# Patient Record
Sex: Female | Born: 1945
Health system: Southern US, Community
[De-identification: ages and names within clinical notes are randomized; demographics above are authoritative.]

## PROBLEM LIST (undated history)

## (undated) DIAGNOSIS — Z87442 Personal history of urinary calculi: Secondary | ICD-10-CM

## (undated) DIAGNOSIS — F419 Anxiety disorder, unspecified: Secondary | ICD-10-CM

## (undated) DIAGNOSIS — G629 Polyneuropathy, unspecified: Secondary | ICD-10-CM

## (undated) DIAGNOSIS — E785 Hyperlipidemia, unspecified: Secondary | ICD-10-CM

## (undated) DIAGNOSIS — N840 Polyp of corpus uteri: Secondary | ICD-10-CM

## (undated) DIAGNOSIS — K219 Gastro-esophageal reflux disease without esophagitis: Secondary | ICD-10-CM

## (undated) DIAGNOSIS — G35 Multiple sclerosis: Secondary | ICD-10-CM

## (undated) DIAGNOSIS — R42 Dizziness and giddiness: Secondary | ICD-10-CM

## (undated) DIAGNOSIS — H04123 Dry eye syndrome of bilateral lacrimal glands: Secondary | ICD-10-CM

## (undated) DIAGNOSIS — R17 Unspecified jaundice: Secondary | ICD-10-CM

## (undated) DIAGNOSIS — I509 Heart failure, unspecified: Secondary | ICD-10-CM

## (undated) DIAGNOSIS — G35D Multiple sclerosis, unspecified: Secondary | ICD-10-CM

## (undated) DIAGNOSIS — I639 Cerebral infarction, unspecified: Secondary | ICD-10-CM

## (undated) DIAGNOSIS — I2510 Atherosclerotic heart disease of native coronary artery without angina pectoris: Secondary | ICD-10-CM

## (undated) DIAGNOSIS — M3500 Sicca syndrome, unspecified: Secondary | ICD-10-CM

## (undated) DIAGNOSIS — I10 Essential (primary) hypertension: Secondary | ICD-10-CM

## (undated) DIAGNOSIS — Z8719 Personal history of other diseases of the digestive system: Secondary | ICD-10-CM

## (undated) DIAGNOSIS — T7840XA Allergy, unspecified, initial encounter: Secondary | ICD-10-CM

## (undated) DIAGNOSIS — M1990 Unspecified osteoarthritis, unspecified site: Secondary | ICD-10-CM

## (undated) DIAGNOSIS — N951 Menopausal and female climacteric states: Secondary | ICD-10-CM

## (undated) DIAGNOSIS — H539 Unspecified visual disturbance: Secondary | ICD-10-CM

## (undated) HISTORY — DX: Essential (primary) hypertension: I10

## (undated) HISTORY — DX: Heart failure, unspecified: I50.9

## (undated) HISTORY — PX: UPPER GI ENDOSCOPY: SHX6162

## (undated) HISTORY — DX: Hyperlipidemia, unspecified: E78.5

## (undated) HISTORY — DX: Multiple sclerosis, unspecified: G35.D

## (undated) HISTORY — DX: Polyneuropathy, unspecified: G62.9

## (undated) HISTORY — DX: Multiple sclerosis: G35

## (undated) HISTORY — DX: Menopausal and female climacteric states: N95.1

## (undated) HISTORY — DX: Anxiety disorder, unspecified: F41.9

## (undated) HISTORY — DX: Atherosclerotic heart disease of native coronary artery without angina pectoris: I25.10

## (undated) HISTORY — DX: Unspecified osteoarthritis, unspecified site: M19.90

## (undated) HISTORY — DX: Allergy, unspecified, initial encounter: T78.40XA

## (undated) HISTORY — DX: Unspecified visual disturbance: H53.9

---

## 1999-07-22 HISTORY — PX: LUMBAR FUSION: SHX111

## 1999-07-24 ENCOUNTER — Other Ambulatory Visit: Admission: RE | Admit: 1999-07-24 | Discharge: 1999-07-24 | Payer: Self-pay | Admitting: *Deleted

## 1999-08-12 ENCOUNTER — Observation Stay (HOSPITAL_COMMUNITY): Admission: RE | Admit: 1999-08-12 | Discharge: 1999-08-13 | Payer: Self-pay | Admitting: Neurosurgery

## 1999-11-06 ENCOUNTER — Encounter: Admission: RE | Admit: 1999-11-06 | Discharge: 2000-02-04 | Payer: Self-pay | Admitting: Neurosurgery

## 1999-12-28 ENCOUNTER — Ambulatory Visit (HOSPITAL_COMMUNITY): Admission: RE | Admit: 1999-12-28 | Discharge: 1999-12-28 | Payer: Self-pay | Admitting: Neurosurgery

## 2000-01-17 ENCOUNTER — Encounter: Admission: RE | Admit: 2000-01-17 | Discharge: 2000-01-17 | Payer: Self-pay | Admitting: Neurosurgery

## 2000-07-21 HISTORY — PX: ABDOMINAL HYSTERECTOMY: SHX81

## 2001-02-25 ENCOUNTER — Other Ambulatory Visit
Admission: RE | Admit: 2001-02-25 | Discharge: 2001-02-25 | Payer: Self-pay | Admitting: Physical Medicine & Rehabilitation

## 2001-03-17 ENCOUNTER — Encounter: Payer: Self-pay | Admitting: Obstetrics and Gynecology

## 2001-03-17 ENCOUNTER — Encounter: Admission: RE | Admit: 2001-03-17 | Discharge: 2001-03-17 | Payer: Self-pay | Admitting: Obstetrics and Gynecology

## 2001-05-06 ENCOUNTER — Encounter: Payer: Self-pay | Admitting: Obstetrics and Gynecology

## 2001-05-06 ENCOUNTER — Ambulatory Visit (HOSPITAL_COMMUNITY): Admission: RE | Admit: 2001-05-06 | Discharge: 2001-05-06 | Payer: Self-pay | Admitting: Obstetrics and Gynecology

## 2001-05-25 ENCOUNTER — Ambulatory Visit (HOSPITAL_COMMUNITY): Admission: RE | Admit: 2001-05-25 | Discharge: 2001-05-25 | Payer: Self-pay | Admitting: Neurology

## 2001-06-08 ENCOUNTER — Inpatient Hospital Stay (HOSPITAL_COMMUNITY): Admission: RE | Admit: 2001-06-08 | Discharge: 2001-06-09 | Payer: Self-pay | Admitting: Obstetrics and Gynecology

## 2001-06-08 ENCOUNTER — Encounter (INDEPENDENT_AMBULATORY_CARE_PROVIDER_SITE_OTHER): Payer: Self-pay | Admitting: Specialist

## 2001-06-08 ENCOUNTER — Encounter (INDEPENDENT_AMBULATORY_CARE_PROVIDER_SITE_OTHER): Payer: Self-pay

## 2002-02-28 ENCOUNTER — Other Ambulatory Visit
Admission: RE | Admit: 2002-02-28 | Discharge: 2002-02-28 | Payer: Self-pay | Admitting: Physical Medicine & Rehabilitation

## 2002-03-09 ENCOUNTER — Ambulatory Visit (HOSPITAL_COMMUNITY): Admission: RE | Admit: 2002-03-09 | Discharge: 2002-03-09 | Payer: Self-pay | Admitting: Obstetrics and Gynecology

## 2002-03-09 ENCOUNTER — Encounter: Payer: Self-pay | Admitting: Obstetrics and Gynecology

## 2002-04-13 ENCOUNTER — Ambulatory Visit: Admission: RE | Admit: 2002-04-13 | Discharge: 2002-04-13 | Payer: Self-pay | Admitting: Gynecology

## 2002-07-06 ENCOUNTER — Ambulatory Visit: Admission: RE | Admit: 2002-07-06 | Discharge: 2002-07-06 | Payer: Self-pay | Admitting: Gynecology

## 2002-09-07 ENCOUNTER — Encounter: Payer: Self-pay | Admitting: Obstetrics and Gynecology

## 2002-09-07 ENCOUNTER — Encounter: Admission: RE | Admit: 2002-09-07 | Discharge: 2002-09-07 | Payer: Self-pay | Admitting: Obstetrics and Gynecology

## 2003-09-15 ENCOUNTER — Ambulatory Visit (HOSPITAL_COMMUNITY): Admission: RE | Admit: 2003-09-15 | Discharge: 2003-09-15 | Payer: Self-pay | Admitting: Obstetrics and Gynecology

## 2003-10-11 ENCOUNTER — Ambulatory Visit: Admission: RE | Admit: 2003-10-11 | Discharge: 2003-10-11 | Payer: Self-pay | Admitting: Gynecology

## 2004-02-20 ENCOUNTER — Encounter: Admission: RE | Admit: 2004-02-20 | Discharge: 2004-04-03 | Payer: Self-pay | Admitting: Family Medicine

## 2004-03-19 ENCOUNTER — Encounter: Admission: RE | Admit: 2004-03-19 | Discharge: 2004-06-17 | Payer: Self-pay | Admitting: Family Medicine

## 2006-02-26 ENCOUNTER — Encounter: Admission: RE | Admit: 2006-02-26 | Discharge: 2006-02-26 | Payer: Self-pay | Admitting: Obstetrics and Gynecology

## 2007-03-31 LAB — HM COLONOSCOPY

## 2007-05-28 ENCOUNTER — Encounter: Admission: RE | Admit: 2007-05-28 | Discharge: 2007-05-28 | Payer: Self-pay | Admitting: Obstetrics and Gynecology

## 2008-04-14 ENCOUNTER — Encounter: Admission: RE | Admit: 2008-04-14 | Discharge: 2008-04-14 | Payer: Self-pay | Admitting: Family Medicine

## 2008-10-27 ENCOUNTER — Encounter: Admission: RE | Admit: 2008-10-27 | Discharge: 2008-10-27 | Payer: Self-pay | Admitting: Family Medicine

## 2009-03-23 ENCOUNTER — Encounter: Admission: RE | Admit: 2009-03-23 | Discharge: 2009-03-23 | Payer: Self-pay | Admitting: Family Medicine

## 2009-04-09 LAB — CONVERTED CEMR LAB: Pap Smear: NORMAL

## 2009-07-21 HISTORY — PX: OTHER SURGICAL HISTORY: SHX169

## 2009-08-27 ENCOUNTER — Emergency Department (HOSPITAL_COMMUNITY): Admission: EM | Admit: 2009-08-27 | Discharge: 2009-08-27 | Payer: Self-pay | Admitting: Family Medicine

## 2009-08-31 ENCOUNTER — Ambulatory Visit: Payer: Self-pay | Admitting: Internal Medicine

## 2009-08-31 DIAGNOSIS — E785 Hyperlipidemia, unspecified: Secondary | ICD-10-CM | POA: Insufficient documentation

## 2009-08-31 DIAGNOSIS — J309 Allergic rhinitis, unspecified: Secondary | ICD-10-CM | POA: Insufficient documentation

## 2009-08-31 DIAGNOSIS — R42 Dizziness and giddiness: Secondary | ICD-10-CM | POA: Insufficient documentation

## 2009-08-31 DIAGNOSIS — G35 Multiple sclerosis: Secondary | ICD-10-CM | POA: Insufficient documentation

## 2009-08-31 LAB — CONVERTED CEMR LAB
ALT: 21 units/L (ref 0–35)
AST: 19 units/L (ref 0–37)
Eosinophils Relative: 1 % (ref 0.0–5.0)
GFR calc non Af Amer: 90.21 mL/min (ref >60)
Glucose, Urine, Semiquant: NEGATIVE
HCT: 41.8 % (ref 36.0–46.0)
Hemoglobin: 14.2 g/dL (ref 12.0–15.0)
Ketones, urine, test strip: NEGATIVE
Lymphs Abs: 0.8 10*3/uL (ref 0.7–4.0)
Monocytes Relative: 5.5 % (ref 3.0–12.0)
Neutro Abs: 4.3 10*3/uL (ref 1.4–7.7)
Platelets: 182 10*3/uL (ref 150.0–400.0)
Potassium: 4.1 meq/L (ref 3.5–5.1)
RBC: 4.52 M/uL (ref 3.87–5.11)
Sodium: 142 meq/L (ref 135–145)
Specific Gravity, Urine: 1.01
TSH: 3.07 microintl units/mL (ref 0.35–5.50)
Total Bilirubin: 0.4 mg/dL (ref 0.3–1.2)
WBC: 5.5 10*3/uL (ref 4.5–10.5)
pH: 7.5

## 2009-09-01 ENCOUNTER — Encounter: Payer: Self-pay | Admitting: Internal Medicine

## 2009-09-03 ENCOUNTER — Ambulatory Visit: Payer: Self-pay | Admitting: Cardiology

## 2009-09-04 ENCOUNTER — Encounter: Payer: Self-pay | Admitting: Internal Medicine

## 2009-09-04 ENCOUNTER — Telehealth: Payer: Self-pay | Admitting: Internal Medicine

## 2009-09-04 DIAGNOSIS — N200 Calculus of kidney: Secondary | ICD-10-CM | POA: Insufficient documentation

## 2009-09-05 ENCOUNTER — Ambulatory Visit: Payer: Self-pay | Admitting: Internal Medicine

## 2009-09-05 DIAGNOSIS — M5126 Other intervertebral disc displacement, lumbar region: Secondary | ICD-10-CM | POA: Insufficient documentation

## 2009-09-14 ENCOUNTER — Encounter: Payer: Self-pay | Admitting: Internal Medicine

## 2009-10-01 ENCOUNTER — Encounter: Payer: Self-pay | Admitting: Internal Medicine

## 2009-10-03 ENCOUNTER — Ambulatory Visit: Payer: Self-pay | Admitting: Internal Medicine

## 2009-10-03 DIAGNOSIS — B0229 Other postherpetic nervous system involvement: Secondary | ICD-10-CM | POA: Insufficient documentation

## 2009-10-11 ENCOUNTER — Telehealth: Payer: Self-pay | Admitting: Internal Medicine

## 2009-11-27 ENCOUNTER — Telehealth: Payer: Self-pay | Admitting: Internal Medicine

## 2009-11-29 ENCOUNTER — Ambulatory Visit: Payer: Self-pay | Admitting: Internal Medicine

## 2009-12-04 ENCOUNTER — Ambulatory Visit: Payer: Self-pay | Admitting: Internal Medicine

## 2009-12-05 ENCOUNTER — Encounter: Payer: Self-pay | Admitting: Internal Medicine

## 2009-12-05 LAB — CONVERTED CEMR LAB
ALT: 19 units/L (ref 0–35)
Bilirubin, Direct: 0.1 mg/dL (ref 0.0–0.3)
Calcium: 9.1 mg/dL (ref 8.4–10.5)
Chloride: 107 meq/L (ref 96–112)
Creatinine, Ser: 0.6 mg/dL (ref 0.4–1.2)
GFR calc non Af Amer: 99.95 mL/min (ref >60)
LDL Cholesterol: 136 mg/dL — ABNORMAL HIGH (ref 0–99)
Nitrite: NEGATIVE
Specific Gravity, Urine: 1.025 (ref 1.000–1.030)
Total Bilirubin: 0.5 mg/dL (ref 0.3–1.2)
Total CHOL/HDL Ratio: 5
Total Protein, Urine: NEGATIVE mg/dL
Triglycerides: 130 mg/dL (ref 0.0–149.0)
pH: 5.5 (ref 5.0–8.0)

## 2010-04-01 ENCOUNTER — Encounter: Payer: Self-pay | Admitting: Internal Medicine

## 2010-06-20 ENCOUNTER — Ambulatory Visit (HOSPITAL_COMMUNITY): Admission: RE | Admit: 2010-06-20 | Discharge: 2010-06-20 | Payer: Self-pay | Admitting: Internal Medicine

## 2010-08-20 NOTE — Assessment & Plan Note (Signed)
Summary: [facility]   Vital Signs:  Patient profile:   65 year old female Height:      62 inches Weight:      166 pounds BMI:     30.47 O2 Sat:      99 % on Room air Temp:     97.0 degrees F oral Pulse rate:   72 / minute Pulse rhythm:   regular Resp:     16 per minute BP sitting:   110 / 80  (left arm) Cuff size:   large  Vitals Entered By: Rock Nephew CMA (August 31, 2009 1:26 PM)  Nutrition Counseling: Patient's BMI is greater than 25 and therefore counseled on weight management options.  O2 Flow:  Room air  Primary Care Provider:  Etta Grandchild MD   History of Present Illness: New to me she complains of a 3 week hx of hematuria and intermittent right lower flank pain. She was seen at [facility] on 02/01 and was given Cipro which caused diarrhea. She then went to an [facility] on 02/07 and was given pyridium and diflucan.  Preventive Screening-Counseling & Management  Alcohol-Tobacco     Alcohol drinks/day: <1     Alcohol type: all     >5/day in last 3 mos: no     Alcohol Counseling: not indicated; use of alcohol is not excessive or problematic     Feels need to cut down: no     Feels annoyed by complaints: no     Feels guilty re: drinking: no     Needs 'eye opener' in am: no     Smoking Status: never  Caffeine-Diet-Exercise     Does Patient Exercise: no  Hep-HIV-STD-Contraception     Hepatitis Risk: no risk noted     HIV Risk: no risk noted     STD Risk: no risk noted      Sexual History:  not active.        Drug Use:  no.        Blood Transfusions:  no.    Medications Prior to Update: 1)  None  Current Medications (verified): 1)  Betaseron 0.3 Mg Solr (Interferon Beta-1b) .... Alternate Days 2)  Fexofenadine Hcl 180 Mg Tabs (Fexofenadine Hcl) .... Take 1 Tablet By Mouth Once A Day 3)  Estrace 0.5 Mg Tabs (Estradiol) .... Alternate Daily Between 0.5 and 0.1mg  4)  Clonazepam 0.5 Mg Tabs (Clonazepam) .... Take 1 Tablet By Mouth Once A  Day 5)  Colace 100 Mg Caps (Docusate Sodium) .... Daily 6)  Provigil 200 Mg Tabs (Modafinil) .... As Needed 7)  Meloxicam 7.5 Mg Tabs (Meloxicam) .... As Needed 8)  Tramadol Hcl 50 Mg Tabs (Tramadol Hcl) .... As Needed 9)  Multivitamin 10)  Calcium 600 + D 11)  Fish Oil 1000mg  12)  Ex Strength Tylenol .... 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  The patient  denies anorexia, fever, weight loss, weight gain, chest pain, dyspnea on exertion, peripheral edema, prolonged cough, hemoptysis, abdominal pain, melena, hematochezia, severe indigestion/heartburn, incontinence, genital sores, suspicious skin lesions, depression, and enlarged lymph nodes.   GI:  Complains of abdominal pain, constipation, diarrhea, and nausea; denies bloody stools, dark tarry stools, gas, indigestion, loss of appetite, vomiting, vomiting blood, and yellowish skin color. GU:  Complains of hematuria; denies abnormal vaginal bleeding, discharge, dysuria, incontinence, nocturia, urinary frequency, and urinary hesitancy.  Physical Exam  General:  alert, well-developed, well-nourished, well-hydrated, appropriate dress, normal appearance, healthy-appearing, and cooperative to examination.   Head:  normocephalic, atraumatic, no abnormalities observed, and no abnormalities palpated.   Eyes:  pink moist mm., no icterus Ears:   R ear normal and L ear normal.   Mouth:  Oral mucosa and oropharynx without lesions or exudates.  Teeth in good repair. Neck:  supple, full ROM, no masses, no thyromegaly, no JVD, no carotid bruits, and no cervical lymphadenopathy.   Lungs:  Normal respiratory effort, chest expands symmetrically. Lungs are clear to auscultation, no crackles or wheezes. Heart:  Normal rate and regular rhythm. S1 and S2 normal without gallop, murmur, click, rub or other extra sounds. Abdomen:  soft, non-tender, no distention, no masses, no guarding, no rigidity, no rebound tenderness, no abdominal hernia, no hepatomegaly, no splenomegaly, and bowel sounds hypoactive.   Msk:  No deformity or scoliosis noted of thoracic or lumbar spine.   Pulses:  R and L carotid,radial,femoral,dorsalis pedis and posterior tibial pulses are full and equal bilaterally Extremities:  No clubbing, cyanosis, edema, or deformity noted with normal full range of motion of all joints.   Neurologic:  alert & oriented X3, cranial nerves II-XII intact, abnormal gait, RUE hyperreflexia, RLE hyperreflexia, RLE weakness, LUE hyperreflexia, LUE weakness, LLE hyperreflexia, and LLE weakness.     Impression & Recommendations:  Problem # 1:  DIZZINESS (ICD-780.4) Assessment New  Her updated medication list for this problem includes:    Fexofenadine Hcl 180 Mg Tabs (Fexofenadine hcl) .[name] Kitchen... Take 1 tablet by mouth once a day  Orders: Venipuncture (57846) TLB-BMP (Basic Metabolic Panel-BMET) (80048-METABOL) TLB-CBC Platelet - w/Differential (85025-CBCD) TLB-Hepatic/Liver Function Pnl (80076-HEPATIC) TLB-TSH (Thyroid Stimulating Hormone) (84443-TSH) T-Urine Culture (Spectrum Order) ([id number]) TLB-Lipase (83690-LIPASE) TLB-Sedimentation Rate (ESR) (85652-ESR)  Problem # 2:  GROSS HEMATURIA (ICD-599.71) Assessment: New will culture to look for infection, she will increase fluid intake Orders: Venipuncture (24401) TLB-BMP (Basic Metabolic  Panel-BMET) (80048-METABOL) TLB-CBC Platelet - w/Differential (85025-CBCD) TLB-Hepatic/Liver Function Pnl (80076-HEPATIC) TLB-TSH (Thyroid Stimulating Hormone) (84443-TSH) T-Urine Culture (Spectrum Order) ([id number]) TLB-Lipase (83690-LIPASE) TLB-Sedimentation Rate (ESR) (85652-ESR) UA Dipstick W/ Micro (manual) (03474) Radiology Referral (Radiology)  Problem # 3:  FLANK PAIN, RIGHT (ICD-789.09) Assessment: New i think she may have a kidney stone so will start the workup, she did not want anything for pain today. Her updated medication list for this problem includes:    Meloxicam 7.5 Mg Tabs (Meloxicam) .[name] Kitchen... As needed    Tramadol Hcl 50 Mg Tabs (Tramadol hcl) .[name] Kitchen... As needed  Orders: Venipuncture (25956) TLB-BMP (Basic Metabolic Panel-BMET) (80048-METABOL) TLB-CBC Platelet - w/Differential (85025-CBCD) TLB-Hepatic/Liver Function Pnl (80076-HEPATIC) TLB-TSH (Thyroid Stimulating Hormone) (84443-TSH) T-Urine Culture (Spectrum Order) [id number]) TLB-Lipase (83690-LIPASE) TLB-Sedimentation Rate (ESR) (85652-ESR) UA Dipstick W/ Micro (manual) (51884) Radiology Referral (Radiology)  Complete Medication List: 1)  Betaseron 0.3 Mg Solr (Interferon beta-1b) .... Alternate days 2)  Fexofenadine Hcl 180 Mg Tabs (Fexofenadine hcl) .... Take 1 tablet by mouth once a day 3)  Estrace 0.5 Mg Tabs (  Estradiol) .... Alternate daily between 0.5 and 0.1mg  4)  Clonazepam 0.5 Mg Tabs (Clonazepam) .... Take 1 tablet by mouth once a day 5)  Colace 100 Mg Caps (Docusate sodium) .... Daily 6)  Provigil 200 Mg Tabs (Modafinil) .... As needed 7)  Meloxicam 7.5 Mg Tabs (Meloxicam) .... As needed 8)  Tramadol Hcl 50 Mg Tabs (Tramadol hcl) .... As needed 9)  Multivitamin  10)  Calcium 600 + D  11)  Fish Oil 1000mg   12)  Ex Strength Tylenol  .... As needed  Colorectal Screening:  Colonoscopy Results:    Date of Exam: 03/31/2007    Results: Adenomatous Polyp  PAP Screening:    Hx Cervical  Dysplasia in last 5 yrs? No    3 normal PAP smears in last 5 yrs? Yes    Last PAP smear:  04/09/2009  PAP Smear Results:    Date of Exam:  04/09/2009    Results:  Normal  Mammogram Screening:    Last Mammogram:  04/10/2009  Mammogram Results:    Date of Exam:  04/10/2009    Results:  Normal Bilateral  Osteoporosis Risk Assessment:  Risk Factors for Fracture or Low Bone Density:   Race (White or Asian):     yes   Smoking status:       never  Immunization & Chemoprophylaxis:    Influenza vaccine: Historical  (03/21/2009)    Pneumovax: Historical  (03/21/2009)  Patient Instructions: 1)  Please schedule a follow-up appointment in 3-5 days.    Immunization History:  Influenza Immunization History:    Influenza:  historical (03/21/2009)  Pneumovax Immunization History:    Pneumovax:  historical (03/21/2009)   Laboratory Results   Urine Tests  Date/Time Received: Rock Nephew CMA  August 31, 2009 1:43 PM   Routine Urinalysis   Color: lt. yellow Appearance: Clear Glucose: negative   (Normal Range: Negative) Bilirubin: negative   (Normal Range: Negative) Ketone: negative   (Normal Range: Negative) Spec. Gravity: 1.010   (Normal Range: 1.003-1.035) Blood: trace-lysed   (Normal Range: Negative) pH: 7.5   (Normal Range: 5.0-8.0) Protein: negative   (Normal Range: Negative) Urobilinogen: 0.2   (Normal Range: 0-1) Nitrite: negative   (Normal Range: Negative) Leukocyte Esterace: negative   (Normal Range: Negative)

## 2010-08-20 NOTE — Letter (Signed)
Summary: Results Follow-up Letter  [facility]  [address] [place], [place] [address]   Phone: [phone]  Fax: [phone]    12/05/2009  [address] [place], [place]  [address]  Dear Ms. Whatley,   The following are the results of your recent test(s):  Test     Result     Liver/kidney   normal Thyroid     normal  _________________________________________________________  Please call for an appointment as directed _________________________________________________________ _________________________________________________________ _________________________________________________________  Sincerely,  Sanda Linger MD [facility]

## 2010-08-20 NOTE — Assessment & Plan Note (Signed)
Summary: PER PT ZOSTAVAX--STC  Nurse Visit   Vitals Entered By: Rock Nephew CMA (Nov 29, 2009 9:45 AM) CC: zostavax injection   Allergies: No Known Drug Allergies  Immunizations Administered:  Zostavax # 1:    Vaccine Type: Zostavax    Site: right deltoid    Mfr: Merck    Dose: 0.65    Route: [place]    Given by: Rock Nephew CMA    Exp. Date: 11/25/2010    Lot #: [id number]    VIS given: 05/02/05 given Nov 29, 2009.  Orders Added: 1)  Zoster (Shingles) Vaccine Live [90736] 2)  Admin 1st Vaccine [phone]

## 2010-08-20 NOTE — Assessment & Plan Note (Signed)
Summary: FU ON KIDNEY STONES PER [facility]  #   Vital Signs:  Patient profile:   65 year old female Height:      62 inches Weight:      171.50 pounds BMI:     31.48 O2 Sat:      97 % on Room air Temp:     97.2 degrees F oral Pulse rate:   71 / minute Pulse rhythm:   regular Resp:     16 per minute BP sitting:   106 / 64  (left arm) Cuff size:   large  Vitals Entered By: Rock Nephew CMA (October 03, 2009 9:34 AM)  Nutrition Counseling: Patient's BMI is greater than 25 and therefore counseled on weight management options.  O2 Flow:  Room air CC: follow-up visit Is Patient Diabetic? No   Primary Care Provider:  Etta Grandchild MD  CC:  follow-up visit.  History of Present Illness: She returns with a new complaint, she developed a painful rash on her right/lateral/upper chest wall 2 weeks ago. The rash has resolved and she has brown spots there now but the area stills hurts with a pain that she describes as a "twitchy spasm" that worsens at night when she is trying to sleep.  She has been seen by Dr. Vernie Ammons (Urology) and she says that a CTscan, urine tests, and cystoscopy have all been negative for cancer but they decided her hematuria was from the stones and that no further treamtent should be considered at this time. She says that she was instructed to notify them of any symptoms.  Preventive Screening-Counseling & Management  Alcohol-Tobacco     Alcohol drinks/day: <1     Alcohol type: all     >5/day in last 3 mos: no     Alcohol Counseling: not indicated; use of alcohol is not excessive or problematic     Feels need to cut down: no     Feels annoyed by complaints: no     Feels guilty re: drinking: no     Needs 'eye opener' in am: no     Smoking Status: never  Hep-HIV-STD-Contraception     Hepatitis Risk: no risk noted     HIV Risk: no risk noted     STD Risk: no risk noted      Sexual History:  not active.        Drug Use:  no.        Blood  Transfusions:  no.    Clinical Review Panels:  CBC   WBC:  5.5 (08/31/2009)   RBC:  4.52 (08/31/2009)   Hgb:  14.2 (08/31/2009)   Hct:  41.8 (08/31/2009)   Platelets:  182.0 (08/31/2009)   MCV  92.5 (08/31/2009)   MCHC  33.9 (08/31/2009)   RDW  13.6 (08/31/2009)   PMN:  78.7 (08/31/2009)   Lymphs:  14.8 (08/31/2009)   Monos:  5.5 (08/31/2009)   Eosinophils:  1.0 (08/31/2009)   Basophil:  0.0 (08/31/2009)  Complete Metabolic Panel   Glucose:  93 (08/31/2009)   Sodium:  142 (08/31/2009)   Potassium:  4.1 (08/31/2009)   Chloride:  106 (08/31/2009)   CO2:  30 (08/31/2009)   BUN:  14 (08/31/2009)   Creatinine:  0.7 (08/31/2009)   Albumin:  4.1 (08/31/2009)   Total Protein:  6.8 (08/31/2009)   Calcium:  9.4 (08/31/2009)   Total Bili:  0.4 (08/31/2009)   Alk Phos:  91 (08/31/2009)   SGPT (ALT):  21 (08/31/2009)  SGOT (AST):  19 (08/31/2009)   Medications Prior to Update: 1)  Betaseron 0.3 Mg Solr (Interferon Beta-1b) .... Alternate Days 2)  Fexofenadine Hcl 180 Mg Tabs (Fexofenadine Hcl) .... Take 1 Tablet By Mouth Once A Day 3)  Estrace 0.5 Mg Tabs (Estradiol) .... Alternate Daily Between 0.5 and 0.1mg  4)  Clonazepam 0.5 Mg Tabs (Clonazepam) .... Take 1 Tablet By Mouth Once A Day 5)  Colace 100 Mg Caps (Docusate Sodium) .... Daily 6)  Provigil 200 Mg Tabs (Modafinil) .... As Needed 7)  Meloxicam 7.5 Mg Tabs (Meloxicam) .... As Needed 8)  Tramadol Hcl 50 Mg Tabs (Tramadol Hcl) .... As Needed 9)  Multivitamin 10)  Calcium 600 + D 11)  Fish Oil 1000mg  12)  Ex Strength Tylenol .... As Needed  Current Medications (verified): 1)  Betaseron 0.3 Mg Solr (Interferon Beta-1b) .... Alternate Days 2)  Fexofenadine Hcl 180 Mg Tabs (Fexofenadine Hcl) .... Take 1 Tablet By Mouth Once A Day 3)  Estrace 0.5 Mg Tabs (Estradiol) .... Alternate Daily Between 0.5 and 0.1mg  4)  Clonazepam 0.5 Mg Tabs (Clonazepam) .... Take 1 Tablet By Mouth Once A Day 5)  Colace 100 Mg Caps (Docusate  Sodium) .... Daily 6)  Provigil 200 Mg Tabs (Modafinil) .... As Needed 7)  Meloxicam 7.5 Mg Tabs (Meloxicam) .... As Needed 8)  Tramadol Hcl 50 Mg Tabs (Tramadol Hcl) .... As Needed 9)  Multivitamin 10)  Calcium 600 + D 11)  Fish Oil 1000mg  12)  Ex Strength Tylenol .... As Needed 13)  Lyrica 50 Mg Caps (Pregabalin) .... One By Mouth Three Times A Day As Needed For Shingles Pain  Allergies (verified): No Known Drug Allergies  Past History:  Past Medical History: Reviewed history from 08/31/2009 and no changes required. MS Allergic rhinitis Hyperlipidemia  Past Surgical History: Reviewed history from 08/31/2009 and no changes required. Hysterectomy Lumbar fusion  Family History: Reviewed history from 08/31/2009 and no changes required. 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  The patient denies anorexia, fever, weight loss, prolonged cough, hemoptysis, abdominal pain, enlarged lymph nodes, and angioedema.   General:  Denies chills, fatigue, fever, loss of appetite, malaise, sleep disorder, sweats, weakness, and weight loss. GU:  Denies dysuria, hematuria, incontinence, nocturia, urinary frequency, and urinary hesitancy. Derm:  Complains of lesion(s) and rash; denies changes in color of skin, changes in nail beds, dryness, excessive perspiration, flushing, insect bite(s), itching, and poor wound healing.  Physical Exam  General:  alert, well-developed, well-nourished, well-hydrated, appropriate dress, normal appearance, healthy-appearing, and cooperative to examination.   Head:  normocephalic, atraumatic, no abnormalities observed, and no abnormalities palpated.   Mouth:  Oral mucosa and oropharynx without lesions or  exudates.  Teeth in good repair. Neck:  supple, full ROM, no masses, no thyromegaly, no JVD, no carotid bruits, and no cervical lymphadenopathy.   Lungs:  Normal respiratory effort, chest expands symmetrically. Lungs are clear to auscultation, no crackles or wheezes. Heart:  Normal rate and regular rhythm. S1 and S2 normal without gallop, murmur, click, rub or other extra sounds. Abdomen:  soft, non-tender, no distention, no masses, no guarding, no rigidity, no rebound tenderness, no abdominal hernia, no hepatomegaly, no splenomegaly, and bowel sounds hypoactive.   Msk:  No deformity or scoliosis noted of thoracic  or lumbar spine.   Pulses:  R and L carotid,radial,femoral,dorsalis pedis and posterior tibial pulses are full and equal bilaterally Extremities:  No clubbing, cyanosis, edema, or deformity noted with normal full range of motion of all joints.   Neurologic:  alert & oriented X3, cranial nerves II-XII intact, abnormal gait, RUE hyperreflexia, RLE hyperreflexia, RLE weakness, LUE hyperreflexia, LUE weakness, LLE hyperreflexia, and LLE weakness.   Skin:  On her right upper/lateral chest wall she has a group of slightly hyperpigmented papules with some coalescence that follow a dermatomal pattern. there is no evidence of secondary infection. Cervical Nodes:  no anterior cervical adenopathy and no posterior cervical adenopathy.   Axillary Nodes:  no R axillary adenopathy and no L axillary adenopathy.   Inguinal Nodes:  no R inguinal adenopathy and no L inguinal adenopathy.   Psych:  Cognition and judgment appear intact. Alert and cooperative with normal attention span and concentration. No apparent delusions, illusions, hallucinations   Impression & Recommendations:  Problem # 1:  POSTHERPETIC NEURALGIA (ICD-053.19) Assessment New try Lyrica  Problem # 2:  RENAL CALCULUS, RECURRENT (ICD-592.0) Assessment: Unchanged  Complete Medication List: 1)  Betaseron 0.3 Mg Solr (Interferon  beta-1b) .... Alternate days 2)  Fexofenadine Hcl 180 Mg Tabs (Fexofenadine hcl) .... Take 1 tablet by mouth once a day 3)  Estrace 0.5 Mg Tabs (Estradiol) .... Alternate daily between 0.5 and 0.1mg  4)  Clonazepam 0.5 Mg Tabs (Clonazepam) .... Take 1 tablet by mouth once a day 5)  Colace 100 Mg Caps (Docusate sodium) .... Daily 6)  Provigil 200 Mg Tabs (Modafinil) .... As needed 7)  Meloxicam 7.5 Mg Tabs (Meloxicam) .... As needed 8)  Tramadol Hcl 50 Mg Tabs (Tramadol hcl) .... As needed 9)  Multivitamin  10)  Calcium 600 + D  11)  Fish Oil 1000mg   12)  Ex Strength Tylenol  .... As needed 13)  Lyrica 50 Mg Caps (Pregabalin) .... One by mouth three times a day as needed for shingles pain  Patient Instructions: 1)  Please schedule a follow-up appointment in 1 month. 2)  Drink at least two extra quarts of water every day to prevent kidney stone formation. Prescriptions: LYRICA 50 MG CAPS (PREGABALIN) One by mouth three times a day as needed for shingles pain  #63 x 0   Entered and Authorized by:   Etta Grandchild MD   Signed by:   Etta Grandchild MD on 10/03/2009   Method used:   Samples Given   RxID:   [id number]

## 2010-08-20 NOTE — Assessment & Plan Note (Signed)
Summary: FU ON RESULTS ON KIDNEY STONES/NWS   #   Vital Signs:  Patient profile:   65 year old female Height:      62 inches Weight:      169 pounds BMI:     31.02 O2 Sat:      97 % on Room air Temp:     97.2 degrees F oral Pulse rate:   67 / minute Pulse rhythm:   regular Resp:     16 per minute BP sitting:   98 / 56  (left arm) Cuff size:   large  Vitals Entered By: Rock Nephew CMA (September 05, 2009 9:41 AM)  Nutrition Counseling: Patient's BMI is greater than 25 and therefore counseled on weight management options.  O2 Flow:  Room air CC: test result follow up   Primary Care Provider:  Etta Grandchild MD  CC:  test result follow up.  History of Present Illness: She returns for f/up and has persistent pain but moreso in the right thigh than elsewhere and unchanged LBP with no new N/W/T in her legs. The hematuria has resolved. The CT scan showed small bilat. renal stones and a bulging disc at L4L5.  Preventive Screening-Counseling & Management  Alcohol-Tobacco     Alcohol drinks/day: <1     Alcohol type: all     >5/day in last 3 mos: no     Alcohol Counseling: not indicated; use of alcohol is not excessive or problematic     Feels need to cut down: no     Feels annoyed by complaints: no     Feels guilty re: drinking: no     Needs 'eye opener' in am: no     Smoking Status: never  Hep-HIV-STD-Contraception     Hepatitis Risk: no risk noted     HIV Risk: no risk noted     STD Risk: no risk noted  Clinical Review Panels:  Diabetes Management   Creatinine:  0.7 (08/31/2009)   Last Flu Vaccine:  Historical (03/21/2009)   Last Pneumovax:  Historical (03/21/2009)  CBC   WBC:  5.5 (08/31/2009)   RBC:  4.52 (08/31/2009)   Hgb:  14.2 (08/31/2009)   Hct:  41.8 (08/31/2009)   Platelets:  182.0 (08/31/2009)   MCV  92.5 (08/31/2009)   MCHC  33.9 (08/31/2009)   RDW  13.6 (08/31/2009)   PMN:  78.7 (08/31/2009)   Lymphs:  14.8 (08/31/2009)   Monos:  5.5  (08/31/2009)   Eosinophils:  1.0 (08/31/2009)   Basophil:  0.0 (08/31/2009)  Complete Metabolic Panel   Glucose:  93 (08/31/2009)   Sodium:  142 (08/31/2009)   Potassium:  4.1 (08/31/2009)   Chloride:  106 (08/31/2009)   CO2:  30 (08/31/2009)   BUN:  14 (08/31/2009)   Creatinine:  0.7 (08/31/2009)   Albumin:  4.1 (08/31/2009)   Total Protein:  6.8 (08/31/2009)   Calcium:  9.4 (08/31/2009)   Total Bili:  0.4 (08/31/2009)   Alk Phos:  91 (08/31/2009)   SGPT (ALT):  21 (08/31/2009)   SGOT (AST):  19 (08/31/2009)   Current Medications (verified): 1)  Betaseron 0.3 Mg Solr (Interferon Beta-1b) .... Alternate Days 2)  Fexofenadine Hcl 180 Mg Tabs (Fexofenadine Hcl) .... Take 1 Tablet By Mouth Once A Day 3)  Estrace 0.5 Mg Tabs (Estradiol) .... Alternate Daily Between 0.5 and 0.1mg  4)  Clonazepam 0.5 Mg Tabs (Clonazepam) .... Take 1 Tablet By Mouth Once A Day 5)  Colace 100 Mg Caps (Docusate  Sodium) .... Daily 6)  Provigil 200 Mg Tabs (Modafinil) .... As Needed 7)  Meloxicam 7.5 Mg Tabs (Meloxicam) .... As Needed 8)  Tramadol Hcl 50 Mg Tabs (Tramadol Hcl) .... As Needed 9)  Multivitamin 10)  Calcium 600 + D 11)  Fish Oil 1000mg  12)  Ex Strength Tylenol .... As Needed  Allergies (verified): No Known Drug Allergies  Past History:  Past Medical History: Reviewed history from 08/31/2009 and no changes required. MS Allergic rhinitis Hyperlipidemia  Past Surgical History: Reviewed history from 08/31/2009 and no changes required. Hysterectomy Lumbar fusion  Family History: Reviewed history from 08/31/2009 and no changes required. 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 Retired Conservation officer, nature Never Smoked Alcohol use-yes Drug use-no Regular exercise-no  Review of Systems       The patient complains of abdominal pain.  The patient denies anorexia, fever, weight loss, chest  pain, hemoptysis, hematuria, muscle weakness, suspicious skin lesions, difficulty walking, and enlarged lymph nodes.   General:  Denies chills, fatigue, fever, loss of appetite, and malaise. GU:  Denies discharge, dysuria, hematuria, nocturia, urinary frequency, and urinary hesitancy. MS:  Complains of low back pain; denies joint pain, joint redness, and joint swelling.  Physical Exam  General:  alert, well-developed, well-nourished, well-hydrated, appropriate dress, normal appearance, healthy-appearing, and cooperative to examination.   Head:  normocephalic, atraumatic, no abnormalities observed, and no abnormalities palpated.   Mouth:  Oral mucosa and oropharynx without lesions or exudates.  Teeth in good repair. Neck:  supple, full ROM, no masses, no thyromegaly, no JVD, no carotid bruits, and no cervical lymphadenopathy.   Lungs:  Normal respiratory effort, chest expands symmetrically. Lungs are clear to auscultation, no crackles or wheezes. Heart:  Normal rate and regular rhythm. S1 and S2 normal without gallop, murmur, click, rub or other extra sounds. Abdomen:  soft, non-tender, no distention, no masses, no guarding, no rigidity, no rebound tenderness, no abdominal hernia, no hepatomegaly, no splenomegaly, and bowel sounds hypoactive.   Msk:  No deformity or scoliosis noted of thoracic or lumbar spine.   Pulses:  R and L carotid,radial,femoral,dorsalis pedis and posterior tibial pulses are full and equal bilaterally Extremities:  No clubbing, cyanosis, edema, or deformity noted with normal full range of motion of all joints.   Neurologic:  alert & oriented X3, cranial nerves II-XII intact, abnormal gait, RUE hyperreflexia, RLE hyperreflexia, RLE weakness, LUE hyperreflexia, LUE weakness, LLE hyperreflexia, and LLE weakness.   Skin:  Intact without suspicious lesions or rashes Axillary Nodes:  no R axillary adenopathy and no L axillary adenopathy.   Inguinal Nodes:  no R inguinal adenopathy  and no L inguinal adenopathy.   Psych:  Cognition and judgment appear intact. Alert and cooperative with normal attention span and concentration. No apparent delusions, illusions, hallucinations   Impression & Recommendations:  Problem # 1:  RENAL CALCULUS, RECURRENT (ICD-592.0) Assessment Improved UROL. 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 Alternate days 2)  Fexofenadine Hcl 180 Mg Tabs (Fexofenadine hcl) .... Take 1 tablet by mouth once a day 3)  Estrace 0.5 Mg Tabs (Estradiol) .... Alternate daily between 0.5 and 0.1mg  4)  Clonazepam 0.5 Mg Tabs (Clonazepam) .... Take 1 tablet by mouth once a day 5)  Colace 100 Mg Caps (Docusate sodium) .... Daily 6)  Provigil 200 Mg Tabs (Modafinil) .... As needed 7)  Meloxicam 7.5 Mg Tabs (Meloxicam) .... As needed 8)  Tramadol Hcl 50 Mg Tabs (Tramadol hcl) .... As needed 9)  Multivitamin  10)  Calcium 600 + D  11)  Fish Oil 1000mg   12)  Ex Strength Tylenol  .... As needed  Patient Instructions: 1)  Please schedule a follow-up appointment in 3 months. 2)  Drink at least two extra quarts of water every day to prevent kidney stone formation.

## 2010-08-20 NOTE — Assessment & Plan Note (Signed)
Summary: 3 MONTH FOLLOW UP-LB   Vital Signs:  Patient profile:   65 year old female Height:      62 inches Weight:      166 pounds BMI:     30.47 O2 Sat:      98 % on Room air Temp:     97.1 degrees F oral Pulse rate:   67 / minute Pulse rhythm:   regular Resp:     16 per minute BP sitting:   108 / 60  (left arm)  Vitals Entered By: Lamar Sprinkles, CMA (Dec 04, 2009 9:49 AM)  Nutrition Counseling: Patient's BMI is greater than 25 and therefore counseled on weight management options.  O2 Flow:  Room air CC: f/u - would like to discuss cholesterol. Has had joint pain w/crestor in the past, not currently on medication for cholesterol.  Comments Patient unsure of last tetanus. Need to review records from previous PCP.    Primary Care Provider:  Etta Grandchild MD  CC:  f/u - would like to discuss cholesterol. Has had joint pain w/crestor in the past and not currently on medication for cholesterol. [name] Kitchen  History of Present Illness: She returns for f/up and wants to see if she needs to take a statin. She has tried Crestor in the past and felt poorly on it with myalgias and arthralgias. She does not fell like she has any extra risk factors for MI or CVA.  Current Medications (verified): 1)  Betaseron 0.3 Mg Solr (Interferon Beta-1b) .... Alternate Days 2)  Fexofenadine Hcl 180 Mg Tabs (Fexofenadine Hcl) .... Take 1 Tablet By Mouth Once A Day 3)  Estrace 0.5 Mg Tabs (Estradiol) .... Every Other Day 4)  Clonazepam 0.5 Mg Tabs (Clonazepam) .... Take 1 Tablet By Mouth Once A Day 5)  Colace 100 Mg Caps (Docusate Sodium) .... Daily 6)  Provigil 200 Mg Tabs (Modafinil) .... 1/2 To 1 Daily As Needed 7)  Meloxicam 7.5 Mg Tabs (Meloxicam) .[name] Kitchen.. 1 Once Daily As Needed 8)  Tramadol Hcl 50 Mg Tabs (Tramadol Hcl) .... Three Times A Day As Needed 9)  Multivitamin 10)  Calcium 600 + D 11)  Fish Oil 1000mg  12)  Ex Strength Tylenol .... As Needed  Allergies (verified): No Known Drug  Allergies  Past History:  Past Medical History: Reviewed history from 08/31/2009 and no changes required. MS Allergic rhinitis Hyperlipidemia  Past Surgical History: Reviewed history from 08/31/2009 and no changes required. Hysterectomy Lumbar fusion  Family History: Reviewed history from 08/31/2009 and no changes required. 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   GU:  Denies dysuria, hematuria, incontinence, nocturia, urinary frequency, and urinary hesitancy.  Physical Exam  General:  alert, well-developed, well-nourished, well-hydrated, appropriate dress, normal appearance, healthy-appearing, and cooperative to examination.   Head:  normocephalic, atraumatic, no abnormalities observed, and no abnormalities palpated.   Mouth:  Oral mucosa and oropharynx without lesions or exudates.  Teeth in good repair. Neck:  supple, full ROM, no masses, no thyromegaly, no JVD, no carotid bruits, and no cervical lymphadenopathy.   Lungs:  Normal respiratory effort, chest expands symmetrically. Lungs are clear to auscultation, no crackles or wheezes. Heart:  Normal rate and regular  rhythm. S1 and S2 normal without gallop, murmur, click, rub or other extra sounds. Abdomen:  soft, non-tender, no distention, no masses, no guarding, no rigidity, no rebound tenderness, no abdominal hernia, no hepatomegaly, no splenomegaly, and bowel sounds hypoactive.   Msk:  No deformity or scoliosis noted of thoracic or lumbar spine.   Pulses:  R and L carotid,radial,femoral,dorsalis pedis and posterior tibial pulses are  full and equal bilaterally Extremities:  No clubbing, cyanosis, edema, or deformity noted with normal full range of motion of all joints.   Neurologic:  alert & oriented X3, cranial nerves II-XII intact, abnormal gait, RUE hyperreflexia, RLE hyperreflexia, RLE weakness, LUE hyperreflexia, LUE weakness, LLE hyperreflexia, and LLE weakness.   Skin:  On her right upper/lateral chest wall she has a group of slightly hyperpigmented papules with some coalescence that follow a dermatomal pattern. there is no evidence of secondary infection. Cervical Nodes:  no anterior cervical adenopathy and no posterior cervical adenopathy.   Axillary Nodes:  no R axillary adenopathy and no L axillary adenopathy.   Psych:  Cognition and judgment appear intact. Alert and cooperative with normal attention span and concentration. No apparent delusions, illusions, hallucinations   Impression & Recommendations:  Problem # 1:  ENCOUNTER FOR LONG-TERM USE OF OTHER MEDICATIONS (ICD-V58.69) Assessment Unchanged  Orders: Venipuncture (41324) TLB-Lipid Panel (80061-LIPID) TLB-BMP (Basic Metabolic Panel-BMET) (80048-METABOL) TLB-Hepatic/Liver Function Pnl (80076-HEPATIC) TLB-TSH (Thyroid Stimulating Hormone) (84443-TSH) TLB-Udip w/ Micro (81001-URINE)  Problem # 2:  HYPERLIPIDEMIA (ICD-272.4) Assessment: Unchanged  Orders: Venipuncture (40102) TLB-Lipid Panel (80061-LIPID) TLB-BMP (Basic Metabolic Panel-BMET) (80048-METABOL) TLB-Hepatic/Liver Function Pnl (80076-HEPATIC) TLB-TSH (Thyroid Stimulating Hormone) (84443-TSH) TLB-Udip w/ Micro (81001-URINE)  Problem # 3:  RENAL CALCULUS, RECURRENT (ICD-592.0) Assessment: Improved  Orders: Venipuncture (72536) TLB-Lipid Panel (80061-LIPID) TLB-BMP (Basic Metabolic Panel-BMET) (80048-METABOL) TLB-Hepatic/Liver Function Pnl (80076-HEPATIC) TLB-TSH (Thyroid Stimulating Hormone) (84443-TSH) TLB-Udip w/ Micro (81001-URINE)  Complete Medication List: 1)  Betaseron 0.3  Mg Solr (Interferon beta-1b) .... Alternate days 2)  Fexofenadine Hcl 180 Mg Tabs (Fexofenadine hcl) .... Take 1 tablet by mouth once a day 3)  Estrace 0.5 Mg Tabs (Estradiol) .... Every other day 4)  Clonazepam 0.5 Mg Tabs (Clonazepam) .... Take 1 tablet by mouth once a day 5)  Colace 100 Mg Caps (Docusate sodium) .... Daily 6)  Provigil 200 Mg Tabs (Modafinil) .... 1/2 to 1 daily as needed 7)  Meloxicam 7.5 Mg Tabs (Meloxicam) .[name] Kitchen.. 1 once daily as needed 8)  Tramadol Hcl 50 Mg Tabs (Tramadol hcl) .... Three times a day as needed 9)  Multivitamin  10)  Calcium 600 + D  11)  Fish Oil 1000mg   12)  Ex Strength Tylenol  .... As needed  Other Orders: Tdap => 22yrs IM (64403) Admin 1st Vaccine (47425)  Patient Instructions: 1)  Please schedule a follow-up appointment in 2 months. 2)  It is important that you exercise regularly at least 20 minutes 5 times a week. If you develop chest pain, have severe difficulty breathing, or feel very tired , stop exercising immediately and seek medical attention. 3)  You need to lose weight. Consider a lower calorie diet and regular exercise.  4)  Drink at least two extra quarts of water every day to prevent kidney stone formation.   Immunizations Administered:  Tetanus Vaccine:    Vaccine Type: Tdap    Site: right deltoid    Mfr: GlaxoSmithKline    Dose: 0.5 ml    Route: IM    Given by: Lucious Groves    Exp. Date:  10/13/2011    Lot #: [id number]    VIS given: 06/08/07 version given Dec 04, 2009.

## 2010-08-20 NOTE — Letter (Signed)
Summary: [facility]  [facility]   Imported By: Lennie Odor 04/08/2010 16:14:09  _____________________________________________________________________  External Attachment:    Type:   Image     Comment:   External Document

## 2010-08-20 NOTE — Letter (Signed)
Summary: Lipid Letter  [facility]  [address] [place], [place] [address]   Phone: [phone]  Fax: [phone]    12/05/2009  Dawn [name] [address] [place], [place]  [address]  Dear Dawn [name]:  We have carefully reviewed your last lipid profile from 12/05/2009 and the results are noted below with a summary of recommendations for lipid management.    Cholesterol:       199     Goal: <200   HDL "good" Cholesterol:   57.84     Goal: >40   LDL "bad" Cholesterol:   136     Goal: <130   Triglycerides:       130.0     Goal: <150    these results are acceptable, at this point I do not recommend a statin.    TLC Diet (Therapeutic Lifestyle Change): Saturated Fats & Transfatty acids should be kept < 7% of total calories ***Reduce Saturated Fats Polyunstaurated Fat can be up to 10% of total calories Monounsaturated Fat Fat can be up to 20% of total calories Total Fat should be no greater than 25-35% of total calories Carbohydrates should be 50-60% of total calories Protein should be approximately 15% of total calories Fiber should be at least 20-30 grams a day ***Increased fiber may help lower LDL Total Cholesterol should be < 200mg /day Consider adding plant stanol/sterols to diet (example: Benacol spread) ***A higher intake of unsaturated fat may reduce Triglycerides and Increase HDL    Adjunctive Measures (may lower LIPIDS and reduce risk of Heart Attack) include: Aerobic Exercise (20-30 minutes 3-4 times a week) Limit Alcohol Consumption Weight Reduction Aspirin 75-81 mg a day by mouth (if not allergic or contraindicated) Dietary Fiber 20-30 grams a day by mouth     Current Medications: 1)    Betaseron 0.3 Mg Solr (Interferon beta-1b) .... Alternate days 2)    Fexofenadine Hcl 180 Mg Tabs (Fexofenadine hcl) .... Take 1 tablet by mouth once a day 3)    Estrace 0.5 Mg Tabs (Estradiol) .... Every other day 4)    Clonazepam 0.5 Mg Tabs  (Clonazepam) .... Take 1 tablet by mouth once a day 5)    Colace 100 Mg Caps (Docusate sodium) .... Daily 6)    Provigil 200 Mg Tabs (Modafinil) .... 1/2 to 1 daily as needed 7)    Meloxicam 7.5 Mg Tabs (Meloxicam) .[name] Kitchen.. 1 once daily as needed 8)    Tramadol Hcl 50 Mg Tabs (Tramadol hcl) .... Three times a day as needed 9)    Multivitamin  10)    Calcium 600 + D  11)    Fish Oil 1000mg   12)    Ex Strength Tylenol  .... As needed  If you have any questions, please call. We appreciate being able to work with you.   Sincerely,    [facility] Etta Grandchild MD

## 2010-08-20 NOTE — Letter (Signed)
Summary: [facility]   Imported By: Sherian Rein 10/12/2009 10:16:19  _____________________________________________________________________  External Attachment:    Type:   Image     Comment:   External Document

## 2010-08-20 NOTE — Progress Notes (Signed)
Summary: Lyrica  Phone Note Call from Patient Call back at Home Phone [phone]   Summary of Call: Patient states that Lyrica is to strong. Patient would like to know if there is an alternative? Patient also inquired about vaccine. (none available at this time). Initial call taken by: Lucious Groves,  October 11, 2009 1:23 PM  Follow-up for Phone Call        done Follow-up by: Etta Grandchild MD,  October 11, 2009 1:25 PM  Additional Follow-up for Phone Call Additional follow up Details #1::        Patient notified. Patient also made aware that Zosta vaccine is on back order from the company. Additional Follow-up by: Lucious Groves,  October 11, 2009 1:29 PM    New/Updated Medications: NEURONTIN 100 MG CAPS (GABAPENTIN) One by mouth three times a day for nerve pain Prescriptions: NEURONTIN 100 MG CAPS (GABAPENTIN) One by mouth three times a day for nerve pain  #90 x 3   Entered and Authorized by:   Etta Grandchild MD   Signed by:   Etta Grandchild MD on 10/11/2009   Method used:   Electronically to        [facility]  [address] [phone] (retail)       [address]       [place], [place]  [address]       Ph: [phone] or [phone]       Fax: [phone]   RxID:   [id number]

## 2010-08-20 NOTE — Progress Notes (Signed)
Summary: SHINGLES VACC  Phone Note Call from Patient Call back at [facility] Phone [phone]   Summary of Call: Patient has MS and wants to know if it is ok to shingles vaccine.  Initial call taken by: Lamar Sprinkles, CMA,  Nov 27, 2009 2:26 PM  Follow-up for Phone Call        yes Follow-up by: Etta Grandchild MD,  Nov 28, 2009 7:38 AM  Additional Follow-up for Phone Call Additional follow up Details #1::        Pt informed  Additional Follow-up by: Lamar Sprinkles, CMA,  Nov 28, 2009 10:43 AM

## 2010-08-20 NOTE — Letter (Signed)
Summary: Results Follow-up Letter  [facility]  [address] [place], [place] [address]   Phone: [phone]  Fax: [phone]    09/04/2009  [address] [place], [place]  [address]  Dear Ms. Waln,   The following are the results of your recent test(s):  Test     Result     Ct scan     small stones on both sides _________________________________________________________  Please call for an appointment as directed _________________________________________________________ _________________________________________________________ _________________________________________________________  Sincerely,  Sanda Linger MD [facility]

## 2010-08-20 NOTE — Consult Note (Signed)
Summary: [facility]  [facility]   Imported By: Lester  09/21/2009 11:19:11  _____________________________________________________________________  External Attachment:    Type:   Image     Comment:   External Document

## 2010-08-20 NOTE — Progress Notes (Signed)
Summary: RESULTS  Phone Note Call from Patient   Summary of Call: Patient is requesting a call regarding CT  Initial call taken by: Lamar Sprinkles, CMA,  September 04, 2009 9:39 AM  Follow-up for Phone Call        Patient notified and would like to know what she can be doing in order to not have any symptoms, and what the plan of treatment is. Please advise. Follow-up by: Lucious Groves,  September 04, 2009 9:52 AM  Additional Follow-up for Phone Call Additional follow up Details #1::        she will need to see a urologist Additional Follow-up by: Etta Grandchild MD,  September 04, 2009 10:04 AM  New Problems: RENAL CALCULUS, RECURRENT (ICD-592.0)   Additional Follow-up for Phone Call Additional follow up Details #2::    Patient notified and will await call from [facility]. Follow-up by: Lucious Groves,  September 04, 2009 11:44 AM  New Problems: RENAL CALCULUS, RECURRENT (ICD-592.0)

## 2010-10-11 LAB — POCT URINALYSIS DIP (DEVICE)
Glucose, UA: NEGATIVE mg/dL
Nitrite: NEGATIVE
Protein, ur: NEGATIVE mg/dL
Urobilinogen, UA: 0.2 mg/dL (ref 0.0–1.0)

## 2010-12-04 ENCOUNTER — Encounter: Payer: Self-pay | Admitting: Physician Assistant

## 2010-12-06 NOTE — Op Note (Signed)
[facility]  Patient:    Dawn [name], Dawn [name] Visit Number: [id number] MRN: [id number]          Service Type: GYN Location: 9300 [id number] Attending Physician:  Madelyn Flavors Dictated by:   Beather Arbour Thomasena Edis, M.D. Admit Date:  06/08/2001   CC:         Genene Churn. Love, M.D.   Operative Report  PREOPERATIVE DIAGNOSES:       Left palpable adnexal mass with papillary excrescences and mural nodules.  POSTOPERATIVE DIAGNOSES:      Mature teratoma.  PROCEDURE:                    Exploratory laparotomy, left salpingo-oophorectomy, removal of possible infarcted appendices epiploicae.  COMPLICATIONS:                None.  DRAINS:                       Foley.  Patient does have a history of hematuria due to beta carotene.  SURGEON:                      Beather Arbour. Thomasena Edis, M.D.  ASSISTANT:                    Ronda Fairly. Galen Daft, M.D.  ESTIMATED BLOOD LOSS:         Minimal.  ANESTHESIA:                   General endotracheal.  FLUIDS:                       Approximately 2000 cc of Crystalloid.  DESCRIPTION OF PROCEDURE:     The patient was brought to the operating room, identified on the operating room table.  After induction of adequate general endotracheal anesthesia patient was placed in the supine position and prepped and draped in the usual sterile fashion.  A Foley catheter was placed. Patient is known to have hematuria.  A small Pfannenstiel incision was made and carried down to the fascia.  Fascia was scored in the midline, extended bilaterally using Mayo scissors.  It was then separated free from the underlying muscles.  The muscles were separated in the midline down to the symphysis.  With the patient in Trendelenburg the perineum was entered sharply and carefully taking care to avoid bowel or other ______ contents.  The peritoneal incision was extended superiorly, then inferiorly down to the bladder edge.  Washings were taken.  Exploration  of the abdomen revealed there to be no enlarged pelvic or periaortic nodes.  Liver edge and all perineal surfaces were smooth.  Uterus was noted to be very mobile with just a ceiling fibroid.  Right ovary was normal.  Left ovary was noted to be smaller than on ultrasound.  Was noted to be free with no external excrescences.  The left tube and ovary were double clamped with Heaney clamps, cut, and excised and sent to pathology for frozen section.  The infundibulopelvic ligament pedicle was tied with a free tie of 0 Vicryl and then with a suture ligature of 0 Vicryl.  There was noted to be a slight amount of oozing and the raw edge was grasped with a right angle and tied with a tie of 0 Vicryl.  Excellent hemostasis was noted.  There was noted to be a possible infarcted appendices epiploicae  and this was removed, sent to pathology for examination.  It is thought that due to its close proximity to the ovary this may have given the appearance as noted on the ultrasound.  It should be noted that this was removed with the cutting portion of the Bovie.  This was well away from any bowel.  Great care was taken to keep all bowel loops out of the operative field.  The ureter was identified on the left and noted to be peristalsing freely.  However, no pedicles were noted to be near the ureter.  At that point the procedure was terminated.  The patient was taken out of Trendelenburg. The peritoneal incision was closed using a simple running suture of 2-0 Monocryl.  The subfascial areas were examined for evidence of hemostasis and excellent hemostasis was achieved using cautery.  The fascia was closed using two sutures of 0 PDS with each anchored at either angle of the incision, ______ to the midline and tied.  The subcutaneous tissue was irrigated copiously with warm lactated Ringers and hemostasis was achieved using cautery.  The skin was closed with a subcuticular stitch of 4-0 undyed Vicryl and  Steri-Strips were applied.  Patient was noted to have hematuria but this was noted at the beginning of the case.  We were well away from the bladder. Patient had hematuria.  She had not ever mentioned this to me, though, in any of her visits.  It should be noted that the patient was recently diagnosed with MS and anesthesia was notified in advance regarding this.  All precautions were taken during her anesthesia to decrease her risk of any sort of MS exacerbation. Dictated by:   Beather Arbour Thomasena Edis, M.D. Attending Physician:  Madelyn Flavors DD:  06/08/01 TD:  06/08/01 Job: [id number]

## 2010-12-06 NOTE — Consult Note (Signed)
   NAME:  Dawn [name], Dawn [name]                       ACCOUNT NO.:  [id number]   MEDICAL RECORD NO.:  [id number]                   PATIENT TYPE:  OUT   LOCATION:  GYN                                  FACILITY:  [facility]   PHYSICIAN:  De Blanch, M.D.         DATE OF BIRTH:  05-04-1946   DATE OF CONSULTATION:  07/06/2002  DATE OF DISCHARGE:  07/06/2002                                   CONSULTATION   REASON FOR CONSULTATION:  A 65 year old white female returns for follow-up  of a vaginal mass.  I initially saw her on April 13, 2002 and felt that  the mass was most likely a fibroma but asked her to return to see me if she  had any problems or for follow-up at today's scheduled appointment.  The  patient denies any vaginal symptoms whatsoever.   PHYSICAL EXAMINATION:  VITAL SIGNS:  Weight 162 pounds, blood pressure  122/72.  ABDOMEN:  Soft, nontender.  No mass, organomegaly, ascites, or hernias  noted.  PELVIC:  EG/BUS is normal.  Palpation of the left anterior vagina at  approximately 1 to 2 o'clock reveals a smooth, submucosal nodule measuring  approximately 2-3 cm in diameter.  This is unchanged from prior examination.   IMPRESSION:  Probable vaginal fibroma.  I recommend the patient be followed  for this rather than attempt surgical resection, as it is asymptomatic and  unchanged and I think likely benign, versus the risks of surgical  intervention.  We will ask her to return to see her primary gynecologist,  Dr. Artist Pais, in approximately six months for a recheck, or sooner if  she develops any symptoms.                                               De Blanch, M.D.    DC/MEDQ  D:  07/20/2002  T:  07/20/2002  Job:  [id number]   cc:   Artist Pais, M.D.  [address], [address]  [place]  [place] [address]  Fax: [phone]   Telford Nab, R.N.  [address] [place], [place] [address]  Fax: 1

## 2010-12-06 NOTE — Consult Note (Signed)
NAME:  Dawn [name], Dawn [name]                       ACCOUNT NO.:  aq   MEDICAL RECORD NO.:  [id number]                   PATIENT TYPE:  OUT   LOCATION:  GYN                                  FACILITY:  [facility]   PHYSICIAN:  De Blanch, M.D.         DATE OF BIRTH:  23-Jul-1945   DATE OF CONSULTATION:  10/11/2003  DATE OF DISCHARGE:                                   CONSULTATION   REASON FOR CONSULTATION:  [name] 65 year old white female returns at the request  of Dr. Artist Pais regarding [name] vaginal mass.  I initially saw the patient  in September 2003, at which time the mass had clinically measured 2 x 3 cm.  Ultrasound at about that same time revealed [name] mass to measure 3 x 1.8 x 2.8  cm.  The patient has been followed by Dr. Thomasena Edis in the interval with the  mass being stable.  Most recently, the mass was re-evaluated by ultrasound  and it measured 3 x 2.2 x 2.4 cm, this is essentially stable.  They did  notice that there was internal blood flow to the mass.  The patient is  entirely asymptomatic.   PHYSICAL EXAMINATION:  VITAL SIGNS:  Weight 180 pounds.  ABDOMEN:  Soft and nontender.  No mass, organomegaly, ascites, or hernias  are noted.  PELVIC:  EGBUS, vagina, bladder, and urethra are normal, but on palpation  there is [name] 2 to 3 cm mass in the left upper vaginal fornix.  This is  essentially unchanged from my prior exam nearly 1-1/2 years ago.   IMPRESSION:  Vaginal mass which is stable, most likely [name] fibroma.  I do not  believe that the recognition of blood flow into the mass raises an increase  in the risk of malignancy, and therefore would recommend the patient be  followed with serial pelvic exams at six month intervals.  She will return  to see Dr. Thomasena Edis.                                               De Blanch, M.D.    DC/MEDQ  D:  10/11/2003  T:  10/12/2003  Job:  [id number]   cc:   Artist Pais, M.D.  [address], [address]  [place]  [place] [address]  Fax: [phone]   Telford Nab, R.N.  [phone] [address]  [place], [place] [address]

## 2010-12-06 NOTE — Consult Note (Signed)
NAME:  Dawn [name], Dawn [name]                       ACCOUNT NO.:  [id number]   MEDICAL RECORD NO.:  [id number]                   PATIENT TYPE:  OUT   LOCATION:  GYN                                  FACILITY:  [facility]   PHYSICIAN:  Daniel L. Clarke-Pearson, M.D.      DATE OF BIRTH:  04-22-1946   DATE OF CONSULTATION:  04/13/2002  DATE OF DISCHARGE:                                   CONSULTATION   HISTORY OF PRESENT ILLNESS:  A 65 year old married white female seen in  consultation at the request of Lafonda Mosses B. Thomasena Edis, M.D., regarding a newly  diagnosed vaginal mass.   The patient's gynecologic history is really unremarkable except for the fact  that she underwent a left salpingo-oophorectomy in November 2002 for a  mature cystic keratoma.  She had an uncomplicated postoperative course.  She  is menopausal and is currently using Activella  with good results.  She has  no hot flashes and denies any vaginal bleeding or discharge.  She has no  past history of abnormal Pap smears.   With regard to the vaginal nodule that has been identified, she has  undergone both ultrasound and MRI to evaluate this.  On ultrasound, the  lesion measures 3 x 1.8 x 2.8 cm in the left vaginal wall.  MRI  characteristics suggest this is a solid mass.   The patient denies any pelvic pain or pressure, any dyspareunia or any other  vaginal symptoms.   PAST MEDICAL HISTORY:  Medical illnesses include multiple sclerosis (stable  at the present time), low back pain status post lumbar laminectomy.   ALLERGIES:  No known drug allergies.   PAST SURGICAL HISTORY:  Exploratory laparotomy in November 2002.  Laminectomy 2001.   CURRENT MEDICATIONS:  Interferon, Allegra, Activella, Provigil, Tylenol,  ibuprofen, multivitamins, and Caltrate.   FAMILY HISTORY:  Negative for gynecologic, breast or colon cancer.   SOCIAL HISTORY:  The patient is an Building control surveyor.  She smokes 10 cigarettes a  day.   REVIEW OF SYSTEMS:   Negative except as noted above.   PHYSICAL EXAMINATION:  GENERAL APPEARANCE:  A well-developed white female  in no acute distress.  HEENT:  Negative.  NECK:  Supple without thyromegaly.  There is no supraclavicular or inguinal  adenopathy.  ABDOMEN:  Soft, nontender.  No masses, organomegaly, ascites or hernias  noted.  She has a well-healed Pfannenstiel incision.  PELVIC:  EGBUS, vagina, bladder and urethra are normal.  The cervix is  normal, although there is an endocervical polyp present. On bimanual  examination, there is a smooth nodule in the left anterior vaginal wall at  approximately one to two o'clock near the cervix.  This feels like a  fibroma.  It is mobile and nontender.  There are no vaginal mucosal lesions.  Rectovaginal examination confirms.   IMPRESSION:  Submucosal vaginal mass most likely a fibroma.  It is noted the  patient does have small fibroids in the uterus based  on ultrasound  characteristics.   I discussed with the patient differential diagnosis.  I strongly suspect  that this is a fibroma and not a malignancy and therefore would suggest it  be followed rather than biopsied or excised.  The risks of excision include  bleeding and bladder or ureteral injury in this location.   The patient is in agreement with this plan.  We will have her return to see  [place] in three months for a repeat examination and base further decisions on  any changes at that time.   (Note:  The nodule on my pelvic examination today feels approximately 2 to 3  cm).                                               Daniel L. Stanford Breed, M.D.    DLC/MEDQ  D:  04/13/2002  T:  04/13/2002  Job:  [id number]   cc:   Lafonda Mosses B. Thomasena Edis, M.D.   Telford Nab, R.N.

## 2010-12-06 NOTE — Op Note (Signed)
[place]  Patient:    Dawn [name]                     MRN: [id number] Proc. Date: 08/12/99 Adm. Date:  [id number] Attending:  Tressie Stalker D                           Operative Report  BRIEF HISTORY:  The patient is a 65 year old white female who suffered from left leg pain since June 2000.  She failed medical management.  She was worked up as an outpatient with a lumbar MRI that demonstrated a herniated nucleus pulposus at L5-S1 on the left.  Her signs and symptoms and physical examination were consistent with a left S1 radiculopathy.  She, therefore, weighed the risks, benefits, and  alternatives of surgery and decided to proceed with a microdiskectomy.  PREOPERATIVE DIAGNOSIS: 1. L5-S1 degenerative disk disease. 2. Herniated nucleus pulposus. 3. Lumbar radiculopathy. 4. Lumbago.  POSTOPERATIVE DIAGNOSIS: 1. L5-S1 degenerative disk disease. 2. Herniated nucleus pulposus. 3. Lumbar radiculopathy. 4. Lumbago.  OPERATION:  Left L5-S1 microdiskectomy using microdissection.  SURGEON:  Cristi Loron, M.D.  ASSISTANT:  Payton Doughty, M.D.  ANESTHESIA:  General endotracheal anesthesia.  ESTIMATED BLOOD LOSS:   Less than 100 cc.  DRAINS:  None.  COMPLICATIONS:  None.  SPECIMENS:  None.  DESCRIPTION OF PROCEDURE:  The patient was brought to the operating room by anesthesia team.  General endotracheal anesthesia was induced.  The patient was  then turned to the prone position on the Wilson frame.  Her lumbosacral region as then prepared with Betadine Scrub and Betadine Solution.  Sterile drapes were applied.  I then injected the area to be incised with Marcaine with epinephrine  solution.  I used the scalpel to make a vertical incision over the patients midline lumbosacral junction.  I used electrocautery to dissect down to the thoracolumbar fascia.  I divided the fascia just to the left of midline and performed a  left-sided subperiosteal dissection, stripping the paraspinous musculature from the left spinous process and lamina of L5 in the upper sacrum. I inserted the McCullough retractor for exposure and then obtained an intraoperative radiograph.  It demonstrated that the Nicholos Johns was in it at L5-S1.  I then brought the operative microscope into the field and, under some magnification and illumination, I completed the microdissection/decompression. I used Midas Rex high-speed drill to drill out the cartilage of the left L5 lamina until I encountered the insertion of the ligamentum flavum.  I then widened the  laminotomy with the Kerrison punch removing the left L5-S1 ligamentum flavum, exposing underlying thecal sac, and I identified the left S1 nerve root.  I performed a foraminotomy about it.  I then freed the nerve root to the thecal sac from the vessels.  Using microdissection and Penfield #4, I was able to gently retract the thecal sac and S1 nerve root medially.  This exposed a moderate size but strategically located disk herniation just beneath the ______  anterior to he left S1 nerve root.  I then incised it to the disk herniation and removed it in  several fragments using the pituitary forceps.  I performed interspace diskectomy using the pituitary forceps and Epstein and Scoville curets. I then used electrocautery to provide hemostasis.  I coagulated the annulus fibrosis.  I then palpated about the thecal sac and S1 nerve root and noted that neural structures were  well decompressed throughout the neural foramen.  I then copiously irrigated the wound with bacitracin solution.  I removed the solution and then removed the McCullough retractor and reapproximated the patients thoracolumbar fascia with interrupted #1 Vicryl and subcutaneous tissue with interrupted 2-0 Vicryl, the kin with Steri-Strips and Benzoin.  The wound was then coated with bacitracin ointment. A sterile  dressing was applied.  The drapes were removed, and the patient was returned to the supine position where he was extubated by the anesthesia team and transported to the postanesthesia care unit in stable condition.  All sponge, instrument, and needle counts were correct at the end of this case.DD: 08/12/99 TD:  08/12/99 Job: [id number]

## 2010-12-06 NOTE — Discharge Summary (Signed)
[facility]  Patient:    Dawn [name], Dawn [name] Visit Number: [id number] MRN: [id number]          Service Type: GYN Location: 9300 [id number] 01 Attending Physician:  Madelyn Flavors Dictated by:   Beather Arbour Thomasena Edis, M.D. Admit Date:  06/08/2001 Discharge Date: 06/09/2001                             Discharge Summary  HISTORY OF PRESENT ILLNESS:   The patient is a 65 year old gravida 2, para 0, Caucasian female admitted for exploratory laparotomy and LSO due to an ovarian mass, which was found to have papillary excrescences and neural nodules. Consultation with Dr. Loree Fee had been undertaken.  For further details, please see the dictated history and physical.  HOSPITAL COURSE:              The patient was admitted and underwent exploratory laparotomy and left salpingo-oophorectomy.  Frozen section revealed this to be a mature cystic teratoma.  Washings had been taken. Patient tolerated the surgery well.  Postoperatively, patient was receiving excellent analgesia.  She stated that she had taken high doses of beta carotene and for that reason, her urine was hemorrhagic.  However, a urinalysis was performed and she was noted to have positive nitrates, positive protein.  Culture and sensitivity was sent.  On postoperative day 1, the patient was feeling better after being treated with Macrodantin and pyridium. Abdomen was soft, nontender, with positive bowel sounds.  She was able to tolerate her diet.  Postoperative hemoglobin was 12.1 and white count was 9200.  The patient was subsequently discharged home after her incision was noted to be clean, dry and intact.  She was given a prescription for Macrobid to take one p.o. b.i.d.  She was also given a prescription for Percocet, dispense 30, to take 1-2 p.o. q.4-6h. as needed for pain.  She is to return to the office in two weeks for an incision check.  The patients culture showed no growth at one day,  which was the final summary, however, it is my opinion that she did have a urinary tract infection. Dictated by:   Beather Arbour Thomasena Edis, M.D. Attending Physician:  Madelyn Flavors DD:  06/23/01 TD:  06/23/01 Job: [id number]

## 2010-12-06 NOTE — Procedures (Signed)
[place]  Patient:    Dawn [name], Dawn [name] Visit Number: [id number] MRN: [id number]          Service Type: OUT Location: MDC Attending Physician:  Erich Montane Dictated by:   Genene Churn. Love, M.D. Admit Date:  05/25/2001                             Procedure Report  DATE OF BIRTH:  [birth year]  CLINICAL INFORMATION:  This patient is being evaluated for numbness.  TECHNICAL DESCRIPTION:  The patient was prepped and draped in the left lateral decubitus position.  Using Betadine and 1% Xylocaine, the L4-L5 interspace was entered without difficulty.  The opening pressure was 150 mm of water, and clear, colorless CSF was obtained and sent for studies.  The patient tolerated the procedure well.  Studies to be done included cell count, differential, protein, glucose, VDRL, angiotensin converting enzyme, ______ P-protein, IgG, and ______ IgG.  Other studies include methomalonic acid and vitamin B12 level. Dictated by:   Genene Churn. Love, M.D. Attending Physician:  Erich Montane DD:  05/25/01 TD:  05/26/01 Job: [id number]

## 2010-12-06 NOTE — H&P (Signed)
[place]. [facility]  Patient:    Dawn [name]                     MRN: [id number] Adm. Date:  [id number] Attending:  Cristi Loron CC:         Chales Salmon. Abigail Miyamoto, M.D.                         History and Physical  CHIEF COMPLAINT:  Left leg pain.  HISTORY OF PRESENT ILLNESS:  The patient is a 65 year old white female who is usually in good health until approximately June 2000, when she began having some pain at the left leg.  She does not recall any specific precipitating event. She was seen by her primary doctor, Dr. Abigail Miyamoto, and was treated with medications including a prednisone taper, Darvocet, etc.  She did not improve much and she as therefore sent for x-rays and lumbar MRI and then kindly sent for my consultation when it demonstrated disk herniation.  The patient states she is gradually getting worse.  She complains primarily of eg pain.  She describes the pain, which begins at her left ______ down the back of  her left leg.  She occasionally has a funny sensation in the left posterior leg and ankle region.  She has not noticed any weakness, ataxia, etc.  She has some mild back pain.  Her leg bothers her more than her back.  She had no physical therapy, injections, etc.  PAST MEDICAL HISTORY:  Positive only for allergies; vertigo, which has been worked up by Dr. Avie Echevaria and apparently an otolaryngologist.  She does not recall his name.  CURRENT MEDICATIONS: 1. Prednisone taper. 2. Darvocet p.r.n. 3. Allegra 60 mg p.r.n. 4. Nasonex p.r.n.  ALLERGIES:  None.  PAST SURGICAL HISTORY:  None.  FAMILY HISTORY:  The patients mother died at age 41 secondary to old age and the patients father at age 47 secondary to thrombosis, apparently cardiac disease. He was injured in World War II.  SOCIAL HISTORY:  The patient is married.  She has no children.  She lives in [place].  She is an Acupuncturist at ______.  She  smokes 1/2 pack per day of cigarettes x approximately 30 years.  I advised her to quit.  She denies drug use.  She occasionally drinks alcohol.  REVIEW OF SYSTEMS:  Negative except as above.  PHYSICAL EXAMINATION:  GENERAL:  A pleasant, well-nourished, well-developed, moderately obese 65 year old white female complaining of left leg pain.  VITAL SIGNS:  Height 5 feet 3 inches.  HEENT:  Normocephalic, atraumatic.  Pupils are equal, round and reactive to light. Extraocular muscles intact.  Sclerae white.  Conjunctivae pink.  Oropharynx benign. Uvula midline.  She was wearing corrective lenses.  NECK:  Supple.  There were no masses, meningis, deformities, tracheal deviation, jugular venous distention, carotid bruits.  She has normal cervical range of motion.  Spurlings test is negative.  Lhermittes sign was not present.  LUNGS:  Clear to auscultation.  HEART:  Regular rate and rhythm.  ABDOMEN:  Soft and nontender.  EXTREMITIES:  No obvious deformities.  BACK:  No point tenderness.  She is diffusely tender to palpation along the lumbosacral junction.  Straight leg raise testing and ______ testing and negative bilaterally.  NEUROLOGIC:  The patient is alert and oriented x 3.  Cranial nerves II-XII are  grossly intact bilaterally.  Vision is grossly normal bilaterally.  Reflective lenses.  Hearing grossly normal bilaterally.  She does complain of some ringing  in her right ear.  Motor strength is 5/5 in her bilateral deltoid, biceps, triceps, hand grip and wrist extensor, interosseous psoas, quadriceps, gastrocnemius, extensor hallucis longus.  Cerebellar exam is intact to rapid alternating movements of the upper extremities bilaterally.  Sensory exam is normal to light touch and pinprick sensation in all tests of dermatomes bilaterally.  Deep tendon reflexes are 2/4 in the bilateral biceps, triceps, brachioradialis, quadriceps, gastrocnemius.  She has bilateral  flexor/plantar reflexes.  No ankle clonus.  IMAGING STUDIES:  The patient had a lumbar MRI performed at [facility] on April 30, 1999.  It demonstrates a normal lumbar lordosis.  She has mild degenerative disk disease at L4-5 with some desiccation of the intervertebral disk and some central disk bulging at L5-S2.  She has mild degenerative disk disease and left-sided herniated nucleus pulposus compressing the left S1 nerve root.  ASSESSMENT AND PLAN:  L5-S1 herniated nucleus pulposus, degenerative disk disease, lumbar radiculopathy, and lumbago.  I have discussed the situation with the patient.  I have reviewed her images with her pointing out the abnormalities. er signs and symptoms of physical exam were consistent with an S1 radiculopathy.  discussed the various treatment options including doing nothing, continue medical management, and surgery.  I described the procedure of an L5-S1 microdiskectomy.  I have shown her surgical models and discussed the risks of surgery.  The patient has weighed the risks, benefits, and alternatives to surgery, and would like to proceed with the operation since she has failed medical management. DD:  08/12/99 TD:  08/12/99 Job: [id number]

## 2010-12-06 NOTE — Discharge Summary (Signed)
[place]. [facility]  Patient:    Dawn [name]                     MRN: [id number] Adm. Date:  [id number] Disc. Date: 08/13/99 Attending:  Tressie Stalker D Dictator:   Cristi Loron, M.D. CC:         Chales Salmon. Abigail Miyamoto, M.D.                           Discharge Summary  HISTORY OF PRESENT ILLNESS:  For full details of this admission please refer to the transcribed History and Physical.  The patient is a 65 year old white female who suffered from left leg pain since  June of 2000.  She failed medical management and was worked up as an outpatient  with a lumbar MRI which demonstrated herniated nucleus pulposus at L5-S1 on the  left.  Her signs, symptoms, and physical examination were consistent with a left S1 radiculopathy.  She therefore weighed the risks and benefits and alternatives to surgery and decided to proceed with microdiskectomy.  For Past Medical History, Past Surgical History, medications prior to admission, drug allergies, Family Medical History, Social History, admission physical examination, imaging studies, assessment and plan, etc., please refer to the transcribed History and Physical.  HOSPITAL COURSE:  I admitted the patient to [facility] on January 22, 001 with a diagnosis of L5-S1 degenerative disease, herniated nucleus pulposus, lumbar radiculopathy, and lumbago.  The day of admission I performed a left L5-S1 microdiskectomy using microdissection.  The surgery went well without complications (for full details of this operation please refer to the transcribed operative note).  The patients postoperative course was essentially unremarkable.  She did have some muscle spasms on the evening of surgery which were well controlled with p.o. Valium.  By postoperative day #1 she was afebrile and vital signs were stable. She was eating well and ambulating well, her wound was healing well without  signs of infection, and she was requesting discharge home.  She was discharged home on August 13, 1999.  DISPOSITION:  The patient was given written discharge instructions and instructed to follow up with me in three weeks.  DISCHARGE MEDICATIONS: 1.  Percocet 5 (#60) 1 to 2 p.o. q.4h. p.r.n. for pain, limit eight per day; no  refills. 2.  Valium 5 mg (#50) 1 p.o. q.6h. p.r.n. muscle spasms; one refill.  FINAL DIAGNOSES: 1.  L5-S1 degenerative disease. 2.  Herniated nucleus pulposus. 3.  Lumbago. 4.  Lumbar radiculopathy.  PROCEDURE PERFORMED:  L5-S1 microdiskectomy using microdissection.DD:  08/13/99 TD:  08/13/99 Job: [id number]

## 2010-12-26 ENCOUNTER — Encounter: Payer: PRIVATE HEALTH INSURANCE | Admitting: Physician Assistant

## 2010-12-26 ENCOUNTER — Other Ambulatory Visit: Payer: Self-pay | Admitting: Physician Assistant

## 2010-12-26 DIAGNOSIS — Z01419 Encounter for gynecological examination (general) (routine) without abnormal findings: Secondary | ICD-10-CM

## 2010-12-27 NOTE — Group Therapy Note (Signed)
NAMESTACYANN, Dawn [name]             ACCOUNT NO.:  [id number]  MEDICAL RECORD NO.:  [id number]           PATIENT TYPE:  A  LOCATION:  [facility]                   FACILITY:  [facility]  PHYSICIAN:  Maylon Cos, CNM    DATE OF BIRTH:  1945-09-06  DATE OF SERVICE:  12/26/2010                                 CLINIC NOTE  The patient has been seen at [facility] at [facility].  REASON FOR TODAY'S VISIT:  Annual exam and Pap smear.  HISTORY OF PRESENT ILLNESS:  The patient is a 65 year old Caucasian female who presents from her primary care provider who is Marcello Moores at [facility] for Pap smear and breast exam.  She has no complaints.  ALLERGIES:  No known drug allergies.  CURRENT MEDICATIONS:  See med list and chart.  Healthcare maintenance is up today for mammogram that was normal in 2012.  GYNECOLOGIC HISTORY:  Her last Pap smear was in 2010 and it was normal.  SURGICAL HISTORY: 1. The patient had a supracervical hysterectomy that was performed at     [facility].  She is unsure of the date more than 10 years ago     secondary to fibroids that were noncancerous. 2. She had disk surgery 2 years ago as well.  She never had a blood     transfusion.  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  She lives alone.  She denies alcohol, drug and tobacco use.  FAMILY HISTORY:  Noncontributory.  PERSONAL MEDICAL HISTORY:  Positive only for MS which is also being currently treated by Dr. Yetta Barre.  PHYSICAL EXAMINATION:  GENERAL:  Ms. Curet is a pleasant 65 year old Caucasian female in no apparent distress. HEENT:  Grossly normal. BREASTS:  Asymmetrical.  Right breast is greater than left.  No retracting.  No dimpling of the skin.  Nipples are erect without discharge.  No masses.  Nontender to palpation. ABDOMEN:  Soft and nontender.  Femoral pulses are equal. PELVIC:  Bimanual exam, the patient is Tanner V.   Mucous membranes are pink and smooth without lesions, slight atrophy.  Cervix is smooth and pink without lesions.  The patient does have a known "lump on the vaginal wall."  She does have a cystic structure that measures approximately 2 cm visually and on palpation as well that is nontender that appears to be stable.  Cervix is nonfriable.  No cervical motion tenderness.  Uterus and ovaries surgically removed. EXTREMITIES:  Reactive x4.  Trace edema in lower extremities.  Pulses are equal and bounding  ASSESSMENT:  Well-woman exam.  PLAN:  Pap smear was obtained and sent for routine.  We will obtain records for mammogram that was done in 2012 for results.  The patient should continue her herbal supplements of Estroven for any vasomotor symptoms that she is having of being a postmenopausal state.  FOLLOWUP:  The patient should follow up in 12 months for an annual exam without Pap smear or p.r.n. problems.          ______________________________ Maylon Cos, CNM    SS/MEDQ  D:  12/26/2010  T:  12/27/2010  Job:  [id number]

## 2010-12-31 LAB — HM PAP SMEAR: HM Pap smear: NORMAL

## 2011-08-26 ENCOUNTER — Other Ambulatory Visit: Payer: Self-pay | Admitting: Internal Medicine

## 2011-08-26 DIAGNOSIS — Z1231 Encounter for screening mammogram for malignant neoplasm of breast: Secondary | ICD-10-CM

## 2011-09-04 ENCOUNTER — Ambulatory Visit: Payer: PRIVATE HEALTH INSURANCE | Admitting: Physician Assistant

## 2011-09-04 ENCOUNTER — Ambulatory Visit (HOSPITAL_COMMUNITY): Payer: PRIVATE HEALTH INSURANCE

## 2011-09-29 ENCOUNTER — Encounter: Payer: PRIVATE HEALTH INSURANCE | Admitting: Internal Medicine

## 2011-10-13 ENCOUNTER — Ambulatory Visit (HOSPITAL_COMMUNITY)
Admission: RE | Admit: 2011-10-13 | Discharge: 2011-10-13 | Disposition: A | Discharge status: Home / Self Care | Payer: PRIVATE HEALTH INSURANCE | Source: Ambulatory Visit | Attending: Internal Medicine | Admitting: Internal Medicine

## 2011-10-13 ENCOUNTER — Ambulatory Visit (INDEPENDENT_AMBULATORY_CARE_PROVIDER_SITE_OTHER): Payer: PRIVATE HEALTH INSURANCE | Admitting: Physician Assistant

## 2011-10-13 ENCOUNTER — Encounter: Payer: Self-pay | Admitting: Physician Assistant

## 2011-10-13 VITALS — BP 129/81 | HR 70 | Temp 98.0°F | Ht 62.0 in | Wt 173.8 lb

## 2011-10-13 DIAGNOSIS — Z1231 Encounter for screening mammogram for malignant neoplasm of breast: Secondary | ICD-10-CM | POA: Insufficient documentation

## 2011-10-13 DIAGNOSIS — R300 Dysuria: Secondary | ICD-10-CM

## 2011-10-13 DIAGNOSIS — N951 Menopausal and female climacteric states: Secondary | ICD-10-CM

## 2011-10-13 HISTORY — DX: Menopausal and female climacteric states: N95.1

## 2011-10-13 LAB — POCT URINALYSIS DIP (DEVICE)
Glucose, UA: NEGATIVE mg/dL
Nitrite: NEGATIVE
Urobilinogen, UA: 0.2 mg/dL (ref 0.0–1.0)
pH: 5.5 (ref 5.0–8.0)

## 2011-10-13 MED ORDER — ESTRADIOL 1 MG PO TABS: 1.0000 mg | ORAL_TABLET | Freq: Every day | ORAL | Status: DC

## 2011-10-13 NOTE — Progress Notes (Signed)
Chief Complaint:  well woman exam   Dawn [name] is  66 y.o. G2P0020.  No LMP recorded. Patient has had a hysterectomy..  She presents complaining of well woman exam  Complaint of increased hot flashes at night interfering with sleep at night. Occasional burning with urination and vaginal irritation. Denies vaginal discharge.  Obstetrical/Gynecological History: OB History    Grav Para Term Preterm Abortions TAB SAB Ect Mult Living   2    2  2    0     Pap: 12/2010: NIL  Past Medical History: Past Medical History  Diagnosis Date  . Allergy   . Multiple sclerosis   . Hot flashes, menopausal 10/13/2011    Estradiol started     Past Surgical History: Past Surgical History  Procedure Date  . Abdominal hysterectomy     Family History: History reviewed. No pertinent family history.  Social History: History  Substance Use Topics  . Smoking status: Never Smoker   . Smokeless tobacco: Not on file  . Alcohol Use: Yes     rarely    Allergies: No Known Allergies  Review of Systems - Negative except what has been reviewed in the HPI  Physical Exam   Blood pressure 129/81, pulse 70, temperature 98 F (36.7 C), temperature source Oral, height 5\' 2"  (1.575 m), weight 173 lb 12.8 oz (78.835 kg).  General: General appearance - alert, well appearing, and in no distress, oriented to person, place, and time and overweight Mental status - alert, oriented to person, place, and time, normal mood, behavior, speech, dress, motor activity, and thought processes, affect appropriate to mood Abdomen - soft, nontender, nondistended, no masses or organomegaly Breasts - breasts appear normal, no suspicious masses, no skin or nipple changes or axillary nodes Focused Gynecological Exam: VULVA: normal appearing vulva with no masses, tenderness or lesions, some atrophy, VAGINA: atrophic, vaginal cyst unchanged from previosu exam, CERVIX: cervical motion tenderness absent, UTERUS: surgically  absent, vaginal cuff well healed, ADNEXA: non palp  Labs: Recent Results (from the past 24 hour(s))  POCT URINALYSIS DIP (DEVICE)   Collection Time   10/13/11  3:31 PM      Component Value Range   Glucose, UA NEGATIVE  NEGATIVE (mg/dL)   Bilirubin Urine MODERATE (*) NEGATIVE    Ketones, ur NEGATIVE  NEGATIVE (mg/dL)   Specific Gravity, Urine 1.010  1.005 - 1.030    Hgb urine dipstick NEGATIVE  NEGATIVE    pH 5.5  5.0 - 8.0    Protein, ur NEGATIVE  NEGATIVE (mg/dL)   Urobilinogen, UA 0.2  0.0 - 1.0 (mg/dL)   Nitrite NEGATIVE  NEGATIVE    Leukocytes, UA SMALL (*) NEGATIVE     Assessment:  Well Woman Exam Hot flashes, menopausal Vaginal irritation/dysuria  Plan: Wet prep and urine culture sent Rx Estriodol 1 mg daily FU 2-3 months for recheck or prn probs Mammogram today  Dawn [name] E. 10/13/2011,4:44 PM

## 2011-10-13 NOTE — Patient Instructions (Signed)

## 2011-10-14 LAB — WET PREP, GENITAL: Trich, Wet Prep: NONE SEEN

## 2011-10-17 ENCOUNTER — Other Ambulatory Visit: Payer: Self-pay | Admitting: Physician Assistant

## 2011-10-17 LAB — URINE CULTURE

## 2011-10-17 MED ORDER — CEPHALEXIN 500 MG PO CAPS: 500.0000 mg | ORAL_CAPSULE | Freq: Four times a day (QID) | ORAL | Status: AC

## 2011-10-20 ENCOUNTER — Telehealth: Payer: Self-pay | Admitting: *Deleted

## 2011-10-20 NOTE — Telephone Encounter (Signed)
Called pt and informed her of +UTI. She will need Rx for antibiotics which has been sent to her pharmacy. Pt voiced understanding.

## 2011-10-20 NOTE — Telephone Encounter (Signed)
Message copied by Jill Side on Mon Oct 20, 2011  2:44 PM ------      Message from: August Luz      Created: Fri Oct 17, 2011  8:30 PM       Keflex sent to pharmacy. Please notify pt

## 2011-11-03 ENCOUNTER — Other Ambulatory Visit (INDEPENDENT_AMBULATORY_CARE_PROVIDER_SITE_OTHER): Payer: PRIVATE HEALTH INSURANCE

## 2011-11-03 ENCOUNTER — Encounter: Payer: Self-pay | Admitting: *Deleted

## 2011-11-03 ENCOUNTER — Ambulatory Visit (INDEPENDENT_AMBULATORY_CARE_PROVIDER_SITE_OTHER): Payer: PRIVATE HEALTH INSURANCE | Admitting: Internal Medicine

## 2011-11-03 VITALS — BP 108/70 | HR 80 | Temp 96.9°F | Resp 16 | Ht 62.0 in | Wt 164.0 lb

## 2011-11-03 DIAGNOSIS — G35 Multiple sclerosis: Secondary | ICD-10-CM

## 2011-11-03 DIAGNOSIS — Z0000 Encounter for general adult medical examination without abnormal findings: Secondary | ICD-10-CM

## 2011-11-03 DIAGNOSIS — E785 Hyperlipidemia, unspecified: Secondary | ICD-10-CM

## 2011-11-03 DIAGNOSIS — K635 Polyp of colon: Secondary | ICD-10-CM | POA: Insufficient documentation

## 2011-11-03 LAB — COMPREHENSIVE METABOLIC PANEL
ALT: 15 U/L (ref 0–35)
AST: 20 U/L (ref 0–37)
CO2: 26 mEq/L (ref 19–32)
Creatinine, Ser: 0.8 mg/dL (ref 0.4–1.2)
GFR: 81.47 mL/min (ref >60.00)
Sodium: 141 mEq/L (ref 135–145)
Total Bilirubin: 0.5 mg/dL (ref 0.3–1.2)
Total Protein: 6.7 g/dL (ref 6.0–8.3)

## 2011-11-03 LAB — CBC WITH DIFFERENTIAL/PLATELET
Basophils Absolute: 0 10*3/uL (ref 0.0–0.1)
Eosinophils Absolute: 0.1 10*3/uL (ref 0.0–0.7)
HCT: 42.1 % (ref 36.0–46.0)
Hemoglobin: 14.2 g/dL (ref 12.0–15.0)
Lymphs Abs: 0.7 10*3/uL (ref 0.7–4.0)
MCHC: 33.6 g/dL (ref 30.0–36.0)
MCV: 91.7 fl (ref 78.0–100.0)
Monocytes Absolute: 0.4 10*3/uL (ref 0.1–1.0)
Monocytes Relative: 7.8 % (ref 3.0–12.0)
Neutro Abs: 3.5 10*3/uL (ref 1.4–7.7)
Platelets: 171 10*3/uL (ref 150.0–400.0)
RDW: 14.5 % (ref 11.5–14.6)

## 2011-11-03 LAB — TSH: TSH: 3.8 u[IU]/mL (ref 0.35–5.50)

## 2011-11-03 LAB — LIPID PANEL
Cholesterol: 203 mg/dL — ABNORMAL HIGH (ref 0–200)
HDL: 44.6 mg/dL (ref >39.00)
Triglycerides: 185 mg/dL — ABNORMAL HIGH (ref 0.0–149.0)

## 2011-11-03 NOTE — Progress Notes (Signed)
  Subjective:    Patient ID: Dawn [name], female    DOB: 1945-09-23, 66 y.o.   MRN: [id number]  Hyperlipidemia This is a chronic problem. The current episode started more than 1 year ago. The problem is uncontrolled. Recent lipid tests were reviewed and are variable. She has no history of chronic renal disease, diabetes, hypothyroidism, liver disease, obesity or nephrotic syndrome. Factors aggravating her hyperlipidemia include no known factors. Pertinent negatives include no chest pain, focal sensory loss, focal weakness, leg pain, myalgias or shortness of breath. Current antihyperlipidemic treatment includes diet change. The current treatment provides moderate improvement of lipids. There are no compliance problems.       Review of Systems  Constitutional: Negative.   HENT: Negative.   Eyes: Negative.   Respiratory: Negative.  Negative for shortness of breath.   Cardiovascular: Negative.  Negative for chest pain.  Gastrointestinal: Negative.   Genitourinary: Negative.   Musculoskeletal: Negative.  Negative for myalgias.  Neurological: Negative.  Negative for focal weakness.  Hematological: Negative.   Psychiatric/Behavioral: Negative.        Objective:   Physical Exam  Vitals reviewed. Constitutional: She is oriented to person, place, and time. She appears well-developed and well-nourished. No distress.  HENT:  Head: Normocephalic and atraumatic.  Mouth/Throat: Oropharynx is clear and moist. No oropharyngeal exudate.  Eyes: Conjunctivae are normal. Right eye exhibits no discharge. Left eye exhibits no discharge. No scleral icterus.  Neck: Normal range of motion. Neck supple. No JVD present. No tracheal deviation present. No thyromegaly present.  Cardiovascular: Normal rate, regular rhythm, normal heart sounds and intact distal pulses.  Exam reveals no gallop and no friction rub.   No murmur heard. Pulmonary/Chest: Effort normal and breath sounds normal. No stridor. No  respiratory distress. She has no wheezes. She has no rales. She exhibits no tenderness.  Abdominal: Soft. Bowel sounds are normal. She exhibits no distension and no mass. There is no tenderness. There is no rebound and no guarding.  Musculoskeletal: Normal range of motion. She exhibits no edema and no tenderness.  Lymphadenopathy:    She has no cervical adenopathy.  Neurological: She is oriented to person, place, and time.  Skin: Skin is warm and dry. No rash noted. She is not diaphoretic. No erythema. No pallor.  Psychiatric: She has a normal mood and affect. Her behavior is normal. Judgment and thought content normal.     Lab Results  Component Value Date   WBC 5.5 08/31/2009   HGB 14.2 08/31/2009   HCT 41.8 08/31/2009   PLT 182.0 08/31/2009   GLUCOSE 71 12/05/2009   CHOL 199 12/05/2009   TRIG 130.0 12/05/2009   HDL 37.40* 12/05/2009   LDLCALC 136* 12/05/2009   ALT 19 12/05/2009   AST 17 12/05/2009   NA 142 12/05/2009   K 4.3 12/05/2009   CL 107 12/05/2009   CREATININE 0.6 12/05/2009   BUN 19 12/05/2009   CO2 28 12/05/2009   TSH 5.22 12/05/2009       Assessment & Plan:

## 2011-11-03 NOTE — Patient Instructions (Signed)
Preventive Care for Adults, Female A healthy lifestyle and preventive care can promote health and wellness. Preventive health guidelines for women include the following key practices.  A routine yearly physical is a good way to check with your caregiver about your health and preventive screening. It is a chance to share any concerns and updates on your health, and to receive a thorough exam.   Visit your dentist for a routine exam and preventive care every 6 months. Brush your teeth twice a day and floss once a day. Good oral hygiene prevents tooth decay and gum disease.   The frequency of eye exams is based on your age, health, family medical history, use of contact lenses, and other factors. Follow your caregiver's recommendations for frequency of eye exams.   Eat a healthy diet. Foods like vegetables, fruits, whole grains, low-fat dairy products, and lean protein foods contain the nutrients you need without too many calories. Decrease your intake of foods high in solid fats, added sugars, and salt. Eat the right amount of calories for you.Get information about a proper diet from your caregiver, if necessary.   Regular physical exercise is one of the most important things you can do for your health. Most adults should get at least 150 minutes of moderate-intensity exercise (any activity that increases your heart rate and causes you to sweat) each week. In addition, most adults need muscle-strengthening exercises on 2 or more days a week.   Maintain a healthy weight. The body mass index (BMI) is a screening tool to identify possible weight problems. It provides an estimate of body fat based on height and weight. Your caregiver can help determine your BMI, and can help you achieve or maintain a healthy weight.For adults 20 years and older:   A BMI below 18.5 is considered underweight.   A BMI of 18.5 to 24.9 is normal.   A BMI of 25 to 29.9 is considered overweight.   A BMI of 30 and above is  considered obese.   Maintain normal blood lipids and cholesterol levels by exercising and minimizing your intake of saturated fat. Eat a balanced diet with plenty of fruit and vegetables. Blood tests for lipids and cholesterol should begin at age 20 and be repeated every 5 years. If your lipid or cholesterol levels are high, you are over 50, or you are at high risk for heart disease, you may need your cholesterol levels checked more frequently.Ongoing high lipid and cholesterol levels should be treated with medicines if diet and exercise are not effective.   If you smoke, find out from your caregiver how to quit. If you do not use tobacco, do not start.   If you are pregnant, do not drink alcohol. If you are breastfeeding, be very cautious about drinking alcohol. If you are not pregnant and choose to drink alcohol, do not exceed 1 drink per day. One drink is considered to be 12 ounces (355 mL) of beer, 5 ounces (148 mL) of wine, or 1.5 ounces (44 mL) of liquor.   Avoid use of street drugs. Do not share needles with anyone. Ask for help if you need support or instructions about stopping the use of drugs.   High blood pressure causes heart disease and increases the risk of stroke. Your blood pressure should be checked at least every 1 to 2 years. Ongoing high blood pressure should be treated with medicines if weight loss and exercise are not effective.   If you are 55 to 64   years old, ask your caregiver if you should take aspirin to prevent strokes.   Diabetes screening involves taking a blood sample to check your fasting blood sugar level. This should be done once every 3 years, after age 45, if you are within normal weight and without risk factors for diabetes. Testing should be considered at a younger age or be carried out more frequently if you are overweight and have at least 1 risk factor for diabetes.   Breast cancer screening is essential preventive care for women. You should practice "breast  self-awareness." This means understanding the normal appearance and feel of your breasts and may include breast self-examination. Any changes detected, no matter how small, should be reported to a caregiver. Women in their 20s and 30s should have a clinical breast exam (CBE) by a caregiver as part of a regular health exam every 1 to 3 years. After age 40, women should have a CBE every year. Starting at age 40, women should consider having a mammography (breast X-ray test) every year. Women who have a family history of breast cancer should talk to their caregiver about genetic screening. Women at a high risk of breast cancer should talk to their caregivers about having magnetic resonance imaging (MRI) and a mammography every year.   The Pap test is a screening test for cervical cancer. A Pap test can show cell changes on the cervix that might become cervical cancer if left untreated. A Pap test is a procedure in which cells are obtained and examined from the lower end of the uterus (cervix).   Women should have a Pap test starting at age 21.   Between ages 21 and 29, Pap tests should be repeated every 2 years.   Beginning at age 30, you should have a Pap test every 3 years as long as the past 3 Pap tests have been normal.   Some women have medical problems that increase the chance of getting cervical cancer. Talk to your caregiver about these problems. It is especially important to talk to your caregiver if a new problem develops soon after your last Pap test. In these cases, your caregiver may recommend more frequent screening and Pap tests.   The above recommendations are the same for women who have or have not gotten the vaccine for human papillomavirus (HPV).   If you had a hysterectomy for a problem that was not cancer or a condition that could lead to cancer, then you no longer need Pap tests. Even if you no longer need a Pap test, a regular exam is a good idea to make sure no other problems are  starting.   If you are between ages 65 and 70, and you have had normal Pap tests going back 10 years, you no longer need Pap tests. Even if you no longer need a Pap test, a regular exam is a good idea to make sure no other problems are starting.   If you have had past treatment for cervical cancer or a condition that could lead to cancer, you need Pap tests and screening for cancer for at least 20 years after your treatment.   If Pap tests have been discontinued, risk factors (such as a new sexual partner) need to be reassessed to determine if screening should be resumed.   The HPV test is an additional test that may be used for cervical cancer screening. The HPV test looks for the virus that can cause the cell changes on the cervix.   The cells collected during the Pap test can be tested for HPV. The HPV test could be used to screen women aged 30 years and older, and should be used in women of any age who have unclear Pap test results. After the age of 30, women should have HPV testing at the same frequency as a Pap test.   Colorectal cancer can be detected and often prevented. Most routine colorectal cancer screening begins at the age of 50 and continues through age 75. However, your caregiver may recommend screening at an earlier age if you have risk factors for colon cancer. On a yearly basis, your caregiver may provide home test kits to check for hidden blood in the stool. Use of a small camera at the end of a tube, to directly examine the colon (sigmoidoscopy or colonoscopy), can detect the earliest forms of colorectal cancer. Talk to your caregiver about this at age 50, when routine screening begins. Direct examination of the colon should be repeated every 5 to 10 years through age 75, unless early forms of pre-cancerous polyps or small growths are found.   Hepatitis C blood testing is recommended for all people born from 1945 through 1965 and any individual with known risks for hepatitis C.    Practice safe sex. Use condoms and avoid high-risk sexual practices to reduce the spread of sexually transmitted infections (STIs). STIs include gonorrhea, chlamydia, syphilis, trichomonas, herpes, HPV, and human immunodeficiency virus (HIV). Herpes, HIV, and HPV are viral illnesses that have no cure. They can result in disability, cancer, and death. Sexually active women aged 25 and younger should be checked for chlamydia. Older women with new or multiple partners should also be tested for chlamydia. Testing for other STIs is recommended if you are sexually active and at increased risk.   Osteoporosis is a disease in which the bones lose minerals and strength with aging. This can result in serious bone fractures. The risk of osteoporosis can be identified using a bone density scan. Women ages 65 and over and women at risk for fractures or osteoporosis should discuss screening with their caregivers. Ask your caregiver whether you should take a calcium supplement or vitamin D to reduce the rate of osteoporosis.   Menopause can be associated with physical symptoms and risks. Hormone replacement therapy is available to decrease symptoms and risks. You should talk to your caregiver about whether hormone replacement therapy is right for you.   Use sunscreen with sun protection factor (SPF) of 30 or more. Apply sunscreen liberally and repeatedly throughout the day. You should seek shade when your shadow is shorter than you. Protect yourself by wearing long sleeves, pants, a wide-brimmed hat, and sunglasses year round, whenever you are outdoors.   Once a month, do a whole body skin exam, using a mirror to look at the skin on your back. Notify your caregiver of new moles, moles that have irregular borders, moles that are larger than a pencil eraser, or moles that have changed in shape or color.   Stay current with required immunizations.   Influenza. You need a dose every fall (or winter). The composition of  the flu vaccine changes each year, so being vaccinated once is not enough.   Pneumococcal polysaccharide. You need 1 to 2 doses if you smoke cigarettes or if you have certain chronic medical conditions. You need 1 dose at age 65 (or older) if you have never been vaccinated.   Tetanus, diphtheria, pertussis (Tdap, Td). Get 1 dose of   Tdap vaccine if you are younger than age 65, are over 65 and have contact with an infant, are a healthcare worker, are pregnant, or simply want to be protected from whooping cough. After that, you need a Td booster dose every 10 years. Consult your caregiver if you have not had at least 3 tetanus and diphtheria-containing shots sometime in your life or have a deep or dirty wound.   HPV. You need this vaccine if you are a woman age 26 or younger. The vaccine is given in 3 doses over 6 months.   Measles, mumps, rubella (MMR). You need at least 1 dose of MMR if you were born in 1957 or later. You may also need a second dose.   Meningococcal. If you are age 19 to 21 and a first-year college student living in a residence hall, or have one of several medical conditions, you need to get vaccinated against meningococcal disease. You may also need additional booster doses.   Zoster (shingles). If you are age 60 or older, you should get this vaccine.   Varicella (chickenpox). If you have never had chickenpox or you were vaccinated but received only 1 dose, talk to your caregiver to find out if you need this vaccine.   Hepatitis A. You need this vaccine if you have a specific risk factor for hepatitis A virus infection or you simply wish to be protected from this disease. The vaccine is usually given as 2 doses, 6 to 18 months apart.   Hepatitis B. You need this vaccine if you have a specific risk factor for hepatitis B virus infection or you simply wish to be protected from this disease. The vaccine is given in 3 doses, usually over 6 months.  Preventive Services /  Frequency Ages 19 to 39  Blood pressure check.** / Every 1 to 2 years.   Lipid and cholesterol check.** / Every 5 years beginning at age 20.   Clinical breast exam.** / Every 3 years for women in their 20s and 30s.   Pap test.** / Every 2 years from ages 21 through 29. Every 3 years starting at age 30 through age 65 or 70 with a history of 3 consecutive normal Pap tests.   HPV screening.** / Every 3 years from ages 30 through ages 65 to 70 with a history of 3 consecutive normal Pap tests.   Hepatitis C blood test.** / For any individual with known risks for hepatitis C.   Skin self-exam. / Monthly.   Influenza immunization.** / Every year.   Pneumococcal polysaccharide immunization.** / 1 to 2 doses if you smoke cigarettes or if you have certain chronic medical conditions.   Tetanus, diphtheria, pertussis (Tdap, Td) immunization. / A one-time dose of Tdap vaccine. After that, you need a Td booster dose every 10 years.   HPV immunization. / 3 doses over 6 months, if you are 26 and younger.   Measles, mumps, rubella (MMR) immunization. / You need at least 1 dose of MMR if you were born in 1957 or later. You may also need a second dose.   Meningococcal immunization. / 1 dose if you are age 19 to 21 and a first-year college student living in a residence hall, or have one of several medical conditions, you need to get vaccinated against meningococcal disease. You may also need additional booster doses.   Varicella immunization.** / Consult your caregiver.   Hepatitis A immunization.** / Consult your caregiver. 2 doses, 6 to 18 months   apart.   Hepatitis B immunization.** / Consult your caregiver. 3 doses usually over 6 months.  Ages 40 to 64  Blood pressure check.** / Every 1 to 2 years.   Lipid and cholesterol check.** / Every 5 years beginning at age 20.   Clinical breast exam.** / Every year after age 40.   Mammogram.** / Every year beginning at age 40 and continuing for as  long as you are in good health. Consult with your caregiver.   Pap test.** / Every 3 years starting at age 30 through age 65 or 70 with a history of 3 consecutive normal Pap tests.   HPV screening.** / Every 3 years from ages 30 through ages 65 to 70 with a history of 3 consecutive normal Pap tests.   Fecal occult blood test (FOBT) of stool. / Every year beginning at age 50 and continuing until age 75. You may not need to do this test if you get a colonoscopy every 10 years.   Flexible sigmoidoscopy or colonoscopy.** / Every 5 years for a flexible sigmoidoscopy or every 10 years for a colonoscopy beginning at age 50 and continuing until age 75.   Hepatitis C blood test.** / For all people born from 1945 through 1965 and any individual with known risks for hepatitis C.   Skin self-exam. / Monthly.   Influenza immunization.** / Every year.   Pneumococcal polysaccharide immunization.** / 1 to 2 doses if you smoke cigarettes or if you have certain chronic medical conditions.   Tetanus, diphtheria, pertussis (Tdap, Td) immunization.** / A one-time dose of Tdap vaccine. After that, you need a Td booster dose every 10 years.   Measles, mumps, rubella (MMR) immunization. / You need at least 1 dose of MMR if you were born in 1957 or later. You may also need a second dose.   Varicella immunization.** / Consult your caregiver.   Meningococcal immunization.** / Consult your caregiver.   Hepatitis A immunization.** / Consult your caregiver. 2 doses, 6 to 18 months apart.   Hepatitis B immunization.** / Consult your caregiver. 3 doses, usually over 6 months.  Ages 65 and over  Blood pressure check.** / Every 1 to 2 years.   Lipid and cholesterol check.** / Every 5 years beginning at age 20.   Clinical breast exam.** / Every year after age 40.   Mammogram.** / Every year beginning at age 40 and continuing for as long as you are in good health. Consult with your caregiver.   Pap test.** /  Every 3 years starting at age 30 through age 65 or 70 with a 3 consecutive normal Pap tests. Testing can be stopped between 65 and 70 with 3 consecutive normal Pap tests and no abnormal Pap or HPV tests in the past 10 years.   HPV screening.** / Every 3 years from ages 30 through ages 65 or 70 with a history of 3 consecutive normal Pap tests. Testing can be stopped between 65 and 70 with 3 consecutive normal Pap tests and no abnormal Pap or HPV tests in the past 10 years.   Fecal occult blood test (FOBT) of stool. / Every year beginning at age 50 and continuing until age 75. You may not need to do this test if you get a colonoscopy every 10 years.   Flexible sigmoidoscopy or colonoscopy.** / Every 5 years for a flexible sigmoidoscopy or every 10 years for a colonoscopy beginning at age 50 and continuing until age 75.   Hepatitis   C blood test.** / For all people born from 1945 through 1965 and any individual with known risks for hepatitis C.   Osteoporosis screening.** / A one-time screening for women ages 65 and over and women at risk for fractures or osteoporosis.   Skin self-exam. / Monthly.   Influenza immunization.** / Every year.   Pneumococcal polysaccharide immunization.** / 1 dose at age 65 (or older) if you have never been vaccinated.   Tetanus, diphtheria, pertussis (Tdap, Td) immunization. / A one-time dose of Tdap vaccine if you are over 65 and have contact with an infant, are a healthcare worker, or simply want to be protected from whooping cough. After that, you need a Td booster dose every 10 years.   Varicella immunization.** / Consult your caregiver.   Meningococcal immunization.** / Consult your caregiver.   Hepatitis A immunization.** / Consult your caregiver. 2 doses, 6 to 18 months apart.   Hepatitis B immunization.** / Check with your caregiver. 3 doses, usually over 6 months.  ** Family history and personal history of risk and conditions may change your caregiver's  recommendations. Document Released: 09/02/2001 Document Revised: 06/26/2011 Document Reviewed: 12/02/2010 [employer] Patient Information 2012 [employer]. 

## 2011-11-04 ENCOUNTER — Encounter: Payer: Self-pay | Admitting: Internal Medicine

## 2011-11-04 DIAGNOSIS — Z0000 Encounter for general adult medical examination without abnormal findings: Secondary | ICD-10-CM | POA: Insufficient documentation

## 2011-11-04 NOTE — Assessment & Plan Note (Signed)
I will recheck her labs today 

## 2011-11-04 NOTE — Assessment & Plan Note (Addendum)

## 2011-11-04 NOTE — Assessment & Plan Note (Signed)
She does not have any new or worsening s/s today

## 2011-11-05 ENCOUNTER — Encounter: Payer: Self-pay | Admitting: Gastroenterology

## 2011-11-07 ENCOUNTER — Telehealth: Payer: Self-pay

## 2011-11-07 NOTE — Telephone Encounter (Signed)
Patient called [facility] stating that she received lab letter and would like a call back. Called patient [facility] to call back if needed

## 2011-12-08 ENCOUNTER — Telehealth: Payer: Self-pay | Admitting: *Deleted

## 2011-12-08 DIAGNOSIS — N951 Menopausal and female climacteric states: Secondary | ICD-10-CM

## 2011-12-08 NOTE — Telephone Encounter (Signed)
Please given 3 refills, needs FU appt in 3 months with me. Thanks!

## 2011-12-08 NOTE — Telephone Encounter (Signed)
Patient left message stating that at her last visit with Rosalita Chessman she was given an rx of estradiol to try for a few months to see how it worked for her. She is liking this medicine and would like to get a long term rx sent to her pharmacy. Will forward to [facility] for prescription.

## 2011-12-11 NOTE — Telephone Encounter (Signed)
Called pt and left message that we are returning her call to please the clinics.

## 2011-12-12 MED ORDER — ESTRADIOL 1 MG PO TABS: 1.0000 mg | ORAL_TABLET | Freq: Every day | ORAL | Status: DC

## 2011-12-12 NOTE — Telephone Encounter (Signed)
Pt returned call and asked to be re-contacted.  I left a message that a refill of the medication she requested has been sent to her pharmacy. She will need follow up appt in 4 mos w/Suzanne. Please call back next week to schedule appt

## 2012-02-04 ENCOUNTER — Other Ambulatory Visit: Payer: Self-pay | Admitting: Physician Assistant

## 2012-02-05 NOTE — Telephone Encounter (Signed)
Patient RTC. Follow-up appt. made. Rx refill request sent to [facility] until patient gets in for next f/u appt in August 12. Awaiting for [facility] approval of Rx.

## 2012-02-05 NOTE — Telephone Encounter (Signed)
Patient needs FU appt in office

## 2012-02-05 NOTE — Telephone Encounter (Signed)
Called pt and left message to return our call to the clinics its concerning an appt.  

## 2012-02-05 NOTE — Telephone Encounter (Signed)
Patient RTC; next available follow-up visit is on 03/01/12 @ 3:45pm- patient agreed with this time. Please RF rx Estrace 1mg  tablets #25 until next f/u visit. Patient will be out of Rx this Sunday.

## 2012-02-06 NOTE — Telephone Encounter (Signed)
Called pt and left message that her Rx refill is at her [facility] on [address] and that she can go pick it up if she has any questions to please give the clinics a call.

## 2012-02-25 ENCOUNTER — Ambulatory Visit: Payer: PRIVATE HEALTH INSURANCE | Admitting: Advanced Practice Midwife

## 2012-03-01 ENCOUNTER — Ambulatory Visit (INDEPENDENT_AMBULATORY_CARE_PROVIDER_SITE_OTHER): Payer: PRIVATE HEALTH INSURANCE | Admitting: Obstetrics & Gynecology

## 2012-03-01 VITALS — BP 129/85 | HR 76 | Temp 97.3°F | Ht 62.0 in | Wt 166.1 lb

## 2012-03-01 DIAGNOSIS — N951 Menopausal and female climacteric states: Secondary | ICD-10-CM

## 2012-03-01 MED ORDER — ESTRADIOL 1 MG PO TABS: 1.0000 mg | ORAL_TABLET | Freq: Every day | ORAL | Status: DC

## 2012-03-01 NOTE — Progress Notes (Signed)
Subjective:     Patient ID: Dawn [name], female   DOB: 22-Apr-1946, 66 y.o.   MRN: [id number]  HPI 24 you with h/o insomnia and severe vasomotor sx stated on HRT in March.  Pt reports that her sx are greatly improved since restarting the meds.  She was on it previously then off of it for 2 years.  No bleeding or adverse sx noted.  Her MS sx have improved since being on HRT due to improved sleep.   Review of Systems    n/c Objective:   Physical ExamBP 129/85  Pulse 76  Temp 97.3 F (36.3 C) (Oral)  Ht 5\' 2"  (1.575 m)  Wt 166 lb 1.6 oz (75.342 kg)  BMI 30.38 kg/m2  Pt in NAD deferred    Assessment:     Menopausal state.  ON HRT.  Menopausal sx resolved.  Pt wants to continue to meds. Reviewed with pt the risks of HRT and the need for reeval in 7-8 months to decide whether to continue or not.  Pt expressed understanding.     Reviewed mammogram and chart Plan:     Estridiol 1mg  po q day F/u for Annual in 7 months F/u sooner prn     Rj Pedrosa L. Harraway-Smith, M.D., [facility]

## 2012-03-01 NOTE — Patient Instructions (Signed)
Hormone Therapy At menopause, your body begins making less estrogen and progesterone hormones. This causes the body to stop having menstrual periods. This is because estrogen and progesterone hormones control your periods and menstrual cycle. A lack of estrogen may cause symptoms such as:  Hot flushes (or hot flashes).   Vaginal dryness.   Dry skin.   Loss of sex drive.   Risk of bone loss (osteoporosis).  When this happens, you may choose to take hormone therapy to get back the estrogen lost during menopause. When the hormone estrogen is given alone, it is usually referred to as ET (Estrogen Therapy). When the hormone progestin is combined with estrogen, it is generally called HT (Hormone Therapy). This was formerly known as hormone replacement therapy (HRT). Your caregiver can help you make a decision on what will be best for you. The decision to use HT seems to change often as new studies are done. Many studies do not agree on the benefits of hormone replacement therapy. LIKELY BENEFITS OF HT INCLUDE PROTECTION FROM:  Hot Flushes (also called hot flashes) - A hot flush is a sudden feeling of heat that spreads over the face and body. The skin may redden like a blush. It is connected with sweats and sleep disturbance. Women going through menopause may have hot flushes a few times a month or several times per day depending on the woman.   Osteoporosis (bone loss)- Estrogen helps guard against bone loss. After menopause, a woman's bones slowly lose calcium and become weak and brittle. As a result, bones are more likely to break. The hip, wrist, and spine are affected most often. Hormone therapy can help slow bone loss after menopause. Weight bearing exercise and taking calcium with vitamin D also can help prevent bone loss. There are also medications that your caregiver can prescribe that can help prevent osteoporosis.   Vaginal Dryness - Loss of estrogen causes changes in the vagina. Its lining  may become thin and dry. These changes can cause pain and bleeding during sexual intercourse. Dryness can also lead to infections. This can cause burning and itching. (Vaginal estrogen treatment can help relieve pain, itching, and dryness.)   Urinary Tract Infections are more common after menopause because of lack of estrogen. Some women also develop urinary incontinence because of low estrogen levels in the vagina and bladder.   Possible other benefits of estrogen include a positive effect on mood and short-term memory in women.  RISKS AND COMPLICATIONS  Using estrogen alone without progesterone causes the lining of the uterus to grow. This increases the risk of lining of the uterus (endometrial) cancer. Your caregiver should give another hormone called progestin if you have a uterus.   Women who take combined (estrogen and progestin) HT appear to have an increased risk of breast cancer. The risk appears to be small, but increases throughout the time that HT is taken.   Combined therapy also makes the breast tissue slightly denser which makes it harder to read mammograms (breast X-rays).   Combined, estrogen and progesterone therapy can be taken together every day, in which case there may be spotting of blood. HT therapy can be taken cyclically in which case you will have menstrual periods. Cyclically means HT is taken for a set amount of days, then not taken, then this process is repeated.   HT may increase the risk of stroke, heart attack, breast cancer and forming blood clots in your leg.   Transdermal estrogen (estrogen that is absorbed   through the skin with a patch or a cream) may have more positive results with:   Cholesterol.   Blood pressure.   Blood clots.  Having the following conditions may indicate you should not have HT:  Endometrial cancer.   Liver disease.   Breast cancer.   Heart disease.   History of blood clots.   Stroke.  TREATMENT   If you choose to take HT  and have a uterus, usually estrogen and progestin are prescribed.   Your caregiver will help you decide the best way to take the medications.   Possible ways to take estrogen include:   Pills.   Patches.   Gels.   Sprays.   Vaginal estrogen cream, rings and tablets.   It is best to take the lowest dose possible that will help your symptoms and take them for the shortest period of time that you can.   Hormone therapy can help relieve some of the problems (symptoms) that affect women at menopause. Before making a decision about HT, talk to your caregiver about what is best for you. Be well informed and comfortable with your decisions.  HOME CARE INSTRUCTIONS   Follow your caregivers advice when taking the medications.   A Pap test is done to screen for cervical cancer.   The first Pap test should be done at age 21.   Between ages 21 and 29, Pap tests are repeated every 2 years.   Beginning at age 30, you are advised to have a Pap test every 3 years as long as your past 3 Pap tests have been normal.   Some women have medical problems that increase the chance of getting cervical cancer. Talk to your caregiver about these problems. It is especially important to talk to your caregiver if a new problem develops soon after your last Pap test. In these cases, your caregiver may recommend more frequent screening and Pap tests.   The above recommendations are the same for women who have or have not gotten the vaccine for HPV (Human Papillomavirus).   If you had a hysterectomy for a problem that was not a cancer or a condition that could lead to cancer, then you no longer need Pap tests. However, even if you no longer need a Pap test, a regular exam is a good idea to make sure no other problems are starting.    If you are between ages 65 and 70, and you have had normal Pap tests going back 10 years, you no longer need Pap tests. However, even if you no longer need a Pap test, a regular  exam is a good idea to make sure no other problems are starting.    If you have had past treatment for cervical cancer or a condition that could lead to cancer, you need Pap tests and screening for cancer for at least 20 years after your treatment.   If Pap tests have been discontinued, risk factors (such as a new sexual partner) need to be re-assessed to determine if screening should be resumed.   Some women may need screenings more often if they are at high risk for cervical cancer.   Get mammograms done as per the advice of your caregiver.  SEEK IMMEDIATE MEDICAL CARE IF:  You develop abnormal vaginal bleeding.   You have pain or swelling in your legs, shortness of breath, or chest pain.   You develop dizziness or headaches.   You have lumps or changes in your breasts or   armpits.   You have slurred speech.   You develop weakness or numbness of your arms or legs.   You have pain, burning, or bleeding when urinating.   You develop abdominal pain.  Document Released: 04/05/2003 Document Revised: 06/26/2011 Document Reviewed: 07/24/2010 [facility] Patient Information 2012 [place], [place].Menopause Menopause is the normal time of life when menstrual periods stop completely. Menopause is complete when you have missed 12 consecutive menstrual periods. It usually occurs between the ages of 25 to 53, with an average age of 44. Very rarely does a woman develop menopause before 64 years old. At menopause, your ovaries stop producing the female hormones, estrogen and progesterone. This can cause undesirable symptoms and also affect your health. Sometimes the symptoms may occur 4 to 5 years before the menopause begins. There is no relationship between menopause and:  Oral contraceptives.   Number of children you had.   Race.   The age your menstrual periods started (menarche).  Heavy smokers and very thin women may develop menopause earlier in life. CAUSES  The ovaries stop producing  the female hormones estrogen and progesterone.   Other causes include:   Surgery to remove both ovaries.   The ovaries stop functioning for no known reason.   Tumors of the pituitary gland in the brain.   Medical disease that affects the ovaries and hormone production.   Radiation treatment to the abdomen or pelvis.   Chemotherapy that affects the ovaries.  SYMPTOMS   Hot flashes.   Night sweats.   Decrease in sex drive.   Vaginal dryness and thinning of the vagina causing painful intercourse.   Dryness of the skin and developing wrinkles.   Headaches.   Tiredness.   Irritability.   Memory problems.   Weight gain.   Bladder infections.   Hair growth of the face and chest.   Infertility.  More serious symptoms include:  Loss of bone (osteoporosis) causing breaks (fractures).   Depression.   Hardening and narrowing of the arteries (atherosclerosis) causing heart attacks and strokes.  DIAGNOSIS   When the menstrual periods have stopped for 12 straight months.   Physical exam.   Hormone studies of the blood.  TREATMENT  There are many treatment choices and nearly as many questions about them. The decisions to treat or not to treat menopausal changes is an individual choice made with your caregiver. Your caregiver can discuss the treatments with you. Together, you can decide which treatment will work best for you. Your treatment choices may include:   Hormone therapy (estorgen and progesterone).   Non-hormonal medications.   Treating the individual symptoms with medication (for example antidepressants for depression).   Herbal medications that may help specific symptoms.   Counseling by a psychiatrist or psychologist.   Group therapy.   Lifestyle changes including:   Eating healthy.   Regular exercise.   Limiting caffeine and alcohol.   Stress management and meditation.   No treatment.  HOME CARE INSTRUCTIONS   Take the medication your  caregiver gives you as directed.   Get plenty of sleep and rest.   Exercise regularly.   Eat a diet that contains calcium (good for the bones) and soy products (acts like estrogen hormone).   Avoid alcoholic beverages.   Do not smoke.   If you have hot flashes, dress in layers.   Take supplements, calcium and vitamin D to strengthen bones.   You can use over-the-counter lubricants or moisturizers for vaginal dryness.   Group therapy  is sometimes very helpful.   Acupuncture may be helpful in some cases.  SEEK MEDICAL CARE IF:   You are not sure you are in menopause.   You are having menopausal symptoms and need advice and treatment.   You are still having menstrual periods after age 81.   You have pain with intercourse.   Menopause is complete (no menstrual period for 12 months) and you develop vaginal bleeding.   You need a referral to a specialist (gynecologist, psychiatrist or psychologist) for treatment.  SEEK IMMEDIATE MEDICAL CARE IF:   You have severe depression.   You have excessive vaginal bleeding.   You fell and think you have a broken bone.   You have pain when you urinate.   You develop leg or chest pain.   You have a fast pounding heart beat (palpitations).   You have severe headaches.   You develop vision problems.   You feel a lump in your breast.   You have abdominal pain or severe indigestion.  Document Released: 09/27/2003 Document Revised: 06/26/2011 Document Reviewed: 05/04/2008 [facility] Patient Information 2012 [place], [place].

## 2012-10-26 ENCOUNTER — Other Ambulatory Visit: Payer: Self-pay | Admitting: *Deleted

## 2012-10-26 DIAGNOSIS — N951 Menopausal and female climacteric states: Secondary | ICD-10-CM

## 2012-10-26 NOTE — Telephone Encounter (Signed)
Message left by pharmacist requesting refill on estrace 1mg  tablet. Last filled on 09/27/12. Routed to Dr. Erin Fulling for refill authorization.

## 2012-10-28 MED ORDER — ESTRADIOL 1 MG PO TABS: 1.0000 mg | ORAL_TABLET | Freq: Every day | ORAL | Status: DC

## 2012-12-01 ENCOUNTER — Other Ambulatory Visit (HOSPITAL_COMMUNITY): Payer: Self-pay | Admitting: Family Medicine

## 2012-12-01 DIAGNOSIS — Z1231 Encounter for screening mammogram for malignant neoplasm of breast: Secondary | ICD-10-CM

## 2012-12-09 ENCOUNTER — Ambulatory Visit (HOSPITAL_COMMUNITY)
Admission: RE | Admit: 2012-12-09 | Discharge: 2012-12-09 | Disposition: A | Discharge status: Home / Self Care | Payer: BC Managed Care – PPO | Source: Ambulatory Visit | Attending: Family Medicine | Admitting: Family Medicine

## 2012-12-09 DIAGNOSIS — Z1231 Encounter for screening mammogram for malignant neoplasm of breast: Secondary | ICD-10-CM | POA: Insufficient documentation

## 2012-12-15 ENCOUNTER — Encounter (HOSPITAL_COMMUNITY): Payer: Self-pay | Admitting: *Deleted

## 2012-12-15 ENCOUNTER — Emergency Department (INDEPENDENT_AMBULATORY_CARE_PROVIDER_SITE_OTHER)
Admission: EM | Admit: 2012-12-15 | Discharge: 2012-12-15 | Disposition: A | Discharge status: Home / Self Care | Payer: BC Managed Care – PPO | Source: Home / Self Care

## 2012-12-15 DIAGNOSIS — G35 Multiple sclerosis: Secondary | ICD-10-CM

## 2012-12-15 NOTE — ED Provider Notes (Signed)
History     CSN: [id number]  Arrival date & time 12/15/12  1825   None     Chief Complaint  Patient presents with  . Eye Problem    (Consider location/radiation/quality/duration/timing/severity/associated sxs/prior treatment) HPI 67 yo F with MS presenting with vision problem.  She reports that this evening she developed a vision problem she describes as her eyes twitching and moving back and forth causing her to see prisms and jumping vision.  Denies blurry vision or double vision.  Associate with feeling somewhat dissociated and legs feeling spongy.  Denies vertigo.  The vision problems improved with just one eye open at a time.  This lasted about 30-45 minutes, but is not much improved.  Right eye is still twitchy and both eyes are somewhat uncomfortable but she denies eye pain.  Discomfort is worse with eye movement.  Her neurologist is Dr. Epimenio Foot at [facility].   Past Medical History  Diagnosis Date  . Allergy   . Multiple sclerosis   . Hot flashes, menopausal 10/13/2011    Estradiol started     Past Surgical History  Procedure Laterality Date  . Lumbar fusion    . Abdominal hysterectomy  2002    partial    Family History  Problem Relation Age of Onset  . Arthritis    . Hypertension    . Heart failure Mother   . Heart failure Father     History  Substance Use Topics  . Smoking status: Never Smoker   . Smokeless tobacco: Not on file  . Alcohol Use: Yes     Comment: rarely    OB History   Grav Para Term Preterm Abortions TAB SAB Ect Mult Living   2    2  2    0      Review of Systems  HENT: Negative.   Eyes: Positive for photophobia and visual disturbance. Negative for pain and itching.  Respiratory: Negative.   Cardiovascular: Negative.   Gastrointestinal: Negative.   Genitourinary: Negative.   Musculoskeletal: Negative.   Skin: Negative.   Neurological: Positive for weakness. Negative for dizziness and light-headedness.    Allergies   Review of patient's allergies indicates no known allergies.  Home Medications   Current Outpatient Rx  Name  Route  Sig  Dispense  Refill  . acetaminophen (TYLENOL) 500 MG tablet   Oral   Take 500 mg by mouth every 6 (six) hours as needed.         [name] Kitchen aspirin 81 MG tablet   Oral   Take 81 mg by mouth daily.         . calcium carbonate (OS-CAL) 600 MG TABS   Oral   Take 600 mg by mouth daily.         . cholecalciferol (VITAMIN D) 1000 UNITS tablet   Oral   Take 1,000 Units by mouth daily.          . clonazePAM (KLONOPIN) 0.5 MG tablet   Oral   Take 0.5 mg by mouth at bedtime.         . docusate sodium (COLACE) 100 MG capsule   Oral   Take 200 mg by mouth at bedtime.         [name] Kitchen estradiol (ESTRACE) 1 MG tablet   Oral   Take 1 tablet (1 mg total) by mouth daily.   30 tablet   7   . fexofenadine (ALLEGRA) 180 MG tablet   Oral   Take 180  mg by mouth daily.         . interferon beta-1b (BETASERON) 0.3 MG injection   Subcutaneous   Inject 0.3 mg into the skin every other day.         . modafinil (PROVIGIL) 200 MG tablet   Oral   Take 200 mg by mouth daily as needed.         . Multiple Vitamins-Minerals (MULTIVITAMIN WITH MINERALS) tablet   Oral   Take 1 tablet by mouth daily.         [name] Kitchen EXPIRED: estradiol (ESTRACE) 1 MG tablet   Oral   Take 1 tablet (1 mg total) by mouth daily.   30 tablet   3   . etodolac (LODINE) 400 MG tablet   Oral   Take 400 mg by mouth as needed.         . fluticasone (VERAMYST) 27.5 MCG/SPRAY nasal spray   Nasal   Place 2 sprays into the nose daily as needed.         . predniSONE (STERAPRED UNI-PAK) 10 MG tablet   Oral   Take 10 mg by mouth daily as needed.         . traMADol (ULTRAM) 50 MG tablet   Oral   Take 50 mg by mouth every 6 (six) hours as needed.           BP 134/79  Pulse 72  Temp(Src) 98.7 F (37.1 C) (Oral)  Resp 20  SpO2 96%  Physical Exam  Constitutional: She is oriented to  person, place, and time. She appears well-developed and well-nourished. No distress.  HENT:  Head: Normocephalic and atraumatic.  Mouth/Throat: Oropharynx is clear and moist. No oropharyngeal exudate.  Eyes: Conjunctivae and EOM are normal. Pupils are equal, round, and reactive to light. Right eye exhibits no discharge. Left eye exhibits no discharge. No scleral icterus.  Neck: Normal range of motion.  Cardiovascular: Normal rate, regular rhythm and normal heart sounds.   No murmur heard. Pulmonary/Chest: Effort normal and breath sounds normal. No respiratory distress. She has no wheezes. She has no rales.  Lymphadenopathy:    She has no cervical adenopathy.  Neurological: She is alert and oriented to person, place, and time. No cranial nerve deficit. Coordination normal.  Skin: Skin is warm and dry. No rash noted. She is not diaphoretic. No erythema.  Psychiatric: She has a normal mood and affect. Thought content normal.    ED Course  Procedures (including critical care time)  Labs Reviewed - No data to display No results found.   1. Multiple sclerosis exacerbation       MDM  67 yo F with MS presenting with visual disturbance that is likely an MS exacerbation.  Her symptoms are much improved at time of exam, no neurologic findings on cranial nerve exam.  No nystagmus.  She is stable for discharge with f/u with her neurologist.  She will call his office in the morning.  Reviewed reasons to go to the ED as in AVS.         Phebe Colla, MD 12/15/12 2042

## 2012-12-15 NOTE — ED Notes (Signed)
Hx MS She noticed she had a headache @ 1600.  She took 2 baby aspirin @ 1645.  Had visual disturbance at the same time.  She saw prisms, what she was looking at was crossing in front of her.  She could not get up because the room was spinning.  Her legs felt really spongy and she feels jumpy.  R eye feels twitchy and still has a headache.

## 2012-12-18 NOTE — ED Provider Notes (Signed)
Medical screening examination/treatment/procedure(s) were performed by resident physician or non-physician practitioner and as supervising physician I was immediately available for consultation/collaboration.   Barkley Bruns MD.   Linna Hoff, MD 12/18/12 [phone]

## 2012-12-20 DIAGNOSIS — J329 Chronic sinusitis, unspecified: Secondary | ICD-10-CM | POA: Diagnosis not present

## 2012-12-20 DIAGNOSIS — R946 Abnormal results of thyroid function studies: Secondary | ICD-10-CM | POA: Diagnosis not present

## 2012-12-30 DIAGNOSIS — R209 Unspecified disturbances of skin sensation: Secondary | ICD-10-CM | POA: Diagnosis not present

## 2012-12-30 DIAGNOSIS — IMO0002 Reserved for concepts with insufficient information to code with codable children: Secondary | ICD-10-CM | POA: Diagnosis not present

## 2012-12-30 DIAGNOSIS — G35 Multiple sclerosis: Secondary | ICD-10-CM | POA: Diagnosis not present

## 2012-12-30 DIAGNOSIS — G96198 Other disorders of meninges, not elsewhere classified: Secondary | ICD-10-CM | POA: Diagnosis not present

## 2012-12-30 DIAGNOSIS — R269 Unspecified abnormalities of gait and mobility: Secondary | ICD-10-CM | POA: Diagnosis not present

## 2013-01-06 DIAGNOSIS — IMO0002 Reserved for concepts with insufficient information to code with codable children: Secondary | ICD-10-CM | POA: Diagnosis not present

## 2013-01-06 DIAGNOSIS — G96198 Other disorders of meninges, not elsewhere classified: Secondary | ICD-10-CM | POA: Diagnosis not present

## 2013-01-06 DIAGNOSIS — M5126 Other intervertebral disc displacement, lumbar region: Secondary | ICD-10-CM | POA: Diagnosis not present

## 2013-01-28 DIAGNOSIS — IMO0002 Reserved for concepts with insufficient information to code with codable children: Secondary | ICD-10-CM | POA: Diagnosis not present

## 2013-01-31 DIAGNOSIS — H539 Unspecified visual disturbance: Secondary | ICD-10-CM | POA: Insufficient documentation

## 2013-01-31 DIAGNOSIS — G35 Multiple sclerosis: Secondary | ICD-10-CM | POA: Diagnosis not present

## 2013-01-31 DIAGNOSIS — H5340 Unspecified visual field defects: Secondary | ICD-10-CM | POA: Diagnosis not present

## 2013-02-16 DIAGNOSIS — R42 Dizziness and giddiness: Secondary | ICD-10-CM | POA: Diagnosis not present

## 2013-02-16 DIAGNOSIS — G96198 Other disorders of meninges, not elsewhere classified: Secondary | ICD-10-CM | POA: Diagnosis not present

## 2013-02-16 DIAGNOSIS — H532 Diplopia: Secondary | ICD-10-CM | POA: Diagnosis not present

## 2013-02-16 DIAGNOSIS — R269 Unspecified abnormalities of gait and mobility: Secondary | ICD-10-CM | POA: Diagnosis not present

## 2013-02-16 DIAGNOSIS — R279 Unspecified lack of coordination: Secondary | ICD-10-CM | POA: Diagnosis not present

## 2013-02-16 DIAGNOSIS — G35 Multiple sclerosis: Secondary | ICD-10-CM | POA: Diagnosis not present

## 2013-03-03 DIAGNOSIS — G96198 Other disorders of meninges, not elsewhere classified: Secondary | ICD-10-CM | POA: Diagnosis not present

## 2013-03-03 DIAGNOSIS — H539 Unspecified visual disturbance: Secondary | ICD-10-CM | POA: Diagnosis not present

## 2013-03-03 DIAGNOSIS — R269 Unspecified abnormalities of gait and mobility: Secondary | ICD-10-CM | POA: Diagnosis not present

## 2013-03-03 DIAGNOSIS — G35 Multiple sclerosis: Secondary | ICD-10-CM | POA: Diagnosis not present

## 2013-03-14 DIAGNOSIS — G35 Multiple sclerosis: Secondary | ICD-10-CM | POA: Diagnosis not present

## 2013-03-14 DIAGNOSIS — H539 Unspecified visual disturbance: Secondary | ICD-10-CM | POA: Diagnosis not present

## 2013-03-14 DIAGNOSIS — H532 Diplopia: Secondary | ICD-10-CM | POA: Diagnosis not present

## 2013-04-20 DIAGNOSIS — R110 Nausea: Secondary | ICD-10-CM | POA: Diagnosis not present

## 2013-04-20 DIAGNOSIS — N200 Calculus of kidney: Secondary | ICD-10-CM | POA: Diagnosis not present

## 2013-04-20 DIAGNOSIS — Z23 Encounter for immunization: Secondary | ICD-10-CM | POA: Diagnosis not present

## 2013-04-20 DIAGNOSIS — K219 Gastro-esophageal reflux disease without esophagitis: Secondary | ICD-10-CM | POA: Diagnosis not present

## 2013-04-20 DIAGNOSIS — R109 Unspecified abdominal pain: Secondary | ICD-10-CM | POA: Diagnosis not present

## 2013-04-20 DIAGNOSIS — R319 Hematuria, unspecified: Secondary | ICD-10-CM | POA: Diagnosis not present

## 2013-04-20 DIAGNOSIS — R310 Gross hematuria: Secondary | ICD-10-CM | POA: Diagnosis not present

## 2013-06-02 DIAGNOSIS — IMO0002 Reserved for concepts with insufficient information to code with codable children: Secondary | ICD-10-CM | POA: Diagnosis not present

## 2013-06-02 DIAGNOSIS — G35 Multiple sclerosis: Secondary | ICD-10-CM | POA: Diagnosis not present

## 2013-06-02 DIAGNOSIS — G96198 Other disorders of meninges, not elsewhere classified: Secondary | ICD-10-CM | POA: Diagnosis not present

## 2013-06-02 DIAGNOSIS — R3915 Urgency of urination: Secondary | ICD-10-CM | POA: Diagnosis not present

## 2013-06-02 DIAGNOSIS — R5381 Other malaise: Secondary | ICD-10-CM | POA: Diagnosis not present

## 2013-06-02 DIAGNOSIS — R269 Unspecified abnormalities of gait and mobility: Secondary | ICD-10-CM | POA: Diagnosis not present

## 2013-06-07 DIAGNOSIS — R946 Abnormal results of thyroid function studies: Secondary | ICD-10-CM | POA: Diagnosis not present

## 2013-06-07 DIAGNOSIS — E559 Vitamin D deficiency, unspecified: Secondary | ICD-10-CM | POA: Diagnosis not present

## 2013-06-07 DIAGNOSIS — IMO0002 Reserved for concepts with insufficient information to code with codable children: Secondary | ICD-10-CM | POA: Diagnosis not present

## 2013-07-20 DIAGNOSIS — J029 Acute pharyngitis, unspecified: Secondary | ICD-10-CM | POA: Diagnosis not present

## 2013-07-20 DIAGNOSIS — J329 Chronic sinusitis, unspecified: Secondary | ICD-10-CM | POA: Diagnosis not present

## 2013-08-01 DIAGNOSIS — G35 Multiple sclerosis: Secondary | ICD-10-CM | POA: Diagnosis not present

## 2013-08-01 DIAGNOSIS — H539 Unspecified visual disturbance: Secondary | ICD-10-CM | POA: Diagnosis not present

## 2013-08-01 DIAGNOSIS — G96198 Other disorders of meninges, not elsewhere classified: Secondary | ICD-10-CM | POA: Diagnosis not present

## 2013-08-09 DIAGNOSIS — M545 Low back pain, unspecified: Secondary | ICD-10-CM | POA: Diagnosis not present

## 2013-08-09 DIAGNOSIS — M5137 Other intervertebral disc degeneration, lumbosacral region: Secondary | ICD-10-CM | POA: Diagnosis not present

## 2013-08-09 DIAGNOSIS — E669 Obesity, unspecified: Secondary | ICD-10-CM | POA: Diagnosis not present

## 2013-08-09 DIAGNOSIS — M546 Pain in thoracic spine: Secondary | ICD-10-CM | POA: Diagnosis not present

## 2013-10-20 DIAGNOSIS — G35 Multiple sclerosis: Secondary | ICD-10-CM | POA: Diagnosis not present

## 2013-10-20 DIAGNOSIS — G96198 Other disorders of meninges, not elsewhere classified: Secondary | ICD-10-CM | POA: Diagnosis not present

## 2013-10-20 DIAGNOSIS — IMO0002 Reserved for concepts with insufficient information to code with codable children: Secondary | ICD-10-CM | POA: Diagnosis not present

## 2013-10-20 DIAGNOSIS — R269 Unspecified abnormalities of gait and mobility: Secondary | ICD-10-CM | POA: Diagnosis not present

## 2013-10-31 ENCOUNTER — Ambulatory Visit: Payer: Medicare Other | Attending: Neurology | Admitting: Physical Therapy

## 2013-10-31 DIAGNOSIS — IMO0001 Reserved for inherently not codable concepts without codable children: Secondary | ICD-10-CM | POA: Diagnosis not present

## 2013-10-31 DIAGNOSIS — R262 Difficulty in walking, not elsewhere classified: Secondary | ICD-10-CM | POA: Insufficient documentation

## 2013-10-31 DIAGNOSIS — M545 Low back pain, unspecified: Secondary | ICD-10-CM | POA: Insufficient documentation

## 2013-10-31 DIAGNOSIS — G35 Multiple sclerosis: Secondary | ICD-10-CM | POA: Diagnosis not present

## 2013-10-31 DIAGNOSIS — R293 Abnormal posture: Secondary | ICD-10-CM | POA: Insufficient documentation

## 2013-10-31 DIAGNOSIS — R269 Unspecified abnormalities of gait and mobility: Secondary | ICD-10-CM | POA: Diagnosis not present

## 2013-10-31 DIAGNOSIS — M6281 Muscle weakness (generalized): Secondary | ICD-10-CM | POA: Insufficient documentation

## 2013-10-31 DIAGNOSIS — M25569 Pain in unspecified knee: Secondary | ICD-10-CM | POA: Insufficient documentation

## 2013-11-01 ENCOUNTER — Ambulatory Visit: Payer: Medicare Other | Admitting: Physical Therapy

## 2013-11-01 DIAGNOSIS — M6281 Muscle weakness (generalized): Secondary | ICD-10-CM | POA: Diagnosis not present

## 2013-11-01 DIAGNOSIS — IMO0001 Reserved for inherently not codable concepts without codable children: Secondary | ICD-10-CM | POA: Diagnosis not present

## 2013-11-01 DIAGNOSIS — R293 Abnormal posture: Secondary | ICD-10-CM | POA: Diagnosis not present

## 2013-11-01 DIAGNOSIS — R269 Unspecified abnormalities of gait and mobility: Secondary | ICD-10-CM | POA: Diagnosis not present

## 2013-11-01 DIAGNOSIS — M545 Low back pain, unspecified: Secondary | ICD-10-CM | POA: Diagnosis not present

## 2013-11-01 DIAGNOSIS — R262 Difficulty in walking, not elsewhere classified: Secondary | ICD-10-CM | POA: Diagnosis not present

## 2013-11-07 ENCOUNTER — Ambulatory Visit: Payer: Medicare Other | Admitting: Physical Therapy

## 2013-11-07 DIAGNOSIS — R262 Difficulty in walking, not elsewhere classified: Secondary | ICD-10-CM | POA: Diagnosis not present

## 2013-11-07 DIAGNOSIS — M545 Low back pain, unspecified: Secondary | ICD-10-CM | POA: Diagnosis not present

## 2013-11-07 DIAGNOSIS — IMO0001 Reserved for inherently not codable concepts without codable children: Secondary | ICD-10-CM | POA: Diagnosis not present

## 2013-11-07 DIAGNOSIS — R269 Unspecified abnormalities of gait and mobility: Secondary | ICD-10-CM | POA: Diagnosis not present

## 2013-11-07 DIAGNOSIS — M6281 Muscle weakness (generalized): Secondary | ICD-10-CM | POA: Diagnosis not present

## 2013-11-07 DIAGNOSIS — R293 Abnormal posture: Secondary | ICD-10-CM | POA: Diagnosis not present

## 2013-11-10 ENCOUNTER — Ambulatory Visit: Payer: Medicare Other | Admitting: Physical Therapy

## 2013-11-10 DIAGNOSIS — R293 Abnormal posture: Secondary | ICD-10-CM | POA: Diagnosis not present

## 2013-11-10 DIAGNOSIS — R269 Unspecified abnormalities of gait and mobility: Secondary | ICD-10-CM | POA: Diagnosis not present

## 2013-11-10 DIAGNOSIS — M545 Low back pain, unspecified: Secondary | ICD-10-CM | POA: Diagnosis not present

## 2013-11-10 DIAGNOSIS — IMO0001 Reserved for inherently not codable concepts without codable children: Secondary | ICD-10-CM | POA: Diagnosis not present

## 2013-11-10 DIAGNOSIS — M6281 Muscle weakness (generalized): Secondary | ICD-10-CM | POA: Diagnosis not present

## 2013-11-10 DIAGNOSIS — R262 Difficulty in walking, not elsewhere classified: Secondary | ICD-10-CM | POA: Diagnosis not present

## 2013-11-21 ENCOUNTER — Encounter: Payer: BC Managed Care – PPO | Admitting: Physical Therapy

## 2013-11-24 ENCOUNTER — Ambulatory Visit: Payer: Medicare Other | Attending: Neurology | Admitting: Physical Therapy

## 2013-11-24 DIAGNOSIS — R262 Difficulty in walking, not elsewhere classified: Secondary | ICD-10-CM | POA: Insufficient documentation

## 2013-11-24 DIAGNOSIS — IMO0001 Reserved for inherently not codable concepts without codable children: Secondary | ICD-10-CM | POA: Insufficient documentation

## 2013-11-24 DIAGNOSIS — M6281 Muscle weakness (generalized): Secondary | ICD-10-CM | POA: Insufficient documentation

## 2013-11-24 DIAGNOSIS — M545 Low back pain, unspecified: Secondary | ICD-10-CM | POA: Diagnosis not present

## 2013-11-24 DIAGNOSIS — R293 Abnormal posture: Secondary | ICD-10-CM | POA: Insufficient documentation

## 2013-11-24 DIAGNOSIS — M25569 Pain in unspecified knee: Secondary | ICD-10-CM | POA: Insufficient documentation

## 2013-11-24 DIAGNOSIS — R269 Unspecified abnormalities of gait and mobility: Secondary | ICD-10-CM | POA: Diagnosis not present

## 2013-11-28 ENCOUNTER — Encounter: Payer: BC Managed Care – PPO | Admitting: Physical Therapy

## 2013-12-01 ENCOUNTER — Ambulatory Visit: Payer: Medicare Other | Admitting: Physical Therapy

## 2013-12-06 ENCOUNTER — Ambulatory Visit: Payer: Medicare Other | Admitting: Physical Therapy

## 2013-12-08 ENCOUNTER — Encounter: Payer: BC Managed Care – PPO | Admitting: Physical Therapy

## 2014-01-19 ENCOUNTER — Other Ambulatory Visit (HOSPITAL_COMMUNITY): Payer: Self-pay | Admitting: Obstetrics and Gynecology

## 2014-01-19 DIAGNOSIS — Z1231 Encounter for screening mammogram for malignant neoplasm of breast: Secondary | ICD-10-CM

## 2014-01-30 ENCOUNTER — Ambulatory Visit (HOSPITAL_COMMUNITY)
Admission: RE | Admit: 2014-01-30 | Discharge: 2014-01-30 | Disposition: A | Discharge status: Home / Self Care | Payer: Medicare Other | Source: Ambulatory Visit | Attending: Obstetrics and Gynecology | Admitting: Obstetrics and Gynecology

## 2014-01-30 DIAGNOSIS — Z124 Encounter for screening for malignant neoplasm of cervix: Secondary | ICD-10-CM | POA: Diagnosis not present

## 2014-01-30 DIAGNOSIS — Z1231 Encounter for screening mammogram for malignant neoplasm of breast: Secondary | ICD-10-CM

## 2014-01-30 DIAGNOSIS — Z01419 Encounter for gynecological examination (general) (routine) without abnormal findings: Secondary | ICD-10-CM | POA: Diagnosis not present

## 2014-02-09 DIAGNOSIS — R5383 Other fatigue: Secondary | ICD-10-CM | POA: Diagnosis not present

## 2014-02-09 DIAGNOSIS — G96198 Other disorders of meninges, not elsewhere classified: Secondary | ICD-10-CM | POA: Diagnosis not present

## 2014-02-09 DIAGNOSIS — G35 Multiple sclerosis: Secondary | ICD-10-CM | POA: Diagnosis not present

## 2014-02-09 DIAGNOSIS — R5381 Other malaise: Secondary | ICD-10-CM | POA: Diagnosis not present

## 2014-02-09 DIAGNOSIS — K219 Gastro-esophageal reflux disease without esophagitis: Secondary | ICD-10-CM | POA: Diagnosis not present

## 2014-02-09 DIAGNOSIS — R209 Unspecified disturbances of skin sensation: Secondary | ICD-10-CM | POA: Diagnosis not present

## 2014-02-09 DIAGNOSIS — L089 Local infection of the skin and subcutaneous tissue, unspecified: Secondary | ICD-10-CM | POA: Diagnosis not present

## 2014-02-09 DIAGNOSIS — R269 Unspecified abnormalities of gait and mobility: Secondary | ICD-10-CM | POA: Diagnosis not present

## 2014-02-10 ENCOUNTER — Other Ambulatory Visit (HOSPITAL_COMMUNITY): Payer: Self-pay | Admitting: Obstetrics and Gynecology

## 2014-02-10 DIAGNOSIS — M899 Disorder of bone, unspecified: Secondary | ICD-10-CM

## 2014-02-10 DIAGNOSIS — N951 Menopausal and female climacteric states: Secondary | ICD-10-CM

## 2014-02-10 DIAGNOSIS — M949 Disorder of cartilage, unspecified: Secondary | ICD-10-CM

## 2014-02-17 DIAGNOSIS — K219 Gastro-esophageal reflux disease without esophagitis: Secondary | ICD-10-CM | POA: Diagnosis not present

## 2014-02-17 DIAGNOSIS — L989 Disorder of the skin and subcutaneous tissue, unspecified: Secondary | ICD-10-CM | POA: Diagnosis not present

## 2014-03-08 DIAGNOSIS — L989 Disorder of the skin and subcutaneous tissue, unspecified: Secondary | ICD-10-CM | POA: Diagnosis not present

## 2014-03-29 ENCOUNTER — Ambulatory Visit
Admission: RE | Admit: 2014-03-29 | Discharge: 2014-03-29 | Disposition: A | Discharge status: Home / Self Care | Payer: Medicare Other | Source: Ambulatory Visit | Attending: Family Medicine | Admitting: Family Medicine

## 2014-03-29 ENCOUNTER — Other Ambulatory Visit: Payer: Self-pay | Admitting: Family Medicine

## 2014-03-29 DIAGNOSIS — L989 Disorder of the skin and subcutaneous tissue, unspecified: Secondary | ICD-10-CM | POA: Diagnosis not present

## 2014-03-29 DIAGNOSIS — K219 Gastro-esophageal reflux disease without esophagitis: Secondary | ICD-10-CM | POA: Diagnosis not present

## 2014-03-29 DIAGNOSIS — R52 Pain, unspecified: Secondary | ICD-10-CM

## 2014-03-29 DIAGNOSIS — M25559 Pain in unspecified hip: Secondary | ICD-10-CM | POA: Diagnosis not present

## 2014-03-29 DIAGNOSIS — R109 Unspecified abdominal pain: Secondary | ICD-10-CM | POA: Diagnosis not present

## 2014-04-10 ENCOUNTER — Other Ambulatory Visit: Payer: Self-pay | Admitting: Family Medicine

## 2014-04-11 ENCOUNTER — Ambulatory Visit
Admission: RE | Admit: 2014-04-11 | Discharge: 2014-04-11 | Disposition: A | Discharge status: Home / Self Care | Payer: Medicare Other | Source: Ambulatory Visit | Attending: Family Medicine | Admitting: Family Medicine

## 2014-04-11 ENCOUNTER — Other Ambulatory Visit: Payer: Self-pay | Admitting: Family Medicine

## 2014-04-11 DIAGNOSIS — M5416 Radiculopathy, lumbar region: Secondary | ICD-10-CM

## 2014-04-11 DIAGNOSIS — M47817 Spondylosis without myelopathy or radiculopathy, lumbosacral region: Secondary | ICD-10-CM | POA: Diagnosis not present

## 2014-04-11 DIAGNOSIS — M5137 Other intervertebral disc degeneration, lumbosacral region: Secondary | ICD-10-CM | POA: Diagnosis not present

## 2014-04-14 ENCOUNTER — Other Ambulatory Visit: Payer: Self-pay | Admitting: Family Medicine

## 2014-04-14 DIAGNOSIS — R109 Unspecified abdominal pain: Secondary | ICD-10-CM

## 2014-04-19 ENCOUNTER — Ambulatory Visit
Admission: RE | Admit: 2014-04-19 | Discharge: 2014-04-19 | Disposition: A | Discharge status: Home / Self Care | Payer: Medicare Other | Source: Ambulatory Visit | Attending: Family Medicine | Admitting: Family Medicine

## 2014-04-19 DIAGNOSIS — I708 Atherosclerosis of other arteries: Secondary | ICD-10-CM | POA: Diagnosis not present

## 2014-04-19 DIAGNOSIS — R109 Unspecified abdominal pain: Secondary | ICD-10-CM

## 2014-04-19 DIAGNOSIS — N200 Calculus of kidney: Secondary | ICD-10-CM | POA: Diagnosis not present

## 2014-04-19 MED ORDER — IOHEXOL 300 MG/ML  SOLN
100.0000 mL | Freq: Once | INTRAMUSCULAR | Status: AC | PRN
  Administered 2014-04-19: 100 mL via INTRAVENOUS

## 2014-05-12 DIAGNOSIS — Z23 Encounter for immunization: Secondary | ICD-10-CM | POA: Diagnosis not present

## 2014-05-12 DIAGNOSIS — N200 Calculus of kidney: Secondary | ICD-10-CM | POA: Diagnosis not present

## 2014-05-12 DIAGNOSIS — I700 Atherosclerosis of aorta: Secondary | ICD-10-CM | POA: Diagnosis not present

## 2014-05-12 DIAGNOSIS — R1030 Lower abdominal pain, unspecified: Secondary | ICD-10-CM | POA: Diagnosis not present

## 2014-05-12 DIAGNOSIS — I2510 Atherosclerotic heart disease of native coronary artery without angina pectoris: Secondary | ICD-10-CM | POA: Diagnosis not present

## 2014-05-12 DIAGNOSIS — D259 Leiomyoma of uterus, unspecified: Secondary | ICD-10-CM | POA: Diagnosis not present

## 2014-05-22 ENCOUNTER — Encounter (HOSPITAL_COMMUNITY): Payer: Self-pay | Admitting: *Deleted

## 2014-06-05 DIAGNOSIS — M25551 Pain in right hip: Secondary | ICD-10-CM | POA: Diagnosis not present

## 2014-06-08 DIAGNOSIS — Z79899 Other long term (current) drug therapy: Secondary | ICD-10-CM | POA: Diagnosis not present

## 2014-06-08 DIAGNOSIS — R5383 Other fatigue: Secondary | ICD-10-CM | POA: Diagnosis not present

## 2014-06-08 DIAGNOSIS — R260 Ataxic gait: Secondary | ICD-10-CM | POA: Diagnosis not present

## 2014-06-08 DIAGNOSIS — M545 Low back pain: Secondary | ICD-10-CM | POA: Diagnosis not present

## 2014-06-08 DIAGNOSIS — F329 Major depressive disorder, single episode, unspecified: Secondary | ICD-10-CM | POA: Diagnosis not present

## 2014-06-08 DIAGNOSIS — G4700 Insomnia, unspecified: Secondary | ICD-10-CM | POA: Diagnosis not present

## 2014-06-08 DIAGNOSIS — G35 Multiple sclerosis: Secondary | ICD-10-CM | POA: Diagnosis not present

## 2014-06-13 DIAGNOSIS — M25551 Pain in right hip: Secondary | ICD-10-CM | POA: Diagnosis not present

## 2014-06-19 DIAGNOSIS — M25551 Pain in right hip: Secondary | ICD-10-CM | POA: Diagnosis not present

## 2014-08-04 ENCOUNTER — Encounter: Payer: Self-pay | Admitting: Neurology

## 2014-08-04 ENCOUNTER — Ambulatory Visit (INDEPENDENT_AMBULATORY_CARE_PROVIDER_SITE_OTHER): Payer: Medicare Other | Admitting: Neurology

## 2014-08-04 VITALS — BP 154/90 | HR 64 | Resp 14 | Wt 183.0 lb

## 2014-08-04 DIAGNOSIS — M7061 Trochanteric bursitis, right hip: Secondary | ICD-10-CM | POA: Insufficient documentation

## 2014-08-04 DIAGNOSIS — R269 Unspecified abnormalities of gait and mobility: Secondary | ICD-10-CM | POA: Insufficient documentation

## 2014-08-04 DIAGNOSIS — R350 Frequency of micturition: Secondary | ICD-10-CM | POA: Diagnosis not present

## 2014-08-04 DIAGNOSIS — F4323 Adjustment disorder with mixed anxiety and depressed mood: Secondary | ICD-10-CM

## 2014-08-04 DIAGNOSIS — R5382 Chronic fatigue, unspecified: Secondary | ICD-10-CM

## 2014-08-04 DIAGNOSIS — G35 Multiple sclerosis: Secondary | ICD-10-CM

## 2014-08-04 MED ORDER — ESCITALOPRAM OXALATE 10 MG PO TABS: 10.0000 mg | ORAL_TABLET | Freq: Every day | ORAL | Status: DC

## 2014-08-04 NOTE — Progress Notes (Signed)
[facility]  PATIENT: Dawn [name] DOB: 1946-07-02  REFERRING CLINICIAN: Aura Dials HISTORY FROM: Patient REASON FOR VISIT: MS   HISTORICAL  CHIEF COMPLAINT:  Chief Complaint  Patient presents with  . Multiple Sclerosis  . Back Pain    She c/o increased pain in lower back radiating into bilat buttocks, worse with standing. Sts. she has seen [facility] for same--had an mri and ct that showed a right labrum tear. Some relief with Meloxicam but it caused dizziness so she stopped it.  Some relief with Ibuprofen 600mg  to 800mg  daily.     HISTORY OF PRESENT ILLNESS:  Dawn [name] is a 69 year old woman who was diagnosed with multiple sclerosis in 2001. In 1994, she had an episode of severe vertigo with gait ataxia. An MRI at that time was reportedly normal. The symptoms were felt to be due to allergies. About 7 years later she had vertigo, gait ataxia and diplopia. Also her left leg was giving out. Short after that she had a hysterectomy. Postoperatively, she was numb in both legs and she had a lumbar puncture which showed changes consistent with MS. Then, an MRI of the brain was performed showing changes consistent with MS. She was referred to Dr. Erling Cruz who her on Betaseron. Last year, she switched from Betaseron to Tecfidera, in the hope that she would be able to avoid shots. However, she had a lot of difficulty tolerating Tecfidera have the severe GI symptoms including vomiting and GERD.   Because of that, she was switched to Plegridy. She has had a lot of flulike symptoms on Plegridy and skin reactions.   Skin lesions got infected and it has cleared up now. We had talked about different options for her in the past. Because of possible safety issue she is reluctant to go on Gilenya or Tysabri. She is concerned about the side effects of Aubagio. For the time being, she wishes to stay on Plegridy. However, if the skin reactions do not improve she will want to  either switched to Avonex or Betaseron. She showed me a couple of her skin reactions. Generally, she has a mild reaction for day or 2 that then becomes more severe for week or 2 before resolving. The injection from 3 weeks ago is more apparent than the one from a couple days ago.  Currently, her main issues are leg dysesthesias, lower back pain, myalgias, gait disturbance, fatigue, bladder issues and mood disturbance.  He has had dysesthesias in the leg and feet for many years. She is on gabapentin for this with only modest benefit.  She has lower back and leg pain. She has known degenerative disc changes and spondylosis in the lower back. She believes she has received some benefit from physical therapy and she tries to do the exercises regularly. As the day goes on, she starts to get myalgias in the hips and shoulders and elsewhere. As this pain builds up it bothers her more than the other pain. There is a more constant pain in the right hip that is located more laterally (she points to the trochanteric bursa).   This pain increases with standing.  Notes gait disturbance with left greater than right leg weakness and clumsiness. She sometimes drags the left leg. Also, as the day goes on she has more pain and that makes it harder for her to walk longer distances.   She has fallen a few times but not lately.  She notes fatigue that builds up as a  day goes on. This is both physical and cognitive. She gets apathetic, especially when she is more tired. Cognitively, she has difficulty with short-term memory as well as with verbal fluency. Decision making is more difficult for her to do when she is tired. She takes modafinil sometimes with some benefit. However, the benefit does not always last. She feels more anxious and depressed since she has had difficulties with the Plegridy.  He reports difficulty with her bladder. She has urinary urgency and frequency. She also does not always completely empty and has to  go back a second time quickly. Vesicare has helped some with the frequency.  REVIEW OF SYSTEMS:  Constitutional: No fevers, chills, sweats, or change in appetite Eyes: No visual changes, double vision, eye pain Ear, nose and throat: No hearing loss, ear pain, nasal congestion, sore throat Cardiovascular: No chest pain, palpitations Respiratory:  No shortness of breath at rest or with exertion.   No wheezes GastrointestinaI: No nausea, vomiting, diarrhea, abdominal pain, fecal incontinence Genitourinary:  see above. Musculoskeletal:  Pain in right hip, and lower back Integumentary: No rash, pruritus.   She gets skin reactions with Plegridy Neurological: as above Psychiatric: No depression at this time.  No anxiety Endocrine: No palpitations, diaphoresis, change in appetite, change in weigh or increased thirst Hematologic/Lymphatic:  No anemia, purpura, petechiae. Allergic/Immunologic: No itchy/runny eyes, nasal congestion, recent allergic reactions, rashes  ALLERGIES: No Known Allergies  HOME MEDICATIONS: Outpatient Prescriptions Prior to Visit  Medication Sig Dispense Refill  . aspirin 81 MG tablet Take 81 mg by mouth daily.    . calcium carbonate (OS-CAL) 600 MG TABS Take 600 mg by mouth daily.    . cholecalciferol (VITAMIN D) 1000 UNITS tablet Take 1,000 Units by mouth daily.     . clonazePAM (KLONOPIN) 0.5 MG tablet Take 0.5 mg by mouth at bedtime.    . docusate sodium (COLACE) 100 MG capsule Take 200 mg by mouth at bedtime.    . fexofenadine (ALLEGRA) 180 MG tablet Take 180 mg by mouth daily.    . fluticasone (VERAMYST) 27.5 MCG/SPRAY nasal spray Place 2 sprays into the nose daily as needed.    . modafinil (PROVIGIL) 200 MG tablet Take 200 mg by mouth daily as needed.    . Multiple Vitamins-Minerals (MULTIVITAMIN WITH MINERALS) tablet Take 1 tablet by mouth daily.    [name] Kitchen acetaminophen (TYLENOL) 500 MG tablet Take 500 mg by mouth every 6 (six) hours as needed.    [name] Kitchen estradiol  (ESTRACE) 1 MG tablet Take 1 tablet (1 mg total) by mouth daily. 30 tablet 3  . estradiol (ESTRACE) 1 MG tablet Take 1 tablet (1 mg total) by mouth daily. 30 tablet 7  . etodolac (LODINE) 400 MG tablet Take 400 mg by mouth as needed.    . interferon beta-1b (BETASERON) 0.3 MG injection Inject 0.3 mg into the skin every other day.    . predniSONE (STERAPRED UNI-PAK) 10 MG tablet Take 10 mg by mouth daily as needed.    . traMADol (ULTRAM) 50 MG tablet Take 50 mg by mouth every 6 (six) hours as needed.     No facility-administered medications prior to visit.    PAST MEDICAL HISTORY: Past Medical History  Diagnosis Date  . Allergy   . Multiple sclerosis   . Hot flashes, menopausal 10/13/2011    Estradiol started   . Neuropathy   . Vision abnormalities     PAST SURGICAL HISTORY: Past Surgical History  Procedure Laterality Date  .  Lumbar fusion    . Abdominal hysterectomy  2002    partial    FAMILY HISTORY: Family History  Problem Relation Age of Onset  . Arthritis    . Hypertension    . Heart failure Mother   . Heart failure Father     SOCIAL HISTORY:  History   Social History  . Marital Status: Widowed    Spouse Name: N/A    Number of Children: N/A  . Years of Education: N/A   Occupational History  . retired    Social History Main Topics  . Smoking status: Former Smoker    Quit date: 08/05/1983  . Smokeless tobacco: Not on file  . Alcohol Use: 0.0 oz/week    0 Not specified per week     Comment: rarely  . Drug Use: No  . Sexual Activity: Not Currently    Birth Control/ Protection: Post-menopausal   Other Topics Concern  . Not on file   Social History Narrative   Regular Exercise-no   Widowed   Retired              Wagon Wheel:   08/04/14 1059  BP: 154/90  Pulse: 64  Resp: 14  Weight: 183 lb (83.008 kg)    Body mass index is 33.46 kg/(m^2).   General: The patient is well-developed and well-nourished and in no acute  distress  Eyes:  Funduscopic exam shows normal optic discs on the right but optic nerve pallor on the left. There are normal  retinal vessels.  Neck: The neck is supple, no carotid bruits are noted.  The neck is nontender.  Respiratory: The respiratory examination is clear.  Cardiovascular: The cardiovascular examination reveals a regular rate and rhythm, no murmurs, gallops or rubs are noted.  Skin: Extremities are without significant edema.  Musculoskeletal: She has tenderness over the trochanteric bursa on the right. There is no Faber sign.   Neurologic Exam  Mental status: The patient is alert and oriented x 3 at the time of the examination. The patient has apparent normal recent and remote memory, with an apparently normal attention span and concentration ability.   Speech is normal.  Cranial nerves: Extraocular movements are full. Pupils show 1+ APD.  Visual fields are full.  Facial symmetry is present. There is good facial sensation to soft touch bilaterally.Facial strength is normal.  Trapezius and sternocleidomastoid strength is normal. No dysarthria is noted.  The tongue is midline, and the patient has symmetric elevation of the soft palate. No obvious hearing deficits are noted.  Motor:  Muscle bulk is normal but tone is symmetric, more on the left.. Strength is  5 / 5 in all 4 extremities.   Sensory: Sensory testing is intact to pinprick, soft touch, vibration sensation, in the arms and she reports reduced left leg sensation to vibration.  Coordination: Cerebellar testing reveals good finger-nose-finger bilaterally and reduced heel-to-shin, left worse than right  Gait and station: Station and gait are normal. Tandem gait is normal. Romberg is negative.   Reflexes: Deep tendon reflexes are symmetric and normal bilaterally. Plantar responses are normal.    DIAGNOSTIC DATA (LABS, IMAGING, TESTING) - I reviewed patient records, labs, notes, testing and imaging myself where  available.  Lab Results  Component Value Date   WBC 4.6 11/03/2011   HGB 14.2 11/03/2011   HCT 42.1 11/03/2011   MCV 91.7 11/03/2011   PLT 171.0 11/03/2011      Component Value Date/Time  NA 141 11/03/2011 0920   K 4.6 11/03/2011 0920   CL 106 11/03/2011 0920   CO2 26 11/03/2011 0920   GLUCOSE 82 11/03/2011 0920   BUN 16 11/03/2011 0920   CREATININE 0.8 11/03/2011 0920   CALCIUM 9.5 11/03/2011 0920   PROT 6.7 11/03/2011 0920   ALBUMIN 4.3 11/03/2011 0920   AST 20 11/03/2011 0920   ALT 15 11/03/2011 0920   ALKPHOS 85 11/03/2011 0920   BILITOT 0.5 11/03/2011 0920   GFRNONAA 99.95 12/05/2009 0804   Lab Results  Component Value Date   CHOL 203* 11/03/2011   HDL 44.60 11/03/2011   LDLCALC 136* 12/05/2009   LDLDIRECT 130.2 11/03/2011   TRIG 185.0* 11/03/2011   CHOLHDL 5 11/03/2011   No results found for: HGBA1C No results found for: VITAMINB12 Lab Results  Component Value Date   TSH 3.80 11/03/2011       ASSESSMENT AND PLAN  Multiple sclerosis  Trochanteric bursitis of right hip  Adjustment disorder with mixed anxiety and depressed mood  Chronic fatigue  Urinary frequency  Abnormality of gait   In summary, Dawn [name] is a 45 showed woman with multiple sclerosis. Her diseases manifested by a spastic gait, fatigue, bladder dysfunction and dysesthesias. She also has a depression with some anxiety. Furthermore, she has hip pain that is most consistent with a trochanteric bursitis on the right.  For now, she will continue Plegridy. However, due to her significant skin reaction she may wish to go back to Betaseron or to Avonex. She is less interested in the oral agents due to her perception of their risks. Her mood issues are becoming more significant this year than they were in the past. I will start escitalopram 10 mg by mouth daily and consider referral for psychology  or change medicines depending on her response.  Using sterile technique, I injected the  right trochanteric bursa with 60 mg of Depo Medrol ([id number]; [id number]; 9/16; 20 mg wasted) in [place].  She tolerated the procedure well.  Return to see me in about 4 or 5 months but is advised to call sooner if she has new or worsening neurologic symptoms.   Zacari Stiff A. Felecia Shelling, MD, PhD 6/56/8127, 51:70 AM Certified in Neurology, Clinical Neurophysiology, Sleep Medicine, Pain Medicine and Neuroimaging  [facility] [address] [place], [place] [address] [phone]

## 2014-08-07 ENCOUNTER — Other Ambulatory Visit: Payer: Self-pay

## 2014-08-07 MED ORDER — PLEGRIDY 125 MCG/0.5ML ~~LOC~~ SOPN: 125.0000 ug | PEN_INJECTOR | SUBCUTANEOUS | Status: DC

## 2014-09-27 ENCOUNTER — Other Ambulatory Visit: Payer: Self-pay | Admitting: Neurology

## 2014-09-27 NOTE — Telephone Encounter (Signed)
Not on med list.  Would you like to prescribe?  Please advise.  Thank you.

## 2014-09-28 ENCOUNTER — Other Ambulatory Visit: Payer: Self-pay | Admitting: Neurology

## 2014-09-28 MED ORDER — MODAFINIL 200 MG PO TABS: 200.0000 mg | ORAL_TABLET | Freq: Every day | ORAL | Status: DC | PRN

## 2014-09-28 NOTE — Telephone Encounter (Signed)
Pt is calling requesting a refill for modafinil (PROVIGIL) 200 MG tablet.  Please call and advise.

## 2014-09-28 NOTE — Telephone Encounter (Signed)
Rx signed and faxed.

## 2014-11-21 DIAGNOSIS — Z0000 Encounter for general adult medical examination without abnormal findings: Secondary | ICD-10-CM | POA: Diagnosis not present

## 2014-11-21 DIAGNOSIS — Z9109 Other allergy status, other than to drugs and biological substances: Secondary | ICD-10-CM | POA: Diagnosis not present

## 2014-11-21 DIAGNOSIS — Z23 Encounter for immunization: Secondary | ICD-10-CM | POA: Diagnosis not present

## 2014-11-21 DIAGNOSIS — M2011 Hallux valgus (acquired), right foot: Secondary | ICD-10-CM | POA: Diagnosis not present

## 2014-11-21 DIAGNOSIS — G35 Multiple sclerosis: Secondary | ICD-10-CM | POA: Diagnosis not present

## 2014-11-21 DIAGNOSIS — K219 Gastro-esophageal reflux disease without esophagitis: Secondary | ICD-10-CM | POA: Diagnosis not present

## 2014-11-21 DIAGNOSIS — M25551 Pain in right hip: Secondary | ICD-10-CM | POA: Diagnosis not present

## 2014-11-28 DIAGNOSIS — R12 Heartburn: Secondary | ICD-10-CM | POA: Diagnosis not present

## 2014-12-14 ENCOUNTER — Telehealth: Payer: Self-pay

## 2014-12-14 MED ORDER — PLEGRIDY 125 MCG/0.5ML ~~LOC~~ SOPN: 125.0000 ug | PEN_INJECTOR | SUBCUTANEOUS | Status: DC

## 2014-12-14 NOTE — Telephone Encounter (Signed)
[facility] is requesting a Rx for Plegridy Pen 158mcg/0.5ml one injection every other week as directed.  Phone [phone], Fax [phone] or may be e-scribed.  Thank you.

## 2014-12-14 NOTE — Telephone Encounter (Signed)
Rx. escribed to [facility] per request/fim

## 2015-01-10 ENCOUNTER — Other Ambulatory Visit: Payer: Self-pay | Admitting: Neurology

## 2015-01-11 NOTE — Telephone Encounter (Signed)
Patient called checking on approval for refill. She is going out of town in the morning. Please call and advise. Patient can be reached at [phone].

## 2015-01-11 NOTE — Telephone Encounter (Signed)
Pharmacy verified this Rx was previously prescribed by Dr Felecia Shelling

## 2015-01-12 ENCOUNTER — Telehealth: Payer: Self-pay | Admitting: Neurology

## 2015-01-12 ENCOUNTER — Encounter: Payer: Self-pay | Admitting: *Deleted

## 2015-01-12 ENCOUNTER — Other Ambulatory Visit: Payer: Self-pay | Admitting: Neurology

## 2015-01-12 NOTE — Telephone Encounter (Signed)
I called back.  Spoke with [facility].  Verified Rx.  They will fill order as prescribed.

## 2015-01-12 NOTE — Telephone Encounter (Signed)
Dawn [name] called and requested that Rx. clonazePAM (KLONOPIN) 0.5 MG tablet be sent over again, he states that they did not received it. Please call and advise.

## 2015-01-12 NOTE — Progress Notes (Unsigned)
Clonazepam rx. up front [facility]/fim

## 2015-01-12 NOTE — Progress Notes (Unsigned)
Subjective:    Patient ID: Dawn [name] is a 69 y.o. female.  HPI {Common ambulatory SmartLinks:19316}  Review of Systems  Objective:  Neurologic Exam  Physical Exam  Assessment:   ***  Plan:   ***

## 2015-02-14 DIAGNOSIS — Z01419 Encounter for gynecological examination (general) (routine) without abnormal findings: Secondary | ICD-10-CM | POA: Diagnosis not present

## 2015-02-14 DIAGNOSIS — Z1231 Encounter for screening mammogram for malignant neoplasm of breast: Secondary | ICD-10-CM | POA: Diagnosis not present

## 2015-02-14 DIAGNOSIS — Z124 Encounter for screening for malignant neoplasm of cervix: Secondary | ICD-10-CM | POA: Diagnosis not present

## 2015-02-20 ENCOUNTER — Other Ambulatory Visit: Payer: Self-pay | Admitting: Obstetrics and Gynecology

## 2015-02-20 DIAGNOSIS — R928 Other abnormal and inconclusive findings on diagnostic imaging of breast: Secondary | ICD-10-CM

## 2015-02-22 ENCOUNTER — Ambulatory Visit
Admission: RE | Admit: 2015-02-22 | Discharge: 2015-02-22 | Disposition: A | Discharge status: Home / Self Care | Payer: Medicare Other | Source: Ambulatory Visit | Attending: Obstetrics and Gynecology | Admitting: Obstetrics and Gynecology

## 2015-02-22 DIAGNOSIS — N63 Unspecified lump in breast: Secondary | ICD-10-CM | POA: Diagnosis not present

## 2015-02-22 DIAGNOSIS — R928 Other abnormal and inconclusive findings on diagnostic imaging of breast: Secondary | ICD-10-CM

## 2015-02-22 DIAGNOSIS — R921 Mammographic calcification found on diagnostic imaging of breast: Secondary | ICD-10-CM | POA: Diagnosis not present

## 2015-03-08 ENCOUNTER — Other Ambulatory Visit: Payer: Self-pay | Admitting: Gastroenterology

## 2015-03-08 DIAGNOSIS — K449 Diaphragmatic hernia without obstruction or gangrene: Secondary | ICD-10-CM | POA: Diagnosis not present

## 2015-03-08 DIAGNOSIS — K3189 Other diseases of stomach and duodenum: Secondary | ICD-10-CM | POA: Diagnosis not present

## 2015-03-08 DIAGNOSIS — K2950 Unspecified chronic gastritis without bleeding: Secondary | ICD-10-CM | POA: Diagnosis not present

## 2015-03-08 DIAGNOSIS — R12 Heartburn: Secondary | ICD-10-CM | POA: Diagnosis not present

## 2015-04-23 ENCOUNTER — Telehealth: Payer: Self-pay | Admitting: Neurology

## 2015-04-23 MED ORDER — OXYBUTYNIN CHLORIDE 5 MG PO TABS: 5.0000 mg | ORAL_TABLET | Freq: Two times a day (BID) | ORAL | Status: DC

## 2015-04-23 NOTE — Telephone Encounter (Signed)
I have spoken with Dawn [name] this afternoon--and per [name], have offered Oxybutynin 5mg   po bid.  She sts. she does not think she has tried this, is agreeable to trying it.  Rx. escribed to [facility] [address] per her request/fim

## 2015-04-23 NOTE — Telephone Encounter (Signed)
Pt called inquiring if there is an alternative to VESICARE 5 MG tablet. She has medicare now and it has doubled in cost to $120/mth. Please call and advise at [phone].

## 2015-05-02 ENCOUNTER — Encounter: Payer: Self-pay | Admitting: *Deleted

## 2015-05-02 ENCOUNTER — Telehealth: Payer: Self-pay | Admitting: Neurology

## 2015-05-02 NOTE — Telephone Encounter (Signed)
Pt called stating she needs letter sent to [place] with verification that she has MS for assistance. Please call and advise. Patient can be reached at [phone]. (pt's phone connection was breaking up- could not get more information)

## 2015-05-02 NOTE — Telephone Encounter (Signed)
Letter printed, awaiting [name] sig/fim 

## 2015-05-02 NOTE — Telephone Encounter (Signed)
I have spoken with Dawn [name] this morning--she needs a letter for the [place] confirming that she has MS.  Would like letter faxed to [phone], attn: Constance.  She is aware [name] is out of the office and that letter will be faxed on 10-17 when he returns/fim

## 2015-05-07 NOTE — Telephone Encounter (Signed)
Letter has been faxed back to the [place], with fax confirmation received/fim

## 2015-06-10 DIAGNOSIS — Z23 Encounter for immunization: Secondary | ICD-10-CM | POA: Diagnosis not present

## 2015-08-03 ENCOUNTER — Encounter: Payer: Self-pay | Admitting: *Deleted

## 2015-08-06 ENCOUNTER — Other Ambulatory Visit: Payer: Self-pay | Admitting: Neurology

## 2015-08-07 ENCOUNTER — Encounter: Payer: Self-pay | Admitting: *Deleted

## 2015-08-07 NOTE — Progress Notes (Signed)
[facility]epam rx[facility] [address] fax # [phone]/fim

## 2015-08-15 ENCOUNTER — Telehealth: Payer: Self-pay | Admitting: Neurology

## 2015-08-15 NOTE — Telephone Encounter (Signed)
I have spoken with Dawn [name] to initiate a pa for Plegridy.  Tried and failed meds: Betaseron, Tecfidera.  Dawn [name] will send info for med review and approval will be given via fax/fim

## 2015-08-15 NOTE — Telephone Encounter (Signed)
Ref # [id number]

## 2015-08-15 NOTE — Telephone Encounter (Signed)
[facility] [phone] called requesting PA for PLEGRIDY 125 MCG/0.5ML SOPN. Call [phone].

## 2015-08-20 ENCOUNTER — Other Ambulatory Visit: Payer: Self-pay | Admitting: Neurology

## 2015-08-20 ENCOUNTER — Encounter: Payer: Self-pay | Admitting: Neurology

## 2015-08-20 ENCOUNTER — Ambulatory Visit (INDEPENDENT_AMBULATORY_CARE_PROVIDER_SITE_OTHER): Payer: Medicare Other | Admitting: Neurology

## 2015-08-20 VITALS — BP 142/90 | HR 72 | Resp 16 | Ht 62.0 in | Wt 192.6 lb

## 2015-08-20 DIAGNOSIS — R5382 Chronic fatigue, unspecified: Secondary | ICD-10-CM | POA: Diagnosis not present

## 2015-08-20 DIAGNOSIS — R350 Frequency of micturition: Secondary | ICD-10-CM | POA: Diagnosis not present

## 2015-08-20 DIAGNOSIS — G35 Multiple sclerosis: Secondary | ICD-10-CM | POA: Diagnosis not present

## 2015-08-20 DIAGNOSIS — R269 Unspecified abnormalities of gait and mobility: Secondary | ICD-10-CM | POA: Diagnosis not present

## 2015-08-20 MED ORDER — TAMSULOSIN HCL 0.4 MG PO CAPS: 0.4000 mg | ORAL_CAPSULE | Freq: Every day | ORAL | Status: DC

## 2015-08-20 MED ORDER — PHENTERMINE HCL 37.5 MG PO CAPS: 37.5000 mg | ORAL_CAPSULE | ORAL | Status: DC

## 2015-08-20 MED ORDER — SOLIFENACIN SUCCINATE 5 MG PO TABS: 5.0000 mg | ORAL_TABLET | Freq: Every day | ORAL | Status: DC

## 2015-08-20 NOTE — Telephone Encounter (Signed)
Plegridy has been denied.  Pt. was seen in the office today and signed Aubagio start form; hopefully will have an easier time getting this approved/fim

## 2015-08-20 NOTE — Addendum Note (Signed)
Addended by: Arlice Colt A on: 08/20/2015 01:26 PM   Modules accepted: Orders

## 2015-08-20 NOTE — Progress Notes (Signed)
[facility]  PATIENT: Dawn [name] DOB: 27-Nov-1945  REFERRING CLINICIAN: Aura Dials HISTORY FROM: Patient REASON FOR VISIT: MS   HISTORICAL  CHIEF COMPLAINT:  Chief Complaint  Patient presents with  . Multiple Sclerosis    Sts. she continues to have inj. site rxn's with Plegridy.  She also c/o increased memory loss, more generalized pain, more trouble finding words./fim    HISTORY OF PRESENT ILLNESS:  Dawn [name] is a 70 year old woman who was diagnosed with multiple sclerosis in 2001.    She is currently on Plegridy but has noted fevers and chills ands skin reactions and wishes to switch.    She also has more headaches with it.    She had been on Betaseron biut had a lot of skin reactions and stopped.    She could not tolerate Tecfidera.     She has had a lot of skin issues with   MS History:    In 1994, she had an episode of severe vertigo with gait ataxia. An MRI at that time was reportedly normal. The symptoms were felt to be due to allergies. About 7 years later she had vertigo, gait ataxia and diplopia. Also her left leg was giving out. Short after that she had a hysterectomy. Postoperatively, she was numb in both legs and she had a lumbar puncture which showed changes consistent with MS. Then, an MRI of the brain was performed showing changes consistent with MS. She was referred to Dr. Erling Cruz who her on Betaseron. Last year, she switched from Betaseron to Tecfidera, in the hope that she would be able to avoid shots as she was having severe skin reactions with knots.    However, she had a lot of difficulty tolerating Tecfidera have the severe GI symptoms including vomiting and GERD.   Because of that, she was switched to Plegridy since it has fewer shots. She has had flu-like reactions and more skin reactions with Plegridy.  Gait/strength/sensation:     She has mildly wide gait but does not need to use a cane for short distances.   However, she uses a  Rollator for longer distances.   .   She also reports bilateral left > right leg dysesthesia.    He has had dysesthesias in the leg and feet for many years. She stopped gabapentin as it had not helped.   Sometimes she gets sharp pains superimposed on the more constant dysesthesias.   She denies much weakness but has muscle stiffness and pain.  .  Bladder:   She has urinary frequency .   Sometimes she has hesitancy and is not sure she completely empties.     Vesicare helped frequency better than oxybtynin.   She has had several UTI's in 2016.     Vision   She reports that her vision is doing well.   No diplopia or blurriness.   No eye pain.     Fatigue/sleep:   She reports a lot of difficulty with fatigue that builds up as a day goes on. This is both physical and cognitive.   Provigil helps but may affect sleep.   She has some difficulty with sleep onset insomnia > sleep maintenance.   However, due to nocturia, she is waking up more.   Mood/cognition:    She had some depression but Lexapro poorly tolerated so she stopped.   She gets apathetic, especially when she is more tired. Cognitively, she has difficulty with short-term memory as well as with  verbal fluency  Other pain:   PT has helped her lower back and leg pain. She has known degenerative disc changes and spondylosis in the lower back. She believes she has received some benefit from physical therapy and she tries to do the exercises regularly. The right hip pain improved after injection into the trochanteric bursa.   Other:   She is gaining weight, about 15 pounds last year.   She often feels hungry throughout the day.   REVIEW OF SYSTEMS:  Constitutional: No fevers, chills, sweats, or change in appetite.   Reports fatigue Eyes: No visual changes, double vision, eye pain Ear, nose and throat: No hearing loss, ear pain, nasal congestion, sore throat Cardiovascular: No chest pain, palpitations Respiratory:  No shortness of breath at rest or  with exertion.   No wheezes GastrointestinaI: No nausea, vomiting, diarrhea, abdominal pain, fecal incontinence Genitourinary:  see above. Musculoskeletal:  Pain in right hip (now milder), and lower back Integumentary: No rash, pruritus.   She gets skin reactions with Plegridy Neurological: as above Psychiatric: No depression at this time.  No anxiety Endocrine: No palpitations, diaphoresis, change in appetite, change in weigh or increased thirst Hematologic/Lymphatic:  No anemia, purpura, petechiae. Allergic/Immunologic: No itchy/runny eyes, nasal congestion, recent allergic reactions, rashes  ALLERGIES: No Known Allergies  HOME MEDICATIONS: Outpatient Prescriptions Prior to Visit  Medication Sig Dispense Refill  . aspirin 81 MG tablet Take 81 mg by mouth daily.    . calcium carbonate (OS-CAL) 600 MG TABS Take 600 mg by mouth daily.    . cholecalciferol (VITAMIN D) 1000 UNITS tablet Take 1,000 Units by mouth daily.     . clonazePAM (KLONOPIN) 0.5 MG tablet TAKE 1 TABLET AT BEDTIME 30 tablet 0  . fexofenadine (ALLEGRA) 180 MG tablet Take 180 mg by mouth daily.    . fluticasone (VERAMYST) 27.5 MCG/SPRAY nasal spray Place 2 sprays into the nose daily as needed.    [name] Kitchen MIMVEY LO 0.5-0.1 MG per tablet Take 1 tablet by mouth daily.  12  . modafinil (PROVIGIL) 200 MG tablet Take 1 tablet (200 mg total) by mouth daily as needed. 30 tablet 5  . Multiple Vitamins-Minerals (MULTIVITAMIN WITH MINERALS) tablet Take 1 tablet by mouth daily.    [name] Kitchen omeprazole (PRILOSEC) 40 MG capsule     . oxybutynin (DITROPAN) 5 MG tablet Take 1 tablet (5 mg total) by mouth 2 (two) times daily. 60 tablet 3  . PLEGRIDY 125 MCG/0.5ML SOPN Inject 125 mcg into the skin every 14 (fourteen) days. 6 pen 3  . docusate sodium (COLACE) 100 MG capsule Take 200 mg by mouth at bedtime. Reported on 08/20/2015    . escitalopram (LEXAPRO) 10 MG tablet Take 1 tablet (10 mg total) by mouth daily. (Patient not taking: Reported on  08/20/2015) 30 tablet 11  . progesterone (PROMETRIUM) 100 MG capsule TAKE 1 CAPSULE BY MOUTH AT BEDTIME (Patient not taking: Reported on 08/20/2015) 90 capsule 3   No facility-administered medications prior to visit.    PAST MEDICAL HISTORY: Past Medical History  Diagnosis Date  . Allergy   . Multiple sclerosis ([place])   . Hot flashes, menopausal 10/13/2011    Estradiol started   . Neuropathy ([place])   . Vision abnormalities     PAST SURGICAL HISTORY: Past Surgical History  Procedure Laterality Date  . Lumbar fusion    . Abdominal hysterectomy  2002    partial    FAMILY HISTORY: Family History  Problem Relation Age of Onset  .  Arthritis    . Hypertension    . Heart failure Mother   . Heart failure Father     SOCIAL HISTORY:  Social History   Social History  . Marital Status: Widowed    Spouse Name: N/A  . Number of Children: N/A  . Years of Education: N/A   Occupational History  . retired    Social History Main Topics  . Smoking status: Former Smoker    Quit date: 08/05/1983  . Smokeless tobacco: Not on file  . Alcohol Use: 0.0 oz/week    0 Standard drinks or equivalent per week     Comment: rarely  . Drug Use: No  . Sexual Activity: Not Currently    Birth Control/ Protection: Post-menopausal   Other Topics Concern  . Not on file   Social History Narrative   Regular Exercise-no   Widowed   Retired              PHYSICAL EXAM  Filed Vitals:   08/20/15 1013  BP: 142/90  Pulse: 72  Resp: 16  Height: 5\' 2"  (1.575 m)  Weight: 192 lb 9.6 oz (87.363 kg)    Body mass index is 35.22 kg/(m^2).   General: The patient is well-developed and well-nourished and in no acute distress  Eyes:  Funduscopic exam shows normal optic discs on the right but optic nerve pallor on the left. There are normal  retinal vessels.  Neck: The neck is supple, no carotid bruits are noted.  The neck is nontender.  Skin: Extremities are without significant  edema.   Neurologic Exam  Mental status: The patient is alert and oriented x 3 at the time of the examination. The patient has apparent normal recent and remote memory, with an apparently normal attention span and concentration ability.   Speech is normal.  Cranial nerves: Extraocular movements are full.    Facial symmetry is present. There is good facial sensation to soft touch bilaterally.Facial strength is normal.  Trapezius and sternocleidomastoid strength is normal. No dysarthria is noted.  The tongue is midline, and the patient has symmetric elevation of the soft palate. No obvious hearing deficits are noted.  Motor:  Muscle bulk is normal but tone is symmetric, more on the left.. Strength is  5 / 5 in all 4 extremities.   Sensory: Sensory testing is intact to  touch, vibration sensation, in the arms and she reports reduced left leg sensation to vibration.  Coordination: Cerebellar testing reveals good finger-nose-finger bilaterally and reduced heel-to-shin, left worse than right  Gait and station: Station is normal and gait is mildly wide. Tandem gait is wide. Romberg is negative.   Reflexes: Deep tendon reflexes are symmetric and normal bilaterally.      DIAGNOSTIC DATA (LABS, IMAGING, TESTING) - I reviewed patient records, labs, notes, testing and imaging myself where available.  Lab Results  Component Value Date   WBC 4.6 11/03/2011   HGB 14.2 11/03/2011   HCT 42.1 11/03/2011   MCV 91.7 11/03/2011   PLT 171.0 11/03/2011      Component Value Date/Time   NA 141 11/03/2011 0920   K 4.6 11/03/2011 0920   CL 106 11/03/2011 0920   CO2 26 11/03/2011 0920   GLUCOSE 82 11/03/2011 0920   BUN 16 11/03/2011 0920   CREATININE 0.8 11/03/2011 0920   CALCIUM 9.5 11/03/2011 0920   PROT 6.7 11/03/2011 0920   ALBUMIN 4.3 11/03/2011 0920   AST 20 11/03/2011 0920   ALT 15 11/03/2011  0920   ALKPHOS 85 11/03/2011 0920   BILITOT 0.5 11/03/2011 0920   GFRNONAA 99.95 12/05/2009 0804    Lab Results  Component Value Date   CHOL 203* 11/03/2011   HDL 44.60 11/03/2011   LDLCALC 136* 12/05/2009   LDLDIRECT 130.2 11/03/2011   TRIG 185.0* 11/03/2011   CHOLHDL 5 11/03/2011   No results found for: HGBA1C No results found for: VITAMINB12 Lab Results  Component Value Date   TSH 3.80 11/03/2011       ASSESSMENT AND PLAN  MULTIPLE SCLEROSIS, PROGRESSIVE/RELAPSING  Abnormality of gait  Chronic fatigue  Urinary frequency   1.  In the past, she has been on Betaseron, Tecfidera and Plegridy. She cannot tolerate shots as she has had skin reactions with all of them. Therefore, she is not a candidate for Copaxone or metoprolol. She did not tolerate Tecfidera because of GI issues. Therefore, I would like her to switch to Aubagio as it is generally tolerated better. 2.   Check CBC, hepatic panel and TB serology. 3.   She has had more difficulty with urinary hesitancy. We will start tamsulosin 0.4 mg daily.   She also did better on Vesicare than on oxybutynin and we will switch her back. 4.    Phentermine for weight loss Return to see me in about 4 or 5 months but is advised to call sooner if she has new or worsening neurologic symptoms.  45 minute face-to-face evaluation with greater than one half of the time counseling coordinating care about her MS and symptoms.   Richard A. Felecia Shelling, MD, PhD [id number], [id number] AM Certified in Neurology, Clinical Neurophysiology, Sleep Medicine, Pain Medicine and Neuroimaging  [facility] [address], [place], [place] [address] [phone]

## 2015-08-21 LAB — CBC WITH DIFFERENTIAL/PLATELET
Basophils Absolute: 0 10*3/uL (ref 0.0–0.2)
Basos: 1 %
EOS (ABSOLUTE): 0.1 10*3/uL (ref 0.0–0.4)
EOS: 3 %
HEMATOCRIT: 39.3 % (ref 34.0–46.6)
Hemoglobin: 13.3 g/dL (ref 11.1–15.9)
Immature Grans (Abs): 0 10*3/uL (ref 0.0–0.1)
Immature Granulocytes: 0 %
Lymphocytes Absolute: 0.8 10*3/uL (ref 0.7–3.1)
Lymphs: 19 %
MCH: 28.8 pg (ref 26.6–33.0)
MCHC: 33.8 g/dL (ref 31.5–35.7)
MCV: 85 fL (ref 79–97)
MONOCYTES: 10 %
Monocytes Absolute: 0.4 10*3/uL (ref 0.1–0.9)
NEUTROS ABS: 3 10*3/uL (ref 1.4–7.0)
Neutrophils: 67 %
Platelets: 174 10*3/uL (ref 150–379)
RBC: 4.62 x10E6/uL (ref 3.77–5.28)
RDW: 15.4 % (ref 12.3–15.4)
WBC: 4.4 10*3/uL (ref 3.4–10.8)

## 2015-08-21 LAB — HEPATIC FUNCTION PANEL
ALBUMIN: 4.3 g/dL (ref 3.6–4.8)
ALK PHOS: 112 IU/L (ref 39–117)
ALT: 30 IU/L (ref 0–32)
AST: 30 IU/L (ref 0–40)
BILIRUBIN, DIRECT: 0.09 mg/dL (ref 0.00–0.40)
Bilirubin Total: 0.3 mg/dL (ref 0.0–1.2)
Total Protein: 6.6 g/dL (ref 6.0–8.5)

## 2015-08-23 LAB — QUANTIFERON IN TUBE
QFT TB AG MINUS NIL VALUE: 0 IU/mL
QUANTIFERON MITOGEN VALUE: 6.31 [IU]/mL
QUANTIFERON TB AG VALUE: 0.03 IU/mL
QUANTIFERON TB GOLD: NEGATIVE
Quantiferon Nil Value: 0.03 IU/mL

## 2015-08-23 LAB — QUANTIFERON TB GOLD ASSAY (BLOOD)

## 2015-08-24 ENCOUNTER — Telehealth: Payer: Self-pay | Admitting: *Deleted

## 2015-08-24 NOTE — Telephone Encounter (Signed)
I have spoken with Dawn [name] this morning and per [name], advised that labwork was ok, so I have faxed Aubagio star form to [facility] this morning.  She verbalized understanding of same, is agreeable with this plan.  Sts. she would like [name] to know she feels much better since last ov.  I have passed update along to [name]/fim

## 2015-08-24 NOTE — Telephone Encounter (Signed)
-----   Message from Britt Bottom, MD sent at 08/24/2015  8:29 AM EST ----- Labs are fine. We can start Aubagio.

## 2015-08-31 ENCOUNTER — Telehealth: Payer: Self-pay | Admitting: Neurology

## 2015-08-31 NOTE — Telephone Encounter (Signed)
Teneshia with [facility] [phone] called inquiring about PA that faxed on 08/22/15 for plegridy pen 154mcg. Please call

## 2015-08-31 NOTE — Telephone Encounter (Signed)
PC with [facility] today and advised Plegridy pa was not returned b/c pt. is no longer on Plegridy/fim

## 2015-09-05 ENCOUNTER — Encounter: Payer: Self-pay | Admitting: *Deleted

## 2015-09-05 NOTE — Telephone Encounter (Signed)
PA was just faxed today, with fax confirmation./fim

## 2015-09-05 NOTE — Telephone Encounter (Signed)
Gabriella[facility] [phone] called to advise insurance company has not received PA for AUBAGIO.

## 2015-09-06 ENCOUNTER — Encounter: Payer: Self-pay | Admitting: *Deleted

## 2015-09-12 ENCOUNTER — Telehealth: Payer: Self-pay | Admitting: Neurology

## 2015-09-12 NOTE — Telephone Encounter (Signed)
Pt called sts she started Aubagio on 09/10/15. She sts she needs liver test. Please call

## 2015-09-12 NOTE — Telephone Encounter (Signed)
LMOM that first set of lft's will be due around 10-08-15.  I'll call her with a reminder when it is time to come in for labwork.  She does not need to return this call unless she has questions/fim

## 2015-10-05 ENCOUNTER — Telehealth: Payer: Self-pay | Admitting: Neurology

## 2015-10-05 ENCOUNTER — Other Ambulatory Visit: Payer: Self-pay | Admitting: *Deleted

## 2015-10-05 DIAGNOSIS — G35 Multiple sclerosis: Secondary | ICD-10-CM

## 2015-10-05 DIAGNOSIS — Z79899 Other long term (current) drug therapy: Secondary | ICD-10-CM

## 2015-10-05 NOTE — Telephone Encounter (Signed)
I have spoken with Dawn [name] and reminded her that lft's are due next week.  She verbalized understanding of same.  Orders in epic/fim

## 2015-10-05 NOTE — Telephone Encounter (Signed)
I have spoken with [facility]  She has new c/o left hand and neck pain.  She is applying heat and taking meds as rx'd.  Appt. given next week and if pain resolves before then she will cancel./fim

## 2015-10-05 NOTE — Telephone Encounter (Signed)
Patient called to advise she is having pain in left hand and right shoulder

## 2015-10-08 ENCOUNTER — Telehealth: Payer: Self-pay | Admitting: *Deleted

## 2015-10-08 NOTE — Telephone Encounter (Signed)
[place].  She has an appt. for Fiday 10-12-15, but was interested in a sooner appt. if one became available.  At this time, there are now 2 appt's open on Wednesday, so she can move appt. up if she likes.Hilton Cork

## 2015-10-10 ENCOUNTER — Encounter: Payer: Self-pay | Admitting: Neurology

## 2015-10-10 ENCOUNTER — Ambulatory Visit (INDEPENDENT_AMBULATORY_CARE_PROVIDER_SITE_OTHER): Payer: Medicare Other | Admitting: Neurology

## 2015-10-10 VITALS — BP 132/80 | HR 66 | Resp 18 | Ht 62.0 in | Wt 186.2 lb

## 2015-10-10 DIAGNOSIS — M7061 Trochanteric bursitis, right hip: Secondary | ICD-10-CM

## 2015-10-10 DIAGNOSIS — R350 Frequency of micturition: Secondary | ICD-10-CM | POA: Diagnosis not present

## 2015-10-10 DIAGNOSIS — M79601 Pain in right arm: Secondary | ICD-10-CM

## 2015-10-10 DIAGNOSIS — M79602 Pain in left arm: Secondary | ICD-10-CM | POA: Diagnosis not present

## 2015-10-10 DIAGNOSIS — R5382 Chronic fatigue, unspecified: Secondary | ICD-10-CM

## 2015-10-10 DIAGNOSIS — Z79899 Other long term (current) drug therapy: Secondary | ICD-10-CM

## 2015-10-10 DIAGNOSIS — F4323 Adjustment disorder with mixed anxiety and depressed mood: Secondary | ICD-10-CM

## 2015-10-10 DIAGNOSIS — G35 Multiple sclerosis: Secondary | ICD-10-CM | POA: Diagnosis not present

## 2015-10-10 DIAGNOSIS — M5126 Other intervertebral disc displacement, lumbar region: Secondary | ICD-10-CM

## 2015-10-10 DIAGNOSIS — R269 Unspecified abnormalities of gait and mobility: Secondary | ICD-10-CM | POA: Diagnosis not present

## 2015-10-10 DIAGNOSIS — R200 Anesthesia of skin: Secondary | ICD-10-CM

## 2015-10-10 MED ORDER — CIPROFLOXACIN HCL 500 MG PO TABS: 500.0000 mg | ORAL_TABLET | Freq: Two times a day (BID) | ORAL | Status: DC

## 2015-10-10 MED ORDER — CLONAZEPAM 0.5 MG PO TABS: 0.5000 mg | ORAL_TABLET | Freq: Every day | ORAL | Status: DC

## 2015-10-10 MED ORDER — PHENTERMINE HCL 15 MG PO CAPS: 15.0000 mg | ORAL_CAPSULE | ORAL | Status: DC

## 2015-10-10 NOTE — Progress Notes (Signed)
[facility]  PATIENT: Dawn [name] DOB: Nov 24, 1945  REFERRING CLINICIAN: Aura Dials HISTORY FROM: Patient REASON FOR VISIT: MS   HISTORICAL  CHIEF COMPLAINT:  Chief Complaint  Patient presents with  . Multiple Sclerosis    She has been on Aubagio for one month and sts. she tolerates it well.  Sts. onset last week of more left hand and right wrist pain.  Denies known injury.[place]    HISTORY OF PRESENT ILLNESS:  Dawn [name] is a 70 year old woman who was diagnosed with multiple sclerosis in 2001.    She is on Aubagio and she tolerates it well.  The first week she had HA and diarrhea but no longer has either symptom.   She could not tolerate Tecfidera and had tolerability issues with Plegridy and Betaseron earlier.  Hand pain:   She notes pain in boith hands into the fingers.   She feels weakness in both hands.     Gait/strength/sensation:     She has mildly wide gait but does not need to use a cane for short distances.   However, she uses a Rollator for longer distances.   .   She also reports bilateral left > right leg dysesthesia.    He has had dysesthesias in the leg and feet for many years. She stopped gabapentin as it had not helped.   Sometimes she gets sharp pains superimposed on the more constant dysesthesias.   She denies much weakness but has muscle stiffness and pain.  .  Bladder:   She has urinary frequency .   Sometimes she has hesitancy and is not sure she completely empties.     Vesicare helped frequency better than oxybtynin.   She has had several UTI's in 2016.   She has tried tamsulosin but still gets UTIs    She is emptying better though.  Vision   She reports that her vision is doing well.   No diplopia or blurriness.   No eye pain.     Fatigue/sleep:   She has fatigue that increases as a day goes on. This is both physical and cognitive.   Provigil helps but may affect sleep. Phentermine kept her awake at night, so she stopped.    She has  some difficulty with sleep onset insomnia > sleep maintenance.   However, due to nocturia, she is waking up more.   Mood/cognition:    She had some depression but Lexapro poorly tolerated so she stopped.   She gets apathetic, especially when she is more tired. Cognitively, she has difficulty with short-term memory as well as with verbal fluency  LBP pain:   She has axial back pain that sometimes shoots down her legs.    . The right hip pain improved after injection into the trochanteric bursa.   PT helped the back pain some   MS History:    In 1994, she had an episode of severe vertigo with gait ataxia. An MRI at that time was reportedly normal. The symptoms were felt to be due to allergies. About 7 years later she had vertigo, gait ataxia and diplopia. Also her left leg was giving out. Short after that she had a hysterectomy. Postoperatively, she was numb in both legs and she had a lumbar puncture which showed changes consistent with MS. Then, an MRI of the brain was performed showing changes consistent with MS. She was referred to Dr. Erling Cruz who her on Betaseron. Last year, she switched from Betaseron to [place], in  the hope that she would be able to avoid shots as she was having severe skin reactions with knots.    However, she had a lot of difficulty tolerating Tecfidera and swithced to [place] in early 2016 and to Aubagio late 2016    REVIEW OF SYSTEMS:  Constitutional: No fevers, chills, sweats, or change in appetite.   Reports fatigue Eyes: No visual changes, double vision, eye pain Ear, nose and throat: No hearing loss, ear pain, nasal congestion, sore throat Cardiovascular: No chest pain, palpitations Respiratory:  No shortness of breath at rest or with exertion.   No wheezes GastrointestinaI: No nausea, vomiting, diarrhea, abdominal pain, fecal incontinence Genitourinary:  see above. Musculoskeletal:  Pain in right hip (now milder), and lower back Integumentary: No rash, pruritus.    She gets skin reactions with Plegridy Neurological: as above Psychiatric: No depression at this time.  No anxiety Endocrine: No palpitations, diaphoresis, change in appetite, change in weigh or increased thirst Hematologic/Lymphatic:  No anemia, purpura, petechiae. Allergic/Immunologic: No itchy/runny eyes, nasal congestion, recent allergic reactions, rashes  ALLERGIES: No Known Allergies  HOME MEDICATIONS: Outpatient Prescriptions Prior to Visit  Medication Sig Dispense Refill  . aspirin 81 MG tablet Take 81 mg by mouth daily.    . calcium carbonate (OS-CAL) 600 MG TABS Take 600 mg by mouth daily.    . cholecalciferol (VITAMIN D) 1000 UNITS tablet Take 1,000 Units by mouth daily.     . clonazePAM (KLONOPIN) 0.5 MG tablet TAKE 1 TABLET AT BEDTIME 30 tablet 0  . docusate sodium (COLACE) 100 MG capsule Take 200 mg by mouth at bedtime. Reported on 08/20/2015    . escitalopram (LEXAPRO) 10 MG tablet Take 1 tablet (10 mg total) by mouth daily. (Patient not taking: Reported on 08/20/2015) 30 tablet 11  . fexofenadine (ALLEGRA) 180 MG tablet Take 180 mg by mouth daily.    . fluticasone (VERAMYST) 27.5 MCG/SPRAY nasal spray Place 2 sprays into the nose daily as needed.    [name] Kitchen MIMVEY LO 0.5-0.1 MG per tablet Take 1 tablet by mouth daily.  12  . modafinil (PROVIGIL) 200 MG tablet Take 1 tablet (200 mg total) by mouth daily as needed. 30 tablet 5  . Multiple Vitamins-Minerals (MULTIVITAMIN WITH MINERALS) tablet Take 1 tablet by mouth daily.    [name] Kitchen omeprazole (PRILOSEC) 40 MG capsule     . oxybutynin (DITROPAN) 5 MG tablet Take 1 tablet (5 mg total) by mouth 2 (two) times daily. 60 tablet 3  . phentermine 37.5 MG capsule Take 1 capsule (37.5 mg total) by mouth every morning. 30 capsule 5  . progesterone (PROMETRIUM) 100 MG capsule TAKE 1 CAPSULE BY MOUTH AT BEDTIME (Patient not taking: Reported on 08/20/2015) 90 capsule 3  . solifenacin (VESICARE) 5 MG tablet Take 1 tablet (5 mg total) by mouth daily. 30  tablet 5  . tamsulosin (FLOMAX) 0.4 MG CAPS capsule Take 1 capsule (0.4 mg total) by mouth daily. 30 capsule 11  . Teriflunomide 14 MG TABS Take 14 mg by mouth daily.     No facility-administered medications prior to visit.    PAST MEDICAL HISTORY: Past Medical History  Diagnosis Date  . Allergy   . Multiple sclerosis ([place])   . Hot flashes, menopausal 10/13/2011    Estradiol started   . Neuropathy ([place])   . Vision abnormalities     PAST SURGICAL HISTORY: Past Surgical History  Procedure Laterality Date  . Lumbar fusion    . Abdominal hysterectomy  2002  partial    FAMILY HISTORY: Family History  Problem Relation Age of Onset  . Arthritis    . Hypertension    . Heart failure Mother   . Heart failure Father     SOCIAL HISTORY:  Social History   Social History  . Marital Status: Widowed    Spouse Name: N/A  . Number of Children: N/A  . Years of Education: N/A   Occupational History  . retired    Social History Main Topics  . Smoking status: Former Smoker    Quit date: 08/05/1983  . Smokeless tobacco: Not on file  . Alcohol Use: 0.0 oz/week    0 Standard drinks or equivalent per week     Comment: rarely  . Drug Use: No  . Sexual Activity: Not Currently    Birth Control/ Protection: Post-menopausal   Other Topics Concern  . Not on file   Social History Narrative   Regular Exercise-no   Widowed   Retired              PHYSICAL EXAM  Filed Vitals:   10/10/15 1512  BP: 132/80  Pulse: 66  Resp: 18  Height: 5\' 2"  (1.575 m)  Weight: 186 lb 3.2 oz (84.46 kg)    Body mass index is 34.05 kg/(m^2).   General: The patient is well-developed and well-nourished and in no acute distress  Neurologic Exam  Mental status: The patient is alert and oriented x 3 at the time of the examination. The patient has apparent normal recent and remote memory, with an apparently normal attention span and concentration ability.   Speech is normal.  Cranial  nerves: Extraocular movements are full.    Facial symmetry is present. There is good facial sensation to soft touch bilaterally.Facial strength is normal.  Trapezius and sternocleidomastoid strength is normal. No dysarthria is noted.  The tongue is midline, and the patient has symmetric elevation of the soft palate. No obvious hearing deficits are noted.  Motor:  Muscle bulk is normal but tone is symmetric, more on the left.. Strength is  4/5 in the left APB muscl, 4+/5 right APB and 4/5 right interosseous muscles.   .   Sensory: Sensory testing is intact to  touch, vibration sensation, in the arms and she reports reduced left leg sensation to vibration.  Coordination: Cerebellar testing reveals reduced right finger-nose-finger and reduced heel-to-shin, left worse than right  Gait and station: Station is normal and gait is mildly wide. Tandem gait is very wide. Romberg is negative.   Reflexes: Deep tendon reflexes are symmetric and normal bilaterally.      DIAGNOSTIC DATA (LABS, IMAGING, TESTING) - I reviewed patient records, labs, notes, testing and imaging myself where available.  Lab Results  Component Value Date   WBC CANCELED 08/20/2015   HGB 14.2 11/03/2011   HCT CANCELED 08/20/2015   MCV 85 08/20/2015   PLT CANCELED 08/20/2015      Component Value Date/Time   NA 141 11/03/2011 0920   K 4.6 11/03/2011 0920   CL 106 11/03/2011 0920   CO2 26 11/03/2011 0920   GLUCOSE 82 11/03/2011 0920   BUN 16 11/03/2011 0920   CREATININE 0.8 11/03/2011 0920   CALCIUM 9.5 11/03/2011 0920   PROT 6.6 08/20/2015 1102   PROT 6.7 11/03/2011 0920   ALBUMIN 4.3 08/20/2015 1102   ALBUMIN 4.3 11/03/2011 0920   AST 30 08/20/2015 1102   ALT 30 08/20/2015 1102   ALKPHOS 112 08/20/2015 1102   BILITOT  0.3 08/20/2015 1102   BILITOT 0.5 11/03/2011 0920   GFRNONAA 99.95 12/05/2009 0804   Lab Results  Component Value Date   CHOL 203* 11/03/2011   HDL 44.60 11/03/2011   LDLCALC 136* 12/05/2009    LDLDIRECT 130.2 11/03/2011   TRIG 185.0* 11/03/2011   CHOLHDL 5 11/03/2011   No results found for: HGBA1C No results found for: VITAMINB12 Lab Results  Component Value Date   TSH 3.80 11/03/2011       ASSESSMENT AND PLAN  MULTIPLE SCLEROSIS, PROGRESSIVE/RELAPSING  DISPLCMT LUMBAR INTERVERT DISC W/O MYELOPATHY  Trochanteric bursitis of right hip  Chronic fatigue  Abnormality of gait  Adjustment disorder with mixed anxiety and depressed mood  Urinary frequency  Bilateral arm pain - Plan: NCV with EMG(electromyography)  Numbness - Plan: NCV with EMG(electromyography)  Multiple sclerosis ([place]) - Plan: Hepatic function panel  High risk medication use - Plan: Hepatic function panel    1.   Continue Aubagio 2.   Check hepatic panel   3.   Check NCV/EMG bilateral arms for possible CTS and ulnar neuropathy ... If normal, considr MRI cervica 4.    Phentermine 15 mg for weight loss and fatigue Return to see me in about 4 or 5 months but is advised to call sooner if she has new or worsening neurologic symptoms.  40 minutes face-to-face evaluation with greater than one half of the time counseling correlating care about her multiple sclerosis and her new symptoms   Ami Thornsberry A. Felecia Shelling, MD, PhD [id number], [id number] PM Certified in Neurology, Clinical Neurophysiology, Sleep Medicine, Pain Medicine and Neuroimaging  [facility] [address], [place], [place] [address] [phone]-

## 2015-10-11 ENCOUNTER — Telehealth: Payer: Self-pay | Admitting: Neurology

## 2015-10-11 LAB — HEPATIC FUNCTION PANEL
ALT: 23 IU/L (ref 0–32)
AST: 18 IU/L (ref 0–40)
Albumin: 4.5 g/dL (ref 3.6–4.8)
Alkaline Phosphatase: 118 IU/L — ABNORMAL HIGH (ref 39–117)
BILIRUBIN, DIRECT: 0.09 mg/dL (ref 0.00–0.40)
Bilirubin Total: 0.3 mg/dL (ref 0.0–1.2)
TOTAL PROTEIN: 6.6 g/dL (ref 6.0–8.5)

## 2015-10-11 NOTE — Telephone Encounter (Signed)
Pt is requesting a refill on ciprofloxacin (CIPRO) 500 MG tablet. Thank you

## 2015-10-11 NOTE — Telephone Encounter (Signed)
I have spoken with Sanaz and advised that [name] sent Cipro in during ov yesterday/fim

## 2015-10-12 ENCOUNTER — Ambulatory Visit: Payer: Self-pay | Admitting: Neurology

## 2015-10-15 ENCOUNTER — Telehealth: Payer: Self-pay | Admitting: *Deleted

## 2015-10-15 NOTE — Telephone Encounter (Signed)
I spoke with Dawn [name] last week and per [name], advised that lft's were normal.  She verbalized understanding of same/fim

## 2015-10-15 NOTE — Telephone Encounter (Signed)
-----   Message from Britt Bottom, MD sent at 10/11/2015  8:20 AM EDT ----- Liver tests are fine..   continue [place]

## 2015-10-29 ENCOUNTER — Telehealth: Payer: Self-pay | Admitting: Neurology

## 2015-10-29 NOTE — Telephone Encounter (Signed)
Patient called to advise, hasn't had anymore problems in hands. Not sure if she still needs NCS/EMG appointment on Friday 11/02/15. Please call to advise.

## 2015-10-29 NOTE — Telephone Encounter (Signed)
LMTC./fim 

## 2015-10-29 NOTE — Telephone Encounter (Signed)
I have spoken with Dawn [name] this afternoon and per [name], advised if pain in hands/wrists has resolved, it was prob r/t MS, and she can cancel the ncv/emg.  She verbalized understanding of same, sts. is having more lbp and requests appt. for tpi.  Appt. given for tomorrow at 1440/fim

## 2015-10-30 ENCOUNTER — Encounter: Payer: Self-pay | Admitting: Neurology

## 2015-10-30 ENCOUNTER — Ambulatory Visit (INDEPENDENT_AMBULATORY_CARE_PROVIDER_SITE_OTHER): Payer: Medicare Other | Admitting: Neurology

## 2015-10-30 VITALS — BP 146/78 | HR 66 | Resp 16 | Ht 62.0 in | Wt 185.0 lb

## 2015-10-30 DIAGNOSIS — R269 Unspecified abnormalities of gait and mobility: Secondary | ICD-10-CM | POA: Diagnosis not present

## 2015-10-30 DIAGNOSIS — M5431 Sciatica, right side: Secondary | ICD-10-CM | POA: Diagnosis not present

## 2015-10-30 DIAGNOSIS — G35 Multiple sclerosis: Secondary | ICD-10-CM | POA: Diagnosis not present

## 2015-10-30 DIAGNOSIS — R5382 Chronic fatigue, unspecified: Secondary | ICD-10-CM | POA: Diagnosis not present

## 2015-10-30 DIAGNOSIS — M7061 Trochanteric bursitis, right hip: Secondary | ICD-10-CM

## 2015-10-30 NOTE — Progress Notes (Signed)
[facility]  PATIENT: Dawn [name] DOB: [birth year]  REFERRING CLINICIAN: Aura Dials HISTORY FROM: Patient REASON FOR VISIT: MS   HISTORICAL  CHIEF COMPLAINT:  Chief Complaint  Patient presents with  . Multiple Sclerosis    Sts. right inguinal, right lower back/hip pain related to torn labrum is worse.  She is requesting a tpi today/fim    HISTORY OF PRESENT ILLNESS:  Dawn [name] is a 70 year old woman who was diagnosed with multiple sclerosis in 2001.    Currently, she is experiencing more pain in the right hip that goes into the groin and down the proximal leg. In the past, she has had an injection into the right trochanteric bursa and pain was much better for long period of time.  MS:   She is on Aubagio and she tolerates it well.  The first week she had HA and diarrhea but no longer has either symptom.   She could not tolerate Tecfidera and had tolerability issues with Plegridy and Betaseron earlier.  Right hip pain/LBP:    She is experiencing more pain in the right hip that goes into the groin and down the proximal leg.  The right hip pain improved after injection into the trochanteric bursa.   She also is experiencing pain in the buttock, mostly on the right. Orthopedics has seen her in the past and she was told that there was a labrum tear in the hip.  Hand pain:   She notes hand pain and numbness improved and she cancelled the NCV/EMG.       Gait/strength/sensation:     She has mildly wide gait but does not need to use a cane for short distances.   However, she uses a Rollator for longer distances.   .   She also reports bilateral left > right leg dysesthesia.    He has had dysesthesias in the leg and feet for many years. She stopped gabapentin as it had not helped.   Sometimes she gets sharp pains superimposed on the more constant dysesthesias.   She denies much weakness but has muscle stiffness and pain.  .  Bladder:   She has urinary frequency  .   Hesitancy is better on tamsulosin qod.        Vesicare helped frequency better than oxybtynin.   She has had several UTI's in 2016.    Vision   She reports that her vision is doing well.   No diplopia or blurriness.   No eye pain.     Fatigue/sleep:   She has fatigue that increases as a day goes on. This is both physical and cognitive.   Provigil helps but may affect sleep. Phentermine 37.5 kept her awake at night, so she stopped but a lower dose is well tolerated.    She has some difficulty with sleep onset insomnia > sleep maintenance.   However, due to nocturia, she is waking up more.   Mood/cognition:    She had mild depression and does not wish to try another agent   She gets apathetic, especially when she is more tired. Cognitively, she has difficulty with short-term memory as well as with verbal fluency   MS History:    In 1994, she had an episode of severe vertigo with gait ataxia. An MRI at that time was reportedly normal. The symptoms were felt to be due to allergies. About 7 years later she had vertigo, gait ataxia and diplopia. Also her left leg was giving out. Short  after that she had a hysterectomy. Postoperatively, she was numb in both legs and she had a lumbar puncture which showed changes consistent with MS. Then, an MRI of the brain was performed showing changes consistent with MS. She was referred to Dr. Erling Cruz who her on Betaseron. Last year, she switched from Betaseron to Tecfidera, in the hope that she would be able to avoid shots as she was having severe skin reactions with knots.    However, she had a lot of difficulty tolerating Tecfidera and swithced to [place] in early 2016 and to Aubagio late 2016    REVIEW OF SYSTEMS:  Constitutional: No fevers, chills, sweats, or change in appetite.   Reports fatigue Eyes: No visual changes, double vision, eye pain Ear, nose and throat: No hearing loss, ear pain, nasal congestion, sore throat Cardiovascular: No chest pain,  palpitations Respiratory:  No shortness of breath at rest or with exertion.   No wheezes GastrointestinaI: No nausea, vomiting, diarrhea, abdominal pain, fecal incontinence Genitourinary:  see above. Musculoskeletal:  Pain in right hip (now milder), and lower back Integumentary: No rash, pruritus.   She gets skin reactions with Plegridy Neurological: as above Psychiatric: No depression at this time.  No anxiety Endocrine: No palpitations, diaphoresis, change in appetite, change in weigh or increased thirst Hematologic/Lymphatic:  No anemia, purpura, petechiae. Allergic/Immunologic: No itchy/runny eyes, nasal congestion, recent allergic reactions, rashes  ALLERGIES: No Known Allergies  HOME MEDICATIONS: Outpatient Prescriptions Prior to Visit  Medication Sig Dispense Refill  . aspirin 81 MG tablet Take 81 mg by mouth daily.    . calcium carbonate (OS-CAL) 600 MG TABS Take 600 mg by mouth daily.    . cholecalciferol (VITAMIN D) 1000 UNITS tablet Take 1,000 Units by mouth daily.     . ciprofloxacin (CIPRO) 500 MG tablet Take 1 tablet (500 mg total) by mouth 2 (two) times daily. 14 tablet 1  . clonazePAM (KLONOPIN) 0.5 MG tablet Take 1 tablet (0.5 mg total) by mouth at bedtime. 30 tablet 5  . docusate sodium (COLACE) 100 MG capsule Take 200 mg by mouth at bedtime. Reported on 08/20/2015    . fexofenadine (ALLEGRA) 180 MG tablet Take 180 mg by mouth daily.    . fluticasone (VERAMYST) 27.5 MCG/SPRAY nasal spray Place 2 sprays into the nose daily as needed.    [name] Kitchen MIMVEY LO 0.5-0.1 MG per tablet Take 1 tablet by mouth daily.  12  . modafinil (PROVIGIL) 200 MG tablet Take 1 tablet (200 mg total) by mouth daily as needed. 30 tablet 5  . Multiple Vitamins-Minerals (MULTIVITAMIN WITH MINERALS) tablet Take 1 tablet by mouth daily.    [name] Kitchen omeprazole (PRILOSEC) 40 MG capsule     . oxybutynin (DITROPAN) 5 MG tablet Take 1 tablet (5 mg total) by mouth 2 (two) times daily. 60 tablet 3  . phentermine 15 MG  capsule Take 1 capsule (15 mg total) by mouth every morning. 30 capsule 5  . solifenacin (VESICARE) 5 MG tablet Take 1 tablet (5 mg total) by mouth daily. 30 tablet 5  . tamsulosin (FLOMAX) 0.4 MG CAPS capsule Take 1 capsule (0.4 mg total) by mouth daily. 30 capsule 11  . Teriflunomide 14 MG TABS Take 14 mg by mouth daily.    . progesterone (PROMETRIUM) 100 MG capsule TAKE 1 CAPSULE BY MOUTH AT BEDTIME 90 capsule 3  . escitalopram (LEXAPRO) 10 MG tablet Take 1 tablet (10 mg total) by mouth daily. (Patient not taking: Reported on 08/20/2015) 30 tablet 11  No facility-administered medications prior to visit.    PAST MEDICAL HISTORY: Past Medical History  Diagnosis Date  . Allergy   . Multiple sclerosis ([place])   . Hot flashes, menopausal 10/13/2011    Estradiol started   . Neuropathy ([place])   . Vision abnormalities     PAST SURGICAL HISTORY: Past Surgical History  Procedure Laterality Date  . Lumbar fusion    . Abdominal hysterectomy  2002    partial    FAMILY HISTORY: Family History  Problem Relation Age of Onset  . Arthritis    . Hypertension    . Heart failure Mother   . Heart failure Father     SOCIAL HISTORY:  Social History   Social History  . Marital Status: Widowed    Spouse Name: N/A  . Number of Children: N/A  . Years of Education: N/A   Occupational History  . retired    Social History Main Topics  . Smoking status: Former Smoker    Quit date: 08/05/1983  . Smokeless tobacco: Not on file  . Alcohol Use: 0.0 oz/week    0 Standard drinks or equivalent per week     Comment: rarely  . Drug Use: No  . Sexual Activity: Not Currently    Birth Control/ Protection: Post-menopausal   Other Topics Concern  . Not on file   Social History Narrative   Regular Exercise-no   Widowed   Retired              PHYSICAL EXAM  Filed Vitals:   10/30/15 1451  BP: 146/78  Pulse: 66  Resp: 16  Height: 5\' 2"  (1.575 m)  Weight: 185 lb (83.915 kg)    Body  mass index is 33.83 kg/(m^2).   General: The patient is well-developed and well-nourished and in no acute distress  Musculoskeletal:   She is very tender over the right pterygoid bursa and moderately tender over the right piriformis muscle. She has a positive Faber sign on the right. I'll be tender over the left trochanteric bursa. There is no Faber sign at the left hip.  Neurologic Exam  Mental status: The patient is alert and oriented x 3 at the time of the examination. The patient has apparent normal recent and remote memory, with an apparently normal attention span and concentration ability.   Speech is normal.  Cranial nerves: Extraocular movements are full.    Facial symmetry is present. There is good facial sensation to soft touch bilaterally.Facial strength is normal.  Trapezius and sternocleidomastoid strength is normal. No dysarthria is noted.  The tongue is midline, and the patient has symmetric elevation of the soft palate. No obvious hearing deficits are noted.  Motor:  Muscle bulk is normal but tone is symmetric, more on the left.. Strength is 4+/5 in intrinsic hand muscles   Sensory: Sensory testing is intact to  touch, vibration sensation, in the arms and she reports reduced left leg sensation to vibration.  Coordination: Cerebellar testing reveals reduced right finger-nose-finger and reduced heel-to-shin, left worse than right  Gait and station: Station is normal and gait is mildly wide. Tandem gait is very wide. Romberg is negative.   Reflexes: Deep tendon reflexes are symmetric and normal bilaterally.      DIAGNOSTIC DATA (LABS, IMAGING, TESTING) - I reviewed patient records, labs, notes, testing and imaging myself where available.  Lab Results  Component Value Date   WBC CANCELED 08/20/2015   HGB 14.2 11/03/2011   HCT CANCELED 08/20/2015  MCV 85 08/20/2015   PLT CANCELED 08/20/2015      Component Value Date/Time   NA 141 11/03/2011 0920   K 4.6 11/03/2011  0920   CL 106 11/03/2011 0920   CO2 26 11/03/2011 0920   GLUCOSE 82 11/03/2011 0920   BUN 16 11/03/2011 0920   CREATININE 0.8 11/03/2011 0920   CALCIUM 9.5 11/03/2011 0920   PROT 6.6 10/10/2015 1550   PROT 6.7 11/03/2011 0920   ALBUMIN 4.5 10/10/2015 1550   ALBUMIN 4.3 11/03/2011 0920   AST 18 10/10/2015 1550   ALT 23 10/10/2015 1550   ALKPHOS 118* 10/10/2015 1550   BILITOT 0.3 10/10/2015 1550   BILITOT 0.5 11/03/2011 0920   GFRNONAA 99.95 12/05/2009 0804   Lab Results  Component Value Date   CHOL 203* 11/03/2011   HDL 44.60 11/03/2011   LDLCALC 136* 12/05/2009   LDLDIRECT 130.2 11/03/2011   TRIG 185.0* 11/03/2011   CHOLHDL 5 11/03/2011   No results found for: HGBA1C No results found for: VITAMINB12 Lab Results  Component Value Date   TSH 3.80 11/03/2011       ASSESSMENT AND PLAN  MULTIPLE SCLEROSIS, PROGRESSIVE/RELAPSING  Abnormality of gait  Chronic fatigue  Trochanteric bursitis of right hip  Sciatica, right side     1.   Continue Aubagio 2.   Right trochanteric bursa injection with 40 mg Depo-Medrol in 3 mL Marcaine using sterile technique. She tolerated the procedure well and pain was much better within a few minutes. 3.   Right piriformis muscle injection with 40 mg Depo-Medrol in 3 mL Marcaine using sterile technique. She tolerated the procedure well. 4.    Phentermine 15 mg for weight loss and fatigue Return to see me in about 3-4 months but is advised to call sooner if she has new or worsening neurologic symptoms.   Richard A. Felecia Shelling, MD, PhD [id number], [id number] PM Certified in Neurology, Clinical Neurophysiology, Sleep Medicine, Pain Medicine and Neuroimaging  [facility] [address] [place], [place] [address] [phone]-

## 2015-11-02 ENCOUNTER — Encounter: Payer: No Typology Code available for payment source | Admitting: Neurology

## 2015-11-05 ENCOUNTER — Encounter: Payer: Self-pay | Admitting: Neurology

## 2015-11-14 ENCOUNTER — Other Ambulatory Visit: Payer: Self-pay | Admitting: *Deleted

## 2015-11-14 ENCOUNTER — Other Ambulatory Visit (INDEPENDENT_AMBULATORY_CARE_PROVIDER_SITE_OTHER): Payer: Self-pay

## 2015-11-14 DIAGNOSIS — G35 Multiple sclerosis: Secondary | ICD-10-CM

## 2015-11-14 DIAGNOSIS — Z79899 Other long term (current) drug therapy: Secondary | ICD-10-CM | POA: Diagnosis not present

## 2015-11-14 DIAGNOSIS — Z0289 Encounter for other administrative examinations: Secondary | ICD-10-CM

## 2015-11-15 LAB — HEPATIC FUNCTION PANEL
ALK PHOS: 123 IU/L — AB (ref 39–117)
ALT: 20 IU/L (ref 0–32)
AST: 15 IU/L (ref 0–40)
Albumin: 4.5 g/dL (ref 3.6–4.8)
Bilirubin Total: 0.2 mg/dL (ref 0.0–1.2)
Bilirubin, Direct: 0.08 mg/dL (ref 0.00–0.40)
TOTAL PROTEIN: 6.5 g/dL (ref 6.0–8.5)

## 2015-11-16 ENCOUNTER — Telehealth: Payer: Self-pay | Admitting: *Deleted

## 2015-11-16 NOTE — Telephone Encounter (Signed)
-----   Message from Britt Bottom, MD sent at 11/15/2015  5:22 PM EDT ----- LFT are ok

## 2015-11-16 NOTE — Telephone Encounter (Signed)
I have spoken with Dawn [name] this morning and per [name], advised that lft's are ok.  She verbalized understanding of same/fim

## 2015-11-23 DIAGNOSIS — Z0000 Encounter for general adult medical examination without abnormal findings: Secondary | ICD-10-CM | POA: Diagnosis not present

## 2015-11-23 DIAGNOSIS — G35 Multiple sclerosis: Secondary | ICD-10-CM | POA: Diagnosis not present

## 2015-11-28 DIAGNOSIS — Z0000 Encounter for general adult medical examination without abnormal findings: Secondary | ICD-10-CM | POA: Diagnosis not present

## 2015-11-28 DIAGNOSIS — E559 Vitamin D deficiency, unspecified: Secondary | ICD-10-CM | POA: Diagnosis not present

## 2015-11-28 DIAGNOSIS — G4762 Sleep related leg cramps: Secondary | ICD-10-CM | POA: Diagnosis not present

## 2015-11-28 DIAGNOSIS — G35 Multiple sclerosis: Secondary | ICD-10-CM | POA: Diagnosis not present

## 2015-11-28 DIAGNOSIS — Z79899 Other long term (current) drug therapy: Secondary | ICD-10-CM | POA: Diagnosis not present

## 2015-12-12 DIAGNOSIS — M5442 Lumbago with sciatica, left side: Secondary | ICD-10-CM | POA: Diagnosis not present

## 2015-12-18 ENCOUNTER — Other Ambulatory Visit (INDEPENDENT_AMBULATORY_CARE_PROVIDER_SITE_OTHER): Payer: Self-pay

## 2015-12-18 ENCOUNTER — Other Ambulatory Visit: Payer: Self-pay | Admitting: *Deleted

## 2015-12-18 DIAGNOSIS — Z79899 Other long term (current) drug therapy: Secondary | ICD-10-CM

## 2015-12-18 DIAGNOSIS — Z0289 Encounter for other administrative examinations: Secondary | ICD-10-CM

## 2015-12-18 DIAGNOSIS — G35 Multiple sclerosis: Secondary | ICD-10-CM

## 2015-12-19 ENCOUNTER — Telehealth: Payer: Self-pay | Admitting: *Deleted

## 2015-12-19 LAB — HEPATIC FUNCTION PANEL
ALK PHOS: 104 IU/L (ref 39–117)
ALT: 26 IU/L (ref 0–32)
AST: 16 IU/L (ref 0–40)
Albumin: 4.2 g/dL (ref 3.6–4.8)
Bilirubin Total: 0.3 mg/dL (ref 0.0–1.2)
Bilirubin, Direct: 0.1 mg/dL (ref 0.00–0.40)
Total Protein: 6.1 g/dL (ref 6.0–8.5)

## 2015-12-19 NOTE — Telephone Encounter (Signed)
LMOM that per [name], lft's look good; she should continue meds as rx'd.  She does not need to return this call unless she has questions/fim

## 2015-12-19 NOTE — Telephone Encounter (Signed)
-----   Message from Britt Bottom, MD sent at 12/19/2015  8:29 AM EDT ----- Please let her know that the liver function tests look good.

## 2016-01-04 DIAGNOSIS — M25551 Pain in right hip: Secondary | ICD-10-CM | POA: Diagnosis not present

## 2016-01-04 DIAGNOSIS — M25651 Stiffness of right hip, not elsewhere classified: Secondary | ICD-10-CM | POA: Diagnosis not present

## 2016-01-04 DIAGNOSIS — M791 Myalgia: Secondary | ICD-10-CM | POA: Diagnosis not present

## 2016-01-04 DIAGNOSIS — M545 Low back pain: Secondary | ICD-10-CM | POA: Diagnosis not present

## 2016-01-09 DIAGNOSIS — M25651 Stiffness of right hip, not elsewhere classified: Secondary | ICD-10-CM | POA: Diagnosis not present

## 2016-01-09 DIAGNOSIS — M545 Low back pain: Secondary | ICD-10-CM | POA: Diagnosis not present

## 2016-01-09 DIAGNOSIS — M791 Myalgia: Secondary | ICD-10-CM | POA: Diagnosis not present

## 2016-01-09 DIAGNOSIS — M25551 Pain in right hip: Secondary | ICD-10-CM | POA: Diagnosis not present

## 2016-01-11 DIAGNOSIS — M25651 Stiffness of right hip, not elsewhere classified: Secondary | ICD-10-CM | POA: Diagnosis not present

## 2016-01-11 DIAGNOSIS — M25551 Pain in right hip: Secondary | ICD-10-CM | POA: Diagnosis not present

## 2016-01-11 DIAGNOSIS — M545 Low back pain: Secondary | ICD-10-CM | POA: Diagnosis not present

## 2016-01-11 DIAGNOSIS — M791 Myalgia: Secondary | ICD-10-CM | POA: Diagnosis not present

## 2016-01-14 ENCOUNTER — Other Ambulatory Visit: Payer: Self-pay | Admitting: Neurology

## 2016-01-14 ENCOUNTER — Other Ambulatory Visit (INDEPENDENT_AMBULATORY_CARE_PROVIDER_SITE_OTHER): Payer: Self-pay

## 2016-01-14 DIAGNOSIS — G35 Multiple sclerosis: Secondary | ICD-10-CM

## 2016-01-14 DIAGNOSIS — M791 Myalgia: Secondary | ICD-10-CM | POA: Diagnosis not present

## 2016-01-14 DIAGNOSIS — M545 Low back pain: Secondary | ICD-10-CM | POA: Diagnosis not present

## 2016-01-14 DIAGNOSIS — M25551 Pain in right hip: Secondary | ICD-10-CM | POA: Diagnosis not present

## 2016-01-14 DIAGNOSIS — Z0289 Encounter for other administrative examinations: Secondary | ICD-10-CM

## 2016-01-14 DIAGNOSIS — M25651 Stiffness of right hip, not elsewhere classified: Secondary | ICD-10-CM | POA: Diagnosis not present

## 2016-01-15 ENCOUNTER — Telehealth: Payer: Self-pay | Admitting: *Deleted

## 2016-01-15 LAB — HEPATIC FUNCTION PANEL
ALK PHOS: 129 IU/L — AB (ref 39–117)
ALT: 18 IU/L (ref 0–32)
AST: 19 IU/L (ref 0–40)
Albumin: 3.9 g/dL (ref 3.6–4.8)
Bilirubin Total: 0.3 mg/dL (ref 0.0–1.2)
Bilirubin, Direct: 0.08 mg/dL (ref 0.00–0.40)
Total Protein: 5.9 g/dL — ABNORMAL LOW (ref 6.0–8.5)

## 2016-01-15 NOTE — Telephone Encounter (Signed)
#   busy/fim 

## 2016-01-15 NOTE — Telephone Encounter (Signed)
-----   Message from Britt Bottom, MD sent at 01/15/2016  9:52 AM EDT ----- Liver labs are ok

## 2016-01-16 DIAGNOSIS — D239 Other benign neoplasm of skin, unspecified: Secondary | ICD-10-CM | POA: Diagnosis not present

## 2016-01-16 DIAGNOSIS — L821 Other seborrheic keratosis: Secondary | ICD-10-CM | POA: Diagnosis not present

## 2016-01-16 DIAGNOSIS — L728 Other follicular cysts of the skin and subcutaneous tissue: Secondary | ICD-10-CM | POA: Diagnosis not present

## 2016-01-17 DIAGNOSIS — M25651 Stiffness of right hip, not elsewhere classified: Secondary | ICD-10-CM | POA: Diagnosis not present

## 2016-01-17 DIAGNOSIS — M545 Low back pain: Secondary | ICD-10-CM | POA: Diagnosis not present

## 2016-01-17 DIAGNOSIS — M791 Myalgia: Secondary | ICD-10-CM | POA: Diagnosis not present

## 2016-01-17 DIAGNOSIS — M25551 Pain in right hip: Secondary | ICD-10-CM | POA: Diagnosis not present

## 2016-01-18 ENCOUNTER — Telehealth: Payer: Self-pay | Admitting: *Deleted

## 2016-01-18 NOTE — Telephone Encounter (Signed)
-----   Message from Britt Bottom, MD sent at 01/15/2016  9:52 AM EDT ----- Liver labs are ok

## 2016-01-18 NOTE — Telephone Encounter (Signed)
I have spoken with Lolly this afternoon, and per [name], advised that lft's are ok.  Just one more set needs to be done now.  She verbalized understanding of same/fim

## 2016-01-25 DIAGNOSIS — M25551 Pain in right hip: Secondary | ICD-10-CM | POA: Diagnosis not present

## 2016-01-25 DIAGNOSIS — M545 Low back pain: Secondary | ICD-10-CM | POA: Diagnosis not present

## 2016-01-25 DIAGNOSIS — M791 Myalgia: Secondary | ICD-10-CM | POA: Diagnosis not present

## 2016-01-25 DIAGNOSIS — M25651 Stiffness of right hip, not elsewhere classified: Secondary | ICD-10-CM | POA: Diagnosis not present

## 2016-02-11 ENCOUNTER — Other Ambulatory Visit: Payer: Self-pay | Admitting: Neurology

## 2016-02-13 DIAGNOSIS — L03116 Cellulitis of left lower limb: Secondary | ICD-10-CM | POA: Diagnosis not present

## 2016-02-13 DIAGNOSIS — J069 Acute upper respiratory infection, unspecified: Secondary | ICD-10-CM | POA: Diagnosis not present

## 2016-02-18 ENCOUNTER — Encounter: Payer: Self-pay | Admitting: Neurology

## 2016-02-18 ENCOUNTER — Ambulatory Visit (INDEPENDENT_AMBULATORY_CARE_PROVIDER_SITE_OTHER): Payer: Medicare Other | Admitting: Neurology

## 2016-02-18 VITALS — BP 133/80 | HR 74 | Ht 62.0 in | Wt 179.5 lb

## 2016-02-18 DIAGNOSIS — R269 Unspecified abnormalities of gait and mobility: Secondary | ICD-10-CM | POA: Diagnosis not present

## 2016-02-18 DIAGNOSIS — R350 Frequency of micturition: Secondary | ICD-10-CM

## 2016-02-18 DIAGNOSIS — M7061 Trochanteric bursitis, right hip: Secondary | ICD-10-CM

## 2016-02-18 DIAGNOSIS — G35 Multiple sclerosis: Secondary | ICD-10-CM

## 2016-02-18 DIAGNOSIS — M5431 Sciatica, right side: Secondary | ICD-10-CM

## 2016-02-18 DIAGNOSIS — M7062 Trochanteric bursitis, left hip: Secondary | ICD-10-CM | POA: Insufficient documentation

## 2016-02-18 DIAGNOSIS — R5382 Chronic fatigue, unspecified: Secondary | ICD-10-CM

## 2016-02-18 MED ORDER — CLONAZEPAM 0.5 MG PO TABS: 0.5000 mg | ORAL_TABLET | Freq: Every day | ORAL | 5 refills | Status: DC

## 2016-02-18 MED ORDER — PHENTERMINE HCL 15 MG PO CAPS: 15.0000 mg | ORAL_CAPSULE | ORAL | 5 refills | Status: DC

## 2016-02-18 MED ORDER — TRAMADOL HCL 50 MG PO TABS: 50.0000 mg | ORAL_TABLET | Freq: Three times a day (TID) | ORAL | 2 refills | Status: DC | PRN

## 2016-02-18 NOTE — Progress Notes (Signed)
[facility]  PATIENT: Dawn [name] DOB: 02-13-1946  REFERRING CLINICIAN: Aura Dials HISTORY FROM: Patient REASON FOR VISIT: MS   HISTORICAL  CHIEF COMPLAINT:  Chief Complaint  Patient presents with  . Multiple Sclerosis    She has noticed the following intermittent symptoms: tingling in her bilateral hands, blurred vision and heart palpitations.    HISTORY OF PRESENT ILLNESS:  Dawn [name] is a 70 year old woman who was diagnosed with multiple sclerosis in 2001.   She is noting more hand numbness and has had episodes of visual blurring.    MS:   She is on Aubagio and she tolerates it well.  The first week she had HA and diarrhea but no longer has either symptom.   She could not tolerate Tecfidera and had tolerability issues with Plegridy and Betaseron earlier.  Hand numbness:   She notes hand numbness bilaterally that is intermittent.   In the past,m she had left hand pain but current symptoms are not painful.     She had cancelled an EMG earlier this year.  Vision:   She notes mild blurred vision that is intermittent.    She feels both eyes are involved.   Once occurred while driving and once while watching TV.    She did not feel symptoms were just from one eye.     Hip pain/LBP:    She went to PT and notes mild improvement butpain has mostly returned since shots 3 - 4 months ago.   The right hip pain improved after injection into the trochanteric bursa more the first time than second time.   Piriformis injections wear off after 2-3 months.     Orthopedics has seen her in the past and she was told that there was a labrum tear in the hip.   Tramadol prn helps  Gait/strength/sensation:     She has mildly wide gait but does not need to use a cane for short distances.   However, she uses a Rollator for longer distances.   .   She has bilateral left leg dysesthesia with throbbing, helped by ice.   She stopped gabapentin as it had not helped.   Sometimes she  gets sharp pains superimposed on the more constant dysesthesias.   She denies much weakness but has muscle stiffness and pain.  .  Bladder:   She has urinary frequency .   Hesitancy is better on tamsulosin qod.        Vesicare helps.   She has had several UTI's in 2016.     Fatigue/sleep:   She has fatigue that increases as a day goes on. This is both physical and cognitive.   Provigil helps but may affect sleep. Phentermine 15 mg is better tolerated than 37.5 mg and helps fatigue    She has some difficulty with sleep onset insomnia > sleep maintenance.   Vesicare has helped sleep with less nocturia.   Mood/cognition:    She had mild depression but does not wish to try another agent   She notes some apathy but less than earlier this year.   Cognitively, she has difficulty with short-term memory as well as with verbal fluency  MS History:    In 1994, she had an episode of severe vertigo with gait ataxia. An MRI at that time was reportedly normal. The symptoms were felt to be due to allergies. About 7 years later she had vertigo, gait ataxia and diplopia. Also her left leg was giving out.  Short after that she had a hysterectomy. Postoperatively, she was numb in both legs and she had a lumbar puncture which showed changes consistent with MS. Then, an MRI of the brain was performed showing changes consistent with MS. She was referred to Dr. Erling Cruz who her on Betaseron. Last year, she switched from Betaseron to Tecfidera, in the hope that she would be able to avoid shots as she was having severe skin reactions with knots.    However, she had a lot of difficulty tolerating Tecfidera and swithced to [place] in early 2016 and to Aubagio late 2016    REVIEW OF SYSTEMS:  Constitutional: No fevers, chills, sweats, or change in appetite.   Reports fatigue Eyes: No visual changes, double vision, eye pain Ear, nose and throat: No hearing loss, ear pain, nasal congestion, sore throat Cardiovascular: No chest pain,  palpitations Respiratory:  No shortness of breath at rest or with exertion.   No wheezes GastrointestinaI: No nausea, vomiting, diarrhea, abdominal pain, fecal incontinence Genitourinary:  see above. Musculoskeletal:  Pain in right > left hip, and lower back/piriformis Integumentary: No rash, pruritus.    Neurological: as above Psychiatric: No depression at this time.  No anxiety Endocrine: No palpitations, diaphoresis, change in appetite, change in weigh or increased thirst Hematologic/Lymphatic:  No anemia, purpura, petechiae. Allergic/Immunologic: No itchy/runny eyes, nasal congestion, recent allergic reactions, rashes  ALLERGIES: No Known Allergies  HOME MEDICATIONS: Outpatient Medications Prior to Visit  Medication Sig Dispense Refill  . aspirin 81 MG tablet Take 81 mg by mouth daily.    . calcium carbonate (OS-CAL) 600 MG TABS Take 600 mg by mouth daily.    . cholecalciferol (VITAMIN D) 1000 UNITS tablet Take 1,000 Units by mouth daily.     [name] Kitchen docusate sodium (COLACE) 100 MG capsule Take 200 mg by mouth at bedtime. Reported on 08/20/2015    . fexofenadine (ALLEGRA) 180 MG tablet Take 180 mg by mouth daily.    [name] Kitchen MIMVEY LO 0.5-0.1 MG per tablet Take 1 tablet by mouth daily.  12  . Multiple Vitamins-Minerals (MULTIVITAMIN WITH MINERALS) tablet Take 1 tablet by mouth daily.    [name] Kitchen omeprazole (PRILOSEC) 40 MG capsule     . Teriflunomide 14 MG TABS Take 14 mg by mouth daily.    . VESICARE 5 MG tablet TAKE 1 TABLET (5 MG TOTAL) BY MOUTH DAILY. 30 tablet 11  . ciprofloxacin (CIPRO) 500 MG tablet Take 1 tablet (500 mg total) by mouth 2 (two) times daily. 14 tablet 1  . clonazePAM (KLONOPIN) 0.5 MG tablet Take 1 tablet (0.5 mg total) by mouth at bedtime. 30 tablet 5  . fluticasone (VERAMYST) 27.5 MCG/SPRAY nasal spray Place 2 sprays into the nose daily as needed.    . modafinil (PROVIGIL) 200 MG tablet Take 1 tablet (200 mg total) by mouth daily as needed. 30 tablet 5  . oxybutynin (DITROPAN)  5 MG tablet Take 1 tablet (5 mg total) by mouth 2 (two) times daily. 60 tablet 3  . phentermine 15 MG capsule Take 1 capsule (15 mg total) by mouth every morning. 30 capsule 5  . tamsulosin (FLOMAX) 0.4 MG CAPS capsule Take 1 capsule (0.4 mg total) by mouth daily. 30 capsule 11   No facility-administered medications prior to visit.     PAST MEDICAL HISTORY: Past Medical History:  Diagnosis Date  . Allergy   . Hot flashes, menopausal 10/13/2011   Estradiol started   . Multiple sclerosis ([place])   . Neuropathy ([place])   .  Vision abnormalities     PAST SURGICAL HISTORY: Past Surgical History:  Procedure Laterality Date  . ABDOMINAL HYSTERECTOMY  2002   partial  . LUMBAR FUSION      FAMILY HISTORY: Family History  Problem Relation Age of Onset  . Heart failure Mother   . Heart failure Father   . Arthritis    . Hypertension      SOCIAL HISTORY:  Social History   Social History  . Marital status: Widowed    Spouse name: N/A  . Number of children: N/A  . Years of education: N/A   Occupational History  . retired    Social History Main Topics  . Smoking status: Former Smoker    Quit date: 08/05/1983  . Smokeless tobacco: Not on file  . Alcohol use 0.0 oz/week     Comment: rarely  . Drug use: No  . Sexual activity: Not Currently    Birth control/ protection: Post-menopausal   Other Topics Concern  . Not on file   Social History Narrative   Regular Exercise-no   Widowed   Retired              PHYSICAL EXAM  Vitals:   02/18/16 1010  BP: 133/80  Pulse: 74  Weight: 179 lb 8 oz (81.4 kg)  Height: 5\' 2"  (1.575 m)    Body mass index is 32.83 kg/m.   General: The patient is well-developed and well-nourished and in no acute distress  Musculoskeletal:   She is very tender over the right > left trochanteric bursa and moderately tender over the right > left piriformis muscle.   Neurologic Exam  Mental status: The patient is alert and oriented x 3 at the  time of the examination. The patient has apparent normal recent and remote memory, with an apparently normal attention span and concentration ability.   Speech is normal.  Cranial nerves: Extraocular movements are full.    Facial symmetry is present. There is good facial sensation to soft touch bilaterally.Facial strength is normal.  Trapezius and sternocleidomastoid strength is normal. No dysarthria is noted.  The tongue is midline, and the patient has symmetric elevation of the soft palate. No obvious hearing deficits are noted.  Motor:  Muscle bulk is normal but tone is symmetric, more on the left.. Strength is 4+/5 in intrinsic hand muscles   Sensory: Sensory testing is intact to  touch, vibration sensation, in the arms and she reports reduced left leg sensation to vibration.  Coordination: Cerebellar testing reveals reduced right finger-nose-finger and reduced heel-to-shin, left worse than right  Gait and station: Station is normal and gait is mildly wide. Tandem gait is very wide. Romberg is negative.   Reflexes: Deep tendon reflexes are symmetric and normal bilaterally.      DIAGNOSTIC DATA (LABS, IMAGING, TESTING) - I reviewed patient records, labs, notes, testing and imaging myself where available.  Lab Results  Component Value Date   WBC CANCELED 08/20/2015   HGB 14.2 11/03/2011   HCT CANCELED 08/20/2015   MCV 85 08/20/2015   PLT CANCELED 08/20/2015      Component Value Date/Time   NA 141 11/03/2011 0920   K 4.6 11/03/2011 0920   CL 106 11/03/2011 0920   CO2 26 11/03/2011 0920   GLUCOSE 82 11/03/2011 0920   BUN 16 11/03/2011 0920   CREATININE 0.8 11/03/2011 0920   CALCIUM 9.5 11/03/2011 0920   PROT 5.9 (L) 01/14/2016 1116   ALBUMIN 3.9 01/14/2016 1116   AST  19 01/14/2016 1116   ALT 18 01/14/2016 1116   ALKPHOS 129 (H) 01/14/2016 1116   BILITOT 0.3 01/14/2016 1116   GFRNONAA 99.95 12/05/2009 0804   Lab Results  Component Value Date   CHOL 203 (H) 11/03/2011    HDL 44.60 11/03/2011   LDLCALC 136 (H) 12/05/2009   LDLDIRECT 130.2 11/03/2011   TRIG 185.0 (H) 11/03/2011   CHOLHDL 5 11/03/2011   No results found for: HGBA1C No results found for: VITAMINB12 Lab Results  Component Value Date   TSH 3.80 11/03/2011       ASSESSMENT AND PLAN  MULTIPLE SCLEROSIS, PROGRESSIVE/RELAPSING - Plan: CBC with Differential/Platelet, Comprehensive metabolic panel  Trochanteric bursitis of right hip  Chronic fatigue  Urinary frequency  Abnormality of gait  Sciatica, right side  Trochanteric bursitis of left hip    1.   Continue Aubagio, check labs to rule out hepatotoxicity or lymphopenia 2.    Bilateral piriformis muscle injection with 40 mg Depo-Medrol in 3 mL Marcaine using sterile technique. She tolerated the procedure well. 3.    Bilateral trochanteric bursa injections with a total of 40 mg Depo-Medrol in Marcaine using sterile technique. She tolerated the procedure well. 4.    Refill Phentermine 15 mg for weight loss and fatigue 5.    Refill tramadol and Klonopin. 6.   If she continues to have spells of visual changes, consider also checking a carotid ultrasound. I don't think it is MS related it does not have characteristics of amaurosis fugax.  Return to see me in about 4 months but is advised to call sooner if she has new or worsening neurologic symptoms.  45 minutes face-to-face with greater than one half of the time counseling and coordinating care about her MS and pain related issues.   Richard A. Felecia Shelling, MD, PhD [id number], [id number] PM Certified in Neurology, Clinical Neurophysiology, Sleep Medicine, Pain Medicine and Neuroimaging  [facility] [address] [place], [place] [address] [phone]-

## 2016-02-19 LAB — CBC WITH DIFFERENTIAL/PLATELET
BASOS ABS: 0.1 10*3/uL (ref 0.0–0.2)
Basos: 1 %
EOS (ABSOLUTE): 0.1 10*3/uL (ref 0.0–0.4)
Eos: 2 %
Hematocrit: 41.8 % (ref 34.0–46.6)
Hemoglobin: 13.8 g/dL (ref 11.1–15.9)
IMMATURE GRANS (ABS): 0 10*3/uL (ref 0.0–0.1)
IMMATURE GRANULOCYTES: 0 %
LYMPHS: 12 %
Lymphocytes Absolute: 0.6 10*3/uL — ABNORMAL LOW (ref 0.7–3.1)
MCH: 29.5 pg (ref 26.6–33.0)
MCHC: 33 g/dL (ref 31.5–35.7)
MCV: 89 fL (ref 79–97)
Monocytes Absolute: 0.4 10*3/uL (ref 0.1–0.9)
Monocytes: 8 %
NEUTROS ABS: 3.9 10*3/uL (ref 1.4–7.0)
NEUTROS PCT: 77 %
PLATELETS: 209 10*3/uL (ref 150–379)
RBC: 4.68 x10E6/uL (ref 3.77–5.28)
RDW: 15.3 % (ref 12.3–15.4)
WBC: 5 10*3/uL (ref 3.4–10.8)

## 2016-02-19 LAB — COMPREHENSIVE METABOLIC PANEL
ALT: 21 IU/L (ref 0–32)
AST: 22 IU/L (ref 0–40)
Albumin/Globulin Ratio: 2 (ref 1.2–2.2)
Albumin: 4.3 g/dL (ref 3.6–4.8)
Alkaline Phosphatase: 127 IU/L — ABNORMAL HIGH (ref 39–117)
BILIRUBIN TOTAL: 0.3 mg/dL (ref 0.0–1.2)
BUN/Creatinine Ratio: 23 (ref 12–28)
BUN: 20 mg/dL (ref 8–27)
CHLORIDE: 101 mmol/L (ref 96–106)
CO2: 24 mmol/L (ref 18–29)
Calcium: 9.8 mg/dL (ref 8.7–10.3)
Creatinine, Ser: 0.88 mg/dL (ref 0.57–1.00)
GFR calc non Af Amer: 68 mL/min/{1.73_m2} (ref >59)
GFR, EST AFRICAN AMERICAN: 78 mL/min/{1.73_m2} (ref >59)
GLUCOSE: 91 mg/dL (ref 65–99)
Globulin, Total: 2.1 g/dL (ref 1.5–4.5)
Potassium: 5 mmol/L (ref 3.5–5.2)
Sodium: 142 mmol/L (ref 134–144)
TOTAL PROTEIN: 6.4 g/dL (ref 6.0–8.5)

## 2016-04-15 DIAGNOSIS — Z23 Encounter for immunization: Secondary | ICD-10-CM | POA: Diagnosis not present

## 2016-05-23 DIAGNOSIS — Z01419 Encounter for gynecological examination (general) (routine) without abnormal findings: Secondary | ICD-10-CM | POA: Diagnosis not present

## 2016-05-23 DIAGNOSIS — Z124 Encounter for screening for malignant neoplasm of cervix: Secondary | ICD-10-CM | POA: Diagnosis not present

## 2016-06-24 ENCOUNTER — Telehealth: Payer: Self-pay | Admitting: *Deleted

## 2016-06-24 NOTE — Telephone Encounter (Signed)
error/fim. 

## 2016-06-25 ENCOUNTER — Encounter: Payer: Self-pay | Admitting: Neurology

## 2016-06-25 ENCOUNTER — Ambulatory Visit (INDEPENDENT_AMBULATORY_CARE_PROVIDER_SITE_OTHER): Payer: Medicare Other | Admitting: Neurology

## 2016-06-25 VITALS — BP 124/78 | HR 72 | Resp 16 | Ht 62.0 in | Wt 176.5 lb

## 2016-06-25 DIAGNOSIS — F4323 Adjustment disorder with mixed anxiety and depressed mood: Secondary | ICD-10-CM

## 2016-06-25 DIAGNOSIS — M7062 Trochanteric bursitis, left hip: Secondary | ICD-10-CM | POA: Diagnosis not present

## 2016-06-25 DIAGNOSIS — M7061 Trochanteric bursitis, right hip: Secondary | ICD-10-CM

## 2016-06-25 DIAGNOSIS — R5382 Chronic fatigue, unspecified: Secondary | ICD-10-CM | POA: Diagnosis not present

## 2016-06-25 DIAGNOSIS — R269 Unspecified abnormalities of gait and mobility: Secondary | ICD-10-CM

## 2016-06-25 DIAGNOSIS — G35 Multiple sclerosis: Secondary | ICD-10-CM | POA: Diagnosis not present

## 2016-06-25 DIAGNOSIS — R4189 Other symptoms and signs involving cognitive functions and awareness: Secondary | ICD-10-CM | POA: Insufficient documentation

## 2016-06-25 DIAGNOSIS — M5431 Sciatica, right side: Secondary | ICD-10-CM

## 2016-06-25 MED ORDER — MODAFINIL 200 MG PO TABS: 200.0000 mg | ORAL_TABLET | Freq: Every day | ORAL | 5 refills | Status: DC

## 2016-06-25 MED ORDER — CLONAZEPAM 0.5 MG PO TABS: 0.5000 mg | ORAL_TABLET | Freq: Every day | ORAL | 5 refills | Status: DC

## 2016-06-25 MED ORDER — ETODOLAC 400 MG PO TABS: 400.0000 mg | ORAL_TABLET | Freq: Two times a day (BID) | ORAL | 11 refills | Status: DC

## 2016-06-25 MED ORDER — SOLIFENACIN SUCCINATE 5 MG PO TABS: 5.0000 mg | ORAL_TABLET | Freq: Every day | ORAL | 11 refills | Status: DC

## 2016-06-25 NOTE — Progress Notes (Signed)
[facility]  PATIENT: Dawn [name] DOB: [birth year]  REFERRING CLINICIAN: Aura Dials HISTORY FROM: Patient REASON FOR VISIT: MS   HISTORICAL  CHIEF COMPLAINT:  Chief Complaint  Patient presents with  . Multiple Sclerosis    Sts. she continnues to tolerate Aubagio well. Sts. she is having more generalized joint pain, more hip and back pain.  She would like tipi's today if possible./fim    HISTORY OF PRESENT ILLNESS:  Dawn [name] is a 70 year old woman who was diagnosed with multiple sclerosis in 2001.  She is noting more pain, especially on her hips  Hip pain/LBP:   She has right > left hip pain and bilateral piriformis/sciatica pain.     PT helped some but not long term.   The hip pain improved after injection into the trochanteric bursa x 2 months.   Piriformis injections wear off after 2 months.     Orthopedics has seen her in the past and she was told that there was a labrum tear in the hip, but she was not felt to be a good surgical candidate.   Tramadol and motrin prn helps some but not long term.  Pain is worse when she lays on her side  MS:   She is on Aubagio and she tolerates it well.  The first week she had HA and diarrhea but no longer has either symptom.   She could not tolerate Tecfidera and had tolerability issues with Plegridy and Betaseron earlier.   She is not having any definite exacerbation though she has some sensory fluctuations.  She has hand numbness bilaterally that is intermittent.   She had cancelled an EMG earlier this year.  She has no recent MRI  Vision:   She notes mild blurred vision that is intermittent.    She feels both eyes are involved.   Once occurred while driving and once while watching TV.    She did not feel symptoms were just from one eye.     Gait/strength/sensation:     She has mildly wide gait but does not need to use a cane for short distances.   However, she uses a Rollator for longer distances.   .   She has  bilateral left leg dysesthesia with throbbing, helped by ice.   She stopped gabapentin as it had not helped.   Sometimes she gets sharp pains superimposed on the more constant dysesthesias.   She denies much weakness but has muscle stiffness and pain.  .  Bladder:   She has urinary frequency .   Hesitancy is better on tamsulosin qod.        Vesicare helps.   She has had several UTI's in 2016.     Fatigue/sleep:   She has fatigue that increases as a day goes on. This is both physical and cognitive.   Provigil helped better than phentermie but she had trouble falling asleep. Phentermine 15 mg is better tolerated than 37.5 mg and helps fatigue though she still feels weird.     She has some difficulty with sleep onset insomnia > sleep maintenance.   Vesicare has helped sleep with less nocturia.    Mood/cognition:    She had mild stable depression with apathy and decreased mood.  She is now doing an exercise class out in the community..   Cognitively, she has difficulty with short-term memory as well as with verbal fluency  MS History:    In 1994, she had an episode of severe vertigo  with gait ataxia. An MRI at that time was reportedly normal. The symptoms were felt to be due to allergies. About 7 years later she had vertigo, gait ataxia and diplopia. Also her left leg was giving out. Short after that she had a hysterectomy. Postoperatively, she was numb in both legs and she had a lumbar puncture which showed changes consistent with MS. Then, an MRI of the brain was performed showing changes consistent with MS. She was referred to Dr. Erling Cruz who her on Betaseron. Last year, she switched from Betaseron to Tecfidera, in the hope that she would be able to avoid shots as she was having severe skin reactions with knots.    However, she had a lot of difficulty tolerating Tecfidera and swithced to [place] in early 2016 and to Aubagio late 2016    REVIEW OF SYSTEMS:  Constitutional: No fevers, chills, sweats, or  change in appetite.   Reports fatigue Eyes: No visual changes, double vision, eye pain Ear, nose and throat: No hearing loss, ear pain, nasal congestion, sore throat Cardiovascular: No chest pain, palpitations Respiratory:  No shortness of breath at rest or with exertion.   No wheezes GastrointestinaI: No nausea, vomiting, diarrhea, abdominal pain, fecal incontinence Genitourinary:  see above. Musculoskeletal:  Pain in right > left hip, and lower back/piriformis Integumentary: No rash, pruritus.    Neurological: as above Psychiatric: No depression at this time.  No anxiety Endocrine: No palpitations, diaphoresis, change in appetite, change in weigh or increased thirst Hematologic/Lymphatic:  No anemia, purpura, petechiae. Allergic/Immunologic: No itchy/runny eyes, nasal congestion, recent allergic reactions, rashes  ALLERGIES: No Known Allergies  HOME MEDICATIONS: Outpatient Medications Prior to Visit  Medication Sig Dispense Refill  . amoxicillin (AMOXIL) 500 MG capsule     . aspirin 81 MG tablet Take 81 mg by mouth daily.    . calcium carbonate (OS-CAL) 600 MG TABS Take 600 mg by mouth daily.    . cholecalciferol (VITAMIN D) 1000 UNITS tablet Take 1,000 Units by mouth daily.     [name] Kitchen docusate sodium (COLACE) 100 MG capsule Take 200 mg by mouth at bedtime. Reported on 08/20/2015    . doxycycline (VIBRAMYCIN) 100 MG capsule     . fexofenadine (ALLEGRA) 180 MG tablet Take 180 mg by mouth daily.    [name] Kitchen MIMVEY LO 0.5-0.1 MG per tablet Take 1 tablet by mouth daily.  12  . Multiple Vitamins-Minerals (MULTIVITAMIN WITH MINERALS) tablet Take 1 tablet by mouth daily.    [name] Kitchen omeprazole (PRILOSEC) 40 MG capsule     . phentermine 15 MG capsule Take 1 capsule (15 mg total) by mouth every morning. 30 capsule 5  . Teriflunomide 14 MG TABS Take 14 mg by mouth daily.    . traMADol (ULTRAM) 50 MG tablet Take 1 tablet (50 mg total) by mouth 3 (three) times daily as needed. 90 tablet 2  . clonazePAM  (KLONOPIN) 0.5 MG tablet Take 1 tablet (0.5 mg total) by mouth at bedtime. 30 tablet 5  . VESICARE 5 MG tablet TAKE 1 TABLET (5 MG TOTAL) BY MOUTH DAILY. 30 tablet 11   No facility-administered medications prior to visit.     PAST MEDICAL HISTORY: Past Medical History:  Diagnosis Date  . Allergy   . Hot flashes, menopausal 10/13/2011   Estradiol started   . Multiple sclerosis ([place])   . Neuropathy ([place])   . Vision abnormalities     PAST SURGICAL HISTORY: Past Surgical History:  Procedure Laterality Date  . ABDOMINAL HYSTERECTOMY  2002   partial  . LUMBAR FUSION      FAMILY HISTORY: Family History  Problem Relation Age of Onset  . Heart failure Mother   . Heart failure Father   . Arthritis    . Hypertension      SOCIAL HISTORY:  Social History   Social History  . Marital status: Widowed    Spouse name: N/A  . Number of children: N/A  . Years of education: N/A   Occupational History  . retired    Social History Main Topics  . Smoking status: Former Smoker    Quit date: 08/05/1983  . Smokeless tobacco: Not on file  . Alcohol use 0.0 oz/week     Comment: rarely  . Drug use: No  . Sexual activity: Not Currently    Birth control/ protection: Post-menopausal   Other Topics Concern  . Not on file   Social History Narrative   Regular Exercise-no   Widowed   Retired              PHYSICAL EXAM  Vitals:   06/25/16 1043  BP: 124/78  Pulse: 72  Resp: 16  Weight: 176 lb 8 oz (80.1 kg)  Height: 5\' 2"  (1.575 m)    Body mass index is 32.28 kg/m.   General: The patient is well-developed and well-nourished and in no acute distress  Musculoskeletal:   She is moderately tender over the right > left trochanteric bursa and moderately tender over the right > left piriformis muscle.   Neurologic Exam  Mental status: The patient is alert and oriented x 3 at the time of the examination. The patient has apparent normal recent and remote memory, with an  apparently normal attention span and concentration ability.   Speech is normal.  Cranial nerves: Extraocular movements are full.    Facial symmetry is present. There is good facial sensation to soft touch bilaterally.Facial strength is normal.  Trapezius and sternocleidomastoid strength is normal. No dysarthria is noted.  The tongue is midline, and the patient has symmetric elevation of the soft palate. No obvious hearing deficits are noted.  Motor:  Muscle bulk is normal but tone is symmetric, more on the left.. Strength is 4+/5 in intrinsic hand muscles   Sensory: Sensory testing is intact to  Touch and vibration in the arms  She reports reduced left leg sensation to vibration.  Coordination: Cerebellar testing reveals reduced right finger-nose-finger and reduced heel-to-shin, left worse than right  Gait and station: Station is normal and gait is mildly wide. Tandem gait is very wide. Romberg is negative.   Reflexes: Deep tendon reflexes are symmetric and normal bilaterally.      DIAGNOSTIC DATA (LABS, IMAGING, TESTING) - I reviewed patient records, labs, notes, testing and imaging myself where available.  Lab Results  Component Value Date   WBC 5.0 02/18/2016   HGB 14.2 11/03/2011   HCT 41.8 02/18/2016   MCV 89 02/18/2016   PLT 209 02/18/2016      Component Value Date/Time   NA 142 02/18/2016 1111   K 5.0 02/18/2016 1111   CL 101 02/18/2016 1111   CO2 24 02/18/2016 1111   GLUCOSE 91 02/18/2016 1111   GLUCOSE 82 11/03/2011 0920   BUN 20 02/18/2016 1111   CREATININE 0.88 02/18/2016 1111   CALCIUM 9.8 02/18/2016 1111   PROT 6.4 02/18/2016 1111   ALBUMIN 4.3 02/18/2016 1111   AST 22 02/18/2016 1111   ALT 21 02/18/2016 1111   ALKPHOS 127 (  H) 02/18/2016 1111   BILITOT 0.3 02/18/2016 1111   GFRNONAA 68 02/18/2016 1111   GFRAA 78 02/18/2016 1111   Lab Results  Component Value Date   CHOL 203 (H) 11/03/2011   HDL 44.60 11/03/2011   LDLCALC 136 (H) 12/05/2009   LDLDIRECT  130.2 11/03/2011   TRIG 185.0 (H) 11/03/2011   CHOLHDL 5 11/03/2011   No results found for: HGBA1C No results found for: VITAMINB12 Lab Results  Component Value Date   TSH 3.80 11/03/2011       ASSESSMENT AND PLAN  MULTIPLE SCLEROSIS, PROGRESSIVE/RELAPSING - Plan: MR BRAIN W WO CONTRAST  Abnormality of gait - Plan: MR BRAIN W WO CONTRAST  Chronic fatigue  Trochanteric bursitis of right hip  Trochanteric bursitis of left hip  Sciatica, right side  Adjustment disorder with mixed anxiety and depressed mood  Disturbed cognition - Plan: MR BRAIN W WO CONTRAST   1.   Continue Aubagio.    We will check an MRI of the brain with and without contrast to determine if there has been any subclinical progression. If present, need to consider a different disease modifying therapy. 2.    Bilateral piriformis muscle injection with 40 mg Depo-Medrol in 3 mL Marcaine using sterile technique. She tolerated the procedure well. 3.    Bilateral trochanteric bursa injections with a total of 40 mg Depo-Medrol in Marcaine using sterile technique. She tolerated the procedure well. 4.    Change phentermine to Provigil for fatigue 5.    Refill med's 6.    Return to see me in about 4 months but is advised to call sooner if she has new or worsening neurologic symptoms.   Jasenia Weilbacher A. Felecia Shelling, MD, PhD [id number], 0000000 PM Certified in Neurology, Clinical Neurophysiology, Sleep Medicine, Pain Medicine and Neuroimaging  [facility] [address], [place], [place] [address] [phone]-

## 2016-06-26 ENCOUNTER — Telehealth: Payer: Self-pay

## 2016-06-26 ENCOUNTER — Telehealth: Payer: Self-pay | Admitting: *Deleted

## 2016-06-26 NOTE — Telephone Encounter (Signed)
Error--pt. receives Aubagio from [employer] (free drug program)/fim

## 2016-06-26 NOTE — Telephone Encounter (Signed)
I have attempted to complete a PA for Modafinil with pt's rx .plan  ([employer] Medicare RX Plans thru [place] called pharmacy services at [phone] and reached [facility].  Was told that they do not handle PA's for pt's plan, and pt. must call member services to find out who does.  I have spoken with [facility].  She will call customer service and call me back/fim

## 2016-06-26 NOTE — Telephone Encounter (Signed)
Pt called and said that rx for generic Provigil could not be filled due to cost w/ her insurance. Feels that "we may have been through this before." Also suddenly remembered that she should have gotten blood work done yesterday.

## 2016-06-26 NOTE — Telephone Encounter (Signed)
Pt returned RN's call. Confirmation  [id number] - and  [employer] [facility] will be calling RN. She said the person she talked with advised to use this direct number for member services [phone]

## 2016-06-26 NOTE — Telephone Encounter (Signed)
Modafinil PA completed and faxed to [facility] fax# [phone].   Modafinil is rx'd for MS related fatigue/fim

## 2016-06-27 NOTE — Telephone Encounter (Signed)
Fax received from [facility].  Modafinil PA approved thru 12-25-16, under Medicare Part D benefit.  PA# [id number]

## 2016-07-01 ENCOUNTER — Telehealth: Payer: Self-pay | Admitting: *Deleted

## 2016-07-01 NOTE — Telephone Encounter (Signed)
Aubagio PA completed and faxed to [facility], fax# [phone].  Pt. has tried and failed Betaseron, Avonex, Plegridy, Tecfidera/fim

## 2016-07-02 ENCOUNTER — Telehealth: Payer: Self-pay | Admitting: Neurology

## 2016-07-02 NOTE — Telephone Encounter (Signed)
Fax received from [facility] stating Aubagio is on pt's list of approved meds; PA is not needed at this time/fim

## 2016-07-02 NOTE — Telephone Encounter (Signed)
Noted/fim 

## 2016-07-02 NOTE — Telephone Encounter (Signed)
Patient is calling to thank you and Dr. Felecia Shelling for all your help. A returned call is not needed.

## 2016-07-07 ENCOUNTER — Telehealth: Payer: Self-pay | Admitting: Neurology

## 2016-07-07 MED ORDER — TERIFLUNOMIDE 14 MG PO TABS: 14.0000 mg | ORAL_TABLET | Freq: Every day | ORAL | 11 refills | Status: DC

## 2016-07-07 NOTE — Addendum Note (Signed)
Addended by: France Ravens I on: 07/07/2016 02:14 PM   Modules accepted: Orders

## 2016-07-07 NOTE — Telephone Encounter (Signed)
Patient requesting refill of Aubagio called to [facility].

## 2016-07-07 NOTE — Telephone Encounter (Signed)
Patient  is calling to schedule an appointment at [facility] for an MRI.

## 2016-07-07 NOTE — Telephone Encounter (Signed)
I called the patient back and faxed the order to [facility] and that she can call whenever to scheduled it

## 2016-07-07 NOTE — Telephone Encounter (Signed)
Aubagio rx. escribed to [facility] per pt's request/fim

## 2016-07-11 DIAGNOSIS — R269 Unspecified abnormalities of gait and mobility: Secondary | ICD-10-CM | POA: Diagnosis not present

## 2016-07-11 DIAGNOSIS — G35 Multiple sclerosis: Secondary | ICD-10-CM | POA: Diagnosis not present

## 2016-07-18 ENCOUNTER — Ambulatory Visit (INDEPENDENT_AMBULATORY_CARE_PROVIDER_SITE_OTHER): Payer: Self-pay

## 2016-07-18 DIAGNOSIS — Z0289 Encounter for other administrative examinations: Secondary | ICD-10-CM

## 2016-07-18 DIAGNOSIS — R269 Unspecified abnormalities of gait and mobility: Secondary | ICD-10-CM

## 2016-07-18 DIAGNOSIS — G35 Multiple sclerosis: Secondary | ICD-10-CM

## 2016-07-18 DIAGNOSIS — R4189 Other symptoms and signs involving cognitive functions and awareness: Secondary | ICD-10-CM

## 2016-07-28 ENCOUNTER — Telehealth: Payer: Self-pay | Admitting: *Deleted

## 2016-07-28 NOTE — Telephone Encounter (Signed)
LMOM that per [name], MRI brain showed no new MS changes.  I have requested last MRI brain and c-spine from [facility]/fim

## 2016-07-28 NOTE — Telephone Encounter (Signed)
-----   Message from Britt Bottom, MD sent at 07/25/2016 12:40 PM EST ----- Please let her know that the MRI of the brain did not show any brand-new MS lesions.   Could you check with [facility] and see if there are any old ones to compare. I think it has been many years since her previous MRI and there may not be one

## 2016-07-29 DIAGNOSIS — M79671 Pain in right foot: Secondary | ICD-10-CM | POA: Diagnosis not present

## 2016-10-28 ENCOUNTER — Encounter: Payer: Self-pay | Admitting: Neurology

## 2016-10-28 ENCOUNTER — Encounter (INDEPENDENT_AMBULATORY_CARE_PROVIDER_SITE_OTHER): Payer: Self-pay

## 2016-10-28 ENCOUNTER — Ambulatory Visit (INDEPENDENT_AMBULATORY_CARE_PROVIDER_SITE_OTHER): Payer: Medicare Other | Admitting: Neurology

## 2016-10-28 VITALS — BP 152/80 | HR 81 | Resp 16 | Ht 62.0 in | Wt 172.5 lb

## 2016-10-28 DIAGNOSIS — M79602 Pain in left arm: Secondary | ICD-10-CM | POA: Diagnosis not present

## 2016-10-28 DIAGNOSIS — G35 Multiple sclerosis: Secondary | ICD-10-CM

## 2016-10-28 DIAGNOSIS — G35D Multiple sclerosis, unspecified: Secondary | ICD-10-CM

## 2016-10-28 DIAGNOSIS — M5431 Sciatica, right side: Secondary | ICD-10-CM

## 2016-10-28 DIAGNOSIS — M7062 Trochanteric bursitis, left hip: Secondary | ICD-10-CM

## 2016-10-28 DIAGNOSIS — M7061 Trochanteric bursitis, right hip: Secondary | ICD-10-CM | POA: Diagnosis not present

## 2016-10-28 DIAGNOSIS — M79601 Pain in right arm: Secondary | ICD-10-CM | POA: Diagnosis not present

## 2016-10-28 DIAGNOSIS — R269 Unspecified abnormalities of gait and mobility: Secondary | ICD-10-CM | POA: Diagnosis not present

## 2016-10-28 DIAGNOSIS — R5382 Chronic fatigue, unspecified: Secondary | ICD-10-CM

## 2016-10-28 MED ORDER — CLONAZEPAM 0.5 MG PO TABS: 0.5000 mg | ORAL_TABLET | Freq: Every day | ORAL | 5 refills | Status: DC

## 2016-10-28 MED ORDER — MODAFINIL 200 MG PO TABS: ORAL_TABLET | ORAL | 5 refills | Status: DC

## 2016-10-28 NOTE — Progress Notes (Signed)
[facility]  PATIENT: Dawn [name] DOB: 02/05/1946  REFERRING CLINICIAN: Aura Dials HISTORY FROM: Patient REASON FOR VISIT: MS   HISTORICAL  CHIEF COMPLAINT:  Chief Complaint  Patient presents with  . Multiple Sclerosis    HISTORY OF PRESENT ILLNESS:  Dawn [name] is a 70 year old woman who was diagnosed with multiple sclerosis in 2001.     MS:   She is on Aubagio and she tolerates it well.  The first week she had HA and diarrhea but no longer has either symptom.   She could not tolerate Tecfidera and had tolerability issues with Plegridy and Betaseron earlier.   She is not having any definite exacerbation though she has some sensory fluctuations.  She has hand numbness bilaterally that is intermittent.   She had cancelled an EMG earlier this year.  The MRI performed January 2018 showed chronic MS lesions but no acute findings.   She also appears to have had a left cerebellar CVA (also chronic)  Elbow pain/Hip pain/LBP:   More recently, she has been having pain in the right elbow.   She notes it started after removing a lot of vines.    She also has had foot and ankle pain.   She has left > right hip pain improved for a while after trochanteric bursae injections.    The bilateral piriformis injections seemed to help the pain for a longer time and pelvic pain is also better.        Orthopedics has seen her in the past and she was told that there was a labrum tear in the hip, but she was not felt to be a good surgical candidate.   Tramadol and etodolac helps some but not long term.   Vision:   She notes mild blurred vision that is intermittent.    She feels both eyes are involved.   Once occurred while driving and once while watching TV.    She did not feel symptoms were just from one eye.     Gait/strength/sensation:     Gait seems worse with more pain.   She feels more muscle fatigue.   She walks without a cane mostly and uses a rollator for long walks.   The left leg has dysesthesia, not helped by gabapentin.     She denies much weakness but has muscle stiffness and pain.  .  Bladder:   She has urinary frequency .   Hesitancy is better on tamsulosin qod.  Vesicare helps.   She has had several UTI's in 2016.     Fatigue/sleep:   She has fatigue that is worse in the afternoons.    Phentermine 15 mg is better tolerated than 37.5 mg but helped less and still made her feel weird.   Provigil helps a bit. She has some difficulty with sleep onset insomnia > sleep maintenance.   Vesicare has helped sleep with less nocturia.    Mood/cognition:    She feels she is doing better with her mood.  She gets frustrated easiuly.  She is now doing an exercise class out in the community..   Cognitively, she has difficulty with short-term memory as well as with verbal fluency  MS History:    In 1994, she had an episode of severe vertigo with gait ataxia. An MRI at that time was reportedly normal. The symptoms were felt to be due to allergies. About 7 years later she had vertigo, gait ataxia and diplopia. Also her left leg was  giving out. Short after that she had a hysterectomy. Postoperatively, she was numb in both legs and she had a lumbar puncture which showed changes consistent with MS. Then, an MRI of the brain was performed showing changes consistent with MS. She was referred to Dr. Erling Cruz who her on Betaseron. Last year, she switched from Betaseron to Tecfidera, in the hope that she would be able to avoid shots as she was having severe skin reactions with knots.    However, she had a lot of difficulty tolerating Tecfidera and swithced to [place] in early 2016 and to Aubagio late 2016    REVIEW OF SYSTEMS:  Constitutional: No fevers, chills, sweats, or change in appetite.   Reports fatigue Eyes: No visual changes, double vision, eye pain Ear, nose and throat: No hearing loss, ear pain, nasal congestion, sore throat Cardiovascular: No chest pain,  palpitations Respiratory:  No shortness of breath at rest or with exertion.   No wheezes GastrointestinaI: No nausea, vomiting, diarrhea, abdominal pain, fecal incontinence Genitourinary:  see above. Musculoskeletal:  Pain in right > left hip, and lower back/piriformis Integumentary: No rash, pruritus.    Neurological: as above Psychiatric: No depression at this time.  No anxiety Endocrine: No palpitations, diaphoresis, change in appetite, change in weigh or increased thirst Hematologic/Lymphatic:  No anemia, purpura, petechiae. Allergic/Immunologic: No itchy/runny eyes, nasal congestion, recent allergic reactions, rashes  ALLERGIES: No Known Allergies  HOME MEDICATIONS: Outpatient Medications Prior to Visit  Medication Sig Dispense Refill  . aspirin 81 MG tablet Take 81 mg by mouth daily.    . calcium carbonate (OS-CAL) 600 MG TABS Take 600 mg by mouth daily.    . cholecalciferol (VITAMIN D) 1000 UNITS tablet Take 1,000 Units by mouth daily.     [name] Kitchen etodolac (LODINE) 400 MG tablet Take 1 tablet (400 mg total) by mouth 2 (two) times daily. 60 tablet 11  . fexofenadine (ALLEGRA) 180 MG tablet Take 180 mg by mouth daily.    [name] Kitchen MIMVEY LO 0.5-0.1 MG per tablet Take 1 tablet by mouth daily.  12  . Multiple Vitamins-Minerals (MULTIVITAMIN WITH MINERALS) tablet Take 1 tablet by mouth daily.    [name] Kitchen omeprazole (PRILOSEC) 40 MG capsule     . solifenacin (VESICARE) 5 MG tablet Take 1 tablet (5 mg total) by mouth daily. 30 tablet 11  . Teriflunomide 14 MG TABS Take 14 mg by mouth daily. 90 tablet 11  . traMADol (ULTRAM) 50 MG tablet Take 1 tablet (50 mg total) by mouth 3 (three) times daily as needed. 90 tablet 2  . clonazePAM (KLONOPIN) 0.5 MG tablet Take 1 tablet (0.5 mg total) by mouth at bedtime. 30 tablet 5  . modafinil (PROVIGIL) 200 MG tablet Take 1 tablet (200 mg total) by mouth daily. 30 tablet 5  . phentermine 15 MG capsule Take 1 capsule (15 mg total) by mouth every morning. (Patient not  taking: Reported on 10/28/2016) 30 capsule 5  . amoxicillin (AMOXIL) 500 MG capsule     . docusate sodium (COLACE) 100 MG capsule Take 200 mg by mouth at bedtime. Reported on 08/20/2015    . doxycycline (VIBRAMYCIN) 100 MG capsule      No facility-administered medications prior to visit.     PAST MEDICAL HISTORY: Past Medical History:  Diagnosis Date  . Allergy   . Hot flashes, menopausal 10/13/2011   Estradiol started   . Multiple sclerosis ([place])   . Neuropathy ([place])   . Vision abnormalities     PAST  SURGICAL HISTORY: Past Surgical History:  Procedure Laterality Date  . ABDOMINAL HYSTERECTOMY  2002   partial  . LUMBAR FUSION      FAMILY HISTORY: Family History  Problem Relation Age of Onset  . Heart failure Mother   . Heart failure Father   . Arthritis    . Hypertension      SOCIAL HISTORY:  Social History   Social History  . Marital status: Widowed    Spouse name: N/A  . Number of children: N/A  . Years of education: N/A   Occupational History  . retired    Social History Main Topics  . Smoking status: Former Smoker    Quit date: 08/05/1983  . Smokeless tobacco: Never Used  . Alcohol use 0.0 oz/week     Comment: rarely  . Drug use: No  . Sexual activity: Not Currently    Birth control/ protection: Post-menopausal   Other Topics Concern  . Not on file   Social History Narrative   Regular Exercise-no   Widowed   Retired              PHYSICAL EXAM  Vitals:   10/28/16 1321  BP: (!) 152/80  Pulse: 81  Resp: 16  Weight: 172 lb 8 oz (78.2 kg)  Height: 5\' 2"  (1.575 m)    Body mass index is 31.55 kg/m.   General: The patient is well-developed and well-nourished and in no acute distress  Musculoskeletal:   She is moderately tender over the right > left trochanteric bursa and moderately tender over the right > left piriformis muscle.    Also tender over the right.  Neurologic Exam  Mental status: The patient is alert and oriented x 3 at  the time of the examination. The patient has apparent normal recent and remote memory, with an apparently normal attention span and concentration ability.   Speech is normal.  Cranial nerves: Extraocular movements are full.  Facial strength and sensation is normal.  Trapezius and sternocleidomastoid strength is normal. No dysarthria is noted.  The tongue is midline, and the patient has symmetric elevation of the soft palate. No obvious hearing deficits are noted.  Motor:  Muscle bulk is normal but tone is symmetric, more on the left.. Strength is 4+/5 in intrinsic hand muscles   Sensory: Sensory testing is intact to touch and vibration in the arms  She reports reduced left leg sensation to vibration.  Coordination: Cerebellar testing reveals reduced right finger-nose-finger and reduced heel-to-shin, left worse than right  Gait and station: Station is normal and gait is mildly wide. Tandem gait is very wide. Romberg is negative.   Reflexes: Deep tendon reflexes are symmetric and normal bilaterally.      DIAGNOSTIC DATA (LABS, IMAGING, TESTING) - I reviewed patient records, labs, notes, testing and imaging myself where available.  Lab Results  Component Value Date   WBC 5.0 02/18/2016   HGB 14.2 11/03/2011   HCT 41.8 02/18/2016   MCV 89 02/18/2016   PLT 209 02/18/2016      Component Value Date/Time   NA 142 02/18/2016 1111   K 5.0 02/18/2016 1111   CL 101 02/18/2016 1111   CO2 24 02/18/2016 1111   GLUCOSE 91 02/18/2016 1111   GLUCOSE 82 11/03/2011 0920   BUN 20 02/18/2016 1111   CREATININE 0.88 02/18/2016 1111   CALCIUM 9.8 02/18/2016 1111   PROT 6.4 02/18/2016 1111   ALBUMIN 4.3 02/18/2016 1111   AST 22 02/18/2016 1111  ALT 21 02/18/2016 1111   ALKPHOS 127 (H) 02/18/2016 1111   BILITOT 0.3 02/18/2016 1111   GFRNONAA 68 02/18/2016 1111   GFRAA 78 02/18/2016 1111   Lab Results  Component Value Date   CHOL 203 (H) 11/03/2011   HDL 44.60 11/03/2011   LDLCALC 136 (H)  12/05/2009   LDLDIRECT 130.2 11/03/2011   TRIG 185.0 (H) 11/03/2011   CHOLHDL 5 11/03/2011   No results found for: HGBA1C No results found for: VITAMINB12 Lab Results  Component Value Date   TSH 3.80 11/03/2011       ASSESSMENT AND PLAN  MULTIPLE SCLEROSIS, PROGRESSIVE/RELAPSING  Sciatica, right side  Bilateral arm pain  Abnormality of gait  Chronic fatigue  Trochanteric bursitis of left hip  Trochanteric bursitis of right hip   1.   Continue Aubagio for MS.      Continue Provigil for fatigue 2.    Bilateral piriformis muscle injection and right triceps with 40 mg Depo-Medrol in 5 mL Marcaine using sterile technique. She tolerated the procedure well. 3.    Bilateral trochanteric bursa injections with a total of 40 mg Depo-Medrol in 4 cc Marcaine using sterile technique. She tolerated the procedure well. 4.    Refill other med's 6.    Return to see me in about 4 months but is advised to call sooner if she has new or worsening neurologic symptoms.  45 minutes face-to-face evaluation with greater than one half of the time counseling or coordinating care about her MS, muscular skeletal pain and other symptoms.  Richard A. Felecia Shelling, MD, PhD 6/31/4970, 2:63 PM Certified in Neurology, Clinical Neurophysiology, Sleep Medicine, Pain Medicine and Neuroimaging  [facility] [address], [place], [place] [address] [phone]

## 2016-11-25 DIAGNOSIS — Z79899 Other long term (current) drug therapy: Secondary | ICD-10-CM | POA: Diagnosis not present

## 2016-11-25 DIAGNOSIS — G4762 Sleep related leg cramps: Secondary | ICD-10-CM | POA: Diagnosis not present

## 2016-11-25 DIAGNOSIS — E559 Vitamin D deficiency, unspecified: Secondary | ICD-10-CM | POA: Diagnosis not present

## 2016-11-25 DIAGNOSIS — Z0000 Encounter for general adult medical examination without abnormal findings: Secondary | ICD-10-CM | POA: Diagnosis not present

## 2016-11-25 DIAGNOSIS — G35 Multiple sclerosis: Secondary | ICD-10-CM | POA: Diagnosis not present

## 2016-12-09 ENCOUNTER — Encounter: Payer: Self-pay | Admitting: Neurology

## 2016-12-09 ENCOUNTER — Encounter (INDEPENDENT_AMBULATORY_CARE_PROVIDER_SITE_OTHER): Payer: Self-pay

## 2016-12-09 ENCOUNTER — Ambulatory Visit (INDEPENDENT_AMBULATORY_CARE_PROVIDER_SITE_OTHER): Payer: Medicare Other | Admitting: Neurology

## 2016-12-09 VITALS — BP 151/89 | HR 79 | Resp 18 | Ht 62.0 in | Wt 171.5 lb

## 2016-12-09 DIAGNOSIS — R350 Frequency of micturition: Secondary | ICD-10-CM

## 2016-12-09 DIAGNOSIS — M25521 Pain in right elbow: Secondary | ICD-10-CM

## 2016-12-09 DIAGNOSIS — R269 Unspecified abnormalities of gait and mobility: Secondary | ICD-10-CM | POA: Diagnosis not present

## 2016-12-09 DIAGNOSIS — M5431 Sciatica, right side: Secondary | ICD-10-CM

## 2016-12-09 DIAGNOSIS — G35 Multiple sclerosis: Secondary | ICD-10-CM

## 2016-12-09 DIAGNOSIS — R5382 Chronic fatigue, unspecified: Secondary | ICD-10-CM | POA: Diagnosis not present

## 2016-12-09 NOTE — Progress Notes (Signed)
[facility]  PATIENT: Dawn [name] DOB: 08-02-1945  REFERRING CLINICIAN: Aura Dials HISTORY FROM: Patient REASON FOR VISIT: MS   HISTORICAL  CHIEF COMPLAINT:  Chief Complaint  Patient presents with  . Multiple Sclerosis    Sts. she continues to tolerate Aubagio well.  Sts. inj. given at last ov provided good reliief of right inguinal pain--but now pain has returned.  Right leg tends to give way when walking.  She has seen ortho for same, sts. they offered a procedure, but not much hope that it would be successful./fim    HISTORY OF PRESENT ILLNESS:  Dawn [name] is a 71 year old woman who was diagnosed with multiple sclerosis in 2001.     MS:   She is on Aubagio and she tolerates it well. She has occasional diarrhea.   She could not tolerate Tecfidera and had tolerability issues with Plegridy and Betaseron earlier.   She is not having any definite exacerbation though she has some sensory fluctuations.   The MRI performed January 2018 showed chronic MS lesions but no acute findings.   She also appears to have had a left cerebellar CVA (also chronic)  Hip pain/LBP:   She has had pain in her hips and pelvis.    She has seen ortho and was found to have a labrum tear and surgery was considered,     We have done trochanteric bursae injections and bilateral piriformis injections seemed to help the pain for a longer time and pelvic pain is also better.    They seemed to help some the first couple times but not as much the last time.      Tramadol and etodolac helps some but not long term.   Right elbow pain:   She also reports pain in her right elbow.  Pain increases as the day goes on and with use/lifting.     Vision:   She notes mild blurred vision that is intermittent.    She feels both eyes are involved.   Once occurred while driving and once while watching TV.    She did not feel symptoms were just from one eye.     Gait/strength/sensation:     Her gait has  been worse as she has had more pain.   She fell a couple times.   She mostly is using a cane now.     She notes more muscle fatigue.    She has dysesthesias mostly in the left leg that did not respond well to the medications..     She denies much weakness but has muscle stiffness and pain.  .  Bladder:   She has urinary frequency .   Hesitancy is better on tamsulosin qod.  Vesicare helped but seemed to help her more last year that this year.    She has seen [facility].   Constipation is better on Aubagio though she sometimes has diarrhea.    Fatigue/sleep:   She has fatigue that is worse in the afternoons.    She is back on modafinil with benefit. She has some difficulty with sleep onset insomnia > sleep maintenance, mostly due to pain she thinks.   Vesicare has helped sleep with less nocturia.    She has not been told she snores.    Mood/cognition:    She feels more down with more pain.  She gets frustrated easily.  She is now doing an exercise class out in the community (does sitting down --- it a an  arthritis group)..   Cognitively, she has difficulty with short-term memory as well as with verbal fluency  MS History:    In 1994, she had an episode of severe vertigo with gait ataxia. An MRI at that time was reportedly normal. The symptoms were felt to be due to allergies. About 7 years later she had vertigo, gait ataxia and diplopia. Also her left leg was giving out. Short after that she had a hysterectomy. Postoperatively, she was numb in both legs and she had a lumbar puncture which showed changes consistent with MS. Then, an MRI of the brain was performed showing changes consistent with MS. She was referred to Dr. Erling Cruz who her on Betaseron. Last year, she switched from Betaseron to Tecfidera, in the hope that she would be able to avoid shots as she was having severe skin reactions with knots.    However, she had a lot of difficulty tolerating Tecfidera and swithced to [place] in early 2016 and to  Aubagio late 2016    REVIEW OF SYSTEMS:  Constitutional: No fevers, chills, sweats, or change in appetite.   Reports fatigue Eyes: No visual changes, double vision, eye pain Ear, nose and throat: No hearing loss, ear pain, nasal congestion, sore throat Cardiovascular: No chest pain, palpitations Respiratory:  No shortness of breath at rest or with exertion.   No wheezes GastrointestinaI: No nausea, vomiting, diarrhea, abdominal pain, fecal incontinence Genitourinary:  see above. Musculoskeletal:  Pain in right > left hip, and lower back/piriformis Integumentary: No rash, pruritus.    Neurological: as above Psychiatric: No depression at this time.  No anxiety Endocrine: No palpitations, diaphoresis, change in appetite, change in weigh or increased thirst Hematologic/Lymphatic:  No anemia, purpura, petechiae. Allergic/Immunologic: No itchy/runny eyes, nasal congestion, recent allergic reactions, rashes  ALLERGIES: No Known Allergies  HOME MEDICATIONS: Outpatient Medications Prior to Visit  Medication Sig Dispense Refill  . aspirin 81 MG tablet Take 81 mg by mouth daily.    . calcium carbonate (OS-CAL) 600 MG TABS Take 600 mg by mouth daily.    . cholecalciferol (VITAMIN D) 1000 UNITS tablet Take 1,000 Units by mouth daily.     . clonazePAM (KLONOPIN) 0.5 MG tablet Take 1 tablet (0.5 mg total) by mouth at bedtime. 30 tablet 5  . etodolac (LODINE) 400 MG tablet Take 1 tablet (400 mg total) by mouth 2 (two) times daily. 60 tablet 11  . fexofenadine (ALLEGRA) 180 MG tablet Take 180 mg by mouth daily.    [name] Kitchen MIMVEY LO 0.5-0.1 MG per tablet Take 1 tablet by mouth daily.  12  . modafinil (PROVIGIL) 200 MG tablet One po qAM and 1/2 po q noon 45 tablet 5  . Multiple Vitamins-Minerals (MULTIVITAMIN WITH MINERALS) tablet Take 1 tablet by mouth daily.    [name] Kitchen omeprazole (PRILOSEC) 40 MG capsule     . solifenacin (VESICARE) 5 MG tablet Take 1 tablet (5 mg total) by mouth daily. 30 tablet 11  .  Teriflunomide 14 MG TABS Take 14 mg by mouth daily. 90 tablet 11  . traMADol (ULTRAM) 50 MG tablet Take 1 tablet (50 mg total) by mouth 3 (three) times daily as needed. 90 tablet 2  . phentermine 15 MG capsule Take 1 capsule (15 mg total) by mouth every morning. (Patient not taking: Reported on 10/28/2016) 30 capsule 5   No facility-administered medications prior to visit.     PAST MEDICAL HISTORY: Past Medical History:  Diagnosis Date  . Allergy   . Hot flashes,  menopausal 10/13/2011   Estradiol started   . Multiple sclerosis ([place])   . Neuropathy   . Vision abnormalities     PAST SURGICAL HISTORY: Past Surgical History:  Procedure Laterality Date  . ABDOMINAL HYSTERECTOMY  2002   partial  . LUMBAR FUSION      FAMILY HISTORY: Family History  Problem Relation Age of Onset  . Heart failure Mother   . Heart failure Father   . Arthritis Unknown   . Hypertension Unknown     SOCIAL HISTORY:  Social History   Social History  . Marital status: Widowed    Spouse name: N/A  . Number of children: N/A  . Years of education: N/A   Occupational History  . retired    Social History Main Topics  . Smoking status: Former Smoker    Quit date: 08/05/1983  . Smokeless tobacco: Never Used  . Alcohol use 0.0 oz/week     Comment: rarely  . Drug use: No  . Sexual activity: Not Currently    Birth control/ protection: Post-menopausal   Other Topics Concern  . Not on file   Social History Narrative   Regular Exercise-no   Widowed   Retired              PHYSICAL EXAM  Vitals:   12/09/16 1134  BP: (!) 151/89  Pulse: 79  Resp: 18  Weight: 171 lb 8 oz (77.8 kg)  Height: 5\' 2"  (1.575 m)    Body mass index is 31.37 kg/m.   General: The patient is well-developed and well-nourished and in no acute distress  Musculoskeletal:   She has tenderness over the piriformis muscles, right greater than left. There is also milder tenderness over the trochanteric bursae  bilaterally. She is tender over the right lateral epicondyles  Neurologic Exam  Mental status: The patient is alert and oriented x 3 at the time of the examination. The patient has apparent normal recent and remote memory, with an apparently normal attention span and concentration ability.   Speech is normal.  Cranial nerves: Extraocular movements are full.  Facial strength and sensation is normal.  Trapezius and sternocleidomastoid strength is normal. No dysarthria is noted.  The tongue is midline, and the patient has symmetric elevation of the soft palate. No obvious hearing deficits are noted.  Motor:  Muscle bulk is normal but tone is symmetric, more on the left.. Strength is 4+/5 in intrinsic hand muscles   Sensory: Sensory testing is intact to touch and vibration in the arms  She reports reduced left leg sensation to vibration.  Coordination: Cerebellar testing reveals reduced right finger-nose-finger and reduced heel-to-shin, left worse than right  Gait and station: Station is normal and gait is mildly wide. She can walk without a cane. Tandem gait is very wide. Romberg is negative.   Reflexes: Deep tendon reflexes are symmetric and normal bilaterally.      DIAGNOSTIC DATA (LABS, IMAGING, TESTING) - I reviewed patient records, labs, notes, testing and imaging myself where available.  Lab Results  Component Value Date   WBC 5.0 02/18/2016   HGB 14.2 11/03/2011   HCT 41.8 02/18/2016   MCV 89 02/18/2016   PLT 209 02/18/2016      Component Value Date/Time   NA 142 02/18/2016 1111   K 5.0 02/18/2016 1111   CL 101 02/18/2016 1111   CO2 24 02/18/2016 1111   GLUCOSE 91 02/18/2016 1111   GLUCOSE 82 11/03/2011 0920   BUN 20 02/18/2016 1111  CREATININE 0.88 02/18/2016 1111   CALCIUM 9.8 02/18/2016 1111   PROT 6.4 02/18/2016 1111   ALBUMIN 4.3 02/18/2016 1111   AST 22 02/18/2016 1111   ALT 21 02/18/2016 1111   ALKPHOS 127 (H) 02/18/2016 1111   BILITOT 0.3 02/18/2016 1111    GFRNONAA 68 02/18/2016 1111   GFRAA 78 02/18/2016 1111   Lab Results  Component Value Date   CHOL 203 (H) 11/03/2011   HDL 44.60 11/03/2011   LDLCALC 136 (H) 12/05/2009   LDLDIRECT 130.2 11/03/2011   TRIG 185.0 (H) 11/03/2011   CHOLHDL 5 11/03/2011   No results found for: HGBA1C No results found for: VITAMINB12 Lab Results  Component Value Date   TSH 3.80 11/03/2011       ASSESSMENT AND PLAN  MULTIPLE SCLEROSIS, PROGRESSIVE/RELAPSING  Abnormality of gait  Sciatica, right side  Right elbow pain  Urinary frequency  Chronic fatigue   1.   Continue Aubagio for MS.      Continue Provigil for fatigue 2.    Bilateral piriformis muscle injection with 60 mg Depo-Medrol in 5 mL Marcaine using sterile technique. She tolerated the procedure well. 3.    Right lateral epicondyle injection with a total of 20 mg Depo-Medrol in 4 cc Marcaine using sterile technique. She tolerated the procedure well. 4.    Continue other med's 6.    Return to see me in about 4 months but is advised to call sooner if she has new or worsening neurologic symptoms.  Richard A. Felecia Shelling, MD, PhD 7/94/3276, 14:70 PM Certified in Neurology, Clinical Neurophysiology, Sleep Medicine, Pain Medicine and Neuroimaging  [facility] [address], [place], [place] [address] [phone]-' \

## 2017-02-07 ENCOUNTER — Other Ambulatory Visit: Payer: Self-pay | Admitting: Neurology

## 2017-02-09 ENCOUNTER — Telehealth: Payer: Self-pay | Admitting: *Deleted

## 2017-02-09 NOTE — Telephone Encounter (Signed)
PA for Modafinil 200mg  1.5po daily completed via Cover My Meds today. Dx: MS related fatigue/fim

## 2017-02-10 NOTE — Telephone Encounter (Signed)
I have spoken with Dawn [name] this evening.  Modafinil PA has been completed but I don't have a determination yet.  She is requesting piriformis inj.  Appt. given for 02/18/17.  ([name] is ooo for the rest of this week)/fim

## 2017-02-10 NOTE — Telephone Encounter (Signed)
Pt call said [facility] does not have RX, she is wanting to know the status of PA. Please call

## 2017-02-11 NOTE — Telephone Encounter (Signed)
Fax received from [facility].  Modafinil approved thru 08/12/17 under her Medicare Part D benefit.  Pt. ID: [id number].  Ref# [id number]

## 2017-02-18 ENCOUNTER — Ambulatory Visit (INDEPENDENT_AMBULATORY_CARE_PROVIDER_SITE_OTHER): Payer: Medicare Other | Admitting: Neurology

## 2017-02-18 ENCOUNTER — Encounter: Payer: Self-pay | Admitting: Neurology

## 2017-02-18 ENCOUNTER — Encounter (INDEPENDENT_AMBULATORY_CARE_PROVIDER_SITE_OTHER): Payer: Self-pay

## 2017-02-18 VITALS — BP 145/86 | HR 66 | Resp 14 | Ht 62.0 in | Wt 171.0 lb

## 2017-02-18 DIAGNOSIS — R350 Frequency of micturition: Secondary | ICD-10-CM | POA: Diagnosis not present

## 2017-02-18 DIAGNOSIS — R269 Unspecified abnormalities of gait and mobility: Secondary | ICD-10-CM

## 2017-02-18 DIAGNOSIS — R5382 Chronic fatigue, unspecified: Secondary | ICD-10-CM

## 2017-02-18 DIAGNOSIS — R4189 Other symptoms and signs involving cognitive functions and awareness: Secondary | ICD-10-CM

## 2017-02-18 DIAGNOSIS — G35 Multiple sclerosis: Secondary | ICD-10-CM

## 2017-02-18 DIAGNOSIS — M5431 Sciatica, right side: Secondary | ICD-10-CM | POA: Diagnosis not present

## 2017-02-18 DIAGNOSIS — M2550 Pain in unspecified joint: Secondary | ICD-10-CM | POA: Insufficient documentation

## 2017-02-18 DIAGNOSIS — M25521 Pain in right elbow: Secondary | ICD-10-CM | POA: Diagnosis not present

## 2017-02-18 NOTE — Progress Notes (Signed)
[facility]  PATIENT: Dawn [name] DOB: [birth year]  REFERRING CLINICIAN: Aura Dials HISTORY FROM: Patient REASON FOR VISIT: MS   HISTORICAL  CHIEF COMPLAINT:  Chief Complaint  Patient presents with  . Multiple Sclerosis    Requesting bilat piriformis inj. today if appropriate/fim  . Back Pain    HISTORY OF PRESENT ILLNESS:  Dawn [name] is a 71 year old woman who was diagnosed with multiple sclerosis in 2001.    She is experiencing more pain.  Pain:   She is experiencing bilateral hip pain and more pain in her hands and feet.     We have done trochanteric bursae injections and bilateral piriformis injections seemed to help the pain for a longer time and pelvic pain is also better.  She has  A labrum tear on the right and has seen ortho.     She also has elbow pain on the right, helped x 2 months by steroid injection at the last visit.   Tramadol and etodolac helps some but not long term.   All the joint pain worsens with use and improves with a heating pad.   She has not been tested for inflammatory arthritis.   Other joints also hurt much of the time.  MS:   She is on Aubagio and she tolerates it well. She has occasional diarrhea.   She could not tolerate Tecfidera and had tolerability issues with Plegridy and Betaseron earlier.   She is not having any definite exacerbation though she has some sensory fluctuations.   The MRI performed January 2018 showed chronic MS lesions but no acute findings.   She also appears to have had a left cerebellar CVA (also chronic)  Vision:   She notes mild blurred vision that is intermittent.    She feels both eyes are involved.   Once occurred while driving and once while watching TV.    She did not feel symptoms were just from one eye.     Gait/strength/sensation:     She uses a cane for longer distances and if tired but walks without most of the time.  She uses it more when hips act up also.       She notes more muscle  fatigue.    She has dysesthesias mostly in the left leg that did not respond well to the medications.     She denies much weakness but has muscle stiffness and pain.   Bladder:   She has urinary frequency and hesitancy.  She is on tamsulosin qod.  Vesicare helped but seemed to help her more last year that this year.    She has seen [facility].  She has a little diarrhea at times.    Fatigue/sleep:   She has fatigue that is worse in the afternoons.    She is back on modafinil with benefit. She has some difficulty with sleep onset insomnia > sleep maintenance, mostly due to pain she thinks.   Vesicare has helped sleep with less nocturia.    She has not been told she snores.    Mood/cognition:    Mood is generslly gfood, occ down with her pain.   She gets frustrated easily.  She is now doing an exercise class out in the community (does sitting down --- it a an arthritis group)..   Cognitively, she has difficulty with short-term memory as well as with verbal fluency  MS History:    In 1994, she had an episode of severe vertigo with gait  ataxia. An MRI at that time was reportedly normal. The symptoms were felt to be due to allergies. About 7 years later she had vertigo, gait ataxia and diplopia. Also her left leg was giving out. Short after that she had a hysterectomy. Postoperatively, she was numb in both legs and she had a lumbar puncture which showed changes consistent with MS. Then, an MRI of the brain was performed showing changes consistent with MS. She was referred to Dr. Erling Cruz who her on Betaseron. Last year, she switched from Betaseron to Tecfidera, in the hope that she would be able to avoid shots as she was having severe skin reactions with knots.    However, she had a lot of difficulty tolerating Tecfidera and swithced to [place] in early 2016 and to Aubagio late 2016    REVIEW OF SYSTEMS:  Constitutional: No fevers, chills, sweats, or change in appetite.   Reports fatigue Eyes: No visual  changes, double vision, eye pain Ear, nose and throat: No hearing loss, ear pain, nasal congestion, sore throat Cardiovascular: No chest pain, palpitations Respiratory:  No shortness of breath at rest or with exertion.   No wheezes GastrointestinaI: No nausea, vomiting, diarrhea, abdominal pain, fecal incontinence Genitourinary:  see above. Musculoskeletal:  Pain in right > left hip, and lower back/piriformis Integumentary: No rash, pruritus.    Neurological: as above Psychiatric: No depression at this time.  No anxiety Endocrine: No palpitations, diaphoresis, change in appetite, change in weigh or increased thirst Hematologic/Lymphatic:  No anemia, purpura, petechiae. Allergic/Immunologic: No itchy/runny eyes, nasal congestion, recent allergic reactions, rashes  ALLERGIES: No Known Allergies  HOME MEDICATIONS: Outpatient Medications Prior to Visit  Medication Sig Dispense Refill  . aspirin 81 MG tablet Take 81 mg by mouth daily.    . calcium carbonate (OS-CAL) 600 MG TABS Take 600 mg by mouth daily.    . cholecalciferol (VITAMIN D) 1000 UNITS tablet Take 1,000 Units by mouth daily.     . clonazePAM (KLONOPIN) 0.5 MG tablet Take 1 tablet (0.5 mg total) by mouth at bedtime. 30 tablet 5  . etodolac (LODINE) 400 MG tablet Take 1 tablet (400 mg total) by mouth 2 (two) times daily. 60 tablet 11  . fexofenadine (ALLEGRA) 180 MG tablet Take 180 mg by mouth daily.    [name] Kitchen MIMVEY LO 0.5-0.1 MG per tablet Take 1 tablet by mouth daily.  12  . modafinil (PROVIGIL) 200 MG tablet One po qAM and 1/2 po q noon 45 tablet 5  . Multiple Vitamins-Minerals (MULTIVITAMIN WITH MINERALS) tablet Take 1 tablet by mouth daily.    [name] Kitchen omeprazole (PRILOSEC) 40 MG capsule     . Teriflunomide 14 MG TABS Take 14 mg by mouth daily. 90 tablet 11  . traMADol (ULTRAM) 50 MG tablet Take 1 tablet (50 mg total) by mouth 3 (three) times daily as needed. 90 tablet 2  . VESICARE 5 MG tablet TAKE 1 TABLET (5 MG TOTAL) BY MOUTH  DAILY. 30 tablet 11   No facility-administered medications prior to visit.     PAST MEDICAL HISTORY: Past Medical History:  Diagnosis Date  . Allergy   . Hot flashes, menopausal 10/13/2011   Estradiol started   . Multiple sclerosis ([place])   . Neuropathy   . Vision abnormalities     PAST SURGICAL HISTORY: Past Surgical History:  Procedure Laterality Date  . ABDOMINAL HYSTERECTOMY  2002   partial  . LUMBAR FUSION      FAMILY HISTORY: Family History  Problem Relation  Age of Onset  . Heart failure Mother   . Heart failure Father   . Arthritis Unknown   . Hypertension Unknown     SOCIAL HISTORY:  Social History   Social History  . Marital status: Widowed    Spouse name: N/A  . Number of children: N/A  . Years of education: N/A   Occupational History  . retired    Social History Main Topics  . Smoking status: Former Smoker    Quit date: 08/05/1983  . Smokeless tobacco: Never Used  . Alcohol use 0.0 oz/week     Comment: rarely  . Drug use: No  . Sexual activity: Not Currently    Birth control/ protection: Post-menopausal   Other Topics Concern  . Not on file   Social History Narrative   Regular Exercise-no   Widowed   Retired              PHYSICAL EXAM  Vitals:   02/18/17 0910  BP: (!) 145/86  Pulse: 66  Resp: 14  Weight: 171 lb (77.6 kg)  Height: '5\' 2"'  (1.575 m)    Body mass index is 31.28 kg/m.   General: The patient is well-developed and well-nourished and in no acute distress  Musculoskeletal:   She has tenderness over the piriformis muscles, right greater than left. There is also milder tenderness over the trochanteric bursae bilaterally. She is tender over the right lateral epicondyles  Neurologic Exam  Mental status: The patient is alert and oriented x 3 at the time of the examination. The patient has apparent normal recent and remote memory, with an apparently normal attention span and concentration ability.   Speech is  normal.  Cranial nerves: Extraocular movements are full.  Facial strength and sensation is normal.  Trapezius and sternocleidomastoid strength is normal. No dysarthria is noted.  The tongue is midline, and the patient has symmetric elevation of the soft palate. No obvious hearing deficits are noted.  Motor:  Muscle bulk is normal but tone is symmetric, more on the left.. Strength is 4+/5 in intrinsic hand muscles   Sensory: Sensory testing is intact to touch and vibration in the arms  She reports reduced left leg sensation to vibration.  Coordination: Cerebellar testing reveals reduced right finger-nose-finger and reduced heel-to-shin, left worse than right  Gait and station: Station is normal and gait is mildly wide. She can walk without a cane. Tandem gait is very wide. Romberg is negative.   Reflexes: Deep tendon reflexes are symmetric and normal bilaterally.      DIAGNOSTIC DATA (LABS, IMAGING, TESTING) - I reviewed patient records, labs, notes, testing and imaging myself where available.  Lab Results  Component Value Date   WBC 5.0 02/18/2016   HGB 13.8 02/18/2016   HCT 41.8 02/18/2016   MCV 89 02/18/2016   PLT 209 02/18/2016      Component Value Date/Time   NA 142 02/18/2016 1111   K 5.0 02/18/2016 1111   CL 101 02/18/2016 1111   CO2 24 02/18/2016 1111   GLUCOSE 91 02/18/2016 1111   GLUCOSE 82 11/03/2011 0920   BUN 20 02/18/2016 1111   CREATININE 0.88 02/18/2016 1111   CALCIUM 9.8 02/18/2016 1111   PROT 6.4 02/18/2016 1111   ALBUMIN 4.3 02/18/2016 1111   AST 22 02/18/2016 1111   ALT 21 02/18/2016 1111   ALKPHOS 127 (H) 02/18/2016 1111   BILITOT 0.3 02/18/2016 1111   GFRNONAA 68 02/18/2016 1111   GFRAA 78 02/18/2016 1111  Lab Results  Component Value Date   CHOL 203 (H) 11/03/2011   HDL 44.60 11/03/2011   LDLCALC 136 (H) 12/05/2009   LDLDIRECT 130.2 11/03/2011   TRIG 185.0 (H) 11/03/2011   CHOLHDL 5 11/03/2011   No results found for: HGBA1C No results  found for: VITAMINB12 Lab Results  Component Value Date   TSH 3.80 11/03/2011       ASSESSMENT AND PLAN  MULTIPLE SCLEROSIS, PROGRESSIVE/RELAPSING - Plan: Hepatic function panel, CBC with Differential/Platelet  Sciatica, right side  Right elbow pain  Abnormality of gait  Chronic fatigue  Urinary frequency  Disturbed cognition  Multiple joint pain - Plan: Rheumatoid factor, Sedimentation rate, ANA w/Reflex   1.   She will continue Aubagio for MS. Continue Provigil for fatigue.   2.    Bilateral piriformis muscle injections with 60 mg Depo-Medrol in 5 mL Marcaine using sterile technique. She tolerated the procedure well. 3.    Right lateral epicondyle injection with a total of 20 mg Depo-Medrol in 4 cc Marcaine using sterile technique. She tolerated the procedure well.  4.    Check ANA, ESR and RA for multiple joint pain  5.    Return to see me in about 4 months but is advised to call sooner if she has new or worsening neurologic symptoms.    Broedy Osbourne A. Felecia Shelling, MD, PhD 08/27/6699, 10:03 PM Certified in Neurology, Clinical Neurophysiology, Sleep Medicine, Pain Medicine and Neuroimaging  [facility] [address], [place], [place] [address] [phone]-' \

## 2017-02-19 LAB — CBC WITH DIFFERENTIAL/PLATELET
Basophils Absolute: 0.1 10*3/uL (ref 0.0–0.2)
Basos: 1 %
EOS (ABSOLUTE): 0.2 10*3/uL (ref 0.0–0.4)
EOS: 4 %
HEMATOCRIT: 42.8 % (ref 34.0–46.6)
HEMOGLOBIN: 13.9 g/dL (ref 11.1–15.9)
IMMATURE GRANULOCYTES: 0 %
Immature Grans (Abs): 0 10*3/uL (ref 0.0–0.1)
LYMPHS: 17 %
Lymphocytes Absolute: 0.8 10*3/uL (ref 0.7–3.1)
MCH: 29.6 pg (ref 26.6–33.0)
MCHC: 32.5 g/dL (ref 31.5–35.7)
MCV: 91 fL (ref 79–97)
MONOCYTES: 7 %
MONOS ABS: 0.3 10*3/uL (ref 0.1–0.9)
NEUTROS PCT: 71 %
Neutrophils Absolute: 3.2 10*3/uL (ref 1.4–7.0)
Platelets: 185 10*3/uL (ref 150–379)
RBC: 4.7 x10E6/uL (ref 3.77–5.28)
RDW: 15 % (ref 12.3–15.4)
WBC: 4.6 10*3/uL (ref 3.4–10.8)

## 2017-02-19 LAB — HEPATIC FUNCTION PANEL
ALT: 14 IU/L (ref 0–32)
AST: 18 IU/L (ref 0–40)
Albumin: 4.4 g/dL (ref 3.6–4.8)
Alkaline Phosphatase: 113 IU/L (ref 39–117)
Bilirubin Total: 0.3 mg/dL (ref 0.0–1.2)
Bilirubin, Direct: 0.09 mg/dL (ref 0.00–0.40)
TOTAL PROTEIN: 6.2 g/dL (ref 6.0–8.5)

## 2017-02-19 LAB — ENA+DNA/DS+SJORGEN'S
ENA RNP AB: 0.4 AI (ref 0.0–0.9)
ENA SSA (RO) Ab: 6.1 AI — ABNORMAL HIGH (ref 0.0–0.9)
ENA SSB (LA) Ab: <0.2 AI (ref 0.0–0.9)
dsDNA Ab: <1 IU/mL (ref 0–9)

## 2017-02-19 LAB — RHEUMATOID FACTOR

## 2017-02-19 LAB — SEDIMENTATION RATE: Sed Rate: 2 mm/hr (ref 0–40)

## 2017-02-19 LAB — ANA W/REFLEX: ANA: POSITIVE — AB

## 2017-02-20 ENCOUNTER — Telehealth: Payer: Self-pay | Admitting: Neurology

## 2017-02-20 DIAGNOSIS — M79641 Pain in right hand: Secondary | ICD-10-CM

## 2017-02-20 DIAGNOSIS — M79642 Pain in left hand: Secondary | ICD-10-CM

## 2017-02-20 DIAGNOSIS — M79643 Pain in unspecified hand: Secondary | ICD-10-CM | POA: Insufficient documentation

## 2017-02-20 DIAGNOSIS — M2550 Pain in unspecified joint: Secondary | ICD-10-CM

## 2017-02-20 NOTE — Telephone Encounter (Signed)
I called her about the lab results. The ANA was positive (SSA/RO =  6.1).  Her worse joint pain is in the hands. I would go ahead and refer her to see rheumatology and also get hand x-rays.  The buttock and elbow pain are better after the injections.

## 2017-02-25 ENCOUNTER — Other Ambulatory Visit: Payer: Self-pay | Admitting: Neurology

## 2017-02-25 ENCOUNTER — Telehealth: Payer: Self-pay

## 2017-02-25 ENCOUNTER — Ambulatory Visit
Admission: RE | Admit: 2017-02-25 | Discharge: 2017-02-25 | Disposition: A | Discharge status: Home / Self Care | Payer: Medicare Other | Source: Ambulatory Visit | Attending: Neurology | Admitting: Neurology

## 2017-02-25 DIAGNOSIS — M2550 Pain in unspecified joint: Secondary | ICD-10-CM

## 2017-02-25 DIAGNOSIS — M79642 Pain in left hand: Secondary | ICD-10-CM

## 2017-02-25 DIAGNOSIS — M189 Osteoarthritis of first carpometacarpal joint, unspecified: Secondary | ICD-10-CM | POA: Diagnosis not present

## 2017-02-25 DIAGNOSIS — M79641 Pain in right hand: Secondary | ICD-10-CM

## 2017-02-25 NOTE — Telephone Encounter (Signed)
Rn receive calf from [facility] that she can see the order for hand x ray, but can click on it. She wanted the order deleted,and put back in. Rn suggested the order be fax to her attention. PT was there getting it done this am. Rn fax order to [phone] twice. It was confirmed.

## 2017-02-27 ENCOUNTER — Telehealth: Payer: Self-pay

## 2017-02-27 NOTE — Telephone Encounter (Signed)
-----   Message from Britt Bottom, MD sent at 02/25/2017  1:40 PM EDT ----- The x-rays of the hands show arthritis at the base of the right thumb. This looks more like the wear and tear type of arthritis (osteoarthritis) and not rheumatoid arthritis.

## 2017-02-27 NOTE — Telephone Encounter (Signed)
I called pt. I advised her that the x-rays of her hands show arthritis at the base of her right thumb, looks more like OA than RA. I reminded pt of her appt in September. Pt verbalized understanding of results. Pt had no questions at this time but was encouraged to call back if questions arise.

## 2017-03-02 ENCOUNTER — Ambulatory Visit: Payer: Medicare Other | Admitting: Neurology

## 2017-04-14 ENCOUNTER — Ambulatory Visit (INDEPENDENT_AMBULATORY_CARE_PROVIDER_SITE_OTHER): Payer: Medicare Other | Admitting: Neurology

## 2017-04-14 ENCOUNTER — Encounter: Payer: Self-pay | Admitting: Neurology

## 2017-04-14 VITALS — BP 165/97 | HR 72 | Resp 16 | Ht 62.0 in | Wt 167.5 lb

## 2017-04-14 DIAGNOSIS — R5382 Chronic fatigue, unspecified: Secondary | ICD-10-CM

## 2017-04-14 DIAGNOSIS — M7061 Trochanteric bursitis, right hip: Secondary | ICD-10-CM | POA: Diagnosis not present

## 2017-04-14 DIAGNOSIS — G35 Multiple sclerosis: Secondary | ICD-10-CM

## 2017-04-14 DIAGNOSIS — R269 Unspecified abnormalities of gait and mobility: Secondary | ICD-10-CM | POA: Diagnosis not present

## 2017-04-14 DIAGNOSIS — M2550 Pain in unspecified joint: Secondary | ICD-10-CM

## 2017-04-14 DIAGNOSIS — R350 Frequency of micturition: Secondary | ICD-10-CM

## 2017-04-14 MED ORDER — GABAPENTIN 300 MG PO CAPS: 300.0000 mg | ORAL_CAPSULE | Freq: Three times a day (TID) | ORAL | 11 refills | Status: DC

## 2017-04-14 MED ORDER — LEFLUNOMIDE 20 MG PO TABS: 20.0000 mg | ORAL_TABLET | Freq: Every day | ORAL | 11 refills | Status: DC

## 2017-04-14 NOTE — Progress Notes (Signed)
[facility]  PATIENT: Dawn [name] DOB: 1945/11/25  REFERRING CLINICIAN: Aura Dials HISTORY FROM: Patient REASON FOR VISIT: MS   HISTORICAL  CHIEF COMPLAINT:  Chief Complaint  Patient presents with  . Multiple Sclerosis    Sts. she continues to tolerate Aubagio well.  Is having new sx's--burning pain in both feet at night.  Apercreme helps some. Also having intermittent dark colored urine without other uti sx.  Also intermittent palpitations. More forgetful--"I will forget something I've just done."   . Patient Assistance    Unable to secure pt. assistance for Aubagio. Has spoken with [employer] and sts. they told her to call different foundations daily in case funding becomes available.  She has one month supply of Aubagio left, and after that is not able to afford more./fim    HISTORY OF PRESENT ILLNESS:  Dawn [name] is a 71 year old woman who was diagnosed with multiple sclerosis in 2001.      Update 04/14/2017: She is on Aubagio but is just about out.   [employer] is not able to help her get Aubagio without a very high copay.  She is needing to call the "charity" daily to see if new funds are available.   We discussed trying Leflunomide which is a precursor of teriflunomide and used for rheumatoid arthritis.    I have used this for other patients with the same problems getting medication.     For the most part, her MS is stable but she is noting more burning dysesthesia in her feet.   Aspercreme helps Tramadol was not well tolerated.   Etodolac helps the arthritic pain some.   Her elbow pain improved with an injection.    A piriformis injection only helped a week.   She has myalgias throughout her body.     She has had dark urine a few times but it only lasted one day.    Bladder function is unchanged and Vesicare helps the frequency and urgency.  .   Vision sometimes seems blurry but this fluctuates.     Fatigue bothers her daily.   Provigil  helps some but incompletely.  Mood has been worse since she found out she wasn't going to be getting copay assistance for Aubagio ________________________________________________ From 02/18/2017:  Pain:   She is experiencing bilateral hip pain and more pain in her hands and feet.     We have done trochanteric bursae injections and bilateral piriformis injections seemed to help the pain for a longer time and pelvic pain is also better.  She has  A labrum tear on the right and has seen ortho.     She also has elbow pain on the right, helped x 2 months by steroid injection at the last visit.   Tramadol and etodolac helps some but not long term.   All the joint pain worsens with use and improves with a heating pad.   She has not been tested for inflammatory arthritis.   Other joints also hurt much of the time.  MS:   She is on Aubagio and she tolerates it well. She has occasional diarrhea.   She could not tolerate Tecfidera and had tolerability issues with Plegridy and Betaseron earlier.   She is not having any definite exacerbation though she has some sensory fluctuations.   The MRI performed January 2018 showed chronic MS lesions but no acute findings.   She also appears to have had a left cerebellar CVA (  also chronic)  Vision:   She notes mild blurred vision that is intermittent.    She feels both eyes are involved.   Once occurred while driving and once while watching TV.    She did not feel symptoms were just from one eye.     Gait/strength/sensation:     She uses a cane for longer distances and if tired but walks without most of the time.  She uses it more when hips act up also.       She notes more muscle fatigue.    She has dysesthesias mostly in the left leg that did not respond well to the medications.     She denies much weakness but has muscle stiffness and pain.   Bladder:   She has urinary frequency and hesitancy.  She is on tamsulosin qod.  Vesicare helped but seemed to help her more last year  that this year.    She has seen [facility].  She has a little diarrhea at times.    Fatigue/sleep:   She has fatigue that is worse in the afternoons.    She is back on modafinil with benefit. She has some difficulty with sleep onset insomnia > sleep maintenance, mostly due to pain she thinks.   Vesicare has helped sleep with less nocturia.    She has not been told she snores.    Mood/cognition:    Mood is generslly gfood, occ down with her pain.   She gets frustrated easily.  She is now doing an exercise class out in the community (does sitting down --- it a an arthritis group)..   Cognitively, she has difficulty with short-term memory as well as with verbal fluency  MS History:    In 1994, she had an episode of severe vertigo with gait ataxia. An MRI at that time was reportedly normal. The symptoms were felt to be due to allergies. About 7 years later she had vertigo, gait ataxia and diplopia. Also her left leg was giving out. Short after that she had a hysterectomy. Postoperatively, she was numb in both legs and she had a lumbar puncture which showed changes consistent with MS. Then, an MRI of the brain was performed showing changes consistent with MS. She was referred to Dr. Erling Cruz who her on Betaseron. Last year, she switched from Betaseron to Tecfidera, in the hope that she would be able to avoid shots as she was having severe skin reactions with knots.    However, she had a lot of difficulty tolerating Tecfidera and swithced to [place] in early 2016 and to Aubagio late 2016    REVIEW OF SYSTEMS:  Constitutional: No fevers, chills, sweats, or change in appetite.   Reports fatigue Eyes: No visual changes, double vision, eye pain Ear, nose and throat: No hearing loss, ear pain, nasal congestion, sore throat Cardiovascular: No chest pain, palpitations Respiratory:  No shortness of breath at rest or with exertion.   No wheezes GastrointestinaI: No nausea, vomiting, diarrhea, abdominal pain,  fecal incontinence Genitourinary:  see above. Musculoskeletal:  Pain in right > left hip, and lower back/piriformis Integumentary: No rash, pruritus.    Neurological: as above Psychiatric: No depression at this time.  No anxiety Endocrine: No palpitations, diaphoresis, change in appetite, change in weigh or increased thirst Hematologic/Lymphatic:  No anemia, purpura, petechiae. Allergic/Immunologic: No itchy/runny eyes, nasal congestion, recent allergic reactions, rashes  ALLERGIES: No Known Allergies  HOME MEDICATIONS: Outpatient Medications Prior to Visit  Medication Sig Dispense Refill  .  aspirin 81 MG tablet Take 81 mg by mouth daily.    . calcium carbonate (OS-CAL) 600 MG TABS Take 600 mg by mouth daily.    . cholecalciferol (VITAMIN D) 1000 UNITS tablet Take 1,000 Units by mouth daily.     . clonazePAM (KLONOPIN) 0.5 MG tablet Take 1 tablet (0.5 mg total) by mouth at bedtime. 30 tablet 5  . etodolac (LODINE) 400 MG tablet Take 1 tablet (400 mg total) by mouth 2 (two) times daily. 60 tablet 11  . fexofenadine (ALLEGRA) 180 MG tablet Take 180 mg by mouth daily.    [name] Kitchen MIMVEY LO 0.5-0.1 MG per tablet Take 1 tablet by mouth daily.  12  . modafinil (PROVIGIL) 200 MG tablet One po qAM and 1/2 po q noon 45 tablet 5  . Multiple Vitamins-Minerals (MULTIVITAMIN WITH MINERALS) tablet Take 1 tablet by mouth daily.    [name] Kitchen omeprazole (PRILOSEC) 40 MG capsule     . Teriflunomide 14 MG TABS Take 14 mg by mouth daily. 90 tablet 11  . traMADol (ULTRAM) 50 MG tablet Take 1 tablet (50 mg total) by mouth 3 (three) times daily as needed. 90 tablet 2  . VESICARE 5 MG tablet TAKE 1 TABLET (5 MG TOTAL) BY MOUTH DAILY. 30 tablet 11   No facility-administered medications prior to visit.     PAST MEDICAL HISTORY: Past Medical History:  Diagnosis Date  . Allergy   . Hot flashes, menopausal 10/13/2011   Estradiol started   . Multiple sclerosis ([place])   . Neuropathy   . Vision abnormalities     PAST  SURGICAL HISTORY: Past Surgical History:  Procedure Laterality Date  . ABDOMINAL HYSTERECTOMY  2002   partial  . LUMBAR FUSION      FAMILY HISTORY: Family History  Problem Relation Age of Onset  . Heart failure Mother   . Heart failure Father   . Arthritis Unknown   . Hypertension Unknown     SOCIAL HISTORY:  Social History   Social History  . Marital status: Widowed    Spouse name: N/A  . Number of children: N/A  . Years of education: N/A   Occupational History  . retired    Social History Main Topics  . Smoking status: Former Smoker    Quit date: 08/05/1983  . Smokeless tobacco: Never Used  . Alcohol use 0.0 oz/week     Comment: rarely  . Drug use: No  . Sexual activity: Not Currently    Birth control/ protection: Post-menopausal   Other Topics Concern  . Not on file   Social History Narrative   Regular Exercise-no   Widowed   Retired              PHYSICAL EXAM  Vitals:   04/14/17 1255  BP: (!) 165/97  Pulse: 72  Resp: 16  Weight: 167 lb 8 oz (76 kg)  Height: 5\' 2"  (1.575 m)    Body mass index is 30.64 kg/m.   General: The patient is well-developed and well-nourished and in no acute distress  Musculoskeletal:  She is tender over the piriformis muscles, right greater than left and over the trochanteric bursae, greater than left. She has less tenderness over the right lateral epicondyle.  Neurologic Exam  Mental status: The patient is alert and oriented x 3 at the time of the examination. The patient has apparent normal recent and remote memory, with an apparently normal attention span and concentration ability.   Speech is normal.  Cranial  nerves: Extraocular movements are full.  Facial strength and sensation is normal. Trapezius strength is normal. No obvious hearing deficits are noted.  Motor:  Muscle bulk is normal but tone is symmetric, more on the left.. Strength is 4+/5 in intrinsic hand muscles   Sensory: Sensory testing is intact  to touch and vibration in the arms she has reduced vibration sensation at both ankles and almost absent sensation to vibration in the toes.  Coordination: Cerebellar testing reveals reduced right finger-nose-finger and reduced heel-to-shin, left worse than right  Gait and station: Station is normal and gait is mildly wide. She has difficulty doing a tandem gait.. Romberg is negative.   Reflexes: Deep tendon reflexes are symmetric and normal bilaterally.      DIAGNOSTIC DATA (LABS, IMAGING, TESTING) - I reviewed patient records, labs, notes, testing and imaging myself where available.  Lab Results  Component Value Date   WBC 4.6 02/18/2017   HGB 13.9 02/18/2017   HCT 42.8 02/18/2017   MCV 91 02/18/2017   PLT 185 02/18/2017      Component Value Date/Time   NA 142 02/18/2016 1111   K 5.0 02/18/2016 1111   CL 101 02/18/2016 1111   CO2 24 02/18/2016 1111   GLUCOSE 91 02/18/2016 1111   GLUCOSE 82 11/03/2011 0920   BUN 20 02/18/2016 1111   CREATININE 0.88 02/18/2016 1111   CALCIUM 9.8 02/18/2016 1111   PROT 6.2 02/18/2017 0946   ALBUMIN 4.4 02/18/2017 0946   AST 18 02/18/2017 0946   ALT 14 02/18/2017 0946   ALKPHOS 113 02/18/2017 0946   BILITOT 0.3 02/18/2017 0946   GFRNONAA 68 02/18/2016 1111   GFRAA 78 02/18/2016 1111   Lab Results  Component Value Date   CHOL 203 (H) 11/03/2011   HDL 44.60 11/03/2011   LDLCALC 136 (H) 12/05/2009   LDLDIRECT 130.2 11/03/2011   TRIG 185.0 (H) 11/03/2011   CHOLHDL 5 11/03/2011   No results found for: HGBA1C No results found for: VITAMINB12 Lab Results  Component Value Date   TSH 3.80 11/03/2011       ASSESSMENT AND PLAN  MULTIPLE SCLEROSIS, PROGRESSIVE/RELAPSING  Abnormality of gait  Chronic fatigue  Urinary frequency  Multiple joint pain  Trochanteric bursitis of right hip   1.   Change Aubagio to leflunomide. We discussed that it has the exact same mechanism of action.     2.    To help with her neuropathic pain  in the feet and her myalgias, I will start gabapentin and titrate up to 900 mg a day. 3.   She will be seeing rheumatology soon. The ANA was elevated (positive SSA/Ro) 4.    Continue other medications.  5.    Return to see me in about 4 months but is advised to call sooner if she has new or worsening neurologic symptoms.    Zakayla Martinec A. Felecia Shelling, MD, PhD 6/31/4970, 2:63 PM Certified in Neurology, Clinical Neurophysiology, Sleep Medicine, Pain Medicine and Neuroimaging  [facility] [address], [place], [place] [address] [phone]-' \

## 2017-04-27 ENCOUNTER — Other Ambulatory Visit: Payer: Self-pay | Admitting: Neurology

## 2017-04-30 ENCOUNTER — Other Ambulatory Visit: Payer: Self-pay | Admitting: Neurology

## 2017-04-30 DIAGNOSIS — Z23 Encounter for immunization: Secondary | ICD-10-CM | POA: Diagnosis not present

## 2017-05-17 ENCOUNTER — Other Ambulatory Visit: Payer: Self-pay | Admitting: Neurology

## 2017-05-27 ENCOUNTER — Other Ambulatory Visit: Payer: Self-pay | Admitting: Neurology

## 2017-06-02 ENCOUNTER — Telehealth: Payer: Self-pay | Admitting: Neurology

## 2017-06-02 MED ORDER — OMEPRAZOLE 40 MG PO CPDR: 40.0000 mg | DELAYED_RELEASE_CAPSULE | Freq: Every day | ORAL | 3 refills | Status: DC

## 2017-06-02 NOTE — Telephone Encounter (Signed)
Spoke with Anelisse--she takes Prilosec b/c of GERD related to Tecfidera use.  [facility]/fim

## 2017-06-02 NOTE — Addendum Note (Signed)
Addended by: France Ravens I on: 06/02/2017 09:49 AM   Modules accepted: Orders

## 2017-06-02 NOTE — Telephone Encounter (Signed)
Pt would like to know if Dr Felecia Shelling can fill her prescription of omeprazole (PRILOSEC) 40 MG capsule to  [id number] - [place], [place] [phone] (Phone) [phone] (Fax)

## 2017-07-09 DIAGNOSIS — Z6832 Body mass index (BMI) 32.0-32.9, adult: Secondary | ICD-10-CM | POA: Diagnosis not present

## 2017-07-09 DIAGNOSIS — M545 Low back pain: Secondary | ICD-10-CM | POA: Diagnosis not present

## 2017-07-09 DIAGNOSIS — R768 Other specified abnormal immunological findings in serum: Secondary | ICD-10-CM | POA: Diagnosis not present

## 2017-07-09 DIAGNOSIS — E669 Obesity, unspecified: Secondary | ICD-10-CM | POA: Diagnosis not present

## 2017-07-09 DIAGNOSIS — M2550 Pain in unspecified joint: Secondary | ICD-10-CM | POA: Diagnosis not present

## 2017-08-12 ENCOUNTER — Ambulatory Visit (INDEPENDENT_AMBULATORY_CARE_PROVIDER_SITE_OTHER): Payer: Medicare Other | Admitting: Neurology

## 2017-08-12 ENCOUNTER — Other Ambulatory Visit: Payer: Self-pay

## 2017-08-12 ENCOUNTER — Encounter: Payer: Self-pay | Admitting: Neurology

## 2017-08-12 VITALS — BP 200/94 | HR 65 | Resp 18 | Ht 62.0 in | Wt 180.5 lb

## 2017-08-12 DIAGNOSIS — M7061 Trochanteric bursitis, right hip: Secondary | ICD-10-CM | POA: Diagnosis not present

## 2017-08-12 DIAGNOSIS — M7062 Trochanteric bursitis, left hip: Secondary | ICD-10-CM

## 2017-08-12 DIAGNOSIS — R4189 Other symptoms and signs involving cognitive functions and awareness: Secondary | ICD-10-CM | POA: Diagnosis not present

## 2017-08-12 DIAGNOSIS — G35 Multiple sclerosis: Secondary | ICD-10-CM | POA: Diagnosis not present

## 2017-08-12 DIAGNOSIS — R269 Unspecified abnormalities of gait and mobility: Secondary | ICD-10-CM

## 2017-08-12 DIAGNOSIS — R350 Frequency of micturition: Secondary | ICD-10-CM | POA: Diagnosis not present

## 2017-08-12 MED ORDER — MIRABEGRON ER 50 MG PO TB24: 50.0000 mg | ORAL_TABLET | Freq: Every day | ORAL | 5 refills | Status: DC

## 2017-08-12 NOTE — Progress Notes (Signed)
[facility]  PATIENT: Dawn [name] DOB: [birth year]  REFERRING CLINICIAN: Aura Dials HISTORY FROM: Patient REASON FOR VISIT: MS   HISTORICAL  CHIEF COMPLAINT:  Chief Complaint  Patient presents with  . Multiple Sclerosis    Sts. she feels better on [place] than she did on Aubagio. Stopped Gabapentin for pain in feet b/c it didn't help, but these pains are improved, and she attributes this to the [place].  Saw Rheumatology and was told she does not have RA.    HISTORY OF PRESENT ILLNESS:  Dawn [name] is a 72 year old woman who was diagnosed with multiple sclerosis in 2001.      Update 08/12/2017: She saw Rheumatology [facility] [place]) and they feel that she does not have RA or Sjogren's.    She switched form Aubagio to Leflunomide due to insurance issues and very high copay.    She notes that she feels better overall and copay is only $8.   However, she notes that her mouth is dryer and throat is scratchy much of the time.   She also notes mild dry eyes.   She has not seen ophthalmology in several years.      She denies new issues with gait, strength or sensation.    No change in bladder with frequency and nocturia (twice mostly).    She notes less worry and stress since Aubagio was switched to Leflunomide as she was going to have very high copays.     She feels better with less dysesthesia since switching to leflunomide   She has bilateral hip pain again.  Pain worsens with lying on her side or standing a while. In the past, trochanteric bursae injections were beneficial  Update 04/14/2017: She is on Aubagio but is just about out.   [employer] is not able to help her get Aubagio without a very high copay.  She is needing to call the "charity" daily to see if new funds are available.   We discussed trying Leflunomide which is a precursor of teriflunomide and used for rheumatoid arthritis.    I have used this for other patients with the same problems getting  medication.     For the most part, her MS is stable but she is noting more burning dysesthesia in her feet.   Aspercreme helps Tramadol was not well tolerated.   Etodolac helps the arthritic pain some.   Her elbow pain improved with an injection.    A piriformis injection only helped a week.   She has myalgias throughout her body.     She has had dark urine a few times but it only lasted one day.    Bladder function is unchanged and Vesicare helps the frequency and urgency.  .   Vision sometimes seems blurry but this fluctuates.     Fatigue bothers her daily.   Provigil helps some but incompletely.  Mood has been worse since she found out she wasn't going to be getting copay assistance for Aubagio ________________________________________________ From 02/18/2017:  Pain:   She is experiencing bilateral hip pain and more pain in her hands and feet.     We have done trochanteric bursae injections and bilateral piriformis injections seemed to help the pain for a longer time and pelvic pain is also better.  She has  A labrum tear on the right and has seen ortho.     She also has elbow pain on the right, helped x 2 months by steroid injection  at the last visit.   Tramadol and etodolac helps some but not long term.   All the joint pain worsens with use and improves with a heating pad.   She has not been tested for inflammatory arthritis.   Other joints also hurt much of the time.  MS:   She is on Aubagio and she tolerates it well. She has occasional diarrhea.   She could not tolerate Tecfidera and had tolerability issues with Plegridy and Betaseron earlier.   She is not having any definite exacerbation though she has some sensory fluctuations.   The MRI performed January 2018 showed chronic MS lesions but no acute findings.   She also appears to have had a left cerebellar CVA (also chronic)  Vision:   She notes mild blurred vision that is intermittent.    She feels both eyes are involved.   Once occurred while  driving and once while watching TV.    She did not feel symptoms were just from one eye.     Gait/strength/sensation:     She uses a cane for longer distances and if tired but walks without most of the time.  She uses it more when hips act up also.       She notes more muscle fatigue.    She has dysesthesias mostly in the left leg that did not respond well to the medications.     She denies much weakness but has muscle stiffness and pain.   Bladder:   She has urinary frequency and hesitancy.  She is on tamsulosin qod.  Vesicare helped but seemed to help her more last year that this year.    She has seen [facility].  She has a little diarrhea at times.    Fatigue/sleep:   She has fatigue that is worse in the afternoons.    She is back on modafinil with benefit. She has some difficulty with sleep onset insomnia > sleep maintenance, mostly due to pain she thinks.   Vesicare has helped sleep with less nocturia.    She has not been told she snores.    Mood/cognition:    Mood is generslly gfood, occ down with her pain.   She gets frustrated easily.  She is now doing an exercise class out in the community (does sitting down --- it a an arthritis group)..   Cognitively, she has difficulty with short-term memory as well as with verbal fluency  MS History:    In 1994, she had an episode of severe vertigo with gait ataxia. An MRI at that time was reportedly normal. The symptoms were felt to be due to allergies. About 7 years later she had vertigo, gait ataxia and diplopia. Also her left leg was giving out. Short after that she had a hysterectomy. Postoperatively, she was numb in both legs and she had a lumbar puncture which showed changes consistent with MS. Then, an MRI of the brain was performed showing changes consistent with MS. She was referred to Dr. Erling Cruz who her on Betaseron. Last year, she switched from Betaseron to Tecfidera, in the hope that she would be able to avoid shots as she was having severe  skin reactions with knots.    However, she had a lot of difficulty tolerating Tecfidera and swithced to [place] in early 2016 and to Aubagio late 2016    REVIEW OF SYSTEMS:  Constitutional: No fevers, chills, sweats, or change in appetite.   Reports fatigue Eyes: No visual changes, double vision,  eye pain Ear, nose and throat: No hearing loss, ear pain, nasal congestion, sore throat Cardiovascular: No chest pain, palpitations Respiratory:  No shortness of breath at rest or with exertion.   No wheezes GastrointestinaI: No nausea, vomiting, diarrhea, abdominal pain, fecal incontinence Genitourinary:  see above. Musculoskeletal:  Pain in right > left hip, and lower back/piriformis Integumentary: No rash, pruritus.    Neurological: as above Psychiatric: No depression at this time.  No anxiety Endocrine: No palpitations, diaphoresis, change in appetite, change in weigh or increased thirst Hematologic/Lymphatic:  No anemia, purpura, petechiae. Allergic/Immunologic: No itchy/runny eyes, nasal congestion, recent allergic reactions, rashes  ALLERGIES: No Known Allergies  HOME MEDICATIONS: Outpatient Medications Prior to Visit  Medication Sig Dispense Refill  . aspirin 81 MG tablet Take 81 mg by mouth daily.    . calcium carbonate (OS-CAL) 600 MG TABS Take 600 mg by mouth daily.    . cholecalciferol (VITAMIN D) 1000 UNITS tablet Take 1,000 Units by mouth daily.     . clonazePAM (KLONOPIN) 0.5 MG tablet TAKE 1 TABLET AT BEDTIME 30 tablet 5  . etodolac (LODINE) 400 MG tablet Take 1 tablet (400 mg total) by mouth 2 (two) times daily. 60 tablet 11  . fexofenadine (ALLEGRA) 180 MG tablet Take 180 mg by mouth daily.    [name] Kitchen leflunomide (ARAVA) 20 MG tablet Take 1 tablet (20 mg total) by mouth daily. 30 tablet 11  . MIMVEY LO 0.5-0.1 MG per tablet Take 1 tablet by mouth daily.  12  . modafinil (PROVIGIL) 200 MG tablet TAKE 1 TABLET EVERY MORNING AND ONE-HALF TABLET EVERY AFTERNOON 45 tablet 5  .  Multiple Vitamins-Minerals (MULTIVITAMIN WITH MINERALS) tablet Take 1 tablet by mouth daily.    [name] Kitchen omeprazole (PRILOSEC) 40 MG capsule Take 1 capsule (40 mg total) daily by mouth. 90 capsule 3  . traMADol (ULTRAM) 50 MG tablet Take 1 tablet (50 mg total) by mouth 3 (three) times daily as needed. 90 tablet 2  . VESICARE 5 MG tablet TAKE 1 TABLET (5 MG TOTAL) BY MOUTH DAILY. 30 tablet 11  . gabapentin (NEURONTIN) 300 MG capsule Take 1 capsule (300 mg total) by mouth 3 (three) times daily. 90 capsule 11   No facility-administered medications prior to visit.     PAST MEDICAL HISTORY: Past Medical History:  Diagnosis Date  . Allergy   . Hot flashes, menopausal 10/13/2011   Estradiol started   . Multiple sclerosis ([place])   . Neuropathy   . Vision abnormalities     PAST SURGICAL HISTORY: Past Surgical History:  Procedure Laterality Date  . ABDOMINAL HYSTERECTOMY  2002   partial  . LUMBAR FUSION      FAMILY HISTORY: Family History  Problem Relation Age of Onset  . Heart failure Mother   . Heart failure Father   . Arthritis Unknown   . Hypertension Unknown     SOCIAL HISTORY:  Social History   Socioeconomic History  . Marital status: Widowed    Spouse name: Not on file  . Number of children: Not on file  . Years of education: Not on file  . Highest education level: Not on file  Social Needs  . Financial resource strain: Not on file  . Food insecurity - worry: Not on file  . Food insecurity - inability: Not on file  . Transportation needs - medical: Not on file  . Transportation needs - non-medical: Not on file  Occupational History  . Occupation: retired  Tobacco Use  .  Smoking status: Former Smoker    Last attempt to quit: 08/05/1983    Years since quitting: 34.0  . Smokeless tobacco: Never Used  Substance and Sexual Activity  . Alcohol use: Yes    Alcohol/week: 0.0 oz    Comment: rarely  . Drug use: No  . Sexual activity: Not Currently    Birth  control/protection: Post-menopausal  Other Topics Concern  . Not on file  Social History Narrative   Regular Exercise-no   Widowed   Retired              PHYSICAL EXAM  Vitals:   08/12/17 0833  BP: (!) 200/94  Pulse: 65  Resp: 18  Weight: 180 lb 8 oz (81.9 kg)  Height: 5\' 2"  (1.575 m)    Body mass index is 33.01 kg/m.   General: The patient is well-developed and well-nourished and in no acute distress  Musculoskeletal:  She is tender over the piriformis muscles, right greater than left and over the trochanteric bursae, greater than left. She has less tenderness over the right lateral epicondyle.  Neurologic Exam  Mental status: The patient is alert and oriented x 3 at the time of the examination. The patient has apparent normal recent and remote memory, with an apparently normal attention span and concentration ability.   Speech is normal.  Cranial nerves: Extraocular movements are full.  Facial strength and sensation is normal. Trapezius strength is normal. No obvious hearing deficits are noted.  Motor:  Muscle bulk is normal but tone is symmetric, more on the left.. Strength is 4+/5 in intrinsic hand muscles   Sensory: Sensory testing is intact to touch and vibration in the arms she has reduced vibration sensation at both ankles and almost absent sensation to vibration in the toes.  Coordination: Cerebellar testing reveals reduced right finger-nose-finger and reduced heel-to-shin, left worse than right  Gait and station: Station is normal and gait is mildly wide. She has difficulty doing a tandem gait.. Romberg is negative.   Reflexes: Deep tendon reflexes are symmetric and normal bilaterally.      DIAGNOSTIC DATA (LABS, IMAGING, TESTING) - I reviewed patient records, labs, notes, testing and imaging myself where available.  Lab Results  Component Value Date   WBC 4.6 02/18/2017   HGB 13.9 02/18/2017   HCT 42.8 02/18/2017   MCV 91 02/18/2017   PLT 185  02/18/2017      Component Value Date/Time   NA 142 02/18/2016 1111   K 5.0 02/18/2016 1111   CL 101 02/18/2016 1111   CO2 24 02/18/2016 1111   GLUCOSE 91 02/18/2016 1111   GLUCOSE 82 11/03/2011 0920   BUN 20 02/18/2016 1111   CREATININE 0.88 02/18/2016 1111   CALCIUM 9.8 02/18/2016 1111   PROT 6.2 02/18/2017 0946   ALBUMIN 4.4 02/18/2017 0946   AST 18 02/18/2017 0946   ALT 14 02/18/2017 0946   ALKPHOS 113 02/18/2017 0946   BILITOT 0.3 02/18/2017 0946   GFRNONAA 68 02/18/2016 1111   GFRAA 78 02/18/2016 1111   Lab Results  Component Value Date   CHOL 203 (H) 11/03/2011   HDL 44.60 11/03/2011   LDLCALC 136 (H) 12/05/2009   LDLDIRECT 130.2 11/03/2011   TRIG 185.0 (H) 11/03/2011   CHOLHDL 5 11/03/2011   No results found for: HGBA1C No results found for: VITAMINB12 Lab Results  Component Value Date   TSH 3.80 11/03/2011       ASSESSMENT AND PLAN  MULTIPLE SCLEROSIS, PROGRESSIVE/RELAPSING - Plan: CBC  with Differential/Platelet, Comprehensive metabolic panel  Abnormality of gait  Urinary frequency  Disturbed cognition  Trochanteric bursitis of right hip  Trochanteric bursitis of left hip   1.    Continue leflunomide as a disease modifying therapy for her MS. She understands that this is off label but that the drug is very similar to Aubagio and the mechanism may be the same.  2.     Trial off of Vesicare. This may be causing her dry mouth. She will try Myrbetriq's if covered by insurance.  3.     Continue other medications.  4.    Inject both trochanteric bursae with a total of 5 mL Marcaine containing 80 mg Depo-Medrol using sterile technique. She tolerated the procedure well and there were no complications.  5.    Return to see me in about 4 months but is advised to call sooner if she has new or worsening neurologic symptoms.    Armend Hochstatter A. Felecia Shelling, MD, PhD 4/35/6861, 68:37 PM Certified in Neurology, Clinical Neurophysiology, Sleep Medicine, Pain Medicine and  Neuroimaging  [facility] [address], [place], [place] [address] [phone]-' \

## 2017-08-13 ENCOUNTER — Ambulatory Visit: Payer: Medicare Other | Admitting: Neurology

## 2017-08-13 ENCOUNTER — Telehealth: Payer: Self-pay | Admitting: *Deleted

## 2017-08-13 LAB — CBC WITH DIFFERENTIAL/PLATELET
Basophils Absolute: 0.1 10*3/uL (ref 0.0–0.2)
Basos: 1 %
EOS (ABSOLUTE): 0.2 10*3/uL (ref 0.0–0.4)
Eos: 4 %
Hematocrit: 41.5 % (ref 34.0–46.6)
Hemoglobin: 13.7 g/dL (ref 11.1–15.9)
IMMATURE GRANULOCYTES: 0 %
Immature Grans (Abs): 0 10*3/uL (ref 0.0–0.1)
LYMPHS ABS: 0.8 10*3/uL (ref 0.7–3.1)
Lymphs: 16 %
MCH: 29.7 pg (ref 26.6–33.0)
MCHC: 33 g/dL (ref 31.5–35.7)
MCV: 90 fL (ref 79–97)
MONOS ABS: 0.5 10*3/uL (ref 0.1–0.9)
Monocytes: 9 %
NEUTROS PCT: 70 %
Neutrophils Absolute: 3.5 10*3/uL (ref 1.4–7.0)
PLATELETS: 226 10*3/uL (ref 150–379)
RBC: 4.62 x10E6/uL (ref 3.77–5.28)
RDW: 14.6 % (ref 12.3–15.4)
WBC: 5.1 10*3/uL (ref 3.4–10.8)

## 2017-08-13 LAB — COMPREHENSIVE METABOLIC PANEL
A/G RATIO: 2.4 — AB (ref 1.2–2.2)
ALK PHOS: 131 IU/L — AB (ref 39–117)
ALT: 26 IU/L (ref 0–32)
AST: 21 IU/L (ref 0–40)
Albumin: 4.6 g/dL (ref 3.6–4.8)
BUN/Creatinine Ratio: 20 (ref 12–28)
BUN: 14 mg/dL (ref 8–27)
Bilirubin Total: 0.4 mg/dL (ref 0.0–1.2)
CALCIUM: 9.8 mg/dL (ref 8.7–10.3)
CO2: 23 mmol/L (ref 20–29)
Chloride: 104 mmol/L (ref 96–106)
Creatinine, Ser: 0.69 mg/dL (ref 0.57–1.00)
GFR calc Af Amer: 103 mL/min/{1.73_m2} (ref >59)
GFR calc non Af Amer: 89 mL/min/{1.73_m2} (ref >59)
Globulin, Total: 1.9 g/dL (ref 1.5–4.5)
Glucose: 86 mg/dL (ref 65–99)
POTASSIUM: 4.5 mmol/L (ref 3.5–5.2)
SODIUM: 144 mmol/L (ref 134–144)
Total Protein: 6.5 g/dL (ref 6.0–8.5)

## 2017-08-13 NOTE — Telephone Encounter (Signed)
-----   Message from Britt Bottom, MD sent at 08/13/2017  9:13 AM EST ----- Please let the patient know that the lab work is fine.

## 2017-08-13 NOTE — Telephone Encounter (Signed)
Spoke with Dawn [name] and per [name], advised lab work done in our office is ok.  She verbalized understanding of same/fim

## 2017-08-14 ENCOUNTER — Ambulatory Visit: Payer: Medicare Other | Admitting: Neurology

## 2017-09-07 ENCOUNTER — Telehealth: Payer: Self-pay | Admitting: *Deleted

## 2017-09-07 NOTE — Telephone Encounter (Signed)
PA for Modafinil 200mg  tablets #45/30 completed via Cover My Meds.  Dx; Fatigue related to MS (R53.83, G35)/fim

## 2017-09-07 NOTE — Telephone Encounter (Signed)
Request Reference Number: [id number]. MODAFINIL TAB 200MG  is approved through 03/07/2018. For further questions, call [id number].

## 2017-09-30 DIAGNOSIS — Z124 Encounter for screening for malignant neoplasm of cervix: Secondary | ICD-10-CM | POA: Diagnosis not present

## 2017-10-19 ENCOUNTER — Telehealth: Payer: Self-pay | Admitting: Neurology

## 2017-10-19 NOTE — Telephone Encounter (Signed)
Pt called thinking she is having an allergic reaction to mirabegron ER (MYRBETRIQ) 50 MG TB24 tablet stating it started about a week and a half ago with an itch at and around her ankles. Then slowly progressed to a rash throughout her lower legs and ankles. Also is causing her face to swell. Please call to advise

## 2017-10-19 NOTE — Telephone Encounter (Signed)
Spoke with [employer].  She started Myrbetriq in January.  Just began having rash to ankles, legs, and facial edema about 10 days ago.  Has now stopped Myrbetriq--didn't feel it helped much anyway--and taking Allegra every night for seasonal allergies, applying cold packs to face. Sts. facial swelling is going down. Denies any resp. distress and I have advised her to seek immed. tx if facial edema starts to get worse, or if she has diff. breathing. She verbalized understanding of same, but sts. she feels it is getting better.  No audible resp. distress noted on phone--no wheezing, sore throat.  She requests appt. for bilat hip inj.  Appt. given 10/20/17/fim

## 2017-10-20 ENCOUNTER — Ambulatory Visit (INDEPENDENT_AMBULATORY_CARE_PROVIDER_SITE_OTHER): Payer: Medicare Other | Admitting: Neurology

## 2017-10-20 ENCOUNTER — Encounter: Payer: Self-pay | Admitting: Neurology

## 2017-10-20 ENCOUNTER — Encounter (INDEPENDENT_AMBULATORY_CARE_PROVIDER_SITE_OTHER): Payer: Self-pay

## 2017-10-20 ENCOUNTER — Other Ambulatory Visit: Payer: Self-pay

## 2017-10-20 VITALS — BP 153/80 | HR 81 | Resp 16 | Ht 62.0 in | Wt 180.0 lb

## 2017-10-20 DIAGNOSIS — R269 Unspecified abnormalities of gait and mobility: Secondary | ICD-10-CM | POA: Diagnosis not present

## 2017-10-20 DIAGNOSIS — M7061 Trochanteric bursitis, right hip: Secondary | ICD-10-CM | POA: Diagnosis not present

## 2017-10-20 DIAGNOSIS — G35 Multiple sclerosis: Secondary | ICD-10-CM | POA: Diagnosis not present

## 2017-10-20 DIAGNOSIS — M7062 Trochanteric bursitis, left hip: Secondary | ICD-10-CM

## 2017-10-20 NOTE — Progress Notes (Signed)
[facility]  PATIENT: Dawn [name] DOB: 1945-10-19  REFERRING CLINICIAN: Aura Dials HISTORY FROM: Patient REASON FOR VISIT: MS   HISTORICAL  CHIEF COMPLAINT:  Chief Complaint  Patient presents with  . Multiple Sclerosis    Continues to tolerate Aravaq well.  Here today with c/o bilat hip pain; requesting inj. if appropriate/fim    HISTORY OF PRESENT ILLNESS:  Dawn [name] is a 72 year old woman who was diagnosed with multiple sclerosis in 2001.      Update 10/20/2017: She is reporting more pain in the hips, left > right.  Pain is worse when she is laying on her sides or if she stands or walks a while.  She received benefit from trochanteric bursae injections in the past.   We discussed her seeing Ortho.     The right elbow pain is much batter after a shot in the past.    Her MS is doing well.  She is on leflunomide (she had issues getting patient assistance for Aubagio).  She is tolerating it well.  She has not had any exacerbations since starting.  She feels her MS is stable.  She notes no new issues with her gait, strength or sensation.  Balance is still off at times.  She has some urinary frequency and urgency but no incontinence.   She stopped Myrbetriq and went back to [place] because she had a mild rash.    Update 08/12/2017: She saw Rheumatology [facility] [place]) and they feel that she does not have RA or Sjogren's.    She switched form Aubagio to Leflunomide due to insurance issues and very high copay.    She notes that she feels better overall and copay is only $8.   However, she notes that her mouth is dryer and throat is scratchy much of the time.   She also notes mild dry eyes.   She has not seen ophthalmology in several years.      She denies new issues with gait, strength or sensation.    No change in bladder with frequency and nocturia (twice mostly).    She notes less worry and stress since Aubagio was switched to Leflunomide as she was going  to have very high copays.     She feels better with less dysesthesia since switching to leflunomide   She has bilateral hip pain again.  Pain worsens with lying on her side or standing a while. In the past, trochanteric bursae injections were beneficial  Update 04/14/2017: She is on Aubagio but is just about out.   [employer] is not able to help her get Aubagio without a very high copay.  She is needing to call the "charity" daily to see if new funds are available.   We discussed trying Leflunomide which is a precursor of teriflunomide and used for rheumatoid arthritis.    I have used this for other patients with the same problems getting medication.     For the most part, her MS is stable but she is noting more burning dysesthesia in her feet.   Aspercreme helps Tramadol was not well tolerated.   Etodolac helps the arthritic pain some.   Her elbow pain improved with an injection.    A piriformis injection only helped a week.   She has myalgias throughout her body.     She has had dark urine a few times but it only lasted one day.    Bladder function is unchanged and Vesicare helps  the frequency and urgency.  .   Vision sometimes seems blurry but this fluctuates.     Fatigue bothers her daily.   Provigil helps some but incompletely.  Mood has been worse since she found out she wasn't going to be getting copay assistance for Aubagio ________________________________________________ From 02/18/2017:  Pain:   She is experiencing bilateral hip pain and more pain in her hands and feet.     We have done trochanteric bursae injections and bilateral piriformis injections seemed to help the pain for a longer time and pelvic pain is also better.  She has  A labrum tear on the right and has seen ortho.     She also has elbow pain on the right, helped x 2 months by steroid injection at the last visit.   Tramadol and etodolac helps some but not long term.   All the joint pain worsens with use and improves with a  heating pad.   She has not been tested for inflammatory arthritis.   Other joints also hurt much of the time.  MS:   She is on Aubagio and she tolerates it well. She has occasional diarrhea.   She could not tolerate Tecfidera and had tolerability issues with Plegridy and Betaseron earlier.   She is not having any definite exacerbation though she has some sensory fluctuations.   The MRI performed January 2018 showed chronic MS lesions but no acute findings.   She also appears to have had a left cerebellar CVA (also chronic)  Vision:   She notes mild blurred vision that is intermittent.    She feels both eyes are involved.   Once occurred while driving and once while watching TV.    She did not feel symptoms were just from one eye.     Gait/strength/sensation:     She uses a cane for longer distances and if tired but walks without most of the time.  She uses it more when hips act up also.       She notes more muscle fatigue.    She has dysesthesias mostly in the left leg that did not respond well to the medications.     She denies much weakness but has muscle stiffness and pain.   Bladder:   She has urinary frequency and hesitancy.  She is on tamsulosin qod.  Vesicare helped but seemed to help her more last year that this year.    She has seen [facility].  She has a little diarrhea at times.    Fatigue/sleep:   She has fatigue that is worse in the afternoons.    She is back on modafinil with benefit. She has some difficulty with sleep onset insomnia > sleep maintenance, mostly due to pain she thinks.   Vesicare has helped sleep with less nocturia.    She has not been told she snores.    Mood/cognition:    Mood is generslly gfood, occ down with her pain.   She gets frustrated easily.  She is now doing an exercise class out in the community (does sitting down --- it a an arthritis group)..   Cognitively, she has difficulty with short-term memory as well as with verbal fluency  MS History:    In 1994,  she had an episode of severe vertigo with gait ataxia. An MRI at that time was reportedly normal. The symptoms were felt to be due to allergies. About 7 years later she had vertigo, gait ataxia and diplopia. Also her  left leg was giving out. Short after that she had a hysterectomy. Postoperatively, she was numb in both legs and she had a lumbar puncture which showed changes consistent with MS. Then, an MRI of the brain was performed showing changes consistent with MS. She was referred to Dr. Erling Cruz who her on Betaseron. Last year, she switched from Betaseron to Tecfidera, in the hope that she would be able to avoid shots as she was having severe skin reactions with knots.    However, she had a lot of difficulty tolerating Tecfidera and swithced to [place] in early 2016 and to Aubagio late 2016    REVIEW OF SYSTEMS:  Constitutional: No fevers, chills, sweats, or change in appetite.   Reports fatigue Eyes: No visual changes, double vision, eye pain Ear, nose and throat: No hearing loss, ear pain, nasal congestion, sore throat Cardiovascular: No chest pain, palpitations Respiratory:  No shortness of breath at rest or with exertion.   No wheezes GastrointestinaI: No nausea, vomiting, diarrhea, abdominal pain, fecal incontinence Genitourinary:  see above. Musculoskeletal:  Pain in left greater than right hip Integumentary: No rash, pruritus.    Neurological: as above Psychiatric: No depression at this time.  No anxiety Endocrine: No palpitations, diaphoresis, change in appetite, change in weigh or increased thirst Hematologic/Lymphatic:  No anemia, purpura, petechiae. Allergic/Immunologic: No itchy/runny eyes, nasal congestion, recent allergic reactions, rashes  ALLERGIES: No Known Allergies  HOME MEDICATIONS: Outpatient Medications Prior to Visit  Medication Sig Dispense Refill  . aspirin 81 MG tablet Take 81 mg by mouth daily.    . calcium carbonate (OS-CAL) 600 MG TABS Take 600 mg by mouth  daily.    . cholecalciferol (VITAMIN D) 1000 UNITS tablet Take 1,000 Units by mouth daily.     . clonazePAM (KLONOPIN) 0.5 MG tablet TAKE 1 TABLET AT BEDTIME 30 tablet 5  . etodolac (LODINE) 400 MG tablet Take 1 tablet (400 mg total) by mouth 2 (two) times daily. 60 tablet 11  . fexofenadine (ALLEGRA) 180 MG tablet Take 180 mg by mouth daily.    [name] Kitchen gabapentin (NEURONTIN) 300 MG capsule Take 1 capsule (300 mg total) by mouth 3 (three) times daily. 90 capsule 11  . leflunomide (ARAVA) 20 MG tablet Take 1 tablet (20 mg total) by mouth daily. 30 tablet 11  . MIMVEY LO 0.5-0.1 MG per tablet Take 1 tablet by mouth daily.  12  . mirabegron ER (MYRBETRIQ) 50 MG TB24 tablet Take 1 tablet (50 mg total) by mouth daily. 30 tablet 5  . modafinil (PROVIGIL) 200 MG tablet TAKE 1 TABLET EVERY MORNING AND ONE-HALF TABLET EVERY AFTERNOON 45 tablet 5  . Multiple Vitamins-Minerals (MULTIVITAMIN WITH MINERALS) tablet Take 1 tablet by mouth daily.    [name] Kitchen omeprazole (PRILOSEC) 40 MG capsule Take 1 capsule (40 mg total) daily by mouth. 90 capsule 3  . traMADol (ULTRAM) 50 MG tablet Take 1 tablet (50 mg total) by mouth 3 (three) times daily as needed. 90 tablet 2  . VESICARE 5 MG tablet TAKE 1 TABLET (5 MG TOTAL) BY MOUTH DAILY. 30 tablet 11   No facility-administered medications prior to visit.     PAST MEDICAL HISTORY: Past Medical History:  Diagnosis Date  . Allergy   . Hot flashes, menopausal 10/13/2011   Estradiol started   . Multiple sclerosis ([place])   . Neuropathy   . Vision abnormalities     PAST SURGICAL HISTORY: Past Surgical History:  Procedure Laterality Date  . ABDOMINAL HYSTERECTOMY  2002  partial  . LUMBAR FUSION      FAMILY HISTORY: Family History  Problem Relation Age of Onset  . Heart failure Mother   . Heart failure Father   . Arthritis Unknown   . Hypertension Unknown     SOCIAL HISTORY:  Social History   Socioeconomic History  . Marital status: Widowed    Spouse name: Not  on file  . Number of children: Not on file  . Years of education: Not on file  . Highest education level: Not on file  Occupational History  . Occupation: retired  Scientific laboratory technician  . Financial resource strain: Not on file  . Food insecurity:    Worry: Not on file    Inability: Not on file  . Transportation needs:    Medical: Not on file    Non-medical: Not on file  Tobacco Use  . Smoking status: Former Smoker    Last attempt to quit: 08/05/1983    Years since quitting: 34.2  . Smokeless tobacco: Never Used  Substance and Sexual Activity  . Alcohol use: Yes    Alcohol/week: 0.0 oz    Comment: rarely  . Drug use: No  . Sexual activity: Not Currently    Birth control/protection: Post-menopausal  Lifestyle  . Physical activity:    Days per week: Not on file    Minutes per session: Not on file  . Stress: Not on file  Relationships  . Social connections:    Talks on phone: Not on file    Gets together: Not on file    Attends religious service: Not on file    Active member of club or organization: Not on file    Attends meetings of clubs or organizations: Not on file    Relationship status: Not on file  . Intimate partner violence:    Fear of current or ex partner: Not on file    Emotionally abused: Not on file    Physically abused: Not on file    Forced sexual activity: Not on file  Other Topics Concern  . Not on file  Social History Narrative   Regular Exercise-no   Widowed   Retired              PHYSICAL EXAM  Vitals:   10/20/17 1515  BP: (!) 153/80  Pulse: 81  Resp: 16  Weight: 180 lb (81.6 kg)  Height: 5\' 2"  (1.575 m)    Body mass index is 32.92 kg/m.   General: The patient is well-developed and well-nourished and in no acute distress  Musculoskeletal: There is tenderness over the trochanteric bursae, left more than right.  She has mild tenderness over the piriformis muscles.  Neurologic Exam  Mental status: The patient is alert and oriented x 3  at the time of the examination. The patient has apparent normal recent and remote memory, with an apparently normal attention span and concentration ability.   Speech is normal.  Cranial nerves: Extraocular movements are full.  Facial strength and sensation are normal.  The trapezius strength is normal.  Motor:  Muscle bulk is normal but tone is symmetric, more on the left.. Strength is 4+/5 in intrinsic hand muscles   Sensory: Sensory testing is intact to touch and vibration in the arms she has reduced vibration sensation at both ankles and almost absent sensation to vibration in the toes.  Coordination: Cerebellar testing reveals reduced right finger-nose-finger and reduced heel-to-shin, left worse than right  Gait and station: Station is normal.  The gait is mildly wide.  The tandem gait is moderately wide.   Romberg is negative.   Reflexes: Deep tendon reflexes are symmetric and normal bilaterally.      DIAGNOSTIC DATA (LABS, IMAGING, TESTING) - I reviewed patient records, labs, notes, testing and imaging myself where available.  Lab Results  Component Value Date   WBC 5.1 08/12/2017   HGB 13.7 08/12/2017   HCT 41.5 08/12/2017   MCV 90 08/12/2017   PLT 226 08/12/2017      Component Value Date/Time   NA 144 08/12/2017 0917   K 4.5 08/12/2017 0917   CL 104 08/12/2017 0917   CO2 23 08/12/2017 0917   GLUCOSE 86 08/12/2017 0917   GLUCOSE 82 11/03/2011 0920   BUN 14 08/12/2017 0917   CREATININE 0.69 08/12/2017 0917   CALCIUM 9.8 08/12/2017 0917   PROT 6.5 08/12/2017 0917   ALBUMIN 4.6 08/12/2017 0917   AST 21 08/12/2017 0917   ALT 26 08/12/2017 0917   ALKPHOS 131 (H) 08/12/2017 0917   BILITOT 0.4 08/12/2017 0917   GFRNONAA 89 08/12/2017 0917   GFRAA 103 08/12/2017 0917   Lab Results  Component Value Date   CHOL 203 (H) 11/03/2011   HDL 44.60 11/03/2011   LDLCALC 136 (H) 12/05/2009   LDLDIRECT 130.2 11/03/2011   TRIG 185.0 (H) 11/03/2011   CHOLHDL 5 11/03/2011    No results found for: HGBA1C No results found for: VITAMINB12 Lab Results  Component Value Date   TSH 3.80 11/03/2011       ASSESSMENT AND PLAN  MULTIPLE SCLEROSIS, PROGRESSIVE/RELAPSING  Trochanteric bursitis of right hip - Plan: Ambulatory referral to Orthopedic Surgery  Trochanteric bursitis of left hip - Plan: Ambulatory referral to Orthopedic Surgery  Abnormality of gait   1.    Continue leflunomide for MS. 2.      Inject both trochanteric bursae with a total of 5 mL Marcaine containing 80 mg Depo-Medrol using sterile technique. She tolerated the procedure well and there were no complications.  3.    Since she has had recurrent hip pain I recommended to her that she see orthopedics and a consult was requested. 4.      Return to see me in about 4 months but is advised to call sooner if she has new or worsening neurologic symptoms.    Silverio Hagan A. Felecia Shelling, MD, PhD 09/25/9022, 0:97 PM Certified in Neurology, Clinical Neurophysiology, Sleep Medicine, Pain Medicine and Neuroimaging  [facility] [address], [place], [place] [address] [phone]-' \

## 2017-10-27 ENCOUNTER — Ambulatory Visit (INDEPENDENT_AMBULATORY_CARE_PROVIDER_SITE_OTHER): Payer: Medicare Other

## 2017-10-27 ENCOUNTER — Ambulatory Visit (INDEPENDENT_AMBULATORY_CARE_PROVIDER_SITE_OTHER): Payer: Medicare Other | Admitting: Orthopaedic Surgery

## 2017-10-27 ENCOUNTER — Encounter (INDEPENDENT_AMBULATORY_CARE_PROVIDER_SITE_OTHER): Payer: Self-pay | Admitting: Orthopaedic Surgery

## 2017-10-27 VITALS — BP 151/71 | HR 65 | Resp 16 | Ht 62.0 in | Wt 178.0 lb

## 2017-10-27 DIAGNOSIS — G8929 Other chronic pain: Secondary | ICD-10-CM

## 2017-10-27 DIAGNOSIS — M545 Low back pain: Secondary | ICD-10-CM | POA: Diagnosis not present

## 2017-10-27 DIAGNOSIS — M25551 Pain in right hip: Secondary | ICD-10-CM

## 2017-10-27 DIAGNOSIS — M25552 Pain in left hip: Secondary | ICD-10-CM

## 2017-10-27 NOTE — Progress Notes (Signed)
Office Visit Note   Patient: Dawn [name]           Date of Birth: [birth year]           MRN: [id number] Visit Date: 10/27/2017              Requested by: Britt Bottom, MD [address] [place], [place] [address] PCP: Patient, No Pcp Per   Assessment & Plan: Visit Diagnoses:  1. Chronic bilateral low back pain without sciatica   2. Bilateral hip pain     Plan: Chronic history of bilateral hip pain.  Has evidence of osteoarthritis of her right hip by plain film.  Also has chronic history of back pain.  I think her discomfort in both hips may be a combination of back and hip etiology.  Will start with an MRI scan of her lumbar spine and have her return thereafter.  Long discussion regarding her.possible diagnosis, treatment options and will return after the MRI scan  Follow-Up Instructions: Return after MRI L-S spine.   Orders:  Orders Placed This Encounter  Procedures  . XR Lumbar Spine 2-3 Views  . XR Pelvis 1-2 Views  . MR Lumbar Spine w/o contrast   No orders of the defined types were placed in this encounter.     Procedures: No procedures performed   Clinical Data: No additional findings.   Subjective: Chief Complaint  Patient presents with  . Left Hip - Pain  . Right Hip - Pain  . Hip Pain    Bil hip pain x 6 years, worsening x 3 years, inj. Dr. Felecia Shelling last week, no injury, no surgery to hips, labral tear x 3 years right hip, difficulty walking, difficulty sleeping at night, not diabetic, patient has MS, IBU helps some, no surgery to hips  Dawn [name] has a chronic history of bilateral lateral hip pain.  She has had a number of cortisone injections in the past with some temporary relief of her pain.  She also has a chronic history of back pain with prior surgery many years ago.  She is experiencing back pain ,bilateral hip discomfort and some numbness and tingling into her fingers and toes.  The above is consistent with her diagnosis of multiple sclerosis  being treated by her neurologist.  She does not appear to be experiencing claudication.  She has tried a number of over-the-counter medicines with some relief.  She is also on a medicine for her multiple sclerosis  HPI  Review of Systems  Constitutional: Positive for activity change and fatigue.  HENT: Positive for trouble swallowing.   Eyes: Negative for pain.  Respiratory: Negative for chest tightness.   Cardiovascular: Negative for leg swelling.  Gastrointestinal: Positive for constipation.  Endocrine: Positive for cold intolerance and heat intolerance.  Genitourinary: Negative for difficulty urinating.  Musculoskeletal: Positive for back pain, gait problem and joint swelling.  Skin: Positive for rash.  Allergic/Immunologic: Negative for food allergies.  Neurological: Positive for weakness and numbness.  Hematological: Does not bruise/bleed easily.  Psychiatric/Behavioral: Positive for sleep disturbance.     Objective: Vital Signs: BP (!) 151/71 (BP Location: Left Arm, Patient Position: Sitting, Cuff Size: Normal)   Pulse 65   Resp 16   Ht 5\' 2"  (1.575 m)   Wt 178 lb (80.7 kg)   BMI 32.56 kg/m   Physical Exam  Ortho Exam awake alert and oriented x3.  Comfortable sitting.  Pleasant.  Definite difference in the right versus left hip exam with  limited range of motion and pain in the groin and anterior thigh.  No swelling distally.  Appears to have good strength.  Straight leg raise negative.  No percussible tenderness of the lumbar spine or sacroiliac joints pain over the lateral aspect of either hip.  Specialty Comments:  No specialty comments available.  Imaging: No results found.   PMFS History: Patient Active Problem List   Diagnosis Date Noted  . Hand pain 02/20/2017  . Multiple joint pain 02/18/2017  . Right elbow pain 12/09/2016  . Disturbed cognition 06/25/2016  . Trochanteric bursitis of left hip 02/18/2016  . Sciatica, right side 10/30/2015  . Bilateral  arm pain 10/10/2015  . Trochanteric bursitis of right hip 08/04/2014  . Adjustment disorder with mixed anxiety and depressed mood 08/04/2014  . Chronic fatigue 08/04/2014  . Urinary frequency 08/04/2014  . Abnormality of gait 08/04/2014  . Unspecified visual disturbance 01/31/2013  . Routine general medical examination at a health care facility 11/04/2011  . Colon polyps 11/03/2011  . Hot flashes, menopausal 10/13/2011  . POSTHERPETIC NEURALGIA 10/03/2009  . DISPLCMT LUMBAR INTERVERT [place] W/O MYELOPATHY 09/05/2009  . RENAL CALCULUS, RECURRENT 09/04/2009  . HYPERLIPIDEMIA 08/31/2009  . MULTIPLE SCLEROSIS, PROGRESSIVE/RELAPSING 08/31/2009  . ALLERGIC RHINITIS 08/31/2009   Past Medical History:  Diagnosis Date  . Allergy   . Hot flashes, menopausal 10/13/2011   Estradiol started   . Multiple sclerosis ([place])   . Neuropathy   . Osteoarthritis   . Vision abnormalities     Family History  Problem Relation Age of Onset  . Heart failure Mother   . Heart failure Father   . Arthritis Unknown   . Hypertension Unknown     Past Surgical History:  Procedure Laterality Date  . ABDOMINAL HYSTERECTOMY  2002   partial  . LUMBAR FUSION     Social History   Occupational History  . Occupation: retired  Tobacco Use  . Smoking status: Former Smoker    Packs/day: 0.50    Years: 15.00    Pack years: 7.50    Types: Cigarettes    Last attempt to quit: 08/05/1983    Years since quitting: 34.2  . Smokeless tobacco: Never Used  Substance and Sexual Activity  . Alcohol use: Yes    Alcohol/week: 0.0 oz    Comment: rarely  . Drug use: No  . Sexual activity: Not Currently    Birth control/protection: Post-menopausal

## 2017-10-29 ENCOUNTER — Other Ambulatory Visit (INDEPENDENT_AMBULATORY_CARE_PROVIDER_SITE_OTHER): Payer: Self-pay | Admitting: Radiology

## 2017-10-29 ENCOUNTER — Telehealth (INDEPENDENT_AMBULATORY_CARE_PROVIDER_SITE_OTHER): Payer: Self-pay | Admitting: Orthopaedic Surgery

## 2017-10-29 NOTE — Telephone Encounter (Signed)
Patient left a voicemail stating that she would like to get her MRI at [facility].

## 2017-10-29 NOTE — Telephone Encounter (Signed)
Please advise 

## 2017-10-29 NOTE — Telephone Encounter (Signed)
OK for MRI L-S spine at [facility] imaging

## 2017-10-29 NOTE — Telephone Encounter (Signed)
Mri l spine was placed by dr Durward Fortes

## 2017-11-01 ENCOUNTER — Other Ambulatory Visit: Payer: Self-pay | Admitting: Neurology

## 2017-11-09 DIAGNOSIS — M519 Unspecified thoracic, thoracolumbar and lumbosacral intervertebral disc disorder: Secondary | ICD-10-CM | POA: Diagnosis not present

## 2017-11-09 DIAGNOSIS — M4727 Other spondylosis with radiculopathy, lumbosacral region: Secondary | ICD-10-CM | POA: Diagnosis not present

## 2017-11-09 DIAGNOSIS — M4726 Other spondylosis with radiculopathy, lumbar region: Secondary | ICD-10-CM | POA: Diagnosis not present

## 2017-11-09 DIAGNOSIS — M899 Disorder of bone, unspecified: Secondary | ICD-10-CM | POA: Diagnosis not present

## 2017-11-13 ENCOUNTER — Other Ambulatory Visit (INDEPENDENT_AMBULATORY_CARE_PROVIDER_SITE_OTHER): Payer: Self-pay | Admitting: Radiology

## 2017-11-13 ENCOUNTER — Encounter (INDEPENDENT_AMBULATORY_CARE_PROVIDER_SITE_OTHER): Payer: Self-pay | Admitting: Orthopaedic Surgery

## 2017-11-13 ENCOUNTER — Ambulatory Visit (INDEPENDENT_AMBULATORY_CARE_PROVIDER_SITE_OTHER): Payer: Medicare Other | Admitting: Orthopaedic Surgery

## 2017-11-13 VITALS — BP 168/79 | HR 66 | Resp 18 | Ht 62.0 in | Wt 178.0 lb

## 2017-11-13 DIAGNOSIS — M2550 Pain in unspecified joint: Secondary | ICD-10-CM

## 2017-11-13 DIAGNOSIS — G8929 Other chronic pain: Secondary | ICD-10-CM

## 2017-11-13 DIAGNOSIS — M545 Low back pain: Principal | ICD-10-CM

## 2017-11-13 NOTE — Progress Notes (Signed)
Office Visit Note   Patient: Dawn [name]           Date of Birth: 1946-04-25           MRN: [id number] Visit Date: 11/13/2017              Requested by: No referring provider defined for this encounter. PCP: Britt Bottom, MD   Assessment & Plan: Visit Diagnoses:  1. Pain in joint, multiple sites   2. Chronic bilateral low back pain without sciatica     Plan: MRI scan performed through [place] demonstrates the level lumbar disc degenerative change of facet arthrosis.  There is mild canal stenosis at L2-3 and L3-4.  There is moderate right foraminal stenosis at L3-4 with otherwise mild foraminal stenosis.  There is a lesion of the L1 vertebral body that appears to be a hemangioma but other etiologies could not be excluded including myeloma.  Discussed the above with Dawn [name] and will obtain a serum protein electrophoresis.  We will also try a course of physical therapy and then reevaluate over the next 4 to 6 weeks.  I would consider epidural steroid injection if no improvement.  Follow-Up Instructions: Return if symptoms worsen or fail to improve.   Orders:  Orders Placed This Encounter  Procedures  . Serum protein electrophoresis with reflex   No orders of the defined types were placed in this encounter.     Procedures: No procedures performed   Clinical Data: No additional findings.   Subjective: Chief Complaint  Patient presents with  . Follow-up    MRI REV  Dawn [name] is here for follow-up evaluation after an MRI scan of the lumbar spine.  She has been experiencing back pain and what appears to be some claudication.  When she stands or walks for any length of time she will have some discomfort in her back or buttock in both of her legs.  She does have a history of MS.  MRI scan results are as above  HPI  Review of Systems  Constitutional: Positive for fatigue.  HENT: Negative for ear pain.   Eyes: Negative for pain.  Respiratory: Negative for cough  and shortness of breath.   Cardiovascular: Negative for leg swelling.  Gastrointestinal: Positive for constipation. Negative for diarrhea.  Genitourinary: Negative for difficulty urinating.  Musculoskeletal: Positive for back pain and neck pain.  Skin: Negative for rash.  Allergic/Immunologic: Negative for food allergies.  Neurological: Positive for weakness and numbness.  Hematological: Does not bruise/bleed easily.  Psychiatric/Behavioral: Positive for sleep disturbance.     Objective: Vital Signs: BP (!) 168/79 (BP Location: Left Arm, Patient Position: Sitting, Cuff Size: Normal)   Pulse 66   Resp 18   Ht 5\' 2"  (1.575 m)   Wt 178 lb (80.7 kg)   BMI 32.56 kg/m   Physical Exam  Constitutional: She is oriented to person, place, and time. She appears well-developed and well-nourished.  Eyes: Pupils are equal, round, and reactive to light. EOM are normal.  Pulmonary/Chest: Effort normal.  Neurological: She is alert and oriented to person, place, and time.  Skin: Skin is warm and dry.  Psychiatric: She has a normal mood and affect. Her behavior is normal.    Ortho Exam awake alert and oriented x3 comfortable sitting.  Straight leg raise negative bilaterally.  No significant percussible tenderness of the lumbar spine.  Good capillary refill to toes.  No hip or knee pain.  Specialty Comments:  No specialty  comments available.  Imaging: No results found.   PMFS History: Patient Active Problem List   Diagnosis Date Noted  . Hand pain 02/20/2017  . Multiple joint pain 02/18/2017  . Right elbow pain 12/09/2016  . Disturbed cognition 06/25/2016  . Trochanteric bursitis of left hip 02/18/2016  . Sciatica, right side 10/30/2015  . Bilateral arm pain 10/10/2015  . Trochanteric bursitis of right hip 08/04/2014  . Adjustment disorder with mixed anxiety and depressed mood 08/04/2014  . Chronic fatigue 08/04/2014  . Urinary frequency 08/04/2014  . Abnormality of gait 08/04/2014    . Unspecified visual disturbance 01/31/2013  . Routine general medical examination at a health care facility 11/04/2011  . Colon polyps 11/03/2011  . Hot flashes, menopausal 10/13/2011  . POSTHERPETIC NEURALGIA 10/03/2009  . DISPLCMT LUMBAR INTERVERT [place] W/O MYELOPATHY 09/05/2009  . RENAL CALCULUS, RECURRENT 09/04/2009  . HYPERLIPIDEMIA 08/31/2009  . MULTIPLE SCLEROSIS, PROGRESSIVE/RELAPSING 08/31/2009  . ALLERGIC RHINITIS 08/31/2009   Past Medical History:  Diagnosis Date  . Allergy   . Hot flashes, menopausal 10/13/2011   Estradiol started   . Multiple sclerosis ([place])   . Neuropathy   . Osteoarthritis   . Vision abnormalities     Family History  Problem Relation Age of Onset  . Heart failure Mother   . Heart failure Father   . Arthritis Unknown   . Hypertension Unknown     Past Surgical History:  Procedure Laterality Date  . ABDOMINAL HYSTERECTOMY  2002   partial  . LUMBAR FUSION     Social History   Occupational History  . Occupation: retired  Tobacco Use  . Smoking status: Former Smoker    Packs/day: 0.50    Years: 15.00    Pack years: 7.50    Types: Cigarettes    Last attempt to quit: 08/05/1983    Years since quitting: 34.2  . Smokeless tobacco: Never Used  Substance and Sexual Activity  . Alcohol use: Yes    Alcohol/week: 0.0 oz    Comment: rarely  . Drug use: No  . Sexual activity: Not Currently    Birth control/protection: Post-menopausal

## 2017-11-16 LAB — PROTEIN ELECTROPHORESIS, SERUM, WITH REFLEX
ALBUMIN ELP: 4.2 g/dL (ref 3.8–4.8)
ALPHA 1: 0.2 g/dL (ref 0.2–0.3)
Alpha 2: 0.7 g/dL (ref 0.5–0.9)
BETA 2: 0.3 g/dL (ref 0.2–0.5)
BETA GLOBULIN: 0.5 g/dL (ref 0.4–0.6)
Gamma Globulin: 0.5 g/dL — ABNORMAL LOW (ref 0.8–1.7)
TOTAL PROTEIN: 6.3 g/dL (ref 6.1–8.1)

## 2017-11-18 LAB — IFE INTERPRETATION

## 2017-11-19 ENCOUNTER — Other Ambulatory Visit: Payer: Self-pay | Admitting: Radiology

## 2017-11-19 DIAGNOSIS — M2550 Pain in unspecified joint: Secondary | ICD-10-CM

## 2017-11-19 NOTE — Progress Notes (Signed)
SENT REFERRAL IN 

## 2017-11-19 NOTE — Progress Notes (Signed)
Please make referral to hematology with abnormal IFE

## 2017-11-23 ENCOUNTER — Telehealth: Payer: Self-pay | Admitting: Neurology

## 2017-11-23 NOTE — Telephone Encounter (Signed)
Spoke with [employer].  She wanted to let us know that she had an MRI at Dr. Charlett Lango office, and due to a lesion (per report, likely a hemangioma, but can't r/o other causes, such as myeloma), and abnormal lab work, she has been referred to hematology.  I will pass this update along to Dr. Arlean Hopping

## 2017-11-23 NOTE — Telephone Encounter (Signed)
Pt called she has test results from Dr Durward Fortes MRI lumbar. She is wanting to discuss referral to hemotologist and Dr Felecia Shelling thoughts on that. Please call to advise

## 2017-11-25 ENCOUNTER — Telehealth (INDEPENDENT_AMBULATORY_CARE_PROVIDER_SITE_OTHER): Payer: Self-pay | Admitting: Orthopaedic Surgery

## 2017-11-25 NOTE — Telephone Encounter (Signed)
Patient called stating that she had an appointment on 11/13/17 and was being referred to a hematologist.  Patient states that she received a call from [facility] to schedule an appointment.  Patient states she would like to see someone in [place] if possible.

## 2017-11-27 ENCOUNTER — Other Ambulatory Visit (INDEPENDENT_AMBULATORY_CARE_PROVIDER_SITE_OTHER): Payer: Self-pay | Admitting: Radiology

## 2017-11-27 NOTE — Telephone Encounter (Signed)
Changed in referrral

## 2017-12-02 ENCOUNTER — Ambulatory Visit: Payer: Medicare Other | Attending: Orthopaedic Surgery | Admitting: Physical Therapy

## 2017-12-02 ENCOUNTER — Other Ambulatory Visit: Payer: Self-pay

## 2017-12-02 ENCOUNTER — Encounter: Payer: Self-pay | Admitting: Physical Therapy

## 2017-12-02 DIAGNOSIS — R262 Difficulty in walking, not elsewhere classified: Secondary | ICD-10-CM | POA: Diagnosis not present

## 2017-12-02 DIAGNOSIS — G8929 Other chronic pain: Secondary | ICD-10-CM | POA: Insufficient documentation

## 2017-12-02 DIAGNOSIS — R252 Cramp and spasm: Secondary | ICD-10-CM | POA: Insufficient documentation

## 2017-12-02 DIAGNOSIS — M6281 Muscle weakness (generalized): Secondary | ICD-10-CM | POA: Insufficient documentation

## 2017-12-02 DIAGNOSIS — M545 Low back pain, unspecified: Secondary | ICD-10-CM

## 2017-12-03 NOTE — Therapy (Signed)
[place], [place], [address] Phone: [phone]   Fax:  [phone]  Physical Therapy Evaluation  Patient Details  Name: Dawn [name] MRN: [id number] Date of Birth: [birth year] Referring Provider: Dr Joni Fears    Encounter Date: 70/15/2019  PT End of Session - 12/02/17 1229    Visit Number  1    Number of Visits  16    Date for PT Re-Evaluation  01/27/18    Authorization Type  Medicare     PT Start Time  3383    PT Stop Time  1059    PT Time Calculation (min)  44 min    Activity Tolerance  Patient tolerated treatment well    Behavior During Therapy  [facility] for tasks assessed/performed       Past Medical History:  Diagnosis Date  . Allergy   . Hot flashes, menopausal 10/13/2011   Estradiol started   . Multiple sclerosis ([place])   . Neuropathy   . Osteoarthritis   . Vision abnormalities     Past Surgical History:  Procedure Laterality Date  . ABDOMINAL HYSTERECTOMY  2002   partial  . LUMBAR FUSION      There were no vitals filed for this visit.   Subjective Assessment - 12/02/17 1021    Subjective  Patient has a long histroy of lower back pain. She had surgery for a disc problem in 2001 and has had pain since that point. She also has a pelvic tear whcih was recently exacerbated. She has progressive MS as well. She feels pain in her back at different times during the day.     Limitations  Standing;Walking;House hold activities    How long can you stand comfortably?  varys on the time of day and activity     How long can you walk comfortably?  Can walk through the grocery store with a cart     Diagnostic tests  MRI: Multi level degeneration and     Currently in Pain?  Yes    Pain Score  8  Pain reaches an 8/10 daily as the day goes on     Pain Location  Back    Pain Orientation  Right;Left;Lower    Pain Descriptors / Indicators  Aching    Pain Type  Chronic pain    Pain Onset  More  than a month ago    Pain Frequency  Constant    Aggravating Factors   Proloneged positioning, bending     Pain Relieving Factors  Heating pad; resting; medication     Effect of Pain on Daily Activities  Difficulty perfroming ADL's          [facility] PT Assessment - 12/03/17 0001      Assessment   Medical Diagnosis  Low Back Pain     Referring Provider  Dr Joni Fears     Onset Date/Surgical Date  -- 2001    Hand Dominance  Right    Next MD Visit  None Scheduled     Prior Therapy  Has had physical therapy for her back       Precautions   Precautions  None      Restrictions   Weight Bearing Restrictions  No      Balance Screen   Has the patient fallen in the past 6 months  No    Has the patient had a decrease in activity level because of a fear of falling?  No    Is the patient reluctant to leave their home because of a fear of falling?   No      Home Environment   Additional Comments  3 steps into the house. Has a railing.       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; [employer]; Artist;       Cognition   Overall Cognitive Status  Within Functional Limits for tasks assessed    Attention  Focused    Focused Attention  Appears intact    Memory  Appears intact    Awareness  Appears intact    Problem Solving  Appears intact      Observation/Other Assessments   Observations  sits with forward flexion     Focus on Therapeutic Outcomes (FOTO)   64% limitation       Sensation   Additional Comments  Pain tradiates into hips at times       Coordination   Gross Motor Movements are Fluid and Coordinated  Yes    Fine Motor Movements are Fluid and Coordinated  Yes      Posture/Postural Control   Posture Comments  rounded shoulders; forward head; flexied trunk       AROM   Overall AROM Comments  limitd active movement of right hip 2nd to pain     Lumbar Flexion   35    Lumbar Extension  15    Lumbar - Right Side Bend  Normal     Lumbar - Left Side Bend  Pain to the left     Lumbar - Right Rotation  limited 25 % with discmofort     Lumbar - Left Rotation  limited 50% with more discomfort       PROM   PROM Assessment Site  Hip    Right/Left Hip  Right    Right Hip External Rotation   -- declines test 2nd to pain     Right Hip Internal Rotation   -- declines test 2nd to pain       Strength   Right Hip Flexion  3/5    Right Hip ABduction  -- unable to test because of pelivc tear     Left Hip Flexion  3+/5    Left Hip ABduction  4+/5    Left Hip ADduction  4/5      Right Hip   Right Hip Flexion  85      Palpation   Palpation comment  tenderness to palpation in bilateral lateral hips and bilateral parapianls. Increased tightness in bilateral paraspinals but left greater; Tenderness to palpation in the right hip       Ambulation/Gait   Gait Comments  decreased hip flexion bilateral; bilateral lateral movement with gait                 Objective measurements completed on examination: See above findings.      [place] Adult PT Treatment/Exercise - 12/03/17 0001      Lumbar Exercises: Standing   Other Standing Lumbar Exercises  tennis ball trigger point release       Lumbar Exercises: Seated   Other Seated Lumbar Exercises  seated scap retraction with abdominal breathing x10; PPT with breathing x10;                PT Short Term Goals -  12/02/17 1448      PT SHORT TERM GOAL #1   Title  Patient will increase right passive hip flexion to 105 degrees without pain     Time  4    Period  Weeks    Status  New    Target Date  12/30/17      PT SHORT TERM GOAL #2   Title  Patient will increase gross bilateral lower extremity strength to 4+/5     Time  4    Period  Weeks    Status  New    Target Date  12/30/17      PT SHORT TERM GOAL #3   Title  Patient will be independent with basic HEP     Time  4    Period  Weeks     Status  New    Target Date  12/30/17        PT Long Term Goals - 12/02/17 1450      PT LONG TERM GOAL #1   Title  Patient will stand for 1 hour without self reported increase pain in order to stand at a kitchen sink.    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 Patients MRI shows multi level degeneration. Her back pain increases as her fatigue level increases. She has tenderness to palpation in her hips and lower back. At this time she also reports increased pain from a unspecified " pelvic tear". Therapy could not find in her notes what muscle is torn but she reports increased pain on the right with internal and external rotation of the hip and with hip flexion. She would benefit from skilled therapy to decelope a home program that will promote core stability and hip mobility. She was seen for a moderate complexity evaluation     History and Personal Factors relevant to plan of care:  MS, pelvic tear; baseline vertigo;     Clinical Presentation  Evolving    Clinical Presentation due to:  Pain that is increasing recently as well as pelvic issue that will effect her POC      Clinical Decision Making  High    Rehab Potential  Good    PT Frequency  2x / week    PT Duration  8 weeks    PT Treatment/Interventions  ADLs/Self Care Home Management;Cryotherapy;Electrical Stimulation;Ultrasound;Moist Heat;Iontophoresis 70m/ml Dexamethasone;Stair training;Functional mobility  training;Therapeutic activities;Therapeutic exercise;Manual techniques;Passive range of motion;Dry needling;Taping    PT Next Visit Plan  Patients POC may be complicated. She has pain with movement of her right pelvis at this time, conside any  type of core stability she can tolerate. Consider seated ball exercises; postural exercises. Look closer at her hip. She would benefit from a single leg to chest stretch but can not tolerate at this time. Has pain in the front. She may benefit from a thomas stretch but will likely not tolerate it at this time.     PT Home Exercise Plan  seated core breathing with scap retraction; seated posterior pelvic tilt; tennis ball trigger point release    Consulted and Agree with Plan of Care  Patient       Patient will benefit from skilled therapeutic intervention in order to improve the following deficits and impairments:  Difficulty walking, Decreased activity tolerance, Decreased endurance, Decreased strength, Decreased range of motion, Decreased mobility, Pain, Increased fascial restricitons  Visit Diagnosis: Chronic bilateral low back pain without sciatica  Muscle weakness (generalized)  Cramp and spasm  Difficulty in walking, not elsewhere classified     Problem List Patient Active Problem List   Diagnosis Date Noted  . Hand pain 02/20/2017  . Multiple joint pain 02/18/2017  . Right elbow pain 12/09/2016  . Disturbed cognition 06/25/2016  . Trochanteric bursitis of left hip 02/18/2016  . Sciatica, right side 10/30/2015  . Bilateral arm pain 10/10/2015  . Trochanteric bursitis of right hip 08/04/2014  . Adjustment disorder with mixed anxiety and depressed mood 08/04/2014  . Chronic fatigue 08/04/2014  . Urinary frequency 08/04/2014  . Abnormality of gait 08/04/2014  . Unspecified visual disturbance 01/31/2013  . Routine general medical examination at a health care facility 11/04/2011  . Colon polyps 11/03/2011  . Hot flashes, menopausal  10/13/2011  . POSTHERPETIC NEURALGIA 10/03/2009  . DISPLCMT LUMBAR INTERVERT [place] W/O MYELOPATHY 09/05/2009  . RENAL CALCULUS, RECURRENT 09/04/2009  . HYPERLIPIDEMIA 08/31/2009  . MULTIPLE SCLEROSIS, PROGRESSIVE/RELAPSING 08/31/2009  . ALLERGIC RHINITIS 08/31/2009    Carney Living PT  DPT  12/03/2017, 8:22 AM  [facility] [address] [place], [place], [address] Phone: [phone]   Fax:  [phone]  Name: KALANA YUST MRN: [id number] Date of Birth: [birth year]

## 2017-12-06 ENCOUNTER — Other Ambulatory Visit: Payer: Self-pay | Admitting: Neurology

## 2017-12-07 NOTE — Telephone Encounter (Signed)
Last filled on 10/24/17. Next OV is 02/19/18. Pharmacy confirmed.

## 2017-12-25 ENCOUNTER — Ambulatory Visit: Payer: Medicare Other | Attending: Orthopaedic Surgery | Admitting: Physical Therapy

## 2017-12-25 ENCOUNTER — Encounter: Payer: Self-pay | Admitting: Physical Therapy

## 2017-12-25 DIAGNOSIS — G8929 Other chronic pain: Secondary | ICD-10-CM | POA: Diagnosis not present

## 2017-12-25 DIAGNOSIS — M6281 Muscle weakness (generalized): Secondary | ICD-10-CM | POA: Diagnosis not present

## 2017-12-25 DIAGNOSIS — R262 Difficulty in walking, not elsewhere classified: Secondary | ICD-10-CM | POA: Diagnosis not present

## 2017-12-25 DIAGNOSIS — R252 Cramp and spasm: Secondary | ICD-10-CM

## 2017-12-25 DIAGNOSIS — M545 Low back pain, unspecified: Secondary | ICD-10-CM

## 2017-12-27 NOTE — Therapy (Signed)
[place], [place], [address] Phone: [phone]   Fax:  [phone]  Physical Therapy Treatment  Patient Details  Name: Dawn [name] MRN: [id number] Date of Birth: 03-14-1946 Referring Provider: Dr Joni Fears    Encounter Date: 12/25/2017  PT End of Session - 12/27/17 1915    Visit Number  2    Number of Visits  16    Date for PT Re-Evaluation  01/27/18    Authorization Type  Medicare     PT Start Time  1037    PT Stop Time  1100    PT Time Calculation (min)  23 min    Activity Tolerance  Patient tolerated treatment well    Behavior During Therapy  [facility] for tasks assessed/performed       Past Medical History:  Diagnosis Date  . Allergy   . Hot flashes, menopausal 10/13/2011   Estradiol started   . Multiple sclerosis ([place])   . Neuropathy   . Osteoarthritis   . Vision abnormalities     Past Surgical History:  Procedure Laterality Date  . ABDOMINAL HYSTERECTOMY  2002   partial  . LUMBAR FUSION      There were no vitals filed for this visit.  Subjective Assessment - 12/27/17 1911    Subjective  Patients pain tody is about a 6-7/10 in her lower back. Her ain is across the middle of her back. She was 20 minutes late to her appointment. She has been working on the exercises that were given to her her hip is less painful then it has been.     Limitations  Standing;Walking;House hold activities    How long can you stand comfortably?  varys on the time of day and activity     How long can you walk comfortably?  Can walk through the grocery store with a cart     Diagnostic tests  MRI: Multi level degeneration and     Currently in Pain?  Yes    Pain Score  7     Pain Location  Back    Pain Orientation  Right;Left    Pain Descriptors / Indicators  Aching    Pain Type  Acute pain    Pain Onset  More than a month ago    Pain Frequency  Constant    Aggravating Factors   prolonged poistioning      Pain Relieving Factors  heating pad, rest, medication     Effect of Pain on Daily Activities  difficulty perform.                        [place] Adult PT Treatment/Exercise - 12/27/17 0001      Lumbar Exercises: Stretches   Passive Hamstring Stretch  Limitations;2 reps;20 seconds    Passive Hamstring Stretch Limitations  seated     Lower Trunk Rotation  Limitations    Lower Trunk Rotation Limitations  x10 in pain free ranges     Other Lumbar Stretch Exercise  glute stretch 2x20 sec each leg. Careful to nbot hurt the hip on the right      Lumbar Exercises: Seated   Other Seated Lumbar Exercises  seated scap retraction with abdominal breathing x10; PPT with breathing x10; ball squeeze 2x10; hip abduction 2x10 yellow in pain free range.              PT Education - 12/27/17 1914    Education Details  updated HEP, symptom mangement     Person(s) Educated  Patient    Methods  Explanation    Comprehension  Verbalized understanding;Returned demonstration;Tactile cues required;Verbal cues required       PT Short Term Goals - 12/02/17 1448      PT SHORT TERM GOAL #1   Title  Patient will increase right passive hip flexion to 105 degrees without pain     Time  4    Period  Weeks    Status  New    Target Date  12/30/17      PT SHORT TERM GOAL #2   Title  Patient will increase gross bilateral lower extremity strength to 4+/5     Time  4    Period  Weeks    Status  New    Target Date  12/30/17      PT SHORT TERM GOAL #3   Title  Patient will be independent with basic HEP     Time  4    Period  Weeks    Status  New    Target Date  12/30/17        PT Long Term Goals - 12/02/17 1450      PT LONG TERM GOAL #1   Title  Patient will stand for 1 hour without self reported increase pain in order to stand at a kitchen sink.    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 She had better tolerance to activity with her right hip. She reported hamstring stretches helped her back as well as gluteal stretching. She was given an updated HEP.     Clinical Presentation  Evolving    Clinical Decision Making  High    Rehab Potential  Good    PT Frequency  2x / week    PT Duration  8 weeks    PT Treatment/Interventions  ADLs/Self Care Home Management;Cryotherapy;Electrical Stimulation;Ultrasound;Moist Heat;Iontophoresis 4mg /ml Dexamethasone;Stair training;Functional mobility training;Therapeutic activities;Therapeutic exercise;Manual techniques;Passive range of motion;Dry needling;Taping    PT Next Visit Plan  Patients POC may be complicated. She has pain with movement of her right pelvis at this time, conside any type of core stability she can tolerate. Consider seated ball exercises; postural exercises. Look closer at her hip. She would benefit from a single leg to chest stretch but can not tolerate at this time. Has pain in the front. She may benefit from a thomas stretch but will likely not tolerate it at this time.     PT Home Exercise Plan  seated core breathing with scap retraction; seated posterior pelvic  tilt; tennis ball trigger point release    Consulted and Agree with Plan of Care  Patient       Patient will benefit from skilled therapeutic intervention in order to improve the following deficits and impairments:  Difficulty walking, Decreased activity tolerance, Decreased endurance, Decreased strength, Decreased range of  motion, Decreased mobility, Pain, Increased fascial restricitons  Visit Diagnosis: Chronic bilateral low back pain without sciatica  Muscle weakness (generalized)  Cramp and spasm  Difficulty in walking, not elsewhere classified     Problem List Patient Active Problem List   Diagnosis Date Noted  . Hand pain 02/20/2017  . Multiple joint pain 02/18/2017  . Right elbow pain 12/09/2016  . Disturbed cognition 06/25/2016  . Trochanteric bursitis of left hip 02/18/2016  . Sciatica, right side 10/30/2015  . Bilateral arm pain 10/10/2015  . Trochanteric bursitis of right hip 08/04/2014  . Adjustment disorder with mixed anxiety and depressed mood 08/04/2014  . Chronic fatigue 08/04/2014  . Urinary frequency 08/04/2014  . Abnormality of gait 08/04/2014  . Unspecified visual disturbance 01/31/2013  . Routine general medical examination at a health care facility 11/04/2011  . Colon polyps 11/03/2011  . Hot flashes, menopausal 10/13/2011  . POSTHERPETIC NEURALGIA 10/03/2009  . DISPLCMT LUMBAR INTERVERT [place] W/O MYELOPATHY 09/05/2009  . RENAL CALCULUS, RECURRENT 09/04/2009  . HYPERLIPIDEMIA 08/31/2009  . MULTIPLE SCLEROSIS, PROGRESSIVE/RELAPSING 08/31/2009  . ALLERGIC RHINITIS 08/31/2009    Carney Living PT DPT  12/27/2017, 8:04 PM  [facility] [address] [place], [place], [address] Phone: [phone]   Fax:  [phone]  Name: Dawn [name] MRN: [id number] Date of Birth: 1945/08/22

## 2018-01-08 ENCOUNTER — Encounter: Payer: Self-pay | Admitting: Physical Therapy

## 2018-01-08 ENCOUNTER — Ambulatory Visit: Payer: Medicare Other | Admitting: Physical Therapy

## 2018-01-08 DIAGNOSIS — R262 Difficulty in walking, not elsewhere classified: Secondary | ICD-10-CM

## 2018-01-08 DIAGNOSIS — R252 Cramp and spasm: Secondary | ICD-10-CM | POA: Diagnosis not present

## 2018-01-08 DIAGNOSIS — M6281 Muscle weakness (generalized): Secondary | ICD-10-CM | POA: Diagnosis not present

## 2018-01-08 DIAGNOSIS — G8929 Other chronic pain: Secondary | ICD-10-CM

## 2018-01-08 DIAGNOSIS — M545 Low back pain: Secondary | ICD-10-CM | POA: Diagnosis not present

## 2018-01-08 NOTE — Therapy (Signed)
[place], [place], [address] Phone: [phone]   Fax:  [phone]  Physical Therapy Treatment  Patient Details  Name: Dawn [name] MRN: [id number] Date of Birth: [birth year] Referring Provider: Dr Joni Fears    Encounter Date: 01/08/2018  PT End of Session - 01/08/18 1030    Visit Number  3    Number of Visits  16    Date for PT Re-Evaluation  01/27/18    Authorization Type  Medicare     PT Start Time  1019    PT Stop Time  1100    PT Time Calculation (min)  41 min    Activity Tolerance  Patient tolerated treatment well    Behavior During Therapy  [facility] for tasks assessed/performed       Past Medical History:  Diagnosis Date  . Allergy   . Hot flashes, menopausal 10/13/2011   Estradiol started   . Multiple sclerosis ([place])   . Neuropathy   . Osteoarthritis   . Vision abnormalities     Past Surgical History:  Procedure Laterality Date  . ABDOMINAL HYSTERECTOMY  2002   partial  . LUMBAR FUSION      There were no vitals filed for this visit.  Subjective Assessment - 01/08/18 1025    Subjective  Patient has been able to do her exercises. She has done a lot of walking [place] the beach over the past week but her back hurt. She has been doing her exercises. She was able to use her stretches when she was driving.     Limitations  Standing;Walking;House hold activities    How long can you stand comfortably?  varys on the time of day and activity     How long can you walk comfortably?  Can walk through the grocery store with a cart     Diagnostic tests  MRI: Multi level degeneration and     Currently in Pain?  Yes    Pain Score  7     Pain Location  Back    Pain Orientation  Right;Left    Pain Descriptors / Indicators  Aching    Pain Type  Chronic pain    Pain Onset  More than a month ago    Pain Frequency  Constant    Aggravating Factors   prolonged positioning     Pain Relieving Factors   heating pad, rest     Effect of Pain on Daily Activities  difficulty perfroming daily tasks                        [facility] Adult PT Treatment/Exercise - 01/08/18 0001      Self-Care   Self-Care  Other Self-Care Comments    Other Self-Care Comments   reviewed use of the thera cane for  upper back/ neck and lumbar spine       Shoulder Exercises: Supine   Other Supine Exercises  shoulder extension 2x10 yellow; shoulder retraction yellow 2x10 (standing)     Other Supine Exercises  supine band flexion with sligh abduction 2x10       Shoulder Exercises: Seated   Other Seated Exercises  bilateral ER 2x10 yellow; bilateral horizontal abduction yellow 2x10              PT Education - 01/08/18 1029    Education Details  updated HEP for upper body strengthening     Person(s) Educated  Patient  Methods  Explanation;Demonstration;Tactile cues;Verbal cues    Comprehension  Verbalized understanding;Returned demonstration;Verbal cues required;Tactile cues required       PT Short Term Goals - 01/08/18 1250      PT SHORT TERM GOAL #1   Title  Patient will increase right passive hip flexion to 105 degrees without pain     Time  4    Period  Weeks    Status  On-going      PT SHORT TERM GOAL #2   Title  Patient will increase gross bilateral lower extremity strength to 4+/5     Time  4    Period  Weeks    Status  On-going      PT SHORT TERM GOAL #3   Title  Patient will be independent with basic HEP     Time  4    Period  Weeks    Status  On-going        PT Long Term Goals - 12/02/17 1450      PT LONG TERM GOAL #1   Title  Patient will stand for 1 hour without self reported increase pain in order to stand at a kitchen sink.    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 She also had some spasming of her upper trap. Therapy worked on light manual therapy to the upper trap. she reported improved pain after treatment. She had no increase in pain with her exercises.     Clinical Presentation  Evolving    Clinical Decision Making  High    Rehab Potential  Good    PT Frequency  2x / week    PT Duration  8 weeks    PT Treatment/Interventions  ADLs/Self Care Home Management;Cryotherapy;Electrical Stimulation;Ultrasound;Moist Heat;Iontophoresis 4mg /ml Dexamethasone;Stair training;Functional mobility training;Therapeutic activities;Therapeutic exercise;Manual techniques;Passive range of motion;Dry needling;Taping    PT Next Visit Plan  continue to review exercise. Consider standing exercises.     PT Home Exercise Plan  seated core breathing with scap retraction; seated posterior pelvic tilt; tennis ball trigger point release    Consulted and Agree with Plan of Care  Patient       Patient will benefit from skilled therapeutic intervention in order to improve the following deficits and impairments:  Difficulty walking, Decreased activity tolerance, Decreased endurance, Decreased strength, Decreased range of motion, Decreased mobility, Pain, Increased fascial restricitons  Visit Diagnosis: Chronic bilateral low back pain without sciatica  Muscle weakness (generalized)  Cramp and spasm  Difficulty in walking, not elsewhere classified     Problem List Patient Active Problem List   Diagnosis Date Noted  . Hand pain  02/20/2017  . Multiple joint pain 02/18/2017  . Right elbow pain 12/09/2016  . Disturbed cognition 06/25/2016  . Trochanteric bursitis of left hip  02/18/2016  . Sciatica, right side 10/30/2015  . Bilateral arm pain 10/10/2015  . Trochanteric bursitis of right hip 08/04/2014  . Adjustment disorder with mixed anxiety and depressed mood 08/04/2014  . Chronic fatigue 08/04/2014  . Urinary frequency 08/04/2014  . Abnormality of gait 08/04/2014  . Unspecified visual disturbance 01/31/2013  . Routine general medical examination at a health care facility 11/04/2011  . Colon polyps 11/03/2011  . Hot flashes, menopausal 10/13/2011  . POSTHERPETIC NEURALGIA 10/03/2009  . DISPLCMT LUMBAR INTERVERT [place] W/O MYELOPATHY 09/05/2009  . RENAL CALCULUS, RECURRENT 09/04/2009  . HYPERLIPIDEMIA 08/31/2009  . MULTIPLE SCLEROSIS, PROGRESSIVE/RELAPSING 08/31/2009  . ALLERGIC RHINITIS 08/31/2009    Carney Living PT DPT  01/08/2018, 12:54 PM  [facility] [address] [place], [place], [address] Phone: [phone]   Fax:  [phone]  Name: Dawn [name] MRN: [id number] Date of Birth: 12-19-1945

## 2018-01-22 ENCOUNTER — Ambulatory Visit: Payer: Medicare Other | Admitting: Physical Therapy

## 2018-01-29 ENCOUNTER — Encounter: Payer: Self-pay | Admitting: Physical Therapy

## 2018-01-29 ENCOUNTER — Ambulatory Visit: Payer: Medicare Other | Attending: Orthopaedic Surgery | Admitting: Physical Therapy

## 2018-01-29 DIAGNOSIS — R252 Cramp and spasm: Secondary | ICD-10-CM | POA: Diagnosis not present

## 2018-01-29 DIAGNOSIS — G8929 Other chronic pain: Secondary | ICD-10-CM | POA: Diagnosis not present

## 2018-01-29 DIAGNOSIS — R262 Difficulty in walking, not elsewhere classified: Secondary | ICD-10-CM | POA: Diagnosis not present

## 2018-01-29 DIAGNOSIS — M6281 Muscle weakness (generalized): Secondary | ICD-10-CM | POA: Diagnosis not present

## 2018-01-29 DIAGNOSIS — M545 Low back pain, unspecified: Secondary | ICD-10-CM

## 2018-01-29 NOTE — Therapy (Signed)
[place], [place], [address] Phone: [phone]   Fax:  [phone]  Physical Therapy Treatment/ Re-cert   Patient Details  Name: Dawn [name] MRN: [id number] Date of Birth: 02/10/1946 Referring Provider: Dr Joni Fears    Encounter Date: 01/29/2018  PT End of Session - 01/29/18 1024    Visit Number  4    Number of Visits  16    Date for PT Re-Evaluation  02/26/18    Authorization Type  Medicare     PT Start Time  1019    PT Stop Time  1100    PT Time Calculation (min)  41 min    Activity Tolerance  Patient tolerated treatment well    Behavior During Therapy  [facility] for tasks assessed/performed       Past Medical History:  Diagnosis Date  . Allergy   . Hot flashes, menopausal 10/13/2011   Estradiol started   . Multiple sclerosis ([place])   . Neuropathy   . Osteoarthritis   . Vision abnormalities     Past Surgical History:  Procedure Laterality Date  . ABDOMINAL HYSTERECTOMY  2002   partial  . LUMBAR FUSION      There were no vitals filed for this visit.  Subjective Assessment - 01/29/18 1027    Subjective  Patient reports she is in the middle of an acute exacerbation of her MD. She reports it is mostly effecting her hands. She has been working on the exercises that she can.     Limitations  Standing;Walking;House hold activities    How long can you stand comfortably?  varys on the time of day and activity     How long can you walk comfortably?  Can walk through the grocery store with a cart     Diagnostic tests  MRI: Multi level degeneration and     Currently in Pain?  Yes    Pain Score  7     Pain Location  Back    Pain Orientation  Right;Left    Pain Descriptors / Indicators  Aching    Pain Type  Chronic pain    Pain Onset  More than a month ago    Pain Frequency  Constant    Aggravating Factors   prolonged positioning     Pain Relieving Factors  heating pad, rest     Effect of  Pain on Daily Activities  difficulty perfroming daily tasks          [facility] PT Assessment - 01/29/18 0001      AROM   Lumbar Flexion  50    Lumbar Extension  18    Lumbar - Left Rotation  limited 25%       PROM   Overall PROM Comments  100 degreees of right hip passive ROM without pain     Right Hip External Rotation   -- can perfrom with out pain       Strength   Left Hip Flexion  4/5    Left Hip ABduction  4+/5    Left Hip ADduction  4+/5      Right Hip   Right Hip Flexion  100      Palpation   Palpation comment  imporved tenderness to palpation       Ambulation/Gait   Gait Comments  fit walker for her properly  [place] Adult PT Treatment/Exercise - 01/29/18 0001      Lumbar Exercises: Stretches   Passive Hamstring Stretch  Limitations;2 reps;20 seconds    Passive Hamstring Stretch Limitations  seated       Lumbar Exercises: Standing   Other Standing Lumbar Exercises  standing hip abdcution and extension x10 each side; standing march 2x10 each side; minoi squats with mod cuing for tehcnique x10;       Lumbar Exercises: Seated   Other Seated Lumbar Exercises  seated ball roll forward and side to side              PT Education - 01/29/18 1301    Education Details  reviewed HEP     Person(s) Educated  Patient    Methods  Explanation;Demonstration;Tactile cues;Verbal cues    Comprehension  Verbalized understanding;Returned demonstration;Verbal cues required;Tactile cues required       PT Short Term Goals - 01/29/18 1306      PT SHORT TERM GOAL #1   Title  Patient will increase right passive hip flexion to 105 degrees without pain     Baseline  100 with minor pain     Time  4    Period  Weeks    Status  On-going      PT SHORT TERM GOAL #2   Title  Patient will increase gross bilateral lower extremity strength to 4+/5     Baseline  4/5 hip flexion 4+/5 hip abduction     Time  4    Period  Weeks    Status  Partially Met       PT SHORT TERM GOAL #3   Title  Patient will be independent with basic HEP     Baseline  independnet with exercises given to her at this time     Time  4    Period  Weeks    Status  Achieved        PT Long Term Goals - 12/02/17 1450      PT LONG TERM GOAL #1   Title  Patient will stand for 1 hour without self reported increase pain in order to stand at a kitchen sink.    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 She was advised that reaserch shows that too much exercise during an acute exacerbation can worsen symptoms. She has been doing only light activity. Therapy reviewed standing exercises with her today and reviewed light lumbar strengthening exercises. Therapy also fit her walker to reduce  stress on her shoulders. She was shown how to adjust more if needed. She woullad benefit from further therapy 1x a week for 4 more weeks. She is interested in dry needling for her lumbar spine. Therapy held on this today 2nd to acute inflammation. She feels like everything is improving. See below for goal specific progress.     Clinical Presentation  Evolving    Clinical Decision Making  High    Rehab Potential  Good    PT Frequency  2x / week    PT Duration  8 weeks    PT Treatment/Interventions  ADLs/Self Care Home Management;Cryotherapy;Electrical Stimulation;Ultrasound;Moist Heat;Iontophoresis 66m/ml  Dexamethasone;Stair training;Functional mobility training;Therapeutic activities;Therapeutic exercise;Manual techniques;Passive range of motion;Dry needling;Taping    PT Next Visit Plan  continue to review exercise. Consider standing exercises.     PT Home Exercise Plan  seated core breathing with scap retraction; seated posterior pelvic tilt; tennis ball trigger point release    Consulted and Agree with Plan of Care  Patient       Patient will benefit from skilled therapeutic intervention in order to improve the following deficits and impairments:  Difficulty walking, Decreased activity tolerance, Decreased endurance, Decreased strength, Decreased range of motion, Decreased mobility, Pain, Increased fascial restricitons  Visit Diagnosis: Chronic bilateral low back pain without sciatica  Muscle weakness (generalized)  Cramp and spasm  Difficulty in walking, not elsewhere classified     Problem List Patient Active Problem List   Diagnosis Date Noted  . Hand pain 02/20/2017  . Multiple joint pain 02/18/2017  . Right elbow pain 12/09/2016  . Disturbed cognition 06/25/2016  . Trochanteric bursitis of left hip 02/18/2016  . Sciatica, right side 10/30/2015  . Bilateral arm pain 10/10/2015  . Trochanteric bursitis of right hip 08/04/2014  . Adjustment disorder with mixed anxiety and depressed mood 08/04/2014  . Chronic fatigue 08/04/2014  . Urinary frequency 08/04/2014  . Abnormality of gait 08/04/2014  . Unspecified visual disturbance 01/31/2013  . Routine general medical examination at a health care facility 11/04/2011  . Colon polyps 11/03/2011  . Hot flashes, menopausal 10/13/2011  . POSTHERPETIC NEURALGIA 10/03/2009  . [place]BAR [place] MY[place]OP[place] 09/05/2009  . RENAL CALCULUS, RECURRENT 09/04/2009  . HYPERLIPIDEMIA 08/31/2009  . MULTIPLE SCLEROSIS, PROGRESSIVE/RELAPSING 08/31/2009  . ALLERGIC RHINITIS 08/31/2009    DCarney Living7/06/2018, 1:14  PM  [place] [address]Phone: [phone]  Fax:  [phone] Name: MHOLLEE FATEMRN: [id number] of Birth: 612/02/1946

## 2018-02-03 ENCOUNTER — Other Ambulatory Visit: Payer: Self-pay | Admitting: Neurology

## 2018-02-04 NOTE — Telephone Encounter (Signed)
Rx registry checked. Last fill date was 12/07/17 for #45. Last OV was 08/12/17 and next OV is 02/19/18.

## 2018-02-05 ENCOUNTER — Encounter

## 2018-02-05 ENCOUNTER — Ambulatory Visit: Payer: Medicare Other | Admitting: Physical Therapy

## 2018-02-05 DIAGNOSIS — M545 Low back pain: Principal | ICD-10-CM

## 2018-02-05 DIAGNOSIS — R262 Difficulty in walking, not elsewhere classified: Secondary | ICD-10-CM

## 2018-02-05 DIAGNOSIS — M6281 Muscle weakness (generalized): Secondary | ICD-10-CM | POA: Diagnosis not present

## 2018-02-05 DIAGNOSIS — R252 Cramp and spasm: Secondary | ICD-10-CM

## 2018-02-05 DIAGNOSIS — G8929 Other chronic pain: Secondary | ICD-10-CM | POA: Diagnosis not present

## 2018-02-08 ENCOUNTER — Encounter: Payer: Self-pay | Admitting: Physical Therapy

## 2018-02-08 NOTE — Therapy (Signed)
[place], [place], [address] Phone: [phone]   Fax:  [phone]  Physical Therapy Treatment  Patient Details  Name: Dawn [name] MRN: [id number] Date of Birth: [birth year] Referring Provider: Dr Joni Fears    Encounter Date: 02/05/2018  PT End of Session - 02/08/18 1535    Visit Number  5    Number of Visits  16    Date for PT Re-Evaluation  02/26/18    Authorization Type  Medicare     PT Start Time  1145    PT Stop Time  1227    PT Time Calculation (min)  42 min    Activity Tolerance  Patient tolerated treatment well    Behavior During Therapy  [facility] for tasks assessed/performed       Past Medical History:  Diagnosis Date  . Allergy   . Hot flashes, menopausal 10/13/2011   Estradiol started   . Multiple sclerosis ([place])   . Neuropathy   . Osteoarthritis   . Vision abnormalities     Past Surgical History:  Procedure Laterality Date  . ABDOMINAL HYSTERECTOMY  2002   partial  . LUMBAR FUSION      There were no vitals filed for this visit.  Subjective Assessment - 02/08/18 1530    Subjective  Patient reports the her acute MS flair has subsided She is interested in trying dry needling. She has been woking on her exercises at home.     Limitations  Standing;Walking;House hold activities    How long can you stand comfortably?  varys on the time of day and activity     How long can you walk comfortably?  Can walk through the grocery store with a cart     Diagnostic tests  MRI: Multi level degeneration and     Currently in Pain?  Yes    Pain Score  5     Pain Location  Back    Pain Orientation  Right;Left    Pain Descriptors / Indicators  Aching    Pain Type  Chronic pain    Pain Onset  More than a month ago    Pain Frequency  Constant    Aggravating Factors   proloinged positioning     Pain Relieving Factors  hating pad, rest     Effect of Pain on Daily Activities  difficulty  pefroming daily tasks                        [facility] Adult PT Treatment/Exercise - 02/08/18 0001      Lumbar Exercises: Stretches   Passive Hamstring Stretch  Limitations;2 reps;20 seconds    Passive Hamstring Stretch Limitations  seated     Other Lumbar Stretch Exercise  glute stretch 2x20 sec each leg. Careful to nbot hurt the hip on the right      Lumbar Exercises: Seated   Other Seated Lumbar Exercises  reviewed tennis ball trigger point release       Shoulder Exercises: Supine   Other Supine Exercises  supine cl;amshell 2x10 red' supine ball squeeze 21x10 red       Manual Therapy   Manual Therapy  Soft tissue mobilization    Manual therapy comments  Gentle LAD bilateral     Soft tissue mobilization  to upper glutes and bilateral lumbar paraspinals using IASTYM;        Trigger Point Dry Needling - 02/08/18 1617  Consent Given?  Yes    Education Handout Provided  No    Longissimus Response  Twitch response elicited    Gluteus Minimus Response  Twitch response elicited glut medius            PT Education - 02/08/18 1535    Education Details  benefits and risks of TPDN     Person(s) Educated  Patient    Methods  Explanation;Demonstration;Tactile cues;Verbal cues;Handout    Comprehension  Verbalized understanding;Returned demonstration;Verbal cues required;Tactile cues required       PT Short Term Goals - 01/29/18 1306      PT SHORT TERM GOAL #1   Title  Patient will increase right passive hip flexion to 105 degrees without pain     Baseline  100 with minor pain     Time  4    Period  Weeks    Status  On-going      PT SHORT TERM GOAL #2   Title  Patient will increase gross bilateral lower extremity strength to 4+/5     Baseline  4/5 hip flexion 4+/5 hip abduction     Time  4    Period  Weeks    Status  Partially Met      PT SHORT TERM GOAL #3   Title  Patient will be independent with basic HEP     Baseline  independnet with exercises given  to her at this time     Time  4    Period  Weeks    Status  Achieved        PT Long Term Goals - 12/02/17 1450      PT LONG TERM GOAL #1   Title  Patient will stand for 1 hour without self reported increase pain in order to stand at a kitchen sink.    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 She had a good twtich respose in both her glutes and her lumbar parapsinals. Therapy performed needling to her left gluteal and left paraspinal and right paraspinal. Therapy also reviewed techniques to reduce post needle soreness.     Clinical Presentation  Evolving    Clinical Decision Making  High    Rehab Potential  Good    PT Frequency  2x / week    PT Duration  8 weeks    PT Treatment/Interventions  ADLs/Self Care Home Management;Cryotherapy;Electrical Stimulation;Ultrasound;Moist Heat;Iontophoresis 46m/ml Dexamethasone;Stair training;Functional mobility training;Therapeutic activities;Therapeutic exercise;Manual techniques;Passive range of motion;Dry needling;Taping    PT Next Visit Plan  continue to review exercise. Consider standing exercises.     PT Home Exercise Plan  seated core  breathing with scap retraction; seated posterior pelvic tilt; tennis ball trigger point release       Patient will benefit from skilled therapeutic intervention in order to improve the following deficits and impairments:  Difficulty walking, Decreased activity tolerance, Decreased endurance, Decreased strength, Decreased range  of motion, Decreased mobility, Pain, Increased fascial restricitons  Visit Diagnosis: Chronic bilateral low back pain without sciatica  Muscle weakness (generalized)  Cramp and spasm  Difficulty in walking, not elsewhere classified     Problem List Patient Active Problem List   Diagnosis Date Noted  . Hand pain 02/20/2017  . Multiple joint pain 02/18/2017  . Right elbow pain 12/09/2016  . Disturbed cognition 06/25/2016  . Trochanteric bursitis of left hip 02/18/2016  . Sciatica, right side 10/30/2015  . Bilateral arm pain 10/10/2015  . Trochanteric bursitis of right hip 08/04/2014  . Adjustment disorder with mixed anxiety and depressed mood 08/04/2014  . Chronic fatigue 08/04/2014  . Urinary frequency 08/04/2014  . Abnormality of gait 08/04/2014  . Unspecified visual disturbance 01/31/2013  . Routine general medical examination at a health care facility 11/04/2011  . Colon polyps 11/03/2011  . Hot flashes, menopausal 10/13/2011  . POSTHERPETIC NEURALGIA 10/03/2009  . DISPLCMT LUMBAR INTERVERT [place] W/O MYELOPATHY 09/05/2009  . RENAL CALCULUS, RECURRENT 09/04/2009  . HYPERLIPIDEMIA 08/31/2009  . MULTIPLE SCLEROSIS, PROGRESSIVE/RELAPSING 08/31/2009  . ALLERGIC RHINITIS 08/31/2009    Carney Living PT DPT  02/08/2018, 4:29 PM  [facility] [address] [place], [place], [address] Phone: [phone]   Fax:  [phone]  Name: Dawn [name] MRN: [id number] Date of Birth: [birth year]

## 2018-02-12 ENCOUNTER — Encounter: Payer: Self-pay | Admitting: Physical Therapy

## 2018-02-12 ENCOUNTER — Ambulatory Visit: Payer: Medicare Other | Admitting: Physical Therapy

## 2018-02-12 DIAGNOSIS — M6281 Muscle weakness (generalized): Secondary | ICD-10-CM | POA: Diagnosis not present

## 2018-02-12 DIAGNOSIS — R252 Cramp and spasm: Secondary | ICD-10-CM

## 2018-02-12 DIAGNOSIS — M545 Low back pain: Principal | ICD-10-CM

## 2018-02-12 DIAGNOSIS — R262 Difficulty in walking, not elsewhere classified: Secondary | ICD-10-CM

## 2018-02-12 DIAGNOSIS — G8929 Other chronic pain: Secondary | ICD-10-CM | POA: Diagnosis not present

## 2018-02-15 NOTE — Therapy (Signed)
[place], [place], [address] Phone: [phone]   Fax:  [phone]  Physical Therapy Treatment  Patient Details  Name: Dawn [name] MRN: [id number] Date of Birth: August 11, 1945 Referring Provider: Dr Joni Fears    Encounter Date: 02/12/2018  PT End of Session - 02/15/18 0812    Visit Number  6    Number of Visits  16    Date for PT Re-Evaluation  02/26/18    Authorization Type  Medicare     PT Start Time  3662    PT Stop Time  1233    PT Time Calculation (min)  48 min    Activity Tolerance  Patient tolerated treatment well    Behavior During Therapy  [facility] for tasks assessed/performed       Past Medical History:  Diagnosis Date  . Allergy   . Hot flashes, menopausal 10/13/2011   Estradiol started   . Multiple sclerosis ([place])   . Neuropathy   . Osteoarthritis   . Vision abnormalities     Past Surgical History:  Procedure Laterality Date  . ABDOMINAL HYSTERECTOMY  2002   partial  . LUMBAR FUSION      There were no vitals filed for this visit.  Subjective Assessment - 02/15/18 0810    Subjective  Patient is having pain in her left hip and left side of her lower back. She felt the needling helped her until Tuesday. She was even able to go out for a walk on Saturday     Limitations  Standing;Walking;House hold activities    How long can you stand comfortably?  varys on the time of day and activity     How long can you walk comfortably?  Can walk through the grocery store with a cart     Diagnostic tests  MRI: Multi level degeneration and     Currently in Pain?  Yes    Pain Score  4     Pain Location  Back    Pain Orientation  Right;Left    Pain Descriptors / Indicators  Aching    Pain Type  Chronic pain    Pain Onset  More than a month ago    Aggravating Factors   prolongedpositioning     Pain Relieving Factors  heating pads     Effect of Pain on Daily Activities  difficulty  perfroming daily tasks                        [facility] Adult PT Treatment/Exercise - 02/15/18 0001      Lumbar Exercises: Stretches   Passive Hamstring Stretch  Limitations;2 reps;20 seconds    Passive Hamstring Stretch Limitations  seated     Other Lumbar Stretch Exercise  glute stretch 2x20 sec each leg. Careful to nbot hurt the hip on the right      Lumbar Exercises: Supine   AB Set Limitations  reviewed breathing with exercises     Clam Limitations  2x10 red     Bent Knee Raise Limitations  2x10     Other Supine Lumbar Exercises  ball squeeze 2x10       Manual Therapy   Manual Therapy  Soft tissue mobilization    Manual therapy comments  Gentle LAD bilateral     Soft tissue mobilization  to upper glutes and bilateral lumbar paraspinals using IASTYM;        Trigger Point Dry Needling -  02/15/18 0855    Consent Given?  Yes    Longissimus Response  Twitch response elicited    Gluteus Minimus Response  Twitch response elicited           PT Education - 02/15/18 0811    Education Details  benefits and risks of TPDN     Person(s) Educated  Patient    Methods  Explanation;Demonstration;Tactile cues;Verbal cues    Comprehension  Verbalized understanding;Returned demonstration;Verbal cues required;Tactile cues required;Need further instruction       PT Short Term Goals - 01/29/18 1306      PT SHORT TERM GOAL #1   Title  Patient will increase right passive hip flexion to 105 degrees without pain     Baseline  100 with minor pain     Time  4    Period  Weeks    Status  On-going      PT SHORT TERM GOAL #2   Title  Patient will increase gross bilateral lower extremity strength to 4+/5     Baseline  4/5 hip flexion 4+/5 hip abduction     Time  4    Period  Weeks    Status  Partially Met      PT SHORT TERM GOAL #3   Title  Patient will be independent with basic HEP     Baseline  independnet with exercises given to her at this time     Time  4    Period   Weeks    Status  Achieved        PT Long Term Goals - 12/02/17 1450      PT LONG TERM GOAL #1   Title  Patient will stand for 1 hour without self reported increase pain in order to stand at a kitchen sink.    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 She feels like it is working well to loosen her muscles. She has been working on her exercises at home. The patient is making good progress.     Clinical Presentation  Evolving    Clinical Decision Making  High    Rehab Potential  Good    PT Frequency  2x / week    PT Duration  8 weeks    PT Treatment/Interventions  ADLs/Self Care Home Management;Cryotherapy;Electrical Stimulation;Ultrasound;Moist Heat;Iontophoresis 79m/ml Dexamethasone;Stair training;Functional mobility training;Therapeutic activities;Therapeutic exercise;Manual techniques;Passive range of motion;Dry needling;Taping    PT Next Visit Plan  continue to review exercise. Consider standing exercises.     PT Home Exercise Plan  seated core breathing with scap retraction; seated posterior pelvic tilt; tennis  ball trigger point release    Consulted and Agree with Plan of Care  Patient       Patient will benefit from skilled therapeutic intervention in order to improve the following deficits and impairments:  Difficulty walking, Decreased activity tolerance, Decreased endurance, Decreased strength, Decreased range of motion, Decreased mobility, Pain, Increased fascial  restricitons  Visit Diagnosis: Chronic bilateral low back pain without sciatica  Muscle weakness (generalized)  Cramp and spasm  Difficulty in walking, not elsewhere classified     Problem List Patient Active Problem List   Diagnosis Date Noted  . Hand pain 02/20/2017  . Multiple joint pain 02/18/2017  . Right elbow pain 12/09/2016  . Disturbed cognition 06/25/2016  . Trochanteric bursitis of left hip 02/18/2016  . Sciatica, right side 10/30/2015  . Bilateral arm pain 10/10/2015  . Trochanteric bursitis of right hip 08/04/2014  . Adjustment disorder with mixed anxiety and depressed mood 08/04/2014  . Chronic fatigue 08/04/2014  . Urinary frequency 08/04/2014  . Abnormality of gait 08/04/2014  . Unspecified visual disturbance 01/31/2013  . Routine general medical examination at a health care facility 11/04/2011  . Colon polyps 11/03/2011  . Hot flashes, menopausal 10/13/2011  . POSTHERPETIC NEURALGIA 10/03/2009  . DISPLCMT LUMBAR INTERVERT [place] W/O MYELOPATHY 09/05/2009  . RENAL CALCULUS, RECURRENT 09/04/2009  . HYPERLIPIDEMIA 08/31/2009  . MULTIPLE SCLEROSIS, PROGRESSIVE/RELAPSING 08/31/2009  . ALLERGIC RHINITIS 08/31/2009    Carney Living  PT DPT  02/15/2018, 8:59 AM  [facility] [address] [place], [place], [address] Phone: [phone]   Fax:  [phone]  Name: ITHZEL FEDORCHAK MRN: [id number] Date of Birth: 1945-11-24

## 2018-02-19 ENCOUNTER — Encounter: Payer: Self-pay | Admitting: Neurology

## 2018-02-19 ENCOUNTER — Ambulatory Visit (INDEPENDENT_AMBULATORY_CARE_PROVIDER_SITE_OTHER): Payer: Medicare Other | Admitting: Neurology

## 2018-02-19 VITALS — BP 158/86 | HR 71 | Ht 62.0 in | Wt 179.0 lb

## 2018-02-19 DIAGNOSIS — R5382 Chronic fatigue, unspecified: Secondary | ICD-10-CM | POA: Diagnosis not present

## 2018-02-19 DIAGNOSIS — R350 Frequency of micturition: Secondary | ICD-10-CM

## 2018-02-19 DIAGNOSIS — M7062 Trochanteric bursitis, left hip: Secondary | ICD-10-CM | POA: Diagnosis not present

## 2018-02-19 DIAGNOSIS — M7061 Trochanteric bursitis, right hip: Secondary | ICD-10-CM | POA: Diagnosis not present

## 2018-02-19 DIAGNOSIS — G35 Multiple sclerosis: Secondary | ICD-10-CM

## 2018-02-19 DIAGNOSIS — M2550 Pain in unspecified joint: Secondary | ICD-10-CM

## 2018-02-19 DIAGNOSIS — R778 Other specified abnormalities of plasma proteins: Secondary | ICD-10-CM | POA: Insufficient documentation

## 2018-02-19 DIAGNOSIS — R269 Unspecified abnormalities of gait and mobility: Secondary | ICD-10-CM | POA: Diagnosis not present

## 2018-02-19 MED ORDER — LEFLUNOMIDE 20 MG PO TABS: 20.0000 mg | ORAL_TABLET | Freq: Every day | ORAL | 11 refills | Status: DC

## 2018-02-19 MED ORDER — GABAPENTIN 300 MG PO CAPS: 300.0000 mg | ORAL_CAPSULE | Freq: Three times a day (TID) | ORAL | 11 refills | Status: DC

## 2018-02-19 MED ORDER — OXYBUTYNIN CHLORIDE 5 MG PO TABS: 5.0000 mg | ORAL_TABLET | Freq: Two times a day (BID) | ORAL | 11 refills | Status: DC

## 2018-02-19 MED ORDER — ETODOLAC 400 MG PO TABS: 400.0000 mg | ORAL_TABLET | Freq: Two times a day (BID) | ORAL | 9 refills | Status: DC

## 2018-02-19 MED ORDER — CLONAZEPAM 0.5 MG PO TABS: 0.5000 mg | ORAL_TABLET | Freq: Every day | ORAL | 5 refills | Status: DC

## 2018-02-19 MED ORDER — MODAFINIL 200 MG PO TABS: ORAL_TABLET | ORAL | 5 refills | Status: DC

## 2018-02-19 NOTE — Progress Notes (Signed)
[facility]  PATIENT: Dawn [name] DOB: 12/02/1945  REFERRING CLINICIAN: Aura Dials HISTORY FROM: Patient REASON FOR VISIT: MS   HISTORICAL  CHIEF COMPLAINT:  Chief Complaint  Patient presents with  . Multiple Sclerosis    She has continued doing well [place]. Has not seen the hematologist.  . Hip/Back pain    Been going to PT - lower back pain is better but the hip pain is the same.   [name] Kitchen MRI    Completed - would like to review.    HISTORY OF PRESENT ILLNESS:  Dawn [name] is a 72 y.o. woman who was diagnosed with multiple sclerosis in 2001.      Update 02/19/2018: Her MS is doing well.   She is on Leflunomide and she tolerates it well.  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    She feels she has slowly progressed over the last couple years with gait.    Her memory is also worse.    She tries to keep herself mentally active.    She notes reduced STM and reduced focus/attentipon.   She left the stove on once.    Usually, when she forgets, she will remember later or remember with a hint.       She is doing PT for her lower back pain and feels it is doing well.    She is doing dry needling with benefit.    She is on etodolac but is no longer on gabapentin and tramadol.     The bursitis pain has returned.    She gets ome benefit from the trochanteric bursa injections she had in the past.      She continues to have some generalized joint pain though the hips are much more painful than elsewhere.  Due to her back and hip pain she saw orthopedics.  An MRI of the lumbar spine was performed showing a focus in the L1 vertebral body.  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   She was referred to hematology but the appointment was made in [place] and she was unable to go there and she has not followed up.       Update 10/20/2017: She is reporting more pain in the hips, left > right.  Pain is worse when  she is laying on her sides or if she stands or walks a while.  She received benefit from trochanteric bursae injections in the past.   We discussed her seeing Ortho.     The right elbow pain is much batter after a shot in the past.    Her MS is doing well.  She is on leflunomide (she had issues getting patient assistance for Aubagio).  She is tolerating it well.  She has not had any exacerbations since starting.  She feels her MS is stable.  She notes no new issues with her gait, strength or sensation.  Balance is still off at times.  She has some urinary frequency and urgency but no incontinence.   She stopped Myrbetriq and went back to [place] because she had a mild rash.    Update 08/12/2017: She saw Rheumatology [facility] [place]) and they feel that she does not have RA or Sjogren's.    She switched form Aubagio to Leflunomide due to insurance issues and very high copay.    She notes that she feels better overall and copay is only $8.  However, she notes that her mouth is dryer and throat is scratchy much of the time.   She also notes mild dry eyes.   She has not seen ophthalmology in several years.      She denies new issues with gait, strength or sensation.    No change in bladder with frequency and nocturia (twice mostly).    She notes less worry and stress since Aubagio was switched to Leflunomide as she was going to have very high copays.     She feels better with less dysesthesia since switching to leflunomide   She has bilateral hip pain again.  Pain worsens with lying on her side or standing a while. In the past, trochanteric bursae injections were beneficial  Update 04/14/2017: She is on Aubagio but is just about out.   [employer] is not able to help her get Aubagio without a very high copay.  She is needing to call the "charity" daily to see if new funds are available.   We discussed trying Leflunomide which is a precursor of teriflunomide and used for rheumatoid arthritis.    I have used  this for other patients with the same problems getting medication.     For the most part, her MS is stable but she is noting more burning dysesthesia in her feet.   Aspercreme helps Tramadol was not well tolerated.   Etodolac helps the arthritic pain some.   Her elbow pain improved with an injection.    A piriformis injection only helped a week.   She has myalgias throughout her body.     She has had dark urine a few times but it only lasted one day.    Bladder function is unchanged and Vesicare helps the frequency and urgency.  .   Vision sometimes seems blurry but this fluctuates.     Fatigue bothers her daily.   Provigil helps some but incompletely.  Mood has been worse since she found out she wasn't going to be getting copay assistance for Aubagio ________________________________________________ From 02/18/2017:  Pain:   She is experiencing bilateral hip pain and more pain in her hands and feet.     We have done trochanteric bursae injections and bilateral piriformis injections seemed to help the pain for a longer time and pelvic pain is also better.  She has  A labrum tear on the right and has seen ortho.     She also has elbow pain on the right, helped x 2 months by steroid injection at the last visit.   Tramadol and etodolac helps some but not long term.   All the joint pain worsens with use and improves with a heating pad.   She has not been tested for inflammatory arthritis.   Other joints also hurt much of the time.  MS:   She is on Aubagio and she tolerates it well. She has occasional diarrhea.   She could not tolerate Tecfidera and had tolerability issues with Plegridy and Betaseron earlier.   She is not having any definite exacerbation though she has some sensory fluctuations.   The MRI performed January 2018 showed chronic MS lesions but no acute findings.   She also appears to have had a left cerebellar CVA (also chronic)  Vision:   She notes mild blurred vision that is intermittent.     She feels both eyes are involved.   Once occurred while driving and once while watching TV.    She did not  feel symptoms were just from one eye.     Gait/strength/sensation:     She uses a cane for longer distances and if tired but walks without most of the time.  She uses it more when hips act up also.       She notes more muscle fatigue.    She has dysesthesias mostly in the left leg that did not respond well to the medications.     She denies much weakness but has muscle stiffness and pain.   Bladder:   She has urinary frequency and hesitancy.  She is on tamsulosin qod.  Vesicare helped but seemed to help her more last year that this year.    She has seen [facility].  She has a little diarrhea at times.    Fatigue/sleep:   She has fatigue that is worse in the afternoons.    She is back on modafinil with benefit. She has some difficulty with sleep onset insomnia > sleep maintenance, mostly due to pain she thinks.   Vesicare has helped sleep with less nocturia.    She has not been told she snores.    Mood/cognition:    Mood is generslly gfood, occ down with her pain.   She gets frustrated easily.  She is now doing an exercise class out in the community (does sitting down --- it a an arthritis group)..   Cognitively, she has difficulty with short-term memory as well as with verbal fluency  MS History:    In 1994, she had an episode of severe vertigo with gait ataxia. An MRI at that time was reportedly normal. The symptoms were felt to be due to allergies. About 7 years later she had vertigo, gait ataxia and diplopia. Also her left leg was giving out. Short after that she had a hysterectomy. Postoperatively, she was numb in both legs and she had a lumbar puncture which showed changes consistent with MS. Then, an MRI of the brain was performed showing changes consistent with MS. She was referred to Dr. Erling Cruz who her on Betaseron. Last year, she switched from Betaseron to Tecfidera, in the hope that she  would be able to avoid shots as she was having severe skin reactions with knots.    However, she had a lot of difficulty tolerating Tecfidera and swithced to [place] in early 2016 and to Aubagio late 2016    REVIEW OF SYSTEMS:  Constitutional: No fevers, chills, sweats, or change in appetite.   Reports fatigue Eyes: No visual changes, double vision, eye pain Ear, nose and throat: No hearing loss, ear pain, nasal congestion, sore throat Cardiovascular: No chest pain, palpitations Respiratory:  No shortness of breath at rest or with exertion.   No wheezes GastrointestinaI: No nausea, vomiting, diarrhea, abdominal pain, fecal incontinence Genitourinary:  see above. Musculoskeletal:  Pain in left greater than right hip Integumentary: No rash, pruritus.    Neurological: as above Psychiatric: No depression at this time.  No anxiety Endocrine: No palpitations, diaphoresis, change in appetite, change in weigh or increased thirst Hematologic/Lymphatic:  No anemia, purpura, petechiae. Allergic/Immunologic: No itchy/runny eyes, nasal congestion, recent allergic reactions, rashes  ALLERGIES: No Known Allergies  HOME MEDICATIONS: Outpatient Medications Prior to Visit  Medication Sig Dispense Refill  . aspirin 81 MG tablet Take 81 mg by mouth daily.    . calcium carbonate (OS-CAL) 600 MG TABS Take 600 mg by mouth daily.    . cholecalciferol (VITAMIN D) 1000 UNITS tablet Take 1,000 Units by mouth  daily.     . estradiol (CLIMARA - DOSED IN MG/24 HR) 0.0375 mg/24hr patch 1 patch.  11  . fexofenadine (ALLEGRA) 180 MG tablet Take 180 mg by mouth daily.    . Multiple Vitamins-Minerals (MULTIVITAMIN WITH MINERALS) tablet Take 1 tablet by mouth daily.    [name] Kitchen omeprazole (PRILOSEC) 40 MG capsule Take 1 capsule (40 mg total) daily by mouth. 90 capsule 3  . Polyethylene Glycol 3350 (MIRALAX PO) Take by mouth.    . progesterone (PROMETRIUM) 100 MG capsule TAKE 1 CAPSULE BY MOUTH EVERY DAY AT BEDTIME  3  .  traMADol (ULTRAM) 50 MG tablet Take 1 tablet (50 mg total) by mouth 3 (three) times daily as needed. 90 tablet 2  . VESICARE 5 MG tablet TAKE 1 TABLET (5 MG TOTAL) BY MOUTH DAILY. 30 tablet 11  . clonazePAM (KLONOPIN) 0.5 MG tablet TAKE 1 TABLET AT BEDTIME 30 tablet 5  . etodolac (LODINE) 400 MG tablet TAKE 1 TABLET (400 MG TOTAL) BY MOUTH 2 (TWO) TIMES DAILY. 60 tablet 9  . leflunomide (ARAVA) 20 MG tablet Take 1 tablet (20 mg total) by mouth daily. 30 tablet 11  . modafinil (PROVIGIL) 200 MG tablet TAKE 1 TABLET EVERY MORNING AND ONE-HALF TABLET EVERY AFTERNOON 45 tablet 0  . mirabegron ER (MYRBETRIQ) 50 MG TB24 tablet Take 1 tablet (50 mg total) by mouth daily. (Patient not taking: Reported on 10/27/2017) 30 tablet 5  . gabapentin (NEURONTIN) 300 MG capsule Take 1 capsule (300 mg total) by mouth 3 (three) times daily. (Patient not taking: Reported on 10/27/2017) 90 capsule 11  . MIMVEY LO 0.5-0.1 MG per tablet Take 1 tablet by mouth daily.  12   No facility-administered medications prior to visit.     PAST MEDICAL HISTORY: Past Medical History:  Diagnosis Date  . Allergy   . Hot flashes, menopausal 10/13/2011   Estradiol started   . Multiple sclerosis ([place])   . Neuropathy   . Osteoarthritis   . Vision abnormalities     PAST SURGICAL HISTORY: Past Surgical History:  Procedure Laterality Date  . ABDOMINAL HYSTERECTOMY  2002   partial  . LUMBAR FUSION      FAMILY HISTORY: Family History  Problem Relation Age of Onset  . Heart failure Mother   . Heart failure Father   . Arthritis Unknown   . Hypertension Unknown     SOCIAL HISTORY:  Social History   Socioeconomic History  . Marital status: Widowed    Spouse name: Not on file  . Number of children: Not on file  . Years of education: Not on file  . Highest education level: Not on file  Occupational History  . Occupation: retired  Scientific laboratory technician  . Financial resource strain: Not on file  . Food insecurity:    Worry: Not  on file    Inability: Not on file  . Transportation needs:    Medical: Not on file    Non-medical: Not on file  Tobacco Use  . Smoking status: Former Smoker    Packs/day: 0.50    Years: 15.00    Pack years: 7.50    Types: Cigarettes    Last attempt to quit: 08/05/1983    Years since quitting: 34.5  . Smokeless tobacco: Never Used  Substance and Sexual Activity  . Alcohol use: Yes    Alcohol/week: 0.0 oz    Comment: rarely  . Drug use: No  . Sexual activity: Not Currently    Birth control/protection:  Post-menopausal  Lifestyle  . Physical activity:    Days per week: Not on file    Minutes per session: Not on file  . Stress: Not on file  Relationships  . Social connections:    Talks on phone: Not on file    Gets together: Not on file    Attends religious service: Not on file    Active member of club or organization: Not on file    Attends meetings of clubs or organizations: Not on file    Relationship status: Not on file  . Intimate partner violence:    Fear of current or ex partner: Not on file    Emotionally abused: Not on file    Physically abused: Not on file    Forced sexual activity: Not on file  Other Topics Concern  . Not on file  Social History Narrative   Regular Exercise-no   Widowed   Retired              PHYSICAL EXAM  Vitals:   02/19/18 0955  BP: (!) 158/86  Pulse: 71  Weight: 179 lb (81.2 kg)  Height: 5\' 2"  (1.575 m)    Body mass index is 32.74 kg/m.   General: The patient is well-developed and well-nourished and in no acute distress  Musculoskeletal: She has tenderness over the trochanteric bursae, left equals right.  There is also mild lower lumbar paraspinal tenderness.  Neurologic Exam  Mental status: The patient is alert and oriented x 3 at the time of the examination. The patient has apparent normal recent and remote memory, with an apparently normal attention span and concentration ability.   Speech is normal.  Cranial  nerves: Extraocular movements are full.  Facial strength and sensation are normal.  The trapezius strength is normal.  Motor:  Muscle bulk is normal but tone is symmetric, more on the left.. Strength is 4+/5 in intrinsic hand muscles   Sensory: Sensory testing is intact to touch and vibration in the arms.  She has reduced vibration sensation in the feet.  Coordination: Finger-nose-finger and heel-to-shin is performed better on the right than the left.  Gait and station: Station is normal.  The gait is mildly wide and arthritic.  The tandem gait is moderately wide.   Romberg is negative.   Reflexes: Deep tendon reflexes are symmetric and normal bilaterally.      DIAGNOSTIC DATA (LABS, IMAGING, TESTING) - I reviewed patient records, labs, notes, testing and imaging myself where available.  Lab Results  Component Value Date   WBC 5.1 08/12/2017   HGB 13.7 08/12/2017   HCT 41.5 08/12/2017   MCV 90 08/12/2017   PLT 226 08/12/2017      Component Value Date/Time   NA 144 08/12/2017 0917   K 4.5 08/12/2017 0917   CL 104 08/12/2017 0917   CO2 23 08/12/2017 0917   GLUCOSE 86 08/12/2017 0917   GLUCOSE 82 11/03/2011 0920   BUN 14 08/12/2017 0917   CREATININE 0.69 08/12/2017 0917   CALCIUM 9.8 08/12/2017 0917   PROT 6.3 11/13/2017 1003   PROT 6.5 08/12/2017 0917   ALBUMIN 4.6 08/12/2017 0917   AST 21 08/12/2017 0917   ALT 26 08/12/2017 0917   ALKPHOS 131 (H) 08/12/2017 0917   BILITOT 0.4 08/12/2017 0917   GFRNONAA 89 08/12/2017 0917   GFRAA 103 08/12/2017 0917   Lab Results  Component Value Date   CHOL 203 (H) 11/03/2011   HDL 44.60 11/03/2011   LDLCALC 136 (  H) 12/05/2009   LDLDIRECT 130.2 11/03/2011   TRIG 185.0 (H) 11/03/2011   CHOLHDL 5 11/03/2011   No results found for: HGBA1C No results found for: VITAMINB12 Lab Results  Component Value Date   TSH 3.80 11/03/2011       ASSESSMENT AND PLAN  MULTIPLE SCLEROSIS, PROGRESSIVE/RELAPSING  Chronic  fatigue  Urinary frequency  Abnormality of gait  Trochanteric bursitis of right hip  Trochanteric bursitis of left hip  Abnormal SPEP - Plan: Ambulatory referral to Hematology  Multiple joint pain   1.    She will continue leflunomide for MS. 2.     Bilateral trochanteric bursae were injected with a total of 5 mL Marcaine containing 80 mg Depo-Medrol using sterile technique. She tolerated the procedure well and there were no complications.  3.     When she saw orthopedics, she had an MRI of the lumbar spine performed which showed a lesion in the L1 vertebral body.  Of note, MRI from 2001 also showed a lesion that was felt to be an atypical hemangioma within the L1 vertebral body.  Therefore, they are probably the same, though I do not have the actual films to compare. 4.     Based on the abnormal MRI, she had additional testing including SPEP/IEF.  It was abnormal with a probable paraprotein and she will be referred to hematology for evaluation of possible MGUS/MM.   5.      Return to see me in about 6 months but is advised to call sooner if she has new or worsening neurologic symptoms.    Jevonte Clanton A. Felecia Shelling, MD, PhD 01/22/517, 3:35 PM Certified in Neurology, Clinical Neurophysiology, Sleep Medicine, Pain Medicine and Neuroimaging  [facility] [address] [place], [place] [address] [phone]-' \

## 2018-02-22 ENCOUNTER — Encounter: Payer: Self-pay | Admitting: Hematology

## 2018-02-22 ENCOUNTER — Telehealth: Payer: Self-pay | Admitting: Hematology

## 2018-02-22 NOTE — Telephone Encounter (Signed)
New referral received from Dr. Felecia Shelling at [facility] for abnl spep. Pt has been cld and scheduled to see Dr. Burr Medico on 8/20 at 1230pm. Pt aware to arrive 30 minutes early. Letter mailed.

## 2018-03-01 ENCOUNTER — Encounter: Payer: Self-pay | Admitting: Physical Therapy

## 2018-03-01 ENCOUNTER — Ambulatory Visit: Payer: Medicare Other | Attending: Orthopaedic Surgery | Admitting: Physical Therapy

## 2018-03-01 DIAGNOSIS — M545 Low back pain: Secondary | ICD-10-CM | POA: Insufficient documentation

## 2018-03-01 DIAGNOSIS — R252 Cramp and spasm: Secondary | ICD-10-CM | POA: Diagnosis not present

## 2018-03-01 DIAGNOSIS — G8929 Other chronic pain: Secondary | ICD-10-CM | POA: Diagnosis not present

## 2018-03-01 DIAGNOSIS — R262 Difficulty in walking, not elsewhere classified: Secondary | ICD-10-CM | POA: Diagnosis not present

## 2018-03-01 DIAGNOSIS — M6281 Muscle weakness (generalized): Secondary | ICD-10-CM | POA: Diagnosis not present

## 2018-03-01 NOTE — Therapy (Signed)
[place], [place], [address] Phone: [phone]   Fax:  [phone]  Physical Therapy Treatment  Patient Details  Name: DEMITA TOBIA MRN: [id number] Date of Birth: 1946/01/02 Referring Provider: Dr Joni Fears    Encounter Date: 03/01/2018  PT End of Session - 03/01/18 1559    Visit Number  7    Number of Visits  16    Date for PT Re-Evaluation  02/26/18    Authorization Type  Medicare     PT Start Time  1417    PT Stop Time  1500    PT Time Calculation (min)  43 min    Activity Tolerance  Patient tolerated treatment well    Behavior During Therapy  [facility] for tasks assessed/performed       Past Medical History:  Diagnosis Date  . Allergy   . Hot flashes, menopausal 10/13/2011   Estradiol started   . Multiple sclerosis ([place])   . Neuropathy   . Osteoarthritis   . Vision abnormalities     Past Surgical History:  Procedure Laterality Date  . ABDOMINAL HYSTERECTOMY  2002   partial  . LUMBAR FUSION      There were no vitals filed for this visit.  Subjective Assessment - 03/01/18 1451    Subjective  Patient is having increased pain in her hips today. A few nights ago the pain kept her up at night. She was able to use some of her stretches to decreasee the pain.     Limitations  Standing;Walking;House hold activities    How long can you stand comfortably?  varys on the time of day and activity     How long can you walk comfortably?  Can walk through the grocery store with a cart     Diagnostic tests  MRI: Multi level degeneration and     Currently in Pain?  Yes    Pain Score  7     Pain Location  Hip    Pain Orientation  Right;Left    Pain Descriptors / Indicators  Aching    Pain Type  Chronic pain    Pain Onset  More than a month ago    Pain Frequency  Constant    Aggravating Factors   walking     Pain Relieving Factors  stretching     Effect of Pain on Daily Activities  difficulty  perfroming daily tasks                        [facility] Adult PT Treatment/Exercise - 03/01/18 0001      Lumbar Exercises: Stretches   Passive Hamstring Stretch  Limitations;2 reps;20 seconds    Passive Hamstring Stretch Limitations  seated     Lower Trunk Rotation Limitations  x10 in pain free ranges     Other Lumbar Stretch Exercise  glute stretch 2x20 sec each leg. Careful to nbot hurt the hip on the right      Lumbar Exercises: Supine   AB Set Limitations  reviewed breathing with exercises     Clam Limitations  2x10 red     Bent Knee Raise Limitations  2x10     Other Supine Lumbar Exercises  ball squeeze 2x10       Manual Therapy   Manual Therapy  Soft tissue mobilization    Manual therapy comments  Gentle LAD bilateral     Soft tissue mobilization  to upper glutes  and bilateral lumbar paraspinals using IASTYM;        Trigger Point Dry Needling - 03/01/18 1607    Consent Given?  Yes    Gluteus Minimus Response  Twitch response elicited   glut medius on left and right           PT Education - 03/01/18 1559    Education Details  reviewed stretching and ther-ex to help with trochanteric bursitis     Person(s) Educated  Patient    Methods  Explanation;Demonstration;Tactile cues;Verbal cues    Comprehension  Verbalized understanding;Returned demonstration;Tactile cues required;Verbal cues required;Need further instruction       PT Short Term Goals - 03/01/18 1610      PT SHORT TERM GOAL #1   Title  Patient will increase right passive hip flexion to 105 degrees without pain     Baseline  no pain 105    Time  4    Period  Weeks    Status  Achieved      PT SHORT TERM GOAL #2   Title  Patient will increase gross bilateral lower extremity strength to 4+/5     Baseline  4/5 hip flexion 4+/5 hip abduction     Time  4    Period  Weeks    Status  On-going      PT SHORT TERM GOAL #3   Title  Patient will be independent with basic HEP     Baseline   independnet with exercises given to her at this time     Time  4    Period  Weeks    Status  Achieved        PT Long Term Goals - 12/02/17 1450      PT LONG TERM GOAL #1   Title  Patient will stand for 1 hour without self reported increase pain in order to stand at a kitchen sink.    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 Therapy teviewd the potential causes of torvchanteric bursitis and how the exercises can help. Therapy reviewed hip stretching and strengthening,.    Clinical Presentation  Evolving    Clinical Decision Making  High    Rehab Potential  Good    PT Frequency  2x / week    PT Duration  8 weeks    PT Treatment/Interventions  ADLs/Self Care Home Management;Cryotherapy;Electrical Stimulation;Ultrasound;Moist Heat;Iontophoresis 4mg /ml Dexamethasone;Stair training;Functional mobility training;Therapeutic activities;Therapeutic exercise;Manual techniques;Passive range of motion;Dry needling;Taping    PT Next Visit Plan  continue to review exercise. Consider standing exercises.  PT Home Exercise Plan  seated core breathing with scap retraction; seated posterior pelvic tilt; tennis ball trigger point release    Consulted and Agree with Plan of Care  Patient       Patient will benefit from skilled therapeutic intervention in order to improve the following deficits and impairments:  Difficulty walking, Decreased activity tolerance,  Decreased endurance, Decreased strength, Decreased range of motion, Decreased mobility, Pain, Increased fascial restricitons  Visit Diagnosis: Chronic bilateral low back pain without sciatica  Muscle weakness (generalized)  Cramp and spasm  Difficulty in walking, not elsewhere classified     Problem List Patient Active Problem List   Diagnosis Date Noted  . Abnormal SPEP 02/19/2018  . Hand pain 02/20/2017  . Multiple joint pain 02/18/2017  . Right elbow pain 12/09/2016  . Disturbed cognition 06/25/2016  . Trochanteric bursitis of left hip 02/18/2016  . Sciatica, right side 10/30/2015  . Bilateral arm pain 10/10/2015  . Trochanteric bursitis of right hip 08/04/2014  . Adjustment disorder with mixed anxiety and depressed mood 08/04/2014  . Chronic fatigue 08/04/2014  . Urinary frequency 08/04/2014  . Abnormality of gait 08/04/2014  . Unspecified visual disturbance 01/31/2013  . Routine general medical examination at a health care facility 11/04/2011  . Colon polyps 11/03/2011  . Hot flashes, menopausal 10/13/2011  . POSTHERPETIC NEURALGIA 10/03/2009  . DISPLCMT LUMBAR INTERVERT [place] W/O MYELOPATHY 09/05/2009  . RENAL CALCULUS, RECURRENT 09/04/2009  . HYPERLIPIDEMIA 08/31/2009  . MULTIPLE SCLEROSIS, PROGRESSIVE/RELAPSING 08/31/2009  . ALLERGIC RHINITIS 08/31/2009    Carney Living PT DPT 03/01/2018, 4:12 PM  [facility] [address] [place], [place], [address] Phone: [phone]   Fax:  [phone]  Name: JULIANNAH OHMANN MRN: [id number] Date of Birth: Sep 28, 1945

## 2018-03-02 ENCOUNTER — Other Ambulatory Visit: Payer: Self-pay | Admitting: Neurology

## 2018-03-02 NOTE — Telephone Encounter (Signed)
Refill for Vesicare submitted to [facility] [place].  MB RN

## 2018-03-05 NOTE — Progress Notes (Signed)
[place]  [phone] [phone]  Clinic New consult Note   Patient Care Team: Britt Bottom, MD as PCP - General (Neurology) 03/05/2018  Referring Physician: Dr. Durward Fortes  CHIEF COMPLAINTS/PURPOSE OF CONSULTATION:  Abnormal SPEP and IFE  HISTORY OF PRESENTING ILLNESS:  Dawn [name] 72 y.o. female is here because of abnormal lab results. She was referred by Dr. Durward Fortes due to abnormal SPEP.  She presents to clinic by herself.   She has long-standing history of back pain since 2001, and had lumbar fusion in the past.  Her back pain has gradually getting worse in the past year, and she also developed multiple arthralgia.  She has been follow-up with her orthopedic surgeon Dr. Durward Fortes. Spine MRI on 11/10/2017 at [facility] showed a L1 vertebral lesion, appears to be a hemangioma but multiple myeloma was not ruled out.  SPEP and immunofixation was ordered, which showed a poorly defined band of restricted protein mobility in the gamma globulins was detected and is reactive with lambda light chain.  She was referred to [place] for further evaluation.  She was also was multiple sclerosis in 2001, on [place]. She follows up with Dr. Felecia Shelling.   MEDICAL HISTORY:  Past Medical History:  Diagnosis Date  . Allergy   . Hot flashes, menopausal 10/13/2011   Estradiol started   . Multiple sclerosis ([place])   . Neuropathy   . Osteoarthritis   . Vision abnormalities     SURGICAL HISTORY: Past Surgical History:  Procedure Laterality Date  . ABDOMINAL HYSTERECTOMY  2002   partial  . LUMBAR FUSION      SOCIAL HISTORY: Social History   Socioeconomic History  . Marital status: Widowed    Spouse name: Not on file  . Number of children: Not on file  . Years of education: Not on file  . Highest education level: Not on file  Occupational History  . Occupation: retired  Scientific laboratory technician  . Financial resource strain: Not on file  . Food insecurity:   Worry: Not on file    Inability: Not on file  . Transportation needs:    Medical: Not on file    Non-medical: Not on file  Tobacco Use  . Smoking status: Former Smoker    Packs/day: 0.50    Years: 15.00    Pack years: 7.50    Types: Cigarettes    Last attempt to quit: 08/05/1983    Years since quitting: 34.6  . Smokeless tobacco: Never Used  Substance and Sexual Activity  . Alcohol use: Yes    Alcohol/week: 0.0 standard drinks    Comment: rarely  . Drug use: No  . Sexual activity: Not Currently    Birth control/protection: Post-menopausal  Lifestyle  . Physical activity:    Days per week: Not on file    Minutes per session: Not on file  . Stress: Not on file  Relationships  . Social connections:    Talks on phone: Not on file    Gets together: Not on file    Attends religious service: Not on file    Active member of club or organization: Not on file    Attends meetings of clubs or organizations: Not on file    Relationship status: Not on file  . Intimate partner violence:    Fear of current or ex partner: Not on file    Emotionally abused: Not on file    Physically abused: Not on file    Forced  sexual activity: Not on file  Other Topics Concern  . Not on file  Social History Narrative   Regular Exercise-no   Widowed   Retired             FAMILY HISTORY: Family History  Problem Relation Age of Onset  . Heart failure Mother   . Heart failure Father   . Arthritis Unknown   . Hypertension Unknown     ALLERGIES:  has No Known Allergies.  MEDICATIONS:  Current Outpatient Medications  Medication Sig Dispense Refill  . aspirin 81 MG tablet Take 81 mg by mouth daily.    . calcium carbonate (OS-CAL) 600 MG TABS Take 600 mg by mouth daily.    . cholecalciferol (VITAMIN D) 1000 UNITS tablet Take 1,000 Units by mouth daily.     . clonazePAM (KLONOPIN) 0.5 MG tablet Take 1 tablet (0.5 mg total) by mouth at bedtime. 30 tablet 5  . estradiol (CLIMARA - DOSED IN  MG/24 HR) 0.0375 mg/24hr patch 1 patch.  11  . etodolac (LODINE) 400 MG tablet Take 1 tablet (400 mg total) by mouth 2 (two) times daily. 60 tablet 9  . fexofenadine (ALLEGRA) 180 MG tablet Take 180 mg by mouth daily.    [name] Kitchen gabapentin (NEURONTIN) 300 MG capsule Take 1 capsule (300 mg total) by mouth 3 (three) times daily. 90 capsule 11  . leflunomide (ARAVA) 20 MG tablet Take 1 tablet (20 mg total) by mouth daily. 30 tablet 11  . mirabegron ER (MYRBETRIQ) 50 MG TB24 tablet Take 1 tablet (50 mg total) by mouth daily. (Patient not taking: Reported on 10/27/2017) 30 tablet 5  . modafinil (PROVIGIL) 200 MG tablet TAKE 1 TABLET EVERY MORNING AND ONE-HALF TABLET EVERY AFTERNOON 45 tablet 5  . Multiple Vitamins-Minerals (MULTIVITAMIN WITH MINERALS) tablet Take 1 tablet by mouth daily.    [name] Kitchen omeprazole (PRILOSEC) 40 MG capsule Take 1 capsule (40 mg total) daily by mouth. 90 capsule 3  . oxybutynin (DITROPAN) 5 MG tablet Take 1 tablet (5 mg total) by mouth 2 (two) times daily. 60 tablet 11  . Polyethylene Glycol 3350 (MIRALAX PO) Take by mouth.    . progesterone (PROMETRIUM) 100 MG capsule TAKE 1 CAPSULE BY MOUTH EVERY DAY AT BEDTIME  3  . traMADol (ULTRAM) 50 MG tablet Take 1 tablet (50 mg total) by mouth 3 (three) times daily as needed. 90 tablet 2  . VESICARE 5 MG tablet TAKE 1 TABLET (5 MG TOTAL) BY MOUTH DAILY. 30 tablet 11   No current facility-administered medications for this visit.     REVIEW OF SYSTEMS:   Constitutional: Denies fevers, chills or abnormal night sweats, no weight loss  Eyes: Denies blurriness of vision, double vision or watery eyes Ears, nose, mouth, throat, and face: Denies mucositis or sore throat Respiratory: Denies cough, dyspnea or wheezes Cardiovascular: Denies palpitation, chest discomfort or lower extremity swelling Gastrointestinal:  Denies nausea, heartburn or change in bowel habits Skin: Denies abnormal skin rashes Lymphatics: Denies new lymphadenopathy or easy  bruising Neurological: Chronic low back and hip pain Behavioral/Psych: Mood is stable, no new changes  All other systems were reviewed with the patient and are negative.  PHYSICAL EXAMINATION: ECOG PERFORMANCE STATUS: 2 - Symptomatic, <50% confined to bed  Vitals:   03/09/18 1250  BP: (!) 165/64  Pulse: 64  Resp: 14  Temp: 98.4 F (36.9 C)  SpO2: 99%   Filed Weights   03/09/18 1250  Weight: 181 lb 11.2 oz (82.4 kg)  GENERAL:alert, no distress and comfortable SKIN: skin color, texture, turgor are normal, no rashes or significant lesions EYES: normal, conjunctiva are pink and non-injected, sclera clear OROPHARYNX:no exudate, no erythema and lips, buccal mucosa, and tongue normal  NECK: supple, thyroid normal size, non-tender, without nodularity LYMPH:  no palpable lymphadenopathy in the cervical, axillary or inguinal LUNGS: clear to auscultation and percussion with normal breathing effort HEART: regular rate & rhythm and no murmurs and no lower extremity edema ABDOMEN:abdomen soft, non-tender and normal bowel sounds Musculoskeletal:no cyanosis of digits and no clubbing  PSYCH: alert & oriented x 3 with fluent speech NEURO: no focal motor/sensory deficits  LABORATORY DATA:  I have reviewed the data as listed CBC Latest Ref Rng & Units 08/12/2017 02/18/2017 02/18/2016  WBC 3.4 - 10.8 x10E3/uL 5.1 4.6 5.0  Hemoglobin 11.1 - 15.9 g/dL 13.7 13.9 13.8  Hematocrit 34.0 - 46.6 % 41.5 42.8 41.8  Platelets 150 - 379 x10E3/uL 226 185 209    CMP Latest Ref Rng & Units 11/13/2017 08/12/2017 02/18/2017  Glucose 65 - 99 mg/dL - 86 -  BUN 8 - 27 mg/dL - 14 -  Creatinine 0.57 - 1.00 mg/dL - 0.69 -  Sodium 134 - 144 mmol/L - 144 -  Potassium 3.5 - 5.2 mmol/L - 4.5 -  Chloride 96 - 106 mmol/L - 104 -  CO2 20 - 29 mmol/L - 23 -  Calcium 8.7 - 10.3 mg/dL - 9.8 -  Total Protein 6.1 - 8.1 g/dL 6.3 6.5 6.2  Total Bilirubin 0.0 - 1.2 mg/dL - 0.4 0.3  Alkaline Phos 39 - 117 IU/L - 131(H) 113    AST 0 - 40 IU/L - 21 18  ALT 0 - 32 IU/L - 26 14     RADIOGRAPHIC STUDIES: I have personally reviewed the radiological images as listed and agreed with the findings in the report.  11/10/2017 MRI Spine (at [facility]) IMPRESSION: 1.Multilevel lumbar disc degenerative change and facet arthrosis as detailed. There is mild canal stenosis at L2-3 and L3-4. There is moderate right foraminal stenosis at L3-4 with otherwise mild foraminal stenoses.  2.Lesion of the L1 vertebral body. This is most likely a benign hemangioma with atypical signal however strictly cannot exclude other etiology including myeloma. Correlate with pertinent labs and any available prior imaging. Otherwise consider a 6  month follow-up to assure stability.  ASSESSMENT & PLAN: . TAHLOR BERENGUER is a 72 y.o. female with history of multiple sclerosis and chronic back pain without sciatica.  1. Abnormal SPEP/IFE -She was referred by Dr. Durward Fortes due to abnormal SPEP found after a spine MRI on 11/10/2017 at [facility] which showed a L1 vertebral lesion, likely a hemangioma, but multiple myeloma was not ruled out. SPEP and immunofixation was subsequently obtained, which showed a poorly defined area of restricted to protein mobility which is reactive with lambda light chain. -Recent labs reviewed, she has no evidence of anemia, or other cytopenia, no hypercalcemia or abnormal renal function. -I think this is unlikely MGUS or multiple myeloma, but it would repeat SPEP with immunofixation, light chain level, and 24-hour urine UPEP with IFE to rule out  -If the above lab work is negative, I do not think she needs further work up.   2. MS -She sees Dr. Felecia Shelling for MS  -She is on leflunomide  3. Chronic Back Pain -She sees Dr. Durward Fortes and continue PT   Plan -will repeat lab today including CBC, CMP, SPEP with immunofixation, light chain and  immunoglobulin level, 24-hour urine UPEP -I will call her when the above lab  results return.  If negative, no need to follow-up with me.   All questions were answered. The patient knows to call the clinic with any problems, questions or concerns. I spent 30 minutes counseling the patient face to face. The total time spent in the appointment was 40 minutes and more than 50% was on counseling.  Dierdre Searles Dweik am acting as scribe for Dr. Truitt Merle.  I have reviewed the above documentation for accuracy and completeness, and I agree with the above.      Truitt Merle, MD 03/05/2018  3:02 PM

## 2018-03-08 ENCOUNTER — Ambulatory Visit: Payer: Medicare Other | Admitting: Physical Therapy

## 2018-03-08 ENCOUNTER — Encounter: Payer: Self-pay | Admitting: Physical Therapy

## 2018-03-08 DIAGNOSIS — R252 Cramp and spasm: Secondary | ICD-10-CM | POA: Diagnosis not present

## 2018-03-08 DIAGNOSIS — G8929 Other chronic pain: Secondary | ICD-10-CM

## 2018-03-08 DIAGNOSIS — R262 Difficulty in walking, not elsewhere classified: Secondary | ICD-10-CM | POA: Diagnosis not present

## 2018-03-08 DIAGNOSIS — M545 Low back pain: Secondary | ICD-10-CM | POA: Diagnosis not present

## 2018-03-08 DIAGNOSIS — M6281 Muscle weakness (generalized): Secondary | ICD-10-CM

## 2018-03-08 NOTE — Therapy (Signed)
[place], [place], [address] Phone: [phone]   Fax:  [phone]  Physical Therapy Treatment   Patient Details  Name: Dawn [name] MRN: [id number] Date of Birth: 27-May-1946 Referring Provider: Dr Joni Fears    Encounter Date: 03/08/2018  PT End of Session - 03/08/18 1357    Visit Number  8    Number of Visits  16    Date for PT Re-Evaluation  02/26/18    Authorization Type  Medicare     PT Start Time  1100    PT Stop Time  1143    PT Time Calculation (min)  43 min    Activity Tolerance  Patient tolerated treatment well    Behavior During Therapy  [facility] for tasks assessed/performed       Past Medical History:  Diagnosis Date  . Allergy   . Hot flashes, menopausal 10/13/2011   Estradiol started   . Multiple sclerosis ([place])   . Neuropathy   . Osteoarthritis   . Vision abnormalities     Past Surgical History:  Procedure Laterality Date  . ABDOMINAL HYSTERECTOMY  2002   partial  . LUMBAR FUSION      There were no vitals filed for this visit.  Subjective Assessment - 03/08/18 1106    Subjective  Patient reports the pain in her lateral hip is improving. She was a little sore in her quads. she was able to get outfor a walk over the weekend.     Limitations  Standing;Walking;House hold activities    How long can you stand comfortably?  varys on the time of day and activity     How long can you walk comfortably?  Can walk through the grocery store with a cart     Currently in Pain?  Yes    Pain Score  5     Pain Location  Hip    Pain Orientation  Right;Left    Pain Descriptors / Indicators  Aching    Pain Type  Chronic pain    Pain Onset  More than a month ago    Pain Frequency  Constant    Aggravating Factors   walking     Pain Relieving Factors  stretching     Effect of Pain on Daily Activities  difficulty perfroming daily tasks                        [facility]  Adult PT Treatment/Exercise - 03/08/18 0001      Lumbar Exercises: Stretches   Lower Trunk Rotation Limitations  x10 in pain free ranges     Other Lumbar Stretch Exercise  glute stretch 2x20 sec each leg. Careful to nbot hurt the hip on the right      Lumbar Exercises: Supine   AB Set Limitations  reviewed breathing with exercises     Clam Limitations  2x10 green     Bent Knee Raise Limitations  2x10     Other Supine Lumbar Exercises  ball squeeze 2x10       Manual Therapy   Manual Therapy  Soft tissue mobilization    Manual therapy comments  Gentle LAD bilateral     Soft tissue mobilization  to upper glutes and bilateral lumbar paraspinals using IASTYM;        Trigger Point Dry Needling - 03/08/18 1343    Consent Given?  Yes    Longissimus Response  Twitch response  elicited    Gluteus Minimus Response  Twitch response elicited   of glut medius           PT Education - 03/08/18 1349    Education Details  reviewed benefits and risks of TPDN     Person(s) Educated  Patient    Methods  Explanation;Demonstration;Tactile cues;Verbal cues    Comprehension  Verbalized understanding;Returned demonstration;Verbal cues required;Tactile cues required       PT Short Term Goals - 03/01/18 1610      PT SHORT TERM GOAL #1   Title  Patient will increase right passive hip flexion to 105 degrees without pain     Baseline  no pain 105    Time  4    Period  Weeks    Status  Achieved      PT SHORT TERM GOAL #2   Title  Patient will increase gross bilateral lower extremity strength to 4+/5     Baseline  4/5 hip flexion 4+/5 hip abduction     Time  4    Period  Weeks    Status  On-going      PT SHORT TERM GOAL #3   Title  Patient will be independent with basic HEP     Baseline  independnet with exercises given to her at this time     Time  4    Period  Weeks    Status  Achieved        PT Long Term Goals - 12/02/17 1450      PT LONG TERM GOAL #1   Title  Patient will stand  for 1 hour without self reported increase pain in order to stand at a kitchen sink.    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 She is more aware of how she is walking. She is having less pain with ambualtion. She trolerated dry needling well.     Rehab Potential  Good    PT Frequency  2x / week    PT Duration  8 weeks    PT Treatment/Interventions  ADLs/Self Care Home Management;Cryotherapy;Electrical Stimulation;Ultrasound;Moist Heat;Iontophoresis 4mg /ml Dexamethasone;Stair training;Functional mobility training;Therapeutic activities;Therapeutic exercise;Manual techniques;Passive range of motion;Dry needling;Taping    PT Next Visit Plan  continue to review exercise. Consider standing exercises.     PT Home Exercise Plan  seated core breathing with scap retraction; seated posterior pelvic tilt; tennis ball trigger point release    Consulted and Agree with Plan of Care  Patient       Patient will benefit from skilled therapeutic intervention in order to improve the following deficits and impairments:  Difficulty walking,  Decreased activity tolerance, Decreased endurance, Decreased strength, Decreased range of motion, Decreased mobility, Pain, Increased fascial restricitons  Visit Diagnosis: Chronic bilateral low back pain without sciatica  Muscle weakness (generalized)  Cramp and spasm  Difficulty in walking, not elsewhere classified     Problem List Patient Active Problem List   Diagnosis Date  Noted  . Abnormal SPEP 02/19/2018  . Hand pain 02/20/2017  . Multiple joint pain 02/18/2017  . Right elbow pain 12/09/2016  . Disturbed cognition 06/25/2016  . Trochanteric bursitis of left hip 02/18/2016  . Sciatica, right side 10/30/2015  . Bilateral arm pain 10/10/2015  . Trochanteric bursitis of right hip 08/04/2014  . Adjustment disorder with mixed anxiety and depressed mood 08/04/2014  . Chronic fatigue 08/04/2014  . Urinary frequency 08/04/2014  . Abnormality of gait 08/04/2014  . Unspecified visual disturbance 01/31/2013  . Routine general medical examination at a health care facility 11/04/2011  . Colon polyps 11/03/2011  . Hot flashes, menopausal 10/13/2011  . POSTHERPETIC NEURALGIA 10/03/2009  . 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 RENAL CALCULUS, RECURRENT 09/04/2009  . HYPERLIPIDEMIA 08/31/2009  . MULTIPLE SCLEROSIS, PROGRESSIVE/RELAPSING 08/31/2009  . ALLERGIC RHINITIS 08/31/2009    Carney Living  PT DPT  03/08/2018, 4:05 PM  [facility] [address] [place], [place], [address] Phone: [phone]   Fax:  [phone]  Name: Dawn [name] MRN: [id number] Date of Birth: 02/22/1946

## 2018-03-09 ENCOUNTER — Inpatient Hospital Stay: Payer: Medicare Other | Attending: Hematology | Admitting: Hematology

## 2018-03-09 ENCOUNTER — Inpatient Hospital Stay: Payer: Medicare Other

## 2018-03-09 ENCOUNTER — Telehealth: Payer: Self-pay | Admitting: Hematology

## 2018-03-09 ENCOUNTER — Encounter: Payer: Self-pay | Admitting: Hematology

## 2018-03-09 VITALS — BP 165/64 | HR 64 | Temp 98.4°F | Resp 14 | Ht 62.0 in | Wt 181.7 lb

## 2018-03-09 DIAGNOSIS — G8929 Other chronic pain: Secondary | ICD-10-CM | POA: Diagnosis not present

## 2018-03-09 DIAGNOSIS — G35 Multiple sclerosis: Secondary | ICD-10-CM | POA: Diagnosis not present

## 2018-03-09 DIAGNOSIS — M549 Dorsalgia, unspecified: Secondary | ICD-10-CM

## 2018-03-09 DIAGNOSIS — R778 Other specified abnormalities of plasma proteins: Secondary | ICD-10-CM

## 2018-03-09 DIAGNOSIS — R7989 Other specified abnormal findings of blood chemistry: Secondary | ICD-10-CM | POA: Diagnosis not present

## 2018-03-09 LAB — CMP (CANCER CENTER ONLY)
ALBUMIN: 3.8 g/dL (ref 3.5–5.0)
ALT: 22 U/L (ref 0–44)
AST: 20 U/L (ref 15–41)
Alkaline Phosphatase: 119 U/L (ref 38–126)
Anion gap: 7 (ref 5–15)
BILIRUBIN TOTAL: 0.4 mg/dL (ref 0.3–1.2)
BUN: 20 mg/dL (ref 8–23)
CO2: 26 mmol/L (ref 22–32)
Calcium: 9.2 mg/dL (ref 8.9–10.3)
Chloride: 107 mmol/L (ref 98–111)
Creatinine: 0.75 mg/dL (ref 0.44–1.00)
GFR, Est AFR Am: >60 mL/min (ref >60)
GFR, Estimated: >60 mL/min (ref >60)
GLUCOSE: 88 mg/dL (ref 70–99)
POTASSIUM: 4.1 mmol/L (ref 3.5–5.1)
Sodium: 140 mmol/L (ref 135–145)
TOTAL PROTEIN: 6.5 g/dL (ref 6.5–8.1)

## 2018-03-09 LAB — CBC WITH DIFFERENTIAL (CANCER CENTER ONLY)
BASOS ABS: 0.1 10*3/uL (ref 0.0–0.1)
BASOS PCT: 1 %
Eosinophils Absolute: 0.1 10*3/uL (ref 0.0–0.5)
Eosinophils Relative: 2 %
HEMATOCRIT: 41.3 % (ref 34.8–46.6)
HEMOGLOBIN: 13.6 g/dL (ref 11.6–15.9)
Lymphocytes Relative: 12 %
Lymphs Abs: 0.6 10*3/uL — ABNORMAL LOW (ref 0.9–3.3)
MCH: 29.4 pg (ref 25.1–34.0)
MCHC: 32.8 g/dL (ref 31.5–36.0)
MCV: 89.5 fL (ref 79.5–101.0)
MONO ABS: 0.3 10*3/uL (ref 0.1–0.9)
Monocytes Relative: 7 %
NEUTROS ABS: 3.9 10*3/uL (ref 1.5–6.5)
NEUTROS PCT: 78 %
Platelet Count: 171 10*3/uL (ref 145–400)
RBC: 4.61 MIL/uL (ref 3.70–5.45)
RDW: 15.3 % — AB (ref 11.2–14.5)
WBC: 4.9 10*3/uL (ref 3.9–10.3)

## 2018-03-09 NOTE — Telephone Encounter (Signed)
Appts scheduled AVS printed per 8/20 los

## 2018-03-11 ENCOUNTER — Encounter: Payer: Self-pay | Admitting: Hematology

## 2018-03-11 DIAGNOSIS — G8929 Other chronic pain: Secondary | ICD-10-CM | POA: Diagnosis not present

## 2018-03-11 DIAGNOSIS — G35 Multiple sclerosis: Secondary | ICD-10-CM | POA: Diagnosis not present

## 2018-03-11 DIAGNOSIS — R7989 Other specified abnormal findings of blood chemistry: Secondary | ICD-10-CM | POA: Diagnosis not present

## 2018-03-11 DIAGNOSIS — M549 Dorsalgia, unspecified: Secondary | ICD-10-CM | POA: Diagnosis not present

## 2018-03-11 LAB — MULTIPLE MYELOMA PANEL, SERUM
Albumin SerPl Elph-Mcnc: 3.7 g/dL (ref 2.9–4.4)
Albumin/Glob SerPl: 1.8 — ABNORMAL HIGH (ref 0.7–1.7)
Alpha 1: 0.2 g/dL (ref 0.0–0.4)
Alpha2 Glob SerPl Elph-Mcnc: 0.6 g/dL (ref 0.4–1.0)
B-GLOBULIN SERPL ELPH-MCNC: 1 g/dL (ref 0.7–1.3)
Gamma Glob SerPl Elph-Mcnc: 0.4 g/dL (ref 0.4–1.8)
Globulin, Total: 2.1 g/dL — ABNORMAL LOW (ref 2.2–3.9)
IGA: 72 mg/dL — AB (ref 87–352)
IgG (Immunoglobin G), Serum: 512 mg/dL — ABNORMAL LOW (ref 700–1600)
IgM (Immunoglobulin M), Srm: 18 mg/dL — ABNORMAL LOW (ref 26–217)
TOTAL PROTEIN ELP: 5.8 g/dL — AB (ref 6.0–8.5)

## 2018-03-12 LAB — UIFE/LIGHT CHAINS/TP QN, 24-HR UR
FR KAPPA LT CH,24HR: 17 mg/(24.h)
FR LAMBDA LT CH,24HR: 1 mg/(24.h)
FREE KAPPA LT CHAINS, UR: 8.33 mg/L (ref 1.35–24.19)
Free Kappa/Lambda Ratio: 24.5 — ABNORMAL HIGH (ref 2.04–10.37)
Free Lambda Lt Chains,Ur: 0.34 mg/L (ref 0.24–6.66)
TOTAL PROTEIN, URINE-UR/DAY: 116 mg/(24.h) (ref 30–150)
Total Protein, Urine: 5.8 mg/dL
Total Volume: 2000

## 2018-03-15 ENCOUNTER — Telehealth: Payer: Self-pay

## 2018-03-15 NOTE — Telephone Encounter (Signed)
-----  Message from Truitt Merle, MD sent at 03/14/2018  9:28 PM EDT ----- Please let pt know the lab results. I have reviewed her blood and urine test results, all negative, I do not think she has evidence of MGUS or multiple myeloma, no need for follow up. Thanks   Truitt Merle  03/14/2018

## 2018-03-15 NOTE — Telephone Encounter (Signed)
Spoke with patient per Dr. Burr Medico with lab results which were negative, there is no evidence of MGUS or multiple myeloma, patient verbalized an understanding.  Informed patient no need to follow up with Dr. Burr Medico

## 2018-03-23 ENCOUNTER — Ambulatory Visit: Payer: Medicare Other | Attending: Orthopaedic Surgery | Admitting: Physical Therapy

## 2018-03-23 ENCOUNTER — Encounter: Payer: Self-pay | Admitting: Physical Therapy

## 2018-03-23 DIAGNOSIS — R252 Cramp and spasm: Secondary | ICD-10-CM | POA: Diagnosis not present

## 2018-03-23 DIAGNOSIS — M545 Low back pain, unspecified: Secondary | ICD-10-CM

## 2018-03-23 DIAGNOSIS — R262 Difficulty in walking, not elsewhere classified: Secondary | ICD-10-CM

## 2018-03-23 DIAGNOSIS — M6281 Muscle weakness (generalized): Secondary | ICD-10-CM | POA: Diagnosis not present

## 2018-03-23 DIAGNOSIS — G8929 Other chronic pain: Secondary | ICD-10-CM | POA: Diagnosis not present

## 2018-03-24 ENCOUNTER — Encounter: Payer: Self-pay | Admitting: Physical Therapy

## 2018-03-24 NOTE — Therapy (Signed)
[place], [place], [address] Phone: [phone]   Fax:  [phone]  Physical Therapy Treatment/ Progress note   Patient Details  Name: Dawn [name] MRN: [id number] Date of Birth: 02-07-1946 Referring Provider: Dr Joni Fears    Encounter Date: 03/23/2018  PT End of Session - 03/23/18 1241    Visit Number  9    Number of Visits  16    Date for PT Re-Evaluation  05/04/18    Authorization Type  Medicare     PT Start Time  4010    PT Stop Time  1229    PT Time Calculation (min)  44 min    Activity Tolerance  Patient tolerated treatment well    Behavior During Therapy  [facility] for tasks assessed/performed       Past Medical History:  Diagnosis Date  . Allergy   . Hot flashes, menopausal 10/13/2011   Estradiol started   . Multiple sclerosis ([place])   . Neuropathy   . Osteoarthritis   . Vision abnormalities     Past Surgical History:  Procedure Laterality Date  . ABDOMINAL HYSTERECTOMY  2002   partial  . LUMBAR FUSION      There were no vitals filed for this visit.  Subjective Assessment - 03/23/18 1151    Subjective  Patient did  a large amount of cleaning last week and her back tightend up. It has been a little tight since that point.     Limitations  Standing;Walking;House hold activities    How long can you stand comfortably?  varys on the time of day and activity     How long can you walk comfortably?  Can walk through the grocery store with a cart     Diagnostic tests  MRI: Multi level degeneration and     Currently in Pain?  Yes    Pain Score  8     Pain Location  Back    Pain Orientation  Right;Left    Pain Descriptors / Indicators  Aching    Pain Type  Chronic pain    Pain Onset  More than a month ago    Aggravating Factors   house work    Pain Relieving Factors  stretching     Effect of Pain on Daily Activities  difficulty perfroming daily tasks          [facility] PT  Assessment - 03/24/18 0001      AROM   Lumbar Flexion  58    Lumbar Extension  18      PROM   Overall PROM Comments  110 degrees PROM       Strength   Left Hip Flexion  4+/5    Left Hip ABduction  4+/5    Left Hip ADduction  4+/5      Right Hip   Right Hip Flexion  108                   [facility] Adult PT Treatment/Exercise - 03/24/18 0001      Lumbar Exercises: Stretches   Passive Hamstring Stretch  Limitations;2 reps;20 seconds    Passive Hamstring Stretch Limitations  seated     Lower Trunk Rotation Limitations  x10 in pain free ranges     Other Lumbar Stretch Exercise  glute stretch 2x20 sec each leg. Careful to nbot hurt the hip on the right      Lumbar Exercises: Supine   AB  Set Limitations  reviewed breathing with exercises     Clam Limitations  2x10 green     Bent Knee Raise Limitations  2x10     Other Supine Lumbar Exercises  ball squeeze 2x10       Manual Therapy   Manual Therapy  Soft tissue mobilization    Manual therapy comments  Gentle LAD bilateral     Soft tissue mobilization  to upper glutes and bilateral lumbar paraspinals using IASTYM;        Trigger Point Dry Needling - 03/24/18 1329    Consent Given?  Yes    Longissimus Response  Twitch response elicited    Gluteus Minimus Response  Twitch response elicited           PT Education - 03/23/18 1152    Education Details  reviewed stretches and ther-ex     Person(s) Educated  Patient    Methods  Explanation;Demonstration;Tactile cues;Verbal cues    Comprehension  Verbalized understanding;Returned demonstration;Verbal cues required;Tactile cues required;Need further instruction       PT Short Term Goals - 03/01/18 1610      PT SHORT TERM GOAL #1   Title  Patient will increase right passive hip flexion to 105 degrees without pain     Baseline  no pain 105    Time  4    Period  Weeks    Status  Achieved      PT SHORT TERM GOAL #2   Title  Patient will increase gross bilateral  lower extremity strength to 4+/5     Baseline  4/5 hip flexion 4+/5 hip abduction     Time  4    Period  Weeks    Status  On-going      PT SHORT TERM GOAL #3   Title  Patient will be independent with basic HEP     Baseline  independnet with exercises given to her at this time     Time  4    Period  Weeks    Status  Achieved        PT Long Term Goals - 12/02/17 1450      PT LONG TERM GOAL #1   Title  Patient will stand for 1 hour without self reported increase pain in order to stand at a kitchen sink.    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 Her FOTO score has gone up significantly. Her strength has improved in bilateral lower extyremitys. She feels like she is walking better and abole to do more activity around the house. Her back does remain reactive though. She did housework last  week and had a significant increase in tightness. She would benefit from further skilled therapy to decrease spasming in her back and improve her ability to perfrom ADL's.     Clinical Presentation  Evolving    Clinical Decision Making  High    Rehab Potential  Good    PT Frequency  2x / week    PT Duration  8 weeks    PT Treatment/Interventions  ADLs/Self Care Home Management;Cryotherapy;Electrical Stimulation;Ultrasound;Moist Heat;Iontophoresis 4mg /ml Dexamethasone;Stair training;Functional mobility training;Therapeutic activities;Therapeutic exercise;Manual techniques;Passive range of motion;Dry needling;Taping    PT  Next Visit Plan  continue to review exercise. Consider standing exercises.     PT Home Exercise Plan  seated core breathing with scap retraction; seated posterior pelvic tilt; tennis ball trigger point release    Consulted and Agree with Plan of Care  Patient       Patient will benefit from skilled therapeutic intervention in order to improve the following deficits and impairments:  Difficulty walking, Decreased activity tolerance, Decreased endurance, Decreased strength, Decreased range of motion, Decreased mobility, Pain, Increased fascial restricitons  Visit Diagnosis: Chronic bilateral low back pain without sciatica - Plan: PT plan of care cert/re-cert  Muscle weakness (generalized) - Plan: PT plan of care cert/re-cert  Cramp and spasm - Plan: PT plan of care cert/re-cert  Difficulty in walking, not elsewhere classified - Plan: PT plan of care cert/re-cert     Problem List Patient Active Problem List   Diagnosis Date Noted  . Abnormal SPEP 02/19/2018  . Hand pain 02/20/2017  . Multiple joint pain 02/18/2017  . Right elbow pain 12/09/2016  . Disturbed cognition 06/25/2016  . Trochanteric bursitis of left hip 02/18/2016  . Sciatica, right side 10/30/2015  . Bilateral arm pain 10/10/2015  . Trochanteric bursitis of right hip 08/04/2014  . Adjustment disorder with mixed anxiety and depressed mood 08/04/2014  . Chronic fatigue 08/04/2014  . Urinary frequency 08/04/2014  . Abnormality of gait 08/04/2014  . Unspecified visual disturbance 01/31/2013  . Routine general medical examination at a health care facility 11/04/2011  . Colon polyps 11/03/2011  . Hot flashes, menopausal 10/13/2011  . POSTHERPETIC NEURALGIA 10/03/2009  . DISPLCMT LUMBAR INTERVERT [place] W/O MYELOPATHY 09/05/2009  . RENAL CALCULUS, RECURRENT 09/04/2009  . HYPERLIPIDEMIA 08/31/2009  . MULTIPLE SCLEROSIS, PROGRESSIVE/RELAPSING 08/31/2009  . ALLERGIC RHINITIS 08/31/2009    Carney Living PT DPT   03/24/2018, 1:32 PM  [facility] [address] [place], [place], [address] Phone: [phone]   Fax:  [phone]  Name: KYANA AICHER MRN: [id number] Date of Birth: 02-17-1946

## 2018-03-30 ENCOUNTER — Encounter: Payer: Self-pay | Admitting: Physical Therapy

## 2018-03-30 ENCOUNTER — Ambulatory Visit: Payer: Medicare Other | Admitting: Physical Therapy

## 2018-03-30 DIAGNOSIS — R262 Difficulty in walking, not elsewhere classified: Secondary | ICD-10-CM | POA: Diagnosis not present

## 2018-03-30 DIAGNOSIS — M545 Low back pain: Principal | ICD-10-CM

## 2018-03-30 DIAGNOSIS — R252 Cramp and spasm: Secondary | ICD-10-CM

## 2018-03-30 DIAGNOSIS — G8929 Other chronic pain: Secondary | ICD-10-CM

## 2018-03-30 DIAGNOSIS — M6281 Muscle weakness (generalized): Secondary | ICD-10-CM

## 2018-03-31 NOTE — Therapy (Signed)
[place], [place], [address] Phone: [phone]   Fax:  [phone]  Physical Therapy Treatment  Patient Details  Name: Dawn [name] MRN: [id number] Date of Birth: [birth year] Referring Provider: Dr Joni Fears    Encounter Date: 03/30/2018  PT End of Session - 03/31/18 1611    Visit Number  10    Number of Visits  16    Date for PT Re-Evaluation  05/04/18    Authorization Type  Medicare     PT Start Time  2353    PT Stop Time  1226    PT Time Calculation (min)  41 min    Activity Tolerance  Patient tolerated treatment well    Behavior During Therapy  [facility] for tasks assessed/performed       Past Medical History:  Diagnosis Date  . Allergy   . Hot flashes, menopausal 10/13/2011   Estradiol started   . Multiple sclerosis ([place])   . Neuropathy   . Osteoarthritis   . Vision abnormalities     Past Surgical History:  Procedure Laterality Date  . ABDOMINAL HYSTERECTOMY  2002   partial  . LUMBAR FUSION      There were no vitals filed for this visit.  Subjective Assessment - 03/30/18 1151    Subjective  Patient reports her back has not been hurting her too much. She has     Limitations  Standing;Walking;House hold activities    How long can you stand comfortably?  varys on the time of day and activity     How long can you walk comfortably?  Can walk through the grocery store with a cart     Diagnostic tests  MRI: Multi level degeneration and                        [facility] Adult PT Treatment/Exercise - 03/31/18 0001      Lumbar Exercises: Stretches   Passive Hamstring Stretch  Limitations;2 reps;20 seconds    Passive Hamstring Stretch Limitations  seated     Lower Trunk Rotation Limitations  x10 in pain free ranges     Other Lumbar Stretch Exercise  glute stretch 2x20 sec each leg. Careful to nbot hurt the hip on the right      Lumbar Exercises: Supine   AB Set  Limitations  reviewed breathing with exercises     Clam Limitations  2x10 green     Bent Knee Raise Limitations  2x10     Other Supine Lumbar Exercises  ball squeeze 2x10       Manual Therapy   Manual Therapy  Soft tissue mobilization    Manual therapy comments  Gentle LAD bilateral     Soft tissue mobilization  to upper glutes and bilateral lumbar paraspinals using IASTYM;        Trigger Point Dry Needling - 03/31/18 1613    Longissimus Response  Twitch response elicited    Gluteus Minimus Response  Twitch response elicited           PT Education - 03/31/18 1610    Education Details  benefits and risks of TPDN     Person(s) Educated  Patient    Methods  Demonstration;Explanation;Tactile cues;Verbal cues    Comprehension  Verbalized understanding;Returned demonstration;Tactile cues required;Verbal cues required;Need further instruction       PT Short Term Goals - 03/01/18 1610      PT SHORT TERM GOAL #1  Title  Patient will increase right passive hip flexion to 105 degrees without pain     Baseline  no pain 105    Time  4    Period  Weeks    Status  Achieved      PT SHORT TERM GOAL #2   Title  Patient will increase gross bilateral lower extremity strength to 4+/5     Baseline  4/5 hip flexion 4+/5 hip abduction     Time  4    Period  Weeks    Status  On-going      PT SHORT TERM GOAL #3   Title  Patient will be independent with basic HEP     Baseline  independnet with exercises given to her at this time     Time  4    Period  Weeks    Status  Achieved        PT Long Term Goals - 12/02/17 1450      PT LONG TERM GOAL #1   Title  Patient will stand for 1 hour without self reported increase pain in order to stand at a kitchen sink.    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 she tolerated treatment well. Therapy will progress patient to standing hip exercises and upper extremity strengthening exercises next visit.     Clinical Presentation  Evolving    Clinical Decision Making  High    Rehab Potential  Good    PT Frequency  2x / week    PT Duration  8 weeks    PT Treatment/Interventions  ADLs/Self Care Home Management;Cryotherapy;Electrical Stimulation;Ultrasound;Moist Heat;Iontophoresis 4mg /ml Dexamethasone;Stair training;Functional mobility training;Therapeutic activities;Therapeutic exercise;Manual techniques;Passive range of motion;Dry needling;Taping    PT Next Visit Plan  continue to review exercise. Consider standing exercises.     PT Home Exercise Plan  seated core breathing with scap retraction; seated posterior pelvic tilt; tennis ball trigger point release    Consulted and Agree with Plan of Care  Patient       Patient will benefit from skilled therapeutic intervention in order to improve the following deficits and impairments:  Pain  Visit Diagnosis: Chronic bilateral low back pain without sciatica  Muscle weakness (generalized)  Cramp and spasm  Difficulty in walking, not elsewhere classified     Problem List Patient Active Problem List   Diagnosis Date Noted  . Abnormal SPEP 02/19/2018  . Hand pain 02/20/2017  . Multiple joint pain 02/18/2017  . Right elbow pain 12/09/2016  . Disturbed cognition 06/25/2016  . Trochanteric bursitis of left hip 02/18/2016  . Sciatica, right side 10/30/2015  .  Bilateral arm pain 10/10/2015  . Trochanteric bursitis of right hip 08/04/2014  . Adjustment disorder with mixed  anxiety and depressed mood 08/04/2014  . Chronic fatigue 08/04/2014  . Urinary frequency 08/04/2014  . Abnormality of gait 08/04/2014  . Unspecified visual disturbance 01/31/2013  . Routine general medical examination at a health care facility 11/04/2011  . Colon polyps 11/03/2011  . Hot flashes, menopausal 10/13/2011  . POSTHERPETIC NEURALGIA 10/03/2009  . DISPLCMT LUMBAR INTERVERT [place] W/O MYELOPATHY 09/05/2009  . RENAL CALCULUS, RECURRENT 09/04/2009  . HYPERLIPIDEMIA 08/31/2009  . MULTIPLE SCLEROSIS, PROGRESSIVE/RELAPSING 08/31/2009  . ALLERGIC RHINITIS 08/31/2009    Carney Living PT DPT  03/31/2018, 4:48 PM  [facility] [address] [place], [place], [address] Phone: [phone]   Fax:  [phone]  Name: Dawn [name] MRN: [id number] Date of Birth: Jul 14, 1946

## 2018-04-06 ENCOUNTER — Ambulatory Visit: Payer: Medicare Other | Admitting: Physical Therapy

## 2018-04-06 ENCOUNTER — Encounter: Payer: Self-pay | Admitting: Physical Therapy

## 2018-04-06 DIAGNOSIS — M6281 Muscle weakness (generalized): Secondary | ICD-10-CM

## 2018-04-06 DIAGNOSIS — R252 Cramp and spasm: Secondary | ICD-10-CM

## 2018-04-06 DIAGNOSIS — R262 Difficulty in walking, not elsewhere classified: Secondary | ICD-10-CM

## 2018-04-06 DIAGNOSIS — M545 Low back pain: Principal | ICD-10-CM

## 2018-04-06 DIAGNOSIS — G8929 Other chronic pain: Secondary | ICD-10-CM | POA: Diagnosis not present

## 2018-04-06 NOTE — Therapy (Signed)
[place], [place], [address] Phone: [phone]   Fax:  [phone]  Physical Therapy Treatment  Patient Details  Name: Dawn [name] MRN: [id number] Date of Birth: May 26, 1946 Referring Provider: Dr Joni Fears    Encounter Date: 04/06/2018  PT End of Session - 04/06/18 1557    Visit Number  11    Number of Visits  16    Date for PT Re-Evaluation  05/04/18    Authorization Type  Medicare     PT Start Time  6269    PT Stop Time  1242    PT Time Calculation (min)  57 min    Activity Tolerance  Patient tolerated treatment well    Behavior During Therapy  [facility] for tasks assessed/performed       Past Medical History:  Diagnosis Date  . Allergy   . Hot flashes, menopausal 10/13/2011   Estradiol started   . Multiple sclerosis ([place])   . Neuropathy   . Osteoarthritis   . Vision abnormalities     Past Surgical History:  Procedure Laterality Date  . ABDOMINAL HYSTERECTOMY  2002   partial  . LUMBAR FUSION      There were no vitals filed for this visit.  Subjective Assessment - 04/06/18 1353    Subjective  Patient reports her back has been doing pretty well. She was able to do walking over the weekend without a significnat increase in back pain.     Limitations  Standing;Walking;House hold activities    How long can you stand comfortably?  varys on the time of day and activity     How long can you walk comfortably?  Can walk through the grocery store with a cart     Diagnostic tests  MRI: Multi level degeneration and     Currently in Pain?  Yes    Pain Score  3     Pain Location  Back    Pain Orientation  Right    Pain Descriptors / Indicators  Aching    Pain Type  Chronic pain    Pain Onset  More than a month ago    Pain Frequency  Constant    Aggravating Factors   house work     Pain Relieving Factors  stretching     Effect of Pain on Daily Activities  difffiuclty perfroming daily tasks                         [facility] Adult PT Treatment/Exercise - 04/06/18 0001      Lumbar Exercises: Standing   Other Standing Lumbar Exercises  standing hip abdcution and extension x10 each side; standing march 2x10 each side; mini squats with mod cuing for tehcnique 2x10; heel raise 2x10; standing march 2x10       Lumbar Exercises: Supine   AB Set Limitations  x10      Manual Therapy   Manual Therapy  Soft tissue mobilization    Manual therapy comments  Gentle LAD bilateral     Soft tissue mobilization  to upper glutes and bilateral lumbar paraspinals using IASTYM;        Trigger Point Dry Needling - 04/06/18 1601    Longissimus Response  Twitch response elicited    Gluteus Minimus Response  Twitch response elicited   in 2 spots on the left and 2 on the right           PT  Education - 04/06/18 1408    Education Details  review HEP, symptom management     Person(s) Educated  Patient    Methods  Explanation    Comprehension  Verbalized understanding;Returned demonstration;Verbal cues required;Tactile cues required       PT Short Term Goals - 03/01/18 1610      PT SHORT TERM GOAL #1   Title  Patient will increase right passive hip flexion to 105 degrees without pain     Baseline  no pain 105    Time  4    Period  Weeks    Status  Achieved      PT SHORT TERM GOAL #2   Title  Patient will increase gross bilateral lower extremity strength to 4+/5     Baseline  4/5 hip flexion 4+/5 hip abduction     Time  4    Period  Weeks    Status  On-going      PT SHORT TERM GOAL #3   Title  Patient will be independent with basic HEP     Baseline  independnet with exercises given to her at this time     Time  4    Period  Weeks    Status  Achieved        PT Long Term Goals - 04/06/18 1559      PT LONG TERM GOAL #1   Title  Patient will stand for 1 hour without self reported increase pain in order to stand at a kitchen sink.    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 She had no significant increase in pain with standing exercises. She had less trigger points today.     Clinical Presentation  Evolving    Clinical Decision Making  Moderate    Rehab Potential  Good    PT Frequency  2x / week    PT Duration  8 weeks    PT Treatment/Interventions  ADLs/Self Care Home Management;Cryotherapy;Electrical Stimulation;Ultrasound;Moist Heat;Iontophoresis 4mg /ml Dexamethasone;Stair training;Functional mobility training;Therapeutic activities;Therapeutic exercise;Manual techniques;Passive range of motion;Dry needling;Taping    PT Next Visit Plan  continue to review exercise. Consider standing exercises.     PT Home Exercise Plan  seated core breathing with scap retraction; seated posterior pelvic tilt; tennis ball trigger point release    Consulted and Agree with Plan of Care  Patient       Patient will benefit from skilled therapeutic intervention in order to improve the following deficits and impairments:  Pain, Decreased endurance, Decreased range of motion, Decreased strength, Decreased activity tolerance, Increased muscle spasms  Visit Diagnosis:  Chronic bilateral low back pain without sciatica  Muscle weakness (generalized)  Cramp and spasm  Difficulty in walking, not elsewhere classified     Problem List Patient Active Problem List   Diagnosis Date Noted  . Abnormal SPEP 02/19/2018  . Hand pain  02/20/2017  . Multiple joint pain 02/18/2017  . Right elbow pain 12/09/2016  . Disturbed cognition 06/25/2016  . Trochanteric bursitis of left hip 02/18/2016  . Sciatica, right side 10/30/2015  . Bilateral arm pain 10/10/2015  . Trochanteric bursitis of right hip 08/04/2014  . Adjustment disorder with mixed anxiety and depressed mood 08/04/2014  . Chronic fatigue 08/04/2014  . Urinary frequency 08/04/2014  . Abnormality of gait 08/04/2014  . Unspecified visual disturbance 01/31/2013  . Routine general medical examination at a health care facility 11/04/2011  . Colon polyps 11/03/2011  . Hot flashes, menopausal 10/13/2011  . POSTHERPETIC NEURALGIA 10/03/2009  . DISPLCMT LUMBAR INTERVERT [place] W/O MYELOPATHY 09/05/2009  . RENAL CALCULUS, RECURRENT 09/04/2009  . HYPERLIPIDEMIA 08/31/2009  . MULTIPLE SCLEROSIS, PROGRESSIVE/RELAPSING 08/31/2009  . ALLERGIC RHINITIS 08/31/2009    Carney Living PT DPT  04/06/2018, 4:07 PM  [facility] [address] [place], [place], [address] Phone: [phone]   Fax:  [phone]  Name: LADEAN STEINMEYER MRN: [id number] Date of Birth: Apr 28, 1946

## 2018-04-13 ENCOUNTER — Ambulatory Visit: Payer: Medicare Other | Admitting: Physical Therapy

## 2018-04-13 ENCOUNTER — Encounter: Payer: Self-pay | Admitting: Physical Therapy

## 2018-04-13 DIAGNOSIS — R252 Cramp and spasm: Secondary | ICD-10-CM

## 2018-04-13 DIAGNOSIS — G8929 Other chronic pain: Secondary | ICD-10-CM

## 2018-04-13 DIAGNOSIS — R262 Difficulty in walking, not elsewhere classified: Secondary | ICD-10-CM | POA: Diagnosis not present

## 2018-04-13 DIAGNOSIS — M6281 Muscle weakness (generalized): Secondary | ICD-10-CM | POA: Diagnosis not present

## 2018-04-13 DIAGNOSIS — M545 Low back pain: Principal | ICD-10-CM

## 2018-04-13 NOTE — Therapy (Signed)
[place], [place], [address] Phone: [phone]   Fax:  [phone]  Physical Therapy Treatment  Patient Details  Name: Dawn [name] MRN: [id number] Date of Birth: April 16, 1946 Referring Provider: Dr Joni Fears    Encounter Date: 04/13/2018  PT End of Session - 04/13/18 1108    Visit Number  12    Number of Visits  16    Date for PT Re-Evaluation  05/04/18    Authorization Type  Medicare     PT Start Time  1100    PT Stop Time  1151    PT Time Calculation (min)  51 min    Activity Tolerance  Patient tolerated treatment well    Behavior During Therapy  [facility] for tasks assessed/performed       Past Medical History:  Diagnosis Date  . Allergy   . Hot flashes, menopausal 10/13/2011   Estradiol started   . Multiple sclerosis ([place])   . Neuropathy   . Osteoarthritis   . Vision abnormalities     Past Surgical History:  Procedure Laterality Date  . ABDOMINAL HYSTERECTOMY  2002   partial  . LUMBAR FUSION      There were no vitals filed for this visit.  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     Limitations  Standing;Walking;House hold activities    How long can you stand comfortably?  varys on the time of day and activity     How long can you walk comfortably?  Can walk through the grocery store with a cart     Diagnostic tests  MRI: Multi level degeneration and     Currently in Pain?  Yes    Pain Score  3     Pain Location  Back    Pain Orientation  Right    Pain Descriptors / Indicators  Aching    Pain Type  Chronic pain    Pain Onset  More than a month ago    Pain Frequency  Constant    Aggravating Factors   housework     Pain Relieving Factors  stretching     Effect of Pain on Daily Activities  difficulty perfroming daily tasks                        [facility]  Adult PT Treatment/Exercise - 04/13/18 0001      Lumbar Exercises: Stretches   Passive Hamstring Stretch  Limitations;2 reps;20 seconds    Passive Hamstring Stretch Limitations  seated     Lower Trunk Rotation Limitations  x10 in pain free ranges       Lumbar Exercises: Supine   Other Supine Lumbar Exercises  seated bilateral shoulder ER 2x10 yellow; bilateral shoulder abduction 2x10 yellow; seated shouler flexion with band x10 each arm       Modalities   Modalities  Moist Heat      Moist Heat Therapy   Number Minutes Moist Heat  10 Minutes    Moist Heat Location  Lumbar Spine      Manual Therapy   Manual therapy comments  Gentle LAD bilateral     Soft tissue mobilization  sidelyingIT band and glurt roll out; large trigger point on the left       Trigger Point Dry Needling - 04/13/18 1621  Consent Given?  Yes    Gluteus Minimus Response  Twitch response elicited           PT Education - 04/13/18 1107    Education Details  review HEP,     Person(s) Educated  Patient    Methods  Explanation;Demonstration;Tactile cues;Verbal cues    Comprehension  Verbalized understanding;Returned demonstration;Verbal cues required;Tactile cues required       PT Short Term Goals - 03/01/18 1610      PT SHORT TERM GOAL #1   Title  Patient will increase right passive hip flexion to 105 degrees without pain     Baseline  no pain 105    Time  4    Period  Weeks    Status  Achieved      PT SHORT TERM GOAL #2   Title  Patient will increase gross bilateral lower extremity strength to 4+/5     Baseline  4/5 hip flexion 4+/5 hip abduction     Time  4    Period  Weeks    Status  On-going      PT SHORT TERM GOAL #3   Title  Patient will be independent with basic HEP     Baseline  independnet with exercises given to her at this time     Time  4    Period  Weeks    Status  Achieved        PT Long Term Goals - 04/06/18 1559      PT LONG TERM GOAL #1   Title  Patient will stand  for 1 hour without self reported increase pain in order to stand at a kitchen sink.    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 she is doing more around the house and walking more. she had some pain in her hips today but had less pain after treatment. She was given further sitting exercises. Therapy only needled 1 spot today because she only had 1 small trigger point,     Clinical Presentation  Evolving    Rehab Potential  Good    PT Frequency  2x / week    PT Duration  8 weeks    PT Treatment/Interventions  ADLs/Self Care Home Management;Cryotherapy;Electrical Stimulation;Ultrasound;Moist Heat;Iontophoresis 4mg /ml Dexamethasone;Stair training;Functional mobility training;Therapeutic activities;Therapeutic exercise;Manual techniques;Passive range of motion;Dry needling;Taping    PT Next Visit Plan  continue to review exercise. Consider standing exercises.     PT Home Exercise Plan  seated core breathing with scap retraction; seated posterior pelvic tilt; tennis ball trigger point release    Consulted and Agree with Plan of  Care  Patient       Patient will benefit from skilled therapeutic intervention in order to improve the following deficits and impairments:  Pain, Decreased endurance, Decreased range of motion, Decreased strength, Decreased activity tolerance, Increased muscle spasms  Visit Diagnosis: Chronic bilateral low back pain without  sciatica  Muscle weakness (generalized)  Cramp and spasm  Difficulty in walking, not elsewhere classified     Problem List Patient Active Problem List   Diagnosis Date Noted  . Abnormal SPEP 02/19/2018  . Hand pain 02/20/2017  . Multiple joint pain 02/18/2017  . Right elbow pain 12/09/2016  . Disturbed cognition 06/25/2016  . Trochanteric bursitis of left hip 02/18/2016  . Sciatica, right side 10/30/2015  . Bilateral arm pain 10/10/2015  . Trochanteric bursitis of right hip 08/04/2014  . Adjustment disorder with mixed anxiety and depressed mood 08/04/2014  . Chronic fatigue 08/04/2014  . Urinary frequency 08/04/2014  . Abnormality of gait 08/04/2014  . Unspecified visual disturbance 01/31/2013  . Routine general medical examination at a health care facility 11/04/2011  . Colon polyps 11/03/2011  . Hot flashes, menopausal 10/13/2011  . POSTHERPETIC NEURALGIA 10/03/2009  . DISPLCMT LUMBAR INTERVERT [place] W/O MYELOPATHY 09/05/2009  . RENAL CALCULUS, RECURRENT 09/04/2009  . HYPERLIPIDEMIA 08/31/2009  . MULTIPLE SCLEROSIS, PROGRESSIVE/RELAPSING 08/31/2009  . ALLERGIC RHINITIS 08/31/2009    Carney Living 04/13/2018, 4:26 PM  [place], [place], [address] Phone: [phone]   Fax:  [phone]  Name: Dawn [name] MRN: [id number] Date of Birth: 11/04/1945

## 2018-04-21 ENCOUNTER — Encounter: Payer: Self-pay | Admitting: Physical Therapy

## 2018-04-21 ENCOUNTER — Ambulatory Visit: Payer: Medicare Other | Attending: Orthopaedic Surgery | Admitting: Physical Therapy

## 2018-04-21 DIAGNOSIS — R252 Cramp and spasm: Secondary | ICD-10-CM | POA: Diagnosis not present

## 2018-04-21 DIAGNOSIS — M6281 Muscle weakness (generalized): Secondary | ICD-10-CM | POA: Diagnosis not present

## 2018-04-21 DIAGNOSIS — R262 Difficulty in walking, not elsewhere classified: Secondary | ICD-10-CM | POA: Diagnosis not present

## 2018-04-21 DIAGNOSIS — M545 Low back pain, unspecified: Secondary | ICD-10-CM

## 2018-04-21 DIAGNOSIS — G8929 Other chronic pain: Secondary | ICD-10-CM | POA: Insufficient documentation

## 2018-04-22 ENCOUNTER — Encounter: Payer: Self-pay | Admitting: Physical Therapy

## 2018-04-22 NOTE — Therapy (Signed)
[place], [place], [address] Phone: [phone]   Fax:  [phone]  Physical Therapy Treatment  Patient Details  Name: Dawn [name] MRN: [id number] Date of Birth: 1945/08/27 Referring Provider (PT): Dr Joni Fears    Encounter Date: 04/21/2018  PT End of Session - 04/21/18 1213    Visit Number  13    Number of Visits  16    Date for PT Re-Evaluation  05/04/18    Authorization Type  Medicare     PT Start Time  1157    PT Stop Time  1250    PT Time Calculation (min)  53 min    Equipment Utilized During Treatment  Gait belt    Activity Tolerance  Patient tolerated treatment well    Behavior During Therapy  [facility] for tasks assessed/performed       Past Medical History:  Diagnosis Date  . Allergy   . Hot flashes, menopausal 10/13/2011   Estradiol started   . Multiple sclerosis ([place])   . Neuropathy   . Osteoarthritis   . Vision abnormalities     Past Surgical History:  Procedure Laterality Date  . ABDOMINAL HYSTERECTOMY  2002   partial  . LUMBAR FUSION      There were no vitals filed for this visit.  Subjective Assessment - 04/21/18 1155    Subjective  Patient reports mild back pain at time of session. Says she over did it this weekend with house hold activities and as a result "flared" up her MS. Pt having pain in bil hands at time of session but believes it is due to the MS flare up.     Limitations  Standing;Walking;House hold activities    How long can you stand comfortably?  varys on the time of day and activity     How long can you walk comfortably?  Can walk through the grocery store with a cart     Diagnostic tests  MRI: Multi level degeneration and     Currently in Pain?  Yes    Pain Score  3     Pain Location  Back    Pain Orientation  Lower;Right    Pain Descriptors / Indicators  Discomfort;Sore    Pain Type  Chronic pain    Pain Onset  More than a month ago    Pain  Frequency  Constant    Aggravating Factors   housework     Pain Relieving Factors  stretching     Effect of Pain on Daily Activities  difficulty perfroming                        [facility] Adult PT Treatment/Exercise - 04/22/18 0001      High Level Balance   High Level Balance Comments  narrow base of support eo and ec 3x30 sec each min a with eyes closed; tandem stance eyes open 2x30 sec hold each direction       Lumbar Exercises: Stretches   Passive Hamstring Stretch  Limitations;2 reps;20 seconds    Passive Hamstring Stretch Limitations  seated     Lower Trunk Rotation Limitations  1x10     Other Lumbar Stretch Exercise  glute stretch 2x10 sec each leg; knee to chest each leg 1x20sec      Lumbar Exercises: Supine   AB Set Limitations  x10 with hip flexion each leg; x20 with ball squeezes;  x20 supine clams  Other Supine Lumbar Exercises  legs on ball rotation x10; reverse cruch 2x10      Modalities   Modalities  Moist Heat      Moist Heat Therapy   Number Minutes Moist Heat  10 Minutes    Moist Heat Location  Lumbar Spine      Manual Therapy   Manual therapy comments  Gentle LAD bilateral              PT Education - 04/21/18 1436    Education Details  Reviewed HEP; educated on scaling back strengthening HEP while MS is exacerbated     Person(s) Educated  Patient    Methods  Explanation;Verbal cues    Comprehension  Verbalized understanding;Returned demonstration;Verbal cues required;Tactile cues required       PT Short Term Goals - 03/01/18 1610      PT SHORT TERM GOAL #1   Title  Patient will increase right passive hip flexion to 105 degrees without pain     Baseline  no pain 105    Time  4    Period  Weeks    Status  Achieved      PT SHORT TERM GOAL #2   Title  Patient will increase gross bilateral lower extremity strength to 4+/5     Baseline  4/5 hip flexion 4+/5 hip abduction     Time  4    Period  Weeks    Status  On-going      PT  SHORT TERM GOAL #3   Title  Patient will be independent with basic HEP     Baseline  independnet with exercises given to her at this time     Time  4    Period  Weeks    Status  Achieved        PT Long Term Goals - 04/06/18 1559      PT LONG TERM GOAL #1   Title  Patient will stand for 1 hour without self reported increase pain in order to stand at a kitchen sink.    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 Continued with stretching program and stabilization exercises with good tolerance. Progressed balance exercises in standing with EO & EC with narrow BOS and tandem. Pt with increased trunk sway in tandem with EC requiring tactile/verbal cues to prevent LOB posteriorly.     Clinical Presentation  Evolving    Clinical Decision Making  Moderate    Rehab Potential  Good    PT Frequency  2x / week    PT Duration  8 weeks    PT Treatment/Interventions  ADLs/Self Care Home Management;Cryotherapy;Electrical Stimulation;Ultrasound;Moist Heat;Iontophoresis 4mg /ml Dexamethasone;Stair training;Functional mobility training;Therapeutic activities;Therapeutic exercise;Manual techniques;Passive range of motion;Dry needling;Taping    PT Next Visit Plan  continue to review exercise. Progress standing balance exercises - air ex, reactive/ anticipitory activities     PT Home Exercise Plan  supine core breathing with hip marching; standing  balance exercises at sink     Consulted and Agree with Plan of Care  Patient       Patient will benefit from skilled therapeutic intervention in order to improve the following deficits and impairments:  Pain, Decreased endurance, Decreased range of motion, Decreased strength, Decreased activity tolerance, Increased muscle spasms  Visit Diagnosis: Chronic bilateral low back pain without sciatica  Muscle weakness (generalized)  Cramp and spasm  Difficulty in walking, not elsewhere classified     Problem List Patient Active Problem List   Diagnosis Date Noted  . Abnormal SPEP 02/19/2018  . Hand pain 02/20/2017  . Multiple joint pain 02/18/2017  . Right elbow pain 12/09/2016  . Disturbed cognition 06/25/2016  . Trochanteric bursitis of left hip 02/18/2016  . Sciatica, right side 10/30/2015  . Bilateral arm pain 10/10/2015  . Trochanteric bursitis of right hip 08/04/2014  . Adjustment disorder with mixed anxiety and depressed mood 08/04/2014  . Chronic fatigue 08/04/2014  . Urinary frequency 08/04/2014  . Abnormality of gait 08/04/2014  . Unspecified visual disturbance 01/31/2013  . Routine general medical examination at a health care facility 11/04/2011  . Colon polyps 11/03/2011  . Hot flashes, menopausal 10/13/2011  . POSTHERPETIC NEURALGIA 10/03/2009  . DISPLCMT LUMBAR INTERVERT [place] W/O MYELOPATHY 09/05/2009  . RENAL CALCULUS, RECURRENT 09/04/2009  . HYPERLIPIDEMIA 08/31/2009  . MULTIPLE SCLEROSIS, PROGRESSIVE/RELAPSING 08/31/2009  . ALLERGIC RHINITIS 08/31/2009    Carney Living PT DPT  04/22/2018, 8:36 AM  [facility] [address] [place], [place], [address] Phone: [phone]   Fax:  [phone]  Name: Dawn [name] MRN: [id number] Date of Birth: 1946/06/03

## 2018-04-27 ENCOUNTER — Other Ambulatory Visit: Payer: Self-pay | Admitting: Neurology

## 2018-04-28 ENCOUNTER — Encounter: Payer: Self-pay | Admitting: Physical Therapy

## 2018-04-28 ENCOUNTER — Ambulatory Visit: Payer: Medicare Other | Admitting: Physical Therapy

## 2018-04-28 DIAGNOSIS — M545 Low back pain: Secondary | ICD-10-CM | POA: Diagnosis not present

## 2018-04-28 DIAGNOSIS — G8929 Other chronic pain: Secondary | ICD-10-CM

## 2018-04-28 DIAGNOSIS — R262 Difficulty in walking, not elsewhere classified: Secondary | ICD-10-CM

## 2018-04-28 DIAGNOSIS — M6281 Muscle weakness (generalized): Secondary | ICD-10-CM

## 2018-04-28 DIAGNOSIS — R252 Cramp and spasm: Secondary | ICD-10-CM | POA: Diagnosis not present

## 2018-04-28 NOTE — Therapy (Signed)
[place], [place], [address] Phone: [phone]   Fax:  [phone]  Physical Therapy Treatment  Patient Details  Name: Dawn [name] MRN: [id number] Date of Birth: [birth year] Referring Provider (PT): Dr Joni Fears    Encounter Date: 04/28/2018  PT End of Session - 04/28/18 1343    Visit Number  14    Number of Visits  16    Date for PT Re-Evaluation  05/04/18    Authorization Type  Medicare     PT Start Time  2706    PT Stop Time  1239    PT Time Calculation (min)  54 min    Activity Tolerance  Patient tolerated treatment well    Behavior During Therapy  [facility] for tasks assessed/performed       Past Medical History:  Diagnosis Date  . Allergy   . Hot flashes, menopausal 10/13/2011   Estradiol started   . Multiple sclerosis ([place])   . Neuropathy   . Osteoarthritis   . Vision abnormalities     Past Surgical History:  Procedure Laterality Date  . ABDOMINAL HYSTERECTOMY  2002   partial  . LUMBAR FUSION      There were no vitals filed for this visit.  Subjective Assessment - 04/28/18 1152    Subjective  Patient reports right sided low back pain. She has been out walking everyday but pain stays pretty constant in the right part of her low back. Left low back has not bothered her much lately.     Limitations  Standing;Walking;House hold activities    How long can you stand comfortably?  varys on the time of day and activity     How long can you walk comfortably?  Can walk through the grocery store with a cart     Diagnostic tests  MRI: Multi level degeneration and     Currently in Pain?  Yes    Pain Score  3     Pain Location  Back    Pain Orientation  Lower;Right    Pain Descriptors / Indicators  Discomfort;Dull    Pain Type  Chronic pain    Pain Onset  More than a month ago    Pain Frequency  Constant    Aggravating Factors   housework    Pain Relieving Factors  stretching     Effect of Pain on Daily Activities  difficulty performing                       [facility] Adult PT Treatment/Exercise - 04/28/18 0001      Lumbar Exercises: Stretches   Lower Trunk Rotation Limitations  1x10       Lumbar Exercises: Supine   Other Supine Lumbar Exercises  seated on disc; bilateral er 2x10 yellow; seated shoulder abduction 2x10 yellow; seated shoulder flexion x10 bilateral; seated reach out x10 bilateral; increased speed low range shoulder flexion       Moist Heat Therapy   Number Minutes Moist Heat  10 Minutes    Moist Heat Location  Lumbar Spine      Manual Therapy   Manual therapy comments  Gentle LAD bilateral     Soft tissue mobilization  IASTYM using rock blade to glutes and lumbar spine        Trigger Point Dry Needling - 04/28/18 1408    Consent Given?  Yes    Longissimus Response  Twitch response elicited  right L5    Gluteus Minimus Response  Twitch response elicited             PT Short Term Goals - 03/01/18 1610      PT SHORT TERM GOAL #1   Title  Patient will increase right passive hip flexion to 105 degrees without pain     Baseline  no pain 105    Time  4    Period  Weeks    Status  Achieved      PT SHORT TERM GOAL #2   Title  Patient will increase gross bilateral lower extremity strength to 4+/5     Baseline  4/5 hip flexion 4+/5 hip abduction     Time  4    Period  Weeks    Status  On-going      PT SHORT TERM GOAL #3   Title  Patient will be independent with basic HEP     Baseline  independnet with exercises given to her at this time     Time  4    Period  Weeks    Status  Achieved        PT Long Term Goals - 04/06/18 1559      PT LONG TERM GOAL #1   Title  Patient will stand for 1 hour without self reported increase pain in order to stand at a kitchen sink.    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 She did not report any pain. therapy focused on manual thrapy to the right parapspinals     Clinical Presentation  Evolving    Clinical Decision Making  Moderate    Rehab Potential  Good    PT Frequency  2x / week    PT Duration  8 weeks    PT Treatment/Interventions  ADLs/Self Care Home Management;Cryotherapy;Electrical Stimulation;Ultrasound;Moist Heat;Iontophoresis 4mg /ml Dexamethasone;Stair training;Functional mobility training;Therapeutic activities;Therapeutic exercise;Manual techniques;Passive range of motion;Dry needling;Taping    PT Next Visit Plan  continue to review exercise. Progress standing balance exercises - air ex, reactive/ anticipitory activities     PT Home Exercise Plan  supine core breathing with hip marching; standing balance exercises at sink     Consulted and Agree with Plan of Care  Patient       Patient will benefit from skilled therapeutic intervention in order to improve the following deficits and impairments:  Pain, Decreased endurance, Decreased range of motion, Decreased strength, Decreased activity tolerance, Increased muscle spasms  Visit Diagnosis: Chronic bilateral low back pain without sciatica  Muscle weakness (generalized)  Cramp and spasm  Difficulty in walking, not elsewhere classified     Problem List Patient Active Problem List   Diagnosis Date Noted  . Abnormal SPEP 02/19/2018  . Hand pain 02/20/2017  . Multiple joint pain 02/18/2017  . Right elbow pain 12/09/2016  . Disturbed  cognition 06/25/2016  . Trochanteric bursitis of left hip 02/18/2016  . Sciatica, right side 10/30/2015  . Bilateral arm pain 10/10/2015  . Trochanteric bursitis of right hip 08/04/2014  . Adjustment disorder with mixed anxiety and depressed mood 08/04/2014  . Chronic fatigue 08/04/2014  . Urinary frequency 08/04/2014  . Abnormality of gait 08/04/2014  . Unspecified visual disturbance 01/31/2013  . Routine general medical examination at a health care facility 11/04/2011  . Colon polyps 11/03/2011  . Hot flashes, menopausal 10/13/2011  . POSTHERPETIC NEURALGIA 10/03/2009  . DISPLCMT LUMBAR INTERVERT [place] W/O MYELOPATHY 09/05/2009  . RENAL CALCULUS, RECURRENT 09/04/2009  . HYPERLIPIDEMIA 08/31/2009  . MULTIPLE SCLEROSIS, PROGRESSIVE/RELAPSING 08/31/2009  . ALLERGIC RHINITIS 08/31/2009    Carney Living PT DPT  04/28/2018, 2:11 PM   Einar Crow SPT  04/28/2018   During this treatment session, the therapist was present, participating in and directing the treatment.   [place], [place], [address] Phone: [phone]   Fax:  [phone]  Name: JAMELLE NOY MRN: [id number] Date of Birth: Jun 27, 1946

## 2018-05-05 ENCOUNTER — Encounter: Payer: Medicare Other | Admitting: Physical Therapy

## 2018-05-10 ENCOUNTER — Encounter: Payer: Self-pay | Admitting: Physical Therapy

## 2018-05-10 ENCOUNTER — Ambulatory Visit: Payer: Medicare Other | Admitting: Physical Therapy

## 2018-05-10 DIAGNOSIS — M545 Low back pain: Principal | ICD-10-CM

## 2018-05-10 DIAGNOSIS — R252 Cramp and spasm: Secondary | ICD-10-CM

## 2018-05-10 DIAGNOSIS — M6281 Muscle weakness (generalized): Secondary | ICD-10-CM

## 2018-05-10 DIAGNOSIS — G8929 Other chronic pain: Secondary | ICD-10-CM | POA: Diagnosis not present

## 2018-05-10 DIAGNOSIS — R262 Difficulty in walking, not elsewhere classified: Secondary | ICD-10-CM | POA: Diagnosis not present

## 2018-05-10 NOTE — Therapy (Signed)
[place], [place], [address] Phone: [phone]   Fax:  [phone]  Physical Therapy Treatment  Patient Details  Name: Dawn [name] MRN: [id number] Date of Birth: [birth year] Referring Provider (PT): Dr Joni Fears    Encounter Date: 05/10/2018  PT End of Session - 05/10/18 1421    Visit Number  15    Number of Visits  16    Date for PT Re-Evaluation  05/04/18    Authorization Type  Medicare     PT Start Time  1331    PT Stop Time  1416    PT Time Calculation (min)  45 min    Activity Tolerance  Patient tolerated treatment well;No increased pain    Behavior During Therapy  WFL for tasks assessed/performed       Past Medical History:  Diagnosis Date  . Allergy   . Hot flashes, menopausal 10/13/2011   Estradiol started   . Multiple sclerosis ([place])   . Neuropathy   . Osteoarthritis   . Vision abnormalities     Past Surgical History:  Procedure Laterality Date  . ABDOMINAL HYSTERECTOMY  2002   partial  . LUMBAR FUSION      There were no vitals filed for this visit.  Subjective Assessment - 05/10/18 1439    Subjective  PT has been helping the pain from the discs a lot.  Balance has been about the same.  This time of the day i am already tired.     Currently in Pain?  Yes    Pain Score  3     Pain Location  Back    Pain Orientation  Lower;Right    Pain Frequency  Constant    Aggravating Factors   housework,   worse as day wears on    Pain Relieving Factors   stretches    Effect of Pain on Daily Activities  Sits often  ADLs difficult                       [facility] Adult PT Treatment/Exercise - 05/10/18 0001      Lumbar Exercises: Stretches   Passive Hamstring Stretch  3 reps;30 seconds    Passive Hamstring Stretch Limitations  both    Lower Trunk Rotation Limitations  1x10    limited to right due to guarding, avoiding possible hip pain   Gastroc Stretch  3 reps;30  seconds    Gastroc Stretch Limitations  HEP/ left tighter      Lumbar Exercises: Supine   Other Supine Lumbar Exercises  hooklying ball squeeze with abdominal bracing,    Other Supine Lumbar Exercises  gentle PNF right leg to decrease tone      Manual Therapy   Manual therapy comments  Gentle LAD bilateral    7 bouts 15-20 seconds   Soft tissue mobilization  retrograde  medial hamstrings.  tissue softened.              PT Education - 05/10/18 1415    Education Details  HEP    Person(s) Educated  Patient    Methods  Explanation;Handout;Verbal cues;Tactile cues;Demonstration    Comprehension  Verbalized understanding       PT Short Term Goals - 05/10/18 1425      PT SHORT TERM GOAL #1   Title  Patient will increase right passive hip flexion to 105 degrees without pain     Time  4  Period  Weeks    Status  Achieved      PT SHORT TERM GOAL #2   Title  Patient will increase gross bilateral lower extremity strength to 4+/5     Time  4    Period  Weeks    Status  Unable to assess      PT SHORT TERM GOAL #3   Title  Patient will be independent with basic HEP     Baseline  independnet with exercises given to her at this time     Time  4    Period  Weeks    Status  Achieved        PT Long Term Goals - 05/10/18 1425      PT LONG TERM GOAL #1   Title  Patient will stand for 1 hour without self reported increase pain in order to stand at a kitchen sink.    Baseline  standing is better in the morning.  minutes not assessed.     Time  8    Status  On-going      PT LONG TERM GOAL #2   Title  Patient will have a 53% limitation in FOTO     Time  8    Period  Weeks    Status  Unable to assess      PT LONG TERM GOAL #3   Baseline  guarded  ROM    Time  8    Period  Weeks    Status  On-going            Plan - 05/10/18 1422    Clinical Impression Statement  PAPatient presented fatigued this afternoon so scaled back energy equired for exercise.  Noted left  ankle with decreased ROM  so addressed this with a new stretch.  this could possibly improve foot drop at end of day  ( anterior tib less fatigued)   Patient felt energised at the end of session.    PT Next Visit Plan  continue to review exercise. Progress standing balance exercises - air ex, reactive/ anticipitory activities     PT Home Exercise Plan  supine core breathing with hip marching; standing balance exercises at sink ,  calf stretch with rope.    Consulted and Agree with Plan of Care  Patient       Patient will benefit from skilled therapeutic intervention in order to improve the following deficits and impairments:     Visit Diagnosis: Chronic bilateral low back pain without sciatica  Muscle weakness (generalized)  Cramp and spasm  Difficulty in walking, not elsewhere classified     Problem List Patient Active Problem List   Diagnosis Date Noted  . Abnormal SPEP 02/19/2018  . Hand pain 02/20/2017  . Multiple joint pain 02/18/2017  . Right elbow pain 12/09/2016  . Disturbed cognition 06/25/2016  . Trochanteric bursitis of left hip 02/18/2016  . Sciatica, right side 10/30/2015  . Bilateral arm pain 10/10/2015  . Trochanteric bursitis of right hip 08/04/2014  . Adjustment disorder with mixed anxiety and depressed mood 08/04/2014  . Chronic fatigue 08/04/2014  . Urinary frequency 08/04/2014  . Abnormality of gait 08/04/2014  . Unspecified visual disturbance 01/31/2013  . Routine general medical examination at a health care facility 11/04/2011  . Colon polyps 11/03/2011  . Hot flashes, menopausal 10/13/2011  . POSTHERPETIC NEURALGIA 10/03/2009  . DISPLCMT LUMBAR INTERVERT [place] W/O MYELOPATHY 09/05/2009  . RENAL CALCULUS, RECURRENT 09/04/2009  . HYPERLIPIDEMIA 08/31/2009  .  MULTIPLE SCLEROSIS, PROGRESSIVE/RELAPSING 08/31/2009  . ALLERGIC RHINITIS 08/31/2009    Thressa Shiffer  PTA 05/10/2018, 5:36 PM  [facility] [address] [place], [place], [address] Phone: [phone]   Fax:  [phone]  Name: Dawn [name] MRN: [id number] Date of Birth: 1946/04/27

## 2018-05-10 NOTE — Patient Instructions (Signed)
Calf Stretch    With towel around forefoot, keep knee straight and pull back on towel until a stretch is felt in the calf. Hold _30___ seconds. Repeat __3__ times. Do 1____ sessions per day.  Copyright  V[employer]. All rights reserved.

## 2018-05-11 ENCOUNTER — Telehealth: Payer: Self-pay | Admitting: *Deleted

## 2018-05-11 NOTE — Telephone Encounter (Signed)
Pt handicap placard on [address]

## 2018-05-12 NOTE — Telephone Encounter (Signed)
Gave completed/signed form back to medical records to process for pt. 

## 2018-05-15 DIAGNOSIS — Z23 Encounter for immunization: Secondary | ICD-10-CM | POA: Diagnosis not present

## 2018-05-17 ENCOUNTER — Telehealth: Payer: Self-pay | Admitting: *Deleted

## 2018-05-17 NOTE — Telephone Encounter (Signed)
Initiated PA modafinil 200mg  tab on covermymeds. Key: [id number]. In process of completing.

## 2018-05-17 NOTE — Telephone Encounter (Signed)
PA submitted. Waiting on determination.  Dx: fatigue r/t MS. (R53.83, G35).

## 2018-05-17 NOTE — Telephone Encounter (Signed)
PA approved effective until 11/16/18 under Medicare part D. Faxed notice of approval to [facility] at [phone]. Received fax confirmation.

## 2018-05-18 ENCOUNTER — Encounter: Payer: Medicare Other | Admitting: Physical Therapy

## 2018-05-26 ENCOUNTER — Encounter: Payer: Self-pay | Admitting: Physical Therapy

## 2018-05-26 ENCOUNTER — Ambulatory Visit: Payer: Medicare Other | Attending: Orthopaedic Surgery | Admitting: Physical Therapy

## 2018-05-26 DIAGNOSIS — R252 Cramp and spasm: Secondary | ICD-10-CM | POA: Insufficient documentation

## 2018-05-26 DIAGNOSIS — G8929 Other chronic pain: Secondary | ICD-10-CM | POA: Diagnosis not present

## 2018-05-26 DIAGNOSIS — M545 Low back pain: Secondary | ICD-10-CM | POA: Diagnosis not present

## 2018-05-26 DIAGNOSIS — M6281 Muscle weakness (generalized): Secondary | ICD-10-CM | POA: Insufficient documentation

## 2018-05-26 DIAGNOSIS — R262 Difficulty in walking, not elsewhere classified: Secondary | ICD-10-CM | POA: Diagnosis not present

## 2018-05-26 NOTE — Therapy (Signed)
[name], [place], [address] Phone: [phone]   Fax:  [phone]  Physical Therapy Treatment/ Recert   Patient Details  Name: Dawn [name] MRN: [id number] Date of Birth: 1945/08/18 Referring Provider (PT): Dr Joni Fears    Encounter Date: 05/26/2018   Progress Note Reporting Period 05/26/2018 to 07/21/2017  See note below for Objective Data and Assessment of Progress/Goals.       PT End of Session - 05/26/18 1129    Visit Number  16    Number of Visits  24    Date for PT Re-Evaluation  07/21/18    Authorization Type  Medicare     PT Start Time  7124    PT Stop Time  1106    PT Time Calculation (min)  51 min    Activity Tolerance  Patient tolerated treatment well    Behavior During Therapy  WFL for tasks assessed/performed       Past Medical History:  Diagnosis Date  . Allergy   . Hot flashes, menopausal 10/13/2011   Estradiol started   . Multiple sclerosis ([place])   . Neuropathy   . Osteoarthritis   . Vision abnormalities     Past Surgical History:  Procedure Laterality Date  . ABDOMINAL HYSTERECTOMY  2002   partial  . LUMBAR FUSION      There were no vitals filed for this visit.  Subjective Assessment - 05/26/18 1023    Subjective  Pt reports pain in low back hasn't been too bad since last seen in October. Pain is still constant and seems to increase throughout the day, but better than it has been lately.   (Pended)     Limitations  Standing;Walking;House hold activities  (Pended)     How long can you stand comfortably?  varys on the time of day and activity   (Pended)     How long can you walk comfortably?  Can walk through the grocery store with a cart   (Pended)     Diagnostic tests  MRI: Multi level degeneration and   (Pended)     Currently in Pain?  Yes  (Pended)     Pain Score  3   (Pended)     Pain Location  Back  (Pended)     Pain Orientation  Lower;Right   (Pended)     Pain Descriptors / Indicators  Aching  (Pended)     Pain Type  Chronic pain  (Pended)     Pain Onset  More than a month ago  (Pended)     Pain Frequency  Constant  (Pended)     Aggravating Factors   housework, worse as day wears on   (Pended)     Pain Relieving Factors  stretches   (Pended)          [facility] PT Assessment - 05/26/18 0001      AROM   Lumbar Flexion  60    Lumbar Extension  18      PROM   Overall PROM Comments  110 degrees PROM       Strength   Left Hip Flexion  4+/5    Left Hip ABduction  4+/5    Left Hip ADduction  4+/5      Right Hip   Right Hip Flexion  108                   [facility] Adult PT Treatment/Exercise - 05/26/18  0001      Lumbar Exercises: Stretches   Passive Hamstring Stretch  3 reps;30 seconds    Passive Hamstring Stretch Limitations  both    Lower Trunk Rotation Limitations  --   declined today      Lumbar Exercises: Supine   Other Supine Lumbar Exercises  decompression: Shoulder press x10; leg lengthener; leg press x10 5 sec hold with cuing for breathing       Moist Heat Therapy   Number Minutes Moist Heat  15 Minutes    Moist Heat Location  Cervical;Lumbar Spine      Manual Therapy   Manual therapy comments  Gentle LAD bilateral; skilled palpation of trigger points.    7 bouts 15-20 seconds   Soft tissue mobilization  soft tissue mobilization to upper gluteals and lumbar parapsinals.              PT Education - 05/26/18 1122    Education Details  reviewed decompression series     Person(s) Educated  Patient    Methods  Explanation;Demonstration;Tactile cues;Verbal cues    Comprehension  Verbalized understanding;Returned demonstration;Verbal cues required;Tactile cues required       PT Short Term Goals - 05/26/18 1145      PT SHORT TERM GOAL #1   Title  Patient will increase right passive hip flexion to 105 degrees without pain     Baseline  no pain 105    Time  4    Period  Weeks    Status   Achieved      PT SHORT TERM GOAL #2   Title  Patient will increase gross bilateral lower extremity strength to 4+/5     Baseline  4/5 hip flexion 4+/5 hip abduction     Time  4    Period  Weeks    Status  Achieved      PT SHORT TERM GOAL #3   Title  Patient will be independent with basic HEP     Baseline  independnet with exercises given to her at this time     Time  4    Period  Weeks    Status  Achieved        PT Long Term Goals - 05/26/18 1145      PT LONG TERM GOAL #1   Title  Patient will stand for 1 hour without self reported increase pain in order to stand at a kitchen sink.    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 She reports improved ability to perfrom  ADL's and social activity. She continues to have pain but there is a significant improvement. She would benefit from further skilled therapy to continue to improve strength and function. Therapy reviewed the decompression position and exercises today. She tolerated them well and reported improved pain with treatment. See bleow for goal specific improvements.     Clinical Presentation  Evolving    Clinical Decision Making  Moderate    Rehab Potential  Good    PT Frequency  2x / week    PT Duration  8 weeks    PT Treatment/Interventions  ADLs/Self Care Home Management;Cryotherapy;Electrical Stimulation;Ultrasound;Moist Heat;Iontophoresis 83m/ml  Dexamethasone;Stair training;Functional mobility training;Therapeutic activities;Therapeutic exercise;Manual techniques;Passive range of motion;Dry needling;Taping    PT Next Visit Plan  continue to review exercise. Progress standing balance exercises - air ex, reactive/ anticipitory activities     PT Home Exercise Plan  supine core breathing with hip marching; standing balance exercises at sink ,  calf stretch with rope.    Consulted and Agree with Plan of Care  Patient       Patient will benefit from skilled therapeutic intervention in order to improve the following deficits and impairments:  Pain, Decreased endurance, Decreased range of motion, Decreased strength, Decreased activity tolerance, Increased muscle spasms  Visit Diagnosis: Chronic bilateral low back pain without sciatica - Plan: PT plan of care cert/re-cert  Muscle weakness (generalized) - Plan: PT plan of care cert/re-cert  Cramp and spasm - Plan: PT plan of care cert/re-cert  Difficulty in walking, not elsewhere classified - Plan: PT plan of care cert/re-cert     Problem List Patient Active Problem List   Diagnosis Date Noted  . Abnormal SPEP 02/19/2018  . Hand pain 02/20/2017  . Multiple joint pain 02/18/2017  . Right elbow pain 12/09/2016  . Disturbed cognition 06/25/2016  . Trochanteric bursitis of left hip 02/18/2016  . Sciatica, right side 10/30/2015  . Bilateral arm pain 10/10/2015  . Trochanteric bursitis of right hip 08/04/2014  . Adjustment disorder with mixed anxiety and depressed mood 08/04/2014  . Chronic fatigue 08/04/2014  . Urinary frequency 08/04/2014  . Abnormality of gait 08/04/2014  . Unspecified visual disturbance 01/31/2013  . Routine general medical examination at a health care facility 11/04/2011  . Colon polyps 11/03/2011  . Hot flashes, menopausal 10/13/2011  . POSTHERPETIC NEURALGIA 10/03/2009  . DISPLCMT LUMBAR INTERVERT [place] MYELOPATHY 09/05/2009  . RENAL CALCULUS, RECURRENT  09/04/2009  . HYPERLIPIDEMIA 08/31/2009  . MULTIPLE SCLEROSIS, PROGRESSIVE/RELAPSING 08/31/2009  . ALLERGIC RHINITIS 08/31/2009    DCarney LivingPT DPT  05/26/2018, 12:40 PM   KEinar CrowSPT 05/26/2018   During this treatment session, the therapist was present, participating in and directing the treatment.   [place] [address]Phone: [phone]  Fax:  [phone] Name: Dawn ULLOAMRN: [id number] of Birth: 6Jul 08, 1947

## 2018-06-02 ENCOUNTER — Ambulatory Visit: Payer: Medicare Other | Admitting: Physical Therapy

## 2018-06-02 ENCOUNTER — Encounter: Payer: Self-pay | Admitting: Physical Therapy

## 2018-06-02 DIAGNOSIS — M6281 Muscle weakness (generalized): Secondary | ICD-10-CM | POA: Diagnosis not present

## 2018-06-02 DIAGNOSIS — M545 Low back pain: Principal | ICD-10-CM

## 2018-06-02 DIAGNOSIS — R262 Difficulty in walking, not elsewhere classified: Secondary | ICD-10-CM | POA: Diagnosis not present

## 2018-06-02 DIAGNOSIS — G8929 Other chronic pain: Secondary | ICD-10-CM | POA: Diagnosis not present

## 2018-06-02 DIAGNOSIS — R252 Cramp and spasm: Secondary | ICD-10-CM | POA: Diagnosis not present

## 2018-06-02 NOTE — Therapy (Addendum)
[place], [place], [address] Phone: [phone]   Fax:  [phone]  Physical Therapy Treatment  Patient Details  Name: Dawn [name] MRN: [id number] Date of Birth: Aug 07, 1945 Referring Provider (PT): Dr Joni Fears    Encounter Date: 06/02/2018  PT End of Session - 06/02/18 1602    Visit Number  17    Number of Visits  24    Date for PT Re-Evaluation  07/21/18    Authorization Type  Medicare     PT Start Time  6962    PT Stop Time  1107    PT Time Calculation (min)  52 min    Activity Tolerance  Patient tolerated treatment well    Behavior During Therapy  [facility] for tasks assessed/performed       Past Medical History:  Diagnosis Date  . Allergy   . Hot flashes, menopausal 10/13/2011   Estradiol started   . Multiple sclerosis ([place])   . Neuropathy   . Osteoarthritis   . Vision abnormalities     Past Surgical History:  Procedure Laterality Date  . ABDOMINAL HYSTERECTOMY  2002   partial  . LUMBAR FUSION      There were no vitals filed for this visit.  Subjective Assessment - 06/02/18 1322    Subjective  Some back pain but more so in the right hip. Really likes the decompressive exercises I got last time as they have seemed to help.     Limitations  Standing;Walking;House hold activities    How long can you stand comfortably?  varys on the time of day and activity     How long can you walk comfortably?  Can walk through the grocery store with a cart     Diagnostic tests  MRI: Multi level degeneration and     Currently in Pain?  No/denies    Pain Score  2     Pain Location  Hip    Pain Orientation  Left;Upper;Lateral    Pain Descriptors / Indicators  Aching    Pain Type  Chronic pain    Pain Onset  More than a month ago    Pain Frequency  Constant    Aggravating Factors   housework, worse as day wears on    Pain Relieving Factors  stretches     Effect of Pain on Daily Activities   still sitting if having to do alot of walking and standing but only have to sit for 2-3 minutes                        [facility] Adult PT Treatment/Exercise - 06/02/18 0001      Neuro Re-ed    Neuro Re-ed Details   standing EC with narrow BOS x30sec; tandem stance bil 3x30sec with pt requirin min assist to regain balance with left foot fwd and right foot behind with EC      Lumbar Exercises: Stretches   Other Lumbar Stretch Exercise  trialed obers test IT band stretch in sidelying x10sec but did not feel stretch laterally only in proximal quad ; piriformis stretch 2x30sec ; single knee to chest stretch 2x30sec      Shoulder Exercises: Seated   Other Seated Exercises  dynodisc seated marches 2x10; dinodisc alt arm raises mid range flexion 2x20sec; dinodisc heeltoe raises x20; dinodisc reaching outside BOS and across midline 2x10 pt required cueing for stability adn core engagement  Cryotherapy   Number Minutes Cryotherapy  10 Minutes    Cryotherapy Location  Hip    Type of Cryotherapy  Ice pack      Manual Therapy   Manual therapy comments  Gentle LAD bilateral; foam rolled lateral quads, IT band, and gluts; manual trigger point release to R glut      Soft tissue mobilization  R glut and lateral quads , TFL             PT Education - 06/02/18 1424    Education Details  Ice for bursitis, anatomy, HEP     Person(s) Educated  Patient    Methods  Explanation;Demonstration;Tactile cues;Verbal cues    Comprehension  Verbalized understanding;Returned demonstration;Verbal cues required       PT Short Term Goals - 06/02/18 1638      PT SHORT TERM GOAL #1   Title  Patient will increase right passive hip flexion to 105 degrees without pain     Status  Achieved      PT SHORT TERM GOAL #2   Title  Patient will increase gross bilateral lower extremity strength to 4+/5     Status  Achieved      PT SHORT TERM GOAL #3   Title  Patient will be independent with basic  HEP     Status  Achieved        PT Long Term Goals - 06/02/18 1638      PT LONG TERM GOAL #1   Title  Patient will stand for 1 hour without self reported increase pain in order to stand at a kitchen sink.    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 Upon palpation believe pt to have trochanteric bursitis. Worked on manual therapy to decrease lateral quad and IT band tihtness and stretching for right hip muscluature. Theapy focused on core stability on dinodisc and standing balance with pt requiring min assist to recover LOB in tandem stance with EC. Ice placed over trochanteric bursa at end of sessoin in order to decrease swelling as pt has been utilizing heat on area.    Simultaneous filing. User may not have seen previous data.   History and Personal Factors relevant to plan of care:  MS, pelvic tear, baseline vertigo     Clinical Presentation  Evolving    Clinical Presentation due to:  pain that is increasing recently as well as pelvic issue that will effect her POC    Clinical Decision Making  Moderate    Rehab Potential  Good    PT Frequency  2x / week    PT Duration  8 weeks    PT Treatment/Interventions  ADLs/Self Care Home Management;Cryotherapy;Electrical Stimulation;Ultrasound;Moist Heat;Iontophoresis 65m/ml Dexamethasone;Stair training;Functional mobility training;Therapeutic activities;Therapeutic exercise;Manual techniques;Passive range  of motion;Dry needling;Taping    PT Next Visit Plan  continue to  review exercise. Progress standing balance exercises - air ex, reactive/ anticipitory activities ; progress to swiss ball as able     PT Home Exercise Plan  supine core breathing with hip marching; standing balance exercises at sink ,  calf stretch with rope.    Consulted and Agree with Plan of Care  Patient       Patient will benefit from skilled therapeutic intervention in order to improve the following deficits and impairments:  Pain, Decreased endurance, Decreased range of motion, Decreased strength, Decreased activity tolerance, Increased muscle spasms  Visit Diagnosis: Chronic bilateral low back pain without sciatica  Muscle weakness (generalized)  Cramp and spasm  Difficulty in walking, not elsewhere classified     Problem List Patient Active Problem List   Diagnosis Date Noted  . Abnormal SPEP 02/19/2018  . Hand pain 02/20/2017  . Multiple joint pain 02/18/2017  . Right elbow pain 12/09/2016  . Disturbed cognition 06/25/2016  . Trochanteric bursitis of left hip 02/18/2016  . Sciatica, right side 10/30/2015  . Bilateral arm pain 10/10/2015  . Trochanteric bursitis of right hip 08/04/2014  . Adjustment disorder with mixed anxiety and depressed mood 08/04/2014  . Chronic fatigue 08/04/2014  . Urinary frequency 08/04/2014  . Abnormality of gait 08/04/2014  . Unspecified visual disturbance 01/31/2013  . Routine general medical examination at a health care facility 11/04/2011  . Colon polyps 11/03/2011  . Hot flashes, menopausal 10/13/2011  . POSTHERPETIC NEURALGIA 10/03/2009  . 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 RENAL CALCULUS, RECURRENT 09/04/2009  . HYPERLIPIDEMIA 08/31/2009  . MULTIPLE SCLEROSIS, PROGRESSIVE/RELAPSING 08/31/2009  . ALLERGIC RHINITIS 08/31/2009   Carolyne Littles PT DPT  06/02/2018  Einar Crow SPT 06/02/2018, 5:47 PM  During this treatment session, the therapist was present, participating in and directing the  treatment.   [place], [place], [address] Phone: [phone]   Fax:  [phone]  Name: Dawn [name] MRN: [id number] Date of Birth: 1946-05-22

## 2018-06-03 ENCOUNTER — Encounter: Payer: Self-pay | Admitting: Physical Therapy

## 2018-06-09 ENCOUNTER — Ambulatory Visit: Payer: Medicare Other | Admitting: Physical Therapy

## 2018-06-09 ENCOUNTER — Encounter: Payer: Self-pay | Admitting: Physical Therapy

## 2018-06-09 DIAGNOSIS — M6281 Muscle weakness (generalized): Secondary | ICD-10-CM | POA: Diagnosis not present

## 2018-06-09 DIAGNOSIS — M545 Low back pain, unspecified: Secondary | ICD-10-CM

## 2018-06-09 DIAGNOSIS — R252 Cramp and spasm: Secondary | ICD-10-CM | POA: Diagnosis not present

## 2018-06-09 DIAGNOSIS — R262 Difficulty in walking, not elsewhere classified: Secondary | ICD-10-CM | POA: Diagnosis not present

## 2018-06-09 DIAGNOSIS — G8929 Other chronic pain: Secondary | ICD-10-CM | POA: Diagnosis not present

## 2018-06-09 NOTE — Therapy (Signed)
[place], [place], [address] Phone: [phone]   Fax:  [phone]  Physical Therapy Treatment  Patient Details  Name: Dawn [name] MRN: [id number] Date of Birth: 1946/04/05 Referring Provider (PT): Dr Joni Fears    Encounter Date: 06/09/2018  PT End of Session - 06/09/18 1114    Visit Number  18    Number of Visits  24    Date for PT Re-Evaluation  07/21/18    Authorization Type  Medicare     PT Start Time  1324    PT Stop Time  1110    PT Time Calculation (min)  55 min    Activity Tolerance  Patient tolerated treatment well    Behavior During Therapy  [facility] for tasks assessed/performed       Past Medical History:  Diagnosis Date  . Allergy   . Hot flashes, menopausal 10/13/2011   Estradiol started   . Multiple sclerosis ([place])   . Neuropathy   . Osteoarthritis   . Vision abnormalities     Past Surgical History:  Procedure Laterality Date  . ABDOMINAL HYSTERECTOMY  2002   partial  . LUMBAR FUSION      There were no vitals filed for this visit.  Subjective Assessment - 06/09/18 1021    Subjective  Did yard work Sunday and then sat on heating pad after. Realized she recovered faster on Sunday even though still had some pain after yard work. Not much pain today.     Limitations  Standing;Walking;House hold activities    How long can you stand comfortably?  varys on the time of day and activity     How long can you walk comfortably?  Can walk through the grocery store with a cart     Diagnostic tests  MRI: Multi level degeneration and     Currently in Pain?  No/denies    Pain Type  Chronic pain    Pain Onset  More than a month ago    Pain Frequency  Intermittent    Aggravating Factors   hoursework, worse as day wears on     Pain Relieving Factors  stretches     Effect of Pain on Daily Activities  still sitting if having to do alot of walking and standing but only have to sit for  2-3 minutes                        [facility] Adult PT Treatment/Exercise - 06/09/18 0001      Lumbar Exercises: Supine   Clam Limitations  1x15 red     Bent Knee Raise Limitations  with red t-band x15 Bil    Other Supine Lumbar Exercises  ball squeezes 1x15      Shoulder Exercises: Seated   Other Seated Exercises  2x10 wtith bil abduction ; 2x10 with external rotation Bil with cues for core and scapular engagement       Moist Heat Therapy   Number Minutes Moist Heat  10 Minutes    Moist Heat Location  Cervical;Lumbar Spine      Manual Therapy   Manual therapy comments  Gentle LAD bilateral; skilled palpation of trigger points    Soft tissue mobilization  R glut and lateral quads , TFL       Trigger Point Dry Needling - 06/09/18 1424    Consent Given?  Yes    Muscles Treated Lower Body  Piriformis  Gluteus Minimus Response  Twitch response elicited    Piriformis Response  Twitch response elicited           PT Education - 06/09/18 1113    Education Details  Reviewed HEP    Person(s) Educated  Patient    Methods  Explanation;Demonstration;Tactile cues;Verbal cues    Comprehension  Verbalized understanding;Returned demonstration       PT Short Term Goals - 06/02/18 1638      PT SHORT TERM GOAL #1   Title  Patient will increase right passive hip flexion to 105 degrees without pain     Status  Achieved      PT SHORT TERM GOAL #2   Title  Patient will increase gross bilateral lower extremity strength to 4+/5     Status  Achieved      PT SHORT TERM GOAL #3   Title  Patient will be independent with basic HEP     Status  Achieved        PT Long Term Goals - 06/09/18 1105      PT LONG TERM GOAL #1   Title  Patient will stand for 1 hour without self reported increase pain in order to stand at a kitchen sink.    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 She reported a significant improvement in her hip pain. She also tolerated ther-ex well. Therapy will continue to needle her piriformis for a few more visits. Therapy will continue to focus on buiding her a complete HEP     PT Treatment/Interventions  ADLs/Self Care Home Management;Cryotherapy;Electrical Stimulation;Ultrasound;Moist Heat;Iontophoresis 23m/ml Dexamethasone;Stair training;Functional mobility training;Therapeutic activities;Therapeutic exercise;Manual techniques;Passive range of motion;Dry needling;Taping    PT Next Visit Plan  continue to review exercise. Progress standing balance exercises - air ex, reactive/ anticipitory activities ; progress to swiss ball as able     PT Home Exercise Plan  supine core breathing with hip marching; standing balance exercises at sink ,  calf stretch with rope.    Consulted and Agree with Plan of Care  Patient       Patient will benefit from skilled therapeutic intervention in order to improve the following deficits and impairments:     Visit Diagnosis: Chronic bilateral low back pain without sciatica  Muscle weakness (generalized)  Cramp and spasm  Difficulty in walking, not elsewhere classified     Problem List Patient Active Problem List   Diagnosis Date Noted  . Abnormal SPEP 02/19/2018  . Hand pain 02/20/2017  . Multiple joint pain 02/18/2017  . Right elbow pain 12/09/2016  . Disturbed cognition 06/25/2016  . Trochanteric bursitis of left  hip 02/18/2016  . Sciatica, right side 10/30/2015  .  Bilateral arm pain 10/10/2015  . Trochanteric bursitis of right hip 08/04/2014  . Adjustment disorder with mixed anxiety and depressed mood 08/04/2014  . Chronic fatigue 08/04/2014  . Urinary frequency 08/04/2014  . Abnormality of gait 08/04/2014  . Unspecified visual disturbance 01/31/2013  . Routine general medical examination at a health care facility 11/04/2011  . Colon polyps 11/03/2011  . Hot flashes, menopausal 10/13/2011  . POSTHERPETIC NEURALGIA 10/03/2009  . DISPLCMT LUMBAR INTERVERT [place] W/O MYELOPATHY 09/05/2009  . RENAL CALCULUS, RECURRENT 09/04/2009  . HYPERLIPIDEMIA 08/31/2009  . MULTIPLE SCLEROSIS, PROGRESSIVE/RELAPSING 08/31/2009  . ALLERGIC RHINITIS 08/31/2009    Carney Living PT DPT  06/09/2018, 2:26 PM  Einar Crow SPT  11/20/.2019   During this treatment session, the therapist was present, participating in and directing the treatment.  [place], [place], [address] Phone: [phone]   Fax:  [phone]  Name: Dawn [name] MRN: [id number] Date of Birth: 1946-01-19

## 2018-06-16 ENCOUNTER — Encounter: Payer: Medicare Other | Admitting: Physical Therapy

## 2018-06-23 ENCOUNTER — Ambulatory Visit: Payer: Medicare Other | Attending: Orthopaedic Surgery | Admitting: Physical Therapy

## 2018-06-23 ENCOUNTER — Encounter: Payer: Self-pay | Admitting: Physical Therapy

## 2018-06-23 DIAGNOSIS — R252 Cramp and spasm: Secondary | ICD-10-CM | POA: Diagnosis not present

## 2018-06-23 DIAGNOSIS — R262 Difficulty in walking, not elsewhere classified: Secondary | ICD-10-CM | POA: Insufficient documentation

## 2018-06-23 DIAGNOSIS — M6281 Muscle weakness (generalized): Secondary | ICD-10-CM

## 2018-06-23 DIAGNOSIS — G8929 Other chronic pain: Secondary | ICD-10-CM | POA: Insufficient documentation

## 2018-06-23 DIAGNOSIS — M545 Low back pain: Secondary | ICD-10-CM | POA: Diagnosis not present
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
## 2018-06-29 ENCOUNTER — Ambulatory Visit: Payer: Medicare Other | Admitting: Physical Therapy

## 2018-06-29 ENCOUNTER — Encounter: Payer: Self-pay | Admitting: Physical Therapy

## 2018-06-29 DIAGNOSIS — M6281 Muscle weakness (generalized): Secondary | ICD-10-CM | POA: Diagnosis not present

## 2018-06-29 DIAGNOSIS — G8929 Other chronic pain: Secondary | ICD-10-CM | POA: Diagnosis not present

## 2018-06-29 DIAGNOSIS — M545 Low back pain, unspecified: Secondary | ICD-10-CM

## 2018-06-29 DIAGNOSIS — R262 Difficulty in walking, not elsewhere classified: Secondary | ICD-10-CM | POA: Diagnosis not present

## 2018-06-29 DIAGNOSIS — R252 Cramp and spasm: Secondary | ICD-10-CM

## 2018-06-29 NOTE — Therapy (Signed)
[place], [place], [address] Phone: [phone]   Fax:  [phone]  Physical Therapy Treatment  Patient Details  Name: RAMIYA DELAHUNTY MRN: [id number] Date of Birth: 1946-05-25 Referring Provider (PT): Dr Joni Fears    Encounter Date: 06/29/2018  PT End of Session - 06/29/18 1130    Visit Number  20    Number of Visits  24    Date for PT Re-Evaluation  07/21/18    Authorization Type  Medicare     PT Start Time  1012    PT Stop Time  1102    PT Time Calculation (min)  50 min    Activity Tolerance  Patient tolerated treatment well    Behavior During Therapy  [facility] for tasks assessed/performed       Past Medical History:  Diagnosis Date  . Allergy   . Hot flashes, menopausal 10/13/2011   Estradiol started   . Multiple sclerosis ([place])   . Neuropathy   . Osteoarthritis   . Vision abnormalities     Past Surgical History:  Procedure Laterality Date  . ABDOMINAL HYSTERECTOMY  2002   partial  . LUMBAR FUSION      There were no vitals filed for this visit.  Subjective Assessment - 06/29/18 1323    Subjective  Patient ffeels like her foot has improved. She is still having pain in her foot when she stands too long. Her back is doing well. Her shoulders feel better.     How long can you stand comfortably?  varys on the time of day and activity     How long can you walk comfortably?  Can walk through the grocery store with a cart     Diagnostic tests  MRI: Multi level degeneration and     Currently in Pain?  Yes    Pain Score  3     Pain Location  Foot    Pain Orientation  Right    Pain Descriptors / Indicators  Aching;Sore    Pain Type  Chronic pain    Pain Onset  More than a month ago    Pain Frequency  Intermittent    Aggravating Factors   housework,     Pain Relieving Factors  stretches     Effect of Pain on Daily Activities  still having pain if she sits a lot                         [facility] Adult PT Treatment/Exercise - 06/29/18 0001      Lumbar Exercises: Supine   Other Supine Lumbar Exercises  seated heel raise 2x10; seated windshield wiper 2x10     Other Supine Lumbar Exercises  seated on disc; bilateral er 2x10 yellow; seated shoulder abduction 2x10 yellow; seated shoulder flexion x10 bilateral; seated reach out x10 bilateral; increased speed low range shoulder flexion       Moist Heat Therapy   Number Minutes Moist Heat  10 Minutes    Moist Heat Location  Lumbar Spine      Manual Therapy   Manual Therapy  Joint mobilization    Manual therapy comments  Gentle LAD bilateral; skilled palpation of trigger points ; IASTM to left and right paraspinals and QL; passive R hip flexion; manual R plantar stretch 3x30sec    Joint Mobilization  R hip AP mob grade 2 &3 , grade 1& 2 AP mob to 4th and 5th  metatarsal, distraction of 4th and 5th phalanges,retrograde msasage to lateral R foot      Ankle Exercises: Stretches   Plantar Fascia Stretch Limitations  3x30sec long sitting with towel             PT Education - 06/29/18 1325    Education Details  reviewed HEP, symptom mangement, benefits of core exercises.     Person(s) Educated  Patient    Methods  Explanation;Demonstration;Tactile cues;Verbal cues    Comprehension  Verbalized understanding;Returned demonstration;Verbal cues required;Tactile cues required;Need further instruction       PT Short Term Goals - 06/29/18 1326      PT SHORT TERM GOAL #1   Title  Patient will increase right passive hip flexion to 105 degrees without pain     Baseline  105    Time  4    Period  Weeks    Status  Achieved      PT SHORT TERM GOAL #2   Title  Patient will increase gross bilateral lower extremity strength to 4+/5     Baseline  4/5 hip flexion 4+/5 hip abduction     Time  4    Period  Weeks    Status  Achieved      PT SHORT TERM GOAL #3   Title  Patient will be independent with  basic HEP     Baseline  independnet with exercises given to her at this time     Time  4    Period  Weeks    Status  Achieved        PT Long Term Goals - 06/29/18 1326      PT LONG TERM GOAL #1   Title  Patient will stand for 1 hour without self reported increase pain in order to stand at a kitchen sink.    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 She had some pain with palpation of her cuboid. Therapy also focused on core balance with her doing exercises on the disc. She was able to get back to her shoulder extercises. The patient has 2 more visit scheduled. Over the next two visits will work on making sure she is comofrtable with her plan going forward. Therapy gave her light ankle strengthening for home.     Clinical Presentation  Evolving    Clinical Decision Making  Moderate    Rehab Potential  Good    PT Frequency  2x / week    PT Duration  8 weeks  PT Treatment/Interventions  ADLs/Self Care Home Management;Cryotherapy;Electrical Stimulation;Ultrasound;Moist Heat;Iontophoresis 4mg /ml Dexamethasone;Stair training;Functional mobility training;Therapeutic activities;Therapeutic exercise;Manual techniques;Passive range of motion;Dry needling;Taping    PT Next Visit Plan  continue to review exercise. Progress standing balance exercises - air ex, reactive/ anticipitory activities ; progress to  swiss ball as able ; progress back into UE and postural exercises as bicep pain subsides    PT Home Exercise Plan  supine core breathing with hip marching; standing balance exercises at sink ,  calf stretch with rope.    Consulted and Agree with Plan of Care  Patient       Patient will benefit from skilled therapeutic intervention in order to improve the following deficits and impairments:  Pain, Decreased endurance, Decreased range of motion, Decreased strength, Decreased activity tolerance, Increased muscle spasms  Visit Diagnosis: Chronic bilateral low back pain without sciatica  Muscle weakness (generalized)  Cramp and spasm  Difficulty in walking, not elsewhere classified     Problem List Patient Active Problem List   Diagnosis Date Noted  . Abnormal SPEP 02/19/2018  . Hand pain 02/20/2017  . Multiple joint pain 02/18/2017  . Right elbow pain 12/09/2016  . Disturbed cognition 06/25/2016  . Trochanteric bursitis of left hip 02/18/2016  . Sciatica, right side 10/30/2015  . Bilateral arm pain 10/10/2015  . Trochanteric bursitis of right hip 08/04/2014  . Adjustment disorder with mixed anxiety and depressed mood 08/04/2014  . Chronic fatigue 08/04/2014  . Urinary frequency 08/04/2014  . Abnormality of gait 08/04/2014  . Unspecified visual disturbance 01/31/2013  . Routine general medical examination at a health care facility 11/04/2011  . Colon polyps 11/03/2011  . Hot flashes, menopausal 10/13/2011  . POSTHERPETIC NEURALGIA 10/03/2009  . DISPLCMT LUMBAR INTERVERT [place] W/O MYELOPATHY 09/05/2009  . RENAL CALCULUS, RECURRENT 09/04/2009  . HYPERLIPIDEMIA 08/31/2009  . MULTIPLE SCLEROSIS, PROGRESSIVE/RELAPSING 08/31/2009  . ALLERGIC RHINITIS 08/31/2009    Carney Living PT DPT  06/29/2018, 1:29 PM  [facility] [address] [place], [place], [address] Phone: [phone]   Fax:  [phone]  Name: DEEDRA PRO MRN: [id number] Date of Birth: 1945/09/18

## 2018-07-06 ENCOUNTER — Ambulatory Visit: Payer: Medicare Other | Admitting: Physical Therapy

## 2018-07-13 ENCOUNTER — Encounter: Payer: Self-pay | Admitting: Physical Therapy

## 2018-07-13 ENCOUNTER — Ambulatory Visit: Payer: Medicare Other | Admitting: Physical Therapy

## 2018-07-13 DIAGNOSIS — R262 Difficulty in walking, not elsewhere classified: Secondary | ICD-10-CM

## 2018-07-13 DIAGNOSIS — M6281 Muscle weakness (generalized): Secondary | ICD-10-CM

## 2018-07-13 DIAGNOSIS — M545 Low back pain, unspecified: Secondary | ICD-10-CM

## 2018-07-13 DIAGNOSIS — R252 Cramp and spasm: Secondary | ICD-10-CM

## 2018-07-13 DIAGNOSIS — G8929 Other chronic pain: Secondary | ICD-10-CM

## 2018-07-13 NOTE — Therapy (Signed)
[place], [place], [address] Phone: [phone]   Fax:  [phone]  Physical Therapy Treatment  Patient Details  Name: Dawn [name] MRN: [id number] Date of Birth: [birth year] Referring Provider (PT): Dr Joni Fears    Encounter Date: 07/13/2018  PT End of Session - 07/13/18 1254    Visit Number  21    Number of Visits  24    Date for PT Re-Evaluation  07/21/18    Authorization Type  Medicare     PT Start Time  3382    PT Stop Time  1055    PT Time Calculation (min)  40 min       Past Medical History:  Diagnosis Date  . Allergy   . Hot flashes, menopausal 10/13/2011   Estradiol started   . Multiple sclerosis ([place])   . Neuropathy   . Osteoarthritis   . Vision abnormalities     Past Surgical History:  Procedure Laterality Date  . ABDOMINAL HYSTERECTOMY  2002   partial  . LUMBAR FUSION      There were no vitals filed for this visit.  Subjective Assessment - 07/13/18 1022    Subjective  Patient reports overall she is doing well. Her hips hurt   (Pended)     Limitations  Standing;Walking;House hold activities  (Pended)     How long can you stand comfortably?  varys on the time of day and activity   (Pended)     How long can you walk comfortably?  Can walk through the grocery store with a cart   (Pended)     Diagnostic tests  MRI: Multi level degeneration and   (Pended)     Currently in Pain?  Yes  (Pended)                        [facility] Adult PT Treatment/Exercise - 07/13/18 0001      Manual Therapy   Manual Therapy  Joint mobilization    Manual therapy comments  Gentle LAD bilateral; skilled palpation of trigger points ; IASTM to left and right paraspinals and QL; passive R hip flexion; manual R plantar stretch 3x30sec    Joint Mobilization  R hip AP mob grade 2 &3 , grade 1& 2 AP mob to 4th and 5th metatarsal, distraction of 4th and 5th phalanges,retrograde msasage  to lateral R foot    Soft tissue mobilization  right platar facia stretch and trigger point release                PT Short Term Goals - 06/29/18 1326      PT SHORT TERM GOAL #1   Title  Patient will increase right passive hip flexion to 105 degrees without pain     Baseline  105    Time  4    Period  Weeks    Status  Achieved      PT SHORT TERM GOAL #2   Title  Patient will increase gross bilateral lower extremity strength to 4+/5     Baseline  4/5 hip flexion 4+/5 hip abduction     Time  4    Period  Weeks    Status  Achieved      PT SHORT TERM GOAL #3   Title  Patient will be independent with basic HEP     Baseline  independnet with exercises given to her at this time  Time  4    Period  Weeks    Status  Achieved        PT Long Term Goals - 06/29/18 1326      PT LONG TERM GOAL #1   Title  Patient will stand for 1 hour without self reported increase pain in order to stand at a kitchen sink.    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 Therapy focused on manual therapy and needling today. Therapy will review final HEP in preperation for D.C next visit.     Clinical Presentation  Evolving    Clinical Decision Making  Moderate    Rehab Potential  Good    PT Frequency  2x / week    PT Duration  8 weeks    PT Treatment/Interventions  ADLs/Self Care Home  Management;Cryotherapy;Electrical Stimulation;Ultrasound;Moist Heat;Iontophoresis 4mg /ml Dexamethasone;Stair training;Functional mobility training;Therapeutic activities;Therapeutic exercise;Manual techniques;Passive range of motion;Dry needling;Taping    PT Next Visit Plan  continue to review exercise. Progress standing balance exercises - air ex, reactive/ anticipitory activities ; progress to swiss ball as able ; progress back into UE and postural exercises as bicep pain subsides    PT Home Exercise Plan  supine core breathing with hip marching; standing balance exercises at sink ,  calf stretch with rope.    Consulted and Agree with Plan of Care  Patient       Patient will benefit from skilled therapeutic intervention in order to improve the following deficits and impairments:  Pain, Decreased endurance, Decreased range of motion, Decreased strength, Decreased activity tolerance, Increased muscle spasms  Visit Diagnosis: Chronic bilateral low back pain without sciatica  Muscle weakness (generalized)  Cramp and spasm  Difficulty in walking, not elsewhere classified     Problem List Patient Active Problem List   Diagnosis Date Noted  . Abnormal SPEP 02/19/2018  . Hand pain 02/20/2017  . Multiple joint pain 02/18/2017  . Right elbow pain 12/09/2016  . Disturbed cognition 06/25/2016  . Trochanteric bursitis of left hip 02/18/2016  . Sciatica, right side 10/30/2015  . Bilateral arm pain 10/10/2015  . Trochanteric bursitis of right hip 08/04/2014  . Adjustment disorder with mixed anxiety and depressed mood 08/04/2014  . Chronic fatigue 08/04/2014  . Urinary frequency 08/04/2014  . Abnormality of gait 08/04/2014  . Unspecified visual disturbance 01/31/2013  . Routine general medical examination at a health care facility 11/04/2011  . Colon polyps 11/03/2011  . Hot flashes, menopausal 10/13/2011  . POSTHERPETIC NEURALGIA 10/03/2009  . DISPLCMT LUMBAR INTERVERT  [place] W/O  MYELOPATHY 09/05/2009  . RENAL CALCULUS, RECURRENT 09/04/2009  . HYPERLIPIDEMIA 08/31/2009  . MULTIPLE SCLEROSIS, PROGRESSIVE/RELAPSING 08/31/2009  . ALLERGIC RHINITIS 08/31/2009    Carney Living PT DPT  07/13/2018, 12:56 PM  [facility] [address] [place], [place], [address] Phone: [phone]   Fax:  [phone]  Name: KINDSEY EBLIN MRN: [id number] Date of Birth: 1946-01-05

## 2018-08-02 ENCOUNTER — Encounter: Payer: Self-pay | Admitting: Neurology

## 2018-08-02 DIAGNOSIS — Z961 Presence of intraocular lens: Secondary | ICD-10-CM | POA: Diagnosis not present

## 2018-08-02 DIAGNOSIS — G35 Multiple sclerosis: Secondary | ICD-10-CM | POA: Diagnosis not present

## 2018-08-02 DIAGNOSIS — Z8669 Personal history of other diseases of the nervous system and sense organs: Secondary | ICD-10-CM | POA: Diagnosis not present

## 2018-08-02 DIAGNOSIS — I7090 Unspecified atherosclerosis: Secondary | ICD-10-CM | POA: Diagnosis not present

## 2018-08-02 DIAGNOSIS — H26493 Other secondary cataract, bilateral: Secondary | ICD-10-CM | POA: Diagnosis not present

## 2018-08-04 ENCOUNTER — Encounter: Payer: Self-pay | Admitting: Physical Therapy

## 2018-08-04 ENCOUNTER — Ambulatory Visit: Payer: Medicare Other | Attending: Orthopaedic Surgery | Admitting: Physical Therapy

## 2018-08-04 DIAGNOSIS — G8929 Other chronic pain: Secondary | ICD-10-CM | POA: Diagnosis not present

## 2018-08-04 DIAGNOSIS — R252 Cramp and spasm: Secondary | ICD-10-CM

## 2018-08-04 DIAGNOSIS — M6281 Muscle weakness (generalized): Secondary | ICD-10-CM | POA: Insufficient documentation

## 2018-08-04 DIAGNOSIS — R262 Difficulty in walking, not elsewhere classified: Secondary | ICD-10-CM

## 2018-08-04 DIAGNOSIS — M545 Low back pain: Secondary | ICD-10-CM | POA: Diagnosis not present

## 2018-08-04 NOTE — Therapy (Signed)
[place], [place], [address] Phone: [phone]   Fax:  [phone]  Physical Therapy Treatment/Progress Note   Patient Details  Name: Dawn [name] MRN: [id number] Date of Birth: 1945-09-17 Referring Provider (PT): Dr Joni Fears    Encounter Date: 08/04/2018   Progress Note Reporting Period 08/04/2018  to 09/01/2018  See note below for Objective Data and Assessment of Progress/Goals.       PT End of Session - 08/04/18 1019    Visit Number  22    Number of Visits  26    Date for PT Re-Evaluation  09/01/18    Authorization Type  Medicare     PT Start Time  1013    PT Stop Time  1057    PT Time Calculation (min)  44 min    Activity Tolerance  Patient tolerated treatment well    Behavior During Therapy  WFL for tasks assessed/performed       Past Medical History:  Diagnosis Date  . Allergy   . Hot flashes, menopausal 10/13/2011   Estradiol started   . Multiple sclerosis ([place])   . Neuropathy   . Osteoarthritis   . Vision abnormalities     Past Surgical History:  Procedure Laterality Date  . ABDOMINAL HYSTERECTOMY  2002   partial  . LUMBAR FUSION      There were no vitals filed for this visit.  Subjective Assessment - 08/04/18 1017    Subjective  Patient was feeling very good until the past 3 days. She is now having pain in her hip and also pain on the left side of her neck.     Limitations  Standing;Walking;House hold activities    How long can you stand comfortably?  varys on the time of day and activity     How long can you walk comfortably?  Can walk through the grocery store with a cart     Diagnostic tests  MRI: Multi level degeneration and     Currently in Pain?  Yes    Pain Score  6     Pain Location  Hip    Pain Orientation  Right    Pain Descriptors / Indicators  Aching    Pain Type  Chronic pain    Pain Onset  More than a month ago    Pain Frequency  Constant     Aggravating Factors   housework     Pain Relieving Factors  strethces     Effect of Pain on Daily Activities  still having pain          [facility] PT Assessment - 08/04/18 0001      AROM   Lumbar Flexion  63    Lumbar Extension  20      PROM   Overall PROM Comments  110 degrees PROM       Strength   Left Hip Flexion  4+/5    Left Hip ABduction  4+/5    Left Hip ADduction  4+/5      Right Hip   Right Hip Flexion  108      Palpation   Palpation comment  tenderness to palpation in her left hip and IT band                    [facility] Adult PT Treatment/Exercise - 08/04/18 0001      Lumbar Exercises: Stretches   Passive Hamstring Stretch  2 reps;20 seconds;Right;Left  Piriformis Stretch  3 reps;20 seconds;Right;Left      Lumbar Exercises: Supine   Clam Limitations  2x10 red       Moist Heat Therapy   Number Minutes Moist Heat  --   Hot pack machine broken      Manual Therapy   Manual Therapy  Joint mobilization    Manual therapy comments  Gentle LAD bilateral; skilled palpation of trigger points ; IASTM to left and right paraspinals and QL; passive R hip flexion; manual R plantar stretch 3x30sec    Joint Mobilization  R hip AP mob grade 2 &3 , grade 1& 2 AP mob to 4th and 5th metatarsal, distraction of 4th and 5th phalanges,retrograde msasage to lateral R foot    Soft tissue mobilization  right platar facia stretch and trigger point release        Trigger Point Dry Needling - 08/04/18 1336    Consent Given?  Yes    Gluteus Minimus Response  Twitch response elicited   glut medius    Piriformis Response  Twitch response elicited           PT Education - 08/04/18 1018    Education Details  reviewed HEP and symptom mangement     Person(s) Educated  Patient    Methods  Explanation;Demonstration;Tactile cues;Verbal cues    Comprehension  Verbalized understanding;Returned demonstration;Tactile cues required;Verbal cues required;Need further instruction        PT Short Term Goals - 08/04/18 1506      PT SHORT TERM GOAL #1   Title  Patient will increase right passive hip flexion to 105 degrees without pain     Baseline  105    Time  4    Period  Weeks    Status  Achieved      PT SHORT TERM GOAL #2   Title  Patient will increase gross bilateral lower extremity strength to 4+/5     Baseline  4/5 hip flexion 4+/5 hip abduction     Time  4    Period  Weeks    Status  Achieved      PT SHORT TERM GOAL #3   Title  Patient will be independent with basic HEP     Baseline  independnet with exercises given to her at this time     Time  4    Period  Weeks    Status  Achieved        PT Long Term Goals - 08/04/18 1507      PT LONG TERM GOAL #1   Title  Patient will stand for 1 hour without self reported increase pain in order to stand at a kitchen sink.    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 She has increased soreness in her left hip andalso in her neck. Her objective measures overall have improved significantly since her inital evalaution. She would benefit from skilled therapy for 2-3 more visits to reduce pain from this acute flair-up. Patient felt better after needling and stretching today. Patient also given resourses for massage and     Clinical Presentation  Evolving    Clinical Decision Making  Moderate     Rehab Potential  Good    PT Frequency  1x / week    PT Duration  4 weeks    PT Treatment/Interventions  ADLs/Self Care Home Management;Cryotherapy;Electrical Stimulation;Ultrasound;Moist Heat;Iontophoresis 4mg /ml Dexamethasone;Stair training;Functional mobility training;Therapeutic activities;Therapeutic exercise;Manual techniques;Passive range of motion;Dry needling;Taping    PT Next Visit Plan  continue to review exercise. Progress standing balance exercises - air ex, reactive/ anticipitory activities ; progress to swiss ball as able ; progress back into UE and postural exercises as bicep pain subsides    PT Home Exercise Plan  supine core breathing with hip marching; standing balance exercises at sink ,  calf stretch with rope.    Consulted and Agree with Plan of Care  Patient       Patient will benefit from skilled therapeutic intervention in order to improve the following deficits and impairments:  Pain, Decreased endurance, Decreased range of motion, Decreased strength, Decreased activity tolerance, Increased muscle spasms  Visit Diagnosis: Chronic bilateral low back pain without sciatica  Muscle weakness (generalized)  Cramp and spasm  Difficulty in walking, not elsewhere classified     Problem List Patient Active Problem List   Diagnosis Date Noted  . Abnormal SPEP 02/19/2018  . Hand pain 02/20/2017  . Multiple joint pain 02/18/2017  . Right elbow pain 12/09/2016  . Disturbed cognition 06/25/2016  . Trochanteric bursitis of left hip 02/18/2016  . Sciatica, right side 10/30/2015  . Bilateral arm pain 10/10/2015  . Trochanteric bursitis of right hip 08/04/2014  . Adjustment disorder with mixed anxiety and depressed mood 08/04/2014  . Chronic fatigue 08/04/2014  . Urinary frequency 08/04/2014  . Abnormality of gait 08/04/2014  . Unspecified visual disturbance 01/31/2013  . Routine general medical examination at a health care facility 11/04/2011  . Colon polyps  11/03/2011  . Hot flashes, menopausal 10/13/2011  . POSTHERPETIC NEURALGIA 10/03/2009  . DISPLCMT LUMBAR INTERVERT [place] W/O MYELOPATHY 09/05/2009  . RENAL CALCULUS, RECURRENT 09/04/2009  . HYPERLIPIDEMIA 08/31/2009  . MULTIPLE SCLEROSIS, PROGRESSIVE/RELAPSING 08/31/2009  . ALLERGIC RHINITIS 08/31/2009    Carney Living PT DPT  08/04/2018, 3:10 PM  [facility] [address] [place], [place], [address] Phone: [phone]   Fax:  [phone]  Name: ANEYA DADDONA MRN: [id number] Date of Birth: [birth year]

## 2018-08-05 ENCOUNTER — Ambulatory Visit (INDEPENDENT_AMBULATORY_CARE_PROVIDER_SITE_OTHER): Payer: Medicare Other | Admitting: Orthopaedic Surgery

## 2018-08-05 ENCOUNTER — Encounter (INDEPENDENT_AMBULATORY_CARE_PROVIDER_SITE_OTHER): Payer: Self-pay | Admitting: Orthopaedic Surgery

## 2018-08-05 ENCOUNTER — Telehealth (INDEPENDENT_AMBULATORY_CARE_PROVIDER_SITE_OTHER): Payer: Self-pay | Admitting: Orthopaedic Surgery

## 2018-08-05 VITALS — BP 171/99 | HR 67 | Wt 180.0 lb

## 2018-08-05 DIAGNOSIS — G8929 Other chronic pain: Secondary | ICD-10-CM

## 2018-08-05 DIAGNOSIS — M5442 Lumbago with sciatica, left side: Secondary | ICD-10-CM | POA: Diagnosis not present

## 2018-08-05 MED ORDER — METHYLPREDNISOLONE 4 MG PO TBPK: ORAL_TABLET | ORAL | 0 refills | Status: DC

## 2018-08-05 NOTE — Progress Notes (Signed)
Office Visit Note   Patient: Dawn [name]           Date of Birth: July 31, 1945           MRN: [id number] Visit Date: 08/05/2018              Requested by: Britt Bottom, MD [address] [place], [place] [address] PCP: Britt Bottom, MD   Assessment & Plan: Visit Diagnoses:  1. Chronic left-sided low back pain with left-sided sciatica     Plan: Acute exacerbation of low back pain over the last several days predominately to the left with radicular discomfort as far distally as the left ankle.  Will prescribe Medrol Dosepak and evaluate over the next several weeks if no improvement.  Had MRI scan performed last year demonstrating mild canal stenosis at L2-3 and L3-4 and moderate foraminal stenosis at L3-4 to the right.  I think her symptoms are referable to the spinal stenosis.  Films of her pelvis were negative on the left but did have arthritis on the right where she is not symptomatic  Follow-Up Instructions: Return if symptoms worsen or fail to improve.   Orders:  No orders of the defined types were placed in this encounter.  Meds ordered this encounter  Medications  . methylPREDNISolone (MEDROL DOSEPAK) 4 MG TBPK tablet    Sig: Take as directed    Dispense:  21 tablet    Refill:  0      Procedures: No procedures performed   Clinical Data: No additional findings.   Subjective: Chief Complaint  Patient presents with  . Lower Back - Follow-up, Pain  . Follow-up    Lower back pain---dull, throbbing, sharp pain going down the leg. Physical therapy 1x week  Exacerbation of chronic low back pain within the last several days localizing to the left with lateral left hip pain, groin pain and discomfort in her thigh and calf with some numbness and tingling.  Had an MRI scan last year demonstrating degenerative changes at several levels with mild stenosis at L3-4.  Done well with the course of physical therapy and over-the-counter medicines.  Does have a history of MS  being followed by neurology.  Also was had a prior abnormal serum protein electrophoresis.  The oncologist felt that it was basically normal No right-sided symptoms.  HPI  Review of Systems  Constitutional: Negative.   HENT: Negative.   Eyes: Negative.   Respiratory: Negative.   Cardiovascular: Negative.   Gastrointestinal: Negative.   Endocrine: Negative.   Genitourinary: Negative.   Musculoskeletal: Positive for back pain.  Skin: Negative.   Allergic/Immunologic: Negative.   Neurological: Negative.   Hematological: Negative.   Psychiatric/Behavioral: Negative.      Objective: Vital Signs: BP (!) 171/99   Pulse 67   Wt 180 lb (81.6 kg)   BMI 32.92 kg/m   Physical Exam Constitutional:      Appearance: She is well-developed.  Eyes:     Pupils: Pupils are equal, round, and reactive to light.  Pulmonary:     Effort: Pulmonary effort is normal.  Skin:    General: Skin is warm and dry.  Neurological:     Mental Status: She is alert and oriented to person, place, and time.  Psychiatric:        Behavior: Behavior normal.     Ortho Exam awake alert and comfortable sitting.  No pain with range of motion of her left hip.  Some discomfort over the lateral  aspect of her hip and some area in the left paralumbar region.  No masses.  Straight leg raise negative. Specialty Comments:  No specialty comments available.  Imaging: No results found.   PMFS History: Patient Active Problem List   Diagnosis Date Noted  . Abnormal SPEP 02/19/2018  . Hand pain 02/20/2017  . Multiple joint pain 02/18/2017  . Right elbow pain 12/09/2016  . Disturbed cognition 06/25/2016  . Trochanteric bursitis of left hip 02/18/2016  . Sciatica, right side 10/30/2015  . Bilateral arm pain 10/10/2015  . Trochanteric bursitis of right hip 08/04/2014  . Adjustment disorder with mixed anxiety and depressed mood 08/04/2014  . Chronic fatigue 08/04/2014  . Urinary frequency 08/04/2014  .  Abnormality of gait 08/04/2014  . Unspecified visual disturbance 01/31/2013  . Routine general medical examination at a health care facility 11/04/2011  . Colon polyps 11/03/2011  . Hot flashes, menopausal 10/13/2011  . POSTHERPETIC NEURALGIA 10/03/2009  . DISPLCMT LUMBAR INTERVERT [place] W/O MYELOPATHY 09/05/2009  . RENAL CALCULUS, RECURRENT 09/04/2009  . HYPERLIPIDEMIA 08/31/2009  . MULTIPLE SCLEROSIS, PROGRESSIVE/RELAPSING 08/31/2009  . ALLERGIC RHINITIS 08/31/2009   Past Medical History:  Diagnosis Date  . Allergy   . Hot flashes, menopausal 10/13/2011   Estradiol started   . Multiple sclerosis ([place])   . Neuropathy   . Osteoarthritis   . Vision abnormalities     Family History  Problem Relation Age of Onset  . Heart failure Mother   . Heart failure Father   . Arthritis Unknown   . Hypertension Unknown     Past Surgical History:  Procedure Laterality Date  . ABDOMINAL HYSTERECTOMY  2002   partial  . LUMBAR FUSION     Social History   Occupational History  . Occupation: retired  Tobacco Use  . Smoking status: Former Smoker    Packs/day: 0.50    Years: 15.00    Pack years: 7.50    Types: Cigarettes    Last attempt to quit: 08/05/1983    Years since quitting: 35.0  . Smokeless tobacco: Never Used  Substance and Sexual Activity  . Alcohol use: Yes    Alcohol/week: 0.0 standard drinks    Comment: rarely  . Drug use: No  . Sexual activity: Not Currently    Birth control/protection: Post-menopausal

## 2018-08-05 NOTE — Telephone Encounter (Signed)
I called patient, worked in at end of day 08/05/2018

## 2018-08-05 NOTE — Telephone Encounter (Signed)
Patient called stating she woke up 3 days ago with low back pain.  Patient states she has been going to physical therapy and took Tylenol with little relief.  Patient states today she is experiencing "severe back pain."  Patient scheduled an appointment with Dr. Durward Fortes on Monday 08/09/18 at 11:30 and also put on the cancellation list, but is not sure if she can wait that long.  Patient states she doesn't want to go to Urgent Care because they don't know her history.  Patient requested a return call.

## 2018-08-06 ENCOUNTER — Other Ambulatory Visit: Payer: Self-pay | Admitting: *Deleted

## 2018-08-09 ENCOUNTER — Ambulatory Visit (INDEPENDENT_AMBULATORY_CARE_PROVIDER_SITE_OTHER): Payer: No Typology Code available for payment source | Admitting: Orthopaedic Surgery

## 2018-08-12 ENCOUNTER — Encounter: Payer: Medicare Other | Admitting: Physical Therapy

## 2018-08-20 ENCOUNTER — Telehealth: Payer: Self-pay | Admitting: Neurology

## 2018-08-20 ENCOUNTER — Other Ambulatory Visit: Payer: Self-pay | Admitting: Neurology

## 2018-08-20 ENCOUNTER — Encounter: Payer: Self-pay | Admitting: *Deleted

## 2018-08-20 NOTE — Telephone Encounter (Signed)
Pt states she needs documentation and a letter of her disability to excuse her from [employer]. Please advise.

## 2018-08-20 NOTE — Telephone Encounter (Signed)
Spoke with [employer].  Letter requesting she be excused from jury duty awaiting [name] sig, then will put up front [employer] for her to pick up./fim

## 2018-08-23 ENCOUNTER — Encounter: Payer: Self-pay | Admitting: Physical Therapy

## 2018-08-23 ENCOUNTER — Ambulatory Visit: Payer: Medicare Other | Attending: Orthopaedic Surgery | Admitting: Physical Therapy

## 2018-08-23 DIAGNOSIS — R262 Difficulty in walking, not elsewhere classified: Secondary | ICD-10-CM

## 2018-08-23 DIAGNOSIS — G8929 Other chronic pain: Secondary | ICD-10-CM | POA: Diagnosis not present

## 2018-08-23 DIAGNOSIS — M6281 Muscle weakness (generalized): Secondary | ICD-10-CM | POA: Diagnosis not present

## 2018-08-23 DIAGNOSIS — R252 Cramp and spasm: Secondary | ICD-10-CM | POA: Diagnosis not present

## 2018-08-23 DIAGNOSIS — M545 Low back pain: Secondary | ICD-10-CM | POA: Insufficient documentation

## 2018-08-24 ENCOUNTER — Other Ambulatory Visit: Payer: Self-pay | Admitting: Neurology

## 2018-08-24 ENCOUNTER — Encounter: Payer: Self-pay | Admitting: Physical Therapy

## 2018-08-24 NOTE — Therapy (Signed)
[place], [place], [address] Phone: [phone]   Fax:  [phone]  Physical Therapy Treatment/Discharge   Patient Details  Name: Dawn [name] MRN: [id number] Date of Birth: Mar 05, 1946 Referring Provider (PT): Dr Joni Fears   Progress Note Reporting Period 07/14/2019 to 08/24/2018  See note below for Objective Data and Assessment of Progress/Goals.      Encounter Date: 08/23/2018  PT End of Session - 08/23/18 1337    Visit Number  23    Number of Visits  26    Date for PT Re-Evaluation  09/01/18    Authorization Type  Medicare     PT Start Time  1330    PT Stop Time  1413    PT Time Calculation (min)  43 min    Activity Tolerance  Patient tolerated treatment well    Behavior During Therapy  WFL for tasks assessed/performed       Past Medical History:  Diagnosis Date  . Allergy   . Hot flashes, menopausal 10/13/2011   Estradiol started   . Multiple sclerosis ([place])   . Neuropathy   . Osteoarthritis   . Vision abnormalities     Past Surgical History:  Procedure Laterality Date  . ABDOMINAL HYSTERECTOMY  2002   partial  . LUMBAR FUSION      There were no vitals filed for this visit.   Subjective Assessment - 08/23/18 1334    Subjective  Patient reports the flair-up has passed. She toke some prednisone which helped.     Limitations  Standing;Walking;House hold activities    How long can you stand comfortably?  varys on the time of day and activity     How long can you walk comfortably?  Can walk through the grocery store with a cart     Diagnostic tests  MRI: Multi level degeneration and     Currently in Pain?  Yes    Pain Score  3     Pain Location  Hip    Pain Orientation  Right    Pain Descriptors / Indicators  Aching    Pain Type  Chronic pain    Pain Onset  More than a month ago    Pain Frequency  Constant    Aggravating Factors   housework     Pain Relieving Factors   stretches     Effect of Pain on Daily Activities  still having pain          [facility] PT Assessment - 08/24/18 0001      AROM   Lumbar Flexion  65    Lumbar Extension  20      Strength   Right Hip Flexion  5/5    Right Hip ABduction  5/5    Right Hip ADduction  5/5    Left Hip Flexion  4+/5    Left Hip ABduction  4+/5    Left Hip ADduction  4+/5      Right Hip   Right Hip Flexion  110      Palpation   Palpation comment  mild ttp of bilateral paraspinals                  Objective measurements completed on examination: See above findings.              PT Education - 08/23/18 1337    Education Details  reviewed HEP; symptom mangement     Person(s) Educated  Patient    Methods  Explanation;Demonstration;Tactile cues;Verbal cues    Comprehension  Returned demonstration;Verbal cues required;Tactile cues required;Verbalized understanding       PT Short Term Goals - 08/24/18 3903      PT SHORT TERM GOAL #1   Title  Patient will increase right passive hip flexion to 105 degrees without pain     Baseline  105    Time  4    Period  Weeks    Status  Achieved      PT SHORT TERM GOAL #2   Title  Patient will increase gross bilateral lower extremity strength to 4+/5     Baseline  4+/5 hip flexion 4+/5 hip abduction     Time  4    Period  Weeks    Status  Achieved      PT SHORT TERM GOAL #3   Title  Patient will be independent with basic HEP     Baseline  independnet with exercises given to her at this time     Time  4    Period  Weeks    Status  Achieved        PT Long Term Goals - 08/24/18 0092      PT LONG TERM GOAL #1   Title  Patient will stand for 1 hour without self reported increase pain in order to stand at a kitchen sink.    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 She has a complete exercise program. Therapy spent this session going through all the exercises she has been given. Everything was paired down into different exercises for her different body parts and for posture and decompression. She feels comfortable with all her exercises. She has reached max benefit from therapy. She was given community resourses for [employer] and needling if she needs a visit or two to keep herself at a good space.     Clinical Presentation  Evolving    Clinical Decision Making  Moderate    Rehab Potential  Good    PT Frequency  1x / week    PT Duration  4 weeks    PT Treatment/Interventions  ADLs/Self Care Home Management;Cryotherapy;Electrical Stimulation;Ultrasound;Moist Heat;Iontophoresis 27m/ml Dexamethasone;Stair training;Functional mobility training;Therapeutic activities;Therapeutic exercise;Manual techniques;Passive range of motion;Dry needling;Taping    PT Next Visit Plan  continue to review exercise. Progress standing balance exercises - air ex, reactive/ anticipitory activities ; progress to swiss ball as able ; progress back into UE and postural exercises as  bicep pain subsides    PT Home Exercise Plan  supine core breathing with hip marching; standing balance exercises at sink ,  calf stretch with rope.    Consulted and Agree with Plan of Care  Patient       Patient will benefit from skilled therapeutic intervention in order to improve the following deficits and impairments:  Pain, Decreased endurance, Decreased range of motion, Decreased  strength, Decreased activity tolerance, Increased muscle spasms  Visit Diagnosis: Chronic bilateral low back pain without sciatica  Muscle weakness (generalized)  Cramp and spasm  Difficulty in walking, not elsewhere classified     Problem List Patient Active Problem List   Diagnosis Date Noted  . Abnormal SPEP 02/19/2018  . Hand pain 02/20/2017  . Multiple joint pain 02/18/2017  . Right elbow pain 12/09/2016  . Disturbed cognition 06/25/2016  . Trochanteric bursitis of left hip 02/18/2016  . Sciatica, right side 10/30/2015  . Bilateral arm pain 10/10/2015  . Trochanteric bursitis of right hip 08/04/2014  . Adjustment disorder with mixed anxiety and depressed mood 08/04/2014  . Chronic fatigue 08/04/2014  . Urinary frequency 08/04/2014  . Abnormality of gait 08/04/2014  . Unspecified visual disturbance 01/31/2013  . Routine general medical examination at a health care facility 11/04/2011  . Colon polyps 11/03/2011  . Hot flashes, menopausal 10/13/2011  . POSTHERPETIC NEURALGIA 10/03/2009  . DISPLCMT LUMBAR INTERVERT [place] W/O MYELOPATHY 09/05/2009  . RENAL CALCULUS, RECURRENT 09/04/2009  . HYPERLIPIDEMIA 08/31/2009  . MULTIPLE SCLEROSIS, PROGRESSIVE/RELAPSING 08/31/2009  . ALLERGIC RHINITIS 08/31/2009   PHYSICAL THERAPY DISCHARGE SUMMARY  Visits from Start of Care: 23  Current functional level related to goals / functional outcomes: Significant improvement infunctional mobility   Remaining deficits: Continued baseline pain    Education / Equipment: HEP   Plan: Patient agrees to discharge.  Patient goals were met. Patient is being discharged due to meeting the stated rehab goals.  ?????      Carney Living PT DPT  08/24/2018, 9:22 AM  [facility] [address] [place], [place], [address] Phone: [phone]   Fax:  [phone]  Name: BRITLYN MARTINE MRN: [id number] Date of Birth: Feb 01, 1946

## 2018-08-25 ENCOUNTER — Other Ambulatory Visit: Payer: Self-pay

## 2018-08-25 ENCOUNTER — Ambulatory Visit (INDEPENDENT_AMBULATORY_CARE_PROVIDER_SITE_OTHER): Payer: Medicare Other | Admitting: Neurology

## 2018-08-25 ENCOUNTER — Encounter: Payer: Self-pay | Admitting: Neurology

## 2018-08-25 VITALS — BP 175/90 | HR 74 | Ht 62.0 in | Wt 188.0 lb

## 2018-08-25 DIAGNOSIS — R269 Unspecified abnormalities of gait and mobility: Secondary | ICD-10-CM | POA: Diagnosis not present

## 2018-08-25 DIAGNOSIS — R5382 Chronic fatigue, unspecified: Secondary | ICD-10-CM | POA: Diagnosis not present

## 2018-08-25 DIAGNOSIS — E559 Vitamin D deficiency, unspecified: Secondary | ICD-10-CM | POA: Diagnosis not present

## 2018-08-25 DIAGNOSIS — R4189 Other symptoms and signs involving cognitive functions and awareness: Secondary | ICD-10-CM | POA: Diagnosis not present

## 2018-08-25 DIAGNOSIS — R350 Frequency of micturition: Secondary | ICD-10-CM

## 2018-08-25 DIAGNOSIS — I10 Essential (primary) hypertension: Secondary | ICD-10-CM

## 2018-08-25 DIAGNOSIS — G35 Multiple sclerosis: Secondary | ICD-10-CM

## 2018-08-25 HISTORY — DX: Essential (primary) hypertension: I10

## 2018-08-25 MED ORDER — ACETAMINOPHEN-CODEINE #3 300-30 MG PO TABS: ORAL_TABLET | ORAL | 3 refills | Status: DC

## 2018-08-25 MED ORDER — LOSARTAN POTASSIUM 25 MG PO TABS: 25.0000 mg | ORAL_TABLET | Freq: Every day | ORAL | 5 refills | Status: DC

## 2018-08-25 MED ORDER — MODAFINIL 200 MG PO TABS: ORAL_TABLET | ORAL | 5 refills | Status: DC

## 2018-08-25 NOTE — Addendum Note (Signed)
Addended by: Hope Pigeon on: 08/25/2018 07:12 AM   Modules accepted: Orders

## 2018-08-25 NOTE — Progress Notes (Signed)
[facility]  PATIENT: Dawn [name] DOB: 1946/02/24  REFERRING CLINICIAN: Aura Dials HISTORY FROM: Patient REASON FOR VISIT: MS   HISTORICAL  CHIEF COMPLAINT:  Chief Complaint  Patient presents with  . Follow-up    RM 13, alone. Last seen 02/19/18. Having tremors in both hands now. This started last year. Having numbness in both feet (chronic issue), back of her left leg. Sometimes has worsening numbness in left hand. Not taking gabapentin, this was ineffective.  . Multiple Sclerosis    On Leflunomide.  . Extremity Weakness    Having generalized body weakness. A few weeks ago she had steroid pack from (ortho MD) for fatigue/pain.   . Recurrent UTI    Pt reports she had a couple UTI's recently. Never got tested. She is wanting to have urine screen today. She is requesting refill on cipro. She has been taking Azo OTC to try and help with sx.  . Memory Loss    Pt reports worsening memory problems.     HISTORY OF PRESENT ILLNESS:  Dawn [name] is a 73 y.o. woman who was diagnosed with multiple sclerosis in 2001.     Update 08/25/2018: She reports that her MS is a little worse and she notes some new symptoms.  She has not noted any exacerbation.  She is on leflunomide and tolerates it well.   She feels gait is mildly worse.   She is doing PT and does dry needling and stretches.  Pain was worse 3 weeks ago.   A prednisone pack helped.  She is worried about attention and focus are worse. Memory seems worse.    She has mor trouble with coming up with the right words.    She feels her BP is high and sometimes the heart is racing.   She felt these symptoms started with starting Aubagio and continued with switch to Leflunomide.    BP has been elevated at times and is 175/90 today.      MGUS was considered based on a probable IgG lambda but this was not felt to be relevant.   Follow-up labwork showed a normal IEF   Update 02/19/2018: Her MS is doing well.   She  is on Leflunomide and she tolerates it well.  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    She feels she has slowly progressed over the last couple years with gait.    Her memory is also worse.    She tries to keep herself mentally active.    She notes reduced STM and reduced focus/attentipon.   She left the stove on once.    Usually, when she forgets, she will remember later or remember with a hint.       She is doing PT for her lower back pain and feels it is doing well.    She is doing dry needling with benefit.    She is on etodolac but is no longer on gabapentin and tramadol.     The bursitis pain has returned.    She gets ome benefit from the trochanteric bursa injections she had in the past.      She continues to have some generalized joint pain though the hips are much more painful than elsewhere.  Due to her back and hip pain she saw orthopedics.  An MRI of the lumbar spine was performed showing a focus in the L1 vertebral body.  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   She was referred to hematology but the appointment was made in [place] and she was unable to go there and she has not followed up.       Update 10/20/2017: She is reporting more pain in the hips, left > right.  Pain is worse when she is laying on her sides or if she stands or walks a while.  She received benefit from trochanteric bursae injections in the past.   We discussed her seeing Ortho.     The right elbow pain is much batter after a shot in the past.    Her MS is doing well.  She is on leflunomide (she had issues getting patient assistance for Aubagio).  She is tolerating it well.  She has not had any exacerbations since starting.  She feels her MS is stable.  She notes no new issues with her gait, strength or sensation.  Balance is still off at times.  She has some urinary frequency and urgency but no incontinence.   She stopped Myrbetriq and went back to  Anna because she had a mild rash.    Update 08/12/2017: She saw Rheumatology [facility] [place]) and they feel that she does not have RA or Sjogren's.    She switched form Aubagio to Leflunomide due to insurance issues and very high copay.    She notes that she feels better overall and copay is only $8.   However, she notes that her mouth is dryer and throat is scratchy much of the time.   She also notes mild dry eyes.   She has not seen ophthalmology in several years.      She denies new issues with gait, strength or sensation.    No change in bladder with frequency and nocturia (twice mostly).    She notes less worry and stress since Aubagio was switched to Leflunomide as she was going to have very high copays.     She feels better with less dysesthesia since switching to leflunomide   She has bilateral hip pain again.  Pain worsens with lying on her side or standing a while. In the past, trochanteric bursae injections were beneficial  Update 04/14/2017: She is on Aubagio but is just about out.   [employer] is not able to help her get Aubagio without a very high copay.  She is needing to call the "charity" daily to see if new funds are available.   We discussed trying Leflunomide which is a precursor of teriflunomide and used for rheumatoid arthritis.    I have used this for other patients with the same problems getting medication.     For the most part, her MS is stable but she is noting more burning dysesthesia in her feet.   Aspercreme helps Tramadol was not well tolerated.   Etodolac helps the arthritic pain some.   Her elbow pain improved with an injection.    A piriformis injection only helped a week.   She has myalgias throughout her body.     She has had dark urine a few times but it only lasted one day.    Bladder function is unchanged and Vesicare helps the frequency and urgency.  .   Vision sometimes seems blurry but this fluctuates.     Fatigue bothers her daily.   Provigil helps some  but incompletely.  Mood has been worse since she found out she wasn't going to be getting copay  assistance for Aubagio ________________________________________________ From 02/18/2017:  Pain:   She is experiencing bilateral hip pain and more pain in her hands and feet.     We have done trochanteric bursae injections and bilateral piriformis injections seemed to help the pain for a longer time and pelvic pain is also better.  She has  A labrum tear on the right and has seen ortho.     She also has elbow pain on the right, helped x 2 months by steroid injection at the last visit.   Tramadol and etodolac helps some but not long term.   All the joint pain worsens with use and improves with a heating pad.   She has not been tested for inflammatory arthritis.   Other joints also hurt much of the time.  MS:   She is on Aubagio and she tolerates it well. She has occasional diarrhea.   She could not tolerate Tecfidera and had tolerability issues with Plegridy and Betaseron earlier.   She is not having any definite exacerbation though she has some sensory fluctuations.   The MRI performed January 2018 showed chronic MS lesions but no acute findings.   She also appears to have had a left cerebellar CVA (also chronic)  Vision:   She notes mild blurred vision that is intermittent.    She feels both eyes are involved.   Once occurred while driving and once while watching TV.    She did not feel symptoms were just from one eye.     Gait/strength/sensation:     She uses a cane for longer distances and if tired but walks without most of the time.  She uses it more when hips act up also.       She notes more muscle fatigue.    She has dysesthesias mostly in the left leg that did not respond well to the medications.     She denies much weakness but has muscle stiffness and pain.   Bladder:   She has urinary frequency and hesitancy.  She is on tamsulosin qod.  Vesicare helped but seemed to help her more last year that this  year.    She has seen [facility].  She has a little diarrhea at times.    Fatigue/sleep:   She has fatigue that is worse in the afternoons.    She is back on modafinil with benefit. She has some difficulty with sleep onset insomnia > sleep maintenance, mostly due to pain she thinks.   Vesicare has helped sleep with less nocturia.    She has not been told she snores.    Mood/cognition:    Mood is generslly gfood, occ down with her pain.   She gets frustrated easily.  She is now doing an exercise class out in the community (does sitting down --- it a an arthritis group)..   Cognitively, she has difficulty with short-term memory as well as with verbal fluency  MS History:    In 1994, she had an episode of severe vertigo with gait ataxia. An MRI at that time was reportedly normal. The symptoms were felt to be due to allergies. About 7 years later she had vertigo, gait ataxia and diplopia. Also her left leg was giving out. Short after that she had a hysterectomy. Postoperatively, she was numb in both legs and she had a lumbar puncture which showed changes consistent with MS. Then, an MRI of the brain was performed showing changes consistent with MS. She was referred  to Dr. Erling Cruz who her on Betaseron. Last year, she switched from Betaseron to Tecfidera, in the hope that she would be able to avoid shots as she was having severe skin reactions with knots.    However, she had a lot of difficulty tolerating Tecfidera and swithced to [place] in early 2016 and to Aubagio late 2016    REVIEW OF SYSTEMS:  Constitutional: No fevers, chills, sweats, or change in appetite.   Reports fatigue Eyes: No visual changes, double vision, eye pain Ear, nose and throat: No hearing loss, ear pain, nasal congestion, sore throat Cardiovascular: No chest pain, palpitations Respiratory:  No shortness of breath at rest or with exertion.   No wheezes GastrointestinaI: No nausea, vomiting, diarrhea, abdominal pain, fecal  incontinence Genitourinary:  see above. Musculoskeletal:  Pain in left greater than right hip Integumentary: No rash, pruritus.    Neurological: as above Psychiatric: No depression at this time.  No anxiety Endocrine: No palpitations, diaphoresis, change in appetite, change in weigh or increased thirst Hematologic/Lymphatic:  No anemia, purpura, petechiae. Allergic/Immunologic: No itchy/runny eyes, nasal congestion, recent allergic reactions, rashes  ALLERGIES: No Known Allergies  HOME MEDICATIONS: Outpatient Medications Prior to Visit  Medication Sig Dispense Refill  . aspirin 81 MG tablet Take 81 mg by mouth daily.    . calcium carbonate (OS-CAL) 600 MG TABS Take 600 mg by mouth daily.    . cholecalciferol (VITAMIN D) 1000 UNITS tablet Take 1,000 Units by mouth daily.     . clonazePAM (KLONOPIN) 0.5 MG tablet Take 1 tablet (0.5 mg total) by mouth at bedtime. 30 tablet 5  . estradiol (CLIMARA - DOSED IN MG/24 HR) 0.0375 mg/24hr patch 1 patch.  11  . fexofenadine (ALLEGRA) 180 MG tablet Take 180 mg by mouth daily.    [name] Kitchen leflunomide (ARAVA) 20 MG tablet Take 1 tablet (20 mg total) by mouth daily. 30 tablet 11  . methylPREDNISolone (MEDROL DOSEPAK) 4 MG TBPK tablet Take as directed 21 tablet 0  . modafinil (PROVIGIL) 200 MG tablet TAKE 1 TABLET EVERY MORNING AND ONE-HALF TABLET EVERY AFTERNOON 45 tablet 5  . Multiple Vitamins-Minerals (MULTIVITAMIN WITH MINERALS) tablet Take 1 tablet by mouth daily.    [name] Kitchen omeprazole (PRILOSEC) 40 MG capsule TAKE 1 CAPSULE DAILY BY MOUTH. 90 capsule 3  . Polyethylene Glycol 3350 (MIRALAX PO) Take by mouth.    . progesterone (PROMETRIUM) 100 MG capsule TAKE 1 CAPSULE BY MOUTH EVERY DAY AT BEDTIME  3  . traMADol (ULTRAM) 50 MG tablet Take 1 tablet (50 mg total) by mouth 3 (three) times daily as needed. 90 tablet 2  . VESICARE 5 MG tablet TAKE 1 TABLET (5 MG TOTAL) BY MOUTH DAILY. 30 tablet 11  . etodolac (LODINE) 400 MG tablet Take 1 tablet (400 mg total) by  mouth 2 (two) times daily. 60 tablet 9  . gabapentin (NEURONTIN) 300 MG capsule Take 1 capsule (300 mg total) by mouth 3 (three) times daily. 90 capsule 11   No facility-administered medications prior to visit.     PAST MEDICAL HISTORY: Past Medical History:  Diagnosis Date  . Allergy   . Hot flashes, menopausal 10/13/2011   Estradiol started   . Multiple sclerosis ([place])   . Neuropathy   . Osteoarthritis   . Vision abnormalities     PAST SURGICAL HISTORY: Past Surgical History:  Procedure Laterality Date  . ABDOMINAL HYSTERECTOMY  2002   partial  . LUMBAR FUSION      FAMILY HISTORY: Family History  Problem Relation Age of Onset  . Heart failure Mother   . Heart failure Father   . Arthritis Other   . Hypertension Other     SOCIAL HISTORY:  Social History   Socioeconomic History  . Marital status: Widowed    Spouse name: Not on file  . Number of children: Not on file  . Years of education: Not on file  . Highest education level: Not on file  Occupational History  . Occupation: retired  Scientific laboratory technician  . Financial resource strain: Not on file  . Food insecurity:    Worry: Not on file    Inability: Not on file  . Transportation needs:    Medical: Not on file    Non-medical: Not on file  Tobacco Use  . Smoking status: Former Smoker    Packs/day: 0.50    Years: 15.00    Pack years: 7.50    Types: Cigarettes    Last attempt to quit: 08/05/1983    Years since quitting: 35.0  . Smokeless tobacco: Never Used  Substance and Sexual Activity  . Alcohol use: Yes    Alcohol/week: 0.0 standard drinks    Comment: rarely  . Drug use: No  . Sexual activity: Not Currently    Birth control/protection: Post-menopausal  Lifestyle  . Physical activity:    Days per week: Not on file    Minutes per session: Not on file  . Stress: Not on file  Relationships  . Social connections:    Talks on phone: Not on file    Gets together: Not on file    Attends religious  service: Not on file    Active member of club or organization: Not on file    Attends meetings of clubs or organizations: Not on file    Relationship status: Not on file  . Intimate partner violence:    Fear of current or ex partner: Not on file    Emotionally abused: Not on file    Physically abused: Not on file    Forced sexual activity: Not on file  Other Topics Concern  . Not on file  Social History Narrative   Regular Exercise-no   Widowed   Retired   Left handed      PHYSICAL EXAM  Vitals:   08/25/18 1133  BP: (!) 175/90  Pulse: 74  SpO2: 96%  Weight: 188 lb (85.3 kg)  Height: 5\' 2"  (1.575 m)    Body mass index is 34.39 kg/m.   General: The patient is well-developed and well-nourished and in no acute distress  Musculoskeletal: She has mild trochanteric bursa tenderness, bilaterally.  Neurologic Exam  Mental status: The patient is alert and oriented x 3 at the time of the examination. The patient has apparent normal recent and remote memory, with an apparently normal attention span and concentration ability.   Speech is normal.  Cranial nerves: Extraocular movements are full.  Facial strength and sensation are normal.  The trapezius strength is normal.  Hearing appeared to be symmetric  Motor:  Muscle bulk is normal but tone is symmetric, more on the left.. Strength is 4+/5 in intrinsic hand muscles   Sensory: Sensory testing is intact to touch and vibration in the arms.  She has reduced vibration sensation in the feet.  Coordination: Finger-nose-finger and heel-to-shin is performed better on the right than the left.  Gait and station: Station is normal.  Her gait is mildly wide and arthritic.  The tandem gait is wide.  Romberg is negative.    Reflexes: Deep tendon reflexes are symmetric and normal bilaterally.      DIAGNOSTIC DATA (LABS, IMAGING, TESTING) - I reviewed patient records, labs, notes, testing and imaging myself where available.  Lab  Results  Component Value Date   WBC 4.9 03/09/2018   HGB 13.6 03/09/2018   HCT 41.3 03/09/2018   MCV 89.5 03/09/2018   PLT 171 03/09/2018      Component Value Date/Time   NA 140 03/09/2018 1342   NA 144 08/12/2017 0917   K 4.1 03/09/2018 1342   CL 107 03/09/2018 1342   CO2 26 03/09/2018 1342   GLUCOSE 88 03/09/2018 1342   BUN 20 03/09/2018 1342   BUN 14 08/12/2017 0917   CREATININE 0.75 03/09/2018 1342   CALCIUM 9.2 03/09/2018 1342   PROT 6.5 03/09/2018 1342   PROT 6.5 08/12/2017 0917   ALBUMIN 3.8 03/09/2018 1342   ALBUMIN 4.6 08/12/2017 0917   AST 20 03/09/2018 1342   ALT 22 03/09/2018 1342   ALKPHOS 119 03/09/2018 1342   BILITOT 0.4 03/09/2018 1342   GFRNONAA >60 03/09/2018 1342   GFRAA >60 03/09/2018 1342   Lab Results  Component Value Date   CHOL 203 (H) 11/03/2011   HDL 44.60 11/03/2011   LDLCALC 136 (H) 12/05/2009   LDLDIRECT 130.2 11/03/2011   TRIG 185.0 (H) 11/03/2011   CHOLHDL 5 11/03/2011   No results found for: HGBA1C No results found for: VITAMINB12 Lab Results  Component Value Date   TSH 3.80 11/03/2011       ASSESSMENT AND PLAN  MULTIPLE SCLEROSIS, PROGRESSIVE/RELAPSING - Plan: CBC with Differential/Platelet, Comprehensive metabolic panel, VITAMIN D 25 Hydroxy (Vit-D Deficiency, Fractures), Thyroid Panel With TSH, MR BRAIN W WO CONTRAST  Urinary frequency - Plan: Urinalysis, Routine w reflex microscopic, Culture, Urine  Abnormality of gait  Disturbed cognition - Plan: CBC with Differential/Platelet, Comprehensive metabolic panel, Thyroid Panel With TSH  Chronic fatigue - Plan: CBC with Differential/Platelet, Comprehensive metabolic panel, Thyroid Panel With TSH  Vitamin D deficiency - Plan: VITAMIN D 25 Hydroxy (Vit-D Deficiency, Fractures)   1.    She will continue leflunomide for MS.   Check labs.   2.     MRI brain c/s for MS and memory loss.   3.    Stay active and exercise as tolerated.   5.      Return to see me in about 6  months but is advised to call sooner if she has new or worsening neurologic symptoms.     A. Felecia Shelling, MD, PhD 12/27/4501, 8:88 PM Certified in Neurology, Clinical Neurophysiology, Sleep Medicine, Pain Medicine and Neuroimaging  [facility] [address] [place], [place] [address] [phone]-' \

## 2018-08-26 ENCOUNTER — Encounter: Payer: Self-pay | Admitting: Internal Medicine

## 2018-08-26 ENCOUNTER — Telehealth: Payer: Self-pay | Admitting: Neurology

## 2018-08-26 LAB — COMPREHENSIVE METABOLIC PANEL
ALT: 25 IU/L (ref 0–32)
AST: 22 IU/L (ref 0–40)
Albumin/Globulin Ratio: 2.3 — ABNORMAL HIGH (ref 1.2–2.2)
Albumin: 4.3 g/dL (ref 3.8–4.8)
Alkaline Phosphatase: 126 IU/L — ABNORMAL HIGH (ref 39–117)
BUN / CREAT RATIO: 27 (ref 12–28)
BUN: 19 mg/dL (ref 8–27)
Bilirubin Total: 0.3 mg/dL (ref 0.0–1.2)
CALCIUM: 9.4 mg/dL (ref 8.7–10.3)
CO2: 20 mmol/L (ref 20–29)
Chloride: 102 mmol/L (ref 96–106)
Creatinine, Ser: 0.71 mg/dL (ref 0.57–1.00)
GFR calc Af Amer: 100 mL/min/{1.73_m2} (ref >59)
GFR, EST NON AFRICAN AMERICAN: 87 mL/min/{1.73_m2} (ref >59)
Globulin, Total: 1.9 g/dL (ref 1.5–4.5)
Glucose: 84 mg/dL (ref 65–99)
Potassium: 4.4 mmol/L (ref 3.5–5.2)
Sodium: 140 mmol/L (ref 134–144)
Total Protein: 6.2 g/dL (ref 6.0–8.5)

## 2018-08-26 LAB — CBC WITH DIFFERENTIAL/PLATELET
BASOS: 1 %
Basophils Absolute: 0.1 10*3/uL (ref 0.0–0.2)
EOS (ABSOLUTE): 0.1 10*3/uL (ref 0.0–0.4)
Eos: 3 %
HEMOGLOBIN: 13.9 g/dL (ref 11.1–15.9)
Hematocrit: 41.4 % (ref 34.0–46.6)
Immature Grans (Abs): 0 10*3/uL (ref 0.0–0.1)
Immature Granulocytes: 1 %
Lymphocytes Absolute: 0.7 10*3/uL (ref 0.7–3.1)
Lymphs: 17 %
MCH: 29.8 pg (ref 26.6–33.0)
MCHC: 33.6 g/dL (ref 31.5–35.7)
MCV: 89 fL (ref 79–97)
Monocytes Absolute: 0.4 10*3/uL (ref 0.1–0.9)
Monocytes: 9 %
Neutrophils Absolute: 2.8 10*3/uL (ref 1.4–7.0)
Neutrophils: 69 %
Platelets: 216 10*3/uL (ref 150–450)
RBC: 4.66 x10E6/uL (ref 3.77–5.28)
RDW: 13.6 % (ref 11.7–15.4)
WBC: 4.1 10*3/uL (ref 3.4–10.8)

## 2018-08-26 LAB — URINALYSIS, ROUTINE W REFLEX MICROSCOPIC
Bilirubin, UA: NEGATIVE
Glucose, UA: NEGATIVE
Ketones, UA: NEGATIVE
Leukocytes, UA: NEGATIVE
Nitrite, UA: NEGATIVE
Protein, UA: NEGATIVE
RBC, UA: NEGATIVE
Specific Gravity, UA: 1.01 (ref 1.005–1.030)
Urobilinogen, Ur: 0.2 mg/dL (ref 0.2–1.0)
pH, UA: 6 (ref 5.0–7.5)

## 2018-08-26 LAB — VITAMIN D 25 HYDROXY (VIT D DEFICIENCY, FRACTURES): Vit D, 25-Hydroxy: 31.7 ng/mL (ref 30.0–100.0)

## 2018-08-26 LAB — THYROID PANEL WITH TSH
Free Thyroxine Index: 1.3 (ref 1.2–4.9)
T3 UPTAKE RATIO: 20 % — AB (ref 24–39)
T4, Total: 6.4 ug/dL (ref 4.5–12.0)
TSH: 3.72 u[IU]/mL (ref 0.450–4.500)

## 2018-08-26 NOTE — Telephone Encounter (Signed)
[place]icare/[place] of [place] order sent to GI. They will reach out to the pt to schedule.

## 2018-08-27 LAB — URINE CULTURE: Organism ID, Bacteria: NO GROWTH

## 2018-08-30 ENCOUNTER — Encounter: Payer: Self-pay | Admitting: Internal Medicine

## 2018-08-30 ENCOUNTER — Ambulatory Visit (INDEPENDENT_AMBULATORY_CARE_PROVIDER_SITE_OTHER): Payer: Medicare Other | Admitting: Internal Medicine

## 2018-08-30 ENCOUNTER — Telehealth: Payer: Self-pay | Admitting: *Deleted

## 2018-08-30 VITALS — BP 144/88 | HR 74 | Temp 98.0°F | Resp 10 | Ht 62.0 in | Wt 188.0 lb

## 2018-08-30 DIAGNOSIS — E6609 Other obesity due to excess calories: Secondary | ICD-10-CM | POA: Diagnosis not present

## 2018-08-30 DIAGNOSIS — Z1211 Encounter for screening for malignant neoplasm of colon: Secondary | ICD-10-CM

## 2018-08-30 DIAGNOSIS — Z23 Encounter for immunization: Secondary | ICD-10-CM | POA: Diagnosis not present

## 2018-08-30 DIAGNOSIS — G35 Multiple sclerosis: Secondary | ICD-10-CM | POA: Diagnosis not present

## 2018-08-30 DIAGNOSIS — R5382 Chronic fatigue, unspecified: Secondary | ICD-10-CM | POA: Diagnosis not present

## 2018-08-30 DIAGNOSIS — I10 Essential (primary) hypertension: Secondary | ICD-10-CM | POA: Diagnosis not present

## 2018-08-30 DIAGNOSIS — Z6834 Body mass index (BMI) 34.0-34.9, adult: Secondary | ICD-10-CM

## 2018-08-30 MED ORDER — ZOSTER VAC RECOMB ADJUVANTED 50 MCG/0.5ML IM SUSR: 0.5000 mL | Freq: Once | INTRAMUSCULAR | 1 refills | Status: AC

## 2018-08-30 MED ORDER — LOSARTAN POTASSIUM 25 MG PO TABS: 25.0000 mg | ORAL_TABLET | Freq: Every day | ORAL | 3 refills | Status: DC

## 2018-08-30 NOTE — Telephone Encounter (Signed)
Called and spoke with pt. Relayed results per Dr. Felecia Shelling note. She verbalized understanding. She will start taking VIT D PO 5000U qd. I added to med list.

## 2018-08-30 NOTE — Progress Notes (Signed)
Provider:  Rexene Edison. Mariea Clonts, D.O., C.M.D. Location:   [place]   Place of Service:   [place] [facility]  Previous PCP: Gayland Curry, DO Patient Care Team: Gayland Curry, DO as PCP - General (Geriatric Medicine) [name], MD as Consulting Physician (Orthopedic Surgery) [name], [name], MD (Neurology)  Extended Emergency Contact Information Primary Emergency Contact: Kathlen Mody Mobile Phone: [phone] Relation: Friend Secondary Emergency Contact: Crissie Reese Mobile Phone: [phone] Relation: Friend  Goals of Care: Advanced Directive information Advanced Directives 08/30/2018  Does Patient Have a Medical Advance Directive? Yes  Type of Advance Directive Living will  Does patient want to make changes to medical advance directive? No - Patient declined  Copy of [place] in Chart? -  Has very good friends but would not want to put health care power of attorney on them.  Does have a living will--says she brought it, but it's not scanned as of yet.  Chief Complaint  Patient presents with  . Establish Care    New patient establish care, discuss blood pressure. Patient would like a clarification on what symptoms are related to MS vs Aging     HPI: Patient is a 73 y.o. [name] seen today to establish with [facility].  Records have been requested from Dr. Sheryn Bison.  She is doing fine.    MS:  She saw Dr Sheryn Bison for 20 some years.  He has semi-retired and moved his practice to [facility] while she lives in [place].    She was diagnosed finally with MS in 2001.  Had symptoms for a few years (7) before.  It's the progressive variety.  She has a lot of musculoskeletal.  Both feet and left leg have numb areas.  She has attention and memory difficulties.  Has periodic eye things going on.  Has episodic vertigo that comes and goes (worst thing).  Dr. Felecia Shelling, her [place] specialist, had her on a different med--ibagio (sp?) which was highly costly.  He put her on  leflunomide which is much cheaper and has similar effects--been on it about 2 years.     Has bursitis in her hips.  Sees Dr. Durward Fortes.  Has been doing PT with dry needling, stretching, but maxed out.    Has 4 herniated discs in her back. Had 2001 herniated disc surgery--then had major exacerbation, got the spinal tap and diagnosis.  Avoiding surgeries.   Etodolac does not help a lot prn.  The tylenol with codeine is to help that and was newly prescribed.  Discussed potential constipation worsening with that.  She did try tramadol before--it didn't stop the pain, just made her not care and it made her out of it.  Did not like it.  Just plain tylenol helps at high doses.    Klonopin helps her get to sleep for her legs.  Pain begins in legs around 3-4pm.  She's going to try tylenol with codeine for that also.  She does not like taking opioids.  Does not want to be sedated.  Has bladder problems--on vesicare to help with waking at night.    Bowels have also been a long time--has to work on constipation with fiber and miralax.  Has not tolerated myrbetriq in the past.    Has sleep problems.  Have also been long-term but growing worse.  Does the hormones b/c the hot flashes never stopped and each time she tried to come off the hormones, she's had them come right back.  We discussed how it's essential to wean these very gradually but her hot flashes are typical during the process.  Had partial hysterectomy--has ovaries.  Her gyn does not like her being on the hormones either.    BP:  Every doctor visit has shown high bp this year.  Dr. Felecia Shelling suggested she start taking her blood pressure--has wrist cuff.  Says he started her on losartan '25mg'$  daily.  She's going to keep a journal.  She also has gained 10 lbs.  She's going to try to do better with hydration and aerobic exercise.  Does not eat a lot of salt anyway.  I agreed with this approach.  Would like to know which symptoms are from MS vs. Aging--we  talked about this.  She is faithful with her exercises daily.  They really help.   She is in two MS support groups.  Does tai chi. Has also been meditating for years also.  Has had MS flares that required steroids. Last was early Jan--got prednisone for that--thinks she is back to her baseline physically.  Has MRI in 2 weeks with Dr. Felecia Shelling.    Had bone density that "was good."  Is on estrogen/progesterone for severe hot flashes and 5000 units of vitamin D instead of 1000units was just begun.    Had a cscope but does not recall when--does not want another one.  Had adenomatous polyp 11 yrs ago.  Cscope did not go well b/c of her severe constipation.  Also did not tolerate surgery well and had an MS flare afterward.  She is interested in cologuard though this is not the traditional guideline-based use, but it fits with her goals and situation since the anesthesia may have been the cause of her prior MS flare.  She will go get her mammogram--does them in the fall and likes to stick to that schedule--does think she missed her last.   In 2011, had shingles on her right thoracic area--it was miserable.  Does not want it again.  We discussed shingrix and how it works.  Omeprazole has been due to hiatal hernia since some projectile vomiting after an MS med.  Has tried to come off omeprazole.  Did the elevating the bed and diet etc already.    Past Medical History:  Diagnosis Date  . Allergy   . Essential hypertension 08/25/2018  . Hot flashes, menopausal 10/13/2011   Estradiol started   . Multiple sclerosis ([place])   . Neuropathy   . Osteoarthritis   . Vision abnormalities    Past Surgical History:  Procedure Laterality Date  . ABDOMINAL HYSTERECTOMY  2002   partial  . LUMBAR FUSION      Social History   Socioeconomic History  . Marital status: Widowed    Spouse name: Not on file  . Number of children: Not on file  . Years of education: Not on file  . Highest education level: Not on  file  Occupational History  . Occupation: retired  Scientific laboratory technician  . Financial resource strain: Not on file  . Food insecurity:    Worry: Not on file    Inability: Not on file  . Transportation needs:    Medical: Not on file    Non-medical: Not on file  Tobacco Use  . Smoking status: Former Smoker    Packs/day: 0.50    Years: 15.00    Pack years: 7.50    Types: Cigarettes    Last attempt to quit: 08/05/1983    Years  since quitting: 35.1  . Smokeless tobacco: Never Used  Substance and Sexual Activity  . Alcohol use: Yes    Alcohol/week: 0.0 standard drinks    Comment: rarely  . Drug use: No  . Sexual activity: Not Currently    Birth control/protection: Post-menopausal  Lifestyle  . Physical activity:    Days per week: Not on file    Minutes per session: Not on file  . Stress: Not on file  Relationships  . Social connections:    Talks on phone: Not on file    Gets together: Not on file    Attends religious service: Not on file    Active member of club or organization: Not on file    Attends meetings of clubs or organizations: Not on file    Relationship status: Not on file  Other Topics Concern  . Not on file  Social History Narrative   Regular Exercise-no   Widowed   Retired   Left handed       Tobacco use, amount per day now: former   Past tobacco use, amount per day: 1/2-1 packet   How many years did you use tobacco: 12 years   Alcohol use (drinks per week): 0   Diet: mediterranean based   Do you drink/eat things with caffeine: yes   Marital status:        widow                          What year were you married? 1987   Do you live in a house, apartment, assisted living, condo, trailer, etc.? house   Is it one or more stories? 1 story   How many persons live in your home? 1   Do you have pets in your home?( please list) no   Current or past profession: Science writer    Do you exercise?          yes                        Type and how often?  Tai chi and pt therapy, stretches etc....   Do you have a living will? yes   Do you have a DNR form?      no                             If not, do you want to discuss one? yes   Do you have signed POA/HPOA forms?      no                  If so, please bring to you appointment    reports that she quit smoking about 35 years ago. Her smoking use included cigarettes. She has a 7.50 pack-year smoking history. She has never used smokeless tobacco. She reports current alcohol use. She reports that she does not use drugs.  Functional Status Survey:    Family History  Problem Relation Age of Onset  . Heart failure Mother   . Heart failure Father   . Arthritis Other   . Hypertension Other     Health Maintenance  Topic Date Due  . Hepatitis C Screening  [birth year]  . DEXA SCAN  12/20/2012  . MAMMOGRAM  02/21/2017  . COLONOSCOPY  03/30/2017  . PNA vac Low Risk Adult (2 of 2 - PPSV23) 08/31/2019  .  TETANUS/TDAP  12/05/2019  . INFLUENZA VACCINE  Completed    No Known Allergies  Outpatient Encounter Medications as of 08/30/2018  Medication Sig  . aspirin 81 MG tablet Take 81 mg by mouth daily.  . calcium carbonate (OS-CAL) 600 MG TABS Take 600 mg by mouth daily.  . cholecalciferol (VITAMIN D) 1000 UNITS tablet Take 1,000 Units by mouth daily.   [name] Kitchen estradiol (CLIMARA - DOSED IN MG/24 HR) 0.0375 mg/24hr patch Place 1 patch onto the skin once a week.   . fexofenadine (ALLEGRA) 180 MG tablet Take 180 mg by mouth daily.  [name] Kitchen leflunomide (ARAVA) 20 MG tablet Take 1 tablet (20 mg total) by mouth daily.  . modafinil (PROVIGIL) 200 MG tablet TAKE 1 TABLET EVERY MORNING AND ONE-HALF TABLET EVERY AFTERNOON  . omeprazole (PRILOSEC) 40 MG capsule TAKE 1 CAPSULE DAILY BY MOUTH.  . Polyethylene Glycol 3350 (MIRALAX PO) Take by mouth daily as needed.   . progesterone (PROMETRIUM) 100 MG capsule TAKE 1 CAPSULE BY MOUTH EVERY DAY AT BEDTIME  . VESICARE 5 MG tablet TAKE 1 TABLET (5 MG TOTAL) BY MOUTH DAILY.    [name] Kitchen VITAMIN D PO Take 5,000 Units by mouth daily.  . [DISCONTINUED] clonazePAM (KLONOPIN) 0.5 MG tablet Take 1 tablet (0.5 mg total) by mouth at bedtime.  . [DISCONTINUED] etodolac (LODINE) 400 MG tablet Take 400 mg by mouth 2 (two) times daily as needed.   [name] Kitchen losartan (COZAAR) 25 MG tablet Take 1 tablet (25 mg total) by mouth daily.  . [EXPIRED] Zoster Vaccine Adjuvanted [facility]) injection Inject 0.5 mLs into the muscle once for 1 dose.   No facility-administered encounter medications on file as of 08/30/2018.     Review of Systems  Constitutional: Positive for malaise/fatigue. Negative for fever.       Recent weight gain  HENT: Positive for congestion and sinus pain. Negative for hearing loss and tinnitus.   Eyes:       Corrective lenses, follows with optometrist  Respiratory: Negative for cough and shortness of breath.   Cardiovascular: Negative for chest pain, palpitations and leg swelling.  Gastrointestinal: Positive for constipation and heartburn. Negative for abdominal pain, blood in stool, diarrhea, melena, nausea and vomiting.       Hiatal hernia  Genitourinary: Positive for frequency and urgency. Negative for dysuria and hematuria.       Incontinence  Musculoskeletal: Positive for back pain, joint pain and myalgias. Negative for falls.  Skin: Negative for itching and rash.  Neurological: Positive for dizziness, tingling, sensory change, focal weakness and weakness. Negative for loss of consciousness.  Endo/Heme/Allergies: Positive for environmental allergies.  Psychiatric/Behavioral: Positive for memory loss. Negative for depression, hallucinations and substance abuse. The patient has insomnia. The patient is not nervous/anxious.     Vitals:   08/30/18 0911  BP: (!) 144/88  Pulse: 74  Resp: 10  Temp: 98 F (36.7 C)  TempSrc: Oral  SpO2: Dawn%  Weight: 188 lb (85.3 kg)  Height: _0  (1.575 m)   Body mass index is 34.39 kg/m. Physical Exam Constitutional:       General: She is not in acute distress.    Appearance: Normal appearance. She is obese. She is not toxic-appearing.  HENT:     Head: Normocephalic and atraumatic.     Right Ear: External ear normal.     Left Ear: External ear normal.     Nose: Congestion present.     Mouth/Throat:     Mouth: Mucous membranes are dry.  Pharynx: Oropharynx is clear.  Eyes:     Conjunctiva/sclera: Conjunctivae normal.     Pupils: Pupils are equal, round, and reactive to light.  Neck:     Musculoskeletal: Normal range of motion and neck supple. No neck rigidity or muscular tenderness.     Vascular: No carotid bruit.  Cardiovascular:     Rate and Rhythm: Normal rate.     Pulses: Normal pulses.     Heart sounds: Normal heart sounds.  Pulmonary:     Effort: Pulmonary effort is normal.     Breath sounds: Normal breath sounds. No wheezing, rhonchi or rales.  Abdominal:     General: Bowel sounds are normal.  Musculoskeletal: Normal range of motion.     Right lower leg: No edema.     Left lower leg: No edema.     Comments: Ambulating w/o assistive device at present  Lymphadenopathy:     Cervical: No cervical adenopathy.  Skin:    General: Skin is warm and dry.  Neurological:     General: No focal deficit present.     Mental Status: She is alert and oriented to person, place, and time.  Psychiatric:        Mood and Affect: Mood normal.        Behavior: Behavior normal.        Thought Content: Thought content normal.        Judgment: Judgment normal.     Labs reviewed: Basic Metabolic Panel: Recent Labs    03/09/18 1342 08/25/18 1215  NA 140 140  K 4.1 4.4  CL 107 102  CO2 26 20  GLUCOSE 88 84  BUN 20 19  CREATININE 0.75 0.71  CALCIUM 9.2 9.4   Liver Function Tests: Recent Labs    11/13/17 1003 03/09/18 1342 08/25/18 1215  AST  --  20 22  ALT  --  22 25  ALKPHOS  --  119 126*  BILITOT  --  0.4 0.3  PROT 6.3 6.5 6.2  ALBUMIN  --  3.8 4.3   No results for input(s): LIPASE,  AMYLASE in the last 8760 hours. No results for input(s): AMMONIA in the last 8760 hours. CBC: Recent Labs    03/09/18 1342 08/25/18 1215  WBC 4.9 4.1  NEUTROABS 3.9 2.8  HGB 13.6 13.9  HCT 41.3 41.4  MCV 89.5 89  PLT 171 216   Cardiac Enzymes: No results for input(s): CKTOTAL, CKMB, CKMBINDEX, TROPONINI in the last 8760 hours. BNP: Invalid input(s): POCBNP No results found for: HGBA1C Lab Results  Component Value Date   TSH 3.720 08/25/2018   Assessment/Plan 1. MULTIPLE SCLEROSIS, PROGRESSIVE/RELAPSING -followed by Dr. Felecia Shelling -a lot of her symptoms seem MS-induced but are made worse by the aging process -has MRI coming up to see if her imaging is stable from this perspective -continues on leflunomide (arava) for this daily as disease modifier  2. Need for shingles vaccine - agrees to get this done after a couple of weeks (given prevnar today so we opted to spread them out) - Zoster Vaccine Adjuvanted [facility]) injection; Inject 0.5 mLs into the muscle once for 1 dose.  Dispense: 0.5 mL; Refill: 1  3. Colon cancer screening -agrees to cologuard to avoid anesthesia as she had a flare after her back surgery and wants to avoid further sedation and surgery -hopefully, the cologuard will be negative -I'm aware this is not the classic guideline-based approach, but, upon discussion with the patient, it's consistent with her personal  goals and, if the cologuard is positive, we will again discuss colonoscopy  4. Need for vaccination with 13-polyvalent pneumococcal conjugate vaccine - Pneumococcal conjugate vaccine 13-valent given today  5. Essential hypertension -bp has been elevated at recent appts -pt is going to work on her diet and exercise regimen in hopes of losing weight and track her bp at home--advised she may provide me with these results--she will check bid for 4 wks and then bring the results to her next visit  6. Chronic fatigue -pt just had a full panel of labs done  recently -recommended return to her regular exercise regimen and healthy diet to help with this -seems due primarily to [place] in her case  -await MRI ordered by neurology   7. Body mass index (BMI) of 34.0-34.9 in adult -pt is aware of her obesity status and plans to work to lose weight -also has flare ups with steroid need that are not helpful and chronic pain that limits mobility some  8. Class 1 obesity due to excess calories without serious comorbidity with body mass index (BMI) of 34.0 to 34.9 in adult -as above, counseled on diet and exercise  Labs/tests ordered:   Orders Placed This Encounter  Procedures  . Pneumococcal conjugate vaccine 13-valent   4 wks with me  Ellen Mayol L. Prudence Heiny, D.O. [facility] [address], [place] [address] Cell Phone (Mon-Fri 8am-5pm):  [phone] On Call:  [phone] & follow prompts after 5pm & weekends Office Phone:  [phone] Office Fax:  [phone]

## 2018-08-30 NOTE — Patient Instructions (Signed)
We gave you your pneumonia vaccine today (prevnar-13).   We will get you a cologuard box delivered for colon cancer screening.  If this is positive, we will need to discuss and likely proceed for colonoscopy. I have sent the shingrix vaccine prescription to your pharmacy with one refill.  Please wait at least 2 weeks to get this since you received your prevnar today.   Please work on increasing your aerobic exercise and diet to lose weight to help your blood pressure. Check your blood pressure twice a day and record the results.  You may bring those for your next visit in 4 weeks.    It was a pleasure meeting you today!

## 2018-08-30 NOTE — Telephone Encounter (Signed)
-----   Message from Britt Bottom, MD sent at 08/27/2018  3:52 PM EST ----- Please let the patient know that the lab work including the urinalysis and culture were normal.   Vitamin D is low normal and she should take 5000 units daily OTC.

## 2018-09-03 ENCOUNTER — Other Ambulatory Visit: Payer: Self-pay | Admitting: Neurology

## 2018-09-03 MED ORDER — CLONAZEPAM 0.5 MG PO TABS: 0.5000 mg | ORAL_TABLET | Freq: Every day | ORAL | 5 refills | Status: DC

## 2018-09-05 ENCOUNTER — Encounter: Payer: Self-pay | Admitting: Internal Medicine

## 2018-09-13 ENCOUNTER — Encounter (INDEPENDENT_AMBULATORY_CARE_PROVIDER_SITE_OTHER): Payer: Self-pay | Admitting: Orthopaedic Surgery

## 2018-09-13 ENCOUNTER — Ambulatory Visit
Admission: RE | Admit: 2018-09-13 | Discharge: 2018-09-13 | Disposition: A | Discharge status: Home / Self Care | Payer: Medicare Other | Source: Ambulatory Visit | Attending: Neurology | Admitting: Neurology

## 2018-09-13 ENCOUNTER — Ambulatory Visit (INDEPENDENT_AMBULATORY_CARE_PROVIDER_SITE_OTHER): Payer: Medicare Other | Admitting: Orthopaedic Surgery

## 2018-09-13 VITALS — BP 170/81 | HR 77 | Ht 62.0 in | Wt 188.0 lb

## 2018-09-13 DIAGNOSIS — G8929 Other chronic pain: Secondary | ICD-10-CM | POA: Diagnosis not present

## 2018-09-13 DIAGNOSIS — M5442 Lumbago with sciatica, left side: Secondary | ICD-10-CM | POA: Diagnosis not present

## 2018-09-13 DIAGNOSIS — G35 Multiple sclerosis: Secondary | ICD-10-CM | POA: Diagnosis not present

## 2018-09-13 MED ORDER — GADOBENATE DIMEGLUMINE 529 MG/ML IV SOLN
17.0000 mL | Freq: Once | INTRAVENOUS | Status: AC | PRN
  Administered 2018-09-13: 17 mL via INTRAVENOUS

## 2018-09-13 NOTE — Progress Notes (Signed)
Office Visit Note   Patient: Dawn [name]           Date of Birth: 10-07-1945           MRN: [id number] Visit Date: 09/13/2018              Requested by: Gayland Curry, DO [place], [place] [address] PCP: Gayland Curry, DO   Assessment & Plan: Visit Diagnoses:  1. Chronic left-sided low back pain with left-sided sciatica     Plan: Persistent yet slightly decreased chronic back pain associated with left lower extremity radiculopathy.  Long discussion over 30 minutes regarding her present problem and probable diagnosis referable to her lumbar spine.  MRI scan performed in 2019 demonstrates mild canal stenosis at L2-3 and L3-4 with moderate foraminal stenosis at 3 4 to the right.  Symptoms are referable to the left.  I think at this point it is worth considering an epidural steroid injection.  Would like to see her back in about a month  Follow-Up Instructions: Return in about 1 month (around 10/12/2018).   Orders:  No orders of the defined types were placed in this encounter.  No orders of the defined types were placed in this encounter.     Procedures: No procedures performed   Clinical Data: No additional findings.   Subjective: Chief Complaint  Patient presents with  . Left Hip - Pain  Patient presents today for follow up on her lower back pain that radiates down her left leg. She said that it radiates down to her foot. She has some some numbness in both feet from MS. She said that it hurts constantly. She said it is hard to get comfortable. She finished the dosepak, which helped. But it has now flared up again. Has evidence of arthritis of her right hip which does not appear to be symptomatic.  Pain is referred as far distally as her left foot from the on the left side.  Prior MRI scan findings as above.  She has been followed by Dr. Felecia Shelling from neurology for her MS.  Also has had injections through his office in both of her greater trochanters and even in  her "back".  She thinks she may have had an epidural steroid injection for 5 years ago  HPI  Review of Systems  Constitutional: Positive for fatigue.  HENT: Negative for ear pain.   Eyes: Negative for pain.  Respiratory: Negative for shortness of breath.   Cardiovascular: Negative for leg swelling.  Gastrointestinal: Positive for constipation and diarrhea.  Endocrine: Positive for cold intolerance and heat intolerance.  Genitourinary: Positive for difficulty urinating.  Musculoskeletal: Negative for joint swelling.  Skin: Negative for rash.  Allergic/Immunologic: Negative for food allergies.  Neurological: Positive for weakness.  Hematological: Does not bruise/bleed easily.  Psychiatric/Behavioral: Positive for sleep disturbance.     Objective: Vital Signs: BP (!) 170/81   Pulse 77   Ht 5\' 2"  (1.575 m)   Wt 188 lb (85.3 kg)   BMI 34.39 kg/m   Physical Exam Constitutional:      Appearance: She is well-developed.  Eyes:     Pupils: Pupils are equal, round, and reactive to light.  Pulmonary:     Effort: Pulmonary effort is normal.  Skin:    General: Skin is warm and dry.  Neurological:     Mental Status: She is alert and oriented to person, place, and time.  Psychiatric:  Behavior: Behavior normal.     Ortho Exam awake alert and oriented x3 comfortable sitting.  Does have MS.  Straight leg raise was negative bilaterally.  Does have some limitation of motion of her right hip compared to her left with prior films demonstrating arthritis.  No percussible tenderness of the lumbar spine.  Mild ankle edema no calf pain.  No knee pain  Specialty Comments:  No specialty comments available.  Imaging: No results found.   PMFS History: Patient Active Problem List   Diagnosis Date Noted  . Essential hypertension 08/25/2018  . Abnormal SPEP 02/19/2018  . Hand pain 02/20/2017  . Multiple joint pain 02/18/2017  . Right elbow pain 12/09/2016  . Disturbed cognition  06/25/2016  . Trochanteric bursitis of left hip 02/18/2016  . Sciatica, right side 10/30/2015  . Bilateral arm pain 10/10/2015  . Trochanteric bursitis of right hip 08/04/2014  . Adjustment disorder with mixed anxiety and depressed mood 08/04/2014  . Chronic fatigue 08/04/2014  . Urinary frequency 08/04/2014  . Abnormality of gait 08/04/2014  . Unspecified visual disturbance 01/31/2013  . Transient vision disturbance 01/31/2013  . Routine general medical examination at a health care facility 11/04/2011  . Colon polyps 11/03/2011  . Hot flashes, menopausal 10/13/2011  . POSTHERPETIC NEURALGIA 10/03/2009  . DISPLCMT LUMBAR INTERVERT [place] W/O MYELOPATHY 09/05/2009  . RENAL CALCULUS, RECURRENT 09/04/2009  . HYPERLIPIDEMIA 08/31/2009  . MULTIPLE SCLEROSIS, PROGRESSIVE/RELAPSING 08/31/2009  . ALLERGIC RHINITIS 08/31/2009   Past Medical History:  Diagnosis Date  . Allergy   . Essential hypertension 08/25/2018  . Hot flashes, menopausal 10/13/2011   Estradiol started   . Multiple sclerosis ([place])   . Neuropathy   . Osteoarthritis   . Vision abnormalities     Family History  Problem Relation Age of Onset  . Heart failure Mother   . Heart failure Father   . Arthritis Other   . Hypertension Other     Past Surgical History:  Procedure Laterality Date  . ABDOMINAL HYSTERECTOMY  2002   partial  . LUMBAR FUSION     Social History   Occupational History  . Occupation: retired  Tobacco Use  . Smoking status: Former Smoker    Packs/day: 0.50    Years: 15.00    Pack years: 7.50    Types: Cigarettes    Last attempt to quit: 08/05/1983    Years since quitting: 35.1  . Smokeless tobacco: Never Used  Substance and Sexual Activity  . Alcohol use: Yes    Alcohol/week: 0.0 standard drinks    Comment: rarely  . Drug use: No  . Sexual activity: Not Currently    Birth control/protection: Post-menopausal

## 2018-09-13 NOTE — Addendum Note (Signed)
Addended by: Lendon Collar on: 09/13/2018 04:20 PM   Modules accepted: Orders

## 2018-09-14 ENCOUNTER — Telehealth: Payer: Self-pay | Admitting: *Deleted

## 2018-09-14 NOTE — Telephone Encounter (Signed)
I called and spoke with pt about MRI results. She verbalized understanding.  The wrong insurance plan was selected by agent that works with her. Her modafinil is going to cost her over 600 dollars. I offered to have her get rx from [facility] to use good rx coupon and she can get it for about 45 dollars a month. She declined stating she cannot get to pharmacy every month to get prescription. She is going to call insurance back and have them re-fax paperwork they sent last week that we never received. Gave her fax [phone].

## 2018-09-14 NOTE — Telephone Encounter (Signed)
-----   Message from Britt Bottom, MD sent at 09/13/2018  6:54 PM EST ----- Please let her know that the MRI did not show any new findings.  Was unchanged.

## 2018-09-16 ENCOUNTER — Ambulatory Visit
Admission: RE | Admit: 2018-09-16 | Discharge: 2018-09-16 | Disposition: A | Discharge status: Home / Self Care | Payer: Medicare Other | Source: Ambulatory Visit | Attending: Orthopaedic Surgery | Admitting: Orthopaedic Surgery

## 2018-09-16 DIAGNOSIS — G8929 Other chronic pain: Secondary | ICD-10-CM

## 2018-09-16 DIAGNOSIS — M5442 Lumbago with sciatica, left side: Principal | ICD-10-CM

## 2018-09-16 DIAGNOSIS — M48061 Spinal stenosis, lumbar region without neurogenic claudication: Secondary | ICD-10-CM | POA: Diagnosis not present

## 2018-09-16 MED ORDER — IOPAMIDOL (ISOVUE-M 200) INJECTION 41%
1.0000 mL | Freq: Once | INTRAMUSCULAR | Status: AC
  Administered 2018-09-16: 1 mL via EPIDURAL

## 2018-09-16 MED ORDER — METHYLPREDNISOLONE ACETATE 40 MG/ML INJ SUSP (RADIOLOG
120.0000 mg | Freq: Once | INTRAMUSCULAR | Status: AC
  Administered 2018-09-16: 120 mg via EPIDURAL

## 2018-09-16 NOTE — Discharge Instructions (Signed)

## 2018-09-23 ENCOUNTER — Telehealth: Payer: Self-pay | Admitting: *Deleted

## 2018-09-23 NOTE — Telephone Encounter (Signed)
Initiated PA modafinil on covermymeds. Key: [id number]. Waiting for OV noted to be attached to PA and then will submit.

## 2018-09-23 NOTE — Telephone Encounter (Signed)
Submitted PA. Waiting on determination. °

## 2018-09-27 ENCOUNTER — Telehealth (INDEPENDENT_AMBULATORY_CARE_PROVIDER_SITE_OTHER): Payer: Self-pay | Admitting: Orthopaedic Surgery

## 2018-09-27 NOTE — Telephone Encounter (Signed)
Fax received from [facility], phone# [phone]. Modafinil approved under pt's Medicare Part D benefit. Approved for #45/30, up to a 90 day supply, from 09/23/18 until further notice. Ticket# [id number]. Reviewer: Maida Sale. LOB: PDG-Prescription Drug Plan. Pt. ID# [id number]

## 2018-09-27 NOTE — Telephone Encounter (Signed)
Patient called stating she had epidural injection on 09/16/18.  Patient states she feels approximately 50% better, but still experiencing pain in her back and left leg (both front and back).  Patient is requesting a return call to discuss what to do next.

## 2018-09-28 ENCOUNTER — Other Ambulatory Visit: Payer: Self-pay

## 2018-09-28 ENCOUNTER — Ambulatory Visit (INDEPENDENT_AMBULATORY_CARE_PROVIDER_SITE_OTHER): Payer: Medicare Other | Admitting: Family

## 2018-09-28 ENCOUNTER — Encounter: Payer: Self-pay | Admitting: Family

## 2018-09-28 VITALS — BP 150/92 | HR 66 | Temp 98.1°F | Resp 18 | Ht 62.0 in | Wt 193.0 lb

## 2018-09-28 DIAGNOSIS — K5901 Slow transit constipation: Secondary | ICD-10-CM | POA: Diagnosis not present

## 2018-09-28 DIAGNOSIS — I10 Essential (primary) hypertension: Secondary | ICD-10-CM

## 2018-09-28 NOTE — Telephone Encounter (Signed)
Spoke to her.  About 50% better.  Discussed PT (which she is doing) and repeat epidural.  Discussed possiblity of surgery but she is not interested.  She will continue exercises and call if worsens or not improving more

## 2018-09-28 NOTE — Patient Instructions (Addendum)
1. Take losartan 25 mg tablet one by mouth daily. 2. Please check blood pressure daily and write on log also include any symptoms you might be having including pain at the time of checking blood pressure. 3. Recommended low carbohydrate,low saturated fats,high vegetable diet and exercise as tolerated.   Hypertension Hypertension, commonly called high blood pressure, is when the force of blood pumping through the arteries is too strong. The arteries are the blood vessels that carry blood from the heart throughout the body. Hypertension forces the heart to work harder to pump blood and may cause arteries to become narrow or stiff. Having untreated or uncontrolled hypertension can cause heart attacks, strokes, kidney disease, and other problems. A blood pressure reading consists of a higher number over a lower number. Ideally, your blood pressure should be below 120/80. The first ("top") number is called the systolic pressure. It is a measure of the pressure in your arteries as your heart beats. The second ("bottom") number is called the diastolic pressure. It is a measure of the pressure in your arteries as the heart relaxes. What are the causes? The cause of this condition is not known. What increases the risk? Some risk factors for high blood pressure are under your control. Others are not. Factors you can change  Smoking.  Having type 2 diabetes mellitus, high cholesterol, or both.  Not getting enough exercise or physical activity.  Being overweight.  Having too much fat, sugar, calories, or salt (sodium) in your diet.  Drinking too much alcohol. Factors that are difficult or impossible to change  Having chronic kidney disease.  Having a family history of high blood pressure.  Age. Risk increases with age.  Race. You may be at higher risk if you are African-American.  Gender. Men are at higher risk than women before age 42. After age 29, women are at higher risk than men.  Having  obstructive sleep apnea.  Stress. What are the signs or symptoms? Extremely high blood pressure (hypertensive crisis) may cause:  Headache.  Anxiety.  Shortness of breath.  Nosebleed.  Nausea and vomiting.  Severe chest pain.  Jerky movements you cannot control (seizures). How is this diagnosed? This condition is diagnosed by measuring your blood pressure while you are seated, with your arm resting on a surface. The cuff of the blood pressure monitor will be placed directly against the skin of your upper arm at the level of your heart. It should be measured at least twice using the same arm. Certain conditions can cause a difference in blood pressure between your right and left arms. Certain factors can cause blood pressure readings to be lower or higher than normal (elevated) for a short period of time:  When your blood pressure is higher when you are in a health care provider's office than when you are at home, this is called white coat hypertension. Most people with this condition do not need medicines.  When your blood pressure is higher at home than when you are in a health care provider's office, this is called masked hypertension. Most people with this condition may need medicines to control blood pressure. If you have a high blood pressure reading during one visit or you have normal blood pressure with other risk factors:  You may be asked to return on a different day to have your blood pressure checked again.  You may be asked to monitor your blood pressure at home for 1 week or longer. If you are diagnosed with  hypertension, you may have other blood or imaging tests to help your health care provider understand your overall risk for other conditions. How is this treated? This condition is treated by making healthy lifestyle changes, such as eating healthy foods, exercising more, and reducing your alcohol intake. Your health care provider may prescribe medicine if lifestyle  changes are not enough to get your blood pressure under control, and if:  Your systolic blood pressure is above 130.  Your diastolic blood pressure is above 80. Your personal target blood pressure may vary depending on your medical conditions, your age, and other factors. Follow these instructions at home: Eating and drinking   Eat a diet that is high in fiber and potassium, and low in sodium, added sugar, and fat. An example eating plan is called the DASH (Dietary Approaches to Stop Hypertension) diet. To eat this way: ? Eat plenty of fresh fruits and vegetables. Try to fill half of your plate at each meal with fruits and vegetables. ? Eat whole grains, such as whole wheat pasta, brown rice, or whole grain bread. Fill about one quarter of your plate with whole grains. ? Eat or drink low-fat dairy products, such as skim milk or low-fat yogurt. ? Avoid fatty cuts of meat, processed or cured meats, and poultry with skin. Fill about one quarter of your plate with lean proteins, such as fish, chicken without skin, beans, eggs, and tofu. ? Avoid premade and processed foods. These tend to be higher in sodium, added sugar, and fat.  Reduce your daily sodium intake. Most people with hypertension should eat less than 1,500 mg of sodium a day.  Limit alcohol intake to no more than 1 drink a day for nonpregnant women and 2 drinks a day for men. One drink equals 12 oz of beer, 5 oz of wine, or 1 oz of hard liquor. Lifestyle   Work with your health care provider to maintain a healthy body weight or to lose weight. Ask what an ideal weight is for you.  Get at least 30 minutes of exercise that causes your heart to beat faster (aerobic exercise) most days of the week. Activities may include walking, swimming, or biking.  Include exercise to strengthen your muscles (resistance exercise), such as pilates or lifting weights, as part of your weekly exercise routine. Try to do these types of exercises for 30  minutes at least 3 days a week.  Do not use any products that contain nicotine or tobacco, such as cigarettes and e-cigarettes. If you need help quitting, ask your health care provider.  Monitor your blood pressure at home as told by your health care provider.  Keep all follow-up visits as told by your health care provider. This is important. Medicines  Take over-the-counter and prescription medicines only as told by your health care provider. Follow directions carefully. Blood pressure medicines must be taken as prescribed.  Do not skip doses of blood pressure medicine. Doing this puts you at risk for problems and can make the medicine less effective.  Ask your health care provider about side effects or reactions to medicines that you should watch for. Contact a health care provider if:  You think you are having a reaction to a medicine you are taking.  You have headaches that keep coming back (recurring).  You feel dizzy.  You have swelling in your ankles.  You have trouble with your vision. Get help right away if:  You develop a severe headache or confusion.  You  have unusual weakness or numbness.  You feel faint.  You have severe pain in your chest or abdomen.  You vomit repeatedly.  You have trouble breathing. Summary  Hypertension is when the force of blood pumping through your arteries is too strong. If this condition is not controlled, it may put you at risk for serious complications.  Your personal target blood pressure may vary depending on your medical conditions, your age, and other factors. For most people, a normal blood pressure is less than 120/80.  Hypertension is treated with lifestyle changes, medicines, or a combination of both. Lifestyle changes include weight loss, eating a healthy, low-sodium diet, exercising more, and limiting alcohol. This information is not intended to replace advice given to you by your health care provider. Make sure you  discuss any questions you have with your health care provider. Document Released: 07/07/2005 Document Revised: 06/04/2016 Document Reviewed: 06/04/2016 Elsevier Interactive Patient Education  2019 [employer].

## 2018-09-28 NOTE — Telephone Encounter (Signed)
Please advise 

## 2018-09-28 NOTE — Progress Notes (Signed)
Provider: Dinah [name] FNP-C  Gayland Curry, DO  Patient Care Team: Gayland Curry, DO as PCP - General (Geriatric Medicine) [name], MD as Consulting Physician (Orthopedic Surgery) [name], [name], MD (Neurology) [name], [name], [place] (Optometry) [name], NP as Nurse Practitioner (Obstetrics and Gynecology) [name], MD as Consulting Physician (Dermatology)  Extended Emergency Contact Information Primary Emergency Contact: Kathlen Mody Mobile Phone: [phone] Relation: Friend Secondary Emergency Contact: Crissie Reese Mobile Phone: [phone] Relation: Friend  Goals of care: Advanced Directive information Advanced Directives 09/28/2018  Does Patient Have a Medical Advance Directive? Yes  Type of Advance Directive Living will  Does patient want to make changes to medical advance directive? -  Copy of [place] in Chart? -     Chief Complaint  Patient presents with  . Medical Management of Chronic Issues    blood pressure follow up,patient  has not been taking the losartan    HPI:  Pt is a 73 y.o. female seen today for an acute visit for follow high blood pressure.she states has not taken losartan 25 mg tablet daily as instructed by provider in previous visit." I though I might have to stay on medication for life".she states has changed her diet,increased fluid intake and does meditation.also does Tai chi.she checks blood pressure at home with her wrist B/p cuff but forgot to bring in log for visit.she has had occasional headaches and dizziness though states dizziness is chronic to her.she denies any fever,chills,nausea,vomiting,chest pain or shortness of breath.of note she states has spinal stenosis with 4 herniated discs and chronic fatigue from multiple stenosis that limit her exercise.she has also worked with Physical Therapy in the past but maxed out.     Past Medical History:  Diagnosis Date  . Allergy   .  Essential hypertension 08/25/2018  . Hot flashes, menopausal 10/13/2011   Estradiol started   . Multiple sclerosis ([place])   . Neuropathy   . Osteoarthritis   . Vision abnormalities    Past Surgical History:  Procedure Laterality Date  . ABDOMINAL HYSTERECTOMY  2002   partial  . LUMBAR FUSION      No Known Allergies  Outpatient Encounter Medications as of 09/28/2018  Medication Sig  . aspirin 81 MG tablet Take 81 mg by mouth daily.  . calcium carbonate (OS-CAL) 600 MG TABS Take 600 mg by mouth daily.  . cholecalciferol (VITAMIN D) 1000 UNITS tablet Take 1,000 Units by mouth daily.   . clonazePAM (KLONOPIN) 0.5 MG tablet Take 1 tablet (0.5 mg total) by mouth at bedtime.  [name] Kitchen estradiol (CLIMARA - DOSED IN MG/24 HR) 0.0375 mg/24hr patch Place 1 patch onto the skin once a week.   . fexofenadine (ALLEGRA) 180 MG tablet Take 180 mg by mouth daily.  [name] Kitchen leflunomide (ARAVA) 20 MG tablet Take 1 tablet (20 mg total) by mouth daily.  [name] Kitchen losartan (COZAAR) 25 MG tablet Take 1 tablet (25 mg total) by mouth daily.  . modafinil (PROVIGIL) 200 MG tablet TAKE 1 TABLET EVERY MORNING AND ONE-HALF TABLET EVERY AFTERNOON  . omeprazole (PRILOSEC) 40 MG capsule TAKE 1 CAPSULE DAILY BY MOUTH.  . Polyethylene Glycol 3350 (MIRALAX PO) Take by mouth daily as needed.   . progesterone (PROMETRIUM) 100 MG capsule TAKE 1 CAPSULE BY MOUTH EVERY DAY AT BEDTIME  . VESICARE 5 MG tablet TAKE 1 TABLET (5 MG TOTAL) BY MOUTH DAILY.  [name] Kitchen VITAMIN D PO Take 5,000 Units by mouth daily.   No facility-administered encounter  medications on file as of 09/28/2018.     Review of Systems  Constitutional: Positive for fatigue. Negative for appetite change, chills, fever and unexpected weight change.       Chronic fatigue due to MS attends Multiple sclerosis support group.  HENT: Negative for congestion, rhinorrhea, sinus pressure, sinus pain, sneezing and sore throat.   Eyes: Positive for visual disturbance. Negative for pain, discharge,  redness and itching.       Wears eye glasses and follows up with ophthalmology   Respiratory: Negative for cough, chest tightness, shortness of breath and wheezing.   Cardiovascular: Negative for chest pain and leg swelling.       Has had occasional palpitation in the past but none recent.   Gastrointestinal: Positive for constipation. Negative for abdominal distention, abdominal pain, diarrhea, nausea and vomiting.  Endocrine: Negative for cold intolerance and heat intolerance.  Genitourinary: Negative for dysuria, flank pain and urgency.  Musculoskeletal: Positive for arthralgias.       Spine stenosis,multiple sclerosis  Skin: Negative for color change, pallor, rash and wound.  Neurological: Negative for syncope.       Headache and dizziness occasionally.chronic numbness on feet   Psychiatric/Behavioral: Negative for agitation and sleep disturbance. The patient is not nervous/anxious.     Immunization History  Administered Date(s) Administered  . Influenza Whole 05/14/2011  . Influenza, High Dose Seasonal PF 04/30/2017, 05/15/2018  . Pneumococcal Conjugate-13 08/30/2018  . Pneumococcal Polysaccharide-23 03/21/2009  . Td 12/04/2009  . Zoster 11/29/2009   Pertinent  Health Maintenance Due  Topic Date Due  . DEXA SCAN  12/20/2012  . MAMMOGRAM  02/21/2017  . COLONOSCOPY  03/30/2017  . PNA vac Low Risk Adult (2 of 2 - PPSV23) 08/31/2019  . INFLUENZA VACCINE  Completed   Fall Risk  09/28/2018 08/30/2018  Falls in the past year? 0 0  Number falls in past yr: 0 0  Injury with Fall? 0 0  Risk for fall due to : - Impaired balance/gait;Impaired mobility;Medication side effect;Other (Comment)  Risk for fall due to: Comment - MS  Follow up - Falls evaluation completed;Education provided;Falls prevention discussed    Vitals:   09/28/18 0954  BP: (!) 150/92  Pulse: 66  Resp: 18  Temp: 98.1 F (36.7 C)  TempSrc: Oral  SpO2: 98%  Weight: 193 lb (87.5 kg)  Height: '5\' 2"'  (1.575 m)    Body mass index is 35.3 kg/m. Physical Exam Constitutional:      General: She is not in acute distress.    Appearance: She is obese.  HENT:     Head: Normocephalic.     Right Ear: Tympanic membrane, ear canal and external ear normal. There is no impacted cerumen.     Left Ear: Tympanic membrane, ear canal and external ear normal. There is no impacted cerumen.     Nose: Nose normal. No congestion or rhinorrhea.     Mouth/Throat:     Mouth: Mucous membranes are moist.     Pharynx: Oropharynx is clear. No oropharyngeal exudate or posterior oropharyngeal erythema.  Eyes:     General: No scleral icterus.       Right eye: No discharge.        Left eye: No discharge.     Conjunctiva/sclera: Conjunctivae normal.     Pupils: Pupils are equal, round, and reactive to light.     Comments: Eye glasses in place   Neck:     Musculoskeletal: Normal range of motion. No neck rigidity  or muscular tenderness.     Vascular: No carotid bruit.  Cardiovascular:     Rate and Rhythm: Normal rate and regular rhythm.     Pulses: Normal pulses.     Heart sounds: Normal heart sounds. No murmur. No friction rub. No gallop.   Pulmonary:     Effort: Pulmonary effort is normal. No respiratory distress.     Breath sounds: Normal breath sounds. No wheezing, rhonchi or rales.  Chest:     Chest wall: No tenderness.  Abdominal:     General: Bowel sounds are normal. There is no distension.     Palpations: Abdomen is soft. There is no mass.     Tenderness: There is no abdominal tenderness. There is no right CVA tenderness, left CVA tenderness, guarding or rebound.  Musculoskeletal: Normal range of motion.     Right lower leg: No edema.     Left lower leg: No edema.  Lymphadenopathy:     Cervical: No cervical adenopathy.  Neurological:     Mental Status: She is alert and oriented to person, place, and time.     Cranial Nerves: No cranial nerve deficit.     Coordination: Coordination normal.     Gait: Gait  normal.  Psychiatric:        Mood and Affect: Mood normal.        Speech: Speech normal.        Behavior: Behavior normal.        Thought Content: Thought content normal.        Judgment: Judgment normal.     Labs reviewed: Recent Labs    03/09/18 1342 08/25/18 1215  NA 140 140  K 4.1 4.4  CL 107 102  CO2 26 20  GLUCOSE 88 84  BUN 20 19  CREATININE 0.75 0.71  CALCIUM 9.2 9.4   Recent Labs    11/13/17 1003 03/09/18 1342 08/25/18 1215  AST  --  20 22  ALT  --  22 25  ALKPHOS  --  119 126*  BILITOT  --  0.4 0.3  PROT 6.3 6.5 6.2  ALBUMIN  --  3.8 4.3   Recent Labs    03/09/18 1342 08/25/18 1215  WBC 4.9 4.1  NEUTROABS 3.9 2.8  HGB 13.6 13.9  HCT 41.3 41.4  MCV 89.5 89  PLT 171 216   Lab Results  Component Value Date   TSH 3.720 08/25/2018   No results found for: HGBA1C Lab Results  Component Value Date   CHOL 203 (H) 11/03/2011   HDL 44.60 11/03/2011   LDLCALC 136 (H) 12/05/2009   LDLDIRECT 130.2 11/03/2011   TRIG 185.0 (H) 11/03/2011   CHOLHDL 5 11/03/2011    Significant Diagnostic Results in last 30 days:  Mr Jeri Cos ZO Contrast  Result Date: 09/13/2018  [facility] [address], [place], [place] [address] [phone] NEUROIMAGING REPORT STUDY DATE: 09/13/2018 PATIENT NAME: YARIMA PENMAN DOB: 1946/05/11 MRN: [id number] EXAM: MRI Brain with and without contrast ORDERING CLINICIAN: Richard A. [name], MD. PhD CLINICAL HISTORY: 73 year old woman with multiple sclerosis COMPARISON FILMS: MRI 07/11/2016 TECHNIQUE:MRI of the brain with and without contrast was obtained utilizing 5 mm axial slices with T1, T2, T2 flair, SWI and diffusion weighted views.  T1 sagittal, T2 coronal and postcontrast views in the axial and coronal plane were obtained. CONTRAST: 17 ml Multihance IMAGING SITE: [facility], [place]. FINDINGS: On sagittal images, the spinal cord is imaged caudally to  C4 and is normal in caliber.   The  contents of the posterior fossa are of normal size and position.   The pituitary gland and optic chiasm appear normal.    Brain volume appears normal for age.   The ventricles are normal in size for age and without distortion.  There are no abnormal extra-axial collections of fluid.  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   A couple small punctate T2/flair hyperintense foci are also noted elsewhere in the cerebellar hemispheres.  The brainstem appears normal.  The deep gray matter appears normal.  In the hemispheres, there are multiple T2/flair hyperintense foci in the periventricular, juxtacortical and deep white matter of both hemispheres.  None of these appear to be acute.  There is no significant change when compared to the 07/11/2016 MRI.[name] Kitchen   Diffusion weighted images are normal.  Susceptibility weighted images are normal.   The VIIth/VIIIth nerve complex appears normal.  The mastoid air cells appear normal.  There have been bilateral lens replacements.  The orbits are otherwise normal..  There is a mucous retention cyst at the floor of the right maxillary sinus and minimal mucoperiosteal thickening in some of the ethmoid air cells..  Flow voids are identified within the major intracerebral arteries.  Incidental note is made of a diminutive vertebral and basilar arteries.  The posterior cerebral arteries appear to get flow through posterior communicating arteries. After the infusion of contrast material, a normal enhancement pattern is noted.   This MRI of the brain with and without contrast shows the following: 1.   Multiple T2/flair hyperintense foci in the hemispheres.  The pattern is consistent with chronic demyelinating plaque associated with multiple sclerosis though some foci likely also represent chronic microvascular ischemic change.  None of the foci enhance or appear to be acute and there is no change compared to the 2017 MRI. 2.   Remote left cerebellar  infarction.   3.   There is a normal enhancement pattern and no acute findings. INTERPRETING PHYSICIAN: Richard A. Felecia Shelling, MD, PhD, FAAN Certified in  Neuroimaging by [employer]   Epidural Steroid Injection - Lumbar/sacral (ancillary Performed)  Result Date: 09/16/2018 CLINICAL DATA:  Chronic low back and right lower extremity pain. On MR, mild canal stenosis L2-3, L3-4; moderate right foraminal stenosis L3-4 EXAM: LUMBAR EPIDURAL INJECTION: DIAGNOSTIC EPIDURAL INJECTION: THERAPEUTIC EPIDURAL INJECTION: PROCEDURE: The procedure, risks, benefits, and alternatives were explained to the patient. Questions regarding the procedure were encouraged and answered. The patient understands and consents to the procedure. An interlaminar approach was performed on the left at L3-4. The overlying skin was cleansed and anesthetized. A 20 gauge epidural needle was advanced using loss-of-resistance technique. Injection of Isovue-M 200 shows a good epidural pattern with spread above and below the level of needle placement, primarily on the left no vascular opacification is seen. 15m of Depo-Medrol mixed with 242mlidocaine 1% were instilled. The procedure was well-tolerated, and the patient was discharged thirty minutes following the injection in good condition. FLUOROSCOPY TIME:  16 seconds; 16 uGym2 DAP COMPLICATIONS: None immediate IMPRESSION: Technically successful epidural injection on the left at L3-4. Electronically Signed   By: D Lucrezia Europe.D.   On: 09/16/2018 16:09    Assessment/Plan 1. Essential hypertension B/p 150/92 this visit.discussed with patient to take losartan along with life style changes including dietary changes,meditation,tai chi and stretching/walking exercises.she verbalized understanding and willing to start on losartan then follow up in 4 weeks  to recheck blood pressure.Patient's concerns of being on the blood pressure medication for the rest of her life alleviated by  encouraging her to make life style modification and take medication as directed if blood pressure returns to normal will wean her off medication.on ASA for chest pain prophylaxis.No EKG for review.consider EKG on next visit.   2. Slow transit constipation Continue on miralax daily as needed.encouraged to add fiber to diet such as vegetables and ground flax seeds.continue on hydration.  Family/ staff Communication: Reviewed plan of care with patient.  Labs/tests ordered: None   Follow up : 4 weeks for blood pressure re-evaluation.   Sandrea Hughs, NP

## 2018-09-29 DIAGNOSIS — Z1212 Encounter for screening for malignant neoplasm of rectum: Secondary | ICD-10-CM | POA: Diagnosis not present

## 2018-09-29 DIAGNOSIS — Z1211 Encounter for screening for malignant neoplasm of colon: Secondary | ICD-10-CM | POA: Diagnosis not present

## 2018-09-29 LAB — COLOGUARD: Cologuard: NEGATIVE

## 2018-09-30 NOTE — Telephone Encounter (Signed)
thanks

## 2018-10-01 ENCOUNTER — Other Ambulatory Visit: Payer: Self-pay | Admitting: Neurology

## 2018-10-01 MED ORDER — MODAFINIL 200 MG PO TABS: ORAL_TABLET | ORAL | 5 refills | Status: DC

## 2018-10-01 MED ORDER — CLONAZEPAM 0.5 MG PO TABS: 0.5000 mg | ORAL_TABLET | Freq: Every day | ORAL | 5 refills | Status: DC

## 2018-10-01 NOTE — Telephone Encounter (Signed)
Pt request refills for clonazePAM (KLONOPIN) 0.5 MG tablet andmodafinil (PROVIGIL) 200 MG tablet sent to Kristopher Oppenheim at [facility]. [facility] at [facility]. Pt is using the [place] card. She is wanting to know if this can be done today, she is aware the clinic closes at noon today.

## 2018-10-01 NOTE — Telephone Encounter (Signed)
Called [facility] at [phone] and spoke with Tammy. cx rx tizanidine and modafinil on file. She filled both but pt has not picked up. They will reverse and cx.  I checked drug registry. She last refilled clonazepam 09/03/18 #30 and modafinil 06/19/18 #45

## 2018-10-06 ENCOUNTER — Encounter: Payer: Self-pay | Admitting: *Deleted

## 2018-10-13 ENCOUNTER — Other Ambulatory Visit: Payer: Self-pay | Admitting: Neurology

## 2018-10-19 ENCOUNTER — Other Ambulatory Visit: Payer: Self-pay | Admitting: Neurology

## 2018-10-19 MED ORDER — MODAFINIL 200 MG PO TABS: ORAL_TABLET | ORAL | 0 refills | Status: DC

## 2018-10-19 NOTE — Telephone Encounter (Signed)
ok 

## 2018-10-19 NOTE — Telephone Encounter (Signed)
Dawn [name] [phone] has called asking if pt can get a 90 day refill on pt's modafinil (PROVIGIL) 200 MG tablet.  Please call

## 2018-10-19 NOTE — Addendum Note (Signed)
Addended by: Hope Pigeon on: 10/19/2018 12:11 PM   Modules accepted: Orders

## 2018-10-25 ENCOUNTER — Other Ambulatory Visit: Payer: Self-pay

## 2018-10-25 ENCOUNTER — Telehealth: Payer: Self-pay | Admitting: Neurology

## 2018-10-25 ENCOUNTER — Telehealth: Payer: Self-pay

## 2018-10-25 ENCOUNTER — Encounter: Payer: Self-pay | Admitting: Internal Medicine

## 2018-10-25 ENCOUNTER — Ambulatory Visit (INDEPENDENT_AMBULATORY_CARE_PROVIDER_SITE_OTHER): Payer: Medicare Other | Admitting: Nurse Practitioner

## 2018-10-25 ENCOUNTER — Encounter: Payer: Self-pay | Admitting: Nurse Practitioner

## 2018-10-25 DIAGNOSIS — I10 Essential (primary) hypertension: Secondary | ICD-10-CM

## 2018-10-25 MED ORDER — LOSARTAN POTASSIUM 25 MG PO TABS: 25.0000 mg | ORAL_TABLET | Freq: Every day | ORAL | 3 refills | Status: DC

## 2018-10-25 NOTE — Telephone Encounter (Signed)
Discussed results with patient, patient verbalized understanding of results  

## 2018-10-25 NOTE — Progress Notes (Signed)
This service is provided via telemedicine  No vital signs collected/recorded due to the encounter was a telemedicine visit.   Location of patient (ex: home, work):  Home   Patient consents to a telephone visit:  Yes  Location of the provider (ex: office, home):  [employer], Office   Name of any referring provider: Gayland Curry, DO  Names of all persons participating in the telemedicine service and their role in the encounter:S.Chrae B/CMA, Sherrie Mustache, NP, and Patient   Time spent on call:  5 min with medical assistant   Virtual Visit via Telephone Note  I connected with Dawn [name] on 10/25/18 at  2:15 PM EDT by telephone and verified that I am speaking with the correct person using two identifiers.   I discussed the limitations, risks, security and privacy concerns of performing an evaluation and management service by telephone and the availability of in person appointments. I also discussed with the patient that there may be a patient responsible charge related to this service. The patient expressed understanding and agreed to proceed.     Careteam: Patient Care Team: Gayland Curry, DO as PCP - General (Geriatric Medicine) [name], MD as Consulting Physician (Orthopedic Surgery) [name], [name], MD (Neurology) [name], [name], [place] (Optometry) [name], NP as Nurse Practitioner (Obstetrics and Gynecology) [name], MD as Consulting Physician (Dermatology)  Advanced Directive information    No Known Allergies  Chief Complaint  Patient presents with  . Follow-up    4 week follow-up on blood pressure      HPI: Patient is a 73 y.o. female for tele-visit due to COVID-19 to follow up on blood pressure.   Pt started on losartan 25 mg by mouth daily at last office visit. Blood pressure was "low" when she started taking the losartan.  Takes losartan 25 mg by mouth daily in the evening.  1 hour after she takes her blood  pressure blood pressure 117/74, 114/80.  Other readings were 155/79, 151/89,131/80,148/85.  Takes medication ~6-7 pm after dinner.  Started taking medication in the evening because she was worried it would make her dizzy. Does not feel dizzy after she takes medication. Not having any side effects.   Questions if modafinil making her blood pressure higher.  Does not eat food high in sodium.  Exercise as she can with her MS. Stretches and such but has balance issues due to MS.    Review of Systems:  Review of Systems  Constitutional: Negative for chills, fever and malaise/fatigue.  Respiratory: Negative for cough and shortness of breath.   Cardiovascular: Negative for chest pain and palpitations.  Neurological: Negative for dizziness and headaches.    Past Medical History:  Diagnosis Date  . Allergy   . Essential hypertension 08/25/2018  . Hot flashes, menopausal 10/13/2011   Estradiol started   . Multiple sclerosis ([place])   . Neuropathy   . Osteoarthritis   . Vision abnormalities    Past Surgical History:  Procedure Laterality Date  . ABDOMINAL HYSTERECTOMY  2002   partial  . LUMBAR FUSION     Social History:   reports that she quit smoking about 35 years ago. Her smoking use included cigarettes. She has a 7.50 pack-year smoking history. She has never used smokeless tobacco. She reports current alcohol use. She reports that she does not use drugs.  Family History  Problem Relation Age of Onset  . Heart failure Mother   . Heart failure Father   .  Arthritis Other   . Hypertension Other     Medications: Patient's Medications  New Prescriptions   No medications on file  Previous Medications   ASPIRIN EC 81 MG TABLET    Take 81 mg by mouth daily.   CALCIUM CARBONATE (OS-CAL) 600 MG TABS    Take 600 mg by mouth daily.   CLONAZEPAM (KLONOPIN) 0.5 MG TABLET    Take 1 tablet (0.5 mg total) by mouth at bedtime.   ESTRADIOL (CLIMARA - DOSED IN MG/24 HR) 0.0375 MG/24HR PATCH     Place 1 patch onto the skin once a week.    FEXOFENADINE (ALLEGRA) 180 MG TABLET    Take 180 mg by mouth daily.   LEFLUNOMIDE (ARAVA) 20 MG TABLET    Take 1 tablet (20 mg total) by mouth daily.   LOSARTAN (COZAAR) 25 MG TABLET    Take 1 tablet (25 mg total) by mouth daily.   MISC NATURAL PRODUCTS (SUPER-D3+) 5000 UNITS CAPS    Take by mouth daily.   MODAFINIL (PROVIGIL) 200 MG TABLET    TAKE 1 TABLET EVERY MORNING AND ONE-HALF TABLET EVERY AFTERNOON   OMEPRAZOLE (PRILOSEC) 40 MG CAPSULE    TAKE 1 CAPSULE DAILY BY MOUTH.   POLYETHYLENE GLYCOL 3350 (MIRALAX PO)    Take by mouth daily as needed.    PROGESTERONE (PROMETRIUM) 100 MG CAPSULE    TAKE 1 CAPSULE BY MOUTH EVERY DAY AT BEDTIME   VESICARE 5 MG TABLET    TAKE 1 TABLET (5 MG TOTAL) BY MOUTH DAILY.  Modified Medications   No medications on file  Discontinued Medications   ASPIRIN 81 MG TABLET    Take 81 mg by mouth daily.   CHOLECALCIFEROL (VITAMIN D) 1000 UNITS TABLET    Take 1,000 Units by mouth daily.    VITAMIN D PO    Take 5,000 Units by mouth daily.     Physical Exam:  Unable due to tele-visit.   Labs reviewed: Basic Metabolic Panel: Recent Labs    03/09/18 1342 08/25/18 1215  NA 140 140  K 4.1 4.4  CL 107 102  CO2 26 20  GLUCOSE 88 84  BUN 20 19  CREATININE 0.75 0.71  CALCIUM 9.2 9.4  TSH  --  3.720   Liver Function Tests: Recent Labs    11/13/17 1003 03/09/18 1342 08/25/18 1215  AST  --  20 22  ALT  --  22 25  ALKPHOS  --  119 126*  BILITOT  --  0.4 0.3  PROT 6.3 6.5 6.2  ALBUMIN  --  3.8 4.3   No results for input(s): LIPASE, AMYLASE in the last 8760 hours. No results for input(s): AMMONIA in the last 8760 hours. CBC: Recent Labs    03/09/18 1342 08/25/18 1215  WBC 4.9 4.1  NEUTROABS 3.9 2.8  HGB 13.6 13.9  HCT 41.3 41.4  MCV 89.5 89  PLT 171 216   Lipid Panel: No results for input(s): CHOL, HDL, LDLCALC, TRIG, CHOLHDL, LDLDIRECT in the last 8760 hours. TSH: Recent Labs     08/25/18 1215  TSH 3.720   A1C: No results found for: HGBA1C   Assessment/Plan 1. Essential hypertension Improved after she takes her blood pressure in the evenings. Will have her start taking in the AM and monitor.  To cont low sodium diet.  - losartan (COZAAR) 25 MG tablet; Take 1 tablet (25 mg total) by mouth daily.  Dispense: 30 tablet; Refill: 3  Next appt: 12/06/2018 Janett Billow  Beaulah Corin, [facility] [phone]   Follow Up Instructions:    I discussed the assessment and treatment plan with the patient. The patient was provided an opportunity to ask questions and all were answered. The patient agreed with the plan and demonstrated an understanding of the instructions.   The patient was advised to call back or seek an in-person evaluation if the symptoms worsen or if the condition fails to improve as anticipated.  I provided 14 minutes of non-face-to-face time during this encounter.   Lauree Chandler, NP  avs printed and mailed.

## 2018-10-25 NOTE — Telephone Encounter (Signed)
Dr. [name]- what would you recommend? 

## 2018-10-25 NOTE — Telephone Encounter (Signed)
Patient had a tele-visit today and inquired about the results of her cologuard. At the time of call we were unable to locate results (did not show under health maintenance). I have added the cologuard modifier to now show under health maintenance. Results received in March 2020, Dr.Reed reviewed and sent to scanning.   Per verbal from Dr.Reed she was under the impression that Cologuard mailed results to patient.  I attempted to call patient and was unable to get through (busy signal)  I will try again later

## 2018-10-25 NOTE — Patient Instructions (Addendum)
Start taking losartan in the morning with AM medication.   Continue low sodium diet.    DASH Eating Plan DASH stands for "Dietary Approaches to Stop Hypertension." The DASH eating plan is a healthy eating plan that has been shown to reduce high blood pressure (hypertension). It may also reduce your risk for type 2 diabetes, heart disease, and stroke. The DASH eating plan may also help with weight loss. What are tips for following this plan?  General guidelines  Avoid eating more than 2,300 mg (milligrams) of salt (sodium) a day. If you have hypertension, you may need to reduce your sodium intake to 1,500 mg a day.  Limit alcohol intake to no more than 1 drink a day for nonpregnant women and 2 drinks a day for men. One drink equals 12 oz of beer, 5 oz of wine, or 1 oz of hard liquor.  Work with your health care provider to maintain a healthy body weight or to lose weight. Ask what an ideal weight is for you.  Get at least 30 minutes of exercise that causes your heart to beat faster (aerobic exercise) most days of the week. Activities may include walking, swimming, or biking.  Work with your health care provider or diet and nutrition specialist (dietitian) to adjust your eating plan to your individual calorie needs. Reading food labels   Check food labels for the amount of sodium per serving. Choose foods with less than 5 percent of the Daily Value of sodium. Generally, foods with less than 300 mg of sodium per serving fit into this eating plan.  To find whole grains, look for the word "whole" as the first word in the ingredient list. Shopping  Buy products labeled as "low-sodium" or "no salt added."  Buy fresh foods. Avoid canned foods and premade or frozen meals. Cooking  Avoid adding salt when cooking. Use salt-free seasonings or herbs instead of table salt or sea salt. Check with your health care provider or pharmacist before using salt substitutes.  Do not fry foods. Cook foods  using healthy methods such as baking, boiling, grilling, and broiling instead.  Cook with heart-healthy oils, such as olive, canola, soybean, or sunflower oil. Meal planning  Eat a balanced diet that includes: ? 5 or more servings of fruits and vegetables each day. At each meal, try to fill half of your plate with fruits and vegetables. ? Up to 6-8 servings of whole grains each day. ? Less than 6 oz of lean meat, poultry, or fish each day. A 3-oz serving of meat is about the same size as a deck of cards. One egg equals 1 oz. ? 2 servings of low-fat dairy each day. ? A serving of nuts, seeds, or beans 5 times each week. ? Heart-healthy fats. Healthy fats called Omega-3 fatty acids are found in foods such as flaxseeds and coldwater fish, like sardines, salmon, and mackerel.  Limit how much you eat of the following: ? Canned or prepackaged foods. ? Food that is high in trans fat, such as fried foods. ? Food that is high in saturated fat, such as fatty meat. ? Sweets, desserts, sugary drinks, and other foods with added sugar. ? Full-fat dairy products.  Do not salt foods before eating.  Try to eat at least 2 vegetarian meals each week.  Eat more home-cooked food and less restaurant, buffet, and fast food.  When eating at a restaurant, ask that your food be prepared with less salt or no salt, if possible.  What foods are recommended? The items listed may not be a complete list. Talk with your dietitian about what dietary choices are best for you. Grains Whole-grain or whole-wheat bread. Whole-grain or whole-wheat pasta. Brown rice. Modena Morrow. Bulgur. Whole-grain and low-sodium cereals. Pita bread. Low-fat, low-sodium crackers. Whole-wheat flour tortillas. Vegetables Fresh or frozen vegetables (raw, steamed, roasted, or grilled). Low-sodium or reduced-sodium tomato and vegetable juice. Low-sodium or reduced-sodium tomato sauce and tomato paste. Low-sodium or reduced-sodium canned  vegetables. Fruits All fresh, dried, or frozen fruit. Canned fruit in natural juice (without added sugar). Meat and other protein foods Skinless chicken or [place]. Ground chicken or [place]. Pork with fat trimmed off. Fish and seafood. Egg whites. Dried beans, peas, or lentils. Unsalted nuts, nut butters, and seeds. Unsalted canned beans. Lean cuts of beef with fat trimmed off. Low-sodium, lean deli meat. Dairy Low-fat (1%) or fat-free (skim) milk. Fat-free, low-fat, or reduced-fat cheeses. Nonfat, low-sodium ricotta or cottage cheese. Low-fat or nonfat yogurt. Low-fat, low-sodium cheese. Fats and oils Soft margarine without trans fats. Vegetable oil. Low-fat, reduced-fat, or light mayonnaise and salad dressings (reduced-sodium). Canola, safflower, olive, soybean, and sunflower oils. Avocado. Seasoning and other foods Herbs. Spices. Seasoning mixes without salt. Unsalted popcorn and pretzels. Fat-free sweets. What foods are not recommended? The items listed may not be a complete list. Talk with your dietitian about what dietary choices are best for you. Grains Baked goods made with fat, such as croissants, muffins, or some breads. Dry pasta or rice meal packs. Vegetables Creamed or fried vegetables. Vegetables in a cheese sauce. Regular canned vegetables (not low-sodium or reduced-sodium). Regular canned tomato sauce and paste (not low-sodium or reduced-sodium). Regular tomato and vegetable juice (not low-sodium or reduced-sodium). Angie Fava. Olives. Fruits Canned fruit in a light or heavy syrup. Fried fruit. Fruit in cream or butter sauce. Meat and other protein foods Fatty cuts of meat. Ribs. Fried meat. Berniece Salines. Sausage. Bologna and other processed lunch meats. Salami. Fatback. Hotdogs. Bratwurst. Salted nuts and seeds. Canned beans with added salt. Canned or smoked fish. Whole eggs or egg yolks. Chicken or [place] with skin. Dairy Whole or 2% milk, cream, and half-and-half. Whole or full-fat  cream cheese. Whole-fat or sweetened yogurt. Full-fat cheese. Nondairy creamers. Whipped toppings. Processed cheese and cheese spreads. Fats and oils Butter. Stick margarine. Lard. Shortening. Ghee. Bacon fat. Tropical oils, such as coconut, palm kernel, or palm oil. Seasoning and other foods Salted popcorn and pretzels. Onion salt, garlic salt, seasoned salt, table salt, and sea salt. Worcestershire sauce. Tartar sauce. Barbecue sauce. Teriyaki sauce. Soy sauce, including reduced-sodium. Steak sauce. Canned and packaged gravies. Fish sauce. Oyster sauce. Cocktail sauce. Horseradish that you find on the shelf. Ketchup. Mustard. Meat flavorings and tenderizers. Bouillon cubes. Hot sauce and Tabasco sauce. Premade or packaged marinades. Premade or packaged taco seasonings. Relishes. Regular salad dressings. Where to find more information:  National Heart, Lung, and [place]: https://wilson-eaton.com/  American Heart Association: www.heart.org Summary  The DASH eating plan is a healthy eating plan that has been shown to reduce high blood pressure (hypertension). It may also reduce your risk for type 2 diabetes, heart disease, and stroke.  With the DASH eating plan, you should limit salt (sodium) intake to 2,300 mg a day. If you have hypertension, you may need to reduce your sodium intake to 1,500 mg a day.  When on the DASH eating plan, aim to eat more fresh fruits and vegetables, whole grains, lean proteins, low-fat dairy, and heart-healthy fats.  Work  with your health care provider or diet and nutrition specialist (dietitian) to adjust your eating plan to your individual calorie needs. This information is not intended to replace advice given to you by your health care provider. Make sure you discuss any questions you have with your health care provider. Document Released: 06/26/2011 Document Revised: 06/30/2016 Document Reviewed: 06/30/2016 Elsevier Interactive Patient Education  2019 [employer].

## 2018-10-25 NOTE — Telephone Encounter (Signed)
Pt is taking leflunomide (ARAVA) 20 MG tablet, does this put her at a higher risk for covid-19. Please call to advise

## 2018-10-26 ENCOUNTER — Ambulatory Visit: Payer: Medicare Other | Admitting: Nurse Practitioner

## 2018-10-26 NOTE — Telephone Encounter (Signed)
I called and spoke with pt. Relayed Dr. Garth Bigness recommendation. She verbalized understanding.

## 2018-10-26 NOTE — Telephone Encounter (Signed)
Leflunomide is very similar to Aubagio.  It is likely safe for the most of the MS medications doing the COVID-19 crisis.  Her risk should not be much higher than other people her age.

## 2018-12-06 ENCOUNTER — Ambulatory Visit (INDEPENDENT_AMBULATORY_CARE_PROVIDER_SITE_OTHER): Payer: Medicare Other | Admitting: Internal Medicine

## 2018-12-06 ENCOUNTER — Other Ambulatory Visit: Payer: Self-pay

## 2018-12-06 ENCOUNTER — Encounter: Payer: Self-pay | Admitting: Internal Medicine

## 2018-12-06 VITALS — BP 130/76 | HR 75 | Temp 98.0°F | Ht 62.0 in | Wt 185.0 lb

## 2018-12-06 DIAGNOSIS — G35 Multiple sclerosis: Secondary | ICD-10-CM | POA: Diagnosis not present

## 2018-12-06 DIAGNOSIS — R5382 Chronic fatigue, unspecified: Secondary | ICD-10-CM

## 2018-12-06 DIAGNOSIS — I10 Essential (primary) hypertension: Secondary | ICD-10-CM

## 2018-12-06 DIAGNOSIS — E669 Obesity, unspecified: Secondary | ICD-10-CM

## 2018-12-06 DIAGNOSIS — K5901 Slow transit constipation: Secondary | ICD-10-CM | POA: Diagnosis not present

## 2018-12-06 DIAGNOSIS — Z6833 Body mass index (BMI) 33.0-33.9, adult: Secondary | ICD-10-CM | POA: Diagnosis not present

## 2018-12-06 MED ORDER — BLOOD PRESSURE MONITOR KIT: PACK | 0 refills | Status: DC

## 2018-12-06 NOTE — Progress Notes (Signed)
Location:  [facility] clinic Provider:  Dodi Leu L. Mariea Clonts, D.O., C.M.D.  Goals of Care:  Advanced Directives 09/28/2018  Does Patient Have a Medical Advance Directive? Yes  Type of Advance Directive Living will  Does patient want to make changes to medical advance directive? -  Copy of [place] in Chart? -     Chief Complaint  Patient presents with  . Medical Management of Chronic Issues    78[facility] follow-up    HPI: Patient is a 73y.o. female seen today for medical management of chronic diseases.    Says she is an introvert and entertains herself.  It's still been weird for her, too, this covid pandemic.    She is doing fine.  Her usual MS stuff with achy winter and fatigue.  She is missing tai chi and PT that keeps the edge.  Says she overdoes or underdoes w/o instruction.  Can't wait to be able to have guidance from PT again.  Blood pressure:  She is keeping a chart.  She used her cuff after ours and it's very off.  Hers read 30 pts higher than ours.  It's a wrist cuff and I wrote her a prescription today for an arm cuff instead.  Nerves have been fine outside of this being a scary time.  It's made her think about her time and how she spends it.  She's meditating more.    Bladder control remains awful.  Continues with vesicare.    Bowels are the usual--uses flaxseed every day in some way in food.  Hydrates well with water.  It's been harder to get vegetables.  She is doing the drive-thru farmer's market.    She reports eating too much.    Her mammogram and bone density got postponed.  Has an ob/gyn appt for those.    Sees dentist tomorrow.  Did cologuard test.  Very organized and test negative.  Past Medical History:  Diagnosis Date  . Allergy   . Essential hypertension 08/25/2018  . Hot flashes, menopausal 10/13/2011   Estradiol started   . Multiple sclerosis ([place]   . Neuropathy   . Osteoarthritis   . Vision abnormalities     Past Surgical History:   Procedure Laterality Date  . ABDOMINAL HYSTERECTOMY  2002   partial  . LUMBAR FUSION      No Known Allergies  Outpatient Encounter Medications as of 12/06/2018  Medication Sig  . aspirin EC 81 MG tablet Take 81 mg by mouth daily.  . calcium carbonate (OS-CAL) 600 MG TABS Take 600 mg by mouth daily.  . clonazePAM (KLONOPIN) 0.5 MG tablet Take 1 tablet (0.5 mg total) by mouth at bedtime.  .[name] Kitchenestradiol (CLIMARA - DOSED IN MG/24 HR) 0.0375 mg/24hr patch Place 1 patch onto the skin once a week.   . fexofenadine (ALLEGRA) 180 MG tablet Take 180 mg by mouth daily.  .[name] Kitchenleflunomide (ARAVA) 20 MG tablet Take 1 tablet (20 mg total) by mouth daily.  .[name] Kitchenlosartan (COZAAR) 25 MG tablet Take 1 tablet (25 mg total) by mouth daily.  . Misc Natural Products (SUPER-D3+) 5000 units CAPS Take by mouth daily.  . modafinil (PROVIGIL) 200 MG tablet TAKE 1 TABLET EVERY MORNING AND ONE-HALF TABLET EVERY AFTERNOON  . omeprazole (PRILOSEC) 40 MG capsule TAKE 1 CAPSULE DAILY BY MOUTH.  . Polyethylene Glycol 3350 (MIRALAX PO) Take by mouth daily as needed.   . progesterone (PROMETRIUM) 100 MG capsule TAKE 1 CAPSULE BY MOUTH EVERY DAY  AT BEDTIME  . VESICARE 5 MG tablet TAKE 1 TABLET (5 MG TOTAL) BY MOUTH DAILY.   No facility-administered encounter medications on file as of 12/06/2018.     Review of Systems:  Review of Systems  Constitutional: Positive for malaise/fatigue. Negative for chills and fever.  HENT: Negative for congestion.   Eyes: Negative for blurred vision.  Respiratory: Negative for cough and shortness of breath.   Cardiovascular: Negative for chest pain, palpitations and leg swelling.  Genitourinary: Negative for dysuria.  Musculoskeletal: Positive for back pain and joint pain. Negative for falls.  Skin: Negative for itching and rash.  Neurological: Positive for weakness. Negative for dizziness and loss of consciousness.  Endo/Heme/Allergies: Does not bruise/bleed easily.  Psychiatric/Behavioral:  Negative for depression and memory loss. The patient is not nervous/anxious and does not have insomnia.     Health Maintenance  Topic Date Due  . Hepatitis C Screening  02/02/1946  . DEXA SCAN  12/20/2012  . MAMMOGRAM  02/21/2017  . INFLUENZA VACCINE  02/19/2019  . PNA vac Low Risk Adult (2 of 2 - PPSV23) 08/31/2019  . TETANUS/TDAP  12/05/2019  . Fecal DNA (Cologuard)  09/28/2021    Physical Exam: Vitals:   12/06/18 1041  BP: 130/76  Pulse: 75  Temp: 98 F (36.7 C)  TempSrc: Oral  SpO2: 98%  Weight: 185 lb (83.9 kg)  Height: '5\' 2"'  (1.575 m)   Body mass index is 33.84 kg/m. Physical Exam Vitals signs reviewed.  Constitutional:      Appearance: Normal appearance.  HENT:     Head: Normocephalic and atraumatic.  Cardiovascular:     Rate and Rhythm: Normal rate and regular rhythm.     Pulses: Normal pulses.     Heart sounds: Normal heart sounds.  Pulmonary:     Effort: Pulmonary effort is normal.     Breath sounds: Normal breath sounds.  Abdominal:     General: Bowel sounds are normal.  Musculoskeletal: Normal range of motion.        General: No swelling or tenderness.     Comments: LE weakness, can only walk short distances  Skin:    General: Skin is warm and dry.  Neurological:     Mental Status: She is alert and oriented to person, place, and time.  Psychiatric:        Mood and Affect: Mood normal.        Behavior: Behavior normal.        Thought Content: Thought content normal.        Judgment: Judgment normal.     Labs reviewed: Basic Metabolic Panel: Recent Labs    03/09/18 1342 08/25/18 1215  NA 140 140  K 4.1 4.4  CL 107 102  CO2 26 20  GLUCOSE 88 84  BUN 20 19  CREATININE 0.75 0.71  CALCIUM 9.2 9.4  TSH  --  3.720   Liver Function Tests: Recent Labs    03/09/18 1342 08/25/18 1215  AST 20 22  ALT 22 25  ALKPHOS 119 126*  BILITOT 0.4 0.3  PROT 6.5 6.2  ALBUMIN 3.8 4.3   No results for input(s): LIPASE, AMYLASE in the last 8760  hours. No results for input(s): AMMONIA in the last 8760 hours. CBC: Recent Labs    03/09/18 1342 08/25/18 1215  WBC 4.9 4.1  NEUTROABS 3.9 2.8  HGB 13.6 13.9  HCT 41.3 41.4  MCV 89.5 89  PLT 171 216    Assessment/Plan 1. Essential hypertension -well  controlled by our readings, but her machine seems to be reading a good 30 pts higher than our manual bps - cont cozaar, avoid high sodium foods, cont PT exercise - Blood Pressure Monitor KIT; Check blood pressure twice daily  Dispense: 1 each; Refill: 0  2. MULTIPLE SCLEROSIS, PROGRESSIVE/RELAPSING - continue leflunomide as per Dr. Felecia Shelling, uses provigil for low energy/chronic fatigue  3. Chronic fatigue -continue provigil; due to MS  4. Slow transit constipation -cont flax seed, high fiber diet, hydration and occasional miralax when those don't work  5. Body mass index (BMI) of 33.0-33.9 in adult -ongoing issue due to MS and lumbar DDD preventing much aerobic exercise; working on improving her diet - Lipid panel; Future  6. Obesity (BMI 30.0-34.9) -counseled on health diet choices - Lipid panel; Future  Labs/tests ordered:   Recent Results (from the past 2160 hour(s))  Cologuard     Status: None   Collection Time: 09/29/18 12:00 AM  Result Value Ref Range   Cologuard Negative Negative   Next appt:  4 mos med mgt, fasting lipid before  Roslind Michaux L. Dylyn Mclaren, D.O. [facility] [address], [place] [address] Cell Phone (Mon-Fri 8am-5pm):  [phone] On Call:  [phone] & follow prompts after 5pm & weekends Office Phone:  [phone] Office Fax:  [phone]

## 2018-12-09 ENCOUNTER — Ambulatory Visit: Payer: No Typology Code available for payment source | Admitting: Internal Medicine

## 2018-12-23 ENCOUNTER — Other Ambulatory Visit: Payer: Self-pay | Admitting: Neurology

## 2018-12-23 MED ORDER — CLONAZEPAM 0.5 MG PO TABS: 0.5000 mg | ORAL_TABLET | Freq: Every day | ORAL | 1 refills | Status: DC

## 2018-12-23 NOTE — Telephone Encounter (Signed)
Pt is needing a refill on her clonazePAM (KLONOPIN) 0.5 MG tablet for a 90 day prescription sent to [facility] [address].

## 2018-12-23 NOTE — Telephone Encounter (Signed)
Checked drug registry. She last refilled 12/01/18 #30. Last seen 08/25/18 and next f/u 03/23/19

## 2018-12-23 NOTE — Telephone Encounter (Signed)
Per Dr. Felecia Shelling- ok to do 90 days supply

## 2019-01-20 ENCOUNTER — Other Ambulatory Visit: Payer: Self-pay | Admitting: Obstetrics and Gynecology

## 2019-01-20 DIAGNOSIS — Z1231 Encounter for screening mammogram for malignant neoplasm of breast: Secondary | ICD-10-CM

## 2019-01-22 ENCOUNTER — Other Ambulatory Visit: Payer: Self-pay | Admitting: Neurology

## 2019-01-22 DIAGNOSIS — I10 Essential (primary) hypertension: Secondary | ICD-10-CM

## 2019-01-24 DIAGNOSIS — Z124 Encounter for screening for malignant neoplasm of cervix: Secondary | ICD-10-CM | POA: Diagnosis not present

## 2019-01-24 DIAGNOSIS — Z79890 Hormone replacement therapy (postmenopausal): Secondary | ICD-10-CM | POA: Diagnosis not present

## 2019-01-24 DIAGNOSIS — Z01419 Encounter for gynecological examination (general) (routine) without abnormal findings: Secondary | ICD-10-CM | POA: Diagnosis not present

## 2019-01-25 DIAGNOSIS — G35 Multiple sclerosis: Secondary | ICD-10-CM | POA: Diagnosis not present

## 2019-01-25 DIAGNOSIS — H0288A Meibomian gland dysfunction right eye, upper and lower eyelids: Secondary | ICD-10-CM | POA: Diagnosis not present

## 2019-01-25 DIAGNOSIS — H0288B Meibomian gland dysfunction left eye, upper and lower eyelids: Secondary | ICD-10-CM | POA: Diagnosis not present

## 2019-01-27 ENCOUNTER — Other Ambulatory Visit: Payer: Self-pay | Admitting: Neurology

## 2019-03-03 ENCOUNTER — Other Ambulatory Visit: Payer: Self-pay

## 2019-03-03 ENCOUNTER — Ambulatory Visit
Admission: RE | Admit: 2019-03-03 | Discharge: 2019-03-03 | Disposition: A | Discharge status: Home / Self Care | Payer: Medicare Other | Source: Ambulatory Visit | Attending: Obstetrics and Gynecology | Admitting: Obstetrics and Gynecology

## 2019-03-03 DIAGNOSIS — Z1231 Encounter for screening mammogram for malignant neoplasm of breast: Secondary | ICD-10-CM

## 2019-03-11 ENCOUNTER — Other Ambulatory Visit: Payer: Medicare Other

## 2019-03-23 ENCOUNTER — Ambulatory Visit (INDEPENDENT_AMBULATORY_CARE_PROVIDER_SITE_OTHER): Payer: Medicare Other | Admitting: Neurology

## 2019-03-23 ENCOUNTER — Other Ambulatory Visit: Payer: Self-pay

## 2019-03-23 ENCOUNTER — Encounter: Payer: Self-pay | Admitting: Neurology

## 2019-03-23 VITALS — BP 167/79 | HR 65 | Temp 97.8°F | Ht 62.0 in | Wt 183.0 lb

## 2019-03-23 DIAGNOSIS — G35 Multiple sclerosis: Secondary | ICD-10-CM

## 2019-03-23 DIAGNOSIS — M5431 Sciatica, right side: Secondary | ICD-10-CM

## 2019-03-23 DIAGNOSIS — R269 Unspecified abnormalities of gait and mobility: Secondary | ICD-10-CM

## 2019-03-23 MED ORDER — MODAFINIL 200 MG PO TABS: ORAL_TABLET | ORAL | 1 refills | Status: DC

## 2019-03-23 NOTE — Progress Notes (Signed)
[facility]  PATIENT: Dawn [name] DOB: [birth year]  REFERRING CLINICIAN: Aura Dials HISTORY FROM: Patient REASON FOR VISIT: MS   HISTORICAL  CHIEF COMPLAINT:  Chief Complaint  Patient presents with   Follow-up    RM 12, alone. Last seen 08/25/2018   Multiple Sclerosis    On leflunomide. Doing well.    Medication Refill    Needs refil on modafinil (90days supply)    HISTORY OF PRESENT ILLNESS:  Dawn [name] is a 73 y.o. woman who was diagnosed with multiple sclerosis in 2001.      Update 03/23/2019: She feels her MS is stable and has no exacerbations.    She is on leflunomide and tolerated well.  She has more more pain in the hips and back.  She hasn't done PT or Tai Chi since Covid.   She hasn't got out of her house much since the pandemic.    She feels more down recently and is more fatigued.    She does not any close family in [place].   We discussed trying to get out of the house.     She continues to have some back pain.  Pain is worse on the right.   Pain increases when she walks and that limits her ability to walk more than a couple 100 yards  She is getting some ocular migraines.     She will note some scintillations in her visual field for 10 to 15 minutes.  These are not followed by headache.   Update 08/25/2018: She reports that her MS is a little worse and she notes some new symptoms.  She has not noted any exacerbation.  She is on leflunomide and tolerates it well.   She feels gait is mildly worse.   She is doing PT and does dry needling and stretches.  Pain was worse 3 weeks ago.   A prednisone pack helped.  She is worried about attention and focus are worse. Memory seems worse.    She has mor trouble with coming up with the right words.    She feels her BP is high and sometimes the heart is racing.   She felt these symptoms started with starting Aubagio and continued with switch to Leflunomide.    BP has been elevated at times and is  175/90 today.      MGUS was considered based on a probable IgG lambda but this was not felt to be relevant.   Follow-up labwork showed a normal IEF   Update 02/19/2018: Her MS is doing well.   She is on Leflunomide and she tolerates it well.  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    She feels she has slowly progressed over the last couple years with gait.    Her memory is also worse.    She tries to keep herself mentally active.    She notes reduced STM and reduced focus/attentipon.   She left the stove on once.    Usually, when she forgets, she will remember later or remember with a hint.       She is doing PT for her lower back pain and feels it is doing well.    She is doing dry needling with benefit.    She is on etodolac but is no longer on gabapentin and tramadol.     The bursitis pain has returned.    She gets ome benefit from the trochanteric bursa injections she  had in the past.      She continues to have some generalized joint pain though the hips are much more painful than elsewhere.  Due to her back and hip pain she saw orthopedics.  An MRI of the lumbar spine was performed showing a focus in the L1 vertebral body.  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   She was referred to hematology but the appointment was made in [place] and she was unable to go there and she has not followed up.       Update 10/20/2017: She is reporting more pain in the hips, left > right.  Pain is worse when she is laying on her sides or if she stands or walks a while.  She received benefit from trochanteric bursae injections in the past.   We discussed her seeing Ortho.     The right elbow pain is much batter after a shot in the past.    Her MS is doing well.  She is on leflunomide (she had issues getting patient assistance for Aubagio).  She is tolerating it well.  She has not had any exacerbations since starting.  She feels her MS is stable.  She  notes no new issues with her gait, strength or sensation.  Balance is still off at times.  She has some urinary frequency and urgency but no incontinence.   She stopped Myrbetriq and went back to [place] because she had a mild rash.    Update 08/12/2017: She saw Rheumatology Dawn [name]) and they feel that she does not have RA or Sjogren's.    She switched form Aubagio to Leflunomide due to insurance issues and very high copay.    She notes that she feels better overall and copay is only $8.   However, she notes that her mouth is dryer and throat is scratchy much of the time.   She also notes mild dry eyes.   She has not seen ophthalmology in several years.      She denies new issues with gait, strength or sensation.    No change in bladder with frequency and nocturia (twice mostly).    She notes less worry and stress since Aubagio was switched to Leflunomide as she was going to have very high copays.     She feels better with less dysesthesia since switching to leflunomide   She has bilateral hip pain again.  Pain worsens with lying on her side or standing a while. In the past, trochanteric bursae injections were beneficial  Update 04/14/2017: She is on Aubagio but is just about out.   [employer] is not able to help her get Aubagio without a very high copay.  She is needing to call the "charity" daily to see if new funds are available.   We discussed trying Leflunomide which is a precursor of teriflunomide and used for rheumatoid arthritis.    I have used this for other patients with the same problems getting medication.     For the most part, her MS is stable but she is noting more burning dysesthesia in her feet.   Aspercreme helps Tramadol was not well tolerated.   Etodolac helps the arthritic pain some.   Her elbow pain improved with an injection.    A piriformis injection only helped a week.   She has myalgias throughout her body.     She has had dark urine a few times  but it only lasted one  day.    Bladder function is unchanged and Vesicare helps the frequency and urgency.  .   Vision sometimes seems blurry but this fluctuates.     Fatigue bothers her daily.   Provigil helps some but incompletely.  Mood has been worse since she found out she wasn't going to be getting copay assistance for Aubagio ________________________________________________ From 02/18/2017:  Pain:   She is experiencing bilateral hip pain and more pain in her hands and feet.     We have done trochanteric bursae injections and bilateral piriformis injections seemed to help the pain for a longer time and pelvic pain is also better.  She has  A labrum tear on the right and has seen ortho.     She also has elbow pain on the right, helped x 2 months by steroid injection at the last visit.   Tramadol and etodolac helps some but not long term.   All the joint pain worsens with use and improves with a heating pad.   She has not been tested for inflammatory arthritis.   Other joints also hurt much of the time.  MS:   She is on Aubagio and she tolerates it well. She has occasional diarrhea.   She could not tolerate Tecfidera and had tolerability issues with Plegridy and Betaseron earlier.   She is not having any definite exacerbation though she has some sensory fluctuations.   The MRI performed January 2018 showed chronic MS lesions but no acute findings.   She also appears to have had a left cerebellar CVA (also chronic)  Vision:   She notes mild blurred vision that is intermittent.    She feels both eyes are involved.   Once occurred while driving and once while watching TV.    She did not feel symptoms were just from one eye.     Gait/strength/sensation:     She uses a cane for longer distances and if tired but walks without most of the time.  She uses it more when hips act up also.       She notes more muscle fatigue.    She has dysesthesias mostly in the left leg that did not respond well to the medications.     She denies  much weakness but has muscle stiffness and pain.   Bladder:   She has urinary frequency and hesitancy.  She is on tamsulosin qod.  Vesicare helped but seemed to help her more last year that this year.    She has seen [facility].  She has a little diarrhea at times.    Fatigue/sleep:   She has fatigue that is worse in the afternoons.    She is back on modafinil with benefit. She has some difficulty with sleep onset insomnia > sleep maintenance, mostly due to pain she thinks.   Vesicare has helped sleep with less nocturia.    She has not been told she snores.    Mood/cognition:    Mood is generslly gfood, occ down with her pain.   She gets frustrated easily.  She is now doing an exercise class out in the community (does sitting down --- it a an arthritis group)..   Cognitively, she has difficulty with short-term memory as well as with verbal fluency  MS History:    In 1994, she had an episode of severe vertigo with gait ataxia. An MRI at that time was reportedly normal. The symptoms were felt to be  due to allergies. About 7 years later she had vertigo, gait ataxia and diplopia. Also her left leg was giving out. Short after that she had a hysterectomy. Postoperatively, she was numb in both legs and she had a lumbar puncture which showed changes consistent with MS. Then, an MRI of the brain was performed showing changes consistent with MS. She was referred to Dr. Erling Cruz who her on Betaseron. Last year, she switched from Betaseron to Tecfidera, in the hope that she would be able to avoid shots as she was having severe skin reactions with knots.    However, she had a lot of difficulty tolerating Tecfidera and swithced to Rayle in early 2016 and to Aubagio late 2016    REVIEW OF SYSTEMS:  Constitutional: No fevers, chills, sweats, or change in appetite.   Reports fatigue Eyes: No visual changes, double vision, eye pain Ear, nose and throat: No hearing loss, ear pain, nasal congestion, sore  throat Cardiovascular: No chest pain, palpitations Respiratory:  No shortness of breath at rest or with exertion.   No wheezes GastrointestinaI: No nausea, vomiting, diarrhea, abdominal pain, fecal incontinence Genitourinary:  see above. Musculoskeletal:  Pain in left greater than right hip Integumentary: No rash, pruritus.    Neurological: as above Psychiatric: No depression at this time.  No anxiety Endocrine: No palpitations, diaphoresis, change in appetite, change in weigh or increased thirst Hematologic/Lymphatic:  No anemia, purpura, petechiae. Allergic/Immunologic: No itchy/runny eyes, nasal congestion, recent allergic reactions, rashes  ALLERGIES: No Known Allergies  HOME MEDICATIONS: Outpatient Medications Prior to Visit  Medication Sig Dispense Refill   aspirin EC 81 MG tablet Take 81 mg by mouth daily.     Blood Pressure Monitor KIT Check blood pressure twice daily 1 each 0   calcium carbonate (OS-CAL) 600 MG TABS Take 600 mg by mouth daily.     clonazePAM (KLONOPIN) 0.5 MG tablet Take 1 tablet (0.5 mg total) by mouth at bedtime. 90 tablet 1   estradiol (CLIMARA - DOSED IN MG/24 HR) 0.0375 mg/24hr patch Place 1 patch onto the skin once a week.   11   fexofenadine (ALLEGRA) 180 MG tablet Take 180 mg by mouth daily.     leflunomide (ARAVA) 20 MG tablet TAKE ONE TABLET BY MOUTH DAILY 90 tablet 4   losartan (COZAAR) 25 MG tablet TAKE ONE TABLET BY MOUTH DAILY 90 tablet 1   Misc Natural Products (SUPER-D3+) 5000 units CAPS Take by mouth daily.     omeprazole (PRILOSEC) 40 MG capsule TAKE 1 CAPSULE DAILY BY MOUTH. 90 capsule 3   Polyethylene Glycol 3350 (MIRALAX PO) Take by mouth daily as needed.      progesterone (PROMETRIUM) 100 MG capsule TAKE 1 CAPSULE BY MOUTH EVERY DAY AT BEDTIME  3   solifenacin (VESICARE) 5 MG tablet TAKE ONE TABLET BY MOUTH DAILY 90 tablet 1   modafinil (PROVIGIL) 200 MG tablet TAKE 1 TABLET EVERY MORNING AND ONE-HALF TABLET EVERY AFTERNOON  135 tablet 0   No facility-administered medications prior to visit.     PAST MEDICAL HISTORY: Past Medical History:  Diagnosis Date   Allergy    Essential hypertension 08/25/2018   Hot flashes, menopausal 10/13/2011   Estradiol started    Multiple sclerosis ([place])    Neuropathy    Osteoarthritis    Vision abnormalities     PAST SURGICAL HISTORY: Past Surgical History:  Procedure Laterality Date   ABDOMINAL HYSTERECTOMY  2002   partial   LUMBAR FUSION  FAMILY HISTORY: Family History  Problem Relation Age of Onset   Heart failure Mother    Heart failure Father    Arthritis Other    Hypertension Other     SOCIAL HISTORY:  Social History   Socioeconomic History   Marital status: Widowed    Spouse name: Not on file   Number of children: Not on file   Years of education: Not on file   Highest education level: Not on file  Occupational History   Occupation: retired  Scientist, product/process development strain: Not on file   Food insecurity    Worry: Not on file    Inability: Not on Lexicographer needs    Medical: Not on file    Non-medical: Not on file  Tobacco Use   Smoking status: Former Smoker    Packs/day: 0.50    Years: 15.00    Pack years: 7.50    Types: Cigarettes    Quit date: 08/05/1983    Years since quitting: 35.6   Smokeless tobacco: Never Used  Substance and Sexual Activity   Alcohol use: Yes    Alcohol/week: 0.0 standard drinks    Comment: rarely   Drug use: No   Sexual activity: Not Currently    Birth control/protection: Post-menopausal  Lifestyle   Physical activity    Days per week: Not on file    Minutes per session: Not on file   Stress: Not on file  Relationships   Social connections    Talks on phone: Not on file    Gets together: Not on file    Attends religious service: Not on file    Active member of club or organization: Not on file    Attends meetings of clubs or organizations: Not on  file    Relationship status: Not on file   Intimate partner violence    Fear of current or ex partner: Not on file    Emotionally abused: Not on file    Physically abused: Not on file    Forced sexual activity: Not on file  Other Topics Concern   Not on file  Social History Narrative   Regular Exercise-no   Widowed   Retired   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     Tobacco use, amount per day now: former   Past tobacco use, amount per day: 1/2-1 packet   How many years did you use tobacco: 12 years   Alcohol use (drinks per week): 0   Diet: mediterranean based   Do you drink/eat things with caffeine: yes   Marital status:        widow                          What year were you married? 1987   Do you live in a house, apartment, assisted living, condo, trailer, etc.? house   Is it one or more stories? 1 story   How many persons live in your home? 1   Do you have pets in your home?( please list) no   Current or past profession: Science writer    Do you exercise?          yes  Type and how often? Tai chi and pt therapy, stretches etc....   Do you have a living will? yes   Do you have a DNR form?      no                             If not, do you want to discuss one? yes   Do you have signed POA/HPOA forms?      no                  If so, please bring to you appointment     PHYSICAL EXAM  Vitals:   03/23/19 0950  BP: (!) 167/79  Pulse: 65  Temp: 97.8 F (36.6 C)  Weight: 183 lb (83 kg)  Height: 5' 2" (1.575 m)    Body mass index is 33.47 kg/m.   General: The patient is well-developed and well-nourished and in no acute distress  Musculoskeletal: She has mild trochanteric bursa tenderness, bilaterally.  Mild lower lumbar paraspinal tenderness.  Neurologic Exam  Mental  status: The patient is alert and oriented x 3 at the time of the examination. The patient has apparent normal recent and remote memory, with an apparently normal attention span and concentration ability.   Speech is normal.  Cranial nerves: Extraocular movements are full.  Facial strength and sensory are normal.  Hearing appeared to be symmetric  Motor:  Muscle bulk is normal but tone is symmetric, more on the left.. Strength is 4+/5 in intrinsic hand muscles   Sensory: Sensory testing is intact to touch and vibration in the arms.  She has reduced vibration sensation in the feet.  Coordination: Finger-nose-finger and heel-to-shin is performed better on the right than the left.  Gait and station: Station is normal.  The gait is mildly wide and arthritic.  Tandem gait is wide.  Romberg is negative.    Reflexes: Deep tendon reflexes are symmetric and normal bilaterally.      DIAGNOSTIC DATA (LABS, IMAGING, TESTING) - I reviewed patient records, labs, notes, testing and imaging myself where available.  Lab Results  Component Value Date   WBC 4.1 08/25/2018   HGB 13.9 08/25/2018   HCT 41.4 08/25/2018   MCV 89 08/25/2018   PLT 216 08/25/2018      Component Value Date/Time   NA 140 08/25/2018 1215   K 4.4 08/25/2018 1215   CL 102 08/25/2018 1215   CO2 20 08/25/2018 1215   GLUCOSE 84 08/25/2018 1215   GLUCOSE 88 03/09/2018 1342   BUN 19 08/25/2018 1215   CREATININE 0.71 08/25/2018 1215   CREATININE 0.75 03/09/2018 1342   CALCIUM 9.4 08/25/2018 1215   PROT 6.2 08/25/2018 1215   ALBUMIN 4.3 08/25/2018 1215   AST 22 08/25/2018 1215   AST 20 03/09/2018 1342   ALT 25 08/25/2018 1215   ALT 22 03/09/2018 1342   ALKPHOS 126 (H) 08/25/2018 1215   BILITOT 0.3 08/25/2018 1215   BILITOT 0.4 03/09/2018 1342   GFRNONAA 87 08/25/2018 1215   GFRNONAA >60 03/09/2018 1342   GFRAA 100 08/25/2018 1215   GFRAA >60 03/09/2018 1342   Lab Results  Component Value Date   CHOL 203 (H) 11/03/2011    HDL 44.60 11/03/2011   LDLCALC 136 (H) 12/05/2009   LDLDIRECT 130.2 11/03/2011   TRIG 185.0 (H) 11/03/2011   CHOLHDL 5 11/03/2011   No results found for: HGBA1C No results found for: VITAMINB12 Lab  Results  Component Value Date   TSH 3.720 08/25/2018       ASSESSMENT AND PLAN  MULTIPLE SCLEROSIS, PROGRESSIVE/RELAPSING - Plan: Ambulatory referral to Physical Therapy, CBC with Differential/Platelet, Comprehensive metabolic panel  Sciatica, right side - Plan: Ambulatory referral to Physical Therapy, DG INJECT DIAG/THERA/INC NEEDLE/CATH/PLC EPI/LUMB/SAC W/IMG  Abnormality of gait - Plan: Ambulatory referral to Physical Therapy   1.    She will continue leflunomide for MS.   Check labs.   2.    Refer to PT for gait and pain issues 3.    Stay active and exercise as tolerated.   4.    Referral for L3-L4 epidural steroid injection. 5.      Return to see me in about 6 months but is advised to call sooner if she has new or worsening neurologic symptoms.    Annajulia Lewing A. Felecia Shelling, MD, PhD 4/0/9811, 9:14 PM Certified in Neurology, Clinical Neurophysiology, Sleep Medicine, Pain Medicine and Neuroimaging  [facility] [address], [place], [place] [address] [phone]-' \

## 2019-03-24 ENCOUNTER — Telehealth: Payer: Self-pay | Admitting: *Deleted

## 2019-03-24 LAB — CBC WITH DIFFERENTIAL/PLATELET
Basophils Absolute: 0.1 10*3/uL (ref 0.0–0.2)
Basos: 1 %
EOS (ABSOLUTE): 0.2 10*3/uL (ref 0.0–0.4)
Eos: 3 %
Hematocrit: 41.1 % (ref 34.0–46.6)
Hemoglobin: 13.5 g/dL (ref 11.1–15.9)
Immature Grans (Abs): 0 10*3/uL (ref 0.0–0.1)
Immature Granulocytes: 0 %
Lymphocytes Absolute: 0.7 10*3/uL (ref 0.7–3.1)
Lymphs: 13 %
MCH: 29.4 pg (ref 26.6–33.0)
MCHC: 32.8 g/dL (ref 31.5–35.7)
MCV: 90 fL (ref 79–97)
Monocytes Absolute: 0.5 10*3/uL (ref 0.1–0.9)
Monocytes: 9 %
Neutrophils Absolute: 3.8 10*3/uL (ref 1.4–7.0)
Neutrophils: 74 %
Platelets: 221 10*3/uL (ref 150–450)
RBC: 4.59 x10E6/uL (ref 3.77–5.28)
RDW: 14 % (ref 11.7–15.4)
WBC: 5.2 10*3/uL (ref 3.4–10.8)

## 2019-03-24 LAB — COMPREHENSIVE METABOLIC PANEL
ALT: 18 IU/L (ref 0–32)
AST: 19 IU/L (ref 0–40)
Albumin/Globulin Ratio: 2.3 — ABNORMAL HIGH (ref 1.2–2.2)
Albumin: 4.2 g/dL (ref 3.7–4.7)
Alkaline Phosphatase: 134 IU/L — ABNORMAL HIGH (ref 39–117)
BUN/Creatinine Ratio: 21 (ref 12–28)
BUN: 14 mg/dL (ref 8–27)
Bilirubin Total: 0.3 mg/dL (ref 0.0–1.2)
CO2: 22 mmol/L (ref 20–29)
Calcium: 9.8 mg/dL (ref 8.7–10.3)
Chloride: 103 mmol/L (ref 96–106)
Creatinine, Ser: 0.67 mg/dL (ref 0.57–1.00)
GFR calc Af Amer: 102 mL/min/{1.73_m2} (ref >59)
GFR calc non Af Amer: 89 mL/min/{1.73_m2} (ref >59)
Globulin, Total: 1.8 g/dL (ref 1.5–4.5)
Glucose: 77 mg/dL (ref 65–99)
Potassium: 4.6 mmol/L (ref 3.5–5.2)
Sodium: 139 mmol/L (ref 134–144)
Total Protein: 6 g/dL (ref 6.0–8.5)

## 2019-03-24 NOTE — Telephone Encounter (Signed)
Called, LVM for pt to call about results.  Ok to relay labs were fine per Dr. Felecia Shelling if she calls back

## 2019-03-24 NOTE — Telephone Encounter (Signed)
Pt has called and the results were relayed to her.  No call back requested

## 2019-03-24 NOTE — Telephone Encounter (Signed)
-----   Message from Britt Bottom, MD sent at 03/24/2019  2:15 PM EDT ----- Please let the patient know that the lab work is fine.

## 2019-03-24 NOTE — Telephone Encounter (Signed)
Noted  

## 2019-03-30 ENCOUNTER — Ambulatory Visit
Admission: RE | Admit: 2019-03-30 | Discharge: 2019-03-30 | Disposition: A | Discharge status: Home / Self Care | Payer: Medicare Other | Source: Ambulatory Visit | Attending: Neurology | Admitting: Neurology

## 2019-03-30 ENCOUNTER — Other Ambulatory Visit: Payer: Medicare Other

## 2019-03-30 DIAGNOSIS — G8929 Other chronic pain: Secondary | ICD-10-CM | POA: Diagnosis not present

## 2019-03-30 DIAGNOSIS — M545 Low back pain: Secondary | ICD-10-CM | POA: Diagnosis not present

## 2019-03-30 DIAGNOSIS — M5431 Sciatica, right side: Secondary | ICD-10-CM

## 2019-03-30 MED ORDER — IOPAMIDOL (ISOVUE-M 200) INJECTION 41%
1.0000 mL | Freq: Once | INTRAMUSCULAR | Status: AC
  Administered 2019-03-30: 1 mL via EPIDURAL

## 2019-03-30 MED ORDER — METHYLPREDNISOLONE ACETATE 40 MG/ML INJ SUSP (RADIOLOG
120.0000 mg | Freq: Once | INTRAMUSCULAR | Status: AC
  Administered 2019-03-30: 12:00:00 120 mg via EPIDURAL

## 2019-03-30 NOTE — Discharge Instructions (Signed)

## 2019-04-01 ENCOUNTER — Ambulatory Visit (INDEPENDENT_AMBULATORY_CARE_PROVIDER_SITE_OTHER): Payer: Medicare Other

## 2019-04-01 ENCOUNTER — Other Ambulatory Visit: Payer: Medicare Other

## 2019-04-01 ENCOUNTER — Other Ambulatory Visit: Payer: Self-pay

## 2019-04-01 DIAGNOSIS — Z23 Encounter for immunization: Secondary | ICD-10-CM | POA: Diagnosis not present

## 2019-04-17 ENCOUNTER — Other Ambulatory Visit: Payer: Self-pay | Admitting: Neurology

## 2019-04-26 ENCOUNTER — Other Ambulatory Visit: Payer: Self-pay

## 2019-04-26 ENCOUNTER — Ambulatory Visit: Payer: Medicare Other | Attending: Neurology | Admitting: Physical Therapy

## 2019-04-26 DIAGNOSIS — G8929 Other chronic pain: Secondary | ICD-10-CM | POA: Diagnosis not present

## 2019-04-26 DIAGNOSIS — R262 Difficulty in walking, not elsewhere classified: Secondary | ICD-10-CM | POA: Insufficient documentation

## 2019-04-26 DIAGNOSIS — M6281 Muscle weakness (generalized): Secondary | ICD-10-CM

## 2019-04-26 DIAGNOSIS — M545 Low back pain: Secondary | ICD-10-CM | POA: Diagnosis not present

## 2019-04-26 DIAGNOSIS — R252 Cramp and spasm: Secondary | ICD-10-CM | POA: Insufficient documentation

## 2019-04-26 NOTE — Therapy (Signed)
[place], [place], [address] Phone: [phone]   Fax:  [phone]  Physical Therapy Evaluation  Patient Details  Name: Dawn [name] MRN: [id number] Date of Birth: April 13, 1946 Referring Provider (PT): Dr Christell Constant   Encounter Date: 04/26/2019  PT End of Session - 04/26/19 0946    Visit Number  1    Number of Visits  12    Date for PT Re-Evaluation  06/07/19    Authorization Type  Medicare Kx Modifier on visit 13 proress not on 10th visit    PT Start Time  0925    PT Stop Time  1013    PT Time Calculation (min)  48 min    Activity Tolerance  Patient tolerated treatment well    Behavior During Therapy  [facility] for tasks assessed/performed       Past Medical History:  Diagnosis Date  . Allergy   . Essential hypertension 08/25/2018  . Hot flashes, menopausal 10/13/2011   Estradiol started   . Multiple sclerosis ([place])   . Neuropathy   . Osteoarthritis   . Vision abnormalities     Past Surgical History:  Procedure Laterality Date  . ABDOMINAL HYSTERECTOMY  2002   partial  . LUMBAR FUSION      There were no vitals filed for this visit.   Subjective Assessment - 04/26/19 0930    Subjective  Patient has had and increase in lower back pain in March. She was unable to come into therapy  2nd to Covid. She was unable to do her exercises because of pain. She had an epidural 3 weeks ago. The benefits only lasted a few days. The pain is radiating form her lower back and into her groin    Pertinent History  MS    How long can you stand comfortably?  hurts as soon as she stands. Has to shift frequently    How long can you walk comfortably?  limited community ambualtion    Diagnostic tests  Nothign recent    Currently in Pain?  Yes    Pain Score  5     Pain Location  Hip    Pain Orientation  Right    Pain Descriptors / Indicators  Aching    Pain Type  Chronic pain    Pain Radiating Towards  into the  right hip    Pain Onset  More than a month ago    Pain Frequency  Constant    Aggravating Factors   standing, walking    Pain Relieving Factors  Heating pad;  tlyenol    Effect of Pain on Daily Activities  difficulty standing to do ADL's         [facility] PT Assessment - 04/26/19 0001      Assessment   Medical Diagnosis  Right Sided Low Back Pain     Referring Provider (PT)  Dr Christell Constant    Onset Date/Surgical Date  --   March 2020    Hand Dominance  Right    Next MD Visit  March 3rd 2021    Prior Therapy  Was seen at the end of 2019       Precautions   Precautions  None      Restrictions   Weight Bearing Restrictions  No      Balance Screen   Has the patient fallen in the past 6 months  No    Has the patient had a decrease in  activity level because of a fear of falling?   No    Is the patient reluctant to leave their home because of a fear of falling?   No      Home Environment   Additional Comments  3 steps in. Able to do with her back       Prior Function   Level of Independence  Independent    Vocation  On disability      Cognition   Overall Cognitive Status  Within Functional Limits for tasks assessed    Attention  Focused    Focused Attention  Appears intact    Memory  Appears intact    Awareness  Appears intact    Problem Solving  Appears intact      Observation/Other Assessments   Focus on Therapeutic Outcomes (FOTO)   58% limitation 50 % expected      Sensation   Additional Comments  denies parathesias in legs but bilateral feewt are numb      Coordination   Gross Motor Movements are Fluid and Coordinated  Yes    Fine Motor Movements are Fluid and Coordinated  Yes      Posture/Postural Control   Posture Comments  rounded shoulders and forward head       ROM / Strength   AROM / PROM / Strength  AROM;PROM;Strength      AROM   AROM Assessment Site  Lumbar    Lumbar Flexion  65    Lumbar Extension  15     Lumbar - Right Side Bend  painful      Lumbar - Left Side Bend  pain full     Lumbar - Right Rotation  full without pain     Lumbar - Left Rotation  full without pain       Strength   Strength Assessment Site  Hip;Knee    Right/Left Hip  Right;Left    Right Hip Flexion  3/5    Right Hip ABduction  3/5    Right Hip ADduction  3+/5    Left Hip Flexion  3+/5    Left Hip ABduction  3+/5    Left Hip ADduction  3+/5    Right/Left Knee  Right;Left    Right Knee Flexion  4/5    Right Knee Extension  3+/5    Left Knee Flexion  4/5    Left Knee Extension  3+/5      Palpation   Palpation comment  spasming of bilateral parapsinals R > L       Special Tests   Other special tests  SLR (-) bilateral       Ambulation/Gait   Gait Comments  decreased bilaterl hip flexion; decreased single leg stance time on the right                Objective measurements completed on examination: See above findings.      [facility] Adult PT Treatment/Exercise - 04/26/19 0001      Lumbar Exercises: Stretches   Active Hamstring Stretch Limitations  reviewd seated 3x20 sec hold each leg     Lower Trunk Rotation Limitations  x10 with cuing for technique     Other Lumbar Stretch Exercise  reviewed tennis ball trigger point release       Lumbar Exercises: Supine   AB Set Limitations  reviewed abdomianl breathing              PT Education - 04/26/19 0945  Education Details  reviewed HEp and symptom mangement    Person(s) Educated  Patient    Methods  Explanation;Verbal cues;Tactile cues;Demonstration    Comprehension  Verbalized understanding;Returned demonstration;Tactile cues required;Verbal cues required       PT Short Term Goals - 04/26/19 1036      PT SHORT TERM GOAL #1   Title  Patient will report no pain radiating into her hip and anterior thigh    Time  4    Period  Weeks    Status  New    Target Date  05/24/19      PT SHORT TERM GOAL #2   Title  Patient will increase bilateral LE strength to 4/5    Time  4     Period  Weeks    Target Date  05/24/19      PT SHORT TERM GOAL #3   Title  Patient will increase passive right hip flexion to 105 degrrees    Time  4    Period  Weeks    Status  New    Target Date  05/24/19        PT Long Term Goals - 04/26/19 1038      PT LONG TERM GOAL #1   Title  Patient will stand for 30 min without self reported increase pain in order to stand at a kitchen sink.    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 She has increased back pain with side bending, standing, and walking. She has weakness in bilateral LE R > L. She has sspasming in her paraspinals and upper gluteals R>L. She has been trewated with exercises and trigger point dry needling in the past and had success.    Personal Factors and Comorbidities  Comorbidity 1;Comorbidity 2    Comorbidities  MS, OA    Examination-Activity Limitations  Locomotion Level;Carry;Stand;Lift;Stairs;Squat    Examination-Participation Restrictions  Meal Prep;Cleaning;Community Activity;Laundry;Shop    Stability/Clinical Decision Making  Evolving/Moderate complexity    Clinical Decision Making  Moderate    Rehab Potential  Good    PT Frequency  2x / week    PT Duration  8 weeks    PT Treatment/Interventions  ADLs/Self Care Home Management;Electrical Stimulation;Cryotherapy;Iontophoresis 4mg /ml Dexamethasone;Moist Heat;Traction;DME Instruction;Neuromuscular  re-education;Patient/family education;Manual techniques;Passive range of motion;Taping    PT Next Visit Plan  manual therapy to improve hip ROM; begin light core strengthening; consider supine postural exercises with breathing; consider TPDN    PT Home Exercise Plan  LTR; hamstring stretch, tennis ball trigger point release ; abdominal breathing  Consulted and Agree with Plan of Care  Patient       Patient will benefit from skilled therapeutic intervention in order to improve the following deficits and impairments:  Abnormal gait, Difficulty walking, Decreased range of motion, Decreased mobility, Decreased strength, Postural dysfunction, Pain, Increased muscle spasms  Visit Diagnosis: Chronic bilateral low back pain without sciatica  Muscle weakness (generalized)  Cramp and spasm  Difficulty in walking, not elsewhere classified     Problem List Patient Active Problem List   Diagnosis Date Noted  . Essential hypertension 08/25/2018  . Abnormal SPEP 02/19/2018  . Hand pain 02/20/2017  . Multiple joint pain 02/18/2017  . Right elbow pain 12/09/2016  . Disturbed cognition 06/25/2016  . Trochanteric bursitis of left hip 02/18/2016  . Sciatica, right side 10/30/2015  . Bilateral arm pain 10/10/2015  . Trochanteric bursitis of right hip 08/04/2014  . Adjustment disorder with mixed anxiety and depressed mood 08/04/2014  . Chronic fatigue 08/04/2014  . Urinary frequency 08/04/2014  . Abnormality of gait 08/04/2014  . Unspecified visual disturbance 01/31/2013  . Transient vision disturbance 01/31/2013  . Routine general medical examination at a health care facility 11/04/2011  . Colon polyps 11/03/2011  . Hot flashes, menopausal 10/13/2011  . POSTHERPETIC NEURALGIA 10/03/2009  . DISPLCMT LUMBAR INTERVERT [place] W/O MYELOPATHY 09/05/2009  . RENAL CALCULUS, RECURRENT 09/04/2009  . HYPERLIPIDEMIA 08/31/2009  . MULTIPLE SCLEROSIS, PROGRESSIVE/RELAPSING 08/31/2009  . ALLERGIC  RHINITIS 08/31/2009    Carney Living PT DPT  04/26/2019, 4:46 PM  [facility] [address] [place], [place], [address] Phone: [phone]   Fax:  [phone]  Name: MICKAELA WOHLWEND MRN: [id number] Date of Birth: March 25, 1946

## 2019-05-03 ENCOUNTER — Encounter: Payer: Self-pay | Admitting: Physical Therapy

## 2019-05-03 ENCOUNTER — Other Ambulatory Visit: Payer: Self-pay

## 2019-05-03 ENCOUNTER — Ambulatory Visit: Payer: Medicare Other | Admitting: Physical Therapy

## 2019-05-03 DIAGNOSIS — R252 Cramp and spasm: Secondary | ICD-10-CM

## 2019-05-03 DIAGNOSIS — M545 Low back pain: Secondary | ICD-10-CM | POA: Diagnosis not present

## 2019-05-03 DIAGNOSIS — G8929 Other chronic pain: Secondary | ICD-10-CM | POA: Diagnosis not present

## 2019-05-03 DIAGNOSIS — M6281 Muscle weakness (generalized): Secondary | ICD-10-CM

## 2019-05-03 DIAGNOSIS — R262 Difficulty in walking, not elsewhere classified: Secondary | ICD-10-CM | POA: Diagnosis not present

## 2019-05-03 NOTE — Therapy (Addendum)
[place], [place], [address] Phone: [phone]   Fax:  [phone]  Physical Therapy Treatment  Patient Details  Name: Dawn [name] MRN: [id number] Date of Birth: 06/04/1946 Referring Provider (PT): Dr Christell Constant   Encounter Date: 05/03/2019  PT End of Session - 05/03/19 0811    Visit Number  2    Number of Visits  12    Date for PT Re-Evaluation  06/21/19    Authorization Type  Medicare Kx Modifier on visit 13 proress not on 10th visit    PT Start Time  0800    PT Stop Time  0841    PT Time Calculation (min)  41 min    Activity Tolerance  Patient tolerated treatment well    Behavior During Therapy  [facility] for tasks assessed/performed       Past Medical History:  Diagnosis Date  . Allergy   . Essential hypertension 08/25/2018  . Hot flashes, menopausal 10/13/2011   Estradiol started   . Multiple sclerosis ([place])   . Neuropathy   . Osteoarthritis   . Vision abnormalities     Past Surgical History:  Procedure Laterality Date  . ABDOMINAL HYSTERECTOMY  2002   partial  . LUMBAR FUSION      There were no vitals filed for this visit.  Subjective Assessment - 05/03/19 0806    Subjective  Patient is feeling better. She feels like she is still having pain in he hips but her right side is about the same as her left. It is ussualy better in the morning. NMost of the time it is activity dependent.    Pertinent History  MS    How long can you stand comfortably?  hurts as soon as she stands. Has to shift frequently    How long can you walk comfortably?  limited community ambualtion    Diagnostic tests  Nothign recent    Currently in Pain?  Yes    Pain Score  4     Pain Location  Hip    Pain Orientation  Right    Pain Descriptors / Indicators  Aching    Pain Type  Chronic pain    Pain Onset  More than a month ago    Pain Frequency  Constant    Aggravating Factors   standing and walking    Pain  Relieving Factors  heating pad    Effect of Pain on Daily Activities  difficulty standing                       [facility] Adult PT Treatment/Exercise - 05/03/19 0001      Lumbar Exercises: Supine   AB Set Limitations  reviewed abdomianl breathing     Other Supine Lumbar Exercises  ball squeeze 2x10 with ab breathing     Other Supine Lumbar Exercises  hip abduction 2x10 red with cuing for abdominal breathing       Manual Therapy   Manual Therapy  Joint mobilization;Soft tissue mobilization    Manual therapy comments  skilled palpation of trigger points     Joint Mobilization  PA glides form L2 to L5     Soft tissue mobilization  to lumbar spine and upper gluteals bilateral        Trigger Point Dry Needling - 05/03/19 0001    Consent Given?  Yes    Education Handout Provided  Yes    Muscles Treated Back/Hip  Gluteus medius;Lumbar multifidi    Gluteus Medius Response  Twitch response elicited    Lumbar multifidi Response  Twitch response elicited           PT Education - 05/03/19 0809    Education Details  reviewed HEp and symptom mangement    Person(s) Educated  Patient    Methods  Explanation;Demonstration;Tactile cues;Verbal cues    Comprehension  Verbalized understanding;Returned demonstration;Verbal cues required;Tactile cues required       PT Short Term Goals - 05/03/19 1322      PT SHORT TERM GOAL #1   Title  Patient will report no pain radiating into her hip and anterior thigh    Baseline  105    Time  4    Period  Weeks    Status  On-going      PT SHORT TERM GOAL #2   Title  Patient will increase bilateral LE strength to 4/5    Baseline  4+/5 hip flexion 4+/5 hip abduction     Time  4    Status  On-going      PT SHORT TERM GOAL #3   Title  Patient will increase passive right hip flexion to 105 degrrees    Baseline  independnet with exercises given to her at this time     Time  4    Period  Weeks    Status  On-going    Target Date   05/24/19        PT Long Term Goals - 04/26/19 1038      PT LONG TERM GOAL #1   Title  Patient will stand for 30 min without self reported increase pain in order to stand at a kitchen sink.    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 She tolerated ther-ex well. Therapy had improved pain with dry needling. She had a good twtich response. She reported improved pain with manual therapy as well.    Personal Factors and Comorbidities  Comorbidity 1;Comorbidity 2    Comorbidities  MS, OA    Examination-Activity Limitations  Locomotion Level;Carry;Stand;Lift;Stairs;Squat    Examination-Participation Restrictions  Meal Prep;Cleaning;Community Activity;Laundry;Shop    Stability/Clinical Decision Making  Evolving/Moderate complexity    Clinical Decision Making  Moderate    Rehab Potential  Good    PT Frequency  2x / week    PT Duration  8 weeks    PT Treatment/Interventions  ADLs/Self Care Home Management;Electrical Stimulation;Cryotherapy;Iontophoresis 4mg /ml Dexamethasone;Moist Heat;Traction;DME Instruction;Neuromuscular re-education;Patient/family education;Manual techniques;Passive range of motion;Taping    PT Next Visit Plan  manual therapy to improve hip ROM; begin light core strengthening; consider  supine postural exercises with breathing; consider TPDN    PT Home Exercise Plan  LTR; hamstring stretch, tennis ball trigger point release ; abdominal breathing    Consulted and Agree  with Plan of Care  Patient       Patient will benefit from skilled therapeutic intervention in order to improve the following deficits and impairments:  Abnormal gait, Difficulty walking, Decreased range of motion, Decreased mobility, Decreased strength, Postural dysfunction, Pain, Increased muscle spasms  Visit Diagnosis: Chronic bilateral low back pain without sciatica  Muscle weakness (generalized)  Cramp and spasm  Difficulty in walking, not elsewhere classified     Problem List Patient Active Problem List   Diagnosis Date Noted  . Essential hypertension 08/25/2018  . Abnormal SPEP 02/19/2018  . Hand pain 02/20/2017  . Multiple joint pain 02/18/2017  . Right elbow pain 12/09/2016  . Disturbed cognition 06/25/2016  . Trochanteric bursitis of left hip 02/18/2016  . Sciatica, right side 10/30/2015  . Bilateral arm pain 10/10/2015  . Trochanteric bursitis of right hip 08/04/2014  . Adjustment disorder with mixed anxiety and depressed mood 08/04/2014  . Chronic fatigue 08/04/2014  . Urinary frequency 08/04/2014  . Abnormality of gait 08/04/2014  . Unspecified visual disturbance 01/31/2013  . Transient vision disturbance 01/31/2013  . Routine general medical examination at a health care facility 11/04/2011  . Colon polyps 11/03/2011  . Hot flashes, menopausal 10/13/2011  . POSTHERPETIC NEURALGIA 10/03/2009  . DISPLCMT LUMBAR INTERVERT [place] W/O MYELOPATHY 09/05/2009  . RENAL CALCULUS, RECURRENT 09/04/2009  . HYPERLIPIDEMIA 08/31/2009  . MULTIPLE SCLEROSIS, PROGRESSIVE/RELAPSING 08/31/2009  . ALLERGIC RHINITIS 08/31/2009    Carney Living PT DPT  05/03/2019, 1:36 PM  [facility] [address] [place], [place], [address] Phone: [phone]   Fax:  [phone]  Name: Dawn [name] MRN: [id number] Date of Birth: 1946/07/15

## 2019-05-06 ENCOUNTER — Encounter

## 2019-05-11 ENCOUNTER — Other Ambulatory Visit: Payer: Self-pay

## 2019-05-11 ENCOUNTER — Ambulatory Visit: Payer: Medicare Other | Admitting: Physical Therapy

## 2019-05-11 ENCOUNTER — Encounter: Payer: Self-pay | Admitting: Physical Therapy

## 2019-05-11 DIAGNOSIS — R252 Cramp and spasm: Secondary | ICD-10-CM

## 2019-05-11 DIAGNOSIS — R262 Difficulty in walking, not elsewhere classified: Secondary | ICD-10-CM

## 2019-05-11 DIAGNOSIS — M6281 Muscle weakness (generalized): Secondary | ICD-10-CM

## 2019-05-11 DIAGNOSIS — M545 Low back pain: Secondary | ICD-10-CM | POA: Diagnosis not present

## 2019-05-11 DIAGNOSIS — G8929 Other chronic pain: Secondary | ICD-10-CM | POA: Diagnosis not present

## 2019-05-12 NOTE — Therapy (Signed)
[place], [place], [address] Phone: [phone]   Fax:  [phone]  Physical Therapy Treatment  Patient Details  Name: Dawn [name] MRN: [id number] Date of Birth: 1946-01-24 Referring Provider (PT): Dr Christell Constant   Encounter Date: 05/11/2019  PT End of Session - 05/11/19 1024    Visit Number  3    Number of Visits  12    Date for PT Re-Evaluation  06/21/19    Authorization Type  Medicare Kx Modifier on visit 13 proress not on 10th visit    PT Start Time  1015    PT Stop Time  1059    PT Time Calculation (min)  44 min    Activity Tolerance  Patient tolerated treatment well    Behavior During Therapy  [facility] for tasks assessed/performed       Past Medical History:  Diagnosis Date  . Allergy   . Essential hypertension 08/25/2018  . Hot flashes, menopausal 10/13/2011   Estradiol started   . Multiple sclerosis ([place])   . Neuropathy   . Osteoarthritis   . Vision abnormalities     Past Surgical History:  Procedure Laterality Date  . ABDOMINAL HYSTERECTOMY  2002   partial  . LUMBAR FUSION      There were no vitals filed for this visit.  Subjective Assessment - 05/11/19 1020    Subjective  Patient reports she is feeling better. She has been working on her exercises. She has a little soreness in her back today but it is better.    Pertinent History  MS    How long can you stand comfortably?  hurts as soon as she stands. Has to shift frequently    How long can you walk comfortably?  limited community ambualtion    Diagnostic tests  Nothign recent    Currently in Pain?  Yes    Pain Score  3     Pain Location  Hip    Pain Orientation  Right;Left    Pain Descriptors / Indicators  Aching    Pain Type  Chronic pain    Pain Radiating Towards  pain into the right hip    Pain Onset  More than a month ago    Pain Frequency  Constant    Aggravating Factors   standing and walking    Pain Relieving  Factors  heating pad    Effect of Pain on Daily Activities  difficulty standing                       [facility] Adult PT Treatment/Exercise - 05/12/19 0001      Lumbar Exercises: Stretches   Active Hamstring Stretch  3 reps;20 seconds    Lower Trunk Rotation Limitations  x10 with cuing for technique     Piriformis Stretch  3 reps;20 seconds      Lumbar Exercises: Supine   Pelvic Tilt Limitations  x10     Other Supine Lumbar Exercises  ball squeeze 2x10 with ab breathing     Other Supine Lumbar Exercises  hip abduction 2x10 red with cuing for abdominal breathing       Manual Therapy   Manual Therapy  Joint mobilization;Soft tissue mobilization    Manual therapy comments  skilled palpation of trigger points     Joint Mobilization  PA glides form L2 to L5     Soft tissue mobilization  to lumbar spine and upper gluteals bilateral  PT Education - 05/11/19 1024    Education Details  HEP and symptom mnmagement    Person(s) Educated  Patient    Methods  Explanation;Demonstration;Tactile cues;Verbal cues    Comprehension  Verbalized understanding;Returned demonstration;Verbal cues required;Tactile cues required       PT Short Term Goals - 05/03/19 1322      PT SHORT TERM GOAL #1   Title  Patient will report no pain radiating into her hip and anterior thigh    Baseline  105    Time  4    Period  Weeks    Status  On-going      PT SHORT TERM GOAL #2   Title  Patient will increase bilateral LE strength to 4/5    Baseline  4+/5 hip flexion 4+/5 hip abduction     Time  4    Status  On-going      PT SHORT TERM GOAL #3   Title  Patient will increase passive right hip flexion to 105 degrrees    Baseline  independnet with exercises given to her at this time     Time  4    Period  Weeks    Status  On-going    Target Date  05/24/19        PT Long Term Goals - 04/26/19 1038      PT LONG TERM GOAL #1   Title  Patient will stand for 30 min without  self reported increase pain in order to stand at a kitchen sink.    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 She had more difficulty with the right leg with exercises. Therapy will continue to advance exercises as tolerated. She is having her most pain when she bends to pick an item up. Therapy will review lifting techniue next visit.    Personal Factors and Comorbidities  Comorbidity 1;Comorbidity 2    Comorbidities  MS, OA    Examination-Activity Limitations  Locomotion Level;Carry;Stand;Lift;Stairs;Squat    Examination-Participation Restrictions  Meal Prep;Cleaning;Community Activity;Laundry;Shop    Stability/Clinical Decision Making  Evolving/Moderate complexity    Clinical Decision Making  Moderate    Rehab Potential  Good    PT Frequency  2x / week    PT Duration  8 weeks    PT Treatment/Interventions  ADLs/Self Care Home Management;Electrical Stimulation;Cryotherapy;Iontophoresis 4mg /ml Dexamethasone;Moist Heat;Traction;DME Instruction;Neuromuscular re-education;Patient/family education;Manual techniques;Passive range of motion;Taping    PT Next Visit Plan  review lkifting technique    PT Home Exercise Plan  LTR; hamstring stretch, tennis ball trigger point release ; abdominal breathing    Consulted and Agree  with Plan of Care  Patient       Patient will benefit from skilled therapeutic intervention in order to improve the following deficits and impairments:      Visit Diagnosis: Chronic bilateral low back pain without sciatica  Muscle weakness (generalized)  Cramp and spasm  Difficulty in walking, not elsewhere classified     Problem List Patient Active Problem List   Diagnosis Date Noted  . Essential hypertension 08/25/2018  . Abnormal SPEP 02/19/2018  . Hand pain 02/20/2017  . Multiple joint pain 02/18/2017  . Right elbow pain 12/09/2016  . Disturbed cognition 06/25/2016  . Trochanteric bursitis of left hip 02/18/2016  . Sciatica, right side 10/30/2015  . Bilateral arm pain 10/10/2015  . Trochanteric bursitis of right hip 08/04/2014  . Adjustment disorder with mixed anxiety and depressed mood 08/04/2014  . Chronic fatigue 08/04/2014  . Urinary frequency 08/04/2014  . Abnormality of gait 08/04/2014  . Unspecified visual disturbance 01/31/2013  . Transient vision disturbance 01/31/2013  . Routine general medical examination at a health care facility 11/04/2011  . Colon polyps 11/03/2011  . Hot flashes, menopausal 10/13/2011  . POSTHERPETIC NEURALGIA 10/03/2009  . DISPLCMT LUMBAR INTERVERT [place] W/O MYELOPATHY 09/05/2009  . RENAL CALCULUS, RECURRENT 09/04/2009  . HYPERLIPIDEMIA 08/31/2009  . MULTIPLE SCLEROSIS, PROGRESSIVE/RELAPSING 08/31/2009  . ALLERGIC RHINITIS 08/31/2009    Carney Living 05/12/2019, 3:20 PM  [facility] [address] [place], [place], [address] Phone: [phone]   Fax:  [phone]  Name: DEVINNE NEYER MRN: [id number] Date of Birth: Dec 22, 1945

## 2019-05-13 ENCOUNTER — Ambulatory Visit: Payer: Medicare Other | Admitting: Physical Therapy

## 2019-05-17 ENCOUNTER — Other Ambulatory Visit: Payer: Self-pay

## 2019-05-17 ENCOUNTER — Encounter: Payer: Self-pay | Admitting: Physical Therapy

## 2019-05-17 ENCOUNTER — Ambulatory Visit: Payer: Medicare Other | Admitting: Physical Therapy

## 2019-05-17 DIAGNOSIS — R262 Difficulty in walking, not elsewhere classified: Secondary | ICD-10-CM

## 2019-05-17 DIAGNOSIS — G8929 Other chronic pain: Secondary | ICD-10-CM

## 2019-05-17 DIAGNOSIS — R252 Cramp and spasm: Secondary | ICD-10-CM

## 2019-05-17 DIAGNOSIS — M6281 Muscle weakness (generalized): Secondary | ICD-10-CM

## 2019-05-17 DIAGNOSIS — M545 Low back pain: Secondary | ICD-10-CM | POA: Diagnosis not present

## 2019-05-17 NOTE — Therapy (Signed)
[place], [place], [address] Phone: [phone]   Fax:  [phone]  Physical Therapy Treatment  Patient Details  Name: Dawn [name] MRN: [id number] Date of Birth: [birth year] Referring Provider (PT): Dr Christell Constant   Encounter Date: 05/17/2019  PT End of Session - 05/17/19 1125    Visit Number  4    Number of Visits  12    Date for PT Re-Evaluation  06/21/19    Authorization Type  Medicare Kx Modifier on visit 13 proress not on 10th visit    PT Start Time  1015    PT Stop Time  1057    PT Time Calculation (min)  42 min    Activity Tolerance  Patient tolerated treatment well    Behavior During Therapy  [facility] for tasks assessed/performed       Past Medical History:  Diagnosis Date  . Allergy   . Essential hypertension 08/25/2018  . Hot flashes, menopausal 10/13/2011   Estradiol started   . Multiple sclerosis ([place])   . Neuropathy   . Osteoarthritis   . Vision abnormalities     Past Surgical History:  Procedure Laterality Date  . ABDOMINAL HYSTERECTOMY  2002   partial  . LUMBAR FUSION      There were no vitals filed for this visit.  Subjective Assessment - 05/17/19 1021    Subjective  Patient reports she has been recovering quicker. She has been using her stretches. She has been able to get back to her exercises.    Pertinent History  MS    How long can you stand comfortably?  hurts as soon as she stands. Has to shift frequently    How long can you walk comfortably?  limited community ambualtion    Diagnostic tests  Nothign recent    Currently in Pain?  Yes    Pain Score  3     Pain Location  Hip    Pain Orientation  Right    Pain Descriptors / Indicators  Aching    Pain Type  Chronic pain    Pain Radiating Towards  pain into the right hip    Pain Onset  More than a month ago    Pain Frequency  Constant    Aggravating Factors   standing and walking    Pain Relieving Factors   heating pad    Effect of Pain on Daily Activities  difficulty standing                       [facility] Adult PT Treatment/Exercise - 05/17/19 0001      Lumbar Exercises: Stretches   Active Hamstring Stretch  3 reps;20 seconds    Lower Trunk Rotation Limitations  x10 with cuing for technique     Piriformis Stretch  3 reps;20 seconds      Lumbar Exercises: Supine   Pelvic Tilt Limitations  x10     Other Supine Lumbar Exercises  ball squeeze 2x10 with ab breathing ; decompression leg lengthener x10; leg press x10     Other Supine Lumbar Exercises  hip abduction 2x10 red with cuing for abdominal breathing       Manual Therapy   Manual Therapy  Joint mobilization;Soft tissue mobilization    Manual therapy comments  skilled palpation of trigger points     Joint Mobilization  PA glides form L2 to L5     Soft tissue mobilization  to lumbar  spine and upper gluteals bilateral        Trigger Point Dry Needling - 05/17/19 0001    Consent Given?  Yes    Education Handout Provided  Previously provided    Muscles Treated Back/Hip  Gluteus medius;Lumbar multifidi    Gluteus Medius Response  Twitch response elicited    Lumbar multifidi Response  Twitch response elicited           PT Education - 05/17/19 1125    Education Details  reviewed improtance of ther-ex; reviewed technique with decompression position    Person(s) Educated  Patient    Methods  Demonstration;Tactile cues;Explanation;Verbal cues    Comprehension  Verbalized understanding;Returned demonstration;Verbal cues required;Tactile cues required       PT Short Term Goals - 05/17/19 1404      PT SHORT TERM GOAL #1   Title  Patient will report no pain radiating into her hip and anterior thigh    Time  4    Period  Weeks    Status  On-going    Target Date  05/24/19      PT SHORT TERM GOAL #2   Title  Patient will increase bilateral LE strength to 4/5    Baseline  4+/5 hip flexion 4+/5 hip abduction     Time   4    Period  Weeks    Status  On-going      PT SHORT TERM GOAL #3   Title  Patient will increase passive right hip flexion to 105 degrrees    Baseline  independnet with exercises given to her at this time     Time  4    Period  Weeks    Status  On-going    Target Date  05/24/19        PT Long Term Goals - 04/26/19 1038      PT LONG TERM GOAL #1   Title  Patient will stand for 30 min without self reported increase pain in order to stand at a kitchen sink.    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 The patient had no increase in pain with exercises. Therapy reviewed breathing with the patient with decompression. She could feel it more with proper breathing. She was encouraged to continue stretching at home.    Personal Factors and Comorbidities  Comorbidity 1;Comorbidity 2    Comorbidities  MS, OA    Examination-Activity Limitations  Locomotion Level;Carry;Stand;Lift;Stairs;Squat    Examination-Participation Restrictions  Meal Prep;Cleaning;Community Activity;Laundry;Shop    Stability/Clinical Decision Making  Evolving/Moderate complexity    Clinical Decision Making  Moderate    Rehab Potential  Good  PT Frequency  2x / week    PT Duration  8 weeks    PT Treatment/Interventions  ADLs/Self Care Home Management;Electrical Stimulation;Cryotherapy;Iontophoresis 4mg /ml Dexamethasone;Moist Heat;Traction;DME Instruction;Neuromuscular re-education;Patient/family education;Manual techniques;Passive  range of motion;Taping    PT Next Visit Plan  review lkifting technique    PT Home Exercise Plan  LTR; hamstring stretch, tennis ball trigger point release ; abdominal breathing    Consulted and Agree with Plan of Care  Patient       Patient will benefit from skilled therapeutic intervention in order to improve the following deficits and impairments:  Abnormal gait, Difficulty walking, Decreased range of motion, Decreased mobility, Decreased strength, Postural dysfunction, Pain, Increased muscle spasms  Visit Diagnosis: Chronic bilateral low back pain without sciatica  Muscle weakness (generalized)  Cramp and spasm  Difficulty in walking, not elsewhere classified     Problem List Patient Active Problem List   Diagnosis Date Noted  . Essential hypertension 08/25/2018  . Abnormal SPEP 02/19/2018  . Hand pain 02/20/2017  . Multiple joint pain 02/18/2017  . Right elbow pain 12/09/2016  . Disturbed cognition 06/25/2016  . Trochanteric bursitis of left hip 02/18/2016  . Sciatica, right side 10/30/2015  . Bilateral arm pain 10/10/2015  . Trochanteric bursitis of right hip 08/04/2014  . Adjustment disorder with mixed anxiety and depressed mood 08/04/2014  . Chronic fatigue 08/04/2014  . Urinary frequency 08/04/2014  . Abnormality of gait 08/04/2014  . Unspecified visual disturbance 01/31/2013  . Transient vision disturbance 01/31/2013  . Routine general medical examination at a health care facility 11/04/2011  . Colon polyps 11/03/2011  . Hot flashes, menopausal 10/13/2011  . POSTHERPETIC NEURALGIA 10/03/2009  . DISPLCMT LUMBAR INTERVERT [place] W/O MYELOPATHY 09/05/2009  . RENAL CALCULUS, RECURRENT 09/04/2009  . HYPERLIPIDEMIA 08/31/2009  . MULTIPLE SCLEROSIS, PROGRESSIVE/RELAPSING 08/31/2009  . ALLERGIC RHINITIS 08/31/2009    Carney Living  PT DPT  05/17/2019, 2:06 PM  [facility] [address] [place], [place],  [address] Phone: [phone]   Fax:  [phone]  Name: Dawn [name] MRN: [id number] Date of Birth: 1946/03/25

## 2019-05-20 ENCOUNTER — Other Ambulatory Visit: Payer: Self-pay

## 2019-05-20 ENCOUNTER — Ambulatory Visit: Payer: Medicare Other | Admitting: Physical Therapy

## 2019-05-20 ENCOUNTER — Encounter: Payer: Self-pay | Admitting: Physical Therapy

## 2019-05-20 DIAGNOSIS — M6281 Muscle weakness (generalized): Secondary | ICD-10-CM

## 2019-05-20 DIAGNOSIS — R252 Cramp and spasm: Secondary | ICD-10-CM

## 2019-05-20 DIAGNOSIS — R262 Difficulty in walking, not elsewhere classified: Secondary | ICD-10-CM

## 2019-05-20 DIAGNOSIS — G8929 Other chronic pain: Secondary | ICD-10-CM

## 2019-05-20 DIAGNOSIS — M545 Low back pain, unspecified: Secondary | ICD-10-CM

## 2019-05-20 NOTE — Therapy (Signed)
[place], [place], [address] Phone: [phone]   Fax:  [phone]  Physical Therapy Treatment  Patient Details  Name: Dawn [name] MRN: [id number] Date of Birth: Sep 11, 1945 Referring Provider (PT): Dr Christell Constant   Encounter Date: 05/20/2019  PT End of Session - 05/20/19 1045    Visit Number  5    Number of Visits  12    Date for PT Re-Evaluation  06/21/19    Authorization Type  Medicare Kx Modifier on visit 13 proress not on 10th visit    PT Start Time  1017    PT Stop Time  1100    PT Time Calculation (min)  43 min    Activity Tolerance  Patient tolerated treatment well    Behavior During Therapy  [facility] for tasks assessed/performed       Past Medical History:  Diagnosis Date  . Allergy   . Essential hypertension 08/25/2018  . Hot flashes, menopausal 10/13/2011   Estradiol started   . Multiple sclerosis ([place])   . Neuropathy   . Osteoarthritis   . Vision abnormalities     Past Surgical History:  Procedure Laterality Date  . ABDOMINAL HYSTERECTOMY  2002   partial  . LUMBAR FUSION      There were no vitals filed for this visit.  Subjective Assessment - 05/20/19 1020    Subjective  Patient reports her pain has been doing well. She has been working on her exercises.    Pertinent History  MS    How long can you stand comfortably?  hurts as soon as she stands. Has to shift frequently    How long can you walk comfortably?  limited community ambualtion    Diagnostic tests  Nothign recent    Currently in Pain?  Yes    Pain Score  4     Pain Location  Hip    Pain Orientation  Right    Pain Descriptors / Indicators  Aching    Pain Type  Chronic pain    Pain Radiating Towards  pain in the right hip    Pain Onset  More than a month ago    Pain Frequency  Constant    Aggravating Factors   Standing and walking    Pain Relieving Factors  heating pad    Effect of Pain on Daily Activities   diffciulty                       [facility] Adult PT Treatment/Exercise - 05/20/19 0001      Lumbar Exercises: Stretches   Active Hamstring Stretch  3 reps;20 seconds    Lower Trunk Rotation Limitations  x10 with cuing for technique     Piriformis Stretch  3 reps;20 seconds      Lumbar Exercises: Standing   Other Standing Lumbar Exercises  shoulder extension with abdominal breathing red 2x10       Lumbar Exercises: Seated   LAQ on Chair Limitations  2x10      Lumbar Exercises: Supine   Pelvic Tilt Limitations  x10     Other Supine Lumbar Exercises  ball squeeze 2x10 with ab breathing ; decompression leg lengthener x10; leg press x10     Other Supine Lumbar Exercises  hip abduction 2x10 red with cuing for abdominal breathing       Manual Therapy   Manual Therapy  Joint mobilization;Soft tissue mobilization    Manual therapy  comments  skilled palpation of trigger points     Joint Mobilization  PA glides form L2 to L5     Soft tissue mobilization  to lumbar spine and upper gluteals bilateral              PT Education - 05/20/19 1025    Education Details  HEP and symptom mangement    Person(s) Educated  Patient    Methods  Explanation;Demonstration;Tactile cues;Verbal cues    Comprehension  Returned demonstration;Verbal cues required;Verbalized understanding;Tactile cues required;Need further instruction       PT Short Term Goals - 05/20/19 1059      PT SHORT TERM GOAL #1   Title  Patient will report no pain radiating into her hip and anterior thigh    Baseline  105    Time  4    Period  Weeks    Target Date  05/24/19      PT SHORT TERM GOAL #2   Title  Patient will increase bilateral LE strength to 4/5    Baseline  4+/5 hip flexion 4+/5 hip abduction     Time  4    Period  Weeks    Status  On-going    Target Date  05/24/19      PT SHORT TERM GOAL #3   Title  Patient will increase passive right hip flexion to 105 degrrees    Baseline  independnet  with exercises given to her at this time     Time  4    Period  Weeks    Target Date  05/24/19        PT Long Term Goals - 04/26/19 1038      PT LONG TERM GOAL #1   Title  Patient will stand for 30 min without self reported increase pain in order to stand at a kitchen sink.    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 She had no increase in pain. Therapy was able to advance exercises.    Personal Factors and Comorbidities  Comorbidity 1;Comorbidity 2    Comorbidities  MS, OA    Examination-Activity Limitations  Locomotion Level;Carry;Stand;Lift;Stairs;Squat    PT Treatment/Interventions  ADLs/Self Care Home Management;Electrical Stimulation;Cryotherapy;Iontophoresis 4mg /ml Dexamethasone;Moist Heat;Traction;DME Instruction;Neuromuscular re-education;Patient/family education;Manual techniques;Passive range of motion;Taping    PT Home Exercise Plan  LTR; hamstring stretch, tennis ball trigger point release ; abdominal breathing    Consulted and Agree with Plan of Care  Patient       Patient will benefit from skilled therapeutic intervention in order to improve the following deficits and impairments:  Abnormal gait, Difficulty walking, Decreased range of motion, Decreased  mobility, Decreased strength, Postural dysfunction, Pain, Increased muscle spasms  Visit Diagnosis: Chronic bilateral low back pain without sciatica  Muscle weakness (generalized)  Cramp and spasm  Difficulty in walking, not  elsewhere classified     Problem List Patient Active Problem List   Diagnosis Date Noted  . Essential hypertension 08/25/2018  . Abnormal SPEP 02/19/2018  . Hand pain 02/20/2017  . Multiple joint pain 02/18/2017  . Right elbow pain 12/09/2016  . Disturbed cognition 06/25/2016  . Trochanteric bursitis of left hip 02/18/2016  . Sciatica, right side 10/30/2015  . Bilateral arm pain 10/10/2015  . Trochanteric bursitis of right hip 08/04/2014  . Adjustment disorder with mixed anxiety and depressed mood 08/04/2014  . Chronic fatigue 08/04/2014  . Urinary frequency 08/04/2014  . Abnormality of gait 08/04/2014  . Unspecified visual disturbance 01/31/2013  . Transient vision disturbance 01/31/2013  . Routine general medical examination at a health care facility 11/04/2011  . Colon polyps 11/03/2011  . Hot flashes, menopausal 10/13/2011  . POSTHERPETIC NEURALGIA 10/03/2009  . DISPLCMT LUMBAR INTERVERT [place] W/O MYELOPATHY 09/05/2009  . RENAL CALCULUS, RECURRENT 09/04/2009  . HYPERLIPIDEMIA 08/31/2009  . MULTIPLE SCLEROSIS, PROGRESSIVE/RELAPSING 08/31/2009  . ALLERGIC RHINITIS 08/31/2009    Carney Living  PT DPT  05/20/2019, 11:03 AM  [facility] [address] [place], [place], [address] Phone: [phone]   Fax:  [phone]  Name: Dawn [name] MRN: [id number] Date of Birth: 1946-05-18

## 2019-05-25 ENCOUNTER — Ambulatory Visit: Payer: Medicare Other | Attending: Neurology | Admitting: Physical Therapy

## 2019-05-25 ENCOUNTER — Other Ambulatory Visit: Payer: Self-pay

## 2019-05-25 ENCOUNTER — Encounter: Payer: Self-pay | Admitting: Physical Therapy

## 2019-05-25 DIAGNOSIS — M6281 Muscle weakness (generalized): Secondary | ICD-10-CM | POA: Diagnosis not present

## 2019-05-25 DIAGNOSIS — R262 Difficulty in walking, not elsewhere classified: Secondary | ICD-10-CM | POA: Diagnosis not present

## 2019-05-25 DIAGNOSIS — R252 Cramp and spasm: Secondary | ICD-10-CM | POA: Diagnosis not present

## 2019-05-25 DIAGNOSIS — G8929 Other chronic pain: Secondary | ICD-10-CM | POA: Diagnosis not present

## 2019-05-25 DIAGNOSIS — M545 Low back pain, unspecified: Secondary | ICD-10-CM

## 2019-05-25 NOTE — Therapy (Signed)
[place], [place], [address] Phone: [phone]   Fax:  [phone]  Physical Therapy Treatment  Patient Details  Name: Dawn [name] MRN: [id number] Date of Birth: 06-30-1946 Referring Provider (PT): Dr Christell Constant   Encounter Date: 05/25/2019  PT End of Session - 05/25/19 1024    Visit Number  6    Number of Visits  12    Date for PT Re-Evaluation  06/21/19    Authorization Type  Medicare Kx Modifier on visit 13 proress not on 10th visit    PT Start Time  1015    PT Stop Time  1056    PT Time Calculation (min)  41 min    Activity Tolerance  Patient tolerated treatment well    Behavior During Therapy  [facility] [place] for tasks assessed/performed       Past Medical History:  Diagnosis Date  . Allergy   . Essential hypertension 08/25/2018  . Hot flashes, menopausal 10/13/2011   Estradiol started   . Multiple sclerosis ([place])   . Neuropathy   . Osteoarthritis   . Vision abnormalities     Past Surgical History:  Procedure Laterality Date  . ABDOMINAL HYSTERECTOMY  2002   partial  . LUMBAR FUSION      There were no vitals filed for this visit.  Subjective Assessment - 05/25/19 1020    Subjective  Patient did 7 hours of phone banking so she is a little stiff. She is otherwise doing well.    Pertinent History  MS    How long can you stand comfortably?  hurts as soon as she stands. Has to shift frequently    How long can you walk comfortably?  limited community ambualtion    Diagnostic tests  Nothign recent    Currently in Pain?  Yes    Pain Score  3     Pain Location  Hip    Pain Orientation  Right    Pain Descriptors / Indicators  Aching    Pain Type  Chronic pain    Pain Onset  More than a month ago    Pain Frequency  Constant    Aggravating Factors   standing and walking    Pain Relieving Factors  heating pad    Effect of Pain on Daily Activities  difficulty                        [facility] Adult PT Treatment/Exercise - 05/25/19 0001      Lumbar Exercises: Stretches   Active Hamstring Stretch  3 reps;20 seconds    Lower Trunk Rotation Limitations  x10 with cuing for technique     Piriformis Stretch  3 reps;20 seconds      Lumbar Exercises: Standing   Other Standing Lumbar Exercises  shoulder extension with abdominal breathing red 2x10 Scpa retraction red       Lumbar Exercises: Seated   LAQ on Chair Limitations  2x10      Lumbar Exercises: Supine   Pelvic Tilt Limitations  x10     Other Supine Lumbar Exercises  ball squeeze 2x10 with ab breathing ; decompression leg lengthener x10; leg press x10     Other Supine Lumbar Exercises  hip abduction 2x10 red with cuing for abdominal breathing       Manual Therapy   Manual Therapy  Joint mobilization;Soft tissue mobilization    Manual therapy comments  skilled palpation of trigger  points     Joint Mobilization  PA glides form L2 to L5     Soft tissue mobilization  to lumbar spine and upper gluteals bilateral              PT Education - 05/25/19 1022    Education Details  benefits and risks of TPDN    Person(s) Educated  Patient    Methods  Explanation;Demonstration;Tactile cues;Verbal cues    Comprehension  Verbalized understanding;Returned demonstration       PT Short Term Goals - 05/20/19 1059      PT SHORT TERM GOAL #1   Title  Patient will report no pain radiating into her hip and anterior thigh    Baseline  105    Time  4    Period  Weeks    Target Date  05/24/19      PT SHORT TERM GOAL #2   Title  Patient will increase bilateral LE strength to 4/5    Baseline  4+/5 hip flexion 4+/5 hip abduction     Time  4    Period  Weeks    Status  On-going    Target Date  05/24/19      PT SHORT TERM GOAL #3   Title  Patient will increase passive right hip flexion to 105 degrrees    Baseline  independnet with exercises given to her at this time     Time  4     Period  Weeks    Target Date  05/24/19        PT Long Term Goals - 04/26/19 1038      PT LONG TERM GOAL #1   Title  Patient will stand for 30 min without self reported increase pain in order to stand at a kitchen sink.    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 She tolerated ther-ex well today. She continues to have trigger points in her hip but they are minor.    Personal Factors and Comorbidities  Comorbidity 1;Comorbidity 2    Comorbidities  MS, OA    Examination-Activity Limitations  Locomotion Level;Carry;Stand;Lift;Stairs;Squat    Examination-Participation Restrictions  Meal Prep;Cleaning;Community Activity;Laundry;Shop    Stability/Clinical Decision Making  Evolving/Moderate complexity    Clinical Decision Making  Moderate    Rehab Potential  Good    PT Frequency  2x / week    PT Duration  8 weeks    PT Treatment/Interventions  ADLs/Self Care Home Management;Electrical Stimulation;Cryotherapy;Iontophoresis 4mg /ml Dexamethasone;Moist Heat;Traction;DME Instruction;Neuromuscular re-education;Patient/family education;Manual techniques;Passive range of motion;Taping    PT Next Visit Plan  review lkifting technique    PT Home  Exercise Plan  LTR; hamstring stretch, tennis ball trigger point release ; abdominal breathing    Consulted and Agree with Plan of Care  Patient       Patient will benefit from skilled therapeutic intervention in order to improve the following deficits and  impairments:  Abnormal gait, Difficulty walking, Decreased range of motion, Decreased mobility, Decreased strength, Postural dysfunction, Pain, Increased muscle spasms  Visit Diagnosis: Chronic bilateral low back pain without sciatica  Muscle weakness (generalized)  Cramp and spasm  Difficulty in walking, not elsewhere classified     Problem List Patient Active Problem List   Diagnosis Date Noted  . Essential hypertension 08/25/2018  . Abnormal SPEP 02/19/2018  . Hand pain 02/20/2017  . Multiple joint pain 02/18/2017  . Right elbow pain 12/09/2016  . Disturbed cognition 06/25/2016  . Trochanteric bursitis of left hip 02/18/2016  . Sciatica, right side 10/30/2015  . Bilateral arm pain 10/10/2015  . Trochanteric bursitis of right hip 08/04/2014  . Adjustment disorder with mixed anxiety and depressed mood 08/04/2014  . Chronic fatigue 08/04/2014  . Urinary frequency 08/04/2014  . Abnormality of gait 08/04/2014  . Unspecified visual disturbance 01/31/2013  . Transient vision disturbance 01/31/2013  . Routine general medical examination at a health care facility 11/04/2011  . Colon polyps 11/03/2011  . Hot flashes, menopausal 10/13/2011  . POSTHERPETIC NEURALGIA 10/03/2009  . DISPLCMT LUMBAR INTERVERT [place] W/O MYELOPATHY 09/05/2009  . RENAL CALCULUS, RECURRENT 09/04/2009  . HYPERLIPIDEMIA 08/31/2009  . MULTIPLE SCLEROSIS, PROGRESSIVE/RELAPSING 08/31/2009  . ALLERGIC RHINITIS 08/31/2009    Carney Living PT DPT  05/25/2019, 9:02 PM  [facility] [address] [place], [place], [address] Phone: [phone]   Fax:  [phone]  Name: Dawn [name] MRN: [id number] Date of Birth: 1946-07-16

## 2019-05-27 ENCOUNTER — Ambulatory Visit: Payer: Medicare Other | Admitting: Physical Therapy

## 2019-06-02 ENCOUNTER — Encounter: Payer: Self-pay | Admitting: Physical Therapy

## 2019-06-02 ENCOUNTER — Other Ambulatory Visit: Payer: Self-pay

## 2019-06-02 ENCOUNTER — Ambulatory Visit: Payer: Medicare Other | Admitting: Physical Therapy

## 2019-06-02 DIAGNOSIS — R262 Difficulty in walking, not elsewhere classified: Secondary | ICD-10-CM

## 2019-06-02 DIAGNOSIS — R252 Cramp and spasm: Secondary | ICD-10-CM

## 2019-06-02 DIAGNOSIS — G8929 Other chronic pain: Secondary | ICD-10-CM | POA: Diagnosis not present

## 2019-06-02 DIAGNOSIS — M6281 Muscle weakness (generalized): Secondary | ICD-10-CM | POA: Diagnosis not present

## 2019-06-02 DIAGNOSIS — M545 Low back pain: Secondary | ICD-10-CM | POA: Diagnosis not present

## 2019-06-02 NOTE — Therapy (Signed)
[place], [place], [address] Phone: [phone]   Fax:  [phone]  Physical Therapy Treatment  Patient Details  Name: Dawn [name] MRN: [id number] Date of Birth: 10/12/1945 Referring Provider (PT): Dr Christell Constant   Encounter Date: 06/02/2019  PT End of Session - 06/02/19 1056    Visit Number  7    Number of Visits  12    Date for PT Re-Evaluation  06/21/19    Authorization Type  Medicare Kx Modifier on visit 13 proress not on 10th visit    PT Start Time  0930    PT Stop Time  1012    PT Time Calculation (min)  42 min    Activity Tolerance  Patient tolerated treatment well    Behavior During Therapy  [facility] for tasks assessed/performed       Past Medical History:  Diagnosis Date  . Allergy   . Essential hypertension 08/25/2018  . Hot flashes, menopausal 10/13/2011   Estradiol started   . Multiple sclerosis ([place])   . Neuropathy   . Osteoarthritis   . Vision abnormalities     Past Surgical History:  Procedure Laterality Date  . ABDOMINAL HYSTERECTOMY  2002   partial  . LUMBAR FUSION      There were no vitals filed for this visit.  Subjective Assessment - 06/02/19 0937    Subjective  Patient is having a little right elbow pain but overall she is doing well.    Pertinent History  MS    How long can you stand comfortably?  hurts as soon as she stands. Has to shift frequently    How long can you walk comfortably?  limited community ambualtion    Diagnostic tests  Nothign recent    Currently in Pain?  Yes    Pain Score  3     Pain Location  Hip    Pain Orientation  Right    Pain Descriptors / Indicators  Aching    Pain Type  Chronic pain    Pain Onset  More than a month ago    Pain Frequency  Constant    Aggravating Factors   standing and walking    Pain Relieving Factors  heating pad    Multiple Pain Sites  No                       [facility] Adult PT Treatment/Exercise  - 06/02/19 0001      Self-Care   Self-Care  Other Self-Care Comments    Other Self-Care Comments   reviewed TFM to lateral epicondyle and lateral epicondyle stretching. Elbow pain is preventing herfrom doing certain lumbar exercises       Lumbar Exercises: Stretches   Active Hamstring Stretch  3 reps;20 seconds    Lower Trunk Rotation Limitations  x10 with cuing for technique     Piriformis Stretch  3 reps;20 seconds      Lumbar Exercises: Standing   Other Standing Lumbar Exercises  shoulder extension with abdominal breathing red 2x10 Scpa retraction red       Lumbar Exercises: Seated   LAQ on Chair Limitations  2x10      Lumbar Exercises: Supine   Pelvic Tilt Limitations  x10     Other Supine Lumbar Exercises  ball squeeze 2x10 with ab breathing ; decompression leg lengthener x10; leg press x10     Other Supine Lumbar Exercises  hip abduction 2x10 red  with cuing for abdominal breathing       Manual Therapy   Manual Therapy  Joint mobilization;Soft tissue mobilization    Manual therapy comments  skilled palpation of trigger points     Joint Mobilization  PA glides form L2 to L5     Soft tissue mobilization  to lumbar spine and upper gluteals bilateral        Trigger Point Dry Needling - 06/02/19 0001    Consent Given?  Yes    Education Handout Provided  Previously provided    Muscles Treated Back/Hip  Gluteus medius;Lumbar multifidi    Other Dry Needling  2 needles to each area     Gluteus Medius Response  Twitch response elicited    Lumbar multifidi Response  Twitch response elicited           PT Education - 06/02/19 1053    Education Details  reviewed HEP    Person(s) Educated  Patient    Methods  Explanation;Tactile cues;Verbal cues;Demonstration    Comprehension  Verbalized understanding;Verbal cues required;Tactile cues required;Returned demonstration       PT Short Term Goals - 05/20/19 1059      PT SHORT TERM GOAL #1   Title  Patient will report no pain  radiating into her hip and anterior thigh    Baseline  105    Time  4    Period  Weeks    Target Date  05/24/19      PT SHORT TERM GOAL #2   Title  Patient will increase bilateral LE strength to 4/5    Baseline  4+/5 hip flexion 4+/5 hip abduction     Time  4    Period  Weeks    Status  On-going    Target Date  05/24/19      PT SHORT TERM GOAL #3   Title  Patient will increase passive right hip flexion to 105 degrrees    Baseline  independnet with exercises given to her at this time     Time  4    Period  Weeks    Target Date  05/24/19        PT Long Term Goals - 04/26/19 1038      PT LONG TERM GOAL #1   Title  Patient will stand for 30 min without self reported increase pain in order to stand at a kitchen sink.    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 Overall the patient is making good progress. Today things are a little bit more sore. dshe had a good twtich response in her gluteal.  Personal Factors and Comorbidities  Comorbidity 1;Comorbidity 2    Comorbidities  MS, OA    Examination-Activity Limitations  Locomotion Level;Carry;Stand;Lift;Stairs;Squat    Examination-Participation Restrictions  Meal Prep;Cleaning;Community Activity;Laundry;Shop    Stability/Clinical Decision Making  Evolving/Moderate complexity    Clinical Decision Making  Moderate    Rehab Potential  Good    PT Frequency  2x / week    PT Duration  8 weeks    PT Treatment/Interventions  ADLs/Self Care  Home Management;Electrical Stimulation;Cryotherapy;Iontophoresis 4mg /ml Dexamethasone;Moist Heat;Traction;DME Instruction;Neuromuscular re-education;Patient/family education;Manual techniques;Passive range of motion;Taping    PT Next Visit Plan  continue to progress ther-ex as tolerated; review lifting technique    PT Home Exercise Plan  LTR; hamstring stretch, tennis ball trigger point release ; abdominal breathing       Patient will benefit from skilled therapeutic intervention in order to improve the following deficits and impairments:  Abnormal gait, Difficulty walking, Decreased range of motion, Decreased mobility, Decreased strength, Postural dysfunction, Pain, Increased muscle spasms  Visit Diagnosis: Chronic bilateral low back pain without sciatica  Muscle weakness (generalized)  Cramp and spasm  Difficulty in walking, not elsewhere classified     Problem List Patient Active Problem List   Diagnosis Date Noted  . Essential hypertension 08/25/2018  . Abnormal SPEP 02/19/2018  . Hand pain 02/20/2017  . Multiple joint pain 02/18/2017  . Right elbow pain 12/09/2016  . Disturbed cognition 06/25/2016  . Trochanteric bursitis of left hip 02/18/2016  . Sciatica, right side 10/30/2015  . Bilateral arm pain 10/10/2015  . Trochanteric bursitis of right hip 08/04/2014  . Adjustment disorder with mixed anxiety and depressed mood 08/04/2014  . Chronic fatigue 08/04/2014  . Urinary frequency 08/04/2014  . Abnormality of gait 08/04/2014  . Unspecified visual disturbance 01/31/2013  . Transient vision disturbance 01/31/2013  . Routine general medical examination at a health care facility 11/04/2011  . Colon polyps 11/03/2011  . Hot flashes, menopausal 10/13/2011  . POSTHERPETIC NEURALGIA 10/03/2009  . DISPLCMT LUMBAR INTERVERT [place] W/O MYELOPATHY 09/05/2009  . RENAL CALCULUS, RECURRENT 09/04/2009  . HYPERLIPIDEMIA 08/31/2009  . MULTIPLE SCLEROSIS, PROGRESSIVE/RELAPSING 08/31/2009   . ALLERGIC RHINITIS 08/31/2009    Carney Living PT DPT  06/02/2019, 10:59 AM  [facility] [address] [place], [place], [address] Phone: [phone]   Fax:  [phone]  Name: Dawn [name] MRN: [id number] Date of Birth: 03-Sep-1945

## 2019-06-07 ENCOUNTER — Ambulatory Visit: Payer: Medicare Other | Admitting: Physical Therapy

## 2019-06-07 ENCOUNTER — Other Ambulatory Visit: Payer: Self-pay

## 2019-06-07 ENCOUNTER — Encounter: Payer: Self-pay | Admitting: Physical Therapy

## 2019-06-07 DIAGNOSIS — M545 Low back pain, unspecified: Secondary | ICD-10-CM

## 2019-06-07 DIAGNOSIS — R252 Cramp and spasm: Secondary | ICD-10-CM | POA: Diagnosis not present

## 2019-06-07 DIAGNOSIS — M6281 Muscle weakness (generalized): Secondary | ICD-10-CM

## 2019-06-07 DIAGNOSIS — G8929 Other chronic pain: Secondary | ICD-10-CM | POA: Diagnosis not present

## 2019-06-07 DIAGNOSIS — R262 Difficulty in walking, not elsewhere classified: Secondary | ICD-10-CM | POA: Diagnosis not present

## 2019-06-07 NOTE — Therapy (Signed)
[place], [place], [address] Phone: [phone]   Fax:  [phone]  Physical Therapy Treatment  Patient Details  Name: Dawn [name] MRN: [id number] Date of Birth: 1946-07-15 Referring Provider (PT): Dr Christell Constant   Encounter Date: 06/07/2019  PT End of Session - 06/07/19 1103    Visit Number  8    Number of Visits  12    Date for PT Re-Evaluation  06/21/19    Authorization Type  Medicare Kx Modifier on visit 13 proress not on 10th visit    PT Start Time  1015    PT Stop Time  1058    PT Time Calculation (min)  43 min    Activity Tolerance  Patient tolerated treatment well    Behavior During Therapy  [facility] for tasks assessed/performed       Past Medical History:  Diagnosis Date  . Allergy   . Essential hypertension 08/25/2018  . Hot flashes, menopausal 10/13/2011   Estradiol started   . Multiple sclerosis ([place])   . Neuropathy   . Osteoarthritis   . Vision abnormalities     Past Surgical History:  Procedure Laterality Date  . ABDOMINAL HYSTERECTOMY  2002   partial  . LUMBAR FUSION      There were no vitals filed for this visit.  Subjective Assessment - 06/07/19 1302    Subjective  Patient reports her elbow has improved. the pain in her hip has improved as well. She is feeling most of the pain in the lower back.    Pertinent History  MS    How long can you stand comfortably?  hurts as soon as she stands. Has to shift frequently    How long can you walk comfortably?  limited community ambualtion    Diagnostic tests  Nothign recent    Currently in Pain?  Yes    Pain Score  3     Pain Location  Back    Pain Orientation  Mid    Pain Descriptors / Indicators  Aching    Pain Type  Chronic pain    Pain Radiating Towards  pain into the right hip    Pain Onset  More than a month ago    Pain Frequency  Intermittent    Aggravating Factors   Standing and walking    Pain Relieving Factors   heating pad    Effect of Pain on Daily Activities  difficulty perfroming ADL's                       [facility] Adult PT Treatment/Exercise - 06/07/19 0001      Self-Care   Other Self-Care Comments   Therapy worked on hip hinging;. A chair was put behind the patient and she was encouraged to make like she was going to sit. She had some posterior loss of balance but was able to correct with her hands. she will need further transfer training.       Lumbar Exercises: Stretches   Active Hamstring Stretch  3 reps;20 seconds    Lower Trunk Rotation Limitations  x10 with cuing for technique       Lumbar Exercises: Seated   LAQ on Chair Limitations  2x10      Lumbar Exercises: Supine   Other Supine Lumbar Exercises  [id number]       Manual Therapy   Manual Therapy  Joint mobilization;Soft tissue mobilization    Manual therapy  comments  skilled palpation of trigger points     Joint Mobilization  PA glides from L2 to L5     Soft tissue mobilization  to lumbar spine and upper gluteals bilateral        Trigger Point Dry Needling - 06/07/19 0001    Consent Given?  Yes    Education Handout Provided  Previously provided    Muscles Treated Back/Hip  Lumbar multifidi    Lumbar multifidi Response  Twitch response elicited;Palpable increased muscle length           PT Education - 06/07/19 1103    Person(s) Educated  Patient    Methods  Explanation;Demonstration;Tactile cues;Verbal cues    Comprehension  Verbalized understanding;Returned demonstration;Verbal cues required;Tactile cues required;Need further instruction       PT Short Term Goals - 06/07/19 1315      PT SHORT TERM GOAL #1   Title  Patient will report no pain radiating into her hip and anterior thigh    Baseline  105    Time  4    Period  Weeks    Status  On-going    Target Date  05/24/19      PT SHORT TERM GOAL #2   Title  Patient will increase bilateral LE strength to 4/5    Baseline  4+/5 hip flexion 4+/5  hip abduction     Time  4    Period  Weeks    Status  On-going    Target Date  05/24/19      PT SHORT TERM GOAL #3   Title  Patient will increase passive right hip flexion to 105 degrrees    Baseline  independnet with exercises given to her at this time     Time  4    Period  Weeks    Status  On-going    Target Date  05/24/19        PT Long Term Goals - 04/26/19 1038      PT LONG TERM GOAL #1   Title  Patient will stand for 30 min without self reported increase pain in order to stand at a kitchen sink.    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 Therapy was able to review proiper lifting technique with the patient. She will require some trinaing for hip disassociation. She tolerated needling well.    Personal Factors and Comorbidities  Comorbidity 1;Comorbidity 2    Comorbidities  MS, OA    Examination-Activity Limitations  Locomotion Level;Carry;Stand;Lift;Stairs;Squat    Examination-Participation Restrictions  Meal Prep;Cleaning;Community Activity;Laundry;Shop    Stability/Clinical Decision Making  Evolving/Moderate complexity  Clinical Decision Making  Moderate    Rehab Potential  Good    PT Frequency  2x / week    PT Duration  8 weeks    PT Treatment/Interventions  ADLs/Self Care Home Management;Electrical Stimulation;Cryotherapy;Iontophoresis 4mg /ml Dexamethasone;Moist Heat;Traction;DME Instruction;Neuromuscular re-education;Patient/family education;Manual techniques;Passive  range of motion;Taping    PT Next Visit Plan  continue to progress ther-ex as tolerated; review lifting technique    PT Home Exercise Plan  LTR; hamstring stretch, tennis ball trigger point release ; abdominal breathing    Consulted and Agree with Plan of Care  Patient       Patient will benefit from skilled therapeutic intervention in order to improve the following deficits and impairments:  Abnormal gait, Difficulty walking, Decreased range of motion, Decreased mobility, Decreased strength, Postural dysfunction, Pain, Increased muscle spasms  Visit Diagnosis: Chronic bilateral low back pain without sciatica  Muscle weakness (generalized)  Cramp and spasm  Difficulty in walking, not elsewhere classified     Problem List Patient Active Problem List   Diagnosis Date Noted  . Essential hypertension 08/25/2018  . Abnormal SPEP 02/19/2018  . Hand pain 02/20/2017  . Multiple joint pain 02/18/2017  . Right elbow pain 12/09/2016  . Disturbed cognition 06/25/2016  . Trochanteric bursitis of left hip 02/18/2016  . Sciatica, right side 10/30/2015  . Bilateral arm pain 10/10/2015  . Trochanteric bursitis of right hip 08/04/2014  . Adjustment disorder with mixed anxiety and depressed mood 08/04/2014  . Chronic fatigue 08/04/2014  . Urinary frequency 08/04/2014  . Abnormality of gait 08/04/2014  . Unspecified visual disturbance 01/31/2013  . Transient vision disturbance 01/31/2013  . Routine general medical examination at a health care facility 11/04/2011  . Colon polyps 11/03/2011  . Hot flashes, menopausal 10/13/2011  . POSTHERPETIC NEURALGIA 10/03/2009  . DISPLCMT LUMBAR INTERVERT [place] W/O MYELOPATHY 09/05/2009  . RENAL CALCULUS, RECURRENT 09/04/2009  . HYPERLIPIDEMIA 08/31/2009  . MULTIPLE SCLEROSIS, PROGRESSIVE/RELAPSING 08/31/2009  . ALLERGIC RHINITIS 08/31/2009    Carney Living PT DPT  06/07/2019, 1:21 PM  [facility] [address] [place], [place], [address] Phone: [phone]   Fax:  [phone]  Name: Dawn [name] MRN: [id number] Date of Birth: 09/12/1945

## 2019-06-14 ENCOUNTER — Other Ambulatory Visit: Payer: Self-pay

## 2019-06-14 ENCOUNTER — Encounter: Payer: Self-pay | Admitting: Physical Therapy

## 2019-06-14 ENCOUNTER — Ambulatory Visit: Payer: Medicare Other | Admitting: Physical Therapy

## 2019-06-14 DIAGNOSIS — R252 Cramp and spasm: Secondary | ICD-10-CM | POA: Diagnosis not present

## 2019-06-14 DIAGNOSIS — M545 Low back pain, unspecified: Secondary | ICD-10-CM

## 2019-06-14 DIAGNOSIS — G8929 Other chronic pain: Secondary | ICD-10-CM

## 2019-06-14 DIAGNOSIS — M6281 Muscle weakness (generalized): Secondary | ICD-10-CM | POA: Diagnosis not present

## 2019-06-14 DIAGNOSIS — R262 Difficulty in walking, not elsewhere classified: Secondary | ICD-10-CM

## 2019-06-14 NOTE — Therapy (Signed)
[place], [place], [address] Phone: [phone]   Fax:  [phone]  Physical Therapy Treatment  Patient Details  Name: Dawn [name] MRN: [id number] Date of Birth: 1946-06-10 Referring Provider (PT): Dr Christell Constant   Encounter Date: 06/14/2019  PT End of Session - 06/14/19 1439    Visit Number  9    Number of Visits  12    Date for PT Re-Evaluation  06/21/19    Authorization Type  Medicare Kx Modifier on visit 13 proress not on 10th visit    PT Start Time  1015    PT Stop Time  1058    PT Time Calculation (min)  43 min    Activity Tolerance  Patient tolerated treatment well    Behavior During Therapy  [facility] for tasks assessed/performed       Past Medical History:  Diagnosis Date  . Allergy   . Essential hypertension 08/25/2018  . Hot flashes, menopausal 10/13/2011   Estradiol started   . Multiple sclerosis ([place])   . Neuropathy   . Osteoarthritis   . Vision abnormalities     Past Surgical History:  Procedure Laterality Date  . ABDOMINAL HYSTERECTOMY  2002   partial  . LUMBAR FUSION      There were no vitals filed for this visit.  Subjective Assessment - 06/14/19 1023    Subjective  Patient began having pain in her riht hip yesterday. It is hurting the most when she is up and walking.    How long can you stand comfortably?  hurts as soon as she stands. Has to shift frequently    How long can you walk comfortably?  limited community ambualtion    Diagnostic tests  Nothign recent    Currently in Pain?  Yes    Pain Score  5     Pain Location  Back    Pain Orientation  Right    Pain Descriptors / Indicators  Aching    Pain Type  Chronic pain    Pain Radiating Towards  pain into the right hip    Pain Onset  More than a month ago    Pain Frequency  Intermittent    Aggravating Factors   standing and walking    Pain Relieving Factors  heating pad    Effect of Pain on Daily Activities   difficulty perfroming ADl's                       [facility] Adult PT Treatment/Exercise - 06/14/19 0001      Lumbar Exercises: Stretches   Active Hamstring Stretch  3 reps;20 seconds    Lower Trunk Rotation Limitations  x10 with cuing for technique     Piriformis Stretch Limitations  reviewed on the left; unable to do comfortably on the right      Lumbar Exercises: Seated   LAQ on Chair Limitations  2x10      Lumbar Exercises: Supine   Pelvic Tilt Limitations  x10     Other Supine Lumbar Exercises  clam shell yellow 3x10       Manual Therapy   Manual Therapy  Joint mobilization;Soft tissue mobilization    Manual therapy comments  skilled palpation of trigger points     Joint Mobilization  PA glides from L2 to L5     Soft tissue mobilization  to lumbar spine and upper gluteals bilateral  Trigger Point Dry Needling - 06/14/19 0001    Consent Given?  Yes    Muscles Treated Back/Hip  Lumbar multifidi    Dry Needling Comments  bilateral     Gluteus Medius Response  Twitch response elicited           PT Education - 06/14/19 1024    Education Details  HEP and symptom mangement    Person(s) Educated  Patient    Methods  Explanation;Demonstration;Tactile cues;Verbal cues    Comprehension  Verbalized understanding;Returned demonstration;Verbal cues required;Tactile cues required       PT Short Term Goals - 06/14/19 1442      PT SHORT TERM GOAL #1   Title  Patient will report no pain radiating into her hip and anterior thigh    Baseline  105    Time  4    Period  Weeks    Status  On-going    Target Date  05/24/19      PT SHORT TERM GOAL #2   Title  Patient will increase bilateral LE strength to 4/5    Baseline  4+/5 hip flexion 4+/5 hip abduction     Time  4    Status  On-going    Target Date  05/24/19      PT SHORT TERM GOAL #3   Title  Patient will increase passive right hip flexion to 105 degrrees    Baseline  independnet with exercises given  to her at this time     Time  4    Period  Weeks    Status  On-going    Target Date  05/24/19        PT Long Term Goals - 04/26/19 1038      PT LONG TERM GOAL #1   Title  Patient will stand for 30 min without self reported increase pain in order to stand at a kitchen sink.    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 Therapy needled on both left and right today. She had a good twtich response in each area. Therapy focuse don manul therapy. She had increased difficulty with self glute stretching on the right. Therapy also reviewed hamstring stretching. She was encouraged when she is flaired up to use her stretches, tennis ball, and light exercises.    Personal Factors and Comorbidities  Comorbidity 1;Comorbidity 2    Comorbidities  MS, OA    Examination-Activity Limitations  Locomotion Level;Carry;Stand;Lift;Stairs;Squat    Examination-Participation Restrictions  Meal Prep;Cleaning;Community Activity;Laundry;Shop    Stability/Clinical Decision Making  Evolving/Moderate complexity    Clinical Decision Making  Moderate    Rehab Potential  Good  PT Frequency  2x / week    PT Duration  8 weeks    PT Treatment/Interventions  ADLs/Self Care Home Management;Electrical Stimulation;Cryotherapy;Iontophoresis 4mg /ml Dexamethasone;Moist Heat;Traction;DME Instruction;Neuromuscular re-education;Patient/family education;Manual techniques;Passive range of motion;Taping    PT Next Visit Plan  continue to progress  ther-ex as tolerated; review lifting technique    PT Home Exercise Plan  LTR; hamstring stretch, tennis ball trigger point release ; abdominal breathing    Consulted and Agree with Plan of Care  Patient       Patient will benefit from skilled therapeutic intervention in order to improve the following deficits and impairments:  Abnormal gait, Difficulty walking, Decreased range of motion, Decreased mobility, Decreased strength, Postural dysfunction, Pain, Increased muscle spasms  Visit Diagnosis: Chronic bilateral low back pain without sciatica  Muscle weakness (generalized)  Cramp and spasm  Difficulty in walking, not elsewhere classified     Problem List Patient Active Problem List   Diagnosis Date Noted  . Essential hypertension 08/25/2018  . Abnormal SPEP 02/19/2018  . Hand pain 02/20/2017  . Multiple joint pain 02/18/2017  . Right elbow pain 12/09/2016  . Disturbed cognition 06/25/2016  . Trochanteric bursitis of left hip 02/18/2016  . Sciatica, right side 10/30/2015  . Bilateral arm pain 10/10/2015  . Trochanteric bursitis of right hip 08/04/2014  . Adjustment disorder with mixed anxiety and depressed mood 08/04/2014  . Chronic fatigue 08/04/2014  . Urinary frequency 08/04/2014  . Abnormality of gait 08/04/2014  . Unspecified visual disturbance 01/31/2013  . Transient vision disturbance 01/31/2013  . Routine general medical examination at a health care facility 11/04/2011  . Colon polyps 11/03/2011  . Hot flashes, menopausal 10/13/2011  . POSTHERPETIC NEURALGIA 10/03/2009  . DISPLCMT LUMBAR INTERVERT [place] W/O MYELOPATHY 09/05/2009  . RENAL CALCULUS, RECURRENT 09/04/2009  . HYPERLIPIDEMIA 08/31/2009  . MULTIPLE SCLEROSIS, PROGRESSIVE/RELAPSING 08/31/2009  . ALLERGIC RHINITIS 08/31/2009    Carney Living PT DPT  06/14/2019, 2:49 PM  [facility] [address] [place], [place], [address] Phone: [phone]    Fax:  [phone]  Name: Dawn [name] MRN: [id number] Date of Birth: April 11, 1946

## 2019-06-23 ENCOUNTER — Other Ambulatory Visit: Payer: Self-pay

## 2019-06-23 ENCOUNTER — Ambulatory Visit: Payer: Medicare Other | Attending: Neurology | Admitting: Physical Therapy

## 2019-06-23 DIAGNOSIS — R252 Cramp and spasm: Secondary | ICD-10-CM

## 2019-06-23 DIAGNOSIS — M6281 Muscle weakness (generalized): Secondary | ICD-10-CM | POA: Diagnosis present

## 2019-06-23 DIAGNOSIS — G8929 Other chronic pain: Secondary | ICD-10-CM | POA: Insufficient documentation

## 2019-06-23 DIAGNOSIS — M545 Low back pain, unspecified: Secondary | ICD-10-CM

## 2019-06-23 DIAGNOSIS — R262 Difficulty in walking, not elsewhere classified: Secondary | ICD-10-CM | POA: Insufficient documentation

## 2019-06-24 ENCOUNTER — Encounter: Payer: Self-pay | Admitting: Physical Therapy

## 2019-06-24 NOTE — Therapy (Signed)
[place], [place], [address] Phone: [phone]   Fax:  [phone]  Physical Therapy Treatment/Progress Note   Patient Details  Name: Dawn [name] MRN: [id number] Date of Birth: Jul 06, 1946 Referring Provider (PT): Dr Christell Constant  Progress Note Reporting Period 04/26/2019 to 06/24/2019  See note below for Objective Data and Assessment of Progress/Goals.      Encounter Date: 06/23/2019  PT End of Session - 06/23/19 1107    Visit Number  10    Number of Visits  12    Date for PT Re-Evaluation  06/21/19    Authorization Type  Medicare Kx Modifier on visit 13 proress not on 10th visit    PT Start Time  1016    PT Stop Time  1102    PT Time Calculation (min)  46 min    Activity Tolerance  Patient tolerated treatment well    Behavior During Therapy  [facility] for tasks assessed/performed       Past Medical History:  Diagnosis Date  . Allergy   . Essential hypertension 08/25/2018  . Hot flashes, menopausal 10/13/2011   Estradiol started   . Multiple sclerosis ([place])   . Neuropathy   . Osteoarthritis   . Vision abnormalities     Past Surgical History:  Procedure Laterality Date  . ABDOMINAL HYSTERECTOMY  2002   partial  . LUMBAR FUSION      There were no vitals filed for this visit.  Subjective Assessment - 06/24/19 0742    Subjective  Patient    Pertinent History  MS    How long can you stand comfortably?  hurts as soon as she stands. Has to shift frequently    How long can you walk comfortably?  limited community ambualtion    Diagnostic tests  Nothign recent    Currently in Pain?  Yes    Pain Score  5     Pain Location  Hip    Pain Orientation  Left    Pain Descriptors / Indicators  Aching    Pain Type  Chronic pain    Pain Radiating Towards  pain in the right hip    Pain Onset  More than a month ago    Pain Frequency  Intermittent    Aggravating Factors   standing and walking    Pain Relieving Factors  heating pad    Effect of Pain on Daily Activities  difficulty perfroming ADL's         [facility] PT Assessment - 06/24/19 0001      AROM   Overall AROM Comments  pain with end range hip ROM       Strength   Right Hip Flexion  4/5    Right Hip ABduction  4/5    Right Hip ADduction  4/5    Left Hip Flexion  4/5    Left Hip ABduction  4/5    Left Hip ADduction  4/5    Right Knee Flexion  4+/5    Right Knee Extension  4+/5    Left Knee Flexion  4+/5    Left Knee Extension  4+/5      Palpation   Palpation comment  improved spamsoing bilateral                    [facility] Adult PT Treatment/Exercise - 06/24/19 0001      Lumbar Exercises: Seated   LAQ on Chair Limitations  2x10 yellow  band     Other Seated Lumbar Exercises  hamstring curl 2x10 yellow each leg; clamshell 2x10 yellow; ball squeeze 2x10 yellow       Lumbar Exercises: Supine   Pelvic Tilt Limitations  x10       Manual Therapy   Manual Therapy  Joint mobilization;Soft tissue mobilization    Manual therapy comments  skilled palpation of trigger points     Joint Mobilization  PA glides from L2 to L5     Soft tissue mobilization  to lumbar spine and upper gluteals bilateral        Trigger Point Dry Needling - 06/24/19 0001    Consent Given?  Yes    Muscles Treated Back/Hip  Lumbar multifidi    Dry Needling Comments  bilateral glut medius 2 spots each     Gluteus Medius Response  Twitch response elicited           PT Education - 06/24/19 0746    Education Details  HEP and symptom mangement    Person(s) Educated  Patient    Methods  Explanation;Demonstration;Tactile cues;Verbal cues    Comprehension  Verbal cues required;Tactile cues required;Returned demonstration;Verbalized understanding       PT Short Term Goals - 06/24/19 0751      PT SHORT TERM GOAL #1   Title  Patient will report no pain radiating into her hip and anterior thigh    Baseline  105    Time  4     Period  Weeks    Status  On-going      PT SHORT TERM GOAL #2   Title  Patient will increase bilateral LE strength to 4/5    Baseline  4/5 bilateral    Time  4    Period  Weeks    Status  On-going    Target Date  05/24/19      PT SHORT TERM GOAL #3   Title  Patient will increase passive right hip flexion to 105 degrrees    Baseline  pain with end range hip flexion    Time  4    Period  Weeks    Status  On-going    Target Date  05/24/19        PT Long Term Goals - 04/26/19 1038      PT LONG TERM GOAL #1   Title  Patient will stand for 30 min without self reported increase pain in order to stand at a kitchen sink.    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 She feels like she is able to stand longer to perfrom  ADl's and IADL's. She is also walkiung further in the community. She had more pain on the left side today but reported improved pain with needling sand manual therapy. She feels like her seated exercises are making her streonger. She was given light resistance for home for LAQ and ahmstring curl. Patient would benefit from further skilled therapy 2W6 to continue to improve strength and overall mobility    Comorbidities  MS, OA    Examination-Activity Limitations  Locomotion Level;Carry;Stand;Lift;Stairs;Squat    Examination-Participation Restrictions  Meal Prep;Cleaning;Community Activity;Laundry;Shop    Stability/Clinical Decision Making  Evolving/Moderate  complexity    Clinical Decision Making  Moderate    Rehab Potential  Good    PT Frequency  2x / week    PT Duration  8 weeks    PT Treatment/Interventions  ADLs/Self Care Home Management;Electrical Stimulation;Cryotherapy;Iontophoresis 4mg /ml Dexamethasone;Moist Heat;Traction;DME Instruction;Neuromuscular re-education;Patient/family education;Manual techniques;Passive range of motion;Taping    PT Next Visit Plan  continue to progress ther-ex as tolerated; review lifting technique    PT Home Exercise Plan  LTR; hamstring stretch, tennis ball trigger point release ; abdominal breathing    Consulted and Agree with Plan of Care  Patient       Patient will benefit from skilled therapeutic intervention in order to improve the following deficits and impairments:  Abnormal gait, Difficulty walking, Decreased range of motion, Decreased mobility, Decreased strength, Postural dysfunction, Pain, Increased muscle spasms  Visit Diagnosis: Chronic bilateral low back pain without sciatica  Muscle weakness (generalized)  Cramp and spasm  Difficulty in walking, not elsewhere classified     Problem List Patient Active Problem List   Diagnosis Date Noted  . Essential hypertension 08/25/2018  . Abnormal SPEP 02/19/2018  . Hand pain 02/20/2017  . Multiple joint pain 02/18/2017  . Right elbow pain 12/09/2016  . Disturbed cognition 06/25/2016  . Trochanteric bursitis of left hip 02/18/2016  . Sciatica, right side 10/30/2015  . Bilateral arm pain 10/10/2015  . Trochanteric bursitis of right hip 08/04/2014  . Adjustment disorder with mixed anxiety and depressed mood 08/04/2014  . Chronic fatigue 08/04/2014  . Urinary frequency 08/04/2014  . Abnormality of gait 08/04/2014  . Unspecified visual disturbance 01/31/2013  . Transient vision disturbance 01/31/2013  . Routine general medical examination at a health care facility 11/04/2011  . Colon polyps 11/03/2011  . Hot flashes, menopausal 10/13/2011   . POSTHERPETIC NEURALGIA 10/03/2009  . DISPLCMT LUMBAR INTERVERT [place] W/O MYELOPATHY 09/05/2009  . RENAL CALCULUS, RECURRENT 09/04/2009  . HYPERLIPIDEMIA 08/31/2009  . MULTIPLE SCLEROSIS, PROGRESSIVE/RELAPSING 08/31/2009  . ALLERGIC RHINITIS 08/31/2009    Carney Living PT DPT  06/24/2019, 7:56 AM  [facility] [address] [place], [place], [address] Phone: [phone]   Fax:  [phone]  Name: SANISHA CLANTON MRN: [id number] Date of Birth: 04-29-1946

## 2019-06-30 ENCOUNTER — Encounter: Payer: Self-pay | Admitting: Physical Therapy

## 2019-06-30 ENCOUNTER — Ambulatory Visit: Payer: Medicare Other | Admitting: Physical Therapy

## 2019-06-30 ENCOUNTER — Other Ambulatory Visit: Payer: Self-pay

## 2019-06-30 DIAGNOSIS — R252 Cramp and spasm: Secondary | ICD-10-CM

## 2019-06-30 DIAGNOSIS — R262 Difficulty in walking, not elsewhere classified: Secondary | ICD-10-CM

## 2019-06-30 DIAGNOSIS — G8929 Other chronic pain: Secondary | ICD-10-CM

## 2019-06-30 DIAGNOSIS — M6281 Muscle weakness (generalized): Secondary | ICD-10-CM

## 2019-06-30 DIAGNOSIS — M545 Low back pain, unspecified: Secondary | ICD-10-CM

## 2019-06-30 NOTE — Therapy (Signed)
[place], [place], [address] Phone: [phone]   Fax:  [phone]  Physical Therapy Treatment  Patient Details  Name: Dawn [name] MRN: [id number] Date of Birth: 09-29-1945 Referring Provider (PT): Dr Christell Constant   Encounter Date: 06/30/2019  PT End of Session - 06/30/19 1130    Visit Number  11    Number of Visits  22    Date for PT Re-Evaluation  08/05/19    Authorization Type  Medicare Kx Modifier on visit 13 proress not on 10th visit    PT Start Time  1015    PT Stop Time  1058    PT Time Calculation (min)  43 min    Activity Tolerance  Patient tolerated treatment well    Behavior During Therapy  [facility] for tasks assessed/performed       Past Medical History:  Diagnosis Date  . Allergy   . Essential hypertension 08/25/2018  . Hot flashes, menopausal 10/13/2011   Estradiol started   . Multiple sclerosis ([place])   . Neuropathy   . Osteoarthritis   . Vision abnormalities     Past Surgical History:  Procedure Laterality Date  . ABDOMINAL HYSTERECTOMY  2002   partial  . LUMBAR FUSION      There were no vitals filed for this visit.  Subjective Assessment - 06/30/19 1106    Subjective  She has been working hard on her exercises    Currently in Pain?  Yes    Pain Score  3     Pain Location  Heel    Pain Orientation  Right    Pain Descriptors / Indicators  Aching    Pain Type  Chronic pain    Pain Onset  More than a month ago    Pain Frequency  Intermittent    Aggravating Factors   standing and walking    Pain Relieving Factors  heating pad and light exercises    Effect of Pain on Daily Activities  DIFFICULTY PERFROMING adl;S                       [facility] Adult PT Treatment/Exercise - 06/30/19 0001      Lumbar Exercises: Stretches   Active Hamstring Stretch  3 reps;20 seconds    Lower Trunk Rotation Limitations  x10 with cuing for technique     Piriformis Stretch  Limitations  2x20 sec hold bilateral       Lumbar Exercises: Standing   Heel Raises Limitations  2x10     Other Standing Lumbar Exercises  standing march with min cuing       Lumbar Exercises: Seated   LAQ on Chair Limitations  2x10 yellow band     Other Seated Lumbar Exercises  hamstring curl 2x10 yellow each leg; clamshell 2x10 yellow; ball squeeze 2x10 yellow     Other Seated Lumbar Exercises  clamshell 2x10       Lumbar Exercises: Supine   Pelvic Tilt Limitations  x10       Manual Therapy   Manual Therapy  Joint mobilization;Soft tissue mobilization    Manual therapy comments  skilled palpation of trigger points     Joint Mobilization  PA glides from L2 to L5     Soft tissue mobilization  to lumbar spine and upper gluteals bilateral        Trigger Point Dry Needling - 06/30/19 0001    Consent Given?  Yes  Dry Needling Comments  right sided 3 needles     Gluteus Medius Response  Twitch response elicited           PT Education - 06/30/19 1130    Education Details  reviewed improtance of improving tolerance to standing exercises    Person(s) Educated  Patient    Methods  Explanation;Demonstration;Tactile cues;Verbal cues    Comprehension  Verbalized understanding;Returned demonstration;Verbal cues required;Tactile cues required       PT Short Term Goals - 06/24/19 0751      PT SHORT TERM GOAL #1   Title  Patient will report no pain radiating into her hip and anterior thigh    Baseline  105    Time  4    Period  Weeks    Status  On-going      PT SHORT TERM GOAL #2   Title  Patient will increase bilateral LE strength to 4/5    Baseline  4/5 bilateral    Time  4    Period  Weeks    Status  On-going    Target Date  05/24/19      PT SHORT TERM GOAL #3   Title  Patient will increase passive right hip flexion to 105 degrrees    Baseline  pain with end range hip flexion    Time  4    Period  Weeks    Status  On-going    Target Date  05/24/19        PT  Long Term Goals - 04/26/19 1038      PT LONG TERM GOAL #1   Title  Patient will stand for 30 min without self reported increase pain in order to stand at a kitchen sink.    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 We will continue to progress her toewards standing ewxercises. She feels some pain with standing exercises but she was encouraged as to that is why we need to do the,.    Personal Factors and Comorbidities  Comorbidity 1;Comorbidity 2    Comorbidities  MS, OA    Examination-Activity Limitations  Locomotion Level;Carry;Stand;Lift;Stairs;Squat    Examination-Participation Restrictions  Meal Prep;Cleaning;Community Activity;Laundry;Shop    Stability/Clinical Decision Making  Evolving/Moderate complexity    Clinical Decision Making  Moderate    Rehab Potential  Good    PT Frequency  2x / week    PT Duration  8 weeks    PT Treatment/Interventions  ADLs/Self Care Home Management;Electrical Stimulation;Cryotherapy;Iontophoresis 4mg /ml Dexamethasone;Moist Heat;Traction;DME Instruction;Neuromuscular re-education;Patient/family education;Manual techniques;Passive range of motion;Taping    PT Next Visit Plan  continue to progress ther-ex as tolerated; review lifting  technique    PT Home Exercise Plan  LTR; hamstring stretch, tennis ball trigger point release ; abdominal breathing       Patient will benefit from skilled therapeutic intervention in order to improve the following deficits and  impairments:  Abnormal gait, Difficulty walking, Decreased range of motion, Decreased mobility, Decreased strength, Postural dysfunction, Pain, Increased muscle spasms  Visit Diagnosis: Chronic bilateral low back pain without sciatica  Muscle weakness (generalized)  Cramp and spasm  Difficulty in walking, not elsewhere classified     Problem List Patient Active Problem List   Diagnosis Date Noted  . Essential hypertension 08/25/2018  . Abnormal SPEP 02/19/2018  . Hand pain 02/20/2017  . Multiple joint pain 02/18/2017  . Right elbow pain 12/09/2016  . Disturbed cognition 06/25/2016  . Trochanteric bursitis of left hip 02/18/2016  . Sciatica, right side 10/30/2015  . Bilateral arm pain 10/10/2015  . Trochanteric bursitis of right hip 08/04/2014  . Adjustment disorder with mixed anxiety and depressed mood 08/04/2014  . Chronic fatigue 08/04/2014  . Urinary frequency 08/04/2014  . Abnormality of gait 08/04/2014  . Unspecified visual disturbance 01/31/2013  . Transient vision disturbance 01/31/2013  . Routine general medical examination at a health care facility 11/04/2011  . Colon polyps 11/03/2011  . Hot flashes, menopausal 10/13/2011  . POSTHERPETIC NEURALGIA 10/03/2009  . DISPLCMT LUMBAR INTERVERT [place] W/O MYELOPATHY 09/05/2009  . RENAL CALCULUS, RECURRENT 09/04/2009  . HYPERLIPIDEMIA 08/31/2009  . MULTIPLE SCLEROSIS, PROGRESSIVE/RELAPSING 08/31/2009  . ALLERGIC RHINITIS 08/31/2009    Carney Living  PT DPT  06/30/2019, 12:42 PM  [facility] [address] [place], [place], [address] Phone: [phone]   Fax:  [phone]  Name: ANNISTYN PALE MRN: [id number] Date of Birth: 07/09/1946

## 2019-07-01 ENCOUNTER — Other Ambulatory Visit: Payer: Self-pay | Admitting: Neurology

## 2019-07-07 ENCOUNTER — Ambulatory Visit: Payer: Medicare Other | Admitting: Physical Therapy

## 2019-07-12 ENCOUNTER — Encounter: Payer: Self-pay | Admitting: Physical Therapy

## 2019-07-12 ENCOUNTER — Other Ambulatory Visit: Payer: Self-pay

## 2019-07-12 ENCOUNTER — Ambulatory Visit: Payer: Medicare Other | Admitting: Physical Therapy

## 2019-07-12 DIAGNOSIS — G8929 Other chronic pain: Secondary | ICD-10-CM

## 2019-07-12 DIAGNOSIS — R252 Cramp and spasm: Secondary | ICD-10-CM

## 2019-07-12 DIAGNOSIS — M6281 Muscle weakness (generalized): Secondary | ICD-10-CM

## 2019-07-12 DIAGNOSIS — M545 Low back pain: Secondary | ICD-10-CM | POA: Diagnosis not present

## 2019-07-12 DIAGNOSIS — R262 Difficulty in walking, not elsewhere classified: Secondary | ICD-10-CM

## 2019-07-12 NOTE — Therapy (Signed)
[place], [place], [address] Phone: [phone]   Fax:  [phone]  Physical Therapy Treatment  Patient Details  Name: Dawn [name] MRN: [id number] Date of Birth: 07-Aug-1945 Referring Provider (PT): Dr Christell Constant   Encounter Date: 07/12/2019  PT End of Session - 07/12/19 1455    Visit Number  12    Number of Visits  22    Date for PT Re-Evaluation  08/05/19    Authorization Type  Medicare Kx Modifier on visit 13 proress not on 10th visit    PT Start Time  1015    PT Stop Time  1056    PT Time Calculation (min)  41 min    Activity Tolerance  Patient tolerated treatment well    Behavior During Therapy  [facility] for tasks assessed/performed       Past Medical History:  Diagnosis Date  . Allergy   . Essential hypertension 08/25/2018  . Hot flashes, menopausal 10/13/2011   Estradiol started   . Multiple sclerosis ([place])   . Neuropathy   . Osteoarthritis   . Vision abnormalities     Past Surgical History:  Procedure Laterality Date  . ABDOMINAL HYSTERECTOMY  2002   partial  . LUMBAR FUSION      There were no vitals filed for this visit.  Subjective Assessment - 07/12/19 1022    Subjective  Patient reports her ack and hip have been doing well. She has been able to manage her hip and back with the tennis ball and stretching. She has been working on her stretching. She feels like her balance has been off.    How long can you stand comfortably?  hurts as soon as she stands. Has to shift frequently    How long can you walk comfortably?  limited community ambualtion    Diagnostic tests  Nothign recent    Currently in Pain?  Yes    Pain Score  3     Pain Location  Hip    Pain Orientation  Right    Pain Descriptors / Indicators  Aching    Pain Type  Chronic pain    Pain Onset  More than a month ago    Pain Frequency  Intermittent    Aggravating Factors   standing and walking    Pain Relieving  Factors  heating pad; stretches    Effect of Pain on Daily Activities  difficulty performing ADL's                       [facility] Adult PT Treatment/Exercise - 07/12/19 0001      High Level Balance   High Level Balance Comments  forward and backwards rocking with balance catch cx20; narrow base with supervision 3x30 sec hold; narrow base eyes closed 3x30 sec hold with mod a at times for balance. lost balance to the right.       Lumbar Exercises: Stretches   Active Hamstring Stretch  3 reps;20 seconds    Lower Trunk Rotation Limitations  x10 with cuing for technique     Piriformis Stretch Limitations  2x20 sec hold bilateral       Lumbar Exercises: Standing   Other Standing Lumbar Exercises  standing march with min cuing       Lumbar Exercises: Seated   LAQ on Chair Limitations  2x10 yellow band              PT Education -  07/12/19 1454    Education Details  HEP and symptom mangement    Person(s) Educated  Patient    Methods  Explanation;Tactile cues;Demonstration;Verbal cues    Comprehension  Verbalized understanding;Returned demonstration;Verbal cues required;Tactile cues required       PT Short Term Goals - 06/24/19 0751      PT SHORT TERM GOAL #1   Title  Patient will report no pain radiating into her hip and anterior thigh    Baseline  105    Time  4    Period  Weeks    Status  On-going      PT SHORT TERM GOAL #2   Title  Patient will increase bilateral LE strength to 4/5    Baseline  4/5 bilateral    Time  4    Period  Weeks    Status  On-going    Target Date  05/24/19      PT SHORT TERM GOAL #3   Title  Patient will increase passive right hip flexion to 105 degrrees    Baseline  pain with end range hip flexion    Time  4    Period  Weeks    Status  On-going    Target Date  05/24/19        PT Long Term Goals - 04/26/19 1038      PT LONG TERM GOAL #1   Title  Patient will stand for 30 min without self reported increase pain in order  to stand at a kitchen sink.    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 She required assist for narrow base wiht eyes closed. Patient also worked on posterior balance. She tolerated treatment well. She had spasming in her bilateral IT band. therapy showed her how how to perform It band roll out at home with a roller. She is making goo dprogress but would benefit from further intervietion for balance exercises.    Personal Factors and Comorbidities  Comorbidity 1;Comorbidity 2    Comorbidities  MS, OA    Examination-Activity Limitations  Locomotion Level;Carry;Stand;Lift;Stairs;Squat    Examination-Participation Restrictions  Meal Prep;Cleaning;Community Activity;Laundry;Shop    Stability/Clinical Decision Making  Evolving/Moderate complexity    Clinical Decision Making  Moderate    Rehab Potential  Good    PT Frequency  2x / week    PT Duration  8 weeks    PT Treatment/Interventions  ADLs/Self Care Home Management;Electrical Stimulation;Cryotherapy;Iontophoresis 4mg /ml Dexamethasone;Moist Heat;Traction;DME Instruction;Neuromuscular re-education;Patient/family education;Manual techniques;Passive range of motion;Taping    PT Next Visit Plan  continue to progress ther-ex as tolerated; review lifting technique    PT  Home Exercise Plan  LTR; hamstring stretch, tennis ball trigger point release ; abdominal breathing    Consulted and Agree with Plan of Care  Patient       Patient will  benefit from skilled therapeutic intervention in order to improve the following deficits and impairments:  Abnormal gait, Difficulty walking, Decreased range of motion, Decreased mobility, Decreased strength, Postural dysfunction, Pain, Increased muscle spasms  Visit Diagnosis: Chronic bilateral low back pain without sciatica  Muscle weakness (generalized)  Cramp and spasm  Difficulty in walking, not elsewhere classified     Problem List Patient Active Problem List   Diagnosis Date Noted  . Essential hypertension 08/25/2018  . Abnormal SPEP 02/19/2018  . Hand pain 02/20/2017  . Multiple joint pain 02/18/2017  . Right elbow pain 12/09/2016  . Disturbed cognition 06/25/2016  . Trochanteric bursitis of left hip 02/18/2016  . Sciatica, right side 10/30/2015  . Bilateral arm pain 10/10/2015  . Trochanteric bursitis of right hip 08/04/2014  . Adjustment disorder with mixed anxiety and depressed mood 08/04/2014  . Chronic fatigue 08/04/2014  . Urinary frequency 08/04/2014  . Abnormality of gait 08/04/2014  . Unspecified visual disturbance 01/31/2013  . Transient vision disturbance 01/31/2013  . Routine general medical examination at a health care facility 11/04/2011  . Colon polyps 11/03/2011  . Hot flashes, menopausal 10/13/2011  . POSTHERPETIC NEURALGIA 10/03/2009  . DISPLCMT LUMBAR INTERVERT [place] W/O MYELOPATHY 09/05/2009  . RENAL CALCULUS, RECURRENT 09/04/2009  . HYPERLIPIDEMIA 08/31/2009  . MULTIPLE SCLEROSIS, PROGRESSIVE/RELAPSING 08/31/2009  . ALLERGIC RHINITIS 08/31/2009    Carney Living PT DPT 07/12/2019, 3:01 PM  [facility] [address] [place], [place], [address] Phone: [phone]   Fax:  [phone]  Name: AALISHA ROEHRIG MRN: [id number] Date of Birth: January 29, 1946

## 2019-07-21 ENCOUNTER — Other Ambulatory Visit: Payer: Self-pay | Admitting: Neurology

## 2019-07-22 ENCOUNTER — Other Ambulatory Visit: Payer: Self-pay | Admitting: Neurology

## 2019-07-22 DIAGNOSIS — I10 Essential (primary) hypertension: Secondary | ICD-10-CM

## 2019-07-30 ENCOUNTER — Other Ambulatory Visit: Payer: Self-pay | Admitting: Neurology

## 2019-07-30 DIAGNOSIS — I10 Essential (primary) hypertension: Secondary | ICD-10-CM

## 2019-08-03 ENCOUNTER — Ambulatory Visit: Payer: Medicare Other | Admitting: Physical Therapy

## 2019-08-03 DIAGNOSIS — Z20828 Contact with and (suspected) exposure to other viral communicable diseases: Secondary | ICD-10-CM | POA: Diagnosis not present

## 2019-08-05 ENCOUNTER — Telehealth: Payer: Self-pay | Admitting: Neurology

## 2019-08-05 NOTE — Telephone Encounter (Signed)
Pt would like a call from RN to discuss if Dr Felecia Shelling feels she is a canidate for the Covid-19 vaccine,

## 2019-08-08 NOTE — Telephone Encounter (Signed)
Called and spoke with pt. Advised Dr. Felecia Shelling is okay with her getting either the [place] or [place] covid-19 vaccine. She verbalized understanding. Pt is scheduled to get hers on 09/01/19.

## 2019-08-10 ENCOUNTER — Encounter: Payer: Self-pay | Admitting: Physical Therapy

## 2019-08-10 ENCOUNTER — Ambulatory Visit: Payer: Medicare Other | Attending: Neurology | Admitting: Physical Therapy

## 2019-08-10 ENCOUNTER — Other Ambulatory Visit: Payer: Self-pay

## 2019-08-10 DIAGNOSIS — G8929 Other chronic pain: Secondary | ICD-10-CM

## 2019-08-10 DIAGNOSIS — R262 Difficulty in walking, not elsewhere classified: Secondary | ICD-10-CM

## 2019-08-10 DIAGNOSIS — M545 Low back pain: Secondary | ICD-10-CM | POA: Insufficient documentation

## 2019-08-10 DIAGNOSIS — R252 Cramp and spasm: Secondary | ICD-10-CM | POA: Insufficient documentation

## 2019-08-10 DIAGNOSIS — M6281 Muscle weakness (generalized): Secondary | ICD-10-CM | POA: Insufficient documentation

## 2019-08-11 ENCOUNTER — Encounter: Payer: Self-pay | Admitting: Physical Therapy

## 2019-08-11 NOTE — Therapy (Signed)
[place], [place], [address] Phone: [phone]   Fax:  [phone]  Physical Therapy Treatment/Re-cert   Patient Details  Name: Dawn [name] MRN: [id number] Date of Birth: 12-19-1945 Referring Provider (PT): Dr Christell Constant   Encounter Date: 08/10/2019  PT End of Session - 08/10/19 1158    Visit Number  13    Number of Visits  21    Date for PT Re-Evaluation  10/06/19    Authorization Type  Medicare Kx Modifier on visit 13 proress not on 10th visit    PT Start Time  1150    PT Stop Time  1230    PT Time Calculation (min)  40 min    Activity Tolerance  Patient tolerated treatment well    Behavior During Therapy  [facility] for tasks assessed/performed       Past Medical History:  Diagnosis Date  . Allergy   . Essential hypertension 08/25/2018  . Hot flashes, menopausal 10/13/2011   Estradiol started   . Multiple sclerosis ([place])   . Neuropathy   . Osteoarthritis   . Vision abnormalities     Past Surgical History:  Procedure Laterality Date  . ABDOMINAL HYSTERECTOMY  2002   partial  . LUMBAR FUSION      There were no vitals filed for this visit.  Subjective Assessment - 08/10/19 1154    Subjective  Patients reports her right leg has been sore. She has been working on her stretches and exercises. She has not been able to come in for a few weeks.    Pertinent History  MS    How long can you stand comfortably?  hurts as soon as she stands. Has to shift frequently    How long can you walk comfortably?  limited community ambualtion    Diagnostic tests  Nothign recent    Currently in Pain?  Yes    Pain Score  5     Pain Location  Hip    Pain Orientation  Right    Pain Descriptors / Indicators  Aching    Pain Type  Chronic pain    Pain Onset  More than a month ago    Pain Frequency  Intermittent    Aggravating Factors   standing and walking    Pain Relieving Factors  heating pad    Effect of  Pain on Daily Activities  difficulty perfroming         [facility] PT Assessment - 08/11/19 0001      AROM   Lumbar Flexion  65    Lumbar Extension  20      Strength   Right Hip Flexion  4+/5    Right Hip ABduction  4/5    Right Hip ADduction  4/5    Left Hip Flexion  4+/5    Left Hip ABduction  4/5    Left Hip ADduction  4/5                   [facility] Adult PT Treatment/Exercise - 08/11/19 0001      High Level Balance   High Level Balance Comments  forward and backwards rocking with balance catch cx20; narrow base with supervision 3x30 sec hold; narrow base eyes closed 3x30 sec hold with mod a at times for balance. lost balance to the right.       Lumbar Exercises: Stretches   Active Hamstring Stretch  3 reps;20 seconds    Lower  Trunk Rotation Limitations  x10 with cuing for technique     Piriformis Stretch Limitations  2x20 sec hold bilateral       Lumbar Exercises: Standing   Heel Raises Limitations  2x10              PT Education - 08/11/19 0942    Education Details  HEP and symptom mangement    Person(s) Educated  Patient    Methods  Explanation;Tactile cues;Verbal cues;Demonstration    Comprehension  Verbalized understanding;Returned demonstration;Tactile cues required;Verbal cues required       PT Short Term Goals - 08/11/19 1201      PT SHORT TERM GOAL #1   Title  Patient will report no pain radiating into anterior thigh but still pain in the hip    Baseline  improved radiating pain    Time  4    Period  Weeks    Status  Partially Met    Target Date  05/24/19      PT SHORT TERM GOAL #2   Title  Patient will increase bilateral LE strength to 4/5    Baseline  4/5 gross    Time  4    Period  Weeks    Status  Achieved      PT SHORT TERM GOAL #3   Title  Patient will increase passive right hip flexion to 105 degrrees    Baseline  100    Time  4    Period  Weeks    Status  On-going    Target Date  05/24/19        PT Long Term Goals -  08/11/19 1201      PT LONG TERM GOAL #1   Title  Patient will stand for 30 min without self reported increase pain in order to stand at a kitchen sink.    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 She has been able to use her exercise program despite a few weeks of not being able to come to therapy. She is still having pain in her right hip. She would benefit from further skilled therapy to improve hip strength and decrease pain and spasming. She tolerated needling well today.    Personal Factors and Comorbidities  Comorbidity 1;Comorbidity 2    Comorbidities  MS, OA    Examination-Activity Limitations  Locomotion Level;Carry;Stand;Lift;Stairs;Squat      Stability/Clinical Decision Making  Evolving/Moderate complexity    Clinical Decision Making  Moderate    Rehab Potential  Good    PT Duration  8 weeks    PT Treatment/Interventions  ADLs/Self Care Home Management;Electrical Stimulation;Cryotherapy;Iontophoresis 82m/ml Dexamethasone;Moist Heat;Traction;DME Instruction;Neuromuscular re-education;Patient/family education;Manual techniques;Passive range of motion;Taping    PT Next Visit Plan  continue to progress ther-ex as tolerated; review lifting technique    PT Home Exercise Plan  LTR; hamstring  stretch, tennis ball trigger point release ; abdominal breathing    Consulted and Agree with Plan of Care  Patient       Patient will benefit from skilled therapeutic intervention in order to improve the following deficits and impairments:  Abnormal gait, Difficulty walking, Decreased range of motion, Decreased mobility, Decreased strength, Postural dysfunction, Pain, Increased muscle spasms  Visit Diagnosis: Chronic bilateral low back pain without sciatica  Muscle weakness (generalized)  Cramp and spasm  Difficulty in walking, not elsewhere classified     Problem List Patient Active Problem List   Diagnosis Date Noted  . Essential hypertension 08/25/2018  . Abnormal SPEP 02/19/2018  . Hand pain 02/20/2017  . Multiple joint pain 02/18/2017  . Right elbow pain 12/09/2016  . Disturbed cognition 06/25/2016  . Trochanteric bursitis of left hip 02/18/2016  . Sciatica, right side 10/30/2015  . Bilateral arm pain 10/10/2015  . Trochanteric bursitis of right hip 08/04/2014  . Adjustment disorder with mixed anxiety and depressed mood 08/04/2014  . Chronic fatigue 08/04/2014  . Urinary frequency 08/04/2014  . Abnormality of gait 08/04/2014  . Unspecified visual disturbance 01/31/2013  . Transient vision disturbance 01/31/2013  . Routine general medical examination at a health care facility 11/04/2011  . Colon polyps 11/03/2011  . Hot flashes, menopausal 10/13/2011  . POSTHERPETIC NEURALGIA 10/03/2009  . DISPLCMT LUMBAR INTERVERT [place] MYELOPATH[place] 09/05/2009  . RENAL CALCULUS, RECURRENT 09/04/2009  . HYPERLIPIDEMIA 08/31/2009  . MULTIPLE SCLEROSIS, PROGRESSIVE/RELAPSING 08/31/2009  . ALLERGIC RHINITIS 08/31/2009    DCarney LivingPT DPT  08/11/2019, 12:15 PM  [facility] [address]Phone: [phone]  Fax:  [phone] Name: Dawn HALLECKMRN: [id number] of Birth:  [birth year]

## 2019-08-16 ENCOUNTER — Telehealth: Payer: Self-pay | Admitting: Internal Medicine

## 2019-08-16 NOTE — Telephone Encounter (Signed)
LVM to see if patient is still wanting to be seen here.

## 2019-08-17 ENCOUNTER — Ambulatory Visit: Payer: Medicare Other | Admitting: Physical Therapy

## 2019-08-17 ENCOUNTER — Encounter: Payer: Self-pay | Admitting: Physical Therapy

## 2019-08-17 ENCOUNTER — Other Ambulatory Visit: Payer: Self-pay

## 2019-08-17 DIAGNOSIS — R262 Difficulty in walking, not elsewhere classified: Secondary | ICD-10-CM | POA: Diagnosis not present

## 2019-08-17 DIAGNOSIS — M545 Low back pain: Secondary | ICD-10-CM | POA: Diagnosis not present

## 2019-08-17 DIAGNOSIS — G8929 Other chronic pain: Secondary | ICD-10-CM

## 2019-08-17 DIAGNOSIS — M6281 Muscle weakness (generalized): Secondary | ICD-10-CM

## 2019-08-17 DIAGNOSIS — R252 Cramp and spasm: Secondary | ICD-10-CM | POA: Diagnosis not present

## 2019-08-18 ENCOUNTER — Encounter: Payer: Self-pay | Admitting: Physical Therapy

## 2019-08-18 NOTE — Therapy (Signed)
[place], [place], [address] Phone: [phone]   Fax:  [phone]  Physical Therapy Treatment  Patient Details  Name: Dawn [name] MRN: [id number] Date of Birth: [birth year] Referring Provider (PT): Dr Christell Constant   Encounter Date: 08/17/2019  PT End of Session - 08/17/19 1147    Visit Number  14    Number of Visits  21    Date for PT Re-Evaluation  10/06/19    Authorization Type  Medicare Kx Modifier on visit 13 proress not on 10th visit    PT Start Time  1145    PT Stop Time  1225    PT Time Calculation (min)  40 min    Activity Tolerance  Patient tolerated treatment well    Behavior During Therapy  [facility] for tasks assessed/performed       Past Medical History:  Diagnosis Date  . Allergy   . Essential hypertension 08/25/2018  . Hot flashes, menopausal 10/13/2011   Estradiol started   . Multiple sclerosis ([place])   . Neuropathy   . Osteoarthritis   . Vision abnormalities     Past Surgical History:  Procedure Laterality Date  . ABDOMINAL HYSTERECTOMY  2002   partial  . LUMBAR FUSION      There were no vitals filed for this visit.  Subjective Assessment - 08/17/19 1146    Subjective  Patient reports her right hip is a little better but it is still sore. She isstill having some pain in the forearm.    Pertinent History  MS    How long can you stand comfortably?  hurts as soon as she stands. Has to shift frequently    How long can you walk comfortably?  limited community ambualtion    Diagnostic tests  Nothign recent    Currently in Pain?  Yes    Pain Score  5     Pain Location  Hip    Pain Orientation  Right    Pain Descriptors / Indicators  Aching    Pain Type  Chronic pain    Pain Onset  More than a month ago    Pain Frequency  Intermittent    Aggravating Factors   standing and walking    Pain Relieving Factors  heating pad    Effect of Pain on Daily Activities  difficulty  perfroming ADL's                       [facility] Adult PT Treatment/Exercise - 08/18/19 0001      Neuro Re-ed    Neuro Re-ed Details   standing EC with narrow BOS x30sec; tandem stance bil 3x30sec with pt requirin min assist to regain balance with left foot fwd and right foot behind with EO mod cuing       Lumbar Exercises: Stretches   Active Hamstring Stretch  3 reps;20 seconds    Lower Trunk Rotation Limitations  x10 with cuing for technique       Lumbar Exercises: Standing   Heel Raises Limitations  2x10     Other Standing Lumbar Exercises  standing march with min cuing       Lumbar Exercises: Seated   LAQ on Chair Limitations  2x10 yellow band       Manual Therapy   Manual therapy comments  skilled palpation of trigger points     Joint Mobilization  PA glides from L2 to L5  Soft tissue mobilization  to lumbar spine and upper gluteals bilateral        Trigger Point Dry Needling - 08/18/19 0001    Consent Given?  Yes    Dry Needling Comments  3 needles in the right multifidi     Lumbar multifidi Response  Twitch response elicited;Palpable increased muscle length           PT Education - 08/18/19 1258    Education Details  reviewed balance exercises, updated HEP    Person(s) Educated  Patient    Methods  Explanation;Demonstration;Tactile cues;Verbal cues    Comprehension  Returned demonstration;Verbalized understanding;Verbal cues required;Tactile cues required       PT Short Term Goals - 08/18/19 1301      PT SHORT TERM GOAL #1   Title  Patient will report no pain radiating into anterior thigh but still pain in the hip    Baseline  improved radiating pain    Time  4    Period  Weeks    Status  On-going    Target Date  05/24/19      PT SHORT TERM GOAL #2   Title  Patient will increase bilateral LE strength to 4/5    Baseline  4/5 gross    Time  4    Period  Weeks    Status  Achieved    Target Date  05/24/19      PT SHORT TERM GOAL #3    Title  Patient will increase passive right hip flexion to 105 degrrees    Baseline  100    Time  4    Period  Weeks    Status  On-going    Target Date  05/24/19        PT Long Term Goals - 08/11/19 1201      PT LONG TERM GOAL #1   Title  Patient will stand for 30 min without self reported increase pain in order to stand at a kitchen sink.    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 She required min/mod assist to maintain balance with tandem stance. He rnarrow base eyes closed has imporved. She is feeling more comfortable with her balance exercises at home. Therapy needled her lumbar spine. She had a great twtich response.    Personal Factors and Comorbidities  Comorbidity 1;Comorbidity 2    Comorbidities  MS, OA    Examination-Activity  Limitations  Locomotion Level;Carry;Stand;Lift;Stairs;Squat    Examination-Participation Restrictions  Meal Prep;Cleaning;Community Activity;Laundry;Shop    Stability/Clinical Decision Making  Evolving/Moderate complexity    Clinical Decision Making  Moderate    Rehab Potential  Good    PT Frequency  2x / week    PT Duration  8 weeks    PT Treatment/Interventions  ADLs/Self Care Home Management;Electrical Stimulation;Cryotherapy;Iontophoresis 4mg /ml Dexamethasone;Moist Heat;Traction;DME  Instruction;Neuromuscular re-education;Patient/family education;Manual techniques;Passive range of motion;Taping    PT Next Visit Plan  continue to progress ther-ex as tolerated; review lifting technique    PT Home Exercise Plan  LTR; hamstring stretch, tennis ball trigger point release ; abdominal breathing    Consulted and Agree with Plan of Care  Patient       Patient will benefit from skilled therapeutic intervention in order to improve the following deficits and impairments:  Abnormal gait, Difficulty walking, Decreased range of motion, Decreased mobility, Decreased strength, Postural dysfunction, Pain, Increased muscle spasms  Visit Diagnosis: Chronic bilateral low back pain without sciatica  Muscle weakness (generalized)  Cramp and spasm  Difficulty in walking, not elsewhere classified     Problem List Patient Active Problem List   Diagnosis Date Noted  . Essential hypertension 08/25/2018  . Abnormal SPEP 02/19/2018  . Hand pain 02/20/2017  . Multiple joint pain 02/18/2017  . Right elbow pain 12/09/2016  . Disturbed cognition 06/25/2016  . Trochanteric bursitis of left hip 02/18/2016  . Sciatica, right side 10/30/2015  . Bilateral arm pain 10/10/2015  . Trochanteric bursitis of right hip 08/04/2014  . Adjustment disorder with mixed anxiety and depressed mood 08/04/2014  . Chronic fatigue 08/04/2014  . Urinary frequency 08/04/2014  . Abnormality of gait 08/04/2014  . Unspecified visual disturbance 01/31/2013  . Transient vision disturbance 01/31/2013  . Routine general medical examination at a health care facility 11/04/2011  . Colon polyps 11/03/2011  . Hot flashes, menopausal 10/13/2011  . POSTHERPETIC NEURALGIA 10/03/2009  . DISPLCMT LUMBAR INTERVERT [place] W/O MYELOPATHY 09/05/2009  . RENAL CALCULUS, RECURRENT 09/04/2009  . HYPERLIPIDEMIA 08/31/2009  . MULTIPLE SCLEROSIS, PROGRESSIVE/RELAPSING 08/31/2009  . ALLERGIC RHINITIS 08/31/2009    Carney Living PT  DPT  08/18/2019, 1:09 PM  [facility] [address] [place], [place], [address] Phone: [phone]   Fax:  [phone]  Name: MISCHA CARNETT MRN: [id number] Date of Birth: [birth year]

## 2019-08-21 ENCOUNTER — Ambulatory Visit: Payer: Medicare Other

## 2019-08-24 ENCOUNTER — Other Ambulatory Visit: Payer: Self-pay

## 2019-08-24 ENCOUNTER — Ambulatory Visit: Payer: Medicare Other | Attending: Neurology | Admitting: Physical Therapy

## 2019-08-24 DIAGNOSIS — M6281 Muscle weakness (generalized): Secondary | ICD-10-CM | POA: Insufficient documentation

## 2019-08-24 DIAGNOSIS — R262 Difficulty in walking, not elsewhere classified: Secondary | ICD-10-CM

## 2019-08-24 DIAGNOSIS — M545 Low back pain, unspecified: Secondary | ICD-10-CM

## 2019-08-24 DIAGNOSIS — R252 Cramp and spasm: Secondary | ICD-10-CM | POA: Diagnosis not present

## 2019-08-24 DIAGNOSIS — G8929 Other chronic pain: Secondary | ICD-10-CM | POA: Diagnosis not present

## 2019-08-24 NOTE — Therapy (Signed)
[place], [place], [address] Phone: [phone]   Fax:  [phone]  Physical Therapy Treatment  Patient Details  Name: Dawn [name] MRN: [id number] Date of Birth: 07-21-1946 Referring Provider (PT): Dr Christell Constant   Encounter Date: 08/24/2019  PT End of Session - 08/24/19 1448    Visit Number  15    Number of Visits  21    Authorization Type  Medicare Kx Modifier on visit 13 proress not on 10th visit    PT Start Time  1145    PT Stop Time  1225    PT Time Calculation (min)  40 min    Activity Tolerance  Patient tolerated treatment well    Behavior During Therapy  [facility] for tasks assessed/performed       Past Medical History:  Diagnosis Date  . Allergy   . Essential hypertension 08/25/2018  . Hot flashes, menopausal 10/13/2011   Estradiol started   . Multiple sclerosis ([place])   . Neuropathy   . Osteoarthritis   . Vision abnormalities     Past Surgical History:  Procedure Laterality Date  . ABDOMINAL HYSTERECTOMY  2002   partial  . LUMBAR FUSION      There were no vitals filed for this visit.  Subjective Assessment - 08/24/19 1443    Subjective  Patient reports her hip and back pain levels are high today. She is also having pain inher hands and upper back. She reports it is likely her MS and the cold weather. She has been in pain since sunday.    Pertinent History  MS    How long can you stand comfortably?  hurts as soon as she stands. Has to shift frequently    How long can you walk comfortably?  limited community ambualtion    Diagnostic tests  Nothign recent    Currently in Pain?  Yes    Pain Score  7     Pain Location  Hip    Pain Orientation  Right    Pain Descriptors / Indicators  Aching    Pain Type  Chronic pain    Pain Radiating Towards  pain in the right hip    Pain Onset  More than a month ago    Pain Frequency  Intermittent    Aggravating Factors   standing and walking     Pain Relieving Factors  heating pad    Effect of Pain on Daily Activities  difficutyl perfroming ADL's                       [facility] Adult PT Treatment/Exercise - 08/24/19 0001      Neuro Re-ed    Neuro Re-ed Details   standing EC with narrow BOS x30sec; tandem stance bil 3x30sec with pt requirin min assist to regain balance with left foot fwd and right foot behind with EO mod cuing       Lumbar Exercises: Stretches   Active Hamstring Stretch  3 reps;20 seconds    Lower Trunk Rotation Limitations  x10 with cuing for technique       Lumbar Exercises: Standing   Other Standing Lumbar Exercises  --      Lumbar Exercises: Seated   LAQ on Chair Limitations  --      Lumbar Exercises: Supine   Clam Limitations  supine yellow band 2x10       Manual Therapy   Manual Therapy  Passive  ROM    Manual therapy comments  skilled palpation of trigger points     Joint Mobilization  PA glides from L2 to L5     Soft tissue mobilization  to lumbar spine and upper gluteals bilateral     Passive ROM  focus on left hip hip flexionIR/ER        Trigger Point Dry Needling - 08/24/19 0001    Dry Needling Comments  2 needles to glut medius 1 to lumbar multifidi     Lumbar multifidi Response  Twitch response elicited           PT Education - 08/24/19 1447    Education Details  HEP and symptom mangement    Person(s) Educated  Patient    Methods  Explanation;Tactile cues;Verbal cues;Demonstration    Comprehension  Verbalized understanding;Returned demonstration;Verbal cues required;Tactile cues required       PT Short Term Goals - 08/24/19 1452      PT SHORT TERM GOAL #1   Title  Patient will report no pain radiating into anterior thigh but still pain in the hip    Baseline  improved radiating pain    Time  4    Period  Weeks    Status  On-going    Target Date  05/24/19      PT SHORT TERM GOAL #2   Title  Patient will increase bilateral LE strength to 4/5    Baseline   4/5 gross    Time  4    Period  Weeks    Status  Achieved    Target Date  05/24/19      PT SHORT TERM GOAL #3   Title  Patient will increase passive right hip flexion to 105 degrrees    Baseline  100    Time  4    Period  Weeks    Status  On-going    Target Date  05/24/19        PT Long Term Goals - 08/11/19 1201      PT LONG TERM GOAL #1   Title  Patient will stand for 30 min without self reported increase pain in order to stand at a kitchen sink.    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 Therapy focused on manual therapy to improve motion and decrease pain. She reported improved pain after treatment. She had a good twtcih respose  with needling. Therapy held on standing exercises today. Therapy will continue to progress standing balance exercises as tolerated.    Examination-Participation Restrictions  Meal Prep;Cleaning;Community Activity;Laundry;Shop    Stability/Clinical Decision Making  Evolving/Moderate complexity    Clinical Decision Making  Moderate    Rehab Potential  Good    PT Frequency  2x / week    PT Duration  8 weeks    PT Treatment/Interventions  ADLs/Self Care Home Management;Electrical Stimulation;Cryotherapy;Iontophoresis 4mg /ml Dexamethasone;Moist Heat;Traction;DME Instruction;Neuromuscular  re-education;Patient/family education;Manual techniques;Passive range of motion;Taping    PT Next Visit Plan  continue to progress ther-ex as tolerated; review lifting technique    PT Home Exercise Plan  LTR; hamstring stretch, tennis ball trigger point release ; abdominal breathing    Consulted and Agree with Plan of Care  Patient       Patient will benefit from skilled therapeutic intervention in order to improve the following deficits and impairments:     Visit Diagnosis: Chronic bilateral low back pain without sciatica  Muscle weakness (generalized)  Cramp and spasm  Difficulty in walking, not elsewhere classified     Problem List Patient Active Problem List   Diagnosis Date Noted  . Essential hypertension 08/25/2018  . Abnormal SPEP 02/19/2018  . Hand pain 02/20/2017  . Multiple joint pain 02/18/2017  . Right elbow pain 12/09/2016  . Disturbed cognition 06/25/2016  . Trochanteric bursitis of left hip 02/18/2016  . Sciatica, right side 10/30/2015  . Bilateral arm pain 10/10/2015  . Trochanteric bursitis of right hip 08/04/2014  . Adjustment disorder with mixed anxiety and depressed mood 08/04/2014  . Chronic fatigue 08/04/2014  . Urinary frequency 08/04/2014  . Abnormality of gait 08/04/2014  . Unspecified visual disturbance 01/31/2013  . Transient vision disturbance 01/31/2013  . Routine general medical examination at a health care facility 11/04/2011  . Colon polyps 11/03/2011  . Hot flashes, menopausal 10/13/2011  . POSTHERPETIC NEURALGIA 10/03/2009  . DISPLCMT LUMBAR INTERVERT [place] W/O MYELOPATHY 09/05/2009  . RENAL CALCULUS, RECURRENT 09/04/2009  . HYPERLIPIDEMIA 08/31/2009  . MULTIPLE SCLEROSIS, PROGRESSIVE/RELAPSING 08/31/2009  . ALLERGIC RHINITIS 08/31/2009    Carney Living PT DPT  08/24/2019, 2:54 PM  [facility] [address] [place], [place], [address] Phone: [phone]   Fax:   [phone]  Name: Dawn [name] MRN: [id number] Date of Birth: Sep 25, 1945

## 2019-08-25 ENCOUNTER — Telehealth: Payer: Self-pay | Admitting: Neurology

## 2019-08-25 NOTE — Telephone Encounter (Signed)
PA initiated on covermymeds ([id number]). Decision pending.

## 2019-08-25 NOTE — Telephone Encounter (Addendum)
Approved through 02/22/2020. [id number]. I called the patient and she is aware of the approval.

## 2019-08-25 NOTE — Telephone Encounter (Signed)
Noted, we will work on this for her.

## 2019-08-25 NOTE — Telephone Encounter (Signed)
Pt called stating she wanted Terrence Dupont, RN to have the # to call for the  PA on her modafinil (PROVIGIL) 200 MG tablet.  Pt also provided her new insurance info: [employer] [id number] RX [id number] RX [id number] RX Group CODE:[id number]

## 2019-08-27 ENCOUNTER — Ambulatory Visit: Payer: Medicare Other | Attending: Internal Medicine

## 2019-08-27 DIAGNOSIS — Z23 Encounter for immunization: Secondary | ICD-10-CM

## 2019-08-27 NOTE — Progress Notes (Signed)
   Covid-19 Vaccination Clinic  Name:  Dawn [name]    MRN: [id number] DOB: 1945/09/16  08/27/2019  [name] was observed post Covid-19 immunization for 15 minutes without incidence. She was provided with Vaccine Information Sheet and instruction to access the V-Safe system.   [name] was instructed to call 911 with any severe reactions post vaccine: [name] Kitchen Difficulty breathing  . Swelling of your face and throat  . A fast heartbeat  . A bad rash all over your body  . Dizziness and weakness    Immunizations Administered    Name Date Dose VIS Date Route   Pfizer COVID-19 Vaccine 08/27/2019  5:28 PM 0.3 mL 07/01/2019 Intramuscular   Manufacturer: [place]   Lot: [id number]   [place]: [id number]

## 2019-08-30 ENCOUNTER — Encounter: Payer: Self-pay | Admitting: Family

## 2019-08-30 ENCOUNTER — Other Ambulatory Visit: Payer: Self-pay

## 2019-08-30 ENCOUNTER — Telehealth (INDEPENDENT_AMBULATORY_CARE_PROVIDER_SITE_OTHER): Payer: Medicare Other | Admitting: Family

## 2019-08-30 DIAGNOSIS — Z1159 Encounter for screening for other viral diseases: Secondary | ICD-10-CM

## 2019-08-30 DIAGNOSIS — Z0000 Encounter for general adult medical examination without abnormal findings: Secondary | ICD-10-CM | POA: Diagnosis not present

## 2019-08-30 DIAGNOSIS — G35 Multiple sclerosis: Secondary | ICD-10-CM

## 2019-08-30 NOTE — Patient Instructions (Signed)
Dawn [name] , Thank you for taking time to come for your Medicare Wellness Visit. I appreciate your ongoing commitment to your health goals. Please review the following plan we discussed and let me know if I can assist you in the future.   Screening recommendations/referrals: Colonoscopy: Up to date due 09/28/2021   Mammogram: : Up to date Bone Density: Due  Recommended yearly ophthalmology/optometry visit for glaucoma screening and checkup Recommended yearly dental visit for hygiene and checkup  Vaccinations: Influenza vaccine : Up to date Pneumococcal vaccine: Your final Pneumococcal 23 vaccine due. Tdap vaccine : Up to date 12/05/2019  Shingles vaccine: Please get vaccine at your pharmacy once you have completed your COVID-19 vaccine after one  month    Advanced directives: yes   Conditions/risks identified:Advance age female > 76 yrs,Hypertension,Obesity BMI > 30,Hx of smoking,Family Hx of premature Cardiovascular disease   Next appointment: 1 year   Preventive Care 24 Years and Older, Female Preventive care refers to lifestyle choices and visits with your health care provider that can promote health and wellness. What does preventive care include?  A yearly physical exam. This is also called an annual well check.  Dental exams once or twice a year.  Routine eye exams. Ask your health care provider how often you should have your eyes checked.  Personal lifestyle choices, including:  Daily care of your teeth and gums.  Regular physical activity.  Eating a healthy diet.  Avoiding tobacco and drug use.  Limiting alcohol use.  Practicing safe sex.  Taking low-dose aspirin every day.  Taking vitamin and mineral supplements as recommended by your health care provider. What happens during an annual well check? The services and screenings done by your health care provider during your annual well check will depend on your age, overall health, lifestyle risk factors, and  family history of disease. Counseling  Your health care provider may ask you questions about your:  Alcohol use.  Tobacco use.  Drug use.  Emotional well-being.  Home and relationship well-being.  Sexual activity.  Eating habits.  History of falls.  Memory and ability to understand (cognition).  Work and work Statistician.  Reproductive health. Screening  You may have the following tests or measurements:  Height, weight, and BMI.  Blood pressure.  Lipid and cholesterol levels. These may be checked every 5 years, or more frequently if you are over 20 years old.  Skin check.  Lung cancer screening. You may have this screening every year starting at age 25 if you have a 30-pack-year history of smoking and currently smoke or have quit within the past 15 years.  Fecal occult blood test (FOBT) of the stool. You may have this test every year starting at age 29.  Flexible sigmoidoscopy or colonoscopy. You may have a sigmoidoscopy every 5 years or a colonoscopy every 10 years starting at age 13.  Hepatitis C blood test.  Hepatitis B blood test.  Sexually transmitted disease (STD) testing.  Diabetes screening. This is done by checking your blood sugar (glucose) after you have not eaten for a while (fasting). You may have this done every 1-3 years.  Bone density scan. This is done to screen for osteoporosis. You may have this done starting at age 55.  Mammogram. This may be done every 1-2 years. Talk to your health care provider about how often you should have regular mammograms. Talk with your health care provider about your test results, treatment options, and if necessary, the need for more  tests. Vaccines  Your health care provider may recommend certain vaccines, such as:  Influenza vaccine. This is recommended every year.  Tetanus, diphtheria, and acellular pertussis (Tdap, Td) vaccine. You may need a Td booster every 10 years.  Zoster vaccine. You may need this  after age 55.  Pneumococcal 13-valent conjugate (PCV13) vaccine. One dose is recommended after age 66.  Pneumococcal polysaccharide (PPSV23) vaccine. One dose is recommended after age 9. Talk to your health care provider about which screenings and vaccines you need and how often you need them. This information is not intended to replace advice given to you by your health care provider. Make sure you discuss any questions you have with your health care provider. Document Released: 08/03/2015 Document Revised: 03/26/2016 Document Reviewed: 05/08/2015 Elsevier Interactive Patient Education  2017 [place] Prevention in the Home Falls can cause injuries. They can happen to people of all ages. There are many things you can do to make your home safe and to help prevent falls. What can I do on the outside of my home?  Regularly fix the edges of walkways and driveways and fix any cracks.  Remove anything that might make you trip as you walk through a door, such as a raised step or threshold.  Trim any bushes or trees on the path to your home.  Use bright outdoor lighting.  Clear any walking paths of anything that might make someone trip, such as rocks or tools.  Regularly check to see if handrails are loose or broken. Make sure that both sides of any steps have handrails.  Any raised decks and porches should have guardrails on the edges.  Have any leaves, snow, or ice cleared regularly.  Use sand or salt on walking paths during winter.  Clean up any spills in your garage right away. This includes oil or grease spills. What can I do in the bathroom?  Use night lights.  Install grab bars by the toilet and in the tub and shower. Do not use towel bars as grab bars.  Use non-skid mats or decals in the tub or shower.  If you need to sit down in the shower, use a plastic, non-slip stool.  Keep the floor dry. Clean up any water that spills on the floor as soon as it  happens.  Remove soap buildup in the tub or shower regularly.  Attach bath mats securely with double-sided non-slip rug tape.  Do not have throw rugs and other things on the floor that can make you trip. What can I do in the bedroom?  Use night lights.  Make sure that you have a light by your bed that is easy to reach.  Do not use any sheets or blankets that are too big for your bed. They should not hang down onto the floor.  Have a firm chair that has side arms. You can use this for support while you get dressed.  Do not have throw rugs and other things on the floor that can make you trip. What can I do in the kitchen?  Clean up any spills right away.  Avoid walking on wet floors.  Keep items that you use a lot in easy-to-reach places.  If you need to reach something above you, use a strong step stool that has a grab bar.  Keep electrical cords out of the way.  Do not use floor polish or wax that makes floors slippery. If you must use wax, use non-skid floor  wax.  Do not have throw rugs and other things on the floor that can make you trip. What can I do with my stairs?  Do not leave any items on the stairs.  Make sure that there are handrails on both sides of the stairs and use them. Fix handrails that are broken or loose. Make sure that handrails are as long as the stairways.  Check any carpeting to make sure that it is firmly attached to the stairs. Fix any carpet that is loose or worn.  Avoid having throw rugs at the top or bottom of the stairs. If you do have throw rugs, attach them to the floor with carpet tape.  Make sure that you have a light switch at the top of the stairs and the bottom of the stairs. If you do not have them, ask someone to add them for you. What else can I do to help prevent falls?  Wear shoes that:  Do not have high heels.  Have rubber bottoms.  Are comfortable and fit you well.  Are closed at the toe. Do not wear sandals.  If you  use a stepladder:  Make sure that it is fully opened. Do not climb a closed stepladder.  Make sure that both sides of the stepladder are locked into place.  Ask someone to hold it for you, if possible.  Clearly mark and make sure that you can see:  Any grab bars or handrails.  First and last steps.  Where the edge of each step is.  Use tools that help you move around (mobility aids) if they are needed. These include:  Canes.  Walkers.  Scooters.  Crutches.  Turn on the lights when you go into a dark area. Replace any light bulbs as soon as they burn out.  Set up your furniture so you have a clear path. Avoid moving your furniture around.  If any of your floors are uneven, fix them.  If there are any pets around you, be aware of where they are.  Review your medicines with your doctor. Some medicines can make you feel dizzy. This can increase your chance of falling. Ask your doctor what other things that you can do to help prevent falls. This information is not intended to replace advice given to you by your health care provider. Make sure you discuss any questions you have with your health care provider. Document Released: 05/03/2009 Document Revised: 12/13/2015 Document Reviewed: 08/11/2014 Elsevier Interactive Patient Education  2017 [employer].

## 2019-08-30 NOTE — Progress Notes (Signed)
Subjective:   Dawn [name] is a 74 y.o. female who presents for Medicare Annual (Subsequent) preventive examination.  Review of Systems:  Cardiac Risk Factors include: advanced age (>91mn, >>78women);hypertension;obesity (BMI >30kg/m2);smoking/ tobacco exposure;family history of premature cardiovascular disease     Objective:     Vitals: There were no vitals taken for this visit.  There is no height or weight on file to calculate BMI.  Advanced Directives 08/30/2019 04/26/2019 09/28/2018 08/30/2018 12/02/2017  Does Patient Have a Medical Advance Directive? Yes Yes Yes Yes Yes  Type of Advance Directive Living will [place] Living will Living will [place] will  Does patient want to make changes to medical advance directive? No - Patient declined - - No - Patient declined -  Copy of [place] Chart? - Yes - validated most recent copy scanned in chart (See row information) - - No - copy requested    Tobacco Social History   Tobacco Use  Smoking Status Former Smoker  . Packs/day: 0.50  . Years: 15.00  . Pack years: 7.50  . Types: Cigarettes  . Quit date: 08/05/1983  . Years since quitting: 36.0  Smokeless Tobacco Never Used     Counseling given: Not Answered   Clinical Intake:  Pre-visit preparation completed: No  Pain Score: 3  Pain Type: Chronic pain Pain Location: Back(multiple sclerosis - hands and feet) Pain Orientation: Other (Comment)(both feet,hands and back) Pain Descriptors / Indicators: Aching, Sharp Pain Onset: (chronic pain 20 years) Pain Frequency: Constant Pain Relieving Factors: Clonazepam,Physical therapy,tylenol,Ibuprofen Effect of Pain on Daily Activities: yes,absolutely  Pain Relieving Factors: Clonazepam,Physical therapy,tylenol,Ibuprofen  BMI - recorded: 33.84 Nutritional Status: BMI > 30  Obese Nutritional Risks: None Diabetes: No  How often do you need to  have someone help you when you read instructions, pamphlets, or other written materials from your doctor or pharmacy?: 1 - Never What is the last grade level you completed in school?: Gradutate  Interpreter Needed?: No  Information entered by :: Alyson Ki FNP-C  Past Medical History:  Diagnosis Date  . Allergy   . Essential hypertension 08/25/2018  . Hot flashes, menopausal 10/13/2011   Estradiol started   . Multiple [place] ([place]   . Neuropathy   . Osteoarthritis   . Vision abnormalities    Past Surgical History:  Procedure Laterality Date  . ABDOMINAL HYSTERECTOMY  2002   partial  . LUMBAR FUSION     Family History  Problem Relation Age of Onset  . Heart failure Mother   . Heart failure Father   . Arthritis Other   . Hypertension Other    Social History   Socioeconomic History  . Marital status: Widowed    Spouse name: Not on file  . Number of children: Not on file  . Years of education: Not on file  . Highest education level: Not on file  Occupational History  . Occupation: retired  Tobacco Use  . Smoking status: Former Smoker    Packs/day: 0.50    Years: 15.00    Pack years: 7.50    Types: Cigarettes    Quit date: 08/05/1983    Years since quitting: 36.0  . Smokeless tobacco: Never Used  Substance and Sexual Activity  . Alcohol use: Yes    Alcohol/week: 0.0 standard drinks    Comment: rarely  . Drug use: No  . Sexual activity: Not Currently    Birth control/protection: Post-menopausal  Other  Topics Concern  . 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     Tobacco use, amount per day now: former   Past tobacco use, amount per day: 1/2-1 packet   How many years did you use tobacco: 12 years   Alcohol use (drinks per  week): 0   Diet: mediterranean based   Do you drink/eat things with caffeine: yes   Marital status:        widow                          What year were you married? 1987   Do you live in a house, apartment, assisted living, condo, trailer, etc.? house   Is it one or more stories? 1 story   How many persons live in your home? 1   Do you have pets in your home?( please list) no   Current or past profession: Science writer    Do you exercise?          yes                        Type and how often? Tai chi and pt therapy, stretches etc....   Do you have a living will? yes   Do you have a DNR form?      no                             If not, do you want to discuss one? yes   Do you have signed POA/HPOA forms?      no                  If so, please bring to you appointment   Social Determinants of Health   Financial Resource Strain:   . Difficulty of Paying Living Expenses: Not on file  Food Insecurity:   . Worried About Charity fundraiser in the Last Year: Not on file  . Ran Out of Food in the Last Year: Not on file  Transportation Needs:   . Lack of Transportation (Medical): Not on file  . Lack of Transportation (Non-Medical): Not on file  Physical Activity:   . Days of Exercise per Week: Not on file  . Minutes of Exercise per Session: Not on file  Stress:   . Feeling of Stress : Not on file  Social Connections:   . Frequency of Communication with Friends and Family: Not on file  . Frequency of Social Gatherings with Friends and Family: Not on file  . Attends Religious Services: Not on file  . Active Member of Clubs or Organizations: Not on file  . Attends Archivist Meetings: Not on file  . Marital Status: Not on file    Outpatient Encounter Medications as of 08/30/2019  Medication Sig  . aspirin EC 81 MG tablet Take 81 mg by mouth daily.  . calcium carbonate (OS-CAL) 600 MG TABS Take 600 mg by mouth daily.  . clonazePAM (KLONOPIN) 0.5 MG tablet Take 1  tablet by mouth at bedtime  . estradiol (CLIMARA -  DOSED IN MG/24 HR) 0.0375 mg/24hr patch Place 1 patch onto the skin once a week.   . fexofenadine (ALLEGRA) 180 MG tablet Take 180 mg by mouth daily.  [name] Kitchen leflunomide (ARAVA) 20 MG tablet TAKE ONE TABLET BY MOUTH DAILY  . losartan (COZAAR) 25 MG tablet TAKE ONE TABLET BY MOUTH DAILY  . Misc Natural Products (SUPER-D3+) 5000 units CAPS Take by mouth daily.  . modafinil (PROVIGIL) 200 MG tablet TAKE 1 TABLET EVERY MORNING AND ONE-HALF TABLET EVERY AFTERNOON  . omeprazole (PRILOSEC) 40 MG capsule TAKE ONE CAPSULE BY MOUTH DAILY  . Polyethylene Glycol 3350 (MIRALAX PO) Take by mouth daily as needed.   . progesterone (PROMETRIUM) 100 MG capsule TAKE 1 CAPSULE BY MOUTH EVERY DAY AT BEDTIME  . solifenacin (VESICARE) 5 MG tablet TAKE ONE TABLET BY MOUTH DAILY  . [DISCONTINUED] Blood Pressure Monitor KIT Check blood pressure twice daily   No facility-administered encounter medications on file as of 08/30/2019.    Activities of Daily Living In your present state of health, do you have any difficulty performing the following activities: 08/30/2019  Hearing? N  Vision? N  Difficulty concentrating or making decisions? Y  Comment Remembering  Walking or climbing stairs? Y  Dressing or bathing? N  Doing errands, shopping? N  Preparing Food and eating ? Y  Comment Tiredness from Multiple Sclerosis  Using the Toilet? N  In the past six months, have you accidently leaked urine? N  Do you have problems with loss of bowel control? N  Managing your Medications? N  Managing your Finances? N  Housekeeping or managing your Housekeeping? Y  Comment uses a Robot  Some recent data might be hidden    Patient Care Team: Gayland Curry, DO as PCP - General (Geriatric Medicine) [name], MD as Consulting Physician (Orthopedic Surgery) [name], [name], MD (Neurology) [name], [name], [place] (Optometry) [name], NP as Nurse Practitioner  (Obstetrics and Gynecology) [name], MD as Consulting Physician (Dermatology)    Assessment:   This is a routine wellness examination for [facility].  Exercise Activities and Dietary recommendations Current Exercise Habits: Home exercise routine, Type of exercise: stretching;strength training/weights(Tai chi video), Time (Minutes): 15, Frequency (Times/Week): 6, Weekly Exercise (Minutes/Week): 90, Intensity: Mild, Exercise limited by: Other - see comments(MS)  Goals    . Exercise 150 min/wk Moderate Activity     Stay active as much possible        Fall Risk Fall Risk  08/30/2019 12/06/2018 10/25/2018 09/28/2018 08/30/2018  Falls in the past year? 0 0 0 0 0  Number falls in past yr: 0 0 0 0 0  Injury with Fall? 0 0 0 0 0  Risk for fall due to : - - - - Impaired balance/gait;Impaired mobility;Medication side effect;Other (Comment)  Risk for fall due to: Comment - - - - MS  Follow up - - - - Falls evaluation completed;Education provided;Falls prevention discussed   Is the patient's home free of loose throw rugs in walkways, pet beds, electrical cords, etc?   no      Grab bars in the bathroom? yes      Handrails on the stairs? Back door stairs has a rail       Adequate lighting?   yes  Depression Screen PHQ 2/9 Scores 08/30/2019 12/06/2018 08/30/2018  PHQ - 2 Score 0 0 0     Cognitive Function     6CIT Screen 08/30/2019  What Year? 0 points  What month? 0  points  What time? 0 points  Count back from 20 0 points  Months in reverse 0 points  Repeat phrase 0 points  Total Score 0    Immunization History  Administered Date(s) Administered  . Fluad Quad(high Dose 65+) 04/01/2019  . Influenza Whole 05/14/2011  . Influenza, High Dose Seasonal PF 04/30/2017, 05/15/2018  . PFIZER SARS-COV-2 Vaccination 08/27/2019  . Pneumococcal Conjugate-13 08/30/2018  . Pneumococcal Polysaccharide-23 03/21/2009  . Td 12/04/2009  . Zoster 11/29/2009    Qualifies for Shingles Vaccine? Advised to  get vaccine at the pharmacy.  Screening Tests Health Maintenance  Topic Date Due  . Hepatitis C Screening  October 18, 1945  . DEXA SCAN  12/20/2012  . PNA vac Low Risk Adult (2 of 2 - PPSV23) 08/31/2019  . TETANUS/TDAP  12/05/2019  . MAMMOGRAM  03/02/2021  . Fecal DNA (Cologuard)  09/28/2021  . INFLUENZA VACCINE  Completed    Cancer Screenings: Lung: Low Dose CT Chest recommended if Age 28-80 years, 30 pack-year currently smoking OR have quit w/in 15years. Patient does not qualify. Breast:  Up to date on Mammogram? Yes   Up to date of Bone Density/Dexa? No would like to wait until she gets a final COVID-19 vaccine. Colorectal: up to date.   Additional Screenings: Hepatitis C Screening: Ordered this visit.    Plan:  - due for Tdap vaccine,PNA 23 vaccine and Dexa scan but would like to postpone until after completion of COVID-19 vaccine.   - Hep C screening ordered this visit to be drawn with next labs.   - Requires assistance with meals and Activity of daily living due to progressive Multiple sclerosis.Order placed for [facility] referral to assist with community resources.   I have personally reviewed and noted the following in the patient's chart:   . Medical and social history . Use of alcohol, tobacco or illicit drugs  . Current medications and supplements . Functional ability and status . Nutritional status . Physical activity . Advanced directives . List of other physicians . Hospitalizations, surgeries, and ER visits in previous 12 months . Vitals . Screenings to include cognitive, depression, and falls . Referrals and appointments  In addition, I have reviewed and discussed with patient certain preventive protocols, quality metrics, and best practice recommendations. A written personalized care plan for preventive services as well as general preventive health recommendations were provided to patient.    Sandrea Hughs, NP  08/30/2019

## 2019-08-30 NOTE — Progress Notes (Signed)
Patient ID: Dawn [name], female   DOB: July 13, 1946, 74 y.o.   MRN: [id number] This service is provided via telemedicine  No vital signs collected/recorded due to the encounter was a telemedicine visit.   Location of patient (ex: home, work):  HOME  Patient consents to a telephone visit:  YES  Location of the provider (ex: office, home):  OFFICE  Name of any referring provider:  TIFFANY REED, DO  Names of all persons participating in the telemedicine service and their role in the encounter:  PATIENT, Edwin Dada, [place], [place], NP  Time spent on call:  7:46

## 2019-09-01 ENCOUNTER — Ambulatory Visit: Payer: Medicare Other

## 2019-09-02 ENCOUNTER — Other Ambulatory Visit: Payer: Self-pay | Admitting: *Deleted

## 2019-09-02 ENCOUNTER — Other Ambulatory Visit: Payer: Self-pay | Admitting: Internal Medicine

## 2019-09-02 DIAGNOSIS — I10 Essential (primary) hypertension: Secondary | ICD-10-CM

## 2019-09-02 NOTE — Patient Outreach (Signed)
[facility]) Care Management  09/02/2019  Dawn [name] 01/19/1946 [id number]   Referral Date: 2/9 Referral Source: MD office Referral Reason: Multiple sclerosis progressive requires assistance with food and activity of daily living Insurance: Next Gen   Outreach attempt #1, successful.  Social: Lives alone, report she is independent in ADL's at this time but state that could change on a daily basis depending on her MS flare ups.  She is still able to drive but report she needed some information for future purposes as her disease continue to progress.  Does not feel she need the resources to use immediately but wanted to be proactive. Although she lives alone, she does report having friends available when needed.  Conditions: Per chart, has history of hypertension, MD, and hyperlipidemia.    Medications: Reviewed with member, denies questions regarding administration, denies need for financial assistance.  Appointments: Had Medicare Wellness visit on 2/9, visit with PCP on 3/29.  Also has visit with neurology on 3/3, report she is able to drive herself to appointments but interested in resources as disease progress.    Plan: RN CM will place referral to social worker for community resources.  Will not open to nursing at this time as she denies need.  She is open to receiving contact information just in case her status changes.  Will send successful outreach letter with this care manager's contact information.  Valente David, [place], MSN [place] [phone]

## 2019-09-07 ENCOUNTER — Other Ambulatory Visit: Payer: Self-pay | Admitting: *Deleted

## 2019-09-07 ENCOUNTER — Other Ambulatory Visit: Payer: Self-pay

## 2019-09-07 ENCOUNTER — Ambulatory Visit: Payer: Medicare Other | Admitting: Physical Therapy

## 2019-09-07 ENCOUNTER — Encounter: Payer: Self-pay | Admitting: *Deleted

## 2019-09-07 DIAGNOSIS — M545 Low back pain: Secondary | ICD-10-CM | POA: Diagnosis not present

## 2019-09-07 DIAGNOSIS — R262 Difficulty in walking, not elsewhere classified: Secondary | ICD-10-CM | POA: Diagnosis not present

## 2019-09-07 DIAGNOSIS — G8929 Other chronic pain: Secondary | ICD-10-CM

## 2019-09-07 DIAGNOSIS — R252 Cramp and spasm: Secondary | ICD-10-CM | POA: Diagnosis not present

## 2019-09-07 DIAGNOSIS — M6281 Muscle weakness (generalized): Secondary | ICD-10-CM | POA: Diagnosis not present

## 2019-09-07 NOTE — Patient Outreach (Signed)
[facility]) Care Management  09/07/2019  Dawn [name] 1945-10-24 [id number]   CSW made an initial attempt to try and contact patient today to perform the initial phone assessment, as well as assess and assist with social work needs and services, without success.  A HIPAA compliant message was left for patient on voicemail.  CSW is currently awaiting a return call.  CSW will make a second outreach attempt within the next 3-4 business days, on Tuesday, September 13, 2019, around 11:00am, if a return call is not received from patient in the meantime.  CSW will also mail an Outreach Letter to patient's home requesting that patient contact CSW if patient is interested in receiving social work services through [place] with Scientist, clinical (histocompatibility and immunogenetics).  Nat Christen, BSW, MSW, LCSW  Licensed Education officer, environmental Health System  Mailing [place] [address], [place], [place] [address] Physical Address-[address], [place], [place] [address] Toll Free Main # [phone] Fax # [phone] Cell # [phone]  Office # [phone] [email or web address]

## 2019-09-08 ENCOUNTER — Encounter: Payer: Self-pay | Admitting: Physical Therapy

## 2019-09-08 NOTE — Therapy (Signed)
[place], [place], [address] Phone: [phone]   Fax:  [phone]  Physical Therapy Treatment  Patient Details  Name: Dawn [name] MRN: [id number] Date of Birth: [birth year] Referring Provider (PT): Dr Christell Constant   Encounter Date: 09/07/2019  PT End of Session - 09/08/19 1339    Visit Number  16    Number of Visits  21    Date for PT Re-Evaluation  10/06/19    Authorization Type  Medicare Kx Modifier on visit 13 proress not on 10th visit    PT Start Time  1100    PT Stop Time  1143    PT Time Calculation (min)  43 min    Activity Tolerance  Patient tolerated treatment well    Behavior During Therapy  [facility] for tasks assessed/performed       Past Medical History:  Diagnosis Date  . Allergy   . Essential hypertension 08/25/2018  . Hot flashes, menopausal 10/13/2011   Estradiol started   . Multiple sclerosis ([place])   . Neuropathy   . Osteoarthritis   . Vision abnormalities     Past Surgical History:  Procedure Laterality Date  . ABDOMINAL HYSTERECTOMY  2002   partial  . LUMBAR FUSION      There were no vitals filed for this visit.  Subjective Assessment - 09/08/19 1337    Subjective  Patient reports her recent exacerbation of symptoms have decreased. She is back to her usual levels of pain with the great pain being in her left buttcok and right buttock. She has been working on her stretches and exercises which have helped.    Pertinent History  MS    How long can you stand comfortably?  hurts as soon as she stands. Has to shift frequently    How long can you walk comfortably?  limited community ambualtion    Diagnostic tests  Nothign recent    Currently in Pain?  Yes    Pain Score  3     Pain Location  Hip    Pain Orientation  Right    Pain Descriptors / Indicators  Aching    Pain Type  Chronic pain    Pain Onset  More than a month ago    Pain Frequency  Constant    Aggravating  Factors   standing and walking    Pain Relieving Factors  PT    Effect of Pain on Daily Activities  difficulty walking long distances                       [facility] Adult PT Treatment/Exercise - 09/08/19 0001      Lumbar Exercises: Stretches   Active Hamstring Stretch  3 reps;20 seconds      Lumbar Exercises: Standing   Heel Raises Limitations  2x10 with rocking back on her heels for psoterior balance training     Other Standing Lumbar Exercises  standing march with min cuing/ scap retraction cx20 red; shoulder extnesion x20 regular     Other Standing Lumbar Exercises  narrow base on air-ex 3x30 sec hold       Lumbar Exercises: Supine   Clam Limitations  supine red band 2x10       Manual Therapy   Manual Therapy  Passive ROM    Manual therapy comments  skilled palpation of trigger points     Joint Mobilization  PA glides from L2 to L5  Soft tissue mobilization  to lumbar spine and upper gluteals bilateral     Passive ROM  focus on left hip hip flexionIR/ER        Trigger Point Dry Needling - 09/08/19 0001    Consent Given?  Yes    Education Handout Provided  Previously provided    Dry Needling Comments  2 needles to glut medius on each side     Gluteus Medius Response  Twitch response elicited           PT Education - 09/08/19 1339    Education Details  reviewed the benefits of standing exercises    Person(s) Educated  Patient    Methods  Explanation;Demonstration;Verbal cues;Tactile cues    Comprehension  Verbalized understanding;Returned demonstration;Verbal cues required;Tactile cues required       PT Short Term Goals - 09/08/19 1342      PT SHORT TERM GOAL #1   Title  Patient will report no pain radiating into anterior thigh but still pain in the hip    Baseline  intemrittent but improved from last week    Time  4    Period  Weeks    Status  On-going    Target Date  05/24/19      PT SHORT TERM GOAL #2   Title  Patient will increase  bilateral LE strength to 4/5    Baseline  4/5 gross    Time  4    Period  Weeks    Status  On-going    Target Date  05/24/19      PT SHORT TERM GOAL #3   Title  Patient will increase passive right hip flexion to 105 degrrees    Baseline  103 today without pain    Time  4    Period  Weeks    Status  On-going    Target Date  05/24/19        PT Long Term Goals - 08/11/19 1201      PT LONG TERM GOAL #1   Title  Patient will stand for 30 min without self reported increase pain in order to stand at a kitchen sink.    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 She tolerated well. she had a good twitch respose to needling in bilateral gluteals today. Therapy will continue to progress strengthening towards goal of final HEP.  Personal Factors and Comorbidities  Comorbidity 1;Comorbidity 2    Comorbidities  MS, OA    Examination-Activity Limitations  Locomotion Level;Carry;Stand;Lift;Stairs;Squat    Examination-Participation Restrictions  Meal Prep;Cleaning;Community Activity;Laundry;Shop    Stability/Clinical Decision Making  Evolving/Moderate complexity    Clinical Decision Making  Moderate    Rehab Potential  Good    PT Frequency  2x / week    PT Duration  8 weeks    PT  Treatment/Interventions  ADLs/Self Care Home Management;Electrical Stimulation;Cryotherapy;Iontophoresis 4mg /ml Dexamethasone;Moist Heat;Traction;DME Instruction;Neuromuscular re-education;Patient/family education;Manual techniques;Passive range of motion;Taping    PT Next Visit Plan  continue to progress ther-ex as tolerated; review lifting technique    PT Home Exercise Plan  LTR; hamstring stretch, tennis ball trigger point release ; abdominal breathing    Consulted and Agree with Plan of Care  Patient       Patient will benefit from skilled therapeutic intervention in order to improve the following deficits and impairments:  Abnormal gait, Difficulty walking, Decreased range of motion, Decreased mobility, Decreased strength, Postural dysfunction, Pain, Increased muscle spasms  Visit Diagnosis: Chronic bilateral low back pain without sciatica  Muscle weakness (generalized)  Cramp and spasm  Difficulty in walking, not elsewhere classified     Problem List Patient Active Problem List   Diagnosis Date Noted  . Essential hypertension 08/25/2018  . Abnormal SPEP 02/19/2018  . Hand pain 02/20/2017  . Multiple joint pain 02/18/2017  . Right elbow pain 12/09/2016  . Disturbed cognition 06/25/2016  . Trochanteric bursitis of left hip 02/18/2016  . Sciatica, right side 10/30/2015  . Bilateral arm pain 10/10/2015  . Trochanteric bursitis of right hip 08/04/2014  . Adjustment disorder with mixed anxiety and depressed mood 08/04/2014  . Chronic fatigue 08/04/2014  . Urinary frequency 08/04/2014  . Abnormality of gait 08/04/2014  . Unspecified visual disturbance 01/31/2013  . Transient vision disturbance 01/31/2013  . Routine general medical examination at a health care facility 11/04/2011  . Colon polyps 11/03/2011  . Hot flashes, menopausal 10/13/2011  . POSTHERPETIC NEURALGIA 10/03/2009  . DISPLCMT LUMBAR INTERVERT [place] W/O MYELOPATHY 09/05/2009  . RENAL CALCULUS, RECURRENT  09/04/2009  . HYPERLIPIDEMIA 08/31/2009  . MULTIPLE SCLEROSIS, PROGRESSIVE/RELAPSING 08/31/2009  . ALLERGIC RHINITIS 08/31/2009    Carney Living PT DPT  09/08/2019, 1:48 PM  [place] [place], [place], [address] Phone: [phone]   Fax:  [phone]  Name: ROSALY MERKLEY MRN: [id number] Date of Birth: March 19, 1946

## 2019-09-13 ENCOUNTER — Other Ambulatory Visit: Payer: Self-pay | Admitting: *Deleted

## 2019-09-13 NOTE — Patient Outreach (Signed)
[facility]) Care Management  09/13/2019  Dawn [name] [birth year] [id number]   CSW made a second attempt to try and contact patient today to perform phone assessment, as well as assess and assist with social work needs and services, without success.  A HIPAA compliant message was left for patient on voicemail.  CSW continues to await a return call.  CSW will make a third and final outreach attempt within the next 3-4 business days, if a return call is not received from patient in the meantime.  CSW will then proceed with case closure if a return call is not received from patient with a total of 10 business days, as required number of phone attempts will have been made and outreach letter mailed.   Nat Christen, BSW, MSW, LCSW  Licensed Education officer, environmental Health System  Mailing [place] [address] [place], [place] [address] Physical Address-[address], [place], [place] [address] Toll Free Main # [phone] Fax # [phone] Cell # [phone]  Office # [phone] [email or web address]

## 2019-09-14 ENCOUNTER — Encounter: Payer: Self-pay | Admitting: Physical Therapy

## 2019-09-14 ENCOUNTER — Ambulatory Visit: Payer: Medicare Other | Admitting: Physical Therapy

## 2019-09-14 ENCOUNTER — Other Ambulatory Visit: Payer: Self-pay

## 2019-09-14 DIAGNOSIS — M6281 Muscle weakness (generalized): Secondary | ICD-10-CM | POA: Diagnosis not present

## 2019-09-14 DIAGNOSIS — R262 Difficulty in walking, not elsewhere classified: Secondary | ICD-10-CM

## 2019-09-14 DIAGNOSIS — R252 Cramp and spasm: Secondary | ICD-10-CM | POA: Diagnosis not present

## 2019-09-14 DIAGNOSIS — G8929 Other chronic pain: Secondary | ICD-10-CM | POA: Diagnosis not present

## 2019-09-14 DIAGNOSIS — M545 Low back pain: Secondary | ICD-10-CM | POA: Diagnosis not present

## 2019-09-14 NOTE — Therapy (Signed)
[place], [place], [address] Phone: [phone]   Fax:  [phone]  Physical Therapy Treatment  Patient Details  Name: Dawn [name] MRN: [id number] Date of Birth: March 15, 1946 Referring Provider (PT): Dr Christell Constant   Encounter Date: 09/14/2019  PT End of Session - 09/14/19 1103    Visit Number  17    Number of Visits  21    Date for PT Re-Evaluation  10/06/19    Authorization Type  Medicare Kx Modifier on visit 13 proress not on 10th visit    PT Start Time  1100    PT Stop Time  1143    PT Time Calculation (min)  43 min    Activity Tolerance  Patient tolerated treatment well    Behavior During Therapy  [facility] for tasks assessed/performed       Past Medical History:  Diagnosis Date  . Allergy   . Essential hypertension 08/25/2018  . Hot flashes, menopausal 10/13/2011   Estradiol started   . Multiple sclerosis ([place])   . Neuropathy   . Osteoarthritis   . Vision abnormalities     Past Surgical History:  Procedure Laterality Date  . ABDOMINAL HYSTERECTOMY  2002   partial  . LUMBAR FUSION      There were no vitals filed for this visit.  Subjective Assessment - 09/14/19 1106    Subjective  Patient reports no real change. Her right hip continues to hurt. She has been working on her stretches and exercises. She feels like the weather makes a difference.    Pertinent History  MS    How long can you stand comfortably?  hurts as soon as she stands. Has to shift frequently    How long can you walk comfortably?  limited community ambualtion    Diagnostic tests  Nothign recent    Currently in Pain?  Yes                       [facility] Adult PT Treatment/Exercise - 09/14/19 0001      Lumbar Exercises: Stretches   Lower Trunk Rotation Limitations  x10 with cuing for technique     Piriformis Stretch Limitations  2x20 sec holdleft only today      Lumbar Exercises: Seated   LAQ on Chair  Limitations  2x10 green band     Other Seated Lumbar Exercises  hamstring curl 2x10 red band; clamshell x20 red band       Manual Therapy   Manual Therapy  Passive ROM    Manual therapy comments  skilled palpation of trigger points     Joint Mobilization  PA glides from L2 to L5     Soft tissue mobilization  to lumbar spine and upper gluteals bilateral     Passive ROM  focus on left hip hip flexionIR/ER        Trigger Point Dry Needling - 09/14/19 0001    Consent Given?  Yes    Education Handout Provided  Previously provided    Dry Needling Comments  2 needles to glut medius on each side     Gluteus Medius Response  Twitch response elicited             PT Short Term Goals - 09/08/19 1342      PT SHORT TERM GOAL #1   Title  Patient will report no pain radiating into anterior thigh but still pain in the hip  Baseline  intemrittent but improved from last week    Time  4    Period  Weeks    Status  On-going    Target Date  05/24/19      PT SHORT TERM GOAL #2   Title  Patient will increase bilateral LE strength to 4/5    Baseline  4/5 gross    Time  4    Period  Weeks    Status  On-going    Target Date  05/24/19      PT SHORT TERM GOAL #3   Title  Patient will increase passive right hip flexion to 105 degrrees    Baseline  103 today without pain    Time  4    Period  Weeks    Status  On-going    Target Date  05/24/19        PT Long Term Goals - 08/11/19 1201      PT LONG TERM GOAL #1   Title  Patient will stand for 30 min without self reported increase pain in order to stand at a kitchen sink.    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 Depsite baseline pain she was able to complete all activity. Per visual inspection the patients balance ahas improved. She was able to maintain tandem stance with just SBA today without lob. She has been working on it at home.    Personal Factors and Comorbidities  Comorbidity 1;Comorbidity 2    Comorbidities  MS, OA    Examination-Activity Limitations  Locomotion Level;Carry;Stand;Lift;Stairs;Squat    Examination-Participation Restrictions  Meal Prep;Cleaning;Community Activity;Laundry;Shop    Stability/Clinical Decision Making  Evolving/Moderate complexity    Clinical Decision Making  Moderate    Rehab Potential  Good    PT Frequency  2x / week    PT Duration  8 weeks    PT Treatment/Interventions  ADLs/Self Care Home Management;Electrical Stimulation;Cryotherapy;Iontophoresis 4mg /ml Dexamethasone;Moist Heat;Traction;DME Instruction;Neuromuscular re-education;Patient/family education;Manual techniques;Passive range of motion;Taping    PT Next Visit Plan  continue to progress ther-ex as tolerated; review lifting technique    PT Home Exercise Plan  LTR; hamstring stretch, tennis ball trigger point release ; abdominal breathing    Consulted and Agree with Plan of Care  Patient       Patient will benefit from skilled therapeutic intervention in order to improve the following deficits and impairments:  Abnormal gait, Difficulty  walking, Decreased range of motion, Decreased mobility, Decreased strength, Postural dysfunction, Pain, Increased muscle spasms  Visit Diagnosis: Chronic bilateral low back pain without sciatica  Muscle weakness  (generalized)  Cramp and spasm  Difficulty in walking, not elsewhere classified     Problem List Patient Active Problem List   Diagnosis Date Noted  . Essential hypertension 08/25/2018  . Abnormal SPEP 02/19/2018  . Hand pain 02/20/2017  . Multiple joint pain 02/18/2017  . Right elbow pain 12/09/2016  . Disturbed cognition 06/25/2016  . Trochanteric bursitis of left hip 02/18/2016  . Sciatica, right side 10/30/2015  . Bilateral arm pain 10/10/2015  . Trochanteric bursitis of right hip 08/04/2014  . Adjustment disorder with mixed anxiety and depressed mood 08/04/2014  . Chronic fatigue 08/04/2014  . Urinary frequency 08/04/2014  . Abnormality of gait 08/04/2014  . Unspecified visual disturbance 01/31/2013  . Transient vision disturbance 01/31/2013  . Routine general medical examination at a health care facility 11/04/2011  . Colon polyps 11/03/2011  . Hot flashes, menopausal 10/13/2011  . POSTHERPETIC NEURALGIA 10/03/2009  . DISPLCMT LUMBAR INTERVERT [place] W/O MYELOPATHY 09/05/2009  . RENAL CALCULUS, RECURRENT 09/04/2009  . HYPERLIPIDEMIA 08/31/2009  . MULTIPLE SCLEROSIS, PROGRESSIVE/RELAPSING 08/31/2009  . ALLERGIC RHINITIS 08/31/2009    Carney Living PT DPT  09/14/2019, 11:54 AM  [facility] [address] [place], [place], [address] Phone: [phone]   Fax:  [phone]  Name: Dawn [name] MRN: [id number] Date of Birth: 1946-04-17

## 2019-09-16 ENCOUNTER — Other Ambulatory Visit: Payer: Self-pay | Admitting: *Deleted

## 2019-09-16 NOTE — Patient Outreach (Signed)
[facility]) Care Management  09/16/2019  ZAMARIYAH SCARPINATO 09-Jul-1946 [id number]    CSW made a third and final attempt to try and contact patient today to perform phone assessment, as well as assess and assist with social work needs and services, without success.  A HIPAA compliant message was left for patient on voicemail.  CSW is currently awaiting a return call.  CSW will proceed with case closure in the next three business days, on Wednesday, September 21, 2019, around 1:30pm, if a return call is not received from patient in the meantime, as required number of phone attempts have been made and an outreach letter was mailed to patient's home allowing 10 business days for a response if patient was interested in receiving social work services through [place] with Scientist, clinical (histocompatibility and immunogenetics).  Nat Christen, BSW, MSW, LCSW  Licensed Education officer, [facility]  Mailing [place] [address], [place], [place] [address] Physical Address-[address], [place],  [address] Toll Free Main # [phone] Fax # [phone] Cell # [phone]  Office # [phone] [email or web address]

## 2019-09-21 ENCOUNTER — Ambulatory Visit: Payer: Medicare Other | Admitting: Physical Therapy

## 2019-09-21 ENCOUNTER — Encounter: Payer: Medicare Other | Admitting: Physical Therapy

## 2019-09-21 ENCOUNTER — Ambulatory Visit: Payer: Medicare Other | Attending: Internal Medicine

## 2019-09-21 ENCOUNTER — Encounter: Payer: Self-pay | Admitting: *Deleted

## 2019-09-21 ENCOUNTER — Other Ambulatory Visit: Payer: Self-pay | Admitting: *Deleted

## 2019-09-21 ENCOUNTER — Ambulatory Visit (INDEPENDENT_AMBULATORY_CARE_PROVIDER_SITE_OTHER): Payer: Medicare Other | Admitting: Neurology

## 2019-09-21 ENCOUNTER — Encounter: Payer: Self-pay | Admitting: Neurology

## 2019-09-21 ENCOUNTER — Other Ambulatory Visit: Payer: Self-pay

## 2019-09-21 VITALS — BP 176/85 | HR 75 | Temp 97.9°F | Ht 62.0 in | Wt 192.5 lb

## 2019-09-21 DIAGNOSIS — G35 Multiple sclerosis: Secondary | ICD-10-CM | POA: Diagnosis not present

## 2019-09-21 DIAGNOSIS — G5603 Carpal tunnel syndrome, bilateral upper limbs: Secondary | ICD-10-CM | POA: Diagnosis not present

## 2019-09-21 DIAGNOSIS — Z79899 Other long term (current) drug therapy: Secondary | ICD-10-CM | POA: Insufficient documentation

## 2019-09-21 DIAGNOSIS — Z23 Encounter for immunization: Secondary | ICD-10-CM | POA: Insufficient documentation

## 2019-09-21 DIAGNOSIS — R200 Anesthesia of skin: Secondary | ICD-10-CM | POA: Insufficient documentation

## 2019-09-21 DIAGNOSIS — R413 Other amnesia: Secondary | ICD-10-CM

## 2019-09-21 HISTORY — DX: Other amnesia: R41.3

## 2019-09-21 MED ORDER — CLONAZEPAM 0.5 MG PO TABS: ORAL_TABLET | ORAL | 1 refills | Status: DC

## 2019-09-21 MED ORDER — MODAFINIL 200 MG PO TABS: ORAL_TABLET | ORAL | 1 refills | Status: DC

## 2019-09-21 MED ORDER — TRAMADOL HCL 50 MG PO TABS: 50.0000 mg | ORAL_TABLET | Freq: Every day | ORAL | 5 refills | Status: DC

## 2019-09-21 NOTE — Progress Notes (Signed)
   Covid-19 Vaccination Clinic  Name:  Dawn [name]    MRN: [id number] DOB: 02-14-1946  09/21/2019  [name] was observed post Covid-19 immunization for 15 minutes without incident. She was provided with Vaccine Information Sheet and instruction to access the V-Safe system.   [name] was instructed to call 911 with any severe reactions post vaccine: [name] Kitchen Difficulty breathing  . Swelling of face and throat  . A fast heartbeat  . A bad rash all over body  . Dizziness and weakness   Immunizations Administered    Name Date Dose VIS Date Route   Pfizer COVID-19 Vaccine 09/21/2019  2:03 PM 0.3 mL 07/01/2019 Intramuscular   Manufacturer: [place]   Lot: [id number]   [place]: [id number]

## 2019-09-21 NOTE — Progress Notes (Signed)
[facility]  PATIENT: Dawn [name] DOB: 05-01-1946  REFERRING CLINICIAN: Aura Dials HISTORY FROM: Patient REASON FOR VISIT: MS   HISTORICAL  CHIEF COMPLAINT:  Chief Complaint  Patient presents with  . Follow-up    RM 12, alone. Last seen 03/23/2019. Having increased pain in both hands, worse with colder weather. No falls since last seen. However balance is worse, doing PT once a week. She has two more weeks left. Feels memory is getting worse.   . Multiple Sclerosis    On leflunomide. Takes modafinil for fatigue.    HISTORY OF PRESENT ILLNESS:  Dawn [name] is a 74 y.o. woman who was diagnosed with multiple sclerosis in 2001.      Update 09/21/2019: Her MS is stable without exacerbations.    She is on Leflunomide and tolerates it well.    Insurance had not covered Aubagio well for her in the past.  She is noting more pain in both hands with dysesthesia.   She drops things at times.      The pain wakes her up at night some nights.   Her leg pain is also waking her up at night.   Pain is worse at night but still bothers her during the day.   In the past, gabapentin had been tried but she does not recall if there was any benefit.   Tramadol had helped her pain in the past.       Clonazepam helps the insomnia and spasticity at night.   She sleeps 3 solid hours of sleep nightly then intermittently more sleep.  She feels her cognition is worse.    She is noting more anxiety.     She has had more memory difficulties and is forgetful.    Both her and friends have noticed.  Her last ESI did not help the LBP/leg pain.   PT with needling has helped.  Update 03/23/2019: She feels her MS is stable and has no exacerbations.    She is on leflunomide and tolerated well.  She has more more pain in the hips and back.  She hasn't done PT or Tai Chi since Covid.   She hasn't got out of her house much since the pandemic.    She feels more down recently and is more  fatigued.    She does not any close family in [place].   We discussed trying to get out of the house.     She continues to have some back pain.  Pain is worse on the right.   Pain increases when she walks and that limits her ability to walk more than a couple 100 yards  She is getting some ocular migraines.     She will note some scintillations in her visual field for 10 to 15 minutes.  These are not followed by headache.   Update 08/25/2018: She reports that her MS is a little worse and she notes some new symptoms.  She has not noted any exacerbation.  She is on leflunomide and tolerates it well.   She feels gait is mildly worse.   She is doing PT and does dry needling and stretches.  Pain was worse 3 weeks ago.   A prednisone pack helped.  She is worried about attention and focus are worse. Memory seems worse.    She has mor trouble with coming up with the right words.    She feels her BP is high and sometimes the heart is racing.  She felt these symptoms started with starting Aubagio and continued with switch to Leflunomide.    BP has been elevated at times and is 175/90 today.      MGUS was considered based on a probable IgG lambda but this was not felt to be relevant.   Follow-up labwork showed a normal IEF   Update 02/19/2018: Her MS is doing well.   She is on Leflunomide and she tolerates it well.  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    She feels she has slowly progressed over the last couple years with gait.    Her memory is also worse.    She tries to keep herself mentally active.    She notes reduced STM and reduced focus/attentipon.   She left the stove on once.    Usually, when she forgets, she will remember later or remember with a hint.       She is doing PT for her lower back pain and feels it is doing well.    She is doing dry needling with benefit.    She is on etodolac but is no longer on gabapentin and tramadol.     The bursitis pain has returned.    She gets ome  benefit from the trochanteric bursa injections she had in the past.      She continues to have some generalized joint pain though the hips are much more painful than elsewhere.  Due to her back and hip pain she saw orthopedics.  An MRI of the lumbar spine was performed showing a focus in the L1 vertebral body.  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   She was referred to hematology but the appointment was made in [place] and she was unable to go there and she has not followed up.       Update 10/20/2017: She is reporting more pain in the hips, left > right.  Pain is worse when she is laying on her sides or if she stands or walks a while.  She received benefit from trochanteric bursae injections in the past.   We discussed her seeing Ortho.     The right elbow pain is much batter after a shot in the past.    Her MS is doing well.  She is on leflunomide (she had issues getting patient assistance for Aubagio).  She is tolerating it well.  She has not had any exacerbations since starting.  She feels her MS is stable.  She notes no new issues with her gait, strength or sensation.  Balance is still off at times.  She has some urinary frequency and urgency but no incontinence.   She stopped Myrbetriq and went back to [place] because she had a mild rash.    Update 08/12/2017: She saw Rheumatology [facility] [place]) and they feel that she does not have RA or Sjogren's.    She switched form Aubagio to Leflunomide due to insurance issues and very high copay.    She notes that she feels better overall and copay is only $8.   However, she notes that her mouth is dryer and throat is scratchy much of the time.   She also notes mild dry eyes.   She has not seen ophthalmology in several years.      She denies new issues with gait, strength or sensation.    No change in bladder with frequency and nocturia (twice  mostly).    She notes less worry and stress since Aubagio was  switched to Leflunomide as she was going to have very high copays.     She feels better with less dysesthesia since switching to leflunomide   She has bilateral hip pain again.  Pain worsens with lying on her side or standing a while. In the past, trochanteric bursae injections were beneficial  Update 04/14/2017: She is on Aubagio but is just about out.   [facility] [employer] is not able to help her get Aubagio without a very high copay.  She is needing to call the "charity" daily to see if new funds are available.   We discussed trying Leflunomide which is a precursor of teriflunomide and used for rheumatoid arthritis.    I have used this for other patients with the same problems getting medication.     For the most part, her MS is stable but she is noting more burning dysesthesia in her feet.   Aspercreme helps Tramadol was not well tolerated.   Etodolac helps the arthritic pain some.   Her elbow pain improved with an injection.    A piriformis injection only helped a week.   She has myalgias throughout her body.     She has had dark urine a few times but it only lasted one day.    Bladder function is unchanged and Vesicare helps the frequency and urgency.  .   Vision sometimes seems blurry but this fluctuates.     Fatigue bothers her daily.   Provigil helps some but incompletely.  Mood has been worse since she found out she wasn't going to be getting copay assistance for Aubagio ________________________________________________ From 02/18/2017:  Pain:   She is experiencing bilateral hip pain and more pain in her hands and feet.     We have done trochanteric bursae injections and bilateral piriformis injections seemed to help the pain for a longer time and pelvic pain is also better.  She has  A labrum tear on the right and has seen ortho.     She also has elbow pain on the right, helped x 2 months by steroid injection at the last visit.   Tramadol and etodolac helps some but not long term.   All the joint  pain worsens with use and improves with a heating pad.   She has not been tested for inflammatory arthritis.   Other joints also hurt much of the time.  MS:   She is on Aubagio and she tolerates it well. She has occasional diarrhea.   She could not tolerate Tecfidera and had tolerability issues with Plegridy and Betaseron earlier.   She is not having any definite exacerbation though she has some sensory fluctuations.   The MRI performed January 2018 showed chronic MS lesions but no acute findings.   She also appears to have had a left cerebellar CVA (also chronic)  Vision:   She notes mild blurred vision that is intermittent.    She feels both eyes are involved.   Once occurred while driving and once while watching TV.    She did not feel symptoms were just from one eye.     Gait/strength/sensation:     She uses a cane for longer distances and if tired but walks without most of the time.  She uses it more when hips act up also.       She notes more muscle fatigue.    She has dysesthesias  mostly in the left leg that did not respond well to the medications.     She denies much weakness but has muscle stiffness and pain.   Bladder:   She has urinary frequency and hesitancy.  She is on tamsulosin qod.  Vesicare helped but seemed to help her more last year that this year.    She has seen [facility].  She has a little diarrhea at times.    Fatigue/sleep:   She has fatigue that is worse in the afternoons.    She is back on modafinil with benefit. She has some difficulty with sleep onset insomnia > sleep maintenance, mostly due to pain she thinks.   Vesicare has helped sleep with less nocturia.    She has not been told she snores.    Mood/cognition:    Mood is generslly gfood, occ down with her pain.   She gets frustrated easily.  She is now doing an exercise class out in the community (does sitting down --- it a an arthritis group)..   Cognitively, she has difficulty with short-term memory as well as with  verbal fluency  MS History:    In 1994, she had an episode of severe vertigo with gait ataxia. An MRI at that time was reportedly normal. The symptoms were felt to be due to allergies. About 7 years later she had vertigo, gait ataxia and diplopia. Also her left leg was giving out. Short after that she had a hysterectomy. Postoperatively, she was numb in both legs and she had a lumbar puncture which showed changes consistent with MS. Then, an MRI of the brain was performed showing changes consistent with MS. She was referred to Dr. Erling Cruz who her on Betaseron. Last year, she switched from Betaseron to Tecfidera, in the hope that she would be able to avoid shots as she was having severe skin reactions with knots.    However, she had a lot of difficulty tolerating Tecfidera and swithced to [place] in early 2016 and to Aubagio late 2016    REVIEW OF SYSTEMS:  Constitutional: No fevers, chills, sweats, or change in appetite.   Reports fatigue Eyes: No visual changes, double vision, eye pain Ear, nose and throat: No hearing loss, ear pain, nasal congestion, sore throat Cardiovascular: No chest pain, palpitations Respiratory:  No shortness of breath at rest or with exertion.   No wheezes GastrointestinaI: No nausea, vomiting, diarrhea, abdominal pain, fecal incontinence Genitourinary:  see above. Musculoskeletal:  Pain in left greater than right hip Integumentary: No rash, pruritus.    Neurological: as above Psychiatric: No depression at this time.  No anxiety Endocrine: No palpitations, diaphoresis, change in appetite, change in weigh or increased thirst Hematologic/Lymphatic:  No anemia, purpura, petechiae. Allergic/Immunologic: No itchy/runny eyes, nasal congestion, recent allergic reactions, rashes  ALLERGIES: No Known Allergies  HOME MEDICATIONS: Outpatient Medications Prior to Visit  Medication Sig Dispense Refill  . aspirin EC 81 MG tablet Take 81 mg by mouth daily.    . calcium carbonate  (OS-CAL) 600 MG TABS Take 600 mg by mouth daily.    [name] Kitchen estradiol (CLIMARA - DOSED IN MG/24 HR) 0.025 mg/24hr patch Place 0.025 mg onto the skin once a week.    . fexofenadine (ALLEGRA) 180 MG tablet Take 180 mg by mouth daily.    [name] Kitchen leflunomide (ARAVA) 20 MG tablet TAKE ONE TABLET BY MOUTH DAILY 90 tablet 4  . losartan (COZAAR) 25 MG tablet TAKE 1 TABLET BY MOUTH EVERY DAY 90 tablet 1  .  Misc Natural Products (SUPER-D3+) 5000 units CAPS Take by mouth daily.    [name] Kitchen omeprazole (PRILOSEC) 40 MG capsule TAKE ONE CAPSULE BY MOUTH DAILY 90 capsule 1  . Polyethylene Glycol 3350 (MIRALAX PO) Take by mouth daily as needed.     . progesterone (PROMETRIUM) 100 MG capsule TAKE 1 CAPSULE BY MOUTH EVERY DAY AT BEDTIME  3  . solifenacin (VESICARE) 5 MG tablet TAKE ONE TABLET BY MOUTH DAILY 90 tablet 1  . clonazePAM (KLONOPIN) 0.5 MG tablet Take 1 tablet by mouth at bedtime 90 tablet 1  . modafinil (PROVIGIL) 200 MG tablet TAKE 1 TABLET EVERY MORNING AND ONE-HALF TABLET EVERY AFTERNOON 135 tablet 1  . estradiol (CLIMARA - DOSED IN MG/24 HR) 0.0375 mg/24hr patch Place 1 patch onto the skin once a week.   11   No facility-administered medications prior to visit.    PAST MEDICAL HISTORY: Past Medical History:  Diagnosis Date  . Allergy   . Essential hypertension 08/25/2018  . Hot flashes, menopausal 10/13/2011   Estradiol started   . Memory loss 09/21/2019  . Multiple sclerosis ([place])   . Neuropathy   . Osteoarthritis   . Vision abnormalities     PAST SURGICAL HISTORY: Past Surgical History:  Procedure Laterality Date  . ABDOMINAL HYSTERECTOMY  2002   partial  . LUMBAR FUSION      FAMILY HISTORY: Family History  Problem Relation Age of Onset  . Heart failure Mother   . Heart failure Father   . Arthritis Other   . Hypertension Other     SOCIAL HISTORY:  Social History   Socioeconomic History  . Marital status: Widowed    Spouse name: Not on file  . Number of children: Not on file  . Years  of education: Not on file  . Highest education level: Not on file  Occupational History  . Occupation: retired  Tobacco Use  . Smoking status: Former Smoker    Packs/day: 0.50    Years: 15.00    Pack years: 7.50    Types: Cigarettes    Quit date: 08/05/1983    Years since quitting: 36.1  . Smokeless tobacco: Never Used  Substance and Sexual Activity  . Alcohol use: Yes    Alcohol/week: 0.0 standard drinks    Comment: rarely  . Drug use: No  . Sexual activity: Not Currently    Birth control/protection: Post-menopausal  Other Topics Concern  . 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     Tobacco use, amount per day now: former   Past tobacco use, amount per day: 1/2-1 packet   How many years did you use tobacco: 12 years   Alcohol use (drinks per week): 0   Diet: mediterranean based   Do you drink/eat things with caffeine: yes   Marital status:        widow                          What year were you married? 1987   Do you live in a house, apartment, assisted living, condo, trailer, etc.? house   Is it one or more stories? 1 story   How  many persons live in your home? 1   Do you have pets in your home?( please list) no   Current or past profession: Science writer    Do you exercise?          yes                        Type and how often? Tai chi and pt therapy, stretches etc....   Do you have a living will? yes   Do you have a DNR form?      no                             If not, do you want to discuss one? yes   Do you have signed POA/HPOA forms?      no                  If so, please bring to you appointment   Social Determinants of Health   Financial Resource Strain:   . Difficulty of Paying Living Expenses: Not on file   Food Insecurity:   . Worried About Charity fundraiser in the Last Year: Not on file  . Ran Out of Food in the Last Year: Not on file  Transportation Needs:   . Lack of Transportation (Medical): Not on file  . Lack of Transportation (Non-Medical): Not on file  Physical Activity:   . Days of Exercise per Week: Not on file  . Minutes of Exercise per Session: Not on file  Stress:   . Feeling of Stress : Not on file  Social Connections:   . Frequency of Communication with Friends and Family: Not on file  . Frequency of Social Gatherings with Friends and Family: Not on file  . Attends Religious Services: Not on file  . Active Member of Clubs or Organizations: Not on file  . Attends Archivist Meetings: Not on file  . Marital Status: Not on file  Intimate Partner Violence:   . Fear of Current or Ex-Partner: Not on file  . Emotionally Abused: Not on file  . Physically Abused: Not on file  . Sexually Abused: Not on file     PHYSICAL EXAM  Vitals:   09/21/19 0943  BP: (!) 176/85  Pulse: 75  Temp: 97.9 F (36.6 C)  Weight: 192 lb 8 oz (87.3 kg)  Height: _0  (1.575 m)    Body mass index is 35.21 kg/m.   General: The patient is well-developed and well-nourished and in no acute distress  Musculoskeletal: She has mild trochanteric bursa tenderness, bilaterally.  Mild lower lumbar paraspinal tenderness.  Neurologic Exam  Mental status: The patient is alert and oriented x 3 at the time of the examination. The patient has apparent mildly reduced recent (2/3 spontaneous and 3/3 with prompt) and remote memory, with an apparently normal attention span and concentration ability.   Speech is normal.    Clock drawn well.   One mistake continuing geometric pattern.    Cranial nerves: Extraocular movements are full.  Facial strength and sensory are normal.  Hearing appeared to be symmetric  Motor:  Muscle bulk is normal but tone is symmetric, more on the left.. Strength is 4/5  in median innervated and 4+/5 and ulnar innervated intrinsic hand muscles   Sensory: She has reduced sensation over the thenar eminences compared to the hyperthenar eminences of  the hands.  Sensory testing is intact to touch and vibration in the arms.  She has reduced vibration sensation in the feet.  Coordination: Finger-nose-finger and heel-to-shin is performed better on the right than the left.  Gait and station: Station is normal.  Gait is wide and tandem gait is poor.  Romberg is negative.  Reflexes: Deep tendon reflexes are symmetric and normal bilaterally.      DIAGNOSTIC DATA (LABS, IMAGING, TESTING) - I reviewed patient records, labs, notes, testing and imaging myself where available.  Lab Results  Component Value Date   WBC 5.2 03/23/2019   HGB 13.5 03/23/2019   HCT 41.1 03/23/2019   MCV 90 03/23/2019   PLT 221 03/23/2019      Component Value Date/Time   NA 139 03/23/2019 1053   K 4.6 03/23/2019 1053   CL 103 03/23/2019 1053   CO2 22 03/23/2019 1053   GLUCOSE 77 03/23/2019 1053   GLUCOSE 88 03/09/2018 1342   BUN 14 03/23/2019 1053   CREATININE 0.67 03/23/2019 1053   CREATININE 0.75 03/09/2018 1342   CALCIUM 9.8 03/23/2019 1053   PROT 6.0 03/23/2019 1053   ALBUMIN 4.2 03/23/2019 1053   AST 19 03/23/2019 1053   AST 20 03/09/2018 1342   ALT 18 03/23/2019 1053   ALT 22 03/09/2018 1342   ALKPHOS 134 (H) 03/23/2019 1053   BILITOT 0.3 03/23/2019 1053   BILITOT 0.4 03/09/2018 1342   GFRNONAA 89 03/23/2019 1053   GFRNONAA >60 03/09/2018 1342   GFRAA 102 03/23/2019 1053   GFRAA >60 03/09/2018 1342   Lab Results  Component Value Date   CHOL 203 (H) 11/03/2011   HDL 44.60 11/03/2011   LDLCALC 136 (H) 12/05/2009   LDLDIRECT 130.2 11/03/2011   TRIG 185.0 (H) 11/03/2011   CHOLHDL 5 11/03/2011   No results found for: HGBA1C No results found for: VITAMINB12 Lab Results  Component Value Date   TSH 3.720 08/25/2018       ASSESSMENT AND PLAN  Multiple  sclerosis ([place]) - Plan: Comprehensive metabolic panel, TSH, CBC with Differential/Platelet  Bilateral carpal tunnel syndrome - Plan: NCV with EMG(electromyography)  Numbness - Plan: NCV with EMG(electromyography)  High risk medication use - Plan: Comprehensive metabolic panel, TSH, CBC with Differential/Platelet  Memory loss   1.    She will continue leflunomide for MS.   Check labs.   2.    If gait issues worsen consider additional physical therapy.   3.    Numbness could be multifactorial from the MS and neuropathic issues.  She appears to have bilateral carpal tunnel syndromes.  We will check an NCV/EMG to determine if she needs referral for surgery.  We discussed getting wrist brace to wear at night. 4.    Tramadol at night to help with the pain.   5.    Return to see me in about 6 months but is advised to call sooner if she has new or worsening neurologic symptoms.  40 minutes face-to-face interaction including face-to-face time (30 minutes), record review, imaging review and documentation.  Richard A. Felecia Shelling, MD, PhD 07/26/1094, 04:54 PM Certified in Neurology, Clinical Neurophysiology, Sleep Medicine, Pain Medicine and Neuroimaging  [facility] [address] [place], [place] [phone]-' \

## 2019-09-21 NOTE — Patient Outreach (Signed)
[facility]) Care Management  09/21/2019  Dawn [name] 07-Mar-1946 [id number]    CSW will perform a case closure on patient, due to inability to establish initial phone contact with patient, despite required number of phone attempts made and outreach letter mailed to patient's home, allowing 10 business days for a response if patient was interested in receiving social work services and resources through [place] with Scientist, clinical (histocompatibility and immunogenetics).  CSW will notify patient's RNCM, and individual placing the initial referral, also with [facility], Valente David of CSW's plans to close patient's case.  CSW will fax an update to patient's Primary Care Physician, Dr. Hollace Kinnier to ensure that she is aware of CSW's attempts at involvement with patient's plan of care.   Nat Christen, BSW, MSW, LCSW  Licensed Education officer, [facility]  Mailing [place] [address] [place], [place] [address] Physical Address-[address], [place], [place] [address] Toll Free Main # [phone] Fax # [phone] Cell # [phone]  Office # [phone] [email or web address]

## 2019-09-22 LAB — COMPREHENSIVE METABOLIC PANEL
ALT: 24 IU/L (ref 0–32)
AST: 24 IU/L (ref 0–40)
Albumin/Globulin Ratio: 2.3 — ABNORMAL HIGH (ref 1.2–2.2)
Albumin: 4.3 g/dL (ref 3.7–4.7)
Alkaline Phosphatase: 147 IU/L — ABNORMAL HIGH (ref 39–117)
BUN/Creatinine Ratio: 19 (ref 12–28)
BUN: 15 mg/dL (ref 8–27)
Bilirubin Total: 0.3 mg/dL (ref 0.0–1.2)
CO2: 23 mmol/L (ref 20–29)
Calcium: 9.4 mg/dL (ref 8.7–10.3)
Chloride: 104 mmol/L (ref 96–106)
Creatinine, Ser: 0.78 mg/dL (ref 0.57–1.00)
GFR calc Af Amer: 88 mL/min/{1.73_m2} (ref >59)
GFR calc non Af Amer: 77 mL/min/{1.73_m2} (ref >59)
Globulin, Total: 1.9 g/dL (ref 1.5–4.5)
Glucose: 84 mg/dL (ref 65–99)
Potassium: 4.4 mmol/L (ref 3.5–5.2)
Sodium: 140 mmol/L (ref 134–144)
Total Protein: 6.2 g/dL (ref 6.0–8.5)

## 2019-09-22 LAB — CBC WITH DIFFERENTIAL/PLATELET
Basophils Absolute: 0.1 10*3/uL (ref 0.0–0.2)
Basos: 1 %
EOS (ABSOLUTE): 0.1 10*3/uL (ref 0.0–0.4)
Eos: 2 %
Hematocrit: 39.8 % (ref 34.0–46.6)
Hemoglobin: 13.3 g/dL (ref 11.1–15.9)
Immature Grans (Abs): 0 10*3/uL (ref 0.0–0.1)
Immature Granulocytes: 0 %
Lymphocytes Absolute: 0.8 10*3/uL (ref 0.7–3.1)
Lymphs: 13 %
MCH: 30.1 pg (ref 26.6–33.0)
MCHC: 33.4 g/dL (ref 31.5–35.7)
MCV: 90 fL (ref 79–97)
Monocytes Absolute: 0.5 10*3/uL (ref 0.1–0.9)
Monocytes: 9 %
Neutrophils Absolute: 4.5 10*3/uL (ref 1.4–7.0)
Neutrophils: 75 %
Platelets: 204 10*3/uL (ref 150–450)
RBC: 4.42 x10E6/uL (ref 3.77–5.28)
RDW: 13.5 % (ref 11.7–15.4)
WBC: 6 10*3/uL (ref 3.4–10.8)

## 2019-09-22 LAB — TSH: TSH: 4.26 u[IU]/mL (ref 0.450–4.500)

## 2019-09-30 ENCOUNTER — Ambulatory Visit: Payer: Medicare Other | Admitting: Physical Therapy

## 2019-09-30 DIAGNOSIS — H35033 Hypertensive retinopathy, bilateral: Secondary | ICD-10-CM | POA: Diagnosis not present

## 2019-09-30 DIAGNOSIS — H26493 Other secondary cataract, bilateral: Secondary | ICD-10-CM | POA: Diagnosis not present

## 2019-09-30 DIAGNOSIS — H35013 Changes in retinal vascular appearance, bilateral: Secondary | ICD-10-CM | POA: Diagnosis not present

## 2019-09-30 DIAGNOSIS — H35362 Drusen (degenerative) of macula, left eye: Secondary | ICD-10-CM | POA: Diagnosis not present

## 2019-09-30 DIAGNOSIS — G35 Multiple sclerosis: Secondary | ICD-10-CM | POA: Diagnosis not present

## 2019-10-05 ENCOUNTER — Encounter: Payer: Self-pay | Admitting: Physical Therapy

## 2019-10-05 ENCOUNTER — Ambulatory Visit: Payer: Medicare Other | Attending: Neurology | Admitting: Physical Therapy

## 2019-10-05 ENCOUNTER — Other Ambulatory Visit: Payer: Self-pay

## 2019-10-05 DIAGNOSIS — M6281 Muscle weakness (generalized): Secondary | ICD-10-CM | POA: Diagnosis not present

## 2019-10-05 DIAGNOSIS — M545 Low back pain, unspecified: Secondary | ICD-10-CM

## 2019-10-05 DIAGNOSIS — R252 Cramp and spasm: Secondary | ICD-10-CM | POA: Diagnosis not present

## 2019-10-05 DIAGNOSIS — G8929 Other chronic pain: Secondary | ICD-10-CM | POA: Diagnosis not present

## 2019-10-05 DIAGNOSIS — R262 Difficulty in walking, not elsewhere classified: Secondary | ICD-10-CM | POA: Diagnosis not present

## 2019-10-06 ENCOUNTER — Encounter: Payer: Self-pay | Admitting: Physical Therapy

## 2019-10-06 NOTE — Therapy (Signed)
[place], [place], [address] Phone: [phone]   Fax:  [phone]  Physical Therapy Treatment  Patient Details  Name: Dawn [name] MRN: [id number] Date of Birth: 27-Mar-1946 Referring Provider (PT): Dr Christell Constant   Encounter Date: 10/05/2019  PT End of Session - 10/06/19 1038    Visit Number  18    Number of Visits  24    Date for PT Re-Evaluation  11/17/19    Authorization Type  Medicare Kx Modifier on visit 13 proress not on 10th visit    PT Start Time  1100    PT Stop Time  1144    PT Time Calculation (min)  44 min    Activity Tolerance  Patient tolerated treatment well    Behavior During Therapy  [facility] for tasks assessed/performed       Past Medical History:  Diagnosis Date  . Allergy   . Essential hypertension 08/25/2018  . Hot flashes, menopausal 10/13/2011   Estradiol started   . Memory loss 09/21/2019  . Multiple sclerosis ([place])   . Neuropathy   . Osteoarthritis   . Vision abnormalities     Past Surgical History:  Procedure Laterality Date  . ABDOMINAL HYSTERECTOMY  2002   partial  . LUMBAR FUSION      There were no vitals filed for this visit.  Subjective Assessment - 10/05/19 1104    Subjective  Patient reports her hips ahve been sore. She feels it may be the storm moving in. She has used her tennis ball and stretches to keep her pain at a good level.    Pertinent History  MS    How long can you stand comfortably?  hurts as soon as she stands. Has to shift frequently    How long can you walk comfortably?  limited community ambualtion    Diagnostic tests  Nothign recent    Currently in Pain?  Yes    Pain Score  6     Pain Location  Hip    Pain Orientation  Right    Pain Descriptors / Indicators  Aching    Pain Type  Chronic pain    Pain Onset  More than a month ago    Pain Frequency  Constant    Aggravating Factors   standing and walking    Pain Relieving Factors  PT     Effect of Pain on Daily Activities  difficulty walking distances         [facility] PT Assessment - 10/06/19 0001      AROM   Lumbar Flexion  69      Strength   Right Hip Flexion  4+/5    Right Hip ABduction  4+/5    Right Hip ADduction  4+/5    Left Hip Flexion  4+/5    Left Hip ABduction  4+/5    Left Hip ADduction  4+/5      High Level Balance   High Level Balance Comments  improved tandem stance stability. Therapy is progressing patient into balance exercises on the balanace mat                    [facility] Adult PT Treatment/Exercise - 10/06/19 0001      Lumbar Exercises: Stretches   Lower Trunk Rotation Limitations  x10 with cuing for technique     Piriformis Stretch Limitations  2x20 sec holdleft only today      Lumbar Exercises:  Seated   LAQ on Chair Limitations  2x10 green band     Other Seated Lumbar Exercises  hamstring curl 2x10 red band; clamshell x20 red band     Other Seated Lumbar Exercises  seated clamshell x20       Manual Therapy   Manual Therapy  Passive ROM    Manual therapy comments  skilled palpation of trigger points     Joint Mobilization  PA glides from L2 to L5     Soft tissue mobilization  to lumbar spine and upper gluteals bilateral     Passive ROM  focus on left hip hip flexionIR/ER        Trigger Point Dry Needling - 10/06/19 0001    Consent Given?  Yes    Education Handout Provided  Previously provided    Dry Needling Comments  3 needles to right gluteus medius     Gluteus Medius Response  Twitch response elicited           PT Education - 10/06/19 1037    Education Details  HEP and symptom mangement    Person(s) Educated  Patient    Methods  Explanation;Demonstration;Tactile cues;Verbal cues    Comprehension  Verbalized understanding;Returned demonstration;Verbal cues required;Tactile cues required       PT Short Term Goals - 10/06/19 1051      PT SHORT TERM GOAL #1   Title  Patient will report no pain radiating  into anterior thigh but still pain in the hip    Baseline  intemrittent but improved from last week    Time  4    Period  Weeks    Status  On-going    Target Date  05/24/19      PT SHORT TERM GOAL #2   Title  Patient will increase bilateral LE strength to 4/5    Baseline  4/5 gross    Time  4    Period  Weeks    Status  On-going    Target Date  05/24/19      PT SHORT TERM GOAL #3   Title  Patient will increase passive right hip flexion to 105 degrrees    Baseline  103 today without pain    Time  4    Period  Weeks    Status  On-going    Target Date  05/24/19        PT Long Term Goals - 08/11/19 1201      PT LONG TERM GOAL #1   Title  Patient will stand for 30 min without self reported increase pain in order to stand at a kitchen sink.    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 Therapy focused more on manual therapy today 2nd to increased baseline pain. Overal the patients balance is improving and her hip abduction has improved as well. Therapy will continue to progress balance exercise, gorss LEstrength, and ther-ex    Personal Factors and Comorbidities  Comorbidity 1;Comorbidity 2    Comorbidities  MS, OA    Examination-Activity Limitations  Locomotion Level;Carry;Stand;Lift;Stairs;Squat     Examination-Participation Restrictions  Meal Prep;Cleaning;Community Activity;Laundry;Shop    Stability/Clinical Decision Making  Evolving/Moderate complexity    Clinical Decision Making  Moderate    Rehab Potential  Good    PT Frequency  2x / week    PT Duration  8 weeks    PT Treatment/Interventions  ADLs/Self Care Home Management;Electrical Stimulation;Cryotherapy;Iontophoresis 4mg /ml Dexamethasone;Moist Heat;Traction;DME Instruction;Neuromuscular re-education;Patient/family education;Manual techniques;Passive range of motion;Taping    PT Next Visit Plan  continue to progress ther-ex as tolerated; review lifting technique    PT Home Exercise Plan  LTR; hamstring stretch, tennis ball trigger point release ; abdominal breathing    Consulted and Agree with Plan of Care  Patient       Patient will benefit from skilled therapeutic intervention in order to improve the following deficits and impairments:  Abnormal gait, Difficulty walking, Decreased range of motion, Decreased mobility, Decreased strength, Postural dysfunction, Pain, Increased muscle spasms  Visit Diagnosis: Chronic bilateral low back pain without sciatica  Muscle weakness (generalized)  Cramp and spasm  Difficulty in walking, not elsewhere classified     Problem List Patient Active Problem List   Diagnosis Date Noted  . Numbness 09/21/2019  . Bilateral carpal tunnel syndrome 09/21/2019  . High risk medication use 09/21/2019  . Memory loss 09/21/2019  . Essential hypertension 08/25/2018  . Abnormal SPEP 02/19/2018  . Hand pain 02/20/2017  . Multiple joint pain 02/18/2017  . Right elbow pain 12/09/2016  . Disturbed cognition 06/25/2016  . Trochanteric bursitis of left hip 02/18/2016  . Sciatica, right side 10/30/2015  . Bilateral arm pain 10/10/2015  . Trochanteric bursitis of right hip 08/04/2014  . Adjustment disorder with mixed anxiety and depressed mood 08/04/2014  . Chronic fatigue 08/04/2014  .  Urinary frequency 08/04/2014  . Abnormality of gait 08/04/2014  . Unspecified visual disturbance 01/31/2013  . Transient vision disturbance 01/31/2013  . Routine general medical examination at a health care facility 11/04/2011  . Colon polyps 11/03/2011  . Hot flashes, menopausal 10/13/2011  . POSTHERPETIC NEURALGIA 10/03/2009  . DISPLCMT LUMBAR INTERVERT [place] W/O MYELOPATHY 09/05/2009  . RENAL CALCULUS, RECURRENT 09/04/2009  . HYPERLIPIDEMIA 08/31/2009  . MULTIPLE SCLEROSIS, PROGRESSIVE/RELAPSING 08/31/2009  . ALLERGIC RHINITIS 08/31/2009    Carney Living  PT DPT  10/06/2019, 10:53 AM  [facility] [address] [place], [place], [address] Phone: [phone]   Fax:  [phone]  Name: Dawn [name] MRN: [id number] Date of Birth: 1945/08/22

## 2019-10-06 NOTE — Addendum Note (Signed)
Addended by: Carney Living on: 10/06/2019 12:53 PM   Modules accepted: Orders

## 2019-10-12 ENCOUNTER — Ambulatory Visit: Payer: Medicare Other | Admitting: Physical Therapy

## 2019-10-17 ENCOUNTER — Other Ambulatory Visit: Payer: Self-pay | Admitting: Internal Medicine

## 2019-10-17 ENCOUNTER — Ambulatory Visit (INDEPENDENT_AMBULATORY_CARE_PROVIDER_SITE_OTHER): Payer: Medicare Other | Admitting: Internal Medicine

## 2019-10-17 ENCOUNTER — Encounter: Payer: Self-pay | Admitting: Internal Medicine

## 2019-10-17 ENCOUNTER — Other Ambulatory Visit: Payer: Self-pay

## 2019-10-17 VITALS — BP 122/82 | HR 70 | Temp 97.8°F | Ht 62.0 in | Wt 192.5 lb

## 2019-10-17 DIAGNOSIS — N951 Menopausal and female climacteric states: Secondary | ICD-10-CM | POA: Diagnosis not present

## 2019-10-17 DIAGNOSIS — G5603 Carpal tunnel syndrome, bilateral upper limbs: Secondary | ICD-10-CM | POA: Diagnosis not present

## 2019-10-17 DIAGNOSIS — R5382 Chronic fatigue, unspecified: Secondary | ICD-10-CM

## 2019-10-17 DIAGNOSIS — E7800 Pure hypercholesterolemia, unspecified: Secondary | ICD-10-CM

## 2019-10-17 DIAGNOSIS — I10 Essential (primary) hypertension: Secondary | ICD-10-CM | POA: Diagnosis not present

## 2019-10-17 DIAGNOSIS — G35 Multiple sclerosis: Secondary | ICD-10-CM | POA: Diagnosis not present

## 2019-10-17 NOTE — Progress Notes (Signed)
Location:  [facility] clinic Provider:  [place] Bellemare L. Mariea Clonts, D.O., C.M.D.  Goals of Care:  Advanced Directives 10/17/2019  Does Patient Have a Medical Advance Directive? Yes  Type of Advance Directive Living will  Does patient want to make changes to medical advance directive? No - Patient declined  Copy of [place] in Chart? -     Chief Complaint  Patient presents with  . Medical Management of Chronic Issues    Follow up with labs after visit.     HPI: Patient is a 74 y.o. female seen today for medical management of chronic diseases.    She had a phone visit with [place] for her AWV.    She has been in isolation for a year.  Her usual MS stuff in the winter--pain.  Different things flaring up.  PT has been working on balance with her, weakness, and back pain.    She is for CTS testing due to progressive pain in the hands this winter.  As a little tenderness of her thumb joints now, but the warmth made a big difference.  She also has exercises with PT for this.  Mood has been pretty moldy but she's not had big depression.  She does have a pod with texting and emailing.  Some kind people bring her things.    Back remains terrible with her 4 herniated discs and stenosis.  She's been unable to do the tai chi and swimming which she's missed terribly.  She's sitting way too much.    She's had both pfizer vaccines.    She also notes more concerns with memory amid isolation from covid.  Loss of benchmarks for orientation.    Weight has gone up in the past 6 mos with isolation and takeout.  She got a bp cuff, but bp is always not good--over normal--160s-170s.  Here it was 122/82.  She is very good about avoiding salt. She is getting headaches on the right side and pounding.  She's doing deep breathing to lower it.  It varies all over the map.  Also stress related.  Stopped caffeinated coffee.    Past Medical History:  Diagnosis Date  . Allergy   . Essential hypertension  08/25/2018  . Hot flashes, menopausal 10/13/2011   Estradiol started   . Memory loss 09/21/2019  . Multiple sclerosis ([place])   . Neuropathy   . Osteoarthritis   . Vision abnormalities     Past Surgical History:  Procedure Laterality Date  . ABDOMINAL HYSTERECTOMY  2002   partial  . LUMBAR FUSION      No Known Allergies  Outpatient Encounter Medications as of 10/17/2019  Medication Sig  . aspirin EC 81 MG tablet Take 81 mg by mouth daily.  . calcium carbonate (OS-CAL) 600 MG TABS Take 600 mg by mouth daily.  . clonazePAM (KLONOPIN) 0.5 MG tablet Take 1 tablet by mouth at bedtime  . estradiol (CLIMARA - DOSED IN MG/24 HR) 0.025 mg/24hr patch Place 0.025 mg onto the skin once a week.  . fexofenadine (ALLEGRA) 180 MG tablet Take 180 mg by mouth daily.  [name] Kitchen leflunomide (ARAVA) 20 MG tablet TAKE ONE TABLET BY MOUTH DAILY  . losartan (COZAAR) 25 MG tablet TAKE 1 TABLET BY MOUTH EVERY DAY  . Misc Natural Products (SUPER-D3+) 5000 units CAPS Take by mouth daily.  . modafinil (PROVIGIL) 200 MG tablet TAKE 1 TABLET EVERY MORNING AND ONE-HALF TABLET EVERY AFTERNOON  . omeprazole (PRILOSEC) 40 MG capsule TAKE  ONE CAPSULE BY MOUTH DAILY  . Polyethylene Glycol 3350 (MIRALAX PO) Take by mouth daily as needed.   . progesterone (PROMETRIUM) 100 MG capsule TAKE 1 CAPSULE BY MOUTH EVERY DAY AT BEDTIME  . solifenacin (VESICARE) 5 MG tablet TAKE ONE TABLET BY MOUTH DAILY  . traMADol (ULTRAM) 50 MG tablet Take 1 tablet (50 mg total) by mouth at bedtime.   No facility-administered encounter medications on file as of 10/17/2019.    Review of Systems:  Review of Systems  Constitutional: Positive for malaise/fatigue. Negative for chills and fever.  HENT: Negative for congestion, hearing loss and sore throat.   Eyes: Negative for blurred vision.  Respiratory: Negative for cough and shortness of breath.   Cardiovascular: Negative for chest pain, palpitations and leg swelling.  Gastrointestinal: Negative for  abdominal pain, constipation and diarrhea.       Constipation better with flax seed--not needing colace or miralax  Genitourinary: Negative for dysuria.  Musculoskeletal: Positive for back pain and myalgias. Negative for falls.  Skin: Negative for itching and rash.  Neurological: Positive for tingling and sensory change. Negative for dizziness and loss of consciousness.  Endo/Heme/Allergies: Does not bruise/bleed easily.  Psychiatric/Behavioral: Positive for memory loss. Negative for depression. The patient is not nervous/anxious and does not have insomnia.     Health Maintenance  Topic Date Due  . PNA vac Low Risk Adult (2 of 2 - PPSV23) 08/31/2019  . DEXA SCAN  10/16/2020 (Originally 12/20/2012)  . Hepatitis C Screening  10/16/2020 (Originally 12/08/1945)  . TETANUS/TDAP  12/05/2019  . MAMMOGRAM  03/02/2021  . Fecal DNA (Cologuard)  09/28/2021  . INFLUENZA VACCINE  Completed    Physical Exam: Vitals:   10/17/19 1028  BP: 122/82  Pulse: 70  Temp: 97.8 F (36.6 C)  TempSrc: Temporal  SpO2: 98%  Weight: 192 lb 8 oz (87.3 kg)  Height: 5' 2" (1.575 m)   Body mass index is 35.21 kg/m. Physical Exam Vitals reviewed.  Constitutional:      Appearance: Normal appearance.  Cardiovascular:     Rate and Rhythm: Normal rate and regular rhythm.     Pulses: Normal pulses.     Heart sounds: Normal heart sounds.  Pulmonary:     Effort: Pulmonary effort is normal.     Breath sounds: Normal breath sounds. No wheezing, rhonchi or rales.  Abdominal:     General: Bowel sounds are normal.  Neurological:     Mental Status: She is alert and oriented to person, place, and time. Mental status is at baseline.     Comments: Ambulates w/o assistive device  Psychiatric:        Mood and Affect: Mood normal.        Behavior: Behavior normal.     Labs reviewed: Basic Metabolic Panel: Recent Labs    03/23/19 1053 09/21/19 1037  NA 139 140  K 4.6 4.4  CL 103 104  CO2 22 23  GLUCOSE 77 84   BUN 14 15  CREATININE 0.67 0.78  CALCIUM 9.8 9.4  TSH  --  4.260   Liver Function Tests: Recent Labs    03/23/19 1053 09/21/19 1037  AST 19 24  ALT 18 24  ALKPHOS 134* 147*  BILITOT 0.3 0.3  PROT 6.0 6.2  ALBUMIN 4.2 4.3   No results for input(s): LIPASE, AMYLASE in the last 8760 hours. No results for input(s): AMMONIA in the last 8760 hours. CBC: Recent Labs    03/23/19 1053 09/21/19 1037  WBC   5.2 6.0  NEUTROABS 3.8 4.5  HGB 13.5 13.3  HCT 41.1 39.8  MCV 90 90  PLT 221 204    Assessment/Plan 1. MULTIPLE SCLEROSIS, PROGRESSIVE/RELAPSING -continues to follow with Dr. Felecia Shelling  -cont leflunomide, klonopin and provigil   2. Bilateral carpal tunnel syndrome -await EMG/NCS thru neuro  3. Essential hypertension -bp at home running high--she will send me results of her home checks via mychart -here bp was good -has made conservative changes on her own   4. Hot flashes, menopausal -continues on estradiol patch and progesterone long-term  5. Pure hypercholesterolemia - here for lab fasting today also - Lipid panel  6. Chronic fatigue -continues due to MS, cont provigil for this  Labs/tests ordered:   Lab Orders     Lipid panel  Next appt:  6 mos for med mgt  Chisom Muntean L. Zani Kyllonen, D.O. [facility] [address], [place] [address] Cell Phone (Mon-Fri 8am-5pm):  [phone] On Call:  [phone] & follow prompts after 5pm & weekends Office Phone:  [phone] Office Fax:  [phone]

## 2019-10-17 NOTE — Patient Instructions (Addendum)
Please bring me the readings of your blood pressure next time so we can see how that is really running.  You could also send me a week's worth in mychart so we can look at this sooner.    Please call [facility] and get your shingrix arranged.

## 2019-10-18 ENCOUNTER — Ambulatory Visit (INDEPENDENT_AMBULATORY_CARE_PROVIDER_SITE_OTHER): Payer: Medicare Other | Admitting: Neurology

## 2019-10-18 ENCOUNTER — Telehealth: Payer: Self-pay | Admitting: Neurology

## 2019-10-18 ENCOUNTER — Encounter (INDEPENDENT_AMBULATORY_CARE_PROVIDER_SITE_OTHER): Payer: Medicare Other | Admitting: Neurology

## 2019-10-18 DIAGNOSIS — M5412 Radiculopathy, cervical region: Secondary | ICD-10-CM | POA: Diagnosis not present

## 2019-10-18 DIAGNOSIS — R200 Anesthesia of skin: Secondary | ICD-10-CM

## 2019-10-18 DIAGNOSIS — G35 Multiple sclerosis: Secondary | ICD-10-CM | POA: Diagnosis not present

## 2019-10-18 DIAGNOSIS — G5603 Carpal tunnel syndrome, bilateral upper limbs: Secondary | ICD-10-CM | POA: Diagnosis not present

## 2019-10-18 DIAGNOSIS — Z0289 Encounter for other administrative examinations: Secondary | ICD-10-CM

## 2019-10-18 LAB — LIPID PANEL
Cholesterol: 242 mg/dL — ABNORMAL HIGH (ref <200)
HDL: 47 mg/dL — ABNORMAL LOW (ref >=50)
LDL Cholesterol (Calc): 161 mg/dL (calc) — ABNORMAL HIGH
Non-HDL Cholesterol (Calc): 195 mg/dL (calc) — ABNORMAL HIGH (ref <130)
Total CHOL/HDL Ratio: 5.1 (calc) — ABNORMAL HIGH (ref <5.0)
Triglycerides: 187 mg/dL — ABNORMAL HIGH (ref <150)

## 2019-10-18 NOTE — Telephone Encounter (Signed)
Medicare/mutual of [place] order sent to GI. No auth they will reach out to the patient to schedule.  

## 2019-10-18 NOTE — Progress Notes (Signed)
Full Name: Dawn [name] Gender: Female MRN #: [id number] Date of Birth: 1945/12/25    Visit Date: 10/18/2019 07:27 Age: 74 Years Examining Physician: Arlice Colt, MD  Referring Physician: Arlice Colt, MD    History: [name] is a 74 year old woman with multiple sclerosis who has painful dysesthesias in both hands, right greater than left, radiating up towards the elbow.  On exam, she had 4+ strength in the intrinsic hand muscles on the right more than the left.  Strength was also 4+ in the right triceps but normal on the left.   Nerve conduction studies: Bilateral median and ulnar motor responses had normal distal latencies, amplitudes and forearm conduction velocities.  Bilateral median and ulnar sensory responses had normal peak latencies and amplitudes.  Bilateral ulnar F-wave latencies were normal.  Electromyography: Needle EMG of selected muscles of the right arm was performed.  There was mild chronic denervation in the triceps and EDC muscles and a few polyphasic motor units with normal recruitment in the FCU and biceps.  There was no abnormal spontaneous activity in any of the muscles tested.  Impression: This NCV/EMG study shows the following: 1.  Mild chronic right C7 radiculopathy. 2.  No evidence of median or ulnar neuropathies or generalized polyneuropathy in the arms  [place] Nordquist A. Felecia Shelling, MD, PhD, FAAN Certified in Neurology, Clinical Neurophysiology, Sleep Medicine, Pain Medicine and Neuroimaging Director, [facility] [address], [place], [place] [address] [phone]             [facility]    Nerve / Sites Muscle Latency Ref. Amplitude Ref. Rel Amp Segments Distance Velocity Ref. Area    ms ms mV mV %  cm m/s m/s mVms  L Median - APB     Wrist APB 3.6 ?4.4 6.1 ?4.0 100 Wrist - APB 7   23.8     Upper arm APB 7.9  5.1  82.5 Upper arm - Wrist 22 52 ?49 20.2  R  Median - APB     Wrist APB 3.7 ?4.4 5.8 ?4.0 100 Wrist - APB 7   18.1     Upper arm APB 7.8  5.3  90.3 Upper arm - Wrist 21 52 ?49 18.0  L Ulnar - ADM     Wrist ADM 3.0 ?3.3 11.3 ?6.0 100 Wrist - ADM 7   40.8     B.Elbow ADM 6.1  10.9  96 B.Elbow - Wrist 21 68 ?49 38.7     A.Elbow ADM 7.6  10.7  97.9 A.Elbow - B.Elbow 10 66 ?49 37.7  R Ulnar - ADM     Wrist ADM 3.1 ?3.3 11.4 ?6.0 100 Wrist - ADM 7   44.0     B.Elbow ADM 6.7  10.0  87.5 B.Elbow - Wrist 21 59 ?49 40.1     A.Elbow ADM 8.4  10.2  103 A.Elbow - B.Elbow 10 60 ?49 40.7             SNC    Nerve / Sites Rec. Site Peak Lat Ref.  Amp Ref. Segments Distance Peak Diff Ref.    ms ms V V  cm ms ms  L Median, Ulnar - Transcarpal comparison     Median Palm Wrist 2.1 ?2.2 72 ?35 Median Palm - Wrist 8       Ulnar Palm Wrist 2.1 ?2.2 32 ?12 Ulnar Palm - Wrist 8  Median Palm - Ulnar Palm  0.1 ?0.4  R Median, Ulnar - Transcarpal comparison     Median Palm Wrist 2.2 ?2.2 80 ?35 Median Palm - Wrist 8       Ulnar Palm Wrist 2.2 ?2.2 20 ?12 Ulnar Palm - Wrist 8          Median Palm - Ulnar Palm  0.0 ?0.4  L Median - Orthodromic (Dig II, Mid palm)     Dig II Wrist 3.3 ?3.4 11 ?10 Dig II - Wrist 13    R Median - Orthodromic (Dig II, Mid palm)     Dig II Wrist 3.4 ?3.4 11 ?10 Dig II - Wrist 13    L Ulnar - Orthodromic, (Dig V, Mid palm)     Dig V Wrist 3.1 ?3.1 5 ?5 Dig V - Wrist 11    R Ulnar - Orthodromic, (Dig V, Mid palm)     Dig V Wrist 3.1 ?3.1 7 ?5 Dig V - Wrist 56                   F  Wave    Nerve F Lat Ref.   ms ms  L Ulnar - ADM 27.9 ?32.0  R Ulnar - ADM 28.3 ?32.0         EMG Summary Table    Spontaneous MUAP Recruitment  Muscle IA Fib PSW Fasc Other Amp Dur. Poly Pattern  R. Deltoid Normal None None None _______ Normal Normal Normal Normal  R. Triceps brachii Normal None None None _______ Increased Increased 1+ Reduced  R. Biceps brachii Normal None None None _______ Normal Normal 1+ Normal  R. Extensor  digitorum communis Normal None None None _______ Normal Increased 1+ Reduced  R. Flexor carpi ulnaris Normal None None None _______ Normal Normal 1+ Normal  R. First dorsal interosseous Normal None None None _______ Normal Normal Normal Normal  R. Abductor pollicis brevis Normal None None None _______ Normal Normal Normal Normal         [facility]  PATIENT: Dawn [name] DOB: 1945-12-09  REFERRING CLINICIAN: Aura Dials HISTORY FROM: Patient REASON FOR VISIT: MS   HISTORICAL  CHIEF COMPLAINT:  Chief Complaint  Patient presents with  . Numbness    HISTORY OF PRESENT ILLNESS:  Dawn [name] is a 74 y.o. woman who was diagnosed with multiple sclerosis in 2001.   Additionally, she is undergoing evaluation for bilateral hand/arm numbness and dysesthesias.  Update 10/18/2019: She continues to note pain both hands with dysesthesia.   She drops things at times.  The right is worse than the left.    The pain wakes her up at night some nights.   Her leg pain is also waking her up at night.   Pain is worse at night but still bothers her during the day.   In the past, gabapentin had been tried but she does not recall if there was any benefit.   One half of the tramadol helps the pain but she notes some cognitive changes so has not taken on a regular basis..     Her MS is stable without exacerbations.   She is on Leflunomide and tolerates it well.    Insurance had not covered Aubagio well for her in the past    She has insomnia, helped by clonazepam.    Her last ESI did not help the LBP/leg pain.   PT with needling has helped.   MS History:  In 1994, she had an episode of severe vertigo with gait ataxia. An MRI at that time was reportedly normal. The symptoms were felt to be due to allergies. About 7 years later she had vertigo, gait ataxia and diplopia. Also her left leg was giving out. Short after that she had a hysterectomy. Postoperatively, she was numb in  both legs and she had a lumbar puncture which showed changes consistent with MS. Then, an MRI of the brain was performed showing changes consistent with MS.   She started on Betaseron. Last year, she switched from Betaseron to Tecfidera, in the hope that she would be able to avoid shots as she was having severe skin reactions with knots.    However, she had a lot of difficulty tolerating Tecfidera and swithced to [place] in early 2016 and to [place] late 2016.  Due to insurance issues, she switched from Aubagio to leflunomide in 2019.  Recent imaging studies: MRI of the brain 09/13/2018 showed foci in the hemispheres consistent with MS.  There was no significant change compared to the 2017 MRI.  There was also a remote left cerebellar infarction.   REVIEW OF SYSTEMS:  Constitutional: No fevers, chills, sweats, or change in appetite.   Reports fatigue Eyes: No visual changes, double vision, eye pain Ear, nose and throat: No hearing loss, ear pain, nasal congestion, sore throat Cardiovascular: No chest pain, palpitations Respiratory:  No shortness of breath at rest or with exertion.   No wheezes GastrointestinaI: No nausea, vomiting, diarrhea, abdominal pain, fecal incontinence Genitourinary:  see above. Musculoskeletal:  Pain in left greater than right hip Integumentary: No rash, pruritus.    Neurological: as above Psychiatric: No depression at this time.  No anxiety Endocrine: No palpitations, diaphoresis, change in appetite, change in weigh or increased thirst Hematologic/Lymphatic:  No anemia, purpura, petechiae. Allergic/Immunologic: No itchy/runny eyes, nasal congestion, recent allergic reactions, rashes  ALLERGIES: No Known Allergies  HOME MEDICATIONS: Outpatient Medications Prior to Visit  Medication Sig Dispense Refill  . aspirin EC 81 MG tablet Take 81 mg by mouth daily.    . calcium carbonate (OS-CAL) 600 MG TABS Take 600 mg by mouth daily.    . clonazePAM (KLONOPIN) 0.5 MG  tablet Take 1 tablet by mouth at bedtime 90 tablet 1  . estradiol (CLIMARA - DOSED IN MG/24 HR) 0.025 mg/24hr patch Place 0.025 mg onto the skin once a week.    . fexofenadine (ALLEGRA) 180 MG tablet Take 180 mg by mouth daily.    [name] Kitchen leflunomide (ARAVA) 20 MG tablet TAKE ONE TABLET BY MOUTH DAILY 90 tablet 4  . losartan (COZAAR) 25 MG tablet TAKE 1 TABLET BY MOUTH EVERY DAY 90 tablet 1  . Misc Natural Products (SUPER-D3+) 5000 units CAPS Take by mouth daily.    . modafinil (PROVIGIL) 200 MG tablet TAKE 1 TABLET EVERY MORNING AND ONE-HALF TABLET EVERY AFTERNOON 135 tablet 1  . omeprazole (PRILOSEC) 40 MG capsule TAKE ONE CAPSULE BY MOUTH DAILY 90 capsule 1  . Polyethylene Glycol 3350 (MIRALAX PO) Take by mouth daily as needed.     . progesterone (PROMETRIUM) 100 MG capsule TAKE 1 CAPSULE BY MOUTH EVERY DAY AT BEDTIME  3  . solifenacin (VESICARE) 5 MG tablet TAKE ONE TABLET BY MOUTH DAILY 90 tablet 1  . traMADol (ULTRAM) 50 MG tablet Take 1 tablet (50 mg total) by mouth at bedtime. 30 tablet 5   No facility-administered medications prior to visit.    PAST MEDICAL HISTORY: Past Medical  History:  Diagnosis Date  . Allergy   . Essential hypertension 08/25/2018  . Hot flashes, menopausal 10/13/2011   Estradiol started   . Memory loss 09/21/2019  . Multiple sclerosis ([place])   . Neuropathy   . Osteoarthritis   . Vision abnormalities     PAST SURGICAL HISTORY: Past Surgical History:  Procedure Laterality Date  . ABDOMINAL HYSTERECTOMY  2002   partial  . LUMBAR FUSION      FAMILY HISTORY: Family History  Problem Relation Age of Onset  . Heart failure Mother   . Heart failure Father   . Arthritis Other   . Hypertension Other       PHYSICAL EXAM  There were no vitals filed for this visit.  There is no height or weight on file to calculate BMI.   General: The patient is well-developed and well-nourished and in no acute distress  Neurologic Exam  Mental status: The patient is  alert and oriented with an apparently normal attention span and concentration ability.   Speech is normal.      Cranial nerves: Extraocular movements are full.  Facial strength and sensory are normal.  Hearing appeared to be symmetric  Motor:  Muscle bulk is normal but tone is symmetric, more on the left.. Strength is 4+/5 in median innervated and 4+/5 and ulnar innervated intrinsic hand muscles strength 4+/5 right triceps normal elsewhere in the arms   Sensory: She has reduced sensation over the thenar eminences compared to the hyperthenar eminences of the hands.  Sensory testing is intact to touch and vibration in the arms.   Reflexes: Deep tendon reflexes are symmetric and normal bilaterally.      DIAGNOSTIC DATA (LABS, IMAGING, TESTING) - I reviewed patient records, labs, notes, testing and imaging myself where available.  Lab Results  Component Value Date   WBC 6.0 09/21/2019   HGB 13.3 09/21/2019   HCT 39.8 09/21/2019   MCV 90 09/21/2019   PLT 204 09/21/2019      Component Value Date/Time   NA 140 09/21/2019 1037   K 4.4 09/21/2019 1037   CL 104 09/21/2019 1037   CO2 23 09/21/2019 1037   GLUCOSE 84 09/21/2019 1037   GLUCOSE 88 03/09/2018 1342   BUN 15 09/21/2019 1037   CREATININE 0.78 09/21/2019 1037   CREATININE 0.75 03/09/2018 1342   CALCIUM 9.4 09/21/2019 1037   PROT 6.2 09/21/2019 1037   ALBUMIN 4.3 09/21/2019 1037   AST 24 09/21/2019 1037   AST 20 03/09/2018 1342   ALT 24 09/21/2019 1037   ALT 22 03/09/2018 1342   ALKPHOS 147 (H) 09/21/2019 1037   BILITOT 0.3 09/21/2019 1037   BILITOT 0.4 03/09/2018 1342   GFRNONAA 77 09/21/2019 1037   GFRNONAA >60 03/09/2018 1342   GFRAA 88 09/21/2019 1037   GFRAA >60 03/09/2018 1342   Lab Results  Component Value Date   CHOL 242 (H) 10/17/2019   HDL 47 (L) 10/17/2019   LDLCALC 161 (H) 10/17/2019   LDLDIRECT 130.2 11/03/2011   TRIG 187 (H) 10/17/2019   CHOLHDL 5.1 (H) 10/17/2019   No results found for: HGBA1C No  results found for: VITAMINB12 Lab Results  Component Value Date   TSH 4.260 09/21/2019       ASSESSMENT AND PLAN  Cervical radiculopathy  Bilateral carpal tunnel syndrome - Plan: NCV with EMG(electromyography)  Numbness - Plan: NCV with EMG(electromyography)   1.    EMG was consistent with a right C7 chronic radiculopathy though that does  not explain all of her arm symptoms.  She could also have had progression of the MS.  Due to progression of symptoms we will check an MRI of the cervical spine and brain. 2.    She will continue leflunomide for MS.   Check labs.   3.    Continue physical therapy.   4.    Okay to take 1/2-1 tramadol at night to help with the pain.   5.    Return to see me in about 6 months but is advised to call sooner if she has new or worsening neurologic symptoms.   Kirti Carl A. Felecia Shelling, MD, PhD [id number], [id number] AM Certified in Neurology, Clinical Neurophysiology, Sleep Medicine, Pain Medicine and Neuroimaging  [facility] [address] [place], [place] [address] [phone] \

## 2019-10-18 NOTE — Progress Notes (Signed)
Cholesterol is very high.  Bad (LDL) is 161 with goal of less than 100.   Triglycerides (starchy fats) are 187, also above goal of 150. Good cholesterol (HDL) is low at 47.

## 2019-10-19 ENCOUNTER — Other Ambulatory Visit: Payer: Self-pay

## 2019-10-19 ENCOUNTER — Other Ambulatory Visit: Payer: Self-pay | Admitting: Neurology

## 2019-10-19 ENCOUNTER — Ambulatory Visit: Payer: Medicare Other | Admitting: Physical Therapy

## 2019-10-19 ENCOUNTER — Encounter: Payer: Self-pay | Admitting: Physical Therapy

## 2019-10-19 DIAGNOSIS — G8929 Other chronic pain: Secondary | ICD-10-CM

## 2019-10-19 DIAGNOSIS — R262 Difficulty in walking, not elsewhere classified: Secondary | ICD-10-CM | POA: Diagnosis not present

## 2019-10-19 DIAGNOSIS — M545 Low back pain, unspecified: Secondary | ICD-10-CM

## 2019-10-19 DIAGNOSIS — M6281 Muscle weakness (generalized): Secondary | ICD-10-CM

## 2019-10-19 DIAGNOSIS — R252 Cramp and spasm: Secondary | ICD-10-CM

## 2019-10-19 DIAGNOSIS — I10 Essential (primary) hypertension: Secondary | ICD-10-CM

## 2019-10-19 NOTE — Therapy (Signed)
[place], [place], [address] Phone: [phone]   Fax:  [phone]  Physical Therapy Treatment  Patient Details  Name: Dawn [name] MRN: [id number] Date of Birth: [birth year] Referring Provider (PT): Dr Christell Constant   Encounter Date: 10/19/2019  PT End of Session - 10/19/19 1722    Visit Number  19    Number of Visits  24    Date for PT Re-Evaluation  11/17/19    Authorization Type  Medicare Kx Modifier on visit 13 proress not on 10th visit    PT Start Time  1100    PT Stop Time  1141    PT Time Calculation (min)  41 min    Activity Tolerance  Patient tolerated treatment well    Behavior During Therapy  [facility] for tasks assessed/performed       Past Medical History:  Diagnosis Date  . Allergy   . Essential hypertension 08/25/2018  . Hot flashes, menopausal 10/13/2011   Estradiol started   . Memory loss 09/21/2019  . Multiple sclerosis ([place])   . Neuropathy   . Osteoarthritis   . Vision abnormalities     Past Surgical History:  Procedure Laterality Date  . ABDOMINAL HYSTERECTOMY  2002   partial  . LUMBAR FUSION      There were no vitals filed for this visit.  Subjective Assessment - 10/19/19 1138    Subjective  Patient missed las tvist. She reports some soreness today. She has been seen for an EMG of her hands.    How long can you stand comfortably?  hurts as soon as she stands. Has to shift frequently    How long can you walk comfortably?  limited community ambualtion    Diagnostic tests  Nothign recent    Currently in Pain?  Yes    Pain Score  4     Pain Location  Hip    Pain Orientation  Right    Pain Descriptors / Indicators  Aching    Pain Type  Chronic pain    Pain Onset  More than a month ago    Pain Frequency  Constant    Aggravating Factors   standing and walking    Pain Relieving Factors  Physcial therapy    Effect of Pain on Daily Activities  difficulty walking distances                        [facility] Adult PT Treatment/Exercise - 10/19/19 0001      High Level Balance   High Level Balance Comments  improved tandem stance stability. Therapy is progressing patient into balance exercises on the balanace mat       Lumbar Exercises: Stretches   Active Hamstring Stretch  3 reps;20 seconds    Lower Trunk Rotation Limitations  x20 with cuing for technique     Piriformis Stretch Limitations  2x20 sec holdleft only today      Lumbar Exercises: Standing   Heel Raises Limitations  2x10 with rocking back on her heels for psoterior balance training       Lumbar Exercises: Seated   Other Seated Lumbar Exercises  seated clamshell x20       Manual Therapy   Manual Therapy  Passive ROM    Manual therapy comments  skilled palpation of trigger points     Joint Mobilization  PA glides from L2 to L5     Soft tissue mobilization  to lumbar spine and upper gluteals bilateral     Passive ROM  focus on left hip hip flexionIR/ER        Trigger Point Dry Needling - 10/19/19 0001    Consent Given?  Yes    Education Handout Provided  Previously provided    Dry Needling Comments  2 needles to right gluteus medius     Gluteus Medius Response  Twitch response elicited             PT Short Term Goals - 10/06/19 1051      PT SHORT TERM GOAL #1   Title  Patient will report no pain radiating into anterior thigh but still pain in the hip    Baseline  intemrittent but improved from last week    Time  4    Period  Weeks    Status  On-going    Target Date  05/24/19      PT SHORT TERM GOAL #2   Title  Patient will increase bilateral LE strength to 4/5    Baseline  4/5 gross    Time  4    Period  Weeks    Status  On-going    Target Date  05/24/19      PT SHORT TERM GOAL #3   Title  Patient will increase passive right hip flexion to 105 degrrees    Baseline  103 today without pain    Time  4    Period  Weeks    Status  On-going    Target Date   05/24/19        PT Long Term Goals - 08/11/19 1201      PT LONG TERM GOAL #1   Title  Patient will stand for 30 min without self reported increase pain in order to stand at a kitchen sink.    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 She was able to maintain tandem stance for 30 sec without assist. Therapy will continue to progress patient as tolerated. She reported a significant improvement in pain after treatment.    Personal Factors and Comorbidities  Comorbidity 1;Comorbidity 2    Comorbidities  MS, OA    Examination-Activity Limitations  Locomotion Level;Carry;Stand;Lift;Stairs;Squat    Examination-Participation Restrictions  Meal Prep;Cleaning;Community Activity;Laundry;Shop    Stability/Clinical Decision Making  Evolving/Moderate complexity    Clinical Decision Making  Moderate    Rehab Potential  Good    PT Frequency  2x / week    PT Duration  8 weeks    PT Treatment/Interventions  ADLs/Self Care Home Management;Electrical Stimulation;Cryotherapy;Iontophoresis 4mg /ml Dexamethasone;Moist Heat;Traction;DME Instruction;Neuromuscular re-education;Patient/family education;Manual techniques;Passive range of motion;Taping    PT Next Visit Plan  continue to progress ther-ex as tolerated; review lifting technique    PT Home  Exercise Plan  LTR; hamstring stretch, tennis ball trigger point release ; abdominal breathing    Consulted and Agree with Plan of Care  Patient       Patient will benefit from skilled therapeutic intervention in order to improve the following deficits and impairments:  Abnormal gait, Difficulty walking, Decreased range of motion, Decreased mobility, Decreased strength, Postural dysfunction, Pain, Increased muscle spasms  Visit Diagnosis: Chronic bilateral low back pain without sciatica  Muscle weakness (generalized)  Cramp and spasm  Difficulty in walking, not elsewhere classified     Problem List Patient Active Problem List   Diagnosis Date Noted  . Cervical radiculopathy 10/18/2019  . Numbness 09/21/2019  . Bilateral carpal tunnel syndrome 09/21/2019  . High risk medication use 09/21/2019  . Memory loss 09/21/2019  . Essential hypertension 08/25/2018  . Abnormal SPEP 02/19/2018  . Hand pain 02/20/2017  . Multiple joint pain 02/18/2017  . Right elbow pain 12/09/2016  . Disturbed cognition 06/25/2016  . Trochanteric bursitis of left hip 02/18/2016  . Sciatica, right side 10/30/2015  . Bilateral arm pain 10/10/2015  . Trochanteric bursitis of right hip 08/04/2014  . Adjustment disorder with mixed anxiety and depressed mood 08/04/2014  . Chronic fatigue 08/04/2014  . Urinary frequency 08/04/2014  . Abnormality of gait 08/04/2014  . Unspecified visual disturbance 01/31/2013  . Transient vision disturbance 01/31/2013  . Routine general medical examination at a health care facility 11/04/2011  . Colon polyps 11/03/2011  . Hot flashes, menopausal 10/13/2011  . POSTHERPETIC NEURALGIA 10/03/2009  . DISPLCMT LUMBAR INTERVERT [place] W/O MYELOPATHY 09/05/2009  . RENAL CALCULUS, RECURRENT 09/04/2009  . Hyperlipidemia 08/31/2009  . MULTIPLE SCLEROSIS, PROGRESSIVE/RELAPSING 08/31/2009  . ALLERGIC RHINITIS 08/31/2009    Carney Living PT DPT  10/19/2019, 5:32 PM  [facility] [address] [place], [place], [address] Phone: [phone]   Fax:  [phone]  Name: Dawn [name] MRN: [id number] Date of Birth: [birth year]

## 2019-10-20 ENCOUNTER — Telehealth: Payer: Self-pay | Admitting: Neurology

## 2019-10-20 MED ORDER — OMEPRAZOLE 40 MG PO CPDR: 40.0000 mg | DELAYED_RELEASE_CAPSULE | Freq: Every day | ORAL | 1 refills | Status: DC

## 2019-10-20 NOTE — Telephone Encounter (Signed)
Pt has called for a refill on her omeprazole (PRILOSEC) 40 MG capsule [facility] [phone]

## 2019-10-20 NOTE — Addendum Note (Signed)
Addended by: Wyvonnia Lora on: 10/20/2019 12:09 PM   Modules accepted: Orders

## 2019-10-20 NOTE — Telephone Encounter (Signed)
E-scribed rx to pharmacy.  

## 2019-10-27 ENCOUNTER — Ambulatory Visit: Payer: Medicare Other | Attending: Neurology | Admitting: Physical Therapy

## 2019-10-27 ENCOUNTER — Other Ambulatory Visit: Payer: Self-pay

## 2019-10-27 ENCOUNTER — Encounter: Payer: Self-pay | Admitting: Physical Therapy

## 2019-10-27 DIAGNOSIS — R252 Cramp and spasm: Secondary | ICD-10-CM | POA: Insufficient documentation

## 2019-10-27 DIAGNOSIS — R262 Difficulty in walking, not elsewhere classified: Secondary | ICD-10-CM | POA: Diagnosis not present

## 2019-10-27 DIAGNOSIS — G8929 Other chronic pain: Secondary | ICD-10-CM

## 2019-10-27 DIAGNOSIS — M6281 Muscle weakness (generalized): Secondary | ICD-10-CM | POA: Diagnosis not present

## 2019-10-27 DIAGNOSIS — M545 Low back pain: Secondary | ICD-10-CM | POA: Insufficient documentation

## 2019-10-27 NOTE — Therapy (Signed)
[name], [place], [address] Phone: [phone]   Fax:  [phone]  Physical Therapy Treatment  Patient Details  Name: Dawn [name] MRN: [id number] Date of Birth: 25-Aug-1945 Referring Provider (PT): Dr Christell Constant   Progress Note Reporting Period 06/23/2019 to 10/27/2019  See note below for Objective Data and Assessment of Progress/Goals.        Encounter Date: 10/27/2019  PT End of Session - 10/27/19 1405    Visit Number  20    Number of Visits  24    Date for PT Re-Evaluation  11/17/19    Authorization Type  Medicare Kx Modifier on visit 13 proress not on 30th visit    PT Start Time  1330    PT Stop Time  1412    PT Time Calculation (min)  42 min    Activity Tolerance  Patient tolerated treatment well    Behavior During Therapy  WFL for tasks assessed/performed         Past Medical History:  Diagnosis Date  . Allergy   . Essential hypertension 08/25/2018  . Hot flashes, menopausal 10/13/2011   Estradiol started   . Memory loss 09/21/2019  . Multiple sclerosis ([place])   . Neuropathy   . Osteoarthritis   . Vision abnormalities     Past Surgical History:  Procedure Laterality Date  . ABDOMINAL HYSTERECTOMY  2002   partial  . LUMBAR FUSION      There were no vitals filed for this visit.  Subjective Assessment - 10/27/19 1358    Subjective  Patient report sher hip has been doing well. She is starting to have some pain. She ussualy does this time of the day.    How long can you stand comfortably?  hurts as soon as she stands. Has to shift frequently    How long can you walk comfortably?  limited community ambualtion    Diagnostic tests  Nothign recent    Currently in Pain?  Yes    Pain Score  3     Pain Location  Hip    Pain Orientation  Right    Pain Descriptors / Indicators  Aching    Pain Radiating Towards  Standing and walking    Pain Onset  More than a month ago    Pain  Frequency  Constant    Aggravating Factors   standing and walking    Pain Relieving Factors  Physcial therapy    Effect of Pain on Daily Activities  difficulty walking distances         [facility] PT Assessment - 10/27/19 0001      Strength   Right Hip Flexion  4+/5    Right Hip ABduction  4+/5    Right Hip ADduction  4+/5    Left Hip Flexion  4+/5    Left Hip ABduction  4+/5    Left Hip ADduction  4+/5    Right Knee Flexion  4+/5    Right Knee Extension  4+/5    Left Knee Flexion  4+/5    Left Knee Extension  4+/5      High Level Balance   High Level Balance Comments  Patient continues to have improved balance. Today she was able to perfrom high level balance exercises on the ari-ex. She required min a for narrow base with eyes closed. She also required min a stepping on the air-ex. Step on x10 each leg with min ue assist.  Hell raise to rock back without assist 2x10                    [facility] Adult PT Treatment/Exercise - 10/27/19 0001      Lumbar Exercises: Stretches   Active Hamstring Stretch  3 reps;20 seconds    Lower Trunk Rotation Limitations  x20 with cuing for technique     Piriformis Stretch Limitations  2x20 sec holdleft only today      Manual Therapy   Manual Therapy  Passive ROM    Manual therapy comments  skilled palpation of trigger points     Joint Mobilization  PA glides from L2 to L5     Soft tissue mobilization  to lumbar spine and upper gluteals bilateral     Passive ROM  focus on left hip hip flexionIR/ER        Trigger Point Dry Needling - 10/27/19 0001    Consent Given?  Yes    Dry Needling Comments  3 needle to right gluteal 1 to right lumabar parapinal     Gluteus Medius Response  Twitch response elicited           PT Education - 10/27/19 1402    Education Details  balance exercises    Person(s) Educated  Patient    Methods  Explanation;Demonstration;Tactile cues;Verbal cues    Comprehension  Verbalized understanding;Verbal cues  required;Returned demonstration;Tactile cues required       PT Short Term Goals - 10/06/19 1051      PT SHORT TERM GOAL #1   Title  Patient will report no pain radiating into anterior thigh but still pain in the hip    Baseline  intemrittent but improved from last week    Time  4    Period  Weeks    Status  On-going    Target Date  05/24/19      PT SHORT TERM GOAL #2   Title  Patient will increase bilateral LE strength to 4/5    Baseline  4/5 gross    Time  4    Period  Weeks    Status  On-going    Target Date  05/24/19      PT SHORT TERM GOAL #3   Title  Patient will increase passive right hip flexion to 105 degrrees    Baseline  103 today without pain    Time  4    Period  Weeks    Status  On-going    Target Date  05/24/19        PT Long Term Goals - 08/11/19 1201      PT LONG TERM GOAL #1   Title  Patient will stand for 30 min without self reported increase pain in order to stand at a kitchen sink.    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 The patient is having less pain as the day goes on and has been having less flairs of significant pain. She would benefit from further skilled therapy to continue to progress her balance and to continue to improve her strength and  stamina. She tolerated treatment well today and had a good twtich response with needling.    Personal Factors and Comorbidities  Comorbidity 1;Comorbidity 2    Comorbidities  MS, OA    Examination-Activity Limitations  Locomotion Level;Carry;Stand;Lift;Stairs;Squat    Examination-Participation Restrictions  Meal Prep;Cleaning;Community Activity;Laundry;Shop    Stability/Clinical Decision Making  Evolving/Moderate complexity    Clinical Decision Making  Moderate    Rehab Potential  Good    PT Frequency  2x / week    PT Treatment/Interventions  ADLs/Self Care Home Management;Electrical Stimulation;Cryotherapy;Iontophoresis 4mg /ml Dexamethasone;Moist Heat;Traction;DME Instruction;Neuromuscular re-education;Patient/family education;Manual techniques;Passive range of motion;Taping    PT Next Visit Plan  continue to progress ther-ex as tolerated; review lifting technique    PT Home Exercise Plan  LTR; hamstring stretch, tennis ball trigger point release ; abdominal breathing    Consulted and Agree with Plan of Care  Patient       Patient will benefit from skilled therapeutic intervention in order to improve the following deficits and impairments:  Abnormal gait, Difficulty walking, Decreased range of motion, Decreased mobility, Decreased strength, Postural dysfunction, Pain, Increased muscle spasms  Visit Diagnosis: Chronic bilateral low back pain without sciatica  Muscle weakness (generalized)  Cramp and spasm  Difficulty in walking, not elsewhere classified     Problem List Patient Active Problem List   Diagnosis Date Noted  . Cervical radiculopathy 10/18/2019  . Numbness 09/21/2019  . Bilateral carpal tunnel syndrome 09/21/2019  . High risk medication use 09/21/2019  . Memory loss 09/21/2019  . Essential hypertension 08/25/2018  . Abnormal SPEP 02/19/2018  . Hand pain 02/20/2017  . Multiple joint pain 02/18/2017  . Right elbow pain 12/09/2016  . Disturbed cognition 06/25/2016   . Trochanteric bursitis of left hip 02/18/2016  . Sciatica, right side 10/30/2015  . Bilateral arm pain 10/10/2015  . Trochanteric bursitis of right hip 08/04/2014  . Adjustment disorder with mixed anxiety and depressed mood 08/04/2014  . Chronic fatigue 08/04/2014  . Urinary frequency 08/04/2014  . Abnormality of gait 08/04/2014  . Unspecified visual disturbance 01/31/2013  . Transient vision disturbance 01/31/2013  . Routine general medical examination at a health care facility 11/04/2011  . Colon polyps 11/03/2011  . Hot flashes, menopausal 10/13/2011  . POSTHERPETIC NEURALGIA 10/03/2009  . DISPLCMT LUMBAR INTERVERT [place] W/O MYELOPATHY 09/05/2009  . RENAL CALCULUS, RECURRENT 09/04/2009  . Hyperlipidemia 08/31/2009  . MULTIPLE SCLEROSIS, PROGRESSIVE/RELAPSING 08/31/2009  . ALLERGIC RHINITIS 08/31/2009    Carney Living 10/27/2019, 8:58 PM  [place], [place], [address] Phone: [phone]   Fax:  [phone]  Name: Dawn [name] MRN: [id number] Date of Birth: 10-21-1945

## 2019-11-04 ENCOUNTER — Other Ambulatory Visit: Payer: Self-pay

## 2019-11-04 ENCOUNTER — Ambulatory Visit: Payer: Medicare Other | Admitting: Physical Therapy

## 2019-11-04 ENCOUNTER — Encounter: Payer: Self-pay | Admitting: Physical Therapy

## 2019-11-04 DIAGNOSIS — M6281 Muscle weakness (generalized): Secondary | ICD-10-CM | POA: Diagnosis not present

## 2019-11-04 DIAGNOSIS — M545 Low back pain, unspecified: Secondary | ICD-10-CM

## 2019-11-04 DIAGNOSIS — G8929 Other chronic pain: Secondary | ICD-10-CM

## 2019-11-04 DIAGNOSIS — R262 Difficulty in walking, not elsewhere classified: Secondary | ICD-10-CM | POA: Diagnosis not present

## 2019-11-04 DIAGNOSIS — R252 Cramp and spasm: Secondary | ICD-10-CM | POA: Diagnosis not present

## 2019-11-04 NOTE — Therapy (Signed)
[place], [place], [address] Phone: [phone]   Fax:  [phone]  Physical Therapy Treatment  Patient Details  Name: Dawn [name] MRN: [id number] Date of Birth: 02-Dec-1945 Referring Provider (PT): Dr Christell Constant   Encounter Date: 11/04/2019  PT End of Session - 11/04/19 1144    Visit Number  21    Number of Visits  24    Date for PT Re-Evaluation  11/17/19    Authorization Type  Medicare Kx Modifier on visit 13 proress not on 30th visit    PT Start Time  1015    PT Stop Time  1057    PT Time Calculation (min)  42 min    Activity Tolerance  Patient tolerated treatment well    Behavior During Therapy  [facility] for tasks assessed/performed       Past Medical History:  Diagnosis Date  . Allergy   . Essential hypertension 08/25/2018  . Hot flashes, menopausal 10/13/2011   Estradiol started   . Memory loss 09/21/2019  . Multiple sclerosis ([place])   . Neuropathy   . Osteoarthritis   . Vision abnormalities     Past Surgical History:  Procedure Laterality Date  . ABDOMINAL HYSTERECTOMY  2002   partial  . LUMBAR FUSION      There were no vitals filed for this visit.  Subjective Assessment - 11/04/19 1021    Subjective  Patient reports her hips are sore. She is also having some stiffness in her neck and pain in her hands.    Pertinent History  MS    How long can you stand comfortably?  hurts as soon as she stands. Has to shift frequently    How long can you walk comfortably?  limited community ambualtion    Diagnostic tests  Nothign recent    Currently in Pain?  Yes    Pain Score  5     Pain Location  Hip    Pain Orientation  Right    Pain Descriptors / Indicators  Aching    Pain Type  Chronic pain    Pain Radiating Towards  standing and walking    Pain Onset  More than a month ago    Pain Frequency  Constant    Aggravating Factors   standing and walking    Pain Relieving Factors  physcial  therapy    Effect of Pain on Daily Activities  difficulty walking distacnes    Multiple Pain Sites  Yes    Pain Score  4    Pain Location  Neck    Pain Orientation  Right;Left    Pain Descriptors / Indicators  Aching    Pain Type  Chronic pain    Pain Onset  More than a month ago    Pain Frequency  Constant                       [facility] Adult PT Treatment/Exercise - 11/04/19 0001      Lumbar Exercises: Stretches   Active Hamstring Stretch  3 reps;20 seconds    Lower Trunk Rotation Limitations  x20 with cuing for technique     Piriformis Stretch Limitations  2x20 sec holdleft only today      Lumbar Exercises: Supine   Other Supine Lumbar Exercises  clam shell yellow 3x10       Manual Therapy   Manual Therapy  Passive ROM    Manual therapy comments  skilled palpation of trigger points     Joint Mobilization  PA glides from L2 to L5     Soft tissue mobilization  to lumbar spine and upper gluteals bilateral ; trigger point release to bilateral upper traps     Passive ROM  focus on left hip hip flexionIR/ER              PT Education - 11/04/19 1144    Education Details  reviewed technique with bvalance exercises    Person(s) Educated  Patient    Methods  Explanation;Tactile cues;Verbal cues;Demonstration    Comprehension  Verbalized understanding;Returned demonstration;Verbal cues required;Tactile cues required       PT Short Term Goals - 10/06/19 1051      PT SHORT TERM GOAL #1   Title  Patient will report no pain radiating into anterior thigh but still pain in the hip    Baseline  intemrittent but improved from last week    Time  4    Period  Weeks    Status  On-going    Target Date  05/24/19      PT SHORT TERM GOAL #2   Title  Patient will increase bilateral LE strength to 4/5    Baseline  4/5 gross    Time  4    Period  Weeks    Status  On-going    Target Date  05/24/19      PT SHORT TERM GOAL #3   Title  Patient will increase passive right  hip flexion to 105 degrrees    Baseline  103 today without pain    Time  4    Period  Weeks    Status  On-going    Target Date  05/24/19        PT Long Term Goals - 11/04/19 1147      PT LONG TERM GOAL #1   Title  Patient will stand for 30 min without self reported increase pain in order to stand at a kitchen sink.    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 her pain in her hand went away. She will have an MRI at ythe end of the month. If her MRI shows cervical dysfunction she will get a new presscription.He rbalance is improving. Today she worked on balance on a balance mat and did very well.    Personal Factors and Comorbidities  Comorbidity 1;Comorbidity 2    Comorbidities  MS, OA  Examination-Activity Limitations  Locomotion Level;Carry;Stand;Lift;Stairs;Squat    Examination-Participation Restrictions  Meal Prep;Cleaning;Community Activity;Laundry;Shop    Stability/Clinical Decision Making  Evolving/Moderate complexity    Clinical Decision Making  Moderate    Rehab Potential  Good    PT Frequency  2x / week    PT Duration  8 weeks    PT Treatment/Interventions  ADLs/Self Care Home Management;Electrical Stimulation;Cryotherapy;Iontophoresis 4mg /ml Dexamethasone;Moist Heat;Traction;DME Instruction;Neuromuscular re-education;Patient/family  education;Manual techniques;Passive range of motion;Taping    PT Next Visit Plan  continue to progress ther-ex as tolerated; review lifting technique    PT Home Exercise Plan  LTR; hamstring stretch, tennis ball trigger point release ; abdominal breathing    Consulted and Agree with Plan of Care  Patient       Patient will benefit from skilled therapeutic intervention in order to improve the following deficits and impairments:  Abnormal gait, Difficulty walking, Decreased range of motion, Decreased mobility, Decreased strength, Postural dysfunction, Pain, Increased muscle spasms  Visit Diagnosis: Chronic bilateral low back pain without sciatica  Muscle weakness (generalized)  Cramp and spasm  Difficulty in walking, not elsewhere classified     Problem List Patient Active Problem List   Diagnosis Date Noted  . Cervical radiculopathy 10/18/2019  . Numbness 09/21/2019  . Bilateral carpal tunnel syndrome 09/21/2019  . High risk medication use 09/21/2019  . Memory loss 09/21/2019  . Essential hypertension 08/25/2018  . Abnormal SPEP 02/19/2018  . Hand pain 02/20/2017  . Multiple joint pain 02/18/2017  . Right elbow pain 12/09/2016  . Disturbed cognition 06/25/2016  . Trochanteric bursitis of left hip 02/18/2016  . Sciatica, right side 10/30/2015  . Bilateral arm pain 10/10/2015  . Trochanteric bursitis of right hip 08/04/2014  . Adjustment disorder with mixed anxiety and depressed mood 08/04/2014  . Chronic fatigue 08/04/2014  . Urinary frequency 08/04/2014  . Abnormality of gait 08/04/2014  . Unspecified visual disturbance 01/31/2013  . Transient vision disturbance 01/31/2013  . Routine general medical examination at a health care facility 11/04/2011  . Colon polyps 11/03/2011  . Hot flashes, menopausal 10/13/2011  . POSTHERPETIC NEURALGIA 10/03/2009  . DISPLCMT LUMBAR INTERVERT [place] W/O MYELOPATHY 09/05/2009  . RENAL CALCULUS, RECURRENT 09/04/2009  . Hyperlipidemia  08/31/2009  . MULTIPLE SCLEROSIS, PROGRESSIVE/RELAPSING 08/31/2009  . ALLERGIC RHINITIS 08/31/2009    Carney Living  PT DPT  11/04/2019, 11:49 AM  [facility] [address] [place], [place], [address] Phone: [phone]   Fax:  [phone]  Name: DARBY ANGELLE MRN: [id number] Date of Birth: 1946/03/27

## 2019-11-11 ENCOUNTER — Encounter: Payer: Self-pay | Admitting: Physical Therapy

## 2019-11-11 ENCOUNTER — Ambulatory Visit: Payer: Medicare Other | Admitting: Physical Therapy

## 2019-11-11 ENCOUNTER — Other Ambulatory Visit: Payer: Self-pay

## 2019-11-11 DIAGNOSIS — M545 Low back pain, unspecified: Secondary | ICD-10-CM

## 2019-11-11 DIAGNOSIS — R262 Difficulty in walking, not elsewhere classified: Secondary | ICD-10-CM

## 2019-11-11 DIAGNOSIS — M6281 Muscle weakness (generalized): Secondary | ICD-10-CM | POA: Diagnosis not present

## 2019-11-11 DIAGNOSIS — R252 Cramp and spasm: Secondary | ICD-10-CM | POA: Diagnosis not present

## 2019-11-11 DIAGNOSIS — G8929 Other chronic pain: Secondary | ICD-10-CM | POA: Diagnosis not present

## 2019-11-11 NOTE — Therapy (Signed)
[place], [place], [address] Phone: [phone]   Fax:  [phone]  Physical Therapy Treatment  Patient Details  Name: Dawn [name] MRN: [id number] Date of Birth: [birth year] Referring Provider (PT): Dr Christell Constant   Encounter Date: 11/11/2019  PT End of Session - 11/11/19 1023    Visit Number  22    Number of Visits  24    Date for PT Re-Evaluation  11/17/19    Authorization Type  Medicare Kx Modifier on visit 13 proress not on 30th visit    PT Start Time  1015    PT Stop Time  1100    PT Time Calculation (min)  45 min       Past Medical History:  Diagnosis Date  . Allergy   . Essential hypertension 08/25/2018  . Hot flashes, menopausal 10/13/2011   Estradiol started   . Memory loss 09/21/2019  . Multiple sclerosis ([place])   . Neuropathy   . Osteoarthritis   . Vision abnormalities     Past Surgical History:  Procedure Laterality Date  . ABDOMINAL HYSTERECTOMY  2002   partial  . LUMBAR FUSION      There were no vitals filed for this visit.  Subjective Assessment - 11/11/19 1021    Subjective  Patient reports her hips are a bit flaired up today. She thinks it may be the oncoming rain.  The arm hasn't felt as bad as it was last week.    Pertinent History  MS    How long can you stand comfortably?  hurts as soon as she stands. Has to shift frequently    How long can you walk comfortably?  limited community ambualtion    Diagnostic tests  Nothign recent    Currently in Pain?  Yes    Pain Score  5     Pain Location  Hip    Pain Orientation  Right    Pain Descriptors / Indicators  Aching    Pain Type  Chronic pain    Pain Onset  More than a month ago    Pain Frequency  Constant    Aggravating Factors   standing and walking    Pain Relieving Factors  rest and stretching/exercises    Effect of Pain on Daily Activities  difficulty    Pain Score  4    Pain Location  Neck    Pain  Orientation  Right;Left    Pain Descriptors / Indicators  Aching    Pain Type  Chronic pain    Pain Radiating Towards  turening her head ; using aher arm    Pain Onset  More than a month ago    Pain Frequency  Constant    Aggravating Factors   none                       [facility] Adult PT Treatment/Exercise - 11/11/19 0001      Lumbar Exercises: Stretches   Active Hamstring Stretch  3 reps;20 seconds    Lower Trunk Rotation Limitations  x20 with cuing for technique     Piriformis Stretch Limitations  2x20 sec holdleft only today      Lumbar Exercises: Seated   Other Seated Lumbar Exercises  seated bilateral er yellow 2x10; horizontal  abduction 2x10       Manual Therapy   Manual Therapy  Passive ROM    Manual therapy comments  skilled palpation  of trigger points     Joint Mobilization  PA glides from L2 to L5     Soft tissue mobilization  to lumbar spine and upper gluteals bilateral ; trigger point release to bilateral upper traps     Passive ROM  focus on left hip hip flexionIR/ER              PT Education - 11/11/19 1023    Education Details  HEP and symptom mangement    Person(s) Educated  Patient    Methods  Explanation;Demonstration;Tactile cues;Verbal cues    Comprehension  Verbalized understanding;Returned demonstration;Verbal cues required;Tactile cues required       PT Short Term Goals - 10/06/19 1051      PT SHORT TERM GOAL #1   Title  Patient will report no pain radiating into anterior thigh but still pain in the hip    Baseline  intemrittent but improved from last week    Time  4    Period  Weeks    Status  On-going    Target Date  05/24/19      PT SHORT TERM GOAL #2   Title  Patient will increase bilateral LE strength to 4/5    Baseline  4/5 gross    Time  4    Period  Weeks    Status  On-going    Target Date  05/24/19      PT SHORT TERM GOAL #3   Title  Patient will increase passive right hip flexion to 105 degrrees    Baseline   103 today without pain    Time  4    Period  Weeks    Status  On-going    Target Date  05/24/19        PT Long Term Goals - 11/04/19 1147      PT LONG TERM GOAL #1   Title  Patient will stand for 30 min without self reported increase pain in order to stand at a kitchen sink.    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 She had increased pain today so therapy focused more on manual therapy. Therapy did review light hip strengthening and postural correction. Therapy will progrewss her back into balance exercises next week.    Personal Factors and Comorbidities  Comorbidity 1;Comorbidity 2    Comorbidities  MS, OA    Examination-Activity Limitations  Locomotion Level;Carry;Stand;Lift;Stairs;Squat    Stability/Clinical Decision Making  Evolving/Moderate complexity    Clinical Decision  Making  Moderate    PT Frequency  2x / week    PT Duration  8 weeks    PT Treatment/Interventions  ADLs/Self Care Home Management;Electrical Stimulation;Cryotherapy;Iontophoresis 4mg /ml Dexamethasone;Moist Heat;Traction;DME Instruction;Neuromuscular re-education;Patient/family education;Manual techniques;Passive range of motion;Taping    PT Next Visit Plan  continue to progress ther-ex as tolerated; review lifting technique    PT Home Exercise Plan  LTR; hamstring  stretch, tennis ball trigger point release ; abdominal breathing    Consulted and Agree with Plan of Care  Patient       Patient will benefit from skilled therapeutic intervention in order to improve the following deficits and impairments:  Abnormal gait, Difficulty walking, Decreased range of motion, Decreased mobility, Decreased strength, Postural dysfunction, Pain, Increased muscle spasms  Visit Diagnosis: Chronic bilateral low back pain without sciatica  Muscle weakness (generalized)  Cramp and spasm  Difficulty in walking, not elsewhere classified     Problem List Patient Active Problem List   Diagnosis Date Noted  . Cervical radiculopathy 10/18/2019  . Numbness 09/21/2019  . Bilateral carpal tunnel syndrome 09/21/2019  . High risk medication use 09/21/2019  . Memory loss 09/21/2019  . Essential hypertension 08/25/2018  . Abnormal SPEP 02/19/2018  . Hand pain 02/20/2017  . Multiple joint pain 02/18/2017  . Right elbow pain 12/09/2016  . Disturbed cognition 06/25/2016  . Trochanteric bursitis of left hip 02/18/2016  . Sciatica, right side 10/30/2015  . Bilateral arm pain 10/10/2015  . Trochanteric bursitis of right hip 08/04/2014  . Adjustment disorder with mixed anxiety and depressed mood 08/04/2014  . Chronic fatigue 08/04/2014  . Urinary frequency 08/04/2014  . Abnormality of gait 08/04/2014  . Unspecified visual disturbance 01/31/2013  . Transient vision disturbance 01/31/2013  . Routine general medical examination at a health care facility 11/04/2011  . Colon polyps 11/03/2011  . Hot flashes, menopausal 10/13/2011  . POSTHERPETIC NEURALGIA 10/03/2009  . DISPLCMT LUMBAR INTERVERT [place] W/O MYELOPATHY 09/05/2009  . RENAL CALCULUS, RECURRENT 09/04/2009  . Hyperlipidemia 08/31/2009  . MULTIPLE SCLEROSIS, PROGRESSIVE/RELAPSING 08/31/2009  . ALLERGIC RHINITIS 08/31/2009    Carney Living PT DPT  11/11/2019, 11:39 AM  [facility] [address] [place], [place], [address] Phone: [phone]   Fax:  [phone]  Name: ESHIKA RIGALI MRN: [id number] Date of Birth: [birth year]

## 2019-11-14 DIAGNOSIS — H35362 Drusen (degenerative) of macula, left eye: Secondary | ICD-10-CM | POA: Diagnosis not present

## 2019-11-14 DIAGNOSIS — H35013 Changes in retinal vascular appearance, bilateral: Secondary | ICD-10-CM | POA: Diagnosis not present

## 2019-11-14 DIAGNOSIS — H35033 Hypertensive retinopathy, bilateral: Secondary | ICD-10-CM | POA: Diagnosis not present

## 2019-11-14 DIAGNOSIS — H26491 Other secondary cataract, right eye: Secondary | ICD-10-CM | POA: Diagnosis not present

## 2019-11-14 DIAGNOSIS — H26493 Other secondary cataract, bilateral: Secondary | ICD-10-CM | POA: Diagnosis not present

## 2019-11-17 ENCOUNTER — Ambulatory Visit
Admission: RE | Admit: 2019-11-17 | Discharge: 2019-11-17 | Disposition: A | Discharge status: Home / Self Care | Payer: Medicare Other | Source: Ambulatory Visit | Attending: Neurology | Admitting: Neurology

## 2019-11-17 ENCOUNTER — Other Ambulatory Visit: Payer: Self-pay

## 2019-11-17 DIAGNOSIS — G35 Multiple sclerosis: Secondary | ICD-10-CM

## 2019-11-18 ENCOUNTER — Ambulatory Visit: Payer: Medicare Other | Admitting: Physical Therapy

## 2019-11-18 DIAGNOSIS — G8929 Other chronic pain: Secondary | ICD-10-CM | POA: Diagnosis not present

## 2019-11-18 DIAGNOSIS — R252 Cramp and spasm: Secondary | ICD-10-CM | POA: Diagnosis not present

## 2019-11-18 DIAGNOSIS — M6281 Muscle weakness (generalized): Secondary | ICD-10-CM

## 2019-11-18 DIAGNOSIS — R262 Difficulty in walking, not elsewhere classified: Secondary | ICD-10-CM

## 2019-11-18 DIAGNOSIS — M545 Low back pain, unspecified: Secondary | ICD-10-CM

## 2019-11-18 NOTE — Therapy (Signed)
[place], [place], [address] Phone: [phone]   Fax:  [phone]  Physical Therapy Treatment/ Recert   Patient Details  Name: Dawn [name] MRN: [id number] Date of Birth: 1945-11-13 Referring Provider (PT): Dr Christell Constant   Encounter Date: 11/18/2019  PT End of Session - 11/18/19 1106    Visit Number  23    Number of Visits  29    Date for PT Re-Evaluation  12/30/19    Authorization Type  Medicare Kx Modifier on visit 13 proress not on 30th visit    PT Start Time  1015    PT Stop Time  1056    PT Time Calculation (min)  41 min    Activity Tolerance  Patient tolerated treatment well    Behavior During Therapy  [facility] for tasks assessed/performed       Past Medical History:  Diagnosis Date  . Allergy   . Essential hypertension 08/25/2018  . Hot flashes, menopausal 10/13/2011   Estradiol started   . Memory loss 09/21/2019  . Multiple sclerosis ([place])   . Neuropathy   . Osteoarthritis   . Vision abnormalities     Past Surgical History:  Procedure Laterality Date  . ABDOMINAL HYSTERECTOMY  2002   partial  . LUMBAR FUSION      There were no vitals filed for this visit.  Subjective Assessment - 11/18/19 1104    Subjective  Patient reports he has been having abit of a flair up. She is having bertigo and her hip and back are hurting worse. She has been perfroming stretches and light exercises which have helped.    Pertinent History  MS    How long can you stand comfortably?  hurts as soon as she stands. Has to shift frequently    Currently in Pain?  Yes    Pain Score  6     Pain Location  Hip    Pain Orientation  Right    Pain Descriptors / Indicators  Aching    Pain Type  Chronic pain    Pain Radiating Towards  standing and walking    Pain Onset  More than a month ago    Pain Frequency  Constant    Aggravating Factors   standing and walking    Pain Relieving Factors  rest and stretching     Effect of Pain on Daily Activities  difficuly perfroming aily tasks         [facility] PT Assessment - 11/18/19 0001      AROM   Lumbar Flexion  70    Lumbar Extension  20    Lumbar - Right Side Bend  --   painful today but overall improved    Lumbar - Right Rotation  --   overall improved but still painful    Lumbar - Left Rotation  --   overall painful but improved      Strength   Right Hip Flexion  4+/5    Right Hip ABduction  4+/5    Right Hip ADduction  4+/5    Left Hip Flexion  4+/5    Left Hip ABduction  4+/5    Left Hip ADduction  4+/5    Right Knee Flexion  4+/5    Right Knee Extension  4+/5    Left Knee Flexion  4+/5    Left Knee Extension  4+/5      Palpation   Palpation comment  increasedtenderness to  palpation today but overall improved       High Level Balance   High Level Balance Comments  tandem stance :Min gaurd improved from min a on intial assessment; narrow base eyes closed with min guard improved from min a upon initial assessement      High Level Balance   High Level Balance Comments  unable to maintain single leg stance;                    [facility] Adult PT Treatment/Exercise - 11/18/19 0001      Lumbar Exercises: Standing   Other Standing Lumbar Exercises  standing march 2x10; standing heel raise       Lumbar Exercises: Seated   Other Seated Lumbar Exercises  seated bilateral er yellow 2x10; horizontal  abduction 2x10              PT Education - 11/18/19 1106    Education Details  symptom mangamenet during flair ups    Person(s) Educated  Patient    Methods  Explanation    Comprehension  Verbalized understanding;Returned demonstration;Verbal cues required;Tactile cues required       PT Short Term Goals - 11/18/19 1117      PT SHORT TERM GOAL #1   Title  Patient will report no pain radiating into anterior thigh but still pain in the hip    Baseline  improved overal radicular pian but has a current exacerbation    Time   4    Period  Weeks    Status  On-going    Target Date  12/09/19      PT SHORT TERM GOAL #2   Title  Patient will increase bilateral LE strength to 4/5    Baseline  4+/5 gross    Time  4    Period  Weeks    Status  Achieved      PT SHORT TERM GOAL #3   Title  Patient will increase passive right hip flexion to 105 degrrees    Baseline  100 today with pain    Time  4    Period  Weeks    Status  On-going        PT Long Term Goals - 11/18/19 1118      PT LONG TERM GOAL #1   Title  Patient will stand for 30 min without self reported increase pain in order to stand at a kitchen sink.    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 therapy has progressedher toward higher level balance activity and strengthening. Today the treatment session focsed more on manual therapy and symptom mangement 2nd to a flair up of pain in her hip and balck and increased vertigo. She would benefit from further skilled therapy 1W^ to continue to work on bilateral hip strengthening, balance training, and fucntional mobility training. She has increased pain in her neck which has improved slightly with postural exercises. If MRI reveals that there is musculoskeletal involvement we can re-assess her neck and add it to the  treatment.    Personal Factors and Comorbidities  Comorbidity 1;Comorbidity 2    Comorbidities  MS, OA    Examination-Activity Limitations  Locomotion Level;Carry;Stand;Lift;Stairs;Squat    Stability/Clinical Decision Making  Evolving/Moderate complexity    Clinical Decision Making  Moderate    Rehab Potential  Good    PT Frequency  1x / week    PT Duration  6 weeks    PT Treatment/Interventions  ADLs/Self Care Home Management;Electrical Stimulation;Cryotherapy;Iontophoresis 4mg /ml Dexamethasone;Moist Heat;Traction;DME Instruction;Neuromuscular re-education;Patient/family education;Manual techniques;Passive range of motion;Taping    PT Next Visit Plan  continue to progress ther-ex as tolerated; review lifting technique    PT Home Exercise Plan  LTR; hamstring stretch, tennis ball trigger point release ; abdominal breathing    Consulted and Agree with Plan of Care  Patient       Patient will benefit from skilled therapeutic intervention in order to improve the following deficits and impairments:  Abnormal gait, Difficulty walking, Decreased range of motion, Decreased mobility, Decreased strength, Postural dysfunction, Pain, Increased muscle spasms  Visit Diagnosis: Chronic bilateral low back pain without sciatica - Plan: PT plan of care cert/re-cert  Muscle weakness (generalized) - Plan: PT plan of care cert/re-cert  Cramp and spasm - Plan: PT plan of care cert/re-cert  Difficulty in walking, not elsewhere classified - Plan: PT plan of care cert/re-cert     Problem List Patient Active Problem List   Diagnosis Date Noted  . Cervical radiculopathy 10/18/2019  . Numbness 09/21/2019  . Bilateral carpal tunnel syndrome 09/21/2019  . High risk medication use 09/21/2019  . Memory loss 09/21/2019  . Essential hypertension 08/25/2018  . Abnormal SPEP 02/19/2018  . Hand pain 02/20/2017  . Multiple joint pain 02/18/2017  . Right elbow pain 12/09/2016  . Disturbed cognition 06/25/2016   . Trochanteric bursitis of left hip 02/18/2016  . Sciatica, right side 10/30/2015  . Bilateral arm pain 10/10/2015  . Trochanteric bursitis of right hip 08/04/2014  . Adjustment disorder with mixed anxiety and depressed mood 08/04/2014  . Chronic fatigue 08/04/2014  . Urinary frequency 08/04/2014  . Abnormality of gait 08/04/2014  . Unspecified visual disturbance 01/31/2013  . Transient vision disturbance 01/31/2013  . Routine general medical examination at a health care facility 11/04/2011  . Colon polyps 11/03/2011  . Hot flashes, menopausal 10/13/2011  . POSTHERPETIC NEURALGIA 10/03/2009  . DISPLCMT LUMBAR INTERVERT [place] W/O MYELOPATHY 09/05/2009  . RENAL CALCULUS, RECURRENT 09/04/2009  . Hyperlipidemia 08/31/2009  . MULTIPLE SCLEROSIS, PROGRESSIVE/RELAPSING 08/31/2009  . ALLERGIC RHINITIS 08/31/2009    Carney Living 11/18/2019, 11:25 AM  [facility] [address] [place], [place], [address] Phone: [phone]   Fax:  [phone]  Name: Dawn [name] MRN: [id number] Date of Birth: [birth year]

## 2019-11-28 ENCOUNTER — Ambulatory Visit: Payer: Medicare Other | Admitting: Physical Therapy

## 2019-11-28 DIAGNOSIS — H26492 Other secondary cataract, left eye: Secondary | ICD-10-CM | POA: Diagnosis not present

## 2019-12-05 ENCOUNTER — Other Ambulatory Visit: Payer: Self-pay

## 2019-12-05 ENCOUNTER — Encounter: Payer: Self-pay | Admitting: Physical Therapy

## 2019-12-05 ENCOUNTER — Ambulatory Visit: Payer: Medicare Other | Attending: Neurology | Admitting: Physical Therapy

## 2019-12-05 DIAGNOSIS — R252 Cramp and spasm: Secondary | ICD-10-CM | POA: Insufficient documentation

## 2019-12-05 DIAGNOSIS — R262 Difficulty in walking, not elsewhere classified: Secondary | ICD-10-CM | POA: Diagnosis not present

## 2019-12-05 DIAGNOSIS — M545 Low back pain: Secondary | ICD-10-CM | POA: Diagnosis not present

## 2019-12-05 DIAGNOSIS — M6281 Muscle weakness (generalized): Secondary | ICD-10-CM | POA: Diagnosis not present

## 2019-12-05 DIAGNOSIS — G8929 Other chronic pain: Secondary | ICD-10-CM | POA: Insufficient documentation

## 2019-12-05 NOTE — Therapy (Signed)
[place], [place], [address] Phone: [phone]   Fax:  [phone]  Physical Therapy Treatment  Patient Details  Name: Dawn [name] MRN: [id number] Date of Birth: 03-03-1946 Referring Provider (PT): Dr Christell Constant   Encounter Date: 12/05/2019  PT End of Session - 12/05/19 1145    Visit Number  24    Number of Visits  29    Date for PT Re-Evaluation  12/30/19    Authorization Type  Medicare Kx Modifier on visit 13 proress not on 30th visit    PT Start Time  1015    PT Stop Time  1057    PT Time Calculation (min)  42 min    Activity Tolerance  Patient tolerated treatment well    Behavior During Therapy  [facility] for tasks assessed/performed       Past Medical History:  Diagnosis Date  . Allergy   . Essential hypertension 08/25/2018  . Hot flashes, menopausal 10/13/2011   Estradiol started   . Memory loss 09/21/2019  . Multiple sclerosis ([place])   . Neuropathy   . Osteoarthritis   . Vision abnormalities     Past Surgical History:  Procedure Laterality Date  . ABDOMINAL HYSTERECTOMY  2002   partial  . LUMBAR FUSION      There were no vitals filed for this visit.  Subjective Assessment - 12/05/19 1141    Subjective  Patients right hip and back have been hurting her for a few weeks. She is having more pain up into the right side of her back. She also continues to have neck and shoulder pain.    Pertinent History  MS    How long can you stand comfortably?  hurts as soon as she stands. Has to shift frequently    How long can you walk comfortably?  limited community ambualtion    Currently in Pain?  Yes    Pain Score  6     Pain Location  Hip    Pain Orientation  Right    Pain Descriptors / Indicators  Aching    Pain Type  Chronic pain    Pain Onset  More than a month ago    Pain Frequency  Constant    Aggravating Factors   standing and walking    Pain Relieving Factors  rest/ stretching    Effect of Pain on Daily Activities  difficulty walking distances    Multiple Pain Sites  Yes    Pain Score  6    Pain Location  Back    Pain Orientation  Right    Pain Descriptors / Indicators  Aching    Pain Type  Chronic pain    Pain Onset  More than a month ago    Pain Frequency  Constant                        [facility] Adult PT Treatment/Exercise - 12/05/19 0001      Manual Therapy   Manual Therapy  Passive ROM    Manual therapy comments  skilled palpation of trigger points     Joint Mobilization  PA glides from L2 to L5     Soft tissue mobilization  to lumbar spine and upper gluteals bilateral ; trigger point release to bilateral upper traps     Passive ROM  focus on left hip hip flexionIR/ER        Trigger Point Dry Needling -  12/05/19 0001    Consent Given?  Yes    Dry Needling Comments  3 needles in gluteals 3 in right lumbar paraspinals 30x75 needle     Gluteus Medius Response  Twitch response elicited    Lumbar multifidi Response  Twitch response elicited;Palpable increased muscle length           PT Education - 12/05/19 1144    Education Details  reviewed HEP and symptom mangement/ reviewed ther- to perfrom at home    Person(s) Educated  Patient    Methods  Explanation;Demonstration;Tactile cues;Verbal cues    Comprehension  Verbalized understanding;Returned demonstration;Verbal cues required;Tactile cues required       PT Short Term Goals - 12/05/19 1149      PT SHORT TERM GOAL #1   Title  Patient will report no pain radiating into anterior thigh but still pain in the hip    Baseline  improved overal radicular pian but has a current exacerbation    Time  4    Period  Weeks    Status  On-going    Target Date  12/09/19      PT SHORT TERM GOAL #2   Title  Patient will increase bilateral LE strength to 4/5    Baseline  4+/5 gross    Time  4    Period  Weeks    Status  Achieved    Target Date  05/24/19      PT SHORT TERM GOAL #3    Title  Patient will increase passive right hip flexion to 105 degrrees    Baseline  100 today with pain    Time  4    Period  Weeks    Status  On-going    Target Date  05/24/19        PT Long Term Goals - 11/18/19 1118      PT LONG TERM GOAL #1   Title  Patient will stand for 30 min without self reported increase pain in order to stand at a kitchen sink.    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 She had a signifcant inmprovement in pain following treatment. She was advised to continbue to work on hexercises at home. therapy will hopefully advance patient back to balance exercises. She has been having some pain in her neck. She was advised.    Personal Factors  and Comorbidities  Comorbidity 1;Comorbidity 2    Comorbidities  MS, OA    Examination-Activity Limitations  Locomotion Level;Carry;Stand;Lift;Stairs;Squat    Examination-Participation Restrictions  Meal Prep;Cleaning;Community Activity;Laundry;Shop    Stability/Clinical Decision Making  Evolving/Moderate complexity    Clinical Decision Making  Moderate    Rehab Potential  Good    PT Frequency  1x / week    PT Duration  6 weeks    PT Treatment/Interventions  ADLs/Self Care Home Management;Electrical  Stimulation;Cryotherapy;Iontophoresis 4mg /ml Dexamethasone;Moist Heat;Traction;DME Instruction;Neuromuscular re-education;Patient/family education;Manual techniques;Passive range of motion;Taping    PT Next Visit Plan  continue to progress ther-ex as tolerated; review lifting technique    PT Home Exercise Plan  LTR; hamstring stretch, tennis ball trigger point release ; abdominal breathing       Patient will benefit from skilled therapeutic intervention in order to improve the following deficits and impairments:  Abnormal gait, Difficulty walking, Decreased range of motion, Decreased mobility, Decreased strength, Postural dysfunction, Pain, Increased muscle spasms  Visit Diagnosis: Chronic bilateral low back pain without sciatica  Muscle weakness (generalized)  Cramp and spasm  Difficulty in walking, not elsewhere classified     Problem List Patient Active Problem List   Diagnosis Date Noted  . Cervical radiculopathy 10/18/2019  . Numbness 09/21/2019  . Bilateral carpal tunnel syndrome 09/21/2019  . High risk medication use 09/21/2019  . Memory loss 09/21/2019  . Essential hypertension 08/25/2018  . Abnormal SPEP 02/19/2018  . Hand pain 02/20/2017  . Multiple joint pain 02/18/2017  . Right elbow pain 12/09/2016  . Disturbed cognition 06/25/2016  . Trochanteric bursitis of left hip 02/18/2016  . Sciatica, right side 10/30/2015  . Bilateral arm pain 10/10/2015  . Trochanteric bursitis of right hip 08/04/2014  . Adjustment disorder with mixed anxiety and depressed mood 08/04/2014  . Chronic fatigue 08/04/2014  . Urinary frequency 08/04/2014  . Abnormality of gait 08/04/2014  . Unspecified visual disturbance 01/31/2013  . Transient vision disturbance 01/31/2013  . Routine general medical examination at a health care facility 11/04/2011  . Colon polyps 11/03/2011  . Hot flashes, menopausal 10/13/2011  . POSTHERPETIC NEURALGIA 10/03/2009  . DISPLCMT LUMBAR INTERVERT [place] W/O  MYELOPATHY 09/05/2009  . RENAL CALCULUS, RECURRENT 09/04/2009  . Hyperlipidemia 08/31/2009  . MULTIPLE SCLEROSIS, PROGRESSIVE/RELAPSING 08/31/2009  . ALLERGIC RHINITIS 08/31/2009    Carney Living  PT DPT  12/05/2019, 12:48 PM  [facility] [address] [place], [place], [address] Phone: [phone]   Fax:  [phone]  Name: CHRISTIANN SINDLINGER MRN: [id number] Date of Birth: 1946-03-04

## 2019-12-08 ENCOUNTER — Telehealth: Payer: Self-pay | Admitting: Neurology

## 2019-12-08 DIAGNOSIS — G35 Multiple sclerosis: Secondary | ICD-10-CM

## 2019-12-08 NOTE — Telephone Encounter (Signed)
Called, LVM returning pt call 

## 2019-12-08 NOTE — Telephone Encounter (Signed)
Took call from phone staff and spoke with pt. She would like PT extended. Advised we will place new referral, MD ok with this. She verbalized understanding.

## 2019-12-08 NOTE — Telephone Encounter (Signed)
Pt is requesting a continuation of her physical therapy, pt is asking for a call to discuss.

## 2019-12-08 NOTE — Addendum Note (Signed)
Addended by: Wyvonnia Lora on: 12/08/2019 10:03 AM   Modules accepted: Orders

## 2019-12-12 ENCOUNTER — Encounter: Payer: Self-pay | Admitting: Physical Therapy

## 2019-12-12 ENCOUNTER — Ambulatory Visit: Payer: Medicare Other | Admitting: Physical Therapy

## 2019-12-12 ENCOUNTER — Other Ambulatory Visit: Payer: Self-pay

## 2019-12-12 DIAGNOSIS — R262 Difficulty in walking, not elsewhere classified: Secondary | ICD-10-CM | POA: Diagnosis not present

## 2019-12-12 DIAGNOSIS — G8929 Other chronic pain: Secondary | ICD-10-CM

## 2019-12-12 DIAGNOSIS — M6281 Muscle weakness (generalized): Secondary | ICD-10-CM | POA: Diagnosis not present

## 2019-12-12 DIAGNOSIS — M545 Low back pain: Secondary | ICD-10-CM | POA: Diagnosis not present

## 2019-12-12 DIAGNOSIS — R252 Cramp and spasm: Secondary | ICD-10-CM | POA: Diagnosis not present

## 2019-12-12 NOTE — Therapy (Signed)
[place], [place], [address] Phone: [phone]   Fax:  [phone]  Physical Therapy Treatment  Patient Details  Name: Dawn [name] MRN: [id number] Date of Birth: October 09, 1945 Referring Provider (PT): Dr Christell Constant   Encounter Date: 12/12/2019  PT End of Session - 12/12/19 1047    Visit Number  25    Number of Visits  31    Date for PT Re-Evaluation  01/23/20    Authorization Type  Medicare Kx Modifier on visit 13 proress not on 30th visit    PT Start Time  1015    PT Stop Time  1057    PT Time Calculation (min)  42 min    Activity Tolerance  Patient tolerated treatment well    Behavior During Therapy  [facility] for tasks assessed/performed       Past Medical History:  Diagnosis Date  . Allergy   . Essential hypertension 08/25/2018  . Hot flashes, menopausal 10/13/2011   Estradiol started   . Memory loss 09/21/2019  . Multiple sclerosis ([place])   . Neuropathy   . Osteoarthritis   . Vision abnormalities     Past Surgical History:  Procedure Laterality Date  . ABDOMINAL HYSTERECTOMY  2002   partial  . LUMBAR FUSION      There were no vitals filed for this visit.  Subjective Assessment - 12/12/19 1019    Subjective  Patient reports her low back pain has been better but her hips have hurt. She feels like her neck and hands are a little better but her neclk and hands are still flairing up from time to time. We recivied a new presciption. We will re-assess her cervical spine today.    How long can you stand comfortably?  hurts as soon as she stands. Has to shift frequently    How long can you walk comfortably?  limited community ambualtion    Diagnostic tests  Nothign recent    Currently in Pain?  Yes    Pain Score  6     Pain Location  Hip    Pain Orientation  Right;Left    Pain Descriptors / Indicators  Aching    Pain Type  Chronic pain    Pain Onset  More than a month ago    Pain Frequency   Constant    Aggravating Factors   standing and walking    Pain Relieving Factors  rest and stretching    Effect of Pain on Daily Activities  difficulty walking distances    Multiple Pain Sites  --   Neck has been stiff but not painful today.   Pain Onset  More than a month ago    Pain Frequency  Constant         [facility] PT Assessment - 12/12/19 0001      AROM   AROM Assessment Site  Cervical    Cervical Flexion  20    Cervical Extension  18    Cervical - Right Rotation  50    Cervical - Left Rotation  70      Strength   Strength Assessment Site  Shoulder    Right/Left Shoulder  Right;Left    Right Shoulder Flexion  3+/5    Right Shoulder Internal Rotation  3+/5    Right Shoulder External Rotation  3+/5    Left Shoulder Flexion  3+/5    Left Shoulder External Rotation  3+/5    Left Shoulder  Horizontal ABduction  3+/5      Ambulation/Gait   Gait Comments  decreased bilaterl hip flexion; decreased single leg stance time on the right                    [facility] Adult PT Treatment/Exercise - 12/12/19 0001      Self-Care   Other Self-Care Comments   reviewed benefits of trigger point release and POC for postural correction       Lumbar Exercises: Stretches   Lower Trunk Rotation Limitations  x20 with cuing for technique     Piriformis Stretch  3 reps;20 seconds    Piriformis Stretch Limitations  2x20 sec holdleft only today      Manual Therapy   Manual Therapy  Passive ROM;Manual Traction    Manual therapy comments  skilled palpation of trigger points     Joint Mobilization  PA glides from L2 to L5     Soft tissue mobilization  to lumbar spine and upper gluteals bilateral ; trigger point release to bilateral upper traps     Passive ROM  focus on left hip hip flexionIR/ER     Manual Traction  gentle manual traction to cervical spine.              PT Education - 12/12/19 1022    Education Details  HEP and symptom mangement    Person(s) Educated  Patient     Methods  Explanation;Demonstration;Tactile cues;Verbal cues    Comprehension  Verbalized understanding;Returned demonstration;Verbal cues required;Tactile cues required       PT Short Term Goals - 12/12/19 1333      PT SHORT TERM GOAL #1   Title  Patient will report no pain radiating into anterior thigh    Baseline  ino radiucalr pain at this time    Time  4    Period  Weeks    Status  Achieved      PT SHORT TERM GOAL #2   Title  Patient will increase bilateral LE strength to 4/5    Baseline  4+/5 gross    Time  4    Period  Weeks    Status  Achieved    Target Date  05/24/19      PT SHORT TERM GOAL #3   Title  Patient will increase passive right hip flexion to 105 degrrees    Baseline  100 today with pain    Time  4    Period  Weeks    Status  On-going    Target Date  05/24/19      PT SHORT TERM GOAL #4   Title  Patient will increase left cervical rotation by 10 degrees    Time  4    Period  Weeks    Status  New    Target Date  01/09/20      PT SHORT TERM GOAL #5   Title  Patient will increase ross bilateral UE strength to 4/5    Time  4    Period  Weeks    Status  New    Target Date  01/09/20        PT Long Term Goals - 12/12/19 1351      PT LONG TERM GOAL #1   Title  Patient will stand for 30 min without self reported increase pain in order to stand at a kitchen sink.    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     Baseline  continues to have pain    Time  6    Period  Weeks    Status  On-going    Target Date  01/23/20      PT  LONG TERM GOAL #4   Title  Patient will reach behind his head without pain in order to perfrom ADL's    Time  6    Period  Weeks    Status  New    Target Date  01/23/20      PT LONG TERM GOAL #5   Title  Patient will report no radicular pain into her hands    Time  6    Period  Weeks    Status  New    Target Date  01/23/20      Additional Long Term Goals   Additional Long Term Goals  Yes      PT LONG TERM GOAL #6   Title  POatient will improve ABC score to 50% function in order to decrease her risk of fall    Time  6    Period  Weeks    Status  New    Target Date  01/23/20            Plan - 12/12/19 1227    Clinical Impression Statement  Therapy assesed patients cervical spine today. She has increased motion restriction with rotation to the left and cervical flexion. She has spasming in her cervical spine and down into her periscapular area. She has weakness in bilateral UE. With manual therapy today she had decreased pain and numbness into her hands. She would benefit from skilled therapy to improve cervical motion and decrease spasming. Therapy also focused on soft tissue mobiliationto bilateral hisp. Her hips    Personal Factors and Comorbidities  Comorbidity 1;Comorbidity 2    Comorbidities  MS, OA    Examination-Activity Limitations  Locomotion Level;Carry;Stand;Lift;Stairs;Squat    Examination-Participation Restrictions  Meal Prep;Cleaning;Community Activity;Laundry;Shop    Stability/Clinical Decision Making  Evolving/Moderate complexity    Clinical Decision Making  Moderate    Rehab Potential  Good    PT Frequency  1x / week    PT Duration  6 weeks    PT Treatment/Interventions  ADLs/Self Care Home Management;Electrical Stimulation;Cryotherapy;Iontophoresis 4mg /ml Dexamethasone;Moist Heat;Traction;DME Instruction;Neuromuscular re-education;Patient/family education;Manual techniques;Passive range of motion;Taping    PT Next Visit Plan  continue to progress ther-ex as  tolerated; review lifting technique    PT Home Exercise Plan  LTR; hamstring stretch, tennis ball trigger point release ; abdominal breathing    Consulted and Agree with Plan of Care  Patient       Patient will benefit from skilled therapeutic intervention in order to improve the following deficits and impairments:  Abnormal gait, Difficulty walking, Decreased range of motion, Decreased mobility, Decreased strength, Postural dysfunction, Pain, Increased muscle spasms  Visit Diagnosis: Chronic bilateral  low back pain without sciatica  Muscle weakness (generalized)  Cramp and spasm  Difficulty in walking, not elsewhere classified     Problem List Patient Active Problem List   Diagnosis Date Noted  . Cervical radiculopathy 10/18/2019  . Numbness 09/21/2019  . Bilateral carpal tunnel syndrome 09/21/2019  . High risk medication use 09/21/2019  . Memory loss 09/21/2019  . Essential hypertension 08/25/2018  . Abnormal SPEP 02/19/2018  . Hand pain 02/20/2017  . Multiple joint pain 02/18/2017  . Right elbow pain 12/09/2016  . Disturbed cognition 06/25/2016  . Trochanteric bursitis of left hip 02/18/2016  . Sciatica, right side 10/30/2015  . Bilateral arm pain 10/10/2015  . Trochanteric bursitis of right hip 08/04/2014  . Adjustment disorder with mixed anxiety and depressed mood 08/04/2014  . Chronic fatigue 08/04/2014  . Urinary frequency 08/04/2014  . Abnormality of gait 08/04/2014  . Unspecified visual disturbance 01/31/2013  . Transient vision disturbance 01/31/2013  . Routine general medical examination at a health care facility 11/04/2011  . Colon polyps 11/03/2011  . Hot flashes, menopausal 10/13/2011  . POSTHERPETIC NEURALGIA 10/03/2009  . DISPLCMT LUMBAR INTERVERT [place] W/O MYELOPATHY 09/05/2009  . RENAL CALCULUS, RECURRENT 09/04/2009  . Hyperlipidemia 08/31/2009  . MULTIPLE SCLEROSIS, PROGRESSIVE/RELAPSING 08/31/2009  . ALLERGIC RHINITIS 08/31/2009    Carney Living 12/12/2019, 2:00 PM  [place], [place], [address] Phone: [phone]   Fax:  [phone]  Name: Dawn [name] MRN: [id number] Date of Birth: February 26, 1946

## 2019-12-20 ENCOUNTER — Ambulatory Visit (INDEPENDENT_AMBULATORY_CARE_PROVIDER_SITE_OTHER): Payer: Medicare Other | Admitting: Family

## 2019-12-20 ENCOUNTER — Ambulatory Visit
Admission: RE | Admit: 2019-12-20 | Discharge: 2019-12-20 | Disposition: A | Discharge status: Home / Self Care | Payer: Medicare Other | Source: Ambulatory Visit | Attending: Family | Admitting: Family

## 2019-12-20 ENCOUNTER — Encounter: Payer: Self-pay | Admitting: Family

## 2019-12-20 ENCOUNTER — Other Ambulatory Visit: Payer: Self-pay

## 2019-12-20 VITALS — BP 140/88 | HR 70 | Temp 97.5°F | Resp 16 | Ht 62.0 in | Wt 186.0 lb

## 2019-12-20 DIAGNOSIS — M069 Rheumatoid arthritis, unspecified: Secondary | ICD-10-CM | POA: Diagnosis not present

## 2019-12-20 DIAGNOSIS — M545 Low back pain: Secondary | ICD-10-CM

## 2019-12-20 DIAGNOSIS — M79605 Pain in left leg: Secondary | ICD-10-CM

## 2019-12-20 MED ORDER — ACETAMINOPHEN 500 MG PO TABS: 1000.0000 mg | ORAL_TABLET | Freq: Two times a day (BID) | ORAL | 0 refills | Status: DC

## 2019-12-20 NOTE — Progress Notes (Signed)
Provider: Richa Shor FNP-C  Gayland Curry, DO  Patient Care Team: Gayland Curry, DO as PCP - General (Geriatric Medicine) [name], MD as Consulting Physician (Orthopedic Surgery) [name], [name], MD (Neurology) [name], [name], [place] (Optometry) [name], NP as Nurse Practitioner (Obstetrics and Gynecology) [name], MD as Consulting Physician (Dermatology)  Extended Emergency Contact Information Primary Emergency Contact: Kathlen Mody Mobile Phone: [phone] Relation: Friend Secondary Emergency Contact: Crissie Reese Mobile Phone: [phone] Relation: Friend  Code Status:  Full Code  Goals of care: Advanced Directive information Advanced Directives 12/20/2019  Does Patient Have a Medical Advance Directive? Yes  Type of Advance Directive Living will  Does patient want to make changes to medical advance directive? No - Patient declined  Copy of [place] in Chart? -     Chief Complaint  Patient presents with  . Acute Visit    Left Back Pain.    HPI:  Pt is a 74 y.o. female seen today for an acute visit for evaluation of  Left lower back pain since 12/19/2019.States pain occurred after turning to clean spilled water " felt like I pulled a muscle on the back".Describes pain as achy and rates 7/10 on scale.Pain worst with bending and radiates down to the leg.Has used ibuprofen,ice/heat which has helped but not resolved.she states has been working with a physical therapy.Has multiple sclerosis and unsteady gait.she denies any leg weakness or new onset of incontinence. Also request referral to a Rheumatologist states has significant medical history of rheumatoid arthritis worst on the fingers.    Past Medical History:  Diagnosis Date  . Allergy   . Essential hypertension 08/25/2018  . Hot flashes, menopausal 10/13/2011   Estradiol started   . Memory loss 09/21/2019  . Multiple sclerosis ([place])   . Neuropathy   .  Osteoarthritis   . Vision abnormalities    Past Surgical History:  Procedure Laterality Date  . ABDOMINAL HYSTERECTOMY  2002   partial  . LUMBAR FUSION      No Known Allergies  Outpatient Encounter Medications as of 12/20/2019  Medication Sig  . aspirin EC 81 MG tablet Take 81 mg by mouth daily.  . calcium carbonate (OS-CAL) 600 MG TABS Take 600 mg by mouth daily.  . clonazePAM (KLONOPIN) 0.5 MG tablet Take 1 tablet by mouth at bedtime  . estradiol (CLIMARA - DOSED IN MG/24 HR) 0.025 mg/24hr patch Place 0.025 mg onto the skin once a week.  . fexofenadine (ALLEGRA) 180 MG tablet Take 180 mg by mouth daily.  [name] Kitchen leflunomide (ARAVA) 20 MG tablet TAKE ONE TABLET BY MOUTH DAILY  . losartan (COZAAR) 25 MG tablet TAKE 1 TABLET BY MOUTH EVERY DAY  . Misc Natural Products (SUPER-D3+) 5000 units CAPS Take by mouth daily.  . modafinil (PROVIGIL) 200 MG tablet TAKE 1 TABLET EVERY MORNING AND ONE-HALF TABLET EVERY AFTERNOON  . omeprazole (PRILOSEC) 40 MG capsule Take 1 capsule (40 mg total) by mouth daily.  . Polyethylene Glycol 3350 (MIRALAX PO) Take by mouth daily as needed.   . progesterone (PROMETRIUM) 100 MG capsule TAKE 1 CAPSULE BY MOUTH EVERY DAY AT BEDTIME  . solifenacin (VESICARE) 5 MG tablet TAKE ONE TABLET BY MOUTH DAILY  . traMADol (ULTRAM) 50 MG tablet Take 1 tablet (50 mg total) by mouth at bedtime.   No facility-administered encounter medications on file as of 12/20/2019.    Review of Systems  Constitutional: Negative for appetite change, chills, fatigue and fever.  Respiratory:  Negative for cough, chest tightness, shortness of breath and wheezing.   Cardiovascular: Negative for chest pain, palpitations and leg swelling.  Gastrointestinal: Negative for constipation, diarrhea, nausea and vomiting.  Genitourinary: Negative for difficulty urinating, flank pain, frequency and urgency.  Musculoskeletal: Positive for arthralgias, back pain and gait problem.       Left lower back pain  worst with bending.Achy 7/10 scale.radiates down to the leg.   Skin: Negative for color change, pallor and rash.  Neurological: Negative for dizziness, light-headedness and headaches.       Chronic Numbness and tingling.     Immunization History  Administered Date(s) Administered  . Fluad Quad(high Dose 65+) 04/01/2019  . Influenza Whole 05/14/2011  . Influenza, High Dose Seasonal PF 04/30/2017, 05/15/2018  . PFIZER SARS-COV-2 Vaccination 08/27/2019, 09/21/2019  . Pneumococcal Conjugate-13 08/30/2018  . Pneumococcal Polysaccharide-23 03/21/2009  . Td 12/04/2009  . Zoster 11/29/2009   Pertinent  Health Maintenance Due  Topic Date Due  . PNA vac Low Risk Adult (2 of 2 - PPSV23) 08/31/2019  . DEXA SCAN  10/16/2020 (Originally 12/20/2012)  . INFLUENZA VACCINE  02/19/2020  . MAMMOGRAM  03/02/2021   Fall Risk  12/20/2019 10/17/2019 08/30/2019 12/06/2018 10/25/2018  Falls in the past year? 0 0 0 0 0  Number falls in past yr: 0 0 0 0 0  Injury with Fall? 0 0 0 0 0  Risk for fall due to : - - - - -  Risk for fall due to: Comment - - - - -  Follow up - - - - -    Vitals:   12/20/19 1324  BP: 140/88  Pulse: 70  Resp: 16  Temp: (!) 97.5 F (36.4 C)  SpO2: 97%  Weight: 186 lb (84.4 kg)  Height: 5\' 2"  (1.575 m)   Body mass index is 34.02 kg/m. Physical Exam Constitutional:      General: She is not in acute distress.    Appearance: She is obese. She is not ill-appearing.  HENT:     Head: Normocephalic.  Eyes:     General: No scleral icterus.       Right eye: No discharge.        Left eye: No discharge.     Conjunctiva/sclera: Conjunctivae normal.     Pupils: Pupils are equal, round, and reactive to light.  Cardiovascular:     Rate and Rhythm: Normal rate and regular rhythm.     Pulses: Normal pulses.     Heart sounds: Normal heart sounds. No murmur. No friction rub. No gallop.   Pulmonary:     Effort: Pulmonary effort is normal. No respiratory distress.     Breath sounds:  Normal breath sounds. No wheezing, rhonchi or rales.  Abdominal:     General: Bowel sounds are normal. There is no distension.     Palpations: Abdomen is soft. There is no mass.     Tenderness: There is no abdominal tenderness. There is no right CVA tenderness, left CVA tenderness, guarding or rebound.  Musculoskeletal:        General: No swelling or tenderness.     Right lower leg: No edema.     Left lower leg: No edema.     Comments: Unsteady gait bilateral hands arthritic changes noted.   Skin:    General: Skin is warm.     Coloration: Skin is not pale.     Findings: No bruising, erythema or rash.  Neurological:     Mental Status:  She is alert and oriented to person, place, and time.     Cranial Nerves: No cranial nerve deficit.     Sensory: No sensory deficit.     Motor: No weakness.     Gait: Gait abnormal.  Psychiatric:        Mood and Affect: Mood normal.        Behavior: Behavior normal.        Thought Content: Thought content normal.        Judgment: Judgment normal.     Labs reviewed: Recent Labs    03/23/19 1053 09/21/19 1037  NA 139 140  K 4.6 4.4  CL 103 104  CO2 22 23  GLUCOSE 77 84  BUN 14 15  CREATININE 0.67 0.78  CALCIUM 9.8 9.4   Recent Labs    03/23/19 1053 09/21/19 1037  AST 19 24  ALT 18 24  ALKPHOS 134* 147*  BILITOT 0.3 0.3  PROT 6.0 6.2  ALBUMIN 4.2 4.3   Recent Labs    03/23/19 1053 09/21/19 1037  WBC 5.2 6.0  NEUTROABS 3.8 4.5  HGB 13.5 13.3  HCT 41.1 39.8  MCV 90 90  PLT 221 204   Lab Results  Component Value Date   TSH 4.260 09/21/2019   No results found for: HGBA1C Lab Results  Component Value Date   CHOL 242 (H) 10/17/2019   HDL 47 (L) 10/17/2019   LDLCALC 161 (H) 10/17/2019   LDLDIRECT 130.2 11/03/2011   TRIG 187 (H) 10/17/2019   CHOLHDL 5.1 (H) 10/17/2019    Significant Diagnostic Results in last 30 days:  No results found.  Assessment/Plan 1. Low back pain radiating to left lower extremity Acute pain  sustained after turning.No tenderness on palpation.Advised to take tylenol as below.will obtain imaging then refer to Orthopedic if indicated.  - DG Lumbar Spine Complete; Future - acetaminophen (TYLENOL) 500 MG tablet; Take 2 tablets (1,000 mg total) by mouth in the morning and at bedtime.  Dispense: 30 tablet; Refill: 0  2. Rheumatoid arthritis involving right hand, unspecified whether rheumatoid factor present ([place]) States worsen symptoms on fingers request referral to Rheumatologist.  - Ambulatory referral to Rheumatology  Family/ staff Communication: Reviewed plan of care with patient  Labs/tests ordered: None   Next Appointment: As needed if symptoms worsen or fail to improve.   Sandrea Hughs, NP

## 2019-12-20 NOTE — Patient Instructions (Signed)
-   Notify provider if symptoms worsen or fail to improve  -Take Extra strength Tylenol 1000 mg tablet one by mouth twice daily for lower back pain  -Please get lower back X-ray done at [facility] imaging.will call you with results.

## 2019-12-21 ENCOUNTER — Telehealth: Payer: Self-pay | Admitting: Orthopaedic Surgery

## 2019-12-21 NOTE — Telephone Encounter (Signed)
Yes that's fine. Thank you

## 2019-12-21 NOTE — Telephone Encounter (Signed)
Hey, There was an opening tomorrow at 3pm that I put her in. Will you please call and advise patient and see if she wants that appointment? I was not sure what we were seeing her for, so please add that to the appointment note if she decides to keep that appointment. Thanks!

## 2019-12-21 NOTE — Telephone Encounter (Signed)
Patient called.   She doesn't feel that she'll be able to make it the [facility]'s next available appointment. Wants to know if she can be seen sooner by him or released into the care of another provider here.   Call back: [phone]

## 2019-12-22 ENCOUNTER — Encounter: Payer: Self-pay | Admitting: Orthopaedic Surgery

## 2019-12-22 ENCOUNTER — Other Ambulatory Visit: Payer: Self-pay

## 2019-12-22 ENCOUNTER — Ambulatory Visit (INDEPENDENT_AMBULATORY_CARE_PROVIDER_SITE_OTHER): Payer: Medicare Other | Admitting: Orthopaedic Surgery

## 2019-12-22 VITALS — Ht 62.0 in | Wt 186.0 lb

## 2019-12-22 DIAGNOSIS — M461 Sacroiliitis, not elsewhere classified: Secondary | ICD-10-CM | POA: Diagnosis not present

## 2019-12-22 DIAGNOSIS — M47816 Spondylosis without myelopathy or radiculopathy, lumbar region: Secondary | ICD-10-CM

## 2019-12-22 DIAGNOSIS — G35 Multiple sclerosis: Secondary | ICD-10-CM

## 2019-12-22 DIAGNOSIS — M4156 Other secondary scoliosis, lumbar region: Secondary | ICD-10-CM

## 2019-12-22 NOTE — Progress Notes (Signed)
Office Visit Note   Patient: Dawn [name]           Date of Birth: Apr 28, 1946           MRN: [id number] Visit Date: 12/22/2019              Requested by: Gayland Curry, DO [place],  [place] [id number] PCP: Gayland Curry, DO   Assessment & Plan: Visit Diagnoses:  1. MULTIPLE SCLEROSIS, PROGRESSIVE/RELAPSING   2. Sacroiliitis, not elsewhere classified ([place])   3. Other secondary scoliosis, lumbar region   4. Arthritis of facet joint of lumbar spine     Plan:  #1: We are going to make a referral to Dr. Ernestina Patches or Dr. Junius Roads for injection of the left SI joint diagnostic and therapeutic. #2: Follow back up of this is not beneficial  Follow-Up Instructions: No follow-ups on file.   Orders:  No orders of the defined types were placed in this encounter.  No orders of the defined types were placed in this encounter.     Procedures: No procedures performed   Clinical Data: No additional findings.   Subjective: Chief Complaint  Patient presents with  . Lower Back - Follow-up   HPI Patient presents today for lower back pain. She was last here in February of 2020. She has been doing well until this past weekend. She twisted her back and felt immediate pain in the left side of her posterior pelvis. She states that the pain improved but then started to hurt again that evening. She saw her PCP two days and had x-rays. The x-rays are in PACS. If she does any activity or prolonged walking she has pain in her left upper leg. No groin pain. No new numbness or tingling. She has MS and already has numbness in both feet. She has been taking Tylenol, which helps ease the pain. She also tried Tramadol, but that did not help either. She has tried heat, cold, and rest.    Review of Systems  Constitutional: Negative for fatigue.  HENT: Negative for ear pain.   Eyes: Negative for pain.  Respiratory: Negative for shortness of breath.   Cardiovascular: Negative for leg  swelling.  Gastrointestinal: Negative for constipation and diarrhea.  Endocrine: Negative for cold intolerance and heat intolerance.  Genitourinary: Negative for difficulty urinating.  Musculoskeletal: Negative for joint swelling.  Skin: Negative for rash.  Allergic/Immunologic: Negative for food allergies.  Neurological: Negative for weakness.  Hematological: Does not bruise/bleed easily.  Psychiatric/Behavioral: Negative for sleep disturbance.     Objective: Vital Signs: Ht 5\' 2"  (1.575 m)   Wt 186 lb (84.4 kg)   BMI 34.02 kg/m   Physical Exam Constitutional:      Appearance: Normal appearance. She is well-developed. She is obese.  HENT:     Head: Normocephalic.  Eyes:     Pupils: Pupils are equal, round, and reactive to light.  Pulmonary:     Effort: Pulmonary effort is normal.  Skin:    General: Skin is warm and dry.  Neurological:     Mental Status: She is alert and oriented to person, place, and time.  Psychiatric:        Mood and Affect: Mood normal.        Behavior: Behavior normal.     Ortho Exam  Exam today reveals good strength in lower extremities with testing.  Deep tendon reflexes are absent.  She is exquisitely tender over the left sacroiliac  joint.  Nothing over the midline of the lumbar spine or to the right.  Negative straight leg raising.    Specialty Comments:  No specialty comments available.  Imaging: DG Lumbar Spine Complete  Result Date: 12/21/2019 CLINICAL DATA:  Low back pain EXAM: LUMBAR SPINE - COMPLETE 4+ VIEW COMPARISON:  None. FINDINGS: There is no evidence of lumbar spine fracture. No static listhesis. Mild S-shaped curvature of the thoracolumbar spine. Degenerative disease with disc height loss at L2-3, L3-4, L4-5 and L5-S1 with bilateral facet arthropathy most severe at L5-S1. Left renal calculus. Abdominal aortic atherosclerosis. Moderate osteoarthritis of the right hip. IMPRESSION: 1. Lumbar spine spondylosis as described above. 2.  No acute osseous injury of the lumbar spine. Electronically Signed   By: Kathreen Devoid   On: 12/21/2019 08:35     PMFS History: Current Outpatient Medications  Medication Sig Dispense Refill  . acetaminophen (TYLENOL) 500 MG tablet Take 2 tablets (1,000 mg total) by mouth in the morning and at bedtime. 30 tablet 0  . aspirin EC 81 MG tablet Take 81 mg by mouth daily.    . calcium carbonate (OS-CAL) 600 MG TABS Take 600 mg by mouth daily.    . clonazePAM (KLONOPIN) 0.5 MG tablet Take 1 tablet by mouth at bedtime 90 tablet 1  . estradiol (CLIMARA - DOSED IN MG/24 HR) 0.025 mg/24hr patch Place 0.025 mg onto the skin once a week.    . fexofenadine (ALLEGRA) 180 MG tablet Take 180 mg by mouth daily.    [name] Kitchen leflunomide (ARAVA) 20 MG tablet TAKE ONE TABLET BY MOUTH DAILY 90 tablet 4  . losartan (COZAAR) 25 MG tablet TAKE 1 TABLET BY MOUTH EVERY DAY 90 tablet 1  . Misc Natural Products (SUPER-D3+) 5000 units CAPS Take by mouth daily.    . modafinil (PROVIGIL) 200 MG tablet TAKE 1 TABLET EVERY MORNING AND ONE-HALF TABLET EVERY AFTERNOON 135 tablet 1  . omeprazole (PRILOSEC) 40 MG capsule Take 1 capsule (40 mg total) by mouth daily. 90 capsule 1  . Polyethylene Glycol 3350 (MIRALAX PO) Take by mouth daily as needed.     . progesterone (PROMETRIUM) 100 MG capsule TAKE 1 CAPSULE BY MOUTH EVERY DAY AT BEDTIME  3  . solifenacin (VESICARE) 5 MG tablet TAKE ONE TABLET BY MOUTH DAILY 90 tablet 1  . traMADol (ULTRAM) 50 MG tablet Take 1 tablet (50 mg total) by mouth at bedtime. 30 tablet 5   No current facility-administered medications for this visit.    Patient Active Problem List   Diagnosis Date Noted  . Sacroiliitis, not elsewhere classified ([place]) 12/22/2019  . Other secondary scoliosis, lumbar region 12/22/2019  . Arthritis of facet joint of lumbar spine 12/22/2019  . Cervical radiculopathy 10/18/2019  . Numbness 09/21/2019  . Bilateral carpal tunnel syndrome 09/21/2019  . High risk medication use  09/21/2019  . Memory loss 09/21/2019  . Essential hypertension 08/25/2018  . Abnormal SPEP 02/19/2018  . Hand pain 02/20/2017  . Multiple joint pain 02/18/2017  . Right elbow pain 12/09/2016  . Disturbed cognition 06/25/2016  . Trochanteric bursitis of left hip 02/18/2016  . Sciatica, right side 10/30/2015  . Bilateral arm pain 10/10/2015  . Trochanteric bursitis of right hip 08/04/2014  . Adjustment disorder with mixed anxiety and depressed mood 08/04/2014  . Chronic fatigue 08/04/2014  . Urinary frequency 08/04/2014  . Abnormality of gait 08/04/2014  . Unspecified visual disturbance 01/31/2013  . Transient vision disturbance 01/31/2013  . Routine general medical examination at  a health care facility 11/04/2011  . Colon polyps 11/03/2011  . Hot flashes, menopausal 10/13/2011  . POSTHERPETIC NEURALGIA 10/03/2009  . DISPLCMT LUMBAR INTERVERT [place] W/O MYELOPATHY 09/05/2009  . RENAL CALCULUS, RECURRENT 09/04/2009  . Hyperlipidemia 08/31/2009  . MULTIPLE SCLEROSIS, PROGRESSIVE/RELAPSING 08/31/2009  . ALLERGIC RHINITIS 08/31/2009   Past Medical History:  Diagnosis Date  . Allergy   . Essential hypertension 08/25/2018  . Hot flashes, menopausal 10/13/2011   Estradiol started   . Memory loss 09/21/2019  . Multiple sclerosis ([place])   . Neuropathy   . Osteoarthritis   . Vision abnormalities     Family History  Problem Relation Age of Onset  . Heart failure Mother   . Heart failure Father   . Arthritis Other   . Hypertension Other     Past Surgical History:  Procedure Laterality Date  . ABDOMINAL HYSTERECTOMY  2002   partial  . LUMBAR FUSION     Social History   Occupational History  . Occupation: retired  Tobacco Use  . Smoking status: Former Smoker    Packs/day: 0.50    Years: 15.00    Pack years: 7.50    Types: Cigarettes    Quit date: 08/05/1983    Years since quitting: 36.4  . Smokeless tobacco: Never Used  Substance and Sexual Activity  . Alcohol use: Yes     Alcohol/week: 0.0 standard drinks    Comment: rarely  . Drug use: No  . Sexual activity: Not Currently    Birth control/protection: Post-menopausal

## 2019-12-23 ENCOUNTER — Ambulatory Visit: Payer: Medicare Other | Attending: Neurology | Admitting: Physical Therapy

## 2019-12-23 ENCOUNTER — Encounter: Payer: Self-pay | Admitting: Physical Therapy

## 2019-12-23 DIAGNOSIS — G8929 Other chronic pain: Secondary | ICD-10-CM

## 2019-12-23 DIAGNOSIS — R262 Difficulty in walking, not elsewhere classified: Secondary | ICD-10-CM

## 2019-12-23 DIAGNOSIS — R252 Cramp and spasm: Secondary | ICD-10-CM | POA: Diagnosis present

## 2019-12-23 DIAGNOSIS — M542 Cervicalgia: Secondary | ICD-10-CM | POA: Diagnosis present

## 2019-12-23 DIAGNOSIS — M545 Low back pain, unspecified: Secondary | ICD-10-CM

## 2019-12-23 DIAGNOSIS — M6281 Muscle weakness (generalized): Secondary | ICD-10-CM | POA: Diagnosis present

## 2019-12-23 NOTE — Therapy (Signed)
[place], [place], [address] Phone: [phone]   Fax:  [phone]  Physical Therapy Treatment  Patient Details  Name: Dawn [name] MRN: [id number] Date of Birth: 04/28/1946 Referring Provider (PT): Dr Christell Constant   Encounter Date: 12/23/2019  PT End of Session - 12/23/19 1343    Visit Number  26    Number of Visits  31    Date for PT Re-Evaluation  01/23/20    Authorization Type  Medicare Kx Modifier on visit 13 proress not on 30th visit    PT Start Time  1100    PT Stop Time  1145    PT Time Calculation (min)  45 min    Activity Tolerance  Patient tolerated treatment well    Behavior During Therapy  [facility] for tasks assessed/performed       Past Medical History:  Diagnosis Date  . Allergy   . Essential hypertension 08/25/2018  . Hot flashes, menopausal 10/13/2011   Estradiol started   . Memory loss 09/21/2019  . Multiple sclerosis ([place])   . Neuropathy   . Osteoarthritis   . Vision abnormalities     Past Surgical History:  Procedure Laterality Date  . ABDOMINAL HYSTERECTOMY  2002   partial  . LUMBAR FUSION      There were no vitals filed for this visit.  Subjective Assessment - 12/23/19 1341    Subjective  Patient had an acute strain of her low back on 01/18/2020. Since that point she has had significant pain in her lower back and into her gluteal. She has been to orthopedic who reccomends therapy.    How long can you stand comfortably?  hurts as soon as she stands. Has to shift frequently    How long can you walk comfortably?  limited community ambualtion    Diagnostic tests  Nothign recent    Currently in Pain?  Yes    Pain Score  8     Pain Location  Hip    Pain Orientation  Right    Pain Descriptors / Indicators  Aching    Pain Radiating Towards  pain into the right buttock    Pain Onset  More than a month ago    Pain Frequency  Constant    Aggravating Factors   standing and  walking    Pain Relieving Factors  rest and stretching    Effect of Pain on Daily Activities  difficulty walking distances                        [facility] Adult PT Treatment/Exercise - 12/23/19 0001      Modalities   Modalities  Ultrasound;Teacher, English as a foreign language Location  right gluteal     Electrical Stimulation Action  to reduce inflammation     Electrical Stimulation Parameters  to tolerance     Electrical Stimulation Goals  Edema;Pain      Ultrasound   Ultrasound Location  right gluteal 50% to reduce inflamation 1.5 w/cm2     Ultrasound Goals  Edema      Manual Therapy   Manual Therapy  Passive ROM;Manual Traction    Manual therapy comments  skilled palpation of trigger points     Soft tissue mobilization  to lumbar spine and upper gluteals bilateral ; trigger point release to bilateral upper traps     Passive ROM  focus on  left hip hip flexionIR/ER     Manual Traction  gentle manual traction to cervical spine.              PT Education - 12/23/19 1343    Education Details  HEP and symptom management    Person(s) Educated  Patient    Methods  Explanation;Demonstration;Tactile cues;Verbal cues    Comprehension  Returned demonstration;Verbal cues required;Tactile cues required;Verbalized understanding       PT Short Term Goals - 12/12/19 1333      PT SHORT TERM GOAL #1   Title  Patient will report no pain radiating into anterior thigh    Baseline  ino radiucalr pain at this time    Time  4    Period  Weeks    Status  Achieved      PT SHORT TERM GOAL #2   Title  Patient will increase bilateral LE strength to 4/5    Baseline  4+/5 gross    Time  4    Period  Weeks    Status  Achieved    Target Date  05/24/19      PT SHORT TERM GOAL #3   Title  Patient will increase passive right hip flexion to 105 degrrees    Baseline  100 today with pain    Time  4    Period  Weeks    Status  On-going     Target Date  05/24/19      PT SHORT TERM GOAL #4   Title  Patient will increase left cervical rotation by 10 degrees    Time  4    Period  Weeks    Status  New    Target Date  01/09/20      PT SHORT TERM GOAL #5   Title  Patient will increase ross bilateral UE strength to 4/5    Time  4    Period  Weeks    Status  New    Target Date  01/09/20        PT Long Term Goals - 12/12/19 1351      PT LONG TERM GOAL #1   Title  Patient will stand for 30 min without self reported increase pain in order to stand at a kitchen sink.    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 her art     Baseline  continues to have pain    Time  6    Period  Weeks    Status  On-going    Target Date  01/23/20      PT LONG TERM GOAL #4   Title  Patient will reach behind his head without pain in order to perfrom ADL's    Time  6    Period  Weeks    Status  New    Target Date  01/23/20  PT LONG TERM GOAL #5   Title  Patient will report no radicular pain into her hands    Time  6    Period  Weeks    Status  New    Target Date  01/23/20      Additional Long Term Goals   Additional Long Term Goals  Yes      PT LONG TERM GOAL #6   Title  POatient will improve ABC score to 50% function in order to decrease her risk of fall    Time  6    Period  Weeks    Status  New    Target Date  01/23/20            Plan - 12/23/19 1345    Clinical Impression Statement  Therapy focused on modalities today. She reported a signficant improvement in pain after the treatment. She had  spasming in her gluteal. Therapy talked to her aboutdealing with an acute injury v. the chronic problems she has been having. She was advised tro rest over thew weekend but make sure to stand and walk everyt hour. Therapy will see her again monday. We will continue with manual therapy and hopefully progress into light exercises and movement as toelrated.    Personal Factors and Comorbidities  Comorbidity 1;Comorbidity 2    Comorbidities  MS, OA    Examination-Activity Limitations  Locomotion Level;Carry;Stand;Lift;Stairs;Squat    Examination-Participation Restrictions  Meal Prep;Cleaning;Community Activity;Laundry;Shop    Stability/Clinical Decision Making  Evolving/Moderate complexity    Clinical Decision Making  Moderate    Rehab Potential  Good    PT Frequency  1x / week    PT Duration  6 weeks    PT Treatment/Interventions  ADLs/Self Care Home Management;Electrical Stimulation;Cryotherapy;Iontophoresis 4mg /ml Dexamethasone;Moist Heat;Traction;DME Instruction;Neuromuscular re-education;Patient/family education;Manual techniques;Passive range of motion;Taping       Patient will benefit from skilled therapeutic intervention in order to improve the following deficits and impairments:  Abnormal gait, Difficulty walking, Decreased range of motion, Decreased mobility, Decreased strength, Postural dysfunction, Pain, Increased muscle spasms  Visit Diagnosis: Chronic bilateral low back pain without sciatica  Muscle weakness (generalized)  Cramp and spasm  Difficulty in walking, not elsewhere classified     Problem List Patient Active Problem List   Diagnosis Date Noted  . Sacroiliitis, not elsewhere classified ([place]) 12/22/2019  . Other secondary scoliosis, lumbar region 12/22/2019  . Arthritis of facet joint of lumbar spine 12/22/2019  . Cervical radiculopathy 10/18/2019  . Numbness 09/21/2019  . Bilateral carpal tunnel syndrome 09/21/2019  . High risk medication use 09/21/2019  .  Memory loss 09/21/2019  . Essential hypertension 08/25/2018  . Abnormal SPEP 02/19/2018  . Hand pain 02/20/2017  . Multiple joint pain 02/18/2017  . Right elbow pain 12/09/2016  . Disturbed cognition 06/25/2016  . Trochanteric bursitis of left hip 02/18/2016  . Sciatica, right side 10/30/2015  . Bilateral arm pain 10/10/2015  . Trochanteric bursitis of right hip 08/04/2014  . Adjustment disorder with mixed anxiety and depressed mood 08/04/2014  . Chronic fatigue 08/04/2014  . Urinary frequency 08/04/2014  . Abnormality of gait 08/04/2014  . Unspecified visual disturbance 01/31/2013  . Transient vision disturbance 01/31/2013  . Routine general medical examination at a health care facility 11/04/2011  . Colon polyps 11/03/2011  . Hot flashes, menopausal 10/13/2011  . POSTHERPETIC NEURALGIA 10/03/2009  . DISPLCMT LUMBAR INTERVERT [place] W/O MYELOPATHY 09/05/2009  . RENAL CALCULUS, RECURRENT 09/04/2009  . Hyperlipidemia 08/31/2009  . MULTIPLE SCLEROSIS, PROGRESSIVE/RELAPSING 08/31/2009  .  ALLERGIC RHINITIS 08/31/2009    Carney Living PT DPT  12/23/2019, 1:50 PM  [place], [place], [address] Phone: [phone]   Fax:  [phone]  Name: Dawn [name] MRN: [id number] Date of Birth: 12/12/1945

## 2019-12-26 ENCOUNTER — Other Ambulatory Visit: Payer: Self-pay

## 2019-12-26 ENCOUNTER — Ambulatory Visit: Payer: Medicare Other | Admitting: Physical Therapy

## 2019-12-26 ENCOUNTER — Encounter: Payer: Self-pay | Admitting: Physical Therapy

## 2019-12-26 DIAGNOSIS — M6281 Muscle weakness (generalized): Secondary | ICD-10-CM

## 2019-12-26 DIAGNOSIS — R252 Cramp and spasm: Secondary | ICD-10-CM

## 2019-12-26 DIAGNOSIS — G8929 Other chronic pain: Secondary | ICD-10-CM

## 2019-12-26 DIAGNOSIS — R262 Difficulty in walking, not elsewhere classified: Secondary | ICD-10-CM

## 2019-12-26 DIAGNOSIS — M545 Low back pain: Secondary | ICD-10-CM | POA: Diagnosis not present

## 2019-12-27 ENCOUNTER — Encounter: Payer: Self-pay | Admitting: Physical Therapy

## 2019-12-27 NOTE — Therapy (Signed)
[place], [place], [address] Phone: [phone]   Fax:  [phone]  Physical Therapy Treatment  Patient Details  Name: Dawn [name] MRN: [id number] Date of Birth: November 09, 1945 Referring Provider (PT): Dr Christell Constant   Encounter Date: 12/26/2019  PT End of Session - 12/27/19 0950    Visit Number  27    Number of Visits  31    Date for PT Re-Evaluation  01/23/20    Authorization Type  Medicare Kx Modifier on visit 13 proress not on 30th visit    PT Start Time  1015    PT Stop Time  1056    PT Time Calculation (min)  41 min    Activity Tolerance  Patient tolerated treatment well    Behavior During Therapy  [facility] for tasks assessed/performed       Past Medical History:  Diagnosis Date  . Allergy   . Essential hypertension 08/25/2018  . Hot flashes, menopausal 10/13/2011   Estradiol started   . Memory loss 09/21/2019  . Multiple sclerosis ([place])   . Neuropathy   . Osteoarthritis   . Vision abnormalities     Past Surgical History:  Procedure Laterality Date  . ABDOMINAL HYSTERECTOMY  2002   partial  . LUMBAR FUSION      There were no vitals filed for this visit.  Subjective Assessment - 12/26/19 1049    Subjective  Patient reports her back is better. She is still feeling it but it is much better then last week. She has been performing more activity at home.    Pertinent History  MS    How long can you stand comfortably?  hurts as soon as she stands. Has to shift frequently    How long can you walk comfortably?  limited community ambualtion    Diagnostic tests  Nothign recent    Currently in Pain?  Yes    Pain Score  6     Pain Location  Hip    Pain Orientation  Right    Pain Descriptors / Indicators  Aching    Pain Type  Chronic pain    Pain Radiating Towards  pain in the buttock    Pain Onset  More than a month ago    Pain Frequency  Constant    Aggravating Factors   standing and walking     Pain Relieving Factors  rest and stretching    Effect of Pain on Daily Activities  difficulty walking distances    Multiple Pain Sites  No                        [facility] Adult PT Treatment/Exercise - 12/27/19 0001      Lumbar Exercises: Stretches   Active Hamstring Stretch  3 reps;20 seconds    Lower Trunk Rotation Limitations  x20    Piriformis Stretch  3 reps;20 seconds      Lumbar Exercises: Supine   Pelvic Tilt Limitations  x10     Clam Limitations  supine red band 2x10     Other Supine Lumbar Exercises  vall squeeze with breathing x20       Electrical Stimulation   Electrical Stimulation Location  Low back     Electrical Stimulation Action  IFC    Electrical Stimulation Parameters  to tolerance     Electrical Stimulation Goals  Edema;Pain      Manual Therapy   Manual Therapy  Passive ROM;Manual Traction    Manual therapy comments  skilled palpation of trigger points     Joint Mobilization  PA glides from L2 to L5     Soft tissue mobilization  to lumbar spine and upper gluteals bilateral ; trigger point release to bilateral upper traps     Manual Traction  gentle manual traction to cervical spine.        Trigger Point Dry Needling - 12/27/19 0001    Consent Given?  Yes    Dry Needling Comments  3 needles in gluteals right 30x75 needle     Gluteus Medius Response  Twitch response elicited           PT Education - 12/27/19 0947    Education Details  Reviewed HEP and symptom mangement    Person(s) Educated  Patient    Methods  Explanation;Demonstration;Tactile cues;Verbal cues    Comprehension  Verbalized understanding;Returned demonstration;Verbal cues required;Tactile cues required       PT Short Term Goals - 12/27/19 0953      PT SHORT TERM GOAL #1   Title  Patient will report no pain radiating into anterior thigh    Baseline  ino radiucalr pain at this time    Time  4    Period  Weeks    Status  Achieved    Target Date  12/09/19       PT SHORT TERM GOAL #2   Title  Patient will increase bilateral LE strength to 4/5    Baseline  4+/5 gross    Time  4    Period  Weeks    Status  Achieved    Target Date  05/24/19      PT SHORT TERM GOAL #3   Title  Patient will increase passive right hip flexion to 105 degrrees    Baseline  100 today with pain    Time  4    Period  Weeks    Status  On-going    Target Date  05/24/19      PT SHORT TERM GOAL #4   Title  Patient will increase left cervical rotation by 10 degrees    Time  4    Period  Weeks    Status  On-going    Target Date  01/09/20      PT SHORT TERM GOAL #5   Title  Patient will increase ross bilateral UE strength to 4/5    Time  4    Period  Weeks    Status  On-going    Target Date  01/09/20        PT Long Term Goals - 12/12/19 1351      PT LONG TERM GOAL #1   Title  Patient will stand for 30 min without self reported increase pain in order to stand at a kitchen sink.    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     Baseline  continues to have pain    Time  6    Period  Weeks    Status  On-going    Target Date  01/23/20      PT LONG TERM GOAL #4   Title  Patient will reach behind his head without pain in order to perfrom ADL's    Time  6    Period  Weeks    Status  New    Target Date  01/23/20      PT LONG TERM GOAL #5   Title  Patient will report no radicular pain into her hands    Time  6    Period  Weeks    Status  New    Target Date  01/23/20      Additional Long Term Goals   Additional Long Term Goals  Yes       PT LONG TERM GOAL #6   Title  POatient will improve ABC score to 50% function in order to decrease her risk of fall    Time  6    Period  Weeks    Status  New    Target Date  01/23/20            Plan - 12/27/19 0951    Clinical Impression Statement  Patient was doing bettetr today. her pain level has decreased. Therapy pewrfromed needling to further reduce right gluteal spasming. She was also able to begin perfroming exercises again. She had no increase in pain. She has increased her activity at home.    Personal Factors and Comorbidities  Comorbidity 1;Comorbidity 2    Comorbidities  MS, OA    Examination-Activity Limitations  Locomotion Level;Carry;Stand;Lift;Stairs;Squat    Examination-Participation Restrictions  Meal Prep;Cleaning;Community Activity;Laundry;Shop    Stability/Clinical Decision Making  Evolving/Moderate complexity    Clinical Decision Making  Moderate    Rehab Potential  Good    PT Frequency  1x / week    PT Duration  6 weeks    PT Treatment/Interventions  ADLs/Self Care Home Management;Electrical Stimulation;Cryotherapy;Iontophoresis 4mg /ml Dexamethasone;Moist Heat;Traction;DME Instruction;Neuromuscular re-education;Patient/family education;Manual techniques;Passive range of motion;Taping    PT Next Visit Plan  continue to progress ther-ex as tolerated; review lifting technique    PT Home Exercise Plan  LTR; hamstring stretch, tennis ball trigger point release ; abdominal breathing    Consulted and Agree with Plan of Care  Patient       Patient will benefit from skilled therapeutic intervention in order to improve the following deficits and impairments:  Abnormal gait, Difficulty walking, Decreased range of motion, Decreased mobility, Decreased strength, Postural dysfunction, Pain, Increased muscle spasms  Visit Diagnosis: Chronic bilateral low back pain without sciatica  Muscle weakness (generalized)  Cramp and spasm  Difficulty in walking, not  elsewhere classified     Problem List Patient Active Problem List   Diagnosis Date Noted  . Sacroiliitis, not elsewhere classified ([place]) 12/22/2019  . Other secondary scoliosis, lumbar region 12/22/2019  . Arthritis of facet joint of lumbar spine 12/22/2019  . Cervical radiculopathy 10/18/2019  . Numbness 09/21/2019  . Bilateral carpal tunnel syndrome 09/21/2019  . High risk medication use 09/21/2019  . Memory loss 09/21/2019  . Essential hypertension 08/25/2018  . Abnormal SPEP 02/19/2018  . Hand pain 02/20/2017  . Multiple joint pain 02/18/2017  . Right elbow pain 12/09/2016  . Disturbed cognition 06/25/2016  . Trochanteric bursitis of left hip  02/18/2016  . Sciatica, right side 10/30/2015  . Bilateral arm pain 10/10/2015  . Trochanteric bursitis of right hip 08/04/2014  . Adjustment disorder with mixed anxiety and depressed mood 08/04/2014  . Chronic fatigue 08/04/2014  . Urinary frequency 08/04/2014  . Abnormality of gait 08/04/2014  . Unspecified visual disturbance 01/31/2013  . Transient vision disturbance 01/31/2013  . Routine general medical examination at a health care facility 11/04/2011  . Colon polyps 11/03/2011  . Hot flashes, menopausal 10/13/2011  . POSTHERPETIC NEURALGIA 10/03/2009  . DISPLCMT LUMBAR INTERVERT [place] W/O MYELOPATHY 09/05/2009  . RENAL CALCULUS, RECURRENT 09/04/2009  . Hyperlipidemia 08/31/2009  . MULTIPLE SCLEROSIS, PROGRESSIVE/RELAPSING 08/31/2009  . ALLERGIC RHINITIS 08/31/2009    Carney Living PT DPT  12/27/2019, 9:57 AM  [facility] [address] [place], [place], [address] Phone: [phone]   Fax:  [phone]  Name: Dawn [name] MRN: [id number] Date of Birth: Oct 04, 1945

## 2020-01-02 ENCOUNTER — Ambulatory Visit: Payer: Medicare Other | Admitting: Physical Therapy

## 2020-01-02 ENCOUNTER — Other Ambulatory Visit: Payer: Self-pay

## 2020-01-02 ENCOUNTER — Encounter: Payer: Self-pay | Admitting: Physical Therapy

## 2020-01-02 DIAGNOSIS — R252 Cramp and spasm: Secondary | ICD-10-CM

## 2020-01-02 DIAGNOSIS — G8929 Other chronic pain: Secondary | ICD-10-CM

## 2020-01-02 DIAGNOSIS — M6281 Muscle weakness (generalized): Secondary | ICD-10-CM

## 2020-01-02 DIAGNOSIS — M545 Low back pain: Secondary | ICD-10-CM | POA: Diagnosis not present

## 2020-01-02 DIAGNOSIS — R262 Difficulty in walking, not elsewhere classified: Secondary | ICD-10-CM

## 2020-01-02 NOTE — Therapy (Signed)
[place], [place], [address] Phone: [phone]   Fax:  [phone]  Physical Therapy Treatment  Patient Details  Name: Dawn [name] MRN: [id number] Date of Birth: Mar 22, 1946 Referring Provider (PT): Dr Christell Constant   Encounter Date: 01/02/2020   PT End of Session - 01/02/20 1612    Visit Number 28    Number of Visits 31    Date for PT Re-Evaluation 01/23/20    Authorization Type Medicare Kx Modifier on visit 13 proress not on 30th visit    PT Start Time 1015    PT Stop Time 1059    PT Time Calculation (min) 44 min    Activity Tolerance Patient tolerated treatment well    Behavior During Therapy [facility] for tasks assessed/performed           Past Medical History:  Diagnosis Date  . Allergy   . Essential hypertension 08/25/2018  . Hot flashes, menopausal 10/13/2011   Estradiol started   . Memory loss 09/21/2019  . Multiple sclerosis ([place])   . Neuropathy   . Osteoarthritis   . Vision abnormalities     Past Surgical History:  Procedure Laterality Date  . ABDOMINAL HYSTERECTOMY  2002   partial  . LUMBAR FUSION      There were no vitals filed for this visit.   Subjective Assessment - 01/02/20 1023    Subjective Patient reports her pain is much better, She continues to have pain in the right hip.    Pertinent History MS    How long can you stand comfortably? hurts as soon as she stands. Has to shift frequently    How long can you walk comfortably? limited community ambualtion    Diagnostic tests Nothign recent    Currently in Pain? Yes    Pain Score 6     Pain Location Hip    Pain Orientation Right    Pain Descriptors / Indicators Aching    Pain Type Chronic pain    Pain Onset More than a month ago    Pain Frequency Constant    Aggravating Factors  standing and walking    Pain Relieving Factors rest and stretching    Effect of Pain on Daily Activities difficulty walking distances     Multiple Pain Sites No                             [facility] Adult PT Treatment/Exercise - 01/02/20 0001      Lumbar Exercises: Stretches   Lower Trunk Rotation Limitations x20      Lumbar Exercises: Seated   Other Seated Lumbar Exercises bilateral er 2x10 red; seated flexion 1lb 2x10; scaption 2x10  1lb ;       Manual Therapy   Manual Therapy Passive ROM;Manual Traction    Manual therapy comments skilled palpation of trigger points     Joint Mobilization PA glides from L2 to L5     Soft tissue mobilization to lumbar spine and upper gluteals bilateral ; trigger point release to bilateral upper traps     Manual Traction gentle manual traction to cervical spine.                   PT Education - 01/02/20 1556    Education Details reviewed improtance of postural exercises    Person(s) Educated Patient    Methods Explanation;Demonstration;Tactile cues;Verbal cues    Comprehension Verbalized understanding;Returned  demonstration;Verbal cues required;Tactile cues required            PT Short Term Goals - 12/27/19 0953      PT SHORT TERM GOAL #1   Title Patient will report no pain radiating into anterior thigh    Baseline ino radiucalr pain at this time    Time 4    Period Weeks    Status Achieved    Target Date 12/09/19      PT SHORT TERM GOAL #2   Title Patient will increase bilateral LE strength to 4/5    Baseline 4+/5 gross    Time 4    Period Weeks    Status Achieved    Target Date 05/24/19      PT SHORT TERM GOAL #3   Title Patient will increase passive right hip flexion to 105 degrrees    Baseline 100 today with pain    Time 4    Period Weeks    Status On-going    Target Date 05/24/19      PT SHORT TERM GOAL #4   Title Patient will increase left cervical rotation by 10 degrees    Time 4    Period Weeks    Status On-going    Target Date 01/09/20      PT SHORT TERM GOAL #5   Title Patient will increase ross bilateral UE strength to  4/5    Time 4    Period Weeks    Status On-going    Target Date 01/09/20             PT Long Term Goals - 12/12/19 1351      PT LONG TERM GOAL #1   Title Patient will stand for 30 min without self reported increase pain in order to stand at a kitchen sink.    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     Baseline continues to have pain    Time 6    Period Weeks    Status On-going    Target Date 01/23/20      PT LONG TERM GOAL #4   Title Patient will reach behind his head without pain in order to perfrom ADL's    Time 6    Period Weeks    Status New    Target Date 01/23/20      PT LONG TERM GOAL #5   Title Patient will report no radicular pain into her hands    Time 6    Period Weeks    Status New    Target Date 01/23/20      Additional Long Term Goals   Additional Long Term Goals Yes      PT LONG TERM GOAL #6   Title POatient will improve ABC score to 50% function in order to decrease her risk of fall    Time 6    Period Weeks  Status New    Target Date 01/23/20                 Plan - 01/02/20 1430    Clinical Impression Statement Patient was able to get back to ther-ex today. She has decreased pain overall in her left hip and right hip. The pain is more along the lines of her usual pain. Therapy was able to start postrual correctiuon with the patient again.She was given light ecxercises with hand weights. She has some weights at home and will work on the exercises.    Personal Factors and Comorbidities Comorbidity 1;Comorbidity 2     Comorbidities MS, OA    Examination-Activity Limitations Locomotion Level;Carry;Stand;Lift;Stairs;Squat    Stability/Clinical Decision Making Evolving/Moderate complexity    Clinical Decision Making Moderate    Rehab Potential Good    PT Frequency 1x / week    PT Duration 6 weeks    PT Treatment/Interventions ADLs/Self Care Home Management;Electrical Stimulation;Cryotherapy;Iontophoresis 4mg /ml Dexamethasone;Moist Heat;Traction;DME Instruction;Neuromuscular re-education;Patient/family education;Manual techniques;Passive range of motion;Taping    PT Next Visit Plan continue to progress ther-ex as tolerated; review lifting technique    PT Home Exercise Plan LTR; hamstring stretch, tennis ball trigger point release ; abdominal breathing    Consulted and Agree with Plan of Care Patient           Patient will benefit from skilled therapeutic intervention in order to improve the following deficits and impairments:  Abnormal gait, Difficulty walking, Decreased range of motion, Decreased mobility, Decreased strength, Postural dysfunction, Pain, Increased muscle spasms  Visit Diagnosis: Chronic bilateral low back pain without sciatica  Muscle weakness (generalized)  Cramp and spasm  Difficulty in walking, not elsewhere classified     Problem List Patient Active Problem List   Diagnosis Date Noted  . Sacroiliitis, not elsewhere classified ([place]) 12/22/2019  . Other secondary scoliosis, lumbar region 12/22/2019  . Arthritis of facet joint of lumbar spine 12/22/2019  . Cervical radiculopathy 10/18/2019  . Numbness 09/21/2019  . Bilateral carpal tunnel syndrome 09/21/2019  . High risk medication use 09/21/2019  . Memory loss 09/21/2019  . Essential hypertension 08/25/2018  . Abnormal SPEP 02/19/2018  . Hand pain 02/20/2017  . Multiple joint pain 02/18/2017  . Right elbow pain 12/09/2016  . Disturbed cognition 06/25/2016  . Trochanteric bursitis of left hip 02/18/2016  . Sciatica,  right side 10/30/2015  . Bilateral arm pain 10/10/2015  . Trochanteric bursitis of right hip 08/04/2014  . Adjustment disorder with mixed anxiety and depressed mood 08/04/2014  . Chronic fatigue 08/04/2014  . Urinary frequency 08/04/2014  . Abnormality of gait 08/04/2014  . Unspecified visual disturbance 01/31/2013  . Transient vision disturbance 01/31/2013  . Routine general medical examination at a health care facility 11/04/2011  . Colon polyps 11/03/2011  . Hot flashes, menopausal 10/13/2011  . POSTHERPETIC NEURALGIA 10/03/2009  . DISPLCMT LUMBAR INTERVERT [place] W/O MYELOPATHY 09/05/2009  . RENAL CALCULUS, RECURRENT 09/04/2009  . Hyperlipidemia 08/31/2009  . MULTIPLE SCLEROSIS, PROGRESSIVE/RELAPSING 08/31/2009  . ALLERGIC RHINITIS 08/31/2009    Carney Living PT DPT 01/02/2020, 4:47 PM  [facility] [address] [place], [place], [address] Phone: [phone]   Fax:  [phone]  Name: Dawn [name] MRN: [id number] Date of Birth: 06/11/1946

## 2020-01-09 ENCOUNTER — Other Ambulatory Visit: Payer: Self-pay

## 2020-01-09 ENCOUNTER — Ambulatory Visit: Payer: Medicare Other | Admitting: Physical Therapy

## 2020-01-09 DIAGNOSIS — R252 Cramp and spasm: Secondary | ICD-10-CM

## 2020-01-09 DIAGNOSIS — R262 Difficulty in walking, not elsewhere classified: Secondary | ICD-10-CM

## 2020-01-09 DIAGNOSIS — M545 Low back pain, unspecified: Secondary | ICD-10-CM

## 2020-01-09 DIAGNOSIS — M6281 Muscle weakness (generalized): Secondary | ICD-10-CM

## 2020-01-09 DIAGNOSIS — G8929 Other chronic pain: Secondary | ICD-10-CM

## 2020-01-09 NOTE — Therapy (Signed)
[place], [place], [address] Phone: [phone]   Fax:  [phone]  Physical Therapy Treatment  Patient Details  Name: Dawn [name] MRN: [id number] Date of Birth: [birth year] Referring Provider (PT): Dr Christell Constant   Encounter Date: 01/09/2020   PT End of Session - 01/09/20 1026    Visit Number 29    Number of Visits 31    Date for PT Re-Evaluation 01/23/20    Authorization Type Medicare Kx Modifier on visit 13 proress not on 30th visit    PT Start Time 1015    PT Stop Time 1057    PT Time Calculation (min) 42 min    Activity Tolerance Patient tolerated treatment well    Behavior During Therapy [facility] for tasks assessed/performed           Past Medical History:  Diagnosis Date  . Allergy   . Essential hypertension 08/25/2018  . Hot flashes, menopausal 10/13/2011   Estradiol started   . Memory loss 09/21/2019  . Multiple sclerosis ([place])   . Neuropathy   . Osteoarthritis   . Vision abnormalities     Past Surgical History:  Procedure Laterality Date  . ABDOMINAL HYSTERECTOMY  2002   partial  . LUMBAR FUSION      There were no vitals filed for this visit.   Subjective Assessment - 01/09/20 1022    Subjective Patient reports the back has improved. She continues to have her "normal" pain in the hips. Her neck and shoulders have been tight.    Pertinent History MS    How long can you stand comfortably? hurts as soon as she stands. Has to shift frequently    How long can you walk comfortably? limited community ambualtion    Diagnostic tests Nothign recent    Currently in Pain? Yes    Pain Score 4     Pain Location Hip    Pain Orientation Right    Pain Descriptors / Indicators Aching    Pain Type Chronic pain    Pain Radiating Towards into the buttock    Pain Onset More than a month ago    Pain Frequency Constant    Aggravating Factors  standing and walking    Pain Relieving Factors rest  and stretching    Effect of Pain on Daily Activities difficulty walking distances                             [facility] Adult PT Treatment/Exercise - 01/09/20 0001      Neck Exercises: Seated   Other Seated Exercise 2x10 forward flexion    Other Seated Exercise 2x10 Ys       Lumbar Exercises: Standing   Row Limitations 2x10 yellow    Shoulder Extension Limitations 2x10 yellow      Manual Therapy   Manual Therapy Manual Traction    Soft tissue mobilization to upper traps; triger point release    Manual Traction manual traction to the cervical spine      Neck Exercises: Stretches   Upper Trapezius Stretch 2 reps;20 seconds;Right    Levator Stretch 2 reps;20 seconds;Right;Left                    PT Short Term Goals - 12/27/19 0953      PT SHORT TERM GOAL #1   Title Patient will report no pain radiating into anterior thigh  Baseline ino radiucalr pain at this time    Time 4    Period Weeks    Status Achieved    Target Date 12/09/19      PT SHORT TERM GOAL #2   Title Patient will increase bilateral LE strength to 4/5    Baseline 4+/5 gross    Time 4    Period Weeks    Status Achieved    Target Date 05/24/19      PT SHORT TERM GOAL #3   Title Patient will increase passive right hip flexion to 105 degrrees    Baseline 100 today with pain    Time 4    Period Weeks    Status On-going    Target Date 05/24/19      PT SHORT TERM GOAL #4   Title Patient will increase left cervical rotation by 10 degrees    Time 4    Period Weeks    Status On-going    Target Date 01/09/20      PT SHORT TERM GOAL #5   Title Patient will increase ross bilateral UE strength to 4/5    Time 4    Period Weeks    Status On-going    Target Date 01/09/20             PT Long Term Goals - 12/12/19 1351      PT LONG TERM GOAL #1   Title Patient will stand for 30 min without self reported increase pain in order to stand at a kitchen sink.    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     Baseline continues to have pain    Time 6    Period Weeks    Status On-going    Target Date 01/23/20      PT LONG TERM GOAL #4   Title Patient will reach behind his head without pain in order to perfrom ADL's    Time 6    Period Weeks    Status New    Target Date 01/23/20      PT LONG TERM GOAL #5   Title Patient will report no radicular pain into her hands    Time 6    Period Weeks    Status New    Target Date 01/23/20      Additional Long Term Goals   Additional Long Term Goals Yes      PT LONG TERM GOAL #6   Title POatient will improve ABC score to 50% function in order to decrease her risk of fall    Time 6    Period Weeks    Status New    Target Date 01/23/20                 Plan - 01/09/20 1250    Clinical Impression Statement Patient has decreased pain overall in her hip and some pain and numbness  in left and right hands. Therapy was able to work on neck stretching, right side was tighter. She was given exercises with the theraband to help with posture and shoulder exercises with light hand weights. Patient will work on exercises at home.    PT Treatment/Interventions ADLs/Self Care Home Management;Electrical Stimulation;Cryotherapy;Iontophoresis 4mg /ml Dexamethasone;Moist Heat;Traction;DME Instruction;Neuromuscular re-education;Patient/family education;Manual techniques;Passive range of motion;Taping    PT Next Visit Plan work on balance with patient,    PT Home Exercise Plan LTR; hamstring stretch, tennis ball  trigger point release ; abdominal breathing;Access Code: [email or web address].Seated Upper Trapezius Stretch - 1 x daily - 7 x weekly - 1 sets - 3 reps - 20 hold.Gentle Levator Scapulae Stretch - 1 x daily - 7 x weekly - 1 sets - 3 reps - 20 hold.Shoulder extension with resistance - Neutral - 1 x daily - 7 x weekly - 3 sets - 10 reps.Scapular Retraction with Resistance - 1 x daily - 7 x weekly - 3 sets - 10 reps    Consulted and Agree with Plan of Care Patient           Patient will benefit from skilled therapeutic intervention in order to improve the following deficits and impairments:  Abnormal gait, Difficulty walking, Decreased range of motion, Decreased mobility, Decreased strength, Postural dysfunction, Pain, Increased muscle spasms  Visit Diagnosis: Chronic bilateral low back pain without sciatica  Muscle weakness (generalized)  Cramp and spasm  Difficulty in walking, not elsewhere classified     Problem List Patient Active Problem List   Diagnosis Date Noted  . Sacroiliitis, not elsewhere classified ([place]) 12/22/2019  . Other secondary scoliosis, lumbar region 12/22/2019  . Arthritis of facet joint of lumbar spine 12/22/2019  . Cervical radiculopathy 10/18/2019  . Numbness 09/21/2019  . Bilateral carpal tunnel syndrome 09/21/2019  . High risk medication use 09/21/2019  . Memory loss 09/21/2019  . Essential hypertension 08/25/2018  . Abnormal SPEP 02/19/2018  . Hand pain 02/20/2017  . Multiple joint pain 02/18/2017  . Right elbow pain 12/09/2016  . Disturbed cognition 06/25/2016  . Trochanteric bursitis of left hip 02/18/2016  . Sciatica, right side 10/30/2015  . Bilateral arm pain 10/10/2015  . Trochanteric bursitis of right hip 08/04/2014  . Adjustment disorder with mixed anxiety and depressed mood 08/04/2014  . Chronic fatigue 08/04/2014  . Urinary frequency 08/04/2014  . Abnormality of gait 08/04/2014  . Unspecified visual disturbance 01/31/2013  . Transient  vision disturbance 01/31/2013  . Routine general medical examination at a health care facility 11/04/2011  . Colon polyps 11/03/2011  . Hot flashes, menopausal 10/13/2011  . POSTHERPETIC NEURALGIA 10/03/2009  . DISPLCMT LUMBAR INTERVERT [place] W/O MYELOPATHY 09/05/2009  . RENAL CALCULUS, RECURRENT 09/04/2009  . Hyperlipidemia 08/31/2009  . MULTIPLE SCLEROSIS, PROGRESSIVE/RELAPSING 08/31/2009  . ALLERGIC RHINITIS 08/31/2009    Carney Living PT DPT  01/09/2020, 1:18 PM  [facility] [address] [place], [place], [address] Phone: [phone]   Fax:  [phone]  Name: KIMBERLIN SCHEEL MRN: [id number] Date of Birth: 1946/05/27

## 2020-01-12 ENCOUNTER — Encounter: Payer: Self-pay | Admitting: Physical Medicine and Rehabilitation

## 2020-01-12 ENCOUNTER — Ambulatory Visit: Payer: Self-pay

## 2020-01-12 ENCOUNTER — Other Ambulatory Visit: Payer: Self-pay

## 2020-01-12 ENCOUNTER — Ambulatory Visit (INDEPENDENT_AMBULATORY_CARE_PROVIDER_SITE_OTHER): Payer: Medicare Other | Admitting: Physical Medicine and Rehabilitation

## 2020-01-12 DIAGNOSIS — M461 Sacroiliitis, not elsewhere classified: Secondary | ICD-10-CM | POA: Diagnosis not present

## 2020-01-12 MED ORDER — METHYLPREDNISOLONE ACETATE 80 MG/ML IJ SUSP
80.0000 mg | INTRAMUSCULAR | Status: AC | PRN
  Administered 2020-01-12: 80 mg via INTRA_ARTICULAR

## 2020-01-12 MED ORDER — BUPIVACAINE HCL 0.5 % IJ SOLN
2.0000 mL | INTRAMUSCULAR | Status: AC | PRN
  Administered 2020-01-12: 2 mL via INTRA_ARTICULAR

## 2020-01-12 NOTE — Progress Notes (Signed)
Dawn [name] - 74 y.o. female MRN [id number]  Date of birth: 1946/04/02  Office Visit Note: Visit Date: 01/12/2020 PCP: Gayland Curry, DO Referred by: Gayland Curry, DO  Subjective: No chief complaint on file.  HPI: Dawn [name] is a 74 y.o. female who comes in today at the request of Dr. Collier Salina Whitfield/Petr[facility]a for planned Left sacroiliac joint injection with fluoroscopic guidance.  The patient has failed conservative care including home exercise, medications, time and activity modification.  This injection will be diagnostic and hopefully therapeutic.  Please see requesting physician notes for further details and justification.   She would like to see me for consultation on her lumbar spine. I am happy to make that appointment. She has had two prior epidural injections at [facility]. She is seen by Dr. Felecia Shelling in neurology.  ROS Otherwise per HPI.  Assessment & Plan: Visit Diagnoses:  1. Sacroiliitis ([place])     Plan: No additional findings.   Meds & Orders: No orders of the defined types were placed in this encounter.   Orders Placed This Encounter  Procedures  . Sacroiliac Joint Inj  . XR C-ARM NO REPORT    Follow-up: Return for visit to requesting physician as needed.   Procedures: Sacroiliac Joint Inj (Left) on 01/12/2020 9:05 AM Indications: pain and diagnostic evaluation Details: 22 G 3.5 in needle, fluoroscopy-guided posterior approach Medications: 2 mL bupivacaine 0.5 %; 80 mg methylPREDNISolone acetate 80 MG/ML Outcome: tolerated well, no immediate complications  There was excellent flow of contrast producing a partial arthrogram of the sacroiliac joint.  Procedure, treatment alternatives, risks and benefits explained, specific risks discussed. Consent was given by the patient. Immediately prior to procedure a time out was called to verify the correct patient, procedure, equipment, support staff and site/side marked as required. Patient was  prepped and draped in the usual sterile fashion.      No notes on file   Clinical History: Acute Interface, Incoming Rad Results - 11/10/2017 2:17 PM EDT  TECHNIQUE: Multiplanar, multisequence MR imaging obtained through the lumbar spine without contrast.   COMPARISON: None.   INDICATION: Lumbago with sciatica, left side   FINDINGS:  Osseous structures: Normal marrow signal. No fracture or vertebral body height loss. In the L1 vertebral body there is a 15 mm lesion that is mildly hypointense the marrow T1/T2 and isointense on STIR. No pars defects. Previous surgery in the lower  lumbar spine. No large bony defects.  Alignment: 1-2 mm retrolisthesis of L2 on L3 and L3 and L4.  Conus medullaris/cauda equina: Normal in appearance.  Paraspinous soft tissues: Unremarkable. Small amount of old surgical scarring/artifact noted at the L4-5 level.  Lower thoracic spine: Unremarkable.    Levels:  T12-L1: No significant stenosis.  L1-L2: No significant stenosis.  L2-L3: Minimal retrolisthesis. Small broad-based posterior disc bulge, ligamentum flavum thickening and trace facet joint fluid. Mild canal stenosis and mild bilateral foraminal stenosis.  L3-L4: Minimal retrolisthesis. Small broad-based posterior disc bulge and annular tear. Ligamentum flavum thickening. Mild canal stenosis. Moderate right and mild left foraminal stenosis.  L4-L5: Small broad-based posterior disc bulge with thickening of the ligamentum flavum. The canal is patent. There is mild foraminal stenosis on the right greater than left.  L5-S1: Small broad-based posterior disc bulge with right foraminal component. Mild facet arthrosis. Mild right foraminal stenosis.    IMPRESSION:  1. Multilevel lumbar disc degenerative change and facet arthrosis as detailed. There is mild canal stenosis at L2-3 and L3-4.  There is moderate right foraminal stenosis at L3-4 with otherwisemild foraminal stenoses.  2. Lesion of the L1  vertebral body. This is most likely a benign hemangioma with atypical signal however strictly cannot exclude other etiology including myeloma. Correlate with pertinent labs and any available prior imaging.Otherwise consider a 6  month follow-up to assure stability.   Electronically Signed by: Tonita Phoenix Specimen Collected: - Date: 11/10/17 Received From: [place]   She reports that she quit smoking about 36 years ago. Her smoking use included cigarettes. She has a 7.50 pack-year smoking history. She has never used smokeless tobacco. No results for input(s): HGBA1C, LABURIC in the last 8760 hours.  Objective:  VS:  HT:    WT:   BMI:     BP:   HR: bpm  TEMP: ( )  RESP:  Physical Exam  Ortho Exam  Imaging: No results found.  Past Medical/Family/Surgical/Social History: Medications & Allergies reviewed per EMR, new medications updated. Patient Active Problem List   Diagnosis Date Noted  . Sacroiliitis, not elsewhere classified ([place]) 12/22/2019  . Other secondary scoliosis, lumbar region 12/22/2019  . Arthritis of facet joint of lumbar spine 12/22/2019  . Cervical radiculopathy 10/18/2019  . Numbness 09/21/2019  . Bilateral carpal tunnel syndrome 09/21/2019  . High risk medication use 09/21/2019  . Memory loss 09/21/2019  . Essential hypertension 08/25/2018  . Abnormal SPEP 02/19/2018  . Hand pain 02/20/2017  . Multiple joint pain 02/18/2017  . Right elbow pain 12/09/2016  . Disturbed cognition 06/25/2016  . Trochanteric bursitis of left hip 02/18/2016  . Sciatica, right side 10/30/2015  . Bilateral arm pain 10/10/2015  . Trochanteric bursitis of right hip 08/04/2014  . Adjustment disorder with mixed anxiety and depressed mood 08/04/2014  . Chronic fatigue 08/04/2014  . Urinary frequency 08/04/2014  . Abnormality of gait 08/04/2014  . Unspecified visual disturbance 01/31/2013  . Transient vision disturbance 01/31/2013  . Routine general medical examination at  a health care facility 11/04/2011  . Colon polyps 11/03/2011  . Hot flashes, menopausal 10/13/2011  . POSTHERPETIC NEURALGIA 10/03/2009  . DISPLCMT LUMBAR INTERVERT [place] W/O MYELOPATHY 09/05/2009  . RENAL CALCULUS, RECURRENT 09/04/2009  . Hyperlipidemia 08/31/2009  . MULTIPLE SCLEROSIS, PROGRESSIVE/RELAPSING 08/31/2009  . ALLERGIC RHINITIS 08/31/2009   Past Medical History:  Diagnosis Date  . Allergy   . Essential hypertension 08/25/2018  . Hot flashes, menopausal 10/13/2011   Estradiol started   . Memory loss 09/21/2019  . Multiple sclerosis ([place])   . Neuropathy   . Osteoarthritis   . Vision abnormalities    Family History  Problem Relation Age of Onset  . Heart failure Mother   . Heart failure Father   . Arthritis Other   . Hypertension Other    Past Surgical History:  Procedure Laterality Date  . ABDOMINAL HYSTERECTOMY  2002   partial  . LUMBAR FUSION     Social History   Occupational History  . Occupation: retired  Tobacco Use  . Smoking status: Former Smoker    Packs/day: 0.50    Years: 15.00    Pack years: 7.50    Types: Cigarettes    Quit date: 08/05/1983    Years since quitting: 36.4  . Smokeless tobacco: Never Used  Vaping Use  . Vaping Use: Never used  Substance and Sexual Activity  . Alcohol use: Yes    Alcohol/week: 0.0 standard drinks    Comment: rarely  . Drug use: No  . Sexual activity: Not Currently  Birth control/protection: Post-menopausal

## 2020-01-12 NOTE — Progress Notes (Signed)
Pt states pain in the lower back on the left side and into the left buttocks. Pt states pain started at the end of May 2021. Pt states any activity makes pain worse. PT sessions and heating pad helps a lot.   .Numeric Pain Rating Scale and Functional Assessment Average Pain 5   In the last MONTH (on 0-10 scale) has pain interfered with the following?  1. General activity like being  able to carry out your everyday physical activities such as walking, climbing stairs, carrying groceries, or moving a chair?  Rating(9)   -Dye Allergies.

## 2020-01-14 ENCOUNTER — Other Ambulatory Visit: Payer: Self-pay | Admitting: Neurology

## 2020-01-16 ENCOUNTER — Encounter: Payer: Self-pay | Admitting: Physical Therapy

## 2020-01-16 ENCOUNTER — Other Ambulatory Visit: Payer: Self-pay

## 2020-01-16 ENCOUNTER — Ambulatory Visit: Payer: Medicare Other | Admitting: Physical Therapy

## 2020-01-16 DIAGNOSIS — R252 Cramp and spasm: Secondary | ICD-10-CM

## 2020-01-16 DIAGNOSIS — M545 Low back pain: Secondary | ICD-10-CM | POA: Diagnosis not present

## 2020-01-16 DIAGNOSIS — G8929 Other chronic pain: Secondary | ICD-10-CM

## 2020-01-16 DIAGNOSIS — R262 Difficulty in walking, not elsewhere classified: Secondary | ICD-10-CM

## 2020-01-16 DIAGNOSIS — M6281 Muscle weakness (generalized): Secondary | ICD-10-CM

## 2020-01-16 DIAGNOSIS — M542 Cervicalgia: Secondary | ICD-10-CM

## 2020-01-16 NOTE — Therapy (Signed)
[place], [place], [address] Phone: [phone]   Fax:  [phone]  Physical Therapy Treatment/Progress note  Patient Details  Name: Dawn [name] MRN: [id number] Date of Birth: 08-08-1945 Referring Provider (PT): Dr Christell Constant  Progress Note Reporting Period 10/27/2019 to 01/16/2020  See note below for Objective Data and Assessment of Progress/Goals.       Encounter Date: 01/16/2020   PT End of Session - 01/16/20 1022    Visit Number 30    Number of Visits 38    Date for PT Re-Evaluation 03/12/20    Authorization Type Medicare Kx Modifier on visit 13 proress not on 30th visit    PT Start Time 1018    PT Stop Time 1100    PT Time Calculation (min) 42 min    Activity Tolerance Patient tolerated treatment well    Behavior During Therapy WFL for tasks assessed/performed           Past Medical History:  Diagnosis Date  . Allergy   . Essential hypertension 08/25/2018  . Hot flashes, menopausal 10/13/2011   Estradiol started   . Memory loss 09/21/2019  . Multiple sclerosis ([place])   . Neuropathy   . Osteoarthritis   . Vision abnormalities     Past Surgical History:  Procedure Laterality Date  . ABDOMINAL HYSTERECTOMY  2002   partial  . LUMBAR FUSION      There were no vitals filed for this visit.   Subjective Assessment - 01/16/20 1023    Subjective Patient reports she is feeling a little better today. She is having most of her pain in the lower back.    Currently in Pain? Yes    Pain Score 3    Pain Location Back              [facility] PT Assessment - 01/16/20 0001      AROM   Cervical Flexion 36    Cervical Extension 20    Cervical - Right Rotation 56    Cervical - Left Rotation 75      Strength   Right Shoulder Flexion 4/5    Right Shoulder Internal Rotation 4/5    Right Shoulder External Rotation 4/5    Left Shoulder Flexion 4/5    Left Shoulder External Rotation 4/5     Left Shoulder Horizontal ABduction 4/5    Left Hip ABduction 4+/5    Left Hip ADduction 4+/5    Right Knee Flexion 4+/5    Right Knee Extension 4+/5    Left Knee Flexion 4+/5    Left Knee Extension 4+/5           40% on ABC confidence scale                [facility] Adult PT Treatment/Exercise - 01/16/20 0001      High Level Balance   High Level Balance Comments bnarrow base eo and ec 2x30 sec each, tandem stance each leg 32x30 sec eo and ec min gaurd for all eyes closed activity but no loss of balance noted.       Self-Care   Other Self-Care Comments  reviewed set up and use of TENS unit. SDiscussed treatment goals and when to use. Showed patient potential pad placements       Manual Therapy   Manual Therapy Passive ROM;Manual Traction    Manual therapy comments skilled palpation of trigger points     Joint Mobilization PA  glides from L2 to L5     Soft tissue mobilization to lumbar spine and upper gluteals bilateral ; trigger point release to bilateral upper traps     Manual Traction gentle manual traction to cervical spine.                     PT Short Term Goals - 01/16/20 1121      PT SHORT TERM GOAL #1   Title Patient will report no pain radiating into anterior thigh    Baseline ino radiucalr pain at this time    Time 4    Period Weeks    Status Achieved    Target Date 12/09/19      PT SHORT TERM GOAL #2   Title Patient will increase bilateral LE strength to 4/5    Baseline 4+/5 gross    Period Weeks    Status Achieved    Target Date 05/24/19      PT SHORT TERM GOAL #3   Title Patient will increase passive right hip flexion to 105 degrrees    Baseline 100 today with pain onthe right side    Time 4    Period Weeks    Status On-going    Target Date 02/13/20      PT SHORT TERM GOAL #4   Title Patient will increase right cervical rotation by 10 degrees    Baseline to 58 degres today    Time 4    Period Weeks    Status On-going      PT  SHORT TERM GOAL #5   Title Patient will increase ross bilateral UE strength to 4+/5    Baseline improved to 4/5    Status Revised    Target Date 02/13/20             PT Long Term Goals - 01/16/20 1123      PT LONG TERM GOAL #1   Title Patient will stand for 30 min without self reported increase pain in order to stand at a kitchen sink.    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     Baseline pain at edn range hip flexion and ir but intensity decreased    Time 6    Period Weeks    Status On-going    Target Date 02/27/20      PT LONG TERM GOAL #4   Title Patient will reach behind his head without pain in order to perfrom ADL's    Baseline mild pain and tightness but improved    Time 6    Period Weeks    Status On-going      PT LONG TERM GOAL #5   Title Patient will report no radicular pain into her hands    Baseline more intermittent    Time 6    Period Weeks    Status On-going      PT LONG  TERM GOAL #6   Title POatient will improve ABC score to 50% function in order to decrease her risk of fall    Baseline 40%    Time 6    Period Weeks    Status On-going                 Plan - 01/16/20 1116    Clinical Impression Statement Patient is making progress. All cervical motions have improved. Her cervical flexion has improved to 38 degrees without pain. Her cervical rotation to the right has improved 8 degrees. Bilateral gross shoulder strength improved to 4/10. He ABC (balance cofidence scale has improved to 40% She is still a fall risk and would benefit from futher skilled  therapy. She continues to have radicular pain and numbness into both hands but has had decreased frequncy and intensity ofver the past 2-3 weeks. Patient would benefit from furhter skilled therapy to continue to improve balance, decrease musculo skeletal pain, and continue to work on a home program. During current reporting period she had an exacerbation of loewer back pain that slowed her progress but that has resolved. She was able to work on manual therapy today. Therapy also educated her on how to use her tens unit.    Personal Factors and Comorbidities Comorbidity 1;Comorbidity 2    Comorbidities MS, OA    Examination-Activity Limitations Locomotion Level;Carry;Stand;Lift;Stairs;Squat    Examination-Participation Restrictions Meal Prep;Cleaning;Community Activity;Laundry;Shop    Stability/Clinical Decision Making Evolving/Moderate complexity    Clinical Decision Making Moderate    Rehab Potential Good    PT Frequency 1x / week    PT Duration 6 weeks    PT Treatment/Interventions ADLs/Self Care Home Management;Electrical Stimulation;Cryotherapy;Iontophoresis 4mg /ml Dexamethasone;Moist Heat;Traction;DME Instruction;Neuromuscular re-education;Patient/family education;Manual techniques;Passive range of motion;Taping    PT Next Visit Plan work on balance with patient,    PT Home Exercise Plan LTR; hamstring stretch, tennis ball trigger point release ; abdominal breathing;Access Code: [email or web address].Seated Upper Trapezius Stretch - 1 x daily - 7 x weekly - 1 sets - 3 reps - 20 hold.Gentle Levator Scapulae Stretch - 1 x daily - 7 x weekly - 1 sets - 3 reps - 20 hold.Shoulder extension with resistance - Neutral - 1 x daily - 7 x weekly - 3 sets - 10 reps.Scapular Retraction with Resistance - 1 x daily - 7 x weekly - 3 sets - 10 reps    Consulted and Agree with Plan of Care Patient           Patient will benefit from skilled therapeutic intervention in order to improve the following deficits and  impairments:  Abnormal gait, Difficulty walking, Decreased range of motion, Decreased mobility, Decreased strength, Postural dysfunction, Pain, Increased muscle spasms  Visit Diagnosis: Chronic bilateral low back pain without sciatica  Muscle weakness (generalized)  Cramp and spasm  Difficulty in walking, not elsewhere classified  Cervicalgia     Problem List Patient Active Problem List   Diagnosis Date Noted  . Sacroiliitis, not elsewhere classified ([place]) 12/22/2019  . Other secondary scoliosis, lumbar region 12/22/2019  . Arthritis of facet joint of lumbar spine 12/22/2019  . Cervical radiculopathy 10/18/2019  . Numbness 09/21/2019  . Bilateral carpal tunnel syndrome 09/21/2019  . High risk medication use 09/21/2019  . Memory loss 09/21/2019  . Essential hypertension 08/25/2018  . Abnormal SPEP 02/19/2018  . Hand pain 02/20/2017  . Multiple joint pain 02/18/2017  . Right elbow pain 12/09/2016  . Disturbed cognition 06/25/2016  . Trochanteric bursitis of left  hip 02/18/2016  . Sciatica, right side 10/30/2015  . Bilateral arm pain 10/10/2015  . Trochanteric bursitis of right hip 08/04/2014  . Adjustment disorder with mixed anxiety and depressed mood 08/04/2014  . Chronic fatigue 08/04/2014  . Urinary frequency 08/04/2014  . Abnormality of gait 08/04/2014  . Unspecified visual disturbance 01/31/2013  . Transient vision disturbance 01/31/2013  . Routine general medical examination at a health care facility 11/04/2011  . Colon polyps 11/03/2011  . Hot flashes, menopausal 10/13/2011  . POSTHERPETIC NEURALGIA 10/03/2009  . DISPLCMT LUMBAR INTERVERT [place] W/O MYELOPATHY 09/05/2009  . RENAL CALCULUS, RECURRENT 09/04/2009  . Hyperlipidemia 08/31/2009  . MULTIPLE SCLEROSIS, PROGRESSIVE/RELAPSING 08/31/2009  . ALLERGIC RHINITIS 08/31/2009    Carney Living PT DPT  01/16/2020, 11:37 AM  [facility] [address] [place], [place], [address] Phone: [phone]   Fax:  [phone]  Name: Dawn [name] MRN: [id number] Date of Birth: January 25, 1946

## 2020-01-18 ENCOUNTER — Ambulatory Visit (INDEPENDENT_AMBULATORY_CARE_PROVIDER_SITE_OTHER): Payer: Medicare Other | Admitting: Physical Medicine and Rehabilitation

## 2020-01-18 ENCOUNTER — Other Ambulatory Visit: Payer: Self-pay

## 2020-01-18 ENCOUNTER — Encounter: Payer: Self-pay | Admitting: Physical Medicine and Rehabilitation

## 2020-01-18 VITALS — BP 166/84 | HR 67 | Ht 62.0 in | Wt 185.0 lb

## 2020-01-18 DIAGNOSIS — R202 Paresthesia of skin: Secondary | ICD-10-CM

## 2020-01-18 DIAGNOSIS — M47816 Spondylosis without myelopathy or radiculopathy, lumbar region: Secondary | ICD-10-CM

## 2020-01-18 DIAGNOSIS — M7061 Trochanteric bursitis, right hip: Secondary | ICD-10-CM

## 2020-01-18 DIAGNOSIS — G35 Multiple sclerosis: Secondary | ICD-10-CM | POA: Diagnosis not present

## 2020-01-18 DIAGNOSIS — G8929 Other chronic pain: Secondary | ICD-10-CM | POA: Diagnosis not present

## 2020-01-18 DIAGNOSIS — M545 Low back pain: Secondary | ICD-10-CM

## 2020-01-18 DIAGNOSIS — M7062 Trochanteric bursitis, left hip: Secondary | ICD-10-CM

## 2020-01-18 NOTE — Progress Notes (Signed)
Pt states pain in the lower back and into the right buttocks with numbness in both feet. Pt states last injection 01/12/20 is helping. Any physical activities makes pain worse. Heating pad, stretches, and ice packs helps with pain. Pt also states PT sessions is helping.   .Numeric Pain Rating Scale and Functional Assessment Average Pain 7 Pain Right Now 2 My pain is constant, sharp, dull, tingling and aching Pain is worse with: some activites Pain improves with: heat/ice, therapy/exercise and TENS   In the last MONTH (on 0-10 scale) has pain interfered with the following?  1. General activity like being  able to carry out your everyday physical activities such as walking, climbing stairs, carrying groceries, or moving a chair?  Rating(7)  2. Relation with others like being able to carry out your usual social activities and roles such as  activities at home, at work and in your community. Rating(7)  3. Enjoyment of life such that you have  been bothered by emotional problems such as feeling anxious, depressed or irritable?  Rating(4)

## 2020-01-19 ENCOUNTER — Ambulatory Visit (INDEPENDENT_AMBULATORY_CARE_PROVIDER_SITE_OTHER): Payer: Medicare Other | Admitting: Physical Medicine and Rehabilitation

## 2020-01-19 ENCOUNTER — Ambulatory Visit: Payer: Self-pay

## 2020-01-19 DIAGNOSIS — M47816 Spondylosis without myelopathy or radiculopathy, lumbar region: Secondary | ICD-10-CM

## 2020-01-19 MED ORDER — METHYLPREDNISOLONE ACETATE 80 MG/ML IJ SUSP
80.0000 mg | Freq: Once | INTRAMUSCULAR | Status: AC
  Administered 2020-01-19: 80 mg

## 2020-01-19 NOTE — Progress Notes (Signed)
Pt states no major changes since yesterday visit.   .Numeric Pain Rating Scale and Functional Assessment Average Pain 4   In the last MONTH (on 0-10 scale) has pain interfered with the following?  1. General activity like being  able to carry out your everyday physical activities such as walking, climbing stairs, carrying groceries, or moving a chair?  Rating(5)   +Driver, -BT, -Dye Allergies.

## 2020-01-19 NOTE — Progress Notes (Signed)
Dawn [name] - 74 y.o. female MRN [id number]  Date of birth: [birth year]  Office Visit Note: Visit Date: 01/18/2020 PCP: Gayland Curry, DO Referred by: Gayland Curry, DO  Subjective: Chief Complaint  Patient presents with  . Lower Back - Pain  . Right Foot - Numbness  . Left Foot - Numbness   HPI: Dawn [name] is a 74 y.o. female who comes in today For evaluation management of chronic history of recalcitrant low back pain mostly axial with some buttock pain on the right.  Her history is complicated by relapsing and remitting multiple sclerosis.  She also endorses tingling in numbness in the feet bilaterally.  She is followed from a neurology standpoint by Dr. Arlice Colt.  From an orthopedic standpoint she is managed by Dr. Joni Fears in our office.  He had originally sent her to me for right sacroiliac joint injection fluoroscopic guidance which did seem to reduce her SI joint type pain or buttock pain by at least 50% and she is pretty happy with that injection.  When I met her at that time she was complaining of a lot of low back pain that just was not getting better.  She has had prior epidural injection by [facility] imaging and remotely by Dr. Felecia Shelling.  The last 2 injections were L3-4 interlaminar epidural steroid injections.  We reviewed the images and notes.  I did show the patient those images.  They seem to be fairly well-placed.  She said the first injection did seem to help but the second injection did not do much at all.  She has MRI from 2019 that we do have the report.  There is no high-grade lumbar stenosis no nerve compression no real large disc herniation.  She does have more current x-ray of the lumbar spine and this shows pretty significant facet arthropathy particular on the right at L5-S1 with mild degenerative scoliosis from that level upward.  She reports that the tingling in the feet seems to have occurred after a exacerbation of her multiple sclerosis  sometime ago.  She says Dr. Felecia Shelling feels like the tingling numbness is from the multiple sclerosis.  She has multiple other comorbidities and medical conditions making the case fairly complex in general but without really a very complex spine per se.  She has been started on some tramadol which she does take at night and she takes only half a tablet but this does seem to help her sleep.  She has been having ongoing physical therapy here in our office by Carolyne Littles.  She feels like the physical therapy has been very beneficial for her.  It has it has not relieved her back pain.  Review of Systems  Musculoskeletal: Positive for back pain and joint pain.  Neurological: Positive for tingling. Negative for focal weakness.  All other systems reviewed and are negative.  Otherwise per HPI.  Assessment & Plan: Visit Diagnoses:  1. Chronic bilateral low back pain without sciatica   2. Spondylosis without myelopathy or radiculopathy, lumbar region   3. Paresthesia of skin   4. MULTIPLE SCLEROSIS, PROGRESSIVE/RELAPSING   5. Greater trochanteric bursitis, left   6. Greater trochanteric bursitis, right     Plan: Findings:  Chronic worsening severe right more than left bilateral low back pain some referral into the buttock region but no radicular pain down the legs.  Paresthesias likely from lesion from multiple sclerosis.  No real spine source noted for giving her paresthesias in the  feet.  I have asked her to try to obtain the MRI on CD so that we can actually look at it that we do have the report.  Epidural injections were really less than helpful although one may have helped.  I think her pain is really facet mediated pain of the lower spine particularly L5-S1.  We did complete diagnostic blocks at L5-S1.  If she does well with that would look at double block paradigm and radiofrequency ablation.  We discussed this at length with her today we will get her scheduled for that injection.  She will continue  with tramadol.  Bilateral greater trochanteric bursitis.  This is likely developed from difficulty ambulating at times and change in gait for multiple sclerosis.  At some point would look at fluoroscopically guided greater trochanteric injection.  She has had these done blindly in the past with some relief.  Her biggest complaint is still the lower back.    Meds & Orders: No orders of the defined types were placed in this encounter.  No orders of the defined types were placed in this encounter.   Follow-up: Return for Bilateral L5-S1 facet joint block.   Procedures: No procedures performed  No notes on file   Clinical History: Acute Interface, Incoming Rad Results - 11/10/2017 2:17 PM EDT  TECHNIQUE: Multiplanar, multisequence MR imaging obtained through the lumbar spine without contrast.   COMPARISON: None.   INDICATION: Lumbago with sciatica, left side   FINDINGS:  Osseous structures: Normal marrow signal. No fracture or vertebral body height loss. In the L1 vertebral body there is a 15 mm lesion that is mildly hypointense the marrow T1/T2 and isointense on STIR. No pars defects. Previous surgery in the lower  lumbar spine. No large bony defects.  Alignment: 1-2 mm retrolisthesis of L2 on L3 and L3 and L4.  Conus medullaris/cauda equina: Normal in appearance.  Paraspinous soft tissues: Unremarkable. Small amount of old surgical scarring/artifact noted at the L4-5 level.  Lower thoracic spine: Unremarkable.    Levels:  T12-L1: No significant stenosis.  L1-L2: No significant stenosis.  L2-L3: Minimal retrolisthesis. Small broad-based posterior disc bulge, ligamentum flavum thickening and trace facet joint fluid. Mild canal stenosis and mild bilateral foraminal stenosis.  L3-L4: Minimal retrolisthesis. Small broad-based posterior disc bulge and annular tear. Ligamentum flavum thickening. Mild canal stenosis. Moderate right and mild left foraminal stenosis.  L4-L5: Small  broad-based posterior disc bulge with thickening of the ligamentum flavum. The canal is patent. There is mild foraminal stenosis on the right greater than left.  L5-S1: Small broad-based posterior disc bulge with right foraminal component. Mild facet arthrosis. Mild right foraminal stenosis.    IMPRESSION:  1. Multilevel lumbar disc degenerative change and facet arthrosis as detailed. There is mild canal stenosis at L2-3 and L3-4. There is moderate right foraminal stenosis at L3-4 with otherwisemild foraminal stenoses.  2. Lesion of the L1 vertebral body. This is most likely a benign hemangioma with atypical signal however strictly cannot exclude other etiology including myeloma. Correlate with pertinent labs and any available prior imaging.Otherwise consider a 6  month follow-up to assure stability.   Electronically Signed by: Tonita Phoenix Specimen Collected: - Date: 11/10/17 Received From: [place]   She reports that she quit smoking about 36 years ago. Her smoking use included cigarettes. She has a 7.50 pack-year smoking history. She has never used smokeless tobacco. No results for input(s): HGBA1C, LABURIC in the last 8760 hours.  Objective:  VS:  HT:5'  2" (157.5 cm)   WT:185 lb (83.9 kg)  BMI:33.83    BP:(!) 166/84  HR:67bpm  TEMP: ( )  RESP:  Physical Exam Vitals and nursing note reviewed.  Constitutional:      General: She is not in acute distress.    Appearance: Normal appearance. She is well-developed. She is obese. She is not ill-appearing.  HENT:     Head: Normocephalic and atraumatic.  Eyes:     Conjunctiva/sclera: Conjunctivae normal.     Pupils: Pupils are equal, round, and reactive to light.  Cardiovascular:     Rate and Rhythm: Normal rate.     Pulses: Normal pulses.  Pulmonary:     Effort: Pulmonary effort is normal.  Musculoskeletal:     Right lower leg: No edema.     Left lower leg: No edema.     Comments: Patient slow to rise from a seated  position to full extension.  She does have concordant pain with facet loading of the lower back.  She does have a positive Fortin finger sign on the right.  Equivocal Patrick's on the right.  She does have pain over the greater trochanters consistent with a tendinitis bursitis.  This does not reproduce her normal back pain however.  She has good distal strength.  Skin:    General: Skin is warm and dry.     Findings: No erythema or rash.  Neurological:     General: No focal deficit present.     Mental Status: She is alert and oriented to person, place, and time.     Sensory: No sensory deficit.     Motor: No abnormal muscle tone.     Coordination: Coordination normal.     Gait: Gait normal.  Psychiatric:        Mood and Affect: Mood normal.        Behavior: Behavior normal.     Ortho Exam  Imaging: No results found.  Past Medical/Family/Surgical/Social History: Medications & Allergies reviewed per EMR, new medications updated. Patient Active Problem List   Diagnosis Date Noted  . Sacroiliitis, not elsewhere classified ([place]) 12/22/2019  . Other secondary scoliosis, lumbar region 12/22/2019  . Arthritis of facet joint of lumbar spine 12/22/2019  . Cervical radiculopathy 10/18/2019  . Numbness 09/21/2019  . Bilateral carpal tunnel syndrome 09/21/2019  . High risk medication use 09/21/2019  . Memory loss 09/21/2019  . Essential hypertension 08/25/2018  . Abnormal SPEP 02/19/2018  . Hand pain 02/20/2017  . Multiple joint pain 02/18/2017  . Right elbow pain 12/09/2016  . Disturbed cognition 06/25/2016  . Trochanteric bursitis of left hip 02/18/2016  . Sciatica, right side 10/30/2015  . Bilateral arm pain 10/10/2015  . Trochanteric bursitis of right hip 08/04/2014  . Adjustment disorder with mixed anxiety and depressed mood 08/04/2014  . Chronic fatigue 08/04/2014  . Urinary frequency 08/04/2014  . Abnormality of gait 08/04/2014  . Unspecified visual disturbance 01/31/2013  .  Transient vision disturbance 01/31/2013  . Routine general medical examination at a health care facility 11/04/2011  . Colon polyps 11/03/2011  . Hot flashes, menopausal 10/13/2011  . POSTHERPETIC NEURALGIA 10/03/2009  . DISPLCMT LUMBAR INTERVERT [place] W/O MYELOPATHY 09/05/2009  . RENAL CALCULUS, RECURRENT 09/04/2009  . Hyperlipidemia 08/31/2009  . MULTIPLE SCLEROSIS, PROGRESSIVE/RELAPSING 08/31/2009  . ALLERGIC RHINITIS 08/31/2009   Past Medical History:  Diagnosis Date  . Allergy   . Essential hypertension 08/25/2018  . Hot flashes, menopausal 10/13/2011   Estradiol started   . Memory loss  09/21/2019  . Multiple sclerosis ([place])   . Neuropathy   . Osteoarthritis   . Vision abnormalities    Family History  Problem Relation Age of Onset  . Heart failure Mother   . Heart failure Father   . Arthritis Other   . Hypertension Other    Past Surgical History:  Procedure Laterality Date  . ABDOMINAL HYSTERECTOMY  2002   partial  . LUMBAR FUSION     Social History   Occupational History  . Occupation: retired  Tobacco Use  . Smoking status: Former Smoker    Packs/day: 0.50    Years: 15.00    Pack years: 7.50    Types: Cigarettes    Quit date: 08/05/1983    Years since quitting: 36.4  . Smokeless tobacco: Never Used  Vaping Use  . Vaping Use: Never used  Substance and Sexual Activity  . Alcohol use: Yes    Alcohol/week: 0.0 standard drinks    Comment: rarely  . Drug use: No  . Sexual activity: Not Currently    Birth control/protection: Post-menopausal

## 2020-01-20 NOTE — Progress Notes (Signed)
Dawn [name] - 74 y.o. female MRN [id number]  Date of birth: 03-05-1946  Office Visit Note: Visit Date: 01/19/2020 PCP: Gayland Curry, DO Referred by: Gayland Curry, DO  Subjective: No chief complaint on file.  HPI:  Dawn [name] is a 74 y.o. female who comes in today at the request of Dr. Laurence Spates for planned Bilateral L5-S1 Lumbar facet/medial branch block with fluoroscopic guidance.  The patient has failed conservative care including home exercise, medications, time and activity modification.  This injection will be diagnostic and hopefully therapeutic.  Please see requesting physician notes for further details and justification.  Exam shows concordant low back pain with facet joint loading and extension.  ROS Otherwise per HPI.  Assessment & Plan: Visit Diagnoses:  1. Spondylosis without myelopathy or radiculopathy, lumbar region     Plan: No additional findings.   Meds & Orders:  Meds ordered this encounter  Medications  . methylPREDNISolone acetate (DEPO-MEDROL) injection 80 mg    Orders Placed This Encounter  Procedures  . Facet Injection  . XR C-ARM NO REPORT    Follow-up: Return if symptoms worsen or fail to improve.   Procedures: No procedures performed  Lumbar Facet Joint Intra-Articular Injection(s) with Fluoroscopic Guidance  Patient: Dawn [name]      Date of Birth: [birth year] MRN: [id number] PCP: Gayland Curry, DO      Visit Date: 01/19/2020   Universal Protocol:    Date/Time: 01/19/2020  Consent Given By: the patient  Position: PRONE   Additional Comments: Vital signs were monitored before and after the procedure. Patient was prepped and draped in the usual sterile fashion. The correct patient, procedure, and site was verified.   Injection Procedure Details:  Procedure Site One Meds Administered:  Meds ordered this encounter  Medications  . methylPREDNISolone acetate (DEPO-MEDROL) injection 80 mg     Laterality:  Bilateral  Location/Site:  L5-S1  Needle size: 22 guage  Needle type: Spinal  Needle Placement: Articular  Findings:  -Comments: Excellent flow of contrast producing a partial arthrogram.  Procedure Details: The fluoroscope beam is vertically oriented in AP, and the inferior recess is visualized beneath the lower pole of the inferior apophyseal process, which represents the target point for needle insertion. When direct visualization is difficult the target point is located at the medial projection of the vertebral pedicle. The region overlying each aforementioned target is locally anesthetized with a 1 to 2 ml. volume of 1% Lidocaine without Epinephrine.   The spinal needle was inserted into each of the above mentioned facet joints using biplanar fluoroscopic guidance. A 0.25 to 0.5 ml. volume of Isovue-250 was injected and a partial facet joint arthrogram was obtained. A single spot film was obtained of the resulting arthrogram.    One to 1.25 ml of the steroid/anesthetic solution was then injected into each of the facet joints noted above.   Additional Comments:  The patient tolerated the procedure well Dressing: 2 x 2 sterile gauze and Band-Aid    Post-procedure details: Patient was observed during the procedure. Post-procedure instructions were reviewed.  Patient left the clinic in stable condition.     Clinical History: Acute Interface, Incoming Rad Results - 11/10/2017 2:17 PM EDT  TECHNIQUE: Multiplanar, multisequence MR imaging obtained through the lumbar spine without contrast.   COMPARISON: None.   INDICATION: Lumbago with sciatica, left side   FINDINGS:  Osseous structures: Normal marrow signal. No fracture or vertebral body height loss. In the L1 vertebral  body there is a 15 mm lesion that is mildly hypointense the marrow T1/T2 and isointense on STIR. No pars defects. Previous surgery in the lower  lumbar spine. No large bony defects.  Alignment: 1-2 mm  retrolisthesis of L2 on L3 and L3 and L4.  Conus medullaris/cauda equina: Normal in appearance.  Paraspinous soft tissues: Unremarkable. Small amount of old surgical scarring/artifact noted at the L4-5 level.  Lower thoracic spine: Unremarkable.    Levels:  T12-L1: No significant stenosis.  L1-L2: No significant stenosis.  L2-L3: Minimal retrolisthesis. Small broad-based posterior disc bulge, ligamentum flavum thickening and trace facet joint fluid. Mild canal stenosis and mild bilateral foraminal stenosis.  L3-L4: Minimal retrolisthesis. Small broad-based posterior disc bulge and annular tear. Ligamentum flavum thickening. Mild canal stenosis. Moderate right and mild left foraminal stenosis.  L4-L5: Small broad-based posterior disc bulge with thickening of the ligamentum flavum. The canal is patent. There is mild foraminal stenosis on the right greater than left.  L5-S1: Small broad-based posterior disc bulge with right foraminal component. Mild facet arthrosis. Mild right foraminal stenosis.    IMPRESSION:  1. Multilevel lumbar disc degenerative change and facet arthrosis as detailed. There is mild canal stenosis at L2-3 and L3-4. There is moderate right foraminal stenosis at L3-4 with otherwisemild foraminal stenoses.  2. Lesion of the L1 vertebral body. This is most likely a benign hemangioma with atypical signal however strictly cannot exclude other etiology including myeloma. Correlate with pertinent labs and any available prior imaging.Otherwise consider a 6  month follow-up to assure stability.   Electronically Signed by: Tonita Phoenix Specimen Collected: - Date: 11/10/17 Received From: [facility]     Objective:  VS:  HT:    WT:   BMI:     BP:   HR: bpm  TEMP: ( )  RESP:  Physical Exam   Imaging: XR C-ARM NO REPORT  Result Date: 01/19/2020 Please see Notes tab for imaging impression.

## 2020-01-20 NOTE — Procedures (Signed)
Lumbar Facet Joint Intra-Articular Injection(s) with Fluoroscopic Guidance  Patient: Dawn [name]      Date of Birth: Feb 10, 1946 MRN: [id number] PCP: Gayland Curry, DO      Visit Date: 01/19/2020   Universal Protocol:    Date/Time: 01/19/2020  Consent Given By: the patient  Position: PRONE   Additional Comments: Vital signs were monitored before and after the procedure. Patient was prepped and draped in the usual sterile fashion. The correct patient, procedure, and site was verified.   Injection Procedure Details:  Procedure Site One Meds Administered:  Meds ordered this encounter  Medications  . methylPREDNISolone acetate (DEPO-MEDROL) injection 80 mg     Laterality: Bilateral  Location/Site:  L5-S1  Needle size: 22 guage  Needle type: Spinal  Needle Placement: Articular  Findings:  -Comments: Excellent flow of contrast producing a partial arthrogram.  Procedure Details: The fluoroscope beam is vertically oriented in AP, and the inferior recess is visualized beneath the lower pole of the inferior apophyseal process, which represents the target point for needle insertion. When direct visualization is difficult the target point is located at the medial projection of the vertebral pedicle. The region overlying each aforementioned target is locally anesthetized with a 1 to 2 ml. volume of 1% Lidocaine without Epinephrine.   The spinal needle was inserted into each of the above mentioned facet joints using biplanar fluoroscopic guidance. A 0.25 to 0.5 ml. volume of Isovue-250 was injected and a partial facet joint arthrogram was obtained. A single spot film was obtained of the resulting arthrogram.    One to 1.25 ml of the steroid/anesthetic solution was then injected into each of the facet joints noted above.   Additional Comments:  The patient tolerated the procedure well Dressing: 2 x 2 sterile gauze and Band-Aid    Post-procedure details: Patient was  observed during the procedure. Post-procedure instructions were reviewed.  Patient left the clinic in stable condition.

## 2020-01-25 ENCOUNTER — Ambulatory Visit: Payer: Medicare Other | Attending: Neurology | Admitting: Physical Therapy

## 2020-01-25 ENCOUNTER — Encounter: Payer: Self-pay | Admitting: Physical Therapy

## 2020-01-25 ENCOUNTER — Other Ambulatory Visit: Payer: Self-pay

## 2020-01-25 DIAGNOSIS — M6281 Muscle weakness (generalized): Secondary | ICD-10-CM | POA: Diagnosis not present

## 2020-01-25 DIAGNOSIS — G8929 Other chronic pain: Secondary | ICD-10-CM

## 2020-01-25 DIAGNOSIS — R262 Difficulty in walking, not elsewhere classified: Secondary | ICD-10-CM

## 2020-01-25 DIAGNOSIS — M545 Low back pain: Secondary | ICD-10-CM | POA: Diagnosis not present

## 2020-01-25 DIAGNOSIS — R252 Cramp and spasm: Secondary | ICD-10-CM

## 2020-01-25 DIAGNOSIS — M542 Cervicalgia: Secondary | ICD-10-CM

## 2020-01-25 NOTE — Therapy (Addendum)
[place], [place], [address] Phone: [phone]   Fax:  [phone]  Physical Therapy Treatment  Patient Details  Name: Dawn [name] MRN: [id number] Date of Birth: 12-20-1945 Referring Provider (PT): Dr Christell Constant   Encounter Date: 01/25/2020   PT End of Session - 01/25/20 1024    Visit Number 31    Number of Visits 38    Date for PT Re-Evaluation 03/12/20    Authorization Type Medicare Kx Modifier on visit 13 proress not on 30th visit    PT Start Time 1018    PT Stop Time 1057    PT Time Calculation (min) 39 min    Equipment Utilized During Treatment Gait belt    Activity Tolerance Patient tolerated treatment well    Behavior During Therapy WFL for tasks assessed/performed           Past Medical History:  Diagnosis Date  . Allergy   . Essential hypertension 08/25/2018  . Hot flashes, menopausal 10/13/2011   Estradiol started   . Memory loss 09/21/2019  . Multiple sclerosis ([place])   . Neuropathy   . Osteoarthritis   . Vision abnormalities     Past Surgical History:  Procedure Laterality Date  . ABDOMINAL HYSTERECTOMY  2002   partial  . LUMBAR FUSION      There were no vitals filed for this visit.   Subjective Assessment - 01/25/20 1022    Subjective Patient reports she is feeling good today. She had a cortisone shot last week in the back and the hip.    Pertinent History MS    How long can you stand comfortably? hurts as soon as she stands. Has to shift frequently    How long can you walk comfortably? limited community ambualtion    Diagnostic tests Nothign recent    Currently in Pain? Yes    Pain Score 2     Pain Location Hip    Pain Orientation Right    Pain Descriptors / Indicators Aching    Pain Type Chronic pain    Pain Onset More than a month ago    Pain Frequency Constant    Aggravating Factors  standing and walking    Pain Relieving Factors rest and stretching     Effect of Pain on Daily Activities difficulty walking distances    Multiple Pain Sites No                             [facility] Adult PT Treatment/Exercise - 01/25/20 0001      High Level Balance   High Level Balance Comments narrow base eyes open 2x30, eyes closed 3x10 sec each, tandem stance each leg 2x30 sec eyes open, 2x10 sec eyes closed each leg; min gaurd for all eyes closed activity       Manual Therapy   Manual Therapy Passive ROM;Manual Traction    Manual therapy comments skilled palpation of trigger points     Joint Mobilization PA glides from L2 to L5     Soft tissue mobilization to lumbar spine and upper gluteals bilateral ; trigger point release to bilateral upper traps     Manual Traction LAD Grade III-IV            Trigger Point Dry Needling - 01/25/20 0001    Consent Given? Yes    Dry Needling Comments 3 needles in gluteals right 30x75 needle  Gluteus Medius Response Twitch response elicited                  PT Short Term Goals - 01/16/20 1121      PT SHORT TERM GOAL #1   Title Patient will report no pain radiating into anterior thigh    Baseline ino radiucalr pain at this time    Time 4    Period Weeks    Status Achieved    Target Date 12/09/19      PT SHORT TERM GOAL #2   Title Patient will increase bilateral LE strength to 4/5    Baseline 4+/5 gross    Period Weeks    Status Achieved    Target Date 05/24/19      PT SHORT TERM GOAL #3   Title Patient will increase passive right hip flexion to 105 degrrees    Baseline 100 today with pain onthe right side    Time 4    Period Weeks    Status On-going    Target Date 02/13/20      PT SHORT TERM GOAL #4   Title Patient will increase right cervical rotation by 10 degrees    Baseline to 58 degres today    Time 4    Period Weeks    Status On-going      PT SHORT TERM GOAL #5   Title Patient will increase ross bilateral UE strength to 4+/5    Baseline improved to 4/5     Status Revised    Target Date 02/13/20             PT Long Term Goals - 01/16/20 1123      PT LONG TERM GOAL #1   Title Patient will stand for 30 min without self reported increase pain in order to stand at a kitchen sink.    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     Baseline pain at edn range hip flexion and ir but intensity decreased    Time 6    Period Weeks    Status On-going    Target Date 02/27/20      PT LONG TERM GOAL #4   Title Patient will reach behind his head without pain in order to perfrom ADL's    Baseline mild pain and tightness but improved    Time 6    Period Weeks    Status On-going      PT LONG TERM GOAL #5   Title Patient will report no radicular pain into her hands    Baseline more intermittent    Time 6    Period Weeks    Status On-going      PT LONG TERM GOAL #6   Title POatient will improve ABC score to 50% function in order to decrease her risk of fall    Baseline 40%    Time 6    Period  Weeks    Status On-going                 Plan - 01/25/20 1345    Clinical Impression Statement Patient continues to make progress. Therapy did some STW on the glutes and lower back to release trigger points. Dry needling was done to help relieve these trigger points and decrease pain. Therapy worked on narrow base and semi-tandem base of support on the air-ex with eyes open, and her balance is improving. Patient continues to have pain but states it is better after having a cortisone shot and doing therapy.    Personal Factors and Comorbidities Comorbidity  1;Comorbidity 2    Comorbidities MS, OA    Examination-Activity Limitations Locomotion Level;Carry;Stand;Lift;Stairs;Squat    Examination-Participation Restrictions Meal Prep;Cleaning;Community Activity;Laundry;Shop    Stability/Clinical Decision Making Evolving/Moderate complexity    Clinical Decision Making Moderate    Rehab Potential Good    PT Frequency 1x / week    PT Duration 6 weeks    PT Treatment/Interventions ADLs/Self Care Home Management;Electrical Stimulation;Cryotherapy;Iontophoresis 4mg /ml Dexamethasone;Moist Heat;Traction;DME Instruction;Neuromuscular re-education;Patient/family education;Manual techniques;Passive range of motion;Taping    PT Next Visit Plan work on balance with patient,    PT Home Exercise Plan LTR; hamstring stretch, tennis ball trigger point release ; abdominal breathing;Access Code: [email or web address] Upper Trapezius Stretch - 1 x daily - 7 x weekly - 1 sets - 3 reps - 20 hold.Gentle Levator Scapulae Stretch - 1 x daily - 7 x weekly - 1 sets - 3 reps - 20 hold.Shoulder extension with resistance - Neutral - 1 x daily - 7 x weekly - 3 sets - 10 reps.Scapular Retraction with Resistance - 1 x daily - 7 x weekly - 3 sets - 10 reps    Consulted and Agree with Plan of Care Patient           Patient will benefit from skilled therapeutic intervention in order to improve the following deficits and impairments:  Abnormal gait, Difficulty walking, Decreased range of motion, Decreased mobility, Decreased strength, Postural dysfunction, Pain, Increased muscle spasms  Visit Diagnosis: Chronic bilateral low back pain without sciatica  Muscle weakness (generalized)  Cramp and spasm  Difficulty in walking, not elsewhere classified  Cervicalgia     Problem List Patient Active Problem List   Diagnosis Date Noted  . Sacroiliitis, not elsewhere classified ([place]) 12/22/2019  . Other secondary scoliosis, lumbar region 12/22/2019  . Arthritis of facet joint of  lumbar spine 12/22/2019  . Cervical radiculopathy 10/18/2019  . Numbness 09/21/2019  . Bilateral carpal tunnel syndrome 09/21/2019  . High risk medication use 09/21/2019  . Memory loss 09/21/2019  . Essential hypertension 08/25/2018  . Abnormal SPEP 02/19/2018  . Hand pain 02/20/2017  . Multiple joint pain 02/18/2017  . Right elbow pain 12/09/2016  . Disturbed cognition 06/25/2016  . Trochanteric bursitis of left hip 02/18/2016  . Sciatica, right side 10/30/2015  . Bilateral arm pain 10/10/2015  . Trochanteric bursitis of right hip 08/04/2014  . Adjustment disorder with mixed anxiety and depressed mood 08/04/2014  . Chronic fatigue 08/04/2014  . Urinary frequency 08/04/2014  . Abnormality of gait 08/04/2014  . Unspecified visual disturbance 01/31/2013  . Transient vision disturbance 01/31/2013  . Routine general medical examination at a health care facility 11/04/2011  . Colon polyps 11/03/2011  . Hot flashes, menopausal 10/13/2011  . POSTHERPETIC NEURALGIA 10/03/2009  . DISPLCMT LUMBAR INTERVERT [place] W/O MYELOPATHY 09/05/2009  . RENAL CALCULUS, RECURRENT 09/04/2009  . Hyperlipidemia 08/31/2009  .  MULTIPLE SCLEROSIS, PROGRESSIVE/RELAPSING 08/31/2009  . ALLERGIC RHINITIS 08/31/2009   During this treatment session, the therapist was present, participating in and directing the treatment.   Carolyne Littles PT DPT  01/25/2020  Minna Merritts SPT 01/25/2020, 1:54 PM  [facility] [address] [place], [place], [address] Phone: [phone]   Fax:  [phone]  Name: STEPHANINE REAS MRN: [id number] Date of Birth: [birth year]

## 2020-01-30 ENCOUNTER — Ambulatory Visit: Payer: Medicare Other | Admitting: Physical Therapy

## 2020-01-30 ENCOUNTER — Other Ambulatory Visit: Payer: Self-pay

## 2020-01-30 ENCOUNTER — Encounter: Payer: Self-pay | Admitting: Physical Therapy

## 2020-01-30 DIAGNOSIS — G8929 Other chronic pain: Secondary | ICD-10-CM | POA: Diagnosis not present

## 2020-01-30 DIAGNOSIS — M6281 Muscle weakness (generalized): Secondary | ICD-10-CM

## 2020-01-30 DIAGNOSIS — M545 Low back pain, unspecified: Secondary | ICD-10-CM

## 2020-01-30 DIAGNOSIS — R252 Cramp and spasm: Secondary | ICD-10-CM | POA: Diagnosis not present

## 2020-01-30 DIAGNOSIS — R262 Difficulty in walking, not elsewhere classified: Secondary | ICD-10-CM | POA: Diagnosis not present

## 2020-01-30 DIAGNOSIS — M542 Cervicalgia: Secondary | ICD-10-CM | POA: Diagnosis not present

## 2020-01-30 NOTE — Therapy (Addendum)
[place], [place], [address] Phone: [phone]   Fax:  [phone]  Physical Therapy Treatment  Patient Details  Name: Dawn [name] MRN: [id number] Date of Birth: [birth year] Referring Provider (PT): Dr Christell Constant   Encounter Date: 01/30/2020   PT End of Session - 01/30/20 1017    Visit Number 32    Number of Visits 38    Date for PT Re-Evaluation 03/12/20    PT Start Time 1016    PT Stop Time 1058    PT Time Calculation (min) 42 min    Equipment Utilized During Treatment Gait belt    Activity Tolerance Patient tolerated treatment well    Behavior During Therapy [facility] for tasks assessed/performed           Past Medical History:  Diagnosis Date  . Allergy   . Essential hypertension 08/25/2018  . Hot flashes, menopausal 10/13/2011   Estradiol started   . Memory loss 09/21/2019  . Multiple sclerosis ([place])   . Neuropathy   . Osteoarthritis   . Vision abnormalities     Past Surgical History:  Procedure Laterality Date  . ABDOMINAL HYSTERECTOMY  2002   partial  . LUMBAR FUSION      There were no vitals filed for this visit.   Subjective Assessment - 01/30/20 1017    Subjective Patient reports she is feeling good today. States she felt really good over the weekend. She went swimming on Friday. Her right hip is bothering her the most today.    How long can you stand comfortably? hurts as soon as she stands. Has to shift frequently    How long can you walk comfortably? limited community ambualtion    Diagnostic tests Nothign recent    Pain Score 1     Pain Location Hip    Pain Orientation Right    Pain Descriptors / Indicators Aching    Pain Type Chronic pain    Pain Onset More than a month ago    Pain Frequency Constant                             [facility] Adult PT Treatment/Exercise - 01/30/20 0001      Lumbar Exercises: Standing   Heel Raises 20 reps    Functional  Squats 15 reps    Functional Squats Limitations at counter    Other Standing Lumbar Exercises standing march 2x10; mini squat 2x10;       Lumbar Exercises: Seated   Other Seated Lumbar Exercises laq x10 each leg; hamstring curl 2x10 each leg       Lumbar Exercises: Supine   Clam Limitations supine red band 2x10     Other Supine Lumbar Exercises SAQ 2x10 both legs 1lb weight; supine march 2x10     Other Supine Lumbar Exercises Ball Squeeze 2x10 reps      Manual Therapy   Manual therapy comments --    Joint Mobilization PA glides from L2 to L5     Soft tissue mobilization to lumbar spine and upper gluteals bilateral ; trigger point release to bilateral upper traps     Manual Traction LAD Grade III-IV                    PT Short Term Goals - 01/30/20 1319      PT SHORT TERM GOAL #1   Title Patient will report no  pain radiating into anterior thigh    Baseline ino radiucalr pain at this time    Time 4    Period Weeks    Status Achieved      PT SHORT TERM GOAL #2   Title Patient will increase bilateral LE strength to 4/5    Baseline 4+/5 gross    Time 4    Period Weeks    Status Achieved    Target Date 05/24/19      PT SHORT TERM GOAL #3   Title Patient will increase passive right hip flexion to 105 degrrees    Baseline 100 today with pain onthe right side    Time 4    Period Weeks    Status On-going    Target Date 02/13/20      PT SHORT TERM GOAL #4   Title Patient will increase right cervical rotation by 10 degrees    Baseline to 58 degres today    Time 4    Period Weeks    Status On-going    Target Date 01/09/20      PT SHORT TERM GOAL #5   Title Patient will increase ross bilateral UE strength to 4+/5    Baseline improved to 4/5    Time 4    Period Weeks    Status Revised    Target Date 02/13/20             PT Long Term Goals - 01/30/20 1317      PT LONG TERM GOAL #1   Title Patient will stand for 30 min without self reported increase pain  in order to stand at a kitchen sink.    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     Baseline pain at edn range hip flexion and ir but intensity decreased    Time 6    Period Weeks    Status On-going    Target Date 02/27/20      PT LONG TERM GOAL #4   Title Patient will reach behind his head without pain in order to perfrom ADL's    Baseline mild pain and tightness but improved    Time 6    Period Weeks    Status On-going    Target Date 01/23/20      PT LONG TERM GOAL #5   Title Patient will report no radicular pain into her hands    Baseline more intermittent    Time 6    Period Weeks    Status On-going    Target Date 01/23/20      PT LONG TERM GOAL #6   Title POatient will improve ABC score to 50% function in order to decrease her risk of fall    Baseline 40%    Period Weeks    Status On-going    Target Date 01/23/20  Plan - 01/30/20 1308    Clinical Impression Statement Patient continues to make progress. Therapy did some STW on the upper glutes and lower back to relieve tightness. Therapy worked on strengthening exercises for the hip, quad, and glutes. Added in mini squats holding onto the counter for support and patient tolerated progression well. Therapy educated patient on monitoring symptoms after increasing amount of exercise today.    Personal Factors and Comorbidities Comorbidity 1;Comorbidity 2    Comorbidities MS, OA    Examination-Activity Limitations Locomotion Level;Carry;Stand;Lift;Stairs;Squat     Examination-Participation Restrictions Meal Prep;Cleaning;Community Activity;Laundry;Shop    Stability/Clinical Decision Making Evolving/Moderate complexity    Clinical Decision Making Moderate    Rehab Potential Good    PT Frequency 1x / week    PT Duration 6 weeks    PT Treatment/Interventions ADLs/Self Care Home Management;Electrical Stimulation;Cryotherapy;Iontophoresis 4mg /ml Dexamethasone;Moist Heat;Traction;DME Instruction;Neuromuscular re-education;Patient/family education;Manual techniques;Passive range of motion;Taping    PT Next Visit Plan work on balance with patient,    PT Home Exercise Plan LTR; hamstring stretch, tennis ball trigger point release ; abdominal breathing;Access Code: [email or web address].Seated Upper Trapezius Stretch - 1 x daily - 7 x weekly - 1 sets - 3 reps - 20 hold.Gentle Levator Scapulae Stretch - 1 x daily - 7 x weekly - 1 sets - 3 reps - 20 hold.Shoulder extension with resistance - Neutral - 1 x daily - 7 x weekly - 3 sets - 10 reps.Scapular Retraction with Resistance - 1 x daily - 7 x weekly - 3 sets - 10 reps    Consulted and Agree with Plan of Care Patient           Patient will benefit from skilled therapeutic intervention in order to improve the following deficits and impairments:  Abnormal gait, Difficulty walking, Decreased range of motion, Decreased mobility, Decreased strength, Postural dysfunction, Pain, Increased muscle spasms  Visit Diagnosis: Chronic bilateral low back pain without sciatica  Muscle weakness (generalized)  Cramp and spasm  Difficulty in walking, not elsewhere classified     Problem List Patient Active Problem List   Diagnosis Date Noted  . Sacroiliitis, not elsewhere classified ([place]) 12/22/2019  . Other secondary scoliosis, lumbar region 12/22/2019  . Arthritis of facet joint of lumbar spine 12/22/2019  . Cervical radiculopathy 10/18/2019  . Numbness 09/21/2019  . Bilateral carpal tunnel syndrome 09/21/2019  . High  risk medication use 09/21/2019  . Memory loss 09/21/2019  . Essential hypertension 08/25/2018  . Abnormal SPEP 02/19/2018  . Hand pain 02/20/2017  . Multiple joint pain 02/18/2017  . Right elbow pain 12/09/2016  . Disturbed cognition 06/25/2016  . Trochanteric bursitis of left hip 02/18/2016  . Sciatica, right side 10/30/2015  . Bilateral arm pain 10/10/2015  . Trochanteric bursitis of right hip 08/04/2014  . Adjustment disorder with mixed anxiety and depressed mood 08/04/2014  . Chronic fatigue 08/04/2014  . Urinary frequency 08/04/2014  . Abnormality of gait 08/04/2014  . Unspecified visual disturbance 01/31/2013  . Transient vision disturbance 01/31/2013  . Routine general medical examination at a health care facility 11/04/2011  . Colon polyps 11/03/2011  . Hot flashes, menopausal 10/13/2011  . POSTHERPETIC NEURALGIA 10/03/2009  . DISPLCMT LUMBAR INTERVERT [place] W/O MYELOPATHY 09/05/2009  . RENAL CALCULUS, RECURRENT 09/04/2009  . Hyperlipidemia 08/31/2009  . MULTIPLE SCLEROSIS, PROGRESSIVE/RELAPSING 08/31/2009  . ALLERGIC RHINITIS 08/31/2009    Carney Living PT DPT  01/30/2020, 1:53 PM   Minna Merritts PT DPT  01/30/2020  During this treatment session, the therapist was present,  participating in and directing the treatment.   [place], [place], [address] Phone: [phone]   Fax:  [phone]  Name: ORLANDRIA KISSNER MRN: [id number] Date of Birth: 01-07-1946

## 2020-02-13 ENCOUNTER — Ambulatory Visit: Payer: Medicare Other | Admitting: Physical Therapy

## 2020-02-13 ENCOUNTER — Other Ambulatory Visit: Payer: Self-pay

## 2020-02-13 ENCOUNTER — Encounter: Payer: Self-pay | Admitting: Physical Therapy

## 2020-02-13 DIAGNOSIS — M6281 Muscle weakness (generalized): Secondary | ICD-10-CM

## 2020-02-13 DIAGNOSIS — R262 Difficulty in walking, not elsewhere classified: Secondary | ICD-10-CM | POA: Diagnosis not present

## 2020-02-13 DIAGNOSIS — M545 Low back pain: Secondary | ICD-10-CM | POA: Diagnosis not present

## 2020-02-13 DIAGNOSIS — G8929 Other chronic pain: Secondary | ICD-10-CM | POA: Diagnosis not present

## 2020-02-13 DIAGNOSIS — M542 Cervicalgia: Secondary | ICD-10-CM | POA: Diagnosis not present

## 2020-02-13 DIAGNOSIS — R252 Cramp and spasm: Secondary | ICD-10-CM | POA: Diagnosis not present

## 2020-02-13 NOTE — Therapy (Signed)
[place], [place], [address] Phone: [phone]   Fax:  [phone]  Physical Therapy Treatment  Patient Details  Name: Dawn [name] MRN: [id number] Date of Birth: 1946/04/02 Referring Provider (PT): Dr Christell Constant   Encounter Date: 02/13/2020   PT End of Session - 02/13/20 1016    Visit Number 33    Number of Visits 38    Date for PT Re-Evaluation 03/12/20    Authorization Type Medicare Kx Modifier on visit 13 proress not on 30th visit    PT Start Time 1016    PT Stop Time 1112    PT Time Calculation (min) 56 min    Equipment Utilized During Treatment Gait belt    Activity Tolerance Patient tolerated treatment well    Behavior During Therapy WFL for tasks assessed/performed           Past Medical History:  Diagnosis Date  . Allergy   . Essential hypertension 08/25/2018  . Hot flashes, menopausal 10/13/2011   Estradiol started   . Memory loss 09/21/2019  . Multiple sclerosis ([place])   . Neuropathy   . Osteoarthritis   . Vision abnormalities     Past Surgical History:  Procedure Laterality Date  . ABDOMINAL HYSTERECTOMY  2002   partial  . LUMBAR FUSION      There were no vitals filed for this visit.   Subjective Assessment - 02/13/20 1017    Subjective Patient reports her pain has returned in her hips and lower back, R>L. She says her shot has worn off. Her shot was about 3 weeks ago. States her R thumb is frozen.    Pertinent History MS    How long can you stand comfortably? hurts as soon as she stands. Has to shift frequently    How long can you walk comfortably? limited community ambualtion    Diagnostic tests Nothign recent    Currently in Pain? Yes    Pain Score 5     Pain Location Hip    Pain Orientation Right    Pain Descriptors / Indicators Aching    Pain Type Chronic pain    Pain Radiating Towards into the buttock    Pain Onset More than a month ago    Pain Frequency  Constant    Aggravating Factors  standing and walking    Pain Relieving Factors rest and stretching    Effect of Pain on Daily Activities difficulty walking distances                             [facility] Adult PT Treatment/Exercise - 02/13/20 0001      Electrical Stimulation   Electrical Stimulation Location Low back     Electrical Stimulation Action --   IFC   Electrical Stimulation Parameters to tolerance     Electrical Stimulation Goals Edema;Pain      Manual Therapy   Joint Mobilization PA glides from L2 to L5     Soft tissue mobilization to lumbar spine and upper gluteals bilateral ; trigger point release to bilateral upper traps     Manual Traction LAD Grade III-IV            Trigger Point Dry Needling - 02/13/20 0001    Consent Given? Yes    Dry Needling Comments 3 needles in gluteals right 30x75 needle     Gluteus Medius Response Twitch response elicited  PT Education - 02/13/20 1133    Education Details HEP and symptom mangement    Person(s) Educated Patient    Methods Explanation;Demonstration;Tactile cues;Verbal cues    Comprehension Verbalized understanding;Returned demonstration;Verbal cues required;Tactile cues required            PT Short Term Goals - 01/30/20 1319      PT SHORT TERM GOAL #1   Title Patient will report no pain radiating into anterior thigh    Baseline ino radiucalr pain at this time    Time 4    Period Weeks    Status Achieved      PT SHORT TERM GOAL #2   Title Patient will increase bilateral LE strength to 4/5    Baseline 4+/5 gross    Time 4    Period Weeks    Status Achieved    Target Date 05/24/19      PT SHORT TERM GOAL #3   Title Patient will increase passive right hip flexion to 105 degrrees    Baseline 100 today with pain onthe right side    Time 4    Period Weeks    Status On-going    Target Date 02/13/20      PT SHORT TERM GOAL #4   Title Patient will increase right cervical  rotation by 10 degrees    Baseline to 58 degres today    Time 4    Period Weeks    Status On-going    Target Date 01/09/20      PT SHORT TERM GOAL #5   Title Patient will increase ross bilateral UE strength to 4+/5    Baseline improved to 4/5    Time 4    Period Weeks    Status Revised    Target Date 02/13/20             PT Long Term Goals - 01/30/20 1317      PT LONG TERM GOAL #1   Title Patient will stand for 30 min without self reported increase pain in order to stand at a kitchen sink.    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     Baseline pain at edn range hip flexion and ir but intensity decreased    Time 6    Period Weeks    Status On-going    Target Date 02/27/20      PT LONG TERM GOAL #4   Title Patient will reach behind his head without pain in order to perfrom ADL's    Baseline mild pain and tightness but improved    Time 6    Period Weeks    Status On-going    Target Date 01/23/20      PT LONG TERM GOAL #5   Title Patient will report no radicular pain into her hands    Baseline more  intermittent    Time 6    Period Weeks    Status On-going    Target Date 01/23/20      PT LONG TERM GOAL #6   Title POatient will improve ABC score to 50% function in order to decrease her risk of fall    Baseline 40%    Period Weeks    Status On-going    Target Date 01/23/20                 Plan - 02/13/20 1135    Clinical Impression Statement Treatment session focused mostly on manual therapy to reduce actue pain.  She reported a dsoignifcant improvement in pain after treatment. Therapy will alos look at the patients hand and wrist next visit. Therapy will hopefully progress patient back into exercises as toelrated. She had a great response to TPDN.    Personal Factors and Comorbidities Comorbidity 1;Comorbidity 2    Comorbidities MS, OA    Examination-Activity Limitations Locomotion Level;Carry;Stand;Lift;Stairs;Squat    Examination-Participation Restrictions Meal Prep;Cleaning;Community Activity;Laundry;Shop    Stability/Clinical Decision Making Evolving/Moderate complexity    Clinical Decision Making Moderate    Rehab Potential Good    PT Frequency 1x / week    PT Duration 6 weeks    PT Treatment/Interventions ADLs/Self Care Home Management;Electrical Stimulation;Cryotherapy;Iontophoresis 4mg /ml Dexamethasone;Moist Heat;Traction;DME Instruction;Neuromuscular re-education;Patient/family education;Manual techniques;Passive range of motion;Taping    PT Next Visit Plan work on balance with patient,    PT Home Exercise Plan LTR; hamstring stretch, tennis ball trigger point release ; abdominal breathing;Access Code: [email or web address].Seated Upper Trapezius Stretch - 1 x daily - 7 x weekly - 1 sets - 3 reps - 20 hold.Gentle Levator Scapulae Stretch - 1 x daily - 7 x weekly - 1 sets - 3 reps - 20 hold.Shoulder extension with resistance - Neutral - 1 x daily - 7 x weekly - 3 sets - 10 reps.Scapular Retraction with Resistance - 1 x daily - 7 x weekly - 3 sets - 10 reps    Consulted and Agree with Plan of Care Patient           Patient will benefit from skilled therapeutic intervention in order to improve the following deficits and impairments:  Abnormal gait, Difficulty walking, Decreased range of motion, Decreased mobility, Decreased strength, Postural dysfunction, Pain, Increased muscle spasms  Visit Diagnosis: Chronic bilateral low back pain without sciatica  Muscle weakness (generalized)  Cramp and  spasm  Difficulty in walking, not elsewhere classified  Cervicalgia     Problem List Patient Active Problem List   Diagnosis Date Noted  . Sacroiliitis, not elsewhere classified ([place]) 12/22/2019  . Other secondary scoliosis, lumbar region 12/22/2019  . Arthritis of facet joint of lumbar spine 12/22/2019  . Cervical radiculopathy 10/18/2019  . Numbness 09/21/2019  . Bilateral carpal tunnel syndrome 09/21/2019  . High risk medication use 09/21/2019  . Memory loss 09/21/2019  . Essential hypertension 08/25/2018  . Abnormal SPEP 02/19/2018  . Hand pain 02/20/2017  . Multiple joint pain 02/18/2017  . Right elbow pain 12/09/2016  . Disturbed cognition 06/25/2016  . Trochanteric bursitis of left hip 02/18/2016  . Sciatica, right side 10/30/2015  . Bilateral arm pain 10/10/2015  . Trochanteric bursitis of right hip 08/04/2014  . Adjustment disorder with mixed anxiety and depressed mood 08/04/2014  . Chronic fatigue 08/04/2014  . Urinary frequency 08/04/2014  . Abnormality of gait 08/04/2014  . Unspecified visual disturbance 01/31/2013  . Transient vision disturbance 01/31/2013  . Routine general medical  examination at a health care facility 11/04/2011  . Colon polyps 11/03/2011  . Hot flashes, menopausal 10/13/2011  . POSTHERPETIC NEURALGIA 10/03/2009  . DISPLCMT LUMBAR INTERVERT [place] W/O MYELOPATHY 09/05/2009  . RENAL CALCULUS, RECURRENT 09/04/2009  . Hyperlipidemia 08/31/2009  . MULTIPLE SCLEROSIS, PROGRESSIVE/RELAPSING 08/31/2009  . ALLERGIC RHINITIS 08/31/2009    Carney Living  PT DPT  02/13/2020, 11:44 AM  [facility] [address] [place], [place], [address] Phone: [phone]   Fax:  [phone]  Name: CHAVA DULAC MRN: [id number] Date of Birth: 1945/10/20

## 2020-02-16 ENCOUNTER — Telehealth: Payer: Self-pay | Admitting: Physical Medicine and Rehabilitation

## 2020-02-16 ENCOUNTER — Other Ambulatory Visit: Payer: Self-pay | Admitting: Obstetrics and Gynecology

## 2020-02-16 DIAGNOSIS — Z1231 Encounter for screening mammogram for malignant neoplasm of breast: Secondary | ICD-10-CM

## 2020-02-16 NOTE — Telephone Encounter (Signed)
Bilateral L5-S1 facets 01/19/20 Ok to repeat if helped, same problem/side, and no new injury?

## 2020-02-16 NOTE — Telephone Encounter (Signed)
If helped but temp, then repeat and look at RFA if not much then ov

## 2020-02-16 NOTE — Telephone Encounter (Signed)
Patient called requesting a call back to set appt for back injections. Please call patient to set appt at [phone].

## 2020-02-17 NOTE — Telephone Encounter (Signed)
Scheduled for 8/10 at 1415 with driver.

## 2020-02-20 ENCOUNTER — Other Ambulatory Visit: Payer: Self-pay

## 2020-02-20 ENCOUNTER — Encounter: Payer: Self-pay | Admitting: Physical Therapy

## 2020-02-20 ENCOUNTER — Ambulatory Visit: Payer: Medicare Other | Attending: Neurology | Admitting: Physical Therapy

## 2020-02-20 DIAGNOSIS — M6281 Muscle weakness (generalized): Secondary | ICD-10-CM | POA: Insufficient documentation

## 2020-02-20 DIAGNOSIS — M545 Low back pain, unspecified: Secondary | ICD-10-CM

## 2020-02-20 DIAGNOSIS — R262 Difficulty in walking, not elsewhere classified: Secondary | ICD-10-CM | POA: Insufficient documentation

## 2020-02-20 DIAGNOSIS — R252 Cramp and spasm: Secondary | ICD-10-CM | POA: Diagnosis not present

## 2020-02-20 DIAGNOSIS — G8929 Other chronic pain: Secondary | ICD-10-CM | POA: Diagnosis not present

## 2020-02-20 DIAGNOSIS — M542 Cervicalgia: Secondary | ICD-10-CM | POA: Diagnosis not present

## 2020-02-20 NOTE — Therapy (Signed)
[place], [place], [address] Phone: [phone]   Fax:  [phone]  Physical Therapy Treatment  Patient Details  Name: Dawn [name] MRN: [id number] Date of Birth: 01/21/1946 Referring Provider (PT): Dr Christell Constant   Encounter Date: 02/20/2020   PT End of Session - 02/20/20 1017    Visit Number 34    Number of Visits 38    Date for PT Re-Evaluation 03/12/20    Authorization Type Medicare Kx Modifier on visit 13 proress not on 30th visit    PT Start Time 1017    PT Stop Time 1058    PT Time Calculation (min) 41 min    Equipment Utilized During Treatment --    Activity Tolerance Patient tolerated treatment well    Behavior During Therapy [facility] for tasks assessed/performed           Past Medical History:  Diagnosis Date  . Allergy   . Essential hypertension 08/25/2018  . Hot flashes, menopausal 10/13/2011   Estradiol started   . Memory loss 09/21/2019  . Multiple sclerosis ([place])   . Neuropathy   . Osteoarthritis   . Vision abnormalities     Past Surgical History:  Procedure Laterality Date  . ABDOMINAL HYSTERECTOMY  2002   partial  . LUMBAR FUSION      There were no vitals filed for this visit.   Subjective Assessment - 02/20/20 1019    Subjective Pt reports she is feeling better than last session, but still not great. She has been doing her exercises and is able to do everything at home. Her R hip is bothering her the most.    Pertinent History MS    How long can you stand comfortably? hurts as soon as she stands. Has to shift frequently    How long can you walk comfortably? limited community ambualtion    Diagnostic tests Nothngn recent    Currently in Pain? Yes    Pain Score 4     Pain Location Hip    Pain Orientation Right    Pain Descriptors / Indicators Aching    Pain Type Chronic pain    Pain Radiating Towards into the buttock    Pain Onset More than a month ago    Pain  Frequency Constant    Aggravating Factors  standing and walking    Pain Relieving Factors rest and stretching    Effect of Pain on Daily Activities difficulty walking distances    Pain Score 3    Pain Location Back    Pain Orientation Right    Pain Descriptors / Indicators Aching    Pain Type Chronic pain    Pain Radiating Towards turning her head; using her arm    Pain Onset More than a month ago    Pain Frequency Constant    Aggravating Factors  none              [facility] PT Assessment - 02/20/20 0001      Assessment   Medical Diagnosis Right Sided Low Back Pain     Referring Provider (PT) Dr Christell Constant                         [facility] Adult PT Treatment/Exercise - 02/20/20 0001      Lumbar Exercises: Standing   Other Standing Lumbar Exercises Lateral weight shifting onto RLE x15; Marches x10 each leg    Other Standing  Lumbar Exercises Standing Hip Abduction x15 each leg; Hip Extension x10 each leg   in parallel bars     Manual Therapy   Soft tissue mobilization to lumbar spine and right upper gluteals    Manual Traction LAD Grade II-III            Trigger Point Dry Needling - 02/20/20 0001    Consent Given? Yes    Education Handout Provided Previously provided    Dry Needling Comments 3 needles in gluteals right 30x75 needle     Gluteus Medius Response Twitch response elicited                PT Education - 02/20/20 1040    Education Details reviewed light stretching and tehcnique with ther-ex    Person(s) Educated Patient    Methods Explanation;Demonstration;Verbal cues;Tactile cues    Comprehension Verbalized understanding;Verbal cues required;Returned demonstration;Tactile cues required            PT Short Term Goals - 01/30/20 1319      PT SHORT TERM GOAL #1   Title Patient will report no pain radiating into anterior thigh    Baseline ino radiucalr pain at this time    Time 4    Period Weeks    Status Achieved      PT SHORT  TERM GOAL #2   Title Patient will increase bilateral LE strength to 4/5    Baseline 4+/5 gross    Time 4    Period Weeks    Status Achieved    Target Date 05/24/19      PT SHORT TERM GOAL #3   Title Patient will increase passive right hip flexion to 105 degrrees    Baseline 100 today with pain onthe right side    Time 4    Period Weeks    Status On-going    Target Date 02/13/20      PT SHORT TERM GOAL #4   Title Patient will increase right cervical rotation by 10 degrees    Baseline to 58 degres today    Time 4    Period Weeks    Status On-going    Target Date 01/09/20      PT SHORT TERM GOAL #5   Title Patient will increase ross bilateral UE strength to 4+/5    Baseline improved to 4/5    Time 4    Period Weeks    Status Revised    Target Date 02/13/20             PT Long Term Goals - 01/30/20 1317      PT LONG TERM GOAL #1   Title Patient will stand for 30 min without self reported increase pain in order to stand at a kitchen sink.    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  Baseline pain at edn range hip flexion and ir but intensity decreased    Time 6    Period Weeks    Status On-going    Target Date 02/27/20      PT LONG TERM GOAL #4   Title Patient will reach behind his head without pain in order to perfrom ADL's    Baseline mild pain and tightness but improved    Time 6    Period Weeks    Status On-going    Target Date 01/23/20      PT LONG TERM GOAL #5   Title Patient will report no radicular pain into  her hands    Baseline more intermittent    Time 6    Period Weeks    Status On-going    Target Date 01/23/20      PT LONG TERM GOAL #6   Title POatient will improve ABC score to 50% function in order to decrease her risk of fall    Baseline 40%    Period Weeks    Status On-going    Target Date 01/23/20                 Plan - 02/20/20 1040    Clinical Impression Statement Patient reported improved pain with manual therapy and needling Therapy reviewdd light exercises and advised her to continue with exercises at home. Therapy reviewed standing exercises and weight shifting. She was advised to use as much UE sipport as needed.    Personal Factors and Comorbidities Comorbidity 1;Comorbidity 2    Comorbidities MS, OA    Examination-Activity Limitations Locomotion Level;Carry;Stand;Lift;Stairs;Squat    Examination-Participation Restrictions Meal Prep;Cleaning;Community Activity;Laundry;Shop    Stability/Clinical Decision Making Evolving/Moderate complexity    Clinical Decision Making Moderate    Rehab Potential Good    PT Frequency 1x / week    PT Duration 6 weeks    PT Treatment/Interventions ADLs/Self Care Home Management;Electrical Stimulation;Cryotherapy;Iontophoresis 4mg /ml Dexamethasone;Moist Heat;Traction;DME Instruction;Neuromuscular re-education;Patient/family education;Manual techniques;Passive range of motion;Taping    PT Next Visit Plan work on balance with patient,    PT Home Exercise Plan LTR; hamstring stretch, tennis ball trigger point release ; abdominal breathing;Access Code: [email or web address].Seated Upper Trapezius Stretch - 1 x daily - 7 x weekly - 1 sets - 3 reps - 20 hold.Gentle Levator Scapulae Stretch - 1 x daily - 7 x weekly - 1 sets - 3 reps - 20 hold.Shoulder extension with resistance - Neutral - 1 x daily - 7 x weekly - 3 sets - 10 reps.Scapular Retraction with Resistance - 1 x daily - 7 x weekly - 3 sets - 10 reps    Consulted and Agree with Plan of Care  Patient           Patient will benefit from skilled therapeutic intervention in order to improve the following deficits and impairments:  Abnormal gait, Difficulty walking, Decreased range of motion, Decreased mobility, Decreased strength, Postural dysfunction, Pain, Increased muscle spasms  Visit Diagnosis: Chronic bilateral low back pain without sciatica  Muscle weakness (generalized)  Cramp and spasm  Difficulty in walking, not elsewhere classified  Cervicalgia     Problem List Patient Active Problem List   Diagnosis Date Noted  . Sacroiliitis, not elsewhere classified ([place]) 12/22/2019  . Other secondary scoliosis, lumbar region 12/22/2019  . Arthritis of facet joint of lumbar spine 12/22/2019  . Cervical radiculopathy 10/18/2019  . Numbness 09/21/2019  . Bilateral carpal tunnel syndrome 09/21/2019  . High risk medication use 09/21/2019  .  Memory loss 09/21/2019  . Essential hypertension 08/25/2018  . Abnormal SPEP 02/19/2018  . Hand pain 02/20/2017  . Multiple joint pain 02/18/2017  . Right elbow pain 12/09/2016  . Disturbed cognition 06/25/2016  . Trochanteric bursitis of left hip 02/18/2016  . Sciatica, right side 10/30/2015  . Bilateral arm pain 10/10/2015  . Trochanteric bursitis of right hip 08/04/2014  . Adjustment disorder with mixed anxiety and depressed mood 08/04/2014  . Chronic fatigue 08/04/2014  . Urinary frequency 08/04/2014  . Abnormality of gait 08/04/2014  . Unspecified visual disturbance 01/31/2013  . Transient vision disturbance 01/31/2013  . Routine general medical examination at a health care facility 11/04/2011  . Colon polyps 11/03/2011  . Hot flashes, menopausal 10/13/2011  . POSTHERPETIC NEURALGIA 10/03/2009  . DISPLCMT LUMBAR INTERVERT [place] W/O MYELOPATHY 09/05/2009  . RENAL CALCULUS, RECURRENT 09/04/2009  . Hyperlipidemia 08/31/2009  . MULTIPLE SCLEROSIS, PROGRESSIVE/RELAPSING 08/31/2009  . ALLERGIC RHINITIS 08/31/2009     Carney Living  PT DPT  02/20/2020, 1:41 PM  [facility] [address] [place], [place], [address] Phone: [phone]   Fax:  [phone]  Name: SHAELEE FORNI MRN: [id number] Date of Birth: 09/28/1945

## 2020-02-23 DIAGNOSIS — Z124 Encounter for screening for malignant neoplasm of cervix: Secondary | ICD-10-CM | POA: Diagnosis not present

## 2020-02-23 DIAGNOSIS — Z79890 Hormone replacement therapy (postmenopausal): Secondary | ICD-10-CM | POA: Diagnosis not present

## 2020-02-23 DIAGNOSIS — N951 Menopausal and female climacteric states: Secondary | ICD-10-CM | POA: Diagnosis not present

## 2020-02-27 ENCOUNTER — Ambulatory Visit: Payer: Medicare Other | Admitting: Physical Therapy

## 2020-02-27 ENCOUNTER — Encounter: Payer: Self-pay | Admitting: Physical Therapy

## 2020-02-27 ENCOUNTER — Other Ambulatory Visit: Payer: Self-pay

## 2020-02-27 DIAGNOSIS — G8929 Other chronic pain: Secondary | ICD-10-CM | POA: Diagnosis not present

## 2020-02-27 DIAGNOSIS — M545 Low back pain, unspecified: Secondary | ICD-10-CM

## 2020-02-27 DIAGNOSIS — R262 Difficulty in walking, not elsewhere classified: Secondary | ICD-10-CM | POA: Diagnosis not present

## 2020-02-27 DIAGNOSIS — M6281 Muscle weakness (generalized): Secondary | ICD-10-CM

## 2020-02-27 DIAGNOSIS — M542 Cervicalgia: Secondary | ICD-10-CM | POA: Diagnosis not present

## 2020-02-27 DIAGNOSIS — R252 Cramp and spasm: Secondary | ICD-10-CM | POA: Diagnosis not present

## 2020-02-27 NOTE — Therapy (Signed)
[place], [place], [address] Phone: [phone]   Fax:  [phone]  Physical Therapy Treatment  Patient Details  Name: Dawn [name] MRN: [id number] Date of Birth: 03-25-1946 Referring Provider (PT): Dr Christell Constant   Encounter Date: 02/27/2020   PT End of Session - 02/27/20 1020    Visit Number 35    Number of Visits 38    Date for PT Re-Evaluation 03/12/20    Authorization Type Medicare Kx Modifier on visit 13 proress not on 30th visit    PT Start Time 1018    PT Stop Time 1100    PT Time Calculation (min) 42 min    Equipment Utilized During Treatment Gait belt    Activity Tolerance Patient tolerated treatment well    Behavior During Therapy [facility] for tasks assessed/performed           Past Medical History:  Diagnosis Date   Allergy    Essential hypertension 08/25/2018   Hot flashes, menopausal 10/13/2011   Estradiol started    Memory loss 09/21/2019   Multiple sclerosis ([place])    Neuropathy    Osteoarthritis    Vision abnormalities     Past Surgical History:  Procedure Laterality Date   ABDOMINAL HYSTERECTOMY  2002   partial   LUMBAR FUSION      There were no vitals filed for this visit.   Subjective Assessment - 02/27/20 1020    Subjective Pt reports she is feeling tight today. She was working on her sink over the weekend crouching down, so she thinks that caused some extra tightness. She goes for a shot Thursday.    Pertinent History MS    How long can you stand comfortably? hurts as soon as she stands. Has to shift frequently    How long can you walk comfortably? limited community ambualtion    Diagnostic tests Nothing recent    Currently in Pain? Yes    Pain Score 4     Pain Location Hip    Pain Orientation Right    Pain Descriptors / Indicators Aching    Pain Type Chronic pain    Pain Radiating Towards into the buttock    Pain Onset More than a month ago    Pain  Frequency Constant    Aggravating Factors  standing and walking    Pain Relieving Factors rest and stretching    Effect of Pain on Daily Activities difficulty walking distances              [facility] PT Assessment - 02/27/20 0001      Assessment   Medical Diagnosis Right Sided Low Back Pain     Referring Provider (PT) Dr Christell Constant                         [facility] Adult PT Treatment/Exercise - 02/27/20 0001      Lumbar Exercises: Stretches   Lower Trunk Rotation Limitations x20    Piriformis Stretch 2 reps;20 seconds      Lumbar Exercises: Standing   Functional Squats 15 reps    Functional Squats Limitations at counter    Other Standing Lumbar Exercises Standing Marches x10 each leg at counter    Other Standing Lumbar Exercises Standing Hip Abduction x10 each leg; Hip Extension x10 each leg   in parallel bars     Manual Therapy   Soft tissue mobilization to lumbar spine and right upper  gluteals    Manual Traction LAD Grade II-III            Trigger Point Dry Needling - 02/27/20 0001    Consent Given? Yes    Education Handout Provided Previously provided    Dry Needling Comments 3 needles in gluteals right 30x75 needle     Gluteus Medius Response Twitch response elicited                PT Education - 02/27/20 1047    Education Details reviewed technique with ther-ex    Person(s) Educated Patient    Methods Explanation;Demonstration;Tactile cues;Verbal cues    Comprehension Verbalized understanding;Returned demonstration;Tactile cues required;Verbal cues required            PT Short Term Goals - 01/30/20 1319      PT SHORT TERM GOAL #1   Title Patient will report no pain radiating into anterior thigh    Baseline ino radiucalr pain at this time    Time 4    Period Weeks    Status Achieved      PT SHORT TERM GOAL #2   Title Patient will increase bilateral LE strength to 4/5    Baseline 4+/5 gross    Time 4    Period Weeks    Status  Achieved    Target Date 05/24/19      PT SHORT TERM GOAL #3   Title Patient will increase passive right hip flexion to 105 degrrees    Baseline 100 today with pain onthe right side    Time 4    Period Weeks    Status On-going    Target Date 02/13/20      PT SHORT TERM GOAL #4   Title Patient will increase right cervical rotation by 10 degrees    Baseline to 58 degres today    Time 4    Period Weeks    Status On-going    Target Date 01/09/20      PT SHORT TERM GOAL #5   Title Patient will increase ross bilateral UE strength to 4+/5    Baseline improved to 4/5    Time 4    Period Weeks    Status Revised    Target Date 02/13/20             PT Long Term Goals - 01/30/20 1317      PT LONG TERM GOAL #1   Title Patient will stand for 30 min without self reported increase pain in order to stand at a kitchen sink.    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     Baseline pain at edn range hip flexion and ir but intensity decreased    Time 6    Period Weeks    Status On-going    Target Date 02/27/20  PT LONG TERM GOAL #4   Title Patient will reach behind his head without pain in order to perfrom ADL's    Baseline mild pain and tightness but improved    Time 6    Period Weeks    Status On-going    Target Date 01/23/20      PT LONG TERM GOAL #5   Title Patient will report no radicular pain into her hands    Baseline more intermittent    Time 6    Period Weeks    Status On-going    Target Date 01/23/20      PT LONG TERM  GOAL #6   Title POatient will improve ABC score to 50% function in order to decrease her risk of fall    Baseline 40%    Period Weeks    Status On-going    Target Date 01/23/20                 Plan - 02/27/20 1048    Clinical Impression Statement Therapy focused on reducing right hip pain. She had a significant improvemnt in pain with manual therapy. She was able to complete ther-ex without a significant increase in pain. Therapy will continue to progress strengthening and balance activity as tolerated.    Personal Factors and Comorbidities Comorbidity 1;Comorbidity 2    Comorbidities MS, OA    Examination-Activity Limitations Locomotion Level;Carry;Stand;Lift;Stairs;Squat    Stability/Clinical Decision Making Evolving/Moderate complexity    Clinical Decision Making Moderate    Rehab Potential Good    PT Frequency 1x / week    PT Duration 6 weeks    PT Treatment/Interventions ADLs/Self Care Home Management;Electrical Stimulation;Cryotherapy;Iontophoresis 4mg /ml Dexamethasone;Moist Heat;Traction;DME Instruction;Neuromuscular re-education;Patient/family education;Manual techniques;Passive range of motion;Taping    PT Home Exercise Plan LTR; hamstring stretch, tennis ball trigger point release ; abdominal breathing;Access Code: [email or web address] Upper Trapezius Stretch - 1 x daily - 7 x weekly - 1 sets - 3 reps - 20 holdGentle Levator Scapulae Stretch - 1 x daily - 7 x weekly - 1 sets - 3 reps - 20 holdShoulder extension with resistance - Neutral - 1 x daily - 7 x weekly - 3 sets - 10 repsScapular Retraction with Resistance - 1 x daily - 7 x weekly - 3 sets - 10 reps    Consulted and Agree with Plan of Care Patient           Patient will benefit from skilled therapeutic intervention in order to improve the following deficits and impairments:  Abnormal gait, Difficulty walking, Decreased range of motion, Decreased mobility, Decreased strength, Postural dysfunction, Pain,  Increased muscle spasms  Visit Diagnosis: Chronic bilateral low back pain without sciatica  Muscle weakness (generalized)  Cramp and spasm  Difficulty in walking, not elsewhere classified     Problem List Patient Active Problem List   Diagnosis Date Noted   Sacroiliitis, not elsewhere classified ([place]) 12/22/2019   Other secondary scoliosis, lumbar region 12/22/2019   Arthritis of facet joint of lumbar spine 12/22/2019   Cervical radiculopathy 10/18/2019   Numbness 09/21/2019   Bilateral carpal tunnel syndrome 09/21/2019   High risk medication use 09/21/2019   Memory loss 09/21/2019   Essential hypertension 08/25/2018   Abnormal SPEP 02/19/2018   Hand pain 02/20/2017   Multiple joint pain 02/18/2017   Right elbow pain 12/09/2016   Disturbed cognition 06/25/2016   Trochanteric bursitis of left hip 02/18/2016   Sciatica, right side 10/30/2015   Bilateral arm pain 10/10/2015   Trochanteric bursitis  of right hip 08/04/2014   Adjustment disorder with mixed anxiety and depressed mood 08/04/2014   Chronic fatigue 08/04/2014   Urinary frequency 08/04/2014   Abnormality of gait 08/04/2014   Unspecified visual disturbance 01/31/2013   Transient vision disturbance 01/31/2013   Routine general medical examination at a health care facility 11/04/2011   Colon polyps 11/03/2011   Hot flashes, menopausal 10/13/2011   POSTHERPETIC NEURALGIA 10/03/2009   DISPLCMT LUMBAR INTERVERT [place] W/O MYELOPATHY 09/05/2009   RENAL CALCULUS, RECURRENT 09/04/2009   Hyperlipidemia 08/31/2009   MULTIPLE SCLEROSIS, PROGRESSIVE/RELAPSING 08/31/2009   ALLERGIC RHINITIS 08/31/2009    Carney Living PT DPT  02/27/2020, 11:40 AM  Minna Merritts SPT  02/27/2020  During this treatment session, the therapist was present, participating in and directing the treatment.  [place], [place],  [address] Phone: [phone]   Fax:  [phone]  Name: Dawn [name] MRN: [id number] Date of Birth: Jan 24, 1946

## 2020-02-28 ENCOUNTER — Encounter: Payer: Self-pay | Admitting: Physical Medicine and Rehabilitation

## 2020-02-28 ENCOUNTER — Ambulatory Visit: Payer: Self-pay

## 2020-02-28 ENCOUNTER — Ambulatory Visit (INDEPENDENT_AMBULATORY_CARE_PROVIDER_SITE_OTHER): Payer: Medicare Other | Admitting: Physical Medicine and Rehabilitation

## 2020-02-28 VITALS — BP 157/82 | HR 82

## 2020-02-28 DIAGNOSIS — M47816 Spondylosis without myelopathy or radiculopathy, lumbar region: Secondary | ICD-10-CM

## 2020-02-28 MED ORDER — METHYLPREDNISOLONE ACETATE 80 MG/ML IJ SUSP
80.0000 mg | Freq: Once | INTRAMUSCULAR | Status: AC
  Administered 2020-02-28: 80 mg

## 2020-02-28 NOTE — Progress Notes (Signed)
Dawn [name] - 74 y.o. female MRN [id number]  Date of birth: [birth year]  Office Visit Note: Visit Date: 02/28/2020 PCP: Gayland Curry, DO Referred by: Gayland Curry, DO  Subjective: Chief Complaint  Patient presents with  . Lower Back - Pain  . Right Hip - Pain  . Left Hip - Pain   HPI:  Dawn [name] is a 74 y.o. female who comes in today for planned repeat Bilateral L5-S1 Lumbar facet/medial branch block with fluoroscopic guidance.  The patient has failed conservative care including home exercise, medications, time and activity modification.  This injection will be diagnostic and hopefully therapeutic.  Please see requesting physician notes for further details and justification.  Exam shows concordant low back pain with facet joint loading and extension. Patient received more than 80% pain relief from prior injection.  Initially injected sacroiliac joints at the request of Dr. Durward Fortes with some relief but she did much better with diagnostic blocks at L5-S1.  She continues to follow with neurology with Dr. Felecia Shelling.  She is currently in physical therapy and getting stronger from that but not really helping with the pain relief.  She has had medication management etc.  Depending on relief with the second block would look at radiofrequency ablation of the lower lumbar facets.    Referring:Dr. Joni Fears     ROS Otherwise per HPI.  Assessment & Plan: Visit Diagnoses:  1. Spondylosis without myelopathy or radiculopathy, lumbar region     Plan: No additional findings.   Meds & Orders:  Meds ordered this encounter  Medications  . methylPREDNISolone acetate (DEPO-MEDROL) injection 80 mg    Orders Placed This Encounter  Procedures  . Facet Injection  . XR C-ARM NO REPORT    Follow-up: Return for Review Pain Diary.   Procedures: No procedures performed  Lumbar Diagnostic Facet Joint Nerve Block with Fluoroscopic Guidance   Patient: Dawn [name]      Date of Birth: Oct 18, 1945 MRN: [id number] PCP: Gayland Curry, DO      Visit Date: 02/28/2020   Universal Protocol:    Date/Time: 08/12/215:51 AM  Consent Given By: the patient  Position: PRONE  Additional Comments: Vital signs were monitored before and after the procedure. Patient was prepped and draped in the usual sterile fashion. The correct patient, procedure, and site was verified.   Injection Procedure Details:  Procedure Site One Meds Administered:  Meds ordered this encounter  Medications  . methylPREDNISolone acetate (DEPO-MEDROL) injection 80 mg     Laterality: Bilateral  Location/Site:  L5-S1  Needle size: 22 ga.  Needle type:spinal  Needle Placement: Oblique pedical  Findings:   -Comments: There was excellent flow of contrast along the articular pillars without intravascular flow.  Procedure Details: The fluoroscope beam is vertically oriented in AP and then obliqued 15 to 20 degrees to the ipsilateral side of the desired nerve to achieve the "Scotty dog" appearance.  The skin over the target area of the junction of the superior articulating process and the transverse process (sacral ala if blocking the L5 dorsal rami) was locally anesthetized with a 1 ml volume of 1% Lidocaine without Epinephrine.  The spinal needle was inserted and advanced in a trajectory view down to the target.   After contact with periosteum and negative aspirate for blood and CSF, correct placement without intravascular or epidural spread was confirmed by injecting 0.5 ml. of Isovue-250.  A spot radiograph was obtained of this image.  Next, a 0.5 ml. volume of the injectate described above was injected. The needle was then redirected to the other facet joint nerves mentioned above if needed.  Prior to the procedure, the patient was given a Pain Diary which was completed for baseline measurements.  After the procedure, the patient rated their pain every 30 minutes and  will continue rating at this frequency for a total of 5 hours.  The patient has been asked to complete the Diary and return to [place] by mail, fax or hand delivered as soon as possible.   Additional Comments:  The patient tolerated the procedure well Dressing: 2 x 2 sterile gauze and Band-Aid    Post-procedure details: Patient was observed during the procedure. Post-procedure instructions were reviewed.  Patient left the clinic in stable condition.    Clinical History: Acute Interface, Incoming Rad Results - 11/10/2017 2:17 PM EDT  TECHNIQUE: Multiplanar, multisequence MR imaging obtained through the lumbar spine without contrast.   COMPARISON: None.   INDICATION: Lumbago with sciatica, left side   FINDINGS:  Osseous structures: Normal marrow signal. No fracture or vertebral body height loss. In the L1 vertebral body there is a 15 mm lesion that is mildly hypointense the marrow T1/T2 and isointense on STIR. No pars defects. Previous surgery in the lower  lumbar spine. No large bony defects.  Alignment: 1-2 mm retrolisthesis of L2 on L3 and L3 and L4.  Conus medullaris/cauda equina: Normal in appearance.  Paraspinous soft tissues: Unremarkable. Small amount of old surgical scarring/artifact noted at the L4-5 level.  Lower thoracic spine: Unremarkable.    Levels:  T12-L1: No significant stenosis.  L1-L2: No significant stenosis.  L2-L3: Minimal retrolisthesis. Small broad-based posterior disc bulge, ligamentum flavum thickening and trace facet joint fluid. Mild canal stenosis and mild bilateral foraminal stenosis.  L3-L4: Minimal retrolisthesis. Small broad-based posterior disc bulge and annular tear. Ligamentum flavum thickening. Mild canal stenosis. Moderate right and mild left foraminal stenosis.  L4-L5: Small broad-based posterior disc bulge with thickening of the ligamentum flavum. The canal is patent. There is mild foraminal stenosis on the right greater than left.  L5-S1:  Small broad-based posterior disc bulge with right foraminal component. Mild facet arthrosis. Mild right foraminal stenosis.    IMPRESSION:  1. Multilevel lumbar disc degenerative change and facet arthrosis as detailed. There is mild canal stenosis at L2-3 and L3-4. There is moderate right foraminal stenosis at L3-4 with otherwisemild foraminal stenoses.  2. Lesion of the L1 vertebral body. This is most likely a benign hemangioma with atypical signal however strictly cannot exclude other etiology including myeloma. Correlate with pertinent labs and any available prior imaging.Otherwise consider a 6  month follow-up to assure stability.   Electronically Signed by: Tonita Phoenix Specimen Collected: - Date: 11/10/17 Received From: [facility]     Objective:  VS:  HT:    WT:   BMI:     BP:(!) 157/82  HR:82bpm  TEMP: ( )  RESP:  Physical Exam Musculoskeletal:     Comments: Patient somewhat slow to rise from a seated position to full extension.  There is concordant low back pain with facet loading and lumbar spine extension rotation.  There are no definitive trigger points but the patient is somewhat tender across the lower back and PSIS.  There is no pain with hip rotation.      Imaging: No results found.

## 2020-02-28 NOTE — Progress Notes (Signed)
Pt states right lower back that ravels to her right buttock. Pt state she has both hip pain. Pt states blending what makes the pain worse. Pt states excise and heat/ice helps with the pain. Pt has hx of inj on 01/19/20. Pt state it lasted for about three weeks than the next it was gone.  Numeric Pain Rating Scale and Functional Assessment Average Pain 5   In the last MONTH (on 0-10 scale) has pain interfered with the following?  1. General activity like being  able to carry out your everyday physical activities such as walking, climbing stairs, carrying groceries, or moving a chair?  Rating(8)   +Driver, +BT, -Dye Allergies.

## 2020-03-01 NOTE — Procedures (Signed)
Lumbar Diagnostic Facet Joint Nerve Block with Fluoroscopic Guidance   Patient: Dawn [name]      Date of Birth: [birth year] MRN: [id number] PCP: Gayland Curry, DO      Visit Date: 02/28/2020   Universal Protocol:    Date/Time: 08/12/215:51 AM  Consent Given By: the patient  Position: PRONE  Additional Comments: Vital signs were monitored before and after the procedure. Patient was prepped and draped in the usual sterile fashion. The correct patient, procedure, and site was verified.   Injection Procedure Details:  Procedure Site One Meds Administered:  Meds ordered this encounter  Medications  . methylPREDNISolone acetate (DEPO-MEDROL) injection 80 mg     Laterality: Bilateral  Location/Site:  L5-S1  Needle size: 22 ga.  Needle type:spinal  Needle Placement: Oblique pedical  Findings:   -Comments: There was excellent flow of contrast along the articular pillars without intravascular flow.  Procedure Details: The fluoroscope beam is vertically oriented in AP and then obliqued 15 to 20 degrees to the ipsilateral side of the desired nerve to achieve the "Scotty dog" appearance.  The skin over the target area of the junction of the superior articulating process and the transverse process (sacral ala if blocking the L5 dorsal rami) was locally anesthetized with a 1 ml volume of 1% Lidocaine without Epinephrine.  The spinal needle was inserted and advanced in a trajectory view down to the target.   After contact with periosteum and negative aspirate for blood and CSF, correct placement without intravascular or epidural spread was confirmed by injecting 0.5 ml. of Isovue-250.  A spot radiograph was obtained of this image.    Next, a 0.5 ml. volume of the injectate described above was injected. The needle was then redirected to the other facet joint nerves mentioned above if needed.  Prior to the procedure, the patient was given a Pain Diary which was completed for  baseline measurements.  After the procedure, the patient rated their pain every 30 minutes and will continue rating at this frequency for a total of 5 hours.  The patient has been asked to complete the Diary and return to [place] by mail, fax or hand delivered as soon as possible.   Additional Comments:  The patient tolerated the procedure well Dressing: 2 x 2 sterile gauze and Band-Aid    Post-procedure details: Patient was observed during the procedure. Post-procedure instructions were reviewed.  Patient left the clinic in stable condition.

## 2020-03-05 ENCOUNTER — Ambulatory Visit: Payer: Medicare Other | Admitting: Physical Therapy

## 2020-03-05 ENCOUNTER — Other Ambulatory Visit: Payer: Self-pay

## 2020-03-05 DIAGNOSIS — M6281 Muscle weakness (generalized): Secondary | ICD-10-CM

## 2020-03-05 DIAGNOSIS — M542 Cervicalgia: Secondary | ICD-10-CM

## 2020-03-05 DIAGNOSIS — R252 Cramp and spasm: Secondary | ICD-10-CM | POA: Diagnosis not present

## 2020-03-05 DIAGNOSIS — G8929 Other chronic pain: Secondary | ICD-10-CM | POA: Diagnosis not present

## 2020-03-05 DIAGNOSIS — M545 Low back pain, unspecified: Secondary | ICD-10-CM

## 2020-03-05 DIAGNOSIS — R262 Difficulty in walking, not elsewhere classified: Secondary | ICD-10-CM | POA: Diagnosis not present

## 2020-03-05 NOTE — Therapy (Signed)
[place], [place], [address] Phone: [phone]   Fax:  [phone]  Physical Therapy Treatment  Patient Details  Name: Dawn [name] MRN: [id number] Date of Birth: 13-Nov-1945 Referring Provider (PT): Dr Christell Constant   Encounter Date: 03/05/2020   PT End of Session - 03/05/20 1326    Visit Number 36    Number of Visits 38    Date for PT Re-Evaluation 03/12/20    Authorization Type Medicare Kx Modifier on visit 13 proress not on 30th visit    PT Start Time 1015    PT Stop Time 1058    PT Time Calculation (min) 43 min    Activity Tolerance Patient tolerated treatment well    Behavior During Therapy [facility] for tasks assessed/performed           Past Medical History:  Diagnosis Date  . Allergy   . Essential hypertension 08/25/2018  . Hot flashes, menopausal 10/13/2011   Estradiol started   . Memory loss 09/21/2019  . Multiple sclerosis ([place])   . Neuropathy   . Osteoarthritis   . Vision abnormalities     Past Surgical History:  Procedure Laterality Date  . ABDOMINAL HYSTERECTOMY  2002   partial  . LUMBAR FUSION      There were no vitals filed for this visit.   Subjective Assessment - 03/05/20 1322    Subjective Pastient had an injection on 8/10. She is having no pain where the injectis were in her back. she continues to have pain in her right gluteal but it has imprved. She feels good and is doing well.    How long can you stand comfortably? hurts as soon as she stands. Has to shift frequently    Diagnostic tests Nothing recent    Currently in Pain? Yes    Pain Score 3     Pain Location Hip    Pain Orientation Right    Pain Descriptors / Indicators Aching    Pain Type Chronic pain    Pain Radiating Towards into the buttock    Pain Onset More than a month ago    Pain Frequency Constant    Aggravating Factors  standing and walking    Pain Relieving Factors rest and stretching    Effect of  Pain on Daily Activities difficulty walking distances                             [facility] Adult PT Treatment/Exercise - 03/05/20 0001      Neck Exercises: Seated   Other Seated Exercise laq 2x10; hamstring curl red 2x10; clamshells 2x20 red       Lumbar Exercises: Standing   Heel Raises Limitations x20     Other Standing Lumbar Exercises Standing Marches x10 each leg at counter    Other Standing Lumbar Exercises step up x10 bilateral 4 inch side step and forward       Manual Therapy   Joint Mobilization [facility] mobilization; gross thumb movement. Patient shown how to do it on her onw. Her grip is effecting her ability to perfrom her UE back and poture exercises.     Soft tissue mobilization to lumbar spine and right upper gluteals    Manual Traction LAD Grade II-III                  PT Education - 03/05/20 1323    Education Details reviewed HEp  and symptom mangement    Person(s) Educated Patient    Methods Explanation;Tactile cues;Verbal cues;Demonstration    Comprehension Verbalized understanding;Returned demonstration;Verbal cues required;Tactile cues required            PT Short Term Goals - 01/30/20 1319      PT SHORT TERM GOAL #1   Title Patient will report no pain radiating into anterior thigh    Baseline ino radiucalr pain at this time    Time 4    Period Weeks    Status Achieved      PT SHORT TERM GOAL #2   Title Patient will increase bilateral LE strength to 4/5    Baseline 4+/5 gross    Time 4    Period Weeks    Status Achieved    Target Date 05/24/19      PT SHORT TERM GOAL #3   Title Patient will increase passive right hip flexion to 105 degrrees    Baseline 100 today with pain onthe right side    Time 4    Period Weeks    Status On-going    Target Date 02/13/20      PT SHORT TERM GOAL #4   Title Patient will increase right cervical rotation by 10 degrees    Baseline to 58 degres today    Time 4    Period Weeks    Status  On-going    Target Date 01/09/20      PT SHORT TERM GOAL #5   Title Patient will increase ross bilateral UE strength to 4+/5    Baseline improved to 4/5    Time 4    Period Weeks    Status Revised    Target Date 02/13/20             PT Long Term Goals - 01/30/20 1317      PT LONG TERM GOAL #1   Title Patient will stand for 30 min without self reported increase pain in order to stand at a kitchen sink.    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     Baseline pain at edn range hip flexion and ir but intensity decreased    Time 6    Period Weeks    Status On-going    Target Date 02/27/20      PT LONG TERM GOAL #4   Title Patient will reach behind his head without pain in order to perfrom ADL's    Baseline mild pain and tightness but improved    Time 6    Period Weeks    Status On-going    Target Date 01/23/20      PT LONG TERM GOAL #5   Title Patient will report no radicular pain into her hands    Baseline more intermittent    Time 6    Period Weeks  Status On-going    Target Date 01/23/20      PT LONG TERM GOAL #6   Title POatient will improve ABC score to 50% function in order to decrease her risk of fall    Baseline 40%    Period Weeks    Status On-going    Target Date 01/23/20                 Plan - 03/05/20 1102    Clinical Impression Statement Patienttolerated treatment well. She has less spasming in her gluteals following her shot. She has been working on her stretches and exercises at home. Therapy  addressed her thumb stiffness. She has crepitus in her [facility] join but no pain. She was advised to keep working on the mobility in this joint. Therapy added stair trianing today.    Personal Factors and Comorbidities Comorbidity 1;Comorbidity 2    Comorbidities MS, OA    Examination-Activity Limitations Locomotion Level;Carry;Stand;Lift;Stairs;Squat    Examination-Participation Restrictions Meal Prep;Cleaning;Community Activity;Laundry;Shop    Clinical Decision Making Moderate    Rehab Potential Good    PT Frequency 1x / week    PT Duration 6 weeks    PT Treatment/Interventions ADLs/Self Care Home Management;Electrical Stimulation;Cryotherapy;Iontophoresis 4mg /ml Dexamethasone;Moist Heat;Traction;DME Instruction;Neuromuscular re-education;Patient/family education;Manual techniques;Passive range of motion;Taping    PT Next Visit Plan work on balance with patient,    PT Home Exercise Plan LTR; hamstring stretch, tennis ball trigger point release ; abdominal breathing;Access Code: [email or web address].Seated Upper Trapezius Stretch - 1 x daily - 7 x weekly - 1 sets - 3 reps - 20 hold.Gentle Levator Scapulae Stretch - 1 x daily - 7 x weekly - 1 sets - 3 reps - 20 hold.Shoulder extension with resistance - Neutral - 1 x daily - 7 x weekly - 3 sets - 10 reps.Scapular Retraction with Resistance - 1 x daily - 7 x weekly - 3 sets - 10 reps    Consulted and Agree with Plan of Care Patient           Patient will benefit from skilled therapeutic intervention in order to improve the following deficits and impairments:  Abnormal gait, Difficulty walking, Decreased range of motion, Decreased mobility, Decreased strength, Postural dysfunction, Pain, Increased muscle spasms  Visit Diagnosis: Chronic bilateral low back pain without sciatica  Muscle weakness (generalized)  Cramp and spasm  Difficulty in walking, not elsewhere classified  Cervicalgia     Problem List Patient Active Problem List   Diagnosis  Date Noted  . Sacroiliitis, not elsewhere classified ([place]) 12/22/2019  . Other secondary scoliosis, lumbar region 12/22/2019  . Arthritis of facet joint of lumbar spine 12/22/2019  . Cervical radiculopathy 10/18/2019  . Numbness 09/21/2019  . Bilateral carpal tunnel syndrome 09/21/2019  . High risk medication use 09/21/2019  . Memory loss 09/21/2019  . Essential hypertension 08/25/2018  . Abnormal SPEP 02/19/2018  . Hand pain 02/20/2017  . Multiple joint pain 02/18/2017  . Right elbow pain 12/09/2016  . Disturbed cognition 06/25/2016  . Trochanteric bursitis of left hip 02/18/2016  . Sciatica, right side 10/30/2015  . Bilateral arm pain 10/10/2015  . Trochanteric bursitis of right hip 08/04/2014  . Adjustment disorder with mixed anxiety and depressed mood 08/04/2014  . Chronic fatigue 08/04/2014  . Urinary frequency 08/04/2014  . Abnormality of gait 08/04/2014  . Unspecified visual disturbance 01/31/2013  . Transient vision disturbance 01/31/2013  . Routine general medical examination at a health care facility 11/04/2011  . Colon polyps 11/03/2011  . Hot  flashes, menopausal 10/13/2011  . POSTHERPETIC NEURALGIA 10/03/2009  . DISPLCMT LUMBAR INTERVERT [place] W/O MYELOPATHY 09/05/2009  . RENAL CALCULUS, RECURRENT 09/04/2009  . Hyperlipidemia 08/31/2009  . MULTIPLE SCLEROSIS, PROGRESSIVE/RELAPSING 08/31/2009  . ALLERGIC RHINITIS 08/31/2009    Carney Living 03/05/2020, 1:27 PM  [place], [place], [address] Phone: [phone]   Fax:  [phone]  Name: Dawn [name] MRN: [id number] Date of Birth: 1945-11-10

## 2020-03-07 DIAGNOSIS — Z23 Encounter for immunization: Secondary | ICD-10-CM | POA: Diagnosis not present

## 2020-03-08 ENCOUNTER — Telehealth: Payer: Self-pay | Admitting: Physical Medicine and Rehabilitation

## 2020-03-08 NOTE — Telephone Encounter (Signed)
Left message #1 to schedule bilateral L5-S1 RFA.

## 2020-03-14 ENCOUNTER — Ambulatory Visit
Admission: RE | Admit: 2020-03-14 | Discharge: 2020-03-14 | Disposition: A | Discharge status: Home / Self Care | Payer: Medicare Other | Source: Ambulatory Visit | Attending: Obstetrics and Gynecology | Admitting: Obstetrics and Gynecology

## 2020-03-14 ENCOUNTER — Other Ambulatory Visit: Payer: Self-pay

## 2020-03-14 DIAGNOSIS — Z1231 Encounter for screening mammogram for malignant neoplasm of breast: Secondary | ICD-10-CM | POA: Diagnosis not present

## 2020-03-16 ENCOUNTER — Encounter: Payer: Self-pay | Admitting: Internal Medicine

## 2020-03-16 ENCOUNTER — Telehealth: Payer: Self-pay | Admitting: Physical Medicine and Rehabilitation

## 2020-03-16 DIAGNOSIS — Z961 Presence of intraocular lens: Secondary | ICD-10-CM | POA: Diagnosis not present

## 2020-03-16 DIAGNOSIS — H35033 Hypertensive retinopathy, bilateral: Secondary | ICD-10-CM | POA: Diagnosis not present

## 2020-03-16 DIAGNOSIS — H35013 Changes in retinal vascular appearance, bilateral: Secondary | ICD-10-CM | POA: Diagnosis not present

## 2020-03-16 DIAGNOSIS — H35362 Drusen (degenerative) of macula, left eye: Secondary | ICD-10-CM | POA: Diagnosis not present

## 2020-03-16 NOTE — Telephone Encounter (Signed)
Patient called. She had missed a call to schedule with Dr Ernestina Patches. She would like a call back.

## 2020-03-16 NOTE — Telephone Encounter (Signed)
Patient states that she has not decided if she wants this procedure. She is going to discuss this with her PCP at an upcoming appointment, and she will let us know if she wants to schedule.

## 2020-03-16 NOTE — Telephone Encounter (Signed)
Left message #2

## 2020-03-16 NOTE — Telephone Encounter (Signed)
See previous message

## 2020-03-19 ENCOUNTER — Other Ambulatory Visit: Payer: Self-pay | Admitting: Internal Medicine

## 2020-03-19 DIAGNOSIS — I10 Essential (primary) hypertension: Secondary | ICD-10-CM

## 2020-03-21 ENCOUNTER — Ambulatory Visit: Payer: Medicare Other | Attending: Neurology | Admitting: Physical Therapy

## 2020-03-21 ENCOUNTER — Encounter: Payer: Self-pay | Admitting: Physical Therapy

## 2020-03-21 ENCOUNTER — Other Ambulatory Visit: Payer: Self-pay

## 2020-03-21 DIAGNOSIS — R262 Difficulty in walking, not elsewhere classified: Secondary | ICD-10-CM | POA: Insufficient documentation

## 2020-03-21 DIAGNOSIS — G8929 Other chronic pain: Secondary | ICD-10-CM | POA: Diagnosis not present

## 2020-03-21 DIAGNOSIS — M545 Low back pain: Secondary | ICD-10-CM | POA: Insufficient documentation

## 2020-03-21 DIAGNOSIS — M6281 Muscle weakness (generalized): Secondary | ICD-10-CM | POA: Insufficient documentation

## 2020-03-21 DIAGNOSIS — M542 Cervicalgia: Secondary | ICD-10-CM | POA: Insufficient documentation

## 2020-03-21 DIAGNOSIS — R252 Cramp and spasm: Secondary | ICD-10-CM | POA: Diagnosis not present

## 2020-03-21 NOTE — Therapy (Signed)
[place], [place], [address] Phone: [phone]   Fax:  [phone]  Physical Therapy Treatment/RPogress note   Patient Details  Name: Dawn [name] MRN: [id number] Date of Birth: [birth year] Referring Provider (PT): Dr Christell Constant  Progress Note Reporting Period 01/16/2020 to 03/21/2020  See note below for Objective Data and Assessment of Progress/Goals.       Encounter Date: 03/21/2020   PT End of Session - 03/21/20 1117    Visit Number 37    Number of Visits 45    Date for PT Re-Evaluation 05/16/20    Authorization Type progress not eperfromed on visit 46    PT Start Time 1050    PT Stop Time 1138    PT Time Calculation (min) 48 min    Activity Tolerance Patient tolerated treatment well    Behavior During Therapy WFL for tasks assessed/performed           Past Medical History:  Diagnosis Date  . Allergy   . Essential hypertension 08/25/2018  . Hot flashes, menopausal 10/13/2011   Estradiol started   . Memory loss 09/21/2019  . Multiple sclerosis ([place])   . Neuropathy   . Osteoarthritis   . Vision abnormalities     Past Surgical History:  Procedure Laterality Date  . ABDOMINAL HYSTERECTOMY  2002   partial  . LUMBAR FUSION      There were no vitals filed for this visit.   Subjective Assessment - 03/21/20 1050    Subjective Patient reports the area that had the injection helped but her hips are hurting her significanlty over the past few weeks.    How long can you stand comfortably? hurts as soon as she stands. Has to shift frequently    How long can you walk comfortably? limited community ambualtion    Diagnostic tests Nothing recent    Currently in Pain? Yes    Pain Score 6     Pain Location Hip    Pain Orientation Right    Pain Descriptors / Indicators Aching    Pain Type Chronic pain    Pain Radiating Towards into the burttock    Pain Onset More than a month ago    Pain  Frequency Constant    Aggravating Factors  standing and walking    Pain Relieving Factors rest and stretching    Effect of Pain on Daily Activities difficulty    Multiple Pain Sites No    Pain Score 7    Pain Location Hip    Pain Orientation Left    Pain Descriptors / Indicators Aching    Pain Type Chronic pain    Pain Onset More than a month ago    Pain Frequency Constant    Aggravating Factors  none    Pain Relieving Factors rest              [facility] PT Assessment - 03/21/20 0001      Assessment   Medical Diagnosis Right Sided Low Back Pain     Referring Provider (PT) Dr Christell Constant      AROM   Cervical - Right Rotation 70    Cervical - Left Rotation 75      Strength   Right Shoulder Flexion 4+/5    Right Shoulder Internal Rotation 4+/5    Right Shoulder External Rotation 4+/5    Left Shoulder Flexion 4+/5    Left Shoulder External Rotation 4+/5  Transfers   Comments sit to stand tst 5x 18 seconds       Ambulation/Gait   Gait Comments walking spped test 5 M 8,8, 9  Sec which indicated slow community speed                         [facility] Adult PT Treatment/Exercise - 03/21/20 0001      Lumbar Exercises: Stretches   Active Hamstring Stretch Limitations reviewd seated 3x20 sec hold each leg     Lower Trunk Rotation Limitations x20    Piriformis Stretch 2 reps;20 seconds      Lumbar Exercises: Seated   Other Seated Lumbar Exercises LAQ x210 bilateral ; hamstring curl yellow x20     Other Seated Lumbar Exercises xseated clamshell x20       Manual Therapy   Manual therapy comments skilled palpation of trigger points     Soft tissue mobilization to lumbar spine and right upper gluteals; rolling of bilateral gluteals and IT bands    Passive ROM focus on left hip hip flexionIR/ER     Manual Traction LAD Grade II-III            Trigger Point Dry Needling - 03/21/20 0001    Consent Given? Yes    Education Handout Provided Previously provided     Muscles Treated Back/Hip Piriformis    Dry Needling Comments 2 needles to each gluteal 2 to left piriformis using a .30x100     Gluteus Medius Response Twitch response elicited    Piriformis Response Twitch response elicited                PT Education - 03/21/20 1052    Education Details HEP and symptom mangement    Person(s) Educated Patient    Methods Explanation;Demonstration;Tactile cues;Verbal cues    Comprehension Verbalized understanding;Returned demonstration;Verbal cues required;Tactile cues required            PT Short Term Goals - 01/30/20 1319      PT SHORT TERM GOAL #1   Title Patient will report no pain radiating into anterior thigh    Baseline ino radiucalr pain at this time    Time 4    Period Weeks    Status Achieved      PT SHORT TERM GOAL #2   Title Patient will increase bilateral LE strength to 4/5    Baseline 4+/5 gross    Time 4    Period Weeks    Status Achieved    Target Date 05/24/19      PT SHORT TERM GOAL #3   Title Patient will increase passive right hip flexion to 105 degrrees    Baseline 100 today with pain onthe right side    Time 4    Period Weeks    Status On-going    Target Date 02/13/20      PT SHORT TERM GOAL #4   Title Patient will increase right cervical rotation by 10 degrees    Baseline to 58 degres today    Time 4    Period Weeks    Status On-going    Target Date 01/09/20      PT SHORT TERM GOAL #5   Title Patient will increase ross bilateral UE strength to 4+/5    Baseline improved to 4/5    Time 4    Period Weeks    Status Revised    Target Date 02/13/20  PT Long Term Goals - 01/30/20 1317      PT LONG TERM GOAL #1   Title Patient will stand for 30 min without self reported increase pain in order to stand at a kitchen sink.    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     Baseline pain at edn range hip flexion and ir but intensity decreased    Time 6    Period Weeks    Status On-going    Target Date 02/27/20      PT LONG TERM GOAL #4   Title Patient will reach behind his head without pain in order to perfrom ADL's    Baseline mild pain and tightness but improved    Time 6    Period Weeks    Status On-going    Target Date 01/23/20      PT LONG TERM GOAL #5   Title Patient will report no radicular pain into her hands    Baseline more intermittent    Time 6    Period Weeks    Status On-going    Target Date 01/23/20      PT LONG TERM GOAL #6   Title POatient will improve ABC score to 50% function in order to decrease her risk of fall    Baseline 40%    Period Weeks    Status On-going    Target Date 01/23/20                 Plan - 03/21/20 1151    Clinical Impression Statement Patient cointinues to do well. She has had several exacerbations of symptoms during the current reporting period but she has been able to return to baseline mobility through manual therapy and progressive strengthening. She continues to [employer] on balance at home as well as strengthening. She feels like her endurance is improving. She is not having to sit down as much during daily tasks. Her sit to stand transfer time puts her at a fall risk. We will continue to work on functional strengthening and mobility. When patient misses a few weeks of therapy she has a noticble increase in antalgic gait and increase in pain. Therapy has been able to help her maintain a baseline leevel of function and pain controil with her progressive neurolgical disorder. She  would benefit from further therapy 1W8. Therapy perfromed dry nedling to the prifiromis on the left todsay. She reported improved pain with treatment. She also perfromed light ther-ex. Therapy reviewed paln going forward.    Personal Factors and Comorbidities Comorbidity 1;Comorbidity 2    Comorbidities MS, OA    Examination-Activity Limitations Locomotion Level;Carry;Stand;Lift;Stairs;Squat    Examination-Participation Restrictions Meal Prep;Cleaning;Community Activity;Laundry;Shop    Stability/Clinical Decision Making Stable/Uncomplicated    Clinical Decision Making Low    Rehab Potential Poor    PT Frequency 1x / week  PT Duration 8 weeks    PT Treatment/Interventions ADLs/Self Care Home Management;Electrical Stimulation;Cryotherapy;Iontophoresis 4mg /ml Dexamethasone;Moist Heat;Traction;DME Instruction;Neuromuscular re-education;Patient/family education;Manual techniques;Passive range of motion;Taping    PT Next Visit Plan work on balance with patient,    PT Home Exercise Plan LTR; hamstring stretch, tennis ball trigger point release ; abdominal breathing;Access Code: [email or web address].Seated Upper Trapezius Stretch - 1 x daily - 7 x weekly - 1 sets - 3 reps - 20 hold.Gentle Levator Scapulae Stretch - 1 x daily - 7 x weekly - 1 sets - 3 reps - 20 hold.Shoulder extension with resistance - Neutral - 1 x daily - 7 x weekly - 3 sets - 10 reps.Scapular Retraction with Resistance - 1 x daily - 7 x weekly - 3 sets - 10 reps    Consulted and Agree with Plan of Care Patient           Patient will benefit from skilled therapeutic intervention in order to improve the following deficits and impairments:  Abnormal gait, Difficulty walking, Decreased range of motion, Decreased mobility, Decreased strength, Postural dysfunction, Pain, Increased muscle spasms  Visit Diagnosis: Chronic bilateral low back pain without sciatica - Plan: PT plan of care cert/re-cert  Muscle weakness (generalized) - Plan: PT  plan of care cert/re-cert  Cramp and spasm - Plan: PT plan of care cert/re-cert  Difficulty in walking, not elsewhere classified - Plan: PT plan of care cert/re-cert  Cervicalgia - Plan: PT plan of care cert/re-cert     Problem List Patient Active Problem List   Diagnosis Date Noted  . Sacroiliitis, not elsewhere classified ([place]) 12/22/2019  . Other secondary scoliosis, lumbar region 12/22/2019  . Arthritis of facet joint of lumbar spine 12/22/2019  . Cervical radiculopathy 10/18/2019  . Numbness 09/21/2019  . Bilateral carpal tunnel syndrome 09/21/2019  . High risk medication use 09/21/2019  . Memory loss 09/21/2019  . Essential hypertension 08/25/2018  . Abnormal SPEP 02/19/2018  . Hand pain 02/20/2017  . Multiple joint pain 02/18/2017  . Right elbow pain 12/09/2016  . Disturbed cognition 06/25/2016  . Trochanteric bursitis of left hip 02/18/2016  . Sciatica, right side 10/30/2015  . Bilateral arm pain 10/10/2015  . Trochanteric bursitis of right hip 08/04/2014  . Adjustment disorder with mixed anxiety and depressed mood 08/04/2014  . Chronic fatigue 08/04/2014  . Urinary frequency 08/04/2014  . Abnormality of gait 08/04/2014  . Unspecified visual disturbance 01/31/2013  . Transient vision disturbance 01/31/2013  . Routine general medical examination at a health care facility 11/04/2011  . Colon polyps 11/03/2011  . Hot flashes, menopausal 10/13/2011  . POSTHERPETIC NEURALGIA 10/03/2009  . DISPLCMT LUMBAR INTERVERT [place] W/O MYELOPATHY 09/05/2009  . RENAL CALCULUS, RECURRENT 09/04/2009  . Hyperlipidemia 08/31/2009  . MULTIPLE SCLEROSIS, PROGRESSIVE/RELAPSING 08/31/2009  . ALLERGIC RHINITIS 08/31/2009    Carney Living PT DPT  03/21/2020, 1:31 PM  [facility] [address] [place], [place], [address] Phone: [phone]   Fax:  [phone]  Name: Dawn [name] MRN: [id number] Date of Birth: 04-26-1946

## 2020-03-28 ENCOUNTER — Encounter: Payer: Self-pay | Admitting: Physical Therapy

## 2020-03-28 ENCOUNTER — Ambulatory Visit: Payer: Medicare Other | Admitting: Physical Therapy

## 2020-03-28 ENCOUNTER — Other Ambulatory Visit: Payer: Self-pay

## 2020-03-28 DIAGNOSIS — R262 Difficulty in walking, not elsewhere classified: Secondary | ICD-10-CM | POA: Diagnosis not present

## 2020-03-28 DIAGNOSIS — M545 Low back pain, unspecified: Secondary | ICD-10-CM

## 2020-03-28 DIAGNOSIS — R252 Cramp and spasm: Secondary | ICD-10-CM | POA: Diagnosis not present

## 2020-03-28 DIAGNOSIS — M542 Cervicalgia: Secondary | ICD-10-CM

## 2020-03-28 DIAGNOSIS — G8929 Other chronic pain: Secondary | ICD-10-CM

## 2020-03-28 DIAGNOSIS — M6281 Muscle weakness (generalized): Secondary | ICD-10-CM

## 2020-03-28 NOTE — Therapy (Signed)
[place], [place], [address] Phone: [phone]   Fax:  [phone]  Physical Therapy Treatment  Patient Details  Name: Dawn [name] MRN: [id number] Date of Birth: [birth year] Referring Provider (PT): Dr Christell Constant   Encounter Date: 03/28/2020   PT End of Session - 03/28/20 1023    Visit Number 38    Number of Visits 45    Authorization Type progress not eperfromed on visit 37 next on 57    PT Start Time 1015    PT Stop Time 1058    PT Time Calculation (min) 43 min    Activity Tolerance Patient tolerated treatment well    Behavior During Therapy [facility] for tasks assessed/performed           Past Medical History:  Diagnosis Date  . Allergy   . Essential hypertension 08/25/2018  . Hot flashes, menopausal 10/13/2011   Estradiol started   . Memory loss 09/21/2019  . Multiple sclerosis ([place])   . Neuropathy   . Osteoarthritis   . Vision abnormalities     Past Surgical History:  Procedure Laterality Date  . ABDOMINAL HYSTERECTOMY  2002   partial  . LUMBAR FUSION      There were no vitals filed for this visit.   Subjective Assessment - 03/28/20 1021    Subjective Patient reports her hips are not as bad as they have been. She has been achy. She has not been able to go to the pool.    Pertinent History MS    How long can you stand comfortably? hurts as soon as she stands. Has to shift frequently    How long can you walk comfortably? limited community ambualtion    Diagnostic tests Nothing recent    Currently in Pain? Yes    Pain Score 4     Pain Location Back    Pain Orientation Right;Left    Pain Descriptors / Indicators Aching    Pain Type Chronic pain    Pain Onset More than a month ago    Pain Frequency Constant    Aggravating Factors  standing and walking    Pain Relieving Factors rest and stretching    Effect of Pain on Daily Activities difficulty perfroming ADL's                              [facility] Adult PT Treatment/Exercise - 03/28/20 0001      Neck Exercises: Standing   Other Standing Exercises standing march 2x10; standing heel raise 2x10       Neck Exercises: Seated   Other Seated Exercise bilateral ER 2x10 red; horizontal abduction 2x10 red; seated band flexion 2x10;       Lumbar Exercises: Stretches   Active Hamstring Stretch Limitations reviewd seated 3x20 sec hold each leg       Manual Therapy   Soft tissue mobilization IASTYM to upper bilateral traps    Passive ROM focus on left hip hip flexionIR/ER     Manual Traction LAD Grade II-III                  PT Education - 03/28/20 1022    Education Details reviewedHEP and symptom mangement    Person(s) Educated Patient    Methods Explanation;Demonstration;Tactile cues;Verbal cues    Comprehension Returned demonstration;Tactile cues required;Verbalized understanding;Verbal cues required            PT  Short Term Goals - 03/28/20 1446      PT SHORT TERM GOAL #1   Title Patient will report no pain radiating into anterior thigh    Baseline no    Time 4    Period Weeks    Status Achieved    Target Date 12/09/19      PT SHORT TERM GOAL #2   Title Patient will increase bilateral LE strength to 4/5    Baseline 4+/5 gross    Time 4    Period Weeks    Status Achieved      PT SHORT TERM GOAL #3   Title Patient will increase passive right hip flexion to 105 degrrees    Baseline 100 today with pain onthe right side    Time 4    Period Weeks    Status On-going      PT SHORT TERM GOAL #4   Title Patient will increase right cervical rotation by 10 degrees    Baseline to 58 degres today    Time 4    Period Weeks    Status On-going    Target Date 01/09/20      PT SHORT TERM GOAL #5   Title Patient will increase ross bilateral UE strength to 4+/5    Baseline improved to 4/5    Time 4    Period Weeks    Status On-going             PT Long Term Goals -  01/30/20 1317      PT LONG TERM GOAL #1   Title Patient will stand for 30 min without self reported increase pain in order to stand at a kitchen sink.    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     Baseline pain at edn range hip flexion and ir but intensity decreased    Time 6    Period Weeks    Status On-going    Target Date 02/27/20      PT LONG TERM GOAL #4   Title Patient will reach behind his head without pain in order to perfrom ADL's    Baseline mild pain and tightness but improved    Time 6    Period Weeks    Status On-going    Target Date 01/23/20      PT LONG TERM GOAL #5   Title Patient will report no radicular pain into her hands    Baseline more intermittent    Time 6    Period Weeks    Status On-going    Target Date 01/23/20      PT LONG TERM GOAL #6   Title POatient will improve ABC score to 50% function in order to decrease her risk of fall    Baseline 40%    Period Weeks    Status On-going  Target Date 01/23/20                 Plan - 03/28/20 1046    Clinical Impression Statement Patients spasming in her hips was improved tofday. she hads more upper lumbar tightness which therapy focused on. Therapy reviewed upper and lower extremity exercises. She was also able tpo complete standing exercises. If hip pain remains low we will progress standing balance exercises.    Personal Factors and Comorbidities Comorbidity 1;Comorbidity 2    Comorbidities MS, OA    Examination-Activity Limitations Locomotion  Level;Carry;Stand;Lift;Stairs;Squat    Examination-Participation Restrictions Meal Prep;Cleaning;Community Activity;Laundry;Shop    Stability/Clinical Decision Making Stable/Uncomplicated    Rehab Potential Poor    PT Treatment/Interventions ADLs/Self Care Home Management;Electrical Stimulation;Cryotherapy;Iontophoresis 4mg /ml Dexamethasone;Moist Heat;Traction;DME Instruction;Neuromuscular re-education;Patient/family education;Manual techniques;Passive range of motion;Taping    PT Next Visit Plan work on balance with patient,    PT Home Exercise Plan LTR; hamstring stretch, tennis ball trigger point release ; abdominal breathing;Access Code: [email or web address].Seated Upper Trapezius Stretch - 1 x daily - 7 x weekly - 1 sets - 3 reps - 20 hold.Gentle Levator Scapulae Stretch - 1 x daily - 7 x weekly - 1 sets - 3 reps - 20 hold.Shoulder extension with resistance - Neutral - 1 x daily - 7 x weekly - 3 sets - 10 reps.Scapular Retraction with Resistance - 1 x daily - 7 x weekly - 3 sets - 10 reps    Consulted and Agree with Plan of Care Patient           Patient will benefit from skilled therapeutic intervention in order to improve the following deficits and impairments:  Abnormal gait, Difficulty walking, Decreased range of motion, Decreased mobility, Decreased strength, Postural dysfunction, Pain, Increased muscle spasms  Visit Diagnosis: Chronic bilateral low back pain without sciatica  Muscle weakness (generalized)  Cramp and spasm  Difficulty in walking, not elsewhere classified  Cervicalgia     Problem List Patient Active Problem List   Diagnosis Date Noted  . Sacroiliitis, not elsewhere classified ([place]) 12/22/2019  . Other secondary scoliosis, lumbar region 12/22/2019  . Arthritis of facet joint of lumbar spine 12/22/2019  . Cervical radiculopathy 10/18/2019  . Numbness 09/21/2019  . Bilateral carpal tunnel syndrome 09/21/2019  . High risk medication use 09/21/2019  . Memory  loss 09/21/2019  . Essential hypertension 08/25/2018  . Abnormal SPEP 02/19/2018  . Hand pain 02/20/2017  . Multiple joint pain 02/18/2017  . Right elbow pain 12/09/2016  . Disturbed cognition 06/25/2016  . Trochanteric bursitis of left hip 02/18/2016  . Sciatica, right side 10/30/2015  . Bilateral arm pain 10/10/2015  . Trochanteric bursitis of right hip 08/04/2014  . Adjustment disorder with mixed anxiety and depressed mood 08/04/2014  . Chronic fatigue 08/04/2014  . Urinary frequency 08/04/2014  . Abnormality of gait 08/04/2014  . Unspecified visual disturbance 01/31/2013  . Transient vision disturbance 01/31/2013  . Routine general medical examination at a health care facility 11/04/2011  . Colon polyps 11/03/2011  . Hot flashes, menopausal 10/13/2011  . POSTHERPETIC NEURALGIA 10/03/2009  . DISPLCMT LUMBAR INTERVERT [place] W/O MYELOPATHY 09/05/2009  . RENAL CALCULUS, RECURRENT 09/04/2009  . Hyperlipidemia 08/31/2009  . MULTIPLE SCLEROSIS, PROGRESSIVE/RELAPSING 08/31/2009  . ALLERGIC RHINITIS 08/31/2009    Carney Living PT DPT  03/28/2020, 2:47 PM  [facility] [address] [place], [place], [address] Phone: [phone]   Fax:  [phone]  Name: DINESHA TWIGGS MRN: [id number] Date of Birth: May 02, 1946

## 2020-04-02 ENCOUNTER — Encounter: Payer: Self-pay | Admitting: Physical Therapy

## 2020-04-02 ENCOUNTER — Other Ambulatory Visit: Payer: Self-pay

## 2020-04-02 ENCOUNTER — Ambulatory Visit: Payer: Medicare Other | Admitting: Physical Therapy

## 2020-04-02 DIAGNOSIS — G8929 Other chronic pain: Secondary | ICD-10-CM | POA: Diagnosis not present

## 2020-04-02 DIAGNOSIS — R262 Difficulty in walking, not elsewhere classified: Secondary | ICD-10-CM | POA: Diagnosis not present

## 2020-04-02 DIAGNOSIS — R252 Cramp and spasm: Secondary | ICD-10-CM | POA: Diagnosis not present

## 2020-04-02 DIAGNOSIS — M6281 Muscle weakness (generalized): Secondary | ICD-10-CM | POA: Diagnosis not present

## 2020-04-02 DIAGNOSIS — M542 Cervicalgia: Secondary | ICD-10-CM

## 2020-04-02 DIAGNOSIS — M545 Low back pain: Secondary | ICD-10-CM | POA: Diagnosis not present

## 2020-04-02 NOTE — Therapy (Signed)
[place], [place], [address] Phone: [phone]   Fax:  [phone]  Physical Therapy Treatment  Patient Details  Name: Dawn [name] MRN: [id number] Date of Birth: 1946-04-01 Referring Provider (PT): Dr Christell Constant   Encounter Date: 04/02/2020   PT End of Session - 04/02/20 1325    Visit Number 39    Number of Visits 45    Date for PT Re-Evaluation 05/16/20    Authorization Type progress not eperfromed on visit 37 next on 47    PT Start Time 1015    PT Stop Time 1055    PT Time Calculation (min) 40 min    Activity Tolerance Patient tolerated treatment well    Behavior During Therapy [facility] for tasks assessed/performed           Past Medical History:  Diagnosis Date  . Allergy   . Essential hypertension 08/25/2018  . Hot flashes, menopausal 10/13/2011   Estradiol started   . Memory loss 09/21/2019  . Multiple sclerosis ([place])   . Neuropathy   . Osteoarthritis   . Vision abnormalities     Past Surgical History:  Procedure Laterality Date  . ABDOMINAL HYSTERECTOMY  2002   partial  . LUMBAR FUSION      There were no vitals filed for this visit.   Subjective Assessment - 04/02/20 1024    Subjective Patient reports her weekend has gone pretty well.    Pertinent History MS    How long can you stand comfortably? hurts as soon as she stands. Has to shift frequently    How long can you walk comfortably? limited community ambualtion    Diagnostic tests Nothing recent    Currently in Pain? Yes    Pain Score 3     Pain Location Back    Pain Orientation Right;Left    Pain Descriptors / Indicators Aching    Pain Type Chronic pain    Pain Onset More than a month ago    Pain Frequency Constant    Aggravating Factors  standing and walking    Pain Relieving Factors rest and stretching    Effect of Pain on Daily Activities difficuty perfroming ADL's                              [facility] Adult PT Treatment/Exercise - 04/02/20 0001      Neuro Re-ed    Neuro Re-ed Details  narrow base on sair-ex 3x45 dsec hold eyes closed mod a on air-ex 3x1 min;       Lumbar Exercises: Stretches   Active Hamstring Stretch Limitations seated 3x20 sec hold each leg     Lower Trunk Rotation Limitations x20    Piriformis Stretch 2 reps;20 seconds      Lumbar Exercises: Standing   Heel Raises Limitations x20 on air-ex with insturction to catch herself going backwards     Functional Squats 15 reps    Other Standing Lumbar Exercises step onto air-ex x10 each leg side to ide and forward back     Other Standing Lumbar Exercises keelt ebell lift from 14 inch step 10 lbs       Lumbar Exercises: Supine   Clam Limitations supine red band 2x10       Manual Therapy   Soft tissue mobilization roller to bilateral gluteals and IT bands     Manual Traction LAD Grade II-III  PT Short Term Goals - 03/28/20 1446      PT SHORT TERM GOAL #1   Title Patient will report no pain radiating into anterior thigh    Baseline no    Time 4    Period Weeks    Status Achieved    Target Date 12/09/19      PT SHORT TERM GOAL #2   Title Patient will increase bilateral LE strength to 4/5    Baseline 4+/5 gross    Time 4    Period Weeks    Status Achieved      PT SHORT TERM GOAL #3   Title Patient will increase passive right hip flexion to 105 degrrees    Baseline 100 today with pain onthe right side    Time 4    Period Weeks    Status On-going      PT SHORT TERM GOAL #4   Title Patient will increase right cervical rotation by 10 degrees    Baseline to 58 degres today    Time 4    Period Weeks    Status On-going    Target Date 01/09/20      PT SHORT TERM GOAL #5   Title Patient will increase ross bilateral UE strength to 4+/5    Baseline improved to 4/5    Time 4    Period Weeks    Status On-going             PT Long Term Goals - 01/30/20 1317      PT  LONG TERM GOAL #1   Title Patient will stand for 30 min without self reported increase pain in order to stand at a kitchen sink.    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     Baseline pain at edn range hip flexion and ir but intensity decreased    Time 6    Period Weeks    Status On-going    Target Date 02/27/20      PT LONG TERM GOAL #4   Title Patient will reach behind his head without pain in order to perfrom ADL's    Baseline mild pain and tightness but improved    Time 6    Period Weeks    Status On-going    Target Date 01/23/20      PT LONG TERM GOAL #5   Title Patient will report no radicular pain into her hands    Baseline more intermittent    Time 6    Period Weeks    Status On-going    Target Date 01/23/20      PT LONG TERM GOAL #6   Title POatient will improve ABC score to 50% function in order to decrease her risk of fall    Baseline 40%    Period Weeks    Status On-going  Target Date 01/23/20                 Plan - 04/02/20 1325    Clinical Impression Statement Patient continues to be able to tolerate increased standing balance and strnegthening activity. Therapy added in kettle bell lifts to continue to work on progressive strengthening. She had increased balance deficits with eyes closed activity. Therapy will continue to advance fiunctional strengthening as tolerated.    Personal Factors and Comorbidities Comorbidity 1;Comorbidity 2    Examination-Activity Limitations Locomotion  Level;Carry;Stand;Lift;Stairs;Squat    Examination-Participation Restrictions Meal Prep;Cleaning;Community Activity;Laundry;Shop    Stability/Clinical Decision Making Stable/Uncomplicated    Clinical Decision Making Low    Rehab Potential Poor    PT Frequency 1x / week    PT Duration 8 weeks    PT Treatment/Interventions ADLs/Self Care Home Management;Electrical Stimulation;Cryotherapy;Iontophoresis 4mg /ml Dexamethasone;Moist Heat;Traction;DME Instruction;Neuromuscular re-education;Patient/family education;Manual techniques;Passive range of motion;Taping    PT Next Visit Plan work on balance with patient,    PT Home Exercise Plan LTR; hamstring stretch, tennis ball trigger point release ; abdominal breathing;Access Code: [email or web address].Seated Upper Trapezius Stretch - 1 x daily - 7 x weekly - 1 sets - 3 reps - 20 hold.Gentle Levator Scapulae Stretch - 1 x daily - 7 x weekly - 1 sets - 3 reps - 20 hold.Shoulder extension with resistance - Neutral - 1 x daily - 7 x weekly - 3 sets - 10 reps.Scapular Retraction with Resistance - 1 x daily - 7 x weekly - 3 sets - 10 reps    Consulted and Agree with Plan of Care Patient           Patient will benefit from skilled therapeutic intervention in order to improve the following deficits and impairments:  Abnormal gait, Difficulty walking, Decreased range of motion, Decreased mobility, Decreased strength, Postural dysfunction, Pain, Increased muscle spasms  Visit Diagnosis: Chronic bilateral low back pain without sciatica  Muscle weakness (generalized)  Cramp and spasm  Difficulty in walking, not elsewhere classified  Cervicalgia     Problem List Patient Active Problem List   Diagnosis Date Noted  . Sacroiliitis, not elsewhere classified ([place]) 12/22/2019  . Other secondary scoliosis, lumbar region 12/22/2019  . Arthritis of facet joint of lumbar spine 12/22/2019  . Cervical radiculopathy 10/18/2019  . Numbness 09/21/2019  . Bilateral  carpal tunnel syndrome 09/21/2019  . High risk medication use 09/21/2019  . Memory loss 09/21/2019  . Essential hypertension 08/25/2018  . Abnormal SPEP 02/19/2018  . Hand pain 02/20/2017  . Multiple joint pain 02/18/2017  . Right elbow pain 12/09/2016  . Disturbed cognition 06/25/2016  . Trochanteric bursitis of left hip 02/18/2016  . Sciatica, right side 10/30/2015  . Bilateral arm pain 10/10/2015  . Trochanteric bursitis of right hip 08/04/2014  . Adjustment disorder with mixed anxiety and depressed mood 08/04/2014  . Chronic fatigue 08/04/2014  . Urinary frequency 08/04/2014  . Abnormality of gait 08/04/2014  . Unspecified visual disturbance 01/31/2013  . Transient vision disturbance 01/31/2013  . Routine general medical examination at a health care facility 11/04/2011  . Colon polyps 11/03/2011  . Hot flashes, menopausal 10/13/2011  . POSTHERPETIC NEURALGIA 10/03/2009  . DISPLCMT LUMBAR INTERVERT [place] W/O MYELOPATHY 09/05/2009  . RENAL CALCULUS, RECURRENT 09/04/2009  . Hyperlipidemia 08/31/2009  . MULTIPLE SCLEROSIS, PROGRESSIVE/RELAPSING 08/31/2009  . ALLERGIC RHINITIS 08/31/2009    Carney Living PT DPT  04/02/2020, 1:31 PM   [place], [place], [address] Phone:  [phone]   Fax:  [phone]  Name: Dawn [name] MRN: [id number] Date of Birth: Aug 31, 1945

## 2020-04-04 ENCOUNTER — Encounter: Payer: Self-pay | Admitting: Neurology

## 2020-04-04 ENCOUNTER — Ambulatory Visit (INDEPENDENT_AMBULATORY_CARE_PROVIDER_SITE_OTHER): Payer: Medicare Other | Admitting: Neurology

## 2020-04-04 VITALS — BP 171/86 | HR 74 | Ht 62.0 in | Wt 178.0 lb

## 2020-04-04 DIAGNOSIS — M2550 Pain in unspecified joint: Secondary | ICD-10-CM | POA: Diagnosis not present

## 2020-04-04 DIAGNOSIS — G35 Multiple sclerosis: Secondary | ICD-10-CM | POA: Diagnosis not present

## 2020-04-04 DIAGNOSIS — Z79899 Other long term (current) drug therapy: Secondary | ICD-10-CM

## 2020-04-04 DIAGNOSIS — E559 Vitamin D deficiency, unspecified: Secondary | ICD-10-CM

## 2020-04-04 DIAGNOSIS — R5382 Chronic fatigue, unspecified: Secondary | ICD-10-CM

## 2020-04-04 DIAGNOSIS — R269 Unspecified abnormalities of gait and mobility: Secondary | ICD-10-CM

## 2020-04-04 MED ORDER — LEFLUNOMIDE 20 MG PO TABS
20.0000 mg | ORAL_TABLET | Freq: Every day | ORAL | 4 refills | Status: DC
Start: 2020-04-04 — End: 2020-10-02

## 2020-04-04 MED ORDER — MODAFINIL 200 MG PO TABS
ORAL_TABLET | ORAL | 1 refills | Status: DC
Start: 2020-04-04 — End: 2020-05-28

## 2020-04-04 MED ORDER — CLONAZEPAM 0.5 MG PO TABS
ORAL_TABLET | ORAL | 1 refills | Status: DC
Start: 2020-04-04 — End: 2020-09-25

## 2020-04-04 MED ORDER — TRAMADOL HCL 50 MG PO TABS: 50.0000 mg | ORAL_TABLET | Freq: Every day | ORAL | 5 refills | Status: DC

## 2020-04-04 NOTE — Progress Notes (Signed)
[facility]  PATIENT: Dawn [name] DOB: [birth year]  REFERRING CLINICIAN: Aura Dials HISTORY FROM: Patient REASON FOR VISIT: MS   HISTORICAL  CHIEF COMPLAINT:  Chief Complaint  Patient presents with  . Follow-up    pt alone, rm 13. presents as follow up. no major concerns.     HISTORY OF PRESENT ILLNESS:  Dawn [name] is a 74 y.o. woman who was diagnosed with multiple sclerosis in 2001.      Update 04/04/2020: Her MS is stable without exacerbations.    She is on Leflunomide [employer] had not covered [place] well for her in the past.).  She tolerates it well.    She does have a ental abscess right now.    She is doing PT and swimming once a week.    Her gait is reduced but no falls.    Balance is better since PT.    She notes mildly reduced, but improved , strength in legs.   She is noting numbness but less pain in her hands.  At night, she gets painful tingling in her legs.  She drops things at times.   She will be seeing Dr. Estanislado Pandy for her arthritis.     Pain is worse at night but still bothers her during the day.   In the past, gabapentin had been tried but she does not recall if there was any benefit.   Tramadol had helped her pain in the past but she rarely takes as it makes her sleepy.       Clonazepam is not helping insomnia and spasticity at night as much as it used to.   She gets throbbing in her limbs at night.   She sleeps 3 solid hours of sleep nightly then intermittently more sleep.  She feels her cognition is about the same as last visit.        She has had more memory difficulties and is forgetful.    She is noting anxiety.   She is on clonazepam at night.   She has fatigue and takes 1 Provigil every morning and sometimes another 1/2 pill in the afternoon.   She has had 2 more ESI's (Dr. Ernestina Patches).  Her last ESI did not help the LBP/leg pain long term.   She will be getting an RFA (?).   PT with needling has helped.   She also does [place]  _______________________________________________ MS History:    In 1994, she had an episode of severe vertigo with gait ataxia. An MRI at that time was reportedly normal. The symptoms were felt to be due to allergies. About 7 years later she had vertigo, gait ataxia and diplopia. Also her left leg was giving out. Short after that she had a hysterectomy. Postoperatively, she was numb in both legs and she had a lumbar puncture which showed changes consistent with MS. Then, an MRI of the brain was performed showing changes consistent with MS. She was referred to Dr. Erling Cruz who her on Betaseron. Last year, she switched from Betaseron to Tecfidera, in the hope that she would be able to avoid shots as she was having severe skin reactions with knots.    However, she had a lot of difficulty tolerating Tecfidera and swithced to [place] in early 2016 and to [place] late 2016.  Switched to Leflunomide for insurance reasons in 2019.    MRI brain 09/13/2018 shows multiple T2/flair hyperintense foci in the hemispheres.  The pattern is consistent with chronic demyelinating plaque  associated with multiple sclerosis though some foci likely also represent chronic microvascular ischemic change.  None of the foci enhance or appear to be acute and there is no change compared to the 2017 MRI.      Remote left cerebellar infarction.      There is a normal enhancement pattern and no acute findings.  REVIEW OF SYSTEMS:  Constitutional: No fevers, chills, sweats, or change in appetite.   Reports fatigue Eyes: No visual changes, double vision, eye pain Ear, nose and throat: No hearing loss, ear pain, nasal congestion, sore throat Cardiovascular: No chest pain, palpitations Respiratory:  No shortness of breath at rest or with exertion.   No wheezes GastrointestinaI: No nausea, vomiting, diarrhea, abdominal pain, fecal incontinence Genitourinary:  see above. Musculoskeletal:  Pain in left greater than right hip Integumentary:  No rash, pruritus.    Neurological: as above Psychiatric: No depression at this time.  No anxiety Endocrine: No palpitations, diaphoresis, change in appetite, change in weigh or increased thirst Hematologic/Lymphatic:  No anemia, purpura, petechiae. Allergic/Immunologic: No itchy/runny eyes, nasal congestion, recent allergic reactions, rashes  ALLERGIES: No Known Allergies  HOME MEDICATIONS: Outpatient Medications Prior to Visit  Medication Sig Dispense Refill  . acetaminophen (TYLENOL) 500 MG tablet Take 2 tablets (1,000 mg total) by mouth in the morning and at bedtime. 30 tablet 0  . aspirin EC 81 MG tablet Take 81 mg by mouth daily.    . calcium carbonate (OS-CAL) 600 MG TABS Take 600 mg by mouth daily.    . clonazePAM (KLONOPIN) 0.5 MG tablet Take 1 tablet by mouth at bedtime 90 tablet 1  . estradiol (CLIMARA - DOSED IN MG/24 HR) 0.025 mg/24hr patch Place 0.025 mg onto the skin once a week.    . fexofenadine (ALLEGRA) 180 MG tablet Take 180 mg by mouth daily.    [name] Kitchen leflunomide (ARAVA) 20 MG tablet TAKE ONE TABLET BY MOUTH DAILY 90 tablet 4  . losartan (COZAAR) 25 MG tablet TAKE 1 TABLET BY MOUTH EVERY DAY 90 tablet 1  . Misc Natural Products (SUPER-D3+) 5000 units CAPS Take by mouth daily.    . modafinil (PROVIGIL) 200 MG tablet TAKE 1 TABLET EVERY MORNING AND ONE-HALF TABLET EVERY AFTERNOON 135 tablet 1  . omeprazole (PRILOSEC) 40 MG capsule Take 1 capsule (40 mg total) by mouth daily. 90 capsule 1  . Polyethylene Glycol 3350 (MIRALAX PO) Take by mouth daily as needed.     . progesterone (PROMETRIUM) 100 MG capsule TAKE 1 CAPSULE BY MOUTH EVERY DAY AT BEDTIME  3  . solifenacin (VESICARE) 5 MG tablet TAKE 1 TABLET BY MOUTH EVERY DAY 90 tablet 1  . traMADol (ULTRAM) 50 MG tablet Take 1 tablet (50 mg total) by mouth at bedtime. 30 tablet 5   No facility-administered medications prior to visit.    PAST MEDICAL HISTORY: Past Medical History:  Diagnosis Date  . Allergy   . Essential  hypertension 08/25/2018  . Hot flashes, menopausal 10/13/2011   Estradiol started   . Memory loss 09/21/2019  . Multiple sclerosis ([place])   . Neuropathy   . Osteoarthritis   . Vision abnormalities     PAST SURGICAL HISTORY: Past Surgical History:  Procedure Laterality Date  . ABDOMINAL HYSTERECTOMY  2002   partial  . LUMBAR FUSION      FAMILY HISTORY: Family History  Problem Relation Age of Onset  . Heart failure Mother   . Heart failure Father   . Arthritis Other   .  Hypertension Other     SOCIAL HISTORY:  Social History   Socioeconomic History  . Marital status: Widowed    Spouse name: Not on file  . Number of children: Not on file  . Years of education: Not on file  . Highest education level: Not on file  Occupational History  . Occupation: retired  Tobacco Use  . Smoking status: Former Smoker    Packs/day: 0.50    Years: 15.00    Pack years: 7.50    Types: Cigarettes    Quit date: 08/05/1983    Years since quitting: 36.6  . Smokeless tobacco: Never Used  Vaping Use  . Vaping Use: Never used  Substance and Sexual Activity  . Alcohol use: Yes    Alcohol/week: 0.0 standard drinks    Comment: rarely  . Drug use: No  . Sexual activity: Not Currently    Birth control/protection: Post-menopausal  Other Topics Concern  . 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     Tobacco use, amount per day now: former   Past tobacco use, amount per day: 1/2-1 packet   How many years did you use tobacco: 12 years   Alcohol use (drinks per week): 0   Diet: mediterranean based   Do you drink/eat things with caffeine: yes   Marital status:        widow                          What year were you married? 1987   Do you live in a house, apartment,  assisted living, condo, trailer, etc.? house   Is it one or more stories? 1 story   How many persons live in your home? 1   Do you have pets in your home?( please list) no   Current or past profession: Science writer    Do you exercise?          yes                        Type and how often? Tai chi and pt therapy, stretches etc....   Do you have a living will? yes   Do you have a DNR form?      no                             If not, do you want to discuss one? yes   Do you have signed POA/HPOA forms?      no                  If so, please bring to you appointment   Social Determinants of Health   Financial Resource Strain:   . Difficulty of Paying Living Expenses: Not on file  Food Insecurity:   . Worried About Charity fundraiser in the Last Year: Not on file  . Ran Out of Food in the Last Year: Not on file  Transportation Needs:   . Lack of Transportation (Medical): Not on file  . Lack of Transportation (Non-Medical): Not on file  Physical  Activity:   . Days of Exercise per Week: Not on file  . Minutes of Exercise per Session: Not on file  Stress:   . Feeling of Stress : Not on file  Social Connections:   . Frequency of Communication with Friends and Family: Not on file  . Frequency of Social Gatherings with Friends and Family: Not on file  . Attends Religious Services: Not on file  . Active Member of Clubs or Organizations: Not on file  . Attends Archivist Meetings: Not on file  . Marital Status: Not on file  Intimate Partner Violence:   . Fear of Current or Ex-Partner: Not on file  . Emotionally Abused: Not on file  . Physically Abused: Not on file  . Sexually Abused: Not on file     PHYSICAL EXAM  Vitals:   04/04/20 1059  BP: (!) 171/86  Pulse: 74  Weight: 178 lb (80.7 kg)  Height: '5\' 2"'  (1.575 m)    Body mass index is 32.56 kg/m.   General: The patient is well-developed and well-nourished and in no acute  distress  Musculoskeletal: She has mild trochanteric bursa tenderness, bilaterally.  Mild lower lumbar paraspinal tenderness.  Neurologic Exam  Mental status: The patient is alert and oriented x 3 at the time of the examination.   apparently normal attention span, stm and concentration ability.   Speech is normal.      Cranial nerves: Extraocular movements are full.  Facial strength and sensory are normal.  Hearing appeared to be symmetric  Motor:  Muscle bulk is normal but tone is symmetric, more on the left.. Strength is 4/5 in median innervated and 4+/5 and ulnar innervated intrinsic hand muscles   Sensory:   Sensory testing is intact to touch and vibration in the arms.  She has reduced vibration sensation in the feet.  Coordination: Finger-nose-finger and heel-to-shin is performed better on the right than the left.  Gait and station: Station is normal.  Gait is wide and tandem gait is poor.  Romberg negative. Reflexes: Deep tendon reflexes are symmetric and normal bilaterally.      DIAGNOSTIC DATA (LABS, IMAGING, TESTING) - I reviewed patient records, labs, notes, testing and imaging myself where available.  Lab Results  Component Value Date   WBC 6.0 09/21/2019   HGB 13.3 09/21/2019   HCT 39.8 09/21/2019   MCV 90 09/21/2019   PLT 204 09/21/2019      Component Value Date/Time   NA 140 09/21/2019 1037   K 4.4 09/21/2019 1037   CL 104 09/21/2019 1037   CO2 23 09/21/2019 1037   GLUCOSE 84 09/21/2019 1037   GLUCOSE 88 03/09/2018 1342   BUN 15 09/21/2019 1037   CREATININE 0.78 09/21/2019 1037   CREATININE 0.75 03/09/2018 1342   CALCIUM 9.4 09/21/2019 1037   PROT 6.2 09/21/2019 1037   ALBUMIN 4.3 09/21/2019 1037   AST 24 09/21/2019 1037   AST 20 03/09/2018 1342   ALT 24 09/21/2019 1037   ALT 22 03/09/2018 1342   ALKPHOS 147 (H) 09/21/2019 1037   BILITOT 0.3 09/21/2019 1037   BILITOT 0.4 03/09/2018 1342   GFRNONAA 77 09/21/2019 1037   GFRNONAA >60 03/09/2018 1342    GFRAA 88 09/21/2019 1037   GFRAA >60 03/09/2018 1342   Lab Results  Component Value Date   CHOL 242 (H) 10/17/2019   HDL 47 (L) 10/17/2019   LDLCALC 161 (H) 10/17/2019   LDLDIRECT 130.2 11/03/2011   TRIG 187 (H) 10/17/2019  CHOLHDL 5.1 (H) 10/17/2019   No results found for: HGBA1C No results found for: VITAMINB12 Lab Results  Component Value Date   TSH 4.260 09/21/2019       ASSESSMENT AND PLAN  No diagnosis found.   1.    She will continue leflunomide for MS.   Check labs today   2.    Continue exercises and physical therapy.   3.    She is seeing pain management for LBP issues 4.    Tramadol at night to help with the pain.    Also renew clonazepam and modafinil.  5.    Return to see me in about 6 months but is advised to call sooner if she has new or worsening neurologic symptoms.   Ashlea Dusing A. Felecia Shelling, MD, PhD 7/49/4496, 75:91 AM Certified in Neurology, Clinical Neurophysiology, Sleep Medicine, Pain Medicine and Neuroimaging  [facility] [address] [place], [place] [address] [phone]-' \

## 2020-04-05 LAB — CBC WITH DIFFERENTIAL/PLATELET
Basophils Absolute: 0.1 10*3/uL (ref 0.0–0.2)
Basos: 1 %
EOS (ABSOLUTE): 0.2 10*3/uL (ref 0.0–0.4)
Eos: 3 %
Hematocrit: 40.9 % (ref 34.0–46.6)
Hemoglobin: 13.2 g/dL (ref 11.1–15.9)
Immature Grans (Abs): 0 10*3/uL (ref 0.0–0.1)
Immature Granulocytes: 1 %
Lymphocytes Absolute: 0.8 10*3/uL (ref 0.7–3.1)
Lymphs: 16 %
MCH: 29.7 pg (ref 26.6–33.0)
MCHC: 32.3 g/dL (ref 31.5–35.7)
MCV: 92 fL (ref 79–97)
Monocytes Absolute: 0.6 10*3/uL (ref 0.1–0.9)
Monocytes: 12 %
Neutrophils Absolute: 3.3 10*3/uL (ref 1.4–7.0)
Neutrophils: 67 %
Platelets: 211 10*3/uL (ref 150–450)
RBC: 4.44 x10E6/uL (ref 3.77–5.28)
RDW: 13.6 % (ref 11.7–15.4)
WBC: 5 10*3/uL (ref 3.4–10.8)

## 2020-04-05 LAB — COMPREHENSIVE METABOLIC PANEL
ALT: 65 IU/L — ABNORMAL HIGH (ref 0–32)
AST: 38 IU/L (ref 0–40)
Albumin/Globulin Ratio: 2 (ref 1.2–2.2)
Albumin: 4.5 g/dL (ref 3.7–4.7)
Alkaline Phosphatase: 192 IU/L — ABNORMAL HIGH (ref 44–121)
BUN/Creatinine Ratio: 31 — ABNORMAL HIGH (ref 12–28)
BUN: 24 mg/dL (ref 8–27)
Bilirubin Total: 0.2 mg/dL (ref 0.0–1.2)
CO2: 22 mmol/L (ref 20–29)
Calcium: 9.5 mg/dL (ref 8.7–10.3)
Chloride: 103 mmol/L (ref 96–106)
Creatinine, Ser: 0.77 mg/dL (ref 0.57–1.00)
GFR calc Af Amer: 89 mL/min/{1.73_m2} (ref >59)
GFR calc non Af Amer: 77 mL/min/{1.73_m2} (ref >59)
Globulin, Total: 2.2 g/dL (ref 1.5–4.5)
Glucose: 83 mg/dL (ref 65–99)
Potassium: 4.5 mmol/L (ref 3.5–5.2)
Sodium: 139 mmol/L (ref 134–144)
Total Protein: 6.7 g/dL (ref 6.0–8.5)

## 2020-04-05 LAB — VITAMIN D 25 HYDROXY (VIT D DEFICIENCY, FRACTURES): Vit D, 25-Hydroxy: 41.8 ng/mL (ref 30.0–100.0)

## 2020-04-11 ENCOUNTER — Other Ambulatory Visit: Payer: Self-pay

## 2020-04-11 ENCOUNTER — Encounter: Payer: Self-pay | Admitting: Physical Therapy

## 2020-04-11 ENCOUNTER — Ambulatory Visit: Payer: Medicare Other | Admitting: Physical Therapy

## 2020-04-11 DIAGNOSIS — M545 Low back pain: Secondary | ICD-10-CM | POA: Diagnosis not present

## 2020-04-11 DIAGNOSIS — R252 Cramp and spasm: Secondary | ICD-10-CM

## 2020-04-11 DIAGNOSIS — M6281 Muscle weakness (generalized): Secondary | ICD-10-CM

## 2020-04-11 DIAGNOSIS — M542 Cervicalgia: Secondary | ICD-10-CM

## 2020-04-11 DIAGNOSIS — R262 Difficulty in walking, not elsewhere classified: Secondary | ICD-10-CM | POA: Diagnosis not present

## 2020-04-11 DIAGNOSIS — G8929 Other chronic pain: Secondary | ICD-10-CM

## 2020-04-11 NOTE — Therapy (Signed)
[place], [place], [address] Phone: [phone]   Fax:  [phone]  Physical Therapy Treatment  Patient Details  Name: Dawn [name] MRN: [id number] Date of Birth: 23-Mar-1946 Referring Provider (PT): Dr Christell Constant   Encounter Date: 04/11/2020   PT End of Session - 04/11/20 1702    Visit Number 40    Number of Visits 45    Date for PT Re-Evaluation 05/16/20    Authorization Type progress not eperfromed on visit 37 next on 47    PT Start Time 1015    PT Stop Time 1058    PT Time Calculation (min) 43 min    Activity Tolerance Patient tolerated treatment well    Behavior During Therapy [facility] for tasks assessed/performed           Past Medical History:  Diagnosis Date  . Allergy   . Essential hypertension 08/25/2018  . Hot flashes, menopausal 10/13/2011   Estradiol started   . Memory loss 09/21/2019  . Multiple sclerosis ([place])   . Neuropathy   . Osteoarthritis   . Vision abnormalities     Past Surgical History:  Procedure Laterality Date  . ABDOMINAL HYSTERECTOMY  2002   partial  . LUMBAR FUSION      There were no vitals filed for this visit.   Subjective Assessment - 04/11/20 1019    Subjective Patient reports she is having some visual issues related to her MS. She reports it will clear in a few minutes.    How long can you stand comfortably? hurts as soon as she stands. Has to shift frequently    How long can you walk comfortably? limited community ambualtion    Diagnostic tests Nothing recent    Currently in Pain? No/denies                             [facility] Adult PT Treatment/Exercise - 04/11/20 0001      High Level Balance   High Level Balance Comments narrow base eyes open 2x30, eyes closed 3x10 sec each, tandem stance each leg 2x30 sec eyes open, 2x10 sec eyes closed each leg; supervision for eyes closed as opposed to min guard last visit       Neuro Re-ed     Neuro Re-ed Details  narrow base on sair-ex 3x45 dsec hold eyes closed mod a on air-ex 3x1 min;       Neck Exercises: Seated   Other Seated Exercise laq 2x15; hamstring curl red 2x10; clamshells 2x20 red       Lumbar Exercises: Stretches   Active Hamstring Stretch Limitations seated 3x20 sec hold each leg     Lower Trunk Rotation Limitations x20    Piriformis Stretch 2 reps;20 seconds      Lumbar Exercises: Standing   Heel Raises Limitations x20 on air-ex with insturction to catch herself going backwards     Functional Squats 15 reps    Other Standing Lumbar Exercises step onto air-ex x10 each leg side to ide and forward back     Other Standing Lumbar Exercises keelt ebell lift from 14 inch step 10 lbs       Lumbar Exercises: Supine   Clam Limitations supine red band 2x10       Manual Therapy   Soft tissue mobilization roller to bilateral gluteals and IT bands     Manual Traction LAD Grade II-III  PT Education - 04/11/20 1701    Education Details HEP and symptom mangement    Person(s) Educated Patient    Methods Explanation;Demonstration;Tactile cues;Verbal cues    Comprehension Verbalized understanding;Verbal cues required;Tactile cues required;Returned demonstration            PT Short Term Goals - 03/28/20 1446      PT SHORT TERM GOAL #1   Title Patient will report no pain radiating into anterior thigh    Baseline no    Time 4    Period Weeks    Status Achieved    Target Date 12/09/19      PT SHORT TERM GOAL #2   Title Patient will increase bilateral LE strength to 4/5    Baseline 4+/5 gross    Time 4    Period Weeks    Status Achieved      PT SHORT TERM GOAL #3   Title Patient will increase passive right hip flexion to 105 degrrees    Baseline 100 today with pain onthe right side    Time 4    Period Weeks    Status On-going      PT SHORT TERM GOAL #4   Title Patient will increase right cervical rotation by 10 degrees    Baseline  to 58 degres today    Time 4    Period Weeks    Status On-going    Target Date 01/09/20      PT SHORT TERM GOAL #5   Title Patient will increase ross bilateral UE strength to 4+/5    Baseline improved to 4/5    Time 4    Period Weeks    Status On-going             PT Long Term Goals - 01/30/20 1317      PT LONG TERM GOAL #1   Title Patient will stand for 30 min without self reported increase pain in order to stand at a kitchen sink.    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     Baseline pain at edn range hip flexion and ir but intensity decreased    Time 6    Period Weeks    Status On-going    Target Date 02/27/20      PT LONG TERM GOAL #4   Title Patient will reach behind his head without pain in order to perfrom ADL's    Baseline mild pain and tightness but improved    Time 6    Period Weeks    Status On-going    Target Date 01/23/20      PT LONG TERM GOAL #5   Title Patient will report no radicular pain into her hands    Baseline more intermittent    Time 6    Period Weeks    Status On-going  Target Date 01/23/20      PT LONG TERM GOAL #6   Title POatient will improve ABC score to 50% function in order to decrease her risk of fall    Baseline 40%    Period Weeks    Status On-going    Target Date 01/23/20                 Plan - 04/11/20 1705    Clinical Impression Statement Therapy continues to progress patients balance activty. She required less gaurding wiht balance activity. Therapy will continue to  progress ther-ex as tolerated.    Personal Factors and Comorbidities Comorbidity 1;Comorbidity 2    Comorbidities MS, OA    Examination-Activity Limitations Locomotion Level;Carry;Stand;Lift;Stairs;Squat    Stability/Clinical Decision Making Stable/Uncomplicated    Clinical Decision Making Low    Rehab Potential Poor    PT Frequency 1x / week    PT Duration 8 weeks    PT Treatment/Interventions ADLs/Self Care Home Management;Electrical Stimulation;Cryotherapy;Iontophoresis 4mg /ml Dexamethasone;Moist Heat;Traction;DME Instruction;Neuromuscular re-education;Patient/family education;Manual techniques;Passive range of motion;Taping    PT Next Visit Plan work on balance with patient,    PT Home Exercise Plan LTR; hamstring stretch, tennis ball trigger point release ; abdominal breathing;Access Code: [email or web address].Seated Upper Trapezius Stretch - 1 x daily - 7 x weekly - 1 sets - 3 reps - 20 hold.Gentle Levator Scapulae Stretch - 1 x daily - 7 x weekly - 1 sets - 3 reps - 20 hold.Shoulder extension with resistance - Neutral - 1 x daily - 7 x weekly - 3 sets - 10 reps.Scapular Retraction with Resistance - 1 x daily - 7 x weekly - 3 sets - 10 reps    Consulted and Agree with Plan of Care Patient           Patient will benefit from skilled therapeutic intervention in order to improve the following deficits and impairments:  Abnormal gait, Difficulty walking, Decreased range of motion, Decreased mobility, Decreased strength, Postural dysfunction, Pain, Increased muscle spasms  Visit Diagnosis: Chronic bilateral low back pain without sciatica  Muscle weakness (generalized)  Cramp and spasm  Difficulty in walking, not elsewhere classified  Cervicalgia     Problem List Patient Active Problem List   Diagnosis Date Noted  . Sacroiliitis, not elsewhere classified ([place]) 12/22/2019  . Other secondary scoliosis, lumbar region 12/22/2019  . Arthritis of facet joint of lumbar spine 12/22/2019   . Cervical radiculopathy 10/18/2019  . Numbness 09/21/2019  . Bilateral carpal tunnel syndrome 09/21/2019  . High risk medication use 09/21/2019  . Memory loss 09/21/2019  . Essential hypertension 08/25/2018  . Abnormal SPEP 02/19/2018  . Hand pain 02/20/2017  . Multiple joint pain 02/18/2017  . Right elbow pain 12/09/2016  . Disturbed cognition 06/25/2016  . Trochanteric bursitis of left hip 02/18/2016  . Sciatica, right side 10/30/2015  . Bilateral arm pain 10/10/2015  . Trochanteric bursitis of right hip 08/04/2014  . Adjustment disorder with mixed anxiety and depressed mood 08/04/2014  . Chronic fatigue 08/04/2014  . Urinary frequency 08/04/2014  . Abnormality of gait 08/04/2014  . Unspecified visual disturbance 01/31/2013  . Transient vision disturbance 01/31/2013  . Routine general medical examination at a health care facility 11/04/2011  . Colon polyps 11/03/2011  . Hot flashes, menopausal 10/13/2011  . POSTHERPETIC NEURALGIA 10/03/2009  . DISPLCMT LUMBAR INTERVERT [place] W/O MYELOPATHY 09/05/2009  . RENAL CALCULUS, RECURRENT 09/04/2009  . Hyperlipidemia 08/31/2009  . MULTIPLE SCLEROSIS, PROGRESSIVE/RELAPSING 08/31/2009  . ALLERGIC RHINITIS 08/31/2009  Carney Living PT DPT  04/11/2020, 5:11 PM  [facility] [address] [place], [place], [address] Phone: [phone]   Fax:  [phone]  Name: Dawn [name] MRN: [id number] Date of Birth: 1946-03-06

## 2020-04-13 ENCOUNTER — Other Ambulatory Visit: Payer: Self-pay | Admitting: Neurology

## 2020-04-16 ENCOUNTER — Other Ambulatory Visit: Payer: Self-pay

## 2020-04-16 ENCOUNTER — Encounter: Payer: Self-pay | Admitting: Physical Therapy

## 2020-04-16 ENCOUNTER — Ambulatory Visit: Payer: Medicare Other | Admitting: Neurology

## 2020-04-16 ENCOUNTER — Ambulatory Visit: Payer: Medicare Other | Admitting: Physical Therapy

## 2020-04-16 DIAGNOSIS — M545 Low back pain, unspecified: Secondary | ICD-10-CM

## 2020-04-16 DIAGNOSIS — M6281 Muscle weakness (generalized): Secondary | ICD-10-CM

## 2020-04-16 DIAGNOSIS — G8929 Other chronic pain: Secondary | ICD-10-CM

## 2020-04-16 DIAGNOSIS — R252 Cramp and spasm: Secondary | ICD-10-CM

## 2020-04-16 DIAGNOSIS — M542 Cervicalgia: Secondary | ICD-10-CM

## 2020-04-16 DIAGNOSIS — R262 Difficulty in walking, not elsewhere classified: Secondary | ICD-10-CM

## 2020-04-16 NOTE — Therapy (Signed)
[place], [place], [address] Phone: [phone]   Fax:  [phone]  Physical Therapy Treatment  Patient Details  Name: Dawn [name] MRN: [id number] Date of Birth: Jan 11, 1946 Referring Provider (PT): Dr Christell Constant   Encounter Date: 04/16/2020   PT End of Session - 04/16/20 1300    Visit Number 41    Number of Visits 45    Date for PT Re-Evaluation 05/16/20    Authorization Type progress not eperfromed on visit 37 next on 47    PT Start Time 1017    PT Stop Time 1058    PT Time Calculation (min) 41 min    Equipment Utilized During Treatment Gait belt    Activity Tolerance Patient tolerated treatment well    Behavior During Therapy [facility] for tasks assessed/performed           Past Medical History:  Diagnosis Date  . Allergy   . Essential hypertension 08/25/2018  . Hot flashes, menopausal 10/13/2011   Estradiol started   . Memory loss 09/21/2019  . Multiple sclerosis ([place])   . Neuropathy   . Osteoarthritis   . Vision abnormalities     Past Surgical History:  Procedure Laterality Date  . ABDOMINAL HYSTERECTOMY  2002   partial  . LUMBAR FUSION      There were no vitals filed for this visit.   Subjective Assessment - 04/16/20 1257    Pertinent History MS    How long can you stand comfortably? hurts as soon as she stands. Has to shift frequently    How long can you walk comfortably? limited community ambualtion    Diagnostic tests Nothing recent    Currently in Pain? Yes    Pain Score 6     Pain Location Hip    Pain Orientation Right;Left    Pain Descriptors / Indicators Aching    Pain Type Chronic pain    Pain Radiating Towards into bilateral buttocks    Pain Onset More than a month ago    Pain Frequency Constant    Aggravating Factors  standing and walking    Pain Relieving Factors rest and stretching    Effect of Pain on Daily Activities difficulty perfroming ADL;s    Multiple  Pain Sites No                             [facility] Adult PT Treatment/Exercise - 04/16/20 0001      Neck Exercises: Seated   Other Seated Exercise laq 2x15; hamstring curl yellow 2x10; clamshells 2x20 yellow;        Lumbar Exercises: Stretches   Active Hamstring Stretch Limitations seated 3x20 sec hold each leg     Lower Trunk Rotation Limitations x20    Piriformis Stretch 2 reps;20 seconds      Manual Therapy   Joint Mobilization PA glides form L3-L5     Soft tissue mobilization roller to bilateral gluteals and IT bands     Manual Traction LAD Grade II-III            Trigger Point Dry Needling - 04/16/20 0001    Consent Given? Yes    Education Handout Provided Previously provided    Muscles Treated Back/Hip Piriformis    Other Dry Needling 2 spots in both glutes with good twitch and improved pain    Gluteus Medius Response Twitch response elicited  PT Education - 04/16/20 1258    Education Details symptom management during pain flairs    Person(s) Educated Patient    Methods Explanation;Demonstration;Verbal cues;Tactile cues    Comprehension Verbalized understanding;Returned demonstration;Verbal cues required;Tactile cues required            PT Short Term Goals - 03/28/20 1446      PT SHORT TERM GOAL #1   Title Patient will report no pain radiating into anterior thigh    Baseline no    Time 4    Period Weeks    Status Achieved    Target Date 12/09/19      PT SHORT TERM GOAL #2   Title Patient will increase bilateral LE strength to 4/5    Baseline 4+/5 gross    Time 4    Period Weeks    Status Achieved      PT SHORT TERM GOAL #3   Title Patient will increase passive right hip flexion to 105 degrrees    Baseline 100 today with pain onthe right side    Time 4    Period Weeks    Status On-going      PT SHORT TERM GOAL #4   Title Patient will increase right cervical rotation by 10 degrees    Baseline to 58 degres today     Time 4    Period Weeks    Status On-going    Target Date 01/09/20      PT SHORT TERM GOAL #5   Title Patient will increase ross bilateral UE strength to 4+/5    Baseline improved to 4/5    Time 4    Period Weeks    Status On-going             PT Long Term Goals - 01/30/20 1317      PT LONG TERM GOAL #1   Title Patient will stand for 30 min without self reported increase pain in order to stand at a kitchen sink.    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     Baseline pain at edn range hip flexion and ir but intensity decreased    Time 6    Period Weeks    Status On-going    Target Date 02/27/20      PT LONG TERM GOAL #4   Title Patient will reach behind his head without pain in order to perfrom ADL's    Baseline mild pain and tightness but improved    Time 6    Period Weeks    Status On-going    Target Date 01/23/20      PT LONG TERM GOAL #5   Title Patient will report no radicular pain into her hands    Baseline more intermittent    Time 6    Period Weeks    Status On-going  Target Date 01/23/20      PT LONG TERM GOAL #6   Title POatient will improve ABC score to 50% function in order to decrease her risk of fall    Baseline 40%    Period Weeks    Status On-going    Target Date 01/23/20                 Plan - 04/16/20 1300    Clinical Impression Statement Patient had a significant improvement in pain with needling and manual therapy. She tolerated light ther-ex well. therapy scaled back her bands to  yellow today. Therapy advised her to keep moving when she is urting and advancewhen she is not    Personal Factors and Comorbidities Comorbidity 1;Comorbidity 2    Comorbidities MS, OA    Examination-Activity Limitations Locomotion Level;Carry;Stand;Lift;Stairs;Squat    Examination-Participation Restrictions Meal Prep;Cleaning;Community Activity;Laundry;Shop    Stability/Clinical Decision Making Stable/Uncomplicated    Clinical Decision Making Low    Rehab Potential Poor    PT Frequency 1x / week    PT Duration 8 weeks    PT Treatment/Interventions ADLs/Self Care Home Management;Electrical Stimulation;Cryotherapy;Iontophoresis 4mg /ml Dexamethasone;Moist Heat;Traction;DME Instruction;Neuromuscular re-education;Patient/family education;Manual techniques;Passive range of motion;Taping    PT Next Visit Plan work on balance with patient,    PT Home Exercise Plan LTR; hamstring stretch, tennis ball trigger point release ; abdominal breathing;Access Code: [email or web address].Seated Upper Trapezius Stretch - 1 x daily - 7 x weekly - 1 sets - 3 reps - 20 hold.Gentle Levator Scapulae Stretch - 1 x daily - 7 x weekly - 1 sets - 3 reps - 20 hold.Shoulder extension with resistance - Neutral - 1 x daily - 7 x weekly - 3 sets - 10 reps.Scapular Retraction with Resistance - 1 x daily - 7 x weekly - 3 sets - 10 reps    Consulted and Agree with Plan of Care Patient           Patient will benefit from skilled therapeutic intervention in order to improve the following deficits and impairments:  Abnormal gait, Difficulty walking, Decreased range of motion, Decreased mobility, Decreased strength, Postural dysfunction, Pain, Increased muscle spasms  Visit Diagnosis: Chronic bilateral low back pain without sciatica  Muscle weakness (generalized)  Cramp and spasm  Difficulty in walking, not elsewhere classified  Cervicalgia     Problem List Patient Active Problem List   Diagnosis Date Noted  .  Sacroiliitis, not elsewhere classified ([place]) 12/22/2019  . Other secondary scoliosis, lumbar region 12/22/2019  . Arthritis of facet joint of lumbar spine 12/22/2019  . Cervical radiculopathy 10/18/2019  . Numbness 09/21/2019  . Bilateral carpal tunnel syndrome 09/21/2019  . High risk medication use 09/21/2019  . Memory loss 09/21/2019  . Essential hypertension 08/25/2018  . Abnormal SPEP 02/19/2018  . Hand pain 02/20/2017  . Multiple joint pain 02/18/2017  . Right elbow pain 12/09/2016  . Disturbed cognition 06/25/2016  . Trochanteric bursitis of left hip 02/18/2016  . Sciatica, right side 10/30/2015  . Bilateral arm pain 10/10/2015  . Trochanteric bursitis of right hip 08/04/2014  . Adjustment disorder with mixed anxiety and depressed mood 08/04/2014  . Chronic fatigue 08/04/2014  . Urinary frequency 08/04/2014  . Abnormality of gait 08/04/2014  . Unspecified visual disturbance 01/31/2013  . Transient vision disturbance 01/31/2013  . Routine general medical examination at a health care facility 11/04/2011  . Colon polyps 11/03/2011  . Hot flashes, menopausal 10/13/2011  . POSTHERPETIC NEURALGIA 10/03/2009  . DISPLCMT LUMBAR INTERVERT [place]  W/O MYELOPATHY 09/05/2009  . RENAL CALCULUS, RECURRENT 09/04/2009  . Hyperlipidemia 08/31/2009  . MULTIPLE SCLEROSIS, PROGRESSIVE/RELAPSING 08/31/2009  . ALLERGIC RHINITIS 08/31/2009    Carney Living PT DPT  04/16/2020, 1:22 PM  [facility] [address] [place], [place], [address] Phone: [phone]   Fax:  [phone]  Name: Dawn [name] MRN: [id number] Date of Birth: 02-22-1946

## 2020-04-17 ENCOUNTER — Telehealth: Payer: Self-pay | Admitting: Emergency Medicine

## 2020-04-17 NOTE — Telephone Encounter (Signed)
Modafinil PA reauthorization sent to [facility], [place] [id number].  Awaiting determination.

## 2020-04-19 DIAGNOSIS — Z23 Encounter for immunization: Secondary | ICD-10-CM | POA: Diagnosis not present

## 2020-04-19 NOTE — Telephone Encounter (Signed)
Approval received for authorization.  Approved on September 28 Request Reference Number: [id number]. MODAFINIL TAB 200MG  is approved through 10/15/2020.

## 2020-04-23 ENCOUNTER — Other Ambulatory Visit: Payer: Self-pay

## 2020-04-23 ENCOUNTER — Encounter: Payer: Self-pay | Admitting: Physical Therapy

## 2020-04-23 ENCOUNTER — Ambulatory Visit: Payer: No Typology Code available for payment source | Admitting: Internal Medicine

## 2020-04-23 ENCOUNTER — Ambulatory Visit: Payer: Medicare Other | Attending: Neurology | Admitting: Physical Therapy

## 2020-04-23 DIAGNOSIS — M6281 Muscle weakness (generalized): Secondary | ICD-10-CM

## 2020-04-23 DIAGNOSIS — M545 Low back pain, unspecified: Secondary | ICD-10-CM | POA: Diagnosis not present

## 2020-04-23 DIAGNOSIS — R262 Difficulty in walking, not elsewhere classified: Secondary | ICD-10-CM | POA: Insufficient documentation

## 2020-04-23 DIAGNOSIS — R252 Cramp and spasm: Secondary | ICD-10-CM

## 2020-04-23 DIAGNOSIS — M542 Cervicalgia: Secondary | ICD-10-CM | POA: Diagnosis not present

## 2020-04-23 DIAGNOSIS — G8929 Other chronic pain: Secondary | ICD-10-CM | POA: Diagnosis not present

## 2020-04-23 NOTE — Therapy (Signed)
[place], [place], [address] Phone: [phone]   Fax:  [phone]  Physical Therapy Treatment  Patient Details  Name: Dawn [name] MRN: [id number] Date of Birth: [birth year] Referring Provider (PT): Dr Christell Constant   Encounter Date: 04/23/2020   PT End of Session - 04/23/20 1449    Visit Number 42    Number of Visits 45    Date for PT Re-Evaluation 05/16/20    Authorization Type progress not eperfromed on visit 37 next on 47    PT Start Time 1145    PT Stop Time 1227    PT Time Calculation (min) 42 min    Activity Tolerance Patient tolerated treatment well    Behavior During Therapy [facility] for tasks assessed/performed           Past Medical History:  Diagnosis Date  . Allergy   . Essential hypertension 08/25/2018  . Hot flashes, menopausal 10/13/2011   Estradiol started   . Memory loss 09/21/2019  . Multiple sclerosis ([place])   . Neuropathy   . Osteoarthritis   . Vision abnormalities     Past Surgical History:  Procedure Laterality Date  . ABDOMINAL HYSTERECTOMY  2002   partial  . LUMBAR FUSION      There were no vitals filed for this visit.   Subjective Assessment - 04/23/20 1151    Subjective Patient reports she has had tightness across the back.    Pertinent History MS    How long can you stand comfortably? hurts as soon as she stands. Has to shift frequently    How long can you walk comfortably? limited community ambualtion    Diagnostic tests Nothing recent    Currently in Pain? Yes    Pain Score 5     Pain Location Back    Pain Orientation Right;Left    Pain Descriptors / Indicators Aching    Pain Type Chronic pain    Pain Onset More than a month ago    Pain Frequency Constant    Aggravating Factors  standing and walking    Pain Relieving Factors rest and stretching    Effect of Pain on Daily Activities difficulty perfroming ADL's                              [facility] Adult PT Treatment/Exercise - 04/23/20 0001      Neck Exercises: Seated   Other Seated Exercise ball roll x10 5 sec hold, lateral side to side x10 5 sec hold  ball press x10     Other Seated Exercise laq 2x15; hamstring curl yellow 2x10; clamshells 2x20 yellow;        Lumbar Exercises: Stretches   Active Hamstring Stretch Limitations seated 3x20 sec hold each leg     Lower Trunk Rotation Limitations x20    Piriformis Stretch 2 reps;20 seconds      Manual Therapy   Joint Mobilization PA glides form L3-L5     Soft tissue mobilization roller to bilateral gluteals and IT bands / IASTYM to paraspainals     Manual Traction LAD Grade II-III                  PT Education - 04/23/20 1448    Education Details reviewed ball exercises    Person(s) Educated Patient    Methods Explanation;Demonstration;Tactile cues;Verbal cues    Comprehension Verbalized understanding;Returned demonstration;Verbal cues required;Tactile cues required  PT Short Term Goals - 03/28/20 1446      PT SHORT TERM GOAL #1   Title Patient will report no pain radiating into anterior thigh    Baseline no    Time 4    Period Weeks    Status Achieved    Target Date 12/09/19      PT SHORT TERM GOAL #2   Title Patient will increase bilateral LE strength to 4/5    Baseline 4+/5 gross    Time 4    Period Weeks    Status Achieved      PT SHORT TERM GOAL #3   Title Patient will increase passive right hip flexion to 105 degrrees    Baseline 100 today with pain onthe right side    Time 4    Period Weeks    Status On-going      PT SHORT TERM GOAL #4   Title Patient will increase right cervical rotation by 10 degrees    Baseline to 58 degres today    Time 4    Period Weeks    Status On-going    Target Date 01/09/20      PT SHORT TERM GOAL #5   Title Patient will increase ross bilateral UE strength to 4+/5    Baseline improved to 4/5    Time 4     Period Weeks    Status On-going             PT Long Term Goals - 01/30/20 1317      PT LONG TERM GOAL #1   Title Patient will stand for 30 min without self reported increase pain in order to stand at a kitchen sink.    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     Baseline pain at edn range hip flexion and ir but intensity decreased    Time 6    Period Weeks    Status On-going    Target Date 02/27/20      PT LONG TERM GOAL #4   Title Patient will reach behind his head without pain in order to perfrom ADL's    Baseline mild pain and tightness but improved    Time 6    Period Weeks    Status On-going    Target Date 01/23/20      PT LONG TERM GOAL #5   Title Patient will report no radicular pain into her hands    Baseline more intermittent    Time 6    Period Weeks    Status On-going    Target Date 01/23/20      PT LONG TERM GOAL #6   Title POatient will improve ABC score to 50% function in order to decrease her risk of fall    Baseline 40%    Period Weeks    Status On-going  Target Date 01/23/20                 Plan - 04/23/20 1451    Clinical Impression Statement Patient had significant tightness in her lumbar spine today. She reported improved tightness after soft tissue mobilization and manual decompression. therapy reviewed physio ball roll out and press for strengthening and stretching/. She will buy a phsio ball.    Personal Factors and Comorbidities Comorbidity 1;Comorbidity 2    Comorbidities MS, OA     Examination-Activity Limitations Locomotion Level;Carry;Stand;Lift;Stairs;Squat    Examination-Participation Restrictions Meal Prep;Cleaning;Community Activity;Laundry;Shop    Stability/Clinical Decision Making Stable/Uncomplicated    Clinical Decision Making Low    Rehab Potential Poor    PT Frequency 1x / week    PT Duration 8 weeks    PT Treatment/Interventions ADLs/Self Care Home Management;Electrical Stimulation;Cryotherapy;Iontophoresis 4mg /ml Dexamethasone;Moist Heat;Traction;DME Instruction;Neuromuscular re-education;Patient/family education;Manual techniques;Passive range of motion;Taping    PT Home Exercise Plan LTR; hamstring stretch, tennis ball trigger point release ; abdominal breathing;Access Code: [email or web address].Seated Upper Trapezius Stretch - 1 x daily - 7 x weekly - 1 sets - 3 reps - 20 hold.Gentle Levator Scapulae Stretch - 1 x daily - 7 x weekly - 1 sets - 3 reps - 20 hold.Shoulder extension with resistance - Neutral - 1 x daily - 7 x weekly - 3 sets - 10 reps.Scapular Retraction with Resistance - 1 x daily - 7 x weekly - 3 sets - 10 reps    Consulted and Agree with Plan of Care Patient           Patient will benefit from skilled therapeutic intervention in order to improve the following deficits and impairments:  Abnormal gait, Difficulty walking, Decreased range of motion, Decreased mobility, Decreased strength, Postural dysfunction, Pain, Increased muscle spasms  Visit Diagnosis: Chronic bilateral low back pain without sciatica  Muscle weakness (generalized)  Cramp and spasm  Difficulty in walking, not elsewhere classified  Cervicalgia     Problem List Patient Active Problem List   Diagnosis Date Noted  . Sacroiliitis, not elsewhere classified ([place]) 12/22/2019  . Other secondary scoliosis, lumbar region 12/22/2019  . Arthritis of facet joint of lumbar spine 12/22/2019  . Cervical radiculopathy 10/18/2019  . Numbness 09/21/2019  . Bilateral carpal  tunnel syndrome 09/21/2019  . High risk medication use 09/21/2019  . Memory loss 09/21/2019  . Essential hypertension 08/25/2018  . Abnormal SPEP 02/19/2018  . Hand pain 02/20/2017  . Multiple joint pain 02/18/2017  . Right elbow pain 12/09/2016  . Disturbed cognition 06/25/2016  . Trochanteric bursitis of left hip 02/18/2016  . Sciatica, right side 10/30/2015  . Bilateral arm pain 10/10/2015  . Trochanteric bursitis of right hip 08/04/2014  . Adjustment disorder with mixed anxiety and depressed mood 08/04/2014  . Chronic fatigue 08/04/2014  . Urinary frequency 08/04/2014  . Abnormality of gait 08/04/2014  . Unspecified visual disturbance 01/31/2013  . Transient vision disturbance 01/31/2013  . Routine general medical examination at a health care facility 11/04/2011  . Colon polyps 11/03/2011  . Hot flashes, menopausal 10/13/2011  . POSTHERPETIC NEURALGIA 10/03/2009  . DISPLCMT LUMBAR INTERVERT [place] W/O MYELOPATHY 09/05/2009  . RENAL CALCULUS, RECURRENT 09/04/2009  . Hyperlipidemia 08/31/2009  . MULTIPLE SCLEROSIS, PROGRESSIVE/RELAPSING 08/31/2009  . ALLERGIC RHINITIS 08/31/2009    Carney Living PT DPT  04/23/2020, 3:13 PM  [facility] [address] [place], [place], [address] Phone: [phone]   Fax:  [phone]  Name: Dawn [name] MRN: [id number]  Date of Birth: [birth year]

## 2020-04-23 NOTE — Progress Notes (Signed)
Office Visit Note  Patient: Dawn [name]             Date of Birth: 04-07-1946           MRN: [id number]             PCP: Gayland Curry, DO Referring: Sandrea Hughs, NP Visit Date: 04/27/2020 Occupation: _0 @  Subjective:  New Patient (Initial Visit) (Right thumb locking)   History of Present Illness: Dawn [name] is a 74 y.o. female seen in consultation per request of her PCP.  According to patient in 2001 she developed a ruptured disc and underwent discectomy.  After the surgery she started experiencing numbness in her bilateral lower extremities.  She was referred to a neurologist who did a spinal tap and diagnosed her with MS.  She states her lower back pain persist and over time she has developed worsening of the degenerative disc disease.  According the patient she has a scoliosis of her lumbar spine, degenerative disc disease and spinal stenosis.  She has had epidural injections by Dr. Ernestina Patches.  She has been followed by Dr. Durward Fortes and Dr. Felecia Shelling now.  Dr. Durward Fortes diagnosed her with bilateral trochanteric bursitis.  She also had a right labral tear in her hip joint.  Surgery was not recommended.  She states about 4 years ago Dr. Felecia Shelling also referred her to a rheumatologist for work-up for Sjogren's which was negative.  She states for the last few months she has been experiencing pain in her bilateral hands.  She had MRI of her cervical spine which was consistent with degenerative disc disease of the cervical spine.  She also had nerve conduction velocities which was negative for carpal tunnel syndrome.  She is notices intermittent swelling in her hands.  She states the most painful joint is her right thumb.  She has occasional discomfort in her feet.  She not denies discomfort in any other joint.  She has been on leflunomide for the last 3 years for MS.  Activities of Daily Living:  Patient reports morning stiffness for 24 hours.   Patient Reports nocturnal pain.    Difficulty dressing/grooming: Reports Difficulty climbing stairs: Reports Difficulty getting out of chair: Reports Difficulty using hands for taps, buttons, cutlery, and/or writing: Reports  Review of Systems  Constitutional: Positive for fatigue. Negative for night sweats, weight gain and weight loss.  HENT: Positive for mouth dryness. Negative for mouth sores, trouble swallowing, trouble swallowing and nose dryness.   Eyes: Positive for dryness. Negative for pain, redness and visual disturbance.  Respiratory: Negative for cough, shortness of breath and difficulty breathing.   Cardiovascular: Negative for chest pain, palpitations, hypertension, irregular heartbeat and swelling in legs/feet.  Gastrointestinal: Positive for constipation. Negative for blood in stool and diarrhea.  Endocrine: Positive for cold intolerance and heat intolerance. Negative for increased urination.  Genitourinary: Negative for difficulty urinating and vaginal dryness.  Musculoskeletal: Positive for arthralgias, gait problem, joint pain, joint swelling, muscle weakness, morning stiffness and muscle tenderness. Negative for myalgias and myalgias.  Skin: Negative for color change, rash, hair loss, skin tightness, ulcers and sensitivity to sunlight.  Allergic/Immunologic: Positive for susceptible to infections.  Neurological: Positive for numbness and weakness. Negative for dizziness, memory loss and night sweats.  Hematological: Negative for bruising/bleeding tendency and swollen glands.  Psychiatric/Behavioral: Positive for sleep disturbance. Negative for depressed mood. The patient is not nervous/anxious.     PMFS History:  Patient Active Problem List   Diagnosis Date  Noted  . Sacroiliitis, not elsewhere classified ([place]) 12/22/2019  . Other secondary scoliosis, lumbar region 12/22/2019  . Arthritis of facet joint of lumbar spine 12/22/2019  . Cervical radiculopathy 10/18/2019  . Numbness 09/21/2019  .  Bilateral carpal tunnel syndrome 09/21/2019  . High risk medication use 09/21/2019  . Memory loss 09/21/2019  . Essential hypertension 08/25/2018  . Abnormal SPEP 02/19/2018  . Hand pain 02/20/2017  . Multiple joint pain 02/18/2017  . Right elbow pain 12/09/2016  . Disturbed cognition 06/25/2016  . Trochanteric bursitis of left hip 02/18/2016  . Sciatica, right side 10/30/2015  . Bilateral arm pain 10/10/2015  . Trochanteric bursitis of right hip 08/04/2014  . Adjustment disorder with mixed anxiety and depressed mood 08/04/2014  . Chronic fatigue 08/04/2014  . Urinary frequency 08/04/2014  . Abnormality of gait 08/04/2014  . Unspecified visual disturbance 01/31/2013  . Transient vision disturbance 01/31/2013  . Routine general medical examination at a health care facility 11/04/2011  . Colon polyps 11/03/2011  . Hot flashes, menopausal 10/13/2011  . POSTHERPETIC NEURALGIA 10/03/2009  . DISPLCMT LUMBAR INTERVERT [place] W/O MYELOPATHY 09/05/2009  . RENAL CALCULUS, RECURRENT 09/04/2009  . Hyperlipidemia 08/31/2009  . MULTIPLE SCLEROSIS, PROGRESSIVE/RELAPSING 08/31/2009  . ALLERGIC RHINITIS 08/31/2009    Past Medical History:  Diagnosis Date  . Allergy   . Essential hypertension 08/25/2018  . Hot flashes, menopausal 10/13/2011   Estradiol started   . Memory loss 09/21/2019  . Multiple sclerosis ([place])   . Neuropathy   . Osteoarthritis   . Vision abnormalities     Family History  Problem Relation Age of Onset  . Heart failure Mother   . Heart failure Father   . Arthritis Other   . Hypertension Other    Past Surgical History:  Procedure Laterality Date  . ABDOMINAL HYSTERECTOMY  2002   partial  . 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     Tobacco use, amount per day now: former   Past tobacco use, amount per day: 1/2-1 packet   How many years did you use tobacco: 12 years   Alcohol use (drinks per week): 0   Diet: mediterranean based   Do you drink/eat things with caffeine: yes   Marital status:        widow                          What year were you married? 1987   Do you live in a house, apartment, assisted living, condo, trailer, etc.? house   Is it one or more stories? 1 story   How many persons live in your home? 1   Do you have pets in your home?( please list) no   Current or past profession: Science writer    Do you exercise?          yes                        Type and how often? Tai chi and pt therapy, stretches etc....   Do you have a living will? yes   Do you  have a DNR form?      no                             If not, do you want to discuss one? yes   Do you have signed POA/HPOA forms?      no                  If so, please bring to you appointment   Immunization History  Administered Date(s) Administered  . Fluad Quad(high Dose 65+) 04/01/2019  . Influenza Whole 05/14/2011  . Influenza, High Dose Seasonal PF 04/30/2017, 05/15/2018  . PFIZER SARS-COV-2 Vaccination 08/27/2019, 09/21/2019, 03/07/2020  . Pneumococcal Conjugate-13 08/30/2018  . Pneumococcal Polysaccharide-23 03/21/2009  . Td 12/04/2009  . Zoster 11/29/2009     Objective: Vital Signs: BP (!) 158/91 (BP Location: Right Arm, Patient Position: Sitting, Cuff Size: Normal)   Pulse 77   Resp 18   Ht _0  (1.575 m)   Wt 181 lb 9.6 oz (82.4 kg)   BMI 33.22 kg/m    Physical Exam Vitals and nursing note reviewed.  Constitutional:      Appearance: She is well-developed.  HENT:     Head: Normocephalic and atraumatic.  Eyes:     Conjunctiva/sclera: Conjunctivae normal.  Cardiovascular:     Rate and Rhythm: Normal rate and regular rhythm.     Heart sounds: Normal heart sounds.  Pulmonary:     Effort:  Pulmonary effort is normal.     Breath sounds: Normal breath sounds.  Abdominal:     General: Bowel sounds are normal.     Palpations: Abdomen is soft.  Musculoskeletal:     Cervical back: Normal range of motion.  Lymphadenopathy:     Cervical: No cervical adenopathy.  Skin:    General: Skin is warm and dry.     Capillary Refill: Capillary refill takes less than 2 seconds.  Neurological:     Mental Status: She is alert and oriented to person, place, and time.  Psychiatric:        Behavior: Behavior normal.      Musculoskeletal Exam: She has good range of motion of her cervical spine.  She has limited painful range of motion of her lumbar spine.  Shoulder joints, elbow joints, wrist joints with good range of motion.  She has bilateral CMC thickening PIP and DIP thickening.  No synovitis was noted.  She has limited painful range of motion of her right hip joint.  She tenderness over bilateral trochanteric bursa.  Knee joints and ankle joints in good range of motion with no swelling.  She has prominence of bilateral first MTP joint.  No synovitis was noted.  CDAI Exam: CDAI Score: -- Patient Global: --; Provider Global: -- Swollen: --; Tender: -- Joint Exam 04/27/2020   No joint exam has been documented for this visit   There is currently no information documented on the homunculus. Go to the Rheumatology activity and complete the homunculus joint exam.  Investigation: No additional findings.  Imaging: No results found.  Recent Labs: Lab Results  Component Value Date   WBC 5.0 04/04/2020   HGB 13.2 04/04/2020   PLT 211 04/04/2020   NA 139 04/04/2020   K 4.5 04/04/2020   CL 103 04/04/2020   CO2 22 04/04/2020   GLUCOSE 83 04/04/2020   BUN 24 04/04/2020   CREATININE 0.77 04/04/2020   BILITOT 0.2 04/04/2020  ALKPHOS 192 (H) 04/04/2020   AST 38 04/04/2020   ALT 65 (H) 04/04/2020   PROT 6.7 04/04/2020   ALBUMIN 4.5 04/04/2020   CALCIUM 9.5 04/04/2020   GFRAA 89  04/04/2020   QFTBGOLD Negative 08/20/2015   02/18/17: RF<10, ESR 2, ANA+, dsDNA<1, RNP 0.4, Sm-, Ro 6.1, La<0.2    Speciality Comments: No specialty comments available.  Procedures:  No procedures performed Allergies: Patient has no known allergies.   Assessment / Plan:     Visit Diagnoses: Pain in both hands -she complains of pain and discomfort in her bilateral hands.  She has bilateral CMC, PIP and DIP prominence.  No synovitis was noted.  She gets history of intermittent swelling.  I will obtain labs today.  Plan: XR Hand 2 View Right, XR Hand 2 View Left, x-ray findings are consistent with osteoarthritis.  The x-rays were reviewed with the patient.  A handout on hand exercises was given.  Some of the exercises were demonstrated in the office.  I would also give her a [place] brace.  Detailed counseling guarding osteoarthritis was provided.  Rheumatoid factor, Cyclic citrul peptide antibody, IgG, ANA, RNP Antibody, Anti-Smith antibody, Sjogrens syndrome-A extractable nuclear antibody, Sjogrens syndrome-B extractable nuclear antibody, Anti-DNA antibody, double-stranded, C3 and C4  Pain in both feet -she complains of discomfort in her bilateral feet.  No synovitis was noted.  Clinical findings are consistent with osteoarthritis.  Plan: XR Foot 2 Views Right, XR Foot 2 Views Left.  The x-ray findings are consistent with osteoarthritis.  X-rays were reviewed with the patient.  Sicca complex (HCC)-she gives history of sicca symptoms for many years.  I reviewed labs from 2018 which showed positive ANA and positive Ro antibody.  I believe she has Sjogren's.  Over-the-counter products were discussed.  At this point I do not think she needs additional treatment for Sjogren's.  She is already on leflunomide for MS which should help Sjogren's symptoms as well.  Trochanteric bursitis of both hips-she had tenderness over bilateral trochanteric bursa.  She has been followed by Dr. Durward Fortes.  DDD (degenerative  disc disease), cervical-I reviewed her MRI which was consistent with degenerative disc disease.  DDD (degenerative disc disease), lumbar-according to the patient she has degenerative disc disease with spinal stenosis.  I do not have MRI to review.  She has been followed by orthopedics and neurologist.  She has had epidural injections.  She is also thinking about getting radiofrequency ablation for persistent pain.  Arthritis of facet joint of lumbar spine-she has chronic pain.  Other secondary scoliosis, lumbar region  Essential hypertension-blood pressure is elevated today.  Have advised her to monitor blood pressure closely.  Multiple sclerosis ([place]) - Diagnosed 2001.  Followed by Dr. Felecia Shelling.  She is on leflunomide for multiple sclerosis.  Family history of rheumatoid arthritis - Paternal grandmother, sister  Educated about COVID-64 virus infection-she is fully vaccinated against COVID-19.  Use of mask, social distancing and hand hygiene was discussed.  Orders: Orders Placed This Encounter  Procedures  . XR Hand 2 View Right  . XR Hand 2 View Left  . XR Foot 2 Views Right  . XR Foot 2 Views Left  . Rheumatoid factor  . Cyclic citrul peptide antibody, IgG  . ANA  . RNP Antibody  . Anti-Smith antibody  . Sjogrens syndrome-A extractable nuclear antibody  . Sjogrens syndrome-B extractable nuclear antibody  . Anti-DNA antibody, double-stranded  . C3 and C4   No orders of the defined types were  placed in this encounter.     Follow-Up Instructions: Return for Osteoarthritis mild sicca, positive ANA, positive Ro.   Bo Merino, MD  Note - This record has been created using Editor, commissioning.  Chart creation errors have been sought, but may not always  have been located. Such creation errors do not reflect on  the standard of medical care.

## 2020-04-27 ENCOUNTER — Ambulatory Visit: Payer: Self-pay

## 2020-04-27 ENCOUNTER — Telehealth: Payer: Self-pay | Admitting: Rheumatology

## 2020-04-27 ENCOUNTER — Ambulatory Visit (INDEPENDENT_AMBULATORY_CARE_PROVIDER_SITE_OTHER): Payer: Medicare Other | Admitting: Rheumatology

## 2020-04-27 ENCOUNTER — Encounter: Payer: Self-pay | Admitting: Rheumatology

## 2020-04-27 ENCOUNTER — Other Ambulatory Visit: Payer: Self-pay

## 2020-04-27 VITALS — BP 158/91 | HR 77 | Resp 18 | Ht 62.0 in | Wt 181.6 lb

## 2020-04-27 DIAGNOSIS — G35 Multiple sclerosis: Secondary | ICD-10-CM

## 2020-04-27 DIAGNOSIS — M7061 Trochanteric bursitis, right hip: Secondary | ICD-10-CM | POA: Diagnosis not present

## 2020-04-27 DIAGNOSIS — M47816 Spondylosis without myelopathy or radiculopathy, lumbar region: Secondary | ICD-10-CM

## 2020-04-27 DIAGNOSIS — M5030 Other cervical disc degeneration, unspecified cervical region: Secondary | ICD-10-CM | POA: Diagnosis not present

## 2020-04-27 DIAGNOSIS — M79671 Pain in right foot: Secondary | ICD-10-CM | POA: Diagnosis not present

## 2020-04-27 DIAGNOSIS — I10 Essential (primary) hypertension: Secondary | ICD-10-CM

## 2020-04-27 DIAGNOSIS — M5136 Other intervertebral disc degeneration, lumbar region: Secondary | ICD-10-CM | POA: Diagnosis not present

## 2020-04-27 DIAGNOSIS — Z7189 Other specified counseling: Secondary | ICD-10-CM | POA: Diagnosis not present

## 2020-04-27 DIAGNOSIS — M4156 Other secondary scoliosis, lumbar region: Secondary | ICD-10-CM | POA: Diagnosis not present

## 2020-04-27 DIAGNOSIS — M3500 Sicca syndrome, unspecified: Secondary | ICD-10-CM

## 2020-04-27 DIAGNOSIS — Z8261 Family history of arthritis: Secondary | ICD-10-CM

## 2020-04-27 DIAGNOSIS — M79672 Pain in left foot: Secondary | ICD-10-CM | POA: Diagnosis not present

## 2020-04-27 DIAGNOSIS — M7062 Trochanteric bursitis, left hip: Secondary | ICD-10-CM

## 2020-04-27 DIAGNOSIS — M79642 Pain in left hand: Secondary | ICD-10-CM

## 2020-04-27 DIAGNOSIS — M79641 Pain in right hand: Secondary | ICD-10-CM

## 2020-04-27 NOTE — Telephone Encounter (Signed)
Patient requests that a copy of her office visit note from today, 04/27/2020 be sent to her Neurologist, Dr. Arlice Colt. Also, send a copy to Dr. Jose Persia.

## 2020-04-27 NOTE — Telephone Encounter (Signed)
Office note sent to the requested physicians.

## 2020-04-27 NOTE — Patient Instructions (Signed)
Hand Exercises Hand exercises can be helpful for almost anyone. These exercises can strengthen the hands, improve flexibility and movement, and increase blood flow to the hands. These results can make work and daily tasks easier. Hand exercises can be especially helpful for people who have joint pain from arthritis or have nerve damage from overuse (carpal tunnel syndrome). These exercises can also help people who have injured a hand. Exercises Most of these hand exercises are gentle stretching and motion exercises. It is usually safe to do them often throughout the day. Warming up your hands before exercise may help to reduce stiffness. You can do this with gentle massage or by placing your hands in warm water for 10-15 minutes. It is normal to feel some stretching, pulling, tightness, or mild discomfort as you begin new exercises. This will gradually improve. Stop an exercise right away if you feel sudden, severe pain or your pain gets worse. Ask your health care provider which exercises are best for you. Knuckle bend or "claw" fist 1. Stand or sit with your arm, hand, and all five fingers pointed straight up. Make sure to keep your wrist straight during the exercise. 2. Gently bend your fingers down toward your palm until the tips of your fingers are touching the top of your palm. Keep your big knuckle straight and just bend the small knuckles in your fingers. 3. Hold this position for __________ seconds. 4. Straighten (extend) your fingers back to the starting position. Repeat this exercise 5-10 times with each hand. Full finger fist 1. Stand or sit with your arm, hand, and all five fingers pointed straight up. Make sure to keep your wrist straight during the exercise. 2. Gently bend your fingers into your palm until the tips of your fingers are touching the middle of your palm. 3. Hold this position for __________ seconds. 4. Extend your fingers back to the starting position, stretching every  joint fully. Repeat this exercise 5-10 times with each hand. Straight fist 1. Stand or sit with your arm, hand, and all five fingers pointed straight up. Make sure to keep your wrist straight during the exercise. 2. Gently bend your fingers at the big knuckle, where your fingers meet your hand, and the middle knuckle. Keep the knuckle at the tips of your fingers straight and try to touch the bottom of your palm. 3. Hold this position for __________ seconds. 4. Extend your fingers back to the starting position, stretching every joint fully. Repeat this exercise 5-10 times with each hand. Tabletop 1. Stand or sit with your arm, hand, and all five fingers pointed straight up. Make sure to keep your wrist straight during the exercise. 2. Gently bend your fingers at the big knuckle, where your fingers meet your hand, as far down as you can while keeping the small knuckles in your fingers straight. Think of forming a tabletop with your fingers. 3. Hold this position for __________ seconds. 4. Extend your fingers back to the starting position, stretching every joint fully. Repeat this exercise 5-10 times with each hand. Finger spread 1. Place your hand flat on a table with your palm facing down. Make sure your wrist stays straight as you do this exercise. 2. Spread your fingers and thumb apart from each other as far as you can until you feel a gentle stretch. Hold this position for __________ seconds. 3. Bring your fingers and thumb tight together again. Hold this position for __________ seconds. Repeat this exercise 5-10 times with each hand.   Making circles 1. Stand or sit with your arm, hand, and all five fingers pointed straight up. Make sure to keep your wrist straight during the exercise. 2. Make a circle by touching the tip of your thumb to the tip of your index finger. 3. Hold for __________ seconds. Then open your hand wide. 4. Repeat this motion with your thumb and each finger on your  hand. Repeat this exercise 5-10 times with each hand. Thumb motion 1. Sit with your forearm resting on a table and your wrist straight. Your thumb should be facing up toward the ceiling. Keep your fingers relaxed as you move your thumb. 2. Lift your thumb up as high as you can toward the ceiling. Hold for __________ seconds. 3. Bend your thumb across your palm as far as you can, reaching the tip of your thumb for the small finger (pinkie) side of your palm. Hold for __________ seconds. Repeat this exercise 5-10 times with each hand. Grip strengthening  1. Hold a stress ball or other soft ball in the middle of your hand. 2. Slowly increase the pressure, squeezing the ball as much as you can without causing pain. Think of bringing the tips of your fingers into the middle of your palm. All of your finger joints should bend when doing this exercise. 3. Hold your squeeze for __________ seconds, then relax. Repeat this exercise 5-10 times with each hand. Contact a health care provider if:  Your hand pain or discomfort gets much worse when you do an exercise.  Your hand pain or discomfort does not improve within 2 hours after you exercise. If you have any of these problems, stop doing these exercises right away. Do not do them again unless your health care provider says that you can. Get help right away if:  You develop sudden, severe hand pain or swelling. If this happens, stop doing these exercises right away. Do not do them again unless your health care provider says that you can. This information is not intended to replace advice given to you by your health care provider. Make sure you discuss any questions you have with your health care provider. Document Revised: 10/28/2018 Document Reviewed: 07/08/2018 Elsevier Patient Education  2020 [employer]  

## 2020-04-30 ENCOUNTER — Encounter: Payer: Self-pay | Admitting: Physical Therapy

## 2020-04-30 ENCOUNTER — Other Ambulatory Visit: Payer: Self-pay

## 2020-04-30 ENCOUNTER — Ambulatory Visit: Payer: Medicare Other | Admitting: Physical Therapy

## 2020-04-30 DIAGNOSIS — R262 Difficulty in walking, not elsewhere classified: Secondary | ICD-10-CM

## 2020-04-30 DIAGNOSIS — R252 Cramp and spasm: Secondary | ICD-10-CM | POA: Diagnosis not present

## 2020-04-30 DIAGNOSIS — G8929 Other chronic pain: Secondary | ICD-10-CM

## 2020-04-30 DIAGNOSIS — M542 Cervicalgia: Secondary | ICD-10-CM

## 2020-04-30 DIAGNOSIS — M6281 Muscle weakness (generalized): Secondary | ICD-10-CM | POA: Diagnosis not present

## 2020-04-30 DIAGNOSIS — M545 Low back pain, unspecified: Secondary | ICD-10-CM

## 2020-04-30 NOTE — Therapy (Signed)
[place], [place], [address] Phone: [phone]   Fax:  [phone]  Physical Therapy Treatment  Patient Details  Name: Dawn [name] MRN: [id number] Date of Birth: Mar 08, 1946 Referring Provider (PT): Dr Christell Constant   Encounter Date: 04/30/2020   PT End of Session - 04/30/20 1424    Visit Number 43    Number of Visits 45    Date for PT Re-Evaluation 05/16/20    Authorization Type progress not eperfromed on visit 37 next on 76    PT Start Time 1100    PT Stop Time 1143    PT Time Calculation (min) 43 min    Activity Tolerance Patient tolerated treatment well    Behavior During Therapy [facility] for tasks assessed/performed           Past Medical History:  Diagnosis Date   Allergy    Essential hypertension 08/25/2018   Hot flashes, menopausal 10/13/2011   Estradiol started    Memory loss 09/21/2019   Multiple sclerosis ([place])    Neuropathy    Osteoarthritis    Vision abnormalities     Past Surgical History:  Procedure Laterality Date   ABDOMINAL HYSTERECTOMY  2002   partial   LUMBAR FUSION      There were no vitals filed for this visit.   Subjective Assessment - 04/30/20 1103    Subjective Patient reports the pain continues to improve. she is having tightness across the mdiddle of he back but the intensity and frequency of her pain is decreasing.    Pertinent History MS    How long can you stand comfortably? hurts as soon as she stands. Has to shift frequently    How long can you walk comfortably? limited community ambualtion    Diagnostic tests Nothing recent    Currently in Pain? Yes    Pain Score 4     Pain Location Back    Pain Orientation Right    Pain Descriptors / Indicators Aching    Pain Type Chronic pain    Pain Onset More than a month ago    Pain Frequency Constant    Aggravating Factors  standing and walking    Pain Relieving Factors rest and stretching    Effect  of Pain on Daily Activities difficulty perfroming ADL's    Multiple Pain Sites No              [facility] PT Assessment - 04/30/20 0001      AROM   Lumbar Flexion 75     Lumbar - Left Side Bend improved pain       Strength   Right Shoulder Flexion 4+/5    Right Shoulder Internal Rotation 4+/5    Right Shoulder External Rotation 4+/5    Left Shoulder Flexion 4+/5    Left Shoulder External Rotation 4+/5                         [facility] Adult PT Treatment/Exercise - 04/30/20 0001      Lumbar Exercises: Supine   Pelvic Tilt Limitations x10     Other Supine Lumbar Exercises supine march 2x10       Manual Therapy   Joint Mobilization PA glides form L3-L5     Soft tissue mobilization roller to bilateral gluteals and IT bands / IASTYM to paraspainals     Manual Traction LAD Grade II-III  Trigger Point Dry Needling - 04/30/20 0001    Consent Given? Yes    Education Handout Provided Previously provided    Muscles Treated Back/Hip Piriformis    Other Dry Needling 2  spots L3L2 and L3 bilateral     Lumbar multifidi Response Twitch response elicited;Palpable increased muscle length                PT Education - 04/30/20 1424    Education Details reviewed HEP and symptom management    Person(s) Educated Patient    Methods Explanation;Demonstration;Tactile cues;Verbal cues    Comprehension Verbalized understanding;Tactile cues required;Returned demonstration;Verbal cues required            PT Short Term Goals - 03/28/20 1446      PT SHORT TERM GOAL #1   Title Patient will report no pain radiating into anterior thigh    Baseline no    Time 4    Period Weeks    Status Achieved    Target Date 12/09/19      PT SHORT TERM GOAL #2   Title Patient will increase bilateral LE strength to 4/5    Baseline 4+/5 gross    Time 4    Period Weeks    Status Achieved      PT SHORT TERM GOAL #3   Title Patient will increase passive right hip flexion to  105 degrrees    Baseline 100 today with pain onthe right side    Time 4    Period Weeks    Status On-going      PT SHORT TERM GOAL #4   Title Patient will increase right cervical rotation by 10 degrees    Baseline to 58 degres today    Time 4    Period Weeks    Status On-going    Target Date 01/09/20      PT SHORT TERM GOAL #5   Title Patient will increase ross bilateral UE strength to 4+/5    Baseline improved to 4/5    Time 4    Period Weeks    Status On-going             PT Long Term Goals - 04/30/20 1430      PT LONG TERM GOAL #1   Title Patient will stand for 30 min without self reported increase pain in order to stand at a kitchen sink.    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     Baseline pain at edn range hip flexion and ir but intensity decreased    Time 6    Period Weeks    Status On-going      PT LONG TERM GOAL #4   Title Patient will reach behind his head without pain in order to perfrom ADL's    Baseline mild pain and tightness but improved    Time 6    Period Weeks  Status On-going      PT LONG TERM GOAL #5   Title Patient will report no radicular pain into her hands    Baseline more intermittent    Time 6    Period Weeks    Status On-going      PT LONG TERM GOAL #6   Title POatient will improve ABC score to 50% function in order to decrease her risk of fall    Baseline 40%    Period Weeks    Status On-going                 Plan - 04/30/20 1425    Clinical Impression Statement Spadsming in patients lumbar spine has improved. She feels  like therapy is keeping her from having to get injections. Current flair-up has improved significantly. Therapy perfromed TPDN of themid lumbar spine. Therapy also reviewed exercises and balance activity. therapy will continue to perogress as tolerated.    Personal Factors and Comorbidities Comorbidity 1;Comorbidity 2    Comorbidities MS, OA    Examination-Activity Limitations Locomotion Level;Carry;Stand;Lift;Stairs;Squat    Examination-Participation Restrictions Meal Prep;Cleaning;Community Activity;Laundry;Shop    Stability/Clinical Decision Making Stable/Uncomplicated    Clinical Decision Making Low    Rehab Potential Poor    PT Frequency 1x / week    PT Duration 8 weeks    PT Treatment/Interventions ADLs/Self Care Home Management;Electrical Stimulation;Cryotherapy;Iontophoresis 4mg /ml Dexamethasone;Moist Heat;Traction;DME Instruction;Neuromuscular re-education;Patient/family education;Manual techniques;Passive range of motion;Taping    PT Next Visit Plan work on balance with patient,    PT Home Exercise Plan LTR; hamstring stretch, tennis ball trigger point release ; abdominal breathing;Access Code: [email or web address] Upper Trapezius Stretch - 1 x daily - 7 x weekly - 1 sets - 3 reps - 20 holdGentle Levator Scapulae Stretch - 1 x daily - 7 x weekly - 1 sets - 3 reps - 20 holdShoulder extension with resistance - Neutral - 1 x daily - 7 x weekly - 3 sets - 10 repsScapular Retraction with Resistance - 1 x daily - 7 x weekly - 3 sets - 10 reps    Consulted and Agree with Plan of Care Patient           Patient will benefit from skilled therapeutic intervention in order to improve the following deficits and impairments:  Abnormal gait, Difficulty walking, Decreased range of motion, Decreased mobility, Decreased strength, Postural dysfunction, Pain, Increased muscle spasms  Visit Diagnosis: Chronic bilateral low back pain without sciatica  Muscle weakness (generalized)  Cramp and  spasm  Difficulty in walking, not elsewhere classified  Cervicalgia     Problem List Patient Active Problem List   Diagnosis Date Noted   Sacroiliitis, not elsewhere classified ([place]) 12/22/2019   Other secondary scoliosis, lumbar region 12/22/2019   Arthritis of facet joint of lumbar spine 12/22/2019   Cervical radiculopathy 10/18/2019   Numbness 09/21/2019   Bilateral carpal tunnel syndrome 09/21/2019   High risk medication use 09/21/2019   Memory loss 09/21/2019   Essential hypertension 08/25/2018   Abnormal SPEP 02/19/2018   Hand pain 02/20/2017   Multiple joint pain 02/18/2017   Right elbow pain 12/09/2016   Disturbed cognition 06/25/2016   Trochanteric bursitis of left hip 02/18/2016   Sciatica, right side 10/30/2015   Bilateral arm pain 10/10/2015   Trochanteric bursitis of right hip 08/04/2014   Adjustment disorder with mixed anxiety and depressed mood 08/04/2014   Chronic fatigue 08/04/2014   Urinary frequency 08/04/2014   Abnormality of gait 08/04/2014   Unspecified visual disturbance  01/31/2013   Transient vision disturbance 01/31/2013   Routine general medical examination at a health care facility 11/04/2011   Colon polyps 11/03/2011   Hot flashes, menopausal 10/13/2011   POSTHERPETIC NEURALGIA 10/03/2009   DISPLCMT LUMBAR INTERVERT 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 [place], [place], [address] Phone: [phone]   Fax:  [phone]  Name: PHILA SHOAF MRN: [id number] Date of Birth: 11-11-1945

## 2020-05-01 DIAGNOSIS — R300 Dysuria: Secondary | ICD-10-CM | POA: Diagnosis not present

## 2020-05-01 DIAGNOSIS — N950 Postmenopausal bleeding: Secondary | ICD-10-CM | POA: Diagnosis not present

## 2020-05-01 NOTE — Progress Notes (Signed)
I will discuss results at the fu visit.

## 2020-05-03 LAB — C3 AND C4
C3 Complement: 164 mg/dL (ref 83–193)
C4 Complement: 28 mg/dL (ref 15–57)

## 2020-05-03 LAB — RNP ANTIBODY: Ribonucleic Protein(ENA) Antibody, IgG: <1 AI

## 2020-05-03 LAB — ANA: Anti Nuclear Antibody (ANA): POSITIVE — AB

## 2020-05-03 LAB — ANTI-NUCLEAR AB-TITER (ANA TITER)
ANA TITER: 1:80 {titer} — ABNORMAL HIGH
ANA Titer 1: 1:40 {titer} — ABNORMAL HIGH

## 2020-05-03 LAB — CYCLIC CITRUL PEPTIDE ANTIBODY, IGG: Cyclic Citrullin Peptide Ab: <16 UNITS

## 2020-05-03 LAB — ANTI-SMITH ANTIBODY: ENA SM Ab Ser-aCnc: <1 AI

## 2020-05-03 LAB — SJOGRENS SYNDROME-A EXTRACTABLE NUCLEAR ANTIBODY: SSA (Ro) (ENA) Antibody, IgG: 5.3 AI — AB

## 2020-05-03 LAB — RHEUMATOID FACTOR: Rheumatoid fact SerPl-aCnc: <14 IU/mL (ref <14)

## 2020-05-03 LAB — ANTI-DNA ANTIBODY, DOUBLE-STRANDED: ds DNA Ab: <1 IU/mL

## 2020-05-03 LAB — SJOGRENS SYNDROME-B EXTRACTABLE NUCLEAR ANTIBODY: SSB (La) (ENA) Antibody, IgG: <1 AI

## 2020-05-04 DIAGNOSIS — N840 Polyp of corpus uteri: Secondary | ICD-10-CM | POA: Diagnosis not present

## 2020-05-04 DIAGNOSIS — R9389 Abnormal findings on diagnostic imaging of other specified body structures: Secondary | ICD-10-CM | POA: Diagnosis not present

## 2020-05-04 DIAGNOSIS — N950 Postmenopausal bleeding: Secondary | ICD-10-CM | POA: Diagnosis not present

## 2020-05-07 ENCOUNTER — Ambulatory Visit: Payer: Medicare Other | Admitting: Physical Therapy

## 2020-05-07 ENCOUNTER — Other Ambulatory Visit: Payer: Self-pay

## 2020-05-07 ENCOUNTER — Encounter: Payer: Self-pay | Admitting: Physical Therapy

## 2020-05-07 DIAGNOSIS — M542 Cervicalgia: Secondary | ICD-10-CM | POA: Diagnosis not present

## 2020-05-07 DIAGNOSIS — R262 Difficulty in walking, not elsewhere classified: Secondary | ICD-10-CM | POA: Diagnosis not present

## 2020-05-07 DIAGNOSIS — G8929 Other chronic pain: Secondary | ICD-10-CM

## 2020-05-07 DIAGNOSIS — M6281 Muscle weakness (generalized): Secondary | ICD-10-CM | POA: Diagnosis not present

## 2020-05-07 DIAGNOSIS — R252 Cramp and spasm: Secondary | ICD-10-CM | POA: Diagnosis not present

## 2020-05-07 DIAGNOSIS — M545 Low back pain, unspecified: Secondary | ICD-10-CM | POA: Diagnosis not present

## 2020-05-07 NOTE — Therapy (Signed)
[place], [place], [address] Phone: [phone]   Fax:  [phone]  Physical Therapy Treatment  Patient Details  Name: Dawn [name] MRN: [id number] Date of Birth: October 06, 1945 Referring Provider (PT): Dr Christell Constant   Encounter Date: 05/07/2020   PT End of Session - 05/07/20 1050    Visit Number 44    Number of Visits 45    Date for PT Re-Evaluation 05/16/20    Authorization Type progress not eperfromed on visit 37 next on 73    PT Start Time 1015    PT Stop Time 1057    PT Time Calculation (min) 42 min    Activity Tolerance Patient tolerated treatment well    Behavior During Therapy [facility] for tasks assessed/performed           Past Medical History:  Diagnosis Date   Allergy    Essential hypertension 08/25/2018   Hot flashes, menopausal 10/13/2011   Estradiol started    Memory loss 09/21/2019   Multiple sclerosis ([place])    Neuropathy    Osteoarthritis    Vision abnormalities     Past Surgical History:  Procedure Laterality Date   ABDOMINAL HYSTERECTOMY  2002   partial   LUMBAR FUSION      There were no vitals filed for this visit.   Subjective Assessment - 05/07/20 1022    Subjective Patient reports continued toghtness across the middle of her back. She is otherwise doing better.  She feels like the splint is helping her hand.    How long can you stand comfortably? hurts as soon as she stands. Has to shift frequently    How long can you walk comfortably? limited community ambualtion    Diagnostic tests Nothing recent    Currently in Pain? Yes    Pain Score 3     Pain Location Back    Pain Orientation Right;Left    Pain Descriptors / Indicators Aching    Pain Type Chronic pain    Pain Radiating Towards into the right gluteal    Pain Onset More than a month ago    Pain Frequency Constant    Aggravating Factors  standing and walking    Pain Relieving Factors rest and  stretching    Effect of Pain on Daily Activities difficulty    Multiple Pain Sites No                             [facility] Adult PT Treatment/Exercise - 05/07/20 0001      Lumbar Exercises: Standing   Other Standing Lumbar Exercises heel rasie x20; marching x20 each leg. Walking hurdles 3 laps in II bars; side stepping  3 full laps in III bars        Lumbar Exercises: Seated   Other Seated Lumbar Exercises LAQ red 2x10; hamstring curl 2x10 seated clamshell x20 red     Other Seated Lumbar Exercises ;      Manual Therapy   Joint Mobilization PA glides form L3-L5     Soft tissue mobilization roller to bilateral gluteals and IT bands / IASTYM to paraspainals     Manual Traction LAD Grade II-III                    PT Short Term Goals - 03/28/20 1446      PT SHORT TERM GOAL #1   Title Patient will report no  pain radiating into anterior thigh    Baseline no    Time 4    Period Weeks    Status Achieved    Target Date 12/09/19      PT SHORT TERM GOAL #2   Title Patient will increase bilateral LE strength to 4/5    Baseline 4+/5 gross    Time 4    Period Weeks    Status Achieved      PT SHORT TERM GOAL #3   Title Patient will increase passive right hip flexion to 105 degrrees    Baseline 100 today with pain onthe right side    Time 4    Period Weeks    Status On-going      PT SHORT TERM GOAL #4   Title Patient will increase right cervical rotation by 10 degrees    Baseline to 58 degres today    Time 4    Period Weeks    Status On-going    Target Date 01/09/20      PT SHORT TERM GOAL #5   Title Patient will increase ross bilateral UE strength to 4+/5    Baseline improved to 4/5    Time 4    Period Weeks    Status On-going             PT Long Term Goals - 04/30/20 1430      PT LONG TERM GOAL #1   Title Patient will stand for 30 min without self reported increase pain in order to stand at a kitchen sink.    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     Baseline pain at edn range hip flexion and ir but intensity decreased    Time 6    Period Weeks    Status On-going      PT LONG TERM GOAL #4   Title Patient will reach behind his head without pain in order to perfrom ADL's    Baseline mild pain and tightness but improved    Time 6    Period Weeks    Status On-going      PT LONG TERM GOAL #5   Title Patient will report no radicular pain into her hands    Baseline more intermittent    Time 6    Period Weeks    Status On-going      PT LONG TERM GOAL #6   Title POatient will improve ABC score to 50% function in order to decrease her risk of fall    Baseline 40%    Period Weeks    Status On-going                 Plan - 05/07/20 1212    Clinical Impression Statement Patients spasming conitinues to decrease. Therapy focused on manual therapy to reduce spasming in the back. Therapy also added in mvoement drills. she was able to perfrom walking  hudles and side stepping over hurdles. She perefromed light standing ther-ex as well. She had no increase inpain with standing exercises. Ther-ex will continue to progress as tolerated.    Personal Factors and Comorbidities Comorbidity 1;Comorbidity 2    Comorbidities MS, OA    Examination-Activity Limitations Locomotion Level;Carry;Stand;Lift;Stairs;Squat    Examination-Participation Restrictions Meal Prep;Cleaning;Community Activity;Laundry;Shop    Clinical Decision Making Low    Rehab Potential Poor    PT Frequency 1x / week    PT Duration 8 weeks    PT  Treatment/Interventions ADLs/Self Care Home Management;Electrical Stimulation;Cryotherapy;Iontophoresis 4mg /ml Dexamethasone;Moist Heat;Traction;DME Instruction;Neuromuscular re-education;Patient/family education;Manual techniques;Passive range of motion;Taping    PT Next Visit Plan work on balance with patient,    PT Home Exercise Plan LTR; hamstring stretch, tennis ball trigger point release ; abdominal breathing;Access Code: [email or web address] Upper Trapezius Stretch - 1 x daily - 7 x weekly - 1 sets - 3 reps - 20 holdGentle Levator Scapulae Stretch - 1 x daily - 7 x weekly - 1 sets - 3 reps - 20 holdShoulder extension with resistance - Neutral - 1 x daily - 7 x weekly - 3 sets - 10 repsScapular Retraction with Resistance - 1 x daily - 7 x weekly - 3 sets - 10 reps    Consulted and Agree with Plan of Care Patient           Patient will benefit from skilled therapeutic intervention in order to improve the following deficits and impairments:  Abnormal gait, Difficulty walking, Decreased range of motion, Decreased mobility, Decreased strength, Postural dysfunction, Pain, Increased muscle spasms  Visit Diagnosis: Chronic bilateral low back pain without sciatica  Muscle weakness (generalized)  Cramp and spasm  Difficulty in walking, not elsewhere classified  Cervicalgia     Problem List Patient Active Problem List   Diagnosis Date Noted   Sacroiliitis, not elsewhere classified ([place]) 12/22/2019   Other secondary scoliosis, lumbar region 12/22/2019   Arthritis of facet joint of lumbar spine 12/22/2019   Cervical radiculopathy 10/18/2019   Numbness 09/21/2019   Bilateral carpal tunnel syndrome 09/21/2019   High risk medication use 09/21/2019   Memory loss 09/21/2019   Essential hypertension 08/25/2018   Abnormal SPEP 02/19/2018   Hand pain 02/20/2017   Multiple joint pain 02/18/2017   Right elbow pain 12/09/2016   Disturbed cognition 06/25/2016    Trochanteric bursitis of left hip 02/18/2016   Sciatica, right side 10/30/2015   Bilateral arm pain 10/10/2015   Trochanteric bursitis of right hip 08/04/2014   Adjustment disorder with mixed anxiety and depressed mood 08/04/2014   Chronic fatigue 08/04/2014   Urinary frequency 08/04/2014   Abnormality of gait 08/04/2014   Unspecified visual disturbance 01/31/2013   Transient vision disturbance 01/31/2013   Routine general medical examination at a health care facility 11/04/2011   Colon polyps 11/03/2011   Hot flashes, menopausal 10/13/2011   POSTHERPETIC NEURALGIA 10/03/2009   DISPLCMT LUMBAR INTERVERT DISC W/O MYELOPATHY 09/05/2009   RENAL CALCULUS, RECURRENT 09/04/2009   Hyperlipidemia 08/31/2009   MULTIPLE SCLEROSIS, PROGRESSIVE/RELAPSING 08/31/2009   ALLERGIC RHINITIS 08/31/2009    Carney Living PT DPT  05/07/2020, 12:19 PM  [facility] [address] [place], [place], [address] Phone: [phone]   Fax:  [phone]  Name: LARAYNE BAXLEY MRN: [id number] Date of Birth: 06-06-1946

## 2020-05-13 ENCOUNTER — Other Ambulatory Visit: Payer: Self-pay | Admitting: Neurology

## 2020-05-14 ENCOUNTER — Ambulatory Visit: Payer: Medicare Other | Admitting: Physical Therapy

## 2020-05-14 ENCOUNTER — Encounter: Payer: Self-pay | Admitting: Physical Therapy

## 2020-05-14 ENCOUNTER — Other Ambulatory Visit: Payer: Self-pay

## 2020-05-14 DIAGNOSIS — R262 Difficulty in walking, not elsewhere classified: Secondary | ICD-10-CM | POA: Diagnosis not present

## 2020-05-14 DIAGNOSIS — R252 Cramp and spasm: Secondary | ICD-10-CM | POA: Diagnosis not present

## 2020-05-14 DIAGNOSIS — M6281 Muscle weakness (generalized): Secondary | ICD-10-CM

## 2020-05-14 DIAGNOSIS — M542 Cervicalgia: Secondary | ICD-10-CM | POA: Diagnosis not present

## 2020-05-14 DIAGNOSIS — G8929 Other chronic pain: Secondary | ICD-10-CM

## 2020-05-14 DIAGNOSIS — M545 Low back pain, unspecified: Secondary | ICD-10-CM | POA: Diagnosis not present

## 2020-05-14 NOTE — Therapy (Addendum)
[place], [place], [address] Phone: [phone]   Fax:  [phone]  Physical Therapy Treatment/Progress Note   Patient Details  Name: Dawn [name] MRN: [id number] Date of Birth: [birth year] Referring Provider (PT): Dr Christell Constant  Progress Note Reporting Period 03/21/2020  to 05/16/2020  See note below for Objective Data and Assessment of Progress/Goals.       Encounter Date: 05/14/2020   PT End of Session - 05/14/20 1120    Visit Number 45    Number of Visits 53    Date for PT Re-Evaluation 07/09/20    Authorization Type progress note done on 96 th visit Next at 67    PT Start Time 1015    PT Stop Time 1107    PT Time Calculation (min) 52 min    Equipment Utilized During Treatment Gait belt    Activity Tolerance Patient tolerated treatment well    Behavior During Therapy WFL for tasks assessed/performed           Past Medical History:  Diagnosis Date  . Allergy   . Essential hypertension 08/25/2018  . Hot flashes, menopausal 10/13/2011   Estradiol started   . Memory loss 09/21/2019  . Multiple sclerosis ([place])   . Neuropathy   . Osteoarthritis   . Vision abnormalities     Past Surgical History:  Procedure Laterality Date  . ABDOMINAL HYSTERECTOMY  2002   partial  . LUMBAR FUSION      There were no vitals filed for this visit.   Subjective Assessment - 05/14/20 1115    Subjective Pt reports tightness bilaterally across low back    Pertinent History MS    How long can you stand comfortably? hurts as soon as she stands. Has to shift frequently    How long can you walk comfortably? limited community ambualtion    Diagnostic tests Nothing recent    Currently in Pain? Yes    Pain Score 4     Pain Location Back    Pain Orientation Right;Left    Pain Descriptors / Indicators Aching    Pain Type Chronic pain    Pain Onset More than a month ago    Pain Frequency Constant     Aggravating Factors  standing and walking    Pain Relieving Factors rest and stretching    Effect of Pain on Daily Activities difficulty    Multiple Pain Sites No    Pain Onset More than a month ago              [facility] PT Assessment - 05/14/20 0001      Transfers   Comments 5x 18 sec       Ambulation/Gait   Gait Comments walking speed test 51M, 6, 7, 7      Standardized Balance Assessment   Standardized Balance Assessment Berg Balance Test      Berg Balance Test   Sit to Stand Able to stand without using hands and stabilize independently    Standing Unsupported Able to stand safely 2 minutes    Sitting with Back Unsupported but Feet Supported on Floor or Stool Able to sit safely and securely 2 minutes    Stand to Sit Sits safely with minimal use of hands    Transfers Able to transfer safely, minor use of hands    Standing Unsupported with Eyes Closed Able to stand 10 seconds with supervision    Standing Unsupported with Feet Together  Able to place feet together independently and stand for 1 minute with supervision    From Standing, Reach Forward with Outstretched Arm Can reach confidently >25 cm (10")    From Standing Position, Pick up Object from [place] to pick up shoe safely and easily    From Standing Position, Turn to Look Behind Over each Shoulder Looks behind from both sides and weight shifts well    Turn 360 Degrees Able to turn 360 degrees safely but slowly    Standing Unsupported, Alternately Place Feet on Step/Stool Able to complete >2 steps/needs minimal assist    Standing Unsupported, One Foot in [place] to take small step independently and hold 30 seconds    Standing on One Leg Tries to lift leg/unable to hold 3 seconds but remains standing independently    Total Score 44                         [facility] Adult PT Treatment/Exercise - 05/14/20 0001      Self-Care   Other Self-Care Comments  reviewed plan going forward and reviewed progress with  testing       Manual Therapy   Manual Therapy Soft tissue mobilization   Lumbar paraspinals L1-L5   Manual therapy comments skilled palpation of trigger points     Joint Mobilization Long axis distraction   R grade I&II, L grade II&III   Soft tissue mobilization softt tissue mobilization to bilateral lumbar spine and bilateral gluteals            Trigger Point Dry Needling - 05/14/20 0001    Consent Given? Yes    Education Handout Provided Previously provided    Other Dry Needling 2 spots at L1-L2 bilateral usoing a .30x30 needle;  1 spot in glut med bilateral using a .30x75 needle    Gluteus Medius Response Twitch response elicited    Lumbar multifidi Response Twitch response elicited;Palpable increased muscle length                PT Education - 05/14/20 1119    Education Details Education on long-axis hip distraction and their current R-lateral labral tear.    Person(s) Educated Patient    Methods Explanation    Comprehension Verbalized understanding            PT Short Term Goals - 03/28/20 1446      PT SHORT TERM GOAL #1   Title Patient will report no pain radiating into anterior thigh    Baseline no    Time 4    Period Weeks    Status Achieved    Target Date 12/09/19      PT SHORT TERM GOAL #2   Title Patient will increase bilateral LE strength to 4/5    Baseline 4+/5 gross    Time 4    Period Weeks    Status Achieved      PT SHORT TERM GOAL #3   Title Patient will increase passive right hip flexion to 105 degrrees    Baseline 100 today with pain onthe right side    Time 4    Period Weeks    Status On-going      PT SHORT TERM GOAL #4   Title Patient will increase right cervical rotation by 10 degrees    Baseline to 58 degres today    Time 4    Period Weeks    Status On-going    Target Date 01/09/20  PT SHORT TERM GOAL #5   Title Patient will increase ross bilateral UE strength to 4+/5    Baseline improved to 4/5    Time 4    Period  Weeks    Status On-going             PT Long Term Goals - 04/30/20 1430      PT LONG TERM GOAL #1   Title Patient will stand for 30 min without self reported increase pain in order to stand at a kitchen sink.    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     Baseline pain at edn range hip flexion and ir but intensity decreased    Time 6    Period Weeks    Status On-going      PT LONG TERM GOAL #4   Title Patient will reach behind his head without pain in order to perfrom ADL's    Baseline mild pain and tightness but improved    Time 6    Period Weeks    Status On-going      PT LONG TERM GOAL #5   Title Patient will report no radicular pain into her hands    Baseline more intermittent    Time 6    Period Weeks    Status On-going      PT LONG TERM GOAL #6   Title POatient will improve ABC score to 50% function in order to decrease her risk of fall    Baseline 40%    Period Weeks    Status On-going                 Plan - 05/14/20 1140    Clinical Impression Statement Patient underwent re-evaluation today. Pt scored a 44/56 on the BERG balance assessment with the lowest scores for single-leg stance and stair tapping. Pt underwent TPDN and it was well tolerated, as was STW to the lumbar paraspinals and long-axis distraction BLE. Pt will continue to benefit from therapy to address the balance impairments from the BERG assessment as well as to maintain functional gains to BLE strength to maintain independence with ADLs. Patient dmeonstrated improved 5x sit to stand time and improved  gait speed. Overall she feels improved endurance. She continues to work on her HEP and perfrom exercises in the pol and attend ti-chi    Personal Factors and Comorbidities Comorbidity 1;Comorbidity 2    Comorbidities MS, OA    Examination-Activity Limitations Locomotion Level;Carry;Stand;Lift;Stairs;Squat    Examination-Participation Restrictions Meal Prep;Cleaning;Community Activity;Laundry;Shop    Stability/Clinical Decision Making Stable/Uncomplicated    Clinical Decision Making Low    Rehab Potential Poor    PT Frequency 1x / week    PT Duration 8 weeks    PT Treatment/Interventions ADLs/Self Care Home Management;Electrical Stimulation;Cryotherapy;Iontophoresis 4mg /ml Dexamethasone;Moist Heat;Traction;DME Instruction;Neuromuscular re-education;Patient/family education;Manual techniques;Passive range of motion;Taping    PT Next Visit Plan work on balance with patient,  PT Home Exercise Plan LTR; hamstring stretch, tennis ball trigger point release ; abdominal breathing;Access Code: [email or web address].Seated Upper Trapezius Stretch - 1 x daily - 7 x weekly - 1 sets - 3 reps - 20 hold.Gentle Levator Scapulae Stretch - 1 x daily - 7 x weekly - 1 sets - 3 reps - 20 hold.Shoulder extension with resistance - Neutral - 1 x daily - 7 x weekly - 3 sets - 10 reps.Scapular Retraction with Resistance - 1 x daily - 7 x weekly - 3 sets - 10 reps    Consulted and Agree with Plan of Care Patient           Patient will benefit from skilled therapeutic intervention in order to improve the following deficits and impairments:  Abnormal gait, Difficulty walking, Decreased range of motion, Decreased mobility, Decreased strength, Postural dysfunction, Pain, Increased muscle spasms  Visit Diagnosis: Chronic bilateral low back pain without sciatica  Muscle weakness (generalized)  Cramp and spasm  Difficulty in walking, not elsewhere classified     Problem List Patient Active Problem List   Diagnosis  Date Noted  . Sacroiliitis, not elsewhere classified ([place]) 12/22/2019  . Other secondary scoliosis, lumbar region 12/22/2019  . Arthritis of facet joint of lumbar spine 12/22/2019  . Cervical radiculopathy 10/18/2019  . Numbness 09/21/2019  . Bilateral carpal tunnel syndrome 09/21/2019  . High risk medication use 09/21/2019  . Memory loss 09/21/2019  . Essential hypertension 08/25/2018  . Abnormal SPEP 02/19/2018  . Hand pain 02/20/2017  . Multiple joint pain 02/18/2017  . Right elbow pain 12/09/2016  . Disturbed cognition 06/25/2016  . Trochanteric bursitis of left hip 02/18/2016  . Sciatica, right side 10/30/2015  . Bilateral arm pain 10/10/2015  . Trochanteric bursitis of right hip 08/04/2014  . Adjustment disorder with mixed anxiety and depressed mood 08/04/2014  . Chronic fatigue 08/04/2014  . Urinary frequency 08/04/2014  . Abnormality of gait 08/04/2014  . Unspecified visual disturbance 01/31/2013  . Transient vision disturbance 01/31/2013  . Routine general medical examination at a health care facility 11/04/2011  . Colon polyps 11/03/2011  . Hot flashes, menopausal 10/13/2011  . POSTHERPETIC NEURALGIA 10/03/2009  . DISPLCMT LUMBAR INTERVERT [place] W/O MYELOPATHY 09/05/2009  . RENAL CALCULUS, RECURRENT 09/04/2009  . Hyperlipidemia 08/31/2009  . MULTIPLE SCLEROSIS, PROGRESSIVE/RELAPSING 08/31/2009  . ALLERGIC RHINITIS 08/31/2009   Carolyne Littles PT  05/14/2020   Dawayne Cirri , SPT 05/14/2020, 2:13 PM  [facility] [address] [place], [place], [address] Phone: [phone]   Fax:  [phone]  Name: Dawn [name] MRN: [id number] Date of Birth: [birth year]

## 2020-05-14 NOTE — Progress Notes (Deleted)
Office Visit Note  Patient: Dawn [name]             Date of Birth: 11-30-1945           MRN: [id number]             PCP: Gayland Curry, DO Referring: Gayland Curry, DO Visit Date: 05/25/2020 Occupation: '@GUAROCC' @  Subjective:  No chief complaint on file.   History of Present Illness: Dawn [name] is a 74 y.o. female ***   Activities of Daily Living:  Patient reports morning stiffness for *** {minute/hour:19697}.   Patient {ACTIONS;DENIES/REPORTS:21021675::"Denies"} nocturnal pain.  Difficulty dressing/grooming: {ACTIONS;DENIES/REPORTS:21021675::"Denies"} Difficulty climbing stairs: {ACTIONS;DENIES/REPORTS:21021675::"Denies"} Difficulty getting out of chair: {ACTIONS;DENIES/REPORTS:21021675::"Denies"} Difficulty using hands for taps, buttons, cutlery, and/or writing: {ACTIONS;DENIES/REPORTS:21021675::"Denies"}  No Rheumatology ROS completed.   PMFS History:  Patient Active Problem List   Diagnosis Date Noted  . Sacroiliitis, not elsewhere classified ([place]) 12/22/2019  . Other secondary scoliosis, lumbar region 12/22/2019  . Arthritis of facet joint of lumbar spine 12/22/2019  . Cervical radiculopathy 10/18/2019  . Numbness 09/21/2019  . Bilateral carpal tunnel syndrome 09/21/2019  . High risk medication use 09/21/2019  . Memory loss 09/21/2019  . Essential hypertension 08/25/2018  . Abnormal SPEP 02/19/2018  . Hand pain 02/20/2017  . Multiple joint pain 02/18/2017  . Right elbow pain 12/09/2016  . Disturbed cognition 06/25/2016  . Trochanteric bursitis of left hip 02/18/2016  . Sciatica, right side 10/30/2015  . Bilateral arm pain 10/10/2015  . Trochanteric bursitis of right hip 08/04/2014  . Adjustment disorder with mixed anxiety and depressed mood 08/04/2014  . Chronic fatigue 08/04/2014  . Urinary frequency 08/04/2014  . Abnormality of gait 08/04/2014  . Unspecified visual disturbance 01/31/2013  . Transient vision disturbance 01/31/2013  .  Routine general medical examination at a health care facility 11/04/2011  . Colon polyps 11/03/2011  . Hot flashes, menopausal 10/13/2011  . POSTHERPETIC NEURALGIA 10/03/2009  . DISPLCMT LUMBAR INTERVERT [place] W/O MYELOPATHY 09/05/2009  . RENAL CALCULUS, RECURRENT 09/04/2009  . Hyperlipidemia 08/31/2009  . MULTIPLE SCLEROSIS, PROGRESSIVE/RELAPSING 08/31/2009  . ALLERGIC RHINITIS 08/31/2009    Past Medical History:  Diagnosis Date  . Allergy   . Essential hypertension 08/25/2018  . Hot flashes, menopausal 10/13/2011   Estradiol started   . Memory loss 09/21/2019  . Multiple sclerosis ([place])   . Neuropathy   . Osteoarthritis   . Vision abnormalities     Family History  Problem Relation Age of Onset  . Heart failure Mother   . Heart failure Father   . Arthritis Other   . Hypertension Other    Past Surgical History:  Procedure Laterality Date  . ABDOMINAL HYSTERECTOMY  2002   partial  . 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     Tobacco use, amount per day now: former   Past tobacco use, amount per day: 1/2-1 packet   How many years did you use tobacco: 12 years   Alcohol use (drinks per week): 0   Diet: mediterranean based   Do you drink/eat things with caffeine: yes   Marital status:        widow  What year were you married? 1987   Do you live in a house, apartment, assisted living, condo, trailer, etc.? house   Is it one or more stories? 1 story   How many persons live in your home? 1   Do you have pets in your home?( please list) no   Current or past profession: Science writer    Do you exercise?          yes                        Type and how often? Tai chi and pt therapy,  stretches etc....   Do you have a living will? yes   Do you have a DNR form?      no                             If not, do you want to discuss one? yes   Do you have signed POA/HPOA forms?      no                  If so, please bring to you appointment   Immunization History  Administered Date(s) Administered  . Fluad Quad(high Dose 65+) 04/01/2019  . Influenza Whole 05/14/2011  . Influenza, High Dose Seasonal PF 04/30/2017, 05/15/2018  . PFIZER SARS-COV-2 Vaccination 08/27/2019, 09/21/2019, 03/07/2020  . Pneumococcal Conjugate-13 08/30/2018  . Pneumococcal Polysaccharide-23 03/21/2009  . Td 12/04/2009  . Zoster 11/29/2009     Objective: Vital Signs: There were no vitals taken for this visit.   Physical Exam   Musculoskeletal Exam: ***  CDAI Exam: CDAI Score: -- Patient Global: --; Provider Global: -- Swollen: --; Tender: -- Joint Exam 05/25/2020   No joint exam has been documented for this visit   There is currently no information documented on the homunculus. Go to the Rheumatology activity and complete the homunculus joint exam.  Investigation: No additional findings.  Imaging: XR Foot 2 Views Left  Result Date: 04/27/2020 First MTP narrowing was noted.  PIP and DIP narrowing was noted.  No other MTP, intertarsal, tibiotalar or subtalar joint space narrowing was noted.  No erosive changes was noted. Impression: These findings are consistent with osteoarthritis of the foot.  XR Foot 2 Views Right  Result Date: 04/27/2020 Severe first MTP narrowing was noted.  PIP and DIP narrowing was noted.  No MTP, intertarsal, tibiotalar or subtalar joint space narrowing was noted.  No erosive changes was noted. Impression: These findings are consistent with osteoarthritis of the foot.  XR Hand 2 View Left  Result Date: 04/27/2020 Severe CMC narrowing and subluxation was noted.  PIP and DIP narrowing was noted.  No MCP, intercarpal or radiocarpal joint space narrowing was noted.   No erosive changes were noted. Impression: These findings are consistent with osteoarthritis of the hand.  XR Hand 2 View Right  Result Date: 04/27/2020 Severe CMC narrowing was noted.  PIP and DIP narrowing was noted.  No MCP, intercarpal or radiocarpal joint space narrowing was noted.  No erosive changes were noted. Impression: These findings are consistent with osteoarthritis of the hand.   Recent Labs: Lab Results  Component Value Date   WBC 5.0 04/04/2020   HGB 13.2 04/04/2020   PLT 211 04/04/2020   NA 139 04/04/2020   K 4.5 04/04/2020   CL 103 04/04/2020   CO2 22 04/04/2020   GLUCOSE  83 04/04/2020   BUN 24 04/04/2020   CREATININE 0.77 04/04/2020   BILITOT 0.2 04/04/2020   ALKPHOS 192 (H) 04/04/2020   AST 38 04/04/2020   ALT 65 (H) 04/04/2020   PROT 6.7 04/04/2020   ALBUMIN 4.5 04/04/2020   CALCIUM 9.5 04/04/2020   GFRAA 89 04/04/2020   QFTBGOLD Negative 08/20/2015  April 27, 2020 ANA 1: 40 speckled, 1: 80 homogeneous, SSA 5.3, (RNP, [place], SSB, dsDNA negative), C3-C4 normal, RF negative, anti-CCP negative  Speciality Comments: No specialty comments available.  Procedures:  No procedures performed Allergies: Patient has no known allergies.   Assessment / Plan:     Visit Diagnoses: No diagnosis found.  Orders: No orders of the defined types were placed in this encounter.  No orders of the defined types were placed in this encounter.   Face-to-face time spent with patient was *** minutes. Greater than 50% of time was spent in counseling and coordination of care.  Follow-Up Instructions: No follow-ups on file.   Bo Merino, MD  Note - This record has been created using Editor, commissioning.  Chart creation errors have been sought, but may not always  have been located. Such creation errors do not reflect on  the standard of medical care.

## 2020-05-21 ENCOUNTER — Ambulatory Visit: Payer: Medicare Other | Attending: Neurology | Admitting: Physical Therapy

## 2020-05-21 ENCOUNTER — Encounter: Payer: Self-pay | Admitting: Internal Medicine

## 2020-05-21 ENCOUNTER — Other Ambulatory Visit: Payer: Self-pay

## 2020-05-21 ENCOUNTER — Ambulatory Visit (INDEPENDENT_AMBULATORY_CARE_PROVIDER_SITE_OTHER): Payer: Medicare Other | Admitting: Internal Medicine

## 2020-05-21 VITALS — BP 138/72 | HR 72 | Temp 97.7°F | Ht 62.0 in | Wt 178.0 lb

## 2020-05-21 DIAGNOSIS — M545 Low back pain, unspecified: Secondary | ICD-10-CM | POA: Diagnosis not present

## 2020-05-21 DIAGNOSIS — R252 Cramp and spasm: Secondary | ICD-10-CM | POA: Diagnosis not present

## 2020-05-21 DIAGNOSIS — M79605 Pain in left leg: Secondary | ICD-10-CM

## 2020-05-21 DIAGNOSIS — G35 Multiple sclerosis: Secondary | ICD-10-CM

## 2020-05-21 DIAGNOSIS — M6281 Muscle weakness (generalized): Secondary | ICD-10-CM | POA: Insufficient documentation

## 2020-05-21 DIAGNOSIS — R5382 Chronic fatigue, unspecified: Secondary | ICD-10-CM | POA: Diagnosis not present

## 2020-05-21 DIAGNOSIS — N840 Polyp of corpus uteri: Secondary | ICD-10-CM | POA: Diagnosis not present

## 2020-05-21 DIAGNOSIS — I10 Essential (primary) hypertension: Secondary | ICD-10-CM | POA: Diagnosis not present

## 2020-05-21 DIAGNOSIS — R262 Difficulty in walking, not elsewhere classified: Secondary | ICD-10-CM

## 2020-05-21 DIAGNOSIS — M542 Cervicalgia: Secondary | ICD-10-CM | POA: Insufficient documentation

## 2020-05-21 DIAGNOSIS — M3501 Sicca syndrome with keratoconjunctivitis: Secondary | ICD-10-CM | POA: Diagnosis not present

## 2020-05-21 DIAGNOSIS — E7800 Pure hypercholesterolemia, unspecified: Secondary | ICD-10-CM

## 2020-05-21 DIAGNOSIS — N951 Menopausal and female climacteric states: Secondary | ICD-10-CM

## 2020-05-21 DIAGNOSIS — G8929 Other chronic pain: Secondary | ICD-10-CM | POA: Diagnosis not present

## 2020-05-21 MED ORDER — LOSARTAN POTASSIUM 50 MG PO TABS: 50.0000 mg | ORAL_TABLET | Freq: Every day | ORAL | 3 refills | Status: DC

## 2020-05-21 NOTE — Patient Instructions (Signed)
Increase losartan to 2 tablets daily (total of 50mg ).  Fat and Cholesterol Restricted Eating Plan Eating a diet that limits fat and cholesterol may help lower your risk for heart disease and other conditions. Your body needs fat and cholesterol for basic functions, but eating too much of these things can be harmful to your health. Your health care provider may order lab tests to check your blood fat (lipid) and cholesterol levels. This helps your health care provider understand your risk for certain conditions and whether you need to make diet changes. Work with your health care provider or dietitian to make an eating plan that is right for you. What are tips for following this plan? General guidelines   If you are overweight, work with your health care provider to lose weight safely. Losing just 5-10% of your body weight can improve your overall health and help prevent diseases such as diabetes and heart disease.  Avoid: ? Foods with added sugar. ? Fried foods. ? Foods that contain partially hydrogenated oils, including stick margarine, some tub margarines, cookies, crackers, and other baked goods.  Limit alcohol intake to no more than 1 drink a day for nonpregnant women and 2 drinks a day for men. One drink equals 12 oz of beer, 5 oz of wine, or 1 oz of hard liquor. Reading food labels  Check food labels for: ? Trans fats, partially hydrogenated oils, or high amounts of saturated fat. Avoid foods that contain saturated fat and trans fat. ? The amount of cholesterol in each serving. Try to eat no more than 200 mg of cholesterol each day. ? The amount of fiber in each serving. Try to eat at least 20-30 g of fiber each day.  Choose foods with healthy fats, such as: ? Monounsaturated and polyunsaturated fats. These include olive and canola oil, flaxseeds, walnuts, almonds, and seeds. ? Omega-3 fats. These are found in foods such as salmon, mackerel, sardines, tuna, flaxseed oil, and ground  flaxseeds.  Choose grain products that have whole grains. Look for the word "whole" as the first word in the ingredient list. Cooking  Cook foods using methods other than frying. Baking, boiling, grilling, and broiling are some healthy options.  Eat more home-cooked food and less restaurant, buffet, and fast food.  Avoid cooking using saturated fats. ? Animal sources of saturated fats include meats, butter, and cream. ? Plant sources of saturated fats include palm oil, palm kernel oil, and coconut oil. Meal planning   At meals, imagine dividing your plate into fourths: ? Fill one-half of your plate with vegetables and green salads. ? Fill one-fourth of your plate with whole grains. ? Fill one-fourth of your plate with lean protein foods.  Eat fish that is high in omega-3 fats at least two times a week.  Eat more foods that contain fiber, such as whole grains, beans, apples, broccoli, carrots, peas, and barley. These foods help promote healthy cholesterol levels in the blood. Recommended foods Grains  Whole grains, such as whole wheat or whole grain breads, crackers, cereals, and pasta. Unsweetened oatmeal, bulgur, barley, quinoa, or brown rice. Corn or whole wheat flour tortillas. Vegetables  Fresh or frozen vegetables (raw, steamed, roasted, or grilled). Green salads. Fruits  All fresh, canned (in natural juice), or frozen fruits. Meats and other protein foods  Ground beef (85% or leaner), grass-fed beef, or beef trimmed of fat. Skinless chicken or [place]. Ground chicken or [place]. Pork trimmed of fat. All fish and seafood. Egg whites. Dried  beans, peas, or lentils. Unsalted nuts or seeds. Unsalted canned beans. Natural nut butters without added sugar and oil. Dairy  Low-fat or nonfat dairy products, such as skim or 1% milk, 2% or reduced-fat cheeses, low-fat and fat-free ricotta or cottage cheese, or plain low-fat and nonfat yogurt. Fats and oils  Tub margarine without  trans fats. Light or reduced-fat mayonnaise and salad dressings. Avocado. Olive, canola, sesame, or safflower oils. The items listed above may not be a complete list of recommended foods or beverages. Contact your dietitian for more options. Foods to avoid Grains  White bread. White pasta. White rice. Cornbread. Bagels, pastries, and croissants. Crackers and snack foods that contain trans fat and hydrogenated oils. Vegetables  Vegetables cooked in cheese, cream, or butter sauce. Fried vegetables. Fruits  Canned fruit in heavy syrup. Fruit in cream or butter sauce. Fried fruit. Meats and other protein foods  Fatty cuts of meat. Ribs, chicken wings, bacon, sausage, bologna, salami, chitterlings, fatback, hot dogs, bratwurst, and packaged lunch meats. Liver and organ meats. Whole eggs and egg yolks. Chicken and [place] with skin. Fried meat. Dairy  Whole or 2% milk, cream, half-and-half, and cream cheese. Whole milk cheeses. Whole-fat or sweetened yogurt. Full-fat cheeses. Nondairy creamers and whipped toppings. Processed cheese, cheese spreads, and cheese curds. Beverages  Alcohol. Sugar-sweetened drinks such as sodas, lemonade, and fruit drinks. Fats and oils  Butter, stick margarine, lard, shortening, ghee, or bacon fat. Coconut, palm kernel, and palm oils. Sweets and desserts  Corn syrup, sugars, honey, and molasses. Candy. Jam and jelly. Syrup. Sweetened cereals. Cookies, pies, cakes, donuts, muffins, and ice cream. The items listed above may not be a complete list of foods and beverages to avoid. Contact your dietitian for more information. Summary  Your body needs fat and cholesterol for basic functions. However, eating too much of these things can be harmful to your health.  Work with your health care provider and dietitian to follow a diet low in fat and cholesterol. Doing this may help lower your risk for heart disease and other conditions.  Choose healthy fats, such as  monounsaturated and polyunsaturated fats, and foods high in omega-3 fatty acids.  Eat fiber-rich foods, such as whole grains, beans, peas, fruits, and vegetables.  Limit or avoid alcohol, fried foods, and foods high in saturated fats, partially hydrogenated oils, and sugar. This information is not intended to replace advice given to you by your health care provider. Make sure you discuss any questions you have with your health care provider. Document Revised: 06/19/2017 Document Reviewed: 03/24/2017 Elsevier Patient Education  [place].

## 2020-05-21 NOTE — Progress Notes (Signed)
Location:  [facility] clinic Provider:  Carlin Attridge L. Mariea Clonts, D.O., C.M.D.  Goals of Care:  Advanced Directives 05/21/2020  Does Patient Have a Medical Advance Directive? Yes  Type of Advance Directive Living will  Does patient want to make changes to medical advance directive? No - Patient declined  Copy of [place] in Chart? -   Chief Complaint  Patient presents with  . Medical Management of Chronic Issues    5 month follow up   . Health Maintenance    Tetanus  . Acute Visit    chronic pain due to MS and Sjogren's, patient is requesting the a diagnosis be taken off of her list (disorder w /mixed anxiety and depression) Dr. Arlice Colt... She stated this was over a eight thousand dollar medication for MS.     HPI: Patient is a 74 y.o. female seen today for medical management of chronic diseases.    Diagnosed officially with osteoarthritis that made her thumb stuck and Sjogren's.  Wearing a splint at night for the thumb with benefit.  She had a different back thing in June that did clear up.  She has her consistent chronic pain in her back.  Is going to PT.  Also sees Dr. Ernestina Patches who did a couple of epidurals that haven't helped that much.  She may have another epidural and she has not decided about an ablation.  She already has so much right-sided weakness.    She was having some bloody discharge and [place] was done at gyn.  Tissue biopsied was fine.  Did have a polyp on the [place] so has f/u on it.  She did stop her estrogen and progesterone now.  Has had some hot flashes.  She's not been able to sleep after a couple of days off them.    MS:  Changing season causes a lot more pain.    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  They had a very small senior program again.    She's been tracking her BP and it's up 130s to 170s. No pattern.   Knows she is "very overweight".  Eats a mediterranean diet.  Does not do a lot of salt.  She is swimming weekly and doing exercises 5 days a week.  Does  PT once a week.  Has not been to tai chi the last month due to pain.  But she will return.    Past Medical History:  Diagnosis Date  . Allergy   . Essential hypertension 08/25/2018  . Hot flashes, menopausal 10/13/2011   Estradiol started   . Memory loss 09/21/2019  . Multiple sclerosis ([place])   . Neuropathy   . Osteoarthritis   . Vision abnormalities     Past Surgical History:  Procedure Laterality Date  . ABDOMINAL HYSTERECTOMY  2002   partial  . LUMBAR FUSION      No Known Allergies  Outpatient Encounter Medications as of 05/21/2020  Medication Sig  . acetaminophen (TYLENOL) 500 MG tablet Take 2 tablets (1,000 mg total) by mouth in the morning and at bedtime.  [name] Kitchen aspirin EC 81 MG tablet Take 81 mg by mouth daily.  . calcium carbonate (OS-CAL) 600 MG TABS Take 600 mg by mouth daily.  . clonazePAM (KLONOPIN) 0.5 MG tablet Take 1 tablet by mouth at bedtime  . estradiol (CLIMARA - DOSED IN MG/24 HR) 0.025 mg/24hr patch Place 0.025 mg onto the skin once a week.  . fexofenadine (ALLEGRA) 180 MG tablet Take 180 mg  by mouth daily.  [name] Kitchen leflunomide (ARAVA) 20 MG tablet Take 1 tablet (20 mg total) by mouth daily.  [name] Kitchen losartan (COZAAR) 25 MG tablet TAKE 1 TABLET BY MOUTH EVERY DAY  . Misc Natural Products (SUPER-D3+) 5000 units CAPS Take by mouth daily.  . modafinil (PROVIGIL) 200 MG tablet TAKE 1 TABLET EVERY MORNING AND ONE-HALF TABLET EVERY AFTERNOON  . omeprazole (PRILOSEC) 40 MG capsule TAKE 1 CAPSULE(40 MG) BY MOUTH DAILY  . Polyethylene Glycol 3350 (MIRALAX PO) Take by mouth daily as needed.   . progesterone (PROMETRIUM) 100 MG capsule TAKE 1 CAPSULE BY MOUTH EVERY DAY AT BEDTIME  . solifenacin (VESICARE) 5 MG tablet TAKE 1 TABLET BY MOUTH EVERY DAY  . traMADol (ULTRAM) 50 MG tablet Take 1 tablet (50 mg total) by mouth at bedtime.   No facility-administered encounter medications on file as of 05/21/2020.    Review of Systems:  Review of Systems  Constitutional: Positive for  malaise/fatigue. Negative for chills and fever.  HENT: Negative for congestion and sore throat.   Eyes: Negative for blurred vision.  Respiratory: Negative for cough and shortness of breath.   Cardiovascular: Negative for chest pain, palpitations and leg swelling.  Gastrointestinal: Negative for abdominal pain.  Genitourinary: Negative for dysuria.  Musculoskeletal: Negative for joint pain.  Neurological: Positive for focal weakness. Negative for dizziness and loss of consciousness.  Psychiatric/Behavioral: Negative for depression and memory loss. The patient is not nervous/anxious.     Health Maintenance  Topic Date Due  . TETANUS/TDAP  12/05/2019  . DEXA SCAN  10/16/2020 (Originally 12/20/2012)  . Hepatitis C Screening  10/16/2020 (Originally 07/31/1945)  . Fecal DNA (Cologuard)  09/28/2021  . MAMMOGRAM  03/14/2022  . INFLUENZA VACCINE  Completed  . COVID-19 Vaccine  Completed  . PNA vac Low Risk Adult  Completed    Physical Exam: Vitals:   05/21/20 1413  BP: 138/72  Pulse: 72  Temp: 97.7 F (36.5 C)  TempSrc: Temporal  SpO2: 98%  Weight: 178 lb (80.7 kg)  Height: _0  (1.575 m)   Body mass index is 32.56 kg/m. Physical Exam Vitals reviewed.  Constitutional:      General: She is not in acute distress.    Appearance: Normal appearance. She is obese. She is not ill-appearing or toxic-appearing.  Cardiovascular:     Rate and Rhythm: Normal rate and regular rhythm.     Pulses: Normal pulses.     Heart sounds: Normal heart sounds.  Pulmonary:     Effort: Pulmonary effort is normal.     Breath sounds: Normal breath sounds. No wheezing, rhonchi or rales.  Abdominal:     General: Bowel sounds are normal.     Palpations: Abdomen is soft.  Musculoskeletal:        General: Normal range of motion.     Right lower leg: No edema.     Left lower leg: No edema.  Skin:    General: Skin is warm and dry.  Neurological:     General: No focal deficit present.     Mental Status:  She is alert and oriented to person, place, and time.  Psychiatric:        Mood and Affect: Mood normal.        Behavior: Behavior normal.        Thought Content: Thought content normal.        Judgment: Judgment normal.     Labs reviewed: Basic Metabolic Panel: Recent Labs    09/21/19  1037 04/04/20 1132  NA 140 139  K 4.4 4.5  CL 104 103  CO2 23 22  GLUCOSE 84 83  BUN 15 24  CREATININE 0.78 0.77  CALCIUM 9.4 9.5  TSH 4.260  --    Liver Function Tests: Recent Labs    09/21/19 1037 04/04/20 1132  AST 24 38  ALT 24 65*  ALKPHOS 147* 192*  BILITOT 0.3 0.2  PROT 6.2 6.7  ALBUMIN 4.3 4.5   No results for input(s): LIPASE, AMYLASE in the last 8760 hours. No results for input(s): AMMONIA in the last 8760 hours. CBC: Recent Labs    09/21/19 1037 04/04/20 1132  WBC 6.0 5.0  NEUTROABS 4.5 3.3  HGB 13.3 13.2  HCT 39.8 40.9  MCV 90 92  PLT 204 211   Lipid Panel: Recent Labs    10/17/19 1119  CHOL 242*  HDL 47*  LDLCALC 161*  TRIG 187*  CHOLHDL 5.1*   No results found for: HGBA1C  Procedures since last visit: XR Foot 2 Views Left  Result Date: 04/27/2020 First MTP narrowing was noted.  PIP and DIP narrowing was noted.  No other MTP, intertarsal, tibiotalar or subtalar joint space narrowing was noted.  No erosive changes was noted. Impression: These findings are consistent with osteoarthritis of the foot.  XR Foot 2 Views Right  Result Date: 04/27/2020 Severe first MTP narrowing was noted.  PIP and DIP narrowing was noted.  No MTP, intertarsal, tibiotalar or subtalar joint space narrowing was noted.  No erosive changes was noted. Impression: These findings are consistent with osteoarthritis of the foot.  XR Hand 2 View Left  Result Date: 04/27/2020 Severe CMC narrowing and subluxation was noted.  PIP and DIP narrowing was noted.  No MCP, intercarpal or radiocarpal joint space narrowing was noted.  No erosive changes were noted. Impression: These findings  are consistent with osteoarthritis of the hand.  XR Hand 2 View Right  Result Date: 04/27/2020 Severe CMC narrowing was noted.  PIP and DIP narrowing was noted.  No MCP, intercarpal or radiocarpal joint space narrowing was noted.  No erosive changes were noted. Impression: These findings are consistent with osteoarthritis of the hand.   Assessment/Plan 1. Essential hypertension -bps running up lately in 150s-170s at times which is too high -will take two 66m losartan for a couple of weeks and let me know if bp still too high - losartan (COZAAR) 50 MG tablet; Take 1 tablet (50 mg total) by mouth daily.  Dispense: 30 tablet; Refill: 3 - CBC with Differential/Platelet; Future - COMPLETE METABOLIC PANEL WITH GFR; Future  2. MULTIPLE SCLEROSIS, PROGRESSIVE/RELAPSING -pain worse but seems more related to back than MS  3. Low back pain radiating to left lower extremity -f/u with Dr. NRutherford Limerickepidurals, cont PT  4. Pure hypercholesterolemia - cont to work on diet, keep swimming when able - Lipid panel; Future  5. Chronic fatigue - due to MS, but also found to have sjogren's so I think it's wise to check tsh annually with her multiple autoimmune diseases - TSH; Future  6. Uterine polyp - f/u with ob/gyn as planned  7. Sjogren's syndrome with keratoconjunctivitis [place] -new dx by rheum -had cavity recently -encouraged biotene use  8. Hot flashes, menopausal - has held her hormones since she had the postmenopausal bleeding but now hot flashes are waking her and hit's hard to go back to sleep -we did review risk/benefit some -she will f/u with gyn  Labs/tests ordered:  Lab Orders     CBC with Differential/Platelet     COMPLETE METABOLIC PANEL WITH GFR     Lipid panel     TSH  Next appt:  09/06/2020   Dyke Weible L. Zymarion Favorite, D.O. [facility] [address], [place] [address] Cell Phone (Mon-Fri 8am-5pm):   [phone] On Call:  [phone] & follow prompts after 5pm & weekends Office Phone:  [phone] Office Fax:  [phone]

## 2020-05-22 ENCOUNTER — Encounter: Payer: Self-pay | Admitting: Physical Therapy

## 2020-05-22 NOTE — Therapy (Signed)
[place], [place], [address] Phone: [phone]   Fax:  [phone]  Physical Therapy Treatment  Patient Details  Name: Dawn [name] MRN: [id number] Date of Birth: November 12, 1945 Referring Provider (PT): Dr Christell Constant   Encounter Date: 05/21/2020   PT End of Session - 05/22/20 1209    Visit Number 46    Number of Visits 53    Date for PT Re-Evaluation 07/09/20    Authorization Type progress note done on 63 th visit Next at 71    PT Start Time 1015    PT Stop Time 1058    PT Time Calculation (min) 43 min    Equipment Utilized During Treatment Gait belt    Activity Tolerance Patient tolerated treatment well    Behavior During Therapy [facility] for tasks assessed/performed           Past Medical History:  Diagnosis Date  . Allergy   . Essential hypertension 08/25/2018  . Hot flashes, menopausal 10/13/2011   Estradiol started   . Memory loss 09/21/2019  . Multiple sclerosis ([place])   . Neuropathy   . Osteoarthritis   . Vision abnormalities     Past Surgical History:  Procedure Laterality Date  . ABDOMINAL HYSTERECTOMY  2002   partial  . LUMBAR FUSION      There were no vitals filed for this visit.   Subjective Assessment - 05/22/20 1208    How long can you stand comfortably? hurts as soon as she stands. Has to shift frequently    How long can you walk comfortably? limited community ambualtion    Diagnostic tests Nothing recent    Currently in Pain? Yes    Pain Score 4     Pain Location Back    Pain Orientation Left;Right    Pain Descriptors / Indicators Aching    Pain Onset More than a month ago    Pain Frequency Constant    Aggravating Factors  standing and walking    Pain Relieving Factors rest and stretching    Effect of Pain on Daily Activities difficulty walking                             [facility] Adult PT Treatment/Exercise - 05/22/20 0001      Lumbar Exercises:  Stretches   Lower Trunk Rotation Limitations x20    Piriformis Stretch Limitations 2x20 sec holdleft only today      Lumbar Exercises: Seated   Other Seated Lumbar Exercises ball roll forward x10 5 sec hold; lateral ball roll x5 bilateral; ball press 2x10; laq 2x10; clamshell x20 red       Manual Therapy   Manual Therapy Soft tissue mobilization   Lumbar paraspinals L1-L5   Manual therapy comments skilled palpation of trigger points     Joint Mobilization Long axis distraction   R grade I&II, L grade II&III   Soft tissue mobilization softt tissue mobilization to bilateral lumbar spine and bilateral gluteals                  PT Education - 05/22/20 1209    Education Details HEP and symptom mangement    Person(s) Educated Patient    Methods Explanation;Demonstration;Tactile cues;Verbal cues    Comprehension Verbalized understanding;Returned demonstration;Verbal cues required;Tactile cues required            PT Short Term Goals - 03/28/20 1446  PT SHORT TERM GOAL #1   Title Patient will report no pain radiating into anterior thigh    Baseline no    Time 4    Period Weeks    Status Achieved    Target Date 12/09/19      PT SHORT TERM GOAL #2   Title Patient will increase bilateral LE strength to 4/5    Baseline 4+/5 gross    Time 4    Period Weeks    Status Achieved      PT SHORT TERM GOAL #3   Title Patient will increase passive right hip flexion to 105 degrrees    Baseline 100 today with pain onthe right side    Time 4    Period Weeks    Status On-going      PT SHORT TERM GOAL #4   Title Patient will increase right cervical rotation by 10 degrees    Baseline to 58 degres today    Time 4    Period Weeks    Status On-going    Target Date 01/09/20      PT SHORT TERM GOAL #5   Title Patient will increase ross bilateral UE strength to 4+/5    Baseline improved to 4/5    Time 4    Period Weeks    Status On-going             PT Long Term Goals -  04/30/20 1430      PT LONG TERM GOAL #1   Title Patient will stand for 30 min without self reported increase pain in order to stand at a kitchen sink.    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     Baseline pain at edn range hip flexion and ir but intensity decreased    Time 6    Period Weeks    Status On-going      PT LONG TERM GOAL #4   Title Patient will reach behind his head without pain in order to perfrom ADL's    Baseline mild pain and tightness but improved    Time 6    Period Weeks    Status On-going      PT LONG TERM GOAL #5   Title Patient will report no radicular pain into her hands    Baseline more intermittent    Time 6    Period Weeks    Status On-going      PT LONG TERM GOAL #6   Title POatient will improve ABC score to 50% function in order to decrease her risk of fall    Baseline 40%    Period Weeks    Status On-going                 Plan - 05/22/20 1209    Clinical Impression Statement Patients back spasming cointinues to improve. She tolerated ball exercises well today. She was also able  to complet mat exercises. We will begin to progress her nmore twoards her functional strengthening exercises over the next few visits    Examination-Activity Limitations Locomotion Level;Carry;Stand;Lift;Stairs;Squat    Examination-Participation Restrictions Meal Prep;Cleaning;Community Activity;Laundry;Shop    Stability/Clinical Decision Making Stable/Uncomplicated    Clinical Decision Making Low    Rehab Potential Poor    PT Frequency 1x / week    PT Duration 8 weeks      PT Treatment/Interventions ADLs/Self Care Home Management;Electrical Stimulation;Cryotherapy;Iontophoresis 4mg /ml Dexamethasone;Moist Heat;Traction;DME Instruction;Neuromuscular re-education;Patient/family education;Manual techniques;Passive range of motion;Taping    PT Next Visit Plan work on balance with patient,    PT Home Exercise Plan LTR; hamstring stretch, tennis ball trigger point release ; abdominal breathing;Access Code: [email or web address].Seated Upper Trapezius Stretch - 1 x daily - 7 x weekly - 1 sets - 3 reps - 20 hold.Gentle Levator Scapulae Stretch - 1 x daily - 7 x weekly - 1 sets - 3 reps - 20 hold.Shoulder extension with resistance - Neutral - 1 x daily - 7 x weekly - 3 sets - 10 reps.Scapular Retraction with Resistance - 1 x daily - 7 x weekly - 3 sets - 10 reps    Consulted and Agree with Plan of Care Patient           Patient will benefit from skilled therapeutic intervention in order to improve the following deficits and impairments:  Abnormal gait, Difficulty walking, Decreased range of motion, Decreased mobility, Decreased strength, Postural dysfunction, Pain, Increased muscle spasms  Visit Diagnosis: Chronic bilateral low back pain without sciatica  Muscle weakness (generalized)  Difficulty in walking, not elsewhere classified  Cervicalgia  Cramp and spasm     Problem List Patient Active Problem List   Diagnosis Date Noted  . Sacroiliitis, not elsewhere classified ([place]) 12/22/2019  . Other secondary scoliosis, lumbar region 12/22/2019  . Arthritis of facet joint of lumbar spine 12/22/2019  . Cervical radiculopathy 10/18/2019  . Numbness 09/21/2019  . Bilateral carpal tunnel syndrome 09/21/2019  . High risk medication use 09/21/2019  . Memory loss 09/21/2019  . Essential hypertension 08/25/2018  . Abnormal SPEP 02/19/2018  . Hand pain 02/20/2017  . Multiple joint pain 02/18/2017  . Right elbow pain 12/09/2016  . Disturbed cognition 06/25/2016  .  Trochanteric bursitis of left hip 02/18/2016  . Sciatica, right side 10/30/2015  . Bilateral arm pain 10/10/2015  . Trochanteric bursitis of right hip 08/04/2014  . Chronic fatigue 08/04/2014  . Urinary frequency 08/04/2014  . Abnormality of gait 08/04/2014  . Unspecified visual disturbance 01/31/2013  . Transient vision disturbance 01/31/2013  . Routine general medical examination at a health care facility 11/04/2011  . Colon polyps 11/03/2011  . Hot flashes, menopausal 10/13/2011  . POSTHERPETIC NEURALGIA 10/03/2009  . DISPLCMT LUMBAR INTERVERT [place] W/O MYELOPATHY 09/05/2009  . RENAL CALCULUS, RECURRENT 09/04/2009  . Hyperlipidemia 08/31/2009  . MULTIPLE SCLEROSIS, PROGRESSIVE/RELAPSING 08/31/2009  . ALLERGIC RHINITIS 08/31/2009    Carney Living PT DPT  05/22/2020, 12:18 PM  [facility] [address] [place], [place], [address] Phone: [phone]   Fax:  [phone]  Name: Dawn [name] MRN: [id number] Date of Birth: 04/26/1946

## 2020-05-23 ENCOUNTER — Other Ambulatory Visit: Payer: Self-pay

## 2020-05-23 ENCOUNTER — Encounter: Payer: Self-pay | Admitting: Rheumatology

## 2020-05-23 ENCOUNTER — Telehealth: Payer: Self-pay | Admitting: Physical Medicine and Rehabilitation

## 2020-05-23 ENCOUNTER — Ambulatory Visit (INDEPENDENT_AMBULATORY_CARE_PROVIDER_SITE_OTHER): Payer: Medicare Other | Admitting: Rheumatology

## 2020-05-23 VITALS — BP 146/86 | HR 61 | Resp 16 | Ht 62.0 in | Wt 180.8 lb

## 2020-05-23 DIAGNOSIS — M47816 Spondylosis without myelopathy or radiculopathy, lumbar region: Secondary | ICD-10-CM | POA: Diagnosis not present

## 2020-05-23 DIAGNOSIS — M19071 Primary osteoarthritis, right ankle and foot: Secondary | ICD-10-CM | POA: Insufficient documentation

## 2020-05-23 DIAGNOSIS — M4156 Other secondary scoliosis, lumbar region: Secondary | ICD-10-CM

## 2020-05-23 DIAGNOSIS — M19041 Primary osteoarthritis, right hand: Secondary | ICD-10-CM

## 2020-05-23 DIAGNOSIS — M19042 Primary osteoarthritis, left hand: Secondary | ICD-10-CM | POA: Diagnosis not present

## 2020-05-23 DIAGNOSIS — M3509 Sicca syndrome with other organ involvement: Secondary | ICD-10-CM | POA: Diagnosis not present

## 2020-05-23 DIAGNOSIS — G35 Multiple sclerosis: Secondary | ICD-10-CM

## 2020-05-23 DIAGNOSIS — M3500 Sicca syndrome, unspecified: Secondary | ICD-10-CM | POA: Insufficient documentation

## 2020-05-23 DIAGNOSIS — I10 Essential (primary) hypertension: Secondary | ICD-10-CM

## 2020-05-23 DIAGNOSIS — M5136 Other intervertebral disc degeneration, lumbar region: Secondary | ICD-10-CM

## 2020-05-23 DIAGNOSIS — Z8261 Family history of arthritis: Secondary | ICD-10-CM | POA: Diagnosis not present

## 2020-05-23 DIAGNOSIS — N950 Postmenopausal bleeding: Secondary | ICD-10-CM | POA: Diagnosis not present

## 2020-05-23 DIAGNOSIS — M5030 Other cervical disc degeneration, unspecified cervical region: Secondary | ICD-10-CM

## 2020-05-23 DIAGNOSIS — M7062 Trochanteric bursitis, left hip: Secondary | ICD-10-CM

## 2020-05-23 DIAGNOSIS — M7061 Trochanteric bursitis, right hip: Secondary | ICD-10-CM | POA: Diagnosis not present

## 2020-05-23 DIAGNOSIS — M19072 Primary osteoarthritis, left ankle and foot: Secondary | ICD-10-CM

## 2020-05-23 NOTE — Patient Instructions (Signed)
Hand Exercises Hand exercises can be helpful for almost anyone. These exercises can strengthen the hands, improve flexibility and movement, and increase blood flow to the hands. These results can make work and daily tasks easier. Hand exercises can be especially helpful for people who have joint pain from arthritis or have nerve damage from overuse (carpal tunnel syndrome). These exercises can also help people who have injured a hand. Exercises Most of these hand exercises are gentle stretching and motion exercises. It is usually safe to do them often throughout the day. Warming up your hands before exercise may help to reduce stiffness. You can do this with gentle massage or by placing your hands in warm water for 10-15 minutes. It is normal to feel some stretching, pulling, tightness, or mild discomfort as you begin new exercises. This will gradually improve. Stop an exercise right away if you feel sudden, severe pain or your pain gets worse. Ask your health care provider which exercises are best for you. Knuckle bend or "claw" fist 1. Stand or sit with your arm, hand, and all five fingers pointed straight up. Make sure to keep your wrist straight during the exercise. 2. Gently bend your fingers down toward your palm until the tips of your fingers are touching the top of your palm. Keep your big knuckle straight and just bend the small knuckles in your fingers. 3. Hold this position for __________ seconds. 4. Straighten (extend) your fingers back to the starting position. Repeat this exercise 5-10 times with each hand. Full finger fist 1. Stand or sit with your arm, hand, and all five fingers pointed straight up. Make sure to keep your wrist straight during the exercise. 2. Gently bend your fingers into your palm until the tips of your fingers are touching the middle of your palm. 3. Hold this position for __________ seconds. 4. Extend your fingers back to the starting position, stretching every  joint fully. Repeat this exercise 5-10 times with each hand. Straight fist 1. Stand or sit with your arm, hand, and all five fingers pointed straight up. Make sure to keep your wrist straight during the exercise. 2. Gently bend your fingers at the big knuckle, where your fingers meet your hand, and the middle knuckle. Keep the knuckle at the tips of your fingers straight and try to touch the bottom of your palm. 3. Hold this position for __________ seconds. 4. Extend your fingers back to the starting position, stretching every joint fully. Repeat this exercise 5-10 times with each hand. Tabletop 1. Stand or sit with your arm, hand, and all five fingers pointed straight up. Make sure to keep your wrist straight during the exercise. 2. Gently bend your fingers at the big knuckle, where your fingers meet your hand, as far down as you can while keeping the small knuckles in your fingers straight. Think of forming a tabletop with your fingers. 3. Hold this position for __________ seconds. 4. Extend your fingers back to the starting position, stretching every joint fully. Repeat this exercise 5-10 times with each hand. Finger spread 1. Place your hand flat on a table with your palm facing down. Make sure your wrist stays straight as you do this exercise. 2. Spread your fingers and thumb apart from each other as far as you can until you feel a gentle stretch. Hold this position for __________ seconds. 3. Bring your fingers and thumb tight together again. Hold this position for __________ seconds. Repeat this exercise 5-10 times with each hand.   Making circles 1. Stand or sit with your arm, hand, and all five fingers pointed straight up. Make sure to keep your wrist straight during the exercise. 2. Make a circle by touching the tip of your thumb to the tip of your index finger. 3. Hold for __________ seconds. Then open your hand wide. 4. Repeat this motion with your thumb and each finger on your  hand. Repeat this exercise 5-10 times with each hand. Thumb motion 1. Sit with your forearm resting on a table and your wrist straight. Your thumb should be facing up toward the ceiling. Keep your fingers relaxed as you move your thumb. 2. Lift your thumb up as high as you can toward the ceiling. Hold for __________ seconds. 3. Bend your thumb across your palm as far as you can, reaching the tip of your thumb for the small finger (pinkie) side of your palm. Hold for __________ seconds. Repeat this exercise 5-10 times with each hand. Grip strengthening  1. Hold a stress ball or other soft ball in the middle of your hand. 2. Slowly increase the pressure, squeezing the ball as much as you can without causing pain. Think of bringing the tips of your fingers into the middle of your palm. All of your finger joints should bend when doing this exercise. 3. Hold your squeeze for __________ seconds, then relax. Repeat this exercise 5-10 times with each hand. Contact a health care provider if:  Your hand pain or discomfort gets much worse when you do an exercise.  Your hand pain or discomfort does not improve within 2 hours after you exercise. If you have any of these problems, stop doing these exercises right away. Do not do them again unless your health care provider says that you can. Get help right away if:  You develop sudden, severe hand pain or swelling. If this happens, stop doing these exercises right away. Do not do them again unless your health care provider says that you can. This information is not intended to replace advice given to you by your health care provider. Make sure you discuss any questions you have with your health care provider. Document Revised: 10/28/2018 Document Reviewed: 07/08/2018 Elsevier Patient Education  2020 [employer]  

## 2020-05-23 NOTE — Telephone Encounter (Signed)
Patient called requesting a called back to set an appt for a back injection. Please call patient at [phone].

## 2020-05-23 NOTE — Progress Notes (Signed)
Office Visit Note  Patient: Dawn [name]             Date of Birth: 09/11/1945           MRN: [id number]             PCP: Gayland Curry, DO Referring: Gayland Curry, DO Visit Date: 05/23/2020 Occupation: '@GUAROCC' @  Subjective:  Dry mouth, dry eyes and joint pain   History of Present Illness: Dawn [name] is a 74 y.o. female with history of sicca symptoms and osteoarthritis.  She continues to have dry mouth and dry eyes.  She complains of pain and discomfort in multiple joints.  She also has discomfort from multiple sclerosis per patient.  She states that Tylenol is not sufficient for her.  Activities of Daily Living:  Patient reports morning stiffness for all day. Patient Reports nocturnal pain.  Difficulty dressing/grooming: Reports Difficulty climbing stairs: Reports Difficulty getting out of chair: Reports Difficulty using hands for taps, buttons, cutlery, and/or writing: Reports  Review of Systems  Constitutional: Positive for fatigue.  HENT: Positive for mouth dryness and nose dryness. Negative for mouth sores.   Eyes: Positive for dryness. Negative for pain and itching.  Respiratory: Negative for shortness of breath and difficulty breathing.   Cardiovascular: Negative for chest pain and palpitations.  Gastrointestinal: Negative for blood in stool, constipation and diarrhea.  Endocrine: Negative for increased urination.  Genitourinary: Negative for difficulty urinating.  Musculoskeletal: Positive for arthralgias, joint pain, myalgias, morning stiffness, muscle tenderness and myalgias. Negative for joint swelling.  Skin: Negative for color change, rash and redness.  Allergic/Immunologic: Negative for susceptible to infections.  Neurological: Positive for numbness, memory loss and weakness. Negative for dizziness and headaches.  Hematological: Negative for bruising/bleeding tendency.  Psychiatric/Behavioral: Positive for sleep disturbance. Negative for  confusion.    PMFS History:  Patient Active Problem List   Diagnosis Date Noted  . Sjogren's disease ([place]) 05/23/2020  . Primary osteoarthritis of both hands 05/23/2020  . Primary osteoarthritis of both feet 05/23/2020  . Sacroiliitis, not elsewhere classified ([place]) 12/22/2019  . Other secondary scoliosis, lumbar region 12/22/2019  . Arthritis of facet joint of lumbar spine 12/22/2019  . Cervical radiculopathy 10/18/2019  . Numbness 09/21/2019  . Bilateral carpal tunnel syndrome 09/21/2019  . High risk medication use 09/21/2019  . Memory loss 09/21/2019  . Essential hypertension 08/25/2018  . Abnormal SPEP 02/19/2018  . Hand pain 02/20/2017  . Multiple joint pain 02/18/2017  . Right elbow pain 12/09/2016  . Disturbed cognition 06/25/2016  . Trochanteric bursitis of left hip 02/18/2016  . Sciatica, right side 10/30/2015  . Bilateral arm pain 10/10/2015  . Trochanteric bursitis of right hip 08/04/2014  . Chronic fatigue 08/04/2014  . Urinary frequency 08/04/2014  . Abnormality of gait 08/04/2014  . Unspecified visual disturbance 01/31/2013  . Transient vision disturbance 01/31/2013  . Routine general medical examination at a health care facility 11/04/2011  . Colon polyps 11/03/2011  . Hot flashes, menopausal 10/13/2011  . POSTHERPETIC NEURALGIA 10/03/2009  . DISPLCMT LUMBAR INTERVERT [place] W/O MYELOPATHY 09/05/2009  . RENAL CALCULUS, RECURRENT 09/04/2009  . Hyperlipidemia 08/31/2009  . MULTIPLE SCLEROSIS, PROGRESSIVE/RELAPSING 08/31/2009  . ALLERGIC RHINITIS 08/31/2009    Past Medical History:  Diagnosis Date  . Allergy   . Essential hypertension 08/25/2018  . Hot flashes, menopausal 10/13/2011   Estradiol started   . Memory loss 09/21/2019  . Multiple sclerosis ([place])   . Neuropathy   . Osteoarthritis   .  Vision abnormalities     Family History  Problem Relation Age of Onset  . Heart failure Mother   . Heart failure Father   . Arthritis Other   . Hypertension Other     Past Surgical History:  Procedure Laterality Date  . ABDOMINAL HYSTERECTOMY  2002   partial  . 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     Tobacco use, amount per day now: former   Past tobacco use, amount per day: 1/2-1 packet   How many years did you use tobacco: 12 years   Alcohol use (drinks per week): 0   Diet: mediterranean based   Do you drink/eat things with caffeine: yes   Marital status:        widow                          What year were you married? 1987   Do you live in a house, apartment, assisted living, condo, trailer, etc.? house   Is it one or more stories? 1 story   How many persons live in your home? 1   Do you have pets in your home?( please list) no   Current or past profession: Science writer    Do you exercise?          yes                        Type and how often? Tai chi and pt therapy, stretches etc....   Do you have a living will? yes   Do you have a DNR form?      no                             If not, do you want to discuss one? yes   Do you have signed POA/HPOA forms?      no                  If so, please bring to you appointment   Immunization History  Administered Date(s) Administered  . Fluad Quad(high Dose 65+) 04/01/2019  . Influenza Whole 05/14/2011  . Influenza, High Dose Seasonal PF 04/30/2017, 05/15/2018, 05/18/2020  . PFIZER SARS-COV-2 Vaccination 08/27/2019, 09/21/2019, 03/07/2020  . Pneumococcal Conjugate-13 08/30/2018  . Pneumococcal Polysaccharide-23 03/21/2009, 04/20/2020  . Td 12/04/2009  . Zoster 11/29/2009     Objective: Vital Signs: BP (!) 146/86 (BP Location: Left Arm, Patient Position: Sitting, Cuff Size: Normal)   Pulse 61   Resp 16   Ht '5\' 2"'   (1.575 m)   Wt 180 lb 12.8 oz (82 kg)   BMI 33.07 kg/m    Physical Exam Vitals and nursing note reviewed.  Constitutional:      Appearance: She is well-developed.  HENT:     Head: Normocephalic and atraumatic.  Eyes:     Conjunctiva/sclera: Conjunctivae normal.  Cardiovascular:     Rate and Rhythm: Normal rate and regular rhythm.     Heart sounds: Normal heart sounds.  Pulmonary:     Effort: Pulmonary  effort is normal.     Breath sounds: Normal breath sounds.  Abdominal:     General: Bowel sounds are normal.     Palpations: Abdomen is soft.  Musculoskeletal:     Cervical back: Normal range of motion.  Lymphadenopathy:     Cervical: No cervical adenopathy.  Skin:    General: Skin is warm and dry.     Capillary Refill: Capillary refill takes less than 2 seconds.  Neurological:     Mental Status: She is alert and oriented to person, place, and time.  Psychiatric:        Behavior: Behavior normal.      Musculoskeletal Exam: C-spine was in good range of motion.  Shoulder joints, elbow joints, wrist joints with good range of motion.  She has bilateral PIP and DIP thickening.  She has bilateral CMC prominence.  She has limited range of motion of her right hip joint due to labral tear.  Left hip joint was in good range of motion.  Knee joints and ankles with good range of motion with no synovitis.  There was no tenderness over MTPs. CDAI Exam: CDAI Score: -- Patient Global: --; Provider Global: -- Swollen: --; Tender: -- Joint Exam 05/23/2020   No joint exam has been documented for this visit   There is currently no information documented on the homunculus. Go to the Rheumatology activity and complete the homunculus joint exam.  Investigation: No additional findings.  Imaging: XR Foot 2 Views Left  Result Date: 04/27/2020 First MTP narrowing was noted.  PIP and DIP narrowing was noted.  No other MTP, intertarsal, tibiotalar or subtalar joint space narrowing was noted.  No  erosive changes was noted. Impression: These findings are consistent with osteoarthritis of the foot.  XR Foot 2 Views Right  Result Date: 04/27/2020 Severe first MTP narrowing was noted.  PIP and DIP narrowing was noted.  No MTP, intertarsal, tibiotalar or subtalar joint space narrowing was noted.  No erosive changes was noted. Impression: These findings are consistent with osteoarthritis of the foot.  XR Hand 2 View Left  Result Date: 04/27/2020 Severe CMC narrowing and subluxation was noted.  PIP and DIP narrowing was noted.  No MCP, intercarpal or radiocarpal joint space narrowing was noted.  No erosive changes were noted. Impression: These findings are consistent with osteoarthritis of the hand.  XR Hand 2 View Right  Result Date: 04/27/2020 Severe CMC narrowing was noted.  PIP and DIP narrowing was noted.  No MCP, intercarpal or radiocarpal joint space narrowing was noted.  No erosive changes were noted. Impression: These findings are consistent with osteoarthritis of the hand.   Recent Labs: Lab Results  Component Value Date   WBC 5.0 04/04/2020   HGB 13.2 04/04/2020   PLT 211 04/04/2020   NA 139 04/04/2020   K 4.5 04/04/2020   CL 103 04/04/2020   CO2 22 04/04/2020   GLUCOSE 83 04/04/2020   BUN 24 04/04/2020   CREATININE 0.77 04/04/2020   BILITOT 0.2 04/04/2020   ALKPHOS 192 (H) 04/04/2020   AST 38 04/04/2020   ALT 65 (H) 04/04/2020   PROT 6.7 04/04/2020   ALBUMIN 4.5 04/04/2020   CALCIUM 9.5 04/04/2020   GFRAA 89 04/04/2020   QFTBGOLD Negative 08/20/2015   April 27, 2020 ANA 1: 40 speckled, 1: 80 homogeneous, Ro positive (La negative, dsDNA negative, Smith negative, RNP negative), C3-C4 normal, RF negative, anti-CCP negative  Speciality Comments: No specialty comments available.  Procedures:  No procedures  performed Allergies: Patient has no known allergies.   Assessment / Plan:     Visit Diagnoses: Sjogren's syndrome with other organ involvement ([place]) -  Positive ANA, positive Ro, sicca symptoms.  She is on leflunomide for MS. detailed counseling guarding Sjogren's was provided.  Over-the-counter products were discussed.  I also discussed the use of pilocarpine.  Indications side effects contraindications were discussed but she declined.  She is already on immunosuppressive agents.  Complications of Sjogren's were also discussed.  Association of joint or body with arrhythmias was also discussed.  She has had EKG in the past.  Primary osteoarthritis of both hands-joint protection muscle strengthening was discussed.  She has been doing some of the exercises.  Natural anti-inflammatories were discussed.  Primary osteoarthritis of both feet-proper fitting shoes were discussed.  Trochanteric bursitis of both hips-she has been doing IT band exercises.  DDD (degenerative disc disease), cervical-she continues to have some stiffness and discomfort in her cervical spine.  DDD (degenerative disc disease), lumbar-she has been having a lot of lower back pain.  She states that she is  scheduled to have cortisone injections.  Lumbar spine exercises and core strengthening was emphasized.  Arthritis of facet joint of lumbar spine  Other secondary scoliosis, lumbar region  Multiple sclerosis ([place])  Essential hypertension  Family history of rheumatoid arthritis  Orders: No orders of the defined types were placed in this encounter.  No orders of the defined types were placed in this encounter.     Follow-Up Instructions: Return in about 6 months (around 11/20/2020) for Sjogren's, Osteoarthritis.   Bo Merino, MD  Note - This record has been created using Editor, commissioning.  Chart creation errors have been sought, but may not always  have been located. Such creation errors do not reflect on  the standard of medical care.

## 2020-05-23 NOTE — Telephone Encounter (Signed)
Left message #1. ? Scheduling for bilateral L5-S1 RFA.

## 2020-05-24 ENCOUNTER — Telehealth: Payer: Self-pay | Admitting: Neurology

## 2020-05-24 NOTE — Telephone Encounter (Signed)
Called pt back to further discuss. She has had multiple doctor appt recently. Was told her records state that 5-6 yr ago, she was dx with chronic anxiety/depression and wants this removed, states she does not have this. Felt she was treated differently d/t these dx in her records by other physicians. She also wanted med list updated. States she only takes tylenol as needed, tramadol as needed. Verified this is listed correctly on our records.   She also has f/u with AL,NP 10/02/20 but wants to only see MD. R/s for same day but at 3:30pm with Dr. Felecia Shelling.

## 2020-05-24 NOTE — Telephone Encounter (Signed)
Pt called wanting to discuss some paperwork and medication with RN. Please advise.

## 2020-05-24 NOTE — Telephone Encounter (Signed)
Pt not req Auth#. 

## 2020-05-25 ENCOUNTER — Telehealth: Payer: Self-pay

## 2020-05-25 ENCOUNTER — Ambulatory Visit: Payer: Medicare Other | Admitting: Rheumatology

## 2020-05-25 DIAGNOSIS — M19071 Primary osteoarthritis, right ankle and foot: Secondary | ICD-10-CM

## 2020-05-25 DIAGNOSIS — M4156 Other secondary scoliosis, lumbar region: Secondary | ICD-10-CM

## 2020-05-25 DIAGNOSIS — G35 Multiple sclerosis: Secondary | ICD-10-CM

## 2020-05-25 DIAGNOSIS — M5030 Other cervical disc degeneration, unspecified cervical region: Secondary | ICD-10-CM

## 2020-05-25 DIAGNOSIS — I10 Essential (primary) hypertension: Secondary | ICD-10-CM

## 2020-05-25 DIAGNOSIS — Z79899 Other long term (current) drug therapy: Secondary | ICD-10-CM

## 2020-05-25 DIAGNOSIS — M19041 Primary osteoarthritis, right hand: Secondary | ICD-10-CM

## 2020-05-25 DIAGNOSIS — M3509 Sicca syndrome with other organ involvement: Secondary | ICD-10-CM

## 2020-05-25 DIAGNOSIS — Z8261 Family history of arthritis: Secondary | ICD-10-CM

## 2020-05-25 DIAGNOSIS — M7061 Trochanteric bursitis, right hip: Secondary | ICD-10-CM

## 2020-05-25 DIAGNOSIS — M5136 Other intervertebral disc degeneration, lumbar region: Secondary | ICD-10-CM

## 2020-05-25 NOTE — Telephone Encounter (Signed)
Called pt to sch her for RFA due to having relief from the last two Facets. Pt made it know she wanted an epidural inj not an RFA. Pt has no hx of Epidural inj with any doctor. I informed the pt that she need to make an OV appt with her referral doctor or Dr. Ernestina Patches to discuss her wanting a epidural inj. Pt declined and ended the call.

## 2020-05-27 ENCOUNTER — Other Ambulatory Visit: Payer: Self-pay | Admitting: Neurology

## 2020-05-28 ENCOUNTER — Telehealth: Payer: Self-pay | Admitting: Neurology

## 2020-05-28 ENCOUNTER — Other Ambulatory Visit: Payer: Self-pay | Admitting: Obstetrics and Gynecology

## 2020-05-28 MED ORDER — MODAFINIL 200 MG PO TABS
ORAL_TABLET | ORAL | 5 refills | Status: DC
Start: 2020-05-28 — End: 2020-09-06

## 2020-05-28 NOTE — Addendum Note (Signed)
Addended by: Wyvonnia Lora on: 05/28/2020 01:52 PM   Modules accepted: Orders

## 2020-05-28 NOTE — Telephone Encounter (Signed)
Pt. Is requesting a prescription for modafinil (PROVIGIL) 200 MG tablet 30 day supply.  Pharmacy: [facility] [phone]

## 2020-05-28 NOTE — Addendum Note (Signed)
Addended by: Arlice Colt A on: 05/28/2020 05:14 PM   Modules accepted: Orders

## 2020-05-29 ENCOUNTER — Ambulatory Visit: Payer: Medicare Other | Admitting: Physical Therapy

## 2020-05-30 ENCOUNTER — Telehealth: Payer: Self-pay | Admitting: Physical Medicine and Rehabilitation

## 2020-05-30 NOTE — Telephone Encounter (Signed)
Pt would like to set an appt for a back injection  [phone]

## 2020-05-30 NOTE — Telephone Encounter (Signed)
I again called the patient and explained that we can either schedule her for the approved RFA or for an office visit to discuss. She wants an epidural injection. She has not had an epidural injection with Dr. Ernestina Patches. I advised that we would have to start with an OV and offered her our next available appointment on 11/23. She had another appointment at this time, so I offered the next office visit appointment date of 11/30. She wanted something sooner. I did advise that our next available appointments are on 11/23 and 11/30. She again asked why I could not schedule her for an epidural injection and I again explained that she would need an office visit. She is scheduled for 11/30 at 1000.

## 2020-05-31 ENCOUNTER — Encounter: Payer: Self-pay | Admitting: Physical Therapy

## 2020-05-31 ENCOUNTER — Ambulatory Visit: Payer: Medicare Other | Admitting: Physical Therapy

## 2020-05-31 ENCOUNTER — Telehealth: Payer: Self-pay | Admitting: Neurology

## 2020-05-31 ENCOUNTER — Encounter (HOSPITAL_BASED_OUTPATIENT_CLINIC_OR_DEPARTMENT_OTHER): Payer: Self-pay | Admitting: Obstetrics and Gynecology

## 2020-05-31 ENCOUNTER — Other Ambulatory Visit: Payer: Self-pay

## 2020-05-31 DIAGNOSIS — M542 Cervicalgia: Secondary | ICD-10-CM | POA: Diagnosis not present

## 2020-05-31 DIAGNOSIS — M6281 Muscle weakness (generalized): Secondary | ICD-10-CM | POA: Diagnosis not present

## 2020-05-31 DIAGNOSIS — R262 Difficulty in walking, not elsewhere classified: Secondary | ICD-10-CM | POA: Diagnosis not present

## 2020-05-31 DIAGNOSIS — G8929 Other chronic pain: Secondary | ICD-10-CM

## 2020-05-31 DIAGNOSIS — M545 Low back pain, unspecified: Secondary | ICD-10-CM | POA: Diagnosis not present

## 2020-05-31 DIAGNOSIS — R252 Cramp and spasm: Secondary | ICD-10-CM | POA: Diagnosis not present

## 2020-05-31 NOTE — Progress Notes (Signed)
Spoke w/ via phone for pre-op interview---pt Lab needs dos---- I stat  8, ekg              Lab results-----none- COVID test ------06-07-2020 900 am drive up at [place] will be in [place] aware Arrive at -------925 am 06-08-2020 NPO after MN NO solid Food.  Clear liquids from MN until---825 am then npo Medications to take morning of surgery -----omeprazole, arava Diabetic medication -----n/a Patient Special Instructions -----pt to see dr Lanny Cramp 06-01-2020 and ask about staying on arava for surgery and 81 mg aspirin Pre-Op special Istructions ----- Patient verbalized understanding of instructions that were given at this phone interview. Patient denies shortness of breath, chest pain, fever, cough at this phone  interview   lov dr  Estil Daft rheumatology 06-12-2020 epic Neurology lov dr sater 04-04-2020 epic  Pt has ms transfers independently and walks

## 2020-05-31 NOTE — Telephone Encounter (Signed)
Called pt back to further discuss. She states she has already spoken with Orthopaedics who is recommending a procedure that she does not want to do. She has to wait until end of November for re-evaluation before they determine next steps. Explained that Dr. Felecia Shelling is out of the office, no current openings next week in his schedule. Dr. Felecia Shelling will need to review this request first to see if this is something he can offer. We will call her back next week. She verbalized understanding.

## 2020-05-31 NOTE — Telephone Encounter (Signed)
pt has called asking for 2 shots in hips for her bursitis, please call

## 2020-06-01 ENCOUNTER — Encounter: Payer: Self-pay | Admitting: Physical Therapy

## 2020-06-01 ENCOUNTER — Other Ambulatory Visit: Payer: Self-pay | Admitting: Obstetrics and Gynecology

## 2020-06-01 NOTE — Therapy (Signed)
[place], [place], [address] Phone: [phone]   Fax:  [phone]  Physical Therapy Treatment  Patient Details  Name: Dawn [name] MRN: [id number] Date of Birth: [birth year] Referring Provider (PT): Dr Christell Constant   Encounter Date: 05/31/2020   PT End of Session - 06/01/20 1119    Visit Number 47    Number of Visits 53    Date for PT Re-Evaluation 07/09/20    Authorization Type progress note done on 69 th visit Next at 79    PT Start Time 1500    PT Stop Time 1540    PT Time Calculation (min) 40 min    Activity Tolerance Patient tolerated treatment well    Behavior During Therapy [facility] for tasks assessed/performed           Past Medical History:  Diagnosis Date  . Allergy   . Dry eyes   . Endometrial polyp   . Essential hypertension 08/25/2018  . GERD (gastroesophageal reflux disease)   . History of kidney stones   . Hot flashes, menopausal 10/13/2011   Estradiol started   . Jaundice as teenager   no problems since  . Memory loss 09/21/2019     1/2 of feet numb all the time  . Multiple sclerosis ([place]) dx 2001  . Neuropathy   . Osteoarthritis   . Sjogren's syndrome ([place]) dx oct 2021   sore muscvles, dry mouth and eyes  . Vision abnormalities     Past Surgical History:  Procedure Laterality Date  . ABDOMINAL HYSTERECTOMY  2002   partial  . colonscopy  2011  . LUMBAR FUSION  2001  . UPPER GI ENDOSCOPY  yrs ago    There were no vitals filed for this visit.   Subjective Assessment - 05/31/20 1507    Subjective Patient reports her hips have been sore. She has been more sore ovr this week. She contiues to work on swimming and light exercises. Right side is a little worse then the left    How long can you stand comfortably? hurts as soon as she stands. Has to shift frequently    How long can you walk comfortably? limited community ambualtion    Diagnostic tests Nothing recent     Currently in Pain? Yes    Pain Score 7     Pain Location Back    Pain Orientation Right    Pain Descriptors / Indicators Aching    Pain Type Chronic pain    Pain Radiating Towards into the right gluteal    Pain Onset More than a month ago    Pain Frequency Constant    Aggravating Factors  standing and walking    Pain Relieving Factors rest and stretching    Effect of Pain on Daily Activities difficulty walking    Multiple Pain Sites No                             [facility] Adult PT Treatment/Exercise - 06/01/20 0001      Lumbar Exercises: Stretches   Lower Trunk Rotation Limitations x20      Manual Therapy   Manual Therapy Soft tissue mobilization   Lumbar paraspinals L1-L5   Manual therapy comments skilled palpation of trigger points     Joint Mobilization Long axis distraction   R grade I&II, L grade II&III   Soft tissue mobilization soft tissue mobilization to bilateral lumbar  spine and bilateral gluteals            Trigger Point Dry Needling - 06/01/20 0001    Consent Given? Yes    Gluteus Medius Response Twitch response elicited    Lumbar multifidi Response Twitch response elicited;Palpable increased muscle length                PT Education - 05/31/20 1508    Education Details reviewed benefits and risks of TPDN    Person(s) Educated Patient    Methods Demonstration;Tactile cues;Explanation;Verbal cues    Comprehension Verbalized understanding;Returned demonstration;Verbal cues required;Tactile cues required            PT Short Term Goals - 03/28/20 1446      PT SHORT TERM GOAL #1   Title Patient will report no pain radiating into anterior thigh    Baseline no    Time 4    Period Weeks    Status Achieved    Target Date 12/09/19      PT SHORT TERM GOAL #2   Title Patient will increase bilateral LE strength to 4/5    Baseline 4+/5 gross    Time 4    Period Weeks    Status Achieved      PT SHORT TERM GOAL #3   Title Patient will  increase passive right hip flexion to 105 degrrees    Baseline 100 today with pain onthe right side    Time 4    Period Weeks    Status On-going      PT SHORT TERM GOAL #4   Title Patient will increase right cervical rotation by 10 degrees    Baseline to 58 degres today    Time 4    Period Weeks    Status On-going    Target Date 01/09/20      PT SHORT TERM GOAL #5   Title Patient will increase ross bilateral UE strength to 4+/5    Baseline improved to 4/5    Time 4    Period Weeks    Status On-going             PT Long Term Goals - 04/30/20 1430      PT LONG TERM GOAL #1   Title Patient will stand for 30 min without self reported increase pain in order to stand at a kitchen sink.    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     Baseline pain at edn range hip flexion and ir but intensity decreased    Time 6    Period Weeks    Status On-going      PT LONG TERM GOAL #4   Title Patient will reach behind his head without pain in order to perfrom ADL's    Baseline mild pain and tightness but improved    Time 6    Period Weeks    Status On-going  PT LONG TERM GOAL #5   Title Patient will report no radicular pain into her hands    Baseline more intermittent    Time 6    Period Weeks    Status On-going      PT LONG TERM GOAL #6   Title POatient will improve ABC score to 50% function in order to decrease her risk of fall    Baseline 40%    Period Weeks    Status On-going                 Plan - 06/01/20 1121    Clinical Impression Statement Patient was limited  today by pain. She had increased pain in her hips. Therapy needled her rotators and she had a significant decrease in her pain. She ereported improved ability to walk after her traeatment. Therapy focused on manual hip stretching and trigger point release following trreatment. We will continue to progress as tolerated.    Personal Factors and Comorbidities Comorbidity 1;Comorbidity 2    Comorbidities MS, OA    Examination-Activity Limitations Locomotion Level;Carry;Stand;Lift;Stairs;Squat    Examination-Participation Restrictions Meal Prep;Cleaning;Community Activity;Laundry;Shop    Stability/Clinical Decision Making Stable/Uncomplicated    Clinical Decision Making Low    Rehab Potential Poor    PT Frequency 1x / week    PT Duration 8 weeks    PT Treatment/Interventions ADLs/Self Care Home Management;Electrical Stimulation;Cryotherapy;Iontophoresis 4mg /ml Dexamethasone;Moist Heat;Traction;DME Instruction;Neuromuscular re-education;Patient/family education;Manual techniques;Passive range of motion;Taping    PT Next Visit Plan work on balance with patient,    PT Home Exercise Plan LTR; hamstring stretch, tennis ball trigger point release ; abdominal breathing;Access Code: [email or web address].Seated Upper Trapezius Stretch - 1 x daily - 7 x weekly - 1 sets - 3 reps - 20 hold.Gentle Levator Scapulae Stretch - 1 x daily - 7 x weekly - 1 sets - 3 reps - 20 hold.Shoulder extension with resistance - Neutral - 1 x daily - 7 x weekly - 3 sets - 10 reps.Scapular Retraction with Resistance - 1 x daily - 7 x weekly - 3 sets - 10 reps    Consulted and Agree with Plan of Care Patient           Patient will benefit from skilled therapeutic intervention in order to improve the following deficits and impairments:  Abnormal gait, Difficulty walking, Decreased range of motion, Decreased mobility, Decreased strength, Postural dysfunction, Pain, Increased muscle spasms  Visit Diagnosis: Chronic bilateral low back pain  without sciatica  Muscle weakness (generalized)  Difficulty in walking, not elsewhere classified  Cervicalgia  Cramp and spasm     Problem List Patient Active Problem List   Diagnosis Date Noted  . Sjogren's disease ([place]) 05/23/2020  . Primary osteoarthritis of both hands 05/23/2020  . Primary osteoarthritis of both feet 05/23/2020  . Sacroiliitis, not elsewhere classified ([place]) 12/22/2019  . Other secondary scoliosis, lumbar region 12/22/2019  . Arthritis of facet joint of lumbar spine 12/22/2019  . Cervical radiculopathy 10/18/2019  . Numbness 09/21/2019  . Bilateral carpal tunnel syndrome 09/21/2019  . High risk medication use 09/21/2019  . Memory loss 09/21/2019  . Essential hypertension 08/25/2018  . Abnormal SPEP 02/19/2018  . Hand pain 02/20/2017  . Multiple joint pain 02/18/2017  . Right elbow pain 12/09/2016  . Disturbed cognition 06/25/2016  . Trochanteric bursitis of left hip 02/18/2016  . Sciatica, right side 10/30/2015  . Bilateral arm pain 10/10/2015  . Trochanteric bursitis of right hip 08/04/2014  . Chronic fatigue 08/04/2014  . Urinary  frequency 08/04/2014  . Abnormality of gait 08/04/2014  . Unspecified visual disturbance 01/31/2013  . Transient vision disturbance 01/31/2013  . Routine general medical examination at a health care facility 11/04/2011  . Colon polyps 11/03/2011  . Hot flashes, menopausal 10/13/2011  . POSTHERPETIC NEURALGIA 10/03/2009  . DISPLCMT LUMBAR INTERVERT [facility] W/O MYELOPATHY 09/05/2009  . RENAL CALCULUS, RECURRENT 09/04/2009  . Hyperlipidemia 08/31/2009  . MULTIPLE SCLEROSIS, PROGRESSIVE/RELAPSING 08/31/2009  . ALLERGIC RHINITIS 08/31/2009    Carney Living PT DPT  06/01/2020, 11:31 AM  [facility] [address] [place], [place], [address] Phone: [phone]   Fax:  [phone]  Name: Dawn [name] MRN: [id number] Date of Birth: Mar 31, 1946

## 2020-06-04 ENCOUNTER — Encounter: Payer: Self-pay | Admitting: Physical Therapy

## 2020-06-04 ENCOUNTER — Ambulatory Visit: Payer: Medicare Other | Admitting: Physical Therapy

## 2020-06-04 ENCOUNTER — Other Ambulatory Visit: Payer: Self-pay

## 2020-06-04 DIAGNOSIS — R252 Cramp and spasm: Secondary | ICD-10-CM

## 2020-06-04 DIAGNOSIS — G8929 Other chronic pain: Secondary | ICD-10-CM

## 2020-06-04 DIAGNOSIS — M542 Cervicalgia: Secondary | ICD-10-CM

## 2020-06-04 DIAGNOSIS — R262 Difficulty in walking, not elsewhere classified: Secondary | ICD-10-CM

## 2020-06-04 DIAGNOSIS — M6281 Muscle weakness (generalized): Secondary | ICD-10-CM | POA: Diagnosis not present

## 2020-06-04 DIAGNOSIS — M545 Low back pain, unspecified: Secondary | ICD-10-CM | POA: Diagnosis not present

## 2020-06-04 NOTE — Therapy (Signed)
[place], [place], [address] Phone: [phone]   Fax:  [phone]  Physical Therapy Treatment  Patient Details  Name: Dawn [name] MRN: [id number] Date of Birth: 25-Oct-1945 Referring Provider (PT): Dr Christell Constant   Encounter Date: 06/04/2020   PT End of Session - 06/04/20 1020    Visit Number 48    Number of Visits 80    Date for PT Re-Evaluation 07/09/20    Authorization Type progress note done on 50 th visit Next at 27    PT Start Time 1015    PT Stop Time 1058    PT Time Calculation (min) 43 min    Equipment Utilized During Treatment Gait belt    Activity Tolerance Patient tolerated treatment well    Behavior During Therapy [facility] for tasks assessed/performed           Past Medical History:  Diagnosis Date   Allergy    Dry eyes    Endometrial polyp    Essential hypertension 08/25/2018   GERD (gastroesophageal reflux disease)    History of kidney stones    Hot flashes, menopausal 10/13/2011   Estradiol started    Jaundice as teenager   no problems since   Memory loss 09/21/2019     1/2 of feet numb all the time   Multiple sclerosis ([place]) dx 2001   Neuropathy    Osteoarthritis    Sjogren's syndrome ([place]) dx oct 2021   sore muscvles, dry mouth and eyes   Vision abnormalities     Past Surgical History:  Procedure Laterality Date   ABDOMINAL HYSTERECTOMY  2002   partial   colonscopy  2011   LUMBAR FUSION  2001   UPPER GI ENDOSCOPY  yrs ago    There were no vitals filed for this visit.   Subjective Assessment - 06/04/20 1018    Subjective Patient reports her pain is better but it is still there.    Pertinent History MS    How long can you stand comfortably? hurts as soon as she stands. Has to shift frequently    How long can you walk comfortably? limited community ambualtion    Diagnostic tests Nothing recent    Currently in Pain? Yes    Pain Score 6      Pain Orientation Right    Pain Descriptors / Indicators Aching    Pain Type Chronic pain    Pain Onset More than a month ago    Pain Frequency Constant    Aggravating Factors  standing and walking    Pain Relieving Factors rest and stretching    Effect of Pain on Daily Activities difficulty walking                             [facility] Adult PT Treatment/Exercise - 06/04/20 0001      Neck Exercises: Seated   Other Seated Exercise laq 2x15; hamstring curl yellow 2x10; clamshells 2x20 yellow;        Lumbar Exercises: Stretches   Lower Trunk Rotation Limitations x20    Piriformis Stretch Limitations 3x20 sec holdleft only today      Lumbar Exercises: Supine   AB Set Limitations reviewed abdomianl breathing     Pelvic Tilt Limitations x10                   PT Education - 06/04/20 1020    Education Details  HEP and symptom mangement    Person(s) Educated Patient    Methods Explanation;Demonstration;Tactile cues;Verbal cues    Comprehension Verbalized understanding;Returned demonstration;Verbal cues required;Tactile cues required            PT Short Term Goals - 03/28/20 1446      PT SHORT TERM GOAL #1   Title Patient will report no pain radiating into anterior thigh    Baseline no    Time 4    Period Weeks    Status Achieved    Target Date 12/09/19      PT SHORT TERM GOAL #2   Title Patient will increase bilateral LE strength to 4/5    Baseline 4+/5 gross    Time 4    Period Weeks    Status Achieved      PT SHORT TERM GOAL #3   Title Patient will increase passive right hip flexion to 105 degrrees    Baseline 100 today with pain onthe right side    Time 4    Period Weeks    Status On-going      PT SHORT TERM GOAL #4   Title Patient will increase right cervical rotation by 10 degrees    Baseline to 58 degres today    Time 4    Period Weeks    Status On-going    Target Date 01/09/20      PT SHORT TERM GOAL #5   Title Patient will  increase ross bilateral UE strength to 4+/5    Baseline improved to 4/5    Time 4    Period Weeks    Status On-going             PT Long Term Goals - 04/30/20 1430      PT LONG TERM GOAL #1   Title Patient will stand for 30 min without self reported increase pain in order to stand at a kitchen sink.    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     Baseline pain at edn range hip flexion and ir but intensity decreased    Time 6    Period Weeks    Status On-going      PT LONG TERM GOAL #4   Title Patient will reach behind his head without pain in order to perfrom ADL's    Baseline mild pain and tightness but improved    Time 6    Period Weeks    Status On-going      PT LONG TERM GOAL #5   Title Patient will report no radicular pain into her hands    Baseline more intermittent    Time 6    Period Weeks    Status On-going      PT LONG TERM GOAL #6   Title POatient will improve ABC score to 50% function in order to decrease her risk of fall    Baseline 40%  Period Weeks    Status On-going                 Plan - 06/04/20 1158    Clinical Impression Statement Patient reported a signifcant improvement in right hip mobility following manual therapy and needling. Therapy added posterior joint mobilization to POC. She has been working on her exercises and working in the pool on her own. She reports she will likley take a walk today following treatment. She tolerated light ther-ex well follwing treatment. Therapy will continue to progress ther-ex and balance exercises as tolerated.     Personal Factors and Comorbidities Comorbidity 1;Comorbidity 2    Comorbidities MS, OA    Examination-Activity Limitations Locomotion Level;Carry;Stand;Lift;Stairs;Squat    Examination-Participation Restrictions Meal Prep;Cleaning;Community Activity;Laundry;Shop    Stability/Clinical Decision Making Stable/Uncomplicated    Clinical Decision Making Low    Rehab Potential Poor    PT Duration 8 weeks    PT Treatment/Interventions ADLs/Self Care Home Management;Electrical Stimulation;Cryotherapy;Iontophoresis 4mg /ml Dexamethasone;Moist Heat;Traction;DME Instruction;Neuromuscular re-education;Patient/family education;Manual techniques;Passive range of motion;Taping    PT Next Visit Plan work on balance with patient,    PT Home Exercise Plan LTR; hamstring stretch, tennis ball trigger point release ; abdominal breathing;Access Code: [email or web address] Upper Trapezius Stretch - 1 x daily - 7 x weekly - 1 sets - 3 reps - 20 holdGentle Levator Scapulae Stretch - 1 x daily - 7 x weekly - 1 sets - 3 reps - 20 holdShoulder extension with resistance - Neutral - 1 x daily - 7 x weekly - 3 sets - 10 repsScapular Retraction with Resistance - 1 x daily - 7 x weekly - 3 sets - 10 reps    Consulted and Agree with Plan of Care Patient           Patient will benefit from skilled therapeutic intervention in order to improve the following deficits and impairments:  Abnormal gait, Difficulty walking, Decreased range of motion, Decreased mobility, Decreased strength, Postural dysfunction, Pain, Increased muscle spasms  Visit Diagnosis: Chronic bilateral low back pain without sciatica  Muscle weakness (generalized)  Difficulty in walking, not elsewhere classified  Cervicalgia  Cramp and spasm     Problem List Patient Active Problem List   Diagnosis Date Noted   Sjogren's disease ([place]) 05/23/2020   Primary osteoarthritis of both hands 05/23/2020   Primary osteoarthritis of both feet  05/23/2020   Sacroiliitis, not elsewhere classified ([place]) 12/22/2019   Other secondary scoliosis, lumbar region 12/22/2019   Arthritis of facet joint of lumbar spine 12/22/2019   Cervical radiculopathy 10/18/2019   Numbness 09/21/2019   Bilateral carpal tunnel syndrome 09/21/2019   High risk medication use 09/21/2019   Memory loss 09/21/2019   Essential hypertension 08/25/2018   Abnormal SPEP 02/19/2018   Hand pain 02/20/2017   Multiple joint pain 02/18/2017   Right elbow pain 12/09/2016   Disturbed cognition 06/25/2016   Trochanteric bursitis of left hip 02/18/2016   Sciatica, right side 10/30/2015   Bilateral arm pain 10/10/2015   Trochanteric bursitis of right hip 08/04/2014   Chronic fatigue 08/04/2014   Urinary frequency 08/04/2014   Abnormality of gait 08/04/2014   Unspecified visual disturbance 01/31/2013   Transient vision disturbance 01/31/2013   Routine general medical examination at a health care facility 11/04/2011   Colon polyps 11/03/2011   Hot flashes, menopausal 10/13/2011   POSTHERPETIC NEURALGIA 10/03/2009   DISPLCMT LUMBAR INTERVERT [facility] W/O MYELOPATHY 09/05/2009   RENAL CALCULUS, RECURRENT 09/04/2009   Hyperlipidemia 08/31/2009   MULTIPLE SCLEROSIS, PROGRESSIVE/RELAPSING  08/31/2009   ALLERGIC RHINITIS 08/31/2009    Carney Living PT DPT 06/04/2020, 12:09 PM  [facility] [address] [place], [place], [address] Phone: [phone]   Fax:  [phone]  Name: Dawn [name] MRN: [id number] Date of Birth: 1945-12-30

## 2020-06-05 NOTE — Telephone Encounter (Signed)
We can try to get her worked in next week.

## 2020-06-05 NOTE — Telephone Encounter (Signed)
LVM for pt to call office back. Offered appt tomorrow at 10am with Dr. Felecia Shelling. Asked she call first thing in the morning or send mychart message if she can accept this.

## 2020-06-06 ENCOUNTER — Ambulatory Visit (INDEPENDENT_AMBULATORY_CARE_PROVIDER_SITE_OTHER): Payer: Medicare Other | Admitting: Neurology

## 2020-06-06 ENCOUNTER — Encounter: Payer: Self-pay | Admitting: Neurology

## 2020-06-06 ENCOUNTER — Other Ambulatory Visit (HOSPITAL_COMMUNITY): Payer: Medicare Other

## 2020-06-06 VITALS — BP 156/78 | HR 71 | Ht 62.0 in | Wt 178.5 lb

## 2020-06-06 DIAGNOSIS — R269 Unspecified abnormalities of gait and mobility: Secondary | ICD-10-CM

## 2020-06-06 DIAGNOSIS — G35 Multiple sclerosis: Secondary | ICD-10-CM

## 2020-06-06 DIAGNOSIS — G35D Multiple sclerosis, unspecified: Secondary | ICD-10-CM

## 2020-06-06 DIAGNOSIS — M7061 Trochanteric bursitis, right hip: Secondary | ICD-10-CM

## 2020-06-06 DIAGNOSIS — Z79899 Other long term (current) drug therapy: Secondary | ICD-10-CM

## 2020-06-06 DIAGNOSIS — R5382 Chronic fatigue, unspecified: Secondary | ICD-10-CM | POA: Diagnosis not present

## 2020-06-06 DIAGNOSIS — M7062 Trochanteric bursitis, left hip: Secondary | ICD-10-CM | POA: Diagnosis not present

## 2020-06-06 NOTE — Telephone Encounter (Signed)
Called and spoke with pt. She did not receive message last night. She accepted appt at 10am today. Advised her to check in by 9:30/9:45am.  She has surgery to have polyp removed from uterus this Friday. Spoke with Dr. Felecia Shelling and advised that it is ok for her to come in for injections prior to surgery.

## 2020-06-06 NOTE — Progress Notes (Signed)
[facility]  PATIENT: Dawn [name] DOB: 06/13/1946  REFERRING CLINICIAN: Aura Dials HISTORY FROM: Patient REASON FOR VISIT: MS   HISTORICAL  CHIEF COMPLAINT:  Chief Complaint  Patient presents with   Follow-up    RM 12, alone. Here for work in inj. Ambulates with cane, no recent falls.     HISTORY OF PRESENT ILLNESS:  Dawn [name] is a 74 y.o. woman who was diagnosed with multiple sclerosis in 2001.      Update 04/04/2020: Her MS is stable without exacerbations.   However, she has had more pain   She is on Leflunomide [facility] had not covered [place] well for her in the past.).  She tolerates it well.    She does have a ental abscess right now.    She is reporting right > left hip pain.She has had 2 more ESI's (Dr. Ernestina Patches). The first one helped but her last ESI did not help the LBP/leg pain long term.  RFA was also discussed with her.   She feels the trochanteric bursa injections helped more than the ESI.    PT with needling has helped.   She also does Tai Chi and swimming at the [facility].    Her gait is reduced but no falls.   She uses a cane due to the right leg giving out on her now and then.  .    Balance is better since PT.        She is noting numbness but less pain in her hands.  At night, she gets painful tingling in her legs.  She drops things at times.     She sees Dr. Estanislado Pandy for her arthritis and was diagnosed with Sjogren's (postive SSA).  She has a dry mouth.  Tramadol had helped her pain in the past but she rarely takes as it makes her sleepy.       She is sleeping poorly.   Clonazepam helps insomnia someand spasticity at night as much as it used to.   She feels her cognition is about the same as last visit.      She has had more memory difficulties and is forgetful.     She denies anxiety or depression at this time.   She has fatigue and takes 1 Provigil every morning and sometimes another 1/2 pill in the afternoon.   She is  having a polypectomy via colposcopy tomorrow  _______________________________________________ MS History:    In 1994, she had an episode of severe vertigo with gait ataxia. An MRI at that time was reportedly normal. The symptoms were felt to be due to allergies. About 7 years later she had vertigo, gait ataxia and diplopia. Also her left leg was giving out. Short after that she had a hysterectomy. Postoperatively, she was numb in both legs and she had a lumbar puncture which showed changes consistent with MS. Then, an MRI of the brain was performed showing changes consistent with MS. She was referred to Dr. Erling Cruz who her on Betaseron. Last year, she switched from Betaseron to Tecfidera, in the hope that she would be able to avoid shots as she was having severe skin reactions with knots.    However, she had a lot of difficulty tolerating Tecfidera and swithced to [place] in early 2016 and to [place] late 2016.  Switched to Leflunomide for insurance reasons in 2019.    MRI brain 09/13/2018 shows multiple T2/flair hyperintense foci in the hemispheres.  The pattern is consistent with  chronic demyelinating plaque associated with multiple sclerosis though some foci likely also represent chronic microvascular ischemic change.  None of the foci enhance or appear to be acute and there is no change compared to the 2017 MRI.      Remote left cerebellar infarction.      There is a normal enhancement pattern and no acute findings.  REVIEW OF SYSTEMS:  Constitutional: No fevers, chills, sweats, or change in appetite.   Reports fatigue Eyes: No visual changes, double vision, eye pain Ear, nose and throat: No hearing loss, ear pain, nasal congestion, sore throat Cardiovascular: No chest pain, palpitations Respiratory:  No shortness of breath at rest or with exertion.   No wheezes GastrointestinaI: No nausea, vomiting, diarrhea, abdominal pain, fecal incontinence Genitourinary:  see above. Musculoskeletal:  Pain in  left greater than right hip Integumentary: No rash, pruritus.    Neurological: as above Psychiatric: No depression at this time.  No anxiety Endocrine: No palpitations, diaphoresis, change in appetite, change in weigh or increased thirst Hematologic/Lymphatic:  No anemia, purpura, petechiae. Allergic/Immunologic: No itchy/runny eyes, nasal congestion, recent allergic reactions, rashes  ALLERGIES: No Known Allergies  HOME MEDICATIONS: Outpatient Medications Prior to Visit  Medication Sig Dispense Refill   acetaminophen (TYLENOL) 500 MG tablet Take 2 tablets (1,000 mg total) by mouth in the morning and at bedtime. (Patient taking differently: Take 1,000 mg by mouth as needed. ) 30 tablet 0   aspirin EC 81 MG tablet Take 81 mg by mouth daily.     calcium carbonate (OS-CAL) 600 MG TABS Take 600 mg by mouth daily.     clonazePAM (KLONOPIN) 0.5 MG tablet Take 1 tablet by mouth at bedtime 90 tablet 1   fexofenadine (ALLEGRA) 180 MG tablet Take 180 mg by mouth at bedtime.      leflunomide (ARAVA) 20 MG tablet Take 1 tablet (20 mg total) by mouth daily. 90 tablet 4   losartan (COZAAR) 50 MG tablet Take 1 tablet (50 mg total) by mouth daily. 30 tablet 3   Misc Natural Products (SUPER-D3+) 5000 units CAPS Take by mouth daily.     modafinil (PROVIGIL) 200 MG tablet TAKE 1 TABLET EVERY MORNING AND ONE-HALF TABLET EVERY AFTERNOON (Patient taking differently: TAKE 1 TABLET EVERY MORNING) 45 tablet 5   omega-3 acid ethyl esters (LOVAZA) 1 g capsule Take by mouth daily.     omeprazole (PRILOSEC) 40 MG capsule TAKE 1 CAPSULE(40 MG) BY MOUTH DAILY (Patient taking differently: daily. ) 90 capsule 1   OVER THE COUNTER MEDICATION Act mouth rinse bid     Polyethylene Glycol 3350 (MIRALAX PO) Take by mouth daily as needed.      solifenacin (VESICARE) 5 MG tablet TAKE 1 TABLET BY MOUTH EVERY DAY (Patient taking differently: at bedtime. ) 90 tablet 1   traMADol (ULTRAM) 50 MG tablet Take 1 tablet  (50 mg total) by mouth at bedtime. (Patient taking differently: Take 50 mg by mouth as needed. ) 30 tablet 5   Turmeric (QC TUMERIC COMPLEX) 500 MG CAPS Take by mouth daily.     estradiol (CLIMARA - DOSED IN MG/24 HR) 0.025 mg/24hr patch Place 0.025 mg onto the skin once a week. (Patient not taking: Reported on 05/23/2020)     No facility-administered medications prior to visit.    PAST MEDICAL HISTORY: Past Medical History:  Diagnosis Date   Allergy    Dry eyes    Endometrial polyp    Essential hypertension 08/25/2018   GERD (gastroesophageal reflux  disease)    History of kidney stones    Hot flashes, menopausal 10/13/2011   Estradiol started    Jaundice as teenager   no problems since   Memory loss 09/21/2019     1/2 of feet numb all the time   Multiple sclerosis ([place]) dx 2001   Neuropathy    Osteoarthritis    Sjogren's syndrome ([place]) dx oct 2021   sore muscvles, dry mouth and eyes   Vision abnormalities     PAST SURGICAL HISTORY: Past Surgical History:  Procedure Laterality Date   ABDOMINAL HYSTERECTOMY  2002   partial   colonscopy  2011   LUMBAR FUSION  2001   UPPER GI ENDOSCOPY  yrs ago    FAMILY HISTORY: Family History  Problem Relation Age of Onset   Heart failure Mother    Heart failure Father    Arthritis Other    Hypertension Other     SOCIAL HISTORY:  Social History   Socioeconomic History   Marital status: Widowed    Spouse name: Not on file   Number of children: Not on file   Years of education: Not on file   Highest education level: Not on file  Occupational History   Occupation: retired  Tobacco Use   Smoking status: Former Smoker    Packs/day: 0.50    Years: 15.00    Pack years: 7.50    Types: Cigarettes    Quit date: 08/05/1983    Years since quitting: 36.8   Smokeless tobacco: Never Used  Vaping Use   Vaping Use: Never used  Substance and Sexual Activity   Alcohol use: Yes    Alcohol/week: 0.0  standard drinks    Comment: rarely   Drug use: No   Sexual activity: Not Currently    Birth control/protection: Post-menopausal  Other Topics Concern   Not on file  Social History Narrative   Regular Exercise-no   Widowed   Retired   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  Teaches at [employer], [employer], [employer] for older adults    Grew up in [place] and then in [place].     Tobacco use, amount per day now: former   Past tobacco use, amount per day: 1/2-1 packet   How many years did you use tobacco: 12 years   Alcohol use (drinks per week): 0   Diet: mediterranean based   Do you drink/eat things with caffeine: yes   Marital status:        widow                          What year were you married? 1987   Do you live in a house, apartment, assisted living, condo, trailer, etc.? house   Is it one or more stories? 1 story   How many persons live in your home? 1   Do you have pets in your home?( please list) no   Current or past profession: Science writer    Do you exercise?          yes                        Type and how often? Tai chi and pt therapy, stretches etc....   Do you have a living will? yes   Do you have a DNR form?  no                             If not, do you want to discuss one? yes   Do you have signed POA/HPOA forms?      no                  If so, please bring to you appointment   Social Determinants of Health   Financial Resource Strain:    Difficulty of Paying Living Expenses: Not on file  Food Insecurity:    Worried About [place] in the Last Year: Not on file   Ran Out of Food in the Last Year: Not on file  Transportation Needs:    Lack of Transportation (Medical): Not on file   Lack of Transportation (Non-Medical): Not on file  Physical Activity:    Days of Exercise per Week: Not on file   Minutes of Exercise per Session: Not on file  Stress:    Feeling of  Stress : Not on file  Social Connections:    Frequency of Communication with Friends and Family: Not on file   Frequency of Social Gatherings with Friends and Family: Not on file   Attends Religious Services: Not on file   Active Member of Clubs or Organizations: Not on file   Attends Archivist Meetings: Not on file   Marital Status: Not on file  Intimate Partner Violence:    Fear of Current or Ex-Partner: Not on file   Emotionally Abused: Not on file   Physically Abused: Not on file   Sexually Abused: Not on file     PHYSICAL EXAM  Vitals:   06/06/20 0940  BP: (!) 156/78  Pulse: 71  Weight: 178 lb 8 oz (81 kg)  Height: '5\' 2"'  (1.575 m)    Body mass index is 32.65 kg/m.   General: The patient is well-developed and well-nourished and in no acute distress  Musculoskeletal: She has right > left  trochanteric bursa tenderness.     Neurologic Exam  Mental status: The patient is alert and oriented x 3 at the time of the examination.   apparently normal attention span, stm and concentration ability.   Speech is normal.      Cranial nerves: Extraocular movements are full.  Facial strength and sensory are normal.  Hearing appeared to be symmetric  Motor:  Muscle bulk is normal but tone is symmetric, more on the left.. Strength is 4/5 in median innervated and 4+/5 and ulnar innervated intrinsic hand muscles   Sensory:   Sensory testing is intact to touch and vibration in the arms.  She has reduced vibration sensation in the feet.  Coordination: Finger-nose-finger and heel-to-shin is performed better on the right than the left.  Gait and station: Station is normal.  Gait is wide and tandem gait is poor.  Romberg negative. Reflexes: Deep tendon reflexes are symmetric and normal bilaterally.      DIAGNOSTIC DATA (LABS, IMAGING, TESTING) - I reviewed patient records, labs, notes, testing and imaging myself where available.  Lab Results  Component Value  Date   WBC 5.0 04/04/2020   HGB 13.2 04/04/2020   HCT 40.9 04/04/2020   MCV 92 04/04/2020   PLT 211 04/04/2020      Component Value Date/Time   NA 139 04/04/2020 1132   K 4.5 04/04/2020 1132   CL 103 04/04/2020 1132   CO2  22 04/04/2020 1132   GLUCOSE 83 04/04/2020 1132   GLUCOSE 88 03/09/2018 1342   BUN 24 04/04/2020 1132   CREATININE 0.77 04/04/2020 1132   CREATININE 0.75 03/09/2018 1342   CALCIUM 9.5 04/04/2020 1132   PROT 6.7 04/04/2020 1132   ALBUMIN 4.5 04/04/2020 1132   AST 38 04/04/2020 1132   AST 20 03/09/2018 1342   ALT 65 (H) 04/04/2020 1132   ALT 22 03/09/2018 1342   ALKPHOS 192 (H) 04/04/2020 1132   BILITOT 0.2 04/04/2020 1132   BILITOT 0.4 03/09/2018 1342   GFRNONAA 77 04/04/2020 1132   GFRNONAA >60 03/09/2018 1342   GFRAA 89 04/04/2020 1132   GFRAA >60 03/09/2018 1342   Lab Results  Component Value Date   CHOL 242 (H) 10/17/2019   HDL 47 (L) 10/17/2019   LDLCALC 161 (H) 10/17/2019   LDLDIRECT 130.2 11/03/2011   TRIG 187 (H) 10/17/2019   CHOLHDL 5.1 (H) 10/17/2019   No results found for: HGBA1C No results found for: VITAMINB12 Lab Results  Component Value Date   TSH 4.260 09/21/2019       ASSESSMENT AND PLAN  Multiple sclerosis (HCC)  Abnormality of gait  Chronic fatigue  High risk medication use  Trochanteric bursitis of both hips   1.    She will continue leflunomide for MS.   Recent labs were fine.    2.    Continue exercises and physical therapy.   3.    Bilateral trochanteric bursae injections with 80 mg Depo-Medrol in 5 cc Marcaine using sterile technique.   She tolerated the injections well 4.    Continue  clonazepam and modafinil.  5.    Return to see me in about 6 months but is advised to call sooner if she has new or worsening neurologic symptoms.   Thayer Inabinet A. Felecia Shelling, MD, PhD 83/33/8329, 19:16 PM Certified in Neurology, Clinical Neurophysiology, Sleep Medicine, Pain Medicine and Neuroimaging  [facility] [address], [place], [place] [address] [phone]-' \

## 2020-06-07 DIAGNOSIS — Z87891 Personal history of nicotine dependence: Secondary | ICD-10-CM | POA: Diagnosis not present

## 2020-06-07 DIAGNOSIS — Z79899 Other long term (current) drug therapy: Secondary | ICD-10-CM | POA: Diagnosis not present

## 2020-06-07 DIAGNOSIS — G35 Multiple sclerosis: Secondary | ICD-10-CM | POA: Diagnosis not present

## 2020-06-07 DIAGNOSIS — K219 Gastro-esophageal reflux disease without esophagitis: Secondary | ICD-10-CM | POA: Diagnosis not present

## 2020-06-07 DIAGNOSIS — Z20822 Contact with and (suspected) exposure to covid-19: Secondary | ICD-10-CM | POA: Diagnosis not present

## 2020-06-07 DIAGNOSIS — N840 Polyp of corpus uteri: Secondary | ICD-10-CM | POA: Diagnosis not present

## 2020-06-07 DIAGNOSIS — N950 Postmenopausal bleeding: Secondary | ICD-10-CM | POA: Diagnosis not present

## 2020-06-07 DIAGNOSIS — Z79890 Hormone replacement therapy (postmenopausal): Secondary | ICD-10-CM | POA: Diagnosis not present

## 2020-06-07 DIAGNOSIS — I10 Essential (primary) hypertension: Secondary | ICD-10-CM | POA: Diagnosis not present

## 2020-06-07 DIAGNOSIS — Z7982 Long term (current) use of aspirin: Secondary | ICD-10-CM | POA: Diagnosis not present

## 2020-06-07 LAB — RESPIRATORY PANEL BY RT PCR (FLU A&B, COVID)
Influenza A by PCR: NEGATIVE
Influenza B by PCR: NEGATIVE
SARS Coronavirus 2 by RT PCR: NEGATIVE

## 2020-06-07 NOTE — Anesthesia Preprocedure Evaluation (Addendum)
Anesthesia Evaluation  Patient identified by MRN, date of birth, ID band Patient awake    Reviewed: Allergy & Precautions, NPO status , Patient's Chart, lab work & pertinent test results  Airway Mallampati: II  TM Distance: >3 FB Neck ROM: Full    Dental no notable dental hx. (+) Teeth Intact, Dental Advisory Given   Pulmonary former smoker,    Pulmonary exam normal breath sounds clear to auscultation       Cardiovascular Exercise Tolerance: Good hypertension, Pt. on medications Normal cardiovascular exam Rhythm:Regular Rate:Normal     Neuro/Psych Pt w Hx of MS  R side weaker than L   Neuromuscular disease negative psych ROS   GI/Hepatic GERD  Controlled and Medicated,  Endo/Other  negative endocrine ROS  Renal/GU Renal disease     Musculoskeletal  (+) Arthritis ,   Abdominal   Peds  Hematology   Anesthesia Other Findings   Reproductive/Obstetrics                            Anesthesia Physical Anesthesia Plan  ASA: II  Anesthesia Plan: General   Post-op Pain Management:    Induction: Intravenous  PONV Risk Score and Plan: 4 or greater and Treatment may vary due to age or medical condition, Ondansetron, Dexamethasone and Midazolam  Airway Management Planned: LMA  Additional Equipment: None  Intra-op Plan:   Post-operative Plan:   Informed Consent: I have reviewed the patients History and Physical, chart, labs and discussed the procedure including the risks, benefits and alternatives for the proposed anesthesia with the patient or authorized representative who has indicated his/her understanding and acceptance.     Dental advisory given  Plan Discussed with:   Anesthesia Plan Comments:        Anesthesia Quick Evaluation

## 2020-06-08 ENCOUNTER — Ambulatory Visit (HOSPITAL_BASED_OUTPATIENT_CLINIC_OR_DEPARTMENT_OTHER): Payer: Medicare Other | Admitting: Anesthesiology

## 2020-06-08 ENCOUNTER — Ambulatory Visit (HOSPITAL_BASED_OUTPATIENT_CLINIC_OR_DEPARTMENT_OTHER)
Admission: RE | Admit: 2020-06-08 | Discharge: 2020-06-08 | Disposition: A | Discharge status: Home / Self Care | Payer: Medicare Other | Attending: Obstetrics and Gynecology | Admitting: Obstetrics and Gynecology

## 2020-06-08 ENCOUNTER — Encounter (HOSPITAL_BASED_OUTPATIENT_CLINIC_OR_DEPARTMENT_OTHER)
Admission: RE | Disposition: A | Discharge status: Home / Self Care | Payer: Self-pay | Source: Home / Self Care | Attending: Obstetrics and Gynecology

## 2020-06-08 ENCOUNTER — Encounter (HOSPITAL_BASED_OUTPATIENT_CLINIC_OR_DEPARTMENT_OTHER): Payer: Self-pay | Admitting: Obstetrics and Gynecology

## 2020-06-08 ENCOUNTER — Other Ambulatory Visit: Payer: Self-pay

## 2020-06-08 DIAGNOSIS — Z20822 Contact with and (suspected) exposure to covid-19: Secondary | ICD-10-CM | POA: Diagnosis not present

## 2020-06-08 DIAGNOSIS — Z79890 Hormone replacement therapy (postmenopausal): Secondary | ICD-10-CM | POA: Insufficient documentation

## 2020-06-08 DIAGNOSIS — G35 Multiple sclerosis: Secondary | ICD-10-CM | POA: Diagnosis not present

## 2020-06-08 DIAGNOSIS — N840 Polyp of corpus uteri: Secondary | ICD-10-CM | POA: Insufficient documentation

## 2020-06-08 DIAGNOSIS — K219 Gastro-esophageal reflux disease without esophagitis: Secondary | ICD-10-CM | POA: Insufficient documentation

## 2020-06-08 DIAGNOSIS — Z79899 Other long term (current) drug therapy: Secondary | ICD-10-CM | POA: Insufficient documentation

## 2020-06-08 DIAGNOSIS — Z87891 Personal history of nicotine dependence: Secondary | ICD-10-CM | POA: Insufficient documentation

## 2020-06-08 DIAGNOSIS — N950 Postmenopausal bleeding: Secondary | ICD-10-CM | POA: Diagnosis not present

## 2020-06-08 DIAGNOSIS — Z7982 Long term (current) use of aspirin: Secondary | ICD-10-CM | POA: Insufficient documentation

## 2020-06-08 DIAGNOSIS — E785 Hyperlipidemia, unspecified: Secondary | ICD-10-CM | POA: Diagnosis not present

## 2020-06-08 DIAGNOSIS — I10 Essential (primary) hypertension: Secondary | ICD-10-CM | POA: Insufficient documentation

## 2020-06-08 DIAGNOSIS — M19041 Primary osteoarthritis, right hand: Secondary | ICD-10-CM | POA: Diagnosis not present

## 2020-06-08 HISTORY — DX: Sjogren syndrome, unspecified: M35.00

## 2020-06-08 HISTORY — PX: DILATATION & CURETTAGE/HYSTEROSCOPY WITH MYOSURE: SHX6511

## 2020-06-08 HISTORY — DX: Personal history of urinary calculi: Z87.442

## 2020-06-08 HISTORY — DX: Dry eye syndrome of bilateral lacrimal glands: H04.123

## 2020-06-08 HISTORY — DX: Unspecified jaundice: R17

## 2020-06-08 HISTORY — DX: Gastro-esophageal reflux disease without esophagitis: K21.9

## 2020-06-08 HISTORY — DX: Polyp of corpus uteri: N84.0

## 2020-06-08 LAB — POCT I-STAT, CHEM 8
BUN: 24 mg/dL — ABNORMAL HIGH (ref 8–23)
Calcium, Ion: 1.05 mmol/L — ABNORMAL LOW (ref 1.15–1.40)
Chloride: 107 mmol/L (ref 98–111)
Creatinine, Ser: 0.6 mg/dL (ref 0.44–1.00)
Glucose, Bld: 86 mg/dL (ref 70–99)
HCT: 43 % (ref 36.0–46.0)
Hemoglobin: 14.6 g/dL (ref 12.0–15.0)
Potassium: 3.8 mmol/L (ref 3.5–5.1)
Sodium: 142 mmol/L (ref 135–145)
TCO2: 23 mmol/L (ref 22–32)

## 2020-06-08 LAB — CBC
HCT: 42.7 % (ref 36.0–46.0)
Hemoglobin: 13.9 g/dL (ref 12.0–15.0)
MCH: 29.7 pg (ref 26.0–34.0)
MCHC: 32.6 g/dL (ref 30.0–36.0)
MCV: 91.2 fL (ref 80.0–100.0)
Platelets: 213 10*3/uL (ref 150–400)
RBC: 4.68 MIL/uL (ref 3.87–5.11)
RDW: 14.3 % (ref 11.5–15.5)
WBC: 5.5 10*3/uL (ref 4.0–10.5)
nRBC: 0 % (ref 0.0–0.2)

## 2020-06-08 SURGERY — DILATATION & CURETTAGE/HYSTEROSCOPY WITH MYOSURE
Anesthesia: General | Site: Vagina

## 2020-06-08 MED ORDER — EPHEDRINE 5 MG/ML INJ
INTRAVENOUS | Status: AC
  Filled 2020-06-08: qty 10

## 2020-06-08 MED ORDER — FENTANYL CITRATE (PF) 100 MCG/2ML IJ SOLN
INTRAMUSCULAR | Status: DC | PRN
  Administered 2020-06-08: 50 ug via INTRAVENOUS

## 2020-06-08 MED ORDER — ONDANSETRON HCL 4 MG/2ML IJ SOLN
INTRAMUSCULAR | Status: DC | PRN
  Administered 2020-06-08: 4 mg via INTRAVENOUS

## 2020-06-08 MED ORDER — DEXAMETHASONE SODIUM PHOSPHATE 10 MG/ML IJ SOLN
INTRAMUSCULAR | Status: DC | PRN
  Administered 2020-06-08: 8 mg via INTRAVENOUS

## 2020-06-08 MED ORDER — LIDOCAINE HCL 1 % IJ SOLN
INTRAMUSCULAR | Status: DC | PRN
  Administered 2020-06-08: 10 mL

## 2020-06-08 MED ORDER — POVIDONE-IODINE 10 % EX SWAB: 2.0000 "application " | Freq: Once | CUTANEOUS | Status: DC

## 2020-06-08 MED ORDER — PROPOFOL 10 MG/ML IV BOLUS
INTRAVENOUS | Status: DC | PRN
  Administered 2020-06-08: 160 mg via INTRAVENOUS

## 2020-06-08 MED ORDER — DEXAMETHASONE SODIUM PHOSPHATE 10 MG/ML IJ SOLN
INTRAMUSCULAR | Status: AC
  Filled 2020-06-08: qty 1

## 2020-06-08 MED ORDER — FENTANYL CITRATE (PF) 100 MCG/2ML IJ SOLN
INTRAMUSCULAR | Status: AC
  Filled 2020-06-08: qty 2

## 2020-06-08 MED ORDER — KETOROLAC TROMETHAMINE 30 MG/ML IJ SOLN
INTRAMUSCULAR | Status: DC | PRN
  Administered 2020-06-08: 30 mg via INTRAVENOUS

## 2020-06-08 MED ORDER — ONDANSETRON HCL 4 MG/2ML IJ SOLN: 4.0000 mg | Freq: Once | INTRAMUSCULAR | Status: DC | PRN

## 2020-06-08 MED ORDER — EPHEDRINE SULFATE-NACL 50-0.9 MG/10ML-% IV SOSY
PREFILLED_SYRINGE | INTRAVENOUS | Status: DC | PRN
  Administered 2020-06-08: 10 mg via INTRAVENOUS

## 2020-06-08 MED ORDER — LACTATED RINGERS IV SOLN: INTRAVENOUS | Status: DC

## 2020-06-08 MED ORDER — ACETAMINOPHEN 10 MG/ML IV SOLN: 1000.0000 mg | Freq: Once | INTRAVENOUS | Status: DC | PRN

## 2020-06-08 MED ORDER — LIDOCAINE 2% (20 MG/ML) 5 ML SYRINGE
INTRAMUSCULAR | Status: DC | PRN
  Administered 2020-06-08: 100 mg via INTRAVENOUS

## 2020-06-08 MED ORDER — FENTANYL CITRATE (PF) 100 MCG/2ML IJ SOLN: 25.0000 ug | INTRAMUSCULAR | Status: DC | PRN

## 2020-06-08 MED ORDER — ONDANSETRON HCL 4 MG/2ML IJ SOLN
INTRAMUSCULAR | Status: AC
  Filled 2020-06-08: qty 2

## 2020-06-08 MED ORDER — KETOROLAC TROMETHAMINE 30 MG/ML IJ SOLN
INTRAMUSCULAR | Status: AC
  Filled 2020-06-08: qty 1

## 2020-06-08 MED ORDER — SODIUM CHLORIDE 0.9 % IR SOLN
Status: DC | PRN
  Administered 2020-06-08: 3000 mL

## 2020-06-08 MED ORDER — LIDOCAINE 2% (20 MG/ML) 5 ML SYRINGE
INTRAMUSCULAR | Status: AC
  Filled 2020-06-08: qty 5

## 2020-06-08 MED ORDER — PROPOFOL 10 MG/ML IV BOLUS
INTRAVENOUS | Status: AC
  Filled 2020-06-08: qty 20

## 2020-06-08 SURGICAL SUPPLY — 19 items
CATH ROBINSON RED A/P 16FR (CATHETERS) ×2 IMPLANT
DEVICE MYOSURE REACH (MISCELLANEOUS) ×2 IMPLANT
DILATOR CANAL MILEX (MISCELLANEOUS) ×2 IMPLANT
GLOVE BIO SURGEON STRL SZ7 (GLOVE) ×2 IMPLANT
GLOVE BIOGEL PI IND STRL 7.0 (GLOVE) ×2 IMPLANT
GLOVE BIOGEL PI IND STRL 7.5 (GLOVE) ×1 IMPLANT
GLOVE BIOGEL PI INDICATOR 7.0 (GLOVE) ×2
GLOVE BIOGEL PI INDICATOR 7.5 (GLOVE) ×1
GLOVE ECLIPSE 6.5 STRL STRAW (GLOVE) ×2 IMPLANT
GLOVE SURG UNDER POLY LF SZ7 (GLOVE) ×2 IMPLANT
GOWN STRL REUS W/ TWL LRG LVL3 (GOWN DISPOSABLE) ×2 IMPLANT
GOWN STRL REUS W/TWL LRG LVL3 (GOWN DISPOSABLE) ×4
KIT PROCEDURE FLUENT (KITS) ×2 IMPLANT
KIT TURNOVER CYSTO (KITS) ×2 IMPLANT
PACK VAGINAL MINOR WOMEN LF (CUSTOM PROCEDURE TRAY) ×2 IMPLANT
PAD OB MATERNITY 4.3X12.25 (PERSONAL CARE ITEMS) ×2 IMPLANT
SEAL ROD LENS SCOPE MYOSURE (ABLATOR) ×2 IMPLANT
TOWEL OR 17X26 10 PK STRL BLUE (TOWEL DISPOSABLE) ×2 IMPLANT
UNDERPAD 30X36 HEAVY ABSORB (UNDERPADS AND DIAPERS) ×2 IMPLANT

## 2020-06-08 NOTE — H&P (Signed)
Dawn [name] is an 74 y.o. female. 53Y P0 menopausal female with PMB previously on HRT found to have endometrial polyps presents for scheduled hysteroscopy, polypectomy, and dilation and curettage. Patient experienced first episode of VB in Sept 2021. Initial work up in the office included 05/04/20 EMB which showed fragment of benign polyp, negative for hyperplasia and carcinoma, and TVUS which showed a thickened ES measuring 11 mm with diffuse cystic changes and internal blood flow. She had stopped using all HRT at that time. 05/24/20 SIS in office revealed 2-3 endometrial polyps measuring 1-2 cm each. Discussed recommendation for polypectomy at that time.   GYN history significant for 2 prior SAB's, OAB, and laparoscopic LSO  Medical history significant for MS, SS, GERD, hypercholesterolemia, and HTN  Menstrual History: Menopausal  Past Medical History:  Diagnosis Date  . Allergy   . Dry eyes   . Endometrial polyp   . Essential hypertension 08/25/2018  . GERD (gastroesophageal reflux disease)   . History of kidney stones   . Hot flashes, menopausal 10/13/2011   Estradiol started   . Jaundice as teenager   no problems since  . Memory loss 09/21/2019     1/2 of feet numb all the time  . Multiple sclerosis ([place]) dx 2001  . Neuropathy   . Osteoarthritis   . Sjogren's syndrome ([place]) dx oct 2021   sore muscvles, dry mouth and eyes  . Vision abnormalities       Family History  Problem Relation Age of Onset  . Heart failure Mother   . Heart failure Father   . Arthritis Other   . Hypertension Other     Social History:  reports that she quit smoking about 36 years ago. Her smoking use included cigarettes. She has a 7.50 pack-year smoking history. She has never used smokeless tobacco. She reports current alcohol use. She reports that she does not use drugs.  Allergies: No Known Allergies  Medications Prior to Admission  Medication Sig Dispense Refill Last Dose  .  acetaminophen (TYLENOL) 500 MG tablet Take 2 tablets (1,000 mg total) by mouth in the morning and at bedtime. (Patient taking differently: Take 1,000 mg by mouth as needed. ) 30 tablet 0 Past Week at Unknown time  . aspirin EC 81 MG tablet Take 81 mg by mouth daily.   Past Week at Unknown time  . calcium carbonate (OS-CAL) 600 MG TABS Take 600 mg by mouth daily.   Past Week at Unknown time  . clonazePAM (KLONOPIN) 0.5 MG tablet Take 1 tablet by mouth at bedtime 90 tablet 1 06/07/2020 at Unknown time  . estradiol (CLIMARA - DOSED IN MG/24 HR) 0.025 mg/24hr patch Place 0.025 mg onto the skin once a week.    Past Month at Unknown time  . fexofenadine (ALLEGRA) 180 MG tablet Take 180 mg by mouth at bedtime.    06/07/2020 at Unknown time  . leflunomide (ARAVA) 20 MG tablet Take 1 tablet (20 mg total) by mouth daily. 90 tablet 4 06/07/2020 at Unknown time  . losartan (COZAAR) 50 MG tablet Take 1 tablet (50 mg total) by mouth daily. 30 tablet 3 06/07/2020 at Unknown time  . Misc Natural Products (SUPER-D3+) 5000 units CAPS Take by mouth daily.   06/07/2020 at Unknown time  . modafinil (PROVIGIL) 200 MG tablet TAKE 1 TABLET EVERY MORNING AND ONE-HALF TABLET EVERY AFTERNOON (Patient taking differently: TAKE 1 TABLET EVERY MORNING) 45 tablet 5 06/07/2020 at Unknown time  . omega-3 acid ethyl  esters (LOVAZA) 1 g capsule Take by mouth daily.   Past Week at Unknown time  . omeprazole (PRILOSEC) 40 MG capsule TAKE 1 CAPSULE(40 MG) BY MOUTH DAILY (Patient taking differently: daily. ) 90 capsule 1 06/08/2020 at 0730  . OVER THE COUNTER MEDICATION Act mouth rinse bid   06/08/2020 at Unknown time  . solifenacin (VESICARE) 5 MG tablet TAKE 1 TABLET BY MOUTH EVERY DAY (Patient taking differently: at bedtime. ) 90 tablet 1 06/07/2020 at Unknown time  . traMADol (ULTRAM) 50 MG tablet Take 1 tablet (50 mg total) by mouth at bedtime. (Patient taking differently: Take 50 mg by mouth as needed. ) 30 tablet 5 Past Month at  Unknown time  . Turmeric (QC TUMERIC COMPLEX) 500 MG CAPS Take by mouth daily.   Past Week at Unknown time  . Polyethylene Glycol 3350 (MIRALAX PO) Take by mouth daily as needed.    More than a month at Unknown time    Review of Systems  All other systems reviewed and are negative.   Blood pressure (!) 187/84, pulse 68, temperature 98.5 F (36.9 C), temperature source Oral, resp. rate 20, height 5\' 2"  (1.575 m), weight 80.6 kg, SpO2 100 %. Physical Exam Vitals reviewed.  Constitutional:      Appearance: Normal appearance.  Cardiovascular:     Rate and Rhythm: Normal rate.  Pulmonary:     Effort: Pulmonary effort is normal.  Genitourinary:    General: Normal vulva.  Musculoskeletal:        General: Normal range of motion.     Cervical back: Normal range of motion.  Skin:    General: Skin is warm and dry.  Neurological:     General: No focal deficit present.     Mental Status: She is alert.  Psychiatric:        Mood and Affect: Mood normal.        Behavior: Behavior normal.     Results for orders placed or performed during the hospital encounter of 06/08/20 (from the past 24 hour(s))  CBC     Status: None   Collection Time: 06/08/20 10:30 AM  Result Value Ref Range   WBC 5.5 4.0 - 10.5 K/uL   RBC 4.68 3.87 - 5.11 MIL/uL   Hemoglobin 13.9 12.0 - 15.0 g/dL   HCT 42.7 36 - 46 %   MCV 91.2 80.0 - 100.0 fL   MCH 29.7 26.0 - 34.0 pg   MCHC 32.6 30.0 - 36.0 g/dL   RDW 14.3 11.5 - 15.5 %   Platelets 213 150 - 400 K/uL   nRBC 0.0 0.0 - 0.2 %  I-STAT, chem 8     Status: Abnormal   Collection Time: 06/08/20 10:42 AM  Result Value Ref Range   Sodium 142 135 - 145 mmol/L   Potassium 3.8 3.5 - 5.1 mmol/L   Chloride 107 98 - 111 mmol/L   BUN 24 (H) 8 - 23 mg/dL   Creatinine, Ser 0.60 0.44 - 1.00 mg/dL   Glucose, Bld 86 70 - 99 mg/dL   Calcium, Ion 1.05 (L) 1.15 - 1.40 mmol/L   TCO2 23 22 - 32 mmol/L   Hemoglobin 14.6 12.0 - 15.0 g/dL   HCT 43.0 36 - 46 %    No results  found.  Assessment/Plan: 64Y menopausal female with PMB with endometrial polyps  Consented for operative hysteroscopy, polypectomy, and dilation and curettage. Review of risks, including but not limited to bleeding, infection, and uterine perforation and  damage to surrounding organs, as well as benefits and alternatives.  -Routine preop care -No antibiotics indicated -SCD VTE ppx -Routine postop care -Anticipate DC home   Lanyla Costello A Pier Bosher 06/08/2020, 10:57 AM

## 2020-06-08 NOTE — Anesthesia Postprocedure Evaluation (Signed)
Anesthesia Post Note  Patient: Dawn [name]  Procedure(s) Performed: DILATATION & CURETTAGE/HYSTEROSCOPY/Polypectomy WITH MYOSURE (N/A Vagina )     Patient location during evaluation: PACU Anesthesia Type: General Level of consciousness: awake and alert Pain management: pain level controlled Vital Signs Assessment: post-procedure vital signs reviewed and stable Respiratory status: spontaneous breathing, nonlabored ventilation, respiratory function stable and patient connected to nasal cannula oxygen Cardiovascular status: blood pressure returned to baseline and stable Postop Assessment: no apparent nausea or vomiting Anesthetic complications: no   No complications documented.  Last Vitals:  Vitals:   06/08/20 1300 06/08/20 1330  BP: (!) 159/74 (!) 158/69  Pulse: 60 80  Resp: 10 16  Temp:  36.6 C  SpO2: 97% 98%    Last Pain:  Vitals:   06/08/20 1330  TempSrc:   PainSc: 1                  Barnet Glasgow

## 2020-06-08 NOTE — Transfer of Care (Signed)
Immediate Anesthesia Transfer of Care Note  Patient: Dawn [name]  Procedure(s) Performed: Procedure(s) (LRB): DILATATION & CURETTAGE/HYSTEROSCOPY/Polypectomy WITH MYOSURE (N/A)  Patient Location: PACU  Anesthesia Type: General  Level of Consciousness: awake, alert  and oriented  Airway & Oxygen Therapy: Patient Spontanous Breathing and Patient connected to nasal cannula oxygen  Post-op Assessment: Report given to PACU RN and Post -op Vital signs reviewed and stable  Post vital signs: Reviewed and stable  Complications: No apparent anesthesia complicationsLast Vitals:  Vitals Value Taken Time  BP 143/68 06/08/20 1224  Temp 36.6 C 06/08/20 1224  Pulse 65 06/08/20 1229  Resp 11 06/08/20 1229  SpO2 98 % 06/08/20 1229  Vitals shown include unvalidated device data.  Last Pain:  Vitals:   06/08/20 1026  TempSrc: Oral  PainSc: 0-No pain      Patients Stated Pain Goal: 3 (29/47/65 4650)  Complications: No complications documented.

## 2020-06-08 NOTE — Anesthesia Procedure Notes (Signed)
Procedure Name: LMA Insertion Date/Time: 06/08/2020 11:40 AM Performed by: Mechele Claude, CRNA Pre-anesthesia Checklist: Patient identified, Emergency Drugs available, Suction available and Patient being monitored Patient Re-evaluated:Patient Re-evaluated prior to induction Oxygen Delivery Method: Circle system utilized Preoxygenation: Pre-oxygenation with 100% oxygen Induction Type: IV induction Ventilation: Mask ventilation without difficulty LMA: LMA inserted LMA Size: 4.0 Number of attempts: 1 Airway Equipment and Method: Bite block Placement Confirmation: positive ETCO2 Tube secured with: Tape Dental Injury: Teeth and Oropharynx as per pre-operative assessment

## 2020-06-08 NOTE — Op Note (Signed)
Operative Note  PROCEDURE: operative hysteroscopy, polypectomy, dilation and curettage   PRE-OPERATIVE DIAGNOSIS: PMB, endometrial polyps  POST-OPERATIVE DIAGNOSIS: PMB, endometrial polyps  SURGEON: Dr. Langley Gauss, DO  ASSISTANT: N/A  FINDINGS: atrophic vaginal epithelium, normal appearing ectocervix without lesions, stenotic cervical os, uterine cavity with bilateral tubal ostia visualized, larger anterior wall/fundal polyp, smaller posterior/LUS polyps, and cervical canal polyps visualized  SPECIMENS: endometrial polyps, endometrial curettings  EBL: minimal  FLUIDS: Deficit 097 mL  COMPLICATIONS: None  PROCEDURE IN DETAIL:   After the patient was appropriately consented in the holding area, she was taken to the operating room where general anesthesia was administered without complications. The patient was placed in the dorsal lithotomy position. The patient was prepped and draped in the usual sterile fashion. The bladder was trained with a catheter. An appropriate time out was performed that verified the correct patient, procedure, and surgical team.   A sterile speculum was inserted into the vagina and the cervix was visualized. A single tooth tenaculum was used to grasp the anterior lip of the cervix. A paracervical block was obtained using 1% plain lidocaine injecting a total of 10 cc divided between the 4 and 8 o'clock positions. The cervix was noted to be stenotic and an os finder used to assist in dilation. The cervix was sequentially dilated up to a size 19 french. The hysteroscopy was introduced through the cervix and advanced to the uterine fundus. The uterine cavity distension was obtained without difficulty and the findings were noted as described above. The MyoSure 'Reach' was utilized to resect the polyps under direct visualization. The hysteroscopy was removed. A gentle sharp curetting was performed and specimens collected on a Telfa. All specimens were sent for final  pathology. The tenaculum was removed and excellent hemostasis was appreciated. Speculum was removed. Patient tolerated the procedure well and was taken to the recovery room in stable condition. All instrument and lap counts were correct.  [name] 06/08/20 12:23 PM

## 2020-06-08 NOTE — Discharge Instructions (Signed)
     Do not take any nonsteroidal anti inflammatories until after 6:00 pm today.  Post Anesthesia Home Care Instructions  Activity: Get plenty of rest for the remainder of the day. A responsible individual must stay with you for 24 hours following the procedure.  For the next 24 hours, DO NOT: -Drive a car -Paediatric nurse -Drink alcoholic beverages -Take any medication unless instructed by your physician -Make any legal decisions or sign important papers.  Meals: Start with liquid foods such as gelatin or soup. Progress to regular foods as tolerated. Avoid greasy, spicy, heavy foods. If nausea and/or vomiting occur, drink only clear liquids until the nausea and/or vomiting subsides. Call your physician if vomiting continues.  Special Instructions/Symptoms: Your throat may feel dry or sore from the anesthesia or the breathing tube placed in your throat during surgery. If this causes discomfort, gargle with warm salt water. The discomfort should disappear within 24 hours.

## 2020-06-11 ENCOUNTER — Telehealth: Payer: Self-pay

## 2020-06-11 ENCOUNTER — Ambulatory Visit: Payer: Medicare Other | Admitting: Physical Therapy

## 2020-06-11 ENCOUNTER — Other Ambulatory Visit: Payer: Self-pay

## 2020-06-11 ENCOUNTER — Encounter (HOSPITAL_BASED_OUTPATIENT_CLINIC_OR_DEPARTMENT_OTHER): Payer: Self-pay | Admitting: Obstetrics and Gynecology

## 2020-06-11 DIAGNOSIS — R252 Cramp and spasm: Secondary | ICD-10-CM

## 2020-06-11 DIAGNOSIS — M6281 Muscle weakness (generalized): Secondary | ICD-10-CM | POA: Diagnosis not present

## 2020-06-11 DIAGNOSIS — R262 Difficulty in walking, not elsewhere classified: Secondary | ICD-10-CM | POA: Diagnosis not present

## 2020-06-11 DIAGNOSIS — M542 Cervicalgia: Secondary | ICD-10-CM | POA: Diagnosis not present

## 2020-06-11 DIAGNOSIS — M545 Low back pain, unspecified: Secondary | ICD-10-CM

## 2020-06-11 DIAGNOSIS — G8929 Other chronic pain: Secondary | ICD-10-CM | POA: Diagnosis not present

## 2020-06-11 LAB — SURGICAL PATHOLOGY

## 2020-06-11 NOTE — Therapy (Signed)
[place], [place], [address] Phone: [phone]   Fax:  [phone]  Physical Therapy Treatment  Patient Details  Name: Dawn [name] MRN: [id number] Date of Birth: 15-May-1946 Referring Provider (PT): Dr Christell Constant   Encounter Date: 06/11/2020   PT End of Session - 06/11/20 1320    Visit Number 49    Number of Visits 67    Date for PT Re-Evaluation 07/09/20    Authorization Type progress note done on 47 th visit Next at 3    PT Start Time 1015    PT Stop Time 1056    PT Time Calculation (min) 41 min    Activity Tolerance Patient tolerated treatment well    Behavior During Therapy [facility] for tasks assessed/performed           Past Medical History:  Diagnosis Date  . Allergy   . Dry eyes   . Endometrial polyp   . Essential hypertension 08/25/2018  . GERD (gastroesophageal reflux disease)   . History of kidney stones   . Hot flashes, menopausal 10/13/2011   Estradiol started   . Jaundice as teenager   no problems since  . Memory loss 09/21/2019     1/2 of feet numb all the time  . Multiple sclerosis ([place]) dx 2001  . Neuropathy   . Osteoarthritis   . Sjogren's syndrome ([place]) dx oct 2021   sore muscvles, dry mouth and eyes  . Vision abnormalities     Past Surgical History:  Procedure Laterality Date  . ABDOMINAL HYSTERECTOMY  2002   partial  . colonscopy  2011  . DILATATION & CURETTAGE/HYSTEROSCOPY WITH MYOSURE N/A 06/08/2020   Procedure: DILATATION & CURETTAGE/HYSTEROSCOPY/Polypectomy WITH MYOSURE;  Surgeon: Armandina Stammer, DO;  Location: [facility];  Service: Gynecology;  Laterality: N/A;  . LUMBAR FUSION  2001  . UPPER GI ENDOSCOPY  yrs ago    There were no vitals filed for this visit.   Subjective Assessment - 06/11/20 1255    Subjective Patient had a procedure on Friday. She is feeling better following the procdure. It was an outpatient GI procedure. She also  had injections from Dr Felecia Shelling. She reports her hips and back continue to improve following the injections and dry needling last week.    How long can you stand comfortably? hurts as soon as she stands. Has to shift frequently    How long can you walk comfortably? limited community ambualtion    Diagnostic tests Nothing recent    Currently in Pain? Yes    Pain Score 3     Pain Location Hip    Pain Orientation Right;Left    Pain Descriptors / Indicators Aching    Pain Type Chronic pain    Pain Onset More than a month ago    Pain Frequency Constant    Aggravating Factors  standing and walking    Pain Relieving Factors rest and stretching    Effect of Pain on Daily Activities difficulty walking    Multiple Pain Sites No                             [facility] Adult PT Treatment/Exercise - 06/11/20 0001      Neck Exercises: Seated   Other Seated Exercise bilateral er 2x10 red; horzontal abduction red 2x10; ewand flexion 2x10; d2 flexion red in very limited range      Lumbar  Exercises: Stretches   Lower Trunk Rotation Limitations x20    Piriformis Stretch Limitations 3x20 sec holdleft only today      Lumbar Exercises: Standing   Heel Raises 20 reps      Lumbar Exercises: Seated   Other Seated Lumbar Exercises LAQ red 2x10; hamstring curl 2x10 seated clamshell x20 red       Manual Therapy   Manual Therapy Soft tissue mobilization    Manual therapy comments skilled palpation of trigger points     Joint Mobilization Long axis distraction    Soft tissue mobilization soft tissue mobilization to bilateral lumbar spine and bilateral gluteals    Passive ROM focus on left hip hip flexionIR/ER     Manual Traction LAD Grade II-III            Trigger Point Dry Needling - 06/11/20 0001    Consent Given? Yes    Gluteus Medius Response Twitch response elicited    Lumbar multifidi Response Twitch response elicited;Palpable increased muscle length                PT  Education - 06/11/20 1319    Education Details updated HEP for postural strengthening    Person(s) Educated Patient    Methods Explanation;Demonstration;Tactile cues;Verbal cues    Comprehension Returned demonstration;Verbal cues required;Tactile cues required;Verbalized understanding            PT Short Term Goals - 03/28/20 1446      PT SHORT TERM GOAL #1   Title Patient will report no pain radiating into anterior thigh    Baseline no    Time 4    Period Weeks    Status Achieved    Target Date 12/09/19      PT SHORT TERM GOAL #2   Title Patient will increase bilateral LE strength to 4/5    Baseline 4+/5 gross    Time 4    Period Weeks    Status Achieved      PT SHORT TERM GOAL #3   Title Patient will increase passive right hip flexion to 105 degrrees    Baseline 100 today with pain onthe right side    Time 4    Period Weeks    Status On-going      PT SHORT TERM GOAL #4   Title Patient will increase right cervical rotation by 10 degrees    Baseline to 58 degres today    Time 4    Period Weeks    Status On-going    Target Date 01/09/20      PT SHORT TERM GOAL #5   Title Patient will increase ross bilateral UE strength to 4+/5    Baseline improved to 4/5    Time 4    Period Weeks    Status On-going             PT Long Term Goals - 04/30/20 1430      PT LONG TERM GOAL #1   Title Patient will stand for 30 min without self reported increase pain in order to stand at a kitchen sink.    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     Baseline pain at edn range hip flexion  and ir but intensity decreased    Time 6    Period Weeks    Status On-going      PT LONG TERM GOAL #4   Title Patient will reach behind his head without pain in order to perfrom ADL's    Baseline mild pain and tightness but improved    Time 6    Period Weeks    Status On-going      PT LONG TERM GOAL #5   Title Patient will report no radicular pain into her hands    Baseline more intermittent    Time 6    Period Weeks    Status On-going      PT LONG TERM GOAL #6   Title POatient will improve ABC score to 50% function in order to decrease her risk of fall    Baseline 40%    Period Weeks    Status On-going                 Plan - 06/11/20 1322    Clinical Impression Statement Patient tolerated treatment wel. She reported improved mobility in her back. Therapy needled lower lumbar spine and her gluteals. Therapy reviewed seated exercises gfor posture. She will not be able to swim for about 4 weeks so she was given some shoulder exercises for her HEP. Therapy willcc ontinue to work her towards more exercises as her pain from her recent flair resolves.    Personal Factors and Comorbidities Comorbidity 1;Comorbidity 2    Comorbidities MS, OA    Examination-Activity Limitations Locomotion Level;Carry;Stand;Lift;Stairs;Squat    Examination-Participation Restrictions Meal Prep;Cleaning;Community Activity;Laundry;Shop    Stability/Clinical Decision Making Stable/Uncomplicated    Clinical Decision Making Low    Rehab Potential Poor    PT Frequency 1x / week    PT Duration 8 weeks    PT Treatment/Interventions ADLs/Self Care Home Management;Electrical Stimulation;Cryotherapy;Iontophoresis 4mg /ml Dexamethasone;Moist Heat;Traction;DME Instruction;Neuromuscular re-education;Patient/family education;Manual techniques;Passive range of motion;Taping    PT Next Visit Plan work on balance with patient,    PT Home Exercise Plan LTR; hamstring stretch, tennis ball trigger point release ;  abdominal breathing;Access Code: [email or web address].Seated Upper Trapezius Stretch - 1 x daily - 7 x weekly - 1 sets - 3 reps - 20 hold.Gentle Levator Scapulae Stretch - 1 x daily - 7 x weekly - 1 sets - 3 reps - 20 hold.Shoulder extension with resistance - Neutral - 1 x daily - 7 x weekly - 3 sets - 10 reps.Scapular Retraction with Resistance - 1 x daily - 7 x weekly - 3 sets - 10 reps           Patient will benefit from skilled therapeutic intervention in order to improve the following deficits and impairments:  Abnormal gait, Difficulty walking, Decreased range of motion, Decreased mobility, Decreased strength, Postural dysfunction, Pain, Increased muscle spasms  Visit Diagnosis: Chronic bilateral low back pain without sciatica  Muscle weakness (generalized)  Difficulty in walking, not elsewhere classified  Cervicalgia  Cramp and spasm     Problem List Patient Active Problem List   Diagnosis Date Noted  . Sjogren's disease ([place]) 05/23/2020  . Primary osteoarthritis of both hands 05/23/2020  . Primary osteoarthritis of both  feet 05/23/2020  . Sacroiliitis, not elsewhere classified ([place]) 12/22/2019  . Other secondary scoliosis, lumbar region 12/22/2019  . Arthritis of facet joint of lumbar spine 12/22/2019  . Cervical radiculopathy 10/18/2019  . Numbness 09/21/2019  . Bilateral carpal tunnel syndrome 09/21/2019  . High risk medication use 09/21/2019  . Memory loss 09/21/2019  . Essential hypertension 08/25/2018  . Abnormal SPEP 02/19/2018  . Hand pain 02/20/2017  . Multiple joint pain 02/18/2017  . Right elbow pain 12/09/2016  . Disturbed cognition 06/25/2016  . Trochanteric bursitis of left hip 02/18/2016  . Sciatica, right side 10/30/2015  . Bilateral arm pain 10/10/2015  . Trochanteric bursitis of both hips 08/04/2014  . Chronic fatigue 08/04/2014  . Urinary frequency 08/04/2014  . Abnormality of gait 08/04/2014  . Unspecified visual disturbance 01/31/2013  .  Transient vision disturbance 01/31/2013  . Routine general medical examination at a health care facility 11/04/2011  . Colon polyps 11/03/2011  . Hot flashes, menopausal 10/13/2011  . POSTHERPETIC NEURALGIA 10/03/2009  . DISPLCMT LUMBAR INTERVERT [place] W/O MYELOPATHY 09/05/2009  . RENAL CALCULUS, RECURRENT 09/04/2009  . Hyperlipidemia 08/31/2009  . MULTIPLE SCLEROSIS, PROGRESSIVE/RELAPSING 08/31/2009  . ALLERGIC RHINITIS 08/31/2009    Carney Living PT DPT  06/11/2020, 1:41 PM  [place], [place], [address] Phone: [phone]   Fax:  [phone]  Name: Dawn [name] MRN: [id number] Date of Birth: 05-Aug-1945

## 2020-06-11 NOTE — Telephone Encounter (Signed)
I called a left a message on her VM for her to call back. Note from Dr. Mariea Clonts states - most readings are satisfactory. No med changes needed

## 2020-06-12 ENCOUNTER — Other Ambulatory Visit: Payer: Self-pay | Admitting: *Deleted

## 2020-06-12 DIAGNOSIS — I10 Essential (primary) hypertension: Secondary | ICD-10-CM

## 2020-06-12 MED ORDER — LOSARTAN POTASSIUM 50 MG PO TABS: 50.0000 mg | ORAL_TABLET | Freq: Every day | ORAL | 1 refills | Status: DC

## 2020-06-12 NOTE — Telephone Encounter (Signed)
Patient requested refill Pharmacy did not receive Rx for increase in Losartan.    Assessment/Plan 1. Essential hypertension -bps running up lately in 150s-170s at times which is too high -will take two 25mg  losartan for a couple of weeks and let me know if bp still too high - losartan (COZAAR) 50 MG tablet; Take 1 tablet (50 mg total) by mouth daily.  Dispense: 30 tablet; Refill: 3

## 2020-06-18 ENCOUNTER — Other Ambulatory Visit: Payer: Self-pay

## 2020-06-18 ENCOUNTER — Ambulatory Visit: Payer: Medicare Other | Admitting: Physical Therapy

## 2020-06-18 ENCOUNTER — Encounter: Payer: Self-pay | Admitting: Physical Therapy

## 2020-06-18 DIAGNOSIS — R262 Difficulty in walking, not elsewhere classified: Secondary | ICD-10-CM | POA: Diagnosis not present

## 2020-06-18 DIAGNOSIS — M542 Cervicalgia: Secondary | ICD-10-CM

## 2020-06-18 DIAGNOSIS — M6281 Muscle weakness (generalized): Secondary | ICD-10-CM | POA: Diagnosis not present

## 2020-06-18 DIAGNOSIS — G8929 Other chronic pain: Secondary | ICD-10-CM

## 2020-06-18 DIAGNOSIS — R252 Cramp and spasm: Secondary | ICD-10-CM | POA: Diagnosis not present

## 2020-06-18 DIAGNOSIS — M545 Low back pain, unspecified: Secondary | ICD-10-CM | POA: Diagnosis not present

## 2020-06-18 NOTE — Therapy (Signed)
[place], [place], [address] Phone: [phone]   Fax:  [phone]  Physical Therapy Treatment  Patient Details  Name: Dawn [name] MRN: [id number] Date of Birth: April 28, 1946 Referring Provider (PT): Dr Christell Constant   Encounter Date: 06/18/2020   PT End of Session - 06/18/20 1023    Visit Number 50    Number of Visits 14    Date for PT Re-Evaluation 07/09/20    Authorization Type progress note done on 51 th visit Next at 78    PT Start Time 1016    PT Stop Time 1100    PT Time Calculation (min) 44 min    Activity Tolerance Patient tolerated treatment well    Behavior During Therapy [facility] for tasks assessed/performed           Past Medical History:  Diagnosis Date  . Allergy   . Dry eyes   . Endometrial polyp   . Essential hypertension 08/25/2018  . GERD (gastroesophageal reflux disease)   . History of kidney stones   . Hot flashes, menopausal 10/13/2011   Estradiol started   . Jaundice as teenager   no problems since  . Memory loss 09/21/2019     1/2 of feet numb all the time  . Multiple sclerosis ([place]) dx 2001  . Neuropathy   . Osteoarthritis   . Sjogren's syndrome ([place]) dx oct 2021   sore muscvles, dry mouth and eyes  . Vision abnormalities     Past Surgical History:  Procedure Laterality Date  . ABDOMINAL HYSTERECTOMY  2002   partial  . colonscopy  2011  . DILATATION & CURETTAGE/HYSTEROSCOPY WITH MYOSURE N/A 06/08/2020   Procedure: DILATATION & CURETTAGE/HYSTEROSCOPY/Polypectomy WITH MYOSURE;  Surgeon: Armandina Stammer, DO;  Location: [facility];  Service: Gynecology;  Laterality: N/A;  . LUMBAR FUSION  2001  . UPPER GI ENDOSCOPY  yrs ago    There were no vitals filed for this visit.   Subjective Assessment - 06/18/20 1021    Subjective Patient feels like her back has been loosening up. She has been working on her postrual exercises.    Pertinent History MS     How long can you stand comfortably? hurts as soon as she stands. Has to shift frequently    How long can you walk comfortably? limited community ambualtion    Diagnostic tests Nothing recent    Currently in Pain? Yes    Pain Score 3     Pain Location Back    Pain Orientation Right;Left    Pain Descriptors / Indicators Aching    Pain Type Chronic pain    Pain Onset More than a month ago    Pain Frequency Constant    Aggravating Factors  standing and walking    Pain Relieving Factors rest and stretching    Effect of Pain on Daily Activities difficuly walking                             [facility] Adult PT Treatment/Exercise - 06/18/20 0001      Neck Exercises: Seated   Other Seated Exercise bilateral er 2x10 red; horzontal abduction red 2x10; ewand flexion 2x10; d2 flexion red in very limited range      Lumbar Exercises: Stretches   Lower Trunk Rotation Limitations x20    Piriformis Stretch Limitations 3x20 sec holdleft only today      Lumbar Exercises:  Standing   Heel Raises 20 reps      Lumbar Exercises: Seated   Other Seated Lumbar Exercises LAQ red 2x10; hamstring curl 2x10 seated clamshell x20 red     Other Seated Lumbar Exercises seated clamshell with band 2x10 red       Manual Therapy   Manual Therapy Soft tissue mobilization    Manual therapy comments skilled palpation of trigger points     Joint Mobilization Long axis distraction    Soft tissue mobilization soft tissue mobilization to bilateral lumbar spine and bilateral gluteals    Passive ROM focus on left hip hip flexionIR/ER     Manual Traction LAD Grade II-III            Trigger Point Dry Needling - 06/18/20 0001    Consent Given? Yes    Other Dry Needling 2 spots on the left L4-l5 AREA AND 2 SPOTS INT HE GLUTEAL BILATERAL USING A .30X50 NEEDLE      Gluteus Medius Response Twitch response elicited    Lumbar multifidi Response Twitch response elicited;Palpable increased muscle length                 PT Education - 06/18/20 1023    Education Details HEP and symptom mangement    Person(s) Educated Patient    Methods Explanation;Demonstration;Tactile cues;Verbal cues    Comprehension Verbalized understanding;Returned demonstration;Verbal cues required;Tactile cues required            PT Short Term Goals - 03/28/20 1446      PT SHORT TERM GOAL #1   Title Patient will report no pain radiating into anterior thigh    Baseline no    Time 4    Period Weeks    Status Achieved    Target Date 12/09/19      PT SHORT TERM GOAL #2   Title Patient will increase bilateral LE strength to 4/5    Baseline 4+/5 gross    Time 4    Period Weeks    Status Achieved      PT SHORT TERM GOAL #3   Title Patient will increase passive right hip flexion to 105 degrrees    Baseline 100 today with pain onthe right side    Time 4    Period Weeks    Status On-going      PT SHORT TERM GOAL #4   Title Patient will increase right cervical rotation by 10 degrees    Baseline to 58 degres today    Time 4    Period Weeks    Status On-going    Target Date 01/09/20      PT SHORT TERM GOAL #5   Title Patient will increase ross bilateral UE strength to 4+/5    Baseline improved to 4/5    Time 4    Period Weeks    Status On-going             PT Long Term Goals - 04/30/20 1430      PT LONG TERM GOAL #1   Title Patient will stand for 30 min without self reported increase pain in order to stand at a kitchen sink.    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     Baseline pain at edn range hip  flexion and ir but intensity decreased    Time 6    Period Weeks    Status On-going      PT LONG TERM GOAL #4   Title Patient will reach behind his head without pain in order to perfrom ADL's    Baseline mild pain and tightness but improved    Time 6    Period Weeks    Status On-going      PT LONG TERM GOAL #5   Title Patient will report no radicular pain into her hands    Baseline more intermittent    Time 6    Period Weeks    Status On-going      PT LONG TERM GOAL #6   Title POatient will improve ABC score to 50% function in order to decrease her risk of fall    Baseline 40%    Period Weeks    Status On-going                 Plan - 06/18/20 1023    Clinical Impression Statement Patient continues to make ggood progress. She had less trightness in her lower back today. Therapy fosucsed on manual therapy and reviewed postural exercsies.    Personal Factors and Comorbidities Comorbidity 1;Comorbidity 2    Comorbidities MS, OA    Examination-Activity Limitations Locomotion Level;Carry;Stand;Lift;Stairs;Squat    Examination-Participation Restrictions Meal Prep;Cleaning;Community Activity;Laundry;Shop    Stability/Clinical Decision Making Stable/Uncomplicated    Clinical Decision Making Low    Rehab Potential Poor    PT Frequency 1x / week    PT Duration 8 weeks    PT Treatment/Interventions ADLs/Self Care Home Management;Electrical Stimulation;Cryotherapy;Iontophoresis 4mg /ml Dexamethasone;Moist Heat;Traction;DME Instruction;Neuromuscular re-education;Patient/family education;Manual techniques;Passive range of motion;Taping    PT Next Visit Plan work on balance with patient,    PT Home Exercise Plan LTR; hamstring stretch, tennis ball trigger point release ; abdominal breathing;Access Code: [email or web address].Seated Upper Trapezius Stretch - 1 x daily - 7 x weekly - 1 sets - 3 reps - 20 hold.Gentle Levator Scapulae Stretch - 1 x daily - 7 x weekly - 1 sets - 3 reps - 20  hold.Shoulder extension with resistance - Neutral - 1 x daily - 7 x weekly - 3 sets - 10 reps.Scapular Retraction with Resistance - 1 x daily - 7 x weekly - 3 sets - 10 reps    Consulted and Agree with Plan of Care Patient           Patient will benefit from skilled therapeutic intervention in order to improve the following deficits and impairments:  Abnormal gait, Difficulty walking, Decreased range of motion, Decreased mobility, Decreased strength, Postural dysfunction, Pain, Increased muscle spasms  Visit Diagnosis: Chronic bilateral low back pain without sciatica  Muscle weakness (generalized)  Difficulty in walking, not elsewhere classified  Cervicalgia  Cramp and spasm     Problem List Patient Active Problem List   Diagnosis Date Noted  . Sjogren's disease ([place]) 05/23/2020  . Primary osteoarthritis of both hands 05/23/2020  . Primary osteoarthritis of both feet 05/23/2020  . Sacroiliitis, not elsewhere classified ([place]) 12/22/2019  . Other secondary scoliosis,  lumbar region 12/22/2019  . Arthritis of facet joint of lumbar spine 12/22/2019  . Cervical radiculopathy 10/18/2019  . Numbness 09/21/2019  . Bilateral carpal tunnel syndrome 09/21/2019  . High risk medication use 09/21/2019  . Memory loss 09/21/2019  . Essential hypertension 08/25/2018  . Abnormal SPEP 02/19/2018  . Hand pain 02/20/2017  . Multiple joint pain 02/18/2017  . Right elbow pain 12/09/2016  . Disturbed cognition 06/25/2016  . Trochanteric bursitis of left hip 02/18/2016  . Sciatica, right side 10/30/2015  . Bilateral arm pain 10/10/2015  . Trochanteric bursitis of both hips 08/04/2014  . Chronic fatigue 08/04/2014  . Urinary frequency 08/04/2014  . Abnormality of gait 08/04/2014  . Unspecified visual disturbance 01/31/2013  . Transient vision disturbance 01/31/2013  . Routine general medical examination at a health care facility 11/04/2011  . Colon polyps 11/03/2011  . Hot flashes,  menopausal 10/13/2011  . POSTHERPETIC NEURALGIA 10/03/2009  . DISPLCMT LUMBAR INTERVERT [place] W/O MYELOPATHY 09/05/2009  . RENAL CALCULUS, RECURRENT 09/04/2009  . Hyperlipidemia 08/31/2009  . MULTIPLE SCLEROSIS, PROGRESSIVE/RELAPSING 08/31/2009  . ALLERGIC RHINITIS 08/31/2009    Carney Living PT DPT  06/18/2020, 11:16 AM  [facility] [address] [place], [place], [address] Phone: [phone]   Fax:  [phone]  Name: Dawn [name] MRN: [id number] Date of Birth: [birth year]

## 2020-06-19 ENCOUNTER — Ambulatory Visit (INDEPENDENT_AMBULATORY_CARE_PROVIDER_SITE_OTHER): Payer: Medicare Other | Admitting: Physical Medicine and Rehabilitation

## 2020-06-19 ENCOUNTER — Encounter: Payer: Self-pay | Admitting: Physical Medicine and Rehabilitation

## 2020-06-19 VITALS — BP 162/77 | HR 74

## 2020-06-19 DIAGNOSIS — M47816 Spondylosis without myelopathy or radiculopathy, lumbar region: Secondary | ICD-10-CM

## 2020-06-19 DIAGNOSIS — G894 Chronic pain syndrome: Secondary | ICD-10-CM | POA: Diagnosis not present

## 2020-06-19 DIAGNOSIS — G8929 Other chronic pain: Secondary | ICD-10-CM | POA: Diagnosis not present

## 2020-06-19 DIAGNOSIS — M545 Low back pain, unspecified: Secondary | ICD-10-CM

## 2020-06-19 DIAGNOSIS — M961 Postlaminectomy syndrome, not elsewhere classified: Secondary | ICD-10-CM | POA: Diagnosis not present

## 2020-06-19 NOTE — Progress Notes (Signed)
Low back and bilateral lateral hip pain. History of facet/ MBB x2. No pain radiating into legs. Numeric Pain Rating Scale and Functional Assessment Average Pain 5 Pain Right Now 3 My pain is constant, dull and aching Pain is worse with: walking, sitting, standing and some activites Pain improves with: rest, heat/ice, therapy/exercise and medication   In the last MONTH (on 0-10 scale) has pain interfered with the following?  1. General activity like being  able to carry out your everyday physical activities such as walking, climbing stairs, carrying groceries, or moving a chair?  Rating(8)  2. Relation with others like being able to carry out your usual social activities and roles such as  activities at home, at work and in your community. Rating(8)  3. Enjoyment of life such that you have  been bothered by emotional problems such as feeling anxious, depressed or irritable?  Rating(0)

## 2020-06-20 ENCOUNTER — Encounter: Payer: Self-pay | Admitting: Physical Medicine and Rehabilitation

## 2020-06-20 DIAGNOSIS — M961 Postlaminectomy syndrome, not elsewhere classified: Secondary | ICD-10-CM | POA: Insufficient documentation

## 2020-06-20 DIAGNOSIS — G894 Chronic pain syndrome: Secondary | ICD-10-CM | POA: Insufficient documentation

## 2020-06-20 NOTE — Progress Notes (Signed)
Dawn [name] - 74 y.o. female MRN [id number]  Date of birth: 04-09-1946  Office Visit Note: Visit Date: 06/19/2020 PCP: Gayland Curry, DO Referred by: Gayland Curry, DO  Subjective: Chief Complaint  Patient presents with  . Lower Back - Pain   HPI: Dawn [name] is a 74 y.o. female who comes in today For evaluation management of chronic history of low back pain status post remote lumbar surgery.  MRI from 2019 showing mainly degenerative changes throughout with multilevel facet arthropathy worse at L3-4 and L4-5.  She is status post 2 medial branch blocks in a double block paradigm with a goal towards radiofrequency ablation of those joints.  Her case is complicated by multiple sclerosis as well as chronic pain syndrome and multiple joint arthritis.  Today she reports continued back pain rated as a 5 out of 10 which is constant dull and aching worse with walking and sitting and going from sit to stand.  She reports no leg pain no new features.  She reports not wanting to have the radiofrequency ablation performed.  She reports that she thought about the situation and just does not want to have that done.  She did want her MRI and imaging CDs returned to her which we did give her today.  She will continue with ongoing normal standard of care that she has been doing with activity to toleration.  She can return to see [place] when needed.  Review of Systems  Constitutional: Positive for malaise/fatigue.  Musculoskeletal: Positive for back pain.  All other systems reviewed and are negative.  Otherwise per HPI.  Assessment & Plan: Visit Diagnoses:    ICD-10-CM   1. Spondylosis without myelopathy or radiculopathy, lumbar region  M47.816   2. Chronic bilateral low back pain without sciatica  M54.50    G89.29   3. Chronic pain syndrome  G89.4   4. Post laminectomy syndrome  M96.1      Plan: Findings:  See HPI    Meds & Orders: No orders of the defined types were placed in this  encounter.  No orders of the defined types were placed in this encounter.   Follow-up: Return if symptoms worsen or fail to improve.   Procedures: No procedures performed  No notes on file   Clinical History: Acute Interface, Incoming Rad Results - 11/10/2017 2:17 PM EDT  TECHNIQUE: Multiplanar, multisequence MR imaging obtained through the lumbar spine without contrast.   COMPARISON: None.   INDICATION: Lumbago with sciatica, left side   FINDINGS:  Osseous structures: Normal marrow signal. No fracture or vertebral body height loss. In the L1 vertebral body there is a 15 mm lesion that is mildly hypointense the marrow T1/T2 and isointense on STIR. No pars defects. Previous surgery in the lower  lumbar spine. No large bony defects.  Alignment: 1-2 mm retrolisthesis of L2 on L3 and L3 and L4.  Conus medullaris/cauda equina: Normal in appearance.  Paraspinous soft tissues: Unremarkable. Small amount of old surgical scarring/artifact noted at the L4-5 level.  Lower thoracic spine: Unremarkable.    Levels:  T12-L1: No significant stenosis.  L1-L2: No significant stenosis.  L2-L3: Minimal retrolisthesis. Small broad-based posterior disc bulge, ligamentum flavum thickening and trace facet joint fluid. Mild canal stenosis and mild bilateral foraminal stenosis.  L3-L4: Minimal retrolisthesis. Small broad-based posterior disc bulge and annular tear. Ligamentum flavum thickening. Mild canal stenosis. Moderate right and mild left foraminal stenosis.  L4-L5: Small broad-based posterior disc bulge with thickening  of the ligamentum flavum. The canal is patent. There is mild foraminal stenosis on the right greater than left.  L5-S1: Small broad-based posterior disc bulge with right foraminal component. Mild facet arthrosis. Mild right foraminal stenosis.    IMPRESSION:  1. Multilevel lumbar disc degenerative change and facet arthrosis as detailed. There is mild canal stenosis at L2-3 and L3-4.  There is moderate right foraminal stenosis at L3-4 with otherwisemild foraminal stenoses.  2. Lesion of the L1 vertebral body. This is most likely a benign hemangioma with atypical signal however strictly cannot exclude other etiology including myeloma. Correlate with pertinent labs and any available prior imaging.Otherwise consider a 6  month follow-up to assure stability.   Electronically Signed by: Tonita Phoenix Specimen Collected: - Date: 11/10/17 Received From: [facility]   She reports that she quit smoking about 36 years ago. Her smoking use included cigarettes. She has a 7.50 pack-year smoking history. She has never used smokeless tobacco. No results for input(s): HGBA1C, LABURIC in the last 8760 hours.  Objective:  VS:  HT:    WT:   BMI:     BP:(!) 162/77  HR:74bpm  TEMP: ( )  RESP:  Physical Exam Vitals reviewed: Patient slow to rise from a seated position ambulates with a cane.     Ortho Exam  Imaging: No results found.  Past Medical/Family/Surgical/Social History: Medications & Allergies reviewed per EMR, new medications updated. Patient Active Problem List   Diagnosis Date Noted  . Chronic pain syndrome 06/20/2020  . Post laminectomy syndrome 06/20/2020  . Sjogren's disease ([place]) 05/23/2020  . Primary osteoarthritis of both hands 05/23/2020  . Primary osteoarthritis of both feet 05/23/2020  . Sacroiliitis, not elsewhere classified ([place]) 12/22/2019  . Other secondary scoliosis, lumbar region 12/22/2019  . Spondylosis without myelopathy or radiculopathy, lumbar region 12/22/2019  . Cervical radiculopathy 10/18/2019  . Numbness 09/21/2019  . Bilateral carpal tunnel syndrome 09/21/2019  . High risk medication use 09/21/2019  . Memory loss 09/21/2019  . Essential hypertension 08/25/2018  . Abnormal SPEP 02/19/2018  . Hand pain 02/20/2017  . Multiple joint pain 02/18/2017  . Right elbow pain 12/09/2016  . Disturbed cognition 06/25/2016  . Trochanteric  bursitis of left hip 02/18/2016  . Sciatica, right side 10/30/2015  . Bilateral arm pain 10/10/2015  . Trochanteric bursitis of both hips 08/04/2014  . Chronic fatigue 08/04/2014  . Urinary frequency 08/04/2014  . Abnormality of gait 08/04/2014  . Unspecified visual disturbance 01/31/2013  . Transient vision disturbance 01/31/2013  . Routine general medical examination at a health care facility 11/04/2011  . Colon polyps 11/03/2011  . Hot flashes, menopausal 10/13/2011  . POSTHERPETIC NEURALGIA 10/03/2009  . DISPLCMT LUMBAR INTERVERT [place] W/O MYELOPATHY 09/05/2009  . RENAL CALCULUS, RECURRENT 09/04/2009  . Hyperlipidemia 08/31/2009  . MULTIPLE SCLEROSIS, PROGRESSIVE/RELAPSING 08/31/2009  . ALLERGIC RHINITIS 08/31/2009   Past Medical History:  Diagnosis Date  . Allergy   . Dry eyes   . Endometrial polyp   . Essential hypertension 08/25/2018  . GERD (gastroesophageal reflux disease)   . History of kidney stones   . Hot flashes, menopausal 10/13/2011   Estradiol started   . Jaundice as teenager   no problems since  . Memory loss 09/21/2019     1/2 of feet numb all the time  . Multiple sclerosis ([place]) dx 2001  . Neuropathy   . Osteoarthritis   . Sjogren's syndrome ([place]) dx oct 2021   sore muscvles, dry mouth and eyes  .  Vision abnormalities    Family History  Problem Relation Age of Onset  . Heart failure Mother   . Heart failure Father   . Arthritis Other   . Hypertension Other    Past Surgical History:  Procedure Laterality Date  . ABDOMINAL HYSTERECTOMY  2002   partial  . colonscopy  2011  . DILATATION & CURETTAGE/HYSTEROSCOPY WITH MYOSURE N/A 06/08/2020   Procedure: DILATATION & CURETTAGE/HYSTEROSCOPY/Polypectomy WITH MYOSURE;  Surgeon: Armandina Stammer, DO;  Location: [facility];  Service: Gynecology;  Laterality: N/A;  . LUMBAR FUSION  2001  . UPPER GI ENDOSCOPY  yrs ago   Social History   Occupational History  . Occupation: retired  Tobacco  Use  . Smoking status: Former Smoker    Packs/day: 0.50    Years: 15.00    Pack years: 7.50    Types: Cigarettes    Quit date: 08/05/1983    Years since quitting: 36.9  . Smokeless tobacco: Never Used  Vaping Use  . Vaping Use: Never used  Substance and Sexual Activity  . Alcohol use: Yes    Alcohol/week: 0.0 standard drinks    Comment: rarely  . Drug use: No  . Sexual activity: Not Currently    Birth control/protection: Post-menopausal

## 2020-06-25 ENCOUNTER — Other Ambulatory Visit: Payer: Self-pay

## 2020-06-25 ENCOUNTER — Encounter: Payer: Self-pay | Admitting: Physical Therapy

## 2020-06-25 ENCOUNTER — Ambulatory Visit: Payer: Medicare Other | Attending: Neurology | Admitting: Physical Therapy

## 2020-06-25 DIAGNOSIS — M542 Cervicalgia: Secondary | ICD-10-CM | POA: Diagnosis not present

## 2020-06-25 DIAGNOSIS — M6281 Muscle weakness (generalized): Secondary | ICD-10-CM | POA: Insufficient documentation

## 2020-06-25 DIAGNOSIS — R252 Cramp and spasm: Secondary | ICD-10-CM | POA: Diagnosis not present

## 2020-06-25 DIAGNOSIS — M545 Low back pain, unspecified: Secondary | ICD-10-CM | POA: Insufficient documentation

## 2020-06-25 DIAGNOSIS — G8929 Other chronic pain: Secondary | ICD-10-CM | POA: Diagnosis not present

## 2020-06-25 DIAGNOSIS — R262 Difficulty in walking, not elsewhere classified: Secondary | ICD-10-CM

## 2020-06-25 NOTE — Therapy (Signed)
[place], [place], [address] Phone: [phone]   Fax:  [phone]  Physical Therapy Treatment  Patient Details  Name: Dawn [name] MRN: [id number] Date of Birth: 19-Oct-1945 Referring Provider (PT): Dr Christell Constant   Encounter Date: 06/25/2020   PT End of Session - 06/25/20 1035    Visit Number 51    Number of Visits 53    Date for PT Re-Evaluation 07/09/20    Authorization Type progress note done on 32 th visit Next at 78    PT Start Time 1015    PT Stop Time 1056    PT Time Calculation (min) 41 min    Activity Tolerance Patient tolerated treatment well    Behavior During Therapy [facility] for tasks assessed/performed           Past Medical History:  Diagnosis Date  . Allergy   . Dry eyes   . Endometrial polyp   . Essential hypertension 08/25/2018  . GERD (gastroesophageal reflux disease)   . History of kidney stones   . Hot flashes, menopausal 10/13/2011   Estradiol started   . Jaundice as teenager   no problems since  . Memory loss 09/21/2019     1/2 of feet numb all the time  . Multiple sclerosis ([place]) dx 2001  . Neuropathy   . Osteoarthritis   . Sjogren's syndrome ([place]) dx oct 2021   sore muscvles, dry mouth and eyes  . Vision abnormalities     Past Surgical History:  Procedure Laterality Date  . ABDOMINAL HYSTERECTOMY  2002   partial  . colonscopy  2011  . DILATATION & CURETTAGE/HYSTEROSCOPY WITH MYOSURE N/A 06/08/2020   Procedure: DILATATION & CURETTAGE/HYSTEROSCOPY/Polypectomy WITH MYOSURE;  Surgeon: Armandina Stammer, DO;  Location: [place];  Service: Gynecology;  Laterality: N/A;  . LUMBAR FUSION  2001  . UPPER GI ENDOSCOPY  yrs ago    There were no vitals filed for this visit.   Subjective Assessment - 06/25/20 1021    Subjective Patient has been to the MD. She is not going to have the abilation at this time. She has had lateral leg soreness this week  on the right side. Patient is having difficulty lying supine.    Pertinent History MS    How long can you stand comfortably? hurts as soon as she stands. Has to shift frequently    How long can you walk comfortably? limited community ambualtion    Diagnostic tests Nothing recent    Currently in Pain? Yes    Pain Score 6     Pain Location Hip    Pain Orientation Right    Pain Descriptors / Indicators Aching    Pain Type Chronic pain    Pain Radiating Towards down lateral leg    Pain Onset More than a month ago    Pain Frequency Constant    Aggravating Factors  standing and walking    Pain Relieving Factors rest and stretching    Effect of Pain on Daily Activities difficulty walking                             [facility] Adult PT Treatment/Exercise - 06/25/20 0001      Lumbar Exercises: Stretches   Lower Trunk Rotation Limitations x20    Piriformis Stretch Limitations 3x20 sec holdleft only today      Lumbar Exercises: Supine   Other  Supine Lumbar Exercises supine clamshell x15 yellow       Manual Therapy   Manual Therapy Soft tissue mobilization    Manual therapy comments skilled palpation of trigger points     Joint Mobilization Long axis distraction    Soft tissue mobilization soft tissue mobilization to lateral hips with rollwer     Passive ROM focus on left hip hip flexionIR/ER     Manual Traction LAD Grade II-III            Trigger Point Dry Needling - 06/25/20 0001    Consent Given? Yes    Other Dry Needling 2 spots on the left L4-l5 AREA AND 2 SPOTS INT HE GLUTEAL BILATERAL USING A .30X50 NEEDLE      Piriformis Response Twitch response elicited;Palpable increased muscle length                PT Education - 06/25/20 1025    Education Details reviewed HEP and symptom mangement    Person(s) Educated Patient    Methods Explanation;Demonstration;Tactile cues;Verbal cues    Comprehension Verbalized understanding;Returned demonstration;Verbal cues  required;Tactile cues required            PT Short Term Goals - 03/28/20 1446      PT SHORT TERM GOAL #1   Title Patient will report no pain radiating into anterior thigh    Baseline no    Time 4    Period Weeks    Status Achieved    Target Date 12/09/19      PT SHORT TERM GOAL #2   Title Patient will increase bilateral LE strength to 4/5    Baseline 4+/5 gross    Time 4    Period Weeks    Status Achieved      PT SHORT TERM GOAL #3   Title Patient will increase passive right hip flexion to 105 degrrees    Baseline 100 today with pain onthe right side    Time 4    Period Weeks    Status On-going      PT SHORT TERM GOAL #4   Title Patient will increase right cervical rotation by 10 degrees    Baseline to 58 degres today    Time 4    Period Weeks    Status On-going    Target Date 01/09/20      PT SHORT TERM GOAL #5   Title Patient will increase ross bilateral UE strength to 4+/5    Baseline improved to 4/5    Time 4    Period Weeks    Status On-going             PT Long Term Goals - 04/30/20 1430      PT LONG TERM GOAL #1   Title Patient will stand for 30 min without self reported increase pain in order to stand at a kitchen sink.    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     Baseline pain at edn range hip flexion and ir but intensity decreased    Time 6    Period Weeks    Status On-going      PT LONG TERM GOAL #4   Title Patient will reach behind his head without pain in order to perfrom ADL's    Baseline mild pain and tightness but improved    Time 6     Period Weeks    Status On-going      PT LONG TERM GOAL #5   Title Patient will report no radicular pain into her hands    Baseline more intermittent    Time 6    Period Weeks    Status On-going      PT LONG TERM GOAL #6   Title POatient will improve ABC score to 50% function in order to decrease her risk of fall    Baseline 40%    Period Weeks    Status On-going                 Plan - 06/25/20 1425    Clinical Impression Statement Patient was able to lie supine at the end of the visit and had less pain in standing. She will resume swimming this week. Therapy reviewed light strentching an dstrengthening to perfrom at home. Therapy needled her piriformis and worked on roller to bilateral hips.    Personal Factors and Comorbidities Comorbidity 1;Comorbidity 2    Comorbidities MS, OA    Examination-Activity Limitations Locomotion Level;Carry;Stand;Lift;Stairs;Squat    Examination-Participation Restrictions Meal Prep;Cleaning;Community Activity;Laundry;Shop    Stability/Clinical Decision Making Stable/Uncomplicated    Clinical Decision Making Low    Rehab Potential Poor    PT Frequency 1x / week    PT Duration 8 weeks    PT Treatment/Interventions ADLs/Self Care Home Management;Electrical Stimulation;Cryotherapy;Iontophoresis 4mg /ml Dexamethasone;Moist Heat;Traction;DME Instruction;Neuromuscular re-education;Patient/family education;Manual techniques;Passive range of motion;Taping    PT Next Visit Plan work on balance with patient,    PT Home Exercise Plan LTR; hamstring stretch, tennis ball trigger point release ; abdominal breathing;Access Code: [email or web address].Seated Upper Trapezius Stretch - 1 x daily - 7 x weekly - 1 sets - 3 reps - 20 hold.Gentle Levator Scapulae Stretch - 1 x daily - 7 x weekly - 1 sets - 3 reps - 20 hold.Shoulder extension with resistance - Neutral - 1 x daily - 7 x weekly - 3 sets - 10 reps.Scapular Retraction with Resistance - 1 x daily - 7 x weekly - 3  sets - 10 reps           Patient will benefit from skilled therapeutic intervention in order to improve the following deficits and impairments:  Abnormal gait, Difficulty walking, Decreased range of motion, Decreased mobility, Decreased strength, Postural dysfunction, Pain, Increased muscle spasms  Visit Diagnosis: Chronic bilateral low back pain without sciatica  Muscle weakness (generalized)  Difficulty in walking, not elsewhere classified  Cervicalgia  Cramp and spasm     Problem List Patient Active Problem List   Diagnosis Date Noted  . Chronic pain syndrome 06/20/2020  . Post laminectomy syndrome 06/20/2020  . Sjogren's disease ([place]) 05/23/2020  . Primary osteoarthritis of both hands 05/23/2020  . Primary osteoarthritis of both feet 05/23/2020  . Sacroiliitis, not elsewhere classified ([place]) 12/22/2019  . Other secondary scoliosis, lumbar region 12/22/2019  . Spondylosis without myelopathy or radiculopathy, lumbar region 12/22/2019  . Cervical radiculopathy 10/18/2019  . Numbness 09/21/2019  . Bilateral carpal  tunnel syndrome 09/21/2019  . High risk medication use 09/21/2019  . Memory loss 09/21/2019  . Essential hypertension 08/25/2018  . Abnormal SPEP 02/19/2018  . Hand pain 02/20/2017  . Multiple joint pain 02/18/2017  . Right elbow pain 12/09/2016  . Disturbed cognition 06/25/2016  . Trochanteric bursitis of left hip 02/18/2016  . Sciatica, right side 10/30/2015  . Bilateral arm pain 10/10/2015  . Trochanteric bursitis of both hips 08/04/2014  . Chronic fatigue 08/04/2014  . Urinary frequency 08/04/2014  . Abnormality of gait 08/04/2014  . Unspecified visual disturbance 01/31/2013  . Transient vision disturbance 01/31/2013  . Routine general medical examination at a health care facility 11/04/2011  . Colon polyps 11/03/2011  . Hot flashes, menopausal 10/13/2011  . POSTHERPETIC NEURALGIA 10/03/2009  . DISPLCMT LUMBAR INTERVERT [place] W/O MYELOPATHY  09/05/2009  . RENAL CALCULUS, RECURRENT 09/04/2009  . Hyperlipidemia 08/31/2009  . MULTIPLE SCLEROSIS, PROGRESSIVE/RELAPSING 08/31/2009  . ALLERGIC RHINITIS 08/31/2009    Carney Living 06/25/2020, 2:30 PM  [place], [place], [address] Phone: [phone]   Fax:  [phone]  Name: Dawn [name] MRN: [id number] Date of Birth: 07/01/1946

## 2020-07-05 ENCOUNTER — Encounter: Payer: Self-pay | Admitting: Physical Therapy

## 2020-07-05 ENCOUNTER — Other Ambulatory Visit: Payer: Self-pay

## 2020-07-05 ENCOUNTER — Ambulatory Visit: Payer: Medicare Other | Admitting: Physical Therapy

## 2020-07-05 DIAGNOSIS — M545 Low back pain, unspecified: Secondary | ICD-10-CM

## 2020-07-05 DIAGNOSIS — R252 Cramp and spasm: Secondary | ICD-10-CM

## 2020-07-05 DIAGNOSIS — M6281 Muscle weakness (generalized): Secondary | ICD-10-CM | POA: Diagnosis not present

## 2020-07-05 DIAGNOSIS — R262 Difficulty in walking, not elsewhere classified: Secondary | ICD-10-CM

## 2020-07-05 DIAGNOSIS — M542 Cervicalgia: Secondary | ICD-10-CM | POA: Diagnosis not present

## 2020-07-05 DIAGNOSIS — G8929 Other chronic pain: Secondary | ICD-10-CM | POA: Diagnosis not present

## 2020-07-06 ENCOUNTER — Encounter: Payer: Self-pay | Admitting: Physical Therapy

## 2020-07-06 NOTE — Therapy (Signed)
[place], [place], [address] Phone: [phone]   Fax:  [phone]  Physical Therapy Treatment  Patient Details  Name: Dawn [name] MRN: [id number] Date of Birth: Feb 07, 1946 Referring Provider (PT): Dr Christell Constant   Encounter Date: 07/05/2020    PT End of Session - 07/06/20 1210    Visit Number 52    Number of Visits 53    Date for PT Re-Evaluation 07/09/20    PT Start Time 1102    PT Stop Time 1143    PT Time Calculation (min) 41 min    Activity Tolerance Patient tolerated treatment well    Behavior During Therapy [facility] for tasks assessed/performed           Past Medical History:  Diagnosis Date  . Allergy   . Dry eyes   . Endometrial polyp   . Essential hypertension 08/25/2018  . GERD (gastroesophageal reflux disease)   . History of kidney stones   . Hot flashes, menopausal 10/13/2011   Estradiol started   . Jaundice as teenager   no problems since  . Memory loss 09/21/2019     1/2 of feet numb all the time  . Multiple sclerosis ([place]) dx 2001  . Neuropathy   . Osteoarthritis   . Sjogren's syndrome ([place]) dx oct 2021   sore muscvles, dry mouth and eyes  . Vision abnormalities     Past Surgical History:  Procedure Laterality Date  . ABDOMINAL HYSTERECTOMY  2002   partial  . colonscopy  2011  . DILATATION & CURETTAGE/HYSTEROSCOPY WITH MYOSURE N/A 06/08/2020   Procedure: DILATATION & CURETTAGE/HYSTEROSCOPY/Polypectomy WITH MYOSURE;  Surgeon: Armandina Stammer, DO;  Location: [place];  Service: Gynecology;  Laterality: N/A;  . LUMBAR FUSION  2001  . UPPER GI ENDOSCOPY  yrs ago    There were no vitals filed for this visit.   Subjective Assessment - 07/06/20 1207    Subjective Patients reports her right hip continues to be very painful. She is using a cane which has helped. She is not sure it is fit right. We will assess her cane use. She feels like maybe her GI  procedure placement could have played a roll.    Pertinent History MS    How long can you stand comfortably? hurts as soon as she stands. Has to shift frequently    How long can you walk comfortably? limited community ambualtion    Diagnostic tests Nothing recent    Currently in Pain? Yes    Pain Score 6     Pain Location Hip    Pain Orientation Right    Pain Descriptors / Indicators Aching    Pain Type Chronic pain    Pain Onset More than a month ago    Pain Frequency Constant    Aggravating Factors  standing and walking    Pain Relieving Factors rest and stretching    Effect of Pain on Daily Activities difficulty walking    Multiple Pain Sites No                             [facility] Adult PT Treatment/Exercise - 07/06/20 0001      Ambulation/Gait   Gait Comments patientscane was 2 spots too low causing her fto flex forward. She was also showed how to ambualte while shifting weight off her right leg. She reported imporved pain with adjustment. patient ambualted  300' with SPC with proper fit and gait pattern.      Neck Exercises: Supine   Other Supine Exercise gentle supine clamshell yellow 2x10      Lumbar Exercises: Stretches   Lower Trunk Rotation Limitations x20    Piriformis Stretch Limitations 3x20 sec holdleft only today      Manual Therapy   Manual Therapy Soft tissue mobilization    Manual therapy comments skilled palpation of trigger points     Joint Mobilization Long axis distraction    Soft tissue mobilization soft tissue mobilization to lateral hips with rollwer     Passive ROM focus on left hip hip flexionIR/ER     Manual Traction LAD Grade II-III            Trigger Point Dry Needling - 07/06/20 0001    Consent Given? Yes    Other Dry Needling 3 spots in piriformis 2 spots in right gluteal    Gluteus Medius Response Twitch response elicited    Piriformis Response Twitch response elicited;Palpable increased muscle length                 PT Education - 07/06/20 1209    Education Details HEP and symptom mangement    Person(s) Educated Patient    Methods Explanation;Demonstration;Tactile cues;Verbal cues    Comprehension Verbalized understanding;Returned demonstration;Verbal cues required;Tactile cues required            PT Short Term Goals - 03/28/20 1446      PT SHORT TERM GOAL #1   Title Patient will report no pain radiating into anterior thigh    Baseline no    Time 4    Period Weeks    Status Achieved    Target Date 12/09/19      PT SHORT TERM GOAL #2   Title Patient will increase bilateral LE strength to 4/5    Baseline 4+/5 gross    Time 4    Period Weeks    Status Achieved      PT SHORT TERM GOAL #3   Title Patient will increase passive right hip flexion to 105 degrrees    Baseline 100 today with pain onthe right side    Time 4    Period Weeks    Status On-going      PT SHORT TERM GOAL #4   Title Patient will increase right cervical rotation by 10 degrees    Baseline to 58 degres today    Time 4    Period Weeks    Status On-going    Target Date 01/09/20      PT SHORT TERM GOAL #5   Title Patient will increase ross bilateral UE strength to 4+/5    Baseline improved to 4/5    Time 4    Period Weeks    Status On-going             PT Long Term Goals - 04/30/20 1430      PT LONG TERM GOAL #1   Title Patient will stand for 30 min without self reported increase pain in order to stand at a kitchen sink.    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     Baseline pain at edn range hip  flexion and ir but intensity decreased    Time 6    Period Weeks    Status On-going      PT LONG TERM GOAL #4   Title Patient will reach behind his head without pain in order to perfrom ADL's    Baseline mild pain and tightness but improved    Time 6    Period Weeks    Status On-going      PT LONG TERM GOAL #5   Title Patient will report no radicular pain into her hands    Baseline more intermittent    Time 6    Period Weeks    Status On-going      PT LONG TERM GOAL #6   Title POatient will improve ABC score to 50% function in order to decrease her risk of fall    Baseline 40%    Period Weeks    Status On-going                 Plan - 07/06/20 1210    Clinical Impression Statement Therapy needled her deep rotators and her gluteal. She had improved motion following needling and oft tissue mobilization. Therapy reviewed use of her cane. Her cane was low. She was also not using the proper pattern. Both were easily corrected and improved lateral motion with gait. Therapy reviewed light ther-ex and stretching to continue with at home. We hope to progress back to balance and strengthening exercises if we can decrease her right hip pain more. We will re-assess next visit.    Personal Factors and Comorbidities Comorbidity 1;Comorbidity 2    Comorbidities MS, OA    Examination-Activity Limitations Locomotion Level;Carry;Stand;Lift;Stairs;Squat    Examination-Participation Restrictions Meal Prep;Cleaning;Community Activity;Laundry;Shop    Stability/Clinical Decision Making Stable/Uncomplicated    Clinical Decision Making Low    Rehab Potential Poor    PT Frequency 1x / week    PT Duration 8 weeks    PT Treatment/Interventions ADLs/Self Care Home Management;Electrical Stimulation;Cryotherapy;Iontophoresis 4mg /ml Dexamethasone;Moist Heat;Traction;DME Instruction;Neuromuscular re-education;Patient/family education;Manual techniques;Passive range of motion;Taping    PT Next Visit Plan  work on balance with patient,    PT Home Exercise Plan LTR; hamstring stretch, tennis ball trigger point release ; abdominal breathing;Access Code: [email or web address].Seated Upper Trapezius Stretch - 1 x daily - 7 x weekly - 1 sets - 3 reps - 20 hold.Gentle Levator Scapulae Stretch - 1 x daily - 7 x weekly - 1 sets - 3 reps - 20 hold.Shoulder extension with resistance - Neutral - 1 x daily - 7 x weekly - 3 sets - 10 reps.Scapular Retraction with Resistance - 1 x daily - 7 x weekly - 3 sets - 10 reps    Consulted and Agree with Plan of Care Patient           Patient will benefit from skilled therapeutic intervention in order to improve the following deficits and impairments:  Abnormal gait,Difficulty walking,Decreased range of motion,Decreased mobility,Decreased strength,Postural dysfunction,Pain,Increased muscle spasms  Visit Diagnosis: Chronic bilateral low back pain without sciatica  Muscle weakness (generalized)  Difficulty in walking, not elsewhere classified  Cervicalgia  Cramp and spasm     Problem List Patient Active  Problem List   Diagnosis Date Noted  . Chronic pain syndrome 06/20/2020  . Post laminectomy syndrome 06/20/2020  . Sjogren's disease ([place]) 05/23/2020  . Primary osteoarthritis of both hands 05/23/2020  . Primary osteoarthritis of both feet 05/23/2020  . Sacroiliitis, not elsewhere classified ([place]) 12/22/2019  . Other secondary scoliosis, lumbar region 12/22/2019  . Spondylosis without myelopathy or radiculopathy, lumbar region 12/22/2019  . Cervical radiculopathy 10/18/2019  . Numbness 09/21/2019  . Bilateral carpal tunnel syndrome 09/21/2019  . High risk medication use 09/21/2019  . Memory loss 09/21/2019  . Essential hypertension 08/25/2018  . Abnormal SPEP 02/19/2018  . Hand pain 02/20/2017  . Multiple joint pain 02/18/2017  . Right elbow pain 12/09/2016  . Disturbed cognition 06/25/2016  . Trochanteric bursitis of left hip 02/18/2016  . Sciatica,  right side 10/30/2015  . Bilateral arm pain 10/10/2015  . Trochanteric bursitis of both hips 08/04/2014  . Chronic fatigue 08/04/2014  . Urinary frequency 08/04/2014  . Abnormality of gait 08/04/2014  . Unspecified visual disturbance 01/31/2013  . Transient vision disturbance 01/31/2013  . Routine general medical examination at a health care facility 11/04/2011  . Colon polyps 11/03/2011  . Hot flashes, menopausal 10/13/2011  . POSTHERPETIC NEURALGIA 10/03/2009  . DISPLCMT LUMBAR INTERVERT [place] W/O MYELOPATHY 09/05/2009  . RENAL CALCULUS, RECURRENT 09/04/2009  . Hyperlipidemia 08/31/2009  . MULTIPLE SCLEROSIS, PROGRESSIVE/RELAPSING 08/31/2009  . ALLERGIC RHINITIS 08/31/2009    Carney Living PT DPT  07/06/2020, 12:17 PM  [facility] [address] [place], [place], [address] Phone: [phone]   Fax:  [phone]  Name: TEMPERANCE KELEMEN MRN: [id number] Date of Birth: [birth year]

## 2020-07-07 ENCOUNTER — Other Ambulatory Visit: Payer: Self-pay | Admitting: Neurology

## 2020-07-09 ENCOUNTER — Other Ambulatory Visit: Payer: Self-pay

## 2020-07-09 ENCOUNTER — Ambulatory Visit: Payer: Medicare Other | Admitting: Physical Therapy

## 2020-07-09 ENCOUNTER — Encounter: Payer: Self-pay | Admitting: Physical Therapy

## 2020-07-09 DIAGNOSIS — M542 Cervicalgia: Secondary | ICD-10-CM | POA: Diagnosis not present

## 2020-07-09 DIAGNOSIS — R262 Difficulty in walking, not elsewhere classified: Secondary | ICD-10-CM | POA: Diagnosis not present

## 2020-07-09 DIAGNOSIS — M545 Low back pain, unspecified: Secondary | ICD-10-CM | POA: Diagnosis not present

## 2020-07-09 DIAGNOSIS — M6281 Muscle weakness (generalized): Secondary | ICD-10-CM | POA: Diagnosis not present

## 2020-07-09 DIAGNOSIS — G8929 Other chronic pain: Secondary | ICD-10-CM

## 2020-07-09 DIAGNOSIS — R252 Cramp and spasm: Secondary | ICD-10-CM | POA: Diagnosis not present

## 2020-07-09 NOTE — Therapy (Signed)
[place], [place], [address] Phone: [phone]   Fax:  [phone]  Physical Therapy Treatment/Progress  Patient Details  Name: Dawn [name] MRN: [id number] Date of Birth: 1945-07-31 Referring Provider (PT): Dr Christell Constant   Encounter Date: 07/09/2020  Progress Note Reporting Period 05/14/2020 to 07/09/2021  See note below for Objective Data and Assessment of Progress/Goals.       Past Medical History:  Diagnosis Date   Allergy    Dry eyes    Endometrial polyp    Essential hypertension 08/25/2018   GERD (gastroesophageal reflux disease)    History of kidney stones    Hot flashes, menopausal 10/13/2011   Estradiol started    Jaundice as teenager   no problems since   Memory loss 09/21/2019     1/2 of feet numb all the time   Multiple sclerosis ([place]) dx 2001   Neuropathy    Osteoarthritis    Sjogren's syndrome ([place]) dx oct 2021   sore muscvles, dry mouth and eyes   Vision abnormalities     Past Surgical History:  Procedure Laterality Date   ABDOMINAL HYSTERECTOMY  2002   partial   colonscopy  2011   DILATATION & CURETTAGE/HYSTEROSCOPY WITH MYOSURE N/A 06/08/2020   Procedure: DILATATION & CURETTAGE/HYSTEROSCOPY/Polypectomy WITH MYOSURE;  Surgeon: Armandina Stammer, DO;  Location: [facility];  Service: Gynecology;  Laterality: N/A;   LUMBAR FUSION  2001   UPPER GI ENDOSCOPY  yrs ago    There were no vitals filed for this visit.   Subjective Assessment - 07/09/20 1023    Subjective Patient reports that her right leg is better. She is able to stand longer and able to complete more of her dialy tasks.    Pertinent History MS    How long can you stand comfortably? hurts as soon as she stands. Has to shift frequently    How long can you walk comfortably? limited community ambualtion    Diagnostic tests Nothing recent    Currently in Pain? No/denies               [facility] PT Assessment - 07/09/20 0001      Strength   Right Hip Flexion 4/5    Right Hip ABduction 4+/5    Right Hip ADduction 4+/5    Left Hip Flexion 4+/5    Left Hip ABduction 4+/5    Right Knee Flexion 4/5    Right Knee Extension 4/5    Left Knee Flexion 5/5    Left Knee Extension 5/5      Transfers   Comments 5x sit to stand test 16 seconds      High Level Balance   High Level Balance Comments min a required with narrow base of siupport eyes closed. Improved balance overall with eyes opened testing                         [facility] Adult PT Treatment/Exercise - 07/09/20 0001      Lumbar Exercises: Stretches   Lower Trunk Rotation Limitations x20    Piriformis Stretch Limitations 3x20 sec holdleft only today      Lumbar Exercises: Supine   Other Supine Lumbar Exercises supine clamshell x15 yellow       Manual Therapy   Manual Therapy Soft tissue mobilization    Manual therapy comments perfromed bilaterally    Joint Mobilization Long axis distraction    Soft  tissue mobilization soft tissue mobilization to lateral hips with rollwer     Passive ROM focus on left hip hip flexionIR/ER     Manual Traction LAD Grade II-III                  PT Education - 07/09/20 1029    Education Details reviewed HEp and symptom mangement    Person(s) Educated Patient    Methods Explanation;Demonstration;Tactile cues;Verbal cues    Comprehension Verbalized understanding;Returned demonstration;Verbal cues required;Tactile cues required            PT Short Term Goals - 03/28/20 1446      PT SHORT TERM GOAL #1   Title Patient will report no pain radiating into anterior thigh    Baseline no    Time 4    Period Weeks    Status Achieved    Target Date 12/09/19      PT SHORT TERM GOAL #2   Title Patient will increase bilateral LE strength to 4/5    Baseline 4+/5 gross    Time 4    Period Weeks    Status Achieved      PT SHORT TERM GOAL #3    Title Patient will increase passive right hip flexion to 105 degrrees    Baseline 100 today with pain onthe right side    Time 4    Period Weeks    Status On-going      PT SHORT TERM GOAL #4   Title Patient will increase right cervical rotation by 10 degrees    Baseline to 58 degres today    Time 4    Period Weeks    Status On-going    Target Date 01/09/20      PT SHORT TERM GOAL #5   Title Patient will increase ross bilateral UE strength to 4+/5    Baseline improved to 4/5    Time 4    Period Weeks    Status On-going             PT Long Term Goals - 04/30/20 1430      PT LONG TERM GOAL #1   Title Patient will stand for 30 min without self reported increase pain in order to stand at a kitchen sink.    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     Baseline pain at edn range hip flexion and ir but intensity decreased    Time 6    Period Weeks    Status On-going      PT LONG TERM GOAL #4   Title Patient will reach behind his head without pain in order to perfrom ADL's    Baseline mild pain and tightness but improved    Time 6    Period Weeks    Status On-going      PT LONG TERM GOAL #5   Title Patient will report no radicular pain  into her hands    Baseline more intermittent    Time 6    Period Weeks    Status On-going      PT LONG TERM GOAL #6   Title POatient will improve ABC score to 50% function in order to decrease her risk of fall    Baseline 40%    Period Weeks    Status On-going                 Plan - 07/09/20 1316    Clinical Impression Statement  Patients exacerbation in her right hip is decreasing. She hadimporved active and passive motion of her hip today compared to last week. She feels like she is standing adn walking better then the past 2 visits. She has begun to be able to do her light exercises again. She continues to use her cane. Therapy continues to foucs on manual therapy to lumbar spine and right LE. Her sit to stand test has improved. Her left LE strength has improved signifcantly. She still has limitations in right LE strength> She would benefit from further skilled therapy to progress off the cane and back to indepdnent mobility, imporve ability to stand and perfrom ADL's, and to help maintain mobility with her MS.    Personal Factors and Comorbidities Comorbidity 1;Comorbidity 2    Comorbidities MS, OA    Examination-Activity Limitations Locomotion Level;Carry;Stand;Lift;Stairs;Squat    Examination-Participation Restrictions Meal Prep;Cleaning;Community Activity;Laundry;Shop    Stability/Clinical Decision Making Stable/Uncomplicated    Clinical Decision Making Low    Rehab Potential Good    PT Frequency 1x / week    PT Duration 8 weeks    PT Treatment/Interventions ADLs/Self Care Home Management;Electrical Stimulation;Cryotherapy;Iontophoresis 4mg /ml Dexamethasone;Moist Heat;Traction;DME Instruction;Neuromuscular re-education;Patient/family education;Manual techniques;Passive range of motion;Taping    PT Next Visit Plan work on balance with patient,    PT Home Exercise Plan LTR; hamstring stretch, tennis ball trigger point release ; abdominal breathing;Access Code: [email or web address] Upper Trapezius Stretch - 1 x daily - 7 x weekly - 1 sets - 3 reps - 20 holdGentle Levator Scapulae Stretch - 1 x daily - 7 x weekly - 1 sets - 3 reps - 20 holdShoulder extension with resistance - Neutral - 1 x daily - 7 x weekly - 3 sets - 10 repsScapular Retraction with Resistance - 1 x daily - 7 x weekly - 3 sets - 10 reps    Consulted  and Agree with Plan of Care Patient           Patient will benefit from skilled therapeutic intervention in order to improve the following deficits and impairments:  Abnormal gait,Difficulty walking,Decreased range of motion,Decreased mobility,Decreased strength,Postural dysfunction,Pain,Increased muscle spasms  Visit Diagnosis: Chronic bilateral low back pain without sciatica  Muscle weakness (generalized)  Difficulty in walking, not elsewhere classified  Cervicalgia  Cramp and spasm     Problem List Patient Active Problem List   Diagnosis Date Noted   Chronic pain syndrome 06/20/2020   Post laminectomy syndrome 06/20/2020   Sjogren's disease ([place]) 05/23/2020   Primary osteoarthritis of both hands 05/23/2020   Primary osteoarthritis of both feet 05/23/2020   Sacroiliitis, not elsewhere classified ([place]) 12/22/2019   Other secondary scoliosis, lumbar region 12/22/2019   Spondylosis without myelopathy or radiculopathy, lumbar region 12/22/2019   Cervical radiculopathy 10/18/2019   Numbness 09/21/2019   Bilateral carpal tunnel syndrome 09/21/2019   High risk medication use 09/21/2019   Memory loss 09/21/2019   Essential hypertension 08/25/2018   Abnormal SPEP 02/19/2018  Hand pain 02/20/2017   Multiple joint pain 02/18/2017   Right elbow pain 12/09/2016   Disturbed cognition 06/25/2016   Trochanteric bursitis of left hip 02/18/2016   Sciatica, right side 10/30/2015   Bilateral arm pain 10/10/2015   Trochanteric bursitis of both hips 08/04/2014   Chronic fatigue 08/04/2014   Urinary frequency 08/04/2014   Abnormality of gait 08/04/2014   Unspecified visual disturbance 01/31/2013   Transient vision disturbance 01/31/2013   Routine general medical examination at a health care facility 11/04/2011   Colon polyps 11/03/2011   Hot flashes, menopausal 10/13/2011   POSTHERPETIC NEURALGIA 10/03/2009   DISPLCMT LUMBAR INTERVERT DISC W/O  MYELOPATHY 09/05/2009   RENAL CALCULUS, RECURRENT 09/04/2009   Hyperlipidemia 08/31/2009   MULTIPLE SCLEROSIS, PROGRESSIVE/RELAPSING 08/31/2009   ALLERGIC RHINITIS 08/31/2009    Carney Living PT DPT  07/09/2020, 1:21 PM  [facility] [address] [place], [place], [address] Phone: [phone]   Fax:  [phone]  Name: DALISSA LOVIN MRN: [id number] Date of Birth: 1946/01/18

## 2020-07-16 ENCOUNTER — Ambulatory Visit: Payer: Medicare Other | Admitting: Physical Therapy

## 2020-07-16 ENCOUNTER — Other Ambulatory Visit: Payer: Self-pay

## 2020-07-16 ENCOUNTER — Encounter: Payer: Self-pay | Admitting: Physical Therapy

## 2020-07-16 DIAGNOSIS — M6281 Muscle weakness (generalized): Secondary | ICD-10-CM | POA: Diagnosis not present

## 2020-07-16 DIAGNOSIS — R262 Difficulty in walking, not elsewhere classified: Secondary | ICD-10-CM | POA: Diagnosis not present

## 2020-07-16 DIAGNOSIS — G8929 Other chronic pain: Secondary | ICD-10-CM

## 2020-07-16 DIAGNOSIS — M542 Cervicalgia: Secondary | ICD-10-CM

## 2020-07-16 DIAGNOSIS — M545 Low back pain, unspecified: Secondary | ICD-10-CM | POA: Diagnosis not present

## 2020-07-16 DIAGNOSIS — R252 Cramp and spasm: Secondary | ICD-10-CM

## 2020-07-24 ENCOUNTER — Encounter: Payer: Self-pay | Admitting: Physical Therapy

## 2020-07-24 ENCOUNTER — Ambulatory Visit: Payer: Medicare Other | Attending: Neurology | Admitting: Physical Therapy

## 2020-07-24 ENCOUNTER — Other Ambulatory Visit: Payer: Self-pay

## 2020-07-24 DIAGNOSIS — R252 Cramp and spasm: Secondary | ICD-10-CM | POA: Diagnosis not present

## 2020-07-24 DIAGNOSIS — G8929 Other chronic pain: Secondary | ICD-10-CM | POA: Diagnosis not present

## 2020-07-24 DIAGNOSIS — M6281 Muscle weakness (generalized): Secondary | ICD-10-CM | POA: Diagnosis not present

## 2020-07-24 DIAGNOSIS — M542 Cervicalgia: Secondary | ICD-10-CM | POA: Diagnosis not present

## 2020-07-24 DIAGNOSIS — M545 Low back pain, unspecified: Secondary | ICD-10-CM | POA: Insufficient documentation

## 2020-07-24 DIAGNOSIS — R262 Difficulty in walking, not elsewhere classified: Secondary | ICD-10-CM

## 2020-07-24 NOTE — Therapy (Signed)
[place], [place], [address] Phone: [phone]   Fax:  [phone]  Physical Therapy Treatment  Patient Details  Name: Dawn [name] MRN: [id number] Date of Birth: January 10, 1946 Referring Provider (PT): Dr Christell Constant   Encounter Date: 07/16/2020    Past Medical History:  Diagnosis Date  . Allergy   . Dry eyes   . Endometrial polyp   . Essential hypertension 08/25/2018  . GERD (gastroesophageal reflux disease)   . History of kidney stones   . Hot flashes, menopausal 10/13/2011   Estradiol started   . Jaundice as teenager   no problems since  . Memory loss 09/21/2019     1/2 of feet numb all the time  . Multiple sclerosis ([place]) dx 2001  . Neuropathy   . Osteoarthritis   . Sjogren's syndrome ([place]) dx oct 2021   sore muscvles, dry mouth and eyes  . Vision abnormalities     Past Surgical History:  Procedure Laterality Date  . ABDOMINAL HYSTERECTOMY  2002   partial  . colonscopy  2011  . DILATATION & CURETTAGE/HYSTEROSCOPY WITH MYOSURE N/A 06/08/2020   Procedure: DILATATION & CURETTAGE/HYSTEROSCOPY/Polypectomy WITH MYOSURE;  Surgeon: Armandina Stammer, DO;  Location: [facility];  Service: Gynecology;  Laterality: N/A;  . LUMBAR FUSION  2001  . UPPER GI ENDOSCOPY  yrs ago    There were no vitals filed for this visit.   Subjective Assessment - 07/24/20 1647    Subjective Patient reports her right hip continues to improve. She is haivng more pain in the groin but the lateral pain has improved. She is now qving pain specifically in the groin and intermittent lateral hip pain.    How long can you stand comfortably? hurts as soon as she stands. Has to shift frequently    How long can you walk comfortably? limited community ambualtion    Diagnostic tests Nothing recent    Currently in Pain? Yes    Pain Score 5     Pain Location Groin    Pain Orientation Right    Pain Descriptors  / Indicators Aching    Pain Type Chronic pain    Pain Onset More than a month ago    Pain Frequency Constant    Aggravating Factors  bending the hip    Pain Relieving Factors rest    Effect of Pain on Daily Activities difficulty walking                             [facility] Adult PT Treatment/Exercise - 07/24/20 0001      Neck Exercises: Seated   Other Seated Exercise bilateral er 2x10 red; horzontal abduction red 2x10; ewand flexion 2x10; d2 flexion red in very limited range    Other Seated Exercise laq 2x15; hamstring curl yellow 2x10; clamshells 2x20 yellow;        Manual Therapy   Joint Mobilization posterior glide on bolster for flexion    Soft tissue mobilization soft tissue mobilization to lateral hips with rollwer     Passive ROM focus on left hip hip flexionIR/ER     Manual Traction LAD Grade II-III                    PT Short Term Goals - 03/28/20 1446      PT SHORT TERM GOAL #1   Title Patient will report no pain radiating into anterior  thigh    Baseline no    Time 4    Period Weeks    Status Achieved    Target Date 12/09/19      PT SHORT TERM GOAL #2   Title Patient will increase bilateral LE strength to 4/5    Baseline 4+/5 gross    Time 4    Period Weeks    Status Achieved      PT SHORT TERM GOAL #3   Title Patient will increase passive right hip flexion to 105 degrrees    Baseline 100 today with pain onthe right side    Time 4    Period Weeks    Status On-going      PT SHORT TERM GOAL #4   Title Patient will increase right cervical rotation by 10 degrees    Baseline to 58 degres today    Time 4    Period Weeks    Status On-going    Target Date 01/09/20      PT SHORT TERM GOAL #5   Title Patient will increase ross bilateral UE strength to 4+/5    Baseline improved to 4/5    Time 4    Period Weeks    Status On-going             PT Long Term Goals - 04/30/20 1430      PT LONG TERM GOAL #1   Title Patient will  stand for 30 min without self reported increase pain in order to stand at a kitchen sink.    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     Baseline pain at edn range hip flexion and ir but intensity decreased    Time 6    Period Weeks    Status On-going      PT LONG TERM GOAL #4   Title Patient will reach behind his head without pain in order to perfrom ADL's    Baseline mild pain and tightness but improved    Time 6    Period Weeks    Status On-going      PT LONG TERM GOAL #5   Title Patient will report no radicular pain into her hands    Baseline more intermittent    Time 6    Period Weeks    Status On-going      PT LONG TERM GOAL #6   Title POatient will improve ABC score to 50% function in order to decrease her risk of fall    Baseline 40%    Period Weeks    Status On-going                 Plan - 07/24/20 1654    Clinical Impression Statement Pain was mostly located in the groin today but she ocntinued to have spasming in her gluteal and lateral hip. Therapy perfromed manual therapy to area using a roller and manual trigger  point release. Therapy also perfromed posterior glides to the right hip to improve flexion. She was given an upper bodypostrual correction series of exercises to work on.    Personal Factors and Comorbidities Comorbidity 1;Comorbidity 2    Comorbidities MS, OA    Examination-Activity Limitations Locomotion Level;Carry;Stand;Lift;Stairs;Squat    Examination-Participation Restrictions Meal Prep;Cleaning;Community Activity;Laundry;Shop    Stability/Clinical  Decision Making Stable/Uncomplicated    Rehab Potential Good    PT Frequency 1x / week    PT Duration 8 weeks    PT Treatment/Interventions ADLs/Self Care Home Management;Electrical Stimulation;Cryotherapy;Iontophoresis 4mg /ml Dexamethasone;Moist Heat;Traction;DME Instruction;Neuromuscular re-education;Patient/family education;Manual techniques;Passive range of motion;Taping    PT Next Visit Plan continue to work on strengthneing    PT Home Exercise Plan LTR; hamstring stretch, tennis ball trigger point release ; abdominal breathing;Access Code: [email or web address].Seated Upper Trapezius Stretch - 1 x daily - 7 x weekly - 1 sets - 3 reps - 20 hold.Gentle Levator Scapulae Stretch - 1 x daily - 7 x weekly - 1 sets - 3 reps - 20 hold.Shoulder extension with resistance - Neutral - 1 x daily - 7 x weekly - 3 sets - 10 reps.Scapular Retraction with Resistance - 1 x daily - 7 x weekly - 3 sets - 10 reps    Consulted and Agree with Plan of Care Patient           Patient will benefit from skilled therapeutic intervention in order to improve the following deficits and impairments:  Abnormal gait,Difficulty walking,Decreased range of motion,Decreased mobility,Decreased strength,Postural dysfunction,Pain,Increased muscle spasms  Visit Diagnosis: Chronic bilateral low back pain without sciatica  Muscle weakness (generalized)  Difficulty in walking, not elsewhere classified  Cervicalgia  Cramp and spasm     Problem List Patient Active Problem List   Diagnosis Date Noted  . Chronic pain syndrome 06/20/2020  . Post laminectomy syndrome 06/20/2020  . Sjogren's disease ([facility]) 05/23/2020  . Primary osteoarthritis of both hands 05/23/2020  . Primary osteoarthritis of both feet 05/23/2020  . Sacroiliitis, not elsewhere classified ([facility]) 12/22/2019  . Other secondary scoliosis, lumbar region 12/22/2019  . Spondylosis without myelopathy or radiculopathy, lumbar region 12/22/2019  . Cervical radiculopathy  10/18/2019  . Numbness 09/21/2019  . Bilateral carpal tunnel syndrome 09/21/2019  . High risk medication use 09/21/2019  . Memory loss 09/21/2019  . Essential hypertension 08/25/2018  . Abnormal SPEP 02/19/2018  . Hand pain 02/20/2017  . Multiple joint pain 02/18/2017  . Right elbow pain 12/09/2016  . Disturbed cognition 06/25/2016  . Trochanteric bursitis of left hip 02/18/2016  . Sciatica, right side 10/30/2015  . Bilateral arm pain 10/10/2015  . Trochanteric bursitis of both hips 08/04/2014  . Chronic fatigue 08/04/2014  . Urinary frequency 08/04/2014  . Abnormality of gait 08/04/2014  . Unspecified visual disturbance 01/31/2013  . Transient vision disturbance 01/31/2013  . Routine general medical examination at a health care facility 11/04/2011  . Colon polyps 11/03/2011  . Hot flashes, menopausal 10/13/2011  . POSTHERPETIC NEURALGIA 10/03/2009  . DISPLCMT LUMBAR INTERVERT DISC W/O MYELOPATHY 09/05/2009  . RENAL CALCULUS, RECURRENT 09/04/2009  . Hyperlipidemia 08/31/2009  . MULTIPLE SCLEROSIS, PROGRESSIVE/RELAPSING 08/31/2009  . ALLERGIC RHINITIS 08/31/2009     10/29/2009  PTDPT  07/24/2020, 4:55 PM  [facility] [address] [place], [place], [place] Phone: [phone]   Fax:  [phone]  Name: Dawn [name] MRN: [name] Date of Birth: 02-01-1946

## 2020-07-25 ENCOUNTER — Encounter: Payer: Self-pay | Admitting: Physical Therapy

## 2020-07-25 NOTE — Therapy (Signed)
[place], [place], [address] Phone: [phone]   Fax:  [phone]  Physical Therapy Treatment  Patient Details  Name: Dawn [name] MRN: [id number] Date of Birth: 28-Nov-1945 Referring Provider (PT): Dr Christell Constant   Encounter Date: 07/24/2020   PT End of Session - 07/24/20 1549    Visit Number 15    Number of Visits 16    Date for PT Re-Evaluation 09/03/20    Authorization Type progress note done on 45 th visit Next at 48    PT Start Time 1500    PT Stop Time 1543    PT Time Calculation (min) 43 min    Activity Tolerance Patient tolerated treatment well    Behavior During Therapy [facility] for tasks assessed/performed           Past Medical History:  Diagnosis Date  . Allergy   . Dry eyes   . Endometrial polyp   . Essential hypertension 08/25/2018  . GERD (gastroesophageal reflux disease)   . History of kidney stones   . Hot flashes, menopausal 10/13/2011   Estradiol started   . Jaundice as teenager   no problems since  . Memory loss 09/21/2019     1/2 of feet numb all the time  . Multiple sclerosis ([place]) dx 2001  . Neuropathy   . Osteoarthritis   . Sjogren's syndrome ([place]) dx oct 2021   sore muscvles, dry mouth and eyes  . Vision abnormalities     Past Surgical History:  Procedure Laterality Date  . ABDOMINAL HYSTERECTOMY  2002   partial  . colonscopy  2011  . DILATATION & CURETTAGE/HYSTEROSCOPY WITH MYOSURE N/A 06/08/2020   Procedure: DILATATION & CURETTAGE/HYSTEROSCOPY/Polypectomy WITH MYOSURE;  Surgeon: Armandina Stammer, DO;  Location: [place];  Service: Gynecology;  Laterality: N/A;  . LUMBAR FUSION  2001  . UPPER GI ENDOSCOPY  yrs ago    There were no vitals filed for this visit.   Subjective Assessment - 07/25/20 1126    Subjective Patient has been able to go back to the pool. She is having less pain in her right hip although yesterday she had some  increased pain in her groin. She has been working on her exercises and stretches 2-3x a day which she reports has helepd.    Pertinent History MS    How long can you stand comfortably? hurts as soon as she stands. Has to shift frequently    How long can you walk comfortably? limited community ambualtion    Diagnostic tests Nothing recent    Currently in Pain? Yes    Pain Score 5     Pain Location Groin    Pain Orientation Right    Pain Descriptors / Indicators Aching    Pain Type Chronic pain    Pain Onset More than a month ago    Pain Frequency Constant    Aggravating Factors  bending the hip    Pain Relieving Factors rest    Effect of Pain on Daily Activities difficulty walking                             [facility] Adult PT Treatment/Exercise - 07/25/20 0001      Lumbar Exercises: Stretches   Lower Trunk Rotation Limitations x20    Piriformis Stretch Limitations 3x20 sec holdleft only today      Lumbar Exercises: Standing   Other  Standing Lumbar Exercises right march with focus on pain 2x10      Lumbar Exercises: Seated   LAQ on Chair Limitations 2x20 2lb weight      Lumbar Exercises: Supine   Other Supine Lumbar Exercises bridge x20; supine march in pain free range 2x10 each leg;    Other Supine Lumbar Exercises supine double knee to chest with breathing 3x10; supine ball press 3x10 with breathing      Manual Therapy   Joint Mobilization posterior glide on bolster for flexion    Soft tissue mobilization soft tissue mobilization to lateral hips with rollwer     Passive ROM focus on left hip hip flexionIR/ER     Manual Traction LAD Grade II-III            Trigger Point Dry Needling - 07/25/20 0001    Other Dry Needling 3 spots in piriformis 2 spots in right gluteal    Gluteus Medius Response Twitch response elicited                PT Education - 07/25/20 1129    Education Details HEP and symptom management    Person(s) Educated Patient     Methods Explanation;Demonstration;Tactile cues;Verbal cues    Comprehension Verbalized understanding;Returned demonstration;Verbal cues required;Tactile cues required            PT Short Term Goals - 03/28/20 1446      PT SHORT TERM GOAL #1   Title Patient will report no pain radiating into anterior thigh    Baseline no    Time 4    Period Weeks    Status Achieved    Target Date 12/09/19      PT SHORT TERM GOAL #2   Title Patient will increase bilateral LE strength to 4/5    Baseline 4+/5 gross    Time 4    Period Weeks    Status Achieved      PT SHORT TERM GOAL #3   Title Patient will increase passive right hip flexion to 105 degrrees    Baseline 100 today with pain onthe right side    Time 4    Period Weeks    Status On-going      PT SHORT TERM GOAL #4   Title Patient will increase right cervical rotation by 10 degrees    Baseline to 58 degres today    Time 4    Period Weeks    Status On-going    Target Date 01/09/20      PT SHORT TERM GOAL #5   Title Patient will increase ross bilateral UE strength to 4+/5    Baseline improved to 4/5    Time 4    Period Weeks    Status On-going             PT Long Term Goals - 04/30/20 1430      PT LONG TERM GOAL #1   Title Patient will stand for 30 min without self reported increase pain in order to stand at a kitchen sink.    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     Baseline pain at edn range hip flexion and ir but intensity decreased    Time 6    Period Weeks    Status On-going      PT LONG TERM GOAL #4   Title  Patient will reach behind his head without pain in order to perfrom ADL's    Baseline mild pain and tightness but improved    Time 6    Period Weeks    Status On-going      PT LONG TERM GOAL #5   Title Patient will report no radicular pain into her hands    Baseline more intermittent    Time 6    Period Weeks    Status On-going      PT LONG TERM GOAL #6   Title POatient will improve ABC score to 50% function in order to decrease her risk of fall    Baseline 40%    Period Weeks    Status On-going                 Plan - 07/25/20 1129    Clinical Impression Statement Therapy was able to focus more on exercises today. She did well withball exercises in a decompressed position. She is responding well to posterior hip mobilization. She has improved hip flexion with posterior mobilization. Therapy worked on right hip flor strengthening in a supine position. We will continue to progress as tolerated.    Personal Factors and Comorbidities Comorbidity 1;Comorbidity 2    Comorbidities MS, OA    Examination-Activity Limitations Locomotion Level;Carry;Stand;Lift;Stairs;Squat    Examination-Participation Restrictions Meal Prep;Cleaning;Community Activity;Laundry;Shop    Stability/Clinical Decision Making Stable/Uncomplicated    Clinical Decision Making Low    Rehab Potential Good    PT Frequency 1x / week    PT Duration 8 weeks    PT Treatment/Interventions ADLs/Self Care Home Management;Electrical Stimulation;Cryotherapy;Iontophoresis 4mg /ml Dexamethasone;Moist Heat;Traction;DME Instruction;Neuromuscular re-education;Patient/family education;Manual techniques;Passive range of motion;Taping    PT Next Visit Plan continue to work on [place]; hamstring stretch, tennis ball trigger point release ; abdominal breathing;Access Code: [email or web address].Seated Upper Trapezius Stretch - 1 x daily - 7 x weekly - 1 sets - 3 reps - 20 hold.Gentle Levator Scapulae  Stretch - 1 x daily - 7 x weekly - 1 sets - 3 reps - 20 hold.Shoulder extension with resistance - Neutral - 1 x daily - 7 x weekly - 3 sets - 10 reps.Scapular Retraction with Resistance - 1 x daily - 7 x weekly - 3 sets - 10 reps    Consulted and Agree with Plan of Care Patient           Patient will benefit from skilled therapeutic intervention in order to improve the following deficits and impairments:  Abnormal gait,Difficulty walking,Decreased range of motion,Decreased mobility,Decreased strength,Postural dysfunction,Pain,Increased muscle spasms  Visit Diagnosis: Chronic bilateral low back pain without sciatica  Muscle weakness (generalized)  Difficulty in walking, not elsewhere classified  Cervicalgia  Cramp and spasm     Problem List Patient Active Problem List   Diagnosis Date Noted  . Chronic pain syndrome 06/20/2020  . Post laminectomy syndrome 06/20/2020  . Sjogren's disease ([place]) 05/23/2020  . Primary osteoarthritis of both hands 05/23/2020  . Primary osteoarthritis of both feet 05/23/2020  . Sacroiliitis, not elsewhere  classified ([place]) 12/22/2019  . Other secondary scoliosis, lumbar region 12/22/2019  . Spondylosis without myelopathy or radiculopathy, lumbar region 12/22/2019  . Cervical radiculopathy 10/18/2019  . Numbness 09/21/2019  . Bilateral carpal tunnel syndrome 09/21/2019  . High risk medication use 09/21/2019  . Memory loss 09/21/2019  . Essential hypertension 08/25/2018  . Abnormal SPEP 02/19/2018  . Hand pain 02/20/2017  . Multiple joint pain 02/18/2017  . Right elbow pain 12/09/2016  . Disturbed cognition 06/25/2016  . Trochanteric bursitis of left hip 02/18/2016  . Sciatica, right side 10/30/2015  . Bilateral arm pain 10/10/2015  . Trochanteric bursitis of both hips 08/04/2014  . Chronic fatigue 08/04/2014  . Urinary frequency 08/04/2014  . Abnormality of gait 08/04/2014  . Unspecified visual disturbance 01/31/2013  . Transient vision  disturbance 01/31/2013  . Routine general medical examination at a health care facility 11/04/2011  . Colon polyps 11/03/2011  . Hot flashes, menopausal 10/13/2011  . POSTHERPETIC NEURALGIA 10/03/2009  . DISPLCMT LUMBAR INTERVERT [place] W/O MYELOPATHY 09/05/2009  . RENAL CALCULUS, RECURRENT 09/04/2009  . Hyperlipidemia 08/31/2009  . MULTIPLE SCLEROSIS, PROGRESSIVE/RELAPSING 08/31/2009  . ALLERGIC RHINITIS 08/31/2009    Carney Living PT DPT  07/25/2020, 11:36 AM  [facility] [address] [place], [place], [address] Phone: [phone]   Fax:  [phone]  Name: Dawn [name] MRN: [id number] Date of Birth: [birth year]

## 2020-07-31 ENCOUNTER — Other Ambulatory Visit: Payer: Self-pay

## 2020-07-31 ENCOUNTER — Encounter: Payer: Self-pay | Admitting: Physical Therapy

## 2020-07-31 ENCOUNTER — Ambulatory Visit: Payer: Medicare Other | Admitting: Physical Therapy

## 2020-07-31 DIAGNOSIS — M6281 Muscle weakness (generalized): Secondary | ICD-10-CM | POA: Diagnosis not present

## 2020-07-31 DIAGNOSIS — G8929 Other chronic pain: Secondary | ICD-10-CM | POA: Diagnosis not present

## 2020-07-31 DIAGNOSIS — R252 Cramp and spasm: Secondary | ICD-10-CM

## 2020-07-31 DIAGNOSIS — M542 Cervicalgia: Secondary | ICD-10-CM | POA: Diagnosis not present

## 2020-07-31 DIAGNOSIS — M545 Low back pain, unspecified: Secondary | ICD-10-CM | POA: Diagnosis not present

## 2020-07-31 DIAGNOSIS — R262 Difficulty in walking, not elsewhere classified: Secondary | ICD-10-CM | POA: Diagnosis not present

## 2020-08-01 ENCOUNTER — Encounter: Payer: Self-pay | Admitting: Physical Therapy

## 2020-08-01 NOTE — Therapy (Signed)
[place], [place], [address] Phone: [phone]   Fax:  [phone]  Physical Therapy Treatment  Patient Details  Name: Dawn [name] MRN: [id number] Date of Birth: [birth year] Referring Provider (PT): Dr Christell Constant   Encounter Date: 07/31/2020   PT End of Session - 08/01/20 0757    Visit Number 55    Number of Visits 86    Date for PT Re-Evaluation 09/03/20    Authorization Type progress note done on 37 th visit Next at 64    PT Start Time 0800    PT Stop Time 0840    PT Time Calculation (min) 40 min    Activity Tolerance Patient tolerated treatment well    Behavior During Therapy [facility] for tasks assessed/performed           Past Medical History:  Diagnosis Date  . Allergy   . Dry eyes   . Endometrial polyp   . Essential hypertension 08/25/2018  . GERD (gastroesophageal reflux disease)   . History of kidney stones   . Hot flashes, menopausal 10/13/2011   Estradiol started   . Jaundice as teenager   no problems since  . Memory loss 09/21/2019     1/2 of feet numb all the time  . Multiple sclerosis ([place]) dx 2001  . Neuropathy   . Osteoarthritis   . Sjogren's syndrome ([place]) dx oct 2021   sore muscvles, dry mouth and eyes  . Vision abnormalities     Past Surgical History:  Procedure Laterality Date  . ABDOMINAL HYSTERECTOMY  2002   partial  . colonscopy  2011  . DILATATION & CURETTAGE/HYSTEROSCOPY WITH MYOSURE N/A 06/08/2020   Procedure: DILATATION & CURETTAGE/HYSTEROSCOPY/Polypectomy WITH MYOSURE;  Surgeon: Armandina Stammer, DO;  Location: [facility];  Service: Gynecology;  Laterality: N/A;  . LUMBAR FUSION  2001  . UPPER GI ENDOSCOPY  yrs ago    There were no vitals filed for this visit.   Subjective Assessment - 07/31/20 0805    Subjective Patient reports that the pain in her hip is better. She feels like she has a band around her upper wuad now. She has een  working on her exercises and swimming 3x a week. zStanding is a little better.    Pertinent History MS    How long can you stand comfortably? hurts as soon as she stands. Has to shift frequently    How long can you walk comfortably? limited community ambualtion    Diagnostic tests Nothing recent    Currently in Pain? Yes    Pain Score 3     Pain Location Leg    Pain Orientation Right;Upper    Pain Descriptors / Indicators Aching    Pain Type Chronic pain    Pain Onset More than a month ago    Pain Frequency Constant    Aggravating Factors  standing    Pain Relieving Factors rest    Effect of Pain on Daily Activities difficulty walking    Multiple Pain Sites No                             [facility] Adult PT Treatment/Exercise - 08/01/20 0001      Lumbar Exercises: Stretches   Active Hamstring Stretch 3 reps;20 seconds    Lower Trunk Rotation Limitations x20    Piriformis Stretch 2 reps;20 seconds    Piriformis Stretch Limitations 3x20  sec holdleft only today      Lumbar Exercises: Supine   Pelvic Tilt Limitations x20 with breating    Clam Limitations red band 2x10    Other Supine Lumbar Exercises bridge x20; supine march in pain free range 2x10 each leg;    Other Supine Lumbar Exercises supine double knee to chest with breathing 3x10; supine ball press 3x10 with breathing      Manual Therapy   Joint Mobilization posterior glide on bolster for flexion and inferior glide grad II and III;    Soft tissue mobilization soft tissue mobilization to lateral hips with roller to anterior hip; trigger point release to anterior hip. Several spots found    Manual Traction LAD Grade II-III                  PT Education - 08/01/20 0756    Education Details reviewed self trigger point releasre to the quad    Person(s) Educated Patient    Methods Explanation;Demonstration;Tactile cues;Verbal cues    Comprehension Verbalized understanding;Returned demonstration;Verbal  cues required;Tactile cues required            PT Short Term Goals - 03/28/20 1446      PT SHORT TERM GOAL #1   Title Patient will report no pain radiating into anterior thigh    Baseline no    Time 4    Period Weeks    Status Achieved    Target Date 12/09/19      PT SHORT TERM GOAL #2   Title Patient will increase bilateral LE strength to 4/5    Baseline 4+/5 gross    Time 4    Period Weeks    Status Achieved      PT SHORT TERM GOAL #3   Title Patient will increase passive right hip flexion to 105 degrrees    Baseline 100 today with pain onthe right side    Time 4    Period Weeks    Status On-going      PT SHORT TERM GOAL #4   Title Patient will increase right cervical rotation by 10 degrees    Baseline to 58 degres today    Time 4    Period Weeks    Status On-going    Target Date 01/09/20      PT SHORT TERM GOAL #5   Title Patient will increase ross bilateral UE strength to 4+/5    Baseline improved to 4/5    Time 4    Period Weeks    Status On-going             PT Long Term Goals - 04/30/20 1430      PT LONG TERM GOAL #1   Title Patient will stand for 30 min without self reported increase pain in order to stand at a kitchen sink.    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  Baseline pain at edn range hip flexion and ir but intensity decreased    Time 6    Period Weeks    Status On-going      PT LONG TERM GOAL #4   Title Patient will reach behind his head without pain in order to perfrom ADL's    Baseline mild pain and tightness but improved    Time 6     Period Weeks    Status On-going      PT LONG TERM GOAL #5   Title Patient will report no radicular pain into her hands    Baseline more intermittent    Time 6    Period Weeks    Status On-going      PT LONG TERM GOAL #6   Title POatient will improve ABC score to 50% function in order to decrease her risk of fall    Baseline 40%    Period Weeks    Status On-going                 Plan - 08/01/20 0809    Clinical Impression Statement Patients had trigger points in her quad today. She felt improved pain with the roller. Therapy continues to work on hip mobilization. She tolerated ther-ex well. She will continue with exercises at home. She continues to go to the pool 3x a week.    Personal Factors and Comorbidities Comorbidity 1;Comorbidity 2    Comorbidities MS, OA    Examination-Activity Limitations Locomotion Level;Carry;Stand;Lift;Stairs;Squat    Examination-Participation Restrictions Meal Prep;Cleaning;Community Activity;Laundry;Shop    Stability/Clinical Decision Making Stable/Uncomplicated    Clinical Decision Making Low    Rehab Potential Good    PT Frequency 1x / week    PT Duration 8 weeks    PT Treatment/Interventions ADLs/Self Care Home Management;Electrical Stimulation;Cryotherapy;Iontophoresis 4mg /ml Dexamethasone;Moist Heat;Traction;DME Instruction;Neuromuscular re-education;Patient/family education;Manual techniques;Passive range of motion;Taping    PT Next Visit Plan continue to work on [place]; hamstring stretch, tennis ball trigger point release ; abdominal breathing;Access Code: [email or web address].Seated Upper Trapezius Stretch - 1 x daily - 7 x weekly - 1 sets - 3 reps - 20 hold.Gentle Levator Scapulae Stretch - 1 x daily - 7 x weekly - 1 sets - 3 reps - 20 hold.Shoulder extension with resistance - Neutral - 1 x daily - 7 x weekly - 3 sets - 10 reps.Scapular Retraction with Resistance - 1 x daily - 7 x weekly - 3 sets - 10 reps     Consulted and Agree with Plan of Care Patient           Patient will benefit from skilled therapeutic intervention in order to improve the following deficits and impairments:  Abnormal gait,Difficulty walking,Decreased range of motion,Decreased mobility,Decreased strength,Postural dysfunction,Pain,Increased muscle spasms  Visit Diagnosis: Chronic bilateral low back pain without sciatica  Muscle weakness (generalized)  Difficulty in walking, not elsewhere classified  Cervicalgia  Cramp and spasm     Problem List Patient Active Problem List   Diagnosis Date Noted  . Chronic pain syndrome 06/20/2020  . Post laminectomy syndrome 06/20/2020  . Sjogren's disease ([place]) 05/23/2020  . Primary osteoarthritis of both hands 05/23/2020  . Primary osteoarthritis of both feet 05/23/2020  . Sacroiliitis, not elsewhere classified ([place]) 12/22/2019  . Other secondary scoliosis, lumbar region 12/22/2019  . Spondylosis without myelopathy or radiculopathy, lumbar region 12/22/2019  . Cervical radiculopathy 10/18/2019  . Numbness 09/21/2019  . Bilateral carpal tunnel syndrome 09/21/2019  . High  risk medication use 09/21/2019  . Memory loss 09/21/2019  . Essential hypertension 08/25/2018  . Abnormal SPEP 02/19/2018  . Hand pain 02/20/2017  . Multiple joint pain 02/18/2017  . Right elbow pain 12/09/2016  . Disturbed cognition 06/25/2016  . Trochanteric bursitis of left hip 02/18/2016  . Sciatica, right side 10/30/2015  . Bilateral arm pain 10/10/2015  . Trochanteric bursitis of both hips 08/04/2014  . Chronic fatigue 08/04/2014  . Urinary frequency 08/04/2014  . Abnormality of gait 08/04/2014  . Unspecified visual disturbance 01/31/2013  . Transient vision disturbance 01/31/2013  . Routine general medical examination at a health care facility 11/04/2011  . Colon polyps 11/03/2011  . Hot flashes, menopausal 10/13/2011  . POSTHERPETIC NEURALGIA 10/03/2009  . DISPLCMT LUMBAR  INTERVERT [place] W/O MYELOPATHY 09/05/2009  . RENAL CALCULUS, RECURRENT 09/04/2009  . Hyperlipidemia 08/31/2009  . MULTIPLE SCLEROSIS, PROGRESSIVE/RELAPSING 08/31/2009  . ALLERGIC RHINITIS 08/31/2009    Carney Living PT DPT  08/01/2020, 9:06 AM  [facility] [address] [place], [place], [address] Phone: [phone]   Fax:  [phone]  Name: DARLYS DEBRUYN MRN: [id number] Date of Birth: Jan 01, 1946

## 2020-08-07 ENCOUNTER — Ambulatory Visit: Payer: Medicare Other | Admitting: Physical Therapy

## 2020-08-13 ENCOUNTER — Other Ambulatory Visit: Payer: Self-pay

## 2020-08-13 ENCOUNTER — Ambulatory Visit: Payer: Medicare Other | Admitting: Physical Therapy

## 2020-08-13 ENCOUNTER — Encounter: Payer: Self-pay | Admitting: Physical Therapy

## 2020-08-13 DIAGNOSIS — M542 Cervicalgia: Secondary | ICD-10-CM

## 2020-08-13 DIAGNOSIS — R252 Cramp and spasm: Secondary | ICD-10-CM

## 2020-08-13 DIAGNOSIS — G8929 Other chronic pain: Secondary | ICD-10-CM | POA: Diagnosis not present

## 2020-08-13 DIAGNOSIS — M545 Low back pain, unspecified: Secondary | ICD-10-CM | POA: Diagnosis not present

## 2020-08-13 DIAGNOSIS — R262 Difficulty in walking, not elsewhere classified: Secondary | ICD-10-CM

## 2020-08-13 DIAGNOSIS — M6281 Muscle weakness (generalized): Secondary | ICD-10-CM | POA: Diagnosis not present

## 2020-08-13 NOTE — Therapy (Signed)
[place], [place], [address] Phone: [phone]   Fax:  [phone]  Physical Therapy Treatment  Patient Details  Name: Dawn [name] MRN: [id number] Date of Birth: 12/14/1945 Referring Provider (PT): Dr Christell Constant   Encounter Date: 08/13/2020   PT End of Session - 08/13/20 1302    Visit Number 15    Number of Visits 58    Date for PT Re-Evaluation 09/03/20    Authorization Type progress note done on 68 th visit Next at 68    PT Start Time 1145    PT Stop Time 1233    PT Time Calculation (min) 48 min    Activity Tolerance Patient tolerated treatment well    Behavior During Therapy [facility] for tasks assessed/performed           Past Medical History:  Diagnosis Date  . Allergy   . Dry eyes   . Endometrial polyp   . Essential hypertension 08/25/2018  . GERD (gastroesophageal reflux disease)   . History of kidney stones   . Hot flashes, menopausal 10/13/2011   Estradiol started   . Jaundice as teenager   no problems since  . Memory loss 09/21/2019     1/2 of feet numb all the time  . Multiple sclerosis ([place]) dx 2001  . Neuropathy   . Osteoarthritis   . Sjogren's syndrome ([place]) dx oct 2021   sore muscvles, dry mouth and eyes  . Vision abnormalities     Past Surgical History:  Procedure Laterality Date  . ABDOMINAL HYSTERECTOMY  2002   partial  . colonscopy  2011  . DILATATION & CURETTAGE/HYSTEROSCOPY WITH MYOSURE N/A 06/08/2020   Procedure: DILATATION & CURETTAGE/HYSTEROSCOPY/Polypectomy WITH MYOSURE;  Surgeon: Armandina Stammer, DO;  Location: [facility];  Service: Gynecology;  Laterality: N/A;  . LUMBAR FUSION  2001  . UPPER GI ENDOSCOPY  yrs ago    There were no vitals filed for this visit.   Subjective Assessment - 08/13/20 1151    Subjective Patient has been having epsisodes of significant right hip pain. She is not having any pain at this time but shwhen she does  its significant.    How long can you stand comfortably? hurts as soon as she stands. Has to shift frequently    How long can you walk comfortably? limited community ambualtion    Diagnostic tests Nothing recent    Currently in Pain? No/denies    Multiple Pain Sites No                             [facility] Adult PT Treatment/Exercise - 08/13/20 0001      Neuro Re-ed    Neuro Re-ed Details  narrow base on sair-ex 3x45 dsec hold eyes closed min a on air-ex 3x1 min;      Lumbar Exercises: Stretches   Piriformis Stretch 2 reps;20 seconds    Piriformis Stretch Limitations 3x20 sec holdleft only today      Lumbar Exercises: Supine   Pelvic Tilt Limitations x20 with breating    Clam Limitations red band 2x10    Other Supine Lumbar Exercises bridge x20; supine march in pain free range 2x10 each leg;    Other Supine Lumbar Exercises supine double knee to chest with breathing 3x10; supine ball press 3x10 with breathing      Manual Therapy   Joint Mobilization posterior glide on bolster for  flexion and inferior glide grad II and III;    Soft tissue mobilization soft tissue mobilization to lateral hips with roller to anterior hip; trigger point release to anterior hip. Several spots found    Manual Traction LAD Grade II-III            Trigger Point Dry Needling - 08/13/20 0001    Consent Given? Yes    Other Dry Needling 3 spots in piriformis    Piriformis Response Twitch response elicited;Palpable increased muscle length                PT Education - 08/13/20 1154    Education Details HEP and symptom management    Person(s) Educated Patient    Methods Explanation;Demonstration;Tactile cues;Verbal cues    Comprehension Verbalized understanding;Returned demonstration;Verbal cues required;Tactile cues required            PT Short Term Goals - 03/28/20 1446      PT SHORT TERM GOAL #1   Title Patient will report no pain radiating into anterior thigh    Baseline  no    Time 4    Period Weeks    Status Achieved    Target Date 12/09/19      PT SHORT TERM GOAL #2   Title Patient will increase bilateral LE strength to 4/5    Baseline 4+/5 gross    Time 4    Period Weeks    Status Achieved      PT SHORT TERM GOAL #3   Title Patient will increase passive right hip flexion to 105 degrrees    Baseline 100 today with pain onthe right side    Time 4    Period Weeks    Status On-going      PT SHORT TERM GOAL #4   Title Patient will increase right cervical rotation by 10 degrees    Baseline to 58 degres today    Time 4    Period Weeks    Status On-going    Target Date 01/09/20      PT SHORT TERM GOAL #5   Title Patient will increase ross bilateral UE strength to 4+/5    Baseline improved to 4/5    Time 4    Period Weeks    Status On-going             PT Long Term Goals - 04/30/20 1430      PT LONG TERM GOAL #1   Title Patient will stand for 30 min without self reported increase pain in order to stand at a kitchen sink.    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     Baseline pain at edn range hip flexion and ir but intensity decreased    Time 6    Period Weeks    Status On-going      PT LONG TERM GOAL #4   Title Patient will reach behind  his head without pain in order to perfrom ADL's    Baseline mild pain and tightness but improved    Time 6    Period Weeks    Status On-going      PT LONG TERM GOAL #5   Title Patient will report no radicular pain into her hands    Baseline more intermittent    Time 6    Period Weeks    Status  On-going      PT LONG TERM GOAL #6   Title POatient will improve ABC score to 50% function in order to decrease her risk of fall    Baseline 40%    Period Weeks    Status On-going                 Plan - 08/13/20 1307    Clinical Impression Statement Patient continues to have pain in her hip. She felt with manual therapy and needling of her piriformis, She was able to stand and walk better after treatment. She was also able to eperform balance exercises without flairing up her hip.    Personal Factors and Comorbidities Comorbidity 1;Comorbidity 2    Examination-Activity Limitations Locomotion Level;Carry;Stand;Lift;Stairs;Squat    Examination-Participation Restrictions Meal Prep;Cleaning;Community Activity;Laundry;Shop    Stability/Clinical Decision Making Stable/Uncomplicated    Clinical Decision Making Low    Rehab Potential Good    PT Frequency 1x / week    PT Duration 8 weeks    PT Treatment/Interventions ADLs/Self Care Home Management;Electrical Stimulation;Cryotherapy;Iontophoresis 4mg /ml Dexamethasone;Moist Heat;Traction;DME Instruction;Neuromuscular re-education;Patient/family education;Manual techniques;Passive range of motion;Taping    PT Next Visit Plan continue to work on [place]; hamstring stretch, tennis ball trigger point release ; abdominal breathing;Access Code: [email or web address].Seated Upper Trapezius Stretch - 1 x daily - 7 x weekly - 1 sets - 3 reps - 20 hold.Gentle Levator Scapulae Stretch - 1 x daily - 7 x weekly - 1 sets - 3 reps - 20 hold.Shoulder extension with resistance - Neutral - 1 x daily - 7 x weekly - 3 sets - 10 reps.Scapular Retraction with Resistance - 1 x daily - 7 x weekly - 3 sets - 10 reps    Consulted and Agree with Plan of Care Patient           Patient will benefit from skilled therapeutic intervention in order to improve the following deficits and impairments:  Abnormal gait,Difficulty walking,Decreased  range of motion,Decreased mobility,Decreased strength,Postural dysfunction,Pain,Increased muscle spasms  Visit Diagnosis: Chronic bilateral low back pain without sciatica  Muscle weakness (generalized)  Difficulty in walking, not elsewhere classified  Cervicalgia  Cramp and spasm     Problem List Patient Active Problem List   Diagnosis Date Noted  . Chronic pain syndrome 06/20/2020  . Post laminectomy syndrome 06/20/2020  . Sjogren's disease ([place]) 05/23/2020  . Primary osteoarthritis of both hands 05/23/2020  . Primary osteoarthritis of both feet 05/23/2020  . Sacroiliitis, not elsewhere classified ([place]) 12/22/2019  . Other secondary scoliosis, lumbar region 12/22/2019  . Spondylosis without myelopathy or radiculopathy, lumbar region 12/22/2019  . Cervical radiculopathy 10/18/2019  . Numbness 09/21/2019  . Bilateral carpal tunnel syndrome 09/21/2019  . High risk medication use 09/21/2019  . Memory loss 09/21/2019  . Essential hypertension 08/25/2018  . Abnormal SPEP 02/19/2018  . Hand pain 02/20/2017  . Multiple joint pain 02/18/2017  . Right elbow pain 12/09/2016  . Disturbed cognition 06/25/2016  . Trochanteric bursitis of left hip 02/18/2016  . Sciatica,  right side 10/30/2015  . Bilateral arm pain 10/10/2015  . Trochanteric bursitis of both hips 08/04/2014  . Chronic fatigue 08/04/2014  . Urinary frequency 08/04/2014  . Abnormality of gait 08/04/2014  . Unspecified visual disturbance 01/31/2013  . Transient vision disturbance 01/31/2013  . Routine general medical examination at a health care facility 11/04/2011  . Colon polyps 11/03/2011  . Hot flashes, menopausal 10/13/2011  . POSTHERPETIC NEURALGIA 10/03/2009  . DISPLCMT LUMBAR INTERVERT [place] W/O MYELOPATHY 09/05/2009  . RENAL CALCULUS, RECURRENT 09/04/2009  . Hyperlipidemia 08/31/2009  . MULTIPLE SCLEROSIS, PROGRESSIVE/RELAPSING 08/31/2009  . ALLERGIC RHINITIS 08/31/2009    Carney Living PT DPT   08/13/2020, 1:25 PM  [place], [place], [address] Phone: [phone]   Fax:  [phone]  Name: TONIESHA ZELLNER MRN: [id number] Date of Birth: 08/28/1945

## 2020-08-20 ENCOUNTER — Other Ambulatory Visit: Payer: Self-pay

## 2020-08-20 ENCOUNTER — Encounter: Payer: Self-pay | Admitting: Physical Therapy

## 2020-08-20 ENCOUNTER — Ambulatory Visit: Payer: Medicare Other | Admitting: Physical Therapy

## 2020-08-20 DIAGNOSIS — G8929 Other chronic pain: Secondary | ICD-10-CM | POA: Diagnosis not present

## 2020-08-20 DIAGNOSIS — M6281 Muscle weakness (generalized): Secondary | ICD-10-CM

## 2020-08-20 DIAGNOSIS — R252 Cramp and spasm: Secondary | ICD-10-CM | POA: Diagnosis not present

## 2020-08-20 DIAGNOSIS — R262 Difficulty in walking, not elsewhere classified: Secondary | ICD-10-CM | POA: Diagnosis not present

## 2020-08-20 DIAGNOSIS — M545 Low back pain, unspecified: Secondary | ICD-10-CM | POA: Diagnosis not present

## 2020-08-20 DIAGNOSIS — M542 Cervicalgia: Secondary | ICD-10-CM | POA: Diagnosis not present

## 2020-08-20 NOTE — Therapy (Signed)
[place], [place], [address] Phone: [phone]   Fax:  [phone]  Physical Therapy Treatment  Patient Details  Name: Dawn [name] MRN: [id number] Date of Birth: 11-Jun-1946 Referring Provider (PT): Dr Christell Constant   Encounter Date: 08/20/2020   PT End of Session - 08/20/20 1515    Visit Number 50    Number of Visits 49    Date for PT Re-Evaluation 09/03/20    Authorization Type progress note done on 57 th visit Next at 35    PT Start Time 1145    PT Stop Time 1226    PT Time Calculation (min) 41 min    Equipment Utilized During Treatment Gait belt    Activity Tolerance Patient tolerated treatment well    Behavior During Therapy [facility] for tasks assessed/performed           Past Medical History:  Diagnosis Date  . Allergy   . Dry eyes   . Endometrial polyp   . Essential hypertension 08/25/2018  . GERD (gastroesophageal reflux disease)   . History of kidney stones   . Hot flashes, menopausal 10/13/2011   Estradiol started   . Jaundice as teenager   no problems since  . Memory loss 09/21/2019     1/2 of feet numb all the time  . Multiple sclerosis ([place]) dx 2001  . Neuropathy   . Osteoarthritis   . Sjogren's syndrome ([place]) dx oct 2021   sore muscvles, dry mouth and eyes  . Vision abnormalities     Past Surgical History:  Procedure Laterality Date  . ABDOMINAL HYSTERECTOMY  2002   partial  . colonscopy  2011  . DILATATION & CURETTAGE/HYSTEROSCOPY WITH MYOSURE N/A 06/08/2020   Procedure: DILATATION & CURETTAGE/HYSTEROSCOPY/Polypectomy WITH MYOSURE;  Surgeon: Armandina Stammer, DO;  Location: [facility];  Service: Gynecology;  Laterality: N/A;  . LUMBAR FUSION  2001  . UPPER GI ENDOSCOPY  yrs ago    There were no vitals filed for this visit.   Subjective Assessment - 08/20/20 1029    Subjective Patient feels like her right hip has been painful. She has been able to do  more of her ADL's    Pertinent History MS    How long can you stand comfortably? hurts as soon as she stands. Has to shift frequently    How long can you walk comfortably? limited community ambualtion    Currently in Pain? Yes    Pain Score 3     Pain Location Hip    Pain Orientation Right    Pain Descriptors / Indicators Aching    Pain Type Chronic pain    Pain Radiating Towards down lateral leg    Pain Frequency Constant    Aggravating Factors  stanidng    Pain Relieving Factors rest    Effect of Pain on Daily Activities difficulty walking    Multiple Pain Sites No                             [facility] Adult PT Treatment/Exercise - 08/20/20 0001      Lumbar Exercises: Standing   Heel Raises 20 reps    Other Standing Lumbar Exercises right march with focus on pain free range      Lumbar Exercises: Seated   LAQ on Chair Weights (lbs) x20 red band    Other Seated Lumbar Exercises seated clamshell with band  2x10 red       Lumbar Exercises: Supine   Other Supine Lumbar Exercises bridge x20; supine march in pain free range 2x10 each leg;      Manual Therapy   Manual therapy comments skilled palaption of trigger points    Joint Mobilization posterior glide on bolster for flexion and inferior glide grad II and III;    Soft tissue mobilization soft tissue mobilization to lateral hips with roller to anterior hip; trigger point release to anterior hip. Several spots found    Manual Traction LAD Grade II-III                  PT Education - 08/20/20 1515    Education Details HEP and symptom management    Person(s) Educated Patient    Methods Explanation;Demonstration;Tactile cues;Verbal cues    Comprehension Verbalized understanding;Returned demonstration;Verbal cues required;Tactile cues required            PT Short Term Goals - 03/28/20 1446      PT SHORT TERM GOAL #1   Title Patient will report no pain radiating into anterior thigh    Baseline no     Time 4    Period Weeks    Status Achieved    Target Date 12/09/19      PT SHORT TERM GOAL #2   Title Patient will increase bilateral LE strength to 4/5    Baseline 4+/5 gross    Time 4    Period Weeks    Status Achieved      PT SHORT TERM GOAL #3   Title Patient will increase passive right hip flexion to 105 degrrees    Baseline 100 today with pain onthe right side    Time 4    Period Weeks    Status On-going      PT SHORT TERM GOAL #4   Title Patient will increase right cervical rotation by 10 degrees    Baseline to 58 degres today    Time 4    Period Weeks    Status On-going    Target Date 01/09/20      PT SHORT TERM GOAL #5   Title Patient will increase ross bilateral UE strength to 4+/5    Baseline improved to 4/5    Time 4    Period Weeks    Status On-going             PT Long Term Goals - 04/30/20 1430      PT LONG TERM GOAL #1   Title Patient will stand for 30 min without self reported increase pain in order to stand at a kitchen sink.    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     Baseline pain at edn range hip flexion and ir but intensity decreased    Time 6    Period Weeks    Status On-going  PT LONG TERM GOAL #4   Title Patient will reach behind his head without pain in order to perfrom ADL's    Baseline mild pain and tightness but improved    Time 6    Period Weeks    Status On-going      PT LONG TERM GOAL #5   Title Patient will report no radicular pain into her hands    Baseline more intermittent    Time 6    Period Weeks    Status  On-going      PT LONG TERM GOAL #6   Title POatient will improve ABC score to 50% function in order to decrease her risk of fall    Baseline 40%    Period Weeks    Status On-going                 Plan - 08/20/20 1516    Clinical Impression Statement Patient was able to perfrom incfreased exercise today. She is having less pain in her right hip over the past week. We continue to needle her piriformis and perfrom manyual therapy to her right hip. We will continue to progress her towards higher level stability and strength exercises as her hip allows.    Personal Factors and Comorbidities Comorbidity 1;Comorbidity 2    Comorbidities MS, OA    Examination-Activity Limitations Locomotion Level;Carry;Stand;Lift;Stairs;Squat    Examination-Participation Restrictions Meal Prep;Cleaning;Community Activity;Laundry;Shop    Stability/Clinical Decision Making Stable/Uncomplicated    Clinical Decision Making Low    Rehab Potential Good    PT Frequency 1x / week    PT Duration 8 weeks    PT Treatment/Interventions ADLs/Self Care Home Management;Electrical Stimulation;Cryotherapy;Iontophoresis 4mg /ml Dexamethasone;Moist Heat;Traction;DME Instruction;Neuromuscular re-education;Patient/family education;Manual techniques;Passive range of motion;Taping    PT Next Visit Plan continue to work on [place]; hamstring stretch, tennis ball trigger point release ; abdominal breathing;Access Code: [email or web address].Seated Upper Trapezius Stretch - 1 x daily - 7 x weekly - 1 sets - 3 reps - 20 hold.Gentle Levator Scapulae Stretch - 1 x daily - 7 x weekly - 1 sets - 3 reps - 20 hold.Shoulder extension with resistance - Neutral - 1 x daily - 7 x weekly - 3 sets - 10 reps.Scapular Retraction with Resistance - 1 x daily - 7 x weekly - 3 sets - 10 reps    Consulted and Agree with Plan of Care Patient           Patient will benefit from skilled therapeutic intervention in order to  improve the following deficits and impairments:  Abnormal gait,Difficulty walking,Decreased range of motion,Decreased mobility,Decreased strength,Postural dysfunction,Pain,Increased muscle spasms  Visit Diagnosis: Chronic bilateral low back pain without sciatica  Muscle weakness (generalized)  Difficulty in walking, not elsewhere classified  Cervicalgia  Cramp and spasm     Problem List Patient Active Problem List   Diagnosis Date Noted  . Chronic pain syndrome 06/20/2020  . Post laminectomy syndrome 06/20/2020  . Sjogren's disease ([place]) 05/23/2020  . Primary osteoarthritis of both hands 05/23/2020  . Primary osteoarthritis of both feet 05/23/2020  . Sacroiliitis, not elsewhere classified ([place]) 12/22/2019  . Other secondary scoliosis, lumbar region 12/22/2019  . Spondylosis without myelopathy or radiculopathy, lumbar region 12/22/2019  . Cervical radiculopathy 10/18/2019  . Numbness 09/21/2019  . Bilateral carpal tunnel syndrome 09/21/2019  . High risk medication use 09/21/2019  . Memory loss 09/21/2019  . Essential hypertension 08/25/2018  . Abnormal SPEP 02/19/2018  . Hand pain  02/20/2017  . Multiple joint pain 02/18/2017  . Right elbow pain 12/09/2016  . Disturbed cognition 06/25/2016  . Trochanteric bursitis of left hip 02/18/2016  . Sciatica, right side 10/30/2015  . Bilateral arm pain 10/10/2015  . Trochanteric bursitis of both hips 08/04/2014  . Chronic fatigue 08/04/2014  . Urinary frequency 08/04/2014  . Abnormality of gait 08/04/2014  . Unspecified visual disturbance 01/31/2013  . Transient vision disturbance 01/31/2013  . Routine general medical examination at a health care facility 11/04/2011  . Colon polyps 11/03/2011  . Hot flashes, menopausal 10/13/2011  . POSTHERPETIC NEURALGIA 10/03/2009  . DISPLCMT LUMBAR INTERVERT [place] W/O MYELOPATHY 09/05/2009  . RENAL CALCULUS, RECURRENT 09/04/2009  . Hyperlipidemia 08/31/2009  . MULTIPLE SCLEROSIS,  PROGRESSIVE/RELAPSING 08/31/2009  . ALLERGIC RHINITIS 08/31/2009    Carney Living PT DPT  08/20/2020, 3:24 PM  [facility] [address] [place], [place], [address] Phone: [phone]   Fax:  [phone]  Name: BRANDIE LOPES MRN: [id number] Date of Birth: February 11, 1946

## 2020-08-27 ENCOUNTER — Ambulatory Visit: Payer: Medicare Other | Admitting: Physical Therapy

## 2020-08-27 DIAGNOSIS — G35 Multiple sclerosis: Secondary | ICD-10-CM | POA: Diagnosis not present

## 2020-08-27 DIAGNOSIS — I70293 Other atherosclerosis of native arteries of extremities, bilateral legs: Secondary | ICD-10-CM | POA: Diagnosis not present

## 2020-08-27 DIAGNOSIS — L602 Onychogryphosis: Secondary | ICD-10-CM | POA: Diagnosis not present

## 2020-08-27 DIAGNOSIS — M6281 Muscle weakness (generalized): Secondary | ICD-10-CM | POA: Diagnosis not present

## 2020-09-03 ENCOUNTER — Ambulatory Visit: Payer: Medicare Other | Admitting: Physical Therapy

## 2020-09-06 ENCOUNTER — Telehealth: Payer: Self-pay

## 2020-09-06 ENCOUNTER — Other Ambulatory Visit: Payer: Self-pay

## 2020-09-06 ENCOUNTER — Ambulatory Visit (INDEPENDENT_AMBULATORY_CARE_PROVIDER_SITE_OTHER): Payer: Medicare Other | Admitting: Family

## 2020-09-06 ENCOUNTER — Encounter: Payer: Self-pay | Admitting: Family

## 2020-09-06 DIAGNOSIS — Z0000 Encounter for general adult medical examination without abnormal findings: Secondary | ICD-10-CM | POA: Diagnosis not present

## 2020-09-06 DIAGNOSIS — Z1159 Encounter for screening for other viral diseases: Secondary | ICD-10-CM

## 2020-09-06 DIAGNOSIS — Z23 Encounter for immunization: Secondary | ICD-10-CM | POA: Diagnosis not present

## 2020-09-06 DIAGNOSIS — Z780 Asymptomatic menopausal state: Secondary | ICD-10-CM

## 2020-09-06 MED ORDER — TETANUS-DIPHTH-ACELL PERTUSSIS 5-2.5-18.5 LF-MCG/0.5 IM SUSP: 0.5000 mL | Freq: Once | INTRAMUSCULAR | 0 refills | Status: AC

## 2020-09-06 NOTE — Telephone Encounter (Signed)
Ms. Dawn [name], Dawn [name] are scheduled for a virtual visit with your provider today.    Just as we do with appointments in the office, we must obtain your consent to participate.  Your consent will be active for this visit and any virtual visit you may have with one of our providers in the next 365 days.    If you have a MyChart account, I can also send a copy of this consent to you electronically.  All virtual visits are billed to your insurance company just like a traditional visit in the office.  As this is a virtual visit, video technology does not allow for your provider to perform a traditional examination.  This may limit your provider's ability to fully assess your condition.  If your provider identifies any concerns that need to be evaluated in person or the need to arrange testing such as labs, EKG, etc, we will make arrangements to do so.    Although advances in technology are sophisticated, we cannot ensure that it will always work on either your end or our end.  If the connection with a video visit is poor, we may have to switch to a telephone visit.  With either a video or telephone visit, we are not always able to ensure that we have a secure connection.   I need to obtain your verbal consent now.   Are you willing to proceed with your visit today?   Dawn [name] has provided verbal consent on 09/06/2020 for a virtual visit (video or telephone).   [place], [place] 09/06/2020  9:30 AM

## 2020-09-06 NOTE — Patient Instructions (Signed)
Dawn [name] , Thank you for taking time to come for your Medicare Wellness Visit. I appreciate your ongoing commitment to your health goals. Please review the following plan we discussed and let me know if I can assist you in the future.   Screening recommendations/referrals: Colonoscopy - Upto date  Mammogram :  upcoming appointment  Bone Density : Order this visit  Recommended yearly ophthalmology/optometry visit for glaucoma screening and checkup Recommended yearly dental visit for hygiene and checkup  Vaccinations: Influenza vaccine: Up to date  Pneumococcal vaccine : Up to date  Tdap vaccine: Please Tdap vaccine at your pharmacy  Shingles vaccine : Up to date    Advanced directives: Yes   Conditions/risks identified: Advance Age female > 44 Yrs,Obesity BMI > 30 ,Hx of smoking ,Hypertension,Family history of premature Cardiovascular disease  Next appointment: 1 year   Preventive Care 73 Years and Older, Female Preventive care refers to lifestyle choices and visits with your health care provider that can promote health and wellness. What does preventive care include?  A yearly physical exam. This is also called an annual well check.  Dental exams once or twice a year.  Routine eye exams. Ask your health care provider how often you should have your eyes checked.  Personal lifestyle choices, including:  Daily care of your teeth and gums.  Regular physical activity.  Eating a healthy diet.  Avoiding tobacco and drug use.  Limiting alcohol use.  Practicing safe sex.  Taking low-dose aspirin every day.  Taking vitamin and mineral supplements as recommended by your health care provider. What happens during an annual well check? The services and screenings done by your health care provider during your annual well check will depend on your age, overall health, lifestyle risk factors, and family history of disease. Counseling  Your health care provider may ask you  questions about your:  Alcohol use.  Tobacco use.  Drug use.  Emotional well-being.  Home and relationship well-being.  Sexual activity.  Eating habits.  History of falls.  Memory and ability to understand (cognition).  Work and work Statistician.  Reproductive health. Screening  You may have the following tests or measurements:  Height, weight, and BMI.  Blood pressure.  Lipid and cholesterol levels. These may be checked every 5 years, or more frequently if you are over 73 years old.  Skin check.  Lung cancer screening. You may have this screening every year starting at age 73 if you have a 30-pack-year history of smoking and currently smoke or have quit within the past 15 years.  Fecal occult blood test (FOBT) of the stool. You may have this test every year starting at age 73.  Flexible sigmoidoscopy or colonoscopy. You may have a sigmoidoscopy every 5 years or a colonoscopy every 10 years starting at age 73.  Hepatitis C blood test.  Hepatitis B blood test.  Sexually transmitted disease (STD) testing.  Diabetes screening. This is done by checking your blood sugar (glucose) after you have not eaten for a while (fasting). You may have this done every 1-3 years.  Bone density scan. This is done to screen for osteoporosis. You may have this done starting at age 73.  Mammogram. This may be done every 1-2 years. Talk to your health care provider about how often you should have regular mammograms. Talk with your health care provider about your test results, treatment options, and if necessary, the need for more tests. Vaccines  Your health care provider may recommend certain  vaccines, such as:  Influenza vaccine. This is recommended every year.  Tetanus, diphtheria, and acellular pertussis (Tdap, Td) vaccine. You may need a Td booster every 10 years.  Zoster vaccine. You may need this after age 73.  Pneumococcal 13-valent conjugate (PCV13) vaccine. One dose is  recommended after age 73.  Pneumococcal polysaccharide (PPSV23) vaccine. One dose is recommended after age 73. Talk to your health care provider about which screenings and vaccines you need and how often you need them. This information is not intended to replace advice given to you by your health care provider. Make sure you discuss any questions you have with your health care provider. Document Released: 08/03/2015 Document Revised: 03/26/2016 Document Reviewed: 05/08/2015 Elsevier Interactive Patient Education  2017 [place] Prevention in the Home Falls can cause injuries. They can happen to people of all ages. There are many things you can do to make your home safe and to help prevent falls. What can I do on the outside of my home?  Regularly fix the edges of walkways and driveways and fix any cracks.  Remove anything that might make you trip as you walk through a door, such as a raised step or threshold.  Trim any bushes or trees on the path to your home.  Use bright outdoor lighting.  Clear any walking paths of anything that might make someone trip, such as rocks or tools.  Regularly check to see if handrails are loose or broken. Make sure that both sides of any steps have handrails.  Any raised decks and porches should have guardrails on the edges.  Have any leaves, snow, or ice cleared regularly.  Use sand or salt on walking paths during winter.  Clean up any spills in your garage right away. This includes oil or grease spills. What can I do in the bathroom?  Use night lights.  Install grab bars by the toilet and in the tub and shower. Do not use towel bars as grab bars.  Use non-skid mats or decals in the tub or shower.  If you need to sit down in the shower, use a plastic, non-slip stool.  Keep the floor dry. Clean up any water that spills on the floor as soon as it happens.  Remove soap buildup in the tub or shower regularly.  Attach bath mats  securely with double-sided non-slip rug tape.  Do not have throw rugs and other things on the floor that can make you trip. What can I do in the bedroom?  Use night lights.  Make sure that you have a light by your bed that is easy to reach.  Do not use any sheets or blankets that are too big for your bed. They should not hang down onto the floor.  Have a firm chair that has side arms. You can use this for support while you get dressed.  Do not have throw rugs and other things on the floor that can make you trip. What can I do in the kitchen?  Clean up any spills right away.  Avoid walking on wet floors.  Keep items that you use a lot in easy-to-reach places.  If you need to reach something above you, use a strong step stool that has a grab bar.  Keep electrical cords out of the way.  Do not use floor polish or wax that makes floors slippery. If you must use wax, use non-skid floor wax.  Do not have throw rugs and other things  on the floor that can make you trip. What can I do with my stairs?  Do not leave any items on the stairs.  Make sure that there are handrails on both sides of the stairs and use them. Fix handrails that are broken or loose. Make sure that handrails are as long as the stairways.  Check any carpeting to make sure that it is firmly attached to the stairs. Fix any carpet that is loose or worn.  Avoid having throw rugs at the top or bottom of the stairs. If you do have throw rugs, attach them to the floor with carpet tape.  Make sure that you have a light switch at the top of the stairs and the bottom of the stairs. If you do not have them, ask someone to add them for you. What else can I do to help prevent falls?  Wear shoes that:  Do not have high heels.  Have rubber bottoms.  Are comfortable and fit you well.  Are closed at the toe. Do not wear sandals.  If you use a stepladder:  Make sure that it is fully opened. Do not climb a closed  stepladder.  Make sure that both sides of the stepladder are locked into place.  Ask someone to hold it for you, if possible.  Clearly mark and make sure that you can see:  Any grab bars or handrails.  First and last steps.  Where the edge of each step is.  Use tools that help you move around (mobility aids) if they are needed. These include:  Canes.  Walkers.  Scooters.  Crutches.  Turn on the lights when you go into a dark area. Replace any light bulbs as soon as they burn out.  Set up your furniture so you have a clear path. Avoid moving your furniture around.  If any of your floors are uneven, fix them.  If there are any pets around you, be aware of where they are.  Review your medicines with your doctor. Some medicines can make you feel dizzy. This can increase your chance of falling. Ask your doctor what other things that you can do to help prevent falls. This information is not intended to replace advice given to you by your health care provider. Make sure you discuss any questions you have with your health care provider. Document Released: 05/03/2009 Document Revised: 12/13/2015 Document Reviewed: 08/11/2014 Elsevier Interactive Patient Education  2017 [employer].

## 2020-09-06 NOTE — Progress Notes (Signed)
This service is provided via telemedicine  No vital signs collected/recorded due to the encounter was a telemedicine visit.   Location of patient (ex: home, work): Home.  Patient consents to a telephone visit: Yes.  Location of the provider (ex: office, home): [facility].   Name of any referring provider: Gayland Curry, DO   Names of all persons participating in the telemedicine service and their role in the encounter: Patient, Heriberto Antigua, [place], [place], Webb Silversmith, NP.    Time spent on call: 8 minutes spent on the phone with Medical Assistant.     Subjective:   Dawn [name] is a 75 y.o. female who presents for Medicare Annual (Subsequent) preventive examination.  Review of Systems    Cardiac Risk Factors include: advanced age (>77mn, >>28women);obesity (BMI >30kg/m2);smoking/ tobacco exposure;hypertension;family history of premature cardiovascular disease     Objective:    Today's Vitals   09/06/20 0958  PainSc: 7    There is no height or weight on file to calculate BMI.  Advanced Directives 09/06/2020 06/08/2020 05/21/2020 12/20/2019 10/17/2019 08/30/2019 04/26/2019  Does Patient Have a Medical Advance Directive? _0  Yes Yes  Type of Advance Directive Living will Healthcare Power of Attorney;_1  [place]  Does patient want to make changes to medical advance directive? No - Patient declined No - Patient declined No - Patient declined No - Patient declined No - Patient declined No - Patient declined -  Copy of [place]? - No - copy requested - - - - Yes - validated most recent copy scanned in chart (See row information)    Current Medications (verified) Outpatient Encounter Medications as of 09/06/2020  Medication Sig  . acetaminophen (TYLENOL) 500 MG tablet Take 500 mg by mouth as needed.  .[name] Kitchenaspirin EC 81 MG tablet Take 81 mg by  mouth daily.  . calcium carbonate (OS-CAL) 600 MG TABS Take 600 mg by mouth daily.  . clonazePAM (KLONOPIN) 0.5 MG tablet Take 1 tablet by mouth at bedtime  . fexofenadine (ALLEGRA) 180 MG tablet Take 180 mg by mouth at bedtime.   .[name] Kitchenleflunomide (ARAVA) 20 MG tablet Take 1 tablet (20 mg total) by mouth daily.  .[name] Kitchenlosartan (COZAAR) 50 MG tablet Take 1 tablet (50 mg total) by mouth daily.  . Misc Natural Products (SUPER-D3+) 5000 units CAPS Take by mouth daily.  . modafinil (PROVIGIL) 200 MG tablet Take 200 mg by mouth every morning.  .[name] Kitchenomega-3 acid ethyl esters (LOVAZA) 1 g capsule Take by mouth daily.  .[name] Kitchenomeprazole (PRILOSEC) 40 MG capsule TAKE 1 CAPSULE(40 MG) BY MOUTH DAILY  . OVER THE COUNTER MEDICATION Act mouth rinse bid  . Polyethylene Glycol 3350 (MIRALAX PO) Take by mouth daily as needed.   . solifenacin (VESICARE) 5 MG tablet TAKE 1 TABLET BY MOUTH EVERY DAY  . traMADol (ULTRAM) 50 MG tablet Take 50 mg by mouth at bedtime as needed.  . Turmeric 500 MG CAPS Take by mouth daily.  . [DISCONTINUED] Tdap (BOOSTRIX) 5-2.5-18.5 LF-MCG/0.5 injection Inject 0.5 mLs into the muscle once.  . Tdap (BOOSTRIX) 5-2.5-18.5 LF-MCG/0.5 injection Inject 0.5 mLs into the muscle once for 1 dose.  . [DISCONTINUED] acetaminophen (TYLENOL) 500 MG tablet Take 2 tablets (1,000 mg total) by mouth in the morning and at bedtime. (Patient taking differently: No sig reported)  . [DISCONTINUED] estradiol (CLIMARA - DOSED IN  MG/24 HR) 0.025 mg/24hr patch Place 0.025 mg onto the skin once a week.   . [DISCONTINUED] modafinil (PROVIGIL) 200 MG tablet TAKE 1 TABLET EVERY MORNING AND ONE-HALF TABLET EVERY AFTERNOON (Patient taking differently: TAKE 1 TABLET EVERY MORNING)  . [DISCONTINUED] traMADol (ULTRAM) 50 MG tablet Take 1 tablet (50 mg total) by mouth at bedtime. (Patient taking differently: Take 50 mg by mouth as needed. )   No facility-administered encounter medications on file as of 09/06/2020.    Allergies  (verified) Patient has no known allergies.   History: Past Medical History:  Diagnosis Date  . Allergy   . Dry eyes   . Endometrial polyp   . Essential hypertension 08/25/2018  . GERD (gastroesophageal reflux disease)   . History of kidney stones   . Hot flashes, menopausal 10/13/2011   Estradiol started   . Jaundice as teenager   no problems since  . Memory loss 09/21/2019     1/2 of feet numb all the time  . Multiple sclerosis ([place]) dx 2001  . Neuropathy   . Osteoarthritis   . Sjogren's syndrome ([place]) dx oct 2021   sore muscvles, dry mouth and eyes  . Vision abnormalities    Past Surgical History:  Procedure Laterality Date  . ABDOMINAL HYSTERECTOMY  2002   partial  . colonscopy  2011  . DILATATION & CURETTAGE/HYSTEROSCOPY WITH MYOSURE N/A 06/08/2020   Procedure: DILATATION & CURETTAGE/HYSTEROSCOPY/Polypectomy WITH MYOSURE;  Surgeon: Armandina Stammer, DO;  Location: [facility];  Service: Gynecology;  Laterality: N/A;  . LUMBAR FUSION  2001  . UPPER GI ENDOSCOPY  yrs ago   Family History  Problem Relation Age of Onset  . Heart failure Mother   . Heart failure Father   . Arthritis Other   . Hypertension Other    Social History   Socioeconomic History  . Marital status: Widowed    Spouse name: Not on file  . Number of children: Not on file  . Years of education: Not on file  . Highest education level: Not on file  Occupational History  . Occupation: retired  Tobacco Use  . Smoking status: Former Smoker    Packs/day: 0.50    Years: 15.00    Pack years: 7.50    Types: Cigarettes    Quit date: 08/05/1983    Years since quitting: 37.1  . Smokeless tobacco: Never Used  Vaping Use  . Vaping Use: Never used  Substance and Sexual Activity  . Alcohol use: Yes    Alcohol/week: 0.0 standard drinks    Comment: rarely  . Drug use: No  . Sexual activity: Not Currently    Birth control/protection: Post-menopausal  Other Topics Concern  . 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     Tobacco use, amount per day now: former   Past tobacco use, amount per day: 1/2-1 packet   How many years did you use tobacco: 12 years   Alcohol use (drinks per week): 0   Diet: mediterranean based   Do you drink/eat things with caffeine: yes   Marital status:        widow  What year were you married? 1987   Do you live in a house, apartment, assisted living, condo, trailer, etc.? house   Is it one or more stories? 1 story   How many persons live in your home? 1   Do you have pets in your home?( please list) no   Current or past profession: Science writer    Do you exercise?          yes                        Type and how often? Tai chi and pt therapy, stretches etc....   Do you have a living will? yes   Do you have a DNR form?      no                             If not, do you want to discuss one? yes   Do you have signed POA/HPOA forms?      no                  If so, please bring to you appointment   Social Determinants of Health   Financial Resource Strain: Not on file  Food Insecurity: Not on file  Transportation Needs: Not on file  Physical Activity: Not on file  Stress: Not on file  Social Connections: Not on file    Tobacco Counseling Counseling given: Not Answered   Clinical Intake:  Pre-visit preparation completed: No  Pain : 0-10 Pain Score: 7  Pain Type: Chronic pain Pain Location: Back (Hip) Pain Orientation: Lower (bilateral hip) Pain Radiating Towards: no Pain Descriptors / Indicators: Aching Pain Onset: Other (comment) (several years) Pain Frequency: Constant Pain Relieving Factors: Tramadol Effect of Pain on Daily Activities:  walking,standing  Pain Relieving Factors: Tramadol  BMI - recorded: 32.51 Nutritional Status: BMI > 30  Obese Nutritional Risks: None Diabetes: No  How often do you need to have someone help you when you read instructions, pamphlets, or other written materials from your doctor or pharmacy?: 1 - Never What is the last grade level you completed in school?: Graduate school  Diabetic?No   Interpreter Needed?: No  Information entered by :: Jazlen Ogarro FNP-C   Activities of Daily Living In your present state of health, do you have any difficulty performing the following activities: 09/06/2020 06/08/2020  Hearing? N N  Vision? N N  Difficulty concentrating or making decisions? N N  Walking or climbing stairs? Y N  Comment ambulates with walker -  Dressing or bathing? N N  Doing errands, shopping? N -  Preparing Food and eating ? N -  Using the Toilet? N -  In the past six months, have you accidently leaked urine? N -  Do you have problems with loss of bowel control? N -  Managing your Medications? N -  Managing your Finances? N -  Housekeeping or managing your Housekeeping? N -  Some recent data might be hidden    Patient Care Team: Gayland Curry, DO as PCP - General (Geriatric Medicine) [name], MD as Consulting Physician (Orthopedic Surgery) [name], [name], MD (Neurology) [name], [name], Tina (Optometry) [name], NP as Nurse Practitioner (Obstetrics and Gynecology) [name], MD as Consulting Physician (Dermatology)  Indicate any recent Medical Services you may have received from other than [facility] providers in the past year (date  may be approximate).     Assessment:   This is a routine wellness examination for [facility].  Hearing/Vision screen  Hearing Screening   '125Hz'$  $[place]'250Hz'aRJXU$'500Hz'$'1000Hz'$'2000Hz'$'3000Hz'$'4000Hz'$'6000Hz'$'8000Hz'$   Right ear:           Left ear:           Comments: No Hearing Concerns.   Vision Screening Comments: No Vision  Concerns.  Dietary issues and exercise activities discussed: Current Exercise Habits: Home exercise routine, Type of exercise: Other - see comments (swimming), Time (Minutes): 30, Frequency (Times/Week): 2, Weekly Exercise (Minutes/Week): 60, Intensity: Mild, Exercise limited by: Other - see comments (MS and chonic pain)  Goals    . Exercise 150 min/wk Moderate Activity     Stay active as much possible       Depression Screen PHQ 2/9 Scores 09/06/2020 10/17/2019 09/02/2019 08/30/2019 12/06/2018 08/30/2018  PHQ - 2 Score 0 0 0 0 0 0    Fall Risk Fall Risk  09/06/2020 05/21/2020 12/20/2019 10/17/2019 08/30/2019  Falls in the past year? 0 0 0 0 0  Number falls in past yr: 0 0 0 0 0  Injury with Fall? 0 0 0 0 0  Risk for fall due to : - - - - -  Risk for fall due to: Comment - - - - -  Follow up - - - - -    [place]:  Any stairs in or around the home? Yes  If so, are there any without handrails? Yes  Home free of loose throw rugs in walkways, pet beds, electrical cords, etc? No  Adequate lighting in your home to reduce risk of falls? Yes   ASSISTIVE DEVICES UTILIZED TO PREVENT FALLS:  Life alert? No  Use of a cane, walker or w/c? Yes  Grab bars in the bathroom? Yes  Shower chair or bench in shower? Yes  Elevated toilet seat or a handicapped toilet? No   TIMED UP AND GO:  Was the test performed? No .  Length of time to ambulate 10 feet: N/A sec.   Gait slow and steady with assistive device  Cognitive Function:     6CIT Screen 09/06/2020 08/30/2019  What Year? 0 points 0 points  What month? 0 points 0 points  What time? 0 points 0 points  Count back from 20 0 points 0 points  Months in reverse 0 points 0 points  Repeat phrase 0 points 0 points  Total Score 0 0    Immunizations Immunization History  Administered Date(s) Administered  . Fluad Quad(high Dose 65+) 04/01/2019  . Influenza Whole 05/14/2011  . Influenza, High Dose Seasonal PF  04/30/2017, 05/15/2018, 05/18/2020  . PFIZER(Purple Top)SARS-COV-2 Vaccination 08/27/2019, 09/21/2019, 03/07/2020  . Pneumococcal Conjugate-13 08/30/2018  . Pneumococcal Polysaccharide-23 03/21/2009, 04/20/2020  . Td 12/04/2009  . Zoster 11/29/2009    TDAP status: Due, Education has been provided regarding the importance of this vaccine. Advised may receive this vaccine at local pharmacy or Health Dept. Aware to provide a copy of the vaccination record if obtained from local pharmacy or Health Dept. Verbalized acceptance and understanding.  Flu Vaccine status: Up to date  Pneumococcal vaccine status: Up to date  Covid-19 vaccine status: Completed vaccines  Qualifies for Shingles Vaccine? Yes   Zostavax completed No   Shingrix Completed?: No.    Education has been provided regarding the importance of this vaccine. Patient has been advised to call insurance company to  determine out of pocket expense if they have not yet received this vaccine. Advised may also receive vaccine at local pharmacy or Health Dept. Verbalized acceptance and understanding.  Screening Tests Health Maintenance  Topic Date Due  . TETANUS/TDAP  12/05/2019  . DEXA SCAN  10/16/2020 (Originally 12/20/2012)  . Hepatitis C Screening  10/16/2020 (Originally 24-Jan-1946)  . Fecal DNA (Cologuard)  09/28/2021  . MAMMOGRAM  03/14/2022  . INFLUENZA VACCINE  Completed  . COVID-19 Vaccine  Completed  . PNA vac Low Risk Adult  Completed    Health Maintenance  Health Maintenance Due  Topic Date Due  . TETANUS/TDAP  12/05/2019    Colorectal cancer screening: No longer required.   Mammogram status: Completed 03/14/2020 . Repeat every year  Bone Density status: Ordered 09/06/2020 . Pt provided with contact info and advised to call to schedule appt.  Lung Cancer Screening: (Low Dose CT Chest recommended if Age 24-80 years, 30 pack-year currently smoking OR have quit w/in 15years.) does not qualify.   Lung Cancer Screening  Referral: No   Additional Screening:  Hepatitis C Screening: does qualify; Completed No   Vision Screening: Recommended annual ophthalmology exams for early detection of glaucoma and other disorders of the eye. Is the patient up to date with their annual eye exam?  Yes  Who is the provider or what is the name of the office in which the patient attends annual eye exams? Dr.DeMaco  If pt is not established with a provider, would they like to be referred to a provider to establish care? No .   Dental Screening: Recommended annual dental exams for proper oral hygiene  Community Resource Referral / Chronic Care Management: CRR required this visit?  No   CCM required this visit?  No      Plan:   - Tdap vaccine  - Hep C  - Dexa Scan   I have personally reviewed and noted the following in the patient's chart:   . Medical and social history . Use of alcohol, tobacco or illicit drugs  . Current medications and supplements . Functional ability and status . Nutritional status . Physical activity . Advanced directives . List of other physicians . Hospitalizations, surgeries, and ER visits in previous 12 months . Vitals . Screenings to include cognitive, depression, and falls . Referrals and appointments  In addition, I have reviewed and discussed with patient certain preventive protocols, quality metrics, and best practice recommendations. A written personalized care plan for preventive services as well as general preventive health recommendations were provided to patient.     Sandrea Hughs, NP   09/06/2020   Nurse Notes: Advised to get her Tdap vaccine at her Pharmacy

## 2020-09-07 ENCOUNTER — Other Ambulatory Visit: Payer: Self-pay | Admitting: Obstetrics and Gynecology

## 2020-09-07 ENCOUNTER — Other Ambulatory Visit: Payer: Self-pay

## 2020-09-07 ENCOUNTER — Ambulatory Visit: Payer: Medicare Other | Attending: Neurology | Admitting: Physical Therapy

## 2020-09-07 ENCOUNTER — Encounter: Payer: Self-pay | Admitting: Physical Therapy

## 2020-09-07 DIAGNOSIS — G8929 Other chronic pain: Secondary | ICD-10-CM

## 2020-09-07 DIAGNOSIS — R252 Cramp and spasm: Secondary | ICD-10-CM | POA: Diagnosis not present

## 2020-09-07 DIAGNOSIS — M542 Cervicalgia: Secondary | ICD-10-CM | POA: Diagnosis not present

## 2020-09-07 DIAGNOSIS — M6281 Muscle weakness (generalized): Secondary | ICD-10-CM | POA: Insufficient documentation

## 2020-09-07 DIAGNOSIS — M545 Low back pain, unspecified: Secondary | ICD-10-CM | POA: Diagnosis not present

## 2020-09-07 DIAGNOSIS — R262 Difficulty in walking, not elsewhere classified: Secondary | ICD-10-CM

## 2020-09-07 DIAGNOSIS — Z1231 Encounter for screening mammogram for malignant neoplasm of breast: Secondary | ICD-10-CM

## 2020-09-08 ENCOUNTER — Encounter: Payer: Self-pay | Admitting: Physical Therapy

## 2020-09-08 NOTE — Therapy (Signed)
[place], [place], [address] Phone: [phone]   Fax:  [phone]  Physical Therapy Treatment  Patient Details  Name: Dawn [name] MRN: [id number] Date of Birth: June 09, 1946 Referring Provider (PT): Dr Christell Constant   Encounter Date: 09/07/2020     Past Medical History:  Diagnosis Date  . Allergy   . Dry eyes   . Endometrial polyp   . Essential hypertension 08/25/2018  . GERD (gastroesophageal reflux disease)   . History of kidney stones   . Hot flashes, menopausal 10/13/2011   Estradiol started   . Jaundice as teenager   no problems since  . Memory loss 09/21/2019     1/2 of feet numb all the time  . Multiple sclerosis ([place]) dx 2001  . Neuropathy   . Osteoarthritis   . Sjogren's syndrome ([place]) dx oct 2021   sore muscvles, dry mouth and eyes  . Vision abnormalities     Past Surgical History:  Procedure Laterality Date  . ABDOMINAL HYSTERECTOMY  2002   partial  . colonscopy  2011  . DILATATION & CURETTAGE/HYSTEROSCOPY WITH MYOSURE N/A 06/08/2020   Procedure: DILATATION & CURETTAGE/HYSTEROSCOPY/Polypectomy WITH MYOSURE;  Surgeon: Armandina Stammer, DO;  Location: [facility];  Service: Gynecology;  Laterality: N/A;  . LUMBAR FUSION  2001  . UPPER GI ENDOSCOPY  yrs ago    There were no vitals filed for this visit.   Subjective Assessment - 09/09/20 1320    Subjective Patient's right side has been flaired up for a few weeks now. She has significant pain on the right side that has limited her mobility.    Pertinent History MS    How long can you stand comfortably? hurts as soon as she stands. Has to shift frequently    How long can you walk comfortably? limited community ambualtion    Diagnostic tests Nothing recent    Currently in Pain? Yes    Pain Score 7     Pain Location Hip    Pain Orientation Right    Pain Descriptors / Indicators Aching    Pain Type Chronic pain     Pain Onset Other (comment)    Pain Frequency Constant    Aggravating Factors  standing    Pain Relieving Factors tramadol    Effect of Pain on Daily Activities walking and standing              [facility] PT Assessment - 09/09/20 0001      AROM   Overall AROM Comments pain with end range right hip flexion and IR      Strength   Right Shoulder Flexion 4+/5    Right Shoulder Internal Rotation 4+/5    Right Shoulder External Rotation 4+/5    Left Shoulder Flexion 4+/5    Left Shoulder External Rotation 4+/5    Right Hip Flexion 4/5    Right Hip ABduction 4/5    Right Hip ADduction 4/5    Left Hip ABduction 4+/5                         [facility] Adult PT Treatment/Exercise - 09/09/20 0001      Manual Therapy   Manual therapy comments skilled palaption of trigger points    Joint Mobilization posterior glide on bolster for flexion and inferior glide grad II and III;    Soft tissue mobilization soft tissue mobilization to lateral hips with roller  to anterior hip; trigger point release to anterior hip. Several spots found    Manual Traction LAD Grade II-III            Trigger Point Dry Needling - 09/09/20 0001    Consent Given? Yes    Other Dry Needling 3 spots in piriformis 2 spots in gluteal    Gluteus Medius Response Twitch response elicited    Piriformis Response Twitch response elicited;Palpable increased muscle length                  PT Short Term Goals - 03/28/20 1446      PT SHORT TERM GOAL #1   Title Patient will report no pain radiating into anterior thigh    Baseline no    Time 4    Period Weeks    Status Achieved    Target Date 12/09/19      PT SHORT TERM GOAL #2   Title Patient will increase bilateral LE strength to 4/5    Baseline 4+/5 gross    Time 4    Period Weeks    Status Achieved      PT SHORT TERM GOAL #3   Title Patient will increase passive right hip flexion to 105 degrrees    Baseline 100 today with pain onthe right  side    Time 4    Period Weeks    Status On-going      PT SHORT TERM GOAL #4   Title Patient will increase right cervical rotation by 10 degrees    Baseline to 58 degres today    Time 4    Period Weeks    Status On-going    Target Date 01/09/20      PT SHORT TERM GOAL #5   Title Patient will increase ross bilateral UE strength to 4+/5    Baseline improved to 4/5    Time 4    Period Weeks    Status On-going             PT Long Term Goals - 04/30/20 1430      PT LONG TERM GOAL #1   Title Patient will stand for 30 min without self reported increase pain in order to stand at a kitchen sink.    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     Baseline pain at edn range hip flexion and ir but intensity decreased    Time 6    Period Weeks    Status On-going      PT LONG TERM GOAL #4   Title Patient will reach behind his head without pain in order to perfrom ADL's    Baseline mild pain and tightness but improved    Time 6    Period Weeks    Status On-going      PT LONG TERM GOAL #5   Title Patient will report no radicular pain into her hands  Baseline more intermittent    Time 6    Period Weeks    Status On-going      PT LONG TERM GOAL #6   Title POatient will improve ABC score to 50% function in order to decrease her risk of fall    Baseline 40%    Period Weeks    Status On-going                 Plan - 09/09/20 1319    Clinical Impression Statement Patient continues to be limited by right hip pain. She has improved pain after treatment but the pain continues to come  back. She continues to try to work on her exercises in the pool and at home but is having little change. She will be seeing different MD's coming up to look at her hip. We focused on needling and manual therapy. She reported a significant improvement in pain after her treatment. If her high levels of pain persisit she may require a follow up with MD. Patient in past re-assessments had been making steady i,provements in motion and pain. on this assessment she had a decrease in strength and motion. We will continue 1W8 to see if we can help decrease pain during this current exacerbation of symptoms.    Personal Factors and Comorbidities Comorbidity 1;Comorbidity 2    Comorbidities MS, OA    Examination-Activity Limitations Locomotion Level;Carry;Stand;Lift;Stairs;Squat    Examination-Participation Restrictions Meal Prep;Cleaning;Community Activity;Laundry;Shop    Stability/Clinical Decision Making Stable/Uncomplicated    Rehab Potential Good    PT Frequency 1x / week    PT Duration 8 weeks    PT Treatment/Interventions ADLs/Self Care Home Management;Electrical Stimulation;Cryotherapy;Iontophoresis 4mg /ml Dexamethasone;Moist Heat;Traction;DME Instruction;Neuromuscular re-education;Patient/family education;Manual techniques;Passive range of motion;Taping    PT Next Visit Plan continue to work on [place]; hamstring stretch, tennis ball trigger point release ; abdominal breathing;Access Code: [email or web address].Seated Upper Trapezius Stretch - 1 x daily - 7 x weekly - 1 sets - 3 reps - 20 hold.Gentle Levator Scapulae Stretch - 1 x daily - 7 x weekly - 1 sets - 3 reps - 20 hold.Shoulder extension with resistance - Neutral - 1 x daily - 7 x weekly - 3 sets - 10 reps.Scapular Retraction with Resistance - 1 x daily - 7 x weekly - 3 sets - 10 reps    Consulted and Agree with Plan of Care Patient           Patient will benefit from skilled therapeutic intervention in order to  improve the following deficits and impairments:  Abnormal gait,Difficulty walking,Decreased range of motion,Decreased mobility,Decreased strength,Postural dysfunction,Pain,Increased muscle spasms  Visit Diagnosis: Chronic bilateral low back pain without sciatica  Muscle weakness (generalized)  Difficulty in walking, not elsewhere classified  Cervicalgia  Cramp and spasm     Problem List Patient Active Problem List   Diagnosis Date Noted  . Chronic pain syndrome 06/20/2020  . Post laminectomy syndrome 06/20/2020  . Sjogren's disease ([place]) 05/23/2020  . Primary osteoarthritis of both hands 05/23/2020  . Primary osteoarthritis of both feet 05/23/2020  . Sacroiliitis, not elsewhere classified ([place]) 12/22/2019  . Other secondary scoliosis, lumbar region 12/22/2019  . Spondylosis without myelopathy or radiculopathy, lumbar region 12/22/2019  . Cervical radiculopathy 10/18/2019  . Numbness 09/21/2019  . Bilateral carpal tunnel syndrome 09/21/2019  . High risk medication use 09/21/2019  . Memory loss 09/21/2019  . Essential hypertension 08/25/2018  . Abnormal SPEP 02/19/2018  . Hand pain 02/20/2017  .  Multiple joint pain 02/18/2017  . Right elbow pain 12/09/2016  . Disturbed cognition 06/25/2016  . Trochanteric bursitis of left hip 02/18/2016  . Sciatica, right side 10/30/2015  . Bilateral arm pain 10/10/2015  . Trochanteric bursitis of both hips 08/04/2014  . Chronic fatigue 08/04/2014  . Urinary frequency 08/04/2014  . Abnormality of gait 08/04/2014  . Unspecified visual disturbance 01/31/2013  . Transient vision disturbance 01/31/2013  . Routine general medical examination at a health care facility 11/04/2011  . Colon polyps 11/03/2011  . Hot flashes, menopausal 10/13/2011  . POSTHERPETIC NEURALGIA 10/03/2009  . DISPLCMT LUMBAR INTERVERT [place] W/O MYELOPATHY 09/05/2009  . RENAL CALCULUS, RECURRENT 09/04/2009  . Hyperlipidemia 08/31/2009  . MULTIPLE SCLEROSIS,  PROGRESSIVE/RELAPSING 08/31/2009  . ALLERGIC RHINITIS 08/31/2009    Carney Living PT DPT  09/09/2020, 1:22 PM  [facility] [address] [place], [place], [address] Phone: [phone]   Fax:  [phone]  Name: Dawn [name] MRN: [id number] Date of Birth: July 30, 1945

## 2020-09-09 ENCOUNTER — Encounter: Payer: Self-pay | Admitting: Physical Therapy

## 2020-09-10 ENCOUNTER — Encounter: Payer: Self-pay | Admitting: Internal Medicine

## 2020-09-10 ENCOUNTER — Ambulatory Visit: Payer: Medicare Other | Admitting: Physical Therapy

## 2020-09-11 ENCOUNTER — Other Ambulatory Visit: Payer: Self-pay

## 2020-09-11 ENCOUNTER — Ambulatory Visit: Payer: Medicare Other | Admitting: Physical Therapy

## 2020-09-11 DIAGNOSIS — G8929 Other chronic pain: Secondary | ICD-10-CM | POA: Diagnosis not present

## 2020-09-11 DIAGNOSIS — M542 Cervicalgia: Secondary | ICD-10-CM

## 2020-09-11 DIAGNOSIS — M6281 Muscle weakness (generalized): Secondary | ICD-10-CM | POA: Diagnosis not present

## 2020-09-11 DIAGNOSIS — R252 Cramp and spasm: Secondary | ICD-10-CM | POA: Diagnosis not present

## 2020-09-11 DIAGNOSIS — M545 Low back pain, unspecified: Secondary | ICD-10-CM

## 2020-09-11 DIAGNOSIS — R262 Difficulty in walking, not elsewhere classified: Secondary | ICD-10-CM

## 2020-09-12 ENCOUNTER — Encounter: Payer: Self-pay | Admitting: Physical Therapy

## 2020-09-12 NOTE — Therapy (Signed)
[place], [place], [address] Phone: [phone]   Fax:  [phone]  Physical Therapy Treatment  Patient Details  Name: AVERIANA CLOUATRE MRN: [id number] Date of Birth: 1945-09-20 Referring Provider (PT): Dr Christell Constant   Encounter Date: 09/11/2020   PT End of Session - 09/12/20 1311    Visit Number [id number]    Number of Visits 55    Date for PT Re-Evaluation 11/02/20    Authorization Type progress note done on 53 th visit Next at 24    PT Start Time 1145    PT Stop Time 1228    PT Time Calculation (min) 43 min    Activity Tolerance Patient tolerated treatment well    Behavior During Therapy [facility] for tasks assessed/performed           Past Medical History:  Diagnosis Date  . Allergy   . Dry eyes   . Endometrial polyp   . Essential hypertension 08/25/2018  . GERD (gastroesophageal reflux disease)   . History of kidney stones   . Hot flashes, menopausal 10/13/2011   Estradiol started   . Jaundice as teenager   no problems since  . Memory loss 09/21/2019     1/2 of feet numb all the time  . Multiple sclerosis ([place]) dx 2001  . Neuropathy   . Osteoarthritis   . Sjogren's syndrome ([place]) dx oct 2021   sore muscvles, dry mouth and eyes  . Vision abnormalities     Past Surgical History:  Procedure Laterality Date  . ABDOMINAL HYSTERECTOMY  2002   partial  . colonscopy  2011  . DILATATION & CURETTAGE/HYSTEROSCOPY WITH MYOSURE N/A 06/08/2020   Procedure: DILATATION & CURETTAGE/HYSTEROSCOPY/Polypectomy WITH MYOSURE;  Surgeon: Armandina Stammer, DO;  Location: [facility];  Service: Gynecology;  Laterality: N/A;  . LUMBAR FUSION  2001  . UPPER GI ENDOSCOPY  yrs ago    There were no vitals filed for this visit.   Subjective Assessment - 09/12/20 1309    Subjective Patient reports her hip has been feeling better. it is still there but she is sleeping and walking better. She continues to  work in the pool.    Pertinent History MS    How long can you stand comfortably? hurts as soon as she stands. Has to shift frequently    How long can you walk comfortably? limited community ambualtion    Diagnostic tests Nothing recent    Currently in Pain? Yes    Pain Score 5     Pain Location Hip    Pain Orientation Right    Pain Descriptors / Indicators Aching    Pain Type Chronic pain    Pain Onset More than a month ago    Pain Frequency Constant    Aggravating Factors  standing    Pain Relieving Factors stretching    Effect of Pain on Daily Activities walking and standing    Multiple Pain Sites No                             [facility] Adult PT Treatment/Exercise - 09/12/20 0001      Lumbar Exercises: Standing   Heel Raises 20 reps    Heel Raises Limitations weight shifting forward      Lumbar Exercises: Seated   LAQ on Chair Weights (lbs) x20 red band    Other Seated Lumbar Exercises hamstring curl 2x15  red    Other Seated Lumbar Exercises seated clamshell with band 2x10 red       Lumbar Exercises: Supine   Clam Limitations red band 2x10    Other Supine Lumbar Exercises bridge x20; supine march in pain free range 2x10 each leg;    Other Supine Lumbar Exercises supine double knee to chest with breathing 3x10; supine ball press 3x10 with breathing                  PT Education - 09/12/20 1310    Education Details reviewed base strengthening exercises    Person(s) Educated Patient    Methods Explanation;Demonstration;Tactile cues;Verbal cues    Comprehension Verbalized understanding;Returned demonstration;Verbal cues required;Tactile cues required            PT Short Term Goals - 03/28/20 1446      PT SHORT TERM GOAL #1   Title Patient will report no pain radiating into anterior thigh    Baseline no    Time 4    Period Weeks    Status Achieved    Target Date 12/09/19      PT SHORT TERM GOAL #2   Title Patient will increase bilateral  LE strength to 4/5    Baseline 4+/5 gross    Time 4    Period Weeks    Status Achieved      PT SHORT TERM GOAL #3   Title Patient will increase passive right hip flexion to 105 degrrees    Baseline 100 today with pain onthe right side    Time 4    Period Weeks    Status On-going      PT SHORT TERM GOAL #4   Title Patient will increase right cervical rotation by 10 degrees    Baseline to 58 degres today    Time 4    Period Weeks    Status On-going    Target Date 01/09/20      PT SHORT TERM GOAL #5   Title Patient will increase ross bilateral UE strength to 4+/5    Baseline improved to 4/5    Time 4    Period Weeks    Status On-going             PT Long Term Goals - 04/30/20 1430      PT LONG TERM GOAL #1   Title Patient will stand for 30 min without self reported increase pain in order to stand at a kitchen sink.    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     Baseline pain at edn range hip flexion and ir but intensity decreased    Time 6    Period Weeks    Status On-going      PT LONG TERM GOAL #4   Title Patient will reach behind his head without pain in order to perfrom ADL's    Baseline mild pain and  tightness but improved    Time 6    Period Weeks    Status On-going      PT LONG TERM GOAL #5   Title Patient will report no radicular pain into her hands    Baseline more intermittent    Time 6    Period Weeks    Status On-going      PT LONG TERM GOAL #6   Title POatient will improve ABC score to 50% function in order to decrease her risk of fall    Baseline 40%     Period Weeks    Status On-going                 Plan - 09/12/20 1312    Clinical Impression Statement Therapy was able to progress parient back into ther-ex today. She tolerated weell. We still needled her piriformis and lower lumbar paraspinals. She had decreased spasming in her gluteals and lumbar paraspinals. We will hiopefully continue to progress back into standing ther-ex and balance work.    Personal Factors and Comorbidities Comorbidity 1;Comorbidity 2    Comorbidities MS, OA    Examination-Activity Limitations Locomotion Level;Carry;Stand;Lift;Stairs;Squat    Examination-Participation Restrictions Meal Prep;Cleaning;Community Activity;Laundry;Shop    Stability/Clinical Decision Making Stable/Uncomplicated    Clinical Decision Making Low    Rehab Potential Good    PT Frequency 1x / week    PT Duration 8 weeks    PT Treatment/Interventions ADLs/Self Care Home Management;Electrical Stimulation;Cryotherapy;Iontophoresis 4mg /ml Dexamethasone;Moist Heat;Traction;DME Instruction;Neuromuscular re-education;Patient/family education;Manual techniques;Passive range of motion;Taping    PT Next Visit Plan continue to work on [place]; hamstring stretch, tennis ball trigger point release ; abdominal breathing;Access Code: [email or web address].Seated Upper Trapezius Stretch - 1 x daily - 7 x weekly - 1 sets - 3 reps - 20 hold.Gentle Levator Scapulae Stretch - 1 x daily - 7 x weekly - 1 sets - 3 reps - 20 hold.Shoulder extension with resistance - Neutral - 1 x daily - 7 x weekly - 3 sets - 10 reps.Scapular Retraction with Resistance - 1 x daily - 7 x weekly - 3 sets - 10 reps    Consulted and Agree with Plan of Care Patient           Patient will benefit from skilled therapeutic intervention in order to improve the following deficits and impairments:  Abnormal gait,Difficulty walking,Decreased range of motion,Decreased mobility,Decreased strength,Postural  dysfunction,Pain,Increased muscle spasms  Visit Diagnosis: Chronic bilateral low back pain without sciatica  Muscle weakness (generalized)  Difficulty in walking, not elsewhere classified  Cervicalgia  Cramp and spasm     Problem List Patient Active Problem List   Diagnosis Date Noted  . Chronic pain syndrome 06/20/2020  . Post laminectomy syndrome 06/20/2020  . Sjogren's disease ([place]) 05/23/2020  . Primary osteoarthritis of both hands 05/23/2020  . Primary osteoarthritis of both feet 05/23/2020  . Sacroiliitis, not elsewhere classified ([place]) 12/22/2019  . Other secondary scoliosis, lumbar region 12/22/2019  . Spondylosis without myelopathy or radiculopathy, lumbar region 12/22/2019  . Cervical radiculopathy 10/18/2019  . Numbness 09/21/2019  . Bilateral carpal tunnel syndrome 09/21/2019  . High risk medication use 09/21/2019  . Memory loss 09/21/2019  . Essential hypertension 08/25/2018  . Abnormal SPEP 02/19/2018  . Hand pain 02/20/2017  . Multiple joint pain 02/18/2017  . Right elbow pain 12/09/2016  . Disturbed cognition 06/25/2016  . Trochanteric bursitis of left hip 02/18/2016  . Sciatica, right side 10/30/2015  . Bilateral  arm pain 10/10/2015  . Trochanteric bursitis of both hips 08/04/2014  . Chronic fatigue 08/04/2014  . Urinary frequency 08/04/2014  . Abnormality of gait 08/04/2014  . Unspecified visual disturbance 01/31/2013  . Transient vision disturbance 01/31/2013  . Routine general medical examination at a health care facility 11/04/2011  . Colon polyps 11/03/2011  . Hot flashes, menopausal 10/13/2011  . POSTHERPETIC NEURALGIA 10/03/2009  . DISPLCMT LUMBAR INTERVERT [place] W/O MYELOPATHY 09/05/2009  . RENAL CALCULUS, RECURRENT 09/04/2009  . Hyperlipidemia 08/31/2009  . MULTIPLE SCLEROSIS, PROGRESSIVE/RELAPSING 08/31/2009  . ALLERGIC RHINITIS 08/31/2009    Carney Living PT DPT  09/12/2020, 1:17 PM  [facility] [place], [place], [address] Phone: [phone]   Fax:  [phone]  Name: SHAQUALA BROEKER MRN: [id number] Date of Birth: [birth year]

## 2020-09-14 ENCOUNTER — Other Ambulatory Visit: Payer: Self-pay | Admitting: Neurology

## 2020-09-17 ENCOUNTER — Ambulatory Visit: Payer: Medicare Other | Admitting: Physical Therapy

## 2020-09-17 DIAGNOSIS — M25551 Pain in right hip: Secondary | ICD-10-CM | POA: Diagnosis not present

## 2020-09-19 DIAGNOSIS — M25551 Pain in right hip: Secondary | ICD-10-CM | POA: Diagnosis not present

## 2020-09-24 ENCOUNTER — Encounter: Payer: Self-pay | Admitting: Physical Therapy

## 2020-09-24 ENCOUNTER — Other Ambulatory Visit: Payer: Self-pay

## 2020-09-24 ENCOUNTER — Ambulatory Visit: Payer: Medicare Other | Attending: Neurology | Admitting: Physical Therapy

## 2020-09-24 DIAGNOSIS — M21621 Bunionette of right foot: Secondary | ICD-10-CM | POA: Diagnosis not present

## 2020-09-24 DIAGNOSIS — R252 Cramp and spasm: Secondary | ICD-10-CM

## 2020-09-24 DIAGNOSIS — M21622 Bunionette of left foot: Secondary | ICD-10-CM | POA: Diagnosis not present

## 2020-09-24 DIAGNOSIS — G8929 Other chronic pain: Secondary | ICD-10-CM

## 2020-09-24 DIAGNOSIS — M205X1 Other deformities of toe(s) (acquired), right foot: Secondary | ICD-10-CM | POA: Diagnosis not present

## 2020-09-24 DIAGNOSIS — G609 Hereditary and idiopathic neuropathy, unspecified: Secondary | ICD-10-CM | POA: Diagnosis not present

## 2020-09-24 DIAGNOSIS — R262 Difficulty in walking, not elsewhere classified: Secondary | ICD-10-CM | POA: Diagnosis not present

## 2020-09-24 DIAGNOSIS — M6281 Muscle weakness (generalized): Secondary | ICD-10-CM | POA: Diagnosis not present

## 2020-09-24 DIAGNOSIS — M545 Low back pain, unspecified: Secondary | ICD-10-CM | POA: Diagnosis not present

## 2020-09-24 DIAGNOSIS — G629 Polyneuropathy, unspecified: Secondary | ICD-10-CM | POA: Diagnosis not present

## 2020-09-24 DIAGNOSIS — M542 Cervicalgia: Secondary | ICD-10-CM | POA: Diagnosis not present

## 2020-09-24 DIAGNOSIS — M792 Neuralgia and neuritis, unspecified: Secondary | ICD-10-CM | POA: Diagnosis not present

## 2020-09-24 NOTE — Therapy (Signed)
[place], [place], [address] Phone: [phone]   Fax:  [phone]  Physical Therapy Treatment  Patient Details  Name: Dawn [name] MRN: [id number] Date of Birth: 1946/03/20 Referring Provider (PT): Dr Christell Constant   Encounter Date: 09/24/2020   PT End of Session - 09/24/20 1038    Visit Number 60    Number of Visits 22    Date for PT Re-Evaluation 11/02/20    Authorization Type progress note done on 72 th visit Next at 56    PT Start Time 1010    PT Stop Time 1054    PT Time Calculation (min) 44 min    Activity Tolerance Patient tolerated treatment well    Behavior During Therapy [facility] for tasks assessed/performed           Past Medical History:  Diagnosis Date  . Allergy   . Dry eyes   . Endometrial polyp   . Essential hypertension 08/25/2018  . GERD (gastroesophageal reflux disease)   . History of kidney stones   . Hot flashes, menopausal 10/13/2011   Estradiol started   . Jaundice as teenager   no problems since  . Memory loss 09/21/2019     1/2 of feet numb all the time  . Multiple sclerosis ([place]) dx 2001  . Neuropathy   . Osteoarthritis   . Sjogren's syndrome ([place]) dx oct 2021   sore muscvles, dry mouth and eyes  . Vision abnormalities     Past Surgical History:  Procedure Laterality Date  . ABDOMINAL HYSTERECTOMY  2002   partial  . colonscopy  2011  . DILATATION & CURETTAGE/HYSTEROSCOPY WITH MYOSURE N/A 06/08/2020   Procedure: DILATATION & CURETTAGE/HYSTEROSCOPY/Polypectomy WITH MYOSURE;  Surgeon: Armandina Stammer, DO;  Location: [place];  Service: Gynecology;  Laterality: N/A;  . LUMBAR FUSION  2001  . UPPER GI ENDOSCOPY  yrs ago    There were no vitals filed for this visit.   Subjective Assessment - 09/24/20 1017    Subjective Patient had an injection last Monday. since the injection she has had a soignificant improvement in pain. She is having very  little pain at this time. She had a slight twinge of pain today but ntohing much.    Pertinent History MS    How long can you stand comfortably? hurts as soon as she stands. Has to shift frequently    How long can you walk comfortably? limited community ambualtion    Diagnostic tests Nothing recent    Currently in Pain? No/denies                             [facility] Adult PT Treatment/Exercise - 09/24/20 0001      Neck Exercises: Standing   Other Standing Exercises narrow base on air-ex 3x320 second hold; tandem stance on air-ex 3x20 sec each leg    Other Standing Exercises standing march 2x10; standing heel raise 2x10       Neck Exercises: Seated   Other Seated Exercise LAQ x20 each leg    Other Seated Exercise ball squaeeze with abdominal brace 2x10      Lumbar Exercises: Supine   Clam Limitations red band 2x10    Other Supine Lumbar Exercises supine march 2x10;      Manual Therapy   Soft tissue mobilization rollwer to anterior hip and trigger point release to the anterior hip  Manual Traction LAD Grade II-III                  PT Education - 09/24/20 1019    Education Details reviewed strengthening    Person(s) Educated Patient    Methods Explanation;Demonstration;Tactile cues;Verbal cues    Comprehension Verbalized understanding;Returned demonstration;Verbal cues required;Tactile cues required            PT Short Term Goals - 03/28/20 1446      PT SHORT TERM GOAL #1   Title Patient will report no pain radiating into anterior thigh    Baseline no    Time 4    Period Weeks    Status Achieved    Target Date 12/09/19      PT SHORT TERM GOAL #2   Title Patient will increase bilateral LE strength to 4/5    Baseline 4+/5 gross    Time 4    Period Weeks    Status Achieved      PT SHORT TERM GOAL #3   Title Patient will increase passive right hip flexion to 105 degrrees    Baseline 100 today with pain onthe right side    Time 4    Period  Weeks    Status On-going      PT SHORT TERM GOAL #4   Title Patient will increase right cervical rotation by 10 degrees    Baseline to 58 degres today    Time 4    Period Weeks    Status On-going    Target Date 01/09/20      PT SHORT TERM GOAL #5   Title Patient will increase ross bilateral UE strength to 4+/5    Baseline improved to 4/5    Time 4    Period Weeks    Status On-going             PT Long Term Goals - 04/30/20 1430      PT LONG TERM GOAL #1   Title Patient will stand for 30 min without self reported increase pain in order to stand at a kitchen sink.    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     Baseline pain at edn range hip flexion and ir but intensity decreased    Time 6    Period Weeks    Status On-going      PT LONG TERM GOAL #4   Title Patient will reach behind his head without pain in order to perfrom ADL's    Baseline mild pain and tightness but improved    Time 6    Period Weeks    Status On-going      PT LONG TERM GOAL #5   Title Patient will report no radicular pain into her hands    Baseline more intermittent    Time 6    Period Weeks    Status On-going  PT LONG TERM GOAL #6   Title POatient will improve ABC score to 50% function in order to decrease her risk of fall    Baseline 40%    Period Weeks    Status On-going                 Plan - 09/24/20 1038    Clinical Impression Statement The patients injection has given [place] an opporunity to focus more on strengthening. She continues to have deficits in right hip flexion. She  tolerated ther-ex well today. We will continue to progress ther-ex as tolerated.    Personal Factors and Comorbidities Comorbidity 1;Comorbidity 2    Comorbidities MS, OA    Examination-Activity Limitations Locomotion Level;Carry;Stand;Lift;Stairs;Squat    Examination-Participation Restrictions Meal Prep;Cleaning;Community Activity;Laundry;Shop    Stability/Clinical Decision Making Stable/Uncomplicated    Clinical Decision Making Low    Rehab Potential Good    PT Frequency 1x / week    PT Treatment/Interventions ADLs/Self Care Home Management;Electrical Stimulation;Cryotherapy;Iontophoresis 4mg /ml Dexamethasone;Moist Heat;Traction;DME Instruction;Neuromuscular re-education;Patient/family education;Manual techniques;Passive range of motion;Taping    PT Next Visit Plan continue to work on [place]; hamstring stretch, tennis ball trigger point release ; abdominal breathing;Access Code: [email or web address].Seated Upper Trapezius Stretch - 1 x daily - 7 x weekly - 1 sets - 3 reps - 20 hold.Gentle Levator Scapulae Stretch - 1 x daily - 7 x weekly - 1 sets - 3 reps - 20 hold.Shoulder extension with resistance - Neutral - 1 x daily - 7 x weekly - 3 sets - 10 reps.Scapular Retraction with Resistance - 1 x daily - 7 x weekly - 3 sets - 10 reps    Consulted and Agree with Plan of Care Patient           Patient will benefit from skilled therapeutic intervention in order to improve the following deficits and impairments:  Abnormal gait,Difficulty walking,Decreased range of motion,Decreased mobility,Decreased strength,Postural dysfunction,Pain,Increased muscle spasms  Visit Diagnosis: Chronic bilateral low back pain without sciatica  Muscle weakness (generalized)  Difficulty in walking, not elsewhere classified  Cervicalgia  Cramp and spasm     Problem List Patient Active Problem List   Diagnosis Date Noted  . Chronic pain syndrome 06/20/2020  . Post laminectomy  syndrome 06/20/2020  . Sjogren's disease ([place]) 05/23/2020  . Primary osteoarthritis of both hands 05/23/2020  . Primary osteoarthritis of both feet 05/23/2020  . Sacroiliitis, not elsewhere classified ([place]) 12/22/2019  . Other secondary scoliosis, lumbar region 12/22/2019  . Spondylosis without myelopathy or radiculopathy, lumbar region 12/22/2019  . Cervical radiculopathy 10/18/2019  . Numbness 09/21/2019  . Bilateral carpal tunnel syndrome 09/21/2019  . High risk medication use 09/21/2019  . Memory loss 09/21/2019  . Essential hypertension 08/25/2018  . Abnormal SPEP 02/19/2018  . Hand pain 02/20/2017  . Multiple joint pain 02/18/2017  . Right elbow pain 12/09/2016  . Disturbed cognition 06/25/2016  . Trochanteric bursitis of left hip 02/18/2016  . Sciatica, right side 10/30/2015  . Bilateral arm pain 10/10/2015  . Trochanteric bursitis of both hips 08/04/2014  . Chronic fatigue 08/04/2014  . Urinary frequency 08/04/2014  . Abnormality of gait 08/04/2014  . Unspecified visual disturbance 01/31/2013  . Transient vision disturbance 01/31/2013  . Routine general medical examination at a health care facility 11/04/2011  . Colon polyps 11/03/2011  . Hot flashes, menopausal 10/13/2011  . POSTHERPETIC NEURALGIA 10/03/2009  . DISPLCMT LUMBAR INTERVERT [place] W/O MYELOPATHY 09/05/2009  . RENAL CALCULUS, RECURRENT 09/04/2009  .  Hyperlipidemia 08/31/2009  . MULTIPLE SCLEROSIS, PROGRESSIVE/RELAPSING 08/31/2009  . ALLERGIC RHINITIS 08/31/2009    Carney Living PT DPT  09/24/2020, 3:08 PM  [facility] [address] [place], [place], [address] Phone: [phone]   Fax:  [phone]  Name: Dawn [name] MRN: [id number] Date of Birth: 09-Nov-1945

## 2020-09-25 ENCOUNTER — Other Ambulatory Visit: Payer: Self-pay | Admitting: Neurology

## 2020-10-01 ENCOUNTER — Ambulatory Visit: Payer: Medicare Other | Admitting: Physical Therapy

## 2020-10-01 ENCOUNTER — Other Ambulatory Visit: Payer: Self-pay

## 2020-10-01 ENCOUNTER — Encounter: Payer: Self-pay | Admitting: Physical Therapy

## 2020-10-01 DIAGNOSIS — M545 Low back pain, unspecified: Secondary | ICD-10-CM | POA: Diagnosis not present

## 2020-10-01 DIAGNOSIS — M6281 Muscle weakness (generalized): Secondary | ICD-10-CM

## 2020-10-01 DIAGNOSIS — R252 Cramp and spasm: Secondary | ICD-10-CM | POA: Diagnosis not present

## 2020-10-01 DIAGNOSIS — R262 Difficulty in walking, not elsewhere classified: Secondary | ICD-10-CM | POA: Diagnosis not present

## 2020-10-01 DIAGNOSIS — G8929 Other chronic pain: Secondary | ICD-10-CM

## 2020-10-01 DIAGNOSIS — G629 Polyneuropathy, unspecified: Secondary | ICD-10-CM | POA: Diagnosis not present

## 2020-10-01 DIAGNOSIS — M542 Cervicalgia: Secondary | ICD-10-CM

## 2020-10-01 NOTE — Therapy (Signed)
[place], [place], [address] Phone: [phone]   Fax:  [phone]  Physical Therapy Treatment  Patient Details  Name: Dawn [name] MRN: [id number] Date of Birth: 27-Sep-1945 Referring Provider (PT): Dr Christell Constant   Encounter Date: 10/01/2020   PT End of Session - 10/01/20 1335    Visit Number 71    Number of Visits 39    Date for PT Re-Evaluation 11/02/20    Authorization Type progress note done on 61 th visit Next at 62    PT Start Time 1015    PT Stop Time 1056    PT Time Calculation (min) 41 min    Activity Tolerance Patient tolerated treatment well    Behavior During Therapy [facility] for tasks assessed/performed           Past Medical History:  Diagnosis Date  . Allergy   . Dry eyes   . Endometrial polyp   . Essential hypertension 08/25/2018  . GERD (gastroesophageal reflux disease)   . History of kidney stones   . Hot flashes, menopausal 10/13/2011   Estradiol started   . Jaundice as teenager   no problems since  . Memory loss 09/21/2019     1/2 of feet numb all the time  . Multiple sclerosis ([place]) dx 2001  . Neuropathy   . Osteoarthritis   . Sjogren's syndrome ([place]) dx oct 2021   sore muscvles, dry mouth and eyes  . Vision abnormalities     Past Surgical History:  Procedure Laterality Date  . ABDOMINAL HYSTERECTOMY  2002   partial  . colonscopy  2011  . DILATATION & CURETTAGE/HYSTEROSCOPY WITH MYOSURE N/A 06/08/2020   Procedure: DILATATION & CURETTAGE/HYSTEROSCOPY/Polypectomy WITH MYOSURE;  Surgeon: Armandina Stammer, DO;  Location: [place];  Service: Gynecology;  Laterality: N/A;  . LUMBAR FUSION  2001  . UPPER GI ENDOSCOPY  yrs ago    There were no vitals filed for this visit.   Subjective Assessment - 10/01/20 1019    Subjective Patient reports the pain in her hip contoinues to do very well. She has teitches from time to time. She has some sorness in  her back but over all that is doing well.    Pertinent History MS    How long can you stand comfortably? hurts as soon as she stands. Has to shift frequently    How long can you walk comfortably? limited community ambualtion    Diagnostic tests Nothing recent    Currently in Pain? No/denies   a twinge at times                            [facility] Adult PT Treatment/Exercise - 10/01/20 0001      Neck Exercises: Standing   Other Standing Exercises standing march 2x10; standing heel raise 2x10       Neck Exercises: Seated   Other Seated Exercise LAQ x20 each leg    Other Seated Exercise hamstring curl 2x15 red      Neck Exercises: Supine   Other Supine Exercise gentle supine clamshell red 3x10    Other Supine Exercise supine march 3x10;      Manual Therapy   Soft tissue mobilization trigger point rleease to lumbar spine and gluteals    Manual Traction LAD Grade II-III                  PT Education -  10/01/20 1020    Education Details HEP and symptom mangement    Person(s) Educated Patient    Methods Explanation;Demonstration;Tactile cues;Verbal cues    Comprehension Verbalized understanding;Returned demonstration;Verbal cues required;Tactile cues required            PT Short Term Goals - 03/28/20 1446      PT SHORT TERM GOAL #1   Title Patient will report no pain radiating into anterior thigh    Baseline no    Time 4    Period Weeks    Status Achieved    Target Date 12/09/19      PT SHORT TERM GOAL #2   Title Patient will increase bilateral LE strength to 4/5    Baseline 4+/5 gross    Time 4    Period Weeks    Status Achieved      PT SHORT TERM GOAL #3   Title Patient will increase passive right hip flexion to 105 degrrees    Baseline 100 today with pain onthe right side    Time 4    Period Weeks    Status On-going      PT SHORT TERM GOAL #4   Title Patient will increase right cervical rotation by 10 degrees    Baseline to 58 degres  today    Time 4    Period Weeks    Status On-going    Target Date 01/09/20      PT SHORT TERM GOAL #5   Title Patient will increase ross bilateral UE strength to 4+/5    Baseline improved to 4/5    Time 4    Period Weeks    Status On-going             PT Long Term Goals - 04/30/20 1430      PT LONG TERM GOAL #1   Title Patient will stand for 30 min without self reported increase pain in order to stand at a kitchen sink.    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     Baseline pain at edn range hip flexion and ir but intensity decreased    Time 6    Period Weeks    Status On-going      PT LONG TERM GOAL #4   Title Patient will reach behind his head without pain in order to perfrom ADL's    Baseline mild pain and tightness but improved    Time 6    Period Weeks    Status On-going      PT LONG TERM GOAL #5   Title Patient will report no radicular pain into her hands    Baseline more intermittent    Time 6    Period Weeks    Status On-going      PT LONG TERM GOAL #6   Title POatient will improve ABC score to 50% function in order to decrease her risk of  fall    Baseline 40%    Period Weeks    Status On-going                 Plan - 10/01/20 1507    Clinical Impression Statement The patients right hip has done well. She continues to have very little pain. She has her normal back pain. She was able to perfrom more ther-ex today. We will continue to advance exercises as tolerated. We continues to perfrom trigger point release to her lumbar spine.    Personal Factors and  Comorbidities Comorbidity 1;Comorbidity 2    Comorbidities MS, OA    Examination-Activity Limitations Locomotion Level;Carry;Stand;Lift;Stairs;Squat    Examination-Participation Restrictions Meal Prep;Cleaning;Community Activity;Laundry;Shop    Stability/Clinical Decision Making Stable/Uncomplicated    Clinical Decision Making Low    Rehab Potential Good    PT Frequency 1x / week    PT Duration 8 weeks    PT Treatment/Interventions ADLs/Self Care Home Management;Electrical Stimulation;Cryotherapy;Iontophoresis 4mg /ml Dexamethasone;Moist Heat;Traction;DME Instruction;Neuromuscular re-education;Patient/family education;Manual techniques;Passive range of motion;Taping    PT Next Visit Plan continue to work on [place]; hamstring stretch, tennis ball trigger point release ; abdominal breathing;Access Code: [email or web address].Seated Upper Trapezius Stretch - 1 x daily - 7 x weekly - 1 sets - 3 reps - 20 hold.Gentle Levator Scapulae Stretch - 1 x daily - 7 x weekly - 1 sets - 3 reps - 20 hold.Shoulder extension with resistance - Neutral - 1 x daily - 7 x weekly - 3 sets - 10 reps.Scapular Retraction with Resistance - 1 x daily - 7 x weekly - 3 sets - 10 reps    Consulted and Agree with Plan of Care Patient           Patient will benefit from skilled therapeutic intervention in order to improve the following deficits and impairments:  Abnormal gait,Difficulty walking,Decreased range of motion,Decreased mobility,Decreased strength,Postural dysfunction,Pain,Increased muscle spasms  Visit Diagnosis: Chronic bilateral low back pain without sciatica  Muscle weakness (generalized)  Difficulty in walking, not elsewhere classified  Cervicalgia  Cramp and spasm     Problem List Patient Active Problem List   Diagnosis Date Noted  . Chronic pain syndrome 06/20/2020  . Post laminectomy syndrome 06/20/2020  . Sjogren's disease ([place]) 05/23/2020  . Primary  osteoarthritis of both hands 05/23/2020  . Primary osteoarthritis of both feet 05/23/2020  . Sacroiliitis, not elsewhere classified ([place]) 12/22/2019  . Other secondary scoliosis, lumbar region 12/22/2019  . Spondylosis without myelopathy or radiculopathy, lumbar region 12/22/2019  . Cervical radiculopathy 10/18/2019  . Numbness 09/21/2019  . Bilateral carpal tunnel syndrome 09/21/2019  . High risk medication use 09/21/2019  . Memory loss 09/21/2019  . Essential hypertension 08/25/2018  . Abnormal SPEP 02/19/2018  . Hand pain 02/20/2017  . Multiple joint pain 02/18/2017  . Right elbow pain 12/09/2016  . Disturbed cognition 06/25/2016  . Trochanteric bursitis of left hip 02/18/2016  . Sciatica, right side 10/30/2015  . Bilateral arm pain 10/10/2015  . Trochanteric bursitis of both hips 08/04/2014  . Chronic fatigue 08/04/2014  . Urinary frequency 08/04/2014  . Abnormality of gait 08/04/2014  . Unspecified visual disturbance 01/31/2013  . Transient vision disturbance 01/31/2013  . Routine general medical examination at a health care facility 11/04/2011  . Colon polyps 11/03/2011  . Hot flashes, menopausal 10/13/2011  . POSTHERPETIC NEURALGIA 10/03/2009  . DISPLCMT LUMBAR INTERVERT [place] W/O MYELOPATHY 09/05/2009  . RENAL CALCULUS, RECURRENT 09/04/2009  . Hyperlipidemia 08/31/2009  .  MULTIPLE SCLEROSIS, PROGRESSIVE/RELAPSING 08/31/2009  . ALLERGIC RHINITIS 08/31/2009    Rachael Darby DPT  10/01/2020, 3:31 PM  [facility] [address] [place], [place], [address] Phone: [phone]   Fax:  [phone]  Name: Dawn [name] MRN: [id number] Date of Birth: 1945-11-10

## 2020-10-02 ENCOUNTER — Ambulatory Visit (INDEPENDENT_AMBULATORY_CARE_PROVIDER_SITE_OTHER): Payer: Medicare Other | Admitting: Neurology

## 2020-10-02 ENCOUNTER — Ambulatory Visit: Payer: Medicare Other | Admitting: Family Medicine

## 2020-10-02 ENCOUNTER — Encounter: Payer: Self-pay | Admitting: Neurology

## 2020-10-02 VITALS — BP 171/80 | HR 77 | Ht 62.0 in | Wt 184.0 lb

## 2020-10-02 DIAGNOSIS — M545 Low back pain, unspecified: Secondary | ICD-10-CM

## 2020-10-02 DIAGNOSIS — R269 Unspecified abnormalities of gait and mobility: Secondary | ICD-10-CM

## 2020-10-02 DIAGNOSIS — G8929 Other chronic pain: Secondary | ICD-10-CM

## 2020-10-02 DIAGNOSIS — M7062 Trochanteric bursitis, left hip: Secondary | ICD-10-CM

## 2020-10-02 DIAGNOSIS — G4700 Insomnia, unspecified: Secondary | ICD-10-CM | POA: Diagnosis not present

## 2020-10-02 DIAGNOSIS — G35 Multiple sclerosis: Secondary | ICD-10-CM | POA: Diagnosis not present

## 2020-10-02 DIAGNOSIS — Z79899 Other long term (current) drug therapy: Secondary | ICD-10-CM | POA: Diagnosis not present

## 2020-10-02 DIAGNOSIS — M7061 Trochanteric bursitis, right hip: Secondary | ICD-10-CM

## 2020-10-02 MED ORDER — LEFLUNOMIDE 20 MG PO TABS
20.0000 mg | ORAL_TABLET | Freq: Every day | ORAL | 4 refills | Status: DC
Start: 2020-10-02 — End: 2021-10-02

## 2020-10-02 MED ORDER — SOLIFENACIN SUCCINATE 5 MG PO TABS
5.0000 mg | ORAL_TABLET | Freq: Every day | ORAL | 3 refills | Status: DC
Start: 2020-10-02 — End: 2021-10-07

## 2020-10-02 MED ORDER — MODAFINIL 200 MG PO TABS
200.0000 mg | ORAL_TABLET | Freq: Every morning | ORAL | 1 refills | Status: DC
Start: 2020-10-02 — End: 2021-04-23

## 2020-10-02 MED ORDER — GABAPENTIN 300 MG PO CAPS: 300.0000 mg | ORAL_CAPSULE | Freq: Every day | ORAL | 1 refills | Status: DC

## 2020-10-02 MED ORDER — CLONAZEPAM 0.5 MG PO TABS
0.5000 mg | ORAL_TABLET | Freq: Every day | ORAL | 1 refills | Status: DC
Start: 2020-10-02 — End: 2021-04-08

## 2020-10-02 NOTE — Progress Notes (Signed)
[facility]  PATIENT: Dawn [name] DOB: October 31, 1945  REFERRING CLINICIAN: Aura Dials HISTORY FROM: Patient REASON FOR VISIT: MS   HISTORICAL  CHIEF COMPLAINT:  Chief Complaint  Patient presents with   Multiple Sclerosis    RM 13, alone. Having more pain. Seeing ortho for hip pain. Doing well on leflunomide.    HISTORY OF PRESENT ILLNESS:  Dawn [name] is a 75 y.o. woman who was diagnosed with multiple sclerosis in 2001.      Update 10/02/2020: Her MS is stable without exacerbations.  She is on Leflunomide [employer] had not covered [place] well for her in the past.).  She tolerates it well.    She had surgery for uterine polyps.    Pain is worse.   She is reporting right > left hip pain. LBP and right hand pain.  She has had 2 ESI's in 2021 (Dr. Ernestina Patches). The first one helped but her last ESI did not help the LBP/leg pain long term.  RFA was also discussed with her.   She feels the trochanteric bursa injections helped more than the ESI.    PT with needling has helped.   Due to reduced ability to stand due to pain, she stopped [place] but is still doing swimming at the [facility].   She sees Orthopedics (she sees Raliegh Ip) and will be getting an MRI of the hip.     Her gait is reduced.   She gets pain with walkiced but no falls.   She uses a cane due to the right leg giving out at times.   Balance is better since PT.       She is noting numbness but less pain in her hands.  At night, she gets painful tingling in her legs.   She has some right hand pain and does some exercises with benefit.    She sees Dr. Estanislado Pandy for her arthritis and was diagnosed with Sjogren's (postive SSA).  She has a dry mouth.  Tramadol had helped her pain in the past but she rarely takes as it makes her sleepy.       She is sleeping poorly with tingling and cramps in legs and also have hot flashes.   Clonazepam helps insomnia someand spasticity at night as much as it  used to.     She feels her cognition is about the same as last visit.      She has had more memory difficulties and is forgetful.     She denies anxiety or depression at this time.   She has fatigue and takes 1 Provigil every morning and sometimes another 1/2 pill in the afternoon.   She generally eats well with a Mediterranean style diet.       _______________________________________________ MS History:    In 1994, she had an episode of severe vertigo with gait ataxia. An MRI at that time was reportedly normal. The symptoms were felt to be due to allergies. About 7 years later she had vertigo, gait ataxia and diplopia. Also her left leg was giving out. Short after that she had a hysterectomy. Postoperatively, she was numb in both legs and she had a lumbar puncture which showed changes consistent with MS. Then, an MRI of the brain was performed showing changes consistent with MS. She was referred to Dr. Erling Cruz who her on Betaseron. Last year, she switched from Betaseron to Tecfidera, in the hope that she would be able to avoid shots as she was  having severe skin reactions with knots.    However, she had a lot of difficulty tolerating Tecfidera and swithced to [place] in early 2016 and to [place] late 2016.  Switched to Leflunomide for insurance reasons in 2019.    MRI brain 09/13/2018 shows multiple T2/flair hyperintense foci in the hemispheres.  The pattern is consistent with chronic demyelinating plaque associated with multiple sclerosis though some foci likely also represent chronic microvascular ischemic change.  None of the foci enhance or appear to be acute and there is no change compared to the 2017 MRI.      Remote left cerebellar infarction.      There is a normal enhancement pattern and no acute findings.  REVIEW OF SYSTEMS:  Constitutional: No fevers, chills, sweats, or change in appetite.   Reports fatigue Eyes: No visual changes, double vision, eye pain Ear, nose and throat: No hearing  loss, ear pain, nasal congestion, sore throat Cardiovascular: No chest pain, palpitations Respiratory:  No shortness of breath at rest or with exertion.   No wheezes GastrointestinaI: No nausea, vomiting, diarrhea, abdominal pain, fecal incontinence Genitourinary:  see above. Musculoskeletal:  Pain in left greater than right hip Integumentary: No rash, pruritus.    Neurological: as above Psychiatric: No depression at this time.  No anxiety Endocrine: No palpitations, diaphoresis, change in appetite, change in weigh or increased thirst Hematologic/Lymphatic:  No anemia, purpura, petechiae. Allergic/Immunologic: No itchy/runny eyes, nasal congestion, recent allergic reactions, rashes  ALLERGIES: No Known Allergies  HOME MEDICATIONS: Outpatient Medications Prior to Visit  Medication Sig Dispense Refill   acetaminophen (TYLENOL) 500 MG tablet Take 500 mg by mouth as needed.     aspirin EC 81 MG tablet Take 81 mg by mouth daily.     calcium carbonate (OS-CAL) 600 MG TABS Take 600 mg by mouth daily.     clonazePAM (KLONOPIN) 0.5 MG tablet TAKE 1 TABLET BY MOUTH AT BEDTIME 90 tablet 1   fexofenadine (ALLEGRA) 180 MG tablet Take 180 mg by mouth at bedtime.      leflunomide (ARAVA) 20 MG tablet Take 1 tablet (20 mg total) by mouth daily. 90 tablet 4   losartan (COZAAR) 50 MG tablet Take 1 tablet (50 mg total) by mouth daily. 90 tablet 1   Misc Natural Products (SUPER-D3+) 5000 units CAPS Take by mouth daily.     modafinil (PROVIGIL) 200 MG tablet Take 200 mg by mouth every morning.     omega-3 acid ethyl esters (LOVAZA) 1 g capsule Take by mouth daily.     omeprazole (PRILOSEC) 40 MG capsule TAKE 1 CAPSULE(40 MG) BY MOUTH DAILY 90 capsule 1   OVER THE COUNTER MEDICATION Act mouth rinse bid     solifenacin (VESICARE) 5 MG tablet TAKE 1 TABLET BY MOUTH EVERY DAY 90 tablet 1   traMADol (ULTRAM) 50 MG tablet Take 50 mg by mouth at bedtime as needed.     Turmeric 500 MG CAPS Take by  mouth daily.     Polyethylene Glycol 3350 (MIRALAX PO) Take by mouth daily as needed.      No facility-administered medications prior to visit.    PAST MEDICAL HISTORY: Past Medical History:  Diagnosis Date   Allergy    Dry eyes    Endometrial polyp    Essential hypertension 08/25/2018   GERD (gastroesophageal reflux disease)    History of kidney stones    Hot flashes, menopausal 10/13/2011   Estradiol started    Jaundice as teenager   no  problems since   Memory loss 09/21/2019     1/2 of feet numb all the time   Multiple sclerosis ([place]) dx 2001   Neuropathy    Osteoarthritis    Sjogren's syndrome ([place]) dx oct 2021   sore muscvles, dry mouth and eyes   Vision abnormalities     PAST SURGICAL HISTORY: Past Surgical History:  Procedure Laterality Date   ABDOMINAL HYSTERECTOMY  2002   partial   colonscopy  2011   DILATATION & CURETTAGE/HYSTEROSCOPY WITH MYOSURE N/A 06/08/2020   Procedure: DILATATION & CURETTAGE/HYSTEROSCOPY/Polypectomy WITH MYOSURE;  Surgeon: Armandina Stammer, DO;  Location: [facility];  Service: Gynecology;  Laterality: N/A;   LUMBAR FUSION  2001   UPPER GI ENDOSCOPY  yrs ago    FAMILY HISTORY: Family History  Problem Relation Age of Onset   Heart failure Mother    Heart failure Father    Arthritis Other    Hypertension Other     SOCIAL HISTORY:  Social History   Socioeconomic History   Marital status: Widowed    Spouse name: Not on file   Number of children: Not on file   Years of education: Not on file   Highest education level: Not on file  Occupational History   Occupation: retired  Tobacco Use   Smoking status: Former Smoker    Packs/day: 0.50    Years: 15.00    Pack years: 7.50    Types: Cigarettes    Quit date: 08/05/1983    Years since quitting: 37.1   Smokeless tobacco: Never Used  Vaping Use   Vaping Use: Never used  Substance and Sexual Activity   Alcohol use: Yes     Alcohol/week: 0.0 standard drinks    Comment: rarely   Drug use: No   Sexual activity: Not Currently    Birth control/protection: Post-menopausal  Other Topics Concern   Not on file  Social History Narrative   Regular Exercise-no   Widowed   Retired   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     Tobacco use, amount per day now: former   Past tobacco use, amount per day: 1/2-1 packet   How many years did you use tobacco: 12 years   Alcohol use (drinks per week): 0   Diet: mediterranean based   Do you drink/eat things with caffeine: yes   Marital status:        widow                          What year were you married? 1987   Do you live in a house, apartment, assisted living, condo, trailer, etc.? house   Is it one or more stories? 1 story   How many persons live in your home? 1   Do you have pets in your home?( please list) no   Current or past profession: Science writer    Do you exercise?          yes                        Type and how often? Tai chi and pt therapy, stretches etc....   Do you have a living will? yes   Do you have  a DNR form?      no                             If not, do you want to discuss one? yes   Do you have signed POA/HPOA forms?      no                  If so, please bring to you appointment   Social Determinants of Health   Financial Resource Strain: Not on file  Food Insecurity: Not on file  Transportation Needs: Not on file  Physical Activity: Not on file  Stress: Not on file  Social Connections: Not on file  Intimate Partner Violence: Not on file     PHYSICAL EXAM  Vitals:   10/02/20 1523  BP: (!) 171/80  Pulse: 77  Weight: 184 lb (83.5 kg)  Height: _0  (1.575 m)    Body mass index is 33.65 kg/m.   General: The patient is well-developed and well-nourished and in  no acute distress  Musculoskeletal: She has right > left  trochanteric bursa tenderness and piriformis tenderness..     Neurologic Exam  Mental status: The patient is alert and oriented x 3 at the time of the examination.   apparently normal attention span, stm and concentration ability.   Speech is normal.      Cranial nerves: Extraocular movements are full.  Facial strength and sensory are normal.  Hearing appeared to be symmetric  Motor:  Muscle bulk is normal but tone is symmetric, more on the left.. Strength is 4/5 in median innervated and 4+/5 and ulnar innervated intrinsic hand muscles   Sensory:   Sensory testing is intact to touch and vibration in the arms.  She has reduced vibration sensation in the feet.  Coordination: Finger-nose-finger and heel-to-shin is performed better on the right than the left.  Gait and station: Station is normal.  Gait is wide and tandem gait is poor.  Romberg negative.  Reflexes: Deep tendon reflexes are symmetric and normal bilaterally.      DIAGNOSTIC DATA (LABS, IMAGING, TESTING) - I reviewed patient records, labs, notes, testing and imaging myself where available.  Lab Results  Component Value Date   WBC 5.5 06/08/2020   HGB 14.6 06/08/2020   HCT 43.0 06/08/2020   MCV 91.2 06/08/2020   PLT 213 06/08/2020      Component Value Date/Time   NA 142 06/08/2020 1042   NA 139 04/04/2020 1132   K 3.8 06/08/2020 1042   CL 107 06/08/2020 1042   CO2 22 04/04/2020 1132   GLUCOSE 86 06/08/2020 1042   BUN 24 (H) 06/08/2020 1042   BUN 24 04/04/2020 1132   CREATININE 0.60 06/08/2020 1042   CREATININE 0.75 03/09/2018 1342   CALCIUM 9.5 04/04/2020 1132   PROT 6.7 04/04/2020 1132   ALBUMIN 4.5 04/04/2020 1132   AST 38 04/04/2020 1132   AST 20 03/09/2018 1342   ALT 65 (H) 04/04/2020 1132   ALT 22 03/09/2018 1342   ALKPHOS 192 (H) 04/04/2020 1132   BILITOT 0.2 04/04/2020 1132   BILITOT 0.4 03/09/2018 1342   GFRNONAA 77 04/04/2020 1132    GFRNONAA >60 03/09/2018 1342   GFRAA 89 04/04/2020 1132   GFRAA >60 03/09/2018 1342   Lab Results  Component Value Date   CHOL 242 (H) 10/17/2019   HDL 47 (L) 10/17/2019   LDLCALC 161 (H) 10/17/2019  LDLDIRECT 130.2 11/03/2011   TRIG 187 (H) 10/17/2019   CHOLHDL 5.1 (H) 10/17/2019   No results found for: HGBA1C No results found for: VITAMINB12 Lab Results  Component Value Date   TSH 4.260 09/21/2019       ASSESSMENT AND PLAN  Multiple sclerosis ([place]) - Plan: Hepatic function panel, CBC with Differential/Platelet  High risk medication use - Plan: Hepatic function panel, CBC with Differential/Platelet  Trochanteric bursitis of both hips  Chronic bilateral low back pain without sciatica   1.    She will continue leflunomide for MS.   Recent labs were fine.    2.    Continue exercises and renew physical therapy.   3.    Continue  clonazepam and modafinil.  5.    Return to see me in about 6 months but is advised to call sooner if she has new or worsening neurologic symptoms.   Rosette Bellavance A. Felecia Shelling, MD, PhD 7/36/6815, 9:47 PM Certified in Neurology, Clinical Neurophysiology, Sleep Medicine, Pain Medicine and Neuroimaging  [facility] [address] [place], [place] [address] [phone]-' \

## 2020-10-03 LAB — CBC WITH DIFFERENTIAL/PLATELET
Basophils Absolute: 0.1 10*3/uL (ref 0.0–0.2)
Basos: 1 %
EOS (ABSOLUTE): 0.1 10*3/uL (ref 0.0–0.4)
Eos: 1 %
Hematocrit: 39.8 % (ref 34.0–46.6)
Hemoglobin: 13.4 g/dL (ref 11.1–15.9)
Immature Grans (Abs): 0.1 10*3/uL (ref 0.0–0.1)
Immature Granulocytes: 1 %
Lymphocytes Absolute: 1.1 10*3/uL (ref 0.7–3.1)
Lymphs: 11 %
MCH: 29.8 pg (ref 26.6–33.0)
MCHC: 33.7 g/dL (ref 31.5–35.7)
MCV: 88 fL (ref 79–97)
Monocytes Absolute: 0.5 10*3/uL (ref 0.1–0.9)
Monocytes: 6 %
Neutrophils Absolute: 7.6 10*3/uL — ABNORMAL HIGH (ref 1.4–7.0)
Neutrophils: 80 %
Platelets: 223 10*3/uL (ref 150–450)
RBC: 4.5 x10E6/uL (ref 3.77–5.28)
RDW: 13.6 % (ref 11.7–15.4)
WBC: 9.4 10*3/uL (ref 3.4–10.8)

## 2020-10-03 LAB — HEPATIC FUNCTION PANEL
ALT: 69 IU/L — ABNORMAL HIGH (ref 0–32)
AST: 28 IU/L (ref 0–40)
Albumin: 4.2 g/dL (ref 3.7–4.7)
Alkaline Phosphatase: 161 IU/L — ABNORMAL HIGH (ref 44–121)
Bilirubin Total: <0.2 mg/dL (ref 0.0–1.2)
Bilirubin, Direct: <0.1 mg/dL (ref 0.00–0.40)
Total Protein: 6.1 g/dL (ref 6.0–8.5)

## 2020-10-04 ENCOUNTER — Telehealth: Payer: Self-pay | Admitting: Neurology

## 2020-10-04 NOTE — Telephone Encounter (Signed)
Called pt back. Relayed Dr. Heidi Dach recommendation. She will try this and will call back if she continues to have issues on medication.

## 2020-10-04 NOTE — Telephone Encounter (Signed)
Spoke with Dr. Felecia Shelling, he recommends she try taking medication again in about a week. Make sure she has had food prior to taking.

## 2020-10-04 NOTE — Telephone Encounter (Signed)
Pt called, started taking gabapentin (NEURONTIN) 300 MG capsule and spend most of the night throwing up. Having chills, stomach ache. I am reluctant to take tonight. Would like a call from the nurse.

## 2020-10-04 NOTE — Telephone Encounter (Signed)
Called pt back. She took first dose of gabapentin 300mg  (1 capsule) last night around 10pm. She ate dinner prior about 5pm. Did not have much to eat. She had chills/was shaky for about 2 hours after taking medication. She is currently tired and stomach is upset still. She has not had much to eat/drink today. I recommended she try and eat/drink something bland to see if this helps. She denies starting any other new meds recently. She has appt at 3pm. She said it is ok to leave detailed message if she does not pick up. Aware I will be speaking w/ MD and will call back.

## 2020-10-08 ENCOUNTER — Ambulatory Visit: Payer: Medicare Other | Admitting: Physical Therapy

## 2020-10-08 ENCOUNTER — Encounter: Payer: Self-pay | Admitting: Physical Therapy

## 2020-10-08 ENCOUNTER — Other Ambulatory Visit: Payer: Self-pay

## 2020-10-08 DIAGNOSIS — M6281 Muscle weakness (generalized): Secondary | ICD-10-CM | POA: Diagnosis not present

## 2020-10-08 DIAGNOSIS — R262 Difficulty in walking, not elsewhere classified: Secondary | ICD-10-CM | POA: Diagnosis not present

## 2020-10-08 DIAGNOSIS — M545 Low back pain, unspecified: Secondary | ICD-10-CM

## 2020-10-08 DIAGNOSIS — M542 Cervicalgia: Secondary | ICD-10-CM | POA: Diagnosis not present

## 2020-10-08 DIAGNOSIS — R252 Cramp and spasm: Secondary | ICD-10-CM | POA: Diagnosis not present

## 2020-10-08 DIAGNOSIS — G8929 Other chronic pain: Secondary | ICD-10-CM

## 2020-10-08 NOTE — Therapy (Addendum)
[place], [place], [address] Phone: [phone]   Fax:  [phone]  Physical Therapy Treatment/Progress Note   Patient Details  Name: Dawn [name] MRN: [id number] Date of Birth: 1946/03/12 Referring Provider (PT): Dr Christell Constant  Progress Note Reporting Period 07/09/2021 to 10/07/2020  See note below for Objective Data and Assessment of Progress/Goals.       Encounter Date: 10/08/2020   PT End of Session - 10/08/20 0915    Visit Number [id number]    Number of Visits 38    Date for PT Re-Evaluation 11/02/20    Authorization Type progress note done on 53 th visit Next at 35    PT Start Time 0849    PT Stop Time 0930    PT Time Calculation (min) 41 min    Activity Tolerance Patient tolerated treatment well    Behavior During Therapy [facility] for tasks assessed/performed           Past Medical History:  Diagnosis Date  . Allergy   . Dry eyes   . Endometrial polyp   . Essential hypertension 08/25/2018  . GERD (gastroesophageal reflux disease)   . History of kidney stones   . Hot flashes, menopausal 10/13/2011   Estradiol started   . Jaundice as teenager   no problems since  . Memory loss 09/21/2019     1/2 of feet numb all the time  . Multiple sclerosis ([place]) dx 2001  . Neuropathy   . Osteoarthritis   . Sjogren's syndrome ([place]) dx oct 2021   sore muscvles, dry mouth and eyes  . Vision abnormalities     Past Surgical History:  Procedure Laterality Date  . ABDOMINAL HYSTERECTOMY  2002   partial  . colonscopy  2011  . DILATATION & CURETTAGE/HYSTEROSCOPY WITH MYOSURE N/A 06/08/2020   Procedure: DILATATION & CURETTAGE/HYSTEROSCOPY/Polypectomy WITH MYOSURE;  Surgeon: Armandina Stammer, DO;  Location: [facility];  Service: Gynecology;  Laterality: N/A;  . LUMBAR FUSION  2001  . UPPER GI ENDOSCOPY  yrs ago    There were no vitals filed for this visit.   Subjective Assessment -  10/08/20 0854    Subjective Patient continues to have some twitnges at times in the right hip but it has improved.    Pertinent History MS    How long can you stand comfortably? hurts as soon as she stands. Has to shift frequently    How long can you walk comfortably? limited community ambualtion    Diagnostic tests Nothing recent    Currently in Pain? No/denies   just twinges at times                            [facility] Adult PT Treatment/Exercise - 10/08/20 0001      Neck Exercises: Seated   Other Seated Exercise LAQ x20 each leg      Lumbar Exercises: Standing   Heel Raises 20 reps    Functional Squats 15 reps    Other Standing Lumbar Exercises bilateral march x20; bilateral hoip abdcution 2x10      Lumbar Exercises: Supine   Clam Limitations red band 2x10    Other Supine Lumbar Exercises supine march 2x10;      Manual Therapy   Soft tissue mobilization trigger point rleease to lumbar spine and gluteals; roller to anrior and lateral hip    Manual Traction LAD Grade II-III  PROM of the hip full flexion IR and ER without pain  Strength: right hip flexion 4/5 hip abduction 4+/5 bilateral; left hip flexion 4+/5   Decreased lateral mobility with ambulation.          PT Education - 10/08/20 0915    Education Details reviewed HEP and symptom mangement    Person(s) Educated Patient    Methods Explanation;Demonstration;Tactile cues;Verbal cues    Comprehension Verbalized understanding;Returned demonstration;Verbal cues required;Tactile cues required            PT Short Term Goals - 03/28/20 1446      PT SHORT TERM GOAL #1   Title Patient will report no pain radiating into anterior thigh    Baseline no    Time 4    Period Weeks    Status Achieved    Target Date 12/09/19      PT SHORT TERM GOAL #2   Title Patient will increase bilateral LE strength to 4/5    Baseline 4+/5 gross    Time 4    Period Weeks    Status Achieved      PT  SHORT TERM GOAL #3   Title Patient will increase passive right hip flexion to 105 degrrees    Baseline 100 today with pain onthe right side    Time 4    Period Weeks    Status On-going      PT SHORT TERM GOAL #4   Title Patient will increase right cervical rotation by 10 degrees    Baseline to 58 degres today    Time 4    Period Weeks    Status On-going    Target Date 01/09/20      PT SHORT TERM GOAL #5   Title Patient will increase ross bilateral UE strength to 4+/5    Baseline improved to 4/5    Time 4    Period Weeks    Status On-going             PT Long Term Goals - 04/30/20 1430      PT LONG TERM GOAL #1   Title Patient will stand for 30 min without self reported increase pain in order to stand at a kitchen sink.    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     Baseline pain at edn range hip flexion and ir but intensity decreased    Time 6    Period Weeks    Status On-going      PT LONG TERM GOAL #4   Title Patient will reach behind his head without pain in order to perfrom ADL's    Baseline mild pain and tightness but improved    Time 6    Period Weeks    Status On-going      PT LONG TERM GOAL #5   Title Patient will report no radicular pain into her hands    Baseline  more intermittent    Time 6    Period Weeks    Status On-going      PT LONG TERM GOAL #6   Title POatient will improve ABC score to 50% function in order to decrease her risk of fall    Baseline 40%    Period Weeks    Status On-going                 Plan - 10/08/20 0917    Clinical  Impression Statement Patient is making great progress. She tolerated ther-ex very well today/. She is havingtenderness to palpation in the anterior hip. Therapy peorfromed manual therapy to the anterior hip. Therapy iwll continue to advance exercises as tolerated.    Personal Factors and Comorbidities Comorbidity 1;Comorbidity 2    Comorbidities MS, OA    Examination-Activity Limitations Locomotion Level;Carry;Stand;Lift;Stairs;Squat    Examination-Participation Restrictions Meal Prep;Cleaning;Community Activity;Laundry;Shop    Stability/Clinical Decision Making Stable/Uncomplicated    Clinical Decision Making Low    Rehab Potential Good    PT Frequency 1x / week    PT Duration 8 weeks    PT Treatment/Interventions ADLs/Self Care Home Management;Electrical Stimulation;Cryotherapy;Iontophoresis 4mg /ml Dexamethasone;Moist Heat;Traction;DME Instruction;Neuromuscular re-education;Patient/family education;Manual techniques;Passive range of motion;Taping    PT Next Visit Plan continue to work on [place]; hamstring stretch, tennis ball trigger point release ; abdominal breathing;Access Code: [email or web address].Seated Upper Trapezius Stretch - 1 x daily - 7 x weekly - 1 sets - 3 reps - 20 hold.Gentle Levator Scapulae Stretch - 1 x daily - 7 x weekly - 1 sets - 3 reps - 20 hold.Shoulder extension with resistance - Neutral - 1 x daily - 7 x weekly - 3 sets - 10 reps.Scapular Retraction with Resistance - 1 x daily - 7 x weekly - 3 sets - 10 reps    Consulted and Agree with Plan of Care Patient           Patient will benefit from skilled therapeutic intervention in order to improve the following deficits and impairments:  Abnormal gait,Difficulty walking,Decreased range of motion,Decreased mobility,Decreased strength,Postural dysfunction,Pain,Increased muscle spasms  Visit Diagnosis: Chronic bilateral low back pain without sciatica  Muscle weakness  (generalized)  Difficulty in walking, not elsewhere classified     Problem List Patient Active Problem List   Diagnosis Date Noted  . Chronic bilateral low back pain without sciatica 10/02/2020  . Chronic pain syndrome 06/20/2020  . Post laminectomy syndrome 06/20/2020  . Sjogren's disease ([place]) 05/23/2020  . Primary osteoarthritis of both hands 05/23/2020  . Primary osteoarthritis of both feet 05/23/2020  . Sacroiliitis, not elsewhere classified ([place]) 12/22/2019  . Other secondary scoliosis, lumbar region 12/22/2019  . Spondylosis without myelopathy or radiculopathy, lumbar region 12/22/2019  . Cervical radiculopathy 10/18/2019  . Numbness 09/21/2019  . Bilateral carpal tunnel syndrome 09/21/2019  . High risk medication use 09/21/2019  . Memory loss 09/21/2019  . Essential hypertension 08/25/2018  . Abnormal SPEP 02/19/2018  . Hand pain 02/20/2017  . Multiple joint pain 02/18/2017  . Right elbow pain 12/09/2016  . Disturbed cognition 06/25/2016  . Trochanteric bursitis of left hip 02/18/2016  . Sciatica, right side 10/30/2015  . Bilateral arm pain 10/10/2015  . Trochanteric bursitis of both hips 08/04/2014  . Chronic fatigue 08/04/2014  . Urinary frequency 08/04/2014  . Abnormality of gait 08/04/2014  . Unspecified visual disturbance 01/31/2013  . Transient vision disturbance 01/31/2013  . Routine general medical examination at a health care  facility 11/04/2011  . Colon polyps 11/03/2011  . Hot flashes, menopausal 10/13/2011  . POSTHERPETIC NEURALGIA 10/03/2009  . DISPLCMT LUMBAR INTERVERT [place] W/O MYELOPATHY 09/05/2009  . RENAL CALCULUS, RECURRENT 09/04/2009  . Hyperlipidemia 08/31/2009  . MULTIPLE SCLEROSIS, PROGRESSIVE/RELAPSING 08/31/2009  . ALLERGIC RHINITIS 08/31/2009    Carney Living PT DPT  10/08/2020, 12:22 PM  [facility] [address] [place], [place], [address] Phone: [phone]   Fax:   [phone]  Name: CARLEA BADOUR MRN: [id number] Date of Birth: 1946/04/12

## 2020-10-11 ENCOUNTER — Encounter: Payer: Self-pay | Admitting: Family

## 2020-10-11 ENCOUNTER — Other Ambulatory Visit: Payer: Self-pay

## 2020-10-11 ENCOUNTER — Ambulatory Visit (INDEPENDENT_AMBULATORY_CARE_PROVIDER_SITE_OTHER): Payer: Medicare Other | Admitting: Family

## 2020-10-11 VITALS — BP 142/62 | HR 81 | Temp 97.3°F | Resp 16 | Ht 62.0 in | Wt 184.6 lb

## 2020-10-11 DIAGNOSIS — H65192 Other acute nonsuppurative otitis media, left ear: Secondary | ICD-10-CM | POA: Diagnosis not present

## 2020-10-11 MED ORDER — CIPROFLOXACIN-DEXAMETHASONE 0.3-0.1 % OT SUSP: 4.0000 [drp] | Freq: Two times a day (BID) | OTIC | 0 refills | Status: AC

## 2020-10-11 MED ORDER — AMOXICILLIN-POT CLAVULANATE 875-125 MG PO TABS: 1.0000 | ORAL_TABLET | Freq: Two times a day (BID) | ORAL | 0 refills | Status: DC

## 2020-10-11 NOTE — Progress Notes (Signed)
Provider: Marlowe Sax FNP-C  Akiel Fennell, [name], NP  Patient Care Team: Yuleidy Rappleye, [name], NP as PCP - General (Family Medicine) [name], MD as Consulting Physician (Orthopedic Surgery) [name], [name], MD (Neurology) [name], [name], [place] (Optometry) [name], NP as Nurse Practitioner (Obstetrics and Gynecology) [name], MD as Consulting Physician (Dermatology)  Extended Emergency Contact Information Primary Emergency Contact: Kathlen Mody Mobile Phone: [phone] Relation: Friend Secondary Emergency Contact: Crissie Reese Mobile Phone: [phone] Relation: Friend  Code Status:  DNR Goals of care: Advanced Directive information Advanced Directives 10/11/2020  Does Patient Have a Medical Advance Directive? Yes  Type of Advance Directive Living will  Does patient want to make changes to medical advance directive? No - Patient declined  Copy of [place] in Chart? -     Chief Complaint  Patient presents with  . Acute Visit    Patient states left ear feels swollen.    HPI:  Pt is a 75 y.o. female seen today for an acute visit for evaluation of left ear x 1 week.first started  like water was in the ear.No pain but just feels weird especially with movement of head a certain way.Has had no drainage,fever or chills.left side of throat gland feels swollen and tender to touch.  Does swim in the pool for exercise three times per week.wipes ear after swimming with alcohol and Q-tip.  Has chronic vertigo on left ear too.      Past Medical History:  Diagnosis Date  . Allergy   . Dry eyes   . Endometrial polyp   . Essential hypertension 08/25/2018  . GERD (gastroesophageal reflux disease)   . History of kidney stones   . Hot flashes, menopausal 10/13/2011   Estradiol started   . Jaundice as teenager   no problems since  . Memory loss 09/21/2019     1/2 of feet numb all the time  . Multiple sclerosis ([place]) dx 2001  .  Neuropathy   . Osteoarthritis   . Sjogren's syndrome ([place]) dx oct 2021   sore muscvles, dry mouth and eyes  . Vision abnormalities    Past Surgical History:  Procedure Laterality Date  . ABDOMINAL HYSTERECTOMY  2002   partial  . colonscopy  2011  . DILATATION & CURETTAGE/HYSTEROSCOPY WITH MYOSURE N/A 06/08/2020   Procedure: DILATATION & CURETTAGE/HYSTEROSCOPY/Polypectomy WITH MYOSURE;  Surgeon: Armandina Stammer, DO;  Location: [facility];  Service: Gynecology;  Laterality: N/A;  . LUMBAR FUSION  2001  . UPPER GI ENDOSCOPY  yrs ago    No Known Allergies  Outpatient Encounter Medications as of 10/11/2020  Medication Sig  . acetaminophen (TYLENOL) 500 MG tablet Take 500 mg by mouth as needed.  [name] Kitchen aspirin EC 81 MG tablet Take 81 mg by mouth daily.  . calcium carbonate (OS-CAL) 600 MG TABS Take 600 mg by mouth daily.  . clonazePAM (KLONOPIN) 0.5 MG tablet Take 1 tablet (0.5 mg total) by mouth at bedtime.  . fexofenadine (ALLEGRA) 180 MG tablet Take 180 mg by mouth at bedtime.   . gabapentin (NEURONTIN) 300 MG capsule Take 1 capsule (300 mg total) by mouth at bedtime.  [name] Kitchen leflunomide (ARAVA) 20 MG tablet Take 1 tablet (20 mg total) by mouth daily.  [name] Kitchen losartan (COZAAR) 50 MG tablet Take 1 tablet (50 mg total) by mouth daily.  . Misc Natural Products (SUPER-D3+) 5000 units CAPS Take by mouth daily.  . modafinil (PROVIGIL) 200 MG tablet Take 1 tablet (200  mg total) by mouth every morning.  [name] Kitchen omega-3 acid ethyl esters (LOVAZA) 1 g capsule Take by mouth daily.  [name] Kitchen omeprazole (PRILOSEC) 40 MG capsule TAKE 1 CAPSULE(40 MG) BY MOUTH DAILY  . OVER THE COUNTER MEDICATION Act mouth rinse bid  . solifenacin (VESICARE) 5 MG tablet Take 1 tablet (5 mg total) by mouth daily.  . traMADol (ULTRAM) 50 MG tablet Take 50 mg by mouth at bedtime as needed.  . Turmeric 500 MG CAPS Take by mouth daily.   No facility-administered encounter medications on file as of 10/11/2020.    Review of  Systems  Constitutional: Negative for chills, fatigue and fever.  HENT: Negative for congestion, ear pain, hearing loss, postnasal drip, rhinorrhea, sinus pressure, sinus pain, sneezing and sore throat.   Eyes: Negative for discharge, redness and itching.  Respiratory: Negative for cough, chest tightness, shortness of breath and wheezing.   Cardiovascular: Negative for chest pain, palpitations and leg swelling.  Neurological: Negative for speech difficulty, light-headedness and headaches.       Hx of vertigo     Immunization History  Administered Date(s) Administered  . Fluad Quad(high Dose 65+) 04/01/2019  . Influenza Whole 05/14/2011  . Influenza, High Dose Seasonal PF 04/30/2017, 05/15/2018, 05/18/2020  . PFIZER(Purple Top)SARS-COV-2 Vaccination 08/27/2019, 09/21/2019, 03/07/2020  . Pneumococcal Conjugate-13 08/30/2018  . Pneumococcal Polysaccharide-23 03/21/2009, 04/20/2020  . Td 12/04/2009  . Zoster 11/29/2009   Pertinent  Health Maintenance Due  Topic Date Due  . DEXA SCAN  10/16/2020 (Originally 12/20/2012)  . MAMMOGRAM  03/14/2022  . INFLUENZA VACCINE  Completed  . PNA vac Low Risk Adult  Completed   Fall Risk  10/11/2020 09/06/2020 05/21/2020 12/20/2019 10/17/2019  Falls in the past year? 0 0 0 0 0  Number falls in past yr: 0 0 0 0 0  Injury with Fall? 0 0 0 0 0  Risk for fall due to : - - - - -  Risk for fall due to: Comment - - - - -  Follow up - - - - -   Functional Status Survey:    Vitals:   10/11/20 1442  BP: (!) 142/62  Pulse: 81  Resp: 16  Temp: (!) 97.3 F (36.3 C)  SpO2: 97%  Weight: 184 lb 9.6 oz (83.7 kg)  Height: 5\' 2"  (1.575 m)   Body mass index is 33.76 kg/m. Physical Exam Vitals reviewed.  Constitutional:      General: She is not in acute distress.    Appearance: She is obese. She is not ill-appearing.  HENT:     Head: Normocephalic.     Right Ear: Tympanic membrane, ear canal and external ear normal. There is no impacted cerumen.     Ears:      Comments: Small amount of cerumen obtain using curette and alligator forceps unable to complete remove cerumen due to dryness.TM visualized Erythema with yellowish color.No tenderness reported during exam.     Nose: Nose normal. No congestion or rhinorrhea.  Neck:   Musculoskeletal:     Cervical back: Normal range of motion. No rigidity.  Neurological:     Mental Status: She is alert and oriented to person, place, and time.     Cranial Nerves: No cranial nerve deficit.     Motor: No weakness.     Gait: Gait normal.     Labs reviewed: Recent Labs    04/04/20 1132 06/08/20 1042  NA 139 142  K 4.5 3.8  CL 103 107  CO2 22  --   GLUCOSE 83 86  BUN 24 24*  CREATININE 0.77 0.60  CALCIUM 9.5  --    Recent Labs    04/04/20 1132 10/02/20 1607  AST 38 28  ALT 65* 69*  ALKPHOS 192* 161*  BILITOT 0.2 <0.2  PROT 6.7 6.1  ALBUMIN 4.5 4.2   Recent Labs    04/04/20 1132 06/08/20 1030 06/08/20 1042 10/02/20 1607  WBC 5.0 5.5  --  9.4  NEUTROABS 3.3  --   --  7.6*  HGB 13.2 13.9 14.6 13.4  HCT 40.9 42.7 43.0 39.8  MCV 92 91.2  --  88  PLT 211 213  --  223   Lab Results  Component Value Date   TSH 4.260 09/21/2019   No results found for: HGBA1C Lab Results  Component Value Date   CHOL 242 (H) 10/17/2019   HDL 47 (L) 10/17/2019   LDLCALC 161 (H) 10/17/2019   LDLDIRECT 130.2 11/03/2011   TRIG 187 (H) 10/17/2019   CHOLHDL 5.1 (H) 10/17/2019    Significant Diagnostic Results in last 30 days:  No results found.  Assessment/Plan  Other non-recurrent acute nonsuppurative otitis media of left ear Afebrile. Small amount of cerumen obtain using curette and alligator forceps unable to complete remove cerumen due to dryness.TM visualized Erythema with yellowish color.No tenderness reported during exam.  - ciprofloxacin-dexamethasone (CIPRODEX) OTIC suspension; Place 4 drops into the left ear 2 (two) times daily for 7 days.  Dispense: 7.5 mL; Refill: 0 -  amoxicillin-clavulanate (AUGMENTIN) 875-125 MG tablet; Take 1 tablet by mouth 2 (two) times daily.  Dispense: 20 tablet; Refill: 0 -Advised to avoid get head under water during swimming until she completes antibiotics. - Advised to use debrox otic solution after swimming when she resume swimming   Family/ staff Communication: Reviewed plan of care with patient verbalized understanding   Labs/tests ordered: None   Next Appointment: As needed if symptoms worsen or fail to improve.   Sandrea Hughs, NP

## 2020-10-11 NOTE — Patient Instructions (Signed)
-   Notify provider if symptoms worsen or running any fever or chills

## 2020-10-15 ENCOUNTER — Encounter: Payer: Self-pay | Admitting: Physical Therapy

## 2020-10-15 ENCOUNTER — Ambulatory Visit: Payer: Medicare Other | Admitting: Physical Therapy

## 2020-10-15 ENCOUNTER — Other Ambulatory Visit: Payer: Self-pay

## 2020-10-15 DIAGNOSIS — R262 Difficulty in walking, not elsewhere classified: Secondary | ICD-10-CM

## 2020-10-15 DIAGNOSIS — G8929 Other chronic pain: Secondary | ICD-10-CM | POA: Diagnosis not present

## 2020-10-15 DIAGNOSIS — M6281 Muscle weakness (generalized): Secondary | ICD-10-CM | POA: Diagnosis not present

## 2020-10-15 DIAGNOSIS — M542 Cervicalgia: Secondary | ICD-10-CM | POA: Diagnosis not present

## 2020-10-15 DIAGNOSIS — M545 Low back pain, unspecified: Secondary | ICD-10-CM | POA: Diagnosis not present

## 2020-10-15 DIAGNOSIS — R252 Cramp and spasm: Secondary | ICD-10-CM

## 2020-10-15 NOTE — Therapy (Addendum)
[place], [place], [address] Phone: [phone]   Fax:  [phone]  Physical Therapy Treatment  Patient Details  Name: Dawn [name] MRN: [id number] Date of Birth: [birth year] Referring Provider (PT): Dr Christell Constant   Encounter Date: 10/15/2020   PT End of Session - 10/15/20 1045    Visit Number 29    Number of Visits 46    Date for PT Re-Evaluation 11/02/20    Authorization Type progress note done on 55 th visit Next at 39    PT Start Time 1015    PT Stop Time 1057    PT Time Calculation (min) 42 min    Activity Tolerance Patient tolerated treatment well    Behavior During Therapy [facility] for tasks assessed/performed           Past Medical History:  Diagnosis Date  . Allergy   . Dry eyes   . Endometrial polyp   . Essential hypertension 08/25/2018  . GERD (gastroesophageal reflux disease)   . History of kidney stones   . Hot flashes, menopausal 10/13/2011   Estradiol started   . Jaundice as teenager   no problems since  . Memory loss 09/21/2019     1/2 of feet numb all the time  . Multiple sclerosis ([place]) dx 2001  . Neuropathy   . Osteoarthritis   . Sjogren's syndrome ([place]) dx oct 2021   sore muscvles, dry mouth and eyes  . Vision abnormalities     Past Surgical History:  Procedure Laterality Date  . ABDOMINAL HYSTERECTOMY  2002   partial  . colonscopy  2011  . DILATATION & CURETTAGE/HYSTEROSCOPY WITH MYOSURE N/A 06/08/2020   Procedure: DILATATION & CURETTAGE/HYSTEROSCOPY/Polypectomy WITH MYOSURE;  Surgeon: Armandina Stammer, DO;  Location: [facility];  Service: Gynecology;  Laterality: N/A;  . LUMBAR FUSION  2001  . UPPER GI ENDOSCOPY  yrs ago    There were no vitals filed for this visit.   Subjective Assessment - 10/15/20 1023    Subjective Patient is stiff this morning. She reports her right hip is better.    How long can you stand comfortably? hurts as soon as  she stands. Has to shift frequently    How long can you walk comfortably? limited community ambualtion    Diagnostic tests Nothing recent    Currently in Pain? No/denies   just stiff today.   Multiple Pain Sites No                             [facility] Adult PT Treatment/Exercise - 10/15/20 0001      Lumbar Exercises: Stretches   Lower Trunk Rotation Limitations x20    Piriformis Stretch 2 reps;20 seconds      Lumbar Exercises: Standing   Heel Raises 20 reps    Functional Squats 15 reps      Lumbar Exercises: Seated   LAQ on Chair Weights (lbs) x20 red band    Other Seated Lumbar Exercises ball roll forward 2x10; lateral x10 each press with breathing 2x10    Other Seated Lumbar Exercises seated clamshell with band 3x10 red      Manual Therapy   Soft tissue mobilization trigger point rleease to lumbar spine and gluteals; roller to anrior and lateral hip    Manual Traction LAD Grade II-III  PT Education - 10/15/20 1044    Education Details reviewed different slow rythmic stretching exercises    Person(s) Educated Patient    Methods Explanation;Demonstration;Tactile cues;Verbal cues;Handout    Comprehension Returned demonstration;Verbal cues required;Tactile cues required;Verbalized understanding            PT Short Term Goals - 03/28/20 1446      PT SHORT TERM GOAL #1   Title Patient will report no pain radiating into anterior thigh    Baseline no    Time 4    Period Weeks    Status Achieved    Target Date 12/09/19      PT SHORT TERM GOAL #2   Title Patient will increase bilateral LE strength to 4/5    Baseline 4+/5 gross    Time 4    Period Weeks    Status Achieved      PT SHORT TERM GOAL #3   Title Patient will increase passive right hip flexion to 105 degrrees    Baseline 100 today with pain onthe right side    Time 4    Period Weeks    Status On-going      PT SHORT TERM GOAL #4   Title Patient will increase right  cervical rotation by 10 degrees    Baseline to 58 degres today    Time 4    Period Weeks    Status On-going    Target Date 01/09/20      PT SHORT TERM GOAL #5   Title Patient will increase ross bilateral UE strength to 4+/5    Baseline improved to 4/5    Time 4    Period Weeks    Status On-going             PT Long Term Goals - 04/30/20 1430      PT LONG TERM GOAL #1   Title Patient will stand for 30 min without self reported increase pain in order to stand at a kitchen sink.    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     Baseline pain at edn range hip flexion and ir but intensity decreased    Time 6    Period Weeks    Status On-going      PT LONG TERM GOAL #4   Title Patient will reach behind his head without pain in order to perfrom ADL's    Baseline mild pain and tightness but improved    Time 6    Period Weeks    Status On-going      PT LONG TERM GOAL #5   Title Patient will report no radicular pain into her hands    Baseline more intermittent    Time 6    Period Weeks    Status On-going      PT LONG TERM GOAL #6   Title POatient will improve ABC score to 50% function in order  to decrease her risk of fall    Baseline 40%    Period Weeks    Status On-going                 Plan - 10/15/20 1049    Clinical Impression Statement Patient was stiff today but was able to perfrom rytmic stretching exercises with no pain. She also perfromed thera-band UE exercises. she reported it improved her stiffness. She had been having very little pain in her hip at this time. We  will continue to progress exercises as tolerated. She is feeling like she can mange her pain at this time. We anticipate D/C to HEP within the next fewvisits.    Personal Factors and Comorbidities Comorbidity 1;Comorbidity 2    Comorbidities MS, OA    Examination-Activity Limitations Locomotion Level;Carry;Stand;Lift;Stairs;Squat    Examination-Participation Restrictions Meal Prep;Cleaning;Community Activity;Laundry;Shop    Stability/Clinical Decision Making Stable/Uncomplicated    Clinical Decision Making Low    Rehab Potential Good    PT Frequency 1x / week    PT Duration 8 weeks    PT Treatment/Interventions ADLs/Self Care Home Management;Electrical Stimulation;Cryotherapy;Iontophoresis 4mg /ml Dexamethasone;Moist Heat;Traction;DME Instruction;Neuromuscular re-education;Patient/family education;Manual techniques;Passive range of motion;Taping    PT Next Visit Plan continue to work on [place]; hamstring stretch, tennis ball trigger point release ; abdominal breathing;Access Code: [email or web address].Seated Upper Trapezius Stretch - 1 x daily - 7 x weekly - 1 sets - 3 reps - 20 hold.Gentle Levator Scapulae Stretch - 1 x daily - 7 x weekly - 1 sets - 3 reps - 20 hold.Shoulder extension with resistance - Neutral - 1 x daily - 7 x weekly - 3 sets - 10 reps.Scapular Retraction with Resistance - 1 x daily - 7 x weekly - 3 sets - 10 reps    Consulted and Agree with Plan of Care Patient           Patient will benefit from skilled therapeutic intervention in order to improve the following deficits and impairments:  Abnormal gait,Difficulty walking,Decreased range of motion,Decreased mobility,Decreased strength,Postural dysfunction,Pain,Increased muscle spasms  Visit Diagnosis: Chronic bilateral low back pain without sciatica  Muscle weakness (generalized)  Difficulty in walking, not elsewhere classified  Cervicalgia  Cramp and spasm     Problem List Patient  Active Problem List   Diagnosis Date Noted  . Chronic bilateral low back pain without sciatica 10/02/2020  . Chronic pain syndrome 06/20/2020  . Post laminectomy syndrome 06/20/2020  . Sjogren's disease ([place]) 05/23/2020  . Primary osteoarthritis of both hands 05/23/2020  . Primary osteoarthritis of both feet 05/23/2020  . Sacroiliitis, not elsewhere classified ([place]) 12/22/2019  . Other secondary scoliosis, lumbar region 12/22/2019  . Spondylosis without myelopathy or radiculopathy, lumbar region 12/22/2019  . Cervical radiculopathy 10/18/2019  . Numbness 09/21/2019  . Bilateral carpal tunnel syndrome 09/21/2019  . High risk medication use 09/21/2019  . Memory loss 09/21/2019  . Essential hypertension 08/25/2018  . Abnormal SPEP 02/19/2018  . Hand pain 02/20/2017  . Multiple joint pain 02/18/2017  . Right elbow pain 12/09/2016  . Disturbed cognition 06/25/2016  . Trochanteric bursitis of left hip 02/18/2016  . Sciatica, right side 10/30/2015  . Bilateral arm pain 10/10/2015  . Trochanteric bursitis of both hips 08/04/2014  . Chronic fatigue 08/04/2014  . Urinary frequency 08/04/2014  . Abnormality of gait 08/04/2014  . Unspecified visual disturbance 01/31/2013  . Transient vision disturbance 01/31/2013  . Routine general medical examination at a health care facility  11/04/2011  . Colon polyps 11/03/2011  . Hot flashes, menopausal 10/13/2011  . POSTHERPETIC NEURALGIA 10/03/2009  . DISPLCMT LUMBAR INTERVERT [place] W/O MYELOPATHY 09/05/2009  . RENAL CALCULUS, RECURRENT 09/04/2009  . Hyperlipidemia 08/31/2009  . MULTIPLE SCLEROSIS, PROGRESSIVE/RELAPSING 08/31/2009  . ALLERGIC RHINITIS 08/31/2009    Carney Living PT DPT  10/15/2020, 1:34 PM  [facility] [address] [place], [place], [address] Phone: [phone]   Fax:  [phone]  Name: Dawn [name] MRN: [id number] Date of Birth: 22-Apr-1946

## 2020-10-17 DIAGNOSIS — M6281 Muscle weakness (generalized): Secondary | ICD-10-CM | POA: Diagnosis not present

## 2020-10-17 DIAGNOSIS — G609 Hereditary and idiopathic neuropathy, unspecified: Secondary | ICD-10-CM | POA: Diagnosis not present

## 2020-10-17 DIAGNOSIS — M21621 Bunionette of right foot: Secondary | ICD-10-CM | POA: Diagnosis not present

## 2020-10-17 DIAGNOSIS — M205X1 Other deformities of toe(s) (acquired), right foot: Secondary | ICD-10-CM | POA: Diagnosis not present

## 2020-10-22 ENCOUNTER — Other Ambulatory Visit: Payer: Self-pay

## 2020-10-22 ENCOUNTER — Encounter: Payer: Self-pay | Admitting: Physical Therapy

## 2020-10-22 ENCOUNTER — Ambulatory Visit: Payer: Medicare Other | Attending: Neurology | Admitting: Physical Therapy

## 2020-10-22 DIAGNOSIS — M6281 Muscle weakness (generalized): Secondary | ICD-10-CM | POA: Insufficient documentation

## 2020-10-22 DIAGNOSIS — R252 Cramp and spasm: Secondary | ICD-10-CM

## 2020-10-22 DIAGNOSIS — M545 Low back pain, unspecified: Secondary | ICD-10-CM | POA: Insufficient documentation

## 2020-10-22 DIAGNOSIS — M542 Cervicalgia: Secondary | ICD-10-CM | POA: Insufficient documentation

## 2020-10-22 DIAGNOSIS — R262 Difficulty in walking, not elsewhere classified: Secondary | ICD-10-CM | POA: Diagnosis not present

## 2020-10-22 DIAGNOSIS — G8929 Other chronic pain: Secondary | ICD-10-CM | POA: Diagnosis not present

## 2020-10-22 NOTE — Therapy (Signed)
[place], [place], [address] Phone: [phone]   Fax:  [phone]  Physical Therapy Treatment  Patient Details  Name: Dawn [name] MRN: [id number] Date of Birth: November 06, 1945 Referring Provider (PT): Dr Christell Constant   Encounter Date: 10/22/2020   PT End of Session - 10/22/20 1450    Visit Number 77    Number of Visits 51    Date for PT Re-Evaluation 11/02/20    Authorization Type progress note done on 62 th visit Next at 13    PT Start Time 1016    PT Stop Time 1058    PT Time Calculation (min) 42 min    Activity Tolerance Patient tolerated treatment well    Behavior During Therapy [facility] for tasks assessed/performed           Past Medical History:  Diagnosis Date  . Allergy   . Dry eyes   . Endometrial polyp   . Essential hypertension 08/25/2018  . GERD (gastroesophageal reflux disease)   . History of kidney stones   . Hot flashes, menopausal 10/13/2011   Estradiol started   . Jaundice as teenager   no problems since  . Memory loss 09/21/2019     1/2 of feet numb all the time  . Multiple sclerosis ([place]) dx 2001  . Neuropathy   . Osteoarthritis   . Sjogren's syndrome ([place]) dx oct 2021   sore muscvles, dry mouth and eyes  . Vision abnormalities     Past Surgical History:  Procedure Laterality Date  . ABDOMINAL HYSTERECTOMY  2002   partial  . colonscopy  2011  . DILATATION & CURETTAGE/HYSTEROSCOPY WITH MYOSURE N/A 06/08/2020   Procedure: DILATATION & CURETTAGE/HYSTEROSCOPY/Polypectomy WITH MYOSURE;  Surgeon: Armandina Stammer, DO;  Location: [place];  Service: Gynecology;  Laterality: N/A;  . LUMBAR FUSION  2001  . UPPER GI ENDOSCOPY  yrs ago    There were no vitals filed for this visit.   Subjective Assessment - 10/22/20 1435    Subjective Patient reports she sat to do a drawing for several hoiurs the other day and now her right leg is stiff. She has been working  on her stretches and exercises.    Pertinent History MS    How long can you stand comfortably? hurts as soon as she stands. Has to shift frequently    Diagnostic tests Nothing recent    Currently in Pain? Yes    Pain Score 3     Pain Location Hip    Pain Orientation Right    Pain Descriptors / Indicators Aching    Pain Type Chronic pain    Pain Radiating Towards down the lateral leg    Pain Onset More than a month ago    Pain Frequency Constant    Aggravating Factors  standing    Pain Relieving Factors stretching    Effect of Pain on Daily Activities walking and standing    Multiple Pain Sites No                             [facility] Adult PT Treatment/Exercise - 10/22/20 0001      Neck Exercises: Standing   Other Standing Exercises standing march 2x10; standing heel raise 2x10       Neck Exercises: Supine   Other Supine Exercise ball press with legs 2x10; double knee to chest with ball x20  Other Supine Exercise supine march 3x10;      Lumbar Exercises: Stretches   Lower Trunk Rotation Limitations x20    Piriformis Stretch 2 reps;20 seconds      Lumbar Exercises: Standing   Heel Raises 20 reps      Lumbar Exercises: Seated   LAQ on Chair Weights (lbs) x20 red band    Other Seated Lumbar Exercises seated clamshell with band 3x10 red      Manual Therapy   Soft tissue mobilization rollower to lateral hip and anterior hip; trigger point release to the upper gluteal    Manual Traction LAD Grade II-III                  PT Education - 10/22/20 1436    Education Details reviewed self soft tissue mobilization    Person(s) Educated Patient    Methods Explanation;Tactile cues;Demonstration;Verbal cues    Comprehension Verbalized understanding;Returned demonstration;Verbal cues required;Tactile cues required;Need further instruction            PT Short Term Goals - 03/28/20 1446      PT SHORT TERM GOAL #1   Title Patient will report no pain  radiating into anterior thigh    Baseline no    Time 4    Period Weeks    Status Achieved    Target Date 12/09/19      PT SHORT TERM GOAL #2   Title Patient will increase bilateral LE strength to 4/5    Baseline 4+/5 gross    Time 4    Period Weeks    Status Achieved      PT SHORT TERM GOAL #3   Title Patient will increase passive right hip flexion to 105 degrrees    Baseline 100 today with pain onthe right side    Time 4    Period Weeks    Status On-going      PT SHORT TERM GOAL #4   Title Patient will increase right cervical rotation by 10 degrees    Baseline to 58 degres today    Time 4    Period Weeks    Status On-going    Target Date 01/09/20      PT SHORT TERM GOAL #5   Title Patient will increase ross bilateral UE strength to 4+/5    Baseline improved to 4/5    Time 4    Period Weeks    Status On-going             PT Long Term Goals - 04/30/20 1430      PT LONG TERM GOAL #1   Title Patient will stand for 30 min without self reported increase pain in order to stand at a kitchen sink.    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     Baseline pain at edn range hip flexion and ir but intensity decreased    Time 6  Period Weeks    Status On-going      PT LONG TERM GOAL #4   Title Patient will reach behind his head without pain in order to perfrom ADL's    Baseline mild pain and tightness but improved    Time 6    Period Weeks    Status On-going      PT LONG TERM GOAL #5   Title Patient will report no radicular pain into her hands    Baseline more intermittent     Time 6    Period Weeks    Status On-going      PT LONG TERM GOAL #6   Title POatient will improve ABC score to 50% function in order to decrease her risk of fall    Baseline 40%    Period Weeks    Status On-going                 Plan - 10/22/20 1445    Clinical Impression Statement Patient came in with stiffness in her right hip. After therapy she reported a significant improvement in hip stiffness. She was able to complet ther-ex without an increase in pain. She was reencouraged to continue stretching and strengthening over the next few visits.    Personal Factors and Comorbidities Comorbidity 1;Comorbidity 2    Comorbidities MS, OA    Examination-Activity Limitations Locomotion Level;Carry;Stand;Lift;Stairs;Squat    Examination-Participation Restrictions Meal Prep;Cleaning;Community Activity;Laundry;Shop    Stability/Clinical Decision Making Stable/Uncomplicated    Clinical Decision Making Low    Rehab Potential Good    PT Frequency 1x / week    PT Duration 8 weeks    PT Treatment/Interventions ADLs/Self Care Home Management;Electrical Stimulation;Cryotherapy;Iontophoresis 4mg /ml Dexamethasone;Moist Heat;Traction;DME Instruction;Neuromuscular re-education;Patient/family education;Manual techniques;Passive range of motion;Taping    PT Next Visit Plan continue to work on [place]; hamstring stretch, tennis ball trigger point release ; abdominal breathing;Access Code: [email or web address].Seated Upper Trapezius Stretch - 1 x daily - 7 x weekly - 1 sets - 3 reps - 20 hold.Gentle Levator Scapulae Stretch - 1 x daily - 7 x weekly - 1 sets - 3 reps - 20 hold.Shoulder extension with resistance - Neutral - 1 x daily - 7 x weekly - 3 sets - 10 reps.Scapular Retraction with Resistance - 1 x daily - 7 x weekly - 3 sets - 10 reps    Consulted and Agree with Plan of Care Patient           Patient will benefit from skilled therapeutic intervention in order  to improve the following deficits and impairments:  Abnormal gait,Difficulty walking,Decreased range of motion,Decreased mobility,Decreased strength,Postural dysfunction,Pain,Increased muscle spasms  Visit Diagnosis: Chronic bilateral low back pain without sciatica  Muscle weakness (generalized)  Difficulty in walking, not elsewhere classified  Cervicalgia  Cramp and spasm     Problem List Patient Active Problem List   Diagnosis Date Noted  . Chronic bilateral low back pain without sciatica 10/02/2020  . Chronic pain syndrome 06/20/2020  . Post laminectomy syndrome 06/20/2020  . Sjogren's disease ([place]) 05/23/2020  . Primary osteoarthritis of both hands 05/23/2020  . Primary osteoarthritis of both feet 05/23/2020  . Sacroiliitis, not elsewhere classified ([place]) 12/22/2019  . Other secondary scoliosis, lumbar region 12/22/2019  . Spondylosis without myelopathy or radiculopathy, lumbar region 12/22/2019  . Cervical radiculopathy 10/18/2019  . Numbness 09/21/2019  . Bilateral carpal tunnel syndrome 09/21/2019  . High risk medication use 09/21/2019  . Memory loss 09/21/2019  .  Essential hypertension 08/25/2018  . Abnormal SPEP 02/19/2018  . Hand pain 02/20/2017  . Multiple joint pain 02/18/2017  . Right elbow pain 12/09/2016  . Disturbed cognition 06/25/2016  . Trochanteric bursitis of left hip 02/18/2016  . Sciatica, right side 10/30/2015  . Bilateral arm pain 10/10/2015  . Trochanteric bursitis of both hips 08/04/2014  . Chronic fatigue 08/04/2014  . Urinary frequency 08/04/2014  . Abnormality of gait 08/04/2014  . Unspecified visual disturbance 01/31/2013  . Transient vision disturbance 01/31/2013  . Routine general medical examination at a health care facility 11/04/2011  . Colon polyps 11/03/2011  . Hot flashes, menopausal 10/13/2011  . POSTHERPETIC NEURALGIA 10/03/2009  . DISPLCMT LUMBAR INTERVERT [place] W/O MYELOPATHY 09/05/2009  . RENAL CALCULUS, RECURRENT  09/04/2009  . Hyperlipidemia 08/31/2009  . MULTIPLE SCLEROSIS, PROGRESSIVE/RELAPSING 08/31/2009  . ALLERGIC RHINITIS 08/31/2009    Carney Living PT DPT  10/22/2020, 2:51 PM  [facility] [address] [place], [place], [address] Phone: [phone]   Fax:  [phone]  Name: Dawn [name] MRN: [id number] Date of Birth: 03-11-1946

## 2020-10-23 DIAGNOSIS — M25551 Pain in right hip: Secondary | ICD-10-CM | POA: Diagnosis not present

## 2020-10-31 ENCOUNTER — Ambulatory Visit: Payer: Medicare Other | Admitting: Physical Therapy

## 2020-10-31 ENCOUNTER — Other Ambulatory Visit: Payer: Self-pay

## 2020-10-31 ENCOUNTER — Encounter: Payer: Self-pay | Admitting: Physical Therapy

## 2020-10-31 DIAGNOSIS — M542 Cervicalgia: Secondary | ICD-10-CM | POA: Diagnosis not present

## 2020-10-31 DIAGNOSIS — R252 Cramp and spasm: Secondary | ICD-10-CM | POA: Diagnosis not present

## 2020-10-31 DIAGNOSIS — M6281 Muscle weakness (generalized): Secondary | ICD-10-CM

## 2020-10-31 DIAGNOSIS — G8929 Other chronic pain: Secondary | ICD-10-CM

## 2020-10-31 DIAGNOSIS — M25551 Pain in right hip: Secondary | ICD-10-CM | POA: Diagnosis not present

## 2020-10-31 DIAGNOSIS — M545 Low back pain, unspecified: Secondary | ICD-10-CM | POA: Diagnosis not present

## 2020-10-31 DIAGNOSIS — R262 Difficulty in walking, not elsewhere classified: Secondary | ICD-10-CM

## 2020-11-01 ENCOUNTER — Encounter: Payer: Self-pay | Admitting: Physical Therapy

## 2020-11-01 NOTE — Therapy (Signed)
[place], [place], [address] Phone: [phone]   Fax:  [phone]  Physical Therapy Treatment  Patient Details  Name: Dawn [name] MRN: [id number] Date of Birth: Aug 04, 1945 Referring Provider (PT): Dr Christell Constant   Encounter Date: 10/31/2020   PT End of Session - 11/01/20 1201    Visit Number 107    Number of Visits 60    Authorization Type progress note done on 62 th visit Next at 67    PT Start Time 1015    PT Stop Time 1058    PT Time Calculation (min) 43 min    Activity Tolerance Patient tolerated treatment well    Behavior During Therapy [facility] for tasks assessed/performed            Past Medical History:  Diagnosis Date  . Allergy   . Dry eyes   . Endometrial polyp   . Essential hypertension 08/25/2018  . GERD (gastroesophageal reflux disease)   . History of kidney stones   . Hot flashes, menopausal 10/13/2011   Estradiol started   . Jaundice as teenager   no problems since  . Memory loss 09/21/2019     1/2 of feet numb all the time  . Multiple sclerosis ([place]) dx 2001  . Neuropathy   . Osteoarthritis   . Sjogren's syndrome ([place]) dx oct 2021   sore muscvles, dry mouth and eyes  . Vision abnormalities     Past Surgical History:  Procedure Laterality Date  . ABDOMINAL HYSTERECTOMY  2002   partial  . colonscopy  2011  . DILATATION & CURETTAGE/HYSTEROSCOPY WITH MYOSURE N/A 06/08/2020   Procedure: DILATATION & CURETTAGE/HYSTEROSCOPY/Polypectomy WITH MYOSURE;  Surgeon: Armandina Stammer, DO;  Location: [place];  Service: Gynecology;  Laterality: N/A;  . LUMBAR FUSION  2001  . UPPER GI ENDOSCOPY  yrs ago    There were no vitals filed for this visit.   Subjective Assessment - 10/31/20 1022    Subjective The pain is strting to come back into the hip.    Pertinent History MS    How long can you stand comfortably? hurts as soon as she stands. Has to shift  frequently    How long can you walk comfortably? limited community ambualtion    Diagnostic tests Nothing recent    Currently in Pain? Yes    Pain Score 6     Pain Location Hip    Pain Orientation Right    Pain Descriptors / Indicators Aching    Pain Type Chronic pain    Pain Radiating Towards anterior hip    Pain Onset More than a month ago    Pain Frequency Constant    Aggravating Factors  stanidng    Pain Relieving Factors stretching    Effect of Pain on Daily Activities walking and standing    Multiple Pain Sites No                             [facility] Adult PT Treatment/Exercise - 11/01/20 0001      Self-Care   Other Self-Care Comments  reviewed how to rpogress exercises at home. Reviewed symptom management and how to progress.      Neck Exercises: Standing   Other Standing Exercises standing march 2x10; standing heel raise 2x10       Neck Exercises: Supine   Other Supine Exercise ball press with legs 2x10; double  knee to chest with ball x20    Other Supine Exercise supine march 3x10;      Lumbar Exercises: Stretches   Active Hamstring Stretch 3 reps;20 seconds    Lower Trunk Rotation Limitations x20    Piriformis Stretch 2 reps;20 seconds      Lumbar Exercises: Standing   Heel Raises 20 reps    Other Standing Lumbar Exercises bilateral march x20;      Lumbar Exercises: Seated   LAQ on Chair Weights (lbs) x20 red band    Other Seated Lumbar Exercises seated clamshell with band 3x10 red      Manual Therapy   Soft tissue mobilization rollower to lateral hip and anterior hip; trigger point release to the upper gluteal    Manual Traction LAD Grade II-III            Trigger Point Dry Needling - 11/01/20 0001    Consent Given? Yes    Other Dry Needling 3 spots in the gluteal using a .30x75 and 2 spots in the lumbar multifidi    Gluteus Medius Response Twitch response elicited    Lumbar multifidi Response Twitch response elicited;Palpable increased  muscle length                PT Education - 11/01/20 1049    Education Details reviewed final HEP    Person(s) Educated Patient    Methods Explanation;Demonstration;Tactile cues;Verbal cues    Comprehension Verbalized understanding;Returned demonstration;Verbal cues required;Tactile cues required            PT Short Term Goals - 11/01/20 1057      PT SHORT TERM GOAL #1   Title Patient will report no pain radiating into anterior thigh    Baseline continues to have pain radiating into the anterior thigh    Time 4    Period Weeks    Status Not Met    Target Date 12/09/19      PT SHORT TERM GOAL #2   Title Patient will increase bilateral LE strength to 4/5    Baseline 4+/5 gross    Time 4    Period Weeks    Status Achieved    Target Date 05/24/19      PT SHORT TERM GOAL #3   Title Patient will increase passive right hip flexion to 105 degrrees    Baseline 107 with mild pain    Time 4    Period Weeks    Status Achieved    Target Date 02/13/20      PT SHORT TERM GOAL #4   Title Patient will increase right cervical rotation by 10 degrees    Baseline to 60 degres today    Time 4    Period Weeks    Status Deferred      PT SHORT TERM GOAL #5   Title Patient will increase ross bilateral UE strength to 4+/5    Baseline 4+/5    Time 4    Period Weeks    Status Achieved    Target Date 02/13/20             PT Long Term Goals - 11/01/20 1058      PT LONG TERM GOAL #1   Title Patient will stand for 30 min without self reported increase pain in order to stand at a kitchen sink.    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 She will return to the MD for follow up, to figure out what needs to be done. She will be discharged at this time to her HEP to continue to work on it at home. She has had periods where her pain improves but then it has retruned. She tolerated dry needling today well. She reported a significant improvement in pain with needling and soft tissue mobilization. Theraqpy reviewed final goals with the patient. See below for goal specific progress.    Personal Factors and Comorbidities Comorbidity 1;Comorbidity 2    Comorbidities MS, OA    Examination-Activity Limitations Locomotion Level;Carry;Stand;Lift;Stairs;Squat    Examination-Participation Restrictions Meal Prep;Cleaning;Community Activity;Laundry;Shop    Stability/Clinical Decision Making Stable/Uncomplicated    Clinical Decision Making Low    Rehab Potential Good    PT Frequency 1x / week    PT Duration 8 weeks    PT Treatment/Interventions ADLs/Self Care Home Management;Electrical Stimulation;Cryotherapy;Iontophoresis 77m/ml Dexamethasone;Moist Heat;Traction;DME Instruction;Neuromuscular re-education;Patient/family education;Manual techniques;Passive range of motion;Taping    PT Next Visit Plan continue to work on [place] hamstring stretch, tennis ball trigger point release ; abdominal breathing;Access Code: [email or web address].Seated Upper Trapezius Stretch - 1 x daily - 7 x weekly - 1 sets - 3 reps - 20 hold.Gentle  Levator Scapulae Stretch - 1 x daily - 7 x weekly - 1 sets - 3 reps - 20 hold.Shoulder extension with resistance - Neutral - 1 x daily - 7 x weekly - 3 sets - 10 reps.Scapular Retraction with Resistance - 1 x daily - 7 x weekly - 3 sets - 10 reps    Consulted and Agree with Plan of Care Patient           Patient will benefit from skilled therapeutic intervention in order to improve the following deficits and impairments:  Abnormal gait,Difficulty walking,Decreased range of motion,Decreased mobility,Decreased strength,Postural dysfunction,Pain,Increased muscle spasms  Visit Diagnosis: Chronic bilateral low back pain without sciatica  Muscle weakness (generalized)  Difficulty in walking, not elsewhere classified  Cervicalgia  Cramp and spasm  PHYSICAL THERAPY DISCHARGE SUMMARY  Visits from Start of Care: 65  Current functional level related to goals / functional outcomes: Has full exercise program   Remaining deficits: Continues to have pain in her anterior hip    Education / Equipment: HEP  Plan: Patient agrees to discharge.  Patient goals  were partially met. Patient is being discharged due to being pleased with the current functional level.  ?????       Problem List Patient Active Problem List   Diagnosis Date Noted  . Chronic bilateral low back pain without sciatica 10/02/2020  . Chronic pain syndrome 06/20/2020  . Post laminectomy syndrome 06/20/2020  . Sjogren's disease ([place]) 05/23/2020  . Primary osteoarthritis of both hands 05/23/2020  . Primary osteoarthritis of both feet 05/23/2020  . Sacroiliitis, not elsewhere classified ([place]) 12/22/2019  . Other secondary scoliosis, lumbar region 12/22/2019  . Spondylosis without myelopathy or radiculopathy, lumbar region 12/22/2019  . Cervical radiculopathy 10/18/2019  . Numbness 09/21/2019  . Bilateral carpal tunnel syndrome 09/21/2019  . High risk medication use 09/21/2019  . Memory loss 09/21/2019  . Essential  hypertension 08/25/2018  . Abnormal SPEP 02/19/2018  . Hand pain 02/20/2017  . Multiple joint pain 02/18/2017  . Right elbow pain 12/09/2016  . Disturbed cognition 06/25/2016  . Trochanteric bursitis of left hip 02/18/2016  . Sciatica, right side 10/30/2015  . Bilateral arm pain 10/10/2015  . Trochanteric bursitis of both hips 08/04/2014  . Chronic fatigue 08/04/2014  . Urinary frequency 08/04/2014  . Abnormality of gait 08/04/2014  . Unspecified visual disturbance 01/31/2013  . Transient vision disturbance 01/31/2013  . Routine general medical examination at a health care facility 11/04/2011  . Colon polyps 11/03/2011  . Hot flashes, menopausal 10/13/2011  . POSTHERPETIC NEURALGIA 10/03/2009  . DISPLCMT LUMBAR INTERVERT [place] W/O MYELOPATHY 09/05/2009  . RENAL CALCULUS, RECURRENT 09/04/2009  . Hyperlipidemia 08/31/2009  . MULTIPLE SCLEROSIS, PROGRESSIVE/RELAPSING 08/31/2009  . ALLERGIC RHINITIS 08/31/2009    Carney Living 11/01/2020, 12:02 PM  [place], [place], [address] Phone: [phone]   Fax:  [phone]  Name: KIELA SHISLER MRN: [id number] Date of Birth: 29-Aug-1945

## 2020-11-07 DIAGNOSIS — M1611 Unilateral primary osteoarthritis, right hip: Secondary | ICD-10-CM | POA: Diagnosis not present

## 2020-11-13 ENCOUNTER — Telehealth: Payer: Self-pay | Admitting: *Deleted

## 2020-11-13 NOTE — Telephone Encounter (Signed)
Faxed completed/signed surgical clearance form back to [facility] at [phone], attn: Sherri. Received fax confirmation. Pt having R total hip replacement. Per Dr. Felecia Shelling, ok to stop ASA 5 days prior to surgery. Cleared neurologically.

## 2020-11-15 ENCOUNTER — Other Ambulatory Visit: Payer: Self-pay | Admitting: Internal Medicine

## 2020-11-15 DIAGNOSIS — I10 Essential (primary) hypertension: Secondary | ICD-10-CM

## 2020-11-16 DIAGNOSIS — Z23 Encounter for immunization: Secondary | ICD-10-CM | POA: Diagnosis not present

## 2020-11-19 ENCOUNTER — Other Ambulatory Visit: Payer: Self-pay

## 2020-11-19 ENCOUNTER — Other Ambulatory Visit: Payer: Medicare Other

## 2020-11-19 DIAGNOSIS — R5382 Chronic fatigue, unspecified: Secondary | ICD-10-CM

## 2020-11-19 DIAGNOSIS — E7800 Pure hypercholesterolemia, unspecified: Secondary | ICD-10-CM | POA: Diagnosis not present

## 2020-11-19 DIAGNOSIS — Z1159 Encounter for screening for other viral diseases: Secondary | ICD-10-CM | POA: Diagnosis not present

## 2020-11-19 DIAGNOSIS — I10 Essential (primary) hypertension: Secondary | ICD-10-CM

## 2020-11-20 ENCOUNTER — Ambulatory Visit: Payer: Medicare Other | Admitting: Rheumatology

## 2020-11-20 LAB — COMPLETE METABOLIC PANEL WITH GFR
AG Ratio: 2.1 (calc) (ref 1.0–2.5)
ALT: 25 U/L (ref 6–29)
AST: 24 U/L (ref 10–35)
Albumin: 4.1 g/dL (ref 3.6–5.1)
Alkaline phosphatase (APISO): 134 U/L (ref 37–153)
BUN: 17 mg/dL (ref 7–25)
CO2: 26 mmol/L (ref 20–32)
Calcium: 9.8 mg/dL (ref 8.6–10.4)
Chloride: 104 mmol/L (ref 98–110)
Creat: 0.67 mg/dL (ref 0.60–0.93)
GFR, Est African American: 102 mL/min/{1.73_m2} (ref >=60)
GFR, Est Non African American: 88 mL/min/{1.73_m2} (ref >=60)
Globulin: 2 g/dL (calc) (ref 1.9–3.7)
Glucose, Bld: 85 mg/dL (ref 65–99)
Potassium: 4.4 mmol/L (ref 3.5–5.3)
Sodium: 141 mmol/L (ref 135–146)
Total Bilirubin: 0.4 mg/dL (ref 0.2–1.2)
Total Protein: 6.1 g/dL (ref 6.1–8.1)

## 2020-11-20 LAB — CBC WITH DIFFERENTIAL/PLATELET
Absolute Monocytes: 395 cells/uL (ref 200–950)
Basophils Absolute: 49 cells/uL (ref 0–200)
Basophils Relative: 1.3 %
Eosinophils Absolute: 152 cells/uL (ref 15–500)
Eosinophils Relative: 4 %
HCT: 42.8 % (ref 35.0–45.0)
Hemoglobin: 14 g/dL (ref 11.7–15.5)
Lymphs Abs: 426 cells/uL — ABNORMAL LOW (ref 850–3900)
MCH: 29.4 pg (ref 27.0–33.0)
MCHC: 32.7 g/dL (ref 32.0–36.0)
MCV: 89.7 fL (ref 80.0–100.0)
MPV: 11.4 fL (ref 7.5–12.5)
Monocytes Relative: 10.4 %
Neutro Abs: 2778 cells/uL (ref 1500–7800)
Neutrophils Relative %: 73.1 %
Platelets: 183 10*3/uL (ref 140–400)
RBC: 4.77 10*6/uL (ref 3.80–5.10)
RDW: 13.6 % (ref 11.0–15.0)
Total Lymphocyte: 11.2 %
WBC: 3.8 10*3/uL (ref 3.8–10.8)

## 2020-11-20 LAB — LIPID PANEL
Cholesterol: 257 mg/dL — ABNORMAL HIGH (ref <200)
HDL: 44 mg/dL — ABNORMAL LOW (ref >=50)
LDL Cholesterol (Calc): 174 mg/dL (calc) — ABNORMAL HIGH
Non-HDL Cholesterol (Calc): 213 mg/dL (calc) — ABNORMAL HIGH (ref <130)
Total CHOL/HDL Ratio: 5.8 (calc) — ABNORMAL HIGH (ref <5.0)
Triglycerides: 231 mg/dL — ABNORMAL HIGH (ref <150)

## 2020-11-20 LAB — HEPATITIS C ANTIBODY
Hepatitis C Ab: NONREACTIVE
SIGNAL TO CUT-OFF: 0.01 (ref <1.00)

## 2020-11-20 LAB — TSH: TSH: 4.46 mIU/L (ref 0.40–4.50)

## 2020-11-21 ENCOUNTER — Encounter: Payer: Self-pay | Admitting: Orthopedic Surgery

## 2020-11-21 ENCOUNTER — Ambulatory Visit (INDEPENDENT_AMBULATORY_CARE_PROVIDER_SITE_OTHER): Payer: Medicare Other | Admitting: Orthopedic Surgery

## 2020-11-21 ENCOUNTER — Other Ambulatory Visit: Payer: Self-pay

## 2020-11-21 VITALS — BP 168/98 | HR 79 | Temp 96.7°F | Ht 62.0 in | Wt 183.6 lb

## 2020-11-21 DIAGNOSIS — I10 Essential (primary) hypertension: Secondary | ICD-10-CM

## 2020-11-21 DIAGNOSIS — M545 Low back pain, unspecified: Secondary | ICD-10-CM | POA: Diagnosis not present

## 2020-11-21 DIAGNOSIS — E7800 Pure hypercholesterolemia, unspecified: Secondary | ICD-10-CM

## 2020-11-21 DIAGNOSIS — Z780 Asymptomatic menopausal state: Secondary | ICD-10-CM | POA: Diagnosis not present

## 2020-11-21 DIAGNOSIS — M79605 Pain in left leg: Secondary | ICD-10-CM

## 2020-11-21 DIAGNOSIS — M25551 Pain in right hip: Secondary | ICD-10-CM

## 2020-11-21 DIAGNOSIS — Z23 Encounter for immunization: Secondary | ICD-10-CM | POA: Diagnosis not present

## 2020-11-21 DIAGNOSIS — G35 Multiple sclerosis: Secondary | ICD-10-CM | POA: Diagnosis not present

## 2020-11-21 MED ORDER — EZETIMIBE 10 MG PO TABS: 10.0000 mg | ORAL_TABLET | Freq: Every day | ORAL | 3 refills | Status: DC

## 2020-11-21 MED ORDER — LOSARTAN POTASSIUM 100 MG PO TABS: 100.0000 mg | ORAL_TABLET | Freq: Every day | ORAL | 1 refills | Status: DC

## 2020-11-21 NOTE — Patient Instructions (Signed)

## 2020-11-21 NOTE — Progress Notes (Signed)
Careteam: [name], [name], NP as PCP - General (Family Medicine) [name], MD as Consulting Physician (Orthopedic Surgery) [name], [name], MD (Neurology) [name], [name], [place] (Optometry) [name], NP as Nurse Practitioner (Obstetrics and Gynecology) [name], MD as Consulting Physician (Dermatology)  Seen by: [name], [name]  PLACE OF SERVICE:  [place] Directive information    No Known Allergies  No chief complaint on file.    HPI: Patient is a 75 y.o. female seen today for medical management of chronic conditions.   Labs reviewed with patient.   Cholesterol remains high. Does not know much of her family history. She has started to follow mediterranean diet. She was started on crestor in the past, reports of increased body aches. Interested in trying a non-statin for cholesterol.   Blood pressure elevated in office. Randomly checks her blood pressures at home. SBP averages > 150. Takes losartan daily for bp. Follows low sodium diet.   Scheduled for right anterior hip replacement in 7-8 weeks. Right hip pain varies. Uses walker at home. Continues to take meloxicam for pain. Sees PT for her MS and abnormal gait.   Back pain stable with injections.   No recent falls or injuries.   Review of Systems:  Review of Systems  Constitutional: Negative for chills, fever, malaise/fatigue and weight loss.  HENT: Negative for hearing loss and sore throat.   Eyes: Negative for blurred vision and double vision.  Respiratory: Negative for cough, shortness of breath and wheezing.   Cardiovascular: Negative for chest pain and leg swelling.  Gastrointestinal: Negative for abdominal pain, blood in stool, constipation, heartburn and nausea.  Genitourinary: Negative for dysuria and hematuria.  Musculoskeletal: Negative for falls.       Right hip pain  Neurological: Negative for dizziness, weakness and headaches.   Psychiatric/Behavioral: Negative for depression. The patient is not nervous/anxious.     Past Medical History:  Diagnosis Date  . Allergy   . Dry eyes   . Endometrial polyp   . Essential hypertension 08/25/2018  . GERD (gastroesophageal reflux disease)   . History of kidney stones   . Hot flashes, menopausal 10/13/2011   Estradiol started   . Jaundice as teenager   no problems since  . Memory loss 09/21/2019     1/2 of feet numb all the time  . Multiple sclerosis ([place]) dx 2001  . Neuropathy   . Osteoarthritis   . Sjogren's syndrome ([place]) dx oct 2021   sore muscvles, dry mouth and eyes  . Vision abnormalities    Past Surgical History:  Procedure Laterality Date  . ABDOMINAL HYSTERECTOMY  2002   partial  . colonscopy  2011  . DILATATION & CURETTAGE/HYSTEROSCOPY WITH MYOSURE N/A 06/08/2020   Procedure: DILATATION & CURETTAGE/HYSTEROSCOPY/Polypectomy WITH MYOSURE;  Surgeon: Armandina Stammer, DO;  Location: [place];  Service: Gynecology;  Laterality: N/A;  . LUMBAR FUSION  2001  . UPPER GI ENDOSCOPY  yrs ago   Social History:   reports that she quit smoking about 37 years ago. Her smoking use included cigarettes. She has a 7.50 pack-year smoking history. She has never used smokeless tobacco. She reports current alcohol use. She reports that she does not use drugs.  Family History  Problem Relation Age of Onset  . Heart failure Mother   . Heart failure Father   . Arthritis Other   . Hypertension Other     Medications: Patient's Medications  New Prescriptions   No medications on file  Previous Medications   ACETAMINOPHEN (TYLENOL) 500 MG TABLET    Take 500 mg by mouth as needed.   ASPIRIN EC 81 MG TABLET    Take 81 mg by mouth daily.   CALCIUM CARBONATE (OS-CAL) 600 MG TABS    Take 600 mg by mouth daily.   CLONAZEPAM (KLONOPIN) 0.5 MG TABLET    Take 1 tablet (0.5 mg total) by mouth at bedtime.   FEXOFENADINE (ALLEGRA) 180 MG TABLET    Take 180 mg by  mouth at bedtime.    GABAPENTIN (NEURONTIN) 300 MG CAPSULE    Take 1 capsule (300 mg total) by mouth at bedtime.   LEFLUNOMIDE (ARAVA) 20 MG TABLET    Take 1 tablet (20 mg total) by mouth daily.   LOSARTAN (COZAAR) 50 MG TABLET    TAKE 1 TABLET(50 MG) BY MOUTH DAILY   MELOXICAM (MOBIC) 7.5 MG TABLET    Take 7.5 mg by mouth daily.   MISC NATURAL PRODUCTS (SUPER-D3+) 5000 UNITS CAPS    Take by mouth daily.   MODAFINIL (PROVIGIL) 200 MG TABLET    Take 1 tablet (200 mg total) by mouth every morning.   OMEGA-3 ACID ETHYL ESTERS (LOVAZA) 1 G CAPSULE    Take by mouth daily.   OMEPRAZOLE (PRILOSEC) 40 MG CAPSULE    TAKE 1 CAPSULE(40 MG) BY MOUTH DAILY   OVER THE COUNTER MEDICATION    Act mouth rinse bid   SOLIFENACIN (VESICARE) 5 MG TABLET    Take 1 tablet (5 mg total) by mouth daily.   TRAMADOL (ULTRAM) 50 MG TABLET    Take 50 mg by mouth at bedtime as needed.  Modified Medications   No medications on file  Discontinued Medications   AMOXICILLIN-CLAVULANATE (AUGMENTIN) 875-125 MG TABLET    Take 1 tablet by mouth 2 (two) times daily.   TURMERIC 500 MG CAPS    Take by mouth daily.    Physical Exam:  Vitals:   11/21/20 0951  BP: (!) 204/110  Pulse: 79  Temp: (!) 96.7 F (35.9 C)  SpO2: 98%  Weight: 183 lb 9.6 oz (83.3 kg)  Height: 5\' 2"  (1.575 m)   Body mass index is 33.58 kg/m. Wt Readings from Last 3 Encounters:  11/21/20 183 lb 9.6 oz (83.3 kg)  10/11/20 184 lb 9.6 oz (83.7 kg)  10/02/20 184 lb (83.5 kg)    Physical Exam Vitals reviewed.  Constitutional:      General: She is not in acute distress. HENT:     Head: Normocephalic.     Right Ear: There is no impacted cerumen.     Left Ear: There is no impacted cerumen.  Eyes:     General:        Right eye: No discharge.        Left eye: No discharge.     Comments: glasses  Neck:     Vascular: No carotid bruit.  Cardiovascular:     Rate and Rhythm: Normal rate and regular rhythm.     Pulses: Normal pulses.     Heart  sounds: Normal heart sounds. No murmur heard.   Pulmonary:     Effort: Pulmonary effort is normal. No respiratory distress.     Breath sounds: Normal breath sounds. No wheezing.  Abdominal:     General: Bowel sounds are normal. There is no distension.     Palpations: Abdomen is soft.     Tenderness: There is  no abdominal tenderness.  Musculoskeletal:     Cervical back: Normal range of motion.     Right lower leg: No edema.     Left lower leg: No edema.  Lymphadenopathy:     Cervical: No cervical adenopathy.  Skin:    General: Skin is warm and dry.     Capillary Refill: Capillary refill takes less than 2 seconds.  Neurological:     General: No focal deficit present.     Mental Status: She is alert and oriented to person, place, and time.     Gait: Gait abnormal.  Psychiatric:        Mood and Affect: Mood normal.        Behavior: Behavior normal.    Labs reviewed: Basic Metabolic Panel: Recent Labs    04/04/20 1132 06/08/20 1042 11/19/20 0945  NA 139 142 141  K 4.5 3.8 4.4  CL 103 107 104  CO2 22  --  26  GLUCOSE 83 86 85  BUN 24 24* 17  CREATININE 0.77 0.60 0.67  CALCIUM 9.5  --  9.8  TSH  --   --  4.46   Liver Function Tests: Recent Labs    04/04/20 1132 10/02/20 1607 11/19/20 0945  AST 38 28 24  ALT 65* 69* 25  ALKPHOS 192* 161*  --   BILITOT 0.2 <0.2 0.4  PROT 6.7 6.1 6.1  ALBUMIN 4.5 4.2  --    No results for input(s): LIPASE, AMYLASE in the last 8760 hours. No results for input(s): AMMONIA in the last 8760 hours. CBC: Recent Labs    04/04/20 1132 06/08/20 1030 06/08/20 1030 06/08/20 1042 10/02/20 1607 11/19/20 0945  WBC 5.0 5.5  --   --  9.4 3.8  NEUTROABS 3.3  --   --   --  7.6* 2,778  HGB 13.2 13.9   < > 14.6 13.4 14.0  HCT 40.9 42.7   < > 43.0 39.8 42.8  MCV 92 91.2  --   --  88 89.7  PLT 211 213  --   --  223 183   < > = values in this interval not displayed.   Lipid Panel: Recent Labs    11/19/20 0945  CHOL 257*  HDL 44*   LDLCALC 174*  TRIG 231*  CHOLHDL 5.8*   TSH: Recent Labs    11/19/20 0945  TSH 4.46   A1C: No results found for: HGBA1C   Assessment/Plan 1. Pure hypercholesterolemia - LDL 174- 05/02 - statin intolerance in past with crestor- increased muscle aches - ezetimibe (ZETIA) 10 MG tablet; Take 1 tablet (10 mg total) by mouth daily.  Dispense: 90 tablet; Refill: 3 - cont low fat diet and avoid fried foods - Lipid Panel; Future  2. Essential hypertension - elevated, goal < 150/90 - losartan (COZAAR) 100 MG tablet; Take 1 tablet (100 mg total) by mouth daily.  Dispense: 90 tablet; Refill: 1 - cont low sodium diet < 2000 mg day - return in 1 week with blood pressure log  3. Low back pain radiating to left lower extremity - stable with injections from Dr. Ernestina Patches - cont weekly PT   4. MULTIPLE SCLEROSIS, PROGRESSIVE/RELAPSING - stable, right hip appear to be more painful at this time  5. Right hip pain - followed by Dr. Mardelle Matte - plans for right anterior hip replacement in 7-8 weeks - cont meloxicam for pain and prn tylenol and  tramadol - recommend upper arm strength training to prepare for  surgery  6. Postmenopausal estrogen deficiency - dexa scan- future  7. Need for Tdap vaccination  Total time: 36 minutes. Greater than 50% of total time spent doing patient education and coordination of care regarding hip pain, blood pressure management, and medications.    Next appt: 11/29/2020 [name], [facility] [phone]

## 2020-11-22 ENCOUNTER — Ambulatory Visit: Payer: Medicare Other | Admitting: Internal Medicine

## 2020-11-28 ENCOUNTER — Ambulatory Visit: Payer: Medicare Other | Admitting: Family

## 2020-11-29 ENCOUNTER — Ambulatory Visit (INDEPENDENT_AMBULATORY_CARE_PROVIDER_SITE_OTHER): Payer: Medicare Other | Admitting: Orthopedic Surgery

## 2020-11-29 ENCOUNTER — Other Ambulatory Visit: Payer: Self-pay

## 2020-11-29 ENCOUNTER — Encounter: Payer: Self-pay | Admitting: Orthopedic Surgery

## 2020-11-29 VITALS — BP 110/60 | HR 79 | Temp 97.1°F | Resp 16 | Ht 62.0 in | Wt 183.2 lb

## 2020-11-29 DIAGNOSIS — E7800 Pure hypercholesterolemia, unspecified: Secondary | ICD-10-CM | POA: Diagnosis not present

## 2020-11-29 DIAGNOSIS — I10 Essential (primary) hypertension: Secondary | ICD-10-CM | POA: Diagnosis not present

## 2020-11-29 DIAGNOSIS — M25551 Pain in right hip: Secondary | ICD-10-CM | POA: Diagnosis not present

## 2020-11-29 NOTE — Progress Notes (Signed)
Careteam: [name], [name], NP as PCP - General (Family Medicine) [name], MD as Consulting Physician (Orthopedic Surgery) [name], [name], MD (Neurology) [name], [name], [name] (Optometry) [name], NP as Nurse Practitioner (Obstetrics and Gynecology) [name], MD as Consulting Physician (Dermatology)  Seen by: [name], [name]  PLACE OF SERVICE:  [place] Directive information Does Patient Have a Medical Advance Directive?: Yes, Type of Advance Directive: Living will, Does patient want to make changes to medical advance directive?: No - Patient declined  No Known Allergies  Chief Complaint  Patient presents with  . Follow-up    1 week follow up on blood pressure, and cholesterol.      HPI: Patient is a 75 y.o. female seen today for follow up on elevated blood pressure.   She brought her blood pressure journal with her today. Average pressures SBP 130-140. Reports feeling better with losartan increase. Denies chest pain, dizziness, headaches or blurred vision. Continues to follow a low sodium diet.   Recent home blood pressures:   05/10- 142/71  05/11- 143/69  05/12- 140/64   05/10 she reports feeling foggy, dizzy and confused. Prescribed gabapentin by Dr. Arlice Colt in March. She believes the gabapentin was the cause. She has since stopped taking medication and symptoms have resolved.   Continues to take Zetia daily. She is being more careful about her diet. Eating low fat foods and avoiding fried foods.    Discussed anterior hip replacement with Dr. Mardelle Matte.Reports he does posterior hip replacements. She would like a second opinion for orthopedic specialist to discuss anterior hip replacement. Interested in Interior and spatial designer.    Review of Systems:  Review of Systems  Constitutional: Negative for chills, fever, malaise/fatigue and weight loss.  Respiratory: Negative for cough, sputum production and  shortness of breath.   Cardiovascular: Negative for chest pain and leg swelling.  Musculoskeletal: Positive for joint pain. Negative for falls.       Right hip pain  Neurological: Negative for dizziness, weakness and headaches.  Psychiatric/Behavioral: Negative for depression. The patient is not nervous/anxious.     Past Medical History:  Diagnosis Date  . Allergy   . Dry eyes   . Endometrial polyp   . Essential hypertension 08/25/2018  . GERD (gastroesophageal reflux disease)   . History of kidney stones   . Hot flashes, menopausal 10/13/2011   Estradiol started   . Jaundice as teenager   no problems since  . Memory loss 09/21/2019     1/2 of feet numb all the time  . Multiple sclerosis ([place]) dx 2001  . Neuropathy   . Osteoarthritis   . Sjogren's syndrome ([place]) dx oct 2021   sore muscvles, dry mouth and eyes  . Vision abnormalities    Past Surgical History:  Procedure Laterality Date  . ABDOMINAL HYSTERECTOMY  2002   partial  . colonscopy  2011  . DILATATION & CURETTAGE/HYSTEROSCOPY WITH MYOSURE N/A 06/08/2020   Procedure: DILATATION & CURETTAGE/HYSTEROSCOPY/Polypectomy WITH MYOSURE;  Surgeon: Armandina Stammer, DO;  Location: [place];  Service: Gynecology;  Laterality: N/A;  . LUMBAR FUSION  2001  . UPPER GI ENDOSCOPY  yrs ago   Social History:   reports that she quit smoking about 37 years ago. Her smoking use included cigarettes. She has a 7.50 pack-year smoking history. She has never used smokeless tobacco. She reports current alcohol use. She reports that she does not use drugs.  Family  History  Problem Relation Age of Onset  . Heart failure Mother   . Heart failure Father   . Arthritis Other   . Hypertension Other     Medications: Patient's Medications  New Prescriptions   No medications on file  Previous Medications   ACETAMINOPHEN (TYLENOL) 500 MG TABLET    Take 500 mg by mouth as needed.   ASPIRIN EC 81 MG TABLET    Take 81 mg by mouth  daily.   CALCIUM CARBONATE (OS-CAL) 600 MG TABS    Take 600 mg by mouth daily.   CLONAZEPAM (KLONOPIN) 0.5 MG TABLET    Take 1 tablet (0.5 mg total) by mouth at bedtime.   EZETIMIBE (ZETIA) 10 MG TABLET    Take 1 tablet (10 mg total) by mouth daily.   FEXOFENADINE (ALLEGRA) 180 MG TABLET    Take 180 mg by mouth at bedtime.    GABAPENTIN (NEURONTIN) 300 MG CAPSULE    Take 1 capsule (300 mg total) by mouth at bedtime.   LEFLUNOMIDE (ARAVA) 20 MG TABLET    Take 1 tablet (20 mg total) by mouth daily.   LOSARTAN (COZAAR) 100 MG TABLET    Take 1 tablet (100 mg total) by mouth daily.   MELOXICAM (MOBIC) 7.5 MG TABLET    Take 7.5 mg by mouth daily.   MISC NATURAL PRODUCTS (SUPER-D3+) 5000 UNITS CAPS    Take by mouth daily.   MODAFINIL (PROVIGIL) 200 MG TABLET    Take 1 tablet (200 mg total) by mouth every morning.   OMEGA-3 ACID ETHYL ESTERS (LOVAZA) 1 G CAPSULE    Take by mouth daily.   OMEPRAZOLE (PRILOSEC) 40 MG CAPSULE    TAKE 1 CAPSULE(40 MG) BY MOUTH DAILY   OVER THE COUNTER MEDICATION    Act mouth rinse bid   SOLIFENACIN (VESICARE) 5 MG TABLET    Take 1 tablet (5 mg total) by mouth daily.   TRAMADOL (ULTRAM) 50 MG TABLET    Take 50 mg by mouth at bedtime as needed.  Modified Medications   No medications on file  Discontinued Medications   No medications on file    Physical Exam:  Vitals:   11/29/20 1254  BP: 110/60  Pulse: 79  Resp: 16  Temp: (!) 97.1 F (36.2 C)  SpO2: 97%  Weight: 183 lb 3.2 oz (83.1 kg)  Height: 5\' 2"  (1.575 m)   Body mass index is 33.51 kg/m. Wt Readings from Last 3 Encounters:  11/29/20 183 lb 3.2 oz (83.1 kg)  11/21/20 183 lb 9.6 oz (83.3 kg)  10/11/20 184 lb 9.6 oz (83.7 kg)    Physical Exam Vitals reviewed.  Constitutional:      General: She is not in acute distress. HENT:     Head: Normocephalic.  Neck:     Vascular: No carotid bruit.  Cardiovascular:     Rate and Rhythm: Normal rate and regular rhythm.     Pulses: Normal pulses.      Heart sounds: Normal heart sounds. No murmur heard.   Pulmonary:     Effort: Pulmonary effort is normal. No respiratory distress.     Breath sounds: Normal breath sounds. No wheezing.  Musculoskeletal:     Cervical back: Normal range of motion.     Right lower leg: No edema.     Left lower leg: No edema.  Lymphadenopathy:     Cervical: No cervical adenopathy.  Skin:    General: Skin is warm and dry.  Neurological:     General: No focal deficit present.     Mental Status: She is alert and oriented to person, place, and time.     Gait: Gait abnormal.  Psychiatric:        Mood and Affect: Mood normal.        Behavior: Behavior normal.     Labs reviewed: Basic Metabolic Panel: Recent Labs    04/04/20 1132 06/08/20 1042 11/19/20 0945  NA 139 142 141  K 4.5 3.8 4.4  CL 103 107 104  CO2 22  --  26  GLUCOSE 83 86 85  BUN 24 24* 17  CREATININE 0.77 0.60 0.67  CALCIUM 9.5  --  9.8  TSH  --   --  4.46   Liver Function Tests: Recent Labs    04/04/20 1132 10/02/20 1607 11/19/20 0945  AST 38 28 24  ALT 65* 69* 25  ALKPHOS 192* 161*  --   BILITOT 0.2 <0.2 0.4  PROT 6.7 6.1 6.1  ALBUMIN 4.5 4.2  --    No results for input(s): LIPASE, AMYLASE in the last 8760 hours. No results for input(s): AMMONIA in the last 8760 hours. CBC: Recent Labs    04/04/20 1132 06/08/20 1030 06/08/20 1030 06/08/20 1042 10/02/20 1607 11/19/20 0945  WBC 5.0 5.5  --   --  9.4 3.8  NEUTROABS 3.3  --   --   --  7.6* 2,778  HGB 13.2 13.9   < > 14.6 13.4 14.0  HCT 40.9 42.7   < > 43.0 39.8 42.8  MCV 92 91.2  --   --  88 89.7  PLT 211 213  --   --  223 183   < > = values in this interval not displayed.   Lipid Panel: Recent Labs    11/19/20 0945  CHOL 257*  HDL 44*  LDLCALC 174*  TRIG 231*  CHOLHDL 5.8*   TSH: Recent Labs    11/19/20 0945  TSH 4.46   A1C: No results found for: HGBA1C   Assessment/Plan 1. Right hip pain - followed by Dr. Mardelle Matte, interested in anterior  approach - discontinued gabapentin due to being foggy, dizzy and confused - cont prn tylenol - Ambulatory referral to Orthopedic Surgery  2. Essential hypertension - controlled  - cont losartan 100 mg daily - Basic Metabolic Panel  3. Pure hypercholesterolemia - cont Zetia - lipid panel- 3 months   Total time: 28 minutes. Greater than 50% of total time spent doing patient education and coordination of care discussing orthopedic pain and dietary modifications regarding elevated cholesterol.   Next appt: Visit date not found [place], [facility] [phone]

## 2020-11-29 NOTE — Patient Instructions (Addendum)
Referral for anterior hip replacement at [facility]- Dr. Rod Can or Campbell Lerner  Stop gabapentin  Continue Zetia for cholesterol  Continue losartan for blood pressure.   Hip Pain The hip is the joint between the upper legs and the lower pelvis. The bones, cartilage, tendons, and muscles of your hip joint support your body and allow you to move around. Hip pain can range from a minor ache to severe pain in one or both of your hips. The pain may be felt on the inside of the hip joint near the groin, or on the outside near the buttocks and upper thigh. You may also have swelling or stiffness in your hip area. Follow these instructions at home: Managing pain, stiffness, and swelling  If directed, put ice on the painful area. To do this: ? Put ice in a plastic bag. ? Place a towel between your skin and the bag. ? Leave the ice on for 20 minutes, 2-3 times a day.  If directed, apply heat to the affected area as often as told by your health care provider. Use the heat source that your health care provider recommends, such as a moist heat pack or a heating pad. ? Place a towel between your skin and the heat source. ? Leave the heat on for 20-30 minutes. ? Remove the heat if your skin turns bright red. This is especially important if you are unable to feel pain, heat, or cold. You may have a greater risk of getting burned.      Activity  Do exercises as told by your health care provider.  Avoid activities that cause pain. General instructions  Take over-the-counter and prescription medicines only as told by your health care provider.  Keep a journal of your symptoms. Write down: ? How often you have hip pain. ? The location of your pain. ? What the pain feels like. ? What makes the pain worse.  Sleep with a pillow between your legs on your most comfortable side.  Keep all follow-up visits as told by your health care provider. This is important.   Contact a health care  provider if:  You cannot put weight on your leg.  Your pain or swelling continues or gets worse after one week.  It gets harder to walk.  You have a fever. Get help right away if:  You fall.  You have a sudden increase in pain and swelling in your hip.  Your hip is red or swollen or very tender to touch. Summary  Hip pain can range from a minor ache to severe pain in one or both of your hips.  The pain may be felt on the inside of the hip joint near the groin, or on the outside near the buttocks and upper thigh.  Avoid activities that cause pain.  Write down how often you have hip pain, the location of the pain, what makes it worse, and what it feels like. This information is not intended to replace advice given to you by your health care provider. Make sure you discuss any questions you have with your health care provider. Document Revised: 11/22/2018 Document Reviewed: 11/22/2018 Elsevier Patient Education  [place].

## 2020-11-30 LAB — BASIC METABOLIC PANEL
BUN: 19 mg/dL (ref 7–25)
CO2: 24 mmol/L (ref 20–32)
Calcium: 9.6 mg/dL (ref 8.6–10.4)
Chloride: 105 mmol/L (ref 98–110)
Creat: 0.77 mg/dL (ref 0.60–0.93)
Glucose, Bld: 87 mg/dL (ref 65–139)
Potassium: 4.6 mmol/L (ref 3.5–5.3)
Sodium: 139 mmol/L (ref 135–146)

## 2020-12-05 ENCOUNTER — Encounter (HOSPITAL_BASED_OUTPATIENT_CLINIC_OR_DEPARTMENT_OTHER): Payer: Self-pay | Admitting: Physical Therapy

## 2020-12-05 ENCOUNTER — Ambulatory Visit (HOSPITAL_BASED_OUTPATIENT_CLINIC_OR_DEPARTMENT_OTHER): Payer: Medicare Other | Attending: Neurology | Admitting: Physical Therapy

## 2020-12-05 ENCOUNTER — Other Ambulatory Visit: Payer: Self-pay

## 2020-12-05 DIAGNOSIS — G8929 Other chronic pain: Secondary | ICD-10-CM | POA: Diagnosis not present

## 2020-12-05 DIAGNOSIS — M542 Cervicalgia: Secondary | ICD-10-CM | POA: Diagnosis not present

## 2020-12-05 DIAGNOSIS — M6281 Muscle weakness (generalized): Secondary | ICD-10-CM

## 2020-12-05 DIAGNOSIS — R262 Difficulty in walking, not elsewhere classified: Secondary | ICD-10-CM | POA: Insufficient documentation

## 2020-12-05 DIAGNOSIS — M545 Low back pain, unspecified: Secondary | ICD-10-CM | POA: Diagnosis not present

## 2020-12-05 DIAGNOSIS — R252 Cramp and spasm: Secondary | ICD-10-CM | POA: Diagnosis not present

## 2020-12-06 ENCOUNTER — Encounter (HOSPITAL_BASED_OUTPATIENT_CLINIC_OR_DEPARTMENT_OTHER): Payer: Self-pay | Admitting: Physical Therapy

## 2020-12-06 NOTE — Therapy (Signed)
[facility] [place], [place], [address] Phone: [phone]   Fax:  [phone]  Physical Therapy Evaluation  Patient Details  Name: Dawn [name] MRN: [id number] Date of Birth: [birth year] Referring Provider (PT): Dr Arlice Colt   Encounter Date: 12/05/2020   PT End of Session - 12/05/20 1031    Visit Number 1    Number of Visits 12    Date for PT Re-Evaluation 01/16/21    Authorization Type progress note on week 10. Kx now    PT Start Time 1015    PT Stop Time 1056    PT Time Calculation (min) 41 min    Activity Tolerance Patient tolerated treatment well    Behavior During Therapy WFL for tasks assessed/performed           Past Medical History:  Diagnosis Date  . Allergy   . Dry eyes   . Endometrial polyp   . Essential hypertension 08/25/2018  . GERD (gastroesophageal reflux disease)   . History of kidney stones   . Hot flashes, menopausal 10/13/2011   Estradiol started   . Jaundice as teenager   no problems since  . Memory loss 09/21/2019     1/2 of feet numb all the time  . Multiple sclerosis ([place]) dx 2001  . Neuropathy   . Osteoarthritis   . Sjogren's syndrome ([place]) dx oct 2021   sore muscvles, dry mouth and eyes  . Vision abnormalities     Past Surgical History:  Procedure Laterality Date  . ABDOMINAL HYSTERECTOMY  2002   partial  . colonscopy  2011  . DILATATION & CURETTAGE/HYSTEROSCOPY WITH MYOSURE N/A 06/08/2020   Procedure: DILATATION & CURETTAGE/HYSTEROSCOPY/Polypectomy WITH MYOSURE;  Surgeon: Armandina Stammer, DO;  Location: [facility];  Service: Gynecology;  Laterality: N/A;  . LUMBAR FUSION  2001  . UPPER GI ENDOSCOPY  yrs ago    There were no vitals filed for this visit.    Subjective Assessment - 12/05/20 1027    Subjective The patient continues to have significant pain in her righthip. She is taking meloxicam now which has helped. She has been exercising  as much as possible.    How long can you stand comfortably? hurts as soon as she stands. Has to shift frequently    How long can you walk comfortably? limited community ambualtion    Diagnostic tests Nothing recent    Currently in Pain? Yes    Pain Score 3     Pain Location Hip    Pain Orientation Right    Pain Descriptors / Indicators Aching    Pain Type Chronic pain    Pain Radiating Towards anterior hip    Pain Onset More than a month ago    Pain Frequency Constant    Aggravating Factors  standing and walking    Pain Relieving Factors stretching    Effect of Pain on Daily Activities walking and standing    Multiple Pain Sites No              [facility] PT Assessment - 12/06/20 0001      Assessment   Medical Diagnosis Right Hip Pain    Referring Provider (PT) Dr Arlice Colt    Onset Date/Surgical Date --   Long standing right hip pain   Hand Dominance Right    Next MD Visit 4-5 months    Prior Therapy None      Precautions   Precautions None  Restrictions   Weight Bearing Restrictions No      Balance Screen   Has the patient fallen in the past 6 months No    Has the patient had a decrease in activity level because of a fear of falling?  No    Is the patient reluctant to leave their home because of a fear of falling?  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 Status Within Functional Limits for tasks assessed    Attention Focused    Focused Attention Appears intact    Memory Appears intact    Awareness Appears intact    Problem Solving Appears intact      Sensation   Light Touch Appears Intact    Additional Comments numbness into the hip and tightness into the knee      Coordination   Gross Motor Movements are Fluid and Coordinated Yes    Fine Motor Movements are Fluid and Coordinated  Yes      PROM   Right Hip Flexion 88    Left Hip Flexion 100      Strength   Right Hip Flexion 3+/5    Right Hip ABduction 3+/5    Left Hip Flexion 4+/5    Left Hip ABduction 4+/5    Right Knee Flexion 3+/5    Right Knee Extension 3+/5    Left Knee Flexion 4+/5    Left Knee Extension 4+/5      Palpation   Palpation comment spasming in the right gluteal; right anterior hip; right IT band; decreased right hip flexion; flexed trunk with gait                      Objective measurements completed on examination: See above findings.       [facility] Adult PT Treatment/Exercise - 12/06/20 0001      Transfers   Comments 23  seconds      Ambulation/Gait   Gait Comments 5 m walk test 8 seconds 7 sec 9 sec      Manual Therapy   Manual therapy comments skilled palaption of trigger points    Soft tissue mobilization rollower to lateral hip and anterior hip; trigger point release to the upper gluteal    Manual Traction LAD Grade II-III                  PT Education - 12/05/20 1030    Education Details reviewed HEP and symptom management    Person(s) Educated Patient    Methods Explanation;Demonstration;Tactile cues;Verbal cues    Comprehension Verbalized understanding;Returned demonstration;Verbal cues required;Tactile cues required            PT Short Term Goals - 12/06/20 1417      PT SHORT TERM GOAL #1   Title Patient will increase right hip flexion to 90 degrees withiut pain    Time 4    Period Weeks    Status New    Target Date 01/17/21      PT SHORT TERM GOAL #2   Title Patient will increase bilateral LE strength to 4/5    Baseline 3/5 hip flexion and 3+/5  abduction    Time 4    Period Weeks    Status New    Target Date  01/17/21      PT SHORT TERM GOAL #3   Title Patient will decrease sit to stand time by 8 seconds    Time 4    Period Weeks    Status On-going    Target Date 02/13/20             PT Long Term Goals - 12/06/20 1421       PT LONG TERM GOAL #1   Title Patient will stand for 30 min without self reported increase pain in order to stand at a kitchen sink.    Baseline has had progressive loss of ability to stand    Time 6    Period Weeks    Status New    Target Date 01/17/21      PT LONG TERM GOAL #2   Title Patient will be independnet with complete pre-hab program to improve potential surgical outcome    Time 6    Period Weeks    Status New    Target Date 01/17/21                  Plan - 12/06/20 1413    Clinical Impression Statement Patient returns after 2 months without therapy. She feels a progress loss in ability to stand for ADL's. Her sit to stand time has decreased and her left hip strength has decreased. She has been advised she needs a toltal hip replacement. She is getting a second opinion regaurding apporach. In the meantime we will continue to work on strengthneing, hip mobility, and functional moblity training.    Personal Factors and Comorbidities Comorbidity 1;Comorbidity 2    Comorbidities MS, OA    Examination-Activity Limitations Locomotion Level;Carry;Stand;Lift;Stairs;Squat    Examination-Participation Restrictions Meal Prep;Cleaning;Community Activity;Laundry;Shop    Stability/Clinical Decision Making Stable/Uncomplicated    Clinical Decision Making Low    Rehab Potential Good    PT Frequency 1x / week    PT Duration 8 weeks    PT Treatment/Interventions ADLs/Self Care Home Management;Electrical Stimulation;Cryotherapy;Iontophoresis 4mg /ml Dexamethasone;Moist Heat;Traction;DME Instruction;Neuromuscular re-education;Patient/family education;Manual techniques;Passive range of motion;Taping    PT Next Visit Plan prehab for hip replacement; continue with strengthening and manual therapy; consider pool therapy for prehab           Patient will benefit from skilled therapeutic intervention in order to improve the following deficits and impairments:  Abnormal gait,Difficulty  walking,Decreased range of motion,Decreased mobility,Decreased strength,Postural dysfunction,Pain,Increased muscle spasms  Visit Diagnosis: Chronic bilateral low back pain without sciatica  Muscle weakness (generalized)  Difficulty in walking, not elsewhere classified  Cervicalgia  Cramp and spasm     Problem List Patient Active Problem List   Diagnosis Date Noted  . Chronic bilateral low back pain without sciatica 10/02/2020  . Chronic pain syndrome 06/20/2020  . Post laminectomy syndrome 06/20/2020  . Sjogren's disease ([place]) 05/23/2020  . Primary osteoarthritis of both hands 05/23/2020  . Primary osteoarthritis of both feet 05/23/2020  . Sacroiliitis, not elsewhere classified ([place]) 12/22/2019  . Other secondary scoliosis, lumbar region 12/22/2019  . Spondylosis without myelopathy or radiculopathy, lumbar region 12/22/2019  . Cervical radiculopathy 10/18/2019  . Numbness 09/21/2019  . Bilateral carpal tunnel syndrome 09/21/2019  . High risk medication use 09/21/2019  . Memory loss 09/21/2019  . Essential hypertension 08/25/2018  . Abnormal SPEP 02/19/2018  . Hand pain 02/20/2017  . Multiple joint pain 02/18/2017  . Right elbow pain 12/09/2016  . Disturbed cognition 06/25/2016  . Trochanteric bursitis of left hip 02/18/2016  . Sciatica,  right side 10/30/2015  . Bilateral arm pain 10/10/2015  . Trochanteric bursitis of both hips 08/04/2014  . Chronic fatigue 08/04/2014  . Urinary frequency 08/04/2014  . Abnormality of gait 08/04/2014  . Unspecified visual disturbance 01/31/2013  . Transient vision disturbance 01/31/2013  . Routine general medical examination at a health care facility 11/04/2011  . Colon polyps 11/03/2011  . Hot flashes, menopausal 10/13/2011  . POSTHERPETIC NEURALGIA 10/03/2009  . DISPLCMT LUMBAR INTERVERT [place] W/O MYELOPATHY 09/05/2009  . RENAL CALCULUS, RECURRENT 09/04/2009  . Hyperlipidemia 08/31/2009  . MULTIPLE SCLEROSIS,  PROGRESSIVE/RELAPSING 08/31/2009  . ALLERGIC RHINITIS 08/31/2009    Carney Living PT DPT  12/06/2020, 2:29 PM  [facility] [place], [place], [address] Phone: [phone]   Fax:  [phone]  Name: Dawn [name] MRN: [id number] Date of Birth: 07/28/1945

## 2020-12-12 ENCOUNTER — Other Ambulatory Visit: Payer: Self-pay

## 2020-12-12 ENCOUNTER — Encounter (HOSPITAL_BASED_OUTPATIENT_CLINIC_OR_DEPARTMENT_OTHER): Payer: Self-pay | Admitting: Physical Therapy

## 2020-12-12 ENCOUNTER — Ambulatory Visit (HOSPITAL_BASED_OUTPATIENT_CLINIC_OR_DEPARTMENT_OTHER): Payer: Medicare Other | Admitting: Physical Therapy

## 2020-12-12 DIAGNOSIS — G8929 Other chronic pain: Secondary | ICD-10-CM | POA: Diagnosis not present

## 2020-12-12 DIAGNOSIS — R252 Cramp and spasm: Secondary | ICD-10-CM

## 2020-12-12 DIAGNOSIS — R262 Difficulty in walking, not elsewhere classified: Secondary | ICD-10-CM

## 2020-12-12 DIAGNOSIS — M6281 Muscle weakness (generalized): Secondary | ICD-10-CM

## 2020-12-12 DIAGNOSIS — M542 Cervicalgia: Secondary | ICD-10-CM

## 2020-12-12 DIAGNOSIS — M545 Low back pain, unspecified: Secondary | ICD-10-CM | POA: Diagnosis not present

## 2020-12-13 ENCOUNTER — Encounter (HOSPITAL_BASED_OUTPATIENT_CLINIC_OR_DEPARTMENT_OTHER): Payer: Self-pay | Admitting: Physical Therapy

## 2020-12-13 NOTE — Therapy (Signed)
[facility] [place], [place], [address] Phone: [phone]   Fax:  [phone]  Physical Therapy Treatment  Patient Details  Name: Dawn [name] MRN: [id number] Date of Birth: 1946/04/22 Referring Provider (PT): Dr Arlice Colt   Encounter Date: 12/12/2020   PT End of Session - 12/12/20 1416    Visit Number 2    Number of Visits 12    Date for PT Re-Evaluation 01/16/21    Authorization Type progress note on week 10. Kx now    PT Start Time 1015    PT Stop Time 1057    PT Time Calculation (min) 42 min    Activity Tolerance Patient tolerated treatment well    Behavior During Therapy WFL for tasks assessed/performed           Past Medical History:  Diagnosis Date  . Allergy   . Dry eyes   . Endometrial polyp   . Essential hypertension 08/25/2018  . GERD (gastroesophageal reflux disease)   . History of kidney stones   . Hot flashes, menopausal 10/13/2011   Estradiol started   . Jaundice as teenager   no problems since  . Memory loss 09/21/2019     1/2 of feet numb all the time  . Multiple sclerosis ([place]) dx 2001  . Neuropathy   . Osteoarthritis   . Sjogren's syndrome ([place]) dx oct 2021   sore muscvles, dry mouth and eyes  . Vision abnormalities     Past Surgical History:  Procedure Laterality Date  . ABDOMINAL HYSTERECTOMY  2002   partial  . colonscopy  2011  . DILATATION & CURETTAGE/HYSTEROSCOPY WITH MYOSURE N/A 06/08/2020   Procedure: DILATATION & CURETTAGE/HYSTEROSCOPY/Polypectomy WITH MYOSURE;  Surgeon: Armandina Stammer, DO;  Location: [facility];  Service: Gynecology;  Laterality: N/A;  . LUMBAR FUSION  2001  . UPPER GI ENDOSCOPY  yrs ago    There were no vitals filed for this visit.   Subjective Assessment - 12/12/20 1018    Subjective The patient reports her right hip has been really hurting her the last few days. She has an appointment to see her MD next week     Pertinent History MS    How long can you stand comfortably? hurts as soon as she stands. Has to shift frequently    Currently in Pain? Yes    Pain Score 6     Pain Location Hip    Pain Orientation Right    Pain Descriptors / Indicators Aching    Pain Type Chronic pain    Pain Onset More than a month ago    Pain Frequency Constant    Aggravating Factors  standing and walking    Pain Relieving Factors stretching    Effect of Pain on Daily Activities walking and standing    Multiple Pain Sites No                             [facility] Adult PT Treatment/Exercise - 12/13/20 0001      Lumbar Exercises: Stretches   Active Hamstring Stretch 3 reps;20 seconds    Piriformis Stretch 2 reps;20 seconds      Lumbar Exercises: Seated   LAQ on Chair Weights (lbs) x20 red band    Other Seated Lumbar Exercises hamstring curl x20 red    Other Seated Lumbar Exercises seated clamshell with band 3x10 red      Manual  Therapy   Manual therapy comments skilled palaption of trigger points    Soft tissue mobilization rollower to lateral hip and anterior hip; trigger point release to the upper gluteal    Manual Traction LAD Grade II-III            Trigger Point Dry Needling - 12/13/20 0001    Consent Given? Yes    Other Dry Needling 3 spost in gluteal and 2 spots in piriformis    Gluteus Medius Response Twitch response elicited                PT Education - 12/13/20 1128    Education Details reviewed benefits of stretching    Person(s) Educated Patient    Methods Explanation;Demonstration;Tactile cues;Verbal cues    Comprehension Verbalized understanding;Returned demonstration;Tactile cues required;Verbal cues required            PT Short Term Goals - 12/06/20 1417      PT SHORT TERM GOAL #1   Title Patient will increase right hip flexion to 90 degrees withiut pain    Time 4    Period Weeks    Status New    Target Date 01/17/21      PT SHORT TERM GOAL #2    Title Patient will increase bilateral LE strength to 4/5    Baseline 3/5 hip flexion and 3+/5  abduction    Time 4    Period Weeks    Status New    Target Date 01/17/21      PT SHORT TERM GOAL #3   Title Patient will decrease sit to stand time by 8 seconds    Time 4    Period Weeks    Status On-going    Target Date 02/13/20             PT Long Term Goals - 12/06/20 1421      PT LONG TERM GOAL #1   Title Patient will stand for 30 min without self reported increase pain in order to stand at a kitchen sink.    Baseline has had progressive loss of ability to stand    Time 6    Period Weeks    Status New    Target Date 01/17/21      PT LONG TERM GOAL #2   Title Patient will be independnet with complete pre-hab program to improve potential surgical outcome    Time 6    Period Weeks    Status New    Target Date 01/17/21                 Plan - 12/12/20 1417    Clinical Impression Statement Patient had a significant limitation in right hip mobility today. therapy perfromed manual therapy but the motion only improved slightly. She reported decreased pain after treatment. She was able to perfrom light activity. She will have a second opinion on her hip soon. She was still able to complete ther-ex depsite limitations in movement and pain.    Personal Factors and Comorbidities Comorbidity 1;Comorbidity 2    Comorbidities MS, OA    Examination-Activity Limitations Locomotion Level;Carry;Stand;Lift;Stairs;Squat    Examination-Participation Restrictions Meal Prep;Cleaning;Community Activity;Laundry;Shop    Clinical Decision Making Low    Rehab Potential Good    PT Frequency 1x / week    PT Duration 8 weeks    PT Treatment/Interventions ADLs/Self Care Home Management;Electrical Stimulation;Cryotherapy;Iontophoresis 4mg /ml Dexamethasone;Moist Heat;Traction;DME Instruction;Neuromuscular re-education;Patient/family education;Manual techniques;Passive range of motion;Taping    PT  Next Visit  Plan prehab for hip replacement; continue with strengthening and manual therapy; consider pool therapy for prehab    PT Home Exercise Plan LTR; hamstring stretch, tennis ball trigger point release ; abdominal breathing;Access Code: [email or web address] Upper Trapezius Stretch - 1 x daily - 7 x weekly - 1 sets - 3 reps - 20 hold.Gentle Levator Scapulae Stretch - 1 x daily - 7 x weekly - 1 sets - 3 reps - 20 hold.Shoulder extension with resistance - Neutral - 1 x daily - 7 x weekly - 3 sets - 10 reps.Scapular Retraction with Resistance - 1 x daily - 7 x weekly - 3 sets - 10 reps    Consulted and Agree with Plan of Care Patient           Patient will benefit from skilled therapeutic intervention in order to improve the following deficits and impairments:  Abnormal gait,Difficulty walking,Decreased range of motion,Decreased mobility,Decreased strength,Postural dysfunction,Pain,Increased muscle spasms  Visit Diagnosis: Chronic bilateral low back pain without sciatica  Muscle weakness (generalized)  Difficulty in walking, not elsewhere classified  Cervicalgia  Cramp and spasm     Problem List Patient Active Problem List   Diagnosis Date Noted  . Chronic bilateral low back pain without sciatica 10/02/2020  . Chronic pain syndrome 06/20/2020  . Post laminectomy syndrome 06/20/2020  . Sjogren's disease ([place]) 05/23/2020  . Primary osteoarthritis of both hands 05/23/2020  . Primary osteoarthritis of both feet 05/23/2020  . Sacroiliitis, not elsewhere classified ([place]) 12/22/2019  . Other secondary scoliosis, lumbar region 12/22/2019  . Spondylosis without myelopathy or radiculopathy, lumbar region 12/22/2019  . Cervical radiculopathy 10/18/2019  . Numbness 09/21/2019  . Bilateral carpal tunnel syndrome 09/21/2019  . High risk medication use 09/21/2019  . Memory loss 09/21/2019  . Essential hypertension 08/25/2018  . Abnormal SPEP 02/19/2018  . Hand pain 02/20/2017  .  Multiple joint pain 02/18/2017  . Right elbow pain 12/09/2016  . Disturbed cognition 06/25/2016  . Trochanteric bursitis of left hip 02/18/2016  . Sciatica, right side 10/30/2015  . Bilateral arm pain 10/10/2015  . Trochanteric bursitis of both hips 08/04/2014  . Chronic fatigue 08/04/2014  . Urinary frequency 08/04/2014  . Abnormality of gait 08/04/2014  . Unspecified visual disturbance 01/31/2013  . Transient vision disturbance 01/31/2013  . Routine general medical examination at a health care facility 11/04/2011  . Colon polyps 11/03/2011  . Hot flashes, menopausal 10/13/2011  . POSTHERPETIC NEURALGIA 10/03/2009  . DISPLCMT LUMBAR INTERVERT [place] W/O MYELOPATHY 09/05/2009  . RENAL CALCULUS, RECURRENT 09/04/2009  . Hyperlipidemia 08/31/2009  . MULTIPLE SCLEROSIS, PROGRESSIVE/RELAPSING 08/31/2009  . ALLERGIC RHINITIS 08/31/2009    Carney Living PT DPT  12/13/2020, 11:36 AM  [facility] [address] [place], [place], [address] Phone: [phone]   Fax:  [phone]  Name: Dawn [name] MRN: [id number] Date of Birth: 08-12-1945

## 2020-12-21 ENCOUNTER — Ambulatory Visit (HOSPITAL_BASED_OUTPATIENT_CLINIC_OR_DEPARTMENT_OTHER): Payer: Medicare Other | Attending: Neurology | Admitting: Physical Therapy

## 2020-12-21 ENCOUNTER — Encounter (HOSPITAL_BASED_OUTPATIENT_CLINIC_OR_DEPARTMENT_OTHER): Payer: Self-pay | Admitting: Physical Therapy

## 2020-12-21 ENCOUNTER — Other Ambulatory Visit: Payer: Self-pay

## 2020-12-21 DIAGNOSIS — G8929 Other chronic pain: Secondary | ICD-10-CM | POA: Diagnosis not present

## 2020-12-21 DIAGNOSIS — R262 Difficulty in walking, not elsewhere classified: Secondary | ICD-10-CM | POA: Insufficient documentation

## 2020-12-21 DIAGNOSIS — M542 Cervicalgia: Secondary | ICD-10-CM | POA: Insufficient documentation

## 2020-12-21 DIAGNOSIS — R252 Cramp and spasm: Secondary | ICD-10-CM | POA: Insufficient documentation

## 2020-12-21 DIAGNOSIS — M6281 Muscle weakness (generalized): Secondary | ICD-10-CM | POA: Diagnosis not present

## 2020-12-21 DIAGNOSIS — M545 Low back pain, unspecified: Secondary | ICD-10-CM | POA: Insufficient documentation

## 2020-12-21 NOTE — Therapy (Signed)
[place], [place], [address] Phone: [phone]   Fax:  [phone]  Physical Therapy Treatment  Patient Details  Name: Dawn [name] MRN: [id number] Date of Birth: Jan 29, 1946 Referring Provider (PT): Dr Arlice Colt   Encounter Date: 12/21/2020   PT End of Session - 12/21/20 1326    Visit Number 3    Number of Visits 12    Date for PT Re-Evaluation 01/16/21    Authorization Type progress note on week 10. Kx now    PT Start Time 1100    PT Stop Time 1140    PT Time Calculation (min) 40 min    Activity Tolerance Patient tolerated treatment well    Behavior During Therapy WFL for tasks assessed/performed           Past Medical History:  Diagnosis Date  . Allergy   . Dry eyes   . Endometrial polyp   . Essential hypertension 08/25/2018  . GERD (gastroesophageal reflux disease)   . History of kidney stones   . Hot flashes, menopausal 10/13/2011   Estradiol started   . Jaundice as teenager   no problems since  . Memory loss 09/21/2019     1/2 of feet numb all the time  . Multiple sclerosis ([place]) dx 2001  . Neuropathy   . Osteoarthritis   . Sjogren's syndrome ([place]) dx oct 2021   sore muscvles, dry mouth and eyes  . Vision abnormalities     Past Surgical History:  Procedure Laterality Date  . ABDOMINAL HYSTERECTOMY  2002   partial  . colonscopy  2011  . DILATATION & CURETTAGE/HYSTEROSCOPY WITH MYOSURE N/A 06/08/2020   Procedure: DILATATION & CURETTAGE/HYSTEROSCOPY/Polypectomy WITH MYOSURE;  Surgeon: Armandina Stammer, DO;  Location: [facility];  Service: Gynecology;  Laterality: N/A;  . LUMBAR FUSION  2001  . UPPER GI ENDOSCOPY  yrs ago    There were no vitals filed for this visit.   Subjective Assessment - 12/21/20 1319    Subjective Patient reports it has been looser over the past week but still consitently painful. She has been working on her exercises which has been  helping. She has had some pain on the left side as well.    How long can you stand comfortably? hurts as soon as she stands. Has to shift frequently    How long can you walk comfortably? limited community ambualtion    Currently in Pain? Yes    Pain Score 6     Pain Location Hip    Pain Orientation Right;Left    Pain Descriptors / Indicators Aching    Pain Type Chronic pain    Pain Onset More than a month ago    Pain Frequency Constant    Aggravating Factors  standing and walking    Pain Relieving Factors stretching    Effect of Pain on Daily Activities walking and standing    Multiple Pain Sites Yes    Pain Score 2    Pain Location Hip    Pain Orientation Left    Pain Descriptors / Indicators Aching    Pain Type Chronic pain    Aggravating Factors  standing and walking    Pain Relieving Factors rest                             [facility] Adult PT Treatment/Exercise - 12/21/20 0001      Lumbar  Exercises: Stretches   Active Hamstring Stretch 3 reps;20 seconds    Lower Trunk Rotation Limitations x20    Piriformis Stretch 2 reps;20 seconds      Lumbar Exercises: Supine   Other Supine Lumbar Exercises SAQ 3x10 bilateral; supine march in pain free resitance;      Manual Therapy   Manual therapy comments skilled palaption of trigger points    Soft tissue mobilization trigger point release to the upper gluteal and lumbar paraspinal.    Manual Traction LAD Grade II-III            Trigger Point Dry Needling - 12/21/20 0001    Other Dry Needling 2 spots in left gluteal; 2 spots in right lumbar paraspinal and 3 in right gluteal                PT Education - 12/21/20 1324    Education Details reviewed HEP    Person(s) Educated Patient    Methods Explanation;Demonstration;Tactile cues;Verbal cues    Comprehension Verbalized understanding;Returned demonstration;Verbal cues required;Tactile cues required            PT Short Term Goals - 12/06/20 1417       PT SHORT TERM GOAL #1   Title Patient will increase right hip flexion to 90 degrees withiut pain    Time 4    Period Weeks    Status New    Target Date 01/17/21      PT SHORT TERM GOAL #2   Title Patient will increase bilateral LE strength to 4/5    Baseline 3/5 hip flexion and 3+/5  abduction    Time 4    Period Weeks    Status New    Target Date 01/17/21      PT SHORT TERM GOAL #3   Title Patient will decrease sit to stand time by 8 seconds    Time 4    Period Weeks    Status On-going    Target Date 02/13/20             PT Long Term Goals - 12/06/20 1421      PT LONG TERM GOAL #1   Title Patient will stand for 30 min without self reported increase pain in order to stand at a kitchen sink.    Baseline has had progressive loss of ability to stand    Time 6    Period Weeks    Status New    Target Date 01/17/21      PT LONG TERM GOAL #2   Title Patient will be independnet with complete pre-hab program to improve potential surgical outcome    Time 6    Period Weeks    Status New    Target Date 01/17/21                 Plan - 12/21/20 1327    Clinical Impression Statement Patients hip mobility improved significantly compared to last visit. she reports she has been able to tolerate more exercise wise and she had been doing a littlemore. Therapy reviewed ther-ex with her. She had no increase in pain. She will see the surgeon on Wednesday. We will progress per surgeon reccomendations. She will begin auwatic therapy on her next visit.    Personal Factors and Comorbidities Comorbidity 1;Comorbidity 2    Comorbidities MS, OA    Examination-Activity Limitations Locomotion Level;Carry;Stand;Lift;Stairs;Squat    Examination-Participation Restrictions Meal Prep;Cleaning;Community Activity;Laundry;Shop    Stability/Clinical Decision Making Stable/Uncomplicated  Clinical Decision Making Low    Rehab Potential Good    PT Frequency 1x / week    PT Duration 8 weeks     PT Treatment/Interventions ADLs/Self Care Home Management;Electrical Stimulation;Cryotherapy;Iontophoresis 4mg /ml Dexamethasone;Moist Heat;Traction;DME Instruction;Neuromuscular re-education;Patient/family education;Manual techniques;Passive range of motion;Taping    PT Next Visit Plan prehab for hip replacement; continue with strengthening and manual therapy; consider pool therapy for prehab    PT Home Exercise Plan LTR; hamstring stretch, tennis ball trigger point release ; abdominal breathing;Access Code: [email or web address].Seated Upper Trapezius Stretch - 1 x daily - 7 x weekly - 1 sets - 3 reps - 20 hold.Gentle Levator Scapulae Stretch - 1 x daily - 7 x weekly - 1 sets - 3 reps - 20 hold.Shoulder extension with resistance - Neutral - 1 x daily - 7 x weekly - 3 sets - 10 reps.Scapular Retraction with Resistance - 1 x daily - 7 x weekly - 3 sets - 10 reps    Consulted and Agree with Plan of Care Patient           Patient will benefit from skilled therapeutic intervention in order to improve the following deficits and impairments:  Abnormal gait,Difficulty walking,Decreased range of motion,Decreased mobility,Decreased strength,Postural dysfunction,Pain,Increased muscle spasms  Visit Diagnosis: Chronic bilateral low back pain without sciatica  Muscle weakness (generalized)  Difficulty in walking, not elsewhere classified  Cervicalgia  Cramp and spasm     Problem List Patient Active Problem List   Diagnosis Date Noted  . Chronic bilateral low back pain without sciatica 10/02/2020  . Chronic pain syndrome 06/20/2020  . Post laminectomy syndrome 06/20/2020  . Sjogren's disease ([place]) 05/23/2020  . Primary osteoarthritis of both hands 05/23/2020  . Primary osteoarthritis of both feet 05/23/2020  . Sacroiliitis, not elsewhere classified ([place]) 12/22/2019  . Other secondary scoliosis, lumbar region 12/22/2019  . Spondylosis without myelopathy or radiculopathy, lumbar region 12/22/2019   . Cervical radiculopathy 10/18/2019  . Numbness 09/21/2019  . Bilateral carpal tunnel syndrome 09/21/2019  . High risk medication use 09/21/2019  . Memory loss 09/21/2019  . Essential hypertension 08/25/2018  . Abnormal SPEP 02/19/2018  . Hand pain 02/20/2017  . Multiple joint pain 02/18/2017  . Right elbow pain 12/09/2016  . Disturbed cognition 06/25/2016  . Trochanteric bursitis of left hip 02/18/2016  . Sciatica, right side 10/30/2015  . Bilateral arm pain 10/10/2015  . Trochanteric bursitis of both hips 08/04/2014  . Chronic fatigue 08/04/2014  . Urinary frequency 08/04/2014  . Abnormality of gait 08/04/2014  . Unspecified visual disturbance 01/31/2013  . Transient vision disturbance 01/31/2013  . Routine general medical examination at a health care facility 11/04/2011  . Colon polyps 11/03/2011  . Hot flashes, menopausal 10/13/2011  . POSTHERPETIC NEURALGIA 10/03/2009  . DISPLCMT LUMBAR INTERVERT [place] W/O MYELOPATHY 09/05/2009  . RENAL CALCULUS, RECURRENT 09/04/2009  . Hyperlipidemia 08/31/2009  . MULTIPLE SCLEROSIS, PROGRESSIVE/RELAPSING 08/31/2009  . ALLERGIC RHINITIS 08/31/2009    Carney Living PT DPT  12/21/2020, 2:10 PM  [facility] [address] [place], [place], [address] Phone: [phone]   Fax:  [phone]  Name: LEASA KINCANNON MRN: [id number] Date of Birth: July 21, 1946

## 2020-12-24 DIAGNOSIS — M1611 Unilateral primary osteoarthritis, right hip: Secondary | ICD-10-CM | POA: Diagnosis not present

## 2020-12-25 ENCOUNTER — Telehealth: Payer: Self-pay | Admitting: *Deleted

## 2020-12-25 NOTE — Telephone Encounter (Signed)
Faxed completed/signed surgical clearance form back to [facility] at [phone]. Received fax confirmation. Pt having R total hip arthroplasty. Per Dr. Felecia Shelling, pt is cleared neurologically for surgery w/ no special recommendation.

## 2020-12-28 ENCOUNTER — Ambulatory Visit (HOSPITAL_BASED_OUTPATIENT_CLINIC_OR_DEPARTMENT_OTHER): Payer: Medicare Other | Admitting: Physical Therapy

## 2020-12-28 ENCOUNTER — Other Ambulatory Visit: Payer: Self-pay

## 2020-12-28 DIAGNOSIS — M6281 Muscle weakness (generalized): Secondary | ICD-10-CM

## 2020-12-28 DIAGNOSIS — R252 Cramp and spasm: Secondary | ICD-10-CM | POA: Diagnosis not present

## 2020-12-28 DIAGNOSIS — M542 Cervicalgia: Secondary | ICD-10-CM | POA: Diagnosis not present

## 2020-12-28 DIAGNOSIS — M545 Low back pain, unspecified: Secondary | ICD-10-CM | POA: Diagnosis not present

## 2020-12-28 DIAGNOSIS — R262 Difficulty in walking, not elsewhere classified: Secondary | ICD-10-CM

## 2020-12-28 DIAGNOSIS — G8929 Other chronic pain: Secondary | ICD-10-CM | POA: Diagnosis not present

## 2020-12-30 NOTE — Therapy (Signed)
[name], [place], [address] Phone: [phone]   Fax:  [phone]  Physical Therapy Treatment  Patient Details  Name: Dawn [name] MRN: [id number] Date of Birth: Sep 21, 1945 Referring Provider (PT): Dr Arlice Colt   Encounter Date: 12/28/2020   PT End of Session - 12/30/20 0747     Visit Number 4    Number of Visits 12    Date for PT Re-Evaluation 01/16/21    Authorization Type progress note on week 10. Kx now    PT Start Time 1015    PT Stop Time 1057    PT Time Calculation (min) 42 min    Activity Tolerance Patient tolerated treatment well    Behavior During Therapy [facility] for tasks assessed/performed             Past Medical History:  Diagnosis Date   Allergy    Dry eyes    Endometrial polyp    Essential hypertension 08/25/2018   GERD (gastroesophageal reflux disease)    History of kidney stones    Hot flashes, menopausal 10/13/2011   Estradiol started    Jaundice as teenager   no problems since   Memory loss 09/21/2019     1/2 of feet numb all the time   Multiple sclerosis ([place]) dx 2001   Neuropathy    Osteoarthritis    Sjogren's syndrome ([place]) dx oct 2021   sore muscvles, dry mouth and eyes   Vision abnormalities     Past Surgical History:  Procedure Laterality Date   ABDOMINAL HYSTERECTOMY  2002   partial   colonscopy  2011   DILATATION & CURETTAGE/HYSTEROSCOPY WITH MYOSURE N/A 06/08/2020   Procedure: DILATATION & CURETTAGE/HYSTEROSCOPY/Polypectomy WITH MYOSURE;  Surgeon: Armandina Stammer, DO;  Location: [facility];  Service: Gynecology;  Laterality: N/A;   LUMBAR FUSION  2001   UPPER GI ENDOSCOPY  yrs ago    There were no vitals filed for this visit.   Subjective Assessment - 12/30/20 0744     Subjective Patient reports her hip has been sore the past few days. She saw the MD and he had to manipulate the hip. She has surgery plnaned on 01/17/2021.     Pertinent History MS    How long can you stand comfortably? hurts as soon as she stands. Has to shift frequently    How long can you walk comfortably? limited community ambualtion    Currently in Pain? Yes    Pain Score 6     Pain Location Hip    Pain Orientation Right    Pain Descriptors / Indicators Aching    Pain Type Chronic pain    Pain Onset More than a month ago    Pain Frequency Intermittent    Aggravating Factors  standing and walking    Pain Relieving Factors light exercises    Effect of Pain on Daily Activities difficutly walking and standing               Pt seen for aquatic therapy today.  Treatment took place in water 3.25-4 ft in depth at the [employer] [facility] pool. Temp of water was 91.  Pt entered/exited the pool via stairs (step through pattern) independently with bilat rail.          Warm up: heel/toe walking x4 laps across pool chest deep  side stepping x4 laps from shallow to deep;  Long stride 4 laps  Backwards walking   Exercises;  Slow march x20;  Kick back x20; Kick to the side;  squats x20; hip extension x20; hip abduction x20; Sit to stand x20;  Sitting kick out x20 each leg; sitting hop abduction   Press noodle down in the water with good posture x20; push noodle back and fourth x20   Floating push and pull x20; side to side      Pt requires buoyancy for support and to offload joints with strengthening exercises. Viscosity of the water is needed for resistance of strengthening; water current perturbations provides challenge to standing balance unsupported, requiring increased core activation.               PT Education - 12/30/20 0747     Education Details reviewed pool exercises    Person(s) Educated Patient    Methods Explanation;Demonstration;Tactile cues;Verbal cues    Comprehension Verbalized understanding;Returned demonstration;Verbal cues required;Tactile cues required              PT Short Term Goals -  12/06/20 1417       PT SHORT TERM GOAL #1   Title Patient will increase right hip flexion to 90 degrees withiut pain    Time 4    Period Weeks    Status New    Target Date 01/17/21      PT SHORT TERM GOAL #2   Title Patient will increase bilateral LE strength to 4/5    Baseline 3/5 hip flexion and 3+/5  abduction    Time 4    Period Weeks    Status New    Target Date 01/17/21      PT SHORT TERM GOAL #3   Title Patient will decrease sit to stand time by 8 seconds    Time 4    Period Weeks    Status On-going    Target Date 02/13/20               PT Long Term Goals - 12/06/20 1421       PT LONG TERM GOAL #1   Title Patient will stand for 30 min without self reported increase pain in order to stand at a kitchen sink.    Baseline has had progressive loss of ability to stand    Time 6    Period Weeks    Status New    Target Date 01/17/21      PT LONG TERM GOAL #2   Title Patient will be independnet with complete pre-hab program to improve potential surgical outcome    Time 6    Period Weeks    Status New    Target Date 01/17/21                   Plan - 12/30/20 0748     Clinical Impression Statement Patient tolerated treatment well. She had no significant increase in pain with exercises in the pool. She reports she did much better in the pool then she has been able to do out of the pool. We will continue to progress exercises in the pool as tolerated for pre-hab prior to her surgery    Personal Factors and Comorbidities Comorbidity 1;Comorbidity 2    Comorbidities MS, OA    Examination-Activity Limitations Locomotion Level;Carry;Stand;Lift;Stairs;Squat    Examination-Participation Restrictions Meal Prep;Cleaning;Community Activity;Laundry;Shop    Stability/Clinical Decision Making Stable/Uncomplicated    Clinical Decision Making Low    Rehab Potential Good    PT Frequency 1x / week    PT Duration 8 weeks  PT Treatment/Interventions ADLs/Self Care  Home Management;Electrical Stimulation;Cryotherapy;Iontophoresis 4mg /ml Dexamethasone;Moist Heat;Traction;DME Instruction;Neuromuscular re-education;Patient/family education;Manual techniques;Passive range of motion;Taping    PT Next Visit Plan prehab for hip replacement; continue with strengthening and manual therapy; consider pool therapy for prehab    PT Home Exercise Plan LTR; hamstring stretch, tennis ball trigger point release ; abdominal breathing;Access Code: [email or web address] Upper Trapezius Stretch - 1 x daily - 7 x weekly - 1 sets - 3 reps - 20 holdGentle Levator Scapulae Stretch - 1 x daily - 7 x weekly - 1 sets - 3 reps - 20 holdShoulder extension with resistance - Neutral - 1 x daily - 7 x weekly - 3 sets - 10 repsScapular Retraction with Resistance - 1 x daily - 7 x weekly - 3 sets - 10 reps    Consulted and Agree with Plan of Care Patient             Patient will benefit from skilled therapeutic intervention in order to improve the following deficits and impairments:  Abnormal gait, Difficulty walking, Decreased range of motion, Decreased mobility, Decreased strength, Postural dysfunction, Pain, Increased muscle spasms  Visit Diagnosis: Chronic bilateral low back pain without sciatica  Muscle weakness (generalized)  Difficulty in walking, not elsewhere classified  Cervicalgia  Cramp and spasm     Problem List Patient Active Problem List   Diagnosis Date Noted   Chronic bilateral low back pain without sciatica 10/02/2020   Chronic pain syndrome 06/20/2020   Post laminectomy syndrome 06/20/2020   Sjogren's disease ([place]) 05/23/2020   Primary osteoarthritis of both hands 05/23/2020   Primary osteoarthritis of both feet 05/23/2020   Sacroiliitis, not elsewhere classified ([place]) 12/22/2019   Other secondary scoliosis, lumbar region 12/22/2019   Spondylosis without myelopathy or radiculopathy, lumbar region 12/22/2019   Cervical radiculopathy 10/18/2019   Numbness  09/21/2019   Bilateral carpal tunnel syndrome 09/21/2019   High risk medication use 09/21/2019   Memory loss 09/21/2019   Essential hypertension 08/25/2018   Abnormal SPEP 02/19/2018   Hand pain 02/20/2017   Multiple joint pain 02/18/2017   Right elbow pain 12/09/2016   Disturbed cognition 06/25/2016   Trochanteric bursitis of left hip 02/18/2016   Sciatica, right side 10/30/2015   Bilateral arm pain 10/10/2015   Trochanteric bursitis of both hips 08/04/2014   Chronic fatigue 08/04/2014   Urinary frequency 08/04/2014   Abnormality of gait 08/04/2014   Unspecified visual disturbance 01/31/2013   Transient vision disturbance 01/31/2013   Routine general medical examination at a health care facility 11/04/2011   Colon polyps 11/03/2011   Hot flashes, menopausal 10/13/2011   POSTHERPETIC NEURALGIA 10/03/2009   DISPLCMT LUMBAR INTERVERT [place] W/O MYELOPATHY 09/05/2009   RENAL CALCULUS, RECURRENT 09/04/2009   Hyperlipidemia 08/31/2009   MULTIPLE SCLEROSIS, PROGRESSIVE/RELAPSING 08/31/2009   ALLERGIC RHINITIS 08/31/2009    Carney Living PT DPT  12/30/2020, 7:56 AM  [facility] [address] [place], [place], [address] Phone: [phone]   Fax:  [phone]  Name: Dawn [name] MRN: [id number] Date of Birth: [birth year]

## 2021-01-01 ENCOUNTER — Ambulatory Visit: Payer: Self-pay | Admitting: Student

## 2021-01-03 NOTE — Progress Notes (Signed)
DUE TO COVID-19 ONLY ONE VISITOR IS ALLOWED TO COME WITH YOU AND STAY IN THE WAITING ROOM ONLY DURING PRE OP AND PROCEDURE DAY OF SURGERY. THE 1 VISITOR  MAY VISIT WITH YOU AFTER SURGERY IN YOUR PRIVATE ROOM DURING VISITING HOURS ONLY!  YOU NEED TO HAVE A COVID 19 TEST ON_______ @_______ , THIS TEST MUST BE DONE BEFORE SURGERY,  COVID TESTING SITE [address] [place] [address], IT IS ON THE RIGHT GOING OUT [address] APPROXIMATELY  2 MINUTES PAST [employer] ON THE RIGHT. ONCE YOUR COVID TEST IS COMPLETED,  PLEASE BEGIN THE QUARANTINE INSTRUCTIONS AS OUTLINED IN YOUR HANDOUT.                Dawn [name]  01/03/2021   Your procedure is scheduled on:              01/17/2021   Report to [facility] Main  Entrance   Report to admitting at    [phone]     Call this number if you have problems the morning of surgery [phone]    REMEMBER: NO  SOLID FOOD CANDY OR GUM AFTER MIDNIGHT. CLEAR LIQUIDS UNTIL   0415am       . NOTHING BY MOUTH EXCEPT CLEAR LIQUIDS UNTIL     0415am   . PLEASE FINISH ENSURE DRINK PER SURGEON ORDER  WHICH NEEDS TO BE COMPLETED AT  0415am     .      CLEAR LIQUID DIET   Foods Allowed                                                                    Coffee and tea, regular and decaf                            Fruit ices (not with fruit pulp)                                      Iced Popsicles                                    Carbonated beverages, regular and diet                                    Cranberry, grape and apple juices Sports drinks like Gatorade Lightly seasoned clear broth or consume(fat free) Sugar, honey syrup ___________________________________________________________________      BRUSH YOUR TEETH MORNING OF SURGERY AND RINSE YOUR MOUTH OUT, NO CHEWING GUM CANDY OR MINTS.     Take these medicines the morning of surgery with A SIP OF WATER:   Prilosec   DO NOT TAKE ANY DIABETIC MEDICATIONS DAY OF YOUR  SURGERY                               You may not have any metal on your body including hair pins and  piercings  Do not wear jewelry, make-up, lotions, powders or perfumes, deodorant             Do not wear nail polish on your fingernails.  Do not shave  48 hours prior to surgery.              Men may shave face and neck.   Do not bring valuables to the hospital. [place].  Contacts, dentures or bridgework may not be worn into surgery.  Leave suitcase in the car. After surgery it may be brought to your room.     Patients discharged the day of surgery will not be allowed to drive home. IF YOU ARE HAVING SURGERY AND GOING HOME THE SAME DAY, YOU MUST HAVE AN ADULT TO DRIVE YOU HOME AND BE WITH YOU FOR 24 HOURS. YOU MAY GO HOME BY TAXI OR UBER OR ORTHERWISE, BUT AN ADULT MUST ACCOMPANY YOU HOME AND STAY WITH YOU FOR 24 HOURS.  Name and phone number of your driver:  Special Instructions: N/A              Please read over the following fact sheets you were given: _____________________________________________________________________  [facility] - Preparing for Surgery Before surgery, you can play an important role.  Because skin is not sterile, your skin needs to be as free of germs as possible.  You can reduce the number of germs on your skin by washing with CHG (chlorahexidine gluconate) soap before surgery.  CHG is an antiseptic cleaner which kills germs and bonds with the skin to continue killing germs even after washing. Please DO NOT use if you have an allergy to CHG or antibacterial soaps.  If your skin becomes reddened/irritated stop using the CHG and inform your nurse when you arrive at Short Stay. Do not shave (including legs and underarms) for at least 48 hours prior to the first CHG shower.  You may shave your face/neck. Please follow these instructions carefully:  1.  Shower with CHG Soap the night before surgery and the   morning of Surgery.  2.  If you choose to wash your hair, wash your hair first as usual with your  normal  shampoo.  3.  After you shampoo, rinse your hair and body thoroughly to remove the  shampoo.                           4.  Use CHG as you would any other liquid soap.  You can apply chg directly  to the skin and wash                       Gently with a scrungie or clean washcloth.  5.  Apply the CHG Soap to your body ONLY FROM THE NECK DOWN.   Do not use on face/ open                           Wound or open sores. Avoid contact with eyes, ears mouth and genitals (private parts).                       Wash face,  Genitals (private parts) with your normal soap.             6.  Wash thoroughly, paying special attention to the area where your surgery  will be performed.  7.  Thoroughly rinse your body with warm water from the neck down.  8.  DO NOT shower/wash with your normal soap after using and rinsing off  the CHG Soap.                9.  Pat yourself dry with a clean towel.            10.  Wear clean pajamas.            11.  Place clean sheets on your bed the night of your first shower and do not  sleep with pets. Day of Surgery : Do not apply any lotions/deodorants the morning of surgery.  Please wear clean clothes to the hospital/surgery center.  FAILURE TO FOLLOW THESE INSTRUCTIONS MAY RESULT IN THE CANCELLATION OF YOUR SURGERY PATIENT SIGNATURE_________________________________  NURSE SIGNATURE__________________________________  ________________________________________________________________________

## 2021-01-04 ENCOUNTER — Ambulatory Visit (HOSPITAL_BASED_OUTPATIENT_CLINIC_OR_DEPARTMENT_OTHER): Payer: Medicare Other | Admitting: Physical Therapy

## 2021-01-07 ENCOUNTER — Encounter (HOSPITAL_COMMUNITY)
Admission: RE | Admit: 2021-01-07 | Discharge: 2021-01-07 | Disposition: A | Discharge status: Home / Self Care | Payer: Medicare Other | Source: Ambulatory Visit | Attending: Orthopedic Surgery | Admitting: Orthopedic Surgery

## 2021-01-07 ENCOUNTER — Encounter (HOSPITAL_COMMUNITY): Payer: Self-pay

## 2021-01-07 ENCOUNTER — Other Ambulatory Visit: Payer: Self-pay

## 2021-01-07 DIAGNOSIS — Z01812 Encounter for preprocedural laboratory examination: Secondary | ICD-10-CM | POA: Diagnosis not present

## 2021-01-07 HISTORY — DX: Personal history of other diseases of the digestive system: Z87.19

## 2021-01-07 HISTORY — DX: Cerebral infarction, unspecified: I63.9

## 2021-01-07 HISTORY — DX: Dizziness and giddiness: R42

## 2021-01-07 LAB — URINALYSIS, ROUTINE W REFLEX MICROSCOPIC
Bacteria, UA: NONE SEEN
Bilirubin Urine: NEGATIVE
Glucose, UA: NEGATIVE mg/dL
Hgb urine dipstick: NEGATIVE
Ketones, ur: NEGATIVE mg/dL
Leukocytes,Ua: NEGATIVE
Nitrite: POSITIVE — AB
Protein, ur: NEGATIVE mg/dL
Specific Gravity, Urine: 1.01 (ref 1.005–1.030)
pH: 5 (ref 5.0–8.0)

## 2021-01-07 LAB — CBC
HCT: 42 % (ref 36.0–46.0)
Hemoglobin: 13.6 g/dL (ref 12.0–15.0)
MCH: 29.6 pg (ref 26.0–34.0)
MCHC: 32.4 g/dL (ref 30.0–36.0)
MCV: 91.5 fL (ref 80.0–100.0)
Platelets: 217 10*3/uL (ref 150–400)
RBC: 4.59 MIL/uL (ref 3.87–5.11)
RDW: 14.3 % (ref 11.5–15.5)
WBC: 5.5 10*3/uL (ref 4.0–10.5)
nRBC: 0 % (ref 0.0–0.2)

## 2021-01-07 LAB — COMPREHENSIVE METABOLIC PANEL
ALT: 20 U/L (ref 0–44)
AST: 21 U/L (ref 15–41)
Albumin: 4.5 g/dL (ref 3.5–5.0)
Alkaline Phosphatase: 113 U/L (ref 38–126)
Anion gap: 6 (ref 5–15)
BUN: 20 mg/dL (ref 8–23)
CO2: 26 mmol/L (ref 22–32)
Calcium: 9.4 mg/dL (ref 8.9–10.3)
Chloride: 106 mmol/L (ref 98–111)
Creatinine, Ser: 0.75 mg/dL (ref 0.44–1.00)
GFR, Estimated: >60 mL/min (ref >60)
Glucose, Bld: 94 mg/dL (ref 70–99)
Potassium: 4 mmol/L (ref 3.5–5.1)
Sodium: 138 mmol/L (ref 135–145)
Total Bilirubin: 0.5 mg/dL (ref 0.3–1.2)
Total Protein: 6.7 g/dL (ref 6.5–8.1)

## 2021-01-07 LAB — SURGICAL PCR SCREEN
MRSA, PCR: NEGATIVE
Staphylococcus aureus: NEGATIVE

## 2021-01-07 LAB — PROTIME-INR
INR: 1 (ref 0.8–1.2)
Prothrombin Time: 13 seconds (ref 11.4–15.2)

## 2021-01-07 NOTE — Progress Notes (Addendum)
Anesthesia Review:  PCP: [place] 11/29/20  Cardiologist : none  Neuro- richard [name]  12/25/20- Telephone note - clearance for surgery  Chest x-ray : EKG : 05/30/2020 Echo : Stress test: Cardiac Cath :  Activity level: cannot do a flight of stairs without difficulty  Sleep Study/ CPAP : none  Fasting Blood Sugar :      / Checks Blood Sugar -- times a day:   Blood Thinner/ Instructions /Last Dose: ASA / Instructions/ Last Dose :   PT has MS usees walker  U/A done 01/07/21 faxed to DR Swinteck.  PEr pt - mni stroke was found incidentally on MRI  PT reports herniated discs in back and vertigo.  Need to be careful with positioning per pt .  Dinah Ngetichi- clearance dated 01/08/21 on chart

## 2021-01-08 ENCOUNTER — Telehealth: Payer: Self-pay | Admitting: Neurology

## 2021-01-08 ENCOUNTER — Ambulatory Visit: Payer: Self-pay | Admitting: Student

## 2021-01-08 ENCOUNTER — Telehealth: Payer: Self-pay | Admitting: Family

## 2021-01-08 NOTE — H&P (Signed)
TOTAL HIP ADMISSION H&P  Patient is admitted for right total hip arthroplasty.  Subjective:  Chief Complaint: right hip pain  HPI: Dawn [name], 75 y.o. female, has a history of pain and functional disability in the right hip(s) due to arthritis and patient has failed non-surgical conservative treatments for greater than 12 weeks to include NSAID's and/or analgesics and activity modification.  Onset of symptoms was gradual starting 3 years ago with gradually worsening course since that time.The patient noted no past surgery on the right hip(s).  Patient currently rates pain in the right hip at 8 out of 10 with activity. Patient has worsening of pain with activity and weight bearing, pain that interfers with activities of daily living, and pain with passive range of motion. Patient has evidence of subchondral cysts, subchondral sclerosis, and joint space narrowing by imaging studies. This condition presents safety issues increasing the risk of falls. There is no current active infection.  Patient Active Problem List   Diagnosis Date Noted   Chronic bilateral low back pain without sciatica 10/02/2020   Chronic pain syndrome 06/20/2020   Post laminectomy syndrome 06/20/2020   Sjogren's disease ([place]) 05/23/2020   Primary osteoarthritis of both hands 05/23/2020   Primary osteoarthritis of both feet 05/23/2020   Sacroiliitis, not elsewhere classified ([place]) 12/22/2019   Other secondary scoliosis, lumbar region 12/22/2019   Spondylosis without myelopathy or radiculopathy, lumbar region 12/22/2019   Cervical radiculopathy 10/18/2019   Numbness 09/21/2019   Bilateral carpal tunnel syndrome 09/21/2019   High risk medication use 09/21/2019   Memory loss 09/21/2019   Essential hypertension 08/25/2018   Abnormal SPEP 02/19/2018   Hand pain 02/20/2017   Multiple joint pain 02/18/2017   Right elbow pain 12/09/2016   Disturbed cognition 06/25/2016   Trochanteric bursitis of left hip 02/18/2016    Sciatica, right side 10/30/2015   Bilateral arm pain 10/10/2015   Trochanteric bursitis of both hips 08/04/2014   Chronic fatigue 08/04/2014   Urinary frequency 08/04/2014   Abnormality of gait 08/04/2014   Unspecified visual disturbance 01/31/2013   Transient vision disturbance 01/31/2013   Routine general medical examination at a health care facility 11/04/2011   Colon polyps 11/03/2011   Hot flashes, menopausal 10/13/2011   POSTHERPETIC NEURALGIA 10/03/2009   DISPLCMT LUMBAR INTERVERT DISC W/O MYELOPATHY 09/05/2009   RENAL CALCULUS, RECURRENT 09/04/2009   Hyperlipidemia 08/31/2009   MULTIPLE SCLEROSIS, PROGRESSIVE/RELAPSING 08/31/2009   ALLERGIC RHINITIS 08/31/2009   Past Medical History:  Diagnosis Date   Allergy    Dry eyes    Endometrial polyp    Essential hypertension 08/25/2018   GERD (gastroesophageal reflux disease)    History of hiatal hernia    History of kidney stones    Hot flashes, menopausal 10/13/2011   Estradiol started    Jaundice as teenager   no problems since   Memory loss 09/21/2019     1/2 of feet numb all the time   Multiple sclerosis ([place]) dx 2001   Neuropathy    Neuropathy    bilateral feet   Osteoarthritis    Sjogren's syndrome ([place]) dx oct 2021   sore muscvles, dry mouth and eyes   Stroke [facility])    Vertigo    Vision abnormalities     Past Surgical History:  Procedure Laterality Date   ABDOMINAL HYSTERECTOMY  2002   partial   colonscopy  2011   DILATATION & CURETTAGE/HYSTEROSCOPY WITH MYOSURE N/A 06/08/2020   Procedure: DILATATION & CURETTAGE/HYSTEROSCOPY/Polypectomy WITH MYOSURE;  Surgeon: Langley Gauss  A, DO;  Location: [place];  Service: Gynecology;  Laterality: N/A;   LUMBAR FUSION  2001   UPPER GI ENDOSCOPY  yrs ago    Current Outpatient Medications  Medication Sig Dispense Refill Last Dose   acetaminophen (TYLENOL) 500 MG tablet Take 500 mg by mouth every 6 (six) hours as needed for moderate pain.       aspirin EC 81 MG tablet Take 81 mg by mouth in the morning.      CALCIUM-VITAMIN D PO Take 1 tablet by mouth in the morning.      Camphor-Menthol-Methyl Sal (SALONPAS) 3.07-26-08 % PTCH Place 1 patch onto the skin daily as needed (hip pain).      clonazePAM (KLONOPIN) 0.5 MG tablet Take 1 tablet (0.5 mg total) by mouth at bedtime. 90 tablet 1    ezetimibe (ZETIA) 10 MG tablet Take 1 tablet (10 mg total) by mouth daily. 90 tablet 3    fexofenadine (ALLEGRA) 180 MG tablet Take 180 mg by mouth at bedtime.       Flaxseed, Linseed, (FLAXSEED OIL PO) Take 5-10 mLs by mouth 3 (three) times a week.      leflunomide (ARAVA) 20 MG tablet Take 1 tablet (20 mg total) by mouth daily. (Patient taking differently: Take 20 mg by mouth in the morning.) 90 tablet 4    lidocaine (LMX) 4 % cream Apply 1 application topically at bedtime as needed (neuropathy). With lavender      losartan (COZAAR) 100 MG tablet Take 1 tablet (100 mg total) by mouth daily. (Patient taking differently: Take 100 mg by mouth in the morning.) 90 tablet 1    meloxicam (MOBIC) 15 MG tablet Take 15 mg by mouth in the morning.      modafinil (PROVIGIL) 200 MG tablet Take 1 tablet (200 mg total) by mouth every morning. 90 tablet 1    Omega-3 300 MG CAPS Take 300 mg by mouth in the morning.      omeprazole (PRILOSEC) 40 MG capsule TAKE 1 CAPSULE(40 MG) BY MOUTH DAILY (Patient taking differently: Take 40 mg by mouth in the morning.) 90 capsule 1    solifenacin (VESICARE) 5 MG tablet Take 1 tablet (5 mg total) by mouth daily. (Patient taking differently: Take 5 mg by mouth at bedtime.) 90 tablet 3    No current facility-administered medications for this visit.   No Known Allergies  Social History   Tobacco Use   Smoking status: Former    Packs/day: 0.50    Years: 15.00    Pack years: 7.50    Types: Cigarettes    Quit date: 08/05/1983    Years since quitting: 37.4   Smokeless tobacco: Never  Substance Use Topics   Alcohol use: Yes     Alcohol/week: 0.0 standard drinks    Comment: rarely    Family History  Problem Relation Age of Onset   Heart failure Mother    Heart failure Father    Arthritis Other    Hypertension Other      Review of Systems  Musculoskeletal:  Positive for arthralgias.  All other systems reviewed and are negative.  Objective:  Physical Exam Eyes:     Pupils: Pupils are equal, round, and reactive to light.  Cardiovascular:     Rate and Rhythm: Normal rate.     Pulses: Normal pulses.  Pulmonary:     Effort: Pulmonary effort is normal.  Abdominal:     Palpations: Abdomen is soft.  Tenderness: There is no abdominal tenderness.  Genitourinary:    Comments: Deferred Musculoskeletal:     Cervical back: Normal range of motion.     Comments: Pain with terminal flexion and rotation right hip  Skin:    General: Skin is warm.  Neurological:     Mental Status: She is alert and oriented to person, place, and time.  Psychiatric:        Mood and Affect: Mood normal.    Vital signs in last 24 hours: @VSRANGES @  Labs:   Estimated body mass index is 32.92 kg/m as calculated from the following:   Height as of 01/07/21: 5\' 2"  (1.575 m).   Weight as of 01/07/21: 81.6 kg.   Imaging Review Plain radiographs demonstrate severe degenerative joint disease of the right hip(s). The bone quality appears to be adequate for age and reported activity level.      Assessment/Plan:  End stage arthritis, right hip(s)  The patient history, physical examination, clinical judgement of the provider and imaging studies are consistent with end stage degenerative joint disease of the right hip(s) and total hip arthroplasty is deemed medically necessary. The treatment options including medical management, injection therapy, arthroscopy and arthroplasty were discussed at length. The risks and benefits of total hip arthroplasty were presented and reviewed. The risks due to aseptic loosening, infection,  stiffness, dislocation/subluxation,  thromboembolic complications and other imponderables were discussed.  The patient acknowledged the explanation, agreed to proceed with the plan and consent was signed. Patient is being admitted for inpatient treatment for surgery, pain control, PT, OT, prophylactic antibiotics, VTE prophylaxis, progressive ambulation and ADL's and discharge planning.The patient is planning to be discharged  home after overnight observation

## 2021-01-08 NOTE — H&P (View-Only) (Signed)
TOTAL HIP ADMISSION H&P  Patient is admitted for right total hip arthroplasty.  Subjective:  Chief Complaint: right hip pain  HPI: Dawn [name], 75 y.o. female, has a history of pain and functional disability in the right hip(s) due to arthritis and patient has failed non-surgical conservative treatments for greater than 12 weeks to include NSAID's and/or analgesics and activity modification.  Onset of symptoms was gradual starting 3 years ago with gradually worsening course since that time.The patient noted no past surgery on the right hip(s).  Patient currently rates pain in the right hip at 8 out of 10 with activity. Patient has worsening of pain with activity and weight bearing, pain that interfers with activities of daily living, and pain with passive range of motion. Patient has evidence of subchondral cysts, subchondral sclerosis, and joint space narrowing by imaging studies. This condition presents safety issues increasing the risk of falls. There is no current active infection.  Patient Active Problem List   Diagnosis Date Noted   Chronic bilateral low back pain without sciatica 10/02/2020   Chronic pain syndrome 06/20/2020   Post laminectomy syndrome 06/20/2020   Sjogren's disease ([place]) 05/23/2020   Primary osteoarthritis of both hands 05/23/2020   Primary osteoarthritis of both feet 05/23/2020   Sacroiliitis, not elsewhere classified ([place]) 12/22/2019   Other secondary scoliosis, lumbar region 12/22/2019   Spondylosis without myelopathy or radiculopathy, lumbar region 12/22/2019   Cervical radiculopathy 10/18/2019   Numbness 09/21/2019   Bilateral carpal tunnel syndrome 09/21/2019   High risk medication use 09/21/2019   Memory loss 09/21/2019   Essential hypertension 08/25/2018   Abnormal SPEP 02/19/2018   Hand pain 02/20/2017   Multiple joint pain 02/18/2017   Right elbow pain 12/09/2016   Disturbed cognition 06/25/2016   Trochanteric bursitis of left hip 02/18/2016    Sciatica, right side 10/30/2015   Bilateral arm pain 10/10/2015   Trochanteric bursitis of both hips 08/04/2014   Chronic fatigue 08/04/2014   Urinary frequency 08/04/2014   Abnormality of gait 08/04/2014   Unspecified visual disturbance 01/31/2013   Transient vision disturbance 01/31/2013   Routine general medical examination at a health care facility 11/04/2011   Colon polyps 11/03/2011   Hot flashes, menopausal 10/13/2011   POSTHERPETIC NEURALGIA 10/03/2009   DISPLCMT LUMBAR INTERVERT DISC W/O MYELOPATHY 09/05/2009   RENAL CALCULUS, RECURRENT 09/04/2009   Hyperlipidemia 08/31/2009   MULTIPLE SCLEROSIS, PROGRESSIVE/RELAPSING 08/31/2009   ALLERGIC RHINITIS 08/31/2009   Past Medical History:  Diagnosis Date   Allergy    Dry eyes    Endometrial polyp    Essential hypertension 08/25/2018   GERD (gastroesophageal reflux disease)    History of hiatal hernia    History of kidney stones    Hot flashes, menopausal 10/13/2011   Estradiol started    Jaundice as teenager   no problems since   Memory loss 09/21/2019     1/2 of feet numb all the time   Multiple sclerosis ([place]) dx 2001   Neuropathy    Neuropathy    bilateral feet   Osteoarthritis    Sjogren's syndrome ([place]) dx oct 2021   sore muscvles, dry mouth and eyes   Stroke [facility])    Vertigo    Vision abnormalities     Past Surgical History:  Procedure Laterality Date   ABDOMINAL HYSTERECTOMY  2002   partial   colonscopy  2011   DILATATION & CURETTAGE/HYSTEROSCOPY WITH MYOSURE N/A 06/08/2020   Procedure: DILATATION & CURETTAGE/HYSTEROSCOPY/Polypectomy WITH MYOSURE;  Surgeon: Langley Gauss  A, DO;  Location: [place];  Service: Gynecology;  Laterality: N/A;   LUMBAR FUSION  2001   UPPER GI ENDOSCOPY  yrs ago    Current Outpatient Medications  Medication Sig Dispense Refill Last Dose   acetaminophen (TYLENOL) 500 MG tablet Take 500 mg by mouth every 6 (six) hours as needed for moderate pain.       aspirin EC 81 MG tablet Take 81 mg by mouth in the morning.      CALCIUM-VITAMIN D PO Take 1 tablet by mouth in the morning.      Camphor-Menthol-Methyl Sal (SALONPAS) 3.07-26-08 % PTCH Place 1 patch onto the skin daily as needed (hip pain).      clonazePAM (KLONOPIN) 0.5 MG tablet Take 1 tablet (0.5 mg total) by mouth at bedtime. 90 tablet 1    ezetimibe (ZETIA) 10 MG tablet Take 1 tablet (10 mg total) by mouth daily. 90 tablet 3    fexofenadine (ALLEGRA) 180 MG tablet Take 180 mg by mouth at bedtime.       Flaxseed, Linseed, (FLAXSEED OIL PO) Take 5-10 mLs by mouth 3 (three) times a week.      leflunomide (ARAVA) 20 MG tablet Take 1 tablet (20 mg total) by mouth daily. (Patient taking differently: Take 20 mg by mouth in the morning.) 90 tablet 4    lidocaine (LMX) 4 % cream Apply 1 application topically at bedtime as needed (neuropathy). With lavender      losartan (COZAAR) 100 MG tablet Take 1 tablet (100 mg total) by mouth daily. (Patient taking differently: Take 100 mg by mouth in the morning.) 90 tablet 1    meloxicam (MOBIC) 15 MG tablet Take 15 mg by mouth in the morning.      modafinil (PROVIGIL) 200 MG tablet Take 1 tablet (200 mg total) by mouth every morning. 90 tablet 1    Omega-3 300 MG CAPS Take 300 mg by mouth in the morning.      omeprazole (PRILOSEC) 40 MG capsule TAKE 1 CAPSULE(40 MG) BY MOUTH DAILY (Patient taking differently: Take 40 mg by mouth in the morning.) 90 capsule 1    solifenacin (VESICARE) 5 MG tablet Take 1 tablet (5 mg total) by mouth daily. (Patient taking differently: Take 5 mg by mouth at bedtime.) 90 tablet 3    No current facility-administered medications for this visit.   No Known Allergies  Social History   Tobacco Use   Smoking status: Former    Packs/day: 0.50    Years: 15.00    Pack years: 7.50    Types: Cigarettes    Quit date: 08/05/1983    Years since quitting: 37.4   Smokeless tobacco: Never  Substance Use Topics   Alcohol use: Yes     Alcohol/week: 0.0 standard drinks    Comment: rarely    Family History  Problem Relation Age of Onset   Heart failure Mother    Heart failure Father    Arthritis Other    Hypertension Other      Review of Systems  Musculoskeletal:  Positive for arthralgias.  All other systems reviewed and are negative.  Objective:  Physical Exam Eyes:     Pupils: Pupils are equal, round, and reactive to light.  Cardiovascular:     Rate and Rhythm: Normal rate.     Pulses: Normal pulses.  Pulmonary:     Effort: Pulmonary effort is normal.  Abdominal:     Palpations: Abdomen is soft.  Tenderness: There is no abdominal tenderness.  Genitourinary:    Comments: Deferred Musculoskeletal:     Cervical back: Normal range of motion.     Comments: Pain with terminal flexion and rotation right hip  Skin:    General: Skin is warm.  Neurological:     Mental Status: She is alert and oriented to person, place, and time.  Psychiatric:        Mood and Affect: Mood normal.    Vital signs in last 24 hours: @VSRANGES @  Labs:   Estimated body mass index is 32.92 kg/m as calculated from the following:   Height as of 01/07/21: 5\' 2"  (1.575 m).   Weight as of 01/07/21: 81.6 kg.   Imaging Review Plain radiographs demonstrate severe degenerative joint disease of the right hip(s). The bone quality appears to be adequate for age and reported activity level.      Assessment/Plan:  End stage arthritis, right hip(s)  The patient history, physical examination, clinical judgement of the provider and imaging studies are consistent with end stage degenerative joint disease of the right hip(s) and total hip arthroplasty is deemed medically necessary. The treatment options including medical management, injection therapy, arthroscopy and arthroplasty were discussed at length. The risks and benefits of total hip arthroplasty were presented and reviewed. The risks due to aseptic loosening, infection,  stiffness, dislocation/subluxation,  thromboembolic complications and other imponderables were discussed.  The patient acknowledged the explanation, agreed to proceed with the plan and consent was signed. Patient is being admitted for inpatient treatment for surgery, pain control, PT, OT, prophylactic antibiotics, VTE prophylaxis, progressive ambulation and ADL's and discharge planning.The patient is planning to be discharged  home after overnight observation

## 2021-01-08 NOTE — Telephone Encounter (Signed)
Preoperative Form faxed back to [facility] Fax: [phone]

## 2021-01-08 NOTE — Telephone Encounter (Signed)
Pt called wanting to know if she could have an injection in her left hip before June 30th, 2022. Please advise

## 2021-01-08 NOTE — Telephone Encounter (Signed)
Called pt. Advised MD agreed to fit her in for injection. Scheduled work in visit for 01/09/21 at 12pm, check in 1130am.

## 2021-01-08 NOTE — Telephone Encounter (Signed)
Called pt back. Getting R hip replaced on June 30th w/ Dr. Lyla Glassing. Requesting to come in for Dr. Felecia Shelling to do L hip injection. She spoke w/ Dr. Lyla Glassing and he approved this. Hoping this will help her recover better after hip replacement. She will go right into rehab after surgery. Advised there are no open appt before then but I will discuss with MD and call her back.

## 2021-01-08 NOTE — Telephone Encounter (Signed)
Right total Hip arthroplasty pre-op check list completed.Please fax to [facility] at [phone] as requested.tel # [phone]

## 2021-01-09 ENCOUNTER — Encounter: Payer: Self-pay | Admitting: Neurology

## 2021-01-09 ENCOUNTER — Ambulatory Visit (INDEPENDENT_AMBULATORY_CARE_PROVIDER_SITE_OTHER): Payer: Medicare Other | Admitting: Neurology

## 2021-01-09 VITALS — BP 170/82 | HR 68 | Ht 62.0 in | Wt 181.0 lb

## 2021-01-09 DIAGNOSIS — M7061 Trochanteric bursitis, right hip: Secondary | ICD-10-CM

## 2021-01-09 DIAGNOSIS — G35 Multiple sclerosis: Secondary | ICD-10-CM | POA: Diagnosis not present

## 2021-01-09 DIAGNOSIS — M7062 Trochanteric bursitis, left hip: Secondary | ICD-10-CM | POA: Diagnosis not present

## 2021-01-09 DIAGNOSIS — M2550 Pain in unspecified joint: Secondary | ICD-10-CM | POA: Diagnosis not present

## 2021-01-09 NOTE — Progress Notes (Signed)
[facility]  PATIENT: Dawn [name] DOB: June 06, 1946  REFERRING CLINICIAN: Aura Dials HISTORY FROM: Patient    HISTORICAL  CHIEF COMPLAINT:  Chief Complaint  Patient presents with   Injections    RM 11, alone. Here for work in injection in L hip. Having R hip replacement surgery 01/17/21.    HISTORY OF PRESENT ILLNESS:  Dawn [name] is a 75 y.o. woman who was diagnosed with multiple sclerosis in 2001.    She has bilateral hip pain.    Update  01/09/2021 She is reporting right > left hip pain.she had received more benefit from the trochanteric bursa injections than she did with epidural steroids.  She is now scheduled to have a right total hip replacement.   PT with needling has helped.   She also does Tai Chi and swimming at the [facility].    Her MS is stable without exacerbations.   She is on Leflunomide [employer] had not covered [place] well for her in the past.).  She tolerates it well.   Her gait is reduced but no falls.   She uses a cane due to the right leg giving out on her now and then.  Occasionally she notes numbness in the hands.  She gets some painful tingling in her legs.  At night, she gets painful tingling in her legs.  She drops things at times.   She has fatigue and sleeps poorly many nights.  Clonazepam does help the insomnia and spasticity though not as much as he used to.  Cognition is generally stable with mild memory difficulties.  Mood is doing well.  She sees Dr. Estanislado Pandy for her arthritis and was diagnosed with Sjogren's (postive SSA).  She has a dry mouth.  Tramadol had helped her pain in the past but she rarely takes as it makes her sleepy.       _______________________________________________ MS History:    In 1994, she had an episode of severe vertigo with gait ataxia. An MRI at that time was reportedly normal. The symptoms were felt to be due to allergies. About 7 years later she had vertigo, gait ataxia and diplopia.  Also her left leg was giving out. Short after that she had a hysterectomy. Postoperatively, she was numb in both legs and she had a lumbar puncture which showed changes consistent with MS. Then, an MRI of the brain was performed showing changes consistent with MS. She was referred to Dr. Erling Cruz who her on Betaseron. Last year, she switched from Betaseron to Tecfidera, in the hope that she would be able to avoid shots as she was having severe skin reactions with knots.    However, she had a lot of difficulty tolerating Tecfidera and swithced to [place] in early 2016 and to [place] late 2016.  Switched to Leflunomide for insurance reasons in 2019.    MRI brain 09/13/2018 shows multiple T2/flair hyperintense foci in the hemispheres.  The pattern is consistent with chronic demyelinating plaque associated with multiple sclerosis though some foci likely also represent chronic microvascular ischemic change.  None of the foci enhance or appear to be acute and there is no change compared to the 2017 MRI.      Remote left cerebellar infarction.      There is a normal enhancement pattern and no acute findings.  REVIEW OF SYSTEMS:  Constitutional: No fevers, chills, sweats, or change in appetite.   Reports fatigue Eyes: No visual changes, double vision, eye pain Ear, nose and throat:  No hearing loss, ear pain, nasal congestion, sore throat Cardiovascular: No chest pain, palpitations Respiratory:  No shortness of breath at rest or with exertion.   No wheezes GastrointestinaI: No nausea, vomiting, diarrhea, abdominal pain, fecal incontinence Genitourinary:  see above. Musculoskeletal:  Pain in left greater than right hip Integumentary: No rash, pruritus.    Neurological: as above Psychiatric: No depression at this time.  No anxiety Endocrine: No palpitations, diaphoresis, change in appetite, change in weigh or increased thirst Hematologic/Lymphatic:  No anemia, purpura, petechiae. Allergic/Immunologic: No  itchy/runny eyes, nasal congestion, recent allergic reactions, rashes  ALLERGIES: No Known Allergies  HOME MEDICATIONS: Outpatient Medications Prior to Visit  Medication Sig Dispense Refill   acetaminophen (TYLENOL) 500 MG tablet Take 500 mg by mouth every 6 (six) hours as needed for moderate pain.     aspirin EC 81 MG tablet Take 81 mg by mouth in the morning.     CALCIUM-VITAMIN D PO Take 1 tablet by mouth in the morning.     Camphor-Menthol-Methyl Sal (SALONPAS) 3.07-26-08 % PTCH Place 1 patch onto the skin daily as needed (hip pain).     clonazePAM (KLONOPIN) 0.5 MG tablet Take 1 tablet (0.5 mg total) by mouth at bedtime. 90 tablet 1   ezetimibe (ZETIA) 10 MG tablet Take 1 tablet (10 mg total) by mouth daily. 90 tablet 3   fexofenadine (ALLEGRA) 180 MG tablet Take 180 mg by mouth at bedtime.      Flaxseed, Linseed, (FLAXSEED OIL PO) Take 5-10 mLs by mouth 3 (three) times a week.     leflunomide (ARAVA) 20 MG tablet Take 1 tablet (20 mg total) by mouth daily. (Patient taking differently: Take 20 mg by mouth in the morning.) 90 tablet 4   lidocaine (LMX) 4 % cream Apply 1 application topically at bedtime as needed (neuropathy). With lavender     losartan (COZAAR) 100 MG tablet Take 1 tablet (100 mg total) by mouth daily. (Patient taking differently: Take 100 mg by mouth in the morning.) 90 tablet 1   meloxicam (MOBIC) 15 MG tablet Take 15 mg by mouth in the morning.     modafinil (PROVIGIL) 200 MG tablet Take 1 tablet (200 mg total) by mouth every morning. 90 tablet 1   Omega-3 300 MG CAPS Take 300 mg by mouth in the morning.     omeprazole (PRILOSEC) 40 MG capsule TAKE 1 CAPSULE(40 MG) BY MOUTH DAILY (Patient taking differently: Take 40 mg by mouth in the morning.) 90 capsule 1   solifenacin (VESICARE) 5 MG tablet Take 1 tablet (5 mg total) by mouth daily. (Patient taking differently: Take 5 mg by mouth at bedtime.) 90 tablet 3   No facility-administered medications prior to visit.     PAST MEDICAL HISTORY: Past Medical History:  Diagnosis Date   Allergy    Dry eyes    Endometrial polyp    Essential hypertension 08/25/2018   GERD (gastroesophageal reflux disease)    History of hiatal hernia    History of kidney stones    Hot flashes, menopausal 10/13/2011   Estradiol started    Jaundice as teenager   no problems since   Memory loss 09/21/2019     1/2 of feet numb all the time   Multiple sclerosis ([place]) dx 2001   Neuropathy    Neuropathy    bilateral feet   Osteoarthritis    Sjogren's syndrome ([place]) dx oct 2021   sore muscvles, dry mouth and eyes   Stroke ([place])  Vertigo    Vision abnormalities     PAST SURGICAL HISTORY: Past Surgical History:  Procedure Laterality Date   ABDOMINAL HYSTERECTOMY  2002   partial   colonscopy  2011   DILATATION & CURETTAGE/HYSTEROSCOPY WITH MYOSURE N/A 06/08/2020   Procedure: DILATATION & CURETTAGE/HYSTEROSCOPY/Polypectomy WITH MYOSURE;  Surgeon: Armandina Stammer, DO;  Location: [facility];  Service: Gynecology;  Laterality: N/A;   LUMBAR FUSION  2001   UPPER GI ENDOSCOPY  yrs ago    FAMILY HISTORY: Family History  Problem Relation Age of Onset   Heart failure Mother    Heart failure Father    Arthritis Other    Hypertension Other     SOCIAL HISTORY:  Social History   Socioeconomic History   Marital status: Widowed    Spouse name: Not on file   Number of children: Not on file   Years of education: Not on file   Highest education level: Not on file  Occupational History   Occupation: retired  Tobacco Use   Smoking status: Former    Packs/day: 0.50    Years: 15.00    Pack years: 7.50    Types: Cigarettes    Quit date: 08/05/1983    Years since quitting: 37.4   Smokeless tobacco: Never  Vaping Use   Vaping Use: Never used  Substance and Sexual Activity   Alcohol use: Yes    Alcohol/week: 0.0 standard drinks    Comment: rarely   Drug use: No   Sexual activity: Not Currently     Birth control/protection: Post-menopausal  Other Topics Concern   Not on file  Social History Narrative   Regular Exercise-no   Widowed   Retired   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     Tobacco use, amount per day now: former   Past tobacco use, amount per day: 1/2-1 packet   How many years did you use tobacco: 12 years   Alcohol use (drinks per week): 0   Diet: mediterranean based   Do you drink/eat things with caffeine: yes   Marital status:        widow                          What year were you married? 1987   Do you live in a house, apartment, assisted living, condo, trailer, etc.? house   Is it one or more stories? 1 story   How many persons live in your home? 1   Do you have pets in your home?( please list) no   Current or past profession: Science writer    Do you exercise?          yes                        Type and how often? Tai chi and pt therapy, stretches etc....   Do you have a living will? yes   Do you have a DNR form?      no                             If not, do you want to discuss one? yes   Do  you have signed POA/HPOA forms?      no                  If so, please bring to you appointment   Social Determinants of Health   Financial Resource Strain: Not on file  Food Insecurity: Not on file  Transportation Needs: Not on file  Physical Activity: Not on file  Stress: Not on file  Social Connections: Not on file  Intimate Partner Violence: Not on file     PHYSICAL EXAM  Vitals:   01/09/21 1111  BP: (!) 170/82  Pulse: 68  Weight: 181 lb (82.1 kg)  Height: '5\' 2"'  (1.575 m)    Body mass index is 33.11 kg/m.   General: The patient is well-developed and well-nourished and in no acute distress  Musculoskeletal: She has right > left  trochanteric bursa tenderness.   She will be  having surgery on the right soon.  Neurologic Exam  Mental status: The patient is alert and oriented x 3 at the time of the examination.   apparently normal attention span, stm and concentration ability.   Speech is normal.      Cranial nerves: Extraocular movements are full.  Facial strength and sensory are normal.  Hearing appeared to be symmetric  Motor:  Muscle bulk is normal but tone is symmetric, more on the left.. Strength is 4/5 in median innervated and 4+/5 and ulnar innervated intrinsic hand muscles  Gait and station: Station is normal.  Gait is wide and tandem gait is poor.  Romberg negative.      DIAGNOSTIC DATA (LABS, IMAGING, TESTING) - I reviewed patient records, labs, notes, testing and imaging myself where available.  Lab Results  Component Value Date   WBC 5.5 01/07/2021   HGB 13.6 01/07/2021   HCT 42.0 01/07/2021   MCV 91.5 01/07/2021   PLT 217 01/07/2021      Component Value Date/Time   NA 138 01/07/2021 1122   NA 139 04/04/2020 1132   K 4.0 01/07/2021 1122   CL 106 01/07/2021 1122   CO2 26 01/07/2021 1122   GLUCOSE 94 01/07/2021 1122   BUN 20 01/07/2021 1122   BUN 24 04/04/2020 1132   CREATININE 0.75 01/07/2021 1122   CREATININE 0.77 11/29/2020 1341   CALCIUM 9.4 01/07/2021 1122   PROT 6.7 01/07/2021 1122   PROT 6.1 10/02/2020 1607   ALBUMIN 4.5 01/07/2021 1122   ALBUMIN 4.2 10/02/2020 1607   AST 21 01/07/2021 1122   AST 20 03/09/2018 1342   ALT 20 01/07/2021 1122   ALT 22 03/09/2018 1342   ALKPHOS 113 01/07/2021 1122   BILITOT 0.5 01/07/2021 1122   BILITOT <0.2 10/02/2020 1607   BILITOT 0.4 03/09/2018 1342   GFRNONAA >60 01/07/2021 1122   GFRNONAA 88 11/19/2020 0945   GFRAA 102 11/19/2020 0945   Lab Results  Component Value Date   CHOL 257 (H) 11/19/2020   HDL 44 (L) 11/19/2020   LDLCALC 174 (H) 11/19/2020   LDLDIRECT 130.2 11/03/2011   TRIG 231 (H) 11/19/2020   CHOLHDL 5.8 (H) 11/19/2020   No results found for: HGBA1C No results  found for: VITAMINB12 Lab Results  Component Value Date   TSH 4.46 11/19/2020       ASSESSMENT AND PLAN  Trochanteric bursitis of both hips  Multiple sclerosis (HCC)  Multiple joint pain   1.    She will continue leflunomide for MS.      2.    Continue  exercises and physical therapy.   3.    Left  trochanteric bursae injections with 40 mg Depo-Medrol in 3 cc Marcaine using sterile technique.   She tolerated the injections well 4.    Continue  clonazepam and modafinil.  5.    Return to see me in about 6 months but is advised to call sooner if she has new or worsening neurologic symptoms.   Tasheem Elms A. Felecia Shelling, MD, PhD 2/49/3241, 9:91 PM Certified in Neurology, Clinical Neurophysiology, Sleep Medicine, Pain Medicine and Neuroimaging  [facility] [address] [place], [place] [address] [phone]-' \

## 2021-01-11 ENCOUNTER — Ambulatory Visit (HOSPITAL_BASED_OUTPATIENT_CLINIC_OR_DEPARTMENT_OTHER): Payer: Medicare Other | Admitting: Physical Therapy

## 2021-01-16 ENCOUNTER — Ambulatory Visit (INDEPENDENT_AMBULATORY_CARE_PROVIDER_SITE_OTHER): Payer: Medicare Other | Admitting: Family

## 2021-01-16 ENCOUNTER — Encounter: Payer: Self-pay | Admitting: Family

## 2021-01-16 ENCOUNTER — Encounter (HOSPITAL_BASED_OUTPATIENT_CLINIC_OR_DEPARTMENT_OTHER): Payer: Medicare Other | Admitting: Physical Therapy

## 2021-01-16 ENCOUNTER — Other Ambulatory Visit: Payer: Self-pay

## 2021-01-16 VITALS — BP 138/70 | HR 80 | Temp 97.1°F | Resp 16 | Ht 62.0 in | Wt 178.6 lb

## 2021-01-16 DIAGNOSIS — M25551 Pain in right hip: Secondary | ICD-10-CM | POA: Diagnosis not present

## 2021-01-16 DIAGNOSIS — G35 Multiple sclerosis: Secondary | ICD-10-CM

## 2021-01-16 NOTE — Anesthesia Preprocedure Evaluation (Addendum)
Anesthesia Evaluation  Patient identified by MRN, date of birth, ID band Patient awake    Reviewed: Allergy & Precautions, H&P , NPO status , Patient's Chart, lab work & pertinent test results  Airway Mallampati: III  TM Distance: >3 FB Neck ROM: Full    Dental no notable dental hx. (+) Teeth Intact, Dental Advisory Given   Pulmonary neg pulmonary ROS, former smoker,    Pulmonary exam normal breath sounds clear to auscultation       Cardiovascular Exercise Tolerance: Good hypertension, Pt. on medications  Rhythm:Regular Rate:Normal     Neuro/Psych  Neuromuscular disease CVA negative psych ROS   GI/Hepatic Neg liver ROS, hiatal hernia, GERD  Medicated,  Endo/Other  negative endocrine ROS  Renal/GU negative Renal ROS  negative genitourinary   Musculoskeletal  (+) Arthritis , Osteoarthritis,    Abdominal   Peds  Hematology negative hematology ROS (+)   Anesthesia Other Findings   Reproductive/Obstetrics negative OB ROS                            Anesthesia Physical Anesthesia Plan  ASA: 3  Anesthesia Plan: General   Post-op Pain Management:    Induction: Intravenous  PONV Risk Score and Plan: 4 or greater and Ondansetron, Dexamethasone and Treatment may vary due to age or medical condition  Airway Management Planned: Oral ETT  Additional Equipment:   Intra-op Plan:   Post-operative Plan: Extubation in OR  Informed Consent: I have reviewed the patients History and Physical, chart, labs and discussed the procedure including the risks, benefits and alternatives for the proposed anesthesia with the patient or authorized representative who has indicated his/her understanding and acceptance.     Dental advisory given  Plan Discussed with: CRNA  Anesthesia Plan Comments:        Anesthesia Quick Evaluation

## 2021-01-16 NOTE — Progress Notes (Signed)
Provider: Marlowe Sax FNP-C  Amonie Wisser, [name], NP  Patient Care Team: Avira Tillison, [name], NP as PCP - General (Family Medicine) [name], MD as Consulting Physician (Orthopedic Surgery) [name], [name], MD (Neurology) [name], [name], [place] (Optometry) [name], NP as Nurse Practitioner (Obstetrics and Gynecology) [name], MD as Consulting Physician (Dermatology)  Extended Emergency Contact Information Primary Emergency Contact: Cephus Richer) Home Phone: [phone] Mobile Phone: [phone] Relation: Relative Secondary Emergency Contact: [facility] Phone: [phone] Mobile Phone: [phone] Relation: Friend  Code Status:  DNR Goals of care: Advanced Directive information Advanced Directives 01/16/2021  Does Patient Have a Medical Advance Directive? Yes  Type of Advance Directive Living will  Does patient want to make changes to medical advance directive? No - Patient declined  Copy of [place] in Chart? -     Chief Complaint  Patient presents with   Acute Visit    Requesting home health and [facility] referral due to having hip surgery.    HPI:  Pt is a 75 y.o. female seen today for an acute visit for for home health and [facility] referral.States has upcoming right hip surgery tomorrow 01/17/2021 to be done by Dr.Swinteck Aaron Edelman. On chart review,plans for right total hip Arthroplasty noted. States currently has outpatient [place] Physical Therapist.does have someone who assist at with her ADL's at home.States does not cook much now days due to pain and MS.has a shower bench and grab bars in her shower able to give herself a shower. I have discussed with her that Home health referral will depend on how she progresses after surgery whether she will need in home therapy ,outpatient or skilled rehab.she verbalized understanding.    Past Medical History:  Diagnosis Date   Allergy    Dry eyes    Endometrial polyp     Essential hypertension 08/25/2018   GERD (gastroesophageal reflux disease)    History of hiatal hernia    History of kidney stones    Hot flashes, menopausal 10/13/2011   Estradiol started    Jaundice as teenager   no problems since   Memory loss 09/21/2019     1/2 of feet numb all the time   Multiple sclerosis ([place]) dx 2001   Neuropathy    Neuropathy    bilateral feet   Osteoarthritis    Sjogren's syndrome ([place]) dx oct 2021   sore muscvles, dry mouth and eyes   Stroke [facility])    Vertigo    Vision abnormalities    Past Surgical History:  Procedure Laterality Date   ABDOMINAL HYSTERECTOMY  2002   partial   colonscopy  2011   DILATATION & CURETTAGE/HYSTEROSCOPY WITH MYOSURE N/A 06/08/2020   Procedure: DILATATION & CURETTAGE/HYSTEROSCOPY/Polypectomy WITH MYOSURE;  Surgeon: Armandina Stammer, DO;  Location: [facility];  Service: Gynecology;  Laterality: N/A;   LUMBAR FUSION  2001   UPPER GI ENDOSCOPY  yrs ago    No Known Allergies  Outpatient Encounter Medications as of 01/16/2021  Medication Sig   acetaminophen (TYLENOL) 500 MG tablet Take 500 mg by mouth every 6 (six) hours as needed for moderate pain.   aspirin EC 81 MG tablet Take 81 mg by mouth in the morning.   CALCIUM-VITAMIN D PO Take 1 tablet by mouth in the morning.   Camphor-Menthol-Methyl Sal (SALONPAS) 3.07-26-08 % PTCH Place 1 patch onto the skin daily as needed (hip pain).   clonazePAM (KLONOPIN) 0.5 MG tablet Take 1 tablet (0.5 mg total)  by mouth at bedtime.   ezetimibe (ZETIA) 10 MG tablet Take 1 tablet (10 mg total) by mouth daily.   fexofenadine (ALLEGRA) 180 MG tablet Take 180 mg by mouth at bedtime.    Flaxseed, Linseed, (FLAXSEED OIL PO) Take 5-10 mLs by mouth 3 (three) times a week.   leflunomide (ARAVA) 20 MG tablet Take 1 tablet (20 mg total) by mouth daily. (Patient taking differently: Take 20 mg by mouth in the morning.)   lidocaine (LMX) 4 % cream Apply 1 application topically at  bedtime as needed (neuropathy). With lavender   losartan (COZAAR) 100 MG tablet Take 1 tablet (100 mg total) by mouth daily. (Patient taking differently: Take 100 mg by mouth in the morning.)   meloxicam (MOBIC) 15 MG tablet Take 15 mg by mouth in the morning.   modafinil (PROVIGIL) 200 MG tablet Take 1 tablet (200 mg total) by mouth every morning.   Omega-3 300 MG CAPS Take 300 mg by mouth in the morning.   omeprazole (PRILOSEC) 40 MG capsule TAKE 1 CAPSULE(40 MG) BY MOUTH DAILY (Patient taking differently: Take 40 mg by mouth in the morning.)   solifenacin (VESICARE) 5 MG tablet Take 1 tablet (5 mg total) by mouth daily. (Patient taking differently: Take 5 mg by mouth at bedtime.)   No facility-administered encounter medications on file as of 01/16/2021.    Review of Systems  Constitutional:  Negative for appetite change, chills, fatigue, fever and unexpected weight change.  Eyes:  Negative for pain, discharge, redness, itching and visual disturbance.  Respiratory:  Negative for cough, chest tightness, shortness of breath and wheezing.   Cardiovascular:  Negative for chest pain, palpitations and leg swelling.  Gastrointestinal:  Negative for abdominal distention, abdominal pain, blood in stool, constipation, diarrhea, nausea and vomiting.  Musculoskeletal:  Positive for arthralgias and gait problem. Negative for back pain, joint swelling, myalgias, neck pain and neck stiffness.       Generalized muscle weakness   Skin:  Negative for color change, pallor, rash and wound.  Neurological:  Negative for dizziness, syncope, speech difficulty, weakness, light-headedness, numbness and headaches.   Immunization History  Administered Date(s) Administered   Fluad Quad(high Dose 65+) 04/01/2019   Influenza Whole 05/14/2011   Influenza, High Dose Seasonal PF 04/30/2017, 05/15/2018, 05/18/2020   PFIZER(Purple Top)SARS-COV-2 Vaccination 08/27/2019, 09/21/2019, 03/07/2020   Pneumococcal Conjugate-13  08/30/2018   Pneumococcal Polysaccharide-23 03/21/2009, 04/20/2020   Td 12/04/2009   Zoster, Live 11/29/2009   Pertinent  Health Maintenance Due  Topic Date Due   DEXA SCAN  Never done   INFLUENZA VACCINE  02/18/2021   MAMMOGRAM  03/14/2022   PNA vac Low Risk Adult  Completed   Fall Risk  01/16/2021 11/29/2020 11/21/2020 10/11/2020 09/06/2020  Falls in the past year? 0 0 0 0 0  Number falls in past yr: 0 0 0 0 0  Injury with Fall? 0 0 - 0 0  Risk for fall due to : No Fall Risks - - - -  Risk for fall due to: Comment - - - - -  Follow up Falls evaluation completed - - - -   Functional Status Survey:    Vitals:   01/16/21 0929  BP: 138/70  Pulse: 80  Resp: 16  Temp: (!) 97.1 F (36.2 C)  SpO2: 98%  Weight: 178 lb 9.6 oz (81 kg)  Height: 5\' 2"  (1.575 m)   Body mass index is 32.67 kg/m. Physical Exam Vitals reviewed.  Constitutional:  General: She is not in acute distress.    Appearance: Normal appearance. She is obese. She is not ill-appearing or diaphoretic.  HENT:     Head: Normocephalic.  Eyes:     General: No scleral icterus.       Right eye: No discharge.        Left eye: No discharge.     Extraocular Movements: Extraocular movements intact.     Conjunctiva/sclera: Conjunctivae normal.     Pupils: Pupils are equal, round, and reactive to light.  Cardiovascular:     Rate and Rhythm: Normal rate and regular rhythm.     Pulses: Normal pulses.     Heart sounds: Normal heart sounds. No murmur heard.   No friction rub. No gallop.  Pulmonary:     Effort: Pulmonary effort is normal. No respiratory distress.     Breath sounds: Normal breath sounds. No wheezing, rhonchi or rales.  Chest:     Chest wall: No tenderness.  Abdominal:     General: Bowel sounds are normal. There is no distension.     Palpations: Abdomen is soft. There is no mass.     Tenderness: There is no abdominal tenderness. There is no right CVA tenderness, left CVA tenderness, guarding or rebound.   Musculoskeletal:        General: No swelling or tenderness.     Right lower leg: No edema.     Left lower leg: No edema.     Comments: Unsteady gait ambulates with a cane.Has walker at home.   Neurological:     Mental Status: She is alert and oriented to person, place, and time.     Cranial Nerves: No cranial nerve deficit.     Motor: No weakness.     Coordination: Coordination normal.     Gait: Gait abnormal.  Psychiatric:        Mood and Affect: Mood normal.        Speech: Speech normal.        Behavior: Behavior normal.        Thought Content: Thought content normal.        Judgment: Judgment normal.    Labs reviewed: Recent Labs    11/19/20 0945 11/29/20 1341 01/07/21 1122  NA 141 139 138  K 4.4 4.6 4.0  CL 104 105 106  CO2 26 24 26   GLUCOSE 85 87 94  BUN 17 19 20   CREATININE 0.67 0.77 0.75  CALCIUM 9.8 9.6 9.4   Recent Labs    04/04/20 1132 10/02/20 1607 11/19/20 0945 01/07/21 1122  AST 38 28 24 21   ALT 65* 69* 25 20  ALKPHOS 192* 161*  --  113  BILITOT 0.2 <0.2 0.4 0.5  PROT 6.7 6.1 6.1 6.7  ALBUMIN 4.5 4.2  --  4.5   Recent Labs    04/04/20 1132 06/08/20 1030 10/02/20 1607 11/19/20 0945 01/07/21 1122  WBC 5.0   < > 9.4 3.8 5.5  NEUTROABS 3.3  --  7.6* 2,778  --   HGB 13.2   < > 13.4 14.0 13.6  HCT 40.9   < > 39.8 42.8 42.0  MCV 92   < > 88 89.7 91.5  PLT 211   < > 223 183 217   < > = values in this interval not displayed.   Lab Results  Component Value Date   TSH 4.46 11/19/2020   No results found for: HGBA1C Lab Results  Component Value Date   CHOL 257 (  H) 11/19/2020   HDL 44 (L) 11/19/2020   LDLCALC 174 (H) 11/19/2020   LDLDIRECT 130.2 11/03/2011   TRIG 231 (H) 11/19/2020   CHOLHDL 5.8 (H) 11/19/2020    Significant Diagnostic Results in last 30 days:  No results found.  Assessment/Plan 1. Right hip pain Scheduled for right hip total Arthroplasty tomorrow 01/17/2021 with Dr.Swinteck Aaron Edelman.requested Home health / [facility] referral  today states did not the process for Post surgery.I've discussed with her that Home health referral will be determined depend at the Hospital depending how she does post -op if outpatient/in home or skilled Nursing is required.Verbalized understanding. - will follow up after Hospital discharge.  - continue current pain regimen   2. MULTIPLE SCLEROSIS, PROGRESSIVE/RELAPSING - will require assistance with her ADL's post -op states currently has someone assisting her at home. - continue follow up with Neurologist.  Family/ staff Communication: Reviewed plan of care with patient verbalized understanding.  Labs/tests ordered: None   Next Appointment: Has upcoming appointment 02/2021   Sandrea Hughs, NP

## 2021-01-17 ENCOUNTER — Observation Stay (HOSPITAL_COMMUNITY)
Admission: RE | Admit: 2021-01-17 | Discharge: 2021-01-18 | Disposition: A | Discharge status: Home / Self Care | Payer: Medicare Other | Source: Ambulatory Visit | Attending: Orthopedic Surgery | Admitting: Orthopedic Surgery

## 2021-01-17 ENCOUNTER — Ambulatory Visit (HOSPITAL_COMMUNITY): Payer: Medicare Other | Admitting: Physician Assistant

## 2021-01-17 ENCOUNTER — Ambulatory Visit (HOSPITAL_COMMUNITY): Payer: Medicare Other

## 2021-01-17 ENCOUNTER — Encounter (HOSPITAL_COMMUNITY): Payer: Self-pay | Admitting: Orthopedic Surgery

## 2021-01-17 ENCOUNTER — Encounter (HOSPITAL_COMMUNITY)
Admission: RE | Disposition: A | Discharge status: Home / Self Care | Payer: Self-pay | Source: Ambulatory Visit | Attending: Orthopedic Surgery

## 2021-01-17 ENCOUNTER — Ambulatory Visit (HOSPITAL_COMMUNITY): Payer: Medicare Other | Admitting: Anesthesiology

## 2021-01-17 DIAGNOSIS — Z79899 Other long term (current) drug therapy: Secondary | ICD-10-CM | POA: Diagnosis not present

## 2021-01-17 DIAGNOSIS — Z09 Encounter for follow-up examination after completed treatment for conditions other than malignant neoplasm: Secondary | ICD-10-CM

## 2021-01-17 DIAGNOSIS — Z471 Aftercare following joint replacement surgery: Secondary | ICD-10-CM | POA: Diagnosis not present

## 2021-01-17 DIAGNOSIS — Z96641 Presence of right artificial hip joint: Secondary | ICD-10-CM | POA: Diagnosis not present

## 2021-01-17 DIAGNOSIS — Z7982 Long term (current) use of aspirin: Secondary | ICD-10-CM | POA: Insufficient documentation

## 2021-01-17 DIAGNOSIS — Z8673 Personal history of transient ischemic attack (TIA), and cerebral infarction without residual deficits: Secondary | ICD-10-CM | POA: Insufficient documentation

## 2021-01-17 DIAGNOSIS — Z87891 Personal history of nicotine dependence: Secondary | ICD-10-CM | POA: Diagnosis not present

## 2021-01-17 DIAGNOSIS — S72041A Displaced fracture of base of neck of right femur, initial encounter for closed fracture: Secondary | ICD-10-CM

## 2021-01-17 DIAGNOSIS — Z96649 Presence of unspecified artificial hip joint: Secondary | ICD-10-CM

## 2021-01-17 DIAGNOSIS — I10 Essential (primary) hypertension: Secondary | ICD-10-CM | POA: Diagnosis not present

## 2021-01-17 DIAGNOSIS — M1611 Unilateral primary osteoarthritis, right hip: Secondary | ICD-10-CM | POA: Diagnosis not present

## 2021-01-17 DIAGNOSIS — K219 Gastro-esophageal reflux disease without esophagitis: Secondary | ICD-10-CM | POA: Diagnosis not present

## 2021-01-17 DIAGNOSIS — E785 Hyperlipidemia, unspecified: Secondary | ICD-10-CM | POA: Diagnosis not present

## 2021-01-17 HISTORY — PX: TOTAL HIP ARTHROPLASTY: SHX124

## 2021-01-17 LAB — TYPE AND SCREEN
ABO/RH(D): O POS
Antibody Screen: NEGATIVE

## 2021-01-17 LAB — ABO/RH: ABO/RH(D): O POS

## 2021-01-17 SURGERY — ARTHROPLASTY, HIP, TOTAL, ANTERIOR APPROACH
Anesthesia: General | Site: Hip | Laterality: Right

## 2021-01-17 MED ORDER — METOCLOPRAMIDE HCL 5 MG/ML IJ SOLN
5.0000 mg | Freq: Three times a day (TID) | INTRAMUSCULAR | Status: DC | PRN
Start: 2021-01-17 — End: 2021-01-18
  Administered 2021-01-17: 10 mg via INTRAVENOUS
  Filled 2021-01-17: qty 2

## 2021-01-17 MED ORDER — DEXAMETHASONE SODIUM PHOSPHATE 10 MG/ML IJ SOLN
INTRAMUSCULAR | Status: AC
  Filled 2021-01-17: qty 1

## 2021-01-17 MED ORDER — LACTATED RINGERS IV BOLUS: 250.0000 mL | Freq: Once | INTRAVENOUS | Status: DC

## 2021-01-17 MED ORDER — LIDOCAINE 2% (20 MG/ML) 5 ML SYRINGE
INTRAMUSCULAR | Status: AC
  Filled 2021-01-17: qty 5

## 2021-01-17 MED ORDER — DEXMEDETOMIDINE (PRECEDEX) IN NS 20 MCG/5ML (4 MCG/ML) IV SYRINGE
PREFILLED_SYRINGE | INTRAVENOUS | Status: AC
  Filled 2021-01-17: qty 5

## 2021-01-17 MED ORDER — METHOCARBAMOL 500 MG IVPB - SIMPLE MED
500.0000 mg | Freq: Four times a day (QID) | INTRAVENOUS | Status: DC | PRN
  Filled 2021-01-17: qty 50

## 2021-01-17 MED ORDER — WATER FOR IRRIGATION, STERILE IR SOLN
Status: DC | PRN
  Administered 2021-01-17: 2000 mL

## 2021-01-17 MED ORDER — BUPIVACAINE-EPINEPHRINE (PF) 0.25% -1:200000 IJ SOLN
INTRAMUSCULAR | Status: AC
  Filled 2021-01-17: qty 30

## 2021-01-17 MED ORDER — MIDAZOLAM HCL 2 MG/2ML IJ SOLN
INTRAMUSCULAR | Status: AC
  Filled 2021-01-17: qty 2

## 2021-01-17 MED ORDER — 0.9 % SODIUM CHLORIDE (POUR BTL) OPTIME
TOPICAL | Status: DC | PRN
  Administered 2021-01-17: 1000 mL

## 2021-01-17 MED ORDER — DARIFENACIN HYDROBROMIDE ER 7.5 MG PO TB24
7.5000 mg | ORAL_TABLET | Freq: Every day | ORAL | Status: DC
  Administered 2021-01-17 – 2021-01-18 (×2): 7.5 mg via ORAL
  Filled 2021-01-17 (×2): qty 1

## 2021-01-17 MED ORDER — HYDROCODONE-ACETAMINOPHEN 5-325 MG PO TABS: 1.0000 | ORAL_TABLET | ORAL | 0 refills | Status: DC | PRN

## 2021-01-17 MED ORDER — POLYETHYLENE GLYCOL 3350 17 G PO PACK: 17.0000 g | PACK | Freq: Every day | ORAL | Status: DC | PRN

## 2021-01-17 MED ORDER — ACETAMINOPHEN 10 MG/ML IV SOLN
1000.0000 mg | Freq: Once | INTRAVENOUS | Status: AC
  Administered 2021-01-17: 1000 mg via INTRAVENOUS
  Filled 2021-01-17: qty 100

## 2021-01-17 MED ORDER — ROCURONIUM BROMIDE 10 MG/ML (PF) SYRINGE
PREFILLED_SYRINGE | INTRAVENOUS | Status: AC
  Filled 2021-01-17: qty 10

## 2021-01-17 MED ORDER — CEFAZOLIN SODIUM-DEXTROSE 2-4 GM/100ML-% IV SOLN
2.0000 g | Freq: Four times a day (QID) | INTRAVENOUS | Status: AC
  Administered 2021-01-17 (×2): 2 g via INTRAVENOUS
  Filled 2021-01-17 (×2): qty 100

## 2021-01-17 MED ORDER — HYDROMORPHONE HCL 1 MG/ML IJ SOLN
INTRAMUSCULAR | Status: AC
  Administered 2021-01-17: 0.5 mg via INTRAVENOUS
  Filled 2021-01-17: qty 1

## 2021-01-17 MED ORDER — ONDANSETRON HCL 4 MG PO TABS: 4.0000 mg | ORAL_TABLET | Freq: Three times a day (TID) | ORAL | 0 refills | Status: DC | PRN

## 2021-01-17 MED ORDER — PROPOFOL 10 MG/ML IV BOLUS
INTRAVENOUS | Status: AC
  Filled 2021-01-17: qty 20

## 2021-01-17 MED ORDER — HYDROCODONE-ACETAMINOPHEN 5-325 MG PO TABS
1.0000 | ORAL_TABLET | ORAL | Status: DC | PRN
  Administered 2021-01-18: 2 via ORAL
  Filled 2021-01-17: qty 2

## 2021-01-17 MED ORDER — ISOPROPYL ALCOHOL 70 % SOLN
Status: DC | PRN
  Administered 2021-01-17: 1 via TOPICAL

## 2021-01-17 MED ORDER — HYDROCODONE-ACETAMINOPHEN 7.5-325 MG PO TABS: 1.0000 | ORAL_TABLET | ORAL | Status: DC | PRN

## 2021-01-17 MED ORDER — HYDROMORPHONE HCL 1 MG/ML IJ SOLN
0.2500 mg | INTRAMUSCULAR | Status: DC | PRN
  Administered 2021-01-17 (×2): 0.5 mg via INTRAVENOUS

## 2021-01-17 MED ORDER — ROCURONIUM BROMIDE 10 MG/ML (PF) SYRINGE
PREFILLED_SYRINGE | INTRAVENOUS | Status: DC | PRN
  Administered 2021-01-17: 70 mg via INTRAVENOUS

## 2021-01-17 MED ORDER — ONDANSETRON HCL 4 MG/2ML IJ SOLN
INTRAMUSCULAR | Status: DC | PRN
  Administered 2021-01-17: 4 mg via INTRAVENOUS

## 2021-01-17 MED ORDER — AMISULPRIDE (ANTIEMETIC) 5 MG/2ML IV SOLN
10.0000 mg | Freq: Once | INTRAVENOUS | Status: AC | PRN
  Administered 2021-01-17: 10 mg via INTRAVENOUS
  Filled 2021-01-17: qty 4

## 2021-01-17 MED ORDER — POVIDONE-IODINE 10 % EX SWAB: 2.0000 "application " | Freq: Once | CUTANEOUS | Status: DC

## 2021-01-17 MED ORDER — SUGAMMADEX SODIUM 200 MG/2ML IV SOLN
INTRAVENOUS | Status: DC | PRN
  Administered 2021-01-17: 200 mg via INTRAVENOUS

## 2021-01-17 MED ORDER — DEXAMETHASONE SODIUM PHOSPHATE 10 MG/ML IJ SOLN
INTRAMUSCULAR | Status: DC | PRN
  Administered 2021-01-17: 10 mg via INTRAVENOUS

## 2021-01-17 MED ORDER — SODIUM CHLORIDE (PF) 0.9 % IJ SOLN
INTRAMUSCULAR | Status: AC
  Filled 2021-01-17: qty 30

## 2021-01-17 MED ORDER — SENNA 8.6 MG PO TABS
1.0000 | ORAL_TABLET | Freq: Two times a day (BID) | ORAL | Status: DC
  Administered 2021-01-17 (×2): 8.6 mg via ORAL
  Filled 2021-01-17 (×3): qty 1

## 2021-01-17 MED ORDER — DIPHENHYDRAMINE HCL 12.5 MG/5ML PO ELIX: 12.5000 mg | ORAL_SOLUTION | ORAL | Status: DC | PRN

## 2021-01-17 MED ORDER — MENTHOL 3 MG MT LOZG: 1.0000 | LOZENGE | OROMUCOSAL | Status: DC | PRN

## 2021-01-17 MED ORDER — CHLORHEXIDINE GLUCONATE 0.12 % MT SOLN
15.0000 mL | Freq: Once | OROMUCOSAL | Status: AC
  Administered 2021-01-17: 15 mL via OROMUCOSAL

## 2021-01-17 MED ORDER — ASPIRIN 81 MG PO CHEW: 81.0000 mg | CHEWABLE_TABLET | Freq: Two times a day (BID) | ORAL | 0 refills | Status: AC

## 2021-01-17 MED ORDER — ONDANSETRON HCL 4 MG PO TABS
4.0000 mg | ORAL_TABLET | Freq: Four times a day (QID) | ORAL | Status: DC | PRN
  Administered 2021-01-17: 4 mg via ORAL
  Filled 2021-01-17: qty 1

## 2021-01-17 MED ORDER — SODIUM CHLORIDE (PF) 0.9 % IJ SOLN
INTRAMUSCULAR | Status: DC | PRN
  Administered 2021-01-17: 30 mL

## 2021-01-17 MED ORDER — ONDANSETRON HCL 4 MG/2ML IJ SOLN
INTRAMUSCULAR | Status: AC
  Filled 2021-01-17: qty 2

## 2021-01-17 MED ORDER — KETOROLAC TROMETHAMINE 30 MG/ML IJ SOLN
INTRAMUSCULAR | Status: AC
  Administered 2021-01-17: 30 mg
  Filled 2021-01-17: qty 1

## 2021-01-17 MED ORDER — METHOCARBAMOL 500 MG IVPB - SIMPLE MED
INTRAVENOUS | Status: AC
  Administered 2021-01-17: 500 mg via INTRAVENOUS
  Filled 2021-01-17: qty 50

## 2021-01-17 MED ORDER — LACTATED RINGERS IV BOLUS
500.0000 mL | Freq: Once | INTRAVENOUS | Status: AC
  Administered 2021-01-17: 500 mL via INTRAVENOUS

## 2021-01-17 MED ORDER — MORPHINE SULFATE (PF) 4 MG/ML IV SOLN
INTRAVENOUS | Status: AC
  Filled 2021-01-17: qty 1

## 2021-01-17 MED ORDER — FENTANYL CITRATE (PF) 100 MCG/2ML IJ SOLN
INTRAMUSCULAR | Status: AC
  Filled 2021-01-17: qty 2

## 2021-01-17 MED ORDER — SODIUM CHLORIDE 0.9 % IV SOLN: INTRAVENOUS | Status: DC

## 2021-01-17 MED ORDER — BUPIVACAINE-EPINEPHRINE 0.25% -1:200000 IJ SOLN
INTRAMUSCULAR | Status: DC | PRN
  Administered 2021-01-17: 30 mL

## 2021-01-17 MED ORDER — FENTANYL CITRATE (PF) 100 MCG/2ML IJ SOLN
INTRAMUSCULAR | Status: DC | PRN
  Administered 2021-01-17: 100 ug via INTRAVENOUS
  Administered 2021-01-17 (×2): 50 ug via INTRAVENOUS

## 2021-01-17 MED ORDER — HYDROMORPHONE HCL 1 MG/ML IJ SOLN
1.0000 mg | INTRAMUSCULAR | Status: AC | PRN
Start: 2021-01-17 — End: 2021-01-17
  Administered 2021-01-17: 1 mg via INTRAVENOUS

## 2021-01-17 MED ORDER — TRANEXAMIC ACID-NACL 1000-0.7 MG/100ML-% IV SOLN
INTRAVENOUS | Status: AC
  Administered 2021-01-17: 1000 mg via INTRAVENOUS
  Filled 2021-01-17: qty 100

## 2021-01-17 MED ORDER — CEFAZOLIN SODIUM-DEXTROSE 2-4 GM/100ML-% IV SOLN
2.0000 g | INTRAVENOUS | Status: AC
  Administered 2021-01-17: 2 g via INTRAVENOUS
  Filled 2021-01-17: qty 100

## 2021-01-17 MED ORDER — PHENOL 1.4 % MT LIQD: 1.0000 | OROMUCOSAL | Status: DC | PRN

## 2021-01-17 MED ORDER — KETOROLAC TROMETHAMINE 30 MG/ML IJ SOLN
INTRAMUSCULAR | Status: AC
  Filled 2021-01-17: qty 1

## 2021-01-17 MED ORDER — CLONAZEPAM 0.5 MG PO TABS
0.5000 mg | ORAL_TABLET | Freq: Every day | ORAL | Status: DC
  Administered 2021-01-17: 0.5 mg via ORAL
  Filled 2021-01-17: qty 1

## 2021-01-17 MED ORDER — ORAL CARE MOUTH RINSE: 15.0000 mL | Freq: Once | OROMUCOSAL | Status: AC

## 2021-01-17 MED ORDER — KETOROLAC TROMETHAMINE 15 MG/ML IJ SOLN
7.5000 mg | Freq: Four times a day (QID) | INTRAMUSCULAR | Status: AC
  Administered 2021-01-17 – 2021-01-18 (×4): 7.5 mg via INTRAVENOUS
  Filled 2021-01-17 (×3): qty 1

## 2021-01-17 MED ORDER — ONDANSETRON HCL 4 MG/2ML IJ SOLN
4.0000 mg | Freq: Four times a day (QID) | INTRAMUSCULAR | Status: DC | PRN
  Administered 2021-01-17: 4 mg via INTRAVENOUS

## 2021-01-17 MED ORDER — TRANEXAMIC ACID-NACL 1000-0.7 MG/100ML-% IV SOLN: 1000.0000 mg | Freq: Once | INTRAVENOUS | Status: AC

## 2021-01-17 MED ORDER — CEFAZOLIN SODIUM-DEXTROSE 1-4 GM/50ML-% IV SOLN
INTRAVENOUS | Status: AC
  Administered 2021-01-17: 1000 mg
  Filled 2021-01-17: qty 50

## 2021-01-17 MED ORDER — LORATADINE 10 MG PO TABS
10.0000 mg | ORAL_TABLET | Freq: Every day | ORAL | Status: DC
  Filled 2021-01-17: qty 1

## 2021-01-17 MED ORDER — DEXAMETHASONE SODIUM PHOSPHATE 10 MG/ML IJ SOLN
10.0000 mg | Freq: Once | INTRAMUSCULAR | Status: AC
  Administered 2021-01-18: 10 mg via INTRAVENOUS
  Filled 2021-01-17: qty 1

## 2021-01-17 MED ORDER — EZETIMIBE 10 MG PO TABS
10.0000 mg | ORAL_TABLET | Freq: Every day | ORAL | Status: DC
  Administered 2021-01-18: 10 mg via ORAL
  Filled 2021-01-17 (×2): qty 1

## 2021-01-17 MED ORDER — LIDOCAINE 2% (20 MG/ML) 5 ML SYRINGE
INTRAMUSCULAR | Status: DC | PRN
  Administered 2021-01-17: 60 mg via INTRAVENOUS

## 2021-01-17 MED ORDER — PROPOFOL 10 MG/ML IV BOLUS
INTRAVENOUS | Status: DC | PRN
  Administered 2021-01-17: 140 mg via INTRAVENOUS

## 2021-01-17 MED ORDER — KETOROLAC TROMETHAMINE 30 MG/ML IJ SOLN
INTRAMUSCULAR | Status: DC | PRN
  Administered 2021-01-17: 30 mg

## 2021-01-17 MED ORDER — SENNA 8.6 MG PO TABS: 2.0000 | ORAL_TABLET | Freq: Every day | ORAL | 1 refills | Status: DC

## 2021-01-17 MED ORDER — HYDROMORPHONE HCL 1 MG/ML IJ SOLN
INTRAMUSCULAR | Status: AC
  Filled 2021-01-17: qty 1

## 2021-01-17 MED ORDER — METOCLOPRAMIDE HCL 5 MG PO TABS: 5.0000 mg | ORAL_TABLET | Freq: Three times a day (TID) | ORAL | Status: DC | PRN

## 2021-01-17 MED ORDER — POVIDONE-IODINE 10 % EX SWAB
2.0000 "application " | Freq: Once | CUTANEOUS | Status: AC
  Administered 2021-01-17: 2 via TOPICAL

## 2021-01-17 MED ORDER — MIDAZOLAM HCL 5 MG/5ML IJ SOLN
INTRAMUSCULAR | Status: DC | PRN
  Administered 2021-01-17: 2 mg via INTRAVENOUS

## 2021-01-17 MED ORDER — DOCUSATE SODIUM 100 MG PO CAPS
100.0000 mg | ORAL_CAPSULE | Freq: Two times a day (BID) | ORAL | Status: DC
  Administered 2021-01-17 – 2021-01-18 (×2): 100 mg via ORAL
  Filled 2021-01-17 (×3): qty 1

## 2021-01-17 MED ORDER — ACETAMINOPHEN 325 MG PO TABS: 325.0000 mg | ORAL_TABLET | Freq: Four times a day (QID) | ORAL | Status: DC | PRN

## 2021-01-17 MED ORDER — DOCUSATE SODIUM 100 MG PO CAPS: 100.0000 mg | ORAL_CAPSULE | Freq: Two times a day (BID) | ORAL | 1 refills | Status: AC

## 2021-01-17 MED ORDER — MORPHINE SULFATE (PF) 4 MG/ML IV SOLN
0.5000 mg | INTRAVENOUS | Status: DC | PRN
  Administered 2021-01-17: 1 mg via INTRAVENOUS

## 2021-01-17 MED ORDER — LACTATED RINGERS IV SOLN: INTRAVENOUS | Status: DC

## 2021-01-17 MED ORDER — HYDROCODONE-ACETAMINOPHEN 5-325 MG PO TABS
ORAL_TABLET | ORAL | Status: AC
  Administered 2021-01-17: 1 via ORAL
  Filled 2021-01-17: qty 1

## 2021-01-17 MED ORDER — SODIUM CHLORIDE 0.9 % IR SOLN
Status: DC | PRN
  Administered 2021-01-17: 1000 mL

## 2021-01-17 MED ORDER — PSEUDOEPHEDRINE HCL ER 120 MG PO TB12
120.0000 mg | ORAL_TABLET | Freq: Two times a day (BID) | ORAL | Status: DC
  Administered 2021-01-17: 120 mg via ORAL
  Filled 2021-01-17 (×3): qty 1

## 2021-01-17 MED ORDER — DEXMEDETOMIDINE (PRECEDEX) IN NS 20 MCG/5ML (4 MCG/ML) IV SYRINGE
PREFILLED_SYRINGE | INTRAVENOUS | Status: DC | PRN
  Administered 2021-01-17: 20 ug via INTRAVENOUS

## 2021-01-17 MED ORDER — METHOCARBAMOL 500 MG PO TABS
500.0000 mg | ORAL_TABLET | Freq: Four times a day (QID) | ORAL | Status: DC | PRN
  Administered 2021-01-17: 500 mg via ORAL
  Filled 2021-01-17: qty 1

## 2021-01-17 MED ORDER — TRANEXAMIC ACID-NACL 1000-0.7 MG/100ML-% IV SOLN
1000.0000 mg | INTRAVENOUS | Status: AC
  Administered 2021-01-17: 1000 mg via INTRAVENOUS
  Filled 2021-01-17: qty 100

## 2021-01-17 MED ORDER — ASPIRIN 81 MG PO CHEW
81.0000 mg | CHEWABLE_TABLET | Freq: Two times a day (BID) | ORAL | Status: DC
  Administered 2021-01-17 – 2021-01-18 (×2): 81 mg via ORAL
  Filled 2021-01-17 (×2): qty 1

## 2021-01-17 MED ORDER — AMISULPRIDE (ANTIEMETIC) 5 MG/2ML IV SOLN
INTRAVENOUS | Status: AC
  Filled 2021-01-17: qty 2

## 2021-01-17 MED ORDER — MODAFINIL 200 MG PO TABS: 200.0000 mg | ORAL_TABLET | Freq: Every morning | ORAL | Status: DC

## 2021-01-17 MED ORDER — ALUM & MAG HYDROXIDE-SIMETH 200-200-20 MG/5ML PO SUSP: 30.0000 mL | ORAL | Status: DC | PRN

## 2021-01-17 SURGICAL SUPPLY — 62 items
ADH SKN CLS APL DERMABOND .7 (GAUZE/BANDAGES/DRESSINGS) ×1
APL PRP STRL LF DISP 70% ISPRP (MISCELLANEOUS) ×1
BAG COUNTER SPONGE SURGICOUNT (BAG) IMPLANT
BAG DECANTER FOR FLEXI CONT (MISCELLANEOUS) IMPLANT
BAG SPEC THK2 15X12 ZIP CLS (MISCELLANEOUS)
BAG SPNG CNTER NS LX DISP (BAG)
BAG ZIPLOCK 12X15 (MISCELLANEOUS) IMPLANT
BLADE SURG SZ10 CARB STEEL (BLADE) IMPLANT
CHLORAPREP W/TINT 26 (MISCELLANEOUS) ×2 IMPLANT
COVER PERINEAL POST (MISCELLANEOUS) ×2 IMPLANT
COVER SURGICAL LIGHT HANDLE (MISCELLANEOUS) ×2 IMPLANT
DECANTER SPIKE VIAL GLASS SM (MISCELLANEOUS) ×6 IMPLANT
DERMABOND ADVANCED (GAUZE/BANDAGES/DRESSINGS) ×1
DERMABOND ADVANCED .7 DNX12 (GAUZE/BANDAGES/DRESSINGS) ×1 IMPLANT
DRAPE IMP U-DRAPE 54X76 (DRAPES) ×2 IMPLANT
DRAPE SHEET LG 3/4 BI-LAMINATE (DRAPES) ×6 IMPLANT
DRAPE STERI IOBAN 125X83 (DRAPES) IMPLANT
DRAPE U-SHAPE 47X51 STRL (DRAPES) ×4 IMPLANT
DRSG AQUACEL AG ADV 3.5X10 (GAUZE/BANDAGES/DRESSINGS) ×2 IMPLANT
ELECT REM PT RETURN 15FT ADLT (MISCELLANEOUS) ×2 IMPLANT
GAUZE SPONGE 4X4 12PLY STRL (GAUZE/BANDAGES/DRESSINGS) ×2 IMPLANT
GLOVE SRG 8 PF TXTR STRL LF DI (GLOVE) ×1 IMPLANT
GLOVE SURG ENC MOIS LTX SZ8.5 (GLOVE) ×4 IMPLANT
GLOVE SURG ENC TEXT LTX SZ7.5 (GLOVE) ×4 IMPLANT
GLOVE SURG UNDER POLY LF SZ8 (GLOVE) ×2
GLOVE SURG UNDER POLY LF SZ8.5 (GLOVE) ×2 IMPLANT
GOWN SPEC L3 XXLG W/TWL (GOWN DISPOSABLE) ×2 IMPLANT
GOWN STRL REUS W/TWL XL LVL3 (GOWN DISPOSABLE) ×2 IMPLANT
HANDPIECE INTERPULSE COAX TIP (DISPOSABLE) ×2
HEAD CERAMIC 36 PLUS5 (Hips) ×2 IMPLANT
HOLDER FOLEY CATH W/STRAP (MISCELLANEOUS) IMPLANT
HOOD PEEL AWAY FLYTE STAYCOOL (MISCELLANEOUS) ×8 IMPLANT
JET LAVAGE IRRISEPT WOUND (IRRIGATION / IRRIGATOR)
KIT TURNOVER KIT A (KITS) ×2 IMPLANT
LAVAGE JET IRRISEPT WOUND (IRRIGATION / IRRIGATOR) IMPLANT
LINER NEUTRAL 52X36MM PLUS 4 (Liner) ×2 IMPLANT
MANIFOLD NEPTUNE II (INSTRUMENTS) ×2 IMPLANT
MARKER SKIN DUAL TIP RULER LAB (MISCELLANEOUS) ×2 IMPLANT
NDL SAFETY ECLIPSE 18X1.5 (NEEDLE) ×1 IMPLANT
NEEDLE HYPO 18GX1.5 SHARP (NEEDLE) ×2
NEEDLE SPNL 18GX3.5 QUINCKE PK (NEEDLE) ×2 IMPLANT
PACK ANTERIOR HIP CUSTOM (KITS) ×2 IMPLANT
PENCIL SMOKE EVACUATOR (MISCELLANEOUS) IMPLANT
PIN SECTOR W/GRIP ACE CUP 52MM (Hips) ×2 IMPLANT
SAW OSC TIP CART 19.5X105X1.3 (SAW) ×2 IMPLANT
SEALER BIPOLAR AQUA 6.0 (INSTRUMENTS) ×2 IMPLANT
SET HNDPC FAN SPRY TIP SCT (DISPOSABLE) ×1 IMPLANT
STAPLER VISISTAT 35W (STAPLE) ×2 IMPLANT
STEM TRI LOC BPS SZ4 W GRIPTON (Hips) ×1 IMPLANT
SUT MNCRL AB 3-0 PS2 18 (SUTURE) ×2 IMPLANT
SUT MNCRL AB 4-0 PS2 18 (SUTURE) ×2 IMPLANT
SUT MON AB 2-0 CT1 36 (SUTURE) ×4 IMPLANT
SUT STRATAFIX PDO 1 14 VIOLET (SUTURE) ×2
SUT STRATFX PDO 1 14 VIOLET (SUTURE) ×1
SUT VIC AB 2-0 CT1 27 (SUTURE) ×2
SUT VIC AB 2-0 CT1 TAPERPNT 27 (SUTURE) ×1 IMPLANT
SUTURE STRATFX PDO 1 14 VIOLET (SUTURE) ×1 IMPLANT
SYR 3ML LL SCALE MARK (SYRINGE) ×2 IMPLANT
TRAY FOLEY MTR SLVR 16FR STAT (SET/KITS/TRAYS/PACK) IMPLANT
TRI LOC BPS SZ 4 W GRIPTON (Hips) ×2 IMPLANT
TUBE SUCTION HIGH CAP CLEAR NV (SUCTIONS) ×2 IMPLANT
WATER STERILE IRR 1000ML POUR (IV SOLUTION) ×2 IMPLANT

## 2021-01-17 NOTE — Anesthesia Procedure Notes (Signed)
Procedure Name: Intubation Date/Time: 01/17/2021 8:02 AM Performed by: Abb Gobert D, CRNA Pre-anesthesia Checklist: Patient identified, Emergency Drugs available, Suction available and Patient being monitored Patient Re-evaluated:Patient Re-evaluated prior to induction Oxygen Delivery Method: Circle system utilized Preoxygenation: Pre-oxygenation with 100% oxygen Induction Type: IV induction Ventilation: Mask ventilation without difficulty Laryngoscope Size: Mac and 3 Grade View: Grade I Tube type: Oral Number of attempts: 1 Airway Equipment and Method: Stylet Placement Confirmation: ETT inserted through vocal cords under direct vision, positive ETCO2 and breath sounds checked- equal and bilateral Secured at: 21 cm Tube secured with: Tape Dental Injury: Teeth and Oropharynx as per pre-operative assessment

## 2021-01-17 NOTE — Interval H&P Note (Signed)
History and Physical Interval Note:  01/17/2021 7:43 AM  Dawn [name]  has presented today for surgery, with the diagnosis of right hip osteoarthritis.  The various methods of treatment have been discussed with the patient and family. After consideration of risks, benefits and other options for treatment, the patient has consented to  Procedure(s): TOTAL HIP ARTHROPLASTY ANTERIOR APPROACH (Right) as a surgical intervention.  The patient's history has been reviewed, patient examined, no change in status, stable for surgery.  I have reviewed the patient's chart and labs.  Questions were answered to the patient's satisfaction.     Hilton Cork Soha Thorup

## 2021-01-17 NOTE — Discharge Instructions (Signed)
? ?Dr. Ayrianna [name] ?Joint Replacement Specialist ?[facility] ?[address] ?[place], [place] [address] ?[phone] ? ? ?TOTAL HIP REPLACEMENT POSTOPERATIVE DIRECTIONS ? ? ? ?Hip Rehabilitation, Guidelines Following Surgery  ? ?WEIGHT BEARING ?Weight bearing as tolerated with assist device (walker, cane, etc) as directed, use it as long as suggested by your surgeon or therapist, typically at least 4-6 weeks. ? ?The results of a hip operation are greatly improved after range of motion and muscle strengthening exercises. Follow all safety measures which are given to protect your hip. If any of these exercises cause increased pain or swelling in your joint, decrease the amount until you are comfortable again. Then slowly increase the exercises. Call your caregiver if you have problems or questions.  ? ?HOME CARE INSTRUCTIONS  ?Most of the following instructions are designed to prevent the dislocation of your new hip.  ?Remove items at home which could result in a fall. This includes throw rugs or furniture in walking pathways.  ?Continue medications as instructed at time of discharge. ?You may have some home medications which will be placed on hold until you complete the course of blood thinner medication. ?You may start showering once you are discharged home. Do not remove your dressing. ?Do not put on socks or shoes without following the instructions of your caregivers.   ?Sit on chairs with arms. Use the chair arms to help push yourself up when arising.  ?Arrange for the use of a toilet seat elevator so you are not sitting low.  ?Walk with walker as instructed.  ?You may resume a sexual relationship in one month or when given the OK by your caregiver.  ?Use walker as long as suggested by your caregivers.  ?You may put full weight on your legs and walk as much as is comfortable. ?Avoid periods of inactivity such as sitting longer than an hour when not asleep. This helps prevent blood  clots.  ?You may return to work once you are cleared by your surgeon.  ?Do not drive a car for 6 weeks or until released by your surgeon.  ?Do not drive while taking narcotics.  ?Wear elastic stockings for two weeks following surgery during the day but you may remove then at night.  ?Make sure you keep all of your appointments after your operation with all of your doctors and caregivers. You should call the office at the above phone number and make an appointment for approximately two weeks after the date of your surgery. ?Please pick up a stool softener and laxative for home use as long as you are requiring pain medications. ?ICE to the affected hip every three hours for 30 minutes at a time and then as needed for pain and swelling. Continue to use ice on the hip for pain and swelling from surgery. You may notice swelling that will progress down to the foot and ankle.  This is normal after surgery.  Elevate the leg when you are not up walking on it.   ?It is important for you to complete the blood thinner medication as prescribed by your doctor. ?Continue to use the breathing machine which will help keep your temperature down.  It is common for your temperature to cycle up and down following surgery, especially at night when you are not up moving around and exerting yourself.  The breathing machine keeps your lungs expanded and your temperature down. ? ?RANGE OF MOTION AND STRENGTHENING EXERCISES  ?These exercises are designed to help you   keep full movement of your hip joint. Follow your caregiver's or physical therapist's instructions. Perform all exercises about fifteen times, three times per day or as directed. Exercise both hips, even if you have had only one joint replacement. These exercises can be done on a training (exercise) mat, on the floor, on a table or on a bed. Use whatever works the best and is most comfortable for you. Use music or television while you are exercising so that the exercises are a  pleasant break in your day. This will make your life better with the exercises acting as a break in routine you can look forward to.  ?Lying on your back, slowly slide your foot toward your buttocks, raising your knee up off the floor. Then slowly slide your foot back down until your leg is straight again.  ?Lying on your back spread your legs as far apart as you can without causing discomfort.  ?Lying on your side, raise your upper leg and foot straight up from the floor as far as is comfortable. Slowly lower the leg and repeat.  ?Lying on your back, tighten up the muscle in the front of your thigh (quadriceps muscles). You can do this by keeping your leg straight and trying to raise your heel off the floor. This helps strengthen the largest muscle supporting your knee.  ?Lying on your back, tighten up the muscles of your buttocks both with the legs straight and with the knee bent at a comfortable angle while keeping your heel on the floor.  ? ?SKILLED REHAB INSTRUCTIONS: ?If the patient is transferred to a skilled rehab facility following release from the hospital, a list of the current medications will be sent to the facility for the patient to continue.  When discharged from the skilled rehab facility, please have the facility set up the patient's Home Health Physical Therapy prior to being released. Also, the skilled facility will be responsible for providing the patient with their medications at time of release from the facility to include their pain medication and their blood thinner medication. If the patient is still at the rehab facility at time of the two week follow up appointment, the skilled rehab facility will also need to assist the patient in arranging follow up appointment in our office and any transportation needs. ? ?POST-OPERATIVE OPIOID TAPER INSTRUCTIONS: ?It is important to wean off of your opioid medication as soon as possible. If you do not need pain medication after your surgery it is ok  to stop day one. ?Opioids include: ?Codeine, Hydrocodone(Norco, Vicodin), Oxycodone(Percocet, oxycontin) and hydromorphone amongst others.  ?Long term and even short term use of opiods can cause: ?Increased pain response ?Dependence ?Constipation ?Depression ?Respiratory depression ?And more.  ?Withdrawal symptoms can include ?Flu like symptoms ?Nausea, vomiting ?And more ?Techniques to manage these symptoms ?Hydrate well ?Eat regular healthy meals ?Stay active ?Use relaxation techniques(deep breathing, meditating, yoga) ?Do Not substitute Alcohol to help with tapering ?If you have been on opioids for less than two weeks and do not have pain than it is ok to stop all together.  ?Plan to wean off of opioids ?This plan should start within one week post op of your joint replacement. ?Maintain the same interval or time between taking each dose and first decrease the dose.  ?Cut the total daily intake of opioids by one tablet each day ?Next start to increase the time between doses. ?The last dose that should be eliminated is the evening dose.  ? ? ?MAKE   SURE YOU:  ?Understand these instructions.  ?Will watch your condition.  ?Will get help right away if you are not doing well or get worse. ? ?Pick up stool softner and laxative for home use following surgery while on pain medications. ?Do not remove your dressing. ?The dressing is waterproof--it is OK to take showers. ?Continue to use ice for pain and swelling after surgery. ?Do not use any lotions or creams on the incision until instructed by your surgeon. ?Total Hip Protocol. ? ?

## 2021-01-17 NOTE — Plan of Care (Signed)
  Problem: Education: Goal: Knowledge of the prescribed therapeutic regimen will improve Outcome: Progressing   Problem: Activity: Goal: Ability to avoid complications of mobility impairment will improve Outcome: Progressing   Problem: Pain Management: Goal: Pain level will decrease with appropriate interventions Outcome: Progressing   

## 2021-01-17 NOTE — Transfer of Care (Signed)
Immediate Anesthesia Transfer of Care Note  Patient: Dawn [name]  Procedure(s) Performed: TOTAL HIP ARTHROPLASTY ANTERIOR APPROACH (Right: Hip)  Patient Location: PACU  Anesthesia Type:General  Level of Consciousness: awake, alert  and oriented  Airway & Oxygen Therapy: Patient Spontanous Breathing and Patient connected to face mask oxygen  Post-op Assessment: Report given to RN and Post -op Vital signs reviewed and stable  Post vital signs: Reviewed and stable  Last Vitals:  Vitals Value Taken Time  BP 184/71 01/17/21 1000  Temp    Pulse 58 01/17/21 1001  Resp 14 01/17/21 1001  SpO2 100 % 01/17/21 1001  Vitals shown include unvalidated device data.  Last Pain:  Vitals:   01/17/21 0608  TempSrc:   PainSc: 0-No pain      Patients Stated Pain Goal: 3 (15/52/08 0223)  Complications: No notable events documented.

## 2021-01-17 NOTE — Progress Notes (Signed)
PT Cancellation Note  Patient Details Name: Dawn [name] MRN: [id number] DOB: 1946/05/13   Cancelled Treatment:    Reason Eval/Treat Not Completed: Other (comment) Reports chronic vertigo and is very dizzy now. Patient has ambulated to [facility] with Staff. Will follow up in AM.    Claretha Cooper 01/17/2021, 5:11 PM  [place] Pager [phone] Office [phone]

## 2021-01-17 NOTE — Anesthesia Postprocedure Evaluation (Signed)
Anesthesia Post Note  Patient: Dawn [name]  Procedure(s) Performed: TOTAL HIP ARTHROPLASTY ANTERIOR APPROACH (Right: Hip)     Patient location during evaluation: PACU Anesthesia Type: General Level of consciousness: awake and alert Pain management: pain level controlled Vital Signs Assessment: post-procedure vital signs reviewed and stable Respiratory status: spontaneous breathing, nonlabored ventilation, respiratory function stable and patient connected to nasal cannula oxygen Cardiovascular status: blood pressure returned to baseline and stable Postop Assessment: no apparent nausea or vomiting Anesthetic complications: no   No notable events documented.  Last Vitals:  Vitals:   01/17/21 1330 01/17/21 1352  BP: (!) 139/52 (!) 129/51  Pulse: (!) 58 (!) 59  Resp: 16 12  Temp:  36.7 C  SpO2: 100% 99%    Last Pain:  Vitals:   01/17/21 1352  TempSrc: Oral  PainSc:                  Yuya Vanwingerden,W. EDMOND

## 2021-01-17 NOTE — Op Note (Signed)
OPERATIVE REPORT  SURGEON: Rod Can, MD   ASSISTANT: Cherlynn June, PA-C.  PREOPERATIVE DIAGNOSIS: Right hip arthritis.   POSTOPERATIVE DIAGNOSIS: Right hip arthritis.   PROCEDURE: Right total hip arthroplasty, anterior approach.   IMPLANTS: DePuy Tri Lock stem, size 4, hi offset. DePuy Pinnacle Cup, size 52 mm. DePuy Altrx liner, size 32 by 52 mm, +4 neutral. DePuy Biolox ceramic head ball, size 36 + 5 mm.  ANESTHESIA:  General  ESTIMATED BLOOD LOSS:-250 mL    ANTIBIOTICS: 2g Ancef.  DRAINS: None.  COMPLICATIONS: None.   CONDITION: PACU - hemodynamically stable.   BRIEF CLINICAL NOTE: Dawn [name] is a 75 y.o. female with a long-standing history of Right hip arthritis. After failing conservative management, the patient was indicated for total hip arthroplasty. The risks, benefits, and alternatives to the procedure were explained, and the patient elected to proceed.  PROCEDURE IN DETAIL: Surgical site was marked by myself in the pre-op holding area. Once inside the operating room, general anesthesia was obtained, and a foley catheter was inserted. The patient was then positioned on the Hana table.  All bony prominences were well padded.  The hip was prepped and draped in the normal sterile surgical fashion.  A time-out was called verifying side and site of surgery. The patient received IV antibiotics within 60 minutes of beginning the procedure.   Bikini incision was created three finger breadths distal to the ASIS, taking care to stay lateral to the medial border of the ASIS. The direct anterior approach to the hip was performed through the Hueter interval.  Lateral femoral circumflex vessels were treated with the Auqumantys. The anterior capsule was exposed and an inverted T capsulotomy was made. The femoral neck cut was made to the level of the templated cut.  A corkscrew was placed into the head and the head was removed.  The femoral head was found to have eburnated  bone. The head was passed to the back table and was measured. Pubofemoral ligament was released off of the calcar, taking care to stay on bone. Superior capsule was released from the greater trochanter, taking care to stay lateral to the posterior border of the femoral neck in order to preserve the short external rotators.   Acetabular exposure was achieved, and the pulvinar and labrum were excised. Sequential reaming of the acetabulum was then performed up to a size 51 mm reamer under direct visulization. A 52 mm cup was then opened and impacted into place at approximately 40 degrees of abduction and 20 degrees of anteversion. The final polyethylene liner was impacted into place and acetabular osteophytes were removed.    I then gained femoral exposure taking care to protect the abductors and greater trochanter.  This was performed using standard external rotation, extension, and adduction.  A cookie cutter was used to enter the femoral canal, and then the femoral canal finder was placed.  Sequential broaching was performed up to a size 4.  Calcar planer was used on the femoral neck remnant.  I placed a hi offset neck and a trial head ball.  The hip was reduced.  Leg lengths and offset were checked fluoroscopically.  The hip was dislocated and trial components were removed.  The final implants were placed, and the hip was reduced.  Fluoroscopy was used to confirm component position and leg lengths.  At 90 degrees of external rotation and full extension, the hip was stable to an anterior directed force.   The wound was copiously irrigated with Irrisept  solution and normal saline using pule lavage.  Marcaine solution was injected into the periarticular soft tissue.  The wound was closed in layers using #1 Stratafix for the fascia, 2-0 Vicryl for the subcutaneous fat, 2-0 Monocryl for the deep dermal layer, 3-0 running Monocryl subcuticular stitch, and Dermabond for the skin.  Once the glue was fully dried, an  Aquacell Ag dressing was applied.  The patient was transported to the recovery room in stable condition.  Sponge, needle, and instrument counts were correct at the end of the case x2.  The patient tolerated the procedure well and there were no known complications.  Please note that a surgical assistant was a medical necessity for this procedure to perform it in a safe and expeditious manner. Assistant was necessary to provide appropriate retraction of vital neurovascular structures, to prevent femoral fracture, and to allow for anatomic placement of the prosthesis.

## 2021-01-17 NOTE — Progress Notes (Signed)
Pt in PACU has been given multiple pain medications with little improvement in relief. I gave her 67mcg of Precedex IV, which seems to have taken the edge off somewhat. Will continue to monitor in PACU for now.

## 2021-01-18 ENCOUNTER — Encounter (HOSPITAL_COMMUNITY): Payer: Self-pay | Admitting: Orthopedic Surgery

## 2021-01-18 DIAGNOSIS — I10 Essential (primary) hypertension: Secondary | ICD-10-CM | POA: Diagnosis not present

## 2021-01-18 DIAGNOSIS — Z8673 Personal history of transient ischemic attack (TIA), and cerebral infarction without residual deficits: Secondary | ICD-10-CM | POA: Diagnosis not present

## 2021-01-18 DIAGNOSIS — Z79899 Other long term (current) drug therapy: Secondary | ICD-10-CM | POA: Diagnosis not present

## 2021-01-18 DIAGNOSIS — Z7982 Long term (current) use of aspirin: Secondary | ICD-10-CM | POA: Diagnosis not present

## 2021-01-18 DIAGNOSIS — M1611 Unilateral primary osteoarthritis, right hip: Secondary | ICD-10-CM | POA: Diagnosis not present

## 2021-01-18 DIAGNOSIS — Z87891 Personal history of nicotine dependence: Secondary | ICD-10-CM | POA: Diagnosis not present

## 2021-01-18 LAB — BASIC METABOLIC PANEL
Anion gap: 5 (ref 5–15)
BUN: 16 mg/dL (ref 8–23)
CO2: 27 mmol/L (ref 22–32)
Calcium: 8.9 mg/dL (ref 8.9–10.3)
Chloride: 108 mmol/L (ref 98–111)
Creatinine, Ser: 0.6 mg/dL (ref 0.44–1.00)
GFR, Estimated: >60 mL/min (ref >60)
Glucose, Bld: 121 mg/dL — ABNORMAL HIGH (ref 70–99)
Potassium: 4.3 mmol/L (ref 3.5–5.1)
Sodium: 140 mmol/L (ref 135–145)

## 2021-01-18 LAB — CBC
HCT: 35.4 % — ABNORMAL LOW (ref 36.0–46.0)
Hemoglobin: 11.1 g/dL — ABNORMAL LOW (ref 12.0–15.0)
MCH: 28.8 pg (ref 26.0–34.0)
MCHC: 31.4 g/dL (ref 30.0–36.0)
MCV: 91.9 fL (ref 80.0–100.0)
Platelets: 194 10*3/uL (ref 150–400)
RBC: 3.85 MIL/uL — ABNORMAL LOW (ref 3.87–5.11)
RDW: 14.6 % (ref 11.5–15.5)
WBC: 11.4 10*3/uL — ABNORMAL HIGH (ref 4.0–10.5)
nRBC: 0 % (ref 0.0–0.2)

## 2021-01-18 NOTE — Evaluation (Signed)
Physical Therapy Evaluation Patient Details Name: Dawn [name] MRN: [id number] DOB: [birth year] Today's Date: 01/18/2021   History of Present Illness  75 YO s/p R DATHA, H/O MS, vertigo, peripheral neuropathy  Clinical Impression  The patient is mobilizing very well. Patient ambulated x 200'. Patient plans to DC today.    Follow Up Recommendations Follow surgeon's recommendation for DC plan and follow-up therapies    Equipment Recommendations  Rolling walker with 5" wheels    Recommendations for Other Services       Precautions / Restrictions Precautions Precautions: Fall      Mobility  Bed Mobility Overal bed mobility: Modified Independent                  Transfers Overall transfer level: Needs assistance Equipment used: Rolling walker (2 wheeled) Transfers: Sit to/from Stand Sit to Stand: Min guard            Ambulation/Gait Ambulation/Gait assistance: Min guard Gait Distance (Feet): 200 Feet Assistive device: Rolling walker (2 wheeled) Gait Pattern/deviations: Step-through pattern     General Gait Details: smoothe and safe gait  Stairs            Wheelchair Mobility    Modified Rankin (Stroke Patients Only)       Balance Overall balance assessment: Mild deficits observed, not formally tested                                           Pertinent Vitals/Pain Pain Assessment: No/denies pain    Home Living Family/patient expects to be discharged to:: Private residence Living Arrangements: Alone Available Help at Discharge: Family;Available 24 hours/day Type of Home: House Home Access: Stairs to enter Entrance Stairs-Rails: Psychiatric nurse of Steps: 2 Home Layout: One level Home Equipment: Walker - 4 wheels      Prior Function Level of Independence: Independent               Hand Dominance   Dominant Hand: Right    Extremity/Trunk Assessment   Upper Extremity Assessment Upper  Extremity Assessment: Overall WFL for tasks assessed    Lower Extremity Assessment Lower Extremity Assessment:  (some mild weakness of dorsiflexion.)    Cervical / Trunk Assessment Cervical / Trunk Assessment: Normal  Communication   Communication: No difficulties  Cognition Arousal/Alertness: Awake/alert Behavior During Therapy: WFL for tasks assessed/performed Overall Cognitive Status: Within Functional Limits for tasks assessed                                        General Comments      Exercises Total Joint Exercises Ankle Circles/Pumps: AROM;Both;10 reps Quad Sets: AROM;Both;10 reps Short Arc Quad: AROM;Right;10 reps Heel Slides: AROM;Right;10 reps Hip ABduction/ADduction: AROM;Right;10 reps   Assessment/Plan    PT Assessment All further PT needs can be met in the next venue of care  PT Problem List Decreased strength;Decreased mobility;Decreased safety awareness;Decreased activity tolerance;Decreased knowledge of precautions;Impaired sensation       PT Treatment Interventions DME instruction;Therapeutic activities;Gait training;Therapeutic exercise;Patient/family education;Functional mobility training    PT Goals (Current goals can be found in the Care Plan section)  Acute Rehab PT Goals Patient Stated Goal: go home PT Goal Formulation: All assessment and education complete, DC therapy    Frequency  Barriers to discharge        Co-evaluation               AM-PAC PT "6 Clicks" Mobility  Outcome Measure Help needed turning from your back to your side while in a flat bed without using bedrails?: None Help needed moving from lying on your back to sitting on the side of a flat bed without using bedrails?: None Help needed moving to and from a bed to a chair (including a wheelchair)?: A Little Help needed standing up from a chair using your arms (e.g., wheelchair or bedside chair)?: A Little Help needed to walk in hospital room?: A  Little Help needed climbing 3-5 steps with a railing? : A Little 6 Click Score: 20    End of Session Equipment Utilized During Treatment: Gait belt Activity Tolerance: Patient tolerated treatment well Patient left: in chair;with call bell/phone within reach;with chair alarm set Nurse Communication: Mobility status PT Visit Diagnosis: Unsteadiness on feet (R26.81);Difficulty in walking, not elsewhere classified (R26.2)    Time: 4715-9539 PT Time Calculation (min) (ACUTE ONLY): 25 min   Charges:   PT Evaluation $PT Eval Low Complexity: 1 Low PT Treatments $Gait Training: 8-22 mins        Tresa Endo PT Acute Rehabilitation Services Pager [phone] Office [phone]   Claretha Cooper 01/18/2021, 12:24 PM

## 2021-01-18 NOTE — Care Management CC44 (Signed)
Condition Code 44 Documentation Completed  Patient Details  Name: Dawn [name] MRN: [id number] Date of Birth: 02/10/1946   Condition Code 44 given:  Yes Patient signature on Condition Code 44 notice:  Yes Documentation of 2 MD's agreement:  Yes Code 44 added to claim:  Yes    Nero Sawatzky, [place] 01/18/2021, 9:54 AM

## 2021-01-18 NOTE — TOC Transition Note (Signed)
Transition of Care (TOC) - CM/SW Discharge Note   Patient Details  Name: Dawn [name] MRN: [id number] Date of Birth: 04/03/1946  Transition of Care (TOC) CM/SW Contact:  HOYLE, LUCY, LCSW Phone Number: 01/18/2021, 11:38 AM   Clinical Narrative:    Met briefly with pt who confirms needs rw and 3n1 - delivered by [employer] this morning.  Plan HEP. No further TOC needs.   Final next level of care: Home/Self Care Barriers to Discharge: No Barriers Identified   Patient Goals and CMS Choice Patient states their goals for this hospitalization and ongoing recovery are:: return home      Discharge Placement                       Discharge Plan and Services                DME Arranged: 3-N-1, Walker rolling DME Agency: [employer] Date DME Agency Contacted:  (ordered via MD office prior to surgery)                Social Determinants of Health (SDOH) Interventions     Readmission Risk Interventions No flowsheet data found.     

## 2021-01-18 NOTE — Progress Notes (Signed)
    Subjective:  Patient reports pain as mild.  Denies N/V/CP/SOB.   Objective:   VITALS:   Vitals:   01/17/21 2133 01/18/21 0114 01/18/21 0114 01/18/21 0525  BP: (!) 138/57 (!) 138/54 (!) 138/54 (!) 147/63  Pulse: 64 68 64 72  Resp: 16  16 16   Temp: 98.5 F (36.9 C)  98.6 F (37 C) 98.4 F (36.9 C)  TempSrc: Oral  Oral Oral  SpO2: 99% 97% 96% 97%  Weight:      Height:        NAD ABD soft Neurovascular intact Sensation intact distally Intact pulses distally Dorsiflexion/Plantar flexion intact Incision: dressing C/D/I   Lab Results  Component Value Date   WBC 11.4 (H) 01/18/2021   HGB 11.1 (L) 01/18/2021   HCT 35.4 (L) 01/18/2021   MCV 91.9 01/18/2021   PLT 194 01/18/2021   BMET    Component Value Date/Time   NA 140 01/18/2021 0243   NA 139 04/04/2020 1132   K 4.3 01/18/2021 0243   CL 108 01/18/2021 0243   CO2 27 01/18/2021 0243   GLUCOSE 121 (H) 01/18/2021 0243   BUN 16 01/18/2021 0243   BUN 24 04/04/2020 1132   CREATININE 0.60 01/18/2021 0243   CREATININE 0.77 11/29/2020 1341   CALCIUM 8.9 01/18/2021 0243   GFRNONAA >60 01/18/2021 0243   GFRNONAA 88 11/19/2020 0945   GFRAA 102 11/19/2020 0945     Assessment/Plan: 1 Day Post-Op   Principal Problem:   Osteoarthritis of right hip Active Problems:   Status post THR (total hip replacement)   WBAT with walker DVT ppx: Aspirin, SCDs, TEDS PO pain control PT/OT Dispo: D/C home once cleared by therapy     Dawn [name] 01/18/2021, 8:49 AM  [facility] is now [facility] [address], [place], [place] [address] Phone: [phone] [email or web address] Facebook  [employer]

## 2021-01-18 NOTE — Care Management Obs Status (Signed)
[place] NOTIFICATION   Patient Details  Name: Dawn [name] MRN: [id number] Date of Birth: July 16, 1946   Medicare Observation Status Notification Given:  Macario Golds, [place] 01/18/2021, 9:54 AM

## 2021-01-22 NOTE — Discharge Summary (Signed)
Physician Discharge Summary  Patient ID: Dawn [name] MRN: [id number] DOB/AGE: Dec 13, 1945 75 y.o.  Admit date: 01/17/2021 Discharge date: 01/18/2021  Admission Diagnoses:  Osteoarthritis of right hip  Discharge Diagnoses:  Principal Problem:   Osteoarthritis of right hip Active Problems:   Status post THR (total hip replacement)   Past Medical History:  Diagnosis Date   Allergy    Dry eyes    Endometrial polyp    Essential hypertension 08/25/2018   GERD (gastroesophageal reflux disease)    History of hiatal hernia    History of kidney stones    Hot flashes, menopausal 10/13/2011   Estradiol started    Jaundice as teenager   no problems since   Memory loss 09/21/2019     1/2 of feet numb all the time   Multiple sclerosis ([place]) dx 2001   Neuropathy    Neuropathy    bilateral feet   Osteoarthritis    Sjogren's syndrome ([place]) dx oct 2021   sore muscvles, dry mouth and eyes   Stroke ([place])    Vertigo    Vision abnormalities     Surgeries: Procedure(s): TOTAL HIP ARTHROPLASTY ANTERIOR APPROACH on 01/17/2021   Consultants (if any):   Discharged Condition: Improved  Hospital Course: Dawn [name] is an 76 y.o. female who was admitted 01/17/2021 with a diagnosis of Osteoarthritis of right hip and went to the operating room on 01/17/2021 and underwent the above named procedures.    She was given perioperative antibiotics:  Anti-infectives (From admission, onward)    Start     Dose/Rate Route Frequency Ordered Stop   01/17/21 1400  ceFAZolin (ANCEF) IVPB 2g/100 mL premix        2 g 200 mL/hr over 30 Minutes Intravenous Every 6 hours 01/17/21 0952 01/17/21 2139   01/17/21 1006  ceFAZolin (ANCEF) 1-4 GM/50ML-% IVPB       Note to Pharmacy: Dawn [name]   : cabinet override      01/17/21 1006 01/17/21 1419   01/17/21 0600  ceFAZolin (ANCEF) IVPB 2g/100 mL premix        2 g 200 mL/hr over 30 Minutes Intravenous On call to O.R. 01/17/21 9381 01/17/21 0803      .  She was given sequential compression devices, early ambulation, and aspirin for DVT prophylaxis.  She benefited maximally from the hospital stay and there were no complications.    Recent vital signs:  Vitals:   01/18/21 0114 01/18/21 0525  BP: (!) 138/54 (!) 147/63  Pulse: 64 72  Resp: 16 16  Temp: 98.6 F (37 C) 98.4 F (36.9 C)  SpO2: 96% 97%    Recent laboratory studies:  Lab Results  Component Value Date   HGB 11.1 (L) 01/18/2021   HGB 13.6 01/07/2021   HGB 14.0 11/19/2020   Lab Results  Component Value Date   WBC 11.4 (H) 01/18/2021   PLT 194 01/18/2021   Lab Results  Component Value Date   INR 1.0 01/07/2021   Lab Results  Component Value Date   NA 140 01/18/2021   K 4.3 01/18/2021   CL 108 01/18/2021   CO2 27 01/18/2021   BUN 16 01/18/2021   CREATININE 0.60 01/18/2021   GLUCOSE 121 (H) 01/18/2021     WEIGHT BEARING   Weight bearing as tolerated with assist device (walker, cane, etc) as directed, use it as long as suggested by your surgeon or therapist, typically at least 4-6 weeks.   EXERCISES  Results after joint  replacement surgery are often greatly improved when you follow the exercise, range of motion and muscle strengthening exercises prescribed by your doctor. Safety measures are also important to protect the joint from further injury. Any time any of these exercises cause you to have increased pain or swelling, decrease what you are doing until you are comfortable again and then slowly increase them. If you have problems or questions, call your caregiver or physical therapist for advice.   Rehabilitation is important following a joint replacement. After just a few days of immobilization, the muscles of the leg can become weakened and shrink (atrophy).  These exercises are designed to build up the tone and strength of the thigh and leg muscles and to improve motion. Often times heat used for twenty to thirty minutes before working out will  loosen up your tissues and help with improving the range of motion but do not use heat for the first two weeks following surgery (sometimes heat can increase post-operative swelling).   These exercises can be done on a training (exercise) mat, on the floor, on a table or on a bed. Use whatever works the best and is most comfortable for you.    Use music or television while you are exercising so that the exercises are a pleasant break in your day. This will make your life better with the exercises acting as a break in your routine that you can look forward to.   Perform all exercises about fifteen times, three times per day or as directed.  You should exercise both the operative leg and the other leg as well.  Exercises include:   Quad Sets - Tighten up the muscle on the front of the thigh (Quad) and hold for 5-10 seconds.   Straight Leg Raises - With your knee straight (if you were given a brace, keep it on), lift the leg to 60 degrees, hold for 3 seconds, and slowly lower the leg.  Perform this exercise against resistance later as your leg gets stronger.  Leg Slides: Lying on your back, slowly slide your foot toward your buttocks, bending your knee up off the floor (only go as far as is comfortable). Then slowly slide your foot back down until your leg is flat on the floor again.  Angel Wings: Lying on your back spread your legs to the side as far apart as you can without causing discomfort.  Hamstring Strength:  Lying on your back, push your heel against the floor with your leg straight by tightening up the muscles of your buttocks.  Repeat, but this time bend your knee to a comfortable angle, and push your heel against the floor.  You may put a pillow under the heel to make it more comfortable if necessary.   A rehabilitation program following joint replacement surgery can speed recovery and prevent re-injury in the future due to weakened muscles. Contact your doctor or a physical therapist for more  information on knee rehabilitation.    CONSTIPATION  Constipation is defined medically as fewer than three stools per week and severe constipation as less than one stool per week.  Even if you have a regular bowel pattern at home, your normal regimen is likely to be disrupted due to multiple reasons following surgery.  Combination of anesthesia, postoperative narcotics, change in appetite and fluid intake all can affect your bowels.   YOU MUST use at least one of the following options; they are listed in order of increasing strength to get the  job done.  They are all available over the counter, and you may need to use some, POSSIBLY even all of these options:    Drink plenty of fluids (prune juice may be helpful) and high fiber foods Colace 100 mg by mouth twice a day  Senokot for constipation as directed and as needed Dulcolax (bisacodyl), take with full glass of water  Miralax (polyethylene glycol) once or twice a day as needed.  If you have tried all these things and are unable to have a bowel movement in the first 3-4 days after surgery call either your surgeon or your primary doctor.    If you experience loose stools or diarrhea, hold the medications until you stool forms back up.  If your symptoms do not get better within 1 week or if they get worse, check with your doctor.  If you experience "the worst abdominal pain ever" or develop nausea or vomiting, please contact the office immediately for further recommendations for treatment.   ITCHING:  If you experience itching with your medications, try taking only a single pain pill, or even half a pain pill at a time.  You can also use Benadryl over the counter for itching or also to help with sleep.   TED HOSE STOCKINGS:  Use stockings on both legs until for at least 2 weeks or as directed by physician office. They may be removed at night for sleeping.  MEDICATIONS:  See your medication summary on the "After Visit Summary" that nursing will  review with you.  You may have some home medications which will be placed on hold until you complete the course of blood thinner medication.  It is important for you to complete the blood thinner medication as prescribed.  PRECAUTIONS:  If you experience chest pain or shortness of breath - call 911 immediately for transfer to the hospital emergency department.   If you develop a fever greater that 101 F, purulent drainage from wound, increased redness or drainage from wound, foul odor from the wound/dressing, or calf pain - CONTACT YOUR SURGEON.                                                   FOLLOW-UP APPOINTMENTS:  If you do not already have a post-op appointment, please call the office for an appointment to be seen by your surgeon.  Guidelines for how soon to be seen are listed in your "After Visit Summary", but are typically between 1-4 weeks after surgery.  Dental Antibiotics:  In most cases prophylactic antibiotics for Dental procdeures after total joint surgery are not necessary.  Exceptions are as follows:  1. History of prior total joint infection  2. Severely immunocompromised (Organ Transplant, cancer chemotherapy, Rheumatoid biologic meds such as [place])  3. Poorly controlled diabetes (A1C &gt; 8.0, blood glucose over 200)  If you have one of these conditions, contact your surgeon for an antibiotic prescription, prior to your dental procedure.  OTHER INSTRUCTIONS:   Knee Replacement:  Do not place pillow under knee, focus on keeping the knee straight while resting. CPM instructions: 0-90 degrees, 2 hours in the morning, 2 hours in the afternoon, and 2 hours in the evening. Place foam block, curve side up under heel at all times except when in CPM or when walking.  DO NOT modify, tear, cut, or change the  foam block in any way.   MAKE SURE YOU:  Understand these instructions.  Get help right away if you are not doing well or get worse.    Thank you for letting us be a  part of your medical care team.  It is a privilege we respect greatly.  We hope these instructions will help you stay on track for a fast and full recovery!   Diagnostic Studies: DG Pelvis Portable  Result Date: 01/17/2021 CLINICAL DATA:  Hip arthroplasty EXAM: PORTABLE PELVIS 1-2 VIEWS COMPARISON:  Same day radiograph FINDINGS: There is a right hip arthroplasty in normal alignment without evidence of loosening or fracture. Expected soft tissue changes. IMPRESSION: No evidence of right hip arthroplasty complication on single frontal view. Electronically Signed   By: Maurine Simmering   On: 01/17/2021 11:27   DG C-Arm 1-60 Min-No Report  Result Date: 01/17/2021 CLINICAL DATA:  Status post total hip replacement EXAM: OPERATIVE RIGHT HIP  1 VIEW TECHNIQUE: Fluoroscopic spot image(s) were submitted for interpretation post-operatively. COMPARISON:  MR arthrography right hip October 31, 2020 FLUOROSCOPY TIME:  0 minutes 12 seconds; 4 acquired images FINDINGS: Frontal views demonstrate total hip replacement on the right with prosthetic components well-seated on frontal view. No fracture or dislocation. Left hip joint appears unremarkable. IMPRESSION: Total hip replacement on the right with prosthetic components well-seated on frontal view. No evident fracture or dislocation. Electronically Signed   By: Lowella Grip III M.D.   On: 01/17/2021 09:56   DG HIP OPERATIVE UNILAT W OR W/O PELVIS RIGHT  Result Date: 01/17/2021 CLINICAL DATA:  Status post total hip replacement EXAM: OPERATIVE RIGHT HIP  1 VIEW TECHNIQUE: Fluoroscopic spot image(s) were submitted for interpretation post-operatively. COMPARISON:  MR arthrography right hip October 31, 2020 FLUOROSCOPY TIME:  0 minutes 12 seconds; 4 acquired images FINDINGS: Frontal views demonstrate total hip replacement on the right with prosthetic components well-seated on frontal view. No fracture or dislocation. Left hip joint appears unremarkable. IMPRESSION: Total hip  replacement on the right with prosthetic components well-seated on frontal view. No evident fracture or dislocation. Electronically Signed   By: Lowella Grip III M.D.   On: 01/17/2021 09:56    Disposition: Discharge disposition: 01-Home or Self Care       Discharge Instructions     Call MD / Call 911   Complete by: As directed    If you experience chest pain or shortness of breath, CALL 911 and be transported to the hospital emergency room.  If you develope a fever above 101 F, pus (white drainage) or increased drainage or redness at the wound, or calf pain, call your surgeon's office.   Call MD / Call 911   Complete by: As directed    If you experience chest pain or shortness of breath, CALL 911 and be transported to the hospital emergency room.  If you develope a fever above 101 F, pus (white drainage) or increased drainage or redness at the wound, or calf pain, call your surgeon's office.   Call MD / Call 911   Complete by: As directed    If you experience chest pain or shortness of breath, CALL 911 and be transported to the hospital emergency room.  If you develope a fever above 101 F, pus (white drainage) or increased drainage or redness at the wound, or calf pain, call your surgeon's office.   Constipation Prevention   Complete by: As directed    Drink plenty of fluids.  Prune juice  may be helpful.  You may use a stool softener, such as Colace (over the counter) 100 mg twice a day.  Use MiraLax (over the counter) for constipation as needed.   Constipation Prevention   Complete by: As directed    Drink plenty of fluids.  Prune juice may be helpful.  You may use a stool softener, such as Colace (over the counter) 100 mg twice a day.  Use MiraLax (over the counter) for constipation as needed.   Constipation Prevention   Complete by: As directed    Drink plenty of fluids.  Prune juice may be helpful.  You may use a stool softener, such as Colace (over the counter) 100 mg twice a  day.  Use MiraLax (over the counter) for constipation as needed.   Diet - low sodium heart healthy   Complete by: As directed    Driving restrictions   Complete by: As directed    No driving for 6 weeks   Driving restrictions   Complete by: As directed    No driving for 6 weeks   Driving restrictions   Complete by: As directed    No driving for 6 weeks   Follow the hip precautions as taught in Physical Therapy   Complete by: As directed    Follow the hip precautions as taught in Physical Therapy   Complete by: As directed    Increase activity slowly as tolerated   Complete by: As directed    Increase activity slowly as tolerated   Complete by: As directed    Increase activity slowly as tolerated   Complete by: As directed    Lifting restrictions   Complete by: As directed    No lifting for 6 weeks   Lifting restrictions   Complete by: As directed    No lifting for 6 weeks   Lifting restrictions   Complete by: As directed    No lifting for 6 weeks   Post-operative opioid taper instructions:   Complete by: As directed    POST-OPERATIVE OPIOID TAPER INSTRUCTIONS: It is important to wean off of your opioid medication as soon as possible. If you do not need pain medication after your surgery it is ok to stop day one. Opioids include: Codeine, Hydrocodone(Norco, Vicodin), Oxycodone(Percocet, oxycontin) and hydromorphone amongst others.  Long term and even short term use of opiods can cause: Increased pain response Dependence Constipation Depression Respiratory depression And more.  Withdrawal symptoms can include Flu like symptoms Nausea, vomiting And more Techniques to manage these symptoms Hydrate well Eat regular healthy meals Stay active Use relaxation techniques(deep breathing, meditating, yoga) Do Not substitute Alcohol to help with tapering If you have been on opioids for less than two weeks and do not have pain than it is ok to stop all together.  Plan to wean  off of opioids This plan should start within one week post op of your joint replacement. Maintain the same interval or time between taking each dose and first decrease the dose.  Cut the total daily intake of opioids by one tablet each day Next start to increase the time between doses. The last dose that should be eliminated is the evening dose.      Post-operative opioid taper instructions:   Complete by: As directed    POST-OPERATIVE OPIOID TAPER INSTRUCTIONS: It is important to wean off of your opioid medication as soon as possible. If you do not need pain medication after your surgery it is ok to stop  day one. Opioids include: Codeine, Hydrocodone(Norco, Vicodin), Oxycodone(Percocet, oxycontin) and hydromorphone amongst others.  Long term and even short term use of opiods can cause: Increased pain response Dependence Constipation Depression Respiratory depression And more.  Withdrawal symptoms can include Flu like symptoms Nausea, vomiting And more Techniques to manage these symptoms Hydrate well Eat regular healthy meals Stay active Use relaxation techniques(deep breathing, meditating, yoga) Do Not substitute Alcohol to help with tapering If you have been on opioids for less than two weeks and do not have pain than it is ok to stop all together.  Plan to wean off of opioids This plan should start within one week post op of your joint replacement. Maintain the same interval or time between taking each dose and first decrease the dose.  Cut the total daily intake of opioids by one tablet each day Next start to increase the time between doses. The last dose that should be eliminated is the evening dose.      Post-operative opioid taper instructions:   Complete by: As directed    POST-OPERATIVE OPIOID TAPER INSTRUCTIONS: It is important to wean off of your opioid medication as soon as possible. If you do not need pain medication after your surgery it is ok to stop day  one. Opioids include: Codeine, Hydrocodone(Norco, Vicodin), Oxycodone(Percocet, oxycontin) and hydromorphone amongst others.  Long term and even short term use of opiods can cause: Increased pain response Dependence Constipation Depression Respiratory depression And more.  Withdrawal symptoms can include Flu like symptoms Nausea, vomiting And more Techniques to manage these symptoms Hydrate well Eat regular healthy meals Stay active Use relaxation techniques(deep breathing, meditating, yoga) Do Not substitute Alcohol to help with tapering If you have been on opioids for less than two weeks and do not have pain than it is ok to stop all together.  Plan to wean off of opioids This plan should start within one week post op of your joint replacement. Maintain the same interval or time between taking each dose and first decrease the dose.  Cut the total daily intake of opioids by one tablet each day Next start to increase the time between doses. The last dose that should be eliminated is the evening dose.      TED hose   Complete by: As directed    Use stockings (TED hose) for 2 weeks on both leg(s).  You may remove them at night for sleeping.   TED hose   Complete by: As directed    Use stockings (TED hose) for 2 weeks on both leg(s).  You may remove them at night for sleeping.   TED hose   Complete by: As directed    Use stockings (TED hose) for 2 weeks on both leg(s).  You may remove them at night for sleeping.        Follow-up Information     Swinteck, Aaron Edelman, MD. Schedule an appointment as soon as possible for a visit in 2 week(s).   Specialty: Orthopedic Surgery Why: For wound re-check Contact information: [address] [place] [address] [phone]                  Signed: Dorothyann Peng 01/22/2021, 10:26 AM

## 2021-01-25 ENCOUNTER — Other Ambulatory Visit: Payer: Self-pay

## 2021-01-25 ENCOUNTER — Ambulatory Visit (INDEPENDENT_AMBULATORY_CARE_PROVIDER_SITE_OTHER): Payer: Medicare Other | Admitting: Family

## 2021-01-25 ENCOUNTER — Ambulatory Visit: Payer: Self-pay | Admitting: Neurology

## 2021-01-25 ENCOUNTER — Encounter: Payer: Self-pay | Admitting: Family

## 2021-01-25 VITALS — BP 142/88 | HR 73 | Temp 97.3°F | Resp 18 | Ht 62.0 in | Wt 184.2 lb

## 2021-01-25 DIAGNOSIS — Z96641 Presence of right artificial hip joint: Secondary | ICD-10-CM

## 2021-01-25 DIAGNOSIS — R17 Unspecified jaundice: Secondary | ICD-10-CM | POA: Diagnosis not present

## 2021-01-25 NOTE — Progress Notes (Signed)
Provider: Marlowe Sax FNP-C  Divonte Senger, [name], NP  Patient Care Team: Montford Barg, [name], NP as PCP - General (Family Medicine) [name], MD as Consulting Physician (Orthopedic Surgery) [name], [name], MD (Neurology) [name], [name], [place] (Optometry) [name], NP as Nurse Practitioner (Obstetrics and Gynecology) [name], MD as Consulting Physician (Dermatology)  Extended Emergency Contact Information Primary Emergency Contact: Cephus Richer) Home Phone: [phone] Mobile Phone: [phone] Relation: Relative Secondary Emergency Contact: [facility] Phone: [phone] Mobile Phone: [phone] Relation: Friend  Code Status:  Full Code  Goals of care: Advanced Directive information Advanced Directives 01/25/2021  Does Patient Have a Medical Advance Directive? Yes  Type of Advance Directive Living will  Does patient want to make changes to medical advance directive? No - Patient declined  Copy of [place] in Chart? -     Chief Complaint  Patient presents with   Acute Visit    Patient complains of yellowing of the skin and eyes.     HPI:  Pt is a 75 y.o. female seen today for an acute visit for evaluation of yellowing of the eyes and skin x since 01/22/2021.states has been throwing up.vomiting bile last episode this morning.took Zofran. States had yellowing of the skin and eyes long time when she was young but resolved.   She is  status post right hip arthroplasty done by Dr.Brian Swinteck.she was prescribed Hydrocodone but stopped 6 days ago.has been taking Tylenol 3000 mg per day. Walking well with a walker.Has upcoming appointment with Orthopedic surgeon.  Has had intermittent diarrhea and constipation which is chronic for her.   Past Medical History:  Diagnosis Date   Allergy    Dry eyes    Endometrial polyp    Essential hypertension 08/25/2018   GERD (gastroesophageal reflux disease)    History of  hiatal hernia    History of kidney stones    Hot flashes, menopausal 10/13/2011   Estradiol started    Jaundice as teenager   no problems since   Memory loss 09/21/2019     1/2 of feet numb all the time   Multiple sclerosis ([place]) dx 2001   Neuropathy    Neuropathy    bilateral feet   Osteoarthritis    Sjogren's syndrome ([place]) dx oct 2021   sore muscvles, dry mouth and eyes   Stroke [facility])    Vertigo    Vision abnormalities    Past Surgical History:  Procedure Laterality Date   ABDOMINAL HYSTERECTOMY  2002   partial   colonscopy  2011   DILATATION & CURETTAGE/HYSTEROSCOPY WITH MYOSURE N/A 06/08/2020   Procedure: DILATATION & CURETTAGE/HYSTEROSCOPY/Polypectomy WITH MYOSURE;  Surgeon: Armandina Stammer, DO;  Location: [facility] [facility];  Service: Gynecology;  Laterality: N/A;   LUMBAR FUSION  2001   TOTAL HIP ARTHROPLASTY Right 01/17/2021   Procedure: TOTAL HIP ARTHROPLASTY ANTERIOR APPROACH;  Surgeon: Rod Can, MD;  [facility];  Service: Orthopedics;  Laterality: Right;   UPPER GI ENDOSCOPY  yrs ago    No Known Allergies  Outpatient Encounter Medications as of 01/25/2021  Medication Sig   aspirin (ASPIRIN CHILDRENS) 81 MG chewable tablet Chew 1 tablet (81 mg total) by mouth 2 (two) times daily with a meal.   CALCIUM-VITAMIN D PO Take 1 tablet by mouth in the morning.   Camphor-Menthol-Methyl Sal (SALONPAS) 3.07-26-08 % PTCH Place 1 patch onto the skin daily as needed (hip pain).   clonazePAM (KLONOPIN) 0.5 MG tablet Take 1  tablet (0.5 mg total) by mouth at bedtime.   docusate sodium (COLACE) 100 MG capsule Take 1 capsule (100 mg total) by mouth 2 (two) times daily.   ezetimibe (ZETIA) 10 MG tablet Take 1 tablet (10 mg total) by mouth daily.   fexofenadine (ALLEGRA) 180 MG tablet Take 180 mg by mouth at bedtime.    Flaxseed, Linseed, (FLAXSEED OIL PO) Take 5-10 mLs by mouth 3 (three) times a week.   leflunomide (ARAVA) 20 MG tablet Take 1 tablet (20 mg total)  by mouth daily.   lidocaine (LMX) 4 % cream Apply 1 application topically at bedtime as needed (neuropathy). With lavender   losartan (COZAAR) 100 MG tablet Take 1 tablet (100 mg total) by mouth daily.   modafinil (PROVIGIL) 200 MG tablet Take 1 tablet (200 mg total) by mouth every morning.   Omega-3 300 MG CAPS Take 300 mg by mouth in the morning.   omeprazole (PRILOSEC) 40 MG capsule TAKE 1 CAPSULE(40 MG) BY MOUTH DAILY   ondansetron (ZOFRAN) 4 MG tablet Take 1 tablet (4 mg total) by mouth every 8 (eight) hours as needed for nausea or vomiting.   solifenacin (VESICARE) 5 MG tablet Take 1 tablet (5 mg total) by mouth daily.   [DISCONTINUED] HYDROcodone-acetaminophen (NORCO) 5-325 MG tablet Take 1 tablet by mouth every 4 (four) hours as needed for moderate pain.   [DISCONTINUED] meloxicam (MOBIC) 15 MG tablet Take 15 mg by mouth in the morning.   [DISCONTINUED] senna (SENOKOT) 8.6 MG TABS tablet Take 2 tablets (17.2 mg total) by mouth at bedtime.   No facility-administered encounter medications on file as of 01/25/2021.    Review of Systems  Constitutional:  Negative for appetite change, chills, fatigue, fever and unexpected weight change.  HENT:  Negative for congestion, dental problem, ear discharge, ear pain, facial swelling, hearing loss, nosebleeds, postnasal drip, rhinorrhea, sinus pressure, sinus pain, sneezing, sore throat, tinnitus and trouble swallowing.   Eyes:  Positive for visual disturbance. Negative for pain, discharge, redness and itching.       Reports yellowness of the eyes   Respiratory:  Negative for cough, chest tightness, shortness of breath and wheezing.   Cardiovascular:  Negative for chest pain, palpitations and leg swelling.  Gastrointestinal:  Negative for abdominal distention, abdominal pain, blood in stool, constipation, diarrhea, nausea and vomiting.  Genitourinary:  Negative for difficulty urinating, dysuria, flank pain, frequency and urgency.  Musculoskeletal:   Positive for arthralgias and gait problem. Negative for back pain, joint swelling, myalgias, neck pain and neck stiffness.       Status post right hip replacement 01/17/2021   Skin:  Positive for color change. Negative for pallor, rash and wound.       Yellowness of the skin and eyes   Neurological:  Negative for dizziness, syncope, speech difficulty, weakness, light-headedness, numbness and headaches.  Hematological:  Does not bruise/bleed easily.  Psychiatric/Behavioral:  Negative for agitation, behavioral problems, confusion, hallucinations, self-injury, sleep disturbance and suicidal ideas. The patient is not nervous/anxious.    Immunization History  Administered Date(s) Administered   Fluad Quad(high Dose 65+) 04/01/2019   Influenza Whole 05/14/2011   Influenza, High Dose Seasonal PF 04/30/2017, 05/15/2018, 05/18/2020   PFIZER(Purple Top)SARS-COV-2 Vaccination 08/27/2019, 09/21/2019, 03/07/2020   Pneumococcal Conjugate-13 08/30/2018   Pneumococcal Polysaccharide-23 03/21/2009, 04/20/2020   Td 12/04/2009   Zoster, Live 11/29/2009   Pertinent  Health Maintenance Due  Topic Date Due   DEXA SCAN  Never done   INFLUENZA VACCINE  02/18/2021  MAMMOGRAM  03/14/2022   PNA vac Low Risk Adult  Completed   Fall Risk  01/25/2021 01/16/2021 11/29/2020 11/21/2020 10/11/2020  Falls in the past year? 0 0 0 0 0  Number falls in past yr: 0 0 0 0 0  Injury with Fall? 0 0 0 - 0  Risk for fall due to : No Fall Risks No Fall Risks - - -  Risk for fall due to: Comment - - - - -  Follow up Falls evaluation completed Falls evaluation completed - - -   Functional Status Survey:    Vitals:   01/25/21 1455  BP: (!) 142/88  Pulse: 73  Resp: 18  Temp: (!) 97.3 F (36.3 C)  SpO2: 93%  Weight: 184 lb 3.2 oz (83.6 kg)  Height: '5\' 2"'  (1.575 m)   Body mass index is 33.69 kg/m. Physical Exam Vitals reviewed.  Constitutional:      General: She is not in acute distress.    Appearance: Normal appearance.  She is obese. She is not ill-appearing or diaphoretic.  HENT:     Head: Normocephalic.     Right Ear: Tympanic membrane, ear canal and external ear normal. There is no impacted cerumen.     Left Ear: Tympanic membrane, ear canal and external ear normal. There is no impacted cerumen.     Nose: Nose normal. No congestion or rhinorrhea.     Mouth/Throat:     Mouth: Mucous membranes are moist.     Pharynx: Oropharynx is clear. No oropharyngeal exudate or posterior oropharyngeal erythema.  Eyes:     General: No scleral icterus.       Right eye: No discharge.        Left eye: No discharge.     Extraocular Movements: Extraocular movements intact.     Conjunctiva/sclera: Conjunctivae normal.     Pupils: Pupils are equal, round, and reactive to light.  Neck:     Vascular: No carotid bruit.  Cardiovascular:     Rate and Rhythm: Normal rate and regular rhythm.     Pulses: Normal pulses.     Heart sounds: Normal heart sounds. No murmur heard.   No friction rub. No gallop.  Pulmonary:     Effort: Pulmonary effort is normal. No respiratory distress.     Breath sounds: Normal breath sounds. No wheezing, rhonchi or rales.  Chest:     Chest wall: No tenderness.  Abdominal:     General: Bowel sounds are normal. There is no distension.     Palpations: Abdomen is soft. There is no mass.     Tenderness: There is no abdominal tenderness. There is no right CVA tenderness, left CVA tenderness, guarding or rebound.  Musculoskeletal:        General: No swelling or tenderness. Normal range of motion.     Cervical back: Normal range of motion. No rigidity or tenderness.     Right lower leg: No edema.     Left lower leg: No edema.     Comments: Unsteady gait ambulating with a walker today post right hip arthroplasty.  Lymphadenopathy:     Cervical: No cervical adenopathy.  Skin:    General: Skin is warm and dry.     Coloration: Skin is not jaundiced or pale.     Findings: No bruising, erythema, lesion  or rash.     Comments: Right anterior hip surgical dressing dry ,clean and intact.surrounding skin tissue without any erythema.   Neurological:  Mental Status: She is alert and oriented to person, place, and time.     Cranial Nerves: No cranial nerve deficit.     Sensory: No sensory deficit.     Motor: No weakness.     Coordination: Coordination normal.     Gait: Gait abnormal.  Psychiatric:        Mood and Affect: Mood normal.        Speech: Speech normal.        Behavior: Behavior normal.        Thought Content: Thought content normal.        Judgment: Judgment normal.    Labs reviewed: Recent Labs    11/29/20 1341 01/07/21 1122 01/18/21 0243  NA 139 138 140  K 4.6 4.0 4.3  CL 105 106 108  CO2 '24 26 27  ' GLUCOSE 87 94 121*  BUN '19 20 16  ' CREATININE 0.77 0.75 0.60  CALCIUM 9.6 9.4 8.9   Recent Labs    04/04/20 1132 10/02/20 1607 11/19/20 0945 01/07/21 1122  AST 38 '28 24 21  ' ALT 65* 69* 25 20  ALKPHOS 192* 161*  --  113  BILITOT 0.2 <0.2 0.4 0.5  PROT 6.7 6.1 6.1 6.7  ALBUMIN 4.5 4.2  --  4.5   Recent Labs    04/04/20 1132 06/08/20 1030 10/02/20 1607 11/19/20 0945 01/07/21 1122 01/18/21 0243  WBC 5.0   < > 9.4 3.8 5.5 11.4*  NEUTROABS 3.3  --  7.6* 2,778  --   --   HGB 13.2   < > 13.4 14.0 13.6 11.1*  HCT 40.9   < > 39.8 42.8 42.0 35.4*  MCV 92   < > 88 89.7 91.5 91.9  PLT 211   < > 223 183 217 194   < > = values in this interval not displayed.   Lab Results  Component Value Date   TSH 4.46 11/19/2020   No results found for: HGBA1C Lab Results  Component Value Date   CHOL 257 (H) 11/19/2020   HDL 44 (L) 11/19/2020   LDLCALC 174 (H) 11/19/2020   LDLDIRECT 130.2 11/03/2011   TRIG 231 (H) 11/19/2020   CHOLHDL 5.8 (H) 11/19/2020    Significant Diagnostic Results in last 30 days:  DG Pelvis Portable  Result Date: 01/17/2021 CLINICAL DATA:  Hip arthroplasty EXAM: PORTABLE PELVIS 1-2 VIEWS COMPARISON:  Same day radiograph FINDINGS: There is a  right hip arthroplasty in normal alignment without evidence of loosening or fracture. Expected soft tissue changes. IMPRESSION: No evidence of right hip arthroplasty complication on single frontal view. Electronically Signed   By: Maurine Simmering   On: 01/17/2021 11:27   DG C-Arm 1-60 Min-No Report  Result Date: 01/17/2021 CLINICAL DATA:  Status post total hip replacement EXAM: OPERATIVE RIGHT HIP  1 VIEW TECHNIQUE: Fluoroscopic spot image(s) were submitted for interpretation post-operatively. COMPARISON:  MR arthrography right hip October 31, 2020 FLUOROSCOPY TIME:  0 minutes 12 seconds; 4 acquired images FINDINGS: Frontal views demonstrate total hip replacement on the right with prosthetic components well-seated on frontal view. No fracture or dislocation. Left hip joint appears unremarkable. IMPRESSION: Total hip replacement on the right with prosthetic components well-seated on frontal view. No evident fracture or dislocation. Electronically Signed   By: Lowella Grip III M.D.   On: 01/17/2021 09:56   DG HIP OPERATIVE UNILAT W OR W/O PELVIS RIGHT  Result Date: 01/17/2021 CLINICAL DATA:  Status post total hip replacement EXAM: OPERATIVE RIGHT HIP  1  VIEW TECHNIQUE: Fluoroscopic spot image(s) were submitted for interpretation post-operatively. COMPARISON:  MR arthrography right hip October 31, 2020 FLUOROSCOPY TIME:  0 minutes 12 seconds; 4 acquired images FINDINGS: Frontal views demonstrate total hip replacement on the right with prosthetic components well-seated on frontal view. No fracture or dislocation. Left hip joint appears unremarkable. IMPRESSION: Total hip replacement on the right with prosthetic components well-seated on frontal view. No evident fracture or dislocation. Electronically Signed   By: Lowella Grip III M.D.   On: 01/17/2021 09:56    Assessment/Plan  1. S/P total right hip arthroplasty Right anterior hip surgical dressing clean,dry and intact surrounding skin without any signs  of infection. - follow up with Orthopedic Dr.Swinteck as directed. - pain under controlled on current pain regimen.off Hydrocodone.  - CBC with Differential/Platelet  2. Yellow skin Reports bile colored emesis with yellowing of the skin and  eye but no signs of jaundice noted on exam.will obtain labs to evaluate live enzyme.  - CMP with eGFR([facility])  Family/ staff Communication: Reviewed plan of care with patient verbalized understanding.  Labs/tests ordered:  - CBC with Differential/Platelet - CMP with eGFR([facility])  Next Appointment: Has appointment 03/01/2021 with fasting labs prior.  Sandrea Hughs, NP

## 2021-01-26 LAB — CBC WITH DIFFERENTIAL/PLATELET
Absolute Monocytes: 483 cells/uL (ref 200–950)
Basophils Absolute: 48 cells/uL (ref 0–200)
Basophils Relative: 0.7 %
Eosinophils Absolute: 179 cells/uL (ref 15–500)
Eosinophils Relative: 2.6 %
HCT: 35.7 % (ref 35.0–45.0)
Hemoglobin: 11.4 g/dL — ABNORMAL LOW (ref 11.7–15.5)
Lymphs Abs: 932 cells/uL (ref 850–3900)
MCH: 28.6 pg (ref 27.0–33.0)
MCHC: 31.9 g/dL — ABNORMAL LOW (ref 32.0–36.0)
MCV: 89.7 fL (ref 80.0–100.0)
MPV: 10.8 fL (ref 7.5–12.5)
Monocytes Relative: 7 %
Neutro Abs: 5258 cells/uL (ref 1500–7800)
Neutrophils Relative %: 76.2 %
Platelets: 303 10*3/uL (ref 140–400)
RBC: 3.98 10*6/uL (ref 3.80–5.10)
RDW: 13.4 % (ref 11.0–15.0)
Total Lymphocyte: 13.5 %
WBC: 6.9 10*3/uL (ref 3.8–10.8)

## 2021-01-26 LAB — COMPLETE METABOLIC PANEL WITH GFR
AG Ratio: 1.9 (calc) (ref 1.0–2.5)
ALT: 50 U/L — ABNORMAL HIGH (ref 6–29)
AST: 32 U/L (ref 10–35)
Albumin: 3.7 g/dL (ref 3.6–5.1)
Alkaline phosphatase (APISO): 210 U/L — ABNORMAL HIGH (ref 37–153)
BUN/Creatinine Ratio: 34 (calc) — ABNORMAL HIGH (ref 6–22)
BUN: 20 mg/dL (ref 7–25)
CO2: 24 mmol/L (ref 20–32)
Calcium: 9.4 mg/dL (ref 8.6–10.4)
Chloride: 108 mmol/L (ref 98–110)
Creat: 0.59 mg/dL — ABNORMAL LOW (ref 0.60–0.93)
GFR, Est African American: 105 mL/min/{1.73_m2} (ref >=60)
GFR, Est Non African American: 91 mL/min/{1.73_m2} (ref >=60)
Globulin: 2 g/dL (calc) (ref 1.9–3.7)
Glucose, Bld: 85 mg/dL (ref 65–139)
Potassium: 4.3 mmol/L (ref 3.5–5.3)
Sodium: 141 mmol/L (ref 135–146)
Total Bilirubin: 0.4 mg/dL (ref 0.2–1.2)
Total Protein: 5.7 g/dL — ABNORMAL LOW (ref 6.1–8.1)

## 2021-02-04 DIAGNOSIS — Z96641 Presence of right artificial hip joint: Secondary | ICD-10-CM | POA: Diagnosis not present

## 2021-02-04 DIAGNOSIS — Z471 Aftercare following joint replacement surgery: Secondary | ICD-10-CM | POA: Diagnosis not present

## 2021-02-26 ENCOUNTER — Other Ambulatory Visit: Payer: Medicare Other

## 2021-03-01 ENCOUNTER — Ambulatory Visit: Payer: Medicare Other | Admitting: Family

## 2021-03-04 DIAGNOSIS — M545 Low back pain, unspecified: Secondary | ICD-10-CM | POA: Diagnosis not present

## 2021-03-04 DIAGNOSIS — Z96641 Presence of right artificial hip joint: Secondary | ICD-10-CM | POA: Diagnosis not present

## 2021-03-04 DIAGNOSIS — Z471 Aftercare following joint replacement surgery: Secondary | ICD-10-CM | POA: Diagnosis not present

## 2021-03-07 ENCOUNTER — Telehealth: Payer: Self-pay | Admitting: Neurology

## 2021-03-07 DIAGNOSIS — M545 Low back pain, unspecified: Secondary | ICD-10-CM

## 2021-03-07 DIAGNOSIS — M5412 Radiculopathy, cervical region: Secondary | ICD-10-CM

## 2021-03-07 DIAGNOSIS — R269 Unspecified abnormalities of gait and mobility: Secondary | ICD-10-CM

## 2021-03-07 DIAGNOSIS — M7061 Trochanteric bursitis, right hip: Secondary | ICD-10-CM

## 2021-03-07 DIAGNOSIS — G35 Multiple sclerosis: Secondary | ICD-10-CM

## 2021-03-07 DIAGNOSIS — G8929 Other chronic pain: Secondary | ICD-10-CM

## 2021-03-07 NOTE — Telephone Encounter (Signed)
Called pt back. She states they are requesting new referral. Advised we will place this for her. She already has appt w/ them next week set up. Nothing further needed.

## 2021-03-07 NOTE — Telephone Encounter (Signed)
Pt called stating she needs a new prescription for PT at [facility] with Dr. Cyd Silence. Pt requesting a call back.

## 2021-03-11 ENCOUNTER — Telehealth: Payer: Self-pay | Admitting: Neurology

## 2021-03-11 NOTE — Telephone Encounter (Signed)
Sent to [facility] ph #[phone]

## 2021-03-13 ENCOUNTER — Ambulatory Visit (HOSPITAL_BASED_OUTPATIENT_CLINIC_OR_DEPARTMENT_OTHER): Payer: Medicare Other | Attending: Neurology | Admitting: Physical Therapy

## 2021-03-13 ENCOUNTER — Other Ambulatory Visit: Payer: Self-pay

## 2021-03-13 ENCOUNTER — Encounter (HOSPITAL_BASED_OUTPATIENT_CLINIC_OR_DEPARTMENT_OTHER): Payer: Self-pay | Admitting: Physical Therapy

## 2021-03-13 DIAGNOSIS — R262 Difficulty in walking, not elsewhere classified: Secondary | ICD-10-CM | POA: Diagnosis not present

## 2021-03-13 DIAGNOSIS — G8929 Other chronic pain: Secondary | ICD-10-CM | POA: Insufficient documentation

## 2021-03-13 DIAGNOSIS — M6281 Muscle weakness (generalized): Secondary | ICD-10-CM | POA: Insufficient documentation

## 2021-03-13 DIAGNOSIS — M545 Low back pain, unspecified: Secondary | ICD-10-CM | POA: Insufficient documentation

## 2021-03-13 DIAGNOSIS — M542 Cervicalgia: Secondary | ICD-10-CM | POA: Insufficient documentation

## 2021-03-13 DIAGNOSIS — R252 Cramp and spasm: Secondary | ICD-10-CM

## 2021-03-14 ENCOUNTER — Encounter (HOSPITAL_BASED_OUTPATIENT_CLINIC_OR_DEPARTMENT_OTHER): Payer: Self-pay | Admitting: Physical Therapy

## 2021-03-14 NOTE — Therapy (Signed)
[facility] [address] [place], [place], [address] Phone: [phone]   Fax:  [phone]  Physical Therapy Treatment  Patient Details  Name: Dawn [name] MRN: [id number] Date of Birth: 1945/08/20 Referring Provider (PT): Dr Arlice Colt   Encounter Date: 03/13/2021   PT End of Session - 03/14/21 1211     Visit Number 1    Number of Visits 12    Date for PT Re-Evaluation 06/06/21    Authorization Type progress note on visit 10    PT Start Time 0930    PT Stop Time 1013    PT Time Calculation (min) 43 min    Activity Tolerance Patient tolerated treatment well    Behavior During Therapy [facility] for tasks assessed/performed             Past Medical History:  Diagnosis Date   Allergy    Dry eyes    Endometrial polyp    Essential hypertension 08/25/2018   GERD (gastroesophageal reflux disease)    History of hiatal hernia    History of kidney stones    Hot flashes, menopausal 10/13/2011   Estradiol started    Jaundice as teenager   no problems since   Memory loss 09/21/2019     1/2 of feet numb all the time   Multiple sclerosis ([place]) dx 2001   Neuropathy    Neuropathy    bilateral feet   Osteoarthritis    Sjogren's syndrome ([place]) dx oct 2021   sore muscvles, dry mouth and eyes   Stroke [facility])    Vertigo    Vision abnormalities     Past Surgical History:  Procedure Laterality Date   ABDOMINAL HYSTERECTOMY  2002   partial   colonscopy  2011   DILATATION & CURETTAGE/HYSTEROSCOPY WITH MYOSURE N/A 06/08/2020   Procedure: DILATATION & CURETTAGE/HYSTEROSCOPY/Polypectomy WITH MYOSURE;  Surgeon: Armandina Stammer, DO;  Location: [facility];  Service: Gynecology;  Laterality: N/A;   LUMBAR FUSION  2001   TOTAL HIP ARTHROPLASTY Right 01/17/2021   Procedure: TOTAL HIP ARTHROPLASTY ANTERIOR APPROACH;  Surgeon: Rod Can, MD;  [facility];  Service: Orthopedics;  Laterality: Right;   UPPER  GI ENDOSCOPY  yrs ago    There were no vitals filed for this visit.       [facility] PT Assessment - 03/14/21 0001       Assessment   Medical Diagnosis Low Back Pain/ Right THA/ Gait distuebance    Referring Provider (PT) Dr Arlice Colt    Hand Dominance Right    Next MD Visit January    Prior Therapy Had therapy rpior to surgery      Precautions   Precautions None      Restrictions   Weight Bearing Restrictions No      Balance Screen   Has the patient fallen in the past 6 months No    Has the patient had a decrease in activity level because of a fear of falling?  No    Is the patient reluctant to leave their home because of a fear of falling?  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  Cognition   Overall Cognitive Status Within Functional Limits for tasks assessed    Attention Focused    Focused Attention Appears intact    Memory Appears intact    Awareness Appears intact    Problem Solving Appears intact    Executive Function Reasoning      Sensation   Light Touch Appears Intact    Additional Comments numbness inthe lateral hip following surgery      Coordination   Gross Motor Movements are Fluid and Coordinated Yes    Fine Motor Movements are Fluid and Coordinated Yes      Posture/Postural Control   Posture Comments stands wth flexed posture      PROM   Right Hip Flexion 105    Left Hip Flexion 110      Strength   Right Hip Flexion 4/5    Right Hip ABduction 4/5    Left Hip Flexion 4+/5    Left Hip ABduction 4+/5    Right Knee Flexion 4/5    Right Knee Extension 4/5    Left Knee Flexion 4+/5    Left Knee Extension 4+/5      Palpation   Palpation comment tenderess to palpation n the mid back       Ambulation/Gait   Gait Comments decreased gait speed. flexed trunk; decreased bilateral hip flexion                           [facility] Adult PT Treatment/Exercise - 03/14/21 0001       Lumbar Exercises: Stretches   Active Hamstring Stretch 3 reps;20 seconds    Lower Trunk Rotation Limitations x20                      PT Short Term Goals - 03/14/21 1352       PT SHORT TERM GOAL #1   Title Patient will report a 50% reduction in lower back pain    Time 4    Period Weeks    Status New    Target Date 01/17/21      PT SHORT TERM GOAL #2   Title Patient will increase distance on 6 min walk test by 200'    Time 4    Period Weeks    Status New    Target Date 04/11/21      PT SHORT TERM GOAL #3   Title Patient increase right hip flexion strength to 4+/5    Time 4    Period Weeks    Status New    Target Date 04/11/21               PT Long Term Goals - 03/14/21 1356       PT LONG TERM GOAL #1   Title Patient will return to [facility] with a full program for her hip    Time 12    Period Weeks    Status New    Target Date 06/06/21      PT LONG TERM GOAL #2   Title Patient will ambualte 1500' on the 6 min walk test in order to improve community ambualtion    Time 12    Period Weeks    Status New    Target Date 06/06/21      PT LONG TERM GOAL #3   Title Patient will stand for 30 min without a significant increase in pain in order to perfrom ADL's  Time 12    Period Weeks    Status New    Target Date 06/06/21                   Plan - 03/14/21 1345     Clinical Impression Statement Patient is a 75 year old female S/P right THA on 01/17/2021. She had no therapy following the surgery. Her hip is significantly better then it was following surgery. She comes in today with lower back pain and decreased overall mobility. She is using a cane for primary mobility. She has been hesitant to work on her back exerciwses and  stretches 2nd to [place]. She has significantly improved right hip strength and flexion, but her strength is still limited.    Personal Factors and Comorbidities Comorbidity 1;Comorbidity 2    Comorbidities MS, OA    Examination-Activity Limitations Locomotion Level;Carry;Stand;Lift;Stairs;Squat    Examination-Participation Restrictions Meal Prep;Cleaning;Community Activity;Laundry;Shop    Stability/Clinical Decision Making Stable/Uncomplicated    Clinical Decision Making Low    Rehab Potential Good    PT Frequency 1x / week    PT Duration 8 weeks    PT Treatment/Interventions ADLs/Self Care Home Management;Electrical Stimulation;Cryotherapy;Iontophoresis '4mg'$ /ml Dexamethasone;Moist Heat;Traction;DME Instruction;Neuromuscular re-education;Patient/family education;Manual techniques;Passive range of motion;Taping;Therapeutic activities;Therapeutic exercise;Balance training;Gait training;Stair training;Functional mobility training;Dry needling;Ultrasound    PT Next Visit Plan prehab for hip replacement; continue with strengthening and manual therapy; consider pool therapy for prehab    PT Home Exercise Plan reviewed seated hamstring stretch and LTR. She has done those in the past.    Consulted and Agree with Plan of Care Patient             Patient will benefit from skilled therapeutic intervention in order to improve the following deficits and impairments:  Abnormal gait, Difficulty walking, Decreased range of motion, Decreased mobility, Decreased strength, Postural dysfunction, Pain, Increased muscle spasms  Visit Diagnosis: Chronic bilateral low back pain without sciatica  Muscle weakness (generalized)  Difficulty in walking, not elsewhere classified  Cervicalgia  Cramp and spasm     Problem List Patient Active Problem List   Diagnosis Date Noted   Osteoarthritis of right hip 01/17/2021   Status post THR (total hip replacement) 01/17/2021   Chronic bilateral low back pain without  sciatica 10/02/2020   Chronic pain syndrome 06/20/2020   Post laminectomy syndrome 06/20/2020   Sjogren's disease ([place]) 05/23/2020   Primary osteoarthritis of both hands 05/23/2020   Primary osteoarthritis of both feet 05/23/2020   Sacroiliitis, not elsewhere classified ([place]) 12/22/2019   Other secondary scoliosis, lumbar region 12/22/2019   Spondylosis without myelopathy or radiculopathy, lumbar region 12/22/2019   Cervical radiculopathy 10/18/2019   Numbness 09/21/2019   Bilateral carpal tunnel syndrome 09/21/2019   High risk medication use 09/21/2019   Memory loss 09/21/2019   Essential hypertension 08/25/2018   Abnormal SPEP 02/19/2018   Hand pain 02/20/2017   Multiple joint pain 02/18/2017   Right elbow pain 12/09/2016   Disturbed cognition 06/25/2016   Trochanteric bursitis of left hip 02/18/2016   Sciatica, right side 10/30/2015   Bilateral arm pain 10/10/2015   Trochanteric bursitis of both hips 08/04/2014   Chronic fatigue 08/04/2014   Urinary frequency 08/04/2014   Abnormality of gait 08/04/2014   Unspecified visual disturbance 01/31/2013   Transient vision disturbance 01/31/2013   Routine general medical examination at a health care facility 11/04/2011   Colon polyps 11/03/2011   Hot flashes, menopausal 10/13/2011   POSTHERPETIC NEURALGIA 10/03/2009   DISPLCMT LUMBAR INTERVERT [place]  W/O MYELOPATHY 09/05/2009   RENAL CALCULUS, RECURRENT 09/04/2009   Hyperlipidemia 08/31/2009   MULTIPLE SCLEROSIS, PROGRESSIVE/RELAPSING 08/31/2009   ALLERGIC RHINITIS 08/31/2009    Carney Living PT DPT  03/14/2021, 9:34 PM  [facility] [address] [place], [place], [address] Phone: [phone]   Fax:  [phone]  Name: Dawn [name] MRN: [id number] Date of Birth: [birth year]

## 2021-03-15 ENCOUNTER — Other Ambulatory Visit: Payer: Self-pay | Admitting: Neurology

## 2021-03-15 ENCOUNTER — Ambulatory Visit: Payer: Medicare Other

## 2021-03-15 ENCOUNTER — Other Ambulatory Visit: Payer: Medicare Other

## 2021-03-20 ENCOUNTER — Ambulatory Visit (HOSPITAL_BASED_OUTPATIENT_CLINIC_OR_DEPARTMENT_OTHER): Payer: Medicare Other | Admitting: Physical Therapy

## 2021-03-20 ENCOUNTER — Other Ambulatory Visit: Payer: Self-pay

## 2021-03-20 DIAGNOSIS — R252 Cramp and spasm: Secondary | ICD-10-CM

## 2021-03-20 DIAGNOSIS — M6281 Muscle weakness (generalized): Secondary | ICD-10-CM

## 2021-03-20 DIAGNOSIS — G8929 Other chronic pain: Secondary | ICD-10-CM | POA: Diagnosis not present

## 2021-03-20 DIAGNOSIS — M542 Cervicalgia: Secondary | ICD-10-CM | POA: Diagnosis not present

## 2021-03-20 DIAGNOSIS — R262 Difficulty in walking, not elsewhere classified: Secondary | ICD-10-CM | POA: Diagnosis not present

## 2021-03-20 DIAGNOSIS — M545 Low back pain, unspecified: Secondary | ICD-10-CM | POA: Diagnosis not present

## 2021-03-21 ENCOUNTER — Encounter (HOSPITAL_BASED_OUTPATIENT_CLINIC_OR_DEPARTMENT_OTHER): Payer: Self-pay | Admitting: Physical Therapy

## 2021-03-21 NOTE — Therapy (Signed)
[facility] [address] [place], [place], [address] Phone: [phone]   Fax:  [phone]  Physical Therapy Treatment  Patient Details  Name: Dawn [name] MRN: [id number] Date of Birth: 09-04-1945 Referring Provider (PT): Dr Arlice Colt   Encounter Date: 03/20/2021   PT End of Session - 03/21/21 0905     Visit Number 2    Number of Visits 12    Date for PT Re-Evaluation 06/06/21    Authorization Type progress note on visit 10    PT Start Time 0930    PT Stop Time 1012    PT Time Calculation (min) 42 min    Activity Tolerance Patient tolerated treatment well    Behavior During Therapy [facility] for tasks assessed/performed             Past Medical History:  Diagnosis Date   Allergy    Dry eyes    Endometrial polyp    Essential hypertension 08/25/2018   GERD (gastroesophageal reflux disease)    History of hiatal hernia    History of kidney stones    Hot flashes, menopausal 10/13/2011   Estradiol started    Jaundice as teenager   no problems since   Memory loss 09/21/2019     1/2 of feet numb all the time   Multiple sclerosis ([place]) dx 2001   Neuropathy    Neuropathy    bilateral feet   Osteoarthritis    Sjogren's syndrome ([place]) dx oct 2021   sore muscvles, dry mouth and eyes   Stroke [facility])    Vertigo    Vision abnormalities     Past Surgical History:  Procedure Laterality Date   ABDOMINAL HYSTERECTOMY  2002   partial   colonscopy  2011   DILATATION & CURETTAGE/HYSTEROSCOPY WITH MYOSURE N/A 06/08/2020   Procedure: DILATATION & CURETTAGE/HYSTEROSCOPY/Polypectomy WITH MYOSURE;  Surgeon: Armandina Stammer, DO;  Location: [facility];  Service: Gynecology;  Laterality: N/A;   LUMBAR FUSION  2001   TOTAL HIP ARTHROPLASTY Right 01/17/2021   Procedure: TOTAL HIP ARTHROPLASTY ANTERIOR APPROACH;  Surgeon: Rod Can, MD;  Location: [facility];  Service: Orthopedics;  Laterality: Right;   UPPER  GI ENDOSCOPY  yrs ago    There were no vitals filed for this visit.   Subjective Assessment - 03/20/21 0937     Subjective Patient reports her hips have been stiff. Her low back has been hurting pretty bad. The patient has changed her matress which has helped.    Pertinent History MS    How long can you stand comfortably? hurts as soon as she stands. Has to shift frequently    How long can you walk comfortably? limited community ambualtion    Diagnostic tests Nothing recent    Currently in Pain? No/denies                [facility] PT Assessment - 03/21/21 0001       Assessment   Medical Diagnosis Low Back Pain/ Right THA/ Gait distuebance    Referring Provider (PT) Dr Arlice Colt                           [facility] Adult PT Treatment/Exercise - 03/21/21 0001       Neck Exercises: Standing   Other Standing Exercises slow march 2x10; hell traise x20      Lumbar Exercises: Stretches   Active Hamstring Stretch 3 reps;20 seconds  Lower Trunk Rotation Limitations x20      Lumbar Exercises: Seated   LAQ on Chair Weights (lbs) x20 red band    Other Seated Lumbar Exercises hamstring curl x20 red    Other Seated Lumbar Exercises seated clamshell with band 3x10 red      Manual Therapy   Soft tissue mobilization trigger point release to the upper gluteal and lumbar paraspinal. roller to bilateral gluteals                    PT Education - 03/21/21 0905     Education Details reviewed self soft tissue mobilization to the lumbar spine    Person(s) Educated Patient    Methods Explanation;Tactile cues;Demonstration;Verbal cues    Comprehension Verbalized understanding;Returned demonstration;Verbal cues required;Tactile cues required              PT Short Term Goals - 03/14/21 1352       PT SHORT TERM GOAL #1   Title Patient will report a 50% reduction in lower back pain    Time 4    Period Weeks    Status New    Target Date 01/17/21      PT  SHORT TERM GOAL #2   Title Patient will increase distance on 6 min walk test by 200'    Time 4    Period Weeks    Status New    Target Date 04/11/21      PT SHORT TERM GOAL #3   Title Patient increase right hip flexion strength to 4+/5    Time 4    Period Weeks    Status New    Target Date 04/11/21               PT Long Term Goals - 03/14/21 1356       PT LONG TERM GOAL #1   Title Patient will return to [facility] with a full program for her hip    Time 12    Period Weeks    Status New    Target Date 06/06/21      PT LONG TERM GOAL #2   Title Patient will ambualte 1500' on the 6 min walk test in order to improve community ambualtion    Time 12    Period Weeks    Status New    Target Date 06/06/21      PT LONG TERM GOAL #3   Title Patient will stand for 30 min without a significant increase in pain in order to perfrom ADL's    Time 12    Period Weeks    Status New    Target Date 06/06/21                   Plan - 03/21/21 0907     Clinical Impression Statement Patient was somewhaty limited by lower back pain to begin the session but felt better at the end. Therapy focused on manual therapy to the lumbar spine which improved her motion. Therapy revoiewed self stretching and reviewed hiom exercises that she can eprfrom. She tolerated exercises well and reported feeling looser after the session> her hips bolth felt stiff today but the surgical side was the same as the non-surgical side.    Personal Factors and Comorbidities Comorbidity 1;Comorbidity 2    Comorbidities MS, OA    Examination-Activity Limitations Locomotion Level;Carry;Stand;Lift;Stairs;Squat    Examination-Participation Restrictions Meal Prep;Cleaning;Community Activity;Laundry;Shop    Stability/Clinical Decision Making Stable/Uncomplicated  Clinical Decision Making Low    Rehab Potential Good    PT Frequency 1x / week    PT Duration 8 weeks    PT Treatment/Interventions ADLs/Self Care  Home Management;Electrical Stimulation;Cryotherapy;Iontophoresis '4mg'$ /ml Dexamethasone;Moist Heat;Traction;DME Instruction;Neuromuscular re-education;Patient/family education;Manual techniques;Passive range of motion;Taping;Therapeutic activities;Therapeutic exercise;Balance training;Gait training;Stair training;Functional mobility training;Dry needling;Ultrasound    PT Next Visit Plan prehab for hip replacement; continue with strengthening and manual therapy; consider pool therapy for prehab    PT Home Exercise Plan reviewed seated hamstring stretch and LTR. She has done those in the past.    Consulted and Agree with Plan of Care Patient             Patient will benefit from skilled therapeutic intervention in order to improve the following deficits and impairments:  Abnormal gait, Difficulty walking, Decreased range of motion, Decreased mobility, Decreased strength, Postural dysfunction, Pain, Increased muscle spasms  Visit Diagnosis: Chronic bilateral low back pain without sciatica  Muscle weakness (generalized)  Difficulty in walking, not elsewhere classified  Cervicalgia  Cramp and spasm     Problem List Patient Active Problem List   Diagnosis Date Noted   Osteoarthritis of right hip 01/17/2021   Status post THR (total hip replacement) 01/17/2021   Chronic bilateral low back pain without sciatica 10/02/2020   Chronic pain syndrome 06/20/2020   Post laminectomy syndrome 06/20/2020   Sjogren's disease ([place]) 05/23/2020   Primary osteoarthritis of both hands 05/23/2020   Primary osteoarthritis of both feet 05/23/2020   Sacroiliitis, not elsewhere classified ([place]) 12/22/2019   Other secondary scoliosis, lumbar region 12/22/2019   Spondylosis without myelopathy or radiculopathy, lumbar region 12/22/2019   Cervical radiculopathy 10/18/2019   Numbness 09/21/2019   Bilateral carpal tunnel syndrome 09/21/2019   High risk medication use 09/21/2019   Memory loss 09/21/2019    Essential hypertension 08/25/2018   Abnormal SPEP 02/19/2018   Hand pain 02/20/2017   Multiple joint pain 02/18/2017   Right elbow pain 12/09/2016   Disturbed cognition 06/25/2016   Trochanteric bursitis of left hip 02/18/2016   Sciatica, right side 10/30/2015   Bilateral arm pain 10/10/2015   Trochanteric bursitis of both hips 08/04/2014   Chronic fatigue 08/04/2014   Urinary frequency 08/04/2014   Abnormality of gait 08/04/2014   Unspecified visual disturbance 01/31/2013   Transient vision disturbance 01/31/2013   Routine general medical examination at a health care facility 11/04/2011   Colon polyps 11/03/2011   Hot flashes, menopausal 10/13/2011   POSTHERPETIC NEURALGIA 10/03/2009   DISPLCMT LUMBAR INTERVERT DISC W/O MYELOPATHY 09/05/2009   RENAL CALCULUS, RECURRENT 09/04/2009   Hyperlipidemia 08/31/2009   MULTIPLE SCLEROSIS, PROGRESSIVE/RELAPSING 08/31/2009   ALLERGIC RHINITIS 08/31/2009    Carney Living PT DPT  03/21/2021, 9:13 AM  [facility] [address] [place], [place], [address] Phone: [phone]   Fax:  [phone]  Name: ESSIE TALBURT MRN: [id number] Date of Birth: [birth year]

## 2021-03-27 ENCOUNTER — Ambulatory Visit (HOSPITAL_BASED_OUTPATIENT_CLINIC_OR_DEPARTMENT_OTHER): Payer: Medicare Other | Attending: Neurology | Admitting: Physical Therapy

## 2021-03-27 ENCOUNTER — Other Ambulatory Visit: Payer: Self-pay

## 2021-03-27 ENCOUNTER — Encounter (HOSPITAL_BASED_OUTPATIENT_CLINIC_OR_DEPARTMENT_OTHER): Payer: Self-pay | Admitting: Physical Therapy

## 2021-03-27 DIAGNOSIS — R252 Cramp and spasm: Secondary | ICD-10-CM

## 2021-03-27 DIAGNOSIS — G8929 Other chronic pain: Secondary | ICD-10-CM | POA: Diagnosis not present

## 2021-03-27 DIAGNOSIS — R262 Difficulty in walking, not elsewhere classified: Secondary | ICD-10-CM | POA: Diagnosis not present

## 2021-03-27 DIAGNOSIS — M6281 Muscle weakness (generalized): Secondary | ICD-10-CM | POA: Diagnosis not present

## 2021-03-27 DIAGNOSIS — M542 Cervicalgia: Secondary | ICD-10-CM | POA: Diagnosis not present

## 2021-03-27 DIAGNOSIS — M545 Low back pain, unspecified: Secondary | ICD-10-CM | POA: Diagnosis not present

## 2021-03-28 ENCOUNTER — Encounter (HOSPITAL_BASED_OUTPATIENT_CLINIC_OR_DEPARTMENT_OTHER): Payer: Self-pay | Admitting: Physical Therapy

## 2021-03-28 NOTE — Therapy (Signed)
[facility] [address] [place], [place], [address] Phone: [phone]   Fax:  [phone]  Physical Therapy Treatment  Patient Details  Name: Dawn [name] MRN: [id number] Date of Birth: [birth year] Referring Provider (PT): Dr Arlice Colt   Encounter Date: 03/27/2021   PT End of Session - 03/28/21 1935     Visit Number 3    Number of Visits 12    Date for PT Re-Evaluation 06/06/21    Authorization Type progress note on visit 10    PT Start Time 0930    PT Stop Time 1014    PT Time Calculation (min) 44 min    Activity Tolerance Patient tolerated treatment well    Behavior During Therapy [facility] for tasks assessed/performed             Past Medical History:  Diagnosis Date   Allergy    Dry eyes    Endometrial polyp    Essential hypertension 08/25/2018   GERD (gastroesophageal reflux disease)    History of hiatal hernia    History of kidney stones    Hot flashes, menopausal 10/13/2011   Estradiol started    Jaundice as teenager   no problems since   Memory loss 09/21/2019     1/2 of feet numb all the time   Multiple sclerosis ([place]) dx 2001   Neuropathy    Neuropathy    bilateral feet   Osteoarthritis    Sjogren's syndrome ([place]) dx oct 2021   sore muscvles, dry mouth and eyes   Stroke [facility])    Vertigo    Vision abnormalities     Past Surgical History:  Procedure Laterality Date   ABDOMINAL HYSTERECTOMY  2002   partial   colonscopy  2011   DILATATION & CURETTAGE/HYSTEROSCOPY WITH MYOSURE N/A 06/08/2020   Procedure: DILATATION & CURETTAGE/HYSTEROSCOPY/Polypectomy WITH MYOSURE;  Surgeon: Armandina Stammer, DO;  Location: [facility];  Service: Gynecology;  Laterality: N/A;   LUMBAR FUSION  2001   TOTAL HIP ARTHROPLASTY Right 01/17/2021   Procedure: TOTAL HIP ARTHROPLASTY ANTERIOR APPROACH;  Surgeon: Rod Can, MD;  [facility];  Service: Orthopedics;  Laterality: Right;   UPPER  GI ENDOSCOPY  yrs ago    There were no vitals filed for this visit.   Subjective Assessment - 03/28/21 1933     Subjective Patient continues to report high levels of low back pain with activity. She is doing more activity though. She had no significant pain after the last visit.    Pertinent History MS    How long can you stand comfortably? hurts as soon as she stands. Has to shift frequently    How long can you walk comfortably? limited community ambualtion    Diagnostic tests Nothing recent    Currently in Pain? Yes    Pain Score 6     Pain Location Back    Pain Orientation Right    Pain Descriptors / Indicators Aching    Pain Type Chronic pain    Pain Onset More than a month ago    Pain Frequency Constant    Aggravating Factors  standing, alking, bending    Pain Relieving Factors light exercises    Effect of Pain on Daily Activities difficutly walking and standing    Multiple Pain Sites No  [facility] Adult PT Treatment/Exercise - 03/28/21 0001       Lumbar Exercises: Stretches   Active Hamstring Stretch 3 reps;20 seconds    Lower Trunk Rotation Limitations x20      Lumbar Exercises: Seated   LAQ on Chair Weights (lbs) x20 red band    Other Seated Lumbar Exercises hamstring curl x20 red      Lumbar Exercises: Supine   Other Supine Lumbar Exercises supine march 3x10; supine clams shell 2x20 red      Manual Therapy   Soft tissue mobilization trigger point release to the upper gluteal and lumbar paraspinal. roller to bilateral gluteals    Manual Traction LAD Grade II-III left hip pain                     PT Education - 03/28/21 1935     Education Details HEP and symptom management    Person(s) Educated Patient    Methods Explanation;Demonstration;Tactile cues;Verbal cues    Comprehension Verbalized understanding;Returned demonstration;Verbal cues required;Tactile cues required              PT Short Term  Goals - 03/14/21 1352       PT SHORT TERM GOAL #1   Title Patient will report a 50% reduction in lower back pain    Time 4    Period Weeks    Status New    Target Date 01/17/21      PT SHORT TERM GOAL #2   Title Patient will increase distance on 6 min walk test by 200'    Time 4    Period Weeks    Status New    Target Date 04/11/21      PT SHORT TERM GOAL #3   Title Patient increase right hip flexion strength to 4+/5    Time 4    Period Weeks    Status New    Target Date 04/11/21               PT Long Term Goals - 03/14/21 1356       PT LONG TERM GOAL #1   Title Patient will return to [facility] with a full program for her hip    Time 12    Period Weeks    Status New    Target Date 06/06/21      PT LONG TERM GOAL #2   Title Patient will ambualte 1500' on the 6 min walk test in order to improve community ambualtion    Time 12    Period Weeks    Status New    Target Date 06/06/21      PT LONG TERM GOAL #3   Title Patient will stand for 30 min without a significant increase in pain in order to perfrom ADL's    Time 12    Period Weeks    Status New    Target Date 06/06/21                   Plan - 03/28/21 1936     Clinical Impression Statement Depsite Camila Li levels of lower back pain, patient as able to complete exercises without increased pain> She completed seated ther-ex as well as standing ther-ex. Therapy also focused on manual therapy to the lumbar spine.    Personal Factors and Comorbidities Comorbidity 1;Comorbidity 2    Comorbidities MS, OA    Examination-Activity Limitations Bed Mobility    Examination-Participation Restrictions Meal Prep;Cleaning;Community Activity;Laundry;Shop  Stability/Clinical Decision Making Stable/Uncomplicated    Clinical Decision Making Low    Rehab Potential Good    PT Frequency 1x / week    PT Duration 8 weeks    PT Treatment/Interventions ADLs/Self Care Home Management;Electrical  Stimulation;Cryotherapy;Iontophoresis '4mg'$ /ml Dexamethasone;Moist Heat;Traction;DME Instruction;Neuromuscular re-education;Patient/family education;Manual techniques;Passive range of motion;Taping;Therapeutic activities;Therapeutic exercise;Balance training;Gait training;Stair training;Functional mobility training;Dry needling;Ultrasound    PT Next Visit Plan prehab for hip replacement; continue with strengthening and manual therapy; consider pool therapy for prehab    PT Home Exercise Plan reviewed seated hamstring stretch and LTR. She has done those in the past.    Consulted and Agree with Plan of Care Patient             Patient will benefit from skilled therapeutic intervention in order to improve the following deficits and impairments:  Abnormal gait, Difficulty walking, Decreased range of motion, Decreased mobility, Decreased strength, Postural dysfunction, Pain, Increased muscle spasms  Visit Diagnosis: Chronic bilateral low back pain without sciatica  Muscle weakness (generalized)  Difficulty in walking, not elsewhere classified  Cervicalgia  Cramp and spasm     Problem List Patient Active Problem List   Diagnosis Date Noted   Osteoarthritis of right hip 01/17/2021   Status post THR (total hip replacement) 01/17/2021   Chronic bilateral low back pain without sciatica 10/02/2020   Chronic pain syndrome 06/20/2020   Post laminectomy syndrome 06/20/2020   Sjogren's disease ([place]) 05/23/2020   Primary osteoarthritis of both hands 05/23/2020   Primary osteoarthritis of both feet 05/23/2020   Sacroiliitis, not elsewhere classified ([place]) 12/22/2019   Other secondary scoliosis, lumbar region 12/22/2019   Spondylosis without myelopathy or radiculopathy, lumbar region 12/22/2019   Cervical radiculopathy 10/18/2019   Numbness 09/21/2019   Bilateral carpal tunnel syndrome 09/21/2019   High risk medication use 09/21/2019   Memory loss 09/21/2019   Essential hypertension  08/25/2018   Abnormal SPEP 02/19/2018   Hand pain 02/20/2017   Multiple joint pain 02/18/2017   Right elbow pain 12/09/2016   Disturbed cognition 06/25/2016   Trochanteric bursitis of left hip 02/18/2016   Sciatica, right side 10/30/2015   Bilateral arm pain 10/10/2015   Trochanteric bursitis of both hips 08/04/2014   Chronic fatigue 08/04/2014   Urinary frequency 08/04/2014   Abnormality of gait 08/04/2014   Unspecified visual disturbance 01/31/2013   Transient vision disturbance 01/31/2013   Routine general medical examination at a health care facility 11/04/2011   Colon polyps 11/03/2011   Hot flashes, menopausal 10/13/2011   POSTHERPETIC NEURALGIA 10/03/2009   DISPLCMT LUMBAR INTERVERT DISC W/O MYELOPATHY 09/05/2009   RENAL CALCULUS, RECURRENT 09/04/2009   Hyperlipidemia 08/31/2009   MULTIPLE SCLEROSIS, PROGRESSIVE/RELAPSING 08/31/2009   ALLERGIC RHINITIS 08/31/2009    Carney Living, PT 03/28/2021, 7:43 PM  [facility] [address] [place], [place], [address] Phone: [phone]   Fax:  [phone]  Name: Dawn [name] MRN: [id number] Date of Birth: 1946-02-18

## 2021-04-01 ENCOUNTER — Other Ambulatory Visit: Payer: Self-pay

## 2021-04-01 ENCOUNTER — Ambulatory Visit (HOSPITAL_BASED_OUTPATIENT_CLINIC_OR_DEPARTMENT_OTHER): Payer: Medicare Other | Admitting: Physical Therapy

## 2021-04-01 ENCOUNTER — Encounter (HOSPITAL_BASED_OUTPATIENT_CLINIC_OR_DEPARTMENT_OTHER): Payer: Self-pay | Admitting: Physical Therapy

## 2021-04-01 DIAGNOSIS — M6281 Muscle weakness (generalized): Secondary | ICD-10-CM | POA: Diagnosis not present

## 2021-04-01 DIAGNOSIS — R262 Difficulty in walking, not elsewhere classified: Secondary | ICD-10-CM

## 2021-04-01 DIAGNOSIS — G8929 Other chronic pain: Secondary | ICD-10-CM | POA: Diagnosis not present

## 2021-04-01 DIAGNOSIS — R252 Cramp and spasm: Secondary | ICD-10-CM | POA: Diagnosis not present

## 2021-04-01 DIAGNOSIS — M545 Low back pain, unspecified: Secondary | ICD-10-CM | POA: Diagnosis not present

## 2021-04-01 DIAGNOSIS — M542 Cervicalgia: Secondary | ICD-10-CM | POA: Diagnosis not present

## 2021-04-01 NOTE — Therapy (Signed)
[facility] [address] [place], [place], [address] Phone: [phone]   Fax:  [phone]  Physical Therapy Treatment  Patient Details  Name: Dawn [name] MRN: [id number] Date of Birth: Nov 09, 1945 Referring Provider (PT): Dr Arlice Colt   Encounter Date: 04/01/2021   PT End of Session - 04/01/21 1142     Visit Number 4    Number of Visits 12    Date for PT Re-Evaluation 06/06/21    Authorization Type progress note on visit 10    PT Start Time 1100    PT Stop Time 1143    PT Time Calculation (min) 43 min    Activity Tolerance Patient tolerated treatment well    Behavior During Therapy [facility] for tasks assessed/performed             Past Medical History:  Diagnosis Date   Allergy    Dry eyes    Endometrial polyp    Essential hypertension 08/25/2018   GERD (gastroesophageal reflux disease)    History of hiatal hernia    History of kidney stones    Hot flashes, menopausal 10/13/2011   Estradiol started    Jaundice as teenager   no problems since   Memory loss 09/21/2019     1/2 of feet numb all the time   Multiple sclerosis ([place]) dx 2001   Neuropathy    Neuropathy    bilateral feet   Osteoarthritis    Sjogren's syndrome ([place]) dx oct 2021   sore muscvles, dry mouth and eyes   Stroke [facility])    Vertigo    Vision abnormalities     Past Surgical History:  Procedure Laterality Date   ABDOMINAL HYSTERECTOMY  2002   partial   colonscopy  2011   DILATATION & CURETTAGE/HYSTEROSCOPY WITH MYOSURE N/A 06/08/2020   Procedure: DILATATION & CURETTAGE/HYSTEROSCOPY/Polypectomy WITH MYOSURE;  Surgeon: Armandina Stammer, DO;  Location: [facility];  Service: Gynecology;  Laterality: N/A;   LUMBAR FUSION  2001   TOTAL HIP ARTHROPLASTY Right 01/17/2021   Procedure: TOTAL HIP ARTHROPLASTY ANTERIOR APPROACH;  Surgeon: Rod Can, MD;  Location: [facility];  Service: Orthopedics;  Laterality: Right;   UPPER  GI ENDOSCOPY  yrs ago    There were no vitals filed for this visit.   Subjective Assessment - 04/01/21 1106     Subjective Patient reports left hip pain. She has stiffness in the morning. Her exercises help. She hashad increased back pain as her activity has increased following surgery.    Pertinent History MS    How long can you stand comfortably? hurts as soon as she stands. Has to shift frequently    How long can you walk comfortably? limited community ambualtion    Diagnostic tests Nothing recent    Patient Stated Goals to go home    Currently in Pain? Yes    Pain Score 6     Pain Location Back    Pain Orientation Left    Pain Descriptors / Indicators Aching    Pain Radiating Towards into the left hip    Pain Onset More than a month ago    Pain Frequency Constant    Aggravating Factors  standing, walking, bending    Pain Relieving Factors light exercises    Effect of Pain on Daily Activities difficulty walking and standing    Multiple Pain Sites No  [place] Adult PT Treatment/Exercise - 04/01/21 0001       Lumbar Exercises: Stretches   Active Hamstring Stretch 3 reps;20 seconds    Lower Trunk Rotation Limitations x20      Lumbar Exercises: Seated   LAQ on Chair Weights (lbs) x20 red band    Other Seated Lumbar Exercises hamstring curl x20 red    Other Seated Lumbar Exercises seated clamshell with band 3x10 red; bilateral er 2x10; wand flwxion 1 lb 2x10; horizontal abducton 2x10 red      Manual Therapy   Soft tissue mobilization trigger point release to the upper gluteal and lumbar paraspinal. roller to bilateral gluteals in [place]    Manual Traction LAD Grade II-III left hip pain                     PT Education - 04/01/21 1141     Education Details Reviewed HEP and symptom mangement    Person(s) Educated Patient    Methods Explanation;Demonstration;Tactile cues;Verbal cues    Comprehension Verbalized  understanding;Returned demonstration;Verbal cues required              PT Short Term Goals - 03/14/21 1352       PT SHORT TERM GOAL #1   Title Patient will report a 50% reduction in lower back pain    Time 4    Period Weeks    Status New    Target Date 01/17/21      PT SHORT TERM GOAL #2   Title Patient will increase distance on 6 min walk test by 200'    Time 4    Period Weeks    Status New    Target Date 04/11/21      PT SHORT TERM GOAL #3   Title Patient increase right hip flexion strength to 4+/5    Time 4    Period Weeks    Status New    Target Date 04/11/21               PT Long Term Goals - 03/14/21 1356       PT LONG TERM GOAL #1   Title Patient will return to [facility] with a full program for her hip    Time 12    Period Weeks    Status New    Target Date 06/06/21      PT LONG TERM GOAL #2   Title Patient will ambualte 1500' on the 6 min walk test in order to improve community ambualtion    Time 12    Period Weeks    Status New    Target Date 06/06/21      PT LONG TERM GOAL #3   Title Patient will stand for 30 min without a significant increase in pain in order to perfrom ADL's    Time 12    Period Weeks    Status New    Target Date 06/06/21                   Plan - 04/01/21 1143     Clinical Impression Statement Patient had significant tihgtness and spasming in her low back and left hip. Therapy focused on manual therapy to decrease spasming Therapy reviewed UE postural and core strengthening as well as light LE strengthening. She reports she is going to keep working on the exercises. She may also contact her MD who deos injections.    Personal Factors and Comorbidities Comorbidity 1;Comorbidity 2  Comorbidities MS, OA    Examination-Activity Limitations Bed Mobility    Examination-Participation Restrictions Meal Prep;Cleaning;Community Activity;Laundry;Shop    Stability/Clinical Decision Making Stable/Uncomplicated     Clinical Decision Making Low    Rehab Potential Good    PT Frequency 1x / week    PT Duration 8 weeks    PT Treatment/Interventions ADLs/Self Care Home Management;Electrical Stimulation;Cryotherapy;Iontophoresis '4mg'$ /ml Dexamethasone;Moist Heat;Traction;DME Instruction;Neuromuscular re-education;Patient/family education;Manual techniques;Passive range of motion;Taping;Therapeutic activities;Therapeutic exercise;Balance training;Gait training;Stair training;Functional mobility training;Dry needling;Ultrasound    PT Next Visit Plan prehab for hip replacement; continue with strengthening and manual therapy; consider pool therapy for prehab    PT Home Exercise Plan reviewed seated hamstring stretch and LTR. She has done those in the past.    Consulted and Agree with Plan of Care Patient             Patient will benefit from skilled therapeutic intervention in order to improve the following deficits and impairments:  Abnormal gait, Difficulty walking, Decreased range of motion, Decreased mobility, Decreased strength, Postural dysfunction, Pain, Increased muscle spasms  Visit Diagnosis: Chronic bilateral low back pain without sciatica  Muscle weakness (generalized)  Difficulty in walking, not elsewhere classified  Cervicalgia  Cramp and spasm     Problem List Patient Active Problem List   Diagnosis Date Noted   Osteoarthritis of right hip 01/17/2021   Status post THR (total hip replacement) 01/17/2021   Chronic bilateral low back pain without sciatica 10/02/2020   Chronic pain syndrome 06/20/2020   Post laminectomy syndrome 06/20/2020   Sjogren's disease ([place]) 05/23/2020   Primary osteoarthritis of both hands 05/23/2020   Primary osteoarthritis of both feet 05/23/2020   Sacroiliitis, not elsewhere classified ([place]) 12/22/2019   Other secondary scoliosis, lumbar region 12/22/2019   Spondylosis without myelopathy or radiculopathy, lumbar region 12/22/2019   Cervical radiculopathy  10/18/2019   Numbness 09/21/2019   Bilateral carpal tunnel syndrome 09/21/2019   High risk medication use 09/21/2019   Memory loss 09/21/2019   Essential hypertension 08/25/2018   Abnormal SPEP 02/19/2018   Hand pain 02/20/2017   Multiple joint pain 02/18/2017   Right elbow pain 12/09/2016   Disturbed cognition 06/25/2016   Trochanteric bursitis of left hip 02/18/2016   Sciatica, right side 10/30/2015   Bilateral arm pain 10/10/2015   Trochanteric bursitis of both hips 08/04/2014   Chronic fatigue 08/04/2014   Urinary frequency 08/04/2014   Abnormality of gait 08/04/2014   Unspecified visual disturbance 01/31/2013   Transient vision disturbance 01/31/2013   Routine general medical examination at a health care facility 11/04/2011   Colon polyps 11/03/2011   Hot flashes, menopausal 10/13/2011   POSTHERPETIC NEURALGIA 10/03/2009   DISPLCMT LUMBAR INTERVERT DISC W/O MYELOPATHY 09/05/2009   RENAL CALCULUS, RECURRENT 09/04/2009   Hyperlipidemia 08/31/2009   MULTIPLE SCLEROSIS, PROGRESSIVE/RELAPSING 08/31/2009   ALLERGIC RHINITIS 08/31/2009    Carney Living, PT 04/01/2021, 12:08 PM  [facility] [address] [place], [place], [address] Phone: [phone]   Fax:  [phone]  Name: GRIER WINSTEAD MRN: [id number] Date of Birth: [birth year]

## 2021-04-07 ENCOUNTER — Other Ambulatory Visit: Payer: Self-pay | Admitting: Neurology

## 2021-04-08 ENCOUNTER — Encounter (HOSPITAL_BASED_OUTPATIENT_CLINIC_OR_DEPARTMENT_OTHER): Payer: Self-pay | Admitting: Physical Therapy

## 2021-04-08 ENCOUNTER — Other Ambulatory Visit: Payer: Self-pay

## 2021-04-08 ENCOUNTER — Ambulatory Visit (HOSPITAL_BASED_OUTPATIENT_CLINIC_OR_DEPARTMENT_OTHER): Payer: Medicare Other | Admitting: Physical Therapy

## 2021-04-08 DIAGNOSIS — M6281 Muscle weakness (generalized): Secondary | ICD-10-CM | POA: Diagnosis not present

## 2021-04-08 DIAGNOSIS — M545 Low back pain, unspecified: Secondary | ICD-10-CM | POA: Diagnosis not present

## 2021-04-08 DIAGNOSIS — G8929 Other chronic pain: Secondary | ICD-10-CM | POA: Diagnosis not present

## 2021-04-08 DIAGNOSIS — R262 Difficulty in walking, not elsewhere classified: Secondary | ICD-10-CM | POA: Diagnosis not present

## 2021-04-08 DIAGNOSIS — R252 Cramp and spasm: Secondary | ICD-10-CM | POA: Diagnosis not present

## 2021-04-08 DIAGNOSIS — M542 Cervicalgia: Secondary | ICD-10-CM

## 2021-04-08 NOTE — Telephone Encounter (Signed)
Pt is due for a refill on clonazepam. Pt is up to date on her appts. [place] Controlled Substance Registry checked and is appropriate.

## 2021-04-08 NOTE — Therapy (Signed)
[facility] [address] [place], [place], [address] Phone: [phone]   Fax:  [phone]  Physical Therapy Treatment  Patient Details  Name: Dawn [name] MRN: [id number] Date of Birth: 10-30-1945 Referring Provider (PT): Dr Arlice Colt   Encounter Date: 04/08/2021   PT End of Session - 04/08/21 1200     Visit Number 5    Number of Visits 12    Date for PT Re-Evaluation 06/06/21    Authorization Type progress note on visit 10    PT Start Time 1100    PT Stop Time 1142    PT Time Calculation (min) 42 min    Activity Tolerance Patient tolerated treatment well    Behavior During Therapy [facility] for tasks assessed/performed             Past Medical History:  Diagnosis Date   Allergy    Dry eyes    Endometrial polyp    Essential hypertension 08/25/2018   GERD (gastroesophageal reflux disease)    History of hiatal hernia    History of kidney stones    Hot flashes, menopausal 10/13/2011   Estradiol started    Jaundice as teenager   no problems since   Memory loss 09/21/2019     1/2 of feet numb all the time   Multiple sclerosis ([place]) dx 2001   Neuropathy    Neuropathy    bilateral feet   Osteoarthritis    Sjogren's syndrome ([place]) dx oct 2021   sore muscvles, dry mouth and eyes   Stroke [facility])    Vertigo    Vision abnormalities     Past Surgical History:  Procedure Laterality Date   ABDOMINAL HYSTERECTOMY  2002   partial   colonscopy  2011   DILATATION & CURETTAGE/HYSTEROSCOPY WITH MYOSURE N/A 06/08/2020   Procedure: DILATATION & CURETTAGE/HYSTEROSCOPY/Polypectomy WITH MYOSURE;  Surgeon: Armandina Stammer, DO;  Location: [facility];  Service: Gynecology;  Laterality: N/A;   LUMBAR FUSION  2001   TOTAL HIP ARTHROPLASTY Right 01/17/2021   Procedure: TOTAL HIP ARTHROPLASTY ANTERIOR APPROACH;  Surgeon: Rod Can, MD;  [facility];  Service: Orthopedics;  Laterality: Right;   UPPER  GI ENDOSCOPY  yrs ago    There were no vitals filed for this visit.   Subjective Assessment - 04/08/21 1156     Subjective Patient reports her back felt better after the last visit.. Today she is having pain in the middle of her lower back. She is looking forward to getting back to exercsies in the pool.    How long can you stand comfortably? hurts as soon as she stands. Has to shift frequently    How long can you walk comfortably? limited community ambualtion    Diagnostic tests Nothing recent    Patient Stated Goals to go home    Currently in Pain? Yes    Pain Score 5     Pain Location Back    Pain Orientation Left    Pain Descriptors / Indicators Aching    Pain Type Chronic pain    Pain Onset More than a month ago    Pain Frequency Constant    Aggravating Factors  standing, walking, bending    Pain Relieving Factors light exercises    Effect of Pain on Daily Activities diffciulty walking and standing    Multiple Pain Sites No  Pt seen for aquatic therapy today.  Treatment took place in water 3.25-4 ft in depth at the [facility] pool. Temp of water was 91.  Pt entered/exited the pool via stairs (step through pattern) independently with bilat rail.  Introduction to water. Had patient stand at different levels so she could feel the bouncy   Warm up: heel/toe walking x4 laps across pool chest deep side stepping x4 laps from shallow to deep;  Ambulation with noodle support: long strides, march  Exercises; Slow march x20; squats x20; hip extension x20; hip abduction x20; Sit to stand x20;  all with 2lb weights   board trunk flexion x10 lateral board rotation x10 each way seated   With neck noddle and foot float; push pull x20; side to side perturbations from the feet x20; press and push x20  Steps step up x20 each leg; lateral step up x20 each leg;   Seated LAQ and March x20 2lbs   Noodle push and pull x20 with core breathing;  noodle press x20 with core breathing    Pt requires buoyancy for support and to offload joints with strengthening exercises. Viscosity of the water is needed for resistance of strengthening; water current perturbations provides challenge to standing balance unsupported, requiring increased core activation.               PT Education - 04/08/21 1159     Education Details reviewed pool exercsies    Person(s) Educated Patient    Methods Explanation;Demonstration;Tactile cues;Verbal cues    Comprehension Verbalized understanding;Returned demonstration;Verbal cues required;Tactile cues required              PT Short Term Goals - 03/14/21 1352       PT SHORT TERM GOAL #1   Title Patient will report a 50% reduction in lower back pain    Time 4    Period Weeks    Status New    Target Date 01/17/21      PT SHORT TERM GOAL #2   Title Patient will increase distance on 6 min walk test by 200'    Time 4    Period Weeks    Status New    Target Date 04/11/21      PT SHORT TERM GOAL #3   Title Patient increase right hip flexion strength to 4+/5    Time 4    Period Weeks    Status New    Target Date 04/11/21               PT Long Term Goals - 03/14/21 1356       PT LONG TERM GOAL #1   Title Patient will return to [facility] with a full program for her hip    Time 12    Period Weeks    Status New    Target Date 06/06/21      PT LONG TERM GOAL #2   Title Patient will ambualte 1500' on the 6 min walk test in order to improve community ambualtion    Time 12    Period Weeks    Status New    Target Date 06/06/21      PT LONG TERM GOAL #3   Title Patient will stand for 30 min without a significant increase in pain in order to perfrom ADL's    Time 12    Period Weeks    Status New    Target Date 06/06/21  Plan - 04/08/21 1200     Clinical Impression Statement Patient tolerated treatment well. She she reported improved balance  with treatment. Therapy focused on core and hip strengthening. She felt comfortable in the pool and will likley start back at the [place] [facility] center. She was able to move her hip further out to the side and back then she has been able to on land.    Personal Factors and Comorbidities Comorbidity 1;Comorbidity 2    Examination-Activity Limitations Bed Mobility    Examination-Participation Restrictions Meal Prep;Cleaning;Community Activity;Laundry;Shop    Stability/Clinical Decision Making Stable/Uncomplicated    Clinical Decision Making Low    Rehab Potential Good    PT Frequency 1x / week    PT Duration 8 weeks    PT Treatment/Interventions ADLs/Self Care Home Management;Electrical Stimulation;Cryotherapy;Iontophoresis 4mg /ml Dexamethasone;Moist Heat;Traction;DME Instruction;Neuromuscular re-education;Patient/family education;Manual techniques;Passive range of motion;Taping;Therapeutic activities;Therapeutic exercise;Balance training;Gait training;Stair training;Functional mobility training;Dry needling;Ultrasound    PT Next Visit Plan continue with pool and land strengthening, manual therapy to back    PT Home Exercise Plan reviewed seated hamstring stretch and LTR. She has done those in the past.    Consulted and Agree with Plan of Care Patient             Patient will benefit from skilled therapeutic intervention in order to improve the following deficits and impairments:  Abnormal gait, Difficulty walking, Decreased range of motion, Decreased mobility, Decreased strength, Postural dysfunction, Pain, Increased muscle spasms  Visit Diagnosis: Chronic bilateral low back pain without sciatica  Muscle weakness (generalized)  Difficulty in walking, not elsewhere classified  Cervicalgia  Cramp and spasm     Problem List Patient Active Problem List   Diagnosis Date Noted   Osteoarthritis of right hip 01/17/2021   Status post THR (total hip replacement) 01/17/2021   Chronic bilateral  low back pain without sciatica 10/02/2020   Chronic pain syndrome 06/20/2020   Post laminectomy syndrome 06/20/2020   Sjogren's disease ([place]) 05/23/2020   Primary osteoarthritis of both hands 05/23/2020   Primary osteoarthritis of both feet 05/23/2020   Sacroiliitis, not elsewhere classified ([place]) 12/22/2019   Other secondary scoliosis, lumbar region 12/22/2019   Spondylosis without myelopathy or radiculopathy, lumbar region 12/22/2019   Cervical radiculopathy 10/18/2019   Numbness 09/21/2019   Bilateral carpal tunnel syndrome 09/21/2019   High risk medication use 09/21/2019   Memory loss 09/21/2019   Essential hypertension 08/25/2018   Abnormal SPEP 02/19/2018   Hand pain 02/20/2017   Multiple joint pain 02/18/2017   Right elbow pain 12/09/2016   Disturbed cognition 06/25/2016   Trochanteric bursitis of left hip 02/18/2016   Sciatica, right side 10/30/2015   Bilateral arm pain 10/10/2015   Trochanteric bursitis of both hips 08/04/2014   Chronic fatigue 08/04/2014   Urinary frequency 08/04/2014   Abnormality of gait 08/04/2014   Unspecified visual disturbance 01/31/2013   Transient vision disturbance 01/31/2013   Routine general medical examination at a health care facility 11/04/2011   Colon polyps 11/03/2011   Hot flashes, menopausal 10/13/2011   POSTHERPETIC NEURALGIA 10/03/2009   DISPLCMT LUMBAR INTERVERT DISC W/O MYELOPATHY 09/05/2009   RENAL CALCULUS, RECURRENT 09/04/2009   Hyperlipidemia 08/31/2009   MULTIPLE SCLEROSIS, PROGRESSIVE/RELAPSING 08/31/2009   ALLERGIC RHINITIS 08/31/2009    Carney Living, PT DPT  04/08/2021, 12:06 PM  [facility] [address] [place], [place], [address] Phone: [phone]   Fax:  [phone]  Name: RIONNA FELTES MRN: [id number] Date of Birth: 1945/08/31

## 2021-04-09 ENCOUNTER — Other Ambulatory Visit: Payer: Medicare Other

## 2021-04-11 ENCOUNTER — Ambulatory Visit: Payer: Medicare Other | Admitting: Neurology

## 2021-04-12 ENCOUNTER — Ambulatory Visit: Payer: Medicare Other | Admitting: Family

## 2021-04-15 ENCOUNTER — Encounter (HOSPITAL_BASED_OUTPATIENT_CLINIC_OR_DEPARTMENT_OTHER): Payer: Self-pay | Admitting: Physical Therapy

## 2021-04-15 ENCOUNTER — Ambulatory Visit (HOSPITAL_BASED_OUTPATIENT_CLINIC_OR_DEPARTMENT_OTHER): Payer: Medicare Other | Admitting: Physical Therapy

## 2021-04-15 ENCOUNTER — Ambulatory Visit
Admission: RE | Admit: 2021-04-15 | Discharge: 2021-04-15 | Disposition: A | Discharge status: Home / Self Care | Payer: Medicare Other | Source: Ambulatory Visit | Attending: Obstetrics and Gynecology | Admitting: Obstetrics and Gynecology

## 2021-04-15 ENCOUNTER — Other Ambulatory Visit: Payer: Self-pay

## 2021-04-15 DIAGNOSIS — G8929 Other chronic pain: Secondary | ICD-10-CM | POA: Diagnosis not present

## 2021-04-15 DIAGNOSIS — M6281 Muscle weakness (generalized): Secondary | ICD-10-CM

## 2021-04-15 DIAGNOSIS — R252 Cramp and spasm: Secondary | ICD-10-CM | POA: Diagnosis not present

## 2021-04-15 DIAGNOSIS — Z1231 Encounter for screening mammogram for malignant neoplasm of breast: Secondary | ICD-10-CM

## 2021-04-15 DIAGNOSIS — R262 Difficulty in walking, not elsewhere classified: Secondary | ICD-10-CM | POA: Diagnosis not present

## 2021-04-15 DIAGNOSIS — M545 Low back pain, unspecified: Secondary | ICD-10-CM | POA: Diagnosis not present

## 2021-04-15 DIAGNOSIS — M542 Cervicalgia: Secondary | ICD-10-CM

## 2021-04-15 NOTE — Therapy (Signed)
[facility] [address] [place], [place], [address] Phone: [phone]   Fax:  [phone]  Physical Therapy Treatment  Patient Details  Name: Dawn [name] MRN: [id number] Date of Birth: 1946-03-24 Referring Provider (PT): Dr Arlice Colt   Encounter Date: 04/15/2021   PT End of Session - 04/15/21 1334     Visit Number 6    Number of Visits 12    Date for PT Re-Evaluation 06/06/21    Authorization Type progress note on visit 10    PT Start Time 1100    PT Stop Time 1143    PT Time Calculation (min) 43 min    Activity Tolerance Patient tolerated treatment well    Behavior During Therapy [facility] for tasks assessed/performed             Past Medical History:  Diagnosis Date   Allergy    Dry eyes    Endometrial polyp    Essential hypertension 08/25/2018   GERD (gastroesophageal reflux disease)    History of hiatal hernia    History of kidney stones    Hot flashes, menopausal 10/13/2011   Estradiol started    Jaundice as teenager   no problems since   Memory loss 09/21/2019     1/2 of feet numb all the time   Multiple sclerosis ([place]) dx 2001   Neuropathy    Neuropathy    bilateral feet   Osteoarthritis    Sjogren's syndrome ([place]) dx oct 2021   sore muscvles, dry mouth and eyes   Stroke [facility])    Vertigo    Vision abnormalities     Past Surgical History:  Procedure Laterality Date   ABDOMINAL HYSTERECTOMY  2002   partial   colonscopy  2011   DILATATION & CURETTAGE/HYSTEROSCOPY WITH MYOSURE N/A 06/08/2020   Procedure: DILATATION & CURETTAGE/HYSTEROSCOPY/Polypectomy WITH MYOSURE;  Surgeon: Armandina Stammer, DO;  Location: [place];  Service: Gynecology;  Laterality: N/A;   LUMBAR FUSION  2001   TOTAL HIP ARTHROPLASTY Right 01/17/2021   Procedure: TOTAL HIP ARTHROPLASTY ANTERIOR APPROACH;  Surgeon: Rod Can, MD;  Location: [facility];  Service: Orthopedics;  Laterality: Right;   UPPER  GI ENDOSCOPY  yrs ago    There were no vitals filed for this visit.   Subjective Assessment - 04/15/21 1300     Subjective Patient reports her back is feeling a little better but she is havingpain in both of her lateral hips.    Pertinent History MS    How long can you stand comfortably? hurts as soon as she stands. Has to shift frequently    How long can you walk comfortably? limited community ambualtion    Diagnostic tests Nothing recent    Patient Stated Goals to go home    Currently in Pain? Yes    Pain Score 5     Pain Location Hip    Pain Orientation Right;Left    Pain Descriptors / Indicators Aching    Pain Type Chronic pain    Pain Onset More than a month ago    Pain Frequency Constant    Aggravating Factors  standing and walking    Pain Relieving Factors light exercises    Effect of Pain on Daily Activities difficulty walking and standing    Multiple Pain Sites No                        Warm up: heel/toe walking  x4 laps across pool chest deep side stepping x4 laps from shallow to deep;  Ambulation with noodle support: long strides, march  Exercises; Slow march x20; squats x20; hip extension x20; hip abduction x20; Sit to stand x20;  all with 2lb weights    board trunk flexion x10 lateral board rotation x10 each way seated    With neck noddle and foot float; push pull x20; side to side perturbations from the feet x20; press and push x20   Steps step up x20 each leg; lateral step up x20 each leg;    Seated LAQ and March x20 2lbs    Noodle push and pull x20 with core breathing; noodle press x20 with core breathing      Pt requires buoyancy for support and to offload joints with strengthening exercises. Viscosity of the water is needed for resistance of strengthening; water current perturbations provides challenge to standing balance unsupported, requiring increased core activation.                  PT Education - 04/15/21 2123      Education Details benefits of pool exercises    Person(s) Educated Patient    Methods Explanation;Demonstration;Tactile cues;Verbal cues    Comprehension Verbalized understanding;Returned demonstration;Verbal cues required;Tactile cues required              PT Short Term Goals - 03/14/21 1352       PT SHORT TERM GOAL #1   Title Patient will report a 50% reduction in lower back pain    Time 4    Period Weeks    Status New    Target Date 01/17/21      PT SHORT TERM GOAL #2   Title Patient will increase distance on 6 min walk test by 200'    Time 4    Period Weeks    Status New    Target Date 04/11/21      PT SHORT TERM GOAL #3   Title Patient increase right hip flexion strength to 4+/5    Time 4    Period Weeks    Status New    Target Date 04/11/21               PT Long Term Goals - 03/14/21 1356       PT LONG TERM GOAL #1   Title Patient will return to [facility] with a full program for her hip    Time 12    Period Weeks    Status New    Target Date 06/06/21      PT LONG TERM GOAL #2   Title Patient will ambualte 1500' on the 6 min walk test in order to improve community ambualtion    Time 12    Period Weeks    Status New    Target Date 06/06/21      PT LONG TERM GOAL #3   Title Patient will stand for 30 min without a significant increase in pain in order to perfrom ADL's    Time 12    Period Weeks    Status New    Target Date 06/06/21                   Plan - 04/15/21 1336     Clinical Impression Statement Patient continues to tolerate pool exercises well. She had no increase in pain with exercises. Therapy focused on walking with proper gait and weight shifting. She was able to perfrom  weighted hip exercises and stair exercises    Personal Factors and Comorbidities Comorbidity 1;Comorbidity 2    Comorbidities MS, OA    Examination-Activity Limitations Bed Mobility    Examination-Participation Restrictions Meal  Prep;Cleaning;Community Activity;Laundry;Shop    Stability/Clinical Decision Making Stable/Uncomplicated    Clinical Decision Making Low    Rehab Potential Good    PT Frequency 1x / week    PT Duration 8 weeks    PT Treatment/Interventions ADLs/Self Care Home Management;Electrical Stimulation;Cryotherapy;Iontophoresis 4mg /ml Dexamethasone;Moist Heat;Traction;DME Instruction;Neuromuscular re-education;Patient/family education;Manual techniques;Passive range of motion;Taping;Therapeutic activities;Therapeutic exercise;Balance training;Gait training;Stair training;Functional mobility training;Dry needling;Ultrasound    PT Next Visit Plan continue with pool and land strengthening, manual therapy to back    PT Home Exercise Plan reviewed seated hamstring stretch and LTR. She has done those in the past.    Consulted and Agree with Plan of Care Patient             Patient will benefit from skilled therapeutic intervention in order to improve the following deficits and impairments:  Abnormal gait, Difficulty walking, Decreased range of motion, Decreased mobility, Decreased strength, Postural dysfunction, Pain, Increased muscle spasms  Visit Diagnosis: Chronic bilateral low back pain without sciatica  Muscle weakness (generalized)  Difficulty in walking, not elsewhere classified  Cervicalgia  Cramp and spasm     Problem List Patient Active Problem List   Diagnosis Date Noted   Osteoarthritis of right hip 01/17/2021   Status post THR (total hip replacement) 01/17/2021   Chronic bilateral low back pain without sciatica 10/02/2020   Chronic pain syndrome 06/20/2020   Post laminectomy syndrome 06/20/2020   Sjogren's disease ([place]) 05/23/2020   Primary osteoarthritis of both hands 05/23/2020   Primary osteoarthritis of both feet 05/23/2020   Sacroiliitis, not elsewhere classified ([place]) 12/22/2019   Other secondary scoliosis, lumbar region 12/22/2019   Spondylosis without myelopathy or  radiculopathy, lumbar region 12/22/2019   Cervical radiculopathy 10/18/2019   Numbness 09/21/2019   Bilateral carpal tunnel syndrome 09/21/2019   High risk medication use 09/21/2019   Memory loss 09/21/2019   Essential hypertension 08/25/2018   Abnormal SPEP 02/19/2018   Hand pain 02/20/2017   Multiple joint pain 02/18/2017   Right elbow pain 12/09/2016   Disturbed cognition 06/25/2016   Trochanteric bursitis of left hip 02/18/2016   Sciatica, right side 10/30/2015   Bilateral arm pain 10/10/2015   Trochanteric bursitis of both hips 08/04/2014   Chronic fatigue 08/04/2014   Urinary frequency 08/04/2014   Abnormality of gait 08/04/2014   Unspecified visual disturbance 01/31/2013   Transient vision disturbance 01/31/2013   Routine general medical examination at a health care facility 11/04/2011   Colon polyps 11/03/2011   Hot flashes, menopausal 10/13/2011   POSTHERPETIC NEURALGIA 10/03/2009   DISPLCMT LUMBAR INTERVERT DISC W/O MYELOPATHY 09/05/2009   RENAL CALCULUS, RECURRENT 09/04/2009   Hyperlipidemia 08/31/2009   MULTIPLE SCLEROSIS, PROGRESSIVE/RELAPSING 08/31/2009   ALLERGIC RHINITIS 08/31/2009    Carney Living, PT 04/15/2021, 9:26 PM  [facility] [address] [place], [place], [address] Phone: [phone]   Fax:  [phone]  Name: NALANI ANDREEN MRN: [id number] Date of Birth: 24-Nov-1945

## 2021-04-16 ENCOUNTER — Other Ambulatory Visit: Payer: Medicare Other

## 2021-04-16 DIAGNOSIS — E7800 Pure hypercholesterolemia, unspecified: Secondary | ICD-10-CM | POA: Diagnosis not present

## 2021-04-17 LAB — LIPID PANEL
Cholesterol: 211 mg/dL — ABNORMAL HIGH (ref <200)
HDL: 48 mg/dL — ABNORMAL LOW (ref >=50)
LDL Cholesterol (Calc): 129 mg/dL (calc) — ABNORMAL HIGH
Non-HDL Cholesterol (Calc): 163 mg/dL (calc) — ABNORMAL HIGH (ref <130)
Total CHOL/HDL Ratio: 4.4 (calc) (ref <5.0)
Triglycerides: 196 mg/dL — ABNORMAL HIGH (ref <150)

## 2021-04-19 ENCOUNTER — Ambulatory Visit (INDEPENDENT_AMBULATORY_CARE_PROVIDER_SITE_OTHER): Payer: Medicare Other | Admitting: Family

## 2021-04-19 ENCOUNTER — Encounter: Payer: Self-pay | Admitting: Family

## 2021-04-19 ENCOUNTER — Other Ambulatory Visit: Payer: Self-pay

## 2021-04-19 VITALS — BP 120/68 | HR 81 | Temp 97.1°F | Resp 16 | Ht 62.0 in | Wt 182.0 lb

## 2021-04-19 DIAGNOSIS — M545 Low back pain, unspecified: Secondary | ICD-10-CM

## 2021-04-19 DIAGNOSIS — G35 Multiple sclerosis: Secondary | ICD-10-CM | POA: Diagnosis not present

## 2021-04-19 DIAGNOSIS — G8929 Other chronic pain: Secondary | ICD-10-CM

## 2021-04-19 DIAGNOSIS — M3509 Sicca syndrome with other organ involvement: Secondary | ICD-10-CM

## 2021-04-19 DIAGNOSIS — Z23 Encounter for immunization: Secondary | ICD-10-CM

## 2021-04-19 DIAGNOSIS — I10 Essential (primary) hypertension: Secondary | ICD-10-CM | POA: Diagnosis not present

## 2021-04-19 DIAGNOSIS — E782 Mixed hyperlipidemia: Secondary | ICD-10-CM | POA: Diagnosis not present

## 2021-04-19 DIAGNOSIS — Z789 Other specified health status: Secondary | ICD-10-CM | POA: Insufficient documentation

## 2021-04-19 MED ORDER — TETANUS-DIPHTH-ACELL PERTUSSIS 5-2.5-18.5 LF-MCG/0.5 IM SUSP: 0.5000 mL | Freq: Once | INTRAMUSCULAR | 0 refills | Status: AC

## 2021-04-19 NOTE — Patient Instructions (Signed)

## 2021-04-19 NOTE — Progress Notes (Signed)
Provider: Marlowe Sax FNP-C   Kenric Ginger, [name], NP  Patient Care Team: Jermeka Schlotterbeck, [name], NP as PCP - General (Family Medicine) [name], MD as Consulting Physician (Orthopedic Surgery) [name], [name], MD (Neurology) [name], [name], [place] (Optometry) [name], NP as Nurse Practitioner (Obstetrics and Gynecology) [name], MD as Consulting Physician (Dermatology)  Extended Emergency Contact Information Primary Emergency Contact: Cephus Richer) Home Phone: [phone] Mobile Phone: [phone] Relation: Relative Secondary Emergency Contact: [facility] Phone: [phone] Mobile Phone: [phone] Relation: Friend  Code Status:  Full Code  Goals of care: Advanced Directive information Advanced Directives 04/01/2021  Does Patient Have a Medical Advance Directive? No  Type of Advance Directive -  Does patient want to make changes to medical advance directive? -  Copy of [place] in Chart? -  Would patient like information on creating a medical advance directive? No - Patient declined     No chief complaint on file.   HPI:  Pt is a 75 y.o. female seen today for medical management of chronic diseases.Has a medical history of  Hypertension,hyperlipidemia,Seasonal allergies,Multiple sclerosis,Neuropathy,Osteoarthritis,GERD,Dry eyes among other conditions.  Doing physical therapy due to chronic lower back pain.states has up coming appointment with Orthopedic for evaluation.   Latest lipid panel  Total cholesterol 211,TRG 196,and LDL 129.states had issues with Statin in the past.currently on Zetia but states has had some muscle aches and diarrhea which she thinks it's related to Zetia and not her MS.No recent episode.   Discussed holding Zetia for few days to see if symptoms will resolve it not to restart Zetia.  Due for influenza,Tetanus,shingrix vaccine and 3rd COVID-19 booster vaccine. She will get her influenza  vaccine today.denies any s/Sx of URI.   Past Medical History:  Diagnosis Date   Allergy    Dry eyes    Endometrial polyp    Essential hypertension 08/25/2018   GERD (gastroesophageal reflux disease)    History of hiatal hernia    History of kidney stones    Hot flashes, menopausal 10/13/2011   Estradiol started    Jaundice as teenager   no problems since   Memory loss 09/21/2019     1/2 of feet numb all the time   Multiple sclerosis ([place]) dx 2001   Neuropathy    Neuropathy    bilateral feet   Osteoarthritis    Sjogren's syndrome ([place]) dx oct 2021   sore muscvles, dry mouth and eyes   Stroke [facility])    Vertigo    Vision abnormalities    Past Surgical History:  Procedure Laterality Date   ABDOMINAL HYSTERECTOMY  2002   partial   colonscopy  2011   DILATATION & CURETTAGE/HYSTEROSCOPY WITH MYOSURE N/A 06/08/2020   Procedure: DILATATION & CURETTAGE/HYSTEROSCOPY/Polypectomy WITH MYOSURE;  Surgeon: Armandina Stammer, DO;  Location: [facility];  Service: Gynecology;  Laterality: N/A;   LUMBAR FUSION  2001   TOTAL HIP ARTHROPLASTY Right 01/17/2021   Procedure: TOTAL HIP ARTHROPLASTY ANTERIOR APPROACH;  Surgeon: Rod Can, MD;  Location: [facility];  Service: Orthopedics;  Laterality: Right;   UPPER GI ENDOSCOPY  yrs ago    No Known Allergies  Allergies as of 04/19/2021   No Known Allergies      Medication List        Accurate as of April 19, 2021 10:26 AM. If you have any questions, ask your nurse or doctor.          CALCIUM-VITAMIN D PO Take 1  tablet by mouth in the morning.   clonazePAM 0.5 MG tablet Commonly known as: KLONOPIN TAKE 1 TABLET(0.5 MG) BY MOUTH AT BEDTIME   ezetimibe 10 MG tablet Commonly known as: Zetia Take 1 tablet (10 mg total) by mouth daily.   fexofenadine 180 MG tablet Commonly known as: ALLEGRA Take 180 mg by mouth at bedtime.   FLAXSEED OIL PO Take 5-10 mLs by mouth 3 (three) times a week.   leflunomide 20  MG tablet Commonly known as: ARAVA Take 1 tablet (20 mg total) by mouth daily.   lidocaine 4 % cream Commonly known as: LMX Apply 1 application topically at bedtime as needed (neuropathy). With lavender   losartan 100 MG tablet Commonly known as: COZAAR Take 1 tablet (100 mg total) by mouth daily.   modafinil 200 MG tablet Commonly known as: PROVIGIL Take 1 tablet (200 mg total) by mouth every morning.   Omega-3 300 MG Caps Take 300 mg by mouth in the morning.   omeprazole 40 MG capsule Commonly known as: PRILOSEC TAKE 1 CAPSULE(40 MG) BY MOUTH DAILY   ondansetron 4 MG tablet Commonly known as: Zofran Take 1 tablet (4 mg total) by mouth every 8 (eight) hours as needed for nausea or vomiting.   Salonpas 3.07-26-08 % Ptch Generic drug: Camphor-Menthol-Methyl Sal Place 1 patch onto the skin daily as needed (hip pain).   solifenacin 5 MG tablet Commonly known as: VESICARE Take 1 tablet (5 mg total) by mouth daily.        Review of Systems  Constitutional:  Negative for appetite change, chills, fatigue, fever and unexpected weight change.  HENT:  Negative for congestion, dental problem, ear discharge, ear pain, facial swelling, hearing loss, nosebleeds, postnasal drip, rhinorrhea, sinus pressure, sinus pain, sneezing, sore throat, tinnitus and trouble swallowing.   Eyes:  Positive for visual disturbance. Negative for pain, discharge, redness and itching.  Respiratory:  Negative for cough, chest tightness, shortness of breath and wheezing.   Cardiovascular:  Negative for chest pain, palpitations and leg swelling.  Gastrointestinal:  Negative for abdominal distention, abdominal pain, blood in stool, constipation, diarrhea, nausea and vomiting.  Endocrine: Negative for cold intolerance, heat intolerance, polydipsia, polyphagia and polyuria.  Genitourinary:  Negative for difficulty urinating, dysuria, flank pain, frequency and urgency.  Musculoskeletal:  Positive for arthralgias,  back pain and gait problem. Negative for joint swelling, myalgias, neck pain and neck stiffness.  Skin:  Negative for color change, pallor, rash and wound.  Neurological:  Positive for numbness. Negative for dizziness, syncope, speech difficulty, light-headedness and headaches.       MS    Hematological:  Does not bruise/bleed easily.  Psychiatric/Behavioral:  Negative for agitation, behavioral problems, confusion, hallucinations, self-injury, sleep disturbance and suicidal ideas. The patient is not nervous/anxious.    Immunization History  Administered Date(s) Administered   Fluad Quad(high Dose 65+) 04/01/2019   Influenza Whole 05/14/2011   Influenza, High Dose Seasonal PF 04/30/2017, 05/15/2018, 05/18/2020   PFIZER(Purple Top)SARS-COV-2 Vaccination 08/27/2019, 09/21/2019, 03/07/2020   Pneumococcal Conjugate-13 08/30/2018   Pneumococcal Polysaccharide-23 03/21/2009, 04/20/2020   Td 12/04/2009   Zoster, Live 11/29/2009   Pertinent  Health Maintenance Due  Topic Date Due   DEXA SCAN  Never done   INFLUENZA VACCINE  02/18/2021   MAMMOGRAM  04/16/2023   Fall Risk  01/25/2021 01/16/2021 11/29/2020 11/21/2020 10/11/2020  Falls in the past year? 0 0 0 0 0  Number falls in past yr: 0 0 0 0 0  Injury with Fall?  0 0 0 - 0  Risk for fall due to : No Fall Risks No Fall Risks - - -  Risk for fall due to: Comment - - - - -  Follow up Falls evaluation completed Falls evaluation completed - - -   Functional Status Survey:    There were no vitals filed for this visit. There is no height or weight on file to calculate BMI. Physical Exam Vitals reviewed.  Constitutional:      General: She is not in acute distress.    Appearance: Normal appearance. She is normal weight. She is not ill-appearing or diaphoretic.  HENT:     Head: Normocephalic.     Right Ear: Tympanic membrane, ear canal and external ear normal. There is no impacted cerumen.     Left Ear: Tympanic membrane, ear canal and external  ear normal. There is no impacted cerumen.     Nose: Nose normal. No congestion or rhinorrhea.     Mouth/Throat:     Mouth: Mucous membranes are moist.     Pharynx: Oropharynx is clear. No oropharyngeal exudate or posterior oropharyngeal erythema.  Eyes:     General: No scleral icterus.       Right eye: No discharge.        Left eye: No discharge.     Extraocular Movements: Extraocular movements intact.     Conjunctiva/sclera: Conjunctivae normal.     Pupils: Pupils are equal, round, and reactive to light.  Neck:     Vascular: No carotid bruit.  Cardiovascular:     Rate and Rhythm: Normal rate and regular rhythm.     Pulses: Normal pulses.     Heart sounds: Normal heart sounds. No murmur heard.   No friction rub. No gallop.  Pulmonary:     Effort: Pulmonary effort is normal. No respiratory distress.     Breath sounds: Normal breath sounds. No wheezing, rhonchi or rales.  Chest:     Chest wall: No tenderness.  Abdominal:     General: Bowel sounds are normal. There is no distension.     Palpations: Abdomen is soft. There is no mass.     Tenderness: There is no abdominal tenderness. There is no right CVA tenderness, left CVA tenderness, guarding or rebound.  Musculoskeletal:        General: No swelling or tenderness. Normal range of motion.     Cervical back: Normal range of motion. No rigidity or tenderness.     Right lower leg: No edema.     Left lower leg: No edema.     Comments: Unsteady gait walks with a walker   Lymphadenopathy:     Cervical: No cervical adenopathy.  Skin:    General: Skin is warm and dry.     Coloration: Skin is not pale.     Findings: No bruising, erythema, lesion or rash.  Neurological:     Mental Status: She is alert and oriented to person, place, and time.     Cranial Nerves: No cranial nerve deficit.     Sensory: No sensory deficit.     Motor: No weakness.     Coordination: Coordination normal.     Gait: Gait abnormal.  Psychiatric:        Mood  and Affect: Mood normal.        Speech: Speech normal.        Behavior: Behavior normal.        Thought Content: Thought content normal.  Judgment: Judgment normal.    Labs reviewed: Recent Labs    01/07/21 1122 01/18/21 0243 01/25/21 1527  NA 138 140 141  K 4.0 4.3 4.3  CL 106 108 108  CO2 _0 GLUCOSE 94 121* 85  BUN _1 CREATININE 0.75 0.60 0.59*  CALCIUM 9.4 8.9 9.4   Recent Labs    10/02/20 1607 11/19/20 0945 01/07/21 1122 01/25/21 1527  AST _2 32  ALT 69* 25 20 50*  ALKPHOS 161*  --  113  --   BILITOT <0.2 0.4 0.5 0.4  PROT 6.1 6.1 6.7 5.7*  ALBUMIN 4.2  --  4.5  --    Recent Labs    10/02/20 1607 11/19/20 0945 01/07/21 1122 01/18/21 0243 01/25/21 1527  WBC 9.4 3.8 5.5 11.4* 6.9  NEUTROABS 7.6* 2,778  --   --  5,258  HGB 13.4 14.0 13.6 11.1* 11.4*  HCT 39.8 42.8 42.0 35.4* 35.7  MCV 88 89.7 91.5 91.9 89.7  PLT 223 183 217 194 303   Lab Results  Component Value Date   TSH 4.46 11/19/2020   No results found for: HGBA1C Lab Results  Component Value Date   CHOL 211 (H) 04/16/2021   HDL 48 (L) 04/16/2021   LDLCALC 129 (H) 04/16/2021   LDLDIRECT 130.2 11/03/2011   TRIG 196 (H) 04/16/2021   CHOLHDL 4.4 04/16/2021    Significant Diagnostic Results in last 30 days:  MM 3D SCREEN BREAST BILATERAL  Result Date: 04/19/2021 CLINICAL DATA:  Screening. EXAM: DIGITAL SCREENING BILATERAL MAMMOGRAM WITH TOMOSYNTHESIS AND CAD TECHNIQUE: Bilateral screening digital craniocaudal and mediolateral oblique mammograms were obtained. Bilateral screening digital breast tomosynthesis was performed. The images were evaluated with computer-aided detection. COMPARISON:  Previous exam(s). ACR Breast Density Category b: There are scattered areas of fibroglandular density. FINDINGS: There are no findings suspicious for malignancy. IMPRESSION: No mammographic evidence of malignancy. A result letter of this screening mammogram will be mailed directly to the  patient. RECOMMENDATION: Screening mammogram in one year. (Code:SM-B-01Y) BI-RADS CATEGORY  1: Negative. Electronically Signed   By: Evangeline Dakin M.D.   On: 04/19/2021 10:16    Assessment/Plan 1. Essential hypertension B/p well controlled.No home readings for review. - continue on Losartan  - CBC with Differential/Platelet; Future - CMP with eGFR([facility]); Future - TSH; Future  2. Mixed hyperlipidemia LDL not at goal.Had statin intolerance  Will hold Zetia for few days due to reports of diarrhea and muscle aches then restart if not suspecting side effect from Zetia.  - additional DASH Eating plan Education information provided on AVS  - Lipid panel; Future  3. Chronic bilateral low back pain without sciatica Chronic  - continue to work with Physical Therapy  - follow up with Orthopedic service as advised   4. MULTIPLE SCLEROSIS, PROGRESSIVE/RELAPSING Continue to follow up with Neurologist Dr.[name] Richard   5. Sjogren's syndrome with other organ involvement ([place]) Chronic  Continue with Biotin use   6. Need for Tdap vaccination Advised to get Tdap vaccine at the pharmacy. Script send to pharmacy today - Tdap ([place]) 5-2.5-18.5 LF-MCG/0.5 injection; Inject 0.5 mLs into the muscle once for 1 dose.  Dispense: 0.5 mL; Refill: 0  7. Need for influenza vaccination Afebrile  Flut shot administered by CMA no acute reaction reported.  - Flu Vaccine QUAD High Dose(Fluad)  Family/ staff Communication: Reviewed plan of care with patient verbalized understanding  Labs/tests ordered:  - CBC with Differential/Platelet - CMP with eGFR([facility]) -  TSH - Lipid panel  Next Appointment : 6 months for medical management of chronic issues.Fasting Labs prior to visit.   Sandrea Hughs, NP

## 2021-04-22 ENCOUNTER — Encounter (HOSPITAL_BASED_OUTPATIENT_CLINIC_OR_DEPARTMENT_OTHER): Payer: Self-pay | Admitting: Physical Therapy

## 2021-04-22 ENCOUNTER — Other Ambulatory Visit: Payer: Self-pay

## 2021-04-22 ENCOUNTER — Ambulatory Visit (HOSPITAL_BASED_OUTPATIENT_CLINIC_OR_DEPARTMENT_OTHER): Payer: Medicare Other | Attending: Neurology | Admitting: Physical Therapy

## 2021-04-22 DIAGNOSIS — R252 Cramp and spasm: Secondary | ICD-10-CM | POA: Diagnosis not present

## 2021-04-22 DIAGNOSIS — M542 Cervicalgia: Secondary | ICD-10-CM | POA: Diagnosis not present

## 2021-04-22 DIAGNOSIS — M6281 Muscle weakness (generalized): Secondary | ICD-10-CM

## 2021-04-22 DIAGNOSIS — R262 Difficulty in walking, not elsewhere classified: Secondary | ICD-10-CM

## 2021-04-22 DIAGNOSIS — G8929 Other chronic pain: Secondary | ICD-10-CM | POA: Diagnosis not present

## 2021-04-22 DIAGNOSIS — M545 Low back pain, unspecified: Secondary | ICD-10-CM | POA: Insufficient documentation

## 2021-04-23 ENCOUNTER — Other Ambulatory Visit: Payer: Self-pay | Admitting: Neurology

## 2021-04-23 ENCOUNTER — Encounter (HOSPITAL_BASED_OUTPATIENT_CLINIC_OR_DEPARTMENT_OTHER): Payer: Self-pay | Admitting: Physical Therapy

## 2021-04-23 DIAGNOSIS — M48061 Spinal stenosis, lumbar region without neurogenic claudication: Secondary | ICD-10-CM | POA: Diagnosis not present

## 2021-04-23 NOTE — Therapy (Signed)
[facility] [address] [place], [place], [address] Phone: [phone]   Fax:  [phone]  Physical Therapy Treatment  Patient Details  Name: Dawn [name] MRN: [id number] Date of Birth: December 30, 1945 Referring Provider (PT): Dr Arlice Colt   Encounter Date: 04/22/2021   PT End of Session - 04/22/21 1210     Visit Number 7    Number of Visits 12    Date for PT Re-Evaluation 06/06/21    Authorization Type progress note on visit 10    PT Start Time 1100    PT Stop Time 1142    PT Time Calculation (min) 42 min    Activity Tolerance Patient tolerated treatment well    Behavior During Therapy [facility] for tasks assessed/performed             Past Medical History:  Diagnosis Date   Allergy    Dry eyes    Endometrial polyp    Essential hypertension 08/25/2018   GERD (gastroesophageal reflux disease)    History of hiatal hernia    History of kidney stones    Hot flashes, menopausal 10/13/2011   Estradiol started    Jaundice as teenager   no problems since   Memory loss 09/21/2019     1/2 of feet numb all the time   Multiple sclerosis ([place]) dx 2001   Neuropathy    Neuropathy    bilateral feet   Osteoarthritis    Sjogren's syndrome ([place]) dx oct 2021   sore muscvles, dry mouth and eyes   Stroke [facility])    Vertigo    Vision abnormalities     Past Surgical History:  Procedure Laterality Date   ABDOMINAL HYSTERECTOMY  2002   partial   colonscopy  2011   DILATATION & CURETTAGE/HYSTEROSCOPY WITH MYOSURE N/A 06/08/2020   Procedure: DILATATION & CURETTAGE/HYSTEROSCOPY/Polypectomy WITH MYOSURE;  Surgeon: Armandina Stammer, DO;  Location: [facility];  Service: Gynecology;  Laterality: N/A;   LUMBAR FUSION  2001   TOTAL HIP ARTHROPLASTY Right 01/17/2021   Procedure: TOTAL HIP ARTHROPLASTY ANTERIOR APPROACH;  Surgeon: Rod Can, MD;  Location: [facility];  Service: Orthopedics;  Laterality: Right;   UPPER  GI ENDOSCOPY  yrs ago    There were no vitals filed for this visit.   Subjective Assessment - 04/22/21 1205     Subjective Patient reports her back continues to be very sore. She is having difficulty with daily tasks like making her bed and taking a shower.    Pertinent History MS    How long can you stand comfortably? hurts as soon as she stands. Has to shift frequently    How long can you walk comfortably? limited community ambualtion    Diagnostic tests Nothing recent    Patient Stated Goals to go home    Currently in Pain? Yes    Pain Score 7     Pain Location Back    Pain Orientation Left;Right    Pain Descriptors / Indicators Aching    Pain Onset More than a month ago    Pain Frequency Constant    Aggravating Factors  standing and walking    Pain Relieving Factors light exercises    Effect of Pain on Daily Activities difficulty walking and standing    Multiple Pain Sites No                          Warm up: heel/toe walking  x4 laps across pool chest deep side stepping x4 laps from shallow to deep;  Ambulation with noodle support: long strides, march   Exercises; Slow march x20; squats x20; hip extension x20; hip abduction x20; Sit to stand x20;  all with 2lb weights    board trunk flexion x10 lateral board rotation x10 each way seated    Seated LAQ and March x20 2lbs    Noodle push and pull x20 with core breathing; noodle press x20 with core breathing   Narrow base of support eo and ec 3x20 sec hold  Tandem stance 3x20 sec hold        Pt requires buoyancy for support and to offload joints with strengthening exercises. Viscosity of the water is needed for resistance of strengthening; water current perturbations provides challenge to standing balance unsupported, requiring increased core activation.                  PT Education - 04/22/21 1208     Education Details reviewed aquatic exercises    Person(s) Educated Patient    Methods  Explanation;Demonstration;Tactile cues;Verbal cues    Comprehension Verbalized understanding;Returned demonstration;Verbal cues required;Tactile cues required              PT Short Term Goals - 03/14/21 1352       PT SHORT TERM GOAL #1   Title Patient will report a 50% reduction in lower back pain    Time 4    Period Weeks    Status New    Target Date 01/17/21      PT SHORT TERM GOAL #2   Title Patient will increase distance on 6 min walk test by 200'    Time 4    Period Weeks    Status New    Target Date 04/11/21      PT SHORT TERM GOAL #3   Title Patient increase right hip flexion strength to 4+/5    Time 4    Period Weeks    Status New    Target Date 04/11/21               PT Long Term Goals - 03/14/21 1356       PT LONG TERM GOAL #1   Title Patient will return to [facility] with a full program for her hip    Time 12    Period Weeks    Status New    Target Date 06/06/21      PT LONG TERM GOAL #2   Title Patient will ambualte 1500' on the 6 min walk test in order to improve community ambualtion    Time 12    Period Weeks    Status New    Target Date 06/06/21      PT LONG TERM GOAL #3   Title Patient will stand for 30 min without a significant increase in pain in order to perfrom ADL's    Time 12    Period Weeks    Status New    Target Date 06/06/21                   Plan - 04/22/21 1210     Clinical Impression Statement Patient can tolerate exercises in the water. She had no increase in pain. Therapy reviewed stretches in the water. She was able to complete exercises with weights for her hip. She will be far enough out from her surgery next visit to trial trigger point dry needling. We  will do a land visit next vist.    Personal Factors and Comorbidities Comorbidity 1;Comorbidity 2    Comorbidities MS, OA    Examination-Activity Limitations Bed Mobility    Examination-Participation Restrictions Meal Prep;Cleaning;Community  Activity;Laundry;Shop    Stability/Clinical Decision Making Stable/Uncomplicated    Clinical Decision Making Low    Rehab Potential Good    PT Frequency 1x / week    PT Duration 8 weeks    PT Treatment/Interventions ADLs/Self Care Home Management;Electrical Stimulation;Cryotherapy;Iontophoresis 4mg /ml Dexamethasone;Moist Heat;Traction;DME Instruction;Neuromuscular re-education;Patient/family education;Manual techniques;Passive range of motion;Taping;Therapeutic activities;Therapeutic exercise;Balance training;Gait training;Stair training;Functional mobility training;Dry needling;Ultrasound    PT Next Visit Plan continue with pool and land strengthening, manual therapy to back    PT Home Exercise Plan reviewed seated hamstring stretch and LTR. She has done those in the past.    Consulted and Agree with Plan of Care Patient             Patient will benefit from skilled therapeutic intervention in order to improve the following deficits and impairments:  Abnormal gait, Difficulty walking, Decreased range of motion, Decreased mobility, Decreased strength, Postural dysfunction, Pain, Increased muscle spasms  Visit Diagnosis: Chronic bilateral low back pain without sciatica  Difficulty in walking, not elsewhere classified  Muscle weakness (generalized)  Cervicalgia  Cramp and spasm     Problem List Patient Active Problem List   Diagnosis Date Noted   Statin intolerance 04/19/2021   Osteoarthritis of right hip 01/17/2021   Status post THR (total hip replacement) 01/17/2021   Chronic bilateral low back pain without sciatica 10/02/2020   Chronic pain syndrome 06/20/2020   Post laminectomy syndrome 06/20/2020   Sjogren's disease ([place]) 05/23/2020   Primary osteoarthritis of both hands 05/23/2020   Primary osteoarthritis of both feet 05/23/2020   Sacroiliitis, not elsewhere classified ([place]) 12/22/2019   Other secondary scoliosis, lumbar region 12/22/2019   Spondylosis without  myelopathy or radiculopathy, lumbar region 12/22/2019   Cervical radiculopathy 10/18/2019   Numbness 09/21/2019   Bilateral carpal tunnel syndrome 09/21/2019   High risk medication use 09/21/2019   Memory loss 09/21/2019   Essential hypertension 08/25/2018   Abnormal SPEP 02/19/2018   Hand pain 02/20/2017   Multiple joint pain 02/18/2017   Right elbow pain 12/09/2016   Disturbed cognition 06/25/2016   Trochanteric bursitis of left hip 02/18/2016   Sciatica, right side 10/30/2015   Bilateral arm pain 10/10/2015   Trochanteric bursitis of both hips 08/04/2014   Chronic fatigue 08/04/2014   Urinary frequency 08/04/2014   Abnormality of gait 08/04/2014   Unspecified visual disturbance 01/31/2013   Transient vision disturbance 01/31/2013   Routine general medical examination at a health care facility 11/04/2011   Colon polyps 11/03/2011   Hot flashes, menopausal 10/13/2011   POSTHERPETIC NEURALGIA 10/03/2009   DISPLCMT LUMBAR INTERVERT DISC W/O MYELOPATHY 09/05/2009   RENAL CALCULUS, RECURRENT 09/04/2009   Hyperlipidemia 08/31/2009   MULTIPLE SCLEROSIS, PROGRESSIVE/RELAPSING 08/31/2009   ALLERGIC RHINITIS 08/31/2009    Carney Living, PT DPT  04/23/2021, 9:47 AM  [facility] [address] [place], [place], [address] Phone: [phone]   Fax:  [phone]  Name: Dawn [name] MRN: [id number] Date of Birth: 1946/07/14

## 2021-04-23 NOTE — Telephone Encounter (Signed)
Received refill request for modafinil.  Last OV was on 10/02/20.  Next OV is scheduled for 08/01/21 .  Last RX was written on 09/25/20 for 135 tabs.   [place] [facility] Database has been reviewed.

## 2021-04-25 ENCOUNTER — Other Ambulatory Visit: Payer: Self-pay | Admitting: Physical Medicine and Rehabilitation

## 2021-04-25 DIAGNOSIS — R29898 Other symptoms and signs involving the musculoskeletal system: Secondary | ICD-10-CM

## 2021-04-25 DIAGNOSIS — M5440 Lumbago with sciatica, unspecified side: Secondary | ICD-10-CM

## 2021-04-26 DIAGNOSIS — Z23 Encounter for immunization: Secondary | ICD-10-CM | POA: Diagnosis not present

## 2021-04-29 ENCOUNTER — Encounter (HOSPITAL_BASED_OUTPATIENT_CLINIC_OR_DEPARTMENT_OTHER): Payer: Self-pay | Admitting: Physical Therapy

## 2021-04-29 ENCOUNTER — Other Ambulatory Visit: Payer: Self-pay

## 2021-04-29 ENCOUNTER — Ambulatory Visit (HOSPITAL_BASED_OUTPATIENT_CLINIC_OR_DEPARTMENT_OTHER): Payer: Medicare Other | Admitting: Physical Therapy

## 2021-04-29 DIAGNOSIS — M542 Cervicalgia: Secondary | ICD-10-CM | POA: Diagnosis not present

## 2021-04-29 DIAGNOSIS — M545 Low back pain, unspecified: Secondary | ICD-10-CM

## 2021-04-29 DIAGNOSIS — R262 Difficulty in walking, not elsewhere classified: Secondary | ICD-10-CM | POA: Diagnosis not present

## 2021-04-29 DIAGNOSIS — G8929 Other chronic pain: Secondary | ICD-10-CM

## 2021-04-29 DIAGNOSIS — M6281 Muscle weakness (generalized): Secondary | ICD-10-CM | POA: Diagnosis not present

## 2021-04-29 DIAGNOSIS — R252 Cramp and spasm: Secondary | ICD-10-CM

## 2021-04-29 NOTE — Therapy (Signed)
[facility] [address] [place], [place], [address] Phone: [phone]   Fax:  [phone]  Physical Therapy Treatment  Patient Details  Name: Dawn [name] MRN: [id number] Date of Birth: Mar 30, 1946 Referring Provider (PT): Dr Arlice Colt   Encounter Date: 04/29/2021   PT End of Session - 04/29/21 1143     Visit Number 8    Number of Visits 12    Date for PT Re-Evaluation 06/06/21    Authorization Type progress note on visit 10    PT Start Time 1106    PT Stop Time 1146    PT Time Calculation (min) 40 min    Activity Tolerance Patient tolerated treatment well    Behavior During Therapy [facility] for tasks assessed/performed             Past Medical History:  Diagnosis Date   Allergy    Dry eyes    Endometrial polyp    Essential hypertension 08/25/2018   GERD (gastroesophageal reflux disease)    History of hiatal hernia    History of kidney stones    Hot flashes, menopausal 10/13/2011   Estradiol started    Jaundice as teenager   no problems since   Memory loss 09/21/2019     1/2 of feet numb all the time   Multiple sclerosis ([place]) dx 2001   Neuropathy    Neuropathy    bilateral feet   Osteoarthritis    Sjogren's syndrome ([place]) dx oct 2021   sore muscvles, dry mouth and eyes   Stroke [facility])    Vertigo    Vision abnormalities     Past Surgical History:  Procedure Laterality Date   ABDOMINAL HYSTERECTOMY  2002   partial   colonscopy  2011   DILATATION & CURETTAGE/HYSTEROSCOPY WITH MYOSURE N/A 06/08/2020   Procedure: DILATATION & CURETTAGE/HYSTEROSCOPY/Polypectomy WITH MYOSURE;  Surgeon: Armandina Stammer, DO;  Location: [facility];  Service: Gynecology;  Laterality: N/A;   LUMBAR FUSION  2001   TOTAL HIP ARTHROPLASTY Right 01/17/2021   Procedure: TOTAL HIP ARTHROPLASTY ANTERIOR APPROACH;  Surgeon: Rod Can, MD;  Location[facility];  Service: Orthopedics;  Laterality: Right;    UPPER GI ENDOSCOPY  yrs ago    There were no vitals filed for this visit.   Subjective Assessment - 04/29/21 1139     Subjective Patient has been to the MD about his lower back. She will have an MRI soon. She has had significant pain in her lower back.    Pertinent History MS    How long can you stand comfortably? hurts as soon as she stands. Has to shift frequently    How long can you walk comfortably? limited community ambualtion    Diagnostic tests Nothing recent    Patient Stated Goals to go home    Currently in Pain? Yes    Pain Score 7     Pain Location Back    Pain Orientation Left;Right    Pain Descriptors / Indicators Aching    Pain Type Chronic pain    Pain Onset More than a month ago    Pain Frequency Constant    Aggravating Factors  standing and walking    Pain Relieving Factors light exercises    Effect of Pain on Daily Activities difficulty walking and standing    Multiple Pain Sites No  [facility] Adult PT Treatment/Exercise - 04/29/21 0001       Lumbar Exercises: Stretches   Lower Trunk Rotation Limitations x20      Lumbar Exercises: Seated   Other Seated Lumbar Exercises ball roll out 5x 10 sec hold lateral 5x each 10 sec hold ; ball press 2x10      Manual Therapy   Manual therapy comments skilled palaption of trigger points    Soft tissue mobilization trigger point release to the upper gluteal and lumbar paraspinal. roller to bilateral gluteals in [place]    Manual Traction LAD Grade II-III left hip pain              Trigger Point Dry Needling - 04/29/21 0001     Consent Given? Yes    Education Handout Provided Previously provided    Muscles Treated Back/Hip Lumbar multifidi    Other Dry Needling 2 spots in left gluteal and 2 spots in the lower lumbar left paraspinals    Gluteus Medius Response Twitch response elicited    Lumbar multifidi Response Twitch response elicited;Palpable increased muscle  length                   PT Education - 04/29/21 1142     Education Details reviewed HEp and symptom management    Person(s) Educated Patient    Methods Explanation;Verbal cues;Tactile cues;Demonstration    Comprehension Verbalized understanding;Returned demonstration;Verbal cues required              PT Short Term Goals - 03/14/21 1352       PT SHORT TERM GOAL #1   Title Patient will report a 50% reduction in lower back pain    Time 4    Period Weeks    Status New    Target Date 01/17/21      PT SHORT TERM GOAL #2   Title Patient will increase distance on 6 min walk test by 200'    Time 4    Period Weeks    Status New    Target Date 04/11/21      PT SHORT TERM GOAL #3   Title Patient increase right hip flexion strength to 4+/5    Time 4    Period Weeks    Status New    Target Date 04/11/21               PT Long Term Goals - 03/14/21 1356       PT LONG TERM GOAL #1   Title Patient will return to [facility] with a full program for her hip    Time 12    Period Weeks    Status New    Target Date 06/06/21      PT LONG TERM GOAL #2   Title Patient will ambualte 1500' on the 6 min walk test in order to improve community ambualtion    Time 12    Period Weeks    Status New    Target Date 06/06/21      PT LONG TERM GOAL #3   Title Patient will stand for 30 min without a significant increase in pain in order to perfrom ADL's    Time 12    Period Weeks    Status New    Target Date 06/06/21                   Plan - 04/29/21 1143     Clinical Impression Statement Patient had a great  twtich respose in her gluteal. She reported improved soreness and stiffness after. Therapy reviewed ball exercises and stretches for hoe. We will progress the patient as tolerated as far as the exercises go.    Personal Factors and Comorbidities Comorbidity 1;Comorbidity 2    Comorbidities MS, OA    Examination-Activity Limitations Bed Mobility     Examination-Participation Restrictions Meal Prep;Cleaning;Community Activity;Laundry;Shop    Stability/Clinical Decision Making Stable/Uncomplicated    Clinical Decision Making Low    Rehab Potential Good    PT Frequency 1x / week    PT Duration 8 weeks    PT Treatment/Interventions ADLs/Self Care Home Management;Electrical Stimulation;Cryotherapy;Iontophoresis 4mg /ml Dexamethasone;Moist Heat;Traction;DME Instruction;Neuromuscular re-education;Patient/family education;Manual techniques;Passive range of motion;Taping;Therapeutic activities;Therapeutic exercise;Balance training;Gait training;Stair training;Functional mobility training;Dry needling;Ultrasound    PT Next Visit Plan continue with pool and land strengthening, manual therapy to back    PT Home Exercise Plan reviewed seated hamstring stretch and LTR. She has done those in the past.    Consulted and Agree with Plan of Care Patient             Patient will benefit from skilled therapeutic intervention in order to improve the following deficits and impairments:  Abnormal gait, Difficulty walking, Decreased range of motion, Decreased mobility, Decreased strength, Postural dysfunction, Pain, Increased muscle spasms  Visit Diagnosis: Chronic bilateral low back pain without sciatica  Difficulty in walking, not elsewhere classified  Muscle weakness (generalized)  Cervicalgia  Cramp and spasm     Problem List Patient Active Problem List   Diagnosis Date Noted   Statin intolerance 04/19/2021   Osteoarthritis of right hip 01/17/2021   Status post THR (total hip replacement) 01/17/2021   Chronic bilateral low back pain without sciatica 10/02/2020   Chronic pain syndrome 06/20/2020   Post laminectomy syndrome 06/20/2020   Sjogren's disease ([place]) 05/23/2020   Primary osteoarthritis of both hands 05/23/2020   Primary osteoarthritis of both feet 05/23/2020   Sacroiliitis, not elsewhere classified ([place]) 12/22/2019   Other  secondary scoliosis, lumbar region 12/22/2019   Spondylosis without myelopathy or radiculopathy, lumbar region 12/22/2019   Cervical radiculopathy 10/18/2019   Numbness 09/21/2019   Bilateral carpal tunnel syndrome 09/21/2019   High risk medication use 09/21/2019   Memory loss 09/21/2019   Essential hypertension 08/25/2018   Abnormal SPEP 02/19/2018   Hand pain 02/20/2017   Multiple joint pain 02/18/2017   Right elbow pain 12/09/2016   Disturbed cognition 06/25/2016   Trochanteric bursitis of left hip 02/18/2016   Sciatica, right side 10/30/2015   Bilateral arm pain 10/10/2015   Trochanteric bursitis of both hips 08/04/2014   Chronic fatigue 08/04/2014   Urinary frequency 08/04/2014   Abnormality of gait 08/04/2014   Unspecified visual disturbance 01/31/2013   Transient vision disturbance 01/31/2013   Routine general medical examination at a health care facility 11/04/2011   Colon polyps 11/03/2011   Hot flashes, menopausal 10/13/2011   POSTHERPETIC NEURALGIA 10/03/2009   DISPLCMT LUMBAR INTERVERT DISC W/O MYELOPATHY 09/05/2009   RENAL CALCULUS, RECURRENT 09/04/2009   Hyperlipidemia 08/31/2009   MULTIPLE SCLEROSIS, PROGRESSIVE/RELAPSING 08/31/2009   ALLERGIC RHINITIS 08/31/2009    Carney Living, PT 04/29/2021, 12:41 PM  [facility] [address] [place], [place], [address] Phone: [phone]   Fax:  [phone]  Name: Dawn [name] MRN: [id number] Date of Birth: 1945/10/25

## 2021-05-01 ENCOUNTER — Encounter (HOSPITAL_BASED_OUTPATIENT_CLINIC_OR_DEPARTMENT_OTHER): Payer: Self-pay | Admitting: Physical Therapy

## 2021-05-08 ENCOUNTER — Encounter (HOSPITAL_BASED_OUTPATIENT_CLINIC_OR_DEPARTMENT_OTHER): Payer: Self-pay | Admitting: Physical Therapy

## 2021-05-08 ENCOUNTER — Ambulatory Visit (HOSPITAL_BASED_OUTPATIENT_CLINIC_OR_DEPARTMENT_OTHER): Payer: Medicare Other | Admitting: Physical Therapy

## 2021-05-08 ENCOUNTER — Other Ambulatory Visit: Payer: Self-pay

## 2021-05-08 DIAGNOSIS — M542 Cervicalgia: Secondary | ICD-10-CM | POA: Diagnosis not present

## 2021-05-08 DIAGNOSIS — M6281 Muscle weakness (generalized): Secondary | ICD-10-CM

## 2021-05-08 DIAGNOSIS — R252 Cramp and spasm: Secondary | ICD-10-CM

## 2021-05-08 DIAGNOSIS — M545 Low back pain, unspecified: Secondary | ICD-10-CM | POA: Diagnosis not present

## 2021-05-08 DIAGNOSIS — R262 Difficulty in walking, not elsewhere classified: Secondary | ICD-10-CM | POA: Diagnosis not present

## 2021-05-08 DIAGNOSIS — G8929 Other chronic pain: Secondary | ICD-10-CM

## 2021-05-09 ENCOUNTER — Encounter (HOSPITAL_BASED_OUTPATIENT_CLINIC_OR_DEPARTMENT_OTHER): Payer: Self-pay | Admitting: Physical Therapy

## 2021-05-09 NOTE — Therapy (Signed)
[facility] [address] [place], [place], [address] Phone: [phone]   Fax:  [phone]  Physical Therapy Treatment  Patient Details  Name: Dawn [name] MRN: [id number] Date of Birth: [birth year] Referring Provider (PT): Dr Arlice Colt   Encounter Date: 05/08/2021   PT End of Session - 05/08/21 1406     Visit Number 9    Number of Visits 12    Date for PT Re-Evaluation 06/06/21    Authorization Type progress note on visit 10    PT Start Time 1345    PT Stop Time 1427    PT Time Calculation (min) 42 min    Activity Tolerance Patient tolerated treatment well    Behavior During Therapy [facility] for tasks assessed/performed             Past Medical History:  Diagnosis Date   Allergy    Dry eyes    Endometrial polyp    Essential hypertension 08/25/2018   GERD (gastroesophageal reflux disease)    History of hiatal hernia    History of kidney stones    Hot flashes, menopausal 10/13/2011   Estradiol started    Jaundice as teenager   no problems since   Memory loss 09/21/2019     1/2 of feet numb all the time   Multiple sclerosis ([place]) dx 2001   Neuropathy    Neuropathy    bilateral feet   Osteoarthritis    Sjogren's syndrome ([place]) dx oct 2021   sore muscvles, dry mouth and eyes   Stroke [facility])    Vertigo    Vision abnormalities     Past Surgical History:  Procedure Laterality Date   ABDOMINAL HYSTERECTOMY  2002   partial   colonscopy  2011   DILATATION & CURETTAGE/HYSTEROSCOPY WITH MYOSURE N/A 06/08/2020   Procedure: DILATATION & CURETTAGE/HYSTEROSCOPY/Polypectomy WITH MYOSURE;  Surgeon: Armandina Stammer, DO;  Location: [place];  Service: Gynecology;  Laterality: N/A;   LUMBAR FUSION  2001   TOTAL HIP ARTHROPLASTY Right 01/17/2021   Procedure: TOTAL HIP ARTHROPLASTY ANTERIOR APPROACH;  Surgeon: Rod Can, MD;  Location: [facility];  Service: Orthopedics;  Laterality: Right;    UPPER GI ENDOSCOPY  yrs ago    There were no vitals filed for this visit.   Subjective Assessment - 05/08/21 1349     Subjective Patient reports that her back released after the last visit. She is still having pain in her hips but it is much better. She has some stiffness in the morning, but overall she is doing much better.    Pertinent History MS    How long can you stand comfortably? hurts as soon as she stands. Has to shift frequently    How long can you walk comfortably? limited community ambualtion    Diagnostic tests Nothing recent    Patient Stated Goals to go home    Currently in Pain? Yes    Pain Score 4     Pain Location Back    Pain Orientation Right    Pain Descriptors / Indicators Aching    Pain Type Chronic pain    Pain Onset More than a month ago    Pain Frequency Constant    Aggravating Factors  standing and walking    Pain Relieving Factors light exercises    Effect of Pain on Daily Activities difficulty walking and standing    Multiple Pain Sites No  [place] Adult PT Treatment/Exercise - 05/09/21 0001       Lumbar Exercises: Stretches   Lower Trunk Rotation Limitations x20      Lumbar Exercises: Standing   Heel Raises 20 reps    Other Standing Lumbar Exercises standing hip flexion x15; hip abduction 2x15      Lumbar Exercises: Seated   LAQ on Chair Weights (lbs) x20 red band    Other Seated Lumbar Exercises hamstring curl x20 red    Other Seated Lumbar Exercises seated clamshell with band 3x10 red;      Manual Therapy   Manual therapy comments skilled palaption of trigger points    Soft tissue mobilization trigger point release to the upper gluteal and lumbar paraspinal. roller to bilateral gluteals in [place]    Manual Traction LAD Grade II-III left hip pain              Trigger Point Dry Needling - 05/09/21 0001     Consent Given? Yes    Education Handout Provided Previously provided    Muscles  Treated Back/Hip Lumbar multifidi    Other Dry Needling 2 spots in left gluteal and 2 spots in the lower lumbar left paraspinals 1 spot int he right gluteal    Gluteus Medius Response Twitch response elicited    Lumbar multifidi Response Twitch response elicited;Palpable increased muscle length                   PT Education - 05/08/21 1356     Education Details benefits of dry needling.    Person(s) Educated Patient    Methods Explanation;Demonstration;Tactile cues;Verbal cues    Comprehension Verbalized understanding;Verbal cues required;Returned demonstration;Tactile cues required              PT Short Term Goals - 03/14/21 1352       PT SHORT TERM GOAL #1   Title Patient will report a 50% reduction in lower back pain    Time 4    Period Weeks    Status New    Target Date 01/17/21      PT SHORT TERM GOAL #2   Title Patient will increase distance on 6 min walk test by 200'    Time 4    Period Weeks    Status New    Target Date 04/11/21      PT SHORT TERM GOAL #3   Title Patient increase right hip flexion strength to 4+/5    Time 4    Period Weeks    Status New    Target Date 04/11/21               PT Long Term Goals - 03/14/21 1356       PT LONG TERM GOAL #1   Title Patient will return to [facility] with a full program for her hip    Time 12    Period Weeks    Status New    Target Date 06/06/21      PT LONG TERM GOAL #2   Title Patient will ambualte 1500' on the 6 min walk test in order to improve community ambualtion    Time 12    Period Weeks    Status New    Target Date 06/06/21      PT LONG TERM GOAL #3   Title Patient will stand for 30 min without a significant increase in pain in order to perfrom ADL's    Time 12  Period Weeks    Status New    Target Date 06/06/21                   Plan - 05/08/21 1410     Clinical Impression Statement Patient is making good progress. She tolerated ther-ex better today. She  had no significant pain. We were able to progress her too standing exercises today. Therapy needled bilateral gluteals and left lumbar spine. Therapy will perform a rpogress note next visit. We will likely perform a 6 min walk test.    Personal Factors and Comorbidities Comorbidity 1;Comorbidity 2    Comorbidities MS, OA    Examination-Activity Limitations Bed Mobility    Examination-Participation Restrictions Meal Prep;Cleaning;Community Activity;Laundry;Shop    Stability/Clinical Decision Making Stable/Uncomplicated    Clinical Decision Making Low    Rehab Potential Good    PT Frequency 1x / week    PT Duration 8 weeks    PT Treatment/Interventions ADLs/Self Care Home Management;Electrical Stimulation;Cryotherapy;Iontophoresis 4mg /ml Dexamethasone;Moist Heat;Traction;DME Instruction;Neuromuscular re-education;Patient/family education;Manual techniques;Passive range of motion;Taping;Therapeutic activities;Therapeutic exercise;Balance training;Gait training;Stair training;Functional mobility training;Dry needling;Ultrasound    PT Next Visit Plan continue with pool and land strengthening, manual therapy to back    PT Home Exercise Plan reviewed seated hamstring stretch and LTR. She has done those in the past.    Consulted and Agree with Plan of Care Patient             Patient will benefit from skilled therapeutic intervention in order to improve the following deficits and impairments:  Abnormal gait, Difficulty walking, Decreased range of motion, Decreased mobility, Decreased strength, Postural dysfunction, Pain, Increased muscle spasms  Visit Diagnosis: Chronic bilateral low back pain without sciatica  Difficulty in walking, not elsewhere classified  Muscle weakness (generalized)  Cervicalgia  Cramp and spasm     Problem List Patient Active Problem List   Diagnosis Date Noted   Statin intolerance 04/19/2021   Osteoarthritis of right hip 01/17/2021   Status post THR (total  hip replacement) 01/17/2021   Chronic bilateral low back pain without sciatica 10/02/2020   Chronic pain syndrome 06/20/2020   Post laminectomy syndrome 06/20/2020   Sjogren's disease ([place]) 05/23/2020   Primary osteoarthritis of both hands 05/23/2020   Primary osteoarthritis of both feet 05/23/2020   Sacroiliitis, not elsewhere classified ([place]) 12/22/2019   Other secondary scoliosis, lumbar region 12/22/2019   Spondylosis without myelopathy or radiculopathy, lumbar region 12/22/2019   Cervical radiculopathy 10/18/2019   Numbness 09/21/2019   Bilateral carpal tunnel syndrome 09/21/2019   High risk medication use 09/21/2019   Memory loss 09/21/2019   Essential hypertension 08/25/2018   Abnormal SPEP 02/19/2018   Hand pain 02/20/2017   Multiple joint pain 02/18/2017   Right elbow pain 12/09/2016   Disturbed cognition 06/25/2016   Trochanteric bursitis of left hip 02/18/2016   Sciatica, right side 10/30/2015   Bilateral arm pain 10/10/2015   Trochanteric bursitis of both hips 08/04/2014   Chronic fatigue 08/04/2014   Urinary frequency 08/04/2014   Abnormality of gait 08/04/2014   Unspecified visual disturbance 01/31/2013   Transient vision disturbance 01/31/2013   Routine general medical examination at a health care facility 11/04/2011   Colon polyps 11/03/2011   Hot flashes, menopausal 10/13/2011   POSTHERPETIC NEURALGIA 10/03/2009   DISPLCMT LUMBAR INTERVERT DISC W/O MYELOPATHY 09/05/2009   RENAL CALCULUS, RECURRENT 09/04/2009   Hyperlipidemia 08/31/2009   MULTIPLE SCLEROSIS, PROGRESSIVE/RELAPSING 08/31/2009   ALLERGIC RHINITIS 08/31/2009    Carney Living, PT 05/09/2021, 11:32 AM  [place], [place], [address] Phone: [phone]   Fax:  [phone]  Name: Dawn [name] MRN: [id number] Date of Birth: 04/09/1946

## 2021-05-09 NOTE — Progress Notes (Deleted)
Office Visit Note  Patient: Dawn [name]             Date of Birth: 20-Jan-1946           MRN: [id number]             PCP: Sandrea Hughs, NP Referring: Gayland Curry, DO Visit Date: 05/23/2021 Occupation: _0 @  Subjective:  No chief complaint on file.   History of Present Illness: Dawn [name] is a 75 y.o. female ***   Activities of Daily Living:  Patient reports morning stiffness for *** {minute/hour:19697}.   Patient {ACTIONS;DENIES/REPORTS:21021675::"Denies"} nocturnal pain.  Difficulty dressing/grooming: {ACTIONS;DENIES/REPORTS:21021675::"Denies"} Difficulty climbing stairs: {ACTIONS;DENIES/REPORTS:21021675::"Denies"} Difficulty getting out of chair: {ACTIONS;DENIES/REPORTS:21021675::"Denies"} Difficulty using hands for taps, buttons, cutlery, and/or writing: {ACTIONS;DENIES/REPORTS:21021675::"Denies"}  No Rheumatology ROS completed.   PMFS History:  Patient Active Problem List   Diagnosis Date Noted   Statin intolerance 04/19/2021   Osteoarthritis of right hip 01/17/2021   Status post THR (total hip replacement) 01/17/2021   Chronic bilateral low back pain without sciatica 10/02/2020   Chronic pain syndrome 06/20/2020   Post laminectomy syndrome 06/20/2020   Sjogren's disease ([place]) 05/23/2020   Primary osteoarthritis of both hands 05/23/2020   Primary osteoarthritis of both feet 05/23/2020   Sacroiliitis, not elsewhere classified ([place]) 12/22/2019   Other secondary scoliosis, lumbar region 12/22/2019   Spondylosis without myelopathy or radiculopathy, lumbar region 12/22/2019   Cervical radiculopathy 10/18/2019   Numbness 09/21/2019   Bilateral carpal tunnel syndrome 09/21/2019   High risk medication use 09/21/2019   Memory loss 09/21/2019   Essential hypertension 08/25/2018   Abnormal SPEP 02/19/2018   Hand pain 02/20/2017   Multiple joint pain 02/18/2017   Right elbow pain 12/09/2016   Disturbed cognition 06/25/2016   Trochanteric bursitis  of left hip 02/18/2016   Sciatica, right side 10/30/2015   Bilateral arm pain 10/10/2015   Trochanteric bursitis of both hips 08/04/2014   Chronic fatigue 08/04/2014   Urinary frequency 08/04/2014   Abnormality of gait 08/04/2014   Unspecified visual disturbance 01/31/2013   Transient vision disturbance 01/31/2013   Routine general medical examination at a health care facility 11/04/2011   Colon polyps 11/03/2011   Hot flashes, menopausal 10/13/2011   POSTHERPETIC NEURALGIA 10/03/2009   DISPLCMT LUMBAR INTERVERT DISC W/O MYELOPATHY 09/05/2009   RENAL CALCULUS, RECURRENT 09/04/2009   Hyperlipidemia 08/31/2009   MULTIPLE SCLEROSIS, PROGRESSIVE/RELAPSING 08/31/2009   ALLERGIC RHINITIS 08/31/2009    Past Medical History:  Diagnosis Date   Allergy    Dry eyes    Endometrial polyp    Essential hypertension 08/25/2018   GERD (gastroesophageal reflux disease)    History of hiatal hernia    History of kidney stones    Hot flashes, menopausal 10/13/2011   Estradiol started    Jaundice as teenager   no problems since   Memory loss 09/21/2019     1/2 of feet numb all the time   Multiple sclerosis ([place]) dx 2001   Neuropathy    Neuropathy    bilateral feet   Osteoarthritis    Sjogren's syndrome ([place]) dx oct 2021   sore muscvles, dry mouth and eyes   Stroke ([place])    Vertigo    Vision abnormalities     Family History  Problem Relation Age of Onset   Heart failure Mother    Heart failure Father    Arthritis Other    Hypertension Other    Past Surgical History:  Procedure Laterality Date   ABDOMINAL  HYSTERECTOMY  2002   partial   colonscopy  2011   DILATATION & CURETTAGE/HYSTEROSCOPY WITH MYOSURE N/A 06/08/2020   Procedure: DILATATION & CURETTAGE/HYSTEROSCOPY/Polypectomy WITH MYOSURE;  Surgeon: Armandina Stammer, DO;  Location: [facility];  Service: Gynecology;  Laterality: N/A;   LUMBAR FUSION  2001   TOTAL HIP ARTHROPLASTY Right 01/17/2021   Procedure: TOTAL  HIP ARTHROPLASTY ANTERIOR APPROACH;  Surgeon: Rod Can, MD;  [facility];  Service: Orthopedics;  Laterality: Right;   UPPER GI ENDOSCOPY  yrs ago   Social History   Social History Narrative   Regular Exercise-no   Widowed   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     Tobacco use, amount per day now: former   Past tobacco use, amount per day: 1/2-1 packet   How many years did you use tobacco: 12 years   Alcohol use (drinks per week): 0   Diet: mediterranean based   Do you drink/eat things with caffeine: yes   Marital status:        widow                          What year were you married? 1987   Do you live in a house, apartment, assisted living, condo, trailer, etc.? house   Is it one or more stories? 1 story   How many persons live in your home? 1   Do you have pets in your home?( please list) no   Current or past profession: Science writer    Do you exercise?          yes                        Type and how often? Tai chi and pt therapy, stretches etc....   Do you have a living will? yes   Do you have a DNR form?      no                             If not, do you want to discuss one? yes   Do you have signed POA/HPOA forms?      no                  If so, please bring to you appointment   Immunization History  Administered Date(s) Administered   Fluad Quad(high Dose 65+) 04/01/2019, 04/19/2021   Influenza Whole 05/14/2011   Influenza, High Dose Seasonal PF 04/30/2017, 05/15/2018, 05/18/2020   PFIZER(Purple Top)SARS-COV-2 Vaccination 08/27/2019, 09/21/2019, 03/07/2020, 11/16/2020   Pneumococcal Conjugate-13 08/30/2018   Pneumococcal Polysaccharide-23 03/21/2009, 04/20/2020   Td 12/04/2009   Zoster, Live 11/29/2009     Objective: Vital Signs: There were no vitals taken for this visit.    Physical Exam   Musculoskeletal Exam: ***  CDAI Exam: CDAI Score: -- Patient Global: --; Provider Global: -- Swollen: --; Tender: -- Joint Exam 05/23/2021   No joint exam has been documented for this visit   There is currently no information documented on the homunculus. Go to the Rheumatology activity and complete the homunculus joint exam.  Investigation: No additional findings.  Imaging: MM 3D SCREEN BREAST  BILATERAL  Result Date: 04/19/2021 CLINICAL DATA:  Screening. EXAM: DIGITAL SCREENING BILATERAL MAMMOGRAM WITH TOMOSYNTHESIS AND CAD TECHNIQUE: Bilateral screening digital craniocaudal and mediolateral oblique mammograms were obtained. Bilateral screening digital breast tomosynthesis was performed. The images were evaluated with computer-aided detection. COMPARISON:  Previous exam(s). ACR Breast Density Category b: There are scattered areas of fibroglandular density. FINDINGS: There are no findings suspicious for malignancy. IMPRESSION: No mammographic evidence of malignancy. A result letter of this screening mammogram will be mailed directly to the patient. RECOMMENDATION: Screening mammogram in one year. (Code:SM-B-01Y) BI-RADS CATEGORY  1: Negative. Electronically Signed   By: Evangeline Dakin M.D.   On: 04/19/2021 10:16    Recent Labs: Lab Results  Component Value Date   WBC 6.9 01/25/2021   HGB 11.4 (L) 01/25/2021   PLT 303 01/25/2021   NA 141 01/25/2021   K 4.3 01/25/2021   CL 108 01/25/2021   CO2 24 01/25/2021   GLUCOSE 85 01/25/2021   BUN 20 01/25/2021   CREATININE 0.59 (L) 01/25/2021   BILITOT 0.4 01/25/2021   ALKPHOS 113 01/07/2021   AST 32 01/25/2021   ALT 50 (H) 01/25/2021   PROT 5.7 (L) 01/25/2021   ALBUMIN 4.5 01/07/2021   CALCIUM 9.4 01/25/2021   GFRAA 105 01/25/2021   QFTBGOLD Negative 08/20/2015    Speciality Comments: No specialty comments available.  Procedures:  No procedures performed Allergies: Patient has no known allergies.    Assessment / Plan:     Visit Diagnoses: No diagnosis found.  Orders: No orders of the defined types were placed in this encounter.  No orders of the defined types were placed in this encounter.   Face-to-face time spent with patient was *** minutes. Greater than 50% of time was spent in counseling and coordination of care.  Follow-Up Instructions: No follow-ups on file.   Earnestine Mealing, CMA  Note - This record has been created using Editor, commissioning.  Chart creation errors have been sought, but may not always  have been located. Such creation errors do not reflect on  the standard of medical care.

## 2021-05-12 ENCOUNTER — Other Ambulatory Visit: Payer: Medicare Other

## 2021-05-13 ENCOUNTER — Encounter (HOSPITAL_BASED_OUTPATIENT_CLINIC_OR_DEPARTMENT_OTHER): Payer: Self-pay | Admitting: Physical Therapy

## 2021-05-14 ENCOUNTER — Ambulatory Visit (INDEPENDENT_AMBULATORY_CARE_PROVIDER_SITE_OTHER): Payer: Medicare Other | Admitting: Family

## 2021-05-14 ENCOUNTER — Encounter: Payer: Self-pay | Admitting: Family

## 2021-05-14 ENCOUNTER — Other Ambulatory Visit: Payer: Self-pay

## 2021-05-14 VITALS — BP 140/90 | HR 83 | Temp 97.8°F | Resp 16 | Ht 62.0 in | Wt 182.0 lb

## 2021-05-14 DIAGNOSIS — J0101 Acute recurrent maxillary sinusitis: Secondary | ICD-10-CM

## 2021-05-14 DIAGNOSIS — R42 Dizziness and giddiness: Secondary | ICD-10-CM | POA: Diagnosis not present

## 2021-05-14 MED ORDER — MECLIZINE HCL 25 MG PO TABS: 25.0000 mg | ORAL_TABLET | Freq: Three times a day (TID) | ORAL | 0 refills | Status: DC | PRN

## 2021-05-14 MED ORDER — AMOXICILLIN-POT CLAVULANATE 875-125 MG PO TABS: 1.0000 | ORAL_TABLET | Freq: Two times a day (BID) | ORAL | 0 refills | Status: DC

## 2021-05-14 NOTE — Progress Notes (Signed)
Provider: Marlowe Sax FNP-C  Lesley Galentine, [name], NP  Patient Care Team: Mekesha Solomon, [name], NP as PCP - General (Family Medicine) [name], MD as Consulting Physician (Orthopedic Surgery) [name], [name], MD (Neurology) [name], [name], [place] (Optometry) [name], NP as Nurse Practitioner (Obstetrics and Gynecology) [name], MD as Consulting Physician (Dermatology)  Extended Emergency Contact Information Primary Emergency Contact: Cephus Richer) Home Phone: [phone] Mobile Phone: [phone] Relation: Relative Secondary Emergency Contact: [facility] Phone: [phone] Mobile Phone: [phone] Relation: Friend  Code Status:  Full Code  Goals of care: Advanced Directive information Advanced Directives 05/14/2021  Does Patient Have a Medical Advance Directive? Yes  Type of Advance Directive Living will  Does patient want to make changes to medical advance directive? No - Patient declined  Copy of [place] in Chart? -  Would patient like information on creating a medical advance directive? No - Patient declined     Chief Complaint  Patient presents with   Acute Visit    Patient complains of having vertigo since Saturday.    HPI:  Pt is a 75 y.o. female seen today for an acute visit for evaluation of vertigo,sinus pressure and earache x 4 days.describes vertigo as " floor seems to be coming up". Has pain and pressure over left sinus area.Has had runny nose which drys up when she takes allegra. Other associated symptoms are sore scratchy throat and left ear pain. Has had vertigo in the past which resolved in the past 8 years. She took some Coricidin which seems to help.  Took meclizine and one old antibiotic. Had nausea and vomiting on Saturday but has resolved. Appetite is good.   Past Medical History:  Diagnosis Date   Allergy    Dry eyes    Endometrial polyp    Essential hypertension 08/25/2018    GERD (gastroesophageal reflux disease)    History of hiatal hernia    History of kidney stones    Hot flashes, menopausal 10/13/2011   Estradiol started    Jaundice as teenager   no problems since   Memory loss 09/21/2019     1/2 of feet numb all the time   Multiple sclerosis ([place]) dx 2001   Neuropathy    Neuropathy    bilateral feet   Osteoarthritis    Sjogren's syndrome ([place]) dx oct 2021   sore muscvles, dry mouth and eyes   Stroke [facility])    Vertigo    Vision abnormalities    Past Surgical History:  Procedure Laterality Date   ABDOMINAL HYSTERECTOMY  2002   partial   colonscopy  2011   DILATATION & CURETTAGE/HYSTEROSCOPY WITH MYOSURE N/A 06/08/2020   Procedure: DILATATION & CURETTAGE/HYSTEROSCOPY/Polypectomy WITH MYOSURE;  Surgeon: Armandina Stammer, DO;  Location: [facility];  Service: Gynecology;  Laterality: N/A;   LUMBAR FUSION  2001   TOTAL HIP ARTHROPLASTY Right 01/17/2021   Procedure: TOTAL HIP ARTHROPLASTY ANTERIOR APPROACH;  Surgeon: Rod Can, MD;  Location: [facility];  Service: Orthopedics;  Laterality: Right;   UPPER GI ENDOSCOPY  yrs ago    Allergies  Allergen Reactions   Statins Other (See Comments)    Outpatient Encounter Medications as of 05/14/2021  Medication Sig   CALCIUM-VITAMIN D PO Take 1 tablet by mouth in the morning.   Camphor-Menthol-Methyl Sal (SALONPAS) 3.07-26-08 % PTCH Place 1 patch onto the skin daily as needed (hip pain).   clonazePAM (KLONOPIN) 0.5 MG tablet TAKE 1 TABLET(0.5 MG) BY MOUTH  AT BEDTIME   ezetimibe (ZETIA) 10 MG tablet Take 1 tablet (10 mg total) by mouth daily.   fexofenadine (ALLEGRA) 180 MG tablet Take 180 mg by mouth at bedtime.    Flaxseed, Linseed, (FLAXSEED OIL PO) Take 5-10 mLs by mouth 3 (three) times a week.   leflunomide (ARAVA) 20 MG tablet Take 1 tablet (20 mg total) by mouth daily.   lidocaine (LMX) 4 % cream Apply 1 application topically at bedtime as needed (neuropathy). With lavender    losartan (COZAAR) 100 MG tablet Take 1 tablet (100 mg total) by mouth daily.   modafinil (PROVIGIL) 200 MG tablet Take 1 tablet (200 mg total) by mouth daily.   Omega-3 300 MG CAPS Take 300 mg by mouth in the morning.   omeprazole (PRILOSEC) 40 MG capsule TAKE 1 CAPSULE(40 MG) BY MOUTH DAILY   solifenacin (VESICARE) 5 MG tablet Take 1 tablet (5 mg total) by mouth daily.   No facility-administered encounter medications on file as of 05/14/2021.    Review of Systems  Constitutional:  Negative for appetite change, chills, fatigue, fever and unexpected weight change.       Had chills on Saturday   HENT:  Positive for ear pain, rhinorrhea, sinus pressure, sinus pain, sore throat and tinnitus. Negative for congestion, dental problem, ear discharge, facial swelling, hearing loss, nosebleeds, postnasal drip, sneezing and trouble swallowing.        Throat dry and scratchy   Eyes:  Negative for pain, discharge, redness, itching and visual disturbance.  Respiratory:  Negative for cough, chest tightness, shortness of breath and wheezing.   Cardiovascular:  Negative for chest pain, palpitations and leg swelling.  Gastrointestinal:  Negative for abdominal distention, abdominal pain, constipation, diarrhea, nausea and vomiting.  Genitourinary:  Negative for difficulty urinating, dysuria, flank pain, frequency and urgency.  Musculoskeletal:  Negative for arthralgias, back pain, gait problem, joint swelling, myalgias, neck pain and neck stiffness.  Skin:  Negative for color change, pallor, rash and wound.  Neurological:  Positive for dizziness. Negative for syncope, speech difficulty, weakness, light-headedness, numbness and headaches.  Hematological:  Does not bruise/bleed easily.  Psychiatric/Behavioral:  Negative for agitation, behavioral problems, confusion, hallucinations, self-injury, sleep disturbance and suicidal ideas. The patient is not nervous/anxious.    Immunization History  Administered  Date(s) Administered   Fluad Quad(high Dose 65+) 04/01/2019, 04/19/2021   Influenza Whole 05/14/2011   Influenza, High Dose Seasonal PF 04/30/2017, 05/15/2018, 05/18/2020   PFIZER(Purple Top)SARS-COV-2 Vaccination 08/27/2019, 09/21/2019, 03/07/2020, 11/16/2020   Pneumococcal Conjugate-13 08/30/2018   Pneumococcal Polysaccharide-23 03/21/2009, 04/20/2020   Td 12/04/2009   Zoster, Live 11/29/2009   Pertinent  Health Maintenance Due  Topic Date Due   DEXA SCAN  Never done   MAMMOGRAM  04/16/2023   INFLUENZA VACCINE  Completed   Fall Risk 01/17/2021 01/18/2021 01/25/2021 04/19/2021 05/14/2021  Falls in the past year? - - 0 0 0  Was there an injury with Fall? - - 0 0 0  Fall Risk Category Calculator - - 0 0 0  Fall Risk Category - - Low Low Low  Patient Fall Risk Level High fall risk High fall risk Low fall risk Low fall risk Low fall risk  Patient at Risk for Falls Due to - - No Fall Risks No Fall Risks No Fall Risks  Patient at Risk for Falls Due to - - - - -  Fall risk Follow up - - Falls evaluation completed Falls evaluation completed Falls evaluation completed   Functional Status  Survey:    Vitals:   05/14/21 1554  BP: 140/90  Pulse: 83  Resp: 16  Temp: 97.8 F (36.6 C)  SpO2: 98%  Weight: 182 lb (82.6 kg)  Height: 5\' 2"  (1.575 m)   Body mass index is 33.29 kg/m. Physical Exam Vitals reviewed.  Constitutional:      General: She is not in acute distress.    Appearance: Normal appearance. She is normal weight. She is not ill-appearing or diaphoretic.  HENT:     Head: Normocephalic.     Right Ear: Tympanic membrane, ear canal and external ear normal. There is no impacted cerumen.     Left Ear: Tympanic membrane, ear canal and external ear normal. There is no impacted cerumen.     Nose: No congestion or rhinorrhea.     Right Sinus: No maxillary sinus tenderness or frontal sinus tenderness.     Left Sinus: Maxillary sinus tenderness present. No frontal sinus tenderness.      Mouth/Throat:     Mouth: Mucous membranes are moist.     Pharynx: Oropharynx is clear. No oropharyngeal exudate or posterior oropharyngeal erythema.  Eyes:     General: No scleral icterus.       Right eye: No discharge.        Left eye: No discharge.     Extraocular Movements: Extraocular movements intact.     Conjunctiva/sclera: Conjunctivae normal.     Pupils: Pupils are equal, round, and reactive to light.  Neck:     Vascular: No carotid bruit.  Cardiovascular:     Rate and Rhythm: Normal rate and regular rhythm.     Pulses: Normal pulses.     Heart sounds: Normal heart sounds. No murmur heard.   No friction rub. No gallop.  Pulmonary:     Effort: Pulmonary effort is normal. No respiratory distress.     Breath sounds: Normal breath sounds. No wheezing, rhonchi or rales.  Chest:     Chest wall: No tenderness.  Abdominal:     General: Bowel sounds are normal. There is no distension.     Palpations: Abdomen is soft. There is no mass.     Tenderness: There is no abdominal tenderness. There is no right CVA tenderness, left CVA tenderness, guarding or rebound.  Musculoskeletal:        General: No swelling or tenderness. Normal range of motion.     Cervical back: Normal range of motion. No rigidity or tenderness.     Right lower leg: No edema.     Left lower leg: No edema.     Comments: Unsteady gait walks with a cane   Lymphadenopathy:     Cervical: No cervical adenopathy.  Skin:    General: Skin is warm and dry.     Coloration: Skin is not pale.     Findings: No bruising, erythema, lesion or rash.  Neurological:     Mental Status: She is alert and oriented to person, place, and time.     Cranial Nerves: No cranial nerve deficit.     Sensory: No sensory deficit.     Motor: No weakness.     Coordination: Coordination normal.     Gait: Gait abnormal.  Psychiatric:        Mood and Affect: Mood normal.        Speech: Speech normal.        Behavior: Behavior normal.         Thought Content: Thought content normal.  Judgment: Judgment normal.    Labs reviewed: Recent Labs    01/07/21 1122 01/18/21 0243 01/25/21 1527  NA 138 140 141  K 4.0 4.3 4.3  CL 106 108 108  CO2 26 27 24   GLUCOSE 94 121* 85  BUN 20 16 20   CREATININE 0.75 0.60 0.59*  CALCIUM 9.4 8.9 9.4   Recent Labs    10/02/20 1607 11/19/20 0945 01/07/21 1122 01/25/21 1527  AST 28 24 21  32  ALT 69* 25 20 50*  ALKPHOS 161*  --  113  --   BILITOT <0.2 0.4 0.5 0.4  PROT 6.1 6.1 6.7 5.7*  ALBUMIN 4.2  --  4.5  --    Recent Labs    10/02/20 1607 11/19/20 0945 01/07/21 1122 01/18/21 0243 01/25/21 1527  WBC 9.4 3.8 5.5 11.4* 6.9  NEUTROABS 7.6* 2,778  --   --  5,258  HGB 13.4 14.0 13.6 11.1* 11.4*  HCT 39.8 42.8 42.0 35.4* 35.7  MCV 88 89.7 91.5 91.9 89.7  PLT 223 183 217 194 303   Lab Results  Component Value Date   TSH 4.46 11/19/2020   No results found for: HGBA1C Lab Results  Component Value Date   CHOL 211 (H) 04/16/2021   HDL 48 (L) 04/16/2021   LDLCALC 129 (H) 04/16/2021   LDLDIRECT 130.2 11/03/2011   TRIG 196 (H) 04/16/2021   CHOLHDL 4.4 04/16/2021    Significant Diagnostic Results in last 30 days:  MM 3D SCREEN BREAST BILATERAL  Result Date: 04/19/2021 CLINICAL DATA:  Screening. EXAM: DIGITAL SCREENING BILATERAL MAMMOGRAM WITH TOMOSYNTHESIS AND CAD TECHNIQUE: Bilateral screening digital craniocaudal and mediolateral oblique mammograms were obtained. Bilateral screening digital breast tomosynthesis was performed. The images were evaluated with computer-aided detection. COMPARISON:  Previous exam(s). ACR Breast Density Category b: There are scattered areas of fibroglandular density. FINDINGS: There are no findings suspicious for malignancy. IMPRESSION: No mammographic evidence of malignancy. A result letter of this screening mammogram will be mailed directly to the patient. RECOMMENDATION: Screening mammogram in one year. (Code:SM-B-01Y) BI-RADS CATEGORY  1:  Negative. Electronically Signed   By: Evangeline Dakin M.D.   On: 04/19/2021 10:16    Assessment/Plan 1. Acute recurrent maxillary sinusitis Afebrile Left maxillary tender to palpation on exam. Will start on Augmentin as below side effects discussed advised to take a probiotics while on antibiotics. - continue on Allergra  - encouraged to increase fluid intake  - amoxicillin-clavulanate (AUGMENTIN) 875-125 MG tablet; Take 1 tablet by mouth 2 (two) times daily.  Dispense: 20 tablet; Refill: 0  2. Vertigo Recurrent meclizine effective in the past  Fall and safety precaution  - meclizine (ANTIVERT) 25 MG tablet; Take 1 tablet (25 mg total) by mouth 3 (three) times daily as needed for dizziness.  Dispense: 30 tablet; Refill: 0  Family/ staff Communication: Reviewed plan of care with patient verbalized understanding  Labs/tests ordered: None   Next Appointment: As needed if symptoms worsen or fail to improve    Sandrea Hughs, NP

## 2021-05-14 NOTE — Patient Instructions (Signed)
- Notify provider if symptoms worsen or fail to improve.  Sinusitis, Adult Sinusitis is soreness and swelling (inflammation) of your sinuses. Sinuses are hollow spaces in the bones around your face. They are located: Around your eyes. In the middle of your forehead. Behind your nose. In your cheekbones. Your sinuses and nasal passages are lined with a fluid called mucus. Mucus drains out of your sinuses. Swelling can trap mucus in your sinuses. This lets germs (bacteria, virus, or fungus) grow, which leads to infection. Most of the time, this condition is caused by a virus. What are the causes? This condition is caused by: Allergies. Asthma. Germs. Things that block your nose or sinuses. Growths in the nose (nasal polyps). Chemicals or irritants in the air. Fungus (rare). What increases the risk? You are more likely to develop this condition if: You have a weak body defense system (immune system). You do a lot of swimming or diving. You use nasal sprays too much. You smoke. What are the signs or symptoms? The main symptoms of this condition are pain and a feeling of pressure around the sinuses. Other symptoms include: Stuffy nose (congestion). Runny nose (drainage). Swelling and warmth in the sinuses. Headache. Toothache. A cough that may get worse at night. Mucus that collects in the throat or the back of the nose (postnasal drip). Being unable to smell and taste. Being very tired (fatigue). A fever. Sore throat. Bad breath. How is this diagnosed? This condition is diagnosed based on: Your symptoms. Your medical history. A physical exam. Tests to find out if your condition is short-term (acute) or long-term (chronic). Your doctor may: Check your nose for growths (polyps). Check your sinuses using a tool that has a light (endoscope). Check for allergies or germs. Do imaging tests, such as an MRI or CT scan. How is this treated? Treatment for this condition depends on  the cause and whether it is short-term or long-term. If caused by a virus, your symptoms should go away on their own within 10 days. You may be given medicines to relieve symptoms. They include: Medicines that shrink swollen tissue in the nose. Medicines that treat allergies (antihistamines). A spray that treats swelling of the nostrils.  Rinses that help get rid of thick mucus in your nose (nasal saline washes). If caused by bacteria, your doctor may wait to see if you will get better without treatment. You may be given antibiotic medicine if you have: A very bad infection. A weak body defense system. If caused by growths in the nose, you may need to have surgery. Follow these instructions at home: Medicines Take, use, or apply over-the-counter and prescription medicines only as told by your doctor. These may include nasal sprays. If you were prescribed an antibiotic medicine, take it as told by your doctor. Do not stop taking the antibiotic even if you start to feel better. Hydrate and humidify  Drink enough water to keep your pee (urine) pale yellow. Use a cool mist humidifier to keep the humidity level in your home above 50%. Breathe in steam for 10-15 minutes, 3-4 times a day, or as told by your doctor. You can do this in the bathroom while a hot shower is running. Try not to spend time in cool or dry air. Rest Rest as much as you can. Sleep with your head raised (elevated). Make sure you get enough sleep each night. General instructions  Put a warm, moist washcloth on your face 3-4 times a day, or as  often as told by your doctor. This will help with discomfort. Wash your hands often with soap and water. If there is no soap and water, use hand sanitizer. Do not smoke. Avoid being around people who are smoking (secondhand smoke). Keep all follow-up visits as told by your doctor. This is important. Contact a doctor if: You have a fever. Your symptoms get worse. Your symptoms do  not get better within 10 days. Get help right away if: You have a very bad headache. You cannot stop throwing up (vomiting). You have very bad pain or swelling around your face or eyes. You have trouble seeing. You feel confused. Your neck is stiff. You have trouble breathing. Summary Sinusitis is swelling of your sinuses. Sinuses are hollow spaces in the bones around your face. This condition is caused by tissues in your nose that become inflamed or swollen. This traps germs. These can lead to infection. If you were prescribed an antibiotic medicine, take it as told by your doctor. Do not stop taking it even if you start to feel better. Keep all follow-up visits as told by your doctor. This is important. This information is not intended to replace advice given to you by your health care provider. Make sure you discuss any questions you have with your health care provider. Document Revised: 12/07/2017 Document Reviewed: 12/07/2017 Elsevier Patient Education  2022 [employer].

## 2021-05-21 ENCOUNTER — Other Ambulatory Visit: Payer: Self-pay

## 2021-05-21 ENCOUNTER — Encounter (HOSPITAL_BASED_OUTPATIENT_CLINIC_OR_DEPARTMENT_OTHER): Payer: Self-pay | Admitting: Physical Therapy

## 2021-05-21 ENCOUNTER — Ambulatory Visit (HOSPITAL_BASED_OUTPATIENT_CLINIC_OR_DEPARTMENT_OTHER): Payer: Medicare Other | Attending: Neurology | Admitting: Physical Therapy

## 2021-05-21 DIAGNOSIS — R262 Difficulty in walking, not elsewhere classified: Secondary | ICD-10-CM | POA: Diagnosis not present

## 2021-05-21 DIAGNOSIS — M542 Cervicalgia: Secondary | ICD-10-CM | POA: Insufficient documentation

## 2021-05-21 DIAGNOSIS — M6281 Muscle weakness (generalized): Secondary | ICD-10-CM | POA: Diagnosis not present

## 2021-05-21 DIAGNOSIS — M545 Low back pain, unspecified: Secondary | ICD-10-CM | POA: Insufficient documentation

## 2021-05-21 DIAGNOSIS — R252 Cramp and spasm: Secondary | ICD-10-CM | POA: Insufficient documentation

## 2021-05-21 DIAGNOSIS — G8929 Other chronic pain: Secondary | ICD-10-CM | POA: Diagnosis not present

## 2021-05-21 NOTE — Therapy (Addendum)
[place], [place], [address] Phone: [phone]   Fax:  [phone]  Physical Therapy Treatment/Progress note   Patient Details  Name: Dawn [name] MRN: [id number] Date of Birth: [birth year] Referring Provider (PT): Dr Arlice Colt  Progress Note Reporting Period 03/13/2021 to 05/21/2021  See note below for Objective Data and Assessment of Progress/Goals.    .     Encounter Date: 05/21/2021   PT End of Session - 05/21/21 1154     Visit Number 10    Number of Visits 12    Date for PT Re-Evaluation 07/02/21    Authorization Type progress note on visit 20th    PT Start Time 1145    PT Stop Time 1226    PT Time Calculation (min) 41 min    Activity Tolerance Patient tolerated treatment well    Behavior During Therapy [facility] for tasks assessed/performed             Past Medical History:  Diagnosis Date   Allergy    Dry eyes    Endometrial polyp    Essential hypertension 08/25/2018   GERD (gastroesophageal reflux disease)    History of hiatal hernia    History of kidney stones    Hot flashes, menopausal 10/13/2011   Estradiol started    Jaundice as teenager   no problems since   Memory loss 09/21/2019     1/2 of feet numb all the time   Multiple sclerosis ([place]) dx 2001   Neuropathy    Neuropathy    bilateral feet   Osteoarthritis    Sjogren's syndrome ([place]) dx oct 2021   sore muscvles, dry mouth and eyes   Stroke [facility])    Vertigo    Vision abnormalities     Past Surgical History:  Procedure Laterality Date   ABDOMINAL HYSTERECTOMY  2002   partial   colonscopy  2011   DILATATION & CURETTAGE/HYSTEROSCOPY WITH MYOSURE N/A 06/08/2020   Procedure: DILATATION & CURETTAGE/HYSTEROSCOPY/Polypectomy WITH MYOSURE;  Surgeon: Armandina Stammer, DO;  Location[facility];  Service: Gynecology;  Laterality: N/A;   LUMBAR FUSION  2001   TOTAL HIP ARTHROPLASTY Right 01/17/2021    Procedure: TOTAL HIP ARTHROPLASTY ANTERIOR APPROACH;  Surgeon: Rod Can, MD;  [facility];  Service: Orthopedics;  Laterality: Right;   UPPER GI ENDOSCOPY  yrs ago    There were no vitals filed for this visit.   Subjective Assessment - 05/21/21 1148     Subjective Patient reports she had vertigo last week, so she has been sitting upright more. It has made herback a little ore sore. She flet great after the last visit.    Pertinent History MS    How long can you stand comfortably? hurts as soon as she stands. Has to shift frequently    How long can you walk comfortably? limited community ambualtion    Diagnostic tests Nothing recent    Patient Stated Goals to go home    Currently in Pain? Yes    Pain Score 4     Pain Location Back    Pain Orientation Right    Pain Descriptors / Indicators Aching    Pain Type Chronic pain    Pain Radiating Towards into the left    Pain Onset More than a month ago    Pain Frequency Constant    Aggravating Factors  standing and walking    Pain Relieving Factors light exercises  Effect of Pain on Daily Activities difficulty walking and standing    Multiple Pain Sites No                [facility] PT Assessment - 05/21/21 0001       Assessment   Medical Diagnosis Low Back Pain/ Right THA/ Gait distuebance    Referring Provider (PT) Dr Arlice Colt      PROM   Right Hip Flexion 110    Left Hip Flexion 110      Strength   Right Hip Flexion 4+/5    Right Hip ABduction 4+/5    Left Hip Flexion 4+/5    Left Hip ABduction 4+/5    Right Knee Flexion 4+/5    Right Knee Extension 4+/5    Left Knee Flexion 4+/5    Left Knee Extension 4+/5      Palpation   Palpation comment improved tendwrness to palpation but still tender in the gluteal and sacral area .      Transfers   Comments 49                           [facility] Adult PT Treatment/Exercise - 05/21/21 0001       Neck Exercises: Seated   Other Seated  Exercise seated shoulder flexion 2x10; bilateral er 2x10 red; horizontal abduction 2x10 red      Lumbar Exercises: Standing   Heel Raises 15 reps   halted 2nd to vertigo     Lumbar Exercises: Seated   LAQ on Chair Weights (lbs) x20 red band    Other Seated Lumbar Exercises seated march 2x15    Other Seated Lumbar Exercises seated clamshell with band 3x10 red;      Manual Therapy   Manual therapy comments skilled palaption of trigger points    Soft tissue mobilization trigger point release to the upper gluteal and lumbar paraspinal. roller to bilateral gluteals in Prone    Manual Traction LAD Grade II-III left hip pain              Trigger Point Dry Needling - 05/21/21 0001     Consent Given? Yes    Education Handout Provided Previously provided    Muscles Treated Back/Hip Lumbar multifidi    Other Dry Needling 2 spots in left gluteal and 2 spots in the lower lumbar left paraspinals 1 spot int he right gluteal    Gluteus Medius Response Twitch response elicited    Lumbar multifidi Response Twitch response elicited;Palpable increased muscle length                   PT Education - 05/21/21 1152     Education Details reviewed HEP    Person(s) Educated Patient    Methods Explanation;Tactile cues;Demonstration;Verbal cues    Comprehension Verbal cues required;Verbalized understanding;Returned demonstration;Tactile cues required              PT Short Term Goals - 03/14/21 1352       PT SHORT TERM GOAL #1   Title Patient will report a 50% reduction in lower back pain    Time 4    Period Weeks    Status New    Target Date 01/17/21      PT SHORT TERM GOAL #2   Title Patient will increase distance on 6 min walk test by 200'    Time 4    Period Weeks    Status New  Target Date 04/11/21      PT SHORT TERM GOAL #3   Title Patient increase right hip flexion strength to 4+/5    Time 4    Period Weeks    Status New    Target Date 04/11/21                PT Long Term Goals - 03/14/21 1356       PT LONG TERM GOAL #1   Title Patient will return to [facility] with a full program for her hip    Time 12    Period Weeks    Status New    Target Date 06/06/21      PT LONG TERM GOAL #2   Title Patient will ambualte 1500' on the 6 min walk test in order to improve community ambualtion    Time 12    Period Weeks    Status New    Target Date 06/06/21      PT LONG TERM GOAL #3   Title Patient will stand for 30 min without a significant increase in pain in order to perfrom ADL's    Time 12    Period Weeks    Status New    Target Date 06/06/21                   Plan - 05/21/21 1212     Clinical Impression Statement Patient had spasming in her right gluteal and right sided lumbar spine. Therapy performed manual therapy and TPDN to the area. She flet looser after. She perfromed exercises in seated 2nd to vertigo. Her vertigo has improved but it is still there. Overall she is making some progress. Her pain overall has improved in her lower back. her hip is doing great. She is having very little pain in her hip. Her hip ROm has improved bilateral as well as her hip strength. We will continue 1W8 to work on strengthening and exercises. We will continue to review the pool program.    Personal Factors and Comorbidities Comorbidity 1;Comorbidity 2    Comorbidities MS, OA    Examination-Activity Limitations Bed Mobility    Examination-Participation Restrictions Meal Prep;Cleaning;Community Activity;Laundry;Shop    Stability/Clinical Decision Making Stable/Uncomplicated    Clinical Decision Making Low    Rehab Potential Good    PT Frequency 1x / week    PT Duration 8 weeks    PT Treatment/Interventions ADLs/Self Care Home Management;Electrical Stimulation;Cryotherapy;Iontophoresis 4mg /ml Dexamethasone;Moist Heat;Traction;DME Instruction;Neuromuscular re-education;Patient/family education;Manual techniques;Passive range of  motion;Taping;Therapeutic activities;Therapeutic exercise;Balance training;Gait training;Stair training;Functional mobility training;Dry needling;Ultrasound    PT Next Visit Plan continue with pool and land strengthening, manual therapy to back    PT Home Exercise Plan reviewed seated hamstring stretch and LTR. She has done those in the past.    Consulted and Agree with Plan of Care Patient             Patient will benefit from skilled therapeutic intervention in order to improve the following deficits and impairments:  Abnormal gait, Difficulty walking, Decreased range of motion, Decreased mobility, Decreased strength, Postural dysfunction, Pain, Increased muscle spasms  Visit Diagnosis: Chronic bilateral low back pain without sciatica  Difficulty in walking, not elsewhere classified  Muscle weakness (generalized)     Problem List Patient Active Problem List   Diagnosis Date Noted   Statin intolerance 04/19/2021   Osteoarthritis of right hip 01/17/2021   Status post THR (total hip replacement) 01/17/2021   Chronic bilateral low back pain  without sciatica 10/02/2020   Chronic pain syndrome 06/20/2020   Post laminectomy syndrome 06/20/2020   Sjogren's disease ([place]) 05/23/2020   Primary osteoarthritis of both hands 05/23/2020   Primary osteoarthritis of both feet 05/23/2020   Sacroiliitis, not elsewhere classified ([place]) 12/22/2019   Other secondary scoliosis, lumbar region 12/22/2019   Spondylosis without myelopathy or radiculopathy, lumbar region 12/22/2019   Cervical radiculopathy 10/18/2019   Numbness 09/21/2019   Bilateral carpal tunnel syndrome 09/21/2019   High risk medication use 09/21/2019   Memory loss 09/21/2019   Essential hypertension 08/25/2018   Abnormal SPEP 02/19/2018   Hand pain 02/20/2017   Multiple joint pain 02/18/2017   Right elbow pain 12/09/2016   Disturbed cognition 06/25/2016   Trochanteric bursitis of left hip 02/18/2016   Sciatica, right side  10/30/2015   Bilateral arm pain 10/10/2015   Trochanteric bursitis of both hips 08/04/2014   Chronic fatigue 08/04/2014   Urinary frequency 08/04/2014   Abnormality of gait 08/04/2014   Unspecified visual disturbance 01/31/2013   Transient vision disturbance 01/31/2013   Routine general medical examination at a health care facility 11/04/2011   Colon polyps 11/03/2011   Hot flashes, menopausal 10/13/2011   POSTHERPETIC NEURALGIA 10/03/2009   DISPLCMT LUMBAR INTERVERT DISC W/O MYELOPATHY 09/05/2009   RENAL CALCULUS, RECURRENT 09/04/2009   Hyperlipidemia 08/31/2009   MULTIPLE SCLEROSIS, PROGRESSIVE/RELAPSING 08/31/2009   ALLERGIC RHINITIS 08/31/2009    Carney Living, PT 05/21/2021, 3:01 PM  [facility] [place], [place], [address] Phone: [phone]   Fax:  [phone]  Name: Dawn [name] MRN: [id number] Date of Birth: May 06, 1946

## 2021-05-22 DIAGNOSIS — R42 Dizziness and giddiness: Secondary | ICD-10-CM | POA: Diagnosis not present

## 2021-05-22 DIAGNOSIS — J300 Vasomotor rhinitis: Secondary | ICD-10-CM | POA: Diagnosis not present

## 2021-05-22 DIAGNOSIS — J3089 Other allergic rhinitis: Secondary | ICD-10-CM | POA: Diagnosis not present

## 2021-05-23 ENCOUNTER — Ambulatory Visit: Payer: Medicare Other | Admitting: Rheumatology

## 2021-05-23 DIAGNOSIS — Z8261 Family history of arthritis: Secondary | ICD-10-CM

## 2021-05-23 DIAGNOSIS — M4156 Other secondary scoliosis, lumbar region: Secondary | ICD-10-CM

## 2021-05-23 DIAGNOSIS — M3509 Sicca syndrome with other organ involvement: Secondary | ICD-10-CM

## 2021-05-23 DIAGNOSIS — M5030 Other cervical disc degeneration, unspecified cervical region: Secondary | ICD-10-CM

## 2021-05-23 DIAGNOSIS — G35 Multiple sclerosis: Secondary | ICD-10-CM

## 2021-05-23 DIAGNOSIS — M19042 Primary osteoarthritis, left hand: Secondary | ICD-10-CM

## 2021-05-23 DIAGNOSIS — M19071 Primary osteoarthritis, right ankle and foot: Secondary | ICD-10-CM

## 2021-05-23 DIAGNOSIS — I10 Essential (primary) hypertension: Secondary | ICD-10-CM

## 2021-05-23 DIAGNOSIS — M19041 Primary osteoarthritis, right hand: Secondary | ICD-10-CM

## 2021-05-23 DIAGNOSIS — M5136 Other intervertebral disc degeneration, lumbar region: Secondary | ICD-10-CM

## 2021-05-23 DIAGNOSIS — M47816 Spondylosis without myelopathy or radiculopathy, lumbar region: Secondary | ICD-10-CM

## 2021-05-23 DIAGNOSIS — M7061 Trochanteric bursitis, right hip: Secondary | ICD-10-CM

## 2021-05-24 ENCOUNTER — Other Ambulatory Visit: Payer: Self-pay

## 2021-05-24 ENCOUNTER — Ambulatory Visit
Admission: RE | Admit: 2021-05-24 | Discharge: 2021-05-24 | Disposition: A | Discharge status: Home / Self Care | Payer: Medicare Other | Source: Ambulatory Visit | Attending: Physical Medicine and Rehabilitation | Admitting: Physical Medicine and Rehabilitation

## 2021-05-24 DIAGNOSIS — M5440 Lumbago with sciatica, unspecified side: Secondary | ICD-10-CM

## 2021-05-24 DIAGNOSIS — M48061 Spinal stenosis, lumbar region without neurogenic claudication: Secondary | ICD-10-CM | POA: Diagnosis not present

## 2021-05-24 DIAGNOSIS — M545 Low back pain, unspecified: Secondary | ICD-10-CM | POA: Diagnosis not present

## 2021-05-24 DIAGNOSIS — R29898 Other symptoms and signs involving the musculoskeletal system: Secondary | ICD-10-CM | POA: Diagnosis not present

## 2021-05-27 ENCOUNTER — Encounter (HOSPITAL_BASED_OUTPATIENT_CLINIC_OR_DEPARTMENT_OTHER): Payer: Self-pay | Admitting: Physical Therapy

## 2021-05-27 ENCOUNTER — Other Ambulatory Visit: Payer: Self-pay

## 2021-05-27 ENCOUNTER — Ambulatory Visit (HOSPITAL_BASED_OUTPATIENT_CLINIC_OR_DEPARTMENT_OTHER): Payer: Medicare Other | Admitting: Physical Therapy

## 2021-05-27 DIAGNOSIS — M6281 Muscle weakness (generalized): Secondary | ICD-10-CM | POA: Diagnosis not present

## 2021-05-27 DIAGNOSIS — R262 Difficulty in walking, not elsewhere classified: Secondary | ICD-10-CM | POA: Diagnosis not present

## 2021-05-27 DIAGNOSIS — R252 Cramp and spasm: Secondary | ICD-10-CM

## 2021-05-27 DIAGNOSIS — M542 Cervicalgia: Secondary | ICD-10-CM | POA: Diagnosis not present

## 2021-05-27 DIAGNOSIS — M545 Low back pain, unspecified: Secondary | ICD-10-CM | POA: Diagnosis not present

## 2021-05-27 DIAGNOSIS — G8929 Other chronic pain: Secondary | ICD-10-CM

## 2021-05-27 NOTE — Therapy (Signed)
[facility] [address] [place], [place], [address] Phone: [phone]   Fax:  [phone]  Physical Therapy Treatment  Patient Details  Name: Dawn [name] MRN: [id number] Date of Birth: May 04, 1946 Referring Provider (PT): Dr Arlice Colt   Encounter Date: 05/27/2021   PT End of Session - 05/27/21 1109     Visit Number 11    Number of Visits 18    Date for PT Re-Evaluation 07/23/21    Authorization Type progress note on visit 20th    PT Start Time 1100    PT Stop Time 1143    PT Time Calculation (min) 43 min    Activity Tolerance Patient tolerated treatment well    Behavior During Therapy [facility] for tasks assessed/performed             Past Medical History:  Diagnosis Date   Allergy    Dry eyes    Endometrial polyp    Essential hypertension 08/25/2018   GERD (gastroesophageal reflux disease)    History of hiatal hernia    History of kidney stones    Hot flashes, menopausal 10/13/2011   Estradiol started    Jaundice as teenager   no problems since   Memory loss 09/21/2019     1/2 of feet numb all the time   Multiple sclerosis ([place]) dx 2001   Neuropathy    Neuropathy    bilateral feet   Osteoarthritis    Sjogren's syndrome ([place]) dx oct 2021   sore muscvles, dry mouth and eyes   Stroke [facility])    Vertigo    Vision abnormalities     Past Surgical History:  Procedure Laterality Date   ABDOMINAL HYSTERECTOMY  2002   partial   colonscopy  2011   DILATATION & CURETTAGE/HYSTEROSCOPY WITH MYOSURE N/A 06/08/2020   Procedure: DILATATION & CURETTAGE/HYSTEROSCOPY/Polypectomy WITH MYOSURE;  Surgeon: Armandina Stammer, DO;  Location: [facility];  Service: Gynecology;  Laterality: N/A;   LUMBAR FUSION  2001   TOTAL HIP ARTHROPLASTY Right 01/17/2021   Procedure: TOTAL HIP ARTHROPLASTY ANTERIOR APPROACH;  Surgeon: Rod Can, MD;  Location[facility];  Service: Orthopedics;  Laterality: Right;    UPPER GI ENDOSCOPY  yrs ago    There were no vitals filed for this visit.   Subjective Assessment - 05/27/21 1107     Subjective Patient reports her back is a little sore, but otherwise it is still better. She reports some stiffness in the mornings.    Pertinent History MS    How long can you stand comfortably? hurts as soon as she stands. Has to shift frequently    How long can you walk comfortably? limited community ambualtion    Diagnostic tests Nothing recent    Patient Stated Goals to go home    Currently in Pain? Yes    Pain Score 4     Pain Location Back    Pain Orientation Right    Pain Descriptors / Indicators Aching    Pain Type Chronic pain    Pain Onset More than a month ago    Pain Frequency Constant    Aggravating Factors  stadig and walking    Pain Relieving Factors light exercises    Effect of Pain on Daily Activities difficulty walking and standing    Multiple Pain Sites No  [place] Adult PT Treatment/Exercise - 05/28/21 0001       Lumbar Exercises: Stretches   Lower Trunk Rotation Limitations x20    Piriformis Stretch 2 reps;20 seconds      Lumbar Exercises: Standing   Other Standing Lumbar Exercises standing hip flexion x15; hip abduction 2x15; heel raise 2x20; extensions 2x10 bilateral      Manual Therapy   Manual therapy comments skilled palaption of trigger points    Soft tissue mobilization trigger point release to the upper gluteal and lumbar paraspinal. roller to bilateral gluteals in Prone    Manual Traction LAD Grade II-III left hip pain                     PT Education - 05/27/21 1108     Education Details HEP and symptom management    Person(s) Educated Patient    Methods Explanation;Demonstration;Tactile cues;Verbal cues    Comprehension Verbalized understanding;Returned demonstration;Verbal cues required;Tactile cues required              PT Short Term Goals - 03/14/21  1352       PT SHORT TERM GOAL #1   Title Patient will report a 50% reduction in lower back pain    Time 4    Period Weeks    Status New    Target Date 01/17/21      PT SHORT TERM GOAL #2   Title Patient will increase distance on 6 min walk test by 200'    Time 4    Period Weeks    Status New    Target Date 04/11/21      PT SHORT TERM GOAL #3   Title Patient increase right hip flexion strength to 4+/5    Time 4    Period Weeks    Status New    Target Date 04/11/21               PT Long Term Goals - 03/14/21 1356       PT LONG TERM GOAL #1   Title Patient will return to [facility] with a full program for her hip    Time 12    Period Weeks    Status New    Target Date 06/06/21      PT LONG TERM GOAL #2   Title Patient will ambualte 1500' on the 6 min walk test in order to improve community ambualtion    Time 12    Period Weeks    Status New    Target Date 06/06/21      PT LONG TERM GOAL #3   Title Patient will stand for 30 min without a significant increase in pain in order to perfrom ADL's    Time 12    Period Weeks    Status New    Target Date 06/06/21                   Plan - 05/27/21 1131     Clinical Impression Statement Therapy focused on manual therapy to the left posterior and anterior hip. Therapy eprfroemd a roller on her left anterior hip. She was also shown a thomas stretch for the left. She also was able to perfrom standing exercises for her HEP.    Personal Factors and Comorbidities Comorbidity 1;Comorbidity 2    Comorbidities MS, OA    Examination-Activity Limitations Bed Mobility    Examination-Participation Restrictions Meal Prep;Cleaning;Community Activity;Laundry;Shop    Stability/Clinical Decision Making Stable/Uncomplicated  Clinical Decision Making Low    Rehab Potential Good    PT Frequency 1x / week    PT Duration 8 weeks    PT Treatment/Interventions ADLs/Self Care Home Management;Electrical  Stimulation;Cryotherapy;Iontophoresis 4mg /ml Dexamethasone;Moist Heat;Traction;DME Instruction;Neuromuscular re-education;Patient/family education;Manual techniques;Passive range of motion;Taping;Therapeutic activities;Therapeutic exercise;Balance training;Gait training;Stair training;Functional mobility training;Dry needling;Ultrasound    PT Next Visit Plan continue with pool and land strengthening, manual therapy to back    PT Home Exercise Plan reviewed seated hamstring stretch and LTR. She has done those in the past.             Patient will benefit from skilled therapeutic intervention in order to improve the following deficits and impairments:  Abnormal gait, Difficulty walking, Decreased range of motion, Decreased mobility, Decreased strength, Postural dysfunction, Pain, Increased muscle spasms  Visit Diagnosis: Chronic bilateral low back pain without sciatica  Difficulty in walking, not elsewhere classified  Muscle weakness (generalized)  Cervicalgia  Cramp and spasm     Problem List Patient Active Problem List   Diagnosis Date Noted   Statin intolerance 04/19/2021   Osteoarthritis of right hip 01/17/2021   Status post THR (total hip replacement) 01/17/2021   Chronic bilateral low back pain without sciatica 10/02/2020   Chronic pain syndrome 06/20/2020   Post laminectomy syndrome 06/20/2020   Sjogren's disease ([place]) 05/23/2020   Primary osteoarthritis of both hands 05/23/2020   Primary osteoarthritis of both feet 05/23/2020   Sacroiliitis, not elsewhere classified ([place]) 12/22/2019   Other secondary scoliosis, lumbar region 12/22/2019   Spondylosis without myelopathy or radiculopathy, lumbar region 12/22/2019   Cervical radiculopathy 10/18/2019   Numbness 09/21/2019   Bilateral carpal tunnel syndrome 09/21/2019   High risk medication use 09/21/2019   Memory loss 09/21/2019   Essential hypertension 08/25/2018   Abnormal SPEP 02/19/2018   Hand pain 02/20/2017    Multiple joint pain 02/18/2017   Right elbow pain 12/09/2016   Disturbed cognition 06/25/2016   Trochanteric bursitis of left hip 02/18/2016   Sciatica, right side 10/30/2015   Bilateral arm pain 10/10/2015   Trochanteric bursitis of both hips 08/04/2014   Chronic fatigue 08/04/2014   Urinary frequency 08/04/2014   Abnormality of gait 08/04/2014   Unspecified visual disturbance 01/31/2013   Transient vision disturbance 01/31/2013   Routine general medical examination at a health care facility 11/04/2011   Colon polyps 11/03/2011   Hot flashes, menopausal 10/13/2011   POSTHERPETIC NEURALGIA 10/03/2009   DISPLCMT LUMBAR INTERVERT DISC W/O MYELOPATHY 09/05/2009   RENAL CALCULUS, RECURRENT 09/04/2009   Hyperlipidemia 08/31/2009   MULTIPLE SCLEROSIS, PROGRESSIVE/RELAPSING 08/31/2009   ALLERGIC RHINITIS 08/31/2009    Carney Living, PT 05/28/2021, 1:55 PM  [facility] [address] [place], [place], [address] Phone: [phone]   Fax:  [phone]  Name: Dawn [name] MRN: [id number] Date of Birth: 1945-12-28

## 2021-05-28 DIAGNOSIS — Z96641 Presence of right artificial hip joint: Secondary | ICD-10-CM | POA: Diagnosis not present

## 2021-05-29 DIAGNOSIS — M47816 Spondylosis without myelopathy or radiculopathy, lumbar region: Secondary | ICD-10-CM | POA: Diagnosis not present

## 2021-06-03 ENCOUNTER — Ambulatory Visit (HOSPITAL_BASED_OUTPATIENT_CLINIC_OR_DEPARTMENT_OTHER): Payer: Medicare Other | Admitting: Physical Therapy

## 2021-06-03 DIAGNOSIS — M47816 Spondylosis without myelopathy or radiculopathy, lumbar region: Secondary | ICD-10-CM | POA: Diagnosis not present

## 2021-06-07 ENCOUNTER — Other Ambulatory Visit: Payer: Self-pay

## 2021-06-07 ENCOUNTER — Ambulatory Visit (HOSPITAL_BASED_OUTPATIENT_CLINIC_OR_DEPARTMENT_OTHER): Payer: Medicare Other | Admitting: Physical Therapy

## 2021-06-07 ENCOUNTER — Encounter (HOSPITAL_BASED_OUTPATIENT_CLINIC_OR_DEPARTMENT_OTHER): Payer: Self-pay | Admitting: Physical Therapy

## 2021-06-07 DIAGNOSIS — M6281 Muscle weakness (generalized): Secondary | ICD-10-CM | POA: Diagnosis not present

## 2021-06-07 DIAGNOSIS — R262 Difficulty in walking, not elsewhere classified: Secondary | ICD-10-CM

## 2021-06-07 DIAGNOSIS — M545 Low back pain, unspecified: Secondary | ICD-10-CM

## 2021-06-07 DIAGNOSIS — M542 Cervicalgia: Secondary | ICD-10-CM

## 2021-06-07 DIAGNOSIS — G8929 Other chronic pain: Secondary | ICD-10-CM

## 2021-06-07 DIAGNOSIS — R252 Cramp and spasm: Secondary | ICD-10-CM | POA: Diagnosis not present

## 2021-06-09 ENCOUNTER — Encounter (HOSPITAL_BASED_OUTPATIENT_CLINIC_OR_DEPARTMENT_OTHER): Payer: Self-pay | Admitting: Physical Therapy

## 2021-06-09 NOTE — Therapy (Signed)
[facility] [address] [place], [place], [address] Phone: [phone]   Fax:  [phone]  Physical Therapy Treatment  Patient Details  Name: CHIDERA DEARCOS MRN: [id number] Date of Birth: [birth year] Referring Provider (PT): Dr Arlice Colt   Encounter Date: 06/07/2021   PT End of Session - 06/09/21 0730     Visit Number 12    Number of Visits 18    Date for PT Re-Evaluation 07/23/21    Authorization Type progress note on visit 20th    PT Start Time 1016    PT Stop Time 1058    PT Time Calculation (min) 42 min    Activity Tolerance Patient tolerated treatment well    Behavior During Therapy [facility] for tasks assessed/performed             Past Medical History:  Diagnosis Date   Allergy    Dry eyes    Endometrial polyp    Essential hypertension 08/25/2018   GERD (gastroesophageal reflux disease)    History of hiatal hernia    History of kidney stones    Hot flashes, menopausal 10/13/2011   Estradiol started    Jaundice as teenager   no problems since   Memory loss 09/21/2019     1/2 of feet numb all the time   Multiple sclerosis ([place]) dx 2001   Neuropathy    Neuropathy    bilateral feet   Osteoarthritis    Sjogren's syndrome ([place]) dx oct 2021   sore muscvles, dry mouth and eyes   Stroke [facility])    Vertigo    Vision abnormalities     Past Surgical History:  Procedure Laterality Date   ABDOMINAL HYSTERECTOMY  2002   partial   colonscopy  2011   DILATATION & CURETTAGE/HYSTEROSCOPY WITH MYOSURE N/A 06/08/2020   Procedure: DILATATION & CURETTAGE/HYSTEROSCOPY/Polypectomy WITH MYOSURE;  Surgeon: Armandina Stammer, DO;  Location: [facility];  Service: Gynecology;  Laterality: N/A;   LUMBAR FUSION  2001   TOTAL HIP ARTHROPLASTY Right 01/17/2021   Procedure: TOTAL HIP ARTHROPLASTY ANTERIOR APPROACH;  Surgeon: Rod Can, MD;  Location: [facility];  Service: Orthopedics;  Laterality: Right;    UPPER GI ENDOSCOPY  yrs ago    There were no vitals filed for this visit.   Subjective Assessment - 06/09/21 0731     Subjective Patient has been back to the MD for an MRI. She has multi leval degeneration from L2 to L5. she had injections whih helped. She will be going back to the MD in 2 weeks for potential of a nerve abilation.    How long can you stand comfortably? hurts as soon as she stands. Has to shift frequently    How long can you walk comfortably? limited community ambualtion    Diagnostic tests Nothing recent    Patient Stated Goals to go home    Currently in Pain? Yes    Pain Score 4     Pain Location Back    Pain Orientation Right;Left    Pain Descriptors / Indicators Aching    Pain Type Chronic pain    Pain Onset More than a month ago    Pain Frequency Constant    Aggravating Factors  standing nd walking    Effect of Pain on Daily Activities difficulty walking and standing  [facility] Adult PT Treatment/Exercise - 06/09/21 0001       Neck Exercises: Seated   Other Seated Exercise seated hip abduction 2x10 red      Lumbar Exercises: Stretches   Lower Trunk Rotation Limitations x20    Piriformis Stretch 2 reps;20 seconds      Lumbar Exercises: Standing   Other Standing Lumbar Exercises standing hip flexion x15; hip abduction 2x15; heel raise 2x20; extensions 2x10 bilateral      Manual Therapy   Manual therapy comments skilled palaption of trigger points    Soft tissue mobilization trigger point release to the upper gluteal and lumbar paraspinal. roller to bilateral gluteals in [place]    Manual Traction LAD Grade II-III left hip pain                       PT Short Term Goals - 03/14/21 1352       PT SHORT TERM GOAL #1   Title Patient will report a 50% reduction in lower back pain    Time 4    Period Weeks    Status New    Target Date 01/17/21      PT SHORT TERM GOAL #2   Title Patient will  increase distance on 6 min walk test by 200'    Time 4    Period Weeks    Status New    Target Date 04/11/21      PT SHORT TERM GOAL #3   Title Patient increase right hip flexion strength to 4+/5    Time 4    Period Weeks    Status New    Target Date 04/11/21               PT Long Term Goals - 03/14/21 1356       PT LONG TERM GOAL #1   Title Patient will [facility] with a full program for her hip    Time 12    Period Weeks    Status New    Target Date 06/06/21      PT LONG TERM GOAL #2   Title Patient will ambualte 1500' on the 6 min walk test in order to improve community ambualtion    Time 12    Period Weeks    Status New    Target Date 06/06/21      PT LONG TERM GOAL #3   Title Patient will stand for 30 min without a significant increase in pain in order to perfrom ADL's    Time 12    Period Weeks    Status New    Target Date 06/06/21                   Plan - 06/09/21 0726     Clinical Impression Statement Patient has a new script for her lower back. her MRI shows severe degeeneration at several levels. She had injections which helped. Today therapy eprfromed manual therapy to her back. She also perfromed hip and core strengthening. She tolerated well. We will continue to focus on her bck hadn hips.    Personal Factors and Comorbidities Comorbidity 1;Comorbidity 2    Comorbidities MS, OA    Examination-Activity Limitations Bed Mobility    Examination-Participation Restrictions Meal Prep;Cleaning;Community Activity;Laundry;Shop    Stability/Clinical Decision Making Stable/Uncomplicated    Clinical Decision Making Low    Rehab Potential Good    PT Frequency 1x / week    PT  Duration 8 weeks    PT Treatment/Interventions ADLs/Self Care Home Management;Electrical Stimulation;Cryotherapy;Iontophoresis 4mg /ml Dexamethasone;Moist Heat;Traction;DME Instruction;Neuromuscular re-education;Patient/family education;Manual techniques;Passive range  of motion;Taping;Therapeutic activities;Therapeutic exercise;Balance training;Gait training;Stair training;Functional mobility training;Dry needling;Ultrasound    PT Next Visit Plan continue with pool and land strengthening, manual therapy to back    PT Home Exercise Plan reviewed seated hamstring stretch and LTR. She has done those in the past.    Consulted and Agree with Plan of Care Patient             Patient will benefit from skilled therapeutic intervention in order to improve the following deficits and impairments:  Abnormal gait, Difficulty walking, Decreased range of motion, Decreased mobility, Decreased strength, Postural dysfunction, Pain, Increased muscle spasms  Visit Diagnosis: Chronic bilateral low back pain without sciatica  Difficulty in walking, not elsewhere classified  Muscle weakness (generalized)  Cramp and spasm  Cervicalgia     Problem List Patient Active Problem List   Diagnosis Date Noted   Statin intolerance 04/19/2021   Osteoarthritis of right hip 01/17/2021   Status post THR (total hip replacement) 01/17/2021   Chronic bilateral low back pain without sciatica 10/02/2020   Chronic pain syndrome 06/20/2020   Post laminectomy syndrome 06/20/2020   Sjogren's disease ([place]) 05/23/2020   Primary osteoarthritis of both hands 05/23/2020   Primary osteoarthritis of both feet 05/23/2020   Sacroiliitis, not elsewhere classified ([place]) 12/22/2019   Other secondary scoliosis, lumbar region 12/22/2019   Spondylosis without myelopathy or radiculopathy, lumbar region 12/22/2019   Cervical radiculopathy 10/18/2019   Numbness 09/21/2019   Bilateral carpal tunnel syndrome 09/21/2019   High risk medication use 09/21/2019   Memory loss 09/21/2019   Essential hypertension 08/25/2018   Abnormal SPEP 02/19/2018   Hand pain 02/20/2017   Multiple joint pain 02/18/2017   Right elbow pain 12/09/2016   Disturbed cognition 06/25/2016   Trochanteric bursitis of left  hip 02/18/2016   Sciatica, right side 10/30/2015   Bilateral arm pain 10/10/2015   Trochanteric bursitis of both hips 08/04/2014   Chronic fatigue 08/04/2014   Urinary frequency 08/04/2014   Abnormality of gait 08/04/2014   Unspecified visual disturbance 01/31/2013   Transient vision disturbance 01/31/2013   Routine general medical examination at a health care facility 11/04/2011   Colon polyps 11/03/2011   Hot flashes, menopausal 10/13/2011   POSTHERPETIC NEURALGIA 10/03/2009   DISPLCMT LUMBAR INTERVERT DISC W/O MYELOPATHY 09/05/2009   RENAL CALCULUS, RECURRENT 09/04/2009   Hyperlipidemia 08/31/2009   MULTIPLE SCLEROSIS, PROGRESSIVE/RELAPSING 08/31/2009   ALLERGIC RHINITIS 08/31/2009    Carney Living, PT 06/09/2021, 7:32 AM  [facility] [place], [place], [address] Phone: [phone]   Fax:  [phone]  Name: ELEXIA FRIEDT MRN: [id number] Date of Birth: [birth year]

## 2021-06-10 ENCOUNTER — Ambulatory Visit (HOSPITAL_BASED_OUTPATIENT_CLINIC_OR_DEPARTMENT_OTHER): Payer: Medicare Other | Admitting: Physical Therapy

## 2021-06-10 ENCOUNTER — Encounter (HOSPITAL_BASED_OUTPATIENT_CLINIC_OR_DEPARTMENT_OTHER): Payer: Self-pay | Admitting: Physical Therapy

## 2021-06-10 ENCOUNTER — Other Ambulatory Visit: Payer: Self-pay

## 2021-06-10 DIAGNOSIS — G8929 Other chronic pain: Secondary | ICD-10-CM

## 2021-06-10 DIAGNOSIS — R262 Difficulty in walking, not elsewhere classified: Secondary | ICD-10-CM | POA: Diagnosis not present

## 2021-06-10 DIAGNOSIS — R252 Cramp and spasm: Secondary | ICD-10-CM

## 2021-06-10 DIAGNOSIS — M6281 Muscle weakness (generalized): Secondary | ICD-10-CM

## 2021-06-10 DIAGNOSIS — M542 Cervicalgia: Secondary | ICD-10-CM | POA: Diagnosis not present

## 2021-06-10 DIAGNOSIS — M545 Low back pain, unspecified: Secondary | ICD-10-CM | POA: Diagnosis not present

## 2021-06-10 NOTE — Therapy (Signed)
[facility] [address] [place], [place], [address] Phone: [phone]   Fax:  [phone]  Physical Therapy Treatment  Patient Details  Name: Dawn [name] MRN: [id number] Date of Birth: May 19, 1946 Referring Provider (PT): Dr Arlice Colt   Encounter Date: 06/10/2021   PT End of Session - 06/10/21 1131     Visit Number 13    Number of Visits 18    Date for PT Re-Evaluation 07/23/21    Authorization Type progress note on visit 20th    PT Start Time 1100    PT Stop Time 1144    PT Time Calculation (min) 44 min    Activity Tolerance Patient tolerated treatment well    Behavior During Therapy [facility] for tasks assessed/performed             Past Medical History:  Diagnosis Date   Allergy    Dry eyes    Endometrial polyp    Essential hypertension 08/25/2018   GERD (gastroesophageal reflux disease)    History of hiatal hernia    History of kidney stones    Hot flashes, menopausal 10/13/2011   Estradiol started    Jaundice as teenager   no problems since   Memory loss 09/21/2019     1/2 of feet numb all the time   Multiple sclerosis ([place]) dx 2001   Neuropathy    Neuropathy    bilateral feet   Osteoarthritis    Sjogren's syndrome ([place]) dx oct 2021   sore muscvles, dry mouth and eyes   Stroke [facility])    Vertigo    Vision abnormalities     Past Surgical History:  Procedure Laterality Date   ABDOMINAL HYSTERECTOMY  2002   partial   colonscopy  2011   DILATATION & CURETTAGE/HYSTEROSCOPY WITH MYOSURE N/A 06/08/2020   Procedure: DILATATION & CURETTAGE/HYSTEROSCOPY/Polypectomy WITH MYOSURE;  Surgeon: Armandina Stammer, DO;  Location: [facility];  Service: Gynecology;  Laterality: N/A;   LUMBAR FUSION  2001   TOTAL HIP ARTHROPLASTY Right 01/17/2021   Procedure: TOTAL HIP ARTHROPLASTY ANTERIOR APPROACH;  Surgeon: Rod Can, MD;  [facility];  Service: Orthopedics;  Laterality: Right;    UPPER GI ENDOSCOPY  yrs ago    There were no vitals filed for this visit.   Subjective Assessment - 06/10/21 1103     Subjective Patient reports her back is feeling much better. Her hips are a little sore today. She has been doign her exercises.    Pertinent History MS    How long can you stand comfortably? hurts as soon as she stands. Has to shift frequently    How long can you walk comfortably? limited community ambualtion    Diagnostic tests Nothing recent    Patient Stated Goals to go home    Currently in Pain? Yes    Pain Score 4     Pain Location Hip    Pain Orientation Right;Left    Pain Descriptors / Indicators Aching    Pain Type Chronic pain    Pain Onset More than a month ago    Pain Frequency Constant    Aggravating Factors  standing and walking    Pain Relieving Factors light    Effect of Pain on Daily Activities difficulty walking    Multiple Pain Sites No  [facility] Adult PT Treatment/Exercise - 06/10/21 0001       Neck Exercises: Seated   Other Seated Exercise seated hip abduction 2x10 red      Lumbar Exercises: Stretches   Lower Trunk Rotation Limitations x20    Piriformis Stretch 2 reps;20 seconds      Lumbar Exercises: Standing   Other Standing Lumbar Exercises step ups and lateral step ups x10 each leg 4 inch step      Lumbar Exercises: Seated   LAQ on Chair Weights (lbs) x20 red band      Lumbar Exercises: Supine   Other Supine Lumbar Exercises supine march 3x10; supine clams shell 2x20 red      Manual Therapy   Manual therapy comments skilled palaption of trigger points    Soft tissue mobilization trigger point release to the upper gluteal and lumbar paraspinal. roller to bilateral gluteals in side lying bilateral    Manual Traction LAD Grade II-III left hip pain                     PT Education - 06/10/21 1108     Education Details reviewed ther-ex    Person(s) Educated Patient     Methods Explanation;Demonstration;Tactile cues;Verbal cues    Comprehension Verbalized understanding;Returned demonstration;Verbal cues required;Tactile cues required              PT Short Term Goals - 03/14/21 1352       PT SHORT TERM GOAL #1   Title Patient will report a 50% reduction in lower back pain    Time 4    Period Weeks    Status New    Target Date 01/17/21      PT SHORT TERM GOAL #2   Title Patient will increase distance on 6 min walk test by 200'    Time 4    Period Weeks    Status New    Target Date 04/11/21      PT SHORT TERM GOAL #3   Title Patient increase right hip flexion strength to 4+/5    Time 4    Period Weeks    Status New    Target Date 04/11/21               PT Long Term Goals - 03/14/21 1356       PT LONG TERM GOAL #1   Title Patient will return [facility] with a full program for her hip    Time 12    Period Weeks    Status New    Target Date 06/06/21      PT LONG TERM GOAL #2   Title Patient will ambualte 1500' on the 6 min walk test in order to improve community ambualtion    Time 12    Period Weeks    Status New    Target Date 06/06/21      PT LONG TERM GOAL #3   Title Patient will stand for 30 min without a significant increase in pain in order to perfrom ADL's    Time 12    Period Weeks    Status New    Target Date 06/06/21                   Plan - 06/10/21 1132     Clinical Impression Statement Therapy perfromed manual therapy to her hips She reported it felt better after rlling. She tolerated ther-ex well. Therapy added step ups today. She  was fatigued, but had no significant pain with step -ups. Therapy will cotninue to progress as tolerated.    Personal Factors and Comorbidities Comorbidity 1;Comorbidity 2    Comorbidities MS, OA    Examination-Activity Limitations Bed Mobility    Examination-Participation Restrictions Meal Prep;Cleaning;Community Activity;Laundry;Shop    Stability/Clinical  Decision Making Stable/Uncomplicated    Clinical Decision Making Low    Rehab Potential Good    PT Frequency 1x / week    PT Duration 8 weeks    PT Treatment/Interventions ADLs/Self Care Home Management;Electrical Stimulation;Cryotherapy;Iontophoresis 4mg /ml Dexamethasone;Moist Heat;Traction;DME Instruction;Neuromuscular re-education;Patient/family education;Manual techniques;Passive range of motion;Taping;Therapeutic activities;Therapeutic exercise;Balance training;Gait training;Stair training;Functional mobility training;Dry needling;Ultrasound    PT Next Visit Plan continue with pool and land strengthening, manual therapy to back    PT Home Exercise Plan reviewed seated hamstring stretch and LTR. She has done those in the past.    Consulted and Agree with Plan of Care Patient             Patient will benefit from skilled therapeutic intervention in order to improve the following deficits and impairments:  Abnormal gait, Difficulty walking, Decreased range of motion, Decreased mobility, Decreased strength, Postural dysfunction, Pain, Increased muscle spasms  Visit Diagnosis: Chronic bilateral low back pain without sciatica  Difficulty in walking, not elsewhere classified  Muscle weakness (generalized)  Cramp and spasm  Cervicalgia     Problem List Patient Active Problem List   Diagnosis Date Noted   Statin intolerance 04/19/2021   Osteoarthritis of right hip 01/17/2021   Status post THR (total hip replacement) 01/17/2021   Chronic bilateral low back pain without sciatica 10/02/2020   Chronic pain syndrome 06/20/2020   Post laminectomy syndrome 06/20/2020   Sjogren's disease ([place]) 05/23/2020   Primary osteoarthritis of both hands 05/23/2020   Primary osteoarthritis of both feet 05/23/2020   Sacroiliitis, not elsewhere classified ([place]) 12/22/2019   Other secondary scoliosis, lumbar region 12/22/2019   Spondylosis without myelopathy or radiculopathy, lumbar region  12/22/2019   Cervical radiculopathy 10/18/2019   Numbness 09/21/2019   Bilateral carpal tunnel syndrome 09/21/2019   High risk medication use 09/21/2019   Memory loss 09/21/2019   Essential hypertension 08/25/2018   Abnormal SPEP 02/19/2018   Hand pain 02/20/2017   Multiple joint pain 02/18/2017   Right elbow pain 12/09/2016   Disturbed cognition 06/25/2016   Trochanteric bursitis of left hip 02/18/2016   Sciatica, right side 10/30/2015   Bilateral arm pain 10/10/2015   Trochanteric bursitis of both hips 08/04/2014   Chronic fatigue 08/04/2014   Urinary frequency 08/04/2014   Abnormality of gait 08/04/2014   Unspecified visual disturbance 01/31/2013   Transient vision disturbance 01/31/2013   Routine general medical examination at a health care facility 11/04/2011   Colon polyps 11/03/2011   Hot flashes, menopausal 10/13/2011   POSTHERPETIC NEURALGIA 10/03/2009   DISPLCMT LUMBAR INTERVERT DISC W/O MYELOPATHY 09/05/2009   RENAL CALCULUS, RECURRENT 09/04/2009   Hyperlipidemia 08/31/2009   MULTIPLE SCLEROSIS, PROGRESSIVE/RELAPSING 08/31/2009   ALLERGIC RHINITIS 08/31/2009    Carney Living, PT 06/10/2021, 12:41 PM  [facility] [address] [place], [place], [address] Phone: [phone]   Fax:  [phone]  Name: METTE SOUTHGATE MRN: [id number] Date of Birth: 11-05-1945

## 2021-06-12 ENCOUNTER — Telehealth: Payer: Self-pay | Admitting: Neurology

## 2021-06-12 NOTE — Telephone Encounter (Signed)
Called pt to let her know PA approved. She can now fill at pharmacy. She verbalized understanding and appreciation.   "Request Reference Number: [id number]. MODAFINIL TAB 200MG  is approved through 01/18/2022. Your patient may now fill this prescription and it will be covered."

## 2021-06-12 NOTE — Telephone Encounter (Signed)
Pt called states she received a letter from her ins about a PA for modafinil (PROVIGIL) 200 MG tablet. She did leave a number to call [phone].

## 2021-06-12 NOTE — Telephone Encounter (Signed)
Submitted PA on CMM. Key: [id number]. Waiting on determination from [employer].

## 2021-06-14 ENCOUNTER — Encounter (HOSPITAL_COMMUNITY): Payer: Self-pay | Admitting: *Deleted

## 2021-06-14 ENCOUNTER — Ambulatory Visit (HOSPITAL_COMMUNITY)
Admission: EM | Admit: 2021-06-14 | Discharge: 2021-06-14 | Payer: Medicare Other | Attending: Internal Medicine | Admitting: Internal Medicine

## 2021-06-14 ENCOUNTER — Other Ambulatory Visit: Payer: Self-pay

## 2021-06-14 ENCOUNTER — Observation Stay (HOSPITAL_COMMUNITY)
Admission: EM | Admit: 2021-06-14 | Discharge: 2021-06-16 | Disposition: A | Discharge status: Home / Self Care | Payer: Medicare Other | Attending: Internal Medicine | Admitting: Internal Medicine

## 2021-06-14 ENCOUNTER — Encounter (HOSPITAL_COMMUNITY): Payer: Self-pay

## 2021-06-14 ENCOUNTER — Emergency Department (HOSPITAL_COMMUNITY): Payer: Medicare Other

## 2021-06-14 DIAGNOSIS — I110 Hypertensive heart disease with heart failure: Secondary | ICD-10-CM | POA: Insufficient documentation

## 2021-06-14 DIAGNOSIS — I5031 Acute diastolic (congestive) heart failure: Secondary | ICD-10-CM | POA: Diagnosis not present

## 2021-06-14 DIAGNOSIS — R079 Chest pain, unspecified: Secondary | ICD-10-CM

## 2021-06-14 DIAGNOSIS — I214 Non-ST elevation (NSTEMI) myocardial infarction: Secondary | ICD-10-CM

## 2021-06-14 DIAGNOSIS — I10 Essential (primary) hypertension: Secondary | ICD-10-CM | POA: Diagnosis present

## 2021-06-14 DIAGNOSIS — Z79899 Other long term (current) drug therapy: Secondary | ICD-10-CM | POA: Insufficient documentation

## 2021-06-14 DIAGNOSIS — Z7982 Long term (current) use of aspirin: Secondary | ICD-10-CM | POA: Insufficient documentation

## 2021-06-14 DIAGNOSIS — R778 Other specified abnormalities of plasma proteins: Secondary | ICD-10-CM | POA: Diagnosis present

## 2021-06-14 DIAGNOSIS — I248 Other forms of acute ischemic heart disease: Secondary | ICD-10-CM | POA: Diagnosis present

## 2021-06-14 DIAGNOSIS — Z87891 Personal history of nicotine dependence: Secondary | ICD-10-CM | POA: Diagnosis not present

## 2021-06-14 DIAGNOSIS — I2489 Other forms of acute ischemic heart disease: Secondary | ICD-10-CM | POA: Diagnosis present

## 2021-06-14 DIAGNOSIS — G35 Multiple sclerosis: Secondary | ICD-10-CM | POA: Insufficient documentation

## 2021-06-14 DIAGNOSIS — R748 Abnormal levels of other serum enzymes: Secondary | ICD-10-CM | POA: Diagnosis not present

## 2021-06-14 DIAGNOSIS — R7989 Other specified abnormal findings of blood chemistry: Secondary | ICD-10-CM | POA: Diagnosis present

## 2021-06-14 DIAGNOSIS — G35D Multiple sclerosis, unspecified: Secondary | ICD-10-CM | POA: Diagnosis present

## 2021-06-14 LAB — BASIC METABOLIC PANEL
Anion gap: 8 (ref 5–15)
BUN: 16 mg/dL (ref 8–23)
CO2: 21 mmol/L — ABNORMAL LOW (ref 22–32)
Calcium: 9.4 mg/dL (ref 8.9–10.3)
Chloride: 108 mmol/L (ref 98–111)
Creatinine, Ser: 0.62 mg/dL (ref 0.44–1.00)
GFR, Estimated: >60 mL/min (ref >60)
Glucose, Bld: 87 mg/dL (ref 70–99)
Potassium: 3.8 mmol/L (ref 3.5–5.1)
Sodium: 137 mmol/L (ref 135–145)

## 2021-06-14 LAB — CBC WITH DIFFERENTIAL/PLATELET
Abs Immature Granulocytes: 0.02 10*3/uL (ref 0.00–0.07)
Basophils Absolute: 0.1 10*3/uL (ref 0.0–0.1)
Basophils Relative: 1 %
Eosinophils Absolute: 0.1 10*3/uL (ref 0.0–0.5)
Eosinophils Relative: 2 %
HCT: 39.7 % (ref 36.0–46.0)
Hemoglobin: 13.2 g/dL (ref 12.0–15.0)
Immature Granulocytes: 0 %
Lymphocytes Relative: 14 %
Lymphs Abs: 1 10*3/uL (ref 0.7–4.0)
MCH: 28.5 pg (ref 26.0–34.0)
MCHC: 33.2 g/dL (ref 30.0–36.0)
MCV: 85.7 fL (ref 80.0–100.0)
Monocytes Absolute: 0.5 10*3/uL (ref 0.1–1.0)
Monocytes Relative: 7 %
Neutro Abs: 5.7 10*3/uL (ref 1.7–7.7)
Neutrophils Relative %: 76 %
Platelets: 223 10*3/uL (ref 150–400)
RBC: 4.63 MIL/uL (ref 3.87–5.11)
RDW: 14.9 % (ref 11.5–15.5)
WBC: 7.5 10*3/uL (ref 4.0–10.5)
nRBC: 0 % (ref 0.0–0.2)

## 2021-06-14 LAB — TROPONIN I (HIGH SENSITIVITY)
Troponin I (High Sensitivity): 182 ng/L (ref <18)
Troponin I (High Sensitivity): 316 ng/L (ref <18)
Troponin I (High Sensitivity): 406 ng/L (ref <18)

## 2021-06-14 MED ORDER — LEFLUNOMIDE 20 MG PO TABS
20.0000 mg | ORAL_TABLET | Freq: Every day | ORAL | Status: DC
  Administered 2021-06-15 – 2021-06-16 (×2): 20 mg via ORAL
  Filled 2021-06-14 (×2): qty 1

## 2021-06-14 MED ORDER — CLONAZEPAM 0.5 MG PO TABS
0.5000 mg | ORAL_TABLET | Freq: Every day | ORAL | Status: DC
  Administered 2021-06-14 – 2021-06-15 (×2): 0.5 mg via ORAL
  Filled 2021-06-14 (×2): qty 1

## 2021-06-14 MED ORDER — PANTOPRAZOLE SODIUM 40 MG PO TBEC
40.0000 mg | DELAYED_RELEASE_TABLET | Freq: Every day | ORAL | Status: DC
  Administered 2021-06-15 – 2021-06-16 (×2): 40 mg via ORAL
  Filled 2021-06-14 (×2): qty 1

## 2021-06-14 MED ORDER — ONDANSETRON HCL 4 MG/2ML IJ SOLN: 4.0000 mg | Freq: Four times a day (QID) | INTRAMUSCULAR | Status: DC | PRN

## 2021-06-14 MED ORDER — MODAFINIL 100 MG PO TABS
200.0000 mg | ORAL_TABLET | Freq: Every day | ORAL | Status: DC
  Filled 2021-06-14 (×2): qty 2

## 2021-06-14 MED ORDER — LOSARTAN POTASSIUM 50 MG PO TABS
100.0000 mg | ORAL_TABLET | Freq: Every day | ORAL | Status: DC
  Administered 2021-06-15 – 2021-06-16 (×2): 100 mg via ORAL
  Filled 2021-06-14 (×2): qty 2

## 2021-06-14 MED ORDER — ENOXAPARIN SODIUM 40 MG/0.4ML IJ SOSY
40.0000 mg | PREFILLED_SYRINGE | INTRAMUSCULAR | Status: DC
  Administered 2021-06-14 – 2021-06-15 (×2): 40 mg via SUBCUTANEOUS
  Filled 2021-06-14 (×2): qty 0.4

## 2021-06-14 MED ORDER — LIDOCAINE 5 % EX PTCH: 1.0000 | MEDICATED_PATCH | Freq: Every day | CUTANEOUS | Status: DC | PRN

## 2021-06-14 MED ORDER — ASPIRIN EC 81 MG PO TBEC
81.0000 mg | DELAYED_RELEASE_TABLET | Freq: Every day | ORAL | Status: DC
  Administered 2021-06-15 – 2021-06-16 (×2): 81 mg via ORAL
  Filled 2021-06-14 (×2): qty 1

## 2021-06-14 MED ORDER — NITROGLYCERIN 0.4 MG SL SUBL: 0.4000 mg | SUBLINGUAL_TABLET | SUBLINGUAL | Status: DC | PRN

## 2021-06-14 MED ORDER — ACETAMINOPHEN 325 MG PO TABS: 650.0000 mg | ORAL_TABLET | ORAL | Status: DC | PRN

## 2021-06-14 MED ORDER — DARIFENACIN HYDROBROMIDE ER 7.5 MG PO TB24
7.5000 mg | ORAL_TABLET | Freq: Every day | ORAL | Status: DC
  Administered 2021-06-15 – 2021-06-16 (×2): 7.5 mg via ORAL
  Filled 2021-06-14 (×2): qty 1

## 2021-06-14 NOTE — ED Notes (Signed)
Called floor to give report , charge nurse hasnt looked at patient and assigned nurse yet, they will activate purple man

## 2021-06-14 NOTE — ED Provider Notes (Addendum)
Emergency Medicine Provider Triage Evaluation Note  Dawn [name] , a 75 y.o. female  was evaluated in triage.  Pt complains of chest pain for the last 2 hours with associated lightheadedness, shortness of breath, and chest tightness.  Pain radiates down both arms with some tingling down the left arm.  No history of the same prior to this week though she did have 1 episode a few days ago very similar to today's.  History of MS and spinal stenosis of the lumbar spine. No recent travel or prolonged immobilization, no hormones or hx of clot.   Review of Systems  Positive: Chest pain, chest tightness, shortness of breath Negative: Palpitations, fevers, chills, syncope  Physical Exam  BP (!) 149/122 (BP Location: Left Arm)   Pulse 81   Temp 98.1 F (36.7 C) (Oral)   Resp 14   Ht 5\' 2"  (1.575 m)   Wt 81.6 kg   SpO2 97%   BMI 32.92 kg/m  Gen:   Awake, no distress   Resp:  Normal effort  MSK:   Moves extremities without difficulty  Other:  PERRL, EOMI, symmetric strength and sensation in the extremities.  RRR no M/R/G.  Lungs CTA B.  Patient does appear short of breath with speech.  Medical Decision Making  Medically screening exam initiated at 5:55 PM.  Appropriate orders placed.  Dawn [name] was informed that the remainder of the evaluation will be completed by another provider, this initial triage assessment does not replace that evaluation, and the importance of remaining in the ED until their evaluation is complete.  This chart was dictated using voice recognition software, Dragon. Despite the best efforts of this provider to proofread and correct errors, errors may still occur which can change documentation meaning.    Dawn Darling, PA-C 06/14/21 1756    Dawn [name], Dawn Balsam, PA-C 06/14/21 1756    Dawn Dessert, MD 06/14/21 2027

## 2021-06-14 NOTE — ED Notes (Signed)
Patient is being discharged from the Urgent Care and sent to the Emergency Department via personal vehicle with family . Per Dr Lanny Cramp, patient is in need of higher level of care due to chest pain & SOB. Patient is aware and verbalizes understanding of plan of care.  Vitals:   06/14/21 1704  BP: (!) 160/81  Pulse: 75  Resp: 20  Temp: 98.2 F (36.8 C)  SpO2: 100%

## 2021-06-14 NOTE — H&P (Signed)
History and Physical    Dawn [name]:[id number] DOB: 1946-03-14 DOA: 06/14/2021  PCP: Sandrea Hughs, NP  Patient coming from: Home  I have personally briefly reviewed patient's old medical records in [place]  Chief Complaint: Chest pain  HPI: Dawn [name] is a 75 y.o. female with medical history significant for multiple sclerosis, hypertension, history of CVA, hyperlipidemia, Sjogren's syndrome, chronic back pain who presented to the ED for evaluation of chest pain.  Patient reports experiencing an episode of chest pain 2 days prior to admission.  She says it was across her sternum and lower rib cage area.  Pain felt like a squeezing sensation.  This was accompanied by a painful sensation from both of her elbows down to her hands.  Initial episode happened while she was doing chores around the house.  She had nausea but no emesis.  She felt chills but no diaphoresis.  Symptoms resolved after about 1 hour.  Patient had similar symptoms earlier today (11/25) again while she was active.  She tried to rest to relieve her symptoms however they were persistent for about 2 hours.  She had new shortness of breath today which is why she came to the hospital for further evaluation.  She is currently chest pain-free.  She says she does have occasional chest discomfort due to " MS hug" however these 2 recent episodes felt different to anything she experienced before.  She has also had a burning sensation in her throat which is new.  She underwent right total hip replacement 01/17/2021.  She was given SCDs and aspirin for DVT prophylaxis.  She has not had any swelling in her lower extremities.  She reports a family history of heart disease in her father who died at age 30 of a heart attack.  She is a former smoker, quit in 1987.  She denies any alcohol or illicit drug use.  She has not had any previous cardiac work-up.  ED Course:  Initial vitals showed BP 149/122, pulse 81, RR  14, temp 98.1 F, SPO2 97% on room air.  Labs show sodium 137, potassium 3.8, bicarb 21, BUN 16, creatinine 0.62, serum glucose 87, WBC 7.5, hemoglobin 13.2, platelets 223,000, high-sensitivity troponin 182.  2 view chest x-ray negative for focal consolidation, edema, or effusion.  The hospitalist service was consulted to admit for further evaluation and management.  Review of Systems: All systems reviewed and are negative except as documented in history of present illness above.   Past Medical History:  Diagnosis Date   Allergy    Dry eyes    Endometrial polyp    Essential hypertension 08/25/2018   GERD (gastroesophageal reflux disease)    History of hiatal hernia    History of kidney stones    Hot flashes, menopausal 10/13/2011   Estradiol started    Jaundice as teenager   no problems since   Memory loss 09/21/2019     1/2 of feet numb all the time   Multiple sclerosis ([place]) dx 2001   Neuropathy    Neuropathy    bilateral feet   Osteoarthritis    Sjogren's syndrome ([place]) dx oct 2021   sore muscvles, dry mouth and eyes   Stroke [facility] [place])    Vertigo    Vision abnormalities     Past Surgical History:  Procedure Laterality Date   ABDOMINAL HYSTERECTOMY  2002   partial   colonscopy  2011   DILATATION & CURETTAGE/HYSTEROSCOPY WITH MYOSURE N/A 06/08/2020  Procedure: DILATATION & CURETTAGE/HYSTEROSCOPY/Polypectomy WITH MYOSURE;  Surgeon: Armandina Stammer, DO;  Location: [facility];  Service: Gynecology;  Laterality: N/A;   LUMBAR FUSION  2001   TOTAL HIP ARTHROPLASTY Right 01/17/2021   Procedure: TOTAL HIP ARTHROPLASTY ANTERIOR APPROACH;  Surgeon: Rod Can, MD;  Location: [facility];  Service: Orthopedics;  Laterality: Right;   UPPER GI ENDOSCOPY  yrs ago    Social History:  reports that she quit smoking about 37 years ago. Her smoking use included cigarettes. She has a 7.50 pack-year smoking history. She has never used smokeless tobacco. She reports  current alcohol use. She reports that she does not use drugs.  Allergies  Allergen Reactions   Statins Other (See Comments)    Muscle pain    Family History  Problem Relation Age of Onset   Heart failure Mother    Heart failure Father    Arthritis Other    Hypertension Other      Prior to Admission medications   Medication Sig Start Date End Date Taking? Authorizing Provider  aspirin EC 81 MG tablet Take 81 mg by mouth daily. Swallow whole.   Yes [provider]  CALCIUM-VITAMIN D PO Take 1 tablet by mouth in the morning.   Yes [provider]  clonazePAM (KLONOPIN) 0.5 MG tablet TAKE 1 TABLET(0.5 MG) BY MOUTH AT BEDTIME Patient taking differently: Take 0.5 mg by mouth at bedtime. 04/08/21  Yes [name], [name], MD  fexofenadine (ALLEGRA) 180 MG tablet Take 180 mg by mouth at bedtime.    Yes [provider]  Flaxseed, Linseed, (FLAXSEED OIL PO) Take 5-10 mLs by mouth 3 (three) times a week.   Yes [provider]  leflunomide (ARAVA) 20 MG tablet Take 1 tablet (20 mg total) by mouth daily. 10/02/20  Yes [name], [name], MD  lidocaine (LIDODERM) 5 % Place 1 patch onto the skin daily as needed (mild pain). Remove & Discard patch within 12 hours or as directed by MD   Yes [provider]  losartan (COZAAR) 100 MG tablet Take 1 tablet (100 mg total) by mouth daily. 11/21/20  Yes [place], Amy E, NP  modafinil (PROVIGIL) 200 MG tablet Take 1 tablet (200 mg total) by mouth daily. 04/23/21  Yes [name], [name], MD  Omega-3 300 MG CAPS Take 300 mg by mouth in the morning.   Yes [provider]  omeprazole (PRILOSEC) 40 MG capsule TAKE 1 CAPSULE(40 MG) BY MOUTH DAILY Patient taking differently: Take 40 mg by mouth daily. 03/18/21  Yes [name], [name], MD  solifenacin (VESICARE) 5 MG tablet Take 1 tablet (5 mg total) by mouth daily. 10/02/20  Yes [name], [name], MD  amoxicillin-clavulanate (AUGMENTIN) 875-125 MG tablet Take 1 tablet by mouth 2  (two) times daily. Patient not taking: Reported on 06/14/2021 05/14/21   [name], Dinah C, NP  ezetimibe (ZETIA) 10 MG tablet Take 1 tablet (10 mg total) by mouth daily. Patient not taking: Reported on 06/14/2021 11/21/20   Yvonna Alanis, NP  meclizine (ANTIVERT) 25 MG tablet Take 1 tablet (25 mg total) by mouth 3 (three) times daily as needed for dizziness. Patient not taking: Reported on 06/14/2021 05/14/21   Sandrea Hughs, NP    Physical Exam: Vitals:   06/14/21 1845 06/14/21 1900 06/14/21 1930 06/14/21 2000  BP: (!) 161/73 (!) 163/73 (!) 170/83 (!) 173/81  Pulse: 66 67 70 68  Resp: 17 20 13 13   Temp:  TempSrc:      SpO2: 99% 97% 95% 97%  Weight:      Height:       Constitutional: Resting in bed, NAD, calm, comfortable Eyes: PERRL, lids and conjunctivae normal ENMT: Mucous membranes are moist. Posterior pharynx clear of any exudate or lesions.Normal dentition.  Neck: normal, supple, no masses. Respiratory: clear to auscultation bilaterally, no wheezing, no crackles. Normal respiratory effort. No accessory muscle use.  Cardiovascular: Regular rate and rhythm, no murmurs / rubs / gallops. No extremity edema. 2+ pedal pulses. Abdomen: no tenderness, no masses palpated. No hepatosplenomegaly. Bowel sounds positive.  Musculoskeletal: no clubbing / cyanosis. No joint deformity upper and lower extremities. Good ROM, no contractures. Normal muscle tone.  Skin: no rashes, lesions, ulcers. No induration Neurologic: CN 2-12 grossly intact. Sensation intact. Strength slightly weak RLE s/p right hip replacement but largely 5/5 in all 4.  Psychiatric: Normal judgment and insight. Alert and oriented x 3. Normal mood.   Labs on Admission: I have personally reviewed following labs and imaging studies  CBC: Recent Labs  Lab 06/14/21 1750  WBC 7.5  NEUTROABS 5.7  HGB 13.2  HCT 39.7  MCV 85.7  PLT 892   Basic Metabolic Panel: Recent Labs  Lab 06/14/21 1750  NA 137  K 3.8  CL 108   CO2 21*  GLUCOSE 87  BUN 16  CREATININE 0.62  CALCIUM 9.4   GFR: Estimated Creatinine Clearance: 62 mL/min (by C-G formula based on SCr of 0.62 mg/dL). Liver Function Tests: No results for input(s): AST, ALT, ALKPHOS, BILITOT, PROT, ALBUMIN in the last 168 hours. No results for input(s): LIPASE, AMYLASE in the last 168 hours. No results for input(s): AMMONIA in the last 168 hours. Coagulation Profile: No results for input(s): INR, PROTIME in the last 168 hours. Cardiac Enzymes: No results for input(s): CKTOTAL, CKMB, CKMBINDEX, TROPONINI in the last 168 hours. BNP (last 3 results) No results for input(s): PROBNP in the last 8760 hours. HbA1C: No results for input(s): HGBA1C in the last 72 hours. CBG: No results for input(s): GLUCAP in the last 168 hours. Lipid Profile: No results for input(s): CHOL, HDL, LDLCALC, TRIG, CHOLHDL, LDLDIRECT in the last 72 hours. Thyroid Function Tests: No results for input(s): TSH, T4TOTAL, FREET4, T3FREE, THYROIDAB in the last 72 hours. Anemia Panel: No results for input(s): VITAMINB12, FOLATE, FERRITIN, TIBC, IRON, RETICCTPCT in the last 72 hours. Urine analysis:    Component Value Date/Time   COLORURINE AMBER (A) 01/07/2021 1122   APPEARANCEUR CLEAR 01/07/2021 1122   APPEARANCEUR Clear 08/25/2018 1215   LABSPEC 1.010 01/07/2021 1122   PHURINE 5.0 01/07/2021 1122   GLUCOSEU NEGATIVE 01/07/2021 1122   GLUCOSEU NEGATIVE 12/05/2009 0804   HGBUR NEGATIVE 01/07/2021 1122   HGBUR trace-lysed 08/31/2009 1319   BILIRUBINUR NEGATIVE 01/07/2021 1122   BILIRUBINUR Negative 08/25/2018 [place] 01/07/2021 1122   PROTEINUR NEGATIVE 01/07/2021 1122   UROBILINOGEN 0.2 10/13/2011 1531   NITRITE POSITIVE (A) 01/07/2021 1122   LEUKOCYTESUR NEGATIVE 01/07/2021 1122    Radiological Exams on Admission: DG Chest 2 View  Result Date: 06/14/2021 CLINICAL DATA:  Chest pain EXAM: CHEST - 2 VIEW COMPARISON:  None. FINDINGS: The heart size  and mediastinal contours are within normal limits. Both lungs are clear. Aortic atherosclerosis. The visualized skeletal structures are unremarkable. IMPRESSION: No active cardiopulmonary disease. Electronically Signed   By: Donavan Foil M.D.   On: 06/14/2021 19:10    EKG: Personally reviewed. Initial EKG showed nonspecific T  wave changes in lead III with otherwise normal sinus rhythm.  Repeat EKG showed less pronounced T wave changes.  Previous EKG 06/09/2020 showed T wave flattening in lead III.  Assessment/Plan Principal Problem:   Chest pain Active Problems:   MULTIPLE SCLEROSIS, PROGRESSIVE/RELAPSING   Essential hypertension   Elevated troponin   Dawn [name] is a 75 y.o. female with medical history significant for multiple sclerosis, hypertension, history of CVA, hyperlipidemia, Sjogren's syndrome, chronic back pain who is admitted for evaluation of chest pain.  Chest pain with elevated troponin: Atypical chest pain however has elevated troponin and nonspecific T wave changes in lead III.  Reports some difference in symptoms to her prior MS hugs. -Keep on telemetry, EKG as needed -Obtain echocardiogram -Continue aspirin 81 mg daily -Nitroglycerin as needed -Trend troponin  Hypertension: Continue losartan.  Multiple sclerosis: Follows with neurology, Dr. Felecia Shelling.  Does not appear to have acute flareup. -Continue leflunomide, modafinil, clonazepam  History of CVA: Continue aspirin 81 mg daily.  Hyperlipidemia: Has reported intolerance to statins.  Previously on Zetia however this was held a couple months ago due to concern for potential side effect of muscle aches which resolved after stopping.  Chronic back pain: Continue Lidoderm patch as needed.  DVT prophylaxis: Lovenox Code Status: Full code Family Communication: Discussed with patient's godson at bedside Disposition Plan: From home and likely discharge to home pending clinical progress Consults called:  None Level of care: Telemetry Cardiac Admission status:  Status is: Observation  The patient remains OBS appropriate and will d/c before 2 midnights.   Zada Finders MD [facility]  If 7PM-7AM, please contact night-coverage [email or web address]  06/14/2021, 8:59 PM

## 2021-06-14 NOTE — ED Triage Notes (Signed)
The pt is c/o chest pain for 2 hours also c/o dizziness hyperventilating at present no previous history  both arms painful

## 2021-06-14 NOTE — ED Provider Notes (Signed)
[facility] EMERGENCY DEPARTMENT Provider Note   CSN: [id number] Arrival date & time: 06/14/21  1728     History Chief Complaint  Patient presents with   Chest Pain    Dawn [name] is a 75 y.o. female.  The history is provided by the patient and medical records.  Chest Pain Pain location:  Substernal area Pain quality: pressure   Pain radiates to:  Does not radiate Pain severity:  Moderate Onset quality:  Gradual Duration:  2 hours Timing:  Intermittent Progression:  Resolved Chronicity:  New Context: at rest   Relieved by:  Nothing Worsened by:  Nothing Associated symptoms: shortness of breath   Associated symptoms: no abdominal pain, no back pain, no cough, no fever, no palpitations and no vomiting    HPI: A 75 year old patient with a history of hypertension and obesity presents for evaluation of chest pain. Initial onset of pain was approximately 1-3 hours ago. The patient's chest pain is described as heaviness/pressure/tightness and is not worse with exertion. The patient's chest pain is middle- or left-sided, is not well-localized, is not sharp and does radiate to the arms/jaw/neck. The patient does not complain of nausea and denies diaphoresis. The patient has no history of stroke, has no history of peripheral artery disease, has not smoked in the past 90 days, denies any history of treated diabetes, has no relevant family history of coronary artery disease (first degree relative at less than age 53) and has no history of hypercholesterolemia.   Past Medical History:  Diagnosis Date   Allergy    Dry eyes    Endometrial polyp    Essential hypertension 08/25/2018   GERD (gastroesophageal reflux disease)    History of hiatal hernia    History of kidney stones    Hot flashes, menopausal 10/13/2011   Estradiol started    Jaundice as teenager   no problems since   Memory loss 09/21/2019     1/2 of feet numb all the time   Multiple sclerosis  ([place]) dx 2001   Neuropathy    Neuropathy    bilateral feet   Osteoarthritis    Sjogren's syndrome ([place]) dx oct 2021   sore muscvles, dry mouth and eyes   Stroke [facility])    Vertigo    Vision abnormalities     Patient Active Problem List   Diagnosis Date Noted   Chest pain 06/14/2021   Elevated troponin 06/14/2021   Statin intolerance 04/19/2021   Osteoarthritis of right hip 01/17/2021   Status post THR (total hip replacement) 01/17/2021   Chronic bilateral low back pain without sciatica 10/02/2020   Chronic pain syndrome 06/20/2020   Post laminectomy syndrome 06/20/2020   Sjogren's disease ([place]) 05/23/2020   Primary osteoarthritis of both hands 05/23/2020   Primary osteoarthritis of both feet 05/23/2020   Sacroiliitis, not elsewhere classified ([place]) 12/22/2019   Other secondary scoliosis, lumbar region 12/22/2019   Spondylosis without myelopathy or radiculopathy, lumbar region 12/22/2019   Cervical radiculopathy 10/18/2019   Numbness 09/21/2019   Bilateral carpal tunnel syndrome 09/21/2019   High risk medication use 09/21/2019   Memory loss 09/21/2019   Essential hypertension 08/25/2018   Abnormal SPEP 02/19/2018   Hand pain 02/20/2017   Multiple joint pain 02/18/2017   Right elbow pain 12/09/2016   Disturbed cognition 06/25/2016   Trochanteric bursitis of left hip 02/18/2016   Sciatica, right side 10/30/2015   Bilateral arm pain 10/10/2015   Trochanteric bursitis of both hips 08/04/2014  Chronic fatigue 08/04/2014   Urinary frequency 08/04/2014   Abnormality of gait 08/04/2014   Unspecified visual disturbance 01/31/2013   Transient vision disturbance 01/31/2013   Routine general medical examination at a health care facility 11/04/2011   Colon polyps 11/03/2011   Hot flashes, menopausal 10/13/2011   POSTHERPETIC NEURALGIA 10/03/2009   DISPLCMT LUMBAR INTERVERT DISC W/O MYELOPATHY 09/05/2009   RENAL CALCULUS, RECURRENT 09/04/2009   Hyperlipidemia 08/31/2009    MULTIPLE SCLEROSIS, PROGRESSIVE/RELAPSING 08/31/2009   ALLERGIC RHINITIS 08/31/2009    Past Surgical History:  Procedure Laterality Date   ABDOMINAL HYSTERECTOMY  2002   partial   colonscopy  2011   DILATATION & CURETTAGE/HYSTEROSCOPY WITH MYOSURE N/A 06/08/2020   Procedure: DILATATION & CURETTAGE/HYSTEROSCOPY/Polypectomy WITH MYOSURE;  Surgeon: Armandina Stammer, DO;  Location: [facility];  Service: Gynecology;  Laterality: N/A;   LUMBAR FUSION  2001   TOTAL HIP ARTHROPLASTY Right 01/17/2021   Procedure: TOTAL HIP ARTHROPLASTY ANTERIOR APPROACH;  Surgeon: Rod Can, MD;  Location: [facility];  Service: Orthopedics;  Laterality: Right;   UPPER GI ENDOSCOPY  yrs ago     OB History     Gravida  2   Para      Term      Preterm      AB  2   Living  0      SAB  2   IAB      Ectopic      Multiple      Live Births              Family History  Problem Relation Age of Onset   Heart failure Mother    Heart failure Father    Arthritis Other    Hypertension Other     Social History   Tobacco Use   Smoking status: Former    Packs/day: 0.50    Years: 15.00    Pack years: 7.50    Types: Cigarettes    Quit date: 08/05/1983    Years since quitting: 37.8   Smokeless tobacco: Never  Vaping Use   Vaping Use: Never used  Substance Use Topics   Alcohol use: Yes    Alcohol/week: 0.0 standard drinks    Comment: rarely   Drug use: No    Home Medications Prior to Admission medications   Medication Sig Start Date End Date Taking? Authorizing Provider  aspirin EC 81 MG tablet Take 81 mg by mouth daily. Swallow whole.   Yes [provider]  CALCIUM-VITAMIN D PO Take 1 tablet by mouth in the morning.   Yes [provider]  clonazePAM (KLONOPIN) 0.5 MG tablet TAKE 1 TABLET(0.5 MG) BY MOUTH AT BEDTIME Patient taking differently: Take 0.5 mg by mouth at bedtime. 04/08/21  Yes [name], [name], MD  fexofenadine (ALLEGRA) 180 MG tablet  Take 180 mg by mouth at bedtime.    Yes [provider]  Flaxseed, Linseed, (FLAXSEED OIL PO) Take 5-10 mLs by mouth 3 (three) times a week.   Yes [provider]  leflunomide (ARAVA) 20 MG tablet Take 1 tablet (20 mg total) by mouth daily. 10/02/20  Yes [name], [name], MD  lidocaine (LIDODERM) 5 % Place 1 patch onto the skin daily as needed (mild pain). Remove & Discard patch within 12 hours or as directed by MD   Yes [provider]  losartan (COZAAR) 100 MG tablet Take 1 tablet (100 mg total) by mouth daily. 11/21/20  Yes Yvonna Alanis, NP  modafinil (PROVIGIL) 200 MG tablet Take 1 tablet (200 mg total) by mouth daily. 04/23/21  Yes [name], [name], MD  Omega-3 300 MG CAPS Take 300 mg by mouth in the morning.   Yes [provider]  omeprazole (PRILOSEC) 40 MG capsule TAKE 1 CAPSULE(40 MG) BY MOUTH DAILY Patient taking differently: Take 40 mg by mouth daily. 03/18/21  Yes [name], [name], MD  solifenacin (VESICARE) 5 MG tablet Take 1 tablet (5 mg total) by mouth daily. 10/02/20  Yes [name], [name], MD  ezetimibe (ZETIA) 10 MG tablet Take 1 tablet (10 mg total) by mouth daily. Patient not taking: Reported on 06/14/2021 11/21/20   Yvonna Alanis, NP  meclizine (ANTIVERT) 25 MG tablet Take 1 tablet (25 mg total) by mouth 3 (three) times daily as needed for dizziness. Patient not taking: Reported on 06/14/2021 05/14/21   [name], Dinah C, NP    Allergies    Statins  Review of Systems   Review of Systems  Constitutional:  Negative for chills and fever.  HENT:  Negative for ear pain and sore throat.   Eyes:  Negative for pain and visual disturbance.  Respiratory:  Positive for shortness of breath. Negative for cough.   Cardiovascular:  Positive for chest pain. Negative for palpitations.  Gastrointestinal:  Negative for abdominal pain and vomiting.  Genitourinary:  Negative for dysuria and hematuria.  Musculoskeletal:  Negative for arthralgias and back pain.   Skin:  Negative for color change and rash.  Neurological:  Negative for seizures and syncope.  All other systems reviewed and are negative.  Physical Exam Updated Vital Signs BP (!) 168/86 (BP Location: Left Arm)   Pulse 66   Temp 98.1 F (36.7 C) (Oral)   Resp 17   Ht 5\' 2"  (1.575 m)   Wt 82.6 kg   SpO2 95%   BMI 33.29 kg/m   Physical Exam Vitals and nursing note reviewed.  Constitutional:      General: She is not in acute distress.    Appearance: Normal appearance. She is well-developed.  HENT:     Head: Normocephalic and atraumatic.     Right Ear: External ear normal.     Left Ear: External ear normal.     Nose: Nose normal. No congestion or rhinorrhea.     Mouth/Throat:     Mouth: Mucous membranes are moist.  Eyes:     Extraocular Movements: Extraocular movements intact.     Conjunctiva/sclera: Conjunctivae normal.     Pupils: Pupils are equal, round, and reactive to light.  Cardiovascular:     Rate and Rhythm: Normal rate and regular rhythm.     Pulses: Normal pulses.          Radial pulses are 2+ on the right side and 2+ on the left side.     Heart sounds: No murmur heard. Pulmonary:     Effort: Pulmonary effort is normal. No respiratory distress.     Breath sounds: Normal breath sounds. No wheezing, rhonchi or rales.  Abdominal:     General: Abdomen is flat. Bowel sounds are normal.     Palpations: Abdomen is soft.     Tenderness: There is no abdominal tenderness. There is no guarding or rebound.  Musculoskeletal:        General: No swelling, tenderness or deformity.     Cervical back: Normal range of motion and neck supple. No rigidity.  Skin:    General: Skin is warm and dry.  Capillary Refill: Capillary refill takes less than 2 seconds.  Neurological:     General: No focal deficit present.     Mental Status: She is alert and oriented to person, place, and time.  Psychiatric:        Mood and Affect: Mood normal.    ED Results / Procedures /  Treatments   Labs (all labs ordered are listed, but only abnormal results are displayed) Labs Reviewed  BASIC METABOLIC PANEL - Abnormal; Notable for the following components:      Result Value   CO2 21 (*)    All other components within normal limits  LIPID PANEL - Abnormal; Notable for the following components:   Cholesterol 213 (*)    LDL Cholesterol 142 (*)    All other components within normal limits  BASIC METABOLIC PANEL - Abnormal; Notable for the following components:   Potassium 3.3 (*)    All other components within normal limits  MAGNESIUM - Abnormal; Notable for the following components:   Magnesium 1.6 (*)    All other components within normal limits  TROPONIN I (HIGH SENSITIVITY) - Abnormal; Notable for the following components:   Troponin I (High Sensitivity) 182 (*)    All other components within normal limits  TROPONIN I (HIGH SENSITIVITY) - Abnormal; Notable for the following components:   Troponin I (High Sensitivity) 316 (*)    All other components within normal limits  TROPONIN I (HIGH SENSITIVITY) - Abnormal; Notable for the following components:   Troponin I (High Sensitivity) 406 (*)    All other components within normal limits  TROPONIN I (HIGH SENSITIVITY) - Abnormal; Notable for the following components:   Troponin I (High Sensitivity) 387 (*)    All other components within normal limits  TROPONIN I (HIGH SENSITIVITY) - Abnormal; Notable for the following components:   Troponin I (High Sensitivity) 365 (*)    All other components within normal limits  TROPONIN I (HIGH SENSITIVITY) - Abnormal; Notable for the following components:   Troponin I (High Sensitivity) 281 (*)    All other components within normal limits  TROPONIN I (HIGH SENSITIVITY) - Abnormal; Notable for the following components:   Troponin I (High Sensitivity) 322 (*)    All other components within normal limits  CBC WITH DIFFERENTIAL/PLATELET  CBC  PHOSPHORUS  TSH  HEMOGLOBIN A1C     EKG None  Radiology DG Chest 2 View  Result Date: 06/14/2021 CLINICAL DATA:  Chest pain EXAM: CHEST - 2 VIEW COMPARISON:  None. FINDINGS: The heart size and mediastinal contours are within normal limits. Both lungs are clear. Aortic atherosclerosis. The visualized skeletal structures are unremarkable. IMPRESSION: No active cardiopulmonary disease. Electronically Signed   By: Donavan Foil M.D.   On: 06/14/2021 19:10    Procedures Procedures   Medications Ordered in ED Medications  acetaminophen (TYLENOL) tablet 650 mg (has no administration in time range)  ondansetron (ZOFRAN) injection 4 mg (has no administration in time range)  enoxaparin (LOVENOX) injection 40 mg (40 mg Subcutaneous Given 06/14/21 2158)  aspirin EC tablet 81 mg (81 mg Oral Given 06/15/21 0914)  clonazePAM (KLONOPIN) tablet 0.5 mg (0.5 mg Oral Given 06/14/21 2157)  leflunomide (ARAVA) tablet 20 mg (20 mg Oral Given 06/15/21 0912)  losartan (COZAAR) tablet 100 mg (100 mg Oral Given 06/15/21 0912)  modafinil (PROVIGIL) tablet 200 mg (100 mg Oral Patient Refused/Not Given 06/15/21 0913)  pantoprazole (PROTONIX) EC tablet 40 mg (40 mg Oral Given 06/15/21 0912)  darifenacin (ENABLEX)  24 hr tablet 7.5 mg (7.5 mg Oral Given 06/15/21 0914)  lidocaine (LIDODERM) 5 % 1 patch (has no administration in time range)  nitroGLYCERIN (NITROSTAT) SL tablet 0.4 mg (has no administration in time range)  potassium chloride (KLOR-CON) CR tablet 50 mEq (50 mEq Oral Given 06/15/21 0642)  magnesium sulfate 3 g in dextrose 5 % 100 mL IVPB (3 g Intravenous New Bag/Given 06/15/21 1043)    ED Course  I have reviewed the triage vital signs and the nursing notes.  Pertinent labs & imaging results that were available during my care of the patient were reviewed by me and considered in my medical decision making (see chart for details).    MDM Rules/Calculators/A&P HEAR Score: 5                        This is a 75 year old female with a  history of MS on leflunomide and spinal stenosis who presents with episodic chest pain as above.  She is afebrile and vitally stable.  Her EKG showed sinus rhythm with some nonspecific T wave changes.  Her hear score is 5, moderate risk. She has had no prior cardiac evaluation.  Initial troponin elevated to 182 concerning for NSTEMI. Delta trop pending. Patient received asa. No current chest pain. No indications for heparin gtt at this time.  She is not tachycardic or tachypneic.  She has no O2 requirement.  She has no other PE risk factors.  She has no swelling in her legs.  I have a low suspicion for PE at this time.  CXR showed no focal opacities, ptx, mediastinal widening. Discussed w/ medicine team. Will admit for further cardiac evaluation. Hand off given. Delta trop pending.   Final Clinical Impression(s) / ED Diagnoses Final diagnoses:  None    Rx / DC Orders ED Discharge Orders     None        Idamae Lusher, MD 06/15/21 1249    Luna Fuse, MD 06/19/21 1430

## 2021-06-14 NOTE — ED Notes (Signed)
Called floor to follow up, Nurse Cristie Hem stated he was looking at chart now

## 2021-06-14 NOTE — ED Triage Notes (Signed)
Pt presents with central chest pain with shortness of breath for a few hours today.

## 2021-06-15 ENCOUNTER — Observation Stay (HOSPITAL_BASED_OUTPATIENT_CLINIC_OR_DEPARTMENT_OTHER): Payer: Medicare Other

## 2021-06-15 DIAGNOSIS — I5031 Acute diastolic (congestive) heart failure: Secondary | ICD-10-CM | POA: Diagnosis not present

## 2021-06-15 DIAGNOSIS — G35 Multiple sclerosis: Secondary | ICD-10-CM | POA: Diagnosis not present

## 2021-06-15 DIAGNOSIS — I2489 Other forms of acute ischemic heart disease: Secondary | ICD-10-CM | POA: Diagnosis present

## 2021-06-15 DIAGNOSIS — R079 Chest pain, unspecified: Secondary | ICD-10-CM | POA: Diagnosis not present

## 2021-06-15 DIAGNOSIS — R778 Other specified abnormalities of plasma proteins: Secondary | ICD-10-CM

## 2021-06-15 DIAGNOSIS — I248 Other forms of acute ischemic heart disease: Secondary | ICD-10-CM | POA: Diagnosis not present

## 2021-06-15 DIAGNOSIS — I10 Essential (primary) hypertension: Secondary | ICD-10-CM | POA: Diagnosis not present

## 2021-06-15 LAB — LIPID PANEL
Cholesterol: 213 mg/dL — ABNORMAL HIGH (ref 0–200)
HDL: 44 mg/dL (ref >40)
LDL Cholesterol: 142 mg/dL — ABNORMAL HIGH (ref 0–99)
Total CHOL/HDL Ratio: 4.8 RATIO
Triglycerides: 133 mg/dL (ref <150)
VLDL: 27 mg/dL (ref 0–40)

## 2021-06-15 LAB — ECHOCARDIOGRAM COMPLETE
AR max vel: 2.08 cm2
AV Area VTI: 2.11 cm2
AV Area mean vel: 2.11 cm2
AV Mean grad: 4 mmHg
AV Peak grad: 6.9 mmHg
Ao pk vel: 1.31 m/s
Area-P 1/2: 2.76 cm2
Height: 62 in
S' Lateral: 3 cm
Single Plane A4C EF: 70.2 %
Weight: 2912 oz

## 2021-06-15 LAB — CBC
HCT: 36.3 % (ref 36.0–46.0)
Hemoglobin: 12 g/dL (ref 12.0–15.0)
MCH: 28.6 pg (ref 26.0–34.0)
MCHC: 33.1 g/dL (ref 30.0–36.0)
MCV: 86.6 fL (ref 80.0–100.0)
Platelets: 175 10*3/uL (ref 150–400)
RBC: 4.19 MIL/uL (ref 3.87–5.11)
RDW: 15.1 % (ref 11.5–15.5)
WBC: 5.3 10*3/uL (ref 4.0–10.5)
nRBC: 0 % (ref 0.0–0.2)

## 2021-06-15 LAB — TROPONIN I (HIGH SENSITIVITY)
Troponin I (High Sensitivity): 387 ng/L (ref <18)
Troponin I (High Sensitivity): 365 ng/L (ref <18)
Troponin I (High Sensitivity): 281 ng/L (ref <18)
Troponin I (High Sensitivity): 322 ng/L (ref <18)

## 2021-06-15 LAB — TSH: TSH: 4.439 u[IU]/mL (ref 0.350–4.500)

## 2021-06-15 LAB — PHOSPHORUS: Phosphorus: 3.9 mg/dL (ref 2.5–4.6)

## 2021-06-15 LAB — BASIC METABOLIC PANEL
Anion gap: 8 (ref 5–15)
BUN: 18 mg/dL (ref 8–23)
CO2: 22 mmol/L (ref 22–32)
Calcium: 8.9 mg/dL (ref 8.9–10.3)
Chloride: 108 mmol/L (ref 98–111)
Creatinine, Ser: 0.62 mg/dL (ref 0.44–1.00)
GFR, Estimated: >60 mL/min (ref >60)
Glucose, Bld: 85 mg/dL (ref 70–99)
Potassium: 3.3 mmol/L — ABNORMAL LOW (ref 3.5–5.1)
Sodium: 138 mmol/L (ref 135–145)

## 2021-06-15 LAB — MAGNESIUM: Magnesium: 1.6 mg/dL — ABNORMAL LOW (ref 1.7–2.4)

## 2021-06-15 MED ORDER — POTASSIUM CHLORIDE CRYS ER 20 MEQ PO TBCR
50.0000 meq | EXTENDED_RELEASE_TABLET | Freq: Two times a day (BID) | ORAL | Status: AC
  Administered 2021-06-15 (×2): 50 meq via ORAL
  Filled 2021-06-15 (×2): qty 2

## 2021-06-15 MED ORDER — MAGNESIUM SULFATE 50 % IJ SOLN
3.0000 g | Freq: Once | INTRAVENOUS | Status: AC
  Administered 2021-06-15: 3 g via INTRAVENOUS
  Filled 2021-06-15: qty 6

## 2021-06-15 MED ORDER — CARVEDILOL 3.125 MG PO TABS
3.1250 mg | ORAL_TABLET | Freq: Two times a day (BID) | ORAL | Status: DC
Start: 2021-06-15 — End: 2021-06-16
  Administered 2021-06-15: 3.125 mg via ORAL
  Filled 2021-06-15: qty 1

## 2021-06-15 NOTE — Progress Notes (Signed)
PROGRESS NOTE    Dawn [name]  NIO:[id number] DOB: 1946/01/05 DOA: 06/14/2021 PCP: Sandrea Hughs, NP   Brief Narrative:  75 y.o. WF PMHx multiple sclerosis, hypertension, history of CVA, hyperlipidemia, Sjogren's syndrome, chronic back pain   Presented to the ED for evaluation of chest pain.  Patient reports experiencing an episode of chest pain 2 days prior to admission.  She says it was across her sternum and lower rib cage area.  Pain felt like a squeezing sensation.  This was accompanied by a painful sensation from both of her elbows down to her hands.  Initial episode happened while she was doing chores around the house.  She had nausea but no emesis.  She felt chills but no diaphoresis.  Symptoms resolved after about 1 hour.  Patient had similar symptoms earlier today (11/25) again while she was active.  She tried to rest to relieve her symptoms however they were persistent for about 2 hours.  She had new shortness of breath today which is why she came to the hospital for further evaluation.  She is currently chest pain-free.  She says she does have occasional chest discomfort due to " MS hug" however these 2 recent episodes felt different to anything she experienced before.  She has also had a burning sensation in her throat which is new.   She underwent right total hip replacement 01/17/2021.  She was given SCDs and aspirin for DVT prophylaxis.  She has not had any swelling in her lower extremities.  She reports a family history of heart disease in her father who died at age 41 of a heart attack.  She is a former smoker, quit in 1987.  She denies any alcohol or illicit drug use.  She has not had any previous cardiac work-up. high-sensitivity troponin 182.  Subjective: A/O x4 states during her last episode of chest pain SBP= 210   Assessment & Plan:  Covid vaccination; vaccinated 3/3  Principal Problem:   Chest pain Active Problems:   MULTIPLE SCLEROSIS,  PROGRESSIVE/RELAPSING   Essential hypertension   Elevated troponin   Acute diastolic CHF (congestive heart failure) (HCC)   Demand ischemia (HCC)  Acute diastolic CHF - 81/82 see echocardiogram below - Strict in and out - Daily weight - Coreg 3.125 mg BID -Losartan 100 mg daily -11/26 patient will need follow-up cardiology appointment prior to discharge.  Does not appear she has a cardiologist assigned   Essential HTN - See CHF  Chest pain with elevated troponin: Atypical chest pain however has elevated troponin and nonspecific T wave changes in lead III.  Reports some difference in symptoms to her prior MS hugs. -Keep on telemetry, EKG as needed -Obtain echocardiogram -Continue aspirin 81 mg daily -Nitroglycerin as needed -Trend troponin  Latest Reference Range & Units Most Recent 06/14/21 17:50 06/14/21 19:50 06/14/21 22:49 06/15/21 02:56  Troponin I (High Sensitivity) <18 ng/L 322 (HH) 06/15/21 09:40 182 (HH) 316 (HH) 406 (HH) 387 (HH)  (HH): Data is critically high -Most likely secondary to patient's severely elevated SBP/uncontrolled HTN  Elevated troponin - Consistent with demand ischemia    Multiple sclerosis: Follows with neurology, Dr. Felecia Shelling.  Does not appear to have acute flareup. -Continue leflunomide, modafinil, clonazepam   History of CVA: Continue aspirin 81 mg daily.   Hyperlipidemia: Has reported intolerance to statins.  Previously on Zetia however this was held a couple months ago due to concern for potential side effect of muscle aches which resolved after stopping.  Chronic back pain: Continue Lidoderm patch as needed.  Hypokalemia -Potassium goal> 4 -11/26 K-Dur 50 mEq BID x2 doses  Hypomagnesmia - Magnesium goal> 2 - 11/26 magnesium IV 3 g  Obese (BMI 33.29 kg/m)    DVT prophylaxis: Lovenox Code Status: Full Family Communication:  Status is: Inpatient    Dispo: The patient is from: Home              Anticipated d/c is to:  Home              Anticipated d/c date is: 1 day              Patient currently is not medically stable to d/c.      Consultants:     Procedures/Significant Events:  11/26 Echocardiogram Left Ventricle: Left ventricular ejection fraction, by estimation, is 60  to 65%. The left ventricle has normal function. The left ventricle has no  regional wall motion abnormalities. The left ventricular internal cavity  size was normal in size. There is   mild left ventricular hypertrophy. Left ventricular diastolic parameters  are consistent with Grade I diastolic dysfunction (impaired relaxation).   Right Ventricle: The right ventricular size is normal. No increase in  right ventricular wall thickness. Right ventricular systolic function is  normal. Tricuspid regurgitation signal is inadequate for assessing PA  pressure.   Left Atrium: Left atrial size was normal in size.   Right Atrium: Right atrial size was normal in size.   Pericardium: There is no evidence of pericardial effusion. Presence of  epicardial fat layer.   Mitral Valve: The mitral valve is grossly normal. Mild to moderate mitral  annular calcification. Mild mitral valve regurgitation. No evidence of  mitral valve stenosis.   Tricuspid Valve: The tricuspid valve is normal in structure. Tricuspid  valve regurgitation is trivial. No evidence of tricuspid stenosis.   Aortic Valve: The aortic valve is grossly normal. There is mild  calcification of the aortic valve. Aortic valve regurgitation is not  visualized. No aortic stenosis is present. Aortic valve mean gradient  measures 4.0 mmHg. Aortic valve peak gradient  measures 6.9 mmHg. Aortic valve area, by VTI measures 2.11 cm.   Pulmonic Valve: The pulmonic valve was normal in structure. Pulmonic valve  regurgitation is not visualized. No evidence of pulmonic stenosis.   Aorta: The aortic root is normal in size and structure. There is atheroma  plaque involving the  aortic arch and descending aorta.   I have personally reviewed and interpreted all radiology studies and my findings are as above.  VENTILATOR SETTINGS:    Cultures   Antimicrobials:    Devices    LINES / TUBES:      Continuous Infusions:   Objective: Vitals:   06/15/21 0548 06/15/21 0911 06/15/21 1150 06/15/21 1605  BP: (!) 151/71 136/68 (!) 168/86 (!) 137/55  Pulse: 64 71 66 65  Resp: 18 18 17 18   Temp: 98.5 F (36.9 C)  98.1 F (36.7 C) 98.8 F (37.1 C)  TempSrc: Oral  Oral Oral  SpO2: 96% 96% 95% 97%  Weight:      Height:        Intake/Output Summary (Last 24 hours) at 06/15/2021 1711 Last data filed at 06/15/2021 1316 Gross per 24 hour  Intake 720 ml  Output 250 ml  Net 470 ml   Filed Weights   06/14/21 1743 06/14/21 2243  Weight: 81.6 kg 82.6 kg    Examination:  General: A/O x4, No acute  respiratory distress Eyes: negative scleral hemorrhage, negative anisocoria, negative icterus ENT: Negative Runny nose, negative gingival bleeding, Neck:  Negative scars, masses, torticollis, lymphadenopathy, JVD Lungs: Clear to auscultation bilaterally without wheezes or crackles Cardiovascular: Regular rate and rhythm without murmur gallop or rub normal S1 and S2 Abdomen: negative abdominal pain, nondistended, positive soft, bowel sounds, no rebound, no ascites, no appreciable mass Extremities: No significant cyanosis, clubbing, or edema bilateral lower extremities Skin: Negative rashes, lesions, ulcers Psychiatric:  Negative depression, negative anxiety, negative fatigue, negative mania  Central nervous system:  Cranial nerves II through XII intact, tongue/uvula midline, all extremities muscle strength 5/5, sensation intact throughout, negative dysarthria, negative expressive aphasia, negative receptive aphasia.  .     Data Reviewed: Care during the described time interval was provided by me .  I have reviewed this patient's available data, including  medical history, events of note, physical examination, and all test results as part of my evaluation.   CBC: Recent Labs  Lab 06/14/21 1750 06/15/21 0256  WBC 7.5 5.3  NEUTROABS 5.7  --   HGB 13.2 12.0  HCT 39.7 36.3  MCV 85.7 86.6  PLT 223 622   Basic Metabolic Panel: Recent Labs  Lab 06/14/21 1750 06/15/21 0256 06/15/21 0638  NA 137 138  --   K 3.8 3.3*  --   CL 108 108  --   CO2 21* 22  --   GLUCOSE 87 85  --   BUN 16 18  --   CREATININE 0.62 0.62  --   CALCIUM 9.4 8.9  --   MG  --   --  1.6*  PHOS  --   --  3.9   GFR: Estimated Creatinine Clearance: 62.4 mL/min (by C-G formula based on SCr of 0.62 mg/dL). Liver Function Tests: No results for input(s): AST, ALT, ALKPHOS, BILITOT, PROT, ALBUMIN in the last 168 hours. No results for input(s): LIPASE, AMYLASE in the last 168 hours. No results for input(s): AMMONIA in the last 168 hours. Coagulation Profile: No results for input(s): INR, PROTIME in the last 168 hours. Cardiac Enzymes: No results for input(s): CKTOTAL, CKMB, CKMBINDEX, TROPONINI in the last 168 hours. BNP (last 3 results) No results for input(s): PROBNP in the last 8760 hours. HbA1C: No results for input(s): HGBA1C in the last 72 hours. CBG: No results for input(s): GLUCAP in the last 168 hours. Lipid Profile: Recent Labs    06/15/21 0256  CHOL 213*  HDL 44  LDLCALC 142*  TRIG 133  CHOLHDL 4.8   Thyroid Function Tests: Recent Labs    06/15/21 0638  TSH 4.439   Anemia Panel: No results for input(s): VITAMINB12, FOLATE, FERRITIN, TIBC, IRON, RETICCTPCT in the last 72 hours. Urine analysis:    Component Value Date/Time   COLORURINE AMBER (A) 01/07/2021 1122   APPEARANCEUR CLEAR 01/07/2021 1122   APPEARANCEUR Clear 08/25/2018 1215   LABSPEC 1.010 01/07/2021 1122   PHURINE 5.0 01/07/2021 1122   GLUCOSEU NEGATIVE 01/07/2021 1122   GLUCOSEU NEGATIVE 12/05/2009 0804   HGBUR NEGATIVE 01/07/2021 1122   HGBUR trace-lysed 08/31/2009 1319    BILIRUBINUR NEGATIVE 01/07/2021 1122   BILIRUBINUR Negative 08/25/2018 [place] 01/07/2021 1122   PROTEINUR NEGATIVE 01/07/2021 1122   UROBILINOGEN 0.2 10/13/2011 1531   NITRITE POSITIVE (A) 01/07/2021 1122   LEUKOCYTESUR NEGATIVE 01/07/2021 1122   Sepsis Labs: @LABRCNTIP (procalcitonin:4,lacticidven:4)  )No results found for this or any previous visit (from the past 240 hour(s)).  Radiology Studies: DG Chest 2 View  Result Date: 06/14/2021 CLINICAL DATA:  Chest pain EXAM: CHEST - 2 VIEW COMPARISON:  None. FINDINGS: The heart size and mediastinal contours are within normal limits. Both lungs are clear. Aortic atherosclerosis. The visualized skeletal structures are unremarkable. IMPRESSION: No active cardiopulmonary disease. Electronically Signed   By: Donavan Foil M.D.   On: 06/14/2021 19:10   ECHOCARDIOGRAM COMPLETE  Result Date: 06/15/2021    ECHOCARDIOGRAM REPORT   Patient Name:   AMULYA QUINTIN Gaudin Date of Exam: 06/15/2021 Medical Rec #:  [id number]         Height:       62.0 in Accession #:    [id number]        Weight:       182.0 lb Date of Birth:  August 03, 1945          BSA:          1.837 m Patient Age:    42 years          BP:           136/68 mmHg Patient Gender: F                 HR:           70 bpm. Exam Location:  Inpatient Procedure: 2D Echo, Cardiac Doppler and Color Doppler Indications:    Chest pain  History:        Patient has no prior history of Echocardiogram examinations.                 Signs/Symptoms:Chest Pain and Dizziness/Lightheadedness; Risk                 Factors:Hypertension. Vertigo. Arm numbness.  Sonographer:    Merrie Roof RDCS Referring Phys: [id number] [place]  1. Left ventricular ejection fraction, by estimation, is 60 to 65%. The left ventricle has normal function. The left ventricle has no regional wall motion abnormalities. There is mild left ventricular hypertrophy. Left ventricular diastolic parameters are  consistent with Grade I diastolic dysfunction (impaired relaxation).  2. Right ventricular systolic function is normal. The right ventricular size is normal. Tricuspid regurgitation signal is inadequate for assessing PA pressure.  3. The mitral valve is grossly normal. Mild mitral valve regurgitation. No evidence of mitral stenosis.  4. The aortic valve is grossly normal. There is mild calcification of the aortic valve. Aortic valve regurgitation is not visualized. No aortic stenosis is present.  5. The inferior vena cava is normal in size with greater than 50% respiratory variability, suggesting right atrial pressure of 3 mmHg. FINDINGS  Left Ventricle: Left ventricular ejection fraction, by estimation, is 60 to 65%. The left ventricle has normal function. The left ventricle has no regional wall motion abnormalities. The left ventricular internal cavity size was normal in size. There is  mild left ventricular hypertrophy. Left ventricular diastolic parameters are consistent with Grade I diastolic dysfunction (impaired relaxation). Right Ventricle: The right ventricular size is normal. No increase in right ventricular wall thickness. Right ventricular systolic function is normal. Tricuspid regurgitation signal is inadequate for assessing PA pressure. Left Atrium: Left atrial size was normal in size. Right Atrium: Right atrial size was normal in size. Pericardium: There is no evidence of pericardial effusion. Presence of epicardial fat layer. Mitral Valve: The mitral valve is grossly normal. Mild to moderate mitral annular calcification. Mild mitral valve regurgitation. No evidence of mitral valve stenosis. Tricuspid Valve: The tricuspid valve is  normal in structure. Tricuspid valve regurgitation is trivial. No evidence of tricuspid stenosis. Aortic Valve: The aortic valve is grossly normal. There is mild calcification of the aortic valve. Aortic valve regurgitation is not visualized. No aortic stenosis is present.  Aortic valve mean gradient measures 4.0 mmHg. Aortic valve peak gradient measures 6.9 mmHg. Aortic valve area, by VTI measures 2.11 cm. Pulmonic Valve: The pulmonic valve was normal in structure. Pulmonic valve regurgitation is not visualized. No evidence of pulmonic stenosis. Aorta: The aortic root is normal in size and structure. There is atheroma plaque involving the aortic arch and descending aorta. Venous: The inferior vena cava is normal in size with greater than 50% respiratory variability, suggesting right atrial pressure of 3 mmHg. IAS/Shunts: No atrial level shunt detected by color flow Doppler.  LEFT VENTRICLE PLAX 2D LVIDd:         4.60 cm     Diastology LVIDs:         3.00 cm     LV e' medial:    7.51 cm/s LV PW:         1.10 cm     LV E/e' medial:  10.5 LV IVS:        1.10 cm     LV e' lateral:   9.46 cm/s LVOT diam:     1.80 cm     LV E/e' lateral: 8.3 LV SV:         64 LV SV Index:   35 LVOT Area:     2.54 cm  LV Volumes (MOD) LV vol d, MOD A4C: 84.7 ml LV vol s, MOD A4C: 25.2 ml LV SV MOD A4C:     84.7 ml RIGHT VENTRICLE          IVC RV Basal diam:  4.00 cm  IVC diam: 1.90 cm LEFT ATRIUM             Index        RIGHT ATRIUM           Index LA diam:        3.70 cm 2.01 cm/m   RA Area:     11.70 cm LA Vol (A2C):   51.3 ml 27.93 ml/m  RA Volume:   22.40 ml  12.20 ml/m LA Vol (A4C):   39.5 ml 21.51 ml/m LA Biplane Vol: 48.7 ml 26.52 ml/m  AORTIC VALVE AV Area (Vmax):    2.08 cm AV Area (Vmean):   2.11 cm AV Area (VTI):     2.11 cm AV Vmax:           131.00 cm/s AV Vmean:          89.800 cm/s AV VTI:            0.303 m AV Peak Grad:      6.9 mmHg AV Mean Grad:      4.0 mmHg LVOT Vmax:         107.00 cm/s LVOT Vmean:        74.400 cm/s LVOT VTI:          0.251 m LVOT/AV VTI ratio: 0.83  AORTA Ao Root diam: 3.10 cm MITRAL VALVE MV Area (PHT): 2.76 cm     SHUNTS MV Decel Time: 275 msec     Systemic VTI:  0.25 m MV E velocity: 78.60 cm/s   Systemic Diam: 1.80 cm MV A velocity: 124.00 cm/s MV E/A  ratio:  0.63 Cherlynn Kaiser MD Electronically signed by Cherlynn Kaiser  MD Signature Date/Time: 06/15/2021/1:25:52 PM    Final         Scheduled Meds:  aspirin EC  81 mg Oral Daily   carvedilol  3.125 mg Oral BID WC   clonazePAM  0.5 mg Oral QHS   darifenacin  7.5 mg Oral Daily   enoxaparin (LOVENOX) injection  40 mg Subcutaneous Q24H   leflunomide  20 mg Oral Daily   losartan  100 mg Oral Daily   modafinil  200 mg Oral Daily   pantoprazole  40 mg Oral Daily   potassium chloride  50 mEq Oral BID   Continuous Infusions:   LOS: 0 days   The patient is critically ill with multiple organ systems failure and requires high complexity decision making for assessment and support, frequent evaluation and titration of therapies, application of advanced monitoring technologies and extensive interpretation of multiple databases. Critical Care Time devoted to patient care services described in this note  Time spent: 40 minutes     Keyontay Stolz, Geraldo Docker, MD [facility]   If 7PM-7AM, please contact night-coverage 06/15/2021, 5:11 PM

## 2021-06-15 NOTE — Care Management Obs Status (Signed)
[facility] NOTIFICATION   Patient Details  Name: KAMARAH BILOTTA MRN: [id number] Date of Birth: 28-Aug-1945   Medicare Observation Status Notification Given:  Yes  Patient given notice over phone, did not want to sign. Copy attached to DC isntructions  Verdell Carmine, RN 06/15/2021, 3:03 PM

## 2021-06-15 NOTE — Progress Notes (Signed)
Dr. Sherral Hammers notified K 3.3 and Mg 1.6 this am.

## 2021-06-15 NOTE — Progress Notes (Signed)
  Echocardiogram 2D Echocardiogram has been performed.  Merrie Roof F 06/15/2021, 10:22 AM

## 2021-06-15 NOTE — Discharge Instructions (Signed)
Medicare Outpatient Observation Notice   Patient name:  Dawn [name] Patient number:  [id number]                                                                                                                                                                       You're a hospital outpatient receiving observation services. You are not an inpatient because:    chest pain   You require hospital care for evaluation and/or treatment.  It is expected you will need hospital care for less than a total of two days.                                                                                                                                                                         Being an outpatient may affect what you pay in a hospital:   When you're a hospital outpatient, your observation stay is covered under Medicare Part B.   For Part B services, you generally pay:   A copayment for each outpatient hospital service you get. Part B copayments may vary by type of service.   20% of the Medicare-approved amount for most doctor services, after the Part B deductible.   Observation services may affect coverage and payment of your care after you leave the hospital:     If you need skilled nursing facility (SNF) care after you leave the hospital, Medicare Part A will only cover SNF care if you've had a 3-day minimum, medically necessary, inpatient hospital stay for a related illness or injury. An inpatient hospital stay begins the day the hospital admits you as an inpatient based on a doctor's order and doesn't include the day you're discharged.   If you have Medicaid, a Medicare Advantage plan or other health plan, Medicaid or the plan may have different rules for SNF coverage after you leave the hospital. Check with Medicaid or your plan.   NOTE: Medicare Part A generally doesn't cover outpatient hospital services,  like an observation stay. However, Part A will generally cover medically  necessary inpatient services if the hospital admits you as an inpatient based on a doctor's order. In most cases, you'll pay a one-time deductible for all of your inpatient hospital services for the first 60 days you're in a hospital.                                                                                                                                                                      If you have any questions about your observation services, ask the hospital staff member giving you this notice or the doctor providing your hospital care. You can also ask to speak with someone from the hospital's utilization or discharge planning department.   You can also call 1-800-MEDICARE (1-313-524-4555).  TTY users should call 5314378046.   Form CMS 44034-VQQV   Expiration 07/20/2021 [employer] APPROVAL [id number]          Your costs for medications:     Generally, prescription and over-the-counter drugs, including "self-administered drugs," you get in a hospital outpatient setting (like an emergency department) aren't covered by Part B. "Self- administered drugs" are drugs you'd normally take on your own. For safety reasons, many hospitals don't allow you to take medications brought from home. If you have a Medicare prescription drug plan (Part D), your plan may help you pay for these drugs. You'll likely need to pay out-of- pocket for these drugs and submit a claim to your drug plan for a refund. Contact your drug plan for more information.                                                                                                                                                                        If you're enrolled in a Medicare Advantage plan (like an HMO or PPO) or other Medicare health plan (Part C), your costs and coverage may be different. Check with your plan to find out about coverage  for outpatient observation services.    If you're a Qualified Medicare Beneficiary through your  state Medicaid program, you can't be billed for Part A or Part B deductibles, coinsurance, and copayments.                                                                                                                                                                      Additional Information (Optional):                                                                                                                                                                                Please sign below to show you received and understand this notice.                                         Date: 06/15/21 / Time:3:02 PM   CMS does not discriminate in its programs and activities. To request this publication in alternative format, please call: 1-800-MEDICARE or email:[email or web address].   Form CMS 76546-TKPT   Expiration 07/20/2021 [employer] APPROVAL [id number]      Patient   Add No image attached Trace Slow Corrupt Edit Data Change Template Print On

## 2021-06-15 NOTE — Progress Notes (Signed)
Admission  Admitted from ED for episodes of chest pain; transported via stretcher with patient transporter. Assessment and vitals completed. Pain and I&O assessed. Patient oriented to room and staff. Placed on continuous cardiac monitoring. Meds given per order. Call bell within reach. Will continue to monitor.

## 2021-06-15 NOTE — Plan of Care (Signed)
Pleasant and in good spirits. BP elevated but other vitals stable. No complaints of pain or shortness of breath.   Problem: Education: Goal: Knowledge of General Education information will improve Description: Including pain rating scale, medication(s)/side effects and non-pharmacologic comfort measures Outcome: Progressing   Problem: Health Behavior/Discharge Planning: Goal: Ability to manage health-related needs will improve Outcome: Progressing   Problem: Clinical Measurements: Goal: Ability to maintain clinical measurements within normal limits will improve Outcome: Progressing Goal: Will remain free from infection Outcome: Progressing Goal: Diagnostic test results will improve Outcome: Progressing Goal: Respiratory complications will improve Outcome: Progressing Goal: Cardiovascular complication will be avoided Outcome: Progressing   Problem: Activity: Goal: Risk for activity intolerance will decrease Outcome: Progressing   Problem: Nutrition: Goal: Adequate nutrition will be maintained Outcome: Progressing   Problem: Coping: Goal: Level of anxiety will decrease Outcome: Progressing   Problem: Elimination: Goal: Will not experience complications related to bowel motility Outcome: Progressing Goal: Will not experience complications related to urinary retention Outcome: Progressing   Problem: Pain Managment: Goal: General experience of comfort will improve Outcome: Progressing   Problem: Safety: Goal: Ability to remain free from injury will improve Outcome: Progressing   Problem: Skin Integrity: Goal: Risk for impaired skin integrity will decrease Outcome: Progressing

## 2021-06-16 DIAGNOSIS — R079 Chest pain, unspecified: Secondary | ICD-10-CM | POA: Diagnosis not present

## 2021-06-16 DIAGNOSIS — I5031 Acute diastolic (congestive) heart failure: Secondary | ICD-10-CM | POA: Diagnosis not present

## 2021-06-16 DIAGNOSIS — G35 Multiple sclerosis: Secondary | ICD-10-CM | POA: Diagnosis not present

## 2021-06-16 DIAGNOSIS — I10 Essential (primary) hypertension: Secondary | ICD-10-CM | POA: Diagnosis not present

## 2021-06-16 DIAGNOSIS — I248 Other forms of acute ischemic heart disease: Secondary | ICD-10-CM | POA: Diagnosis not present

## 2021-06-16 DIAGNOSIS — R778 Other specified abnormalities of plasma proteins: Secondary | ICD-10-CM | POA: Diagnosis not present

## 2021-06-16 LAB — COMPREHENSIVE METABOLIC PANEL
ALT: 16 U/L (ref 0–44)
AST: 17 U/L (ref 15–41)
Albumin: 3.4 g/dL — ABNORMAL LOW (ref 3.5–5.0)
Alkaline Phosphatase: 101 U/L (ref 38–126)
Anion gap: 5 (ref 5–15)
BUN: 16 mg/dL (ref 8–23)
CO2: 24 mmol/L (ref 22–32)
Calcium: 8.7 mg/dL — ABNORMAL LOW (ref 8.9–10.3)
Chloride: 109 mmol/L (ref 98–111)
Creatinine, Ser: 0.72 mg/dL (ref 0.44–1.00)
GFR, Estimated: >60 mL/min (ref >60)
Glucose, Bld: 91 mg/dL (ref 70–99)
Potassium: 4.5 mmol/L (ref 3.5–5.1)
Sodium: 138 mmol/L (ref 135–145)
Total Bilirubin: 0.4 mg/dL (ref 0.3–1.2)
Total Protein: 5.6 g/dL — ABNORMAL LOW (ref 6.5–8.1)

## 2021-06-16 LAB — CBC WITH DIFFERENTIAL/PLATELET
Abs Immature Granulocytes: 0.01 10*3/uL (ref 0.00–0.07)
Basophils Absolute: 0.1 10*3/uL (ref 0.0–0.1)
Basophils Relative: 1 %
Eosinophils Absolute: 0.2 10*3/uL (ref 0.0–0.5)
Eosinophils Relative: 4 %
HCT: 38.4 % (ref 36.0–46.0)
Hemoglobin: 12.3 g/dL (ref 12.0–15.0)
Immature Granulocytes: 0 %
Lymphocytes Relative: 25 %
Lymphs Abs: 1.2 10*3/uL (ref 0.7–4.0)
MCH: 28.1 pg (ref 26.0–34.0)
MCHC: 32 g/dL (ref 30.0–36.0)
MCV: 87.7 fL (ref 80.0–100.0)
Monocytes Absolute: 0.5 10*3/uL (ref 0.1–1.0)
Monocytes Relative: 10 %
Neutro Abs: 2.8 10*3/uL (ref 1.7–7.7)
Neutrophils Relative %: 60 %
Platelets: 190 10*3/uL (ref 150–400)
RBC: 4.38 MIL/uL (ref 3.87–5.11)
RDW: 15.3 % (ref 11.5–15.5)
WBC: 4.7 10*3/uL (ref 4.0–10.5)
nRBC: 0 % (ref 0.0–0.2)

## 2021-06-16 LAB — MAGNESIUM: Magnesium: 2.3 mg/dL (ref 1.7–2.4)

## 2021-06-16 LAB — PHOSPHORUS: Phosphorus: 3.9 mg/dL (ref 2.5–4.6)

## 2021-06-16 MED ORDER — HYDRALAZINE HCL 10 MG PO TABS: 10.0000 mg | ORAL_TABLET | Freq: Three times a day (TID) | ORAL | 0 refills | Status: DC

## 2021-06-16 MED ORDER — CARVEDILOL 6.25 MG PO TABS: 6.2500 mg | ORAL_TABLET | Freq: Two times a day (BID) | ORAL | 0 refills | Status: DC

## 2021-06-16 MED ORDER — NITROGLYCERIN 0.4 MG SL SUBL: 0.4000 mg | SUBLINGUAL_TABLET | SUBLINGUAL | 0 refills | Status: DC | PRN

## 2021-06-16 MED ORDER — CARVEDILOL 6.25 MG PO TABS
6.2500 mg | ORAL_TABLET | Freq: Two times a day (BID) | ORAL | Status: DC
  Administered 2021-06-16: 10:00:00 6.25 mg via ORAL
  Filled 2021-06-16: qty 1

## 2021-06-16 MED ORDER — HYDRALAZINE HCL 10 MG PO TABS
10.0000 mg | ORAL_TABLET | Freq: Three times a day (TID) | ORAL | Status: DC
  Administered 2021-06-16: 10:00:00 10 mg via ORAL
  Filled 2021-06-16: qty 1

## 2021-06-16 NOTE — Progress Notes (Signed)
Patient refusing coreg and hydralazine, has concerns about adding new blood pressure medications and would like to talk to Dr. Sherral Hammers regarding these medications. Dr. Sherral Hammers notified.

## 2021-06-16 NOTE — Discharge Summary (Signed)
Physician Discharge Summary  Dawn [name] ZOX:[id number] DOB: 1945-09-14 DOA: 06/14/2021  PCP: Sandrea Hughs, NP  Admit date: 06/14/2021 Discharge date: 06/16/2021  Time spent: 35 minutes  Recommendations for Outpatient Follow-up:   Acute diastolic CHF - 81/19 see echocardiogram below - Strict in and out - Daily weight -11/27 increase Coreg 6.25 mg BID -11/27 Hydralazine 10 mg TID -Losartan 100 mg daily - 11/27 In 1 to 2 weeks establish care appointment at [facility] for new onset grade 1 diastolic CHF, uncontrolled hypertension   Essential HTN - See CHF   Chest pain with elevated troponin: Atypical chest pain however has elevated troponin and nonspecific T wave changes in lead III.  Reports some difference in symptoms to her prior MS hugs. -Keep on telemetry, EKG as needed -Obtain echocardiogram -Continue aspirin 81 mg daily -Nitroglycerin as needed -Trend troponin .  Latest Reference Range & Units Most Recent 06/15/21 02:56 06/15/21 06:38 06/15/21 08:10 06/15/21 09:40  Troponin I (High Sensitivity) <18 ng/L 322 (HH) 06/15/21 09:40 387 (HH) 365 (HH) 281 (HH) 322 (HH)  (HH): Data is critically high  Elevated troponin - Consistent with demand ischemia     Multiple sclerosis: Follows with neurology, Dr. Felecia Shelling.  Does not appear to have acute flareup. -Continue leflunomide, modafinil, clonazepam   History of CVA: Continue aspirin 81 mg daily.   Hyperlipidemia: - 11/26 LDL= 142 - Has reported intolerance to statins.  Previously on Zetia however this was held a couple months ago due to concern for potential side effect of muscle aches which resolved after stopping.   Chronic back pain: Continue Lidoderm patch as needed.   Hypokalemia -Potassium goal> 4   Hypomagnesmia - Magnesium goal> 2     Obese (BMI 33.29 kg/m)      Discharge Diagnoses:  Principal Problem:   Chest pain Active Problems:   MULTIPLE SCLEROSIS,  PROGRESSIVE/RELAPSING   Essential hypertension   Elevated troponin   Acute diastolic CHF (congestive heart failure) ([place])   Demand ischemia [facility])   Discharge Condition: Stable  Diet recommendation: Heart healthy  Filed Weights   06/14/21 1743 06/14/21 2243  Weight: 81.6 kg 82.6 kg    History of present illness:  75 y.o. WF PMHx multiple sclerosis, hypertension, history of CVA, hyperlipidemia, Sjogren's syndrome, chronic back pain    Presented to the ED for evaluation of chest pain.  Patient reports experiencing an episode of chest pain 2 days prior to admission.  She says it was across her sternum and lower rib cage area.  Pain felt like a squeezing sensation.  This was accompanied by a painful sensation from both of her elbows down to her hands.  Initial episode happened while she was doing chores around the house.  She had nausea but no emesis.  She felt chills but no diaphoresis.  Symptoms resolved after about 1 hour.  Patient had similar symptoms earlier today (11/25) again while she was active.  She tried to rest to relieve her symptoms however they were persistent for about 2 hours.  She had new shortness of breath today which is why she came to the hospital for further evaluation.  She is currently chest pain-free.  She says she does have occasional chest discomfort due to " MS hug" however these 2 recent episodes felt different to anything she experienced before.  She has also had a burning sensation in her throat which is new.   She underwent right total hip replacement 01/17/2021.  She was  given SCDs and aspirin for DVT prophylaxis.  She has not had any swelling in her lower extremities.  She reports a family history of heart disease in her father who died at age 53 of a heart attack.  She is a former smoker, quit in 1987.  She denies any alcohol or illicit drug use.  She has not had any previous cardiac work-up. high-sensitivity troponin 182.  Hospital Course:  See  above  Procedures: 11/26 Echocardiogram Left Ventricle: LVEF= 60 to 65%.  Left ventricular diastolic parameters are consistent with Grade I diastolic dysfunction (impaired relaxation).  Aorta: The aortic root is normal in size and structure. There is atheroma plaque involving the aortic arch and descending aorta.       Discharge Exam: Vitals:   06/16/21 0112 06/16/21 0551 06/16/21 0855 06/16/21 1147  BP: (!) 148/72 (!) 163/72 137/84 (!) 155/60  Pulse: 68 71 74   Resp: 18 18 18 18   Temp: 97.9 F (36.6 C) 98 F (36.7 C) 98.3 F (36.8 C) 98.2 F (36.8 C)  TempSrc: Oral Oral Oral Oral  SpO2: 96% 95% 95%   Weight:      Height:        General: A/O x4, No acute respiratory distress Eyes: negative scleral hemorrhage, negative anisocoria, negative icterus ENT: Negative Runny nose, negative gingival bleeding, Neck:  Negative scars, masses, torticollis, lymphadenopathy, JVD Lungs: Clear to auscultation bilaterally without wheezes or crackles Cardiovascular: Regular rate and rhythm without murmur gallop or rub normal S1 and S2  Discharge Instructions   Allergies as of 06/16/2021       Reactions   Statins Other (See Comments)   Muscle pain        Medication List     STOP taking these medications    ezetimibe 10 MG tablet Commonly known as: Zetia   meclizine 25 MG tablet Commonly known as: ANTIVERT       TAKE these medications    aspirin EC 81 MG tablet Take 81 mg by mouth daily. Swallow whole.   CALCIUM-VITAMIN D PO Take 1 tablet by mouth in the morning.   carvedilol 6.25 MG tablet Commonly known as: COREG Take 1 tablet (6.25 mg total) by mouth 2 (two) times daily with a meal.   clonazePAM 0.5 MG tablet Commonly known as: KLONOPIN TAKE 1 TABLET(0.5 MG) BY MOUTH AT BEDTIME What changed: See the new instructions.   fexofenadine 180 MG tablet Commonly known as: ALLEGRA Take 180 mg by mouth at bedtime.   FLAXSEED OIL PO Take 5-10 mLs by mouth 3  (three) times a week.   hydrALAZINE 10 MG tablet Commonly known as: APRESOLINE Take 1 tablet (10 mg total) by mouth every 8 (eight) hours.   leflunomide 20 MG tablet Commonly known as: ARAVA Take 1 tablet (20 mg total) by mouth daily.   lidocaine 5 % Commonly known as: LIDODERM Place 1 patch onto the skin daily as needed (mild pain). Remove & Discard patch within 12 hours or as directed by MD   losartan 100 MG tablet Commonly known as: COZAAR Take 1 tablet (100 mg total) by mouth daily.   modafinil 200 MG tablet Commonly known as: PROVIGIL Take 1 tablet (200 mg total) by mouth daily.   nitroGLYCERIN 0.4 MG SL tablet Commonly known as: NITROSTAT Place 1 tablet (0.4 mg total) under the tongue every 5 (five) minutes as needed for chest pain.   Omega-3 300 MG Caps Take 300 mg by mouth in the morning.  omeprazole 40 MG capsule Commonly known as: PRILOSEC TAKE 1 CAPSULE(40 MG) BY MOUTH DAILY What changed: See the new instructions.   solifenacin 5 MG tablet Commonly known as: VESICARE Take 1 tablet (5 mg total) by mouth daily.       Allergies  Allergen Reactions   Statins Other (See Comments)    Muscle pain    Follow-up Information     [facility] Follow up in 2 week(s).   Why: In 1 to 2 weeks establish care appointment for new onset grade 1 diastolic CHF, uncontrolled hypertension Contact information: [place] [address] [phone]                 The results of significant diagnostics from this hospitalization (including imaging, microbiology, ancillary and laboratory) are listed below for reference.    Significant Diagnostic Studies: DG Chest 2 View  Result Date: 06/14/2021 CLINICAL DATA:  Chest pain EXAM: CHEST - 2 VIEW COMPARISON:  None. FINDINGS: The heart size and mediastinal contours are within normal limits. Both lungs are clear. Aortic atherosclerosis.  The visualized skeletal structures are unremarkable. IMPRESSION: No active cardiopulmonary disease. Electronically Signed   By: Donavan Foil M.D.   On: 06/14/2021 19:10   MR LUMBAR SPINE WO CONTRAST  Result Date: 05/27/2021 CLINICAL DATA:  Bilateral leg weakness for 10 years. Back pain, bilateral hip pain EXAM: MRI LUMBAR SPINE WITHOUT CONTRAST TECHNIQUE: Multiplanar, multisequence MR imaging of the lumbar spine was performed. No intravenous contrast was administered. COMPARISON:  11/09/2017 FINDINGS: Segmentation:  Standard. Alignment:  3 mm retrolisthesis of L2 on L3. Vertebrae: No acute fracture, evidence of discitis, or aggressive bone lesion. Conus medullaris and cauda equina: Conus extends to the T12-L1 level. Conus and cauda equina appear normal. Paraspinal and other soft tissues: No acute paraspinal abnormality. Small right renal cyst. Disc levels: Disc spaces: Degenerative disease with disc height loss at T11-12, T12-L1, L2-3, L3-4 and L5-S1. T12-L1: No significant disc bulge. No neural foraminal stenosis. No central canal stenosis. L1-L2: No significant disc bulge. No neural foraminal stenosis. No central canal stenosis. L2-L3: Broad-based disc bulge eccentric towards the left. Mild bilateral facet arthropathy. Left subarticular recess stenosis. Moderate left foraminal stenosis. Mild right foraminal stenosis. Mild spinal stenosis. L3-L4: Mild broad-based disc bulge. Mild bilateral facet arthropathy. Moderate right and mild left foraminal stenosis. Mild spinal stenosis. L4-L5: Broad-based disc bulge flattening the ventral thecal sac. Bilateral lateral recess stenosis. Mild bilateral facet arthropathy. Moderate bilateral foraminal stenosis. Mild spinal stenosis. L5-S1: Mild broad-based disc bulge. Moderate right and mild left facet arthropathy. Severe right foraminal stenosis. No left foraminal stenosis. No central canal stenosis. IMPRESSION: 1. Lumbar spine spondylosis as described above. 2. No acute  osseous injury of the lumbar spine. Electronically Signed   By: Kathreen Devoid M.D.   On: 05/27/2021 10:36   ECHOCARDIOGRAM COMPLETE  Result Date: 06/15/2021    ECHOCARDIOGRAM REPORT   Patient Name:   [name] Date of Exam: 06/15/2021 Medical Rec #:  [id number]         Height:       62.0 in Accession #:    [id number]        Weight:       182.0 lb Date of Birth:  Apr 22, 1946          BSA:          1.837 m Patient Age:    75 years  BP:           136/68 mmHg Patient Gender: F                 HR:           70 bpm. Exam Location:  Inpatient Procedure: 2D Echo, Cardiac Doppler and Color Doppler Indications:    Chest pain  History:        Patient has no prior history of Echocardiogram examinations.                 Signs/Symptoms:Chest Pain and Dizziness/Lightheadedness; Risk                 Factors:Hypertension. Vertigo. Arm numbness.  Sonographer:    Merrie Roof RDCS Referring Phys: [id number] [place]  1. Left ventricular ejection fraction, by estimation, is 60 to 65%. The left ventricle has normal function. The left ventricle has no regional wall motion abnormalities. There is mild left ventricular hypertrophy. Left ventricular diastolic parameters are consistent with Grade I diastolic dysfunction (impaired relaxation).  2. Right ventricular systolic function is normal. The right ventricular size is normal. Tricuspid regurgitation signal is inadequate for assessing PA pressure.  3. The mitral valve is grossly normal. Mild mitral valve regurgitation. No evidence of mitral stenosis.  4. The aortic valve is grossly normal. There is mild calcification of the aortic valve. Aortic valve regurgitation is not visualized. No aortic stenosis is present.  5. The inferior vena cava is normal in size with greater than 50% respiratory variability, suggesting right atrial pressure of 3 mmHg. FINDINGS  Left Ventricle: Left ventricular ejection fraction, by estimation, is 60 to 65%. The left ventricle has  normal function. The left ventricle has no regional wall motion abnormalities. The left ventricular internal cavity size was normal in size. There is  mild left ventricular hypertrophy. Left ventricular diastolic parameters are consistent with Grade I diastolic dysfunction (impaired relaxation). Right Ventricle: The right ventricular size is normal. No increase in right ventricular wall thickness. Right ventricular systolic function is normal. Tricuspid regurgitation signal is inadequate for assessing PA pressure. Left Atrium: Left atrial size was normal in size. Right Atrium: Right atrial size was normal in size. Pericardium: There is no evidence of pericardial effusion. Presence of epicardial fat layer. Mitral Valve: The mitral valve is grossly normal. Mild to moderate mitral annular calcification. Mild mitral valve regurgitation. No evidence of mitral valve stenosis. Tricuspid Valve: The tricuspid valve is normal in structure. Tricuspid valve regurgitation is trivial. No evidence of tricuspid stenosis. Aortic Valve: The aortic valve is grossly normal. There is mild calcification of the aortic valve. Aortic valve regurgitation is not visualized. No aortic stenosis is present. Aortic valve mean gradient measures 4.0 mmHg. Aortic valve peak gradient measures 6.9 mmHg. Aortic valve area, by VTI measures 2.11 cm. Pulmonic Valve: The pulmonic valve was normal in structure. Pulmonic valve regurgitation is not visualized. No evidence of pulmonic stenosis. Aorta: The aortic root is normal in size and structure. There is atheroma plaque involving the aortic arch and descending aorta. Venous: The inferior vena cava is normal in size with greater than 50% respiratory variability, suggesting right atrial pressure of 3 mmHg. IAS/Shunts: No atrial level shunt detected by color flow Doppler.  LEFT VENTRICLE PLAX 2D LVIDd:         4.60 cm     Diastology LVIDs:         3.00 cm     LV e' medial:  7.51 cm/s LV PW:         1.10 cm      LV E/e' medial:  10.5 LV IVS:        1.10 cm     LV e' lateral:   9.46 cm/s LVOT diam:     1.80 cm     LV E/e' lateral: 8.3 LV SV:         64 LV SV Index:   35 LVOT Area:     2.54 cm  LV Volumes (MOD) LV vol d, MOD A4C: 84.7 ml LV vol s, MOD A4C: 25.2 ml LV SV MOD A4C:     84.7 ml RIGHT VENTRICLE          IVC RV Basal diam:  4.00 cm  IVC diam: 1.90 cm LEFT ATRIUM             Index        RIGHT ATRIUM           Index LA diam:        3.70 cm 2.01 cm/m   RA Area:     11.70 cm LA Vol (A2C):   51.3 ml 27.93 ml/m  RA Volume:   22.40 ml  12.20 ml/m LA Vol (A4C):   39.5 ml 21.51 ml/m LA Biplane Vol: 48.7 ml 26.52 ml/m  AORTIC VALVE AV Area (Vmax):    2.08 cm AV Area (Vmean):   2.11 cm AV Area (VTI):     2.11 cm AV Vmax:           131.00 cm/s AV Vmean:          89.800 cm/s AV VTI:            0.303 m AV Peak Grad:      6.9 mmHg AV Mean Grad:      4.0 mmHg LVOT Vmax:         107.00 cm/s LVOT Vmean:        74.400 cm/s LVOT VTI:          0.251 m LVOT/AV VTI ratio: 0.83  AORTA Ao Root diam: 3.10 cm MITRAL VALVE MV Area (PHT): 2.76 cm     SHUNTS MV Decel Time: 275 msec     Systemic VTI:  0.25 m MV E velocity: 78.60 cm/s   Systemic Diam: 1.80 cm MV A velocity: 124.00 cm/s MV E/A ratio:  0.63 Cherlynn Kaiser MD Electronically signed by Cherlynn Kaiser MD Signature Date/Time: 06/15/2021/1:25:52 PM    Final     Microbiology: No results found for this or any previous visit (from the past 240 hour(s)).   Labs: Basic Metabolic Panel: Recent Labs  Lab 06/14/21 1750 06/15/21 0256 06/15/21 0638 06/16/21 0222  NA 137 138  --  138  K 3.8 3.3*  --  4.5  CL 108 108  --  109  CO2 21* 22  --  24  GLUCOSE 87 85  --  91  BUN 16 18  --  16  CREATININE 0.62 0.62  --  0.72  CALCIUM 9.4 8.9  --  8.7*  MG  --   --  1.6* 2.3  PHOS  --   --  3.9 3.9   Liver Function Tests: Recent Labs  Lab 06/16/21 0222  AST 17  ALT 16  ALKPHOS 101  BILITOT 0.4  PROT 5.6*  ALBUMIN 3.4*   No results for input(s): LIPASE,  AMYLASE in the last 168 hours. No results for input(s): AMMONIA  in the last 168 hours. CBC: Recent Labs  Lab 06/14/21 1750 06/15/21 0256 06/16/21 0222  WBC 7.5 5.3 4.7  NEUTROABS 5.7  --  2.8  HGB 13.2 12.0 12.3  HCT 39.7 36.3 38.4  MCV 85.7 86.6 87.7  PLT 223 175 190   Cardiac Enzymes: No results for input(s): CKTOTAL, CKMB, CKMBINDEX, TROPONINI in the last 168 hours. BNP: BNP (last 3 results) No results for input(s): BNP in the last 8760 hours.  ProBNP (last 3 results) No results for input(s): PROBNP in the last 8760 hours.  CBG: No results for input(s): GLUCAP in the last 168 hours.     Signed:  Dia Crawford, MD [facility]

## 2021-06-16 NOTE — Progress Notes (Signed)
Patient agreed to take coreg and hydralazine after talking with Dr. Sherral Hammers.

## 2021-06-16 NOTE — TOC Progression Note (Signed)
Transition of Care [facility]) - Progression Note    Patient Details  Name: Dawn [name] MRN: [id number] Date of Birth: Mar 21, 1946  Transition of Care [facility] CM/SW [place], RN Phone Number: 06/16/2021, 11:06 AM  Clinical Narrative:     75 year old presented with chest pain, has a history of MS. Controlling hypertension is the main focus. May go home today pending blood pressure, started on new medications.     Barriers to Discharge: No Barriers Identified  Expected Discharge Plan and Services    Home                                             Social Determinants of Health (SDOH) Interventions    Readmission Risk Interventions No flowsheet data found.

## 2021-06-17 ENCOUNTER — Encounter: Payer: Self-pay | Admitting: Orthopedic Surgery

## 2021-06-17 ENCOUNTER — Telehealth: Payer: Self-pay

## 2021-06-17 ENCOUNTER — Telehealth (INDEPENDENT_AMBULATORY_CARE_PROVIDER_SITE_OTHER): Payer: Medicare Other | Admitting: Orthopedic Surgery

## 2021-06-17 ENCOUNTER — Other Ambulatory Visit: Payer: Self-pay

## 2021-06-17 ENCOUNTER — Ambulatory Visit (HOSPITAL_BASED_OUTPATIENT_CLINIC_OR_DEPARTMENT_OTHER): Payer: Medicare Other | Admitting: Physical Therapy

## 2021-06-17 DIAGNOSIS — I700 Atherosclerosis of aorta: Secondary | ICD-10-CM

## 2021-06-17 DIAGNOSIS — E782 Mixed hyperlipidemia: Secondary | ICD-10-CM

## 2021-06-17 DIAGNOSIS — G8929 Other chronic pain: Secondary | ICD-10-CM

## 2021-06-17 DIAGNOSIS — I10 Essential (primary) hypertension: Secondary | ICD-10-CM

## 2021-06-17 DIAGNOSIS — Z8673 Personal history of transient ischemic attack (TIA), and cerebral infarction without residual deficits: Secondary | ICD-10-CM

## 2021-06-17 DIAGNOSIS — G35 Multiple sclerosis: Secondary | ICD-10-CM | POA: Diagnosis not present

## 2021-06-17 DIAGNOSIS — I5031 Acute diastolic (congestive) heart failure: Secondary | ICD-10-CM | POA: Diagnosis not present

## 2021-06-17 DIAGNOSIS — M545 Low back pain, unspecified: Secondary | ICD-10-CM

## 2021-06-17 DIAGNOSIS — R079 Chest pain, unspecified: Secondary | ICD-10-CM | POA: Diagnosis not present

## 2021-06-17 LAB — HEMOGLOBIN A1C
Hgb A1c MFr Bld: 5.4 % (ref 4.8–5.6)
Mean Plasma Glucose: 108 mg/dL

## 2021-06-17 NOTE — Telephone Encounter (Signed)
  Transition Care Management Unsuccessful Follow-up Telephone Call  Date of discharge and from where:  06/16/21  Attempts:  1st Attempt  Reason for unsuccessful TCM follow-up call:  No answer/busy

## 2021-06-17 NOTE — Telephone Encounter (Signed)
Transition Care Management Follow-up Telephone Call Date of discharge and from where: [facility]/16/21, [facility] How have you been since you were released from the hospital? No Any questions or concerns? No  Items Reviewed: Did the pt receive and understand the discharge instructions provided? Yes , by the nurse Medications obtained and verified? Yes Other? No  Any new allergies since your discharge? No  Dietary orders reviewed? Yes, heart healthy diet Do you have support at home? Yes   Home Care and Equipment/Supplies: Were home health services ordered? No If so, what is the name of the agency? n/a Has the agency set up a time to come to the patient's home? Not applicable Were any new equipment or medical supplies ordered?  No What is the name of the medical supply agency? N/A Were you able to get the supplies/equipment? Not applicable Do you have any questions related to the use of the equipment or supplies? No  Functional Questionnaire: (I = Independent and D = Dependent) ADLs: I  Bathing/Dressing- I  Meal Prep- I  Eating- I  Maintaining continence- I  Transferring/Ambulation- I  Managing Meds- I  Follow up appointments reviewed:  PCP Hospital f/u appt confirmed? Yes  Scheduled to see [name], NP on 06/17/21 @ 3:45 pm. [facility] f/u appt confirmed? Yes , would like to discuss with Amy prior to scheduling . Are transportation arrangements needed? No  If their condition worsens, is the pt aware to call PCP or go to the Emergency Dept.? Yes Was the patient provided with contact information for the PCP's office or ED? Yes Was to pt encouraged to call back with questions or concerns? Yes

## 2021-06-17 NOTE — Progress Notes (Signed)
Careteam: [name], [name], NP as PCP - General (Family Medicine) [name], MD as Consulting Physician (Orthopedic Surgery) [name], [name], MD (Neurology) [name], [name], [place] (Optometry) [name], NP as Nurse Practitioner (Obstetrics and Gynecology) [name], MD as Consulting Physician (Dermatology)  Seen by: [name], [name]  PLACE OF SERVICE:  [facility] Directive information Does Patient Have a Medical Advance Directive?: Yes, Type of Advance Directive: Living will, Does patient want to make changes to medical advance directive?: No - Patient declined  Allergies  Allergen Reactions   Statins Other (See Comments)    Muscle pain    Chief Complaint  Patient presents with   Acute Visit    Patient wants to discuss new diagnosis "Diastolic Heart Failure".      HPI: Patient is a 75 y.o. female seen today via virtual visit for transition of care s/p hospitalization 11/25-11/27.   11/25 she presented to the ED with sob and chest discomfort. Initial troponin 322 x 3. CXR unremarkable. Echo revealed EF 60-65%. She was diagnosed with acute diastolic heart failure. Cardiology consulted, Coreg increased to 6.25 mg bid, hydralazine and losartan started. She was placed on telemetry for a brief period of time due to atypical chest pain, nonspecific T waved noted on lead III. 11/27 she was discharged home. Advised to schedule appointment  with [facility] within 1-2 weeks.   Today, she reports feeling tired. Denies sob or chest pain.She is taking Coreg, hydralazine and losartan as prescribed. Scale is broken. Friend is bring her a new on later on today. Following low sodium diet. Checking blood pressure at home. This morning SBP was 122, this afternoon pressure was 145/82. She plans to call [place] within the next day to schedule. We discussed importance of following up with cardiology.   Review of Systems:  Review of Systems   Constitutional:  Positive for malaise/fatigue. Negative for chills, fever and weight loss.  HENT:  Negative for congestion and sore throat.   Respiratory:  Negative for cough, shortness of breath and wheezing.   Cardiovascular:  Negative for chest pain, orthopnea and leg swelling.  Gastrointestinal: Negative.   Genitourinary: Negative.   Musculoskeletal:  Positive for joint pain and myalgias. Negative for falls.  Neurological:  Negative for dizziness, weakness and headaches.  Psychiatric/Behavioral:  Negative for depression. The patient is not nervous/anxious.    Past Medical History:  Diagnosis Date   Allergy    Dry eyes    Endometrial polyp    Essential hypertension 08/25/2018   GERD (gastroesophageal reflux disease)    History of hiatal hernia    History of kidney stones    Hot flashes, menopausal 10/13/2011   Estradiol started    Jaundice as teenager   no problems since   Memory loss 09/21/2019     1/2 of feet numb all the time   Multiple sclerosis ([place]) dx 2001   Neuropathy    Neuropathy    bilateral feet   Osteoarthritis    Sjogren's syndrome ([place]) dx oct 2021   sore muscvles, dry mouth and eyes   Stroke [facility])    Vertigo    Vision abnormalities    Past Surgical History:  Procedure Laterality Date   ABDOMINAL HYSTERECTOMY  2002   partial   colonscopy  2011   DILATATION & CURETTAGE/HYSTEROSCOPY WITH MYOSURE N/A 06/08/2020   Procedure: DILATATION & CURETTAGE/HYSTEROSCOPY/Polypectomy WITH MYOSURE;  Surgeon: Langley Gauss A, DO;  Location: [facility];  Service: Gynecology;  Laterality: N/A;   LUMBAR FUSION  2001   TOTAL HIP ARTHROPLASTY Right 01/17/2021   Procedure: TOTAL HIP ARTHROPLASTY ANTERIOR APPROACH;  Surgeon: Rod Can, MD;  Location: [facility];  Service: Orthopedics;  Laterality: Right;   UPPER GI ENDOSCOPY  yrs ago   Social History:   reports that she quit smoking about 37 years ago. Her smoking use included cigarettes. She has a 7.50  pack-year smoking history. She has never used smokeless tobacco. She reports current alcohol use. She reports that she does not use drugs.  Family History  Problem Relation Age of Onset   Heart failure Mother    Heart failure Father    Arthritis Other    Hypertension Other     Medications: Patient's Medications  New Prescriptions   No medications on file  Previous Medications   ASPIRIN EC 81 MG TABLET    Take 81 mg by mouth daily. Swallow whole.   CALCIUM-VITAMIN D PO    Take 1 tablet by mouth in the morning.   CARVEDILOL (COREG) 6.25 MG TABLET    Take 1 tablet (6.25 mg total) by mouth 2 (two) times daily with a meal.   CLONAZEPAM (KLONOPIN) 0.5 MG TABLET    TAKE 1 TABLET(0.5 MG) BY MOUTH AT BEDTIME   FEXOFENADINE (ALLEGRA) 180 MG TABLET    Take 180 mg by mouth at bedtime.    FLAXSEED, LINSEED, (FLAXSEED OIL PO)    Take 5-10 mLs by mouth 3 (three) times a week.   HYDRALAZINE (APRESOLINE) 10 MG TABLET    Take 1 tablet (10 mg total) by mouth every 8 (eight) hours.   LEFLUNOMIDE (ARAVA) 20 MG TABLET    Take 1 tablet (20 mg total) by mouth daily.   LIDOCAINE (LIDODERM) 5 %    Place 1 patch onto the skin daily as needed (mild pain). Remove & Discard patch within 12 hours or as directed by MD   LOSARTAN (COZAAR) 100 MG TABLET    Take 1 tablet (100 mg total) by mouth daily.   MODAFINIL (PROVIGIL) 200 MG TABLET    Take 1 tablet (200 mg total) by mouth daily.   NITROGLYCERIN (NITROSTAT) 0.4 MG SL TABLET    Place 1 tablet (0.4 mg total) under the tongue every 5 (five) minutes as needed for chest pain.   OMEGA-3 300 MG CAPS    Take 300 mg by mouth in the morning.   OMEPRAZOLE (PRILOSEC) 40 MG CAPSULE    TAKE 1 CAPSULE(40 MG) BY MOUTH DAILY   SOLIFENACIN (VESICARE) 5 MG TABLET    Take 1 tablet (5 mg total) by mouth daily.  Modified Medications   No medications on file  Discontinued Medications   No medications on file    Physical Exam:  There were no vitals filed for this visit. There is  no height or weight on file to calculate BMI. Wt Readings from Last 3 Encounters:  06/14/21 182 lb (82.6 kg)  05/14/21 182 lb (82.6 kg)  04/19/21 182 lb (82.6 kg)    Physical Exam- unable to perform due to virtual visit  Labs reviewed: Basic Metabolic Panel: Recent Labs    11/19/20 0945 11/29/20 1341 06/14/21 1750 06/15/21 0256 06/15/21 0638 06/16/21 0222  NA 141   < > 137 138  --  138  K 4.4   < > 3.8 3.3*  --  4.5  CL 104   < > 108 108  --  109  CO2  26   < > 21* 22  --  24  GLUCOSE 85   < > 87 85  --  91  BUN 17   < > 16 18  --  16  CREATININE 0.67   < > 0.62 0.62  --  0.72  CALCIUM 9.8   < > 9.4 8.9  --  8.7*  MG  --   --   --   --  1.6* 2.3  PHOS  --   --   --   --  3.9 3.9  TSH 4.46  --   --   --  4.439  --    < > = values in this interval not displayed.   Liver Function Tests: Recent Labs    10/02/20 1607 11/19/20 0945 01/07/21 1122 01/25/21 1527 06/16/21 0222  AST 28   < > 21 32 17  ALT 69*   < > 20 50* 16  ALKPHOS 161*  --  113  --  101  BILITOT <0.2   < > 0.5 0.4 0.4  PROT 6.1   < > 6.7 5.7* 5.6*  ALBUMIN 4.2  --  4.5  --  3.4*   < > = values in this interval not displayed.   No results for input(s): LIPASE, AMYLASE in the last 8760 hours. No results for input(s): AMMONIA in the last 8760 hours. CBC: Recent Labs    01/25/21 1527 06/14/21 1750 06/15/21 0256 06/16/21 0222  WBC 6.9 7.5 5.3 4.7  NEUTROABS 5,258 5.7  --  2.8  HGB 11.4* 13.2 12.0 12.3  HCT 35.7 39.7 36.3 38.4  MCV 89.7 85.7 86.6 87.7  PLT 303 223 175 190   Lipid Panel: Recent Labs    11/19/20 0945 04/16/21 0920 06/15/21 0256  CHOL 257* 211* 213*  HDL 44* 48* 44  LDLCALC 174* 129* 142*  TRIG 231* 196* 133  CHOLHDL 5.8* 4.4 4.8   TSH: Recent Labs    11/19/20 0945 06/15/21 0638  TSH 4.46 4.439   A1C: Lab Results  Component Value Date   HGBA1C 5.4 06/15/2021     Assessment/Plan 1. Acute diastolic CHF (congestive heart failure) ([place]) - new onset grade 1  11/25 - EF 60-65% - cont Coreg, hydralazine and losartan - cont daily weights - cont low sodium diet - discussed importance of scheduling with cardiology ASAP  2. Essential hypertension - taking pressures at home- reports 145/82 - asymptomatic - BUN/creat 16/0.72 06/16/2021 - cont Coreg, hydralazine and losartan  3. MULTIPLE SCLEROSIS, PROGRESSIVE/RELAPSING - followed by Dr. Felecia Shelling - no recent flares - cont leflunomide,modafinil and clonazepam  4. Mixed hyperlipidemia - LDL 142 06/15/2021 - reports intolerance to statins  5. Aortic atherosclerosis ([place]) - noted on recent CXR - cont aspirin  6. Chest pain, unspecified type - resolved - cont nitroglycerin prn  7. Chronic bilateral low back pain without sciatica - cont lidocaine patch prn  8. History of CVA (cerebrovascular accident) - cont aspirin  Virtual Visit   I connected with Dawn [name] by virtual visit and verified speaking with the correct person using two identifiers.   Patient: Dawn [name] Patient location: home Provider: [name] NP Provider location: [facility]    I discussed the limitations, risks, security and privacy concerns of performing an evaluation and management service by telephone and the availability of in person appointments. I also discussed with the patient that there may be a patient responsible charge related to this service. The patient expressed understanding  and agreed to proceed.    I discussed the assessment and treatment plan with the patient. The patient was provided an opportunity to ask questions and all were answered. The patient agreed with the plan and demonstrated an understanding of the instructions.     The patient was advised to call back or seek an in-person evaluation if the symptoms worsen or if the condition fails to improve as anticipated.   I provided 24 minutes of non-face-to-face time during this encounter.   [name] NP Avs printed and mailed     Next appt: none Yosef Krogh [place], [facility] [phone]

## 2021-06-17 NOTE — Progress Notes (Signed)
hgbA1C is within normal.

## 2021-06-17 NOTE — Progress Notes (Signed)
  This service is provided via telemedicine  No vital signs collected/recorded due to the encounter was a telemedicine visit.   Location of patient (ex: home, work):  Home.  Patient consents to a telephone visit:  Yes  Location of the provider (ex: office, home):  [facility].  Name of any referring provider:  [name], Dinah C, NP   Names of all persons participating in the telemedicine service and their role in the encounter:  Patient, Mishaal Lansdale, RMA, Amy [place], NP.    Time spent on call:  8 minutes spent on the phone with Medical Assistant.    

## 2021-06-18 DIAGNOSIS — M47816 Spondylosis without myelopathy or radiculopathy, lumbar region: Secondary | ICD-10-CM | POA: Diagnosis not present

## 2021-06-18 NOTE — Patient Instructions (Signed)
Please schedule appointment with [facility]

## 2021-06-19 ENCOUNTER — Telehealth: Payer: Self-pay | Admitting: Neurology

## 2021-06-19 NOTE — Telephone Encounter (Signed)
Called the patient back.  Advised that Dr. Felecia Shelling would recommend the patient reach out to them of our heart care group with [facility] health.  He recommends the location at [facility].  Patient verbalized understanding and will reach out to the group.

## 2021-06-19 NOTE — Telephone Encounter (Signed)
Pt called wanting to know if a Cardiologist that specializes in Heart Failure can be recommended to her. Please advise.

## 2021-06-27 ENCOUNTER — Encounter (HOSPITAL_BASED_OUTPATIENT_CLINIC_OR_DEPARTMENT_OTHER): Payer: Self-pay | Admitting: Physical Therapy

## 2021-06-27 ENCOUNTER — Other Ambulatory Visit: Payer: Self-pay

## 2021-06-27 ENCOUNTER — Ambulatory Visit (HOSPITAL_BASED_OUTPATIENT_CLINIC_OR_DEPARTMENT_OTHER): Payer: Medicare Other | Attending: Neurology | Admitting: Physical Therapy

## 2021-06-27 DIAGNOSIS — M545 Low back pain, unspecified: Secondary | ICD-10-CM | POA: Insufficient documentation

## 2021-06-27 DIAGNOSIS — M6281 Muscle weakness (generalized): Secondary | ICD-10-CM | POA: Diagnosis not present

## 2021-06-27 DIAGNOSIS — R252 Cramp and spasm: Secondary | ICD-10-CM | POA: Diagnosis not present

## 2021-06-27 DIAGNOSIS — R262 Difficulty in walking, not elsewhere classified: Secondary | ICD-10-CM | POA: Diagnosis not present

## 2021-06-27 DIAGNOSIS — M542 Cervicalgia: Secondary | ICD-10-CM | POA: Diagnosis not present

## 2021-06-27 DIAGNOSIS — G8929 Other chronic pain: Secondary | ICD-10-CM | POA: Diagnosis not present

## 2021-06-27 NOTE — Therapy (Signed)
[facility] [address] [place], [place], [address] Phone: [phone]   Fax:  [phone]  Physical Therapy Treatment  Patient Details  Name: Dawn [name] MRN: [id number] Date of Birth: [birth year] Referring Provider (PT): Dr Arlice Colt   Encounter Date: 06/27/2021   PT End of Session - 06/27/21 0944     Visit Number 14    Number of Visits 18    Date for PT Re-Evaluation 07/23/21    Authorization Type progress note on visit 20th    PT Start Time 0930    PT Stop Time 1000    PT Time Calculation (min) 30 min    Activity Tolerance Patient tolerated treatment well    Behavior During Therapy [facility] for tasks assessed/performed             Past Medical History:  Diagnosis Date   Allergy    Dry eyes    Endometrial polyp    Essential hypertension 08/25/2018   GERD (gastroesophageal reflux disease)    History of hiatal hernia    History of kidney stones    Hot flashes, menopausal 10/13/2011   Estradiol started    Jaundice as teenager   no problems since   Memory loss 09/21/2019     1/2 of feet numb all the time   Multiple sclerosis ([place]) dx 2001   Neuropathy    Neuropathy    bilateral feet   Osteoarthritis    Sjogren's syndrome ([place]) dx oct 2021   sore muscvles, dry mouth and eyes   Stroke [facility])    Vertigo    Vision abnormalities     Past Surgical History:  Procedure Laterality Date   ABDOMINAL HYSTERECTOMY  2002   partial   colonscopy  2011   DILATATION & CURETTAGE/HYSTEROSCOPY WITH MYOSURE N/A 06/08/2020   Procedure: DILATATION & CURETTAGE/HYSTEROSCOPY/Polypectomy WITH MYOSURE;  Surgeon: Armandina Stammer, DO;  Location: [facility];  Service: Gynecology;  Laterality: N/A;   LUMBAR FUSION  2001   TOTAL HIP ARTHROPLASTY Right 01/17/2021   Procedure: TOTAL HIP ARTHROPLASTY ANTERIOR APPROACH;  Surgeon: Rod Can, MD;  Location[facility];  Service: Orthopedics;  Laterality: Right;    UPPER GI ENDOSCOPY  yrs ago    There were no vitals filed for this visit.   Subjective Assessment - 06/27/21 0937     Subjective Patient had a NSTEMI on 06/14/2021. She was in the hospital for 2 days. She has talked to a heart specialist but has not been cleared for therapy. The patient has been sleeping sitting up. She feels like her back and hips have been very painful. She has been very syncopal lying down.    Pertinent History MS    How long can you stand comfortably? hurts as soon as she stands. Has to shift frequently    How long can you walk comfortably? limited community ambualtion    Diagnostic tests Nothing recent    Patient Stated Goals to go home    Currently in Pain? Yes    Pain Score 3     Pain Location Hip    Pain Orientation Right;Left    Pain Descriptors / Indicators Aching    Pain Type Chronic pain    Pain Onset More than a month ago    Pain Frequency Constant    Aggravating Factors  standing and walking    Pain Relieving Factors light exercises    Multiple Pain Sites No  Manual: trigger point release to paraspinals and gluteals in sidleying  LAD to left leg in supine.                            PT Education - 06/27/21 1329     Education Details reviewed the fact she needs to be cleared with her heart MD for therapy    Person(s) Educated Patient    Methods Explanation;Demonstration;Tactile cues;Verbal cues    Comprehension Verbalized understanding;Returned demonstration;Verbal cues required;Tactile cues required              PT Short Term Goals - 03/14/21 1352       PT SHORT TERM GOAL #1   Title Patient will report a 50% reduction in lower back pain    Time 4    Period Weeks    Status New    Target Date 01/17/21      PT SHORT TERM GOAL #2   Title Patient will increase distance on 6 min walk test by 200'    Time 4    Period Weeks    Status New    Target Date 04/11/21      PT SHORT TERM GOAL #3    Title Patient increase right hip flexion strength to 4+/5    Time 4    Period Weeks    Status New    Target Date 04/11/21               PT Long Term Goals - 03/14/21 1356       PT LONG TERM GOAL #1   Title Patient will return to [facility] with a full program for her hip    Time 12    Period Weeks    Status New    Target Date 06/06/21      PT LONG TERM GOAL #2   Title Patient will ambualte 1500' on the 6 min walk test in order to improve community ambualtion    Time 12    Period Weeks    Status New    Target Date 06/06/21      PT LONG TERM GOAL #3   Title Patient will stand for 30 min without a significant increase in pain in order to perfrom ADL's    Time 12    Period Weeks    Status New    Target Date 06/06/21                   Plan - 06/27/21 1329     Clinical Impression Statement Patient reports her back is sore because of how she has to sleep to keep from getting syncopal. Her B/P was measured at 141/90. Therapy performed light manual therapy on he back, but no exercises were performed. She was advised before she comes back she will need clearance from her cardiologist. She will see them next Friday.   Personal Factors and Comorbidities Comorbidity 1;Comorbidity 2    Comorbidities MS, OA    Examination-Activity Limitations Bed Mobility    Examination-Participation Restrictions Meal Prep;Cleaning;Community Activity;Laundry;Shop    Stability/Clinical Decision Making Stable/Uncomplicated    Clinical Decision Making Low    Rehab Potential Good    PT Frequency 1x / week    PT Duration 8 weeks    PT Treatment/Interventions ADLs/Self Care Home Management;Electrical Stimulation;Cryotherapy;Iontophoresis 4mg /ml Dexamethasone;Moist Heat;Traction;DME Instruction;Neuromuscular re-education;Patient/family education;Manual techniques;Passive range of motion;Taping;Therapeutic activities;Therapeutic exercise;Balance training;Gait training;Stair training;Functional  mobility training;Dry needling;Ultrasound    PT  Next Visit Plan continue with pool and land strengthening, manual therapy to back    PT Home Exercise Plan reviewed seated hamstring stretch and LTR. She has done those in the past.    Consulted and Agree with Plan of Care Patient             Patient will benefit from skilled therapeutic intervention in order to improve the following deficits and impairments:  Abnormal gait, Difficulty walking, Decreased range of motion, Decreased mobility, Decreased strength, Postural dysfunction, Pain, Increased muscle spasms  Visit Diagnosis: Chronic bilateral low back pain without sciatica  Difficulty in walking, not elsewhere classified  Muscle weakness (generalized)  Cramp and spasm  Cervicalgia     Problem List Patient Active Problem List   Diagnosis Date Noted   Acute diastolic CHF (congestive heart failure) ([place]) 06/15/2021   Demand ischemia ([place]) 06/15/2021   Chest pain 06/14/2021   Elevated troponin 06/14/2021   Statin intolerance 04/19/2021   Osteoarthritis of right hip 01/17/2021   Status post THR (total hip replacement) 01/17/2021   Chronic bilateral low back pain without sciatica 10/02/2020   Chronic pain syndrome 06/20/2020   Post laminectomy syndrome 06/20/2020   Sjogren's disease ([place]) 05/23/2020   Primary osteoarthritis of both hands 05/23/2020   Primary osteoarthritis of both feet 05/23/2020   Sacroiliitis, not elsewhere classified ([place]) 12/22/2019   Other secondary scoliosis, lumbar region 12/22/2019   Spondylosis without myelopathy or radiculopathy, lumbar region 12/22/2019   Cervical radiculopathy 10/18/2019   Numbness 09/21/2019   Bilateral carpal tunnel syndrome 09/21/2019   High risk medication use 09/21/2019   Memory loss 09/21/2019   Essential hypertension 08/25/2018   Abnormal SPEP 02/19/2018   Hand pain 02/20/2017   Multiple joint pain 02/18/2017   Right elbow pain 12/09/2016   Disturbed cognition  06/25/2016   Trochanteric bursitis of left hip 02/18/2016   Sciatica, right side 10/30/2015   Bilateral arm pain 10/10/2015   Trochanteric bursitis of both hips 08/04/2014   Chronic fatigue 08/04/2014   Urinary frequency 08/04/2014   Abnormality of gait 08/04/2014   Unspecified visual disturbance 01/31/2013   Transient vision disturbance 01/31/2013   Routine general medical examination at a health care facility 11/04/2011   Colon polyps 11/03/2011   Hot flashes, menopausal 10/13/2011   POSTHERPETIC NEURALGIA 10/03/2009   DISPLCMT LUMBAR INTERVERT DISC W/O MYELOPATHY 09/05/2009   RENAL CALCULUS, RECURRENT 09/04/2009   Hyperlipidemia 08/31/2009   MULTIPLE SCLEROSIS, PROGRESSIVE/RELAPSING 08/31/2009   ALLERGIC RHINITIS 08/31/2009    Carney Living, PT 06/27/2021, 1:35 PM  [facility] [address] [place], [place], [address] Phone: [phone]   Fax:  [phone]  Name: Dawn [name] MRN: [id number] Date of Birth: May 09, 1946

## 2021-06-28 ENCOUNTER — Other Ambulatory Visit: Payer: Self-pay | Admitting: Neurology

## 2021-07-04 ENCOUNTER — Encounter (HOSPITAL_BASED_OUTPATIENT_CLINIC_OR_DEPARTMENT_OTHER): Payer: Medicare Other | Admitting: Physical Therapy

## 2021-07-04 NOTE — Progress Notes (Signed)
°Cardiology Office Note:   ° °Date:  07/05/2021  ° °ID:  Dawn [name], DOB 09/07/1945, MRN [id number] ° °PCP:  [name], Dinah C, NP °  °[facility] Providers °Cardiologist:  Eleonora Peeler E, MD    ° °Referring MD: [name], Dinah C, NP  ° °No chief complaint on file. °Hospital Follow Up ° °History of Present Illness:   ° °Dawn [name] is a 75 y.o. female with a hx  below, CVA on aspirin, HLD,  MS, hx referral for hospitalization with NSTEMI, hypertensive emergency ° °She got tight around her chest. She was short of breath. She had arm cramping. Her symptoms were abrupt. It was later in the day and she was doing activity. She has MS and felt an "MS hug" which can be a tight feeling in her chest. Prior to that she she had no orthopnea, PND, or LE edema.  She reported her blood pressure was high and it was SBP 210 when she checked it. She went to urgent care. She went to the ED. In the ED she reported above. She had EKG with no ischemic changes. Her troponin was below. She had systolic Bps 160s-170s.  She and an echo with normal LV function, mild LVH, no valve dx. RA pressure of 3. Don't see a BNP. Her chest xray was unremarkable. She was hypertensive and her coreg was increased and hydralazine was added. Admission sounds like hypertensive emergency. She was thought to have diastolic CHF with "new onset grade I diastolic dysfunction". ° °06/15/2021 Trop 182-->322->281->365->387->322 ° °EKG 06/18/2021-NSR  ° °Today, she feels light headed and tired. She hasn't had pain. When she does activity she feels palpitations. No chest pressure with activity. No shortness of breath with activity. No hospital admission for heart failure in the past. She smoked for 11 years and quit in 1987. No stress test and no LHC.  Her father died of MI at 42. Her brother died of heart attack at 42. Sister and grandmother had rheumatoid arthritis. She was diagnosed with MS in 2001 and it is progressive. ° °She was on zetia but stopped  b/c of muscle aches. She was on a statin years ago. She had full body cramping. ° ° °06/15/2021 °LDL 142 °HDL 44 °TC 213 °A1c 5.4 °Crt 0.7 °TSH 4.4  ° ° °Wt Readings from Last 3 Encounters:  °07/05/21 178 lb (80.7 kg)  °06/14/21 182 lb (82.6 kg)  °05/14/21 182 lb (82.6 kg)  ° ° °Cardiology Studies °TTE 06/15/2021 °1. Left ventricular ejection fraction, by estimation, is 60 to 65%. The  °left ventricle has normal function. The left ventricle has no regional  °wall motion abnormalities. There is mild left ventricular hypertrophy.  °Left ventricular diastolic parameters  °are consistent with Grade I diastolic dysfunction (impaired relaxation).  ° 2. Right ventricular systolic function is normal. The right ventricular  °size is normal. Tricuspid regurgitation signal is inadequate for assessing  °PA pressure.  ° 3. The mitral valve is grossly normal. Mild mitral valve regurgitation.  °No evidence of mitral stenosis.  ° 4. The aortic valve is grossly normal. There is mild calcification of the  °aortic valve. Aortic valve regurgitation is not visualized. No aortic  °stenosis is present.  ° 5. The inferior vena cava is normal in size with greater than 50%  °respiratory variability, suggesting right atrial pressure of 3 mmHg.  ° °Past Medical History:  °Diagnosis Date  ° Allergy   ° Dry eyes   ° Endometrial polyp   °   Essential hypertension 08/25/2018   GERD (gastroesophageal reflux disease)    History of hiatal hernia    History of kidney stones    Hot flashes, menopausal 10/13/2011   Estradiol started    Jaundice as teenager   no problems since   Memory loss 09/21/2019     1/2 of feet numb all the time   Multiple sclerosis ([place]) dx 2001   Neuropathy    Neuropathy    bilateral feet   Osteoarthritis    Sjogren's syndrome ([place]) dx oct 2021   sore muscvles, dry mouth and eyes   Stroke [facility])    Vertigo    Vision abnormalities     Past Surgical History:  Procedure Laterality Date   ABDOMINAL HYSTERECTOMY   2002   partial   colonscopy  2011   DILATATION & CURETTAGE/HYSTEROSCOPY WITH MYOSURE N/A 06/08/2020   Procedure: DILATATION & CURETTAGE/HYSTEROSCOPY/Polypectomy WITH MYOSURE;  Surgeon: Armandina Stammer, DO;  Location: [facility];  Service: Gynecology;  Laterality: N/A;   LUMBAR FUSION  2001   TOTAL HIP ARTHROPLASTY Right 01/17/2021   Procedure: TOTAL HIP ARTHROPLASTY ANTERIOR APPROACH;  Surgeon: Rod Can, MD;  Location: [facility];  Service: Orthopedics;  Laterality: Right;   UPPER GI ENDOSCOPY  yrs ago    Current Medications: Current Meds  Medication Sig   aspirin EC 81 MG tablet Take 81 mg by mouth daily. Swallow whole.   CALCIUM-VITAMIN D PO Take 1 tablet by mouth in the morning.   carvedilol (COREG) 6.25 MG tablet Take 1 tablet (6.25 mg total) by mouth 2 (two) times daily with a meal.   [START ON 07/07/2021] clonazePAM (KLONOPIN) 0.5 MG tablet TAKE 1 TABLET(0.5 MG) BY MOUTH AT BEDTIME   fexofenadine (ALLEGRA) 180 MG tablet Take 180 mg by mouth at bedtime.    Flaxseed, Linseed, (FLAXSEED OIL PO) Take 5-10 mLs by mouth 3 (three) times a week.   hydrALAZINE (APRESOLINE) 10 MG tablet Take 1 tablet (10 mg total) by mouth every 8 (eight) hours.   leflunomide (ARAVA) 20 MG tablet Take 1 tablet (20 mg total) by mouth daily.   lidocaine (LIDODERM) 5 % Place 1 patch onto the skin daily as needed (mild pain). Remove & Discard patch within 12 hours or as directed by MD   losartan (COZAAR) 100 MG tablet Take 1 tablet (100 mg total) by mouth daily.   modafinil (PROVIGIL) 200 MG tablet Take 1 tablet (200 mg total) by mouth daily.   nitroGLYCERIN (NITROSTAT) 0.4 MG SL tablet Place 1 tablet (0.4 mg total) under the tongue every 5 (five) minutes as needed for chest pain.   Omega-3 300 MG CAPS Take 300 mg by mouth in the morning.   omeprazole (PRILOSEC) 40 MG capsule TAKE 1 CAPSULE(40 MG) BY MOUTH DAILY   solifenacin (VESICARE) 5 MG tablet Take 1 tablet (5 mg total) by mouth daily.      Allergies:   Statins   Social History   Socioeconomic History   Marital status: Widowed    Spouse name: Not on file   Number of children: Not on file   Years of education: Not on file   Highest education level: Not on file  Occupational History   Occupation: retired  Tobacco Use   Smoking status: Former    Packs/day: 0.50    Years: 15.00    Pack years: 7.50    Types: Cigarettes    Quit date: 08/05/1983    Years since quitting: 37.9   Smokeless  tobacco: Never  Vaping Use   Vaping Use: Never used  Substance and Sexual Activity   Alcohol use: Yes    Alcohol/week: 0.0 standard drinks    Comment: rarely   Drug use: No   Sexual activity: Not Currently    Birth control/protection: Post-menopausal  Other Topics Concern   Not on file  Social History Narrative   Regular Exercise-no   Widowed   Retired   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     Tobacco use, amount per day now: former   Past tobacco use, amount per day: 1/2-1 packet   How many years did you use tobacco: 12 years   Alcohol use (drinks per week): 0   Diet: mediterranean based   Do you drink/eat things with caffeine: yes   Marital status:        widow                          What year were you married? 1987   Do you live in a house, apartment, assisted living, condo, trailer, etc.? house   Is it one or more stories? 1 story   How many persons live in your home? 1   Do you have pets in your home?( please list) no   Current or past profession: Science writer    Do you exercise?          yes                        Type and how often? Tai chi and pt therapy, stretches etc....   Do you have a living will? yes   Do you have a DNR form?      no                             If not, do you want to discuss one? yes   Do you have signed POA/HPOA forms?       no                  If so, please bring to you appointment   Social Determinants of Health   Financial Resource Strain: Not on file  Food Insecurity: Not on file  Transportation Needs: Not on file  Physical Activity: Not on file  Stress: Not on file  Social Connections: Not on file     Family History: The patient's family history includes Arthritis in an other family member; Heart failure in her father and mother; Hypertension in an other family member.  ROS:   Please see the history of present illness.     All other systems reviewed and are negative.  EKGs/Labs/Other Studies Reviewed:    The following studies were reviewed today:   EKG:  EKG is  ordered today.  The ekg ordered today demonstrates  NSR,  4m TWI laterally  Recent Labs: 06/15/2021: TSH 4.439 06/16/2021: ALT 16; BUN 16; Creatinine, Ser 0.72; Hemoglobin 12.3; Magnesium 2.3; Platelets 190; Potassium 4.5; Sodium 138  Recent Lipid Panel    Component Value Date/Time   CHOL 213 (H) 06/15/2021 0256   TRIG 133 06/15/2021 0256   HDL 44 06/15/2021 0256  CHOLHDL 4.8 06/15/2021 0256   VLDL 27 06/15/2021 0256   LDLCALC 142 (H) 06/15/2021 0256   LDLCALC 129 (H) 04/16/2021 0920   LDLDIRECT 130.2 11/03/2011 0920     Risk Assessment/Calculations:           Physical Exam:    VS:  BP 122/70 (BP Location: Left Arm, Patient Position: Sitting, Cuff Size: Normal)    Pulse 62    Resp 20    Ht 5' 2" (1.575 m)    Wt 178 lb (80.7 kg)    SpO2 97%    BMI 32.56 kg/m     Wt Readings from Last 3 Encounters:  07/05/21 178 lb (80.7 kg)  06/14/21 182 lb (82.6 kg)  05/14/21 182 lb (82.6 kg)     GEN:  Well nourished, well developed in no acute distress HEENT: Normal NECK: No JVD; No carotid bruits LYMPHATICS: No lymphadenopathy CARDIAC: RRR, 3/6 SEM RUSB rubs, gallops RESPIRATORY:  Clear to auscultation without rales, wheezing or rhonchi  ABDOMEN: Soft, non-tender, non-distended MUSCULOSKELETAL:  No edema; No  deformity  SKIN: Warm and dry NEUROLOGIC:  Alert and oriented x 3 PSYCHIATRIC:  Normal affect   ASSESSMENT:   #Hypertensive Emergency vs. NSTEMI: She had hypertensive emergency she had trop change 182 to 300s. With this in addition to history of smoking and CVA , she is intermediate risk and opt to get a gated CTA.  -continue nitro SL -continue BP meds below -continue asa 81 mg daily -not on cholesterol lowering medications, will discuss on follow up visit.  #HTN: well controlled.  Continue coreg 6.25 mg BID, hydralazine 10 mg TID, losartan 100 mg.   PLAN:    In order of problems listed above:  Coronary CTA Follow up 3 months           Medication Adjustments/Labs and Tests Ordered: Current medicines are reviewed at length with the patient today.  Concerns regarding medicines are outlined above.  Orders Placed This Encounter  Procedures   CT CORONARY MORPH W/CTA COR W/SCORE W/CA W/CM &/OR WO/CM   Basic metabolic panel   EKG 45-WUJW   No orders of the defined types were placed in this encounter.   Patient Instructions  Medication Instructions:  Your physician recommends that you continue on your current medications as directed. Please refer to the Current Medication list given to you today.  *If you need a refill on your cardiac medications before your next appointment, please call your pharmacy*  Lab Work: Your physician recommends that you return for lab work 1 week prior to Coronary CTA:  BMET If you have labs (blood work) drawn today and your tests are completely normal, you will receive your results only by: [place] (if you have MyChart) OR A paper copy in the mail If you have any lab test that is abnormal or we need to change your treatment, we will call you to review the results.  Testing/Procedures: Your physician has requested that you have cardiac CT. A cardiac CT angiogram is a procedure to look at the heart and the area around the heart. It may  be done to help find the cause of chest pains or other symptoms of heart disease. During this procedure, a substance called contrast dye is injected into the blood vessels in the area to be checked. A large X-ray machine, called a CT scanner, then takes detailed pictures of the heart and the surrounding area. The procedure is also sometimes called a coronary CT angiogram, coronary  artery scanning, or CTA.   Follow-Up: At [facility], you and your health needs are our priority.  As part of our continuing mission to provide you with exceptional heart care, we have created designated Provider Care Teams.  These Care Teams include your primary Cardiologist (physician) and Advanced Practice Providers (APPs -  Physician Assistants and Nurse Practitioners) who all work together to provide you with the care you need, when you need it.  Your next appointment:   3 month(s)  The format for your next appointment:   In Person  Provider:   Janina Mayo, MD     Other Instructions   Your cardiac CT will be scheduled at one of the below locations:   [facility] [address] [place], [place] [address] [phone]  [place] [address] [place], [place] [address] [phone]  If scheduled at [facility], please arrive at the [facility] main entrance (entrance A) of [facility] 30 minutes prior to test start time. You can use the FREE valet parking offered at the main entrance (encouraged to control the heart rate for the test) Proceed to the [facility] Radiology Department (first floor) to check-in and test prep.  If scheduled at [facility], please arrive 15 mins early for check-in and test prep.  Please follow these instructions carefully (unless otherwise directed):  On the Night Before the Test: Be sure to Drink plenty of water. Do not consume any  caffeinated/decaffeinated beverages or chocolate 12 hours prior to your test. Do not take any antihistamines 12 hours prior to your test. If the patient has contrast allergy: Patient will need a prescription for Prednisone and very clear instructions (as follows): Prednisone 50 mg - take 13 hours prior to test Take another Prednisone 50 mg 7 hours prior to test Take another Prednisone 50 mg 1 hour prior to test Take Benadryl 50 mg 1 hour prior to test Patient must complete all four doses of above prophylactic medications. Patient will need a ride after test due to Benadryl.  On the Day of the Test: Drink plenty of water until 1 hour prior to the test. Do not eat any food 4 hours prior to the test. You may take your regular medications prior to the test.  Take metoprolol (Lopressor) two hours prior to test. HOLD Furosemide/Hydrochlorothiazide morning of the test. FEMALES- please wear underwire-free bra if available, avoid dresses & tight clothing   *For Clinical Staff only. Please instruct patient the following:* Heart Rate Medication Recommendations for Cardiac CT  Resting HR < 50 bpm  No medication  Resting HR 50-60 bpm and BP >110/50 mmHG   Consider Metoprolol tartrate 25 mg PO 90-120 min prior to scan  Resting HR 60-65 bpm and BP >110/50 mmHG  Metoprolol tartrate 50 mg PO 90-120 minutes prior to scan   Resting HR > 65 bpm and BP >110/50 mmHG  Metoprolol tartrate 100 mg PO 90-120 minutes prior to scan  Consider Ivabradine 10-15 mg PO or a calcium channel blocker for resting HR >60 bpm and contraindication to metoprolol tartrate  Consider Ivabradine 10-15 mg PO in combination with metoprolol tartrate for HR >80 bpm         After the Test: Drink plenty of water. After receiving IV contrast, you may experience a mild flushed feeling. This is normal. On occasion, you may experience a mild rash up to 24 hours after the test.  This is not dangerous. If this occurs, you can take  Benadryl 25 mg and increase your fluid intake. °If you experience trouble breathing, this can be serious. If it is severe call 911 IMMEDIATELY. If it is mild, please call our office. °If you take any of these medications: Glipizide/Metformin, Avandament, Glucavance, please do not take 48 hours after completing test unless otherwise instructed. ° °Please allow 2-4 weeks for scheduling of routine cardiac CTs. Some insurance companies require a pre-authorization which may delay scheduling of this test.  ° °For non-scheduling related questions, please contact the cardiac imaging nurse navigator should you have any questions/concerns: °Sara Wallace, Cardiac Imaging Nurse Navigator °Merle Prescott, Cardiac Imaging Nurse Navigator °[facility] °Direct Office Dial: [phone]  ° °For scheduling needs, including cancellations and rescheduling, please call [place], [phone]. ° °  ° °Signed, °Chadric Kimberley E, MD  °07/05/2021 10:40 AM    °[facility] ° °

## 2021-07-04 NOTE — H&P (View-Only) (Signed)
Cardiology Office Note:    Date:  07/05/2021   ID:  Dawn [name], DOB [birth year], MRN [id number]  PCP:  Sandrea [place], NP   [facility] Providers Cardiologist:  Janina Mayo, MD     Referring MD: Sandrea Hug[facility], NP   No chief complaint on file. Hospital Follow Up  History of Present Illness:    Dawn [name] is a 75 y.o. female with a hx  below, CVA on aspirin, HLD,  MS, hx referral for hospitalization with NSTEMI, hypertensive emergency  She got tight around her chest. She was short of breath. She had arm cramping. Her symptoms were abrupt. It was later in the day and she was doing activity. She has MS and felt an "MS hug" which can be a tight feeling in her chest. Prior to that she she had no orthopnea, PND, or LE edema.  She reported her blood pressure was high and it was SBP 210 when she checked it. She went to urgent care. She went to the ED. In the ED she reported above. She had EKG with no ischemic changes. Her troponin was below. She had systolic Bps [id number].  She and an echo with normal LV function, mild LVH, no valve dx. RA pressure of 3. Don't see a BNP. Her chest xray was unremarkable. She was hypertensive and her coreg was increased and hydralazine was added. Admission sounds like hypertensive emergency. She was thought to have diastolic CHF with "new onset grade I diastolic dysfunction".  06/15/2021 Trop 182-->322->281->365->387->322  EKG 06/18/2021-NSR   Today, she feels light headed and tired. She hasn't had pain. When she does activity she feels palpitations. No chest pressure with activity. No shortness of breath with activity. No hospital admission for heart failure in the past. She smoked for 11 years and quit in 1987. No stress test and no LHC.  Her father died of MI at 44. Her brother died of heart attack at 61. Sister and grandmother had rheumatoid arthritis. She was diagnosed with MS in 2001 and it is progressive.  She was on zetia but stopped  b/c of muscle aches. She was on a statin years ago. She had full body cramping.   06/15/2021 LDL 142 HDL 44 TC 213 A1c 5.4 Crt 0.7 TSH 4.4    Wt Readings from Last 3 Encounters:  07/05/21 178 lb (80.7 kg)  06/14/21 182 lb (82.6 kg)  05/14/21 182 lb (82.6 kg)    Cardiology Studies TTE 06/15/2021 1. Left ventricular ejection fraction, by estimation, is 60 to 65%. The  left ventricle has normal function. The left ventricle has no regional  wall motion abnormalities. There is mild left ventricular hypertrophy.  Left ventricular diastolic parameters  are consistent with Grade I diastolic dysfunction (impaired relaxation).   2. Right ventricular systolic function is normal. The right ventricular  size is normal. Tricuspid regurgitation signal is inadequate for assessing  PA pressure.   3. The mitral valve is grossly normal. Mild mitral valve regurgitation.  No evidence of mitral stenosis.   4. The aortic valve is grossly normal. There is mild calcification of the  aortic valve. Aortic valve regurgitation is not visualized. No aortic  stenosis is present.   5. The inferior vena cava is normal in size with greater than 50%  respiratory variability, suggesting right atrial pressure of 3 mmHg.   Past Medical History:  Diagnosis Date   Allergy    Dry eyes    Endometrial polyp  Essential hypertension 08/25/2018   GERD (gastroesophageal reflux disease)    History of hiatal hernia    History of kidney stones    Hot flashes, menopausal 10/13/2011   Estradiol started    Jaundice as teenager   no problems since   Memory loss 09/21/2019     1/2 of feet numb all the time   Multiple sclerosis ([place]) dx 2001   Neuropathy    Neuropathy    bilateral feet   Osteoarthritis    Sjogren's syndrome ([place]) dx oct 2021   sore muscvles, dry mouth and eyes   Stroke [facility])    Vertigo    Vision abnormalities     Past Surgical History:  Procedure Laterality Date   ABDOMINAL HYSTERECTOMY   2002   partial   colonscopy  2011   DILATATION & CURETTAGE/HYSTEROSCOPY WITH MYOSURE N/A 06/08/2020   Procedure: DILATATION & CURETTAGE/HYSTEROSCOPY/Polypectomy WITH MYOSURE;  Surgeon: Armandina Stammer, DO;  Location: [facility];  Service: Gynecology;  Laterality: N/A;   LUMBAR FUSION  2001   TOTAL HIP ARTHROPLASTY Right 01/17/2021   Procedure: TOTAL HIP ARTHROPLASTY ANTERIOR APPROACH;  Surgeon: Rod Can, MD;  Location: [facility];  Service: Orthopedics;  Laterality: Right;   UPPER GI ENDOSCOPY  yrs ago    Current Medications: Current Meds  Medication Sig   aspirin EC 81 MG tablet Take 81 mg by mouth daily. Swallow whole.   CALCIUM-VITAMIN D PO Take 1 tablet by mouth in the morning.   carvedilol (COREG) 6.25 MG tablet Take 1 tablet (6.25 mg total) by mouth 2 (two) times daily with a meal.   [START ON 07/07/2021] clonazePAM (KLONOPIN) 0.5 MG tablet TAKE 1 TABLET(0.5 MG) BY MOUTH AT BEDTIME   fexofenadine (ALLEGRA) 180 MG tablet Take 180 mg by mouth at bedtime.    Flaxseed, Linseed, (FLAXSEED OIL PO) Take 5-10 mLs by mouth 3 (three) times a week.   hydrALAZINE (APRESOLINE) 10 MG tablet Take 1 tablet (10 mg total) by mouth every 8 (eight) hours.   leflunomide (ARAVA) 20 MG tablet Take 1 tablet (20 mg total) by mouth daily.   lidocaine (LIDODERM) 5 % Place 1 patch onto the skin daily as needed (mild pain). Remove & Discard patch within 12 hours or as directed by MD   losartan (COZAAR) 100 MG tablet Take 1 tablet (100 mg total) by mouth daily.   modafinil (PROVIGIL) 200 MG tablet Take 1 tablet (200 mg total) by mouth daily.   nitroGLYCERIN (NITROSTAT) 0.4 MG SL tablet Place 1 tablet (0.4 mg total) under the tongue every 5 (five) minutes as needed for chest pain.   Omega-3 300 MG CAPS Take 300 mg by mouth in the morning.   omeprazole (PRILOSEC) 40 MG capsule TAKE 1 CAPSULE(40 MG) BY MOUTH DAILY   solifenacin (VESICARE) 5 MG tablet Take 1 tablet (5 mg total) by mouth daily.      Allergies:   Statins   Social History   Socioeconomic History   Marital status: Widowed    Spouse name: Not on file   Number of children: Not on file   Years of education: Not on file   Highest education level: Not on file  Occupational History   Occupation: retired  Tobacco Use   Smoking status: Former    Packs/day: 0.50    Years: 15.00    Pack years: 7.50    Types: Cigarettes    Quit date: 08/05/1983    Years since quitting: 37.9   Smokeless  tobacco: Never  Vaping Use   Vaping Use: Never used  Substance and Sexual Activity   Alcohol use: Yes    Alcohol/week: 0.0 standard drinks    Comment: rarely   Drug use: No   Sexual activity: Not Currently    Birth control/protection: Post-menopausal  Other Topics Concern   Not on file  Social History Narrative   Regular Exercise-no   Widowed   Retired   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     Tobacco use, amount per day now: former   Past tobacco use, amount per day: 1/2-1 packet   How many years did you use tobacco: 12 years   Alcohol use (drinks per week): 0   Diet: mediterranean based   Do you drink/eat things with caffeine: yes   Marital status:        widow                          What year were you married? 1987   Do you live in a house, apartment, assisted living, condo, trailer, etc.? house   Is it one or more stories? 1 story   How many persons live in your home? 1   Do you have pets in your home?( please list) no   Current or past profession: Science writer    Do you exercise?          yes                        Type and how often? Tai chi and pt therapy, stretches etc....   Do you have a living will? yes   Do you have a DNR form?      no                             If not, do you want to discuss one? yes   Do you have signed POA/HPOA forms?       no                  If so, please bring to you appointment   Social Determinants of Health   Financial Resource Strain: Not on file  Food Insecurity: Not on file  Transportation Needs: Not on file  Physical Activity: Not on file  Stress: Not on file  Social Connections: Not on file     Family History: The patient's family history includes Arthritis in an other family member; Heart failure in her father and mother; Hypertension in an other family member.  ROS:   Please see the history of present illness.     All other systems reviewed and are negative.  EKGs/Labs/Other Studies Reviewed:    The following studies were reviewed today:   EKG:  EKG is  ordered today.  The ekg ordered today demonstrates  NSR,  4m TWI laterally  Recent Labs: 06/15/2021: TSH 4.439 06/16/2021: ALT 16; BUN 16; Creatinine, Ser 0.72; Hemoglobin 12.3; Magnesium 2.3; Platelets 190; Potassium 4.5; Sodium 138  Recent Lipid Panel    Component Value Date/Time   CHOL 213 (H) 06/15/2021 0256   TRIG 133 06/15/2021 0256   HDL 44 06/15/2021 0256  CHOLHDL 4.8 06/15/2021 0256   VLDL 27 06/15/2021 0256   LDLCALC 142 (H) 06/15/2021 0256   LDLCALC 129 (H) 04/16/2021 0920   LDLDIRECT 130.2 11/03/2011 0920     Risk Assessment/Calculations:           Physical Exam:    VS:  BP 122/70 (BP Location: Left Arm, Patient Position: Sitting, Cuff Size: Normal)    Pulse 62    Resp 20    Ht 5' 2" (1.575 m)    Wt 178 lb (80.7 kg)    SpO2 97%    BMI 32.56 kg/m     Wt Readings from Last 3 Encounters:  07/05/21 178 lb (80.7 kg)  06/14/21 182 lb (82.6 kg)  05/14/21 182 lb (82.6 kg)     GEN:  Well nourished, well developed in no acute distress HEENT: Normal NECK: No JVD; No carotid bruits LYMPHATICS: No lymphadenopathy CARDIAC: RRR, 3/6 SEM RUSB rubs, gallops RESPIRATORY:  Clear to auscultation without rales, wheezing or rhonchi  ABDOMEN: Soft, non-tender, non-distended MUSCULOSKELETAL:  No edema; No  deformity  SKIN: Warm and dry NEUROLOGIC:  Alert and oriented x 3 PSYCHIATRIC:  Normal affect   ASSESSMENT:   #Hypertensive Emergency vs. NSTEMI: She had hypertensive emergency she had trop change 182 to 300s. With this in addition to history of smoking and CVA , she is intermediate risk and opt to get a gated CTA.  -continue nitro SL -continue BP meds below -continue asa 81 mg daily -not on cholesterol lowering medications, will discuss on follow up visit.  #HTN: well controlled.  Continue coreg 6.25 mg BID, hydralazine 10 mg TID, losartan 100 mg.   PLAN:    In order of problems listed above:  Coronary CTA Follow up 3 months           Medication Adjustments/Labs and Tests Ordered: Current medicines are reviewed at length with the patient today.  Concerns regarding medicines are outlined above.  Orders Placed This Encounter  Procedures   CT CORONARY MORPH W/CTA COR W/SCORE W/CA W/CM &/OR WO/CM   Basic metabolic panel   EKG 45-WUJW   No orders of the defined types were placed in this encounter.   Patient Instructions  Medication Instructions:  Your physician recommends that you continue on your current medications as directed. Please refer to the Current Medication list given to you today.  *If you need a refill on your cardiac medications before your next appointment, please call your pharmacy*  Lab Work: Your physician recommends that you return for lab work 1 week prior to Coronary CTA:  BMET If you have labs (blood work) drawn today and your tests are completely normal, you will receive your results only by: [place] (if you have MyChart) OR A paper copy in the mail If you have any lab test that is abnormal or we need to change your treatment, we will call you to review the results.  Testing/Procedures: Your physician has requested that you have cardiac CT. A cardiac CT angiogram is a procedure to look at the heart and the area around the heart. It may  be done to help find the cause of chest pains or other symptoms of heart disease. During this procedure, a substance called contrast dye is injected into the blood vessels in the area to be checked. A large X-ray machine, called a CT scanner, then takes detailed pictures of the heart and the surrounding area. The procedure is also sometimes called a coronary CT angiogram, coronary  artery scanning, or CTA.   Follow-Up: At [facility], you and your health needs are our priority.  As part of our continuing mission to provide you with exceptional heart care, we have created designated Provider Care Teams.  These Care Teams include your primary Cardiologist (physician) and Advanced Practice Providers (APPs -  Physician Assistants and Nurse Practitioners) who all work together to provide you with the care you need, when you need it.  Your next appointment:   3 month(s)  The format for your next appointment:   In Person  Provider:   Janina Mayo, MD     Other Instructions   Your cardiac CT will be scheduled at one of the below locations:   [facility] [address] [place], [place] [address] [phone]  [place] [address] [place], [place] [address] [phone]  If scheduled at [facility], please arrive at the [facility] main entrance (entrance A) of [facility] 30 minutes prior to test start time. You can use the FREE valet parking offered at the main entrance (encouraged to control the heart rate for the test) Proceed to the [facility] Radiology Department (first floor) to check-in and test prep.  If scheduled at [facility], please arrive 15 mins early for check-in and test prep.  Please follow these instructions carefully (unless otherwise directed):  On the Night Before the Test: Be sure to Drink plenty of water. Do not consume any  caffeinated/decaffeinated beverages or chocolate 12 hours prior to your test. Do not take any antihistamines 12 hours prior to your test. If the patient has contrast allergy: Patient will need a prescription for Prednisone and very clear instructions (as follows): Prednisone 50 mg - take 13 hours prior to test Take another Prednisone 50 mg 7 hours prior to test Take another Prednisone 50 mg 1 hour prior to test Take Benadryl 50 mg 1 hour prior to test Patient must complete all four doses of above prophylactic medications. Patient will need a ride after test due to Benadryl.  On the Day of the Test: Drink plenty of water until 1 hour prior to the test. Do not eat any food 4 hours prior to the test. You may take your regular medications prior to the test.  Take metoprolol (Lopressor) two hours prior to test. HOLD Furosemide/Hydrochlorothiazide morning of the test. FEMALES- please wear underwire-free bra if available, avoid dresses & tight clothing   *For Clinical Staff only. Please instruct patient the following:* Heart Rate Medication Recommendations for Cardiac CT  Resting HR < 50 bpm  No medication  Resting HR 50-60 bpm and BP >110/50 mmHG   Consider Metoprolol tartrate 25 mg PO 90-120 min prior to scan  Resting HR 60-65 bpm and BP >110/50 mmHG  Metoprolol tartrate 50 mg PO 90-120 minutes prior to scan   Resting HR > 65 bpm and BP >110/50 mmHG  Metoprolol tartrate 100 mg PO 90-120 minutes prior to scan  Consider Ivabradine 10-15 mg PO or a calcium channel blocker for resting HR >60 bpm and contraindication to metoprolol tartrate  Consider Ivabradine 10-15 mg PO in combination with metoprolol tartrate for HR >80 bpm         After the Test: Drink plenty of water. After receiving IV contrast, you may experience a mild flushed feeling. This is normal. On occasion, you may experience a mild rash up to 24 hours after the test.  This is not dangerous. If this occurs, you can take  Benadryl 25 mg and increase your fluid intake. If you experience trouble breathing, this can be serious. If it is severe call 911 IMMEDIATELY. If it is mild, please call our office. If you take any of these medications: Glipizide/Metformin, Avandament, Glucavance, please do not take 48 hours after completing test unless otherwise instructed.  Please allow 2-4 weeks for scheduling of routine cardiac CTs. Some insurance companies require a pre-authorization which may delay scheduling of this test.   For non-scheduling related questions, please contact the cardiac imaging nurse navigator should you have any questions/concerns: Marchia Bond, Cardiac Imaging Nurse Navigator Gordy Clement, Cardiac Imaging Nurse Navigator [facility] Direct Office Dial: [phone]   For scheduling needs, including cancellations and rescheduling, please call [place], [phone].     Signed, Janina Mayo, MD  07/05/2021 10:40 AM    [facility]

## 2021-07-05 ENCOUNTER — Ambulatory Visit (INDEPENDENT_AMBULATORY_CARE_PROVIDER_SITE_OTHER): Payer: Medicare Other | Admitting: Internal Medicine

## 2021-07-05 ENCOUNTER — Encounter: Payer: Self-pay | Admitting: Internal Medicine

## 2021-07-05 ENCOUNTER — Other Ambulatory Visit: Payer: Self-pay

## 2021-07-05 VITALS — BP 122/70 | HR 62 | Resp 20 | Ht 62.0 in | Wt 178.0 lb

## 2021-07-05 DIAGNOSIS — Z01818 Encounter for other preprocedural examination: Secondary | ICD-10-CM

## 2021-07-05 DIAGNOSIS — R9431 Abnormal electrocardiogram [ECG] [EKG]: Secondary | ICD-10-CM | POA: Diagnosis not present

## 2021-07-05 DIAGNOSIS — R079 Chest pain, unspecified: Secondary | ICD-10-CM

## 2021-07-05 NOTE — Patient Instructions (Signed)
Medication Instructions:  Your physician recommends that you continue on your current medications as directed. Please refer to the Current Medication list given to you today.  *If you need a refill on your cardiac medications before your next appointment, please call your pharmacy*  Lab Work: Your physician recommends that you return for lab work 1 week prior to Coronary CTA:  BMET If you have labs (blood work) drawn today and your tests are completely normal, you will receive your results only by: [place] (if you have MyChart) OR A paper copy in the mail If you have any lab test that is abnormal or we need to change your treatment, we will call you to review the results.  Testing/Procedures: Your physician has requested that you have cardiac CT. A cardiac CT angiogram is a procedure to look at the heart and the area around the heart. It may be done to help find the cause of chest pains or other symptoms of heart disease. During this procedure, a substance called contrast dye is injected into the blood vessels in the area to be checked. A large X-ray machine, called a CT scanner, then takes detailed pictures of the heart and the surrounding area. The procedure is also sometimes called a coronary CT angiogram, coronary artery scanning, or CTA.   Follow-Up: At [facility], you and your health needs are our priority.  As part of our continuing mission to provide you with exceptional heart care, we have created designated Provider Care Teams.  These Care Teams include your primary Cardiologist (physician) and Advanced Practice Providers (APPs -  Physician Assistants and Nurse Practitioners) who all work together to provide you with the care you need, when you need it.  Your next appointment:   3 month(s)  The format for your next appointment:   In Person  Provider:   Janina Mayo, MD     Other Instructions   Your cardiac CT will be scheduled at one of the below locations:    [facility] [address] [place], [place] [address] [phone]  [place] [address] [place], [place] [address] [phone]  If scheduled at [facility], please arrive at the [facility] main entrance (entrance A) of [facility] 30 minutes prior to test start time. You can use the FREE valet parking offered at the main entrance (encouraged to control the heart rate for the test) Proceed to the [facility] Radiology Department (first floor) to check-in and test prep.  If scheduled at [facility], please arrive 15 mins early for check-in and test prep.  Please follow these instructions carefully (unless otherwise directed):  On the Night Before the Test: Be sure to Drink plenty of water. Do not consume any caffeinated/decaffeinated beverages or chocolate 12 hours prior to your test. Do not take any antihistamines 12 hours prior to your test. If the patient has contrast allergy: Patient will need a prescription for Prednisone and very clear instructions (as follows): Prednisone 50 mg - take 13 hours prior to test Take another Prednisone 50 mg 7 hours prior to test Take another Prednisone 50 mg 1 hour prior to test Take Benadryl 50 mg 1 hour prior to test Patient must complete all four doses of above prophylactic medications. Patient will need a ride after test due to Benadryl.  On the Day of the Test: Drink plenty of water until 1 hour prior to the test.  Do not eat any food 4 hours prior to the test. You may take your regular medications prior to the test.  Take metoprolol (Lopressor) two hours prior to test. HOLD Furosemide/Hydrochlorothiazide morning of the test. FEMALES- please wear underwire-free bra if available, avoid dresses & tight clothing   *For Clinical Staff only. Please instruct patient the following:* Heart Rate Medication Recommendations for  Cardiac CT  Resting HR < 50 bpm  No medication  Resting HR 50-60 bpm and BP >110/50 mmHG   Consider Metoprolol tartrate 25 mg PO 90-120 min prior to scan  Resting HR 60-65 bpm and BP >110/50 mmHG  Metoprolol tartrate 50 mg PO 90-120 minutes prior to scan   Resting HR > 65 bpm and BP >110/50 mmHG  Metoprolol tartrate 100 mg PO 90-120 minutes prior to scan  Consider Ivabradine 10-15 mg PO or a calcium channel blocker for resting HR >60 bpm and contraindication to metoprolol tartrate  Consider Ivabradine 10-15 mg PO in combination with metoprolol tartrate for HR >80 bpm         After the Test: Drink plenty of water. After receiving IV contrast, you may experience a mild flushed feeling. This is normal. On occasion, you may experience a mild rash up to 24 hours after the test. This is not dangerous. If this occurs, you can take Benadryl 25 mg and increase your fluid intake. If you experience trouble breathing, this can be serious. If it is severe call 911 IMMEDIATELY. If it is mild, please call our office. If you take any of these medications: Glipizide/Metformin, Avandament, Glucavance, please do not take 48 hours after completing test unless otherwise instructed.  Please allow 2-4 weeks for scheduling of routine cardiac CTs. Some insurance companies require a pre-authorization which may delay scheduling of this test.   For non-scheduling related questions, please contact the cardiac imaging nurse navigator should you have any questions/concerns: Marchia Bond, Cardiac Imaging Nurse Navigator Gordy Clement, Cardiac Imaging Nurse Navigator [facility] Direct Office Dial: [phone]   For scheduling needs, including cancellations and rescheduling, please call [place], [phone].

## 2021-07-08 ENCOUNTER — Telehealth (HOSPITAL_COMMUNITY): Payer: Self-pay | Admitting: Emergency Medicine

## 2021-07-08 DIAGNOSIS — Z01818 Encounter for other preprocedural examination: Secondary | ICD-10-CM | POA: Diagnosis not present

## 2021-07-08 LAB — BASIC METABOLIC PANEL WITH GFR
BUN/Creatinine Ratio: 35 — ABNORMAL HIGH (ref 12–28)
BUN: 23 mg/dL (ref 8–27)
CO2: 23 mmol/L (ref 20–29)
Calcium: 9.5 mg/dL (ref 8.7–10.3)
Chloride: 104 mmol/L (ref 96–106)
Creatinine, Ser: 0.66 mg/dL (ref 0.57–1.00)
Glucose: 80 mg/dL (ref 70–99)
Potassium: 4.3 mmol/L (ref 3.5–5.2)
Sodium: 141 mmol/L (ref 134–144)
eGFR: 93 mL/min/{1.73_m2}

## 2021-07-08 NOTE — Telephone Encounter (Signed)
Attempted to call patient regarding upcoming cardiac CT appointment. °Left message on voicemail with name and callback number °Kataleena Holsapple RN Navigator Cardiac Imaging °[facility] °[phone] Office °[phone] Cell ° °

## 2021-07-09 ENCOUNTER — Ambulatory Visit (HOSPITAL_COMMUNITY)
Admission: RE | Admit: 2021-07-09 | Discharge: 2021-07-09 | Disposition: A | Discharge status: Home / Self Care | Payer: Medicare Other | Source: Ambulatory Visit | Attending: Internal Medicine | Admitting: Internal Medicine

## 2021-07-09 ENCOUNTER — Other Ambulatory Visit: Payer: Self-pay

## 2021-07-09 ENCOUNTER — Encounter (HOSPITAL_BASED_OUTPATIENT_CLINIC_OR_DEPARTMENT_OTHER): Payer: Medicare Other | Admitting: Physical Therapy

## 2021-07-09 ENCOUNTER — Encounter (HOSPITAL_COMMUNITY): Payer: Self-pay

## 2021-07-09 DIAGNOSIS — R9431 Abnormal electrocardiogram [ECG] [EKG]: Secondary | ICD-10-CM | POA: Diagnosis not present

## 2021-07-09 DIAGNOSIS — R079 Chest pain, unspecified: Secondary | ICD-10-CM | POA: Diagnosis not present

## 2021-07-09 DIAGNOSIS — R931 Abnormal findings on diagnostic imaging of heart and coronary circulation: Secondary | ICD-10-CM | POA: Insufficient documentation

## 2021-07-09 DIAGNOSIS — I2510 Atherosclerotic heart disease of native coronary artery without angina pectoris: Secondary | ICD-10-CM | POA: Insufficient documentation

## 2021-07-09 MED ORDER — NITROGLYCERIN 0.4 MG SL SUBL
SUBLINGUAL_TABLET | SUBLINGUAL | Status: AC
  Filled 2021-07-09: qty 2

## 2021-07-09 MED ORDER — IOHEXOL 350 MG/ML SOLN
95.0000 mL | Freq: Once | INTRAVENOUS | Status: AC | PRN
  Administered 2021-07-09: 12:00:00 95 mL via INTRAVENOUS

## 2021-07-09 MED ORDER — NITROGLYCERIN 0.4 MG SL SUBL
0.8000 mg | SUBLINGUAL_TABLET | Freq: Once | SUBLINGUAL | Status: AC
  Administered 2021-07-09: 12:00:00 0.8 mg via SUBLINGUAL

## 2021-07-10 ENCOUNTER — Telehealth: Payer: Self-pay | Admitting: Internal Medicine

## 2021-07-10 NOTE — Telephone Encounter (Signed)
Patient returned call

## 2021-07-10 NOTE — Telephone Encounter (Signed)
Attempted to call Dawn [name] left a Advertising account executive. She has RCA lesion and would like to talk to her about a heart catheterization. Left [place] office number for her to call back

## 2021-07-11 ENCOUNTER — Telehealth: Payer: Self-pay | Admitting: Internal Medicine

## 2021-07-11 DIAGNOSIS — R079 Chest pain, unspecified: Secondary | ICD-10-CM

## 2021-07-11 DIAGNOSIS — Z01812 Encounter for preprocedural laboratory examination: Secondary | ICD-10-CM

## 2021-07-11 NOTE — Telephone Encounter (Signed)
Returned call to patient, patient is scheduled Next Thursday 12/29 at 9am with Dr. Irish Lack for [facility]. Patient needs to arrive at [facility] at 7 am. Patient is aware of time and date. Advised patient of NPO status after midnight the night before. Advised patient to have labs drawn tomorrow here at [place] office. Orders have been placed. Will send patient instructions through MyChart message.   Advised patient to call back to office with any issues, questions, or concerns. Patient verbalized understanding.

## 2021-07-11 NOTE — Telephone Encounter (Signed)
Pt returning phone call... please advise  

## 2021-07-11 NOTE — Telephone Encounter (Signed)
Please see other encounter.  Patient is scheduled for [facility] 12/29

## 2021-07-11 NOTE — Telephone Encounter (Signed)
Spoke with Dr. Harl Bowie via secure chat. She will call pt back to discuss next steps.

## 2021-07-11 NOTE — Telephone Encounter (Signed)
Spoke with Dawn [name] and discussed left heart catheterization. We discussed the procedure in detail.     The patient understands that risks included but are not limited to stroke (1 in 1000), death (1 in 75), kidney failure [usually temporary] (1 in 500), bleeding (1 in 200), allergic reaction [possibly serious] (1 in 200).

## 2021-07-12 ENCOUNTER — Other Ambulatory Visit: Payer: Self-pay

## 2021-07-12 ENCOUNTER — Other Ambulatory Visit: Payer: Self-pay | Admitting: Orthopedic Surgery

## 2021-07-12 DIAGNOSIS — Z01818 Encounter for other preprocedural examination: Secondary | ICD-10-CM

## 2021-07-12 DIAGNOSIS — R079 Chest pain, unspecified: Secondary | ICD-10-CM | POA: Diagnosis not present

## 2021-07-12 DIAGNOSIS — Z01812 Encounter for preprocedural laboratory examination: Secondary | ICD-10-CM

## 2021-07-12 DIAGNOSIS — I10 Essential (primary) hypertension: Secondary | ICD-10-CM

## 2021-07-12 LAB — BASIC METABOLIC PANEL
BUN/Creatinine Ratio: 24 (ref 12–28)
BUN: 16 mg/dL (ref 8–27)
CO2: 25 mmol/L (ref 20–29)
Calcium: 9.5 mg/dL (ref 8.7–10.3)
Chloride: 102 mmol/L (ref 96–106)
Creatinine, Ser: 0.68 mg/dL (ref 0.57–1.00)
Glucose: 86 mg/dL (ref 70–99)
Potassium: 4.4 mmol/L (ref 3.5–5.2)
Sodium: 138 mmol/L (ref 134–144)
eGFR: 92 mL/min/{1.73_m2} (ref >59)

## 2021-07-12 LAB — CBC
Hematocrit: 39.2 % (ref 34.0–46.6)
Hemoglobin: 13.3 g/dL (ref 11.1–15.9)
MCH: 29.4 pg (ref 26.6–33.0)
MCHC: 33.9 g/dL (ref 31.5–35.7)
MCV: 87 fL (ref 79–97)
Platelets: 192 10*3/uL (ref 150–450)
RBC: 4.52 x10E6/uL (ref 3.77–5.28)
RDW: 14.2 % (ref 11.7–15.4)
WBC: 4.1 10*3/uL (ref 3.4–10.8)

## 2021-07-16 ENCOUNTER — Telehealth: Payer: Self-pay | Admitting: Internal Medicine

## 2021-07-16 MED ORDER — HYDRALAZINE HCL 10 MG PO TABS: 10.0000 mg | ORAL_TABLET | Freq: Three times a day (TID) | ORAL | 3 refills | Status: DC

## 2021-07-16 NOTE — Telephone Encounter (Signed)
°*  STAT* If patient is at the pharmacy, call can be transferred to refill team.   1. Which medications need to be refilled? (please list name of each medication and dose if known)  hydrALAZINE (APRESOLINE) 10 MG tablet  2. Which pharmacy/location (including street and city if local pharmacy) is medication to be sent to? [facility] -  [address], [place], [place] [address]  3. Do they need a 30 day or 90 day supply?   90 day supply  Patient is completely out of medication.

## 2021-07-16 NOTE — Telephone Encounter (Signed)
Refill sent to pharmacy.   

## 2021-07-17 ENCOUNTER — Telehealth: Payer: Self-pay

## 2021-07-17 NOTE — Telephone Encounter (Signed)
Called patient and reviewed the following pre-cath instructions with the patient. Patient has no questions at this time.  Cardiac catheterization scheduled at [facility] for: [facility] Main Entrance [facility]) at: 0700  Diet-no solid food after midnight prior to cath, clear liquids until 5 AM day of procedure.  Medication instructions for procedure: -Usual morning medications can be taken pre-cath with sips of water including aspirin 81 mg.    Confirmed patient has responsible adult to drive home post procedure and be with patient first 24 hours after arriving home.  [facility] does allow one visitor to accompany you and wait in the hospital waiting room while you are there for your procedure.  You and your visitor will be asked to wear a mask once you enter the hospital.  Patient reports does not currently have any new symptoms concerning for COVID-19 and no household members with COVID-19 like illness.

## 2021-07-18 ENCOUNTER — Ambulatory Visit (HOSPITAL_BASED_OUTPATIENT_CLINIC_OR_DEPARTMENT_OTHER): Payer: Medicare Other | Admitting: Physical Therapy

## 2021-07-18 ENCOUNTER — Other Ambulatory Visit: Payer: Self-pay

## 2021-07-18 ENCOUNTER — Ambulatory Visit (HOSPITAL_COMMUNITY)
Admission: RE | Admit: 2021-07-18 | Discharge: 2021-07-18 | Disposition: A | Discharge status: Home / Self Care | Payer: Medicare Other | Attending: Interventional Cardiology | Admitting: Interventional Cardiology

## 2021-07-18 ENCOUNTER — Encounter (HOSPITAL_COMMUNITY)
Admission: RE | Disposition: A | Discharge status: Home / Self Care | Payer: Self-pay | Source: Home / Self Care | Attending: Interventional Cardiology

## 2021-07-18 ENCOUNTER — Other Ambulatory Visit (HOSPITAL_COMMUNITY): Payer: Self-pay

## 2021-07-18 DIAGNOSIS — Z8673 Personal history of transient ischemic attack (TIA), and cerebral infarction without residual deficits: Secondary | ICD-10-CM | POA: Insufficient documentation

## 2021-07-18 DIAGNOSIS — Z7982 Long term (current) use of aspirin: Secondary | ICD-10-CM | POA: Insufficient documentation

## 2021-07-18 DIAGNOSIS — I10 Essential (primary) hypertension: Secondary | ICD-10-CM | POA: Diagnosis not present

## 2021-07-18 DIAGNOSIS — I25118 Atherosclerotic heart disease of native coronary artery with other forms of angina pectoris: Secondary | ICD-10-CM

## 2021-07-18 DIAGNOSIS — I2510 Atherosclerotic heart disease of native coronary artery without angina pectoris: Secondary | ICD-10-CM

## 2021-07-18 DIAGNOSIS — Z955 Presence of coronary angioplasty implant and graft: Secondary | ICD-10-CM | POA: Diagnosis not present

## 2021-07-18 DIAGNOSIS — G35 Multiple sclerosis: Secondary | ICD-10-CM | POA: Diagnosis not present

## 2021-07-18 DIAGNOSIS — Z87891 Personal history of nicotine dependence: Secondary | ICD-10-CM | POA: Insufficient documentation

## 2021-07-18 DIAGNOSIS — G894 Chronic pain syndrome: Secondary | ICD-10-CM | POA: Diagnosis present

## 2021-07-18 DIAGNOSIS — E785 Hyperlipidemia, unspecified: Secondary | ICD-10-CM | POA: Diagnosis not present

## 2021-07-18 DIAGNOSIS — Z79899 Other long term (current) drug therapy: Secondary | ICD-10-CM | POA: Insufficient documentation

## 2021-07-18 DIAGNOSIS — E7800 Pure hypercholesterolemia, unspecified: Secondary | ICD-10-CM

## 2021-07-18 HISTORY — PX: CORONARY STENT INTERVENTION: CATH118234

## 2021-07-18 HISTORY — PX: LEFT HEART CATH AND CORONARY ANGIOGRAPHY: CATH118249

## 2021-07-18 LAB — POCT ACTIVATED CLOTTING TIME: Activated Clotting Time: 329 seconds

## 2021-07-18 SURGERY — LEFT HEART CATH AND CORONARY ANGIOGRAPHY
Anesthesia: LOCAL

## 2021-07-18 MED ORDER — SODIUM CHLORIDE 0.9% FLUSH: 3.0000 mL | Freq: Two times a day (BID) | INTRAVENOUS | Status: DC

## 2021-07-18 MED ORDER — IOHEXOL 350 MG/ML SOLN
INTRAVENOUS | Status: DC | PRN
  Administered 2021-07-18: 11:00:00 65 mL

## 2021-07-18 MED ORDER — CLOPIDOGREL BISULFATE 300 MG PO TABS
ORAL_TABLET | ORAL | Status: DC | PRN
  Administered 2021-07-18: 600 mg via ORAL

## 2021-07-18 MED ORDER — SODIUM CHLORIDE 0.9 % IV SOLN: 250.0000 mL | INTRAVENOUS | Status: DC | PRN

## 2021-07-18 MED ORDER — HYDRALAZINE HCL 20 MG/ML IJ SOLN: 10.0000 mg | INTRAMUSCULAR | Status: DC | PRN

## 2021-07-18 MED ORDER — TICAGRELOR 90 MG PO TABS
180.0000 mg | ORAL_TABLET | Freq: Once | ORAL | Status: AC
  Administered 2021-07-18: 12:00:00 180 mg via ORAL
  Filled 2021-07-18: qty 2

## 2021-07-18 MED ORDER — HEPARIN SODIUM (PORCINE) 1000 UNIT/ML IJ SOLN
INTRAMUSCULAR | Status: AC
  Filled 2021-07-18: qty 10

## 2021-07-18 MED ORDER — HEPARIN (PORCINE) IN NACL 1000-0.9 UT/500ML-% IV SOLN
INTRAVENOUS | Status: AC
  Filled 2021-07-18: qty 500

## 2021-07-18 MED ORDER — FENTANYL CITRATE (PF) 100 MCG/2ML IJ SOLN
INTRAMUSCULAR | Status: AC
  Filled 2021-07-18: qty 2

## 2021-07-18 MED ORDER — MIDAZOLAM HCL 2 MG/2ML IJ SOLN
INTRAMUSCULAR | Status: AC
  Filled 2021-07-18: qty 2

## 2021-07-18 MED ORDER — TICAGRELOR 90 MG PO TABS
90.0000 mg | ORAL_TABLET | Freq: Two times a day (BID) | ORAL | 5 refills | Status: DC
Start: 2021-07-18 — End: 2021-09-12
  Filled 2021-07-18: qty 60, 30d supply, fill #0

## 2021-07-18 MED ORDER — VERAPAMIL HCL 2.5 MG/ML IV SOLN
INTRAVENOUS | Status: AC
  Filled 2021-07-18: qty 2

## 2021-07-18 MED ORDER — ASPIRIN 81 MG PO CHEW: 81.0000 mg | CHEWABLE_TABLET | Freq: Every day | ORAL | Status: DC

## 2021-07-18 MED ORDER — ACETAMINOPHEN 325 MG PO TABS: 650.0000 mg | ORAL_TABLET | ORAL | Status: DC | PRN

## 2021-07-18 MED ORDER — HYDRALAZINE HCL 20 MG/ML IJ SOLN
INTRAMUSCULAR | Status: DC | PRN
  Administered 2021-07-18: 10 mg via INTRAVENOUS

## 2021-07-18 MED ORDER — LIDOCAINE HCL (PF) 1 % IJ SOLN
INTRAMUSCULAR | Status: AC
  Filled 2021-07-18: qty 30

## 2021-07-18 MED ORDER — SODIUM CHLORIDE 0.9 % WEIGHT BASED INFUSION
3.0000 mL/kg/h | INTRAVENOUS | Status: AC
  Administered 2021-07-18: 08:00:00 3 mL/kg/h via INTRAVENOUS

## 2021-07-18 MED ORDER — NITROGLYCERIN 1 MG/10 ML FOR IR/CATH LAB
INTRA_ARTERIAL | Status: DC | PRN
  Administered 2021-07-18: 200 ug via INTRACORONARY
  Administered 2021-07-18: 400 ug via INTRA_ARTERIAL
  Administered 2021-07-18 (×2): 200 ug via INTRACORONARY

## 2021-07-18 MED ORDER — FAMOTIDINE IN NACL 20-0.9 MG/50ML-% IV SOLN
INTRAVENOUS | Status: AC
  Filled 2021-07-18: qty 50

## 2021-07-18 MED ORDER — NITROGLYCERIN 1 MG/10 ML FOR IR/CATH LAB
INTRA_ARTERIAL | Status: AC
  Filled 2021-07-18: qty 10

## 2021-07-18 MED ORDER — HEPARIN (PORCINE) IN NACL 1000-0.9 UT/500ML-% IV SOLN
INTRAVENOUS | Status: DC | PRN
  Administered 2021-07-18 (×2): 500 mL

## 2021-07-18 MED ORDER — SODIUM CHLORIDE 0.9 % WEIGHT BASED INFUSION: 1.0000 mL/kg/h | INTRAVENOUS | Status: DC

## 2021-07-18 MED ORDER — FAMOTIDINE IN NACL 20-0.9 MG/50ML-% IV SOLN
INTRAVENOUS | Status: DC | PRN
  Administered 2021-07-18: 20 mg via INTRAVENOUS

## 2021-07-18 MED ORDER — CLOPIDOGREL BISULFATE 75 MG PO TABS: 75.0000 mg | ORAL_TABLET | Freq: Every day | ORAL | Status: DC

## 2021-07-18 MED ORDER — SODIUM CHLORIDE 0.9% FLUSH: 3.0000 mL | INTRAVENOUS | Status: DC | PRN

## 2021-07-18 MED ORDER — FENTANYL CITRATE (PF) 100 MCG/2ML IJ SOLN
INTRAMUSCULAR | Status: DC | PRN
  Administered 2021-07-18 (×4): 25 ug via INTRAVENOUS

## 2021-07-18 MED ORDER — HYDRALAZINE HCL 20 MG/ML IJ SOLN
INTRAMUSCULAR | Status: AC
  Filled 2021-07-18: qty 1

## 2021-07-18 MED ORDER — VERAPAMIL HCL 2.5 MG/ML IV SOLN
INTRAVENOUS | Status: DC | PRN
  Administered 2021-07-18: 10:00:00 10 mL via INTRA_ARTERIAL

## 2021-07-18 MED ORDER — HEPARIN SODIUM (PORCINE) 1000 UNIT/ML IJ SOLN
INTRAMUSCULAR | Status: DC | PRN
  Administered 2021-07-18: 4000 [IU] via INTRAVENOUS
  Administered 2021-07-18: 2000 [IU] via INTRAVENOUS
  Administered 2021-07-18: 6000 [IU] via INTRAVENOUS

## 2021-07-18 MED ORDER — LABETALOL HCL 5 MG/ML IV SOLN: 10.0000 mg | INTRAVENOUS | Status: DC | PRN

## 2021-07-18 MED ORDER — SODIUM CHLORIDE 0.9 % IV SOLN: INTRAVENOUS | Status: AC

## 2021-07-18 MED ORDER — ONDANSETRON HCL 4 MG/2ML IJ SOLN: 4.0000 mg | Freq: Four times a day (QID) | INTRAMUSCULAR | Status: DC | PRN

## 2021-07-18 MED ORDER — ASPIRIN 81 MG PO CHEW: 81.0000 mg | CHEWABLE_TABLET | ORAL | Status: DC

## 2021-07-18 MED ORDER — VERAPAMIL HCL 2.5 MG/ML IV SOLN
INTRAVENOUS | Status: DC | PRN
  Administered 2021-07-18: 2 mg via INTRA_ARTERIAL

## 2021-07-18 MED ORDER — LIDOCAINE HCL (PF) 1 % IJ SOLN
INTRAMUSCULAR | Status: DC | PRN
  Administered 2021-07-18: 2 mL

## 2021-07-18 MED ORDER — ROSUVASTATIN CALCIUM 10 MG PO TABS
10.0000 mg | ORAL_TABLET | Freq: Every day | ORAL | 2 refills | Status: DC
  Filled 2021-07-18: qty 30, 30d supply, fill #0

## 2021-07-18 MED ORDER — MIDAZOLAM HCL 2 MG/2ML IJ SOLN
INTRAMUSCULAR | Status: DC | PRN
  Administered 2021-07-18 (×2): 1 mg via INTRAVENOUS
  Administered 2021-07-18: 2 mg via INTRAVENOUS

## 2021-07-18 SURGICAL SUPPLY — 19 items
BALLN SAPPHIRE 2.0X15 (BALLOONS) ×3
BALLN SAPPHIRE ~~LOC~~ 3.5X15 (BALLOONS) ×2 IMPLANT
BALLOON SAPPHIRE 2.0X15 (BALLOONS) IMPLANT
CATH 5FR JL3.5 JR4 ANG PIG MP (CATHETERS) ×2 IMPLANT
CATH INFINITI 5 FR 3DRC (CATHETERS) ×2 IMPLANT
CATH LAUNCHER 6FR JR4 (CATHETERS) ×2 IMPLANT
DEVICE RAD COMP TR BAND LRG (VASCULAR PRODUCTS) ×2 IMPLANT
ELECT DEFIB PAD ADLT CADENCE (PAD) ×2 IMPLANT
GLIDESHEATH SLEND SS 6F .021 (SHEATH) ×2 IMPLANT
GUIDEWIRE INQWIRE 1.5J.035X260 (WIRE) IMPLANT
INQWIRE 1.5J .035X260CM (WIRE) ×3
KIT ENCORE 26 ADVANTAGE (KITS) ×2 IMPLANT
KIT HEART LEFT (KITS) ×3 IMPLANT
PACK CARDIAC CATHETERIZATION (CUSTOM PROCEDURE TRAY) ×3 IMPLANT
SHEATH PROBE COVER 6X72 (BAG) ×2 IMPLANT
STENT ONYX FRONTIER 3.0X30 (Permanent Stent) ×2 IMPLANT
TRANSDUCER W/STOPCOCK (MISCELLANEOUS) ×3 IMPLANT
TUBING CIL FLEX 10 FLL-RA (TUBING) ×3 IMPLANT
WIRE ASAHI PROWATER 180CM (WIRE) ×2 IMPLANT

## 2021-07-18 NOTE — Interval H&P Note (Signed)
Cath Lab Visit (complete for each Cath Lab visit)  Clinical Evaluation Leading to the Procedure:   ACS: No.  Non-ACS:    Anginal Classification: CCS III  Anti-ischemic medical therapy: Minimal Therapy (1 class of medications)  Non-Invasive Test Results: High-risk stress test findings: cardiac mortality >3%/year  Prior CABG: No previous CABG      History and Physical Interval Note:  07/18/2021 10:02 AM  Dawn [name]  has presented today for surgery, with the diagnosis of abnormal coronary ct.  The various methods of treatment have been discussed with the patient and family. After consideration of risks, benefits and other options for treatment, the patient has consented to  Procedure(s): LEFT HEART CATH AND CORONARY ANGIOGRAPHY (N/A) as a surgical intervention.  The patient's history has been reviewed, patient examined, no change in status, stable for surgery.  I have reviewed the patient's chart and labs.  Questions were answered to the patient's satisfaction.     Larae Grooms

## 2021-07-18 NOTE — Discharge Summary (Addendum)
Discharge Summary for Same Day PCI   Patient ID: Dawn [name] MRN: [id number]; DOB: 25-Sep-1945  Admit date: 07/18/2021 Discharge date: 07/18/2021  Primary Care Provider: Sandrea Hughs, NP  Primary Cardiologist: Janina Mayo, MD  Primary Electrophysiologist:  None   Discharge Diagnoses    Principal Problem:   Chronic pain syndrome Active Problems:   Hyperlipidemia   Multiple sclerosis [name])   Essential hypertension   Coronary artery disease    Diagnostic Studies/Procedures    Left Cardiac Catheterization 07/18/2021:   Mid Cx lesion is 25% stenosed.   Prox LAD to Mid LAD lesion is 25% stenosed.   Prox RCA to Mid RCA lesion is 99% stenosed.  Left-to-right collaterals.   A drug-eluting stent was successfully placed using a STENT ONYX FRONTIER 3.0X30, postdilated to 3.5 mm.   Post intervention, there is a 0% residual stenosis.   The left ventricular systolic function is normal.   LV end diastolic pressure is normal.   The left ventricular ejection fraction is 55-65% by visual estimate.   There is no aortic valve stenosis.   Successful PCI of the mid RCA.  Brisk left to right collaterals.  It is likely that she had a ruptured plaque in November and developed collaterals since that time.  Continue aggressive secondary prevention.  Plavix was used for antiplatelet therapy in the Cath Lab.  We will need to see with her other multiple sclerosis medicines, if Brilinta may be a better choice due to drug interactions.  We will also have to check on which statin would not cause drug interaction.   Of note, she had significant right radial vasospasm.  This required additional verapamil and nitroglycerin in the radial artery.  We also used a heating pad on her right forearm.  She required 4 mg of Versed and 100 mg of fentanyl for sedation and this also seemed to help with the vasospasm.   Addendum: After review with the Pharm.D., Brilinta would be a better choice than clopidogrel.  We will send her home with a supply of Brilinta after 180 mg loading dose.  Diagnostic Dominance: Right Intervention    History of Present Illness     Dawn [name] is a 75 y.o. female with a history of hypertension with recent admission for hypertensive emergency, hyperlipidemia, prior CVA, multiple sclerosis with chronic pain, Sjogren's syndrome, and GERD.  Patient was recently admitted in 05/2021 with chest pain and elevated troponin in the setting of hypertensive emergency with systolic BP as high as 275 at home.  High-sensitivity troponin was elevated but flat peaking in the 300s felt to be due to demand ischemia. Echo showed LVEF of 60 to 65% with no regional wall motion abnormalities, mild LVH, grade 1 diastolic dysfunction, mild MR.  She was initially thought to have a component of diastolic CHF but when evaluated by cardiology felt to just be due to hypertensive emergency.  She was started on Coreg and Hydralazine. She was referred to Cardiology as an outpatient and seen by Dr. Phineas Inches in 07/05/2021 at which time she reported feeling lightheaded and tired but no recurrent chest pain and no shortness of breath.  Coronary CTA was ordered for further evaluation and showed coronary calcium score of 141 (71st percentile for age and sex) with severe stenosis of the left main. Therefore, cardiac catheterization was arranged for further evaluation.  Hospital Course     Patient presented to [facility] on 07/18/2021 cardiac catheterization. Cath showed 89% stenosis of proximal  to mid RCA with left-to-right collaterals, 25% of proximal to mid LAD, and 25% stenosis of mid LCx.  Patient underwent successful PCI with DES to RCA lesion.  She was initially started on Plavix but was then switched to Brilinta due to a drug interaction with Modafinil. She was load with Brilinta 180mg  in short stay. Plan is for DAPT with Aspirin 81 mg daily and Brilinta 90 mg twice daily for at least 12 months. Will  also add statin. The patient was seen by Cardiac Rehab while in short stay. There were no observed complications post cath. Right radial cath site was re-evaluated prior to discharge and showed some ecchymosis proximal to edge of TR band but stable. Instructions/precautions regarding cath site care were given prior to discharge.  Patient was seen by Dr. Irish Lack and determined stable for discharge home. Follow up with our office has been arranged. Medications are listed below. Pertinent changes include addition of Brilinta 90mg  twice daily and Crestor 10mg  daily.   Of note, patient has a history of statin intolerance due to muscle aches. I personally spoke with patient who states she was previously started on Crestor years ago but developed significant muscle aches with this and it was stopped.  Discussed need for better control of lipids LDL of 142 in 05/2021.  She is willing to retry Crestor 10 mg daily (neither Lipitor or Crestor or deal with her Leflunomide but Crestor 10 mg okay per Pharmacy).  Patient will let us know if she has recurrent muscle aches with this. We will also refer her to outpatient lipid clinic for consideration of PCSK9 inhibitor. _____________  Cath/PCI Registry Performance & Quality Measures: Aspirin prescribed? - Yes ADP Receptor Inhibitor (Plavix/Clopidogrel, Brilinta/Ticagrelor or Effient/Prasugrel) prescribed (includes medically managed patients)? - Yes High Intensity Statin (Lipitor 40-80mg  or Crestor 20-40mg ) prescribed? - Yes For EF <40%, was ACEI/ARB prescribed? - Not Applicable (EF >/= 16%) For EF <40%, Aldosterone Antagonist (Spironolactone or Eplerenone) prescribed? - Not Applicable (EF >/= 10%) Cardiac Rehab Phase II ordered (Included Medically managed Patients)? - Yes  _____________   Discharge Vitals Blood pressure (!) 157/52, pulse 66, temperature 98.3 F (36.8 C), temperature source Oral, resp. rate 14, height 5\' 2"  (1.575 m), weight 80.5 kg, SpO2 98 %.   Filed Weights   07/18/21 0704  Weight: 80.5 kg    Last Labs & Radiologic Studies    CBC No results for input(s): WBC, NEUTROABS, HGB, HCT, MCV, PLT in the last 72 hours. Basic Metabolic Panel No results for input(s): NA, K, CL, CO2, GLUCOSE, BUN, CREATININE, CALCIUM, MG, PHOS in the last 72 hours. Liver Function Tests No results for input(s): AST, ALT, ALKPHOS, BILITOT, PROT, ALBUMIN in the last 72 hours. No results for input(s): LIPASE, AMYLASE in the last 72 hours. High Sensitivity Troponin:   No results for input(s): TROPONINIHS in the last 720 hours.  BNP Invalid input(s): POCBNP D-Dimer No results for input(s): DDIMER in the last 72 hours. Hemoglobin A1C No results for input(s): HGBA1C in the last 72 hours. Fasting Lipid Panel No results for input(s): CHOL, HDL, LDLCALC, TRIG, CHOLHDL, LDLDIRECT in the last 72 hours. Thyroid Function Tests No results for input(s): TSH, T4TOTAL, T3FREE, THYROIDAB in the last 72 hours.  Invalid input(s): FREET3 _____________  CARDIAC CATHETERIZATION  Result Date: 07/18/2021   Mid Cx lesion is 25% stenosed.   Prox LAD to Mid LAD lesion is 25% stenosed.   Prox RCA to Mid RCA lesion is 99% stenosed.  Left-to-right collaterals.  A drug-eluting stent was successfully placed using a STENT ONYX FRONTIER 3.0X30, postdilated to 3.5 mm.   Post intervention, there is a 0% residual stenosis.   The left ventricular systolic function is normal.   LV end diastolic pressure is normal.   The left ventricular ejection fraction is 55-65% by visual estimate.   There is no aortic valve stenosis. Successful PCI of the mid RCA.  Brisk left to right collaterals.  It is likely that she had a ruptured plaque in November and developed collaterals since that time.  Continue aggressive secondary prevention.  Plavix was used for antiplatelet therapy in the Cath Lab.  We will need to see with her other multiple sclerosis medicines, if Brilinta may be a better choice due to  drug interactions.  We will also have to check on which statin would not cause drug interaction. Of note, she had significant right radial vasospasm.  This required additional verapamil and nitroglycerin in the radial artery.  We also used a heating pad on her right forearm.  She required 4 mg of Versed and 100 mg of fentanyl for sedation and this also seemed to help with the vasospasm. Addendum: After review with the Pharm.D., Brilinta would be a better choice than clopidogrel.  We will send her home with a supply of Brilinta after 180 mg loading dose.  CT CORONARY MORPH W/CTA COR W/SCORE W/CA W/CM &/OR WO/CM  Result Date: 07/09/2021 CLINICAL DATA:  75 Year old White Female EXAM: Cardiac/Coronary  CTA TECHNIQUE: The patient was scanned on a [employer]. FINDINGS: Scan was triggered in the descending thoracic aorta. Axial non-contrast 3 mm slices were carried out through the heart. The data set was analyzed on a dedicated work station and scored using the [place]. Gantry rotation speed was 250 msecs and collimation was .6 mm. 0.8 mg of sl NTG was given. The 3D data set was reconstructed in 5% intervals of the 67-82 % of the R-R cycle. Diastolic phases were analyzed on a dedicated work station using MPR, MIP and VRT modes. The patient received 95 cc of contrast. Aorta: Normal size. Scattered aortic calcifications. No dissection. Main Pulmonary Artery: Normal size of the pulmonary artery. Aortic Valve:  Tri-leaflet.  Aortic Valve calcium score 664. Coronary Arteries:  Normal coronary origin.  Right dominance. Coronary Calcium Score: Left main: 12 Left anterior descending artery: 98 Left circumflex artery: 24 Right coronary artery: 7 Total: 141 Percentile: 71st for age, sex, and race matched control. RCA is a large dominant artery that gives rise to PDA and PLA. There is a severe soft plaque stenosis on the proximal vessel. Left main is a large artery that gives rise to LAD and LCX arteries.  Minimal non obstructive distal calcified plaque LAD is a large vessel that gives rise to one large D1 Branch. There is a moderate stenosis mixed plaque in the proximal vessel. LCX is a non-dominant artery. Mild non obstructive plaque in the proximal vessel. Other findings: Normal variant pulmonary vein drainage into the left atrium- presence of left middle pulmonary vein. Normal left atrial appendage without a thrombus. Extra-cardiac findings: See attached radiology report for non-cardiac structures. IMPRESSION: 1. Coronary calcium score of 141. This was 71st percentile for age, sex, and race matched control. 2. Normal coronary origin with right dominance. 3. CAD-RADS 4 Severe stenosis. (70-99% or > 50% left main). Cardiac catheterization or CT FFR is recommended. Consider symptom-guided anti-ischemic pharmacotherapy as well as risk factor modification per guideline directed care. RECOMMENDATIONS:  Coronary artery calcium (CAC) score is a strong predictor of incident coronary heart disease (CHD) and provides predictive information beyond traditional risk factors. CAC scoring is reasonable to use in the decision to withhold, postpone, or initiate statin therapy in intermediate-risk or selected borderline-risk asymptomatic adults (age 73-75 years and LDL-C >=70 to <190 mg/dL) who do not have diabetes or established atherosclerotic cardiovascular disease (ASCVD).* In intermediate-risk (10-year ASCVD risk >=7.5% to <20%) adults or selected borderline-risk (10-year ASCVD risk >=5% to <7.5%) adults in whom a CAC score is measured for the purpose of making a treatment decision the following recommendations have been made: If CAC = 0, it is reasonable to withhold statin therapy and reassess in 5 to 10 years, as long as higher risk conditions are absent (diabetes mellitus, family history of premature CHD in first degree relatives (males <55 years; females <65 years), cigarette smoking, LDL >=190 mg/dL or other independent  risk factors). If CAC is 1 to 99, it is reasonable to initiate statin therapy for patients >=73 years of age. If CAC is >=100 or >=75th percentile, it is reasonable to initiate statin therapy at any age. Cardiology referral should be considered for patients with CAC scores =400 or >=75th percentile. *2018 AHA/ACC/AACVPR/AAPA/ABC/ACPM/ADA/AGS/APhA/ASPC/NLA/PCNA Guideline on the Management of Blood Cholesterol: A Report of the American College of Cardiology/American Heart Association Task Force on Clinical Practice Guidelines. J Am Coll Cardiol. 2019;73(24):3168-3209. Rudean Haskell, MD Electronically Signed   By: Rudean Haskell M.D.   On: 07/09/2021 14:57   CT CORONARY FRACTIONAL FLOW RESERVE DATA PREP  Result Date: 07/09/2021 EXAM: CT-FFR ANALYSIS CLINICAL DATA:  Possibly obstructive coronary lesion (LAD, RCA) FINDINGS: CT-FFR analysis was performed on the original cardiac CT angiogram dataset. Diagrammatic representation of the CT-FFR analysis is provided in a separate PDF document in PACS. This dictation was created using the PDF document and an interactive 3D model of the results. 3D model is not available in the EMR/PACS. Normal FFR range is >0.80. 1. Left Main: No significant functional stenosis, CT-FFR 0.99. 2. LAD: No significant functional stenosis, CT-FFR 0.96 to 0.91 at LAD lesion. 3. LCX: No significant functional stenosis, CT-FFR 0.89 distal vessel. 4. RCA: significant functional stenosis, CT-FFR 0.54. IMPRESSION: 1. CT FFR analysis shows evidence of significant functional stenosis- RCA. Rudean Haskell MD Electronically Signed   By: Rudean Haskell M.D.   On: 07/09/2021 15:02    Disposition   Pt is being discharged home today in good condition.  Follow-up Plans & Appointments     Follow-up Information     Janina Mayo, MD Follow up.   Specialty: Cardiology Why: Follow-up with Dr. Harl Bowie is scheduled for 08/02/2021 at 8:00am. Please arrive 15 minutes early for  check-in. If this date/time does not work for you, please call our office to reschedule. Contact information: [address] [place] [address] [phone]                Discharge Instructions     AMB Referral to Advanced Lipid Disorders Clinic   Complete by: As directed    Reason for referral: PCSK9 inhibitor education and prior authorization approvals   Referred to: PharmD   Internal Lipid Clinic Referral Scheduling  Internal lipid clinic referrals are providers within [facility], who wish to refer established patients for routine management (help in starting PCSK9 inhibitor therapy) or advanced therapies.  Internal MD referral criteria:              1. All patients with  LDL>190 mg/dL  2. All patients with Triglycerides >500 mg/dL  3. Patients with suspected or confirmed heterozygous familial hyperlipidemia (HeFH) or homozygous familial hyperlipidemia (HoFH)  4. Patients with family history of suspicious for genetic dyslipidemia desiring genetic testing  5. Patients refractory to standard guideline based therapy  6. Patients with statin intolerance (failed 2 statins, one of which must be a high potency statin)  7. Patients who the provider desires to be seen by MD   Internal PharmD referral criteria:   1. Follow-up patients for medication management  2. Follow-up for compliance monitoring  3. Patients for drug education  4. Patients with statin intolerance  5. PCSK9 inhibitor education and prior authorization approvals  6. Patients with triglycerides <500 mg/dL  External Lipid Clinic Referral  External lipid clinic referrals are for providers outside of [facility], considered new clinic patients - automatically routed to MD schedule   AMB Referral to Cardiac Rehabilitation - Phase II   Complete by: As directed    Diagnosis: Coronary Stents   After initial evaluation and assessments completed: Virtual Based Care may be provided alone or in  conjunction with Phase 2 Cardiac Rehab based on patient barriers.: Yes        Discharge Medications   Allergies as of 07/18/2021       Reactions   Statins Other (See Comments)   Muscle pain        Medication List     TAKE these medications    aspirin EC 81 MG tablet Take 81 mg by mouth daily. Swallow whole.   Brilinta 90 MG Tabs tablet Generic drug: ticagrelor Take 1 tablet (90 mg total) by mouth 2 (two) times daily.   carvedilol 6.25 MG tablet Commonly known as: COREG Take 1 tablet (6.25 mg total) by mouth 2 (two) times daily with a meal.   cholecalciferol 25 MCG (1000 UNIT) tablet Commonly known as: VITAMIN D Take 2,000 Units by mouth daily.   clonazePAM 0.5 MG tablet Commonly known as: KLONOPIN TAKE 1 TABLET(0.5 MG) BY MOUTH AT BEDTIME   fexofenadine 180 MG tablet Commonly known as: ALLEGRA Take 180 mg by mouth at bedtime.   FLAXSEED OIL PO Take 5-10 mLs by mouth 3 (three) times a week. Power   hydrALAZINE 10 MG tablet Commonly known as: APRESOLINE Take 1 tablet (10 mg total) by mouth every 8 (eight) hours.   leflunomide 20 MG tablet Commonly known as: ARAVA Take 1 tablet (20 mg total) by mouth daily.   Lidocaine 4 % Ptch Place 1 patch onto the skin daily as needed (mild pain). Remove & Discard patch within 12 hours or as directed by MD   losartan 100 MG tablet Commonly known as: COZAAR TAKE 1 TABLET(100 MG) BY MOUTH DAILY   modafinil 200 MG tablet Commonly known as: PROVIGIL Take 1 tablet (200 mg total) by mouth daily. What changed: how much to take   nitroGLYCERIN 0.4 MG SL tablet Commonly known as: NITROSTAT Place 1 tablet (0.4 mg total) under the tongue every 5 (five) minutes as needed for chest pain.   omeprazole 40 MG capsule Commonly known as: PRILOSEC TAKE 1 CAPSULE(40 MG) BY MOUTH DAILY   rosuvastatin 10 MG tablet Commonly known as: CRESTOR Take 1 tablet (10 mg total) by mouth daily.   solifenacin 5 MG tablet Commonly known  as: VESICARE Take 1 tablet (5 mg total) by mouth daily.           Allergies Allergies  Allergen Reactions  Statins Other (See Comments)    Muscle pain    Outstanding Labs/Studies   N/A.  Duration of Discharge Encounter   Greater than 30 minutes including physician time.  Signed, Darreld Mclean, PA-C 07/18/2021, 4:34 PM  I have examined the patient and reviewed assessment and plan and discussed with patient.  Agree with above as stated.    Successful PCI of RCA.  Brilinta used as it works better with her MS meds.  Low dose Crestor and possible PCSK9 inhibitor for lipids. Dose limited to 10 mg for Crestor with MS meds.    Right wrist is stable.  Plan for same day PCI.   Larae Grooms

## 2021-07-18 NOTE — Progress Notes (Signed)
CARDIAC REHAB PHASE I   Stent education completed with pt. Pt educated on importance of ASA and Brilinta. Pt states previous intolerance to statins. Pt given stent card and heart healthy diet. Reviewed site care and restrictions. Encouraged activity with emphasis on safety. Pt states very limited mobility and falls due to MS. Referral placed for CRP II [place], pt unsure if she will be able to participate.  [id number] Rufina Falco, RN BSN 07/18/2021 1:31 PM

## 2021-07-18 NOTE — Discharge Instructions (Signed)
Medication Changes: - START Brilinta 90mg  twice daily in addition to Aspirin 81mg  daily which you are already taking at home. These medications are very important in helping keep the new stent in your heart open. You will need to take the first dose on the evening of 07/18/2021. - START Rosuvastatin (Crestor) 10 mg daily. If you are unable to tolerate this due to muscle aches, please let us know. We can stop this if you are unable to tolerate it. Will also refer you to our Lipid Clinic to discuss other options.  - Continue all other home medications as prescribed.

## 2021-07-19 ENCOUNTER — Encounter (HOSPITAL_COMMUNITY): Payer: Self-pay | Admitting: Interventional Cardiology

## 2021-07-19 ENCOUNTER — Telehealth: Payer: Self-pay | Admitting: *Deleted

## 2021-07-19 ENCOUNTER — Telehealth: Payer: Self-pay | Admitting: Internal Medicine

## 2021-07-19 MED ORDER — CARVEDILOL 6.25 MG PO TABS: 6.2500 mg | ORAL_TABLET | Freq: Two times a day (BID) | ORAL | 3 refills | Status: DC

## 2021-07-19 NOTE — Telephone Encounter (Signed)
Transition Care Management Follow-up Telephone Call Date of discharge and from where: 07/18/2021 [place] How have you been since you were released from the hospital? Better, hand in splint Any questions or concerns? No  Items Reviewed: Did the pt receive and understand the discharge instructions provided? Yes  Medications obtained and verified? Yes  Other? No  Any new allergies since your discharge? No  Dietary orders reviewed? Yes Do you have support at home? Yes   Home Care and Equipment/Supplies: Were home health services ordered? No. Wants to discuss If so, what is the name of the agency? na  Has the agency set up a time to come to the patient's home? not applicable Were any new equipment or medical supplies ordered?  No What is the name of the medical supply agency? na Were you able to get the supplies/equipment? not applicable Do you have any questions related to the use of the equipment or supplies? No  Functional Questionnaire: (I = Independent and D = Dependent) ADLs: I  Bathing/Dressing- I  Meal Prep- I  Eating- I  Maintaining continence- I  Transferring/Ambulation- I with assistance, Cane due to Hand splint  Managing Meds- I  Follow up appointments reviewed:  PCP Hospital f/u appt confirmed? Yes  Scheduled to see Dinah on 07/25/2021 @ 11. [place] [facility] f/u appt confirmed? No   Are transportation arrangements needed? No  If their condition worsens, is the pt aware to call PCP or go to the Emergency Dept.? Yes Was the patient provided with contact information for the PCP's office or ED? Yes Was to pt encouraged to call back with questions or concerns? Yes

## 2021-07-19 NOTE — Telephone Encounter (Signed)
°*  STAT* If patient is at the pharmacy, call can be transferred to refill team.   1. Which medications need to be refilled? (please list name of each medication and dose if known) carvedilol (COREG) 6.25 MG tablet  2. Which pharmacy/location (including street and city if local pharmacy) is medication to be sent to? [id number] - [place], [place] - [address]  3. Do they need a 30 day or 90 day supply? [place]

## 2021-07-24 ENCOUNTER — Encounter (HOSPITAL_BASED_OUTPATIENT_CLINIC_OR_DEPARTMENT_OTHER): Payer: Medicare Other | Admitting: Physical Therapy

## 2021-07-25 ENCOUNTER — Other Ambulatory Visit: Payer: Self-pay

## 2021-07-25 ENCOUNTER — Encounter: Payer: Self-pay | Admitting: Family

## 2021-07-25 ENCOUNTER — Ambulatory Visit (INDEPENDENT_AMBULATORY_CARE_PROVIDER_SITE_OTHER): Payer: Medicare Other | Admitting: Family

## 2021-07-25 VITALS — BP 138/76 | HR 73 | Temp 97.0°F | Resp 16 | Ht 62.0 in | Wt 175.2 lb

## 2021-07-25 DIAGNOSIS — R5382 Chronic fatigue, unspecified: Secondary | ICD-10-CM

## 2021-07-25 DIAGNOSIS — H35033 Hypertensive retinopathy, bilateral: Secondary | ICD-10-CM | POA: Diagnosis not present

## 2021-07-25 DIAGNOSIS — I5032 Chronic diastolic (congestive) heart failure: Secondary | ICD-10-CM

## 2021-07-25 DIAGNOSIS — M545 Low back pain, unspecified: Secondary | ICD-10-CM | POA: Diagnosis not present

## 2021-07-25 DIAGNOSIS — H47323 Drusen of optic disc, bilateral: Secondary | ICD-10-CM | POA: Diagnosis not present

## 2021-07-25 DIAGNOSIS — E782 Mixed hyperlipidemia: Secondary | ICD-10-CM

## 2021-07-25 DIAGNOSIS — H35362 Drusen (degenerative) of macula, left eye: Secondary | ICD-10-CM | POA: Diagnosis not present

## 2021-07-25 DIAGNOSIS — G35D Multiple sclerosis, unspecified: Secondary | ICD-10-CM

## 2021-07-25 DIAGNOSIS — I10 Essential (primary) hypertension: Secondary | ICD-10-CM | POA: Diagnosis not present

## 2021-07-25 DIAGNOSIS — G8929 Other chronic pain: Secondary | ICD-10-CM | POA: Diagnosis not present

## 2021-07-25 DIAGNOSIS — G35 Multiple sclerosis: Secondary | ICD-10-CM

## 2021-07-25 DIAGNOSIS — I2510 Atherosclerotic heart disease of native coronary artery without angina pectoris: Secondary | ICD-10-CM

## 2021-07-25 DIAGNOSIS — H538 Other visual disturbances: Secondary | ICD-10-CM | POA: Diagnosis not present

## 2021-07-25 LAB — CBC WITH DIFFERENTIAL/PLATELET
Absolute Monocytes: 414 cells/uL (ref 200–950)
Basophils Absolute: 40 cells/uL (ref 0–200)
Basophils Relative: 0.9 %
Eosinophils Absolute: 189 cells/uL (ref 15–500)
Eosinophils Relative: 4.3 %
HCT: 41.1 % (ref 35.0–45.0)
Hemoglobin: 13.5 g/dL (ref 11.7–15.5)
Lymphs Abs: 440 cells/uL — ABNORMAL LOW (ref 850–3900)
MCH: 29 pg (ref 27.0–33.0)
MCHC: 32.8 g/dL (ref 32.0–36.0)
MCV: 88.2 fL (ref 80.0–100.0)
MPV: 11.7 fL (ref 7.5–12.5)
Monocytes Relative: 9.4 %
Neutro Abs: 3318 cells/uL (ref 1500–7800)
Neutrophils Relative %: 75.4 %
Platelets: 241 10*3/uL (ref 140–400)
RBC: 4.66 10*6/uL (ref 3.80–5.10)
RDW: 14.4 % (ref 11.0–15.0)
Total Lymphocyte: 10 %
WBC: 4.4 10*3/uL (ref 3.8–10.8)

## 2021-07-25 LAB — BASIC METABOLIC PANEL WITH GFR
BUN: 17 mg/dL (ref 7–25)
CO2: 26 mmol/L (ref 20–32)
Calcium: 9.9 mg/dL (ref 8.6–10.4)
Chloride: 104 mmol/L (ref 98–110)
Creat: 0.67 mg/dL (ref 0.60–1.00)
Glucose, Bld: 88 mg/dL (ref 65–99)
Potassium: 4.5 mmol/L (ref 3.5–5.3)
Sodium: 138 mmol/L (ref 135–146)
eGFR: 92 mL/min/{1.73_m2} (ref >=60)

## 2021-07-25 NOTE — Progress Notes (Signed)
Provider: Marlowe Sax FNP-C  Kimberlie Csaszar, [name], NP  Patient Care Team: Maddisyn Hegwood, [name], NP as PCP - General (Family Medicine) Janina Mayo, MD as PCP - Cardiology (Cardiology) [name], MD as Consulting Physician (Orthopedic Surgery) [name], [name], MD (Neurology) [name], [name], [place] (Optometry) [name], NP as Nurse Practitioner (Obstetrics and Gynecology) [name], MD as Consulting Physician (Dermatology)  Extended Emergency Contact Information Primary Emergency Contact: Cephus Richer) Home Phone: [phone] Mobile Phone: [phone] Relation: Relative Secondary Emergency Contact: [facility] Phone: [phone] Mobile Phone: [phone] Relation: Friend  Code Status:  Full Code  Goals of care: Advanced Directive information Advanced Directives 07/25/2021  Does Patient Have a Medical Advance Directive? Yes  Type of Advance Directive Living will  Does patient want to make changes to medical advance directive? No - Patient declined  Copy of Healthcare Power of Attorney in Chart? -  Would patient like information on creating a medical advance directive? -     Chief Complaint  Patient presents with   Transitions Of Care    [facility] 07/18/2021-07/18/2021    HPI:  Pt is a 76 y.o. female seen today for an acute visit for transition of care post hospital admission 07/18/2021 for left Cath She was previously admitted 06/14/2021 - 07/16/2021 for chest pain with elevated troponin ,acute diastolic congestive Heart Failure and uncontrolled Hypertension. She had a squeezing sensation across her sternum and lower rib cage and associated with painful sensation from both her elbows down to the hands.also had nausea but no vomiting.she developed new onset shortness of breath so went to ED for evaluation.ECHO done 06/15/2021 EF 60-65%.Her EKG showed no ischemia.Her coreg was increased to 6.25 mg twice daily and Hydralazine 10 mg TID was  started.,losartan 100 mg daily.she was advised to check weight daily ,strict in and out.she was discharged to establish care at [place] for new onset grade 1 diastolic CHF and uncontrolled Hypertension. She denies any acute issues today.Has follow up appointment with Cardiologist on 08/02/2021.  Past Medical History:  Diagnosis Date   Allergy    Dry eyes    Endometrial polyp    Essential hypertension 08/25/2018   GERD (gastroesophageal reflux disease)    History of hiatal hernia    History of kidney stones    Hot flashes, menopausal 10/13/2011   Estradiol started    Jaundice as teenager   no problems since   Memory loss 09/21/2019     1/2 of feet numb all the time   Multiple sclerosis ([place]) dx 2001   Neuropathy    Neuropathy    bilateral feet   Osteoarthritis    Sjogren's syndrome ([place]) dx oct 2021   sore muscvles, dry mouth and eyes   Stroke [facility])    Vertigo    Vision abnormalities    Past Surgical History:  Procedure Laterality Date   ABDOMINAL HYSTERECTOMY  2002   partial   colonscopy  2011   CORONARY STENT INTERVENTION N/A 07/18/2021   Procedure: CORONARY STENT INTERVENTION;  Surgeon: Jettie Booze, MD;  Location: [place] [facility];  Service: Cardiovascular;  Laterality: N/A;   DILATATION & CURETTAGE/HYSTEROSCOPY WITH MYOSURE N/A 06/08/2020   Procedure: DILATATION & CURETTAGE/HYSTEROSCOPY/Polypectomy WITH MYOSURE;  Surgeon: Armandina Stammer, DO;  Location: [facility];  Service: Gynecology;  Laterality: N/A;   LEFT HEART CATH AND CORONARY ANGIOGRAPHY N/A 07/18/2021   Procedure: LEFT HEART CATH AND CORONARY ANGIOGRAPHY;  Surgeon: Jettie Booze, MD;  Location: [place] [facility];  Service: Cardiovascular;  Laterality: N/A;   LUMBAR FUSION  2001   TOTAL HIP ARTHROPLASTY Right 01/17/2021   Procedure: TOTAL HIP ARTHROPLASTY ANTERIOR APPROACH;  Surgeon: Rod Can, MD;  Location: [facility];  Service: Orthopedics;   Laterality: Right;   UPPER GI ENDOSCOPY  yrs ago    Allergies  Allergen Reactions   Statins Other (See Comments)    Muscle pain    Outpatient Encounter Medications as of 07/25/2021  Medication Sig   aspirin EC 81 MG tablet Take 81 mg by mouth daily. Swallow whole.   carvedilol (COREG) 6.25 MG tablet Take 1 tablet (6.25 mg total) by mouth 2 (two) times daily with a meal.   cholecalciferol (VITAMIN D) 25 MCG (1000 UNIT) tablet Take 2,000 Units by mouth daily.   clonazePAM (KLONOPIN) 0.5 MG tablet TAKE 1 TABLET(0.5 MG) BY MOUTH AT BEDTIME   fexofenadine (ALLEGRA) 180 MG tablet Take 180 mg by mouth at bedtime.    Flaxseed, Linseed, (FLAXSEED OIL PO) Take 5-10 mLs by mouth 3 (three) times a week. Power   hydrALAZINE (APRESOLINE) 10 MG tablet Take 1 tablet (10 mg total) by mouth every 8 (eight) hours.   leflunomide (ARAVA) 20 MG tablet Take 1 tablet (20 mg total) by mouth daily.   Lidocaine 4 % PTCH Place 1 patch onto the skin daily as needed (mild pain). Remove & Discard patch within 12 hours or as directed by MD   losartan (COZAAR) 100 MG tablet TAKE 1 TABLET(100 MG) BY MOUTH DAILY   modafinil (PROVIGIL) 200 MG tablet Take 1 tablet (200 mg total) by mouth daily.   nitroGLYCERIN (NITROSTAT) 0.4 MG SL tablet Place 1 tablet (0.4 mg total) under the tongue every 5 (five) minutes as needed for chest pain.   omeprazole (PRILOSEC) 40 MG capsule TAKE 1 CAPSULE(40 MG) BY MOUTH DAILY   rosuvastatin (CRESTOR) 10 MG tablet Take 1 tablet (10 mg total) by mouth daily.   solifenacin (VESICARE) 5 MG tablet Take 1 tablet (5 mg total) by mouth daily.   ticagrelor (BRILINTA) 90 MG TABS tablet Take 1 tablet (90 mg total) by mouth 2 (two) times daily.   No facility-administered encounter medications on file as of 07/25/2021.    Review of Systems  Constitutional:  Positive for fatigue. Negative for appetite change, chills, fever and unexpected weight change.  HENT:  Negative for congestion, dental problem, ear  discharge, ear pain, facial swelling, hearing loss, nosebleeds, postnasal drip, rhinorrhea, sinus pressure, sinus pain, sneezing, sore throat, tinnitus and trouble swallowing.   Eyes:  Negative for pain, discharge, redness, itching and visual disturbance.  Respiratory:  Negative for cough, chest tightness, shortness of breath and wheezing.   Cardiovascular:  Negative for chest pain, palpitations and leg swelling.  Gastrointestinal:  Negative for abdominal distention, abdominal pain, blood in stool, constipation, diarrhea, nausea and vomiting.  Musculoskeletal:  Positive for arthralgias, back pain and gait problem. Negative for joint swelling, myalgias, neck pain and neck stiffness.  Skin:  Negative for color change, pallor and rash.  Neurological:  Negative for dizziness, syncope, speech difficulty, weakness, light-headedness, numbness and headaches.  Hematological:  Does not bruise/bleed easily.  Psychiatric/Behavioral:  Positive for sleep disturbance. Negative for agitation, behavioral problems, confusion, hallucinations, self-injury and suicidal ideas. The patient is not nervous/anxious.    Immunization History  Administered Date(s) Administered   Fluad Quad(high Dose 65+) 04/01/2019, 04/19/2021   Influenza Whole 05/14/2011   Influenza, High Dose Seasonal PF 04/30/2017, 05/15/2018, 05/18/2020  PFIZER(Purple Top)SARS-COV-2 Vaccination 08/27/2019, 09/21/2019, 03/07/2020, 11/16/2020   Pneumococcal Conjugate-13 08/30/2018   Pneumococcal Polysaccharide-23 03/21/2009, 04/20/2020   Td 12/04/2009   Zoster, Live 11/29/2009   Pertinent  Health Maintenance Due  Topic Date Due   DEXA SCAN  Never done   MAMMOGRAM  04/16/2023   INFLUENZA VACCINE  Completed   Fall Risk 06/15/2021 06/16/2021 06/16/2021 06/17/2021 07/25/2021  Falls in the past year? - - - 0 0  Was there an injury with Fall? - - - 0 0  Fall Risk Category Calculator - - - 0 0  Fall Risk Category - - - Low Low  Patient Fall Risk Level  Moderate fall risk Moderate fall risk Moderate fall risk Low fall risk Low fall risk  Patient at Risk for Falls Due to - - - No Fall Risks No Fall Risks  Patient at Risk for Falls Due to - - - - -  Fall risk Follow up - - - Falls evaluation completed Falls evaluation completed   Functional Status Survey:    Vitals:   07/25/21 1058  BP: 138/76  Pulse: 73  Resp: 16  Temp: (!) 97 F (36.1 C)  SpO2: 97%  Weight: 175 lb 3.2 oz (79.5 kg)  Height: _0  (1.575 m)   Body mass index is 32.04 kg/m. Physical Exam Vitals reviewed.  Constitutional:      General: She is not in acute distress.    Appearance: Normal appearance. She is normal weight. She is not ill-appearing or diaphoretic.  HENT:     Head: Normocephalic.  Eyes:     General: No scleral icterus.       Right eye: No discharge.        Left eye: No discharge.     Conjunctiva/sclera: Conjunctivae normal.     Pupils: Pupils are equal, round, and reactive to light.  Neck:     Vascular: No carotid bruit.  Cardiovascular:     Rate and Rhythm: Normal rate and regular rhythm.     Pulses: Normal pulses.     Heart sounds: Normal heart sounds. No murmur heard.   No friction rub. No gallop.  Pulmonary:     Effort: Pulmonary effort is normal. No respiratory distress.     Breath sounds: Normal breath sounds. No wheezing, rhonchi or rales.  Chest:     Chest wall: No tenderness.  Abdominal:     General: Bowel sounds are normal. There is no distension.     Palpations: Abdomen is soft. There is no mass.     Tenderness: There is no abdominal tenderness. There is no right CVA tenderness, left CVA tenderness, guarding or rebound.  Musculoskeletal:        General: No swelling or tenderness. Normal range of motion.     Cervical back: Normal range of motion. No rigidity or tenderness.     Right lower leg: No edema.     Left lower leg: No edema.  Lymphadenopathy:     Cervical: No cervical adenopathy.  Skin:    General: Skin is warm and  dry.     Coloration: Skin is not pale.     Findings: No bruising, erythema, lesion or rash.  Neurological:     Mental Status: She is alert and oriented to person, place, and time.     Cranial Nerves: No cranial nerve deficit.     Motor: No weakness.     Coordination: Coordination normal.     Gait: Gait abnormal.  Psychiatric:  Mood and Affect: Mood normal.        Speech: Speech normal.        Behavior: Behavior normal.        Thought Content: Thought content normal.        Judgment: Judgment normal.    Labs reviewed: Recent Labs    06/15/21 0638 06/16/21 0222 07/08/21 0942 07/12/21 1031  NA  --  138 141 138  K  --  4.5 4.3 4.4  CL  --  109 104 102  CO2  --  _0 GLUCOSE  --  91 80 86  BUN  --  _1 CREATININE  --  0.72 0.66 0.68  CALCIUM  --  8.7* 9.5 9.5  MG 1.6* 2.3  --   --   PHOS 3.9 3.9  --   --    Recent Labs    10/02/20 1607 11/19/20 0945 01/07/21 1122 01/25/21 1527 06/16/21 0222  AST 28   < > 21 32 17  ALT 69*   < > 20 50* 16  ALKPHOS 161*  --  113  --  101  BILITOT <0.2   < > 0.5 0.4 0.4  PROT 6.1   < > 6.7 5.7* 5.6*  ALBUMIN 4.2  --  4.5  --  3.4*   < > = values in this interval not displayed.   Recent Labs    01/25/21 1527 06/14/21 1750 06/15/21 0256 06/16/21 0222 07/12/21 0953  WBC 6.9 7.5 5.3 4.7 4.1  NEUTROABS 5,258 5.7  --  2.8  --   HGB 11.4* 13.2 12.0 12.3 13.3  HCT 35.7 39.7 36.3 38.4 39.2  MCV 89.7 85.7 86.6 87.7 87  PLT 303 223 175 190 192   Lab Results  Component Value Date   TSH 4.439 06/15/2021   Lab Results  Component Value Date   HGBA1C 5.4 06/15/2021   Lab Results  Component Value Date   CHOL 213 (H) 06/15/2021   HDL 44 06/15/2021   LDLCALC 142 (H) 06/15/2021   LDLDIRECT 130.2 11/03/2011   TRIG 133 06/15/2021   CHOLHDL 4.8 06/15/2021    Significant Diagnostic Results in last 30 days:  CARDIAC CATHETERIZATION  Result Date: 07/18/2021   Mid Cx lesion is 25% stenosed.   Prox LAD to Mid LAD  lesion is 25% stenosed.   Prox RCA to Mid RCA lesion is 99% stenosed.  Left-to-right collaterals.   A drug-eluting stent was successfully placed using a STENT ONYX FRONTIER 3.0X30, postdilated to 3.5 mm.   Post intervention, there is a 0% residual stenosis.   The left ventricular systolic function is normal.   LV end diastolic pressure is normal.   The left ventricular ejection fraction is 55-65% by visual estimate.   There is no aortic valve stenosis. Successful PCI of the mid RCA.  Brisk left to right collaterals.  It is likely that she had a ruptured plaque in November and developed collaterals since that time.  Continue aggressive secondary prevention.  Plavix was used for antiplatelet therapy in the Cath Lab.  We will need to see with her other multiple sclerosis medicines, if Brilinta may be a better choice due to drug interactions.  We will also have to check on which statin would not cause drug interaction. Of note, she had significant right radial vasospasm.  This required additional verapamil and nitroglycerin in the radial artery.  We also used a heating pad on her right forearm.  She  required 4 mg of Versed and 100 mg of fentanyl for sedation and this also seemed to help with the vasospasm. Addendum: After review with the Pharm.D., Brilinta would be a better choice than clopidogrel.  We will send her home with a supply of Brilinta after 180 mg loading dose.  CT CORONARY MORPH W/CTA COR W/SCORE W/CA W/CM &/OR WO/CM  Addendum Date: 07/19/2021   ADDENDUM REPORT: 07/19/2021 09:28 EXAM: OVER-READ INTERPRETATION  CT CHEST The following report is an over-read performed by radiologist Dr. Barnetta Hammersmith [facility] on 07/19/2021. This over-read does not include interpretation of cardiac or coronary anatomy or pathology. The coronary CTA interpretation by the cardiologist is attached. COMPARISON:  None. FINDINGS: Atherosclerosis of the thoracic aorta. Visualized mediastinum and hilar regions  demonstrate no lymphadenopathy or masses. There is a small hiatal hernia. Visualized lungs show no evidence of pulmonary edema, consolidation, pneumothorax, nodule or pleural fluid. Visualized upper abdomen and bony structures are unremarkable. IMPRESSION: 1. Atherosclerosis of the thoracic aorta. 2. Small hiatal hernia. Electronically Signed   By: Aletta Edouard M.D.   On: 07/19/2021 09:28   Result Date: 07/19/2021 CLINICAL DATA:  76 Year old White Female EXAM: Cardiac/Coronary  CTA TECHNIQUE: The patient was scanned on a [employer]. FINDINGS: Scan was triggered in the descending thoracic aorta. Axial non-contrast 3 mm slices were carried out through the heart. The data set was analyzed on a dedicated work station and scored using the [employer]. Gantry rotation speed was 250 msecs and collimation was .6 mm. 0.8 mg of sl NTG was given. The 3D data set was reconstructed in 5% intervals of the 67-82 % of the R-R cycle. Diastolic phases were analyzed on a dedicated work station using MPR, MIP and VRT modes. The patient received 95 cc of contrast. Aorta: Normal size. Scattered aortic calcifications. No dissection. Main Pulmonary Artery: Normal size of the pulmonary artery. Aortic Valve:  Tri-leaflet.  Aortic Valve calcium score 664. Coronary Arteries:  Normal coronary origin.  Right dominance. Coronary Calcium Score: Left main: 12 Left anterior descending artery: 98 Left circumflex artery: 24 Right coronary artery: 7 Total: 141 Percentile: 71st for age, sex, and race matched control. RCA is a large dominant artery that gives rise to PDA and PLA. There is a severe soft plaque stenosis on the proximal vessel. Left main is a large artery that gives rise to LAD and LCX arteries. Minimal non obstructive distal calcified plaque LAD is a large vessel that gives rise to one large D1 Branch. There is a moderate stenosis mixed plaque in the proximal vessel. LCX is a non-dominant artery. Mild non obstructive  plaque in the proximal vessel. Other findings: Normal variant pulmonary vein drainage into the left atrium- presence of left middle pulmonary vein. Normal left atrial appendage without a thrombus. Extra-cardiac findings: See attached radiology report for non-cardiac structures. IMPRESSION: 1. Coronary calcium score of 141. This was 71st percentile for age, sex, and race matched control. 2. Normal coronary origin with right dominance. 3. CAD-RADS 4 Severe stenosis. (70-99% or > 50% left main). Cardiac catheterization or CT FFR is recommended. Consider symptom-guided anti-ischemic pharmacotherapy as well as risk factor modification per guideline directed care. RECOMMENDATIONS: Coronary artery calcium (CAC) score is a strong predictor of incident coronary heart disease (CHD) and provides predictive information beyond traditional risk factors. CAC scoring is reasonable to use in the decision to withhold, postpone, or initiate statin therapy in intermediate-risk or selected borderline-risk asymptomatic adults (age 68-75 years and LDL-C >=  70 to <190 mg/dL) who do not have diabetes or established atherosclerotic cardiovascular disease (ASCVD).* In intermediate-risk (10-year ASCVD risk >=7.5% to <20%) adults or selected borderline-risk (10-year ASCVD risk >=5% to <7.5%) adults in whom a CAC score is measured for the purpose of making a treatment decision the following recommendations have been made: If CAC = 0, it is reasonable to withhold statin therapy and reassess in 5 to 10 years, as long as higher risk conditions are absent (diabetes mellitus, family history of premature CHD in first degree relatives (males <55 years; females <65 years), cigarette smoking, LDL >=190 mg/dL or other independent risk factors). If CAC is 1 to 99, it is reasonable to initiate statin therapy for patients >=48 years of age. If CAC is >=100 or >=75th percentile, it is reasonable to initiate statin therapy at any age. Cardiology referral should  be considered for patients with CAC scores =400 or >=75th percentile. *2018 AHA/ACC/AACVPR/AAPA/ABC/ACPM/ADA/AGS/APhA/ASPC/NLA/PCNA Guideline on the Management of Blood Cholesterol: A Report of the American College of Cardiology/American Heart Association Task Force on Clinical Practice Guidelines. J Am Coll Cardiol. 2019;73(24):3168-3209. Rudean Haskell, MD Electronically Signed: By: Rudean Haskell M.D. On: 07/09/2021 14:57   CT CORONARY FRACTIONAL FLOW RESERVE DATA PREP  Result Date: 07/23/2021 EXAM: CT-FFR ANALYSIS CLINICAL DATA:  Possibly obstructive coronary lesion (LAD, RCA) FINDINGS: CT-FFR analysis was performed on the original cardiac CT angiogram dataset. Diagrammatic representation of the CT-FFR analysis is provided in a separate PDF document in PACS. This dictation was created using the PDF document and an interactive 3D model of the results. 3D model is not available in the EMR/PACS. Normal FFR range is >0.80. 1. Left Main: No significant functional stenosis, CT-FFR 0.99. 2. LAD: No significant functional stenosis, CT-FFR 0.96 to 0.91 at LAD lesion. 3. LCX: No significant functional stenosis, CT-FFR 0.89 distal vessel. 4. RCA: significant functional stenosis, CT-FFR 0.54. IMPRESSION: 1. CT FFR analysis shows evidence of significant functional stenosis- RCA. Rudean Haskell MD Electronically Signed   By: Rudean Haskell M.D.   On: 07/09/2021 15:02   CT CORONARY FRACTIONAL FLOW RESERVE FLUID ANALYSIS  Result Date: 07/23/2021 EXAM: CT-FFR ANALYSIS CLINICAL DATA:  Possibly obstructive coronary lesion (LAD, RCA) FINDINGS: CT-FFR analysis was performed on the original cardiac CT angiogram dataset. Diagrammatic representation of the CT-FFR analysis is provided in a separate PDF document in PACS. This dictation was created using the PDF document and an interactive 3D model of the results. 3D model is not available in the EMR/PACS. Normal FFR range is >0.80. 1. Left Main: No  significant functional stenosis, CT-FFR 0.99. 2. LAD: No significant functional stenosis, CT-FFR 0.96 to 0.91 at LAD lesion. 3. LCX: No significant functional stenosis, CT-FFR 0.89 distal vessel. 4. RCA: significant functional stenosis, CT-FFR 0.54. IMPRESSION: 1. CT FFR analysis shows evidence of significant functional stenosis- RCA. Rudean Haskell MD Electronically Signed   By: Rudean Haskell M.D.   On: 07/09/2021 15:02    Assessment/Plan  1. Essential hypertension B/p well controlled  - continue on carvedilol,hydralazine,losartan  - CBC with Differential/Platelet - BMP with eGFR([facility])  2. Mixed hyperlipidemia LDL not at gaol  Continue on Rosuvastatin   3. Coronary artery disease involving native coronary artery of native heart without angina pectoris Status post hospitalization for chest pain.Has improved. - continue Nitro PRN  - continue to control high risk factors  - follow up with Cardiologist as schedule   4. Chronic diastolic congestive heart failure (HCC) No signs of fluid overload  -  Keep legs elevated when seated to keep swelling down  - check weight at least three times per week and notify provider for any abrupt weight gain > 3 lbs  - Reduce salt intake in diet  - CBC with Differential/Platelet - BMP with eGFR([facility])  5. Chronic bilateral low back pain without sciatica Continue current pain regimen   6. Chronic fatigue Chronic  Possible multifactorial given cardiac issues and MS  - continue to monitor  7. MULTIPLE SCLEROSIS, PROGRESSIVE/RELAPSING Continue to follow up with Neurologist   Family/ staff Communication: Reviewed plan of care with patient verbalized understanding   Labs/tests ordered:  - CBC with Differential/Platelet - CMP with eGFR([facility])  Next Appointment: has appointment in place already   Sandrea Hughs, NP

## 2021-07-29 ENCOUNTER — Ambulatory Visit (HOSPITAL_BASED_OUTPATIENT_CLINIC_OR_DEPARTMENT_OTHER): Payer: Medicare Other | Admitting: Physical Therapy

## 2021-07-29 ENCOUNTER — Other Ambulatory Visit (HOSPITAL_COMMUNITY): Payer: Self-pay

## 2021-07-29 ENCOUNTER — Telehealth (HOSPITAL_COMMUNITY): Payer: Self-pay | Admitting: Pharmacist

## 2021-07-29 NOTE — Telephone Encounter (Signed)
Pharmacy Transitions of Care Follow-up Telephone Call  Date of discharge: 07/18/21  Discharge Diagnosis: cath with DES placed  How have you been since you were released from the hospital?  Overall well, pt had questions about purpose of new meds  Medication changes made at discharge:  - START:  Brilinta (ticagrelor)  rosuvastatin (CRESTOR)   - STOPPED: n/a  - CHANGED: n/a  Medication changes verified by the patient? Yes    Medication Accessibility:  Home Pharmacy: [facility]   Was the patient provided with refills on discharged medications? yes   Have all prescriptions been transferred from [facility] to home pharmacy?  Not transferred at this time as patient does not know where she wants refills next time. Interested in using a [facility]  Is the patient able to afford medications? AARP part D with deductible Notable copays: Brilinta - >$400/mo until deductible met Eligible patient assistance: Pt will speak to cardiologist about an alternative or MAP    Medication Review:   TICAGRELOR (BRILINTA) Ticagrelor 90 mg BID.  - Educated patient on expected duration of therapy of aspirin 26m with ticagrelor for at least 12 months.  - Discussed importance of taking medication around the same time every day, - Reviewed potential DDIs with patient - Advised patient of medications to avoid (NSAIDs, aspirin maintenance doses>100 mg daily) - Educated that Tylenol (acetaminophen) will be the preferred analgesic to prevent risk of bleeding  - Emphasized importance of monitoring for signs and symptoms of bleeding (abnormal bruising, prolonged bleeding, nose bleeds, bleeding from gums, discolored urine, black tarry stools)  - Educated patient to notify doctor if shortness of breath or abnormal heartbeat occur - Advised patient to alert all providers of antiplatelet therapy prior to starting a new medication or having a procedure   Pt does report some  intermittent SOB, I have asked her to speak with her cardiologist about the possibility of  Brilinta contributing.   Follow-up Appointments:  PCP Hospital f/u appt confirmed?  Recent visit last week  Specialist Hospital f/u appt confirmed?  Scheduled to see Dr. MPhineas Inches cardio, on 08/02/21   If their condition worsens, is the pt aware to call PCP or go to the Emergency Dept.? yes  Final Patient Assessment: I have provided extensive education about the purpose of and possible side effects of her medications.  She had questions regarding some of her other medications interracting, etc.  We also discussed use of NSAIDs as pt occasionally uses these due to MS diagnoses.  Pt was very appreciative of the call.

## 2021-07-30 ENCOUNTER — Telehealth: Payer: Self-pay | Admitting: Internal Medicine

## 2021-07-30 NOTE — Telephone Encounter (Signed)
Returned call to pt, informed pt to take with coke and call back if this does not work. Informed her not diet coke. Verbalized understanding she will CB if this does not work

## 2021-07-30 NOTE — Telephone Encounter (Signed)
°  Pt c/o medication issue:  1. Name of Medication:   ticagrelor (BRILINTA) 90 MG TABS tablet    2. How are you currently taking this medication (dosage and times per day)? Take 1 tablet (90 mg total) by mouth 2 (two) times daily.  3. Are you having a reaction (difficulty breathing--STAT)?   4. What is your medication issue? Pt said, when started to get SOB when she start taking Brilinta. She said its getting worst now, she gets SOB even when she just sitting

## 2021-08-01 ENCOUNTER — Ambulatory Visit (INDEPENDENT_AMBULATORY_CARE_PROVIDER_SITE_OTHER): Payer: Medicare Other | Admitting: Neurology

## 2021-08-01 ENCOUNTER — Encounter: Payer: Self-pay | Admitting: Neurology

## 2021-08-01 VITALS — BP 112/64 | HR 72 | Ht 62.0 in | Wt 175.5 lb

## 2021-08-01 DIAGNOSIS — G35 Multiple sclerosis: Secondary | ICD-10-CM | POA: Diagnosis not present

## 2021-08-01 DIAGNOSIS — I2510 Atherosclerotic heart disease of native coronary artery without angina pectoris: Secondary | ICD-10-CM

## 2021-08-01 DIAGNOSIS — Z79899 Other long term (current) drug therapy: Secondary | ICD-10-CM | POA: Diagnosis not present

## 2021-08-01 DIAGNOSIS — R269 Unspecified abnormalities of gait and mobility: Secondary | ICD-10-CM | POA: Diagnosis not present

## 2021-08-01 DIAGNOSIS — R5382 Chronic fatigue, unspecified: Secondary | ICD-10-CM | POA: Diagnosis not present

## 2021-08-01 DIAGNOSIS — M2550 Pain in unspecified joint: Secondary | ICD-10-CM | POA: Diagnosis not present

## 2021-08-01 MED ORDER — LAMOTRIGINE 25 MG PO TABS: ORAL_TABLET | ORAL | 5 refills | Status: DC

## 2021-08-01 NOTE — Progress Notes (Signed)
[facility]  PATIENT: Dawn [name] DOB: 05/04/1946  REFERRING CLINICIAN: Aura Dials HISTORY FROM: Patient REASON FOR VISIT: MS   HISTORICAL  CHIEF COMPLAINT:  Chief Complaint  Patient presents with   Follow-up    Rm 2, alone. Here to f/u for MS, on Leflunomide and doing well. Pt reports pn has been back. Bowel, bladder, off/on vertigo. Has had hip replacement, dx with heart failure and having a stent placed.     HISTORY OF PRESENT ILLNESS:  Dawn [name] is a 76 y.o. woman who was diagnosed with multiple sclerosis in 2001.      Update 1/122023: Her MS is stable without exacerbations.  She is on Leflunomide [employer] had not covered [place] well for her in the past.).  She tolerates it well.    She had right THR last year for a lbral tear and had some heart issues - CHF.  She had a stent placed.  The procedure went well.    She is on Brilinta now.    She has LBP.  A MBB helped and she may have RFA.     PT with needling has helped.   Due to reduced ability to stand due to pain, she stopped [place] but is still doing swimming at the [facility].     Her gait is reduced.  She feels balance is poor and also has pain which affects gait.   She feels lightheaded.   Many med's ere changed by cardiology.     She uses a cane due to the right leg giving out at times.   She is noting numbness but less pain in her hands.  At night, she gets painful tingling in her legs.    She has some right hand pain and does some exercises with benefit.     She has pain in her feet  She saw  Dr. Estanislado Pandy for her arthritis and was diagnosed with Sjogren's (postive SSA).  She has a dry mouth.  Tramadol had helped her pain in the past but she rarely takes as it makes her sleepy.       She is sleeping poorly with tingling and cramps in legs and also have hot flashes.   Clonazepam helps insomnia someand spasticity at night as much as it used to.     She feels her cognition is about  the same as last visit.      She has had more memory difficulties and is forgetful.     She denies anxiety or depression at this time.   She has fatigue and takes 1 Provigil every morning and sometimes another 1/2 pill in the afternoon.   She generally eats well with a Mediterranean style diet.       _______________________________________________ MS History:    In 1994, she had an episode of severe vertigo with gait ataxia. An MRI at that time was reportedly normal. The symptoms were felt to be due to allergies. About 7 years later she had vertigo, gait ataxia and diplopia. Also her left leg was giving out. Short after that she had a hysterectomy. Postoperatively, she was numb in both legs and she had a lumbar puncture which showed changes consistent with MS. Then, an MRI of the brain was performed showing changes consistent with MS. She was referred to Dr. Erling Cruz who her on Betaseron. Last year, she switched from Betaseron to Tecfidera, in the hope that she would be able to avoid shots as she was having  severe skin reactions with knots.    However, she had a lot of difficulty tolerating Tecfidera and swithced to [place] in early 2016 and to [place] late 2016.  Switched to Leflunomide for insurance reasons in 2019.    MRI brain 09/13/2018 shows multiple T2/flair hyperintense foci in the hemispheres.  The pattern is consistent with chronic demyelinating plaque associated with multiple sclerosis though some foci likely also represent chronic microvascular ischemic change.  None of the foci enhance or appear to be acute and there is no change compared to the 2017 MRI.      Remote left cerebellar infarction.      There is a normal enhancement pattern and no acute findings.  REVIEW OF SYSTEMS:  Constitutional: No fevers, chills, sweats, or change in appetite.   Reports fatigue Eyes: No visual changes, double vision, eye pain Ear, nose and throat: No hearing loss, ear pain, nasal congestion, sore  throat Cardiovascular: No chest pain, palpitations Respiratory:  No shortness of breath at rest or with exertion.   No wheezes GastrointestinaI: No nausea, vomiting, diarrhea, abdominal pain, fecal incontinence Genitourinary:  see above. Musculoskeletal:  Pain in left greater than right hip Integumentary: No rash, pruritus.    Neurological: as above Psychiatric: No depression at this time.  No anxiety Endocrine: No palpitations, diaphoresis, change in appetite, change in weigh or increased thirst Hematologic/Lymphatic:  No anemia, purpura, petechiae. Allergic/Immunologic: No itchy/runny eyes, nasal congestion, recent allergic reactions, rashes  ALLERGIES: Allergies  Allergen Reactions   Statins Other (See Comments)    Muscle pain    HOME MEDICATIONS: Outpatient Medications Prior to Visit  Medication Sig Dispense Refill   aspirin EC 81 MG tablet Take 81 mg by mouth daily. Swallow whole.     carvedilol (COREG) 6.25 MG tablet Take 1 tablet (6.25 mg total) by mouth 2 (two) times daily with a meal. 180 tablet 3   cholecalciferol (VITAMIN D) 25 MCG (1000 UNIT) tablet Take 2,000 Units by mouth daily.     clonazePAM (KLONOPIN) 0.5 MG tablet TAKE 1 TABLET(0.5 MG) BY MOUTH AT BEDTIME 90 tablet 1   fexofenadine (ALLEGRA) 180 MG tablet Take 180 mg by mouth at bedtime.      Flaxseed, Linseed, (FLAXSEED OIL PO) Take 5-10 mLs by mouth 3 (three) times a week. Power     hydrALAZINE (APRESOLINE) 10 MG tablet Take 1 tablet (10 mg total) by mouth every 8 (eight) hours. 120 tablet 3   leflunomide (ARAVA) 20 MG tablet Take 1 tablet (20 mg total) by mouth daily. 90 tablet 4   Lidocaine 4 % PTCH Place 1 patch onto the skin daily as needed (mild pain). Remove & Discard patch within 12 hours or as directed by MD     losartan (COZAAR) 100 MG tablet TAKE 1 TABLET(100 MG) BY MOUTH DAILY 90 tablet 1   melatonin 5 MG TABS Take 5 mg by mouth at bedtime as needed.     modafinil (PROVIGIL) 200 MG tablet Take 1  tablet (200 mg total) by mouth daily. 90 tablet 0   nitroGLYCERIN (NITROSTAT) 0.4 MG SL tablet Place 1 tablet (0.4 mg total) under the tongue every 5 (five) minutes as needed for chest pain. 30 tablet 0   omeprazole (PRILOSEC) 40 MG capsule TAKE 1 CAPSULE(40 MG) BY MOUTH DAILY 90 capsule 1   rosuvastatin (CRESTOR) 10 MG tablet Take 1 tablet (10 mg total) by mouth daily. 30 tablet 2   solifenacin (VESICARE) 5 MG tablet Take 1 tablet (5 mg  total) by mouth daily. 90 tablet 3   ticagrelor (BRILINTA) 90 MG TABS tablet Take 1 tablet (90 mg total) by mouth 2 (two) times daily. 60 tablet 5   No facility-administered medications prior to visit.    PAST MEDICAL HISTORY: Past Medical History:  Diagnosis Date   Allergy    Dry eyes    Endometrial polyp    Essential hypertension 08/25/2018   GERD (gastroesophageal reflux disease)    Heart failure (HCC)    History of hiatal hernia    History of kidney stones    Hot flashes, menopausal 10/13/2011   Estradiol started    Jaundice as teenager   no problems since   Memory loss 09/21/2019     1/2 of feet numb all the time   Multiple sclerosis ([place]) dx 2001   Neuropathy    Neuropathy    bilateral feet   Osteoarthritis    Sjogren's syndrome ([place]) dx oct 2021   sore muscvles, dry mouth and eyes   Stroke ([place])    Vertigo    Vision abnormalities     PAST SURGICAL HISTORY: Past Surgical History:  Procedure Laterality Date   ABDOMINAL HYSTERECTOMY  2002   partial   colonscopy  2011   CORONARY STENT INTERVENTION N/A 07/18/2021   Procedure: CORONARY STENT INTERVENTION;  Surgeon: Jettie Booze, MD;  Location: [facility];  Service: Cardiovascular;  Laterality: N/A;   DILATATION & CURETTAGE/HYSTEROSCOPY WITH MYOSURE N/A 06/08/2020   Procedure: DILATATION & CURETTAGE/HYSTEROSCOPY/Polypectomy WITH MYOSURE;  Surgeon: Armandina Stammer, DO;  Location: [facility];  Service: Gynecology;  Laterality: N/A;   LEFT HEART CATH AND  CORONARY ANGIOGRAPHY N/A 07/18/2021   Procedure: LEFT HEART CATH AND CORONARY ANGIOGRAPHY;  Surgeon: Jettie Booze, MD;  Location: [facility];  Service: Cardiovascular;  Laterality: N/A;   LUMBAR FUSION  2001   TOTAL HIP ARTHROPLASTY Right 01/17/2021   Procedure: TOTAL HIP ARTHROPLASTY ANTERIOR APPROACH;  Surgeon: Rod Can, MD;  Location: [facility];  Service: Orthopedics;  Laterality: Right;   UPPER GI ENDOSCOPY  yrs ago    FAMILY HISTORY: Family History  Problem Relation Age of Onset   Heart failure Mother    Heart failure Father    Arthritis Other    Hypertension Other     SOCIAL HISTORY:  Social History   Socioeconomic History   Marital status: Widowed    Spouse name: Not on file   Number of children: Not on file   Years of education: Not on file   Highest education level: Not on file  Occupational History   Occupation: retired  Tobacco Use   Smoking status: Former    Packs/day: 0.50    Years: 15.00    Pack years: 7.50    Types: Cigarettes    Quit date: 08/05/1983    Years since quitting: 38.0   Smokeless tobacco: Never  Vaping Use   Vaping Use: Never used  Substance and Sexual Activity   Alcohol use: Yes    Alcohol/week: 0.0 standard drinks    Comment: rarely   Drug use: No   Sexual activity: Not Currently    Birth control/protection: Post-menopausal  Other Topics Concern   Not on file  Social History Narrative   Regular Exercise-no   Widowed   Retired   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     Tobacco use, amount per day now: former   Past tobacco use, amount per day: 1/2-1 packet   How many years did you use tobacco: 12 years   Alcohol use (drinks per week): 0   Diet: mediterranean based   Do you drink/eat things with caffeine: yes   Marital status:        widow                           What year were you married? 1987   Do you live in a house, apartment, assisted living, condo, trailer, etc.? house   Is it one or more stories? 1 story   How many persons live in your home? 1   Do you have pets in your home?( please list) no   Current or past profession: Science writer    Do you exercise?          yes                        Type and how often? Tai chi and pt therapy, stretches etc....   Do you have a living will? yes   Do you have a DNR form?      no                             If not, do you want to discuss one? yes   Do you have signed POA/HPOA forms?      no                  If so, please bring to you appointment   Social Determinants of Health   Financial Resource Strain: Not on file  Food Insecurity: Not on file  Transportation Needs: Not on file  Physical Activity: Not on file  Stress: Not on file  Social Connections: Not on file  Intimate Partner Violence: Not on file     PHYSICAL EXAM  Vitals:   08/01/21 1010  BP: 112/64  Pulse: 72  SpO2: 96%  Weight: 175 lb 8 oz (79.6 kg)  Height: '5\' 2"'  (1.575 m)    Body mass index is 32.1 kg/m.   General: The patient is well-developed and well-nourished and in no acute distress  Musculoskeletal: She has right > left  trochanteric bursa tenderness and piriformis tenderness..     Neurologic Exam  Mental status: The patient is alert and oriented x 3 at the time of the examination.   apparently normal attention span, stm and concentration ability.   Speech is normal.      Cranial nerves: Extraocular movements are full.  Facial strength and sensory are normal.  Hearing appeared to be symmetric  Motor:  Muscle bulk is normal but tone is symmetric, more on the left.. Strength is 4/5 in median innervated and 4+/5 and ulnar innervated intrinsic hand muscles   Sensory:   Sensory testing is intact to touch and vibration in the arms.  She has reduced vibration sensation in the feet.  Coordination:  Finger-nose-finger and heel-to-shin is performed better on the right than the left.  Gait and station: Station is normal.  Gait is wide.  Tandem gait is poor.  Romberg  Reflexes: Deep tendon reflexes are symmetric and normal bilaterally.      DIAGNOSTIC DATA (  LABS, IMAGING, TESTING) - I reviewed patient records, labs, notes, testing and imaging myself where available.  Lab Results  Component Value Date   WBC 4.4 07/25/2021   HGB 13.5 07/25/2021   HCT 41.1 07/25/2021   MCV 88.2 07/25/2021   PLT 241 07/25/2021      Component Value Date/Time   NA 138 07/25/2021 1135   NA 138 07/12/2021 1031   K 4.5 07/25/2021 1135   CL 104 07/25/2021 1135   CO2 26 07/25/2021 1135   GLUCOSE 88 07/25/2021 1135   BUN 17 07/25/2021 1135   BUN 16 07/12/2021 1031   CREATININE 0.67 07/25/2021 1135   CALCIUM 9.9 07/25/2021 1135   PROT 5.6 (L) 06/16/2021 0222   PROT 6.1 10/02/2020 1607   ALBUMIN 3.4 (L) 06/16/2021 0222   ALBUMIN 4.2 10/02/2020 1607   AST 17 06/16/2021 0222   AST 20 03/09/2018 1342   ALT 16 06/16/2021 0222   ALT 22 03/09/2018 1342   ALKPHOS 101 06/16/2021 0222   BILITOT 0.4 06/16/2021 0222   BILITOT <0.2 10/02/2020 1607   BILITOT 0.4 03/09/2018 1342   GFRNONAA >60 06/16/2021 0222   GFRNONAA 91 01/25/2021 1527   GFRAA 105 01/25/2021 1527   Lab Results  Component Value Date   CHOL 213 (H) 06/15/2021   HDL 44 06/15/2021   LDLCALC 142 (H) 06/15/2021   LDLDIRECT 130.2 11/03/2011   TRIG 133 06/15/2021   CHOLHDL 4.8 06/15/2021   Lab Results  Component Value Date   HGBA1C 5.4 06/15/2021   No results found for: VITAMINB12 Lab Results  Component Value Date   TSH 4.439 06/15/2021       ASSESSMENT AND PLAN  Multiple sclerosis ([place]) - Plan: CBC with Differential/Platelet  High risk medication use - Plan: CBC with Differential/Platelet  Chronic fatigue  Multiple joint pain  Abnormality of gait   1.    She will continue leflunomide for MS.   Recent labs showed  low lymphocytes, we will recheck.   She has been on Arava teriflunomide on leflunomide for many years still I think it would be unlikely that this is coming from the leflunomide. 2.    Continue exercises and can renew physical therapy.   3.    Continue  clonazepam and modafinil.    Ad lamotrigine for neuropathy.   5.    Return to see me in about 6 months but is advised to call sooner if she has new or worsening neurologic symptoms.   Ayvin Lipinski A. Felecia Shelling, MD, PhD 1/32/4401, 02:72 AM Certified in Neurology, Clinical Neurophysiology, Sleep Medicine, Pain Medicine and Neuroimaging  [facility] [address] [place], [place] [address] [phone]-' \

## 2021-08-01 NOTE — Patient Instructions (Signed)
Take one pill daily x 1 week,  then one pill twice daily x 1 week,  then one pill three times daily x 1 week,  then 2 pills twice daily

## 2021-08-02 ENCOUNTER — Ambulatory Visit (INDEPENDENT_AMBULATORY_CARE_PROVIDER_SITE_OTHER): Payer: Medicare Other | Admitting: Internal Medicine

## 2021-08-02 ENCOUNTER — Encounter: Payer: Self-pay | Admitting: Internal Medicine

## 2021-08-02 ENCOUNTER — Other Ambulatory Visit: Payer: Self-pay

## 2021-08-02 ENCOUNTER — Telehealth (HOSPITAL_COMMUNITY): Payer: Self-pay

## 2021-08-02 VITALS — BP 138/70 | HR 71 | Resp 20 | Ht 62.0 in | Wt 175.0 lb

## 2021-08-02 DIAGNOSIS — E7800 Pure hypercholesterolemia, unspecified: Secondary | ICD-10-CM

## 2021-08-02 LAB — CBC WITH DIFFERENTIAL/PLATELET
Basophils Absolute: 0.1 10*3/uL (ref 0.0–0.2)
Basos: 1 %
EOS (ABSOLUTE): 0.2 10*3/uL (ref 0.0–0.4)
Eos: 3 %
Hematocrit: 38 % (ref 34.0–46.6)
Hemoglobin: 13 g/dL (ref 11.1–15.9)
Immature Grans (Abs): 0 10*3/uL (ref 0.0–0.1)
Immature Granulocytes: 0 %
Lymphocytes Absolute: 0.7 10*3/uL (ref 0.7–3.1)
Lymphs: 10 %
MCH: 29.3 pg (ref 26.6–33.0)
MCHC: 34.2 g/dL (ref 31.5–35.7)
MCV: 86 fL (ref 79–97)
Monocytes Absolute: 0.6 10*3/uL (ref 0.1–0.9)
Monocytes: 9 %
Neutrophils Absolute: 5.1 10*3/uL (ref 1.4–7.0)
Neutrophils: 77 %
Platelets: 261 10*3/uL (ref 150–450)
RBC: 4.44 x10E6/uL (ref 3.77–5.28)
RDW: 14.4 % (ref 11.7–15.4)
WBC: 6.7 10*3/uL (ref 3.4–10.8)

## 2021-08-02 NOTE — Telephone Encounter (Signed)
Pt stated that she is in rehab for her MS and that she will stick with them since they do know her limits. I advised pt that I will close her referral for cardiac rehab and she is able to contact us within a year from her event if anything changes. Closed referral

## 2021-08-02 NOTE — Patient Instructions (Signed)
Medication Instructions:  No Changes In Medications at this time.  *If you need a refill on your cardiac medications before your next appointment, please call your pharmacy*  Lab Work: Please return for Blood Work in [place]. No appointment needed, lab here at the office is open Monday-Friday from 8AM to 4PM and closed daily for lunch from 12:45-1:45.   If you have labs (blood work) drawn today and your tests are completely normal, you will receive your results only by: [place] (if you have MyChart) OR A paper copy in the mail If you have any lab test that is abnormal or we need to change your treatment, we will call you to review the results.  Follow-Up: At [facility], you and your health needs are our priority.  As part of our continuing mission to provide you with exceptional heart care, we have created designated Provider Care Teams.  These Care Teams include your primary Cardiologist (physician) and Advanced Practice Providers (APPs -  Physician Assistants and Nurse Practitioners) who all work together to provide you with the care you need, when you need it.  Your next appointment:   6 month(s)  The format for your next appointment:   In Person  Provider:   Janina Mayo, MD

## 2021-08-02 NOTE — Progress Notes (Signed)
Cardiology Office Note:    Date:  08/02/2021   ID:  Dawn [name], DOB 11/03/1945, MRN [id number]  PCP:  Sandrea Hughs, NP   [facility] Providers Cardiologist:  Janina Mayo, MD     Referring MD: Sandrea Hughs, NP   No chief complaint on file. Hospital Follow Up  History of Present Illness:    Dawn [name] is a 76 y.o. female with a hx  below, CVA on aspirin, HLD,  MS, hx referral for hospitalization with NSTEMI, hypertensive emergency  She got tight around her chest. She was short of breath. She had arm cramping. Her symptoms were abrupt. It was later in the day and she was doing activity. She has MS and felt an "MS hug" which can be a tight feeling in her chest. Prior to that she she had no orthopnea, PND, or LE edema.  She reported her blood pressure was high and it was SBP 210 when she checked it. She went to urgent care. She went to the ED. In the ED she reported above. She had EKG with no ischemic changes. Her troponin was below. She had systolic Bps 244W-102V.  She and an echo with normal LV function, mild LVH, no valve dx. RA pressure of 3. Don't see a BNP. Her chest xray was unremarkable. She was hypertensive and her coreg was increased and hydralazine was added. Admission sounds like hypertensive emergency. She was thought to have diastolic CHF with "new onset grade I diastolic dysfunction".  06/15/2021 Trop 182-->322->281->365->387->322  EKG 06/18/2021-NSR   Today, she feels light headed and tired. She hasn't had pain. When she does activity she feels palpitations. No chest pressure with activity. No shortness of breath with activity. No hospital admission for heart failure in the past. She smoked for 11 years and quit in 1987. No stress test and no LHC.  Her father died of MI at 44. Her brother died of heart attack at 80. Sister and grandmother had rheumatoid arthritis. She was diagnosed with MS in 2001 and it is progressive.  She was on zetia but stopped  b/c of muscle aches. She was on a statin years ago. She had full body cramping.   06/15/2021 LDL 142 HDL 44 TC 213 A1c 5.4 Crt 0.7 TSH 4.4   Interim Hx: [name] underwent CTA  that showed a significant functional lesion in the RCA. She underwent LHC that showed prox to mid 99% RCA lesion with L-R collaterals. She underwent successful PCI. She was started on aspirin and brilintia. She feels tired. She did feel some shortness of breath after starting the brillinta. Her chest tightness has resolved. Her right radial site is healed. She feels some light headedness but HR and BP are fine. She is on the [place] diet.   Cardiology Studies TTE 06/15/2021 1. Left ventricular ejection fraction, by estimation, is 60 to 65%. The  left ventricle has normal function. The left ventricle has no regional  wall motion abnormalities. There is mild left ventricular hypertrophy.  Left ventricular diastolic parameters  are consistent with Grade I diastolic dysfunction (impaired relaxation).   2. Right ventricular systolic function is normal. The right ventricular  size is normal. Tricuspid regurgitation signal is inadequate for assessing  PA pressure.   3. The mitral valve is grossly normal. Mild mitral valve regurgitation.  No evidence of mitral stenosis.   4. The aortic valve is grossly normal. There is mild calcification of the  aortic valve. Aortic valve regurgitation  is not visualized. No aortic  stenosis is present.   5. The inferior vena cava is normal in size with greater than 50%  respiratory variability, suggesting right atrial pressure of 3 mmHg.   LHC 06/28/2021    Mid Cx lesion is 25% stenosed.   Prox LAD to Mid LAD lesion is 25% stenosed.   Prox RCA to Mid RCA lesion is 99% stenosed.  Left-to-right collaterals.   A drug-eluting stent was successfully placed using a STENT ONYX FRONTIER 3.0X30, postdilated to 3.5 mm.   Post intervention, there is a 0% residual stenosis.   The  left ventricular systolic function is normal.   LV end diastolic pressure is normal.   The left ventricular ejection fraction is 55-65% by visual estimate.   There is no aortic valve stenosis.  Past Medical History:  Diagnosis Date   Allergy    Dry eyes    Endometrial polyp    Essential hypertension 08/25/2018   GERD (gastroesophageal reflux disease)    Heart failure (HCC)    History of hiatal hernia    History of kidney stones    Hot flashes, menopausal 10/13/2011   Estradiol started    Jaundice as teenager   no problems since   Memory loss 09/21/2019     1/2 of feet numb all the time   Multiple sclerosis ([place]) dx 2001   Neuropathy    Neuropathy    bilateral feet   Osteoarthritis    Sjogren's syndrome ([place]) dx oct 2021   sore muscvles, dry mouth and eyes   Stroke [facility])    Vertigo    Vision abnormalities     Past Surgical History:  Procedure Laterality Date   ABDOMINAL HYSTERECTOMY  2002   partial   colonscopy  2011   CORONARY STENT INTERVENTION N/A 07/18/2021   Procedure: CORONARY STENT INTERVENTION;  Surgeon: Jettie Booze, MD;  Location: [facility];  Service: Cardiovascular;  Laterality: N/A;   DILATATION & CURETTAGE/HYSTEROSCOPY WITH MYOSURE N/A 06/08/2020   Procedure: DILATATION & CURETTAGE/HYSTEROSCOPY/Polypectomy WITH MYOSURE;  Surgeon: Armandina Stammer, DO;  Location: [facility];  Service: Gynecology;  Laterality: N/A;   LEFT HEART CATH AND CORONARY ANGIOGRAPHY N/A 07/18/2021   Procedure: LEFT HEART CATH AND CORONARY ANGIOGRAPHY;  Surgeon: Jettie Booze, MD;  Location: [facility];  Service: Cardiovascular;  Laterality: N/A;   LUMBAR FUSION  2001   TOTAL HIP ARTHROPLASTY Right 01/17/2021   Procedure: TOTAL HIP ARTHROPLASTY ANTERIOR APPROACH;  Surgeon: Rod Can, MD;  Location: [facility];  Service: Orthopedics;  Laterality: Right;   UPPER GI ENDOSCOPY  yrs ago    Current Medications: Current Meds  Medication Sig    aspirin EC 81 MG tablet Take 81 mg by mouth daily. Swallow whole.   carvedilol (COREG) 6.25 MG tablet Take 1 tablet (6.25 mg total) by mouth 2 (two) times daily with a meal.   cholecalciferol (VITAMIN D) 25 MCG (1000 UNIT) tablet Take 2,000 Units by mouth daily.   clonazePAM (KLONOPIN) 0.5 MG tablet TAKE 1 TABLET(0.5 MG) BY MOUTH AT BEDTIME   fexofenadine (ALLEGRA) 180 MG tablet Take 180 mg by mouth at bedtime.    Flaxseed, Linseed, (FLAXSEED OIL PO) Take 5-10 mLs by mouth 3 (three) times a week. Power   hydrALAZINE (APRESOLINE) 10 MG tablet Take 1 tablet (10 mg total) by mouth every 8 (eight) hours.   lamoTRIgine (LAMICTAL) 25 MG tablet Take one po qd x 7 days then one o  bid x 7 days then one po tid x 7 days then 2 po bid   leflunomide (ARAVA) 20 MG tablet Take 1 tablet (20 mg total) by mouth daily.   Lidocaine 4 % PTCH Place 1 patch onto the skin daily as needed (mild pain). Remove & Discard patch within 12 hours or as directed by MD   losartan (COZAAR) 100 MG tablet TAKE 1 TABLET(100 MG) BY MOUTH DAILY   melatonin 5 MG TABS Take 5 mg by mouth at bedtime as needed.   modafinil (PROVIGIL) 200 MG tablet Take 1 tablet (200 mg total) by mouth daily.   nitroGLYCERIN (NITROSTAT) 0.4 MG SL tablet Place 1 tablet (0.4 mg total) under the tongue every 5 (five) minutes as needed for chest pain.   omeprazole (PRILOSEC) 40 MG capsule TAKE 1 CAPSULE(40 MG) BY MOUTH DAILY   rosuvastatin (CRESTOR) 10 MG tablet Take 1 tablet (10 mg total) by mouth daily.   solifenacin (VESICARE) 5 MG tablet Take 1 tablet (5 mg total) by mouth daily.   ticagrelor (BRILINTA) 90 MG TABS tablet Take 1 tablet (90 mg total) by mouth 2 (two) times daily.     Allergies:   Statins   Social History   Socioeconomic History   Marital status: Widowed    Spouse name: Not on file   Number of children: Not on file   Years of education: Not on file   Highest education level: Not on file  Occupational History   Occupation: retired   Tobacco Use   Smoking status: Former    Packs/day: 0.50    Years: 15.00    Pack years: 7.50    Types: Cigarettes    Quit date: 08/05/1983    Years since quitting: 38.0   Smokeless tobacco: Never  Vaping Use   Vaping Use: Never used  Substance and Sexual Activity   Alcohol use: Yes    Alcohol/week: 0.0 standard drinks    Comment: rarely   Drug use: No   Sexual activity: Not Currently    Birth control/protection: Post-menopausal  Other Topics Concern   Not on file  Social History Narrative   Regular Exercise-no   Widowed   Retired   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     Tobacco use, amount per day now: former   Past tobacco use, amount per day: 1/2-1 packet   How many years did you use tobacco: 12 years   Alcohol use (drinks per week): 0   Diet: mediterranean based   Do you drink/eat things with caffeine: yes   Marital status:        widow                          What year were you married? 1987   Do you live in a house, apartment, assisted living, condo, trailer, etc.? house   Is it one or more stories? 1 story   How many persons live in your home? 1   Do you have pets in your home?( please list) no   Current or past profession: Science writer    Do you exercise?          yes  Type and how often? Tai chi and pt therapy, stretches etc....   Do you have a living will? yes   Do you have a DNR form?      no                             If not, do you want to discuss one? yes   Do you have signed POA/HPOA forms?      no                  If so, please bring to you appointment   Social Determinants of Health   Financial Resource Strain: Not on file  Food Insecurity: Not on file  Transportation Needs: Not on file  Physical Activity: Not on file  Stress: Not on file  Social  Connections: Not on file     Family History: The patient's family history includes Arthritis in an other family member; Heart failure in her father and mother; Hypertension in an other family member.  ROS:   Please see the history of present illness.     All other systems reviewed and are negative.  EKGs/Labs/Other Studies Reviewed:    The following studies were reviewed today:   EKG:  EKG is  ordered today.  The ekg ordered today demonstrates  NSR,  84m TWI laterally  NSR TWI inferiorly  Recent Labs: 06/15/2021: TSH 4.439 06/16/2021: ALT 16; Magnesium 2.3 07/25/2021: BUN 17; Creat 0.67; Potassium 4.5; Sodium 138 08/01/2021: Hemoglobin 13.0; Platelets 261  Recent Lipid Panel    Component Value Date/Time   CHOL 213 (H) 06/15/2021 0256   TRIG 133 06/15/2021 0256   HDL 44 06/15/2021 0256   CHOLHDL 4.8 06/15/2021 0256   VLDL 27 06/15/2021 0256   LDLCALC 142 (H) 06/15/2021 0256   LDLCALC 129 (H) 04/16/2021 0920   LDLDIRECT 130.2 11/03/2011 0920     Risk Assessment/Calculations:           Physical Exam:    VS:  BP 138/70 (BP Location: Left Arm, Patient Position: Sitting, Cuff Size: Normal)    Pulse 71    Resp 20    Ht '5\' 2"'  (1.575 m)    Wt 175 lb (79.4 kg)    SpO2 97%    BMI 32.01 kg/m     Wt Readings from Last 3 Encounters:  08/02/21 175 lb (79.4 kg)  08/01/21 175 lb 8 oz (79.6 kg)  07/25/21 175 lb 3.2 oz (79.5 kg)     GEN:  Well nourished, well developed in no acute distress HEENT: Normal NECK: No JVD; No carotid bruits LYMPHATICS: No lymphadenopathy CARDIAC: RRR, 3/6 SEM RUSB rubs, gallops RESPIRATORY:  Clear to auscultation without rales, wheezing or rhonchi  ABDOMEN: Soft, non-tender, non-distended MUSCULOSKELETAL:  No edema; No deformity  SKIN: Warm and dry NEUROLOGIC:  Alert and oriented x 3 PSYCHIATRIC:  Normal affect   ASSESSMENT:   #RCA PCI:  Inially presented after hospital FU reporting chest pressure and had nstemi. This was in the setting of  hypertensive emergency. Was seen in FU and CTA was obtained 2/2 symptoms and CVD risk including CVA and smoking. She had RCA  FFR+ lesion. She is s/p  prox-mid DES.She denies CP. SOB improving on Brillinta -cont DAPT at least until 07/18/2022  -cont oreg 6.25 mg BID -continue nitro SL -she has her own exercise program that she does for her MS and would like to continue with that instead  of cardiac rehab; I said that was fine  #HLD: LDL goal < 70 mg/dL - lipid profile in 6 weeks - continue crestor 10 mg daily  #HTN: well controlled.  Instructed to take blood pressures at home.Continue coreg 6.25 mg BID, hydralazine 10 mg TID, losartan 100 mg. Goal < 130/80 mmHg   PLAN:    In order of problems listed above:  Lipid profile in 6 weeks Follow up 6 months    Cardiac Rehabilitation Eligibility Assessment           Medication Adjustments/Labs and Tests Ordered: Current medicines are reviewed at length with the patient today.  Concerns regarding medicines are outlined above.  Orders Placed This Encounter  Procedures   Lipid panel   EKG 12-Lead   No orders of the defined types were placed in this encounter.   Patient Instructions  Medication Instructions:  No Changes In Medications at this time.  *If you need a refill on your cardiac medications before your next appointment, please call your pharmacy*  Lab Work: Please return for Blood Work in [place]. No appointment needed, lab here at the office is open Monday-Friday from 8AM to 4PM and closed daily for lunch from 12:45-1:45.   If you have labs (blood work) drawn today and your tests are completely normal, you will receive your results only by: [place] (if you have MyChart) OR A paper copy in the mail If you have any lab test that is abnormal or we need to change your treatment, we will call you to review the results.  Follow-Up: At [facility], you and your health needs are our priority.   As part of our continuing mission to provide you with exceptional heart care, we have created designated Provider Care Teams.  These Care Teams include your primary Cardiologist (physician) and Advanced Practice Providers (APPs -  Physician Assistants and Nurse Practitioners) who all work together to provide you with the care you need, when you need it.  Your next appointment:   6 month(s)  The format for your next appointment:   In Person  Provider:   Janina Mayo, MD     Signed, Janina Mayo, MD  08/02/2021 8:32 AM    [place]

## 2021-08-05 ENCOUNTER — Other Ambulatory Visit: Payer: Self-pay

## 2021-08-05 ENCOUNTER — Encounter (HOSPITAL_BASED_OUTPATIENT_CLINIC_OR_DEPARTMENT_OTHER): Payer: Self-pay | Admitting: Physical Therapy

## 2021-08-05 ENCOUNTER — Ambulatory Visit (HOSPITAL_BASED_OUTPATIENT_CLINIC_OR_DEPARTMENT_OTHER): Payer: Medicare Other | Attending: Neurology | Admitting: Physical Therapy

## 2021-08-05 DIAGNOSIS — M542 Cervicalgia: Secondary | ICD-10-CM

## 2021-08-05 DIAGNOSIS — G8929 Other chronic pain: Secondary | ICD-10-CM | POA: Diagnosis not present

## 2021-08-05 DIAGNOSIS — M6281 Muscle weakness (generalized): Secondary | ICD-10-CM

## 2021-08-05 DIAGNOSIS — R262 Difficulty in walking, not elsewhere classified: Secondary | ICD-10-CM

## 2021-08-05 DIAGNOSIS — R252 Cramp and spasm: Secondary | ICD-10-CM

## 2021-08-05 DIAGNOSIS — M545 Low back pain, unspecified: Secondary | ICD-10-CM | POA: Insufficient documentation

## 2021-08-05 NOTE — Therapy (Addendum)
[place], [place], [address] Phone: [phone]   Fax:  [phone]  Physical Therapy Treatment/ Re-eval  Progress notes   Progress Note Reporting Period 05/21/2021 to 08/05/2021  See note below for Objective Data and Assessment of Progress/Goals.      Patient Details  Name: Dawn [name] MRN: [id number] Date of Birth: August 02, 1945 Referring Provider (PT): Dr Arlice Colt   Encounter Date: 08/05/2021   PT End of Session - 08/05/21 1021     Visit Number 15    Number of Visits 23    Date for PT Re-Evaluation 09/30/21    Authorization Type progress note done on visit 15    PT Start Time 1015    PT Stop Time 1058    PT Time Calculation (min) 43 min    Activity Tolerance Patient tolerated treatment well    Behavior During Therapy [facility] for tasks assessed/performed             Past Medical History:  Diagnosis Date   Allergy    Dry eyes    Endometrial polyp    Essential hypertension 08/25/2018   GERD (gastroesophageal reflux disease)    Heart failure ([place])    History of hiatal hernia    History of kidney stones    Hot flashes, menopausal 10/13/2011   Estradiol started    Jaundice as teenager   no problems since   Memory loss 09/21/2019     1/2 of feet numb all the time   Multiple sclerosis ([place]) dx 2001   Neuropathy    Neuropathy    bilateral feet   Osteoarthritis    Sjogren's syndrome ([place]) dx oct 2021   sore muscvles, dry mouth and eyes   Stroke ([place])    Vertigo    Vision abnormalities     Past Surgical History:  Procedure Laterality Date   ABDOMINAL HYSTERECTOMY  2002   partial   colonscopy  2011   CORONARY STENT INTERVENTION N/A 07/18/2021   Procedure: CORONARY STENT INTERVENTION;  Surgeon: Jettie Booze, MD;  Location: [place] [facility];  Service: Cardiovascular;  Laterality: N/A;   DILATATION & CURETTAGE/HYSTEROSCOPY WITH MYOSURE N/A 06/08/2020   Procedure:  DILATATION & CURETTAGE/HYSTEROSCOPY/Polypectomy WITH MYOSURE;  Surgeon: Armandina Stammer, DO;  Location: [place];  Service: Gynecology;  Laterality: N/A;   LEFT HEART CATH AND CORONARY ANGIOGRAPHY N/A 07/18/2021   Procedure: LEFT HEART CATH AND CORONARY ANGIOGRAPHY;  Surgeon: Jettie Booze, MD;  Location: [place] [facility];  Service: Cardiovascular;  Laterality: N/A;   LUMBAR FUSION  2001   TOTAL HIP ARTHROPLASTY Right 01/17/2021   Procedure: TOTAL HIP ARTHROPLASTY ANTERIOR APPROACH;  Surgeon: Rod Can, MD;  [facility]: [facility];  Service: Orthopedics;  Laterality: Right;   UPPER GI ENDOSCOPY  yrs ago    There were no vitals filed for this visit.   Subjective Assessment - 08/05/21 1016     Subjective Patient had a heart cath on the 29th with a stent placement. She reports she feels tired. Her B/P was 148 /80 at baseline. SH ereturns reciving clearence from her carisologist and Dr Felecia Shelling to resume PT. She reports that her hip and back have been more painful since this all started. She has tried her exercises but has not been able to do much..    Pertinent History MS    How long can you stand comfortably? hurts as soon as she stands. Has to shift frequently  How long can you walk comfortably? limited community ambualtion    Diagnostic tests Nothing recent    Patient Stated Goals to go home    Currently in Pain? Yes    Pain Score 3     Pain Location Back    Pain Orientation Mid;Right;Left    Pain Descriptors / Indicators Aching    Pain Type Chronic pain    Pain Onset More than a month ago    Pain Frequency Intermittent    Aggravating Factors  standing and walking    Pain Relieving Factors rest    Effect of Pain on Daily Activities difficulty perfroming ADL's    Multiple Pain Sites No            manual therapy: trigger point release to left and right gluteal and paraspinals  Seated clamshell 3x10 yellow Seated laq 3x10 each leg  LTR x20   Reviewed  strength measurements and goals Reviewed gait assessment, HR, and      [facility] PT Assessment - 08/05/21 0001       Assessment   Medical Diagnosis Low Back Pain/ Right THA/ Gait distuebance    Referring Provider (PT) Dr Arlice Colt      Strength   Right Hip Flexion --   9.4   Right Hip ABduction --   13.8   Left Hip ABduction --   24.7   Right Knee Flexion --   17.6   Right Knee Extension --   14.8   Left Knee Extension --   28.4     Palpation   Palpation comment cobtinued tenderness to palpation in the hips and bilateral gluteal.      Ambulation/Gait   Gait Comments 300' baseline HR 65 854 after 300'. Increased right hip pain with 300' basleine B/P 148/80 after walk 162/92                                    PT Education - 08/05/21 1020     Education Details reviewed exercises to begin    Person(s) Educated Patient    Methods Explanation;Demonstration;Tactile cues;Verbal cues    Comprehension Verbalized understanding;Verbal cues required;Tactile cues required;Returned demonstration              PT Short Term Goals - 08/05/21 1055       PT SHORT TERM GOAL #1   Title Patient will report a 50% reduction in lower back pain    Baseline 4/10 pain today    Time 4    Period Weeks    Status On-going    Target Date 09/02/21      PT SHORT TERM GOAL #2   Title Patient will ambaulte 300' with no significant spike in B/P or HR    Time 4    Period Weeks    Status Revised    Target Date 09/02/21      PT SHORT TERM GOAL #3   Title Patient increase right hip flexion and abducion strength by 5 lbs    Time 4    Period Weeks    Status Revised    Target Date 09/02/21      PT SHORT TERM GOAL #4   Title Patient will be indepdnent with basic endruance and right hip strengthening program    Time 4    Period Weeks    Status Revised    Target Date 09/02/21  PT Long Term Goals - 08/05/21 1057       PT LONG TERM GOAL #1    Title Patient will return [facility] with a full program for her hip/ lower back/ and endurance    Time 8    Period Weeks    Status Revised    Target Date 09/30/21      PT LONG TERM GOAL #2   Title Patient will ambualte 600' the 6 min walk test in order to improve community ambualtion    Time 8    Period Weeks    Status Revised    Target Date 09/30/21      PT LONG TERM GOAL #3   Title Patient will stand for 30 min without a significant increase in pain in order to perfrom ADL's    Time 8    Period Weeks    Status Revised    Target Date 06/06/21                   Plan - 08/05/21 1037     Clinical Impression Statement Therapy peorformed a re-assessment on the patient today. She continues to have weakness on the right comapred to the left. We also looked at her endurance. Her HR and B/P all increased with the walking test. We will continue to monitor as we begin to work on improving her endurance. She has a large trigger point in her right gluteal. Sh ereported improved pain after manual therapy. Therapy focused on manual therapy and reviewing testing resutls. She would benefit from continued skilled therapy 1W8 to improve endurance and strength, and reduce pain. Her B/P after treatment was 150/88.    Personal Factors and Comorbidities Comorbidity 1;Comorbidity 2    Stability/Clinical Decision Making Stable/Uncomplicated    Clinical Decision Making Low    Rehab Potential Good    PT Frequency 1x / week    PT Duration 8 weeks    PT Treatment/Interventions ADLs/Self Care Home Management;Electrical Stimulation;Cryotherapy;Iontophoresis 4mg /ml Dexamethasone;Moist Heat;Traction;DME Instruction;Neuromuscular re-education;Patient/family education;Manual techniques;Passive range of motion;Taping;Therapeutic activities;Therapeutic exercise;Balance training;Gait training;Stair training;Functional mobility training;Dry needling;Ultrasound    PT Next Visit Plan add in endurance  exercises    PT Home Exercise Plan reviewed seated hamstring stretch and LTR. She has done those in the past.    Consulted and Agree with Plan of Care Patient             Patient will benefit from skilled therapeutic intervention in order to improve the following deficits and impairments:  Abnormal gait, Difficulty walking, Decreased range of motion, Decreased mobility, Decreased strength, Postural dysfunction, Pain, Increased muscle spasms, Decreased activity tolerance, Increased fascial restricitons  Visit Diagnosis: Chronic bilateral low back pain without sciatica  Difficulty in walking, not elsewhere classified  Muscle weakness (generalized)  Cramp and spasm  Cervicalgia     Problem List Patient Active Problem List   Diagnosis Date Noted   Coronary artery disease    Acute diastolic CHF (congestive heart failure) ([place]) 06/15/2021   Demand ischemia ([place]) 06/15/2021   Chest pain 06/14/2021   Elevated troponin 06/14/2021   Statin intolerance 04/19/2021   Osteoarthritis of right hip 01/17/2021   Status post THR (total hip replacement) 01/17/2021   Chronic bilateral low back pain without sciatica 10/02/2020   Chronic pain syndrome 06/20/2020   Post laminectomy syndrome 06/20/2020   Sjogren's disease ([place]) 05/23/2020   Primary osteoarthritis of both hands 05/23/2020   Primary osteoarthritis of both feet 05/23/2020   Sacroiliitis, not elsewhere  classified ([place]) 12/22/2019   Other secondary scoliosis, lumbar region 12/22/2019   Spondylosis without myelopathy or radiculopathy, lumbar region 12/22/2019   Cervical radiculopathy 10/18/2019   Numbness 09/21/2019   Bilateral carpal tunnel syndrome 09/21/2019   High risk medication use 09/21/2019   Memory loss 09/21/2019   Essential hypertension 08/25/2018   Abnormal SPEP 02/19/2018   Hand pain 02/20/2017   Multiple joint pain 02/18/2017   Right elbow pain 12/09/2016   Disturbed cognition 06/25/2016   Trochanteric  bursitis of left hip 02/18/2016   Sciatica, right side 10/30/2015   Bilateral arm pain 10/10/2015   Trochanteric bursitis of both hips 08/04/2014   Chronic fatigue 08/04/2014   Urinary frequency 08/04/2014   Abnormality of gait 08/04/2014   Unspecified visual disturbance 01/31/2013   Transient vision disturbance 01/31/2013   Routine general medical examination at a health care facility 11/04/2011   Colon polyps 11/03/2011   Hot flashes, menopausal 10/13/2011   POSTHERPETIC NEURALGIA 10/03/2009   DISPLCMT LUMBAR INTERVERT DISC W/O MYELOPATHY 09/05/2009   RENAL CALCULUS, RECURRENT 09/04/2009   Hyperlipidemia 08/31/2009   Multiple sclerosis ([place]) 08/31/2009   ALLERGIC RHINITIS 08/31/2009    Carney Living, PT 08/05/2021, 11:19 AM  [place], [place], [address] Phone: [phone]   Fax:  [phone]  Name: Dawn [name] MRN: [id number] Date of Birth: 24-Nov-1945

## 2021-08-12 ENCOUNTER — Ambulatory Visit (HOSPITAL_BASED_OUTPATIENT_CLINIC_OR_DEPARTMENT_OTHER): Payer: Medicare Other | Admitting: Physical Therapy

## 2021-08-12 ENCOUNTER — Encounter (HOSPITAL_BASED_OUTPATIENT_CLINIC_OR_DEPARTMENT_OTHER): Payer: Self-pay | Admitting: Physical Therapy

## 2021-08-12 ENCOUNTER — Other Ambulatory Visit: Payer: Self-pay

## 2021-08-12 DIAGNOSIS — R262 Difficulty in walking, not elsewhere classified: Secondary | ICD-10-CM | POA: Diagnosis not present

## 2021-08-12 DIAGNOSIS — R252 Cramp and spasm: Secondary | ICD-10-CM

## 2021-08-12 DIAGNOSIS — M545 Low back pain, unspecified: Secondary | ICD-10-CM

## 2021-08-12 DIAGNOSIS — G8929 Other chronic pain: Secondary | ICD-10-CM

## 2021-08-12 DIAGNOSIS — M542 Cervicalgia: Secondary | ICD-10-CM

## 2021-08-12 DIAGNOSIS — M6281 Muscle weakness (generalized): Secondary | ICD-10-CM

## 2021-08-12 NOTE — Therapy (Signed)
[facility] [address] [place], [place], [address] Phone: [phone]   Fax:  [phone]  Physical Therapy Treatment  Patient Details  Name: Dawn [name] MRN: [id number] Date of Birth: [birth year] Referring Provider (PT): Dr Arlice Colt   Encounter Date: 08/12/2021   PT End of Session - 08/12/21 1048     Visit Number 16    Number of Visits 23    Date for PT Re-Evaluation 09/30/21    Authorization Type progress note done on visit 15    PT Start Time 1015    PT Stop Time 1057    PT Time Calculation (min) 42 min    Activity Tolerance Patient tolerated treatment well    Behavior During Therapy [facility] for tasks assessed/performed             Past Medical History:  Diagnosis Date   Allergy    Dry eyes    Endometrial polyp    Essential hypertension 08/25/2018   GERD (gastroesophageal reflux disease)    Heart failure ([place])    History of hiatal hernia    History of kidney stones    Hot flashes, menopausal 10/13/2011   Estradiol started    Jaundice as teenager   no problems since   Memory loss 09/21/2019     1/2 of feet numb all the time   Multiple sclerosis ([place]) dx 2001   Neuropathy    Neuropathy    bilateral feet   Osteoarthritis    Sjogren's syndrome ([place]) dx oct 2021   sore muscvles, dry mouth and eyes   Stroke ([place])    Vertigo    Vision abnormalities     Past Surgical History:  Procedure Laterality Date   ABDOMINAL HYSTERECTOMY  2002   partial   colonscopy  2011   CORONARY STENT INTERVENTION N/A 07/18/2021   Procedure: CORONARY STENT INTERVENTION;  Surgeon: Jettie Booze, MD;  Location: [place] [facility];  Service: Cardiovascular;  Laterality: N/A;   DILATATION & CURETTAGE/HYSTEROSCOPY WITH MYOSURE N/A 06/08/2020   Procedure: DILATATION & CURETTAGE/HYSTEROSCOPY/Polypectomy WITH MYOSURE;  Surgeon: Armandina Stammer, DO;  Location: [facility];  Service: Gynecology;   Laterality: N/A;   LEFT HEART CATH AND CORONARY ANGIOGRAPHY N/A 07/18/2021   Procedure: LEFT HEART CATH AND CORONARY ANGIOGRAPHY;  Surgeon: Jettie Booze, MD;  Location: [facility] [facility];  Service: Cardiovascular;  Laterality: N/A;   LUMBAR FUSION  2001   TOTAL HIP ARTHROPLASTY Right 01/17/2021   Procedure: TOTAL HIP ARTHROPLASTY ANTERIOR APPROACH;  Surgeon: Rod Can, MD;  [facility];  Service: Orthopedics;  Laterality: Right;   UPPER GI ENDOSCOPY  yrs ago    There were no vitals filed for this visit.   Subjective Assessment - 08/12/21 1038     Subjective Patient responed well to the last visit. She has had an improvement in pain. She has been stretching and using her tennis ball.    Pertinent History MS    How long can you stand comfortably? hurts as soon as she stands. Has to shift frequently    How long can you walk comfortably? limited community ambualtion    Diagnostic tests Nothing recent    Patient Stated Goals to go home    Currently in Pain? Yes    Pain Score 3     Pain Location Back    Pain Orientation Right;Left    Pain Descriptors / Indicators Aching    Pain Type Chronic pain  Pain Onset More than a month ago    Pain Frequency Intermittent    Aggravating Factors  standing and walking    Pain Relieving Factors rest    Effect of Pain on Daily Activities difficuty perfroming ADL's    Multiple Pain Sites No                manual therapy: trigger point release to left and right gluteal and paraspinals   Seated clamshell 3x10 yellow Seated laq 3x10 each leg  Supine march 2x20  Calmshell green 2x20   Seated ball squeeze 2x20   Seated bilateral ER 2x15 yellow  Seated horizontal abdcution 2x15 yellow.      LTR x20                         PT Education - 08/12/21 1047     Education Details put patient back into a light exercises and cardio program.    Person(s) Educated Patient    Methods  Explanation;Demonstration;Tactile cues    Comprehension Returned demonstration;Verbal cues required;Tactile cues required;Verbalized understanding              PT Short Term Goals - 08/05/21 1055       PT SHORT TERM GOAL #1   Title Patient will report a 50% reduction in lower back pain    Baseline 4/10 pain today    Time 4    Period Weeks    Status On-going    Target Date 09/02/21      PT SHORT TERM GOAL #2   Title Patient will ambaulte 300' with no significant spike in B/P or HR    Time 4    Period Weeks    Status Revised    Target Date 09/02/21      PT SHORT TERM GOAL #3   Title Patient increase right hip flexion and abducion strength by 5 lbs    Time 4    Period Weeks    Status Revised    Target Date 09/02/21      PT SHORT TERM GOAL #4   Title Patient will be indepdnent with basic endruance and right hip strengthening program    Time 4    Period Weeks    Status Revised    Target Date 09/02/21               PT Long Term Goals - 08/05/21 1057       PT LONG TERM GOAL #1   Title Patient will return to [facility] with a full program for her hip/ lower back/ and endurance    Time 8    Period Weeks    Status Revised    Target Date 09/30/21      PT LONG TERM GOAL #2   Title Patient will ambualte 600' the 6 min walk test in order to improve community ambualtion    Time 8    Period Weeks    Status Revised    Target Date 09/30/21      PT LONG TERM GOAL #3   Title Patient will stand for 30 min without a significant increase in pain in order to perfrom ADL's    Time 8    Period Weeks    Status Revised    Target Date 06/06/21                   Plan - 08/12/21 1051     Clinical Impression Statement  Patient continues to have trigger points in her hips, but it has improved. Therapy reviewed light exercises with the patient. Her baseline B/P was 140/79. She had no signifcant increase in HR with her exercises. She started out with the nu-step on  a low level. Theray reviewed UE and LE exercises    Personal Factors and Comorbidities Comorbidity 1;Comorbidity 2    Comorbidities MS, OA    Examination-Activity Limitations Bed Mobility    Examination-Participation Restrictions Meal Prep;Cleaning;Community Activity;Laundry;Shop    Stability/Clinical Decision Making Stable/Uncomplicated    Clinical Decision Making Low    Rehab Potential Good    PT Frequency 1x / week    PT Duration 8 weeks    PT Treatment/Interventions ADLs/Self Care Home Management;Electrical Stimulation;Cryotherapy;Iontophoresis 4mg /ml Dexamethasone;Moist Heat;Traction;DME Instruction;Neuromuscular re-education;Patient/family education;Manual techniques;Passive range of motion;Taping;Therapeutic activities;Therapeutic exercise;Balance training;Gait training;Stair training;Functional mobility training;Dry needling;Ultrasound    PT Next Visit Plan add in endurance exercises    PT Home Exercise Plan reviewed seated hamstring stretch and LTR. She has done those in the past.    Consulted and Agree with Plan of Care Patient             Patient will benefit from skilled therapeutic intervention in order to improve the following deficits and impairments:  Abnormal gait, Difficulty walking, Decreased range of motion, Decreased mobility, Decreased strength, Postural dysfunction, Pain, Increased muscle spasms, Decreased activity tolerance, Increased fascial restricitons  Visit Diagnosis: Chronic bilateral low back pain without sciatica  Difficulty in walking, not elsewhere classified  Muscle weakness (generalized)  Cramp and spasm  Cervicalgia     Problem List Patient Active Problem List   Diagnosis Date Noted   Coronary artery disease    Acute diastolic CHF (congestive heart failure) ([place]) 06/15/2021   Demand ischemia ([place]) 06/15/2021   Chest pain 06/14/2021   Elevated troponin 06/14/2021   Statin intolerance 04/19/2021   Osteoarthritis of right hip 01/17/2021    Status post THR (total hip replacement) 01/17/2021   Chronic bilateral low back pain without sciatica 10/02/2020   Chronic pain syndrome 06/20/2020   Post laminectomy syndrome 06/20/2020   Sjogren's disease ([place]) 05/23/2020   Primary osteoarthritis of both hands 05/23/2020   Primary osteoarthritis of both feet 05/23/2020   Sacroiliitis, not elsewhere classified ([place]) 12/22/2019   Other secondary scoliosis, lumbar region 12/22/2019   Spondylosis without myelopathy or radiculopathy, lumbar region 12/22/2019   Cervical radiculopathy 10/18/2019   Numbness 09/21/2019   Bilateral carpal tunnel syndrome 09/21/2019   High risk medication use 09/21/2019   Memory loss 09/21/2019   Essential hypertension 08/25/2018   Abnormal SPEP 02/19/2018   Hand pain 02/20/2017   Multiple joint pain 02/18/2017   Right elbow pain 12/09/2016   Disturbed cognition 06/25/2016   Trochanteric bursitis of left hip 02/18/2016   Sciatica, right side 10/30/2015   Bilateral arm pain 10/10/2015   Trochanteric bursitis of both hips 08/04/2014   Chronic fatigue 08/04/2014   Urinary frequency 08/04/2014   Abnormality of gait 08/04/2014   Unspecified visual disturbance 01/31/2013   Transient vision disturbance 01/31/2013   Routine general medical examination at a health care facility 11/04/2011   Colon polyps 11/03/2011   Hot flashes, menopausal 10/13/2011   POSTHERPETIC NEURALGIA 10/03/2009   DISPLCMT LUMBAR INTERVERT DISC W/O MYELOPATHY 09/05/2009   RENAL CALCULUS, RECURRENT 09/04/2009   Hyperlipidemia 08/31/2009   Multiple sclerosis ([place]) 08/31/2009   ALLERGIC RHINITIS 08/31/2009    Carney Living, PT 08/12/2021, 1:07 PM  [facility] [place] [phone]  [place], [place], [address] Phone: [phone]   Fax:  [phone]  Name: LEGACY CARRENDER MRN: [id number] Date of Birth: [birth year]

## 2021-08-14 ENCOUNTER — Other Ambulatory Visit (HOSPITAL_COMMUNITY): Payer: Self-pay

## 2021-08-14 ENCOUNTER — Other Ambulatory Visit: Payer: Self-pay | Admitting: Student

## 2021-08-16 ENCOUNTER — Telehealth: Payer: Self-pay | Admitting: Internal Medicine

## 2021-08-16 NOTE — Telephone Encounter (Signed)
Pt c/o medication issue:  1. Name of Medication: ticagrelor (BRILINTA) 90 MG TABS tablet  2. How are you currently taking this medication (dosage and times per day)? Take 1 tablet (90 mg total) by mouth 2 (two) times daily.  3. Are you having a reaction (difficulty breathing--STAT)? no  4. What is your medication issue? Pt went to refill and states medication is over $500  would like to know if there are any alternatives or assistance available... please advise

## 2021-08-16 NOTE — Telephone Encounter (Signed)
Spoke with patient about Brilinta assistance. Patient will come to get brilinta 90 mg samples (8) and application for assistance.

## 2021-08-19 ENCOUNTER — Other Ambulatory Visit: Payer: Self-pay

## 2021-08-19 ENCOUNTER — Ambulatory Visit (HOSPITAL_BASED_OUTPATIENT_CLINIC_OR_DEPARTMENT_OTHER): Payer: Medicare Other | Admitting: Physical Therapy

## 2021-08-19 DIAGNOSIS — R262 Difficulty in walking, not elsewhere classified: Secondary | ICD-10-CM

## 2021-08-19 DIAGNOSIS — M6281 Muscle weakness (generalized): Secondary | ICD-10-CM

## 2021-08-19 DIAGNOSIS — G8929 Other chronic pain: Secondary | ICD-10-CM | POA: Diagnosis not present

## 2021-08-19 DIAGNOSIS — R252 Cramp and spasm: Secondary | ICD-10-CM

## 2021-08-19 DIAGNOSIS — M542 Cervicalgia: Secondary | ICD-10-CM

## 2021-08-19 DIAGNOSIS — M545 Low back pain, unspecified: Secondary | ICD-10-CM | POA: Diagnosis not present

## 2021-08-19 NOTE — Therapy (Signed)
[facility] [address] [place], [place], [address] Phone: [phone]   Fax:  [phone]  Physical Therapy Treatment  Patient Details  Name: Dawn [name] MRN: [id number] Date of Birth: 03/18/1946 Referring Provider (PT): Dr Arlice Colt   Encounter Date: 08/19/2021   PT End of Session - 08/19/21 1419     Visit Number 17    Number of Visits 23    Date for PT Re-Evaluation 09/30/21    Authorization Type progress note done on visit 24    PT Start Time 1006    PT Stop Time 1048    PT Time Calculation (min) 42 min    Activity Tolerance Patient tolerated treatment well    Behavior During Therapy [facility] for tasks assessed/performed             Past Medical History:  Diagnosis Date   Allergy    Dry eyes    Endometrial polyp    Essential hypertension 08/25/2018   GERD (gastroesophageal reflux disease)    Heart failure ([place])    History of hiatal hernia    History of kidney stones    Hot flashes, menopausal 10/13/2011   Estradiol started    Jaundice as teenager   no problems since   Memory loss 09/21/2019     1/2 of feet numb all the time   Multiple sclerosis ([place]) dx 2001   Neuropathy    Neuropathy    bilateral feet   Osteoarthritis    Sjogren's syndrome ([place]) dx oct 2021   sore muscvles, dry mouth and eyes   Stroke ([place])    Vertigo    Vision abnormalities     Past Surgical History:  Procedure Laterality Date   ABDOMINAL HYSTERECTOMY  2002   partial   colonscopy  2011   CORONARY STENT INTERVENTION N/A 07/18/2021   Procedure: CORONARY STENT INTERVENTION;  Surgeon: Jettie Booze, MD;  Location: [facility];  Service: Cardiovascular;  Laterality: N/A;   DILATATION & CURETTAGE/HYSTEROSCOPY WITH MYOSURE N/A 06/08/2020   Procedure: DILATATION & CURETTAGE/HYSTEROSCOPY/Polypectomy WITH MYOSURE;  Surgeon: Armandina Stammer, DO;  Location: [facility] [facility];  Service: Gynecology;   Laterality: N/A;   LEFT HEART CATH AND CORONARY ANGIOGRAPHY N/A 07/18/2021   Procedure: LEFT HEART CATH AND CORONARY ANGIOGRAPHY;  Surgeon: Jettie Booze, MD;  Location: [facility];  Service: Cardiovascular;  Laterality: N/A;   LUMBAR FUSION  2001   TOTAL HIP ARTHROPLASTY Right 01/17/2021   Procedure: TOTAL HIP ARTHROPLASTY ANTERIOR APPROACH;  Surgeon: Rod Can, MD;  Location: [facility];  Service: Orthopedics;  Laterality: Right;   UPPER GI ENDOSCOPY  yrs ago    There were no vitals filed for this visit.   Subjective Assessment - 08/19/21 1353     Subjective Patient reports she continues to feel better. She is still short of breath at times. She has been told it is a side effect of 1 of her meidcations. she had noadverse reactions to her last visit.    Pertinent History MS    How long can you stand comfortably? hurts as soon as she stands. Has to shift frequently    Currently in Pain? Yes    Pain Score 5     Pain Location Back    Pain Orientation Right;Left    Pain Descriptors / Indicators Aching    Pain Type Chronic pain    Pain Onset More than a month ago    Pain Frequency  Intermittent    Aggravating Factors  standing and walking    Pain Relieving Factors rest    Multiple Pain Sites No                                manual therapy: trigger point release to left and right gluteal and paraspinals   Seated clamshell 3x10 red  Seated laq 3x10 each leg red    Supine march 2x20  Calmshell green 2x20    Intervals 2 min x4 intervals L2          LTR x20             PT Short Term Goals - 08/05/21 1055       PT SHORT TERM GOAL #1   Title Patient will report a 50% reduction in lower back pain    Baseline 4/10 pain today    Time 4    Period Weeks    Status On-going    Target Date 09/02/21      PT SHORT TERM GOAL #2   Title Patient will ambaulte 300' with no significant spike in B/P or HR    Time 4    Period Weeks     Status Revised    Target Date 09/02/21      PT SHORT TERM GOAL #3   Title Patient increase right hip flexion and abducion strength by 5 lbs    Time 4    Period Weeks    Status Revised    Target Date 09/02/21      PT SHORT TERM GOAL #4   Title Patient will be indepdnent with basic endruance and right hip strengthening program    Time 4    Period Weeks    Status Revised    Target Date 09/02/21               PT Long Term Goals - 08/05/21 1057       PT LONG TERM GOAL #1   Title Patient will return to [facility] with a full program for her hip/ lower back/ and endurance    Time 8    Period Weeks    Status Revised    Target Date 09/30/21      PT LONG TERM GOAL #2   Title Patient will ambualte 600' the 6 min walk test in order to improve community ambualtion    Time 8    Period Weeks    Status Revised    Target Date 09/30/21      PT LONG TERM GOAL #3   Title Patient will stand for 30 min without a significant increase in pain in order to perfrom ADL's    Time 8    Period Weeks    Status Revised    Target Date 06/06/21                   Plan - 08/19/21 1019     Clinical Impression Statement 112/70 baseline B/P 65 HR baseline 76 after 4 intervals 131/81. Overall she tolerated treatment well. She continues to have spasming, but it  has improved. We continue. s to work on [employer] with LE and core strengthenign for her back and hips. Of note, she did have a progression of her B/P with tlight activity today. Her B/P was  143/87 after treatment. Therapy will continue to monitor and work work on her back as well.  Personal Factors and Comorbidities Comorbidity 1;Comorbidity 2    Comorbidities MS, OA    Examination-Activity Limitations Bed Mobility    Examination-Participation Restrictions Meal Prep;Cleaning;Community Activity;Laundry;Shop    Stability/Clinical Decision Making Stable/Uncomplicated    Clinical Decision Making Low    Rehab  Potential Good    PT Frequency 1x / week    PT Duration 8 weeks    PT Treatment/Interventions ADLs/Self Care Home Management;Electrical Stimulation;Cryotherapy;Iontophoresis 4mg /ml Dexamethasone;Moist Heat;Traction;DME Instruction;Neuromuscular re-education;Patient/family education;Manual techniques;Passive range of motion;Taping;Therapeutic activities;Therapeutic exercise;Balance training;Gait training;Stair training;Functional mobility training;Dry needling;Ultrasound    PT Next Visit Plan add in endurance exercises; review tolerance to interval training    PT Home Exercise Plan reviewed seated hamstring stretch and LTR. She has done those in the past.             Patient will benefit from skilled therapeutic intervention in order to improve the following deficits and impairments:  Abnormal gait, Difficulty walking, Decreased range of motion, Decreased mobility, Decreased strength, Postural dysfunction, Pain, Increased muscle spasms, Decreased activity tolerance, Increased fascial restricitons  Visit Diagnosis: Chronic bilateral low back pain without sciatica  Difficulty in walking, not elsewhere classified  Muscle weakness (generalized)  Cervicalgia  Cramp and spasm     Problem List Patient Active Problem List   Diagnosis Date Noted   Coronary artery disease    Acute diastolic CHF (congestive heart failure) ([place]) 06/15/2021   Demand ischemia ([place]) 06/15/2021   Chest pain 06/14/2021   Elevated troponin 06/14/2021   Statin intolerance 04/19/2021   Osteoarthritis of right hip 01/17/2021   Status post THR (total hip replacement) 01/17/2021   Chronic bilateral low back pain without sciatica 10/02/2020   Chronic pain syndrome 06/20/2020   Post laminectomy syndrome 06/20/2020   Sjogren's disease ([place]) 05/23/2020   Primary osteoarthritis of both hands 05/23/2020   Primary osteoarthritis of both feet 05/23/2020   Sacroiliitis, not elsewhere classified ([place]) 12/22/2019   Other  secondary scoliosis, lumbar region 12/22/2019   Spondylosis without myelopathy or radiculopathy, lumbar region 12/22/2019   Cervical radiculopathy 10/18/2019   Numbness 09/21/2019   Bilateral carpal tunnel syndrome 09/21/2019   High risk medication use 09/21/2019   Memory loss 09/21/2019   Essential hypertension 08/25/2018   Abnormal SPEP 02/19/2018   Hand pain 02/20/2017   Multiple joint pain 02/18/2017   Right elbow pain 12/09/2016   Disturbed cognition 06/25/2016   Trochanteric bursitis of left hip 02/18/2016   Sciatica, right side 10/30/2015   Bilateral arm pain 10/10/2015   Trochanteric bursitis of both hips 08/04/2014   Chronic fatigue 08/04/2014   Urinary frequency 08/04/2014   Abnormality of gait 08/04/2014   Unspecified visual disturbance 01/31/2013   Transient vision disturbance 01/31/2013   Routine general medical examination at a health care facility 11/04/2011   Colon polyps 11/03/2011   Hot flashes, menopausal 10/13/2011   POSTHERPETIC NEURALGIA 10/03/2009   DISPLCMT LUMBAR INTERVERT DISC W/O MYELOPATHY 09/05/2009   RENAL CALCULUS, RECURRENT 09/04/2009   Hyperlipidemia 08/31/2009   Multiple sclerosis ([place]) 08/31/2009   ALLERGIC RHINITIS 08/31/2009    Carney Living, PT 08/19/2021, 2:20 PM  [facility] [address] [place], [place], [address] Phone: [phone]   Fax:  [phone]  Name: LORREN ROSSETTI MRN: [id number] Date of Birth: 1946/05/30

## 2021-08-26 ENCOUNTER — Other Ambulatory Visit: Payer: Medicare Other

## 2021-08-28 ENCOUNTER — Ambulatory Visit (HOSPITAL_BASED_OUTPATIENT_CLINIC_OR_DEPARTMENT_OTHER): Payer: Medicare Other | Attending: Neurology | Admitting: Physical Therapy

## 2021-08-28 ENCOUNTER — Other Ambulatory Visit: Payer: Self-pay

## 2021-08-28 ENCOUNTER — Encounter (HOSPITAL_BASED_OUTPATIENT_CLINIC_OR_DEPARTMENT_OTHER): Payer: Self-pay | Admitting: Physical Therapy

## 2021-08-28 DIAGNOSIS — G8929 Other chronic pain: Secondary | ICD-10-CM | POA: Diagnosis not present

## 2021-08-28 DIAGNOSIS — M545 Low back pain, unspecified: Secondary | ICD-10-CM | POA: Diagnosis not present

## 2021-08-28 DIAGNOSIS — R252 Cramp and spasm: Secondary | ICD-10-CM | POA: Insufficient documentation

## 2021-08-28 DIAGNOSIS — M6281 Muscle weakness (generalized): Secondary | ICD-10-CM | POA: Diagnosis not present

## 2021-08-28 DIAGNOSIS — R262 Difficulty in walking, not elsewhere classified: Secondary | ICD-10-CM | POA: Diagnosis not present

## 2021-08-28 DIAGNOSIS — M542 Cervicalgia: Secondary | ICD-10-CM | POA: Diagnosis not present

## 2021-08-29 ENCOUNTER — Encounter (HOSPITAL_BASED_OUTPATIENT_CLINIC_OR_DEPARTMENT_OTHER): Payer: Self-pay | Admitting: Physical Therapy

## 2021-08-29 NOTE — Therapy (Signed)
[facility] [address] [place], [place], [address] Phone: [phone]   Fax:  [phone]  Physical Therapy Treatment  Patient Details  Name: Dawn [name] MRN: [id number] Date of Birth: 01-23-1946 Referring Provider (PT): Dr Arlice Colt   Encounter Date: 08/28/2021   PT End of Session - 08/29/21 0710     Visit Number 18    Number of Visits 23    Date for PT Re-Evaluation 09/30/21    Authorization Type progress note done on visit 15    PT Start Time 1015    PT Stop Time 1058    PT Time Calculation (min) 43 min    Activity Tolerance Patient tolerated treatment well    Behavior During Therapy [facility] for tasks assessed/performed             Past Medical History:  Diagnosis Date   Allergy    Dry eyes    Endometrial polyp    Essential hypertension 08/25/2018   GERD (gastroesophageal reflux disease)    Heart failure ([place])    History of hiatal hernia    History of kidney stones    Hot flashes, menopausal 10/13/2011   Estradiol started    Jaundice as teenager   no problems since   Memory loss 09/21/2019     1/2 of feet numb all the time   Multiple sclerosis ([place]) dx 2001   Neuropathy    Neuropathy    bilateral feet   Osteoarthritis    Sjogren's syndrome ([place]) dx oct 2021   sore muscvles, dry mouth and eyes   Stroke ([place])    Vertigo    Vision abnormalities     Past Surgical History:  Procedure Laterality Date   ABDOMINAL HYSTERECTOMY  2002   partial   colonscopy  2011   CORONARY STENT INTERVENTION N/A 07/18/2021   Procedure: CORONARY STENT INTERVENTION;  Surgeon: Jettie Booze, MD;  Location: [facility];  Service: Cardiovascular;  Laterality: N/A;   DILATATION & CURETTAGE/HYSTEROSCOPY WITH MYOSURE N/A 06/08/2020   Procedure: DILATATION & CURETTAGE/HYSTEROSCOPY/Polypectomy WITH MYOSURE;  Surgeon: Armandina Stammer, DO;  Location: [facility];  Service: Gynecology;   Laterality: N/A;   LEFT HEART CATH AND CORONARY ANGIOGRAPHY N/A 07/18/2021   Procedure: LEFT HEART CATH AND CORONARY ANGIOGRAPHY;  Surgeon: Jettie Booze, MD;  Location: [facility];  Service: Cardiovascular;  Laterality: N/A;   LUMBAR FUSION  2001   TOTAL HIP ARTHROPLASTY Right 01/17/2021   Procedure: TOTAL HIP ARTHROPLASTY ANTERIOR APPROACH;  Surgeon: Rod Can, MD;  [facility];  Service: Orthopedics;  Laterality: Right;   UPPER GI ENDOSCOPY  yrs ago    There were no vitals filed for this visit.   Subjective Assessment - 08/29/21 0707     Subjective The patient reports she has been feeling less tired. She has been able to go to the grocery sotre. She hasn't been able to do that since her MI. Her back is back to a usual amount of pain.    Pertinent History MS    How long can you stand comfortably? hurts as soon as she stands. Has to shift frequently    How long can you walk comfortably? limited community ambualtion    Diagnostic tests Nothing recent    Patient Stated Goals to go home    Currently in Pain? Yes    Pain Score 5     Pain Location Back    Pain Orientation Right;Left  Pain Descriptors / Indicators Aching    Pain Type Chronic pain    Pain Onset More than a month ago    Pain Frequency Intermittent    Aggravating Factors  standing and walking    Pain Relieving Factors rest    Effect of Pain on Daily Activities difficulty perfroming ADL's    Multiple Pain Sites No                manual therapy: trigger point release to left and right gluteal and paraspinals      Supine march 2x20  Calmshell green 2x20   PPT 3x10   Wand flexion 3x10 2lb  Bilateral er 3x10 red  Horizontal abduction 3x10    Intervals 2 min x4 intervals L3 on nu-step           LTR x20  Piriformis stretch 3x20 second hold                             PT Education - 08/29/21 0710     Education Details reviewed exercisies    Person(s)  Educated Patient    Methods Explanation;Demonstration;Tactile cues;Verbal cues    Comprehension Verbalized understanding;Returned demonstration;Verbal cues required;Tactile cues required              PT Short Term Goals - 08/05/21 1055       PT SHORT TERM GOAL #1   Title Patient will report a 50% reduction in lower back pain    Baseline 4/10 pain today    Time 4    Period Weeks    Status On-going    Target Date 09/02/21      PT SHORT TERM GOAL #2   Title Patient will ambaulte 300' with no significant spike in B/P or HR    Time 4    Period Weeks    Status Revised    Target Date 09/02/21      PT SHORT TERM GOAL #3   Title Patient increase right hip flexion and abducion strength by 5 lbs    Time 4    Period Weeks    Status Revised    Target Date 09/02/21      PT SHORT TERM GOAL #4   Title Patient will be indepdnent with basic endruance and right hip strengthening program    Time 4    Period Weeks    Status Revised    Target Date 09/02/21               PT Long Term Goals - 08/05/21 1057       PT LONG TERM GOAL #1   Title Patient will return to [facility] with a full program for her hip/ lower back/ and endurance    Time 8    Period Weeks    Status Revised    Target Date 09/30/21      PT LONG TERM GOAL #2   Title Patient will ambualte 600' the 6 min walk test in order to improve community ambualtion    Time 8    Period Weeks    Status Revised    Target Date 09/30/21      PT LONG TERM GOAL #3   Title Patient will stand for 30 min without a significant increase in pain in order to perfrom ADL's    Time 8    Period Weeks    Status Revised    Target Date 06/06/21  Plan - 08/29/21 0710     Clinical Impression Statement Patient intial blod pressure was high on her cuff. When taken on our cuff it was 127/70. Her HER stayed below 80 bpm on the nu-step doing intervals. We were able to add an interbval today. She perforemd  upper body exercises today in supine. She reports that seems to be better for her back. Therapy ill continue to focus on cardiovascualr endurance and UE /LE strengthening with the patient. She reports her B/P cuff gives wrong readings. She was advised to get a cuff that is more acurate.    Personal Factors and Comorbidities Comorbidity 1;Comorbidity 2    Comorbidities MS, OA    Examination-Activity Limitations Bed Mobility    Stability/Clinical Decision Making Stable/Uncomplicated    Clinical Decision Making Low    Rehab Potential Good    PT Frequency 1x / week    PT Duration 8 weeks    PT Treatment/Interventions ADLs/Self Care Home Management;Electrical Stimulation;Cryotherapy;Iontophoresis 4mg /ml Dexamethasone;Moist Heat;Traction;DME Instruction;Neuromuscular re-education;Patient/family education;Manual techniques;Passive range of motion;Taping;Therapeutic activities;Therapeutic exercise;Balance training;Gait training;Stair training;Functional mobility training;Dry needling;Ultrasound    PT Next Visit Plan add in endurance exercises; review tolerance to interval training    PT Home Exercise Plan reviewed seated hamstring stretch and LTR. She has done those in the past.    Consulted and Agree with Plan of Care Patient             Patient will benefit from skilled therapeutic intervention in order to improve the following deficits and impairments:  Abnormal gait, Difficulty walking, Decreased range of motion, Decreased mobility, Decreased strength, Postural dysfunction, Pain, Increased muscle spasms, Decreased activity tolerance, Increased fascial restricitons  Visit Diagnosis: Chronic bilateral low back pain without sciatica  Difficulty in walking, not elsewhere classified  Muscle weakness (generalized)     Problem List Patient Active Problem List   Diagnosis Date Noted   Coronary artery disease    Acute diastolic CHF (congestive heart failure) ([place]) 06/15/2021   Demand ischemia  ([place]) 06/15/2021   Chest pain 06/14/2021   Elevated troponin 06/14/2021   Statin intolerance 04/19/2021   Osteoarthritis of right hip 01/17/2021   Status post THR (total hip replacement) 01/17/2021   Chronic bilateral low back pain without sciatica 10/02/2020   Chronic pain syndrome 06/20/2020   Post laminectomy syndrome 06/20/2020   Sjogren's disease ([place]) 05/23/2020   Primary osteoarthritis of both hands 05/23/2020   Primary osteoarthritis of both feet 05/23/2020   Sacroiliitis, not elsewhere classified ([place]) 12/22/2019   Other secondary scoliosis, lumbar region 12/22/2019   Spondylosis without myelopathy or radiculopathy, lumbar region 12/22/2019   Cervical radiculopathy 10/18/2019   Numbness 09/21/2019   Bilateral carpal tunnel syndrome 09/21/2019   High risk medication use 09/21/2019   Memory loss 09/21/2019   Essential hypertension 08/25/2018   Abnormal SPEP 02/19/2018   Hand pain 02/20/2017   Multiple joint pain 02/18/2017   Right elbow pain 12/09/2016   Disturbed cognition 06/25/2016   Trochanteric bursitis of left hip 02/18/2016   Sciatica, right side 10/30/2015   Bilateral arm pain 10/10/2015   Trochanteric bursitis of both hips 08/04/2014   Chronic fatigue 08/04/2014   Urinary frequency 08/04/2014   Abnormality of gait 08/04/2014   Unspecified visual disturbance 01/31/2013   Transient vision disturbance 01/31/2013   Routine general medical examination at a health care facility 11/04/2011   Colon polyps 11/03/2011   Hot flashes, menopausal 10/13/2011   POSTHERPETIC NEURALGIA 10/03/2009   DISPLCMT LUMBAR INTERVERT DISC W/O MYELOPATHY 09/05/2009  RENAL CALCULUS, RECURRENT 09/04/2009   Hyperlipidemia 08/31/2009   Multiple sclerosis ([place]) 08/31/2009   ALLERGIC RHINITIS 08/31/2009    Carney Living, PT 08/29/2021, 3:04 PM  [facility] [place], [place], [address] Phone: [phone]   Fax:   [phone]  Name: Dawn [name] MRN: [id number] Date of Birth: 1945-11-14

## 2021-08-30 ENCOUNTER — Telehealth: Payer: Self-pay | Admitting: Internal Medicine

## 2021-08-30 NOTE — Telephone Encounter (Signed)
Spoke to patient she stated she has 2 red spots on right great toe and red spot in mouth.She just noticed this week.Stated they do not itch or hurt.She wanted to know if her medications would be causing.She has been taking Brilinta since this past December and the other medications were started in November.Advised I will send message to pharmacy for advice.

## 2021-08-30 NOTE — Telephone Encounter (Signed)
Patient is calling stating she has red blotches on her right big toe and a spot on her lip. Is wanting to know if this could be due to her heart medications due to seeing this could be a side effect of them.

## 2021-09-03 ENCOUNTER — Other Ambulatory Visit: Payer: Self-pay

## 2021-09-03 DIAGNOSIS — E7800 Pure hypercholesterolemia, unspecified: Secondary | ICD-10-CM | POA: Diagnosis not present

## 2021-09-03 LAB — LIPID PANEL
Chol/HDL Ratio: 3.5 ratio (ref 0.0–4.4)
Cholesterol, Total: 138 mg/dL (ref 100–199)
HDL: 39 mg/dL — ABNORMAL LOW (ref >39)
LDL Chol Calc (NIH): 69 mg/dL (ref 0–99)
Triglycerides: 174 mg/dL — ABNORMAL HIGH (ref 0–149)
VLDL Cholesterol Cal: 30 mg/dL (ref 5–40)

## 2021-09-03 NOTE — Telephone Encounter (Signed)
It doesn't sound like anything related to her medications.  Most medication reactions wouldn't be that localized.  Suggest she monitor and if they don't go away in a few days to reach out to PCP

## 2021-09-03 NOTE — Telephone Encounter (Signed)
Spoke to patient Dawn [name]'s advice given.

## 2021-09-04 ENCOUNTER — Other Ambulatory Visit: Payer: Self-pay

## 2021-09-04 ENCOUNTER — Ambulatory Visit (HOSPITAL_BASED_OUTPATIENT_CLINIC_OR_DEPARTMENT_OTHER): Payer: Medicare Other | Admitting: Physical Therapy

## 2021-09-04 ENCOUNTER — Encounter (HOSPITAL_BASED_OUTPATIENT_CLINIC_OR_DEPARTMENT_OTHER): Payer: Self-pay | Admitting: Physical Therapy

## 2021-09-04 DIAGNOSIS — R252 Cramp and spasm: Secondary | ICD-10-CM | POA: Diagnosis not present

## 2021-09-04 DIAGNOSIS — G8929 Other chronic pain: Secondary | ICD-10-CM

## 2021-09-04 DIAGNOSIS — M545 Low back pain, unspecified: Secondary | ICD-10-CM

## 2021-09-04 DIAGNOSIS — M542 Cervicalgia: Secondary | ICD-10-CM | POA: Diagnosis not present

## 2021-09-04 DIAGNOSIS — R262 Difficulty in walking, not elsewhere classified: Secondary | ICD-10-CM

## 2021-09-04 DIAGNOSIS — M6281 Muscle weakness (generalized): Secondary | ICD-10-CM

## 2021-09-05 ENCOUNTER — Encounter (HOSPITAL_BASED_OUTPATIENT_CLINIC_OR_DEPARTMENT_OTHER): Payer: Self-pay | Admitting: Physical Therapy

## 2021-09-05 NOTE — Therapy (Signed)
[facility] [address] [place], [place], [address] Phone: [phone]   Fax:  [phone]  Physical Therapy Treatment  Patient Details  Name: Dawn [name] MRN: [id number] Date of Birth: Oct 26, 1945 Referring Provider (PT): Dr Arlice Colt   Encounter Date: 09/04/2021   PT End of Session - 09/04/21 1542     Visit Number 19    Number of Visits 23    Date for PT Re-Evaluation 09/30/21    Authorization Type progress note done on visit 15    PT Start Time 1015    PT Stop Time 1057    PT Time Calculation (min) 42 min    Activity Tolerance Patient tolerated treatment well    Behavior During Therapy [facility] for tasks assessed/performed             Past Medical History:  Diagnosis Date   Allergy    Dry eyes    Endometrial polyp    Essential hypertension 08/25/2018   GERD (gastroesophageal reflux disease)    Heart failure ([place])    History of hiatal hernia    History of kidney stones    Hot flashes, menopausal 10/13/2011   Estradiol started    Jaundice as teenager   no problems since   Memory loss 09/21/2019     1/2 of feet numb all the time   Multiple sclerosis ([place]) dx 2001   Neuropathy    Neuropathy    bilateral feet   Osteoarthritis    Sjogren's syndrome ([place]) dx oct 2021   sore muscvles, dry mouth and eyes   Stroke ([place])    Vertigo    Vision abnormalities     Past Surgical History:  Procedure Laterality Date   ABDOMINAL HYSTERECTOMY  2002   partial   colonscopy  2011   CORONARY STENT INTERVENTION N/A 07/18/2021   Procedure: CORONARY STENT INTERVENTION;  Surgeon: Jettie Booze, MD;  Location: [facility];  Service: Cardiovascular;  Laterality: N/A;   DILATATION & CURETTAGE/HYSTEROSCOPY WITH MYOSURE N/A 06/08/2020   Procedure: DILATATION & CURETTAGE/HYSTEROSCOPY/Polypectomy WITH MYOSURE;  Surgeon: Armandina Stammer, DO;  Location: [facility];  Service: Gynecology;   Laterality: N/A;   LEFT HEART CATH AND CORONARY ANGIOGRAPHY N/A 07/18/2021   Procedure: LEFT HEART CATH AND CORONARY ANGIOGRAPHY;  Surgeon: Jettie Booze, MD;  Location: [facility];  Service: Cardiovascular;  Laterality: N/A;   LUMBAR FUSION  2001   TOTAL HIP ARTHROPLASTY Right 01/17/2021   Procedure: TOTAL HIP ARTHROPLASTY ANTERIOR APPROACH;  Surgeon: Rod Can, MD;  Location: [facility];  Service: Orthopedics;  Laterality: Right;   UPPER GI ENDOSCOPY  yrs ago    There were no vitals filed for this visit.   Subjective Assessment - 09/04/21 1024     Subjective The patient reports the low back exercises have been helpful. She continues to feel a little better and a little better. She feels like her hips have been getting a little more sore.    Pertinent History MS    How long can you stand comfortably? hurts as soon as she stands. Has to shift frequently    How long can you walk comfortably? limited community ambualtion    Diagnostic tests Nothing recent    Patient Stated Goals to go home    Currently in Pain? Yes    Pain Score 4     Pain Location Hip    Pain Orientation Right;Left    Pain Descriptors /  Indicators Aching    Pain Type Chronic pain    Pain Onset More than a month ago    Pain Frequency Intermittent    Aggravating Factors  standing and walking    Pain Relieving Factors rest    Effect of Pain on Daily Activities difficulty perfroming ADLs    Multiple Pain Sites No                              Supine march 2x20  Calmshell green 2x20   PPT 3x10  Supine ball squeeze 3x10 pink ball     Wand flexion 3x10 2lb   Seated LAQ 3x10 green   seated march 2x10    Intervals 2 min x4 intervals L3 on nu-step           LTR x20  Piriformis stretch 3x20 second hold     manual: roller to bilateral hips                        PT Education - 09/04/21 1542     Education Details HEP and symptom mangement    Person(s)  Educated Patient    Methods Explanation;Tactile cues;Demonstration;Verbal cues    Comprehension Verbalized understanding;Returned demonstration;Tactile cues required              PT Short Term Goals - 08/05/21 1055       PT SHORT TERM GOAL #1   Title Patient will report a 50% reduction in lower back pain    Baseline 4/10 pain today    Time 4    Period Weeks    Status On-going    Target Date 09/02/21      PT SHORT TERM GOAL #2   Title Patient will ambaulte 300' with no significant spike in B/P or HR    Time 4    Period Weeks    Status Revised    Target Date 09/02/21      PT SHORT TERM GOAL #3   Title Patient increase right hip flexion and abducion strength by 5 lbs    Time 4    Period Weeks    Status Revised    Target Date 09/02/21      PT SHORT TERM GOAL #4   Title Patient will be indepdnent with basic endruance and right hip strengthening program    Time 4    Period Weeks    Status Revised    Target Date 09/02/21               PT Long Term Goals - 08/05/21 1057       PT LONG TERM GOAL #1   Title Patient will [facility] with a full program for her hip/ lower back/ and endurance    Time 8    Period Weeks    Status Revised    Target Date 09/30/21      PT LONG TERM GOAL #2   Title Patient will ambualte 600' the 6 min walk test in order to improve community ambualtion    Time 8    Period Weeks    Status Revised    Target Date 09/30/21      PT LONG TERM GOAL #3   Title Patient will stand for 30 min without a significant increase in pain in order to perfrom ADL's    Time 8    Period Weeks    Status  Revised    Target Date 06/06/21                   Plan - 09/04/21 1545     Clinical Impression Statement The patient continues to progress well. She did 4 intervals today. Her HR did not go up at all. We will have to increase the resistance or increase her interval time next. We reviewed her supine exercises today as wella s some  of her sitting exercises. Therapy also perfromed manual therapy to her his to reduce pain. Her hip pain has gone up over the past few days.    Personal Factors and Comorbidities Comorbidity 1;Comorbidity 2    Comorbidities MS, OA    Examination-Activity Limitations Bed Mobility    Examination-Participation Restrictions Meal Prep;Cleaning;Community Activity;Laundry;Shop    Stability/Clinical Decision Making Stable/Uncomplicated    Clinical Decision Making Low    Rehab Potential Good    PT Frequency 1x / week    PT Duration 8 weeks    PT Treatment/Interventions ADLs/Self Care Home Management;Electrical Stimulation;Cryotherapy;Iontophoresis 4mg /ml Dexamethasone;Moist Heat;Traction;DME Instruction;Neuromuscular re-education;Patient/family education;Manual techniques;Passive range of motion;Taping;Therapeutic activities;Therapeutic exercise;Balance training;Gait training;Stair training;Functional mobility training;Dry needling;Ultrasound    PT Next Visit Plan add in endurance exercises; review tolerance to interval training    PT Home Exercise Plan reviewed seated hamstring stretch and LTR. She has done those in the past.    Consulted and Agree with Plan of Care Patient             Patient will benefit from skilled therapeutic intervention in order to improve the following deficits and impairments:  Abnormal gait, Difficulty walking, Decreased range of motion, Decreased mobility, Decreased strength, Postural dysfunction, Pain, Increased muscle spasms, Decreased activity tolerance, Increased fascial restricitons  Visit Diagnosis: Chronic bilateral low back pain without sciatica  Difficulty in walking, not elsewhere classified  Muscle weakness (generalized)     Problem List Patient Active Problem List   Diagnosis Date Noted   Coronary artery disease    Acute diastolic CHF (congestive heart failure) ([place]) 06/15/2021   Demand ischemia ([place]) 06/15/2021   Chest pain 06/14/2021   Elevated  troponin 06/14/2021   Statin intolerance 04/19/2021   Osteoarthritis of right hip 01/17/2021   Status post THR (total hip replacement) 01/17/2021   Chronic bilateral low back pain without sciatica 10/02/2020   Chronic pain syndrome 06/20/2020   Post laminectomy syndrome 06/20/2020   Sjogren's disease ([place]) 05/23/2020   Primary osteoarthritis of both hands 05/23/2020   Primary osteoarthritis of both feet 05/23/2020   Sacroiliitis, not elsewhere classified ([place]) 12/22/2019   Other secondary scoliosis, lumbar region 12/22/2019   Spondylosis without myelopathy or radiculopathy, lumbar region 12/22/2019   Cervical radiculopathy 10/18/2019   Numbness 09/21/2019   Bilateral carpal tunnel syndrome 09/21/2019   High risk medication use 09/21/2019   Memory loss 09/21/2019   Essential hypertension 08/25/2018   Abnormal SPEP 02/19/2018   Hand pain 02/20/2017   Multiple joint pain 02/18/2017   Right elbow pain 12/09/2016   Disturbed cognition 06/25/2016   Trochanteric bursitis of left hip 02/18/2016   Sciatica, right side 10/30/2015   Bilateral arm pain 10/10/2015   Trochanteric bursitis of both hips 08/04/2014   Chronic fatigue 08/04/2014   Urinary frequency 08/04/2014   Abnormality of gait 08/04/2014   Unspecified visual disturbance 01/31/2013   Transient vision disturbance 01/31/2013   Routine general medical examination at a health care facility 11/04/2011   Colon polyps 11/03/2011   Hot flashes, menopausal 10/13/2011  POSTHERPETIC NEURALGIA 10/03/2009   DISPLCMT LUMBAR INTERVERT DISC W/O MYELOPATHY 09/05/2009   RENAL CALCULUS, RECURRENT 09/04/2009   Hyperlipidemia 08/31/2009   Multiple sclerosis ([place]) 08/31/2009   ALLERGIC RHINITIS 08/31/2009    Carney Living, PT 09/05/2021, 8:13 AM  [facility] [place], [place], [address] Phone: [phone]   Fax:  [phone]  Name: OLUWATOYIN BANALES MRN:  [id number] Date of Birth: [birth year]

## 2021-09-11 ENCOUNTER — Other Ambulatory Visit: Payer: Self-pay

## 2021-09-11 ENCOUNTER — Ambulatory Visit (HOSPITAL_BASED_OUTPATIENT_CLINIC_OR_DEPARTMENT_OTHER): Payer: Medicare Other | Admitting: Physical Therapy

## 2021-09-11 ENCOUNTER — Encounter (HOSPITAL_BASED_OUTPATIENT_CLINIC_OR_DEPARTMENT_OTHER): Payer: Self-pay | Admitting: Physical Therapy

## 2021-09-11 DIAGNOSIS — R252 Cramp and spasm: Secondary | ICD-10-CM | POA: Diagnosis not present

## 2021-09-11 DIAGNOSIS — M542 Cervicalgia: Secondary | ICD-10-CM | POA: Diagnosis not present

## 2021-09-11 DIAGNOSIS — G8929 Other chronic pain: Secondary | ICD-10-CM | POA: Diagnosis not present

## 2021-09-11 DIAGNOSIS — M6281 Muscle weakness (generalized): Secondary | ICD-10-CM | POA: Diagnosis not present

## 2021-09-11 DIAGNOSIS — R262 Difficulty in walking, not elsewhere classified: Secondary | ICD-10-CM | POA: Diagnosis not present

## 2021-09-11 DIAGNOSIS — M545 Low back pain, unspecified: Secondary | ICD-10-CM | POA: Diagnosis not present

## 2021-09-12 ENCOUNTER — Telehealth: Payer: Self-pay | Admitting: Internal Medicine

## 2021-09-12 ENCOUNTER — Encounter: Payer: Medicare Other | Admitting: Family

## 2021-09-12 ENCOUNTER — Encounter (HOSPITAL_BASED_OUTPATIENT_CLINIC_OR_DEPARTMENT_OTHER): Payer: Self-pay | Admitting: Physical Therapy

## 2021-09-12 MED ORDER — TICAGRELOR 90 MG PO TABS
90.0000 mg | ORAL_TABLET | Freq: Two times a day (BID) | ORAL | 3 refills | Status: DC
Start: 2021-09-12 — End: 2021-09-27

## 2021-09-12 NOTE — Telephone Encounter (Signed)
Spoke with pt regarding Brilinta prescription. Explained to pt that there is not a generic version of brilinta and we would like to exhaust all efforts to get her Brilinta before discussing the idea of switching to plavix for cost savings. Pt states that she received the first 30 days of brilinta prior to leaving the hospital, after that pt states she received samples from the office. Pt states that her pharmacy says that they do not have her prescription for brilinta. Prescription sent in to pt preferred pharmacy. Pt is going to call pharmacy to see what cost will be moving forward. Pt also has $5 coupon for Brilinta. Advised pt to discuss this with her pharmacy when she calls to see if they will be able to take coupon. Advised pt call back with any further issues getting her prescription. Pt verbalizes understanding.

## 2021-09-12 NOTE — Therapy (Signed)
[facility] [address] [place], [place], [address] Phone: [phone]   Fax:  [phone]  Physical Therapy Treatment  Patient Details  Name: Dawn [name] MRN: [id number] Date of Birth: 11-19-1945 Referring Provider (PT): Dr Arlice Colt   Encounter Date: 09/11/2021   PT End of Session - 09/11/21 1029     Visit Number 20    Number of Visits 23    Date for PT Re-Evaluation 09/30/21    Authorization Type progress note done on visit 15    PT Start Time 1015    PT Stop Time 1055    PT Time Calculation (min) 40 min    Activity Tolerance Patient tolerated treatment well    Behavior During Therapy [facility] for tasks assessed/performed             Past Medical History:  Diagnosis Date   Allergy    Dry eyes    Endometrial polyp    Essential hypertension 08/25/2018   GERD (gastroesophageal reflux disease)    Heart failure ([place])    History of hiatal hernia    History of kidney stones    Hot flashes, menopausal 10/13/2011   Estradiol started    Jaundice as teenager   no problems since   Memory loss 09/21/2019     1/2 of feet numb all the time   Multiple sclerosis ([place]) dx 2001   Neuropathy    Neuropathy    bilateral feet   Osteoarthritis    Sjogren's syndrome ([place]) dx oct 2021   sore muscvles, dry mouth and eyes   Stroke ([place])    Vertigo    Vision abnormalities     Past Surgical History:  Procedure Laterality Date   ABDOMINAL HYSTERECTOMY  2002   partial   colonscopy  2011   CORONARY STENT INTERVENTION N/A 07/18/2021   Procedure: CORONARY STENT INTERVENTION;  Surgeon: Jettie Booze, MD;  Location: [facility];  Service: Cardiovascular;  Laterality: N/A;   DILATATION & CURETTAGE/HYSTEROSCOPY WITH MYOSURE N/A 06/08/2020   Procedure: DILATATION & CURETTAGE/HYSTEROSCOPY/Polypectomy WITH MYOSURE;  Surgeon: Armandina Stammer, DO;  Location: [facility];  Service: Gynecology;   Laterality: N/A;   LEFT HEART CATH AND CORONARY ANGIOGRAPHY N/A 07/18/2021   Procedure: LEFT HEART CATH AND CORONARY ANGIOGRAPHY;  Surgeon: Jettie Booze, MD;  Location: [facility];  Service: Cardiovascular;  Laterality: N/A;   LUMBAR FUSION  2001   TOTAL HIP ARTHROPLASTY Right 01/17/2021   Procedure: TOTAL HIP ARTHROPLASTY ANTERIOR APPROACH;  Surgeon: Rod Can, MD;  Location: [facility];  Service: Orthopedics;  Laterality: Right;   UPPER GI ENDOSCOPY  yrs ago    There were no vitals filed for this visit.   Subjective Assessment - 09/11/21 1027     Subjective The atient reports she is in her usual state of soreness in her low back and hips. Sh ehas felt pretty good after her PT sessions. Her left hip has been hurting her worse    Pertinent History MS    How long can you stand comfortably? hurts as soon as she stands. Has to shift frequently    How long can you walk comfortably? limited community ambualtion    Diagnostic tests Nothing recent    Currently in Pain? Yes    Pain Score 6     Pain Location Hip    Pain Orientation Left    Pain Descriptors / Indicators Aching    Pain Type Chronic  pain    Pain Onset More than a month ago    Pain Frequency Intermittent    Aggravating Factors  standing and wlking    Pain Relieving Factors rest    Effect of Pain on Daily Activities difficulty perfroming daily tasks    Multiple Pain Sites No                Supine march 2x20  Calmshell green 2x20   PPT 3x10  Supine ball squeeze 3x10 pink ball      Wand flexion 3x10 2lb  Band er 3x15 red  Band horizontal abduction 3x10    Seated LAQ 3x10 green   seated march 2x10    Intervals 2 min x4 intervals L3 on nu-step      Manual: roller to lateral hip and LAD grade III and IV                          PT Education - 09/11/21 1029     Education Details reviewed home exercises    Person(s) Educated Patient    Methods  Explanation;Demonstration;Tactile cues;Verbal cues    Comprehension Verbalized understanding;Returned demonstration;Verbal cues required;Tactile cues required              PT Short Term Goals - 08/05/21 1055       PT SHORT TERM GOAL #1   Title Patient will report a 50% reduction in lower back pain    Baseline 4/10 pain today    Time 4    Period Weeks    Status On-going    Target Date 09/02/21      PT SHORT TERM GOAL #2   Title Patient will ambaulte 300' with no significant spike in B/P or HR    Time 4    Period Weeks    Status Revised    Target Date 09/02/21      PT SHORT TERM GOAL #3   Title Patient increase right hip flexion and abducion strength by 5 lbs    Time 4    Period Weeks    Status Revised    Target Date 09/02/21      PT SHORT TERM GOAL #4   Title Patient will be indepdnent with basic endruance and right hip strengthening program    Time 4    Period Weeks    Status Revised    Target Date 09/02/21               PT Long Term Goals - 08/05/21 1057       PT LONG TERM GOAL #1   Title Patient will return to [facility] with a full program for her hip/ lower back/ and endurance    Time 8    Period Weeks    Status Revised    Target Date 09/30/21      PT LONG TERM GOAL #2   Title Patient will ambualte 600' the 6 min walk test in order to improve community ambualtion    Time 8    Period Weeks    Status Revised    Target Date 09/30/21      PT LONG TERM GOAL #3   Title Patient will stand for 30 min without a significant increase in pain in order to perfrom ADL's    Time 8    Period Weeks    Status Revised    Target Date 06/06/21  Plan - 09/11/21 1045     Clinical Impression Statement Patient was having more pain in her left hip today. Therapy focused on manual therapy to the hip. She appears to have more rotation in her hips with LTR. She was able to complete 4 2 min intervals on the nu-step. Her HR reached only 71  with a baseline of 68. her B/P was well controlled during the session. She tolerated supine exercises well> She feels like these are better for her back at this time. Therapy will continue to progress as tolerated.    Personal Factors and Comorbidities Comorbidity 1;Comorbidity 2    Comorbidities MS, OA    Examination-Activity Limitations Bed Mobility    Examination-Participation Restrictions Meal Prep;Cleaning;Community Activity;Laundry;Shop    Stability/Clinical Decision Making Stable/Uncomplicated    Clinical Decision Making Low    Rehab Potential Good    PT Frequency 1x / week    PT Duration 8 weeks    PT Treatment/Interventions ADLs/Self Care Home Management;Electrical Stimulation;Cryotherapy;Iontophoresis 4mg /ml Dexamethasone;Moist Heat;Traction;DME Instruction;Neuromuscular re-education;Patient/family education;Manual techniques;Passive range of motion;Taping;Therapeutic activities;Therapeutic exercise;Balance training;Gait training;Stair training;Functional mobility training;Dry needling;Ultrasound    PT Next Visit Plan add in endurance exercises; review tolerance to interval training    PT Home Exercise Plan reviewed seated hamstring stretch and LTR. She has done those in the past.    Consulted and Agree with Plan of Care Patient             Patient will benefit from skilled therapeutic intervention in order to improve the following deficits and impairments:  Abnormal gait, Difficulty walking, Decreased range of motion, Decreased mobility, Decreased strength, Postural dysfunction, Pain, Increased muscle spasms, Decreased activity tolerance, Increased fascial restricitons  Visit Diagnosis: Chronic bilateral low back pain without sciatica  Difficulty in walking, not elsewhere classified  Muscle weakness (generalized)  Cervicalgia  Cramp and spasm     Problem List Patient Active Problem List   Diagnosis Date Noted   Coronary artery disease    Acute diastolic CHF  (congestive heart failure) ([place]) 06/15/2021   Demand ischemia ([place]) 06/15/2021   Chest pain 06/14/2021   Elevated troponin 06/14/2021   Statin intolerance 04/19/2021   Osteoarthritis of right hip 01/17/2021   Status post THR (total hip replacement) 01/17/2021   Chronic bilateral low back pain without sciatica 10/02/2020   Chronic pain syndrome 06/20/2020   Post laminectomy syndrome 06/20/2020   Sjogren's disease ([place]) 05/23/2020   Primary osteoarthritis of both hands 05/23/2020   Primary osteoarthritis of both feet 05/23/2020   Sacroiliitis, not elsewhere classified ([place]) 12/22/2019   Other secondary scoliosis, lumbar region 12/22/2019   Spondylosis without myelopathy or radiculopathy, lumbar region 12/22/2019   Cervical radiculopathy 10/18/2019   Numbness 09/21/2019   Bilateral carpal tunnel syndrome 09/21/2019   High risk medication use 09/21/2019   Memory loss 09/21/2019   Essential hypertension 08/25/2018   Abnormal SPEP 02/19/2018   Hand pain 02/20/2017   Multiple joint pain 02/18/2017   Right elbow pain 12/09/2016   Disturbed cognition 06/25/2016   Trochanteric bursitis of left hip 02/18/2016   Sciatica, right side 10/30/2015   Bilateral arm pain 10/10/2015   Trochanteric bursitis of both hips 08/04/2014   Chronic fatigue 08/04/2014   Urinary frequency 08/04/2014   Abnormality of gait 08/04/2014   Unspecified visual disturbance 01/31/2013   Transient vision disturbance 01/31/2013   Routine general medical examination at a health care facility 11/04/2011   Colon polyps 11/03/2011   Hot flashes, menopausal 10/13/2011   POSTHERPETIC NEURALGIA 10/03/2009  DISPLCMT LUMBAR INTERVERT DISC W/O MYELOPATHY 09/05/2009   RENAL CALCULUS, RECURRENT 09/04/2009   Hyperlipidemia 08/31/2009   Multiple sclerosis ([place]) 08/31/2009   ALLERGIC RHINITIS 08/31/2009    Carney Living, PT 09/12/2021, 8:08 AM  [facility] [place], [place], [address] Phone: [phone]   Fax:  [phone]  Name: Dawn [name] MRN: [id number] Date of Birth: September 22, 1945

## 2021-09-12 NOTE — Telephone Encounter (Signed)
°*  STAT* If patient is at the pharmacy, call can be transferred to refill team.   1. Which medications need to be refilled? (please list name of each medication and dose if known) ticagrelor (BRILINTA) 90 MG TABS tablet  2. Which pharmacy/location (including street and city if local pharmacy) is medication to be sent to? [id number] - [place], [place] [address]  3. Do they need a 30 day or 90 day supply? 30  Patient would like to know if there is a generic brand to Brilinta due to cost. Also would like to know if she would be able to use coupon as it says commercially insured patients. Patient says she has [place] [employer]. Please call to discuss

## 2021-09-17 NOTE — Telephone Encounter (Signed)
Pt called in and stated the pharmacy tried several times to run the coupon though but the Brilinta was still $500.00.  pt stated she can not afford that.    Best number [phone]

## 2021-09-17 NOTE — Telephone Encounter (Signed)
Called patient back- she states that she has tried to get Brilinta with the cost savings card, but it would still $500- patient states she can not do this. I did advise her to try patient assistance forms, she did receive some of these at the hospital but only has one page of it now. I did advise I could give her samples Brilinta 90 mg tablets with the patient assistance forms included. Will route to primary nurse as Juluis Rainier.   Patient will come pick up samples, and try this, if not we may have to switch.   Thanks!

## 2021-09-18 NOTE — Telephone Encounter (Signed)
Patient assistance paper work for [employer] received. Paperwork has been filled out, signed, and faxed to [place] patient [place]. Will await response.  ?

## 2021-09-19 ENCOUNTER — Other Ambulatory Visit: Payer: Self-pay

## 2021-09-20 ENCOUNTER — Ambulatory Visit (HOSPITAL_BASED_OUTPATIENT_CLINIC_OR_DEPARTMENT_OTHER): Payer: Medicare Other | Attending: Neurology | Admitting: Physical Therapy

## 2021-09-20 ENCOUNTER — Encounter (HOSPITAL_BASED_OUTPATIENT_CLINIC_OR_DEPARTMENT_OTHER): Payer: Self-pay | Admitting: Physical Therapy

## 2021-09-20 ENCOUNTER — Other Ambulatory Visit: Payer: Self-pay

## 2021-09-20 DIAGNOSIS — G8929 Other chronic pain: Secondary | ICD-10-CM | POA: Diagnosis not present

## 2021-09-20 DIAGNOSIS — M545 Low back pain, unspecified: Secondary | ICD-10-CM | POA: Insufficient documentation

## 2021-09-20 DIAGNOSIS — R262 Difficulty in walking, not elsewhere classified: Secondary | ICD-10-CM | POA: Insufficient documentation

## 2021-09-20 DIAGNOSIS — R252 Cramp and spasm: Secondary | ICD-10-CM | POA: Insufficient documentation

## 2021-09-20 DIAGNOSIS — M6281 Muscle weakness (generalized): Secondary | ICD-10-CM | POA: Diagnosis not present

## 2021-09-20 DIAGNOSIS — M542 Cervicalgia: Secondary | ICD-10-CM | POA: Diagnosis not present

## 2021-09-20 NOTE — Therapy (Signed)
[facility] [address] [place], [place], [address] Phone: [phone]   Fax:  [phone]  Physical Therapy Treatment  Patient Details  Name: Dawn [name] MRN: [id number] Date of Birth: 09-01-1945 Referring Provider (PT): Dr Arlice Colt   Encounter Date: 09/20/2021   PT End of Session - 09/20/21 1002     Visit Number 21    Number of Visits 23    Date for PT Re-Evaluation 09/30/21    Authorization Type progress note done on visit 15    PT Start Time 0930    PT Stop Time 1012    PT Time Calculation (min) 42 min    Activity Tolerance Patient tolerated treatment well    Behavior During Therapy [facility] for tasks assessed/performed             Past Medical History:  Diagnosis Date   Allergy    Dry eyes    Endometrial polyp    Essential hypertension 08/25/2018   GERD (gastroesophageal reflux disease)    Heart failure ([place])    History of hiatal hernia    History of kidney stones    Hot flashes, menopausal 10/13/2011   Estradiol started    Jaundice as teenager   no problems since   Memory loss 09/21/2019     1/2 of feet numb all the time   Multiple sclerosis ([place]) dx 2001   Neuropathy    Neuropathy    bilateral feet   Osteoarthritis    Sjogren's syndrome ([place]) dx oct 2021   sore muscvles, dry mouth and eyes   Stroke ([place])    Vertigo    Vision abnormalities     Past Surgical History:  Procedure Laterality Date   ABDOMINAL HYSTERECTOMY  2002   partial   colonscopy  2011   CORONARY STENT INTERVENTION N/A 07/18/2021   Procedure: CORONARY STENT INTERVENTION;  Surgeon: Jettie Booze, MD;  Location: [place] [facility];  Service: Cardiovascular;  Laterality: N/A;   DILATATION & CURETTAGE/HYSTEROSCOPY WITH MYOSURE N/A 06/08/2020   Procedure: DILATATION & CURETTAGE/HYSTEROSCOPY/Polypectomy WITH MYOSURE;  Surgeon: Armandina Stammer, DO;  Location: [place];  Service: Gynecology;   Laterality: N/A;   LEFT HEART CATH AND CORONARY ANGIOGRAPHY N/A 07/18/2021   Procedure: LEFT HEART CATH AND CORONARY ANGIOGRAPHY;  Surgeon: Jettie Booze, MD;  Location: [place] [facility];  Service: Cardiovascular;  Laterality: N/A;   LUMBAR FUSION  2001   TOTAL HIP ARTHROPLASTY Right 01/17/2021   Procedure: TOTAL HIP ARTHROPLASTY ANTERIOR APPROACH;  Surgeon: Rod Can, MD;  Location: [facility];  Service: Orthopedics;  Laterality: Right;   UPPER GI ENDOSCOPY  yrs ago    There were no vitals filed for this visit.   Subjective Assessment - 09/20/21 0953     Subjective The patient reports her hips has been sore. She feels like her endurance is better. her fatigue is better. She still has days when she is exhausted.    How long can you stand comfortably? hurts as soon as she stands. Has to shift frequently    How long can you walk comfortably? limited community ambualtion    Diagnostic tests Nothing recent    Patient Stated Goals to go home    Currently in Pain? Yes    Pain Score 6     Pain Location Hip    Pain Orientation Left;Right    Pain Descriptors / Indicators Aching    Pain Type Chronic pain  Pain Radiating Towards more psoterior    Pain Onset More than a month ago    Pain Frequency Intermittent    Aggravating Factors  standing and walking    Pain Relieving Factors rest    Effect of Pain on Daily Activities difficulty perfroming ADL's    Multiple Pain Sites No                Nu-step: 4 2 min intervals HR remained at 66 min   Suoine clamshell 3x20 green  Supine march 3x15  Supine PPT 3x15   LTR 2x20  Pirifromis stretch left is full right she can just bring her knee to the left side   Manual: roller to bilateral hips. Patient reported after she reported no pain.                            PT Education - 09/20/21 1001     Education Details reviewed use of thera-cane    Person(s) Educated Patient    Methods Explanation;Tactile  cues;Verbal cues;Demonstration    Comprehension Verbalized understanding;Returned demonstration;Verbal cues required;Tactile cues required              PT Short Term Goals - 09/20/21 1123       PT SHORT TERM GOAL #1   Title Patient will report a 50% reduction in lower back pain    Baseline no low back pain    Time 4    Period Weeks    Status Achieved    Target Date 09/02/21      PT SHORT TERM GOAL #2   Title Patient will ambaulte 300' with no significant spike in B/P or HR    Baseline None at this time    Time 4    Period Weeks    Status Achieved    Target Date 09/02/21      PT SHORT TERM GOAL #3   Title Patient increase right hip flexion and abducion strength by 5 lbs    Baseline 107 with mild pain    Time 4    Period Weeks    Status On-going    Target Date 09/02/21      PT SHORT TERM GOAL #4   Title Patient will be indepdnent with basic endruance and right hip strengthening program    Baseline to 60 degres today    Time 4    Period Weeks    Status Achieved               PT Long Term Goals - 08/05/21 1057       PT LONG TERM GOAL #1   Title Patient will return to [facility] with a full program for her hip/ lower back/ and endurance    Time 8    Period Weeks    Status Revised    Target Date 09/30/21      PT LONG TERM GOAL #2   Title Patient will ambualte 600' the 6 min walk test in order to improve community ambualtion    Time 8    Period Weeks    Status Revised    Target Date 09/30/21      PT LONG TERM GOAL #3   Title Patient will stand for 30 min without a significant increase in pain in order to perfrom ADL's    Time 8    Period Weeks    Status Revised    Target Date 06/06/21  Plan - 09/20/21 1002     Clinical Impression Statement Therapy reviewed use of the thera-cane for trigger point release. She is having some difficulty controlling the tennis ball. We advanced her intervals to a level 4. We still have not  been able to change her heart rate much. it was around 66 through all the intervals. We will continue to work on improving her cardiovascular endurance.    Personal Factors and Comorbidities Comorbidity 1;Comorbidity 2    Comorbidities MS, OA    Examination-Activity Limitations Bed Mobility    Examination-Participation Restrictions Meal Prep;Cleaning;Community Activity;Laundry;Shop    Stability/Clinical Decision Making Stable/Uncomplicated    Clinical Decision Making Low    Rehab Potential Good    PT Frequency 1x / week    PT Duration 8 weeks    PT Treatment/Interventions ADLs/Self Care Home Management;Electrical Stimulation;Cryotherapy;Iontophoresis 4mg /ml Dexamethasone;Moist Heat;Traction;DME Instruction;Neuromuscular re-education;Patient/family education;Manual techniques;Passive range of motion;Taping;Therapeutic activities;Therapeutic exercise;Balance training;Gait training;Stair training;Functional mobility training;Dry needling;Ultrasound    PT Next Visit Plan add in endurance exercises; review tolerance to interval training    PT Home Exercise Plan reviewed seated hamstring stretch and LTR. She has done those in the past.    Consulted and Agree with Plan of Care Patient             Patient will benefit from skilled therapeutic intervention in order to improve the following deficits and impairments:  Abnormal gait, Difficulty walking, Decreased range of motion, Decreased mobility, Decreased strength, Postural dysfunction, Pain, Increased muscle spasms, Decreased activity tolerance, Increased fascial restricitons  Visit Diagnosis: Chronic bilateral low back pain without sciatica  Difficulty in walking, not elsewhere classified  Muscle weakness (generalized)  Cervicalgia  Cramp and spasm     Problem List Patient Active Problem List   Diagnosis Date Noted   Coronary artery disease    Acute diastolic CHF (congestive heart failure) ([place]) 06/15/2021   Demand ischemia ([place])  06/15/2021   Chest pain 06/14/2021   Elevated troponin 06/14/2021   Statin intolerance 04/19/2021   Osteoarthritis of right hip 01/17/2021   Status post THR (total hip replacement) 01/17/2021   Chronic bilateral low back pain without sciatica 10/02/2020   Chronic pain syndrome 06/20/2020   Post laminectomy syndrome 06/20/2020   Sjogren's disease ([place]) 05/23/2020   Primary osteoarthritis of both hands 05/23/2020   Primary osteoarthritis of both feet 05/23/2020   Sacroiliitis, not elsewhere classified ([place]) 12/22/2019   Other secondary scoliosis, lumbar region 12/22/2019   Spondylosis without myelopathy or radiculopathy, lumbar region 12/22/2019   Cervical radiculopathy 10/18/2019   Numbness 09/21/2019   Bilateral carpal tunnel syndrome 09/21/2019   High risk medication use 09/21/2019   Memory loss 09/21/2019   Essential hypertension 08/25/2018   Abnormal SPEP 02/19/2018   Hand pain 02/20/2017   Multiple joint pain 02/18/2017   Right elbow pain 12/09/2016   Disturbed cognition 06/25/2016   Trochanteric bursitis of left hip 02/18/2016   Sciatica, right side 10/30/2015   Bilateral arm pain 10/10/2015   Trochanteric bursitis of both hips 08/04/2014   Chronic fatigue 08/04/2014   Urinary frequency 08/04/2014   Abnormality of gait 08/04/2014   Unspecified visual disturbance 01/31/2013   Transient vision disturbance 01/31/2013   Routine general medical examination at a health care facility 11/04/2011   Colon polyps 11/03/2011   Hot flashes, menopausal 10/13/2011   POSTHERPETIC NEURALGIA 10/03/2009   DISPLCMT LUMBAR INTERVERT DISC W/O MYELOPATHY 09/05/2009   RENAL CALCULUS, RECURRENT 09/04/2009   Hyperlipidemia 08/31/2009   Multiple sclerosis ([place]) 08/31/2009  ALLERGIC RHINITIS 08/31/2009    Carney Living, PT 09/20/2021, 11:34 AM  [facility] [place], [place], [address] Phone: [phone]   Fax:   [phone]  Name: WINFRED REDEL MRN: [id number] Date of Birth: 05-19-1946

## 2021-09-24 ENCOUNTER — Other Ambulatory Visit: Payer: Self-pay | Admitting: Neurology

## 2021-09-25 ENCOUNTER — Encounter (HOSPITAL_BASED_OUTPATIENT_CLINIC_OR_DEPARTMENT_OTHER): Payer: Self-pay | Admitting: Physical Therapy

## 2021-09-25 ENCOUNTER — Other Ambulatory Visit: Payer: Self-pay

## 2021-09-25 ENCOUNTER — Ambulatory Visit (HOSPITAL_BASED_OUTPATIENT_CLINIC_OR_DEPARTMENT_OTHER): Payer: Medicare Other | Admitting: Physical Therapy

## 2021-09-25 DIAGNOSIS — M6281 Muscle weakness (generalized): Secondary | ICD-10-CM | POA: Diagnosis not present

## 2021-09-25 DIAGNOSIS — M545 Low back pain, unspecified: Secondary | ICD-10-CM | POA: Diagnosis not present

## 2021-09-25 DIAGNOSIS — G8929 Other chronic pain: Secondary | ICD-10-CM | POA: Diagnosis not present

## 2021-09-25 DIAGNOSIS — R262 Difficulty in walking, not elsewhere classified: Secondary | ICD-10-CM

## 2021-09-25 DIAGNOSIS — R252 Cramp and spasm: Secondary | ICD-10-CM | POA: Diagnosis not present

## 2021-09-25 DIAGNOSIS — M542 Cervicalgia: Secondary | ICD-10-CM

## 2021-09-25 NOTE — Therapy (Signed)
[facility] [address] [place], [place], [address] Phone: [phone]   Fax:  [phone]  Physical Therapy Treatment  Patient Details  Name: Dawn [name] MRN: [id number] Date of Birth: 04-05-1946 Referring Provider (PT): Dr Arlice Colt   Encounter Date: 09/25/2021   PT End of Session - 09/25/21 1007     Visit Number 22    Number of Visits 23    Date for PT Re-Evaluation 09/30/21    Authorization Type progress note done on visit 15    PT Start Time 0930    PT Stop Time 1014    PT Time Calculation (min) 44 min    Activity Tolerance Patient tolerated treatment well    Behavior During Therapy [facility] for tasks assessed/performed             Past Medical History:  Diagnosis Date   Allergy    Dry eyes    Endometrial polyp    Essential hypertension 08/25/2018   GERD (gastroesophageal reflux disease)    Heart failure ([place])    History of hiatal hernia    History of kidney stones    Hot flashes, menopausal 10/13/2011   Estradiol started    Jaundice as teenager   no problems since   Memory loss 09/21/2019     1/2 of feet numb all the time   Multiple sclerosis ([place]) dx 2001   Neuropathy    Neuropathy    bilateral feet   Osteoarthritis    Sjogren's syndrome ([place]) dx oct 2021   sore muscvles, dry mouth and eyes   Stroke ([place])    Vertigo    Vision abnormalities     Past Surgical History:  Procedure Laterality Date   ABDOMINAL HYSTERECTOMY  2002   partial   colonscopy  2011   CORONARY STENT INTERVENTION N/A 07/18/2021   Procedure: CORONARY STENT INTERVENTION;  Surgeon: Jettie Booze, MD;  Location: [place] [facility];  Service: Cardiovascular;  Laterality: N/A;   DILATATION & CURETTAGE/HYSTEROSCOPY WITH MYOSURE N/A 06/08/2020   Procedure: DILATATION & CURETTAGE/HYSTEROSCOPY/Polypectomy WITH MYOSURE;  Surgeon: Armandina Stammer, DO;  Location: [place];  Service: Gynecology;   Laterality: N/A;   LEFT HEART CATH AND CORONARY ANGIOGRAPHY N/A 07/18/2021   Procedure: LEFT HEART CATH AND CORONARY ANGIOGRAPHY;  Surgeon: Jettie Booze, MD;  Location: [place] [facility];  Service: Cardiovascular;  Laterality: N/A;   LUMBAR FUSION  2001   TOTAL HIP ARTHROPLASTY Right 01/17/2021   Procedure: TOTAL HIP ARTHROPLASTY ANTERIOR APPROACH;  Surgeon: Rod Can, MD;  [facility];  Service: Orthopedics;  Laterality: Right;   UPPER GI ENDOSCOPY  yrs ago    There were no vitals filed for this visit.   Subjective Assessment - 09/25/21 0946     Subjective The patient reports she has been somewhat more short of breath over the past few days. She feels like it is the medication.    Pertinent History MS    How long can you stand comfortably? hurts as soon as she stands. Has to shift frequently    How long can you walk comfortably? limited community ambualtion    Diagnostic tests Nothing recent    Patient Stated Goals to go home    Currently in Pain? Yes    Pain Score 4     Pain Location Hip    Pain Orientation Right;Left    Pain Descriptors / Indicators Aching    Pain Type Chronic pain  Pain Onset More than a month ago    Pain Frequency Intermittent    Aggravating Factors  standing and walking    Pain Relieving Factors rest    Effect of Pain on Daily Activities difficulty perfroming ADL's    Multiple Pain Sites No                    Nu-step: 4 2 min intervals HR remained increased to 74. Her base line was 65. She put more of effort into going faster.    Suoine clamshell 3x20 green  Supine march 3x15 2lbs weight Supine PPT 3x15  Wand flexion 2x10   Seated LAQ 3x15 2lb    LTR 2x20                             PT Education - 09/25/21 0953     Education Details reviewed HR with patient    Person(s) Educated Patient    Methods Explanation;Demonstration;Tactile cues;Verbal cues    Comprehension Verbalized  understanding;Returned demonstration;Verbal cues required;Tactile cues required              PT Short Term Goals - 09/20/21 1123       PT SHORT TERM GOAL #1   Title Patient will report a 50% reduction in lower back pain    Baseline no low back pain    Time 4    Period Weeks    Status Achieved    Target Date 09/02/21      PT SHORT TERM GOAL #2   Title Patient will ambaulte 300' with no significant spike in B/P or HR    Baseline None at this time    Time 4    Period Weeks    Status Achieved    Target Date 09/02/21      PT SHORT TERM GOAL #3   Title Patient increase right hip flexion and abducion strength by 5 lbs    Baseline 107 with mild pain    Time 4    Period Weeks    Status On-going    Target Date 09/02/21      PT SHORT TERM GOAL #4   Title Patient will be indepdnent with basic endruance and right hip strengthening program    Baseline to 60 degres today    Time 4    Period Weeks    Status Achieved               PT Long Term Goals - 08/05/21 1057       PT LONG TERM GOAL #1   Title Patient will return to [facility] with a full program for her hip/ lower back/ and endurance    Time 8    Period Weeks    Status Revised    Target Date 09/30/21      PT LONG TERM GOAL #2   Title Patient will ambualte 600' the 6 min walk test in order to improve community ambualtion    Time 8    Period Weeks    Status Revised    Target Date 09/30/21      PT LONG TERM GOAL #3   Title Patient will stand for 30 min without a significant increase in pain in order to perfrom ADL's    Time 8    Period Weeks    Status Revised    Target Date 06/06/21  Plan - 09/25/21 1258     Clinical Impression Statement The patients HR increased to 74 with exercises today. We have been working on progresisng her intervals to increase her HR. After 3 min it was back down to 65 bpm which was baseline. Therapy advanced her exercises today. She perfored leg  exercises with weights. She had some difficulty with her right leg. We will continue to advance as tolerated. We will perform a re-assessment next visit.    Personal Factors and Comorbidities Comorbidity 1;Comorbidity 2    Comorbidities MS, OA    Examination-Activity Limitations Bed Mobility    Examination-Participation Restrictions Meal Prep;Cleaning;Community Activity;Laundry;Shop    Stability/Clinical Decision Making Stable/Uncomplicated    Clinical Decision Making Low    Rehab Potential Good    PT Frequency 1x / week    PT Duration 8 weeks    PT Treatment/Interventions ADLs/Self Care Home Management;Electrical Stimulation;Cryotherapy;Iontophoresis '4mg'$ /ml Dexamethasone;Moist Heat;Traction;DME Instruction;Neuromuscular re-education;Patient/family education;Manual techniques;Passive range of motion;Taping;Therapeutic activities;Therapeutic exercise;Balance training;Gait training;Stair training;Functional mobility training;Dry needling;Ultrasound    PT Next Visit Plan add in endurance exercises; review tolerance to interval training    PT Home Exercise Plan reviewed seated hamstring stretch and LTR. She has done those in the past.    Consulted and Agree with Plan of Care Patient             Patient will benefit from skilled therapeutic intervention in order to improve the following deficits and impairments:  Abnormal gait, Difficulty walking, Decreased range of motion, Decreased mobility, Decreased strength, Postural dysfunction, Pain, Increased muscle spasms, Decreased activity tolerance, Increased fascial restricitons  Visit Diagnosis: Chronic bilateral low back pain without sciatica  Difficulty in walking, not elsewhere classified  Muscle weakness (generalized)  Cervicalgia  Cramp and spasm     Problem List Patient Active Problem List   Diagnosis Date Noted   Coronary artery disease    Acute diastolic CHF (congestive heart failure) ([place]) 06/15/2021   Demand ischemia ([place])  06/15/2021   Chest pain 06/14/2021   Elevated troponin 06/14/2021   Statin intolerance 04/19/2021   Osteoarthritis of right hip 01/17/2021   Status post THR (total hip replacement) 01/17/2021   Chronic bilateral low back pain without sciatica 10/02/2020   Chronic pain syndrome 06/20/2020   Post laminectomy syndrome 06/20/2020   Sjogren's disease ([place]) 05/23/2020   Primary osteoarthritis of both hands 05/23/2020   Primary osteoarthritis of both feet 05/23/2020   Sacroiliitis, not elsewhere classified ([place]) 12/22/2019   Other secondary scoliosis, lumbar region 12/22/2019   Spondylosis without myelopathy or radiculopathy, lumbar region 12/22/2019   Cervical radiculopathy 10/18/2019   Numbness 09/21/2019   Bilateral carpal tunnel syndrome 09/21/2019   High risk medication use 09/21/2019   Memory loss 09/21/2019   Essential hypertension 08/25/2018   Abnormal SPEP 02/19/2018   Hand pain 02/20/2017   Multiple joint pain 02/18/2017   Right elbow pain 12/09/2016   Disturbed cognition 06/25/2016   Trochanteric bursitis of left hip 02/18/2016   Sciatica, right side 10/30/2015   Bilateral arm pain 10/10/2015   Trochanteric bursitis of both hips 08/04/2014   Chronic fatigue 08/04/2014   Urinary frequency 08/04/2014   Abnormality of gait 08/04/2014   Unspecified visual disturbance 01/31/2013   Transient vision disturbance 01/31/2013   Routine general medical examination at a health care facility 11/04/2011   Colon polyps 11/03/2011   Hot flashes, menopausal 10/13/2011   POSTHERPETIC NEURALGIA 10/03/2009   DISPLCMT LUMBAR INTERVERT DISC W/O MYELOPATHY 09/05/2009   RENAL CALCULUS, RECURRENT 09/04/2009  Hyperlipidemia 08/31/2009   Multiple sclerosis ([place]) 08/31/2009   ALLERGIC RHINITIS 08/31/2009    Carney Living, PT 09/25/2021, 1:02 PM  [facility] [address] [place], [place], [address] Phone: [phone]   Fax:   [phone]  Name: DANAJAH BIRDSELL MRN: [id number] Date of Birth: Mar 12, 1946

## 2021-09-27 ENCOUNTER — Other Ambulatory Visit: Payer: Self-pay

## 2021-09-27 ENCOUNTER — Ambulatory Visit (INDEPENDENT_AMBULATORY_CARE_PROVIDER_SITE_OTHER): Payer: Medicare Other | Admitting: Internal Medicine

## 2021-09-27 ENCOUNTER — Encounter: Payer: Self-pay | Admitting: Internal Medicine

## 2021-09-27 VITALS — BP 128/80 | HR 67 | Ht 62.0 in | Wt 169.0 lb

## 2021-09-27 DIAGNOSIS — I2510 Atherosclerotic heart disease of native coronary artery without angina pectoris: Secondary | ICD-10-CM | POA: Diagnosis not present

## 2021-09-27 MED ORDER — CLOPIDOGREL BISULFATE 75 MG PO TABS: 75.0000 mg | ORAL_TABLET | Freq: Every day | ORAL | 5 refills | Status: DC

## 2021-09-27 MED ORDER — CLOPIDOGREL BISULFATE 75 MG PO TABS: 300.0000 mg | ORAL_TABLET | Freq: Every day | ORAL | 0 refills | Status: DC

## 2021-09-27 MED ORDER — PANTOPRAZOLE SODIUM 40 MG PO TBEC: 40.0000 mg | DELAYED_RELEASE_TABLET | Freq: Every day | ORAL | 11 refills | Status: DC

## 2021-09-27 NOTE — Telephone Encounter (Signed)
Supple, Megan E, RPH-CPP  You 9 minutes ago (3:42 PM)  ? ?'40mg'$  daily, if this does not control her symptoms she should follow up with MD who initially prescribed her PPI.   ? ?You  Supple, Megan E, RPH-CPP 45 minutes ago (3:06 PM)  ? ?What dosage of Protonix do you recommend? Thank you!   ? ?Supple, Megan E, RPH-CPP  You 52 minutes ago (3:00 PM)  ? ?Plavix and omeprazole interact and make Plavix less effective. Recommend changing omeprazole to pantoprazole to minimize the drug interaction.   ? ?Returned call to patient and made patient aware of all recommendations. Patient will stop Omeprazole and start Protonix '40mg'$  once daily. Protonix '40mg'$  daily sent to patient preferred pharmacy. Patient is also aware of instructions for plavix dosing and verbalized understanding of all instructions.  ?

## 2021-09-27 NOTE — Progress Notes (Signed)
Cardiology Office Note:    Date:  09/27/2021   ID:  Dawn [name], DOB January 12, 1946, MRN [id number]  PCP:  Sandrea Hughs, NP   [facility] Providers Cardiologist:  Janina Mayo, MD     Referring MD: Sandrea Hughs, NP   No chief complaint on file. Hospital Follow Up  History of Present Illness:   Initial HPI Dawn [name] is a 76 y.o. female with a hx  below, CVA on aspirin, HLD,  MS, hx referral for hospitalization with NSTEMI, hypertensive emergency  She got tight around her chest. She was short of breath. She had arm cramping. Her symptoms were abrupt. It was later in the day and she was doing activity. She has MS and felt an "MS hug" which can be a tight feeling in her chest. Prior to that she she had no orthopnea, PND, or LE edema.  She reported her blood pressure was high and it was SBP 210 when she checked it. She went to urgent care. She went to the ED. In the ED she reported above. She had EKG with no ischemic changes. Her troponin was below. She had systolic Bps [id number].  She and an echo with normal LV function, mild LVH, no valve dx. RA pressure of 3. Don't see a BNP. Her chest xray was unremarkable. She was hypertensive and her coreg was increased and hydralazine was added. Admission sounds like hypertensive emergency. She was thought to have diastolic CHF with "new onset grade I diastolic dysfunction".  06/15/2021 Trop 182-->322->281->365->387->322  EKG 06/18/2021-NSR   Today, she feels light headed and tired. She hasn't had pain. When she does activity she feels palpitations. No chest pressure with activity. No shortness of breath with activity. No hospital admission for heart failure in the past. She smoked for 11 years and quit in 1987. No stress test and no LHC.  Her father died of MI at 68. Her brother died of heart attack at 55. Sister and grandmother had rheumatoid arthritis. She was diagnosed with MS in 2001 and it is progressive.  She was on zetia but  stopped b/c of muscle aches. She was on a statin years ago. She had full body cramping.   06/15/2021 LDL 142 HDL 44 TC 213 A1c 5.4 Crt 0.7 TSH 4.4   Interim Hx 08/02/2021 [name] underwent CTA  that showed a significant functional lesion in the RCA. She underwent LHC that showed prox to mid 99% RCA lesion with L-R collaterals. She underwent successful PCI. She was started on aspirin and brilintia. She feels tired. She did feel some shortness of breath after starting the brillinta. Her chest tightness has resolved. Her right radial site is healed. She feels some light headedness but HR and BP are fine. She is on the [place] diet.  Interim Hx 09/27/2021 Nothing has changed. She said there was an appointment made for today, plan was 6 month FU. She is out of brillinta with only three day supply. Still has som SOB with it. Her symptoms are not like they were prior to her PCI.  Brillinta cost is high and she would like an alternative   Cardiology Studies TTE 06/15/2021 1. Left ventricular ejection fraction, by estimation, is 60 to 65%. The  left ventricle has normal function. The left ventricle has no regional  wall motion abnormalities. There is mild left ventricular hypertrophy.  Left ventricular diastolic parameters  are consistent with Grade I diastolic dysfunction (impaired relaxation).   2. Right ventricular  systolic function is normal. The right ventricular  size is normal. Tricuspid regurgitation signal is inadequate for assessing  PA pressure.   3. The mitral valve is grossly normal. Mild mitral valve regurgitation.  No evidence of mitral stenosis.   4. The aortic valve is grossly normal. There is mild calcification of the  aortic valve. Aortic valve regurgitation is not visualized. No aortic  stenosis is present.   5. The inferior vena cava is normal in size with greater than 50%  respiratory variability, suggesting right atrial pressure of 3 mmHg.   LHC 06/28/2021     Mid Cx lesion is 25% stenosed.   Prox LAD to Mid LAD lesion is 25% stenosed.   Prox RCA to Mid RCA lesion is 99% stenosed.  Left-to-right collaterals.   A drug-eluting stent was successfully placed using a STENT ONYX FRONTIER 3.0X30, postdilated to 3.5 mm.   Post intervention, there is a 0% residual stenosis.   The left ventricular systolic function is normal.   LV end diastolic pressure is normal.   The left ventricular ejection fraction is 55-65% by visual estimate.   There is no aortic valve stenosis.  Past Medical History:  Diagnosis Date   Allergy    Dry eyes    Endometrial polyp    Essential hypertension 08/25/2018   GERD (gastroesophageal reflux disease)    Heart failure (HCC)    History of hiatal hernia    History of kidney stones    Hot flashes, menopausal 10/13/2011   Estradiol started    Jaundice as teenager   no problems since   Memory loss 09/21/2019     1/2 of feet numb all the time   Multiple sclerosis ([place]) dx 2001   Neuropathy    Neuropathy    bilateral feet   Osteoarthritis    Sjogren's syndrome ([place]) dx oct 2021   sore muscvles, dry mouth and eyes   Stroke [facility])    Vertigo    Vision abnormalities     Past Surgical History:  Procedure Laterality Date   ABDOMINAL HYSTERECTOMY  2002   partial   colonscopy  2011   CORONARY STENT INTERVENTION N/A 07/18/2021   Procedure: CORONARY STENT INTERVENTION;  Surgeon: Jettie Booze, MD;  Location: [place] [facility];  Service: Cardiovascular;  Laterality: N/A;   DILATATION & CURETTAGE/HYSTEROSCOPY WITH MYOSURE N/A 06/08/2020   Procedure: DILATATION & CURETTAGE/HYSTEROSCOPY/Polypectomy WITH MYOSURE;  Surgeon: Armandina Stammer, DO;  Location: [place];  Service: Gynecology;  Laterality: N/A;   LEFT HEART CATH AND CORONARY ANGIOGRAPHY N/A 07/18/2021   Procedure: LEFT HEART CATH AND CORONARY ANGIOGRAPHY;  Surgeon: Jettie Booze, MD;  Location: [place] [facility];  Service:  Cardiovascular;  Laterality: N/A;   LUMBAR FUSION  2001   TOTAL HIP ARTHROPLASTY Right 01/17/2021   Procedure: TOTAL HIP ARTHROPLASTY ANTERIOR APPROACH;  Surgeon: Rod Can, MD;  Location: [facility];  Service: Orthopedics;  Laterality: Right;   UPPER GI ENDOSCOPY  yrs ago    Current Medications: Current Meds  Medication Sig   aspirin EC 81 MG tablet Take 81 mg by mouth daily. Swallow whole.   carvedilol (COREG) 6.25 MG tablet Take 1 tablet (6.25 mg total) by mouth 2 (two) times daily with a meal.   cholecalciferol (VITAMIN D) 25 MCG (1000 UNIT) tablet Take 2,000 Units by mouth daily.   clonazePAM (KLONOPIN) 0.5 MG tablet TAKE 1 TABLET(0.5 MG) BY MOUTH AT BEDTIME   clopidogrel (PLAVIX) 75 MG tablet Take  1 tablet (75 mg total) by mouth daily.   clopidogrel (PLAVIX) 75 MG tablet Take 4 tablets (300 mg total) by mouth daily. PLEASE TAKE 316m PLAVIX THE MORNING OF 3/11- AND THEN CONTINUE PLAVIX 740mONCE DAILY THEREAFTER   fexofenadine (ALLEGRA) 180 MG tablet Take 180 mg by mouth at bedtime.    Flaxseed, Linseed, (FLAXSEED OIL PO) Take 5-10 mLs by mouth 3 (three) times a week. Power   hydrALAZINE (APRESOLINE) 10 MG tablet Take 1 tablet (10 mg total) by mouth every 8 (eight) hours.   leflunomide (ARAVA) 20 MG tablet Take 1 tablet (20 mg total) by mouth daily.   Lidocaine 4 % PTCH Place 1 patch onto the skin daily as needed (mild pain). Remove & Discard patch within 12 hours or as directed by MD   losartan (COZAAR) 100 MG tablet TAKE 1 TABLET(100 MG) BY MOUTH DAILY   melatonin 5 MG TABS Take 5 mg by mouth at bedtime as needed.   modafinil (PROVIGIL) 200 MG tablet Take 1 tablet (200 mg total) by mouth daily.   nitroGLYCERIN (NITROSTAT) 0.4 MG SL tablet Place 1 tablet (0.4 mg total) under the tongue every 5 (five) minutes as needed for chest pain.   omeprazole (PRILOSEC) 40 MG capsule Take 1 capsule (40 mg total) by mouth daily.   rosuvastatin (CRESTOR) 10 MG tablet TAKE 1 TABLET BY MOUTH DAILY    solifenacin (VESICARE) 5 MG tablet Take 1 tablet (5 mg total) by mouth daily.   [DISCONTINUED] ticagrelor (BRILINTA) 90 MG TABS tablet Take 1 tablet (90 mg total) by mouth 2 (two) times daily.     Allergies:   Statins   Social History   Socioeconomic History   Marital status: Unknown    Spouse name: Not on file   Number of children: Not on file   Years of education: Not on file   Highest education level: Not on file  Occupational History   Occupation: retired  Tobacco Use   Smoking status: Former    Packs/day: 0.50    Years: 15.00    Pack years: 7.50    Types: Cigarettes    Quit date: 08/05/1983    Years since quitting: 38.1   Smokeless tobacco: Never  Vaping Use   Vaping Use: Never used  Substance and Sexual Activity   Alcohol use: Yes    Alcohol/week: 0.0 standard drinks    Comment: rarely   Drug use: No   Sexual activity: Not Currently    Birth control/protection: Post-menopausal  Other Topics Concern   Not on file  Social History Narrative   Regular Exercise-no   Widowed   Retired   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 1987   Do you live in a house, apartment, assisted  living, condo, trailer, etc.? house   Is it one or more stories? 1 story   How many persons live in your home? 1   Do you have pets in your home?( please list) no   Current or past profession: Science writer    Do you exercise?          yes                        Type and how often? Tai chi and pt  therapy, stretches etc....   Do you have a living will? yes   Do you have a DNR form?      no                             If not, do you want to discuss one? yes   Do you have signed POA/HPOA forms?      no                  If so, please bring to you appointment   Social Determinants of Health   Financial Resource Strain: Not on file  Food Insecurity: Not on file  Transportation Needs: Not on file  Physical Activity: Not on file  Stress: Not on file  Social Connections: Not on file     Family History: The patient's family history includes Arthritis in an other family member; Heart failure in her father and mother; Hypertension in an other family member.  ROS:   Please see the history of present illness.     All other systems reviewed and are negative.  EKGs/Labs/Other Studies Reviewed:    The following studies were reviewed today:   EKG:  EKG is  ordered today.  The ekg ordered today demonstrates    Prior ECGs NSR,  67m TWI laterally  NSR TWI inferiorly  Recent Labs: 06/15/2021: TSH 4.439 06/16/2021: ALT 16; Magnesium 2.3 07/25/2021: BUN 17; Creat 0.67; Potassium 4.5; Sodium 138 08/01/2021: Hemoglobin 13.0; Platelets 261  Recent Lipid Panel    Component Value Date/Time   CHOL 138 09/03/2021 0959   TRIG 174 (H) 09/03/2021 0959   HDL 39 (L) 09/03/2021 0959   CHOLHDL 3.5 09/03/2021 0959   CHOLHDL 4.8 06/15/2021 0256   VLDL 27 06/15/2021 0256   LDLCALC 69 09/03/2021 0959   LDLCALC 129 (H) 04/16/2021 0920   LDLDIRECT 130.2 11/03/2011 0920     Risk Assessment/Calculations:           Physical Exam:    VS:  Vitals:   09/27/21 1029  BP: 128/80  Pulse: 67  SpO2: 97%      Wt Readings from Last 3 Encounters:  09/27/21 169 lb (76.7 kg)  08/02/21 175 lb (79.4 kg)  08/01/21 175 lb 8 oz (79.6 kg)     GEN:  Well nourished, well developed in no acute distress HEENT: Normal NECK: No JVD; No carotid bruits LYMPHATICS: No lymphadenopathy CARDIAC: RRR, 3/6 SEM  RUSB rubs, gallops RESPIRATORY:  Clear to auscultation without rales, wheezing or rhonchi  ABDOMEN: Soft, non-tender, non-distended MUSCULOSKELETAL:  No edema; No deformity  SKIN: Warm and dry NEUROLOGIC:  Alert and oriented x 3 PSYCHIATRIC:  Normal affect   ASSESSMENT:   #RCA PCI:  Inially presented after hospital FU reporting chest pressure and had nstemi. This was in the setting of hypertensive emergency. Was seen in FU and CTA was obtained 2/2 symptoms  and CVD risk including CVA and smoking. She had RCA  FFR+ lesion. She is s/p  prox-mid DES.She denies CP. SOB persists on Brillinta. The cost is prohibitive. Will change to plavix -cont DAPT at least until 07/18/2022 -cont oreg 6.25 mg BID -continue nitro SL -she has her own exercise program that she does for her MS and would like to continue with that instead of cardiac rehab; I said that was fine  #HLD: LDL goal < 70 mg/dL - LDL at goal 69 mg/dL 09/03/2021 - continue crestor 10 mg daily  #HTN: well controlled.  Instructed to take blood pressures at home.Continue coreg 6.25 mg BID, hydralazine 10 mg TID, losartan 100 mg. Goal < 130/80 mmHg   PLAN:    In order of problems listed above:   Change to brillinta to plavix Follow up 6 months          Medication Adjustments/Labs and Tests Ordered: Current medicines are reviewed at length with the patient today.  Concerns regarding medicines are outlined above.  No orders of the defined types were placed in this encounter.  Meds ordered this encounter  Medications   clopidogrel (PLAVIX) 75 MG tablet    Sig: Take 1 tablet (75 mg total) by mouth daily.    Dispense:  30 tablet    Refill:  5   clopidogrel (PLAVIX) 75 MG tablet    Sig: Take 4 tablets (300 mg total) by mouth daily. PLEASE TAKE 350m PLAVIX THE MORNING OF 3/11- AND THEN CONTINUE PLAVIX 771mONCE DAILY THEREAFTER    Dispense:  4 tablet    Refill:  0    Patient Instructions  Medication Instructions:   STOP:  BRILLINTA- TAKE LAST DOSE TONIGHT 3/10  START: PLAVIX (CLOPIDOGREL) 30035mOADING DOSE THE MORNING OF 3/11 AND THEN   START: PLAVIX (CLOPIDOGREL) 23m22mCE DAILY THEREAFTER   *If you need a refill on your cardiac medications before your next appointment, please call your pharmacy*  Follow-Up: At [facility] and your health needs are our priority.  As part of our continuing mission to provide you with exceptional heart care, we have created designated Provider Care Teams.  These Care Teams include your primary Cardiologist (physician) and Advanced Practice Providers (APPs -  Physician Assistants and Nurse Practitioners) who all work together to provide you with the care you need, when you need it.  Your next appointment:   6 month(s)  The format for your next appointment:   In Person  Provider:   BranJanina Mayo      Signed, BranJanina Mayo  09/27/2021 11:13 AM    [facility]

## 2021-09-27 NOTE — Telephone Encounter (Signed)
Returned call to patient and advised her of the need for the loading dose and is aware of all instructions. Patient states she was told by her pharmacy that she can not take her Omeprazole with her plavix daily and patient states that she has a hiatal hernia and she will projectile vomit without taking something for it. Advised I would forward to Dr. Harl Bowie and PharmD for review and advice. Patient verbalized understanding.  ?

## 2021-09-27 NOTE — Patient Instructions (Addendum)
Medication Instructions:  ? ?STOP: BRILLINTA- TAKE LAST DOSE TONIGHT 3/10 ? ?START: PLAVIX (CLOPIDOGREL) '300mg'$  LOADING DOSE THE MORNING OF 3/11 AND THEN  ? ?START: PLAVIX (CLOPIDOGREL) '75mg'$  ONCE DAILY THEREAFTER  ? ?*If you need a refill on your cardiac medications before your next appointment, please call your pharmacy* ? ?Follow-Up: ?At [facility], you and your health needs are our priority.  As part of our continuing mission to provide you with exceptional heart care, we have created designated Provider Care Teams.  These Care Teams include your primary Cardiologist (physician) and Advanced Practice Providers (APPs -  Physician Assistants and Nurse Practitioners) who all work together to provide you with the care you need, when you need it. ? ?Your next appointment:   ?6 month(s) ? ?The format for your next appointment:   ?In Person ? ?Provider:   ?Janina Mayo, MD   ? ?

## 2021-09-27 NOTE — Telephone Encounter (Signed)
I have the pt on the line returning your call with some additional questions ?

## 2021-09-30 ENCOUNTER — Ambulatory Visit (HOSPITAL_BASED_OUTPATIENT_CLINIC_OR_DEPARTMENT_OTHER): Payer: Medicare Other | Admitting: Physical Therapy

## 2021-10-02 ENCOUNTER — Other Ambulatory Visit: Payer: Self-pay | Admitting: Neurology

## 2021-10-02 NOTE — Telephone Encounter (Signed)
Last OV was on 08/01/21.  ?Next OV is scheduled for 02/05/22 .  ?Last RX was written on 04/23/21 for 90 tabs.  ? ?[place] Drug Database has been reviewed.  ?

## 2021-10-06 ENCOUNTER — Other Ambulatory Visit: Payer: Self-pay | Admitting: Neurology

## 2021-10-07 ENCOUNTER — Ambulatory Visit (HOSPITAL_BASED_OUTPATIENT_CLINIC_OR_DEPARTMENT_OTHER): Payer: Medicare Other | Admitting: Physical Therapy

## 2021-10-07 ENCOUNTER — Encounter (HOSPITAL_BASED_OUTPATIENT_CLINIC_OR_DEPARTMENT_OTHER): Payer: Self-pay | Admitting: Physical Therapy

## 2021-10-07 ENCOUNTER — Other Ambulatory Visit: Payer: Self-pay

## 2021-10-07 DIAGNOSIS — R262 Difficulty in walking, not elsewhere classified: Secondary | ICD-10-CM

## 2021-10-07 DIAGNOSIS — M545 Low back pain, unspecified: Secondary | ICD-10-CM

## 2021-10-07 DIAGNOSIS — R252 Cramp and spasm: Secondary | ICD-10-CM

## 2021-10-07 DIAGNOSIS — M6281 Muscle weakness (generalized): Secondary | ICD-10-CM | POA: Diagnosis not present

## 2021-10-07 DIAGNOSIS — M542 Cervicalgia: Secondary | ICD-10-CM | POA: Diagnosis not present

## 2021-10-07 DIAGNOSIS — G8929 Other chronic pain: Secondary | ICD-10-CM | POA: Diagnosis not present

## 2021-10-07 NOTE — Therapy (Addendum)
[place] ?[address] ?[place], [place], [address] ?Phone: [phone]   Fax:  [phone] ? ?Physical Therapy Treatment/[facility] note ? ?Progress Note ?Reporting Period 08/05/2021 to 10/07/2021 ? ?See note below for Objective Data and Assessment of Progress/Goals.  ? ?  ? ?Patient Details  ?Name: Dawn [name] ?MRN: [id number] ?Date of Birth: 04/17/1946 ?Referring Provider (PT): Dr Arlice Colt ? ? ?Encounter Date: 10/07/2021 ? ? PT End of Session - 10/07/21 6270   ? ? Visit Number 23   ? Number of Visits 29   ? Date for PT Re-Evaluation 12/02/21   ? Authorization Type progress note done on visit 23   ? PT Start Time [phone]   Patient was 7 min late  ? PT Stop Time 1015   ? PT Time Calculation (min) 38 min   ? Activity Tolerance Patient tolerated treatment well   ? Behavior During Therapy [facility] for tasks assessed/performed   ? ?  ?  ? ?  ? ? ?Past Medical History:  ?Diagnosis Date  ? Allergy   ? Dry eyes   ? Endometrial polyp   ? Essential hypertension 08/25/2018  ? GERD (gastroesophageal reflux disease)   ? Heart failure ([place])   ? History of hiatal hernia   ? History of kidney stones   ? Hot flashes, menopausal 10/13/2011  ? Estradiol started   ? Jaundice as teenager  ? no problems since  ? Memory loss 09/21/2019  ?   1/2 of feet numb all the time  ? Multiple sclerosis ([place]) dx 2001  ? Neuropathy   ? Neuropathy   ? bilateral feet  ? Osteoarthritis   ? Sjogren's syndrome [facility]) dx oct 2021  ? sore muscvles, dry mouth and eyes  ? Stroke [facility])   ? Vertigo   ? Vision abnormalities   ? ? ?Past Surgical History:  ?Procedure Laterality Date  ? ABDOMINAL HYSTERECTOMY  2002  ? partial  ? colonscopy  2011  ? CORONARY STENT INTERVENTION N/A 07/18/2021  ? Procedure: CORONARY STENT INTERVENTION;  Surgeon: Jettie Booze, MD;  Location: [place] [facility];  Service: Cardiovascular;  Laterality: N/A;  ? DILATATION & CURETTAGE/HYSTEROSCOPY WITH MYOSURE N/A 06/08/2020  ?  Procedure: DILATATION & CURETTAGE/HYSTEROSCOPY/Polypectomy WITH MYOSURE;  Surgeon: Armandina Stammer, DO;  Location: [place];  Service: Gynecology;  Laterality: N/A;  ? LEFT HEART CATH AND CORONARY ANGIOGRAPHY N/A 07/18/2021  ? Procedure: LEFT HEART CATH AND CORONARY ANGIOGRAPHY;  Surgeon: Jettie Booze, MD;  Location: [place] [facility];  Service: Cardiovascular;  Laterality: N/A;  ? LUMBAR FUSION  2001  ? TOTAL HIP ARTHROPLASTY Right 01/17/2021  ? Procedure: TOTAL HIP ARTHROPLASTY ANTERIOR APPROACH;  Surgeon: Rod Can, MD;  [facility]: [facility];  Service: Orthopedics;  Laterality: Right;  ? UPPER GI ENDOSCOPY  yrs ago  ? ? ?There were no vitals filed for this visit. ? ? Subjective Assessment - 10/07/21 0951   ? ? Subjective The patient changed her miedcine. She reports she changed her medicine and now she is no longer having the shortness of breath.   ? Pertinent History MS   ? How long can you stand comfortably? hurts as soon as she stands. Has to shift frequently   ? How long can you walk comfortably? limited community ambualtion   ? Diagnostic tests Nothing recent   ? Patient Stated Goals to go home   ? Currently in Pain? Yes   ? Pain Score 4    ?  Pain Location Back   ? Pain Orientation Right;Left   ? Pain Descriptors / Indicators Aching   ? Pain Type Chronic pain   ? Pain Onset More than a month ago   ? Pain Frequency Intermittent   ? Aggravating Factors  standing and walking   ? Pain Relieving Factors rest   ? Effect of Pain on Daily Activities difficulty perfroming ADL's   ? Multiple Pain Sites No   ? ?  ?  ? ?  ? ? ? ? ? ?  [facility] PT Assessment - 08/05/21 0001   ?  ?    ?     ?  Assessment  ?  Medical Diagnosis Low Back Pain/ Right THA/ Gait distuebance   ?  Referring Provider (PT) Dr Arlice Colt   ?     ?  Strength  ?  Right Hip Flexion --   9.4  3/20 12.2   ?  Right Hip ABduction --   13.8  3/20 16.9  ?  Left Hip ABduction --   24.7 3/20 33.5   ?  Left hip flexion --   17.6 3/20  20.8  ?  Right Knee Extension --   14.8 3/20 24.7  ?  Left Knee Extension --   28.4 3/20 28.0   ?     ?  Palpation  ?  Palpation comment cobtinued tenderness to palpation in the hips and bilateral gluteal.   ?     ?  Ambulation/Gait  ?  Gait Comments 300' baseline HR 65 854 after 300'. Increased right hip pain with 300' basleine B/P 148/80 after walk 162/92  ? ?Will perform 6 min walk test next visit    ?   ? ?5x sit to stand: 20 sec  ? ? ? ? ? ?reviewed tests and measure as and goals going forward.  ? ?LTR x20;  ? ?Supine wand flexion 2x20  ? ?On ball reviewed decompression position. She has been doing this n her couch on her won ? ?Double heel press and brace 3x10  ?Double knee to chest 3x10  ? ?Seated clamshell green 3x10  ? ? ? ? ? ? ? ? ? ? ? ? ? ? ? ? ? ? ? ? ? PT Education - 10/07/21 0957   ? ? Education Details reviewed deompression poistions with the ball   ? Person(s) Educated Patient   ? Methods Explanation;Demonstration;Tactile cues;Verbal cues   ? Comprehension Verbalized understanding;Returned demonstration;Verbal cues required;Tactile cues required   ? ?  ?  ? ?  ? ? ? PT Short Term Goals - 10/07/21 1101   ? ?  ? PT SHORT TERM GOAL #1  ? Title Patient will report a 50% reduction in lower back pain   ? Baseline no low back pain   ? Time 4   ? Period Weeks   ? Status Achieved   ? Target Date 09/02/21   ?  ? PT SHORT TERM GOAL #2  ? Title Patient will ambaulte 300' with no significant spike in B/P or HR   ? Baseline is able to do nu-step for 9 min which is over 600 steps. B/P remains stable   ? Time 4   ? Period Weeks   ? Status Achieved   ? Target Date 09/02/21   ?  ? PT SHORT TERM GOAL #3  ? Title Patient increase right hip flexion and abducion strength by 5 lbs   ? Baseline 3 lbs   ?  Time 4   ? Period Weeks   ? Status On-going   ? Target Date 11/04/21   ?  ? PT SHORT TERM GOAL #4  ? Title Patient will be indepdnent with basic endruance and right hip strengthening program   ? Baseline working on base  program   ? Time 4   ? Period Weeks   ? Status On-going   ? Target Date 11/04/21   ?  ? PT SHORT TERM GOAL #5  ? Title Patient will reduce TUG score by 4   ? Baseline 4+/5   ? Time 4   ? Period Weeks   ? Status New   ? Target Date 11/04/21   ? ?  ?  ? ?  ? ? ? ? PT Long Term Goals - 10/07/21 1104   ? ?  ? PT LONG TERM GOAL #1  ? Title Patient will return to [facility] with a full program for her hip/ lower back/ and endurance   ? Baseline encouraged to try the [facility] center   ? Time 8   ? Period Weeks   ? Status On-going   ? Target Date 12/02/21   ?  ? PT LONG TERM GOAL #2  ? Title Patient will ambualte 600' the 6 min walk test in order to improve community ambualtion   ? Baseline will assess noext visit   ? Time 8   ? Period Weeks   ? Status On-going   ? Target Date 12/02/21   ?  ? PT LONG TERM GOAL #3  ? Title Patient will stand for 30 min without a significant increase in pain in order to perfrom ADL's   ? Baseline progressing but still limited   ? Time 8   ? Period Weeks   ? Status On-going   ? Target Date 12/02/21   ? ?  ?  ? ?  ? ? ? ? ? ? ? ? Plan - 10/07/21 1008   ? ? Clinical Impression Statement The patient is making progress. All of her major muscle strength tests have inproved. We have mover her interval training for enduranxe up to 3 min at a L3. We will perform a 6 min walk next visit. We perfroemd a 5x sit to stand test today as well and we will continue to monitor We reviewed decompresion exercises with the ball today. We will continue to advance as tolerated. She continues to require skilled therapy in orde rto progress intervals and enudrance training wiht monitoring of her HR and B/P for 1 W 8.   ? Personal Factors and Comorbidities Comorbidity 1;Comorbidity 2   ? Comorbidities MS, OA   ? Examination-Activity Limitations Bed Mobility   ? Examination-Participation Restrictions Meal Prep;Cleaning;Community Activity;Laundry;Shop   ? Stability/Clinical Decision Making Stable/Uncomplicated   ?  Clinical Decision Making Low   ? Rehab Potential Good   ? PT Frequency 1x / week   ? PT Duration 8 weeks   ? PT Treatment/Interventions ADLs/Self Care Home Management;Electrical Stimulation;Cryotherapy;Iontophor

## 2021-10-14 ENCOUNTER — Encounter (HOSPITAL_BASED_OUTPATIENT_CLINIC_OR_DEPARTMENT_OTHER): Payer: Self-pay | Admitting: Physical Therapy

## 2021-10-14 ENCOUNTER — Other Ambulatory Visit: Payer: Self-pay

## 2021-10-14 ENCOUNTER — Ambulatory Visit (HOSPITAL_BASED_OUTPATIENT_CLINIC_OR_DEPARTMENT_OTHER): Payer: Medicare Other | Admitting: Physical Therapy

## 2021-10-14 DIAGNOSIS — M545 Low back pain, unspecified: Secondary | ICD-10-CM

## 2021-10-14 DIAGNOSIS — M542 Cervicalgia: Secondary | ICD-10-CM

## 2021-10-14 DIAGNOSIS — R262 Difficulty in walking, not elsewhere classified: Secondary | ICD-10-CM | POA: Diagnosis not present

## 2021-10-14 DIAGNOSIS — M6281 Muscle weakness (generalized): Secondary | ICD-10-CM | POA: Diagnosis not present

## 2021-10-14 DIAGNOSIS — R252 Cramp and spasm: Secondary | ICD-10-CM | POA: Diagnosis not present

## 2021-10-14 DIAGNOSIS — G8929 Other chronic pain: Secondary | ICD-10-CM | POA: Diagnosis not present

## 2021-10-14 NOTE — Therapy (Signed)
[facility] ?[address] ?[address] ?[place], [place], [address] ?Phone: [phone]   Fax:  [phone] ? ?Physical Therapy Treatment ? ?Patient Details  ?Name: Dawn [name] ?MRN: [id number] ?Date of Birth: 11/30/1945 ?Referring Provider (PT): Dr Arlice Colt ? ? ?Encounter Date: 10/14/2021 ? ? PT End of Session - 10/14/21 1032   ? ? Visit Number 24   ? Number of Visits 29   ? Date for PT Re-Evaluation 12/02/21   ? Authorization Type progress note done on visit 23   ? PT Start Time 1015   ? PT Stop Time 1057   ? PT Time Calculation (min) 42 min   ? Activity Tolerance Patient tolerated treatment well   ? Behavior During Therapy [facility] for tasks assessed/performed   ? ?  ?  ? ?  ? ? ?Past Medical History:  ?Diagnosis Date  ? Allergy   ? Dry eyes   ? Endometrial polyp   ? Essential hypertension 08/25/2018  ? GERD (gastroesophageal reflux disease)   ? Heart failure ([place])   ? History of hiatal hernia   ? History of kidney stones   ? Hot flashes, menopausal 10/13/2011  ? Estradiol started   ? Jaundice as teenager  ? no problems since  ? Memory loss 09/21/2019  ?   1/2 of feet numb all the time  ? Multiple sclerosis ([place]) dx 2001  ? Neuropathy   ? Neuropathy   ? bilateral feet  ? Osteoarthritis   ? Sjogren's syndrome [facility]) dx oct 2021  ? sore muscvles, dry mouth and eyes  ? Stroke [facility])   ? Vertigo   ? Vision abnormalities   ? ? ?Past Surgical History:  ?Procedure Laterality Date  ? ABDOMINAL HYSTERECTOMY  2002  ? partial  ? colonscopy  2011  ? CORONARY STENT INTERVENTION N/A 07/18/2021  ? Procedure: CORONARY STENT INTERVENTION;  Surgeon: Jettie Booze, MD;  Location: [place] [facility];  Service: Cardiovascular;  Laterality: N/A;  ? DILATATION & CURETTAGE/HYSTEROSCOPY WITH MYOSURE N/A 06/08/2020  ? Procedure: DILATATION & CURETTAGE/HYSTEROSCOPY/Polypectomy WITH MYOSURE;  Surgeon: Armandina Stammer, DO;  Location: [place];  Service: Gynecology;   Laterality: N/A;  ? LEFT HEART CATH AND CORONARY ANGIOGRAPHY N/A 07/18/2021  ? Procedure: LEFT HEART CATH AND CORONARY ANGIOGRAPHY;  Surgeon: Jettie Booze, MD;  Location: [place] [facility];  Service: Cardiovascular;  Laterality: N/A;  ? LUMBAR FUSION  2001  ? TOTAL HIP ARTHROPLASTY Right 01/17/2021  ? Procedure: TOTAL HIP ARTHROPLASTY ANTERIOR APPROACH;  Surgeon: Rod Can, MD;  [facility];  Service: Orthopedics;  Laterality: Right;  ? UPPER GI ENDOSCOPY  yrs ago  ? ? ?There were no vitals filed for this visit. ? ? Subjective Assessment - 10/14/21 1249   ? ? Subjective The patient was able to take a walk over the weekend. She brought her walker in case she had to sit.   ? Pertinent History MS   ? How long can you stand comfortably? hurts as soon as she stands. Has to shift frequently   ? How long can you walk comfortably? limited community ambualtion   ? Diagnostic tests Nothing recent   ? Patient Stated Goals to go home   ? Currently in Pain? Yes   ? Pain Score 3    ? Pain Location Back   ? Pain Orientation Right;Left   ? Pain Descriptors / Indicators Aching   ? Pain Type Chronic pain   ?  Pain Onset More than a month ago   ? Pain Frequency Intermittent   ? Aggravating Factors  standing and walking   ? Pain Relieving Factors rest   ? Effect of Pain on Daily Activities difficulty performing ADL's   ? Multiple Pain Sites No   ? ?  ?  ? ?  ? ? ? ?Gait: 6 min walk test and explanation  ? ?Manual: roller to right hip and IT band.  ? ?Seated: bilateral ER red 2x10 ?Shoulder flexion red band 2x10  ?Horizontal abduction 2x10  ? ?Supine wand flexion 2lbs 2x10  ? ?Standing row 3x15 green  ? ? ? ? ? ? ? ? ? ? ? ? ? ? ? ? ? ? ? ? ? ? ? ? ? ? ? PT Education - 10/14/21 1140   ? ? Education Details reviewed HEP for UE and importance of posture   ? Person(s) Educated Patient   ? Methods Explanation;Demonstration;Tactile cues;Verbal cues   ? Comprehension Verbalized understanding;Returned demonstration;Verbal  cues required;Tactile cues required   ? ?  ?  ? ?  ? ? ? PT Short Term Goals - 10/07/21 1101   ? ?  ? PT SHORT TERM GOAL #1  ? Title Patient will report a 50% reduction in lower back pain   ? Baseline no low back pain   ? Time 4   ? Period Weeks   ? Status Achieved   ? Target Date 09/02/21   ?  ? PT SHORT TERM GOAL #2  ? Title Patient will ambaulte 300' with no significant spike in B/P or HR   ? Baseline is able to do nu-step for 9 min which is over 600 steps. B/P remains stable   ? Time 4   ? Period Weeks   ? Status Achieved   ? Target Date 09/02/21   ?  ? PT SHORT TERM GOAL #3  ? Title Patient increase right hip flexion and abducion strength by 5 lbs   ? Baseline 3 lbs   ? Time 4   ? Period Weeks   ? Status On-going   ? Target Date 11/04/21   ?  ? PT SHORT TERM GOAL #4  ? Title Patient will be indepdnent with basic endruance and right hip strengthening program   ? Baseline working on base program   ? Time 4   ? Period Weeks   ? Status On-going   ? Target Date 11/04/21   ?  ? PT SHORT TERM GOAL #5  ? Title Patient will reduce TUG score by 4   ? Baseline 4+/5   ? Time 4   ? Period Weeks   ? Status New   ? Target Date 11/04/21   ? ?  ?  ? ?  ? ? ? ? PT Long Term Goals - 10/07/21 1104   ? ?  ? PT LONG TERM GOAL #1  ? Title Patient will return to [facility] with a full program for her hip/ lower back/ and endurance   ? Baseline encouraged to try the [facility] center   ? Time 8   ? Period Weeks   ? Status On-going   ? Target Date 12/02/21   ?  ? PT LONG TERM GOAL #2  ? Title Patient will ambualte 600' the 6 min walk test in order to improve community ambualtion   ? Baseline will assess noext visit   ? Time 8   ? Period Weeks   ?  Status On-going   ? Target Date 12/02/21   ?  ? PT LONG TERM GOAL #3  ? Title Patient will stand for 30 min without a significant increase in pain in order to perfrom ADL's   ? Baseline progressing but still limited   ? Time 8   ? Period Weeks   ? Status On-going   ? Target Date 12/02/21   ? ?  ?   ? ?  ? ? ? ? ? ? ? ? Plan - 10/14/21 1136   ? ? Clinical Impression Statement The patient perfromed a 6 minute walk test today She completed exactlly 900'. She required a seated rest break of 30 sec. She reported hip tightening afterwards. She has able to get her hip feeling better with a thomas stretch and amstring stretch. Therapy also perfromed light manual therapy to her gluteal. She perfromed an UE series today 2nd to endurance testing and interval training. Overall she is making good progress.   ? Personal Factors and Comorbidities Comorbidity 1;Comorbidity 2   ? Comorbidities MS, OA   ? Examination-Activity Limitations Bed Mobility   ? Examination-Participation Restrictions Meal Prep;Cleaning;Community Activity;Laundry;Shop   ? Stability/Clinical Decision Making Stable/Uncomplicated   ? Clinical Decision Making Low   ? Rehab Potential Good   ? PT Frequency 1x / week   ? PT Duration 8 weeks   ? PT Treatment/Interventions ADLs/Self Care Home Management;Electrical Stimulation;Cryotherapy;Iontophoresis '4mg'$ /ml Dexamethasone;Moist Heat;Traction;DME Instruction;Neuromuscular re-education;Patient/family education;Manual techniques;Passive range of motion;Taping;Therapeutic activities;Therapeutic exercise;Balance training;Gait training;Stair training;Functional mobility training;Dry needling;Ultrasound   ? PT Next Visit Plan add in endurance exercises; review tolerance to interval training   ? PT Home Exercise Plan reviewed seated hamstring stretch and LTR. She has done those in the past.   ? Consulted and Agree with Plan of Care Patient   ? ?  ?  ? ?  ? ? ?Patient will benefit from skilled therapeutic intervention in order to improve the following deficits and impairments:  Abnormal gait, Difficulty walking, Decreased range of motion, Decreased mobility, Decreased strength, Postural dysfunction, Pain, Increased muscle spasms, Decreased activity tolerance, Increased fascial restricitons ? ?Visit Diagnosis: ?Chronic  bilateral low back pain without sciatica ? ?Difficulty in walking, not elsewhere classified ? ?Muscle weakness (generalized) ? ?Cervicalgia ? ?Cramp and spasm ? ? ? ? ?Problem List ?Patient Active Problem List  ?

## 2021-10-15 ENCOUNTER — Other Ambulatory Visit: Payer: Medicare Other

## 2021-10-15 DIAGNOSIS — I10 Essential (primary) hypertension: Secondary | ICD-10-CM | POA: Diagnosis not present

## 2021-10-15 DIAGNOSIS — E782 Mixed hyperlipidemia: Secondary | ICD-10-CM

## 2021-10-16 LAB — CBC WITH DIFFERENTIAL/PLATELET
Absolute Monocytes: 368 cells/uL (ref 200–950)
Basophils Absolute: 60 cells/uL (ref 0–200)
Basophils Relative: 1.5 %
Eosinophils Absolute: 180 cells/uL (ref 15–500)
Eosinophils Relative: 4.5 %
HCT: 40.8 % (ref 35.0–45.0)
Hemoglobin: 13.2 g/dL (ref 11.7–15.5)
Lymphs Abs: 568 cells/uL — ABNORMAL LOW (ref 850–3900)
MCH: 29.2 pg (ref 27.0–33.0)
MCHC: 32.4 g/dL (ref 32.0–36.0)
MCV: 90.3 fL (ref 80.0–100.0)
MPV: 11.7 fL (ref 7.5–12.5)
Monocytes Relative: 9.2 %
Neutro Abs: 2824 cells/uL (ref 1500–7800)
Neutrophils Relative %: 70.6 %
Platelets: 234 10*3/uL (ref 140–400)
RBC: 4.52 10*6/uL (ref 3.80–5.10)
RDW: 13.7 % (ref 11.0–15.0)
Total Lymphocyte: 14.2 %
WBC: 4 10*3/uL (ref 3.8–10.8)

## 2021-10-16 LAB — LIPID PANEL
Cholesterol: 144 mg/dL (ref <200)
HDL: 45 mg/dL — ABNORMAL LOW (ref >=50)
LDL Cholesterol (Calc): 76 mg/dL (calc)
Non-HDL Cholesterol (Calc): 99 mg/dL (calc) (ref <130)
Total CHOL/HDL Ratio: 3.2 (calc) (ref <5.0)
Triglycerides: 143 mg/dL (ref <150)

## 2021-10-16 LAB — COMPLETE METABOLIC PANEL WITH GFR
AG Ratio: 2.1 (calc) (ref 1.0–2.5)
ALT: 14 U/L (ref 6–29)
AST: 15 U/L (ref 10–35)
Albumin: 4 g/dL (ref 3.6–5.1)
Alkaline phosphatase (APISO): 127 U/L (ref 37–153)
BUN: 11 mg/dL (ref 7–25)
CO2: 27 mmol/L (ref 20–32)
Calcium: 9.4 mg/dL (ref 8.6–10.4)
Chloride: 105 mmol/L (ref 98–110)
Creat: 0.71 mg/dL (ref 0.60–1.00)
Globulin: 1.9 g/dL (calc) (ref 1.9–3.7)
Glucose, Bld: 90 mg/dL (ref 65–99)
Potassium: 4.4 mmol/L (ref 3.5–5.3)
Sodium: 138 mmol/L (ref 135–146)
Total Bilirubin: 0.3 mg/dL (ref 0.2–1.2)
Total Protein: 5.9 g/dL — ABNORMAL LOW (ref 6.1–8.1)
eGFR: 90 mL/min/{1.73_m2} (ref >=60)

## 2021-10-16 LAB — TSH: TSH: 4.91 mIU/L — ABNORMAL HIGH (ref 0.40–4.50)

## 2021-10-18 ENCOUNTER — Ambulatory Visit (INDEPENDENT_AMBULATORY_CARE_PROVIDER_SITE_OTHER): Payer: Medicare Other | Admitting: Family

## 2021-10-18 ENCOUNTER — Encounter: Payer: Self-pay | Admitting: Family

## 2021-10-18 VITALS — BP 110/80 | HR 63 | Temp 97.2°F | Resp 16 | Ht 62.0 in | Wt 173.0 lb

## 2021-10-18 DIAGNOSIS — E782 Mixed hyperlipidemia: Secondary | ICD-10-CM

## 2021-10-18 DIAGNOSIS — I5032 Chronic diastolic (congestive) heart failure: Secondary | ICD-10-CM

## 2021-10-18 DIAGNOSIS — I10 Essential (primary) hypertension: Secondary | ICD-10-CM | POA: Diagnosis not present

## 2021-10-18 DIAGNOSIS — Z1211 Encounter for screening for malignant neoplasm of colon: Secondary | ICD-10-CM

## 2021-10-18 DIAGNOSIS — Z23 Encounter for immunization: Secondary | ICD-10-CM

## 2021-10-18 DIAGNOSIS — G8929 Other chronic pain: Secondary | ICD-10-CM

## 2021-10-18 DIAGNOSIS — M545 Low back pain, unspecified: Secondary | ICD-10-CM

## 2021-10-18 DIAGNOSIS — I2510 Atherosclerotic heart disease of native coronary artery without angina pectoris: Secondary | ICD-10-CM

## 2021-10-18 DIAGNOSIS — G35 Multiple sclerosis: Secondary | ICD-10-CM | POA: Diagnosis not present

## 2021-10-18 MED ORDER — TETANUS-DIPHTH-ACELL PERTUSSIS 5-2.5-18.5 LF-MCG/0.5 IM SUSP: 0.5000 mL | Freq: Once | INTRAMUSCULAR | 0 refills | Status: AC

## 2021-10-18 NOTE — Progress Notes (Signed)
? ?Provider: Marlowe Sax FNP-C  ? ?Dawn [name], [name], NP ? ?Patient Care Team: ?Reeva Davern, [name], NP as PCP - General (Family Medicine) ?Janina Mayo, MD as PCP - Cardiology (Cardiology) ?[name], MD as Consulting Physician (Orthopedic Surgery) ?[name], [name], MD (Neurology) ?Demarco, [name], [place] (Optometry) ?[name], NP as Nurse Practitioner (Obstetrics and Gynecology) ?[name], MD as Consulting Physician (Dermatology) ? ?Extended Emergency Contact Information ?Primary Emergency Contact: MANUAL,CHARLES(GOD SON) ?Home Phone: [phone] ?Mobile Phone: [phone] ?Relation: Relative ?Secondary Emergency Contact: Walters,Bernie ?Home Phone: [phone] ?Mobile Phone: [phone] ?Relation: Friend ? ?Code Status:  Full Code ?Goals of care: Advanced Directive information ? ?  10/18/2021  ? 10:45 AM  ?Advanced Directives  ?Does Patient Have a Medical Advance Directive? Yes  ?Type of Advance Directive Living will  ?Does patient want to make changes to medical advance directive? No - Patient declined  ? ? ? ?Chief Complaint  ?Patient presents with  ? Medical Management of Chronic Issues  ?  6 month follow up/ Discuss recent labs.  ? Health Maintenance  ?  Discuss the need for Dexa scan and Cologaurd.   ? Immunizations  ?  Discuss the need for Shingrix vaccine, Tetanus vaccine, and Covid Booster.   ? ? ?HPI:  ?Pt is a 76 y.o. female seen today for 25-monthfollow-up for medical management of chronic diseases.  Has a medical history of essential hypertension, chronic diastolic congestive heart failure, CAD, multiple sclerosis, hyperlipidemia, bilateral lower back pain without sciatica among other conditions. ?She denies any acute issues. ?Continues to do exercise. Has support group for multiple sclerosis. ? ?Health maintenance: ?She is due for screening colonoscopy.  She denies any signs of bleeding or dark stool or any abdominal pain.  She would like to do the Cologuard instead of  colonoscopy. ? ?Due for tetanus vaccine, COVID-19 fifth booster vaccine and Shingrix.  Discussed with her to get her COVID-vaccine and Shingrix at her pharmacy will also fax the prescription for tetanus vaccine to her pharmacy. ? ?Past Medical History:  ?Diagnosis Date  ? Allergy   ? Dry eyes   ? Endometrial polyp   ? Essential hypertension 08/25/2018  ? GERD (gastroesophageal reflux disease)   ? Heart failure ([place]   ? History of hiatal hernia   ? History of kidney stones   ? Hot flashes, menopausal 10/13/2011  ? Estradiol started   ? Jaundice as teenager  ? no problems since  ? Memory loss 09/21/2019  ?   1/2 of feet numb all the time  ? [place]  ? Neuropathy   ? Neuropathy   ? bilateral feet  ? Osteoarthritis   ? Sjogren's syndrome ([facility] dx oct 2021  ? sore muscvles, dry mouth and eyes  ? Stroke ([facility]   ? Vertigo   ? Vision abnormalities   ? ?Past Surgical History:  ?Procedure Laterality Date  ? ABDOMINAL HYSTERECTOMY  2002  ? partial  ? colonscopy  2011  ? CORONARY STENT INTERVENTION N/A 07/18/2021  ? Procedure: CORONARY STENT INTERVENTION;  Surgeon: VJettie Booze MD;  [facility];  Service: Cardiovascular;  Laterality: N/A;  ? DILATATION & CURETTAGE/HYSTEROSCOPY WITH MYOSURE N/A 06/08/2020  ? Procedure: DILATATION & CURETTAGE/HYSTEROSCOPY/Polypectomy WITH MYOSURE;  Surgeon: LArmandina Stammer DO;  [facility]  Service: Gynecology;  Laterality: N/A;  ? LEFT HEART CATH AND CORONARY ANGIOGRAPHY N/A 07/18/2021  ? Procedure: LEFT HEART CATH AND CORONARY ANGIOGRAPHY;  Surgeon: VIrish Lack  Charlann Lange, MD;  Location: [place] [facility];  Service: Cardiovascular;  Laterality: N/A;  ? LUMBAR FUSION  2001  ? TOTAL HIP ARTHROPLASTY Right 01/17/2021  ? Procedure: TOTAL HIP ARTHROPLASTY ANTERIOR APPROACH;  Surgeon: Rod Can, MD;  [facility];  Service: Orthopedics;  Laterality: Right;  ? UPPER GI ENDOSCOPY  yrs ago  ? ? ?Allergies  ?Allergen  Reactions  ? Statins Other (See Comments)  ?  Muscle pain  ? ? ?Allergies as of 10/18/2021   ? ?   Reactions  ? Statins Other (See Comments)  ? Muscle pain  ? ?  ? ?  ?Medication List  ?  ? ?  ? Accurate as of October 18, 2021 10:47 AM. If you have any questions, ask your nurse or doctor.  ?  ?  ? ?  ? ?aspirin EC 81 MG tablet ?Take 81 mg by mouth daily. Swallow whole. ?  ?carvedilol 6.25 MG tablet ?Commonly known as: COREG ?Take 1 tablet (6.25 mg total) by mouth 2 (two) times daily with a meal. ?  ?cholecalciferol 25 MCG (1000 UNIT) tablet ?Commonly known as: VITAMIN D ?Take 2,000 Units by mouth daily. ?  ?clonazePAM 0.5 MG tablet ?Commonly known as: KLONOPIN ?TAKE 1 TABLET(0.5 MG) BY MOUTH AT BEDTIME ?  ?clopidogrel 75 MG tablet ?Commonly known as: Plavix ?Take 4 tablets (300 mg total) by mouth daily. PLEASE TAKE '300mg'$  PLAVIX THE MORNING OF 3/11- AND THEN CONTINUE PLAVIX '75mg'$  ONCE DAILY THEREAFTER ?What changed: Another medication with the same name was removed. Continue taking this medication, and follow the directions you see here. ?Changed by: Sandrea Hughs, NP ?  ?fexofenadine 180 MG tablet ?Commonly known as: ALLEGRA ?Take 180 mg by mouth at bedtime. ?  ?FLAXSEED OIL PO ?Take 5-10 mLs by mouth 3 (three) times a week. Power ?  ?hydrALAZINE 10 MG tablet ?Commonly known as: APRESOLINE ?Take 1 tablet (10 mg total) by mouth every 8 (eight) hours. ?  ?lamoTRIgine 25 MG tablet ?Commonly known as: LAMICTAL ?Take 50 mg by mouth 2 (two) times daily. ?What changed: Another medication with the same name was removed. Continue taking this medication, and follow the directions you see here. ?Changed by: Sandrea Hughs, NP ?  ?leflunomide 20 MG tablet ?Commonly known as: ARAVA ?TAKE 1 TABLET(20 MG) BY MOUTH DAILY ?  ?Lidocaine 4 % Ptch ?Place 1 patch onto the skin daily as needed (mild pain). Remove & Discard patch within 12 hours or as directed by MD ?  ?losartan 100 MG tablet ?Commonly known as: COZAAR ?TAKE 1 TABLET(100  MG) BY MOUTH DAILY ?  ?melatonin 5 MG Tabs ?Take 5 mg by mouth at bedtime as needed. ?  ?modafinil 200 MG tablet ?Commonly known as: PROVIGIL ?TAKE 1 TABLET(200 MG) BY MOUTH EVERY MORNING ?  ?nitroGLYCERIN 0.4 MG SL tablet ?Commonly known as: NITROSTAT ?Place 1 tablet (0.4 mg total) under the tongue every 5 (five) minutes as needed for chest pain. ?  ?pantoprazole 40 MG tablet ?Commonly known as: PROTONIX ?Take 1 tablet (40 mg total) by mouth daily. ?  ?rosuvastatin 10 MG tablet ?Commonly known as: CRESTOR ?TAKE 1 TABLET BY MOUTH DAILY ?  ?solifenacin 5 MG tablet ?Commonly known as: VESICARE ?TAKE 1 TABLET(5 MG) BY MOUTH DAILY ?  ? ?  ? ? ?Review of Systems  ?Constitutional:  Negative for appetite change, chills, fatigue, fever and unexpected weight change.  ?HENT:  Negative for congestion, dental problem, ear discharge, ear pain, facial swelling, hearing  loss, nosebleeds, postnasal drip, rhinorrhea, sinus pressure, sinus pain, sneezing, sore throat, tinnitus and trouble swallowing.   ?Eyes:  Negative for pain, discharge, redness, itching and visual disturbance.  ?Respiratory:  Negative for cough, chest tightness, shortness of breath and wheezing.   ?Cardiovascular:  Negative for chest pain, palpitations and leg swelling.  ?Gastrointestinal:  Negative for abdominal distention, abdominal pain, blood in stool, constipation, diarrhea, nausea and vomiting.  ?Endocrine: Negative for cold intolerance, heat intolerance, polydipsia, polyphagia and polyuria.  ?Genitourinary:  Negative for difficulty urinating, dysuria, flank pain, frequency and urgency.  ?Musculoskeletal:  Positive for gait problem. Negative for arthralgias, back pain, joint swelling, myalgias, neck pain and neck stiffness.  ?Skin:  Negative for color change, pallor, rash and wound.  ?Neurological:  Negative for dizziness, syncope, speech difficulty, weakness, light-headedness, numbness and headaches.  ?Hematological:  Does not bruise/bleed easily.   ?Psychiatric/Behavioral:  Negative for agitation, behavioral problems, confusion, hallucinations, self-injury, sleep disturbance and suicidal ideas. The patient is not nervous/anxious.   ? ?Immunization History  ?Admi

## 2021-10-21 ENCOUNTER — Telehealth: Payer: Self-pay

## 2021-10-21 NOTE — Telephone Encounter (Signed)
Received fax from patients pharamacy stating that Plavix '300mg'$ - needed a prior authorization. Patient was started on Plavix on 3/10- and was to complete loading dose of '300mg'$  initially and then continue on Plavix '75mg'$  daily thereafter.  ? ?Attempted to call patient, unable to reach. Left message to call back to office to discuss.  ? ? ?

## 2021-10-21 NOTE — Telephone Encounter (Signed)
Patient called back to say that she got the medication  ?

## 2021-10-21 NOTE — Addendum Note (Signed)
Addended by: Rexanne Mano B on: 10/21/2021 04:18 PM ? ? Modules accepted: Orders ? ?

## 2021-10-21 NOTE — Telephone Encounter (Signed)
Returned call to patient who confirmed that she is only taking Plavix '75mg'$  once daily. Upon chart review it looks as if the Plavix '75mg'$  was removed from patient's chart by mistake.  ? ?Advised patient that I have added this medication back to list of medications and that she should continue. Patient aware and verbalized understanding. ? ?Advised patient to call back to office with any issues, questions, or concerns. Patient verbalized understanding.  ? ?

## 2021-10-23 ENCOUNTER — Encounter (HOSPITAL_BASED_OUTPATIENT_CLINIC_OR_DEPARTMENT_OTHER): Payer: Self-pay | Admitting: Physical Therapy

## 2021-10-23 ENCOUNTER — Ambulatory Visit (HOSPITAL_BASED_OUTPATIENT_CLINIC_OR_DEPARTMENT_OTHER): Payer: Medicare Other | Attending: Neurology | Admitting: Physical Therapy

## 2021-10-23 DIAGNOSIS — R252 Cramp and spasm: Secondary | ICD-10-CM | POA: Insufficient documentation

## 2021-10-23 DIAGNOSIS — M545 Low back pain, unspecified: Secondary | ICD-10-CM | POA: Diagnosis not present

## 2021-10-23 DIAGNOSIS — G8929 Other chronic pain: Secondary | ICD-10-CM | POA: Insufficient documentation

## 2021-10-23 DIAGNOSIS — M6281 Muscle weakness (generalized): Secondary | ICD-10-CM | POA: Diagnosis not present

## 2021-10-23 DIAGNOSIS — R262 Difficulty in walking, not elsewhere classified: Secondary | ICD-10-CM | POA: Insufficient documentation

## 2021-10-23 DIAGNOSIS — M542 Cervicalgia: Secondary | ICD-10-CM | POA: Insufficient documentation

## 2021-10-23 NOTE — Therapy (Signed)
[facility] ?[place], [place], [address] ?Phone: [phone]   Fax:  [phone] ? ?Physical Therapy Treatment ? ?Patient Details  ?Name: Dawn [name] ?MRN: [id number] ?Date of Birth: 06-08-1946 ?Referring Provider (PT): Dr Arlice Colt ? ? ?Encounter Date: 10/23/2021 ? ? ? ? ?Past Medical History:  ?Diagnosis Date  ? Allergy   ? Dry eyes   ? Endometrial polyp   ? Essential hypertension 08/25/2018  ? GERD (gastroesophageal reflux disease)   ? Heart failure ([place])   ? History of hiatal hernia   ? History of kidney stones   ? Hot flashes, menopausal 10/13/2011  ? Estradiol started   ? Jaundice as teenager  ? no problems since  ? Memory loss 09/21/2019  ?   1/2 of feet numb all the time  ? Multiple sclerosis ([place]) dx 2001  ? Neuropathy   ? Neuropathy   ? bilateral feet  ? Osteoarthritis   ? Sjogren's syndrome [facility]) dx oct 2021  ? sore muscvles, dry mouth and eyes  ? Stroke [facility])   ? Vertigo   ? Vision abnormalities   ? ? ?Past Surgical History:  ?Procedure Laterality Date  ? ABDOMINAL HYSTERECTOMY  2002  ? partial  ? colonscopy  2011  ? CORONARY STENT INTERVENTION N/A 07/18/2021  ? Procedure: CORONARY STENT INTERVENTION;  Surgeon: Jettie Booze, MD;  Location: [place] [facility];  Service: Cardiovascular;  Laterality: N/A;  ? DILATATION & CURETTAGE/HYSTEROSCOPY WITH MYOSURE N/A 06/08/2020  ? Procedure: DILATATION & CURETTAGE/HYSTEROSCOPY/Polypectomy WITH MYOSURE;  Surgeon: Armandina Stammer, DO;  Location: [place];  Service: Gynecology;  Laterality: N/A;  ? LEFT HEART CATH AND CORONARY ANGIOGRAPHY N/A 07/18/2021  ? Procedure: LEFT HEART CATH AND CORONARY ANGIOGRAPHY;  Surgeon: Jettie Booze, MD;  Location: [place] [facility];  Service: Cardiovascular;  Laterality: N/A;  ? LUMBAR FUSION  2001  ? TOTAL HIP ARTHROPLASTY Right 01/17/2021  ? Procedure: TOTAL HIP ARTHROPLASTY ANTERIOR APPROACH;  Surgeon: Rod Can, MD;   [facility];  Service: Orthopedics;  Laterality: Right;  ? UPPER GI ENDOSCOPY  yrs ago  ? ? ?There were no vitals filed for this visit. ? ? ? ? ? ? ?  ?L4 2 3 min intervala dn 1 4 Hr baseline 65 after last interval 76  ? ? ? ?Supine march 2x15  ?Supine hip abduction 2x15 [place]  ?Supine bridge 2x15  ? ? ? ?Standing weight shift forward 2x10  ? ?Standing slow march time took to work on technique with particular exercise ? ?Supine wand flexion 3lbs 2x10  ?  ? ? ?  ? ? ? ? ? ? ? ? ? ? ? ? ? ? ? ? ? ? ? ? ? ? ? ? ? ? PT Short Term Goals - 10/07/21 1101   ? ?  ? PT SHORT TERM GOAL #1  ? Title Patient will report a 50% reduction in lower back pain   ? Baseline no low back pain   ? Time 4   ? Period Weeks   ? Status Achieved   ? Target Date 09/02/21   ?  ? PT SHORT TERM GOAL #2  ? Title Patient will ambaulte 300' with no significant spike in B/P or HR   ? Baseline is able to do nu-step for 9 min which is over 600 steps. B/P remains stable   ? Time 4   ? Period Weeks   ? Status Achieved   ?  Target Date 09/02/21   ?  ? PT SHORT TERM GOAL #3  ? Title Patient increase right hip flexion and abducion strength by 5 lbs   ? Baseline 3 lbs   ? Time 4   ? Period Weeks   ? Status On-going   ? Target Date 11/04/21   ?  ? PT SHORT TERM GOAL #4  ? Title Patient will be indepdnent with basic endruance and right hip strengthening program   ? Baseline working on base program   ? Time 4   ? Period Weeks   ? Status On-going   ? Target Date 11/04/21   ?  ? PT SHORT TERM GOAL #5  ? Title Patient will reduce TUG score by 4   ? Baseline 4+/5   ? Time 4   ? Period Weeks   ? Status New   ? Target Date 11/04/21   ? ?  ?  ? ?  ? ? ? ? PT Long Term Goals - 10/07/21 1104   ? ?  ? PT LONG TERM GOAL #1  ? Title Patient will return to [facility] with a full program for her hip/ lower back/ and endurance   ? Baseline encouraged to try the [facility] center   ? Time 8   ? Period Weeks   ? Status On-going   ? Target Date 12/02/21   ?  ? PT LONG TERM GOAL  #2  ? Title Patient will ambualte 600' the 6 min walk test in order to improve community ambualtion   ? Baseline will assess noext visit   ? Time 8   ? Period Weeks   ? Status On-going   ? Target Date 12/02/21   ?  ? PT LONG TERM GOAL #3  ? Title Patient will stand for 30 min without a significant increase in pain in order to perfrom ADL's   ? Baseline progressing but still limited   ? Time 8   ? Period Weeks   ? Status On-going   ? Target Date 12/02/21   ? ?  ?  ? ?  ? ? ? ? ? ? ? ? Plan - 10/23/21 1202   ? ? Clinical Impression Statement Therapy was able to advance her interval resistance. She advanced her intevals to 10 min total. She had no major spike in her HR. Her HR did increase to 76.   ? Personal Factors and Comorbidities Comorbidity 1;Comorbidity 2   ? Comorbidities MS, OA   ? Examination-Activity Limitations Bed Mobility   ? Examination-Participation Restrictions Meal Prep;Cleaning;Community Activity;Laundry;Shop   ? Stability/Clinical Decision Making Stable/Uncomplicated   ? Clinical Decision Making Low   ? Rehab Potential Good   ? PT Frequency 1x / week   ? PT Duration 8 weeks   ? PT Treatment/Interventions ADLs/Self Care Home Management;Electrical Stimulation;Cryotherapy;Iontophoresis '4mg'$ /ml Dexamethasone;Moist Heat;Traction;DME Instruction;Neuromuscular re-education;Patient/family education;Manual techniques;Passive range of motion;Taping;Therapeutic activities;Therapeutic exercise;Balance training;Gait training;Stair training;Functional mobility training;Dry needling;Ultrasound   ? PT Next Visit Plan add in endurance exercises; review tolerance to interval training   ? PT Home Exercise Plan reviewed seated hamstring stretch and LTR. She has done those in the past.   ? Consulted and Agree with Plan of Care Patient   ? ?  ?  ? ?  ? ? ?Patient will benefit from skilled therapeutic intervention in order to improve the following deficits and impairments:  Abnormal gait, Difficulty walking, Decreased  range of motion, Decreased mobility, Decreased strength, Postural dysfunction, Pain, Increased  muscle spasms, Decreased activity tolerance, Increased fascial restricitons ? ?Visit Diagnosis: ?Chronic bilateral low back pain without sciatica ? ?Difficulty in walking, not elsewhere classified ? ?Muscle weakness (generalized) ? ? ? ? ?Problem List ?Patient Active Problem List  ? Diagnosis Date Noted  ? Coronary artery disease   ? Acute diastolic CHF (congestive heart failure) ([place]) 06/15/2021  ? Demand ischemia ([place]) 06/15/2021  ? Chest pain 06/14/2021  ? Elevated troponin 06/14/2021  ? Statin intolerance 04/19/2021  ? Osteoarthritis of right hip 01/17/2021  ? Status post THR (total hip replacement) 01/17/2021  ? Chronic bilateral low back pain without sciatica 10/02/2020  ? Chronic pain syndrome 06/20/2020  ? Post laminectomy syndrome 06/20/2020  ? Sjogren's disease ([place]) 05/23/2020  ? Primary osteoarthritis of both hands 05/23/2020  ? Primary osteoarthritis of both feet 05/23/2020  ? Sacroiliitis, not elsewhere classified ([place]) 12/22/2019  ? Other secondary scoliosis, lumbar region 12/22/2019  ? Spondylosis without myelopathy or radiculopathy, lumbar region 12/22/2019  ? Cervical radiculopathy 10/18/2019  ? Numbness 09/21/2019  ? Bilateral carpal tunnel syndrome 09/21/2019  ? High risk medication use 09/21/2019  ? Memory loss 09/21/2019  ? Essential hypertension 08/25/2018  ? Abnormal SPEP 02/19/2018  ? Hand pain 02/20/2017  ? Multiple joint pain 02/18/2017  ? Right elbow pain 12/09/2016  ? Disturbed cognition 06/25/2016  ? Trochanteric bursitis of left hip 02/18/2016  ? Sciatica, right side 10/30/2015  ? Bilateral arm pain 10/10/2015  ? Trochanteric bursitis of both hips 08/04/2014  ? Chronic fatigue 08/04/2014  ? Urinary frequency 08/04/2014  ? Abnormality of gait 08/04/2014  ? Unspecified visual disturbance 01/31/2013  ? Transient vision disturbance 01/31/2013  ? Routine general medical examination at a health  care facility 11/04/2011  ? Colon polyps 11/03/2011  ? Hot flashes, menopausal 10/13/2011  ? POSTHERPETIC NEURALGIA 10/03/2009  ? DISPLCMT LUMBAR INTERVERT DISC W/O MYELOPATHY 09/05/2009  ? RENAL CALCULUS, REC

## 2021-10-30 ENCOUNTER — Encounter (HOSPITAL_BASED_OUTPATIENT_CLINIC_OR_DEPARTMENT_OTHER): Payer: Self-pay | Admitting: Physical Therapy

## 2021-10-30 ENCOUNTER — Ambulatory Visit (HOSPITAL_BASED_OUTPATIENT_CLINIC_OR_DEPARTMENT_OTHER): Payer: Medicare Other | Admitting: Physical Therapy

## 2021-10-30 DIAGNOSIS — R262 Difficulty in walking, not elsewhere classified: Secondary | ICD-10-CM

## 2021-10-30 DIAGNOSIS — M6281 Muscle weakness (generalized): Secondary | ICD-10-CM | POA: Diagnosis not present

## 2021-10-30 DIAGNOSIS — M545 Low back pain, unspecified: Secondary | ICD-10-CM

## 2021-10-30 DIAGNOSIS — M542 Cervicalgia: Secondary | ICD-10-CM | POA: Diagnosis not present

## 2021-10-30 DIAGNOSIS — G8929 Other chronic pain: Secondary | ICD-10-CM | POA: Diagnosis not present

## 2021-10-30 DIAGNOSIS — R252 Cramp and spasm: Secondary | ICD-10-CM | POA: Diagnosis not present

## 2021-10-30 NOTE — Therapy (Signed)
[place] ?[place] [address] ?[place], [place], [address] ?Phone: [phone]   Fax:  [phone] ? ?Physical Therapy Treatment ? ?Patient Details  ?Name: Dawn [name] ?MRN: [id number] ?Date of Birth: 1945-10-25 ?Referring Provider (PT): Dr Arlice Colt ? ? ?Encounter Date: 10/30/2021 ? ? PT End of Session - 10/30/21 1048   ? ? Visit Number 26   ? Number of Visits 29   ? Date for PT Re-Evaluation 12/02/21   ? Authorization Type progress note done on visit 23   ? PT Start Time 1015   ? PT Stop Time 1058   ? PT Time Calculation (min) 43 min   ? Activity Tolerance Patient tolerated treatment well   ? Behavior During Therapy [facility] for tasks assessed/performed   ? ?  ?  ? ?  ? ? ?Past Medical History:  ?Diagnosis Date  ? Allergy   ? Dry eyes   ? Endometrial polyp   ? Essential hypertension 08/25/2018  ? GERD (gastroesophageal reflux disease)   ? Heart failure ([place])   ? History of hiatal hernia   ? History of kidney stones   ? Hot flashes, menopausal 10/13/2011  ? Estradiol started   ? Jaundice as teenager  ? no problems since  ? Memory loss 09/21/2019  ?   1/2 of feet numb all the time  ? Multiple sclerosis ([place]) dx 2001  ? Neuropathy   ? Neuropathy   ? bilateral feet  ? Osteoarthritis   ? Sjogren's syndrome [facility]) dx oct 2021  ? sore muscvles, dry mouth and eyes  ? Stroke [facility])   ? Vertigo   ? Vision abnormalities   ? ? ?Past Surgical History:  ?Procedure Laterality Date  ? ABDOMINAL HYSTERECTOMY  2002  ? partial  ? colonscopy  2011  ? CORONARY STENT INTERVENTION N/A 07/18/2021  ? Procedure: CORONARY STENT INTERVENTION;  Surgeon: Jettie Booze, MD;  Location: [place] [facility];  Service: Cardiovascular;  Laterality: N/A;  ? DILATATION & CURETTAGE/HYSTEROSCOPY WITH MYOSURE N/A 06/08/2020  ? Procedure: DILATATION & CURETTAGE/HYSTEROSCOPY/Polypectomy WITH MYOSURE;  Surgeon: Armandina Stammer, DO;  Location: [place];  Service: Gynecology;   Laterality: N/A;  ? LEFT HEART CATH AND CORONARY ANGIOGRAPHY N/A 07/18/2021  ? Procedure: LEFT HEART CATH AND CORONARY ANGIOGRAPHY;  Surgeon: Jettie Booze, MD;  Location: [place] [facility];  Service: Cardiovascular;  Laterality: N/A;  ? LUMBAR FUSION  2001  ? TOTAL HIP ARTHROPLASTY Right 01/17/2021  ? Procedure: TOTAL HIP ARTHROPLASTY ANTERIOR APPROACH;  Surgeon: Rod Can, MD;  Location: [facility];  Service: Orthopedics;  Laterality: Right;  ? UPPER GI ENDOSCOPY  yrs ago  ? ? ?There were no vitals filed for this visit. ? ? Subjective Assessment - 10/30/21 1044   ? ? Subjective The patient reports her riht leg has been sore. She has been working on the marching exercise.   ? Pertinent History MS   ? How long can you stand comfortably? hurts as soon as she stands. Has to shift frequently   ? How long can you walk comfortably? limited community ambualtion   ? Diagnostic tests Nothing recent   ? Patient Stated Goals to go home   ? Currently in Pain? Yes   ? Pain Score 4    ? Pain Location Hip   ? Pain Orientation Right   ? Pain Descriptors / Indicators Aching   ? Pain Type Chronic pain   ? Pain Onset More than  a month ago   ? Aggravating Factors  standing and walking   ? Pain Relieving Factors rest   ? Effect of Pain on Daily Activities difficulty perfroming ADL's   ? Multiple Pain Sites No   ? ?  ?  ? ?  ? ? ? ? ? ? ? ?L4 2 3 min intervala dn 1 4 Hr baseline 65 after last interval 76  ?  ?  ?  ?Supine march 2x15  ?Supine hip abduction 2x15 blue ?Supine bridge 2x15  ?  ?  ?  ?Standing weight shift forward x20 ?  ?Standing slow march 2x10 each leg. Less cuing for technique.  ? ?Manual: roller to right hip; trigger point release to right lower back  ? ? ? ? ? ? ? ? ? ? ? ? ? ? ? ? ? ? ? ? ? ? ? PT Short Term Goals - 10/07/21 1101   ? ?  ? PT SHORT TERM GOAL #1  ? Title Patient will report a 50% reduction in lower back pain   ? Baseline no low back pain   ? Time 4   ? Period Weeks   ? Status Achieved   ? Target  Date 09/02/21   ?  ? PT SHORT TERM GOAL #2  ? Title Patient will ambaulte 300' with no significant spike in B/P or HR   ? Baseline is able to do nu-step for 9 min which is over 600 steps. B/P remains stable   ? Time 4   ? Period Weeks   ? Status Achieved   ? Target Date 09/02/21   ?  ? PT SHORT TERM GOAL #3  ? Title Patient increase right hip flexion and abducion strength by 5 lbs   ? Baseline 3 lbs   ? Time 4   ? Period Weeks   ? Status On-going   ? Target Date 11/04/21   ?  ? PT SHORT TERM GOAL #4  ? Title Patient will be indepdnent with basic endruance and right hip strengthening program   ? Baseline working on base program   ? Time 4   ? Period Weeks   ? Status On-going   ? Target Date 11/04/21   ?  ? PT SHORT TERM GOAL #5  ? Title Patient will reduce TUG score by 4   ? Baseline 4+/5   ? Time 4   ? Period Weeks   ? Status New   ? Target Date 11/04/21   ? ?  ?  ? ?  ? ? ? ? PT Long Term Goals - 10/07/21 1104   ? ?  ? PT LONG TERM GOAL #1  ? Title Patient will [facility] to [facility] with a full program for her hip/ lower back/ and endurance   ? Baseline encouraged to try the [facility] center   ? Time 8   ? Period Weeks   ? Status On-going   ? Target Date 12/02/21   ?  ? PT LONG TERM GOAL #2  ? Title Patient will ambualte 600' the 6 min walk test in order to improve community ambualtion   ? Baseline will assess noext visit   ? Time 8   ? Period Weeks   ? Status On-going   ? Target Date 12/02/21   ?  ? PT LONG TERM GOAL #3  ? Title Patient will stand for 30 min without a significant increase in pain in order to perfrom ADL's   ?  Baseline progressing but still limited   ? Time 8   ? Period Weeks   ? Status On-going   ? Target Date 12/02/21   ? ?  ?  ? ?  ? ? ? ? ? ? ? ? Plan - 10/30/21 1049   ? ? Clinical Impression Statement The patient continues to tolerate endurance and interval training well. We were able to get her HR up to 77 bpm from a baseline of 68. It quickly came back down. She had a large trigger point in her  right hip today. She also had some ttp down the IT band. She reported improved pain with manual therapy. She feels like the counter march causes her similar pain> She does feel like her left leg is better. Therapy will continue to peogress as tolerated.   ? Personal Factors and Comorbidities Comorbidity 1;Comorbidity 2   ? Comorbidities MS, OA   ? Examination-Activity Limitations Bed Mobility   ? Examination-Participation Restrictions Meal Prep;Cleaning;Community Activity;Laundry;Shop   ? Stability/Clinical Decision Making Stable/Uncomplicated   ? Clinical Decision Making Low   ? Rehab Potential Good   ? PT Frequency 1x / week   ? PT Duration 8 weeks   ? PT Treatment/Interventions ADLs/Self Care Home Management;Electrical Stimulation;Cryotherapy;Iontophoresis '4mg'$ /ml Dexamethasone;Moist Heat;Traction;DME Instruction;Neuromuscular re-education;Patient/family education;Manual techniques;Passive range of motion;Taping;Therapeutic activities;Therapeutic exercise;Balance training;Gait training;Stair training;Functional mobility training;Dry needling;Ultrasound   ? PT Next Visit Plan add in endurance exercises; review tolerance to interval training   ? PT Home Exercise Plan reviewed seated hamstring stretch and LTR. She has done those in the past.   ? Consulted and Agree with Plan of Care Patient   ? ?  ?  ? ?  ? ? ?Patient will benefit from skilled therapeutic intervention in order to improve the following deficits and impairments:  Abnormal gait, Difficulty walking, Decreased range of motion, Decreased mobility, Decreased strength, Postural dysfunction, Pain, Increased muscle spasms, Decreased activity tolerance, Increased fascial restricitons ? ?Visit Diagnosis: ?Chronic bilateral low back pain without sciatica ? ?Difficulty in walking, not elsewhere classified ? ?Muscle weakness (generalized) ? ? ? ? ?Problem List ?Patient Active Problem List  ? Diagnosis Date Noted  ? Coronary artery disease   ? Acute diastolic CHF  (congestive heart failure) ([place]) 06/15/2021  ? Demand ischemia ([place]) 06/15/2021  ? Chest pain 06/14/2021  ? Elevated troponin 06/14/2021  ? Statin intolerance 04/19/2021  ? Osteoarthritis of right hip 01/18/19

## 2021-11-01 DIAGNOSIS — Z1211 Encounter for screening for malignant neoplasm of colon: Secondary | ICD-10-CM | POA: Diagnosis not present

## 2021-11-04 ENCOUNTER — Telehealth: Payer: Self-pay | Admitting: Internal Medicine

## 2021-11-04 NOTE — Telephone Encounter (Signed)
Pt c/o medication issue: ? ?1. Name of Medication:  ?pantoprazole (PROTONIX) 40 MG tablet ? ?2. How are you currently taking this medication (dosage and times per day)?  ?As prescribed, 1 tablet daily by mouth  ? ?3. Are you having a reaction (difficulty breathing--STAT)?  ?No  ? ?4. What is your medication issue?  ? ?Patient states for the past week this medication has been causing bad acid reflux. ? ?

## 2021-11-04 NOTE — Telephone Encounter (Signed)
Spoke to patient she stated she use to take Omeprazole but had to stop taking due to taking Plavix.Stated Pantoprazole is not helping acid reflux.Advised I will send message to our pharmacist for advice. ?

## 2021-11-04 NOTE — Telephone Encounter (Signed)
Message sent to Dawn [name] for a order for Dexilant or Aciphex. ?

## 2021-11-04 NOTE — Telephone Encounter (Signed)
Dexilant or Aciphex are other options that have limited interaction. ?

## 2021-11-06 ENCOUNTER — Ambulatory Visit (HOSPITAL_BASED_OUTPATIENT_CLINIC_OR_DEPARTMENT_OTHER): Payer: Medicare Other | Admitting: Physical Therapy

## 2021-11-06 NOTE — Telephone Encounter (Signed)
Attempted to call patient, left message for patient to call back to office.   

## 2021-11-07 NOTE — Telephone Encounter (Signed)
Called pt to give the provided recommendations. She will contact her primary care to give the medications a try. She thanked me for calling her back.  ?

## 2021-11-07 NOTE — Telephone Encounter (Signed)
Patient returning call.

## 2021-11-09 LAB — COLOGUARD: COLOGUARD: POSITIVE — AB

## 2021-11-13 ENCOUNTER — Ambulatory Visit (HOSPITAL_BASED_OUTPATIENT_CLINIC_OR_DEPARTMENT_OTHER): Payer: Medicare Other | Admitting: Physical Therapy

## 2021-11-13 ENCOUNTER — Encounter (HOSPITAL_BASED_OUTPATIENT_CLINIC_OR_DEPARTMENT_OTHER): Payer: Self-pay | Admitting: Physical Therapy

## 2021-11-13 DIAGNOSIS — R252 Cramp and spasm: Secondary | ICD-10-CM | POA: Diagnosis not present

## 2021-11-13 DIAGNOSIS — R262 Difficulty in walking, not elsewhere classified: Secondary | ICD-10-CM | POA: Diagnosis not present

## 2021-11-13 DIAGNOSIS — M6281 Muscle weakness (generalized): Secondary | ICD-10-CM | POA: Diagnosis not present

## 2021-11-13 DIAGNOSIS — M545 Low back pain, unspecified: Secondary | ICD-10-CM

## 2021-11-13 DIAGNOSIS — M542 Cervicalgia: Secondary | ICD-10-CM

## 2021-11-13 DIAGNOSIS — G8929 Other chronic pain: Secondary | ICD-10-CM

## 2021-11-13 NOTE — Therapy (Signed)
[place] ?[place] ?[address] ?[place], [place], [address] ?Phone: [phone]   Fax:  [phone] ? ?Physical Therapy Treatment ? ?Patient Details  ?Name: Dawn [name] ?MRN: [id number] ?Date of Birth: 1945-10-17 ?Referring Provider (PT): Dr Arlice Colt ? ? ?Encounter Date: 11/13/2021 ? ? PT End of Session - 11/13/21 1127   ? ? Visit Number 27   ? Number of Visits 29   ? Date for PT Re-Evaluation 12/02/21   ? Authorization Type progress note done on visit 23   ? PT Start Time 1010   ? PT Stop Time 1052   ? PT Time Calculation (min) 42 min   ? Activity Tolerance Patient tolerated treatment well   ? Behavior During Therapy [facility] for tasks assessed/performed   ? ?  ?  ? ?  ? ? ?Past Medical History:  ?Diagnosis Date  ? Allergy   ? Dry eyes   ? Endometrial polyp   ? Essential hypertension 08/25/2018  ? GERD (gastroesophageal reflux disease)   ? Heart failure ([place])   ? History of hiatal hernia   ? History of kidney stones   ? Hot flashes, menopausal 10/13/2011  ? Estradiol started   ? Jaundice as teenager  ? no problems since  ? Memory loss 09/21/2019  ?   1/2 of feet numb all the time  ? Multiple sclerosis ([place]) dx 2001  ? Neuropathy   ? Neuropathy   ? bilateral feet  ? Osteoarthritis   ? Sjogren's syndrome [facility]) dx oct 2021  ? sore muscvles, dry mouth and eyes  ? Stroke [facility])   ? Vertigo   ? Vision abnormalities   ? ? ?Past Surgical History:  ?Procedure Laterality Date  ? ABDOMINAL HYSTERECTOMY  2002  ? partial  ? colonscopy  2011  ? CORONARY STENT INTERVENTION N/A 07/18/2021  ? Procedure: CORONARY STENT INTERVENTION;  Surgeon: Jettie Booze, MD;  Location: [place] [facility];  Service: Cardiovascular;  Laterality: N/A;  ? DILATATION & CURETTAGE/HYSTEROSCOPY WITH MYOSURE N/A 06/08/2020  ? Procedure: DILATATION & CURETTAGE/HYSTEROSCOPY/Polypectomy WITH MYOSURE;  Surgeon: Armandina Stammer, DO;  Location: [place];  Service: Gynecology;   Laterality: N/A;  ? LEFT HEART CATH AND CORONARY ANGIOGRAPHY N/A 07/18/2021  ? Procedure: LEFT HEART CATH AND CORONARY ANGIOGRAPHY;  Surgeon: Jettie Booze, MD;  Location: [place] [facility];  Service: Cardiovascular;  Laterality: N/A;  ? LUMBAR FUSION  2001  ? TOTAL HIP ARTHROPLASTY Right 01/17/2021  ? Procedure: TOTAL HIP ARTHROPLASTY ANTERIOR APPROACH;  Surgeon: Rod Can, MD;  [facility];  Service: Orthopedics;  Laterality: Right;  ? UPPER GI ENDOSCOPY  yrs ago  ? ? ?There were no vitals filed for this visit. ? ? Subjective Assessment - 11/13/21 1108   ? ? Subjective The patient had an acute flair of her hip symptoms last week. It got so bad that she couldn't drive. It is better now, but she is still having pain in her anterior and posterior hip. She has contacted her MD.   ? Pertinent History MS   ? How long can you stand comfortably? hurts as soon as she stands. Has to shift frequently   ? How long can you walk comfortably? limited community ambualtion   ? Diagnostic tests Nothing recent   ? Currently in Pain? Yes   ? Pain Score 5    ? Pain Location Hip   ? Pain Orientation Right   ? Pain Descriptors / Indicators  Aching   ? Pain Type Chronic pain   ? Pain Onset More than a month ago   ? Pain Frequency Intermittent   ? Aggravating Factors  pain just came onthis tie   ? Pain Relieving Factors rest   ? Effect of Pain on Daily Activities had difficulty walking and driving   ? Multiple Pain Sites No   ? ?  ?  ? ?  ? ?Manual: trigger point release to anterior and posterior hip; roller to anterior and posterior hip.  ? ? ? ?  ?  ?Supine march 2x15  ?Supine hip abduction 2x15 blue ?Supine bridge 2x15  ?  ?  ?  ?Standing weight shift forward x20 ? ? ?air-ex :  ? ?Narrow base 3x30 sec hold  ?Tandem stance 2x30 sec hold bilateral  ? ? ? ? ? ? ? ? ? ? ? ? ? ? ? ? ? ? ? ? PT Education - 11/13/21 1132   ? ? Education Details reviewed execises for home. Patient advised to hold on the low march for now.   ?  Person(s) Educated Patient   ? Methods Explanation;Demonstration;Tactile cues;Verbal cues   ? Comprehension Verbalized understanding;Returned demonstration;Verbal cues required;Tactile cues required   ? ?  ?  ? ?  ? ? ? PT Short Term Goals - 10/07/21 1101   ? ?  ? PT SHORT TERM GOAL #1  ? Title Patient will report a 50% reduction in lower back pain   ? Baseline no low back pain   ? Time 4   ? Period Weeks   ? Status Achieved   ? Target Date 09/02/21   ?  ? PT SHORT TERM GOAL #2  ? Title Patient will ambaulte 300' with no significant spike in B/P or HR   ? Baseline is able to do nu-step for 9 min which is over 600 steps. B/P remains stable   ? Time 4   ? Period Weeks   ? Status Achieved   ? Target Date 09/02/21   ?  ? PT SHORT TERM GOAL #3  ? Title Patient increase right hip flexion and abducion strength by 5 lbs   ? Baseline 3 lbs   ? Time 4   ? Period Weeks   ? Status On-going   ? Target Date 11/04/21   ?  ? PT SHORT TERM GOAL #4  ? Title Patient will be indepdnent with basic endruance and right hip strengthening program   ? Baseline working on base program   ? Time 4   ? Period Weeks   ? Status On-going   ? Target Date 11/04/21   ?  ? PT SHORT TERM GOAL #5  ? Title Patient will reduce TUG score by 4   ? Baseline 4+/5   ? Time 4   ? Period Weeks   ? Status New   ? Target Date 11/04/21   ? ?  ?  ? ?  ? ? ? ? PT Long Term Goals - 10/07/21 1104   ? ?  ? PT LONG TERM GOAL #1  ? Title Patient will return to [facility] with a full program for her hip/ lower back/ and endurance   ? Baseline encouraged to try the [facility] center   ? Time 8   ? Period Weeks   ? Status On-going   ? Target Date 12/02/21   ?  ? PT LONG TERM GOAL #2  ? Title Patient will ambualte 600'  the 6 min walk test in order to improve community ambualtion   ? Baseline will assess noext visit   ? Time 8   ? Period Weeks   ? Status On-going   ? Target Date 12/02/21   ?  ? PT LONG TERM GOAL #3  ? Title Patient will stand for 30 min without a significant  increase in pain in order to perfrom ADL's   ? Baseline progressing but still limited   ? Time 8   ? Period Weeks   ? Status On-going   ? Target Date 12/02/21   ? ?  ?  ? ?  ? ? ? ? ? ? ? ? Plan - 11/13/21 1128   ? ? Clinical Impression Statement The patient had a large trigger point in her anterior right qaud. She responeded well to manual therapy to her anterior and posterior quad. She was able to work on light ther-ex without increased pain> We also worked on some instability wrk withotu a significant increase in pain. Therapy will continue to progress as tolerated.   ? Personal Factors and Comorbidities Comorbidity 1;Comorbidity 2   ? Comorbidities MS, OA   ? Examination-Activity Limitations Bed Mobility   ? Examination-Participation Restrictions Meal Prep;Cleaning;Community Activity;Laundry;Shop   ? Stability/Clinical Decision Making Stable/Uncomplicated   ? Clinical Decision Making Low   ? Rehab Potential Good   ? PT Frequency 1x / week   ? PT Duration 8 weeks   ? PT Treatment/Interventions ADLs/Self Care Home Management;Electrical Stimulation;Cryotherapy;Iontophoresis '4mg'$ /ml Dexamethasone;Moist Heat;Traction;DME Instruction;Neuromuscular re-education;Patient/family education;Manual techniques;Passive range of motion;Taping;Therapeutic activities;Therapeutic exercise;Balance training;Gait training;Stair training;Functional mobility training;Dry needling;Ultrasound   ? PT Next Visit Plan add in endurance exercises; review tolerance to interval training   ? PT Home Exercise Plan reviewed seated hamstring stretch and LTR. She has done those in the past.   ? Consulted and Agree with Plan of Care Patient   ? ?  ?  ? ?  ? ? ?Patient will benefit from skilled therapeutic intervention in order to improve the following deficits and impairments:  Abnormal gait, Difficulty walking, Decreased range of motion, Decreased mobility, Decreased strength, Postural dysfunction, Pain, Increased muscle spasms, Decreased activity  tolerance, Increased fascial restricitons ? ?Visit Diagnosis: ?Chronic bilateral low back pain without sciatica ? ?Difficulty in walking, not elsewhere classified ? ?Muscle weakness (generalized) ? ?Cervicalgia ? ?Cr

## 2021-11-14 ENCOUNTER — Other Ambulatory Visit: Payer: Self-pay | Admitting: Family

## 2021-11-14 DIAGNOSIS — R195 Other fecal abnormalities: Secondary | ICD-10-CM | POA: Insufficient documentation

## 2021-11-20 ENCOUNTER — Encounter: Payer: Self-pay | Admitting: Gastroenterology

## 2021-11-22 ENCOUNTER — Encounter (HOSPITAL_BASED_OUTPATIENT_CLINIC_OR_DEPARTMENT_OTHER): Payer: Self-pay | Admitting: Physical Therapy

## 2021-11-22 ENCOUNTER — Ambulatory Visit (HOSPITAL_BASED_OUTPATIENT_CLINIC_OR_DEPARTMENT_OTHER): Payer: Medicare Other | Attending: Neurology | Admitting: Physical Therapy

## 2021-11-22 DIAGNOSIS — G8929 Other chronic pain: Secondary | ICD-10-CM | POA: Insufficient documentation

## 2021-11-22 DIAGNOSIS — T8484XD Pain due to internal orthopedic prosthetic devices, implants and grafts, subsequent encounter: Secondary | ICD-10-CM | POA: Diagnosis not present

## 2021-11-22 DIAGNOSIS — M6281 Muscle weakness (generalized): Secondary | ICD-10-CM | POA: Diagnosis not present

## 2021-11-22 DIAGNOSIS — M542 Cervicalgia: Secondary | ICD-10-CM | POA: Insufficient documentation

## 2021-11-22 DIAGNOSIS — Z471 Aftercare following joint replacement surgery: Secondary | ICD-10-CM | POA: Diagnosis not present

## 2021-11-22 DIAGNOSIS — M545 Low back pain, unspecified: Secondary | ICD-10-CM | POA: Insufficient documentation

## 2021-11-22 DIAGNOSIS — M5459 Other low back pain: Secondary | ICD-10-CM | POA: Insufficient documentation

## 2021-11-22 DIAGNOSIS — R262 Difficulty in walking, not elsewhere classified: Secondary | ICD-10-CM | POA: Diagnosis not present

## 2021-11-22 DIAGNOSIS — M7061 Trochanteric bursitis, right hip: Secondary | ICD-10-CM | POA: Diagnosis not present

## 2021-11-22 DIAGNOSIS — R252 Cramp and spasm: Secondary | ICD-10-CM | POA: Diagnosis not present

## 2021-11-22 DIAGNOSIS — Z96641 Presence of right artificial hip joint: Secondary | ICD-10-CM | POA: Diagnosis not present

## 2021-11-22 NOTE — Therapy (Signed)
[facility] [address] ?[place], [place], [address] ?Phone: [phone]   Fax:  [phone] ? ?Physical Therapy Treatment ? ?Patient Details  ?Name: Dawn [name] ?MRN: [id number] ?Date of Birth: [birth year] ?Referring Provider (PT): Dr Arlice Colt ? ? ?Encounter Date: 11/22/2021 ? ? PT End of Session - 11/22/21 1105   ? ? Visit Number 28   ? Number of Visits 29   ? Date for PT Re-Evaluation 12/02/21   ? Authorization Type progress note done on visit 23   ? PT Start Time 1100   ? PT Stop Time 3818   ? PT Time Calculation (min) 43 min   ? Activity Tolerance Patient tolerated treatment well   ? Behavior During Therapy [facility] for tasks assessed/performed   ? ?  ?  ? ?  ? ? ?Past Medical History:  ?Diagnosis Date  ? Allergy   ? Dry eyes   ? Endometrial polyp   ? Essential hypertension 08/25/2018  ? GERD (gastroesophageal reflux disease)   ? Heart failure ([place])   ? History of hiatal hernia   ? History of kidney stones   ? Hot flashes, menopausal 10/13/2011  ? Estradiol started   ? Jaundice as teenager  ? no problems since  ? Memory loss 09/21/2019  ?   1/2 of feet numb all the time  ? Multiple sclerosis ([place]) dx 2001  ? Neuropathy   ? Neuropathy   ? bilateral feet  ? Osteoarthritis   ? Sjogren's syndrome [facility]) dx oct 2021  ? sore muscvles, dry mouth and eyes  ? Stroke [facility])   ? Vertigo   ? Vision abnormalities   ? ? ?Past Surgical History:  ?Procedure Laterality Date  ? ABDOMINAL HYSTERECTOMY  2002  ? partial  ? colonscopy  2011  ? CORONARY STENT INTERVENTION N/A 07/18/2021  ? Procedure: CORONARY STENT INTERVENTION;  Surgeon: Jettie Booze, MD;  Location: [place] [facility];  Service: Cardiovascular;  Laterality: N/A;  ? DILATATION & CURETTAGE/HYSTEROSCOPY WITH MYOSURE N/A 06/08/2020  ? Procedure: DILATATION & CURETTAGE/HYSTEROSCOPY/Polypectomy WITH MYOSURE;  Surgeon: Armandina Stammer, DO;  Location: [place];  Service: Gynecology;   Laterality: N/A;  ? LEFT HEART CATH AND CORONARY ANGIOGRAPHY N/A 07/18/2021  ? Procedure: LEFT HEART CATH AND CORONARY ANGIOGRAPHY;  Surgeon: Jettie Booze, MD;  Location: [place] [facility];  Service: Cardiovascular;  Laterality: N/A;  ? LUMBAR FUSION  2001  ? TOTAL HIP ARTHROPLASTY Right 01/17/2021  ? Procedure: TOTAL HIP ARTHROPLASTY ANTERIOR APPROACH;  Surgeon: Rod Can, MD;  [facility]: [facility];  Service: Orthopedics;  Laterality: Right;  ? UPPER GI ENDOSCOPY  yrs ago  ? ? ?There were no vitals filed for this visit. ? ? Subjective Assessment - 11/22/21 1249   ? ? Subjective The patien treports her hip as been sore. She has been to the MD today who pressed on some spots that are sore. She reports she has been trying to exercise and move as much as possible.   ? Pertinent History MS   ? How long can you stand comfortably? hurts as soon as she stands. Has to shift frequently   ? How long can you walk comfortably? limited community ambualtion   ? Diagnostic tests Nothing recent   ? Patient Stated Goals to go home   ? Pain Score 6    ? Pain Location Hip   ? Pain Orientation Right   ? Pain Descriptors / Indicators Aching   ?  Pain Type Chronic pain   ? Pain Onset More than a month ago   ? Pain Frequency Intermittent   ? Aggravating Factors  pain with activity   ? Pain Relieving Factors rest   ? Effect of Pain on Daily Activities difficulty walking and driving   ? Multiple Pain Sites No   ? ?  ?  ? ?  ? ? ?Manual: trigger point release to anterior and posterior hip; roller to anterior and posterior hip. PROM into all planes   ?  ?  ?  ?  ?  ?Supine march 2x15  ?Supine hip abduction 2x15 red ? ?  ?SAQ 2x15 each leg  ?LTR x20  ?  ?Seated LAQ 3x10  ?  ?  ? ? ? ? ? ? ? ? ? ? ? ? ? ? ? ? ? ? ? ? ? ? ? ? ? PT Education - 11/22/21 1105   ? ? Education Details reviewed stansding series of exercises   ? Person(s) Educated Patient   ? Methods Explanation;Demonstration;Tactile cues;Verbal cues   ? Comprehension  Verbalized understanding;Returned demonstration;Verbal cues required;Tactile cues required   ? ?  ?  ? ?  ? ? ? PT Short Term Goals - 10/07/21 1101   ? ?  ? PT SHORT TERM GOAL #1  ? Title Patient will report a 50% reduction in lower back pain   ? Baseline no low back pain   ? Time 4   ? Period Weeks   ? Status Achieved   ? Target Date 09/02/21   ?  ? PT SHORT TERM GOAL #2  ? Title Patient will ambaulte 300' with no significant spike in B/P or HR   ? Baseline is able to do nu-step for 9 min which is over 600 steps. B/P remains stable   ? Time 4   ? Period Weeks   ? Status Achieved   ? Target Date 09/02/21   ?  ? PT SHORT TERM GOAL #3  ? Title Patient increase right hip flexion and abducion strength by 5 lbs   ? Baseline 3 lbs   ? Time 4   ? Period Weeks   ? Status On-going   ? Target Date 11/04/21   ?  ? PT SHORT TERM GOAL #4  ? Title Patient will be indepdnent with basic endruance and right hip strengthening program   ? Baseline working on base program   ? Time 4   ? Period Weeks   ? Status On-going   ? Target Date 11/04/21   ?  ? PT SHORT TERM GOAL #5  ? Title Patient will reduce TUG score by 4   ? Baseline 4+/5   ? Time 4   ? Period Weeks   ? Status New   ? Target Date 11/04/21   ? ?  ?  ? ?  ? ? ? ? PT Long Term Goals - 10/07/21 1104   ? ?  ? PT LONG TERM GOAL #1  ? Title Patient will return to [facility] with a full program for her hip/ lower back/ and endurance   ? Baseline encouraged to try the [facility] center   ? Time 8   ? Period Weeks   ? Status On-going   ? Target Date 12/02/21   ?  ? PT LONG TERM GOAL #2  ? Title Patient will ambualte 600' the 6 min walk test in order to improve community ambualtion   ? Baseline  will assess noext visit   ? Time 8   ? Period Weeks   ? Status On-going   ? Target Date 12/02/21   ?  ? PT LONG TERM GOAL #3  ? Title Patient will stand for 30 min without a significant increase in pain in order to perfrom ADL's   ? Baseline progressing but still limited   ? Time 8   ? Period Weeks    ? Status On-going   ? Target Date 12/02/21   ? ?  ?  ? ?  ? ? ? ? ? ? ? ? Plan - 11/22/21 1153   ? ? Clinical Impression Statement The patient tolerated treatment well. She came in with pain but reported feeling much looser after her treatment we reivewed the exercises that the MD reccomended for her. She has no increase in pain> She was advised just to do it on the right. We will review a series of these exercises next visit.   ? Personal Factors and Comorbidities Comorbidity 1;Comorbidity 2   ? ?  ?  ? ?  ? ? ?Patient will benefit from skilled therapeutic intervention in order to improve the following deficits and impairments:    ? ?Visit Diagnosis: ?Chronic bilateral low back pain without sciatica ? ?Difficulty in walking, not elsewhere classified ? ?Muscle weakness (generalized) ? ?Cervicalgia ? ? ? ? ?Problem List ?Patient Active Problem List  ? Diagnosis Date Noted  ? Positive colorectal cancer screening using Cologuard test 11/14/2021  ? Coronary artery disease   ? Acute diastolic CHF (congestive heart failure) ([place]) 06/15/2021  ? Demand ischemia ([place]) 06/15/2021  ? Chest pain 06/14/2021  ? Elevated troponin 06/14/2021  ? Statin intolerance 04/19/2021  ? Osteoarthritis of right hip 01/17/2021  ? Status post THR (total hip replacement) 01/17/2021  ? Chronic bilateral low back pain without sciatica 10/02/2020  ? Chronic pain syndrome 06/20/2020  ? Post laminectomy syndrome 06/20/2020  ? Sjogren's disease ([place]) 05/23/2020  ? Primary osteoarthritis of both hands 05/23/2020  ? Primary osteoarthritis of both feet 05/23/2020  ? Sacroiliitis, not elsewhere classified ([place]) 12/22/2019  ? Other secondary scoliosis, lumbar region 12/22/2019  ? Spondylosis without myelopathy or radiculopathy, lumbar region 12/22/2019  ? Cervical radiculopathy 10/18/2019  ? Numbness 09/21/2019  ? Bilateral carpal tunnel syndrome 09/21/2019  ? High risk medication use 09/21/2019  ? Memory loss 09/21/2019  ? Essential hypertension  08/25/2018  ? Abnormal SPEP 02/19/2018  ? Hand pain 02/20/2017  ? Multiple joint pain 02/18/2017  ? Right elbow pain 12/09/2016  ? Disturbed cognition 06/25/2016  ? Trochanteric bursitis of left hip 02/18/2016  ? Sciatica, r

## 2021-11-27 DIAGNOSIS — T8484XD Pain due to internal orthopedic prosthetic devices, implants and grafts, subsequent encounter: Secondary | ICD-10-CM | POA: Diagnosis not present

## 2021-11-29 ENCOUNTER — Ambulatory Visit (HOSPITAL_BASED_OUTPATIENT_CLINIC_OR_DEPARTMENT_OTHER): Payer: Medicare Other | Admitting: Physical Therapy

## 2021-11-29 ENCOUNTER — Encounter (HOSPITAL_BASED_OUTPATIENT_CLINIC_OR_DEPARTMENT_OTHER): Payer: Self-pay | Admitting: Physical Therapy

## 2021-11-29 DIAGNOSIS — M545 Low back pain, unspecified: Secondary | ICD-10-CM | POA: Diagnosis not present

## 2021-11-29 DIAGNOSIS — R262 Difficulty in walking, not elsewhere classified: Secondary | ICD-10-CM | POA: Diagnosis not present

## 2021-11-29 DIAGNOSIS — R252 Cramp and spasm: Secondary | ICD-10-CM | POA: Diagnosis not present

## 2021-11-29 DIAGNOSIS — M542 Cervicalgia: Secondary | ICD-10-CM

## 2021-11-29 DIAGNOSIS — M6281 Muscle weakness (generalized): Secondary | ICD-10-CM

## 2021-11-29 DIAGNOSIS — G8929 Other chronic pain: Secondary | ICD-10-CM | POA: Diagnosis not present

## 2021-11-29 DIAGNOSIS — M5459 Other low back pain: Secondary | ICD-10-CM

## 2021-11-29 NOTE — Therapy (Signed)
[facility] ?[place] ?[address] ?[place], [place], [address] ?Phone: [phone]   Fax:  [phone] ? ?Physical Therapy Treatment/progress note  ? ?Patient Details  ?Name: Dawn [name] ?MRN: [id number] ?Date of Birth: 06/14/1946 ?Referring Provider (PT): Dr Arlice Colt ? ? ?Encounter Date: 11/29/2021 ? ? PT End of Session - 11/29/21 1106   ? ? Visit Number 29   ? Number of Visits 37   ? Date for PT Re-Evaluation 01/24/22   ? Authorization Type progress note done on visit 29 next to be done on 39   ? PT Start Time 1100   ? PT Stop Time 2505   ? PT Time Calculation (min) 43 min   ? Activity Tolerance Patient tolerated treatment well   ? Behavior During Therapy [facility] for tasks assessed/performed   ? ?  ?  ? ?  ? ? ?Past Medical History:  ?Diagnosis Date  ? Allergy   ? Dry eyes   ? Endometrial polyp   ? Essential hypertension 08/25/2018  ? GERD (gastroesophageal reflux disease)   ? Heart failure ([place])   ? History of hiatal hernia   ? History of kidney stones   ? Hot flashes, menopausal 10/13/2011  ? Estradiol started   ? Jaundice as teenager  ? no problems since  ? Memory loss 09/21/2019  ?   1/2 of feet numb all the time  ? Multiple sclerosis ([place]) dx 2001  ? Neuropathy   ? Neuropathy   ? bilateral feet  ? Osteoarthritis   ? Sjogren's syndrome [facility]) dx oct 2021  ? sore muscvles, dry mouth and eyes  ? Stroke [facility])   ? Vertigo   ? Vision abnormalities   ? ? ?Past Surgical History:  ?Procedure Laterality Date  ? ABDOMINAL HYSTERECTOMY  2002  ? partial  ? colonscopy  2011  ? CORONARY STENT INTERVENTION N/A 07/18/2021  ? Procedure: CORONARY STENT INTERVENTION;  Surgeon: Jettie Booze, MD;  Location: [place] [facility];  Service: Cardiovascular;  Laterality: N/A;  ? DILATATION & CURETTAGE/HYSTEROSCOPY WITH MYOSURE N/A 06/08/2020  ? Procedure: DILATATION & CURETTAGE/HYSTEROSCOPY/Polypectomy WITH MYOSURE;  Surgeon: Armandina Stammer, DO;  Location: [place];  Service: Gynecology;  Laterality: N/A;  ? LEFT HEART CATH AND CORONARY ANGIOGRAPHY N/A 07/18/2021  ? Procedure: LEFT HEART CATH AND CORONARY ANGIOGRAPHY;  Surgeon: Jettie Booze, MD;  Location: [place] [facility];  Service: Cardiovascular;  Laterality: N/A;  ? LUMBAR FUSION  2001  ? TOTAL HIP ARTHROPLASTY Right 01/17/2021  ? Procedure: TOTAL HIP ARTHROPLASTY ANTERIOR APPROACH;  Surgeon: Rod Can, MD;  [facility];  Service: Orthopedics;  Laterality: Right;  ? UPPER GI ENDOSCOPY  yrs ago  ? ? ?There were no vitals filed for this visit. ? ? Subjective Assessment - 11/29/21 1504   ? ? Subjective The patient reports continued pain in her right hip. She has been to her surgeon who would like her to work on her hip in therapy. She has been mostly focusing on cardiovasuclar endurance training since her MI as well as grosss strengthening.   ? How long can you stand comfortably? hurts as soon as she stands. Has to shift frequently   ? How long can you walk comfortably? limited community ambualtion   ? Diagnostic tests Nothing recent   ? Patient Stated Goals to have less pain with ambualtion   ? Currently in Pain? Yes   ? Pain Score 6    ?  Pain Location Hip   ? Pain Orientation Right   ? Pain Descriptors / Indicators Aching   ? Pain Type Chronic pain   ? Pain Onset More than a month ago   ? Pain Frequency Intermittent   ? Aggravating Factors  pain with activity   ? Pain Relieving Factors rest   ? Effect of Pain on Daily Activities difficulty walking and driving   ? Multiple Pain Sites No   ? ?  ?  ? ?  ? ? ? ? ? ? ?Manual: roller and trigger point release to gluteal; trigger point release to anterior hip. [place] PROM into all planes. ? ?Reviewed tests and measures. Reviewed POC going forward.  ? ? ? ? ? ? ? ? ? ? ? ? ? ? ? ? ? ? ? ? ? ? ? ? PT Short Term Goals - 11/29/21 1500   ? ?  ? PT SHORT TERM GOAL #1  ? Title Patient will report a 50% reduction in lower back pain   ? Baseline no low back pain    ? Time 4   ? Period Weeks   ? Status Achieved   ?  ? PT SHORT TERM GOAL #2  ? Title Patient will ambaulte 1200 with no significant spike in B/P or HR   ? Baseline 900 feet goal adjuested   ? Time 4   ? Period Weeks   ? Status Revised   ? Target Date 01/24/22   ?  ? PT SHORT TERM GOAL #3  ? Title Patient increase right hip flexion and abducion strength by 5 lbs   ? Baseline more limited since recent onset of pain   ? Time 4   ? Period Weeks   ? Status On-going   ? Target Date 12/27/21   ?  ? PT SHORT TERM GOAL #4  ? Title Patient will be indepdnent with basic endruance and right hip strengthening program   ? Baseline working on base program   ? Time 4   ? Period Weeks   ? Status On-going   ? Target Date 12/27/21   ?  ? PT SHORT TERM GOAL #5  ? Title Patient will reduce TUG score by 4   ? Baseline not tested today   ? Time 4   ? Period Weeks   ? Status On-going   ? Target Date 12/27/21   ? ?  ?  ? ?  ? ? ? ? PT Long Term Goals - 11/29/21 1501   ? ?  ? PT LONG TERM GOAL #1  ? Title Patient will return to [facility] with a full program for her hip/ lower back/ and endurance   ? Baseline has not been able to 2nd to recent onset of pain   ? Time 8   ? Period Weeks   ? Status On-going   ? Target Date 01/24/22   ?  ? PT LONG TERM GOAL #2  ? Title Patient will ambualte 1200' the 6 min walk test in order to improve community ambualtion   ? Baseline 900 goal revised   ? Time 8   ? Period Weeks   ? Status Revised   ? Target Date 01/24/22   ?  ? PT LONG TERM GOAL #3  ? Title Patient will stand for 30 min without a significant increase in pain in order to perfrom ADL's   ? Baseline still having pain in the right hip   ?  Time 8   ? Period Weeks   ? Status On-going   ? Target Date 01/24/22   ? ?  ?  ? ?  ? ? ? ?Right Hip Flexion --   9.4  3/20 12.2  5/12 10.1  ?  Right Hip ABduction --   13.8  3/20 16.9  5/12  15.6  ?  Left Hip ABduction --   24.7 3/20 33.5 5/12 16.5  ?  Left hip flexion --   17.6 3/20 20.8  5/12  14.1  ?  Right  Knee Extension --   14.8 3/20 24.7 5/12 20.3  ?  Left Knee Extension --   28.4 3/20 28.0 5/12 25.3  ? ? ? ? ? ? Plan - 11/29/21 1453   ? ? Clinical Impression Statement Te patients right hip pain continues to increase. She has been to the MD who has done testing. Her orhtopedic MD has order PT speicifally on the right hip. We have been focuing more on cardiovasuclar endurance and strengthening since her MI. We will switch back to focuisng more on her hip. Her overall strenght has decreased since the last re-assessment. Her total walking distance on the 6 min walk test increased though. Her cardiovascular endurance has improved significanlty. She has been doing intevals with very little change in her heart rate. On todays treatment we focused on manual therapy of her hip. She had increased tenderness to palpation in her anterior hip. She has a large area of facial restriction. She was shown how to self mobilizae this area. We reviewed her tests and measures and POC going forward.   ? Personal Factors and Comorbidities Comorbidity 1;Comorbidity 2   ? Comorbidities MS, OA   ? Examination-Activity Limitations Bed Mobility   ? Examination-Participation Restrictions Meal Prep;Cleaning;Community Activity;Laundry;Shop   ? Stability/Clinical Decision Making Stable/Uncomplicated   ? Clinical Decision Making Low   ? Rehab Potential Good   ? PT Frequency 1x / week   ? PT Duration 8 weeks   ? PT Treatment/Interventions ADLs/Self Care Home Management;Electrical Stimulation;Cryotherapy;Iontophoresis '4mg'$ /ml Dexamethasone;Moist Heat;Traction;DME Instruction;Neuromuscular re-education;Patient/family education;Manual techniques;Passive range of motion;Taping;Therapeutic activities;Therapeutic exercise;Balance training;Gait training;Stair training;Functional mobility training;Dry needling;Ultrasound   ? PT Next Visit Plan add in endurance exercises; review tolerance to interval training   ? PT Home Exercise Plan reviewed seated  hamstring stretch and LTR. She has done those in the past.   ? Consulted and Agree with Plan of Care Patient   ? ?  ?  ? ?  ? ? ?Patient will benefit from skilled therapeutic intervention in order to improve

## 2021-12-06 ENCOUNTER — Encounter (HOSPITAL_BASED_OUTPATIENT_CLINIC_OR_DEPARTMENT_OTHER): Payer: Self-pay | Admitting: Physical Therapy

## 2021-12-06 ENCOUNTER — Ambulatory Visit (HOSPITAL_BASED_OUTPATIENT_CLINIC_OR_DEPARTMENT_OTHER): Payer: Medicare Other | Admitting: Physical Therapy

## 2021-12-06 DIAGNOSIS — M5459 Other low back pain: Secondary | ICD-10-CM

## 2021-12-06 DIAGNOSIS — R252 Cramp and spasm: Secondary | ICD-10-CM | POA: Diagnosis not present

## 2021-12-06 DIAGNOSIS — R262 Difficulty in walking, not elsewhere classified: Secondary | ICD-10-CM

## 2021-12-06 DIAGNOSIS — M545 Low back pain, unspecified: Secondary | ICD-10-CM | POA: Diagnosis not present

## 2021-12-06 DIAGNOSIS — M6281 Muscle weakness (generalized): Secondary | ICD-10-CM | POA: Diagnosis not present

## 2021-12-06 DIAGNOSIS — M542 Cervicalgia: Secondary | ICD-10-CM | POA: Diagnosis not present

## 2021-12-06 DIAGNOSIS — G8929 Other chronic pain: Secondary | ICD-10-CM | POA: Diagnosis not present

## 2021-12-06 NOTE — Therapy (Signed)
[facility] [address] [place], [place], [address] Phone: [phone]   Fax:  [phone]  Physical Therapy Treatment  Patient Details  Name: Dawn [name] MRN: [id number] Date of Birth: [birth year] Referring Provider (PT): Dr Arlice Colt   Encounter Date: 12/06/2021   PT End of Session - 12/06/21 1125     Visit Number 30    Number of Visits 37    Date for PT Re-Evaluation 01/24/22    Authorization Type progress note done on visit 29 next to be done on 38    PT Start Time 1100    PT Stop Time 1143    PT Time Calculation (min) 43 min    Activity Tolerance Patient tolerated treatment well    Behavior During Therapy [facility] for tasks assessed/performed             Past Medical History:  Diagnosis Date   Allergy    Dry eyes    Endometrial polyp    Essential hypertension 08/25/2018   GERD (gastroesophageal reflux disease)    Heart failure ([place])    History of hiatal hernia    History of kidney stones    Hot flashes, menopausal 10/13/2011   Estradiol started    Jaundice as teenager   no problems since   Memory loss 09/21/2019     1/2 of feet numb all the time   Multiple sclerosis ([place]) dx 2001   Neuropathy    Neuropathy    bilateral feet   Osteoarthritis    Sjogren's syndrome ([place]) dx oct 2021   sore muscvles, dry mouth and eyes   Stroke ([place])    Vertigo    Vision abnormalities     Past Surgical History:  Procedure Laterality Date   ABDOMINAL HYSTERECTOMY  2002   partial   colonscopy  2011   CORONARY STENT INTERVENTION N/A 07/18/2021   Procedure: CORONARY STENT INTERVENTION;  Surgeon: Jettie Booze, MD;  Location: [facility];  Service: Cardiovascular;  Laterality: N/A;   DILATATION & CURETTAGE/HYSTEROSCOPY WITH MYOSURE N/A 06/08/2020   Procedure: DILATATION & CURETTAGE/HYSTEROSCOPY/Polypectomy WITH MYOSURE;  Surgeon: Armandina Stammer, DO;  Location: [facility];  Service:  Gynecology;  Laterality: N/A;   LEFT HEART CATH AND CORONARY ANGIOGRAPHY N/A 07/18/2021   Procedure: LEFT HEART CATH AND CORONARY ANGIOGRAPHY;  Surgeon: Jettie Booze, MD;  Location: [facility];  Service: Cardiovascular;  Laterality: N/A;   LUMBAR FUSION  2001   TOTAL HIP ARTHROPLASTY Right 01/17/2021   Procedure: TOTAL HIP ARTHROPLASTY ANTERIOR APPROACH;  Surgeon: Rod Can, MD;  [facility];  Service: Orthopedics;  Laterality: Right;   UPPER GI ENDOSCOPY  yrs ago    There were no vitals filed for this visit.   Subjective Assessment - 12/06/21 1110     Subjective The patient has been gardening. She feels like her right hip is very sore today.    Pertinent History MS    How long can you stand comfortably? hurts as soon as she stands. Has to shift frequently    How long can you walk comfortably? limited community ambualtion    Diagnostic tests Nothing recent    Patient Stated Goals to have less pain with ambualtion    Currently in Pain? Yes    Pain Score 6     Pain Location Hip    Pain Orientation Right    Pain Descriptors / Indicators Aching    Pain Type Chronic pain  Pain Onset More than a month ago    Pain Frequency Intermittent    Aggravating Factors  pain with activity    Pain Relieving Factors rest    Effect of Pain on Daily Activities difficulty walking and driving    Multiple Pain Sites No                Manual: roller and trigger point release to gluteal; trigger point release to anterior hip. Gentle PROM into all planes. LAD to the left LE grade II and III; reviewed how to perform self trigger point release.                             PT Short Term Goals - 11/29/21 1500       PT SHORT TERM GOAL #1   Title Patient will report a 50% reduction in lower back pain    Baseline no low back pain    Time 4    Period Weeks    Status Achieved      PT SHORT TERM GOAL #2   Title Patient will ambaulte 1200 with no  significant spike in B/P or HR    Baseline 900 feet goal adjuested    Time 4    Period Weeks    Status Revised    Target Date 01/24/22      PT SHORT TERM GOAL #3   Title Patient increase right hip flexion and abducion strength by 5 lbs    Baseline more limited since recent onset of pain    Time 4    Period Weeks    Status On-going    Target Date 12/27/21      PT SHORT TERM GOAL #4   Title Patient will be indepdnent with basic endruance and right hip strengthening program    Baseline working on base program    Time 4    Period Weeks    Status On-going    Target Date 12/27/21      PT SHORT TERM GOAL #5   Title Patient will reduce TUG score by 4    Baseline not tested today    Time 4    Period Weeks    Status On-going    Target Date 12/27/21               PT Long Term Goals - 11/29/21 1501       PT LONG TERM GOAL #1   Title Patient will return to [facility] with a full program for her hip/ lower back/ and endurance    Baseline has not been able to 2nd to recent onset of pain    Time 8    Period Weeks    Status On-going    Target Date 01/24/22      PT LONG TERM GOAL #2   Title Patient will ambualte 1200' the 6 min walk test in order to improve community ambualtion    Baseline 900 goal revised    Time 8    Period Weeks    Status Revised    Target Date 01/24/22      PT LONG TERM GOAL #3   Title Patient will stand for 30 min without a significant increase in pain in order to perfrom ADL's    Baseline still having pain in the right hip    Time 8    Period Weeks    Status On-going    Target Date  01/24/22                   Plan - 12/06/21 1255     Clinical Impression Statement The patiewnt has significant trigger points in her anterior hip and her gluteal. We will consider needling next visit if they do not improve. We also worked on LAD of the left side to decrease pain. Overall she reported a signifcant reduction in pain> Therapy will continue  to progress patient as tolerated. Next visit we will hopefully be able to get back into pmore progressive strengthening..    Personal Factors and Comorbidities Comorbidity 1;Comorbidity 2    Comorbidities MS, OA    Examination-Activity Limitations Bed Mobility    Examination-Participation Restrictions Meal Prep;Cleaning;Community Activity;Laundry;Shop    Stability/Clinical Decision Making Stable/Uncomplicated    Clinical Decision Making Low    Rehab Potential Good    PT Frequency 1x / week    PT Duration 8 weeks    PT Treatment/Interventions ADLs/Self Care Home Management;Electrical Stimulation;Cryotherapy;Iontophoresis '4mg'$ /ml Dexamethasone;Moist Heat;Traction;DME Instruction;Neuromuscular re-education;Patient/family education;Manual techniques;Passive range of motion;Taping;Therapeutic activities;Therapeutic exercise;Balance training;Gait training;Stair training;Functional mobility training;Dry needling;Ultrasound    PT Next Visit Plan add in endurance exercises; review tolerance to interval training    PT Home Exercise Plan reviewed seated hamstring stretch and LTR. She has done those in the past.    Consulted and Agree with Plan of Care Patient             Patient will benefit from skilled therapeutic intervention in order to improve the following deficits and impairments:  Abnormal gait, Difficulty walking, Decreased range of motion, Decreased mobility, Decreased strength, Postural dysfunction, Pain, Increased muscle spasms, Decreased activity tolerance, Increased fascial restricitons  Visit Diagnosis: Difficulty in walking, not elsewhere classified  Cervicalgia  Cramp and spasm  Other low back pain  Muscle weakness (generalized)     Problem List Patient Active Problem List   Diagnosis Date Noted   Positive colorectal cancer screening using Cologuard test 11/14/2021   Coronary artery disease    Acute diastolic CHF (congestive heart failure) ([place]) 06/15/2021   Demand  ischemia ([place]) 06/15/2021   Chest pain 06/14/2021   Elevated troponin 06/14/2021   Statin intolerance 04/19/2021   Osteoarthritis of right hip 01/17/2021   Status post THR (total hip replacement) 01/17/2021   Chronic bilateral low back pain without sciatica 10/02/2020   Chronic pain syndrome 06/20/2020   Post laminectomy syndrome 06/20/2020   Sjogren's disease ([place]) 05/23/2020   Primary osteoarthritis of both hands 05/23/2020   Primary osteoarthritis of both feet 05/23/2020   Sacroiliitis, not elsewhere classified ([place]) 12/22/2019   Other secondary scoliosis, lumbar region 12/22/2019   Spondylosis without myelopathy or radiculopathy, lumbar region 12/22/2019   Cervical radiculopathy 10/18/2019   Numbness 09/21/2019   Bilateral carpal tunnel syndrome 09/21/2019   High risk medication use 09/21/2019   Memory loss 09/21/2019   Essential hypertension 08/25/2018   Abnormal SPEP 02/19/2018   Hand pain 02/20/2017   Multiple joint pain 02/18/2017   Right elbow pain 12/09/2016   Disturbed cognition 06/25/2016   Trochanteric bursitis of left hip 02/18/2016   Sciatica, right side 10/30/2015   Bilateral arm pain 10/10/2015   Trochanteric bursitis of both hips 08/04/2014   Chronic fatigue 08/04/2014   Urinary frequency 08/04/2014   Abnormality of gait 08/04/2014   Unspecified visual disturbance 01/31/2013   Transient vision disturbance 01/31/2013   Routine general medical examination at a health care facility 11/04/2011   Colon polyps 11/03/2011   Hot  flashes, menopausal 10/13/2011   POSTHERPETIC NEURALGIA 10/03/2009   DISPLCMT LUMBAR INTERVERT DISC W/O MYELOPATHY 09/05/2009   RENAL CALCULUS, RECURRENT 09/04/2009   Hyperlipidemia 08/31/2009   Multiple sclerosis ([place]) 08/31/2009   ALLERGIC RHINITIS 08/31/2009    Carney Living, PT 12/06/2021, 12:59 PM  [facility] [address] [place], [place], [address] Phone: [phone]    Fax:  [phone]  Name: Dawn [name] MRN: [id number] Date of Birth: July 29, 1945

## 2021-12-09 IMAGING — CR DG LUMBAR SPINE COMPLETE 4+V
5 series · 5 of 5 positions shown · non-contrast
Comparison: None.

CLINICAL DATA: Low back pain

EXAM:
LUMBAR SPINE - COMPLETE 4+ VIEW

[t l-spine a.p.]
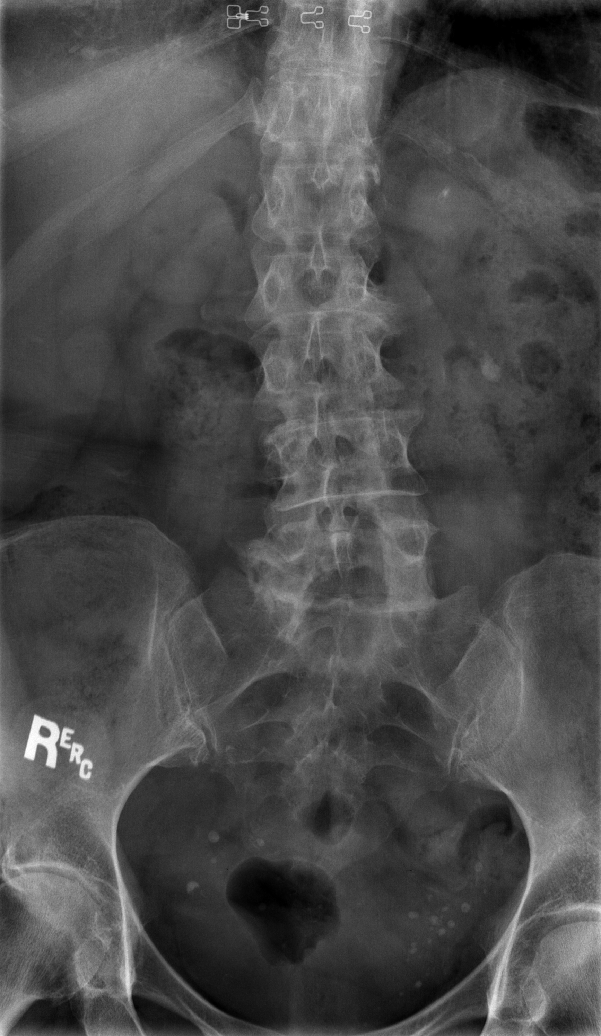

[t l-spine oblique exposure (1 of 2)]
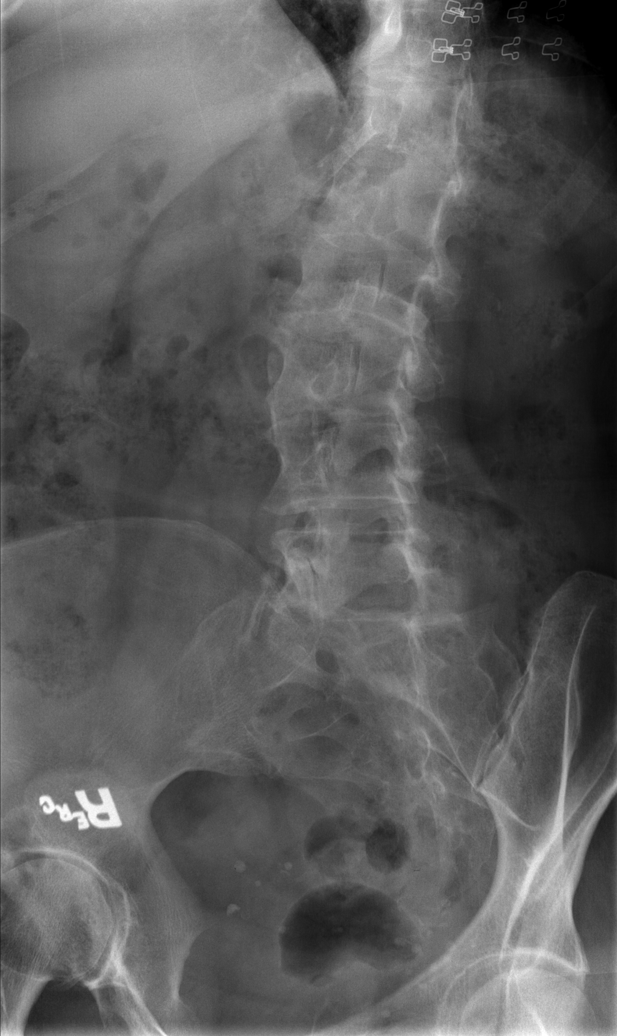

[t l-spine oblique exposure (2 of 2)]
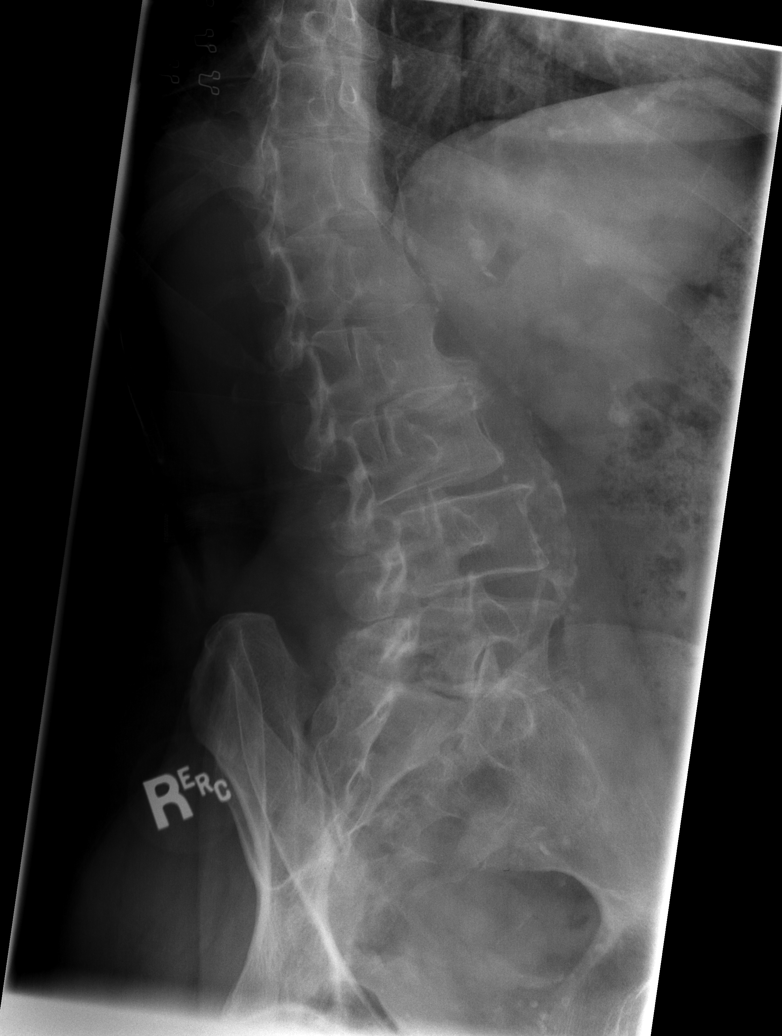

[t l-spine lat]
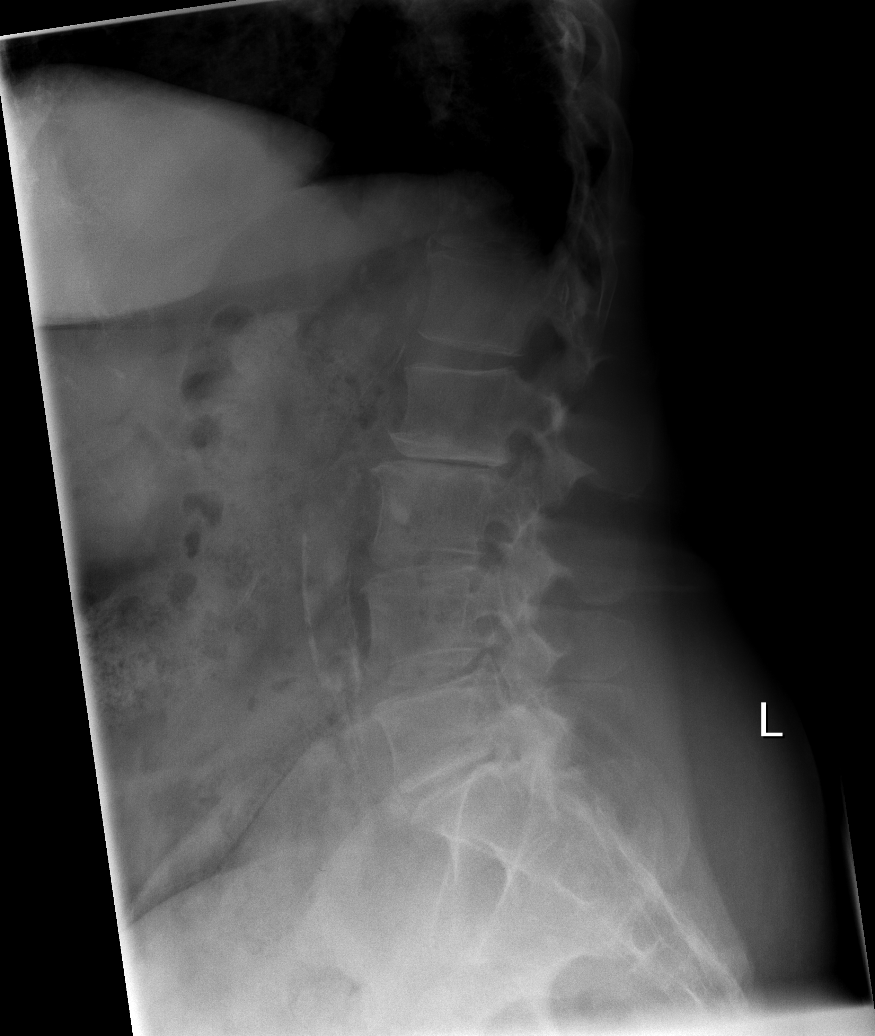

[t l-spine l5-s1 spot]
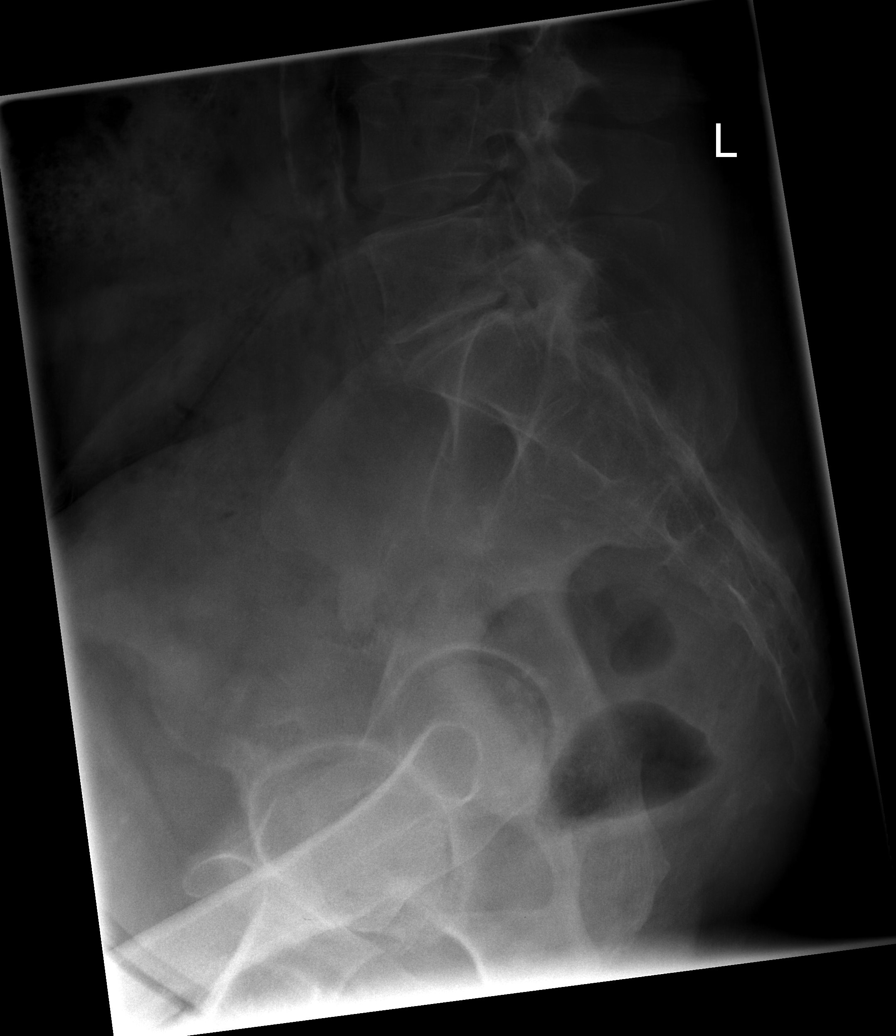

[5 of 5 positions shown; findings below may reference images not displayed]

FINDINGS: There is no evidence of lumbar spine fracture. No static listhesis.
Mild S-shaped curvature of the thoracolumbar spine. Degenerative
disease with disc height loss at L2-3, L3-4, L4-5 and L5-S1 with
bilateral facet arthropathy most severe at L5-S1.

Left renal calculus.

Abdominal aortic atherosclerosis.

Moderate osteoarthritis of the right hip.
IMPRESSION: 1. Lumbar spine spondylosis as described above.
2. No acute osseous injury of the lumbar spine.

## 2021-12-11 ENCOUNTER — Other Ambulatory Visit: Payer: Self-pay | Admitting: Internal Medicine

## 2021-12-13 ENCOUNTER — Ambulatory Visit (INDEPENDENT_AMBULATORY_CARE_PROVIDER_SITE_OTHER): Payer: Medicare Other | Admitting: Gastroenterology

## 2021-12-13 ENCOUNTER — Encounter: Payer: Self-pay | Admitting: Gastroenterology

## 2021-12-13 VITALS — BP 130/84 | HR 63 | Ht 62.0 in | Wt 168.0 lb

## 2021-12-13 DIAGNOSIS — R195 Other fecal abnormalities: Secondary | ICD-10-CM | POA: Diagnosis not present

## 2021-12-13 DIAGNOSIS — Z7902 Long term (current) use of antithrombotics/antiplatelets: Secondary | ICD-10-CM | POA: Diagnosis not present

## 2021-12-13 MED ORDER — NA SULFATE-K SULFATE-MG SULF 17.5-3.13-1.6 GM/177ML PO SOLN: 1.0000 | Freq: Once | ORAL | 0 refills | Status: AC

## 2021-12-13 NOTE — Progress Notes (Signed)
Referring Provider: Sandrea Hughs, NP Primary Care Physician:  Sandrea Hughs, NP   Reason for Consultation: Positive Cologuard   IMPRESSION:  Cologuard positive  History of colon polyp on last colonoscopy in 2008 Chronic Plavix prescribed by Dr. Harl Bowie (Cardiology) Chronic constipation attributed to MS - managed with flax seed and diet  Colonoscopy recommended.  Ideally will hold Plavix for 5 days before endoscopy however, she has recently had.  I discussed with the patient that there is a low, but real, risk of a cardiovascular event such as heart attack, stroke, or embolism/thrombosis while off Plavix. Will communicate byEMR with patient's prescribing provider to confirm that holding the Plavix is appropriate at this time.   PLAN: - Colonoscopy with a 2 day bowel prep after a Plavix washout - Obtain prior colonoscopy and path results from [facility]   HPI: Dawn [name] is a 77 y.o. female referred for positive Cologuard.  The history is obtained through the patient review of her electronic health record.  She has hypertension, reflux, heart failure, coronary artery disease with history of NSTEM 11/22, Sjogren's syndrome, multiple sclerosis, hyperlipidemia, bilateral lower back pain, CVA on aspirin.  She is on Plavix. EF on echo 05/2021 showed EF 60-65%. RCA PCI 12/22/ She is a retired Pharmacist, hospital.   No symptoms associated with the + cologuard.  No overt GI blood loss. No melena, hematochezia, bright red blood per rectum. No epistaxis, vaginal bleeding, hemoptysis, or hematuria.    She has chronic constipation that she attributes to her MS that she controls with ground flax seed and a high fiber diet. She previously used Miralax prior to starting flax seed.   She has reflux that she treats with famotidine. She was previously on PPI therapy but this was switched to famotidine when she started Plavix.   Records make reference an adenomatous polyp on colonoscopy in 2008.  However,  I am unable to find the procedure note or the path results.  There is no known family history of colon cancer or polyps. No family history of stomach cancer or other GI malignancy. No family history of inflammatory bowel disease or celiac.    Past Medical History:  Diagnosis Date   Allergy    Dry eyes    Endometrial polyp    Essential hypertension 08/25/2018   GERD (gastroesophageal reflux disease)    Heart failure ([facility])    History of hiatal hernia    History of kidney stones    Hot flashes, menopausal 10/13/2011   Estradiol started    Jaundice as teenager   no problems since   Memory loss 09/21/2019     1/2 of feet numb all the time   Multiple sclerosis ([place]) dx 2001   Neuropathy    Neuropathy    bilateral feet   Osteoarthritis    Sjogren's syndrome ([place]) dx oct 2021   sore muscvles, dry mouth and eyes   Stroke [facility])    Vertigo    Vision abnormalities     Past Surgical History:  Procedure Laterality Date   ABDOMINAL HYSTERECTOMY  2002   partial   colonscopy  2011   CORONARY STENT INTERVENTION N/A 07/18/2021   Procedure: CORONARY STENT INTERVENTION;  Surgeon: Jettie Booze, MD;  Location: [facility];  Service: Cardiovascular;  Laterality: N/A;   DILATATION & CURETTAGE/HYSTEROSCOPY WITH MYOSURE N/A 06/08/2020   Procedure: DILATATION & CURETTAGE/HYSTEROSCOPY/Polypectomy WITH MYOSURE;  Surgeon: Armandina Stammer, DO;  Location: [facility];  Service: Gynecology;  Laterality: N/A;   LEFT HEART CATH AND CORONARY ANGIOGRAPHY N/A 07/18/2021   Procedure: LEFT HEART CATH AND CORONARY ANGIOGRAPHY;  Surgeon: Jettie Booze, MD;  Location: [facility];  Service: Cardiovascular;  Laterality: N/A;   LUMBAR FUSION  2001   TOTAL HIP ARTHROPLASTY Right 01/17/2021   Procedure: TOTAL HIP ARTHROPLASTY ANTERIOR APPROACH;  Surgeon: Rod Can, MD;  [facility];  Service: Orthopedics;  Laterality: Right;   UPPER GI ENDOSCOPY  yrs ago     Current Outpatient Medications  Medication Sig Dispense Refill   aspirin EC 81 MG tablet Take 81 mg by mouth daily. Swallow whole.     carvedilol (COREG) 6.25 MG tablet Take 1 tablet (6.25 mg total) by mouth 2 (two) times daily with a meal. 180 tablet 3   cholecalciferol (VITAMIN D) 25 MCG (1000 UNIT) tablet Take 2,000 Units by mouth daily.     clonazePAM (KLONOPIN) 0.5 MG tablet TAKE 1 TABLET(0.5 MG) BY MOUTH AT BEDTIME 90 tablet 1   clopidogrel (PLAVIX) 75 MG tablet Take 75 mg by mouth daily.     fexofenadine (ALLEGRA) 180 MG tablet Take 180 mg by mouth at bedtime.      Flaxseed, Linseed, (FLAXSEED OIL PO) Take 5-10 mLs by mouth 3 (three) times a week. Power     hydrALAZINE (APRESOLINE) 10 MG tablet TAKE 1 TABLET BY MOUTH EVERY 8 HOURS. 120 tablet 3   lamoTRIgine (LAMICTAL) 25 MG tablet Take 50 mg by mouth 2 (two) times daily.     leflunomide (ARAVA) 20 MG tablet TAKE 1 TABLET(20 MG) BY MOUTH DAILY 90 tablet 3   Lidocaine 4 % PTCH Place 1 patch onto the skin daily as needed (mild pain). Remove & Discard patch within 12 hours or as directed by MD     losartan (COZAAR) 100 MG tablet TAKE 1 TABLET(100 MG) BY MOUTH DAILY 90 tablet 1   melatonin 5 MG TABS Take 5 mg by mouth at bedtime as needed.     modafinil (PROVIGIL) 200 MG tablet TAKE 1 TABLET(200 MG) BY MOUTH EVERY MORNING 90 tablet 0   Na Sulfate-K Sulfate-Mg Sulf 17.5-3.13-1.6 GM/177ML SOLN Take 1 kit by mouth once for 1 dose. 354 mL 0   nitroGLYCERIN (NITROSTAT) 0.4 MG SL tablet Place 1 tablet (0.4 mg total) under the tongue every 5 (five) minutes as needed for chest pain. 30 tablet 0   pantoprazole (PROTONIX) 40 MG tablet Take 1 tablet (40 mg total) by mouth daily. 30 tablet 11   rosuvastatin (CRESTOR) 10 MG tablet TAKE 1 TABLET BY MOUTH DAILY 90 tablet 3   solifenacin (VESICARE) 5 MG tablet TAKE 1 TABLET(5 MG) BY MOUTH DAILY 90 tablet 3   No current facility-administered medications for this visit.    Allergies as of 12/13/2021  - Review Complete 12/13/2021  Allergen Reaction Noted   Statins Other (See Comments) 05/14/2021    Family History  Problem Relation Age of Onset   Heart failure Mother    Heart failure Father    Arthritis Other    Hypertension Other     Social History   Socioeconomic History   Marital status: Unknown    Spouse name: Not on file   Number of children: Not on file   Years of education: Not on file   Highest education level: Not on file  Occupational History   Occupation: retired  Tobacco Use   Smoking status: Former    Packs/day: 0.50  Years: 15.00    Pack years: 7.50    Types: Cigarettes    Quit date: 08/05/1983    Years since quitting: 38.3   Smokeless tobacco: Never  Vaping Use   Vaping Use: Never used  Substance and Sexual Activity   Alcohol use: Yes    Alcohol/week: 0.0 standard drinks    Comment: rarely   Drug use: No   Sexual activity: Not Currently    Birth control/protection: Post-menopausal  Other Topics Concern   Not on file  Social History Narrative   Regular Exercise-no   Widowed   Retired   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     Tobacco use, amount per day now: former   Past tobacco use, amount per day: 1/2-1 packet   How many years did you use tobacco: 12 years   Alcohol use (drinks per week): 0   Diet: mediterranean based   Do you drink/eat things with caffeine: yes   Marital status:        widow                          What year were you married? 1987   Do you live in a house, apartment, assisted living, condo, trailer, etc.? house   Is it one or more stories? 1 story   How many persons live in your home? 1   Do you have pets in your home?( please list) no   Current or past profession: Science writer    Do you exercise?          yes                        Type and how  often? Tai chi and pt therapy, stretches etc....   Do you have a living will? yes   Do you have a DNR form?      no                             If not, do you want to discuss one? yes   Do you have signed POA/HPOA forms?      no                  If so, please bring to you appointment   Social Determinants of Health   Financial Resource Strain: Not on file  Food Insecurity: Not on file  Transportation Needs: Not on file  Physical Activity: Not on file  Stress: Not on file  Social Connections: Not on file  Intimate Partner Violence: Not on file    Review of Systems: 12 system ROS is negative except as noted above with the addition of allergies, arthritis, back pain, fatigue, muscle cramps, night sweats.   Physical Exam: General:   Alert,  well-nourished, pleasant and cooperative in NAD Head:  Normocephalic and atraumatic. Eyes:  Sclera clear, no icterus.   Conjunctiva pink. Ears:  Normal auditory acuity. Nose:  No deformity, discharge,  or lesions. Mouth:  No deformity or lesions.   Neck:  Supple; no masses or thyromegaly. Lungs:  Clear throughout to auscultation.   No wheezes. Heart:  Regular rate and rhythm; no murmurs. Abdomen:  Soft, nontender, nondistended, normal bowel sounds, no rebound or guarding. No hepatosplenomegaly.   Rectal:  Deferred  Msk:  Symmetrical. No boney deformities LAD: No inguinal or umbilical LAD Extremities:  No clubbing or edema. Neurologic:  Alert and  oriented x4;  grossly nonfocal Skin:  Intact without significant lesions or rashes. Psych:  Alert and cooperative. Normal mood and affect.     Mayrin Schmuck L. Tarri Glenn, MD, MPH 12/13/2021, 12:47 PM

## 2021-12-13 NOTE — Patient Instructions (Addendum)
It was my pleasure to provide care to you today. Based on our discussion, I am providing you with my recommendations below:  RECOMMENDATION(S):    COLONOSCOPY:   You have been scheduled for a colonoscopy. Please follow written instructions given to you at your visit today.   PREP:   Please pick up your prep supplies at the pharmacy within the next 1-3 days.  INHALERS:   If you use inhalers (even only as needed), please bring them with you on the day of your procedure.  MEDICATIONS TO HOLD:  We will contact your provider to request permission for you to hold Plavix. Once we receive a response, you will be contacted by our office. If you do not hear from our office 1 week prior to your scheduled procedure, please contact our office at [phone].   COLONOSCOPY TIPS:  To reduce nausea and dehydration, stay well hydrated for 3-4 days prior to the exam.  To prevent skin/hemorrhoid irritation - prior to wiping, put A&Dointment or vaseline on the toilet paper. Keep a towel or pad on the bed.  BEFORE STARTING YOUR PREP, drink  64oz of clear liquids in the morning. This will help to flush the colon and will ensure you are well hydrated!!!!  NOTE - This is in addition to the fluids required for to complete your prep. Use of a flavored hard candy, such as grape Anise Salvo, can counteract some of the flavor of the prep and may prevent some nausea.    FOLLOW UP:  After your procedure, you will receive a call from my office staff regarding my recommendation for follow up.  BMI:  If you are age 74 or older, your body mass index should be between 23-30. Your Body mass index is 30.73 kg/m. If this is out of the aforementioned range listed, please consider follow up with your Primary Care Provider.  If you are age 61 or younger, your body mass index should be between 19-25. Your Body mass index is 30.73 kg/m. If this is out of the aformentioned range listed, please consider follow up  with your Primary Care Provider.   MY CHART:  The [facility] providers would like to encourage you to use [facility] to communicate with providers for non-urgent requests or questions.  Due to long hold times on the telephone, sending your provider a message by [facility] may be a faster and more efficient way to get a response.  Please allow 48 business hours for a response.  Please remember that this is for non-urgent requests.   Thank you for trusting me with your gastrointestinal care!    Thornton Park, MD, MPH

## 2021-12-17 ENCOUNTER — Encounter (HOSPITAL_BASED_OUTPATIENT_CLINIC_OR_DEPARTMENT_OTHER): Payer: Self-pay | Admitting: Physical Therapy

## 2021-12-17 ENCOUNTER — Ambulatory Visit (HOSPITAL_BASED_OUTPATIENT_CLINIC_OR_DEPARTMENT_OTHER): Payer: Medicare Other | Admitting: Physical Therapy

## 2021-12-17 DIAGNOSIS — R262 Difficulty in walking, not elsewhere classified: Secondary | ICD-10-CM

## 2021-12-17 DIAGNOSIS — M6281 Muscle weakness (generalized): Secondary | ICD-10-CM | POA: Diagnosis not present

## 2021-12-17 DIAGNOSIS — M542 Cervicalgia: Secondary | ICD-10-CM | POA: Diagnosis not present

## 2021-12-17 DIAGNOSIS — M5459 Other low back pain: Secondary | ICD-10-CM

## 2021-12-17 DIAGNOSIS — G8929 Other chronic pain: Secondary | ICD-10-CM | POA: Diagnosis not present

## 2021-12-17 DIAGNOSIS — M545 Low back pain, unspecified: Secondary | ICD-10-CM | POA: Diagnosis not present

## 2021-12-17 DIAGNOSIS — R252 Cramp and spasm: Secondary | ICD-10-CM

## 2021-12-17 NOTE — Therapy (Signed)
[facility] [address] [place], [place], [address] Phone: [phone]   Fax:  [phone]  Physical Therapy Treatment  Patient Details  Name: Dawn [name] MRN: [id number] Date of Birth: 1945/09/15 Referring Provider (PT): Dr Arlice Colt   Encounter Date: 12/17/2021    Past Medical History:  Diagnosis Date   Allergy    Dry eyes    Endometrial polyp    Essential hypertension 08/25/2018   GERD (gastroesophageal reflux disease)    Heart failure ([place])    History of hiatal hernia    History of kidney stones    Hot flashes, menopausal 10/13/2011   Estradiol started    Jaundice as teenager   no problems since   Memory loss 09/21/2019     1/2 of feet numb all the time   Multiple sclerosis ([place]) dx 2001   Neuropathy    Neuropathy    bilateral feet   Osteoarthritis    Sjogren's syndrome ([place]) dx oct 2021   sore muscvles, dry mouth and eyes   Stroke [facility])    Vertigo    Vision abnormalities     Past Surgical History:  Procedure Laterality Date   ABDOMINAL HYSTERECTOMY  2002   partial   colonscopy  2011   CORONARY STENT INTERVENTION N/A 07/18/2021   Procedure: CORONARY STENT INTERVENTION;  Surgeon: Jettie Booze, MD;  Location: [facility];  Service: Cardiovascular;  Laterality: N/A;   DILATATION & CURETTAGE/HYSTEROSCOPY WITH MYOSURE N/A 06/08/2020   Procedure: DILATATION & CURETTAGE/HYSTEROSCOPY/Polypectomy WITH MYOSURE;  Surgeon: Armandina Stammer, DO;  Location: [facility];  Service: Gynecology;  Laterality: N/A;   LEFT HEART CATH AND CORONARY ANGIOGRAPHY N/A 07/18/2021   Procedure: LEFT HEART CATH AND CORONARY ANGIOGRAPHY;  Surgeon: Jettie Booze, MD;  Location: [facility];  Service: Cardiovascular;  Laterality: N/A;   LUMBAR FUSION  2001   TOTAL HIP ARTHROPLASTY Right 01/17/2021   Procedure: TOTAL HIP ARTHROPLASTY ANTERIOR APPROACH;  Surgeon: Rod Can, MD;   [facility];  Service: Orthopedics;  Laterality: Right;   UPPER GI ENDOSCOPY  yrs ago    There were no vitals filed for this visit.   Subjective Assessment - 12/17/21 1151     Subjective Pt reports standing and walking are most aggravating activites. Hip is still moderately painful but improved over the last week.    Pertinent History MS    How long can you stand comfortably? Hurts upon standing    How long can you walk comfortably? Limited community ambulation    Currently in Pain? Yes    Pain Score 5     Pain Location Hip    Pain Orientation Right;Anterior    Pain Descriptors / Indicators Aching    Pain Type Chronic pain    Pain Onset More than a month ago    Pain Frequency Intermittent    Aggravating Factors  Pain worse with standing and walking.    Pain Relieving Factors Rest.    Effect of Pain on Daily Activities Difficulty with limited community distance ambulation.    Multiple Pain Sites No                         Trigger Point Dry-Needling  Treatment instructions: Expect mild to moderate muscle soreness. S/S of pneumothorax if dry needled over a lung field, and to seek immediate medical attention should they occur. Patient verbalized understanding of these instructions and education.  Patient Consent Given: Yes Education handout provided: Previously provided Muscles treated: glut med 4 spots in the glut med prone with .30x100 needle  Electrical stimulation performed: No Parameters: N/A Treatment response/outcome:  fair twitch to all needles; improved symptoms         Manual: Trigger point release to gluteal and anterior hip; Skilled palpation of trigger points.      LTR x145 bilateral  Supine hip abduction 2x15 red  LAQ 2x20 each leg  Standing march 2x5 right 2x10 left.     PT Short Term Goals - 11/29/21 1500       PT SHORT TERM GOAL #1   Title Patient will report a 50% reduction in lower back pain    Baseline no low back pain     Time 4    Period Weeks    Status Achieved      PT SHORT TERM GOAL #2   Title Patient will ambaulte 1200 with no significant spike in B/P or HR    Baseline 900 feet goal adjuested    Time 4    Period Weeks    Status Revised    Target Date 01/24/22      PT SHORT TERM GOAL #3   Title Patient increase right hip flexion and abducion strength by 5 lbs    Baseline more limited since recent onset of pain    Time 4    Period Weeks    Status On-going    Target Date 12/27/21      PT SHORT TERM GOAL #4   Title Patient will be indepdnent with basic endruance and right hip strengthening program    Baseline working on base program    Time 4    Period Weeks    Status On-going    Target Date 12/27/21      PT SHORT TERM GOAL #5   Title Patient will reduce TUG score by 4    Baseline not tested today    Time 4    Period Weeks    Status On-going    Target Date 12/27/21               PT Long Term Goals - 11/29/21 1501       PT LONG TERM GOAL #1   Title Patient will return to [facility] with a full program for her hip/ lower back/ and endurance    Baseline has not been able to 2nd to recent onset of pain    Time 8    Period Weeks    Status On-going    Target Date 01/24/22      PT LONG TERM GOAL #2   Title Patient will ambualte 1200' the 6 min walk test in order to improve community ambualtion    Baseline 900 goal revised    Time 8    Period Weeks    Status Revised    Target Date 01/24/22      PT LONG TERM GOAL #3   Title Patient will stand for 30 min without a significant increase in pain in order to perfrom ADL's    Baseline still having pain in the right hip    Time 8    Period Weeks    Status On-going    Target Date 01/24/22                   Plan - 12/17/21 1256     Clinical Impression Statement The patient had less tightness to  the anterior hip today. She reports she has been working hard on it at [employer] on her own. Therapy trialed TPDN today. She  had a fair twtich to her glut medius. She reported improved symptoms after. Therapy will continue to progress as tolerated.    Personal Factors and Comorbidities Comorbidity 1;Comorbidity 2    Comorbidities MS, OA    Examination-Activity Limitations Bed Mobility    Examination-Participation Restrictions Meal Prep;Cleaning;Community Activity;Laundry;Shop    Stability/Clinical Decision Making Stable/Uncomplicated    Clinical Decision Making Low    Rehab Potential Good    PT Frequency 1x / week    PT Duration 8 weeks    PT Treatment/Interventions ADLs/Self Care Home Management;Electrical Stimulation;Cryotherapy;Iontophoresis '4mg'$ /ml Dexamethasone;Moist Heat;Traction;DME Instruction;Neuromuscular re-education;Patient/family education;Manual techniques;Passive range of motion;Taping;Therapeutic activities;Therapeutic exercise;Balance training;Gait training;Stair training;Functional mobility training;Dry needling;Ultrasound    PT Next Visit Plan add in endurance exercises; review tolerance to interval training    PT Home Exercise Plan reviewed seated hamstring stretch and LTR. She has done those in the past.    Consulted and Agree with Plan of Care Patient             Patient will benefit from skilled therapeutic intervention in order to improve the following deficits and impairments:  Abnormal gait, Difficulty walking, Decreased range of motion, Decreased mobility, Decreased strength, Postural dysfunction, Pain, Increased muscle spasms, Decreased activity tolerance, Increased fascial restricitons  Visit Diagnosis: No diagnosis found.     Problem List Patient Active Problem List   Diagnosis Date Noted   Positive colorectal cancer screening using Cologuard test 11/14/2021   Coronary artery disease    Acute diastolic CHF (congestive heart failure) ([place]) 06/15/2021   Demand ischemia ([place]) 06/15/2021   Chest pain 06/14/2021   Elevated troponin 06/14/2021   Statin intolerance 04/19/2021    Osteoarthritis of right hip 01/17/2021   Status post THR (total hip replacement) 01/17/2021   Chronic bilateral low back pain without sciatica 10/02/2020   Chronic pain syndrome 06/20/2020   Post laminectomy syndrome 06/20/2020   Sjogren's disease ([place]) 05/23/2020   Primary osteoarthritis of both hands 05/23/2020   Primary osteoarthritis of both feet 05/23/2020   Sacroiliitis, not elsewhere classified ([place]) 12/22/2019   Other secondary scoliosis, lumbar region 12/22/2019   Spondylosis without myelopathy or radiculopathy, lumbar region 12/22/2019   Cervical radiculopathy 10/18/2019   Numbness 09/21/2019   Bilateral carpal tunnel syndrome 09/21/2019   High risk medication use 09/21/2019   Memory loss 09/21/2019   Essential hypertension 08/25/2018   Abnormal SPEP 02/19/2018   Hand pain 02/20/2017   Multiple joint pain 02/18/2017   Right elbow pain 12/09/2016   Disturbed cognition 06/25/2016   Trochanteric bursitis of left hip 02/18/2016   Sciatica, right side 10/30/2015   Bilateral arm pain 10/10/2015   Trochanteric bursitis of both hips 08/04/2014   Chronic fatigue 08/04/2014   Urinary frequency 08/04/2014   Abnormality of gait 08/04/2014   Unspecified visual disturbance 01/31/2013   Transient vision disturbance 01/31/2013   Routine general medical examination at a health care facility 11/04/2011   Colon polyps 11/03/2011   Hot flashes, menopausal 10/13/2011   POSTHERPETIC NEURALGIA 10/03/2009   DISPLCMT LUMBAR INTERVERT DISC W/O MYELOPATHY 09/05/2009   RENAL CALCULUS, RECURRENT 09/04/2009   Hyperlipidemia 08/31/2009   Multiple sclerosis ([place]) 08/31/2009   ALLERGIC RHINITIS 08/31/2009    Carney Living, PT 12/17/2021, 1:00 PM  [facility] [address] [place], [place], [address] Phone: [phone]   Fax:  [phone]  Name:  Dawn [name] MRN: [id number] Date of Birth: 06-30-1946

## 2021-12-19 ENCOUNTER — Other Ambulatory Visit: Payer: Self-pay | Admitting: Orthopedic Surgery

## 2021-12-19 DIAGNOSIS — E7800 Pure hypercholesterolemia, unspecified: Secondary | ICD-10-CM

## 2021-12-23 ENCOUNTER — Telehealth: Payer: Self-pay | Admitting: *Deleted

## 2021-12-23 NOTE — Telephone Encounter (Signed)
Dr. Harl Bowie to review.  Patient previously had DES to mid RCA on 07/18/2021.  She would have completed 20-monthcourse of DAPT by 01/16/2022.  Date of the GI procedures 01/27/2022 by which point she would have completed at least 636-monthourse of dual antiplatelet therapy.  Talking with the patient, she denies any recent exertional chest pain or worsening shortness of breath.  Overall, she is doing quite well from the cardiac perspective.  Unfortunately, patient had a positive Cologuard screening test and now require a colonoscopy.  Dr. BrHarl Bowieplease comment on whether the patient may hold Plavix for 5 days prior to colonoscopy on 01/27/2022

## 2021-12-23 NOTE — Telephone Encounter (Signed)
[facility] Pre-operative Risk Assessment     Request for surgical clearance:     Endoscopy Procedure  What type of surgery is being performed?     Colonoscopy  When is this surgery scheduled?     Monday 01/27/22  What type of clearance is required ?   Pharmacy  Are there any medications that need to be held prior to surgery and how long? Plavix 5 days  Practice name and name of physician performing surgery?      [facility]  What is your office phone and fax number?      Phone- [phone]  [phone]  Anesthesia type (None, local, MAC, general) ?       MAC

## 2021-12-24 ENCOUNTER — Other Ambulatory Visit: Payer: Self-pay | Admitting: Neurology

## 2021-12-24 NOTE — Telephone Encounter (Signed)
Last OV was on 08/01/21.  Next OV is scheduled for 02/05/22.  Last RX was written on 10/02/21 for 90 tabs.   [place] Drug Database has been reviewed. Fill date has been changed to 01/01/22 for compliance.

## 2021-12-27 ENCOUNTER — Ambulatory Visit (HOSPITAL_BASED_OUTPATIENT_CLINIC_OR_DEPARTMENT_OTHER): Payer: Medicare Other | Attending: Neurology | Admitting: Physical Therapy

## 2021-12-27 ENCOUNTER — Encounter (HOSPITAL_BASED_OUTPATIENT_CLINIC_OR_DEPARTMENT_OTHER): Payer: Self-pay | Admitting: Physical Therapy

## 2021-12-27 DIAGNOSIS — R252 Cramp and spasm: Secondary | ICD-10-CM | POA: Insufficient documentation

## 2021-12-27 DIAGNOSIS — M545 Low back pain, unspecified: Secondary | ICD-10-CM | POA: Insufficient documentation

## 2021-12-27 DIAGNOSIS — R262 Difficulty in walking, not elsewhere classified: Secondary | ICD-10-CM | POA: Insufficient documentation

## 2021-12-27 DIAGNOSIS — G8929 Other chronic pain: Secondary | ICD-10-CM | POA: Diagnosis not present

## 2021-12-27 DIAGNOSIS — M5459 Other low back pain: Secondary | ICD-10-CM | POA: Diagnosis not present

## 2021-12-27 DIAGNOSIS — M6281 Muscle weakness (generalized): Secondary | ICD-10-CM | POA: Insufficient documentation

## 2021-12-27 DIAGNOSIS — M542 Cervicalgia: Secondary | ICD-10-CM | POA: Diagnosis not present

## 2021-12-27 NOTE — Therapy (Signed)
[facility] [address] [place], [place], [address] Phone: [phone]   Fax:  [phone]  Physical Therapy Treatment  Patient Details  Name: Dawn [name] MRN: [id number] Date of Birth: 1946-04-16 Referring Provider (PT): Dr Arlice Colt   Encounter Date: 12/27/2021   PT End of Session - 12/27/21 1032     Visit Number 32    Number of Visits 19    Date for PT Re-Evaluation 01/24/22    Authorization Type progress note done on visit 29 next to be done on 41    PT Start Time 1015    PT Stop Time 1058    PT Time Calculation (min) 43 min    Activity Tolerance Patient tolerated treatment well    Behavior During Therapy [facility] for tasks assessed/performed             Past Medical History:  Diagnosis Date   Allergy    Dry eyes    Endometrial polyp    Essential hypertension 08/25/2018   GERD (gastroesophageal reflux disease)    Heart failure ([place])    History of hiatal hernia    History of kidney stones    Hot flashes, menopausal 10/13/2011   Estradiol started    Jaundice as teenager   no problems since   Memory loss 09/21/2019     1/2 of feet numb all the time   Multiple sclerosis ([place]) dx 2001   Neuropathy    Neuropathy    bilateral feet   Osteoarthritis    Sjogren's syndrome ([place]) dx oct 2021   sore muscvles, dry mouth and eyes   Stroke ([place])    Vertigo    Vision abnormalities     Past Surgical History:  Procedure Laterality Date   ABDOMINAL HYSTERECTOMY  2002   partial   colonscopy  2011   CORONARY STENT INTERVENTION N/A 07/18/2021   Procedure: CORONARY STENT INTERVENTION;  Surgeon: Jettie Booze, MD;  Location: [facility];  Service: Cardiovascular;  Laterality: N/A;   DILATATION & CURETTAGE/HYSTEROSCOPY WITH MYOSURE N/A 06/08/2020   Procedure: DILATATION & CURETTAGE/HYSTEROSCOPY/Polypectomy WITH MYOSURE;  Surgeon: Armandina Stammer, DO;  Location: [facility];  Service:  Gynecology;  Laterality: N/A;   LEFT HEART CATH AND CORONARY ANGIOGRAPHY N/A 07/18/2021   Procedure: LEFT HEART CATH AND CORONARY ANGIOGRAPHY;  Surgeon: Jettie Booze, MD;  Location: [facility];  Service: Cardiovascular;  Laterality: N/A;   LUMBAR FUSION  2001   TOTAL HIP ARTHROPLASTY Right 01/17/2021   Procedure: TOTAL HIP ARTHROPLASTY ANTERIOR APPROACH;  Surgeon: Rod Can, MD;  [facility];  Service: Orthopedics;  Laterality: Right;   UPPER GI ENDOSCOPY  yrs ago    There were no vitals filed for this visit.            Manual: roller to anterior hip; trigger point assessment.   Quad set 2x10 each leg;  PPT x10 with cuing  Bridge ( low ) 2x10   LTR x20   Seated clamshell x20 red  Standing weight shift x20  Standing low march 2x10 each leg                       PT Short Term Goals - 11/29/21 1500       PT SHORT TERM GOAL #1   Title Patient will report a 50% reduction in lower back pain    Baseline no low back pain    Time  4    Period Weeks    Status Achieved      PT SHORT TERM GOAL #2   Title Patient will ambaulte 1200 with no significant spike in B/P or HR    Baseline 900 feet goal adjuested    Time 4    Period Weeks    Status Revised    Target Date 01/24/22      PT SHORT TERM GOAL #3   Title Patient increase right hip flexion and abducion strength by 5 lbs    Baseline more limited since recent onset of pain    Time 4    Period Weeks    Status On-going    Target Date 12/27/21      PT SHORT TERM GOAL #4   Title Patient will be indepdnent with basic endruance and right hip strengthening program    Baseline working on base program    Time 4    Period Weeks    Status On-going    Target Date 12/27/21      PT SHORT TERM GOAL #5   Title Patient will reduce TUG score by 4    Baseline not tested today    Time 4    Period Weeks    Status On-going    Target Date 12/27/21               PT Long Term  Goals - 11/29/21 1501       PT LONG TERM GOAL #1   Title Patient will return to [facility] with a full program for her hip/ lower back/ and endurance    Baseline has not been able to 2nd to recent onset of pain    Time 8    Period Weeks    Status On-going    Target Date 01/24/22      PT LONG TERM GOAL #2   Title Patient will ambualte 1200' the 6 min walk test in order to improve community ambualtion    Baseline 900 goal revised    Time 8    Period Weeks    Status Revised    Target Date 01/24/22      PT LONG TERM GOAL #3   Title Patient will stand for 30 min without a significant increase in pain in order to perfrom ADL's    Baseline still having pain in the right hip    Time 8    Period Weeks    Status On-going    Target Date 01/24/22                   Plan - 12/27/21 1055     Clinical Impression Statement The patient tolerated treatment well today. We were able to progress her more into her exercises. She had no signiifcant pain with exercises. She had some stability issues with low march but she was able to do it without significant pain. her right leg was more stable then the left today.    Personal Factors and Comorbidities Comorbidity 1;Comorbidity 2    Comorbidities MS, OA    Examination-Activity Limitations Bed Mobility    Examination-Participation Restrictions Meal Prep;Cleaning;Community Activity;Laundry;Shop             Patient will benefit from skilled therapeutic intervention in order to improve the following deficits and impairments:  Abnormal gait, Difficulty walking, Decreased range of motion, Decreased mobility, Decreased strength, Postural dysfunction, Pain, Increased muscle spasms, Decreased activity tolerance, Increased fascial restricitons  Visit Diagnosis: No diagnosis found.  Problem List Patient Active Problem List   Diagnosis Date Noted   Positive colorectal cancer screening using Cologuard test 11/14/2021   Coronary artery  disease    Acute diastolic CHF (congestive heart failure) ([place]) 06/15/2021   Demand ischemia ([place]) 06/15/2021   Chest pain 06/14/2021   Elevated troponin 06/14/2021   Statin intolerance 04/19/2021   Osteoarthritis of right hip 01/17/2021   Status post THR (total hip replacement) 01/17/2021   Chronic bilateral low back pain without sciatica 10/02/2020   Chronic pain syndrome 06/20/2020   Post laminectomy syndrome 06/20/2020   Sjogren's disease ([place]) 05/23/2020   Primary osteoarthritis of both hands 05/23/2020   Primary osteoarthritis of both feet 05/23/2020   Sacroiliitis, not elsewhere classified ([place]) 12/22/2019   Other secondary scoliosis, lumbar region 12/22/2019   Spondylosis without myelopathy or radiculopathy, lumbar region 12/22/2019   Cervical radiculopathy 10/18/2019   Numbness 09/21/2019   Bilateral carpal tunnel syndrome 09/21/2019   High risk medication use 09/21/2019   Memory loss 09/21/2019   Essential hypertension 08/25/2018   Abnormal SPEP 02/19/2018   Hand pain 02/20/2017   Multiple joint pain 02/18/2017   Right elbow pain 12/09/2016   Disturbed cognition 06/25/2016   Trochanteric bursitis of left hip 02/18/2016   Sciatica, right side 10/30/2015   Bilateral arm pain 10/10/2015   Trochanteric bursitis of both hips 08/04/2014   Chronic fatigue 08/04/2014   Urinary frequency 08/04/2014   Abnormality of gait 08/04/2014   Unspecified visual disturbance 01/31/2013   Transient vision disturbance 01/31/2013   Routine general medical examination at a health care facility 11/04/2011   Colon polyps 11/03/2011   Hot flashes, menopausal 10/13/2011   POSTHERPETIC NEURALGIA 10/03/2009   DISPLCMT LUMBAR INTERVERT DISC W/O MYELOPATHY 09/05/2009   RENAL CALCULUS, RECURRENT 09/04/2009   Hyperlipidemia 08/31/2009   Multiple sclerosis ([place]) 08/31/2009   ALLERGIC RHINITIS 08/31/2009    Carney Living, PT 12/27/2021, 11:01 AM  [facility] [address] [place], [place], [address] Phone: [phone]   Fax:  [phone]  Name: Dawn [name] MRN: [id number] Date of Birth: 1946-05-16

## 2021-12-27 NOTE — Therapy (Signed)
[facility] [address] [place], [place], [address] Phone: [phone]   Fax:  [phone]  Physical Therapy Treatment  Patient Details  Name: Dawn [name] MRN: [id number] Date of Birth: 1946-06-16 Referring Provider (PT): Dr Arlice Colt   Encounter Date: 12/27/2021    Past Medical History:  Diagnosis Date   Allergy    Dry eyes    Endometrial polyp    Essential hypertension 08/25/2018   GERD (gastroesophageal reflux disease)    Heart failure ([place])    History of hiatal hernia    History of kidney stones    Hot flashes, menopausal 10/13/2011   Estradiol started    Jaundice as teenager   no problems since   Memory loss 09/21/2019     1/2 of feet numb all the time   Multiple sclerosis ([place]) dx 2001   Neuropathy    Neuropathy    bilateral feet   Osteoarthritis    Sjogren's syndrome ([place]) dx oct 2021   sore muscvles, dry mouth and eyes   Stroke [facility])    Vertigo    Vision abnormalities     Past Surgical History:  Procedure Laterality Date   ABDOMINAL HYSTERECTOMY  2002   partial   colonscopy  2011   CORONARY STENT INTERVENTION N/A 07/18/2021   Procedure: CORONARY STENT INTERVENTION;  Surgeon: Jettie Booze, MD;  Location: [facility];  Service: Cardiovascular;  Laterality: N/A;   DILATATION & CURETTAGE/HYSTEROSCOPY WITH MYOSURE N/A 06/08/2020   Procedure: DILATATION & CURETTAGE/HYSTEROSCOPY/Polypectomy WITH MYOSURE;  Surgeon: Armandina Stammer, DO;  Location: [facility];  Service: Gynecology;  Laterality: N/A;   LEFT HEART CATH AND CORONARY ANGIOGRAPHY N/A 07/18/2021   Procedure: LEFT HEART CATH AND CORONARY ANGIOGRAPHY;  Surgeon: Jettie Booze, MD;  Location: [facility];  Service: Cardiovascular;  Laterality: N/A;   LUMBAR FUSION  2001   TOTAL HIP ARTHROPLASTY Right 01/17/2021   Procedure: TOTAL HIP ARTHROPLASTY ANTERIOR APPROACH;  Surgeon: Rod Can, MD;   [facility]: [facility];  Service: Orthopedics;  Laterality: Right;   UPPER GI ENDOSCOPY  yrs ago    There were no vitals filed for this visit.    Treatment  6/9  Warmup: NuStep on L3, 17mn     5/30  Trigger Point Dry-Needling  Treatment instructions: Expect mild to moderate muscle soreness. S/S of pneumothorax if dry needled over a lung field, and to seek immediate medical attention should they occur. Patient verbalized understanding of these instructions and education.  Patient Consent Given: Yes Education handout provided: Previously provided Muscles treated: glut med 4 spots in the glut med prone with .30x100 needle  Electrical stimulation performed: No Parameters: N/A Treatment response/outcome:  fair twitch to all needles; improved symptoms         Manual: Trigger point release to gluteal and anterior hip; Skilled palpation of trigger points.      LTR x145 bilateral  Supine hip abduction 2x15 red  LAQ 2x20 each leg  Standing march 2x5 right 2x10 left.     PT Short Term Goals - 11/29/21 1500       PT SHORT TERM GOAL #1   Title Patient will report a 50% reduction in lower back pain    Baseline no low back pain    Time 4    Period Weeks    Status Achieved      PT SHORT TERM GOAL #2   Title Patient will ambaulte 1200 with no  significant spike in B/P or HR    Baseline 900 feet goal adjuested    Time 4    Period Weeks    Status Revised    Target Date 01/24/22      PT SHORT TERM GOAL #3   Title Patient increase right hip flexion and abducion strength by 5 lbs    Baseline more limited since recent onset of pain    Time 4    Period Weeks    Status On-going    Target Date 12/27/21      PT SHORT TERM GOAL #4   Title Patient will be indepdnent with basic endruance and right hip strengthening program    Baseline working on base program    Time 4    Period Weeks    Status On-going    Target Date 12/27/21      PT SHORT TERM GOAL #5   Title Patient will  reduce TUG score by 4    Baseline not tested today    Time 4    Period Weeks    Status On-going    Target Date 12/27/21               PT Long Term Goals - 11/29/21 1501       PT LONG TERM GOAL #1   Title Patient will return to [facility] with a full program for her hip/ lower back/ and endurance    Baseline has not been able to 2nd to recent onset of pain    Time 8    Period Weeks    Status On-going    Target Date 01/24/22      PT LONG TERM GOAL #2   Title Patient will ambualte 1200' the 6 min walk test in order to improve community ambualtion    Baseline 900 goal revised    Time 8    Period Weeks    Status Revised    Target Date 01/24/22      PT LONG TERM GOAL #3   Title Patient will stand for 30 min without a significant increase in pain in order to perfrom ADL's    Baseline still having pain in the right hip    Time 8    Period Weeks    Status On-going    Target Date 01/24/22                     Patient will benefit from skilled therapeutic intervention in order to improve the following deficits and impairments:     Visit Diagnosis: No diagnosis found.     Problem List Patient Active Problem List   Diagnosis Date Noted   Positive colorectal cancer screening using Cologuard test 11/14/2021   Coronary artery disease    Acute diastolic CHF (congestive heart failure) ([place]) 06/15/2021   Demand ischemia ([place]) 06/15/2021   Chest pain 06/14/2021   Elevated troponin 06/14/2021   Statin intolerance 04/19/2021   Osteoarthritis of right hip 01/17/2021   Status post THR (total hip replacement) 01/17/2021   Chronic bilateral low back pain without sciatica 10/02/2020   Chronic pain syndrome 06/20/2020   Post laminectomy syndrome 06/20/2020   Sjogren's disease ([place]) 05/23/2020   Primary osteoarthritis of both hands 05/23/2020   Primary osteoarthritis of both feet 05/23/2020   Sacroiliitis, not elsewhere classified ([place]) 12/22/2019   Other  secondary scoliosis, lumbar region 12/22/2019   Spondylosis without myelopathy or radiculopathy, lumbar region 12/22/2019   Cervical radiculopathy 10/18/2019  Numbness 09/21/2019   Bilateral carpal tunnel syndrome 09/21/2019   High risk medication use 09/21/2019   Memory loss 09/21/2019   Essential hypertension 08/25/2018   Abnormal SPEP 02/19/2018   Hand pain 02/20/2017   Multiple joint pain 02/18/2017   Right elbow pain 12/09/2016   Disturbed cognition 06/25/2016   Trochanteric bursitis of left hip 02/18/2016   Sciatica, right side 10/30/2015   Bilateral arm pain 10/10/2015   Trochanteric bursitis of both hips 08/04/2014   Chronic fatigue 08/04/2014   Urinary frequency 08/04/2014   Abnormality of gait 08/04/2014   Unspecified visual disturbance 01/31/2013   Transient vision disturbance 01/31/2013   Routine general medical examination at a health care facility 11/04/2011   Colon polyps 11/03/2011   Hot flashes, menopausal 10/13/2011   POSTHERPETIC NEURALGIA 10/03/2009   DISPLCMT LUMBAR INTERVERT DISC W/O MYELOPATHY 09/05/2009   RENAL CALCULUS, RECURRENT 09/04/2009   Hyperlipidemia 08/31/2009   Multiple sclerosis ([place]) 08/31/2009   ALLERGIC RHINITIS 08/31/2009    Carney Living, PT 12/27/2021, 10:20 AM  [facility] [place], [place], [address] Phone: [phone]   Fax:  [phone]  Name: Dawn [name] MRN: [id number] Date of Birth: 06/11/1946

## 2021-12-30 NOTE — Telephone Encounter (Signed)
Patient informed. 

## 2022-01-02 ENCOUNTER — Ambulatory Visit (HOSPITAL_BASED_OUTPATIENT_CLINIC_OR_DEPARTMENT_OTHER): Payer: Medicare Other | Admitting: Physical Therapy

## 2022-01-02 ENCOUNTER — Encounter (HOSPITAL_BASED_OUTPATIENT_CLINIC_OR_DEPARTMENT_OTHER): Payer: Self-pay | Admitting: Physical Therapy

## 2022-01-02 DIAGNOSIS — M5459 Other low back pain: Secondary | ICD-10-CM

## 2022-01-02 DIAGNOSIS — R252 Cramp and spasm: Secondary | ICD-10-CM | POA: Diagnosis not present

## 2022-01-02 DIAGNOSIS — M542 Cervicalgia: Secondary | ICD-10-CM | POA: Diagnosis not present

## 2022-01-02 DIAGNOSIS — R262 Difficulty in walking, not elsewhere classified: Secondary | ICD-10-CM | POA: Diagnosis not present

## 2022-01-02 DIAGNOSIS — M545 Low back pain, unspecified: Secondary | ICD-10-CM | POA: Diagnosis not present

## 2022-01-02 DIAGNOSIS — M6281 Muscle weakness (generalized): Secondary | ICD-10-CM

## 2022-01-02 NOTE — Therapy (Signed)
[facility] [address] [place], [place], [address] Phone: [phone]   Fax:  [phone]  Physical Therapy Treatment  Patient Details  Name: Dawn [name] MRN: [id number] Date of Birth: 1945/11/03 Referring Provider (PT): Dr Arlice Colt   Encounter Date: 01/02/2022   PT End of Session - 01/02/22 1024     Visit Number 33    Number of Visits 66    Date for PT Re-Evaluation 01/24/22    Authorization Type progress note done on visit 29 next to be done on 97    PT Start Time 1015    PT Stop Time 1057    PT Time Calculation (min) 42 min    Activity Tolerance Patient tolerated treatment well    Behavior During Therapy [facility] for tasks assessed/performed             Past Medical History:  Diagnosis Date   Allergy    Dry eyes    Endometrial polyp    Essential hypertension 08/25/2018   GERD (gastroesophageal reflux disease)    Heart failure ([place])    History of hiatal hernia    History of kidney stones    Hot flashes, menopausal 10/13/2011   Estradiol started    Jaundice as teenager   no problems since   Memory loss 09/21/2019     1/2 of feet numb all the time   Multiple sclerosis ([place]) dx 2001   Neuropathy    Neuropathy    bilateral feet   Osteoarthritis    Sjogren's syndrome ([place]) dx oct 2021   sore muscvles, dry mouth and eyes   Stroke ([place])    Vertigo    Vision abnormalities     Past Surgical History:  Procedure Laterality Date   ABDOMINAL HYSTERECTOMY  2002   partial   colonscopy  2011   CORONARY STENT INTERVENTION N/A 07/18/2021   Procedure: CORONARY STENT INTERVENTION;  Surgeon: Jettie Booze, MD;  Location: [facility];  Service: Cardiovascular;  Laterality: N/A;   DILATATION & CURETTAGE/HYSTEROSCOPY WITH MYOSURE N/A 06/08/2020   Procedure: DILATATION & CURETTAGE/HYSTEROSCOPY/Polypectomy WITH MYOSURE;  Surgeon: Armandina Stammer, DO;  Location: [facility];  Service:  Gynecology;  Laterality: N/A;   LEFT HEART CATH AND CORONARY ANGIOGRAPHY N/A 07/18/2021   Procedure: LEFT HEART CATH AND CORONARY ANGIOGRAPHY;  Surgeon: Jettie Booze, MD;  Location: [facility];  Service: Cardiovascular;  Laterality: N/A;   LUMBAR FUSION  2001   TOTAL HIP ARTHROPLASTY Right 01/17/2021   Procedure: TOTAL HIP ARTHROPLASTY ANTERIOR APPROACH;  Surgeon: Rod Can, MD;  [facility];  Service: Orthopedics;  Laterality: Right;   UPPER GI ENDOSCOPY  yrs ago    There were no vitals filed for this visit.   Subjective Assessment - 01/02/22 1025     Subjective The patient reports she was a little sore after last time but it feels better now. She has been working on her exercises.    Pertinent History MS    How long can you stand comfortably? Hurts upon standing    How long can you walk comfortably? Limited community ambulation    Diagnostic tests Nothing recent    Patient Stated Goals to have less pain with ambualtion    Currently in Pain? Yes    Pain Score 3     Pain Location Hip    Pain Orientation Right    Pain Descriptors / Indicators Aching    Pain Onset More  than a month ago    Pain Frequency Intermittent    Aggravating Factors  pain standing and walking    Pain Relieving Factors rest    Effect of Pain on Daily Activities difficulty with communtiy ambualtion    Multiple Pain Sites No                 Manual: roller to anterior quad/lateral hip; trigger point assessment.   LTR x20 Bridge 2x12  Seated clamshell x20 green  Supine quad set -  LAQ - 2x10 1lbs   Standing march 2x20 1st set no weight; sencond set 1lbs  Heel raises x20                      PT Education - 01/02/22 1027     Education Details reviewed tehcnique with ther-ex    Person(s) Educated Patient    Methods Explanation;Demonstration;Tactile cues;Verbal cues    Comprehension Verbalized understanding;Returned demonstration;Verbal cues  required;Tactile cues required              PT Short Term Goals - 11/29/21 1500       PT SHORT TERM GOAL #1   Title Patient will report a 50% reduction in lower back pain    Baseline no low back pain    Time 4    Period Weeks    Status Achieved      PT SHORT TERM GOAL #2   Title Patient will ambaulte 1200 with no significant spike in B/P or HR    Baseline 900 feet goal adjuested    Time 4    Period Weeks    Status Revised    Target Date 01/24/22      PT SHORT TERM GOAL #3   Title Patient increase right hip flexion and abducion strength by 5 lbs    Baseline more limited since recent onset of pain    Time 4    Period Weeks    Status On-going    Target Date 12/27/21      PT SHORT TERM GOAL #4   Title Patient will be indepdnent with basic endruance and right hip strengthening program    Baseline working on base program    Time 4    Period Weeks    Status On-going    Target Date 12/27/21      PT SHORT TERM GOAL #5   Title Patient will reduce TUG score by 4    Baseline not tested today    Time 4    Period Weeks    Status On-going    Target Date 12/27/21               PT Long Term Goals - 11/29/21 1501       PT LONG TERM GOAL #1   Title Patient will [facility] to [facility] with a full program for her hip/ lower back/ and endurance    Baseline has not been able to 2nd to recent onset of pain    Time 8    Period Weeks    Status On-going    Target Date 01/24/22      PT LONG TERM GOAL #2   Title Patient will ambualte 1200' the 6 min walk test in order to improve community ambualtion    Baseline 900 goal revised    Time 8    Period Weeks    Status Revised    Target Date 01/24/22      PT LONG  TERM GOAL #3   Title Patient will stand for 30 min without a significant increase in pain in order to perfrom ADL's    Baseline still having pain in the right hip    Time 8    Period Weeks    Status On-going    Target Date 01/24/22                    Plan - 01/02/22 1115     Clinical Impression Statement Today we were able to advance the patients weight. Thisis the first time since her replacement and her MI were we have been able to advance weight. She did very well. Her stability has improved with her weight bearing exercises. We will continue to advance as tolerated. She continues to have anterior tightness but it has improved.    Personal Factors and Comorbidities Comorbidity 1;Comorbidity 2    Comorbidities MS, OA    Examination-Activity Limitations Bed Mobility    Examination-Participation Restrictions Meal Prep;Cleaning;Community Activity;Laundry;Shop    Stability/Clinical Decision Making Stable/Uncomplicated    Clinical Decision Making Low    Rehab Potential Good    PT Frequency 1x / week    PT Duration 8 weeks    PT Treatment/Interventions ADLs/Self Care Home Management;Electrical Stimulation;Cryotherapy;Iontophoresis '4mg'$ /ml Dexamethasone;Moist Heat;Traction;DME Instruction;Neuromuscular re-education;Patient/family education;Manual techniques;Passive range of motion;Taping;Therapeutic activities;Therapeutic exercise;Balance training;Gait training;Stair training;Functional mobility training;Dry needling;Ultrasound    PT Next Visit Plan add in endurance exercises; review tolerance to interval training    PT Home Exercise Plan reviewed seated hamstring stretch and LTR. She has done those in the past.    Consulted and Agree with Plan of Care Patient             Patient will benefit from skilled therapeutic intervention in order to improve the following deficits and impairments:  Abnormal gait, Difficulty walking, Decreased range of motion, Decreased mobility, Decreased strength, Postural dysfunction, Pain, Increased muscle spasms, Decreased activity tolerance, Increased fascial restricitons  Visit Diagnosis: Difficulty in walking, not elsewhere classified  Cramp and spasm  Other low back pain  Muscle weakness  (generalized)  Cervicalgia  Chronic bilateral low back pain without sciatica     Problem List Patient Active Problem List   Diagnosis Date Noted   Positive colorectal cancer screening using Cologuard test 11/14/2021   Coronary artery disease    Acute diastolic CHF (congestive heart failure) ([place]) 06/15/2021   Demand ischemia ([place]) 06/15/2021   Chest pain 06/14/2021   Elevated troponin 06/14/2021   Statin intolerance 04/19/2021   Osteoarthritis of right hip 01/17/2021   Status post THR (total hip replacement) 01/17/2021   Chronic bilateral low back pain without sciatica 10/02/2020   Chronic pain syndrome 06/20/2020   Post laminectomy syndrome 06/20/2020   Sjogren's disease ([place]) 05/23/2020   Primary osteoarthritis of both hands 05/23/2020   Primary osteoarthritis of both feet 05/23/2020   Sacroiliitis, not elsewhere classified ([place]) 12/22/2019   Other secondary scoliosis, lumbar region 12/22/2019   Spondylosis without myelopathy or radiculopathy, lumbar region 12/22/2019   Cervical radiculopathy 10/18/2019   Numbness 09/21/2019   Bilateral carpal tunnel syndrome 09/21/2019   High risk medication use 09/21/2019   Memory loss 09/21/2019   Essential hypertension 08/25/2018   Abnormal SPEP 02/19/2018   Hand pain 02/20/2017   Multiple joint pain 02/18/2017   Right elbow pain 12/09/2016   Disturbed cognition 06/25/2016   Trochanteric bursitis of left hip 02/18/2016   Sciatica, right side 10/30/2015   Bilateral arm pain 10/10/2015   Trochanteric  bursitis of both hips 08/04/2014   Chronic fatigue 08/04/2014   Urinary frequency 08/04/2014   Abnormality of gait 08/04/2014   Unspecified visual disturbance 01/31/2013   Transient vision disturbance 01/31/2013   Routine general medical examination at a health care facility 11/04/2011   Colon polyps 11/03/2011   Hot flashes, menopausal 10/13/2011   POSTHERPETIC NEURALGIA 10/03/2009   DISPLCMT LUMBAR INTERVERT DISC W/O MYELOPATHY  09/05/2009   RENAL CALCULUS, RECURRENT 09/04/2009   Hyperlipidemia 08/31/2009   Multiple sclerosis ([place]) 08/31/2009   ALLERGIC RHINITIS 08/31/2009    Carney Living, PT 01/02/2022, 11:16 AM  [facility] [address] [place], [place], [address] Phone: [phone]   Fax:  [phone]  Name: BRAYLON LEMMONS MRN: [id number] Date of Birth: [birth year]

## 2022-01-04 ENCOUNTER — Other Ambulatory Visit: Payer: Self-pay | Admitting: Family

## 2022-01-04 DIAGNOSIS — I10 Essential (primary) hypertension: Secondary | ICD-10-CM

## 2022-01-07 ENCOUNTER — Encounter: Payer: Self-pay | Admitting: *Deleted

## 2022-01-07 ENCOUNTER — Other Ambulatory Visit: Payer: Self-pay | Admitting: *Deleted

## 2022-01-07 ENCOUNTER — Telehealth: Payer: Self-pay | Admitting: Internal Medicine

## 2022-01-07 NOTE — Patient Instructions (Signed)
Visit Information  Thank you for taking time to visit with me today. Please don't hesitate to contact me if I can be of assistance to you before our next scheduled telephone appointment.  Following are the goals we discussed today:  Take all medications as prescribed Attend all scheduled provider appointments Call pharmacy for medication refills 3-7 days in advance of running out of medications Attend church or other social activities Perform all self care activities independently  Perform IADL's (shopping, preparing meals, housekeeping, managing finances) independently Call provider office for new concerns or questions  check blood pressure daily choose a place to take my blood pressure (home, clinic or office, retail store) write blood pressure results in a log or diary learn about high blood pressure keep a blood pressure log take blood pressure log to all doctor appointments call doctor for signs and symptoms of high blood pressure keep all doctor appointments take medications for blood pressure exactly as prescribed report new symptoms to your doctor eat more whole grains, fruits and vegetables, lean meats and healthy fats

## 2022-01-07 NOTE — Patient Outreach (Signed)
[facility]) Care Management Telephonic RN Care Manager Note   01/07/2022 Name:  Dawn [name] MRN:  [id number] DOB:  1946/06/19  Summary: Initial discussion related to HTN episode that occurred yesterday and pt call the [facility] RN hotline. Pt reports her bp decreased to 138/74  from 168/78 on yesterday prior to calling the [facility] RN hotline. Pt will contact her provider this morning for further intervention if needed. Pt also mentioned a painful "bump" on her eye-lid. Again encouraged pt to contact her provider prior to any OTC or remedies for interventions.  Pt enrolled into the HTN program and educated accordingly with today's assessment.  Recommendations/Changes made from today's visit: Will strongly encouraged pt follow up with her provider concern ing the discussed and ongoing issues. Will send new [facility] calendar and encouraged pt to document all readings of her bp in the calender for her providers.   Subjective: Dawn [name] is an 76 y.o. year old female who is a primary patient of [name], Dinah C, NP. The care management team was consulted for assistance with care management and/or care coordination needs.    Telephonic RN Care Manager completed Telephone Visit today.  Objective:   Medications Reviewed Today     Reviewed by Tobi Bastos, RN (Registered Nurse) on 01/07/22 at [phone]  Med List Status: <None>   Medication Order Taking? Sig Documenting Provider Last Dose Status Informant  aspirin EC 81 MG tablet [id number] Yes Take 81 mg by mouth daily. Swallow whole. [provider] Taking Active Self  carvedilol (COREG) 6.25 MG tablet [id number] Yes Take 1 tablet (6.25 mg total) by mouth 2 (two) times daily with a meal. Branch, Royetta Crochet, MD Taking Active   cholecalciferol (VITAMIN D) 25 MCG (1000 UNIT) tablet [id number] Yes Take 2,000 Units by mouth daily. [provider] Taking Active Self  clonazePAM (KLONOPIN) 0.5 MG tablet [id number] Yes TAKE 1  TABLET(0.5 MG) BY MOUTH AT BEDTIME [name], [name], MD Taking Active Self  clopidogrel (PLAVIX) 75 MG tablet [id number] Yes Take 75 mg by mouth daily. [provider] Taking Active   fexofenadine (ALLEGRA) 180 MG tablet [id number] Yes Take 180 mg by mouth at bedtime.  [provider] Taking Active Self  Flaxseed, Linseed, (FLAXSEED OIL PO) [id number] Yes Take 5-10 mLs by mouth 3 (three) times a week. Power [provider] Taking Active Self  hydrALAZINE (APRESOLINE) 10 MG tablet [id number] Yes TAKE 1 TABLET BY MOUTH EVERY 8 HOURS. Janina Mayo, MD Taking Active   lamoTRIgine (LAMICTAL) 25 MG tablet [id number] Yes Take 50 mg by mouth 2 (two) times daily. [provider] Taking Active   leflunomide (ARAVA) 20 MG tablet [id number] Yes TAKE 1 TABLET(20 MG) BY MOUTH DAILY [name], [name], MD Taking Active   Lidocaine 4 % PTCH [id number] Yes Place 1 patch onto the skin daily as needed (mild pain). Remove & Discard patch within 12 hours or as directed by MD [provider] Taking Active Self  losartan (COZAAR) 100 MG tablet [id number] Yes TAKE 1 TABLET(100 MG) BY MOUTH DAILY [name], Dinah C, NP Taking Active   melatonin 5 MG TABS [id number] Yes Take 5 mg by mouth at bedtime as needed. [provider] Taking Active   modafinil (PROVIGIL) 200 MG tablet [id number] Yes Take 1 tablet (200 mg total) by mouth in the morning. [name], [name], MD Taking Active   nitroGLYCERIN (NITROSTAT) 0.4 MG SL tablet [id number] Yes Place 1 tablet (0.4 mg total) under the  tongue every 5 (five) minutes as needed for chest pain. Allie Bossier, MD Taking Active Self  pantoprazole (PROTONIX) 40 MG tablet [id number] Yes Take 1 tablet (40 mg total) by mouth daily. Janina Mayo, MD Taking Active   rosuvastatin (CRESTOR) 10 MG tablet [id number] Yes TAKE 1 TABLET BY MOUTH DAILY Janina Mayo, MD Taking Active   solifenacin (VESICARE) 5 MG tablet [id number] Yes TAKE 1 TABLET(5 MG) BY MOUTH  DAILY [name], [name], MD Taking Active   traMADol (ULTRAM) 50 MG tablet [id number] Yes Take 50 mg by mouth every 6 (six) hours as needed for moderate pain. [provider] Taking Active              SDOH:  (Social Determinants of Health) assessments and interventions performed:  SDOH Interventions    Flowsheet Row Most Recent Value  SDOH Interventions   Food Insecurity Interventions Intervention Not Indicated  Transportation Interventions Intervention Not Indicated        Care Plan  Review of patient past medical history, allergies, medications, health status, including review of consultants reports, laboratory and other test data, was performed as part of comprehensive evaluation for care management services.   Care Plan : RN Care Manager plan of care  Updates made by Tobi Bastos, RN since 01/07/2022 12:00 AM     Problem: Knowledge Deficit related to HTN and care coordination needs   Priority: High     Long-Range Goal: Development plan of care for management of HTN   Start Date: 01/07/2022  Expected End Date: 06/19/2022  Priority: High  Note:   Current Barriers:  Knowledge Deficits related to plan of care for management of HTN   RNCM Clinical Goal(s):  Patient will verbalize understanding of plan for management of HTN as evidenced by self report and chart review take all medications exactly as prescribed and will call provider for medication related questions as evidenced by self report and chart review  through collaboration with RN Care manager, provider, and care team.   Interventions: Inter-disciplinary care team collaboration (see longitudinal plan of care) Evaluation of current treatment plan related to  self management and patient's adherence to plan as established by provider   Hypertension Interventions:  (Status:  New goal.) Long Term Goal Last practice recorded BP readings:  BP Readings from Last 3 Encounters:  12/13/21 130/84  10/18/21  110/80  09/27/21 128/80  Most recent eGFR/CrCl:  Lab Results  Component Value Date   EGFR 90 10/15/2021    No components found for: "CRCL"  Evaluation of current treatment plan related to hypertension self management and patient's adherence to plan as established by provider Provided education to patient re: stroke prevention, s/s of heart attack and stroke Reviewed medications with patient and discussed importance of compliance Discussed plans with patient for ongoing care management follow up and provided patient with direct contact information for care management team Advised patient, providing education and rationale, to monitor blood pressure daily and record, calling PCP for findings outside established parameters Advised patient to discuss CAD w/ recent elevation of blood pressures and eye provider concerning eye irritation that has been painful with provider Discussed complications of poorly controlled blood pressure such as heart disease, stroke, circulatory complications, vision complications, kidney impairment, sexual dysfunction Screening for signs and symptoms of depression related to chronic disease state   Patient Goals/Self-Care Activities: Take all medications as prescribed Attend all scheduled provider appointments Call pharmacy for medication refills 3-7 days in advance of running  out of medications Attend church or other social activities Perform all self care activities independently  Perform IADL's (shopping, preparing meals, housekeeping, managing finances) independently Call provider office for new concerns or questions  check blood pressure daily choose a place to take my blood pressure (home, clinic or office, retail store) write blood pressure results in a log or diary learn about high blood pressure keep a blood pressure log take blood pressure log to all doctor appointments call doctor for signs and symptoms of high blood pressure keep all doctor  appointments take medications for blood pressure exactly as prescribed report new symptoms to your doctor eat more whole grains, fruits and vegetables, lean meats and healthy fats  Follow Up Plan:  Telephone follow up appointment with care management team member scheduled for:  July 2023 The patient has been provided with contact information for the care management team and has been advised to call with any health related questions or concerns.        Raina Mina, RN Care Management Coordinator [place] Office [phone]

## 2022-01-07 NOTE — Telephone Encounter (Signed)
Pt c/o BP issue: STAT if pt c/o blurred vision, one-sided weakness or slurred speech  1. What are your last 5 BP readings?  138/74  61 this morning 6/20  163/78  62 yesterday 6/19 137/66  62 Sunday 6/18  2. Are you having any other symptoms (ex. Dizziness, headache, blurred vision, passed out)? No other symptoms.   3. What is your BP issue? Patient states she gained 2lbs, but she is not swollen anywhere.  States she has been feeling bloated. She had a hip replacement last July, but the past few months she has been having pains.  She has on going MS pain.

## 2022-01-07 NOTE — Telephone Encounter (Signed)
Advised the below BP's are okay.  Advised if systolic BP is 282 all the time, meds would need to be adjusted. Denies, swelling, sob, chest pain or dizziness. Reports losing weight Reports stomach bloating and hip pain Advised to contact PCP for bloating and hip pain Advised to regularly monitor and record your blood pressure readings at home. Please use the same machine at the same time of day to check your readings and record them and call in 2 weeks with bp readings Advised to weigh, daily, at the same time every day, and in the same amount of clothing. Please record your daily weights. Advised if she gains 2-3 lbs/24 hours or 5 lbs in one week, to notify our office Verbalized understanding of plan

## 2022-01-07 NOTE — Patient Outreach (Signed)
Received a Nurse call center notification for Ms. Coppola . I have assigned Raina Mina, RN to call for follow up and determine if there are any Case Management needs.    [facility], [place], [place] Management [phone]

## 2022-01-09 ENCOUNTER — Other Ambulatory Visit: Payer: Self-pay | Admitting: Neurology

## 2022-01-10 ENCOUNTER — Ambulatory Visit (HOSPITAL_BASED_OUTPATIENT_CLINIC_OR_DEPARTMENT_OTHER): Payer: Medicare Other | Admitting: Physical Therapy

## 2022-01-10 ENCOUNTER — Encounter (HOSPITAL_BASED_OUTPATIENT_CLINIC_OR_DEPARTMENT_OTHER): Payer: Self-pay | Admitting: Physical Therapy

## 2022-01-10 DIAGNOSIS — R262 Difficulty in walking, not elsewhere classified: Secondary | ICD-10-CM

## 2022-01-10 DIAGNOSIS — M6281 Muscle weakness (generalized): Secondary | ICD-10-CM

## 2022-01-10 DIAGNOSIS — M542 Cervicalgia: Secondary | ICD-10-CM | POA: Diagnosis not present

## 2022-01-10 DIAGNOSIS — M5459 Other low back pain: Secondary | ICD-10-CM

## 2022-01-10 DIAGNOSIS — M545 Low back pain, unspecified: Secondary | ICD-10-CM | POA: Diagnosis not present

## 2022-01-10 DIAGNOSIS — R252 Cramp and spasm: Secondary | ICD-10-CM

## 2022-01-13 NOTE — Telephone Encounter (Signed)
Patient is up to date on her appointments. Patient is due for a refill on klonopin. [place] Controlled Substance Registry checked and is appropriate.

## 2022-01-16 ENCOUNTER — Encounter: Payer: Medicare Other | Admitting: Gastroenterology

## 2022-01-17 ENCOUNTER — Ambulatory Visit (HOSPITAL_BASED_OUTPATIENT_CLINIC_OR_DEPARTMENT_OTHER): Payer: Medicare Other | Admitting: Physical Therapy

## 2022-01-17 ENCOUNTER — Encounter (HOSPITAL_BASED_OUTPATIENT_CLINIC_OR_DEPARTMENT_OTHER): Payer: Self-pay | Admitting: Physical Therapy

## 2022-01-17 DIAGNOSIS — R252 Cramp and spasm: Secondary | ICD-10-CM

## 2022-01-17 DIAGNOSIS — M6281 Muscle weakness (generalized): Secondary | ICD-10-CM

## 2022-01-17 DIAGNOSIS — R262 Difficulty in walking, not elsewhere classified: Secondary | ICD-10-CM | POA: Diagnosis not present

## 2022-01-17 DIAGNOSIS — M542 Cervicalgia: Secondary | ICD-10-CM | POA: Diagnosis not present

## 2022-01-17 DIAGNOSIS — M5459 Other low back pain: Secondary | ICD-10-CM

## 2022-01-17 DIAGNOSIS — M545 Low back pain, unspecified: Secondary | ICD-10-CM | POA: Diagnosis not present

## 2022-01-17 NOTE — Therapy (Signed)
[facility] [address] [place], [place], [address] Phone: [phone]   Fax:  [phone]  Physical Therapy Treatment  Patient Details  Name: Dawn [name] MRN: [id number] Date of Birth: 1946/02/25 Referring Provider (PT): Dr Arlice Colt   Encounter Date: 01/17/2022   PT End of Session - 01/17/22 1020     Visit Number 35    Number of Visits 34    Date for PT Re-Evaluation 01/24/22    Authorization Type progress note done on visit 29 next to be done on 52    PT Start Time 1010    PT Stop Time 1052    PT Time Calculation (min) 42 min    Activity Tolerance Patient tolerated treatment well    Behavior During Therapy [facility] for tasks assessed/performed             Past Medical History:  Diagnosis Date   Allergy    Dry eyes    Endometrial polyp    Essential hypertension 08/25/2018   GERD (gastroesophageal reflux disease)    Heart failure ([place])    History of hiatal hernia    History of kidney stones    Hot flashes, menopausal 10/13/2011   Estradiol started    Jaundice as teenager   no problems since   Memory loss 09/21/2019     1/2 of feet numb all the time   Multiple sclerosis ([place]) dx 2001   Neuropathy    Neuropathy    bilateral feet   Osteoarthritis    Sjogren's syndrome ([place]) dx oct 2021   sore muscvles, dry mouth and eyes   Stroke ([place])    Vertigo    Vision abnormalities     Past Surgical History:  Procedure Laterality Date   ABDOMINAL HYSTERECTOMY  2002   partial   colonscopy  2011   CORONARY STENT INTERVENTION N/A 07/18/2021   Procedure: CORONARY STENT INTERVENTION;  Surgeon: Jettie Booze, MD;  Location: [facility];  Service: Cardiovascular;  Laterality: N/A;   DILATATION & CURETTAGE/HYSTEROSCOPY WITH MYOSURE N/A 06/08/2020   Procedure: DILATATION & CURETTAGE/HYSTEROSCOPY/Polypectomy WITH MYOSURE;  Surgeon: Armandina Stammer, DO;  Location: [facility];  Service:  Gynecology;  Laterality: N/A;   LEFT HEART CATH AND CORONARY ANGIOGRAPHY N/A 07/18/2021   Procedure: LEFT HEART CATH AND CORONARY ANGIOGRAPHY;  Surgeon: Jettie Booze, MD;  Location: [facility];  Service: Cardiovascular;  Laterality: N/A;   LUMBAR FUSION  2001   TOTAL HIP ARTHROPLASTY Right 01/17/2021   Procedure: TOTAL HIP ARTHROPLASTY ANTERIOR APPROACH;  Surgeon: Rod Can, MD;  Location: [facility];  Service: Orthopedics;  Laterality: Right;   UPPER GI ENDOSCOPY  yrs ago    There were no vitals filed for this visit.   Subjective Assessment - 01/17/22 1011     Subjective Patient continues to make steady progress. She has had a busy week but feels good today.    Pertinent History MS    How long can you stand comfortably? Hurts upon standing    How long can you walk comfortably? Limited community ambulation    Diagnostic tests Nothing recent    Patient Stated Goals to have less pain with ambualtion    Currently in Pain? Yes    Pain Score 4     Pain Location Hip    Pain Orientation Right    Pain Descriptors / Indicators Aching    Pain Type Chronic pain    Pain Onset More  than a month ago    Pain Frequency Intermittent    Aggravating Factors  pain with walkng    Pain Relieving Factors rest    Effect of Pain on Daily Activities difficulty with community ambualtion    Multiple Pain Sites No                   Manual: roller to anterior quad/lateral hip; trigger point assessment. Side lying roller to hip; trigger point release to lumbar spine   LTR x20 Bridge 2x12  SAQ 2x15 lb  Seated clamshell 2x20 green   Supine quad set -  LAQ - 2x10 2lbs        Standing march 2x10 1st set no weight; 2lbs  Heel raises x20   Narrow base eyes open 2x30 sec  Tandem stance 2x20 sec                                          PT Short Term Goals - 11/29/21 1500       PT SHORT TERM GOAL #1   Title Patient will report a 50% reduction in  lower back pain    Baseline no low back pain    Time 4    Period Weeks    Status Achieved      PT SHORT TERM GOAL #2   Title Patient will ambaulte 1200 with no significant spike in B/P or HR    Baseline 900 feet goal adjuested    Time 4    Period Weeks    Status Revised    Target Date 01/24/22      PT SHORT TERM GOAL #3   Title Patient increase right hip flexion and abducion strength by 5 lbs    Baseline more limited since recent onset of pain    Time 4    Period Weeks    Status On-going    Target Date 12/27/21      PT SHORT TERM GOAL #4   Title Patient will be indepdnent with basic endruance and right hip strengthening program    Baseline working on base program    Time 4    Period Weeks    Status On-going    Target Date 12/27/21      PT SHORT TERM GOAL #5   Title Patient will reduce TUG score by 4    Baseline not tested today    Time 4    Period Weeks    Status On-going    Target Date 12/27/21               PT Long Term Goals - 11/29/21 1501       PT LONG TERM GOAL #1   Title Patient will return to [facility] with a full program for her hip/ lower back/ and endurance    Baseline has not been able to 2nd to recent onset of pain    Time 8    Period Weeks    Status On-going    Target Date 01/24/22      PT LONG TERM GOAL #2   Title Patient will ambualte 1200' the 6 min walk test in order to improve community ambualtion    Baseline 900 goal revised    Time 8    Period Weeks    Status Revised    Target Date 01/24/22      PT  LONG TERM GOAL #3   Title Patient will stand for 30 min without a significant increase in pain in order to perfrom ADL's    Baseline still having pain in the right hip    Time 8    Period Weeks    Status On-going    Target Date 01/24/22                   Plan - 01/17/22 1043     Clinical Impression Statement The patient is making good progress. She continues tolerate treatment well. We have been ale to focus more  on exercises and advancing her erights> We will re-assess next visit and review her POC going forward. she will see her MD next week.    Personal Factors and Comorbidities Comorbidity 1;Comorbidity 2    Comorbidities MS, OA    Examination-Activity Limitations Bed Mobility    Examination-Participation Restrictions Meal Prep;Cleaning;Community Activity;Laundry;Shop;Occupation    Stability/Clinical Decision Making Stable/Uncomplicated    Clinical Decision Making Low    Rehab Potential Good    PT Frequency 1x / week    PT Duration 8 weeks    PT Treatment/Interventions ADLs/Self Care Home Management;Electrical Stimulation;Cryotherapy;Iontophoresis '4mg'$ /ml Dexamethasone;Moist Heat;Traction;DME Instruction;Neuromuscular re-education;Patient/family education;Manual techniques;Passive range of motion;Taping;Therapeutic activities;Therapeutic exercise;Balance training;Gait training;Stair training;Functional mobility training;Dry needling;Ultrasound    PT Next Visit Plan add in endurance exercises; review tolerance to interval training    PT Home Exercise Plan reviewed seated hamstring stretch and LTR. She has done those in the past.    Consulted and Agree with Plan of Care Patient             Patient will benefit from skilled therapeutic intervention in order to improve the following deficits and impairments:  Abnormal gait, Difficulty walking, Decreased range of motion, Decreased mobility, Decreased strength, Postural dysfunction, Pain, Increased muscle spasms, Decreased activity tolerance, Increased fascial restricitons  Visit Diagnosis: Difficulty in walking, not elsewhere classified  Cramp and spasm  Other low back pain  Muscle weakness (generalized)     Problem List Patient Active Problem List   Diagnosis Date Noted   Positive colorectal cancer screening using Cologuard test 11/14/2021   Coronary artery disease    Acute diastolic CHF (congestive heart failure) ([place]) 06/15/2021    Demand ischemia ([place]) 06/15/2021   Chest pain 06/14/2021   Elevated troponin 06/14/2021   Statin intolerance 04/19/2021   Osteoarthritis of right hip 01/17/2021   Status post THR (total hip replacement) 01/17/2021   Chronic bilateral low back pain without sciatica 10/02/2020   Chronic pain syndrome 06/20/2020   Post laminectomy syndrome 06/20/2020   Sjogren's disease ([place]) 05/23/2020   Primary osteoarthritis of both hands 05/23/2020   Primary osteoarthritis of both feet 05/23/2020   Sacroiliitis, not elsewhere classified ([place]) 12/22/2019   Other secondary scoliosis, lumbar region 12/22/2019   Spondylosis without myelopathy or radiculopathy, lumbar region 12/22/2019   Cervical radiculopathy 10/18/2019   Numbness 09/21/2019   Bilateral carpal tunnel syndrome 09/21/2019   High risk medication use 09/21/2019   Memory loss 09/21/2019   Essential hypertension 08/25/2018   Abnormal SPEP 02/19/2018   Hand pain 02/20/2017   Multiple joint pain 02/18/2017   Right elbow pain 12/09/2016   Disturbed cognition 06/25/2016   Trochanteric bursitis of left hip 02/18/2016   Sciatica, right side 10/30/2015   Bilateral arm pain 10/10/2015   Trochanteric bursitis of both hips 08/04/2014   Chronic fatigue 08/04/2014   Urinary frequency 08/04/2014   Abnormality of gait 08/04/2014   Unspecified  visual disturbance 01/31/2013   Transient vision disturbance 01/31/2013   Routine general medical examination at a health care facility 11/04/2011   Colon polyps 11/03/2011   Hot flashes, menopausal 10/13/2011   POSTHERPETIC NEURALGIA 10/03/2009   DISPLCMT LUMBAR INTERVERT DISC W/O MYELOPATHY 09/05/2009   RENAL CALCULUS, RECURRENT 09/04/2009   Hyperlipidemia 08/31/2009   Multiple sclerosis ([place]) 08/31/2009   ALLERGIC RHINITIS 08/31/2009    Carney Living, PT 01/17/2022, 10:57 AM  Jolene Schimke SPT  [id number]  [facility] [address] [place],  [place], [address] Phone: [phone]   Fax:  [phone]  Name: Dawn [name] MRN: [id number] Date of Birth: 1946/05/17

## 2022-01-24 DIAGNOSIS — H0014 Chalazion left upper eyelid: Secondary | ICD-10-CM | POA: Diagnosis not present

## 2022-01-27 ENCOUNTER — Ambulatory Visit (AMBULATORY_SURGERY_CENTER): Payer: Medicare Other | Admitting: Gastroenterology

## 2022-01-27 ENCOUNTER — Encounter: Payer: Self-pay | Admitting: Gastroenterology

## 2022-01-27 VITALS — BP 162/61 | HR 60 | Temp 96.4°F | Resp 14 | Ht 62.0 in | Wt 168.0 lb

## 2022-01-27 DIAGNOSIS — D122 Benign neoplasm of ascending colon: Secondary | ICD-10-CM | POA: Diagnosis not present

## 2022-01-27 DIAGNOSIS — D123 Benign neoplasm of transverse colon: Secondary | ICD-10-CM | POA: Diagnosis not present

## 2022-01-27 DIAGNOSIS — Z09 Encounter for follow-up examination after completed treatment for conditions other than malignant neoplasm: Secondary | ICD-10-CM | POA: Diagnosis not present

## 2022-01-27 DIAGNOSIS — Z1211 Encounter for screening for malignant neoplasm of colon: Secondary | ICD-10-CM | POA: Diagnosis not present

## 2022-01-27 DIAGNOSIS — Z86010 Personal history of colonic polyps: Secondary | ICD-10-CM | POA: Diagnosis not present

## 2022-01-27 DIAGNOSIS — R195 Other fecal abnormalities: Secondary | ICD-10-CM | POA: Diagnosis not present

## 2022-01-27 HISTORY — PX: COLONOSCOPY: SHX174

## 2022-01-27 MED ORDER — SODIUM CHLORIDE 0.9 % IV SOLN: 500.0000 mL | Freq: Once | INTRAVENOUS | Status: DC

## 2022-01-27 NOTE — Progress Notes (Signed)
Medical record updated 

## 2022-01-27 NOTE — Progress Notes (Signed)
Patient called at home today after procedure at 12:00 pm. Message left on patient's phone for patient to resume taking Plavix on Tuesday, 01/28/2022. New report from Dr. Tarri Glenn mailed to patient today, B.Donnelle Rubey RN.

## 2022-01-27 NOTE — Progress Notes (Signed)
Referring Provider: Sandrea Hughs, NP Primary Care Physician:  Sandrea Hughs, NP  Indication for Procedure:  Positive Cologuard   IMPRESSION:  Positive Cologuard History of colon polyp on last colonoscopy in 2008 Appropriate candidate for monitored anesthesia care  PLAN: Colonoscopy in the [place] today   HPI: Dawn [name] is a 76 y.o. female presents for colonoscopy to evaluate a positive Cologuard.  No symptoms associated with the + cologuard.  No overt GI blood loss. No melena, hematochezia, bright red blood per rectum. No epistaxis, vaginal bleeding, hemoptysis, or hematuria.     Records make reference an adenomatous polyp on colonoscopy in 2008.  However, I am unable to find the procedure note or the path results.   There is no known family history of colon cancer or polyps. No family history of stomach cancer or other GI malignancy. No family history of inflammatory bowel disease or celiac.    Past Medical History:  Diagnosis Date   Allergy    Dry eyes    Endometrial polyp    Essential hypertension 08/25/2018   GERD (gastroesophageal reflux disease)    Heart failure (HCC)    History of hiatal hernia    History of kidney stones    Hot flashes, menopausal 10/13/2011   Estradiol started    Jaundice as teenager   no problems since   Memory loss 09/21/2019     1/2 of feet numb all the time   Multiple sclerosis ([place]) dx 2001   Neuropathy    Neuropathy    bilateral feet   Osteoarthritis    Sjogren's syndrome ([place]) dx oct 2021   sore muscvles, dry mouth and eyes   Stroke [facility])    Vertigo    Vision abnormalities     Past Surgical History:  Procedure Laterality Date   ABDOMINAL HYSTERECTOMY  2002   partial   COLONOSCOPY  01/27/2022   2 day prep   colonscopy  2011   CORONARY STENT INTERVENTION N/A 07/18/2021   Procedure: CORONARY STENT INTERVENTION;  Surgeon: Jettie Booze, MD;  Location: [facility];  Service: Cardiovascular;   Laterality: N/A;   DILATATION & CURETTAGE/HYSTEROSCOPY WITH MYOSURE N/A 06/08/2020   Procedure: DILATATION & CURETTAGE/HYSTEROSCOPY/Polypectomy WITH MYOSURE;  Surgeon: Armandina Stammer, DO;  Location: [place];  Service: Gynecology;  Laterality: N/A;   LEFT HEART CATH AND CORONARY ANGIOGRAPHY N/A 07/18/2021   Procedure: LEFT HEART CATH AND CORONARY ANGIOGRAPHY;  Surgeon: Jettie Booze, MD;  Location: [facility];  Service: Cardiovascular;  Laterality: N/A;   LUMBAR FUSION  2001   TOTAL HIP ARTHROPLASTY Right 01/17/2021   Procedure: TOTAL HIP ARTHROPLASTY ANTERIOR APPROACH;  Surgeon: Rod Can, MD;  Location: [facility];  Service: Orthopedics;  Laterality: Right;   UPPER GI ENDOSCOPY  yrs ago    Current Outpatient Medications  Medication Sig Dispense Refill   aspirin EC 81 MG tablet Take 81 mg by mouth daily. Swallow whole.     carvedilol (COREG) 6.25 MG tablet Take 1 tablet (6.25 mg total) by mouth 2 (two) times daily with a meal. 180 tablet 3   cholecalciferol (VITAMIN D) 25 MCG (1000 UNIT) tablet Take 2,000 Units by mouth daily.     clonazePAM (KLONOPIN) 0.5 MG tablet TAKE 1 TABLET(0.5 MG) BY MOUTH AT BEDTIME 90 tablet 1   fexofenadine (ALLEGRA) 180 MG tablet Take 180 mg by mouth at bedtime.      hydrALAZINE (APRESOLINE) 10 MG tablet TAKE 1  TABLET BY MOUTH EVERY 8 HOURS. 120 tablet 3   lamoTRIgine (LAMICTAL) 25 MG tablet Take 50 mg by mouth 2 (two) times daily.     leflunomide (ARAVA) 20 MG tablet TAKE 1 TABLET(20 MG) BY MOUTH DAILY 90 tablet 3   Lidocaine 4 % PTCH Place 1 patch onto the skin daily as needed (mild pain). Remove & Discard patch within 12 hours or as directed by MD     losartan (COZAAR) 100 MG tablet TAKE 1 TABLET(100 MG) BY MOUTH DAILY 90 tablet 2   melatonin 5 MG TABS Take 5 mg by mouth at bedtime as needed.     modafinil (PROVIGIL) 200 MG tablet Take 1 tablet (200 mg total) by mouth in the morning. 90 tablet 0   pantoprazole (PROTONIX) 40  MG tablet Take 1 tablet (40 mg total) by mouth daily. 30 tablet 11   rosuvastatin (CRESTOR) 10 MG tablet TAKE 1 TABLET BY MOUTH DAILY 90 tablet 3   clopidogrel (PLAVIX) 75 MG tablet Take 75 mg by mouth daily.     Flaxseed, Linseed, (FLAXSEED OIL PO) Take 5-10 mLs by mouth 3 (three) times a week. Power     nitroGLYCERIN (NITROSTAT) 0.4 MG SL tablet Place 1 tablet (0.4 mg total) under the tongue every 5 (five) minutes as needed for chest pain. 30 tablet 0   solifenacin (VESICARE) 5 MG tablet TAKE 1 TABLET(5 MG) BY MOUTH DAILY 90 tablet 3   traMADol (ULTRAM) 50 MG tablet Take 50 mg by mouth every 6 (six) hours as needed for moderate pain.     Current Facility-Administered Medications  Medication Dose Route Frequency Provider Last Rate Last Admin   0.9 %  sodium chloride infusion  500 mL Intravenous Once Thornton Park, MD        Allergies as of 01/27/2022 - Review Complete 01/17/2022  Allergen Reaction Noted   Statins Other (See Comments) 05/14/2021    Family History  Problem Relation Age of Onset   Heart failure Mother    Heart failure Father    Arthritis Other    Hypertension Other    Colon cancer Neg Hx    Colon polyps Neg Hx    Esophageal cancer Neg Hx    Stomach cancer Neg Hx    Rectal cancer Neg Hx      Physical Exam: General:   Alert,  well-nourished, pleasant and cooperative in NAD Head:  Normocephalic and atraumatic. Eyes:  Sclera clear, no icterus.   Conjunctiva pink. Mouth:  No deformity or lesions.   Neck:  Supple; no masses or thyromegaly. Lungs:  Clear throughout to auscultation.   No wheezes. Heart:  Regular rate and rhythm; no murmurs. Abdomen:  Soft, non-tender, nondistended, normal bowel sounds, no rebound or guarding.  Msk:  Symmetrical. No boney deformities LAD: No inguinal or umbilical LAD Extremities:  No clubbing or edema. Neurologic:  Alert and  oriented x4;  grossly nonfocal Skin:  No obvious rash or bruise. Psych:  Alert and cooperative. Normal  mood and affect.     Studies/Results: No results found.    Dawn Shouse L. Tarri Glenn, MD, MPH 01/27/2022, 9:32 AM

## 2022-01-27 NOTE — Progress Notes (Signed)
To PACU, VSS. Report to [email or web address].tb 

## 2022-01-27 NOTE — Op Note (Addendum)
[facility] Patient Name: Dawn [name] Procedure Date: 01/27/2022 9:37 AM MRN: [id number] Endoscopist: Thornton Park MD, MD Age: 76 Referring MD:  Date of Birth: 07-Jul-1946 Gender: Female Account #: [id number] Procedure:                Colonoscopy Indications:              Positive Cologuard test                           Adenoma on colonoscopy 2008 based on review of                            pathology results (procedure note not available)                           No known family history of colon cancer or polyps Medicines:                Monitored Anesthesia Care Procedure:                Pre-Anesthesia Assessment:                           - Prior to the procedure, a History and Physical                            was performed, and patient medications and                            allergies were reviewed. The patient's tolerance of                            previous anesthesia was also reviewed. The risks                            and benefits of the procedure and the sedation                            options and risks were discussed with the patient.                            All questions were answered, and informed consent                            was obtained. Prior Anticoagulants: The patient has                            taken Plavix (clopidogrel), last dose was 5 days                            prior to procedure. ASA Grade Assessment: II - A                            patient with mild systemic disease. After reviewing  the risks and benefits, the patient was deemed in                            satisfactory condition to undergo the procedure.                           After obtaining informed consent, the colonoscope                            was passed under direct vision. Throughout the                            procedure, the patient's blood pressure, pulse, and                            oxygen saturations were  monitored continuously. The                            PCF-HQ190L Colonoscope was introduced through the                            anus and advanced to the 3 cm into the ileum. A                            second forward view of the right colon was                            performed. The colonoscopy was performed without                            difficulty. The patient tolerated the procedure                            well. The quality of the bowel preparation was                            good. The terminal ileum, ileocecal valve,                            appendiceal orifice, and rectum were photographed. Scope In: 9:42:08 AM Scope Out: 9:56:24 AM Scope Withdrawal Time: 0 hours 11 minutes 10 seconds  Total Procedure Duration: 0 hours 14 minutes 16 seconds  Findings:                 Non-bleeding external and internal hemorrhoids were                            found. The hemorrhoids were small.                           A 2 mm polyp was found in the transverse colon. The                            polyp was flat. The  polyp was removed with a cold                            snare. Resection and retrieval were complete.                            Estimated blood loss was minimal.                           A 3 mm polyp was found in the ascending colon. The                            polyp was sessile. The polyp was removed with a                            cold snare. Resection and retrieval were complete.                            Estimated blood loss was minimal.                           The exam was otherwise without abnormality on                            direct and retroflexion views. Complications:            No immediate complications. Estimated Blood Loss:     Estimated blood loss was minimal. Impression:               - Non-bleeding external and internal hemorrhoids.                           - One 2 mm polyp in the transverse colon, removed                             with a cold snare. Resected and retrieved.                           - One 3 mm polyp in the ascending colon, removed                            with a cold snare. Resected and retrieved.                           - The examination was otherwise normal on direct                            and retroflexion views. Recommendation:           - Patient has a contact number available for                            emergencies. The signs and symptoms of potential  delayed complications were discussed with the                            patient. Return to normal activities tomorrow.                            Written discharge instructions were provided to the                            patient.                           - Resume previous diet.                           - Continue present medications.                           - Resume Plavix 01/28/22.                           - Await pathology results.                           - Repeat colonoscopy is not recommended for colon                            cancer surveillance given age.                           - Emerging evidence supports eating a diet of                            fruits, vegetables, grains, calcium, and yogurt                            while reducing red meat and alcohol may reduce the                            risk of colon cancer.                           - Thank you for allowing me to be involved in your                            colon cancer prevention. Thornton Park MD, MD 01/27/2022 10:02:36 AM This report has been signed electronically.

## 2022-01-27 NOTE — Progress Notes (Signed)
Called to room to assist during endoscopic procedure.  Patient ID and intended procedure confirmed with present staff. Received instructions for my participation in the procedure from the performing physician.  

## 2022-01-27 NOTE — Patient Instructions (Signed)
Please read handouts provided. Continue present medications. Await pathology results.   YOU HAD AN ENDOSCOPIC PROCEDURE TODAY AT THE  ENDOSCOPY CENTER:   Refer to the procedure report that was given to you for any specific questions about what was found during the examination.  If the procedure report does not answer your questions, please call your gastroenterologist to clarify.  If you requested that your care partner not be given the details of your procedure findings, then the procedure report has been included in a sealed envelope for you to review at your convenience later.  YOU SHOULD EXPECT: Some feelings of bloating in the abdomen. Passage of more gas than usual.  Walking can help get rid of the air that was put into your GI tract during the procedure and reduce the bloating. If you had a lower endoscopy (such as a colonoscopy or flexible sigmoidoscopy) you may notice spotting of blood in your stool or on the toilet paper. If you underwent a bowel prep for your procedure, you may not have a normal bowel movement for a few days.  Please Note:  You might notice some irritation and congestion in your nose or some drainage.  This is from the oxygen used during your procedure.  There is no need for concern and it should clear up in a day or so.  SYMPTOMS TO REPORT IMMEDIATELY:  Following lower endoscopy (colonoscopy or flexible sigmoidoscopy):  Excessive amounts of blood in the stool  Significant tenderness or worsening of abdominal pains  Swelling of the abdomen that is new, acute  Fever of 100F or higher  For urgent or emergent issues, a gastroenterologist can be reached at any hour by calling [phone]. Do not use MyChart messaging for urgent concerns.    DIET:  We do recommend a small meal at first, but then you may proceed to your regular diet.  Drink plenty of fluids but you should avoid alcoholic beverages for 24 hours.  ACTIVITY:  You should plan to take it easy for  the rest of today and you should NOT DRIVE or use heavy machinery until tomorrow (because of the sedation medicines used during the test).    FOLLOW UP: Our staff will call the number listed on your records the next business day following your procedure.  We will call around 7:15- 8:00 am to check on you and address any questions or concerns that you may have regarding the information given to you following your procedure. If we do not reach you, we will leave a message.  If you develop any symptoms (ie: fever, flu-like symptoms, shortness of breath, cough etc.) before then, please call [phone].  If you test positive for Covid 19 in the 2 weeks post procedure, please call and report this information to us.    If any biopsies were taken you will be contacted by phone or by letter within the next 1-3 weeks.  Please call us at [phone] if you have not heard about the biopsies in 3 weeks.    SIGNATURES/CONFIDENTIALITY: You and/or your care partner have signed paperwork which will be entered into your electronic medical record.  These signatures attest to the fact that that the information above on your After Visit Summary has been reviewed and is understood.  Full responsibility of the confidentiality of this discharge information lies with you and/or your care-partner.  

## 2022-01-30 ENCOUNTER — Other Ambulatory Visit: Payer: Self-pay | Admitting: *Deleted

## 2022-01-30 NOTE — Patient Outreach (Signed)
  Care Coordination   Follow Up Visit Note   01/30/2022 Name: LOUCILLE TAKACH MRN: [id number] DOB: September 01, 1945  [name] is a 76 y.o. year old female who sees [name], [place], NP for primary care. I  was unsuccessful with this outreach.  What matters to the patients health and wellness today?  none   Goals Addressed   None     SDOH assessments and interventions completed:   N/A   Care Coordination Interventions Activated:  No Care Coordination Interventions:   No, not indicated  Follow up plan:  Will reschedule over the next week  Encounter Outcome:  No Answer  Raina Mina, RN Care Management Coordinator [place] Office [phone]

## 2022-01-31 ENCOUNTER — Ambulatory Visit (HOSPITAL_BASED_OUTPATIENT_CLINIC_OR_DEPARTMENT_OTHER): Payer: Medicare Other | Attending: Neurology | Admitting: Physical Therapy

## 2022-01-31 DIAGNOSIS — M5459 Other low back pain: Secondary | ICD-10-CM | POA: Diagnosis not present

## 2022-01-31 DIAGNOSIS — R262 Difficulty in walking, not elsewhere classified: Secondary | ICD-10-CM | POA: Insufficient documentation

## 2022-01-31 DIAGNOSIS — M545 Low back pain, unspecified: Secondary | ICD-10-CM | POA: Insufficient documentation

## 2022-01-31 DIAGNOSIS — R252 Cramp and spasm: Secondary | ICD-10-CM | POA: Diagnosis not present

## 2022-01-31 DIAGNOSIS — G8929 Other chronic pain: Secondary | ICD-10-CM | POA: Diagnosis not present

## 2022-01-31 DIAGNOSIS — M6281 Muscle weakness (generalized): Secondary | ICD-10-CM | POA: Diagnosis not present

## 2022-01-31 DIAGNOSIS — M542 Cervicalgia: Secondary | ICD-10-CM | POA: Diagnosis not present

## 2022-01-31 NOTE — Therapy (Signed)
[facility] [address] [place], [place], [address] Phone: [phone]   Fax:  [phone]  Physical Therapy Treatment  Patient Details  Name: Dawn [name] MRN: [id number] Date of Birth: 01/02/1946 Referring Provider (PT): Dr Arlice Colt   Encounter Date: 01/31/2022    Past Medical History:  Diagnosis Date   Allergy    Dry eyes    Endometrial polyp    Essential hypertension 08/25/2018   GERD (gastroesophageal reflux disease)    Heart failure ([place])    History of hiatal hernia    History of kidney stones    Hot flashes, menopausal 10/13/2011   Estradiol started    Jaundice as teenager   no problems since   Memory loss 09/21/2019     1/2 of feet numb all the time   Multiple sclerosis ([place]) dx 2001   Neuropathy    Neuropathy    bilateral feet   Osteoarthritis    Sjogren's syndrome ([place]) dx oct 2021   sore muscvles, dry mouth and eyes   Stroke [facility])    Vertigo    Vision abnormalities     Past Surgical History:  Procedure Laterality Date   ABDOMINAL HYSTERECTOMY  2002   partial   COLONOSCOPY  01/27/2022   2 day prep   colonscopy  2011   CORONARY STENT INTERVENTION N/A 07/18/2021   Procedure: CORONARY STENT INTERVENTION;  Surgeon: Jettie Booze, MD;  Location: [facility];  Service: Cardiovascular;  Laterality: N/A;   DILATATION & CURETTAGE/HYSTEROSCOPY WITH MYOSURE N/A 06/08/2020   Procedure: DILATATION & CURETTAGE/HYSTEROSCOPY/Polypectomy WITH MYOSURE;  Surgeon: Armandina Stammer, DO;  Location: [facility];  Service: Gynecology;  Laterality: N/A;   LEFT HEART CATH AND CORONARY ANGIOGRAPHY N/A 07/18/2021   Procedure: LEFT HEART CATH AND CORONARY ANGIOGRAPHY;  Surgeon: Jettie Booze, MD;  Location: [facility];  Service: Cardiovascular;  Laterality: N/A;   LUMBAR FUSION  2001   TOTAL HIP ARTHROPLASTY Right 01/17/2021   Procedure: TOTAL HIP ARTHROPLASTY ANTERIOR  APPROACH;  Surgeon: Rod Can, MD;  [facility];  Service: Orthopedics;  Laterality: Right;   UPPER GI ENDOSCOPY  yrs ago    There were no vitals filed for this visit.                        Right Hip Flexion --   9.4  3/20 12.2  5/12 10.1    Right Hip ABduction --   13.8  3/20 16.9  5/12  15.6    Left Hip ABduction --   24.7 3/20 33.5 5/12 16.5    Left hip flexion --   17.6 3/20 20.8  5/12  14.1    Right Knee Extension --   14.8 3/20 24.7 5/12 20.3    Left Knee Extension --   28.4 3/20 28.0 5/12 25.3          MMT Right 7/14date Left 7/14date  Hip flexion 12 18.4  Hip extension    Hip abduction 19.8 23.1  Hip adduction    Hip internal rotation    Hip external rotation    Knee flexion    Knee extension 14.6 26.9  Ankle dorsiflexion    Ankle plantarflexion    Ankle inversion    Ankle eversion     (Blank rows = not tested)  PT Short Term Goals - 11/29/21 1500       PT SHORT TERM GOAL #1   Title Patient will report a 50% reduction in lower back pain    Baseline no low back pain    Time 4    Period Weeks    Status Achieved      PT SHORT TERM GOAL #2   Title Patient will ambaulte 1200 with no significant spike in B/P or HR    Baseline 900 feet goal adjuested    Time 4    Period Weeks    Status Revised    Target Date 01/24/22      PT SHORT TERM GOAL #3   Title Patient increase right hip flexion and abducion strength by 5 lbs    Baseline more limited since recent onset of pain    Time 4    Period Weeks    Status On-going    Target Date 12/27/21      PT SHORT TERM GOAL #4   Title Patient will be indepdnent with basic endruance and right hip strengthening program    Baseline working on base program    Time 4    Period Weeks    Status On-going    Target Date 12/27/21      PT SHORT TERM GOAL #5   Title Patient will reduce TUG score by 4    Baseline not tested today    Time 4    Period Weeks     Status On-going    Target Date 12/27/21               PT Long Term Goals - 11/29/21 1501       PT LONG TERM GOAL #1   Title Patient will return to [facility] with a full program for her hip/ lower back/ and endurance    Baseline has not been able to 2nd to recent onset of pain    Time 8    Period Weeks    Status On-going    Target Date 01/24/22      PT LONG TERM GOAL #2   Title Patient will ambualte 1200' the 6 min walk test in order to improve community ambualtion    Baseline 900 goal revised    Time 8    Period Weeks    Status Revised    Target Date 01/24/22      PT LONG TERM GOAL #3   Title Patient will stand for 30 min without a significant increase in pain in order to perfrom ADL's    Baseline still having pain in the right hip    Time 8    Period Weeks    Status On-going    Target Date 01/24/22                    Patient will benefit from skilled therapeutic intervention in order to improve the following deficits and impairments:     Visit Diagnosis: No diagnosis found.     Problem List Patient Active Problem List   Diagnosis Date Noted   Positive colorectal cancer screening using Cologuard test 11/14/2021   Coronary artery disease    Acute diastolic CHF (congestive heart failure) ([place]) 06/15/2021   Demand ischemia ([place]) 06/15/2021   Chest pain 06/14/2021   Elevated troponin 06/14/2021   Statin intolerance 04/19/2021   Osteoarthritis of right hip 01/17/2021   Status post THR (total hip replacement) 01/17/2021   Chronic bilateral low  back pain without sciatica 10/02/2020   Chronic pain syndrome 06/20/2020   Post laminectomy syndrome 06/20/2020   Sjogren's disease ([place]) 05/23/2020   Primary osteoarthritis of both hands 05/23/2020   Primary osteoarthritis of both feet 05/23/2020   Sacroiliitis, not elsewhere classified ([place]) 12/22/2019   Other secondary scoliosis, lumbar region 12/22/2019   Spondylosis without myelopathy or  radiculopathy, lumbar region 12/22/2019   Cervical radiculopathy 10/18/2019   Numbness 09/21/2019   Bilateral carpal tunnel syndrome 09/21/2019   High risk medication use 09/21/2019   Memory loss 09/21/2019   Essential hypertension 08/25/2018   Abnormal SPEP 02/19/2018   Hand pain 02/20/2017   Multiple joint pain 02/18/2017   Right elbow pain 12/09/2016   Disturbed cognition 06/25/2016   Trochanteric bursitis of left hip 02/18/2016   Sciatica, right side 10/30/2015   Bilateral arm pain 10/10/2015   Trochanteric bursitis of both hips 08/04/2014   Chronic fatigue 08/04/2014   Urinary frequency 08/04/2014   Abnormality of gait 08/04/2014   Unspecified visual disturbance 01/31/2013   Transient vision disturbance 01/31/2013   Routine general medical examination at a health care facility 11/04/2011   Colon polyps 11/03/2011   Hot flashes, menopausal 10/13/2011   POSTHERPETIC NEURALGIA 10/03/2009   DISPLCMT LUMBAR INTERVERT DISC W/O MYELOPATHY 09/05/2009   RENAL CALCULUS, RECURRENT 09/04/2009   Hyperlipidemia 08/31/2009   Multiple sclerosis ([place]) 08/31/2009   ALLERGIC RHINITIS 08/31/2009    Carney Living, PT 01/31/2022, 11:08 AM  [place], [place], [address] Phone: [phone]   Fax:  [phone]  Name: Dawn [name] MRN: [id number] Date of Birth: [birth year]

## 2022-02-02 ENCOUNTER — Encounter: Payer: Self-pay | Admitting: Gastroenterology

## 2022-02-02 ENCOUNTER — Encounter (HOSPITAL_BASED_OUTPATIENT_CLINIC_OR_DEPARTMENT_OTHER): Payer: Self-pay | Admitting: Physical Therapy

## 2022-02-02 NOTE — Addendum Note (Signed)
Addended by: Carney Living on: 02/02/2022 08:53 PM   Modules accepted: Orders

## 2022-02-03 ENCOUNTER — Encounter: Payer: Self-pay | Admitting: *Deleted

## 2022-02-03 ENCOUNTER — Ambulatory Visit: Payer: Self-pay | Admitting: *Deleted

## 2022-02-03 NOTE — Patient Outreach (Signed)
  Care Coordination   Follow Up Visit Note   02/03/2022 Name: Dawn [name] MRN: [id number] DOB: [birth year]  Dawn [name] is a 76 y.o. year old female who sees [name], [place], NP for primary care. I spoke with  Dawn [name] by phone today  What matters to the patients health and wellness today?  Improved pain management   Goals Addressed               This Visit's Progress     Improve pain management (pt-stated)        Care Coordination Interventions: Advised patient to discuss with her upcoming providers possible change in her pain management regimen that can assist with her MS and ongoing hip discomfort when encountered. Reviewed medications with patient and discussed Verified pt is no longer taking Vesicare due to issues with urine flow. Reviewed scheduled/upcoming provider appointments including 7/18 surgeon, 7/19, 7/21, 7/28, 9/12, 9/29 and 10/3. Pt has sufficient transport to all appointments.          Patient Stated          SDOH assessments and interventions completed:   Yes SDOH Interventions Today    Flowsheet Row Most Recent Value  SDOH Interventions   Housing Interventions Intervention Not Indicated       Care Coordination Interventions Activated:  Yes Care Coordination Interventions:   Yes, provided  Follow up plan: No further intervention required.  Encounter Outcome:  Pt. Visit Completed  Note: Encouraged pt to communicate with her providers on options for a pain management regimen as a PRN usage due to the occurrence with her pain related to her ongoing MS and recent hip replacement. Pt also involved with PT and has tramadol but does not utilize this medication in fear of addition and takes Tylenol OTC instead. Again RN encouraged pt to discussed other options with her provider on her upcoming appointments.  Pt does not feel ongoing case management services needs to continue at this time and aware to follow up with her provider for any  further needs.   Raina Mina, RN Care Management Coordinator [place] [facility] [phone]

## 2022-02-04 DIAGNOSIS — M7061 Trochanteric bursitis, right hip: Secondary | ICD-10-CM | POA: Diagnosis not present

## 2022-02-04 DIAGNOSIS — M5416 Radiculopathy, lumbar region: Secondary | ICD-10-CM | POA: Diagnosis not present

## 2022-02-05 ENCOUNTER — Ambulatory Visit (INDEPENDENT_AMBULATORY_CARE_PROVIDER_SITE_OTHER): Payer: Medicare Other | Admitting: Neurology

## 2022-02-05 ENCOUNTER — Encounter: Payer: Self-pay | Admitting: Neurology

## 2022-02-05 VITALS — BP 132/63 | HR 62 | Ht 62.0 in | Wt 166.4 lb

## 2022-02-05 DIAGNOSIS — M7062 Trochanteric bursitis, left hip: Secondary | ICD-10-CM

## 2022-02-05 DIAGNOSIS — R5382 Chronic fatigue, unspecified: Secondary | ICD-10-CM | POA: Diagnosis not present

## 2022-02-05 DIAGNOSIS — M2550 Pain in unspecified joint: Secondary | ICD-10-CM

## 2022-02-05 DIAGNOSIS — I2510 Atherosclerotic heart disease of native coronary artery without angina pectoris: Secondary | ICD-10-CM | POA: Diagnosis not present

## 2022-02-05 DIAGNOSIS — M7061 Trochanteric bursitis, right hip: Secondary | ICD-10-CM

## 2022-02-05 DIAGNOSIS — Z79899 Other long term (current) drug therapy: Secondary | ICD-10-CM | POA: Diagnosis not present

## 2022-02-05 DIAGNOSIS — E559 Vitamin D deficiency, unspecified: Secondary | ICD-10-CM

## 2022-02-05 DIAGNOSIS — R269 Unspecified abnormalities of gait and mobility: Secondary | ICD-10-CM

## 2022-02-05 DIAGNOSIS — G35 Multiple sclerosis: Secondary | ICD-10-CM

## 2022-02-05 MED ORDER — METHYLPREDNISOLONE 4 MG PO TABS: ORAL_TABLET | ORAL | 0 refills | Status: DC

## 2022-02-05 NOTE — Patient Instructions (Signed)
Right hip pain could be coming from spine:  Possible cause of pain: Severe right L5S1 facet hypertrophy (most likely cause as no pain in lower leg) 2.   L5 nerve root compression  Consider seeing Interventional pain management for right medial branch blocks and possible ablation

## 2022-02-05 NOTE — Progress Notes (Signed)
[facility]  PATIENT: Dawn [name] DOB: 1946-04-11  REFERRING CLINICIAN: Aura Dials HISTORY FROM: Patient  REASON FOR VISIT: MS   HISTORICAL  CHIEF COMPLAINT:  Chief Complaint  Patient presents with   Follow-up    Rm 16, alone. Here for 6 month MS f/u, on Leflunomide and tolerating well. Pt had hip replacement last year and currently having in increase in pn.LB pn continues and fatigue.     HISTORY OF PRESENT ILLNESS:  Dawn [name] is a 76 y.o. woman who was diagnosed with multiple sclerosis in 2001.      Update 02/05/2022: She had her right hip replacement and has had more pain.  She has done PT.   She saw Ortho yesterday and was told the THR looked good and no inflammation.    She was told that her back was likely.   I feel L5S1 facet hypertrophy is playing a role.  A MBB helped and she may have RFA.     PT with needling has helped some.   Due to reduced ability to stand due to pain, she stopped [place] and s wimming at the [facility].   Even walking a 100 feet in a parking lot becomes very painful.    She notes pain is increased by extending her back and decreased by flexion (can walk better with shopping cart).     Her MS is stable without exacerbations.  She is on Leflunomide [facility] had not covered [place] well for her in the past.).  She tolerates it well but lymphocytes have been low the past year (has been on x several years though so may not be related).    She had right THR last year for a lbral tear and had some heart issues - CHF.  She had a stent placed.  The procedure went well.    She is on Brilinta now.     She has Sjogren's (sees Dr. Estanislado Pandy) and is on leflunomide (for MS but can help SS).  he has a dry mouth.  Tramadol had helped her pain in the past but she rarely takes as it makes her sleepy.       She has LBP.    Her gait is reduced.  She feels balance is poor and also has pain which affects gait.   She feels lightheaded.    Many med's ere changed by cardiology.     She uses a cane due to the right leg giving out at times.   She is noting numbness but less pain in her hands.  At night, she gets painful tingling in her legs.    \ She is sleeping poorly with tingling and cramps in legs and also have hot flashes.   Clonazepam helps insomnia someand spasticity at night as much as it used to.     She feels her cognition is about the same as last visit.      She has had more memory difficulties and is forgetful.     She denies anxiety or depression at this time.   She has fatigue and takes 1 Provigil every morning and sometimes another 1/2 pill in the afternoon.   She generally eats well with a Mediterranean style diet.       _______________________________________________ MS History:    In 1994, she had an episode of severe vertigo with gait ataxia. An MRI at that time was reportedly normal. The symptoms were felt to be due to allergies. About 7  years later she had vertigo, gait ataxia and diplopia. Also her left leg was giving out. Short after that she had a hysterectomy. Postoperatively, she was numb in both legs and she had a lumbar puncture which showed changes consistent with MS. Then, an MRI of the brain was performed showing changes consistent with MS. She was referred to Dr. Erling Cruz who her on Betaseron. Last year, she switched from Betaseron to Tecfidera, in the hope that she would be able to avoid shots as she was having severe skin reactions with knots.    However, she had a lot of difficulty tolerating Tecfidera and swithced to [place] in early 2016 and to [place] late 2016.  Switched to Leflunomide for insurance reasons in 2019.    MRI brain 09/13/2018 shows multiple T2/flair hyperintense foci in the hemispheres.  The pattern is consistent with chronic demyelinating plaque associated with multiple sclerosis though some foci likely also represent chronic microvascular ischemic change.  None of the foci enhance or appear  to be acute and there is no change compared to the 2017 MRI.      Remote left cerebellar infarction.      There is a normal enhancement pattern and no acute findings.  REVIEW OF SYSTEMS:  Constitutional: No fevers, chills, sweats, or change in appetite.   Reports fatigue Eyes: No visual changes, double vision, eye pain Ear, nose and throat: No hearing loss, ear pain, nasal congestion, sore throat Cardiovascular: No chest pain, palpitations Respiratory:  No shortness of breath at rest or with exertion.   No wheezes GastrointestinaI: No nausea, vomiting, diarrhea, abdominal pain, fecal incontinence Genitourinary:  see above. Musculoskeletal:  Pain in left greater than right hip Integumentary: No rash, pruritus.    Neurological: as above Psychiatric: No depression at this time.  No anxiety Endocrine: No palpitations, diaphoresis, change in appetite, change in weigh or increased thirst Hematologic/Lymphatic:  No anemia, purpura, petechiae. Allergic/Immunologic: No itchy/runny eyes, nasal congestion, recent allergic reactions, rashes  ALLERGIES: Allergies  Allergen Reactions   Statins Other (See Comments)    Muscle pain    HOME MEDICATIONS: Outpatient Medications Prior to Visit  Medication Sig Dispense Refill   aspirin EC 81 MG tablet Take 81 mg by mouth daily. Swallow whole.     carvedilol (COREG) 6.25 MG tablet Take 1 tablet (6.25 mg total) by mouth 2 (two) times daily with a meal. 180 tablet 3   cholecalciferol (VITAMIN D) 25 MCG (1000 UNIT) tablet Take 2,000 Units by mouth daily.     clonazePAM (KLONOPIN) 0.5 MG tablet TAKE 1 TABLET(0.5 MG) BY MOUTH AT BEDTIME 90 tablet 1   clopidogrel (PLAVIX) 75 MG tablet Take 75 mg by mouth daily.     fexofenadine (ALLEGRA) 180 MG tablet Take 180 mg by mouth at bedtime.      Flaxseed, Linseed, (FLAXSEED OIL PO) Take 5-10 mLs by mouth 3 (three) times a week. Power     hydrALAZINE (APRESOLINE) 10 MG tablet TAKE 1 TABLET BY MOUTH EVERY 8 HOURS. 120  tablet 3   lamoTRIgine (LAMICTAL) 25 MG tablet Take 50 mg by mouth 2 (two) times daily.     leflunomide (ARAVA) 20 MG tablet TAKE 1 TABLET(20 MG) BY MOUTH DAILY 90 tablet 3   Lidocaine 4 % PTCH Place 1 patch onto the skin daily as needed (mild pain). Remove & Discard patch within 12 hours or as directed by MD     losartan (COZAAR) 100 MG tablet TAKE 1 TABLET(100 MG) BY MOUTH DAILY 90  tablet 2   melatonin 5 MG TABS Take 5 mg by mouth at bedtime as needed.     modafinil (PROVIGIL) 200 MG tablet Take 1 tablet (200 mg total) by mouth in the morning. 90 tablet 0   nitroGLYCERIN (NITROSTAT) 0.4 MG SL tablet Place 1 tablet (0.4 mg total) under the tongue every 5 (five) minutes as needed for chest pain. 30 tablet 0   pantoprazole (PROTONIX) 40 MG tablet Take 1 tablet (40 mg total) by mouth daily. 30 tablet 11   rosuvastatin (CRESTOR) 10 MG tablet TAKE 1 TABLET BY MOUTH DAILY 90 tablet 3   solifenacin (VESICARE) 5 MG tablet TAKE 1 TABLET(5 MG) BY MOUTH DAILY 90 tablet 3   traMADol (ULTRAM) 50 MG tablet Take 50 mg by mouth every 6 (six) hours as needed for moderate pain.     No facility-administered medications prior to visit.    PAST MEDICAL HISTORY: Past Medical History:  Diagnosis Date   Allergy    Dry eyes    Endometrial polyp    Essential hypertension 08/25/2018   GERD (gastroesophageal reflux disease)    Heart failure ([place])    History of hiatal hernia    History of kidney stones    Hot flashes, menopausal 10/13/2011   Estradiol started    Jaundice as teenager   no problems since   Memory loss 09/21/2019     1/2 of feet numb all the time   Multiple sclerosis ([place]) dx 2001   Neuropathy    Neuropathy    bilateral feet   Osteoarthritis    Sjogren's syndrome ([place]) dx oct 2021   sore muscvles, dry mouth and eyes   Stroke ([place])    Vertigo    Vision abnormalities     PAST SURGICAL HISTORY: Past Surgical History:  Procedure Laterality Date   ABDOMINAL HYSTERECTOMY  2002   partial    COLONOSCOPY  01/27/2022   2 day prep   colonscopy  2011   CORONARY STENT INTERVENTION N/A 07/18/2021   Procedure: CORONARY STENT INTERVENTION;  Surgeon: Jettie Booze, MD;  Location: [facility];  Service: Cardiovascular;  Laterality: N/A;   DILATATION & CURETTAGE/HYSTEROSCOPY WITH MYOSURE N/A 06/08/2020   Procedure: DILATATION & CURETTAGE/HYSTEROSCOPY/Polypectomy WITH MYOSURE;  Surgeon: Armandina Stammer, DO;  Location: [facility];  Service: Gynecology;  Laterality: N/A;   LEFT HEART CATH AND CORONARY ANGIOGRAPHY N/A 07/18/2021   Procedure: LEFT HEART CATH AND CORONARY ANGIOGRAPHY;  Surgeon: Jettie Booze, MD;  Location: [facility];  Service: Cardiovascular;  Laterality: N/A;   LUMBAR FUSION  2001   TOTAL HIP ARTHROPLASTY Right 01/17/2021   Procedure: TOTAL HIP ARTHROPLASTY ANTERIOR APPROACH;  Surgeon: Rod Can, MD;  Location: [facility];  Service: Orthopedics;  Laterality: Right;   UPPER GI ENDOSCOPY  yrs ago    FAMILY HISTORY: Family History  Problem Relation Age of Onset   Heart failure Mother    Heart failure Father    Arthritis Other    Hypertension Other    Colon cancer Neg Hx    Colon polyps Neg Hx    Esophageal cancer Neg Hx    Stomach cancer Neg Hx    Rectal cancer Neg Hx     SOCIAL HISTORY:  Social History   Socioeconomic History   Marital status: Unknown    Spouse name: Not on file   Number of children: Not on file   Years of education: Not on file   Highest education  level: Not on file  Occupational History   Occupation: retired  Tobacco Use   Smoking status: Former    Packs/day: 0.50    Years: 15.00    Total pack years: 7.50    Types: Cigarettes    Quit date: 08/05/1983    Years since quitting: 38.5   Smokeless tobacco: Never  Vaping Use   Vaping Use: Never used  Substance and Sexual Activity   Alcohol use: Yes    Comment: rarely, maybe yearly drink   Drug use: Never   Sexual activity: Not Currently     Birth control/protection: Post-menopausal  Other Topics Concern   Not on file  Social History Narrative   Regular Exercise-no   Widowed   Retired   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     Tobacco use, amount per day now: former   Past tobacco use, amount per day: 1/2-1 packet   How many years did you use tobacco: 12 years   Alcohol use (drinks per week): 0   Diet: mediterranean based   Do you drink/eat things with caffeine: yes   Marital status:        widow                          What year were you married? 1987   Do you live in a house, apartment, assisted living, condo, trailer, etc.? house   Is it one or more stories? 1 story   How many persons live in your home? 1   Do you have pets in your home?( please list) no   Current or past profession: Science writer    Do you exercise?          yes                        Type and how often? Tai chi and pt therapy, stretches etc....   Do you have a living will? yes   Do you have a DNR form?      no                             If not, do you want to discuss one? yes   Do you have signed POA/HPOA forms?      no                  If so, please bring to you appointment   Social Determinants of Health   Financial Resource Strain: Not on file  Food Insecurity: No Food Insecurity (01/07/2022)   Hunger Vital Sign    Worried About Running Out of Food in the Last Year: Never true    Ran Out of Food in the Last Year: Never true  Transportation Needs: No Transportation Needs (01/07/2022)   PRAPARE - Hydrologist (Medical): No    Lack of Transportation (Non-Medical): No  Physical Activity: Not on file  Stress: Not on file  Social Connections: Not on file  Intimate Partner Violence: Not At Risk (02/03/2022)   Humiliation, Afraid, Rape, and Kick  questionnaire    Fear of Current or Ex-Partner: No    Emotionally Abused: No  Physically Abused: No    Sexually Abused: No     PHYSICAL EXAM  Vitals:   02/05/22 0940  BP: 132/63  Pulse: 62  Weight: 166 lb 6.4 oz (75.5 kg)  Height: _0  (1.575 m)    Body mass index is 30.43 kg/m.   General: The patient is well-developed and well-nourished and in no acute distress  Musculoskeletal: She has right > left  trochanteric bursa tenderness.  Today there is no piriformis tenderness..     Neurologic Exam  Mental status: The patient is alert and oriented x 3 at the time of the examination.   apparently normal attention span, stm and concentration ability.   Speech is normal.      Cranial nerves: Extraocular movements are full.  Facial strength and sensory are normal.  Hearing appeared to be symmetric  Motor:  Muscle bulk is normal but tone is symmetric, more on the left.. Strength is 4/5 in median innervated and 4+/5 and ulnar innervated intrinsic hand muscles   Sensory:   Sensory testing is intact to touch and vibration in the arms.  She has reduced vibration sensation in the feet.  Coordination: Finger-nose-finger and heel-to-shin is performed better on the right than the left.  Gait and station: Station is normal.  Gait is wide.  Tandem gait is poor.  Romberg  Reflexes: Deep tendon reflexes are symmetric and normal bilaterally.      DIAGNOSTIC DATA (LABS, IMAGING, TESTING) - I reviewed patient records, labs, notes, testing and imaging myself where available.  Lab Results  Component Value Date   WBC 4.0 10/15/2021   HGB 13.2 10/15/2021   HCT 40.8 10/15/2021   MCV 90.3 10/15/2021   PLT 234 10/15/2021      Component Value Date/Time   NA 138 10/15/2021 0831   NA 138 07/12/2021 1031   K 4.4 10/15/2021 0831   CL 105 10/15/2021 0831   CO2 27 10/15/2021 0831   GLUCOSE 90 10/15/2021 0831   BUN 11 10/15/2021 0831   BUN 16 07/12/2021 1031   CREATININE 0.71 10/15/2021  0831   CALCIUM 9.4 10/15/2021 0831   PROT 5.9 (L) 10/15/2021 0831   PROT 6.1 10/02/2020 1607   ALBUMIN 3.4 (L) 06/16/2021 0222   ALBUMIN 4.2 10/02/2020 1607   AST 15 10/15/2021 0831   AST 20 03/09/2018 1342   ALT 14 10/15/2021 0831   ALT 22 03/09/2018 1342   ALKPHOS 101 06/16/2021 0222   BILITOT 0.3 10/15/2021 0831   BILITOT <0.2 10/02/2020 1607   BILITOT 0.4 03/09/2018 1342   GFRNONAA >60 06/16/2021 0222   GFRNONAA 91 01/25/2021 1527   GFRAA 105 01/25/2021 1527   Lab Results  Component Value Date   CHOL 144 10/15/2021   HDL 45 (L) 10/15/2021   LDLCALC 76 10/15/2021   LDLDIRECT 130.2 11/03/2011   TRIG 143 10/15/2021   CHOLHDL 3.2 10/15/2021   Lab Results  Component Value Date   HGBA1C 5.4 06/15/2021   No results found for: "VITAMINB12" Lab Results  Component Value Date   TSH 4.91 (H) 10/15/2021       ASSESSMENT AND PLAN  Multiple sclerosis (HCC)  High risk medication use  Chronic fatigue  Abnormality of gait  Multiple joint pain  Vitamin D deficiency  Trochanteric bursitis of both hips   1.    She will continue leflunomide for MS and Sjogren's.   Recent labs showed low lymphocytes (.568 last check in March 2023).   She has been on leflunomide  for many years 2.    Continue exercises and can renew physical therapy.   3.    Continue  clonazepam and modafinil.    Ad lamotrigine for neuropathy.   4.   She mighr benefit from RFA of L5S1 facet joint - she wil see Dr. Ron Agee 5.    Return to see me in about 6 months but is advised to call sooner if she has new or worsening neurologic symptoms.   Phil Corti A. Felecia Shelling, MD, PhD 9/75/8832, 54:98 AM Certified in Neurology, Clinical Neurophysiology, Sleep Medicine, Pain Medicine and Neuroimaging  [facility] [address] [place], [place] [address] [phone] \

## 2022-02-07 ENCOUNTER — Ambulatory Visit (HOSPITAL_BASED_OUTPATIENT_CLINIC_OR_DEPARTMENT_OTHER): Payer: Medicare Other | Admitting: Physical Therapy

## 2022-02-07 ENCOUNTER — Encounter (HOSPITAL_BASED_OUTPATIENT_CLINIC_OR_DEPARTMENT_OTHER): Payer: Self-pay | Admitting: Physical Therapy

## 2022-02-07 DIAGNOSIS — G8929 Other chronic pain: Secondary | ICD-10-CM

## 2022-02-07 DIAGNOSIS — M542 Cervicalgia: Secondary | ICD-10-CM

## 2022-02-07 DIAGNOSIS — M6281 Muscle weakness (generalized): Secondary | ICD-10-CM | POA: Diagnosis not present

## 2022-02-07 DIAGNOSIS — M545 Low back pain, unspecified: Secondary | ICD-10-CM | POA: Diagnosis not present

## 2022-02-07 DIAGNOSIS — R252 Cramp and spasm: Secondary | ICD-10-CM | POA: Diagnosis not present

## 2022-02-07 DIAGNOSIS — R262 Difficulty in walking, not elsewhere classified: Secondary | ICD-10-CM | POA: Diagnosis not present

## 2022-02-07 DIAGNOSIS — M5459 Other low back pain: Secondary | ICD-10-CM | POA: Diagnosis not present

## 2022-02-07 NOTE — Therapy (Signed)
[facility] [address] [place], [place], [address] Phone: [phone]   Fax:  [phone]  Physical Therapy Treatment  Patient Details  Name: Dawn [name] MRN: [id number] Date of Birth: Aug 15, 1945 Referring Provider (PT): Dr Arlice Colt   Encounter Date: 02/07/2022   PT End of Session - 02/07/22 1132     Visit Number 37    Number of Visits 4    Date for PT Re-Evaluation 03/30/22    Authorization Type progress note done on visit 36 next to be done on 72    PT Start Time 1100    PT Stop Time 1142    PT Time Calculation (min) 42 min    Activity Tolerance Patient tolerated treatment well    Behavior During Therapy [facility] for tasks assessed/performed             Past Medical History:  Diagnosis Date   Allergy    Dry eyes    Endometrial polyp    Essential hypertension 08/25/2018   GERD (gastroesophageal reflux disease)    Heart failure ([place])    History of hiatal hernia    History of kidney stones    Hot flashes, menopausal 10/13/2011   Estradiol started    Jaundice as teenager   no problems since   Memory loss 09/21/2019     1/2 of feet numb all the time   Multiple sclerosis ([place]) dx 2001   Neuropathy    Neuropathy    bilateral feet   Osteoarthritis    Sjogren's syndrome ([place]) dx oct 2021   sore muscvles, dry mouth and eyes   Stroke ([place])    Vertigo    Vision abnormalities     Past Surgical History:  Procedure Laterality Date   ABDOMINAL HYSTERECTOMY  2002   partial   COLONOSCOPY  01/27/2022   2 day prep   colonscopy  2011   CORONARY STENT INTERVENTION N/A 07/18/2021   Procedure: CORONARY STENT INTERVENTION;  Surgeon: Jettie Booze, MD;  Location: [facility];  Service: Cardiovascular;  Laterality: N/A;   DILATATION & CURETTAGE/HYSTEROSCOPY WITH MYOSURE N/A 06/08/2020   Procedure: DILATATION & CURETTAGE/HYSTEROSCOPY/Polypectomy WITH MYOSURE;  Surgeon: Armandina Stammer, DO;   Location: [facility];  Service: Gynecology;  Laterality: N/A;   LEFT HEART CATH AND CORONARY ANGIOGRAPHY N/A 07/18/2021   Procedure: LEFT HEART CATH AND CORONARY ANGIOGRAPHY;  Surgeon: Jettie Booze, MD;  Location: [facility];  Service: Cardiovascular;  Laterality: N/A;   LUMBAR FUSION  2001   TOTAL HIP ARTHROPLASTY Right 01/17/2021   Procedure: TOTAL HIP ARTHROPLASTY ANTERIOR APPROACH;  Surgeon: Rod Can, MD;  [facility];  Service: Orthopedics;  Laterality: Right;   UPPER GI ENDOSCOPY  yrs ago    There were no vitals filed for this visit.   Subjective Assessment - 02/07/22 1134     Subjective The pateint has been ot the MD. He feels she has an L5 nerve root compression She has been sore lately.    Pertinent History MS    How long can you stand comfortably? Hurts upon standing    How long can you walk comfortably? Limited community ambulation    Diagnostic tests Nothing recent    Patient Stated Goals to have less pain with ambualtion    Currently in Pain? Yes    Pain Score 4     Pain Location Hip    Pain Orientation Right    Pain Descriptors /  Indicators Aching    Pain Type Chronic pain    Pain Onset More than a month ago    Pain Frequency Intermittent    Aggravating Factors  pain with walking    Pain Relieving Factors rest    Effect of Pain on Daily Activities difficulty with community ambualtion    Multiple Pain Sites No              Manual: trigger point release to the lower lumbar spine and right gluteal   Trigger Point Dry-Needling  Treatment instructions: Expect mild to moderate muscle soreness. S/S of pneumothorax if dry needled over a lung field, and to seek immediate medical attention should they occur. Patient verbalized understanding of these instructions and education.  Patient Consent Given: Yes Education handout provided: Previously provided Muscles treated: 3 spots in the right gluteal using a .30x75  needle Electrical stimulation performed: No Parameters: N/A Treatment response/outcome: great twtih response with each needle   Supine: LTR x30  Supine march 2x20  Supine clams shell red 2x20   Seated LAq x20 each leg   Standing weight shift x20   Standing low march 2x10                            PT Education - 02/07/22 1206     Education Details benefits and risks of TPDN    Person(s) Educated Patient    Methods Explanation;Demonstration;Tactile cues;Verbal cues    Comprehension Verbalized understanding;Returned demonstration              PT Short Term Goals - 02/02/22 2042       PT SHORT TERM GOAL #1   Title Patient will report  <3/10 pain in the back and right hip    Baseline revised for hip and back    Time 4    Period Weeks    Status Revised    Target Date 03/02/22      PT SHORT TERM GOAL #2   Title Patient will ambaulte 1200 with no significant spike in B/P or HR    Baseline 840 today    Time 4    Period Weeks    Status On-going    Target Date 01/24/22      PT SHORT TERM GOAL #3   Title Patient increase right hip flexion and abducion strength by 5 lbs    Baseline more limited since recent onset of pain    Time 4    Period Weeks    Status On-going    Target Date 03/02/22      PT SHORT TERM GOAL #4   Title Patient will be indepdnent with basic endruance and right hip strengthening program    Baseline working on base program    Time 4    Period Weeks    Status Achieved    Target Date 03/02/22               PT Long Term Goals - 02/02/22 2046       PT LONG TERM GOAL #1   Title Patient will return to [facility] with a full program for her hip/ lower back/ and endurance    Baseline was progressing towards returning until recent flair up    Time 8    Period Weeks    Status On-going      PT LONG TERM GOAL #2   Title Patient will ambualte 1200' the 6 min walk test  in order to improve community ambualtion     Baseline 840    Time 8    Period Weeks    Status On-going    Target Date 03/30/22      PT LONG TERM GOAL #3   Title Patient will stand for 30 min without a significant increase in pain in order to perfrom ADL's    Baseline >15 min    Time 8    Period Weeks    Status On-going    Target Date 03/30/22                   Plan - 02/07/22 1206     Clinical Impression Statement The patient had a great twitch reponse to dry needling. She reports she could feel improvement right away. We also performed trigger point release to her lumbar spine. She had significant tightness in her lower lumbar spine. We reviewedd some of her base exercises.    Personal Factors and Comorbidities Comorbidity 1;Comorbidity 2    Comorbidities MS, OA    Examination-Activity Limitations Bed Mobility    Examination-Participation Restrictions Meal Prep;Cleaning;Community Activity;Laundry;Shop;Occupation    Stability/Clinical Decision Making Stable/Uncomplicated    Clinical Decision Making Low    Rehab Potential Good    PT Frequency 1x / week    PT Duration 8 weeks    PT Treatment/Interventions ADLs/Self Care Home Management;Electrical Stimulation;Cryotherapy;Iontophoresis '4mg'$ /ml Dexamethasone;Moist Heat;Traction;DME Instruction;Neuromuscular re-education;Patient/family education;Manual techniques;Passive range of motion;Taping;Therapeutic activities;Therapeutic exercise;Balance training;Gait training;Stair training;Functional mobility training;Dry needling;Ultrasound    PT Next Visit Plan add in endurance exercises; review tolerance to interval training    PT Home Exercise Plan continue with progressiv estrengthening porgram    Consulted and Agree with Plan of Care Patient             Patient will benefit from skilled therapeutic intervention in order to improve the following deficits and impairments:  Abnormal gait, Difficulty walking, Decreased range of motion, Decreased mobility, Decreased strength,  Postural dysfunction, Pain, Increased muscle spasms, Decreased activity tolerance, Increased fascial restricitons  Visit Diagnosis: Difficulty in walking, not elsewhere classified  Cramp and spasm  Other low back pain  Muscle weakness (generalized)  Cervicalgia  Chronic bilateral low back pain without sciatica     Problem List Patient Active Problem List   Diagnosis Date Noted   Positive colorectal cancer screening using Cologuard test 11/14/2021   Coronary artery disease    Acute diastolic CHF (congestive heart failure) ([place]) 06/15/2021   Demand ischemia ([place]) 06/15/2021   Chest pain 06/14/2021   Elevated troponin 06/14/2021   Statin intolerance 04/19/2021   Osteoarthritis of right hip 01/17/2021   Status post THR (total hip replacement) 01/17/2021   Chronic bilateral low back pain without sciatica 10/02/2020   Chronic pain syndrome 06/20/2020   Post laminectomy syndrome 06/20/2020   Sjogren's disease ([place]) 05/23/2020   Primary osteoarthritis of both hands 05/23/2020   Primary osteoarthritis of both feet 05/23/2020   Sacroiliitis, not elsewhere classified ([place]) 12/22/2019   Other secondary scoliosis, lumbar region 12/22/2019   Spondylosis without myelopathy or radiculopathy, lumbar region 12/22/2019   Cervical radiculopathy 10/18/2019   Numbness 09/21/2019   Bilateral carpal tunnel syndrome 09/21/2019   High risk medication use 09/21/2019   Memory loss 09/21/2019   Essential hypertension 08/25/2018   Abnormal SPEP 02/19/2018   Hand pain 02/20/2017   Multiple joint pain 02/18/2017   Right elbow pain 12/09/2016   Disturbed cognition 06/25/2016   Trochanteric bursitis of left hip 02/18/2016  Sciatica, right side 10/30/2015   Bilateral arm pain 10/10/2015   Trochanteric bursitis of both hips 08/04/2014   Chronic fatigue 08/04/2014   Urinary frequency 08/04/2014   Abnormality of gait 08/04/2014   Unspecified visual disturbance 01/31/2013   Transient vision  disturbance 01/31/2013   Routine general medical examination at a health care facility 11/04/2011   Colon polyps 11/03/2011   Hot flashes, menopausal 10/13/2011   POSTHERPETIC NEURALGIA 10/03/2009   DISPLCMT LUMBAR INTERVERT DISC W/O MYELOPATHY 09/05/2009   RENAL CALCULUS, RECURRENT 09/04/2009   Hyperlipidemia 08/31/2009   Multiple sclerosis ([place]) 08/31/2009   ALLERGIC RHINITIS 08/31/2009    Carney Living, PT 02/07/2022, 12:10 PM  [facility] [place], [place], [address] Phone: [phone]   Fax:  [phone]  Name: Dawn [name] MRN: [id number] Date of Birth: [birth year]

## 2022-02-10 ENCOUNTER — Encounter: Payer: Self-pay | Admitting: Family

## 2022-02-10 ENCOUNTER — Ambulatory Visit (INDEPENDENT_AMBULATORY_CARE_PROVIDER_SITE_OTHER): Payer: Medicare Other | Admitting: Family

## 2022-02-10 VITALS — BP 150/70 | HR 82 | Temp 96.8°F | Resp 16 | Ht 62.0 in | Wt 161.8 lb

## 2022-02-10 DIAGNOSIS — J029 Acute pharyngitis, unspecified: Secondary | ICD-10-CM | POA: Diagnosis not present

## 2022-02-10 DIAGNOSIS — I2510 Atherosclerotic heart disease of native coronary artery without angina pectoris: Secondary | ICD-10-CM

## 2022-02-10 MED ORDER — AMOXICILLIN-POT CLAVULANATE 875-125 MG PO TABS: 1.0000 | ORAL_TABLET | Freq: Two times a day (BID) | ORAL | 0 refills | Status: AC

## 2022-02-10 NOTE — Progress Notes (Signed)
Provider: Marlowe Sax FNP-C  Soo Steelman, [name], NP  Patient Care Team: Karmyn Lowman, [name], NP as PCP - General (Family Medicine) Janina Mayo, MD as PCP - Cardiology (Cardiology) [name], MD as Consulting Physician (Orthopedic Surgery) [name], [name], MD (Neurology) [name], [name], [place] (Optometry) [name], NP as Nurse Practitioner (Obstetrics and Gynecology) [name], MD as Consulting Physician (Dermatology)  Extended Emergency Contact Information Primary Emergency Contact: [facility] Phone: [phone] Mobile Phone: [phone] Relation: Friend Secondary Emergency Contact: [place] Mobile Phone: [phone] Relation: Friend  Code Status:  Full Code  Goals of care: Advanced Directive information    02/10/2022    3:06 PM  Advanced Directives  Does Patient Have a Medical Advance Directive? Yes  Type of Advance Directive Living will  Does patient want to make changes to medical advance directive? No - Patient declined     Chief Complaint  Patient presents with   Acute Visit    Patient complains of sore throat, sneezing, ear pain, body aches, chills, fever 100.4 degrees farenheit, and congestion.Patient states this has been ongoing since March 2023.    HPI:  Pt is a 76 y.o. female seen today for an acute visit for evaluation of sore throat ,sneezing,ear pain,body aches ,chills,fever 100.4 and congestion on and off since march ,2023. She denies any cough,fatigue,runny nose,chest tightness,chest pain,palpitation or shortness of breath. Allegra has been effective.  Past Medical History:  Diagnosis Date   Allergy    Dry eyes    Endometrial polyp    Essential hypertension 08/25/2018   GERD (gastroesophageal reflux disease)    Heart failure ([facility])    History of hiatal hernia    History of kidney stones    Hot flashes, menopausal 10/13/2011   Estradiol started    Jaundice as teenager   no problems since   Memory loss  09/21/2019     1/2 of feet numb all the time   Multiple sclerosis ([place]) dx 2001   Neuropathy    Neuropathy    bilateral feet   Osteoarthritis    Sjogren's syndrome ([place]) dx oct 2021   sore muscvles, dry mouth and eyes   Stroke [facility])    Vertigo    Vision abnormalities    Past Surgical History:  Procedure Laterality Date   ABDOMINAL HYSTERECTOMY  2002   partial   COLONOSCOPY  01/27/2022   2 day prep   colonscopy  2011   CORONARY STENT INTERVENTION N/A 07/18/2021   Procedure: CORONARY STENT INTERVENTION;  Surgeon: Jettie Booze, MD;  Location: [facility];  Service: Cardiovascular;  Laterality: N/A;   DILATATION & CURETTAGE/HYSTEROSCOPY WITH MYOSURE N/A 06/08/2020   Procedure: DILATATION & CURETTAGE/HYSTEROSCOPY/Polypectomy WITH MYOSURE;  Surgeon: Armandina Stammer, DO;  Location: [facility];  Service: Gynecology;  Laterality: N/A;   LEFT HEART CATH AND CORONARY ANGIOGRAPHY N/A 07/18/2021   Procedure: LEFT HEART CATH AND CORONARY ANGIOGRAPHY;  Surgeon: Jettie Booze, MD;  Location: [facility];  Service: Cardiovascular;  Laterality: N/A;   LUMBAR FUSION  2001   TOTAL HIP ARTHROPLASTY Right 01/17/2021   Procedure: TOTAL HIP ARTHROPLASTY ANTERIOR APPROACH;  Surgeon: Rod Can, MD;  [facility]: [facility];  Service: Orthopedics;  Laterality: Right;   UPPER GI ENDOSCOPY  yrs ago    Allergies  Allergen Reactions   Statins Other (See Comments)    Muscle pain    Outpatient Encounter Medications as of 02/10/2022  Medication Sig   aspirin EC 81  MG tablet Take 81 mg by mouth daily. Swallow whole.   carvedilol (COREG) 6.25 MG tablet Take 1 tablet (6.25 mg total) by mouth 2 (two) times daily with a meal.   cholecalciferol (VITAMIN D) 25 MCG (1000 UNIT) tablet Take 2,000 Units by mouth daily.   clonazePAM (KLONOPIN) 0.5 MG tablet TAKE 1 TABLET(0.5 MG) BY MOUTH AT BEDTIME   clopidogrel (PLAVIX) 75 MG tablet Take 75 mg by mouth daily.    fexofenadine (ALLEGRA) 180 MG tablet Take 180 mg by mouth at bedtime.    Flaxseed, Linseed, (FLAXSEED OIL PO) Take 5-10 mLs by mouth 3 (three) times a week. Power   hydrALAZINE (APRESOLINE) 10 MG tablet TAKE 1 TABLET BY MOUTH EVERY 8 HOURS.   lamoTRIgine (LAMICTAL) 25 MG tablet Take 50 mg by mouth 2 (two) times daily.   leflunomide (ARAVA) 20 MG tablet TAKE 1 TABLET(20 MG) BY MOUTH DAILY   Lidocaine 4 % PTCH Place 1 patch onto the skin daily as needed (mild pain). Remove & Discard patch within 12 hours or as directed by MD   losartan (COZAAR) 100 MG tablet TAKE 1 TABLET(100 MG) BY MOUTH DAILY   melatonin 5 MG TABS Take 5 mg by mouth at bedtime as needed.   methylPREDNISolone (MEDROL) 4 MG tablet Taper from 6 pills po for one day to 1 pill po the last day over 6 days   modafinil (PROVIGIL) 200 MG tablet Take 1 tablet (200 mg total) by mouth in the morning.   nitroGLYCERIN (NITROSTAT) 0.4 MG SL tablet Place 1 tablet (0.4 mg total) under the tongue every 5 (five) minutes as needed for chest pain.   pantoprazole (PROTONIX) 40 MG tablet Take 1 tablet (40 mg total) by mouth daily.   rosuvastatin (CRESTOR) 10 MG tablet TAKE 1 TABLET BY MOUTH DAILY   traMADol (ULTRAM) 50 MG tablet Take 50 mg by mouth as needed for moderate pain.   [DISCONTINUED] solifenacin (VESICARE) 5 MG tablet TAKE 1 TABLET(5 MG) BY MOUTH DAILY   No facility-administered encounter medications on file as of 02/10/2022.    Review of Systems  Constitutional:  Positive for chills and fever. Negative for appetite change, fatigue and unexpected weight change.  HENT:  Positive for congestion, ear pain, sneezing and sore throat. Negative for dental problem, ear discharge, facial swelling, hearing loss, nosebleeds, postnasal drip, rhinorrhea, sinus pressure, sinus pain and tinnitus.   Eyes:  Negative for pain, discharge, redness, itching and visual disturbance.  Respiratory:  Negative for cough, chest tightness, shortness of breath and  wheezing.   Cardiovascular:  Negative for chest pain, palpitations and leg swelling.  Gastrointestinal:  Negative for abdominal distention, abdominal pain, constipation, nausea and vomiting.  Skin:  Negative for color change, pallor and rash.  Neurological:  Negative for dizziness, speech difficulty, weakness, light-headedness, numbness and headaches.    Immunization History  Administered Date(s) Administered   Fluad Quad(high Dose 65+) 04/01/2019, 04/19/2021   Influenza Whole 05/14/2011   Influenza, High Dose Seasonal PF 04/30/2017, 05/15/2018, 05/18/2020   PFIZER(Purple Top)SARS-COV-2 Vaccination 08/27/2019, 09/21/2019, 03/07/2020, 11/16/2020   Pneumococcal Conjugate-13 08/30/2018   Pneumococcal Polysaccharide-23 03/21/2009, 04/20/2020   Td 12/04/2009   Zoster, Live 11/29/2009   Pertinent  Health Maintenance Due  Topic Date Due   DEXA SCAN  07/21/2022 (Originally 12/20/2012)   INFLUENZA VACCINE  02/18/2022   MAMMOGRAM  04/16/2023      06/17/2021    3:25 PM 07/25/2021   10:51 AM 10/18/2021   10:45 AM 01/07/2022  9:50 AM 02/10/2022    3:05 PM  Fall Risk  Falls in the past year? 0 0 0 0 0  Was there an injury with Fall? 0 0 0  0  Fall Risk Category Calculator 0 0 0  0  Fall Risk Category Low Low Low  Low  Patient Fall Risk Level Low fall risk Low fall risk Low fall risk  Low fall risk  Patient at Risk for Falls Due to No Fall Risks No Fall Risks No Fall Risks  No Fall Risks  Fall risk Follow up Falls evaluation completed Falls evaluation completed Falls evaluation completed  Falls evaluation completed   Functional Status Survey:    Vitals:   02/10/22 1501  BP: (!) 150/70  Pulse: 82  Resp: 16  Temp: (!) 96.8 F (36 C)  SpO2: 97%  Weight: 161 lb 12.8 oz (73.4 kg)  Height: '5\' 2"'$  (1.575 m)   Body mass index is 29.59 kg/m. Physical Exam Vitals reviewed.  Constitutional:      General: She is not in acute distress.    Appearance: Normal appearance. She is normal weight.  She is not ill-appearing or diaphoretic.  HENT:     Head: Normocephalic.     Right Ear: Tympanic membrane, ear canal and external ear normal. There is no impacted cerumen.     Left Ear: Tympanic membrane, ear canal and external ear normal. There is no impacted cerumen.     Nose: Nose normal. No congestion or rhinorrhea.     Mouth/Throat:     Mouth: Mucous membranes are moist.     Pharynx: Oropharynx is clear. Posterior oropharyngeal erythema present. No oropharyngeal exudate.  Eyes:     General: No scleral icterus.       Right eye: No discharge.        Left eye: No discharge.     Extraocular Movements: Extraocular movements intact.     Conjunctiva/sclera: Conjunctivae normal.     Pupils: Pupils are equal, round, and reactive to light.  Neck:     Vascular: No carotid bruit.  Cardiovascular:     Rate and Rhythm: Normal rate and regular rhythm.     Pulses: Normal pulses.     Heart sounds: Normal heart sounds. No murmur heard.    No friction rub. No gallop.  Pulmonary:     Effort: Pulmonary effort is normal. No respiratory distress.     Breath sounds: Normal breath sounds. No wheezing, rhonchi or rales.  Chest:     Chest wall: No tenderness.  Abdominal:     General: Bowel sounds are normal. There is no distension.     Palpations: Abdomen is soft. There is no mass.     Tenderness: There is no abdominal tenderness. There is no right CVA tenderness, left CVA tenderness, guarding or rebound.  Musculoskeletal:     Cervical back: Normal range of motion. No rigidity or tenderness.  Lymphadenopathy:     Cervical: No cervical adenopathy.  Skin:    General: Skin is warm and dry.     Coloration: Skin is not pale.     Findings: No erythema or rash.  Neurological:     Mental Status: She is alert and oriented to person, place, and time.     Motor: No weakness.     Gait: Gait normal.  Psychiatric:        Speech: Speech normal.     Labs reviewed: Recent Labs    06/15/21 0638  06/16/21 0222 07/08/21  7106 07/12/21 1031 07/25/21 1135 10/15/21 0831  NA  --  138   < > 138 138 138  K  --  4.5   < > 4.4 4.5 4.4  CL  --  109   < > 102 104 105  CO2  --  24   < > '25 26 27  '$ GLUCOSE  --  91   < > 86 88 90  BUN  --  16   < > '16 17 11  '$ CREATININE  --  0.72   < > 0.68 0.67 0.71  CALCIUM  --  8.7*   < > 9.5 9.9 9.4  MG 1.6* 2.3  --   --   --   --   PHOS 3.9 3.9  --   --   --   --    < > = values in this interval not displayed.   Recent Labs    06/16/21 0222 10/15/21 0831  AST 17 15  ALT 16 14  ALKPHOS 101  --   BILITOT 0.4 0.3  PROT 5.6* 5.9*  ALBUMIN 3.4*  --    Recent Labs    07/25/21 1135 08/01/21 1049 10/15/21 0831  WBC 4.4 6.7 4.0  NEUTROABS 3,318 5.1 2,824  HGB 13.5 13.0 13.2  HCT 41.1 38.0 40.8  MCV 88.2 86 90.3  PLT 241 261 234   Lab Results  Component Value Date   TSH 4.91 (H) 10/15/2021   Lab Results  Component Value Date   HGBA1C 5.4 06/15/2021   Lab Results  Component Value Date   CHOL 144 10/15/2021   HDL 45 (L) 10/15/2021   LDLCALC 76 10/15/2021   LDLDIRECT 130.2 11/03/2011   TRIG 143 10/15/2021   CHOLHDL 3.2 10/15/2021    Significant Diagnostic Results in last 30 days:  No results found.  Assessment/Plan  Acute pharyngitis, unspecified etiology Afebrile - Advised to gurgle throat with warm water and salt at least once daily  - Increase fluid intake /soup/teas  - Tylenol as needed for pain  - will start on Augmentin   Family/ staff Communication: Reviewed plan of care with patient verbalized understanding  Labs/tests ordered: None   Next Appointment: Return if symptoms worsen or fail to improve.   Sandrea Hughs, NP

## 2022-02-14 ENCOUNTER — Ambulatory Visit (HOSPITAL_BASED_OUTPATIENT_CLINIC_OR_DEPARTMENT_OTHER): Payer: Medicare Other | Admitting: Physical Therapy

## 2022-02-17 DIAGNOSIS — M47816 Spondylosis without myelopathy or radiculopathy, lumbar region: Secondary | ICD-10-CM | POA: Diagnosis not present

## 2022-02-26 ENCOUNTER — Encounter (HOSPITAL_BASED_OUTPATIENT_CLINIC_OR_DEPARTMENT_OTHER): Payer: Medicare Other | Admitting: Physical Therapy

## 2022-02-26 DIAGNOSIS — H0014 Chalazion left upper eyelid: Secondary | ICD-10-CM | POA: Diagnosis not present

## 2022-02-28 ENCOUNTER — Encounter (HOSPITAL_BASED_OUTPATIENT_CLINIC_OR_DEPARTMENT_OTHER): Payer: Self-pay | Admitting: Physical Therapy

## 2022-02-28 ENCOUNTER — Ambulatory Visit (HOSPITAL_BASED_OUTPATIENT_CLINIC_OR_DEPARTMENT_OTHER): Payer: Medicare Other | Attending: Neurology | Admitting: Physical Therapy

## 2022-02-28 DIAGNOSIS — M542 Cervicalgia: Secondary | ICD-10-CM | POA: Insufficient documentation

## 2022-02-28 DIAGNOSIS — R262 Difficulty in walking, not elsewhere classified: Secondary | ICD-10-CM | POA: Diagnosis not present

## 2022-02-28 DIAGNOSIS — M545 Low back pain, unspecified: Secondary | ICD-10-CM | POA: Insufficient documentation

## 2022-02-28 DIAGNOSIS — R252 Cramp and spasm: Secondary | ICD-10-CM

## 2022-02-28 DIAGNOSIS — M6281 Muscle weakness (generalized): Secondary | ICD-10-CM | POA: Diagnosis not present

## 2022-02-28 DIAGNOSIS — G8929 Other chronic pain: Secondary | ICD-10-CM | POA: Insufficient documentation

## 2022-02-28 DIAGNOSIS — M5459 Other low back pain: Secondary | ICD-10-CM

## 2022-02-28 NOTE — Therapy (Signed)
[facility] [address] [place], [place], [address] Phone: [phone]   Fax:  [phone]  Physical Therapy Treatment  Patient Details  Name: Dawn [name] MRN: [id number] Date of Birth: March 02, 1946 Referring Provider (PT): Dr Arlice Colt   Encounter Date: 02/28/2022   PT End of Session - 02/28/22 1105     Visit Number 38    Number of Visits 72    Date for PT Re-Evaluation 03/30/22    Authorization Type progress note done on visit 36 next to be done on 4    PT Start Time 1100    PT Stop Time 1143    PT Time Calculation (min) 43 min    Activity Tolerance Patient tolerated treatment well    Behavior During Therapy [facility] for tasks assessed/performed             Past Medical History:  Diagnosis Date   Allergy    Dry eyes    Endometrial polyp    Essential hypertension 08/25/2018   GERD (gastroesophageal reflux disease)    Heart failure ([place])    History of hiatal hernia    History of kidney stones    Hot flashes, menopausal 10/13/2011   Estradiol started    Jaundice as teenager   no problems since   Memory loss 09/21/2019     1/2 of feet numb all the time   Multiple sclerosis ([place]) dx 2001   Neuropathy    Neuropathy    bilateral feet   Osteoarthritis    Sjogren's syndrome ([place]) dx oct 2021   sore muscvles, dry mouth and eyes   Stroke ([place])    Vertigo    Vision abnormalities     Past Surgical History:  Procedure Laterality Date   ABDOMINAL HYSTERECTOMY  2002   partial   COLONOSCOPY  01/27/2022   2 day prep   colonscopy  2011   CORONARY STENT INTERVENTION N/A 07/18/2021   Procedure: CORONARY STENT INTERVENTION;  Surgeon: Jettie Booze, MD;  Location: [facility];  Service: Cardiovascular;  Laterality: N/A;   DILATATION & CURETTAGE/HYSTEROSCOPY WITH MYOSURE N/A 06/08/2020   Procedure: DILATATION & CURETTAGE/HYSTEROSCOPY/Polypectomy WITH MYOSURE;  Surgeon: Armandina Stammer, DO;   Location: [facility];  Service: Gynecology;  Laterality: N/A;   LEFT HEART CATH AND CORONARY ANGIOGRAPHY N/A 07/18/2021   Procedure: LEFT HEART CATH AND CORONARY ANGIOGRAPHY;  Surgeon: Jettie Booze, MD;  Location: [facility];  Service: Cardiovascular;  Laterality: N/A;   LUMBAR FUSION  2001   TOTAL HIP ARTHROPLASTY Right 01/17/2021   Procedure: TOTAL HIP ARTHROPLASTY ANTERIOR APPROACH;  Surgeon: Rod Can, MD;  Location[facility];  Service: Orthopedics;  Laterality: Right;   UPPER GI ENDOSCOPY  yrs ago    There were no vitals filed for this visit.   Subjective Assessment - 02/28/22 1107     Subjective The patient has been sick. She is feeling better. She has revcieved injections which have helped her back. her hips were hurting but they have improved as well.    Currently in Pain? No/denies   better sice the shots                   LTR x20 Double Knee to chest with ball x20  Heel press into the ball x20  Ball rotation x20   Supine wand flexion x20 3lb weight  Supine march x20   LAQ x20   Shoulder extension x20 red  Row x20 red  Triceps reds                       PT Education - 02/28/22 1108     Education Details HEP, symptom mangement    Person(s) Educated Patient    Methods Explanation;Demonstration;Verbal cues;Tactile cues    Comprehension Verbalized understanding;Returned demonstration;Verbal cues required;Tactile cues required              PT Short Term Goals - 02/02/22 2042       PT SHORT TERM GOAL #1   Title Patient will report  <3/10 pain in the back and right hip    Baseline revised for hip and back    Time 4    Period Weeks    Status Revised    Target Date 03/02/22      PT SHORT TERM GOAL #2   Title Patient will ambaulte 1200 with no significant spike in B/P or HR    Baseline 840 today    Time 4    Period Weeks    Status On-going    Target Date 01/24/22      PT SHORT TERM GOAL #3    Title Patient increase right hip flexion and abducion strength by 5 lbs    Baseline more limited since recent onset of pain    Time 4    Period Weeks    Status On-going    Target Date 03/02/22      PT SHORT TERM GOAL #4   Title Patient will be indepdnent with basic endruance and right hip strengthening program    Baseline working on base program    Time 4    Period Weeks    Status Achieved    Target Date 03/02/22               PT Long Term Goals - 02/02/22 2046       PT LONG TERM GOAL #1   Title Patient will return to [facility] with a full program for her hip/ lower back/ and endurance    Baseline was progressing towards returning until recent flair up    Time 8    Period Weeks    Status On-going      PT LONG TERM GOAL #2   Title Patient will ambualte 1200' the 6 min walk test in order to improve community ambualtion    Baseline 840    Time 8    Period Weeks    Status On-going    Target Date 03/30/22      PT LONG TERM GOAL #3   Title Patient will stand for 30 min without a significant increase in pain in order to perfrom ADL's    Baseline >15 min    Time 8    Period Weeks    Status On-going    Target Date 03/30/22                   Plan - 02/28/22 1213     Clinical Impression Statement Therapy focused more on strengthening today.She was not having much pain. On days where she is not hurting we will maximize or strengthening> She tolerated well. We supplimented some UE core. Therapy will continue to progress as tolerated.    Personal Factors and Comorbidities Comorbidity 1;Comorbidity 2    Examination-Activity Limitations Bed Mobility    Examination-Participation Restrictions Meal Prep;Cleaning;Community Activity;Laundry;Shop;Occupation    Stability/Clinical Decision Making Stable/Uncomplicated    Clinical Decision Making  Low    Rehab Potential Good    PT Frequency 1x / week    PT Duration 8 weeks    PT Treatment/Interventions ADLs/Self Care  Home Management;Electrical Stimulation;Cryotherapy;Iontophoresis '4mg'$ /ml Dexamethasone;Moist Heat;Traction;DME Instruction;Neuromuscular re-education;Patient/family education;Manual techniques;Passive range of motion;Taping;Therapeutic activities;Therapeutic exercise;Balance training;Gait training;Stair training;Functional mobility training;Dry needling;Ultrasound    PT Next Visit Plan add in endurance exercises; review tolerance to interval training    PT Home Exercise Plan continue with progressiv estrengthening porgram    Consulted and Agree with Plan of Care Patient             Patient will benefit from skilled therapeutic intervention in order to improve the following deficits and impairments:  Abnormal gait, Difficulty walking, Decreased range of motion, Decreased mobility, Decreased strength, Postural dysfunction, Pain, Increased muscle spasms, Decreased activity tolerance, Increased fascial restricitons  Visit Diagnosis: Difficulty in walking, not elsewhere classified  Cramp and spasm  Other low back pain  Muscle weakness (generalized)     Problem List Patient Active Problem List   Diagnosis Date Noted   Positive colorectal cancer screening using Cologuard test 11/14/2021   Coronary artery disease    Acute diastolic CHF (congestive heart failure) ([place]) 06/15/2021   Demand ischemia ([place]) 06/15/2021   Chest pain 06/14/2021   Elevated troponin 06/14/2021   Statin intolerance 04/19/2021   Osteoarthritis of right hip 01/17/2021   Status post THR (total hip replacement) 01/17/2021   Chronic bilateral low back pain without sciatica 10/02/2020   Chronic pain syndrome 06/20/2020   Post laminectomy syndrome 06/20/2020   Sjogren's disease ([place]) 05/23/2020   Primary osteoarthritis of both hands 05/23/2020   Primary osteoarthritis of both feet 05/23/2020   Sacroiliitis, not elsewhere classified ([place]) 12/22/2019   Other secondary scoliosis, lumbar region 12/22/2019   Spondylosis  without myelopathy or radiculopathy, lumbar region 12/22/2019   Cervical radiculopathy 10/18/2019   Numbness 09/21/2019   Bilateral carpal tunnel syndrome 09/21/2019   High risk medication use 09/21/2019   Memory loss 09/21/2019   Essential hypertension 08/25/2018   Abnormal SPEP 02/19/2018   Hand pain 02/20/2017   Multiple joint pain 02/18/2017   Right elbow pain 12/09/2016   Disturbed cognition 06/25/2016   Trochanteric bursitis of left hip 02/18/2016   Sciatica, right side 10/30/2015   Bilateral arm pain 10/10/2015   Trochanteric bursitis of both hips 08/04/2014   Chronic fatigue 08/04/2014   Urinary frequency 08/04/2014   Abnormality of gait 08/04/2014   Unspecified visual disturbance 01/31/2013   Transient vision disturbance 01/31/2013   Routine general medical examination at a health care facility 11/04/2011   Colon polyps 11/03/2011   Hot flashes, menopausal 10/13/2011   POSTHERPETIC NEURALGIA 10/03/2009   DISPLCMT LUMBAR INTERVERT DISC W/O MYELOPATHY 09/05/2009   RENAL CALCULUS, RECURRENT 09/04/2009   Hyperlipidemia 08/31/2009   Multiple sclerosis ([place]) 08/31/2009   ALLERGIC RHINITIS 08/31/2009    Carney Living, PT 02/28/2022, 12:19 PM  [facility] [address] [place], [place], [address] Phone: [phone]   Fax:  [phone]  Name: Dawn [name] MRN: [id number] Date of Birth: Apr 02, 1946

## 2022-03-04 DIAGNOSIS — Z23 Encounter for immunization: Secondary | ICD-10-CM | POA: Diagnosis not present

## 2022-03-04 IMAGING — MG DIGITAL SCREENING BILAT W/ CAD
4 series · 4 of 4 positions shown · non-contrast
Comparison: Previous exam(s).

CLINICAL DATA: Screening.

EXAM:
DIGITAL SCREENING BILATERAL MAMMOGRAM WITH CAD

[L MLO]
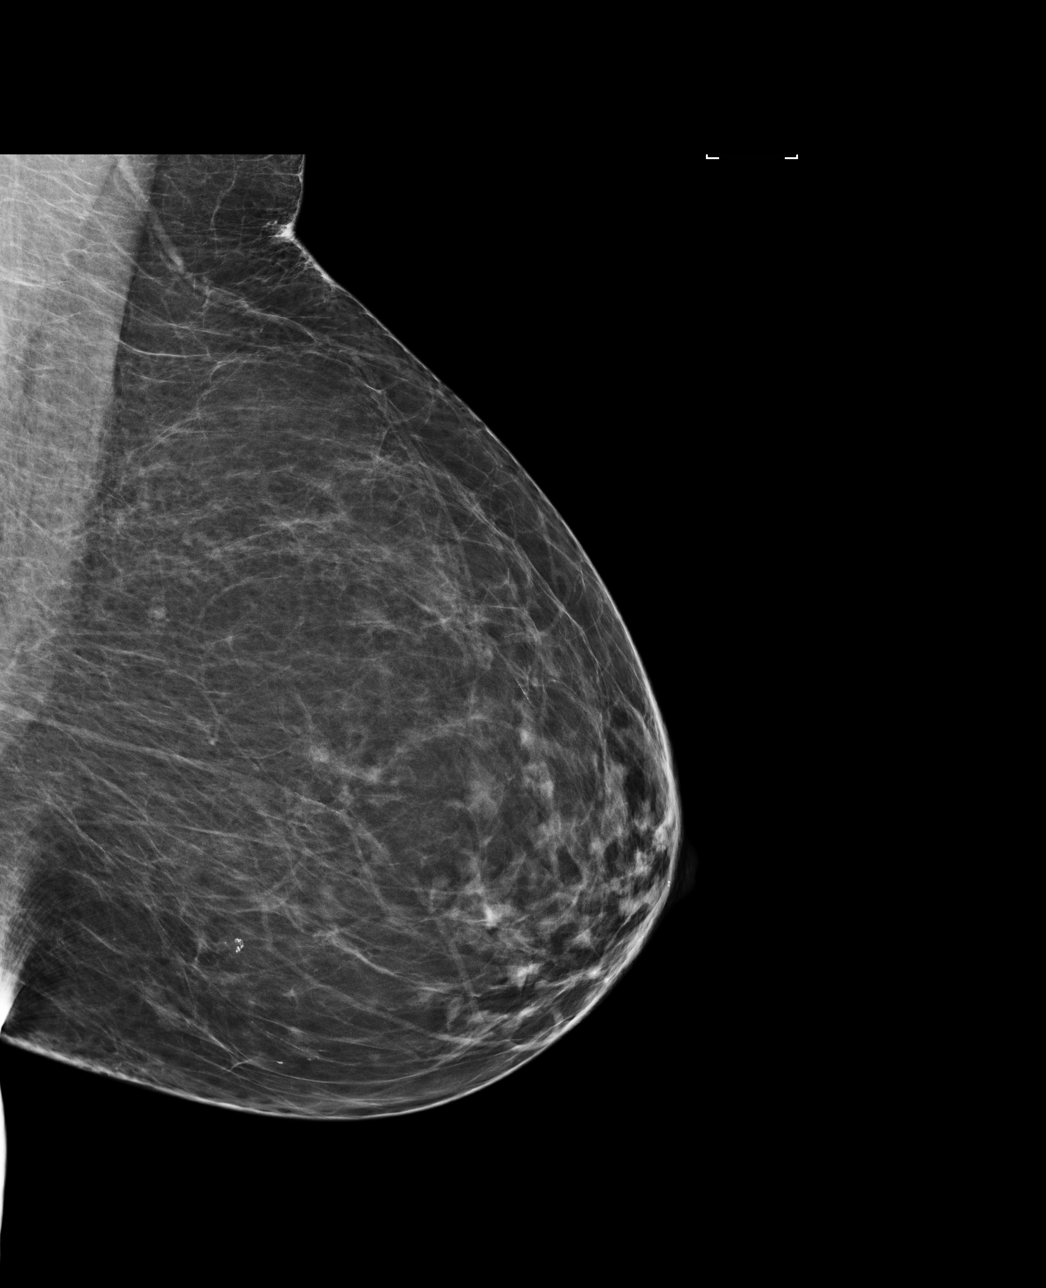

[R MLO]
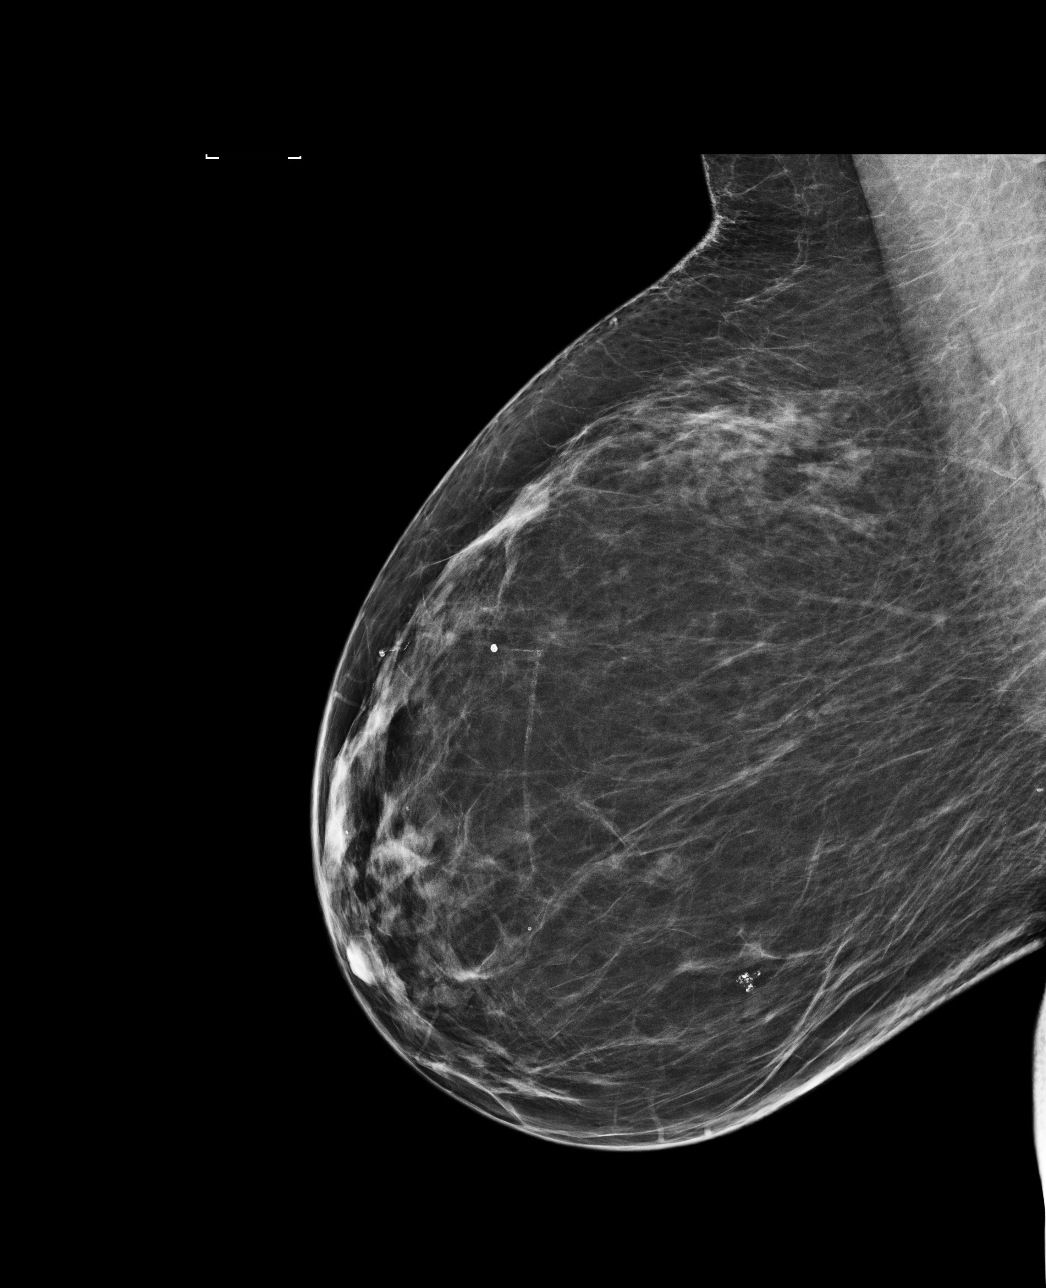

[R CC]
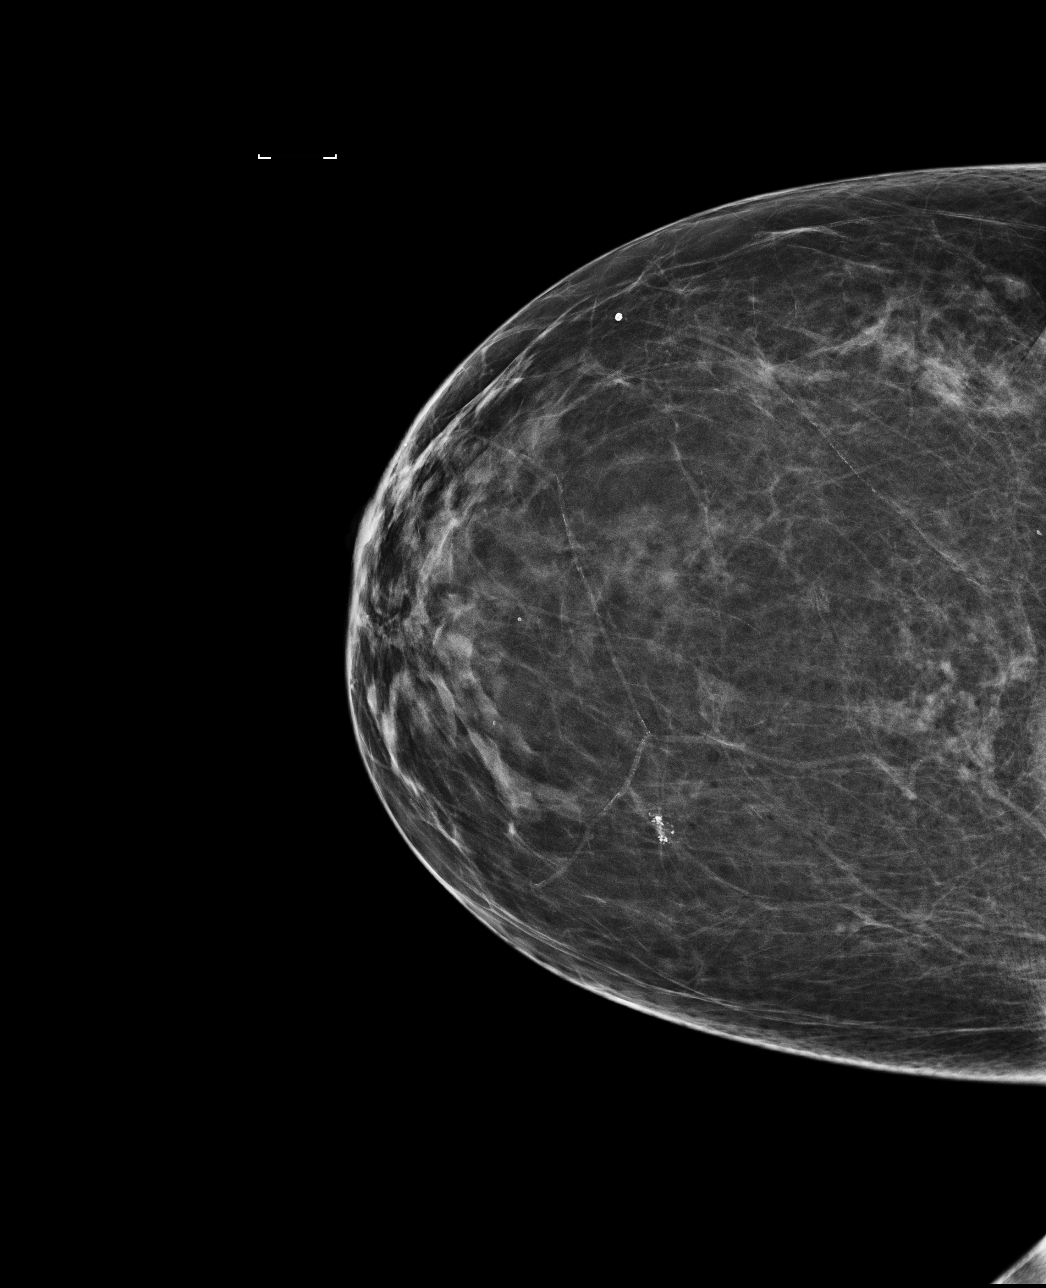

[L CC]
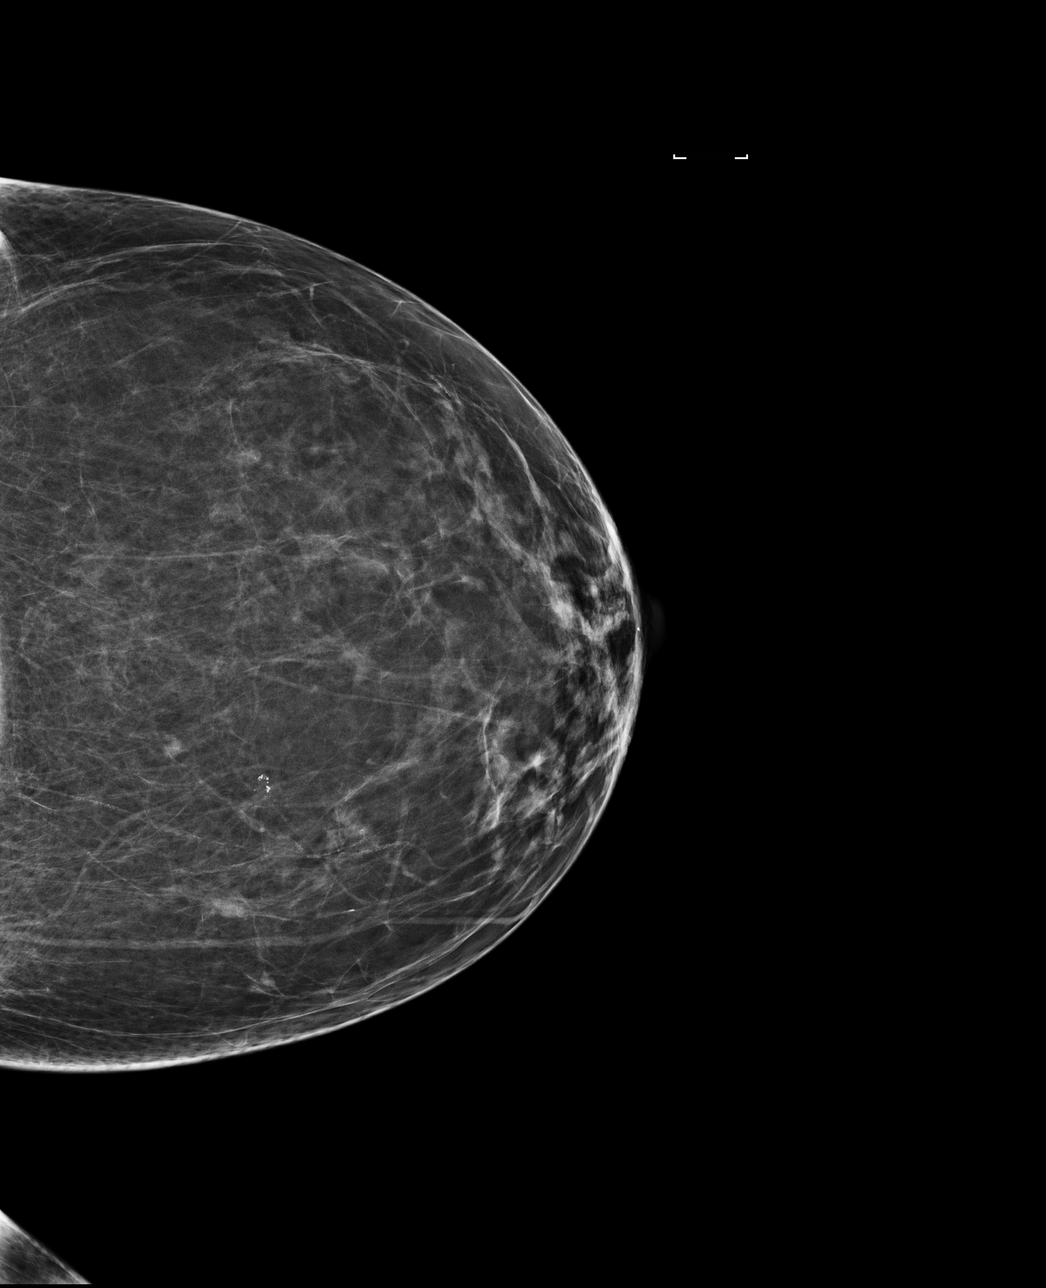

[4 of 4 positions shown; findings below may reference images not displayed]

ACR Breast Density Category b: There are scattered areas of
fibroglandular density.
FINDINGS: There are no findings suspicious for malignancy. Images were
processed with CAD.
IMPRESSION: No mammographic evidence of malignancy. A result letter of this
screening mammogram will be mailed directly to the patient.

RECOMMENDATION:
Screening mammogram in one year. (Code:AS-G-LCT)

BI-RADS CATEGORY  1: Negative.

## 2022-03-05 ENCOUNTER — Ambulatory Visit (HOSPITAL_BASED_OUTPATIENT_CLINIC_OR_DEPARTMENT_OTHER): Payer: Medicare Other | Admitting: Physical Therapy

## 2022-03-10 ENCOUNTER — Encounter (HOSPITAL_BASED_OUTPATIENT_CLINIC_OR_DEPARTMENT_OTHER): Payer: Self-pay | Admitting: Physical Therapy

## 2022-03-10 ENCOUNTER — Ambulatory Visit (HOSPITAL_BASED_OUTPATIENT_CLINIC_OR_DEPARTMENT_OTHER): Payer: Medicare Other | Admitting: Physical Therapy

## 2022-03-10 ENCOUNTER — Other Ambulatory Visit: Payer: Self-pay

## 2022-03-10 DIAGNOSIS — R252 Cramp and spasm: Secondary | ICD-10-CM

## 2022-03-10 DIAGNOSIS — M5459 Other low back pain: Secondary | ICD-10-CM | POA: Diagnosis not present

## 2022-03-10 DIAGNOSIS — R262 Difficulty in walking, not elsewhere classified: Secondary | ICD-10-CM | POA: Diagnosis not present

## 2022-03-10 DIAGNOSIS — M6281 Muscle weakness (generalized): Secondary | ICD-10-CM

## 2022-03-10 DIAGNOSIS — M542 Cervicalgia: Secondary | ICD-10-CM | POA: Diagnosis not present

## 2022-03-10 DIAGNOSIS — M545 Low back pain, unspecified: Secondary | ICD-10-CM | POA: Diagnosis not present

## 2022-03-10 NOTE — Therapy (Signed)
[facility] [address] [place], [place], [address] Phone: [phone]   Fax:  [phone]  Physical Therapy Treatment  Patient Details  Name: Dawn [name] MRN: [id number] Date of Birth: 1945-12-16 Referring Provider (PT): Dr Arlice Colt   Encounter Date: 03/10/2022   PT End of Session - 03/10/22 1023     Visit Number 39    Number of Visits 45    Date for PT Re-Evaluation 03/30/22    Authorization Type progress note done on visit 36 next to be done on 51    PT Start Time 1015    PT Stop Time 1057    PT Time Calculation (min) 42 min    Activity Tolerance Patient tolerated treatment well    Behavior During Therapy [facility] for tasks assessed/performed             Past Medical History:  Diagnosis Date   Allergy    Dry eyes    Endometrial polyp    Essential hypertension 08/25/2018   GERD (gastroesophageal reflux disease)    Heart failure ([place])    History of hiatal hernia    History of kidney stones    Hot flashes, menopausal 10/13/2011   Estradiol started    Jaundice as teenager   no problems since   Memory loss 09/21/2019     1/2 of feet numb all the time   Multiple sclerosis ([place]) dx 2001   Neuropathy    Neuropathy    bilateral feet   Osteoarthritis    Sjogren's syndrome ([place]) dx oct 2021   sore muscvles, dry mouth and eyes   Stroke ([place])    Vertigo    Vision abnormalities     Past Surgical History:  Procedure Laterality Date   ABDOMINAL HYSTERECTOMY  2002   partial   COLONOSCOPY  01/27/2022   2 day prep   colonscopy  2011   CORONARY STENT INTERVENTION N/A 07/18/2021   Procedure: CORONARY STENT INTERVENTION;  Surgeon: Jettie Booze, MD;  Location: [facility];  Service: Cardiovascular;  Laterality: N/A;   DILATATION & CURETTAGE/HYSTEROSCOPY WITH MYOSURE N/A 06/08/2020   Procedure: DILATATION & CURETTAGE/HYSTEROSCOPY/Polypectomy WITH MYOSURE;  Surgeon: Armandina Stammer, DO;   Location: [facility];  Service: Gynecology;  Laterality: N/A;   LEFT HEART CATH AND CORONARY ANGIOGRAPHY N/A 07/18/2021   Procedure: LEFT HEART CATH AND CORONARY ANGIOGRAPHY;  Surgeon: Jettie Booze, MD;  Location: [facility];  Service: Cardiovascular;  Laterality: N/A;   LUMBAR FUSION  2001   TOTAL HIP ARTHROPLASTY Right 01/17/2021   Procedure: TOTAL HIP ARTHROPLASTY ANTERIOR APPROACH;  Surgeon: Rod Can, MD;  [facility];  Service: Orthopedics;  Laterality: Right;   UPPER GI ENDOSCOPY  yrs ago    There were no vitals filed for this visit.   Subjective Assessment - 03/10/22 1053     Subjective The patient reports her back pain has come back. She reports a few days ago, her shot work off. She is pain going down into bilateral gluteals    Pertinent History MS    How long can you stand comfortably? Hurts upon standing    How long can you walk comfortably? Limited community ambulation    Diagnostic tests Nothing recent    Patient Stated Goals to have less pain with ambualtion    Currently in Pain? Yes    Pain Score 6     Pain Location Hip    Pain Orientation Right;Left  Pain Descriptors / Indicators Aching    Pain Type Chronic pain    Pain Onset More than a month ago    Pain Frequency Intermittent    Aggravating Factors  pain walking    Pain Relieving Factors rest    Effect of Pain on Daily Activities difficulty with community ambaultion    Multiple Pain Sites No                    LTR x20 Bilateral alternating UE 2x10 1lbs   Supine wand flexion x20 3lb weight  Supine march x20   LAQ x20 Bilateral clamshells 2x15 green   Manual: skilled palpation of trigger points; trogger point release to low back and gluteals   Trigger Point Dry-Needling  Treatment instructions: Expect mild to moderate muscle soreness. S/S of pneumothorax if dry needled over a lung field, and to seek immediate medical attention should they occur.  Patient verbalized understanding of these instructions and education.  Patient Consent Given: Yes Education handout provided: Yes Muscles treated: bilateral gluteals 2 spots with a .30x75 needle in bilateral glut med  Electrical stimulation performed: No Parameters: N/A Treatment response/outcome: great twitch in all spots.                          PT Short Term Goals - 02/02/22 2042       PT SHORT TERM GOAL #1   Title Patient will report  <3/10 pain in the back and right hip    Baseline revised for hip and back    Time 4    Period Weeks    Status Revised    Target Date 03/02/22      PT SHORT TERM GOAL #2   Title Patient will ambaulte 1200 with no significant spike in B/P or HR    Baseline 840 today    Time 4    Period Weeks    Status On-going    Target Date 01/24/22      PT SHORT TERM GOAL #3   Title Patient increase right hip flexion and abducion strength by 5 lbs    Baseline more limited since recent onset of pain    Time 4    Period Weeks    Status On-going    Target Date 03/02/22      PT SHORT TERM GOAL #4   Title Patient will be indepdnent with basic endruance and right hip strengthening program    Baseline working on base program    Time 4    Period Weeks    Status Achieved    Target Date 03/02/22               PT Long Term Goals - 02/02/22 2046       PT LONG TERM GOAL #1   Title Patient will return to [facility] with a full program for her hip/ lower back/ and endurance    Baseline was progressing towards returning until recent flair up    Time 8    Period Weeks    Status On-going      PT LONG TERM GOAL #2   Title Patient will ambualte 1200' the 6 min walk test in order to improve community ambualtion    Baseline 840    Time 8    Period Weeks    Status On-going    Target Date 03/30/22      PT LONG TERM GOAL #3   Title  Patient will stand for 30 min without a significant increase in pain in order to perfrom ADL's     Baseline >15 min    Time 8    Period Weeks    Status On-going    Target Date 03/30/22                   Plan - 03/10/22 1059     Clinical Impression Statement The patient reported an improvement in pain after needling. She had trigger points in her lower back and gluteals on both sides. Therapy perfroemd manual therapy to these areas. We worked on core strengthening in combo with UE and LE movement. She tolerated well. We will continue to work on strengthening in combination with manual tand needling.    Personal Factors and Comorbidities Comorbidity 1;Comorbidity 2    Comorbidities MS, OA    Examination-Activity Limitations Bed Mobility    Examination-Participation Restrictions Meal Prep;Cleaning;Community Activity;Laundry;Shop;Occupation    Stability/Clinical Decision Making Stable/Uncomplicated    Clinical Decision Making Low    Rehab Potential Good    PT Frequency 1x / week    PT Duration 8 weeks    PT Treatment/Interventions ADLs/Self Care Home Management;Electrical Stimulation;Cryotherapy;Iontophoresis '4mg'$ /ml Dexamethasone;Moist Heat;Traction;DME Instruction;Neuromuscular re-education;Patient/family education;Manual techniques;Passive range of motion;Taping;Therapeutic activities;Therapeutic exercise;Balance training;Gait training;Stair training;Functional mobility training;Dry needling;Ultrasound    PT Next Visit Plan add in endurance exercises; review tolerance to interval training    PT Home Exercise Plan continue with progressiv estrengthening porgram    Consulted and Agree with Plan of Care Patient             Patient will benefit from skilled therapeutic intervention in order to improve the following deficits and impairments:  Abnormal gait, Difficulty walking, Decreased range of motion, Decreased mobility, Decreased strength, Postural dysfunction, Pain, Increased muscle spasms, Decreased activity tolerance, Increased fascial restricitons  Visit  Diagnosis: Difficulty in walking, not elsewhere classified  Cramp and spasm  Other low back pain  Muscle weakness (generalized)     Problem List Patient Active Problem List   Diagnosis Date Noted   Positive colorectal cancer screening using Cologuard test 11/14/2021   Coronary artery disease    Acute diastolic CHF (congestive heart failure) ([place]) 06/15/2021   Demand ischemia ([place]) 06/15/2021   Chest pain 06/14/2021   Elevated troponin 06/14/2021   Statin intolerance 04/19/2021   Osteoarthritis of right hip 01/17/2021   Status post THR (total hip replacement) 01/17/2021   Chronic bilateral low back pain without sciatica 10/02/2020   Chronic pain syndrome 06/20/2020   Post laminectomy syndrome 06/20/2020   Sjogren's disease ([place]) 05/23/2020   Primary osteoarthritis of both hands 05/23/2020   Primary osteoarthritis of both feet 05/23/2020   Sacroiliitis, not elsewhere classified ([place]) 12/22/2019   Other secondary scoliosis, lumbar region 12/22/2019   Spondylosis without myelopathy or radiculopathy, lumbar region 12/22/2019   Cervical radiculopathy 10/18/2019   Numbness 09/21/2019   Bilateral carpal tunnel syndrome 09/21/2019   High risk medication use 09/21/2019   Memory loss 09/21/2019   Essential hypertension 08/25/2018   Abnormal SPEP 02/19/2018   Hand pain 02/20/2017   Multiple joint pain 02/18/2017   Right elbow pain 12/09/2016   Disturbed cognition 06/25/2016   Trochanteric bursitis of left hip 02/18/2016   Sciatica, right side 10/30/2015   Bilateral arm pain 10/10/2015   Trochanteric bursitis of both hips 08/04/2014   Chronic fatigue 08/04/2014   Urinary frequency 08/04/2014   Abnormality of gait 08/04/2014   Unspecified visual disturbance 01/31/2013  Transient vision disturbance 01/31/2013   Routine general medical examination at a health care facility 11/04/2011   Colon polyps 11/03/2011   Hot flashes, menopausal 10/13/2011   POSTHERPETIC NEURALGIA  10/03/2009   DISPLCMT LUMBAR INTERVERT DISC W/O MYELOPATHY 09/05/2009   RENAL CALCULUS, RECURRENT 09/04/2009   Hyperlipidemia 08/31/2009   Multiple sclerosis ([place]) 08/31/2009   ALLERGIC RHINITIS 08/31/2009    Carney Living, PT 03/10/2022, 11:33 AM  [facility] [place], [place], [address] Phone: [phone]   Fax:  [phone]  Name: Dawn [name] MRN: [id number] Date of Birth: [birth year]

## 2022-03-12 ENCOUNTER — Encounter (HOSPITAL_BASED_OUTPATIENT_CLINIC_OR_DEPARTMENT_OTHER): Payer: Medicare Other | Admitting: Physical Therapy

## 2022-03-17 ENCOUNTER — Other Ambulatory Visit: Payer: Self-pay | Admitting: Internal Medicine

## 2022-03-19 ENCOUNTER — Ambulatory Visit (HOSPITAL_BASED_OUTPATIENT_CLINIC_OR_DEPARTMENT_OTHER): Payer: Medicare Other | Admitting: Physical Therapy

## 2022-03-19 ENCOUNTER — Encounter (HOSPITAL_BASED_OUTPATIENT_CLINIC_OR_DEPARTMENT_OTHER): Payer: Self-pay | Admitting: Physical Therapy

## 2022-03-19 DIAGNOSIS — M5459 Other low back pain: Secondary | ICD-10-CM | POA: Diagnosis not present

## 2022-03-19 DIAGNOSIS — R252 Cramp and spasm: Secondary | ICD-10-CM

## 2022-03-19 DIAGNOSIS — M545 Low back pain, unspecified: Secondary | ICD-10-CM

## 2022-03-19 DIAGNOSIS — R262 Difficulty in walking, not elsewhere classified: Secondary | ICD-10-CM

## 2022-03-19 DIAGNOSIS — M542 Cervicalgia: Secondary | ICD-10-CM

## 2022-03-19 DIAGNOSIS — M6281 Muscle weakness (generalized): Secondary | ICD-10-CM | POA: Diagnosis not present

## 2022-03-19 DIAGNOSIS — G8929 Other chronic pain: Secondary | ICD-10-CM

## 2022-03-19 NOTE — Therapy (Signed)
[facility] [address] [place], [place], [address] Phone: [phone]   Fax:  [phone]  Physical Therapy Treatment  Patient Details  Name: Dawn [name] MRN: [id number] Date of Birth: 07-18-1946 Referring Provider (PT): Dr Arlice Colt   Encounter Date: 03/19/2022   PT End of Session - 03/19/22 1203     Visit Number 40    Number of Visits 65    Date for PT Re-Evaluation 03/30/22    Authorization Type progress note done on visit 36 next to be done on 68    PT Start Time 1100    PT Stop Time 1143    PT Time Calculation (min) 43 min    Activity Tolerance Patient tolerated treatment well    Behavior During Therapy [facility] for tasks assessed/performed             Past Medical History:  Diagnosis Date   Allergy    Dry eyes    Endometrial polyp    Essential hypertension 08/25/2018   GERD (gastroesophageal reflux disease)    Heart failure ([place])    History of hiatal hernia    History of kidney stones    Hot flashes, menopausal 10/13/2011   Estradiol started    Jaundice as teenager   no problems since   Memory loss 09/21/2019     1/2 of feet numb all the time   Multiple sclerosis ([place]) dx 2001   Neuropathy    Neuropathy    bilateral feet   Osteoarthritis    Sjogren's syndrome ([place]) dx oct 2021   sore muscvles, dry mouth and eyes   Stroke ([place])    Vertigo    Vision abnormalities     Past Surgical History:  Procedure Laterality Date   ABDOMINAL HYSTERECTOMY  2002   partial   COLONOSCOPY  01/27/2022   2 day prep   colonscopy  2011   CORONARY STENT INTERVENTION N/A 07/18/2021   Procedure: CORONARY STENT INTERVENTION;  Surgeon: Jettie Booze, MD;  Location: [facility];  Service: Cardiovascular;  Laterality: N/A;   DILATATION & CURETTAGE/HYSTEROSCOPY WITH MYOSURE N/A 06/08/2020   Procedure: DILATATION & CURETTAGE/HYSTEROSCOPY/Polypectomy WITH MYOSURE;  Surgeon: Armandina Stammer, DO;   Location: [facility];  Service: Gynecology;  Laterality: N/A;   LEFT HEART CATH AND CORONARY ANGIOGRAPHY N/A 07/18/2021   Procedure: LEFT HEART CATH AND CORONARY ANGIOGRAPHY;  Surgeon: Jettie Booze, MD;  Location: [facility];  Service: Cardiovascular;  Laterality: N/A;   LUMBAR FUSION  2001   TOTAL HIP ARTHROPLASTY Right 01/17/2021   Procedure: TOTAL HIP ARTHROPLASTY ANTERIOR APPROACH;  Surgeon: Rod Can, MD;  [facility]: [facility];  Service: Orthopedics;  Laterality: Right;   UPPER GI ENDOSCOPY  yrs ago    There were no vitals filed for this visit.   Subjective Assessment - 03/19/22 1337     Subjective Patient continues to have pain in her back., it has been increasing over the past few weeks    How long can you stand comfortably? Hurts upon standing    How long can you walk comfortably? Limited community ambulation    Diagnostic tests Nothing recent    Currently in Pain? Yes    Pain Score 5     Pain Location Back    Pain Orientation Right    Pain Descriptors / Indicators Aching    Pain Type Chronic pain    Pain Onset More than a month ago  Pain Frequency Intermittent    Aggravating Factors  pain walking and standing    Pain Relieving Factors rest    Effect of Pain on Daily Activities difficulty with ambualtion    Multiple Pain Sites No                      LTR x20 Double Knee to chest with ball x20  Heel press into the ball x20  Ball rotation x20    Supine wand flexion x20 3lb weight  Supine march x20    LAQ x20   Standing heel raise x20  Standing march x20    Manual: trigger point release o right lower back and gluteal                       PT Education - 03/19/22 1338     Education Details reviewed supine ball exercises    Person(s) Educated Patient    Methods Explanation;Demonstration;Verbal cues;Tactile cues    Comprehension Verbalized understanding;Returned demonstration;Verbal cues  required;Tactile cues required              PT Short Term Goals - 02/02/22 2042       PT SHORT TERM GOAL #1   Title Patient will report  <3/10 pain in the back and right hip    Baseline revised for hip and back    Time 4    Period Weeks    Status Revised    Target Date 03/02/22      PT SHORT TERM GOAL #2   Title Patient will ambaulte 1200 with no significant spike in B/P or HR    Baseline 840 today    Time 4    Period Weeks    Status On-going    Target Date 01/24/22      PT SHORT TERM GOAL #3   Title Patient increase right hip flexion and abducion strength by 5 lbs    Baseline more limited since recent onset of pain    Time 4    Period Weeks    Status On-going    Target Date 03/02/22      PT SHORT TERM GOAL #4   Title Patient will be indepdnent with basic endruance and right hip strengthening program    Baseline working on base program    Time 4    Period Weeks    Status Achieved    Target Date 03/02/22               PT Long Term Goals - 02/02/22 2046       PT LONG TERM GOAL #1   Title Patient will return to [facility] with a full program for her hip/ lower back/ and endurance    Baseline was progressing towards returning until recent flair up    Time 8    Period Weeks    Status On-going      PT LONG TERM GOAL #2   Title Patient will ambualte 1200' the 6 min walk test in order to improve community ambualtion    Baseline 840    Time 8    Period Weeks    Status On-going    Target Date 03/30/22      PT LONG TERM GOAL #3   Title Patient will stand for 30 min without a significant increase in pain in order to perfrom ADL's    Baseline >15 min    Time 8    Period Weeks  Status On-going    Target Date 03/30/22                   Plan - 03/19/22 1338     Clinical Impression Statement The patient had a lrge trigger point in her lower lumbar spine and another in her gluteal. We worked on manual release with improved pain noted. We  continue to work on core and hip strengthening. She reported a reduction in pain following her visit. she will see the MD tomorrow.    Personal Factors and Comorbidities Comorbidity 1;Comorbidity 2    Comorbidities MS, OA    Examination-Activity Limitations Bed Mobility    Examination-Participation Restrictions Meal Prep;Cleaning;Community Activity;Laundry;Shop;Occupation    Stability/Clinical Decision Making Stable/Uncomplicated    Clinical Decision Making Low    Rehab Potential Good    PT Frequency 1x / week    PT Duration 8 weeks    PT Treatment/Interventions ADLs/Self Care Home Management;Electrical Stimulation;Cryotherapy;Iontophoresis '4mg'$ /ml Dexamethasone;Moist Heat;Traction;DME Instruction;Neuromuscular re-education;Patient/family education;Manual techniques;Passive range of motion;Taping;Therapeutic activities;Therapeutic exercise;Balance training;Gait training;Stair training;Functional mobility training;Dry needling;Ultrasound    PT Next Visit Plan add in endurance exercises; review tolerance to interval training    PT Home Exercise Plan continue with progressiv estrengthening porgram    Consulted and Agree with Plan of Care Patient             Patient will benefit from skilled therapeutic intervention in order to improve the following deficits and impairments:  Abnormal gait, Difficulty walking, Decreased range of motion, Decreased mobility, Decreased strength, Postural dysfunction, Pain, Increased muscle spasms, Decreased activity tolerance, Increased fascial restricitons  Visit Diagnosis: Difficulty in walking, not elsewhere classified  Cramp and spasm  Other low back pain  Muscle weakness (generalized)  Cervicalgia  Chronic bilateral low back pain without sciatica     Problem List Patient Active Problem List   Diagnosis Date Noted   Positive colorectal cancer screening using Cologuard test 11/14/2021   Coronary artery disease    Acute diastolic CHF (congestive  heart failure) ([place]) 06/15/2021   Demand ischemia ([place]) 06/15/2021   Chest pain 06/14/2021   Elevated troponin 06/14/2021   Statin intolerance 04/19/2021   Osteoarthritis of right hip 01/17/2021   Status post THR (total hip replacement) 01/17/2021   Chronic bilateral low back pain without sciatica 10/02/2020   Chronic pain syndrome 06/20/2020   Post laminectomy syndrome 06/20/2020   Sjogren's disease ([place]) 05/23/2020   Primary osteoarthritis of both hands 05/23/2020   Primary osteoarthritis of both feet 05/23/2020   Sacroiliitis, not elsewhere classified ([place]) 12/22/2019   Other secondary scoliosis, lumbar region 12/22/2019   Spondylosis without myelopathy or radiculopathy, lumbar region 12/22/2019   Cervical radiculopathy 10/18/2019   Numbness 09/21/2019   Bilateral carpal tunnel syndrome 09/21/2019   High risk medication use 09/21/2019   Memory loss 09/21/2019   Essential hypertension 08/25/2018   Abnormal SPEP 02/19/2018   Hand pain 02/20/2017   Multiple joint pain 02/18/2017   Right elbow pain 12/09/2016   Disturbed cognition 06/25/2016   Trochanteric bursitis of left hip 02/18/2016   Sciatica, right side 10/30/2015   Bilateral arm pain 10/10/2015   Trochanteric bursitis of both hips 08/04/2014   Chronic fatigue 08/04/2014   Urinary frequency 08/04/2014   Abnormality of gait 08/04/2014   Unspecified visual disturbance 01/31/2013   Transient vision disturbance 01/31/2013   Routine general medical examination at a health care facility 11/04/2011   Colon polyps 11/03/2011   Hot flashes, menopausal 10/13/2011   POSTHERPETIC NEURALGIA 10/03/2009  DISPLCMT LUMBAR INTERVERT DISC W/O MYELOPATHY 09/05/2009   RENAL CALCULUS, RECURRENT 09/04/2009   Hyperlipidemia 08/31/2009   Multiple sclerosis ([place]) 08/31/2009   ALLERGIC RHINITIS 08/31/2009    Carney Living, PT 03/19/2022, 1:40 PM  [facility] [place], [place], [address] Phone: [phone]   Fax:  [phone]  Name: EMALYNN CLEWIS MRN: [id number] Date of Birth: 12-Dec-1945

## 2022-03-20 DIAGNOSIS — M47816 Spondylosis without myelopathy or radiculopathy, lumbar region: Secondary | ICD-10-CM | POA: Diagnosis not present

## 2022-03-21 ENCOUNTER — Ambulatory Visit (HOSPITAL_BASED_OUTPATIENT_CLINIC_OR_DEPARTMENT_OTHER): Payer: Medicare Other | Admitting: Physical Therapy

## 2022-03-26 ENCOUNTER — Encounter (HOSPITAL_BASED_OUTPATIENT_CLINIC_OR_DEPARTMENT_OTHER): Payer: Medicare Other | Admitting: Physical Therapy

## 2022-03-28 ENCOUNTER — Encounter (HOSPITAL_BASED_OUTPATIENT_CLINIC_OR_DEPARTMENT_OTHER): Payer: Self-pay | Admitting: Physical Therapy

## 2022-03-28 ENCOUNTER — Ambulatory Visit (HOSPITAL_BASED_OUTPATIENT_CLINIC_OR_DEPARTMENT_OTHER): Payer: Medicare Other | Attending: Neurology | Admitting: Physical Therapy

## 2022-03-28 DIAGNOSIS — G8929 Other chronic pain: Secondary | ICD-10-CM | POA: Insufficient documentation

## 2022-03-28 DIAGNOSIS — M6281 Muscle weakness (generalized): Secondary | ICD-10-CM | POA: Insufficient documentation

## 2022-03-28 DIAGNOSIS — M5459 Other low back pain: Secondary | ICD-10-CM | POA: Diagnosis not present

## 2022-03-28 DIAGNOSIS — M545 Low back pain, unspecified: Secondary | ICD-10-CM | POA: Insufficient documentation

## 2022-03-28 DIAGNOSIS — R252 Cramp and spasm: Secondary | ICD-10-CM | POA: Diagnosis not present

## 2022-03-28 DIAGNOSIS — R262 Difficulty in walking, not elsewhere classified: Secondary | ICD-10-CM | POA: Insufficient documentation

## 2022-03-28 DIAGNOSIS — M542 Cervicalgia: Secondary | ICD-10-CM | POA: Diagnosis not present

## 2022-03-28 NOTE — Therapy (Signed)
[facility] [address] [place], [place], [address] Phone: [phone]   Fax:  [phone]  Physical Therapy Treatment  Patient Details  Name: Dawn [name] MRN: [id number] Date of Birth: 04/07/1946 Referring Provider (PT): Dr Arlice Colt   Encounter Date: 03/28/2022   PT End of Session - 03/28/22 1107     Visit Number 41    Number of Visits 37    Date for PT Re-Evaluation 03/30/22    Authorization Type progress note done on visit 36 next to be done on 62    PT Start Time 1100    PT Stop Time 1140    PT Time Calculation (min) 40 min    Activity Tolerance Patient tolerated treatment well    Behavior During Therapy [facility] for tasks assessed/performed             Past Medical History:  Diagnosis Date   Allergy    Dry eyes    Endometrial polyp    Essential hypertension 08/25/2018   GERD (gastroesophageal reflux disease)    Heart failure ([place])    History of hiatal hernia    History of kidney stones    Hot flashes, menopausal 10/13/2011   Estradiol started    Jaundice as teenager   no problems since   Memory loss 09/21/2019     1/2 of feet numb all the time   Multiple sclerosis ([place]) dx 2001   Neuropathy    Neuropathy    bilateral feet   Osteoarthritis    Sjogren's syndrome ([place]) dx oct 2021   sore muscvles, dry mouth and eyes   Stroke ([place])    Vertigo    Vision abnormalities     Past Surgical History:  Procedure Laterality Date   ABDOMINAL HYSTERECTOMY  2002   partial   COLONOSCOPY  01/27/2022   2 day prep   colonscopy  2011   CORONARY STENT INTERVENTION N/A 07/18/2021   Procedure: CORONARY STENT INTERVENTION;  Surgeon: Jettie Booze, MD;  Location: [facility];  Service: Cardiovascular;  Laterality: N/A;   DILATATION & CURETTAGE/HYSTEROSCOPY WITH MYOSURE N/A 06/08/2020   Procedure: DILATATION & CURETTAGE/HYSTEROSCOPY/Polypectomy WITH MYOSURE;  Surgeon: Armandina Stammer, DO;   Location: [facility];  Service: Gynecology;  Laterality: N/A;   LEFT HEART CATH AND CORONARY ANGIOGRAPHY N/A 07/18/2021   Procedure: LEFT HEART CATH AND CORONARY ANGIOGRAPHY;  Surgeon: Jettie Booze, MD;  Location: [facility];  Service: Cardiovascular;  Laterality: N/A;   LUMBAR FUSION  2001   TOTAL HIP ARTHROPLASTY Right 01/17/2021   Procedure: TOTAL HIP ARTHROPLASTY ANTERIOR APPROACH;  Surgeon: Rod Can, MD;  [facility];  Service: Orthopedics;  Laterality: Right;   UPPER GI ENDOSCOPY  yrs ago    There were no vitals filed for this visit.   Subjective Assessment - 03/28/22 1105     Subjective The patient reports that the manual therapy helped last week until yesterday and now her back pain is back and has increased    How long can you stand comfortably? Hurts upon standing    How long can you walk comfortably? Limited community ambulation    Diagnostic tests Nothing recent    Currently in Pain? Yes    Pain Score 4     Pain Location Back    Pain Orientation Right    Pain Descriptors / Indicators Aching    Pain Type Chronic pain    Pain Onset More than a  month ago    Pain Frequency Intermittent    Multiple Pain Sites No    Pain Score 6    Pain Location Back    Pain Orientation Right    Pain Descriptors / Indicators Aching    Pain Type Chronic pain    Pain Onset More than a month ago                  LTR x20 Double Knee to chest with ball x20  Heel press into the ball x20  Ball rotation x20    Supine wand flexion x20 3lb weight  Supine march x20    LAQ x20   Manual: trigger point release o right lower back and gluteal                         PT Short Term Goals - 02/02/22 2042       PT SHORT TERM GOAL #1   Title Patient will report  <3/10 pain in the back and right hip    Baseline revised for hip and back    Time 4    Period Weeks    Status Revised    Target Date 03/02/22      PT SHORT  TERM GOAL #2   Title Patient will ambaulte 1200 with no significant spike in B/P or HR    Baseline 840 today    Time 4    Period Weeks    Status On-going    Target Date 01/24/22      PT SHORT TERM GOAL #3   Title Patient increase right hip flexion and abducion strength by 5 lbs    Baseline more limited since recent onset of pain    Time 4    Period Weeks    Status On-going    Target Date 03/02/22      PT SHORT TERM GOAL #4   Title Patient will be indepdnent with basic endruance and right hip strengthening program    Baseline working on base program    Time 4    Period Weeks    Status Achieved    Target Date 03/02/22               PT Long Term Goals - 02/02/22 2046       PT LONG TERM GOAL #1   Title Patient will return to [facility] with a full program for her hip/ lower back/ and endurance    Baseline was progressing towards returning until recent flair up    Time 8    Period Weeks    Status On-going      PT LONG TERM GOAL #2   Title Patient will ambualte 1200' the 6 min walk test in order to improve community ambualtion    Baseline 840    Time 8    Period Weeks    Status On-going    Target Date 03/30/22      PT LONG TERM GOAL #3   Title Patient will stand for 30 min without a significant increase in pain in order to perfrom ADL's    Baseline >15 min    Time 8    Period Weeks    Status On-going    Target Date 03/30/22                   Plan - 03/28/22 1212     Clinical Impression Statement The patient had a trigger point  in her gluteal and lower lumbar spine but it was not as big as the last visit. We will do a re-assessment next visit. She will have a procedure on her back in a week. She will likley reuqire PT after that. She tolerated ther-ex well. Therapy will continue to progress as tolerated.    Personal Factors and Comorbidities Comorbidity 1;Comorbidity 2    Comorbidities MS, OA    Examination-Activity Limitations Bed Mobility     Examination-Participation Restrictions Meal Prep;Cleaning;Community Activity;Laundry;Shop;Occupation    Stability/Clinical Decision Making Stable/Uncomplicated    Clinical Decision Making Low    Rehab Potential Good    PT Frequency 1x / week    PT Duration 8 weeks    PT Treatment/Interventions ADLs/Self Care Home Management;Electrical Stimulation;Cryotherapy;Iontophoresis '4mg'$ /ml Dexamethasone;Moist Heat;Traction;DME Instruction;Neuromuscular re-education;Patient/family education;Manual techniques;Passive range of motion;Taping;Therapeutic activities;Therapeutic exercise;Balance training;Gait training;Stair training;Functional mobility training;Dry needling;Ultrasound    PT Next Visit Plan add in endurance exercises; review tolerance to interval training    PT Home Exercise Plan continue with progressiv estrengthening porgram    Consulted and Agree with Plan of Care Patient             Patient will benefit from skilled therapeutic intervention in order to improve the following deficits and impairments:  Abnormal gait, Difficulty walking, Decreased range of motion, Decreased mobility, Decreased strength, Postural dysfunction, Pain, Increased muscle spasms, Decreased activity tolerance, Increased fascial restricitons  Visit Diagnosis: Difficulty in walking, not elsewhere classified  Cramp and spasm  Other low back pain  Muscle weakness (generalized)  Cervicalgia  Chronic bilateral low back pain without sciatica     Problem List Patient Active Problem List   Diagnosis Date Noted   Positive colorectal cancer screening using Cologuard test 11/14/2021   Coronary artery disease    Acute diastolic CHF (congestive heart failure) ([place]) 06/15/2021   Demand ischemia ([place]) 06/15/2021   Chest pain 06/14/2021   Elevated troponin 06/14/2021   Statin intolerance 04/19/2021   Osteoarthritis of right hip 01/17/2021   Status post THR (total hip replacement) 01/17/2021   Chronic bilateral  low back pain without sciatica 10/02/2020   Chronic pain syndrome 06/20/2020   Post laminectomy syndrome 06/20/2020   Sjogren's disease ([place]) 05/23/2020   Primary osteoarthritis of both hands 05/23/2020   Primary osteoarthritis of both feet 05/23/2020   Sacroiliitis, not elsewhere classified ([place]) 12/22/2019   Other secondary scoliosis, lumbar region 12/22/2019   Spondylosis without myelopathy or radiculopathy, lumbar region 12/22/2019   Cervical radiculopathy 10/18/2019   Numbness 09/21/2019   Bilateral carpal tunnel syndrome 09/21/2019   High risk medication use 09/21/2019   Memory loss 09/21/2019   Essential hypertension 08/25/2018   Abnormal SPEP 02/19/2018   Hand pain 02/20/2017   Multiple joint pain 02/18/2017   Right elbow pain 12/09/2016   Disturbed cognition 06/25/2016   Trochanteric bursitis of left hip 02/18/2016   Sciatica, right side 10/30/2015   Bilateral arm pain 10/10/2015   Trochanteric bursitis of both hips 08/04/2014   Chronic fatigue 08/04/2014   Urinary frequency 08/04/2014   Abnormality of gait 08/04/2014   Unspecified visual disturbance 01/31/2013   Transient vision disturbance 01/31/2013   Routine general medical examination at a health care facility 11/04/2011   Colon polyps 11/03/2011   Hot flashes, menopausal 10/13/2011   POSTHERPETIC NEURALGIA 10/03/2009   DISPLCMT LUMBAR INTERVERT DISC W/O MYELOPATHY 09/05/2009   RENAL CALCULUS, RECURRENT 09/04/2009   Hyperlipidemia 08/31/2009   Multiple sclerosis ([place]) 08/31/2009   ALLERGIC RHINITIS 08/31/2009    [place] Congress  Kayleen Memos, PT 03/28/2022, 12:27 PM  [facility] [address] [place], [place], [address] Phone: [phone]   Fax:  [phone]  Name: Dawn [name] MRN: [id number] Date of Birth: 16-Feb-1946

## 2022-04-01 ENCOUNTER — Ambulatory Visit: Payer: Medicare Other | Attending: Internal Medicine | Admitting: Internal Medicine

## 2022-04-01 ENCOUNTER — Encounter: Payer: Self-pay | Admitting: Internal Medicine

## 2022-04-01 VITALS — BP 124/66 | HR 71 | Ht 62.0 in | Wt 167.2 lb

## 2022-04-01 DIAGNOSIS — I2510 Atherosclerotic heart disease of native coronary artery without angina pectoris: Secondary | ICD-10-CM | POA: Diagnosis not present

## 2022-04-01 NOTE — Patient Instructions (Signed)
Medication Instructions:  STOP PLAVIX  *If you need a refill on your cardiac medications before your next appointment, please call your pharmacy*  Lab Work: None Ordered At This Time.  If you have labs (blood work) drawn today and your tests are completely normal, you will receive your results only by: [place] (if you have MyChart) OR A paper copy in the mail If you have any lab test that is abnormal or we need to change your treatment, we will call you to review the results.  Testing/Procedures: None Ordered At This Time.   Follow-Up: At [facility], you and your health needs are our priority.  As part of our continuing mission to provide you with exceptional heart care, we have created designated Provider Care Teams.  These Care Teams include your primary Cardiologist (physician) and Advanced Practice Providers (APPs -  Physician Assistants and Nurse Practitioners) who all work together to provide you with the care you need, when you need it.  Your next appointment:   1 year(s)  The format for your next appointment:   In Person  Provider:   Janina Mayo, MD

## 2022-04-01 NOTE — Progress Notes (Addendum)
Cardiology Office Note:    Date:  04/01/2022   ID:  Dawn [name], DOB 01/19/1946, MRN [id number]  PCP:  Sandrea Hughs, NP   [facility] Providers Cardiologist:  Janina Mayo, MD     Referring MD: Sandrea Hughs, NP   No chief complaint on file. Hospital Follow Up  History of Present Illness:   Initial HPI Dawn [name] is a 75 y.o. female with a hx  below, CVA on aspirin, HLD,  MS, hx referral for hospitalization with NSTEMI, hypertensive emergency  She got tight around her chest. She was short of breath. She had arm cramping. Her symptoms were abrupt. It was later in the day and she was doing activity. She has MS and felt an "MS hug" which can be a tight feeling in her chest. Prior to that she she had no orthopnea, PND, or LE edema.  She reported her blood pressure was high and it was SBP 210 when she checked it. She went to urgent care. She went to the ED. In the ED she reported above. She had EKG with no ischemic changes. Her troponin was below. She had systolic Bps [id number].  She and an echo with normal LV function, mild LVH, no valve dx. RA pressure of 3. Don't see a BNP. Her chest xray was unremarkable. She was hypertensive and her coreg was increased and hydralazine was added. Admission sounds like hypertensive emergency. She was thought to have diastolic CHF with "new onset grade I diastolic dysfunction".  06/15/2021 Trop 182-->322->281->365->387->322  EKG 06/18/2021-NSR   Today, she feels light headed and tired. She hasn't had pain. When she does activity she feels palpitations. No chest pressure with activity. No shortness of breath with activity. No hospital admission for heart failure in the past. She smoked for 11 years and quit in 1987. No stress test and no LHC.  Her father died of MI at 66. Her brother died of heart attack at 51. Sister and grandmother had rheumatoid arthritis. She was diagnosed with MS in 2001 and it is progressive.  She was on zetia but  stopped b/c of muscle aches. She was on a statin years ago. She had full body cramping.   06/15/2021 LDL 142 HDL 44 TC 213 A1c 5.4 Crt 0.7 TSH 4.4   Interim Hx 08/02/2021 Dawn [name] underwent CTA  that showed a significant functional lesion in the RCA. She underwent LHC that showed prox to mid 99% RCA lesion with L-R collaterals. She underwent successful PCI. She was started on aspirin and brilintia. She feels tired. She did feel some shortness of breath after starting the brillinta. Her chest tightness has resolved. Her right radial site is healed. She feels some light headedness but HR and BP are fine. She is on the [place] diet.  Interim Hx 09/27/2021 Nothing has changed. She said there was an appointment made for today, plan was 6 month FU. She is out of brillinta with only three day supply. Still has som SOB with it. Her symptoms are not like they were prior to her PCI.  Brillinta cost is high and she would like an alternative  Interim 04/01/2022 Continues plavix no bleeding. No DOE and chest pressure. Her physical therapy is going well.   Cardiology Studies TTE 06/15/2021 1. Left ventricular ejection fraction, by estimation, is 60 to 65%. The  left ventricle has normal function. The left ventricle has no regional  wall motion abnormalities. There is mild left ventricular hypertrophy.  Left  ventricular diastolic parameters  are consistent with Grade I diastolic dysfunction (impaired relaxation).   2. Right ventricular systolic function is normal. The right ventricular  size is normal. Tricuspid regurgitation signal is inadequate for assessing  PA pressure.   3. The mitral valve is grossly normal. Mild mitral valve regurgitation.  No evidence of mitral stenosis.   4. The aortic valve is grossly normal. There is mild calcification of the  aortic valve. Aortic valve regurgitation is not visualized. No aortic  stenosis is present.   5. The inferior vena cava is normal in size  with greater than 50%  respiratory variability, suggesting right atrial pressure of 3 mmHg.   LHC 06/28/2021    Mid Cx lesion is 25% stenosed.   Prox LAD to Mid LAD lesion is 25% stenosed.   Prox RCA to Mid RCA lesion is 99% stenosed.  Left-to-right collaterals.   A drug-eluting stent was successfully placed using a STENT ONYX FRONTIER 3.0X30, postdilated to 3.5 mm.   Post intervention, there is a 0% residual stenosis.   The left ventricular systolic function is normal.   LV end diastolic pressure is normal.   The left ventricular ejection fraction is 55-65% by visual estimate.   There is no aortic valve stenosis.  Past Medical History:  Diagnosis Date   Allergy    Dry eyes    Endometrial polyp    Essential hypertension 08/25/2018   GERD (gastroesophageal reflux disease)    Heart failure (HCC)    History of hiatal hernia    History of kidney stones    Hot flashes, menopausal 10/13/2011   Estradiol started    Jaundice as teenager   no problems since   Memory loss 09/21/2019     1/2 of feet numb all the time   Multiple sclerosis ([place]) dx 2001   Neuropathy    Neuropathy    bilateral feet   Osteoarthritis    Sjogren's syndrome ([place]) dx oct 2021   sore muscvles, dry mouth and eyes   Stroke [facility])    Vertigo    Vision abnormalities     Past Surgical History:  Procedure Laterality Date   ABDOMINAL HYSTERECTOMY  2002   partial   COLONOSCOPY  01/27/2022   2 day prep   colonscopy  2011   CORONARY STENT INTERVENTION N/A 07/18/2021   Procedure: CORONARY STENT INTERVENTION;  Surgeon: Jettie Booze, MD;  Location: [place] [facility];  Service: Cardiovascular;  Laterality: N/A;   DILATATION & CURETTAGE/HYSTEROSCOPY WITH MYOSURE N/A 06/08/2020   Procedure: DILATATION & CURETTAGE/HYSTEROSCOPY/Polypectomy WITH MYOSURE;  Surgeon: Armandina Stammer, DO;  Location: [facility];  Service: Gynecology;  Laterality: N/A;   LEFT HEART CATH AND CORONARY ANGIOGRAPHY N/A  07/18/2021   Procedure: LEFT HEART CATH AND CORONARY ANGIOGRAPHY;  Surgeon: Jettie Booze, MD;  Location: [facility] [facility];  Service: Cardiovascular;  Laterality: N/A;   LUMBAR FUSION  2001   TOTAL HIP ARTHROPLASTY Right 01/17/2021   Procedure: TOTAL HIP ARTHROPLASTY ANTERIOR APPROACH;  Surgeon: Rod Can, MD;  [facility];  Service: Orthopedics;  Laterality: Right;   UPPER GI ENDOSCOPY  yrs ago    Current Medications: Current Meds  Medication Sig   aspirin EC 81 MG tablet Take 81 mg by mouth daily. Swallow whole.   carvedilol (COREG) 6.25 MG tablet Take 1 tablet (6.25 mg total) by mouth 2 (two) times daily with a meal.   cholecalciferol (VITAMIN D) 25 MCG (1000 UNIT) tablet Take  2,000 Units by mouth daily.   clonazePAM (KLONOPIN) 0.5 MG tablet TAKE 1 TABLET(0.5 MG) BY MOUTH AT BEDTIME   fexofenadine (ALLEGRA) 180 MG tablet Take 180 mg by mouth at bedtime.    Flaxseed, Linseed, (FLAXSEED OIL PO) Take 5-10 mLs by mouth 3 (three) times a week. Power   hydrALAZINE (APRESOLINE) 10 MG tablet TAKE 1 TABLET BY MOUTH EVERY 8 HOURS.   lamoTRIgine (LAMICTAL) 25 MG tablet Take 50 mg by mouth 2 (two) times daily.   leflunomide (ARAVA) 20 MG tablet TAKE 1 TABLET(20 MG) BY MOUTH DAILY   Lidocaine 4 % PTCH Place 1 patch onto the skin daily as needed (mild pain). Remove & Discard patch within 12 hours or as directed by MD   losartan (COZAAR) 100 MG tablet TAKE 1 TABLET(100 MG) BY MOUTH DAILY   melatonin 5 MG TABS Take 5 mg by mouth at bedtime as needed.   methylPREDNISolone (MEDROL) 4 MG tablet Taper from 6 pills po for one day to 1 pill po the last day over 6 days   modafinil (PROVIGIL) 200 MG tablet Take 1 tablet (200 mg total) by mouth in the morning.   nitroGLYCERIN (NITROSTAT) 0.4 MG SL tablet Place 1 tablet (0.4 mg total) under the tongue every 5 (five) minutes as needed for chest pain.   pantoprazole (PROTONIX) 40 MG tablet Take 1 tablet (40 mg total) by mouth daily.    rosuvastatin (CRESTOR) 10 MG tablet TAKE 1 TABLET BY MOUTH DAILY   traMADol (ULTRAM) 50 MG tablet Take 50 mg by mouth as needed for moderate pain.   [DISCONTINUED] clopidogrel (PLAVIX) 75 MG tablet TAKE 1 TABLET(75 MG) BY MOUTH DAILY     Allergies:   Statins   Social History   Socioeconomic History   Marital status: Unknown    Spouse name: Not on file   Number of children: Not on file   Years of education: Not on file   Highest education level: Not on file  Occupational History   Occupation: retired  Tobacco Use   Smoking status: Former    Packs/day: 0.50    Years: 15.00    Total pack years: 7.50    Types: Cigarettes    Quit date: 08/05/1983    Years since quitting: 38.6   Smokeless tobacco: Never  Vaping Use   Vaping Use: Never used  Substance and Sexual Activity   Alcohol use: Yes    Comment: rarely, maybe yearly drink   Drug use: Never   Sexual activity: Not Currently    Birth control/protection: Post-menopausal  Other Topics Concern   Not on file  Social History Narrative   Regular Exercise-no   Widowed   Retired   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     Tobacco use, amount per day now: former   Past tobacco use, amount per day: 1/2-1 packet   How many years did you use tobacco: 12 years   Alcohol use (drinks per week): 0   Diet: mediterranean based   Do you drink/eat things with caffeine: yes   Marital status:        widow                          What year were you married? 1987  Do you live in a house, apartment, assisted living, condo, trailer, etc.? house   Is it one or more stories? 1 story   How many persons live in your home? 1   Do you have pets in your home?( please list) no   Current or past profession: Science writer    Do you exercise?          yes                        Type  and how often? Tai chi and pt therapy, stretches etc....   Do you have a living will? yes   Do you have a DNR form?      no                             If not, do you want to discuss one? yes   Do you have signed POA/HPOA forms?      no                  If so, please bring to you appointment   Social Determinants of Health   Financial Resource Strain: Not on file  Food Insecurity: No Food Insecurity (01/07/2022)   Hunger Vital Sign    Worried About Running Out of Food in the Last Year: Never true    Ran Out of Food in the Last Year: Never true  Transportation Needs: No Transportation Needs (01/07/2022)   PRAPARE - Hydrologist (Medical): No    Lack of Transportation (Non-Medical): No  Physical Activity: Not on file  Stress: Not on file  Social Connections: Not on file     Family History: The patient's family history includes Arthritis in an other family member; Heart failure in her father and mother; Hypertension in an other family member. There is no history of Colon cancer, Colon polyps, Esophageal cancer, Stomach cancer, or Rectal cancer.  ROS:   Please see the history of present illness.     All other systems reviewed and are negative.  EKGs/Labs/Other Studies Reviewed:    The following studies were reviewed today:   EKG:  EKG is  ordered today.  The ekg ordered today demonstrates   04/01/2022- NSR  Prior ECGs NSR,  27m TWI laterally  NSR TWI inferiorly  Recent Labs: 06/16/2021: Magnesium 2.3 10/15/2021: ALT 14; BUN 11; Creat 0.71; Hemoglobin 13.2; Platelets 234; Potassium 4.4; Sodium 138; TSH 4.91   Recent Lipid Panel    Component Value Date/Time   CHOL 144 10/15/2021 0831   CHOL 138 09/03/2021 0959   TRIG 143 10/15/2021 0831   HDL 45 (L) 10/15/2021 0831   HDL 39 (L) 09/03/2021 0959   CHOLHDL 3.2 10/15/2021 0831   VLDL 27 06/15/2021 0256   LDLCALC 76 10/15/2021 0831   LDLDIRECT 130.2 11/03/2011 0920     Risk  Assessment/Calculations:           Physical Exam:    VS:  Vitals:   04/01/22 1007  BP: 124/66  Pulse: 71  SpO2: 98%      Wt Readings from Last 3 Encounters:  04/01/22 167 lb 3.2 oz (75.8 kg)  02/10/22 161 lb 12.8 oz (73.4 kg)  02/05/22 166 lb 6.4 oz (75.5 kg)     GEN:  Well nourished, well developed in no acute distress HEENT: Normal NECK: No JVD LYMPHATICS: No lymphadenopathy  CARDIAC: RRR, 3/6 SEM RUSB (flow) rubs, gallops RESPIRATORY:  Clear to auscultation without rales, wheezing or rhonchi  ABDOMEN: Soft, non-tender, non-distended MUSCULOSKELETAL:  No edema; No deformity  SKIN: Warm and dry NEUROLOGIC:  Alert and oriented x 3 PSYCHIATRIC:  Normal affect   ASSESSMENT:   #RCA PCI:  Inially presented after hospital FU reporting chest pressure and had nstemi. This was in the setting of hypertensive emergency. Was seen in FU and CTA was obtained 2/2 symptoms and CVD risk including CVA and smoking. She had RCA  FFR+ lesion. She is s/p  prox-mid DES.She denies CP. SOB persists on Brillinta. The cost is prohibitive. Changed to plavix requiring PPI change -s/p > 6 months of DAPT(asa/plavix); can stop plavix today - she is acceptable risk for nerve abulation -cont oreg 6.25 mg BID -continue nitro SL -she has her own exercise program that she does for her MS and would like to continue with that instead of cardiac rehab; I said that was fine  #HLD: LDL goal < 70 mg/dL - LDL at goal 69 mg/dL 09/03/2021 - continue crestor 10 mg daily  #HTN: well controlled.  Instructed to take blood pressures at home.Continue coreg 6.25 mg BID, hydralazine 10 mg TID, losartan 100 mg. Goal < 130/80 mmHg   PLAN:    In order of problems listed above:  Stop plavix Follow up 12 months          Medication Adjustments/Labs and Tests Ordered: Current medicines are reviewed at length with the patient today.  Concerns regarding medicines are outlined above.  Orders Placed This Encounter   Procedures   EKG 12-Lead   No orders of the defined types were placed in this encounter.   Patient Instructions  Medication Instructions:  STOP PLAVIX  *If you need a refill on your cardiac medications before your next appointment, please call your pharmacy*  Lab Work: None Ordered At This Time.  If you have labs (blood work) drawn today and your tests are completely normal, you will receive your results only by: [place] (if you have MyChart) OR A paper copy in the mail If you have any lab test that is abnormal or we need to change your treatment, we will call you to review the results.  Testing/Procedures: None Ordered At This Time.   Follow-Up: At [facility], you and your health needs are our priority.  As part of our continuing mission to provide you with exceptional heart care, we have created designated Provider Care Teams.  These Care Teams include your primary Cardiologist (physician) and Advanced Practice Providers (APPs -  Physician Assistants and Nurse Practitioners) who all work together to provide you with the care you need, when you need it.  Your next appointment:   1 year(s)  The format for your next appointment:   In Person  Provider:   Janina Mayo, MD            Signed, Janina Mayo, MD  04/01/2022 11:02 AM    [place]

## 2022-04-02 ENCOUNTER — Encounter (HOSPITAL_BASED_OUTPATIENT_CLINIC_OR_DEPARTMENT_OTHER): Payer: Medicare Other | Admitting: Physical Therapy

## 2022-04-03 ENCOUNTER — Other Ambulatory Visit: Payer: Self-pay | Admitting: Neurology

## 2022-04-04 ENCOUNTER — Encounter (HOSPITAL_BASED_OUTPATIENT_CLINIC_OR_DEPARTMENT_OTHER): Payer: Self-pay | Admitting: Physical Therapy

## 2022-04-04 ENCOUNTER — Ambulatory Visit (HOSPITAL_BASED_OUTPATIENT_CLINIC_OR_DEPARTMENT_OTHER): Payer: Medicare Other | Admitting: Physical Therapy

## 2022-04-04 DIAGNOSIS — M6281 Muscle weakness (generalized): Secondary | ICD-10-CM

## 2022-04-04 DIAGNOSIS — R262 Difficulty in walking, not elsewhere classified: Secondary | ICD-10-CM | POA: Diagnosis not present

## 2022-04-04 DIAGNOSIS — M5459 Other low back pain: Secondary | ICD-10-CM

## 2022-04-04 DIAGNOSIS — M542 Cervicalgia: Secondary | ICD-10-CM | POA: Diagnosis not present

## 2022-04-04 DIAGNOSIS — R252 Cramp and spasm: Secondary | ICD-10-CM | POA: Diagnosis not present

## 2022-04-04 DIAGNOSIS — M545 Low back pain, unspecified: Secondary | ICD-10-CM | POA: Diagnosis not present

## 2022-04-04 NOTE — Therapy (Signed)
[place] [address] [place], [place], [address] Phone: [phone]   Fax:  [phone]  Physical Therapy Treatment/re-cert   Patient Details  Name: BITHA FAUTEUX MRN: [id number] Date of Birth: [birth year] Referring Provider (PT): Dr Arlice Colt   Encounter Date: 04/04/2022   PT End of Session - 04/04/22 1159     Visit Number 42    Number of Visits 16    Date for PT Re-Evaluation 03/30/22    Authorization Type progress note done on visit 36 next to be done on 52    PT Start Time 1100    PT Stop Time 1142    PT Time Calculation (min) 42 min    Activity Tolerance Patient tolerated treatment well    Behavior During Therapy [facility] for tasks assessed/performed             Past Medical History:  Diagnosis Date   Allergy    Dry eyes    Endometrial polyp    Essential hypertension 08/25/2018   GERD (gastroesophageal reflux disease)    Heart failure ([place])    History of hiatal hernia    History of kidney stones    Hot flashes, menopausal 10/13/2011   Estradiol started    Jaundice as teenager   no problems since   Memory loss 09/21/2019     1/2 of feet numb all the time   Multiple sclerosis ([place]) dx 2001   Neuropathy    Neuropathy    bilateral feet   Osteoarthritis    Sjogren's syndrome ([place]) dx oct 2021   sore muscvles, dry mouth and eyes   Stroke ([place])    Vertigo    Vision abnormalities     Past Surgical History:  Procedure Laterality Date   ABDOMINAL HYSTERECTOMY  2002   partial   COLONOSCOPY  01/27/2022   2 day prep   colonscopy  2011   CORONARY STENT INTERVENTION N/A 07/18/2021   Procedure: CORONARY STENT INTERVENTION;  Surgeon: Jettie Booze, MD;  Location: [facility];  Service: Cardiovascular;  Laterality: N/A;   DILATATION & CURETTAGE/HYSTEROSCOPY WITH MYOSURE N/A 06/08/2020   Procedure: DILATATION & CURETTAGE/HYSTEROSCOPY/Polypectomy WITH MYOSURE;  Surgeon: Armandina Stammer, DO;   Location: [facility];  Service: Gynecology;  Laterality: N/A;   LEFT HEART CATH AND CORONARY ANGIOGRAPHY N/A 07/18/2021   Procedure: LEFT HEART CATH AND CORONARY ANGIOGRAPHY;  Surgeon: Jettie Booze, MD;  Location: [place] [facility];  Service: Cardiovascular;  Laterality: N/A;   LUMBAR FUSION  2001   TOTAL HIP ARTHROPLASTY Right 01/17/2021   Procedure: TOTAL HIP ARTHROPLASTY ANTERIOR APPROACH;  Surgeon: Rod Can, MD;  [facility];  Service: Orthopedics;  Laterality: Right;   UPPER GI ENDOSCOPY  yrs ago    There were no vitals filed for this visit.   Subjective Assessment - 04/04/22 1148     Subjective The patients back has been lfaired up over the past few days. She is doing as much as she can at home.    Pertinent History MS    How long can you stand comfortably? Hurts upon standing    How long can you walk comfortably? Limited community ambulation    Diagnostic tests Nothing recent    Patient Stated Goals to have less pain with ambualtion    Currently in Pain? Yes    Pain Score 5     Pain Location Back    Pain Orientation Right;Left    Pain  Descriptors / Indicators Aching    Pain Type Chronic pain    Pain Onset More than a month ago    Pain Frequency Intermittent    Aggravating Factors  pain waling and standing    Pain Relieving Factors rest    Effect of Pain on Daily Activities difficulty with ambualtion    Multiple Pain Sites No                  LTR x20 Double Knee to chest with ball x20  Heel press into the ball x20  Ball rotation x20    Supine wand flexion x20 3lb weight  Supine march x20    LAQ x20    Manual: trigger point release o right lower back and gluteal ; LAD to left leg                             PT Short Term Goals - 02/02/22 2042       PT SHORT TERM GOAL #1   Title Patient will report  <3/10 pain in the back and right hip    Baseline revised for hip and back    Time 4    Period  Weeks    Status Revised    Target Date 03/02/22      PT SHORT TERM GOAL #2   Title Patient will ambaulte 1200 with no significant spike in B/P or HR    Baseline 840 today    Time 4    Period Weeks    Status On-going    Target Date 01/24/22      PT SHORT TERM GOAL #3   Title Patient increase right hip flexion and abducion strength by 5 lbs    Baseline more limited since recent onset of pain    Time 4    Period Weeks    Status On-going    Target Date 03/02/22      PT SHORT TERM GOAL #4   Title Patient will be indepdnent with basic endruance and right hip strengthening program    Baseline working on base program    Time 4    Period Weeks    Status Achieved    Target Date 03/02/22               PT Long Term Goals - 02/02/22 2046       PT LONG TERM GOAL #1   Title Patient will return to [facility] with a full program for her hip/ lower back/ and endurance    Baseline was progressing towards returning until recent flair up    Time 8    Period Weeks    Status On-going      PT LONG TERM GOAL #2   Title Patient will ambualte 1200' the 6 min walk test in order to improve community ambualtion    Baseline 840    Time 8    Period Weeks    Status On-going    Target Date 03/30/22      PT LONG TERM GOAL #3   Title Patient will stand for 30 min without a significant increase in pain in order to perfrom ADL's    Baseline >15 min    Time 8    Period Weeks    Status On-going    Target Date 03/30/22                   Plan -  04/04/22 1159     Clinical Impression Statement At this point we will hold to see what the plan is with the procedure that she is having. If the procedure works and her MD does not feel like she needs PT she will D/C to HEP. If sh still requires PT we will re-assess the patient and perfrom a progress note at that time. At this tie she has a full HEP. She continues to have high levels of low back pain. We focused on manual therapy tod     Personal Factors and Comorbidities Comorbidity 1;Comorbidity 2    Comorbidities MS, OA    Examination-Participation Restrictions Meal Prep;Cleaning;Community Activity;Laundry;Shop;Occupation    Stability/Clinical Decision Making Stable/Uncomplicated    Clinical Decision Making Low    Rehab Potential Good    PT Frequency 1x / week    PT Duration 8 weeks    PT Treatment/Interventions ADLs/Self Care Home Management;Electrical Stimulation;Cryotherapy;Iontophoresis '4mg'$ /ml Dexamethasone;Moist Heat;Traction;DME Instruction;Neuromuscular re-education;Patient/family education;Manual techniques;Passive range of motion;Taping;Therapeutic activities;Therapeutic exercise;Balance training;Gait training;Stair training;Functional mobility training;Dry needling;Ultrasound    PT Next Visit Plan add in endurance exercises; review tolerance to interval training    PT Home Exercise Plan continue with progressiv estrengthening porgram    Consulted and Agree with Plan of Care Patient             Patient will benefit from skilled therapeutic intervention in order to improve the following deficits and impairments:  Abnormal gait, Difficulty walking, Decreased range of motion, Decreased mobility, Decreased strength, Postural dysfunction, Pain, Increased muscle spasms, Decreased activity tolerance, Increased fascial restricitons  Visit Diagnosis: Difficulty in walking, not elsewhere classified  Cramp and spasm  Other low back pain  Muscle weakness (generalized)     Problem List Patient Active Problem List   Diagnosis Date Noted   Positive colorectal cancer screening using Cologuard test 11/14/2021   Coronary artery disease    Acute diastolic CHF (congestive heart failure) ([place]) 06/15/2021   Demand ischemia ([place]) 06/15/2021   Chest pain 06/14/2021   Elevated troponin 06/14/2021   Statin intolerance 04/19/2021   Osteoarthritis of right hip 01/17/2021   Status post THR (total hip replacement)  01/17/2021   Chronic bilateral low back pain without sciatica 10/02/2020   Chronic pain syndrome 06/20/2020   Post laminectomy syndrome 06/20/2020   Sjogren's disease ([place]) 05/23/2020   Primary osteoarthritis of both hands 05/23/2020   Primary osteoarthritis of both feet 05/23/2020   Sacroiliitis, not elsewhere classified ([place]) 12/22/2019   Other secondary scoliosis, lumbar region 12/22/2019   Spondylosis without myelopathy or radiculopathy, lumbar region 12/22/2019   Cervical radiculopathy 10/18/2019   Numbness 09/21/2019   Bilateral carpal tunnel syndrome 09/21/2019   High risk medication use 09/21/2019   Memory loss 09/21/2019   Essential hypertension 08/25/2018   Abnormal SPEP 02/19/2018   Hand pain 02/20/2017   Multiple joint pain 02/18/2017   Right elbow pain 12/09/2016   Disturbed cognition 06/25/2016   Trochanteric bursitis of left hip 02/18/2016   Sciatica, right side 10/30/2015   Bilateral arm pain 10/10/2015   Trochanteric bursitis of both hips 08/04/2014   Chronic fatigue 08/04/2014   Urinary frequency 08/04/2014   Abnormality of gait 08/04/2014   Unspecified visual disturbance 01/31/2013   Transient vision disturbance 01/31/2013   Routine general medical examination at a health care facility 11/04/2011   Colon polyps 11/03/2011   Hot flashes, menopausal 10/13/2011   POSTHERPETIC NEURALGIA 10/03/2009   DISPLCMT LUMBAR INTERVERT DISC W/O MYELOPATHY 09/05/2009   RENAL CALCULUS, RECURRENT 09/04/2009  Hyperlipidemia 08/31/2009   Multiple sclerosis ([place]) 08/31/2009   ALLERGIC RHINITIS 08/31/2009    Carney Living, PT 04/04/2022, 12:26 PM  [facility] [address] [place], [place], [address] Phone: [phone]   Fax:  [phone]  Name: ASHYIA SCHRAEDER MRN: [id number] Date of Birth: 16-Nov-1945

## 2022-04-07 DIAGNOSIS — M47816 Spondylosis without myelopathy or radiculopathy, lumbar region: Secondary | ICD-10-CM | POA: Diagnosis not present

## 2022-04-08 ENCOUNTER — Telehealth: Payer: Self-pay | Admitting: *Deleted

## 2022-04-08 NOTE — Telephone Encounter (Signed)
Submitted PA modafinil on CMM. Key: [id number]. Waiting on determination from [employer] Part D.

## 2022-04-09 NOTE — Telephone Encounter (Signed)
"  Request Reference Number: [id number]. MODAFINIL TAB '200MG'$  is approved through 10/07/2022. Your patient may now fill this prescription and it will be covered."

## 2022-04-14 DIAGNOSIS — H0014 Chalazion left upper eyelid: Secondary | ICD-10-CM | POA: Diagnosis not present

## 2022-04-18 ENCOUNTER — Other Ambulatory Visit: Payer: Medicare Other

## 2022-04-18 DIAGNOSIS — E782 Mixed hyperlipidemia: Secondary | ICD-10-CM

## 2022-04-18 DIAGNOSIS — I5032 Chronic diastolic (congestive) heart failure: Secondary | ICD-10-CM

## 2022-04-18 DIAGNOSIS — I10 Essential (primary) hypertension: Secondary | ICD-10-CM | POA: Diagnosis not present

## 2022-04-19 LAB — CBC WITH DIFFERENTIAL/PLATELET
Absolute Monocytes: 307 cells/uL (ref 200–950)
Basophils Absolute: 70 cells/uL (ref 0–200)
Basophils Relative: 1.9 %
Eosinophils Absolute: 130 cells/uL (ref 15–500)
Eosinophils Relative: 3.5 %
HCT: 38.8 % (ref 35.0–45.0)
Hemoglobin: 12.9 g/dL (ref 11.7–15.5)
Lymphs Abs: 644 cells/uL — ABNORMAL LOW (ref 850–3900)
MCH: 30.3 pg (ref 27.0–33.0)
MCHC: 33.2 g/dL (ref 32.0–36.0)
MCV: 91.1 fL (ref 80.0–100.0)
MPV: 11.3 fL (ref 7.5–12.5)
Monocytes Relative: 8.3 %
Neutro Abs: 2549 cells/uL (ref 1500–7800)
Neutrophils Relative %: 68.9 %
Platelets: 192 10*3/uL (ref 140–400)
RBC: 4.26 10*6/uL (ref 3.80–5.10)
RDW: 13.1 % (ref 11.0–15.0)
Total Lymphocyte: 17.4 %
WBC: 3.7 10*3/uL — ABNORMAL LOW (ref 3.8–10.8)

## 2022-04-19 LAB — COMPLETE METABOLIC PANEL WITH GFR
AG Ratio: 2.1 (calc) (ref 1.0–2.5)
ALT: 14 U/L (ref 6–29)
AST: 18 U/L (ref 10–35)
Albumin: 4.2 g/dL (ref 3.6–5.1)
Alkaline phosphatase (APISO): 124 U/L (ref 37–153)
BUN: 16 mg/dL (ref 7–25)
CO2: 27 mmol/L (ref 20–32)
Calcium: 9.4 mg/dL (ref 8.6–10.4)
Chloride: 105 mmol/L (ref 98–110)
Creat: 0.69 mg/dL (ref 0.60–1.00)
Globulin: 2 g/dL (calc) (ref 1.9–3.7)
Glucose, Bld: 83 mg/dL (ref 65–99)
Potassium: 4.2 mmol/L (ref 3.5–5.3)
Sodium: 139 mmol/L (ref 135–146)
Total Bilirubin: 0.4 mg/dL (ref 0.2–1.2)
Total Protein: 6.2 g/dL (ref 6.1–8.1)
eGFR: 91 mL/min/{1.73_m2} (ref >=60)

## 2022-04-19 LAB — LIPID PANEL
Cholesterol: 146 mg/dL (ref <200)
HDL: 44 mg/dL — ABNORMAL LOW (ref >=50)
LDL Cholesterol (Calc): 78 mg/dL (calc)
Non-HDL Cholesterol (Calc): 102 mg/dL (calc) (ref <130)
Total CHOL/HDL Ratio: 3.3 (calc) (ref <5.0)
Triglycerides: 144 mg/dL (ref <150)

## 2022-04-19 LAB — TSH: TSH: 5.41 mIU/L — ABNORMAL HIGH (ref 0.40–4.50)

## 2022-04-22 ENCOUNTER — Ambulatory Visit (INDEPENDENT_AMBULATORY_CARE_PROVIDER_SITE_OTHER): Payer: Medicare Other | Admitting: Family

## 2022-04-22 ENCOUNTER — Other Ambulatory Visit: Payer: Self-pay

## 2022-04-22 ENCOUNTER — Encounter: Payer: Self-pay | Admitting: Family

## 2022-04-22 VITALS — BP 130/74 | HR 71 | Temp 98.2°F | Resp 17 | Ht 62.0 in | Wt 169.8 lb

## 2022-04-22 DIAGNOSIS — E782 Mixed hyperlipidemia: Secondary | ICD-10-CM | POA: Diagnosis not present

## 2022-04-22 DIAGNOSIS — G35 Multiple sclerosis: Secondary | ICD-10-CM

## 2022-04-22 DIAGNOSIS — Z23 Encounter for immunization: Secondary | ICD-10-CM

## 2022-04-22 DIAGNOSIS — I5032 Chronic diastolic (congestive) heart failure: Secondary | ICD-10-CM

## 2022-04-22 DIAGNOSIS — I10 Essential (primary) hypertension: Secondary | ICD-10-CM

## 2022-04-22 DIAGNOSIS — I2510 Atherosclerotic heart disease of native coronary artery without angina pectoris: Secondary | ICD-10-CM

## 2022-04-22 DIAGNOSIS — M7062 Trochanteric bursitis, left hip: Secondary | ICD-10-CM | POA: Diagnosis not present

## 2022-04-22 NOTE — Progress Notes (Signed)
Provider: Marlowe Sax FNP-C   Ortha Metts, [name], NP  Patient Care Team: Mindee Robledo, [name], NP as PCP - General (Family Medicine) Janina Mayo, MD as PCP - Cardiology (Cardiology) [name], MD as Consulting Physician (Orthopedic Surgery) [name], [name], MD (Neurology) [name], [name], [place] (Optometry) [name], NP as Nurse Practitioner (Obstetrics and Gynecology) [name], MD (Inactive) as Consulting Physician (Dermatology)  Extended Emergency Contact Information Primary Emergency Contact: [facility] Phone: [phone] Mobile Phone: [phone] Relation: Friend Secondary Emergency Contact: [place] Mobile Phone: [phone] Relation: Friend  Code Status:  Full [place] e Goals of care: Advanced Directive information    04/22/2022    9:58 AM  Advanced Directives  Does Patient Have a Medical Advance Directive? Yes  Type of Paramedic of [place];Living will;Out of facility DNR (pink MOST or yellow form)  Copy of [place] in Chart? Yes - validated most recent copy scanned in chart (See row information)     Chief Complaint  Patient presents with   Medical Management of Chronic Issues    Patient is in office for 6 month follow up    Immunizations    Discussed the need for tetanus, covid, shingles and flu vaccines.patient updated [place] with the dates.    HPI:  Pt is a 76 y.o. female seen today for 47-monthfollow-up for medical management of chronic diseases. She denies any acute issues during this visit. Recent lab result was reviewed and discussed during visit.  White blood cells slightly low at 3.7 denies any signs of infection.  TSH slightly elevated 5.41 previous 4.91 not currently on any hypothyroid medication.  Denies any symptoms of hypothyroidism.  Hyperlipidemia -total cholesterol, triglycerides and LDL are all within normal range except HDL slightly low 44 previous was  45.  Hypertension  -states home B/p runs in the 110's - 130's.denies any headache,dizziness,vision changes,fatigue,chest tightness,palpitation,chest pain or shortness of breath.   Cologuard on 11/01/2021 was positive she was referred to gastroenterologist for colonoscopy.  Has a history of previous adenoma in 2008.  Has no family history of colon cancer.  Colonoscopy done on 01/27/2022 showed non-bleeding small external and internal hemorrhoids.  Also had  A 2 mm polyp found in the transverse colon. The polyp was flat and The polyp was removed with a cold snare.  Also found  A 3 mm polyp in the ascending colon. The polyp was sessil.  She also continues to follow-up with a neurologist for multiple sclerosis last seen 02/05/2022.she was advised to continue on Leflunomide ,clonazepam ,Modafnil and Physical Therapy.Lamotrigine was added for Neuropathy.Plan for possible adding RFA of L5S1 facet joint with Dr.Ibazebo.she will f/u in 6 months with Dr.[name] Richard.     Past Medical History:  Diagnosis Date   Allergy    Dry eyes    Endometrial polyp    Essential hypertension 08/25/2018   GERD (gastroesophageal reflux disease)    Heart failure (HCC)    History of hiatal hernia    History of kidney stones    Hot flashes, menopausal 10/13/2011   Estradiol started    Jaundice as teenager   no problems since   Memory loss 09/21/2019     1/2 of feet numb all the time   Multiple sclerosis ([place] 2001   Neuropathy    Neuropathy    bilateral feet   Osteoarthritis    S[place]ogren's syndrome ([place]   sore muscvles, dry mouth and eyes   Stroke (  [place])    Vertigo    Vision abnormalities    Past Surgical History:  Procedure Laterality Date   ABDOMINAL HYSTERECTOMY  2002   partial   COLONOSCOPY  01/27/2022   2 day prep   colonscopy  2011   CORONARY STENT INTERVENTION N/A 07/18/2021   Procedure: CORONARY STENT INTERVENTION;  Surgeon: Jettie Booze, MD;  Location: [place] [facility];   Service: Cardiovascular;  Laterality: N/A;   DILATATION & CURETTAGE/HYSTEROSCOPY WITH MYOSURE N/A 06/08/2020   Procedure: DILATATION & CURETTAGE/HYSTEROSCOPY/Polypectomy WITH MYOSURE;  Surgeon: Armandina Stammer, DO;  Location: [place];  Service: Gynecology;  Laterality: N/A;   LEFT HEART CATH AND CORONARY ANGIOGRAPHY N/A 07/18/2021   Procedure: LEFT HEART CATH AND CORONARY ANGIOGRAPHY;  Surgeon: Jettie Booze, MD;  Location: [place] [facility];  Service: Cardiovascular;  Laterality: N/A;   LUMBAR FUSION  2001   TOTAL HIP ARTHROPLASTY Right 01/17/2021   Procedure: TOTAL HIP ARTHROPLASTY ANTERIOR APPROACH;  Surgeon: Rod Can, MD;  Location: [facility];  Service: Orthopedics;  Laterality: Right;   UPPER GI ENDOSCOPY  yrs ago    Allergies  Allergen Reactions   Statins Other (See Comments)    Muscle pain    Allergies as of 04/22/2022       Reactions   Statins Other (See Comments)   Muscle pain        Medication List        Accurate as of April 22, 2022 10:16 AM. If you have any questions, ask your nurse or doctor.          aspirin EC 81 MG tablet Take 81 mg by mouth daily. Swallow whole.   carvedilol 6.25 MG tablet Commonly known as: COREG Take 1 tablet (6.25 mg total) by mouth 2 (two) times daily with a meal.   cholecalciferol 25 MCG (1000 UNIT) tablet Commonly known as: VITAMIN D3 Take 2,000 Units by mouth daily.   clonazePAM 0.5 MG tablet Commonly known as: KLONOPIN TAKE 1 TABLET(0.5 MG) BY MOUTH AT BEDTIME   fexofenadine 180 MG tablet Commonly known as: ALLEGRA Take 180 mg by mouth at bedtime.   FLAXSEED OIL PO Take 5-10 mLs by mouth 3 (three) times a week. Power   hydrALAZINE 10 MG tablet Commonly known as: APRESOLINE TAKE 1 TABLET BY MOUTH EVERY 8 HOURS.   lamoTRIgine 25 MG tablet Commonly known as: LAMICTAL Take 50 mg by mouth 2 (two) times daily.   leflunomide 20 MG tablet Commonly known as: ARAVA TAKE 1 TABLET(20 MG)  BY MOUTH DAILY   lidocaine 4 % Place 1 patch onto the skin daily as needed (mild pain). Remove & Discard patch within 12 hours or as directed by MD   losartan 100 MG tablet Commonly known as: COZAAR TAKE 1 TABLET(100 MG) BY MOUTH DAILY   melatonin 5 MG Tabs Take 5 mg by mouth at bedtime as needed.   methylPREDNISolone 4 MG tablet Commonly known as: MEDROL Taper from 6 pills po for one day to 1 pill po the last day over 6 days   modafinil 200 MG tablet Commonly known as: PROVIGIL TAKE 1 TABLET(200 MG) BY MOUTH IN THE MORNING   nitroGLYCERIN 0.4 MG SL tablet Commonly known as: NITROSTAT Place 1 tablet (0.4 mg total) under the tongue every 5 (five) minutes as needed for chest pain.   pantoprazole 40 MG tablet Commonly known as: PROTONIX Take 1 tablet (40 mg total) by mouth daily.   rosuvastatin 10 MG  tablet Commonly known as: CRESTOR TAKE 1 TABLET BY MOUTH DAILY   traMADol 50 MG tablet Commonly known as: ULTRAM Take 50 mg by mouth as needed for moderate pain.        Review of Systems  Constitutional:  Negative for appetite change, chills, fatigue, fever and unexpected weight change.  HENT:  Negative for congestion, dental problem, ear discharge, ear pain, facial swelling, hearing loss, nosebleeds, postnasal drip, rhinorrhea, sinus pressure, sinus pain, sneezing, sore throat, tinnitus and trouble swallowing.   Eyes:  Negative for pain, discharge, redness, itching and visual disturbance.  Respiratory:  Negative for cough, chest tightness, shortness of breath and wheezing.   Cardiovascular:  Negative for chest pain, palpitations and leg swelling.  Gastrointestinal:  Negative for abdominal distention, abdominal pain, blood in stool, constipation, diarrhea, nausea and vomiting.  Endocrine: Negative for cold intolerance, heat intolerance, polydipsia, polyphagia and polyuria.  Genitourinary:  Negative for difficulty urinating, dysuria, flank pain, frequency and urgency.   Musculoskeletal:  Positive for arthralgias, back pain and gait problem. Negative for joint swelling, myalgias, neck pain and neck stiffness.  Skin:  Negative for color change, pallor, rash and wound.  Neurological:  Negative for dizziness, syncope, speech difficulty, weakness, light-headedness, numbness and headaches.  Hematological:  Does not bruise/bleed easily.  Psychiatric/Behavioral:  Positive for sleep disturbance. Negative for agitation, behavioral problems, confusion, hallucinations, self-injury and suicidal ideas. The patient is not nervous/anxious.     Immunization History  Administered Date(s) Administered   Fluad Quad(high Dose 65+) 04/01/2019, 04/19/2021   Influenza Whole 05/14/2011   Influenza, High Dose Seasonal PF 04/30/2017, 05/15/2018, 05/18/2020   Influenza-Unspecified 03/04/2022   PFIZER(Purple Top)SARS-COV-2 Vaccination 08/27/2019, 09/21/2019, 03/07/2020, 11/16/2020   Pfizer Covid-19 Vaccine Bivalent Booster 39yr & up 03/04/2022   Pneumococcal Conjugate-13 08/30/2018   Pneumococcal Polysaccharide-23 03/21/2009, 04/20/2020   Td 12/04/2009   Zoster, Live 11/29/2009   Pertinent  Health Maintenance Due  Topic Date Due   DEXA SCAN  07/21/2022 (Originally 12/20/2012)   MAMMOGRAM  04/16/2023   INFLUENZA VACCINE  Completed      07/25/2021   10:51 AM 10/18/2021   10:45 AM 01/07/2022    9:50 AM 02/10/2022    3:05 PM 04/22/2022    9:58 AM  Fall Risk  Falls in the past year? 0 0 0 0 0  Was there an injury with Fall? 0 0  0 0  Fall Risk Category Calculator 0 0  0 0  Fall Risk Category Low Low  Low Low  Patient Fall Risk Level Low fall risk Low fall risk  Low fall risk Low fall risk  Patient at Risk for Falls Due to No Fall Risks No Fall Risks  No Fall Risks History of fall(s)  Fall risk Follow up Falls evaluation completed Falls evaluation completed  Falls evaluation completed Falls evaluation completed   Functional Status Survey:    Vitals:   04/22/22 0959  BP:  130/74  Pulse: 71  Resp: 17  Temp: 98.2 F (36.8 C)  TempSrc: Temporal  SpO2: 96%  Weight: 169 lb 12.8 oz (77 kg)  Height: '5\' 2"'$  (1.575 m)   Body mass index is 31.06 kg/m. Physical Exam Vitals reviewed.  Constitutional:      General: She is not in acute distress.    Appearance: Normal appearance. She is obese. She is not ill-appearing or diaphoretic.  HENT:     Head: Normocephalic.     Right Ear: Tympanic membrane, ear canal and external ear normal. There is no  impacted cerumen.     Left Ear: Tympanic membrane, ear canal and external ear normal. There is no impacted cerumen.     Nose: Nose normal. No congestion or rhinorrhea.     Mouth/Throat:     Mouth: Mucous membranes are moist.     Pharynx: Oropharynx is clear. No oropharyngeal exudate or posterior oropharyngeal erythema.  Eyes:     General: No scleral icterus.       Right eye: No discharge.        Left eye: No discharge.     Extraocular Movements: Extraocular movements intact.     Conjunctiva/sclera: Conjunctivae normal.     Pupils: Pupils are equal, round, and reactive to light.  Neck:     Vascular: No carotid bruit.  Cardiovascular:     Rate and Rhythm: Normal rate and regular rhythm.     Pulses: Normal pulses.     Heart sounds: Normal heart sounds. No murmur heard.    No friction rub. No gallop.  Pulmonary:     Effort: Pulmonary effort is normal. No respiratory distress.     Breath sounds: Normal breath sounds. No wheezing, rhonchi or rales.  Chest:     Chest wall: No tenderness.  Abdominal:     General: Bowel sounds are normal. There is no distension.     Palpations: Abdomen is soft. There is no mass.     Tenderness: There is no abdominal tenderness. There is no right CVA tenderness, left CVA tenderness, guarding or rebound.  Musculoskeletal:        General: No swelling or tenderness.     Cervical back: Normal range of motion. No rigidity or tenderness.     Right lower leg: No edema.     Left lower leg: No  edema.     Comments: Ambulates with a walker   Lymphadenopathy:     Cervical: No cervical adenopathy.  Skin:    General: Skin is warm and dry.     Coloration: Skin is not pale.     Findings: No bruising, erythema, lesion or rash.  Neurological:     Mental Status: She is alert and oriented to person, place, and time.     Cranial Nerves: No cranial nerve deficit.     Sensory: No sensory deficit.     Motor: No weakness.     Coordination: Coordination normal.     Gait: Gait normal.  Psychiatric:        Mood and Affect: Mood normal.        Speech: Speech normal.        Behavior: Behavior normal.        Thought Content: Thought content normal.        Judgment: Judgment normal.    Labs reviewed: Recent Labs    06/15/21 0638 06/16/21 0222 07/08/21 0942 07/25/21 1135 10/15/21 0831 04/18/22 0845  NA  --  138   < > 138 138 139  K  --  4.5   < > 4.5 4.4 4.2  CL  --  109   < > 104 105 105  CO2  --  24   < > '26 27 27  '$ GLUCOSE  --  91   < > 88 90 83  BUN  --  16   < > '17 11 16  '$ CREATININE  --  0.72   < > 0.67 0.71 0.69  CALCIUM  --  8.7*   < > 9.9 9.4 9.4  MG 1.6* 2.3  --   --   --   --  PHOS 3.9 3.9  --   --   --   --    < > = values in this interval not displayed.   Recent Labs    06/16/21 0222 10/15/21 0831 04/18/22 0845  AST '17 15 18  '$ ALT '16 14 14  '$ ALKPHOS 101  --   --   BILITOT 0.4 0.3 0.4  PROT 5.6* 5.9* 6.2  ALBUMIN 3.4*  --   --    Recent Labs    08/01/21 1049 10/15/21 0831 04/18/22 0845  WBC 6.7 4.0 3.7*  NEUTROABS 5.1 2,824 2,549  HGB 13.0 13.2 12.9  HCT 38.0 40.8 38.8  MCV 86 90.3 91.1  PLT 261 234 192   Lab Results  Component Value Date   TSH 5.41 (H) 04/18/2022   Lab Results  Component Value Date   HGBA1C 5.4 06/15/2021   Lab Results  Component Value Date   CHOL 146 04/18/2022   HDL 44 (L) 04/18/2022   LDLCALC 78 04/18/2022   LDLDIRECT 130.2 11/03/2011   TRIG 144 04/18/2022   CHOLHDL 3.3 04/18/2022    Significant Diagnostic Results  in last 30 days:  No results found.  Assessment/Plan  1. Essential hypertension Blood pressure well controlled -Continue on losartan, hydralazine and carvedilol - TSH; Future - COMPLETE METABOLIC PANEL WITH GFR; Future - CBC with Differential/Platelet; Future  2. Mixed hyperlipidemia Previous LDL at goal -Continue on rosuvastatin - Lipid panel; Future  3. Chronic diastolic congestive heart failure (HCC) No signs of fluid overload. Not on diuretic - COMPLETE METABOLIC PANEL WITH GFR; Future  4. Trochanteric bursitis of left hip Continue on tramadol as needed Continue to follow-up with orthopedic  5.Multiple Sclerosis  -Continue to follow-up with a neurologist - on Leflunomide ,clonazepam ,Modafnil.Lamotrigine and Physical Therapy  Family/ staff Communication: Reviewed plan of care with patient verbalized understanding   Labs/tests ordered:  - TSH; Future - COMPLETE METABOLIC PANEL WITH GFR; Future - CBC with Differential/Platelet; Future - Lipid panel; Future  Next Appointment : Return in about 6 months (around 10/22/2022) for medical mangement of chronic issues., Fasting labs in 6 months prior to visit.TSH in 3 months .   Sandrea Hughs, NP

## 2022-05-08 ENCOUNTER — Emergency Department (HOSPITAL_COMMUNITY): Payer: Medicare Other

## 2022-05-08 ENCOUNTER — Emergency Department (HOSPITAL_COMMUNITY)
Admission: EM | Admit: 2022-05-08 | Discharge: 2022-05-09 | Disposition: A | Discharge status: Home / Self Care | Payer: Medicare Other | Attending: Emergency Medicine | Admitting: Emergency Medicine

## 2022-05-08 DIAGNOSIS — G43909 Migraine, unspecified, not intractable, without status migrainosus: Secondary | ICD-10-CM | POA: Diagnosis not present

## 2022-05-08 DIAGNOSIS — Z79899 Other long term (current) drug therapy: Secondary | ICD-10-CM | POA: Diagnosis not present

## 2022-05-08 DIAGNOSIS — I10 Essential (primary) hypertension: Secondary | ICD-10-CM

## 2022-05-08 DIAGNOSIS — G43109 Migraine with aura, not intractable, without status migrainosus: Secondary | ICD-10-CM

## 2022-05-08 DIAGNOSIS — G43B0 Ophthalmoplegic migraine, not intractable: Secondary | ICD-10-CM | POA: Insufficient documentation

## 2022-05-08 DIAGNOSIS — I509 Heart failure, unspecified: Secondary | ICD-10-CM | POA: Insufficient documentation

## 2022-05-08 DIAGNOSIS — I110 Hypertensive heart disease with heart failure: Secondary | ICD-10-CM | POA: Diagnosis not present

## 2022-05-08 DIAGNOSIS — Z7982 Long term (current) use of aspirin: Secondary | ICD-10-CM | POA: Insufficient documentation

## 2022-05-08 DIAGNOSIS — H5712 Ocular pain, left eye: Secondary | ICD-10-CM | POA: Diagnosis present

## 2022-05-08 DIAGNOSIS — R519 Headache, unspecified: Secondary | ICD-10-CM | POA: Diagnosis not present

## 2022-05-08 NOTE — ED Triage Notes (Signed)
Patient states that around 7pm tonight she started to get an optical migraine and felt as if her blood pressure was going on. States that her right arm is feeling funny- feels different, heavy, numb. Complaining of headache on the left side of her head. Blood pressure at home 172/77. History of TIA's. Had heart failure and stent placement, just taken off plavix about one month ago. Hx of MS and uses cane.

## 2022-05-08 NOTE — ED Provider Triage Note (Addendum)
Emergency Medicine Provider Triage Evaluation Note  Dawn [name] , a 76 y.o. female  was evaluated in triage.  Pt complains of headache and right arm heaviness.  History of multiple sclerosis and CHF.  Started to have headache tonight around 7 PM that felt like a migraine which is something she has had before.  Endorses associated photophobia and phonophobia.  Stated also that her right arm felt "heavy".  Also stated that she "folic I was not thinking straight".  No shortness of breath or chest pain. Takes a baby aspirin daily.  Denies trauma. Review of Systems  Positive: See above Negative: See above  Physical Exam  BP (!) 161/87 (BP Location: Left Arm)   Pulse 69   Temp 98.4 F (36.9 C) (Oral)   Resp 18   Ht '5\' 2"'$  (1.575 m)   Wt 75.8 kg   SpO2 94%   BMI 30.54 kg/m  Gen:   Awake, no distress   Resp:  Normal effort  MSK:   Moves extremities without difficulty  Other:  4/5 strength in all extremities.  Gait normal with walking cane.  No FND.  Medical Decision Making  Medically screening exam initiated at 10:30 PM.  Appropriate orders placed.  Dawn [name] was informed that the remainder of the evaluation will be completed by another provider, this initial triage assessment does not replace that evaluation, and the importance of remaining in the ED until their evaluation is complete.  Work up initiated   Harriet Pho, PA-C 05/08/22 2238    Harriet Pho, PA-C 05/08/22 2242

## 2022-05-09 ENCOUNTER — Telehealth: Payer: Self-pay | Admitting: Neurology

## 2022-05-09 ENCOUNTER — Telehealth: Payer: Self-pay | Admitting: Physician Assistant

## 2022-05-09 DIAGNOSIS — G43B0 Ophthalmoplegic migraine, not intractable: Secondary | ICD-10-CM | POA: Diagnosis not present

## 2022-05-09 LAB — COMPREHENSIVE METABOLIC PANEL
ALT: 19 U/L (ref 0–44)
AST: 23 U/L (ref 15–41)
Albumin: 4.1 g/dL (ref 3.5–5.0)
Alkaline Phosphatase: 104 U/L (ref 38–126)
Anion gap: 7 (ref 5–15)
BUN: 26 mg/dL — ABNORMAL HIGH (ref 8–23)
CO2: 23 mmol/L (ref 22–32)
Calcium: 9.2 mg/dL (ref 8.9–10.3)
Chloride: 107 mmol/L (ref 98–111)
Creatinine, Ser: 0.68 mg/dL (ref 0.44–1.00)
GFR, Estimated: >60 mL/min (ref >60)
Glucose, Bld: 96 mg/dL (ref 70–99)
Potassium: 3.8 mmol/L (ref 3.5–5.1)
Sodium: 137 mmol/L (ref 135–145)
Total Bilirubin: 0.5 mg/dL (ref 0.3–1.2)
Total Protein: 6.7 g/dL (ref 6.5–8.1)

## 2022-05-09 LAB — CBC WITH DIFFERENTIAL/PLATELET
Abs Immature Granulocytes: 0.01 10*3/uL (ref 0.00–0.07)
Basophils Absolute: 0.1 10*3/uL (ref 0.0–0.1)
Basophils Relative: 2 %
Eosinophils Absolute: 0.2 10*3/uL (ref 0.0–0.5)
Eosinophils Relative: 3 %
HCT: 40.5 % (ref 36.0–46.0)
Hemoglobin: 13.1 g/dL (ref 12.0–15.0)
Immature Granulocytes: 0 %
Lymphocytes Relative: 19 %
Lymphs Abs: 1.1 10*3/uL (ref 0.7–4.0)
MCH: 30.3 pg (ref 26.0–34.0)
MCHC: 32.3 g/dL (ref 30.0–36.0)
MCV: 93.5 fL (ref 80.0–100.0)
Monocytes Absolute: 0.5 10*3/uL (ref 0.1–1.0)
Monocytes Relative: 8 %
Neutro Abs: 4.1 10*3/uL (ref 1.7–7.7)
Neutrophils Relative %: 68 %
Platelets: 198 10*3/uL (ref 150–400)
RBC: 4.33 MIL/uL (ref 3.87–5.11)
RDW: 14.4 % (ref 11.5–15.5)
WBC: 6.1 10*3/uL (ref 4.0–10.5)
nRBC: 0 % (ref 0.0–0.2)

## 2022-05-09 LAB — URINALYSIS, ROUTINE W REFLEX MICROSCOPIC
Bilirubin Urine: NEGATIVE
Glucose, UA: NEGATIVE mg/dL
Ketones, ur: NEGATIVE mg/dL
Nitrite: NEGATIVE
Protein, ur: NEGATIVE mg/dL
Specific Gravity, Urine: 1.02 (ref 1.005–1.030)
pH: 5 (ref 5.0–8.0)

## 2022-05-09 MED ORDER — ACETAMINOPHEN 325 MG PO TABS
650.0000 mg | ORAL_TABLET | Freq: Once | ORAL | Status: DC
  Filled 2022-05-09: qty 2

## 2022-05-09 NOTE — Discharge Instructions (Addendum)
I recommend that you follow-up with your neurologist for further evaluation of the symptoms that you discussed today, as well as your PCP or cardiologist for discussion of your blood pressure medication if you continue to have high blood pressure despite taking her blood pressure medication as prescribed.  You develop chest pain, shortness of breath, or return of neurologic symptoms I recommend that you return to the emergency department.

## 2022-05-09 NOTE — Telephone Encounter (Addendum)
Pt has called from ED stating she is secondary progressive and she believes she has relapsed.Pt has refused the MRI the hospital wanted to run, she states she will be going home soon.  Pt is asking for a call from RN on Monday of next week to discuss.

## 2022-05-09 NOTE — ED Provider Notes (Signed)
[facility] DEPT Provider Note   CSN: [id number] Arrival date & time: 05/08/22  2108     History  No chief complaint on file.   Dawn [name] is a 76 y.o. female with past medical history significant for secondary progressive multiple sclerosis, hypertension, recent heart failure, stent placement presents with concern for elevated blood pressure, and some neurologic symptoms.  Patient reports some left-sided eye pain, what felt like a "left-sided optical migraine" prior to arrival around 7 PM last night.  Patient reported that she felt like her blood pressure was elevated.  She denies any chest pain, shortness of breath.  At time my evaluation she reports that any vision changes have improved in the left eye but she still having a mild to moderate left-sided headache that she rates about 5/10.  She reports that it had responded to Tylenol but she has not had any since last night.  Patient denies any dysuria, hematuria, abdominal pain, nausea, vomiting.  For other neurologic symptoms she reports that her right arm feels heavy or large, but denies specific numbness, tingling.  She reports baseline numbness in her feet.  She takes leflunomide, and reports that she had a recent nerve block treatment for her MS, has not been on oral steroids recently.  HPI     Home Medications Prior to Admission medications   Medication Sig Start Date End Date Taking? Authorizing Provider  acetaminophen (TYLENOL) 500 MG tablet Take 1,000 mg by mouth every 6 (six) hours as needed for mild pain or moderate pain.   Yes [provider]  aspirin EC 81 MG tablet Take 81 mg by mouth daily. Swallow whole.   Yes [provider]  carvedilol (COREG) 6.25 MG tablet Take 1 tablet (6.25 mg total) by mouth 2 (two) times daily with a meal. 07/19/21  Yes Branch, Royetta Crochet, MD  cholecalciferol (VITAMIN D) 25 MCG (1000 UNIT) tablet Take 2,000 Units by mouth daily.   Yes [provider]  clonazePAM (KLONOPIN) 0.5 MG tablet TAKE 1 TABLET(0.5 MG) BY MOUTH AT BEDTIME Patient taking differently: Take 0.5 mg by mouth at bedtime. 01/13/22  Yes [name], [name], MD  fexofenadine (ALLEGRA) 180 MG tablet Take 180 mg by mouth at bedtime.    Yes [provider]  Flaxseed, Linseed, (FLAXSEED OIL PO) Take 5-10 mLs by mouth 3 (three) times a week. Power   Yes [provider]  fluticasone (FLONASE) 50 MCG/ACT nasal spray Place 1 spray into both nostrils daily as needed for allergies or rhinitis.   Yes [provider]  hydrALAZINE (APRESOLINE) 10 MG tablet TAKE 1 TABLET BY MOUTH EVERY 8 HOURS. Patient taking differently: Take 10 mg by mouth every 8 (eight) hours. 12/11/21  Yes BranchRoyetta Crochet, MD  leflunomide (ARAVA) 20 MG tablet TAKE 1 TABLET(20 MG) BY MOUTH DAILY Patient taking differently: Take 20 mg by mouth daily. 10/02/21  Yes [name], [name], MD  Lidocaine 4 % PTCH Place 1 patch onto the skin daily as needed (mild pain). Remove & Discard patch within 12 hours or as directed by MD   Yes [provider]  losartan (COZAAR) 100 MG tablet TAKE 1 TABLET(100 MG) BY MOUTH DAILY Patient taking differently: Take 100 mg by mouth daily. 01/06/22  Yes [name], Dinah C, NP  melatonin 5 MG TABS Take 5 mg by mouth at bedtime as needed (Sleep).   Yes [provider]  modafinil (PROVIGIL) 200 MG tablet TAKE 1 TABLET(200 MG) BY  MOUTH IN THE MORNING Patient taking differently: Take 200 mg by mouth in the morning. 04/03/22  Yes [name], [name], MD  pantoprazole (PROTONIX) 40 MG tablet Take 1 tablet (40 mg total) by mouth daily. 09/27/21  Yes BranchRoyetta Crochet, MD  rosuvastatin (CRESTOR) 10 MG tablet TAKE 1 TABLET BY MOUTH DAILY Patient taking differently: Take 10 mg by mouth daily. 08/14/21  Yes BranchRoyetta Crochet, MD  nitroGLYCERIN (NITROSTAT) 0.4 MG SL tablet Place 1 tablet (0.4 mg total) under the tongue every 5 (five) minutes as needed for chest pain.  06/16/21   Allie Bossier, MD      Allergies    Statins    Review of Systems   Review of Systems  All other systems reviewed and are negative.   Physical Exam Updated Vital Signs BP (!) 168/75   Pulse 67   Temp 97.6 F (36.4 C) (Oral)   Resp 13   Ht '5\' 2"'$  (1.575 m)   Wt 75.8 kg   SpO2 100%   BMI 30.54 kg/m  Physical Exam Vitals and nursing note reviewed.  Constitutional:      General: She is not in acute distress.    Appearance: Normal appearance.  HENT:     Head: Normocephalic and atraumatic.  Eyes:     General:        Right eye: No discharge.        Left eye: No discharge.  Cardiovascular:     Rate and Rhythm: Normal rate and regular rhythm.     Heart sounds: No murmur heard.    No friction rub. No gallop.  Pulmonary:     Effort: Pulmonary effort is normal.     Breath sounds: Normal breath sounds.  Abdominal:     General: Bowel sounds are normal.     Palpations: Abdomen is soft.  Skin:    General: Skin is warm and dry.     Capillary Refill: Capillary refill takes less than 2 seconds.  Neurological:     Mental Status: She is alert and oriented to person, place, and time.     Comments: Intact strength 5/5 bilateral upper and lower extremities, patient endorses some sensory deficit on right arm compared to the left arm, reporting that it feels heavy, larger than normal but not specifically numb.  Questionable brisk reflexes in right arm compared to left, but no clear changes.  Patient with some impaired coordination to finger-nose-finger which she reports is her baseline.  She is intact extraocular movements, pupils are equal round reactive to light.  Otherwise CN II through XII are grossly intact.  No sensory deficits noted on face, no facial droop.  Psychiatric:        Mood and Affect: Mood normal.        Behavior: Behavior normal.     ED Results / Procedures / Treatments   Labs (all labs ordered are listed, but only abnormal results are displayed) Labs  Reviewed  COMPREHENSIVE METABOLIC PANEL - Abnormal; Notable for the following components:      Result Value   BUN 26 (*)    All other components within normal limits  URINALYSIS, ROUTINE W REFLEX MICROSCOPIC - Abnormal; Notable for the following components:   Hgb urine dipstick SMALL (*)    Leukocytes,Ua LARGE (*)    Bacteria, UA RARE (*)    All other components within normal limits  URINE CULTURE  CBC WITH DIFFERENTIAL/PLATELET    EKG None  Radiology CT Head Wo  Contrast  Result Date: 05/08/2022 CLINICAL DATA:  Headache, new or worsening (Age >= 50y) EXAM: CT HEAD WITHOUT CONTRAST TECHNIQUE: Contiguous axial images were obtained from the base of the skull through the vertex without intravenous contrast. RADIATION DOSE REDUCTION: This exam was performed according to the departmental dose-optimization program which includes automated exposure control, adjustment of the mA and/or kV according to patient size and/or use of iterative reconstruction technique. COMPARISON:  None Available. FINDINGS: Brain: No acute intracranial abnormality. Specifically, no hemorrhage, hydrocephalus, mass lesion, acute infarction, or significant intracranial injury. Vascular: No hyperdense vessel or unexpected calcification. Skull: No acute calvarial abnormality. Sinuses/Orbits: No acute findings Other: No IMPRESSION: No acute intracranial abnormality. Electronically Signed   By: Rolm Baptise M.D.   On: 05/08/2022 23:37    Procedures Procedures    Medications Ordered in ED Medications  acetaminophen (TYLENOL) tablet 650 mg (650 mg Oral Patient Refused/Not Given 05/09/22 9024)    ED Course/ Medical Decision Making/ A&P Clinical Course as of 05/09/22 1043  Fri May 09, 2022  0756 Ar[facility]t -- [facility] [CP]    Clinical Course User Index [CP] Anselmo Pickler, PA-C                           Medical Decision Making Amount and/or Complexity of Data Reviewed Labs: ordered. Radiology:  ordered.  Risk OTC drugs.   This patient is a 76 y.o. female who presents to the ED for concern of ocular migraine, vision blurring, hypertension, odd right arm sensation, this involves an extensive number of treatment options, and is a complaint that carries with it a high risk of complications and morbidity. The emergent differential diagnosis prior to evaluation includes, but is not limited to,  htn, MS progression, stroke, TIA, optic neuritis, less concern for atypical ACS, considered retinal artery hemorrhage, retinal vein hemorrhage, other physical or vascular eye injury, SAH, vs other.   This is not an exhaustive differential.   Past Medical History / Co-morbidities / Social History: secondary progressive multiple sclerosis, hypertension, recent heart failure, stent placement  Additional history: Chart reviewed. Pertinent results include: Reviewed outpatient PT rehab, gastroenterology, cardiology visits  Physical Exam: Physical exam performed. The pertinent findings include: My exam patient with some questionable upper extremity hyperreflexia, she has no visual deficits noted on my exam, no other objective neurologic deficits, and patient reports that her headache is improving at this time.  She has had some uncontrolled blood pressure during her emergency department visit, systolic stable around 097D, diastolic around 70  Lab Tests: I ordered, and personally interpreted labs.  The pertinent results include: CMP overall unremarkable, mildly elevated BUN 26, CBC unremarkable, urinalysis with some small hemoglobin leukocytes, rare bacteria, as well as some red and white blood cells, discussed with patient that although this urine sample does appear questionably infected I cannot convince to treat with antibiotics based on her lack of any symptoms at this time, we will send for urine culture, and treat as needed if culture results positive   Imaging Studies: I ordered imaging studies  including CT head without contrast. I independently visualized and interpreted imaging which showed no acute intracranial abnormality. I agree with the radiologist interpretation.  Consultations Obtained: I requested consultation with the neurologist, Dr. Leonel Ramsay,  and discussed lab and imaging findings as well as pertinent plan - they recommend: Given history of MS, concern for questionable stroke versus TIA related symptoms even in absence of negative head CT  he recommended MRI brain with and without.  Patient however not want to wait to have an MRI at this point, reports that she is no longer having any headache, any visual deficits, still having some questionable odd sensation of the right arm but without any objective weakness.  I do not think that she needs to sign out AMA at this time, but recommend that she follow-up with her neurologist at her earliest convenience for further evaluation.   Disposition: After consideration of the diagnostic results and the patients response to treatment, I feel that patient stable for discharge at this time, recommended PCP, cardiology follow-up for hypertension, neurology follow-up for her nonspecific neurologic symptoms.   emergency department workup does not suggest an emergent condition requiring admission or immediate intervention beyond what has been performed at this time. The plan is: as above. The patient is safe for discharge and has been instructed to return immediately for worsening symptoms, change in symptoms or any other concerns.  I discussed this case with my attending physician Dr. Ashok Cordia who cosigned this note including patient's presenting symptoms, physical exam, and planned diagnostics and interventions. Attending physician stated agreement with plan or made changes to plan which were implemented.    Final Clinical Impression(s) / ED Diagnoses Final diagnoses:  Ocular migraine  Hypertension, unspecified type    Rx / DC Orders ED  Discharge Orders     None         Dorien Chihuahua 05/09/22 1044    Lajean Saver, MD 05/09/22 1136

## 2022-05-09 NOTE — Telephone Encounter (Addendum)
Patient paged after hour answering service with complaint of optical migraine, "feeling strange", R arm heavy and drop in BP. Page says patient is already in [facility] ED, however has not been seen yet and does not know what is going on. Looks like ED PA evaluated the patient in triage. CMP ok. UA shows rare bacteria and large leukocyte. CT of head reassuring.   Attempted the call the patient, went into voice mail, left message. Will try again in 10 min.   Addendum: called the patient a second time, still no answer. Left message for her to contact us again if she still need [place].

## 2022-05-10 LAB — URINE CULTURE

## 2022-05-12 ENCOUNTER — Encounter: Payer: Self-pay | Admitting: Neurology

## 2022-05-12 ENCOUNTER — Encounter: Payer: Self-pay | Admitting: Student

## 2022-05-12 ENCOUNTER — Ambulatory Visit (INDEPENDENT_AMBULATORY_CARE_PROVIDER_SITE_OTHER): Payer: Medicare Other | Admitting: Neurology

## 2022-05-12 ENCOUNTER — Telehealth: Payer: Self-pay | Admitting: Neurology

## 2022-05-12 VITALS — BP 148/70 | HR 65 | Ht 62.0 in | Wt 169.2 lb

## 2022-05-12 DIAGNOSIS — G35 Multiple sclerosis: Secondary | ICD-10-CM | POA: Diagnosis not present

## 2022-05-12 DIAGNOSIS — M2550 Pain in unspecified joint: Secondary | ICD-10-CM

## 2022-05-12 DIAGNOSIS — M545 Low back pain, unspecified: Secondary | ICD-10-CM

## 2022-05-12 DIAGNOSIS — M4306 Spondylolysis, lumbar region: Secondary | ICD-10-CM

## 2022-05-12 DIAGNOSIS — I2510 Atherosclerotic heart disease of native coronary artery without angina pectoris: Secondary | ICD-10-CM

## 2022-05-12 DIAGNOSIS — Z79899 Other long term (current) drug therapy: Secondary | ICD-10-CM

## 2022-05-12 DIAGNOSIS — R269 Unspecified abnormalities of gait and mobility: Secondary | ICD-10-CM | POA: Diagnosis not present

## 2022-05-12 NOTE — Telephone Encounter (Signed)
medicare/[place] NPR sent to GI [phone] 

## 2022-05-12 NOTE — Progress Notes (Signed)
[facility]  PATIENT: Dawn [name] DOB: 01/16/1946  REFERRING CLINICIAN: Aura Dials HISTORY FROM: Patient  REASON FOR VISIT: MS   HISTORICAL  CHIEF COMPLAINT:  Chief Complaint  Patient presents with   Follow-up    RM 1, alone. Last seen 02/05/22.     HISTORY OF PRESENT ILLNESS:  Dawn [name] is a 76 y.o. woman who was diagnosed with multiple sclerosis in 2001.      Update 05/12/2022: She was noting 'ocular migraine' symptoms last week and felt foggy afterwards.   She went to the ED and was there 18 hours.   Her BP was moderately elevated.   She had no chest pain.     She was found to have a UTI.   She took Azo for the UTI.   She slept a long time that night and felt back to baseline.   The UCx returned multiple species.  An MRI was planned but due to feeling better she left and it was not performed.       She is on Leflunomide for Sjogren's and MS.    Eyes are dry but mouth ok   The Plavix ws dc and she feels it also helpsed her eyes.  She is on aspirin  She is on several BP med's:  carvedilol, hydralazone, losartan.        She had a nerve block/RFA for her back and gait is mildly better.   Although back is a bit better, legs are still hurting though.  She was doing Tai Chi and swimming at the [facility] but is now just floating some rather than laps  Walking is painful.    She notes pain is increased by extending her back and decreased by flexion (can walk better with shopping cart).      Her gait is reduced.  She feels balance is poor and also has pain which affects gait.   She feels lightheaded.   Many med's ere changed by cardiology.     She uses a cane due to the right leg giving out at times.   She is noting numbness but less pain in her hands.  At night, she gets painful tingling in her legs.   She has urianry urgency.    She is sleeping poorly with tingling and cramps in legs and also have hot flashes.   Clonazepam helps insomnia some and  spasticity at night as much as it used to.   It helps her fall asleep but then she wakes up 3 hours later.   She feels her cognition is about the same as last visit.      She has had more memory difficulties and is forgetful.     She denies anxiety or depression at this time.   She has fatigue and takes 1 Provigil every morning and sometimes another 1/2 pill in the afternoon.   She generally eats well with a Mediterranean style diet.       _______________________________________________ MS History:    In 1994, she had an episode of severe vertigo with gait ataxia. An MRI at that time was reportedly normal. The symptoms were felt to be due to allergies. About 7 years later she had vertigo, gait ataxia and diplopia. Also her left leg was giving out. Short after that she had a hysterectomy. Postoperatively, she was numb in both legs and she had a lumbar puncture which showed changes consistent with MS. Then, an MRI of the brain was performed  showing changes consistent with MS. She was referred to Dr. Erling Cruz who her on Betaseron. Last year, she switched from Betaseron to Tecfidera, in the hope that she would be able to avoid shots as she was having severe skin reactions with knots.    However, she had a lot of difficulty tolerating Tecfidera and swithced to [place] in early 2016 and to [place] late 2016.  Switched to Leflunomide for insurance reasons in 2019.    MRI brain 09/13/2018 shows multiple T2/flair hyperintense foci in the hemispheres.  The pattern is consistent with chronic demyelinating plaque associated with multiple sclerosis though some foci likely also represent chronic microvascular ischemic change.  None of the foci enhance or appear to be acute and there is no change compared to the 2017 MRI.      Remote left cerebellar infarction.      There is a normal enhancement pattern and no acute findings.  MRI brain 11/17/2019 showed Remote left PICA distribution CVA in the left cerebellar hemisphere.   This appears unchanged compared to the 2020 MRI.      Scattered T2/FLAIR hyperintense foci in the hemispheres.  Some foci are juxtacortical and periventricular consistent with multiple sclerosis though other foci are more nonspecific and could also be seen with chronic microvascular ischemic change.  None of the foci appear to be acute.  There are no new lesions compared to the 09/13/2018 MRI.  MRI cervical spine showed a normal spinal cord.     At C5-C6 there is a right paramedian disc protrusion causing mild right foraminal narrowing and mild spinal stenosis but no nerve root compression.   Minimal degenerative changes at C2-C3 and C3-C4 detailed above that do not lead to spinal stenosis or nerve root compression.    REVIEW OF SYSTEMS:  Constitutional: No fevers, chills, sweats, or change in appetite.   Reports fatigue Eyes: No visual changes, double vision, eye pain Ear, nose and throat: No hearing loss, ear pain, nasal congestion, sore throat Cardiovascular: No chest pain, palpitations Respiratory:  No shortness of breath at rest or with exertion.   No wheezes GastrointestinaI: No nausea, vomiting, diarrhea, abdominal pain, fecal incontinence Genitourinary:  see above. Musculoskeletal:  Pain in left greater than right hip Integumentary: No rash, pruritus.    Neurological: as above Psychiatric: No depression at this time.  No anxiety Endocrine: No palpitations, diaphoresis, change in appetite, change in weigh or increased thirst Hematologic/Lymphatic:  No anemia, purpura, petechiae. Allergic/Immunologic: No itchy/runny eyes, nasal congestion, recent allergic reactions, rashes  ALLERGIES: Allergies  Allergen Reactions   Statins Other (See Comments)    Muscle pain; Tolerates Crestor     HOME MEDICATIONS: Outpatient Medications Prior to Visit  Medication Sig Dispense Refill   acetaminophen (TYLENOL) 500 MG tablet Take 1,000 mg by mouth every 6 (six) hours as needed for mild pain or  moderate pain.     aspirin EC 81 MG tablet Take 81 mg by mouth daily. Swallow whole.     carvedilol (COREG) 6.25 MG tablet Take 1 tablet (6.25 mg total) by mouth 2 (two) times daily with a meal. 180 tablet 3   cholecalciferol (VITAMIN D) 25 MCG (1000 UNIT) tablet Take 2,000 Units by mouth daily.     clonazePAM (KLONOPIN) 0.5 MG tablet TAKE 1 TABLET(0.5 MG) BY MOUTH AT BEDTIME (Patient taking differently: Take 0.5 mg by mouth at bedtime.) 90 tablet 1   fexofenadine (ALLEGRA) 180 MG tablet Take 180 mg by mouth at bedtime.  Flaxseed, Linseed, (FLAXSEED OIL PO) Take 5-10 mLs by mouth 3 (three) times a week. Power     fluticasone (FLONASE) 50 MCG/ACT nasal spray Place 1 spray into both nostrils daily as needed for allergies or rhinitis.     hydrALAZINE (APRESOLINE) 10 MG tablet TAKE 1 TABLET BY MOUTH EVERY 8 HOURS. (Patient taking differently: Take 10 mg by mouth every 8 (eight) hours.) 120 tablet 3   leflunomide (ARAVA) 20 MG tablet TAKE 1 TABLET(20 MG) BY MOUTH DAILY (Patient taking differently: Take 20 mg by mouth daily.) 90 tablet 3   Lidocaine 4 % PTCH Place 1 patch onto the skin daily as needed (mild pain). Remove & Discard patch within 12 hours or as directed by MD     losartan (COZAAR) 100 MG tablet TAKE 1 TABLET(100 MG) BY MOUTH DAILY (Patient taking differently: Take 100 mg by mouth daily.) 90 tablet 2   melatonin 5 MG TABS Take 5 mg by mouth at bedtime as needed (Sleep).     modafinil (PROVIGIL) 200 MG tablet TAKE 1 TABLET(200 MG) BY MOUTH IN THE MORNING (Patient taking differently: Take 200 mg by mouth in the morning.) 90 tablet 1   nitroGLYCERIN (NITROSTAT) 0.4 MG SL tablet Place 1 tablet (0.4 mg total) under the tongue every 5 (five) minutes as needed for chest pain. 30 tablet 0   pantoprazole (PROTONIX) 40 MG tablet Take 1 tablet (40 mg total) by mouth daily. 30 tablet 11   rosuvastatin (CRESTOR) 10 MG tablet TAKE 1 TABLET BY MOUTH DAILY (Patient taking differently: Take 10 mg by mouth  daily.) 90 tablet 3   solifenacin (VESICARE) 5 MG tablet Take 5 mg by mouth daily.     No facility-administered medications prior to visit.    PAST MEDICAL HISTORY: Past Medical History:  Diagnosis Date   Allergy    CAD (coronary artery disease)    s/p DES to RCA in 06/2021   Dry eyes    Endometrial polyp    Essential hypertension 08/25/2018   GERD (gastroesophageal reflux disease)    History of hiatal hernia    History of kidney stones    Hot flashes, menopausal 10/13/2011   Estradiol started    Hyperlipidemia    Jaundice as teenager   no problems since   Memory loss 09/21/2019     1/2 of feet numb all the time   Multiple sclerosis ([place]) dx 2001   Neuropathy    bilateral feet   Osteoarthritis    Sjogren's syndrome ([place]) dx oct 2021   sore muscvles, dry mouth and eyes   Stroke ([place])    Vertigo    Vision abnormalities     PAST SURGICAL HISTORY: Past Surgical History:  Procedure Laterality Date   ABDOMINAL HYSTERECTOMY  2002   partial   COLONOSCOPY  01/27/2022   2 day prep   colonscopy  2011   CORONARY STENT INTERVENTION N/A 07/18/2021   Procedure: CORONARY STENT INTERVENTION;  Surgeon: Jettie Booze, MD;  Location: [facility];  Service: Cardiovascular;  Laterality: N/A;   DILATATION & CURETTAGE/HYSTEROSCOPY WITH MYOSURE N/A 06/08/2020   Procedure: DILATATION & CURETTAGE/HYSTEROSCOPY/Polypectomy WITH MYOSURE;  Surgeon: Armandina Stammer, DO;  Location: [place];  Service: Gynecology;  Laterality: N/A;   LEFT HEART CATH AND CORONARY ANGIOGRAPHY N/A 07/18/2021   Procedure: LEFT HEART CATH AND CORONARY ANGIOGRAPHY;  Surgeon: Jettie Booze, MD;  Location: [facility];  Service: Cardiovascular;  Laterality: N/A;   LUMBAR FUSION  2001   TOTAL HIP ARTHROPLASTY Right 01/17/2021   Procedure: TOTAL HIP ARTHROPLASTY ANTERIOR APPROACH;  Surgeon: Rod Can, MD;  Location: [facility];  Service: Orthopedics;  Laterality: Right;    UPPER GI ENDOSCOPY  yrs ago    FAMILY HISTORY: Family History  Problem Relation Age of Onset   Heart failure Mother    Heart failure Father    Arthritis Other    Hypertension Other    Colon cancer Neg Hx    Colon polyps Neg Hx    Esophageal cancer Neg Hx    Stomach cancer Neg Hx    Rectal cancer Neg Hx     SOCIAL HISTORY:  Social History   Socioeconomic History   Marital status: Unknown    Spouse name: Not on file   Number of children: Not on file   Years of education: Not on file   Highest education level: Not on file  Occupational History   Occupation: retired  Tobacco Use   Smoking status: Former    Packs/day: 0.50    Years: 15.00    Total pack years: 7.50    Types: Cigarettes    Quit date: 08/05/1983    Years since quitting: 38.7   Smokeless tobacco: Never  Vaping Use   Vaping Use: Never used  Substance and Sexual Activity   Alcohol use: Yes    Comment: rarely, maybe yearly drink   Drug use: Never   Sexual activity: Not Currently    Birth control/protection: Post-menopausal  Other Topics Concern   Not on file  Social History Narrative   Regular Exercise-no   Widowed   Retired   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     Tobacco use, amount per day now: former   Past tobacco use, amount per day: 1/2-1 packet   How many years did you use tobacco: 12 years   Alcohol use (drinks per week): 0   Diet: mediterranean based   Do you drink/eat things with caffeine: yes   Marital status:        widow                          What year were you married? 1987   Do you live in a house, apartment, assisted living, condo, trailer, etc.? house   Is it one or more stories? 1 story   How many persons live in your home? 1   Do you have pets in your home?( please list) no   Current or past profession: Catering manager    Do you exercise?          yes                        Type and how often? Tai chi and pt therapy, stretches etc....   Do you have a living will? yes   Do you have a DNR form?      no                             If not, do you want to discuss one? yes   Do you have signed POA/HPOA forms?      no  If so, please bring to you appointment   Social Determinants of Health   Financial Resource Strain: Not on file  Food Insecurity: No Food Insecurity (01/07/2022)   Hunger Vital Sign    Worried About Running Out of Food in the Last Year: Never true    Ran Out of Food in the Last Year: Never true  Transportation Needs: No Transportation Needs (01/07/2022)   PRAPARE - Hydrologist (Medical): No    Lack of Transportation (Non-Medical): No  Physical Activity: Not on file  Stress: Not on file  Social Connections: Not on file  Intimate Partner Violence: Not At Risk (02/03/2022)   Humiliation, Afraid, Rape, and Kick questionnaire    Fear of Current or Ex-Partner: No    Emotionally Abused: No    Physically Abused: No    Sexually Abused: No     PHYSICAL EXAM  Vitals:   05/12/22 1253  BP: (!) 148/70  Pulse: 65  SpO2: 97%  Weight: 169 lb 3.2 oz (76.7 kg)  Height: _0  (1.575 m)    Body mass index is 30.95 kg/m.   General: The patient is well-developed and well-nourished and in no acute distress  Musculoskeletal: She has mild lumbar tenderness and mild trochanteric bursa tenderness.  Neurologic Exam  Mental status: The patient is alert and oriented x 3 at the time of the examination.   apparently normal attention span, stm and concentration ability.   Speech is normal.      Cranial nerves: Extraocular movements are full.  Facial strength and sensory are normal.  Hearing appeared to be symmetric  Motor:  Muscle bulk is normal but tone is symmetric, more on the left.. Strength is 4/5 in median innervated and 4+/5 and ulnar  innervated intrinsic hand muscles   Sensory:   Sensory testing is intact to touch and vibration in the arms.  She has reduced vibration sensation in the feet.  Coordination: Finger-nose-finger and heel-to-shin is performed better on the right than the left.  Gait and station: Station is normal.  The gait is wide.  Tandem gait is poor.  Romberg is negative. Reflexes: Deep tendon reflexes are symmetric and normal bilaterally.      DIAGNOSTIC DATA (LABS, IMAGING, TESTING) - I reviewed patient records, labs, notes, testing and imaging myself where available.  Lab Results  Component Value Date   WBC 6.1 05/09/2022   HGB 13.1 05/09/2022   HCT 40.5 05/09/2022   MCV 93.5 05/09/2022   PLT 198 05/09/2022      Component Value Date/Time   NA 137 05/09/2022 0014   NA 138 07/12/2021 1031   K 3.8 05/09/2022 0014   CL 107 05/09/2022 0014   CO2 23 05/09/2022 0014   GLUCOSE 96 05/09/2022 0014   BUN 26 (H) 05/09/2022 0014   BUN 16 07/12/2021 1031   CREATININE 0.68 05/09/2022 0014   CREATININE 0.69 04/18/2022 0845   CALCIUM 9.2 05/09/2022 0014   PROT 6.7 05/09/2022 0014   PROT 6.1 10/02/2020 1607   ALBUMIN 4.1 05/09/2022 0014   ALBUMIN 4.2 10/02/2020 1607   AST 23 05/09/2022 0014   AST 20 03/09/2018 1342   ALT 19 05/09/2022 0014   ALT 22 03/09/2018 1342   ALKPHOS 104 05/09/2022 0014   BILITOT 0.5 05/09/2022 0014   BILITOT <0.2 10/02/2020 1607   BILITOT 0.4 03/09/2018 1342   GFRNONAA >60 05/09/2022 0014   GFRNONAA 91 01/25/2021 1527   GFRAA 105 01/25/2021 1527  Lab Results  Component Value Date   CHOL 146 04/18/2022   HDL 44 (L) 04/18/2022   LDLCALC 78 04/18/2022   LDLDIRECT 130.2 11/03/2011   TRIG 144 04/18/2022   CHOLHDL 3.3 04/18/2022   Lab Results  Component Value Date   HGBA1C 5.4 06/15/2021   No results found for: "VITAMINB12" Lab Results  Component Value Date   TSH 5.41 (H) 04/18/2022       ASSESSMENT AND PLAN  Multiple sclerosis ([place]) - Plan: MR BRAIN WO  CONTRAST  High risk medication use  Abnormality of gait  Multiple joint pain  Midline low back pain without sciatica, unspecified chronicity  Lumbar spondylolysis   1.    Continue leflunomide for MS and Sjogren's.  Normal lymphocyte count last week.   She has been on leflunomide for many years   We will check MRI brain for subclinical progression.  2.    Continue exercises physical therapy.   3.    Continue  clonazepam and modafinil.     4.    Continue seeing Dr. Ron Agee for lumbar facet syndrome 5.    Return to see me in about 6 months but is advised to call sooner if she has new or worsening neurologic symptoms.  40-minute office visit with the majority of the time spent face-to-face for history and physical, discussion/counseling and decision-making.  Additional time with record review (including recent ED visit/labs/imaging) and documentation.    Lindsea Olivar A. Felecia Shelling, MD, PhD 77/09/4033, 2:48 PM Certified in Neurology, Clinical Neurophysiology, Sleep Medicine, Pain Medicine and Neuroimaging  [facility] [address] [place],  [address] [phone] \

## 2022-05-12 NOTE — Telephone Encounter (Signed)
Called pt back.  Pt reports she is not sure what caused sx that brought her to the ER.  She was having ocular migraine. Felt "fuzzy" during episode. Friend brought her to ER. They did lab work, UA, CT. UA abnormal but they did not start her on any antibiotics. She is taking AZO OTC.  Denies any other signs/sx of illness/infection.  BP was elevated and has remained elevated. This am, BP looked ok.She is waiting to hear back from cardiologist.   She would like to come in for evaluation with Dr. Felecia Shelling. Scheduled work in visit for 05/12/22 at 1:30pm with Dr. Felecia Shelling. Asked she check in by 1:00pm.

## 2022-05-12 NOTE — Progress Notes (Signed)
 Cardiology Office Note:    Date:  05/13/2022   ID:  Dawn [name], DOB 11/12/1945, MRN [id number]  PCP:  [name], Dinah C, NP  Cardiologist:  Branch, Mary E, MD  Electrophysiologist:  None   Referring MD: [name], Dinah C, NP   Chief Complaint: follow-up of hypertension   History of Present Illness:    Dawn [name] is a 76 y.o. female with a history of CAD s/p DES to RCA in 06/2021, hypertension, hyperlipidemia, GERD, CVA, multiple sclerosis, ocular migraines, and vertigo who is followed by Dr. Branch and presents today for follow-up of hypertension.  Patient was admitted in 06/2021 with chest pain and elevated troponin in the setting of hypertensive emergency with systolic BP as high as 210 at home.. High-sensitivity troponin was mildly elevated and peaked at 387 which was felt to be consistent with demand ischemia.  Echo showed LVEF of 60 to 65% with no regional wall motion abnormalities, mild LVH, grade 1 diastolic dysfunction, mild MR.  She was initially thought to have a component of diastolic CHF but when evaluated by Cardiology felt to just be due to hypertensive emergency.  She was started on Coreg and Hydralazine. She was referred to Cardiology as an outpatient and seen by Dr. Mary Branch later that month at which time she reported feeling lightheaded and tired but no recurrent chest pain and no shortness of breath.  Coronary CTA was ordered for further evaluation and showed coronary calcium score of 141 (71st percentile for age and sex) with severe stenosis of the left main. Therefore, she underwent left cardiac catheterization on 07/18/2021 which showed 89% stenosis of proximal to mid RCA with left-to-right collaterals which was successfully treated with DES. Otherwise, cath only showed mild disease of proximal to mid LAD and mid LCX.  She was initially started on Plavix but was then switched to Brilinta due to a drug interaction with Modafinil. However, it looks like patient  ran into problems with the cost of Brilinta and was put back on Plavix.  Patient was last seen by Dr. Branch on 04/01/2022 at which time she was doing well with no chest pain or dyspnea on exertion. Plavix was stopped at that visit given she was >6 months out from PCI.  Patient was recently seen in the ED on 05/08/2022 with concerns for elevated BP and some neurologic symptoms (left sided eye pain and vision changes which felt like prior optical migraine as well as some sensory deficits and heaviness of right arm). BP was 168/75 in the ED. She denied any chest pain or shortness of breath. Head CT showed no acute findings. Case was reviewed with who had concerns for questionable stroke vs TIA even in setting of negative head CT given history of multiple sclerosis. Brain MRI was recommended but patient preferred to go home and symptoms had improved so she was advised to follow-up with Neurology as soon as possible. She was also advised to follow-up with Cardiology in regards to her hypertension. She was seen by Dr. [name] (Neurology) on 05/12/2022 for follow-up and outpatient brain MRI was ordered.  Patient presents today for follow-up. BP mildly elevated in the office today in the 140s/70s. She states her systolic BP used to run in the 120s to 130s. She brings in a log of her BP today and it is mostly above goal of 130/80. She is otherwise doing well from a cardiac standpoint. She denies any chest pain or shortness of breath. No orthopnea, PND, lower   extremity edema. No palpitations. She has intermittent dizziness that she notices with dizziness or turning. She thought this may be due to allergies and starting taking Flonase with improvement. She also states she has permanent vertigo due to her multiple sclerosis and cannot turn to the left when laying down or she will have dizziness with this. She also reports intermittent brief "foginess" which she states she does not usually have with her multiple sclerosis.  His systolic BP was lower than normal this morning at 106 and she felt a little fogy with this but this did not last long. No falls or syncope. Patient also states she has chronic pain from her multiple sclerosis and does note that her BP is higher when she is having a lot of pain.  Of note, she also states she was diagnosed with a UTI at recent ED visit but was not prescribed antibiotics and has just been using home remedies. She was initially having dysuria but this improved with the home remedies. She was wondering if she needed to start antibiotics for this. She has had multiple UTIs in the past and states she forgot to asked Dr. Felecia Shelling about this at her appointment yesterday. Recommended calling his office and checking with him.  Past Medical History:  Diagnosis Date   Allergy    CAD (coronary artery disease)    s/p DES to RCA in 06/2021   Dry eyes    Endometrial polyp    Essential hypertension 08/25/2018   GERD (gastroesophageal reflux disease)    History of hiatal hernia    History of kidney stones    Hot flashes, menopausal 10/13/2011   Estradiol started    Hyperlipidemia    Jaundice as teenager   no problems since   Memory loss 09/21/2019     1/2 of feet numb all the time   Multiple sclerosis ([place]) dx 2001   Neuropathy    bilateral feet   Osteoarthritis    Sjogren's syndrome ([place]) dx oct 2021   sore muscvles, dry mouth and eyes   Stroke [facility])    Vertigo    Vision abnormalities     Past Surgical History:  Procedure Laterality Date   ABDOMINAL HYSTERECTOMY  2002   partial   COLONOSCOPY  01/27/2022   2 day prep   colonscopy  2011   CORONARY STENT INTERVENTION N/A 07/18/2021   Procedure: CORONARY STENT INTERVENTION;  Surgeon: Jettie Booze, MD;  Location: [place] [facility];  Service: Cardiovascular;  Laterality: N/A;   DILATATION & CURETTAGE/HYSTEROSCOPY WITH MYOSURE N/A 06/08/2020   Procedure: DILATATION & CURETTAGE/HYSTEROSCOPY/Polypectomy WITH MYOSURE;   Surgeon: Armandina Stammer, DO;  Location: [place];  Service: Gynecology;  Laterality: N/A;   LEFT HEART CATH AND CORONARY ANGIOGRAPHY N/A 07/18/2021   Procedure: LEFT HEART CATH AND CORONARY ANGIOGRAPHY;  Surgeon: Jettie Booze, MD;  Location: [place] [facility];  Service: Cardiovascular;  Laterality: N/A;   LUMBAR FUSION  2001   TOTAL HIP ARTHROPLASTY Right 01/17/2021   Procedure: TOTAL HIP ARTHROPLASTY ANTERIOR APPROACH;  Surgeon: Rod Can, MD;  Location: [facility];  Service: Orthopedics;  Laterality: Right;   UPPER GI ENDOSCOPY  yrs ago    Current Medications: Current Meds  Medication Sig   acetaminophen (TYLENOL) 500 MG tablet Take 1,000 mg by mouth every 6 (six) hours as needed for mild pain or moderate pain.   aspirin EC 81 MG tablet Take 81 mg by mouth daily. Swallow whole.   carvedilol (COREG)  6.25 MG tablet Take 1 tablet (6.25 mg total) by mouth 2 (two) times daily with a meal.   cholecalciferol (VITAMIN D) 25 MCG (1000 UNIT) tablet Take 2,000 Units by mouth daily.   clonazePAM (KLONOPIN) 0.5 MG tablet TAKE 1 TABLET(0.5 MG) BY MOUTH AT BEDTIME (Patient taking differently: Take 0.5 mg by mouth at bedtime.)   fexofenadine (ALLEGRA) 180 MG tablet Take 180 mg by mouth at bedtime.    Flaxseed, Linseed, (FLAXSEED OIL PO) Take 5-10 mLs by mouth 3 (three) times a week. Power   fluticasone (FLONASE) 50 MCG/ACT nasal spray Place 1 spray into both nostrils daily as needed for allergies or rhinitis.   leflunomide (ARAVA) 20 MG tablet TAKE 1 TABLET(20 MG) BY MOUTH DAILY (Patient taking differently: Take 20 mg by mouth daily.)   Lidocaine 4 % PTCH Place 1 patch onto the skin daily as needed (mild pain). Remove & Discard patch within 12 hours or as directed by MD   losartan (COZAAR) 100 MG tablet TAKE 1 TABLET(100 MG) BY MOUTH DAILY (Patient taking differently: Take 100 mg by mouth daily.)   melatonin 5 MG TABS Take 5 mg by mouth at bedtime as needed (Sleep).    modafinil (PROVIGIL) 200 MG tablet TAKE 1 TABLET(200 MG) BY MOUTH IN THE MORNING (Patient taking differently: Take 200 mg by mouth in the morning.)   nitroGLYCERIN (NITROSTAT) 0.4 MG SL tablet Place 1 tablet (0.4 mg total) under the tongue every 5 (five) minutes as needed for chest pain.   pantoprazole (PROTONIX) 40 MG tablet Take 1 tablet (40 mg total) by mouth daily.   rosuvastatin (CRESTOR) 10 MG tablet TAKE 1 TABLET BY MOUTH DAILY (Patient taking differently: Take 10 mg by mouth daily.)   solifenacin (VESICARE) 5 MG tablet Take 5 mg by mouth daily.   [DISCONTINUED] hydrALAZINE (APRESOLINE) 10 MG tablet TAKE 1 TABLET BY MOUTH EVERY 8 HOURS. (Patient taking differently: Take 10 mg by mouth every 8 (eight) hours.)     Allergies:   Statins   Social History   Socioeconomic History   Marital status: Unknown    Spouse name: Not on file   Number of children: Not on file   Years of education: Not on file   Highest education level: Not on file  Occupational History   Occupation: retired  Tobacco Use   Smoking status: Former    Packs/day: 0.50    Years: 15.00    Total pack years: 7.50    Types: Cigarettes    Quit date: 08/05/1983    Years since quitting: 38.7   Smokeless tobacco: Never  Vaping Use   Vaping Use: Never used  Substance and Sexual Activity   Alcohol use: Yes    Comment: rarely, maybe yearly drink   Drug use: Never   Sexual activity: Not Currently    Birth control/protection: Post-menopausal  Other Topics Concern   Not on file  Social History Narrative   Regular Exercise-no   Widowed   Retired   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     Tobacco use, amount per day now: former   Past tobacco use, amount per day: 1/2-1 packet   How many years did you use tobacco: 12 years   Alcohol use (drinks per week): 0   Diet:  mediterranean based  Do you drink/eat things with caffeine: yes   Marital status:        widow                          What year were you married? 1987   Do you live in a house, apartment, assisted living, condo, trailer, etc.? house   Is it one or more stories? 1 story   How many persons live in your home? 1   Do you have pets in your home?( please list) no   Current or past profession: teacher and arts administrator    Do you exercise?          yes                        Type and how often? Tai chi and pt therapy, stretches etc....   Do you have a living will? yes   Do you have a DNR form?      no                             If not, do you want to discuss one? yes   Do you have signed POA/HPOA forms?      no                  If so, please bring to you appointment   Social Determinants of Health   Financial Resource Strain: Not on file  Food Insecurity: No Food Insecurity (01/07/2022)   Hunger Vital Sign    Worried About Running Out of Food in the Last Year: Never true    Ran Out of Food in the Last Year: Never true  Transportation Needs: No Transportation Needs (01/07/2022)   PRAPARE - Transportation    Lack of Transportation (Medical): No    Lack of Transportation (Non-Medical): No  Physical Activity: Not on file  Stress: Not on file  Social Connections: Not on file     Family History: The patient's family history includes Arthritis in an other family member; Heart failure in her father and mother; Hypertension in an other family member. There is no history of Colon cancer, Colon polyps, Esophageal cancer, Stomach cancer, or Rectal cancer.  ROS:   Please see the history of present illness.     EKGs/Labs/Other Studies Reviewed:    The following studies were reviewed:  Echocardiogram 06/15/2021: Impressions:  1. Left ventricular ejection fraction, by estimation, is 60 to 65%. The  left ventricle has normal function. The left ventricle has no regional  wall motion  abnormalities. There is mild left ventricular hypertrophy.  Left ventricular diastolic parameters  are consistent with Grade I diastolic dysfunction (impaired relaxation).   2. Right ventricular systolic function is normal. The right ventricular  size is normal. Tricuspid regurgitation signal is inadequate for assessing  PA pressure.   3. The mitral valve is grossly normal. Mild mitral valve regurgitation.  No evidence of mitral stenosis.   4. The aortic valve is grossly normal. There is mild calcification of the  aortic valve. Aortic valve regurgitation is not visualized. No aortic  stenosis is present.   5. The inferior vena cava is normal in size with greater than 50%  respiratory variability, suggesting right atrial pressure of 3 mmHg. _______________  Coronary CTA 07/09/2021: Impressions: 1. Coronary calcium score of 141. This was 71st percentile for age, sex,   and race matched control. 2. Normal coronary origin with right dominance. 3. CAD-RADS 4 Severe stenosis. (70-99% or > 50% left main). Cardiac catheterization or CT FFR is recommended. Consider symptom-guided anti-ischemic pharmacotherapy as well as risk factor modification per guideline directed care. _______________  Left Cardiac Catheterization 07/18/2021:   Mid Cx lesion is 25% stenosed.   Prox LAD to Mid LAD lesion is 25% stenosed.   Prox RCA to Mid RCA lesion is 99% stenosed.  Left-to-right collaterals.   A drug-eluting stent was successfully placed using a STENT ONYX FRONTIER 3.0X30, postdilated to 3.5 mm.   Post intervention, there is a 0% residual stenosis.   The left ventricular systolic function is normal.   LV end diastolic pressure is normal.   The left ventricular ejection fraction is 55-65% by visual estimate.   There is no aortic valve stenosis.   Successful PCI of the mid RCA.  Brisk left to right collaterals.  It is likely that she had a ruptured plaque in November and developed collaterals since that  time.  Continue aggressive secondary prevention.  Plavix was used for antiplatelet therapy in the Cath Lab.  We will need to see with her other multiple sclerosis medicines, if Brilinta may be a better choice due to drug interactions.  We will also have to check on which statin would not cause drug interaction.   Of note, she had significant right radial vasospasm.  This required additional verapamil and nitroglycerin in the radial artery.  We also used a heating pad on her right forearm.  She required 4 mg of Versed and 100 mg of fentanyl for sedation and this also seemed to help with the vasospasm.   Addendum: After review with the Pharm.D., Brilinta would be a better choice than clopidogrel.  We will send her home with a supply of Brilinta after 180 mg loading dose.  Diagnostic Dominance: Right  Intervention     EKG:  EKG not ordered today.   Recent Labs: 06/16/2021: Magnesium 2.3 04/18/2022: TSH 5.41 05/09/2022: ALT 19; BUN 26; Creatinine, Ser 0.68; Hemoglobin 13.1; Platelets 198; Potassium 3.8; Sodium 137  Recent Lipid Panel    Component Value Date/Time   CHOL 146 04/18/2022 0845   CHOL 138 09/03/2021 0959   TRIG 144 04/18/2022 0845   HDL 44 (L) 04/18/2022 0845   HDL 39 (L) 09/03/2021 0959   CHOLHDL 3.3 04/18/2022 0845   VLDL 27 06/15/2021 0256   LDLCALC 78 04/18/2022 0845   LDLDIRECT 130.2 11/03/2011 0920    Physical Exam:    Vital Signs: BP (!) 142/68 (BP Location: Left Arm, Patient Position: Sitting)   Pulse 63   Ht 5' 2" (1.575 m)   Wt 168 lb (76.2 kg)   SpO2 97%   BMI 30.73 kg/m     Wt Readings from Last 3 Encounters:  05/13/22 168 lb (76.2 kg)  05/12/22 169 lb 3.2 oz (76.7 kg)  05/08/22 167 lb (75.8 kg)     General: 76 y.o. Caucasian female in no acute distress. HEENT: Normocephalic and atraumatic. Sclera clear. EOMs intact. Neck: Supple. No carotid bruits. No JVD. Heart: RRR. Distinct S1 and S2. No murmurs, gallops, or rubs. Radial pulses 2+ and equal  bilaterally. Lungs: No increased work of breathing. Clear to ausculation bilaterally. No wheezes, rhonchi, or rales.  Abdomen: Soft, non-distended, and non-tender to palpation.  Extremities: No lower extremity edema.    Skin: Warm and dry. Neuro: Alert and oriented x3. No focal deficits. Psych: Normal affect. Responds  appropriately.  Assessment:    1. Primary hypertension   2. Coronary artery disease involving native coronary artery of native heart without angina pectoris   3. Hyperlipidemia, unspecified hyperlipidemia type     Plan:    Hypertension BP mildly elevated in the office today. Initially 148/72 and then 142/68 on my personal recheck at the end of visit. This is consistent with what it has been running at home recently. - Current medications: Coreg 6.29m twice daily, Losartan 1082mdaily, and Hydralazine 1092mhree times daily. Will increase Hydralazine to 73m72mree times daily. Continue current doses of Coreg and Losartan. - Asked patient to keep a log of her BP and HR for 2 weeks and then send this to us. Koreavised patient to let us kKoreaw if BP is dropping too low with this change and she is having symptoms with this such as increased "foginess", lightheadedness/dizziness, etc.  CAD S/p DES to RCA in 06/2021.  - No angina. - Continue aspirin, beta-blocker, and statin.  Hyperlipidemia Recent lipid panel on 04/18/2022: Total Cholesterol 146, Triglycerides 144, HDL 44, LDL 78. LDL goal <70 given CAD. - Currently on Crestor 10mg76mly.  - Discussed increasing Crestor or adding Zetia to help get LDL at goal. However, patient already has chronic muscle aches with her multiple sclerosis and she thinks the Crestor makes this slightly worse. She also really does not want to be on any other medications at this time. Given she is close to goal, OK to continue current dose of Crestor for now. It sounds like she already follows a Mediterranean diet and advised her to continue with this.  May need to consider PCSK9 inhibitor in the future.   Disposition: Follow up in 1 year.   Medication Adjustments/Labs and Tests Ordered: Current medicines are reviewed at length with the patient today.  Concerns regarding medicines are outlined above.  No orders of the defined types were placed in this encounter.  Meds ordered this encounter  Medications   hydrALAZINE (APRESOLINE) 25 MG tablet    Sig: Take 1 tablet (25 mg total) by mouth 3 (three) times daily.    Dispense:  360 tablet    Refill:  2    Patient Instructions  Medication Instructions:  Increase Hydralazine to 25 three times daily (let us knKorea if the blood pressure stays above 130/80 or if it drops to low with symptoms)   *If you need a refill on your cardiac medications before your next appointment, please call your pharmacy*   Follow-Up: At [facility] and your health needs are our priority.  As part of our continuing mission to provide you with exceptional heart care, we have created designated Provider Care Teams.  These Care Teams include your primary Cardiologist (physician) and Advanced Practice Providers (APPs -  Physician Assistants and Nurse Practitioners) who all work together to provide you with the care you need, when you need it.  We recommend signing up for the patient portal called "MyChart".  Sign up information is provided on this After Visit Summary.  MyChart is used to connect with patients for Virtual Visits (Telemedicine).  Patients are able to view lab/test results, encounter notes, upcoming appointments, etc.  Non-urgent messages can be sent to your provider as well.   To learn more about what you can do with MyChart, go to [email or web address] next appointment:   12 month(s)  The format for your next appointment:   In Person  Provider:  Branch, Mary E, MD          Signed, Callie E Goodrich, PA-C  05/13/2022 9:29 AM    [facility] 

## 2022-05-13 ENCOUNTER — Telehealth: Payer: Self-pay | Admitting: Neurology

## 2022-05-13 ENCOUNTER — Ambulatory Visit: Payer: Medicare Other | Attending: Student | Admitting: Student

## 2022-05-13 ENCOUNTER — Encounter: Payer: Self-pay | Admitting: Student

## 2022-05-13 VITALS — BP 142/68 | HR 63 | Ht 62.0 in | Wt 168.0 lb

## 2022-05-13 DIAGNOSIS — I2510 Atherosclerotic heart disease of native coronary artery without angina pectoris: Secondary | ICD-10-CM | POA: Insufficient documentation

## 2022-05-13 DIAGNOSIS — E785 Hyperlipidemia, unspecified: Secondary | ICD-10-CM | POA: Diagnosis not present

## 2022-05-13 DIAGNOSIS — I10 Essential (primary) hypertension: Secondary | ICD-10-CM | POA: Insufficient documentation

## 2022-05-13 MED ORDER — SULFAMETHOXAZOLE-TRIMETHOPRIM 800-160 MG PO TABS: 1.0000 | ORAL_TABLET | Freq: Two times a day (BID) | ORAL | 0 refills | Status: DC

## 2022-05-13 MED ORDER — HYDRALAZINE HCL 25 MG PO TABS: 25.0000 mg | ORAL_TABLET | Freq: Three times a day (TID) | ORAL | 2 refills | Status: DC

## 2022-05-13 NOTE — Telephone Encounter (Signed)
Called the patient back. She states at the visit yesterday her and Dr Felecia Shelling talked about a antibiotic and he mentioned checking with cardiologist. Her cardiologist  She states that cardiologist agreed that she should take Abx. I will ask Dr Felecia Shelling what he would recommend prescribing and confirmed that we would send to the [facility] on [address].  Received a VO from Dr Felecia Shelling for bactrim DS for the patient to take twice a day for 1 week. 14 tablets. Will send to the pharmacy for the patient

## 2022-05-13 NOTE — Patient Instructions (Signed)
Medication Instructions:  Increase Hydralazine to 25 three times daily (let us know if the blood pressure stays above 130/80 or if it drops to low with symptoms)   *If you need a refill on your cardiac medications before your next appointment, please call your pharmacy*   Follow-Up: At [facility], you and your health needs are our priority.  As part of our continuing mission to provide you with exceptional heart care, we have created designated Provider Care Teams.  These Care Teams include your primary Cardiologist (physician) and Advanced Practice Providers (APPs -  Physician Assistants and Nurse Practitioners) who all work together to provide you with the care you need, when you need it.  We recommend signing up for the patient portal called "MyChart".  Sign up information is provided on this After Visit Summary.  MyChart is used to connect with patients for Virtual Visits (Telemedicine).  Patients are able to view lab/test results, encounter notes, upcoming appointments, etc.  Non-urgent messages can be sent to your provider as well.   To learn more about what you can do with MyChart, go to [email or web address].    Your next appointment:   12 month(s)  The format for your next appointment:   In Person  Provider:   Janina Mayo, MD

## 2022-05-13 NOTE — Telephone Encounter (Signed)
Pt called needing to speak to the provider or RN regarding medication for a UTI. Please advise.

## 2022-05-14 ENCOUNTER — Other Ambulatory Visit: Payer: Self-pay

## 2022-05-14 ENCOUNTER — Emergency Department (HOSPITAL_COMMUNITY)
Admission: EM | Admit: 2022-05-14 | Discharge: 2022-05-15 | Discharge status: Against Medical Advice | Payer: Medicare Other | Attending: Emergency Medicine | Admitting: Emergency Medicine

## 2022-05-14 ENCOUNTER — Telehealth: Payer: Self-pay | Admitting: Internal Medicine

## 2022-05-14 ENCOUNTER — Encounter (HOSPITAL_COMMUNITY): Payer: Self-pay

## 2022-05-14 ENCOUNTER — Emergency Department (HOSPITAL_COMMUNITY): Payer: Medicare Other

## 2022-05-14 DIAGNOSIS — R200 Anesthesia of skin: Secondary | ICD-10-CM | POA: Diagnosis not present

## 2022-05-14 DIAGNOSIS — M3500 Sicca syndrome, unspecified: Secondary | ICD-10-CM | POA: Insufficient documentation

## 2022-05-14 DIAGNOSIS — R9431 Abnormal electrocardiogram [ECG] [EKG]: Secondary | ICD-10-CM | POA: Diagnosis not present

## 2022-05-14 DIAGNOSIS — R202 Paresthesia of skin: Secondary | ICD-10-CM | POA: Diagnosis not present

## 2022-05-14 DIAGNOSIS — I6782 Cerebral ischemia: Secondary | ICD-10-CM | POA: Insufficient documentation

## 2022-05-14 DIAGNOSIS — G35 Multiple sclerosis: Secondary | ICD-10-CM | POA: Insufficient documentation

## 2022-05-14 DIAGNOSIS — Z5321 Procedure and treatment not carried out due to patient leaving prior to being seen by health care provider: Secondary | ICD-10-CM | POA: Insufficient documentation

## 2022-05-14 DIAGNOSIS — I2510 Atherosclerotic heart disease of native coronary artery without angina pectoris: Secondary | ICD-10-CM | POA: Insufficient documentation

## 2022-05-14 DIAGNOSIS — I10 Essential (primary) hypertension: Secondary | ICD-10-CM | POA: Insufficient documentation

## 2022-05-14 DIAGNOSIS — Z8673 Personal history of transient ischemic attack (TIA), and cerebral infarction without residual deficits: Secondary | ICD-10-CM | POA: Diagnosis not present

## 2022-05-14 LAB — CBC
HCT: 39.7 % (ref 36.0–46.0)
Hemoglobin: 12.8 g/dL (ref 12.0–15.0)
MCH: 30 pg (ref 26.0–34.0)
MCHC: 32.2 g/dL (ref 30.0–36.0)
MCV: 93 fL (ref 80.0–100.0)
Platelets: 183 10*3/uL (ref 150–400)
RBC: 4.27 MIL/uL (ref 3.87–5.11)
RDW: 14.4 % (ref 11.5–15.5)
WBC: 4.5 10*3/uL (ref 4.0–10.5)
nRBC: 0 % (ref 0.0–0.2)

## 2022-05-14 LAB — COMPREHENSIVE METABOLIC PANEL
ALT: 14 U/L (ref 0–44)
AST: 21 U/L (ref 15–41)
Albumin: 4 g/dL (ref 3.5–5.0)
Alkaline Phosphatase: 93 U/L (ref 38–126)
Anion gap: 9 (ref 5–15)
BUN: 19 mg/dL (ref 8–23)
CO2: 21 mmol/L — ABNORMAL LOW (ref 22–32)
Calcium: 9.4 mg/dL (ref 8.9–10.3)
Chloride: 109 mmol/L (ref 98–111)
Creatinine, Ser: 0.91 mg/dL (ref 0.44–1.00)
GFR, Estimated: >60 mL/min (ref >60)
Glucose, Bld: 77 mg/dL (ref 70–99)
Potassium: 3.9 mmol/L (ref 3.5–5.1)
Sodium: 139 mmol/L (ref 135–145)
Total Bilirubin: 0.5 mg/dL (ref 0.3–1.2)
Total Protein: 6.3 g/dL — ABNORMAL LOW (ref 6.5–8.1)

## 2022-05-14 LAB — I-STAT CHEM 8, ED
BUN: 20 mg/dL (ref 8–23)
Calcium, Ion: 1.02 mmol/L — ABNORMAL LOW (ref 1.15–1.40)
Chloride: 107 mmol/L (ref 98–111)
Creatinine, Ser: 0.7 mg/dL (ref 0.44–1.00)
Glucose, Bld: 78 mg/dL (ref 70–99)
HCT: 39 % (ref 36.0–46.0)
Hemoglobin: 13.3 g/dL (ref 12.0–15.0)
Potassium: 3.9 mmol/L (ref 3.5–5.1)
Sodium: 138 mmol/L (ref 135–145)
TCO2: 23 mmol/L (ref 22–32)

## 2022-05-14 LAB — DIFFERENTIAL
Abs Immature Granulocytes: 0.01 10*3/uL (ref 0.00–0.07)
Basophils Absolute: 0.1 10*3/uL (ref 0.0–0.1)
Basophils Relative: 2 %
Eosinophils Absolute: 0.1 10*3/uL (ref 0.0–0.5)
Eosinophils Relative: 3 %
Immature Granulocytes: 0 %
Lymphocytes Relative: 13 %
Lymphs Abs: 0.6 10*3/uL — ABNORMAL LOW (ref 0.7–4.0)
Monocytes Absolute: 0.5 10*3/uL (ref 0.1–1.0)
Monocytes Relative: 11 %
Neutro Abs: 3.2 10*3/uL (ref 1.7–7.7)
Neutrophils Relative %: 71 %

## 2022-05-14 LAB — PROTIME-INR
INR: 1 (ref 0.8–1.2)
Prothrombin Time: 13.5 seconds (ref 11.4–15.2)

## 2022-05-14 LAB — ETHANOL: Alcohol, Ethyl (B): <10 mg/dL (ref <10)

## 2022-05-14 LAB — APTT: aPTT: 28 seconds (ref 24–36)

## 2022-05-14 MED ORDER — SODIUM CHLORIDE 0.9% FLUSH: 3.0000 mL | Freq: Once | INTRAVENOUS | Status: DC

## 2022-05-14 NOTE — Telephone Encounter (Signed)
Pt called stating she is experiencing left sided facial numbness and it both thumbs. Based on pt's symptoms, nurse recommended pt report to ER for further evaluations.

## 2022-05-14 NOTE — ED Provider Triage Note (Signed)
Emergency Medicine Provider Triage Evaluation Note  Dawn [name] , a 76 y.o. female  was evaluated in triage.  Pt complains of sudden onset of numbness to the left lower lip and both thumbs when she was at the grocery store around 2/2:30 PM today.  Thumb numbness resolved, and numbness now includes the entire outer mouth.  Denies fever, chest pain, shortness of breath, urinary symptoms.  States she has decreased sensation of the left lower leg from the ankle down and up the right lower leg of the foot.  No other complaints at this time.  Hx of MS, essential HTN, Sjogren's, CAD, GERD, prior CVA  Review of Systems  Positive:  Negative: See above  Physical Exam  BP (!) 160/82   Pulse 67   Temp (!) 97.4 F (36.3 C) (Oral)   Resp 16   Ht '5\' 2"'$  (1.575 m)   Wt 76.2 kg   SpO2 96%   BMI 30.73 kg/m  Gen:   Awake, no distress   Resp:  Normal effort  MSK:   Moves extremities without difficulty  Other:  Negative BEFAST.  No facial asymmetry, pronator drift, facial droop, truncal deviation, discoordination.  Normal EOMs.  PERRLA.  CNIII-CNXII appear grossly intact.  Normal consensual/direct reflex.  Strength, coordination, and strength abppear grossly intact.  Medical Decision Making  Medically screening exam initiated at 5:42 PM.  Appropriate orders placed.  Dawn [name] was informed that the remainder of the evaluation will be completed by another provider, this initial triage assessment does not replace that evaluation, and the importance of remaining in the ED until their evaluation is complete.  Patient with bilateral subjective numbness of upper and lower lips, and transient numbness of bilateral thumbs.  Low suspicion for CVA, stroke protocol not initiated.   Prince Rome, PA-C 67/59/16 [id number]

## 2022-05-14 NOTE — Telephone Encounter (Signed)
Patient called stating she didn't go to the ER because she felt better after sitting there for awhile.  She wants to know if she should be seen tomorrow in the office.

## 2022-05-14 NOTE — Telephone Encounter (Signed)
Patient stated after she called triage earlier, she got in her car and, before driving off, she became "confused." She doesn't know how long that lasted. After the confusion passed, she stated she felt better and decided not to go to the ED. Recommended that she call EMS to take her to the ED to make sure there is nothing evolving in her brain. She stated she did not want to go and wait for hours to be seen. Explained that when there is a possible stroke or mini stroke, she would not be kept waiting. BP 8:20 AM 117/58, at 3:27 PM 125/58, P 70. Patient stated everyone just wants to send her to the ED. I informed her I would speak with C. Sarajane Jews and get back to her. Spoke with Virgie Dad, PA-C who stated patient should be seen in the ED. If patient chooses not to go, then to contact her neurologist and to keep her appointment with [facility] tomorrow. Called patient back and informed her of recommendation from C. Sarajane Jews. She stated OK and hung up.

## 2022-05-14 NOTE — ED Notes (Signed)
Pt evaluated by Elmo Putt, [place] while in triage. No code stroke called at this time.

## 2022-05-14 NOTE — Telephone Encounter (Signed)
Pt c/o of Chest Pain: STAT if CP now or developed within 24 hours  1. Are you having CP right now? Pt denies CP, but has numbness on face and numbness in hands   2. Are you experiencing any other symptoms (ex. SOB, nausea, vomiting, sweating)? No  3. How long have you been experiencing CP? A few minutes   4. Is your CP continuous or coming and going? Continuous   5. Have you taken Nitroglycerin? No.  ?

## 2022-05-14 NOTE — ED Triage Notes (Signed)
Pt reports numbness to bottom left side of her lips, tingling to both of her thumbs as well as trouble finding the right words, onset today at 2pm.   Hx of TIA 2 years ago.

## 2022-05-15 ENCOUNTER — Encounter: Payer: Self-pay | Admitting: Family

## 2022-05-15 ENCOUNTER — Telehealth: Payer: Self-pay | Admitting: Neurology

## 2022-05-15 ENCOUNTER — Ambulatory Visit (INDEPENDENT_AMBULATORY_CARE_PROVIDER_SITE_OTHER): Payer: Medicare Other | Admitting: Family

## 2022-05-15 VITALS — BP 128/86 | HR 68 | Temp 95.7°F | Resp 20 | Ht 62.0 in | Wt 168.6 lb

## 2022-05-15 DIAGNOSIS — Z8673 Personal history of transient ischemic attack (TIA), and cerebral infarction without residual deficits: Secondary | ICD-10-CM

## 2022-05-15 DIAGNOSIS — I10 Essential (primary) hypertension: Secondary | ICD-10-CM | POA: Diagnosis not present

## 2022-05-15 DIAGNOSIS — G35 Multiple sclerosis: Secondary | ICD-10-CM | POA: Diagnosis not present

## 2022-05-15 DIAGNOSIS — I2510 Atherosclerotic heart disease of native coronary artery without angina pectoris: Secondary | ICD-10-CM

## 2022-05-15 DIAGNOSIS — R200 Anesthesia of skin: Secondary | ICD-10-CM | POA: Diagnosis not present

## 2022-05-15 DIAGNOSIS — G35D Multiple sclerosis, unspecified: Secondary | ICD-10-CM

## 2022-05-15 NOTE — ED Notes (Signed)
CALLED PATIENT X4 NO ANSWER

## 2022-05-15 NOTE — Progress Notes (Signed)
Provider: Marlowe Sax FNP-C  Kaytelynn Scripter, [name], NP  Patient Care Team: Jalen Daluz, [name], NP as PCP - General (Family Medicine) Janina Mayo, MD as PCP - Cardiology (Cardiology) [name], MD as Consulting Physician (Orthopedic Surgery) [name], [name], MD (Neurology) [name], [name], [place] (Optometry) [name], NP as Nurse Practitioner (Obstetrics and Gynecology) [name], MD (Inactive) as Consulting Physician (Dermatology)  Extended Emergency Contact Information Primary Emergency Contact: [facility] Phone: [phone] Mobile Phone: [phone] Relation: Friend Secondary Emergency Contact: [place] Phone: [phone] Relation: Friend  Code Status:  DNR Goals of care: Advanced Directive information    05/14/2022    5:29 PM  Advanced Directives  Does Patient Have a Medical Advance Directive? No  Would patient like information on creating a medical advance directive? No - Patient declined     Chief Complaint  Patient presents with   Acute Visit    Patient presents today for numbness of lip and hands. She reports going to the ER on last night and had a CT scan done.    HPI:  Pt is a 76 y.o. female seen today for an acute visit for ED follow up for numbness of lips and arms on 05/14/2022.  States left before she was seen by the provider due to prolonged waiting time in the ED.  States symptoms have resolved. Thinks of words but " does not come out"  States numbness on the lips and hands have resolved.numbness was worst on lower lip and both hands especially thumb. None on the legs. No changes in the vision.  Left the ED prior to being seen since she didn't want to wait for another 12 yrs.  States symptoms started last week went to ED but left before bing seen. Had migraine last week for couple of days.usually just sit quietly and it goes away after 20 minutes.   CT scan done 05/14/2022 Chronic atrophic and ischemic changes  without acute abnormality. Has an order for MRA ordered by Neurologist Dr.[name] Delfino Lovett    Past Medical History:  Diagnosis Date   Allergy    CAD (coronary artery disease)    s/p DES to RCA in 06/2021   Dry eyes    Endometrial polyp    Essential hypertension 08/25/2018   GERD (gastroesophageal reflux disease)    History of hiatal hernia    History of kidney stones    Hot flashes, menopausal 10/13/2011   Estradiol started    Hyperlipidemia    Jaundice as teenager   no problems since   Memory loss 09/21/2019     1/2 of feet numb all the time   Multiple sclerosis ([place]) dx 2001   Neuropathy    bilateral feet   Osteoarthritis    Sjogren's syndrome ([place]) dx oct 2021   sore muscvles, dry mouth and eyes   Stroke [facility])    Vertigo    Vision abnormalities    Past Surgical History:  Procedure Laterality Date   ABDOMINAL HYSTERECTOMY  2002   partial   COLONOSCOPY  01/27/2022   2 day prep   colonscopy  2011   CORONARY STENT INTERVENTION N/A 07/18/2021   Procedure: CORONARY STENT INTERVENTION;  Surgeon: Jettie Booze, MD;  Location: [place] [facility];  Service: Cardiovascular;  Laterality: N/A;   DILATATION & CURETTAGE/HYSTEROSCOPY WITH MYOSURE N/A 06/08/2020   Procedure: DILATATION & CURETTAGE/HYSTEROSCOPY/Polypectomy WITH MYOSURE;  Surgeon: Armandina Stammer, DO;  Location: [place];  Service: Gynecology;  Laterality: N/A;  LEFT HEART CATH AND CORONARY ANGIOGRAPHY N/A 07/18/2021   Procedure: LEFT HEART CATH AND CORONARY ANGIOGRAPHY;  Surgeon: Jettie Booze, MD;  Location: [place] [facility];  Service: Cardiovascular;  Laterality: N/A;   LUMBAR FUSION  2001   TOTAL HIP ARTHROPLASTY Right 01/17/2021   Procedure: TOTAL HIP ARTHROPLASTY ANTERIOR APPROACH;  Surgeon: Rod Can, MD;  Location: [facility];  Service: Orthopedics;  Laterality: Right;   UPPER GI ENDOSCOPY  yrs ago    Allergies  Allergen Reactions   Statins Other (See Comments)     Muscle pain; Tolerates Crestor     Outpatient Encounter Medications as of 05/15/2022  Medication Sig   acetaminophen (TYLENOL) 500 MG tablet Take 1,000 mg by mouth every 6 (six) hours as needed for mild pain or moderate pain.   aspirin EC 81 MG tablet Take 81 mg by mouth daily. Swallow whole.   carvedilol (COREG) 6.25 MG tablet Take 1 tablet (6.25 mg total) by mouth 2 (two) times daily with a meal.   cholecalciferol (VITAMIN D) 25 MCG (1000 UNIT) tablet Take 2,000 Units by mouth daily.   clonazePAM (KLONOPIN) 0.5 MG tablet TAKE 1 TABLET(0.5 MG) BY MOUTH AT BEDTIME (Patient taking differently: Take 0.5 mg by mouth at bedtime.)   fexofenadine (ALLEGRA) 180 MG tablet Take 180 mg by mouth at bedtime.    Flaxseed, Linseed, (FLAXSEED OIL PO) Take 5-10 mLs by mouth 3 (three) times a week. Power   fluticasone (FLONASE) 50 MCG/ACT nasal spray Place 1 spray into both nostrils daily as needed for allergies or rhinitis.   hydrALAZINE (APRESOLINE) 25 MG tablet Take 1 tablet (25 mg total) by mouth 3 (three) times daily.   leflunomide (ARAVA) 20 MG tablet TAKE 1 TABLET(20 MG) BY MOUTH DAILY (Patient taking differently: Take 20 mg by mouth daily.)   Lidocaine 4 % PTCH Place 1 patch onto the skin daily as needed (mild pain). Remove & Discard patch within 12 hours or as directed by MD   losartan (COZAAR) 100 MG tablet TAKE 1 TABLET(100 MG) BY MOUTH DAILY (Patient taking differently: Take 100 mg by mouth daily.)   melatonin 5 MG TABS Take 5 mg by mouth at bedtime as needed (Sleep).   modafinil (PROVIGIL) 200 MG tablet TAKE 1 TABLET(200 MG) BY MOUTH IN THE MORNING (Patient taking differently: Take 200 mg by mouth in the morning.)   nitroGLYCERIN (NITROSTAT) 0.4 MG SL tablet Place 1 tablet (0.4 mg total) under the tongue every 5 (five) minutes as needed for chest pain.   pantoprazole (PROTONIX) 40 MG tablet Take 1 tablet (40 mg total) by mouth daily.   rosuvastatin (CRESTOR) 10 MG tablet TAKE 1 TABLET BY MOUTH DAILY  (Patient taking differently: Take 10 mg by mouth daily.)   solifenacin (VESICARE) 5 MG tablet Take 5 mg by mouth daily.   sulfamethoxazole-trimethoprim (BACTRIM DS) 800-160 MG tablet Take 1 tablet by mouth 2 (two) times daily.   No facility-administered encounter medications on file as of 05/15/2022.    Review of Systems  Constitutional:  Negative for appetite change, chills, fatigue, fever and unexpected weight change.  HENT:  Negative for congestion, dental problem, ear discharge, ear pain, facial swelling, hearing loss, nosebleeds, postnasal drip, rhinorrhea, sinus pressure, sinus pain, sneezing, sore throat, tinnitus and trouble swallowing.   Eyes:  Negative for pain, discharge, redness, itching and visual disturbance.  Respiratory:  Negative for cough, chest tightness, shortness of breath and wheezing.   Cardiovascular:  Negative for chest pain, palpitations and leg  swelling.  Gastrointestinal:  Negative for abdominal distention, abdominal pain, blood in stool, constipation, diarrhea, nausea and vomiting.  Endocrine: Negative for cold intolerance, heat intolerance, polydipsia, polyphagia and polyuria.  Genitourinary:  Negative for difficulty urinating, dysuria, flank pain, frequency and urgency.  Musculoskeletal:  Negative for back pain, gait problem, joint swelling, neck pain and neck stiffness.       Fibromylagia  Skin:  Negative for color change, pallor and rash.  Neurological:  Negative for dizziness, syncope, speech difficulty, weakness, light-headedness, numbness and headaches.  Hematological:  Does not bruise/bleed easily.  Psychiatric/Behavioral:  Negative for agitation, behavioral problems, confusion, hallucinations, self-injury, sleep disturbance and suicidal ideas. The patient is not nervous/anxious.     Immunization History  Administered Date(s) Administered   Fluad Quad(high Dose 65+) 04/01/2019, 04/19/2021   Influenza Whole 05/14/2011   Influenza, High Dose Seasonal PF  04/30/2017, 05/15/2018, 05/18/2020   Influenza-Unspecified 03/04/2022   PFIZER(Purple Top)SARS-COV-2 Vaccination 08/27/2019, 09/21/2019, 03/07/2020, 11/16/2020   Pfizer Covid-19 Vaccine Bivalent Booster 67yr & up 03/04/2022   Pneumococcal Conjugate-13 08/30/2018   Pneumococcal Polysaccharide-23 03/21/2009, 04/20/2020   Td (Adult), 2 Lf Tetanus Toxid, Preservative Free 12/04/2009   Zoster, Live 11/29/2009   Pertinent  Health Maintenance Due  Topic Date Due   DEXA SCAN  07/21/2022 (Originally 12/20/2012)   MAMMOGRAM  04/16/2023   INFLUENZA VACCINE  Completed      02/10/2022    3:05 PM 04/22/2022    9:58 AM 05/08/2022   10:18 PM 05/09/2022   10:17 AM 05/14/2022    5:29 PM  Fall Risk  Falls in the past year? 0 0     Was there an injury with Fall? 0 0     Fall Risk Category Calculator 0 0     Fall Risk Category Low Low     Patient Fall Risk Level Low fall risk Low fall risk Low fall risk Low fall risk Moderate fall risk  Patient at Risk for Falls Due to No Fall Risks History of fall(s)     Fall risk Follow up Falls evaluation completed Falls evaluation completed      Functional Status Survey:    Vitals:   05/15/22 1339  Pulse: 68  Resp: 20  Temp: (!) 95.7 F (35.4 C)  SpO2: 97%  Weight: 168 lb 9.6 oz (76.5 kg)  Height: '5\' 2"'$  (1.575 m)   Body mass index is 30.84 kg/m. Physical Exam Vitals reviewed.  Constitutional:      General: She is not in acute distress.    Appearance: Normal appearance. She is obese. She is not ill-appearing or diaphoretic.  HENT:     Head: Normocephalic.     Right Ear: Tympanic membrane, ear canal and external ear normal. There is no impacted cerumen.     Left Ear: Tympanic membrane, ear canal and external ear normal. There is no impacted cerumen.     Nose: Nose normal. No congestion or rhinorrhea.     Mouth/Throat:     Mouth: Mucous membranes are moist.     Pharynx: Oropharynx is clear. No oropharyngeal exudate or posterior oropharyngeal  erythema.  Eyes:     General: No scleral icterus.       Right eye: No discharge.        Left eye: No discharge.     Extraocular Movements: Extraocular movements intact.     Conjunctiva/sclera: Conjunctivae normal.     Pupils: Pupils are equal, round, and reactive to light.  Neck:     Vascular:  No carotid bruit.  Cardiovascular:     Rate and Rhythm: Normal rate and regular rhythm.     Pulses: Normal pulses.     Heart sounds: Normal heart sounds. No murmur heard.    No friction rub. No gallop.  Pulmonary:     Effort: Pulmonary effort is normal. No respiratory distress.     Breath sounds: Normal breath sounds. No wheezing, rhonchi or rales.  Chest:     Chest wall: No tenderness.  Abdominal:     General: Bowel sounds are normal. There is no distension.     Palpations: Abdomen is soft. There is no mass.     Tenderness: There is no abdominal tenderness. There is no right CVA tenderness, left CVA tenderness, guarding or rebound.  Musculoskeletal:        General: No swelling or tenderness. Normal range of motion.     Cervical back: Normal range of motion. No rigidity or tenderness.     Right lower leg: No edema.     Left lower leg: No edema.  Lymphadenopathy:     Cervical: No cervical adenopathy.  Skin:    General: Skin is warm and dry.     Coloration: Skin is not pale.     Findings: No bruising, erythema, lesion or rash.  Neurological:     Mental Status: She is alert and oriented to person, place, and time.     Cranial Nerves: No cranial nerve deficit.     Sensory: No sensory deficit.     Motor: No weakness.     Coordination: Coordination normal.     Gait: Gait abnormal.  Psychiatric:        Mood and Affect: Mood normal.        Speech: Speech normal.        Behavior: Behavior normal.        Thought Content: Thought content normal.        Judgment: Judgment normal.     Labs reviewed: Recent Labs    06/15/21 0638 06/16/21 0222 07/08/21 0942 04/18/22 0845 05/09/22 0014  05/14/22 1720 05/14/22 1755  NA  --  138   < > 139 137 139 138  K  --  4.5   < > 4.2 3.8 3.9 3.9  CL  --  109   < > 105 107 109 107  CO2  --  24   < > 27 23 21*  --   GLUCOSE  --  91   < > 83 96 77 78  BUN  --  16   < > 16 26* 19 20  CREATININE  --  0.72   < > 0.69 0.68 0.91 0.70  CALCIUM  --  8.7*   < > 9.4 9.2 9.4  --   MG 1.6* 2.3  --   --   --   --   --   PHOS 3.9 3.9  --   --   --   --   --    < > = values in this interval not displayed.   Recent Labs    06/16/21 0222 10/15/21 0831 04/18/22 0845 05/09/22 0014 05/14/22 1720  AST 17   < > '18 23 21  '$ ALT 16   < > '14 19 14  '$ ALKPHOS 101  --   --  104 93  BILITOT 0.4   < > 0.4 0.5 0.5  PROT 5.6*   < > 6.2 6.7 6.3*  ALBUMIN 3.4*  --   --  4.1 4.0   < > = values in this interval not displayed.   Recent Labs    04/18/22 0845 05/09/22 0014 05/14/22 1720 05/14/22 1755  WBC 3.7* 6.1 4.5  --   NEUTROABS 2,549 4.1 3.2  --   HGB 12.9 13.1 12.8 13.3  HCT 38.8 40.5 39.7 39.0  MCV 91.1 93.5 93.0  --   PLT 192 198 183  --    Lab Results  Component Value Date   TSH 5.41 (H) 04/18/2022   Lab Results  Component Value Date   HGBA1C 5.4 06/15/2021   Lab Results  Component Value Date   CHOL 146 04/18/2022   HDL 44 (L) 04/18/2022   LDLCALC 78 04/18/2022   LDLDIRECT 130.2 11/03/2011   TRIG 144 04/18/2022   CHOLHDL 3.3 04/18/2022    Significant Diagnostic Results in last 30 days:  CT HEAD WO CONTRAST  Result Date: 05/14/2022 CLINICAL DATA:  Left-sided facial numbness, initial encounter EXAM: CT HEAD WITHOUT CONTRAST TECHNIQUE: Contiguous axial images were obtained from the base of the skull through the vertex without intravenous contrast. RADIATION DOSE REDUCTION: This exam was performed according to the departmental dose-optimization program which includes automated exposure control, adjustment of the mA and/or kV according to patient size and/or use of iterative reconstruction technique. COMPARISON:  05/08/2022 FINDINGS:  Brain: No evidence of acute infarction, hemorrhage, hydrocephalus, extra-axial collection or mass lesion/mass effect. Mild atrophic changes and chronic white matter ischemic changes are seen. Vascular: No hyperdense vessel or unexpected calcification. Skull: Normal. Negative for fracture or focal lesion. Sinuses/Orbits: No acute finding. Other: None. IMPRESSION: Chronic atrophic and ischemic changes without acute abnormality. Electronically Signed   By: Inez Catalina M.D.   On: 05/14/2022 19:55   CT Head Wo Contrast  Result Date: 05/08/2022 CLINICAL DATA:  Headache, new or worsening (Age >= 50y) EXAM: CT HEAD WITHOUT CONTRAST TECHNIQUE: Contiguous axial images were obtained from the base of the skull through the vertex without intravenous contrast. RADIATION DOSE REDUCTION: This exam was performed according to the departmental dose-optimization program which includes automated exposure control, adjustment of the mA and/or kV according to patient size and/or use of iterative reconstruction technique. COMPARISON:  None Available. FINDINGS: Brain: No acute intracranial abnormality. Specifically, no hemorrhage, hydrocephalus, mass lesion, acute infarction, or significant intracranial injury. Vascular: No hyperdense vessel or unexpected calcification. Skull: No acute calvarial abnormality. Sinuses/Orbits: No acute findings Other: No IMPRESSION: No acute intracranial abnormality. Electronically Signed   By: Rolm Baptise M.D.   On: 05/08/2022 23:37    Assessment/Plan 1. Numbness Symptoms resolved Status post ED visit for numbness on the lips and both hands. CT scan done 05/14/2022 Chronic atrophic and ischemic changes without acute abnormality.  Has MRI ordered pending neurologist. -Continue to follow-up with a neurologist she will schedule appointment to follow-up ED visit.  2. Multiple sclerosis ([place]) Continue to follow-up with the neurologist  3. Essential hypertension Blood pressure well  controlled Continue current medication  4. History of CVA (cerebrovascular accident) No residual noted.  Recent CT scan without any acute abnormality Continue on rosuvastatin -Continue on aspirin -We will continue to control high risk factors  Family/ staff Communication: Reviewed plan of care with patient verbalized understanding  Labs/tests ordered: None   Next Appointment: Return if symptoms worsen or fail to improve.    Sandrea Hughs, NP

## 2022-05-15 NOTE — Patient Instructions (Signed)
Please schedule appointment with Neurologist for follow up ED visit for numbness on lip and hands.

## 2022-05-15 NOTE — Telephone Encounter (Signed)
Called the patient back. She went to the ER for new onset numbness in hands and mouth. She states the numbness has cleared up but her word findings problems are still present. I advised that MRI was ordered at the visit from 10/23 and that GI is working on [employer] and should be calling to get her scheduled. I provided her with the phone number to GI to get scheduled. She was frustrated because she feels that this is a neuro or cardio issue and she is being tossed around. I advised she did all the right things as far as numbness occurred she went to ER, they completed CT which was clear. Advised that we have the MRI ordered and that process going and so we would really need to have that image.  I advised her to call GI and I would check with our MRI person as well.  She hung up on me. I spoke with Tori who advised that GI left a VM on her phone and she needs to call back and get scheduled. The patient states she dosn't have a VM from anyone. I advised I was just telling her what I was told. Instructed her to contact GI it sounds like they are ready to scheduled her.

## 2022-05-15 NOTE — Telephone Encounter (Signed)
Pt said went to ED with numbness in mouth, hands. Would like a call from the nurse to discuss a sooner appt.

## 2022-05-16 ENCOUNTER — Ambulatory Visit
Admission: RE | Admit: 2022-05-16 | Discharge: 2022-05-16 | Disposition: A | Discharge status: Home / Self Care | Payer: Medicare Other | Source: Ambulatory Visit | Attending: Neurology | Admitting: Neurology

## 2022-05-16 DIAGNOSIS — G35 Multiple sclerosis: Secondary | ICD-10-CM

## 2022-05-17 ENCOUNTER — Telehealth: Payer: Self-pay | Admitting: Neurology

## 2022-05-17 DIAGNOSIS — I639 Cerebral infarction, unspecified: Secondary | ICD-10-CM

## 2022-05-17 NOTE — Telephone Encounter (Signed)
The MRI of the brain showed a subacute stroke in the left posterior frontal/parietal lobe likely related to the symptoms she had earlier this month.  It also shows some scattered T2/FLAIR hyperintense foci likely a combination of demyelination and chronic microvascular ischemic change.  I called and got voicemail and left a message.  Because of the stroke I will want to check carotid Dopplers.  She had an echocardiogram November 2022.

## 2022-05-19 NOTE — Telephone Encounter (Signed)
I spoke to Dawn [name].  The MRI of the brain she had a couple days ago showed a subacute stroke in the left posterior frontal lobe that likely explains her symptoms she had had a few days earlier.  Elsewhere in the hemispheres, she has some white matter changes consistent with her history of MS and likely some chronic microvascular ischemic change mixed in  We will check a carotid ultrasound.  She had echocardiogram late last year that did not showed grade 1 diastolic dysfunction and no significant valvular pathology or dysfunction.  She will continue aspirin.  Based on the findings, if significant stenosis, we would have to consider referral for intervention.    She also asked me to forward this report to her cardiologist, Dr. Harl Bowie.

## 2022-05-20 DIAGNOSIS — M47816 Spondylosis without myelopathy or radiculopathy, lumbar region: Secondary | ICD-10-CM | POA: Diagnosis not present

## 2022-05-21 ENCOUNTER — Encounter: Payer: Self-pay | Admitting: Neurology

## 2022-05-21 ENCOUNTER — Ambulatory Visit
Admission: RE | Admit: 2022-05-21 | Discharge: 2022-05-21 | Disposition: A | Discharge status: Home / Self Care | Payer: Medicare Other | Source: Ambulatory Visit | Attending: Neurology | Admitting: Neurology

## 2022-05-21 DIAGNOSIS — Z8673 Personal history of transient ischemic attack (TIA), and cerebral infarction without residual deficits: Secondary | ICD-10-CM | POA: Diagnosis not present

## 2022-05-21 DIAGNOSIS — I639 Cerebral infarction, unspecified: Secondary | ICD-10-CM

## 2022-05-21 NOTE — Telephone Encounter (Signed)
If having speech changes I recommend evaluation in the Emergency room as soon as possible.Dr.[name] will review MRI results then up date you.

## 2022-05-22 ENCOUNTER — Encounter (HOSPITAL_BASED_OUTPATIENT_CLINIC_OR_DEPARTMENT_OTHER): Payer: Self-pay | Admitting: Physical Therapy

## 2022-05-22 ENCOUNTER — Telehealth: Payer: Self-pay | Admitting: Internal Medicine

## 2022-05-22 NOTE — Telephone Encounter (Signed)
Patient stated she wanted Dr. Harl Bowie to review her 05/16/2022 brain MRI and give her opinion cardiac-wise. She stated she is still having speech issues. Speech was clear on the phone. Spoke with Dr. Harl Bowie who deferred patient back to neurology. Patient advised and became upset because she said Dr. Felecia Shelling treats her MS and is not a neurologist. I recommended that she speak with D. [name] to inquire who she could talk to about her concerns. She asked,"So my stroke is not cardiac-related?" I checked her last appointment in [place] (05/13/2022 with Lyla Glassing). Patient then stated, "I'm not getting the care I need." She stated her BP fluctuates. I explained the reasons BP will fluctuate, and as long the SBP is not sustained  over 130 or below 105, not to worry. I informed her that I will write this note to Dr. Harl Bowie and to C. Sarajane Jews and wait on a reply. BP a 1 pm today 145/73, P 63. Patient is taking medications as prescribed. Her NTG will expire this month (order while in hospital). She denies angina, but would like a refill. Can NTG refill be placed? Patient said she would like a call from [facility].

## 2022-05-22 NOTE — Telephone Encounter (Signed)
New Message:      Patient says she would like for Dr Harl Bowie or her nurse to please call her when she have time. She wants to discuss the results of her MRI which showed that she have had a mini stroke.s"

## 2022-05-23 MED ORDER — NITROGLYCERIN 0.4 MG SL SUBL: 0.4000 mg | SUBLINGUAL_TABLET | SUBLINGUAL | 0 refills | Status: AC | PRN

## 2022-05-23 NOTE — Telephone Encounter (Signed)
Patient sent MyChart message worried on next steps post learning of her Mini-stroke. She is very worried on what to expect next or what we are going to do for her heart to make sure this does not happen again.  I informed patient that we will order a 30-day heart monitor to rule out any arrhthymias and she is unsure this is all we need to do. Patient is very apprehensive on suggestions. She has lots of question and to best be answered during an office visit.   Patient schedule for an office visit on 11/8 @ 8:40 AM with Dr. Harl Bowie.  Refill for nitro sent to pharmacy.  She also inquired about changing her Statin from Crestor to Pravastatin due to body aches.  Patient appreciative of phone call and no additional questions at this time.

## 2022-05-23 NOTE — Telephone Encounter (Signed)
If you have any new symptoms make sure you go to the Emergency room.Please follow up with Neurologist to discuss MRI results.

## 2022-05-23 NOTE — Telephone Encounter (Signed)
Looks like MRI showed a subacute stroke of the posterior frontal/parietal lobe. I agree that patient should follow-up with Neurology on this especially if she is still having any neurologic symptoms (such as speech changes). I know Dr. Felecia Shelling specifically treats her MS but he is a Neurologist so I agree that she should follow-up with him. I would recommend we order a 30 day Event Monitor to assess for any atrial fibrillation/flutter which could cause a stroke. Otherwise, I would defer to Neurology.   Yes, we can refill her Nitroglycerin.  Thank you!

## 2022-05-26 NOTE — Telephone Encounter (Signed)
Called and spoke with pt. Offered appt 06/02/22 at 3pm with Dr. Felecia Shelling. She declined stating she had another appt at 2pm. Aware we will have to monitor for any cx next week and will reach out if something else opens up. She verbalized understanding.

## 2022-05-27 NOTE — Telephone Encounter (Signed)
Called pt. Scheduled work in visit for 05/29/22 at 3:30pm with Dr. Felecia Shelling. Advised her to check in at 3:00pm. She verbalized understanding.

## 2022-05-27 NOTE — Telephone Encounter (Signed)
Tried calling ptx2. Got busy signal each time.  If she calls, was going to offer appt with Dr. Felecia Shelling next week on 06/05/22 at [place]. Please offer to pt if she call back.

## 2022-05-27 NOTE — Progress Notes (Unsigned)
Cardiology Office Note:    Date:  05/27/2022   ID:  Dawn [name], DOB 05/29/1946, MRN [id number]  PCP:  Sandrea Hughs, NP   [facility] Providers Cardiologist:  Janina Mayo, MD     Referring MD: Sandrea Hughs, NP   No chief complaint on file. Hospital Follow Up  History of Present Illness:   Initial HPI Dawn [name] is a 76 y.o. female with a hx  below, CVA on aspirin, HLD,  MS, hx referral for hospitalization with NSTEMI, hypertensive emergency  She got tight around her chest. She was short of breath. She had arm cramping. Her symptoms were abrupt. It was later in the day and she was doing activity. She has MS and felt an "MS hug" which can be a tight feeling in her chest. Prior to that she she had no orthopnea, PND, or LE edema.  She reported her blood pressure was high and it was SBP 210 when she checked it. She went to urgent care. She went to the ED. In the ED she reported above. She had EKG with no ischemic changes. Her troponin was below. She had systolic Bps [id number].  She and an echo with normal LV function, mild LVH, no valve dx. RA pressure of 3. Don't see a BNP. Her chest xray was unremarkable. She was hypertensive and her coreg was increased and hydralazine was added. Admission sounds like hypertensive emergency. She was thought to have diastolic CHF with "new onset grade I diastolic dysfunction".  06/15/2021 Trop 182-->322->281->365->387->322  EKG 06/18/2021-NSR   Today, she feels light headed and tired. She hasn't had pain. When she does activity she feels palpitations. No chest pressure with activity. No shortness of breath with activity. No hospital admission for heart failure in the past. She smoked for 11 years and quit in 1987. No stress test and no LHC.  Her father died of MI at 71. Her brother died of heart attack at 65. Sister and grandmother had rheumatoid arthritis. She was diagnosed with MS in 2001 and it is progressive.  She was on zetia but  stopped b/c of muscle aches. She was on a statin years ago. She had full body cramping.   06/15/2021 LDL 142 HDL 44 TC 213 A1c 5.4 Crt 0.7 TSH 4.4   Interim Hx 08/02/2021 Dawn [name] underwent CTA  that showed a significant functional lesion in the RCA. She underwent LHC that showed prox to mid 99% RCA lesion with L-R collaterals. She underwent successful PCI. She was started on aspirin and brilintia. She feels tired. She did feel some shortness of breath after starting the brillinta. Her chest tightness has resolved. Her right radial site is healed. She feels some light headedness but HR and BP are fine. She is on the [place] diet.  Interim Hx 09/27/2021 Nothing has changed. She said there was an appointment made for today, plan was 6 month FU. She is out of brillinta with only three day supply. Still has som SOB with it. Her symptoms are not like they were prior to her PCI.  Brillinta cost is high and she would like an alternative  Interim 04/01/2022 Continues plavix no bleeding. No DOE and chest pressure. Her physical therapy is going well.  Interim 05/27/2022 Had a recent stroke. Carotid US was did not show obstructive disease. MRI showed ubacute stroke of the posterior frontal/parietal lobe. Planned for 30 day monitor. Followed by neurology.    Cardiology Studies TTE 06/15/2021 1. Left ventricular  ejection fraction, by estimation, is 60 to 65%. The  left ventricle has normal function. The left ventricle has no regional  wall motion abnormalities. There is mild left ventricular hypertrophy.  Left ventricular diastolic parameters  are consistent with Grade I diastolic dysfunction (impaired relaxation).   2. Right ventricular systolic function is normal. The right ventricular  size is normal. Tricuspid regurgitation signal is inadequate for assessing  PA pressure.   3. The mitral valve is grossly normal. Mild mitral valve regurgitation.  No evidence of mitral stenosis.   4.  The aortic valve is grossly normal. There is mild calcification of the  aortic valve. Aortic valve regurgitation is not visualized. No aortic  stenosis is present.   5. The inferior vena cava is normal in size with greater than 50%  respiratory variability, suggesting right atrial pressure of 3 mmHg.   LHC 06/28/2021    Mid Cx lesion is 25% stenosed.   Prox LAD to Mid LAD lesion is 25% stenosed.   Prox RCA to Mid RCA lesion is 99% stenosed.  Left-to-right collaterals.   A drug-eluting stent was successfully placed using a STENT ONYX FRONTIER 3.0X30, postdilated to 3.5 mm.   Post intervention, there is a 0% residual stenosis.   The left ventricular systolic function is normal.   LV end diastolic pressure is normal.   The left ventricular ejection fraction is 55-65% by visual estimate.   There is no aortic valve stenosis.  Past Medical History:  Diagnosis Date   Allergy    CAD (coronary artery disease)    s/p DES to RCA in 06/2021   Dry eyes    Endometrial polyp    Essential hypertension 08/25/2018   GERD (gastroesophageal reflux disease)    History of hiatal hernia    History of kidney stones    Hot flashes, menopausal 10/13/2011   Estradiol started    Hyperlipidemia    Jaundice as teenager   no problems since   Memory loss 09/21/2019     1/2 of feet numb all the time   Multiple sclerosis ([place]) dx 2001   Neuropathy    bilateral feet   Osteoarthritis    Sjogren's syndrome ([place]) dx oct 2021   sore muscvles, dry mouth and eyes   Stroke [facility])    Vertigo    Vision abnormalities     Past Surgical History:  Procedure Laterality Date   ABDOMINAL HYSTERECTOMY  2002   partial   COLONOSCOPY  01/27/2022   2 day prep   colonscopy  2011   CORONARY STENT INTERVENTION N/A 07/18/2021   Procedure: CORONARY STENT INTERVENTION;  Surgeon: Jettie Booze, MD;  Location: [facility];  Service: Cardiovascular;  Laterality: N/A;   DILATATION & CURETTAGE/HYSTEROSCOPY WITH  MYOSURE N/A 06/08/2020   Procedure: DILATATION & CURETTAGE/HYSTEROSCOPY/Polypectomy WITH MYOSURE;  Surgeon: Armandina Stammer, DO;  Location: [facility];  Service: Gynecology;  Laterality: N/A;   LEFT HEART CATH AND CORONARY ANGIOGRAPHY N/A 07/18/2021   Procedure: LEFT HEART CATH AND CORONARY ANGIOGRAPHY;  Surgeon: Jettie Booze, MD;  Location: [facility];  Service: Cardiovascular;  Laterality: N/A;   LUMBAR FUSION  2001   TOTAL HIP ARTHROPLASTY Right 01/17/2021   Procedure: TOTAL HIP ARTHROPLASTY ANTERIOR APPROACH;  Surgeon: Rod Can, MD;  Location: [facility];  Service: Orthopedics;  Laterality: Right;   UPPER GI ENDOSCOPY  yrs ago    Current Medications: No outpatient medications have been marked as taking for the 05/28/22  encounter (Appointment) with Janina Mayo, MD.     Allergies:   Statins   Social History   Socioeconomic History   Marital status: Widowed    Spouse name: Not on file   Number of children: Not on file   Years of education: Not on file   Highest education level: Not on file  Occupational History   Occupation: retired  Tobacco Use   Smoking status: Former    Packs/day: 0.50    Years: 15.00    Total pack years: 7.50    Types: Cigarettes    Quit date: 08/05/1983    Years since quitting: 38.8   Smokeless tobacco: Never  Vaping Use   Vaping Use: Never used  Substance and Sexual Activity   Alcohol use: Yes    Comment: rarely, maybe yearly drink   Drug use: Never   Sexual activity: Not Currently    Birth control/protection: Post-menopausal  Other Topics Concern   Not on file  Social History Narrative   Regular Exercise-no   Widowed   Retired   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     Tobacco use, amount per day now: former   Past tobacco use, amount per day: 1/2-1  packet   How many years did you use tobacco: 12 years   Alcohol use (drinks per week): 0   Diet: mediterranean based   Do you drink/eat things with caffeine: yes   Marital status:        widow                          What year were you married? 1987   Do you live in a house, apartment, assisted living, condo, trailer, etc.? house   Is it one or more stories? 1 story   How many persons live in your home? 1   Do you have pets in your home?( please list) no   Current or past profession: Science writer    Do you exercise?          yes                        Type and how often? Tai chi and pt therapy, stretches etc....   Do you have a living will? yes   Do you have a DNR form?      no                             If not, do you want to discuss one? yes   Do you have signed POA/HPOA forms?      no                  If so, please bring to you appointment   Social Determinants of Health   Financial Resource Strain: Not on file  Food Insecurity: No Food Insecurity (01/07/2022)   Hunger Vital Sign    Worried About Running Out of Food in the Last Year: Never true    Ran Out of Food in the Last Year: Never true  Transportation Needs: No Transportation Needs (01/07/2022)   PRAPARE - Hydrologist (Medical): No  Lack of Transportation (Non-Medical): No  Physical Activity: Not on file  Stress: Not on file  Social Connections: Not on file     Family History: The patient's family history includes Arthritis in an other family member; Heart failure in her father and mother; Hypertension in an other family member. There is no history of Colon cancer, Colon polyps, Esophageal cancer, Stomach cancer, or Rectal cancer.  ROS:   Please see the history of present illness.     All other systems reviewed and are negative.  EKGs/Labs/Other Studies Reviewed:    The following studies were reviewed today:   EKG:  EKG is  ordered today.  The ekg ordered today  demonstrates   04/01/2022- NSR  Prior ECGs NSR,  35m TWI laterally  NSR TWI inferiorly  Recent Labs: 06/16/2021: Magnesium 2.3 04/18/2022: TSH 5.41 05/14/2022: ALT 14; BUN 20; Creatinine, Ser 0.70; Hemoglobin 13.3; Platelets 183; Potassium 3.9; Sodium 138   Recent Lipid Panel    Component Value Date/Time   CHOL 146 04/18/2022 0845   CHOL 138 09/03/2021 0959   TRIG 144 04/18/2022 0845   HDL 44 (L) 04/18/2022 0845   HDL 39 (L) 09/03/2021 0959   CHOLHDL 3.3 04/18/2022 0845   VLDL 27 06/15/2021 0256   LDLCALC 78 04/18/2022 0845   LDLDIRECT 130.2 11/03/2011 0920     Risk Assessment/Calculations:           Physical Exam:    VS:  ***   Wt Readings from Last 3 Encounters:  05/15/22 168 lb 9.6 oz (76.5 kg)  05/14/22 168 lb (76.2 kg)  05/13/22 168 lb (76.2 kg)     GEN:  Well nourished, well developed in no acute distress HEENT: Normal NECK: No JVD LYMPHATICS: No lymphadenopathy CARDIAC: RRR, 3/6 SEM RUSB (flow) rubs, gallops RESPIRATORY:  Clear to auscultation without rales, wheezing or rhonchi  ABDOMEN: Soft, non-tender, non-distended MUSCULOSKELETAL:  No edema; No deformity  SKIN: Warm and dry NEUROLOGIC:  Alert and oriented x 3 PSYCHIATRIC:  Normal affect   ASSESSMENT:   #Stroke: 30 day event monitor. Managed by neurology  #RCA PCI:  Inially presented after hospital FU reporting chest pressure and had nstemi. This was in the setting of hypertensive emergency. Was seen in FU and CTA was obtained 2/2 symptoms and CVD risk including CVA and smoking. She had RCA  FFR+ lesion. She is s/p  prox-mid DES.She denies CP. SOB persists on Brillinta. The cost is prohibitive. Changed to plavix requiring PPI change - on asa; s/p plavix - she is acceptable risk for nerve abulation -cont oreg 6.25 mg BID -continue nitro SL -she has her own exercise program that she does for her MS and would like to continue with that instead of cardiac rehab; I said that was fine  #HLD: LDL  goal < 70 mg/dL - LDL at goal 69 mg/dL 09/03/2021 - continue crestor 10 mg daily  #HTN: well controlled.  Instructed to take blood pressures at home.Continue coreg 6.25 mg BID, hydralazine 10 mg TID, losartan 100 mg. Goal < 130/80 mmHg   PLAN:    In order of problems listed above:  *** Follow up 12 months          Medication Adjustments/Labs and Tests Ordered: Current medicines are reviewed at length with the patient today.  Concerns regarding medicines are outlined above.  No orders of the defined types were placed in this encounter.  No orders of the defined types were placed in this encounter.   There  are no Patient Instructions on file for this visit.   Signed, Janina Mayo, MD  05/27/2022 3:38 PM    [facility]

## 2022-05-28 ENCOUNTER — Encounter: Payer: Self-pay | Admitting: Internal Medicine

## 2022-05-28 ENCOUNTER — Encounter: Payer: Self-pay | Admitting: *Deleted

## 2022-05-28 ENCOUNTER — Other Ambulatory Visit: Payer: Self-pay | Admitting: Internal Medicine

## 2022-05-28 ENCOUNTER — Ambulatory Visit: Payer: Medicare Other | Attending: Internal Medicine | Admitting: Internal Medicine

## 2022-05-28 VITALS — BP 128/68 | HR 72 | Ht 62.0 in | Wt 165.2 lb

## 2022-05-28 DIAGNOSIS — I639 Cerebral infarction, unspecified: Secondary | ICD-10-CM

## 2022-05-28 DIAGNOSIS — I4891 Unspecified atrial fibrillation: Secondary | ICD-10-CM

## 2022-05-28 MED ORDER — ROSUVASTATIN CALCIUM 20 MG PO TABS: 20.0000 mg | ORAL_TABLET | Freq: Every day | ORAL | 3 refills | Status: DC

## 2022-05-28 NOTE — Addendum Note (Signed)
Addended by: Rexanne Mano B on: 05/28/2022 09:16 AM   Modules accepted: Orders

## 2022-05-28 NOTE — Progress Notes (Signed)
Patient enrolled for [employer] to ship a 30 day cardiac event monitor to her address on file.

## 2022-05-28 NOTE — Patient Instructions (Signed)
Medication Instructions:  INCREASE CRESTOR TO '20mg'$  ONCE DAILY  *If you need a refill on your cardiac medications before your next appointment, please call your pharmacy*  Lab Work: None Ordered At This Time.  If you have labs (blood work) drawn today and your tests are completely normal, you will receive your results only by: [place] (if you have MyChart) OR A paper copy in the mail If you have any lab test that is abnormal or we need to change your treatment, we will call you to review the results.  Testing/Procedures: None Ordered At This Time.   Follow-Up: At [facility], you and your health needs are our priority.  As part of our continuing mission to provide you with exceptional heart care, we have created designated Provider Care Teams.  These Care Teams include your primary Cardiologist (physician) and Advanced Practice Providers (APPs -  Physician Assistants and Nurse Practitioners) who all work together to provide you with the care you need, when you need it.  PLEASE SCHEDULE APPOINTMENT WITH PHARMD FOR CHOLESTEROL   Your next appointment:   6 month(s)  The format for your next appointment:   In Person  Provider:   Janina Mayo, MD     Other Instructions  [employer] Cardiac Event Monitor Instructions Your physician has requested you wear your cardiac event monitor for __30___ days.  [employer] may call or text to confirm a shipping address. The monitor will be sent to a land address via UPS. [employer] will not ship a monitor to a PO BOX. It typically takes 3-5 days to receive your monitor after it has been enrolled. [employer] will assist with USPS tracking if your package is delayed. The telephone number for [employer] is [phone]. Once you have received your monitor, please review the enclosed instructions. Instruction tutorials can also be viewed under help and settings on the enclosed cell phone. Your monitor has already been registered  assigning a specific monitor serial # to you.  Applying the monitor Remove cell phone from case and turn it on. The cell phone works as Dealer and needs to be within [employer] of you at all times. The cell phone will need to be charged on a daily basis. We recommend you plug the cell phone into the enclosed charger at your bedside table every night.  Monitor batteries: You will receive two monitor batteries labelled #1 and #2. These are your recorders. Plug battery #2 onto the second connection on the enclosed charger. Keep one battery on the charger at all times. This will keep the monitor battery deactivated. It will also keep it fully charged for when you need to switch your monitor batteries. A small light will be blinking on the battery emblem when it is charging. The light on the battery emblem will remain on when the battery is fully charged.  Open package of a Monitor strip. Insert battery #1 into black hood on strip and gently squeeze monitor battery onto connection as indicated in instruction booklet. Set aside while preparing skin.  Choose location for your strip, vertical or horizontal, as indicated in the instruction booklet. Shave to remove all hair from location. There cannot be any lotions, oils, powders, or colognes on skin where monitor is to be applied. Wipe skin clean with enclosed Saline wipe. Dry skin completely.  Peel paper labeled #1 off the back of the Monitor strip exposing the adhesive. Place the monitor on the chest in the vertical or horizontal position shown in  the instruction booklet. One arrow on the monitor strip must be pointing upward. Carefully remove paper labeled #2, attaching remainder of strip to your skin. Try not to create any folds or wrinkles in the strip as you apply it.  Firmly press and release the circle in the center of the monitor battery. You will hear a small beep. This is turning the monitor battery on. The heart emblem on the  monitor battery will light up every 5 seconds if the monitor battery in turned on and connected to the patient securely. Do not push and hold the circle down as this turns the monitor battery off. The cell phone will locate the monitor battery. A screen will appear on the cell phone checking the connection of your monitor strip. This may read poor connection initially but change to good connection within the next minute. Once your monitor accepts the connection you will hear a series of 3 beeps followed by a climbing crescendo of beeps. A screen will appear on the cell phone showing the two monitor strip placement options. Touch the picture that demonstrates where you applied the monitor strip.  Your monitor strip and battery are waterproof. You are able to shower, bathe, or swim with the monitor on. They just ask you do not submerge deeper than 3 feet underwater. We recommend removing the monitor if you are swimming in a lake, river, or ocean.  Your monitor battery will need to be switched to a fully charged monitor battery approximately once a week. The cell phone will alert you of an action which needs to be made.  On the cell phone, tap for details to reveal connection status, monitor battery status, and cell phone battery status. The green dots indicates your monitor is in good status. A red dot indicates there is something that needs your attention.  To record a symptom, click the circle on the monitor battery. In 30-60 seconds a list of symptoms will appear on the cell phone. Select your symptom and tap save. Your monitor will record a sustained or significant arrhythmia regardless of you clicking the button. Some patients do not feel the heart rhythm irregularities. [employer] will notify us of any serious or critical events.  Refer to instruction booklet for instructions on switching batteries, changing strips, the Do not disturb or Pause features, or any additional  questions.  Call [employer] at [phone], to confirm your monitor is transmitting and record your baseline. They will answer any questions you may have regarding the monitor instructions at that time.  Returning the monitor to [place] all equipment back into blue box. Peel off strip of paper to expose adhesive and close box securely. There is a prepaid UPS shipping label on this box. Drop in a UPS drop box, or at a UPS facility like [employer]. You may also contact [employer] to arrange UPS to pick up monitor package at your home.

## 2022-05-28 NOTE — Telephone Encounter (Signed)
Patient was seen today in clinic by Dr. Harl Bowie.

## 2022-05-29 ENCOUNTER — Ambulatory Visit (INDEPENDENT_AMBULATORY_CARE_PROVIDER_SITE_OTHER): Payer: Medicare Other | Admitting: Neurology

## 2022-05-29 ENCOUNTER — Encounter: Payer: Self-pay | Admitting: Neurology

## 2022-05-29 VITALS — BP 133/61 | HR 69 | Ht 62.0 in | Wt 167.0 lb

## 2022-05-29 DIAGNOSIS — I252 Old myocardial infarction: Secondary | ICD-10-CM

## 2022-05-29 DIAGNOSIS — M3509 Sicca syndrome with other organ involvement: Secondary | ICD-10-CM

## 2022-05-29 DIAGNOSIS — G8929 Other chronic pain: Secondary | ICD-10-CM

## 2022-05-29 DIAGNOSIS — H539 Unspecified visual disturbance: Secondary | ICD-10-CM

## 2022-05-29 DIAGNOSIS — I2510 Atherosclerotic heart disease of native coronary artery without angina pectoris: Secondary | ICD-10-CM | POA: Diagnosis not present

## 2022-05-29 DIAGNOSIS — M4306 Spondylolysis, lumbar region: Secondary | ICD-10-CM

## 2022-05-29 DIAGNOSIS — Z79899 Other long term (current) drug therapy: Secondary | ICD-10-CM

## 2022-05-29 DIAGNOSIS — G35 Multiple sclerosis: Secondary | ICD-10-CM

## 2022-05-29 DIAGNOSIS — R269 Unspecified abnormalities of gait and mobility: Secondary | ICD-10-CM | POA: Diagnosis not present

## 2022-05-29 DIAGNOSIS — M545 Low back pain, unspecified: Secondary | ICD-10-CM | POA: Diagnosis not present

## 2022-05-29 DIAGNOSIS — I639 Cerebral infarction, unspecified: Secondary | ICD-10-CM

## 2022-05-29 NOTE — Progress Notes (Signed)
[facility]  PATIENT: Dawn [name] DOB: 06-30-1946  REFERRING CLINICIAN: Aura Dials HISTORY FROM: Patient  REASON FOR VISIT: MS   HISTORICAL  CHIEF COMPLAINT:  Chief Complaint  Patient presents with   Follow-up    Pt in room #11 and alone. Pt here today to f/u on leflunomide for her MS and discuss MRI.    HISTORY OF PRESENT ILLNESS:  Dawn [name] is a 76 y.o. woman who was diagnosed with multiple sclerosis in 2001.      Update 05/12/2022: MRI 2 weeks ago showed a subacute stroke (left medial parietal lobe).  The week before, she noted difficulty with reading and also had mild word finding difficulty.   Reading has returned to normal but she still notes mild word finding issues.  We checked a carotid ultrasoud last week 05/21/2022 and it was normal for age.      Because of her cardiac history, she also saw cardiology.  She had a cardiac stent placed after presenting with chest pain and having a cardiac catheter one plus year ago.   She had been on ASA/Plavix  x 1 year but was on just ASA at the time of the stroke She was already on carvedilol and dose was increased.  .   Crestor was increased but she had hand pain and is hoping to switch.    She will be getting a 30 day heart monitor.    Years ago, she had a left PICA cerebellar stroke and we had started ASA  She continues to note 'ocular migraine' symptoms.  These are more to the left that is inconsistent with her stroke.  However, she had noted more of these occurring a few days before she went to the emergency room.  Blood pressure has been high..   She has no recent chest pain.           She is on Leflunomide for Sjogren's and MS.    Eyes are ok but  mouth is dry.  She does not feel the Sjogrens is any better on lefluomide.      She is on several BP med's:  carvedilol, hydralazone, losartan.        She had a nerve block/RFA for her back and gait is mildly better.   Although back is a bit better,  legs are still hurting though.  She was doing Tai Chi and swimming at the [facility] but is now just floating some rather than laps  Walking is painful.    She notes pain is increased by extending her back and decreased by flexion (can walk better with shopping cart).      Her gait is reduced.  She feels balance is poor and also has pain which affects gait.   She feels lightheaded.   Many med's ere changed by cardiology.     She uses a cane due to the right leg giving out at times.   She is noting numbness but less pain in her hands.  At night, she gets painful tingling in her legs.   She has urianry urgency.    She is sleeping poorly with tingling and cramps in legs and also have hot flashes.   Clonazepam helps insomnia some and spasticity at night as much as it used to.   It helps her fall asleep but then she wakes up 3 hours later.   She feels her cognition is about the same as last visit.  She notes some memory difficulties and is forgetful.     She denies anxiety or depression at this time.   She has fatigue and takes 1 Provigil every morning and sometimes another 1/2 pill in the afternoon.   She generally eats well with a Mediterranean style diet.       _______________________________________________ MS History:    In 1994, she had an episode of severe vertigo with gait ataxia. An MRI at that time was reportedly normal. The symptoms were felt to be due to allergies. About 7 years later she had vertigo, gait ataxia and diplopia. Also her left leg was giving out. Short after that she had a hysterectomy. Postoperatively, she was numb in both legs and she had a lumbar puncture which showed changes consistent with MS. Then, an MRI of the brain was performed showing changes consistent with MS. She was referred to Dr. Erling Cruz who her on Betaseron. Last year, she switched from Betaseron to Tecfidera, in the hope that she would be able to avoid shots as she was having severe skin reactions with knots.     However, she had a lot of difficulty tolerating Tecfidera and swithced to [place] in early 2016 and to [place] late 2016.  Switched to Leflunomide for insurance reasons in 2019.    MRI brain 09/13/2018 shows multiple T2/flair hyperintense foci in the hemispheres.  The pattern is consistent with chronic demyelinating plaque associated with multiple sclerosis though some foci likely also represent chronic microvascular ischemic change.  None of the foci enhance or appear to be acute and there is no change compared to the 2017 MRI.      Remote left cerebellar infarction.      There is a normal enhancement pattern and no acute findings.  MRI brain 11/17/2019 showed Remote left PICA distribution CVA in the left cerebellar hemisphere.  This appears unchanged compared to the 2020 MRI.      Scattered T2/FLAIR hyperintense foci in the hemispheres.  Some foci are juxtacortical and periventricular consistent with multiple sclerosis though other foci are more nonspecific and could also be seen with chronic microvascular ischemic change.  None of the foci appear to be acute.  There are no new lesions compared to the 09/13/2018 MRI.  MRI cervical spine showed a normal spinal cord.     At C5-C6 there is a right paramedian disc protrusion causing mild right foraminal narrowing and mild spinal stenosis but no nerve root compression.   Minimal degenerative changes at C2-C3 and C3-C4 detailed above that do not lead to spinal stenosis or nerve root compression.    REVIEW OF SYSTEMS:  Constitutional: No fevers, chills, sweats, or change in appetite.   Reports fatigue Eyes: No visual changes, double vision, eye pain Ear, nose and throat: No hearing loss, ear pain, nasal congestion, sore throat Cardiovascular: No chest pain, palpitations Respiratory:  No shortness of breath at rest or with exertion.   No wheezes GastrointestinaI: No nausea, vomiting, diarrhea, abdominal pain, fecal incontinence Genitourinary:  see  above. Musculoskeletal:  Pain in left greater than right hip Integumentary: No rash, pruritus.    Neurological: as above Psychiatric: No depression at this time.  No anxiety Endocrine: No palpitations, diaphoresis, change in appetite, change in weigh or increased thirst Hematologic/Lymphatic:  No anemia, purpura, petechiae. Allergic/Immunologic: No itchy/runny eyes, nasal congestion, recent allergic reactions, rashes  ALLERGIES: Allergies  Allergen Reactions   Statins Other (See Comments)    Muscle pain; Tolerates Crestor     HOME MEDICATIONS:  Outpatient Medications Prior to Visit  Medication Sig Dispense Refill   acetaminophen (TYLENOL) 500 MG tablet Take 1,000 mg by mouth every 6 (six) hours as needed for mild pain or moderate pain.     aspirin EC 81 MG tablet Take 81 mg by mouth daily. Swallow whole.     carvedilol (COREG) 6.25 MG tablet Take 1 tablet (6.25 mg total) by mouth 2 (two) times daily with a meal. 180 tablet 3   cholecalciferol (VITAMIN D) 25 MCG (1000 UNIT) tablet Take 2,000 Units by mouth daily.     clonazePAM (KLONOPIN) 0.5 MG tablet TAKE 1 TABLET(0.5 MG) BY MOUTH AT BEDTIME (Patient taking differently: Take 0.5 mg by mouth at bedtime.) 90 tablet 1   fexofenadine (ALLEGRA) 180 MG tablet Take 180 mg by mouth at bedtime.      Flaxseed, Linseed, (FLAXSEED OIL PO) Take 5-10 mLs by mouth 3 (three) times a week. Power     fluticasone (FLONASE) 50 MCG/ACT nasal spray Place 1 spray into both nostrils daily as needed for allergies or rhinitis.     hydrALAZINE (APRESOLINE) 25 MG tablet Take 1 tablet (25 mg total) by mouth 3 (three) times daily. 360 tablet 2   Lidocaine 4 % PTCH Place 1 patch onto the skin daily as needed (mild pain). Remove & Discard patch within 12 hours or as directed by MD     losartan (COZAAR) 100 MG tablet TAKE 1 TABLET(100 MG) BY MOUTH DAILY (Patient taking differently: Take 100 mg by mouth daily.) 90 tablet 2   melatonin 5 MG TABS Take 5 mg by mouth at  bedtime as needed (Sleep).     modafinil (PROVIGIL) 200 MG tablet TAKE 1 TABLET(200 MG) BY MOUTH IN THE MORNING (Patient taking differently: Take 200 mg by mouth in the morning.) 90 tablet 1   nitroGLYCERIN (NITROSTAT) 0.4 MG SL tablet Place 1 tablet (0.4 mg total) under the tongue every 5 (five) minutes as needed for chest pain. 30 tablet 0   pantoprazole (PROTONIX) 40 MG tablet Take 1 tablet (40 mg total) by mouth daily. 30 tablet 11   rosuvastatin (CRESTOR) 20 MG tablet Take 1 tablet (20 mg total) by mouth daily. 30 tablet 3   solifenacin (VESICARE) 5 MG tablet Take 5 mg by mouth daily.     leflunomide (ARAVA) 20 MG tablet TAKE 1 TABLET(20 MG) BY MOUTH DAILY (Patient taking differently: Take 20 mg by mouth daily.) 90 tablet 3   No facility-administered medications prior to visit.    PAST MEDICAL HISTORY: Past Medical History:  Diagnosis Date   Allergy    CAD (coronary artery disease)    s/p DES to RCA in 06/2021   Dry eyes    Endometrial polyp    Essential hypertension 08/25/2018   GERD (gastroesophageal reflux disease)    History of hiatal hernia    History of kidney stones    Hot flashes, menopausal 10/13/2011   Estradiol started    Hyperlipidemia    Jaundice as teenager   no problems since   Memory loss 09/21/2019     1/2 of feet numb all the time   Multiple sclerosis ([place]) dx 2001   Neuropathy    bilateral feet   Osteoarthritis    Sjogren's syndrome ([place]) dx oct 2021   sore muscvles, dry mouth and eyes   Stroke ([place])    Vertigo    Vision abnormalities     PAST SURGICAL HISTORY: Past Surgical History:  Procedure Laterality Date  ABDOMINAL HYSTERECTOMY  2002   partial   COLONOSCOPY  01/27/2022   2 day prep   colonscopy  2011   CORONARY STENT INTERVENTION N/A 07/18/2021   Procedure: CORONARY STENT INTERVENTION;  Surgeon: Jettie Booze, MD;  Location: [facility];  Service: Cardiovascular;  Laterality: N/A;   DILATATION & CURETTAGE/HYSTEROSCOPY  WITH MYOSURE N/A 06/08/2020   Procedure: DILATATION & CURETTAGE/HYSTEROSCOPY/Polypectomy WITH MYOSURE;  Surgeon: Armandina Stammer, DO;  Location: [facility];  Service: Gynecology;  Laterality: N/A;   LEFT HEART CATH AND CORONARY ANGIOGRAPHY N/A 07/18/2021   Procedure: LEFT HEART CATH AND CORONARY ANGIOGRAPHY;  Surgeon: Jettie Booze, MD;  Location: [facility];  Service: Cardiovascular;  Laterality: N/A;   LUMBAR FUSION  2001   TOTAL HIP ARTHROPLASTY Right 01/17/2021   Procedure: TOTAL HIP ARTHROPLASTY ANTERIOR APPROACH;  Surgeon: Rod Can, MD;  Location: [facility];  Service: Orthopedics;  Laterality: Right;   UPPER GI ENDOSCOPY  yrs ago    FAMILY HISTORY: Family History  Problem Relation Age of Onset   Heart failure Mother    Heart failure Father    Arthritis Other    Hypertension Other    Colon cancer Neg Hx    Colon polyps Neg Hx    Esophageal cancer Neg Hx    Stomach cancer Neg Hx    Rectal cancer Neg Hx     SOCIAL HISTORY:  Social History   Socioeconomic History   Marital status: Widowed    Spouse name: Not on file   Number of children: Not on file   Years of education: Not on file   Highest education level: Not on file  Occupational History   Occupation: retired  Tobacco Use   Smoking status: Former    Packs/day: 0.50    Years: 15.00    Total pack years: 7.50    Types: Cigarettes    Quit date: 08/05/1983    Years since quitting: 38.8   Smokeless tobacco: Never  Vaping Use   Vaping Use: Never used  Substance and Sexual Activity   Alcohol use: Yes    Comment: rarely, maybe yearly drink   Drug use: Never   Sexual activity: Not Currently    Birth control/protection: Post-menopausal  Other Topics Concern   Not on file  Social History Narrative   Regular Exercise-no   Widowed   Retired   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     Tobacco use, amount per day now: former   Past tobacco use, amount per day: 1/2-1 packet   How many years did you use tobacco: 12 years   Alcohol use (drinks per week): 0   Diet: mediterranean based   Do you drink/eat things with caffeine: yes   Marital status:        widow                          What year were you married? 1987   Do you live in a house, apartment, assisted living, condo, trailer, etc.? house   Is it one or more stories? 1 story   How many persons live in your home? 1   Do you have pets in your home?( please list) no   Current or  past profession: Science writer    Do you exercise?          yes                        Type and how often? Tai chi and pt therapy, stretches etc....   Do you have a living will? yes   Do you have a DNR form?      no                             If not, do you want to discuss one? yes   Do you have signed POA/HPOA forms?      no                  If so, please bring to you appointment   Social Determinants of Health   Financial Resource Strain: Not on file  Food Insecurity: No Food Insecurity (01/07/2022)   Hunger Vital Sign    Worried About Running Out of Food in the Last Year: Never true    Ran Out of Food in the Last Year: Never true  Transportation Needs: No Transportation Needs (01/07/2022)   PRAPARE - Hydrologist (Medical): No    Lack of Transportation (Non-Medical): No  Physical Activity: Not on file  Stress: Not on file  Social Connections: Not on file  Intimate Partner Violence: Not At Risk (02/03/2022)   Humiliation, Afraid, Rape, and Kick questionnaire    Fear of Current or Ex-Partner: No    Emotionally Abused: No    Physically Abused: No    Sexually Abused: No     PHYSICAL EXAM  Vitals:   05/29/22 1511  BP: 133/61  Pulse: 69  Weight: 167 lb (75.8 kg)  Height: _0  (1.575 m)    Body mass index is 30.54  kg/m.   General: The patient is well-developed and well-nourished and in no acute distress  Musculoskeletal: She has mild lumbar tenderness and mild trochanteric bursa tenderness.  Neurologic Exam  Mental status: The patient is alert and oriented x 3 at the time of the examination.   apparently normal attention span, stm and concentration ability.   Speech is normal.      Cranial nerves: Extraocular movements are full.  Visual fields are normal.  Facial strength and sensory are normal.  Hearing appeared to be symmetric  Motor:  Muscle bulk is normal but tone is symmetric, more on the left.. Strength is 4/5 in median innervated and 4+/5 and ulnar innervated intrinsic hand muscles   Sensory:   Sensory testing is intact to touch and vibration in the arms.  She has reduced vibration sensation in the feet.  Coordination: Finger-nose-finger and heel-to-shin is performed better on the right than the left.  Gait and station: Station is normal.  The gait is wide.  Tandem gait is poor.  Romberg is negative.  Reflexes: Deep tendon reflexes are symmetric and normal bilaterally.      DIAGNOSTIC DATA (LABS, IMAGING, TESTING) - I reviewed patient records, labs, notes, testing and imaging myself where available.  Lab Results  Component Value Date   WBC 4.5 05/14/2022   HGB 13.3 05/14/2022   HCT 39.0 05/14/2022   MCV 93.0 05/14/2022   PLT 183 05/14/2022      Component Value Date/Time   NA 138 05/14/2022 1755   NA 138 07/12/2021 1031  K 3.9 05/14/2022 1755   CL 107 05/14/2022 1755   CO2 21 (L) 05/14/2022 1720   GLUCOSE 78 05/14/2022 1755   BUN 20 05/14/2022 1755   BUN 16 07/12/2021 1031   CREATININE 0.70 05/14/2022 1755   CREATININE 0.69 04/18/2022 0845   CALCIUM 9.4 05/14/2022 1720   PROT 6.3 (L) 05/14/2022 1720   PROT 6.1 10/02/2020 1607   ALBUMIN 4.0 05/14/2022 1720   ALBUMIN 4.2 10/02/2020 1607   AST 21 05/14/2022 1720   AST 20 03/09/2018 1342   ALT 14 05/14/2022 1720    ALT 22 03/09/2018 1342   ALKPHOS 93 05/14/2022 1720   BILITOT 0.5 05/14/2022 1720   BILITOT <0.2 10/02/2020 1607   BILITOT 0.4 03/09/2018 1342   GFRNONAA >60 05/14/2022 1720   GFRNONAA 91 01/25/2021 1527   GFRAA 105 01/25/2021 1527   Lab Results  Component Value Date   CHOL 146 04/18/2022   HDL 44 (L) 04/18/2022   LDLCALC 78 04/18/2022   LDLDIRECT 130.2 11/03/2011   TRIG 144 04/18/2022   CHOLHDL 3.3 04/18/2022   Lab Results  Component Value Date   HGBA1C 5.4 06/15/2021   No results found for: "VITAMINB12" Lab Results  Component Value Date   TSH 5.41 (H) 04/18/2022       ASSESSMENT AND PLAN  Cerebrovascular accident (CVA), unspecified mechanism (HCC)  Multiple sclerosis (HCC)  Abnormality of gait  High risk medication use  Lumbar spondylolysis  Chronic bilateral low back pain without sciatica  Transient vision disturbance  Sjogren's syndrome with other organ involvement (HCC)  History of MI (myocardial infarction)   1.    Her MS has been stable x years and we discussed stopping leflunomide (she does not think it has heped Sjogren's either).    2.    She had a stroke several weeks ago.  The carotid Dopplers were normal.  She will be getting a 30-day monitor.  She had a history of myocardial infarction last year 3.    Continue  clonazepam and modafinil.     4.    Continue seeing Dr. Ron Agee for lumbar facet syndrome 5.    Return to see me in about 6 months but is advised to call sooner if she has new or worsening neurologic symptoms.  42-minute office visit with the majority of the time spent face-to-face for history and physical, discussion/counseling and decision-making.  Additional time with record review (including recent ED visit/labs/imaging) and documentation.    Alyssa Rotondo A. Felecia Shelling, MD, PhD 16/07/958, 4:54 PM Certified in Neurology, Clinical Neurophysiology, Sleep Medicine, Pain Medicine and Neuroimaging  [facility] [address], [place], [place] [address] [phone]-' \

## 2022-06-02 DIAGNOSIS — M7062 Trochanteric bursitis, left hip: Secondary | ICD-10-CM | POA: Diagnosis not present

## 2022-06-02 DIAGNOSIS — M7061 Trochanteric bursitis, right hip: Secondary | ICD-10-CM | POA: Diagnosis not present

## 2022-06-08 ENCOUNTER — Ambulatory Visit: Payer: Medicare Other | Attending: Internal Medicine

## 2022-06-08 DIAGNOSIS — I4891 Unspecified atrial fibrillation: Secondary | ICD-10-CM

## 2022-06-08 DIAGNOSIS — I639 Cerebral infarction, unspecified: Secondary | ICD-10-CM

## 2022-06-16 DIAGNOSIS — H0011 Chalazion right upper eyelid: Secondary | ICD-10-CM | POA: Diagnosis not present

## 2022-06-26 ENCOUNTER — Telehealth: Payer: Self-pay | Admitting: Internal Medicine

## 2022-06-26 NOTE — Telephone Encounter (Signed)
Called patient, LVM to call back to discuss.  Left call back number.   

## 2022-06-26 NOTE — Telephone Encounter (Signed)
Spoke with patient, advised that she has always had issues with the Crestor, however since the increase the muscle and joint pain has worsened. I did advise she was needing to see pharmacy team for cholesterol management, however patient states that right now is not a good time for her to come in due to her MS. She states she would like to know if the medication can be switch for her to something else that does not cause muscle aches.   I advised I would send to MD and RN to review.   Thank you!

## 2022-06-26 NOTE — Telephone Encounter (Signed)
Pt c/o medication issue:  1. Name of Medication: Rosuvastatin  2. How are you currently taking this medication (dosage and times per day)? 1 time a day  3. Are you having a reaction (difficulty breathing--STAT)?   4. What is your medication issue? Having side muscle pain and body aching

## 2022-06-26 NOTE — Telephone Encounter (Signed)
Patient is returning call.  °

## 2022-06-27 NOTE — Telephone Encounter (Signed)
Called patient, advised of message from MD.  Patient verbalized understanding.   

## 2022-07-01 ENCOUNTER — Ambulatory Visit: Payer: Medicare Other

## 2022-07-01 DIAGNOSIS — M5416 Radiculopathy, lumbar region: Secondary | ICD-10-CM | POA: Diagnosis not present

## 2022-07-01 DIAGNOSIS — M545 Low back pain, unspecified: Secondary | ICD-10-CM | POA: Diagnosis not present

## 2022-07-02 ENCOUNTER — Other Ambulatory Visit: Payer: Self-pay | Admitting: Neurology

## 2022-07-11 DIAGNOSIS — H0011 Chalazion right upper eyelid: Secondary | ICD-10-CM | POA: Diagnosis not present

## 2022-07-16 ENCOUNTER — Other Ambulatory Visit: Payer: Self-pay | Admitting: Internal Medicine

## 2022-07-16 ENCOUNTER — Telehealth: Payer: Self-pay | Admitting: Internal Medicine

## 2022-07-16 MED ORDER — CARVEDILOL 6.25 MG PO TABS: 6.2500 mg | ORAL_TABLET | Freq: Two times a day (BID) | ORAL | 3 refills | Status: DC

## 2022-07-16 NOTE — Telephone Encounter (Signed)
*  STAT* If patient is at the pharmacy, call can be transferred to refill team.   1. Which medications need to be refilled? (please list name of each medication and dose if known)   carvedilol (COREG) 6.25 MG tablet   2. Which pharmacy/location (including street and city if local pharmacy) is medication to be sent to?  [id number] - [place], [place] [address]   3. Do they need a 30 day or 90 day supply?   90 day  Patient stated she has 3 tablets left.

## 2022-07-23 ENCOUNTER — Other Ambulatory Visit: Payer: Medicare Other

## 2022-07-23 DIAGNOSIS — I5032 Chronic diastolic (congestive) heart failure: Secondary | ICD-10-CM

## 2022-07-23 DIAGNOSIS — I10 Essential (primary) hypertension: Secondary | ICD-10-CM | POA: Diagnosis not present

## 2022-07-23 DIAGNOSIS — E782 Mixed hyperlipidemia: Secondary | ICD-10-CM | POA: Diagnosis not present

## 2022-07-24 LAB — CBC WITH DIFFERENTIAL/PLATELET
Absolute Monocytes: 374 cells/uL (ref 200–950)
Basophils Absolute: 50 cells/uL (ref 0–200)
Basophils Relative: 0.9 %
Eosinophils Absolute: 110 cells/uL (ref 15–500)
Eosinophils Relative: 2 %
HCT: 39.4 % (ref 35.0–45.0)
Hemoglobin: 13.2 g/dL (ref 11.7–15.5)
Lymphs Abs: 765 cells/uL — ABNORMAL LOW (ref 850–3900)
MCH: 30.6 pg (ref 27.0–33.0)
MCHC: 33.5 g/dL (ref 32.0–36.0)
MCV: 91.2 fL (ref 80.0–100.0)
MPV: 11.6 fL (ref 7.5–12.5)
Monocytes Relative: 6.8 %
Neutro Abs: 4202 cells/uL (ref 1500–7800)
Neutrophils Relative %: 76.4 %
Platelets: 200 10*3/uL (ref 140–400)
RBC: 4.32 10*6/uL (ref 3.80–5.10)
RDW: 13.4 % (ref 11.0–15.0)
Total Lymphocyte: 13.9 %
WBC: 5.5 10*3/uL (ref 3.8–10.8)

## 2022-07-24 LAB — COMPLETE METABOLIC PANEL WITH GFR
AG Ratio: 2.2 (calc) (ref 1.0–2.5)
ALT: 17 U/L (ref 6–29)
AST: 18 U/L (ref 10–35)
Albumin: 4.2 g/dL (ref 3.6–5.1)
Alkaline phosphatase (APISO): 105 U/L (ref 37–153)
BUN: 17 mg/dL (ref 7–25)
CO2: 28 mmol/L (ref 20–32)
Calcium: 9 mg/dL (ref 8.6–10.4)
Chloride: 102 mmol/L (ref 98–110)
Creat: 0.68 mg/dL (ref 0.60–1.00)
Globulin: 1.9 g/dL (calc) (ref 1.9–3.7)
Glucose, Bld: 80 mg/dL (ref 65–99)
Potassium: 4.2 mmol/L (ref 3.5–5.3)
Sodium: 137 mmol/L (ref 135–146)
Total Bilirubin: 0.4 mg/dL (ref 0.2–1.2)
Total Protein: 6.1 g/dL (ref 6.1–8.1)
eGFR: 91 mL/min/{1.73_m2} (ref >=60)

## 2022-07-24 LAB — LIPID PANEL
Cholesterol: 155 mg/dL (ref <200)
HDL: 64 mg/dL (ref >=50)
LDL Cholesterol (Calc): 73 mg/dL (calc)
Non-HDL Cholesterol (Calc): 91 mg/dL (calc) (ref <130)
Total CHOL/HDL Ratio: 2.4 (calc) (ref <5.0)
Triglycerides: 98 mg/dL (ref <150)

## 2022-07-24 LAB — TSH: TSH: 4.29 mIU/L (ref 0.40–4.50)

## 2022-07-29 ENCOUNTER — Other Ambulatory Visit: Payer: Self-pay | Admitting: Internal Medicine

## 2022-07-31 DIAGNOSIS — M5416 Radiculopathy, lumbar region: Secondary | ICD-10-CM | POA: Diagnosis not present

## 2022-08-01 ENCOUNTER — Telehealth: Payer: Self-pay

## 2022-08-01 DIAGNOSIS — E782 Mixed hyperlipidemia: Secondary | ICD-10-CM

## 2022-08-01 DIAGNOSIS — Z8673 Personal history of transient ischemic attack (TIA), and cerebral infarction without residual deficits: Secondary | ICD-10-CM

## 2022-08-01 DIAGNOSIS — I10 Essential (primary) hypertension: Secondary | ICD-10-CM

## 2022-08-01 DIAGNOSIS — I700 Atherosclerosis of aorta: Secondary | ICD-10-CM

## 2022-08-01 DIAGNOSIS — I2510 Atherosclerotic heart disease of native coronary artery without angina pectoris: Secondary | ICD-10-CM

## 2022-08-01 NOTE — Telephone Encounter (Signed)
Cardiology refer to [facility] ordered as requested.

## 2022-08-01 NOTE — Telephone Encounter (Signed)
Patient called stating she has heard good things about Dr.Amada Acres, Tiffany (cardiologist) and is asking if Webb Silversmith will refer her to see Dr.Duque at the [facility] location.

## 2022-08-04 ENCOUNTER — Encounter (HOSPITAL_BASED_OUTPATIENT_CLINIC_OR_DEPARTMENT_OTHER): Payer: Self-pay | Admitting: Physical Therapy

## 2022-08-04 ENCOUNTER — Ambulatory Visit (HOSPITAL_BASED_OUTPATIENT_CLINIC_OR_DEPARTMENT_OTHER): Payer: Medicare Other | Attending: Physical Medicine and Rehabilitation | Admitting: Physical Therapy

## 2022-08-04 DIAGNOSIS — R262 Difficulty in walking, not elsewhere classified: Secondary | ICD-10-CM | POA: Insufficient documentation

## 2022-08-04 DIAGNOSIS — R252 Cramp and spasm: Secondary | ICD-10-CM

## 2022-08-04 DIAGNOSIS — M5459 Other low back pain: Secondary | ICD-10-CM | POA: Diagnosis not present

## 2022-08-04 DIAGNOSIS — M6281 Muscle weakness (generalized): Secondary | ICD-10-CM | POA: Insufficient documentation

## 2022-08-04 DIAGNOSIS — M545 Low back pain, unspecified: Secondary | ICD-10-CM | POA: Insufficient documentation

## 2022-08-04 NOTE — Therapy (Signed)
OUTPATIENT PHYSICAL THERAPY THORACOLUMBAR EVALUATION   Patient Name: Dawn [name] MRN: [id number] DOB:Nov 05, 1945, 77 y.o., female Today's Date: 08/04/2022  END OF SESSION:  PT End of Session - 08/04/22 1034     Visit Number 1    Number of Visits 16    Date for PT Re-Evaluation 11/24/22    Authorization Type progress note at 73    PT Start Time 0800    PT Stop Time 0844    PT Time Calculation (min) 44 min    Activity Tolerance Patient tolerated treatment well    Behavior During Therapy [facility] for tasks assessed/performed             Past Medical History:  Diagnosis Date   Allergy    CAD (coronary artery disease)    s/p DES to RCA in 06/2021   Dry eyes    Endometrial polyp    Essential hypertension 08/25/2018   GERD (gastroesophageal reflux disease)    History of hiatal hernia    History of kidney stones    Hot flashes, menopausal 10/13/2011   Estradiol started    Hyperlipidemia    Jaundice as teenager   no problems since   Memory loss 09/21/2019     1/2 of feet numb all the time   Multiple sclerosis ([place]) dx 2001   Neuropathy    bilateral feet   Osteoarthritis    Sjogren's syndrome ([place]) dx oct 2021   sore muscvles, dry mouth and eyes   Stroke ([place])    Vertigo    Vision abnormalities    Past Surgical History:  Procedure Laterality Date   ABDOMINAL HYSTERECTOMY  2002   partial   COLONOSCOPY  01/27/2022   2 day prep   colonscopy  2011   CORONARY STENT INTERVENTION N/A 07/18/2021   Procedure: CORONARY STENT INTERVENTION;  Surgeon: Jettie Booze, MD;  Location: [place] [facility];  Service: Cardiovascular;  Laterality: N/A;   DILATATION & CURETTAGE/HYSTEROSCOPY WITH MYOSURE N/A 06/08/2020   Procedure: DILATATION & CURETTAGE/HYSTEROSCOPY/Polypectomy WITH MYOSURE;  Surgeon: Armandina Stammer, DO;  Location: [place];  Service: Gynecology;  Laterality: N/A;   LEFT HEART CATH AND CORONARY ANGIOGRAPHY N/A 07/18/2021   Procedure: LEFT  HEART CATH AND CORONARY ANGIOGRAPHY;  Surgeon: Jettie Booze, MD;  Location: [place] [facility];  Service: Cardiovascular;  Laterality: N/A;   LUMBAR FUSION  2001   TOTAL HIP ARTHROPLASTY Right 01/17/2021   Procedure: TOTAL HIP ARTHROPLASTY ANTERIOR APPROACH;  Surgeon: Rod Can, MD;  Location: [facility];  Service: Orthopedics;  Laterality: Right;   UPPER GI ENDOSCOPY  yrs ago   Patient Active Problem List   Diagnosis Date Noted   Positive colorectal cancer screening using Cologuard test 11/14/2021   Coronary artery disease    Statin intolerance 04/19/2021   Osteoarthritis of right hip 01/17/2021   Status post THR (total hip replacement) 01/17/2021   Chronic bilateral low back pain without sciatica 10/02/2020   Chronic pain syndrome 06/20/2020   Post laminectomy syndrome 06/20/2020   Sjogren's disease ([place]) 05/23/2020   Primary osteoarthritis of both hands 05/23/2020   Primary osteoarthritis of both feet 05/23/2020   Other secondary scoliosis, lumbar region 12/22/2019   Spondylosis without myelopathy or radiculopathy, lumbar region 12/22/2019   Cervical radiculopathy 10/18/2019   Numbness 09/21/2019   Bilateral carpal tunnel syndrome 09/21/2019   High risk medication use 09/21/2019   Memory loss 09/21/2019   Essential hypertension 08/25/2018   Abnormal SPEP 02/19/2018  Hand pain 02/20/2017   Multiple joint pain 02/18/2017   Disturbed cognition 06/25/2016   Sciatica, right side 10/30/2015   Chronic fatigue 08/04/2014   Gait disturbance 08/04/2014   Unspecified visual disturbance 01/31/2013   Transient vision disturbance 01/31/2013   Colon polyps 11/03/2011   POSTHERPETIC NEURALGIA 10/03/2009   DISPLCMT LUMBAR INTERVERT DISC W/O MYELOPATHY 09/05/2009   RENAL CALCULUS, RECURRENT 09/04/2009   Hyperlipidemia 08/31/2009   Multiple sclerosis ([place]) 08/31/2009   ALLERGIC RHINITIS 08/31/2009    PCP: Lynford Humphrey NP  REFERRING PROVIDER: Dr Laroy Apple   REFERRING  DIAG: Low Back Pain   Rationale for Evaluation and Treatment: Rehabilitation  THERAPY DIAG:  Difficulty in walking, not elsewhere classified  Cramp and spasm  Other low back pain  Muscle weakness (generalized)  ONSET DATE: About 11 days ago her nerve abiliation wore off.   SUBJECTIVE:                                                                                                                                                                                           SUBJECTIVE STATEMENT: Patient had previously been seen in physical therapy up until December.  At that time she had a nerve ablation in her lower back.  She reports about 3 months of improved pain in her low back following the nerve ablation.  About 11 days ago she had a acute onset of lower back pain that has persisted.  She feels like this is affecting her mobility and her ability to be able to sleep.  She does have a concurrent increase in hip pain as well.  PERTINENT HISTORY:  Right Hip replacement 2022, Low back Pain, MS, TIA October 2023; MI, November 2022; CAD, Sjorgens syndrome  PAIN:  Are you having pain? Yes: NPRS scale: 4/10 now 8/10 at worst  Pain location: low back pain  Pain description: aching  Aggravating factors: standing and walking  Relieving factors: rest   Yes: NPRS scale: 4/10 right now Pain location: aching  Pain description: burning and aching  Aggravating factors: standing and walking  Relieving factors: rest   PRECAUTIONS: None  WEIGHT BEARING RESTRICTIONS: No  FALLS:  Has patient fallen in last 6 months? 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   LUMBAR ROM:   AROM eval  Flexion Limited 50 % with pain   Extension No limit   Right lateral flexion   Left lateral flexion   Right rotation Limited 50% with pain   Left rotation Limited 50% with pain    (Blank rows = not tested)  LOWER EXTREMITY ROM:     Passive  Right eval Left eval  Hip flexion 105 90  Hip extension    Hip abduction    Hip adduction    Hip internal rotation painful Painful   Hip external rotation    Knee flexion    Knee extension    Ankle dorsiflexion    Ankle plantarflexion    Ankle inversion    Ankle eversion     (Blank rows = not tested)  LOWER EXTREMITY MMT:    MMT Right eval Left eval  Hip flexion 13.1 12.3  Hip extension    Hip abduction 11.5 10.1  Hip adduction    Hip internal rotation    Hip external rotation    Knee flexion    Knee extension 14.0 13.3  Ankle dorsiflexion    Ankle plantarflexion    Ankle inversion    Ankle eversion     (Blank rows = not tested)  FUNCTIONAL TESTS:  5 times sit to stand: 28 sec   GAIT:  TODAY'S TREATMENT:                                                                                                                              DATE:  Has previous HEP  Manual: roller to bilateral hips   PATIENT EDUCATION:  Education details: reviewed HEP, symptom management  Person educated: Patient Education method: Explanation, Demonstration, Tactile cues, Verbal cues, and Handouts Education comprehension: verbalized understanding, returned demonstration, verbal cues required, tactile cues required, and needs further education  HOME EXERCISE PROGRAM: Has  previous HEP. Will review next visit  ASSESSMENT:  CLINICAL IMPRESSION: Patient is a 77 year old female who presents to physical therapy with significant low back pain, as well as significant bilateral hip pain.  She had a nerve ablation in December which helped until about 11 days ago when the pain returned.  At this time she feels her low back pain is affecting her ability to walk and stand, and to perform her ADLs.  She has significant limitations in lumbar flexion and extension, as well as rotation bilateral.  She has limited walking distance.  We will attempt a 6-minute walk test when her pain is more controlled.  She has significant tenderness to palpation in bilateral hips and into bilateral IT bands.  She also has spasming in  her lower back.  She has weakness in all major muscle groups.  When compared to her previous episode of physical therapy her numbers with all muscle groups has declined.  She would benefit from further skilled therapy to improve strength, functional mobility, and ability to ambulate in the community.  OBJECTIVE IMPAIRMENTS: Abnormal gait, decreased activity tolerance, decreased balance, decreased endurance, decreased knowledge of use of DME, decreased mobility, difficulty walking, decreased ROM, decreased strength, increased muscle spasms, and pain.   ACTIVITY LIMITATIONS: carrying, lifting, bending, standing, squatting, sleeping, stairs, transfers, and locomotion level  PARTICIPATION LIMITATIONS: meal prep, cleaning, laundry, driving, shopping, community activity, and yard work  PERSONAL FACTORS: Right Hip replacement 2022, Low back Pain, MS, TIA October 2023; MI, November 2022; CAD, Sjorgens syndrome are also affecting patient's functional outcome. MS  REHAB POTENTIAL: Good  CLINICAL DECISION MAKING: Evolving/moderate complexity declining mobility   EVALUATION COMPLEXITY: Moderate   GOALS: Goals reviewed with patient? No  SHORT TERM GOALS: Target date:  09/29/2022    Patient will increase gross bilateral lower extremity strength by 5 pounds Baseline: Goal status: INITIAL  2.  Patient will increase lumbar flexion by 10 degrees Baseline:  Goal status: INITIAL  3.  Patient will be independent with basic after exercise program and stretching program Baseline:  Goal status: INITIAL   LONG TERM GOALS: Target date: 09/29/2022     Patient will stand for greater than 30 minutes without increased pain Baseline:  Goal status: INITIAL  2.  Patient will pick item up off the floor without increased low back pain Baseline:  Goal status: INITIAL  3.  Patient will ambulate community distances without report of increased low back pain Baseline:  Goal status: INITIAL  4.  Patient will report improved ability to sleep through the night secondary to back pain Baseline:  Goal status: INITIAL    PLAN:  PT FREQUENCY: 1-2x/week  PT DURATION: 16 weeks   PLANNED INTERVENTIONS: Therapeutic exercises, Therapeutic activity, Neuromuscular re-education, Balance training, Gait training, Patient/Family education, Self Care, Joint mobilization, Stair training, DME instructions, Aquatic Therapy, Dry Needling, Spinal mobilization, Cryotherapy, Moist heat, Ionotophoresis '4mg'$ /ml Dexamethasone, and Manual therapy.  PLAN FOR NEXT SESSION: Consider manual therapy to the back and hips, consider trigger point dry needling to back and hips.  Reviewed basic HEP.  Begin active strengthening of hips and core.  Begin gait training and functional mobility training.   Carney Living, PT 08/04/2022, 10:36 AM

## 2022-08-11 ENCOUNTER — Ambulatory Visit: Payer: Medicare Other | Admitting: Neurology

## 2022-08-11 ENCOUNTER — Telehealth: Payer: Self-pay | Admitting: Internal Medicine

## 2022-08-11 NOTE — Telephone Encounter (Signed)
Patient stated that 2 days ago she started having lt ankle swelling and the the right started to swell. After hydrating, elevating feet, and drinking hibiscus, the swelling resolved; still puffy on left but much less. Had been eating out more lately and consuming more Na+ than usual. Recent BPs 130/17, P 64; 176/66, 62 137/66; and while on phone 148/68, P 67. She denies chest pain or SOB. Taking meds as prescribed.

## 2022-08-11 NOTE — Telephone Encounter (Signed)
Pt c/o swelling: STAT is pt has developed SOB within 24 hours  If swelling, where is the swelling located? LE  How much weight have you gained and in what time span? 7 lbs in a month  Have you gained 3 pounds in a day or 5 pounds in a week? No   Do you have a log of your daily weights (if so, list)? No   Are you currently taking a fluid pill? No   Are you currently SOB? Yes on Friday, 1/19  Have you traveled recently? No    Pt c/o BP issue: STAT if pt c/o blurred vision, one-sided weakness or slurred speech  1. What are your last 5 BP readings? 130/71; 64; 176/66; 62; 137/66  2. Are you having any other symptoms (ex. Dizziness, headache, blurred vision, passed out)? Swelling in LE, anxious  3. What is your BP issue? Experiencing abnormal BP

## 2022-08-11 NOTE — Telephone Encounter (Signed)
Informed patient of reply from Dr. Harl Bowie: "She can continue to monitor. If she has difficulty with shortness of breath at night or walking, she can come in for DOD."  Pt verbalized understanding.

## 2022-08-18 ENCOUNTER — Ambulatory Visit (HOSPITAL_BASED_OUTPATIENT_CLINIC_OR_DEPARTMENT_OTHER): Payer: Medicare Other | Admitting: Physical Therapy

## 2022-08-18 ENCOUNTER — Encounter (HOSPITAL_BASED_OUTPATIENT_CLINIC_OR_DEPARTMENT_OTHER): Payer: Self-pay | Admitting: Physical Therapy

## 2022-08-18 DIAGNOSIS — M5459 Other low back pain: Secondary | ICD-10-CM | POA: Diagnosis not present

## 2022-08-18 DIAGNOSIS — R262 Difficulty in walking, not elsewhere classified: Secondary | ICD-10-CM | POA: Diagnosis not present

## 2022-08-18 DIAGNOSIS — R252 Cramp and spasm: Secondary | ICD-10-CM

## 2022-08-18 DIAGNOSIS — M6281 Muscle weakness (generalized): Secondary | ICD-10-CM | POA: Diagnosis not present

## 2022-08-18 DIAGNOSIS — M545 Low back pain, unspecified: Secondary | ICD-10-CM | POA: Diagnosis not present

## 2022-08-18 NOTE — Therapy (Signed)
OUTPATIENT PHYSICAL THERAPY THORACOLUMBAR EVALUATION   Patient Name: BUSHRA MOCK MRN: [id number] DOB:Feb 12, 1946, 77 y.o., female Today's Date: 08/19/2022  END OF SESSION:  PT End of Session - 08/19/22 1026     Visit Number 2    Number of Visits 16    Date for PT Re-Evaluation 11/24/22    Authorization Type progress note at 56    PT Start Time 1145    PT Stop Time 1228    PT Time Calculation (min) 43 min    Activity Tolerance Patient tolerated treatment well    Behavior During Therapy [facility] for tasks assessed/performed             Past Medical History:  Diagnosis Date   Allergy    CAD (coronary artery disease)    s/p DES to RCA in 06/2021   Dry eyes    Endometrial polyp    Essential hypertension 08/25/2018   GERD (gastroesophageal reflux disease)    History of hiatal hernia    History of kidney stones    Hot flashes, menopausal 10/13/2011   Estradiol started    Hyperlipidemia    Jaundice as teenager   no problems since   Memory loss 09/21/2019     1/2 of feet numb all the time   Multiple sclerosis ([place]) dx 2001   Neuropathy    bilateral feet   Osteoarthritis    Sjogren's syndrome ([place]) dx oct 2021   sore muscvles, dry mouth and eyes   Stroke ([place])    Vertigo    Vision abnormalities    Past Surgical History:  Procedure Laterality Date   ABDOMINAL HYSTERECTOMY  2002   partial   COLONOSCOPY  01/27/2022   2 day prep   colonscopy  2011   CORONARY STENT INTERVENTION N/A 07/18/2021   Procedure: CORONARY STENT INTERVENTION;  Surgeon: Jettie Booze, MD;  Location: [place] [facility];  Service: Cardiovascular;  Laterality: N/A;   DILATATION & CURETTAGE/HYSTEROSCOPY WITH MYOSURE N/A 06/08/2020   Procedure: DILATATION & CURETTAGE/HYSTEROSCOPY/Polypectomy WITH MYOSURE;  Surgeon: Armandina Stammer, DO;  Location: [place];  Service: Gynecology;  Laterality: N/A;   LEFT HEART CATH AND CORONARY ANGIOGRAPHY N/A 07/18/2021   Procedure: LEFT  HEART CATH AND CORONARY ANGIOGRAPHY;  Surgeon: Jettie Booze, MD;  Location: [place] [facility];  Service: Cardiovascular;  Laterality: N/A;   LUMBAR FUSION  2001   TOTAL HIP ARTHROPLASTY Right 01/17/2021   Procedure: TOTAL HIP ARTHROPLASTY ANTERIOR APPROACH;  Surgeon: Rod Can, MD;  Location: [facility];  Service: Orthopedics;  Laterality: Right;   UPPER GI ENDOSCOPY  yrs ago   Patient Active Problem List   Diagnosis Date Noted   Positive colorectal cancer screening using Cologuard test 11/14/2021   Coronary artery disease    Statin intolerance 04/19/2021   Osteoarthritis of right hip 01/17/2021   Status post THR (total hip replacement) 01/17/2021   Chronic bilateral low back pain without sciatica 10/02/2020   Chronic pain syndrome 06/20/2020   Post laminectomy syndrome 06/20/2020   Sjogren's disease ([place]) 05/23/2020   Primary osteoarthritis of both hands 05/23/2020   Primary osteoarthritis of both feet 05/23/2020   Other secondary scoliosis, lumbar region 12/22/2019   Spondylosis without myelopathy or radiculopathy, lumbar region 12/22/2019   Cervical radiculopathy 10/18/2019   Numbness 09/21/2019   Bilateral carpal tunnel syndrome 09/21/2019   High risk medication use 09/21/2019   Memory loss 09/21/2019   Essential hypertension 08/25/2018   Abnormal SPEP 02/19/2018  Hand pain 02/20/2017   Multiple joint pain 02/18/2017   Disturbed cognition 06/25/2016   Sciatica, right side 10/30/2015   Chronic fatigue 08/04/2014   Gait disturbance 08/04/2014   Unspecified visual disturbance 01/31/2013   Transient vision disturbance 01/31/2013   Colon polyps 11/03/2011   POSTHERPETIC NEURALGIA 10/03/2009   DISPLCMT LUMBAR INTERVERT DISC W/O MYELOPATHY 09/05/2009   RENAL CALCULUS, RECURRENT 09/04/2009   Hyperlipidemia 08/31/2009   Multiple sclerosis ([place]) 08/31/2009   ALLERGIC RHINITIS 08/31/2009    PCP: Lynford Humphrey NP  REFERRING PROVIDER: Dr Laroy Apple   REFERRING  DIAG: Low Back Pain   Rationale for Evaluation and Treatment: Rehabilitation  THERAPY DIAG:  No diagnosis found.  ONSET DATE: About 11 days ago her nerve abiliation wore off.   SUBJECTIVE:                                                                                                                                                                                           SUBJECTIVE STATEMENT: The patient reports she has been very sore over the past week. She has had increased pain standing for a period of time and walking.   PERTINENT HISTORY:  Right Hip replacement 2022, Low back Pain, MS, TIA October 2023; MI, November 2022; CAD, Sjorgens syndrome  PAIN:  Are you having pain? Yes: NPRS scale: 4/10 now 8/10 at worst  Pain location: low back pain  Pain description: aching  Aggravating factors: standing and walking  Relieving factors: rest   Yes: NPRS scale: 4/10 right now Pain location: aching  Pain description: burning and aching  Aggravating factors: standing and walking  Relieving factors: rest   PRECAUTIONS: None  WEIGHT BEARING RESTRICTIONS: No  FALLS:  Has patient fallen in last 6 months? 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   LUMBAR ROM:   AROM eval  Flexion Limited 50 % with pain    Extension No limit   Right lateral flexion  Left lateral flexion   Right rotation Limited 50% with pain   Left rotation Limited 50% with pain    (Blank rows = not tested)  LOWER EXTREMITY ROM:     Passive  Right eval Left eval  Hip flexion 105 90  Hip extension    Hip abduction    Hip adduction    Hip internal rotation painful Painful   Hip external rotation    Knee flexion    Knee extension    Ankle dorsiflexion    Ankle plantarflexion    Ankle inversion    Ankle eversion     (Blank rows = not tested)  LOWER EXTREMITY MMT:    MMT Right eval Left eval  Hip flexion 13.1 12.3  Hip extension    Hip abduction 11.5 10.1  Hip adduction    Hip internal rotation    Hip external rotation    Knee flexion    Knee extension 14.0 13.3  Ankle dorsiflexion    Ankle plantarflexion    Ankle inversion    Ankle eversion     (Blank rows = not tested)  FUNCTIONAL TESTS:  5 times sit to stand: 28 sec   GAIT:  TODAY'S TREATMENT:                                                                                                                              DATE:  1/30 Trigger Point Dry-Needling  Treatment instructions: Expect mild to moderate muscle soreness. S/S of pneumothorax if dry needled over a lung field, and to seek immediate medical attention should they occur. Patient verbalized understanding of these instructions and education.  Patient Consent Given: Yes Education handout provided: Previously provided Muscles treated: lumbar paraspinals on the right with minimal depth 2nd to previous lumbar surgery;  bilateral gluteal in 2 spots on the right all using a .30x50 needle Electrical stimulation performed: No Parameters: N/A Treatment response/outcome: great twitch   Manual: trigger point release to gluteals   LTR x20  Gluteal stretch 2x20 sec hold   Supine march 2x10  Supine hip abduction 2x15 red  Supine ball squeeze 2x10   Seated laq 2x15  Eval: Has previous  HEP  Manual: roller to bilateral hips   PATIENT EDUCATION:  Education details: reviewed HEP, symptom management  Person educated: Patient Education method: Explanation, Demonstration, Tactile cues, Verbal cues, and Handouts Education comprehension: verbalized understanding, returned demonstration, verbal cues required, tactile cues required, and needs further education  HOME EXERCISE PROGRAM: Has previous HEP. Will review next visit  ASSESSMENT:  CLINICAL IMPRESSION: Patient tolerated treatment well. She had significant trigger points in her lower back and gluteal area. She has responded well to needling in the past. We needled her today and worked on activating the muscles that we needled. We will continue to progress as symptoms allow.  OBJECTIVE IMPAIRMENTS: Abnormal gait, decreased activity tolerance, decreased balance, decreased endurance, decreased knowledge of use of DME, decreased mobility, difficulty  walking, decreased ROM, decreased strength, increased muscle spasms, and pain.   ACTIVITY LIMITATIONS: carrying, lifting, bending, standing, squatting, sleeping, stairs, transfers, and locomotion level  PARTICIPATION LIMITATIONS: meal prep, cleaning, laundry, driving, shopping, community activity, and yard work  PERSONAL FACTORS: Right Hip replacement 2022, Low back Pain, MS, TIA October 2023; MI, November 2022; CAD, Sjorgens syndrome are also affecting patient's functional outcome. MS  REHAB POTENTIAL: Good  CLINICAL DECISION MAKING: Evolving/moderate complexity declining mobility   EVALUATION COMPLEXITY: Moderate   GOALS: Goals reviewed with patient? No  SHORT TERM GOALS: Target date: 09/29/2022    Patient will increase gross bilateral lower extremity strength by 5 pounds Baseline: Goal status: INITIAL  2.  Patient will increase lumbar flexion by 10 degrees Baseline:  Goal status: INITIAL  3.  Patient will be independent with basic after exercise program and  stretching program Baseline:  Goal status: INITIAL   LONG TERM GOALS: Target date: 09/29/2022     Patient will stand for greater than 30 minutes without increased pain Baseline:  Goal status: INITIAL  2.  Patient will pick item up off the floor without increased low back pain Baseline:  Goal status: INITIAL  3.  Patient will ambulate community distances without report of increased low back pain Baseline:  Goal status: INITIAL  4.  Patient will report improved ability to sleep through the night secondary to back pain Baseline:  Goal status: INITIAL    PLAN:  PT FREQUENCY: 1-2x/week  PT DURATION: 16 weeks   PLANNED INTERVENTIONS: Therapeutic exercises, Therapeutic activity, Neuromuscular re-education, Balance training, Gait training, Patient/Family education, Self Care, Joint mobilization, Stair training, DME instructions, Aquatic Therapy, Dry Needling, Spinal mobilization, Cryotherapy, Moist heat, Ionotophoresis '4mg'$ /ml Dexamethasone, and Manual therapy.  PLAN FOR NEXT SESSION: Consider manual therapy to the back and hips, consider trigger point dry needling to back and hips.  Reviewed basic HEP.  Begin active strengthening of hips and core.  Begin gait training and functional mobility training.   Carney Living, PT 08/19/2022, 10:27 AM

## 2022-08-19 ENCOUNTER — Encounter (HOSPITAL_BASED_OUTPATIENT_CLINIC_OR_DEPARTMENT_OTHER): Payer: Self-pay | Admitting: Physical Therapy

## 2022-08-26 ENCOUNTER — Encounter (HOSPITAL_BASED_OUTPATIENT_CLINIC_OR_DEPARTMENT_OTHER): Payer: Medicare Other | Admitting: Physical Therapy

## 2022-08-26 DIAGNOSIS — M5416 Radiculopathy, lumbar region: Secondary | ICD-10-CM | POA: Diagnosis not present

## 2022-08-27 ENCOUNTER — Encounter (HOSPITAL_BASED_OUTPATIENT_CLINIC_OR_DEPARTMENT_OTHER): Payer: Self-pay | Admitting: Physical Therapy

## 2022-08-27 ENCOUNTER — Ambulatory Visit (HOSPITAL_BASED_OUTPATIENT_CLINIC_OR_DEPARTMENT_OTHER): Payer: Medicare Other | Attending: Physical Medicine and Rehabilitation | Admitting: Physical Therapy

## 2022-08-27 ENCOUNTER — Other Ambulatory Visit: Payer: Self-pay | Admitting: Physical Medicine and Rehabilitation

## 2022-08-27 DIAGNOSIS — R29898 Other symptoms and signs involving the musculoskeletal system: Secondary | ICD-10-CM

## 2022-08-27 DIAGNOSIS — R262 Difficulty in walking, not elsewhere classified: Secondary | ICD-10-CM | POA: Diagnosis not present

## 2022-08-27 DIAGNOSIS — M6281 Muscle weakness (generalized): Secondary | ICD-10-CM | POA: Diagnosis not present

## 2022-08-27 DIAGNOSIS — R252 Cramp and spasm: Secondary | ICD-10-CM | POA: Diagnosis not present

## 2022-08-27 DIAGNOSIS — M5459 Other low back pain: Secondary | ICD-10-CM | POA: Insufficient documentation

## 2022-08-27 DIAGNOSIS — M79606 Pain in leg, unspecified: Secondary | ICD-10-CM

## 2022-08-27 DIAGNOSIS — M545 Low back pain, unspecified: Secondary | ICD-10-CM

## 2022-08-27 NOTE — Therapy (Signed)
OUTPATIENT PHYSICAL THERAPY THORACOLUMBAR EVALUATION   Patient Name: Dawn [name] MRN: [id number] DOB:Feb 12, 1946, 77 y.o., female Today's Date: 08/19/2022  END OF SESSION:  PT End of Session - 08/19/22 1026     Visit Number 2    Number of Visits 16    Date for PT Re-Evaluation 11/24/22    Authorization Type progress note at 56    PT Start Time 1145    PT Stop Time 1228    PT Time Calculation (min) 43 min    Activity Tolerance Patient tolerated treatment well    Behavior During Therapy [facility] for tasks assessed/performed             Past Medical History:  Diagnosis Date   Allergy    CAD (coronary artery disease)    s/p DES to RCA in 06/2021   Dry eyes    Endometrial polyp    Essential hypertension 08/25/2018   GERD (gastroesophageal reflux disease)    History of hiatal hernia    History of kidney stones    Hot flashes, menopausal 10/13/2011   Estradiol started    Hyperlipidemia    Jaundice as teenager   no problems since   Memory loss 09/21/2019     1/2 of feet numb all the time   Multiple sclerosis ([place]) dx 2001   Neuropathy    bilateral feet   Osteoarthritis    Sjogren's syndrome ([place]) dx oct 2021   sore muscvles, dry mouth and eyes   Stroke ([place])    Vertigo    Vision abnormalities    Past Surgical History:  Procedure Laterality Date   ABDOMINAL HYSTERECTOMY  2002   partial   COLONOSCOPY  01/27/2022   2 day prep   colonscopy  2011   CORONARY STENT INTERVENTION N/A 07/18/2021   Procedure: CORONARY STENT INTERVENTION;  Surgeon: Jettie Booze, MD;  Location: [place] [facility];  Service: Cardiovascular;  Laterality: N/A;   DILATATION & CURETTAGE/HYSTEROSCOPY WITH MYOSURE N/A 06/08/2020   Procedure: DILATATION & CURETTAGE/HYSTEROSCOPY/Polypectomy WITH MYOSURE;  Surgeon: Armandina Stammer, DO;  Location: [place];  Service: Gynecology;  Laterality: N/A;   LEFT HEART CATH AND CORONARY ANGIOGRAPHY N/A 07/18/2021   Procedure: LEFT  HEART CATH AND CORONARY ANGIOGRAPHY;  Surgeon: Jettie Booze, MD;  Location: [place] [facility];  Service: Cardiovascular;  Laterality: N/A;   LUMBAR FUSION  2001   TOTAL HIP ARTHROPLASTY Right 01/17/2021   Procedure: TOTAL HIP ARTHROPLASTY ANTERIOR APPROACH;  Surgeon: Rod Can, MD;  Location: [facility];  Service: Orthopedics;  Laterality: Right;   UPPER GI ENDOSCOPY  yrs ago   Patient Active Problem List   Diagnosis Date Noted   Positive colorectal cancer screening using Cologuard test 11/14/2021   Coronary artery disease    Statin intolerance 04/19/2021   Osteoarthritis of right hip 01/17/2021   Status post THR (total hip replacement) 01/17/2021   Chronic bilateral low back pain without sciatica 10/02/2020   Chronic pain syndrome 06/20/2020   Post laminectomy syndrome 06/20/2020   Sjogren's disease ([place]) 05/23/2020   Primary osteoarthritis of both hands 05/23/2020   Primary osteoarthritis of both feet 05/23/2020   Other secondary scoliosis, lumbar region 12/22/2019   Spondylosis without myelopathy or radiculopathy, lumbar region 12/22/2019   Cervical radiculopathy 10/18/2019   Numbness 09/21/2019   Bilateral carpal tunnel syndrome 09/21/2019   High risk medication use 09/21/2019   Memory loss 09/21/2019   Essential hypertension 08/25/2018   Abnormal SPEP 02/19/2018  Hand pain 02/20/2017   Multiple joint pain 02/18/2017   Disturbed cognition 06/25/2016   Sciatica, right side 10/30/2015   Chronic fatigue 08/04/2014   Gait disturbance 08/04/2014   Unspecified visual disturbance 01/31/2013   Transient vision disturbance 01/31/2013   Colon polyps 11/03/2011   POSTHERPETIC NEURALGIA 10/03/2009   DISPLCMT LUMBAR INTERVERT DISC W/O MYELOPATHY 09/05/2009   RENAL CALCULUS, RECURRENT 09/04/2009   Hyperlipidemia 08/31/2009   Multiple sclerosis ([place]) 08/31/2009   ALLERGIC RHINITIS 08/31/2009    PCP: Lynford Humphrey NP  REFERRING PROVIDER: Dr Laroy Apple   REFERRING  DIAG: Low Back Pain   Rationale for Evaluation and Treatment: Rehabilitation  THERAPY DIAG:  No diagnosis found.  ONSET DATE: About 11 days ago her nerve abiliation wore off.   SUBJECTIVE:                                                                                                                                                                                           SUBJECTIVE STATEMENT: The patient continues to have increased pain in her hip. She is scheduled for an MRI.She feels like it is catching.    PERTINENT HISTORY:  Right Hip replacement 2022, Low back Pain, MS, TIA October 2023; MI, November 2022; CAD, Sjorgens syndrome  PAIN:  Are you having pain? Yes: NPRS scale: 4/10 now 8/10 at worst  Pain location: low back pain  Pain description: aching  Aggravating factors: standing and walking  Relieving factors: rest   Yes: NPRS scale: 4/10 right now Pain location: aching  Pain description: burning and aching  Aggravating factors: standing and walking  Relieving factors: rest   PRECAUTIONS: None  WEIGHT BEARING RESTRICTIONS: No  FALLS:  Has patient fallen in last 6 months? 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   LUMBAR ROM:   AROM eval  Flexion Limited 50 % with pain   Extension No limit    Right lateral flexion   Left lateral flexion  Right rotation Limited 50% with pain   Left rotation Limited 50% with pain    (Blank rows = not tested)  LOWER EXTREMITY ROM:     Passive  Right eval Left eval  Hip flexion 105 90  Hip extension    Hip abduction    Hip adduction    Hip internal rotation painful Painful   Hip external rotation    Knee flexion    Knee extension    Ankle dorsiflexion    Ankle plantarflexion    Ankle inversion    Ankle eversion     (Blank rows = not tested)  LOWER EXTREMITY MMT:    MMT Right eval Left eval  Hip flexion 13.1 12.3  Hip extension    Hip abduction 11.5 10.1  Hip adduction    Hip internal rotation    Hip external rotation    Knee flexion    Knee extension 14.0 13.3  Ankle dorsiflexion    Ankle plantarflexion    Ankle inversion    Ankle eversion     (Blank rows = not tested)  FUNCTIONAL TESTS:  5 times sit to stand: 28 sec   GAIT:  TODAY'S TREATMENT:                                                                                                                              DATE:  2/7 Trigger Point Dry-Needling  Treatment instructions: Expect mild to moderate muscle soreness. S/S of pneumothorax if dry needled over a lung field, and to seek immediate medical attention should they occur. Patient verbalized understanding of these instructions and education.  Patient Consent Given: Yes Education handout provided: Previously provided Muscles treated: lumbar paraspinals on the right with minimal depth 2nd to previous lumbar surgery;  bilateral gluteal in 2 spots on the right all using a .30x50 needle Electrical stimulation performed: No Parameters: N/A Treatment response/outcome: great twitch   Manual: trigger point release to gluteals; roller to lateral leg and hip   1/30 Trigger Point Dry-Needling  Treatment instructions: Expect mild to moderate muscle soreness. S/S of pneumothorax if dry needled over a lung field,  and to seek immediate medical attention should they occur. Patient verbalized understanding of these instructions and education.  Patient Consent Given: Yes Education handout provided: Previously provided Muscles treated: lumbar paraspinals on the right with minimal depth 2nd to previous lumbar surgery;  bilateral gluteal in 2 spots on the right all using a .30x50 needle Electrical stimulation performed: No Parameters: N/A Treatment response/outcome: great twitch   Manual: trigger point release to gluteals   LTR x20  Gluteal stretch 2x20 sec hold   Supine march 2x10  Supine hip abduction 2x15 red  Supine ball squeeze 2x10   Seated laq 2x15  Eval: Has previous HEP  Manual: roller to bilateral hips   PATIENT EDUCATION:  Education details: reviewed HEP, symptom management  Person educated: Patient Education method: Explanation, Demonstration, Tactile cues, Verbal cues, and  Handouts Education comprehension: verbalized understanding, returned demonstration, verbal cues required, tactile cues required, and needs further education  HOME EXERCISE PROGRAM: Has previous HEP. Will review next visit  ASSESSMENT:  CLINICAL IMPRESSION: Patient continues to have significant spasming and tightness in gluteal and lateral leg. We focused on those areas. She flet like she could walk better following treatment. We will get back into exercises more next visit.   OBJECTIVE IMPAIRMENTS: Abnormal gait, decreased activity tolerance, decreased balance, decreased endurance, decreased knowledge of use of DME, decreased mobility, difficulty walking, decreased ROM, decreased strength, increased muscle spasms, and pain.   ACTIVITY LIMITATIONS: carrying, lifting, bending, standing, squatting, sleeping, stairs, transfers, and locomotion level  PARTICIPATION LIMITATIONS: meal prep, cleaning, laundry, driving, shopping, community activity, and yard work  PERSONAL FACTORS: Right Hip replacement 2022, Low  back Pain, MS, TIA October 2023; MI, November 2022; CAD, Sjorgens syndrome are also affecting patient's functional outcome. MS  REHAB POTENTIAL: Good  CLINICAL DECISION MAKING: Evolving/moderate complexity declining mobility   EVALUATION COMPLEXITY: Moderate   GOALS: Goals reviewed with patient? No  SHORT TERM GOALS: Target date: 09/29/2022    Patient will increase gross bilateral lower extremity strength by 5 pounds Baseline: Goal status: INITIAL  2.  Patient will increase lumbar flexion by 10 degrees Baseline:  Goal status: INITIAL  3.  Patient will be independent with basic after exercise program and stretching program Baseline:  Goal status: INITIAL   LONG TERM GOALS: Target date: 09/29/2022     Patient will stand for greater than 30 minutes without increased pain Baseline:  Goal status: INITIAL  2.  Patient will pick item up off the floor without increased low back pain Baseline:  Goal status: INITIAL  3.  Patient will ambulate community distances without report of increased low back pain Baseline:  Goal status: INITIAL  4.  Patient will report improved ability to sleep through the night secondary to back pain Baseline:  Goal status: INITIAL    PLAN:  PT FREQUENCY: 1-2x/week  PT DURATION: 16 weeks   PLANNED INTERVENTIONS: Therapeutic exercises, Therapeutic activity, Neuromuscular re-education, Balance training, Gait training, Patient/Family education, Self Care, Joint mobilization, Stair training, DME instructions, Aquatic Therapy, Dry Needling, Spinal mobilization, Cryotherapy, Moist heat, Ionotophoresis '4mg'$ /ml Dexamethasone, and Manual therapy.  PLAN FOR NEXT SESSION: Consider manual therapy to the back and hips, consider trigger point dry needling to back and hips.  Reviewed basic HEP.  Begin active strengthening of hips and core.  Begin gait training and functional mobility training.   Carney Living, PT 08/19/2022, 10:27 AM

## 2022-08-30 ENCOUNTER — Ambulatory Visit
Admission: RE | Admit: 2022-08-30 | Discharge: 2022-08-30 | Disposition: A | Discharge status: Home / Self Care | Payer: Medicare Other | Source: Ambulatory Visit | Attending: Physical Medicine and Rehabilitation | Admitting: Physical Medicine and Rehabilitation

## 2022-08-30 DIAGNOSIS — M48061 Spinal stenosis, lumbar region without neurogenic claudication: Secondary | ICD-10-CM | POA: Diagnosis not present

## 2022-08-30 DIAGNOSIS — R29898 Other symptoms and signs involving the musculoskeletal system: Secondary | ICD-10-CM

## 2022-08-30 DIAGNOSIS — R200 Anesthesia of skin: Secondary | ICD-10-CM | POA: Diagnosis not present

## 2022-08-30 DIAGNOSIS — M545 Low back pain, unspecified: Secondary | ICD-10-CM | POA: Diagnosis not present

## 2022-08-30 DIAGNOSIS — M79606 Pain in leg, unspecified: Secondary | ICD-10-CM

## 2022-09-02 DIAGNOSIS — M5416 Radiculopathy, lumbar region: Secondary | ICD-10-CM | POA: Diagnosis not present

## 2022-09-03 ENCOUNTER — Ambulatory Visit (HOSPITAL_BASED_OUTPATIENT_CLINIC_OR_DEPARTMENT_OTHER): Payer: Medicare Other | Admitting: Physical Therapy

## 2022-09-03 ENCOUNTER — Other Ambulatory Visit: Payer: Self-pay | Admitting: Neurology

## 2022-09-03 DIAGNOSIS — M6281 Muscle weakness (generalized): Secondary | ICD-10-CM

## 2022-09-03 DIAGNOSIS — R262 Difficulty in walking, not elsewhere classified: Secondary | ICD-10-CM

## 2022-09-03 DIAGNOSIS — M5459 Other low back pain: Secondary | ICD-10-CM | POA: Diagnosis not present

## 2022-09-03 DIAGNOSIS — R252 Cramp and spasm: Secondary | ICD-10-CM

## 2022-09-03 NOTE — Therapy (Signed)
OUTPATIENT PHYSICAL THERAPY THORACOLUMBAR EVALUATION   Patient Name: Dawn [name] MRN: [id number] DOB:[birth year], 77 y.o., female Today's Date: 08/19/2022  END OF SESSION:  PT End of Session - 08/19/22 1026     Visit Number 2    Number of Visits 16    Date for PT Re-Evaluation 11/24/22    Authorization Type progress note at 4    PT Start Time 1145    PT Stop Time 1228    PT Time Calculation (min) 43 min    Activity Tolerance Patient tolerated treatment well    Behavior During Therapy [facility] for tasks assessed/performed             Past Medical History:  Diagnosis Date   Allergy    CAD (coronary artery disease)    s/p DES to RCA in 06/2021   Dry eyes    Endometrial polyp    Essential hypertension 08/25/2018   GERD (gastroesophageal reflux disease)    History of hiatal hernia    History of kidney stones    Hot flashes, menopausal 10/13/2011   Estradiol started    Hyperlipidemia    Jaundice as teenager   no problems since   Memory loss 09/21/2019     1/2 of feet numb all the time   Multiple sclerosis ([place]) dx 2001   Neuropathy    bilateral feet   Osteoarthritis    Sjogren's syndrome ([place]) dx oct 2021   sore muscvles, dry mouth and eyes   Stroke ([place])    Vertigo    Vision abnormalities    Past Surgical History:  Procedure Laterality Date   ABDOMINAL HYSTERECTOMY  2002   partial   COLONOSCOPY  01/27/2022   2 day prep   colonscopy  2011   CORONARY STENT INTERVENTION N/A 07/18/2021   Procedure: CORONARY STENT INTERVENTION;  Surgeon: Jettie Booze, MD;  Location: [place] [facility];  Service: Cardiovascular;  Laterality: N/A;   DILATATION & CURETTAGE/HYSTEROSCOPY WITH MYOSURE N/A 06/08/2020   Procedure: DILATATION & CURETTAGE/HYSTEROSCOPY/Polypectomy WITH MYOSURE;  Surgeon: Armandina Stammer, DO;  Location: [place];  Service: Gynecology;  Laterality: N/A;   LEFT HEART CATH AND CORONARY ANGIOGRAPHY N/A 07/18/2021   Procedure: LEFT  HEART CATH AND CORONARY ANGIOGRAPHY;  Surgeon: Jettie Booze, MD;  Location: [place] [facility];  Service: Cardiovascular;  Laterality: N/A;   LUMBAR FUSION  2001   TOTAL HIP ARTHROPLASTY Right 01/17/2021   Procedure: TOTAL HIP ARTHROPLASTY ANTERIOR APPROACH;  Surgeon: Rod Can, MD;  [facility];  Service: Orthopedics;  Laterality: Right;   UPPER GI ENDOSCOPY  yrs ago   Patient Active Problem List   Diagnosis Date Noted   Positive colorectal cancer screening using Cologuard test 11/14/2021   Coronary artery disease    Statin intolerance 04/19/2021   Osteoarthritis of right hip 01/17/2021   Status post THR (total hip replacement) 01/17/2021   Chronic bilateral low back pain without sciatica 10/02/2020   Chronic pain syndrome 06/20/2020   Post laminectomy syndrome 06/20/2020   Sjogren's disease ([place]) 05/23/2020   Primary osteoarthritis of both hands 05/23/2020   Primary osteoarthritis of both feet 05/23/2020   Other secondary scoliosis, lumbar region 12/22/2019   Spondylosis without myelopathy or radiculopathy, lumbar region 12/22/2019   Cervical radiculopathy 10/18/2019   Numbness 09/21/2019   Bilateral carpal tunnel syndrome 09/21/2019   High risk medication use 09/21/2019   Memory loss 09/21/2019   Essential hypertension 08/25/2018   Abnormal SPEP 02/19/2018  Hand pain 02/20/2017   Multiple joint pain 02/18/2017   Disturbed cognition 06/25/2016   Sciatica, right side 10/30/2015   Chronic fatigue 08/04/2014   Gait disturbance 08/04/2014   Unspecified visual disturbance 01/31/2013   Transient vision disturbance 01/31/2013   Colon polyps 11/03/2011   POSTHERPETIC NEURALGIA 10/03/2009   DISPLCMT LUMBAR INTERVERT DISC W/O MYELOPATHY 09/05/2009   RENAL CALCULUS, RECURRENT 09/04/2009   Hyperlipidemia 08/31/2009   Multiple sclerosis ([place]) 08/31/2009   ALLERGIC RHINITIS 08/31/2009    PCP: Lynford Humphrey NP  REFERRING PROVIDER: Dr Laroy Apple   REFERRING  DIAG: Low Back Pain   Rationale for Evaluation and Treatment: Rehabilitation  THERAPY DIAG:  No diagnosis found.  ONSET DATE: About 11 days ago her nerve abiliation wore off.   SUBJECTIVE:                                                                                                                                                                                           SUBJECTIVE STATEMENT: The patient continues to have increased pain in her hip. She is scheduled for an MRI.She feels like it is catching.    PERTINENT HISTORY:  Right Hip replacement 2022, Low back Pain, MS, TIA October 2023; MI, November 2022; CAD, Sjorgens syndrome  PAIN:  Are you having pain? Yes: NPRS scale: 4/10 now 8/10 at worst  Pain location: low back pain  Pain description: aching  Aggravating factors: standing and walking  Relieving factors: rest   Yes: NPRS scale: 4/10 right now Pain location: aching  Pain description: burning and aching  Aggravating factors: standing and walking  Relieving factors: rest   PRECAUTIONS: None  WEIGHT BEARING RESTRICTIONS: No  FALLS:  Has patient fallen in last 6 months? 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   LUMBAR ROM:   AROM eval  Flexion Limited 50 % with pain   Extension No limit    Right lateral flexion   Left lateral flexion  Right rotation Limited 50% with pain   Left rotation Limited 50% with pain    (Blank rows = not tested)  LOWER EXTREMITY ROM:     Passive  Right eval Left eval  Hip flexion 105 90  Hip extension    Hip abduction    Hip adduction    Hip internal rotation painful Painful   Hip external rotation    Knee flexion    Knee extension    Ankle dorsiflexion    Ankle plantarflexion    Ankle inversion    Ankle eversion     (Blank rows = not tested)  LOWER EXTREMITY MMT:    MMT Right eval Left eval  Hip flexion 13.1 12.3  Hip extension    Hip abduction 11.5 10.1  Hip adduction    Hip internal rotation    Hip external rotation    Knee flexion    Knee extension 14.0 13.3  Ankle dorsiflexion    Ankle plantarflexion    Ankle inversion    Ankle eversion     (Blank rows = not tested)  FUNCTIONAL TESTS:  5 times sit to stand: 28 sec   GAIT:  TODAY'S TREATMENT:                                                                                                                              DATE:  2/14 Trigger Point Dry-Needling  Treatment instructions: Expect mild to moderate muscle soreness. S/S of pneumothorax if dry needled over a lung field, and to seek immediate medical attention should they occur. Patient verbalized understanding of these instructions and education.  Patient Consent Given: Yes Education handout provided: Previously provided Muscles treated: lumbar paraspinals on the right with minimal depth 2nd to previous lumbar surgery;  bilateral gluteal in 3 spots on the right  and left all using a .30x50 needle Electrical stimulation performed: No Parameters: N/A Treatment response/outcome: great twitch    2/7 Trigger Point Dry-Needling  Treatment instructions: Expect mild to moderate muscle soreness. S/S of pneumothorax if dry needled over a lung field, and to seek immediate medical attention should they occur.  Patient verbalized understanding of these instructions and education.  Patient Consent Given: Yes Education handout provided: Previously provided Muscles treated: lumbar paraspinals on the right with minimal depth 2nd to previous lumbar surgery;  bilateral gluteal in 2 spots on the right all using a .30x50 needle Electrical stimulation performed: No Parameters: N/A Treatment response/outcome: great twitch   Manual: trigger point release to gluteals; roller to lateral leg and hip    LTR x20  Gluteal stretch 2x20 sec hold   Supine march 2x10  Supine hip abduction 2x15 red  Supine ball squeeze 2x10  SAQ 2x15   Seated laq 2x15  Standing heel raise 2x15  Standing march 2x15    1/30 Trigger Point Dry-Needling  Treatment instructions: Expect mild to moderate muscle soreness. S/S of pneumothorax if dry needled over a lung field,  and to seek immediate medical attention should they occur. Patient verbalized understanding of these instructions and education.  Patient Consent Given: Yes Education handout provided: Previously provided Muscles treated: lumbar paraspinals on the right with minimal depth 2nd to previous lumbar surgery;  bilateral gluteal in 2 spots on the right all using a .30x50 needle Electrical stimulation performed: No Parameters: N/A Treatment response/outcome: great twitch   Manual: trigger point release to gluteals   LTR x20  Gluteal stretch 2x20 sec hold   Supine march 2x10  Supine hip abduction 2x15 red  Supine ball squeeze 2x10   Seated laq 2x15  Eval: Has previous HEP  Manual: roller to bilateral hips   PATIENT EDUCATION:  Education details: reviewed HEP, symptom management  Person educated: Patient Education method: Explanation, Demonstration, Tactile cues, Verbal cues, and Handouts Education comprehension: verbalized understanding, returned demonstration, verbal cues required, tactile cues required, and needs further education  HOME EXERCISE  PROGRAM: Has previous HEP. Will review next visit  ASSESSMENT:  CLINICAL IMPRESSION: The patient was able to complete more exercises today then previous visits. She continues to have pain in her gluteals and in her lower back. She had a great twitch response. We will continue to progress as tolrated.  OBJECTIVE IMPAIRMENTS: Abnormal gait, decreased activity tolerance, decreased balance, decreased endurance, decreased knowledge of use of DME, decreased mobility, difficulty walking, decreased ROM, decreased strength, increased muscle spasms, and pain.   ACTIVITY LIMITATIONS: carrying, lifting, bending, standing, squatting, sleeping, stairs, transfers, and locomotion level  PARTICIPATION LIMITATIONS: meal prep, cleaning, laundry, driving, shopping, community activity, and yard work  PERSONAL FACTORS: Right Hip replacement 2022, Low back Pain, MS, TIA October 2023; MI, November 2022; CAD, Sjorgens syndrome are also affecting patient's functional outcome. MS  REHAB POTENTIAL: Good  CLINICAL DECISION MAKING: Evolving/moderate complexity declining mobility   EVALUATION COMPLEXITY: Moderate   GOALS: Goals reviewed with patient? No  SHORT TERM GOALS: Target date: 09/29/2022    Patient will increase gross bilateral lower extremity strength by 5 pounds Baseline: Goal status: INITIAL  2.  Patient will increase lumbar flexion by 10 degrees Baseline:  Goal status: INITIAL  3.  Patient will be independent with basic after exercise program and stretching program Baseline:  Goal status: INITIAL   LONG TERM GOALS: Target date: 09/29/2022     Patient will stand for greater than 30 minutes without increased pain Baseline:  Goal status: INITIAL  2.  Patient will pick item up off the floor without increased low back pain Baseline:  Goal status: INITIAL  3.  Patient will ambulate community distances without report of increased low back pain Baseline:  Goal status: INITIAL  4.  Patient  will report improved ability to sleep through the night secondary to back pain Baseline:  Goal status: INITIAL    PLAN:  PT FREQUENCY: 1-2x/week  PT DURATION: 16 weeks   PLANNED INTERVENTIONS: Therapeutic exercises, Therapeutic activity, Neuromuscular re-education, Balance training, Gait training, Patient/Family education, Self Care, Joint mobilization, Stair training, DME instructions, Aquatic Therapy, Dry Needling, Spinal mobilization, Cryotherapy, Moist heat, Ionotophoresis 43m/ml Dexamethasone, and Manual therapy.  PLAN FOR NEXT SESSION: Consider manual therapy to the back and hips, consider trigger point dry needling to back and hips.  Reviewed basic HEP.  Begin active strengthening of hips and core.  Begin gait training and functional mobility training.   DCarney Living PT 08/19/2022, 10:27 AM

## 2022-09-05 DIAGNOSIS — M7061 Trochanteric bursitis, right hip: Secondary | ICD-10-CM | POA: Diagnosis not present

## 2022-09-05 DIAGNOSIS — Z471 Aftercare following joint replacement surgery: Secondary | ICD-10-CM | POA: Diagnosis not present

## 2022-09-05 DIAGNOSIS — Z96641 Presence of right artificial hip joint: Secondary | ICD-10-CM | POA: Diagnosis not present

## 2022-09-10 ENCOUNTER — Encounter (HOSPITAL_BASED_OUTPATIENT_CLINIC_OR_DEPARTMENT_OTHER): Payer: Self-pay | Admitting: Physical Therapy

## 2022-09-10 ENCOUNTER — Ambulatory Visit (HOSPITAL_BASED_OUTPATIENT_CLINIC_OR_DEPARTMENT_OTHER): Payer: Medicare Other | Admitting: Physical Therapy

## 2022-09-10 DIAGNOSIS — M6281 Muscle weakness (generalized): Secondary | ICD-10-CM

## 2022-09-10 DIAGNOSIS — R262 Difficulty in walking, not elsewhere classified: Secondary | ICD-10-CM | POA: Diagnosis not present

## 2022-09-10 DIAGNOSIS — M5459 Other low back pain: Secondary | ICD-10-CM

## 2022-09-10 DIAGNOSIS — R252 Cramp and spasm: Secondary | ICD-10-CM | POA: Diagnosis not present

## 2022-09-10 NOTE — Therapy (Signed)
OUTPATIENT PHYSICAL THERAPY THORACOLUMBAR EVALUATION   Patient Name: Dawn [name] MRN: [id number] DOB:Feb 12, 1946, 77 y.o., female Today's Date: 08/19/2022  END OF SESSION:  PT End of Session - 08/19/22 1026     Visit Number 2    Number of Visits 16    Date for PT Re-Evaluation 11/24/22    Authorization Type progress note at 56    PT Start Time 1145    PT Stop Time 1228    PT Time Calculation (min) 43 min    Activity Tolerance Patient tolerated treatment well    Behavior During Therapy [facility] for tasks assessed/performed             Past Medical History:  Diagnosis Date   Allergy    CAD (coronary artery disease)    s/p DES to RCA in 06/2021   Dry eyes    Endometrial polyp    Essential hypertension 08/25/2018   GERD (gastroesophageal reflux disease)    History of hiatal hernia    History of kidney stones    Hot flashes, menopausal 10/13/2011   Estradiol started    Hyperlipidemia    Jaundice as teenager   no problems since   Memory loss 09/21/2019     1/2 of feet numb all the time   Multiple sclerosis ([place]) dx 2001   Neuropathy    bilateral feet   Osteoarthritis    Sjogren's syndrome ([place]) dx oct 2021   sore muscvles, dry mouth and eyes   Stroke ([place])    Vertigo    Vision abnormalities    Past Surgical History:  Procedure Laterality Date   ABDOMINAL HYSTERECTOMY  2002   partial   COLONOSCOPY  01/27/2022   2 day prep   colonscopy  2011   CORONARY STENT INTERVENTION N/A 07/18/2021   Procedure: CORONARY STENT INTERVENTION;  Surgeon: Jettie Booze, MD;  Location: [place] [facility];  Service: Cardiovascular;  Laterality: N/A;   DILATATION & CURETTAGE/HYSTEROSCOPY WITH MYOSURE N/A 06/08/2020   Procedure: DILATATION & CURETTAGE/HYSTEROSCOPY/Polypectomy WITH MYOSURE;  Surgeon: Armandina Stammer, DO;  Location: [place];  Service: Gynecology;  Laterality: N/A;   LEFT HEART CATH AND CORONARY ANGIOGRAPHY N/A 07/18/2021   Procedure: LEFT  HEART CATH AND CORONARY ANGIOGRAPHY;  Surgeon: Jettie Booze, MD;  Location: [place] [facility];  Service: Cardiovascular;  Laterality: N/A;   LUMBAR FUSION  2001   TOTAL HIP ARTHROPLASTY Right 01/17/2021   Procedure: TOTAL HIP ARTHROPLASTY ANTERIOR APPROACH;  Surgeon: Rod Can, MD;  Location: [facility];  Service: Orthopedics;  Laterality: Right;   UPPER GI ENDOSCOPY  yrs ago   Patient Active Problem List   Diagnosis Date Noted   Positive colorectal cancer screening using Cologuard test 11/14/2021   Coronary artery disease    Statin intolerance 04/19/2021   Osteoarthritis of right hip 01/17/2021   Status post THR (total hip replacement) 01/17/2021   Chronic bilateral low back pain without sciatica 10/02/2020   Chronic pain syndrome 06/20/2020   Post laminectomy syndrome 06/20/2020   Sjogren's disease ([place]) 05/23/2020   Primary osteoarthritis of both hands 05/23/2020   Primary osteoarthritis of both feet 05/23/2020   Other secondary scoliosis, lumbar region 12/22/2019   Spondylosis without myelopathy or radiculopathy, lumbar region 12/22/2019   Cervical radiculopathy 10/18/2019   Numbness 09/21/2019   Bilateral carpal tunnel syndrome 09/21/2019   High risk medication use 09/21/2019   Memory loss 09/21/2019   Essential hypertension 08/25/2018   Abnormal SPEP 02/19/2018  Hand pain 02/20/2017   Multiple joint pain 02/18/2017   Disturbed cognition 06/25/2016   Sciatica, right side 10/30/2015   Chronic fatigue 08/04/2014   Gait disturbance 08/04/2014   Unspecified visual disturbance 01/31/2013   Transient vision disturbance 01/31/2013   Colon polyps 11/03/2011   POSTHERPETIC NEURALGIA 10/03/2009   DISPLCMT LUMBAR INTERVERT DISC W/O MYELOPATHY 09/05/2009   RENAL CALCULUS, RECURRENT 09/04/2009   Hyperlipidemia 08/31/2009   Multiple sclerosis ([place]) 08/31/2009   ALLERGIC RHINITIS 08/31/2009    PCP: Lynford Humphrey NP  REFERRING PROVIDER: Dr Laroy Apple   REFERRING  DIAG: Low Back Pain   Rationale for Evaluation and Treatment: Rehabilitation  THERAPY DIAG:  No diagnosis found.  ONSET DATE: About 11 days ago her nerve abiliation wore off.   SUBJECTIVE:                                                                                                                                                                                           SUBJECTIVE STATEMENT: The patient was doing well but she did a lot yesterday and is very sore in the left hip today.   PERTINENT HISTORY:  Right Hip replacement 2022, Low back Pain, MS, TIA October 2023; MI, November 2022; CAD, Sjorgens syndrome  PAIN:  Are you having pain? Yes: NPRS scale: 6/10 Pain location: Left hip  Pain description: aching  Aggravating factors: standing and walking  Relieving factors: rest   Yes: NPRS scale: 4/10 right now Pain location: aching  Pain description: burning and aching  Aggravating factors: standing and walking  Relieving factors: rest   PRECAUTIONS: None  WEIGHT BEARING RESTRICTIONS: No  FALLS:  Has patient fallen in last 6 months? 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   LUMBAR ROM:   AROM eval  Flexion Limited 50 % with pain   Extension No limit   Right lateral flexion   Left lateral  flexion   Right rotation Limited 50% with pain  Left rotation Limited 50% with pain    (Blank rows = not tested)  LOWER EXTREMITY ROM:     Passive  Right eval Left eval  Hip flexion 105 90  Hip extension    Hip abduction    Hip adduction    Hip internal rotation painful Painful   Hip external rotation    Knee flexion    Knee extension    Ankle dorsiflexion    Ankle plantarflexion    Ankle inversion    Ankle eversion     (Blank rows = not tested)  LOWER EXTREMITY MMT:    MMT Right eval Left eval  Hip flexion 13.1 12.3  Hip extension    Hip abduction 11.5 10.1  Hip adduction    Hip internal rotation    Hip external rotation    Knee flexion    Knee extension 14.0 13.3  Ankle dorsiflexion    Ankle plantarflexion    Ankle inversion    Ankle eversion     (Blank rows = not tested)  FUNCTIONAL TESTS:  5 times sit to stand: 28 sec   GAIT:  TODAY'S TREATMENT:                                                                                                                              DATE:  2/21:   Manual: bilateral roller to both hips; trigger point release to lower lumbar spine; Trigger point release to both hips   Supine march yellow 2x20  Supine hip abduction 2x20 yellow    2/14 Trigger Point Dry-Needling  Treatment instructions: Expect mild to moderate muscle soreness. S/S of pneumothorax if dry needled over a lung field, and to seek immediate medical attention should they occur. Patient verbalized understanding of these instructions and education.  Patient Consent Given: Yes Education handout provided: Previously provided Muscles treated: lumbar paraspinals on the right with minimal depth 2nd to previous lumbar surgery;  bilateral gluteal in 3 spots on the right  and left all using a .30x50 needle Electrical stimulation performed: No Parameters: N/A Treatment response/outcome: great twitch    2/7 Trigger Point Dry-Needling  Treatment instructions:  Expect mild to moderate muscle soreness. S/S of pneumothorax if dry needled over a lung field, and to seek immediate medical attention should they occur. Patient verbalized understanding of these instructions and education.  Patient Consent Given: Yes Education handout provided: Previously provided Muscles treated: lumbar paraspinals on the right with minimal depth 2nd to previous lumbar surgery;  bilateral gluteal in 2 spots on the right all using a .30x50 needle Electrical stimulation performed: No Parameters: N/A Treatment response/outcome: great twitch   Manual: trigger point release to gluteals; roller to lateral leg and hip    LTR x20  Gluteal stretch 2x20 sec hold   Supine march 2x10  Supine hip abduction 2x15 red  Supine ball squeeze 2x10  SAQ 2x15   Seated laq 2x15  Standing heel raise 2x15  Standing march 2x15     Eval: Has previous HEP  Manual: roller to bilateral hips   PATIENT EDUCATION:  Education details: reviewed HEP, symptom management  Person educated: Patient Education method: Explanation, Demonstration, Tactile cues, Verbal cues, and Handouts Education comprehension: verbalized understanding, returned demonstration, verbal cues required, tactile cues required, and needs further education  HOME EXERCISE PROGRAM: Has previous HEP. Will review next visit  ASSESSMENT:  CLINICAL IMPRESSION:  The patient could feel left hip and left foot release form spasm during manual therapy. We performed light exercises after. Overall she feels like she is getting a few days of relief following therapy. She was advised to continue light exercises. We worked a few exercises to get her glut and anterior hip working a bit today. She was also encouraged to continue walking as much as she is able.    OBJECTIVE IMPAIRMENTS: Abnormal gait, decreased activity tolerance, decreased balance, decreased endurance, decreased knowledge of use of DME, decreased mobility, difficulty  walking, decreased ROM, decreased strength, increased muscle spasms, and pain.   ACTIVITY LIMITATIONS: carrying, lifting, bending, standing, squatting, sleeping, stairs, transfers, and locomotion level  PARTICIPATION LIMITATIONS: meal prep, cleaning, laundry, driving, shopping, community activity, and yard work  PERSONAL FACTORS: Right Hip replacement 2022, Low back Pain, MS, TIA October 2023; MI, November 2022; CAD, Sjorgens syndrome are also affecting patient's functional outcome. MS  REHAB POTENTIAL: Good  CLINICAL DECISION MAKING: Evolving/moderate complexity declining mobility   EVALUATION COMPLEXITY: Moderate   GOALS: Goals reviewed with patient? No  SHORT TERM GOALS: Target date: 09/29/2022    Patient will increase gross bilateral lower extremity strength by 5 pounds Baseline: Goal status: INITIAL  2.  Patient will increase lumbar flexion by 10 degrees Baseline:  Goal status: INITIAL  3.  Patient will be independent with basic after exercise program and stretching program Baseline:  Goal status: INITIAL   LONG TERM GOALS: Target date: 09/29/2022     Patient will stand for greater than 30 minutes without increased pain Baseline:  Goal status: INITIAL  2.  Patient will pick item up off the floor without increased low back pain Baseline:  Goal status: INITIAL  3.  Patient will ambulate community distances without report of increased low back pain Baseline:  Goal status: INITIAL  4.  Patient will report improved ability to sleep through the night secondary to back pain Baseline:  Goal status: INITIAL    PLAN:  PT FREQUENCY: 1-2x/week  PT DURATION: 16 weeks   PLANNED INTERVENTIONS: Therapeutic exercises, Therapeutic activity, Neuromuscular re-education, Balance training, Gait training, Patient/Family education, Self Care, Joint mobilization, Stair training, DME instructions, Aquatic Therapy, Dry Needling, Spinal mobilization, Cryotherapy, Moist heat,  Ionotophoresis 67m/ml Dexamethasone, and Manual therapy.  PLAN FOR NEXT SESSION: Consider manual therapy to the back and hips, consider trigger point dry needling to back and hips.  Reviewed basic HEP.  Begin active strengthening of hips and core.  Begin gait training and functional mobility training.   DCarney Living PT 08/19/2022, 10:27 AM

## 2022-09-15 ENCOUNTER — Telehealth: Payer: Self-pay | Admitting: Cardiovascular Disease

## 2022-09-15 NOTE — Telephone Encounter (Signed)
Patient is requesting to switch providers from Dr. Harl Bowie to Dr. Oval Linsey.  Please advise, Thank you both

## 2022-09-17 ENCOUNTER — Encounter (HOSPITAL_BASED_OUTPATIENT_CLINIC_OR_DEPARTMENT_OTHER): Payer: Self-pay | Admitting: Physical Therapy

## 2022-09-17 ENCOUNTER — Ambulatory Visit (HOSPITAL_BASED_OUTPATIENT_CLINIC_OR_DEPARTMENT_OTHER): Payer: Medicare Other | Admitting: Physical Therapy

## 2022-09-17 DIAGNOSIS — M6281 Muscle weakness (generalized): Secondary | ICD-10-CM

## 2022-09-17 DIAGNOSIS — R262 Difficulty in walking, not elsewhere classified: Secondary | ICD-10-CM

## 2022-09-17 DIAGNOSIS — M5459 Other low back pain: Secondary | ICD-10-CM

## 2022-09-17 DIAGNOSIS — R252 Cramp and spasm: Secondary | ICD-10-CM

## 2022-09-17 NOTE — Therapy (Signed)
OUTPATIENT PHYSICAL THERAPY THORACOLUMBAR EVALUATION   Patient Name: Dawn [name] MRN: [id number] DOB:10/18/1945, 77 y.o., female Today's Date: 09/17/2022  END OF SESSION:  PT End of Session - 09/17/22 1023     Visit Number 6    Number of Visits 16    Date for PT Re-Evaluation 11/24/22    Authorization Type progress note at 75    PT Start Time [id number]    PT Stop Time 1058    PT Time Calculation (min) 43 min    Activity Tolerance Patient tolerated treatment well    Behavior During Therapy [facility] for tasks assessed/performed             Past Medical History:  Diagnosis Date   Allergy    CAD (coronary artery disease)    s/p DES to RCA in 06/2021   Dry eyes    Endometrial polyp    Essential hypertension 08/25/2018   GERD (gastroesophageal reflux disease)    History of hiatal hernia    History of kidney stones    Hot flashes, menopausal 10/13/2011   Estradiol started    Hyperlipidemia    Jaundice as teenager   no problems since   Memory loss 09/21/2019     1/2 of feet numb all the time   Multiple sclerosis ([place]) dx 2001   Neuropathy    bilateral feet   Osteoarthritis    Sjogren's syndrome ([place]) dx oct 2021   sore muscvles, dry mouth and eyes   Stroke ([place])    Vertigo    Vision abnormalities    Past Surgical History:  Procedure Laterality Date   ABDOMINAL HYSTERECTOMY  2002   partial   COLONOSCOPY  01/27/2022   2 day prep   colonscopy  2011   CORONARY STENT INTERVENTION N/A 07/18/2021   Procedure: CORONARY STENT INTERVENTION;  Surgeon: Jettie Booze, MD;  Location: [place] [facility];  Service: Cardiovascular;  Laterality: N/A;   DILATATION & CURETTAGE/HYSTEROSCOPY WITH MYOSURE N/A 06/08/2020   Procedure: DILATATION & CURETTAGE/HYSTEROSCOPY/Polypectomy WITH MYOSURE;  Surgeon: Armandina Stammer, DO;  Location: [place];  Service: Gynecology;  Laterality: N/A;   LEFT HEART CATH AND CORONARY ANGIOGRAPHY N/A 07/18/2021   Procedure: LEFT  HEART CATH AND CORONARY ANGIOGRAPHY;  Surgeon: Jettie Booze, MD;  Location: [place] [facility];  Service: Cardiovascular;  Laterality: N/A;   LUMBAR FUSION  2001   TOTAL HIP ARTHROPLASTY Right 01/17/2021   Procedure: TOTAL HIP ARTHROPLASTY ANTERIOR APPROACH;  Surgeon: Rod Can, MD;  Location: [facility];  Service: Orthopedics;  Laterality: Right;   UPPER GI ENDOSCOPY  yrs ago   Patient Active Problem List   Diagnosis Date Noted   Positive colorectal cancer screening using Cologuard test 11/14/2021   Coronary artery disease    Statin intolerance 04/19/2021   Osteoarthritis of right hip 01/17/2021   Status post THR (total hip replacement) 01/17/2021   Chronic bilateral low back pain without sciatica 10/02/2020   Chronic pain syndrome 06/20/2020   Post laminectomy syndrome 06/20/2020   Sjogren's disease ([place]) 05/23/2020   Primary osteoarthritis of both hands 05/23/2020   Primary osteoarthritis of both feet 05/23/2020   Other secondary scoliosis, lumbar region 12/22/2019   Spondylosis without myelopathy or radiculopathy, lumbar region 12/22/2019   Cervical radiculopathy 10/18/2019   Numbness 09/21/2019   Bilateral carpal tunnel syndrome 09/21/2019   High risk medication use 09/21/2019   Memory loss 09/21/2019   Essential hypertension 08/25/2018   Abnormal SPEP 02/19/2018  Hand pain 02/20/2017   Multiple joint pain 02/18/2017   Disturbed cognition 06/25/2016   Sciatica, right side 10/30/2015   Chronic fatigue 08/04/2014   Gait disturbance 08/04/2014   Unspecified visual disturbance 01/31/2013   Transient vision disturbance 01/31/2013   Colon polyps 11/03/2011   POSTHERPETIC NEURALGIA 10/03/2009   DISPLCMT LUMBAR INTERVERT DISC W/O MYELOPATHY 09/05/2009   RENAL CALCULUS, RECURRENT 09/04/2009   Hyperlipidemia 08/31/2009   Multiple sclerosis ([place]) 08/31/2009   ALLERGIC RHINITIS 08/31/2009    PCP: Lynford Humphrey NP  REFERRING PROVIDER: Dr Laroy Apple   REFERRING  DIAG: Low Back Pain   Rationale for Evaluation and Treatment: Rehabilitation  THERAPY DIAG:  Difficulty in walking, not elsewhere classified  Cramp and spasm  Other low back pain  Muscle weakness (generalized)  ONSET DATE: About 11 days ago her nerve abiliation wore off.   SUBJECTIVE:                                                                                                                                                                                           SUBJECTIVE STATEMENT: The patient was doing well but she did a lot yesterday and is very sore in the left hip today.   PERTINENT HISTORY:  Right Hip replacement 2022, Low back Pain, MS, TIA October 2023; MI, November 2022; CAD, Sjorgens syndrome  PAIN:  Are you having pain? Yes: NPRS scale: 6/10 Pain location: Left hip  Pain description: aching  Aggravating factors: standing and walking  Relieving factors: rest   Yes: NPRS scale: 4/10 right now Pain location: aching  Pain description: burning and aching  Aggravating factors: standing and walking  Relieving factors: rest   PRECAUTIONS: None  WEIGHT BEARING RESTRICTIONS: No  FALLS:  Has patient fallen in last 6 months? 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   LUMBAR ROM:   AROM  eval  Flexion Limited 50 % with pain   Extension No limit  Right lateral flexion   Left lateral flexion   Right rotation Limited 50% with pain   Left rotation Limited 50% with pain    (Blank rows = not tested)  LOWER EXTREMITY ROM:     Passive  Right eval Left eval  Hip flexion 105 90  Hip extension    Hip abduction    Hip adduction    Hip internal rotation painful Painful   Hip external rotation    Knee flexion    Knee extension    Ankle dorsiflexion    Ankle plantarflexion    Ankle inversion    Ankle eversion     (Blank rows = not tested)  LOWER EXTREMITY MMT:    MMT Right eval Left eval  Hip flexion 13.1 12.3  Hip extension    Hip abduction 11.5 10.1  Hip adduction    Hip internal rotation    Hip external rotation    Knee flexion    Knee extension 14.0 13.3  Ankle dorsiflexion    Ankle plantarflexion    Ankle inversion    Ankle eversion     (Blank rows = not tested)  FUNCTIONAL TESTS:  5 times sit to stand: 28 sec   GAIT:  TODAY'S TREATMENT:                                                                                                                              DATE:  2/28  Manual: bilateral roller to both hips; trigger point release to lower lumbar spine; Trigger point release to both hips   Supine march red 2x20  Supine hip abduction 2x20 red [place] 2x10  SAQ 2x15 bilateral  Standing march 2x15  Standing heel raise 2x15    2/21:   Manual: bilateral roller to both hips; trigger point release to lower lumbar spine; Trigger point release to both hips   Supine march yellow 2x20  Supine hip abduction 2x20 yellow    2/14 Trigger Point Dry-Needling  Treatment instructions: Expect mild to moderate muscle soreness. S/S of pneumothorax if dry needled over a lung field, and to seek immediate medical attention should they occur. Patient verbalized understanding of these instructions and education.  Patient Consent Given: Yes Education  handout provided: Previously provided Muscles treated: lumbar paraspinals on the right with minimal depth 2nd to previous lumbar surgery;  bilateral gluteal in 3 spots on the right  and left all using a .30x50 needle Electrical stimulation performed: No Parameters: N/A Treatment response/outcome: great twitch    2/7 Trigger Point Dry-Needling  Treatment instructions: Expect mild to moderate muscle soreness. S/S of pneumothorax if dry needled over a lung field, and to seek immediate medical attention should they occur. Patient verbalized understanding of these instructions and education.  Patient Consent Given: Yes Education handout provided: Previously provided Muscles treated: lumbar paraspinals on the right with minimal depth 2nd to previous lumbar surgery;  bilateral gluteal in 2 spots on the right all  using a .30x50 needle Electrical stimulation performed: No Parameters: N/A Treatment response/outcome: great twitch   Manual: trigger point release to gluteals; roller to lateral leg and hip    LTR x20  Gluteal stretch 2x20 sec hold   Supine march 2x10  Supine hip abduction 2x15 red  Supine ball squeeze 2x10  SAQ 2x15   Seated laq 2x15  Standing heel raise 2x15  Standing march 2x15     Eval: Has previous HEP  Manual: roller to bilateral hips   PATIENT EDUCATION:  Education details: reviewed HEP, symptom management  Person educated: Patient Education method: Explanation, Demonstration, Tactile cues, Verbal cues, and Handouts Education comprehension: verbalized understanding, returned demonstration, verbal cues required, tactile cues required, and needs further education  HOME EXERCISE PROGRAM: Has previous HEP. Will review next visit  ASSESSMENT:  CLINICAL IMPRESSION:  The patient is making some progress. She looked more upright and stood better when entrering the clinic today. She reports overall she is having days that are good. She is having less days with  severe pain but the severe pain still comes and goes. We continue to work on advancing standing exercises. We worked on standing marching today.   OBJECTIVE IMPAIRMENTS: Abnormal gait, decreased activity tolerance, decreased balance, decreased endurance, decreased knowledge of use of DME, decreased mobility, difficulty walking, decreased ROM, decreased strength, increased muscle spasms, and pain.   ACTIVITY LIMITATIONS: carrying, lifting, bending, standing, squatting, sleeping, stairs, transfers, and locomotion level  PARTICIPATION LIMITATIONS: meal prep, cleaning, laundry, driving, shopping, community activity, and yard work  PERSONAL FACTORS: Right Hip replacement 2022, Low back Pain, MS, TIA October 2023; MI, November 2022; CAD, Sjorgens syndrome are also affecting patient's functional outcome. MS  REHAB POTENTIAL: Good  CLINICAL DECISION MAKING: Evolving/moderate complexity declining mobility   EVALUATION COMPLEXITY: Moderate   GOALS: Goals reviewed with patient? No  SHORT TERM GOALS: Target date: 09/29/2022    Patient will increase gross bilateral lower extremity strength by 5 pounds Baseline: Goal status: INITIAL  2.  Patient will increase lumbar flexion by 10 degrees Baseline:  Goal status: INITIAL  3.  Patient will be independent with basic after exercise program and stretching program Baseline:  Goal status: INITIAL   LONG TERM GOALS: Target date: 09/29/2022     Patient will stand for greater than 30 minutes without increased pain Baseline:  Goal status: INITIAL  2.  Patient will pick item up off the floor without increased low back pain Baseline:  Goal status: INITIAL  3.  Patient will ambulate community distances without report of increased low back pain Baseline:  Goal status: INITIAL  4.  Patient will report improved ability to sleep through the night secondary to back pain Baseline:  Goal status: INITIAL    PLAN:  PT FREQUENCY: 1-2x/week  PT  DURATION: 16 weeks   PLANNED INTERVENTIONS: Therapeutic exercises, Therapeutic activity, Neuromuscular re-education, Balance training, Gait training, Patient/Family education, Self Care, Joint mobilization, Stair training, DME instructions, Aquatic Therapy, Dry Needling, Spinal mobilization, Cryotherapy, Moist heat, Ionotophoresis '4mg'$ /ml Dexamethasone, and Manual therapy.  PLAN FOR NEXT SESSION: Consider manual therapy to the back and hips, consider trigger point dry needling to back and hips.  Reviewed basic HEP.  Begin active strengthening of hips and core.  Begin gait training and functional mobility training.   Carney Living, PT 09/17/2022, 10:48 AM

## 2022-09-23 DIAGNOSIS — M7061 Trochanteric bursitis, right hip: Secondary | ICD-10-CM | POA: Diagnosis not present

## 2022-09-23 DIAGNOSIS — M533 Sacrococcygeal disorders, not elsewhere classified: Secondary | ICD-10-CM | POA: Diagnosis not present

## 2022-09-24 ENCOUNTER — Encounter (HOSPITAL_BASED_OUTPATIENT_CLINIC_OR_DEPARTMENT_OTHER): Payer: Self-pay | Admitting: Physical Therapy

## 2022-09-24 ENCOUNTER — Ambulatory Visit (HOSPITAL_BASED_OUTPATIENT_CLINIC_OR_DEPARTMENT_OTHER): Payer: Medicare Other | Attending: Physical Medicine and Rehabilitation | Admitting: Physical Therapy

## 2022-09-24 DIAGNOSIS — R262 Difficulty in walking, not elsewhere classified: Secondary | ICD-10-CM | POA: Insufficient documentation

## 2022-09-24 DIAGNOSIS — R252 Cramp and spasm: Secondary | ICD-10-CM | POA: Insufficient documentation

## 2022-09-24 DIAGNOSIS — M5459 Other low back pain: Secondary | ICD-10-CM | POA: Insufficient documentation

## 2022-09-24 DIAGNOSIS — M6281 Muscle weakness (generalized): Secondary | ICD-10-CM | POA: Diagnosis not present

## 2022-09-24 NOTE — Therapy (Signed)
OUTPATIENT PHYSICAL THERAPY THORACOLUMBAR Treatment    Patient Name: Dawn [name] MRN: [id number] DOB:April 22, 1946, 77 y.o., female Today's Date: 09/17/2022  END OF SESSION:  PT End of Session - 09/17/22 1023     Visit Number 6    Number of Visits 16    Date for PT Re-Evaluation 11/24/22    Authorization Type progress note at 82    PT Start Time [id number]    PT Stop Time 1058    PT Time Calculation (min) 43 min    Activity Tolerance Patient tolerated treatment well    Behavior During Therapy [facility] for tasks assessed/performed             Past Medical History:  Diagnosis Date   Allergy    CAD (coronary artery disease)    s/p DES to RCA in 06/2021   Dry eyes    Endometrial polyp    Essential hypertension 08/25/2018   GERD (gastroesophageal reflux disease)    History of hiatal hernia    History of kidney stones    Hot flashes, menopausal 10/13/2011   Estradiol started    Hyperlipidemia    Jaundice as teenager   no problems since   Memory loss 09/21/2019     1/2 of feet numb all the time   Multiple sclerosis ([place]) dx 2001   Neuropathy    bilateral feet   Osteoarthritis    Sjogren's syndrome ([place]) dx oct 2021   sore muscvles, dry mouth and eyes   Stroke ([place])    Vertigo    Vision abnormalities    Past Surgical History:  Procedure Laterality Date   ABDOMINAL HYSTERECTOMY  2002   partial   COLONOSCOPY  01/27/2022   2 day prep   colonscopy  2011   CORONARY STENT INTERVENTION N/A 07/18/2021   Procedure: CORONARY STENT INTERVENTION;  Surgeon: Jettie Booze, MD;  Location: [place] [facility];  Service: Cardiovascular;  Laterality: N/A;   DILATATION & CURETTAGE/HYSTEROSCOPY WITH MYOSURE N/A 06/08/2020   Procedure: DILATATION & CURETTAGE/HYSTEROSCOPY/Polypectomy WITH MYOSURE;  Surgeon: Armandina Stammer, DO;  Location: [place];  Service: Gynecology;  Laterality: N/A;   LEFT HEART CATH AND CORONARY ANGIOGRAPHY N/A 07/18/2021   Procedure: LEFT  HEART CATH AND CORONARY ANGIOGRAPHY;  Surgeon: Jettie Booze, MD;  Location: [place] [facility];  Service: Cardiovascular;  Laterality: N/A;   LUMBAR FUSION  2001   TOTAL HIP ARTHROPLASTY Right 01/17/2021   Procedure: TOTAL HIP ARTHROPLASTY ANTERIOR APPROACH;  Surgeon: Rod Can, MD;  Location: [facility];  Service: Orthopedics;  Laterality: Right;   UPPER GI ENDOSCOPY  yrs ago   Patient Active Problem List   Diagnosis Date Noted   Positive colorectal cancer screening using Cologuard test 11/14/2021   Coronary artery disease    Statin intolerance 04/19/2021   Osteoarthritis of right hip 01/17/2021   Status post THR (total hip replacement) 01/17/2021   Chronic bilateral low back pain without sciatica 10/02/2020   Chronic pain syndrome 06/20/2020   Post laminectomy syndrome 06/20/2020   Sjogren's disease ([place]) 05/23/2020   Primary osteoarthritis of both hands 05/23/2020   Primary osteoarthritis of both feet 05/23/2020   Other secondary scoliosis, lumbar region 12/22/2019   Spondylosis without myelopathy or radiculopathy, lumbar region 12/22/2019   Cervical radiculopathy 10/18/2019   Numbness 09/21/2019   Bilateral carpal tunnel syndrome 09/21/2019   High risk medication use 09/21/2019   Memory loss 09/21/2019   Essential hypertension 08/25/2018   Abnormal SPEP 02/19/2018  Hand pain 02/20/2017   Multiple joint pain 02/18/2017   Disturbed cognition 06/25/2016   Sciatica, right side 10/30/2015   Chronic fatigue 08/04/2014   Gait disturbance 08/04/2014   Unspecified visual disturbance 01/31/2013   Transient vision disturbance 01/31/2013   Colon polyps 11/03/2011   POSTHERPETIC NEURALGIA 10/03/2009   DISPLCMT LUMBAR INTERVERT DISC W/O MYELOPATHY 09/05/2009   RENAL CALCULUS, RECURRENT 09/04/2009   Hyperlipidemia 08/31/2009   Multiple sclerosis ([place]) 08/31/2009   ALLERGIC RHINITIS 08/31/2009    PCP: Lynford Humphrey NP  REFERRING PROVIDER: Dr Laroy Apple   REFERRING  DIAG: Low Back Pain   Rationale for Evaluation and Treatment: Rehabilitation  THERAPY DIAG:  Difficulty in walking, not elsewhere classified  Cramp and spasm  Other low back pain  Muscle weakness (generalized)  ONSET DATE: About 11 days ago her nerve abiliation wore off.   SUBJECTIVE:                                                                                                                                                                                           SUBJECTIVE STATEMENT: The patient has had injections. She reports the pain has been improving and the injections helped significantly. She has been working on doing more of her exercises at home.  PERTINENT HISTORY:  Right Hip replacement 2022, Low back Pain, MS, TIA October 2023; MI, November 2022; CAD, Sjorgens syndrome  PAIN:  Are you having pain? Yes: NPRS scale: 3/10 Pain location: low back  Pain description: aching  Aggravating factors: standing and walking  Relieving factors: rest   Yes: NPRS scale: 4/10 right now Pain location: aching  Pain description: burning and aching  Aggravating factors: standing and walking  Relieving factors: rest   PRECAUTIONS: None  WEIGHT BEARING RESTRICTIONS: No  FALLS:  Has patient fallen in last 6 months? 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   LUMBAR ROM:   AROM eval  Flexion Limited 50 % with  pain   Extension No limit   Right lateral flexion   Left lateral flexion   Right rotation Limited 50% with pain   Left rotation Limited 50% with pain    (Blank rows = not tested)  LOWER EXTREMITY ROM:     Passive  Right eval Left eval  Hip flexion 105 90  Hip extension    Hip abduction    Hip adduction    Hip internal rotation painful Painful   Hip external rotation    Knee flexion    Knee extension    Ankle dorsiflexion    Ankle plantarflexion    Ankle inversion    Ankle eversion     (Blank rows = not tested)  LOWER EXTREMITY MMT:    MMT Right eval Left eval  Hip flexion 13.1 12.3  Hip extension    Hip abduction 11.5 10.1  Hip adduction    Hip internal rotation    Hip external rotation    Knee flexion    Knee extension 14.0 13.3  Ankle dorsiflexion    Ankle plantarflexion    Ankle inversion    Ankle eversion     (Blank rows = not tested)  FUNCTIONAL TESTS:  5 times sit to stand: 28 sec   GAIT:  TODAY'S TREATMENT:                                                                                                                              DATE:  3/6 trigger point release to lower lumbar spine; Trigger point release to both hips  Supine march red 2x20  Supine hip abduction 2x20 red Bridge 2x10  LTR x15   Row 2x15 red  Shoulder extension 2x15 red  Bicpeps curl 2x15 2lbs   All with cuing for core      2/28  Manual: bilateral roller to both hips; trigger point release to lower lumbar spine; Trigger point release to both hips   Supine march red 2x20  Supine hip abduction 2x20 red Bridge 2x10  SAQ 2x15 bilateral  Standing march 2x15  Standing heel raise 2x15    2/21:   Manual: bilateral roller to both hips; trigger point release to lower lumbar spine; Trigger point release to both hips   Supine march yellow 2x20  Supine hip  abduction 2x20 yellow    2/14 Trigger Point Dry-Needling  Treatment instructions: Expect mild to moderate muscle soreness. S/S of pneumothorax if dry needled over a lung field, and to seek immediate medical attention should they occur. Patient verbalized understanding of these instructions and education.  Patient Consent Given: Yes Education handout provided: Previously provided Muscles treated: lumbar paraspinals on the right with minimal depth 2nd to previous lumbar surgery;  bilateral gluteal in 3 spots on the right  and left all using a .30x50 needle Electrical stimulation performed: No Parameters: N/A Treatment response/outcome: great twitch    2/7 Trigger Point Dry-Needling  Treatment instructions: Expect mild to  moderate muscle soreness. S/S of pneumothorax if dry needled over a lung field, and to seek immediate medical attention should they occur. Patient verbalized understanding of these instructions and education.  Patient Consent Given: Yes Education handout provided: Previously provided Muscles treated: lumbar paraspinals on the right with minimal depth 2nd to previous lumbar surgery;  bilateral gluteal in 2 spots on the right all using a .30x50 needle Electrical stimulation performed: No Parameters: N/A Treatment response/outcome: great twitch   Manual: trigger point release to gluteals; roller to lateral leg and hip    LTR x20  Gluteal stretch 2x20 sec hold   Supine march 2x10  Supine hip abduction 2x15 red  Supine ball squeeze 2x10  SAQ 2x15   Seated laq 2x15  Standing heel raise 2x15  Standing march 2x15     Eval: Has previous HEP  Manual: roller to bilateral hips   PATIENT EDUCATION:  Education details: reviewed HEP, symptom management  Person educated: Patient Education method: Explanation, Demonstration, Tactile cues, Verbal cues, and Handouts Education comprehension: verbalized understanding, returned demonstration, verbal cues required, tactile  cues required, and needs further education  HOME EXERCISE PROGRAM: Has previous HEP. Will review next visit  ASSESSMENT:  CLINICAL IMPRESSION: Therapy was able to focus more on there-ex today 2nd to less pain in her lower back. She tolerated well. We performed standing exercises for posture and core. She had no significant increase in pain. We encouraged her to use the window the shot gives her to strengthen, but to listen to her back.   OBJECTIVE IMPAIRMENTS: Abnormal gait, decreased activity tolerance, decreased balance, decreased endurance, decreased knowledge of use of DME, decreased mobility, difficulty walking, decreased ROM, decreased strength, increased muscle spasms, and pain.   ACTIVITY LIMITATIONS: carrying, lifting, bending, standing, squatting, sleeping, stairs, transfers, and locomotion level  PARTICIPATION LIMITATIONS: meal prep, cleaning, laundry, driving, shopping, community activity, and yard work  PERSONAL FACTORS: Right Hip replacement 2022, Low back Pain, MS, TIA October 2023; MI, November 2022; CAD, Sjorgens syndrome are also affecting patient's functional outcome. MS  REHAB POTENTIAL: Good  CLINICAL DECISION MAKING: Evolving/moderate complexity declining mobility   EVALUATION COMPLEXITY: Moderate   GOALS: Goals reviewed with patient? No  SHORT TERM GOALS: Target date: 09/29/2022    Patient will increase gross bilateral lower extremity strength by 5 pounds Baseline: Goal status: INITIAL  2.  Patient will increase lumbar flexion by 10 degrees Baseline:  Goal status: INITIAL  3.  Patient will be independent with basic after exercise program and stretching program Baseline:  Goal status: INITIAL   LONG TERM GOALS: Target date: 09/29/2022     Patient will stand for greater than 30 minutes without increased pain Baseline:  Goal status: INITIAL  2.  Patient will pick item up off the floor without increased low back pain Baseline:  Goal status:  INITIAL  3.  Patient will ambulate community distances without report of increased low back pain Baseline:  Goal status: INITIAL  4.  Patient will report improved ability to sleep through the night secondary to back pain Baseline:  Goal status: INITIAL    PLAN:  PT FREQUENCY: 1-2x/week  PT DURATION: 16 weeks   PLANNED INTERVENTIONS: Therapeutic exercises, Therapeutic activity, Neuromuscular re-education, Balance training, Gait training, Patient/Family education, Self Care, Joint mobilization, Stair training, DME instructions, Aquatic Therapy, Dry Needling, Spinal mobilization, Cryotherapy, Moist heat, Ionotophoresis '4mg'$ /ml Dexamethasone, and Manual therapy.  PLAN FOR NEXT SESSION: Consider manual therapy to the back and hips, consider trigger point dry needling to  back and hips.  Reviewed basic HEP.  Begin active strengthening of hips and core.  Begin gait training and functional mobility training.   Carney Living, PT 09/17/2022, 10:48 AM

## 2022-09-30 ENCOUNTER — Other Ambulatory Visit: Payer: Self-pay | Admitting: Family

## 2022-09-30 DIAGNOSIS — I10 Essential (primary) hypertension: Secondary | ICD-10-CM

## 2022-10-01 ENCOUNTER — Ambulatory Visit (HOSPITAL_BASED_OUTPATIENT_CLINIC_OR_DEPARTMENT_OTHER): Payer: Medicare Other | Admitting: Physical Therapy

## 2022-10-01 DIAGNOSIS — R252 Cramp and spasm: Secondary | ICD-10-CM

## 2022-10-01 DIAGNOSIS — M6281 Muscle weakness (generalized): Secondary | ICD-10-CM

## 2022-10-01 DIAGNOSIS — R262 Difficulty in walking, not elsewhere classified: Secondary | ICD-10-CM | POA: Diagnosis not present

## 2022-10-01 DIAGNOSIS — M5459 Other low back pain: Secondary | ICD-10-CM | POA: Diagnosis not present

## 2022-10-02 ENCOUNTER — Telehealth: Payer: Self-pay | Admitting: Neurology

## 2022-10-02 ENCOUNTER — Encounter (HOSPITAL_BASED_OUTPATIENT_CLINIC_OR_DEPARTMENT_OTHER): Payer: Self-pay | Admitting: Physical Therapy

## 2022-10-02 DIAGNOSIS — H35363 Drusen (degenerative) of macula, bilateral: Secondary | ICD-10-CM | POA: Diagnosis not present

## 2022-10-02 DIAGNOSIS — H47323 Drusen of optic disc, bilateral: Secondary | ICD-10-CM | POA: Diagnosis not present

## 2022-10-02 DIAGNOSIS — H35033 Hypertensive retinopathy, bilateral: Secondary | ICD-10-CM | POA: Diagnosis not present

## 2022-10-02 DIAGNOSIS — H35013 Changes in retinal vascular appearance, bilateral: Secondary | ICD-10-CM | POA: Diagnosis not present

## 2022-10-02 LAB — HM DIABETES EYE EXAM

## 2022-10-02 MED ORDER — MODAFINIL 200 MG PO TABS: ORAL_TABLET | ORAL | 5 refills | Status: DC

## 2022-10-02 NOTE — Telephone Encounter (Signed)
Dr. Felecia Shelling, current rx written for 1 per day. Ok to update to 1 q am and 1/2 tab at lunch?

## 2022-10-02 NOTE — Telephone Encounter (Signed)
Pt called and stated she would like to change back to taking medication modafinil (PROVIGIL) 200 MG tablet, 1 tablet in the morning and a half at lunch time. Pt is requesting a call back to du=discuss with nurse.

## 2022-10-02 NOTE — Therapy (Signed)
OUTPATIENT PHYSICAL THERAPY THORACOLUMBAR Treatment    Patient Name: Dawn [name] MRN: [id number] DOB:October 15, 1945, 77 y.o., female Today's Date: 09/17/2022  END OF SESSION:  PT End of Session - 09/17/22 1023     Visit Number 6    Number of Visits 16    Date for PT Re-Evaluation 11/24/22    Authorization Type progress note at 64    PT Start Time [id number]    PT Stop Time 1058    PT Time Calculation (min) 43 min    Activity Tolerance Patient tolerated treatment well    Behavior During Therapy [facility] for tasks assessed/performed             Past Medical History:  Diagnosis Date   Allergy    CAD (coronary artery disease)    s/p DES to RCA in 06/2021   Dry eyes    Endometrial polyp    Essential hypertension 08/25/2018   GERD (gastroesophageal reflux disease)    History of hiatal hernia    History of kidney stones    Hot flashes, menopausal 10/13/2011   Estradiol started    Hyperlipidemia    Jaundice as teenager   no problems since   Memory loss 09/21/2019     1/2 of feet numb all the time   Multiple sclerosis ([place]) dx 2001   Neuropathy    bilateral feet   Osteoarthritis    Sjogren's syndrome ([place]) dx oct 2021   sore muscvles, dry mouth and eyes   Stroke ([place])    Vertigo    Vision abnormalities    Past Surgical History:  Procedure Laterality Date   ABDOMINAL HYSTERECTOMY  2002   partial   COLONOSCOPY  01/27/2022   2 day prep   colonscopy  2011   CORONARY STENT INTERVENTION N/A 07/18/2021   Procedure: CORONARY STENT INTERVENTION;  Surgeon: Jettie Booze, MD;  Location: [place] [facility];  Service: Cardiovascular;  Laterality: N/A;   DILATATION & CURETTAGE/HYSTEROSCOPY WITH MYOSURE N/A 06/08/2020   Procedure: DILATATION & CURETTAGE/HYSTEROSCOPY/Polypectomy WITH MYOSURE;  Surgeon: Armandina Stammer, DO;  Location: [place];  Service: Gynecology;  Laterality: N/A;   LEFT HEART CATH AND CORONARY ANGIOGRAPHY N/A 07/18/2021   Procedure: LEFT  HEART CATH AND CORONARY ANGIOGRAPHY;  Surgeon: Jettie Booze, MD;  Location: [place] [facility];  Service: Cardiovascular;  Laterality: N/A;   LUMBAR FUSION  2001   TOTAL HIP ARTHROPLASTY Right 01/17/2021   Procedure: TOTAL HIP ARTHROPLASTY ANTERIOR APPROACH;  Surgeon: Rod Can, MD;  Location: [facility];  Service: Orthopedics;  Laterality: Right;   UPPER GI ENDOSCOPY  yrs ago   Patient Active Problem List   Diagnosis Date Noted   Positive colorectal cancer screening using Cologuard test 11/14/2021   Coronary artery disease    Statin intolerance 04/19/2021   Osteoarthritis of right hip 01/17/2021   Status post THR (total hip replacement) 01/17/2021   Chronic bilateral low back pain without sciatica 10/02/2020   Chronic pain syndrome 06/20/2020   Post laminectomy syndrome 06/20/2020   Sjogren's disease ([place]) 05/23/2020   Primary osteoarthritis of both hands 05/23/2020   Primary osteoarthritis of both feet 05/23/2020   Other secondary scoliosis, lumbar region 12/22/2019   Spondylosis without myelopathy or radiculopathy, lumbar region 12/22/2019   Cervical radiculopathy 10/18/2019   Numbness 09/21/2019   Bilateral carpal tunnel syndrome 09/21/2019   High risk medication use 09/21/2019   Memory loss 09/21/2019   Essential hypertension 08/25/2018   Abnormal SPEP 02/19/2018  Hand pain 02/20/2017   Multiple joint pain 02/18/2017   Disturbed cognition 06/25/2016   Sciatica, right side 10/30/2015   Chronic fatigue 08/04/2014   Gait disturbance 08/04/2014   Unspecified visual disturbance 01/31/2013   Transient vision disturbance 01/31/2013   Colon polyps 11/03/2011   POSTHERPETIC NEURALGIA 10/03/2009   DISPLCMT LUMBAR INTERVERT DISC W/O MYELOPATHY 09/05/2009   RENAL CALCULUS, RECURRENT 09/04/2009   Hyperlipidemia 08/31/2009   Multiple sclerosis ([place]) 08/31/2009   ALLERGIC RHINITIS 08/31/2009    PCP: Lynford Humphrey NP  REFERRING PROVIDER: Dr Laroy Apple   REFERRING  DIAG: Low Back Pain   Rationale for Evaluation and Treatment: Rehabilitation  THERAPY DIAG:  Difficulty in walking, not elsewhere classified  Cramp and spasm  Other low back pain  Muscle weakness (generalized)  ONSET DATE: About 11 days ago her nerve abiliation wore off.   SUBJECTIVE:                                                                                                                                                                                           SUBJECTIVE STATEMENT: The patient has had injections. She reports the pain has been improving and the injections helped significantly. She has been working on doing more of her exercises at home.  PERTINENT HISTORY:  Right Hip replacement 2022, Low back Pain, MS, TIA October 2023; MI, November 2022; CAD, Sjorgens syndrome  PAIN:  Are you having pain? Yes: NPRS scale: 3/10 Pain location: low back  Pain description: aching  Aggravating factors: standing and walking  Relieving factors: rest   Yes: NPRS scale: 4/10 right now Pain location: aching  Pain description: burning and aching  Aggravating factors: standing and walking  Relieving factors: rest   PRECAUTIONS: None  WEIGHT BEARING RESTRICTIONS: No  FALLS:  Has patient fallen in last 6 months? 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   LUMBAR ROM:   AROM eval  Flexion Limited 50 % with  pain   Extension No limit   Right lateral flexion   Left lateral flexion   Right rotation Limited 50% with pain   Left rotation Limited 50% with pain    (Blank rows = not tested)  LOWER EXTREMITY ROM:     Passive  Right eval Left eval  Hip flexion 105 90  Hip extension    Hip abduction    Hip adduction    Hip internal rotation painful Painful   Hip external rotation    Knee flexion    Knee extension    Ankle dorsiflexion    Ankle plantarflexion    Ankle inversion    Ankle eversion     (Blank rows = not tested)  LOWER EXTREMITY MMT:    MMT Right eval Left eval  Hip flexion 13.1 12.3  Hip extension    Hip abduction 11.5 10.1  Hip adduction    Hip internal rotation    Hip external rotation    Knee flexion    Knee extension 14.0 13.3  Ankle dorsiflexion    Ankle plantarflexion    Ankle inversion    Ankle eversion     (Blank rows = not tested)  FUNCTIONAL TESTS:  5 times sit to stand: 28 sec   GAIT:  TODAY'S TREATMENT:                                                                                                                              DATE:  3/14 Supine hip abduction 2x20 red [place] 2x10  LTR x15   Standing weight shift x20  Standing slow march 2x10   trigger point release to lower lumbar spine; Trigger point release to both hips; roller to lateral hip and gluteal.   3/6 trigger point release to lower lumbar spine; Trigger point release to both hips  Supine march red 2x20  Supine hip abduction 2x20 red Bridge 2x10  LTR x15   Row 2x15 red  Shoulder extension 2x15 red  Bicpeps curl 2x15 2lbs   All with cuing for core      2/28  Manual: bilateral roller to both hips; trigger point release to lower lumbar spine; Trigger point release to both hips   Supine march red 2x20  Supine hip abduction 2x20 red Bridge 2x10  SAQ 2x15  bilateral  Standing march 2x15  Standing heel raise 2x15    2/21:   Manual: bilateral roller to both hips; trigger point release to lower lumbar spine; Trigger point release to both hips   Supine march yellow 2x20  Supine hip abduction 2x20 yellow    2/14 Trigger Point Dry-Needling  Treatment instructions: Expect mild to moderate muscle soreness. S/S of pneumothorax if dry needled over a lung field, and to seek immediate medical attention should they occur. Patient verbalized understanding of these instructions and education.  Patient Consent Given: Yes Education handout provided: Previously provided Muscles treated: lumbar paraspinals on the right with minimal depth  2nd to previous lumbar surgery;  bilateral gluteal in 3 spots on the right  and left all using a .30x50 needle Electrical stimulation performed: No Parameters: N/A Treatment response/outcome: great twitch    2/7 Trigger Point Dry-Needling  Treatment instructions: Expect mild to moderate muscle soreness. S/S of pneumothorax if dry needled over a lung field, and to seek immediate medical attention should they occur. Patient verbalized understanding of these instructions and education.  Patient Consent Given: Yes Education handout provided: Previously provided Muscles treated: lumbar paraspinals on the right with minimal depth 2nd to previous lumbar surgery;  bilateral gluteal in 2 spots on the right all using a .30x50 needle Electrical stimulation performed: No Parameters: N/A Treatment response/outcome: great twitch   Manual: trigger point release to gluteals; roller to lateral leg and hip    LTR x20  Gluteal stretch 2x20 sec hold   Supine march 2x10  Supine hip abduction 2x15 red  Supine ball squeeze 2x10  SAQ 2x15   Seated laq 2x15  Standing heel raise 2x15  Standing march 2x15     Eval: Has previous HEP  Manual: roller to bilateral hips   PATIENT EDUCATION:  Education details: reviewed HEP,  symptom management  Person educated: Patient Education method: Explanation, Demonstration, Tactile cues, Verbal cues, and Handouts Education comprehension: verbalized understanding, returned demonstration, verbal cues required, tactile cues required, and needs further education  HOME EXERCISE PROGRAM: Has previous HEP. Will review next visit  ASSESSMENT:  CLINICAL IMPRESSION: The patient was having more lateral hip and IT band pain today. She reported improved pain following manual therapy. We were able to complete exercises following manual therapy. She continues to work on her exercises. On days that she is having pain she was advised to work more on stretching and general mobility. On her good days she has been working on her exercises. We will continue to progress as tolerated.   OBJECTIVE IMPAIRMENTS: Abnormal gait, decreased activity tolerance, decreased balance, decreased endurance, decreased knowledge of use of DME, decreased mobility, difficulty walking, decreased ROM, decreased strength, increased muscle spasms, and pain.   ACTIVITY LIMITATIONS: carrying, lifting, bending, standing, squatting, sleeping, stairs, transfers, and locomotion level  PARTICIPATION LIMITATIONS: meal prep, cleaning, laundry, driving, shopping, community activity, and yard work  PERSONAL FACTORS: Right Hip replacement 2022, Low back Pain, MS, TIA October 2023; MI, November 2022; CAD, Sjorgens syndrome are also affecting patient's functional outcome. MS  REHAB POTENTIAL: Good  CLINICAL DECISION MAKING: Evolving/moderate complexity declining mobility   EVALUATION COMPLEXITY: Moderate   GOALS: Goals reviewed with patient? No  SHORT TERM GOALS: Target date: 09/29/2022    Patient will increase gross bilateral lower extremity strength by 5 pounds Baseline: Goal status: INITIAL  2.  Patient will increase lumbar flexion by 10 degrees Baseline:  Goal status: INITIAL  3.  Patient will be independent  with basic after exercise program and stretching program Baseline:  Goal status: INITIAL   LONG TERM GOALS: Target date: 09/29/2022     Patient will stand for greater than 30 minutes without increased pain Baseline:  Goal status: INITIAL  2.  Patient will pick item up off the floor without increased low back pain Baseline:  Goal status: INITIAL  3.  Patient will ambulate community distances without report of increased low back pain Baseline:  Goal status: INITIAL  4.  Patient will report improved ability to sleep through the night secondary to back pain Baseline:  Goal status: INITIAL    PLAN:  PT FREQUENCY: 1-2x/week  PT DURATION: 16 weeks   PLANNED INTERVENTIONS: Therapeutic exercises, Therapeutic activity, Neuromuscular re-education, Balance training, Gait training, Patient/Family education, Self Care, Joint mobilization, Stair training, DME instructions, Aquatic Therapy, Dry Needling, Spinal mobilization, Cryotherapy, Moist heat, Ionotophoresis '4mg'$ /ml Dexamethasone, and Manual therapy.  PLAN FOR NEXT SESSION: Consider manual therapy to the back and hips, consider trigger point dry needling to back and hips.  Reviewed basic HEP.  Begin active strengthening of hips and core.  Begin gait training and functional mobility training.   Carney Living, PT 09/17/2022, 10:48 AM

## 2022-10-07 ENCOUNTER — Encounter: Payer: Self-pay | Admitting: Internal Medicine

## 2022-10-07 NOTE — Telephone Encounter (Signed)
Pt is just checking on the provider switch. This has been ok'd by Dr. Harl Bowie.

## 2022-10-07 NOTE — Telephone Encounter (Signed)
error 

## 2022-10-08 ENCOUNTER — Ambulatory Visit (HOSPITAL_BASED_OUTPATIENT_CLINIC_OR_DEPARTMENT_OTHER): Payer: Medicare Other | Admitting: Physical Therapy

## 2022-10-08 ENCOUNTER — Encounter (HOSPITAL_BASED_OUTPATIENT_CLINIC_OR_DEPARTMENT_OTHER): Payer: Self-pay | Admitting: Physical Therapy

## 2022-10-08 DIAGNOSIS — M6281 Muscle weakness (generalized): Secondary | ICD-10-CM | POA: Diagnosis not present

## 2022-10-08 DIAGNOSIS — R262 Difficulty in walking, not elsewhere classified: Secondary | ICD-10-CM

## 2022-10-08 DIAGNOSIS — R252 Cramp and spasm: Secondary | ICD-10-CM

## 2022-10-08 DIAGNOSIS — M5459 Other low back pain: Secondary | ICD-10-CM | POA: Diagnosis not present

## 2022-10-08 NOTE — Therapy (Signed)
OUTPATIENT PHYSICAL THERAPY THORACOLUMBAR Treatment    Patient Name: Dawn [name] MRN: [id number] DOB:05/23/1946, 77 y.o., female Today's Date: 10/08/2022  END OF SESSION:    Past Medical History:  Diagnosis Date   Allergy    CAD (coronary artery disease)    s/p DES to RCA in 06/2021   Dry eyes    Endometrial polyp    Essential hypertension 08/25/2018   GERD (gastroesophageal reflux disease)    History of hiatal hernia    History of kidney stones    Hot flashes, menopausal 10/13/2011   Estradiol started    Hyperlipidemia    Jaundice as teenager   no problems since   Memory loss 09/21/2019     1/2 of feet numb all the time   Multiple sclerosis ([place]) dx 2001   Neuropathy    bilateral feet   Osteoarthritis    Sjogren's syndrome ([place]) dx oct 2021   sore muscvles, dry mouth and eyes   Stroke ([place])    Vertigo    Vision abnormalities    Past Surgical History:  Procedure Laterality Date   ABDOMINAL HYSTERECTOMY  2002   partial   COLONOSCOPY  01/27/2022   2 day prep   colonscopy  2011   CORONARY STENT INTERVENTION N/A 07/18/2021   Procedure: CORONARY STENT INTERVENTION;  Surgeon: Jettie Booze, MD;  Location: [facility] [facility];  Service: Cardiovascular;  Laterality: N/A;   DILATATION & CURETTAGE/HYSTEROSCOPY WITH MYOSURE N/A 06/08/2020   Procedure: DILATATION & CURETTAGE/HYSTEROSCOPY/Polypectomy WITH MYOSURE;  Surgeon: Armandina Stammer, DO;  Location: [place];  Service: Gynecology;  Laterality: N/A;   LEFT HEART CATH AND CORONARY ANGIOGRAPHY N/A 07/18/2021   Procedure: LEFT HEART CATH AND CORONARY ANGIOGRAPHY;  Surgeon: Jettie Booze, MD;  Location: [facility];  Service: Cardiovascular;  Laterality: N/A;   LUMBAR FUSION  2001   TOTAL HIP ARTHROPLASTY Right 01/17/2021   Procedure: TOTAL HIP ARTHROPLASTY ANTERIOR APPROACH;  Surgeon: Rod Can, MD;  Location: [facility];  Service: Orthopedics;  Laterality: Right;   UPPER GI  ENDOSCOPY  yrs ago   Patient Active Problem List   Diagnosis Date Noted   Positive colorectal cancer screening using Cologuard test 11/14/2021   Coronary artery disease    Statin intolerance 04/19/2021   Osteoarthritis of right hip 01/17/2021   Status post THR (total hip replacement) 01/17/2021   Chronic bilateral low back pain without sciatica 10/02/2020   Chronic pain syndrome 06/20/2020   Post laminectomy syndrome 06/20/2020   Sjogren's disease ([place]) 05/23/2020   Primary osteoarthritis of both hands 05/23/2020   Primary osteoarthritis of both feet 05/23/2020   Other secondary scoliosis, lumbar region 12/22/2019   Spondylosis without myelopathy or radiculopathy, lumbar region 12/22/2019   Cervical radiculopathy 10/18/2019   Numbness 09/21/2019   Bilateral carpal tunnel syndrome 09/21/2019   High risk medication use 09/21/2019   Memory loss 09/21/2019   Essential hypertension 08/25/2018   Abnormal SPEP 02/19/2018   Hand pain 02/20/2017   Multiple joint pain 02/18/2017   Disturbed cognition 06/25/2016   Sciatica, right side 10/30/2015   Chronic fatigue 08/04/2014   Gait disturbance 08/04/2014   Unspecified visual disturbance 01/31/2013   Transient vision disturbance 01/31/2013   Colon polyps 11/03/2011   POSTHERPETIC NEURALGIA 10/03/2009   DISPLCMT LUMBAR INTERVERT DISC W/O MYELOPATHY 09/05/2009   RENAL CALCULUS, RECURRENT 09/04/2009   Hyperlipidemia 08/31/2009   Multiple sclerosis ([place]) 08/31/2009   ALLERGIC RHINITIS 08/31/2009    PCP: Lynford Humphrey NP  REFERRING PROVIDER: Dr Laroy Apple   REFERRING DIAG: Low Back Pain   Rationale for Evaluation and Treatment: Rehabilitation  THERAPY DIAG:  No diagnosis found.  ONSET DATE: About 11 days ago her nerve abiliation wore off.   SUBJECTIVE:                                                                                                                                                                                            SUBJECTIVE STATEMENT: Pt is experiencing 3/10 pain. Is feeling some discomfort on the right anterior thigh. The pain comes and goes.    PERTINENT HISTORY:  Right Hip replacement 2022, Low back Pain, MS, TIA October 2023; MI, November 2022; CAD, Sjorgens syndrome  PAIN:  Are you having pain? Yes: NPRS scale: 3/10 Pain location: low back  Pain description: aching  Aggravating factors: standing and walking  Relieving factors: rest   Yes: NPRS scale: 4/10 right now Pain location: aching  Pain description: burning and aching  Aggravating factors: standing and walking  Relieving factors: rest   PRECAUTIONS: None  WEIGHT BEARING RESTRICTIONS: No  FALLS:  Has patient fallen in last 6 months? 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   LUMBAR ROM:   AROM eval  Flexion Limited 50 % with pain   Extension No limit   Right lateral flexion   Left lateral flexion   Right rotation Limited 50% with pain   Left rotation Limited 50% with pain    (Blank rows = not tested)  LOWER EXTREMITY ROM:     Passive  Right eval Left eval  Hip flexion 105 90  Hip extension    Hip abduction    Hip adduction    Hip internal rotation painful Painful   Hip external rotation    Knee flexion    Knee extension    Ankle  dorsiflexion    Ankle plantarflexion    Ankle inversion    Ankle  eversion     (Blank rows = not tested)  LOWER EXTREMITY MMT:    MMT Right eval Left eval  Hip flexion 13.1 12.3  Hip extension    Hip abduction 11.5 10.1  Hip adduction    Hip internal rotation    Hip external rotation    Knee flexion    Knee extension 14.0 13.3  Ankle dorsiflexion    Ankle plantarflexion    Ankle inversion    Ankle eversion     (Blank rows = not tested)  FUNCTIONAL TESTS:  5 times sit to stand: 28 sec   GAIT:  TODAY'S TREATMENT:                                                                                                                              DATE:  3/20 Nu step x 6 mins L3  trigger point release to lower lumbar spine; Trigger point release to both hips; roller to lateral hip, gluteal, and anterior ip. Hip ER stretching gentle in pain free limits.   LTR x20  Supine hip abduction 2x10  PPT x20     3/14 Supine hip abduction 2x20 red Bridge 2x10  LTR x15   Standing weight shift x20  Standing slow march 2x10   trigger point release to lower lumbar spine; Trigger point release to both hips; roller to lateral hip and gluteal.   3/6 trigger point release to lower lumbar spine; Trigger point release to both hips  Supine march red 2x20  Supine hip abduction 2x20 red Bridge 2x10  LTR x15   Row 2x15 red  Shoulder extension 2x15 red  Bicpeps curl 2x15 2lbs   All with cuing for core      2/28  Manual: bilateral roller to both hips; trigger point release to lower lumbar spine; Trigger point release to both hips   Supine march red 2x20  Supine hip abduction 2x20 red Bridge 2x10  SAQ 2x15 bilateral  Standing march 2x15  Standing heel raise 2x15    2/21:   Manual: bilateral roller to both hips; trigger point release to lower lumbar spine; Trigger point release to both hips   Supine march yellow 2x20  Supine hip abduction 2x20 yellow    2/14 Trigger  Point Dry-Needling  Treatment instructions: Expect mild to moderate muscle soreness. S/S of pneumothorax if dry needled over a lung field, and to seek immediate medical attention should they occur. Patient verbalized understanding of these instructions and education.  Patient Consent Given: Yes Education handout provided: Previously provided Muscles treated: lumbar paraspinals on the right with minimal depth 2nd to previous lumbar surgery;  bilateral gluteal in 3 spots on the right  and left all using a .30x50 needle Electrical stimulation performed: No Parameters: N/A Treatment response/outcome: great twitch    2/7 Trigger Point Dry-Needling  Treatment instructions: Expect mild to moderate muscle soreness. S/S of pneumothorax if dry needled over a lung field, and to seek  immediate medical attention should they occur. Patient verbalized understanding of these instructions and education.  Patient Consent Given: Yes Education handout provided: Previously provided Muscles treated: lumbar paraspinals on the right with minimal depth 2nd to previous lumbar surgery;  bilateral gluteal in 2 spots on the right all using a .30x50 needle Electrical stimulation performed: No Parameters: N/A Treatment response/outcome: great twitch   Manual: trigger point release to gluteals; roller to lateral leg and hip    LTR x20  Gluteal stretch 2x20 sec hold   Supine march 2x10  Supine hip abduction 2x15 red  Supine ball squeeze 2x10  SAQ 2x15   Seated laq 2x15  Standing heel raise 2x15  Standing march 2x15     Eval: Has previous HEP  Manual: roller to bilateral hips   PATIENT EDUCATION:  Education details: reviewed HEP, symptom management  Person educated: Patient Education method: Explanation, Demonstration, Tactile cues, Verbal cues, and Handouts Education comprehension: verbalized understanding, returned demonstration, verbal cues required, tactile cues required, and needs further  education  HOME EXERCISE PROGRAM: Has previous HEP. Will review next visit  ASSESSMENT:  CLINICAL IMPRESSION: The patient was having more lateral hip and IT band pain today. She reported improved pain following manual therapy. We were able to complete exercises following manual therapy. She continues to work on her exercises. On days that she is having pain she was advised to work more on stretching and general mobility. On her good days she has been working on her exercises. We will continue to progress as tolerated.   OBJECTIVE IMPAIRMENTS: Abnormal gait, decreased activity tolerance, decreased balance, decreased endurance, decreased knowledge of use of DME, decreased mobility, difficulty walking, decreased ROM, decreased strength, increased muscle spasms, and pain.   ACTIVITY LIMITATIONS: carrying, lifting, bending, standing, squatting, sleeping, stairs, transfers, and locomotion level  PARTICIPATION LIMITATIONS: meal prep, cleaning, laundry, driving, shopping, community activity, and yard work  PERSONAL FACTORS: Right Hip replacement 2022, Low back Pain, MS, TIA October 2023; MI, November 2022; CAD, Sjorgens syndrome are also affecting patient's functional outcome. MS  REHAB POTENTIAL: Good  CLINICAL DECISION MAKING: Evolving/moderate complexity declining mobility   EVALUATION COMPLEXITY: Moderate   GOALS: Goals reviewed with patient? No  SHORT TERM GOALS: Target date: 09/29/2022    Patient will increase gross bilateral lower extremity strength by 5 pounds Baseline: Goal status: INITIAL  2.  Patient will increase lumbar flexion by 10 degrees Baseline:  Goal status: INITIAL  3.  Patient will be independent with basic after exercise program and stretching program Baseline:  Goal status: INITIAL   LONG TERM GOALS: Target date: 09/29/2022     Patient will stand for greater than 30 minutes without increased pain Baseline:  Goal status: INITIAL  2.  Patient will pick  item up off the floor without increased low back pain Baseline:  Goal status: INITIAL  3.  Patient will ambulate community distances without report of increased low back pain Baseline:  Goal status: INITIAL  4.  Patient will report improved ability to sleep through the night secondary to back pain Baseline:  Goal status: INITIAL    PLAN:  PT FREQUENCY: 1-2x/week  PT DURATION: 16 weeks   PLANNED INTERVENTIONS: Therapeutic exercises, Therapeutic activity, Neuromuscular re-education, Balance training, Gait training, Patient/Family education, Self Care, Joint mobilization, Stair training, DME instructions, Aquatic Therapy, Dry Needling, Spinal mobilization, Cryotherapy, Moist heat, Ionotophoresis 4mg /ml Dexamethasone, and Manual therapy.  PLAN FOR NEXT SESSION: Consider manual therapy to the back and hips, consider trigger point dry needling  to back and hips.  Reviewed basic HEP.  Begin active strengthening of hips and core.  Begin gait training and functional mobility training.   Carney Living, PT 10/08/2022, 9:36 AM

## 2022-10-08 NOTE — Therapy (Signed)
OUTPATIENT PHYSICAL THERAPY THORACOLUMBAR Treatment    Patient Name: Dawn [name] MRN: [id number] DOB:April 22, 1946, 77 y.o., female Today's Date: 09/17/2022  END OF SESSION:  PT End of Session - 09/17/22 1023     Visit Number 6    Number of Visits 16    Date for PT Re-Evaluation 11/24/22    Authorization Type progress note at 82    PT Start Time [id number]    PT Stop Time 1058    PT Time Calculation (min) 43 min    Activity Tolerance Patient tolerated treatment well    Behavior During Therapy [facility] for tasks assessed/performed             Past Medical History:  Diagnosis Date   Allergy    CAD (coronary artery disease)    s/p DES to RCA in 06/2021   Dry eyes    Endometrial polyp    Essential hypertension 08/25/2018   GERD (gastroesophageal reflux disease)    History of hiatal hernia    History of kidney stones    Hot flashes, menopausal 10/13/2011   Estradiol started    Hyperlipidemia    Jaundice as teenager   no problems since   Memory loss 09/21/2019     1/2 of feet numb all the time   Multiple sclerosis ([place]) dx 2001   Neuropathy    bilateral feet   Osteoarthritis    Sjogren's syndrome ([place]) dx oct 2021   sore muscvles, dry mouth and eyes   Stroke ([place])    Vertigo    Vision abnormalities    Past Surgical History:  Procedure Laterality Date   ABDOMINAL HYSTERECTOMY  2002   partial   COLONOSCOPY  01/27/2022   2 day prep   colonscopy  2011   CORONARY STENT INTERVENTION N/A 07/18/2021   Procedure: CORONARY STENT INTERVENTION;  Surgeon: Jettie Booze, MD;  Location: [facility];  Service: Cardiovascular;  Laterality: N/A;   DILATATION & CURETTAGE/HYSTEROSCOPY WITH MYOSURE N/A 06/08/2020   Procedure: DILATATION & CURETTAGE/HYSTEROSCOPY/Polypectomy WITH MYOSURE;  Surgeon: Armandina Stammer, DO;  Location: [place];  Service: Gynecology;  Laterality: N/A;   LEFT HEART CATH AND CORONARY ANGIOGRAPHY N/A 07/18/2021   Procedure: LEFT  HEART CATH AND CORONARY ANGIOGRAPHY;  Surgeon: Jettie Booze, MD;  Location: [facility];  Service: Cardiovascular;  Laterality: N/A;   LUMBAR FUSION  2001   TOTAL HIP ARTHROPLASTY Right 01/17/2021   Procedure: TOTAL HIP ARTHROPLASTY ANTERIOR APPROACH;  Surgeon: Rod Can, MD;  Location: [facility];  Service: Orthopedics;  Laterality: Right;   UPPER GI ENDOSCOPY  yrs ago   Patient Active Problem List   Diagnosis Date Noted   Positive colorectal cancer screening using Cologuard test 11/14/2021   Coronary artery disease    Statin intolerance 04/19/2021   Osteoarthritis of right hip 01/17/2021   Status post THR (total hip replacement) 01/17/2021   Chronic bilateral low back pain without sciatica 10/02/2020   Chronic pain syndrome 06/20/2020   Post laminectomy syndrome 06/20/2020   Sjogren's disease ([place]) 05/23/2020   Primary osteoarthritis of both hands 05/23/2020   Primary osteoarthritis of both feet 05/23/2020   Other secondary scoliosis, lumbar region 12/22/2019   Spondylosis without myelopathy or radiculopathy, lumbar region 12/22/2019   Cervical radiculopathy 10/18/2019   Numbness 09/21/2019   Bilateral carpal tunnel syndrome 09/21/2019   High risk medication use 09/21/2019   Memory loss 09/21/2019   Essential hypertension 08/25/2018   Abnormal SPEP 02/19/2018  Hand pain 02/20/2017   Multiple joint pain 02/18/2017   Disturbed cognition 06/25/2016   Sciatica, right side 10/30/2015   Chronic fatigue 08/04/2014   Gait disturbance 08/04/2014   Unspecified visual disturbance 01/31/2013   Transient vision disturbance 01/31/2013   Colon polyps 11/03/2011   POSTHERPETIC NEURALGIA 10/03/2009   DISPLCMT LUMBAR INTERVERT DISC W/O MYELOPATHY 09/05/2009   RENAL CALCULUS, RECURRENT 09/04/2009   Hyperlipidemia 08/31/2009   Multiple sclerosis ([place]) 08/31/2009   ALLERGIC RHINITIS 08/31/2009    PCP: Lynford Humphrey NP  REFERRING PROVIDER: Dr Laroy Apple   REFERRING  DIAG: Low Back Pain   Rationale for Evaluation and Treatment: Rehabilitation  THERAPY DIAG:  Difficulty in walking, not elsewhere classified  Cramp and spasm  Other low back pain  Muscle weakness (generalized)  ONSET DATE: About 11 days ago her nerve abiliation wore off.   SUBJECTIVE:                                                                                                                                                                                           SUBJECTIVE STATEMENT: The patient has had injections. She reports the pain has been improving and the injections helped significantly. She has been working on doing more of her exercises at home.  PERTINENT HISTORY:  Right Hip replacement 2022, Low back Pain, MS, TIA October 2023; MI, November 2022; CAD, Sjorgens syndrome  PAIN:  Are you having pain? Yes: NPRS scale: 3/10 Pain location: low back  Pain description: aching  Aggravating factors: standing and walking  Relieving factors: rest   Yes: NPRS scale: 4/10 right now Pain location: aching  Pain description: burning and aching  Aggravating factors: standing and walking  Relieving factors: rest   PRECAUTIONS: None  WEIGHT BEARING RESTRICTIONS: No  FALLS:  Has patient fallen in last 6 months? 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   LUMBAR ROM:   AROM eval  Flexion Limited 50 % with  pain   Extension No limit   Right lateral flexion   Left lateral flexion   Right rotation Limited 50% with pain   Left rotation Limited 50% with pain    (Blank rows = not tested)  LOWER EXTREMITY ROM:     Passive  Right eval Left eval  Hip flexion 105 90  Hip extension    Hip abduction    Hip adduction    Hip internal rotation painful Painful   Hip external rotation    Knee flexion    Knee extension    Ankle dorsiflexion    Ankle plantarflexion    Ankle inversion    Ankle eversion     (Blank rows = not tested)  LOWER EXTREMITY MMT:    MMT Right eval Left eval  Hip flexion 13.1 12.3  Hip extension    Hip abduction 11.5 10.1  Hip adduction    Hip internal rotation    Hip external rotation    Knee flexion    Knee extension 14.0 13.3  Ankle dorsiflexion    Ankle plantarflexion    Ankle inversion    Ankle eversion     (Blank rows = not tested)  FUNCTIONAL TESTS:  5 times sit to stand: 28 sec   GAIT:  TODAY'S TREATMENT:                                                                                                                              DATE:  3/20 Trigger point release to lower lumbar spine; Trigger point release to right hip; roller to lateral and anterior hip.  LTR x20  Supine hip abduction green x20  Supine PPT x20  LAQ x20   Standing weight shift x20   Nu-step 5 min L5      3/14 Supine hip abduction 2x20 red Bridge 2x10  LTR x15   Standing weight shift x20  Standing slow march 2x10   trigger point release to lower lumbar spine; Trigger point release to both hips; roller to lateral hip and gluteal.    PATIENT EDUCATION:  Education details: reviewed HEP, symptom management  Person educated: Patient Education method: Explanation, Demonstration, Tactile cues, Verbal cues, and Handouts Education comprehension: verbalized  understanding, returned demonstration, verbal cues required, tactile cues required, and needs further education  HOME EXERCISE PROGRAM: Has previous HEP. Will review next visit  ASSESSMENT:  CLINICAL IMPRESSION: The patient had anterior hip pain today. We focused more on the anterior hip. She was advised to avoid hip flexion exercises for now. She was able to complete supine and standing exercises despite increase pain in her right hip today. Therapy will continue to progress patient as tolerated.   OBJECTIVE IMPAIRMENTS: Abnormal gait, decreased activity tolerance, decreased balance, decreased endurance, decreased knowledge of use of DME, decreased mobility, difficulty walking, decreased ROM, decreased strength, increased muscle spasms, and pain.   ACTIVITY LIMITATIONS: carrying, lifting, bending, standing, squatting, sleeping, stairs, transfers, and locomotion level  PARTICIPATION LIMITATIONS: meal prep, cleaning, laundry, driving, shopping, community activity, and yard work  PERSONAL FACTORS: Right Hip replacement 2022, Low back Pain, MS, TIA October 2023; MI, November 2022; CAD, Sjorgens syndrome are also affecting patient's functional outcome. MS  REHAB POTENTIAL: Good  CLINICAL DECISION MAKING: Evolving/moderate complexity declining mobility   EVALUATION COMPLEXITY: Moderate   GOALS: Goals reviewed with patient? No  SHORT TERM GOALS: Target date: 09/29/2022    Patient will increase gross bilateral lower extremity strength by 5 pounds Baseline: Goal status: INITIAL  2.  Patient will increase lumbar flexion by 10 degrees Baseline:  Goal status: INITIAL  3.  Patient will be independent with basic after exercise program and stretching program Baseline:  Goal status: INITIAL   LONG TERM GOALS: Target date: 09/29/2022     Patient will stand for greater than 30 minutes without increased pain Baseline:  Goal status: INITIAL  2.  Patient will pick item up off the floor  without increased low back pain Baseline:  Goal status: INITIAL  3.  Patient will ambulate community distances without report of increased low back pain Baseline:  Goal status: INITIAL  4.  Patient will report improved ability to sleep through the night secondary to back pain Baseline:  Goal status: INITIAL    PLAN:  PT FREQUENCY: 1-2x/week  PT DURATION: 16 weeks   PLANNED INTERVENTIONS: Therapeutic exercises, Therapeutic activity, Neuromuscular re-education, Balance training, Gait training, Patient/Family education, Self Care, Joint mobilization, Stair training, DME instructions, Aquatic Therapy, Dry Needling, Spinal mobilization, Cryotherapy, Moist heat, Ionotophoresis 4mg /ml Dexamethasone, and Manual therapy.  PLAN FOR NEXT SESSION: Consider manual therapy to the back and hips, consider trigger point dry needling to back and hips.  Reviewed basic HEP.  Begin active strengthening of hips and core.  Begin gait training and functional mobility training.   Carney Living, PT 09/17/2022, 10:48 AM

## 2022-10-09 ENCOUNTER — Telehealth: Payer: Self-pay | Admitting: Neurology

## 2022-10-09 MED ORDER — MODAFINIL 200 MG PO TABS: ORAL_TABLET | ORAL | 5 refills | Status: DC

## 2022-10-09 NOTE — Telephone Encounter (Signed)
Pt is asking for a call to discuss the dose of her modafinil (PROVIGIL) 200 MG tablet. She is also asking that [id number]

## 2022-10-09 NOTE — Telephone Encounter (Signed)
Called pt back. She wanted dose increased to 1 tab po q am and 1/2 each afternoon. Advised rx sent 10/02/22 to:  [id number] - [place], [place] - [place]    She wants [facility] cx [facility].   I called [facility] and cx rx modafinil sent. Sent request to MD to [facility]end to [facility] for [facility].

## 2022-10-14 ENCOUNTER — Ambulatory Visit (HOSPITAL_BASED_OUTPATIENT_CLINIC_OR_DEPARTMENT_OTHER): Payer: Medicare Other | Admitting: Physical Therapy

## 2022-10-14 ENCOUNTER — Encounter (HOSPITAL_BASED_OUTPATIENT_CLINIC_OR_DEPARTMENT_OTHER): Payer: Self-pay | Admitting: Physical Therapy

## 2022-10-14 DIAGNOSIS — M6281 Muscle weakness (generalized): Secondary | ICD-10-CM | POA: Diagnosis not present

## 2022-10-14 DIAGNOSIS — R252 Cramp and spasm: Secondary | ICD-10-CM | POA: Diagnosis not present

## 2022-10-14 DIAGNOSIS — R262 Difficulty in walking, not elsewhere classified: Secondary | ICD-10-CM | POA: Diagnosis not present

## 2022-10-14 DIAGNOSIS — M5459 Other low back pain: Secondary | ICD-10-CM | POA: Diagnosis not present

## 2022-10-14 NOTE — Therapy (Signed)
OUTPATIENT PHYSICAL THERAPY THORACOLUMBAR Treatment /progress note   Patient Name: Dawn [name] MRN: [id number] DOB:[birth year], 77 y.o., female Today's Date: 09/17/2022  END OF SESSION:  PT End of Session - 09/17/22 1023     Visit Number 6    Number of Visits 16    Date for PT Re-Evaluation 11/24/22    Authorization Type progress note at 42    PT Start Time [id number]    PT Stop Time 1058    PT Time Calculation (min) 43 min    Activity Tolerance Patient tolerated treatment well    Behavior During Therapy [facility] for tasks assessed/performed             Past Medical History:  Diagnosis Date   Allergy    CAD (coronary artery disease)    s/p DES to RCA in 06/2021   Dry eyes    Endometrial polyp    Essential hypertension 08/25/2018   GERD (gastroesophageal reflux disease)    History of hiatal hernia    History of kidney stones    Hot flashes, menopausal 10/13/2011   Estradiol started    Hyperlipidemia    Jaundice as teenager   no problems since   Memory loss 09/21/2019     1/2 of feet numb all the time   Multiple sclerosis ([place]) dx 2001   Neuropathy    bilateral feet   Osteoarthritis    Sjogren's syndrome ([place]) dx oct 2021   sore muscvles, dry mouth and eyes   Stroke ([place])    Vertigo    Vision abnormalities    Past Surgical History:  Procedure Laterality Date   ABDOMINAL HYSTERECTOMY  2002   partial   COLONOSCOPY  01/27/2022   2 day prep   colonscopy  2011   CORONARY STENT INTERVENTION N/A 07/18/2021   Procedure: CORONARY STENT INTERVENTION;  Surgeon: Jettie Booze, MD;  Location: [place] [facility];  Service: Cardiovascular;  Laterality: N/A;   DILATATION & CURETTAGE/HYSTEROSCOPY WITH MYOSURE N/A 06/08/2020   Procedure: DILATATION & CURETTAGE/HYSTEROSCOPY/Polypectomy WITH MYOSURE;  Surgeon: Armandina Stammer, DO;  Location: [place];  Service: Gynecology;  Laterality: N/A;   LEFT HEART CATH AND CORONARY ANGIOGRAPHY N/A 07/18/2021    Procedure: LEFT HEART CATH AND CORONARY ANGIOGRAPHY;  Surgeon: Jettie Booze, MD;  Location: [place] [facility];  Service: Cardiovascular;  Laterality: N/A;   LUMBAR FUSION  2001   TOTAL HIP ARTHROPLASTY Right 01/17/2021   Procedure: TOTAL HIP ARTHROPLASTY ANTERIOR APPROACH;  Surgeon: Rod Can, MD;  Location: [facility];  Service: Orthopedics;  Laterality: Right;   UPPER GI ENDOSCOPY  yrs ago   Patient Active Problem List   Diagnosis Date Noted   Positive colorectal cancer screening using Cologuard test 11/14/2021   Coronary artery disease    Statin intolerance 04/19/2021   Osteoarthritis of right hip 01/17/2021   Status post THR (total hip replacement) 01/17/2021   Chronic bilateral low back pain without sciatica 10/02/2020   Chronic pain syndrome 06/20/2020   Post laminectomy syndrome 06/20/2020   Sjogren's disease ([place]) 05/23/2020   Primary osteoarthritis of both hands 05/23/2020   Primary osteoarthritis of both feet 05/23/2020   Other secondary scoliosis, lumbar region 12/22/2019   Spondylosis without myelopathy or radiculopathy, lumbar region 12/22/2019   Cervical radiculopathy 10/18/2019   Numbness 09/21/2019   Bilateral carpal tunnel syndrome 09/21/2019   High risk medication use 09/21/2019   Memory loss 09/21/2019   Essential hypertension 08/25/2018   Abnormal SPEP 02/19/2018  Hand pain 02/20/2017   Multiple joint pain 02/18/2017   Disturbed cognition 06/25/2016   Sciatica, right side 10/30/2015   Chronic fatigue 08/04/2014   Gait disturbance 08/04/2014   Unspecified visual disturbance 01/31/2013   Transient vision disturbance 01/31/2013   Colon polyps 11/03/2011   POSTHERPETIC NEURALGIA 10/03/2009   DISPLCMT LUMBAR INTERVERT DISC W/O MYELOPATHY 09/05/2009   RENAL CALCULUS, RECURRENT 09/04/2009   Hyperlipidemia 08/31/2009   Multiple sclerosis ([place]) 08/31/2009   ALLERGIC RHINITIS 08/31/2009  Progress Note Reporting Period 08/04/2022 to 10/14/2022  See  note below for Objective Data and Assessment of Progress/Goals.     [name] Kitchen     PCP: Lynford Humphrey NP  REFERRING PROVIDER: Dr Laroy Apple   REFERRING DIAG: Low Back Pain   Rationale for Evaluation and Treatment: Rehabilitation  THERAPY DIAG:  Difficulty in walking, not elsewhere classified  Cramp and spasm  Other low back pain  Muscle weakness (generalized)  ONSET DATE: About 11 days ago her nerve abiliation wore off.   SUBJECTIVE:                                                                                                                                                                                           SUBJECTIVE STATEMENT: Patient continues to have pain in her gluteal. The pain was gone until Saturday but then she sat for olong periods of time of the weekend and it returned.  PERTINENT HISTORY:  Right Hip replacement 2022, Low back Pain, MS, TIA October 2023; MI, November 2022; CAD, Sjorgens syndrome  PAIN:  Are you having pain? Yes: NPRS scale: 3/10 Pain location: low back  Pain description: aching  Aggravating factors: standing and walking  Relieving factors: rest   Yes: NPRS scale: 4/10 right now Pain location: aching  Pain description: burning and aching  Aggravating factors: standing and walking  Relieving factors: rest   PRECAUTIONS: None  WEIGHT BEARING RESTRICTIONS: No  FALLS:  Has patient fallen in last 6 months? 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   LUMBAR ROM:   AROM eval  Flexion Limited 50 % with pain   Extension No limit   Right lateral flexion   Left lateral flexion   Right rotation Limited 50% with pain   Left rotation Limited 50% with pain    (Blank rows = not tested)  LOWER EXTREMITY ROM:     Passive  Right eval Left eval Right   Hip flexion 105 90 105  Hip extension   10 degrees from neutral before manual today. Too neutral after stretching   Hip abduction     Hip adduction     Hip internal rotation painful Painful  Painful   Hip external rotation   20 degrees with pain   Knee flexion     Knee extension     Ankle dorsiflexion     Ankle plantarflexion     Ankle inversion     Ankle eversion      (Blank rows = not tested)  LOWER EXTREMITY MMT:    MMT Right eval Left eval Right  Left   Hip flexion 13.1 12.3 15.1 26.1  Hip extension      Hip abduction 11.5 10.1 13.7 24.4  Hip adduction      Hip internal rotation      Hip external rotation      Knee flexion      Knee extension 14.0 13.3 17.5 33.3  Ankle dorsiflexion      Ankle plantarflexion      Ankle inversion      Ankle eversion       (Blank rows = not tested)  FUNCTIONAL TESTS:  5 times sit to stand: 28 sec   GAIT:  TODAY'S TREATMENT:                                                                                                                              DATE:  3/26 Manual: Side-lying hip flexor stretch, roller to right lateral leg, roll into the gluteal, trigger point release to anterior hip.  LTR x 20 Posterior pelvic tilt x 20  Ball roll out forward 5 x 5-second hold Lateral 5 x 5 seconds  3/20 Trigger point release to lower lumbar spine; Trigger point release to right hip; roller to lateral and anterior hip.  LTR x20  Supine hip  abduction green x20  Supine PPT x20  LAQ x20   Standing weight shift x20   Nu-step 5 min L5      3/14 Supine hip abduction 2x20 [place] 2x10  LTR x15   Standing weight shift x20  Standing slow march 2x10   trigger point release to lower lumbar spine; Trigger point release to both hips; roller to lateral hip and gluteal.    PATIENT EDUCATION:  Education details: reviewed HEP, symptom management  Person educated: Patient Education method: Explanation, Demonstration, Tactile cues, Verbal cues, and Handouts  Education comprehension: verbalized understanding, returned demonstration, verbal cues required, tactile cues required, and needs further education  HOME EXERCISE PROGRAM: Has previous HEP. Will review next visit  ASSESSMENT:  CLINICAL IMPRESSION: The patient continues to have a high of baseline levels of pain in her right hip. The pain is more intermittent though. After therapy sh ei s now having 2-3 days where she does not have pain.   She has significant trigger points in her anterior hip and lateral leg as well a as in her upper gluteal.  Overall her strength has improved.  She does feel like she has improved overall functional mobility.  She has been able to walk more in the community, and has done better performing ADLs.  She continues to have limited hip extension and hip ER.  Therapy will perform a 6-minute walk test next visit we are unable to perform today secondary to time patient will benefit from further skilled therapy 2-week 12 to continue to improve functional mobility and to reduce right hip pain with daily tasks.  OBJECTIVE IMPAIRMENTS: Abnormal gait, decreased activity tolerance, decreased balance, decreased endurance, decreased knowledge of use of DME, decreased mobility, difficulty walking, decreased ROM, decreased strength, increased muscle spasms, and pain.   ACTIVITY LIMITATIONS: carrying, lifting, bending, standing, squatting, sleeping, stairs,  transfers, and locomotion level  PARTICIPATION LIMITATIONS: meal prep, cleaning, laundry, driving, shopping, community activity, and yard work  PERSONAL FACTORS: Right Hip replacement 2022, Low back Pain, MS, TIA October 2023; MI, November 2022; CAD, Sjorgens syndrome are also affecting patient's functional outcome. MS  REHAB POTENTIAL: Good  CLINICAL DECISION MAKING: Evolving/moderate complexity declining mobility   EVALUATION COMPLEXITY: Moderate   GOALS: Goals reviewed with patient? No  SHORT TERM GOALS: Target date: 09/29/2022    Patient will increase gross bilateral lower extremity strength by 5 pounds Baseline: Goal status: INITIAL  2.  Patient will increase lumbar flexion by 10 degrees Baseline:  Goal status: INITIAL  3.  Patient will be independent with basic after exercise program and stretching program Baseline:  Goal status: INITIAL   LONG TERM GOALS: Target date: 09/29/2022     Patient will stand for greater than 30 minutes without increased pain Baseline:  Goal status: INITIAL  2.  Patient will pick item up off the floor without increased low back pain Baseline:  Goal status: INITIAL  3.  Patient will ambulate community distances without report of increased low back pain Baseline:  Goal status: INITIAL  4.  Patient will report improved ability to sleep through the night secondary to back pain Baseline:  Goal status: INITIAL    PLAN:  PT FREQUENCY: 1-2x/week  PT DURATION: 16 weeks   PLANNED INTERVENTIONS: Therapeutic exercises, Therapeutic activity, Neuromuscular re-education, Balance training, Gait training, Patient/Family education, Self Care, Joint mobilization, Stair training, DME instructions, Aquatic Therapy, Dry Needling, Spinal mobilization, Cryotherapy, Moist heat, Ionotophoresis 4mg /ml Dexamethasone, and Manual therapy.  PLAN FOR NEXT SESSION: Consider manual therapy to the back and hips, consider trigger point dry needling to back and  hips.  Reviewed basic HEP.  Begin active strengthening of hips and core.  Begin gait training and functional mobility training.   Carney Living, PT 09/17/2022, 10:48 AM

## 2022-10-15 ENCOUNTER — Encounter (HOSPITAL_BASED_OUTPATIENT_CLINIC_OR_DEPARTMENT_OTHER): Payer: Medicare Other | Admitting: Physical Therapy

## 2022-10-15 DIAGNOSIS — M533 Sacrococcygeal disorders, not elsewhere classified: Secondary | ICD-10-CM | POA: Diagnosis not present

## 2022-10-20 ENCOUNTER — Ambulatory Visit (INDEPENDENT_AMBULATORY_CARE_PROVIDER_SITE_OTHER): Payer: Medicare Other | Admitting: Neurology

## 2022-10-20 ENCOUNTER — Encounter: Payer: Self-pay | Admitting: Neurology

## 2022-10-20 VITALS — BP 161/82 | HR 61 | Ht 62.0 in | Wt 179.5 lb

## 2022-10-20 DIAGNOSIS — M7062 Trochanteric bursitis, left hip: Secondary | ICD-10-CM | POA: Diagnosis not present

## 2022-10-20 DIAGNOSIS — I639 Cerebral infarction, unspecified: Secondary | ICD-10-CM | POA: Diagnosis not present

## 2022-10-20 DIAGNOSIS — G35 Multiple sclerosis: Secondary | ICD-10-CM | POA: Diagnosis not present

## 2022-10-20 DIAGNOSIS — R269 Unspecified abnormalities of gait and mobility: Secondary | ICD-10-CM | POA: Diagnosis not present

## 2022-10-20 DIAGNOSIS — Z79899 Other long term (current) drug therapy: Secondary | ICD-10-CM

## 2022-10-20 DIAGNOSIS — M7061 Trochanteric bursitis, right hip: Secondary | ICD-10-CM

## 2022-10-20 MED ORDER — MODAFINIL 200 MG PO TABS: ORAL_TABLET | ORAL | 1 refills | Status: DC

## 2022-10-20 NOTE — Progress Notes (Signed)
[facility]  PATIENT: Dawn [name] DOB: Jun 07, 1946  REFERRING CLINICIAN: Aura Dials HISTORY FROM: Patient  REASON FOR VISIT: MS   HISTORICAL  CHIEF COMPLAINT:  Chief Complaint  Patient presents with   Follow-up    Pt in room #11 and alone. Pt here today to f/u on leflunomide for her MS and discuss MRI.    HISTORY OF PRESENT ILLNESS:  Dawn [name] is a 77 y.o. woman who was diagnosed with multiple sclerosis in 2001.      Update 10/20/2022 MS is stable.    She is off Leflunomide for Sjogren's and MS.    No new neurologic symptom or exacerbaton recently. Remains on ASA for CVA.  No new CVA/TIA symptm  She is having more lower back and hip.  SHe reports having piriformis muscle injections by orthopedics recently that helped a lot . She still has trochanteric bursa pain.     The right leg is more painful than left and sometimes feels it will give way.  She had a nerve block/RFA for her back in 2023  She is noting more dizziness -- lightheaded not vertigo.   She fell this morning.  She turned in the shower and felt lighheaded and ten fell.        Because of her cardiac history, she also saw cardiology.  She had a cardiac stent placed after presenting with chest pain and having a cardiac catheter one plus year ago.   She had been on ASA/Plavix  x 1 year but was on just ASA at the time of the stroke She was already on carvedilol and dose was increased.  .   Crestor was increased but she had hand pain and is hoping to switch.    She will be getting a 30 day heart monitor.    She also has CHF  She is on several BP med's:  carvedilol, hydralazone, losartan.         Her gait is reduced.  She feels balance is poor and also has pain which affects gait.   She feels lightheaded.   Many med's ere changed by cardiology.     She uses a cane due to the right leg giving out at times.   She is noting numbness but less pain in her hands.  At night, she gets painful tingling  in her legs.   She has urianry urgency.    She is sleeping poorly with tingling and cramps in legs and also have hot flashes.   Clonazepam helps insomnia some and spasticity at night as much as it used to.   It helps her fall asleep but then she wakes up 3 hours later.   She feels her cognition is about the same as last visit.      She notes some memory difficulties and wrd finding issues/.     She denies anxiety or depression at this time.   She has fatigue and takes 1 Provigil every morning and sometimes another 1/2 pill in the afternoon.   She generally eats well with a Mediterranean style diet.      Stroke History: She had a left medial parietal lobe stroke in September 2023.  The week before, she noted difficulty with reading and also had mild word finding difficulty.   Reading has returned to normal but she still notes mild word finding issues - though much better.  We checked a carotid ultrasoud last week 05/21/2022 and it was normal for age.  Many years ago, she had a left PICA cerebellar stroke and we had started ASA _______________________________________________ MS History:    In 1994, she had an episode of severe vertigo with gait ataxia. An MRI at that time was reportedly normal. The symptoms were felt to be due to allergies. About 7 years later she had vertigo, gait ataxia and diplopia. Also her left leg was giving out. Short after that she had a hysterectomy. Postoperatively, she was numb in both legs and she had a lumbar puncture which showed changes consistent with MS. Then, an MRI of the brain was performed showing changes consistent with MS. She was referred to Dr. Erling Cruz who her on Betaseron. Last year, she switched from Betaseron to Tecfidera, in the hope that she would be able to avoid shots as she was having severe skin reactions with knots.    However, she had a lot of difficulty tolerating Tecfidera and swithced to [place] in early 2016 and to [place] late 2016.  Switched to  Leflunomide for insurance reasons in 2019.    She stopped (age) 2023.   MRI brain 09/13/2018 shows multiple T2/flair hyperintense foci in the hemispheres.  The pattern is consistent with chronic demyelinating plaque associated with multiple sclerosis though some foci likely also represent chronic microvascular ischemic change.  None of the foci enhance or appear to be acute and there is no change compared to the 2017 MRI.      Remote left cerebellar infarction.      There is a normal enhancement pattern and no acute findings.  MRI brain 11/17/2019 showed Remote left PICA distribution CVA in the left cerebellar hemisphere.  This appears unchanged compared to the 2020 MRI.      Scattered T2/FLAIR hyperintense foci in the hemispheres.  Some foci are juxtacortical and periventricular consistent with multiple sclerosis though other foci are more nonspecific and could also be seen with chronic microvascular ischemic change.  None of the foci appear to be acute.  There are no new lesions compared to the 09/13/2018 MRI.  MRI cervical spine showed a normal spinal cord.     At C5-C6 there is a right paramedian disc protrusion causing mild right foraminal narrowing and mild spinal stenosis but no nerve root compression.   Minimal degenerative changes at C2-C3 and C3-C4 detailed above that do not lead to spinal stenosis or nerve root compression.    REVIEW OF SYSTEMS:  Constitutional: No fevers, chills, sweats, or change in appetite.   Reports fatigue Eyes: No visual changes, double vision, eye pain Ear, nose and throat: No hearing loss, ear pain, nasal congestion, sore throat Cardiovascular: No chest pain, palpitations Respiratory:  No shortness of breath at rest or with exertion.   No wheezes GastrointestinaI: No nausea, vomiting, diarrhea, abdominal pain, fecal incontinence Genitourinary:  see above. Musculoskeletal:  Pain in left greater than right hip Integumentary: No rash, pruritus.    Neurological: as  above Psychiatric: No depression at this time.  No anxiety Endocrine: No palpitations, diaphoresis, change in appetite, change in weigh or increased thirst Hematologic/Lymphatic:  No anemia, purpura, petechiae. Allergic/Immunologic: No itchy/runny eyes, nasal congestion, recent allergic reactions, rashes  ALLERGIES: Allergies  Allergen Reactions   Statins Other (See Comments)    Muscle pain; Tolerates Crestor     HOME MEDICATIONS: Outpatient Medications Prior to Visit  Medication Sig Dispense Refill   acetaminophen (TYLENOL) 500 MG tablet Take 1,000 mg by mouth every 6 (six) hours as needed for mild pain or moderate pain.  aspirin EC 81 MG tablet Take 81 mg by mouth daily. Swallow whole.     carvedilol (COREG) 6.25 MG tablet Take 1 tablet (6.25 mg total) by mouth 2 (two) times daily with a meal. 180 tablet 3   cholecalciferol (VITAMIN D) 25 MCG (1000 UNIT) tablet Take 2,000 Units by mouth daily.     clonazePAM (KLONOPIN) 0.5 MG tablet TAKE 1 TABLET(0.5 MG) BY MOUTH AT BEDTIME (Patient taking differently: Take 0.5 mg by mouth at bedtime.) 90 tablet 1   fexofenadine (ALLEGRA) 180 MG tablet Take 180 mg by mouth at bedtime.      Flaxseed, Linseed, (FLAXSEED OIL PO) Take 5-10 mLs by mouth 3 (three) times a week. Power     fluticasone (FLONASE) 50 MCG/ACT nasal spray Place 1 spray into both nostrils daily as needed for allergies or rhinitis.     hydrALAZINE (APRESOLINE) 25 MG tablet Take 1 tablet (25 mg total) by mouth 3 (three) times daily. 360 tablet 2   Lidocaine 4 % PTCH Place 1 patch onto the skin daily as needed (mild pain). Remove & Discard patch within 12 hours or as directed by MD     losartan (COZAAR) 100 MG tablet TAKE 1 TABLET(100 MG) BY MOUTH DAILY (Patient taking differently: Take 100 mg by mouth daily.) 90 tablet 2   melatonin 5 MG TABS Take 5 mg by mouth at bedtime as needed (Sleep).     modafinil (PROVIGIL) 200 MG tablet TAKE 1 TABLET(200 MG) BY MOUTH IN THE MORNING (Patient  taking differently: Take 200 mg by mouth in the morning.) 90 tablet 1   nitroGLYCERIN (NITROSTAT) 0.4 MG SL tablet Place 1 tablet (0.4 mg total) under the tongue every 5 (five) minutes as needed for chest pain. 30 tablet 0   pantoprazole (PROTONIX) 40 MG tablet Take 1 tablet (40 mg total) by mouth daily. 30 tablet 11   rosuvastatin (CRESTOR) 20 MG tablet Take 1 tablet (20 mg total) by mouth daily. 30 tablet 3   solifenacin (VESICARE) 5 MG tablet Take 5 mg by mouth daily.     leflunomide (ARAVA) 20 MG tablet TAKE 1 TABLET(20 MG) BY MOUTH DAILY (Patient taking differently: Take 20 mg by mouth daily.) 90 tablet 3   No facility-administered medications prior to visit.    PAST MEDICAL HISTORY: Past Medical History:  Diagnosis Date   Allergy    CAD (coronary artery disease)    s/p DES to RCA in 06/2021   Dry eyes    Endometrial polyp    Essential hypertension 08/25/2018   GERD (gastroesophageal reflux disease)    History of hiatal hernia    History of kidney stones    Hot flashes, menopausal 10/13/2011   Estradiol started    Hyperlipidemia    Jaundice as teenager   no problems since   Memory loss 09/21/2019     1/2 of feet numb all the time   Multiple sclerosis ([place]) dx 2001   Neuropathy    bilateral feet   Osteoarthritis    Sjogren's syndrome ([place]) dx oct 2021   sore muscvles, dry mouth and eyes   Stroke ([place])    Vertigo    Vision abnormalities     PAST SURGICAL HISTORY: Past Surgical History:  Procedure Laterality Date   ABDOMINAL HYSTERECTOMY  2002   partial   COLONOSCOPY  01/27/2022   2 day prep   colonscopy  2011   CORONARY STENT INTERVENTION N/A 07/18/2021   Procedure: CORONARY STENT INTERVENTION;  Surgeon: Irish Lack,  Charlann Lange, MD;  Location: [facility];  Service: Cardiovascular;  Laterality: N/A;   DILATATION & CURETTAGE/HYSTEROSCOPY WITH MYOSURE N/A 06/08/2020   Procedure: DILATATION & CURETTAGE/HYSTEROSCOPY/Polypectomy WITH MYOSURE;  Surgeon: Armandina Stammer, DO;  Location: [facility];  Service: Gynecology;  Laterality: N/A;   LEFT HEART CATH AND CORONARY ANGIOGRAPHY N/A 07/18/2021   Procedure: LEFT HEART CATH AND CORONARY ANGIOGRAPHY;  Surgeon: Jettie Booze, MD;  Location: [facility];  Service: Cardiovascular;  Laterality: N/A;   LUMBAR FUSION  2001   TOTAL HIP ARTHROPLASTY Right 01/17/2021   Procedure: TOTAL HIP ARTHROPLASTY ANTERIOR APPROACH;  Surgeon: Rod Can, MD;  Location: [facility];  Service: Orthopedics;  Laterality: Right;   UPPER GI ENDOSCOPY  yrs ago    FAMILY HISTORY: Family History  Problem Relation Age of Onset   Heart failure Mother    Heart failure Father    Arthritis Other    Hypertension Other    Colon cancer Neg Hx    Colon polyps Neg Hx    Esophageal cancer Neg Hx    Stomach cancer Neg Hx    Rectal cancer Neg Hx     SOCIAL HISTORY:  Social History   Socioeconomic History   Marital status: Widowed    Spouse name: Not on file   Number of children: Not on file   Years of education: Not on file   Highest education level: Not on file  Occupational History   Occupation: retired  Tobacco Use   Smoking status: Former    Packs/day: 0.50    Years: 15.00    Total pack years: 7.50    Types: Cigarettes    Quit date: 08/05/1983    Years since quitting: 38.8   Smokeless tobacco: Never  Vaping Use   Vaping Use: Never used  Substance and Sexual Activity   Alcohol use: Yes    Comment: rarely, maybe yearly drink   Drug use: Never   Sexual activity: Not Currently    Birth control/protection: Post-menopausal  Other Topics Concern   Not on file  Social History Narrative   Regular Exercise-no   Widowed   Retired   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     Tobacco use, amount per day now: former   Past tobacco use,  amount per day: 1/2-1 packet   How many years did you use tobacco: 12 years   Alcohol use (drinks per week): 0   Diet: mediterranean based   Do you drink/eat things with caffeine: yes   Marital status:        widow                          What year were you married? 1987   Do you live in a house, apartment, assisted living, condo, trailer, etc.? house   Is it one or more stories? 1 story   How many persons live in your home? 1   Do you have pets in your home?( please list) no   Current or past profession: Science writer    Do you exercise?          yes  Type and how often? Tai chi and pt therapy, stretches etc....   Do you have a living will? yes   Do you have a DNR form?      no                             If not, do you want to discuss one? yes   Do you have signed POA/HPOA forms?      no                  If so, please bring to you appointment   Social Determinants of Health   Financial Resource Strain: Not on file  Food Insecurity: No Food Insecurity (01/07/2022)   Hunger Vital Sign    Worried About Running Out of Food in the Last Year: Never true    Ran Out of Food in the Last Year: Never true  Transportation Needs: No Transportation Needs (01/07/2022)   PRAPARE - Hydrologist (Medical): No    Lack of Transportation (Non-Medical): No  Physical Activity: Not on file  Stress: Not on file  Social Connections: Not on file  Intimate Partner Violence: Not At Risk (02/03/2022)   Humiliation, Afraid, Rape, and Kick questionnaire    Fear of Current or Ex-Partner: No    Emotionally Abused: No    Physically Abused: No    Sexually Abused: No     PHYSICAL EXAM  Vitals:   05/29/22 1511  BP: 133/61  Pulse: 69  Weight: 167 lb (75.8 kg)  Height: 5\' 2"  (1.575 m)    Body mass index is 30.54 kg/m.   General: The patient is well-developed and well-nourished and in no acute distress  Musculoskeletal: She has mild  lumbar tenderness and moderately severe right and mild left - trochanteric bursa tenderness.  Neurologic Exam  Mental status: The patient is alert and oriented x 3 at the time of the examination.   apparently normal attention span, stm and concentration ability.   Speech is normal now.   .      Cranial nerves: Extraocular movements are full.  Visual fields are normal.  Facial strength and sensory are normal.  Hearing appeared to be symmetric  Motor:  Muscle bulk is normal but tone is symmetric, more on the left.. Strength is 4/5 in median innervated and 4+/5 and ulnar innervated intrinsic hand muscles.      Sensory:   Sensory testing is intact to touch and vibration in the arms.  She has reduced vibration sensation in the feet.  Coordination: Finger-nose-finger and heel-to-shin is performed better on the right than the left.  Gait and station: Station is normal.  The gait is wide.  Tandem gait is poor.   Romberg is negative.  Reflexes: Deep tendon reflexes are symmetric and normal bilaterally.      DIAGNOSTIC DATA (LABS, IMAGING, TESTING) - I reviewed patient records, labs, notes, testing and imaging myself where available.  Lab Results  Component Value Date   WBC 4.5 05/14/2022   HGB 13.3 05/14/2022   HCT 39.0 05/14/2022   MCV 93.0 05/14/2022   PLT 183 05/14/2022      Component Value Date/Time   NA 138 05/14/2022 1755   NA 138 07/12/2021 1031   K 3.9 05/14/2022 1755   CL 107 05/14/2022 1755   CO2 21 (L) 05/14/2022 1720   GLUCOSE 78 05/14/2022 1755   BUN 20 05/14/2022 1755  BUN 16 07/12/2021 1031   CREATININE 0.70 05/14/2022 1755   CREATININE 0.69 04/18/2022 0845   CALCIUM 9.4 05/14/2022 1720   PROT 6.3 (L) 05/14/2022 1720   PROT 6.1 10/02/2020 1607   ALBUMIN 4.0 05/14/2022 1720   ALBUMIN 4.2 10/02/2020 1607   AST 21 05/14/2022 1720   AST 20 03/09/2018 1342   ALT 14 05/14/2022 1720   ALT 22 03/09/2018 1342   ALKPHOS 93 05/14/2022 1720   BILITOT 0.5 05/14/2022  1720   BILITOT <0.2 10/02/2020 1607   BILITOT 0.4 03/09/2018 1342   GFRNONAA >60 05/14/2022 1720   GFRNONAA 91 01/25/2021 1527   GFRAA 105 01/25/2021 1527   Lab Results  Component Value Date   CHOL 146 04/18/2022   HDL 44 (L) 04/18/2022   LDLCALC 78 04/18/2022   LDLDIRECT 130.2 11/03/2011   TRIG 144 04/18/2022   CHOLHDL 3.3 04/18/2022   Lab Results  Component Value Date   HGBA1C 5.4 06/15/2021   No results found for: "VITAMINB12" Lab Results  Component Value Date   TSH 5.41 (H) 04/18/2022       ASSESSMENT AND PLAN  Cerebrovascular accident (CVA), unspecified mechanism (HCC)  Multiple sclerosis (HCC)  Abnormality of gait  High risk medication use  Lumbar spondylolysis  Chronic bilateral low back pain without sciatica  Transient vision disturbance  Sjogren's syndrome with other organ involvement (HCC)  History of MI (myocardial infarction)   1.    Her MS has been stable x years and she will remain off leflunomide .  She has had mild slow progression and we discussed assisted living as she has no family nearby.   2.    Bilateral trochanteric birsa injections with80 mg Depo-Medrol in [place] using sterile technique.  Pain was a little better afterwards.   3.    Continue clonazepam and modafinil.   Renew modafinil at one po bid  4.    Try to stay active. 5.    Return to see me in about 6 months but is advised to call sooner if she has new or worsening neurologic symptoms.   43-minute office visit with the majority of the time spent face-to-face for history and physical, discussion/counseling and decision-making.  Additional time with record review and documentation.    Simran Bomkamp A. Felecia Shelling, MD, PhD [id number], [id number] PM Certified in Neurology, Clinical Neurophysiology, Sleep Medicine, Pain Medicine and Neuroimaging  [facility] [address], [place], [place] [address] [phone]-' \

## 2022-10-22 ENCOUNTER — Other Ambulatory Visit: Payer: Self-pay | Admitting: Family

## 2022-10-22 ENCOUNTER — Other Ambulatory Visit: Payer: Medicare Other

## 2022-10-22 DIAGNOSIS — I10 Essential (primary) hypertension: Secondary | ICD-10-CM

## 2022-10-22 DIAGNOSIS — E782 Mixed hyperlipidemia: Secondary | ICD-10-CM | POA: Diagnosis not present

## 2022-10-22 NOTE — Progress Notes (Signed)
Labs ordered.

## 2022-10-23 ENCOUNTER — Ambulatory Visit (HOSPITAL_BASED_OUTPATIENT_CLINIC_OR_DEPARTMENT_OTHER): Payer: Medicare Other | Attending: Physical Medicine and Rehabilitation | Admitting: Physical Therapy

## 2022-10-23 DIAGNOSIS — M5459 Other low back pain: Secondary | ICD-10-CM | POA: Diagnosis not present

## 2022-10-23 DIAGNOSIS — M6281 Muscle weakness (generalized): Secondary | ICD-10-CM | POA: Diagnosis not present

## 2022-10-23 DIAGNOSIS — R262 Difficulty in walking, not elsewhere classified: Secondary | ICD-10-CM | POA: Diagnosis not present

## 2022-10-23 DIAGNOSIS — R252 Cramp and spasm: Secondary | ICD-10-CM | POA: Diagnosis not present

## 2022-10-23 LAB — CBC WITH DIFFERENTIAL/PLATELET
Absolute Monocytes: 471 cells/uL (ref 200–950)
Basophils Absolute: 23 cells/uL (ref 0–200)
Basophils Relative: 0.3 %
Eosinophils Absolute: 23 cells/uL (ref 15–500)
Eosinophils Relative: 0.3 %
HCT: 40.7 % (ref 35.0–45.0)
Hemoglobin: 13.5 g/dL (ref 11.7–15.5)
Lymphs Abs: 874 cells/uL (ref 850–3900)
MCH: 29.6 pg (ref 27.0–33.0)
MCHC: 33.2 g/dL (ref 32.0–36.0)
MCV: 89.3 fL (ref 80.0–100.0)
MPV: 10.9 fL (ref 7.5–12.5)
Monocytes Relative: 6.2 %
Neutro Abs: 6209 cells/uL (ref 1500–7800)
Neutrophils Relative %: 81.7 %
Platelets: 214 10*3/uL (ref 140–400)
RBC: 4.56 10*6/uL (ref 3.80–5.10)
RDW: 13 % (ref 11.0–15.0)
Total Lymphocyte: 11.5 %
WBC: 7.6 10*3/uL (ref 3.8–10.8)

## 2022-10-23 LAB — COMPLETE METABOLIC PANEL WITH GFR
AG Ratio: 2.3 (calc) (ref 1.0–2.5)
ALT: 17 U/L (ref 6–29)
AST: 15 U/L (ref 10–35)
Albumin: 4.1 g/dL (ref 3.6–5.1)
Alkaline phosphatase (APISO): 99 U/L (ref 37–153)
BUN/Creatinine Ratio: 38 (calc) — ABNORMAL HIGH (ref 6–22)
BUN: 26 mg/dL — ABNORMAL HIGH (ref 7–25)
CO2: 25 mmol/L (ref 20–32)
Calcium: 9.2 mg/dL (ref 8.6–10.4)
Chloride: 105 mmol/L (ref 98–110)
Creat: 0.69 mg/dL (ref 0.60–1.00)
Globulin: 1.8 g/dL (calc) — ABNORMAL LOW (ref 1.9–3.7)
Glucose, Bld: 93 mg/dL (ref 65–99)
Potassium: 4.6 mmol/L (ref 3.5–5.3)
Sodium: 139 mmol/L (ref 135–146)
Total Bilirubin: 0.3 mg/dL (ref 0.2–1.2)
Total Protein: 5.9 g/dL — ABNORMAL LOW (ref 6.1–8.1)
eGFR: 91 mL/min/{1.73_m2} (ref >=60)

## 2022-10-23 LAB — LIPID PANEL
Cholesterol: 137 mg/dL (ref <200)
HDL: 67 mg/dL (ref >=50)
LDL Cholesterol (Calc): 56 mg/dL (calc)
Non-HDL Cholesterol (Calc): 70 mg/dL (calc) (ref <130)
Total CHOL/HDL Ratio: 2 (calc) (ref <5.0)
Triglycerides: 64 mg/dL (ref <150)

## 2022-10-23 LAB — TSH: TSH: 2.41 mIU/L (ref 0.40–4.50)

## 2022-10-23 NOTE — Therapy (Signed)
OUTPATIENT PHYSICAL THERAPY THORACOLUMBAR Treatment /progress note   Patient Name: Dawn [name] MRN: [id number] DOB:[birth year], 77 y.o., female Today's Date: 10/24/2022  END OF SESSION:  PT End of Session - 10/23/22 1556     Visit Number 11    Number of Visits 16    Date for PT Re-Evaluation 11/24/22    Authorization Type progress note at 20    PT Start Time 0931    PT Stop Time 1013    PT Time Calculation (min) 42 min    Activity Tolerance Patient tolerated treatment well    Behavior During Therapy [facility] for tasks assessed/performed              Past Medical History:  Diagnosis Date   Allergy    CAD (coronary artery disease)    s/p DES to RCA in 06/2021   Dry eyes    Endometrial polyp    Essential hypertension 08/25/2018   GERD (gastroesophageal reflux disease)    History of hiatal hernia    History of kidney stones    Hot flashes, menopausal 10/13/2011   Estradiol started    Hyperlipidemia    Jaundice as teenager   no problems since   Memory loss 09/21/2019     1/2 of feet numb all the time   Multiple sclerosis dx 2001   Neuropathy    bilateral feet   Osteoarthritis    Sjogren's syndrome dx oct 2021   sore muscvles, dry mouth and eyes   Stroke    Vertigo    Vision abnormalities    Past Surgical History:  Procedure Laterality Date   ABDOMINAL HYSTERECTOMY  2002   partial   COLONOSCOPY  01/27/2022   2 day prep   colonscopy  2011   CORONARY STENT INTERVENTION N/A 07/18/2021   Procedure: CORONARY STENT INTERVENTION;  Surgeon: Corky Crafts, MD;  Location: [facility];  Service: Cardiovascular;  Laterality: N/A;   DILATATION & CURETTAGE/HYSTEROSCOPY WITH MYOSURE N/A 06/08/2020   Procedure: DILATATION & CURETTAGE/HYSTEROSCOPY/Polypectomy WITH MYOSURE;  Surgeon: Toy Baker, DO;  Location: [facility];  Service: Gynecology;  Laterality: N/A;   LEFT HEART CATH AND CORONARY ANGIOGRAPHY N/A 07/18/2021   Procedure: LEFT  HEART CATH AND CORONARY ANGIOGRAPHY;  Surgeon: Corky Crafts, MD;  Location: [facility];  Service: Cardiovascular;  Laterality: N/A;   LUMBAR FUSION  2001   TOTAL HIP ARTHROPLASTY Right 01/17/2021   Procedure: TOTAL HIP ARTHROPLASTY ANTERIOR APPROACH;  Surgeon: Samson Frederic, MD;  Location: [facility];  Service: Orthopedics;  Laterality: Right;   UPPER GI ENDOSCOPY  yrs ago   Patient Active Problem List   Diagnosis Date Noted   Positive colorectal cancer screening using Cologuard test 11/14/2021   Coronary artery disease    Statin intolerance 04/19/2021   Osteoarthritis of right hip 01/17/2021   Status post THR (total hip replacement) 01/17/2021   Chronic bilateral low back pain without sciatica 10/02/2020   Chronic pain syndrome 06/20/2020   Post laminectomy syndrome 06/20/2020   Sjogren's disease 05/23/2020   Primary osteoarthritis of both hands 05/23/2020   Primary osteoarthritis of both feet 05/23/2020   Other secondary scoliosis, lumbar region 12/22/2019   Spondylosis without myelopathy or radiculopathy, lumbar region 12/22/2019   Cervical radiculopathy 10/18/2019   Numbness 09/21/2019   Bilateral carpal tunnel syndrome 09/21/2019   High risk medication use 09/21/2019   Memory loss 09/21/2019   Essential hypertension 08/25/2018   Abnormal SPEP 02/19/2018   Hand  pain 02/20/2017   Multiple joint pain 02/18/2017   Disturbed cognition 06/25/2016   Sciatica, right side 10/30/2015   Chronic fatigue 08/04/2014   Gait disturbance 08/04/2014   Unspecified visual disturbance 01/31/2013   Transient vision disturbance 01/31/2013   Colon polyps 11/03/2011   POSTHERPETIC NEURALGIA 10/03/2009   DISPLCMT LUMBAR INTERVERT DISC W/O MYELOPATHY 09/05/2009   RENAL CALCULUS, RECURRENT 09/04/2009   Hyperlipidemia 08/31/2009   Multiple sclerosis (HCC) 08/31/2009   ALLERGIC RHINITIS 08/31/2009  Progress Note Reporting Period 08/04/2022 to 10/14/2022  See note below for Objective  Data and Assessment of Progress/Goals.     [name] Kitchen.     PCP: Iantha Falleninah Ngtich NP  REFERRING PROVIDER: Dr Romero BellingWesley Ibazebo   REFERRING DIAG: Low Back Pain   Rationale for Evaluation and Treatment: Rehabilitation  THERAPY DIAG:  Difficulty in walking, not elsewhere classified  Cramp and spasm  Other low back pain  Muscle weakness (generalized)  ONSET DATE: About 11 days ago her nerve abiliation wore off.   SUBJECTIVE:                                                                                                                                                                                           SUBJECTIVE STATEMENT: The patient had an injection on Thursday of last week in her lumbar spine .Her hip is now feeling better. She had a fall in the shower. She hit her eye but she is OK. The fall was on Monday.   PERTINENT HISTORY:  Right Hip replacement 2022, Low back Pain, MS, TIA October 2023; MI, November 2022; CAD, Sjorgens syndrome  PAIN:  Are you having pain? Yes: NPRS scale: 2/10 Pain location: right hip   Pain description: aching  Aggravating factors: standing and walking  Relieving factors: rest   Yes: NPRS scale: 4/10 right now Pain location: aching  Pain description: burning and aching  Aggravating factors: standing and walking  Relieving factors: rest   PRECAUTIONS: None  WEIGHT BEARING RESTRICTIONS: No  FALLS:  Has patient fallen in last 6 months? No  LIVING ENVIRONMENT: 3 steps into the house  OCCUPATION: retired Nature conservation officerart teacher   Recreation: get back to the pool    PLOF: Independent  PATIENT GOALS:  To have less pain/ to improve general mobility   NEXT MD VISIT:   OBJECTIVE:   DIAGNOSTIC FINDINGS:    PATIENT SURVEYS:  FOTO    SCREENING FOR RED FLAGS: Bowel or bladder incontinence: No Spinal tumors: No Cauda equina syndrome: No Compression fracture: No Abdominal aneurysm: No  COGNITION: Overall cognitive status: Within functional limits for  tasks assessed     SENSATION: Peripheral  neuropathy   MUSCLE LENGTH:  POSTURE: No Significant postural limitations  PALPATION: Significant tenderness to palpation in the hips and lower back.   LUMBAR ROM:   AROM eval  Flexion Limited 50 % with pain   Extension No limit   Right lateral flexion   Left lateral flexion   Right rotation Limited 50% with pain   Left rotation Limited 50% with pain    (Blank rows = not tested)  LOWER EXTREMITY ROM:     Passive  Right eval Left eval Right   Hip flexion 105 90 105  Hip extension   10 degrees from neutral before manual today. Too neutral after stretching   Hip abduction     Hip adduction     Hip internal rotation painful Painful  Painful   Hip external rotation   20 degrees with pain   Knee flexion     Knee extension     Ankle dorsiflexion     Ankle plantarflexion     Ankle inversion     Ankle eversion      (Blank rows = not tested)  LOWER EXTREMITY MMT:    MMT Right eval Left eval Right  3/26 Left  3/26  Hip flexion 13.1 12.3 15.1 26.1  Hip extension      Hip abduction 11.5 10.1 13.7 24.4  Hip adduction      Hip internal rotation      Hip external rotation      Knee flexion      Knee extension 14.0 13.3 17.5 33.3  Ankle dorsiflexion      Ankle plantarflexion      Ankle inversion      Ankle eversion       (Blank rows = not tested)  FUNCTIONAL TESTS:  5 times sit to stand: 28 sec   GAIT:  TODAY'S TREATMENT:                                                                                                                              DATE:  4/5 Manual: Side-lying hip flexor stretch, roller to right lateral leg, roll into the gluteal, trigger point release to anterior hip.  LTR x 20 Posterior pelvic tilt x 20  LAQ 2x10 red  Hip abduction 2x10 red  Seated March 2x10 red   Standing weight shift x20     3/26 Manual: Side-lying hip flexor stretch, roller to right lateral leg, roll into the gluteal,  trigger point release to anterior hip.  LTR x 20 Posterior pelvic tilt x 20  Ball roll out forward 5 x 5-second hold Lateral 5 x 5 seconds  3/20 Trigger point release to lower lumbar spine; Trigger point release to right hip; roller to lateral and anterior hip.  LTR x20  Supine hip abduction green x20  Supine PPT x20  LAQ x20   Standing weight shift x20   Nu-step 5 min L5      3/14  Supine hip abduction 2x20 red Bridge 2x10  LTR x15   Standing weight shift x20  Standing slow march 2x10   trigger point release to lower lumbar spine; Trigger point release to both hips; roller to lateral hip and gluteal.    PATIENT EDUCATION:  Education details: reviewed HEP, symptom management  Person educated: Patient Education method: Explanation, Demonstration, Tactile cues, Verbal cues, and Handouts Education comprehension: verbalized understanding, returned demonstration, verbal cues required, tactile cues required, and needs further education  HOME EXERCISE PROGRAM: Has previous HEP. Will review next visit  ASSESSMENT:  CLINICAL IMPRESSION: Therapy was able to focus more on strengthening today She had a significant reduction in trigger points in her low back and gluteals. She was advised to exercise as much as she can without exacerbating her symptoms. She was advised to use the window the injection has given her as much as she can. We will continue to progress as tolerated.    OBJECTIVE IMPAIRMENTS: Abnormal gait, decreased activity tolerance, decreased balance, decreased endurance, decreased knowledge of use of DME, decreased mobility, difficulty walking, decreased ROM, decreased strength, increased muscle spasms, and pain.   ACTIVITY LIMITATIONS: carrying, lifting, bending, standing, squatting, sleeping, stairs, transfers, and locomotion level  PARTICIPATION LIMITATIONS: meal prep, cleaning, laundry, driving, shopping, community activity, and yard work  PERSONAL FACTORS:  Right Hip replacement 2022, Low back Pain, MS, TIA October 2023; MI, November 2022; CAD, Sjorgens syndrome are also affecting patient's functional outcome. MS  REHAB POTENTIAL: Good  CLINICAL DECISION MAKING: Evolving/moderate complexity declining mobility   EVALUATION COMPLEXITY: Moderate   GOALS: Goals reviewed with patient? No  SHORT TERM GOALS: Target date: 09/29/2022    Patient will increase gross bilateral lower extremity strength by 5 pounds Baseline: Goal status: achieved revised to 45 lbs more then initial progress note 3/26  2.  Patient will increase lumbar flexion by 10 degrees Baseline:  Goal status: still painful ongoing 3/26  3.  Patient will be independent with basic after exercise program and stretching program Baseline:  Goal status: achieved 3/26   LONG TERM GOALS: Target date: 09/29/2022     Patient will stand for greater than 30 minutes without increased pain Baseline:  Goal status: depends on the day 3/26 2.  Patient will pick item up off the floor without increased low back pain Baseline:  Goal status: depends on the day 3/26  3.  Patient will ambulate community distances without report of increased low back pain Baseline:  Goal status: ongoing  3/26  4.  Patient will report improved ability to sleep through the night secondary to back pain Baseline:  Goal status: ongoing 3/26    PLAN:  PT FREQUENCY: 1-2x/week  PT DURATION: 16 weeks   PLANNED INTERVENTIONS: Therapeutic exercises, Therapeutic activity, Neuromuscular re-education, Balance training, Gait training, Patient/Family education, Self Care, Joint mobilization, Stair training, DME instructions, Aquatic Therapy, Dry Needling, Spinal mobilization, Cryotherapy, Moist heat, Ionotophoresis 4mg /ml Dexamethasone, and Manual therapy.  PLAN FOR NEXT SESSION: Consider manual therapy to the back and hips, consider trigger point dry needling to back and hips.  Reviewed basic HEP.  Begin active  strengthening of hips and core.  Begin gait training and functional mobility training.   Dessie Coma, PT 10/24/2022, 8:21 AM

## 2022-10-24 ENCOUNTER — Encounter (HOSPITAL_BASED_OUTPATIENT_CLINIC_OR_DEPARTMENT_OTHER): Payer: Self-pay | Admitting: Physical Therapy

## 2022-10-27 ENCOUNTER — Encounter: Payer: Self-pay | Admitting: Family

## 2022-10-27 ENCOUNTER — Ambulatory Visit (INDEPENDENT_AMBULATORY_CARE_PROVIDER_SITE_OTHER): Payer: Medicare Other | Admitting: Family

## 2022-10-27 VITALS — BP 126/82 | HR 63 | Temp 97.2°F | Resp 20 | Ht 62.0 in | Wt 183.2 lb

## 2022-10-27 DIAGNOSIS — I5032 Chronic diastolic (congestive) heart failure: Secondary | ICD-10-CM

## 2022-10-27 DIAGNOSIS — R42 Dizziness and giddiness: Secondary | ICD-10-CM

## 2022-10-27 DIAGNOSIS — I700 Atherosclerosis of aorta: Secondary | ICD-10-CM | POA: Diagnosis not present

## 2022-10-27 DIAGNOSIS — E782 Mixed hyperlipidemia: Secondary | ICD-10-CM | POA: Diagnosis not present

## 2022-10-27 DIAGNOSIS — I2510 Atherosclerotic heart disease of native coronary artery without angina pectoris: Secondary | ICD-10-CM | POA: Diagnosis not present

## 2022-10-27 DIAGNOSIS — R319 Hematuria, unspecified: Secondary | ICD-10-CM | POA: Diagnosis not present

## 2022-10-27 DIAGNOSIS — G35 Multiple sclerosis: Secondary | ICD-10-CM | POA: Diagnosis not present

## 2022-10-27 DIAGNOSIS — I10 Essential (primary) hypertension: Secondary | ICD-10-CM

## 2022-10-27 DIAGNOSIS — Z8673 Personal history of transient ischemic attack (TIA), and cerebral infarction without residual deficits: Secondary | ICD-10-CM

## 2022-10-27 LAB — POCT URINALYSIS DIPSTICK
Bilirubin, UA: NEGATIVE
Glucose, UA: NEGATIVE
Ketones, UA: NEGATIVE
Nitrite, UA: NEGATIVE
Protein, UA: POSITIVE — AB
Spec Grav, UA: 1.025 (ref 1.010–1.025)
Urobilinogen, UA: 0.2 E.U./dL
pH, UA: 6.5 (ref 5.0–8.0)

## 2022-10-27 NOTE — Progress Notes (Signed)
Provider: Richarda Bladeinah Elvan Ebron FNP-C   Cathie Bonnell, Donalee Citrininah C, NP  Patient Care Team: Nura Cahoon, Donalee Citrininah C, NP as PCP - General (Family Medicine) Maisie FusBranch, Mary E, MD as PCP - Cardiology (Cardiology) Valeria BatmanWhitfield, Peter W, MD (Inactive) as Consulting Physician (Orthopedic Surgery) [name], Pearletha Furlichard A, MD (Neurology) Krista Blueemarco, [place]az[place], OD (Optometry) Lynden AngLane, Elizabeth, NP as Nurse Practitioner (Obstetrics and Gynecology) Janalyn Harderafeen, Stuart, MD (Inactive) as Consulting Physician (Dermatology)  Extended Emergency Contact Information [facility] Emergency Contact[facility] Home Phone: [phone] Mobile Phone: [phone] Relation: Friend Secondary Emergency Contact: Cranford,Jane Mobile Phone: [phone] Relation: Friend  Code Status:  Full Code  Goals of care: Advanced Directive information    10/14/2022    6:59 PM  Advanced Directives  Does Patient Have a Medical Advance Directive? No  Would patient like information on creating a medical advance directive? No - Patient declined     Chief Complaint  Patient presents with   Medical Management of Chronic Issues    Patient presents today for a 6 month follow-up   Quality Metric Gaps    AWV, DEXA scan, TDAP, zoster, COVID#6    HPI:  Pt is a 77 y.o. female seen today for 6 months follow up for medical management of chronic diseases.  Has been working with Physical Therapy at [facility].Has been using her cane.  States would like another refer to cardiology Dr.Tiffany Duke SalviaRandolph for  CAD and CHF.   Due for Tdap vaccine and COVID-19 vaccine.  Also due for Bone density but decline. States fell one week ago on the left side in the shower .complains of soreness on left arm and shoulder.[facility] ,No limitation with movement of arm /shoulder. State has chronic dizziness/vertigo from MS and physical therapy maneuver does not help.    Past Medical History:  Diagnosis Date   Allergy    CAD (coronary artery  disease)    s/p DES to RCA in 06/2021   Dry eyes    Endometrial polyp    Essential hypertension 08/25/2018   GERD (gastroesophageal reflux disease)    History of hiatal hernia    History of kidney stones    Hot flashes, menopausal 10/13/2011   Estradiol started    Hyperlipidemia    Jaundice as teenager   no problems since   Memory loss 09/21/2019     1/2 of feet numb all the time   Multiple sclerosis dx 2001   Neuropathy    bilateral feet   Osteoarthritis    Sjogren's syndrome dx oct 2021   sore muscvles, dry mouth and eyes   Stroke    Vertigo    Vision abnormalities    Past Surgical History:  Procedure Laterality Date   ABDOMINAL HYSTERECTOMY  2002   partial   COLONOSCOPY  01/27/2022   2 day prep   colonscopy  2011   CORONARY STENT INTERVENTION N/A 07/18/2021   Procedure: CORONARY STENT INTERVENTION;  Surgeon: Corky CraftsVaranasi, Jayadeep S, MD;  Location: [facility];  Service: Cardiovascular;  Laterality: N/A;   DILATATION & CURETTAGE/HYSTEROSCOPY WITH MYOSURE N/A 06/08/2020   Procedure: DILATATION & CURETTAGE/HYSTEROSCOPY/Polypectomy WITH MYOSURE;  Surgeon: Toy BakerLaw, Cassandra A, DO;  Location: [facility];  Service: Gynecology;  Laterality: N/A;   LEFT HEART CATH AND CORONARY ANGIOGRAPHY N/A 07/18/2021   Procedure: LEFT HEART CATH AND CORONARY ANGIOGRAPHY;  Surgeon: Corky CraftsVaranasi, Jayadeep S, MD;  [facility];  Service: Cardiovascular;  Laterality: N/A;   LUMBAR FUSION  2001  TOTAL HIP ARTHROPLASTY Right 01/17/2021   Procedure: TOTAL HIP ARTHROPLASTY ANTERIOR APPROACH;  Surgeon: Samson Frederic, MD;  Location: [facility];  Service: Orthopedics;  Laterality: Right;   UPPER GI ENDOSCOPY  yrs ago    Allergies  Allergen Reactions   Statins Other (See Comments)    Muscle pain; Tolerates Crestor     Allergies as of 10/27/2022       Reactions   Statins Other (See Comments)   Muscle pain; Tolerates Crestor         Medication List         Accurate as of October 27, 2022 10:51 AM. If you have any questions, ask your nurse or doctor.          acetaminophen 500 MG tablet Commonly known as: TYLENOL Take 1,000 mg by mouth every 6 (six) hours as needed for mild pain or moderate pain.   aspirin EC 81 MG tablet Take 81 mg by mouth daily. Swallow whole.   carvedilol 6.25 MG tablet Commonly known as: COREG Take 1 tablet (6.25 mg total) by mouth 2 (two) times daily with a meal.   cholecalciferol 25 MCG (1000 UT) tablet Generic drug: Cholecalciferol Take 2,000 Units by mouth daily.   clonazePAM 0.5 MG tablet Commonly known as: KLONOPIN TAKE 1 TABLET BY MOUTH AT BEDTIME   fexofenadine 180 MG tablet Commonly known as: ALLEGRA Take 180 mg by mouth at bedtime.   FLAXSEED OIL PO Take 5-10 mLs by mouth 3 (three) times a week. Power   fluticasone 50 MCG/ACT nasal spray Commonly known as: FLONASE Place 1 spray into both nostrils daily as needed for allergies or rhinitis.   hydrALAZINE 25 MG tablet Commonly known as: APRESOLINE Take 1 tablet (25 mg total) by mouth 3 (three) times daily.   lidocaine 4 % Place 1 patch onto the skin daily as needed (mild pain). Remove & Discard patch within 12 hours or as directed by MD   losartan 100 MG tablet Commonly known as: COZAAR TAKE 1 TABLET(100 MG) BY MOUTH DAILY   melatonin 5 MG Tabs Take 5 mg by mouth at bedtime as needed (Sleep).   modafinil 200 MG tablet Commonly known as: PROVIGIL One po qAM and one po qNoon   nitroGLYCERIN 0.4 MG SL tablet Commonly known as: NITROSTAT Place 1 tablet (0.4 mg total) under the tongue every 5 (five) minutes as needed for chest pain.   pantoprazole 40 MG tablet Commonly known as: PROTONIX TAKE 1 TABLET(40 MG) BY MOUTH DAILY   rosuvastatin 20 MG tablet Commonly known as: CRESTOR Take 1 tablet (20 mg total) by mouth daily.   solifenacin 5 MG tablet Commonly known as: VESICARE Take 5 mg by mouth daily.        Review of Systems   Constitutional:  Negative for appetite change, chills, fatigue, fever and unexpected weight change.  HENT:  Negative for congestion, dental problem, ear discharge, ear pain, facial swelling, hearing loss, nosebleeds, postnasal drip, rhinorrhea, sinus pressure, sinus pain, sneezing, sore throat, tinnitus and trouble swallowing.   Eyes:  Negative for pain, discharge, redness, itching and visual disturbance.  Respiratory:  Negative for cough, chest tightness, shortness of breath and wheezing.   Cardiovascular:  Negative for chest pain, palpitations and leg swelling.  Gastrointestinal:  Negative for abdominal distention, abdominal pain, blood in stool, constipation, diarrhea, nausea and vomiting.  Endocrine: Negative for cold intolerance, heat intolerance, polydipsia, polyphagia and polyuria.  Genitourinary:  Negative for difficulty urinating, dysuria, flank pain, frequency and  urgency.  Musculoskeletal:  Positive for arthralgias and gait problem. Negative for back pain, joint swelling, myalgias, neck pain and neck stiffness.       Left shoulder soreness   Skin:  Negative for color change, pallor, rash and wound.  Neurological:  Negative for dizziness, syncope, speech difficulty, weakness, light-headedness, numbness and headaches.  Hematological:  Does not bruise/bleed easily.  Psychiatric/Behavioral:  Negative for agitation, behavioral problems, confusion, hallucinations, self-injury, sleep disturbance and suicidal ideas. The patient is not nervous/anxious.     Immunization History  Administered Date(s) Administered   Fluad Quad(high Dose 65+) 04/01/2019, 04/19/2021   Influenza Whole 05/14/2011   Influenza, High Dose Seasonal PF 04/30/2017, 05/15/2018, 05/18/2020   Influenza-Unspecified 03/04/2022   PFIZER(Purple Top)SARS-COV-2 Vaccination 08/27/2019, 09/21/2019, 03/07/2020, 11/16/2020   Pfizer Covid-19 Vaccine Bivalent Booster 41yrs & up 03/04/2022   Pneumococcal Conjugate-13 08/30/2018    Pneumococcal Polysaccharide-23 03/21/2009, 04/20/2020   Td (Adult), 2 Lf Tetanus Toxid, Preservative Free 12/04/2009   Zoster, Live 11/29/2009   Pertinent  Health Maintenance Due  Topic Date Due   DEXA SCAN  Never done   INFLUENZA VACCINE  02/19/2023   MAMMOGRAM  04/16/2023      04/22/2022    9:58 AM 05/08/2022   10:18 PM 05/09/2022   10:17 AM 05/14/2022    5:29 PM 10/27/2022   10:08 AM  Fall Risk  Falls in the past year? 0    1  Was there an injury with Fall? 0    1  Fall Risk Category Calculator 0    2  Fall Risk Category (Retired) Low      (RETIRED) Patient Fall Risk Level Low fall risk Low fall risk Low fall risk Moderate fall risk   Patient at Risk for Falls Due to History of fall(s)    History of fall(s)  Fall risk Follow up Falls evaluation completed    Falls evaluation completed   Functional Status Survey:    Vitals:   10/27/22 1003  BP: 126/82  Pulse: 63  Resp: 20  Temp: (!) 97.2 F (36.2 C)  SpO2: 97%  Weight: 183 lb 3.2 oz (83.1 kg)  Height: 5\' 2"  (1.575 m)   Body mass index is 33.51 kg/m. Physical Exam Vitals reviewed.  Constitutional:      General: She is not in acute distress.    Appearance: Normal appearance. She is obese. She is not ill-appearing or diaphoretic.  HENT:     Head: Normocephalic.     Right Ear: Tympanic membrane, ear canal and external ear normal. There is no impacted cerumen.     Left Ear: Tympanic membrane, ear canal and external ear normal. There is no impacted cerumen.     Nose: Nose normal. No congestion or rhinorrhea.     Mouth/Throat:     Mouth: Mucous membranes are moist.     Pharynx: Oropharynx is clear. No oropharyngeal exudate or posterior oropharyngeal erythema.  Eyes:     General: No scleral icterus.       Right eye: No discharge.        Left eye: No discharge.     Extraocular Movements: Extraocular movements intact.     Conjunctiva/sclera: Conjunctivae normal.     Pupils: Pupils are equal, round, and reactive to  light.  Neck:     Vascular: No carotid bruit.  Cardiovascular:     Rate and Rhythm: Normal rate and regular rhythm.     Pulses: Normal pulses.     Heart sounds: Normal heart  sounds. No murmur heard.    No friction rub. No gallop.  Pulmonary:     Effort: Pulmonary effort is normal. No respiratory distress.     Breath sounds: Normal breath sounds. No wheezing, rhonchi or rales.  Chest:     Chest wall: No tenderness.  Abdominal:     General: Bowel sounds are normal. There is no distension.     Palpations: Abdomen is soft. There is no mass.     Tenderness: There is no abdominal tenderness. There is no right CVA tenderness, left CVA tenderness, guarding or rebound.  Musculoskeletal:        General: No swelling or tenderness. Normal range of motion.     Cervical back: Normal range of motion. No rigidity or tenderness.     Right lower leg: No edema.     Left lower leg: No edema.  Lymphadenopathy:     Cervical: No cervical adenopathy.  Skin:    General: Skin is warm and dry.     Coloration: Skin is not pale.     Findings: No bruising, erythema, lesion or rash.  Neurological:     Mental Status: She is alert and oriented to person, place, and time.     Cranial Nerves: No cranial nerve deficit.     Sensory: No sensory deficit.     Motor: No weakness.     Coordination: Coordination normal.     Gait: Gait abnormal.  Psychiatric:        Mood and Affect: Mood normal.        Speech: Speech normal.        Behavior: Behavior normal.        Thought Content: Thought content normal.        Judgment: Judgment normal.     Labs reviewed: Recent Labs    05/14/22 1720 05/14/22 1755 07/23/22 0958 10/22/22 1018  NA 139 138 137 139  K 3.9 3.9 4.2 4.6  CL 109 107 102 105  CO2 21*  --  28 25  GLUCOSE 77 78 80 93  BUN 26*  CREATININE 0.91 0.70 0.68 0.69  CALCIUM 9.4  --  9.0 9.2   Recent Labs    05/09/22 0014 05/14/22 1720 07/23/22 0958 10/22/22 1018  AST ALT  ALKPHOS 104 93  --   --   BILITOT 0.5 0.5 0.4 0.3  PROT 6.7 6.3* 6.1 5.9*  ALBUMIN 4.1 4.0  --   --    Recent Labs    05/14/22 1720 05/14/22 1755 07/23/22 0958 10/22/22 1018  WBC 4.5  --  5.5 7.6  NEUTROABS 3.2  --  4,202 6,209  HGB 12.8 13.3 13.2 13.5  HCT 39.7 39.0 39.4 40.7  MCV 93.0  --  91.2 89.3  PLT 183  --  200 214   Lab Results  Component Value Date   TSH 2.41 10/22/2022   Lab Results  Component Value Date   HGBA1C 5.4 06/15/2021   Lab Results  Component Value Date   CHOL 137 10/22/2022   HDL 67 10/22/2022   LDLCALC 56 10/22/2022   LDLDIRECT 130.2 11/03/2011   TRIG 64 10/22/2022   CHOLHDL 2.0 10/22/2022    Significant Diagnostic Results in last 30 days:  No results found.  Assessment/Plan 1. Hematuria, unspecified type Afebrile  - POCT urinalysis dipstick red yellow cloudy urine positive for protein ,large blood and moderate 2+ leukocytes but negative for nitrites. -  discuss results with patient will send urine for culture then treat with antibiotics if indicated.will call and notify provider or go to ED if symptoms worsen  - Urine Culture  2. Essential hypertension B/p well controlled - continue on hydralazine,Losartan and Carvedilol  - TSH; Future - COMPLETE METABOLIC PANEL WITH GFR; Future - CBC with Differential/Platelet; Future  3. Mixed hyperlipidemia LDL at goal  -Continue rosuvastatin -Dietary modification advised. Advised patient in simple sneakers to increase activity. - Lipid panel; Future  4. Aortic atherosclerosis Continue rosuvastatin and Asprin  - Ambulatory referral to Cardiology  5. History of CVA (cerebrovascular accident) No residual deficit  - continue on ASA and Rosuvastatin  - Ambulatory referral to Cardiology  6. Multiple sclerosis Continue to follow up with Neurologist   7. Chronic diastolic congestive heart failure No signs of fluid overload.Not on diuretics  - continue to monitor  - Ambulatory  referral to Cardiology  8. Vertigo Chronic no symptoms during visit  - continue to follow up with Therapy   9. Coronary artery disease involving native coronary artery of native heart without angina pectoris Chest pin free - continue to control high risk factor  - Ambulatory referral to Cardiology  Family/ staff Communication: Reviewed plan of care with patient verbalized understanding   Labs/tests ordered:  - TSH; Future - COMPLETE METABOLIC PANEL WITH GFR; Future - CBC with Differential/Platelet; Future - Urine Culture  Next Appointment : Return in about 6 months (around 04/28/2023) for medical mangement of chronic issues., Fasting labs in 6 months prior to visit.   Caesar Bookman, NP

## 2022-10-28 ENCOUNTER — Ambulatory Visit (HOSPITAL_BASED_OUTPATIENT_CLINIC_OR_DEPARTMENT_OTHER): Payer: Medicare Other | Admitting: Physical Therapy

## 2022-10-28 ENCOUNTER — Encounter (HOSPITAL_BASED_OUTPATIENT_CLINIC_OR_DEPARTMENT_OTHER): Payer: Self-pay | Admitting: Physical Therapy

## 2022-10-28 DIAGNOSIS — R252 Cramp and spasm: Secondary | ICD-10-CM

## 2022-10-28 DIAGNOSIS — M6281 Muscle weakness (generalized): Secondary | ICD-10-CM | POA: Diagnosis not present

## 2022-10-28 DIAGNOSIS — R262 Difficulty in walking, not elsewhere classified: Secondary | ICD-10-CM

## 2022-10-28 DIAGNOSIS — M5459 Other low back pain: Secondary | ICD-10-CM

## 2022-10-28 LAB — URINE CULTURE
MICRO NUMBER:: 14796006
SPECIMEN QUALITY:: ADEQUATE

## 2022-10-28 NOTE — Therapy (Signed)
OUTPATIENT PHYSICAL THERAPY THORACOLUMBAR Treatment /progress note   Patient Name: Dawn [name] MRN: [id number] DOB:[birth year], 77 y.o., female Today's Date: 10/28/2022  END OF SESSION:  PT End of Session - 10/28/22 1114     Visit Number 12    Number of Visits 16    Date for PT Re-Evaluation 11/24/22    Authorization Type progress note at 20    PT Start Time 1015    PT Stop Time 1058    PT Time Calculation (min) 43 min    Activity Tolerance Patient tolerated treatment well    Behavior During Therapy [facility] for tasks assessed/performed              Past Medical History:  Diagnosis Date   Allergy    CAD (coronary artery disease)    s/p DES to RCA in 06/2021   Dry eyes    Endometrial polyp    Essential hypertension 08/25/2018   GERD (gastroesophageal reflux disease)    History of hiatal hernia    History of kidney stones    Hot flashes, menopausal 10/13/2011   Estradiol started    Hyperlipidemia    Jaundice as teenager   no problems since   Memory loss 09/21/2019     1/2 of feet numb all the time   Multiple sclerosis dx 2001   Neuropathy    bilateral feet   Osteoarthritis    Sjogren's syndrome dx oct 2021   sore muscvles, dry mouth and eyes   Stroke    Vertigo    Vision abnormalities    Past Surgical History:  Procedure Laterality Date   ABDOMINAL HYSTERECTOMY  2002   partial   COLONOSCOPY  01/27/2022   2 day prep   colonscopy  2011   CORONARY STENT INTERVENTION N/A 07/18/2021   Procedure: CORONARY STENT INTERVENTION;  Surgeon: Corky Crafts, MD;  Location: [facility];  Service: Cardiovascular;  Laterality: N/A;   DILATATION & CURETTAGE/HYSTEROSCOPY WITH MYOSURE N/A 06/08/2020   Procedure: DILATATION & CURETTAGE/HYSTEROSCOPY/Polypectomy WITH MYOSURE;  Surgeon: Toy Baker, DO;  Location: [facility];  Service: Gynecology;  Laterality: N/A;   LEFT HEART CATH AND CORONARY ANGIOGRAPHY N/A 07/18/2021   Procedure: LEFT  HEART CATH AND CORONARY ANGIOGRAPHY;  Surgeon: Corky Crafts, MD;  Location: [facility] INVASIVE CV [facility];  Service: Cardiovascular;  Laterality: N/A;   LUMBAR FUSION  2001   TOTAL HIP ARTHROPLASTY Right 01/17/2021   Procedure: TOTAL HIP ARTHROPLASTY ANTERIOR APPROACH;  Surgeon: Samson Frederic, MD;  Location: [facility];  Service: Orthopedics;  Laterality: Right;   UPPER GI ENDOSCOPY  yrs ago   Patient Active Problem List   Diagnosis Date Noted   Positive colorectal cancer screening using Cologuard test 11/14/2021   Coronary artery disease    Statin intolerance 04/19/2021   Osteoarthritis of right hip 01/17/2021   Status post THR (total hip replacement) 01/17/2021   Chronic bilateral low back pain without sciatica 10/02/2020   Chronic pain syndrome 06/20/2020   Post laminectomy syndrome 06/20/2020   Sjogren's disease 05/23/2020   Primary osteoarthritis of both hands 05/23/2020   Primary osteoarthritis of both feet 05/23/2020   Other secondary scoliosis, lumbar region 12/22/2019   Spondylosis without myelopathy or radiculopathy, lumbar region 12/22/2019   Cervical radiculopathy 10/18/2019   Numbness 09/21/2019   Bilateral carpal tunnel syndrome 09/21/2019   High risk medication use 09/21/2019   Memory loss 09/21/2019   Essential hypertension 08/25/2018   Abnormal SPEP 02/19/2018   Hand  pain 02/20/2017   Multiple joint pain 02/18/2017   Disturbed cognition 06/25/2016   Sciatica, right side 10/30/2015   Chronic fatigue 08/04/2014   Gait disturbance 08/04/2014   Unspecified visual disturbance 01/31/2013   Transient vision disturbance 01/31/2013   Colon polyps 11/03/2011   POSTHERPETIC NEURALGIA 10/03/2009   DISPLCMT LUMBAR INTERVERT DISC W/O MYELOPATHY 09/05/2009   RENAL CALCULUS, RECURRENT 09/04/2009   Hyperlipidemia 08/31/2009   Multiple sclerosis (HCC) 08/31/2009   ALLERGIC RHINITIS 08/31/2009  Progress Note Reporting Period 08/04/2022 to 10/14/2022  See note below for Objective  Data and Assessment of Progress/Goals.     [name] Kitchen     PCP: Iantha Fallen NP  REFERRING PROVIDER: Dr Romero Belling   REFERRING DIAG: Low Back Pain   Rationale for Evaluation and Treatment: Rehabilitation  THERAPY DIAG:  Difficulty in walking, not elsewhere classified  Cramp and spasm  Other low back pain  Muscle weakness (generalized)  ONSET DATE: About 11 days ago her nerve abiliation wore off.   SUBJECTIVE:                                                                                                                                                                                           SUBJECTIVE STATEMENT: The patient had an injection on Thursday of last week in her lumbar spine .Her hip is now feeling better. She had a fall in the shower. She hit her eye but she is OK. The fall was on Monday.   PERTINENT HISTORY:  Right Hip replacement 2022, Low back Pain, MS, TIA October 2023; MI, November 2022; CAD, Sjorgens syndrome  PAIN:  Are you having pain? Yes: NPRS scale: 2/10 Pain location: right hip   Pain description: aching  Aggravating factors: standing and walking  Relieving factors: rest   Yes: NPRS scale: 4/10 right now Pain location: aching  Pain description: burning and aching  Aggravating factors: standing and walking  Relieving factors: rest   PRECAUTIONS: None  WEIGHT BEARING RESTRICTIONS: No  FALLS:  Has patient fallen in last 6 months? 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   LUMBAR ROM:   AROM eval  Flexion Limited 50 % with pain   Extension No limit   Right lateral flexion   Left lateral flexion   Right rotation Limited 50% with pain   Left rotation Limited 50% with pain    (Blank rows = not tested)  LOWER EXTREMITY ROM:     Passive  Right eval Left eval Right   Hip flexion 105 90 105  Hip extension   10 degrees from neutral before manual today. Too neutral after stretching   Hip abduction     Hip adduction     Hip internal rotation painful Painful  Painful   Hip external rotation   20 degrees with pain   Knee flexion     Knee extension     Ankle dorsiflexion     Ankle plantarflexion     Ankle inversion     Ankle eversion      (Blank rows = not tested)  LOWER EXTREMITY MMT:    MMT Right eval Left eval Right  3/26 Left  3/26  Hip flexion 13.1 12.3 15.1 26.1  Hip extension      Hip abduction 11.5 10.1 13.7 24.4  Hip adduction      Hip internal rotation      Hip external rotation      Knee flexion      Knee extension 14.0 13.3 17.5 33.3  Ankle dorsiflexion      Ankle plantarflexion      Ankle inversion      Ankle eversion       (Blank rows = not tested)  FUNCTIONAL TESTS:  5 times sit to stand: 28 sec   GAIT:  TODAY'S TREATMENT:                                                                                                                              DATE:  4/9 Manual: Side-lying hip flexor stretch, roller to right lateral leg, roll into the gluteal, trigger point release to anterior hip.  Nu-step 5 min    LTR x 20 Posterior pelvic tilt x 20  LAQ 2x10 red  Hip abduction 2x10 red   Standing weight shift x20     4/5 Manual: Side-lying hip flexor stretch, roller to right lateral leg, roll into the gluteal, trigger  point release to anterior hip.  LTR x 20 Posterior pelvic tilt x 20  LAQ 2x10 red  Hip abduction 2x10 red  Seated March 2x10 red   Standing weight shift x20      3/26 Manual: Side-lying hip flexor stretch, roller to right lateral leg, roll into the gluteal, trigger point release to anterior hip.  LTR x 20 Posterior pelvic tilt x 20  Ball roll out forward 5 x 5-second hold Lateral 5 x 5  seconds  3/20 Trigger point release to lower lumbar spine; Trigger point release to right hip; roller to lateral and anterior hip.  LTR x20  Supine hip abduction green x20  Supine PPT x20  LAQ x20   Standing weight shift x20   Nu-step 5 min L5      3/14 Supine hip abduction 2x20 red Bridge 2x10  LTR x15   Standing weight shift x20  Standing slow march 2x10   trigger point release to lower lumbar spine; Trigger point release to both hips; roller to lateral hip and gluteal.    PATIENT EDUCATION:  Education details: reviewed HEP, symptom management  Person educated: Patient Education method: Explanation, Demonstration, Tactile cues, Verbal cues, and Handouts Education comprehension: verbalized understanding, returned demonstration, verbal cues required, tactile cues required, and needs further education  HOME EXERCISE PROGRAM: Has previous HEP. Will review next visit  ASSESSMENT:  CLINICAL IMPRESSION: The patient continues to have significant tightness in her anterior hip and tenderness to palpation in the anterior hip. Therapy focused on manual therapy to reduce these limitations. She reported less pain and improved movement following manual therapy.. We reviewed standing and sitting exercises today. We will continue to progress as tolerated.   OBJECTIVE IMPAIRMENTS: Abnormal gait, decreased activity tolerance, decreased balance, decreased endurance, decreased knowledge of use of DME, decreased mobility, difficulty walking, decreased ROM, decreased strength, increased muscle  spasms, and pain.   ACTIVITY LIMITATIONS: carrying, lifting, bending, standing, squatting, sleeping, stairs, transfers, and locomotion level  PARTICIPATION LIMITATIONS: meal prep, cleaning, laundry, driving, shopping, community activity, and yard work  PERSONAL FACTORS: Right Hip replacement 2022, Low back Pain, MS, TIA October 2023; MI, November 2022; CAD, Sjorgens syndrome are also affecting patient's functional outcome. MS  REHAB POTENTIAL: Good  CLINICAL DECISION MAKING: Evolving/moderate complexity declining mobility   EVALUATION COMPLEXITY: Moderate   GOALS: Goals reviewed with patient? No  SHORT TERM GOALS: Target date: 09/29/2022    Patient will increase gross bilateral lower extremity strength by 5 pounds Baseline: Goal status: INITIAL  2.  Patient will increase lumbar flexion by 10 degrees Baseline:  Goal status: INITIAL  3.  Patient will be independent with basic after exercise program and stretching program Baseline:  Goal status: INITIAL   LONG TERM GOALS: Target date: 09/29/2022     Patient will stand for greater than 30 minutes without increased pain Baseline:  Goal status: INITIAL  2.  Patient will pick item up off the floor without increased low back pain Baseline:  Goal status: INITIAL  3.  Patient will ambulate community distances without report of increased low back pain Baseline:  Goal status: INITIAL  4.  Patient will report improved ability to sleep through the night secondary to back pain Baseline:  Goal status: INITIAL    PLAN:  PT FREQUENCY: 1-2x/week  PT DURATION: 16 weeks   PLANNED INTERVENTIONS: Therapeutic exercises, Therapeutic activity, Neuromuscular re-education, Balance training, Gait training, Patient/Family education, Self Care, Joint mobilization, Stair training, DME instructions, Aquatic Therapy, Dry Needling, Spinal mobilization, Cryotherapy, Moist heat, Ionotophoresis 4mg /ml Dexamethasone, and Manual therapy.  PLAN  FOR NEXT SESSION: Consider manual therapy to the back and hips, consider trigger point dry needling to back and hips.  Reviewed basic HEP.  Begin active strengthening of hips and core.  Begin gait training and functional mobility training.   Dessie Comaavid J Talisa Petrak, PT 10/28/2022, 11:15 AM

## 2022-10-29 NOTE — Telephone Encounter (Signed)
Pt would like to know if you would be willing to take her on as new pt

## 2022-11-03 NOTE — Telephone Encounter (Signed)
Patient would like an update on if she can be seen by Dr. Duke Salvia

## 2022-11-03 NOTE — Telephone Encounter (Signed)
Dr. Duke Salvia are you okay with this provider switch

## 2022-11-04 DIAGNOSIS — M25551 Pain in right hip: Secondary | ICD-10-CM | POA: Diagnosis not present

## 2022-11-04 DIAGNOSIS — M533 Sacrococcygeal disorders, not elsewhere classified: Secondary | ICD-10-CM | POA: Diagnosis not present

## 2022-11-05 ENCOUNTER — Ambulatory Visit (HOSPITAL_BASED_OUTPATIENT_CLINIC_OR_DEPARTMENT_OTHER): Payer: Medicare Other | Admitting: Physical Therapy

## 2022-11-05 ENCOUNTER — Encounter (HOSPITAL_BASED_OUTPATIENT_CLINIC_OR_DEPARTMENT_OTHER): Payer: Self-pay | Admitting: Physical Therapy

## 2022-11-05 DIAGNOSIS — R262 Difficulty in walking, not elsewhere classified: Secondary | ICD-10-CM | POA: Diagnosis not present

## 2022-11-05 DIAGNOSIS — M5459 Other low back pain: Secondary | ICD-10-CM

## 2022-11-05 DIAGNOSIS — R252 Cramp and spasm: Secondary | ICD-10-CM

## 2022-11-05 DIAGNOSIS — M6281 Muscle weakness (generalized): Secondary | ICD-10-CM

## 2022-11-05 NOTE — Therapy (Signed)
OUTPATIENT PHYSICAL THERAPY THORACOLUMBAR Treatment /progress note   Patient Name: Dawn [name] MRN: [id number] DOB:[birth year], 77 y.o., female Today's Date: 10/28/2022  END OF SESSION:  PT End of Session - 10/28/22 1114     Visit Number 12    Number of Visits 16    Date for PT Re-Evaluation 11/24/22    Authorization Type progress note at 20    PT Start Time 1015    PT Stop Time 1058    PT Time Calculation (min) 43 min    Activity Tolerance Patient tolerated treatment well    Behavior During Therapy [facility] for tasks assessed/performed              Past Medical History:  Diagnosis Date   Allergy    CAD (coronary artery disease)    s/p DES to RCA in 06/2021   Dry eyes    Endometrial polyp    Essential hypertension 08/25/2018   GERD (gastroesophageal reflux disease)    History of hiatal hernia    History of kidney stones    Hot flashes, menopausal 10/13/2011   Estradiol started    Hyperlipidemia    Jaundice as teenager   no problems since   Memory loss 09/21/2019     1/2 of feet numb all the time   Multiple sclerosis dx 2001   Neuropathy    bilateral feet   Osteoarthritis    Sjogren's syndrome dx oct 2021   sore muscvles, dry mouth and eyes   Stroke    Vertigo    Vision abnormalities    Past Surgical History:  Procedure Laterality Date   ABDOMINAL HYSTERECTOMY  2002   partial   COLONOSCOPY  01/27/2022   2 day prep   colonscopy  2011   CORONARY STENT INTERVENTION N/A 07/18/2021   Procedure: CORONARY STENT INTERVENTION;  Surgeon: Corky Crafts, MD;  Location: [facility];  Service: Cardiovascular;  Laterality: N/A;   DILATATION & CURETTAGE/HYSTEROSCOPY WITH MYOSURE N/A 06/08/2020   Procedure: DILATATION & CURETTAGE/HYSTEROSCOPY/Polypectomy WITH MYOSURE;  Surgeon: Toy Baker, DO;  Location: [facility];  Service: Gynecology;  Laterality: N/A;   LEFT HEART CATH AND CORONARY ANGIOGRAPHY N/A 07/18/2021   Procedure: LEFT  HEART CATH AND CORONARY ANGIOGRAPHY;  Surgeon: Corky Crafts, MD;  Location: [facility];  Service: Cardiovascular;  Laterality: N/A;   LUMBAR FUSION  2001   TOTAL HIP ARTHROPLASTY Right 01/17/2021   Procedure: TOTAL HIP ARTHROPLASTY ANTERIOR APPROACH;  Surgeon: Samson Frederic, MD;  Location: [facility];  Service: Orthopedics;  Laterality: Right;   UPPER GI ENDOSCOPY  yrs ago   Patient Active Problem List   Diagnosis Date Noted   Positive colorectal cancer screening using Cologuard test 11/14/2021   Coronary artery disease    Statin intolerance 04/19/2021   Osteoarthritis of right hip 01/17/2021   Status post THR (total hip replacement) 01/17/2021   Chronic bilateral low back pain without sciatica 10/02/2020   Chronic pain syndrome 06/20/2020   Post laminectomy syndrome 06/20/2020   Sjogren's disease 05/23/2020   Primary osteoarthritis of both hands 05/23/2020   Primary osteoarthritis of both feet 05/23/2020   Other secondary scoliosis, lumbar region 12/22/2019   Spondylosis without myelopathy or radiculopathy, lumbar region 12/22/2019   Cervical radiculopathy 10/18/2019   Numbness 09/21/2019   Bilateral carpal tunnel syndrome 09/21/2019   High risk medication use 09/21/2019   Memory loss 09/21/2019   Essential hypertension 08/25/2018   Abnormal SPEP 02/19/2018   Hand  pain 02/20/2017   Multiple joint pain 02/18/2017   Disturbed cognition 06/25/2016   Sciatica, right side 10/30/2015   Chronic fatigue 08/04/2014   Gait disturbance 08/04/2014   Unspecified visual disturbance 01/31/2013   Transient vision disturbance 01/31/2013   Colon polyps 11/03/2011   POSTHERPETIC NEURALGIA 10/03/2009   DISPLCMT LUMBAR INTERVERT DISC W/O MYELOPATHY 09/05/2009   RENAL CALCULUS, RECURRENT 09/04/2009   Hyperlipidemia 08/31/2009   Multiple sclerosis (HCC) 08/31/2009   ALLERGIC RHINITIS 08/31/2009  Progress Note Reporting Period 08/04/2022 to 10/14/2022  See note below for Objective  Data and Assessment of Progress/Goals.     [name] Kitchen     PCP: Iantha Fallen NP  REFERRING PROVIDER: Dr Romero Belling   REFERRING DIAG: Low Back Pain   Rationale for Evaluation and Treatment: Rehabilitation  THERAPY DIAG:  Difficulty in walking, not elsewhere classified  Cramp and spasm  Other low back pain  Muscle weakness (generalized)  ONSET DATE: About 11 days ago her nerve abiliation wore off.   SUBJECTIVE:                                                                                                                                                                                           SUBJECTIVE STATEMENT: The patient had an injection in her hip which did not help much. She reports the pain does well for a few days then increase. Her back has done well since the injections.  PERTINENT HISTORY:  Right Hip replacement 2022, Low back Pain, MS, TIA October 2023; MI, November 2022; CAD, Sjorgens syndrome  PAIN:  Are you having pain? Yes: NPRS scale: 2/10 Pain location: right hip   Pain description: aching  Aggravating factors: standing and walking  Relieving factors: rest   Yes: NPRS scale: 4/10 right now Pain location: aching  Pain description: burning and aching  Aggravating factors: standing and walking  Relieving factors: rest   PRECAUTIONS: None  WEIGHT BEARING RESTRICTIONS: No  FALLS:  Has patient fallen in last 6 months? 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   LUMBAR ROM:   AROM eval  Flexion Limited 50 % with pain   Extension No limit   Right lateral flexion   Left lateral flexion   Right rotation Limited 50% with pain   Left rotation Limited 50% with pain    (Blank rows = not tested)  LOWER EXTREMITY ROM:     Passive  Right eval Left eval Right   Hip flexion 105 90 105  Hip extension   10 degrees from neutral before manual today. Too neutral after stretching   Hip abduction     Hip adduction     Hip internal rotation painful Painful  Painful   Hip external rotation   20 degrees with pain   Knee flexion     Knee extension     Ankle dorsiflexion     Ankle plantarflexion     Ankle inversion     Ankle eversion      (Blank rows = not tested)  LOWER EXTREMITY MMT:    MMT Right eval Left eval Right  3/26 Left  3/26  Hip flexion 13.1 12.3 15.1 26.1  Hip extension      Hip abduction 11.5 10.1 13.7 24.4  Hip adduction      Hip internal rotation      Hip external rotation      Knee flexion      Knee extension 14.0 13.3 17.5 33.3  Ankle dorsiflexion      Ankle plantarflexion      Ankle inversion      Ankle eversion       (Blank rows = not tested)  FUNCTIONAL TESTS:  5 times sit to stand: 28 sec   GAIT:  TODAY'S TREATMENT:                                                                                                                              DATE:  4/17 Manual: Side-lying hip flexor stretch, roller to right lateral leg, roll into the gluteal, trigger point release to anterior hip.  Nu-step 5 min    LTR x 20  LAQ 3x15 red  Hip abduction 2x10 red   Standing weight shift x20  Standing low march 3x10    Standing anterior hip stretch   4/9 Manual: Side-lying hip flexor stretch, roller to right lateral leg, roll  into the gluteal, trigger point release to anterior hip.  Nu-step 5 min    LTR x 20 Posterior pelvic tilt x 20  LAQ 2x10 red  Hip abduction 2x10 red   Standing weight shift x20     4/5 Manual: Side-lying hip flexor stretch, roller to right lateral leg, roll into the gluteal, trigger point release to anterior hip.  LTR x 20 Posterior pelvic tilt x 20  LAQ 2x10 red  Hip abduction 2x10 red  Seated March 2x10 red  Standing weight shift x20      3/26 Manual: Side-lying hip flexor stretch, roller to right lateral leg, roll into the gluteal, trigger point release to anterior hip.  LTR x 20 Posterior pelvic tilt x 20  Ball roll out forward 5 x 5-second hold Lateral 5 x 5 seconds  3/20 Trigger point release to lower lumbar spine; Trigger point release to right hip; roller to lateral and anterior hip.  LTR x20  Supine hip abduction green x20  Supine PPT x20  LAQ x20   Standing weight shift x20   Nu-step 5 min L5      3/14 Supine hip abduction 2x20 red Bridge 2x10  LTR x15   Standing weight shift x20  Standing slow march 2x10   trigger point release to lower lumbar spine; Trigger point release to both hips; roller to lateral hip and gluteal.    PATIENT EDUCATION:  Education details: reviewed HEP, symptom management  Person educated: Patient Education method: Explanation, Demonstration, Tactile cues, Verbal cues, and Handouts Education comprehension: verbalized understanding, returned demonstration, verbal cues required, tactile cues required, and needs further education  HOME EXERCISE PROGRAM: Has previous HEP. Will review next visit  ASSESSMENT:  CLINICAL IMPRESSION:  The patient came in lacking significant end range hip extension. She was unable to passively come to neutral. After manual therapy she did come to neutral. She reported feeling a significant improvement. We have her an anterior hip stretch in standing for home to maintain movement. She  continues to have significant trigger points in her anterior hip. We will progress as tolerated.    OBJECTIVE IMPAIRMENTS: Abnormal gait, decreased activity tolerance, decreased balance, decreased endurance, decreased knowledge of use of DME, decreased mobility, difficulty walking, decreased ROM, decreased strength, increased muscle spasms, and pain.   ACTIVITY LIMITATIONS: carrying, lifting, bending, standing, squatting, sleeping, stairs, transfers, and locomotion level  PARTICIPATION LIMITATIONS: meal prep, cleaning, laundry, driving, shopping, community activity, and yard work  PERSONAL FACTORS: Right Hip replacement 2022, Low back Pain, MS, TIA October 2023; MI, November 2022; CAD, Sjorgens syndrome are also affecting patient's functional outcome. MS  REHAB POTENTIAL: Good  CLINICAL DECISION MAKING: Evolving/moderate complexity declining mobility   EVALUATION COMPLEXITY: Moderate   GOALS: Goals reviewed with patient? No  SHORT TERM GOALS: Target date: 09/29/2022    Patient will increase gross bilateral lower extremity strength by 5 pounds Baseline: Goal status: INITIAL  2.  Patient will increase lumbar flexion by 10 degrees Baseline:  Goal status: INITIAL  3.  Patient will be independent with basic after exercise program and stretching program Baseline:  Goal status: INITIAL   LONG TERM GOALS: Target date: 09/29/2022     Patient will stand for greater than 30 minutes without increased pain Baseline:  Goal status: INITIAL  2.  Patient will pick item up off the floor without increased low back pain Baseline:  Goal status: INITIAL  3.  Patient will ambulate community distances without report of increased low back pain Baseline:  Goal status: INITIAL  4.  Patient will report improved ability to sleep through the night secondary to back pain Baseline:  Goal status: INITIAL    PLAN:  PT FREQUENCY: 1-2x/week  PT DURATION: 16 weeks   PLANNED INTERVENTIONS:  Therapeutic exercises, Therapeutic activity, Neuromuscular re-education, Balance training, Gait training, Patient/Family education, Self Care, Joint mobilization, Stair training, DME instructions, Aquatic Therapy, Dry Needling, Spinal mobilization, Cryotherapy, Moist heat, Ionotophoresis 4mg /ml Dexamethasone, and Manual therapy.  PLAN FOR NEXT SESSION: Consider manual therapy to  the back and hips, consider trigger point dry needling to back and hips.  Reviewed basic HEP.  Begin active strengthening of hips and core.  Begin gait training and functional mobility training.   Dessie Coma, PT 10/28/2022, 11:15 AM

## 2022-11-10 ENCOUNTER — Other Ambulatory Visit: Payer: Self-pay

## 2022-11-10 MED ORDER — PANTOPRAZOLE SODIUM 40 MG PO TBEC: DELAYED_RELEASE_TABLET | ORAL | 1 refills | Status: DC

## 2022-11-11 ENCOUNTER — Ambulatory Visit (HOSPITAL_BASED_OUTPATIENT_CLINIC_OR_DEPARTMENT_OTHER): Payer: Medicare Other | Admitting: Physical Therapy

## 2022-11-11 DIAGNOSIS — M6281 Muscle weakness (generalized): Secondary | ICD-10-CM | POA: Diagnosis not present

## 2022-11-11 DIAGNOSIS — R262 Difficulty in walking, not elsewhere classified: Secondary | ICD-10-CM | POA: Diagnosis not present

## 2022-11-11 DIAGNOSIS — M5459 Other low back pain: Secondary | ICD-10-CM

## 2022-11-11 DIAGNOSIS — R252 Cramp and spasm: Secondary | ICD-10-CM | POA: Diagnosis not present

## 2022-11-11 NOTE — Therapy (Signed)
OUTPATIENT PHYSICAL THERAPY THORACOLUMBAR Treatment    Patient Name: Dawn [name] MRN: [id number] DOB:[birth year], 77 y.o., female Today's Date: 10/28/2022  END OF SESSION:  PT End of Session - 10/28/22 1114     Visit Number 12    Number of Visits 16    Date for PT Re-Evaluation 11/24/22    Authorization Type progress note at 20    PT Start Time 1015    PT Stop Time 1058    PT Time Calculation (min) 43 min    Activity Tolerance Patient tolerated treatment well    Behavior During Therapy [facility] for tasks assessed/performed              Past Medical History:  Diagnosis Date   Allergy    CAD (coronary artery disease)    s/p DES to RCA in 06/2021   Dry eyes    Endometrial polyp    Essential hypertension 08/25/2018   GERD (gastroesophageal reflux disease)    History of hiatal hernia    History of kidney stones    Hot flashes, menopausal 10/13/2011   Estradiol started    Hyperlipidemia    Jaundice as teenager   no problems since   Memory loss 09/21/2019     1/2 of feet numb all the time   Multiple sclerosis dx 2001   Neuropathy    bilateral feet   Osteoarthritis    Sjogren's syndrome dx oct 2021   sore muscvles, dry mouth and eyes   Stroke    Vertigo    Vision abnormalities    Past Surgical History:  Procedure Laterality Date   ABDOMINAL HYSTERECTOMY  2002   partial   COLONOSCOPY  01/27/2022   2 day prep   colonscopy  2011   CORONARY STENT INTERVENTION N/A 07/18/2021   Procedure: CORONARY STENT INTERVENTION;  Surgeon: Corky Crafts, MD;  Location: [facility];  Service: Cardiovascular;  Laterality: N/A;   DILATATION & CURETTAGE/HYSTEROSCOPY WITH MYOSURE N/A 06/08/2020   Procedure: DILATATION & CURETTAGE/HYSTEROSCOPY/Polypectomy WITH MYOSURE;  Surgeon: Toy Baker, DO;  Location: [facility];  Service: Gynecology;  Laterality: N/A;   LEFT HEART CATH AND CORONARY ANGIOGRAPHY N/A 07/18/2021   Procedure: LEFT HEART CATH AND  CORONARY ANGIOGRAPHY;  Surgeon: Corky Crafts, MD;  Location: [facility];  Service: Cardiovascular;  Laterality: N/A;   LUMBAR FUSION  2001   TOTAL HIP ARTHROPLASTY Right 01/17/2021   Procedure: TOTAL HIP ARTHROPLASTY ANTERIOR APPROACH;  Surgeon: Samson Frederic, MD;  Location: [facility];  Service: Orthopedics;  Laterality: Right;   UPPER GI ENDOSCOPY  yrs ago   Patient Active Problem List   Diagnosis Date Noted   Positive colorectal cancer screening using Cologuard test 11/14/2021   Coronary artery disease    Statin intolerance 04/19/2021   Osteoarthritis of right hip 01/17/2021   Status post THR (total hip replacement) 01/17/2021   Chronic bilateral low back pain without sciatica 10/02/2020   Chronic pain syndrome 06/20/2020   Post laminectomy syndrome 06/20/2020   Sjogren's disease 05/23/2020   Primary osteoarthritis of both hands 05/23/2020   Primary osteoarthritis of both feet 05/23/2020   Other secondary scoliosis, lumbar region 12/22/2019   Spondylosis without myelopathy or radiculopathy, lumbar region 12/22/2019   Cervical radiculopathy 10/18/2019   Numbness 09/21/2019   Bilateral carpal tunnel syndrome 09/21/2019   High risk medication use 09/21/2019   Memory loss 09/21/2019   Essential hypertension 08/25/2018   Abnormal SPEP 02/19/2018   Hand pain  02/20/2017   Multiple joint pain 02/18/2017   Disturbed cognition 06/25/2016   Sciatica, right side 10/30/2015   Chronic fatigue 08/04/2014   Gait disturbance 08/04/2014   Unspecified visual disturbance 01/31/2013   Transient vision disturbance 01/31/2013   Colon polyps 11/03/2011   POSTHERPETIC NEURALGIA 10/03/2009   DISPLCMT LUMBAR INTERVERT DISC W/O MYELOPATHY 09/05/2009   RENAL CALCULUS, RECURRENT 09/04/2009   Hyperlipidemia 08/31/2009   Multiple sclerosis (HCC) 08/31/2009   ALLERGIC RHINITIS 08/31/2009  Progress Note Reporting Period 08/04/2022 to 10/14/2022  See note below for Objective Data and  Assessment of Progress/Goals.     [name] Kitchen     PCP: Iantha Fallen NP  REFERRING PROVIDER: Dr Romero Belling   REFERRING DIAG: Low Back Pain   Rationale for Evaluation and Treatment: Rehabilitation  THERAPY DIAG:  Difficulty in walking, not elsewhere classified  Cramp and spasm  Other low back pain  Muscle weakness (generalized)  ONSET DATE: About 11 days ago her nerve abiliation wore off.   SUBJECTIVE:                                                                                                                                                                                           SUBJECTIVE STATEMENT: Patient reports that she is feeling better and walker better as well. Patient reports she is able to walk a little further. She has not had any incidence of tightening recently.   PERTINENT HISTORY:  Right Hip replacement 2022, Low back Pain, MS, TIA October 2023; MI, November 2022; CAD, Sjorgens syndrome  PAIN:  Are you having pain? Yes: NPRS scale: 2/10 Pain location: right hip   Pain description: aching  Aggravating factors: standing and walking  Relieving factors: rest   Yes: NPRS scale: 4/10 right now Pain location: aching  Pain description: burning and aching  Aggravating factors: standing and walking  Relieving factors: rest   PRECAUTIONS: None  WEIGHT BEARING RESTRICTIONS: No  FALLS:  Has patient fallen in last 6 months? 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   LUMBAR ROM:   AROM eval  Flexion Limited 50 % with pain   Extension No limit   Right lateral flexion   Left lateral flexion   Right rotation Limited 50% with pain   Left rotation Limited 50% with pain    (Blank rows = not tested)  LOWER EXTREMITY ROM:     Passive  Right eval Left eval Right   Hip flexion 105 90 105  Hip extension   10 degrees from neutral before manual today. Too neutral after stretching   Hip abduction     Hip adduction     Hip internal rotation painful Painful  Painful   Hip external rotation   20 degrees with pain   Knee flexion     Knee extension     Ankle dorsiflexion     Ankle plantarflexion     Ankle inversion     Ankle eversion      (Blank rows = not tested)  LOWER EXTREMITY MMT:    MMT Right eval Left eval Right  3/26 Left  3/26  Hip flexion 13.1 12.3 15.1 26.1  Hip extension      Hip abduction 11.5 10.1 13.7 24.4  Hip adduction      Hip internal rotation      Hip external rotation      Knee flexion      Knee extension 14.0 13.3 17.5 33.3  Ankle dorsiflexion      Ankle plantarflexion      Ankle inversion      Ankle eversion       (Blank rows = not tested)  FUNCTIONAL TESTS:  5 times sit to stand: 28 sec   GAIT:  TODAY'S TREATMENT:                                                                                                                              DATE:   4/23 Nu step x2 mins on L2, x3 mins on L3 heart rate at entry is 65bpm. 71 post  Manual: Side-lying hip flexor stretch, roller to right lateral leg, roll into the gluteal, trigger point release to anterior hip.  Hip abduction 3x10 green Standing weight shift x20 Standing anterior hip stretch   Supine March 2x10   4/17 Manual: Side-lying hip flexor stretch, roller to  right lateral leg, roll into the gluteal, trigger point release to anterior hip.  Nu-step 5 min    LTR x 20  LAQ 3x15 red  Hip abduction 2x10 red   Standing weight shift x20  Standing low march 3x10    Standing anterior hip stretch   4/9 Manual: Side-lying hip flexor stretch, roller to right lateral leg, roll into the gluteal, trigger point release to anterior hip.  Nu-step 5 min    LTR x 20 Posterior pelvic tilt x 20  LAQ 2x10 red  Hip  abduction 2x10 red   Standing weight shift x20     4/5 Manual: Side-lying hip flexor stretch, roller to right lateral leg, roll into the gluteal, trigger point release to anterior hip.  LTR x 20 Posterior pelvic tilt x 20  LAQ 2x10 red  Hip abduction 2x10 red  Seated March 2x10 red   Standing weight shift x20      3/26 Manual: Side-lying hip flexor stretch, roller to right lateral leg, roll into the gluteal, trigger point release to anterior hip.  LTR x 20 Posterior pelvic tilt x 20  Ball roll out forward 5 x 5-second hold Lateral 5 x 5 seconds  3/20 Trigger point release to lower lumbar spine; Trigger point release to right hip; roller to lateral and anterior hip.  LTR x20  Supine hip abduction green x20  Supine PPT x20  LAQ x20   Standing weight shift x20   Nu-step 5 min L5      3/14 Supine hip abduction 2x20 red Bridge 2x10  LTR x15   Standing weight shift x20  Standing slow march 2x10   trigger point release to lower lumbar spine; Trigger point release to both hips; roller to lateral hip and gluteal.    PATIENT EDUCATION:  Education details: reviewed HEP, symptom management  Person educated: Patient Education method: Explanation, Demonstration, Tactile cues, Verbal cues, and Handouts Education comprehension: verbalized understanding, returned demonstration, verbal cues required, tactile cues required, and needs further education  HOME EXERCISE PROGRAM: Has previous HEP. Will review next  visit  ASSESSMENT:  CLINICAL IMPRESSION:  The patient continues to be limited in hip extension but improves with manual therapy. Rolling performed to the front of the hip helped her extension. We reviewed anterior hip stretching in standing. She tolerated there-ex well. We will progress as tolerated.   OBJECTIVE IMPAIRMENTS: Abnormal gait, decreased activity tolerance, decreased balance, decreased endurance, decreased knowledge of use of DME, decreased mobility, difficulty walking, decreased ROM, decreased strength, increased muscle spasms, and pain.   ACTIVITY LIMITATIONS: carrying, lifting, bending, standing, squatting, sleeping, stairs, transfers, and locomotion level  PARTICIPATION LIMITATIONS: meal prep, cleaning, laundry, driving, shopping, community activity, and yard work  PERSONAL FACTORS: Right Hip replacement 2022, Low back Pain, MS, TIA October 2023; MI, November 2022; CAD, Sjorgens syndrome are also affecting patient's functional outcome. MS  REHAB POTENTIAL: Good  CLINICAL DECISION MAKING: Evolving/moderate complexity declining mobility   EVALUATION COMPLEXITY: Moderate   GOALS: Goals reviewed with patient? No  SHORT TERM GOALS: Target date: 09/29/2022    Patient will increase gross bilateral lower extremity strength by 5 pounds Baseline: Goal status: INITIAL  2.  Patient will increase lumbar flexion by 10 degrees Baseline:  Goal status: INITIAL  3.  Patient will be independent with basic after exercise program and stretching program Baseline:  Goal status: INITIAL   LONG TERM GOALS: Target date: 09/29/2022     Patient will stand for greater than 30 minutes without increased pain Baseline:  Goal status: INITIAL  2.  Patient will pick item up off the floor without increased low back pain Baseline:  Goal status: INITIAL  3.  Patient will ambulate community distances without report of increased low back pain Baseline:  Goal status: INITIAL  4.   Patient will report improved ability to sleep through the night secondary to back pain Baseline:  Goal status: INITIAL    PLAN:  PT FREQUENCY: 1-2x/week  PT DURATION: 16 weeks   PLANNED INTERVENTIONS: Therapeutic exercises, Therapeutic activity, Neuromuscular re-education, Balance  training, Gait training, Patient/Family education, Self Care, Joint mobilization, Stair training, DME instructions, Aquatic Therapy, Dry Needling, Spinal mobilization, Cryotherapy, Moist heat, Ionotophoresis 4mg /ml Dexamethasone, and Manual therapy.  PLAN FOR NEXT SESSION: Consider manual therapy to the back and hips, consider trigger point dry needling to back and hips.  Reviewed basic HEP.  Begin active strengthening of hips and core.  Begin gait training and functional mobility training.   Dessie Coma, PT 10/28/2022, 11:15 AM   Cristal Ford 10/28/2022   I have reviewed and concur with this student's documentation.   During this treatment session, the therapist was present, participating in and directing the treatment.

## 2022-11-18 ENCOUNTER — Encounter (HOSPITAL_BASED_OUTPATIENT_CLINIC_OR_DEPARTMENT_OTHER): Payer: Self-pay | Admitting: Physical Therapy

## 2022-11-18 ENCOUNTER — Ambulatory Visit (HOSPITAL_BASED_OUTPATIENT_CLINIC_OR_DEPARTMENT_OTHER): Payer: Medicare Other | Admitting: Physical Therapy

## 2022-11-18 DIAGNOSIS — M6281 Muscle weakness (generalized): Secondary | ICD-10-CM | POA: Diagnosis not present

## 2022-11-18 DIAGNOSIS — R252 Cramp and spasm: Secondary | ICD-10-CM | POA: Diagnosis not present

## 2022-11-18 DIAGNOSIS — M5459 Other low back pain: Secondary | ICD-10-CM | POA: Diagnosis not present

## 2022-11-18 DIAGNOSIS — R262 Difficulty in walking, not elsewhere classified: Secondary | ICD-10-CM

## 2022-11-18 NOTE — Therapy (Signed)
OUTPATIENT PHYSICAL THERAPY THORACOLUMBAR Treatment    Patient Name: Dawn [name] MRN: [id number] DOB:1946/03/09, 77 y.o., female Today's Date: 10/28/2022  END OF SESSION:  PT End of Session - 10/28/22 1114     Visit Number 12    Number of Visits 16    Date for PT Re-Evaluation 11/24/22    Authorization Type progress note at 20    PT Start Time 1015    PT Stop Time 1058    PT Time Calculation (min) 43 min    Activity Tolerance Patient tolerated treatment well    Behavior During Therapy [facility] for tasks assessed/performed              Past Medical History:  Diagnosis Date   Allergy    CAD (coronary artery disease)    s/p DES to RCA in 06/2021   Dry eyes    Endometrial polyp    Essential hypertension 08/25/2018   GERD (gastroesophageal reflux disease)    History of hiatal hernia    History of kidney stones    Hot flashes, menopausal 10/13/2011   Estradiol started    Hyperlipidemia    Jaundice as teenager   no problems since   Memory loss 09/21/2019     1/2 of feet numb all the time   Multiple sclerosis dx 2001   Neuropathy    bilateral feet   Osteoarthritis    Sjogren's syndrome dx oct 2021   sore muscvles, dry mouth and eyes   Stroke    Vertigo    Vision abnormalities    Past Surgical History:  Procedure Laterality Date   ABDOMINAL HYSTERECTOMY  2002   partial   COLONOSCOPY  01/27/2022   2 day prep   colonscopy  2011   CORONARY STENT INTERVENTION N/A 07/18/2021   Procedure: CORONARY STENT INTERVENTION;  Surgeon: Corky Crafts, MD;  Location: [facility];  Service: Cardiovascular;  Laterality: N/A;   DILATATION & CURETTAGE/HYSTEROSCOPY WITH MYOSURE N/A 06/08/2020   Procedure: DILATATION & CURETTAGE/HYSTEROSCOPY/Polypectomy WITH MYOSURE;  Surgeon: Toy Baker, DO;  Location: [facility];  Service: Gynecology;  Laterality: N/A;   LEFT HEART CATH AND CORONARY ANGIOGRAPHY N/A 07/18/2021   Procedure: LEFT HEART CATH AND  CORONARY ANGIOGRAPHY;  Surgeon: Corky Crafts, MD;  Location: [facility]IVE CV [facility];  Service: Cardiovascular;  Laterality: N/A;   LUMBAR FUSION  2001   TOTAL HIP ARTHROPLASTY Right 01/17/2021   Procedure: TOTAL HIP ARTHROPLASTY ANTERIOR APPROACH;  Surgeon: Samson Frederic, MD;  Location: [facility];  Service: Orthopedics;  Laterality: Right;   UPPER GI ENDOSCOPY  yrs ago   Patient Active Problem List   Diagnosis Date Noted   Positive colorectal cancer screening using Cologuard test 11/14/2021   Coronary artery disease    Statin intolerance 04/19/2021   Osteoarthritis of right hip 01/17/2021   Status post THR (total hip replacement) 01/17/2021   Chronic bilateral low back pain without sciatica 10/02/2020   Chronic pain syndrome 06/20/2020   Post laminectomy syndrome 06/20/2020   Sjogren's disease 05/23/2020   Primary osteoarthritis of both hands 05/23/2020   Primary osteoarthritis of both feet 05/23/2020   Other secondary scoliosis, lumbar region 12/22/2019   Spondylosis without myelopathy or radiculopathy, lumbar region 12/22/2019   Cervical radiculopathy 10/18/2019   Numbness 09/21/2019   Bilateral carpal tunnel syndrome 09/21/2019   High risk medication use 09/21/2019   Memory loss 09/21/2019   Essential hypertension 08/25/2018   Abnormal SPEP 02/19/2018   Hand pain  02/20/2017   Multiple joint pain 02/18/2017   Disturbed cognition 06/25/2016   Sciatica, right side 10/30/2015   Chronic fatigue 08/04/2014   Gait disturbance 08/04/2014   Unspecified visual disturbance 01/31/2013   Transient vision disturbance 01/31/2013   Colon polyps 11/03/2011   POSTHERPETIC NEURALGIA 10/03/2009   DISPLCMT LUMBAR INTERVERT DISC W/O MYELOPATHY 09/05/2009   RENAL CALCULUS, RECURRENT 09/04/2009   Hyperlipidemia 08/31/2009   Multiple sclerosis (HCC) 08/31/2009   ALLERGIC RHINITIS 08/31/2009  Progress Note Reporting Period 08/04/2022 to 10/14/2022  See note below for Objective Data and  Assessment of Progress/Goals.     [name] Kitchen     PCP: Iantha Fallen NP  REFERRING PROVIDER: Dr Romero Belling   REFERRING DIAG: Low Back Pain   Rationale for Evaluation and Treatment: Rehabilitation  THERAPY DIAG:  Difficulty in walking, not elsewhere classified  Cramp and spasm  Other low back pain  Muscle weakness (generalized)  ONSET DATE: About 11 days ago her nerve abiliation wore off.   SUBJECTIVE:                                                                                                                                                                                           SUBJECTIVE STATEMENT: Patient reports that yesterday she had an acute exacerbation of right hip anterior and posterior pain.  She does not remember doing anything to cause the pain.  She reports she is having pain standing and walking.  Prior to yesterday she had done fairly well with her pain levels.  PERTINENT HISTORY:  Right Hip replacement 2022, Low back Pain, MS, TIA October 2023; MI, November 2022; CAD, Sjorgens syndrome  PAIN:  Are you having pain? Yes: NPRS scale: 2/10 Pain location: right hip   Pain description: aching  Aggravating factors: standing and walking  Relieving factors: rest   Yes: NPRS scale: 4/10 right now Pain location: aching  Pain description: burning and aching  Aggravating factors: standing and walking  Relieving factors: rest   PRECAUTIONS: None  WEIGHT BEARING RESTRICTIONS: No  FALLS:  Has patient fallen in last 6 months? 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   LUMBAR ROM:   AROM eval  Flexion Limited 50 % with pain   Extension No limit   Right lateral flexion   Left lateral flexion   Right rotation Limited 50% with pain   Left rotation Limited 50% with pain    (Blank rows = not tested)  LOWER EXTREMITY ROM:     Passive  Right eval Left eval Right   Hip flexion 105 90 105  Hip extension   10 degrees from neutral before manual today. Too neutral after stretching   Hip abduction     Hip adduction     Hip internal rotation painful Painful  Painful   Hip external rotation   20 degrees with pain   Knee flexion     Knee extension     Ankle dorsiflexion     Ankle plantarflexion     Ankle inversion     Ankle eversion      (Blank rows = not tested)  LOWER EXTREMITY MMT:    MMT Right eval Left eval Right  3/26 Left  3/26  Hip flexion 13.1 12.3 15.1 26.1  Hip extension      Hip abduction 11.5 10.1 13.7 24.4  Hip adduction      Hip internal rotation      Hip external rotation      Knee flexion      Knee extension 14.0 13.3 17.5 33.3  Ankle dorsiflexion      Ankle plantarflexion      Ankle inversion      Ankle eversion       (Blank rows = not tested)  FUNCTIONAL TESTS:  5 times sit to stand: 28 sec   GAIT:  TODAY'S TREATMENT:                                                                                                                              DATE:  4/30 Trigger Point Dry-Needling  Treatment instructions: Expect mild to moderate muscle soreness. S/S of pneumothorax if dry needled over a lung field, and to seek immediate medical attention should they occur. Patient verbalized understanding of these instructions and education.  Patient  Consent Given: Yes Education handout provided: Yes Muscles treated: Right gluteal 3 spots using 0.30 X75 needle Treatment response/outcome: Good twitch response; immediate pain relief.   NuStep level 3 5 minutes  Manual: Roller to lateral hip, roller to anterior hip, trigger point release to bilateral gluteal, trigger point release to lumbar spine. Reviewed HEP to do over the next couple days including self trigger point release with tennis ball to gluteal over area that was needled 4/23 Nu step x2 mins on L2, x3 mins on L3 heart rate at entry is 65bpm. 71 post  Manual: Side-lying hip flexor stretch,  roller to right lateral leg, roll into the gluteal, trigger point release to anterior hip.  Hip abduction 3x10 green Standing weight shift x20 Standing anterior hip stretch   Supine March 2x10   4/17 Manual: Side-lying hip flexor stretch, roller to right lateral leg, roll into the gluteal, trigger point release to anterior hip.  Nu-step 5 min    LTR x 20  LAQ 3x15 red  Hip abduction 2x10 red   Standing weight shift x20  Standing low march 3x10    Standing anterior hip stretch   4/9 Manual: Side-lying hip flexor stretch, roller to right lateral leg, roll into the gluteal, trigger point release to anterior hip.  Nu-step 5 min    LTR x 20 Posterior pelvic tilt x 20  LAQ 2x10 red  Hip abduction 2x10 red   Standing weight shift x20     4/5 Manual: Side-lying hip flexor stretch, roller to right lateral leg, roll into the gluteal, trigger point release to anterior hip.  LTR x 20 Posterior pelvic tilt x 20  LAQ 2x10 red  Hip abduction 2x10 red  Seated March 2x10 red   Standing weight shift x20       PATIENT EDUCATION:  Education details: reviewed HEP, symptom management  Person educated: Patient Education method: Explanation, Demonstration, Tactile cues, Verbal cues, and Handouts Education comprehension: verbalized understanding, returned demonstration,  verbal cues required, tactile cues required, and needs further education  HOME EXERCISE PROGRAM: Has previous HEP. Will review next visit  ASSESSMENT:  CLINICAL IMPRESSION: The patient is more limited today.  She had increased trigger points in bilateral gluteals and lower lumbar spine.  Therapy performed needling to this area.  She reported a immediate relief in pain.  Therapy focused on manual therapy today to reduce pain levels.  She had less of an antalgic gait leaving the treatment.  We reviewed what exercises to do over the next few days.  Next visit we will perform a progress note.    OBJECTIVE IMPAIRMENTS: Abnormal gait, decreased activity tolerance, decreased balance, decreased endurance, decreased knowledge of use of DME, decreased mobility, difficulty walking, decreased ROM, decreased strength, increased muscle spasms, and pain.   ACTIVITY LIMITATIONS: carrying, lifting, bending, standing, squatting, sleeping, stairs, transfers, and locomotion level  PARTICIPATION LIMITATIONS: meal prep, cleaning, laundry, driving, shopping, community activity, and yard work  PERSONAL FACTORS: Right Hip replacement 2022, Low back Pain, MS, TIA October 2023; MI, November 2022; CAD, Sjorgens syndrome are also affecting patient's functional outcome. MS  REHAB POTENTIAL: Good  CLINICAL DECISION MAKING: Evolving/moderate complexity declining mobility   EVALUATION COMPLEXITY: Moderate   GOALS: Goals reviewed with patient? No  SHORT TERM GOALS: Target date: 09/29/2022    Patient will increase gross bilateral lower extremity strength by 5 pounds Baseline: Goal status: INITIAL  2.  Patient will increase lumbar flexion by 10 degrees Baseline:  Goal status: INITIAL  3.  Patient will be independent with basic after exercise program and stretching program Baseline:  Goal status: INITIAL   LONG TERM GOALS: Target date: 09/29/2022     Patient will stand for greater than 30 minutes without  increased pain Baseline:  Goal status: INITIAL  2.  Patient will pick item up off the floor without increased low back pain Baseline:  Goal status: INITIAL  3.  Patient will ambulate community distances without report of increased low back pain Baseline:  Goal status: INITIAL  4.  Patient will report improved ability to sleep through the night secondary to back  pain Baseline:  Goal status: INITIAL    PLAN:  PT FREQUENCY: 1-2x/week  PT DURATION: 16 weeks   PLANNED INTERVENTIONS: Therapeutic exercises, Therapeutic activity, Neuromuscular re-education, Balance training, Gait training, Patient/Family education, Self Care, Joint mobilization, Stair training, DME instructions, Aquatic Therapy, Dry Needling, Spinal mobilization, Cryotherapy, Moist heat, Ionotophoresis 4mg /ml Dexamethasone, and Manual therapy.  PLAN FOR NEXT SESSION: Consider manual therapy to the back and hips, consider trigger point dry needling to back and hips.  Reviewed basic HEP.  Begin active strengthening of hips and core.  Begin gait training and functional mobility training.   Dessie Coma, PT 10/28/2022, 11:15 AM

## 2022-11-20 NOTE — Telephone Encounter (Signed)
Ok per Dr [place]  

## 2022-11-21 ENCOUNTER — Telehealth: Payer: Self-pay | Admitting: Internal Medicine

## 2022-11-21 NOTE — Telephone Encounter (Signed)
Patient stated that she is changing to a provider closer to her and wanted to thank Dr. Wyline Mood and staff for their kindness.

## 2022-11-24 DIAGNOSIS — H00021 Hordeolum internum right upper eyelid: Secondary | ICD-10-CM | POA: Diagnosis not present

## 2022-11-25 ENCOUNTER — Encounter (HOSPITAL_BASED_OUTPATIENT_CLINIC_OR_DEPARTMENT_OTHER): Payer: Medicare Other | Admitting: Physical Therapy

## 2022-11-26 ENCOUNTER — Ambulatory Visit: Payer: Medicare Other | Admitting: Internal Medicine

## 2022-12-01 DIAGNOSIS — M533 Sacrococcygeal disorders, not elsewhere classified: Secondary | ICD-10-CM | POA: Diagnosis not present

## 2022-12-03 ENCOUNTER — Encounter (HOSPITAL_BASED_OUTPATIENT_CLINIC_OR_DEPARTMENT_OTHER): Payer: Self-pay | Admitting: Physical Therapy

## 2022-12-03 ENCOUNTER — Ambulatory Visit (HOSPITAL_BASED_OUTPATIENT_CLINIC_OR_DEPARTMENT_OTHER): Payer: Medicare Other | Attending: Physical Medicine and Rehabilitation | Admitting: Physical Therapy

## 2022-12-03 DIAGNOSIS — R262 Difficulty in walking, not elsewhere classified: Secondary | ICD-10-CM | POA: Insufficient documentation

## 2022-12-03 DIAGNOSIS — R252 Cramp and spasm: Secondary | ICD-10-CM | POA: Diagnosis not present

## 2022-12-03 DIAGNOSIS — M5459 Other low back pain: Secondary | ICD-10-CM | POA: Insufficient documentation

## 2022-12-03 DIAGNOSIS — M6281 Muscle weakness (generalized): Secondary | ICD-10-CM | POA: Diagnosis not present

## 2022-12-03 NOTE — Therapy (Signed)
OUTPATIENT PHYSICAL THERAPY THORACOLUMBAR Treatment    Patient Name: Dawn [name] MRN: [id number] DOB:1946/03/09, 77 y.o., female Today's Date: 10/28/2022  END OF SESSION:  PT End of Session - 10/28/22 1114     Visit Number 12    Number of Visits 16    Date for PT Re-Evaluation 11/24/22    Authorization Type progress note at 20    PT Start Time 1015    PT Stop Time 1058    PT Time Calculation (min) 43 min    Activity Tolerance Patient tolerated treatment well    Behavior During Therapy [facility] for tasks assessed/performed              Past Medical History:  Diagnosis Date   Allergy    CAD (coronary artery disease)    s/p DES to RCA in 06/2021   Dry eyes    Endometrial polyp    Essential hypertension 08/25/2018   GERD (gastroesophageal reflux disease)    History of hiatal hernia    History of kidney stones    Hot flashes, menopausal 10/13/2011   Estradiol started    Hyperlipidemia    Jaundice as teenager   no problems since   Memory loss 09/21/2019     1/2 of feet numb all the time   Multiple sclerosis dx 2001   Neuropathy    bilateral feet   Osteoarthritis    Sjogren's syndrome dx oct 2021   sore muscvles, dry mouth and eyes   Stroke    Vertigo    Vision abnormalities    Past Surgical History:  Procedure Laterality Date   ABDOMINAL HYSTERECTOMY  2002   partial   COLONOSCOPY  01/27/2022   2 day prep   colonscopy  2011   CORONARY STENT INTERVENTION N/A 07/18/2021   Procedure: CORONARY STENT INTERVENTION;  Surgeon: Corky Crafts, MD;  Location: [facility];  Service: Cardiovascular;  Laterality: N/A;   DILATATION & CURETTAGE/HYSTEROSCOPY WITH MYOSURE N/A 06/08/2020   Procedure: DILATATION & CURETTAGE/HYSTEROSCOPY/Polypectomy WITH MYOSURE;  Surgeon: Toy Baker, DO;  Location: [facility];  Service: Gynecology;  Laterality: N/A;   LEFT HEART CATH AND CORONARY ANGIOGRAPHY N/A 07/18/2021   Procedure: LEFT HEART CATH AND  CORONARY ANGIOGRAPHY;  Surgeon: Corky Crafts, MD;  Location: [facility];  Service: Cardiovascular;  Laterality: N/A;   LUMBAR FUSION  2001   TOTAL HIP ARTHROPLASTY Right 01/17/2021   Procedure: TOTAL HIP ARTHROPLASTY ANTERIOR APPROACH;  Surgeon: Samson Frederic, MD;  Location: [facility];  Service: Orthopedics;  Laterality: Right;   UPPER GI ENDOSCOPY  yrs ago   Patient Active Problem List   Diagnosis Date Noted   Positive colorectal cancer screening using Cologuard test 11/14/2021   Coronary artery disease    Statin intolerance 04/19/2021   Osteoarthritis of right hip 01/17/2021   Status post THR (total hip replacement) 01/17/2021   Chronic bilateral low back pain without sciatica 10/02/2020   Chronic pain syndrome 06/20/2020   Post laminectomy syndrome 06/20/2020   Sjogren's disease 05/23/2020   Primary osteoarthritis of both hands 05/23/2020   Primary osteoarthritis of both feet 05/23/2020   Other secondary scoliosis, lumbar region 12/22/2019   Spondylosis without myelopathy or radiculopathy, lumbar region 12/22/2019   Cervical radiculopathy 10/18/2019   Numbness 09/21/2019   Bilateral carpal tunnel syndrome 09/21/2019   High risk medication use 09/21/2019   Memory loss 09/21/2019   Essential hypertension 08/25/2018   Abnormal SPEP 02/19/2018   Hand pain  02/20/2017   Multiple joint pain 02/18/2017   Disturbed cognition 06/25/2016   Sciatica, right side 10/30/2015   Chronic fatigue 08/04/2014   Gait disturbance 08/04/2014   Unspecified visual disturbance 01/31/2013   Transient vision disturbance 01/31/2013   Colon polyps 11/03/2011   POSTHERPETIC NEURALGIA 10/03/2009   DISPLCMT LUMBAR INTERVERT DISC W/O MYELOPATHY 09/05/2009   RENAL CALCULUS, RECURRENT 09/04/2009   Hyperlipidemia 08/31/2009   Multiple sclerosis (HCC) 08/31/2009   ALLERGIC RHINITIS 08/31/2009  Progress Note Reporting Period 10/14/2022 to 12/03/2022  See note below for Objective Data and  Assessment of Progress/Goals.     [name] Kitchen     PCP: Iantha Fallen NP  REFERRING PROVIDER: Dr Romero Belling   REFERRING DIAG: Low Back Pain   Rationale for Evaluation and Treatment: Rehabilitation  THERAPY DIAG:  Difficulty in walking, not elsewhere classified  Cramp and spasm  Other low back pain  Muscle weakness (generalized)  ONSET DATE: About 11 days ago her nerve abiliation wore off.   SUBJECTIVE:                                                                                                                                                                                           SUBJECTIVE STATEMENT: The patient reports significant improvement in pain following needling session last visit and soft tissue mobilization and with the injections she has been receiving. .  She is seen Dr Maurice Small who advised her to continue with PT.[name] Kitchen  She feels like overall she is able to stand for longer period of time since the reduction of her acute onset of pain.  She feels like she is walking better in the community. PERTINENT HISTORY:  Right Hip replacement 2022, Low back Pain, MS, TIA October 2023; MI, November 2022; CAD, Sjorgens syndrome  PAIN:  Are you having pain? Yes: NPRS scale: 2/10 Pain location: right hip  /lumbar spine Pain description: aching  Aggravating factors: standing and walking  Relieving factors: rest    PRECAUTIONS: None  WEIGHT BEARING RESTRICTIONS: No  FALLS:  Has patient fallen in last 6 months? 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   LUMBAR ROM:   AROM eval 5/15  Flexion Limited 50 % with pain  Mild improvement but still about 50%   Extension No limit    Right lateral flexion    Left lateral flexion    Right rotation Limited 50% with pain    Left rotation Limited 50% with pain     (Blank rows = not tested)  LOWER EXTREMITY ROM:     Passive  Right eval Left eval Right   Hip flexion 105 90 105  Hip extension   10 degrees from neutral before manual today. Too neutral after stretching   Hip abduction     Hip adduction     Hip internal rotation painful Painful  Painful   Hip external rotation   20 degrees with pain   Knee flexion     Knee extension     Ankle dorsiflexion     Ankle plantarflexion     Ankle inversion     Ankle eversion      (Blank rows = not tested)  LOWER EXTREMITY MMT:    MMT Right eval Left eval Right  3/26 Left  3/26 Right 5/15 Left 5/15  Hip flexion 13.1 12.3 15.1 26.1 18.2 26.2  Hip extension        Hip abduction 11.5 10.1 13.7 24.4 15.9 24  Hip adduction        Hip internal rotation        Hip external rotation        Knee flexion        Knee extension 14.0 13.3 17.5 33.3 20 38.2  Ankle dorsiflexion        Ankle plantarflexion        Ankle inversion        Ankle eversion         (Blank rows = not tested)  FUNCTIONAL TESTS:  5 times sit to stand: 28 sec  5/15 5 times sit to stand test 20 seconds  3-minute walk test 150 feet without requirement of seated rest break 5/15  GAIT:  TODAY'S TREATMENT:                                                                                                                              DATE:  5/15 Trigger Point Dry-Needling  Treatment instructions: Expect mild  to moderate muscle soreness. S/S of pneumothorax if dry needled over a lung field, and to seek immediate medical attention should they occur. Patient verbalized understanding of these instructions and education.  Patient Consent Given: Yes Education handout provided: Yes Muscles treated: Right gluteal 3 spots using 0.30 X75 needle left gluteal 2 spots using a 0.30 x 75 needle Treatment response/outcome: Good twitch response; immediate pain relief.   NuStep level 3 5 minutes  Manual: Roller to  lateral hip, roller to anterior hip, trigger point release to bilateral gluteal, trigger point release to lumbar spine.  LTR x 20 Supine march 2 x 20 Hip abduction green 2 x 20 Long arc quad red 2 x 20 each leg Standing heel raise/weight shift x 20   4/30 Trigger Point Dry-Needling  Treatment instructions: Expect mild to moderate muscle soreness. S/S of pneumothorax if dry needled over a lung field, and to seek immediate medical attention should they occur. Patient verbalized understanding of these instructions and education.  Patient Consent Given: Yes Education handout provided: Yes Muscles treated: Right gluteal 3 spots using 0.30 X75 needle Treatment response/outcome: Good twitch response; immediate pain relief.   NuStep level 3 5 minutes  Manual: Roller to lateral hip, roller to anterior hip, trigger point release to bilateral gluteal, trigger point release to lumbar spine. Reviewed HEP to do over the next couple days including self trigger point release with tennis ball to gluteal over area that was needled 4/23 Nu step x2 mins on L2, x3 mins on L3 heart rate at entry is 65bpm. 71 post  Manual: Side-lying hip flexor stretch, roller to right lateral leg, roll into the gluteal, trigger point release to anterior hip.  Hip abduction 3x10 green Standing weight shift x20 Standing anterior hip stretch   Supine March 2x10    PATIENT EDUCATION:  Education details: reviewed HEP, symptom  management  Person educated: Patient Education method: Explanation, Demonstration, Tactile cues, Verbal cues, and Handouts Education comprehension: verbalized understanding, returned demonstration, verbal cues required, tactile cues required, and needs further education  HOME EXERCISE PROGRAM: Has previous HEP. Will review next visit  ASSESSMENT:  CLINICAL IMPRESSION: The patient has progressed well since recent exacerbation of symptoms into the right lower lumbar spine and radiating into the right anterior hip.  That pain is resolved.  She continues to have pain but is not as severe.  She reports overall her ability to stand and walk has improved over the past week.  Less pain with her ADLs.  She has returned to the pool and continues doing home exercises at home.  Despite recent exacerbation of pain her right lower extremity strength has improved.  Her sit to stand time test is also improved.  We performed a standing walk test today.  Will continue to monitor her endurance 6 and 3-minute walk test.  She would benefit from further skilled therapy 1W10.  Will continue to progress her ambulation distance/endurance as well as her standing time to improve her ability to perform ADLs.   OBJECTIVE IMPAIRMENTS: Abnormal gait, decreased activity tolerance, decreased balance, decreased endurance, decreased knowledge of use of DME, decreased mobility, difficulty walking, decreased ROM, decreased strength, increased muscle spasms, and pain.   ACTIVITY LIMITATIONS: carrying, lifting, bending, standing, squatting, sleeping, stairs, transfers, and locomotion level  PARTICIPATION LIMITATIONS: meal prep, cleaning, laundry, driving, shopping, community activity, and yard work  PERSONAL FACTORS: Right Hip replacement 2022, Low back Pain, MS, TIA October 2023; MI, November 2022; CAD, Sjorgens syndrome are also affecting patient's functional outcome. MS  REHAB POTENTIAL: Good  CLINICAL DECISION MAKING:  Evolving/moderate complexity declining mobility   EVALUATION COMPLEXITY: Moderate   GOALS: Goals reviewed with patient? No  SHORT TERM GOALS: Target date: 09/29/2022    Patient will increase gross bilateral lower extremity strength by 5 pounds Baseline: Goal status: progressing 5/15  2.  Patient will increase lumbar flexion by 10 degrees Baseline:  Goal status:  mild improvement but still painful   3.  Patient will be independent with basic after exercise program and stretching program Baseline:  Goal status: has basic HEP    LONG TERM GOALS: Target date: 09/29/2022     Patient will stand for greater than 30 minutes without increased pain Baseline:  Goal status: improving 5/15   2.  Patient will pick item up off the floor without increased low back pain Baseline:  Goal status:still having pain 5/15  3.  Patient will ambulate community distances without report of increased low back pain Baseline:  Goal status: INITIAL  4.  Patient will report improved ability to sleep through the night secondary to back pain Baseline:  Goal status: INITIAL    PLAN:  PT FREQUENCY: 1-2x/week  PT DURATION: 16 weeks   PLANNED INTERVENTIONS: Therapeutic exercises, Therapeutic activity, Neuromuscular re-education, Balance training, Gait training, Patient/Family education, Self Care, Joint mobilization, Stair training, DME instructions, Aquatic Therapy, Dry Needling, Spinal mobilization, Cryotherapy, Moist heat, Ionotophoresis 4mg /ml Dexamethasone, and Manual therapy.  PLAN FOR NEXT SESSION: Consider manual therapy to the back and hips, consider trigger point dry needling to back and hips.  Reviewed basic HEP.  Begin active strengthening of hips and core.  Begin gait training and functional mobility training.   Dessie Coma, PT 10/28/2022, 11:15 AM

## 2022-12-10 ENCOUNTER — Encounter (HOSPITAL_BASED_OUTPATIENT_CLINIC_OR_DEPARTMENT_OTHER): Payer: Self-pay | Admitting: Physical Therapy

## 2022-12-10 ENCOUNTER — Ambulatory Visit (HOSPITAL_BASED_OUTPATIENT_CLINIC_OR_DEPARTMENT_OTHER): Payer: Medicare Other | Admitting: Physical Therapy

## 2022-12-10 DIAGNOSIS — M6281 Muscle weakness (generalized): Secondary | ICD-10-CM | POA: Diagnosis not present

## 2022-12-10 DIAGNOSIS — R262 Difficulty in walking, not elsewhere classified: Secondary | ICD-10-CM

## 2022-12-10 DIAGNOSIS — M5459 Other low back pain: Secondary | ICD-10-CM | POA: Diagnosis not present

## 2022-12-10 DIAGNOSIS — R252 Cramp and spasm: Secondary | ICD-10-CM

## 2022-12-10 NOTE — Therapy (Unsigned)
OUTPATIENT PHYSICAL THERAPY THORACOLUMBAR Treatment    Patient Name: Dawn [name] MRN: [id number] DOB:05-Aug-1945, 77 y.o., female Today's Date: 12/11/2022  END OF SESSION:  PT End of Session - 12/10/22 1513     Visit Number 17    Number of Visits 26    Date for PT Re-Evaluation 02/11/23    Authorization Type progress note done at visit 16 next at 26    PT Start Time 1145    PT Stop Time 1227    PT Time Calculation (min) 42 min    Activity Tolerance Patient tolerated treatment well    Behavior During Therapy [facility] for tasks assessed/performed              Past Medical History:  Diagnosis Date   Allergy    CAD (coronary artery disease)    s/p DES to RCA in 06/2021   Dry eyes    Endometrial polyp    Essential hypertension 08/25/2018   GERD (gastroesophageal reflux disease)    History of hiatal hernia    History of kidney stones    Hot flashes, menopausal 10/13/2011   Estradiol started    Hyperlipidemia    Jaundice as teenager   no problems since   Memory loss 09/21/2019     1/2 of feet numb all the time   Multiple sclerosis (HCC) dx 2001   Neuropathy    bilateral feet   Osteoarthritis    Sjogren's syndrome (HCC) dx oct 2021   sore muscvles, dry mouth and eyes   Stroke (HCC)    Vertigo    Vision abnormalities    Past Surgical History:  Procedure Laterality Date   ABDOMINAL HYSTERECTOMY  2002   partial   COLONOSCOPY  01/27/2022   2 day prep   colonscopy  2011   CORONARY STENT INTERVENTION N/A 07/18/2021   Procedure: CORONARY STENT INTERVENTION;  Surgeon: Corky Crafts, MD;  Location: [facility];  Service: Cardiovascular;  Laterality: N/A;   DILATATION & CURETTAGE/HYSTEROSCOPY WITH MYOSURE N/A 06/08/2020   Procedure: DILATATION & CURETTAGE/HYSTEROSCOPY/Polypectomy WITH MYOSURE;  Surgeon: Toy Baker, DO;  Location: [facility];  Service: Gynecology;  Laterality: N/A;   LEFT HEART CATH AND CORONARY ANGIOGRAPHY N/A  07/18/2021   Procedure: LEFT HEART CATH AND CORONARY ANGIOGRAPHY;  Surgeon: Corky Crafts, MD;  Location: [facility];  Service: Cardiovascular;  Laterality: N/A;   LUMBAR FUSION  2001   TOTAL HIP ARTHROPLASTY Right 01/17/2021   Procedure: TOTAL HIP ARTHROPLASTY ANTERIOR APPROACH;  Surgeon: Samson Frederic, MD;  Location: [facility];  Service: Orthopedics;  Laterality: Right;   UPPER GI ENDOSCOPY  yrs ago   Patient Active Problem List   Diagnosis Date Noted   Positive colorectal cancer screening using Cologuard test 11/14/2021   Coronary artery disease    Statin intolerance 04/19/2021   Osteoarthritis of right hip 01/17/2021   Status post THR (total hip replacement) 01/17/2021   Chronic bilateral low back pain without sciatica 10/02/2020   Chronic pain syndrome 06/20/2020   Post laminectomy syndrome 06/20/2020   Sjogren's disease (HCC) 05/23/2020   Primary osteoarthritis of both hands 05/23/2020   Primary osteoarthritis of both feet 05/23/2020   Other secondary scoliosis, lumbar region 12/22/2019   Spondylosis without myelopathy or radiculopathy, lumbar region 12/22/2019   Cervical radiculopathy 10/18/2019   Numbness 09/21/2019   Bilateral carpal tunnel syndrome 09/21/2019   High risk medication use 09/21/2019   Memory loss 09/21/2019   Essential hypertension 08/25/2018  Abnormal SPEP 02/19/2018   Hand pain 02/20/2017   Multiple joint pain 02/18/2017   Disturbed cognition 06/25/2016   Sciatica, right side 10/30/2015   Chronic fatigue 08/04/2014   Gait disturbance 08/04/2014   Unspecified visual disturbance 01/31/2013   Transient vision disturbance 01/31/2013   Colon polyps 11/03/2011   POSTHERPETIC NEURALGIA 10/03/2009   DISPLCMT LUMBAR INTERVERT DISC W/O MYELOPATHY 09/05/2009   RENAL CALCULUS, RECURRENT 09/04/2009   Hyperlipidemia 08/31/2009   Multiple sclerosis (HCC) 08/31/2009   ALLERGIC RHINITIS 08/31/2009  Progress Note Reporting Period 10/14/2022 to  12/03/2022  See note below for Objective Data and Assessment of Progress/Goals.     [name] Kitchen     PCP: Iantha Fallen NP  REFERRING PROVIDER: Dr Romero Belling   REFERRING DIAG: Low Back Pain   Rationale for Evaluation and Treatment: Rehabilitation  THERAPY DIAG:  Difficulty in walking, not elsewhere classified  Cramp and spasm  Other low back pain  Muscle weakness (generalized)  ONSET DATE: About 11 days ago her nerve abiliation wore off.   SUBJECTIVE:                                                                                                                                                                                           SUBJECTIVE STATEMENT:  Patient continues to report improvement.  She still having days where she is increased soreness in her right hip and lower back but she also has other days where she is feeling better.  Over the weekend she was able to stand and do light gardening.  She reports mild tenderness today.   PERTINENT HISTORY:  Right Hip replacement 2022, Low back Pain, MS, TIA October 2023; MI, November 2022; CAD, Sjorgens syndrome  PAIN:  Are you having pain? Yes: NPRS scale: 2/10 Pain location: right hip  /lumbar spine Pain description: aching  Aggravating factors: standing and walking  Relieving factors: rest    PRECAUTIONS: None  WEIGHT BEARING RESTRICTIONS: No  FALLS:  Has patient fallen in last 6 months? 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   LUMBAR ROM:   AROM eval 5/15  Flexion Limited 50 % with pain  Mild improvement but still about 50%   Extension No limit    Right lateral flexion    Left lateral flexion    Right rotation Limited 50% with pain    Left rotation Limited 50% with pain     (Blank rows = not tested)  LOWER EXTREMITY ROM:     Passive  Right eval Left eval Right   Hip flexion 105 90 105  Hip extension   10 degrees from neutral before manual today. Too neutral after stretching   Hip abduction     Hip adduction     Hip internal rotation painful Painful  Painful   Hip external rotation   20 degrees with pain   Knee flexion     Knee extension     Ankle dorsiflexion     Ankle plantarflexion     Ankle inversion     Ankle eversion      (Blank rows = not tested)  LOWER EXTREMITY MMT:    MMT Right eval Left eval Right  3/26 Left  3/26 Right 5/15 Left 5/15  Hip flexion 13.1 12.3 15.1 26.1 18.2 26.2  Hip extension        Hip abduction 11.5 10.1 13.7 24.4 15.9 24  Hip adduction        Hip internal rotation        Hip external rotation        Knee flexion        Knee extension 14.0 13.3 17.5 33.3 20 38.2  Ankle dorsiflexion        Ankle plantarflexion        Ankle inversion        Ankle eversion         (Blank rows = not tested)  FUNCTIONAL TESTS:  5 times sit to stand: 28 sec  5/15 5 times sit to stand test 20 seconds  3-minute walk test 150 feet without requirement of seated rest break 5/15  GAIT:  TODAY'S TREATMENT:                                                                                                                              DATE:  5/23 Trigger Point Dry-Needling  Treatment instructions: Expect mild to moderate muscle soreness. S/S of pneumothorax if dry needled over a lung  field, and to seek immediate medical attention should they occur. Patient verbalized understanding of these instructions and education.  Patient Consent Given: Yes Education handout provided: Yes Muscles treated: Right gluteal 3 spots using 0.30 X75 needle left gluteal 2 spots using a 0.30 x 75 needle Treatment response/outcome: Good twitch response; immediate pain relief.   NuStep level 3 5 minutes  Manual: Roller to lateral hip, roller to anterior hip, trigger point  release to bilateral gluteal, trigger point release to lumbar spine.  Supine march 2 x 20 Supine hip abduction 2 x 20 red Short arc quad 2 x 20 bilateral  Standing weight shift x 20 2 inch step up 2 x 10 forward 2 x 10 lateral  5/15 Trigger Point Dry-Needling  Treatment instructions: Expect mild to moderate muscle soreness. S/S of pneumothorax if dry needled over a lung field, and to seek immediate medical attention should they occur. Patient verbalized understanding of these instructions and education.  Patient Consent Given: Yes Education handout provided: Yes Muscles treated: Right gluteal 3 spots using 0.30 X75 needle left gluteal 2 spots using a 0.30 x 75 needle Treatment response/outcome: Good twitch response; immediate pain relief.   NuStep level 3 5 minutes  Manual: Roller to lateral hip, roller to anterior hip, trigger point release to bilateral gluteal, trigger point release to lumbar spine.  LTR x 20 Supine march 2 x 20 Hip abduction green 2 x 20 Long arc quad red 2 x 20 each leg Standing heel raise/weight shift x 20     PATIENT EDUCATION:  Education details: reviewed HEP, symptom management  Person educated: Patient Education method: Explanation, Demonstration, Tactile cues, Verbal cues, and Handouts Education comprehension: verbalized understanding, returned demonstration, verbal cues required, tactile cues required, and needs further education  HOME EXERCISE PROGRAM: Has previous HEP. Will  review next visit  ASSESSMENT:  CLINICAL IMPRESSION: The is making progress.  We were able to gear her treatment session more towards exercise today.  She had no significant pain with her exercises.  We initiated stair training today.  We will monitor her response to stair training.  Therapy will continue to progress as tolerated   OBJECTIVE IMPAIRMENTS: Abnormal gait, decreased activity tolerance, decreased balance, decreased endurance, decreased knowledge of use of DME, decreased mobility, difficulty walking, decreased ROM, decreased strength, increased muscle spasms, and pain.   ACTIVITY LIMITATIONS: carrying, lifting, bending, standing, squatting, sleeping, stairs, transfers, and locomotion level  PARTICIPATION LIMITATIONS: meal prep, cleaning, laundry, driving, shopping, community activity, and yard work  PERSONAL FACTORS: Right Hip replacement 2022, Low back Pain, MS, TIA October 2023; MI, November 2022; CAD, Sjorgens syndrome are also affecting patient's functional outcome. MS  REHAB POTENTIAL: Good  CLINICAL DECISION MAKING: Evolving/moderate complexity declining mobility   EVALUATION COMPLEXITY: Moderate   GOALS: Goals reviewed with patient? No  SHORT TERM GOALS: Target date: 09/29/2022    Patient will increase gross bilateral lower extremity strength by 5 pounds Baseline: Goal status: progressing 5/15  2.  Patient will increase lumbar flexion by 10 degrees Baseline:  Goal status:  mild improvement but still painful   3.  Patient will be independent with basic after exercise program and stretching program Baseline:  Goal status: has basic HEP    LONG TERM GOALS: Target date: 09/29/2022     Patient will stand for greater than 30 minutes without increased pain Baseline:  Goal status: improving 5/15   2.  Patient will pick item up off the floor without increased low back pain Baseline:  Goal status:still having pain 5/15  3.  Patient will ambulate community  distances without report of increased low back pain Baseline:  Goal status: INITIAL  4.  Patient will report improved ability to sleep through the night secondary to back pain Baseline:  Goal status: INITIAL    PLAN:  PT FREQUENCY: 1-2x/week  PT DURATION: 16 weeks   PLANNED INTERVENTIONS: Therapeutic exercises, Therapeutic activity, Neuromuscular re-education, Balance training, Gait  training, Patient/Family education, Self Care, Joint mobilization, Stair training, DME instructions, Aquatic Therapy, Dry Needling, Spinal mobilization, Cryotherapy, Moist heat, Ionotophoresis 4mg /ml Dexamethasone, and Manual therapy.  PLAN FOR NEXT SESSION: Consider manual therapy to the back and hips, consider trigger point dry needling to back and hips.  Reviewed basic HEP.  Begin active strengthening of hips and core.  Begin gait training and functional mobility training.   Dessie Coma, PT 10/28/2022, 10:26 AM

## 2022-12-12 DIAGNOSIS — H04123 Dry eye syndrome of bilateral lacrimal glands: Secondary | ICD-10-CM | POA: Diagnosis not present

## 2022-12-12 DIAGNOSIS — H00021 Hordeolum internum right upper eyelid: Secondary | ICD-10-CM | POA: Diagnosis not present

## 2022-12-17 ENCOUNTER — Ambulatory Visit (HOSPITAL_BASED_OUTPATIENT_CLINIC_OR_DEPARTMENT_OTHER): Payer: Medicare Other | Admitting: Physical Therapy

## 2022-12-17 ENCOUNTER — Ambulatory Visit (HOSPITAL_BASED_OUTPATIENT_CLINIC_OR_DEPARTMENT_OTHER): Payer: Medicare Other | Admitting: Cardiology

## 2022-12-17 ENCOUNTER — Encounter (HOSPITAL_BASED_OUTPATIENT_CLINIC_OR_DEPARTMENT_OTHER): Payer: Self-pay | Admitting: Physical Therapy

## 2022-12-17 DIAGNOSIS — M5459 Other low back pain: Secondary | ICD-10-CM

## 2022-12-17 DIAGNOSIS — M6281 Muscle weakness (generalized): Secondary | ICD-10-CM

## 2022-12-17 DIAGNOSIS — R262 Difficulty in walking, not elsewhere classified: Secondary | ICD-10-CM | POA: Diagnosis not present

## 2022-12-17 DIAGNOSIS — R252 Cramp and spasm: Secondary | ICD-10-CM | POA: Diagnosis not present

## 2022-12-17 NOTE — Therapy (Unsigned)
OUTPATIENT PHYSICAL THERAPY THORACOLUMBAR Treatment    Patient Name: Dawn [name] MRN: [id number] DOB:06-29-1946, 77 y.o., female Today's Date: 12/17/2022  END OF SESSION:  PT End of Session - 12/17/22 1201     Visit Number 18    Number of Visits 26    Date for PT Re-Evaluation 02/11/23    Authorization Type progress note done at visit 16 next at 26    PT Start Time 1155   PT late   PT Stop Time 1235    PT Time Calculation (min) 40 min    Activity Tolerance Patient tolerated treatment well    Behavior During Therapy [facility] for tasks assessed/performed              Past Medical History:  Diagnosis Date   Allergy    CAD (coronary artery disease)    s/p DES to RCA in 06/2021   Dry eyes    Endometrial polyp    Essential hypertension 08/25/2018   GERD (gastroesophageal reflux disease)    History of hiatal hernia    History of kidney stones    Hot flashes, menopausal 10/13/2011   Estradiol started    Hyperlipidemia    Jaundice as teenager   no problems since   Memory loss 09/21/2019     1/2 of feet numb all the time   Multiple sclerosis (HCC) dx 2001   Neuropathy    bilateral feet   Osteoarthritis    Sjogren's syndrome (HCC) dx oct 2021   sore muscvles, dry mouth and eyes   Stroke (HCC)    Vertigo    Vision abnormalities    Past Surgical History:  Procedure Laterality Date   ABDOMINAL HYSTERECTOMY  2002   partial   COLONOSCOPY  01/27/2022   2 day prep   colonscopy  2011   CORONARY STENT INTERVENTION N/A 07/18/2021   Procedure: CORONARY STENT INTERVENTION;  Surgeon: Corky Crafts, MD;  Location: [facility];  Service: Cardiovascular;  Laterality: N/A;   DILATATION & CURETTAGE/HYSTEROSCOPY WITH MYOSURE N/A 06/08/2020   Procedure: DILATATION & CURETTAGE/HYSTEROSCOPY/Polypectomy WITH MYOSURE;  Surgeon: Toy Baker, DO;  Location: [facility];  Service: Gynecology;  Laterality: N/A;   LEFT HEART CATH AND CORONARY ANGIOGRAPHY  N/A 07/18/2021   Procedure: LEFT HEART CATH AND CORONARY ANGIOGRAPHY;  Surgeon: Corky Crafts, MD;  Location: [facility] INVASIVE CV LAB;  Service: Cardiovascular;  Laterality: N/A;   LUMBAR FUSION  2001   TOTAL HIP ARTHROPLASTY Right 01/17/2021   Procedure: TOTAL HIP ARTHROPLASTY ANTERIOR APPROACH;  Surgeon: Samson Frederic, MD;  Location: [facility];  Service: Orthopedics;  Laterality: Right;   UPPER GI ENDOSCOPY  yrs ago   Patient Active Problem List   Diagnosis Date Noted   Positive colorectal cancer screening using Cologuard test 11/14/2021   Coronary artery disease    Statin intolerance 04/19/2021   Osteoarthritis of right hip 01/17/2021   Status post THR (total hip replacement) 01/17/2021   Chronic bilateral low back pain without sciatica 10/02/2020   Chronic pain syndrome 06/20/2020   Post laminectomy syndrome 06/20/2020   Sjogren's disease (HCC) 05/23/2020   Primary osteoarthritis of both hands 05/23/2020   Primary osteoarthritis of both feet 05/23/2020   Other secondary scoliosis, lumbar region 12/22/2019   Spondylosis without myelopathy or radiculopathy, lumbar region 12/22/2019   Cervical radiculopathy 10/18/2019   Numbness 09/21/2019   Bilateral carpal tunnel syndrome 09/21/2019   High risk medication use 09/21/2019   Memory loss 09/21/2019  Essential hypertension 08/25/2018   Abnormal SPEP 02/19/2018   Hand pain 02/20/2017   Multiple joint pain 02/18/2017   Disturbed cognition 06/25/2016   Sciatica, right side 10/30/2015   Chronic fatigue 08/04/2014   Gait disturbance 08/04/2014   Unspecified visual disturbance 01/31/2013   Transient vision disturbance 01/31/2013   Colon polyps 11/03/2011   POSTHERPETIC NEURALGIA 10/03/2009   DISPLCMT LUMBAR INTERVERT DISC W/O MYELOPATHY 09/05/2009   RENAL CALCULUS, RECURRENT 09/04/2009   Hyperlipidemia 08/31/2009   Multiple sclerosis (HCC) 08/31/2009   ALLERGIC RHINITIS 08/31/2009  Progress Note Reporting Period 10/14/2022 to  12/03/2022  See note below for Objective Data and Assessment of Progress/Goals.     [name] Kitchen     PCP: Iantha Fallen NP  REFERRING PROVIDER: Dr Romero Belling   REFERRING DIAG: Low Back Pain   Rationale for Evaluation and Treatment: Rehabilitation  THERAPY DIAG:  Difficulty in walking, not elsewhere classified  Cramp and spasm  Other low back pain  Muscle weakness (generalized)  ONSET DATE: About 11 days ago her nerve abiliation wore off.   SUBJECTIVE:                                                                                                                                                                                           SUBJECTIVE STATEMENT:  Patient continues to report improvement.  She still having days where she is increased soreness in her right hip and lower back but she also has other days where she is feeling better.  Over the weekend she was able to stand and do light gardening.  She reports mild tenderness today.   PERTINENT HISTORY:  Right Hip replacement 2022, Low back Pain, MS, TIA October 2023; MI, November 2022; CAD, Sjorgens syndrome  PAIN:  Are you having pain? Yes: NPRS scale: 2/10 Pain location: right hip  /lumbar spine Pain description: aching  Aggravating factors: standing and walking  Relieving factors: rest    PRECAUTIONS: None  WEIGHT BEARING RESTRICTIONS: No  FALLS:  Has patient fallen in last 6 months? 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   LUMBAR ROM:   AROM eval 5/15  Flexion Limited 50 % with pain  Mild improvement but still about 50%   Extension No limit    Right lateral flexion    Left lateral flexion    Right rotation Limited 50% with pain    Left rotation Limited 50% with pain     (Blank rows = not tested)  LOWER EXTREMITY ROM:     Passive  Right eval Left eval Right   Hip flexion 105 90 105  Hip extension   10 degrees from neutral before manual today. Too neutral after stretching   Hip abduction     Hip adduction     Hip internal rotation painful Painful  Painful   Hip external rotation   20 degrees with pain   Knee flexion     Knee extension     Ankle dorsiflexion     Ankle plantarflexion     Ankle inversion     Ankle eversion      (Blank rows = not tested)  LOWER EXTREMITY MMT:    MMT Right eval Left eval Right  3/26 Left  3/26 Right 5/15 Left 5/15  Hip flexion 13.1 12.3 15.1 26.1 18.2 26.2  Hip extension        Hip abduction 11.5 10.1 13.7 24.4 15.9 24  Hip adduction        Hip internal rotation        Hip external rotation        Knee flexion        Knee extension 14.0 13.3 17.5 33.3 20 38.2  Ankle dorsiflexion        Ankle plantarflexion        Ankle inversion        Ankle eversion         (Blank rows = not tested)  FUNCTIONAL TESTS:  5 times sit to stand: 28 sec  5/15 5 times sit to stand test 20 seconds  3-minute walk test 150 feet without requirement of seated rest break 5/15  GAIT:  TODAY'S TREATMENT:                                                                                                                              DATE:  5/29 Trigger Point Dry-Needling  Treatment instructions: Expect mild to moderate muscle soreness. S/S of pneumothorax if dry needled over a lung  field, and to seek immediate medical attention should they occur. Patient verbalized understanding of these instructions and education.  Patient Consent Given: Yes Education handout provided: Yes Muscles treated: Right gluteal 3 spots using 0.30 X75 needle left gluteal 2 spots using a 0.30 x 75 needle Treatment response/outcome: Good twitch response; immediate pain relief.  Supine march 2 x 20 Supine hip abduction 2 x 20 red Short arc quad  2 x 20 bilateral  Manual: Roller to lateral hip, roller to anterior hip, trigger point release to bilateral gluteal, trigger point release to lumbar spine.    5/23 Trigger Point Dry-Needling  Treatment instructions: Expect mild to moderate muscle soreness. S/S of pneumothorax if dry needled over a lung field, and to seek immediate medical attention should they occur. Patient verbalized understanding of these instructions and education.  Patient Consent Given: Yes Education handout provided: Yes Muscles treated: Right gluteal 3 spots using 0.30 X75 needle left gluteal 2 spots using a 0.30 x 75 needle Treatment response/outcome: Good twitch response; immediate pain relief.   NuStep level 3 5 minutes  Manual: Roller to lateral hip, roller to anterior hip, trigger point release to bilateral gluteal, trigger point release to lumbar spine.  Supine march 2 x 20 Supine hip abduction 2 x 20 red Short arc quad 2 x 20 bilateral  Standing weight shift x 20 2 inch step up 2 x 10 forward 2 x 10 lateral  5/15 Trigger Point Dry-Needling  Treatment instructions: Expect mild to moderate muscle soreness. S/S of pneumothorax if dry needled over a lung field, and to seek immediate medical attention should they occur. Patient verbalized understanding of these instructions and education.  Patient Consent Given: Yes Education handout provided: Yes Muscles treated: Right gluteal 3 spots using 0.30 X75 needle left gluteal 2 spots using a 0.30 x 75 needle Treatment  response/outcome: Good twitch response; immediate pain relief.   NuStep level 3 5 minutes  Manual: Roller to lateral hip, roller to anterior hip, trigger point release to bilateral gluteal, trigger point release to lumbar spine.  LTR x 20 Supine march 2 x 20 Hip abduction green 2 x 20 Long arc quad red 2 x 20 each leg Standing heel raise/weight shift x 20     PATIENT EDUCATION:  Education details: reviewed HEP, symptom management  Person educated: Patient Education method: Explanation, Demonstration, Tactile cues, Verbal cues, and Handouts Education comprehension: verbalized understanding, returned demonstration, verbal cues required, tactile cues required, and needs further education  HOME EXERCISE PROGRAM: Has previous HEP. Will review next visit  ASSESSMENT:  CLINICAL IMPRESSION: The is making progress.  We were able to gear her treatment session more towards exercise today.  She had no significant pain with her exercises.  We initiated stair training today.  We will monitor her response to stair training.  Therapy will continue to progress as tolerated   OBJECTIVE IMPAIRMENTS: Abnormal gait, decreased activity tolerance, decreased balance, decreased endurance, decreased knowledge of use of DME, decreased mobility, difficulty walking, decreased ROM, decreased strength, increased muscle spasms, and pain.   ACTIVITY LIMITATIONS: carrying, lifting, bending, standing, squatting, sleeping, stairs, transfers, and locomotion level  PARTICIPATION LIMITATIONS: meal prep, cleaning, laundry, driving, shopping, community activity, and yard work  PERSONAL FACTORS: Right Hip replacement 2022, Low back Pain, MS, TIA October 2023; MI, November 2022; CAD, Sjorgens syndrome are also affecting patient's functional outcome. MS  REHAB POTENTIAL: Good  CLINICAL DECISION MAKING: Evolving/moderate complexity declining mobility   EVALUATION COMPLEXITY: Moderate   GOALS: Goals reviewed with  patient? No  SHORT TERM GOALS: Target date: 09/29/2022    Patient will increase gross bilateral lower extremity strength by 5 pounds Baseline: Goal status: progressing 5/15  2.  Patient will increase lumbar flexion by 10 degrees Baseline:  Goal status:  mild improvement but still painful   3.  Patient will be independent with basic after exercise program and stretching program Baseline:  Goal status:  has basic HEP    LONG TERM GOALS: Target date: 09/29/2022     Patient will stand for greater than 30 minutes without increased pain Baseline:  Goal status: improving 5/15   2.  Patient will pick item up off the floor without increased low back pain Baseline:  Goal status:still having pain 5/15  3.  Patient will ambulate community distances without report of increased low back pain Baseline:  Goal status: INITIAL  4.  Patient will report improved ability to sleep through the night secondary to back pain Baseline:  Goal status: INITIAL    PLAN:  PT FREQUENCY: 1-2x/week  PT DURATION: 16 weeks   PLANNED INTERVENTIONS: Therapeutic exercises, Therapeutic activity, Neuromuscular re-education, Balance training, Gait training, Patient/Family education, Self Care, Joint mobilization, Stair training, DME instructions, Aquatic Therapy, Dry Needling, Spinal mobilization, Cryotherapy, Moist heat, Ionotophoresis 4mg /ml Dexamethasone, and Manual therapy.  PLAN FOR NEXT SESSION: Consider manual therapy to the back and hips, consider trigger point dry needling to back and hips.  Reviewed basic HEP.  Begin active strengthening of hips and core.  Begin gait training and functional mobility training.   Dessie Coma, PT 10/28/2022, 1:30 PM

## 2022-12-24 ENCOUNTER — Ambulatory Visit (HOSPITAL_BASED_OUTPATIENT_CLINIC_OR_DEPARTMENT_OTHER): Payer: Medicare Other | Attending: Physical Medicine and Rehabilitation | Admitting: Physical Therapy

## 2022-12-24 DIAGNOSIS — M5459 Other low back pain: Secondary | ICD-10-CM | POA: Insufficient documentation

## 2022-12-24 DIAGNOSIS — R262 Difficulty in walking, not elsewhere classified: Secondary | ICD-10-CM | POA: Diagnosis not present

## 2022-12-24 DIAGNOSIS — M6281 Muscle weakness (generalized): Secondary | ICD-10-CM | POA: Insufficient documentation

## 2022-12-24 DIAGNOSIS — R252 Cramp and spasm: Secondary | ICD-10-CM | POA: Diagnosis not present

## 2022-12-25 ENCOUNTER — Encounter (HOSPITAL_BASED_OUTPATIENT_CLINIC_OR_DEPARTMENT_OTHER): Payer: Self-pay | Admitting: Physical Therapy

## 2022-12-26 ENCOUNTER — Ambulatory Visit (INDEPENDENT_AMBULATORY_CARE_PROVIDER_SITE_OTHER): Payer: Medicare Other | Admitting: Family

## 2022-12-26 ENCOUNTER — Ambulatory Visit
Admission: RE | Admit: 2022-12-26 | Discharge: 2022-12-26 | Disposition: A | Discharge status: Home / Self Care | Payer: Medicare Other | Source: Ambulatory Visit | Attending: Family | Admitting: Family

## 2022-12-26 ENCOUNTER — Encounter: Payer: Self-pay | Admitting: Family

## 2022-12-26 VITALS — BP 116/60 | HR 74 | Temp 96.7°F | Ht 62.0 in | Wt 182.2 lb

## 2022-12-26 DIAGNOSIS — M25532 Pain in left wrist: Secondary | ICD-10-CM

## 2022-12-26 MED ORDER — DICLOFENAC SODIUM 1 % EX GEL
2.0000 g | Freq: Four times a day (QID) | CUTANEOUS | 1 refills | Status: DC
Start: 2022-12-26 — End: 2023-11-02

## 2022-12-26 NOTE — Progress Notes (Signed)
Provider: Richarda Blade FNP-C  Sequoyah Counterman, [name], NP  Patient Care Team: Suzzette Gasparro, [name], NP as PCP - General (Family Medicine) Maisie Fus, MD as PCP - Cardiology (Cardiology) Valeria Batman, MD (Inactive) as Consulting Physician (Orthopedic Surgery) [name], Pearletha Furl, MD (Neurology) Krista Blue, [place], OD (Optometry) Lynden Ang, NP as Nurse Practitioner (Obstetrics and Gynecology) Janalyn Harder, MD (Inactive) as Consulting Physician (Dermatology)  Extended Emergency Contact Information Primary Emergency Contact: [facility] Phone: [phone] Mobile Phone: [phone] Relation: Friend Secondary Emergency Contact: Cranford,Jane Mobile Phone: [phone] Relation: Friend  Code Status:  Full Code  Goals of care: Advanced Directive information    10/14/2022    6:59 PM  Advanced Directives  Does Patient Have a Medical Advance Directive? No  Would patient like information on creating a medical advance directive? No - Patient declined     Chief Complaint  Patient presents with   Acute Visit    Patient presents today for left hand/ wrist pain for a month now. She have not taking medications for the pain. She reports pain level is 6 out 10. Patient she left handed.     HPI:  Pt is a 77 y.o. female seen today for an acute visit for evaluation of left hand and wrist pain.States couple of months ago she accidentally hit left upper arm and elbow. Yesterday she started having pain on left wrist rating pain 6/10 on scale. Wonders whether it's related to accidental hit of upper arm and elbow.No swelling,Redness, numbness or tingling.states hand feels weaker.she is left handed.  Has been working with Physical Therapy at [facility] for lower back.Also does swimming at [facility] in [place].     Past Medical History:  Diagnosis Date   Allergy    CAD (coronary artery disease)    s/p DES to RCA in 06/2021   Dry eyes    Endometrial polyp    Essential  hypertension 08/25/2018   GERD (gastroesophageal reflux disease)    History of hiatal hernia    History of kidney stones    Hot flashes, menopausal 10/13/2011   Estradiol started    Hyperlipidemia    Jaundice as teenager   no problems since   Memory loss 09/21/2019     1/2 of feet numb all the time   Multiple sclerosis (HCC) dx 2001   Neuropathy    bilateral feet   Osteoarthritis    Sjogren's syndrome (HCC) dx oct 2021   sore muscvles, dry mouth and eyes   Stroke [facility])    Vertigo    Vision abnormalities    Past Surgical History:  Procedure Laterality Date   ABDOMINAL HYSTERECTOMY  2002   partial   COLONOSCOPY  01/27/2022   2 day prep   colonscopy  2011   CORONARY STENT INTERVENTION N/A 07/18/2021   Procedure: CORONARY STENT INTERVENTION;  Surgeon: Corky Crafts, MD;  Location: [facility];  Service: Cardiovascular;  Laterality: N/A;   DILATATION & CURETTAGE/HYSTEROSCOPY WITH MYOSURE N/A 06/08/2020   Procedure: DILATATION & CURETTAGE/HYSTEROSCOPY/Polypectomy WITH MYOSURE;  Surgeon: Toy Baker, DO;  Location: [facility];  Service: Gynecology;  Laterality: N/A;   LEFT HEART CATH AND CORONARY ANGIOGRAPHY N/A 07/18/2021   Procedure: LEFT HEART CATH AND CORONARY ANGIOGRAPHY;  Surgeon: Corky Crafts, MD;  Location: [facility] INVASIVE CV LAB;  Service: Cardiovascular;  Laterality: N/A;   LUMBAR FUSION  2001   TOTAL HIP ARTHROPLASTY Right 01/17/2021   Procedure: TOTAL HIP ARTHROPLASTY ANTERIOR APPROACH;  Surgeon:  Samson Frederic, MD;  Location: [facility];  Service: Orthopedics;  Laterality: Right;   UPPER GI ENDOSCOPY  yrs ago    Allergies  Allergen Reactions   Statins Other (See Comments)    Muscle pain; Tolerates Crestor     Outpatient Encounter Medications as of 12/26/2022  Medication Sig   acetaminophen (TYLENOL) 500 MG tablet Take 1,000 mg by mouth every 6 (six) hours as needed for mild pain or moderate pain.   aspirin EC 81 MG tablet Take  81 mg by mouth daily. Swallow whole.   carvedilol (COREG) 6.25 MG tablet Take 1 tablet (6.25 mg total) by mouth 2 (two) times daily with a meal.   cholecalciferol (VITAMIN D) 25 MCG (1000 UNIT) tablet Take 2,000 Units by mouth daily.   clonazePAM (KLONOPIN) 0.5 MG tablet TAKE 1 TABLET BY MOUTH AT BEDTIME   fexofenadine (ALLEGRA) 180 MG tablet Take 180 mg by mouth at bedtime.    Flaxseed, Linseed, (FLAXSEED OIL PO) Take 5-10 mLs by mouth 3 (three) times a week. Power   fluticasone (FLONASE) 50 MCG/ACT nasal spray Place 1 spray into both nostrils daily as needed for allergies or rhinitis.   hydrALAZINE (APRESOLINE) 25 MG tablet Take 1 tablet (25 mg total) by mouth 3 (three) times daily.   Lidocaine 4 % PTCH Place 1 patch onto the skin daily as needed (mild pain). Remove & Discard patch within 12 hours or as directed by MD   losartan (COZAAR) 100 MG tablet TAKE 1 TABLET(100 MG) BY MOUTH DAILY   melatonin 5 MG TABS Take 5 mg by mouth at bedtime as needed (Sleep).   modafinil (PROVIGIL) 200 MG tablet One po qAM and one po qNoon   nitroGLYCERIN (NITROSTAT) 0.4 MG SL tablet Place 1 tablet (0.4 mg total) under the tongue every 5 (five) minutes as needed for chest pain.   pantoprazole (PROTONIX) 40 MG tablet TAKE 1 TABLET(40 MG) BY MOUTH DAILY   rosuvastatin (CRESTOR) 20 MG tablet Take 1 tablet (20 mg total) by mouth daily.   solifenacin (VESICARE) 5 MG tablet Take 5 mg by mouth daily.   No facility-administered encounter medications on file as of 12/26/2022.    Review of Systems  Constitutional:  Negative for appetite change, chills, fatigue and fever.  Musculoskeletal:  Positive for arthralgias, back pain and gait problem. Negative for neck pain and neck stiffness.  Skin:  Negative for color change, pallor, rash and wound.  Neurological:  Negative for dizziness, weakness, light-headedness, numbness and headaches.    Immunization History  Administered Date(s) Administered   Fluad Quad(high Dose  65+) 04/01/2019, 04/19/2021   Influenza Whole 05/14/2011   Influenza, High Dose Seasonal PF 04/30/2017, 05/15/2018, 05/18/2020   Influenza-Unspecified 03/04/2022   PFIZER(Purple Top)SARS-COV-2 Vaccination 08/27/2019, 09/21/2019, 03/07/2020, 11/16/2020   Pfizer Covid-19 Vaccine Bivalent Booster 50yrs & up 03/04/2022   Pneumococcal Conjugate-13 08/30/2018   Pneumococcal Polysaccharide-23 03/21/2009, 04/20/2020   Td (Adult), 2 Lf Tetanus Toxid, Preservative Free 12/04/2009   Zoster, Live 11/29/2009   Pertinent  Health Maintenance Due  Topic Date Due   DEXA SCAN  Never done   INFLUENZA VACCINE  02/19/2023      05/08/2022   10:18 PM 05/09/2022   10:17 AM 05/14/2022    5:29 PM 10/27/2022   10:08 AM 12/26/2022   10:07 AM  Fall Risk  Falls in the past year?    1 1  Was there an injury with Fall?    1 0  Fall Risk Category Calculator  2 1  (RETIRED) Patient Fall Risk Level Low fall risk Low fall risk Moderate fall risk    Patient at Risk for Falls Due to    History of fall(s) History of fall(s)  Fall risk Follow up    Falls evaluation completed Falls evaluation completed   Functional Status Survey:    Vitals:   12/26/22 1007  BP: 116/60  Pulse: 74  Temp: (!) 96.7 F (35.9 C)  SpO2: 97%  Weight: 182 lb 3.2 oz (82.6 kg)  Height: 5\' 2"  (1.575 m)   Body mass index is 33.32 kg/m. Physical Exam Constitutional:      General: She is not in acute distress.    Appearance: She is not ill-appearing or toxic-appearing.  Cardiovascular:     Rate and Rhythm: Normal rate and regular rhythm.     Pulses: Normal pulses.     Heart sounds: Normal heart sounds. No murmur heard.    No friction rub. No gallop.  Pulmonary:     Effort: Pulmonary effort is normal. No respiratory distress.     Breath sounds: Normal breath sounds. No wheezing, rhonchi or rales.  Chest:     Chest wall: No tenderness.  Musculoskeletal:     Right wrist: Normal.     Left wrist: Tenderness present. No swelling,  effusion or crepitus. Normal range of motion. Normal pulse.     Right hand: Normal.     Left hand: Normal.  Skin:    General: Skin is warm and dry.     Coloration: Skin is not pale.     Findings: No erythema or rash.  Neurological:     Mental Status: She is alert and oriented to person, place, and time.     Cranial Nerves: No cranial nerve deficit.     Sensory: No sensory deficit.     Motor: No weakness.     Coordination: Coordination normal.     Gait: Gait abnormal.  Psychiatric:        Mood and Affect: Mood normal.        Behavior: Behavior normal.     Labs reviewed: Recent Labs    05/14/22 1720 05/14/22 1755 07/23/22 0958 10/22/22 1018  NA 139 138 137 139  K 3.9 3.9 4.2 4.6  CL 109 107 102 105  CO2 21*  --  28 25  GLUCOSE 77 78 80 93  BUN 19 20 17  26*  CREATININE 0.91 0.70 0.68 0.69  CALCIUM 9.4  --  9.0 9.2   Recent Labs    05/09/22 0014 05/14/22 1720 07/23/22 0958 10/22/22 1018  AST 23 21 18 15   ALT 19 14 17 17   ALKPHOS 104 93  --   --   BILITOT 0.5 0.5 0.4 0.3  PROT 6.7 6.3* 6.1 5.9*  ALBUMIN 4.1 4.0  --   --    Recent Labs    05/14/22 1720 05/14/22 1755 07/23/22 0958 10/22/22 1018  WBC 4.5  --  5.5 7.6  NEUTROABS 3.2  --  4,202 6,209  HGB 12.8 13.3 13.2 13.5  HCT 39.7 39.0 39.4 40.7  MCV 93.0  --  91.2 89.3  PLT 183  --  200 214   Lab Results  Component Value Date   TSH 2.41 10/22/2022   Lab Results  Component Value Date   HGBA1C 5.4 06/15/2021   Lab Results  Component Value Date   CHOL 137 10/22/2022   HDL 67 10/22/2022   LDLCALC 56 10/22/2022   LDLDIRECT 130.2  11/03/2011   TRIG 64 10/22/2022   CHOLHDL 2.0 10/22/2022    Significant Diagnostic Results in last 30 days:  No results found.  Assessment/Plan  Acute pain of left wrist Wrist without any erythema or swelling slight tenderness on palpation and ROM.  - continue with OTC analgesics  - will obtain imaging  - DG Wrist Complete Left; Future - diclofenac Sodium  (VOLTAREN) 1 % GEL; Apply 2 g topically 4 (four) times daily.  Dispense: 50 g; Refill: 1  Family/ staff Communication: Reviewed plan of care with patient verbalized understanding   Labs/tests ordered:  - DG Wrist Complete Left; Future  Next Appointment: Return if symptoms worsen or fail to improve.   Caesar Bookman, NP

## 2022-12-26 NOTE — Patient Instructions (Signed)
-   Please get left wrist X-ray at [facility] imaging at [facility] then will call you with results.  - Take Extra strength Tylenol 500 mg tablet one by mouth every 8 hrs as needed for pain

## 2022-12-29 NOTE — Therapy (Signed)
OUTPATIENT PHYSICAL THERAPY THORACOLUMBAR Treatment    Patient Name: Dawn [name] MRN: [id number] DOB:1945-10-03, 77 y.o., female Today's Date: 12/29/2022  END OF SESSION:     Past Medical History:  Diagnosis Date   Allergy    CAD (coronary artery disease)    s/p DES to RCA in 06/2021   Dry eyes    Endometrial polyp    Essential hypertension 08/25/2018   GERD (gastroesophageal reflux disease)    History of hiatal hernia    History of kidney stones    Hot flashes, menopausal 10/13/2011   Estradiol started    Hyperlipidemia    Jaundice as teenager   no problems since   Memory loss 09/21/2019     1/2 of feet numb all the time   Multiple sclerosis (HCC) dx 2001   Neuropathy    bilateral feet   Osteoarthritis    Sjogren's syndrome (HCC) dx oct 2021   sore muscvles, dry mouth and eyes   Stroke (HCC)    Vertigo    Vision abnormalities    Past Surgical History:  Procedure Laterality Date   ABDOMINAL HYSTERECTOMY  2002   partial   COLONOSCOPY  01/27/2022   2 day prep   colonscopy  2011   CORONARY STENT INTERVENTION N/A 07/18/2021   Procedure: CORONARY STENT INTERVENTION;  Surgeon: Corky Crafts, MD;  Location: [facility];  Service: Cardiovascular;  Laterality: N/A;   DILATATION & CURETTAGE/HYSTEROSCOPY WITH MYOSURE N/A 06/08/2020   Procedure: DILATATION & CURETTAGE/HYSTEROSCOPY/Polypectomy WITH MYOSURE;  Surgeon: Toy Baker, DO;  Location: [facility];  Service: Gynecology;  Laterality: N/A;   LEFT HEART CATH AND CORONARY ANGIOGRAPHY N/A 07/18/2021   Procedure: LEFT HEART CATH AND CORONARY ANGIOGRAPHY;  Surgeon: Corky Crafts, MD;  Location: [facility];  Service: Cardiovascular;  Laterality: N/A;   LUMBAR FUSION  2001   TOTAL HIP ARTHROPLASTY Right 01/17/2021   Procedure: TOTAL HIP ARTHROPLASTY ANTERIOR APPROACH;  Surgeon: Samson Frederic, MD;  Location: [facility];  Service: Orthopedics;  Laterality: Right;   UPPER GI  ENDOSCOPY  yrs ago   Patient Active Problem List   Diagnosis Date Noted   Positive colorectal cancer screening using Cologuard test 11/14/2021   Coronary artery disease    Statin intolerance 04/19/2021   Osteoarthritis of right hip 01/17/2021   Status post THR (total hip replacement) 01/17/2021   Chronic bilateral low back pain without sciatica 10/02/2020   Chronic pain syndrome 06/20/2020   Post laminectomy syndrome 06/20/2020   Sjogren's disease (HCC) 05/23/2020   Primary osteoarthritis of both hands 05/23/2020   Primary osteoarthritis of both feet 05/23/2020   Other secondary scoliosis, lumbar region 12/22/2019   Spondylosis without myelopathy or radiculopathy, lumbar region 12/22/2019   Cervical radiculopathy 10/18/2019   Numbness 09/21/2019   Bilateral carpal tunnel syndrome 09/21/2019   High risk medication use 09/21/2019   Memory loss 09/21/2019   Essential hypertension 08/25/2018   Abnormal SPEP 02/19/2018   Hand pain 02/20/2017   Multiple joint pain 02/18/2017   Disturbed cognition 06/25/2016   Sciatica, right side 10/30/2015   Chronic fatigue 08/04/2014   Gait disturbance 08/04/2014   Unspecified visual disturbance 01/31/2013   Transient vision disturbance 01/31/2013   Colon polyps 11/03/2011   POSTHERPETIC NEURALGIA 10/03/2009   DISPLCMT LUMBAR INTERVERT DISC W/O MYELOPATHY 09/05/2009   RENAL CALCULUS, RECURRENT 09/04/2009   Hyperlipidemia 08/31/2009   Multiple sclerosis (HCC) 08/31/2009   ALLERGIC RHINITIS 08/31/2009  Progress Note Reporting Period 10/14/2022  to 12/03/2022  See note below for Objective Data and Assessment of Progress/Goals.     [name] Kitchen     PCP: Iantha Fallen NP  REFERRING PROVIDER: Dr Romero Belling   REFERRING DIAG: Low Back Pain   Rationale for Evaluation and Treatment: Rehabilitation  THERAPY DIAG:  Difficulty in walking, not elsewhere classified  Cramp and spasm  Other low back pain  Muscle weakness (generalized)  ONSET DATE:  About 11 days ago her nerve abiliation wore off.   SUBJECTIVE:                                                                                                                                                                                           SUBJECTIVE STATEMENT: The patient continues to report improved functional mobility. She has been gardening and walking further.   PERTINENT HISTORY:  Right Hip replacement 2022, Low back Pain, MS, TIA October 2023; MI, November 2022; CAD, Sjorgens syndrome  PAIN:  Are you having pain? Yes: NPRS scale: 2/10 Pain location: right hip  /lumbar spine Pain description: aching  Aggravating factors: standing and walking  Relieving factors: rest    PRECAUTIONS: None  WEIGHT BEARING RESTRICTIONS: No  FALLS:  Has patient fallen in last 6 months? 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   LUMBAR ROM:   AROM eval 5/15  Flexion Limited 50 % with pain  Mild improvement but still about 50%   Extension No limit    Right lateral flexion    Left lateral flexion    Right rotation Limited 50% with pain    Left rotation Limited 50% with pain     (Blank rows = not tested)  LOWER EXTREMITY ROM:     Passive  Right eval Left eval Right   Hip flexion 105 90 105  Hip extension    10 degrees from neutral before manual today. Too neutral after stretching   Hip abduction     Hip  adduction     Hip internal rotation painful Painful  Painful   Hip external rotation   20 degrees with pain   Knee flexion     Knee extension     Ankle dorsiflexion     Ankle plantarflexion     Ankle inversion     Ankle eversion      (Blank rows = not tested)  LOWER EXTREMITY MMT:    MMT Right eval Left eval Right  3/26 Left  3/26 Right 5/15 Left 5/15  Hip flexion 13.1 12.3 15.1 26.1 18.2 26.2  Hip extension        Hip abduction 11.5 10.1 13.7 24.4 15.9 24  Hip adduction        Hip internal rotation        Hip external rotation        Knee flexion        Knee extension 14.0 13.3 17.5 33.3 20 38.2  Ankle dorsiflexion        Ankle plantarflexion        Ankle inversion        Ankle eversion         (Blank rows = not tested)  FUNCTIONAL TESTS:  5 times sit to stand: 28 sec  5/15 5 times sit to stand test 20 seconds  3-minute walk test 150 feet without requirement of seated rest break 5/15  GAIT:  TODAY'S TREATMENT:                                                                                                                              DATE:  6/5 Trigger Point Dry-Needling  Treatment instructions: Expect mild to moderate muscle soreness. S/S of pneumothorax if dry needled over a lung field, and to seek immediate medical attention should they occur. Patient verbalized understanding of these instructions and education.  Patient Consent Given: Yes Education handout provided: Yes Muscles treated: Right gluteal 3 spots using 0.30 X75 needle left gluteal 2 spots using a 0.30 x 75 needle Treatment response/outcome: Good twitch response; immediate pain relief.   Supine march 2 x 20 Supine hip abduction 2 x 20 red Short arc quad 2 x 20 bilateral  Standing weight shift x 20 2 inch step up 2 x 10 forward 2 x 10 lateral  5/29 Trigger Point Dry-Needling  Treatment  instructions: Expect mild to moderate muscle soreness. S/S of pneumothorax if dry needled over a lung field, and to seek immediate medical attention should they occur. Patient verbalized understanding of these instructions and education.  Patient Consent Given: Yes Education handout provided: Yes Muscles treated: Right gluteal 3 spots using 0.30 X75 needle left gluteal 2 spots using a 0.30 x 75 needle Treatment response/outcome: Good twitch response; immediate pain relief.  Supine march 2 x 20 Supine hip abduction 2 x 20 red Short arc quad 2 x 20 bilateral  Manual: Roller to lateral hip, roller to anterior hip, trigger point release  to bilateral gluteal, trigger point release to lumbar spine.  Reviewed technique for lifting in the garden.  Therapy advised patient that a split stance might be a easier way for her to be able to reach down without putting so much pressure on her back or affecting her balance.  Reviewed a nice wide base of support stance.  She uses a bucket to sit on in the garden.  We talked to her about making sure that she can get her feet underneath her to be able to stand up easier off this bucket.  5/23 Trigger Point Dry-Needling  Treatment instructions: Expect mild to moderate muscle soreness. S/S of pneumothorax if dry needled over a lung field, and to seek immediate medical attention should they occur. Patient verbalized understanding of these instructions and education.  Patient Consent Given: Yes Education handout provided: Yes Muscles treated: Right gluteal 3 spots using 0.30 X75 needle left gluteal 2 spots using a 0.30 x 75 needle Treatment response/outcome: Good twitch response; immediate pain relief.   NuStep level 3 5 minutes  Manual: Roller to lateral hip, roller to anterior hip, trigger point release to bilateral gluteal, trigger point release to lumbar spine.  Supine march 2 x 20 Supine hip abduction 2 x 20 red Short arc quad 2 x 20 bilateral  Standing  weight shift x 20 2 inch step up 2 x 10 forward 2 x 10 lateral  5/15 Trigger Point Dry-Needling  Treatment instructions: Expect mild to moderate muscle soreness. S/S of pneumothorax if dry needled over a lung field, and to seek immediate medical attention should they occur. Patient verbalized understanding of these instructions and education.  Patient Consent Given: Yes Education handout provided: Yes Muscles treated: Right gluteal 3 spots using 0.30 X75 needle left gluteal 2 spots using a 0.30 x 75 needle Treatment response/outcome: Good twitch response; immediate pain relief.   NuStep level 3 5 minutes  Manual: Roller to lateral hip, roller to anterior hip, trigger point release to bilateral gluteal, trigger point release to lumbar spine.  LTR x 20 Supine march 2 x 20 Hip abduction green 2 x 20 Long arc quad red 2 x 20 each leg Standing heel raise/weight shift x 20     PATIENT EDUCATION:  Education details: reviewed HEP, symptom management  Person educated: Patient Education method: Explanation, Demonstration, Tactile cues, Verbal cues, and Handouts Education comprehension: verbalized understanding, returned demonstration, verbal cues required, tactile cues required, and needs further education  HOME EXERCISE PROGRAM: Has previous HEP. Will review next visit  ASSESSMENT:  CLINICAL IMPRESSION: The patient continues to make progress. 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 She still has pain in her right anterior hip but it is better. We will continue to progress as tolerated.    OBJECTIVE IMPAIRMENTS: Abnormal gait, decreased activity tolerance, decreased balance, decreased endurance, decreased knowledge of use of DME, decreased mobility, difficulty walking, decreased ROM, decreased strength, increased muscle spasms, and pain.   ACTIVITY LIMITATIONS: carrying, lifting, bending, standing, squatting, sleeping, stairs, transfers, and locomotion  level  PARTICIPATION LIMITATIONS: meal prep, cleaning, laundry, driving, shopping, community activity, and yard work  PERSONAL FACTORS: Right Hip replacement 2022, Low back Pain, MS, TIA October 2023; MI, November 2022; CAD, Sjorgens syndrome are also affecting patient's functional outcome. MS  REHAB POTENTIAL: Good  CLINICAL DECISION MAKING: Evolving/moderate complexity declining mobility   EVALUATION COMPLEXITY: Moderate   GOALS: Goals reviewed with patient? No  SHORT TERM GOALS: Target date: 09/29/2022    Patient will  increase gross bilateral lower extremity strength by 5 pounds Baseline: Goal status: progressing 5/15  2.  Patient will increase lumbar flexion by 10 degrees Baseline:  Goal status:  mild improvement but still painful   3.  Patient will be independent with basic after exercise program and stretching program Baseline:  Goal status: has basic HEP    LONG TERM GOALS: Target date: 09/29/2022     Patient will stand for greater than 30 minutes without increased pain Baseline:  Goal status: improving 5/15   2.  Patient will pick item up off the floor without increased low back pain Baseline:  Goal status:still having pain 5/15  3.  Patient will ambulate community distances without report of increased low back pain Baseline:  Goal status: INITIAL  4.  Patient will report improved ability to sleep through the night secondary to back pain Baseline:  Goal status: INITIAL    PLAN:  PT FREQUENCY: 1-2x/week  PT DURATION: 16 weeks   PLANNED INTERVENTIONS: Therapeutic exercises, Therapeutic activity, Neuromuscular re-education, Balance training, Gait training, Patient/Family education, Self Care, Joint mobilization, Stair training, DME instructions, Aquatic Therapy, Dry Needling, Spinal mobilization, Cryotherapy, Moist heat, Ionotophoresis 4mg /ml Dexamethasone, and Manual therapy.  PLAN FOR NEXT SESSION: Consider manual therapy to the back and hips,  consider trigger point dry needling to back and hips.  Reviewed basic HEP.  Begin active strengthening of hips and core.  Begin gait training and functional mobility training.   Dessie Coma, PT 10/28/2022, 2:12 PM

## 2022-12-31 ENCOUNTER — Ambulatory Visit (HOSPITAL_BASED_OUTPATIENT_CLINIC_OR_DEPARTMENT_OTHER): Payer: Medicare Other | Admitting: Physical Therapy

## 2022-12-31 ENCOUNTER — Encounter (HOSPITAL_BASED_OUTPATIENT_CLINIC_OR_DEPARTMENT_OTHER): Payer: Self-pay | Admitting: Physical Therapy

## 2022-12-31 DIAGNOSIS — M5459 Other low back pain: Secondary | ICD-10-CM

## 2022-12-31 DIAGNOSIS — R262 Difficulty in walking, not elsewhere classified: Secondary | ICD-10-CM | POA: Diagnosis not present

## 2022-12-31 DIAGNOSIS — M6281 Muscle weakness (generalized): Secondary | ICD-10-CM

## 2022-12-31 DIAGNOSIS — R252 Cramp and spasm: Secondary | ICD-10-CM | POA: Diagnosis not present

## 2022-12-31 NOTE — Therapy (Signed)
OUTPATIENT PHYSICAL THERAPY THORACOLUMBAR Treatment    Patient Name: Dawn [name] MRN: [id number] DOB:Sep 28, 1945, 77 y.o., female Today's Date: 12/31/2022  END OF SESSION:  PT End of Session - 12/31/22 1023     Visit Number 20    Number of Visits 26    Date for PT Re-Evaluation 02/11/23    Authorization Type progress note done at visit 16 next at 26    PT Start Time 1015    PT Stop Time 1056    PT Time Calculation (min) 41 min    Activity Tolerance Patient tolerated treatment well    Behavior During Therapy [facility] for tasks assessed/performed               Past Medical History:  Diagnosis Date   Allergy    CAD (coronary artery disease)    s/p DES to RCA in 06/2021   Dry eyes    Endometrial polyp    Essential hypertension 08/25/2018   GERD (gastroesophageal reflux disease)    History of hiatal hernia    History of kidney stones    Hot flashes, menopausal 10/13/2011   Estradiol started    Hyperlipidemia    Jaundice as teenager   no problems since   Memory loss 09/21/2019     1/2 of feet numb all the time   Multiple sclerosis (HCC) dx 2001   Neuropathy    bilateral feet   Osteoarthritis    Sjogren's syndrome (HCC) dx oct 2021   sore muscvles, dry mouth and eyes   Stroke (HCC)    Vertigo    Vision abnormalities    Past Surgical History:  Procedure Laterality Date   ABDOMINAL HYSTERECTOMY  2002   partial   COLONOSCOPY  01/27/2022   2 day prep   colonscopy  2011   CORONARY STENT INTERVENTION N/A 07/18/2021   Procedure: CORONARY STENT INTERVENTION;  Surgeon: Corky Crafts, MD;  Location: [facility];  Service: Cardiovascular;  Laterality: N/A;   DILATATION & CURETTAGE/HYSTEROSCOPY WITH MYOSURE N/A 06/08/2020   Procedure: DILATATION & CURETTAGE/HYSTEROSCOPY/Polypectomy WITH MYOSURE;  Surgeon: Toy Baker, DO;  Location: [facility];  Service: Gynecology;  Laterality: N/A;   LEFT HEART CATH AND CORONARY ANGIOGRAPHY N/A  07/18/2021   Procedure: LEFT HEART CATH AND CORONARY ANGIOGRAPHY;  Surgeon: Corky Crafts, MD;  Location: [facility];  Service: Cardiovascular;  Laterality: N/A;   LUMBAR FUSION  2001   TOTAL HIP ARTHROPLASTY Right 01/17/2021   Procedure: TOTAL HIP ARTHROPLASTY ANTERIOR APPROACH;  Surgeon: Samson Frederic, MD;  Location: [facility];  Service: Orthopedics;  Laterality: Right;   UPPER GI ENDOSCOPY  yrs ago   Patient Active Problem List   Diagnosis Date Noted   Positive colorectal cancer screening using Cologuard test 11/14/2021   Coronary artery disease    Statin intolerance 04/19/2021   Osteoarthritis of right hip 01/17/2021   Status post THR (total hip replacement) 01/17/2021   Chronic bilateral low back pain without sciatica 10/02/2020   Chronic pain syndrome 06/20/2020   Post laminectomy syndrome 06/20/2020   Sjogren's disease (HCC) 05/23/2020   Primary osteoarthritis of both hands 05/23/2020   Primary osteoarthritis of both feet 05/23/2020   Other secondary scoliosis, lumbar region 12/22/2019   Spondylosis without myelopathy or radiculopathy, lumbar region 12/22/2019   Cervical radiculopathy 10/18/2019   Numbness 09/21/2019   Bilateral carpal tunnel syndrome 09/21/2019   High risk medication use 09/21/2019   Memory loss 09/21/2019   Essential hypertension  08/25/2018   Abnormal SPEP 02/19/2018   Hand pain 02/20/2017   Multiple joint pain 02/18/2017   Disturbed cognition 06/25/2016   Sciatica, right side 10/30/2015   Chronic fatigue 08/04/2014   Gait disturbance 08/04/2014   Unspecified visual disturbance 01/31/2013   Transient vision disturbance 01/31/2013   Colon polyps 11/03/2011   POSTHERPETIC NEURALGIA 10/03/2009   DISPLCMT LUMBAR INTERVERT DISC W/O MYELOPATHY 09/05/2009   RENAL CALCULUS, RECURRENT 09/04/2009   Hyperlipidemia 08/31/2009   Multiple sclerosis (HCC) 08/31/2009   ALLERGIC RHINITIS 08/31/2009  Progress Note Reporting Period 10/14/2022 to  12/03/2022  See note below for Objective Data and Assessment of Progress/Goals.     [name] Kitchen     PCP: Iantha Fallen NP  REFERRING PROVIDER: Dr Romero Belling   REFERRING DIAG: Low Back Pain   Rationale for Evaluation and Treatment: Rehabilitation  THERAPY DIAG:  Difficulty in walking, not elsewhere classified  Cramp and spasm  Other low back pain  Muscle weakness (generalized)  ONSET DATE: About 11 days ago her nerve abiliation wore off.   SUBJECTIVE:                                                                                                                                                                                           SUBJECTIVE STATEMENT: The patient report a minor increase in back pain. She continues to work in the pool with the exercises we had been doing.   PERTINENT HISTORY:  Right Hip replacement 2022, Low back Pain, MS, TIA October 2023; MI, November 2022; CAD, Sjorgens syndrome  PAIN:  Are you having pain? Yes: NPRS scale: 2/10 Pain location: right hip  /lumbar spine Pain description: aching  Aggravating factors: standing and walking  Relieving factors: rest    PRECAUTIONS: None  WEIGHT BEARING RESTRICTIONS: No  FALLS:  Has patient fallen in last 6 months? 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   LUMBAR ROM:   AROM eval 5/15  Flexion Limited 50 % with pain  Mild improvement but still about 50%   Extension No limit    Right lateral flexion    Left lateral flexion    Right rotation Limited 50% with pain    Left rotation Limited 50% with pain     (Blank rows = not tested)  LOWER EXTREMITY ROM:     Passive  Right eval Left eval Right   Hip flexion 105 90 105  Hip extension   10 degrees from neutral before manual today. Too neutral after stretching   Hip abduction     Hip adduction     Hip internal rotation painful Painful  Painful   Hip external rotation   20 degrees with pain   Knee flexion     Knee extension     Ankle dorsiflexion     Ankle plantarflexion     Ankle inversion     Ankle eversion      (Blank rows = not tested)  LOWER EXTREMITY MMT:    MMT Right eval Left eval Right  3/26 Left  3/26 Right 5/15 Left 5/15  Hip flexion 13.1 12.3 15.1 26.1 18.2 26.2  Hip extension        Hip abduction 11.5 10.1 13.7 24.4 15.9 24  Hip adduction        Hip internal rotation        Hip external rotation        Knee flexion        Knee extension 14.0 13.3 17.5 33.3 20 38.2  Ankle dorsiflexion        Ankle plantarflexion        Ankle inversion        Ankle eversion         (Blank rows = not tested)  FUNCTIONAL TESTS:  5 times sit to stand: 28 sec  5/15 5 times sit to stand test 20 seconds  3-minute walk test 150 feet without requirement of seated rest break 5/15  GAIT:  TODAY'S TREATMENT:                                                                                                                              DATE:  6/12 Trigger Point Dry-Needling  Treatment instructions: Expect mild to moderate muscle soreness. S/S of pneumothorax if dry needled over a lung field, and to seek immediate medical attention should they occur. Patient verbalized understanding of these instructions and education.  Patient Consent Given: Yes Education  handout provided: Yes Muscles treated: Right gluteal 3 spots using 0.30 X75 needle left gluteal 2 spots using a 0.30 x 75 needle Treatment response/outcome: Good twitch response; immediate pain relief.   Supine march 2 x 20 Supine hip abduction 2 x 20 red Short arc quad 2 x 20 bilateral  Standing weight shift x 20 2 inch step up 2 x 10 forward 2 x 10 lateral    6/5 Trigger Point Dry-Needling  Treatment instructions: Expect mild to moderate muscle soreness. S/S of pneumothorax if dry needled over a lung field, and to seek immediate medical attention should they occur. Patient verbalized understanding of these instructions and education.  Patient Consent Given: Yes Education handout provided: Yes Muscles treated: Right gluteal 3 spots using 0.30 X75 needle left gluteal 2 spots using a 0.30 x 75 needle Treatment response/outcome: Good twitch response; immediate pain relief.   Supine march 2 x 20 Supine hip abduction 2 x 20 red Short arc quad 2 x 20 bilateral  Standing weight shift x 20 2 inch step up 2 x 10 forward 2 x 10 lateral    PATIENT EDUCATION:  Education details: reviewed HEP, symptom management  Person educated: Patient Education method: Explanation, Demonstration, Tactile cues, Verbal cues, and Handouts Education comprehension: verbalized understanding, returned demonstration, verbal cues required, tactile cues required, and needs further education  HOME EXERCISE PROGRAM: Has previous HEP. Will review next visit  ASSESSMENT:  CLINICAL IMPRESSION: The patient continues to respond well to needling. She tolerated there-ex well following needling. We continue to try to progress weight bearing activity.    OBJECTIVE IMPAIRMENTS: Abnormal gait, decreased activity tolerance, decreased balance, decreased endurance, decreased knowledge of use of DME, decreased mobility, difficulty walking, decreased ROM, decreased strength, increased muscle spasms, and pain.   ACTIVITY  LIMITATIONS: carrying, lifting, bending, standing, squatting, sleeping, stairs, transfers, and locomotion level  PARTICIPATION LIMITATIONS: meal prep, cleaning, laundry, driving, shopping, community activity, and yard work  PERSONAL FACTORS: Right Hip replacement 2022, Low back Pain, MS, TIA October 2023; MI, November 2022; CAD, Sjorgens syndrome are also affecting patient's functional outcome. MS  REHAB POTENTIAL: Good  CLINICAL DECISION MAKING: Evolving/moderate complexity declining mobility   EVALUATION COMPLEXITY: Moderate   GOALS: Goals reviewed with patient? No  SHORT TERM GOALS: Target date: 09/29/2022    Patient will increase gross bilateral lower extremity strength by 5 pounds Baseline: Goal status: progressing 5/15  2.  Patient will increase lumbar flexion by 10 degrees Baseline:  Goal status:  mild improvement but still painful   3.  Patient will be independent with basic after exercise program and stretching program Baseline:  Goal status: has basic HEP    LONG TERM GOALS: Target date: 09/29/2022     Patient will stand for greater than 30 minutes without increased pain Baseline:  Goal status: improving 5/15   2.  Patient will pick item up off the floor without increased low back pain Baseline:  Goal status:still having pain 5/15  3.  Patient will ambulate community distances without report of increased low back pain Baseline:  Goal status: INITIAL  4.  Patient will report improved ability to sleep through the night secondary to back pain Baseline:  Goal status: INITIAL    PLAN:  PT FREQUENCY: 1-2x/week  PT DURATION: 16 weeks   PLANNED INTERVENTIONS: Therapeutic exercises, Therapeutic activity, Neuromuscular re-education, Balance training, Gait training, Patient/Family education, Self Care, Joint mobilization, Stair training, DME instructions, Aquatic Therapy, Dry Needling, Spinal mobilization, Cryotherapy, Moist heat, Ionotophoresis 4mg /ml  Dexamethasone, and Manual therapy.  PLAN FOR NEXT SESSION: Consider manual therapy to the back and hips, consider trigger point dry needling to back and hips.  Reviewed basic HEP.  Begin active strengthening of hips and core.  Begin gait training and functional mobility training.   Dessie Coma, PT 10/28/2022, 10:48 AM

## 2023-01-06 ENCOUNTER — Ambulatory Visit (INDEPENDENT_AMBULATORY_CARE_PROVIDER_SITE_OTHER): Payer: Medicare Other | Admitting: Family

## 2023-01-06 ENCOUNTER — Encounter (HOSPITAL_BASED_OUTPATIENT_CLINIC_OR_DEPARTMENT_OTHER): Payer: Self-pay | Admitting: Family

## 2023-01-06 VITALS — BP 126/70 | HR 78 | Ht 62.0 in | Wt 182.0 lb

## 2023-01-06 DIAGNOSIS — I10 Essential (primary) hypertension: Secondary | ICD-10-CM

## 2023-01-06 DIAGNOSIS — E785 Hyperlipidemia, unspecified: Secondary | ICD-10-CM

## 2023-01-06 DIAGNOSIS — Z8673 Personal history of transient ischemic attack (TIA), and cerebral infarction without residual deficits: Secondary | ICD-10-CM

## 2023-01-06 MED ORDER — HYDRALAZINE HCL 25 MG PO TABS: 25.0000 mg | ORAL_TABLET | Freq: Two times a day (BID) | ORAL | 3 refills | Status: DC

## 2023-01-06 NOTE — Patient Instructions (Addendum)
Medication Instructions:  Your physician has recommended you make the following change in your medication:   REDUCE Hydralazine to 25mg  twice per day  HOLD Rosuvastatin for two weeks We will call you in two weeks to see if your myalgias are improved at all  May take Famotidine 20mg  (Pepcid) up to twice per day as needed for breakthrough acid reflux  *If you need a refill on your cardiac medications before your next appointment, please call your pharmacy*  Testing/Procedures: Your EKG today showed normal sinus rhythm which is a good result.   Follow-Up: At [facility], you and your health needs are our priority.  As part of our continuing mission to provide you with exceptional heart care, we have created designated Provider Care Teams.  These Care Teams include your primary Cardiologist (physician) and Advanced Practice Providers (APPs -  Physician Assistants and Nurse Practitioners) who all work together to provide you with the care you need, when you need it.  We recommend signing up for the patient portal called "MyChart".  Sign up information is provided on this After Visit Summary.  MyChart is used to connect with patients for Virtual Visits (Telemedicine).  Patients are able to view lab/test results, encounter notes, upcoming appointments, etc.  Non-urgent messages can be sent to your provider as well.   To learn more about what you can do with MyChart, go to [email or web address].    Your next appointment:   As scheduled with Dr. Duke Salvia  Other Instructions  Heart Healthy Diet Recommendations: A low-salt diet is recommended. Meats should be grilled, baked, or boiled. Avoid fried foods. Focus on lean protein sources like fish or chicken with vegetables and fruits. The American Heart Association is a Chief Technology Officer!  American Heart Association Diet and Lifeystyle Recommendations   Exercise recommendations: The American Heart Association recommends 150 minutes of  moderate intensity exercise weekly. Try 30 minutes of moderate intensity exercise 4-5 times per week. This could include walking, jogging, or swimming.  To prevent palpitations: Make sure you are adequately hydrated.  Avoid and/or limit caffeine containing beverages like soda or tea. Exercise regularly.  Manage stress well. Some over the counter medications can cause palpitations such as Benadryl, AdvilPM, TylenolPM. Regular Advil or Tylenol do not cause palpitations.

## 2023-01-06 NOTE — Progress Notes (Signed)
Cardiology Office Note:  .   Date:  01/06/2023  ID:  Dawn [name], DOB [birth year], MRN [id number] PCP: Caesar Bookman, NP  [facility] Providers Cardiologist:  Maisie Fus, MD    History of Present Illness: [name] Kitchen   Dawn [name] is a 77 y.o. female MS, Sjogren's, prior CVA, HLD, hypertension, coronary artery disease  MRI 04/2022 with subacute stroke in left posterior frontal lobe. Subsequent carotid duplex, monitor unremarkable. Admitted with hypertensive urgency 06/14/2021.  Echo 05/2021 LVEF 60 to 65%, no RWMA, mild LVH, grade 1 diastolic dysfunction, mild MR, mild to moderate mitral annular calcification, mild calcification of aortic valve.  Subsequent CTA 07/09/2021 with severe stenosis with subsequent LHC 07/18/2021 with PCI-mid RAD.  Otherwise nonobstructive disease (25% LAD, 25% LCx, L-R collaterals).  She presents today for follow up independently. Reports no shortness of breath nor dyspnea on exertion. Reports no chest pain, pressure, or tightness. No edema, orthopnea, PND. Reports occasionally her heart will race which she attributes to Crestor. Reports episodes of lightheadedness which is different than her prior vertigo. Most often around 3pm which is when she takes her Hydralazine, Crestor. She has had a couple of falls per her report which were precipitate by lightheadedness. No near syncope, syncope.  She also reports "achiness" and is concerned whether Crestor would be contributory to symptoms.   Usually eats breakfast (oatmeal, fruit), lunch only sometimes, and then dinner. Does note she tries to eat snacks regularly. Drinks water or lemon water throughout the day at least 2-3 of her large cups. She checks BP at home once per week with readings 120s/70s.   She is concerned regarding weight. She lost weight and then it has gone back up. Does note steroid shots for orthopedic pain and these often make her more hungry. Interested in [employer].   Medication  schedule:  7A Carvedilol, Losartan, Hydralazine 3P Hydralazine, Crestor 7P Carvedilol 11P Hydralazine  ROS: Please see the history of present illness.    All other systems reviewed and are negative.   Studies Reviewed: .        Cardiac Studies & Procedures   CARDIAC CATHETERIZATION  CARDIAC CATHETERIZATION 07/18/2021  Narrative   Mid Cx lesion is 25% stenosed.   Prox LAD to Mid LAD lesion is 25% stenosed.   Prox RCA to Mid RCA lesion is 99% stenosed.  Left-to-right collaterals.   A drug-eluting stent was successfully placed using a STENT ONYX FRONTIER 3.0X30, postdilated to 3.5 mm.   Post intervention, there is a 0% residual stenosis.   The left ventricular systolic function is normal.   LV end diastolic pressure is normal.   The left ventricular ejection fraction is 55-65% by visual estimate.   There is no aortic valve stenosis.  Successful PCI of the mid RCA.  Brisk left to right collaterals.  It is likely that she had a ruptured plaque in November and developed collaterals since that time.  Continue aggressive secondary prevention.  Plavix was used for antiplatelet therapy in the Cath Lab.  We will need to see with her other multiple sclerosis medicines, if Brilinta may be a better choice due to drug interactions.  We will also have to check on which statin would not cause drug interaction.  Of note, she had significant right radial vasospasm.  This required additional verapamil and nitroglycerin in the radial artery.  We also used a heating pad on her right forearm.  She required 4 mg of Versed and 100 mg  of fentanyl for sedation and this also seemed to help with the vasospasm.  Addendum: After review with the Pharm.D., Brilinta would be a better choice than clopidogrel.  We will send her home with a supply of Brilinta after 180 mg loading dose.  Findings Coronary Findings Diagnostic  Dominance: Right  Left Anterior Descending Prox LAD to Mid LAD lesion is 25%  stenosed.  Left Circumflex Mid Cx lesion is 25% stenosed.  Right Coronary Artery Prox RCA to Mid RCA lesion is 99% stenosed. Brisk left to right collaterals.  Third Right Posterolateral Branch Collaterals 3rd RPL filled by collaterals from Dist Cx.  Intervention  Prox RCA to Mid RCA lesion Stent CATH LAUNCHER 6FR JR4 guide catheter was inserted. Lesion crossed with guidewire using a WIRE ASAHI PROWATER 180CM. Pre-stent angioplasty was performed using a BALLN SAPPHIRE 2.0X15. A drug-eluting stent was successfully placed using a STENT ONYX FRONTIER 3.0X30. Stent strut is well apposed. Post-stent angioplasty was performed using a BALLN SAPPHIRE [place] 3.5X15. Post-Intervention Lesion Assessment The intervention was successful. Pre-interventional TIMI flow is 1. Post-intervention TIMI flow is 3. No complications occurred at this lesion. There is a 0% residual stenosis post intervention.     ECHOCARDIOGRAM  ECHOCARDIOGRAM COMPLETE 06/15/2021  Narrative ECHOCARDIOGRAM REPORT    Patient Name:   Dawn [name] Date of Exam: 06/15/2021 Medical Rec #:  [id number]         Height:       62.0 in Accession #:    [id number]        Weight:       182.0 lb Date of Birth:  25-Oct-1945          BSA:          1.837 m Patient Age:    75 years          BP:           136/68 mmHg Patient Gender: F                 HR:           70 bpm. Exam Location:  Inpatient  Procedure: 2D Echo, Cardiac Doppler and Color Doppler  Indications:    Chest pain  History:        Patient has no prior history of Echocardiogram examinations. Signs/Symptoms:Chest Pain and Dizziness/Lightheadedness; Risk Factors:Hypertension. Vertigo. Arm numbness.  Sonographer:    Roosvelt Maser RDCS Referring Phys: [id number] CURTIS J WOODS  IMPRESSIONS   1. Left ventricular ejection fraction, by estimation, is 60 to 65%. The left ventricle has normal function. The left ventricle has no regional wall motion abnormalities. There is mild  left ventricular hypertrophy. Left ventricular diastolic parameters are consistent with Grade I diastolic dysfunction (impaired relaxation). 2. Right ventricular systolic function is normal. The right ventricular size is normal. Tricuspid regurgitation signal is inadequate for assessing PA pressure. 3. The mitral valve is grossly normal. Mild mitral valve regurgitation. No evidence of mitral stenosis. 4. The aortic valve is grossly normal. There is mild calcification of the aortic valve. Aortic valve regurgitation is not visualized. No aortic stenosis is present. 5. The inferior vena cava is normal in size with greater than 50% respiratory variability, suggesting right atrial pressure of 3 mmHg.  FINDINGS Left Ventricle: Left ventricular ejection fraction, by estimation, is 60 to 65%. The left ventricle has normal function. The left ventricle has no regional wall motion abnormalities. The left ventricular internal cavity size was normal in size. There is mild left ventricular  hypertrophy. Left ventricular diastolic parameters are consistent with Grade I diastolic dysfunction (impaired relaxation).  Right Ventricle: The right ventricular size is normal. No increase in right ventricular wall thickness. Right ventricular systolic function is normal. Tricuspid regurgitation signal is inadequate for assessing PA pressure.  Left Atrium: Left atrial size was normal in size.  Right Atrium: Right atrial size was normal in size.  Pericardium: There is no evidence of pericardial effusion. Presence of epicardial fat layer.  Mitral Valve: The mitral valve is grossly normal. Mild to moderate mitral annular calcification. Mild mitral valve regurgitation. No evidence of mitral valve stenosis.  Tricuspid Valve: The tricuspid valve is normal in structure. Tricuspid valve regurgitation is trivial. No evidence of tricuspid stenosis.  Aortic Valve: The aortic valve is grossly normal. There is mild calcification of  the aortic valve. Aortic valve regurgitation is not visualized. No aortic stenosis is present. Aortic valve mean gradient measures 4.0 mmHg. Aortic valve peak gradient measures 6.9 mmHg. Aortic valve area, by VTI measures 2.11 cm.  Pulmonic Valve: The pulmonic valve was normal in structure. Pulmonic valve regurgitation is not visualized. No evidence of pulmonic stenosis.  Aorta: The aortic root is normal in size and structure. There is atheroma plaque involving the aortic arch and descending aorta.  Venous: The inferior vena cava is normal in size with greater than 50% respiratory variability, suggesting right atrial pressure of 3 mmHg.  IAS/Shunts: No atrial level shunt detected by color flow Doppler.   LEFT VENTRICLE PLAX 2D LVIDd:         4.60 cm     Diastology LVIDs:         3.00 cm     LV e' medial:    7.51 cm/s LV PW:         1.10 cm     LV E/e' medial:  10.5 LV IVS:        1.10 cm     LV e' lateral:   9.46 cm/s LVOT diam:     1.80 cm     LV E/e' lateral: 8.3 LV SV:         64 LV SV Index:   35 LVOT Area:     2.54 cm  LV Volumes (MOD) LV vol d, MOD A4C: 84.7 ml LV vol s, MOD A4C: 25.2 ml LV SV MOD A4C:     84.7 ml  RIGHT VENTRICLE          IVC RV Basal diam:  4.00 cm  IVC diam: 1.90 cm  LEFT ATRIUM             Index        RIGHT ATRIUM           Index LA diam:        3.70 cm 2.01 cm/m   RA Area:     11.70 cm LA Vol (A2C):   51.3 ml 27.93 ml/m  RA Volume:   22.40 ml  12.20 ml/m LA Vol (A4C):   39.5 ml 21.51 ml/m LA Biplane Vol: 48.7 ml 26.52 ml/m AORTIC VALVE AV Area (Vmax):    2.08 cm AV Area (Vmean):   2.11 cm AV Area (VTI):     2.11 cm AV Vmax:           131.00 cm/s AV Vmean:          89.800 cm/s AV VTI:            0.303 m AV Peak Grad:  6.9 mmHg AV Mean Grad:      4.0 mmHg LVOT Vmax:         107.00 cm/s LVOT Vmean:        74.400 cm/s LVOT VTI:          0.251 m LVOT/AV VTI ratio: 0.83  AORTA Ao Root diam: 3.10 cm  MITRAL VALVE MV Area  (PHT): 2.76 cm     SHUNTS MV Decel Time: 275 msec     Systemic VTI:  0.25 m MV E velocity: 78.60 cm/s   Systemic Diam: 1.80 cm MV A velocity: 124.00 cm/s MV E/A ratio:  0.63  Weston Brass MD Electronically signed by Weston Brass MD Signature Date/Time: 06/15/2021/1:25:52 PM    Final    MONITORS  CARDIAC EVENT MONITOR 07/18/2022  Narrative No significant tachyarrhythmia or bradyarrhythmia. No atrial fibrillation or flutter.   CT SCANS  CT CORONARY MORPH W/CTA COR W/SCORE 07/19/2021  Addendum 07/19/2021  9:30 AM ADDENDUM REPORT: 07/19/2021 09:28  EXAM: OVER-READ INTERPRETATION  CT CHEST  The following report is an over-read performed by radiologist Dr. Marinda Elk [facility] on 07/19/2021. This over-read does not include interpretation of cardiac or coronary anatomy or pathology. The coronary CTA interpretation by the cardiologist is attached.  COMPARISON:  None.  FINDINGS: Atherosclerosis of the thoracic aorta. Visualized mediastinum and hilar regions demonstrate no lymphadenopathy or masses. There is a small hiatal hernia. Visualized lungs show no evidence of pulmonary edema, consolidation, pneumothorax, nodule or pleural fluid. Visualized upper abdomen and bony structures are unremarkable.  IMPRESSION: 1. Atherosclerosis of the thoracic aorta. 2. Small hiatal hernia.   Electronically Signed By: Irish Lack M.D. On: 07/19/2021 09:28  Narrative CLINICAL DATA:  77 Year old White Female  EXAM: Cardiac/Coronary  CTA  TECHNIQUE: The patient was scanned on a [employer].  FINDINGS: Scan was triggered in the descending thoracic aorta. Axial non-contrast 3 mm slices were carried out through the heart. The data set was analyzed on a dedicated work station and scored using the Agatson method. Gantry rotation speed was 250 msecs and collimation was .6 mm. 0.8 mg of sl NTG was given. The 3D data set was reconstructed  in 5% intervals of the 67-82 % of the R-R cycle. Diastolic phases were analyzed on a dedicated work station using MPR, MIP and VRT modes. The patient received 95 cc of contrast.  Aorta: Normal size. Scattered aortic calcifications. No dissection.  Main Pulmonary Artery: Normal size of the pulmonary artery.  Aortic Valve:  Tri-leaflet.  Aortic Valve calcium score 664.  Coronary Arteries:  Normal coronary origin.  Right dominance.  Coronary Calcium Score:  Left main: 12  Left anterior descending artery: 98  Left circumflex artery: 24  Right coronary artery: 7  Total: 141  Percentile: 71st for age, sex, and race matched control.  RCA is a large dominant artery that gives rise to PDA and PLA. There is a severe soft plaque stenosis on the proximal vessel.  Left main is a large artery that gives rise to LAD and LCX arteries. Minimal non obstructive distal calcified plaque  LAD is a large vessel that gives rise to one large D1 Branch. There is a moderate stenosis mixed plaque in the proximal vessel.  LCX is a non-dominant artery. Mild non obstructive plaque in the proximal vessel.  Other findings:  Normal variant pulmonary vein drainage into the left atrium- presence of left middle pulmonary vein.  Normal left atrial appendage without a thrombus.  Extra-cardiac findings: See attached radiology report for non-cardiac structures.  IMPRESSION: 1. Coronary calcium score of 141. This was 71st percentile for age, sex, and race matched control.  2. Normal coronary origin with right dominance.  3. CAD-RADS 4 Severe stenosis. (70-99% or > 50% left main). Cardiac catheterization or CT FFR is recommended. Consider symptom-guided anti-ischemic pharmacotherapy as well as risk factor modification per guideline directed care.  RECOMMENDATIONS:  Coronary artery calcium (CAC) score is a strong predictor of incident coronary heart disease (CHD) and provides  predictive information beyond traditional risk factors. CAC scoring is reasonable to use in the decision to withhold, postpone, or initiate statin therapy in intermediate-risk or selected borderline-risk asymptomatic adults (age 54-75 years and LDL-C >=70 to <190 mg/dL) who do not have diabetes or established atherosclerotic cardiovascular disease (ASCVD).* In intermediate-risk (10-year ASCVD risk >=7.5% to <20%) adults or selected borderline-risk (10-year ASCVD risk >=5% to <7.5%) adults in whom a CAC score is measured for the purpose of making a treatment decision the following recommendations have been made:  If CAC = 0, it is reasonable to withhold statin therapy and reassess in 5 to 10 years, as long as higher risk conditions are absent (diabetes mellitus, family history of premature CHD in first degree relatives (males <55 years; females <65 years), cigarette smoking, LDL >=190 mg/dL or other independent risk factors).  If CAC is 1 to 99, it is reasonable to initiate statin therapy for patients >=87 years of age.  If CAC is >=100 or >=75th percentile, it is reasonable to initiate statin therapy at any age.  Cardiology referral should be considered for patients with CAC scores =400 or >=75th percentile.  *2018 AHA/ACC/AACVPR/AAPA/ABC/ACPM/ADA/AGS/APhA/ASPC/NLA/PCNA Guideline on the Management of Blood Cholesterol: A Report of the American College of Cardiology/American Heart Association Task Force on Clinical Practice Guidelines. J Am Coll Cardiol. 2019;73(24):3168-3209.  Riley Lam, MD  Electronically Signed: By: Riley Lam M.D. On: 07/09/2021 14:57          Risk Assessment/Calculations:             Physical Exam:   VS:  BP 126/70   Pulse 78   Ht 5\' 2"  (1.575 m)   Wt 182 lb (82.6 kg)   BMI 33.29 kg/m    Wt Readings from Last 3 Encounters:  01/06/23 182 lb (82.6 kg)  12/26/22 182 lb 3.2 oz (82.6 kg)  10/27/22 183 lb 3.2 oz (83.1 kg)     GEN: Well nourished, well developed in no acute distress NECK: No JVD; No carotid bruits CARDIAC: RRR, no murmurs, rubs, gallops RESPIRATORY:  Clear to auscultation without rales, wheezing or rhonchi  ABDOMEN: Soft, non-tender, non-distended EXTREMITIES:  No edema; No deformity   ASSESSMENT AND PLAN: .     CAD s/p PCI RCA / HLD, LDL goal <70- Stable with no anginal symptoms. No indication for ischemic evaluation.  GDMT aspirin, carvedilol, rosuvastatin.  Due to persistent myalgias plan for 2-week statin holiday.  Phone call in 2 weeks to check in.  If myalgias improved plan to use alternate lipid-lowering agent.  If not improved, unlikely related statin will resume. Refer to PREP exercise program.  History of CVA- Continue aspirin. Statin holiday, as above.   HTN- Episodes of lightheadedness Durning for hypotension.  Will reduce hydralazine from 25 3 times daily to twice daily.  Continue losartan 100 mg daily, Coreg 6.25mg  BID. Discussed to monitor BP at home at least 2 hours after medications and sitting for 5-10 minutes.  Dispo: As scheduled with Dr. Duke Salvia  Signed, [name], NP

## 2023-01-07 ENCOUNTER — Ambulatory Visit (HOSPITAL_BASED_OUTPATIENT_CLINIC_OR_DEPARTMENT_OTHER): Payer: Medicare Other | Admitting: Physical Therapy

## 2023-01-08 ENCOUNTER — Telehealth: Payer: Self-pay

## 2023-01-08 NOTE — Telephone Encounter (Signed)
VMT pt requesting call back to discuss starting PREP 

## 2023-01-14 ENCOUNTER — Encounter (HOSPITAL_BASED_OUTPATIENT_CLINIC_OR_DEPARTMENT_OTHER): Payer: Self-pay | Admitting: Physical Therapy

## 2023-01-14 ENCOUNTER — Ambulatory Visit (HOSPITAL_BASED_OUTPATIENT_CLINIC_OR_DEPARTMENT_OTHER): Payer: Medicare Other | Admitting: Physical Therapy

## 2023-01-14 DIAGNOSIS — M6281 Muscle weakness (generalized): Secondary | ICD-10-CM

## 2023-01-14 DIAGNOSIS — M533 Sacrococcygeal disorders, not elsewhere classified: Secondary | ICD-10-CM | POA: Diagnosis not present

## 2023-01-14 DIAGNOSIS — R252 Cramp and spasm: Secondary | ICD-10-CM | POA: Diagnosis not present

## 2023-01-14 DIAGNOSIS — R262 Difficulty in walking, not elsewhere classified: Secondary | ICD-10-CM | POA: Diagnosis not present

## 2023-01-14 DIAGNOSIS — M5459 Other low back pain: Secondary | ICD-10-CM | POA: Diagnosis not present

## 2023-01-14 NOTE — Therapy (Signed)
OUTPATIENT PHYSICAL THERAPY THORACOLUMBAR Treatment    Patient Name: Dawn [name] MRN: [id number] DOB:Sep 28, 1945, 77 y.o., female Today's Date: 12/31/2022  END OF SESSION:  PT End of Session - 12/31/22 1023     Visit Number 20    Number of Visits 26    Date for PT Re-Evaluation 02/11/23    Authorization Type progress note done at visit 16 next at 26    PT Start Time 1015    PT Stop Time 1056    PT Time Calculation (min) 41 min    Activity Tolerance Patient tolerated treatment well    Behavior During Therapy [facility] for tasks assessed/performed               Past Medical History:  Diagnosis Date   Allergy    CAD (coronary artery disease)    s/p DES to RCA in 06/2021   Dry eyes    Endometrial polyp    Essential hypertension 08/25/2018   GERD (gastroesophageal reflux disease)    History of hiatal hernia    History of kidney stones    Hot flashes, menopausal 10/13/2011   Estradiol started    Hyperlipidemia    Jaundice as teenager   no problems since   Memory loss 09/21/2019     1/2 of feet numb all the time   Multiple sclerosis (HCC) dx 2001   Neuropathy    bilateral feet   Osteoarthritis    Sjogren's syndrome (HCC) dx oct 2021   sore muscvles, dry mouth and eyes   Stroke (HCC)    Vertigo    Vision abnormalities    Past Surgical History:  Procedure Laterality Date   ABDOMINAL HYSTERECTOMY  2002   partial   COLONOSCOPY  01/27/2022   2 day prep   colonscopy  2011   CORONARY STENT INTERVENTION N/A 07/18/2021   Procedure: CORONARY STENT INTERVENTION;  Surgeon: Corky Crafts, MD;  Location: [facility];  Service: Cardiovascular;  Laterality: N/A;   DILATATION & CURETTAGE/HYSTEROSCOPY WITH MYOSURE N/A 06/08/2020   Procedure: DILATATION & CURETTAGE/HYSTEROSCOPY/Polypectomy WITH MYOSURE;  Surgeon: Toy Baker, DO;  Location: [facility];  Service: Gynecology;  Laterality: N/A;   LEFT HEART CATH AND CORONARY ANGIOGRAPHY N/A  07/18/2021   Procedure: LEFT HEART CATH AND CORONARY ANGIOGRAPHY;  Surgeon: Corky Crafts, MD;  Location: [facility];  Service: Cardiovascular;  Laterality: N/A;   LUMBAR FUSION  2001   TOTAL HIP ARTHROPLASTY Right 01/17/2021   Procedure: TOTAL HIP ARTHROPLASTY ANTERIOR APPROACH;  Surgeon: Samson Frederic, MD;  Location: [facility];  Service: Orthopedics;  Laterality: Right;   UPPER GI ENDOSCOPY  yrs ago   Patient Active Problem List   Diagnosis Date Noted   Positive colorectal cancer screening using Cologuard test 11/14/2021   Coronary artery disease    Statin intolerance 04/19/2021   Osteoarthritis of right hip 01/17/2021   Status post THR (total hip replacement) 01/17/2021   Chronic bilateral low back pain without sciatica 10/02/2020   Chronic pain syndrome 06/20/2020   Post laminectomy syndrome 06/20/2020   Sjogren's disease (HCC) 05/23/2020   Primary osteoarthritis of both hands 05/23/2020   Primary osteoarthritis of both feet 05/23/2020   Other secondary scoliosis, lumbar region 12/22/2019   Spondylosis without myelopathy or radiculopathy, lumbar region 12/22/2019   Cervical radiculopathy 10/18/2019   Numbness 09/21/2019   Bilateral carpal tunnel syndrome 09/21/2019   High risk medication use 09/21/2019   Memory loss 09/21/2019   Essential hypertension  08/25/2018   Abnormal SPEP 02/19/2018   Hand pain 02/20/2017   Multiple joint pain 02/18/2017   Disturbed cognition 06/25/2016   Sciatica, right side 10/30/2015   Chronic fatigue 08/04/2014   Gait disturbance 08/04/2014   Unspecified visual disturbance 01/31/2013   Transient vision disturbance 01/31/2013   Colon polyps 11/03/2011   POSTHERPETIC NEURALGIA 10/03/2009   DISPLCMT LUMBAR INTERVERT DISC W/O MYELOPATHY 09/05/2009   RENAL CALCULUS, RECURRENT 09/04/2009   Hyperlipidemia 08/31/2009   Multiple sclerosis (HCC) 08/31/2009   ALLERGIC RHINITIS 08/31/2009  Progress Note Reporting Period 10/14/2022 to  12/03/2022  See note below for Objective Data and Assessment of Progress/Goals.     PCP: Iantha Fallen NP  REFERRING PROVIDER: Dr Romero Belling   REFERRING DIAG: Low Back Pain   Rationale for Evaluation and Treatment: Rehabilitation  THERAPY DIAG:  Difficulty in walking, not elsewhere classified  Cramp and spasm  Other low back pain  Muscle weakness (generalized)  ONSET DATE: About 11 days ago her nerve abiliation wore off.   SUBJECTIVE:                                                                                                                                                                                           SUBJECTIVE STATEMENT: Over the past week the patient has had a significant increase in pain. Her back and hips are limiting her mobility. She has tried exeises and walking but have been unable to do much.  PERTINENT HISTORY:  Right Hip replacement 2022, Low back Pain, MS, TIA October 2023; MI, November 2022; CAD, Sjorgens syndrome  PAIN:  Are you having pain? Yes: NPRS scale: 2/10 Pain location: right hip  /lumbar spine Pain description: aching  Aggravating factors: standing and walking  Relieving factors: rest    PRECAUTIONS: None  WEIGHT BEARING RESTRICTIONS: No  FALLS:  Has patient fallen in last 6 months? 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   LUMBAR ROM:  AROM eval 5/15  Flexion Limited 50 % with pain  Mild improvement but still about 50%   Extension No limit    Right lateral flexion    Left lateral flexion    Right rotation Limited 50% with pain    Left rotation Limited 50% with pain     (Blank rows = not tested)  LOWER EXTREMITY ROM:     Passive  Right eval Left eval Right   Hip flexion 105 90 105  Hip extension   10 degrees from neutral before manual today. Too neutral after stretching   Hip abduction     Hip adduction     Hip internal rotation painful Painful  Painful   Hip external rotation   20 degrees with pain   Knee flexion     Knee extension     Ankle dorsiflexion     Ankle plantarflexion     Ankle inversion     Ankle eversion      (Blank rows = not tested)  LOWER EXTREMITY MMT:    MMT Right eval Left eval Right  3/26 Left  3/26 Right 5/15 Left 5/15  Hip flexion 13.1 12.3 15.1 26.1 18.2 26.2  Hip extension        Hip abduction 11.5 10.1 13.7 24.4 15.9 24  Hip adduction        Hip internal rotation        Hip external rotation        Knee flexion        Knee extension 14.0 13.3 17.5 33.3 20 38.2  Ankle dorsiflexion        Ankle plantarflexion        Ankle inversion        Ankle eversion         (Blank rows = not tested)  FUNCTIONAL TESTS:  5 times sit to stand: 28 sec  5/15 5 times sit to stand test 20 seconds  3-minute walk test 150 feet without requirement of seated rest break 5/15  GAIT:  TODAY'S TREATMENT:                                                                                                                              DATE:  6/26 Manual:  Trigger point release to gluteals and low back; side lying roller to hips and IT band   Ex ball roll fwd x10 10 sec hold  Lateral x10 5 sec hold reviewed how to do for home  6/12 Trigger Point Dry-Needling  Treatment instructions:  Expect mild to moderate muscle soreness. S/S of pneumothorax if dry needled over a lung field, and to seek immediate medical attention should they occur. Patient verbalized understanding of these instructions and education.  Patient Consent Given: Yes Education handout provided: Yes Muscles treated: Right gluteal 3 spots using 0.30 X75 needle left gluteal 2 spots using a 0.30 x 75 needle Treatment response/outcome: Good twitch response; immediate pain relief.  Supine march 2 x 20 Supine hip abduction 2 x 20 red Short arc quad 2 x 20 bilateral  Standing weight shift x 20 2 inch step up 2 x 10 forward 2 x 10 lateral    6/5 Trigger Point Dry-Needling  Treatment instructions: Expect mild to moderate muscle soreness. S/S of pneumothorax if dry needled over a lung field, and to seek immediate medical attention should they occur. Patient verbalized understanding of these instructions and education.  Patient Consent Given: Yes Education handout provided: Yes Muscles treated: Right gluteal 3 spots using 0.30 X75 needle left gluteal 2 spots using a 0.30 x 75 needle Treatment response/outcome: Good twitch response; immediate pain relief.   Supine march 2 x 20 Supine hip abduction 2 x 20 red Short arc quad 2 x 20 bilateral  Standing weight shift x 20 2 inch step up 2 x 10 forward 2 x 10 lateral    PATIENT EDUCATION:  Education details: reviewed HEP, symptom management  Person educated: Patient Education method: Explanation, Demonstration, Tactile cues, Verbal cues, and Handouts Education comprehension: verbalized understanding, returned demonstration, verbal cues required, tactile cues required, and needs further education  HOME EXERCISE PROGRAM: Has previous HEP. Will review next visit  ASSESSMENT:  CLINICAL IMPRESSION: The patient will be having nan injection later. We held on needling for this reason. We focuased on manual therapy and stretching. She felt better before she  left.   OBJECTIVE IMPAIRMENTS: Abnormal gait, decreased activity tolerance, decreased balance, decreased endurance, decreased knowledge of use of DME, decreased mobility, difficulty walking, decreased ROM, decreased strength, increased muscle spasms, and pain.   ACTIVITY LIMITATIONS: carrying, lifting, bending, standing, squatting, sleeping, stairs, transfers, and locomotion level  PARTICIPATION LIMITATIONS: meal prep, cleaning, laundry, driving, shopping, community activity, and yard work  PERSONAL FACTORS: Right Hip replacement 2022, Low back Pain, MS, TIA October 2023; MI, November 2022; CAD, Sjorgens syndrome are also affecting patient's functional outcome. MS  REHAB POTENTIAL: Good  CLINICAL DECISION MAKING: Evolving/moderate complexity declining mobility   EVALUATION COMPLEXITY: Moderate   GOALS: Goals reviewed with patient? No  SHORT TERM GOALS: Target date: 09/29/2022    Patient will increase gross bilateral lower extremity strength by 5 pounds Baseline: Goal status: progressing 5/15  2.  Patient will increase lumbar flexion by 10 degrees Baseline:  Goal status:  mild improvement but still painful   3.  Patient will be independent with basic after exercise program and stretching program Baseline:  Goal status: has basic HEP    LONG TERM GOALS: Target date: 09/29/2022     Patient will stand for greater than 30 minutes without increased pain Baseline:  Goal status: improving 5/15   2.  Patient will pick item up off the floor without increased low back pain Baseline:  Goal status:still having pain 5/15  3.  Patient will ambulate community distances without report of increased low back pain Baseline:  Goal status: INITIAL  4.  Patient will report improved ability to sleep through the night secondary to back pain Baseline:  Goal status: INITIAL    PLAN:  PT FREQUENCY: 1-2x/week  PT DURATION: 16 weeks   PLANNED INTERVENTIONS: Therapeutic exercises,  Therapeutic activity, Neuromuscular re-education, Balance training, Gait training, Patient/Family education, Self Care, Joint mobilization, Stair training, DME instructions, Aquatic Therapy, Dry Needling, Spinal mobilization, Cryotherapy, Moist heat, Ionotophoresis 4mg /ml Dexamethasone, and Manual therapy.  PLAN FOR NEXT SESSION: Consider manual therapy to the back and hips, consider trigger point dry needling to back and hips.  Reviewed basic  HEP.  Begin active strengthening of hips and core.  Begin gait training and functional mobility training.   Dessie Coma, PT 10/28/2022, 10:48 AM

## 2023-01-20 ENCOUNTER — Encounter (HOSPITAL_BASED_OUTPATIENT_CLINIC_OR_DEPARTMENT_OTHER): Payer: Self-pay

## 2023-01-27 ENCOUNTER — Other Ambulatory Visit: Payer: Self-pay | Admitting: Family

## 2023-01-27 MED ORDER — AMOXICILLIN 500 MG PO TABS
ORAL_TABLET | ORAL | 2 refills | Status: DC
Start: 2023-01-27 — End: 2023-11-02

## 2023-01-27 NOTE — Telephone Encounter (Signed)
Attempted to call patient to let her know the medication has been sent to the pharmacy

## 2023-01-27 NOTE — Progress Notes (Signed)
Amoxicillin 500 mg tablet Take 4 tabs 1-2 hrs prior to dental procedure refilled as requested.

## 2023-01-28 ENCOUNTER — Ambulatory Visit (HOSPITAL_BASED_OUTPATIENT_CLINIC_OR_DEPARTMENT_OTHER): Payer: Medicare Other | Attending: Physical Medicine and Rehabilitation | Admitting: Physical Therapy

## 2023-01-28 ENCOUNTER — Encounter (HOSPITAL_BASED_OUTPATIENT_CLINIC_OR_DEPARTMENT_OTHER): Payer: Self-pay | Admitting: Physical Therapy

## 2023-01-28 DIAGNOSIS — R262 Difficulty in walking, not elsewhere classified: Secondary | ICD-10-CM | POA: Diagnosis not present

## 2023-01-28 DIAGNOSIS — R252 Cramp and spasm: Secondary | ICD-10-CM | POA: Insufficient documentation

## 2023-01-28 DIAGNOSIS — M5459 Other low back pain: Secondary | ICD-10-CM | POA: Insufficient documentation

## 2023-01-28 DIAGNOSIS — M6281 Muscle weakness (generalized): Secondary | ICD-10-CM | POA: Diagnosis not present

## 2023-01-28 NOTE — Therapy (Signed)
OUTPATIENT PHYSICAL THERAPY THORACOLUMBAR Treatment    Patient Name: Dawn [name] MRN: [id number] DOB:1946/04/13, 77 y.o., female Today's Date: 01/28/2023  END OF SESSION:  PT End of Session - 01/28/23 1328     Visit Number 22    Number of Visits 26    Date for PT Re-Evaluation 02/11/23    Authorization Type progress note done at visit 16 next at 26    PT Start Time 1017    PT Stop Time 1058    PT Time Calculation (min) 41 min    Activity Tolerance Patient tolerated treatment well    Behavior During Therapy [facility] for tasks assessed/performed               Past Medical History:  Diagnosis Date   Allergy    CAD (coronary artery disease)    s/p DES to RCA in 06/2021   Dry eyes    Endometrial polyp    Essential hypertension 08/25/2018   GERD (gastroesophageal reflux disease)    History of hiatal hernia    History of kidney stones    Hot flashes, menopausal 10/13/2011   Estradiol started    Hyperlipidemia    Jaundice as teenager   no problems since   Memory loss 09/21/2019     1/2 of feet numb all the time   Multiple sclerosis (HCC) dx 2001   Neuropathy    bilateral feet   Osteoarthritis    Sjogren's syndrome (HCC) dx oct 2021   sore muscvles, dry mouth and eyes   Stroke (HCC)    Vertigo    Vision abnormalities    Past Surgical History:  Procedure Laterality Date   ABDOMINAL HYSTERECTOMY  2002   partial   COLONOSCOPY  01/27/2022   2 day prep   colonscopy  2011   CORONARY STENT INTERVENTION N/A 07/18/2021   Procedure: CORONARY STENT INTERVENTION;  Surgeon: Corky Crafts, MD;  Location: [facility] INVASIVE CV [facility];  Service: Cardiovascular;  Laterality: N/A;   DILATATION & CURETTAGE/HYSTEROSCOPY WITH MYOSURE N/A 06/08/2020   Procedure: DILATATION & CURETTAGE/HYSTEROSCOPY/Polypectomy WITH MYOSURE;  Surgeon: Toy Baker, DO;  Location: [facility];  Service: Gynecology;  Laterality: N/A;   LEFT HEART CATH AND CORONARY ANGIOGRAPHY N/A  07/18/2021   Procedure: LEFT HEART CATH AND CORONARY ANGIOGRAPHY;  Surgeon: Corky Crafts, MD;  Location: [facility] INVASIVE CV LAB;  Service: Cardiovascular;  Laterality: N/A;   LUMBAR FUSION  2001   TOTAL HIP ARTHROPLASTY Right 01/17/2021   Procedure: TOTAL HIP ARTHROPLASTY ANTERIOR APPROACH;  Surgeon: Samson Frederic, MD;  Location: [facility];  Service: Orthopedics;  Laterality: Right;   UPPER GI ENDOSCOPY  yrs ago   Patient Active Problem List   Diagnosis Date Noted   Positive colorectal cancer screening using Cologuard test 11/14/2021   Coronary artery disease    Statin intolerance 04/19/2021   Osteoarthritis of right hip 01/17/2021   Status post THR (total hip replacement) 01/17/2021   Chronic bilateral low back pain without sciatica 10/02/2020   Chronic pain syndrome 06/20/2020   Post laminectomy syndrome 06/20/2020   Sjogren's disease (HCC) 05/23/2020   Primary osteoarthritis of both hands 05/23/2020   Primary osteoarthritis of both feet 05/23/2020   Other secondary scoliosis, lumbar region 12/22/2019   Spondylosis without myelopathy or radiculopathy, lumbar region 12/22/2019   Cervical radiculopathy 10/18/2019   Numbness 09/21/2019   Bilateral carpal tunnel syndrome 09/21/2019   High risk medication use 09/21/2019   Memory loss 09/21/2019   Essential hypertension  08/25/2018   Abnormal SPEP 02/19/2018   Hand pain 02/20/2017   Multiple joint pain 02/18/2017   Disturbed cognition 06/25/2016   Sciatica, right side 10/30/2015   Chronic fatigue 08/04/2014   Gait disturbance 08/04/2014   Unspecified visual disturbance 01/31/2013   Transient vision disturbance 01/31/2013   Colon polyps 11/03/2011   POSTHERPETIC NEURALGIA 10/03/2009   DISPLCMT LUMBAR INTERVERT DISC W/O MYELOPATHY 09/05/2009   RENAL CALCULUS, RECURRENT 09/04/2009   Hyperlipidemia 08/31/2009   Multiple sclerosis (HCC) 08/31/2009   ALLERGIC RHINITIS 08/31/2009  Progress Note Reporting Period 10/14/2022 to  12/03/2022  See note below for Objective Data and Assessment of Progress/Goals.     PCP: Iantha Fallen NP  REFERRING PROVIDER: Dr Romero Belling   REFERRING DIAG: Low Back Pain   Rationale for Evaluation and Treatment: Rehabilitation  THERAPY DIAG:  Difficulty in walking, not elsewhere classified  Cramp and spasm  Other low back pain  Muscle weakness (generalized)  ONSET DATE: About 11 days ago her nerve abiliation wore off.   SUBJECTIVE:                                                                                                                                                                                           SUBJECTIVE STATEMENT: The patient has had back injections which helped. She has been having more right hip pain  PERTINENT HISTORY:  Right Hip replacement 2022, Low back Pain, MS, TIA October 2023; MI, November 2022; CAD, Sjorgens syndrome  PAIN:  Are you having pain? Yes: NPRS scale: 4/10 Pain location: right hip  /lumbar spine Pain description: aching  Aggravating factors: standing and walking  Relieving factors: rest    PRECAUTIONS: None  WEIGHT BEARING RESTRICTIONS: No  FALLS:  Has patient fallen in last 6 months? 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   LUMBAR ROM:    AROM eval 5/15  Flexion Limited 50 % with pain  Mild improvement but still about 50%  Extension No limit    Right lateral flexion    Left lateral flexion    Right rotation Limited 50% with pain    Left rotation Limited 50% with pain     (Blank rows = not tested)  LOWER EXTREMITY ROM:     Passive  Right eval Left eval Right   Hip flexion 105 90 105  Hip extension   10 degrees from neutral before manual today. Too neutral after stretching   Hip abduction     Hip adduction     Hip internal rotation painful Painful  Painful   Hip external rotation   20 degrees with pain   Knee flexion     Knee extension     Ankle dorsiflexion     Ankle plantarflexion     Ankle inversion     Ankle eversion      (Blank rows = not tested)  LOWER EXTREMITY MMT:    MMT Right eval Left eval Right  3/26 Left  3/26 Right 5/15 Left 5/15  Hip flexion 13.1 12.3 15.1 26.1 18.2 26.2  Hip extension        Hip abduction 11.5 10.1 13.7 24.4 15.9 24  Hip adduction        Hip internal rotation        Hip external rotation        Knee flexion        Knee extension 14.0 13.3 17.5 33.3 20 38.2  Ankle dorsiflexion        Ankle plantarflexion        Ankle inversion        Ankle eversion         (Blank rows = not tested)  FUNCTIONAL TESTS:  5 times sit to stand: 28 sec  5/15 5 times sit to stand test 20 seconds  3-minute walk test 150 feet without requirement of seated rest break 5/15  GAIT:  TODAY'S TREATMENT:                                                                                                                              DATE:  7/10 Manual:  Trigger point release to gluteals and low back; side lying roller to hips and IT band; roller to anterior hip   Supine march 2 x 20 Supine hip abduction 2 x 20 red Short arc quad 2 x 20 bilateral  Standing weight shift x 20 2 inch step up 2 x 10 forward 2 x 10 lateral  LAQ 2x15 red  Hamstring curl 2x15 red     6/26 Manual:   Trigger point release to gluteals and low back; side lying roller to hips and IT band   Ex ball roll fwd x10 10 sec hold  Lateral x10 5 sec hold reviewed how to do for home  6/12 Trigger Point Dry-Needling  Treatment instructions: Expect mild to moderate muscle soreness. S/S of pneumothorax if dry needled over a  lung field, and to seek immediate medical attention should they occur. Patient verbalized understanding of these instructions and education.  Patient Consent Given: Yes Education handout provided: Yes Muscles treated: Right gluteal 3 spots using 0.30 X75 needle left gluteal 2 spots using a 0.30 x 75 needle Treatment response/outcome: Good twitch response; immediate pain relief.   Supine march 2 x 20 Supine hip abduction 2 x 20 red Short arc quad 2 x 20 bilateral  Standing weight shift x 20 2 inch step up 2 x 10 forward 2 x 10 lateral    6/5 Trigger Point Dry-Needling  Treatment instructions: Expect mild to moderate muscle soreness. S/S of pneumothorax if dry needled over a lung field, and to seek immediate medical attention should they occur. Patient verbalized understanding of these instructions and education.  Patient Consent Given: Yes Education handout provided: Yes Muscles treated: Right gluteal 3 spots using 0.30 X75 needle left gluteal 2 spots using a 0.30 x 75 needle Treatment response/outcome: Good twitch response; immediate pain relief.   Supine march 2 x 20 Supine hip abduction 2 x 20 red Short arc quad 2 x 20 bilateral  Standing weight shift x 20 2 inch step up 2 x 10 forward 2 x 10 lateral    PATIENT EDUCATION:  Education details: reviewed HEP, symptom management  Person educated: Patient Education method: Explanation, Demonstration, Tactile cues, Verbal cues, and Handouts Education comprehension: verbalized understanding, returned demonstration, verbal cues required, tactile cues required, and needs further education  HOME EXERCISE  PROGRAM: Has previous HEP. Will review next visit  ASSESSMENT:  CLINICAL IMPRESSION: Patient's chief complaint today with anterior hip pain.  She continues to have significant trigger points in her anterior hip.  Therapy perform manual therapy to the spots.  She felt like was much looser afterwards.  We were able to perform hip flexor strengthening following manual therapy.  She also has a large trigger point in her gluteal.  We performed manual therapy on that area as well.  She reported reduction in pain.  Her shots in her SI joint and SI joint have helped her low back pain.  We were able to perform more exercise today compared to previous visits.  Therapy will continue to progress patient to help her maintain her current level mobility.  She reports continued ability to be able to ambulate around her house.   OBJECTIVE IMPAIRMENTS: Abnormal gait, decreased activity tolerance, decreased balance, decreased endurance, decreased knowledge of use of DME, decreased mobility, difficulty walking, decreased ROM, decreased strength, increased muscle spasms, and pain.   ACTIVITY LIMITATIONS: carrying, lifting, bending, standing, squatting, sleeping, stairs, transfers, and locomotion level  PARTICIPATION LIMITATIONS: meal prep, cleaning, laundry, driving, shopping, community activity, and yard work  PERSONAL FACTORS: Right Hip replacement 2022, Low back Pain, MS, TIA October 2023; MI, November 2022; CAD, Sjorgens syndrome are also affecting patient's functional outcome. MS  REHAB POTENTIAL: Good  CLINICAL DECISION MAKING: Evolving/moderate complexity declining mobility   EVALUATION COMPLEXITY: Moderate   GOALS: Goals reviewed with patient? No  SHORT TERM GOALS: Target date: 09/29/2022    Patient will increase gross bilateral lower extremity strength by 5 pounds Baseline: Goal status: progressing 5/15  2.  Patient will increase lumbar flexion by 10 degrees Baseline:  Goal status:  mild  improvement but still painful   3.  Patient will be independent with basic after exercise program and stretching program Baseline:  Goal status: has basic HEP    LONG TERM GOALS: Target date: 09/29/2022  Patient will stand for greater than 30 minutes without increased pain Baseline:  Goal status: improving 5/15   2.  Patient will pick item up off the floor without increased low back pain Baseline:  Goal status:still having pain 5/15  3.  Patient will ambulate community distances without report of increased low back pain Baseline:  Goal status: INITIAL  4.  Patient will report improved ability to sleep through the night secondary to back pain Baseline:  Goal status: INITIAL    PLAN:  PT FREQUENCY: 1-2x/week  PT DURATION: 16 weeks   PLANNED INTERVENTIONS: Therapeutic exercises, Therapeutic activity, Neuromuscular re-education, Balance training, Gait training, Patient/Family education, Self Care, Joint mobilization, Stair training, DME instructions, Aquatic Therapy, Dry Needling, Spinal mobilization, Cryotherapy, Moist heat, Ionotophoresis 4mg /ml Dexamethasone, and Manual therapy.  PLAN FOR NEXT SESSION: Consider manual therapy to the back and hips, consider trigger point dry needling to back and hips.  Reviewed basic HEP.  Begin active strengthening of hips and core.  Begin gait training and functional mobility training.   Dessie Coma, PT 10/28/2022, 1:30 PM

## 2023-02-03 ENCOUNTER — Telehealth (HOSPITAL_BASED_OUTPATIENT_CLINIC_OR_DEPARTMENT_OTHER): Payer: Self-pay | Admitting: Family

## 2023-02-03 DIAGNOSIS — E785 Hyperlipidemia, unspecified: Secondary | ICD-10-CM

## 2023-02-03 DIAGNOSIS — Z5181 Encounter for therapeutic drug level monitoring: Secondary | ICD-10-CM

## 2023-02-03 DIAGNOSIS — I10 Essential (primary) hypertension: Secondary | ICD-10-CM

## 2023-02-03 NOTE — Telephone Encounter (Signed)
Returned call to patient, no answer, Left message for patient to call back

## 2023-02-03 NOTE — Telephone Encounter (Signed)
Pt c/o medication issue:  1. Name of Medication: Rosuvastatin  2. How are you currently taking this medication (dosage and times per day)? 1 time a day  3. Are you having a reaction (difficulty breathing--STAT)?   4. What is your medication issue? Patient says she have been off of her Rosuvastatin a few weeks per Luther Parody- she wants to know what does she need to do next please?

## 2023-02-04 ENCOUNTER — Encounter: Payer: Self-pay | Admitting: Cardiovascular Disease

## 2023-02-04 MED ORDER — PRAVASTATIN SODIUM 20 MG PO TABS: 20.0000 mg | ORAL_TABLET | Freq: Every evening | ORAL | 3 refills | Status: DC

## 2023-02-04 NOTE — Telephone Encounter (Signed)
Advised patient, verbalized understanding  Mailed lab orders  

## 2023-02-04 NOTE — Telephone Encounter (Signed)
 This encounter was created in error - please disregard.

## 2023-02-04 NOTE — Telephone Encounter (Signed)
February 04, 2023 [name], NP  to [facility]     02/04/23  8:17 AM She recently completed statin holiday. If myalgia not improved, return to Crestor. If myalgia have improved, will instead utilize Pravastatin 20 mg daily with FLP/LFT in 2 months.  TY!

## 2023-02-04 NOTE — Addendum Note (Signed)
Addended by: Regis Bill B on: 02/04/2023 11:27 AM   Modules accepted: Orders

## 2023-02-04 NOTE — Telephone Encounter (Signed)
[name], NP  to Me     02/04/23  8:17 AM She recently completed statin holiday. If myalgia not improved, return to Crestor. If myalgia have improved, will instead utilize Pravastatin 20 mg daily with FLP/LFT in 2 months.  TY!    RB   02/04/23  8:07 AM Bolick, Fleet Contras A routed this conversation to [facility] Triage Le[place] Reader A   RB   02/04/23  8:07 AM Note Pt states she has not been taking medication (Crestor) and wants to know the next steps. Please advise.

## 2023-02-04 NOTE — Telephone Encounter (Signed)
Pt states she has not been taking medication (Crestor) and wants to know the next steps. Please advise.

## 2023-02-05 ENCOUNTER — Encounter (HOSPITAL_BASED_OUTPATIENT_CLINIC_OR_DEPARTMENT_OTHER): Payer: Self-pay | Admitting: Physical Therapy

## 2023-02-05 ENCOUNTER — Ambulatory Visit (HOSPITAL_BASED_OUTPATIENT_CLINIC_OR_DEPARTMENT_OTHER): Payer: Medicare Other | Admitting: Physical Therapy

## 2023-02-05 DIAGNOSIS — M6281 Muscle weakness (generalized): Secondary | ICD-10-CM | POA: Diagnosis not present

## 2023-02-05 DIAGNOSIS — R262 Difficulty in walking, not elsewhere classified: Secondary | ICD-10-CM | POA: Diagnosis not present

## 2023-02-05 DIAGNOSIS — M5459 Other low back pain: Secondary | ICD-10-CM | POA: Diagnosis not present

## 2023-02-05 DIAGNOSIS — R252 Cramp and spasm: Secondary | ICD-10-CM

## 2023-02-05 NOTE — Therapy (Signed)
OUTPATIENT PHYSICAL THERAPY THORACOLUMBAR Treatment    Patient Name: Dawn [name] MRN: [id number] DOB:[birth year], 77 y.o., female Today's Date: 02/05/2023  END OF SESSION:  PT End of Session - 02/05/23 1513     Visit Number 23    Number of Visits 26    Date for PT Re-Evaluation 02/11/23    Authorization Type progress note done at visit 16 next at 26    PT Start Time 1015    PT Stop Time 1058    PT Time Calculation (min) 43 min    Activity Tolerance Patient tolerated treatment well    Behavior During Therapy [facility] for tasks assessed/performed                Past Medical History:  Diagnosis Date   Allergy    CAD (coronary artery disease)    s/p DES to RCA in 06/2021   Dry eyes    Endometrial polyp    Essential hypertension 08/25/2018   GERD (gastroesophageal reflux disease)    History of hiatal hernia    History of kidney stones    Hot flashes, menopausal 10/13/2011   Estradiol started    Hyperlipidemia    Jaundice as teenager   no problems since   Memory loss 09/21/2019     1/2 of feet numb all the time   Multiple sclerosis (HCC) dx 2001   Neuropathy    bilateral feet   Osteoarthritis    Sjogren's syndrome (HCC) dx oct 2021   sore muscvles, dry mouth and eyes   Stroke (HCC)    Vertigo    Vision abnormalities    Past Surgical History:  Procedure Laterality Date   ABDOMINAL HYSTERECTOMY  2002   partial   COLONOSCOPY  01/27/2022   2 day prep   colonscopy  2011   CORONARY STENT INTERVENTION N/A 07/18/2021   Procedure: CORONARY STENT INTERVENTION;  Surgeon: Corky Crafts, MD;  Location: [facility];  Service: Cardiovascular;  Laterality: N/A;   DILATATION & CURETTAGE/HYSTEROSCOPY WITH MYOSURE N/A 06/08/2020   Procedure: DILATATION & CURETTAGE/HYSTEROSCOPY/Polypectomy WITH MYOSURE;  Surgeon: Toy Baker, DO;  Location: [facility];  Service: Gynecology;  Laterality: N/A;   LEFT HEART CATH AND CORONARY ANGIOGRAPHY N/A  07/18/2021   Procedure: LEFT HEART CATH AND CORONARY ANGIOGRAPHY;  Surgeon: Corky Crafts, MD;  Location: [facility] CV [facility];  Service: Cardiovascular;  Laterality: N/A;   LUMBAR FUSION  2001   TOTAL HIP ARTHROPLASTY Right 01/17/2021   Procedure: TOTAL HIP ARTHROPLASTY ANTERIOR APPROACH;  Surgeon: Samson Frederic, MD;  Location: [facility];  Service: Orthopedics;  Laterality: Right;   UPPER GI ENDOSCOPY  yrs ago   Patient Active Problem List   Diagnosis Date Noted   Positive colorectal cancer screening using Cologuard test 11/14/2021   Coronary artery disease    Statin intolerance 04/19/2021   Osteoarthritis of right hip 01/17/2021   Status post THR (total hip replacement) 01/17/2021   Chronic bilateral low back pain without sciatica 10/02/2020   Chronic pain syndrome 06/20/2020   Post laminectomy syndrome 06/20/2020   Sjogren's disease (HCC) 05/23/2020   Primary osteoarthritis of both hands 05/23/2020   Primary osteoarthritis of both feet 05/23/2020   Other secondary scoliosis, lumbar region 12/22/2019   Spondylosis without myelopathy or radiculopathy, lumbar region 12/22/2019   Cervical radiculopathy 10/18/2019   Numbness 09/21/2019   Bilateral carpal tunnel syndrome 09/21/2019   High risk medication use 09/21/2019   Memory loss 09/21/2019   Essential  hypertension 08/25/2018   Abnormal SPEP 02/19/2018   Hand pain 02/20/2017   Multiple joint pain 02/18/2017   Disturbed cognition 06/25/2016   Sciatica, right side 10/30/2015   Chronic fatigue 08/04/2014   Gait disturbance 08/04/2014   Unspecified visual disturbance 01/31/2013   Transient vision disturbance 01/31/2013   Colon polyps 11/03/2011   POSTHERPETIC NEURALGIA 10/03/2009   DISPLCMT LUMBAR INTERVERT DISC W/O MYELOPATHY 09/05/2009   RENAL CALCULUS, RECURRENT 09/04/2009   Hyperlipidemia 08/31/2009   Multiple sclerosis (HCC) 08/31/2009   ALLERGIC RHINITIS 08/31/2009  Progress Note Reporting Period 10/14/2022 to  12/03/2022  See note below for Objective Data and Assessment of Progress/Goals.     PCP: Iantha Fallen NP  REFERRING PROVIDER: Dr Romero Belling   REFERRING DIAG: Low Back Pain   Rationale for Evaluation and Treatment: Rehabilitation  THERAPY DIAG:  Difficulty in walking, not elsewhere classified  Cramp and spasm  Other low back pain  Muscle weakness (generalized)  ONSET DATE: About 11 days ago her nerve abiliation wore off.   SUBJECTIVE:                                                                                                                                                                                           SUBJECTIVE STATEMENT: The patients back has started hurting more. She continues to go to the pool and work on home exercises.   PERTINENT HISTORY:  Right Hip replacement 2022, Low back Pain, MS, TIA October 2023; MI, November 2022; CAD, Sjorgens syndrome  PAIN:  Are you having pain? Yes: NPRS scale: 4/10 Pain location: right hip  /lumbar spine Pain description: aching  Aggravating factors: standing and walking  Relieving factors: rest    PRECAUTIONS: None  WEIGHT BEARING RESTRICTIONS: No  FALLS:  Has patient fallen in last 6 months? 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    LUMBAR ROM:   AROM eval 5/15  Flexion Limited 50 % with pain  Mild  improvement but still about 50%   Extension No limit    Right lateral flexion    Left lateral flexion    Right rotation Limited 50% with pain    Left rotation Limited 50% with pain     (Blank rows = not tested)  LOWER EXTREMITY ROM:     Passive  Right eval Left eval Right   Hip flexion 105 90 105  Hip extension   10 degrees from neutral before manual today. Too neutral after stretching   Hip abduction     Hip adduction     Hip internal rotation painful Painful  Painful   Hip external rotation   20 degrees with pain   Knee flexion     Knee extension     Ankle dorsiflexion     Ankle plantarflexion     Ankle inversion     Ankle eversion      (Blank rows = not tested)  LOWER EXTREMITY MMT:    MMT Right eval Left eval Right  3/26 Left  3/26 Right 5/15 Left 5/15  Hip flexion 13.1 12.3 15.1 26.1 18.2 26.2  Hip extension        Hip abduction 11.5 10.1 13.7 24.4 15.9 24  Hip adduction        Hip internal rotation        Hip external rotation        Knee flexion        Knee extension 14.0 13.3 17.5 33.3 20 38.2  Ankle dorsiflexion        Ankle plantarflexion        Ankle inversion        Ankle eversion         (Blank rows = not tested)  FUNCTIONAL TESTS:  5 times sit to stand: 28 sec  5/15 5 times sit to stand test 20 seconds  3-minute walk test 150 feet without requirement of seated rest break 5/15  GAIT:  TODAY'S TREATMENT:                                                                                                                              DATE:  7/18 Manual:  Trigger point release to gluteals and low back; side lying roller to hips and IT band; roller to anterior hip   Seated clamshell 3x15 red  LAq 2x15 red each leg   Trigger Point Dry-Needling  Treatment instructions: Expect mild to moderate muscle soreness. S/S of pneumothorax if dry needled over a lung field, and to seek  immediate medical attention should they occur. Patient verbalized understanding of these instructions and education.  Patient Consent Given: Yes Education handout provided: Yes Muscles treated: Right gluteal 3 spots using 0.30 X75 needle left gluteal 2 spots using a 0.30 x 75 needle Treatment response/outcome: Good twitch response; immediate pain relief.  7/10 Manual:  Trigger point release to gluteals and low back; side lying roller to hips and  IT band; roller to anterior hip   Supine march 2 x 20 Supine hip abduction 2 x 20 red Short arc quad 2 x 20 bilateral  Standing weight shift x 20 2 inch step up 2 x 10 forward 2 x 10 lateral  LAQ 2x15 red  Hamstring curl 2x15 red     6/26 Manual:  Trigger point release to gluteals and low back; side lying roller to hips and IT band   Ex ball roll fwd x10 10 sec hold  Lateral x10 5 sec hold reviewed how to do for home  6/12 Trigger Point Dry-Needling  Treatment instructions: Expect mild to moderate muscle soreness. S/S of pneumothorax if dry needled over a lung field, and to seek immediate medical attention should they occur. Patient verbalized understanding of these instructions and education.  Patient Consent Given: Yes Education handout provided: Yes Muscles treated: Right gluteal 3 spots using 0.30 X75 needle left gluteal 2 spots using a 0.30 x 75 needle Treatment response/outcome: Good twitch response; immediate pain relief.   Supine march 2 x 20 Supine hip abduction 2 x 20 red Short arc quad 2 x 20 bilateral  Standing weight shift x 20 2 inch step up 2 x 10 forward 2 x 10 lateral    6/5 Trigger Point Dry-Needling  Treatment instructions: Expect mild to moderate muscle soreness. S/S of pneumothorax if dry needled over a lung field, and to seek immediate medical attention should they occur. Patient verbalized understanding of these instructions and education.  Patient Consent Given: Yes Education handout provided:  Yes Muscles treated: Right gluteal 3 spots using 0.30 X75 needle left gluteal 2 spots using a 0.30 x 75 needle Treatment response/outcome: Good twitch response; immediate pain relief.   Supine march 2 x 20 Supine hip abduction 2 x 20 red Short arc quad 2 x 20 bilateral  Standing weight shift x 20 2 inch step up 2 x 10 forward 2 x 10 lateral    PATIENT EDUCATION:  Education details: reviewed HEP, symptom management  Person educated: Patient Education method: Explanation, Demonstration, Tactile cues, Verbal cues, and Handouts Education comprehension: verbalized understanding, returned demonstration, verbal cues required, tactile cues required, and needs further education  HOME EXERCISE PROGRAM: Has previous HEP. Will review next visit  ASSESSMENT:  CLINICAL IMPRESSION: The patient had a good twitch response to needling. She reported improved pain and ability to stand straight following therapy. We focused on manual and needling but she was able to do light exercises. We will continue to progress as tolerated.    OBJECTIVE IMPAIRMENTS: Abnormal gait, decreased activity tolerance, decreased balance, decreased endurance, decreased knowledge of use of DME, decreased mobility, difficulty walking, decreased ROM, decreased strength, increased muscle spasms, and pain.   ACTIVITY LIMITATIONS: carrying, lifting, bending, standing, squatting, sleeping, stairs, transfers, and locomotion level  PARTICIPATION LIMITATIONS: meal prep, cleaning, laundry, driving, shopping, community activity, and yard work  PERSONAL FACTORS: Right Hip replacement 2022, Low back Pain, MS, TIA October 2023; MI, November 2022; CAD, Sjorgens syndrome are also affecting patient's functional outcome. MS  REHAB POTENTIAL: Good  CLINICAL DECISION MAKING: Evolving/moderate complexity declining mobility   EVALUATION COMPLEXITY: Moderate   GOALS: Goals reviewed with patient? No  SHORT TERM GOALS: Target date:  09/29/2022    Patient will increase gross bilateral lower extremity strength by 5 pounds Baseline: Goal status: progressing 5/15  2.  Patient will increase lumbar flexion by 10 degrees Baseline:  Goal status:  mild improvement but still painful   3.  Patient will be independent with basic after exercise program and stretching program Baseline:  Goal status: has basic HEP    LONG TERM GOALS: Target date: 09/29/2022     Patient will stand for greater than 30 minutes without increased pain Baseline:  Goal status: improving 5/15   2.  Patient will pick item up off the floor without increased low back pain Baseline:  Goal status:still having pain 5/15  3.  Patient will ambulate community distances without report of increased low back pain Baseline:  Goal status: INITIAL  4.  Patient will report improved ability to sleep through the night secondary to back pain Baseline:  Goal status: INITIAL    PLAN:  PT FREQUENCY: 1-2x/week  PT DURATION: 16 weeks   PLANNED INTERVENTIONS: Therapeutic exercises, Therapeutic activity, Neuromuscular re-education, Balance training, Gait training, Patient/Family education, Self Care, Joint mobilization, Stair training, DME instructions, Aquatic Therapy, Dry Needling, Spinal mobilization, Cryotherapy, Moist heat, Ionotophoresis 4mg /ml Dexamethasone, and Manual therapy.  PLAN FOR NEXT SESSION: Consider manual therapy to the back and hips, consider trigger point dry needling to back and hips.  Reviewed basic HEP.  Begin active strengthening of hips and core.  Begin gait training and functional mobility training.   Dessie Coma, PT 10/28/2022, 3:30 PM

## 2023-02-09 DIAGNOSIS — M5416 Radiculopathy, lumbar region: Secondary | ICD-10-CM | POA: Diagnosis not present

## 2023-02-09 DIAGNOSIS — M533 Sacrococcygeal disorders, not elsewhere classified: Secondary | ICD-10-CM | POA: Diagnosis not present

## 2023-02-11 ENCOUNTER — Encounter (HOSPITAL_BASED_OUTPATIENT_CLINIC_OR_DEPARTMENT_OTHER): Payer: Self-pay | Admitting: Physical Therapy

## 2023-02-11 ENCOUNTER — Ambulatory Visit (HOSPITAL_BASED_OUTPATIENT_CLINIC_OR_DEPARTMENT_OTHER): Payer: Medicare Other | Admitting: Physical Therapy

## 2023-02-11 DIAGNOSIS — R262 Difficulty in walking, not elsewhere classified: Secondary | ICD-10-CM

## 2023-02-11 DIAGNOSIS — M5459 Other low back pain: Secondary | ICD-10-CM

## 2023-02-11 DIAGNOSIS — M6281 Muscle weakness (generalized): Secondary | ICD-10-CM | POA: Diagnosis not present

## 2023-02-11 DIAGNOSIS — R252 Cramp and spasm: Secondary | ICD-10-CM

## 2023-02-11 NOTE — Therapy (Unsigned)
OUTPATIENT PHYSICAL THERAPY THORACOLUMBAR Treatment    Patient Name: DEKISHA MESMER MRN: [id number] DOB:[birth year], 77 y.o., female Today's Date: 02/12/2023  END OF SESSION:  PT End of Session - 02/11/23 1020     Visit Number 24    Number of Visits 34    Date for PT Re-Evaluation 04/23/23    Authorization Type progress note done at visit 24  next at 34    PT Start Time 1015    PT Stop Time 1059    PT Time Calculation (min) 44 min    Activity Tolerance Patient tolerated treatment well    Behavior During Therapy [facility] for tasks assessed/performed                Past Medical History:  Diagnosis Date   Allergy    CAD (coronary artery disease)    s/p DES to RCA in 06/2021   Dry eyes    Endometrial polyp    Essential hypertension 08/25/2018   GERD (gastroesophageal reflux disease)    History of hiatal hernia    History of kidney stones    Hot flashes, menopausal 10/13/2011   Estradiol started    Hyperlipidemia    Jaundice as teenager   no problems since   Memory loss 09/21/2019     1/2 of feet numb all the time   Multiple sclerosis (HCC) dx 2001   Neuropathy    bilateral feet   Osteoarthritis    Sjogren's syndrome (HCC) dx oct 2021   sore muscvles, dry mouth and eyes   Stroke (HCC)    Vertigo    Vision abnormalities    Past Surgical History:  Procedure Laterality Date   ABDOMINAL HYSTERECTOMY  2002   partial   COLONOSCOPY  01/27/2022   2 day prep   colonscopy  2011   CORONARY STENT INTERVENTION N/A 07/18/2021   Procedure: CORONARY STENT INTERVENTION;  Surgeon: Corky Crafts, MD;  Location: [facility];  Service: Cardiovascular;  Laterality: N/A;   DILATATION & CURETTAGE/HYSTEROSCOPY WITH MYOSURE N/A 06/08/2020   Procedure: DILATATION & CURETTAGE/HYSTEROSCOPY/Polypectomy WITH MYOSURE;  Surgeon: Toy Baker, DO;  Location: [facility];  Service: Gynecology;  Laterality: N/A;   LEFT HEART CATH AND CORONARY ANGIOGRAPHY N/A  07/18/2021   Procedure: LEFT HEART CATH AND CORONARY ANGIOGRAPHY;  Surgeon: Corky Crafts, MD;  Location: [facility];  Service: Cardiovascular;  Laterality: N/A;   LUMBAR FUSION  2001   TOTAL HIP ARTHROPLASTY Right 01/17/2021   Procedure: TOTAL HIP ARTHROPLASTY ANTERIOR APPROACH;  Surgeon: Samson Frederic, MD;  Location: [facility];  Service: Orthopedics;  Laterality: Right;   UPPER GI ENDOSCOPY  yrs ago   Patient Active Problem List   Diagnosis Date Noted   Positive colorectal cancer screening using Cologuard test 11/14/2021   Coronary artery disease    Statin intolerance 04/19/2021   Osteoarthritis of right hip 01/17/2021   Status post THR (total hip replacement) 01/17/2021   Chronic bilateral low back pain without sciatica 10/02/2020   Chronic pain syndrome 06/20/2020   Post laminectomy syndrome 06/20/2020   Sjogren's disease (HCC) 05/23/2020   Primary osteoarthritis of both hands 05/23/2020   Primary osteoarthritis of both feet 05/23/2020   Other secondary scoliosis, lumbar region 12/22/2019   Spondylosis without myelopathy or radiculopathy, lumbar region 12/22/2019   Cervical radiculopathy 10/18/2019   Numbness 09/21/2019   Bilateral carpal tunnel syndrome 09/21/2019   High risk medication use 09/21/2019   Memory loss 09/21/2019  Essential hypertension 08/25/2018   Abnormal SPEP 02/19/2018   Hand pain 02/20/2017   Multiple joint pain 02/18/2017   Disturbed cognition 06/25/2016   Sciatica, right side 10/30/2015   Chronic fatigue 08/04/2014   Gait disturbance 08/04/2014   Unspecified visual disturbance 01/31/2013   Transient vision disturbance 01/31/2013   Colon polyps 11/03/2011   POSTHERPETIC NEURALGIA 10/03/2009   DISPLCMT LUMBAR INTERVERT DISC W/O MYELOPATHY 09/05/2009   RENAL CALCULUS, RECURRENT 09/04/2009   Hyperlipidemia 08/31/2009   Multiple sclerosis (HCC) 08/31/2009   ALLERGIC RHINITIS 08/31/2009  Progress Note Reporting Period 5/15/12024to  57/25/2024  See note below for Objective Data and Assessment of Progress/Goals.     PCP: Iantha Fallen NP  REFERRING PROVIDER: Dr Romero Belling   REFERRING DIAG: Low Back Pain   Rationale for Evaluation and Treatment: Rehabilitation  THERAPY DIAG:  Difficulty in walking, not elsewhere classified  Cramp and spasm  Other low back pain  Muscle weakness (generalized)  ONSET DATE: About 11 days ago her nerve abiliation wore off.   SUBJECTIVE:                                                                                                                                                                                           SUBJECTIVE STATEMENT: The patient reports continued pain in her back.  She reports has been somewhat consistent over the past few days.  May be less severe than last visit.  Overall she feels like she can continue perform ADLs.  She feels like after physical therapy she only says a few days for mobility.  Will perform reassessment today.   PERTINENT HISTORY:  Right Hip replacement 2022, Low back Pain, MS, TIA October 2023; MI, November 2022; CAD, Sjorgens syndrome  PAIN:  Are you having pain? Yes: NPRS scale: 4/10 Pain location: right hip  /lumbar spine Pain description: aching  Aggravating factors: standing and walking  Relieving factors: rest    PRECAUTIONS: None  WEIGHT BEARING RESTRICTIONS: No  FALLS:  Has patient fallen in last 6 months? 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   LUMBAR ROM:   AROM eval 5/15   Flexion Limited 50 % with pain  Mild improvement but still about 50%  Improved pain but still limited  Extension No limit     Right lateral flexion     Left lateral flexion     Right rotation Limited 50% with pain     Left rotation Limited 50% with pain      (Blank rows = not tested)  LOWER EXTREMITY ROM:     Passive  Right eval Left eval Right  Right  7/24  Hip flexion 105 90 105 110 with pain at end range   Hip extension   10 degrees from neutral before manual today. Too neutral after stretching    Hip abduction      Hip adduction      Hip internal rotation painful Painful  Painful  Full with less pain   Hip external rotation   20 degrees with pain  45 with pain   Knee flexion      Knee extension      Ankle dorsiflexion      Ankle plantarflexion      Ankle inversion      Ankle eversion       (Blank rows = not tested)  LOWER EXTREMITY MMT:    MMT Right eval Left eval Right  3/26 Left  3/26 Right 5/15 Left 5/15 Right 7/24 Left 7/24  Hip flexion 13.1 12.3 15.1 26.1 18.2 26.2 11.3 19.1  Hip extension          Hip abduction 11.5 10.1 13.7 24.4 15.9 24 12.6 17.8  Hip adduction          Hip internal rotation          Hip external rotation          Knee flexion          Knee extension 14.0 13.3 17.5 33.3 20 38.2 19.4 18.3  Ankle dorsiflexion          Ankle plantarflexion          Ankle inversion          Ankle eversion           (Blank rows = not tested)  FUNCTIONAL TESTS:  5 times sit to stand: 28 sec  5/15 5 times sit to stand test 20 seconds 7/25 24 sec   3-minute walk test 150 feet without requirement of seated rest break 5/15  7/25 3 min walk test 200'   GAIT:  TODAY'S TREATMENT:                                                                                                                               DATE:  7/25 Manual:  Trigger point release to gluteals and low back; side lying roller  to hips and IT band; roller to anterior hip   Seated clamshell 3x15 red  LAq 2x15 red each leg   Decompression:  90/90 on ball press x20  DKTC 20   LTR x20   Tests and measures.  Trigger Point Dry-Needling  Treatment instructions: Expect mild to moderate muscle soreness. S/S of pneumothorax if dry needled over a lung field, and to seek immediate medical attention should they occur. Patient verbalized understanding of these instructions and education.  Patient Consent Given: Yes Education handout provided: Yes Muscles treated: Right gluteal 3 spots using 0.30 X75 needle left gluteal 2 spots using a 0.30 x 75 needle Treatment response/outcome: Good twitch response; immediate pain relief.   7/18 Manual:  Trigger point release to gluteals and low back; side lying roller to hips and IT band; roller to anterior hip   Seated clamshell 3x15 red  LAq 2x15 red each leg   Decompression:  90/90 on ball press x20  DKTC 20   LTR x20   Tests and measures.  Trigger Point Dry-Needling  Treatment instructions: Expect mild to moderate muscle soreness. S/S of pneumothorax if dry needled over a lung field, and to seek immediate medical attention should they occur. Patient verbalized understanding of these instructions and education.  Patient Consent Given: Yes Education handout provided: Yes Muscles treated: Right gluteal 3 spots using 0.30 X75 needle left gluteal 2 spots using a 0.30 x 75 needle Treatment response/outcome: Good twitch response; immediate pain relief.  7/10 Manual:  Trigger point release to gluteals and low back; side lying roller to hips and IT band; roller to anterior hip   Supine march 2 x 20 Supine hip abduction 2 x 20 red Short arc quad 2 x 20 bilateral  Standing weight shift x  20 2 inch step up 2 x 10 forward 2 x 10 lateral  LAQ 2x15 red  Hamstring curl 2x15 red     6/26 Manual:  Trigger point release to gluteals and low back; side lying roller to hips and IT band   Ex ball roll fwd x10 10 sec hold  Lateral x10 5 sec hold reviewed how to do for home  6/12 Trigger Point Dry-Needling  Treatment instructions: Expect mild to moderate muscle soreness. S/S of pneumothorax if dry needled over a lung field, and to seek immediate medical attention should they occur. Patient verbalized understanding of these instructions and education.  Patient Consent Given: Yes Education handout provided: Yes Muscles treated: Right gluteal 3 spots using 0.30 X75 needle left gluteal 2 spots using a 0.30 x 75 needle Treatment response/outcome: Good twitch response; immediate pain relief.   Supine march 2 x 20 Supine hip abduction 2 x 20 red Short arc quad 2 x 20 bilateral  Standing weight shift x 20 2 inch step up 2 x 10 forward 2 x 10 lateral    6/5 Trigger Point Dry-Needling  Treatment instructions: Expect mild to moderate muscle soreness. S/S of pneumothorax if dry needled over a lung field, and to seek immediate medical attention should they occur. Patient verbalized understanding of these instructions and education.  Patient Consent Given: Yes Education handout provided: Yes Muscles treated: Right gluteal 3 spots using 0.30 X75 needle left gluteal 2 spots using a 0.30 x 75 needle Treatment response/outcome: Good twitch response; immediate pain relief.   Supine march 2 x 20 Supine hip abduction 2 x 20 red Short arc quad 2 x 20 bilateral  Standing weight shift x 20 2 inch step up  2 x 10 forward 2 x 10 lateral    PATIENT EDUCATION:  Education details: reviewed HEP, symptom management  Person educated: Patient Education method: Explanation, Demonstration, Tactile cues, Verbal cues, and Handouts Education comprehension: verbalized understanding, returned  demonstration, verbal cues required, tactile cues required, and needs further education  HOME EXERCISE PROGRAM: Has previous HEP. Will review next visit  ASSESSMENT:  CLINICAL IMPRESSION: Patient is currently going through an exacerbation low back pain.  She is scheduled for SI injection next week.  She has had success with injections at this time and her last reevaluation.  She has been able to maintain functional glute.  She feels like physical therapy helps her for at least a few days at ADLs with less pain.  She had a reduction in pain going to soft tissue work today.  Her 3-minute walk test distance has improved slightly.  5 times sit to stand decreased slightly.  She is having significant low back and right hip potentially limiting some of her strength numbers.  To progress exercises as tolerated.  She would benefit from continued skilled therapy 1W10 to progress functional mobility and her ability to perform ADL's despite progressive disease process.  See below for goal specific progress.   OBJECTIVE IMPAIRMENTS: Abnormal gait, decreased activity tolerance, decreased balance, decreased endurance, decreased knowledge of use of DME, decreased mobility, difficulty walking, decreased ROM, decreased strength, increased muscle spasms, and pain.   ACTIVITY LIMITATIONS: carrying, lifting, bending, standing, squatting, sleeping, stairs, transfers, and locomotion level  PARTICIPATION LIMITATIONS: meal prep, cleaning, laundry, driving, shopping, community activity, and yard work  PERSONAL FACTORS: Right Hip replacement 2022, Low back Pain, MS, TIA October 2023; MI, November 2022; CAD, Sjorgens syndrome are also affecting patient's functional outcome. MS  REHAB POTENTIAL: Good  CLINICAL DECISION MAKING: Evolving/moderate complexity declining mobility   EVALUATION COMPLEXITY: Moderate   GOALS: Goals reviewed with patient? No  SHORT TERM GOALS: Target date: 09/29/2022    Patient will increase  gross bilateral lower extremity strength by 5 pounds Baseline: Goal status: declined since last eval. Will continue to monitor   2.  Patient will increase lumbar flexion by 10 degrees Baseline:  Goal status:  mild improvement but still painful   3.  Patient will be independent with basic after exercise program and stretching program Baseline:  Goal status: has basic HEP    LONG TERM GOALS: Target date: 09/29/2022     Patient will stand for greater than 30 minutes without increased pain Baseline:  Goal status: improving 5/15   2.  Patient will pick item up off the floor without increased low back pain Baseline:  Goal status:still having pain 5/15  3.  Patient will ambulate community distances without report of increased low back pain Baseline:  Goal status:limited by pain but maintaining 7/25  4.  Patient will report improved ability to sleep through the night secondary to back pain Baseline:  Goal status: continues to have pain at night 7/25    PLAN:  PT FREQUENCY: 1x/week  PT DURATION: 10 weeks   PLANNED INTERVENTIONS: Therapeutic exercises, Therapeutic activity, Neuromuscular re-education, Balance training, Gait training, Patient/Family education, Self Care, Joint mobilization, Stair training, DME instructions, Aquatic Therapy, Dry Needling, Spinal mobilization, Cryotherapy, Moist heat, Ionotophoresis 4mg /ml Dexamethasone, and Manual therapy.  PLAN FOR NEXT SESSION: Consider manual therapy to the back and hips, consider trigger point dry needling to back and hips.  Reviewed basic HEP.  Begin active strengthening of hips and core.  Begin gait training and functional  mobility training.   Dessie Coma, PT 10/28/2022, 8:22 AM

## 2023-02-18 ENCOUNTER — Encounter (HOSPITAL_BASED_OUTPATIENT_CLINIC_OR_DEPARTMENT_OTHER): Payer: Self-pay | Admitting: Physical Therapy

## 2023-02-18 ENCOUNTER — Ambulatory Visit (HOSPITAL_BASED_OUTPATIENT_CLINIC_OR_DEPARTMENT_OTHER): Payer: Medicare Other | Admitting: Physical Therapy

## 2023-02-18 DIAGNOSIS — M5459 Other low back pain: Secondary | ICD-10-CM | POA: Diagnosis not present

## 2023-02-18 DIAGNOSIS — R262 Difficulty in walking, not elsewhere classified: Secondary | ICD-10-CM | POA: Diagnosis not present

## 2023-02-18 DIAGNOSIS — R252 Cramp and spasm: Secondary | ICD-10-CM

## 2023-02-18 DIAGNOSIS — M6281 Muscle weakness (generalized): Secondary | ICD-10-CM | POA: Diagnosis not present

## 2023-02-18 DIAGNOSIS — M5416 Radiculopathy, lumbar region: Secondary | ICD-10-CM | POA: Diagnosis not present

## 2023-02-18 NOTE — Therapy (Signed)
OUTPATIENT PHYSICAL THERAPY THORACOLUMBAR Treatment    Patient Name: Dawn [name] MRN: [id number] DOB:March 13, 1946, 77 y.o., female Today's Date: 02/18/2023  END OF SESSION:  PT End of Session - 02/18/23 1039     Visit Number 25    Number of Visits 34    Date for PT Re-Evaluation 04/23/23    Authorization Type progress note done at visit 24  next at 34    PT Start Time 1020    PT Stop Time 1100    PT Time Calculation (min) 40 min    Activity Tolerance Patient tolerated treatment well    Behavior During Therapy [facility] for tasks assessed/performed                Past Medical History:  Diagnosis Date   Allergy    CAD (coronary artery disease)    s/p DES to RCA in 06/2021   Dry eyes    Endometrial polyp    Essential hypertension 08/25/2018   GERD (gastroesophageal reflux disease)    History of hiatal hernia    History of kidney stones    Hot flashes, menopausal 10/13/2011   Estradiol started    Hyperlipidemia    Jaundice as teenager   no problems since   Memory loss 09/21/2019     1/2 of feet numb all the time   Multiple sclerosis (HCC) dx 2001   Neuropathy    bilateral feet   Osteoarthritis    Sjogren's syndrome (HCC) dx oct 2021   sore muscvles, dry mouth and eyes   Stroke (HCC)    Vertigo    Vision abnormalities    Past Surgical History:  Procedure Laterality Date   ABDOMINAL HYSTERECTOMY  2002   partial   COLONOSCOPY  01/27/2022   2 day prep   colonscopy  2011   CORONARY STENT INTERVENTION N/A 07/18/2021   Procedure: CORONARY STENT INTERVENTION;  Surgeon: Corky Crafts, MD;  Location: [facility];  Service: Cardiovascular;  Laterality: N/A;   DILATATION & CURETTAGE/HYSTEROSCOPY WITH MYOSURE N/A 06/08/2020   Procedure: DILATATION & CURETTAGE/HYSTEROSCOPY/Polypectomy WITH MYOSURE;  Surgeon: Toy Baker, DO;  Location: [facility];  Service: Gynecology;  Laterality: N/A;   LEFT HEART CATH AND CORONARY ANGIOGRAPHY N/A  07/18/2021   Procedure: LEFT HEART CATH AND CORONARY ANGIOGRAPHY;  Surgeon: Corky Crafts, MD;  Location: [facility];  Service: Cardiovascular;  Laterality: N/A;   LUMBAR FUSION  2001   TOTAL HIP ARTHROPLASTY Right 01/17/2021   Procedure: TOTAL HIP ARTHROPLASTY ANTERIOR APPROACH;  Surgeon: Samson Frederic, MD;  Location: [facility];  Service: Orthopedics;  Laterality: Right;   UPPER GI ENDOSCOPY  yrs ago   Patient Active Problem List   Diagnosis Date Noted   Positive colorectal cancer screening using Cologuard test 11/14/2021   Coronary artery disease    Statin intolerance 04/19/2021   Osteoarthritis of right hip 01/17/2021   Status post THR (total hip replacement) 01/17/2021   Chronic bilateral low back pain without sciatica 10/02/2020   Chronic pain syndrome 06/20/2020   Post laminectomy syndrome 06/20/2020   Sjogren's disease (HCC) 05/23/2020   Primary osteoarthritis of both hands 05/23/2020   Primary osteoarthritis of both feet 05/23/2020   Other secondary scoliosis, lumbar region 12/22/2019   Spondylosis without myelopathy or radiculopathy, lumbar region 12/22/2019   Cervical radiculopathy 10/18/2019   Numbness 09/21/2019   Bilateral carpal tunnel syndrome 09/21/2019   High risk medication use 09/21/2019   Memory loss 09/21/2019  Essential hypertension 08/25/2018   Abnormal SPEP 02/19/2018   Hand pain 02/20/2017   Multiple joint pain 02/18/2017   Disturbed cognition 06/25/2016   Sciatica, right side 10/30/2015   Chronic fatigue 08/04/2014   Gait disturbance 08/04/2014   Unspecified visual disturbance 01/31/2013   Transient vision disturbance 01/31/2013   Colon polyps 11/03/2011   POSTHERPETIC NEURALGIA 10/03/2009   DISPLCMT LUMBAR INTERVERT DISC W/O MYELOPATHY 09/05/2009   RENAL CALCULUS, RECURRENT 09/04/2009   Hyperlipidemia 08/31/2009   Multiple sclerosis (HCC) 08/31/2009   ALLERGIC RHINITIS 08/31/2009  Progress Note Reporting Period 5/15/12024to  57/25/2024  See note below for Objective Data and Assessment of Progress/Goals.     PCP: Iantha Fallen NP  REFERRING PROVIDER: Dr Romero Belling   REFERRING DIAG: Low Back Pain   Rationale for Evaluation and Treatment: Rehabilitation  THERAPY DIAG:  Difficulty in walking, not elsewhere classified  Cramp and spasm  Other low back pain  Muscle weakness (generalized)  ONSET DATE: About 11 days ago her nerve abiliation wore off.   SUBJECTIVE:                                                                                                                                                                                           SUBJECTIVE STATEMENT: The patient will see the MD today for injections in her SI. She reports the mid back pain continues to be pretty bad. She had a few days after the last session that weren't as bad.   PERTINENT HISTORY:  Right Hip replacement 2022, Low back Pain, MS, TIA October 2023; MI, November 2022; CAD, Sjorgens syndrome  PAIN:  Are you having pain? Yes: NPRS scale: 4/10 Pain location: right hip  /lumbar spine Pain description: aching  Aggravating factors: standing and walking  Relieving factors: rest    PRECAUTIONS: None  WEIGHT BEARING RESTRICTIONS: No  FALLS:  Has patient fallen in last 6 months? 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  LUMBAR ROM:   AROM eval 5/15   Flexion Limited 50 % with pain  Mild improvement but still about 50%  Improved pain but still limited  Extension No limit     Right lateral flexion     Left lateral flexion     Right rotation Limited 50% with pain     Left rotation Limited 50% with pain      (Blank rows = not tested)  LOWER EXTREMITY ROM:     Passive  Right eval Left eval Right  Right  7/24  Hip flexion 105 90 105 110 with pain at end range   Hip extension   10 degrees from neutral before manual today. Too neutral after stretching    Hip abduction      Hip adduction      Hip internal rotation painful Painful  Painful  Full with less pain   Hip external rotation   20 degrees with pain  45 with pain   Knee flexion      Knee extension      Ankle dorsiflexion      Ankle plantarflexion      Ankle inversion      Ankle eversion       (Blank rows = not tested)  LOWER EXTREMITY MMT:    MMT Right eval Left eval Right  3/26 Left  3/26 Right 5/15 Left 5/15 Right 7/24 Left 7/24  Hip flexion 13.1 12.3 15.1 26.1 18.2 26.2 11.3 19.1  Hip extension          Hip abduction 11.5 10.1 13.7 24.4 15.9 24 12.6 17.8  Hip adduction          Hip internal rotation          Hip external rotation          Knee flexion          Knee extension 14.0 13.3 17.5 33.3 20 38.2 19.4 18.3  Ankle dorsiflexion          Ankle plantarflexion          Ankle inversion          Ankle eversion           (Blank rows = not tested)  FUNCTIONAL TESTS:  5 times sit to stand: 28 sec  5/15 5 times sit to stand test 20 seconds 7/25 24 sec   3-minute walk test 150 feet without requirement of seated rest break 5/15  7/25 3 min walk test 200'   GAIT:  TODAY'S TREATMENT:                                                                                                                              DATE:  7/31 Manual:   Trigger point release to gluteals and low back; side lying roller to hips and IT band; roller to anterior hip   Supine march 3x10  SAQ 2lbs 3x10  LTR x20  Bridge 3x10     Seated clamshell 3x15 red  LAq 2x15 red each leg   7/25 Manual:  Trigger point release to gluteals and low back; side lying roller to hips and IT band; roller to anterior hip   Seated clamshell 3x15 red  LAq 2x15 red each leg   Decompression:  90/90 on ball press x20  DKTC 20   LTR x20   Tests and measures.  Trigger Point Dry-Needling  Treatment instructions: Expect mild to moderate muscle soreness. S/S of pneumothorax if dry needled over a lung field, and to seek immediate medical attention should they occur. Patient verbalized understanding of these instructions and education.  Patient Consent Given: Yes Education handout provided: Yes Muscles treated: Right gluteal 3 spots using 0.30 X75 needle left gluteal 2 spots using a 0.30 x 75 needle Treatment response/outcome: Good twitch response; immediate pain relief.   7/18 Manual:  Trigger point release to gluteals and low back; side lying roller to hips and IT band; roller to anterior hip   Seated clamshell 3x15 red  LAq 2x15 red each leg   Decompression:  90/90 on ball press x20  DKTC 20   LTR x20   Tests and measures.  Trigger Point Dry-Needling  Treatment instructions: Expect mild to moderate muscle soreness. S/S of pneumothorax if dry needled over a lung field, and to seek immediate medical attention should they occur. Patient verbalized understanding of these instructions and education.  Patient Consent Given: Yes Education handout provided: Yes Muscles treated: Right gluteal 3 spots using 0.30 X75 needle left gluteal 2 spots using a 0.30 x 75 needle Treatment response/outcome: Good twitch response; immediate pain relief.  7/10 Manual:  Trigger point release to gluteals and low back; side lying roller to hips and IT band; roller to  anterior hip   Supine march 2 x 20 Supine hip abduction 2 x 20 red Short arc quad 2 x 20 bilateral  Standing weight shift x 20 2 inch step up 2 x 10 forward 2 x 10 lateral  LAQ 2x15 red  Hamstring curl 2x15 red    PATIENT EDUCATION:  Education details: reviewed HEP, symptom management  Person educated: Patient Education method: Explanation, Demonstration, Tactile cues, Verbal cues, and Handouts Education comprehension: verbalized understanding, returned demonstration, verbal cues required, tactile cues required, and needs further education  HOME EXERCISE PROGRAM: Has previous HEP. Will review next visit  ASSESSMENT:  CLINICAL IMPRESSION: Despite baseline pain the patient tolerated treatment well today.  Therapy focused on manual therapy to reduce trigger points in low back.  She will be having injections later.  She was able to tolerate mat seated exercises.  She reported improved pain following treatment.  She would benefit from further skilled therapy to continue to maintain general mobility.  Will progress her exercises depending on her tolerance to shot.    OBJECTIVE IMPAIRMENTS: Abnormal gait, decreased activity tolerance, decreased balance, decreased endurance, decreased knowledge of use of DME, decreased mobility, difficulty walking, decreased ROM, decreased strength, increased muscle spasms, and pain.   ACTIVITY LIMITATIONS: carrying, lifting, bending, standing, squatting, sleeping, stairs, transfers, and locomotion level  PARTICIPATION LIMITATIONS: meal prep, cleaning, laundry, driving, shopping, community activity, and yard work  PERSONAL FACTORS: Right Hip replacement 2022, Low back Pain, MS, TIA October 2023; MI, November 2022; CAD, Sjorgens syndrome are also affecting patient's functional outcome. MS  REHAB POTENTIAL: Good  CLINICAL DECISION MAKING: Evolving/moderate complexity declining mobility   EVALUATION COMPLEXITY: Moderate   GOALS: Goals reviewed with  patient? No  SHORT TERM GOALS: Target date: 09/29/2022    Patient will increase gross bilateral lower extremity strength by 5 pounds Baseline: Goal status: declined since last eval. Will continue to monitor   2.  Patient will increase lumbar flexion by 10 degrees Baseline:  Goal status:  mild improvement but still painful   3.  Patient will be independent with basic after exercise program and stretching program Baseline:  Goal status: has basic HEP    LONG TERM GOALS: Target date: 09/29/2022     Patient will stand for greater than 30 minutes without increased pain Baseline:  Goal status: improving 5/15   2.  Patient will pick item up off the floor without increased low back pain Baseline:  Goal status:still having pain 5/15  3.  Patient will ambulate community distances without report of increased low back pain Baseline:  Goal status:limited by pain but maintaining 7/25  4.  Patient will report improved ability to sleep through the night secondary to back pain Baseline:  Goal status: continues to have pain at night 7/25    PLAN:  PT FREQUENCY: 1x/week  PT DURATION: 10 weeks   PLANNED INTERVENTIONS: Therapeutic exercises, Therapeutic activity, Neuromuscular re-education, Balance training, Gait training, Patient/Family education, Self Care, Joint mobilization, Stair training, DME instructions, Aquatic Therapy, Dry Needling, Spinal mobilization, Cryotherapy, Moist heat, Ionotophoresis 4mg /ml Dexamethasone, and Manual therapy.  PLAN FOR NEXT SESSION: Consider manual therapy to the back and hips, consider trigger point dry needling to back and hips.  Reviewed basic HEP.  Begin active strengthening of hips and core.  Begin gait training and functional mobility training.   Dessie Coma, PT 10/28/2022, 10:43 AM

## 2023-02-19 ENCOUNTER — Encounter (HOSPITAL_BASED_OUTPATIENT_CLINIC_OR_DEPARTMENT_OTHER): Payer: Self-pay | Admitting: Physical Therapy

## 2023-02-23 DIAGNOSIS — H00021 Hordeolum internum right upper eyelid: Secondary | ICD-10-CM | POA: Diagnosis not present

## 2023-02-26 ENCOUNTER — Ambulatory Visit (HOSPITAL_BASED_OUTPATIENT_CLINIC_OR_DEPARTMENT_OTHER): Payer: Medicare Other | Admitting: Physical Therapy

## 2023-03-03 ENCOUNTER — Other Ambulatory Visit: Payer: Self-pay | Admitting: Neurology

## 2023-03-03 MED ORDER — CLONAZEPAM 0.5 MG PO TABS: 0.5000 mg | ORAL_TABLET | Freq: Every day | ORAL | 1 refills | Status: DC

## 2023-03-03 NOTE — Telephone Encounter (Signed)
Last seen on 10/20/22 Follow up scheduled on 05/04/23 Last filled on 12/09/22 #90 tablets (90 day supply) Rx pending to be signed

## 2023-03-03 NOTE — Telephone Encounter (Signed)
Pt is requesting a refill for clonazePAM (KLONOPIN) 0.5 MG tablet.  Pharmacy: [facility] [phone]

## 2023-03-11 ENCOUNTER — Encounter (HOSPITAL_BASED_OUTPATIENT_CLINIC_OR_DEPARTMENT_OTHER): Payer: Self-pay | Admitting: Physical Therapy

## 2023-03-11 ENCOUNTER — Ambulatory Visit (HOSPITAL_BASED_OUTPATIENT_CLINIC_OR_DEPARTMENT_OTHER): Payer: Medicare Other | Attending: Physical Medicine and Rehabilitation | Admitting: Physical Therapy

## 2023-03-11 DIAGNOSIS — R262 Difficulty in walking, not elsewhere classified: Secondary | ICD-10-CM | POA: Insufficient documentation

## 2023-03-11 DIAGNOSIS — M5459 Other low back pain: Secondary | ICD-10-CM | POA: Diagnosis not present

## 2023-03-11 DIAGNOSIS — R252 Cramp and spasm: Secondary | ICD-10-CM | POA: Insufficient documentation

## 2023-03-11 DIAGNOSIS — M6281 Muscle weakness (generalized): Secondary | ICD-10-CM | POA: Diagnosis not present

## 2023-03-11 DIAGNOSIS — M5416 Radiculopathy, lumbar region: Secondary | ICD-10-CM | POA: Diagnosis not present

## 2023-03-11 NOTE — Therapy (Signed)
OUTPATIENT PHYSICAL THERAPY THORACOLUMBAR Treatment    Patient Name: Dawn [name] MRN: [id number] DOB:1946/03/17, 77 y.o., female Today's Date: 03/11/2023  END OF SESSION:  PT End of Session - 03/11/23 1338     Visit Number 26    Number of Visits 34    Date for PT Re-Evaluation 04/23/23    Authorization Type progress note done at visit 24  next at 34    PT Start Time 1015    PT Stop Time 1058    PT Time Calculation (min) 43 min    Activity Tolerance Patient tolerated treatment well    Behavior During Therapy [facility] for tasks assessed/performed                Past Medical History:  Diagnosis Date   Allergy    CAD (coronary artery disease)    s/p DES to RCA in 06/2021   Dry eyes    Endometrial polyp    Essential hypertension 08/25/2018   GERD (gastroesophageal reflux disease)    History of hiatal hernia    History of kidney stones    Hot flashes, menopausal 10/13/2011   Estradiol started    Hyperlipidemia    Jaundice as teenager   no problems since   Memory loss 09/21/2019     1/2 of feet numb all the time   Multiple sclerosis (HCC) dx 2001   Neuropathy    bilateral feet   Osteoarthritis    Sjogren's syndrome (HCC) dx oct 2021   sore muscvles, dry mouth and eyes   Stroke (HCC)    Vertigo    Vision abnormalities    Past Surgical History:  Procedure Laterality Date   ABDOMINAL HYSTERECTOMY  2002   partial   COLONOSCOPY  01/27/2022   2 day prep   colonscopy  2011   CORONARY STENT INTERVENTION N/A 07/18/2021   Procedure: CORONARY STENT INTERVENTION;  Surgeon: Corky Crafts, MD;  Location: [facility]ASIVE [facility];  Service: Cardiovascular;  Laterality: N/A;   DILATATION & CURETTAGE/HYSTEROSCOPY WITH MYOSURE N/A 06/08/2020   Procedure: DILATATION & CURETTAGE/HYSTEROSCOPY/Polypectomy WITH MYOSURE;  Surgeon: Toy Baker, DO;  Location:  SURGERY CENTER;  Service: Gynecology;  Laterality: N/A;   LEFT HEART CATH AND CORONARY ANGIOGRAPHY N/A  07/18/2021   Procedure: LEFT HEART CATH AND CORONARY ANGIOGRAPHY;  Surgeon: Corky Crafts, MD;  Location: [facility] INV[facility] CV [facility];  Service: Cardiovascular;  Laterality: N/A;   LUMBAR FUSION  2001   TOTAL HIP ARTHROPLASTY Right 01/17/2021   Procedure: TOTAL HIP ARTHROPLASTY ANTERIOR APPROACH;  Surgeon: Samson Frederic, MD;  Location: [facility];  Service: Orthopedics;  Laterality: Right;   UPPER GI ENDOSCOPY  yrs ago   Patient Active Problem List   Diagnosis Date Noted   Positive colorectal cancer screening using Cologuard test 11/14/2021   Coronary artery disease    Statin intolerance 04/19/2021   Osteoarthritis of right hip 01/17/2021   Status post THR (total hip replacement) 01/17/2021   Chronic bilateral low back pain without sciatica 10/02/2020   Chronic pain syndrome 06/20/2020   Post laminectomy syndrome 06/20/2020   Sjogren's disease (HCC) 05/23/2020   Primary osteoarthritis of both hands 05/23/2020   Primary osteoarthritis of both feet 05/23/2020   Other secondary scoliosis, lumbar region 12/22/2019   Spondylosis without myelopathy or radiculopathy, lumbar region 12/22/2019   Cervical radiculopathy 10/18/2019   Numbness 09/21/2019   Bilateral carpal tunnel syndrome 09/21/2019   High risk medication use 09/21/2019   Memory loss 09/21/2019  Essential hypertension 08/25/2018   Abnormal SPEP 02/19/2018   Hand pain 02/20/2017   Multiple joint pain 02/18/2017   Disturbed cognition 06/25/2016   Sciatica, right side 10/30/2015   Chronic fatigue 08/04/2014   Gait disturbance 08/04/2014   Unspecified visual disturbance 01/31/2013   Transient vision disturbance 01/31/2013   Colon polyps 11/03/2011   POSTHERPETIC NEURALGIA 10/03/2009   DISPLCMT LUMBAR INTERVERT DISC W/O MYELOPATHY 09/05/2009   RENAL CALCULUS, RECURRENT 09/04/2009   Hyperlipidemia 08/31/2009   Multiple sclerosis (HCC) 08/31/2009   ALLERGIC RHINITIS 08/31/2009  Progress Note Reporting Period 5/15/12024to  02/12/2023  See note below for Objective Data and Assessment of Progress/Goals.     PCP: Iantha Fallen NP  REFERRING PROVIDER: Dr Romero Belling   REFERRING DIAG: Low Back Pain   Rationale for Evaluation and Treatment: Rehabilitation  THERAPY DIAG:  Difficulty in walking, not elsewhere classified  Cramp and spasm  Other low back pain  Muscle weakness (generalized)  ONSET DATE: About 11 days ago her nerve abiliation wore off.   SUBJECTIVE:                                                                                                                                                                                           SUBJECTIVE STATEMENT: The patient had her injections. She dons't feel like they worked. She may have ore today.  PERTINENT HISTORY:  Right Hip replacement 2022, Low back Pain, MS, TIA October 2023; MI, November 2022; CAD, Sjorgens syndrome  PAIN:  Are you having pain? Yes: NPRS scale: 7/10 Pain location:umbar spine Pain description: aching  Aggravating factors: standing and walking  Relieving factors: rest    PRECAUTIONS: None  WEIGHT BEARING RESTRICTIONS: No  FALLS:  Has patient fallen in last 6 months? 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   LUMBAR ROM:   AROM eval 5/15    Flexion Limited 50 % with pain  Mild improvement but still about 50%  Improved  pain but still limited  Extension No limit     Right lateral flexion     Left lateral flexion     Right rotation Limited 50% with pain     Left rotation Limited 50% with pain      (Blank rows = not tested)  LOWER EXTREMITY ROM:     Passive  Right eval Left eval Right  Right  7/24  Hip flexion 105 90 105 110 with pain at end range   Hip extension   10 degrees from neutral before manual today. Too neutral after stretching    Hip abduction      Hip adduction      Hip internal rotation painful Painful  Painful  Full with less pain   Hip external rotation   20 degrees with pain  45 with pain   Knee flexion      Knee extension      Ankle dorsiflexion      Ankle plantarflexion      Ankle inversion      Ankle eversion       (Blank rows = not tested)  LOWER EXTREMITY MMT:    MMT Right eval Left eval Right  3/26 Left  3/26 Right 5/15 Left 5/15 Right 7/24 Left 7/24  Hip flexion 13.1 12.3 15.1 26.1 18.2 26.2 11.3 19.1  Hip extension          Hip abduction 11.5 10.1 13.7 24.4 15.9 24 12.6 17.8  Hip adduction          Hip internal rotation          Hip external rotation          Knee flexion          Knee extension 14.0 13.3 17.5 33.3 20 38.2 19.4 18.3  Ankle dorsiflexion          Ankle plantarflexion          Ankle inversion          Ankle eversion           (Blank rows = not tested)  FUNCTIONAL TESTS:  5 times sit to stand: 28 sec  5/15 5 times sit to stand test 20 seconds 7/25 24 sec   3-minute walk test 150 feet without requirement of seated rest break 5/15  7/25 3 min walk test 200'   GAIT:  TODAY'S TREATMENT:                                                                                                                              DATE:  8/21 Manual:  Trigger point release to gluteals and low back; side lying roller to hips and IT band; roller to anterior hip; LAD left hip    Supine march 3x10  SAQ 2lbs 3x10  LTR x20  Bridge 3x10   LAQ 2x15 1.5 lbs bilateral    7/31 Manual:  Trigger point release to gluteals and  low back; side lying roller to hips and IT band; roller to anterior hip   Supine march 3x10  SAQ 2lbs 3x10  LTR x20  Bridge 3x10     Seated clamshell 3x15 red  LAq 2x15 red each leg   7/25 Manual:  Trigger point release to gluteals and low back; side lying roller to hips and IT band; roller to anterior hip   Seated clamshell 3x15 red  LAq 2x15 red each leg   Decompression:  90/90 on ball press x20  DKTC 20   LTR x20   Tests and measures.  Trigger Point Dry-Needling  Treatment instructions: Expect mild to moderate muscle soreness. S/S of pneumothorax if dry needled over a lung field, and to seek immediate medical attention should they occur. Patient verbalized understanding of these instructions and education.  Patient Consent Given: Yes Education handout provided: Yes Muscles treated: Right gluteal 3 spots using 0.30 X75 needle left gluteal 2 spots using a 0.30 x 75 needle Treatment response/outcome: Good twitch response; immediate pain relief.   7/18 Manual:  Trigger point release to gluteals and low back; side lying roller to hips and IT band; roller to anterior hip   Seated clamshell 3x15 red  LAq 2x15 red each leg   Decompression:  90/90 on ball press x20  DKTC 20   LTR x20   Tests and measures.  Trigger Point Dry-Needling  Treatment instructions: Expect mild to moderate muscle soreness. S/S of pneumothorax if dry needled over a lung field, and to seek immediate medical attention should they occur. Patient verbalized understanding of these instructions and education.  Patient Consent Given: Yes Education handout provided: Yes Muscles treated: Right gluteal 3 spots using 0.30 X75 needle left gluteal 2 spots using a 0.30 x 75 needle Treatment response/outcome: Good twitch response; immediate pain  relief.  7/10 Manual:  Trigger point release to gluteals and low back; side lying roller to hips and IT band; roller to anterior hip   Supine march 2 x 20 Supine hip abduction 2 x 20 red Short arc quad 2 x 20 bilateral  Standing weight shift x 20 2 inch step up 2 x 10 forward 2 x 10 lateral  LAQ 2x15 red  Hamstring curl 2x15 red    PATIENT EDUCATION:  Education details: reviewed HEP, symptom management  Person educated: Patient Education method: Explanation, Demonstration, Tactile cues, Verbal cues, and Handouts Education comprehension: verbalized understanding, returned demonstration, verbal cues required, tactile cues required, and needs further education  HOME EXERCISE PROGRAM: Has previous HEP. Will review next visit  ASSESSMENT:  CLINICAL IMPRESSION: Therapy focused on aggressive trigger point release to low back and gluteal area. She reported a significant improvement in pain with TP release. We continue to work on exercises. We added weiht to her LAQ today. We will continue to progress as tolerated.    OBJECTIVE IMPAIRMENTS: Abnormal gait, decreased activity tolerance, decreased balance, decreased endurance, decreased knowledge of use of DME, decreased mobility, difficulty walking, decreased ROM, decreased strength, increased muscle spasms, and pain.   ACTIVITY LIMITATIONS: carrying, lifting, bending, standing, squatting, sleeping, stairs, transfers, and locomotion level  PARTICIPATION LIMITATIONS: meal prep, cleaning, laundry, driving, shopping, community activity, and yard work  PERSONAL FACTORS: Right Hip replacement 2022, Low back Pain, MS, TIA October 2023; MI, November 2022; CAD, Sjorgens syndrome are also affecting patient's functional outcome. MS  REHAB POTENTIAL: Good  CLINICAL DECISION MAKING: Evolving/moderate complexity declining mobility   EVALUATION COMPLEXITY: Moderate   GOALS: Goals reviewed with patient?  No  SHORT TERM GOALS: Target date:  09/29/2022    Patient will increase gross bilateral lower extremity strength by 5 pounds Baseline: Goal status: declined since last eval. Will continue to monitor   2.  Patient will increase lumbar flexion by 10 degrees Baseline:  Goal status:  mild improvement but still painful   3.  Patient will be independent with basic after exercise program and stretching program Baseline:  Goal status: has basic HEP    LONG TERM GOALS: Target date: 09/29/2022     Patient will stand for greater than 30 minutes without increased pain Baseline:  Goal status: improving 5/15   2.  Patient will pick item up off the floor without increased low back pain Baseline:  Goal status:still having pain 5/15  3.  Patient will ambulate community distances without report of increased low back pain Baseline:  Goal status:limited by pain but maintaining 7/25  4.  Patient will report improved ability to sleep through the night secondary to back pain Baseline:  Goal status: continues to have pain at night 7/25    PLAN:  PT FREQUENCY: 1x/week  PT DURATION: 10 weeks   PLANNED INTERVENTIONS: Therapeutic exercises, Therapeutic activity, Neuromuscular re-education, Balance training, Gait training, Patient/Family education, Self Care, Joint mobilization, Stair training, DME instructions, Aquatic Therapy, Dry Needling, Spinal mobilization, Cryotherapy, Moist heat, Ionotophoresis 4mg /ml Dexamethasone, and Manual therapy.  PLAN FOR NEXT SESSION: Consider manual therapy to the back and hips, consider trigger point dry needling to back and hips.  Reviewed basic HEP.  Begin active strengthening of hips and core.  Begin gait training and functional mobility training.   Dessie Coma, PT 10/28/2022, 2:10 PM

## 2023-03-18 ENCOUNTER — Encounter (HOSPITAL_BASED_OUTPATIENT_CLINIC_OR_DEPARTMENT_OTHER): Payer: Self-pay | Admitting: Physical Therapy

## 2023-03-18 ENCOUNTER — Ambulatory Visit (HOSPITAL_BASED_OUTPATIENT_CLINIC_OR_DEPARTMENT_OTHER): Payer: Medicare Other | Admitting: Physical Therapy

## 2023-03-18 DIAGNOSIS — R262 Difficulty in walking, not elsewhere classified: Secondary | ICD-10-CM | POA: Diagnosis not present

## 2023-03-18 DIAGNOSIS — M6281 Muscle weakness (generalized): Secondary | ICD-10-CM | POA: Diagnosis not present

## 2023-03-18 DIAGNOSIS — R252 Cramp and spasm: Secondary | ICD-10-CM

## 2023-03-18 DIAGNOSIS — M5459 Other low back pain: Secondary | ICD-10-CM

## 2023-03-18 NOTE — Therapy (Unsigned)
OUTPATIENT PHYSICAL THERAPY THORACOLUMBAR Treatment    Patient Name: Dawn [name] MRN: [id number] DOB:[birth year], 77 y.o., female Today's Date: 03/18/2023  END OF SESSION:  PT End of Session - 03/18/23 1025     Visit Number 27    Number of Visits 34    Date for PT Re-Evaluation 04/23/23    Authorization Type progress note done at visit 24  next at 34    PT Start Time 1015    PT Stop Time 1059    PT Time Calculation (min) 44 min    Activity Tolerance Patient tolerated treatment well    Behavior During Therapy [facility] for tasks assessed/performed                Past Medical History:  Diagnosis Date   Allergy    CAD (coronary artery disease)    s/p DES to RCA in 06/2021   Dry eyes    Endometrial polyp    Essential hypertension 08/25/2018   GERD (gastroesophageal reflux disease)    History of hiatal hernia    History of kidney stones    Hot flashes, menopausal 10/13/2011   Estradiol started    Hyperlipidemia    Jaundice as teenager   no problems since   Memory loss 09/21/2019     1/2 of feet numb all the time   Multiple sclerosis (HCC) dx 2001   Neuropathy    bilateral feet   Osteoarthritis    Sjogren's syndrome (HCC) dx oct 2021   sore muscvles, dry mouth and eyes   Stroke (HCC)    Vertigo    Vision abnormalities    Past Surgical History:  Procedure Laterality Date   ABDOMINAL HYSTERECTOMY  2002   partial   COLONOSCOPY  01/27/2022   2 day prep   colonscopy  2011   CORONARY STENT INTERVENTION N/A 07/18/2021   Procedure: CORONARY STENT INTERVENTION;  Surgeon: Corky Crafts, MD;  Location: [facility];  Service: Cardiovascular;  Laterality: N/A;   DILATATION & CURETTAGE/HYSTEROSCOPY WITH MYOSURE N/A 06/08/2020   Procedure: DILATATION & CURETTAGE/HYSTEROSCOPY/Polypectomy WITH MYOSURE;  Surgeon: Toy Baker, DO;  Location: [facility];  Service: Gynecology;  Laterality: N/A;   LEFT HEART CATH AND CORONARY ANGIOGRAPHY N/A  07/18/2021   Procedure: LEFT HEART CATH AND CORONARY ANGIOGRAPHY;  Surgeon: Corky Crafts, MD;  Location: [facility];  Service: Cardiovascular;  Laterality: N/A;   LUMBAR FUSION  2001   TOTAL HIP ARTHROPLASTY Right 01/17/2021   Procedure: TOTAL HIP ARTHROPLASTY ANTERIOR APPROACH;  Surgeon: Samson Frederic, MD;  Location: [facility];  Service: Orthopedics;  Laterality: Right;   UPPER GI ENDOSCOPY  yrs ago   Patient Active Problem List   Diagnosis Date Noted   Positive colorectal cancer screening using Cologuard test 11/14/2021   Coronary artery disease    Statin intolerance 04/19/2021   Osteoarthritis of right hip 01/17/2021   Status post THR (total hip replacement) 01/17/2021   Chronic bilateral low back pain without sciatica 10/02/2020   Chronic pain syndrome 06/20/2020   Post laminectomy syndrome 06/20/2020   Sjogren's disease (HCC) 05/23/2020   Primary osteoarthritis of both hands 05/23/2020   Primary osteoarthritis of both feet 05/23/2020   Other secondary scoliosis, lumbar region 12/22/2019   Spondylosis without myelopathy or radiculopathy, lumbar region 12/22/2019   Cervical radiculopathy 10/18/2019   Numbness 09/21/2019   Bilateral carpal tunnel syndrome 09/21/2019   High risk medication use 09/21/2019   Memory loss 09/21/2019  Essential hypertension 08/25/2018   Abnormal SPEP 02/19/2018   Hand pain 02/20/2017   Multiple joint pain 02/18/2017   Disturbed cognition 06/25/2016   Sciatica, right side 10/30/2015   Chronic fatigue 08/04/2014   Gait disturbance 08/04/2014   Unspecified visual disturbance 01/31/2013   Transient vision disturbance 01/31/2013   Colon polyps 11/03/2011   POSTHERPETIC NEURALGIA 10/03/2009   DISPLCMT LUMBAR INTERVERT DISC W/O MYELOPATHY 09/05/2009   RENAL CALCULUS, RECURRENT 09/04/2009   Hyperlipidemia 08/31/2009   Multiple sclerosis (HCC) 08/31/2009   ALLERGIC RHINITIS 08/31/2009  Progress Note Reporting Period 5/15/12024to  02/12/2023  See note below for Objective Data and Assessment of Progress/Goals.     PCP: Iantha Fallen NP  REFERRING PROVIDER: Dr Romero Belling   REFERRING DIAG: Low Back Pain   Rationale for Evaluation and Treatment: Rehabilitation  THERAPY DIAG:  No diagnosis found.  ONSET DATE: About 11 days ago her nerve abiliation wore off.   SUBJECTIVE:                                                                                                                                                                                           SUBJECTIVE STATEMENT: The patient continues to have significant pain in her lower back. Over the weekend she was very sore and had to spend a lot of time on ice.     PERTINENT HISTORY:  Right Hip replacement 2022, Low back Pain, MS, TIA October 2023; MI, November 2022; CAD, Sjorgens syndrome  PAIN:  Are you having pain? Yes: NPRS scale: 7/10 Pain location:umbar spine Pain description: aching  Aggravating factors: standing and walking  Relieving factors: rest    PRECAUTIONS: None  WEIGHT BEARING RESTRICTIONS: No  FALLS:  Has patient fallen in last 6 months? 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   LUMBAR ROM:   AROM eval 5/15   Flexion Limited 50 % with pain  Mild  improvement but still about 50%  Improved pain  but still limited  Extension No limit     Right lateral flexion     Left lateral flexion     Right rotation Limited 50% with pain     Left rotation Limited 50% with pain      (Blank rows = not tested)  LOWER EXTREMITY ROM:     Passive  Right eval Left eval Right  Right  7/24  Hip flexion 105 90 105 110 with pain at end range   Hip extension   10 degrees from neutral before manual today. Too neutral after stretching    Hip abduction      Hip adduction      Hip internal rotation painful Painful  Painful  Full with less pain   Hip external rotation   20 degrees with pain  45 with pain   Knee flexion      Knee extension      Ankle dorsiflexion      Ankle plantarflexion      Ankle inversion      Ankle eversion       (Blank rows = not tested)  LOWER EXTREMITY MMT:    MMT Right eval Left eval Right  3/26 Left  3/26 Right 5/15 Left 5/15 Right 7/24 Left 7/24  Hip flexion 13.1 12.3 15.1 26.1 18.2 26.2 11.3 19.1  Hip extension          Hip abduction 11.5 10.1 13.7 24.4 15.9 24 12.6 17.8  Hip adduction          Hip internal rotation          Hip external rotation          Knee flexion          Knee extension 14.0 13.3 17.5 33.3 20 38.2 19.4 18.3  Ankle dorsiflexion          Ankle plantarflexion          Ankle inversion          Ankle eversion           (Blank rows = not tested)  FUNCTIONAL TESTS:  5 times sit to stand: 28 sec  5/15 5 times sit to stand test 20 seconds 7/25 24 sec   3-minute walk test 150 feet without requirement of seated rest break 5/15  7/25 3 min walk test 200'   GAIT:  TODAY'S TREATMENT:                                                                                                                              DATE:  8/28 Trigger Point Dry-Needling  Treatment instructions: Expect mild to moderate muscle soreness. S/S of pneumothorax if dry needled over a lung field, and to seek immediate  medical attention should they occur. Patient verbalized understanding of these instructions and education.  Patient Consent Given: Yes Education handout provided: Yes Muscles treated: Right gluteal 3 spots using 0.30 X75 needle left gluteal  2 spots using a 0.30 x 75 needle Using e-stim left and right at a .30x75 needle 6 pps intesity between 2-4   Treatment response/outcome: Good twitch response; immediate pain relief.  Trigger point release to gluteals and lumbar spine in prone.    8/21 Manual:  Trigger point release to gluteals and low back; side lying roller to hips and IT band; roller to anterior hip; LAD left hip   Supine march 3x10  SAQ 2lbs 3x10  LTR x20  Bridge 3x10   LAQ 2x15 1.5 lbs bilateral     Trigger Point Dry-Needling  Treatment instructions: Expect mild to moderate muscle soreness. S/S of pneumothorax if dry needled over a lung field, and to seek immediate medical attention should they occur. Patient verbalized understanding of these instructions and education.  Patient Consent Given: Yes Education handout provided: Yes Muscles treated: Right gluteal 3 spots using 0.30 X75 needle left gluteal 2 spots using a 0.30 x 75 needle Treatment response/outcome: Good twitch response; immediate pain relief.   7/18 Manual:  Trigger point release to gluteals and low back; side lying roller to hips and IT band; roller to anterior hip   Seated clamshell 3x15 red  LAq 2x15 red each leg   Decompression:  90/90 on ball press x20  DKTC 20   LTR x20   Tests and measures.  Trigger Point Dry-Needling  Treatment instructions: Expect mild to moderate muscle soreness. S/S of pneumothorax if dry needled over a lung field, and to seek immediate medical attention should they occur. Patient verbalized understanding of these instructions and education.  Patient Consent Given: Yes Education handout provided: Yes Muscles treated: Right gluteal 3 spots using 0.30 X75 needle left  gluteal 2 spots using a 0.30 x 75 needle Treatment response/outcome: Good twitch response; immediate pain relief.   PATIENT EDUCATION:  Education details: reviewed HEP, symptom management  Person educated: Patient Education method: Explanation, Demonstration, Tactile cues, Verbal cues, and Handouts Education comprehension: verbalized understanding, returned demonstration, verbal cues required, tactile cues required, and needs further education  HOME EXERCISE PROGRAM: Has previous HEP. Will review next visit  ASSESSMENT:  CLINICAL IMPRESSION: Therapy trialed e-stim with needling today in bilateral gluteals. She reported improved pain. We just focused on manual therapy to reduce pain. She will try to go to the pool tomorrow.   OBJECTIVE IMPAIRMENTS: Abnormal gait, decreased activity tolerance, decreased balance, decreased endurance, decreased knowledge of use of DME, decreased mobility, difficulty walking, decreased ROM, decreased strength, increased muscle spasms, and pain.   ACTIVITY LIMITATIONS: carrying, lifting, bending, standing, squatting, sleeping, stairs, transfers, and locomotion level  PARTICIPATION LIMITATIONS: meal prep, cleaning, laundry, driving, shopping, community activity, and yard work  PERSONAL FACTORS: Right Hip replacement 2022, Low back Pain, MS, TIA October 2023; MI, November 2022; CAD, Sjorgens syndrome are also affecting patient's functional outcome. MS  REHAB POTENTIAL: Good  CLINICAL DECISION MAKING: Evolving/moderate complexity declining mobility   EVALUATION COMPLEXITY: Moderate   GOALS: Goals reviewed with patient? No  SHORT TERM GOALS: Target date: 09/29/2022    Patient will increase gross bilateral lower extremity strength by 5 pounds Baseline: Goal status: declined since last eval. Will continue to monitor   2.  Patient will increase lumbar flexion by 10 degrees Baseline:  Goal status:  mild improvement but still painful   3.  Patient will  be independent with basic after exercise program and stretching program Baseline:  Goal status: has basic HEP    LONG TERM GOALS: Target date: 09/29/2022  Patient will stand for greater than 30 minutes without increased pain Baseline:  Goal status: improving 5/15   2.  Patient will pick item up off the floor without increased low back pain Baseline:  Goal status:still having pain 5/15  3.  Patient will ambulate community distances without report of increased low back pain Baseline:  Goal status:limited by pain but maintaining 7/25  4.  Patient will report improved ability to sleep through the night secondary to back pain Baseline:  Goal status: continues to have pain at night 7/25    PLAN:  PT FREQUENCY: 1x/week  PT DURATION: 10 weeks   PLANNED INTERVENTIONS: Therapeutic exercises, Therapeutic activity, Neuromuscular re-education, Balance training, Gait training, Patient/Family education, Self Care, Joint mobilization, Stair training, DME instructions, Aquatic Therapy, Dry Needling, Spinal mobilization, Cryotherapy, Moist heat, Ionotophoresis 4mg /ml Dexamethasone, and Manual therapy.  PLAN FOR NEXT SESSION: Consider manual therapy to the back and hips, consider trigger point dry needling to back and hips.  Reviewed basic HEP.  Begin active strengthening of hips and core.  Begin gait training and functional mobility training.   Dessie Coma, PT 10/28/2022, 2:28 PM

## 2023-03-19 ENCOUNTER — Encounter (HOSPITAL_BASED_OUTPATIENT_CLINIC_OR_DEPARTMENT_OTHER): Payer: Self-pay | Admitting: Physical Therapy

## 2023-03-25 ENCOUNTER — Ambulatory Visit (HOSPITAL_BASED_OUTPATIENT_CLINIC_OR_DEPARTMENT_OTHER): Payer: Medicare Other | Attending: Physical Medicine and Rehabilitation | Admitting: Physical Therapy

## 2023-03-25 DIAGNOSIS — M5459 Other low back pain: Secondary | ICD-10-CM | POA: Insufficient documentation

## 2023-03-25 DIAGNOSIS — R252 Cramp and spasm: Secondary | ICD-10-CM | POA: Diagnosis not present

## 2023-03-25 DIAGNOSIS — M6281 Muscle weakness (generalized): Secondary | ICD-10-CM | POA: Insufficient documentation

## 2023-03-25 DIAGNOSIS — R262 Difficulty in walking, not elsewhere classified: Secondary | ICD-10-CM | POA: Diagnosis not present

## 2023-03-25 NOTE — Therapy (Signed)
OUTPATIENT PHYSICAL THERAPY THORACOLUMBAR Treatment    Patient Name: LIONOR HILDE MRN: [id number] DOB:1945/11/17, 77 y.o., female Today's Date: 03/25/2023  END OF SESSION:       Past Medical History:  Diagnosis Date   Allergy    CAD (coronary artery disease)    s/p DES to RCA in 06/2021   Dry eyes    Endometrial polyp    Essential hypertension 08/25/2018   GERD (gastroesophageal reflux disease)    History of hiatal hernia    History of kidney stones    Hot flashes, menopausal 10/13/2011   Estradiol started    Hyperlipidemia    Jaundice as teenager   no problems since   Memory loss 09/21/2019     1/2 of feet numb all the time   Multiple sclerosis (HCC) dx 2001   Neuropathy    bilateral feet   Osteoarthritis    Sjogren's syndrome (HCC) dx oct 2021   sore muscvles, dry mouth and eyes   Stroke (HCC)    Vertigo    Vision abnormalities    Past Surgical History:  Procedure Laterality Date   ABDOMINAL HYSTERECTOMY  2002   partial   COLONOSCOPY  01/27/2022   2 day prep   colonscopy  2011   CORONARY STENT INTERVENTION N/A 07/18/2021   Procedure: CORONARY STENT INTERVENTION;  Surgeon: Corky Crafts, MD;  Location: [facility];  Service: Cardiovascular;  Laterality: N/A;   DILATATION & CURETTAGE/HYSTEROSCOPY WITH MYOSURE N/A 06/08/2020   Procedure: DILATATION & CURETTAGE/HYSTEROSCOPY/Polypectomy WITH MYOSURE;  Surgeon: Toy Baker, DO;  Location: [facility];  Service: Gynecology;  Laterality: N/A;   LEFT HEART CATH AND CORONARY ANGIOGRAPHY N/A 07/18/2021   Procedure: LEFT HEART CATH AND CORONARY ANGIOGRAPHY;  Surgeon: Corky Crafts, MD;  Location: [facility];  Service: Cardiovascular;  Laterality: N/A;   LUMBAR FUSION  2001   TOTAL HIP ARTHROPLASTY Right 01/17/2021   Procedure: TOTAL HIP ARTHROPLASTY ANTERIOR APPROACH;  Surgeon: Samson Frederic, MD;  Location: [facility];  Service: Orthopedics;  Laterality: Right;   UPPER  GI ENDOSCOPY  yrs ago   Patient Active Problem List   Diagnosis Date Noted   Positive colorectal cancer screening using Cologuard test 11/14/2021   Coronary artery disease    Statin intolerance 04/19/2021   Osteoarthritis of right hip 01/17/2021   Status post THR (total hip replacement) 01/17/2021   Chronic bilateral low back pain without sciatica 10/02/2020   Chronic pain syndrome 06/20/2020   Post laminectomy syndrome 06/20/2020   Sjogren's disease (HCC) 05/23/2020   Primary osteoarthritis of both hands 05/23/2020   Primary osteoarthritis of both feet 05/23/2020   Other secondary scoliosis, lumbar region 12/22/2019   Spondylosis without myelopathy or radiculopathy, lumbar region 12/22/2019   Cervical radiculopathy 10/18/2019   Numbness 09/21/2019   Bilateral carpal tunnel syndrome 09/21/2019   High risk medication use 09/21/2019   Memory loss 09/21/2019   Essential hypertension 08/25/2018   Abnormal SPEP 02/19/2018   Hand pain 02/20/2017   Multiple joint pain 02/18/2017   Disturbed cognition 06/25/2016   Sciatica, right side 10/30/2015   Chronic fatigue 08/04/2014   Gait disturbance 08/04/2014   Unspecified visual disturbance 01/31/2013   Transient vision disturbance 01/31/2013   Colon polyps 11/03/2011   POSTHERPETIC NEURALGIA 10/03/2009   DISPLCMT LUMBAR INTERVERT DISC W/O MYELOPATHY 09/05/2009   RENAL CALCULUS, RECURRENT 09/04/2009   Hyperlipidemia 08/31/2009   Multiple sclerosis (HCC) 08/31/2009   ALLERGIC RHINITIS 08/31/2009  Progress Note Reporting  Period 5/15/12024to 02/12/2023  See note below for Objective Data and Assessment of Progress/Goals.     PCP: Iantha Fallen NP  REFERRING PROVIDER: Dr Romero Belling   REFERRING DIAG: Low Back Pain   Rationale for Evaluation and Treatment: Rehabilitation  THERAPY DIAG:  No diagnosis found.  ONSET DATE: About 11 days ago her nerve abiliation wore off.   SUBJECTIVE:                                                                                                                                                                                            SUBJECTIVE STATEMENT: The patient continues to have a progressive increase in low back pain. She had to wear an ice pack in today. She has had difficulty moving even short distances.   PERTINENT HISTORY:  Right Hip replacement 2022, Low back Pain, MS, TIA October 2023; MI, November 2022; CAD, Sjorgens syndrome  PAIN:  Are you having pain? Yes: NPRS scale: 7/10 Pain location:umbar spine Pain description: aching  Aggravating factors: standing and walking  Relieving factors: rest    PRECAUTIONS: None  WEIGHT BEARING RESTRICTIONS: No  FALLS:  Has patient fallen in last 6 months? 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   LUMBAR ROM:   AROM eval 5/15   Flexion Limited 50 % with pain  Mild improvement but still about 50%  Improved pain but still limited  Extension No limit     Right lateral flexion     Left lateral flexion     Right rotation Limited 50% with pain     Left rotation Limited 50% with pain      (Blank rows = not tested)  LOWER EXTREMITY ROM:     Passive  Right eval Left eval Right  Right  7/24  Hip flexion 105 90 105 110 with pain at end range   Hip  extension   10 degrees from neutral before manual today. Too neutral  after stretching    Hip abduction      Hip adduction      Hip internal rotation painful Painful  Painful  Full with less pain   Hip external rotation   20 degrees with pain  45 with pain   Knee flexion      Knee extension      Ankle dorsiflexion      Ankle plantarflexion      Ankle inversion      Ankle eversion       (Blank rows = not tested)  LOWER EXTREMITY MMT:    MMT Right eval Left eval Right  3/26 Left  3/26 Right 5/15 Left 5/15 Right 7/24 Left 7/24  Hip flexion 13.1 12.3 15.1 26.1 18.2 26.2 11.3 19.1  Hip extension          Hip abduction 11.5 10.1 13.7 24.4 15.9 24 12.6 17.8  Hip adduction          Hip internal rotation          Hip external rotation          Knee flexion          Knee extension 14.0 13.3 17.5 33.3 20 38.2 19.4 18.3  Ankle dorsiflexion          Ankle plantarflexion          Ankle inversion          Ankle eversion           (Blank rows = not tested)  FUNCTIONAL TESTS:  5 times sit to stand: 28 sec  5/15 5 times sit to stand test 20 seconds 7/25 24 sec   3-minute walk test 150 feet without requirement of seated rest break 5/15  7/25 3 min walk test 200'   GAIT:  TODAY'S TREATMENT:                                                                                                                              DATE:  09/04 rig[place] Dry-Needling  Treatment instructions: Expect mild to moderate muscle soreness. S/S of pneumothorax if dry needled over a lung field, and to seek immediate medical attention should they occur. Patient verbalized understanding of these instructions and education.  Patient Consent Given: Yes Education handout provided: Yes Muscles treated: Right gluteal 3 spots using 0.30 X75 needle left gluteal 2 spots using a 0.30 x 75 needle 2 spots in each side of mid throacic t-8 through t-10  Treatment response/outcome: Good twitch response; immediate pain  relief.    8/28 Trigger Point Dry-Needling  Treatment instructions: Expect mild to moderate muscle soreness. S/S of pneumothorax if dry needled over a lung field, and to seek immediate medical attention should they occur. Patient verbalized understanding of these instructions and education.  Patient Consent Given: Yes Education handout provided: Yes Muscles treated: Right gluteal 3 spots using 0.30 X75 needle left gluteal 2 spots using a 0.30  x 75 needle Using e-stim left and right at a .30x75 needle 6 pps intesity between 2-4   Treatment response/outcome: Good twitch response; immediate pain relief.  Trigger point release to gluteals and lumbar spine in prone.    Bilateral er 2x10 yellow  Bilateral horizontal abduction 2x10 yellow  Bilateral flexion  2x10 yellow band     8/21 Manual:  Trigger point release to gluteals and low back; side lying roller to hips and IT band; roller to anterior hip; LAD left hip   Supine march 3x10  SAQ 2lbs 3x10  LTR x20  Bridge 3x10   LAQ 2x15 1.5 lbs bilateral     Trigger Point Dry-Needling  Treatment instructions: Expect mild to moderate muscle soreness. S/S of pneumothorax if dry needled over a lung field, and to seek immediate medical attention should they occur. Patient verbalized understanding of these instructions and education.  Patient Consent Given: Yes Education handout provided: Yes Muscles treated: Right gluteal 3 spots using 0.30 X75 needle left gluteal 2 spots using a 0.30 x 75 needle Treatment response/outcome: Good twitch response; immediate pain relief.   7/18 Manual:  Trigger point release to gluteals and low back; side lying roller to hips and IT band; roller to anterior hip   Seated clamshell 3x15 red  LAq 2x15 red each leg   Decompression:  90/90 on ball press x20  DKTC 20   LTR x20   Tests and measures.  Trigger Point Dry-Needling  Treatment instructions: Expect mild to moderate muscle soreness. S/S of  pneumothorax if dry needled over a lung field, and to seek immediate medical attention should they occur. Patient verbalized understanding of these instructions and education.  Patient Consent Given: Yes Education handout provided: Yes Muscles treated: Right gluteal 3 spots using 0.30 X75 needle left gluteal 2 spots using a 0.30 x 75 needle Treatment response/outcome: Good twitch response; immediate pain relief.   PATIENT EDUCATION:  Education details: reviewed HEP, symptom management  Person educated: Patient Education method: Explanation, Demonstration, Tactile cues, Verbal cues, and Handouts Education comprehension: verbalized understanding, returned demonstration, verbal cues required, tactile cues required, and needs further education  HOME EXERCISE PROGRAM: Has previous HEP. Will review next visit  ASSESSMENT:  CLINICAL IMPRESSION: The patient remained limited. She had improved pain following hwr treatment. We focused on manual therapy. She had pain higher in her t-spine today. We needed there and performed manual therapy. We reviewed an UE series for home. She will contact her MD 2nd to escalating pain levels.    OBJECTIVE IMPAIRMENTS: Abnormal gait, decreased activity tolerance, decreased balance, decreased endurance, decreased knowledge of use of DME, decreased mobility, difficulty walking, decreased ROM, decreased strength, increased muscle spasms, and pain.   ACTIVITY LIMITATIONS: carrying, lifting, bending, standing, squatting, sleeping, stairs, transfers, and locomotion level  PARTICIPATION LIMITATIONS: meal prep, cleaning, laundry, driving, shopping, community activity, and yard work  PERSONAL FACTORS: Right Hip replacement 2022, Low back Pain, MS, TIA October 2023; MI, November 2022; CAD, Sjorgens syndrome are also affecting patient's functional outcome. MS  REHAB POTENTIAL: Good  CLINICAL DECISION MAKING: Evolving/moderate complexity declining mobility   EVALUATION  COMPLEXITY: Moderate   GOALS: Goals reviewed with patient? No  SHORT TERM GOALS: Target date: 09/29/2022    Patient will increase gross bilateral lower extremity strength by 5 pounds Baseline: Goal status: declined since last eval. Will continue to monitor   2.  Patient will increase lumbar flexion by 10 degrees Baseline:  Goal status:  mild improvement but still painful  3.  Patient will be independent with basic after exercise program and stretching program Baseline:  Goal status: has basic HEP    LONG TERM GOALS: Target date: 09/29/2022     Patient will stand for greater than 30 minutes without increased pain Baseline:  Goal status: improving 5/15   2.  Patient will pick item up off the floor without increased low back pain Baseline:  Goal status:still having pain 5/15  3.  Patient will ambulate community distances without report of increased low back pain Baseline:  Goal status:limited by pain but maintaining 7/25  4.  Patient will report improved ability to sleep through the night secondary to back pain Baseline:  Goal status: continues to have pain at night 7/25    PLAN:  PT FREQUENCY: 1x/week  PT DURATION: 10 weeks   PLANNED INTERVENTIONS: Therapeutic exercises, Therapeutic activity, Neuromuscular re-education, Balance training, Gait training, Patient/Family education, Self Care, Joint mobilization, Stair training, DME instructions, Aquatic Therapy, Dry Needling, Spinal mobilization, Cryotherapy, Moist heat, Ionotophoresis 4mg /ml Dexamethasone, and Manual therapy.  PLAN FOR NEXT SESSION: Consider manual therapy to the back and hips, consider trigger point dry needling to back and hips.  Reviewed basic HEP.  Begin active strengthening of hips and core.  Begin gait training and functional mobility training.   Dessie Coma, PT 10/28/2022, 1:47 PM

## 2023-03-26 ENCOUNTER — Encounter: Payer: Self-pay | Admitting: Family

## 2023-03-26 ENCOUNTER — Ambulatory Visit (INDEPENDENT_AMBULATORY_CARE_PROVIDER_SITE_OTHER): Payer: Medicare Other | Admitting: Family

## 2023-03-26 VITALS — BP 130/80 | HR 62 | Temp 97.5°F | Resp 16 | Ht 62.0 in | Wt 181.0 lb

## 2023-03-26 DIAGNOSIS — M549 Dorsalgia, unspecified: Secondary | ICD-10-CM | POA: Diagnosis not present

## 2023-03-26 MED ORDER — DULOXETINE HCL 30 MG PO CPEP
30.0000 mg | ORAL_CAPSULE | Freq: Every day | ORAL | 0 refills | Status: DC
Start: 2023-03-26 — End: 2023-07-02

## 2023-03-26 NOTE — Progress Notes (Signed)
Provider: Richarda Blade FNP-C  Annitta Fifield, [name], NP  Patient Care Team: Malikai Gut, [name], NP as PCP - General (Family Medicine) Maisie Fus, MD as PCP - Cardiology (Cardiology) Valeria Batman, MD (Inactive) as Consulting Physician (Orthopedic Surgery) [name], Pearletha Furl, MD (Neurology) Krista Blue, [place], OD (Optometry) Lynden Ang, NP as Nurse Practitioner (Obstetrics and Gynecology) Janalyn Harder, MD (Inactive) as Consulting Physician (Dermatology) Romero Belling, MD as Referring Physician (Physical Medicine and Rehabilitation)  Extended Emergency Contact Information Primary Emergency Contact: [facility] Phone: [phone] Mobile Phone: [phone] Relation: Friend Secondary Emergency Contact: Cranford,Jane Mobile Phone: [phone] Relation: Friend  Code Status: Full Code  Goals of care: Advanced Directive information    03/26/2023    1:52 PM  Advanced Directives  Does Patient Have a Medical Advance Directive? Yes  Type of Estate agent of [place];Living will  Does patient want to make changes to medical advance directive? Yes (Inpatient - patient defers changing a medical advance directive at this time - Information given)  Copy of Healthcare Power of Attorney in Chart? Yes - validated most recent copy scanned in chart (See row information)     Chief Complaint  Patient presents with   Acute Visit    Back pain, increasing in intensity. Sees ortho for injections.    HPI:  Pt is a 77 y.o. female seen today for an acute visit for evaluation of worsening back pain since 6 days ago. Usually ice compressor helps but heat has been helpful.PT did massage and dry needling.states pain has moved from lower back to upper right.Took Tizidine which helped to sleep but did not help with the pain.  Has taken Tylenol,Tizidine   Past Medical History:  Diagnosis Date   Allergy    CAD (coronary artery disease)    s/p DES to RCA in 06/2021    Dry eyes    Endometrial polyp    Essential hypertension 08/25/2018   GERD (gastroesophageal reflux disease)    History of hiatal hernia    History of kidney stones    Hot flashes, menopausal 10/13/2011   Estradiol started    Hyperlipidemia    Jaundice as teenager   no problems since   Memory loss 09/21/2019     1/2 of feet numb all the time   Multiple sclerosis (HCC) dx 2001   Neuropathy    bilateral feet   Osteoarthritis    Sjogren's syndrome (HCC) dx oct 2021   sore muscvles, dry mouth and eyes   Stroke (HCC)    Vertigo    Vision abnormalities    Past Surgical History:  Procedure Laterality Date   ABDOMINAL HYSTERECTOMY  2002   partial   COLONOSCOPY  01/27/2022   2 day prep   colonscopy  2011   CORONARY STENT INTERVENTION N/A 07/18/2021   Procedure: CORONARY STENT INTERVENTION;  Surgeon: Corky Crafts, MD;  Location: [facility];  Service: Cardiovascular;  Laterality: N/A;   DILATATION & CURETTAGE/HYSTEROSCOPY WITH MYOSURE N/A 06/08/2020   Procedure: DILATATION & CURETTAGE/HYSTEROSCOPY/Polypectomy WITH MYOSURE;  Surgeon: Toy Baker, DO;  Location: [facility];  Service: Gynecology;  Laterality: N/A;   LEFT HEART CATH AND CORONARY ANGIOGRAPHY N/A 07/18/2021   Procedure: LEFT HEART CATH AND CORONARY ANGIOGRAPHY;  Surgeon: Corky Crafts, MD;  Location: [facility] CV [facility];  Service: Cardiovascular;  Laterality: N/A;   LUMBAR FUSION  2001   TOTAL HIP ARTHROPLASTY Right 01/17/2021   Procedure: TOTAL HIP ARTHROPLASTY ANTERIOR APPROACH;  Surgeon: Samson Frederic, MD;  Location: [facility];  Service: Orthopedics;  Laterality: Right;   UPPER GI ENDOSCOPY  yrs ago    Allergies  Allergen Reactions   Crestor [Rosuvastatin]     MYALGIA    Statins Other (See Comments)    Muscle pain; Tolerates Crestor     Outpatient Encounter Medications as of 03/26/2023  Medication Sig   acetaminophen (TYLENOL) 500 MG tablet Take 1,000 mg by mouth every 6  (six) hours as needed for mild pain or moderate pain.   amoxicillin (AMOXIL) 500 MG tablet Take 4 tablets by mouth 1-2 hrs before dental work   aspirin EC 81 MG tablet Take 81 mg by mouth daily. Swallow whole.   carvedilol (COREG) 6.25 MG tablet Take 1 tablet (6.25 mg total) by mouth 2 (two) times daily with a meal.   cholecalciferol (VITAMIN D) 25 MCG (1000 UNIT) tablet Take 2,000 Units by mouth daily.   clonazePAM (KLONOPIN) 0.5 MG tablet Take 1 tablet (0.5 mg total) by mouth at bedtime.   Coenzyme Q10 (COQ-10) 100 MG CAPS Take 100 mg by mouth daily.   diclofenac Sodium (VOLTAREN) 1 % GEL Apply 2 g topically 4 (four) times daily.   famotidine (PEPCID) 20 MG tablet Take 20 mg by mouth 2 (two) times daily as needed for heartburn or indigestion.   fexofenadine (ALLEGRA) 180 MG tablet Take 180 mg by mouth at bedtime.    Flaxseed, Linseed, (FLAXSEED OIL PO) Take 5-10 mLs by mouth 3 (three) times a week. Power   fluticasone (FLONASE) 50 MCG/ACT nasal spray Place 1 spray into both nostrils daily as needed for allergies or rhinitis.   hydrALAZINE (APRESOLINE) 25 MG tablet Take 1 tablet (25 mg total) by mouth in the morning and at bedtime.   Lidocaine 4 % PTCH Place 1 patch onto the skin daily as needed (mild pain). Remove & Discard patch within 12 hours or as directed by MD   losartan (COZAAR) 100 MG tablet TAKE 1 TABLET(100 MG) BY MOUTH DAILY   melatonin 5 MG TABS Take 5 mg by mouth at bedtime as needed (Sleep).   modafinil (PROVIGIL) 200 MG tablet One po qAM and one po qNoon   nitroGLYCERIN (NITROSTAT) 0.4 MG SL tablet Place 1 tablet (0.4 mg total) under the tongue every 5 (five) minutes as needed for chest pain.   Omega-3 Fatty Acids (OMEGA 3 500 PO) Take 500 mg by mouth daily.   pantoprazole (PROTONIX) 40 MG tablet TAKE 1 TABLET(40 MG) BY MOUTH DAILY   pravastatin (PRAVACHOL) 20 MG tablet Take 1 tablet (20 mg total) by mouth every evening.   No facility-administered encounter medications on file  as of 03/26/2023.    Review of Systems  Constitutional:  Negative for appetite change, chills, fatigue, fever and unexpected weight change.  Eyes:  Negative for pain, discharge, redness, itching and visual disturbance.  Respiratory:  Negative for cough, chest tightness, shortness of breath and wheezing.   Cardiovascular:  Negative for chest pain, palpitations and leg swelling.  Gastrointestinal:  Negative for abdominal distention, abdominal pain, blood in stool, constipation, diarrhea, nausea and vomiting.  Genitourinary:  Negative for difficulty urinating, dysuria, flank pain, frequency and urgency.  Musculoskeletal:  Positive for arthralgias, back pain and gait problem. Negative for joint swelling, myalgias, neck pain and neck stiffness.  Skin:  Negative for color change, pallor, rash and wound.  Neurological:  Negative for dizziness, syncope, speech difficulty, weakness, light-headedness, numbness and headaches.    Immunization History  Administered Date(s) Administered   Fluad Quad(high Dose 65+) 04/01/2019, 04/19/2021   Influenza Whole 05/14/2011   Influenza, High Dose Seasonal PF 04/30/2017, 05/15/2018, 05/18/2020   Influenza-Unspecified 03/04/2022   PFIZER(Purple Top)SARS-COV-2 Vaccination 08/27/2019, 09/21/2019, 03/07/2020, 11/16/2020   Pfizer Covid-19 Vaccine Bivalent Booster 73yrs & up 03/04/2022   Pneumococcal Conjugate-13 08/30/2018   Pneumococcal Polysaccharide-23 03/21/2009, 04/20/2020   Td (Adult), 2 Lf Tetanus Toxid, Preservative Free 12/04/2009   Zoster, Live 11/29/2009   Pertinent  Health Maintenance Due  Topic Date Due   DEXA SCAN  Never done   INFLUENZA VACCINE  02/19/2023      05/09/2022   10:17 AM 05/14/2022    5:29 PM 10/27/2022   10:08 AM 12/26/2022   10:07 AM 03/26/2023    1:53 PM  Fall Risk  Falls in the past year?   1 1 0  Was there an injury with Fall?   1 0   Fall Risk Category Calculator   2 1   (RETIRED) Patient Fall Risk Level Low fall risk Moderate  fall risk     Patient at Risk for Falls Due to   History of fall(s) History of fall(s)   Fall risk Follow up   Falls evaluation completed Falls evaluation completed    Functional Status Survey:    Vitals:   03/26/23 1354  BP: 130/80  Pulse: 62  Resp: 16  Temp: (!) 97.5 F (36.4 C)  SpO2: 98%  Weight: 181 lb (82.1 kg)  Height: 5\' 2"  (1.575 m)   Body mass index is 33.11 kg/m. Physical Exam Vitals reviewed.  Constitutional:      General: She is not in acute distress.    Appearance: Normal appearance. She is normal weight. She is not ill-appearing or diaphoretic.  HENT:     Head: Normocephalic.  Eyes:     General: No scleral icterus.       Right eye: No discharge.        Left eye: No discharge.     Conjunctiva/sclera: Conjunctivae normal.     Pupils: Pupils are equal, round, and reactive to light.  Neck:     Vascular: No carotid bruit.  Cardiovascular:     Rate and Rhythm: Normal rate and regular rhythm.     Pulses: Normal pulses.     Heart sounds: Normal heart sounds. No murmur heard.    No friction rub. No gallop.  Pulmonary:     Effort: Pulmonary effort is normal. No respiratory distress.     Breath sounds: Normal breath sounds. No wheezing, rhonchi or rales.  Chest:     Chest wall: No tenderness.  Abdominal:     General: Bowel sounds are normal. There is no distension.     Palpations: Abdomen is soft. There is no mass.     Tenderness: There is no abdominal tenderness. There is no right CVA tenderness, left CVA tenderness, guarding or rebound.  Musculoskeletal:        General: No swelling or tenderness. Normal range of motion.     Cervical back: Normal range of motion. No rigidity or tenderness.     Right lower leg: No edema.     Left lower leg: No edema.  Lymphadenopathy:     Cervical: No cervical adenopathy.  Skin:    General: Skin is warm and dry.     Coloration: Skin is not pale.     Findings: No bruising, erythema, lesion or rash.  Neurological:      Mental Status: She  is alert and oriented to person, place, and time.     Cranial Nerves: No cranial nerve deficit.     Sensory: No sensory deficit.     Motor: No weakness.     Coordination: Coordination normal.     Gait: Gait abnormal.  Psychiatric:        Mood and Affect: Mood normal.        Speech: Speech normal.        Behavior: Behavior normal.     Labs reviewed: Recent Labs    05/14/22 1720 05/14/22 1755 07/23/22 0958 10/22/22 1018  NA 139 138 137 139  K 3.9 3.9 4.2 4.6  CL 109 107 102 105  CO2 21*  --  28 25  GLUCOSE 77 78 80 93  BUN 19 20 17  26*  CREATININE 0.91 0.70 0.68 0.69  CALCIUM 9.4  --  9.0 9.2   Recent Labs    05/09/22 0014 05/14/22 1720 07/23/22 0958 10/22/22 1018  AST 23 21 18 15   ALT 19 14 17 17   ALKPHOS 104 93  --   --   BILITOT 0.5 0.5 0.4 0.3  PROT 6.7 6.3* 6.1 5.9*  ALBUMIN 4.1 4.0  --   --    Recent Labs    05/14/22 1720 05/14/22 1755 07/23/22 0958 10/22/22 1018  WBC 4.5  --  5.5 7.6  NEUTROABS 3.2  --  4,202 6,209  HGB 12.8 13.3 13.2 13.5  HCT 39.7 39.0 39.4 40.7  MCV 93.0  --  91.2 89.3  PLT 183  --  200 214   Lab Results  Component Value Date   TSH 2.41 10/22/2022   Lab Results  Component Value Date   HGBA1C 5.4 06/15/2021   Lab Results  Component Value Date   CHOL 137 10/22/2022   HDL 67 10/22/2022   LDLCALC 56 10/22/2022   LDLDIRECT 130.2 11/03/2011   TRIG 64 10/22/2022   CHOLHDL 2.0 10/22/2022    Significant Diagnostic Results in last 30 days:  No results found.  Assessment/Plan  Musculoskeletal back pain Lower and mid back pain  - Has cortisol injection  - follw up with Orthopedic as scheduled  - start on Cymbalta as below for pain SE discussed  - DULoxetine (CYMBALTA) 30 MG capsule; Take 1 capsule (30 mg total) by mouth daily.  Dispense: 90 capsule; Refill: 0  Family/ staff Communication: Reviewed plan of care with patient verbalized understanding   Labs/tests ordered: None   Next Appointment:  Return if symptoms worsen or fail to improve.   Caesar Bookman, NP

## 2023-04-01 ENCOUNTER — Ambulatory Visit (HOSPITAL_BASED_OUTPATIENT_CLINIC_OR_DEPARTMENT_OTHER): Payer: Medicare Other | Admitting: Physical Therapy

## 2023-04-04 DIAGNOSIS — Z23 Encounter for immunization: Secondary | ICD-10-CM | POA: Diagnosis not present

## 2023-04-08 ENCOUNTER — Ambulatory Visit (HOSPITAL_BASED_OUTPATIENT_CLINIC_OR_DEPARTMENT_OTHER): Payer: Medicare Other | Admitting: Physical Therapy

## 2023-04-08 DIAGNOSIS — R262 Difficulty in walking, not elsewhere classified: Secondary | ICD-10-CM | POA: Diagnosis not present

## 2023-04-08 DIAGNOSIS — M6281 Muscle weakness (generalized): Secondary | ICD-10-CM | POA: Diagnosis not present

## 2023-04-08 DIAGNOSIS — R252 Cramp and spasm: Secondary | ICD-10-CM

## 2023-04-08 DIAGNOSIS — M5459 Other low back pain: Secondary | ICD-10-CM | POA: Diagnosis not present

## 2023-04-08 NOTE — Therapy (Signed)
OUTPATIENT PHYSICAL THERAPY THORACOLUMBAR Treatment    Patient Name: Dawn [name] MRN: [id number] DOB:[birth year], 77 y.o., female Today's Date: 04/08/2023  END OF SESSION:       Past Medical History:  Diagnosis Date   Allergy    CAD (coronary artery disease)    s/p DES to RCA in 06/2021   Dry eyes    Endometrial polyp    Essential hypertension 08/25/2018   GERD (gastroesophageal reflux disease)    History of hiatal hernia    History of kidney stones    Hot flashes, menopausal 10/13/2011   Estradiol started    Hyperlipidemia    Jaundice as teenager   no problems since   Memory loss 09/21/2019     1/2 of feet numb all the time   Multiple sclerosis (HCC) dx 2001   Neuropathy    bilateral feet   Osteoarthritis    Sjogren's syndrome (HCC) dx oct 2021   sore muscvles, dry mouth and eyes   Stroke (HCC)    Vertigo    Vision abnormalities    Past Surgical History:  Procedure Laterality Date   ABDOMINAL HYSTERECTOMY  2002   partial   COLONOSCOPY  01/27/2022   2 day prep   colonscopy  2011   CORONARY STENT INTERVENTION N/A 07/18/2021   Procedure: CORONARY STENT INTERVENTION;  Surgeon: Corky Crafts, MD;  Location: [facility];  Service: Cardiovascular;  Laterality: N/A;   DILATATION & CURETTAGE/HYSTEROSCOPY WITH MYOSURE N/A 06/08/2020   Procedure: DILATATION & CURETTAGE/HYSTEROSCOPY/Polypectomy WITH MYOSURE;  Surgeon: Toy Baker, DO;  Location: [facility];  Service: Gynecology;  Laterality: N/A;   LEFT HEART CATH AND CORONARY ANGIOGRAPHY N/A 07/18/2021   Procedure: LEFT HEART CATH AND CORONARY ANGIOGRAPHY;  Surgeon: Corky Crafts, MD;  Location: [facility];  Service: Cardiovascular;  Laterality: N/A;   LUMBAR FUSION  2001   TOTAL HIP ARTHROPLASTY Right 01/17/2021   Procedure: TOTAL HIP ARTHROPLASTY ANTERIOR APPROACH;  Surgeon: Samson Frederic, MD;  Location: [facility];  Service: Orthopedics;  Laterality: Right;   UPPER  GI ENDOSCOPY  yrs ago   Patient Active Problem List   Diagnosis Date Noted   Positive colorectal cancer screening using Cologuard test 11/14/2021   Coronary artery disease    Statin intolerance 04/19/2021   Osteoarthritis of right hip 01/17/2021   Status post THR (total hip replacement) 01/17/2021   Chronic bilateral low back pain without sciatica 10/02/2020   Chronic pain syndrome 06/20/2020   Post laminectomy syndrome 06/20/2020   Sjogren's disease (HCC) 05/23/2020   Primary osteoarthritis of both hands 05/23/2020   Primary osteoarthritis of both feet 05/23/2020   Other secondary scoliosis, lumbar region 12/22/2019   Spondylosis without myelopathy or radiculopathy, lumbar region 12/22/2019   Cervical radiculopathy 10/18/2019   Numbness 09/21/2019   Bilateral carpal tunnel syndrome 09/21/2019   High risk medication use 09/21/2019   Memory loss 09/21/2019   Essential hypertension 08/25/2018   Abnormal SPEP 02/19/2018   Hand pain 02/20/2017   Multiple joint pain 02/18/2017   Disturbed cognition 06/25/2016   Sciatica, right side 10/30/2015   Chronic fatigue 08/04/2014   Gait disturbance 08/04/2014   Unspecified visual disturbance 01/31/2013   Transient vision disturbance 01/31/2013   Colon polyps 11/03/2011   POSTHERPETIC NEURALGIA 10/03/2009   DISPLCMT LUMBAR INTERVERT DISC W/O MYELOPATHY 09/05/2009   RENAL CALCULUS, RECURRENT 09/04/2009   Hyperlipidemia 08/31/2009   Multiple sclerosis (HCC) 08/31/2009   ALLERGIC RHINITIS 08/31/2009  Progress Note Reporting  Period 5/15/12024to 02/12/2023  See note below for Objective Data and Assessment of Progress/Goals.     PCP: Iantha Fallen NP  REFERRING PROVIDER: Dr Romero Belling   REFERRING DIAG: Low Back Pain   Rationale for Evaluation and Treatment: Rehabilitation  THERAPY DIAG:  No diagnosis found.  ONSET DATE: About 11 days ago her nerve abiliation wore off.   SUBJECTIVE:                                                                                                                                                                                            SUBJECTIVE STATEMENT: The patient reports her last PT session gave her 4-5 days of relief. She then had a progressive onset of low back pain. She reports today she is having difficulty ambulating. She is having significant back and hip pain.    PERTINENT HISTORY:  Right Hip replacement 2022, Low back Pain, MS, TIA October 2023; MI, November 2022; CAD, Sjorgens syndrome  PAIN:  Are you having pain? Yes: NPRS scale: 7/10 Pain location:umbar spine Pain description: aching  Aggravating factors: standing and walking  Relieving factors: rest    PRECAUTIONS: None  WEIGHT BEARING RESTRICTIONS: No  FALLS:  Has patient fallen in last 6 months? 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   LUMBAR ROM:   AROM eval 5/15   Flexion Limited 50 % with pain  Mild improvement but still about 50%  Improved pain but still limited  Extension No limit     Right lateral flexion     Left lateral flexion     Right rotation Limited 50% with pain     Left rotation Limited 50% with pain      (Blank rows = not tested)  LOWER EXTREMITY ROM:     Passive  Right eval Left eval Right  Right  7/24   Hip flexion 105 90 105 110 with pain at end range   Hip extension  10 degrees from neutral before manual today. Too neutral after stretching    Hip abduction      Hip adduction      Hip internal rotation painful Painful  Painful  Full with less pain   Hip external rotation   20 degrees with pain  45 with pain   Knee flexion      Knee extension      Ankle dorsiflexion      Ankle plantarflexion      Ankle inversion      Ankle eversion       (Blank rows = not tested)  LOWER EXTREMITY MMT:    MMT Right eval Left eval Right  3/26 Left  3/26 Right 5/15 Left 5/15 Right 7/24 Left 7/24  Hip flexion 13.1 12.3 15.1 26.1 18.2 26.2 11.3 19.1  Hip extension          Hip abduction 11.5 10.1 13.7 24.4 15.9 24 12.6 17.8  Hip adduction          Hip internal rotation          Hip external rotation          Knee flexion          Knee extension 14.0 13.3 17.5 33.3 20 38.2 19.4 18.3  Ankle dorsiflexion          Ankle plantarflexion          Ankle inversion          Ankle eversion           (Blank rows = not tested)  FUNCTIONAL TESTS:  5 times sit to stand: 28 sec  5/15 5 times sit to stand test 20 seconds 7/25 24 sec   3-minute walk test 150 feet without requirement of seated rest break 5/15  7/25 3 min walk test 200'   GAIT:  TODAY'S TREATMENT:                                                                                                                              DATE:  9/18   Trigger Point Dry-Needling  Treatment instructions: Expect mild to moderate muscle soreness. S/S of pneumothorax if dry needled over a lung field, and to seek immediate medical attention should they occur. Patient verbalized understanding of these instructions and education.  Patient Consent Given: Yes Education handout provided: Yes Muscles treated: Right gluteal 3 spots using 0.30 X75 needle left gluteal 2 spots using a 0.30 x 75 needle  Treatment response/outcome: Good twitch response;  immediate pain relief.  Manual: trigger point release to gluteal and lower lumbar spine. Trigger point release to hips and spine   LAQ red 2x10  Supine hip abduction 3x15 red  LTR x20  Supine march 3x10      09/04 rigger Point Dry-Needling  Treatment instructions: Expect mild to moderate muscle soreness. S/S of pneumothorax if dry needled over a lung field, and to seek immediate medical  attention should they occur. Patient verbalized understanding of these instructions and education.  Patient Consent Given: Yes Education handout provided: Yes Muscles treated: Right gluteal 3 spots using 0.30 X75 needle left gluteal 2 spots using a 0.30 x 75 needle 2 spots in each side of mid throacic t-8 through t-10  Treatment response/outcome: Good twitch response; immediate pain relief.    8/28 Trigger Point Dry-Needling  Treatment instructions: Expect mild to moderate muscle soreness. S/S of pneumothorax if dry needled over a lung field, and to seek immediate medical attention should they occur. Patient verbalized understanding of these instructions and education.  Patient Consent Given: Yes Education handout provided: Yes Muscles treated: Right gluteal 3 spots using 0.30 X75 needle left gluteal 2 spots using a 0.30 x 75 needle Using e-stim left and right at a .30x75 needle 6 pps intesity between 2-4   Treatment response/outcome: Good twitch response; immediate pain relief.  Trigger point release to gluteals and lumbar spine in prone.    Bilateral er 2x10 yellow  Bilateral horizontal abduction 2x10 yellow  Bilateral flexion  2x10 yellow band     8/21 Manual:  Trigger point release to gluteals and low back; side lying roller to hips and IT band; roller to anterior hip; LAD left hip   Supine march 3x10  SAQ 2lbs 3x10  LTR x20  Bridge 3x10   LAQ 2x15 1.5 lbs bilateral     Trigger Point Dry-Needling  Treatment instructions: Expect mild to moderate muscle soreness. S/S of  pneumothorax if dry needled over a lung field, and to seek immediate medical attention should they occur. Patient verbalized understanding of these instructions and education.  Patient Consent Given: Yes Education handout provided: Yes Muscles treated: Right gluteal 3 spots using 0.30 X75 needle left gluteal 2 spots using a 0.30 x 75 needle Treatment response/outcome: Good twitch response; immediate pain relief.   7/18 Manual:  Trigger point release to gluteals and low back; side lying roller to hips and IT band; roller to anterior hip   Seated clamshell 3x15 red  LAq 2x15 red each leg   Decompression:  90/90 on ball press x20  DKTC 20   LTR x20   Tests and measures.  Trigger Point Dry-Needling  Treatment instructions: Expect mild to moderate muscle soreness. S/S of pneumothorax if dry needled over a lung field, and to seek immediate medical attention should they occur. Patient verbalized understanding of these instructions and education.  Patient Consent Given: Yes Education handout provided: Yes Muscles treated: Right gluteal 3 spots using 0.30 X75 needle left gluteal 2 spots using a 0.30 x 75 needle Treatment response/outcome: Good twitch response; immediate pain relief.   PATIENT EDUCATION:  Education details: reviewed HEP, symptom management  Person educated: Patient Education method: Explanation, Demonstration, Tactile cues, Verbal cues, and Handouts Education comprehension: verbalized understanding, returned demonstration, verbal cues required, tactile cues required, and needs further education  HOME EXERCISE PROGRAM: Has previous HEP. Will review next visit  ASSESSMENT:  CLINICAL IMPRESSION: The patient had an improvement in pain with manual therapy.  She continues to have significant trigger points in her glutes and L/S. We performed needing to these ares. We reviewed exercises for home. She will truly yo get in the pool this week.   OBJECTIVE IMPAIRMENTS:  Abnormal gait, decreased activity tolerance, decreased balance, decreased endurance, decreased knowledge of use of DME, decreased mobility, difficulty walking, decreased ROM, decreased strength, increased muscle spasms, and pain.   ACTIVITY LIMITATIONS: carrying, lifting, bending, standing, squatting, sleeping,  stairs, transfers, and locomotion level  PARTICIPATION LIMITATIONS: meal prep, cleaning, laundry, driving, shopping, community activity, and yard work  PERSONAL FACTORS: Right Hip replacement 2022, Low back Pain, MS, TIA October 2023; MI, November 2022; CAD, Sjorgens syndrome are also affecting patient's functional outcome. MS  REHAB POTENTIAL: Good  CLINICAL DECISION MAKING: Evolving/moderate complexity declining mobility   EVALUATION COMPLEXITY: Moderate   GOALS: Goals reviewed with patient? No  SHORT TERM GOALS: Target date: 09/29/2022    Patient will increase gross bilateral lower extremity strength by 5 pounds Baseline: Goal status: declined since last eval. Will continue to monitor   2.  Patient will increase lumbar flexion by 10 degrees Baseline:  Goal status:  mild improvement but still painful   3.  Patient will be independent with basic after exercise program and stretching program Baseline:  Goal status: has basic HEP    LONG TERM GOALS: Target date: 09/29/2022     Patient will stand for greater than 30 minutes without increased pain Baseline:  Goal status: improving 5/15   2.  Patient will pick item up off the floor without increased low back pain Baseline:  Goal status:still having pain 5/15  3.  Patient will ambulate community distances without report of increased low back pain Baseline:  Goal status:limited by pain but maintaining 7/25  4.  Patient will report improved ability to sleep through the night secondary to back pain Baseline:  Goal status: continues to have pain at night 7/25    PLAN:  PT FREQUENCY: 1x/week  PT DURATION: 10  weeks   PLANNED INTERVENTIONS: Therapeutic exercises, Therapeutic activity, Neuromuscular re-education, Balance training, Gait training, Patient/Family education, Self Care, Joint mobilization, Stair training, DME instructions, Aquatic Therapy, Dry Needling, Spinal mobilization, Cryotherapy, Moist heat, Ionotophoresis 4mg /ml Dexamethasone, and Manual therapy.  PLAN FOR NEXT SESSION: Consider manual therapy to the back and hips, consider trigger point dry needling to back and hips.  Reviewed basic HEP.  Begin active strengthening of hips and core.  Begin gait training and functional mobility training.   Dessie Coma, PT 10/28/2022, 10:25 AM

## 2023-04-11 ENCOUNTER — Other Ambulatory Visit: Payer: Self-pay | Admitting: Family

## 2023-04-13 ENCOUNTER — Telehealth: Payer: Medicare Other | Admitting: *Deleted

## 2023-04-13 ENCOUNTER — Other Ambulatory Visit: Payer: Self-pay | Admitting: Family

## 2023-04-13 MED ORDER — PANTOPRAZOLE SODIUM 40 MG PO TBEC: 40.0000 mg | DELAYED_RELEASE_TABLET | Freq: Two times a day (BID) | ORAL | 0 refills | Status: DC

## 2023-04-13 NOTE — Telephone Encounter (Signed)
Patient notified and agreed.  

## 2023-04-13 NOTE — Telephone Encounter (Signed)
Patient called and stated that she has ran out of Pantoprazole. Stated that the pharmacy needs a new Rx because the one they have on file is for One a Day.   Patient stated that she and Dinah discussed her taking Pantoprazole 40mg  TWO tablets daily and this is what she has been taking. Patient is requesting a refill for TWO Tablets daily to be sent to pharmacy.   Please Advise.

## 2023-04-13 NOTE — Telephone Encounter (Signed)
Protonix 40 mg tablet twice daily send to pharmacy

## 2023-04-15 ENCOUNTER — Ambulatory Visit (HOSPITAL_BASED_OUTPATIENT_CLINIC_OR_DEPARTMENT_OTHER): Payer: Medicare Other | Admitting: Physical Therapy

## 2023-04-15 ENCOUNTER — Encounter (HOSPITAL_BASED_OUTPATIENT_CLINIC_OR_DEPARTMENT_OTHER): Payer: Self-pay | Admitting: Physical Therapy

## 2023-04-15 DIAGNOSIS — Z5181 Encounter for therapeutic drug level monitoring: Secondary | ICD-10-CM | POA: Diagnosis not present

## 2023-04-15 DIAGNOSIS — R252 Cramp and spasm: Secondary | ICD-10-CM

## 2023-04-15 DIAGNOSIS — M5459 Other low back pain: Secondary | ICD-10-CM | POA: Diagnosis not present

## 2023-04-15 DIAGNOSIS — M6281 Muscle weakness (generalized): Secondary | ICD-10-CM

## 2023-04-15 DIAGNOSIS — E785 Hyperlipidemia, unspecified: Secondary | ICD-10-CM | POA: Diagnosis not present

## 2023-04-15 DIAGNOSIS — R262 Difficulty in walking, not elsewhere classified: Secondary | ICD-10-CM

## 2023-04-15 DIAGNOSIS — I10 Essential (primary) hypertension: Secondary | ICD-10-CM | POA: Diagnosis not present

## 2023-04-15 NOTE — Therapy (Signed)
OUTPATIENT PHYSICAL THERAPY THORACOLUMBAR Treatment    Patient Name: Dawn [name] MRN: [id number] DOB:Dec 15, 1945, 77 y.o., female Today's Date: 04/15/2023  END OF SESSION:  PT End of Session - 04/15/23 1202     Visit Number 30    Number of Visits 34    Date for PT Re-Evaluation 04/23/23    Authorization Type progress note done at visit 24  next at 34    PT Start Time 1100    PT Stop Time 1143    PT Time Calculation (min) 43 min    Activity Tolerance Patient tolerated treatment well    Behavior During Therapy [facility] for tasks assessed/performed                 Past Medical History:  Diagnosis Date   Allergy    CAD (coronary artery disease)    s/p DES to RCA in 06/2021   Dry eyes    Endometrial polyp    Essential hypertension 08/25/2018   GERD (gastroesophageal reflux disease)    History of hiatal hernia    History of kidney stones    Hot flashes, menopausal 10/13/2011   Estradiol started    Hyperlipidemia    Jaundice as teenager   no problems since   Memory loss 09/21/2019     1/2 of feet numb all the time   Multiple sclerosis (HCC) dx 2001   Neuropathy    bilateral feet   Osteoarthritis    Sjogren's syndrome (HCC) dx oct 2021   sore muscvles, dry mouth and eyes   Stroke (HCC)    Vertigo    Vision abnormalities    Past Surgical History:  Procedure Laterality Date   ABDOMINAL HYSTERECTOMY  2002   partial   COLONOSCOPY  01/27/2022   2 day prep   colonscopy  2011   CORONARY STENT INTERVENTION N/A 07/18/2021   Procedure: CORONARY STENT INTERVENTION;  Surgeon: Corky Crafts, MD;  Location: [facility];  Service: Cardiovascular;  Laterality: N/A;   DILATATION & CURETTAGE/HYSTEROSCOPY WITH MYOSURE N/A 06/08/2020   Procedure: DILATATION & CURETTAGE/HYSTEROSCOPY/Polypectomy WITH MYOSURE;  Surgeon: Toy Baker, DO;  Location: [facility];  Service: Gynecology;  Laterality: N/A;   LEFT HEART CATH AND CORONARY ANGIOGRAPHY N/A  07/18/2021   Procedure: LEFT HEART CATH AND CORONARY ANGIOGRAPHY;  Surgeon: Corky Crafts, MD;  Location: [facility] CV [facility];  Service: Cardiovascular;  Laterality: N/A;   LUMBAR FUSION  2001   TOTAL HIP ARTHROPLASTY Right 01/17/2021   Procedure: TOTAL HIP ARTHROPLASTY ANTERIOR APPROACH;  Surgeon: Samson Frederic, MD;  Location: [facility];  Service: Orthopedics;  Laterality: Right;   UPPER GI ENDOSCOPY  yrs ago   Patient Active Problem List   Diagnosis Date Noted   Positive colorectal cancer screening using Cologuard test 11/14/2021   Coronary artery disease    Statin intolerance 04/19/2021   Osteoarthritis of right hip 01/17/2021   Status post THR (total hip replacement) 01/17/2021   Chronic bilateral low back pain without sciatica 10/02/2020   Chronic pain syndrome 06/20/2020   Post laminectomy syndrome 06/20/2020   Sjogren's disease (HCC) 05/23/2020   Primary osteoarthritis of both hands 05/23/2020   Primary osteoarthritis of both feet 05/23/2020   Other secondary scoliosis, lumbar region 12/22/2019   Spondylosis without myelopathy or radiculopathy, lumbar region 12/22/2019   Cervical radiculopathy 10/18/2019   Numbness 09/21/2019   Bilateral carpal tunnel syndrome 09/21/2019   High risk medication use 09/21/2019   Memory loss 09/21/2019  Essential hypertension 08/25/2018   Abnormal SPEP 02/19/2018   Hand pain 02/20/2017   Multiple joint pain 02/18/2017   Disturbed cognition 06/25/2016   Sciatica, right side 10/30/2015   Chronic fatigue 08/04/2014   Gait disturbance 08/04/2014   Unspecified visual disturbance 01/31/2013   Transient vision disturbance 01/31/2013   Colon polyps 11/03/2011   POSTHERPETIC NEURALGIA 10/03/2009   DISPLCMT LUMBAR INTERVERT DISC W/O MYELOPATHY 09/05/2009   RENAL CALCULUS, RECURRENT 09/04/2009   Hyperlipidemia 08/31/2009   Multiple sclerosis (HCC) 08/31/2009   ALLERGIC RHINITIS 08/31/2009  Progress Note Reporting Period 5/15/12024to  02/12/2023  See note below for Objective Data and Assessment of Progress/Goals.     PCP: Iantha Fallen NP  REFERRING PROVIDER: Dr Romero Belling   REFERRING DIAG: Low Back Pain   Rationale for Evaluation and Treatment: Rehabilitation  THERAPY DIAG:  Difficulty in walking, not elsewhere classified  Cramp and spasm  Other low back pain  Muscle weakness (generalized)  ONSET DATE: About 11 days ago her nerve abiliation wore off.   SUBJECTIVE:                                                                                                                                                                                           SUBJECTIVE STATEMENT: The patient continues to have severe pain.   PERTINENT HISTORY:  Right Hip replacement 2022, Low back Pain, MS, TIA October 2023; MI, November 2022; CAD, Sjorgens syndrome  PAIN:  Are you having pain? Yes: NPRS scale: 7/10 Pain location:umbar spine Pain description: aching  Aggravating factors: standing and walking  Relieving factors: rest    PRECAUTIONS: None  WEIGHT BEARING RESTRICTIONS: No  FALLS:  Has patient fallen in last 6 months? 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   LUMBAR ROM:   AROM eval 5/15   Flexion Limited 50 % with pain  Mild  improvement but still about 50%  Improved pain but still limited  Extension No limit  Right lateral flexion     Left lateral flexion     Right rotation Limited 50% with pain     Left rotation Limited 50% with pain      (Blank rows = not tested)  LOWER EXTREMITY ROM:     Passive  Right eval Left eval Right  Right  7/24  Hip flexion 105 90 105 110 with pain at end range   Hip extension   10 degrees from neutral before manual today. Too neutral after stretching    Hip abduction      Hip adduction      Hip internal rotation painful Painful  Painful  Full with less pain   Hip external rotation   20 degrees with pain  45 with pain   Knee flexion      Knee extension      Ankle dorsiflexion      Ankle plantarflexion      Ankle inversion      Ankle eversion       (Blank rows = not tested)  LOWER EXTREMITY MMT:    MMT Right eval Left eval Right  3/26 Left  3/26 Right 5/15 Left 5/15 Right 7/24 Left 7/24  Hip flexion 13.1 12.3 15.1 26.1 18.2 26.2 11.3 19.1  Hip extension          Hip abduction 11.5 10.1 13.7 24.4 15.9 24 12.6 17.8  Hip adduction          Hip internal rotation          Hip external rotation          Knee flexion          Knee extension 14.0 13.3 17.5 33.3 20 38.2 19.4 18.3  Ankle dorsiflexion          Ankle plantarflexion          Ankle inversion          Ankle eversion           (Blank rows = not tested)  FUNCTIONAL TESTS:  5 times sit to stand: 28 sec  5/15 5 times sit to stand test 20 seconds 7/25 24 sec   3-minute walk test 150 feet without requirement of seated rest break 5/15  7/25 3 min walk test 200'   GAIT:  TODAY'S TREATMENT:                                                                                                                              DATE:  09/25 Trigger Point Dry-Needling  Treatment instructions: Expect mild to moderate muscle soreness. S/S of pneumothorax if dry needled over a lung field, and to seek immediate  medical attention should they occur. Patient verbalized understanding of these instructions and education.  Patient Consent Given: Yes Education handout provided: Yes Muscles treated: Right gluteal 3 spots using 0.30 X75 needle left gluteal 2 spots using a 0.30 x 75 needle  Treatment response/outcome:  Good twitch response; immediate pain relief.  Manual: trigger point release to gluteal and lower lumbar spine. Trigger point release to hips and spine; LAD   Ball roll out fwd and lateral x10 5 sec hold each  Ball press down 2x15   9/18   Trigger Point Dry-Needling  Treatment instructions: Expect mild to moderate muscle soreness. S/S of pneumothorax if dry needled over a lung field, and to seek immediate medical attention should they occur. Patient verbalized understanding of these instructions and education.  Patient Consent Given: Yes Education handout provided: Yes Muscles treated: Right gluteal 3 spots using 0.30 X75 needle left gluteal 2 spots using a 0.30 x 75 needle  Treatment response/outcome: Good twitch response; immediate pain relief.  Manual: trigger point release to gluteal and lower lumbar spine. Trigger point release to hips and spine   LAQ red 2x10  Supine hip abduction 3x15 red  LTR x20  Supine march 3x10      09/04 rigger Point Dry-Needling  Treatment instructions: Expect mild to moderate muscle soreness. S/S of pneumothorax if dry needled over a lung field, and to seek immediate medical attention should they occur. Patient verbalized understanding of these instructions and education.  Patient Consent Given: Yes Education handout provided: Yes Muscles treated: Right gluteal 3 spots using 0.30 X75 needle left gluteal 2 spots using a 0.30 x 75 needle 2 spots in each side of mid throacic t-8 through t-10  Treatment response/outcome: Good twitch response; immediate pain relief.    8/28 Trigger Point Dry-Needling  Treatment instructions: Expect mild to  moderate muscle soreness. S/S of pneumothorax if dry needled over a lung field, and to seek immediate medical attention should they occur. Patient verbalized understanding of these instructions and education.  Patient Consent Given: Yes Education handout provided: Yes Muscles treated: Right gluteal 3 spots using 0.30 X75 needle left gluteal 2 spots using a 0.30 x 75 needle Using e-stim left and right at a .30x75 needle 6 pps intesity between 2-4   Treatment response/outcome: Good twitch response; immediate pain relief.  Trigger point release to gluteals and lumbar spine in prone.    Bilateral er 2x10 yellow  Bilateral horizontal abduction 2x10 yellow  Bilateral flexion  2x10 yellow band     8/21 Manual:  Trigger point release to gluteals and low back; side lying roller to hips and IT band; roller to anterior hip; LAD left hip   Supine march 3x10  SAQ 2lbs 3x10  LTR x20  Bridge 3x10   LAQ 2x15 1.5 lbs bilateral     Trigger Point Dry-Needling  Treatment instructions: Expect mild to moderate muscle soreness. S/S of pneumothorax if dry needled over a lung field, and to seek immediate medical attention should they occur. Patient verbalized understanding of these instructions and education.  Patient Consent Given: Yes Education handout provided: Yes Muscles treated: Right gluteal 3 spots using 0.30 X75 needle left gluteal 2 spots using a 0.30 x 75 needle Treatment response/outcome: Good twitch response; immediate pain relief.   7/18 Manual:  Trigger point release to gluteals and low back; side lying roller to hips and IT band; roller to anterior hip   Seated clamshell 3x15 red  LAq 2x15 red each leg   Decompression:  90/90 on ball press x20  DKTC 20   LTR x20   Tests and measures.  Trigger Point Dry-Needling  Treatment instructions: Expect mild to moderate muscle soreness. S/S of pneumothorax if dry needled over a lung field, and to seek immediate  medical attention  should they occur. Patient verbalized understanding of these instructions and education.  Patient Consent Given: Yes Education handout provided: Yes Muscles treated: Right gluteal 3 spots using 0.30 X75 needle left gluteal 2 spots using a 0.30 x 75 needle Treatment response/outcome: Good twitch response; immediate pain relief.   PATIENT EDUCATION:  Education details: reviewed HEP, symptom management  Person educated: Patient Education method: Explanation, Demonstration, Tactile cues, Verbal cues, and Handouts Education comprehension: verbalized understanding, returned demonstration, verbal cues required, tactile cues required, and needs further education  HOME EXERCISE PROGRAM: Has previous HEP. Will review next visit  ASSESSMENT:  CLINICAL IMPRESSION: Therapy continues to focus on manual therapy to reduce acute low back pain. She reports that she gets about 2-3 good days following therapy. Her back pain then increases. She has been working on maximizing her activity on the good days. She feels like she has been able to do a bit more. We will continue to work on pain control and exercises.    OBJECTIVE IMPAIRMENTS: Abnormal gait, decreased activity tolerance, decreased balance, decreased endurance, decreased knowledge of use of DME, decreased mobility, difficulty walking, decreased ROM, decreased strength, increased muscle spasms, and pain.   ACTIVITY LIMITATIONS: carrying, lifting, bending, standing, squatting, sleeping, stairs, transfers, and locomotion level  PARTICIPATION LIMITATIONS: meal prep, cleaning, laundry, driving, shopping, community activity, and yard work  PERSONAL FACTORS: Right Hip replacement 2022, Low back Pain, MS, TIA October 2023; MI, November 2022; CAD, Sjorgens syndrome are also affecting patient's functional outcome. MS  REHAB POTENTIAL: Good  CLINICAL DECISION MAKING: Evolving/moderate complexity declining mobility   EVALUATION COMPLEXITY:  Moderate   GOALS: Goals reviewed with patient? No  SHORT TERM GOALS: Target date: 09/29/2022    Patient will increase gross bilateral lower extremity strength by 5 pounds Baseline: Goal status: declined since last eval. Will continue to monitor   2.  Patient will increase lumbar flexion by 10 degrees Baseline:  Goal status:  mild improvement but still painful   3.  Patient will be independent with basic after exercise program and stretching program Baseline:  Goal status: has basic HEP    LONG TERM GOALS: Target date: 09/29/2022     Patient will stand for greater than 30 minutes without increased pain Baseline:  Goal status: improving 5/15   2.  Patient will pick item up off the floor without increased low back pain Baseline:  Goal status:still having pain 5/15  3.  Patient will ambulate community distances without report of increased low back pain Baseline:  Goal status:limited by pain but maintaining 7/25  4.  Patient will report improved ability to sleep through the night secondary to back pain Baseline:  Goal status: continues to have pain at night 7/25    PLAN:  PT FREQUENCY: 1x/week  PT DURATION: 10 weeks   PLANNED INTERVENTIONS: Therapeutic exercises, Therapeutic activity, Neuromuscular re-education, Balance training, Gait training, Patient/Family education, Self Care, Joint mobilization, Stair training, DME instructions, Aquatic Therapy, Dry Needling, Spinal mobilization, Cryotherapy, Moist heat, Ionotophoresis 4mg /ml Dexamethasone, and Manual therapy.  PLAN FOR NEXT SESSION: Consider manual therapy to the back and hips, consider trigger point dry needling to back and hips.  Reviewed basic HEP.  Begin active strengthening of hips and core.  Begin gait training and functional mobility training.   Dessie Coma, PT 10/28/2022, 12:04 PM

## 2023-04-16 ENCOUNTER — Telehealth (HOSPITAL_BASED_OUTPATIENT_CLINIC_OR_DEPARTMENT_OTHER): Payer: Self-pay

## 2023-04-16 DIAGNOSIS — E785 Hyperlipidemia, unspecified: Secondary | ICD-10-CM

## 2023-04-16 LAB — HEPATIC FUNCTION PANEL
ALT: 14 IU/L (ref 0–32)
AST: 15 IU/L (ref 0–40)
Albumin: 4.3 g/dL (ref 3.8–4.8)
Alkaline Phosphatase: 115 IU/L (ref 44–121)
Bilirubin Total: 0.3 mg/dL (ref 0.0–1.2)
Bilirubin, Direct: <0.1 mg/dL (ref 0.00–0.40)
Total Protein: 6.1 g/dL (ref 6.0–8.5)

## 2023-04-16 LAB — LIPID PANEL
Chol/HDL Ratio: 3.8 ratio (ref 0.0–4.4)
Cholesterol, Total: 211 mg/dL — ABNORMAL HIGH (ref 100–199)
HDL: 56 mg/dL (ref >39)
LDL Chol Calc (NIH): 133 mg/dL — ABNORMAL HIGH (ref 0–99)
Triglycerides: 124 mg/dL (ref 0–149)
VLDL Cholesterol Cal: 22 mg/dL (ref 5–40)

## 2023-04-16 MED ORDER — PRAVASTATIN SODIUM 40 MG PO TABS: 40.0000 mg | ORAL_TABLET | Freq: Every evening | ORAL | 3 refills | Status: DC

## 2023-04-16 NOTE — Telephone Encounter (Addendum)
Results called to patient who verbalizes understanding! Rx to pharmacy and labs mailed to patient.    ----- Message from [name] sent at 04/16/2023  8:38 AM EDT ----- Normal liver enzymes.  Cholesterol elevated.  LDL of 133 with goal being less than 70.  Recommend increasing pravastatin to 40 mg daily.  Repeat FLP/LFT in 3 months

## 2023-04-17 DIAGNOSIS — M533 Sacrococcygeal disorders, not elsewhere classified: Secondary | ICD-10-CM | POA: Diagnosis not present

## 2023-04-22 ENCOUNTER — Encounter (HOSPITAL_BASED_OUTPATIENT_CLINIC_OR_DEPARTMENT_OTHER): Payer: Self-pay | Admitting: Physical Therapy

## 2023-04-22 ENCOUNTER — Ambulatory Visit (HOSPITAL_BASED_OUTPATIENT_CLINIC_OR_DEPARTMENT_OTHER): Payer: Medicare Other | Attending: Physical Medicine and Rehabilitation | Admitting: Physical Therapy

## 2023-04-22 DIAGNOSIS — R262 Difficulty in walking, not elsewhere classified: Secondary | ICD-10-CM | POA: Diagnosis not present

## 2023-04-22 DIAGNOSIS — R252 Cramp and spasm: Secondary | ICD-10-CM | POA: Insufficient documentation

## 2023-04-22 DIAGNOSIS — M6281 Muscle weakness (generalized): Secondary | ICD-10-CM | POA: Diagnosis not present

## 2023-04-22 DIAGNOSIS — M5459 Other low back pain: Secondary | ICD-10-CM | POA: Diagnosis not present

## 2023-04-22 NOTE — Therapy (Signed)
OUTPATIENT PHYSICAL THERAPY THORACOLUMBAR Treatment    Patient Name: Dawn [name] MRN: [id number] DOB:Oct 31, 1945, 77 y.o., female Today's Date: 04/22/2023  END OF SESSION:  PT End of Session - 04/22/23 1410     Visit Number 31    Number of Visits 34    Date for PT Re-Evaluation 04/23/23    Authorization Type progress note done at visit 24  next at 34    PT Start Time 1027   Patient 15 min late   PT Stop Time 1100    PT Time Calculation (min) 33 min    Activity Tolerance Patient tolerated treatment well    Behavior During Therapy [facility] for tasks assessed/performed                 Past Medical History:  Diagnosis Date   Allergy    CAD (coronary artery disease)    s/p DES to RCA in 06/2021   Dry eyes    Endometrial polyp    Essential hypertension 08/25/2018   GERD (gastroesophageal reflux disease)    History of hiatal hernia    History of kidney stones    Hot flashes, menopausal 10/13/2011   Estradiol started    Hyperlipidemia    Jaundice as teenager   no problems since   Memory loss 09/21/2019     1/2 of feet numb all the time   Multiple sclerosis (HCC) dx 2001   Neuropathy    bilateral feet   Osteoarthritis    Sjogren's syndrome (HCC) dx oct 2021   sore muscvles, dry mouth and eyes   Stroke (HCC)    Vertigo    Vision abnormalities    Past Surgical History:  Procedure Laterality Date   ABDOMINAL HYSTERECTOMY  2002   partial   COLONOSCOPY  01/27/2022   2 day prep   colonscopy  2011   CORONARY STENT INTERVENTION N/A 07/18/2021   Procedure: CORONARY STENT INTERVENTION;  Surgeon: Corky Crafts, MD;  Location: [facility];  Service: Cardiovascular;  Laterality: N/A;   DILATATION & CURETTAGE/HYSTEROSCOPY WITH MYOSURE N/A 06/08/2020   Procedure: DILATATION & CURETTAGE/HYSTEROSCOPY/Polypectomy WITH MYOSURE;  Surgeon: Toy Baker, DO;  Location: [facility];  Service: Gynecology;  Laterality: N/A;   LEFT HEART CATH AND  CORONARY ANGIOGRAPHY N/A 07/18/2021   Procedure: LEFT HEART CATH AND CORONARY ANGIOGRAPHY;  Surgeon: Corky Crafts, MD;  Location: [facility] IN[facility] CV LAB;  Service: Cardiovascular;  Laterality: N/A;   LUMBAR FUSION  2001   TOTAL HIP ARTHROPLASTY Right 01/17/2021   Procedure: TOTAL HIP ARTHROPLASTY ANTERIOR APPROACH;  Surgeon: Samson Frederic, MD;  Location: [facility];  Service: Orthopedics;  Laterality: Right;   UPPER GI ENDOSCOPY  yrs ago   Patient Active Problem List   Diagnosis Date Noted   Positive colorectal cancer screening using Cologuard test 11/14/2021   Coronary artery disease    Statin intolerance 04/19/2021   Osteoarthritis of right hip 01/17/2021   Status post THR (total hip replacement) 01/17/2021   Chronic bilateral low back pain without sciatica 10/02/2020   Chronic pain syndrome 06/20/2020   Post laminectomy syndrome 06/20/2020   Sjogren's disease (HCC) 05/23/2020   Primary osteoarthritis of both hands 05/23/2020   Primary osteoarthritis of both feet 05/23/2020   Other secondary scoliosis, lumbar region 12/22/2019   Spondylosis without myelopathy or radiculopathy, lumbar region 12/22/2019   Cervical radiculopathy 10/18/2019   Numbness 09/21/2019   Bilateral carpal tunnel syndrome 09/21/2019   High risk medication use 09/21/2019  Memory loss 09/21/2019   Essential hypertension 08/25/2018   Abnormal SPEP 02/19/2018   Hand pain 02/20/2017   Multiple joint pain 02/18/2017   Disturbed cognition 06/25/2016   Sciatica, right side 10/30/2015   Chronic fatigue 08/04/2014   Gait disturbance 08/04/2014   Unspecified visual disturbance 01/31/2013   Transient vision disturbance 01/31/2013   Colon polyps 11/03/2011   POSTHERPETIC NEURALGIA 10/03/2009   DISPLCMT LUMBAR INTERVERT DISC W/O MYELOPATHY 09/05/2009   RENAL CALCULUS, RECURRENT 09/04/2009   Hyperlipidemia 08/31/2009   Multiple sclerosis (HCC) 08/31/2009   ALLERGIC RHINITIS 08/31/2009  Progress Note Reporting  Period 5/15/12024to 02/12/2023  See note below for Objective Data and Assessment of Progress/Goals.     PCP: Iantha Fallen NP  REFERRING PROVIDER: Dr Romero Belling   REFERRING DIAG: Low Back Pain   Rationale for Evaluation and Treatment: Rehabilitation  THERAPY DIAG:  Difficulty in walking, not elsewhere classified  Cramp and spasm  Other low back pain  Muscle weakness (generalized)  ONSET DATE: About 11 days ago her nerve abiliation wore off.   SUBJECTIVE:                                                                                                                                                                                           SUBJECTIVE STATEMENT: The patient had injections and feels better. She has been able to move more   PERTINENT HISTORY:  Right Hip replacement 2022, Low back Pain, MS, TIA October 2023; MI, November 2022; CAD, Sjorgens syndrome  PAIN:  Are you having pain? Yes: NPRS scale: 4/10 Pain location:umbar spine Pain description: aching  Aggravating factors: standing and walking  Relieving factors: rest    PRECAUTIONS: None  WEIGHT BEARING RESTRICTIONS: No  FALLS:  Has patient fallen in last 6 months? 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   LUMBAR ROM:   AROM  eval 5/15   Flexion Limited 50 % with pain  Mild improvement but still  about 50%  Improved pain but still limited  Extension No limit     Right lateral flexion     Left lateral flexion     Right rotation Limited 50% with pain     Left rotation Limited 50% with pain      (Blank rows = not tested)  LOWER EXTREMITY ROM:     Passive  Right eval Left eval Right  Right  7/24  Hip flexion 105 90 105 110 with pain at end range   Hip extension   10 degrees from neutral before manual today. Too neutral after stretching    Hip abduction      Hip adduction      Hip internal rotation painful Painful  Painful  Full with less pain   Hip external rotation   20 degrees with pain  45 with pain   Knee flexion      Knee extension      Ankle dorsiflexion      Ankle plantarflexion      Ankle inversion      Ankle eversion       (Blank rows = not tested)  LOWER EXTREMITY MMT:    MMT Right eval Left eval Right  3/26 Left  3/26 Right 5/15 Left 5/15 Right 7/24 Left 7/24  Hip flexion 13.1 12.3 15.1 26.1 18.2 26.2 11.3 19.1  Hip extension          Hip abduction 11.5 10.1 13.7 24.4 15.9 24 12.6 17.8  Hip adduction          Hip internal rotation          Hip external rotation          Knee flexion          Knee extension 14.0 13.3 17.5 33.3 20 38.2 19.4 18.3  Ankle dorsiflexion          Ankle plantarflexion          Ankle inversion          Ankle eversion           (Blank rows = not tested)  FUNCTIONAL TESTS:  5 times sit to stand: 28 sec  5/15 5 times sit to stand test 20 seconds 7/25 24 sec   3-minute walk test 150 feet without requirement of seated rest break 5/15  7/25 3 min walk test 200'   GAIT:  TODAY'S TREATMENT:                                                                                                                              DATE:   Trigger Point Dry-Needling  Treatment instructions: Expect mild to moderate muscle soreness. S/S of pneumothorax if dry needled  over a lung field, and to seek immediate medical attention should they occur. Patient verbalized understanding of these instructions and education.  Patient Consent Given: Yes Education handout provided: Yes Muscles treated: Right gluteal 3 spots using  0.30 X75 needle left gluteal 2 spots using a 0.30 x 75 needle Treatment response/outcome: Good twitch response; immediate pain relief.  Manual: trigger point release to gluteal and lower lumbar spine. Trigger point release to hips and spine; LAD to left lef   Seated: March red 2x15  Seated hip abdcution 2x15 red  Seated LAQ 2x15 each leg    09/25 Trigger Point Dry-Needling  Treatment instructions: Expect mild to moderate muscle soreness. S/S of pneumothorax if dry needled over a lung field, and to seek immediate medical attention should they occur. Patient verbalized understanding of these instructions and education.  Patient Consent Given: Yes Education handout provided: Yes Muscles treated: Right gluteal 3 spots using 0.30 X75 needle left gluteal 2 spots using a 0.30 x 75 needle  Treatment response/outcome: Good twitch response; immediate pain relief.  Manual: trigger point release to gluteal and lower lumbar spine. Trigger point release to hips and spine; LAD   Ball roll out fwd and lateral x10 5 sec hold each  Ball press down 2x15   9/18   Trigger Point Dry-Needling  Treatment instructions: Expect mild to moderate muscle soreness. S/S of pneumothorax if dry needled over a lung field, and to seek immediate medical attention should they occur. Patient verbalized understanding of these instructions and education.  Patient Consent Given: Yes Education handout provided: Yes Muscles treated: Right gluteal 3 spots using 0.30 X75 needle left gluteal 2 spots using a 0.30 x 75 needle  Treatment response/outcome: Good twitch response; immediate pain relief.  Manual: trigger point release to gluteal and lower lumbar spine. Trigger  point release to hips and spine   LAQ red 2x10  Supine hip abduction 3x15 red  LTR x20  Supine march 3x10      09/04 rigger Point Dry-Needling  Treatment instructions: Expect mild to moderate muscle soreness. S/S of pneumothorax if dry needled over a lung field, and to seek immediate medical attention should they occur. Patient verbalized understanding of these instructions and education.  Patient Consent Given: Yes Education handout provided: Yes Muscles treated: Right gluteal 3 spots using 0.30 X75 needle left gluteal 2 spots using a 0.30 x 75 needle 2 spots in each side of mid throacic t-8 through t-10  Treatment response/outcome: Good twitch response; immediate pain relief.    8/28 Trigger Point Dry-Needling  Treatment instructions: Expect mild to moderate muscle soreness. S/S of pneumothorax if dry needled over a lung field, and to seek immediate medical attention should they occur. Patient verbalized understanding of these instructions and education.  Patient Consent Given: Yes Education handout provided: Yes Muscles treated: Right gluteal 3 spots using 0.30 X75 needle left gluteal 2 spots using a 0.30 x 75 needle Using e-stim left and right at a .30x75 needle 6 pps intesity between 2-4   Treatment response/outcome: Good twitch response; immediate pain relief.  Trigger point release to gluteals and lumbar spine in prone.    Bilateral er 2x10 yellow  Bilateral horizontal abduction 2x10 yellow  Bilateral flexion  2x10 yellow band     8/21 Manual:  Trigger point release to gluteals and low back; side lying roller to hips and IT band; roller to anterior hip; LAD left hip   Supine march 3x10  SAQ 2lbs 3x10  LTR x20  Bridge 3x10   LAQ 2x15 1.5 lbs bilateral     Trigger Point Dry-Needling  Treatment instructions: Expect mild to moderate muscle soreness. S/S of pneumothorax if dry needled over a lung field, and to seek  immediate medical attention should they  occur. Patient verbalized understanding of these instructions and education.  Patient Consent Given: Yes Education handout provided: Yes Muscles treated: Right gluteal 3 spots using 0.30 X75 needle left gluteal 2 spots using a 0.30 x 75 needle Treatment response/outcome: Good twitch response; immediate pain relief.   7/18 Manual:  Trigger point release to gluteals and low back; side lying roller to hips and IT band; roller to anterior hip   Seated clamshell 3x15 red  LAq 2x15 red each leg   Decompression:  90/90 on ball press x20  DKTC 20   LTR x20   Tests and measures.  Trigger Point Dry-Needling  Treatment instructions: Expect mild to moderate muscle soreness. S/S of pneumothorax if dry needled over a lung field, and to seek immediate medical attention should they occur. Patient verbalized understanding of these instructions and education.  Patient Consent Given: Yes Education handout provided: Yes Muscles treated: Right gluteal 3 spots using 0.30 X75 needle left gluteal 2 spots using a 0.30 x 75 needle Treatment response/outcome: Good twitch response; immediate pain relief.   PATIENT EDUCATION:  Education details: reviewed HEP, symptom management  Person educated: Patient Education method: Explanation, Demonstration, Tactile cues, Verbal cues, and Handouts Education comprehension: verbalized understanding, returned demonstration, verbal cues required, tactile cues required, and needs further education  HOME EXERCISE PROGRAM: Has previous HEP. Will review next visit  ASSESSMENT:  CLINICAL IMPRESSION: The patient did better today. She feels the injections have helped. We were limited by time today. We were able to reviewed some of her seated exercises. Over the next few visits, if the pain allows, we will progress more into standing and functional exercises such as steps. Therapy will continue to progress as tolerated.    OBJECTIVE IMPAIRMENTS: Abnormal gait, decreased  activity tolerance, decreased balance, decreased endurance, decreased knowledge of use of DME, decreased mobility, difficulty walking, decreased ROM, decreased strength, increased muscle spasms, and pain.   ACTIVITY LIMITATIONS: carrying, lifting, bending, standing, squatting, sleeping, stairs, transfers, and locomotion level  PARTICIPATION LIMITATIONS: meal prep, cleaning, laundry, driving, shopping, community activity, and yard work  PERSONAL FACTORS: Right Hip replacement 2022, Low back Pain, MS, TIA October 2023; MI, November 2022; CAD, Sjorgens syndrome are also affecting patient's functional outcome. MS  REHAB POTENTIAL: Good  CLINICAL DECISION MAKING: Evolving/moderate complexity declining mobility   EVALUATION COMPLEXITY: Moderate   GOALS: Goals reviewed with patient? No  SHORT TERM GOALS: Target date: 09/29/2022    Patient will increase gross bilateral lower extremity strength by 5 pounds Baseline: Goal status: declined since last eval. Will continue to monitor   2.  Patient will increase lumbar flexion by 10 degrees Baseline:  Goal status:  mild improvement but still painful   3.  Patient will be independent with basic after exercise program and stretching program Baseline:  Goal status: has basic HEP    LONG TERM GOALS: Target date: 09/29/2022     Patient will stand for greater than 30 minutes without increased pain Baseline:  Goal status: improving 5/15   2.  Patient will pick item up off the floor without increased low back pain Baseline:  Goal status:still having pain 5/15  3.  Patient will ambulate community distances without report of increased low back pain Baseline:  Goal status:limited by pain but maintaining 7/25  4.  Patient will report improved ability to sleep through the night secondary to back pain Baseline:  Goal status: continues to have pain at night 7/25  PLAN:  PT FREQUENCY: 1x/week  PT DURATION: 10 weeks   PLANNED  INTERVENTIONS: Therapeutic exercises, Therapeutic activity, Neuromuscular re-education, Balance training, Gait training, Patient/Family education, Self Care, Joint mobilization, Stair training, DME instructions, Aquatic Therapy, Dry Needling, Spinal mobilization, Cryotherapy, Moist heat, Ionotophoresis 4mg /ml Dexamethasone, and Manual therapy.  PLAN FOR NEXT SESSION: Consider manual therapy to the back and hips, consider trigger point dry needling to back and hips.  Reviewed basic HEP.  Begin active strengthening of hips and core.  Begin gait training and functional mobility training.   Dessie Coma, PT 10/28/2022, 2:19 PM

## 2023-04-22 NOTE — Therapy (Incomplete)
OUTPATIENT PHYSICAL THERAPY THORACOLUMBAR Treatment    Patient Name: Dawn [name] MRN: [id number] DOB:[birth year], 77 y.o., female Today's Date: 04/22/2023  END OF SESSION:        Past Medical History:  Diagnosis Date   Allergy    CAD (coronary artery disease)    s/p DES to RCA in 06/2021   Dry eyes    Endometrial polyp    Essential hypertension 08/25/2018   GERD (gastroesophageal reflux disease)    History of hiatal hernia    History of kidney stones    Hot flashes, menopausal 10/13/2011   Estradiol started    Hyperlipidemia    Jaundice as teenager   no problems since   Memory loss 09/21/2019     1/2 of feet numb all the time   Multiple sclerosis (HCC) dx 2001   Neuropathy    bilateral feet   Osteoarthritis    Sjogren's syndrome (HCC) dx oct 2021   sore muscvles, dry mouth and eyes   Stroke (HCC)    Vertigo    Vision abnormalities    Past Surgical History:  Procedure Laterality Date   ABDOMINAL HYSTERECTOMY  2002   partial   COLONOSCOPY  01/27/2022   2 day prep   colonscopy  2011   CORONARY STENT INTERVENTION N/A 07/18/2021   Procedure: CORONARY STENT INTERVENTION;  Surgeon: Corky Crafts, MD;  Location: [facility];  Service: Cardiovascular;  Laterality: N/A;   DILATATION & CURETTAGE/HYSTEROSCOPY WITH MYOSURE N/A 06/08/2020   Procedure: DILATATION & CURETTAGE/HYSTEROSCOPY/Polypectomy WITH MYOSURE;  Surgeon: Toy Baker, DO;  Location: [facility];  Service: Gynecology;  Laterality: N/A;   LEFT HEART CATH AND CORONARY ANGIOGRAPHY N/A 07/18/2021   Procedure: LEFT HEART CATH AND CORONARY ANGIOGRAPHY;  Surgeon: Corky Crafts, MD;  Location: [facility] CV LAB;  Service: Cardiovascular;  Laterality: N/A;   LUMBAR FUSION  2001   TOTAL HIP ARTHROPLASTY Right 01/17/2021   Procedure: TOTAL HIP ARTHROPLASTY ANTERIOR APPROACH;  Surgeon: Samson Frederic, MD;  Location: [facility];  Service: Orthopedics;  Laterality: Right;    UPPER GI ENDOSCOPY  yrs ago   Patient Active Problem List   Diagnosis Date Noted   Positive colorectal cancer screening using Cologuard test 11/14/2021   Coronary artery disease    Statin intolerance 04/19/2021   Osteoarthritis of right hip 01/17/2021   Status post THR (total hip replacement) 01/17/2021   Chronic bilateral low back pain without sciatica 10/02/2020   Chronic pain syndrome 06/20/2020   Post laminectomy syndrome 06/20/2020   Sjogren's disease (HCC) 05/23/2020   Primary osteoarthritis of both hands 05/23/2020   Primary osteoarthritis of both feet 05/23/2020   Other secondary scoliosis, lumbar region 12/22/2019   Spondylosis without myelopathy or radiculopathy, lumbar region 12/22/2019   Cervical radiculopathy 10/18/2019   Numbness 09/21/2019   Bilateral carpal tunnel syndrome 09/21/2019   High risk medication use 09/21/2019   Memory loss 09/21/2019   Essential hypertension 08/25/2018   Abnormal SPEP 02/19/2018   Hand pain 02/20/2017   Multiple joint pain 02/18/2017   Disturbed cognition 06/25/2016   Sciatica, right side 10/30/2015   Chronic fatigue 08/04/2014   Gait disturbance 08/04/2014   Unspecified visual disturbance 01/31/2013   Transient vision disturbance 01/31/2013   Colon polyps 11/03/2011   POSTHERPETIC NEURALGIA 10/03/2009   DISPLCMT LUMBAR INTERVERT DISC W/O MYELOPATHY 09/05/2009   RENAL CALCULUS, RECURRENT 09/04/2009   Hyperlipidemia 08/31/2009   Multiple sclerosis (HCC) 08/31/2009   ALLERGIC RHINITIS 08/31/2009  Progress Note  Reporting Period 5/15/12024to 02/12/2023  See note below for Objective Data and Assessment of Progress/Goals.     PCP: Iantha Fallen NP  REFERRING PROVIDER: Dr Romero Belling   REFERRING DIAG: Low Back Pain   Rationale for Evaluation and Treatment: Rehabilitation  THERAPY DIAG:  No diagnosis found.  ONSET DATE: About 11 days ago her nerve abiliation wore off.   SUBJECTIVE:                                                                                                                                                                                            SUBJECTIVE STATEMENT: The patient continues to have severe pain.   PERTINENT HISTORY:  Right Hip replacement 2022, Low back Pain, MS, TIA October 2023; MI, November 2022; CAD, Sjorgens syndrome  PAIN:  Are you having pain? Yes: NPRS scale: 7/10 Pain location:umbar spine Pain description: aching  Aggravating factors: standing and walking  Relieving factors: rest    PRECAUTIONS: None  WEIGHT BEARING RESTRICTIONS: No  FALLS:  Has patient fallen in last 6 months? 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   LUMBAR ROM:   AROM eval 5/15   Flexion Limited 50 % with pain  Mild improvement but still about 50%  Improved pain but still limited  Extension No limit     Right lateral flexion     Left lateral flexion     Right rotation Limited 50% with pain     Left rotation Limited 50% with pain      (Blank rows = not tested)  LOWER EXTREMITY ROM:     Passive  Right eval Left eval Right  Right  7/24  Hip flexion 105 90 105 110 with pain at end range   Hip extension   10 degrees from neutral before manual today. Too neutral after stretching    Hip abduction      Hip  adduction      Hip internal  rotation painful Painful  Painful  Full with less pain   Hip external rotation   20 degrees with pain  45 with pain   Knee flexion      Knee extension      Ankle dorsiflexion      Ankle plantarflexion      Ankle inversion      Ankle eversion       (Blank rows = not tested)  LOWER EXTREMITY MMT:    MMT Right eval Left eval Right  3/26 Left  3/26 Right 5/15 Left 5/15 Right 7/24 Left 7/24  Hip flexion 13.1 12.3 15.1 26.1 18.2 26.2 11.3 19.1  Hip extension          Hip abduction 11.5 10.1 13.7 24.4 15.9 24 12.6 17.8  Hip adduction          Hip internal rotation          Hip external rotation          Knee flexion          Knee extension 14.0 13.3 17.5 33.3 20 38.2 19.4 18.3  Ankle dorsiflexion          Ankle plantarflexion          Ankle inversion          Ankle eversion           (Blank rows = not tested)  FUNCTIONAL TESTS:  5 times sit to stand: 28 sec  5/15 5 times sit to stand test 20 seconds 7/25 24 sec   3-minute walk test 150 feet without requirement of seated rest break 5/15  7/25 3 min walk test 200'   GAIT:  TODAY'S TREATMENT:                                                                                                                              DATE:  09/25 Trigger Point Dry-Needling  Treatment instructions: Expect mild to moderate muscle soreness. S/S of pneumothorax if dry needled over a lung field, and to seek immediate medical attention should they occur. Patient verbalized understanding of these instructions and education.  Patient Consent Given: Yes Education handout provided: Yes Muscles treated: Right gluteal 3 spots using 0.30 X75 needle left gluteal 2 spots using a 0.30 x 75 needle  Treatment response/outcome: Good twitch response; immediate pain relief.  Manual: trigger point release to gluteal and lower lumbar spine. Trigger point release to hips and spine; LAD   Ball roll out fwd and lateral x10 5 sec  hold each  Ball press down 2x15   9/18   Trigger Point Dry-Needling  Treatment instructions: Expect mild to moderate muscle soreness. S/S of pneumothorax if dry needled over a lung field, and to seek immediate medical attention should they occur. Patient verbalized understanding of these instructions and education.  Patient Consent Given: Yes Education handout provided: Yes Muscles treated: Right gluteal 3 spots using 0.30 X75 needle left  gluteal 2 spots using a 0.30 x 75 needle  Treatment response/outcome: Good twitch response; immediate pain relief.  Manual: trigger point release to gluteal and lower lumbar spine. Trigger point release to hips and spine   LAQ red 2x10  Supine hip abduction 3x15 red  LTR x20  Supine march 3x10      09/04 rigger Point Dry-Needling  Treatment instructions: Expect mild to moderate muscle soreness. S/S of pneumothorax if dry needled over a lung field, and to seek immediate medical attention should they occur. Patient verbalized understanding of these instructions and education.  Patient Consent Given: Yes Education handout provided: Yes Muscles treated: Right gluteal 3 spots using 0.30 X75 needle left gluteal 2 spots using a 0.30 x 75 needle 2 spots in each side of mid throacic t-8 through t-10  Treatment response/outcome: Good twitch response; immediate pain relief.    8/28 Trigger Point Dry-Needling  Treatment instructions: Expect mild to moderate muscle soreness. S/S of pneumothorax if dry needled over a lung field, and to seek immediate medical attention should they occur. Patient verbalized understanding of these instructions and education.  Patient Consent Given: Yes Education handout provided: Yes Muscles treated: Right gluteal 3 spots using 0.30 X75 needle left gluteal 2 spots using a 0.30 x 75 needle Using e-stim left and right at a .30x75 needle 6 pps intesity between 2-4   Treatment response/outcome: Good twitch response;  immediate pain relief.  Trigger point release to gluteals and lumbar spine in prone.    Bilateral er 2x10 yellow  Bilateral horizontal abduction 2x10 yellow  Bilateral flexion  2x10 yellow band     8/21 Manual:  Trigger point release to gluteals and low back; side lying roller to hips and IT band; roller to anterior hip; LAD left hip   Supine march 3x10  SAQ 2lbs 3x10  LTR x20  Bridge 3x10   LAQ 2x15 1.5 lbs bilateral     Trigger Point Dry-Needling  Treatment instructions: Expect mild to moderate muscle soreness. S/S of pneumothorax if dry needled over a lung field, and to seek immediate medical attention should they occur. Patient verbalized understanding of these instructions and education.  Patient Consent Given: Yes Education handout provided: Yes Muscles treated: Right gluteal 3 spots using 0.30 X75 needle left gluteal 2 spots using a 0.30 x 75 needle Treatment response/outcome: Good twitch response; immediate pain relief.   7/18 Manual:  Trigger point release to gluteals and low back; side lying roller to hips and IT band; roller to anterior hip   Seated clamshell 3x15 red  LAq 2x15 red each leg   Decompression:  90/90 on ball press x20  DKTC 20   LTR x20   Tests and measures.  Trigger Point Dry-Needling  Treatment instructions: Expect mild to moderate muscle soreness. S/S of pneumothorax if dry needled over a lung field, and to seek immediate medical attention should they occur. Patient verbalized understanding of these instructions and education.  Patient Consent Given: Yes Education handout provided: Yes Muscles treated: Right gluteal 3 spots using 0.30 X75 needle left gluteal 2 spots using a 0.30 x 75 needle Treatment response/outcome: Good twitch response; immediate pain relief.   PATIENT EDUCATION:  Education details: reviewed HEP, symptom management  Person educated: Patient Education method: Explanation, Demonstration, Tactile cues, Verbal  cues, and Handouts Education comprehension: verbalized understanding, returned demonstration, verbal cues required, tactile cues required, and needs further education  HOME EXERCISE PROGRAM: Has previous HEP. Will review next visit  ASSESSMENT:  CLINICAL  IMPRESSION: Therapy continues to focus on manual therapy to reduce acute low back pain. She reports that she gets about 2-3 good days following therapy. Her back pain then increases. She has been working on maximizing her activity on the good days. She feels like she has been able to do a bit more. We will continue to work on pain control and exercises.    OBJECTIVE IMPAIRMENTS: Abnormal gait, decreased activity tolerance, decreased balance, decreased endurance, decreased knowledge of use of DME, decreased mobility, difficulty walking, decreased ROM, decreased strength, increased muscle spasms, and pain.   ACTIVITY LIMITATIONS: carrying, lifting, bending, standing, squatting, sleeping, stairs, transfers, and locomotion level  PARTICIPATION LIMITATIONS: meal prep, cleaning, laundry, driving, shopping, community activity, and yard work  PERSONAL FACTORS: Right Hip replacement 2022, Low back Pain, MS, TIA October 2023; MI, November 2022; CAD, Sjorgens syndrome are also affecting patient's functional outcome. MS  REHAB POTENTIAL: Good  CLINICAL DECISION MAKING: Evolving/moderate complexity declining mobility   EVALUATION COMPLEXITY: Moderate   GOALS: Goals reviewed with patient? No  SHORT TERM GOALS: Target date: 09/29/2022    Patient will increase gross bilateral lower extremity strength by 5 pounds Baseline: Goal status: declined since last eval. Will continue to monitor   2.  Patient will increase lumbar flexion by 10 degrees Baseline:  Goal status:  mild improvement but still painful   3.  Patient will be independent with basic after exercise program and stretching program Baseline:  Goal status: has basic HEP    LONG  TERM GOALS: Target date: 09/29/2022     Patient will stand for greater than 30 minutes without increased pain Baseline:  Goal status: improving 5/15   2.  Patient will pick item up off the floor without increased low back pain Baseline:  Goal status:still having pain 5/15  3.  Patient will ambulate community distances without report of increased low back pain Baseline:  Goal status:limited by pain but maintaining 7/25  4.  Patient will report improved ability to sleep through the night secondary to back pain Baseline:  Goal status: continues to have pain at night 7/25    PLAN:  PT FREQUENCY: 1x/week  PT DURATION: 10 weeks   PLANNED INTERVENTIONS: Therapeutic exercises, Therapeutic activity, Neuromuscular re-education, Balance training, Gait training, Patient/Family education, Self Care, Joint mobilization, Stair training, DME instructions, Aquatic Therapy, Dry Needling, Spinal mobilization, Cryotherapy, Moist heat, Ionotophoresis 4mg /ml Dexamethasone, and Manual therapy.  PLAN FOR NEXT SESSION: Consider manual therapy to the back and hips, consider trigger point dry needling to back and hips.  Reviewed basic HEP.  Begin active strengthening of hips and core.  Begin gait training and functional mobility training.   Dessie Coma, PT 10/28/2022, 11:02 AM

## 2023-04-23 ENCOUNTER — Encounter (HOSPITAL_BASED_OUTPATIENT_CLINIC_OR_DEPARTMENT_OTHER): Payer: Self-pay | Admitting: Physical Therapy

## 2023-04-27 ENCOUNTER — Ambulatory Visit (INDEPENDENT_AMBULATORY_CARE_PROVIDER_SITE_OTHER): Payer: Medicare Other | Admitting: Cardiovascular Disease

## 2023-04-27 ENCOUNTER — Encounter (HOSPITAL_BASED_OUTPATIENT_CLINIC_OR_DEPARTMENT_OTHER): Payer: Self-pay | Admitting: Cardiovascular Disease

## 2023-04-27 VITALS — BP 126/76 | HR 67 | Ht 62.0 in | Wt 177.6 lb

## 2023-04-27 DIAGNOSIS — G35 Multiple sclerosis: Secondary | ICD-10-CM

## 2023-04-27 DIAGNOSIS — E7800 Pure hypercholesterolemia, unspecified: Secondary | ICD-10-CM

## 2023-04-27 DIAGNOSIS — I2510 Atherosclerotic heart disease of native coronary artery without angina pectoris: Secondary | ICD-10-CM

## 2023-04-27 DIAGNOSIS — E785 Hyperlipidemia, unspecified: Secondary | ICD-10-CM | POA: Diagnosis not present

## 2023-04-27 DIAGNOSIS — Z5181 Encounter for therapeutic drug level monitoring: Secondary | ICD-10-CM

## 2023-04-27 DIAGNOSIS — I10 Essential (primary) hypertension: Secondary | ICD-10-CM | POA: Diagnosis not present

## 2023-04-27 MED ORDER — REPATHA SURECLICK 140 MG/ML ~~LOC~~ SOAJ: 140.0000 mg | SUBCUTANEOUS | 3 refills | Status: DC

## 2023-04-27 NOTE — Progress Notes (Deleted)
Cardiology Office Note:  .   Date:  04/27/2023  ID:  Dawn [name], DOB 11/17/1945, MRN [id number] PCP: Dawn Bookman, NP  [facility] Providers Cardiologist:  Dawn Fus, MD { Click to update primary MD,subspecialty MD or APP then REFRESH:1}   History of Present Illness: [name] Kitchen   Dawn [name] is a 77 y.o. female with CAD status post NSTEMI, CVA, hypertension, hyperlipidemia, multiple sclerosis, and Sjogren's here for follow-up.  She had a subacute stroke 04/2022.  Subsequent carotid Dopplers and ambulatory monitor were unremarkable.  She was admitted previously 05/2021 with hypertensive urgency.  Echo at the time revealed LVEF 60-65% with mild LVH and grade 1 diastolic dysfunction.  She had mildly calcified aortic valve but no aortic stenosis.  CTA 06/2021 revealed severe stenosis of the LAD.  She underwent left heart cath 06/2021 and PCI of the RCA.  She had otherwise nonobstructive disease.  She saw Dr. Wyline [name] 05/2022 and was doing well other than statin myalgias.  She was referred to pharmacy for consideration of PCSK9/Leqvio.  She followed up with Dawn Shields, NP 12/2022 and was referred to the prep program.  She continued to report myalgias and was instructed to hold her statin for 2 weeks.  She felt better and was subsequently switched to pravastatin.   ROS: ***  Studies Reviewed: .        *** Risk Assessment/Calculations:   {Does this patient have ATRIAL FIBRILLATION?[phone]} No BP recorded.  {Refresh Note OR Click here to enter BP  :1}***       Physical Exam:   VS:  There were no vitals taken for this visit.   Wt Readings from Last 3 Encounters:  03/26/23 181 lb (82.1 kg)  01/06/23 182 lb (82.6 kg)  12/26/22 182 lb 3.2 oz (82.6 kg)    GEN: Well nourished, well developed in no acute distress NECK: No JVD; No carotid bruits CARDIAC: ***RRR, no murmurs, rubs, gallops RESPIRATORY:  Clear to auscultation without rales, wheezing or rhonchi  ABDOMEN: Soft,  non-tender, non-distended EXTREMITIES:  No edema; No deformity   ASSESSMENT AND PLAN: .   ***    {Are you ordering a CV Procedure (e.g. stress test, cath, DCCV, TEE, etc)?   Press F2        :[id number]}  Dispo: ***  Signed, Chilton Si, MD

## 2023-04-27 NOTE — Patient Instructions (Addendum)
Medication Instructions:  STOP PRAVASTATIN   START REPATHA 140 MG EVERY 14 DAYS   *If you need a refill on your cardiac medications before your next appointment, please call your pharmacy*  Lab Work: FASTING LP/CMET IN 2-3 MONTHS   If you have labs (blood work) drawn today and your tests are completely normal, you will receive your results only by: MyChart Message (if you have MyChart) OR A paper copy in the mail If you have any lab test that is abnormal or we need to change your treatment, we will call you to review the results.  Testing/Procedures: NONE  Follow-Up: At [facility], you and your health needs are our priority.  As part of our continuing mission to provide you with exceptional heart care, we have created designated Provider Care Teams.  These Care Teams include your primary Cardiologist (physician) and Advanced Practice Providers (APPs -  Physician Assistants and Nurse Practitioners) who all work together to provide you with the care you need, when you need it.  We recommend signing up for the patient portal called "MyChart".  Sign up information is provided on this After Visit Summary.  MyChart is used to connect with patients for Virtual Visits (Telemedicine).  Patients are able to view lab/test results, encounter notes, upcoming appointments, etc.  Non-urgent messages can be sent to your provider as well.   To learn more about what you can do with MyChart, go to [email or web address].    Your next appointment:   12 month(s)  Provider:   Chilton Si, MD

## 2023-04-27 NOTE — Progress Notes (Signed)
Cardiology Office Note:  .    Date:  04/27/2023  ID:  [name], DOB January 01, 1946, MRN [id number] PCP: Caesar Bookman, NP  [facility] Providers Cardiologist:  Maisie Fus, MD     History of Present Illness: [name] Kitchen    Dawn [name] is a 77 y.o. female with CAD status post NSTEMI, CVA, hypertension, hyperlipidemia, multiple sclerosis, and Sjogren's, here for follow-up.  She had a subacute stroke 04/2022.  Subsequent carotid Dopplers and ambulatory monitor were unremarkable.  She was admitted previously 05/2021 with hypertensive urgency.  Echo at the time revealed LVEF 60-65% with mild LVH and grade 1 diastolic dysfunction.  She had mildly calcified aortic valve but no aortic stenosis.  CTA 06/2021 revealed severe stenosis of the LAD.  She underwent left heart cath 06/2021 and PCI of the RCA.  She had otherwise nonobstructive disease.  She saw Dr. Wyline Mood 05/2022 and was doing well other than statin myalgias.  She was referred to pharmacy for consideration of PCSK9/Leqvio.  She followed up with [name], NP 12/2022 and was referred to the PREP program.  She continued to report myalgias and was instructed to hold her statin for 2 weeks.  She felt better and was subsequently switched to pravastatin.   Today, she complains of a lot of chronic pain that is worsening, including a new strange right LE cramping pain that is not improving. She believes this may be due to her pravastatin. Previously on rosuvastatin her myalgias consisted more of hand and arm pain. Regarding her activity, she continues to participate in PT. Usually she also swims twice a week, completing labs without anginal symptoms. However, she has been unable to do so lately given her worsening myalgias. She also states that her diet has been "terrible" in the setting of her kitchen being remodeled. As a result she knows she has gained some weight. She plans to work on dietary modifications. In the office her blood pressure  is 126/76. She denies any palpitations, chest pain, shortness of breath, peripheral edema, lightheadedness, headaches, syncope, orthopnea, or PND.  ROS:  Please see the history of present illness. All other systems are reviewed and negative.  (+) Myalgias (+) RLE muscle cramping  Studies Reviewed: .        Risk Assessment/Calculations:             Physical Exam:    VS:  BP 126/76   Pulse 67   Ht 5\' 2"  (1.575 m)   Wt 177 lb 9.6 oz (80.6 kg)   SpO2 99%   BMI 32.48 kg/m  , BMI Body mass index is 32.48 kg/m. GENERAL:  Well appearing HEENT: Pupils equal round and reactive, fundi not visualized, oral mucosa unremarkable NECK:  No jugular venous distention, waveform within normal limits, carotid upstroke brisk and symmetric, no bruits, no thyromegaly LUNGS:  Clear to auscultation bilaterally HEART:  RRR.  PMI not displaced or sustained,S1 and S2 within normal limits, no S3, no S4, no clicks, no rubs, no murmurs ABD:  Flat, positive bowel sounds normal in frequency in pitch, no bruits, no rebound, no guarding, no midline pulsatile mass, no hepatomegaly, no splenomegaly EXT:  2 plus pulses throughout, no edema, no cyanosis no clubbing SKIN:  No rashes no nodules NEURO:  Cranial nerves II through XII grossly intact, motor grossly intact throughout PSYCH:  Cognitively intact, oriented to person place and time  Wt Readings from Last 3 Encounters:  04/27/23 177 lb 9.6 oz (80.6 kg)  03/26/23 181 lb (82.1 kg)  01/06/23 182 lb (82.6 kg)     ASSESSMENT AND PLAN: .    # CAD:  # Hyperlipidemia LDL elevated at 133 despite statin therapy. Patient reports increased chronic pain, possibly related to statin use. Statin intolerance noted with both rosuvastatin and pravastatin. -Discontinue pravastatin. -Initiate Repatha (PCSK9 inhibitor) -Check lipid panel and comprehensive metabolic panel in 2-3 months.   # Hypertension:  BP well-controlled on carvedilol, hydralazine, and losartan.  #  Chronic Pain Patient reports increased chronic pain, particularly in the leg, described as similar to a "Charlie horse". Pain management with steroid injections has been ineffective recently. -Discontinuation of statin may alleviate some of the muscle pain. -Continue physical therapy exercises. -Encourage resumption of swimming for pain management and cardiovascular health.  General Health Maintenance: -Encourage improvement in diet. -Continue current medications Aspirin, Carvedilol, Hydralazine, Losartan. -Follow-up in a year or sooner if needed.        Dispo:  FU with Bedelia Pong C. Duke Salvia, MD, [facility] in 1 year.  I,Mathew Stumpf,acting as a Neurosurgeon for Chilton Si, MD.,have documented all relevant documentation on the behalf of Chilton Si, MD,as directed by  Chilton Si, MD while in the presence of Chilton Si, MD.  I, Allycia Pitz C. Duke Salvia, MD have reviewed all documentation for this visit.  The documentation of the exam, diagnosis, procedures, and orders on 04/27/2023 are all accurate and complete.   Signed, Chilton Si, MD

## 2023-04-28 ENCOUNTER — Other Ambulatory Visit: Payer: Medicare Other

## 2023-04-28 DIAGNOSIS — I10 Essential (primary) hypertension: Secondary | ICD-10-CM

## 2023-04-28 DIAGNOSIS — E782 Mixed hyperlipidemia: Secondary | ICD-10-CM

## 2023-04-29 ENCOUNTER — Ambulatory Visit (HOSPITAL_BASED_OUTPATIENT_CLINIC_OR_DEPARTMENT_OTHER): Payer: Medicare Other | Admitting: Physical Therapy

## 2023-04-29 ENCOUNTER — Encounter (HOSPITAL_BASED_OUTPATIENT_CLINIC_OR_DEPARTMENT_OTHER): Payer: Self-pay | Admitting: Physical Therapy

## 2023-04-29 DIAGNOSIS — R252 Cramp and spasm: Secondary | ICD-10-CM

## 2023-04-29 DIAGNOSIS — M6281 Muscle weakness (generalized): Secondary | ICD-10-CM

## 2023-04-29 DIAGNOSIS — R262 Difficulty in walking, not elsewhere classified: Secondary | ICD-10-CM | POA: Diagnosis not present

## 2023-04-29 DIAGNOSIS — M5459 Other low back pain: Secondary | ICD-10-CM | POA: Diagnosis not present

## 2023-04-29 NOTE — Therapy (Signed)
OUTPATIENT PHYSICAL THERAPY THORACOLUMBAR Treatment    Patient Name: Dawn [name] MRN: [id number] DOB:Jul 12, 1946, 77 y.o., female Today's Date: 04/29/2023  END OF SESSION:  PT End of Session - 04/29/23 1221     Visit Number 32    Number of Visits 34    Date for PT Re-Evaluation 04/23/23    Authorization Type progress note done at visit 24  next at 34    PT Start Time 1015    PT Stop Time 1058    PT Time Calculation (min) 43 min    Activity Tolerance Patient tolerated treatment well    Behavior During Therapy [facility] for tasks assessed/performed                 Past Medical History:  Diagnosis Date   Allergy    CAD (coronary artery disease)    s/p DES to RCA in 06/2021   Dry eyes    Endometrial polyp    Essential hypertension 08/25/2018   GERD (gastroesophageal reflux disease)    History of hiatal hernia    History of kidney stones    Hot flashes, menopausal 10/13/2011   Estradiol started    Hyperlipidemia    Jaundice as teenager   no problems since   Memory loss 09/21/2019     1/2 of feet numb all the time   Multiple sclerosis (HCC) dx 2001   Neuropathy    bilateral feet   Osteoarthritis    Sjogren's syndrome (HCC) dx oct 2021   sore muscvles, dry mouth and eyes   Stroke (HCC)    Vertigo    Vision abnormalities    Past Surgical History:  Procedure Laterality Date   ABDOMINAL HYSTERECTOMY  2002   partial   COLONOSCOPY  01/27/2022   2 day prep   colonscopy  2011   CORONARY STENT INTERVENTION N/A 07/18/2021   Procedure: CORONARY STENT INTERVENTION;  Surgeon: Corky Crafts, MD;  Location: [facility];  Service: Cardiovascular;  Laterality: N/A;   DILATATION & CURETTAGE/HYSTEROSCOPY WITH MYOSURE N/A 06/08/2020   Procedure: DILATATION & CURETTAGE/HYSTEROSCOPY/Polypectomy WITH MYOSURE;  Surgeon: Toy Baker, DO;  Location: [facility];  Service: Gynecology;  Laterality: N/A;   LEFT HEART CATH AND CORONARY ANGIOGRAPHY N/A  07/18/2021   Procedure: LEFT HEART CATH AND CORONARY ANGIOGRAPHY;  Surgeon: Corky Crafts, MD;  Location: [facility];  Service: Cardiovascular;  Laterality: N/A;   LUMBAR FUSION  2001   TOTAL HIP ARTHROPLASTY Right 01/17/2021   Procedure: TOTAL HIP ARTHROPLASTY ANTERIOR APPROACH;  Surgeon: Samson Frederic, MD;  Location: [facility];  Service: Orthopedics;  Laterality: Right;   UPPER GI ENDOSCOPY  yrs ago   Patient Active Problem List   Diagnosis Date Noted   Positive colorectal cancer screening using Cologuard test 11/14/2021   Coronary artery disease    Statin intolerance 04/19/2021   Osteoarthritis of right hip 01/17/2021   Status post THR (total hip replacement) 01/17/2021   Chronic bilateral low back pain without sciatica 10/02/2020   Chronic pain syndrome 06/20/2020   Post laminectomy syndrome 06/20/2020   Sjogren's disease (HCC) 05/23/2020   Primary osteoarthritis of both hands 05/23/2020   Primary osteoarthritis of both feet 05/23/2020   Other secondary scoliosis, lumbar region 12/22/2019   Spondylosis without myelopathy or radiculopathy, lumbar region 12/22/2019   Cervical radiculopathy 10/18/2019   Numbness 09/21/2019   Bilateral carpal tunnel syndrome 09/21/2019   High risk medication use 09/21/2019   Memory loss 09/21/2019  Essential hypertension 08/25/2018   Abnormal SPEP 02/19/2018   Hand pain 02/20/2017   Multiple joint pain 02/18/2017   Disturbed cognition 06/25/2016   Sciatica, right side 10/30/2015   Chronic fatigue 08/04/2014   Gait disturbance 08/04/2014   Unspecified visual disturbance 01/31/2013   Transient vision disturbance 01/31/2013   Colon polyps 11/03/2011   POSTHERPETIC NEURALGIA 10/03/2009   DISPLCMT LUMBAR INTERVERT DISC W/O MYELOPATHY 09/05/2009   RENAL CALCULUS, RECURRENT 09/04/2009   Hyperlipidemia 08/31/2009   Multiple sclerosis (HCC) 08/31/2009   ALLERGIC RHINITIS 08/31/2009  Progress Note Reporting Period 5/15/12024to  02/12/2023  See note below for Objective Data and Assessment of Progress/Goals.     PCP: Iantha Fallen NP  REFERRING PROVIDER: Dr Romero Belling   REFERRING DIAG: Low Back Pain   Rationale for Evaluation and Treatment: Rehabilitation  THERAPY DIAG:  Difficulty in walking, not elsewhere classified  Cramp and spasm  Other low back pain  Muscle weakness (generalized)  ONSET DATE: About 11 days ago her nerve abiliation wore off.   SUBJECTIVE:                                                                                                                                                                                           SUBJECTIVE STATEMENT: The patient had injections and feels better. She has been able to move more   PERTINENT HISTORY:  Right Hip replacement 2022, Low back Pain, MS, TIA October 2023; MI, November 2022; CAD, Sjorgens syndrome  PAIN:  Are you having pain? Yes: NPRS scale: 4/10 Pain location:umbar spine Pain description: aching  Aggravating factors: standing and walking  Relieving factors: rest    PRECAUTIONS: None  WEIGHT BEARING RESTRICTIONS: No  FALLS:  Has patient fallen in last 6 months? 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   LUMBAR ROM:   AROM eval 5/15   Flexion  Limited 50 % with pain  Mild improvement but still about 50%  Improved pain  but still limited  Extension No limit     Right lateral flexion     Left lateral flexion     Right rotation Limited 50% with pain     Left rotation Limited 50% with pain      (Blank rows = not tested)  LOWER EXTREMITY ROM:     Passive  Right eval Left eval Right  Right  7/24  Hip flexion 105 90 105 110 with pain at end range   Hip extension   10 degrees from neutral before manual today. Too neutral after stretching    Hip abduction      Hip adduction      Hip internal rotation painful Painful  Painful  Full with less pain   Hip external rotation   20 degrees with pain  45 with pain   Knee flexion      Knee extension      Ankle dorsiflexion      Ankle plantarflexion      Ankle inversion      Ankle eversion       (Blank rows = not tested)  LOWER EXTREMITY MMT:    MMT Right eval Left eval Right  3/26 Left  3/26 Right 5/15 Left 5/15 Right 7/24 Left 7/24  Hip flexion 13.1 12.3 15.1 26.1 18.2 26.2 11.3 19.1  Hip extension          Hip abduction 11.5 10.1 13.7 24.4 15.9 24 12.6 17.8  Hip adduction          Hip internal rotation          Hip external rotation          Knee flexion          Knee extension 14.0 13.3 17.5 33.3 20 38.2 19.4 18.3  Ankle dorsiflexion          Ankle plantarflexion          Ankle inversion          Ankle eversion           (Blank rows = not tested)  FUNCTIONAL TESTS:  5 times sit to stand: 28 sec  5/15 5 times sit to stand test 20 seconds 7/25 24 sec   3-minute walk test 150 feet without requirement of seated rest break 5/15  7/25 3 min walk test 200'   GAIT:  TODAY'S TREATMENT:                                                                                                                              DATE:  10/9 Trigger Point Dry-Needling  Treatment instructions: Expect mild to moderate muscle soreness. S/S of pneumothorax if dry needled over a lung  field, and to seek immediate medical attention should they occur. Patient verbalized understanding of these instructions and education.  Patient Consent Given: Yes Education handout provided: Yes Muscles treated: Right gluteal 3 spots using 0.30 X75 needle left gluteal  2 spots using a 0.30 x 75 needle Treatment response/outcome: Good twitch response; immediate pain relief.  Manual: trigger point release to gluteal and lower lumbar spine. Trigger point release to hips and spine; LAD to left left. Trigger point release toperoneals on the right side.   Seated: March green 2x15  Seated hip abdcution 2x15 green  Seated LAQ 2x15 each leg green    10/2  Trigger Point Dry-Needling  Treatment instructions: Expect mild to moderate muscle soreness. S/S of pneumothorax if dry needled over a lung field, and to seek immediate medical attention should they occur. Patient verbalized understanding of these instructions and education.  Patient Consent Given: Yes Education handout provided: Yes Muscles treated: Right gluteal 3 spots using 0.30 X75 needle left gluteal 2 spots using a 0.30 x 75 needle Treatment response/outcome: Good twitch response; immediate pain relief.  Manual: trigger point release to gluteal and lower lumbar spine. Trigger point release to hips and spine; LAD to left lef   Seated: March red 2x15  Seated hip abdcution 2x15 red  Seated LAQ 2x15 each leg        PATIENT EDUCATION:  Education details: reviewed HEP, symptom management  Person educated: Patient Education method: Explanation, Demonstration, Tactile cues, Verbal cues, and Handouts Education comprehension: verbalized understanding, returned demonstration, verbal cues required, tactile cues required, and needs further education  HOME EXERCISE PROGRAM: Has previous HEP. Will review next visit  ASSESSMENT:  CLINICAL IMPRESSION: The patient had an area of pain and tightness in her peroneals. Therapy worked on the  area. She felt an improvement in pain after manual therapy. We continue to work on [employer] she reports several days of relief following. She plans on getting back into the pool soon.    OBJECTIVE IMPAIRMENTS: Abnormal gait, decreased activity tolerance, decreased balance, decreased endurance, decreased knowledge of use of DME, decreased mobility, difficulty walking, decreased ROM, decreased strength, increased muscle spasms, and pain.   ACTIVITY LIMITATIONS: carrying, lifting, bending, standing, squatting, sleeping, stairs, transfers, and locomotion level  PARTICIPATION LIMITATIONS: meal prep, cleaning, laundry, driving, shopping, community activity, and yard work  PERSONAL FACTORS: Right Hip replacement 2022, Low back Pain, MS, TIA October 2023; MI, November 2022; CAD, Sjorgens syndrome are also affecting patient's functional outcome. MS  REHAB POTENTIAL: Good  CLINICAL DECISION MAKING: Evolving/moderate complexity declining mobility   EVALUATION COMPLEXITY: Moderate   GOALS: Goals reviewed with patient? No  SHORT TERM GOALS: Target date: 09/29/2022    Patient will increase gross bilateral lower extremity strength by 5 pounds Baseline: Goal status: declined since last eval. Will continue to monitor   2.  Patient will increase lumbar flexion by 10 degrees Baseline:  Goal status:  mild improvement but still painful   3.  Patient will be independent with basic after exercise program and stretching program Baseline:  Goal status: has basic HEP    LONG TERM GOALS: Target date: 09/29/2022     Patient will stand for greater than 30 minutes without increased pain Baseline:  Goal status: improving 5/15   2.  Patient will pick item up off the floor without increased low back pain Baseline:  Goal status:still having pain 5/15  3.  Patient will ambulate community distances without report of increased low back pain Baseline:  Goal status:limited by pain but maintaining 7/25  4.   Patient will report improved ability to sleep through the night secondary to back pain Baseline:  Goal status: continues to have pain at night 7/25    PLAN:  PT FREQUENCY: 1x/week  PT DURATION: 10 weeks   PLANNED INTERVENTIONS: Therapeutic exercises, Therapeutic activity, Neuromuscular re-education, Balance training, Gait training, Patient/Family education, Self Care, Joint mobilization, Stair training, DME instructions, Aquatic Therapy, Dry Needling, Spinal mobilization, Cryotherapy, Moist heat, Ionotophoresis 4mg /ml Dexamethasone, and Manual therapy.  PLAN FOR NEXT SESSION: Consider manual therapy to the back and hips, consider trigger point dry needling to back and hips.  Reviewed basic HEP.  Begin active strengthening of hips and core.  Begin gait training and functional mobility training.   Dessie Coma, PT 10/28/2022, 12:26 PM

## 2023-05-01 ENCOUNTER — Telehealth (HOSPITAL_BASED_OUTPATIENT_CLINIC_OR_DEPARTMENT_OTHER): Payer: Self-pay | Admitting: Pharmacy Technician

## 2023-05-01 ENCOUNTER — Encounter: Payer: Medicare Other | Admitting: Family

## 2023-05-01 ENCOUNTER — Telehealth (HOSPITAL_BASED_OUTPATIENT_CLINIC_OR_DEPARTMENT_OTHER): Payer: Self-pay | Admitting: Cardiovascular Disease

## 2023-05-01 MED ORDER — REPATHA SURECLICK 140 MG/ML ~~LOC~~ SOAJ: 140.0000 mg | SUBCUTANEOUS | 3 refills | Status: DC

## 2023-05-01 NOTE — Telephone Encounter (Signed)
Pt c/o medication issue:  1. Name of Medication:   Evolocumab (REPATHA SURECLICK) 140 MG/ML SOAJ   2. How are you currently taking this medication (dosage and times per day)?   Not taking yet  3. Are you having a reaction (difficulty breathing--STAT)?     4. What is your medication issue?   Patient stated she was recently prescribed this medication and it is very expensive.  Patient stated her pharmacy told her they will be putting in a prior authorization for this medication. Patient wants to know next steps.

## 2023-05-01 NOTE — Telephone Encounter (Signed)
Pharmacy Patient Advocate Encounter  Received notification from [facility] that Prior Authorization for repatha has been APPROVED from 05/01/23 to 10/30/23   PA #/Case ID/Reference #: [id number]

## 2023-05-01 NOTE — Telephone Encounter (Signed)
Returned call to patient, explained to patient that we will take care of PA and let her know when to pick up from the pharmacy.

## 2023-05-01 NOTE — Telephone Encounter (Signed)
Pharmacy Patient Advocate Encounter   Received notification from Pt Calls Messages that prior authorization for repatha is required/requested.   Insurance verification completed.   The patient is insured through [facility] [facility] .   Per test claim: PA required; PA started via CoverMyMeds. KEY [id number] . Waiting for clinical questions to populate.

## 2023-05-01 NOTE — Progress Notes (Signed)
  This encounter was created in error - please disregard. No show 

## 2023-05-01 NOTE — Addendum Note (Signed)
Addended by: Marlene Lard on: 05/01/2023 04:31 PM   Modules accepted: Orders

## 2023-05-01 NOTE — Telephone Encounter (Signed)
Returned call to patient, advised PA approved and can coordinate pick up with pharmacy.

## 2023-05-01 NOTE — Telephone Encounter (Signed)
Pharmacy called office to speak with nurse, transferred from call center.   The prescription had disappeared out of their system. Resent prescription.

## 2023-05-04 ENCOUNTER — Ambulatory Visit (INDEPENDENT_AMBULATORY_CARE_PROVIDER_SITE_OTHER): Payer: Medicare Other | Admitting: Neurology

## 2023-05-04 ENCOUNTER — Encounter: Payer: Self-pay | Admitting: Neurology

## 2023-05-04 VITALS — BP 164/75 | HR 65 | Ht 62.0 in | Wt 181.0 lb

## 2023-05-04 DIAGNOSIS — M4306 Spondylolysis, lumbar region: Secondary | ICD-10-CM

## 2023-05-04 DIAGNOSIS — I639 Cerebral infarction, unspecified: Secondary | ICD-10-CM

## 2023-05-04 DIAGNOSIS — M3509 Sicca syndrome with other organ involvement: Secondary | ICD-10-CM | POA: Diagnosis not present

## 2023-05-04 DIAGNOSIS — I252 Old myocardial infarction: Secondary | ICD-10-CM

## 2023-05-04 DIAGNOSIS — Z79899 Other long term (current) drug therapy: Secondary | ICD-10-CM | POA: Diagnosis not present

## 2023-05-04 DIAGNOSIS — M7061 Trochanteric bursitis, right hip: Secondary | ICD-10-CM | POA: Diagnosis not present

## 2023-05-04 DIAGNOSIS — R269 Unspecified abnormalities of gait and mobility: Secondary | ICD-10-CM

## 2023-05-04 DIAGNOSIS — G35 Multiple sclerosis: Secondary | ICD-10-CM | POA: Diagnosis not present

## 2023-05-04 DIAGNOSIS — M7062 Trochanteric bursitis, left hip: Secondary | ICD-10-CM | POA: Diagnosis not present

## 2023-05-04 MED ORDER — TOLTERODINE TARTRATE 2 MG PO TABS: 2.0000 mg | ORAL_TABLET | Freq: Two times a day (BID) | ORAL | 5 refills | Status: DC

## 2023-05-04 MED ORDER — MODAFINIL 200 MG PO TABS: ORAL_TABLET | ORAL | 1 refills | Status: DC

## 2023-05-04 NOTE — Progress Notes (Signed)
[facility]  PATIENT: Dawn [name] DOB: Mar 10, 1946  REFERRING CLINICIAN: Henrine Screws HISTORY FROM: Patient  REASON FOR VISIT: MS   HISTORICAL  CHIEF COMPLAINT:  Chief Complaint  Patient presents with   Follow-up    Pt in room 10. Here for MS follow up. Pt reports increased memory loss and fatigue. Pt reports muscle pain. Pt was on statin has now stopped this per MD request. Pt reports 2 recent falls at home. No head injury. Pt blood pressure elevated x 2.     HISTORY OF PRESENT ILLNESS:  Dawn [name] is a 77 y.o. woman who was diagnosed with multiple sclerosis in 2001.      Update 05/04/2023 MS is stable.    She is off Leflunomide for Sjogren's and MS.   No new neurologic symptom or exacerbaton recently. Remains on ASA for CVA.  No new CVA/TIA symptm  Her gait is reduced.  She feels balance is poor and also has pain which affects gait.   She feels lightheaded.   Many med's ere changed by cardiology.     She uses a cane due to the right leg giving out at times.   She is noting numbness but less pain in her hands.  At night, she gets painful tingling in her legs.   She has [place] urgency with some incontinence.    She has had a couple falls due to lightheadedness       She is having more lower back and hip.  SHe reports having piriformis muscle injections by orthopedics recently that helped a lot . She still has trochanteric bursa pain.     The right leg is more painful than left and sometimes feels it will give way.  She had a nerve block/RFA for her back in 2023    She has had several injections by Dr. Maurice Small.  Initially they helped but less so more recently.    She stopped taking a statin and will be switched to Repatha.  She feels better off the statin.     Because of her cardiac history, she also saw cardiology.  She had a cardiac stent placed after presenting with chest pain and having a cardiac catheter one plus year ago.   She had been on  ASA/Plavix  x 1 year but was on just ASA at the time of the stroke She was already on carvedilol and dose was increased.  . She also has CHF.  They are changing her Crestor to Repatha.    She is on several BP med's:  carvedilol, hydralazone, losartan.        She feels her cognition is about the same as last visit.      She notes some memory difficulties and wrd finding issues/.     She denies anxiety or depression at this time.   She has fatigue and takes 1 Provigil every morning and sometimes another 1/2 pill in the afternoon.     Clonazepam helps insomnia some and spasticity at night as much as it used to.   It helps her fall asleep but then she wakes up 3 hours later.     Stroke History: She had a left medial parietal lobe stroke in September 2023.  The week before, she noted difficulty with reading and also had mild word finding difficulty.   Reading has returned to normal but she still notes mild word finding issues - though much better.  We checked a carotid ultrasoud last week  05/21/2022 and it was normal for age.   Many years ago, she had a left PICA cerebellar stroke and we had started ASA _______________________________________________ MS History:    In 1994, she had an episode of severe vertigo with gait ataxia. An MRI at that time was reportedly normal. The symptoms were felt to be due to allergies. About 7 years later she had vertigo, gait ataxia and diplopia. Also her left leg was giving out. Short after that she had a hysterectomy. Postoperatively, she was numb in both legs and she had a lumbar puncture which showed changes consistent with MS. Then, an MRI of the brain was performed showing changes consistent with MS. She was referred to Dr. Sandria Manly who her on Betaseron. Last year, she switched from Betaseron to Tecfidera, in the hope that she would be able to avoid shots as she was having severe skin reactions with knots.    However, she had a lot of difficulty tolerating Tecfidera and swithced  to [place] in early 2016 and to [place] late 2016.  Switched to Leflunomide for insurance reasons in 2019.    She stopped (age) 2023.   MRI brain 09/13/2018 shows multiple T2/flair hyperintense foci in the hemispheres.  The pattern is consistent with chronic demyelinating plaque associated with multiple sclerosis though some foci likely also represent chronic microvascular ischemic change.  None of the foci enhance or appear to be acute and there is no change compared to the 2017 MRI.      Remote left cerebellar infarction.      There is a normal enhancement pattern and no acute findings.  MRI brain 11/17/2019 showed Remote left PICA distribution CVA in the left cerebellar hemisphere.  This appears unchanged compared to the 2020 MRI.      Scattered T2/FLAIR hyperintense foci in the hemispheres.  Some foci are juxtacortical and periventricular consistent with multiple sclerosis though other foci are more nonspecific and could also be seen with chronic microvascular ischemic change.  None of the foci appear to be acute.  There are no new lesions compared to the 09/13/2018 MRI.  MRI cervical spine showed a normal spinal cord.     At C5-C6 there is a right paramedian disc protrusion causing mild right foraminal narrowing and mild spinal stenosis but no nerve root compression.   Minimal degenerative changes at C2-C3 and C3-C4 detailed above that do not lead to spinal stenosis or nerve root compression.    REVIEW OF SYSTEMS:  Constitutional: No fevers, chills, sweats, or change in appetite.   Reports fatigue Eyes: No visual changes, double vision, eye pain Ear, nose and throat: No hearing loss, ear pain, nasal congestion, sore throat Cardiovascular: No chest pain, palpitations Respiratory:  No shortness of breath at rest or with exertion.   No wheezes GastrointestinaI: No nausea, vomiting, diarrhea, abdominal pain, fecal incontinence Genitourinary:  see above. Musculoskeletal:  Pain in left greater than  right hip Integumentary: No rash, pruritus.    Neurological: as above Psychiatric: No depression at this time.  No anxiety Endocrine: No palpitations, diaphoresis, change in appetite, change in weigh or increased thirst Hematologic/Lymphatic:  No anemia, purpura, petechiae. Allergic/Immunologic: No itchy/runny eyes, nasal congestion, recent allergic reactions, rashes  ALLERGIES: Allergies  Allergen Reactions   Crestor [Rosuvastatin]     MYALGIA    Pravastatin    Statins Other (See Comments)    Muscle pain; Tolerates Crestor     HOME MEDICATIONS: Outpatient Medications Prior to Visit  Medication Sig Dispense Refill  acetaminophen (TYLENOL) 500 MG tablet Take 1,000 mg by mouth every 6 (six) hours as needed for mild pain or moderate pain.     amoxicillin (AMOXIL) 500 MG tablet Take 4 tablets by mouth 1-2 hrs before dental work 20 tablet 2   aspirin EC 81 MG tablet Take 81 mg by mouth daily. Swallow whole.     carvedilol (COREG) 6.25 MG tablet Take 1 tablet (6.25 mg total) by mouth 2 (two) times daily with a meal. 180 tablet 3   cholecalciferol (VITAMIN D) 25 MCG (1000 UNIT) tablet Take 2,000 Units by mouth daily.     clonazePAM (KLONOPIN) 0.5 MG tablet Take 1 tablet (0.5 mg total) by mouth at bedtime. 90 tablet 1   Coenzyme Q10 (COQ-10) 100 MG CAPS Take 100 mg by mouth daily.     diclofenac Sodium (VOLTAREN) 1 % GEL Apply 2 g topically 4 (four) times daily. 50 g 1   famotidine (PEPCID) 20 MG tablet Take 20 mg by mouth 2 (two) times daily as needed for heartburn or indigestion.     fexofenadine (ALLEGRA) 180 MG tablet Take 180 mg by mouth at bedtime.      Flaxseed, Linseed, (FLAXSEED OIL PO) Take 5-10 mLs by mouth 3 (three) times a week. Power     fluticasone (FLONASE) 50 MCG/ACT nasal spray Place 1 spray into both nostrils daily as needed for allergies or rhinitis.     hydrALAZINE (APRESOLINE) 25 MG tablet Take 1 tablet (25 mg total) by mouth in the morning and at bedtime. 180 tablet 3    Lidocaine 4 % PTCH Place 1 patch onto the skin daily as needed (mild pain). Remove & Discard patch within 12 hours or as directed by MD     losartan (COZAAR) 100 MG tablet TAKE 1 TABLET(100 MG) BY MOUTH DAILY 90 tablet 3   melatonin 5 MG TABS Take 5 mg by mouth at bedtime as needed (Sleep).     modafinil (PROVIGIL) 200 MG tablet One po qAM and one po qNoon 180 tablet 1   nitroGLYCERIN (NITROSTAT) 0.4 MG SL tablet Place 1 tablet (0.4 mg total) under the tongue every 5 (five) minutes as needed for chest pain. 30 tablet 0   Omega-3 Fatty Acids (OMEGA 3 500 PO) Take 500 mg by mouth daily.     pantoprazole (PROTONIX) 40 MG tablet Take 1 tablet (40 mg total) by mouth 2 (two) times daily. 180 tablet 0   DULoxetine (CYMBALTA) 30 MG capsule Take 1 capsule (30 mg total) by mouth daily. (Patient not taking: Reported on 04/27/2023) 90 capsule 0   Evolocumab (REPATHA SURECLICK) 140 MG/ML SOAJ Inject 140 mg into the skin every 14 (fourteen) days. (Patient not taking: Reported on 05/04/2023) 6 mL 3   No facility-administered medications prior to visit.    PAST MEDICAL HISTORY: Past Medical History:  Diagnosis Date   Allergy    CAD (coronary artery disease)    s/p DES to RCA in 06/2021   Dry eyes    Endometrial polyp    Essential hypertension 08/25/2018   GERD (gastroesophageal reflux disease)    History of hiatal hernia    History of kidney stones    Hot flashes, menopausal 10/13/2011   Estradiol started    Hyperlipidemia    Jaundice as teenager   no problems since   Memory loss 09/21/2019     1/2 of feet numb all the time   Multiple sclerosis (HCC) dx 2001   Neuropathy  bilateral feet   Osteoarthritis    Sjogren's syndrome (HCC) dx oct 2021   sore muscvles, dry mouth and eyes   Stroke (HCC)    Vertigo    Vision abnormalities     PAST SURGICAL HISTORY: Past Surgical History:  Procedure Laterality Date   ABDOMINAL HYSTERECTOMY  2002   partial   COLONOSCOPY  01/27/2022   2 day  prep   colonscopy  2011   CORONARY STENT INTERVENTION N/A 07/18/2021   Procedure: CORONARY STENT INTERVENTION;  Surgeon: Corky Crafts, MD;  Location: [facility] INVASIVE CV [facility];  Service: Cardiovascular;  Laterality: N/A;   DILATATION & CURETTAGE/HYSTEROSCOPY WITH MYOSURE N/A 06/08/2020   Procedure: DILATATION & CURETTAGE/HYSTEROSCOPY/Polypectomy WITH MYOSURE;  Surgeon: Toy Baker, DO;  Location: [facility];  Service: Gynecology;  Laterality: N/A;   LEFT HEART CATH AND CORONARY ANGIOGRAPHY N/A 07/18/2021   Procedure: LEFT HEART CATH AND CORONARY ANGIOGRAPHY;  Surgeon: Corky Crafts, MD;  Location[facility] INVASIVE CV LAB;  Service: Cardiovascular;  Laterality: N/A;   LUMBAR FUSION  2001   TOTAL HIP ARTHROPLASTY Right 01/17/2021   Procedure: TOTAL HIP ARTHROPLASTY ANTERIOR APPROACH;  Surgeon: Samson Frederic, MD;  Location: [facility];  Service: Orthopedics;  Laterality: Right;   UPPER GI ENDOSCOPY  yrs ago    FAMILY HISTORY: Family History  Problem Relation Age of Onset   Heart failure Mother    Heart failure Father    Arthritis Other    Hypertension Other    Colon cancer Neg Hx    Colon polyps Neg Hx    Esophageal cancer Neg Hx    Stomach cancer Neg Hx    Rectal cancer Neg Hx     SOCIAL HISTORY:  Social History   Socioeconomic History   Marital status: Widowed    Spouse name: Not on file   Number of children: Not on file   Years of education: Not on file   Highest education level: Not on file  Occupational History   Occupation: retired  Tobacco Use   Smoking status: Former    Current packs/day: 0.00    Average packs/day: 0.5 packs/day for 15.0 years (7.5 ttl pk-yrs)    Types: Cigarettes    Start date: 08/04/1968    Quit date: 08/05/1983    Years since quitting: 39.7   Smokeless tobacco: Never  Vaping Use   Vaping status: Never Used  Substance and Sexual Activity   Alcohol use: Yes    Comment: rarely, maybe yearly drink   Drug use: Never    Sexual activity: Not Currently    Birth control/protection: Post-menopausal  Other Topics Concern   Not on file  Social History Narrative   Regular Exercise-no   Widowed   Retired   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     Tobacco use, amount per day now: former   Past tobacco use, amount per day: 1/2-1 packet   How many years did you use tobacco: 12 years   Alcohol use (drinks per week): 0   Diet: mediterranean based   Do you drink/eat things with caffeine: yes   Marital status:        widow  What year were you married? 1987   Do you live in a house, apartment, assisted living, condo, trailer, etc.? house   Is it one or more stories? 1 story   How many persons live in your home? 1   Do you have pets in your home?( please list) no   Current or past profession: Therapist, music    Do you exercise?          yes                        Type and how often? Tai chi and pt therapy, stretches etc....   Do you have a living will? yes   Do you have a DNR form?      no                             If not, do you want to discuss one? yes   Do you have signed POA/HPOA forms?      no                  If so, please bring to you appointment   Social Determinants of Health   Financial Resource Strain: Not on file  Food Insecurity: No Food Insecurity (01/07/2022)   Hunger Vital Sign    Worried About Running Out of Food in the Last Year: Never true    Ran Out of Food in the Last Year: Never true  Transportation Needs: No Transportation Needs (01/07/2022)   PRAPARE - Administrator, Civil Service (Medical): No    Lack of Transportation (Non-Medical): No  Physical Activity: Not on file  Stress: Not on file  Social Connections: Not on file  Intimate Partner Violence: Not At Risk (02/03/2022)    Humiliation, Afraid, Rape, and Kick questionnaire    Fear of Current or Ex-Partner: No    Emotionally Abused: No    Physically Abused: No    Sexually Abused: No     PHYSICAL EXAM  Vitals:   05/04/23 0944 05/04/23 0951  BP: (!) 183/79 (!) 164/75  Pulse:  65  Weight: 181 lb (82.1 kg)   Height: 5\' 2"  (1.575 m)     Body mass index is 33.11 kg/m.   General: The patient is well-developed and well-nourished and in no acute distress  Musculoskeletal: She has mild lumbar tenderness and moderately severe right and mild left - trochanteric bursa tenderness.  Neurologic Exam  Mental status: The patient is alert and oriented x 3 at the time of the examination.   apparently normal attention span, stm and concentration ability.   Speech is normal now.   .      Cranial nerves: Extraocular movements are full.  Visual fields are normal.  Facial strength and sensory are normal.  Hearing appeared to be symmetric  Motor:  Muscle bulk is normal but tone is symmetric, more on the left.. Strength is 4/5 in median innervated and 4+/5 and ulnar innervated intrinsic hand muscles.      Sensory:   Sensory testing is intact to touch and vibration in the arms.  She has reduced vibration sensation in the feet and ankles.  Coordination: Finger-nose-finger and heel-to-shin is performed better on the right than the left.  Gait and station: Station is normal.  The gait is wide.  She cannot do a tandem walk safely  Romberg is negative.  Reflexes:  Deep tendon reflexes are symmetric and normal bilaterally.      DIAGNOSTIC DATA (LABS, IMAGING, TESTING) - I reviewed patient records, labs, notes, testing and imaging myself where available.  Lab Results  Component Value Date   WBC 7.6 10/22/2022   HGB 13.5 10/22/2022   HCT 40.7 10/22/2022   MCV 89.3 10/22/2022   PLT 214 10/22/2022      Component Value Date/Time   NA 139 10/22/2022 1018   NA 138 07/12/2021 1031   K 4.6 10/22/2022 1018   CL 105  10/22/2022 1018   CO2 25 10/22/2022 1018   GLUCOSE 93 10/22/2022 1018   BUN 26 (H) 10/22/2022 1018   BUN 16 07/12/2021 1031   CREATININE 0.69 10/22/2022 1018   CALCIUM 9.2 10/22/2022 1018   PROT 6.1 04/15/2023 1029   ALBUMIN 4.3 04/15/2023 1029   AST 15 04/15/2023 1029   AST 20 03/09/2018 1342   ALT 14 04/15/2023 1029   ALT 22 03/09/2018 1342   ALKPHOS 115 04/15/2023 1029   BILITOT 0.3 04/15/2023 1029   BILITOT 0.4 03/09/2018 1342   GFRNONAA >60 05/14/2022 1720   GFRNONAA 91 01/25/2021 1527   GFRAA 105 01/25/2021 1527   Lab Results  Component Value Date   CHOL 211 (H) 04/15/2023   HDL 56 04/15/2023   LDLCALC 133 (H) 04/15/2023   LDLDIRECT 130.2 11/03/2011   TRIG 124 04/15/2023   CHOLHDL 3.8 04/15/2023   Lab Results  Component Value Date   HGBA1C 5.4 06/15/2021   No results found for: "VITAMINB12" Lab Results  Component Value Date   TSH 2.41 10/22/2022       ASSESSMENT AND PLAN  Multiple sclerosis (HCC)  Abnormality of gait  High risk medication use  Cerebrovascular accident (CVA), unspecified mechanism (HCC)  Trochanteric bursitis of both hips  Lumbar spondylolysis  Sjogren's syndrome with other organ involvement (HCC)  History of MI (myocardial infarction)   1.    She will remain off leflunomide .  She has had mild slow progression and she asked about palliative care.  Advised to talk more to one of the organinzations to see if this is an option for her.  She is not homebound though travel is more difficult.   2.  Detrol for bladder.   If not tolerated, consider Myrbetriq.     3.    Continue clonazepam and modafinil.   Renew modafinil at one po bid  4.    Try to stay active. 5.    Return to see me in about 6 months but is advised to call sooner if she has new or worsening neurologic symptoms.      Maleya Leever A. Epimenio Foot, MD, PhD 05/04/2023, 10:25 AM Certified in Neurology, Clinical Neurophysiology, Sleep Medicine, Pain Medicine and  Neuroimaging  [facility] [address] [place], [place] [address] [phone] \

## 2023-05-06 ENCOUNTER — Ambulatory Visit (HOSPITAL_BASED_OUTPATIENT_CLINIC_OR_DEPARTMENT_OTHER): Payer: Medicare Other | Admitting: Physical Therapy

## 2023-05-06 ENCOUNTER — Encounter (HOSPITAL_BASED_OUTPATIENT_CLINIC_OR_DEPARTMENT_OTHER): Payer: Self-pay | Admitting: Physical Therapy

## 2023-05-06 DIAGNOSIS — R252 Cramp and spasm: Secondary | ICD-10-CM

## 2023-05-06 DIAGNOSIS — R262 Difficulty in walking, not elsewhere classified: Secondary | ICD-10-CM

## 2023-05-06 DIAGNOSIS — M6281 Muscle weakness (generalized): Secondary | ICD-10-CM

## 2023-05-06 DIAGNOSIS — M5459 Other low back pain: Secondary | ICD-10-CM | POA: Diagnosis not present

## 2023-05-06 NOTE — Therapy (Signed)
OUTPATIENT PHYSICAL THERAPY THORACOLUMBAR Treatment    Patient Name: Dawn [name] MRN: [id number] DOB:1946-07-02, 77 y.o., female Today's Date: 05/06/2023  END OF SESSION:  PT End of Session - 05/06/23 1019     Visit Number 33    Number of Visits 43    Date for PT Re-Evaluation 07/15/23    Authorization Type [employer] note  at 33    Activity Tolerance Patient tolerated treatment well    Behavior During Therapy [facility] for tasks assessed/performed                 Past Medical History:  Diagnosis Date   Allergy    CAD (coronary artery disease)    s/p DES to RCA in 06/2021   Dry eyes    Endometrial polyp    Essential hypertension 08/25/2018   GERD (gastroesophageal reflux disease)    History of hiatal hernia    History of kidney stones    Hot flashes, menopausal 10/13/2011   Estradiol started    Hyperlipidemia    Jaundice as teenager   no problems since   Memory loss 09/21/2019     1/2 of feet numb all the time   Multiple sclerosis (HCC) dx 2001   Neuropathy    bilateral feet   Osteoarthritis    Sjogren's syndrome (HCC) dx oct 2021   sore muscvles, dry mouth and eyes   Stroke (HCC)    Vertigo    Vision abnormalities    Past Surgical History:  Procedure Laterality Date   ABDOMINAL HYSTERECTOMY  2002   partial   COLONOSCOPY  01/27/2022   2 day prep   colonscopy  2011   CORONARY STENT INTERVENTION N/A 07/18/2021   Procedure: CORONARY STENT INTERVENTION;  Surgeon: Corky Crafts, MD;  Location: [facility];  Service: Cardiovascular;  Laterality: N/A;   DILATATION & CURETTAGE/HYSTEROSCOPY WITH MYOSURE N/A 06/08/2020   Procedure: DILATATION & CURETTAGE/HYSTEROSCOPY/Polypectomy WITH MYOSURE;  Surgeon: Toy Baker, DO;  Location: [facility];  Service: Gynecology;  Laterality: N/A;   LEFT HEART CATH AND CORONARY ANGIOGRAPHY N/A 07/18/2021   Procedure: LEFT HEART CATH AND CORONARY ANGIOGRAPHY;  Surgeon: Corky Crafts, MD;   Location: [facility];  Service: Cardiovascular;  Laterality: N/A;   LUMBAR FUSION  2001   TOTAL HIP ARTHROPLASTY Right 01/17/2021   Procedure: TOTAL HIP ARTHROPLASTY ANTERIOR APPROACH;  Surgeon: Samson Frederic, MD;  Location: [facility];  Service: Orthopedics;  Laterality: Right;   UPPER GI ENDOSCOPY  yrs ago   Patient Active Problem List   Diagnosis Date Noted   Positive colorectal cancer screening using Cologuard test 11/14/2021   Coronary artery disease    Statin intolerance 04/19/2021   Osteoarthritis of right hip 01/17/2021   Status post THR (total hip replacement) 01/17/2021   Chronic bilateral low back pain without sciatica 10/02/2020   Chronic pain syndrome 06/20/2020   Post laminectomy syndrome 06/20/2020   Sjogren's disease (HCC) 05/23/2020   Primary osteoarthritis of both hands 05/23/2020   Primary osteoarthritis of both feet 05/23/2020   Other secondary scoliosis, lumbar region 12/22/2019   Spondylosis without myelopathy or radiculopathy, lumbar region 12/22/2019   Cervical radiculopathy 10/18/2019   Numbness 09/21/2019   Bilateral carpal tunnel syndrome 09/21/2019   High risk medication use 09/21/2019   Memory loss 09/21/2019   Essential hypertension 08/25/2018   Abnormal SPEP 02/19/2018   Hand pain 02/20/2017   Multiple joint pain 02/18/2017   Disturbed cognition 06/25/2016   Sciatica,  right side 10/30/2015   Chronic fatigue 08/04/2014   Gait disturbance 08/04/2014   Unspecified visual disturbance 01/31/2013   Transient vision disturbance 01/31/2013   Colon polyps 11/03/2011   POSTHERPETIC NEURALGIA 10/03/2009   DISPLCMT LUMBAR INTERVERT DISC W/O MYELOPATHY 09/05/2009   RENAL CALCULUS, RECURRENT 09/04/2009   Hyperlipidemia 08/31/2009   Multiple sclerosis (HCC) 08/31/2009   ALLERGIC RHINITIS 08/31/2009  Progress Note Reporting Period 7/25 to 04/23/2023  See note below for Objective Data and Assessment of Progress/Goals.     PCP: Iantha Fallen  NP  REFERRING PROVIDER: Dr Romero Belling   REFERRING DIAG: Low Back Pain   Rationale for Evaluation and Treatment: Rehabilitation  THERAPY DIAG:  Difficulty in walking, not elsewhere classified  Cramp and spasm  Other low back pain  Muscle weakness (generalized)  ONSET DATE: About 11 days ago her nerve abiliation wore off.   SUBJECTIVE:                                                                                                                                                                                           SUBJECTIVE STATEMENT: The patient had injections and feels better. She has been able to move more   PERTINENT HISTORY:  Right Hip replacement 2022, Low back Pain, MS, TIA October 2023; MI, November 2022; CAD, Sjorgens syndrome  PAIN:  Are you having pain? Yes: NPRS scale: 4/10 Pain location:umbar spine Pain description: aching  Aggravating factors: standing and walking  Relieving factors: rest    PRECAUTIONS: None  WEIGHT BEARING RESTRICTIONS: No  FALLS:  Has patient fallen in last 6 months? 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   LUMBAR ROM:   AROM eval 5/15   Flexion Limited 50 % with pain  Mild improvement but still about 50%  Improved pain but still limited  Extension  No limit     Right lateral flexion     Left lateral flexion     Right  rotation Limited 50% with pain     Left rotation Limited 50% with pain      (Blank rows = not tested)  LOWER EXTREMITY ROM:     Passive  Right eval Left eval Right  Right  7/24  Hip flexion 105 90 105 110 with pain at end range   Hip extension   10 degrees from neutral before manual today. Too neutral after stretching    Hip abduction      Hip adduction      Hip internal rotation painful Painful  Painful  Full with less pain   Hip external rotation   20 degrees with pain  45 with pain   Knee flexion      Knee extension      Ankle dorsiflexion      Ankle plantarflexion      Ankle inversion      Ankle eversion       (Blank rows = not tested)  LOWER EXTREMITY MMT:    MMT Right eval Left eval Right  3/26 Left  3/26 Right 5/15 Left 5/15 Right 7/24 Left 7/24 Right  Left   Hip flexion 13.1 12.3 15.1 26.1 18.2 26.2 11.3 19.1 15.3 23.4  Hip extension            Hip abduction 11.5 10.1 13.7 24.4 15.9 24 12.6 17.8 23.8 28.5  Hip adduction            Hip internal rotation            Hip external rotation            Knee flexion            Knee extension 14.0 13.3 17.5 33.3 20 38.2 19.4 18.3 23.8 23.6  Ankle dorsiflexion            Ankle plantarflexion            Ankle inversion            Ankle eversion             (Blank rows = not tested)  FUNCTIONAL TESTS:  5 times sit to stand: 28 sec  5/15 5 times sit to stand test 20 seconds 7/25 24 sec  10/16 17 sec   3-minute walk test 150 feet without requirement of seated rest break 5/15  7/25 3 min walk test 200'   10/16 3 min walk test 379'   GAIT:  TODAY'S TREATMENT:                                                                                                                              DATE:  10/16 Manual: trigger point release to gluteal and lower lumbar spine. Trigger point release to hips and spine; LAD to left left. Trigger point  release to peroneals on the right side.   Seated shoulder press 2x15 2lb  Seated punch 2x15 2lbs  Bicep curl 2x20  3 lbs   Completed strength and gait testing. Reviewed goals as it pertains to each.     10/9 Trigger Point Dry-Needling  Treatment instructions: Expect mild to moderate muscle soreness. S/S of pneumothorax if dry needled over a lung field, and to seek immediate medical attention should they occur. Patient verbalized understanding of these instructions and education.  Patient Consent Given: Yes Education handout provided: Yes Muscles treated: Right gluteal 3 spots using 0.30 X75 needle left gluteal 2 spots using a 0.30 x 75 needle Treatment response/outcome: Good twitch response; immediate pain relief.  Manual: trigger point release to gluteal and lower lumbar spine. Trigger point release to hips and spine; LAD to left left. Trigger point release toperoneals on the right side.   Seated: March green 2x15  Seated hip abdcution 2x15 green  Seated LAQ 2x15 each leg green    10/2  Trigger Point Dry-Needling  Treatment instructions: Expect mild to moderate muscle soreness. S/S of pneumothorax if dry needled over a lung field, and to seek immediate medical attention should they occur. Patient verbalized understanding of these instructions and education.  Patient Consent Given: Yes Education handout provided: Yes Muscles treated: Right gluteal 3 spots using 0.30 X75 needle left gluteal 2 spots using a 0.30 x 75 needle Treatment response/outcome: Good twitch response; immediate pain relief.  Manual: trigger point release to gluteal and lower lumbar spine. Trigger point release to hips and spine; LAD to left lef   Seated: March red 2x15  Seated hip abdcution 2x15 red  Seated LAQ 2x15 each leg        PATIENT EDUCATION:  Education details: reviewed HEP, symptom management  Person educated: Patient Education method: Explanation, Demonstration, Tactile cues, Verbal  cues, and Handouts Education comprehension: verbalized understanding, returned demonstration, verbal cues required, tactile cues required, and needs further education  HOME EXERCISE PROGRAM: Has previous HEP. Will review next visit  ASSESSMENT:  CLINICAL IMPRESSION:  The patient has progressed. She is having less pain and has progressed off of her walker to a cane. She had some cramping at about 375' during her 6 min walk test. She reports cramping at times when she walks community distances. Her strength has improved since her last re-assessment. She has returned to the pool at the [facility] center but has not been able to go consistently. She had a high level of pain in her back until her recent round of injections. Overall she would continue to benefit from skilled therapy. Her progressive disease along with back and lower leg issues cause a high level of skilled care to manage. She requires frequent adjustments to her exercise program and requires manual therapy to improve her ability to perform exercises. She would benefit from skilled therapy 1W10. See below for goals specific progress.     OBJECTIVE IMPAIRMENTS: Abnormal gait, decreased activity tolerance, decreased balance, decreased endurance, decreased knowledge of use of DME, decreased mobility, difficulty walking, decreased ROM, decreased strength, increased muscle spasms, and pain.   ACTIVITY LIMITATIONS: carrying, lifting, bending, standing, squatting, sleeping, stairs, transfers, and locomotion level  PARTICIPATION LIMITATIONS: meal prep, cleaning, laundry, driving, shopping, community activity, and yard work  PERSONAL FACTORS: Right Hip replacement 2022, Low back Pain, MS, TIA October 2023; MI, November 2022; CAD, Sjorgens syndrome are also affecting patient's functional outcome. MS  REHAB POTENTIAL: Good  CLINICAL DECISION MAKING: Evolving/moderate complexity declining mobility   EVALUATION COMPLEXITY:  Moderate   GOALS: Goals reviewed with patient? No  SHORT TERM GOALS: Target date: 09/29/2022  Patient will increase gross bilateral lower extremity strength by 5 pounds Baseline: Goal status: has returned to baseline from a few progress notes ago 10/16  2.  Patient will increase lumbar flexion by 10 degrees Baseline:  Goal status:  mild improvement but still painful 10/16  3.  Patient will be independent with basic after exercise program and stretching program Baseline:  Goal status: has basic HEP achieved 10/16   LONG TERM GOALS: Target date: 09/29/2022     Patient will stand for greater than 30 minutes without increased pain Baseline:  Goal status: improving 5/15  continued improvement 10/16   2.  Patient will pick item up off the floor without increased low back pain Baseline:  Goal status:still having pain 5/15 improved but still painful 10/16   3.  Patient will ambulate community distances without report of increased low back pain Baseline:  Goal status:limited by pain but maintaining 7/25  4.  Patient will report improved ability to sleep through the night secondary to back pain Baseline:  Goal status: continues to have pain at night 7/25    PLAN:  PT FREQUENCY: 1x/week  PT DURATION: 10 weeks   PLANNED INTERVENTIONS: Therapeutic exercises, Therapeutic activity, Neuromuscular re-education, Balance training, Gait training, Patient/Family education, Self Care, Joint mobilization, Stair training, DME instructions, Aquatic Therapy, Dry Needling, Spinal mobilization, Cryotherapy, Moist heat, Ionotophoresis 4mg /ml Dexamethasone, and Manual therapy.  PLAN FOR NEXT SESSION: Consider manual therapy to the back and hips, consider trigger point dry needling to back and hips.  Reviewed basic HEP.  Begin active strengthening of hips and core.  Begin gait training and functional mobility training.   Dessie Coma, PT 10/28/2022, 10:20 AM

## 2023-05-12 ENCOUNTER — Other Ambulatory Visit: Payer: Self-pay | Admitting: Student

## 2023-05-13 ENCOUNTER — Ambulatory Visit (HOSPITAL_BASED_OUTPATIENT_CLINIC_OR_DEPARTMENT_OTHER): Payer: Medicare Other | Admitting: Physical Therapy

## 2023-05-19 DIAGNOSIS — M7061 Trochanteric bursitis, right hip: Secondary | ICD-10-CM | POA: Diagnosis not present

## 2023-05-19 DIAGNOSIS — M7062 Trochanteric bursitis, left hip: Secondary | ICD-10-CM | POA: Diagnosis not present

## 2023-05-20 ENCOUNTER — Ambulatory Visit (HOSPITAL_BASED_OUTPATIENT_CLINIC_OR_DEPARTMENT_OTHER): Payer: Medicare Other | Admitting: Physical Therapy

## 2023-05-20 DIAGNOSIS — R252 Cramp and spasm: Secondary | ICD-10-CM

## 2023-05-20 DIAGNOSIS — M5459 Other low back pain: Secondary | ICD-10-CM | POA: Diagnosis not present

## 2023-05-20 DIAGNOSIS — R262 Difficulty in walking, not elsewhere classified: Secondary | ICD-10-CM

## 2023-05-20 DIAGNOSIS — M6281 Muscle weakness (generalized): Secondary | ICD-10-CM

## 2023-05-20 NOTE — Therapy (Signed)
OUTPATIENT PHYSICAL THERAPY THORACOLUMBAR Treatment    Patient Name: Dawn [name] MRN: [id number] DOB:12-30-1945, 77 y.o., female Today's Date: 05/20/2023  END OF SESSION:        Past Medical History:  Diagnosis Date   Allergy    CAD (coronary artery disease)    s/p DES to RCA in 06/2021   Dry eyes    Endometrial polyp    Essential hypertension 08/25/2018   GERD (gastroesophageal reflux disease)    History of hiatal hernia    History of kidney stones    Hot flashes, menopausal 10/13/2011   Estradiol started    Hyperlipidemia    Jaundice as teenager   no problems since   Memory loss 09/21/2019     1/2 of feet numb all the time   Multiple sclerosis (HCC) dx 2001   Neuropathy    bilateral feet   Osteoarthritis    Sjogren's syndrome (HCC) dx oct 2021   sore muscvles, dry mouth and eyes   Stroke (HCC)    Vertigo    Vision abnormalities    Past Surgical History:  Procedure Laterality Date   ABDOMINAL HYSTERECTOMY  2002   partial   COLONOSCOPY  01/27/2022   2 day prep   colonscopy  2011   CORONARY STENT INTERVENTION N/A 07/18/2021   Procedure: CORONARY STENT INTERVENTION;  Surgeon: Corky Crafts, MD;  Location: [facility];  Service: Cardiovascular;  Laterality: N/A;   DILATATION & CURETTAGE/HYSTEROSCOPY WITH MYOSURE N/A 06/08/2020   Procedure: DILATATION & CURETTAGE/HYSTEROSCOPY/Polypectomy WITH MYOSURE;  Surgeon: Toy Baker, DO;  Location: [facility];  Service: Gynecology;  Laterality: N/A;   LEFT HEART CATH AND CORONARY ANGIOGRAPHY N/A 07/18/2021   Procedure: LEFT HEART CATH AND CORONARY ANGIOGRAPHY;  Surgeon: Corky Crafts, MD;  Location: [facility];  Service: Cardiovascular;  Laterality: N/A;   LUMBAR FUSION  2001   TOTAL HIP ARTHROPLASTY Right 01/17/2021   Procedure: TOTAL HIP ARTHROPLASTY ANTERIOR APPROACH;  Surgeon: Samson Frederic, MD;  Location: [facility];  Service: Orthopedics;  Laterality: Right;    UPPER GI ENDOSCOPY  yrs ago   Patient Active Problem List   Diagnosis Date Noted   Positive colorectal cancer screening using Cologuard test 11/14/2021   Coronary artery disease    Statin intolerance 04/19/2021   Osteoarthritis of right hip 01/17/2021   Status post THR (total hip replacement) 01/17/2021   Chronic bilateral low back pain without sciatica 10/02/2020   Chronic pain syndrome 06/20/2020   Post laminectomy syndrome 06/20/2020   Sjogren's disease (HCC) 05/23/2020   Primary osteoarthritis of both hands 05/23/2020   Primary osteoarthritis of both feet 05/23/2020   Other secondary scoliosis, lumbar region 12/22/2019   Spondylosis without myelopathy or radiculopathy, lumbar region 12/22/2019   Cervical radiculopathy 10/18/2019   Numbness 09/21/2019   Bilateral carpal tunnel syndrome 09/21/2019   High risk medication use 09/21/2019   Memory loss 09/21/2019   Essential hypertension 08/25/2018   Abnormal SPEP 02/19/2018   Hand pain 02/20/2017   Multiple joint pain 02/18/2017   Disturbed cognition 06/25/2016   Sciatica, right side 10/30/2015   Chronic fatigue 08/04/2014   Gait disturbance 08/04/2014   Unspecified visual disturbance 01/31/2013   Transient vision disturbance 01/31/2013   Colon polyps 11/03/2011   POSTHERPETIC NEURALGIA 10/03/2009   DISPLCMT LUMBAR INTERVERT DISC W/O MYELOPATHY 09/05/2009   RENAL CALCULUS, RECURRENT 09/04/2009   Hyperlipidemia 08/31/2009   Multiple sclerosis (HCC) 08/31/2009   ALLERGIC RHINITIS 08/31/2009  Progress Note  Reporting Period 7/25 to 04/23/2023  See note below for Objective Data and Assessment of Progress/Goals.     PCP: Iantha Fallen NP  REFERRING PROVIDER: Dr Romero Belling   REFERRING DIAG: Low Back Pain   Rationale for Evaluation and Treatment: Rehabilitation  THERAPY DIAG:  No diagnosis found.  ONSET DATE: About 11 days ago her nerve abiliation wore off.   SUBJECTIVE:                                                                                                                                                                                            SUBJECTIVE STATEMENT: The patient had injections and feels better. She has been able to move more   PERTINENT HISTORY:  Right Hip replacement 2022, Low back Pain, MS, TIA October 2023; MI, November 2022; CAD, Sjorgens syndrome  PAIN:  Are you having pain? Yes: NPRS scale: 4/10 Pain location:umbar spine Pain description: aching  Aggravating factors: standing and walking  Relieving factors: rest    PRECAUTIONS: None  WEIGHT BEARING RESTRICTIONS: No  FALLS:  Has patient fallen in last 6 months? 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   LUMBAR ROM:   AROM eval 5/15   Flexion Limited 50 % with pain  Mild improvement but still about 50%  Improved pain but still limited  Extension No limit     Right lateral flexion     Left lateral flexion     Right rotation Limited 50% with pain     Left rotation Limited 50% with pain      (Blank rows = not tested)  LOWER EXTREMITY ROM:     Passive  Right eval Left eval Right  Right  7/24  Hip flexion 105 90 105 110 with pain at end range   Hip extension   10 degrees from neutral before manual today. Too neutral after stretching     Hip abduction      Hip  adduction      Hip internal rotation painful Painful  Painful  Full with less pain   Hip external rotation   20 degrees with pain  45 with pain   Knee flexion      Knee extension      Ankle dorsiflexion      Ankle plantarflexion      Ankle inversion      Ankle eversion       (Blank rows = not tested)  LOWER EXTREMITY MMT:    MMT Right eval Left eval Right  3/26 Left  3/26 Right 5/15 Left 5/15 Right 7/24 Left 7/24 Right  Left   Hip flexion 13.1 12.3 15.1 26.1 18.2 26.2 11.3 19.1 15.3 23.4  Hip extension            Hip abduction 11.5 10.1 13.7 24.4 15.9 24 12.6 17.8 23.8 28.5  Hip adduction            Hip internal rotation            Hip external rotation            Knee flexion            Knee extension 14.0 13.3 17.5 33.3 20 38.2 19.4 18.3 23.8 23.6  Ankle dorsiflexion            Ankle plantarflexion            Ankle inversion            Ankle eversion             (Blank rows = not tested)  FUNCTIONAL TESTS:  5 times sit to stand: 28 sec  5/15 5 times sit to stand test 20 seconds 7/25 24 sec  10/16 17 sec   3-minute walk test 150 feet without requirement of seated rest break 5/15  7/25 3 min walk test 200'   10/16 3 min walk test 379'   GAIT:  TODAY'S TREATMENT:                                                                                                                              DATE:  10/30 Manual: trigger point release to gluteal and lower lumbar spine. Trigger point release to hips and spine; LAD to left left. Trigger point release to peroneals on the right side.   Supine march 2x15  Supine hip abduction 2x15 red  Seated LAQ 3x15 red   Standing march 2x10  Standing weight shift 3x15   Nu-step 6 min L3      10/16 Manual: trigger point release to gluteal and lower lumbar spine. Trigger point release to hips and spine; LAD to left left. Trigger point release to peroneals on the right side.   Seated shoulder press  2x15 2lb  Seated punch 2x15 2lbs  Bicep curl 2x20 3 lbs   Completed strength and gait testing. Reviewed goals as it pertains to each.  10/9 Trigger Point Dry-Needling  Treatment instructions: Expect mild to moderate muscle soreness. S/S of pneumothorax if dry needled over a lung field, and to seek immediate medical attention should they occur. Patient verbalized understanding of these instructions and education.  Patient Consent Given: Yes Education handout provided: Yes Muscles treated: Right gluteal 3 spots using 0.30 X75 needle left gluteal 2 spots using a 0.30 x 75 needle Treatment response/outcome: Good twitch response; immediate pain relief.  Manual: trigger point release to gluteal and lower lumbar spine. Trigger point release to hips and spine; LAD to left left. Trigger point release toperoneals on the right side.   Seated: March green 2x15  Seated hip abdcution 2x15 green  Seated LAQ 2x15 each leg green    10/2  Trigger Point Dry-Needling  Treatment instructions: Expect mild to moderate muscle soreness. S/S of pneumothorax if dry needled over a lung field, and to seek immediate medical attention should they occur. Patient verbalized understanding of these instructions and education.  Patient Consent Given: Yes Education handout provided: Yes Muscles treated: Right gluteal 3 spots using 0.30 X75 needle left gluteal 2 spots using a 0.30 x 75 needle Treatment response/outcome: Good twitch response; immediate pain relief.  Manual: trigger point release to gluteal and lower lumbar spine. Trigger point release to hips and spine; LAD to left lef   Seated: March red 2x15  Seated hip abdcution 2x15 red  Seated LAQ 2x15 each leg        PATIENT EDUCATION:  Education details: reviewed HEP, symptom management  Person educated: Patient Education method: Explanation, Demonstration, Tactile cues, Verbal cues, and Handouts Education comprehension: verbalized understanding,  returned demonstration, verbal cues required, tactile cues required, and needs further education  HOME EXERCISE PROGRAM: Has previous HEP. Will review next visit  ASSESSMENT:  CLINICAL IMPRESSION:  The patient had less pain today. We focused more on strengthening exercises. She will have injections soon in her hips. She was advised when she is having less pain, to capitalize on her strengthening. She continues to have trigger points in her lumbar spine but they are improving.   OBJECTIVE IMPAIRMENTS: Abnormal gait, decreased activity tolerance, decreased balance, decreased endurance, decreased knowledge of use of DME, decreased mobility, difficulty walking, decreased ROM, decreased strength, increased muscle spasms, and pain.   ACTIVITY LIMITATIONS: carrying, lifting, bending, standing, squatting, sleeping, stairs, transfers, and locomotion level  PARTICIPATION LIMITATIONS: meal prep, cleaning, laundry, driving, shopping, community activity, and yard work  PERSONAL FACTORS: Right Hip replacement 2022, Low back Pain, MS, TIA October 2023; MI, November 2022; CAD, Sjorgens syndrome are also affecting patient's functional outcome. MS  REHAB POTENTIAL: Good  CLINICAL DECISION MAKING: Evolving/moderate complexity declining mobility   EVALUATION COMPLEXITY: Moderate   GOALS: Goals reviewed with patient? No  SHORT TERM GOALS: Target date: 09/29/2022    Patient will increase gross bilateral lower extremity strength by 5 pounds Baseline: Goal status: has returned to baseline from a few progress notes ago 10/16  2.  Patient will increase lumbar flexion by 10 degrees Baseline:  Goal status:  mild improvement but still painful 10/16  3.  Patient will be independent with basic after exercise program and stretching program Baseline:  Goal status: has basic HEP achieved 10/16   LONG TERM GOALS: Target date: 09/29/2022     Patient will stand for greater than 30 minutes without increased  pain Baseline:  Goal status: improving 5/15  continued improvement 10/16   2.  Patient will pick item up off the floor without  increased low back pain Baseline:  Goal status:still having pain 5/15 improved but still painful 10/16   3.  Patient will ambulate community distances without report of increased low back pain Baseline:  Goal status:limited by pain but maintaining 7/25  4.  Patient will report improved ability to sleep through the night secondary to back pain Baseline:  Goal status: continues to have pain at night 7/25    PLAN:  PT FREQUENCY: 1x/week  PT DURATION: 10 weeks   PLANNED INTERVENTIONS: Therapeutic exercises, Therapeutic activity, Neuromuscular re-education, Balance training, Gait training, Patient/Family education, Self Care, Joint mobilization, Stair training, DME instructions, Aquatic Therapy, Dry Needling, Spinal mobilization, Cryotherapy, Moist heat, Ionotophoresis 4mg /ml Dexamethasone, and Manual therapy.  PLAN FOR NEXT SESSION: Consider manual therapy to the back and hips, consider trigger point dry needling to back and hips.  Reviewed basic HEP.  Begin active strengthening of hips and core.  Begin gait training and functional mobility training.   Dessie Coma, PT 10/28/2022, 1:17 PM

## 2023-05-22 ENCOUNTER — Ambulatory Visit: Payer: Medicare Other | Admitting: Family

## 2023-05-22 ENCOUNTER — Encounter: Payer: Self-pay | Admitting: Family

## 2023-05-22 VITALS — BP 136/82 | HR 60 | Temp 96.9°F | Resp 18 | Ht 62.0 in | Wt 181.2 lb

## 2023-05-22 DIAGNOSIS — M069 Rheumatoid arthritis, unspecified: Secondary | ICD-10-CM | POA: Diagnosis not present

## 2023-05-22 DIAGNOSIS — M7062 Trochanteric bursitis, left hip: Secondary | ICD-10-CM | POA: Diagnosis not present

## 2023-05-22 DIAGNOSIS — E559 Vitamin D deficiency, unspecified: Secondary | ICD-10-CM | POA: Diagnosis not present

## 2023-05-22 DIAGNOSIS — I10 Essential (primary) hypertension: Secondary | ICD-10-CM

## 2023-05-22 DIAGNOSIS — M549 Dorsalgia, unspecified: Secondary | ICD-10-CM

## 2023-05-22 DIAGNOSIS — I25118 Atherosclerotic heart disease of native coronary artery with other forms of angina pectoris: Secondary | ICD-10-CM | POA: Insufficient documentation

## 2023-05-22 DIAGNOSIS — I5032 Chronic diastolic (congestive) heart failure: Secondary | ICD-10-CM | POA: Diagnosis not present

## 2023-05-22 DIAGNOSIS — E782 Mixed hyperlipidemia: Secondary | ICD-10-CM

## 2023-05-22 DIAGNOSIS — M7061 Trochanteric bursitis, right hip: Secondary | ICD-10-CM | POA: Diagnosis not present

## 2023-05-22 DIAGNOSIS — G35 Multiple sclerosis: Secondary | ICD-10-CM

## 2023-05-22 NOTE — Progress Notes (Signed)
Provider: Richarda Blade FNP-C   Annelies Coyt, [name], NP  Patient Care Team: Keenon Leitzel, [name], NP as PCP - General (Family Medicine) Maisie Fus, MD as PCP - Cardiology (Cardiology) Valeria Batman, MD (Inactive) as Consulting Physician (Orthopedic Surgery) [name], Pearletha Furl, MD (Neurology) Krista Blue, [place], OD (Optometry) Lynden Ang, NP as Nurse Practitioner (Obstetrics and Gynecology) Janalyn Harder, MD (Inactive) as Consulting Physician (Dermatology) Romero Belling, MD as Referring Physician (Physical Medicine and Rehabilitation)  Extended Emergency Contact Information Primary Emergency Contact: [facility] Phone: [phone] Mobile Phone: [phone] Relation: Friend Secondary Emergency Contact: Cranford,Jane Mobile Phone: [phone] Relation: Friend  Code Status:  Full Code  Goals of care: Advanced Directive information    05/22/2023   10:06 AM  Advanced Directives  Does Patient Have a Medical Advance Directive? Yes  Type of Estate agent of [place];Living will  Does patient want to make changes to medical advance directive? No - Patient declined  Copy of Healthcare Power of Attorney in Chart? Yes - validated most recent copy scanned in chart (See row information)     Chief Complaint  Patient presents with   Medical Management of Chronic Issues    6 month follow up need to discuss dexa scan also need to discus tdap and flu vaccines     HPI:  Pt is a 77 y.o. female seen today for 6 months follow up for medical management of chronic diseases.    OAB - Takes Detrol 1 mg at bedtime instead of 2 mg twice daily.   Back pain - had injection which has helped.denies any weakness ,numbness or tingling on extremities.  Hyperlipidemia - off statin due to muscle aches.she was started on Repatha.states has not been able to cook since her Leodis Binet is being worked on.  GERD - she changed her time medication then waits for one hour  before eating which has been working.Protonix was increased to twice daily which effective.      Past Medical History:  Diagnosis Date   Allergy    CAD (coronary artery disease)    s/p DES to RCA in 06/2021   Dry eyes    Endometrial polyp    Essential hypertension 08/25/2018   GERD (gastroesophageal reflux disease)    History of hiatal hernia    History of kidney stones    Hot flashes, menopausal 10/13/2011   Estradiol started    Hyperlipidemia    Jaundice as teenager   no problems since   Memory loss 09/21/2019     1/2 of feet numb all the time   Multiple sclerosis (HCC) dx 2001   Neuropathy    bilateral feet   Osteoarthritis    Sjogren's syndrome (HCC) dx oct 2021   sore muscvles, dry mouth and eyes   Stroke [facility])    Vertigo    Vision abnormalities    Past Surgical History:  Procedure Laterality Date   ABDOMINAL HYSTERECTOMY  2002   partial   COLONOSCOPY  01/27/2022   2 day prep   colonscopy  2011   CORONARY STENT INTERVENTION N/A 07/18/2021   Procedure: CORONARY STENT INTERVENTION;  Surgeon: Corky Crafts, MD;  Location: [facility] IN[facility]IVE [facility];  Service: Cardiovascular;  Laterality: N/A;   DILATATION & CURETTAGE/HYSTEROSCOPY WITH MYOSURE N/A 06/08/2020   Procedure: DILATATION & CURETTAGE/HYSTEROSCOPY/Polypectomy WITH MYOSURE;  Surgeon: Toy Baker, DO;  Location: [facility];  Service: Gynecology;  Laterality: N/A;   LEFT HEART CATH AND CORONARY ANGIOGRAPHY N/A 07/18/2021  Procedure: LEFT HEART CATH AND CORONARY ANGIOGRAPHY;  Surgeon: Corky Crafts, MD;  Location: [facility] INVASIVE CV LAB;  Service: Cardiovascular;  Laterality: N/A;   LUMBAR FUSION  2001   TOTAL HIP ARTHROPLASTY Right 01/17/2021   Procedure: TOTAL HIP ARTHROPLASTY ANTERIOR APPROACH;  Surgeon: Samson Frederic, MD;  Location: [facility];  Service: Orthopedics;  Laterality: Right;   UPPER GI ENDOSCOPY  yrs ago    Allergies  Allergen Reactions   Crestor [Rosuvastatin]      MYALGIA    Pravastatin    Statins Other (See Comments)    Muscle pain; Tolerates Crestor     Allergies as of 05/22/2023       Reactions   Crestor [rosuvastatin]    MYALGIA    Pravastatin    Statins Other (See Comments)   Muscle pain; Tolerates Crestor         Medication List        Accurate as of May 22, 2023 10:28 AM. If you have any questions, ask your nurse or doctor.          acetaminophen 500 MG tablet Commonly known as: TYLENOL Take 1,000 mg by mouth every 6 (six) hours as needed for mild pain or moderate pain.   amoxicillin 500 MG tablet Commonly known as: AMOXIL Take 4 tablets by mouth 1-2 hrs before dental work   aspirin EC 81 MG tablet Take 81 mg by mouth daily. Swallow whole.   carvedilol 6.25 MG tablet Commonly known as: COREG Take 1 tablet (6.25 mg total) by mouth 2 (two) times daily with a meal.   cholecalciferol 25 MCG (1000 UNIT) tablet Commonly known as: VITAMIN D3 Take 2,000 Units by mouth daily.   clonazePAM 0.5 MG tablet Commonly known as: KLONOPIN Take 1 tablet (0.5 mg total) by mouth at bedtime.   CoQ-10 100 MG Caps Take 100 mg by mouth daily.   diclofenac Sodium 1 % Gel Commonly known as: Voltaren Apply 2 g topically 4 (four) times daily.   DULoxetine 30 MG capsule Commonly known as: Cymbalta Take 1 capsule (30 mg total) by mouth daily.   famotidine 20 MG tablet Commonly known as: PEPCID Take 20 mg by mouth 2 (two) times daily as needed for heartburn or indigestion.   fexofenadine 180 MG tablet Commonly known as: ALLEGRA Take 180 mg by mouth at bedtime.   FLAXSEED OIL PO Take 5-10 mLs by mouth 3 (three) times a week. Power   fluticasone 50 MCG/ACT nasal spray Commonly known as: FLONASE Place 1 spray into both nostrils daily as needed for allergies or rhinitis.   hydrALAZINE 25 MG tablet Commonly known as: APRESOLINE Take 1 tablet (25 mg total) by mouth in the morning and at bedtime.   lidocaine 4 % Place 1  patch onto the skin daily as needed (mild pain). Remove & Discard patch within 12 hours or as directed by MD   losartan 100 MG tablet Commonly known as: COZAAR TAKE 1 TABLET(100 MG) BY MOUTH DAILY   melatonin 5 MG Tabs Take 5 mg by mouth at bedtime as needed (Sleep).   modafinil 200 MG tablet Commonly known as: PROVIGIL One po qAM and one po qNoon   nitroGLYCERIN 0.4 MG SL tablet Commonly known as: NITROSTAT Place 1 tablet (0.4 mg total) under the tongue every 5 (five) minutes as needed for chest pain.   OMEGA 3 500 PO Take 500 mg by mouth daily.   pantoprazole 40 MG tablet Commonly known as: PROTONIX Take 1  tablet (40 mg total) by mouth 2 (two) times daily.   Repatha SureClick 140 MG/ML Soaj Generic drug: Evolocumab Inject 140 mg into the skin every 14 (fourteen) days.   tolterodine 2 MG tablet Commonly known as: Detrol Take 1 tablet (2 mg total) by mouth 2 (two) times daily.        Review of Systems  Constitutional:  Negative for appetite change, chills, fatigue, fever and unexpected weight change.  HENT:  Negative for congestion, dental problem, ear discharge, ear pain, facial swelling, hearing loss, nosebleeds, postnasal drip, rhinorrhea, sinus pressure, sinus pain, sneezing, sore throat, tinnitus and trouble swallowing.   Eyes:  Negative for pain, discharge, redness, itching and visual disturbance.  Respiratory:  Negative for cough, chest tightness, shortness of breath and wheezing.   Cardiovascular:  Negative for chest pain, palpitations and leg swelling.  Gastrointestinal:  Negative for abdominal distention, abdominal pain, blood in stool, constipation, diarrhea, nausea and vomiting.  Endocrine: Negative for cold intolerance, heat intolerance, polydipsia, polyphagia and polyuria.  Genitourinary:  Negative for difficulty urinating, dysuria, flank pain, frequency and urgency.  Musculoskeletal:  Positive for arthralgias, back pain, gait problem and myalgias. Negative  for joint swelling, neck pain and neck stiffness.  Skin:  Negative for color change, pallor, rash and wound.  Neurological:  Negative for dizziness, syncope, speech difficulty, weakness, light-headedness, numbness and headaches.  Hematological:  Does not bruise/bleed easily.  Psychiatric/Behavioral:  Negative for agitation, behavioral problems, confusion, hallucinations, self-injury, sleep disturbance and suicidal ideas. The patient is not nervous/anxious.     Immunization History  Administered Date(s) Administered   Fluad Quad(high Dose 65+) 04/01/2019, 04/19/2021   Influenza Whole 05/14/2011   Influenza, High Dose Seasonal PF 04/30/2017, 05/15/2018, 05/18/2020   Influenza-Unspecified 03/04/2022   PFIZER(Purple Top)SARS-COV-2 Vaccination 08/27/2019, 09/21/2019, 03/07/2020, 11/16/2020   Pfizer Covid-19 Vaccine Bivalent Booster 80yrs & up 03/04/2022   Pfizer(Comirnaty)Fall Seasonal Vaccine 12 years and older 04/04/2023   Pneumococcal Conjugate-13 08/30/2018   Pneumococcal Polysaccharide-23 03/21/2009, 04/20/2020   Td (Adult), 2 Lf Tetanus Toxid, Preservative Free 12/04/2009   Zoster Recombinant(Shingrix) 04/04/2023   Zoster, Live 11/29/2009   Pertinent  Health Maintenance Due  Topic Date Due   DEXA SCAN  Never done   INFLUENZA VACCINE  02/19/2023      05/14/2022    5:29 PM 10/27/2022   10:08 AM 12/26/2022   10:07 AM 03/26/2023    1:53 PM 05/22/2023   10:04 AM  Fall Risk  Falls in the past year?  1 1 0 1  Was there an injury with Fall?  1 0  0  Fall Risk Category Calculator  2 1  2   (RETIRED) Patient Fall Risk Level Moderate fall risk      Patient at Risk for Falls Due to  History of fall(s) History of fall(s)  History of fall(s)  Fall risk Follow up  Falls evaluation completed Falls evaluation completed     Functional Status Survey:    Vitals:   05/22/23 0959 05/22/23 1011  BP: (!) 140/88 136/82  Pulse: 60   Resp: 18   Temp: (!) 96.9 F (36.1 C)   SpO2: 98%   Weight: 181  lb 3.2 oz (82.2 kg)   Height: 5\' 2"  (1.575 m)    Body mass index is 33.14 kg/m. Physical Exam Vitals reviewed.  Constitutional:      General: She is not in acute distress.    Appearance: Normal appearance. She is obese. She is not ill-appearing or diaphoretic.  HENT:  Head: Normocephalic.     Right Ear: Tympanic membrane, ear canal and external ear normal. There is no impacted cerumen.     Left Ear: Tympanic membrane, ear canal and external ear normal. There is no impacted cerumen.     Nose: Nose normal. No congestion or rhinorrhea.     Mouth/Throat:     Mouth: Mucous membranes are moist.     Pharynx: Oropharynx is clear. No oropharyngeal exudate or posterior oropharyngeal erythema.  Eyes:     General: No scleral icterus.       Right eye: No discharge.        Left eye: No discharge.     Extraocular Movements: Extraocular movements intact.     Conjunctiva/sclera: Conjunctivae normal.     Pupils: Pupils are equal, round, and reactive to light.  Neck:     Vascular: No carotid bruit.  Cardiovascular:     Rate and Rhythm: Normal rate and regular rhythm.     Pulses: Normal pulses.     Heart sounds: Normal heart sounds. No murmur heard.    No friction rub. No gallop.  Pulmonary:     Effort: Pulmonary effort is normal. No respiratory distress.     Breath sounds: Normal breath sounds. No wheezing, rhonchi or rales.  Chest:     Chest wall: No tenderness.  Abdominal:     General: Bowel sounds are normal. There is no distension.     Palpations: Abdomen is soft. There is no mass.     Tenderness: There is no abdominal tenderness. There is no right CVA tenderness, left CVA tenderness, guarding or rebound.  Musculoskeletal:        General: No swelling or tenderness. Normal range of motion.     Cervical back: Normal range of motion. No rigidity or tenderness.     Right lower leg: No edema.     Left lower leg: No edema.  Lymphadenopathy:     Cervical: No cervical adenopathy.  Skin:     General: Skin is warm and dry.     Coloration: Skin is not pale.     Findings: No bruising, erythema, lesion or rash.  Neurological:     Mental Status: She is alert and oriented to person, place, and time.     Cranial Nerves: No cranial nerve deficit.     Sensory: No sensory deficit.     Motor: No weakness.     Coordination: Coordination normal.     Gait: Gait abnormal.  Psychiatric:        Mood and Affect: Mood normal.        Speech: Speech normal.        Behavior: Behavior normal.        Thought Content: Thought content normal.        Judgment: Judgment normal.     Labs reviewed: Recent Labs    07/23/22 0958 10/22/22 1018  NA 137 139  K 4.2 4.6  CL 102 105  CO2 28 25  GLUCOSE 80 93  BUN 17 26*  CREATININE 0.68 0.69  CALCIUM 9.0 9.2   Recent Labs    07/23/22 0958 10/22/22 1018 04/15/23 1029  AST 18 15 15   ALT 17 17 14   ALKPHOS  --   --  115  BILITOT 0.4 0.3 0.3  PROT 6.1 5.9* 6.1  ALBUMIN  --   --  4.3   Recent Labs    07/23/22 0958 10/22/22 1018  WBC 5.5 7.6  NEUTROABS 4,202 6,209  HGB 13.2 13.5  HCT 39.4 40.7  MCV 91.2 89.3  PLT 200 214   Lab Results  Component Value Date   TSH 2.41 10/22/2022   Lab Results  Component Value Date   HGBA1C 5.4 06/15/2021   Lab Results  Component Value Date   CHOL 211 (H) 04/15/2023   HDL 56 04/15/2023   LDLCALC 133 (H) 04/15/2023   LDLDIRECT 130.2 11/03/2011   TRIG 124 04/15/2023   CHOLHDL 3.8 04/15/2023    Significant Diagnostic Results in last 30 days:  No results found.  Assessment/Plan  1. Essential hypertension Blood pressure slightly high at this visit -Continue to monitor blood pressure at home and notify provider if greater than 140/90 -Exercise limited due to MS -Continue on hydralazine, losartan  and carvedilol - CBC with Differential/Platelet - TSH  2. Mixed hyperlipidemia Previous LDL not at goal - continue on omega-3 fatty acids -Continue on Repatha  3. Multiple sclerosis  (HCC) Continue supportive  4. Chronic diastolic congestive heart failure (HCC) No signs of fluid overload  - Keep legs elevated when seated to keep swelling down  - check weight at least three times per week and notify provider for any abrupt weight gain > 3 lbs  - Reduce salt intake in diet    5. Musculoskeletal back pain Continue on over-the-counter analgesic  6. Rheumatoid arthritis involving right hand, unspecified whether rheumatoid factor present (HCC) Continue with over-the-counter analgesic  7. Atherosclerotic heart disease of native coronary artery with other forms of angina pectoris (HCC) Chest pain-free -Statin intolerance  8. Vitamin D deficiency Continue on vitamin D supplements - Vitamin D, 1,25-dihydroxy  Family/ staff Communication: Reviewed plan of care with patient verbalized understanding  Labs/tests ordered:  - CBC with Differential/Platelet - TSH - Vitamin D, 1,25-dihydroxy  Next Appointment : Return in about 6 months (around 11/19/2023) for medical mangement of chronic issues.Caesar Bookman, NP

## 2023-05-27 ENCOUNTER — Ambulatory Visit (HOSPITAL_BASED_OUTPATIENT_CLINIC_OR_DEPARTMENT_OTHER): Payer: Medicare Other | Attending: Physical Medicine and Rehabilitation | Admitting: Physical Therapy

## 2023-05-27 ENCOUNTER — Encounter (HOSPITAL_BASED_OUTPATIENT_CLINIC_OR_DEPARTMENT_OTHER): Payer: Self-pay | Admitting: Physical Therapy

## 2023-05-27 DIAGNOSIS — M6281 Muscle weakness (generalized): Secondary | ICD-10-CM | POA: Insufficient documentation

## 2023-05-27 DIAGNOSIS — M5459 Other low back pain: Secondary | ICD-10-CM | POA: Diagnosis not present

## 2023-05-27 DIAGNOSIS — R252 Cramp and spasm: Secondary | ICD-10-CM | POA: Diagnosis not present

## 2023-05-27 DIAGNOSIS — R262 Difficulty in walking, not elsewhere classified: Secondary | ICD-10-CM | POA: Insufficient documentation

## 2023-05-27 LAB — CBC WITH DIFFERENTIAL/PLATELET
Absolute Lymphocytes: 875 {cells}/uL (ref 850–3900)
Absolute Monocytes: 424 {cells}/uL (ref 200–950)
Basophils Absolute: 61 {cells}/uL (ref 0–200)
Basophils Relative: 1.1 %
Eosinophils Absolute: 160 {cells}/uL (ref 15–500)
Eosinophils Relative: 2.9 %
HCT: 42.5 % (ref 35.0–45.0)
Hemoglobin: 13.9 g/dL (ref 11.7–15.5)
MCH: 30.4 pg (ref 27.0–33.0)
MCHC: 32.7 g/dL (ref 32.0–36.0)
MCV: 93 fL (ref 80.0–100.0)
MPV: 10.2 fL (ref 7.5–12.5)
Monocytes Relative: 7.7 %
Neutro Abs: 3982 {cells}/uL (ref 1500–7800)
Neutrophils Relative %: 72.4 %
Platelets: 248 10*3/uL (ref 140–400)
RBC: 4.57 10*6/uL (ref 3.80–5.10)
RDW: 13.7 % (ref 11.0–15.0)
Total Lymphocyte: 15.9 %
WBC: 5.5 10*3/uL (ref 3.8–10.8)

## 2023-05-27 LAB — VITAMIN D 1,25 DIHYDROXY
Vitamin D 1, 25 (OH)2 Total: 42 pg/mL (ref 18–72)
Vitamin D2 1, 25 (OH)2: <8 pg/mL
Vitamin D3 1, 25 (OH)2: 42 pg/mL

## 2023-05-27 LAB — TSH: TSH: 2.85 m[IU]/L (ref 0.40–4.50)

## 2023-05-27 NOTE — Therapy (Signed)
OUTPATIENT PHYSICAL THERAPY THORACOLUMBAR Treatment    Patient Name: Dawn [name] MRN: [id number] DOB:04-25-1946, 77 y.o., female Today's Date: 05/27/2023  END OF SESSION:  PT End of Session - 05/27/23 1458     Visit Number 35    Number of Visits 43    Date for PT Re-Evaluation 07/15/23    Authorization Type [employer] note  at 33    PT Start Time 1145    PT Stop Time 1228    PT Time Calculation (min) 43 min    Activity Tolerance Patient tolerated treatment well    Behavior During Therapy [facility] for tasks assessed/performed                  Past Medical History:  Diagnosis Date   Allergy    CAD (coronary artery disease)    s/p DES to RCA in 06/2021   Dry eyes    Endometrial polyp    Essential hypertension 08/25/2018   GERD (gastroesophageal reflux disease)    History of hiatal hernia    History of kidney stones    Hot flashes, menopausal 10/13/2011   Estradiol started    Hyperlipidemia    Jaundice as teenager   no problems since   Memory loss 09/21/2019     1/2 of feet numb all the time   Multiple sclerosis (HCC) dx 2001   Neuropathy    bilateral feet   Osteoarthritis    Sjogren's syndrome (HCC) dx oct 2021   sore muscvles, dry mouth and eyes   Stroke (HCC)    Vertigo    Vision abnormalities    Past Surgical History:  Procedure Laterality Date   ABDOMINAL HYSTERECTOMY  2002   partial   COLONOSCOPY  01/27/2022   2 day prep   colonscopy  2011   CORONARY STENT INTERVENTION N/A 07/18/2021   Procedure: CORONARY STENT INTERVENTION;  Surgeon: Corky Crafts, MD;  Location: [facility];  Service: Cardiovascular;  Laterality: N/A;   DILATATION & CURETTAGE/HYSTEROSCOPY WITH MYOSURE N/A 06/08/2020   Procedure: DILATATION & CURETTAGE/HYSTEROSCOPY/Polypectomy WITH MYOSURE;  Surgeon: Toy Baker, DO;  Location: [facility];  Service: Gynecology;  Laterality: N/A;   LEFT HEART CATH AND CORONARY ANGIOGRAPHY N/A 07/18/2021    Procedure: LEFT HEART CATH AND CORONARY ANGIOGRAPHY;  Surgeon: Corky Crafts, MD;  Location: [facility];  Service: Cardiovascular;  Laterality: N/A;   LUMBAR FUSION  2001   TOTAL HIP ARTHROPLASTY Right 01/17/2021   Procedure: TOTAL HIP ARTHROPLASTY ANTERIOR APPROACH;  Surgeon: Samson Frederic, MD;  Location: [facility];  Service: Orthopedics;  Laterality: Right;   UPPER GI ENDOSCOPY  yrs ago   Patient Active Problem List   Diagnosis Date Noted   Rheumatoid arthritis involving right hand, unspecified whether rheumatoid factor present (HCC) 05/22/2023   Atherosclerotic heart disease of native coronary artery with other forms of angina pectoris (HCC) 05/22/2023   Positive colorectal cancer screening using Cologuard test 11/14/2021   Coronary artery disease    Statin intolerance 04/19/2021   Osteoarthritis of right hip 01/17/2021   Status post THR (total hip replacement) 01/17/2021   Chronic bilateral low back pain without sciatica 10/02/2020   Chronic pain syndrome 06/20/2020   Post laminectomy syndrome 06/20/2020   Sjogren's disease (HCC) 05/23/2020   Primary osteoarthritis of both hands 05/23/2020   Primary osteoarthritis of both feet 05/23/2020   Other secondary scoliosis, lumbar region 12/22/2019   Spondylosis without myelopathy or radiculopathy, lumbar region 12/22/2019  Cervical radiculopathy 10/18/2019   Numbness 09/21/2019   Bilateral carpal tunnel syndrome 09/21/2019   High risk medication use 09/21/2019   Memory loss 09/21/2019   Essential hypertension 08/25/2018   Abnormal SPEP 02/19/2018   Hand pain 02/20/2017   Multiple joint pain 02/18/2017   Disturbed cognition 06/25/2016   Sciatica, right side 10/30/2015   Chronic fatigue 08/04/2014   Gait disturbance 08/04/2014   Unspecified visual disturbance 01/31/2013   Transient vision disturbance 01/31/2013   Colon polyps 11/03/2011   POSTHERPETIC NEURALGIA 10/03/2009   DISPLCMT LUMBAR INTERVERT DISC W/O  MYELOPATHY 09/05/2009   RENAL CALCULUS, RECURRENT 09/04/2009   Hyperlipidemia 08/31/2009   Multiple sclerosis (HCC) 08/31/2009   ALLERGIC RHINITIS 08/31/2009  Progress Note Reporting Period 7/25 to 04/23/2023  See note below for Objective Data and Assessment of Progress/Goals.     PCP: Iantha Fallen NP  REFERRING PROVIDER: Dr Romero Belling   REFERRING DIAG: Low Back Pain   Rationale for Evaluation and Treatment: Rehabilitation  THERAPY DIAG:  Difficulty in walking, not elsewhere classified  Cramp and spasm  Other low back pain  Muscle weakness (generalized)  ONSET DATE: About 11 days ago her nerve abiliation wore off.   SUBJECTIVE:                                                                                                                                                                                           SUBJECTIVE STATEMENT: The patient had bilateral hip injections. She reports limited pain at this time. She has gone back to the gym and has begun swimming again.   PERTINENT HISTORY:  Right Hip replacement 2022, Low back Pain, MS, TIA October 2023; MI, November 2022; CAD, Sjorgens syndrome  PAIN:  Are you having pain? Yes: NPRS scale: 4/10 Pain location:umbar spine Pain description: aching  Aggravating factors: standing and walking  Relieving factors: rest    PRECAUTIONS: None  WEIGHT BEARING RESTRICTIONS: No  FALLS:  Has patient fallen in last 6 months? 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   LUMBAR ROM:   AROM eval 5/15 10/3  Flexion Limited 50 % with pain  Mild improvement but still about 50%  Improved pain but still limited  Extension No limit     Right lateral flexion     Left lateral flexion     Right rotation Limited 50% with pain     Left rotation Limited 50% with pain      (Blank rows = not tested)  LOWER EXTREMITY ROM:     Passive  Right eval Left eval Right  Right  7/24  Hip flexion 105 90 105 110 with pain at end range   Hip extension   10 degrees from neutral before manual today. Too neutral after stretching    Hip abduction      Hip adduction      Hip internal rotation painful Painful  Painful  Full with less pain   Hip external rotation   20 degrees with pain  45 with pain   Knee flexion      Knee extension      Ankle dorsiflexion      Ankle plantarflexion      Ankle inversion      Ankle eversion       (Blank rows = not tested)  LOWER EXTREMITY MMT:    MMT Right eval Left eval Right  3/26 Left  3/26 Right 5/15 Left 5/15 Right 7/24 Left 7/24 Right  10/2 Left 10/2  Hip flexion 13.1 12.3 15.1 26.1 18.2 26.2 11.3 19.1 15.3 23.4  Hip extension            Hip abduction 11.5 10.1 13.7 24.4 15.9 24 12.6 17.8 23.8 28.5  Hip adduction            Hip internal rotation            Hip external rotation            Knee flexion            Knee extension 14.0 13.3 17.5 33.3 20 38.2 19.4 18.3 23.8 23.6  Ankle dorsiflexion            Ankle plantarflexion            Ankle inversion            Ankle eversion             (Blank rows = not tested)  FUNCTIONAL TESTS:  5 times sit to stand: 28 sec  5/15 5 times sit to stand test 20 seconds 7/25 24 sec  10/16 17 sec   3-minute walk test 150 feet without requirement of seated rest  break 5/15  7/25 3 min walk test 200'   10/16 3 min walk test 379'   GAIT:  TODAY'S TREATMENT:  DATE:  11/6 Nu-step 5 min  Ball roll out fwd x10  Ball roll out lateral x10 each direction  [employer] press x20   Decompression position  [employer] press 3x10  Double knee to chest 3x10   Supine march 3x10         10/30 Manual: trigger point release to gluteal and lower lumbar spine. Trigger point release to hips and spine; LAD to left left. Trigger point release to peroneals on the right side.   Supine march 2x15  Supine hip abduction 2x15 red  Seated LAQ 3x15 red   Standing march 2x10  Standing weight shift 3x15   Nu-step 6 min L3      10/16 Manual: trigger point release to gluteal and lower lumbar spine. Trigger point release to hips and spine; LAD to left left. Trigger point release to peroneals on the right side.   Seated shoulder press 2x15 2lb  Seated punch 2x15 2lbs  Bicep curl 2x20 3 lbs   Completed strength and gait testing. Reviewed goals as it pertains to each.     10/9 Trigger Point Dry-Needling  Treatment instructions: Expect mild to moderate muscle soreness. S/S of pneumothorax if dry needled over a lung field, and to seek immediate medical attention should they occur. Patient verbalized understanding of these instructions and education.  Patient Consent Given: Yes Education handout provided: Yes Muscles treated: Right gluteal 3 spots using 0.30 X75 needle left gluteal 2 spots using a 0.30 x 75 needle Treatment response/outcome: Good twitch response; immediate pain relief.  Manual: trigger point release to gluteal and lower lumbar spine. Trigger point release to hips and spine; LAD to left left. Trigger point release toperoneals on the right side.   Seated: March green 2x15  Seated hip abdcution 2x15 green  Seated LAQ 2x15  each leg green    10/2  Trigger Point Dry-Needling  Treatment instructions: Expect mild to moderate muscle soreness. S/S of pneumothorax if dry needled over a lung field, and to seek immediate medical attention should they occur. Patient verbalized understanding of these instructions and education.  Patient Consent Given: Yes Education handout provided: Yes Muscles treated: Right gluteal 3 spots using 0.30 X75 needle left gluteal 2 spots using a 0.30 x 75 needle Treatment response/outcome: Good twitch response; immediate pain relief.  Manual: trigger point release to gluteal and lower lumbar spine. Trigger point release to hips and spine; LAD to left lef   Seated: March red 2x15  Seated hip abdcution 2x15 red  Seated LAQ 2x15 each leg        PATIENT EDUCATION:  Education details: reviewed HEP, symptom management  Person educated: Patient Education method: Explanation, Demonstration, Tactile cues, Verbal cues, and Handouts Education comprehension: verbalized understanding, returned demonstration, verbal cues required, tactile cues required, and needs further education  HOME EXERCISE PROGRAM: Has previous HEP. Will review next visit  ASSESSMENT:  CLINICAL IMPRESSION:  The patient had less pain today. We focused more on strengthening exercises. She will have injections soon in her hips. She was advised when she is having less pain, to capitalize on her strengthening. She continues to have trigger points in her lumbar spine but they are improving.   OBJECTIVE IMPAIRMENTS: Abnormal gait, decreased activity tolerance, decreased balance, decreased endurance, decreased knowledge of use of DME, decreased mobility, difficulty walking, decreased ROM, decreased strength, increased muscle spasms, and pain.   ACTIVITY LIMITATIONS: carrying, lifting, bending, standing, squatting, sleeping, stairs, transfers, and locomotion level  PARTICIPATION LIMITATIONS: meal prep, cleaning, laundry,  driving, shopping, community activity, and yard work  PERSONAL FACTORS: Right Hip replacement 2022, Low back Pain, MS, TIA October 2023; MI, November 2022; CAD, Sjorgens syndrome are also affecting patient's functional outcome. MS  REHAB POTENTIAL: Good  CLINICAL DECISION MAKING: Evolving/moderate complexity declining mobility   EVALUATION COMPLEXITY: Moderate   GOALS: Goals reviewed with patient? No  SHORT TERM GOALS: Target date: 09/29/2022    Patient will increase gross bilateral lower extremity strength by 5 pounds Baseline: Goal status: has returned to baseline from a few progress notes ago 10/16  2.  Patient will increase lumbar flexion by 10 degrees Baseline:  Goal status:  mild improvement but still painful 10/16  3.  Patient will be independent with basic after exercise program and stretching program Baseline:  Goal status: has basic HEP achieved 10/16   LONG TERM GOALS: Target date: 09/29/2022     Patient will stand for greater than 30 minutes without increased pain Baseline:  Goal status: improving 5/15  continued improvement 10/16   2.  Patient will pick item up off the floor without increased low back pain Baseline:  Goal status:still having pain 5/15 improved but still painful 10/16   3.  Patient will ambulate community distances without report of increased low back pain Baseline:  Goal status:limited by pain but maintaining 7/25  4.  Patient will report improved ability to sleep through the night secondary to back pain Baseline:  Goal status: continues to have pain at night 7/25    PLAN:  PT FREQUENCY: 1x/week  PT DURATION: 10 weeks   PLANNED INTERVENTIONS: Therapeutic exercises, Therapeutic activity, Neuromuscular re-education, Balance training, Gait training, Patient/Family education, Self Care, Joint mobilization, Stair training, DME instructions, Aquatic Therapy, Dry Needling, Spinal mobilization, Cryotherapy, Moist heat, Ionotophoresis  4mg /ml Dexamethasone, and Manual therapy.  PLAN FOR NEXT SESSION: Consider manual therapy to the back and hips, consider trigger point dry needling to back and hips.  Reviewed basic HEP.  Begin active strengthening of hips and core.  Begin gait training and functional mobility training.   Dessie Coma, PT 10/28/2022, 3:13 PM

## 2023-05-28 ENCOUNTER — Encounter (HOSPITAL_BASED_OUTPATIENT_CLINIC_OR_DEPARTMENT_OTHER): Payer: Self-pay | Admitting: Physical Therapy

## 2023-05-28 ENCOUNTER — Telehealth (HOSPITAL_BASED_OUTPATIENT_CLINIC_OR_DEPARTMENT_OTHER): Payer: Self-pay | Admitting: Cardiovascular Disease

## 2023-05-28 NOTE — Telephone Encounter (Signed)
Left message to call back  

## 2023-05-28 NOTE — Telephone Encounter (Signed)
   Pt c/o medication issue:  1. Name of Medication:   Evolocumab (REPATHA SURECLICK) 140 MG/ML SOAJ    2. How are you currently taking this medication (dosage and times per day)? As written  3. Are you having a reaction (difficulty breathing--STAT)? no  4. What is your medication issue? Pt is calling back, she would like to speak with Dr. Leonides Sake nurse again regarding this medication

## 2023-06-03 ENCOUNTER — Ambulatory Visit (HOSPITAL_BASED_OUTPATIENT_CLINIC_OR_DEPARTMENT_OTHER): Payer: Medicare Other | Admitting: Physical Therapy

## 2023-06-03 DIAGNOSIS — R262 Difficulty in walking, not elsewhere classified: Secondary | ICD-10-CM

## 2023-06-03 DIAGNOSIS — M5459 Other low back pain: Secondary | ICD-10-CM

## 2023-06-03 DIAGNOSIS — M6281 Muscle weakness (generalized): Secondary | ICD-10-CM

## 2023-06-03 DIAGNOSIS — R252 Cramp and spasm: Secondary | ICD-10-CM

## 2023-06-03 NOTE — Therapy (Unsigned)
OUTPATIENT PHYSICAL THERAPY THORACOLUMBAR Treatment    Patient Name: Dawn [name] MRN: [id number] DOB:1946-06-18, 77 y.o., female Today's Date: 06/04/2023  END OF SESSION:  PT End of Session - 06/04/23 1945     Visit Number 36    Number of Visits 43    Date for PT Re-Evaluation 07/15/23    Authorization Type [employer] note  at 33    PT Start Time 1100    PT Stop Time 1142    PT Time Calculation (min) 42 min    Activity Tolerance Patient tolerated treatment well    Behavior During Therapy [facility] for tasks assessed/performed                   Past Medical History:  Diagnosis Date   Allergy    CAD (coronary artery disease)    s/p DES to RCA in 06/2021   Dry eyes    Endometrial polyp    Essential hypertension 08/25/2018   GERD (gastroesophageal reflux disease)    History of hiatal hernia    History of kidney stones    Hot flashes, menopausal 10/13/2011   Estradiol started    Hyperlipidemia    Jaundice as teenager   no problems since   Memory loss 09/21/2019     1/2 of feet numb all the time   Multiple sclerosis (HCC) dx 2001   Neuropathy    bilateral feet   Osteoarthritis    Sjogren's syndrome (HCC) dx oct 2021   sore muscvles, dry mouth and eyes   Stroke (HCC)    Vertigo    Vision abnormalities    Past Surgical History:  Procedure Laterality Date   ABDOMINAL HYSTERECTOMY  2002   partial   COLONOSCOPY  01/27/2022   2 day prep   colonscopy  2011   CORONARY STENT INTERVENTION N/A 07/18/2021   Procedure: CORONARY STENT INTERVENTION;  Surgeon: Corky Crafts, MD;  Location: [facility];  Service: Cardiovascular;  Laterality: N/A;   DILATATION & CURETTAGE/HYSTEROSCOPY WITH MYOSURE N/A 06/08/2020   Procedure: DILATATION & CURETTAGE/HYSTEROSCOPY/Polypectomy WITH MYOSURE;  Surgeon: Toy Baker, DO;  Location: [facility];  Service: Gynecology;  Laterality: N/A;   LEFT HEART CATH AND CORONARY ANGIOGRAPHY N/A 07/18/2021    Procedure: LEFT HEART CATH AND CORONARY ANGIOGRAPHY;  Surgeon: Corky Crafts, MD;  Location: [facility] CV [facility];  Service: Cardiovascular;  Laterality: N/A;   LUMBAR FUSION  2001   TOTAL HIP ARTHROPLASTY Right 01/17/2021   Procedure: TOTAL HIP ARTHROPLASTY ANTERIOR APPROACH;  Surgeon: Samson Frederic, MD;  Location: [facility];  Service: Orthopedics;  Laterality: Right;   UPPER GI ENDOSCOPY  yrs ago   Patient Active Problem List   Diagnosis Date Noted   Rheumatoid arthritis involving right hand, unspecified whether rheumatoid factor present (HCC) 05/22/2023   Atherosclerotic heart disease of native coronary artery with other forms of angina pectoris (HCC) 05/22/2023   Positive colorectal cancer screening using Cologuard test 11/14/2021   Coronary artery disease    Statin intolerance 04/19/2021   Osteoarthritis of right hip 01/17/2021   Status post THR (total hip replacement) 01/17/2021   Chronic bilateral low back pain without sciatica 10/02/2020   Chronic pain syndrome 06/20/2020   Post laminectomy syndrome 06/20/2020   Sjogren's disease (HCC) 05/23/2020   Primary osteoarthritis of both hands 05/23/2020   Primary osteoarthritis of both feet 05/23/2020   Other secondary scoliosis, lumbar region 12/22/2019   Spondylosis without myelopathy or radiculopathy, lumbar region 12/22/2019  Cervical radiculopathy 10/18/2019   Numbness 09/21/2019   Bilateral carpal tunnel syndrome 09/21/2019   High risk medication use 09/21/2019   Memory loss 09/21/2019   Essential hypertension 08/25/2018   Abnormal SPEP 02/19/2018   Hand pain 02/20/2017   Multiple joint pain 02/18/2017   Disturbed cognition 06/25/2016   Sciatica, right side 10/30/2015   Chronic fatigue 08/04/2014   Gait disturbance 08/04/2014   Unspecified visual disturbance 01/31/2013   Transient vision disturbance 01/31/2013   Colon polyps 11/03/2011   POSTHERPETIC NEURALGIA 10/03/2009   DISPLCMT LUMBAR INTERVERT DISC W/O  MYELOPATHY 09/05/2009   RENAL CALCULUS, RECURRENT 09/04/2009   Hyperlipidemia 08/31/2009   Multiple sclerosis (HCC) 08/31/2009   ALLERGIC RHINITIS 08/31/2009  Progress Note Reporting Period 7/25 to 04/23/2023  See note below for Objective Data and Assessment of Progress/Goals.     PCP: Iantha Fallen NP  REFERRING PROVIDER: Dr Romero Belling   REFERRING DIAG: Low Back Pain   Rationale for Evaluation and Treatment: Rehabilitation  THERAPY DIAG:  Difficulty in walking, not elsewhere classified  Cramp and spasm  Other low back pain  Muscle weakness (generalized)  ONSET DATE: About 11 days ago her nerve abiliation wore off.   SUBJECTIVE:                                                                                                                                                                                           SUBJECTIVE STATEMENT: The patient had bilateral hip injections. She reports limited pain at this time. She has gone back to the gym and has begun swimming again.   PERTINENT HISTORY:  Right Hip replacement 2022, Low back Pain, MS, TIA October 2023; MI, November 2022; CAD, Sjorgens syndrome  PAIN:  Are you having pain? Yes: NPRS scale: 4/10 Pain location:umbar spine Pain description: aching  Aggravating factors: standing and walking  Relieving factors: rest    PRECAUTIONS: None  WEIGHT BEARING RESTRICTIONS: No  FALLS:  Has patient fallen in last 6 months? 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   LUMBAR ROM:   AROM eval 5/15 10/3  Flexion Limited 50 % with pain  Mild improvement but still about 50%  Improved pain but still limited  Extension No limit     Right lateral flexion     Left lateral flexion     Right rotation Limited 50% with pain     Left rotation Limited 50% with pain      (Blank rows = not tested)  LOWER EXTREMITY ROM:     Passive  Right eval Left eval Right  Right  7/24  Hip flexion 105 90 105 110 with pain at end range   Hip extension   10 degrees from neutral before manual today. Too neutral after stretching    Hip abduction      Hip adduction      Hip internal rotation painful Painful  Painful  Full with less pain   Hip external rotation   20 degrees with pain  45 with pain   Knee flexion      Knee extension      Ankle dorsiflexion      Ankle plantarflexion      Ankle inversion      Ankle eversion       (Blank rows = not tested)  LOWER EXTREMITY MMT:    MMT Right eval Left eval Right  3/26 Left  3/26 Right 5/15 Left 5/15 Right 7/24 Left 7/24 Right  10/2 Left 10/2  Hip flexion 13.1 12.3 15.1 26.1 18.2 26.2 11.3 19.1 15.3 23.4  Hip extension            Hip abduction 11.5 10.1 13.7 24.4 15.9 24 12.6 17.8 23.8 28.5  Hip adduction            Hip internal rotation            Hip external rotation            Knee flexion            Knee extension 14.0 13.3 17.5 33.3 20 38.2 19.4 18.3 23.8 23.6  Ankle dorsiflexion            Ankle plantarflexion            Ankle inversion            Ankle eversion             (Blank rows = not tested)  FUNCTIONAL TESTS:  5 times sit to stand: 28 sec  5/15 5 times sit to stand test 20 seconds 7/25 24 sec  10/16 17 sec   3-minute walk test 150 feet without requirement of seated rest  break 5/15  7/25 3 min walk test 200'   10/16 3 min walk test 379'   GAIT:  TODAY'S TREATMENT:  DATE:  11/13 Nu-step 5 min  Ball roll out fwd x10  Ball roll out lateral x10 each direction  [employer] press x20   Decompression position  [employer] press 3x10    Supine marches 3x10  Hip adduction ball squeezes 2x15 Supine Clam shells 2x12 GTB  Standing weight shifts on airex pad x20 Semi-tandem stance on airex pad bil 2x30"  LAQ 3x10    11/6 Nu-step 5 min  Ball roll out fwd x10  Ball roll out lateral x10 each direction  Ball press x20   Decompression position  Ball press 3x10  Double knee to chest 3x10   Supine march 3x10         10/30 Manual: trigger point release to gluteal and lower lumbar spine. Trigger point release to hips and spine; LAD to left left. Trigger point release to peroneals on the right side.   Supine march 2x15  Supine hip abduction 2x15 red  Seated LAQ 3x15 red   Standing march 2x10  Standing weight shift 3x15   Nu-step 6 min L3      10/16 Manual: trigger point release to gluteal and lower lumbar spine. Trigger point release to hips and spine; LAD to left left. Trigger point release to peroneals on the right side.   Seated shoulder press 2x15 2lb  Seated punch 2x15 2lbs  Bicep curl 2x20 3 lbs   Completed strength and gait testing. Reviewed goals as it pertains to each.     10/9 Trigger Point Dry-Needling  Treatment instructions: Expect mild to moderate muscle soreness. S/S of pneumothorax if dry needled over a lung field, and to seek immediate medical attention should they occur. Patient verbalized understanding of these instructions and education.  Patient Consent Given: Yes Education handout provided: Yes Muscles treated: Right gluteal 3 spots using 0.30 X75 needle left gluteal 2 spots using a 0.30 x 75  needle Treatment response/outcome: Good twitch response; immediate pain relief.  Manual: trigger point release to gluteal and lower lumbar spine. Trigger point release to hips and spine; LAD to left left. Trigger point release toperoneals on the right side.   Seated: March green 2x15  Seated hip abdcution 2x15 green  Seated LAQ 2x15 each leg green      PATIENT EDUCATION:  Education details: reviewed HEP, symptom management  Person educated: Patient Education method: Explanation, Demonstration, Tactile cues, Verbal cues, and Handouts Education comprehension: verbalized understanding, returned demonstration, verbal cues required, tactile cues required, and needs further education  HOME EXERCISE PROGRAM: Has previous HEP. Will review next visit  ASSESSMENT:  CLINICAL IMPRESSION: The patient tolerated treatment well by completing all exercises without increased pain or modification. Session focused on global hip strengthening and NMRE to promote balance in standing/dynamic tasks. Pt able to complete balance interventions with increased independence  The patient tolerated treatment well.  We were able to focus more on exercise today.  She was advised moderate shots are working to try the exercises much as possible without exacerbating her pain.  We worked on decompression position today.  We also work on stretching the lower lumbar spine.  Therapy will continue to progress as tolerated.  OBJECTIVE IMPAIRMENTS: Abnormal gait, decreased activity tolerance, decreased balance, decreased endurance, decreased knowledge of use of DME, decreased mobility, difficulty walking, decreased ROM, decreased strength, increased muscle spasms, and pain.   ACTIVITY LIMITATIONS: carrying, lifting, bending, standing, squatting, sleeping, stairs, transfers, and locomotion level  PARTICIPATION LIMITATIONS: meal prep, cleaning, laundry, driving, shopping, community activity, and yard work  PERSONAL  FACTORS:  Right Hip replacement 2022, Low back Pain, MS, TIA October 2023; MI, November 2022; CAD, Sjorgens syndrome are also affecting patient's functional outcome. MS  REHAB POTENTIAL: Good  CLINICAL DECISION MAKING: Evolving/moderate complexity declining mobility   EVALUATION COMPLEXITY: Moderate   GOALS: Goals reviewed with patient? No  SHORT TERM GOALS: Target date: 09/29/2022    Patient will increase gross bilateral lower extremity strength by 5 pounds Baseline: Goal status: has returned to baseline from a few progress notes ago 10/16  2.  Patient will increase lumbar flexion by 10 degrees Baseline:  Goal status:  mild improvement but still painful 10/16  3.  Patient will be independent with basic after exercise program and stretching program Baseline:  Goal status: has basic HEP achieved 10/16   LONG TERM GOALS: Target date: 09/29/2022     Patient will stand for greater than 30 minutes without increased pain Baseline:  Goal status: improving 5/15  continued improvement 10/16   2.  Patient will pick item up off the floor without increased low back pain Baseline:  Goal status:still having pain 5/15 improved but still painful 10/16   3.  Patient will ambulate community distances without report of increased low back pain Baseline:  Goal status:limited by pain but maintaining 7/25  4.  Patient will report improved ability to sleep through the night secondary to back pain Baseline:  Goal status: continues to have pain at night 7/25    PLAN:  PT FREQUENCY: 1x/week  PT DURATION: 10 weeks   PLANNED INTERVENTIONS: Therapeutic exercises, Therapeutic activity, Neuromuscular re-education, Balance training, Gait training, Patient/Family education, Self Care, Joint mobilization, Stair training, DME instructions, Aquatic Therapy, Dry Needling, Spinal mobilization, Cryotherapy, Moist heat, Ionotophoresis 4mg /ml Dexamethasone, and Manual therapy.  PLAN FOR NEXT SESSION: Consider  manual therapy to the back and hips, consider trigger point dry needling to back and hips.  Reviewed basic HEP.  Begin active strengthening of hips and core.  Begin gait training and functional mobility training.  [facility], SPT  This entire session was performed under the direct supervision of a licensed PT.    Dessie Coma, PT 10/28/2022, 7:45 PM

## 2023-06-04 ENCOUNTER — Encounter (HOSPITAL_BASED_OUTPATIENT_CLINIC_OR_DEPARTMENT_OTHER): Payer: Self-pay | Admitting: Physical Therapy

## 2023-06-10 ENCOUNTER — Telehealth: Payer: Self-pay | Admitting: Neurology

## 2023-06-10 ENCOUNTER — Ambulatory Visit (HOSPITAL_BASED_OUTPATIENT_CLINIC_OR_DEPARTMENT_OTHER): Payer: Medicare Other | Admitting: Physical Therapy

## 2023-06-10 DIAGNOSIS — R252 Cramp and spasm: Secondary | ICD-10-CM

## 2023-06-10 DIAGNOSIS — M5459 Other low back pain: Secondary | ICD-10-CM

## 2023-06-10 DIAGNOSIS — R262 Difficulty in walking, not elsewhere classified: Secondary | ICD-10-CM

## 2023-06-10 DIAGNOSIS — M6281 Muscle weakness (generalized): Secondary | ICD-10-CM | POA: Diagnosis not present

## 2023-06-10 MED ORDER — CLONAZEPAM 0.5 MG PO TABS: 0.5000 mg | ORAL_TABLET | Freq: Every day | ORAL | 1 refills | Status: DC

## 2023-06-10 NOTE — Telephone Encounter (Signed)
Pt called wanting to request that her clonazePAM (KLONOPIN) 0.5 MG tablet is sent to the [facility] on [address] Also she is wanting to inform the provider that she will be dropping off a Handicap Placard form to be filled out due to her current one nearing expiration.

## 2023-06-10 NOTE — Telephone Encounter (Signed)
Last seen on 05/04/23 Follow up scheduled on 11/09/23 Last filled on 03/07/23 #90 tablets (90 day supply) Rx pending to be signed

## 2023-06-10 NOTE — Therapy (Signed)
OUTPATIENT PHYSICAL THERAPY THORACOLUMBAR Treatment    Patient Name: Dawn [name] MRN: [id number] DOB:September 03, 1945, 77 y.o., female Today's Date: 06/10/2023  END OF SESSION:  PT End of Session - 06/10/23 1453     Visit Number 37    Number of Visits 43    Date for PT Re-Evaluation 07/15/23    Authorization Type [employer] note  at 33    PT Start Time 1015    PT Stop Time 1058    PT Time Calculation (min) 43 min    Activity Tolerance Patient tolerated treatment well                    Past Medical History:  Diagnosis Date   Allergy    CAD (coronary artery disease)    s/p DES to RCA in 06/2021   Dry eyes    Endometrial polyp    Essential hypertension 08/25/2018   GERD (gastroesophageal reflux disease)    History of hiatal hernia    History of kidney stones    Hot flashes, menopausal 10/13/2011   Estradiol started    Hyperlipidemia    Jaundice as teenager   no problems since   Memory loss 09/21/2019     1/2 of feet numb all the time   Multiple sclerosis (HCC) dx 2001   Neuropathy    bilateral feet   Osteoarthritis    Sjogren's syndrome (HCC) dx oct 2021   sore muscvles, dry mouth and eyes   Stroke (HCC)    Vertigo    Vision abnormalities    Past Surgical History:  Procedure Laterality Date   ABDOMINAL HYSTERECTOMY  2002   partial   COLONOSCOPY  01/27/2022   2 day prep   colonscopy  2011   CORONARY STENT INTERVENTION N/A 07/18/2021   Procedure: CORONARY STENT INTERVENTION;  Surgeon: Corky Crafts, MD;  Location: [facility];  Service: Cardiovascular;  Laterality: N/A;   DILATATION & CURETTAGE/HYSTEROSCOPY WITH MYOSURE N/A 06/08/2020   Procedure: DILATATION & CURETTAGE/HYSTEROSCOPY/Polypectomy WITH MYOSURE;  Surgeon: Toy Baker, DO;  Location: [facility];  Service: Gynecology;  Laterality: N/A;   LEFT HEART CATH AND CORONARY ANGIOGRAPHY N/A 07/18/2021   Procedure: LEFT HEART CATH AND CORONARY ANGIOGRAPHY;  Surgeon:  Corky Crafts, MD;  Location: [facility];  Service: Cardiovascular;  Laterality: N/A;   LUMBAR FUSION  2001   TOTAL HIP ARTHROPLASTY Right 01/17/2021   Procedure: TOTAL HIP ARTHROPLASTY ANTERIOR APPROACH;  Surgeon: Samson Frederic, MD;  Location: [facility];  Service: Orthopedics;  Laterality: Right;   UPPER GI ENDOSCOPY  yrs ago   Patient Active Problem List   Diagnosis Date Noted   Rheumatoid arthritis involving right hand, unspecified whether rheumatoid factor present (HCC) 05/22/2023   Atherosclerotic heart disease of native coronary artery with other forms of angina pectoris (HCC) 05/22/2023   Positive colorectal cancer screening using Cologuard test 11/14/2021   Coronary artery disease    Statin intolerance 04/19/2021   Osteoarthritis of right hip 01/17/2021   Status post THR (total hip replacement) 01/17/2021   Chronic bilateral low back pain without sciatica 10/02/2020   Chronic pain syndrome 06/20/2020   Post laminectomy syndrome 06/20/2020   Sjogren's disease (HCC) 05/23/2020   Primary osteoarthritis of both hands 05/23/2020   Primary osteoarthritis of both feet 05/23/2020   Other secondary scoliosis, lumbar region 12/22/2019   Spondylosis without myelopathy or radiculopathy, lumbar region 12/22/2019   Cervical radiculopathy 10/18/2019   Numbness 09/21/2019  Bilateral carpal tunnel syndrome 09/21/2019   High risk medication use 09/21/2019   Memory loss 09/21/2019   Essential hypertension 08/25/2018   Abnormal SPEP 02/19/2018   Hand pain 02/20/2017   Multiple joint pain 02/18/2017   Disturbed cognition 06/25/2016   Sciatica, right side 10/30/2015   Chronic fatigue 08/04/2014   Gait disturbance 08/04/2014   Unspecified visual disturbance 01/31/2013   Transient vision disturbance 01/31/2013   Colon polyps 11/03/2011   POSTHERPETIC NEURALGIA 10/03/2009   DISPLCMT LUMBAR INTERVERT DISC W/O MYELOPATHY 09/05/2009   RENAL CALCULUS, RECURRENT 09/04/2009    Hyperlipidemia 08/31/2009   Multiple sclerosis (HCC) 08/31/2009   ALLERGIC RHINITIS 08/31/2009  Progress Note Reporting Period 7/25 to 04/23/2023  See note below for Objective Data and Assessment of Progress/Goals.     PCP: Iantha Fallen NP  REFERRING PROVIDER: Dr Romero Belling   REFERRING DIAG: Low Back Pain   Rationale for Evaluation and Treatment: Rehabilitation  THERAPY DIAG:  Difficulty in walking, not elsewhere classified  Cramp and spasm  Other low back pain  Muscle weakness (generalized)  ONSET DATE: About 11 days ago her nerve abiliation wore off.   SUBJECTIVE:                                                                                                                                                                                           SUBJECTIVE STATEMENT: A little soreness in the right hip but  otherwise she is doing well . PERTINENT HISTORY:  Right Hip replacement 2022, Low back Pain, MS, TIA October 2023; MI, November 2022; CAD, Sjorgens syndrome  PAIN:  Are you having pain? Yes: NPRS scale: 4/10 Pain location:umbar spine Pain description: aching  Aggravating factors: standing and walking  Relieving factors: rest    PRECAUTIONS: None  WEIGHT BEARING RESTRICTIONS: No  FALLS:  Has patient fallen in last 6 months? 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   LUMBAR ROM:   AROM eval  5/15 10/3  Flexion Limited 50 % with pain  Mild improvement but still about 50%  Improved pain but still limited  Extension No limit     Right lateral flexion     Left lateral flexion     Right rotation Limited 50% with pain     Left rotation Limited 50% with pain      (Blank rows = not tested)  LOWER EXTREMITY ROM:     Passive  Right eval Left eval Right  Right  7/24  Hip flexion 105 90 105 110 with pain at end range   Hip extension   10 degrees from neutral before manual today. Too neutral after stretching    Hip abduction      Hip adduction      Hip internal rotation painful Painful  Painful  Full with less pain   Hip external rotation   20 degrees with pain  45 with pain   Knee flexion      Knee extension      Ankle dorsiflexion      Ankle plantarflexion      Ankle inversion      Ankle eversion       (Blank rows = not tested)  LOWER EXTREMITY MMT:    MMT Right eval Left eval Right  3/26 Left  3/26 Right 5/15 Left 5/15 Right 7/24 Left 7/24 Right  10/2 Left 10/2  Hip flexion 13.1 12.3 15.1 26.1 18.2 26.2 11.3 19.1 15.3 23.4  Hip extension            Hip abduction 11.5 10.1 13.7 24.4 15.9 24 12.6 17.8 23.8 28.5  Hip adduction            Hip internal rotation            Hip external rotation            Knee flexion            Knee extension 14.0 13.3 17.5 33.3 20 38.2 19.4 18.3 23.8 23.6  Ankle dorsiflexion            Ankle plantarflexion            Ankle inversion            Ankle eversion             (Blank rows = not tested)  FUNCTIONAL TESTS:  5 times sit to stand: 28 sec  5/15 5 times sit to stand test 20 seconds 7/25 24 sec  10/16 17 sec   3-minute walk test 150 feet without requirement of seated rest break 5/15  7/25 3 min walk test 200'   10/16 3 min walk test 379'   GAIT:  TODAY'S TREATMENT:                                                                                                                               DATE:  11/20  Nu-step 5 min L5  Ball roll out fwd x10  Ball roll out lateral x10 each direction  [employer] 3x15  Shoulder extension 3x15 green   Bilateral er red 3x15  Horizontal abduction 3x15 red  Bicpes curl 3lbs 3x15 red   All with cuing for abdominal breathing.      11/13 Nu-step 5 min  Ball roll out fwd x10  Ball roll out lateral x10 each direction  [employer] press x20   Decompression position  [employer] press 3x10    Supine marches 3x10  Hip adduction ball squeezes 2x15 Supine Clam shells 2x12 GTB  Standing weight shifts on airex pad x20 Semi-tandem stance on airex pad bil 2x30"  LAQ 3x10    11/6 Nu-step 5 min  Ball roll out fwd x10  Ball roll out lateral x10 each direction  Ball press x20   Decompression position  Ball press 3x10  Double knee to chest 3x10   Supine march 3x10         10/30 Manual: trigger point release to gluteal and lower lumbar spine. Trigger point release to hips and spine; LAD to left left. Trigger point release to peroneals on the right side.   Supine march 2x15  Supine hip abduction 2x15 red  Seated LAQ 3x15 red   Standing march 2x10  Standing weight shift 3x15   Nu-step 6 min L3      10/16 Manual: trigger point release to gluteal and lower lumbar spine. Trigger point release to hips and spine; LAD to left left. Trigger point release to peroneals on the right side.   Seated shoulder press 2x15 2lb  Seated punch 2x15 2lbs  Bicep curl 2x20 3 lbs   Completed strength and gait testing. Reviewed goals as it pertains to each.     10/9 Trigger Point Dry-Needling  Treatment instructions: Expect mild to moderate muscle soreness. S/S of pneumothorax if dry needled over a lung field, and to seek immediate medical attention should they occur. Patient verbalized understanding of these instructions and  education.  Patient Consent Given: Yes Education handout provided: Yes Muscles treated: Right gluteal 3 spots using 0.30 X75 needle left gluteal 2 spots using a 0.30 x 75 needle Treatment response/outcome: Good twitch response; immediate pain relief.  Manual: trigger point release to gluteal and lower lumbar spine. Trigger point release to hips and spine; LAD to left left. Trigger point release toperoneals on the right side.   Seated: March green 2x15  Seated hip abdcution 2x15 green  Seated LAQ 2x15 each leg green      PATIENT EDUCATION:  Education details: reviewed HEP, symptom management  Person educated: Patient Education method: Explanation, Demonstration, Tactile cues, Verbal cues, and Handouts Education comprehension: verbalized understanding, returned demonstration, verbal cues required, tactile cues required, and needs further education  HOME EXERCISE PROGRAM: Has previous HEP. Will review next visit  ASSESSMENT:  CLINICAL IMPRESSION: Therapy continues to focus on strengthening. We reviewed UE/postural/core strengthening. She tolerated well. We were able to advance her bands. As long as she remains out of pain, we will continue to progress strengthening.     The patient tolerated treatment well.  We were able to focus more on exercise today.  She was advised moderate shots are working to try the exercises much as possible without exacerbating her pain.  We worked on decompression position today.  We also work on stretching the lower lumbar spine.  Therapy will continue to progress as tolerated.  OBJECTIVE  IMPAIRMENTS: Abnormal gait, decreased activity tolerance, decreased balance, decreased endurance, decreased knowledge of use of DME, decreased mobility, difficulty walking, decreased ROM, decreased strength, increased muscle spasms, and pain.   ACTIVITY LIMITATIONS: carrying, lifting, bending, standing, squatting, sleeping, stairs, transfers, and locomotion  level  PARTICIPATION LIMITATIONS: meal prep, cleaning, laundry, driving, shopping, community activity, and yard work  PERSONAL FACTORS: Right Hip replacement 2022, Low back Pain, MS, TIA October 2023; MI, November 2022; CAD, Sjorgens syndrome are also affecting patient's functional outcome. MS  REHAB POTENTIAL: Good  CLINICAL DECISION MAKING: Evolving/moderate complexity declining mobility   EVALUATION COMPLEXITY: Moderate   GOALS: Goals reviewed with patient? No  SHORT TERM GOALS: Target date: 09/29/2022    Patient will increase gross bilateral lower extremity strength by 5 pounds Baseline: Goal status: has returned to baseline from a few progress notes ago 10/16  2.  Patient will increase lumbar flexion by 10 degrees Baseline:  Goal status:  mild improvement but still painful 10/16  3.  Patient will be independent with basic after exercise program and stretching program Baseline:  Goal status: has basic HEP achieved 10/16   LONG TERM GOALS: Target date: 09/29/2022     Patient will stand for greater than 30 minutes without increased pain Baseline:  Goal status: improving 5/15  continued improvement 10/16   2.  Patient will pick item up off the floor without increased low back pain Baseline:  Goal status:still having pain 5/15 improved but still painful 10/16   3.  Patient will ambulate community distances without report of increased low back pain Baseline:  Goal status:limited by pain but maintaining 7/25  4.  Patient will report improved ability to sleep through the night secondary to back pain Baseline:  Goal status: continues to have pain at night 7/25    PLAN:  PT FREQUENCY: 1x/week  PT DURATION: 10 weeks   PLANNED INTERVENTIONS: Therapeutic exercises, Therapeutic activity, Neuromuscular re-education, Balance training, Gait training, Patient/Family education, Self Care, Joint mobilization, Stair training, DME instructions, Aquatic Therapy, Dry Needling,  Spinal mobilization, Cryotherapy, Moist heat, Ionotophoresis 4mg /ml Dexamethasone, and Manual therapy.  PLAN FOR NEXT SESSION: Consider manual therapy to the back and hips, consider trigger point dry needling to back and hips.  Reviewed basic HEP.  Begin active strengthening of hips and core.  Begin gait training and functional mobility training.  [facility], SPT  This entire session was performed under the direct supervision of a licensed PT.    Dessie Coma, PT 10/28/2022, 2:56 PM

## 2023-06-11 ENCOUNTER — Telehealth: Payer: Self-pay | Admitting: *Deleted

## 2023-06-11 NOTE — Telephone Encounter (Signed)
Gave completed/signed form back to medical records to process for pt. 

## 2023-06-16 NOTE — Telephone Encounter (Signed)
Left message to call back  

## 2023-06-17 ENCOUNTER — Encounter (HOSPITAL_BASED_OUTPATIENT_CLINIC_OR_DEPARTMENT_OTHER): Payer: Self-pay | Admitting: Physical Therapy

## 2023-06-17 ENCOUNTER — Ambulatory Visit (HOSPITAL_BASED_OUTPATIENT_CLINIC_OR_DEPARTMENT_OTHER): Payer: Medicare Other | Admitting: Physical Therapy

## 2023-06-17 DIAGNOSIS — M5459 Other low back pain: Secondary | ICD-10-CM | POA: Diagnosis not present

## 2023-06-17 DIAGNOSIS — R252 Cramp and spasm: Secondary | ICD-10-CM | POA: Diagnosis not present

## 2023-06-17 DIAGNOSIS — M6281 Muscle weakness (generalized): Secondary | ICD-10-CM

## 2023-06-17 DIAGNOSIS — R262 Difficulty in walking, not elsewhere classified: Secondary | ICD-10-CM | POA: Diagnosis not present

## 2023-06-17 NOTE — Therapy (Signed)
OUTPATIENT PHYSICAL THERAPY THORACOLUMBAR Treatment    Patient Name: Dawn [name] MRN: [id number] DOB:04-03-1946, 77 y.o., female Today's Date: 06/20/2023  END OF SESSION:           Past Medical History:  Diagnosis Date   Allergy    CAD (coronary artery disease)    s/p DES to RCA in 06/2021   Dry eyes    Endometrial polyp    Essential hypertension 08/25/2018   GERD (gastroesophageal reflux disease)    History of hiatal hernia    History of kidney stones    Hot flashes, menopausal 10/13/2011   Estradiol started    Hyperlipidemia    Jaundice as teenager   no problems since   Memory loss 09/21/2019     1/2 of feet numb all the time   Multiple sclerosis (HCC) dx 2001   Neuropathy    bilateral feet   Osteoarthritis    Sjogren's syndrome (HCC) dx oct 2021   sore muscvles, dry mouth and eyes   Stroke (HCC)    Vertigo    Vision abnormalities    Past Surgical History:  Procedure Laterality Date   ABDOMINAL HYSTERECTOMY  2002   partial   COLONOSCOPY  01/27/2022   2 day prep   colonscopy  2011   CORONARY STENT INTERVENTION N/A 07/18/2021   Procedure: CORONARY STENT INTERVENTION;  Surgeon: Corky Crafts, MD;  Location: [facility];  Service: Cardiovascular;  Laterality: N/A;   DILATATION & CURETTAGE/HYSTEROSCOPY WITH MYOSURE N/A 06/08/2020   Procedure: DILATATION & CURETTAGE/HYSTEROSCOPY/Polypectomy WITH MYOSURE;  Surgeon: Toy Baker, DO;  Location:  [facility];  Service: Gynecology;  Laterality: N/A;   LEFT HEART CATH AND CORONARY ANGIOGRAPHY N/A 07/18/2021   Procedure: LEFT HEART CATH AND CORONARY ANGIOGRAPHY;  Surgeon: Corky Crafts, MD;  Location: [facility];  Service: Cardiovascular;  Laterality: N/A;   LUMBAR FUSION  2001   TOTAL HIP ARTHROPLASTY Right 01/17/2021   Procedure: TOTAL HIP ARTHROPLASTY ANTERIOR APPROACH;  Surgeon: Samson Frederic, MD;  Location: [facility];  Service: Orthopedics;  Laterality: Right;    UPPER GI ENDOSCOPY  yrs ago   Patient Active Problem List   Diagnosis Date Noted   Rheumatoid arthritis involving right hand, unspecified whether rheumatoid factor present (HCC) 05/22/2023   Atherosclerotic heart disease of native coronary artery with other forms of angina pectoris (HCC) 05/22/2023   Positive colorectal cancer screening using Cologuard test 11/14/2021   Coronary artery disease    Statin intolerance 04/19/2021   Osteoarthritis of right hip 01/17/2021   Status post THR (total hip replacement) 01/17/2021   Chronic bilateral low back pain without sciatica 10/02/2020   Chronic pain syndrome 06/20/2020   Post laminectomy syndrome 06/20/2020   Sjogren's disease (HCC) 05/23/2020   Primary osteoarthritis of both hands 05/23/2020   Primary osteoarthritis of both feet 05/23/2020   Other secondary scoliosis, lumbar region 12/22/2019   Spondylosis without myelopathy or radiculopathy, lumbar region 12/22/2019   Cervical radiculopathy 10/18/2019   Numbness 09/21/2019   Bilateral carpal tunnel syndrome 09/21/2019   High risk medication use 09/21/2019   Memory loss 09/21/2019   Essential hypertension 08/25/2018   Abnormal SPEP 02/19/2018   Hand pain 02/20/2017   Multiple joint pain 02/18/2017   Disturbed cognition 06/25/2016   Sciatica, right side 10/30/2015   Chronic fatigue 08/04/2014   Gait disturbance 08/04/2014   Unspecified visual disturbance 01/31/2013   Transient vision disturbance 01/31/2013   Colon polyps 11/03/2011   POSTHERPETIC NEURALGIA  10/03/2009   DISPLCMT LUMBAR INTERVERT DISC W/O MYELOPATHY 09/05/2009   RENAL CALCULUS, RECURRENT 09/04/2009   Hyperlipidemia 08/31/2009   Multiple sclerosis (HCC) 08/31/2009   ALLERGIC RHINITIS 08/31/2009  Progress Note Reporting Period 7/25 to 04/23/2023  See note below for Objective Data and Assessment of Progress/Goals.     PCP: Iantha Fallen NP  REFERRING PROVIDER: Dr Romero Belling   REFERRING DIAG: Low Back  Pain   Rationale for Evaluation and Treatment: Rehabilitation  THERAPY DIAG:  Difficulty in walking, not elsewhere classified  Cramp and spasm  Other low back pain  Muscle weakness (generalized)  ONSET DATE: About 11 days ago her nerve abiliation wore off.   SUBJECTIVE:                                                                                                                                                                                           SUBJECTIVE STATEMENT: The right hip has started hurting more.  She has been continuing to work on exercises and movement.   [name] Kitchen PERTINENT HISTORY:  Right Hip replacement 2022, Low back Pain, MS, TIA October 2023; MI, November 2022; CAD, Sjorgens syndrome  PAIN:  Are you having pain? Yes: NPRS scale: 4/10 Pain location:umbar spine Pain description: aching  Aggravating factors: standing and walking  Relieving factors: rest    PRECAUTIONS: None  WEIGHT BEARING RESTRICTIONS: No  FALLS:  Has patient fallen in last 6 months? 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   LUMBAR ROM:   AROM eval 5/15 10/3  Flexion Limited 50 % with pain  Mild improvement but still about 50%  Improved pain but still limited  Extension No limit     Right lateral flexion      Left lateral flexion     Right rotation Limited 50% with pain     Left rotation Limited 50% with pain      (Blank rows = not tested)  LOWER EXTREMITY ROM:  Passive  Right eval Left eval Right  Right  7/24  Hip flexion 105 90 105 110 with pain at end range   Hip extension   10 degrees from neutral before manual today. Too neutral after stretching    Hip abduction      Hip adduction      Hip internal rotation painful Painful  Painful  Full with less pain   Hip external rotation   20 degrees with pain  45 with pain   Knee flexion      Knee extension      Ankle dorsiflexion      Ankle plantarflexion      Ankle inversion      Ankle eversion       (Blank rows = not tested)  LOWER EXTREMITY MMT:    MMT Right eval Left eval Right  3/26 Left  3/26 Right 5/15 Left 5/15 Right 7/24 Left 7/24 Right  10/2 Left 10/2  Hip flexion 13.1 12.3 15.1 26.1 18.2 26.2 11.3 19.1 15.3 23.4  Hip extension            Hip abduction 11.5 10.1 13.7 24.4 15.9 24 12.6 17.8 23.8 28.5  Hip adduction            Hip internal rotation            Hip external rotation            Knee flexion            Knee extension 14.0 13.3 17.5 33.3 20 38.2 19.4 18.3 23.8 23.6  Ankle dorsiflexion            Ankle plantarflexion            Ankle inversion            Ankle eversion             (Blank rows = not tested)  FUNCTIONAL TESTS:  5 times sit to stand: 28 sec  5/15 5 times sit to stand test 20 seconds 7/25 24 sec  10/16 17 sec   3-minute walk test 150 feet without requirement of seated rest break 5/15  7/25 3 min walk test 200'   10/16 3 min walk test 379'   GAIT:  TODAY'S TREATMENT:                                                                                                                              DATE:  11/30 Manual: trigger point release to gluteal and lower lumbar spine. Trigger point release to hips and spine; Roller to anterior hip.   Supine marches 3x10  Hip adduction  ball squeezes 2x15 Supine Clam shells 2x12 GTB  Standing weight shifts on airex pad x20  11/20 Nu-step 5 min L5  Ball roll out fwd x10  Ball roll out lateral x10 each direction  [employer] Green 3x15  Shoulder extension 3x15  green   Bilateral er red 3x15  Horizontal abduction 3x15 red  Bicpes curl 3lbs 3x15 red   All with cuing for abdominal breathing.      11/13 Nu-step 5 min  Ball roll out fwd x10  Ball roll out lateral x10 each direction  [employer] press x20   Decompression position  [employer] press 3x10    Supine marches 3x10  Hip adduction ball squeezes 2x15 Supine Clam shells 2x12 GTB  Standing weight shifts on airex pad x20 Semi-tandem stance on airex pad bil 2x30"  LAQ 3x10    11/6 Nu-step 5 min  Ball roll out fwd x10  Ball roll out lateral x10 each direction  Ball press x20   Decompression position  Ball press 3x10  Double knee to chest 3x10   Supine march 3x10         10/30 Manual: trigger point release to gluteal and lower lumbar spine. Trigger point release to hips and spine; LAD to left left. Trigger point release to peroneals on the right side.   Supine march 2x15  Supine hip abduction 2x15 red  Seated LAQ 3x15 red   Standing march 2x10  Standing weight shift 3x15   Nu-step 6 min L3      10/16 Manual: trigger point release to gluteal and lower lumbar spine. Trigger point release to hips and spine; LAD to left left. Trigger point release to peroneals on the right side.   Seated shoulder press 2x15 2lb  Seated punch 2x15 2lbs  Bicep curl 2x20 3 lbs   Completed strength and gait testing. Reviewed goals as it pertains to each.     10/9 Trigger Point Dry-Needling  Treatment instructions: Expect mild to moderate muscle soreness. S/S of pneumothorax if dry needled over a lung field, and to seek immediate medical attention should they occur. Patient verbalized understanding of these instructions and  education.  Patient Consent Given: Yes Education handout provided: Yes Muscles treated: Right gluteal 3 spots using 0.30 X75 needle left gluteal 2 spots using a 0.30 x 75 needle Treatment response/outcome: Good twitch response; immediate pain relief.  Manual: trigger point release to gluteal and lower lumbar spine. Trigger point release to hips and spine; LAD to left left. Trigger point release toperoneals on the right side.   Seated: March green 2x15  Seated hip abdcution 2x15 green  Seated LAQ 2x15 each leg green      PATIENT EDUCATION:  Education details: reviewed HEP, symptom management  Person educated: Patient Education method: Explanation, Demonstration, Tactile cues, Verbal cues, and Handouts Education comprehension: verbalized understanding, returned demonstration, verbal cues required, tactile cues required, and needs further education  HOME EXERCISE PROGRAM: Has previous HEP. Will review next visit  ASSESSMENT:  CLINICAL IMPRESSION: Worked on manual therapy through right anterior and lateral hip.  She has had increased pain in her right hip over the past week.  She has significant trigger points in her anterior hip.  Continue to work on maintaining strength and improving mobility.  Therapy will continue to progress as tolerated.   The patient tolerated treatment well.  We were able to focus more on exercise today.  She was advised moderate shots are working to try the exercises much as possible without exacerbating her pain.  We worked on decompression position today.  We also work on stretching the lower lumbar spine.  Therapy will continue to progress as tolerated.  OBJECTIVE IMPAIRMENTS: Abnormal gait, decreased activity tolerance, decreased balance, decreased endurance, decreased knowledge of use of  DME, decreased mobility, difficulty walking, decreased ROM, decreased strength, increased muscle spasms, and pain.   ACTIVITY LIMITATIONS: carrying, lifting, bending,  standing, squatting, sleeping, stairs, transfers, and locomotion level  PARTICIPATION LIMITATIONS: meal prep, cleaning, laundry, driving, shopping, community activity, and yard work  PERSONAL FACTORS: Right Hip replacement 2022, Low back Pain, MS, TIA October 2023; MI, November 2022; CAD, Sjorgens syndrome are also affecting patient's functional outcome. MS  REHAB POTENTIAL: Good  CLINICAL DECISION MAKING: Evolving/moderate complexity declining mobility   EVALUATION COMPLEXITY: Moderate   GOALS: Goals reviewed with patient? No  SHORT TERM GOALS: Target date: 09/29/2022    Patient will increase gross bilateral lower extremity strength by 5 pounds Baseline: Goal status: has returned to baseline from a few progress notes ago 10/16  2.  Patient will increase lumbar flexion by 10 degrees Baseline:  Goal status:  mild improvement but still painful 10/16  3.  Patient will be independent with basic after exercise program and stretching program Baseline:  Goal status: has basic HEP achieved 10/16   LONG TERM GOALS: Target date: 09/29/2022     Patient will stand for greater than 30 minutes without increased pain Baseline:  Goal status: improving 5/15  continued improvement 10/16   2.  Patient will pick item up off the floor without increased low back pain Baseline:  Goal status:still having pain 5/15 improved but still painful 10/16   3.  Patient will ambulate community distances without report of increased low back pain Baseline:  Goal status:limited by pain but maintaining 7/25  4.  Patient will report improved ability to sleep through the night secondary to back pain Baseline:  Goal status: continues to have pain at night 7/25    PLAN:  PT FREQUENCY: 1x/week  PT DURATION: 10 weeks   PLANNED INTERVENTIONS: Therapeutic exercises, Therapeutic activity, Neuromuscular re-education, Balance training, Gait training, Patient/Family education, Self Care, Joint mobilization,  Stair training, DME instructions, Aquatic Therapy, Dry Needling, Spinal mobilization, Cryotherapy, Moist heat, Ionotophoresis 4mg /ml Dexamethasone, and Manual therapy.  PLAN FOR NEXT SESSION: Consider manual therapy to the back and hips, consider trigger point dry needling to back and hips.  Reviewed basic HEP.  Begin active strengthening of hips and core.  Begin gait training and functional mobility training.  [facility], SPT  This entire session was performed under the direct supervision of a licensed PT.    Dessie Coma, PT 10/28/2022, 9:05 AM

## 2023-06-19 ENCOUNTER — Other Ambulatory Visit: Payer: Self-pay | Admitting: Student

## 2023-06-19 ENCOUNTER — Other Ambulatory Visit: Payer: Self-pay | Admitting: Family

## 2023-06-20 ENCOUNTER — Encounter (HOSPITAL_BASED_OUTPATIENT_CLINIC_OR_DEPARTMENT_OTHER): Payer: Self-pay | Admitting: Physical Therapy

## 2023-06-22 ENCOUNTER — Other Ambulatory Visit: Payer: Self-pay

## 2023-06-22 MED ORDER — CARVEDILOL 6.25 MG PO TABS
6.2500 mg | ORAL_TABLET | Freq: Two times a day (BID) | ORAL | 3 refills | Status: AC
Start: 2023-06-22 — End: ?

## 2023-06-23 ENCOUNTER — Encounter: Payer: Self-pay | Admitting: Neurology

## 2023-06-23 ENCOUNTER — Telehealth: Payer: Self-pay | Admitting: Cardiovascular Disease

## 2023-06-23 DIAGNOSIS — E785 Hyperlipidemia, unspecified: Secondary | ICD-10-CM

## 2023-06-23 MED ORDER — HYDRALAZINE HCL 25 MG PO TABS: 25.0000 mg | ORAL_TABLET | Freq: Two times a day (BID) | ORAL | 3 refills | Status: DC

## 2023-06-23 NOTE — Telephone Encounter (Signed)
Pt c/o medication issue:  1. Name of Medication: Evolocumab (REPATHA SURECLICK) 140 MG/ML SOAJ   2. How are you currently taking this medication (dosage and times per day)? Inject 140 mg into the skin every 14 (fourteen) days.   3. Are you having a reaction (difficulty breathing--STAT)? No   4. What is your medication issue? Patient is having allergic reaction to medication like itchy skin and breaking out

## 2023-06-23 NOTE — Telephone Encounter (Signed)
*  STAT* If patient is at the pharmacy, call can be transferred to refill team.   1. Which medications need to be refilled? (please list name of each medication and dose if known) hydrALAZINE (APRESOLINE) 25 MG tablet    2. Would you like to learn more about the convenience, safety, & potential cost savings by using the [facility]?     3. Are you open to using the [facility] (Type [facility]. ).   4. Which pharmacy/location (including street and city if local pharmacy) is medication to be sent to? [id number] - [place], [place] - [address] [facility] [address]    5. Do they need a 30 day or 90 day supply? 90

## 2023-06-23 NOTE — Telephone Encounter (Signed)
Reviewed pt chart. She has tried/failed: Tamsulosin, Vesicare and had SE from Myrbetriq (rash).  She has seen [facility] in the past per notes.

## 2023-06-23 NOTE — Telephone Encounter (Signed)
Rx refill sent to pharmacy. 

## 2023-06-23 NOTE — Telephone Encounter (Signed)
Has taken Repatha for 2 months  Getting little patches of rashes all over, getting dizzy and sleepy  Would like to change to another medication  Does not want a statin and please keep price in mind, Repatha expensive for her each month   Will forward to Pharm D for review

## 2023-06-24 ENCOUNTER — Encounter (HOSPITAL_BASED_OUTPATIENT_CLINIC_OR_DEPARTMENT_OTHER): Payer: Self-pay | Admitting: Physical Therapy

## 2023-06-24 ENCOUNTER — Ambulatory Visit (HOSPITAL_BASED_OUTPATIENT_CLINIC_OR_DEPARTMENT_OTHER): Payer: Medicare Other | Attending: Physical Medicine and Rehabilitation | Admitting: Physical Therapy

## 2023-06-24 DIAGNOSIS — R252 Cramp and spasm: Secondary | ICD-10-CM | POA: Insufficient documentation

## 2023-06-24 DIAGNOSIS — M6281 Muscle weakness (generalized): Secondary | ICD-10-CM | POA: Insufficient documentation

## 2023-06-24 DIAGNOSIS — R262 Difficulty in walking, not elsewhere classified: Secondary | ICD-10-CM | POA: Insufficient documentation

## 2023-06-24 DIAGNOSIS — M5459 Other low back pain: Secondary | ICD-10-CM | POA: Diagnosis not present

## 2023-06-24 NOTE — Telephone Encounter (Signed)
Can we place a referral and have her come in to see pharmd to discuss options?

## 2023-06-24 NOTE — Telephone Encounter (Signed)
Per Dr.[name] "  We can try Gemtesa 75 mg --- similar to Myrbetriq but hopefully will not cause s.e.    #30 , 11 refill "

## 2023-06-24 NOTE — Telephone Encounter (Signed)
Advised patient and scheduled  

## 2023-06-24 NOTE — Therapy (Signed)
OUTPATIENT PHYSICAL THERAPY THORACOLUMBAR Treatment    Patient Name: Dawn [name] MRN: [id number] DOB:November 14, 1945, 77 y.o., female Today's Date: 06/24/2023  END OF SESSION:  PT End of Session - 06/24/23 1107     Visit Number 39    Number of Visits 43    Date for PT Re-Evaluation 07/15/23    PT Start Time 1015    PT Stop Time 1058    PT Time Calculation (min) 43 min    Activity Tolerance Patient tolerated treatment well    Behavior During Therapy [facility] for tasks assessed/performed                     Past Medical History:  Diagnosis Date   Allergy    CAD (coronary artery disease)    s/p DES to RCA in 06/2021   Dry eyes    Endometrial polyp    Essential hypertension 08/25/2018   GERD (gastroesophageal reflux disease)    History of hiatal hernia    History of kidney stones    Hot flashes, menopausal 10/13/2011   Estradiol started    Hyperlipidemia    Jaundice as teenager   no problems since   Memory loss 09/21/2019     1/2 of feet numb all the time   Multiple sclerosis (HCC) dx 2001   Neuropathy    bilateral feet   Osteoarthritis    Sjogren's syndrome (HCC) dx oct 2021   sore muscvles, dry mouth and eyes   Stroke (HCC)    Vertigo    Vision abnormalities    Past Surgical History:  Procedure Laterality Date   ABDOMINAL HYSTERECTOMY  2002   partial   COLONOSCOPY  01/27/2022   2 day prep   colonscopy  2011   CORONARY STENT INTERVENTION N/A 07/18/2021   Procedure: CORONARY STENT INTERVENTION;  Surgeon: Corky Crafts, MD;  Location: [facility];  Service: Cardiovascular;  Laterality: N/A;   DILATATION & CURETTAGE/HYSTEROSCOPY WITH MYOSURE N/A 06/08/2020   Procedure: DILATATION & CURETTAGE/HYSTEROSCOPY/Polypectomy WITH MYOSURE;  Surgeon: Toy Baker, DO;  Location: [facility];  Service: Gynecology;  Laterality: N/A;   LEFT HEART CATH AND CORONARY ANGIOGRAPHY N/A 07/18/2021   Procedure: LEFT HEART CATH AND CORONARY  ANGIOGRAPHY;  Surgeon: Corky Crafts, MD;  Location: [facility];  Service: Cardiovascular;  Laterality: N/A;   LUMBAR FUSION  2001   TOTAL HIP ARTHROPLASTY Right 01/17/2021   Procedure: TOTAL HIP ARTHROPLASTY ANTERIOR APPROACH;  Surgeon: Samson Frederic, MD;  Location: [facility];  Service: Orthopedics;  Laterality: Right;   UPPER GI ENDOSCOPY  yrs ago   Patient Active Problem List   Diagnosis Date Noted   Rheumatoid arthritis involving right hand, unspecified whether rheumatoid factor present (HCC) 05/22/2023   Atherosclerotic heart disease of native coronary artery with other forms of angina pectoris (HCC) 05/22/2023   Positive colorectal cancer screening using Cologuard test 11/14/2021   Coronary artery disease    Statin intolerance 04/19/2021   Osteoarthritis of right hip 01/17/2021   Status post THR (total hip replacement) 01/17/2021   Chronic bilateral low back pain without sciatica 10/02/2020   Chronic pain syndrome 06/20/2020   Post laminectomy syndrome 06/20/2020   Sjogren's disease (HCC) 05/23/2020   Primary osteoarthritis of both hands 05/23/2020   Primary osteoarthritis of both feet 05/23/2020   Other secondary scoliosis, lumbar region 12/22/2019   Spondylosis without myelopathy or radiculopathy, lumbar region 12/22/2019   Cervical radiculopathy 10/18/2019   Numbness 09/21/2019  Bilateral carpal tunnel syndrome 09/21/2019   High risk medication use 09/21/2019   Memory loss 09/21/2019   Essential hypertension 08/25/2018   Abnormal SPEP 02/19/2018   Hand pain 02/20/2017   Multiple joint pain 02/18/2017   Disturbed cognition 06/25/2016   Sciatica, right side 10/30/2015   Chronic fatigue 08/04/2014   Gait disturbance 08/04/2014   Unspecified visual disturbance 01/31/2013   Transient vision disturbance 01/31/2013   Colon polyps 11/03/2011   POSTHERPETIC NEURALGIA 10/03/2009   DISPLCMT LUMBAR INTERVERT DISC W/O MYELOPATHY 09/05/2009   RENAL CALCULUS,  RECURRENT 09/04/2009   Hyperlipidemia 08/31/2009   Multiple sclerosis (HCC) 08/31/2009   ALLERGIC RHINITIS 08/31/2009  Progress Note Reporting Period 7/25 to 04/23/2023  See note below for Objective Data and Assessment of Progress/Goals.     PCP: Iantha Fallen NP  REFERRING PROVIDER: Dr Romero Belling   REFERRING DIAG: Low Back Pain   Rationale for Evaluation and Treatment: Rehabilitation  THERAPY DIAG:  No diagnosis found.  ONSET DATE: About 11 days ago her nerve abiliation wore off.   SUBJECTIVE:                                                                                                                                                                                           SUBJECTIVE STATEMENT: The right hip has improved slightly.  She has been working hard on her exercises and try to walk and move more  . PERTINENT HISTORY:  Right Hip replacement 2022, Low back Pain, MS, TIA October 2023; MI, November 2022; CAD, Sjorgens syndrome  PAIN:  Are you having pain? Yes: NPRS scale: 4/10 Pain location:umbar spine Pain description: aching  Aggravating factors: standing and walking  Relieving factors: rest    PRECAUTIONS: None  WEIGHT BEARING RESTRICTIONS: No  FALLS:  Has patient fallen in last 6 months? 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   LUMBAR ROM:   AROM eval 5/15 10/3  Flexion Limited 50 %  with pain  Mild improvement but still about 50%  Improved pain but still limited  Extension No limit     Right lateral flexion     Left lateral flexion     Right rotation Limited 50% with pain     Left rotation Limited 50% with pain      (Blank rows = not tested)  LOWER EXTREMITY ROM:     Passive  Right eval Left eval Right  Right  7/24  Hip flexion 105 90 105 110 with pain at end range   Hip extension   10 degrees from neutral before manual today. Too neutral after stretching    Hip abduction      Hip adduction      Hip internal rotation painful Painful  Painful  Full with less pain   Hip external rotation   20 degrees with pain  45 with pain   Knee flexion      Knee extension      Ankle dorsiflexion      Ankle plantarflexion      Ankle inversion      Ankle eversion       (Blank rows = not tested)  LOWER EXTREMITY MMT:    MMT Right eval Left eval Right  3/26 Left  3/26 Right 5/15 Left 5/15 Right 7/24 Left 7/24 Right  10/2 Left 10/2  Hip flexion 13.1 12.3 15.1 26.1 18.2 26.2 11.3 19.1 15.3 23.4  Hip extension            Hip abduction 11.5 10.1 13.7 24.4 15.9 24 12.6 17.8 23.8 28.5  Hip adduction            Hip internal rotation            Hip external rotation            Knee flexion            Knee extension 14.0 13.3 17.5 33.3 20 38.2 19.4 18.3 23.8 23.6  Ankle dorsiflexion            Ankle plantarflexion            Ankle inversion            Ankle eversion             (Blank rows = not tested)  FUNCTIONAL TESTS:  5 times sit to stand: 28 sec  5/15 5 times sit to stand test 20 seconds 7/25 24 sec  10/16 17 sec   3-minute walk test 150 feet without requirement of seated rest break 5/15  7/25 3 min walk test 200'   10/16 3 min walk test 379'   GAIT:  TODAY'S TREATMENT:                                                                                                                               DATE:  12/4 Manual: trigger point release to gluteal and  lower lumbar spine. Trigger point release to hips and spine; Roller to anterior hip.    Supine marches 3x10  Hip adduction ball squeezes 2x15 Supine Clam shells 2x12 GTB  Seated LAQ red 2x15 each leg  Standing weight shifts x20 Standing low march 2x10    11/30 Manual: trigger point release to gluteal and lower lumbar spine. Trigger point release to hips and spine; Roller to anterior hip.   Supine marches 3x10  Hip adduction ball squeezes 2x15 Supine Clam shells 2x12 GTB  Standing weight shifts on airex pad x20  11/20 Nu-step 5 min L5  Ball roll out fwd x10  Ball roll out lateral x10 each direction  [employer] Green 3x15  Shoulder extension 3x15 green   Bilateral er red 3x15  Horizontal abduction 3x15 red  Bicpes curl 3lbs 3x15 red   All with cuing for abdominal breathing.         PATIENT EDUCATION:  Education details: reviewed HEP, symptom management  Person educated: Patient Education method: Explanation, Demonstration, Tactile cues, Verbal cues, and Handouts Education comprehension: verbalized understanding, returned demonstration, verbal cues required, tactile cues required, and needs further education  HOME EXERCISE PROGRAM: Has previous HEP. Will review next visit  ASSESSMENT:  CLINICAL IMPRESSION: The patients right hip has improved. She had less significant trigger points in her hip. We reviewed seated and supine exercises for home. She has been working hard on her exercises at home. We reviewed her hip motion otday. She is still limited in ER but it has improved sice last visit. Hip ER measured at 25 degrees today.   The patient tolerated treatment well.  We were able to focus more on exercise today.  She was advised moderate shots are working to try the exercises much as possible without exacerbating her pain.  We worked on  decompression position today.  We also work on stretching the lower lumbar spine.  Therapy will continue to progress as tolerated.  OBJECTIVE IMPAIRMENTS: Abnormal gait, decreased activity tolerance, decreased balance, decreased endurance, decreased knowledge of use of DME, decreased mobility, difficulty walking, decreased ROM, decreased strength, increased muscle spasms, and pain.   ACTIVITY LIMITATIONS: carrying, lifting, bending, standing, squatting, sleeping, stairs, transfers, and locomotion level  PARTICIPATION LIMITATIONS: meal prep, cleaning, laundry, driving, shopping, community activity, and yard work  PERSONAL FACTORS: Right Hip replacement 2022, Low back Pain, MS, TIA October 2023; MI, November 2022; CAD, Sjorgens syndrome are also affecting patient's functional outcome. MS  REHAB POTENTIAL: Good  CLINICAL DECISION MAKING: Evolving/moderate complexity declining mobility   EVALUATION COMPLEXITY: Moderate   GOALS: Goals reviewed with patient? No  SHORT TERM GOALS: Target date: 09/29/2022    Patient will increase gross bilateral lower extremity strength by 5 pounds Baseline: Goal status: has returned to baseline from a few progress notes ago 10/16  2.  Patient will increase lumbar flexion by 10 degrees Baseline:  Goal status:  mild improvement but still painful 10/16  3.  Patient will be independent with basic after exercise program and stretching program Baseline:  Goal status: has basic HEP achieved 10/16   LONG TERM GOALS: Target date: 09/29/2022     Patient will stand for greater than 30 minutes without increased pain Baseline:  Goal status: improving 5/15  continued improvement 10/16   2.  Patient will pick item up off the floor without increased low back pain Baseline:  Goal status:still having pain 5/15 improved but still painful 10/16   3.  Patient will  ambulate community distances without report of increased low back pain Baseline:  Goal  status:limited by pain but maintaining 7/25  4.  Patient will report improved ability to sleep through the night secondary to back pain Baseline:  Goal status: continues to have pain at night 7/25    PLAN:  PT FREQUENCY: 1x/week  PT DURATION: 10 weeks   PLANNED INTERVENTIONS: Therapeutic exercises, Therapeutic activity, Neuromuscular re-education, Balance training, Gait training, Patient/Family education, Self Care, Joint mobilization, Stair training, DME instructions, Aquatic Therapy, Dry Needling, Spinal mobilization, Cryotherapy, Moist heat, Ionotophoresis 4mg /ml Dexamethasone, and Manual therapy.  PLAN FOR NEXT SESSION: Consider manual therapy to the back and hips, consider trigger point dry needling to back and hips.  Reviewed basic HEP.  Begin active strengthening of hips and core.  Begin gait training and functional mobility training.  [facility], SPT  This entire session was performed under the direct supervision of a licensed PT.    Dessie Coma, PT 10/28/2022, 11:08 AM

## 2023-06-25 MED ORDER — GEMTESA 75 MG PO TABS: 75.0000 mg | ORAL_TABLET | Freq: Every day | ORAL | 11 refills | Status: DC

## 2023-06-26 ENCOUNTER — Ambulatory Visit: Payer: Medicare Other | Attending: Cardiology | Admitting: Pharmacist

## 2023-06-26 ENCOUNTER — Encounter: Payer: Self-pay | Admitting: Neurology

## 2023-06-26 DIAGNOSIS — T466X5A Adverse effect of antihyperlipidemic and antiarteriosclerotic drugs, initial encounter: Secondary | ICD-10-CM | POA: Diagnosis not present

## 2023-06-26 DIAGNOSIS — I2510 Atherosclerotic heart disease of native coronary artery without angina pectoris: Secondary | ICD-10-CM | POA: Diagnosis not present

## 2023-06-26 DIAGNOSIS — E785 Hyperlipidemia, unspecified: Secondary | ICD-10-CM | POA: Insufficient documentation

## 2023-06-26 DIAGNOSIS — G720 Drug-induced myopathy: Secondary | ICD-10-CM | POA: Insufficient documentation

## 2023-06-26 DIAGNOSIS — T466X5D Adverse effect of antihyperlipidemic and antiarteriosclerotic drugs, subsequent encounter: Secondary | ICD-10-CM | POA: Diagnosis not present

## 2023-06-26 DIAGNOSIS — Z8673 Personal history of transient ischemic attack (TIA), and cerebral infarction without residual deficits: Secondary | ICD-10-CM | POA: Insufficient documentation

## 2023-06-26 MED ORDER — EZETIMIBE 10 MG PO TABS: 10.0000 mg | ORAL_TABLET | Freq: Every day | ORAL | 1 refills | Status: DC

## 2023-06-26 NOTE — Patient Instructions (Signed)
It was nice meeting you today  The medications we discussed today are called Leqvio and Zetia  We can start the Zetia 10mg  once a day.  Focus on continuing your exercise and healthy eating.   Focus on vegetables, fruits, whole grains, healthy fats, and lean proteins  Please let us know if you have any questions  Laural Golden, PharmD, BCACP, CDCES, CPP [address] [place], [place], [address] Phone: [phone], Fax: [phone]

## 2023-06-26 NOTE — Progress Notes (Signed)
Patient ID: Dawn [name]                 DOB: [birth year]                    MRN: [id number]     HPI: Dawn [name] is a 77 y.o. female patient referred to lipid clinic by Dr Duke Salvia. PMH is significant for CVA, MS, HTN, CAD, HLD, and statin intolerance.  Patient presents today to discuss lipid management. Was started on Repatha and had four injections. After each injection had a rash and itching.  Has not been able to eat as healthy since she has been having her kitchen remodeled. Uses a walker and had her counters adjusted so she can move her walker around easier and prepare food better. Is wondering if recent LDL increase is due to eating out daily.  Has tried rosuvastatin, atorvastatin, and pravastatin and was intolerant to all.  Patient goes to physical therapy and swims.  Is very concerned regarding new medications and possible side effects due to MS.  Current Medications: N/A  Intolerances:  Rosuvastatin Atorvastatin Pravastatin  Risk Factors:  CAD Angina NSTEMI CVA  LDL goal: <55  Labs: TC 211, Trigs 124, LDL 133, HDL 56   Past Medical History:  Diagnosis Date   Allergy    CAD (coronary artery disease)    s/p DES to RCA in 06/2021   Dry eyes    Endometrial polyp    Essential hypertension 08/25/2018   GERD (gastroesophageal reflux disease)    History of hiatal hernia    History of kidney stones    Hot flashes, menopausal 10/13/2011   Estradiol started    Hyperlipidemia    Jaundice as teenager   no problems since   Memory loss 09/21/2019     1/2 of feet numb all the time   Multiple sclerosis (HCC) dx 2001   Neuropathy    bilateral feet   Osteoarthritis    Sjogren's syndrome (HCC) dx oct 2021   sore muscvles, dry mouth and eyes   Stroke (HCC)    Vertigo    Vision abnormalities     Current Outpatient Medications on File Prior to Visit  Medication Sig Dispense Refill   acetaminophen (TYLENOL) 500 MG tablet Take 1,000 mg by mouth every 6 (six)  hours as needed for mild pain or moderate pain.     amoxicillin (AMOXIL) 500 MG tablet Take 4 tablets by mouth 1-2 hrs before dental work 20 tablet 2   aspirin EC 81 MG tablet Take 81 mg by mouth daily. Swallow whole.     carvedilol (COREG) 6.25 MG tablet Take 1 tablet (6.25 mg total) by mouth 2 (two) times daily with a meal. 180 tablet 3   cholecalciferol (VITAMIN D) 25 MCG (1000 UNIT) tablet Take 2,000 Units by mouth daily.     clonazePAM (KLONOPIN) 0.5 MG tablet Take 1 tablet (0.5 mg total) by mouth at bedtime. 90 tablet 1   Coenzyme Q10 (COQ-10) 100 MG CAPS Take 100 mg by mouth daily.     diclofenac Sodium (VOLTAREN) 1 % GEL Apply 2 g topically 4 (four) times daily. 50 g 1   DULoxetine (CYMBALTA) 30 MG capsule Take 1 capsule (30 mg total) by mouth daily. (Patient not taking: Reported on 04/27/2023) 90 capsule 0   Evolocumab (REPATHA SURECLICK) 140 MG/ML SOAJ Inject 140 mg into the skin every 14 (fourteen) days. 6 mL 3   famotidine (PEPCID) 20 MG tablet  Take 20 mg by mouth 2 (two) times daily as needed for heartburn or indigestion.     fexofenadine (ALLEGRA) 180 MG tablet Take 180 mg by mouth at bedtime.      Flaxseed, Linseed, (FLAXSEED OIL PO) Take 5-10 mLs by mouth 3 (three) times a week. Power     fluticasone (FLONASE) 50 MCG/ACT nasal spray Place 1 spray into both nostrils daily as needed for allergies or rhinitis.     hydrALAZINE (APRESOLINE) 25 MG tablet Take 1 tablet (25 mg total) by mouth in the morning and at bedtime. 180 tablet 3   Lidocaine 4 % PTCH Place 1 patch onto the skin daily as needed (mild pain). Remove & Discard patch within 12 hours or as directed by MD     losartan (COZAAR) 100 MG tablet TAKE 1 TABLET(100 MG) BY MOUTH DAILY 90 tablet 3   melatonin 5 MG TABS Take 5 mg by mouth at bedtime as needed (Sleep).     modafinil (PROVIGIL) 200 MG tablet One po qAM and one po qNoon 180 tablet 1   nitroGLYCERIN (NITROSTAT) 0.4 MG SL tablet Place 1 tablet (0.4 mg total) under the  tongue every 5 (five) minutes as needed for chest pain. 30 tablet 0   Omega-3 Fatty Acids (OMEGA 3 500 PO) Take 500 mg by mouth daily.     pantoprazole (PROTONIX) 40 MG tablet TAKE 1 TABLET(40 MG) BY MOUTH TWICE DAILY 180 tablet 0   Vibegron (GEMTESA) 75 MG TABS Take 1 tablet (75 mg total) by mouth daily. 30 tablet 11   No current facility-administered medications on file prior to visit.    Allergies  Allergen Reactions   Crestor [Rosuvastatin]     MYALGIA    Pravastatin    Statins Other (See Comments)    Muscle pain; Tolerates Crestor     Assessment/Plan:  1. Hyperlipidemia - Patient last LDL 133 which is above goal of <55. Intolerant to statins and PCSK9i. Advised of other options such as Leqvio or Zetia with the caveat that Zetia will likely not reach her to goal. However, since she has frequent drug side effects, is nervous about injections that may have a long duration of action. Would rather try Zetia since she can discontinue it if she feels poorly.   Start Zetia 10mg  daily Recheck lipid panel and LFTS in 2-3 months  Laural Golden, PharmD, BCACP, CDCES, CPP [address] [place], [place], [address] Phone: [phone], Fax: [phone]

## 2023-06-29 ENCOUNTER — Other Ambulatory Visit: Payer: Self-pay | Admitting: Neurology

## 2023-06-29 ENCOUNTER — Encounter: Payer: Self-pay | Admitting: Neurology

## 2023-06-29 ENCOUNTER — Telehealth: Payer: Self-pay

## 2023-06-29 MED ORDER — SOLIFENACIN SUCCINATE 10 MG PO TABS: 10.0000 mg | ORAL_TABLET | Freq: Every day | ORAL | 5 refills | Status: DC

## 2023-06-29 NOTE — Telephone Encounter (Signed)
Pt has called to discuss the high cost of the  Vibegron (GEMTESA) 75 MG TABS. Call routed to POD 1

## 2023-06-29 NOTE — Telephone Encounter (Signed)
Pt called and stated that she called her pharmacy about her Vibegron [name]) 75mg , and they stated that they needed to call us about the medication and then some paperwork needed to be filled out with [place] as well as her and she stated that " She wasn't going to do all of that" Pt stated that medication is $530.00 for 30 day supply. Pt stated that "she wanted something else to take, because she did not feel like going through that process". Told pt we will let Dr. Epimenio Foot know and see what he advise. Pt verbalized understanding.

## 2023-06-30 NOTE — Telephone Encounter (Signed)
Called pt to discuss medication and pt hung up the phone.

## 2023-07-01 ENCOUNTER — Ambulatory Visit (HOSPITAL_BASED_OUTPATIENT_CLINIC_OR_DEPARTMENT_OTHER): Payer: Medicare Other | Admitting: Physical Therapy

## 2023-07-01 ENCOUNTER — Telehealth: Payer: Self-pay | Admitting: Neurology

## 2023-07-01 ENCOUNTER — Encounter (HOSPITAL_BASED_OUTPATIENT_CLINIC_OR_DEPARTMENT_OTHER): Payer: Self-pay | Admitting: Physical Therapy

## 2023-07-01 DIAGNOSIS — R262 Difficulty in walking, not elsewhere classified: Secondary | ICD-10-CM | POA: Diagnosis not present

## 2023-07-01 DIAGNOSIS — M5459 Other low back pain: Secondary | ICD-10-CM

## 2023-07-01 DIAGNOSIS — M6281 Muscle weakness (generalized): Secondary | ICD-10-CM | POA: Diagnosis not present

## 2023-07-01 DIAGNOSIS — R252 Cramp and spasm: Secondary | ICD-10-CM | POA: Diagnosis not present

## 2023-07-01 NOTE — Therapy (Signed)
OUTPATIENT PHYSICAL THERAPY THORACOLUMBAR Treatment    Patient Name: Dawn [name] MRN: [id number] DOB:1946/06/29, 77 y.o., female Today's Date: 07/01/2023  END OF SESSION:  PT End of Session - 07/01/23 1055     Visit Number 40    Number of Visits 43    Authorization Type [employer] note  at 43    PT Start Time 1015    PT Stop Time 1056    PT Time Calculation (min) 41 min    Activity Tolerance Patient tolerated treatment well    Behavior During Therapy [facility] for tasks assessed/performed                     Past Medical History:  Diagnosis Date   Allergy    CAD (coronary artery disease)    s/p DES to RCA in 06/2021   Dry eyes    Endometrial polyp    Essential hypertension 08/25/2018   GERD (gastroesophageal reflux disease)    History of hiatal hernia    History of kidney stones    Hot flashes, menopausal 10/13/2011   Estradiol started    Hyperlipidemia    Jaundice as teenager   no problems since   Memory loss 09/21/2019     1/2 of feet numb all the time   Multiple sclerosis (HCC) dx 2001   Neuropathy    bilateral feet   Osteoarthritis    Sjogren's syndrome (HCC) dx oct 2021   sore muscvles, dry mouth and eyes   Stroke (HCC)    Vertigo    Vision abnormalities    Past Surgical History:  Procedure Laterality Date   ABDOMINAL HYSTERECTOMY  2002   partial   COLONOSCOPY  01/27/2022   2 day prep   colonscopy  2011   CORONARY STENT INTERVENTION N/A 07/18/2021   Procedure: CORONARY STENT INTERVENTION;  Surgeon: Corky Crafts, MD;  Location: [facility];  Service: Cardiovascular;  Laterality: N/A;   DILATATION & CURETTAGE/HYSTEROSCOPY WITH MYOSURE N/A 06/08/2020   Procedure: DILATATION & CURETTAGE/HYSTEROSCOPY/Polypectomy WITH MYOSURE;  Surgeon: Toy Baker, DO;  Location: [facility];  Service: Gynecology;  Laterality: N/A;   LEFT HEART CATH AND CORONARY ANGIOGRAPHY N/A 07/18/2021   Procedure: LEFT HEART CATH AND CORONARY  ANGIOGRAPHY;  Surgeon: Corky Crafts, MD;  [facility];  Service: Cardiovascular;  Laterality: N/A;   LUMBAR FUSION  2001   TOTAL HIP ARTHROPLASTY Right 01/17/2021   Procedure: TOTAL HIP ARTHROPLASTY ANTERIOR APPROACH;  Surgeon: Samson Frederic, MD;  Location: [facility];  Service: Orthopedics;  Laterality: Right;   UPPER GI ENDOSCOPY  yrs ago   Patient Active Problem List   Diagnosis Date Noted   Statin myopathy 06/26/2023   History of CVA (cerebrovascular accident) 06/26/2023   Rheumatoid arthritis involving right hand, unspecified whether rheumatoid factor present (HCC) 05/22/2023   Atherosclerotic heart disease of native coronary artery with other forms of angina pectoris (HCC) 05/22/2023   Positive colorectal cancer screening using Cologuard test 11/14/2021   Coronary artery disease    Statin intolerance 04/19/2021   Osteoarthritis of right hip 01/17/2021   Status post THR (total hip replacement) 01/17/2021   Chronic bilateral low back pain without sciatica 10/02/2020   Chronic pain syndrome 06/20/2020   Post laminectomy syndrome 06/20/2020   Sjogren's disease (HCC) 05/23/2020   Primary osteoarthritis of both hands 05/23/2020   Primary osteoarthritis of both feet 05/23/2020   Sjogren's syndrome (HCC) 05/23/2020   Other secondary scoliosis, lumbar  region 12/22/2019   Spondylosis without myelopathy or radiculopathy, lumbar region 12/22/2019   Cervical radiculopathy 10/18/2019   Numbness 09/21/2019   Bilateral carpal tunnel syndrome 09/21/2019   High risk medication use 09/21/2019   Memory loss 09/21/2019   Essential hypertension 08/25/2018   Abnormal SPEP 02/19/2018   Hand pain 02/20/2017   Multiple joint pain 02/18/2017   Disturbed cognition 06/25/2016   Impaired cognition 06/25/2016   Sciatica, right side 10/30/2015   Chronic fatigue 08/04/2014   Gait disturbance 08/04/2014   Unspecified visual disturbance 01/31/2013   Transient vision disturbance  01/31/2013   Colon polyps 11/03/2011   POSTHERPETIC NEURALGIA 10/03/2009   DISPLCMT LUMBAR INTERVERT DISC W/O MYELOPATHY 09/05/2009   RENAL CALCULUS, RECURRENT 09/04/2009   Hyperlipidemia 08/31/2009   Multiple sclerosis (HCC) 08/31/2009   ALLERGIC RHINITIS 08/31/2009  Progress Note Reporting Period 7/25 to 04/23/2023  See note below for Objective Data and Assessment of Progress/Goals.     PCP: Iantha Fallen NP  REFERRING PROVIDER: Dr Romero Belling   REFERRING DIAG: Low Back Pain   Rationale for Evaluation and Treatment: Rehabilitation  THERAPY DIAG:  Difficulty in walking, not elsewhere classified  Other low back pain  Cramp and spasm  Muscle weakness (generalized)  ONSET DATE: About 11 days ago her nerve abiliation wore off.   SUBJECTIVE:                                                                                                                                                                                           SUBJECTIVE STATEMENT: Patient reports her right hip is feeling better but now she feels like she strained something on the left side of her low back.  Continues to work on trying to be as mobile as possible.  She feels like the weather may be affecting her today. [name] Kitchen PERTINENT HISTORY:  Right Hip replacement 2022, Low back Pain, MS, TIA October 2023; MI, November 2022; CAD, Sjorgens syndrome  PAIN:  Are you having pain? Yes: NPRS scale: 6/10 Pain location left side lumbar spine Pain description: aching  Aggravating factors: standing and walking  Relieving factors: rest    PRECAUTIONS: None  WEIGHT BEARING RESTRICTIONS: No  FALLS:  Has patient fallen in last 6 months? 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   LUMBAR ROM:   AROM eval 5/15 10/3  Flexion Limited 50 % with pain  Mild improvement but still about 50%  Improved pain but still limited  Extension No limit     Right lateral flexion     Left lateral flexion     Right rotation Limited 50% with pain     Left rotation Limited 50% with pain      (Blank rows = not tested)  LOWER EXTREMITY ROM:     Passive  Right eval Left eval Right  Right  7/24  Hip flexion 105 90 105 110 with pain at end range   Hip extension   10 degrees from neutral before manual today. Too neutral after stretching    Hip abduction      Hip adduction      Hip internal rotation painful Painful  Painful  Full with less pain   Hip external rotation   20 degrees with pain  45 with pain   Knee flexion      Knee extension      Ankle dorsiflexion      Ankle plantarflexion      Ankle inversion      Ankle eversion       (Blank rows = not tested)  LOWER EXTREMITY MMT:    MMT Right eval Left eval Right  3/26 Left  3/26 Right 5/15 Left 5/15 Right 7/24 Left 7/24 Right  10/2 Left 10/2  Hip flexion 13.1 12.3 15.1 26.1 18.2 26.2 11.3 19.1 15.3 23.4  Hip extension            Hip abduction 11.5 10.1 13.7 24.4 15.9 24 12.6 17.8 23.8 28.5  Hip adduction            Hip internal rotation            Hip external rotation            Knee flexion            Knee extension 14.0 13.3 17.5 33.3 20 38.2 19.4 18.3 23.8 23.6  Ankle dorsiflexion            Ankle plantarflexion            Ankle inversion            Ankle eversion              (Blank rows = not tested)  FUNCTIONAL TESTS:  5 times sit to stand: 28 sec  5/15 5 times sit to stand test 20 seconds 7/25 24 sec  10/16 17 sec   3-minute walk test 150 feet without requirement of seated rest break 5/15  7/25 3 min walk test 200'   10/16 3 min walk test 379'   GAIT:  TODAY'S TREATMENT:  DATE:  12/11  Manual: trigger point release to gluteal and lower lumbar spine with focus on the left side.. Trigger point release to hips and spine; Roller to anterior hip.   Seated march red band 3 x 15 Seated hip adduction with ball 3 x 15 Seated clamshell red 3 x 15 Seated LAQ red band 3 x 12 bilateral.  12/4 Manual: trigger point release to gluteal and lower lumbar spine. Trigger point release to hips and spine; Roller to anterior hip.    Supine marches 3x10  Hip adduction ball squeezes 2x15 Supine Clam shells 2x12 GTB  Seated LAQ red 2x15 each leg  Standing weight shifts x20 Standing low march 2x10    11/30 Manual: trigger point release to gluteal and lower lumbar spine. Trigger point release to hips and spine; Roller to anterior hip.   Supine marches 3x10  Hip adduction ball squeezes 2x15 Supine Clam shells 2x12 GTB  Standing weight shifts on airex pad x20  11/20 Nu-step 5 min L5  Ball roll out fwd x10  Ball roll out lateral x10 each direction  [employer] Green 3x15  Shoulder extension 3x15 green   Bilateral er red 3x15  Horizontal abduction 3x15 red  Bicpes curl 3lbs 3x15 red   All with cuing for abdominal breathing.         PATIENT EDUCATION:  Education details: reviewed HEP, symptom management  Person educated: Patient Education method: Explanation, Demonstration, Tactile cues, Verbal cues, and Handouts Education comprehension: verbalized understanding, returned demonstration, verbal cues  required, tactile cues required, and needs further education  HOME EXERCISE PROGRAM: Has previous HEP. Will review next visit  ASSESSMENT:  CLINICAL IMPRESSION: Therapy focused on manual therapy to the left side of her back today. We continued manual therapy to her right hip but she had less trigger points in her anterior quad today.  She did have significant tightness in her left side of her lower back.  She reported a significant improvement in pain with manual therapy.  Therapy continues to work on gross lower extremity strengthening exercises.  The patient tolerated treatment well.  We were able to focus more on exercise today.  She was advised moderate shots are working to try the exercises much as possible without exacerbating her pain.  We worked on decompression position today.  We also work on stretching the lower lumbar spine.  Therapy will continue to progress as tolerated.  OBJECTIVE IMPAIRMENTS: Abnormal gait, decreased activity tolerance, decreased balance, decreased endurance, decreased knowledge of use of DME, decreased mobility, difficulty walking, decreased ROM, decreased strength, increased muscle spasms, and pain.   ACTIVITY LIMITATIONS: carrying, lifting, bending, standing, squatting, sleeping, stairs, transfers, and locomotion level  PARTICIPATION LIMITATIONS: meal prep, cleaning, laundry, driving, shopping, community activity, and yard work  PERSONAL FACTORS: Right Hip replacement 2022, Low back Pain, MS, TIA October 2023; MI, November 2022; CAD, Sjorgens syndrome are also affecting patient's functional outcome. MS  REHAB POTENTIAL: Good  CLINICAL DECISION MAKING: Evolving/moderate complexity declining mobility   EVALUATION COMPLEXITY: Moderate   GOALS: Goals reviewed with patient? No  SHORT TERM GOALS: Target date: 09/29/2022    Patient will increase gross bilateral lower extremity strength by 5 pounds Baseline: Goal status: has returned to baseline from a  few progress notes ago 10/16  2.  Patient will increase lumbar flexion by 10 degrees Baseline:  Goal status:  mild improvement but still painful 10/16  3.  Patient will be independent with basic after exercise program  and stretching program Baseline:  Goal status: has basic HEP achieved 10/16   LONG TERM GOALS: Target date: 09/29/2022     Patient will stand for greater than 30 minutes without increased pain Baseline:  Goal status: improving 5/15  continued improvement 10/16   2.  Patient will pick item up off the floor without increased low back pain Baseline:  Goal status:still having pain 5/15 improved but still painful 10/16   3.  Patient will ambulate community distances without report of increased low back pain Baseline:  Goal status:limited by pain but maintaining 7/25  4.  Patient will report improved ability to sleep through the night secondary to back pain Baseline:  Goal status: continues to have pain at night 7/25    PLAN:  PT FREQUENCY: 1x/week  PT DURATION: 10 weeks   PLANNED INTERVENTIONS: Therapeutic exercises, Therapeutic activity, Neuromuscular re-education, Balance training, Gait training, Patient/Family education, Self Care, Joint mobilization, Stair training, DME instructions, Aquatic Therapy, Dry Needling, Spinal mobilization, Cryotherapy, Moist heat, Ionotophoresis 4mg /ml Dexamethasone, and Manual therapy.  PLAN FOR NEXT SESSION: Consider manual therapy to the back and hips, consider trigger point dry needling to back and hips.  Reviewed basic HEP.  Begin active strengthening of hips and core.  Begin gait training and functional mobility training.  [facility], SPT  This entire session was performed under the direct supervision of a licensed PT.    Dessie Coma, PT 10/28/2022, 10:57 AM

## 2023-07-01 NOTE — Telephone Encounter (Signed)
Left message for patient to call.

## 2023-07-01 NOTE — Telephone Encounter (Signed)
Pt has called to report that she is being informed that she will not be able to get the Gemtesa at an affordable rate even with PA. Pt is asking for a call to discuss other medications that are options for her.

## 2023-07-02 ENCOUNTER — Other Ambulatory Visit: Payer: Self-pay | Admitting: Family

## 2023-07-02 ENCOUNTER — Telehealth: Payer: Medicare Other | Admitting: Family

## 2023-07-02 ENCOUNTER — Other Ambulatory Visit: Payer: Self-pay

## 2023-07-02 ENCOUNTER — Telehealth: Payer: Self-pay | Admitting: Neurology

## 2023-07-02 DIAGNOSIS — M549 Dorsalgia, unspecified: Secondary | ICD-10-CM

## 2023-07-02 NOTE — Telephone Encounter (Signed)
Pt has returned call to [facility]

## 2023-07-02 NOTE — Telephone Encounter (Signed)
Left message on voicemail for patient to return call to schedule an appointment to discuss, I emphasized it can be video or in person

## 2023-07-02 NOTE — Telephone Encounter (Signed)
Dr. Epimenio Foot patient said the [name] is too expensive even with PA. She reports trying solefinicin with no relief. Per previous notes she has tried Tamsulosin, Mybetriq ,Tolterodine. I asked would she like referral to urology she declined stating she only seen them one time for kidney stones.   She asked if you know of any other mediation options? If not she will follow up with PCP   Please advise

## 2023-07-02 NOTE — Telephone Encounter (Signed)
Dr.[name] replied "I do not really have any other suggestions for medications as we have tried medications from several different categories.  I be happy to refer her to urology if she wants to see if they might have better suggestions"     Patient informed with above, she declined referral at this time.

## 2023-07-02 NOTE — Telephone Encounter (Signed)
Pt has returned call to [place], [place]

## 2023-07-02 NOTE — Telephone Encounter (Signed)
Patient called requesting a medication for her over-active bladder. She has tried several OTC meds with no relief. She would like a nurse to call her back regarding this [phone]).

## 2023-07-03 ENCOUNTER — Telehealth: Payer: Medicare Other | Admitting: Nurse Practitioner

## 2023-07-03 ENCOUNTER — Encounter: Payer: Self-pay | Admitting: Nurse Practitioner

## 2023-07-03 DIAGNOSIS — E785 Hyperlipidemia, unspecified: Secondary | ICD-10-CM | POA: Diagnosis not present

## 2023-07-03 DIAGNOSIS — E7800 Pure hypercholesterolemia, unspecified: Secondary | ICD-10-CM

## 2023-07-03 DIAGNOSIS — G35 Multiple sclerosis: Secondary | ICD-10-CM | POA: Diagnosis not present

## 2023-07-03 DIAGNOSIS — I10 Essential (primary) hypertension: Secondary | ICD-10-CM | POA: Diagnosis not present

## 2023-07-03 DIAGNOSIS — N3281 Overactive bladder: Secondary | ICD-10-CM | POA: Diagnosis not present

## 2023-07-03 MED ORDER — DARIFENACIN HYDROBROMIDE ER 7.5 MG PO TB24: 7.5000 mg | ORAL_TABLET | Freq: Every day | ORAL | 0 refills | Status: DC

## 2023-07-03 NOTE — Assessment & Plan Note (Signed)
on Ezetimive, LDL 56 04/15/23

## 2023-07-03 NOTE — Progress Notes (Addendum)
Location:  provider @ clinic [facility], the patient @ home   Place of Service:  Video visit  Provider: Arna Snipe Cinderella Christoffersen NP  I connected with Dawn [name] by a video enabled telemedicine application and verified that I am speaking with the correct person using two identifies.  Code Status: DNR Goals of Care:     07/03/2023    1:19 PM  Advanced Directives  Does Patient Have a Medical Advance Directive? Yes  Type of Estate agent of [place];Living will  Does patient want to make changes to medical advance directive? No - Patient declined  Copy of Healthcare Power of Attorney in Chart? Yes - validated most recent copy scanned in chart (See row information)     Chief Complaint  Patient presents with   Acute Visit    Patient is being seen for overactive bladder.     HPI: Patient is a 77 y.o. female seen today for c/o OAB symptoms, denied dysuria,   OAB,  off Vesicare, Tlterodine, Myrbetriq, Tamsulosin, Gemtesa(cost too much). Enablex trial.   MS on Modafinil, TSH 2.85 05/22/23  HLD, on Ezetimive, LDL 56 04/15/23  HTN, on carvedilol, Hydralazine, Losartan  Anxiety, Clonazepam, Duloxetine  GERD, on Famotidine, Pantoprazole, Hgb 13.9 05/22/23  Seasonal allergy, on Allegra, Flonase       Past Medical History:  Diagnosis Date   Allergy    CAD (coronary artery disease)    s/p DES to RCA in 06/2021   Dry eyes    Endometrial polyp    Essential hypertension 08/25/2018   GERD (gastroesophageal reflux disease)    History of hiatal hernia    History of kidney stones    Hot flashes, menopausal 10/13/2011   Estradiol started    Hyperlipidemia    Jaundice as teenager   no problems since   Memory loss 09/21/2019     1/2 of feet numb all the time   Multiple sclerosis (HCC) dx 2001   Neuropathy    bilateral feet   Osteoarthritis    Sjogren's syndrome (HCC) dx oct 2021   sore muscvles, dry mouth and eyes   Stroke (HCC)    Vertigo    Vision abnormalities     Past  Surgical History:  Procedure Laterality Date   ABDOMINAL HYSTERECTOMY  2002   partial   COLONOSCOPY  01/27/2022   2 day prep   colonscopy  2011   CORONARY STENT INTERVENTION N/A 07/18/2021   Procedure: CORONARY STENT INTERVENTION;  Surgeon: Corky Crafts, MD;  Location: [facility];  Service: Cardiovascular;  Laterality: N/A;   DILATATION & CURETTAGE/HYSTEROSCOPY WITH MYOSURE N/A 06/08/2020   Procedure: DILATATION & CURETTAGE/HYSTEROSCOPY/Polypectomy WITH MYOSURE;  Surgeon: Toy Baker, DO;  Location: [facility];  Service: Gynecology;  Laterality: N/A;   LEFT HEART CATH AND CORONARY ANGIOGRAPHY N/A 07/18/2021   Procedure: LEFT HEART CATH AND CORONARY ANGIOGRAPHY;  Surgeon: Corky Crafts, MD;  Location: [facility]VASIVE CV LAB;  Service: Cardiovascular;  Laterality: N/A;   LUMBAR FUSION  2001   TOTAL HIP ARTHROPLASTY Right 01/17/2021   Procedure: TOTAL HIP ARTHROPLASTY ANTERIOR APPROACH;  Surgeon: Samson Frederic, MD;  Location: [facility];  Service: Orthopedics;  Laterality: Right;   UPPER GI ENDOSCOPY  yrs ago    Allergies  Allergen Reactions   Repatha [Evolocumab] Itching   Crestor [Rosuvastatin]     MYALGIA    Pravastatin    Statins Other (See Comments)    Muscle pain; Tolerates Crestor  Allergies as of 07/03/2023       Reactions   Repatha [evolocumab] Itching   Crestor [rosuvastatin]    MYALGIA    Pravastatin    Statins Other (See Comments)   Muscle pain; Tolerates Crestor         Medication List        Accurate as of July 03, 2023 11:59 PM. If you have any questions, ask your nurse or doctor.          acetaminophen 500 MG tablet Commonly known as: TYLENOL Take 1,000 mg by mouth every 6 (six) hours as needed for mild pain or moderate pain.   amoxicillin 500 MG tablet Commonly known as: AMOXIL Take 4 tablets by mouth 1-2 hrs before dental work   aspirin EC 81 MG tablet Take 81 mg by mouth daily. Swallow whole.    carvedilol 6.25 MG tablet Commonly known as: COREG Take 1 tablet (6.25 mg total) by mouth 2 (two) times daily with a meal.   cholecalciferol 25 MCG (1000 UNIT) tablet Commonly known as: VITAMIN D3 Take 2,000 Units by mouth daily.   clonazePAM 0.5 MG tablet Commonly known as: KLONOPIN Take 1 tablet (0.5 mg total) by mouth at bedtime.   CoQ-10 100 MG Caps Take 100 mg by mouth daily.   darifenacin 7.5 MG 24 hr tablet Commonly known as: Enablex Take 1 tablet (7.5 mg total) by mouth daily. Started by: Despina Boan X Isidoro Santillana   diclofenac Sodium 1 % Gel Commonly known as: Voltaren Apply 2 g topically 4 (four) times daily.   DULoxetine 30 MG capsule Commonly known as: CYMBALTA TAKE 1 CAPSULE(30 MG) BY MOUTH DAILY   ezetimibe 10 MG tablet Commonly known as: Zetia Take 1 tablet (10 mg total) by mouth daily.   famotidine 20 MG tablet Commonly known as: PEPCID Take 20 mg by mouth 2 (two) times daily as needed for heartburn or indigestion.   fexofenadine 180 MG tablet Commonly known as: ALLEGRA Take 180 mg by mouth at bedtime.   FLAXSEED OIL PO Take 5-10 mLs by mouth 3 (three) times a week. Power   fluticasone 50 MCG/ACT nasal spray Commonly known as: FLONASE Place 1 spray into both nostrils daily as needed for allergies or rhinitis.   hydrALAZINE 25 MG tablet Commonly known as: APRESOLINE Take 1 tablet (25 mg total) by mouth in the morning and at bedtime.   lidocaine 4 % Place 1 patch onto the skin daily as needed (mild pain). Remove & Discard patch within 12 hours or as directed by MD   losartan 100 MG tablet Commonly known as: COZAAR TAKE 1 TABLET(100 MG) BY MOUTH DAILY   melatonin 5 MG Tabs Take 5 mg by mouth at bedtime as needed (Sleep).   modafinil 200 MG tablet Commonly known as: PROVIGIL One po qAM and one po qNoon   nitroGLYCERIN 0.4 MG SL tablet Commonly known as: NITROSTAT Place 1 tablet (0.4 mg total) under the tongue every 5 (five) minutes as needed for chest  pain.   OMEGA 3 500 PO Take 500 mg by mouth daily.   pantoprazole 40 MG tablet Commonly known as: PROTONIX TAKE 1 TABLET(40 MG) BY MOUTH TWICE DAILY   solifenacin 10 MG tablet Commonly known as: VESICARE Take 1 tablet (10 mg total) by mouth daily.        Review of Systems:  Review of Systems  Constitutional:  Negative for appetite change and fatigue.  HENT:  Negative for congestion and trouble swallowing.  Respiratory:  Negative for cough and wheezing.   Gastrointestinal:  Negative for abdominal pain, constipation, nausea and vomiting.  Genitourinary:  Positive for frequency. Negative for dysuria, hematuria and urgency.  Musculoskeletal:  Positive for arthralgias and myalgias.  Neurological:  Negative for speech difficulty and headaches.  Psychiatric/Behavioral:  Positive for sleep disturbance. Negative for confusion. The patient is not nervous/anxious.        Up to bathroom 2-3x/night    Health Maintenance  Topic Date Due   DTaP/Tdap/Td (1 - Tdap) 12/05/2009   DEXA SCAN  Never done   Medicare Annual Wellness (AWV)  09/06/2021   COVID-19 Vaccine (7 - 2024-25 season) 05/30/2023   Zoster Vaccines- Shingrix (2 of 2) 05/30/2023   INFLUENZA VACCINE  10/19/2023 (Originally 02/19/2023)   Fecal DNA (Cologuard)  11/01/2024   Pneumonia Vaccine 58+ Years old  Completed   Hepatitis C Screening  Completed   HPV VACCINES  Aged Out    Physical Exam: There were no vitals filed for this visit. There is no height or weight on file to calculate BMI. Physical Exam  Labs reviewed: Basic Metabolic Panel: Recent Labs    10/22/22 1018 05/22/23 1059  NA 139  --   K 4.6  --   CL 105  --   CO2 25  --   GLUCOSE 93  --   BUN 26*  --   CREATININE 0.69  --   CALCIUM 9.2  --   TSH 2.41 2.85   Liver Function Tests: Recent Labs    10/22/22 1018 04/15/23 1029  AST 15 15  ALT 17 14  ALKPHOS  --  115  BILITOT 0.3 0.3  PROT 5.9* 6.1  ALBUMIN  --  4.3   No results for input(s):  "LIPASE", "AMYLASE" in the last 8760 hours. No results for input(s): "AMMONIA" in the last 8760 hours. CBC: Recent Labs    10/22/22 1018 05/22/23 1059  WBC 7.6 5.5  NEUTROABS 6,209 3,982  HGB 13.5 13.9  HCT 40.7 42.5  MCV 89.3 93.0  PLT 214 248   Lipid Panel: Recent Labs    10/22/22 1018 04/15/23 1029  CHOL 137 211*  HDL 67 56  LDLCALC 56 133*  TRIG 64 124  CHOLHDL 2.0 3.8   Lab Results  Component Value Date   HGBA1C 5.4 06/15/2021    Procedures since last visit: No results found.  Assessment/Plan  OAB (overactive bladder)  off Vesicare, Tlterodine, Myrbetriq, Tamsulosin, Gemtesa(cost too much).  Enablex trial.   Multiple sclerosis (HCC) on Modafinil, TSH 2.85 05/22/23  Hyperlipidemia on Ezetimive, LDL 56 04/15/23  Essential hypertension on carvedilol, Hydralazine, Losartan   Labs/tests ordered:  none  Next appt:  11/02/2023

## 2023-07-03 NOTE — Assessment & Plan Note (Signed)
on Modafinil, TSH 2.85 05/22/23

## 2023-07-03 NOTE — Assessment & Plan Note (Addendum)
off Vesicare, Tlterodine, Myrbetriq, Tamsulosin, Gemtesa(cost too much).  Enablex trial.

## 2023-07-03 NOTE — Assessment & Plan Note (Signed)
on carvedilol, Hydralazine, Losartan

## 2023-07-06 ENCOUNTER — Other Ambulatory Visit: Payer: Self-pay | Admitting: Nurse Practitioner

## 2023-07-06 ENCOUNTER — Telehealth: Payer: Self-pay | Admitting: *Deleted

## 2023-07-06 ENCOUNTER — Telehealth: Payer: Self-pay | Admitting: Family

## 2023-07-06 ENCOUNTER — Encounter: Payer: Self-pay | Admitting: Nurse Practitioner

## 2023-07-06 ENCOUNTER — Other Ambulatory Visit: Payer: Medicare Other

## 2023-07-06 DIAGNOSIS — N3281 Overactive bladder: Secondary | ICD-10-CM | POA: Diagnosis not present

## 2023-07-06 DIAGNOSIS — R350 Frequency of micturition: Secondary | ICD-10-CM | POA: Diagnosis not present

## 2023-07-06 NOTE — Telephone Encounter (Signed)
Pt parking placard email and copy mailed to pt.

## 2023-07-06 NOTE — Telephone Encounter (Signed)
Patient came into office to leave Urine Specimen.   Patient stated that she called her insurance and they will cover Oxybutynin Chloride for OAB.

## 2023-07-06 NOTE — Telephone Encounter (Signed)
Mast, Man X, NP  You10 minutes ago (2:59 PM)     Would you please placing UA C/S for her since she thinks she Khristi Schiller have UTI first? Can you check with her insurance to see what are meds for OAB covered?  Thank you          Called patient and she agreed. Order placed for U//A and CX.  Patient stated that she will call her insurance to see which OAB medication they will cover.

## 2023-07-06 NOTE — Telephone Encounter (Signed)
Mast, Man X, NP  You19 minutes ago (4:25 PM)    Thank you  Can you send Oxybutynin 5mg  bid for her if she agrees?     Patient Notified and agreed. Pended Rx and sent to [facility] for approval.

## 2023-07-06 NOTE — Telephone Encounter (Signed)
Patient called and stated that she was seen by [facility] 07/03/2023 (video Visit) and prescribed Enablex.   Insurance will not cover Enablex and patient is requesting something different to be sent to Pharmacy, [facility].   Please Advise.

## 2023-07-06 NOTE — Telephone Encounter (Signed)
Pt calling to check status of form because she hasn't heard back or received the form. Transferred to medical records

## 2023-07-06 NOTE — Addendum Note (Signed)
Addended by: Nelda Severe A on: 07/06/2023 04:49 PM   Modules accepted: Orders

## 2023-07-06 NOTE — Addendum Note (Signed)
Addended by: Nelda Severe A on: 07/06/2023 03:19 PM   Modules accepted: Orders

## 2023-07-06 NOTE — Telephone Encounter (Signed)
Patient called to say that the medication Manxie called in for her overactive bladder, her insurance will not cover, so she needs something else sent in. Also, she thinks she now has a UTI.

## 2023-07-07 ENCOUNTER — Other Ambulatory Visit: Payer: Self-pay

## 2023-07-07 MED ORDER — OXYBUTYNIN CHLORIDE 5 MG PO TABS: 5.0000 mg | ORAL_TABLET | Freq: Two times a day (BID) | ORAL | 5 refills | Status: DC

## 2023-07-08 ENCOUNTER — Ambulatory Visit (HOSPITAL_BASED_OUTPATIENT_CLINIC_OR_DEPARTMENT_OTHER): Payer: Medicare Other | Admitting: Physical Therapy

## 2023-07-08 ENCOUNTER — Encounter (HOSPITAL_BASED_OUTPATIENT_CLINIC_OR_DEPARTMENT_OTHER): Payer: Self-pay | Admitting: Physical Therapy

## 2023-07-08 DIAGNOSIS — R262 Difficulty in walking, not elsewhere classified: Secondary | ICD-10-CM

## 2023-07-08 DIAGNOSIS — R252 Cramp and spasm: Secondary | ICD-10-CM

## 2023-07-08 DIAGNOSIS — M6281 Muscle weakness (generalized): Secondary | ICD-10-CM | POA: Diagnosis not present

## 2023-07-08 DIAGNOSIS — M5459 Other low back pain: Secondary | ICD-10-CM

## 2023-07-08 LAB — URINALYSIS, ROUTINE W REFLEX MICROSCOPIC
Bilirubin Urine: NEGATIVE
Glucose, UA: NEGATIVE
Hgb urine dipstick: NEGATIVE
Hyaline Cast: NONE SEEN /LPF
Ketones, ur: NEGATIVE
Nitrite: POSITIVE — AB
Specific Gravity, Urine: 1.017 (ref 1.001–1.035)
WBC, UA: >=60 /[HPF] — AB (ref 0–5)
pH: <=5 (ref 5.0–8.0)

## 2023-07-08 LAB — URINE CULTURE
MICRO NUMBER:: 15855987
SPECIMEN QUALITY:: ADEQUATE

## 2023-07-08 LAB — MICROSCOPIC MESSAGE

## 2023-07-08 NOTE — Therapy (Signed)
OUTPATIENT PHYSICAL THERAPY THORACOLUMBAR Treatment    Patient Name: SWATHI SOCKS MRN: [id number] DOB:[birth year], 77 y.o., female Today's Date: 07/08/2023  END OF SESSION:  PT End of Session - 07/08/23 1017     Visit Number 41    Number of Visits 43    Date for PT Re-Evaluation 07/15/23    Authorization Type [employer] note  at 43    PT Start Time 1015    PT Stop Time 1057    PT Time Calculation (min) 42 min    Activity Tolerance Patient tolerated treatment well    Behavior During Therapy [facility] for tasks assessed/performed                     Past Medical History:  Diagnosis Date   Allergy    CAD (coronary artery disease)    s/p DES to RCA in 06/2021   Dry eyes    Endometrial polyp    Essential hypertension 08/25/2018   GERD (gastroesophageal reflux disease)    History of hiatal hernia    History of kidney stones    Hot flashes, menopausal 10/13/2011   Estradiol started    Hyperlipidemia    Jaundice as teenager   no problems since   Memory loss 09/21/2019     1/2 of feet numb all the time   Multiple sclerosis (HCC) dx 2001   Neuropathy    bilateral feet   Osteoarthritis    Sjogren's syndrome (HCC) dx oct 2021   sore muscvles, dry mouth and eyes   Stroke (HCC)    Vertigo    Vision abnormalities    Past Surgical History:  Procedure Laterality Date   ABDOMINAL HYSTERECTOMY  2002   partial   COLONOSCOPY  01/27/2022   2 day prep   colonscopy  2011   CORONARY STENT INTERVENTION N/A 07/18/2021   Procedure: CORONARY STENT INTERVENTION;  Surgeon: Corky Crafts, MD;  Location: [facility] INVASIVE CV [facility];  Service: Cardiovascular;  Laterality: N/A;   DILATATION & CURETTAGE/HYSTEROSCOPY WITH MYOSURE N/A 06/08/2020   Procedure: DILATATION & CURETTAGE/HYSTEROSCOPY/Polypectomy WITH MYOSURE;  Surgeon: Toy Baker, DO;  Location: [facility];  Service: Gynecology;  Laterality: N/A;   LEFT HEART CATH AND CORONARY ANGIOGRAPHY N/A 07/18/2021    Procedure: LEFT HEART CATH AND CORONARY ANGIOGRAPHY;  Surgeon: Corky Crafts, MD;  Location: [facility] INVASIVE CV LAB;  Service: Cardiovascular;  Laterality: N/A;   LUMBAR FUSION  2001   TOTAL HIP ARTHROPLASTY Right 01/17/2021   Procedure: TOTAL HIP ARTHROPLASTY ANTERIOR APPROACH;  Surgeon: Samson Frederic, MD;  Location: [facility];  Service: Orthopedics;  Laterality: Right;   UPPER GI ENDOSCOPY  yrs ago   Patient Active Problem List   Diagnosis Date Noted   OAB (overactive bladder) 07/03/2023   Statin myopathy 06/26/2023   History of CVA (cerebrovascular accident) 06/26/2023   Rheumatoid arthritis involving right hand, unspecified whether rheumatoid factor present (HCC) 05/22/2023   Atherosclerotic heart disease of native coronary artery with other forms of angina pectoris (HCC) 05/22/2023   Positive colorectal cancer screening using Cologuard test 11/14/2021   Coronary artery disease    Statin intolerance 04/19/2021   Osteoarthritis of right hip 01/17/2021   Status post THR (total hip replacement) 01/17/2021   Chronic bilateral low back pain without sciatica 10/02/2020   Chronic pain syndrome 06/20/2020   Post laminectomy syndrome 06/20/2020   Sjogren's disease (HCC) 05/23/2020   Primary osteoarthritis of both hands 05/23/2020   Primary osteoarthritis of both  feet 05/23/2020   Sjogren's syndrome (HCC) 05/23/2020   Other secondary scoliosis, lumbar region 12/22/2019   Spondylosis without myelopathy or radiculopathy, lumbar region 12/22/2019   Cervical radiculopathy 10/18/2019   Numbness 09/21/2019   Bilateral carpal tunnel syndrome 09/21/2019   High risk medication use 09/21/2019   Memory loss 09/21/2019   Essential hypertension 08/25/2018   Abnormal SPEP 02/19/2018   Hand pain 02/20/2017   Multiple joint pain 02/18/2017   Disturbed cognition 06/25/2016   Impaired cognition 06/25/2016   Sciatica, right side 10/30/2015   Chronic fatigue 08/04/2014   Gait disturbance 08/04/2014    Unspecified visual disturbance 01/31/2013   Transient vision disturbance 01/31/2013   Colon polyps 11/03/2011   POSTHERPETIC NEURALGIA 10/03/2009   DISPLCMT LUMBAR INTERVERT DISC W/O MYELOPATHY 09/05/2009   RENAL CALCULUS, RECURRENT 09/04/2009   Hyperlipidemia 08/31/2009   Multiple sclerosis (HCC) 08/31/2009   ALLERGIC RHINITIS 08/31/2009  Progress Note Reporting Period 7/25 to 04/23/2023  See note below for Objective Data and Assessment of Progress/Goals.     PCP: Iantha Fallen NP  REFERRING PROVIDER: Dr Romero Belling   REFERRING DIAG: Low Back Pain   Rationale for Evaluation and Treatment: Rehabilitation  THERAPY DIAG:  Difficulty in walking, not elsewhere classified  Other low back pain  Cramp and spasm  Muscle weakness (generalized)  ONSET DATE: About 11 days ago her nerve abiliation wore off.   SUBJECTIVE:                                                                                                                                                                                           SUBJECTIVE STATEMENT: Patient has been having some trouble UTIs.  Overall her hip and back are painful but not as bad as last visit.  She continues to work on her exercises at home.[name] Kitchen PERTINENT HISTORY:  Right Hip replacement 2022, Low back Pain, MS, TIA October 2023; MI, November 2022; CAD, Sjorgens syndrome  PAIN:  Are you having pain? Yes: NPRS scale: 6/10 Pain location left side lumbar spine Pain description: aching  Aggravating factors: standing and walking  Relieving factors: rest    PRECAUTIONS: None  WEIGHT BEARING RESTRICTIONS: No  FALLS:  Has patient fallen in last 6 months? 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   LUMBAR ROM:   AROM eval 5/15 10/3  Flexion Limited 50 % with pain  Mild improvement but still about 50%  Improved pain but still limited  Extension No limit     Right lateral flexion     Left lateral flexion     Right rotation Limited 50% with pain     Left rotation Limited 50% with pain      (Blank rows = not tested)  LOWER EXTREMITY ROM:     Passive  Right eval Left eval Right  Right  7/24  Hip flexion 105 90 105 110 with pain at end range   Hip extension   10 degrees from neutral before manual today. Too neutral after stretching    Hip abduction      Hip adduction      Hip internal rotation painful Painful  Painful  Full with less pain   Hip external rotation   20 degrees with pain  45 with pain   Knee flexion      Knee extension      Ankle dorsiflexion      Ankle plantarflexion      Ankle inversion      Ankle eversion       (Blank rows = not tested)  LOWER EXTREMITY MMT:    MMT Right eval Left eval Right  3/26 Left  3/26 Right 5/15 Left 5/15 Right 7/24 Left 7/24 Right  10/2 Left 10/2  Hip flexion 13.1 12.3 15.1 26.1 18.2 26.2 11.3 19.1 15.3 23.4  Hip extension            Hip abduction 11.5 10.1 13.7 24.4 15.9 24 12.6 17.8 23.8 28.5  Hip adduction            Hip internal rotation            Hip external rotation            Knee flexion            Knee extension 14.0 13.3 17.5 33.3 20 38.2 19.4 18.3 23.8 23.6  Ankle dorsiflexion            Ankle plantarflexion            Ankle inversion            Ankle eversion              (Blank rows = not tested)  FUNCTIONAL TESTS:  5 times sit to stand: 28 sec  5/15 5 times sit to stand test 20 seconds 7/25 24 sec  10/16 17 sec   3-minute walk test 150 feet without requirement of seated rest break 5/15  7/25 3 min walk test 200'   10/16 3 min walk test 379'   GAIT:  TODAY'S TREATMENT:  DATE:  12/18  Manual: trigger point release to gluteal and lower lumbar spine with focus on the left side.. Trigger point release to hips and spine; Roller to anterior hip.   Supine marches 3x10  Hip adduction ball squeezes 2x15 Supine Clam shells 2x12 GTB  Seated LAQ red 2x15 each leg  12/11  Manual: trigger point release to gluteal and lower lumbar spine with focus on the left side.. Trigger point release to hips and spine; Roller to anterior hip.   Seated march red band 3 x 15 Seated hip adduction with ball 3 x 15 Seated clamshell red 3 x 15 Seated LAQ red band 3 x 12 bilateral.  12/4 Manual: trigger point release to gluteal and lower lumbar spine. Trigger point release to hips and spine; Roller to anterior hip.    Supine marches 3x10  Hip adduction ball squeezes 2x15 Supine Clam shells 2x12 GTB  Seated LAQ red 2x15 each leg  Standing weight shifts x20 Standing low march 2x10    11/30 Manual: trigger point release to gluteal and lower lumbar spine. Trigger point release to hips and spine; Roller to anterior hip.   Supine marches 3x10  Hip adduction ball squeezes 2x15 Supine Clam shells 2x12 GTB  Standing weight shifts on airex pad x20  11/20 Nu-step 5 min L5  Ball roll out fwd x10  Ball roll out lateral x10 each direction  [employer] Green 3x15  Shoulder extension 3x15 green   Bilateral er red 3x15  Horizontal abduction 3x15 red  Bicpes curl 3lbs 3x15 red   All with cuing for abdominal breathing.          PATIENT EDUCATION:  Education details: reviewed HEP, symptom management  Person educated: Patient Education method: Explanation, Demonstration, Tactile cues, Verbal cues, and Handouts Education comprehension: verbalized understanding, returned demonstration, verbal cues required, tactile cues required, and needs further education  HOME EXERCISE PROGRAM: Has previous HEP. Will review next visit  ASSESSMENT:  CLINICAL IMPRESSION: Overall pain today.  We are able to focus on exercises and less manual therapy today.  She continues to have trigger points in her lumbar spine.  She tolerated TherEX well.  We are able to advance pain with long arc quad to green.  The patient tolerated treatment well.  We were able to focus more on exercise today.  She was advised moderate shots are working to try the exercises much as possible without exacerbating her pain.  We worked on decompression position today.  We also work on stretching the lower lumbar spine.  Therapy will continue to progress as tolerated.  OBJECTIVE IMPAIRMENTS: Abnormal gait, decreased activity tolerance, decreased balance, decreased endurance, decreased knowledge of use of DME, decreased mobility, difficulty walking, decreased ROM, decreased strength, increased muscle spasms, and pain.   ACTIVITY LIMITATIONS: carrying, lifting, bending, standing, squatting, sleeping, stairs, transfers, and locomotion level  PARTICIPATION LIMITATIONS: meal prep, cleaning, laundry, driving, shopping, community activity, and yard work  PERSONAL FACTORS: Right Hip replacement 2022, Low back Pain, MS, TIA October 2023; MI, November 2022; CAD, Sjorgens syndrome are also affecting patient's functional outcome. MS  REHAB POTENTIAL: Good  CLINICAL DECISION MAKING: Evolving/moderate complexity declining mobility   EVALUATION COMPLEXITY: Moderate   GOALS: Goals reviewed with patient? No  SHORT TERM GOALS: Target date:  09/29/2022    Patient will increase gross bilateral lower extremity strength by 5 pounds Baseline: Goal status: has returned to baseline from a few progress notes ago 10/16  2.  Patient  will increase lumbar flexion by 10 degrees Baseline:  Goal status:  mild improvement but still painful 10/16  3.  Patient will be independent with basic after exercise program and stretching program Baseline:  Goal status: has basic HEP achieved 10/16   LONG TERM GOALS: Target date: 09/29/2022     Patient will stand for greater than 30 minutes without increased pain Baseline:  Goal status: improving 5/15  continued improvement 10/16   2.  Patient will pick item up off the floor without increased low back pain Baseline:  Goal status:still having pain 5/15 improved but still painful 10/16   3.  Patient will ambulate community distances without report of increased low back pain Baseline:  Goal status:limited by pain but maintaining 7/25  4.  Patient will report improved ability to sleep through the night secondary to back pain Baseline:  Goal status: continues to have pain at night 7/25    PLAN:  PT FREQUENCY: 1x/week  PT DURATION: 10 weeks   PLANNED INTERVENTIONS: Therapeutic exercises, Therapeutic activity, Neuromuscular re-education, Balance training, Gait training, Patient/Family education, Self Care, Joint mobilization, Stair training, DME instructions, Aquatic Therapy, Dry Needling, Spinal mobilization, Cryotherapy, Moist heat, Ionotophoresis 4mg /ml Dexamethasone, and Manual therapy.  PLAN FOR NEXT SESSION: Consider manual therapy to the back and hips, consider trigger point dry needling to back and hips.  Reviewed basic HEP.  Begin active strengthening of hips and core.  Begin gait training and functional mobility training.  [facility], SPT  This entire session was performed under the direct supervision of a licensed PT.    Dessie Coma, PT 10/28/2022, 10:20 AM

## 2023-07-09 ENCOUNTER — Telehealth: Payer: Medicare Other | Admitting: Family

## 2023-07-09 ENCOUNTER — Other Ambulatory Visit: Payer: Self-pay | Admitting: Family

## 2023-07-09 ENCOUNTER — Encounter: Payer: Self-pay | Admitting: Nurse Practitioner

## 2023-07-09 DIAGNOSIS — N390 Urinary tract infection, site not specified: Secondary | ICD-10-CM | POA: Insufficient documentation

## 2023-07-09 DIAGNOSIS — I10 Essential (primary) hypertension: Secondary | ICD-10-CM

## 2023-07-09 NOTE — Telephone Encounter (Signed)
Mast, Man X, NP  You1 hour ago (2:00 PM)    Urine culture result is not ready, UA looks like UTI.  Thank you   Urine culture results in result notes.

## 2023-07-09 NOTE — Telephone Encounter (Signed)
Patient had visit with Marisue Humble D 12/6

## 2023-07-09 NOTE — Telephone Encounter (Signed)
Patient called to say that she got her urinalysis results back on Mychart and would like someone to call her back regarding these, and she thinks she needs an antibiotic [phone])

## 2023-07-09 NOTE — Telephone Encounter (Signed)
Patient would like urine culture results. Please advise

## 2023-07-10 ENCOUNTER — Other Ambulatory Visit: Payer: Self-pay

## 2023-07-10 MED ORDER — CIPROFLOXACIN HCL 500 MG PO TABS: 500.0000 mg | ORAL_TABLET | Freq: Two times a day (BID) | ORAL | 0 refills | Status: AC

## 2023-07-16 ENCOUNTER — Encounter: Payer: Self-pay | Admitting: Family

## 2023-07-16 ENCOUNTER — Ambulatory Visit (INDEPENDENT_AMBULATORY_CARE_PROVIDER_SITE_OTHER): Payer: Medicare Other | Admitting: Family

## 2023-07-16 VITALS — BP 136/88 | HR 60 | Temp 97.9°F | Resp 20 | Ht 62.0 in | Wt 188.0 lb

## 2023-07-16 DIAGNOSIS — N3281 Overactive bladder: Secondary | ICD-10-CM | POA: Diagnosis not present

## 2023-07-16 NOTE — Patient Instructions (Signed)
Please call provider and notify if Lorre Nick is working well.then will send in prescription

## 2023-07-16 NOTE — Progress Notes (Signed)
Provider: Richarda Blade FNP-C  Finesse Fielder, [name], NP  Patient Care Team: Oluwadara Gorman, [name], NP as PCP - General (Family Medicine) Maisie Fus, MD as PCP - Cardiology (Cardiology) Valeria Batman, MD (Inactive) as Consulting Physician (Orthopedic Surgery) [name], Pearletha Furl, MD (Neurology) Krista Blue, [place], OD (Optometry) Lynden Ang, NP as Nurse Practitioner (Obstetrics and Gynecology) Janalyn Harder, MD (Inactive) as Consulting Physician (Dermatology) Romero Belling, MD as Referring Physician (Physical Medicine and Rehabilitation)  Extended Emergency Contact Information Primary Emergency Contact: [facility] Phone: [phone] Mobile Phone: [phone] Relation: Friend Secondary Emergency Contact: Cranford,Jane Mobile Phone: [phone] Relation: Friend  Code Status:  Full Code  Goals of care: Advanced Directive information    07/03/2023    1:19 PM  Advanced Directives  Does Patient Have a Medical Advance Directive? Yes  Type of Estate agent of [place];Living will  Does patient want to make changes to medical advance directive? No - Patient declined  Copy of Healthcare Power of Attorney in Chart? Yes - validated most recent copy scanned in chart (See row information)     Chief Complaint  Patient presents with   Acute Visit    Patient presents today for medication review    HPI:  Pt is a 77 y.o. female seen today for an acute visit to discuss OAB medication.states tried to call urologist office but unable to get medication refilled.States Tolterodine 2 mg made her dizzy.Fell down twice.Dizziness worsen with moving her head.she called urologist and new medication Gemtisa but insurance did not cover.States was told just need a prior authorization and will be covered.states would like to try if it will work.she was seen here by Mast Maxie,NP ordered oxybutynin but it made her very dry with reduced urine output.Has been taking 1/2  tablet at noon instead of 10 mg twice daily.Has used Mybetriq in the past too.Had Cipro for UTI but states causes severe Acid Reflux so she stopped after two days.she denies any symptoms of UTI.   Past Medical History:  Diagnosis Date   Allergy    CAD (coronary artery disease)    s/p DES to RCA in 06/2021   Dry eyes    Endometrial polyp    Essential hypertension 08/25/2018   GERD (gastroesophageal reflux disease)    History of hiatal hernia    History of kidney stones    Hot flashes, menopausal 10/13/2011   Estradiol started    Hyperlipidemia    Jaundice as teenager   no problems since   Memory loss 09/21/2019     1/2 of feet numb all the time   Multiple sclerosis (HCC) dx 2001   Neuropathy    bilateral feet   Osteoarthritis    Sjogren's syndrome (HCC) dx oct 2021   sore muscvles, dry mouth and eyes   Stroke [facility])    Vertigo    Vision abnormalities    Past Surgical History:  Procedure Laterality Date   ABDOMINAL HYSTERECTOMY  2002   partial   COLONOSCOPY  01/27/2022   2 day prep   colonscopy  2011   CORONARY STENT INTERVENTION N/A 07/18/2021   Procedure: CORONARY STENT INTERVENTION;  Surgeon: Corky Crafts, MD;  Location: [facility] INVASIVE CV LAB;  Service: Cardiovascular;  Laterality: N/A;   DILATATION & CURETTAGE/HYSTEROSCOPY WITH MYOSURE N/A 06/08/2020   Procedure: DILATATION & CURETTAGE/HYSTEROSCOPY/Polypectomy WITH MYOSURE;  Surgeon: Toy Baker, DO;  Location: [facility];  Service: Gynecology;  Laterality: N/A;   LEFT HEART CATH AND CORONARY ANGIOGRAPHY  N/A 07/18/2021   Procedure: LEFT HEART CATH AND CORONARY ANGIOGRAPHY;  Surgeon: Corky Crafts, MD;  Location: [facility] INVASIVE CV LAB;  Service: Cardiovascular;  Laterality: N/A;   LUMBAR FUSION  2001   TOTAL HIP ARTHROPLASTY Right 01/17/2021   Procedure: TOTAL HIP ARTHROPLASTY ANTERIOR APPROACH;  Surgeon: Samson Frederic, MD;  Location: [facility] ORS;  Service: Orthopedics;  Laterality: Right;    UPPER GI ENDOSCOPY  yrs ago    Allergies  Allergen Reactions   Repatha [Evolocumab] Itching   Crestor [Rosuvastatin]     MYALGIA    Pravastatin    Statins Other (See Comments)    Muscle pain; Tolerates Crestor     Outpatient Encounter Medications as of 07/16/2023  Medication Sig   acetaminophen (TYLENOL) 500 MG tablet Take 1,000 mg by mouth every 6 (six) hours as needed for mild pain or moderate pain.   amoxicillin (AMOXIL) 500 MG tablet Take 4 tablets by mouth 1-2 hrs before dental work   aspirin EC 81 MG tablet Take 81 mg by mouth daily. Swallow whole.   carvedilol (COREG) 6.25 MG tablet Take 1 tablet (6.25 mg total) by mouth 2 (two) times daily with a meal.   cholecalciferol (VITAMIN D) 25 MCG (1000 UNIT) tablet Take 2,000 Units by mouth daily.   clonazePAM (KLONOPIN) 0.5 MG tablet Take 1 tablet (0.5 mg total) by mouth at bedtime.   Coenzyme Q10 (COQ-10) 100 MG CAPS Take 100 mg by mouth daily.   ezetimibe (ZETIA) 10 MG tablet Take 1 tablet (10 mg total) by mouth daily.   famotidine (PEPCID) 20 MG tablet Take 20 mg by mouth 2 (two) times daily as needed for heartburn or indigestion.   fexofenadine (ALLEGRA) 180 MG tablet Take 180 mg by mouth at bedtime.    Flaxseed, Linseed, (FLAXSEED OIL PO) Take 5-10 mLs by mouth 3 (three) times a week. Power   fluticasone (FLONASE) 50 MCG/ACT nasal spray Place 1 spray into both nostrils daily as needed for allergies or rhinitis.   hydrALAZINE (APRESOLINE) 25 MG tablet Take 1 tablet (25 mg total) by mouth in the morning and at bedtime.   losartan (COZAAR) 100 MG tablet TAKE 1 TABLET(100 MG) BY MOUTH DAILY   melatonin 5 MG TABS Take 5 mg by mouth at bedtime as needed (Sleep).   modafinil (PROVIGIL) 200 MG tablet One po qAM and one po qNoon   nitroGLYCERIN (NITROSTAT) 0.4 MG SL tablet Place 1 tablet (0.4 mg total) under the tongue every 5 (five) minutes as needed for chest pain.   Omega-3 Fatty Acids (OMEGA 3 500 PO) Take 500 mg by mouth daily.    pantoprazole (PROTONIX) 40 MG tablet TAKE 1 TABLET(40 MG) BY MOUTH TWICE DAILY   ciprofloxacin (CIPRO) 500 MG tablet Take 1 tablet (500 mg total) by mouth 2 (two) times daily for 7 days. (Patient not taking: Reported on 07/16/2023)   diclofenac Sodium (VOLTAREN) 1 % GEL Apply 2 g topically 4 (four) times daily. (Patient not taking: Reported on 07/16/2023)   DULoxetine (CYMBALTA) 30 MG capsule TAKE 1 CAPSULE(30 MG) BY MOUTH DAILY (Patient not taking: Reported on 07/16/2023)   Lidocaine 4 % PTCH Place 1 patch onto the skin daily as needed (mild pain). Remove & Discard patch within 12 hours or as directed by MD (Patient not taking: Reported on 07/16/2023)   oxybutynin (DITROPAN) 5 MG tablet Take 1 tablet (5 mg total) by mouth 2 (two) times daily. (Patient not taking: Reported on 07/16/2023)   No facility-administered encounter  medications on file as of 07/16/2023.    Review of Systems  Constitutional:  Negative for appetite change, chills, fatigue, fever and unexpected weight change.  Respiratory:  Negative for cough, chest tightness, shortness of breath and wheezing.   Cardiovascular:  Negative for chest pain, palpitations and leg swelling.  Gastrointestinal:  Negative for abdominal distention, abdominal pain, constipation, diarrhea, nausea and vomiting.  Genitourinary:  Negative for difficulty urinating, dysuria, flank pain and urgency.       OAB  Neurological:  Negative for dizziness, weakness, light-headedness and headaches.    Immunization History  Administered Date(s) Administered   Fluad Quad(high Dose 65+) 04/01/2019, 04/19/2021   Influenza Whole 05/14/2011   Influenza, High Dose Seasonal PF 04/30/2017, 05/15/2018, 05/18/2020   Influenza-Unspecified 03/04/2022   PFIZER(Purple Top)SARS-COV-2 Vaccination 08/27/2019, 09/21/2019, 03/07/2020, 11/16/2020   Pfizer Covid-19 Vaccine Bivalent Booster 96yrs & up 03/04/2022   Pfizer(Comirnaty)Fall Seasonal Vaccine 12 years and older 04/04/2023    Pneumococcal Conjugate-13 08/30/2018   Pneumococcal Polysaccharide-23 03/21/2009, 04/20/2020   Td (Adult), 2 Lf Tetanus Toxid, Preservative Free 12/04/2009   Zoster Recombinant(Shingrix) 04/04/2023   Zoster, Live 11/29/2009   Pertinent  Health Maintenance Due  Topic Date Due   DEXA SCAN  Never done   INFLUENZA VACCINE  10/19/2023 (Originally 02/19/2023)      10/27/2022   10:08 AM 12/26/2022   10:07 AM 03/26/2023    1:53 PM 05/22/2023   10:04 AM 07/03/2023    1:17 PM  Fall Risk  Falls in the past year? 1 1 0 1 1  Was there an injury with Fall? 1 0  0 1  Fall Risk Category Calculator 2 1  2 2   Patient at Risk for Falls Due to History of fall(s) History of fall(s)  History of fall(s) No Fall Risks  Fall risk Follow up Falls evaluation completed Falls evaluation completed   Falls evaluation completed   Functional Status Survey:    Vitals:   07/16/23 1040  BP: 136/88  Pulse: 60  Resp: 20  Temp: 97.9 F (36.6 C)  SpO2: 99%  Weight: 188 lb (85.3 kg)  Height: 5\' 2"  (1.575 m)   Body mass index is 34.39 kg/m. Physical Exam Vitals reviewed.  Constitutional:      General: She is not in acute distress.    Appearance: Normal appearance. She is obese. She is not ill-appearing or diaphoretic.  HENT:     Head: Normocephalic.     Right Ear: Tympanic membrane, ear canal and external ear normal. There is no impacted cerumen.     Left Ear: Tympanic membrane, ear canal and external ear normal. There is no impacted cerumen.     Nose: Nose normal. No congestion or rhinorrhea.     Mouth/Throat:     Mouth: Mucous membranes are moist.     Pharynx: Oropharynx is clear. No oropharyngeal exudate or posterior oropharyngeal erythema.  Eyes:     General: No scleral icterus.       Right eye: No discharge.        Left eye: No discharge.     Extraocular Movements: Extraocular movements intact.     Conjunctiva/sclera: Conjunctivae normal.     Pupils: Pupils are equal, round, and reactive to light.   Cardiovascular:     Rate and Rhythm: Normal rate and regular rhythm.     Pulses: Normal pulses.     Heart sounds: Normal heart sounds. No murmur heard.    No friction rub. No gallop.  Pulmonary:  Effort: Pulmonary effort is normal. No respiratory distress.     Breath sounds: Normal breath sounds. No wheezing, rhonchi or rales.  Chest:     Chest wall: No tenderness.  Abdominal:     General: Bowel sounds are normal. There is no distension.     Palpations: Abdomen is soft. There is no mass.     Tenderness: There is no abdominal tenderness. There is no right CVA tenderness, left CVA tenderness, guarding or rebound.  Musculoskeletal:        General: No swelling or tenderness. Normal range of motion.     Right lower leg: No edema.     Left lower leg: No edema.  Skin:    General: Skin is warm and dry.     Coloration: Skin is not pale.     Findings: No erythema or rash.  Neurological:     Mental Status: She is alert and oriented to person, place, and time.     Cranial Nerves: No cranial nerve deficit.     Gait: Gait normal.  Psychiatric:        Mood and Affect: Mood normal.        Speech: Speech normal.        Behavior: Behavior normal.     Labs reviewed: Recent Labs    07/23/22 0958 10/22/22 1018  NA 137 139  K 4.2 4.6  CL 102 105  CO2 28 25  GLUCOSE 80 93  BUN 17 26*  CREATININE 0.68 0.69  CALCIUM 9.0 9.2   Recent Labs    07/23/22 0958 10/22/22 1018 04/15/23 1029  AST 18 15 15   ALT 17 17 14   ALKPHOS  --   --  115  BILITOT 0.4 0.3 0.3  PROT 6.1 5.9* 6.1  ALBUMIN  --   --  4.3   Recent Labs    07/23/22 0958 10/22/22 1018 05/22/23 1059  WBC 5.5 7.6 5.5  NEUTROABS 4,202 6,209 3,982  HGB 13.2 13.5 13.9  HCT 39.4 40.7 42.5  MCV 91.2 89.3 93.0  PLT 200 214 248   Lab Results  Component Value Date   TSH 2.85 05/22/2023   Lab Results  Component Value Date   HGBA1C 5.4 06/15/2021   Lab Results  Component Value Date   CHOL 211 (H) 04/15/2023   HDL  56 04/15/2023   LDLCALC 133 (H) 04/15/2023   LDLDIRECT 130.2 11/03/2011   TRIG 124 04/15/2023   CHOLHDL 3.8 04/15/2023    Significant Diagnostic Results in last 30 days:  No results found.  Assessment/Plan   OAB (overactive bladder) (Primary) Has tried oxybutynin,mybetriq and Tolterodine but did not tolerate.Gemtisa prescribed by urologist but insurance did not cover.States was told just need a prior authorization then will be covered.would like to try Gemtisa but could not get in contact with urologist.2 weeks of samples for Gemtisa given to call provider if effective and no SE after one week then will send prescription.   Family/ staff Communication: Reviewed plan of care with patient verbalized understanding   Labs/tests ordered: None   Next Appointment: Return if symptoms worsen or fail to improve.   Caesar Bookman, NP

## 2023-07-23 ENCOUNTER — Encounter (HOSPITAL_BASED_OUTPATIENT_CLINIC_OR_DEPARTMENT_OTHER): Payer: Self-pay | Admitting: Physical Therapy

## 2023-07-23 ENCOUNTER — Ambulatory Visit (HOSPITAL_BASED_OUTPATIENT_CLINIC_OR_DEPARTMENT_OTHER): Payer: Medicare Other | Attending: Physical Medicine and Rehabilitation | Admitting: Physical Therapy

## 2023-07-23 DIAGNOSIS — R262 Difficulty in walking, not elsewhere classified: Secondary | ICD-10-CM | POA: Diagnosis not present

## 2023-07-23 DIAGNOSIS — R252 Cramp and spasm: Secondary | ICD-10-CM | POA: Diagnosis not present

## 2023-07-23 DIAGNOSIS — M5459 Other low back pain: Secondary | ICD-10-CM | POA: Insufficient documentation

## 2023-07-23 DIAGNOSIS — M6281 Muscle weakness (generalized): Secondary | ICD-10-CM | POA: Diagnosis not present

## 2023-07-23 NOTE — Therapy (Signed)
 OUTPATIENT PHYSICAL THERAPY THORACOLUMBAR Treatment/Progress note    Patient Name: Dawn [name] MRN: [id number] DOB:[birth year], 78 y.o., female Today's Date: 07/23/2023  END OF SESSION:  PT End of Session - 07/23/23 1240     Visit Number 42    Number of Visits 43    Date for PT Re-Evaluation 10/01/23    Authorization Type progress note at 52    PT Start Time 1100    PT Stop Time 1143    PT Time Calculation (min) 43 min    Activity Tolerance Patient tolerated treatment well    Behavior During Therapy [facility] for tasks assessed/performed                     Past Medical History:  Diagnosis Date   Allergy    CAD (coronary artery disease)    s/p DES to RCA in 06/2021   Dry eyes    Endometrial polyp    Essential hypertension 08/25/2018   GERD (gastroesophageal reflux disease)    History of hiatal hernia    History of kidney stones    Hot flashes, menopausal 10/13/2011   Estradiol  started    Hyperlipidemia    Jaundice as teenager   no problems since   Memory loss 09/21/2019     1/2 of feet numb all the time   Multiple sclerosis (HCC) dx 2001   Neuropathy    bilateral feet   Osteoarthritis    Sjogren's syndrome (HCC) dx oct 2021   sore muscvles, dry mouth and eyes   Stroke (HCC)    Vertigo    Vision abnormalities    Past Surgical History:  Procedure Laterality Date   ABDOMINAL HYSTERECTOMY  2002   partial   COLONOSCOPY  01/27/2022   2 day prep   colonscopy  2011   CORONARY STENT INTERVENTION N/A 07/18/2021   Procedure: CORONARY STENT INTERVENTION;  Surgeon: Dann Candyce RAMAN, MD;  Location: [facility];  Service: Cardiovascular;  Laterality: N/A;   DILATATION & CURETTAGE/HYSTEROSCOPY WITH MYOSURE N/A 06/08/2020   Procedure: DILATATION & CURETTAGE/HYSTEROSCOPY/Polypectomy WITH MYOSURE;  Surgeon: Rendell Calton DELENA, DO;  Location: [facility];  Service: Gynecology;  Laterality: N/A;   LEFT HEART CATH AND CORONARY ANGIOGRAPHY N/A  07/18/2021   Procedure: LEFT HEART CATH AND CORONARY ANGIOGRAPHY;  Surgeon: Dann Candyce RAMAN, MD;  Location: [facility] INVASIVE CV [facility];  Service: Cardiovascular;  Laterality: N/A;   LUMBAR FUSION  2001   TOTAL HIP ARTHROPLASTY Right 01/17/2021   Procedure: TOTAL HIP ARTHROPLASTY ANTERIOR APPROACH;  Surgeon: Fidel Rogue, MD;  Location: [facility];  Service: Orthopedics;  Laterality: Right;   UPPER GI ENDOSCOPY  yrs ago   Patient Active Problem List   Diagnosis Date Noted   UTI (urinary tract infection) 07/09/2023   OAB (overactive bladder) 07/03/2023   Statin myopathy 06/26/2023   History of CVA (cerebrovascular accident) 06/26/2023   Rheumatoid arthritis involving right hand, unspecified whether rheumatoid factor present (HCC) 05/22/2023   Atherosclerotic heart disease of native coronary artery with other forms of angina pectoris (HCC) 05/22/2023   Positive colorectal cancer screening using Cologuard test 11/14/2021   Coronary artery disease    Statin intolerance 04/19/2021   Osteoarthritis of right hip 01/17/2021   Status post THR (total hip replacement) 01/17/2021   Chronic bilateral low back pain without sciatica 10/02/2020   Chronic pain syndrome 06/20/2020   Post laminectomy syndrome 06/20/2020   Sjogren's disease (HCC) 05/23/2020   Primary osteoarthritis of both hands  05/23/2020   Primary osteoarthritis of both feet 05/23/2020   Sjogren's syndrome (HCC) 05/23/2020   Other secondary scoliosis, lumbar region 12/22/2019   Spondylosis without myelopathy or radiculopathy, lumbar region 12/22/2019   Cervical radiculopathy 10/18/2019   Numbness 09/21/2019   Bilateral carpal tunnel syndrome 09/21/2019   High risk medication use 09/21/2019   Memory loss 09/21/2019   Essential hypertension 08/25/2018   Abnormal SPEP 02/19/2018   Hand pain 02/20/2017   Multiple joint pain 02/18/2017   Disturbed cognition 06/25/2016   Impaired cognition 06/25/2016   Sciatica, right side 10/30/2015    Chronic fatigue 08/04/2014   Gait disturbance 08/04/2014   Unspecified visual disturbance 01/31/2013   Transient vision disturbance 01/31/2013   Colon polyps 11/03/2011   POSTHERPETIC NEURALGIA 10/03/2009   DISPLCMT LUMBAR INTERVERT DISC W/O MYELOPATHY 09/05/2009   RENAL CALCULUS, RECURRENT 09/04/2009   Hyperlipidemia 08/31/2009   Multiple sclerosis (HCC) 08/31/2009   ALLERGIC RHINITIS 08/31/2009  Progress Note Reporting Period10/09/2022 to 07/23/2023  See note below for Objective Data and Assessment of Progress/Goals.     PCP: Roxan Feast NP  REFERRING PROVIDER: Dr Darryle Running   REFERRING DIAG: Low Back Pain   Rationale for Evaluation and Treatment: Rehabilitation  THERAPY DIAG:  Difficulty in walking, not elsewhere classified  Other low back pain  Cramp and spasm  Muscle weakness (generalized)  ONSET DATE: About 11 days ago her nerve abiliation wore off.   SUBJECTIVE:                                                                                                                                                                                           SUBJECTIVE STATEMENT: The patient's right hip has been painful. She is not sure when she can have her shots again. She has been trying to work on her exercises and working on walking as much as able.   PERTINENT HISTORY:  Right Hip replacement 2022, Low back Pain, MS, TIA October 2023; MI, November 2022; CAD, Sjorgens syndrome  PAIN:  Are you having pain? Yes: NPRS scale: 6/10 Pain location left side lumbar spine Pain description: aching  Aggravating factors: standing and walking  Relieving factors: rest    PRECAUTIONS: None  WEIGHT BEARING RESTRICTIONS: No  FALLS:  Has patient fallen in last 6 months? No  LIVING ENVIRONMENT: 3 steps into the house  OCCUPATION: retired [employer]: get back to the pool    PLOF: Independent  PATIENT GOALS:  To have less pain/ to improve general mobility    NEXT MD VISIT:   OBJECTIVE:   DIAGNOSTIC FINDINGS:    PATIENT SURVEYS:  FOTO  SCREENING FOR RED FLAGS: Bowel or bladder incontinence: No Spinal tumors: No Cauda equina syndrome: No Compression fracture: No Abdominal aneurysm: No  COGNITION: Overall cognitive status: Within functional limits for tasks assessed     SENSATION: Peripheral neuropathy   MUSCLE LENGTH:  POSTURE: No Significant postural limitations  PALPATION: Significant tenderness to palpation in the hips and lower back.   LUMBAR ROM:   AROM eval 5/15 10/3  Flexion Limited 50 % with pain  Mild improvement but still about 50%  Improved pain but still limited  Extension No limit     Right lateral flexion     Left lateral flexion     Right rotation Limited 50% with pain     Left rotation Limited 50% with pain      (Blank rows = not tested)  LOWER EXTREMITY ROM:     Passive  Right eval Left eval Right  Right  7/24 Right 1/2  Hip flexion 105 90 105 110 with pain at end range  100 with pain   Hip extension   10 degrees from neutral before manual today. Too neutral after stretching     Hip abduction       Hip adduction       Hip internal rotation painful Painful  Painful  Full with less pain  Painful   Hip external rotation   20 degrees with pain  45 with pain  48 with pain   Knee flexion       Knee extension       Ankle dorsiflexion       Ankle plantarflexion       Ankle inversion       Ankle eversion        (Blank rows = not tested)  LOWER EXTREMITY MMT:    MMT Right eval Left eval Right  3/26 Left  3/26 Right 5/15 Left 5/15 Right 7/24 Left 7/24 Right  10/2 Left 10/2 Left 1/2 Right  1/2  Hip flexion 13.1 12.3 15.1 26.1 18.2 26.2 11.3 19.1 15.3 23.4 16.7 23.4  Hip extension              Hip abduction 11.5 10.1 13.7 24.4 15.9 24 12.6 17.8 23.8 28.5 25.6 27.9  Hip adduction              Hip internal rotation              Hip external rotation              Knee flexion               Knee extension 14.0 13.3 17.5 33.3 20 38.2 19.4 18.3 23.8 23.6 22.6 24.8  Ankle dorsiflexion              Ankle plantarflexion              Ankle inversion              Ankle eversion               (Blank rows = not tested)  FUNCTIONAL TESTS:  5 times sit to stand: 28 sec  5/15 5 times sit to stand test 20 seconds 7/25 24 sec  10/16 17 sec 1/217 seconds  3-minute walk test 150 feet without requirement of seated rest break 5/15  7/25 3 min walk test 200'   10/16 3 min walk test 379'   Will assess next visit secondary to time  GAIT:  TODAY'S  TREATMENT:                                                                                                                              DATE:  1/2 Manual: trigger point release to gluteal and lower lumbar spine with focus on the left side.. Trigger point release to hips and spine; Roller to anterior hip.   LTR 2x20  SAQ 3 x 15 each leg Supine hip abduction 3 x 15 red band  LAQ 2 x 15  Standing: Squat to heel raise 3 x 10  Reviewed strength and functional measurements  12/18  Manual: trigger point release to gluteal and lower lumbar spine with focus on the left side.. Trigger point release to hips and spine; Roller to anterior hip.   Supine marches 3x10  Hip adduction ball squeezes 2x15 Supine Clam shells 2x12 GTB  Seated LAQ red 2x15 each leg  12/11  Manual: trigger point release to gluteal and lower lumbar spine with focus on the left side.. Trigger point release to hips and spine; Roller to anterior hip.   Seated march red band 3 x 15 Seated hip adduction with ball 3 x 15 Seated clamshell red 3 x 15 Seated LAQ red band 3 x 12 bilateral.  12/4 Manual: trigger point release to gluteal and lower lumbar spine. Trigger point release to hips and spine; Roller to anterior hip.    Supine marches 3x10  Hip adduction ball squeezes 2x15 Supine Clam shells 2x12 GTB  Seated LAQ red 2x15 each leg  Standing weight  shifts x20 Standing low march 2x10    11/30 Manual: trigger point release to gluteal and lower lumbar spine. Trigger point release to hips and spine; Roller to anterior hip.   Supine marches 3x10  Hip adduction ball squeezes 2x15 Supine Clam shells 2x12 GTB  Standing weight shifts on airex pad x20  11/20 Nu-step 5 min L5  Ball roll out fwd x10  Ball roll out lateral x10 each direction  [employer] Green 3x15  Shoulder extension 3x15 green   Bilateral er red 3x15  Horizontal abduction 3x15 red  Bicpes curl 3lbs 3x15 red   All with cuing for abdominal breathing.         PATIENT EDUCATION:  Education details: reviewed HEP, symptom management  Person educated: Patient Education method: Explanation, Demonstration, Tactile cues, Verbal cues, and Handouts Education comprehension: verbalized understanding, returned demonstration, verbal cues required, tactile cues required, and needs further education  HOME EXERCISE PROGRAM: Has previous HEP. Will review next visit  ASSESSMENT:  CLINICAL IMPRESSION: The patient has made been able to maintain her strength.  She is also increased her activity level overall.  She continues to try to walk for exercise and she continues to try to do her exercises at home.  She has had increased pain in her hip over the past 2 weeks.  Therapy worked on manual therapy today to reduce spasming  in her hip and low back.  She reported improved mobility following manual therapy.  Her hip motion is slightly reduced from last reassessment.  Her strength is for the most part maintain despite pain.  She would benefit from further skilled therapy to 1 W10 to help maintain strength and general mobility.  She continues to require skilled therapy secondary to her progressive neurological disorder, diffuse pain throughout lower extremities, and requiring assistance for progression of exercises secondary to multiple sclerosis.     OBJECTIVE IMPAIRMENTS:  Abnormal gait, decreased activity tolerance, decreased balance, decreased endurance, decreased knowledge of use of DME, decreased mobility, difficulty walking, decreased ROM, decreased strength, increased muscle spasms, and pain.   ACTIVITY LIMITATIONS: carrying, lifting, bending, standing, squatting, sleeping, stairs, transfers, and locomotion level  PARTICIPATION LIMITATIONS: meal prep, cleaning, laundry, driving, shopping, community activity, and yard work  PERSONAL FACTORS: Right Hip replacement 2022, Low back Pain, MS, TIA October 2023; MI, November 2022; CAD, Sjorgens syndrome are also affecting patient's functional outcome. MS  REHAB POTENTIAL: Good  CLINICAL DECISION MAKING: Evolving/moderate complexity declining mobility   EVALUATION COMPLEXITY: Moderate   GOALS: Goals reviewed with patient? No  SHORT TERM GOALS: Target date: 09/29/2022    Patient will increase gross bilateral lower extremity strength by 5 pounds Baseline: Goal status: has returned to baseline from a few progress notes ago 10/16  2.  Patient will increase lumbar flexion by 10 degrees Baseline:  Goal status:  mild improvement but still painful 10/16  3.  Patient will be independent with basic after exercise program and stretching program Baseline:  Goal status: has basic HEP achieved 10/16   LONG TERM GOALS: Target date: 09/29/2022     Patient will stand for greater than 30 minutes without increased pain Baseline:  Goal status: improving 5/15  continued improvement 10/16   2.  Patient will pick item up off the floor without increased low back pain Baseline:  Goal status:still having pain 5/15 improved but still painful 10/16   3.  Patient will ambulate community distances without report of increased low back pain Baseline:  Goal status:limited by pain but maintaining 7/25  4.  Patient will report improved ability to sleep through the night secondary to back pain Baseline:  Goal status:  continues to have pain at night 7/25    PLAN:  PT FREQUENCY: 1x/week  PT DURATION: 10 weeks   PLANNED INTERVENTIONS: Therapeutic exercises, Therapeutic activity, Neuromuscular re-education, Balance training, Gait training, Patient/Family education, Self Care, Joint mobilization, Stair training, DME instructions, Aquatic Therapy, Dry Needling, Spinal mobilization, Cryotherapy, Moist heat, Ionotophoresis 4mg /ml Dexamethasone , and Manual therapy.  PLAN FOR NEXT SESSION: Consider manual therapy to the back and hips, consider trigger point dry needling to back and hips.  Reviewed basic HEP.  Begin active strengthening of hips and core.  Begin gait training and functional mobility training.       Dawn [name], PT 10/28/2022, 1:21 PM

## 2023-07-29 ENCOUNTER — Ambulatory Visit (HOSPITAL_BASED_OUTPATIENT_CLINIC_OR_DEPARTMENT_OTHER): Payer: Medicare Other | Admitting: Physical Therapy

## 2023-07-29 ENCOUNTER — Encounter (HOSPITAL_BASED_OUTPATIENT_CLINIC_OR_DEPARTMENT_OTHER): Payer: Self-pay | Admitting: Physical Therapy

## 2023-07-29 DIAGNOSIS — M6281 Muscle weakness (generalized): Secondary | ICD-10-CM

## 2023-07-29 DIAGNOSIS — M5459 Other low back pain: Secondary | ICD-10-CM

## 2023-07-29 DIAGNOSIS — R262 Difficulty in walking, not elsewhere classified: Secondary | ICD-10-CM

## 2023-07-29 DIAGNOSIS — R252 Cramp and spasm: Secondary | ICD-10-CM | POA: Diagnosis not present

## 2023-07-30 NOTE — Therapy (Signed)
 OUTPATIENT PHYSICAL THERAPY THORACOLUMBAR Treatment/Progress note    Patient Name: DOSHIE MAGGI MRN: [id number] DOB:11-04-1945, 78 y.o., female Today's Date: 07/30/2023  END OF SESSION:  PT End of Session - 07/30/23 0847     Visit Number 43    Number of Visits 52    Date for PT Re-Evaluation 10/01/23    Authorization Type progress note at 52    PT Start Time 1020    PT Stop Time 1058    PT Time Calculation (min) 38 min    Activity Tolerance Patient tolerated treatment well    Behavior During Therapy [facility] for tasks assessed/performed                     Past Medical History:  Diagnosis Date   Allergy    CAD (coronary artery disease)    s/p DES to RCA in 06/2021   Dry eyes    Endometrial polyp    Essential hypertension 08/25/2018   GERD (gastroesophageal reflux disease)    History of hiatal hernia    History of kidney stones    Hot flashes, menopausal 10/13/2011   Estradiol  started    Hyperlipidemia    Jaundice as teenager   no problems since   Memory loss 09/21/2019     1/2 of feet numb all the time   Multiple sclerosis (HCC) dx 2001   Neuropathy    bilateral feet   Osteoarthritis    Sjogren's syndrome (HCC) dx oct 2021   sore muscvles, dry mouth and eyes   Stroke (HCC)    Vertigo    Vision abnormalities    Past Surgical History:  Procedure Laterality Date   ABDOMINAL HYSTERECTOMY  2002   partial   COLONOSCOPY  01/27/2022   2 day prep   colonscopy  2011   CORONARY STENT INTERVENTION N/A 07/18/2021   Procedure: CORONARY STENT INTERVENTION;  Surgeon: Dann Candyce RAMAN, MD;  Location: [facility];  Service: Cardiovascular;  Laterality: N/A;   DILATATION & CURETTAGE/HYSTEROSCOPY WITH MYOSURE N/A 06/08/2020   Procedure: DILATATION & CURETTAGE/HYSTEROSCOPY/Polypectomy WITH MYOSURE;  Surgeon: Rendell Calton DELENA, DO;  Location: [facility];  Service: Gynecology;  Laterality: N/A;   LEFT HEART CATH AND CORONARY ANGIOGRAPHY N/A  07/18/2021   Procedure: LEFT HEART CATH AND CORONARY ANGIOGRAPHY;  Surgeon: Dann Candyce RAMAN, MD;  Location: [facility] CV [facility];  Service: Cardiovascular;  Laterality: N/A;   LUMBAR FUSION  2001   TOTAL HIP ARTHROPLASTY Right 01/17/2021   Procedure: TOTAL HIP ARTHROPLASTY ANTERIOR APPROACH;  Surgeon: Fidel Rogue, MD;  Location: [facility];  Service: Orthopedics;  Laterality: Right;   UPPER GI ENDOSCOPY  yrs ago   Patient Active Problem List   Diagnosis Date Noted   UTI (urinary tract infection) 07/09/2023   OAB (overactive bladder) 07/03/2023   Statin myopathy 06/26/2023   History of CVA (cerebrovascular accident) 06/26/2023   Rheumatoid arthritis involving right hand, unspecified whether rheumatoid factor present (HCC) 05/22/2023   Atherosclerotic heart disease of native coronary artery with other forms of angina pectoris (HCC) 05/22/2023   Positive colorectal cancer screening using Cologuard test 11/14/2021   Coronary artery disease    Statin intolerance 04/19/2021   Osteoarthritis of right hip 01/17/2021   Status post THR (total hip replacement) 01/17/2021   Chronic bilateral low back pain without sciatica 10/02/2020   Chronic pain syndrome 06/20/2020   Post laminectomy syndrome 06/20/2020   Sjogren's disease (HCC) 05/23/2020   Primary osteoarthritis of both hands  05/23/2020   Primary osteoarthritis of both feet 05/23/2020   Sjogren's syndrome (HCC) 05/23/2020   Other secondary scoliosis, lumbar region 12/22/2019   Spondylosis without myelopathy or radiculopathy, lumbar region 12/22/2019   Cervical radiculopathy 10/18/2019   Numbness 09/21/2019   Bilateral carpal tunnel syndrome 09/21/2019   High risk medication use 09/21/2019   Memory loss 09/21/2019   Essential hypertension 08/25/2018   Abnormal SPEP 02/19/2018   Hand pain 02/20/2017   Multiple joint pain 02/18/2017   Disturbed cognition 06/25/2016   Impaired cognition 06/25/2016   Sciatica, right side 10/30/2015    Chronic fatigue 08/04/2014   Gait disturbance 08/04/2014   Unspecified visual disturbance 01/31/2013   Transient vision disturbance 01/31/2013   Colon polyps 11/03/2011   POSTHERPETIC NEURALGIA 10/03/2009   DISPLCMT LUMBAR INTERVERT DISC W/O MYELOPATHY 09/05/2009   RENAL CALCULUS, RECURRENT 09/04/2009   Hyperlipidemia 08/31/2009   Multiple sclerosis (HCC) 08/31/2009   ALLERGIC RHINITIS 08/31/2009  Progress Note Reporting Period10/09/2022 to 07/23/2023  See note below for Objective Data and Assessment of Progress/Goals.     PCP: Roxan Feast NP  REFERRING PROVIDER: Dr Darryle Running   REFERRING DIAG: Low Back Pain   Rationale for Evaluation and Treatment: Rehabilitation  THERAPY DIAG:  Difficulty in walking, not elsewhere classified  Other low back pain  Cramp and spasm  Muscle weakness (generalized)  ONSET DATE: About 11 days ago her nerve abiliation wore off.   SUBJECTIVE:                                                                                                                                                                                           SUBJECTIVE STATEMENT: The patient continues to have soreness with activity in the anterior and lateral portion of the right hip .   PERTINENT HISTORY:  Right Hip replacement 2022, Low back Pain, MS, TIA October 2023; MI, November 2022; CAD, Sjorgens syndrome  PAIN:  Are you having pain? Yes: NPRS scale: 6/10 Pain location left side lumbar spine Pain description: aching  Aggravating factors: standing and walking  Relieving factors: rest    PRECAUTIONS: None  WEIGHT BEARING RESTRICTIONS: No  FALLS:  Has patient fallen in last 6 months? No  LIVING ENVIRONMENT: 3 steps into the house  OCCUPATION: retired [employer]: get back to the pool    PLOF: Independent  PATIENT GOALS:  To have less pain/ to improve general mobility   NEXT MD VISIT:   OBJECTIVE:   DIAGNOSTIC FINDINGS:     PATIENT SURVEYS:  FOTO    SCREENING FOR RED FLAGS: Bowel or bladder incontinence: No Spinal tumors: No Cauda  equina syndrome: No Compression fracture: No Abdominal aneurysm: No  COGNITION: Overall cognitive status: Within functional limits for tasks assessed     SENSATION: Peripheral neuropathy   MUSCLE LENGTH:  POSTURE: No Significant postural limitations  PALPATION: Significant tenderness to palpation in the hips and lower back.   LUMBAR ROM:   AROM eval 5/15 10/3  Flexion Limited 50 % with pain  Mild improvement but still about 50%  Improved pain but still limited  Extension No limit     Right lateral flexion     Left lateral flexion     Right rotation Limited 50% with pain     Left rotation Limited 50% with pain      (Blank rows = not tested)  LOWER EXTREMITY ROM:     Passive  Right eval Left eval Right  Right  7/24 Right 1/2  Hip flexion 105 90 105 110 with pain at end range  100 with pain   Hip extension   10 degrees from neutral before manual today. Too neutral after stretching     Hip abduction       Hip adduction       Hip internal rotation painful Painful  Painful  Full with less pain  Painful   Hip external rotation   20 degrees with pain  45 with pain  48 with pain   Knee flexion       Knee extension       Ankle dorsiflexion       Ankle plantarflexion       Ankle inversion       Ankle eversion        (Blank rows = not tested)  LOWER EXTREMITY MMT:    MMT Right eval Left eval Right  3/26 Left  3/26 Right 5/15 Left 5/15 Right 7/24 Left 7/24 Right  10/2 Left 10/2 Left 1/2 Right  1/2  Hip flexion 13.1 12.3 15.1 26.1 18.2 26.2 11.3 19.1 15.3 23.4 16.7 23.4  Hip extension              Hip abduction 11.5 10.1 13.7 24.4 15.9 24 12.6 17.8 23.8 28.5 25.6 27.9  Hip adduction              Hip internal rotation              Hip external rotation              Knee flexion              Knee extension 14.0 13.3 17.5 33.3 20 38.2 19.4 18.3  23.8 23.6 22.6 24.8  Ankle dorsiflexion              Ankle plantarflexion              Ankle inversion              Ankle eversion               (Blank rows = not tested)  FUNCTIONAL TESTS:  5 times sit to stand: 28 sec  5/15 5 times sit to stand test 20 seconds 7/25 24 sec  10/16 17 sec 1/217 seconds  3-minute walk test 150 feet without requirement of seated rest break 5/15  7/25 3 min walk test 200'   10/16 3 min walk test 379'   Will assess next visit secondary to time  GAIT:  TODAY'S TREATMENT:  DATE:  1/2 Manual: trigger point release to gluteal and lower lumbar spine with focus on the left side.. Trigger point release to hips and spine; Roller to anterior hip.   LTR 2x20  SAQ 3 x 15 each leg Supine hip abduction 3 x 15 red band  LAQ 2 x 15  Standing: Squat to heel raise 3 x 10  Reviewed strength and functional measurements  1/9 Manual: trigger point release to gluteal and lower lumbar spine with focus on the left side.. Trigger point release to hips and spine; Roller to anterior hip.   Supine marches 3x10  Hip adduction ball squeezes 2x15 Supine Clam shells 2x12 GTB  Seated LAQ red 2x15 each leg Quad   12/18  Manual: trigger point release to gluteal and lower lumbar spine with focus on the left side.. Trigger point release to hips and spine; Roller to anterior hip.   Supine marches 3x10  Hip adduction ball squeezes 2x15 Supine Clam shells 2x12 GTB  Seated LAQ red 2x15 each leg  12/11  Manual: trigger point release to gluteal and lower lumbar spine with focus on the left side.. Trigger point release to hips and spine; Roller to anterior hip.   Seated march red band 3 x 15 Seated hip adduction with ball 3 x 15 Seated clamshell red 3 x 15 Seated LAQ red band 3 x 12 bilateral.  12/4 Manual: trigger point release to  gluteal and lower lumbar spine. Trigger point release to hips and spine; Roller to anterior hip.    Supine marches 3x10  Hip adduction ball squeezes 2x15 Supine Clam shells 2x12 GTB  Seated LAQ red 2x15 each leg  Standing weight shifts x20 Standing low march 2x10    11/30 Manual: trigger point release to gluteal and lower lumbar spine. Trigger point release to hips and spine; Roller to anterior hip.   Supine marches 3x10  Hip adduction ball squeezes 2x15 Supine Clam shells 2x12 GTB  Standing weight shifts on airex pad x20  11/20 Nu-step 5 min L5  Ball roll out fwd x10  Ball roll out lateral x10 each direction  [employer] Green 3x15  Shoulder extension 3x15 green   Bilateral er red 3x15  Horizontal abduction 3x15 red  Bicpes curl 3lbs 3x15 red   All with cuing for abdominal breathing.         PATIENT EDUCATION:  Education details: reviewed HEP, symptom management  Person educated: Patient Education method: Explanation, Demonstration, Tactile cues, Verbal cues, and Handouts Education comprehension: verbalized understanding, returned demonstration, verbal cues required, tactile cues required, and needs further education  HOME EXERCISE PROGRAM: Has previous HEP. Will review next visit  ASSESSMENT:  CLINICAL IMPRESSION: Patient continues to have significant soreness in her anterior lateral right hip.  Therapy focused on manual therapy to reduce trigger points in this area today.  She reports significant improvement in pain following treatment.  We continue to work on what exercises she can tolerate.  She is advised at home to continue to maintain strength and progress exercises as able.    OBJECTIVE IMPAIRMENTS: Abnormal gait, decreased activity tolerance, decreased balance, decreased endurance, decreased knowledge of use of DME, decreased mobility, difficulty walking, decreased ROM, decreased strength, increased muscle spasms, and pain.   ACTIVITY  LIMITATIONS: carrying, lifting, bending, standing, squatting, sleeping, stairs, transfers, and locomotion level  PARTICIPATION LIMITATIONS: meal prep, cleaning, laundry, driving, shopping, community activity, and yard work  PERSONAL FACTORS: Right Hip replacement 2022, Low back Pain,  MS, TIA October 2023; MI, November 2022; CAD, Sjorgens syndrome are also affecting patient's functional outcome. MS  REHAB POTENTIAL: Good  CLINICAL DECISION MAKING: Evolving/moderate complexity declining mobility   EVALUATION COMPLEXITY: Moderate   GOALS: Goals reviewed with patient? No  SHORT TERM GOALS: Target date: 09/29/2022    Patient will increase gross bilateral lower extremity strength by 5 pounds Baseline: Goal status: has returned to baseline from a few progress notes ago 10/16  2.  Patient will increase lumbar flexion by 10 degrees Baseline:  Goal status:  mild improvement but still painful 10/16  3.  Patient will be independent with basic after exercise program and stretching program Baseline:  Goal status: has basic HEP achieved 10/16   LONG TERM GOALS: Target date: 09/29/2022     Patient will stand for greater than 30 minutes without increased pain Baseline:  Goal status: improving 5/15  continued improvement 10/16   2.  Patient will pick item up off the floor without increased low back pain Baseline:  Goal status:still having pain 5/15 improved but still painful 10/16   3.  Patient will ambulate community distances without report of increased low back pain Baseline:  Goal status:limited by pain but maintaining 7/25  4.  Patient will report improved ability to sleep through the night secondary to back pain Baseline:  Goal status: continues to have pain at night 7/25    PLAN:  PT FREQUENCY: 1x/week  PT DURATION: 10 weeks   PLANNED INTERVENTIONS: Therapeutic exercises, Therapeutic activity, Neuromuscular re-education, Balance training, Gait training, Patient/Family  education, Self Care, Joint mobilization, Stair training, DME instructions, Aquatic Therapy, Dry Needling, Spinal mobilization, Cryotherapy, Moist heat, Ionotophoresis 4mg /ml Dexamethasone , and Manual therapy.  PLAN FOR NEXT SESSION: Consider manual therapy to the back and hips, consider trigger point dry needling to back and hips.  Reviewed basic HEP.  Begin active strengthening of hips and core.  Begin gait training and functional mobility training.       Alm JINNY Don, PT 10/28/2022, 9:34 AM

## 2023-08-04 ENCOUNTER — Telehealth: Payer: Self-pay

## 2023-08-04 DIAGNOSIS — N3281 Overactive bladder: Secondary | ICD-10-CM

## 2023-08-04 MED ORDER — GEMTESA 75 MG PO TABS: 75.0000 mg | ORAL_TABLET | Freq: Every day | ORAL | 5 refills | Status: DC

## 2023-08-04 NOTE — Telephone Encounter (Signed)
 Patient called requesting that we send a rx for Gemtisa 75 mg (not on active medication list) to [facility] (added to pharmacy list), its working for her. Patient is running out of samples and needs more (samples placed at front desk for pick up)  Patient provided contact information for representative at [facility] if we need it:  Name: Elsie Severa Pass- [phone]

## 2023-08-04 NOTE — Telephone Encounter (Signed)
 Noted. Patient has been called and will put up samples.  Message routed back to [name], [name], NP

## 2023-08-04 NOTE — Telephone Encounter (Signed)
 Gemtsa 75 mg tablet prescription sent to [facility] pharmacy a requested.May give patient 14 days samples if available until she can get her meds delivered.

## 2023-08-04 NOTE — Telephone Encounter (Signed)
 Thank You.

## 2023-08-05 ENCOUNTER — Encounter (HOSPITAL_BASED_OUTPATIENT_CLINIC_OR_DEPARTMENT_OTHER): Payer: Self-pay | Admitting: Physical Therapy

## 2023-08-05 ENCOUNTER — Ambulatory Visit (HOSPITAL_BASED_OUTPATIENT_CLINIC_OR_DEPARTMENT_OTHER): Payer: Medicare Other | Admitting: Physical Therapy

## 2023-08-05 DIAGNOSIS — M5459 Other low back pain: Secondary | ICD-10-CM | POA: Diagnosis not present

## 2023-08-05 DIAGNOSIS — R262 Difficulty in walking, not elsewhere classified: Secondary | ICD-10-CM

## 2023-08-05 DIAGNOSIS — M6281 Muscle weakness (generalized): Secondary | ICD-10-CM

## 2023-08-05 DIAGNOSIS — R252 Cramp and spasm: Secondary | ICD-10-CM | POA: Diagnosis not present

## 2023-08-05 NOTE — Therapy (Signed)
 OUTPATIENT PHYSICAL THERAPY THORACOLUMBAR Treatment/Progress note    Patient Name: Dawn [name] MRN: [id number] DOB:1946-05-06, 78 y.o., female Today's Date: 08/05/2023  END OF SESSION:  PT End of Session - 08/05/23 1255     Visit Number 44    Number of Visits 52    Date for PT Re-Evaluation 10/01/23    Authorization Type progress note at 52    PT Start Time 1023   Patient 8 minutes late   PT Stop Time 1100    PT Time Calculation (min) 37 min    Activity Tolerance Patient tolerated treatment well    Behavior During Therapy [facility] for tasks assessed/performed                     Past Medical History:  Diagnosis Date   Allergy    CAD (coronary artery disease)    s/p DES to RCA in 06/2021   Dry eyes    Endometrial polyp    Essential hypertension 08/25/2018   GERD (gastroesophageal reflux disease)    History of hiatal hernia    History of kidney stones    Hot flashes, menopausal 10/13/2011   Estradiol  started    Hyperlipidemia    Jaundice as teenager   no problems since   Memory loss 09/21/2019     1/2 of feet numb all the time   Multiple sclerosis (HCC) dx 2001   Neuropathy    bilateral feet   Osteoarthritis    Sjogren's syndrome (HCC) dx oct 2021   sore muscvles, dry mouth and eyes   Stroke (HCC)    Vertigo    Vision abnormalities    Past Surgical History:  Procedure Laterality Date   ABDOMINAL HYSTERECTOMY  2002   partial   COLONOSCOPY  01/27/2022   2 day prep   colonscopy  2011   CORONARY STENT INTERVENTION N/A 07/18/2021   Procedure: CORONARY STENT INTERVENTION;  Surgeon: Lucendia Rusk, MD;  Location: [facility];  Service: Cardiovascular;  Laterality: N/A;   DILATATION & CURETTAGE/HYSTEROSCOPY WITH MYOSURE N/A 06/08/2020   Procedure: DILATATION & CURETTAGE/HYSTEROSCOPY/Polypectomy WITH MYOSURE;  Surgeon: Olin Bertin, DO;  Location: [facility];  Service: Gynecology;  Laterality: N/A;   LEFT HEART CATH AND  CORONARY ANGIOGRAPHY N/A 07/18/2021   Procedure: LEFT HEART CATH AND CORONARY ANGIOGRAPHY;  Surgeon: Lucendia Rusk, MD;  Location: [facility]VAS[facility] CV [facility];  Service: Cardiovascular;  Laterality: N/A;   LUMBAR FUSION  2001   TOTAL HIP ARTHROPLASTY Right 01/17/2021   Procedure: TOTAL HIP ARTHROPLASTY ANTERIOR APPROACH;  Surgeon: Adonica Hoose, MD;  Location: [facility];  Service: Orthopedics;  Laterality: Right;   UPPER GI ENDOSCOPY  yrs ago   Patient Active Problem List   Diagnosis Date Noted   UTI (urinary tract infection) 07/09/2023   OAB (overactive bladder) 07/03/2023   Statin myopathy 06/26/2023   History of CVA (cerebrovascular accident) 06/26/2023   Rheumatoid arthritis involving right hand, unspecified whether rheumatoid factor present (HCC) 05/22/2023   Atherosclerotic heart disease of native coronary artery with other forms of angina pectoris (HCC) 05/22/2023   Positive colorectal cancer screening using Cologuard test 11/14/2021   Coronary artery disease    Statin intolerance 04/19/2021   Osteoarthritis of right hip 01/17/2021   Status post THR (total hip replacement) 01/17/2021   Chronic bilateral low back pain without sciatica 10/02/2020   Chronic pain syndrome 06/20/2020   Post laminectomy syndrome 06/20/2020   Sjogren's disease (HCC) 05/23/2020  Primary osteoarthritis of both hands 05/23/2020   Primary osteoarthritis of both feet 05/23/2020   Sjogren's syndrome (HCC) 05/23/2020   Other secondary scoliosis, lumbar region 12/22/2019   Spondylosis without myelopathy or radiculopathy, lumbar region 12/22/2019   Cervical radiculopathy 10/18/2019   Numbness 09/21/2019   Bilateral carpal tunnel syndrome 09/21/2019   High risk medication use 09/21/2019   Memory loss 09/21/2019   Essential hypertension 08/25/2018   Abnormal SPEP 02/19/2018   Hand pain 02/20/2017   Multiple joint pain 02/18/2017   Disturbed cognition 06/25/2016   Impaired cognition 06/25/2016   Sciatica,  right side 10/30/2015   Chronic fatigue 08/04/2014   Gait disturbance 08/04/2014   Unspecified visual disturbance 01/31/2013   Transient vision disturbance 01/31/2013   Colon polyps 11/03/2011   POSTHERPETIC NEURALGIA 10/03/2009   DISPLCMT LUMBAR INTERVERT DISC W/O MYELOPATHY 09/05/2009   RENAL CALCULUS, RECURRENT 09/04/2009   Hyperlipidemia 08/31/2009   Multiple sclerosis (HCC) 08/31/2009   ALLERGIC RHINITIS 08/31/2009  Progress Note Reporting Period10/09/2022 to 07/23/2023  See note below for Objective Data and Assessment of Progress/Goals.     PCP: Jenetta Misty NP  REFERRING PROVIDER: Dr Christophe Cram   REFERRING DIAG: Low Back Pain   Rationale for Evaluation and Treatment: Rehabilitation  THERAPY DIAG:  Difficulty in walking, not elsewhere classified  Other low back pain  Cramp and spasm  Muscle weakness (generalized)  ONSET DATE: About 11 days ago her nerve abiliation wore off.   SUBJECTIVE:                                                                                                                                                                                           SUBJECTIVE STATEMENT: The patient reports the cold has been hurting her significantly. She is having more pain in her right hip and lower back. She has been trying to do her exercises and keep moving as much as able.   PERTINENT HISTORY:  Right Hip replacement 2022, Low back Pain, MS, TIA October 2023; MI, November 2022; CAD, Sjorgens syndrome  PAIN:  Are you having pain? Yes: NPRS scale: 6/10 Pain location left side lumbar spine Pain description: aching  Aggravating factors: standing and walking  Relieving factors: rest    PRECAUTIONS: None  WEIGHT BEARING RESTRICTIONS: No  FALLS:  Has patient fallen in last 6 months? 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   LUMBAR ROM:   AROM eval 5/15 10/3  Flexion Limited 50 % with pain  Mild improvement but still about 50%  Improved pain but still limited  Extension No limit     Right lateral flexion     Left lateral flexion     Right rotation Limited 50% with pain     Left rotation Limited 50% with pain      (Blank rows = not tested)  LOWER EXTREMITY ROM:     Passive  Right eval Left eval Right  Right  7/24 Right 1/2  Hip flexion 105 90 105 110 with pain at end range  100 with pain   Hip extension   10 degrees from neutral before manual today. Too neutral after stretching     Hip abduction       Hip adduction       Hip internal rotation painful Painful  Painful  Full with less pain  Painful   Hip external rotation   20 degrees with pain  45 with pain  48 with pain   Knee flexion       Knee extension       Ankle dorsiflexion       Ankle plantarflexion       Ankle inversion       Ankle eversion        (Blank rows = not tested)  LOWER EXTREMITY MMT:    MMT Right eval Left eval Right  3/26 Left  3/26 Right 5/15 Left 5/15 Right 7/24 Left 7/24 Right  10/2 Left 10/2 Left 1/2 Right  1/2  Hip flexion 13.1 12.3 15.1 26.1 18.2 26.2 11.3 19.1 15.3 23.4 16.7 23.4  Hip extension              Hip abduction 11.5 10.1 13.7 24.4 15.9 24 12.6 17.8 23.8 28.5 25.6 27.9  Hip adduction              Hip internal rotation              Hip external  rotation              Knee flexion              Knee extension 14.0 13.3 17.5 33.3 20 38.2 19.4 18.3 23.8 23.6 22.6 24.8  Ankle dorsiflexion              Ankle plantarflexion              Ankle inversion              Ankle eversion               (Blank rows = not tested)  FUNCTIONAL TESTS:  5 times sit to stand: 28 sec  5/15 5 times sit to stand test 20 seconds 7/25 24 sec  10/16 17 sec 1/217 seconds  3-minute walk test 150 feet without requirement of seated rest break 5/15  7/25 3 min walk test 200'   10/16 3 min walk test 379'   Will assess next visit  secondary to time  GAIT:  TODAY'S TREATMENT:                                                                                                                              DATE:  1/15  Manual: trigger point release to gluteal and lower lumbar spine with focus on the left side.. Trigger point release to hips and spine; Roller to anterior hip.   LTR 2x20  SAQ 3 x 15 each leg Supine hip abduction 3 x 15 red band Trigger Point Dry Needling  Subsequent Treatment: Instructions provided previously at initial dry needling treatment.  Instructions reviewed, if requested by the patient, prior to subsequent dry needling treatment.   Patient Verbal Consent Given: Yes Education Handout Provided: Previously Provided Muscles Treated: right gluteal x3 using a .30x650 needle.  Electrical Stimulation Performed: No Treatment Response/Outcome: great twitch     1/2 Manual: trigger point release to gluteal and lower lumbar spine with focus on the left side.. Trigger point release to hips and spine; Roller to anterior hip.   LTR 2x20  SAQ 3 x 15 each leg Supine hip abduction 3 x 15 red band  LAQ 2 x 15  Standing: Squat to heel raise 3 x 10  Reviewed strength and functional measurements  1/9 Manual: trigger point release to gluteal and lower lumbar spine with focus on the left side.. Trigger point release to hips and spine;  Roller to anterior hip.   Supine marches 3x10  Hip adduction ball squeezes 2x15 Supine Clam shells 2x12 GTB  Seated LAQ red 2x15 each leg Quad   12/18  Manual: trigger point release to gluteal and lower lumbar spine with focus on the left side.. Trigger point release to hips and spine; Roller to anterior hip.   Supine marches 3x10  Hip adduction ball squeezes 2x15 Supine Clam shells 2x12 GTB  Seated LAQ red 2x15 each leg  12/11  Manual: trigger point release to gluteal and lower lumbar spine with focus on the left side.. Trigger point release to hips and spine; Roller to anterior hip.   Seated march red band 3 x 15 Seated hip adduction with ball 3 x 15 Seated clamshell red 3 x 15 Seated LAQ red band 3 x 12 bilateral.  12/4 Manual: trigger point release to gluteal and lower lumbar spine. Trigger point release to hips and spine; Roller to anterior hip.    Supine marches 3x10  Hip adduction ball squeezes 2x15 Supine Clam shells 2x12 GTB  Seated LAQ red 2x15 each leg  Standing weight shifts x20 Standing low march 2x10    11/30 Manual: trigger point release to gluteal and lower lumbar spine. Trigger point release to hips and spine; Roller to anterior hip.   Supine marches 3x10  Hip adduction ball squeezes 2x15 Supine Clam shells 2x12 GTB  Standing weight shifts on airex pad x20  11/20 Nu-step 5 min L5  Ball roll out fwd x10  Ball roll out lateral x10 each direction  [employer] 3x15  Shoulder extension 3x15 green   Bilateral er red 3x15  Horizontal abduction 3x15 red  Bicpes curl 3lbs 3x15 red   All with cuing for abdominal breathing.         PATIENT EDUCATION:  Education details: reviewed HEP, symptom management  Person educated: Patient Education method: Explanation, Demonstration, Tactile cues, Verbal cues, and Handouts Education comprehension: verbalized understanding, returned demonstration, verbal cues required, tactile cues  required, and needs further education  HOME EXERCISE PROGRAM: Has previous HEP. Will review next visit  ASSESSMENT:  CLINICAL IMPRESSION: Therapy performed needling to her gluteal today. She had significant trigger points. She reported feeling like it improved following the needling. She tolerated basic exercises but we were unable to advamce exercises today. .    OBJECTIVE IMPAIRMENTS: Abnormal gait, decreased activity tolerance, decreased balance, decreased endurance, decreased knowledge of use of DME, decreased mobility, difficulty walking, decreased ROM, decreased strength, increased muscle spasms, and pain.   ACTIVITY LIMITATIONS: carrying, lifting, bending, standing, squatting, sleeping, stairs, transfers, and locomotion level  PARTICIPATION LIMITATIONS: meal prep, cleaning, laundry, driving, shopping, community activity, and yard work  PERSONAL FACTORS: Right Hip replacement 2022, Low back Pain, MS, TIA October 2023; MI, November 2022; CAD, Sjorgens syndrome are also affecting patient's functional outcome. MS  REHAB POTENTIAL: Good  CLINICAL DECISION MAKING: Evolving/moderate complexity declining mobility   EVALUATION COMPLEXITY: Moderate   GOALS: Goals reviewed with patient? No  SHORT TERM GOALS: Target date: 09/29/2022    Patient will increase gross bilateral lower extremity strength by 5 pounds Baseline: Goal status: has returned to baseline from a few progress notes ago 10/16  2.  Patient will increase lumbar flexion by 10 degrees Baseline:  Goal status:  mild improvement but still painful 10/16  3.  Patient will be independent with basic after exercise program and stretching program Baseline:  Goal status: has basic HEP achieved 10/16   LONG TERM GOALS: Target date: 09/29/2022     Patient will stand for greater than 30 minutes without increased pain Baseline:  Goal status: improving 5/15  continued improvement 10/16   2.  Patient will pick item up off  the floor without increased low back pain Baseline:  Goal status:still having pain 5/15 improved but still painful 10/16   3.  Patient will ambulate community distances without report of increased low back pain Baseline:  Goal status:limited by pain but maintaining 7/25  4.  Patient will report improved ability to sleep through the night secondary to back pain Baseline:  Goal status: continues to have pain at night 7/25    PLAN:  PT FREQUENCY: 1x/week  PT DURATION: 10 weeks   PLANNED INTERVENTIONS: Therapeutic exercises, Therapeutic activity, Neuromuscular re-education, Balance training, Gait training, Patient/Family education, Self Care, Joint mobilization, Stair training, DME instructions, Aquatic Therapy, Dry Needling, Spinal mobilization, Cryotherapy, Moist heat, Ionotophoresis 4mg /ml Dexamethasone , and Manual therapy.  PLAN FOR NEXT SESSION: Consider manual therapy to the back and hips, consider trigger point dry needling to back and hips.  Reviewed basic HEP.  Begin active strengthening of hips and core.  Begin gait training and functional mobility training.       Kitty Perkins, PT 10/28/2022, 12:59 PM

## 2023-08-18 DIAGNOSIS — Z23 Encounter for immunization: Secondary | ICD-10-CM | POA: Diagnosis not present

## 2023-08-19 ENCOUNTER — Ambulatory Visit (HOSPITAL_BASED_OUTPATIENT_CLINIC_OR_DEPARTMENT_OTHER): Payer: Medicare Other | Admitting: Physical Therapy

## 2023-08-19 ENCOUNTER — Encounter (HOSPITAL_BASED_OUTPATIENT_CLINIC_OR_DEPARTMENT_OTHER): Payer: Self-pay | Admitting: Physical Therapy

## 2023-08-19 DIAGNOSIS — R252 Cramp and spasm: Secondary | ICD-10-CM

## 2023-08-19 DIAGNOSIS — M5459 Other low back pain: Secondary | ICD-10-CM

## 2023-08-19 DIAGNOSIS — M6281 Muscle weakness (generalized): Secondary | ICD-10-CM

## 2023-08-19 DIAGNOSIS — R262 Difficulty in walking, not elsewhere classified: Secondary | ICD-10-CM

## 2023-08-19 DIAGNOSIS — E785 Hyperlipidemia, unspecified: Secondary | ICD-10-CM | POA: Diagnosis not present

## 2023-08-20 ENCOUNTER — Encounter (HOSPITAL_BASED_OUTPATIENT_CLINIC_OR_DEPARTMENT_OTHER): Payer: Self-pay | Admitting: Physical Therapy

## 2023-08-20 ENCOUNTER — Encounter (HOSPITAL_BASED_OUTPATIENT_CLINIC_OR_DEPARTMENT_OTHER): Payer: Self-pay

## 2023-08-20 DIAGNOSIS — E785 Hyperlipidemia, unspecified: Secondary | ICD-10-CM

## 2023-08-20 DIAGNOSIS — Z8673 Personal history of transient ischemic attack (TIA), and cerebral infarction without residual deficits: Secondary | ICD-10-CM

## 2023-08-20 DIAGNOSIS — I2510 Atherosclerotic heart disease of native coronary artery without angina pectoris: Secondary | ICD-10-CM

## 2023-08-20 LAB — HEPATIC FUNCTION PANEL
ALT: 16 [IU]/L (ref 0–32)
AST: 19 [IU]/L (ref 0–40)
Albumin: 3.9 g/dL (ref 3.8–4.8)
Alkaline Phosphatase: 129 [IU]/L — ABNORMAL HIGH (ref 44–121)
Bilirubin Total: 0.4 mg/dL (ref 0.0–1.2)
Bilirubin, Direct: 0.15 mg/dL (ref 0.00–0.40)
Total Protein: 6 g/dL (ref 6.0–8.5)

## 2023-08-20 LAB — LIPID PANEL
Chol/HDL Ratio: 3.7 {ratio} (ref 0.0–4.4)
Cholesterol, Total: 181 mg/dL (ref 100–199)
HDL: 49 mg/dL (ref >39)
LDL Chol Calc (NIH): 110 mg/dL — ABNORMAL HIGH (ref 0–99)
Triglycerides: 123 mg/dL (ref 0–149)
VLDL Cholesterol Cal: 22 mg/dL (ref 5–40)

## 2023-08-20 NOTE — Therapy (Signed)
OUTPATIENT PHYSICAL THERAPY THORACOLUMBAR Treatment/Progress note    Patient Name: Dawn [name] MRN: [id number] DOB:09-01-1945, 78 y.o., female Today's Date: 08/20/2023  END OF SESSION:  PT End of Session - 08/19/23 1059     Visit Number 45    Number of Visits 52    Date for PT Re-Evaluation 10/01/23    Authorization Type progress note at 52    PT Start Time 1015    PT Stop Time 1058    PT Time Calculation (min) 43 min    Activity Tolerance Patient tolerated treatment well    Behavior During Therapy [facility] for tasks assessed/performed                     Past Medical History:  Diagnosis Date   Allergy    CAD (coronary artery disease)    s/p DES to RCA in 06/2021   Dry eyes    Endometrial polyp    Essential hypertension 08/25/2018   GERD (gastroesophageal reflux disease)    History of hiatal hernia    History of kidney stones    Hot flashes, menopausal 10/13/2011   Estradiol started    Hyperlipidemia    Jaundice as teenager   no problems since   Memory loss 09/21/2019     1/2 of feet numb all the time   Multiple sclerosis (HCC) dx 2001   Neuropathy    bilateral feet   Osteoarthritis    Sjogren's syndrome (HCC) dx oct 2021   sore muscvles, dry mouth and eyes   Stroke (HCC)    Vertigo    Vision abnormalities    Past Surgical History:  Procedure Laterality Date   ABDOMINAL HYSTERECTOMY  2002   partial   COLONOSCOPY  01/27/2022   2 day prep   colonscopy  2011   CORONARY STENT INTERVENTION N/A 07/18/2021   Procedure: CORONARY STENT INTERVENTION;  Surgeon: Corky Crafts, MD;  Location: [facility];  Service: Cardiovascular;  Laterality: N/A;   DILATATION & CURETTAGE/HYSTEROSCOPY WITH MYOSURE N/A 06/08/2020   Procedure: DILATATION & CURETTAGE/HYSTEROSCOPY/Polypectomy WITH MYOSURE;  Surgeon: Toy Baker, DO;  Location: [facility];  Service: Gynecology;  Laterality: N/A;   LEFT HEART CATH AND CORONARY ANGIOGRAPHY N/A  07/18/2021   Procedure: LEFT HEART CATH AND CORONARY ANGIOGRAPHY;  Surgeon: Corky Crafts, MD;  Location: [facility];  Service: Cardiovascular;  Laterality: N/A;   LUMBAR FUSION  2001   TOTAL HIP ARTHROPLASTY Right 01/17/2021   Procedure: TOTAL HIP ARTHROPLASTY ANTERIOR APPROACH;  Surgeon: Samson Frederic, MD;  Location: [facility];  Service: Orthopedics;  Laterality: Right;   UPPER GI ENDOSCOPY  yrs ago   Patient Active Problem List   Diagnosis Date Noted   UTI (urinary tract infection) 07/09/2023   OAB (overactive bladder) 07/03/2023   Statin myopathy 06/26/2023   History of CVA (cerebrovascular accident) 06/26/2023   Rheumatoid arthritis involving right hand, unspecified whether rheumatoid factor present (HCC) 05/22/2023   Atherosclerotic heart disease of native coronary artery with other forms of angina pectoris (HCC) 05/22/2023   Positive colorectal cancer screening using Cologuard test 11/14/2021   Coronary artery disease    Statin intolerance 04/19/2021   Osteoarthritis of right hip 01/17/2021   Status post THR (total hip replacement) 01/17/2021   Chronic bilateral low back pain without sciatica 10/02/2020   Chronic pain syndrome 06/20/2020   Post laminectomy syndrome 06/20/2020   Sjogren's disease (HCC) 05/23/2020   Primary osteoarthritis of both hands  05/23/2020   Primary osteoarthritis of both feet 05/23/2020   Sjogren's syndrome (HCC) 05/23/2020   Other secondary scoliosis, lumbar region 12/22/2019   Spondylosis without myelopathy or radiculopathy, lumbar region 12/22/2019   Cervical radiculopathy 10/18/2019   Numbness 09/21/2019   Bilateral carpal tunnel syndrome 09/21/2019   High risk medication use 09/21/2019   Memory loss 09/21/2019   Essential hypertension 08/25/2018   Abnormal SPEP 02/19/2018   Hand pain 02/20/2017   Multiple joint pain 02/18/2017   Disturbed cognition 06/25/2016   Impaired cognition 06/25/2016   Sciatica, right side 10/30/2015    Chronic fatigue 08/04/2014   Gait disturbance 08/04/2014   Unspecified visual disturbance 01/31/2013   Transient vision disturbance 01/31/2013   Colon polyps 11/03/2011   POSTHERPETIC NEURALGIA 10/03/2009   DISPLCMT LUMBAR INTERVERT DISC W/O MYELOPATHY 09/05/2009   RENAL CALCULUS, RECURRENT 09/04/2009   Hyperlipidemia 08/31/2009   Multiple sclerosis (HCC) 08/31/2009   ALLERGIC RHINITIS 08/31/2009  Progress Note Reporting Period10/09/2022 to 07/23/2023  See note below for Objective Data and Assessment of Progress/Goals.     PCP: Iantha Fallen NP  REFERRING PROVIDER: Dr Romero Belling   REFERRING DIAG: Low Back Pain   Rationale for Evaluation and Treatment: Rehabilitation  THERAPY DIAG:  Difficulty in walking, not elsewhere classified  Other low back pain  Cramp and spasm  Muscle weakness (generalized)  ONSET DATE: About 11 days ago her nerve abiliation wore off.   SUBJECTIVE:                                                                                                                                                                                           SUBJECTIVE STATEMENT: Patient reports she has had a significant increase in low back pain.  She does not remember what caused this.  Over the past 3 to 4 days she has had pain on the right side of her upper lumbar spine.  She continues to have pain into her gluteal.  She is also having right-sided hip pain although this is resolved somewhat.  She will see the MD next week about hip pain.  She plans to have injections. PERTINENT HISTORY:  Right Hip replacement 2022, Low back Pain, MS, TIA October 2023; MI, November 2022; CAD, Sjorgens syndrome  PAIN:  Are you having pain? Yes: NPRS scale: 6/10 Pain location left side lumbar spine Pain description: aching  Aggravating factors: standing and walking  Relieving factors: rest    PRECAUTIONS: None  WEIGHT BEARING RESTRICTIONS: No  FALLS:  Has patient fallen in last 6  months? No  LIVING ENVIRONMENT: 3 steps into the house  OCCUPATION: retired [employer]  Recreation: get back to the pool    PLOF: Independent  PATIENT GOALS:  To have less pain/ to improve general mobility   NEXT MD VISIT:   OBJECTIVE:   DIAGNOSTIC FINDINGS:    PATIENT SURVEYS:  FOTO    SCREENING FOR RED FLAGS: Bowel or bladder incontinence: No Spinal tumors: No Cauda equina syndrome: No Compression fracture: No Abdominal aneurysm: No  COGNITION: Overall cognitive status: Within functional limits for tasks assessed     SENSATION: Peripheral neuropathy   MUSCLE LENGTH:  POSTURE: No Significant postural limitations  PALPATION: Significant tenderness to palpation in the hips and lower back.   LUMBAR ROM:   AROM eval 5/15 10/3  Flexion Limited 50 % with pain  Mild improvement but still about 50%  Improved pain but still limited  Extension No limit     Right lateral flexion     Left lateral flexion     Right rotation Limited 50% with pain     Left rotation Limited 50% with pain      (Blank rows = not tested)  LOWER EXTREMITY ROM:     Passive  Right eval Left eval Right  Right  7/24 Right 1/2  Hip flexion 105 90 105 110 with pain at end range  100 with pain   Hip extension   10 degrees from neutral before manual today. Too neutral after stretching     Hip abduction       Hip adduction       Hip internal rotation painful Painful  Painful  Full with less pain  Painful   Hip external rotation   20 degrees with pain  45 with pain  48 with pain   Knee flexion       Knee extension       Ankle dorsiflexion       Ankle plantarflexion       Ankle inversion       Ankle eversion        (Blank rows = not tested)  LOWER EXTREMITY MMT:    MMT Right eval Left eval Right  3/26 Left  3/26 Right 5/15 Left 5/15 Right 7/24 Left 7/24 Right  10/2 Left 10/2 Left 1/2 Right  1/2  Hip flexion 13.1 12.3 15.1 26.1 18.2 26.2 11.3 19.1 15.3 23.4 16.7 23.4  Hip  extension              Hip abduction 11.5 10.1 13.7 24.4 15.9 24 12.6 17.8 23.8 28.5 25.6 27.9  Hip adduction              Hip internal rotation              Hip external rotation              Knee flexion              Knee extension 14.0 13.3 17.5 33.3 20 38.2 19.4 18.3 23.8 23.6 22.6 24.8  Ankle dorsiflexion              Ankle plantarflexion              Ankle inversion              Ankle eversion               (Blank rows = not tested)  FUNCTIONAL TESTS:  5 times sit to stand: 28 sec  5/15 5 times sit to stand test 20 seconds 7/25 24 sec  10/16  17 sec 1/217 seconds  3-minute walk test 150 feet without requirement of seated rest break 5/15  7/25 3 min walk test 200'   10/16 3 min walk test 379'   Will assess next visit secondary to time  GAIT:  TODAY'S TREATMENT:                                                                                                                              DATE:   Trigger Point Dry Needling  Subsequent Treatment: Instructions provided previously at initial dry needling treatment.  Instructions reviewed, if requested by the patient, prior to subsequent dry needling treatment.   Patient Verbal Consent Given: Yes Education Handout Provided: Previously Provided Muscles Treated: right gluteal x3 using a .30x650 needle. Right lumbar paraspinals at L1-L3 with care not to go into area of previous laminectomy. + Electrical Stimulation Performed: No Treatment Response/Outcome: great twitch     Manual: trigger point release to gluteal and lower lumbar spine with focus on the left side.. Trigger point release to hips and spine; Roller to anterior hip.     LTR 2x20  SAQ 3 x 15 each leg Supine hip abduction 3 x 15 red band 1/15  Manual: trigger point release to gluteal and lower lumbar spine with focus on the left side.. Trigger point release to hips and spine; Roller to anterior hip.   LTR 2x20  SAQ 3 x 15 each leg Supine hip abduction 3  x 15 red band Trigger Point Dry Needling  Subsequent Treatment: Instructions provided previously at initial dry needling treatment.  Instructions reviewed, if requested by the patient, prior to subsequent dry needling treatment.   Patient Verbal Consent Given: Yes Education Handout Provided: Previously Provided Muscles Treated: right gluteal x3 using a .30x650 needle.  Electrical Stimulation Performed: No Treatment Response/Outcome: great twitch     1/2 Manual: trigger point release to gluteal and lower lumbar spine with focus on the left side.. Trigger point release to hips and spine; Roller to anterior hip.   LTR 2x20  SAQ 3 x 15 each leg Supine hip abduction 3 x 15 red band  LAQ 2 x 15  Standing: Squat to heel raise 3 x 10  Reviewed strength and functional measurements  1/9 Manual: trigger point release to gluteal and lower lumbar spine with focus on the left side.. Trigger point release to hips and spine; Roller to anterior hip.   Supine marches 3x10  Hip adduction ball squeezes 2x15 Supine Clam shells 2x12 GTB  Seated LAQ red 2x15 each leg Quad   12/18  Manual: trigger point release to gluteal and lower lumbar spine with focus on the left side.. Trigger point release to hips and spine; Roller to anterior hip.   Supine marches 3x10  Hip adduction ball squeezes 2x15 Supine Clam shells 2x12 GTB  Seated LAQ red 2x15 each leg  12/11  Manual: trigger point release to gluteal and lower lumbar spine with  focus on the left side.. Trigger point release to hips and spine; Roller to anterior hip.   Seated march red band 3 x 15 Seated hip adduction with ball 3 x 15 Seated clamshell red 3 x 15 Seated LAQ red band 3 x 12 bilateral.  12/4 Manual: trigger point release to gluteal and lower lumbar spine. Trigger point release to hips and spine; Roller to anterior hip.    Supine marches 3x10  Hip adduction ball squeezes 2x15 Supine Clam shells 2x12 GTB  Seated LAQ  red 2x15 each leg  Standing weight shifts x20 Standing low march 2x10    11/30 Manual: trigger point release to gluteal and lower lumbar spine. Trigger point release to hips and spine; Roller to anterior hip.   Supine marches 3x10  Hip adduction ball squeezes 2x15 Supine Clam shells 2x12 GTB  Standing weight shifts on airex pad x20  11/20 Nu-step 5 min L5  Ball roll out fwd x10  Ball roll out lateral x10 each direction  [employer] Green 3x15  Shoulder extension 3x15 green   Bilateral er red 3x15  Horizontal abduction 3x15 red  Bicpes curl 3lbs 3x15 red   All with cuing for abdominal breathing.         PATIENT EDUCATION:  Education details: reviewed HEP, symptom management  Person educated: Patient Education method: Explanation, Demonstration, Tactile cues, Verbal cues, and Handouts Education comprehension: verbalized understanding, returned demonstration, verbal cues required, tactile cues required, and needs further education  HOME EXERCISE PROGRAM: Has previous HEP. Will review next visit  ASSESSMENT:  CLINICAL IMPRESSION: Therapy Lloyd Huger higher today in the low upper lumbar spine.  She had a great twitch response.  She reported improved pain following trigger point dry needling and manual therapy.  We are careful to stay above her area of her lumbar laminectomy.  We continue to review her exercises.  She is encouraged to continue to try her exercises much as possible.  Therapy will continue to progress as tolerated.  She will have injections following her next PT appointment.  OBJECTIVE IMPAIRMENTS: Abnormal gait, decreased activity tolerance, decreased balance, decreased endurance, decreased knowledge of use of DME, decreased mobility, difficulty walking, decreased ROM, decreased strength, increased muscle spasms, and pain.   ACTIVITY LIMITATIONS: carrying, lifting, bending, standing, squatting, sleeping, stairs, transfers, and locomotion  level  PARTICIPATION LIMITATIONS: meal prep, cleaning, laundry, driving, shopping, community activity, and yard work  PERSONAL FACTORS: Right Hip replacement 2022, Low back Pain, MS, TIA October 2023; MI, November 2022; CAD, Sjorgens syndrome are also affecting patient's functional outcome. MS  REHAB POTENTIAL: Good  CLINICAL DECISION MAKING: Evolving/moderate complexity declining mobility   EVALUATION COMPLEXITY: Moderate   GOALS: Goals reviewed with patient? No  SHORT TERM GOALS: Target date: 09/29/2022    Patient will increase gross bilateral lower extremity strength by 5 pounds Baseline: Goal status: has returned to baseline from a few progress notes ago 10/16  2.  Patient will increase lumbar flexion by 10 degrees Baseline:  Goal status:  mild improvement but still painful 10/16  3.  Patient will be independent with basic after exercise program and stretching program Baseline:  Goal status: has basic HEP achieved 10/16   LONG TERM GOALS: Target date: 09/29/2022     Patient will stand for greater than 30 minutes without increased pain Baseline:  Goal status: improving 5/15  continued improvement 10/16   2.  Patient will pick item up off the floor without increased low back pain  Baseline:  Goal status:still having pain 5/15 improved but still painful 10/16   3.  Patient will ambulate community distances without report of increased low back pain Baseline:  Goal status:limited by pain but maintaining 7/25  4.  Patient will report improved ability to sleep through the night secondary to back pain Baseline:  Goal status: continues to have pain at night 7/25    PLAN:  PT FREQUENCY: 1x/week  PT DURATION: 10 weeks   PLANNED INTERVENTIONS: Therapeutic exercises, Therapeutic activity, Neuromuscular re-education, Balance training, Gait training, Patient/Family education, Self Care, Joint mobilization, Stair training, DME instructions, Aquatic Therapy, Dry Needling,  Spinal mobilization, Cryotherapy, Moist heat, Ionotophoresis 4mg /ml Dexamethasone, and Manual therapy.  PLAN FOR NEXT SESSION: Consider manual therapy to the back and hips, consider trigger point dry needling to back and hips.  Reviewed basic HEP.  Begin active strengthening of hips and core.  Begin gait training and functional mobility training.       Dessie Coma, PT 10/28/2022, 9:13 AM

## 2023-08-25 MED ORDER — EZETIMIBE 10 MG PO TABS: 10.0000 mg | ORAL_TABLET | Freq: Every day | ORAL | 1 refills | Status: DC

## 2023-08-25 NOTE — Telephone Encounter (Signed)
Called and spoke to pt. Pt prefers staying on Zetia. Very hesitant on starting Leqvio. Refills sent in for pt to local pharmacy. Repeat labs in 6 months.

## 2023-08-25 NOTE — Addendum Note (Signed)
Addended by: Gabriel Rung on: 08/25/2023 05:04 PM   Modules accepted: Orders

## 2023-08-26 ENCOUNTER — Encounter (HOSPITAL_BASED_OUTPATIENT_CLINIC_OR_DEPARTMENT_OTHER): Payer: Self-pay | Admitting: Physical Therapy

## 2023-08-26 ENCOUNTER — Ambulatory Visit (HOSPITAL_BASED_OUTPATIENT_CLINIC_OR_DEPARTMENT_OTHER): Payer: Medicare Other | Attending: Physical Medicine and Rehabilitation | Admitting: Physical Therapy

## 2023-08-26 DIAGNOSIS — M5459 Other low back pain: Secondary | ICD-10-CM | POA: Insufficient documentation

## 2023-08-26 DIAGNOSIS — R262 Difficulty in walking, not elsewhere classified: Secondary | ICD-10-CM | POA: Diagnosis not present

## 2023-08-26 DIAGNOSIS — M6281 Muscle weakness (generalized): Secondary | ICD-10-CM | POA: Insufficient documentation

## 2023-08-26 DIAGNOSIS — M7061 Trochanteric bursitis, right hip: Secondary | ICD-10-CM | POA: Diagnosis not present

## 2023-08-26 DIAGNOSIS — M7062 Trochanteric bursitis, left hip: Secondary | ICD-10-CM | POA: Diagnosis not present

## 2023-08-26 DIAGNOSIS — R252 Cramp and spasm: Secondary | ICD-10-CM | POA: Diagnosis not present

## 2023-08-26 NOTE — Therapy (Signed)
 OUTPATIENT PHYSICAL THERAPY THORACOLUMBAR Treatment/Progress note    Patient Name: Dawn [name] MRN: [id number] DOB:1946-04-01, 78 y.o., female Today's Date: 08/27/2023  END OF SESSION:  PT End of Session - 08/27/23 1135     Visit Number 46    Number of Visits 52    Date for PT Re-Evaluation 10/01/23    Authorization Type progress note at 52    PT Start Time 1015    PT Stop Time 1057    PT Time Calculation (min) 42 min    Activity Tolerance Patient tolerated treatment well    Behavior During Therapy [facility] for tasks assessed/performed                      Past Medical History:  Diagnosis Date   Allergy    CAD (coronary artery disease)    s/p DES to RCA in 06/2021   Dry eyes    Endometrial polyp    Essential hypertension 08/25/2018   GERD (gastroesophageal reflux disease)    History of hiatal hernia    History of kidney stones    Hot flashes, menopausal 10/13/2011   Estradiol  started    Hyperlipidemia    Jaundice as teenager   no problems since   Memory loss 09/21/2019     1/2 of feet numb all the time   Multiple sclerosis (HCC) dx 2001   Neuropathy    bilateral feet   Osteoarthritis    Sjogren's syndrome (HCC) dx oct 2021   sore muscvles, dry mouth and eyes   Stroke (HCC)    Vertigo    Vision abnormalities    Past Surgical History:  Procedure Laterality Date   ABDOMINAL HYSTERECTOMY  2002   partial   COLONOSCOPY  01/27/2022   2 day prep   colonscopy  2011   CORONARY STENT INTERVENTION N/A 07/18/2021   Procedure: CORONARY STENT INTERVENTION;  Surgeon: Dann Candyce RAMAN, MD;  Location: [facility];  Service: Cardiovascular;  Laterality: N/A;   DILATATION & CURETTAGE/HYSTEROSCOPY WITH MYOSURE N/A 06/08/2020   Procedure: DILATATION & CURETTAGE/HYSTEROSCOPY/Polypectomy WITH MYOSURE;  Surgeon: Rendell Calton DELENA, DO;  Location: [facility];  Service: Gynecology;  Laterality: N/A;   LEFT HEART CATH AND CORONARY ANGIOGRAPHY N/A  07/18/2021   Procedure: LEFT HEART CATH AND CORONARY ANGIOGRAPHY;  Surgeon: Dann Candyce RAMAN, MD;  Location[facility] CV [facility];  Service: Cardiovascular;  Laterality: N/A;   LUMBAR FUSION  2001   TOTAL HIP ARTHROPLASTY Right 01/17/2021   Procedure: TOTAL HIP ARTHROPLASTY ANTERIOR APPROACH;  Surgeon: Fidel Rogue, MD;  Location: [facility];  Service: Orthopedics;  Laterality: Right;   UPPER GI ENDOSCOPY  yrs ago   Patient Active Problem List   Diagnosis Date Noted   UTI (urinary tract infection) 07/09/2023   OAB (overactive bladder) 07/03/2023   Statin myopathy 06/26/2023   History of CVA (cerebrovascular accident) 06/26/2023   Rheumatoid arthritis involving right hand, unspecified whether rheumatoid factor present (HCC) 05/22/2023   Atherosclerotic heart disease of native coronary artery with other forms of angina pectoris (HCC) 05/22/2023   Positive colorectal cancer screening using Cologuard test 11/14/2021   Coronary artery disease    Statin intolerance 04/19/2021   Osteoarthritis of right hip 01/17/2021   Status post THR (total hip replacement) 01/17/2021   Chronic bilateral low back pain without sciatica 10/02/2020   Chronic pain syndrome 06/20/2020   Post laminectomy syndrome 06/20/2020   Sjogren's disease (HCC) 05/23/2020   Primary osteoarthritis of both  hands 05/23/2020   Primary osteoarthritis of both feet 05/23/2020   Sjogren's syndrome (HCC) 05/23/2020   Other secondary scoliosis, lumbar region 12/22/2019   Spondylosis without myelopathy or radiculopathy, lumbar region 12/22/2019   Cervical radiculopathy 10/18/2019   Numbness 09/21/2019   Bilateral carpal tunnel syndrome 09/21/2019   High risk medication use 09/21/2019   Memory loss 09/21/2019   Essential hypertension 08/25/2018   Abnormal SPEP 02/19/2018   Hand pain 02/20/2017   Multiple joint pain 02/18/2017   Disturbed cognition 06/25/2016   Impaired cognition 06/25/2016   Sciatica, right side 10/30/2015    Chronic fatigue 08/04/2014   Gait disturbance 08/04/2014   Unspecified visual disturbance 01/31/2013   Transient vision disturbance 01/31/2013   Colon polyps 11/03/2011   POSTHERPETIC NEURALGIA 10/03/2009   DISPLCMT LUMBAR INTERVERT DISC W/O MYELOPATHY 09/05/2009   RENAL CALCULUS, RECURRENT 09/04/2009   Hyperlipidemia 08/31/2009   Multiple sclerosis (HCC) 08/31/2009   ALLERGIC RHINITIS 08/31/2009  Progress Note Reporting Period10/09/2022 to 07/23/2023  See note below for Objective Data and Assessment of Progress/Goals.     PCP: Roxan Feast NP  REFERRING PROVIDER: Dr Darryle Running   REFERRING DIAG: Low Back Pain   Rationale for Evaluation and Treatment: Rehabilitation  THERAPY DIAG:  Difficulty in walking, not elsewhere classified  Other low back pain  Cramp and spasm  Muscle weakness (generalized)  ONSET DATE: About 11 days ago her nerve abiliation wore off.   SUBJECTIVE:                                                                                                                                                                                           SUBJECTIVE STATEMENT: Reports the following trigger point dry needling last visit helped significantly.  Significant improvement in low back pain following needling and manual therapy.  She reports her back is still hurting but is better.  She continues to have pain in her hips.  She is can see the MD about injections today for her hips.  PERTINENT HISTORY:  Right Hip replacement 2022, Low back Pain, MS, TIA October 2023; MI, November 2022; CAD, Sjorgens syndrome  PAIN:  Are you having pain? Yes: NPRS scale: 6/10 Pain location left side lumbar spine Pain description: aching  Aggravating factors: standing and walking  Relieving factors: rest    PRECAUTIONS: None  WEIGHT BEARING RESTRICTIONS: No  FALLS:  Has patient fallen in last 6 months? 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   LUMBAR ROM:   AROM eval 5/15 10/3  Flexion Limited 50 % with pain  Mild improvement but still about 50%  Improved pain but still limited  Extension No limit     Right lateral flexion     Left lateral flexion     Right rotation Limited 50% with pain     Left rotation Limited 50% with pain      (Blank rows = not tested)  LOWER EXTREMITY ROM:     Passive  Right eval Left eval Right  Right  7/24 Right 1/2  Hip flexion 105 90 105 110 with pain at end range  100 with pain   Hip extension   10 degrees from neutral before manual today. Too neutral after stretching     Hip abduction       Hip adduction       Hip internal rotation painful Painful  Painful  Full with less pain  Painful   Hip external rotation   20 degrees with pain  45 with pain  48 with pain   Knee flexion       Knee extension       Ankle dorsiflexion       Ankle plantarflexion       Ankle inversion       Ankle eversion        (Blank rows = not tested)  LOWER EXTREMITY MMT:    MMT Right eval Left eval Right  3/26 Left  3/26 Right 5/15 Left 5/15 Right 7/24 Left 7/24 Right  10/2 Left 10/2 Left 1/2 Right  1/2  Hip flexion 13.1 12.3 15.1 26.1 18.2 26.2 11.3 19.1 15.3 23.4 16.7 23.4  Hip extension              Hip abduction 11.5 10.1 13.7 24.4 15.9 24 12.6 17.8  23.8 28.5 25.6 27.9  Hip adduction              Hip internal rotation              Hip external rotation              Knee flexion              Knee extension 14.0 13.3 17.5 33.3 20 38.2 19.4 18.3 23.8 23.6 22.6 24.8  Ankle dorsiflexion              Ankle plantarflexion              Ankle inversion              Ankle eversion               (Blank rows = not tested)  FUNCTIONAL TESTS:  5 times sit to stand: 28 sec  5/15 5 times sit to stand test 20 seconds 7/25 24 sec  10/16 17 sec 1/217 seconds  3-minute walk test 150 feet without requirement of seated rest break 5/15  7/25 3 min  walk test 200'   10/16 3 min walk test 379'   Will assess next visit secondary to time  GAIT:  TODAY'S TREATMENT:                                                                                                                              DATE:  2/6 Subsequent Treatment: Instructions provided previously at initial dry needling treatment.  Instructions reviewed, if requested by the patient, prior to subsequent dry needling treatment.   Patient Verbal Consent Given: Yes Education Handout Provided: Previously Provided Muscles Treated: right gluteal x3 using a .30x650 needle. Right lumbar paraspinals at L1-L3 with care not to go into area of previous laminectomy. + Electrical Stimulation Performed: No Treatment Response/Outcome: great twitch   Manual: trigger point release to gluteal and lower lumbar spine with focus on the left side.. Trigger point release to hips and spine; Roller to anterior hip.   There-ex:  LTR x20  Hip abduction 2x15 red   Neuro-re-ed Row with abdominal breathing 3 x 10 red Extension 3 x 10 red with abdominal breathing     1/29 Trigger Point Dry Needling  Subsequent Treatment: Instructions provided previously at initial dry needling treatment.  Instructions reviewed, if requested by the patient, prior to subsequent dry needling treatment.   Patient Verbal  Consent Given: Yes Education Handout Provided: Previously Provided Muscles Treated: right gluteal x3 using a .30x650 needle. Right lumbar paraspinals at L1-L3 with care not to go into area of previous laminectomy. + Electrical Stimulation Performed: No Treatment Response/Outcome: great twitch     Manual: trigger point release to gluteal and lower lumbar spine with focus on the left side.. Trigger point release to hips and spine; Roller to anterior hip.     LTR 2x20  SAQ 3 x 15 each leg Supine hip abduction 3 x 15 red band 1/15  Manual: trigger point release to gluteal and lower lumbar spine with focus on the left side.. Trigger point release to hips and spine; Roller to anterior hip.   LTR 2x20  SAQ 3 x 15 each leg Supine hip abduction 3 x 15 red band Trigger Point Dry Needling  Subsequent Treatment: Instructions provided previously at initial dry needling treatment.  Instructions reviewed, if requested by the patient, prior to subsequent dry needling treatment.   Patient Verbal Consent Given: Yes Education Handout Provided: Previously Provided Muscles Treated: right gluteal x3 using a .30x650 needle.  Electrical Stimulation Performed: No Treatment Response/Outcome: great twitch     1/2 Manual: trigger point release to gluteal and lower lumbar spine with focus on the left side.. Trigger point release to hips and spine; Roller to anterior hip.   LTR 2x20  SAQ 3 x 15 each leg Supine hip abduction 3 x 15 red band  LAQ 2 x 15  Standing: Squat to heel raise 3 x 10  Reviewed strength and functional measurements  1/9 Manual: trigger point release to gluteal and  lower lumbar spine with focus on the left side.. Trigger point release to hips and spine; Roller to anterior hip.   Supine marches 3x10  Hip adduction ball squeezes 2x15 Supine Clam shells 2x12 GTB  Seated LAQ red 2x15 each leg Quad   12/18  Manual: trigger point release to gluteal and lower lumbar spine  with focus on the left side.. Trigger point release to hips and spine; Roller to anterior hip.   Supine marches 3x10  Hip adduction ball squeezes 2x15 Supine Clam shells 2x12 GTB  Seated LAQ red 2x15 each leg  12/11  Manual: trigger point release to gluteal and lower lumbar spine with focus on the left side.. Trigger point release to hips and spine; Roller to anterior hip.   Seated march red band 3 x 15 Seated hip adduction with ball 3 x 15 Seated clamshell red 3 x 15 Seated LAQ red band 3 x 12 bilateral.  12/4 Manual: trigger point release to gluteal and lower lumbar spine. Trigger point release to hips and spine; Roller to anterior hip.    Supine marches 3x10  Hip adduction ball squeezes 2x15 Supine Clam shells 2x12 GTB  Seated LAQ red 2x15 each leg  Standing weight shifts x20 Standing low march 2x10    11/30 Manual: trigger point release to gluteal and lower lumbar spine. Trigger point release to hips and spine; Roller to anterior hip.   Supine marches 3x10  Hip adduction ball squeezes 2x15 Supine Clam shells 2x12 GTB  Standing weight shifts on airex pad x20  11/20 Nu-step 5 min L5  Ball roll out fwd x10  Ball roll out lateral x10 each direction  [employer] Green 3x15  Shoulder extension 3x15 green   Bilateral er red 3x15  Horizontal abduction 3x15 red  Bicpes curl 3lbs 3x15 red   All with cuing for abdominal breathing.         PATIENT EDUCATION:  Education details: reviewed HEP, symptom management  Person educated: Patient Education method: Explanation, Demonstration, Tactile cues, Verbal cues, and Handouts Education comprehension: verbalized understanding, returned demonstration, verbal cues required, tactile cues required, and needs further education  HOME EXERCISE PROGRAM: Has previous HEP. Will review next visit  ASSESSMENT:  CLINICAL IMPRESSION: The patient had much of a twitch response today.  She continues to tolerate  treatment well.  We added standing postural and core strengthening exercises today.  She reported feeling a good core contraction with the exercise.  She reports since her last visit she has had an improved ability to stand and walk secondary to low back pain.  She sees her MD today for potential injections in her hips.  She would benefit from further skilled therapy to continue pain reduction techniques as well as progressive exercise. OBJECTIVE IMPAIRMENTS: Abnormal gait, decreased activity tolerance, decreased balance, decreased endurance, decreased knowledge of use of DME, decreased mobility, difficulty walking, decreased ROM, decreased strength, increased muscle spasms, and pain.   ACTIVITY LIMITATIONS: carrying, lifting, bending, standing, squatting, sleeping, stairs, transfers, and locomotion level  PARTICIPATION LIMITATIONS: meal prep, cleaning, laundry, driving, shopping, community activity, and yard work  PERSONAL FACTORS: Right Hip replacement 2022, Low back Pain, MS, TIA October 2023; MI, November 2022; CAD, Sjorgens syndrome are also affecting patient's functional outcome. MS  REHAB POTENTIAL: Good  CLINICAL DECISION MAKING: Evolving/moderate complexity declining mobility   EVALUATION COMPLEXITY: Moderate   GOALS: Goals reviewed with patient? No  SHORT TERM GOALS: Target date: 09/29/2022    Patient will  increase gross bilateral lower extremity strength by 5 pounds Baseline: Goal status: has returned to baseline from a few progress notes ago 10/16  2.  Patient will increase lumbar flexion by 10 degrees Baseline:  Goal status:  mild improvement but still painful 10/16  3.  Patient will be independent with basic after exercise program and stretching program Baseline:  Goal status: has basic HEP achieved 10/16   LONG TERM GOALS: Target date: 09/29/2022     Patient will stand for greater than 30 minutes without increased pain Baseline:  Goal status: improving 5/15   continued improvement 10/16   2.  Patient will pick item up off the floor without increased low back pain Baseline:  Goal status:still having pain 5/15 improved but still painful 10/16   3.  Patient will ambulate community distances without report of increased low back pain Baseline:  Goal status:limited by pain but maintaining 7/25  4.  Patient will report improved ability to sleep through the night secondary to back pain Baseline:  Goal status: continues to have pain at night 7/25    PLAN:  PT FREQUENCY: 1x/week  PT DURATION: 10 weeks   PLANNED INTERVENTIONS: Therapeutic exercises, Therapeutic activity, Neuromuscular re-education, Balance training, Gait training, Patient/Family education, Self Care, Joint mobilization, Stair training, DME instructions, Aquatic Therapy, Dry Needling, Spinal mobilization, Cryotherapy, Moist heat, Ionotophoresis 4mg /ml Dexamethasone , and Manual therapy.  PLAN FOR NEXT SESSION: Consider manual therapy to the back and hips, consider trigger point dry needling to back and hips.  Reviewed basic HEP.  Begin active strengthening of hips and core.  Begin gait training and functional mobility training.       Alm JINNY Don, PT 10/28/2022, 11:41 AM

## 2023-08-27 ENCOUNTER — Encounter (HOSPITAL_BASED_OUTPATIENT_CLINIC_OR_DEPARTMENT_OTHER): Payer: Self-pay | Admitting: Physical Therapy

## 2023-09-02 ENCOUNTER — Ambulatory Visit (HOSPITAL_BASED_OUTPATIENT_CLINIC_OR_DEPARTMENT_OTHER): Payer: Medicare Other | Admitting: Physical Therapy

## 2023-09-02 ENCOUNTER — Encounter (HOSPITAL_BASED_OUTPATIENT_CLINIC_OR_DEPARTMENT_OTHER): Payer: Self-pay | Admitting: Physical Therapy

## 2023-09-02 DIAGNOSIS — R262 Difficulty in walking, not elsewhere classified: Secondary | ICD-10-CM

## 2023-09-02 DIAGNOSIS — M5459 Other low back pain: Secondary | ICD-10-CM | POA: Diagnosis not present

## 2023-09-02 DIAGNOSIS — M6281 Muscle weakness (generalized): Secondary | ICD-10-CM | POA: Diagnosis not present

## 2023-09-02 DIAGNOSIS — R252 Cramp and spasm: Secondary | ICD-10-CM

## 2023-09-02 NOTE — Therapy (Signed)
OUTPATIENT PHYSICAL THERAPY THORACOLUMBAR Treatment/Progress note    Patient Name: Dawn [name] MRN: [id number] DOB:07/26/1945, 78 y.o., female Today's Date: 09/02/2023  END OF SESSION:  PT End of Session - 09/02/23 1019     Visit Number 47    Number of Visits 52    Date for PT Re-Evaluation 10/01/23    Authorization Type progress note at 52    PT Start Time 1013    PT Stop Time 1056    PT Time Calculation (min) 43 min    Activity Tolerance Patient tolerated treatment well    Behavior During Therapy [name] for tasks assessed/performed                      Past Medical History:  Diagnosis Date   Allergy    CAD (coronary artery disease)    s/p DES to RCA in 06/2021   Dry eyes    Endometrial polyp    Essential hypertension 08/25/2018   GERD (gastroesophageal reflux disease)    History of hiatal hernia    History of kidney stones    Hot flashes, menopausal 10/13/2011   Estradiol started    Hyperlipidemia    Jaundice as teenager   no problems since   Memory loss 09/21/2019     1/2 of feet numb all the time   Multiple sclerosis (HCC) dx 2001   Neuropathy    bilateral feet   Osteoarthritis    Sjogren's syndrome (HCC) dx oct 2021   sore muscvles, dry mouth and eyes   Stroke (HCC)    Vertigo    Vision abnormalities    Past Surgical History:  Procedure Laterality Date   ABDOMINAL HYSTERECTOMY  2002   partial   COLONOSCOPY  01/27/2022   2 day prep   colonscopy  2011   CORONARY STENT INTERVENTION N/A 07/18/2021   Procedure: CORONARY STENT INTERVENTION;  Surgeon: Corky Crafts, MD;  Location: [facility];  Service: Cardiovascular;  Laterality: N/A;   DILATATION & CURETTAGE/HYSTEROSCOPY WITH MYOSURE N/A 06/08/2020   Procedure: DILATATION & CURETTAGE/HYSTEROSCOPY/Polypectomy WITH MYOSURE;  Surgeon: Toy Baker, DO;  Location: [facility];  Service: Gynecology;  Laterality: N/A;   LEFT HEART CATH AND CORONARY ANGIOGRAPHY N/A  07/18/2021   Procedure: LEFT HEART CATH AND CORONARY ANGIOGRAPHY;  Surgeon: Corky Crafts, MD;  Location: [facility] INVASIVE CV [facility];  Service: Cardiovascular;  Laterality: N/A;   LUMBAR FUSION  2001   TOTAL HIP ARTHROPLASTY Right 01/17/2021   Procedure: TOTAL HIP ARTHROPLASTY ANTERIOR APPROACH;  Surgeon: Samson Frederic, MD;  Location: [facility];  Service: Orthopedics;  Laterality: Right;   UPPER GI ENDOSCOPY  yrs ago   Patient Active Problem List   Diagnosis Date Noted   UTI (urinary tract infection) 07/09/2023   OAB (overactive bladder) 07/03/2023   Statin myopathy 06/26/2023   History of CVA (cerebrovascular accident) 06/26/2023   Rheumatoid arthritis involving right hand, unspecified whether rheumatoid factor present (HCC) 05/22/2023   Atherosclerotic heart disease of native coronary artery with other forms of angina pectoris (HCC) 05/22/2023   Positive colorectal cancer screening using Cologuard test 11/14/2021   Coronary artery disease    Statin intolerance 04/19/2021   Osteoarthritis of right hip 01/17/2021   Status post THR (total hip replacement) 01/17/2021   Chronic bilateral low back pain without sciatica 10/02/2020   Chronic pain syndrome 06/20/2020   Post laminectomy syndrome 06/20/2020   Sjogren's disease (HCC) 05/23/2020   Primary osteoarthritis of both  hands 05/23/2020   Primary osteoarthritis of both feet 05/23/2020   Sjogren's syndrome (HCC) 05/23/2020   Other secondary scoliosis, lumbar region 12/22/2019   Spondylosis without myelopathy or radiculopathy, lumbar region 12/22/2019   Cervical radiculopathy 10/18/2019   Numbness 09/21/2019   Bilateral carpal tunnel syndrome 09/21/2019   High risk medication use 09/21/2019   Memory loss 09/21/2019   Essential hypertension 08/25/2018   Abnormal SPEP 02/19/2018   Hand pain 02/20/2017   Multiple joint pain 02/18/2017   Disturbed cognition 06/25/2016   Impaired cognition 06/25/2016   Sciatica, right side 10/30/2015    Chronic fatigue 08/04/2014   Gait disturbance 08/04/2014   Unspecified visual disturbance 01/31/2013   Transient vision disturbance 01/31/2013   Colon polyps 11/03/2011   POSTHERPETIC NEURALGIA 10/03/2009   DISPLCMT LUMBAR INTERVERT DISC W/O MYELOPATHY 09/05/2009   RENAL CALCULUS, RECURRENT 09/04/2009   Hyperlipidemia 08/31/2009   Multiple sclerosis (HCC) 08/31/2009   ALLERGIC RHINITIS 08/31/2009  Progress Note Reporting Period10/09/2022 to 07/23/2023  See note below for Objective Data and Assessment of Progress/Goals.     PCP: Iantha Fallen NP  REFERRING PROVIDER: Dr Romero Belling   REFERRING DIAG: Low Back Pain   Rationale for Evaluation and Treatment: Rehabilitation  THERAPY DIAG:  Difficulty in walking, not elsewhere classified  Other low back pain  Cramp and spasm  Muscle weakness (generalized)  ONSET DATE: About 11 days ago her nerve abiliation wore off.   SUBJECTIVE:                                                                                                                                                                                           SUBJECTIVE STATEMENT:  The patient has had her hip injections.  She feels like they helped significantly.  She has had very little pain over the last few days.  She will have injections in her back soon  PERTINENT HISTORY:  Right Hip replacement 2022, Low back Pain, MS, TIA October 2023; MI, November 2022; CAD, Sjorgens syndrome  PAIN:  Are you having pain? Yes: NPRS scale: 6/10 Pain location left side lumbar spine Pain description: aching  Aggravating factors: standing and walking  Relieving factors: rest    PRECAUTIONS: None  WEIGHT BEARING RESTRICTIONS: No  FALLS:  Has patient fallen in last 6 months? 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   LUMBAR ROM:   AROM eval 5/15 10/3  Flexion Limited 50 % with pain  Mild improvement but still about 50%  Improved pain but still limited  Extension No limit     Right lateral flexion     Left lateral flexion     Right rotation Limited 50% with pain     Left rotation Limited 50% with pain      (Blank rows = not tested)  LOWER EXTREMITY ROM:     Passive  Right eval Left eval Right  Right  7/24 Right 1/2  Hip flexion 105 90 105 110 with pain at end range  100 with pain   Hip extension   10 degrees from neutral before manual today. Too neutral after stretching     Hip abduction       Hip adduction       Hip internal rotation painful Painful  Painful  Full with less pain  Painful   Hip external rotation   20 degrees with pain  45 with pain  48 with pain   Knee flexion       Knee extension       Ankle dorsiflexion       Ankle plantarflexion       Ankle inversion       Ankle eversion        (Blank rows = not tested)  LOWER EXTREMITY MMT:    MMT Right eval Left eval Right  3/26 Left  3/26 Right 5/15 Left 5/15 Right 7/24 Left 7/24 Right  10/2 Left 10/2 Left 1/2 Right  1/2  Hip flexion 13.1 12.3 15.1 26.1 18.2 26.2 11.3 19.1 15.3 23.4 16.7 23.4  Hip extension              Hip abduction 11.5 10.1 13.7 24.4 15.9 24 12.6 17.8 23.8 28.5 25.6 27.9  Hip adduction              Hip internal rotation              Hip external rotation              Knee flexion               Knee extension 14.0 13.3 17.5 33.3 20 38.2 19.4 18.3 23.8 23.6 22.6 24.8  Ankle dorsiflexion              Ankle plantarflexion              Ankle inversion              Ankle eversion               (Blank rows = not tested)  FUNCTIONAL TESTS:  5 times sit to stand: 28 sec  5/15 5 times sit to stand test 20 seconds 7/25 24 sec  10/16 17 sec 1/217 seconds  3-minute walk test 150 feet without requirement of seated rest break 5/15  7/25 3 min walk test 200'   10/16 3 min walk test 379'   Will assess next visit secondary to time  GAIT:  TODAY'S TREATMENT:                                                                                                                              DATE:  2/12  There-ex   Nu-step 5 min L5  Ball roll out fwd x10  Ball roll out lateral x10 each direction  [employer] x20    Neruo re-ed :   cuing for posture with all  Bilateral er red 3x15  Horizontal abduction 3x15 red  Bicpes curl 3lbs 3x15 red   Supine march with TA 3x10  Supine ball squeeze with TA 3x10 Supine hip abduction with TA 3x10   2/6 Subsequent Treatment: Instructions provided previously at initial dry needling treatment.  Instructions reviewed, if requested by the patient, prior to subsequent dry needling treatment.   Patient Verbal Consent Given: Yes Education Handout Provided: Previously Provided Muscles Treated: right gluteal x3 using a .30x650 needle. Right lumbar paraspinals at L1-L3 with care not to go into area of previous laminectomy. + Electrical Stimulation Performed: No Treatment Response/Outcome: great twitch   Manual: trigger point release to gluteal and lower lumbar spine with focus on the left side.. Trigger point release to hips and spine; Roller to anterior hip.   There-ex:  LTR x20  Hip abduction 2x15 red   Neuro-re-ed Row with abdominal breathing 3 x 10 red Extension 3 x 10 red with abdominal breathing     1/29 Trigger Point  Dry Needling  Subsequent Treatment: Instructions provided previously at initial dry needling treatment.  Instructions reviewed, if requested by the patient, prior to subsequent dry needling treatment.   Patient Verbal Consent Given: Yes Education Handout Provided: Previously Provided Muscles Treated: right gluteal x3 using a .30x650 needle. Right lumbar paraspinals at L1-L3 with care not to go into area of previous laminectomy. + Electrical Stimulation Performed: No Treatment Response/Outcome: great twitch     Manual: trigger point release to gluteal and lower lumbar spine with focus on the left side.. Trigger point release to hips and spine; Roller to anterior hip.     LTR 2x20  SAQ 3 x 15 each leg Supine hip abduction 3 x 15 red band 1/15  Manual: trigger point release to gluteal and lower lumbar spine with focus on the left side.. Trigger point release to hips and spine; Roller to anterior hip.   LTR 2x20  SAQ 3 x 15 each leg Supine hip abduction 3 x 15 red band Trigger Point Dry Needling  Subsequent Treatment: Instructions provided previously at initial dry needling treatment.  Instructions reviewed, if requested by the patient, prior to subsequent dry needling treatment.   Patient Verbal Consent Given: Yes Education Handout Provided: Previously Provided Muscles Treated: right gluteal x3 using a .30x650 needle.  Electrical Stimulation Performed: No Treatment Response/Outcome: great twitch     1/2 Manual: trigger point release to gluteal and lower lumbar spine with focus on the left side.. Trigger point  release to hips and spine; Roller to anterior hip.   LTR 2x20  SAQ 3 x 15 each leg Supine hip abduction 3 x 15 red band  LAQ 2 x 15  Standing: Squat to heel raise 3 x 10  Reviewed strength and functional measurements  1/9 Manual: trigger point release to gluteal and lower lumbar spine with focus on the left side.. Trigger point release to hips and spine; Roller  to anterior hip.   Supine marches 3x10  Hip adduction ball squeezes 2x15 Supine Clam shells 2x12 GTB  Seated LAQ red 2x15 each leg Quad   12/18  Manual: trigger point release to gluteal and lower lumbar spine with focus on the left side.. Trigger point release to hips and spine; Roller to anterior hip.   Supine marches 3x10  Hip adduction ball squeezes 2x15 Supine Clam shells 2x12 GTB  Seated LAQ red 2x15 each leg  12/11  Manual: trigger point release to gluteal and lower lumbar spine with focus on the left side.. Trigger point release to hips and spine; Roller to anterior hip.   Seated march red band 3 x 15 Seated hip adduction with ball 3 x 15 Seated clamshell red 3 x 15 Seated LAQ red band 3 x 12 bilateral.  12/4 Manual: trigger point release to gluteal and lower lumbar spine. Trigger point release to hips and spine; Roller to anterior hip.    Supine marches 3x10  Hip adduction ball squeezes 2x15 Supine Clam shells 2x12 GTB  Seated LAQ red 2x15 each leg  Standing weight shifts x20 Standing low march 2x10    11/30 Manual: trigger point release to gluteal and lower lumbar spine. Trigger point release to hips and spine; Roller to anterior hip.   Supine marches 3x10  Hip adduction ball squeezes 2x15 Supine Clam shells 2x12 GTB  Standing weight shifts on airex pad x20  11/20 Nu-step 5 min L5  Ball roll out fwd x10  Ball roll out lateral x10 each direction  [employer] Green 3x15  Shoulder extension 3x15 green   Bilateral er red 3x15  Horizontal abduction 3x15 red  Bicpes curl 3lbs 3x15 red   All with cuing for abdominal breathing.         PATIENT EDUCATION:  Education details: reviewed HEP, symptom management  Person educated: Patient Education method: Explanation, Demonstration, Tactile cues, Verbal cues, and Handouts Education comprehension: verbalized understanding, returned demonstration, verbal cues required, tactile cues  required, and needs further education  HOME EXERCISE PROGRAM: Has previous HEP. Will review next visit  ASSESSMENT:  CLINICAL IMPRESSION:  Patient tolerated treatment well.  She had very little pain today.  On days that she does have pain we will focus more on strengthening activity.  She is advised to watch her symptoms closely.  We do not want to exacerbate her symptoms.We worked on both UE and LE strengthening. We reviewed her HE for home. She would beneift from further skilled therapy to improve ability to perfrom ADL's and for skilled progression of activity.   OBJECTIVE IMPAIRMENTS: Abnormal gait, decreased activity tolerance, decreased balance, decreased endurance, decreased knowledge of use of DME, decreased mobility, difficulty walking, decreased ROM, decreased strength, increased muscle spasms, and pain.   ACTIVITY LIMITATIONS: carrying, lifting, bending, standing, squatting, sleeping, stairs, transfers, and locomotion level  PARTICIPATION LIMITATIONS: meal prep, cleaning, laundry, driving, shopping, community activity, and yard work  PERSONAL FACTORS: Right Hip replacement 2022, Low back Pain, MS, TIA October 2023; MI, November 2022;  CAD, Sjorgens syndrome are also affecting patient's functional outcome. MS  REHAB POTENTIAL: Good  CLINICAL DECISION MAKING: Evolving/moderate complexity declining mobility   EVALUATION COMPLEXITY: Moderate   GOALS: Goals reviewed with patient? No  SHORT TERM GOALS: Target date: 09/29/2022    Patient will increase gross bilateral lower extremity strength by 5 pounds Baseline: Goal status: has returned to baseline from a few progress notes ago 10/16  2.  Patient will increase lumbar flexion by 10 degrees Baseline:  Goal status:  mild improvement but still painful 10/16  3.  Patient will be independent with basic after exercise program and stretching program Baseline:  Goal status: has basic HEP achieved 10/16   LONG TERM GOALS:  Target date: 09/29/2022     Patient will stand for greater than 30 minutes without increased pain Baseline:  Goal status: improving 5/15  continued improvement 10/16   2.  Patient will pick item up off the floor without increased low back pain Baseline:  Goal status:still having pain 5/15 improved but still painful 10/16   3.  Patient will ambulate community distances without report of increased low back pain Baseline:  Goal status:limited by pain but maintaining 7/25  4.  Patient will report improved ability to sleep through the night secondary to back pain Baseline:  Goal status: continues to have pain at night 7/25    PLAN:  PT FREQUENCY: 1x/week  PT DURATION: 10 weeks   PLANNED INTERVENTIONS: Therapeutic exercises, Therapeutic activity, Neuromuscular re-education, Balance training, Gait training, Patient/Family education, Self Care, Joint mobilization, Stair training, DME instructions, Aquatic Therapy, Dry Needling, Spinal mobilization, Cryotherapy, Moist heat, Ionotophoresis 4mg /ml Dexamethasone, and Manual therapy.  PLAN FOR NEXT SESSION: Consider manual therapy to the back and hips, consider trigger point dry needling to back and hips.  Reviewed basic HEP.  Begin active strengthening of hips and core.  Begin gait training and functional mobility training.       Dessie Coma, PT 10/28/2022, 10:41 AM

## 2023-09-07 ENCOUNTER — Telehealth: Payer: Self-pay

## 2023-09-07 NOTE — Telephone Encounter (Signed)
Spoke with patient, patient states [facility] [employer] is requesting that we complete a PA for [employer]. Eston Mould and they said they fax PA paperwork over

## 2023-09-07 NOTE — Telephone Encounter (Signed)
Message left under clinical intake voicemail, patient would like a return call to discuss [facility] rx refill.   Left message on voicemail for patient to return call when available

## 2023-09-09 ENCOUNTER — Ambulatory Visit (HOSPITAL_BASED_OUTPATIENT_CLINIC_OR_DEPARTMENT_OTHER): Payer: Medicare Other | Admitting: Physical Therapy

## 2023-09-11 NOTE — Telephone Encounter (Signed)
Paperwork received and placed in Dawn [name]'s review and sign folder. Once Dawn [name] completes paperwork will be given to the administrative staff to further process

## 2023-09-15 DIAGNOSIS — M47816 Spondylosis without myelopathy or radiculopathy, lumbar region: Secondary | ICD-10-CM | POA: Diagnosis not present

## 2023-09-15 NOTE — Telephone Encounter (Signed)
 [facility] prior Authorization Paper work completed given to front desk Adm. To fax as requested.

## 2023-09-16 ENCOUNTER — Encounter (HOSPITAL_BASED_OUTPATIENT_CLINIC_OR_DEPARTMENT_OTHER): Payer: Self-pay | Admitting: Physical Therapy

## 2023-09-16 ENCOUNTER — Ambulatory Visit (HOSPITAL_BASED_OUTPATIENT_CLINIC_OR_DEPARTMENT_OTHER): Payer: Medicare Other | Admitting: Physical Therapy

## 2023-09-16 DIAGNOSIS — R252 Cramp and spasm: Secondary | ICD-10-CM

## 2023-09-16 DIAGNOSIS — M6281 Muscle weakness (generalized): Secondary | ICD-10-CM | POA: Diagnosis not present

## 2023-09-16 DIAGNOSIS — R262 Difficulty in walking, not elsewhere classified: Secondary | ICD-10-CM | POA: Diagnosis not present

## 2023-09-16 DIAGNOSIS — M5459 Other low back pain: Secondary | ICD-10-CM

## 2023-09-16 NOTE — Therapy (Signed)
 OUTPATIENT PHYSICAL THERAPY THORACOLUMBAR Treatment/Progress note    Patient Name: Dawn [name] MRN: [id number] DOB:03-01-1946, 78 y.o., female Today's Date: 09/16/2023  END OF SESSION:  PT End of Session - 09/16/23 1055     Visit Number 48    Number of Visits 52    Date for PT Re-Evaluation 10/01/23    Authorization Type progress note at 52    PT Start Time 1015    PT Stop Time 1059    PT Time Calculation (min) 44 min    Activity Tolerance Patient tolerated treatment well    Behavior During Therapy [facility] for tasks assessed/performed                      Past Medical History:  Diagnosis Date   Allergy    CAD (coronary artery disease)    s/p DES to RCA in 06/2021   Dry eyes    Endometrial polyp    Essential hypertension 08/25/2018   GERD (gastroesophageal reflux disease)    History of hiatal hernia    History of kidney stones    Hot flashes, menopausal 10/13/2011   Estradiol started    Hyperlipidemia    Jaundice as teenager   no problems since   Memory loss 09/21/2019     1/2 of feet numb all the time   Multiple sclerosis (HCC) dx 2001   Neuropathy    bilateral feet   Osteoarthritis    Sjogren's syndrome (HCC) dx oct 2021   sore muscvles, dry mouth and eyes   Stroke (HCC)    Vertigo    Vision abnormalities    Past Surgical History:  Procedure Laterality Date   ABDOMINAL HYSTERECTOMY  2002   partial   COLONOSCOPY  01/27/2022   2 day prep   colonscopy  2011   CORONARY STENT INTERVENTION N/A 07/18/2021   Procedure: CORONARY STENT INTERVENTION;  Surgeon: Corky Crafts, MD;  Location: [facility];  Service: Cardiovascular;  Laterality: N/A;   DILATATION & CURETTAGE/HYSTEROSCOPY WITH MYOSURE N/A 06/08/2020   Procedure: DILATATION & CURETTAGE/HYSTEROSCOPY/Polypectomy WITH MYOSURE;  Surgeon: Toy Baker, DO;  Location: [facility];  Service: Gynecology;  Laterality: N/A;   LEFT HEART CATH AND CORONARY ANGIOGRAPHY N/A  07/18/2021   Procedure: LEFT HEART CATH AND CORONARY ANGIOGRAPHY;  Surgeon: Corky Crafts, MD;  Location: [facility] INVAS[facility] CV LAB;  Service: Cardiovascular;  Laterality: N/A;   LUMBAR FUSION  2001   TOTAL HIP ARTHROPLASTY Right 01/17/2021   Procedure: TOTAL HIP ARTHROPLASTY ANTERIOR APPROACH;  Surgeon: Samson Frederic, MD;  Location: [facility];  Service: Orthopedics;  Laterality: Right;   UPPER GI ENDOSCOPY  yrs ago   Patient Active Problem List   Diagnosis Date Noted   UTI (urinary tract infection) 07/09/2023   OAB (overactive bladder) 07/03/2023   Statin myopathy 06/26/2023   History of CVA (cerebrovascular accident) 06/26/2023   Rheumatoid arthritis involving right hand, unspecified whether rheumatoid factor present (HCC) 05/22/2023   Atherosclerotic heart disease of native coronary artery with other forms of angina pectoris (HCC) 05/22/2023   Positive colorectal cancer screening using Cologuard test 11/14/2021   Coronary artery disease    Statin intolerance 04/19/2021   Osteoarthritis of right hip 01/17/2021   Status post THR (total hip replacement) 01/17/2021   Chronic bilateral low back pain without sciatica 10/02/2020   Chronic pain syndrome 06/20/2020   Post laminectomy syndrome 06/20/2020   Sjogren's disease (HCC) 05/23/2020   Primary osteoarthritis of both  hands 05/23/2020   Primary osteoarthritis of both feet 05/23/2020   Sjogren's syndrome (HCC) 05/23/2020   Other secondary scoliosis, lumbar region 12/22/2019   Spondylosis without myelopathy or radiculopathy, lumbar region 12/22/2019   Cervical radiculopathy 10/18/2019   Numbness 09/21/2019   Bilateral carpal tunnel syndrome 09/21/2019   High risk medication use 09/21/2019   Memory loss 09/21/2019   Essential hypertension 08/25/2018   Abnormal SPEP 02/19/2018   Hand pain 02/20/2017   Multiple joint pain 02/18/2017   Disturbed cognition 06/25/2016   Impaired cognition 06/25/2016   Sciatica, right side 10/30/2015    Chronic fatigue 08/04/2014   Gait disturbance 08/04/2014   Unspecified visual disturbance 01/31/2013   Transient vision disturbance 01/31/2013   Colon polyps 11/03/2011   POSTHERPETIC NEURALGIA 10/03/2009   DISPLCMT LUMBAR INTERVERT DISC W/O MYELOPATHY 09/05/2009   RENAL CALCULUS, RECURRENT 09/04/2009   Hyperlipidemia 08/31/2009   Multiple sclerosis (HCC) 08/31/2009   ALLERGIC RHINITIS 08/31/2009  Progress Note Reporting Period10/09/2022 to 07/23/2023  See note below for Objective Data and Assessment of Progress/Goals.     PCP: Iantha Fallen NP  REFERRING PROVIDER: Dr Romero Belling   REFERRING DIAG: Low Back Pain   Rationale for Evaluation and Treatment: Rehabilitation  THERAPY DIAG:  Difficulty in walking, not elsewhere classified  Other low back pain  Cramp and spasm  Muscle weakness (generalized)  ONSET DATE: About 11 days ago her nerve abiliation wore off.   SUBJECTIVE:                                                                                                                                                                                           SUBJECTIVE STATEMENT: The patient is doing well. She is using the cane more. The pain in her hips has reduced significantly. She has been trying to move more now that the pain has improved. She will have an ablation in her back over th next few weeks.   PERTINENT HISTORY:  Right Hip replacement 2022, Low back Pain, MS, TIA October 2023; MI, November 2022; CAD, Sjorgens syndrome  PAIN:  Are you having pain? Yes: NPRS scale: 6/10 Pain location left side lumbar spine Pain description: aching  Aggravating factors: standing and walking  Relieving factors: rest    PRECAUTIONS: None  WEIGHT BEARING RESTRICTIONS: No  FALLS:  Has patient fallen in last 6 months? 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   LUMBAR ROM:   AROM eval 5/15 10/3  Flexion Limited 50 % with pain  Mild improvement but still about 50%  Improved pain but still limited  Extension No limit     Right lateral flexion     Left lateral flexion     Right rotation Limited 50% with pain     Left rotation Limited 50% with pain      (Blank rows = not tested)  LOWER EXTREMITY ROM:     Passive  Right eval Left eval Right  Right  7/24 Right 1/2  Hip flexion 105 90 105 110 with pain at end range  100 with pain   Hip extension   10 degrees from neutral before manual today. Too neutral after stretching     Hip abduction       Hip adduction       Hip internal rotation painful Painful  Painful  Full with less pain  Painful   Hip external rotation   20 degrees with pain  45 with pain  48 with pain   Knee flexion       Knee extension       Ankle dorsiflexion       Ankle plantarflexion       Ankle inversion       Ankle eversion        (Blank rows = not tested)  LOWER EXTREMITY MMT:    MMT Right eval Left eval Right  3/26 Left  3/26 Right 5/15 Left 5/15 Right 7/24 Left 7/24 Right  10/2 Left 10/2 Left 1/2 Right  1/2  Hip flexion 13.1 12.3 15.1 26.1 18.2 26.2 11.3 19.1 15.3 23.4 16.7 23.4  Hip extension              Hip abduction 11.5 10.1 13.7 24.4 15.9 24 12.6 17.8 23.8 28.5 25.6 27.9  Hip adduction              Hip internal rotation               Hip external rotation              Knee flexion              Knee extension 14.0 13.3 17.5 33.3 20 38.2 19.4 18.3 23.8 23.6 22.6 24.8  Ankle dorsiflexion              Ankle plantarflexion              Ankle inversion              Ankle eversion               (Blank rows = not tested)  FUNCTIONAL TESTS:  5 times sit to stand: 28 sec  5/15 5 times sit to stand test 20 seconds 7/25 24 sec  10/16 17 sec 1/217 seconds  3-minute walk test 150 feet without requirement of seated rest break 5/15  7/25 3 min walk test 200'   10/16 3 min walk test 379'  Will assess next visit secondary to time  GAIT:  TODAY'S TREATMENT:                                                                                                                              DATE:  2/26  There-ex: Nu-step 5 min L5  Ball roll out fwd x10  Ball roll out lateral x10 each direction  [employer] press x20   Neuro-re-ed  Row 3x10 red  Shoulder extnesion 3x10 red   Reviewed set up and rest   Step onto and off air-ex x20  Narrow base 3x30 sec hold  Heel/toe x20         2/12  There-ex   Nu-step 5 min L5  Ball roll out fwd x10  Ball roll out lateral x10 each direction  [employer] press x20    Neruo re-ed :   cuing for posture with all  Bilateral er red 3x15  Horizontal abduction 3x15 red  Bicpes curl 3lbs 3x15 red   Supine march with TA 3x10  Supine ball squeeze with TA 3x10 Supine hip abduction with TA 3x10   2/6 Subsequent Treatment: Instructions provided previously at initial dry needling treatment.  Instructions reviewed, if requested by the patient, prior to subsequent dry needling treatment.   Patient Verbal Consent Given: Yes Education Handout Provided: Previously Provided Muscles Treated: right gluteal x3 using a .30x650 needle. Right lumbar paraspinals at L1-L3 with care not to go into area of previous laminectomy. + Electrical Stimulation Performed: No Treatment Response/Outcome: great  twitch   Manual: trigger point release to gluteal and lower lumbar spine with focus on the left side.. Trigger point release to hips and spine; Roller to anterior hip.   There-ex:  LTR x20  Hip abduction 2x15 red   Neuro-re-ed Row with abdominal breathing 3 x 10 red Extension 3 x 10 red with abdominal breathing     1/29 Trigger Point Dry Needling  Subsequent Treatment: Instructions provided previously at initial dry needling treatment.  Instructions reviewed, if requested by the patient, prior to subsequent dry needling treatment.   Patient Verbal Consent Given: Yes Education Handout Provided: Previously Provided Muscles Treated: right gluteal x3 using a .30x650 needle. Right lumbar paraspinals at L1-L3 with care not to go into area of previous laminectomy. + Electrical Stimulation Performed: No Treatment Response/Outcome: great twitch     Manual: trigger point release to gluteal and lower lumbar spine with focus on the left side.. Trigger point release to hips and spine; Roller to anterior hip.     LTR 2x20  SAQ 3 x 15 each leg Supine hip abduction 3 x 15 red band 1/15  Manual: trigger point release to gluteal and lower lumbar spine with focus on the left side.. Trigger point release to hips and spine; Roller to anterior hip.   LTR 2x20  SAQ 3 x 15 each leg Supine hip abduction 3 x 15 red band Trigger Point Dry Needling  Subsequent Treatment: Instructions provided previously at initial dry needling treatment.  Instructions reviewed, if requested by the patient, prior to subsequent dry needling treatment.   Patient Verbal Consent Given: Yes Education Handout Provided: Previously Provided Muscles Treated: right gluteal x3 using a .30x650 needle.  Electrical Stimulation Performed: No Treatment Response/Outcome: great twitch     1/2 Manual: trigger point release to gluteal and lower lumbar spine with focus on the left side.. Trigger point release to hips and  spine; Roller to anterior hip.   LTR 2x20  SAQ 3 x 15 each leg Supine hip abduction 3 x 15 red band  LAQ 2 x 15  Standing: Squat to heel raise 3 x 10  Reviewed strength and functional measurements  1/9 Manual: trigger point release to gluteal and lower lumbar spine with focus on the left side.. Trigger point release to hips and spine; Roller to anterior hip.   Supine marches 3x10  Hip adduction ball squeezes 2x15 Supine Clam shells 2x12 GTB  Seated LAQ red 2x15 each leg Quad   12/18  Manual: trigger point release to gluteal and lower lumbar spine with focus on the left side.. Trigger point release to hips and spine; Roller to anterior hip.   Supine marches 3x10  Hip adduction ball squeezes 2x15 Supine Clam shells 2x12 GTB  Seated LAQ red 2x15 each leg  12/11  Manual: trigger point release to gluteal and lower lumbar spine with focus on the left side.. Trigger point release to hips and spine; Roller to anterior hip.   Seated march red band 3 x 15 Seated hip adduction with ball 3 x 15 Seated clamshell red 3 x 15 Seated LAQ red band 3 x 12 bilateral.  12/4 Manual: trigger point release to gluteal and lower lumbar spine. Trigger point release to hips and spine; Roller to anterior hip.    Supine marches 3x10  Hip adduction ball squeezes 2x15 Supine Clam shells 2x12 GTB  Seated LAQ red 2x15 each leg  Standing weight shifts x20 Standing low march 2x10    11/30 Manual: trigger point release to gluteal and lower lumbar spine. Trigger point release to hips and spine; Roller to anterior hip.   Supine marches 3x10  Hip adduction ball squeezes 2x15 Supine Clam shells 2x12 GTB  Standing weight shifts on airex pad x20  11/20 Nu-step 5 min L5  Ball roll out fwd x10  Ball roll out lateral x10 each direction  [employer] Green 3x15  Shoulder extension 3x15 green   Bilateral er red 3x15  Horizontal abduction 3x15 red  Bicpes curl 3lbs 3x15 red   All  with cuing for abdominal breathing.         PATIENT EDUCATION:  Education details: reviewed HEP, symptom management  Person educated: Patient Education method: Explanation, Demonstration, Tactile cues, Verbal cues, and Handouts Education comprehension: verbalized understanding, returned demonstration, verbal cues required, tactile cues required, and needs further education  HOME EXERCISE PROGRAM: Has previous HEP. Will review next visit  ASSESSMENT:  CLINICAL IMPRESSION:  The patient feels like since she has advanced to a cane her balance has been off. We worked on balance today. She required in a with narrow base on air-ex. She requires cuing to sow down and rest between her sets. We reviewed how to put the band in the door at home. While she is doing well, we will advance her strengthening and balance as much as possible. She was advised if she gets pain in  her hips or back to work on her stretches and soft tissue mobilization.  OBJECTIVE IMPAIRMENTS: Abnormal gait, decreased activity tolerance, decreased balance, decreased endurance, decreased knowledge of use of DME, decreased mobility, difficulty walking, decreased ROM, decreased strength, increased muscle spasms, and pain.   ACTIVITY LIMITATIONS: carrying, lifting, bending, standing, squatting, sleeping, stairs, transfers, and locomotion level  PARTICIPATION LIMITATIONS: meal prep, cleaning, laundry, driving, shopping, community activity, and yard work  PERSONAL FACTORS: Right Hip replacement 2022, Low back Pain, MS, TIA October 2023; MI, November 2022; CAD, Sjorgens syndrome are also affecting patient's functional outcome. MS  REHAB POTENTIAL: Good  CLINICAL DECISION MAKING: Evolving/moderate complexity declining mobility   EVALUATION COMPLEXITY: Moderate   GOALS: Goals reviewed with patient? No  SHORT TERM GOALS: Target date: 09/29/2022    Patient will increase gross bilateral lower extremity strength by 5  pounds Baseline: Goal status: has returned to baseline from a few progress notes ago 10/16  2.  Patient will increase lumbar flexion by 10 degrees Baseline:  Goal status:  mild improvement but still painful 10/16  3.  Patient will be independent with basic after exercise program and stretching program Baseline:  Goal status: has basic HEP achieved 10/16   LONG TERM GOALS: Target date: 09/29/2022     Patient will stand for greater than 30 minutes without increased pain Baseline:  Goal status: improving 5/15  continued improvement 10/16   2.  Patient will pick item up off the floor without increased low back pain Baseline:  Goal status:still having pain 5/15 improved but still painful 10/16   3.  Patient will ambulate community distances without report of increased low back pain Baseline:  Goal status:limited by pain but maintaining 7/25  4.  Patient will report improved ability to sleep through the night secondary to back pain Baseline:  Goal status: continues to have pain at night 7/25    PLAN:  PT FREQUENCY: 1x/week  PT DURATION: 10 weeks   PLANNED INTERVENTIONS: Therapeutic exercises, Therapeutic activity, Neuromuscular re-education, Balance training, Gait training, Patient/Family education, Self Care, Joint mobilization, Stair training, DME instructions, Aquatic Therapy, Dry Needling, Spinal mobilization, Cryotherapy, Moist heat, Ionotophoresis 4mg /ml Dexamethasone, and Manual therapy.  PLAN FOR NEXT SESSION: Consider manual therapy to the back and hips, consider trigger point dry needling to back and hips.  Reviewed basic HEP.  Begin active strengthening of hips and core.  Begin gait training and functional mobility training.       Dessie Coma, PT 10/28/2022, 12:54 PM

## 2023-09-21 NOTE — Telephone Encounter (Signed)
 Received fax from [facility] and [name] was DENIED.  Forwarded fax to PCP

## 2023-09-23 ENCOUNTER — Telehealth: Payer: Self-pay

## 2023-09-23 NOTE — Telephone Encounter (Signed)
 Please follow up with urologist to evaluate for any other option.Fesoterodin ER 24 hr recommended by [facility] but given adverse side effects of chest pain and OT prolongation  do not think this will be an option due to your history of Coronary artery disease,Aortic atherosclerosis and Hx of CVA.

## 2023-09-23 NOTE — Telephone Encounter (Signed)
 Copied from CRM [id number]. Topic: Clinical - Medical Advice >> Sep 23, 2023 10:49 AM Yvone Neu wrote: Reason for CRM: Patient is calling about a program to help her with her medications, patient callback number is [phone].   Spoke patient this morning because she was stating that the medication for Gemtesa needs to go through [facility] and Not [employer].  I did explained to the patient that the fax was received on yesterday from [facility] and [name] was denied and was Forward to [employer], [name], NP.  Please Advise  Message sent to [name], [name], NP

## 2023-09-23 NOTE — Telephone Encounter (Signed)
 Left message on voicemail for patient to return call when available

## 2023-09-24 DIAGNOSIS — M47816 Spondylosis without myelopathy or radiculopathy, lumbar region: Secondary | ICD-10-CM | POA: Diagnosis not present

## 2023-09-24 NOTE — Telephone Encounter (Signed)
 Noted.

## 2023-09-24 NOTE — Telephone Encounter (Addendum)
 Copied from CRM [id number]. Topic: Clinical - Medication Question >> Sep 24, 2023 12:47 PM Maree Krabbe H wrote: Reason for CRM: Patient was returning a call about her medication, patient states she will not be home between 2 and 3:30 but can call after that patients callback number is [phone].   Spoke with patient this afternoon to give her the update the response of [name], Dinah C, NP, Patient is stating that the paperwork needs to go [facility] for the PA to go through. I have explanined to the patient the paperwork from  [facility] it is in the chart and has been faxed and that she would need to give them a call. Patient does has made an appointment the urologist.  Message sent to [name], [name], NP

## 2023-09-28 ENCOUNTER — Encounter: Payer: Self-pay | Admitting: Family

## 2023-09-28 ENCOUNTER — Ambulatory Visit (INDEPENDENT_AMBULATORY_CARE_PROVIDER_SITE_OTHER): Admitting: Family

## 2023-09-28 VITALS — BP 122/70 | HR 65 | Temp 97.8°F | Resp 16 | Ht 62.0 in | Wt 187.0 lb

## 2023-09-28 DIAGNOSIS — N3281 Overactive bladder: Secondary | ICD-10-CM

## 2023-09-28 DIAGNOSIS — R350 Frequency of micturition: Secondary | ICD-10-CM | POA: Diagnosis not present

## 2023-09-28 LAB — POCT URINALYSIS DIPSTICK
Bilirubin, UA: POSITIVE
Glucose, UA: NEGATIVE
Ketones, UA: POSITIVE
Nitrite, UA: POSITIVE
Protein, UA: POSITIVE — AB
Spec Grav, UA: 1.015 (ref 1.010–1.025)
Urobilinogen, UA: NEGATIVE U/dL — AB
pH, UA: 5 (ref 5.0–8.0)

## 2023-09-28 NOTE — Progress Notes (Signed)
 Provider: Richarda Blade FNP-C  Tyrin Herbers, [name], NP  Patient Care Team: Jazariah Teall, [name], NP as PCP - General (Family Medicine) Maisie Fus, MD as PCP - Cardiology (Cardiology) Valeria Batman, MD (Inactive) as Consulting Physician (Orthopedic Surgery) [name], Pearletha Furl, MD (Neurology) Krista Blue, [place], OD (Optometry) Lynden Ang, NP as Nurse Practitioner (Obstetrics and Gynecology) Janalyn Harder, MD (Inactive) as Consulting Physician (Dermatology) Romero Belling, MD as Referring Physician (Physical Medicine and Rehabilitation)  Extended Emergency Contact Information Primary Emergency Contact: [facility] Phone: [phone] Mobile Phone: [phone] Relation: Friend Secondary Emergency Contact: Cranford,Jane Mobile Phone: [phone] Relation: Friend  Code Status:  Full code  Goals of care: Advanced Directive information    09/28/2023   10:26 AM  Advanced Directives  Does Patient Have a Medical Advance Directive? Yes  Type of Estate agent of [place];Living will;Out of facility DNR (pink MOST or yellow form)  Does patient want to make changes to medical advance directive? No - Patient declined  Copy of Healthcare Power of Attorney in Chart? Yes - validated most recent copy scanned in chart (See row information)     Chief Complaint  Patient presents with   Medical Management of Chronic Issues    Bladder medication (wants to try different one) Needs referral      Discussed the use of AI scribe software for clinical note transcription with the patient, who gave verbal consent to proceed.  History of Present Illness   The patient, with multiple sclerosis, presents with a request for a referral to a urogynecology's for overactive bladder management.  She has a history of multiple sclerosis since 2005, which has contributed to her bladder issues. She experiences symptoms of overactive bladder, including frequent urination, but  is not fully incontinent and can still make it to the bathroom. She also experiences some bladder tenderness.  She has been on various medications for overactive bladder, including oxybutynin 5 mg twice daily, VESIcare, Tamsulosin,Titerodine,Myrbetriq, and Gemtesa. She experienced side effects with some medications, such as dizziness and falling with Myrbetriq, and blood pressure drops with tamsulosin. She is currently using free samples of Gemtesa, which she finds effective in a different way, but it is not covered by her insurance. She recalls that VESIcare was effective without significant side effects, although it did not provide complete control over her symptoms.  She reports frequent urinary tract infections and suspects she might be developing another one. She has been using over-the-counter AZO and drinking pure cranberry juice to help with UTIs. She notes that [name] has slowed down her urinary frequency but is concerned it might be contributing to her UTIs.   Past Medical History:  Diagnosis Date   Allergy    CAD (coronary artery disease)    s/p DES to RCA in 06/2021   Dry eyes    Endometrial polyp    Essential hypertension 08/25/2018   GERD (gastroesophageal reflux disease)    History of hiatal hernia    History of kidney stones    Hot flashes, menopausal 10/13/2011   Estradiol started    Hyperlipidemia    Jaundice as teenager   no problems since   Memory loss 09/21/2019     1/2 of feet numb all the time   Multiple sclerosis (HCC) dx 2001   Neuropathy    bilateral feet   Osteoarthritis    Sjogren's syndrome (HCC) dx oct 2021   sore muscvles, dry mouth and eyes   Stroke (HCC)    Vertigo  Vision abnormalities    Past Surgical History:  Procedure Laterality Date   ABDOMINAL HYSTERECTOMY  2002   partial   COLONOSCOPY  01/27/2022   2 day prep   colonscopy  2011   CORONARY STENT INTERVENTION N/A 07/18/2021   Procedure: CORONARY STENT INTERVENTION;  Surgeon:  Corky Crafts, MD;  Location: [facility] INVASIVE CV LAB;  Service: Cardiovascular;  Laterality: N/A;   DILATATION & CURETTAGE/HYSTEROSCOPY WITH MYOSURE N/A 06/08/2020   Procedure: DILATATION & CURETTAGE/HYSTEROSCOPY/Polypectomy WITH MYOSURE;  Surgeon: Toy Baker, DO;  Location: [facility];  Service: Gynecology;  Laterality: N/A;   LEFT HEART CATH AND CORONARY ANGIOGRAPHY N/A 07/18/2021   Procedure: LEFT HEART CATH AND CORONARY ANGIOGRAPHY;  Surgeon: Corky Crafts, MD;  Location: [facility] INVASIVE CV LAB;  Service: Cardiovascular;  Laterality: N/A;   LUMBAR FUSION  2001   TOTAL HIP ARTHROPLASTY Right 01/17/2021   Procedure: TOTAL HIP ARTHROPLASTY ANTERIOR APPROACH;  Surgeon: Samson Frederic, MD;  Location: [facility];  Service: Orthopedics;  Laterality: Right;   UPPER GI ENDOSCOPY  yrs ago    Allergies  Allergen Reactions   Repatha [Evolocumab] Itching   Crestor [Rosuvastatin]     MYALGIA    Pravastatin    Statins Other (See Comments)    Muscle pain; Tolerates Crestor     Outpatient Encounter Medications as of 09/28/2023  Medication Sig   acetaminophen (TYLENOL) 500 MG tablet Take 1,000 mg by mouth every 6 (six) hours as needed for mild pain or moderate pain.   amoxicillin (AMOXIL) 500 MG tablet Take 4 tablets by mouth 1-2 hrs before dental work   aspirin EC 81 MG tablet Take 81 mg by mouth daily. Swallow whole.   carvedilol (COREG) 6.25 MG tablet Take 1 tablet (6.25 mg total) by mouth 2 (two) times daily with a meal.   cholecalciferol (VITAMIN D) 25 MCG (1000 UNIT) tablet Take 2,000 Units by mouth daily.   clonazePAM (KLONOPIN) 0.5 MG tablet Take 1 tablet (0.5 mg total) by mouth at bedtime.   Coenzyme Q10 (COQ-10) 100 MG CAPS Take 100 mg by mouth daily.   diclofenac Sodium (VOLTAREN) 1 % GEL Apply 2 g topically 4 (four) times daily.   DULoxetine (CYMBALTA) 30 MG capsule TAKE 1 CAPSULE(30 MG) BY MOUTH DAILY   ezetimibe (ZETIA) 10 MG tablet Take 1 tablet (10 mg total)  by mouth daily.   famotidine (PEPCID) 20 MG tablet Take 20 mg by mouth 2 (two) times daily as needed for heartburn or indigestion.   fexofenadine (ALLEGRA) 180 MG tablet Take 180 mg by mouth at bedtime.    Flaxseed, Linseed, (FLAXSEED OIL PO) Take 5-10 mLs by mouth 3 (three) times a week. Power   fluticasone (FLONASE) 50 MCG/ACT nasal spray Place 1 spray into both nostrils daily as needed for allergies or rhinitis.   hydrALAZINE (APRESOLINE) 25 MG tablet Take 1 tablet (25 mg total) by mouth in the morning and at bedtime.   Lidocaine 4 % PTCH Place 1 patch onto the skin daily as needed (mild pain). Remove & Discard patch within 12 hours or as directed by MD   losartan (COZAAR) 100 MG tablet TAKE 1 TABLET(100 MG) BY MOUTH DAILY   melatonin 5 MG TABS Take 5 mg by mouth at bedtime as needed (Sleep).   modafinil (PROVIGIL) 200 MG tablet One po qAM and one po qNoon   nitroGLYCERIN (NITROSTAT) 0.4 MG SL tablet Place 1 tablet (0.4 mg total) under the tongue every 5 (five) minutes as  needed for chest pain.   Omega-3 Fatty Acids (OMEGA 3 500 PO) Take 500 mg by mouth daily.   pantoprazole (PROTONIX) 40 MG tablet TAKE 1 TABLET(40 MG) BY MOUTH TWICE DAILY   Vibegron (GEMTESA) 75 MG TABS Take 1 tablet (75 mg total) by mouth daily.   [DISCONTINUED] oxybutynin (DITROPAN) 5 MG tablet Take 1 tablet (5 mg total) by mouth 2 (two) times daily. (Patient not taking: Reported on 09/28/2023)   No facility-administered encounter medications on file as of 09/28/2023.    Review of Systems  Constitutional:  Negative for appetite change, chills, fatigue, fever and unexpected weight change.  Respiratory:  Negative for cough, chest tightness, shortness of breath and wheezing.   Cardiovascular:  Negative for chest pain, palpitations and leg swelling.  Gastrointestinal:  Negative for abdominal distention, abdominal pain, nausea and vomiting.  Genitourinary:  Positive for frequency. Negative for difficulty urinating, dysuria,  flank pain and urgency.  Musculoskeletal:  Positive for arthralgias, back pain and gait problem. Negative for joint swelling.  Skin:  Negative for color change, pallor, rash and wound.  Neurological:  Negative for dizziness, speech difficulty, weakness, light-headedness and headaches.  Psychiatric/Behavioral:  Negative for agitation, behavioral problems, confusion, hallucinations and sleep disturbance. The patient is not nervous/anxious.     Immunization History  Administered Date(s) Administered   Fluad Quad(high Dose 65+) 04/01/2019, 04/19/2021   Influenza Whole 05/14/2011   Influenza, High Dose Seasonal PF 04/30/2017, 05/15/2018, 05/18/2020   Influenza-Unspecified 03/04/2022   PFIZER(Purple Top)SARS-COV-2 Vaccination 08/27/2019, 09/21/2019, 03/07/2020, 11/16/2020   Pfizer Covid-19 Vaccine Bivalent Booster 9yrs & up 03/04/2022   Pfizer(Comirnaty)Fall Seasonal Vaccine 12 years and older 04/04/2023   Pneumococcal Conjugate-13 08/30/2018   Pneumococcal Polysaccharide-23 03/21/2009, 04/20/2020   Td (Adult), 2 Lf Tetanus Toxid, Preservative Free 12/04/2009   Zoster Recombinant(Shingrix) 04/04/2023   Zoster, Live 11/29/2009   Pertinent  Health Maintenance Due  Topic Date Due   DEXA SCAN  Never done   INFLUENZA VACCINE  10/19/2023 (Originally 02/19/2023)      12/26/2022   10:07 AM 03/26/2023    1:53 PM 05/22/2023   10:04 AM 07/03/2023    1:17 PM 09/28/2023   10:25 AM  Fall Risk  Falls in the past year? 1 0 1 1 0  Was there an injury with Fall? 0  0 1 0  Fall Risk Category Calculator 1  2 2  0  Patient at Risk for Falls Due to History of fall(s)  History of fall(s) No Fall Risks No Fall Risks  Fall risk Follow up Falls evaluation completed   Falls evaluation completed Falls evaluation completed;Education provided;Falls prevention discussed   Functional Status Survey:    Vitals:   09/28/23 1027  BP: 122/70  Pulse: 65  Resp: 16  Temp: 97.8 F (36.6 C)  SpO2: 95%  Weight: 187 lb  (84.8 kg)  Height: 5\' 2"  (1.575 m)   Body mass index is 34.2 kg/m. Physical Exam  GENERAL: Alert, cooperative, well developed, no acute distress. HEENT: Normocephalic, normal oropharynx, moist mucous membranes. NECK: Mild tenderness in lower back. CHEST: Clear to auscultation bilaterally, no wheezes, rhonchi, or crackles. CARDIOVASCULAR: Normal heart rate and rhythm, S1 and S2 normal without murmurs. ABDOMEN: Soft, non-tender, non-distended, without organomegaly, normal bowel sounds. Bladder slightly tender. EXTREMITIES: No cyanosis or edema. NEUROLOGICAL: Cranial nerves grossly intact, moves all extremities without gross motor or sensory deficit.   Labs reviewed: Recent Labs    10/22/22 1018  NA 139  K 4.6  CL 105  CO2  25  GLUCOSE 93  BUN 26*  CREATININE 0.69  CALCIUM 9.2   Recent Labs    10/22/22 1018 04/15/23 1029 08/19/23 1002  AST 15 15 19   ALT 17 14 16   ALKPHOS  --  115 129*  BILITOT 0.3 0.3 0.4  PROT 5.9* 6.1 6.0  ALBUMIN  --  4.3 3.9   Recent Labs    10/22/22 1018 05/22/23 1059  WBC 7.6 5.5  NEUTROABS 6,209 3,982  HGB 13.5 13.9  HCT 40.7 42.5  MCV 89.3 93.0  PLT 214 248   Lab Results  Component Value Date   TSH 2.85 05/22/2023   Lab Results  Component Value Date   HGBA1C 5.4 06/15/2021   Lab Results  Component Value Date   CHOL 181 08/19/2023   HDL 49 08/19/2023   LDLCALC 110 (H) 08/19/2023   LDLDIRECT 130.2 11/03/2011   TRIG 123 08/19/2023   CHOLHDL 3.7 08/19/2023    Significant Diagnostic Results in last 30 days:  No results found.  Assessment/Plan  Overactive Bladder The patient has overactive bladder managed with various medications, including oxybutynin, Myrbetriq, VESIcare, and Gemtesa. Currently, she is using Gemtesa, which improves sleep but is associated with recurrent UTIs, likely due to incomplete bladder emptying. She is considering returning to VESIcare, which had fewer side effects but less effective control. Discussed  risks of Gemtesa, including recurrent UTIs, and potential benefits of VESIcare, contingent on sample availability and insurance coverage. - Refer to urologist - Check for availability of Gemtesa samples - Provide urine specimen for culture - Continue AZO for UTI symptom relief - Consider resuming VESIcare if Gemtesa samples are unavailable. 14 days tablet Gemtesa 75 mg tablet given until she is seen by urogynocology as requested.   Recurrent Urinary Tract Infections (UTIs) Frequent UTIs possibly related to incomplete bladder emptying due to overactive bladder and MS. Currently using Azelex (phenazopyridine) for symptom relief and drinking pure cranberry juice for prevention. - Provide urine specimen for culture - Continue Azelex (phenazopyridine) for symptom relief - Encourage continued use of pure cranberry juice for UTI prevention  Multiple Sclerosis (MS) Long-standing MS since 2005, contributing to overactive bladder symptoms. Managed by neurologist. - Continue current management with neurologist  General Health Maintenance Proactive in managing health, including using over-the-counter medications and lifestyle modifications to prevent UTIs. - Encourage continued use of pure cranberry juice for UTI prevention  Follow-up - Follow up with urologist after referral - Monitor for UTI symptoms and follow up with primary care if symptoms persist.   Family/ staff Communication: Reviewed plan of care with patient verbalized understanding   Labs/tests ordered:  - POC Urinalysis Dipstick  - Urine Culture  Next Appointment: Return if symptoms worsen or fail to improve.   Total time: minutes. Greater than 50% of total time spent doing patient education regarding OAB,urine frequency and health maintenance including symptom/medication management.   Caesar Bookman, NP

## 2023-09-30 ENCOUNTER — Telehealth: Payer: Self-pay | Admitting: Family

## 2023-09-30 LAB — URINE CULTURE
MICRO NUMBER:: 16181122
SPECIMEN QUALITY:: ADEQUATE

## 2023-09-30 NOTE — Telephone Encounter (Signed)
 Copied from CRM [id number]. Topic: Clinical - Medication Refill >> Sep 30, 2023  4:12 PM Irine Seal wrote: Most Recent Primary Care Visit:  Provider: Richarda Blade C  Department: [facility]  Visit Type: OFFICE VISIT  Date: 09/28/2023  Medication: ciprofloxacin (CIPRO) 500 MG   Has the patient contacted their pharmacy? Yes (Agent: If no, request that the patient contact the pharmacy for the refill. If patient does not wish to contact the pharmacy document the reason why and proceed with request.) (Agent: If yes, when and what did the pharmacy advise?)  Is this the correct pharmacy for this prescription? Yes If no, delete pharmacy and type the correct one.  This is the patient's preferred pharmacy:  [facility][id number] Gin[place] Otto, [place] - [address] AT [facility] OF [facility] [address] [place] [address] Phone: [phone] Fax: [phone]     Has the prescription been filled recently? No  Is the patient out of the medication? Yes  Has the patient been seen for an appointment in the last year OR does the patient have an upcoming appointment? Yes  Can we respond through MyChart? Yes  Agent: Please be advised that Rx refills may take up to 3 business days. We ask that you follow-up with your pharmacy.

## 2023-09-30 NOTE — Telephone Encounter (Signed)
 Urine culture resulted today results was to be called by CMA with recommendation to start Cipro and Florastor if patient agrees then send medication to pharmacy.

## 2023-10-01 ENCOUNTER — Other Ambulatory Visit: Payer: Self-pay | Admitting: Family

## 2023-10-01 MED ORDER — SACCHAROMYCES BOULARDII 250 MG PO CAPS
250.0000 mg | ORAL_CAPSULE | Freq: Two times a day (BID) | ORAL | 0 refills | Status: AC
Start: 2023-10-01 — End: 2023-10-11

## 2023-10-01 MED ORDER — CIPROFLOXACIN HCL 500 MG PO TABS: 500.0000 mg | ORAL_TABLET | Freq: Two times a day (BID) | ORAL | 0 refills | Status: AC

## 2023-10-01 NOTE — Telephone Encounter (Signed)
Cipro prescription send to pharmacy.

## 2023-10-01 NOTE — Telephone Encounter (Signed)
 Awaiting PCP response for Cipro order

## 2023-10-01 NOTE — Telephone Encounter (Signed)
Message routed to PCP [name], Dinah C, NP  

## 2023-10-01 NOTE — Telephone Encounter (Signed)
 High risk or very high risk warning populated when attempting to refill medication. RX request sent to PCP for review and approval if warranted.

## 2023-10-01 NOTE — Telephone Encounter (Unsigned)
 Copied from CRM [id number]. Topic: Clinical - Medication Refill >> Oct 01, 2023  3:15 PM Dondra Prader A wrote: Most Recent Primary Care Visit:  Provider: Richarda Blade C  Department: [facility]  Visit Type: OFFICE VISIT  Date: 09/28/2023  Medication: ciprofloxacin (CIPRO) 500 MG tablet Patient states on Monday her PCP was supposed to send in this medication and another one to counter act this medication for her stomach. She cannot remember the name of the other medication.   Has the patient contacted their pharmacy? Yes Pharmacy does not have the medication, states it was not called in.   Is this the correct pharmacy for this prescription? Yes If no, delete pharmacy and type the correct one.  This is the patient's preferred pharmacy:  [facility] #[id number] Ginette Otto, [place] - [address] AT [facility] OF [facility] & [address] [place] [address] Phone: [phone] Fax: [phone]   Has the prescription been filled recently? No  Is the patient out of the medication? Yes  Has the patient been seen for an appointment in the last year OR does the patient have an upcoming appointment? Yes  Can we respond through MyChart? No  Agent: Please be advised that Rx refills may take up to 3 business days. We ask that you follow-up with your pharmacy.

## 2023-10-02 ENCOUNTER — Telehealth: Payer: Self-pay | Admitting: *Deleted

## 2023-10-02 ENCOUNTER — Ambulatory Visit (HOSPITAL_BASED_OUTPATIENT_CLINIC_OR_DEPARTMENT_OTHER): Payer: Medicare Other | Attending: Physical Medicine and Rehabilitation | Admitting: Physical Therapy

## 2023-10-02 ENCOUNTER — Encounter (HOSPITAL_BASED_OUTPATIENT_CLINIC_OR_DEPARTMENT_OTHER): Payer: Self-pay | Admitting: Physical Therapy

## 2023-10-02 DIAGNOSIS — M6281 Muscle weakness (generalized): Secondary | ICD-10-CM | POA: Insufficient documentation

## 2023-10-02 DIAGNOSIS — M5459 Other low back pain: Secondary | ICD-10-CM | POA: Insufficient documentation

## 2023-10-02 DIAGNOSIS — R252 Cramp and spasm: Secondary | ICD-10-CM | POA: Diagnosis not present

## 2023-10-02 DIAGNOSIS — R262 Difficulty in walking, not elsewhere classified: Secondary | ICD-10-CM | POA: Diagnosis not present

## 2023-10-02 NOTE — Telephone Encounter (Signed)
 Copied from CRM [id number]. Topic: Referral - Request for Referral >> Oct 02, 2023  1:27 PM Alfred Levins wrote: Pt Dawn [name] is calling and states that  [facility] does not have any available appointments until June. She would like to know is the any other facility that could possibly have a sooner appointment date. Please advise.

## 2023-10-02 NOTE — Therapy (Signed)
 OUTPATIENT PHYSICAL THERAPY THORACOLUMBAR Treatment/Progress note    Patient Name: SRI CLEGG MRN: [id number] DOB:Mar 29, 1946, 78 y.o., female Today's Date: 10/02/2023  END OF SESSION:  PT End of Session - 10/02/23 1032     Visit Number 49    Number of Visits 59    Date for PT Re-Evaluation 12/11/23    Authorization Type progress note at 49    PT Start Time 1015    PT Stop Time 1057    PT Time Calculation (min) 42 min    Activity Tolerance Patient tolerated treatment well    Behavior During Therapy [facility] for tasks assessed/performed                      Past Medical History:  Diagnosis Date   Allergy    CAD (coronary artery disease)    s/p DES to RCA in 06/2021   Dry eyes    Endometrial polyp    Essential hypertension 08/25/2018   GERD (gastroesophageal reflux disease)    History of hiatal hernia    History of kidney stones    Hot flashes, menopausal 10/13/2011   Estradiol started    Hyperlipidemia    Jaundice as teenager   no problems since   Memory loss 09/21/2019     1/2 of feet numb all the time   Multiple sclerosis (HCC) dx 2001   Neuropathy    bilateral feet   Osteoarthritis    Sjogren's syndrome (HCC) dx oct 2021   sore muscvles, dry mouth and eyes   Stroke (HCC)    Vertigo    Vision abnormalities    Past Surgical History:  Procedure Laterality Date   ABDOMINAL HYSTERECTOMY  2002   partial   COLONOSCOPY  01/27/2022   2 day prep   colonscopy  2011   CORONARY STENT INTERVENTION N/A 07/18/2021   Procedure: CORONARY STENT INTERVENTION;  Surgeon: Corky Crafts, MD;  Location: [facility];  Service: Cardiovascular;  Laterality: N/A;   DILATATION & CURETTAGE/HYSTEROSCOPY WITH MYOSURE N/A 06/08/2020   Procedure: DILATATION & CURETTAGE/HYSTEROSCOPY/Polypectomy WITH MYOSURE;  Surgeon: Toy Baker, DO;  Location: [facility];  Service: Gynecology;  Laterality: N/A;   LEFT HEART CATH AND CORONARY ANGIOGRAPHY N/A  07/18/2021   Procedure: LEFT HEART CATH AND CORONARY ANGIOGRAPHY;  Surgeon: Corky Crafts, MD;  Location: [facility]IVE CV [facility];  Service: Cardiovascular;  Laterality: N/A;   LUMBAR FUSION  2001   TOTAL HIP ARTHROPLASTY Right 01/17/2021   Procedure: TOTAL HIP ARTHROPLASTY ANTERIOR APPROACH;  Surgeon: Samson Frederic, MD;  Location: [facility];  Service: Orthopedics;  Laterality: Right;   UPPER GI ENDOSCOPY  yrs ago   Patient Active Problem List   Diagnosis Date Noted   UTI (urinary tract infection) 07/09/2023   OAB (overactive bladder) 07/03/2023   Statin myopathy 06/26/2023   History of CVA (cerebrovascular accident) 06/26/2023   Rheumatoid arthritis involving right hand, unspecified whether rheumatoid factor present (HCC) 05/22/2023   Atherosclerotic heart disease of native coronary artery with other forms of angina pectoris (HCC) 05/22/2023   Positive colorectal cancer screening using Cologuard test 11/14/2021   Coronary artery disease    Statin intolerance 04/19/2021   Osteoarthritis of right hip 01/17/2021   Status post THR (total hip replacement) 01/17/2021   Chronic bilateral low back pain without sciatica 10/02/2020   Chronic pain syndrome 06/20/2020   Post laminectomy syndrome 06/20/2020   Sjogren's disease (HCC) 05/23/2020   Primary osteoarthritis of both  hands 05/23/2020   Primary osteoarthritis of both feet 05/23/2020   Sjogren's syndrome (HCC) 05/23/2020   Other secondary scoliosis, lumbar region 12/22/2019   Spondylosis without myelopathy or radiculopathy, lumbar region 12/22/2019   Cervical radiculopathy 10/18/2019   Numbness 09/21/2019   Bilateral carpal tunnel syndrome 09/21/2019   High risk medication use 09/21/2019   Memory loss 09/21/2019   Essential hypertension 08/25/2018   Abnormal SPEP 02/19/2018   Hand pain 02/20/2017   Multiple joint pain 02/18/2017   Disturbed cognition 06/25/2016   Impaired cognition 06/25/2016   Sciatica, right side 10/30/2015    Chronic fatigue 08/04/2014   Gait disturbance 08/04/2014   Unspecified visual disturbance 01/31/2013   Transient vision disturbance 01/31/2013   Colon polyps 11/03/2011   POSTHERPETIC NEURALGIA 10/03/2009   DISPLCMT LUMBAR INTERVERT DISC W/O MYELOPATHY 09/05/2009   RENAL CALCULUS, RECURRENT 09/04/2009   Hyperlipidemia 08/31/2009   Multiple sclerosis (HCC) 08/31/2009   ALLERGIC RHINITIS 08/31/2009  Progress Note Reporting Period 07/23/2023 to 10/02/2023  See note below for Objective Data and Assessment of Progress/Goals.     PCP: Iantha Fallen NP  REFERRING PROVIDER: Dr Romero Belling   REFERRING DIAG: Low Back Pain   Rationale for Evaluation and Treatment: Rehabilitation  THERAPY DIAG:  Difficulty in walking, not elsewhere classified  Other low back pain  Cramp and spasm  Muscle weakness (generalized)  ONSET DATE: About 11 days ago her nerve abiliation wore off.   SUBJECTIVE:                                                                                                                                                                                           SUBJECTIVE STATEMENT: The patient had her ablation. She has been sore since. She Iis suing her walker for stability.   PERTINENT HISTORY:  Right Hip replacement 2022, Low back Pain, MS, TIA October 2023; MI, November 2022; CAD, Sjorgens syndrome  PAIN:  Are you having pain? Yes: NPRS scale: 6/10 Pain location left side lumbar spine Pain description: aching  Aggravating factors: standing and walking  Relieving factors: rest    PRECAUTIONS: None  WEIGHT BEARING RESTRICTIONS: No  FALLS:  Has patient fallen in last 6 months? 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   LUMBAR ROM:   AROM eval 5/15 10/3  Flexion Limited 50 % with pain  Mild improvement but still about 50%  Improved pain but still limited  Extension No limit     Right lateral flexion     Left lateral flexion     Right rotation Limited 50% with pain     Left rotation Limited 50% with pain      (Blank rows = not tested)  LOWER EXTREMITY ROM:     Passive  Right eval Left eval Right  Right  7/24 Right 1/2  Hip flexion 105 90 105 110 with pain at end range  100 with pain   Hip extension   10 degrees from neutral before manual today. Too neutral after stretching     Hip abduction       Hip adduction       Hip internal rotation painful Painful  Painful  Full with less pain  Painful   Hip external rotation   20 degrees with pain  45 with pain  48 with pain   Knee flexion       Knee extension       Ankle dorsiflexion       Ankle plantarflexion       Ankle inversion       Ankle eversion        (Blank rows = not tested)  LOWER EXTREMITY MMT:    MMT Right eval Left eval Right 7/24 Left 7/24 Right  10/2 Left 10/2 right 1/2 left 1/2 Right  Left   Hip flexion 13.1 12.3 11.3 19.1 15.3 23.4 16.7 23.4 17.5 20.4  Hip extension            Hip abduction 11.5 10.1 12.6 17.8 23.8 28.5 25.6 27.9 26.6 20.9  Hip adduction            Hip internal rotation            Hip external rotation            Knee flexion            Knee extension 14.0 13.3 19.4 18.3 23.8 23.6 22.6 24.8 28.8 20.7  Ankle dorsiflexion            Ankle plantarflexion            Ankle  inversion            Ankle eversion             (Blank rows = not tested)  FUNCTIONAL TESTS:  5 times sit to stand: 28 sec  5/15 5 times sit to stand test 20 seconds 7/25 24 sec  10/16 17 sec 1/2 17 seconds 3/14 18 sec   3-minute walk test 150 feet without requirement of seated rest break 5/15  7/25 3 min walk test 200'   10/16 3 min walk test 379'   3/14   GAIT:  TODAY'S TREATMENT:  DATE:  3/14 Nu-step 5 min L5  Ball roll out fwd x10  Ball roll out lateral x10 each direction LTR 2x15    Strength testing  Neuro-re-ed   Ball press x20  Hip adduction with a bal l   Gait: walk test      2/26  There-ex: Nu-step 5 min L5  Ball roll out fwd x10  Ball roll out lateral x10 each direction  [employer] press x20   Neuro-re-ed  Row 3x10 red  Shoulder extnesion 3x10 red   Reviewed set up and rest   Step onto and off air-ex x20  Narrow base 3x30 sec hold  Heel/toe x20         2/12  There-ex   Nu-step 5 min L5  Ball roll out fwd x10  Ball roll out lateral x10 each direction  [employer] press x20    Neruo re-ed :   cuing for posture with all  Bilateral er red 3x15  Horizontal abduction 3x15 red  Bicpes curl 3lbs 3x15 red   Supine march with TA 3x10  Supine ball squeeze with TA 3x10 Supine hip abduction with TA 3x10   2/6 Subsequent Treatment: Instructions provided previously at initial dry needling treatment.  Instructions reviewed, if requested by the patient, prior to subsequent dry needling treatment.   Patient Verbal Consent Given: Yes Education Handout Provided: Previously Provided Muscles Treated: right gluteal x3 using a .30x650 needle. Right lumbar paraspinals at L1-L3 with care not to go into area of previous laminectomy. + Electrical Stimulation Performed: No Treatment Response/Outcome: great twitch   Manual:  trigger point release to gluteal and lower lumbar spine with focus on the left side.. Trigger point release to hips and spine; Roller to anterior hip.   There-ex:  LTR x20  Hip abduction 2x15 red   Neuro-re-ed Row with abdominal breathing 3 x 10 red Extension 3 x 10 red with abdominal breathing     1/29 Trigger Point Dry Needling  Subsequent Treatment: Instructions provided previously at initial dry needling treatment.  Instructions reviewed, if requested by the patient, prior to subsequent dry needling treatment.   Patient Verbal Consent Given: Yes Education Handout Provided: Previously Provided Muscles Treated: right gluteal x3 using a .30x650 needle. Right lumbar paraspinals at L1-L3 with care not to go into area of previous laminectomy. + Electrical Stimulation Performed: No Treatment Response/Outcome: great twitch     Manual: trigger point release to gluteal and lower lumbar spine with focus on the left side.. Trigger point release to hips and spine; Roller to anterior hip.     LTR 2x20  SAQ 3 x 15 each leg Supine hip abduction 3 x 15 red band 1/15  Manual: trigger point release to gluteal and lower lumbar spine with focus on the left side.. Trigger point release to hips and spine; Roller to anterior hip.   LTR 2x20  SAQ 3 x 15 each leg Supine hip abduction 3 x 15 red band Trigger Point Dry Needling  Subsequent Treatment: Instructions provided previously at initial dry needling treatment.  Instructions reviewed, if requested by the patient, prior to subsequent dry needling treatment.   Patient Verbal Consent Given: Yes Education Handout Provided: Previously Provided Muscles Treated: right gluteal x3 using a .30x650 needle.  Electrical Stimulation Performed: No Treatment Response/Outcome: great twitch     1/2 Manual: trigger point release to gluteal and lower lumbar spine with focus on the left side.. Trigger point release to hips and spine; Roller to  anterior hip.  LTR 2x20  SAQ 3 x 15 each leg Supine hip abduction 3 x 15 red band  LAQ 2 x 15  Standing: Squat to heel raise 3 x 10  Reviewed strength and functional measurements        PATIENT EDUCATION:  Education details: reviewed HEP, symptom management  Person educated: Patient Education method: Explanation, Demonstration, Tactile cues, Verbal cues, and Handouts Education comprehension: verbalized understanding, returned demonstration, verbal cues required, tactile cues required, and needs further education  HOME EXERCISE PROGRAM: Has previous HEP. Will review next visit  ASSESSMENT:  CLINICAL IMPRESSION:  The patients objective numbers have remained fairly constant She had  recently had a reduction in pain which has allowed [place] to advance her exercises some. She had a procedure last week and has residual soreness. Her walk test distance is about the same as her last test. She would benefit from further skilled therapy to advance her ability to perform functional tasks. She requires skilled intervention to navigate fluctuating pain levels and any complications with progressive MS and multiple MSK issues.      The patient feels like since she has advanced to a cane her balance has been off. We worked on balance today. She required in a with narrow base on air-ex. She requires cuing to sow down and rest between her sets. We reviewed how to put the band in the door at home. While she is doing well, we will advance her strengthening and balance as much as possible. She was advised if she gets pain in her hips or back to work on her stretches and soft tissue mobilization. See below for goal specific progress   OBJECTIVE IMPAIRMENTS: Abnormal gait, decreased activity tolerance, decreased balance, decreased endurance, decreased knowledge of use of DME, decreased mobility, difficulty walking, decreased ROM, decreased strength, increased muscle spasms, and pain.   ACTIVITY  LIMITATIONS: carrying, lifting, bending, standing, squatting, sleeping, stairs, transfers, and locomotion level  PARTICIPATION LIMITATIONS: meal prep, cleaning, laundry, driving, shopping, community activity, and yard work  PERSONAL FACTORS: Right Hip replacement 2022, Low back Pain, MS, TIA October 2023; MI, November 2022; CAD, Sjorgens syndrome are also affecting patient's functional outcome. MS  REHAB POTENTIAL: Good  CLINICAL DECISION MAKING: Evolving/moderate complexity declining mobility   EVALUATION COMPLEXITY: Moderate   GOALS: Goals reviewed with patient? No  SHORT TERM GOALS: Target date: 09/29/2022    Patient will increase gross bilateral lower extremity strength by 5 pounds Baseline: Goal status: has returned to baseline from a few progress notes ago 10/16  2.  Patient will increase lumbar flexion by 10 degrees Baseline:  Goal status:  mild improvement but still painful 10/16  3.  Patient will be independent with basic after exercise program and stretching program Baseline:  Goal status: has basic HEP achieved 10/16   LONG TERM GOALS: Target date: 09/29/2022     Patient will stand for greater than 30 minutes without increased pain Baseline:  Goal status: improving 5/15  continued improvement 10/16   2.  Patient will pick item up off the floor without increased low back pain Baseline:  Goal status:still having pain 5/15 improved but still painful 10/16   3.  Patient will ambulate community distances without report of increased low back pain Baseline:  Goal status:limited by pain but maintaining 7/25  4.  Patient will report improved ability to sleep through the night secondary to back pain Baseline:  Goal status: continues to have pain at night 7/25    PLAN:  PT FREQUENCY:  1x/week  PT DURATION: 10 weeks   PLANNED INTERVENTIONS: Therapeutic exercises, Therapeutic activity, Neuromuscular re-education, Balance training, Gait training, Patient/Family  education, Self Care, Joint mobilization, Stair training, DME instructions, Aquatic Therapy, Dry Needling, Spinal mobilization, Cryotherapy, Moist heat, Ionotophoresis 4mg /ml Dexamethasone, and Manual therapy.  PLAN FOR NEXT SESSION: Consider manual therapy to the back and hips, consider trigger point dry needling to back and hips.  Reviewed basic HEP.  Begin active strengthening of hips and core.  Begin gait training and functional mobility training.       Dessie Coma, PT 10/28/2022, 3:29 PM

## 2023-10-02 NOTE — Telephone Encounter (Signed)
 Please check with referral Co-ordinate if there's any other provider with a sooner appointment then forward referral.

## 2023-10-03 ENCOUNTER — Other Ambulatory Visit: Payer: Self-pay | Admitting: Family

## 2023-10-07 ENCOUNTER — Telehealth: Payer: Self-pay

## 2023-10-07 NOTE — Telephone Encounter (Signed)
 Copied from CRM [id number]. Topic: General - Other >> Oct 07, 2023 11:34 AM Antony Haste wrote: Reason for CRM: Janae with [employer] will be faxing over another copy of a  medical necessity on behalf of this patient. She states she previously faxed this yesterday on 03/18, I confirmed that it has not been received yet. Janae re-confirmed the office's fax number to ensure it's being sent properly. She will follow-up on this later this week to determine if it's been received.  Callback [phone]  Incoming fax received from [employer] with a blank template requesting a letter of medical necessity. I am unable to determine what medication this is for as the cover sheet does not clarify. I left a message for the patient requesting she return call with the name of a specific medication that this if for. Form is being held at the clinical intake desk

## 2023-10-08 ENCOUNTER — Telehealth: Payer: Self-pay

## 2023-10-08 DIAGNOSIS — N3281 Overactive bladder: Secondary | ICD-10-CM

## 2023-10-08 MED ORDER — SOLIFENACIN SUCCINATE 5 MG PO TABS: 5.0000 mg | ORAL_TABLET | Freq: Every day | ORAL | 1 refills | Status: DC

## 2023-10-08 NOTE — Telephone Encounter (Signed)
 I called and left a message on the 18th and again this morning to speak with Ms. Kintzel regarding other options for urology.

## 2023-10-08 NOTE — Telephone Encounter (Signed)
 Okay.will wait for the update next week.

## 2023-10-08 NOTE — Telephone Encounter (Signed)
-----   Message from [place] B sent at 10/08/2023 10:31 AM EDT ----- Regarding: RX change I spoke with patient regarding the referral to Urogynecology. Pt needs to stay in [place] so that limits sooner availability, but I am checking and will call her back.   1)She asked me to send you a message to ask if she can go back to taking Vesacare for overactive bladder- she used to take it and she does not recall side effects.    2)She said she has 4 day supply of Gemtesa and does not want to continue taking it.   Please follow up with her request for new RX Vesacare.   Thanks, Albin Felling  Per Lauralee Evener D  response.  Please Advise  Message sent to [name], [name], NP

## 2023-10-08 NOTE — Telephone Encounter (Deleted)
 Jene with [facility] correct call back number is [phone]

## 2023-10-08 NOTE — Telephone Encounter (Signed)
-----   Message from [place] B sent at 10/08/2023 10:31 AM EDT ----- Regarding: RX change I spoke with patient regarding the referral to Urogynecology. Pt needs to stay in [place] so that limits sooner availability, but I am checking and will call her back.  1)She asked me to send you a message to ask if she can go back to taking Vesacare for overactive bladder- she used to take it and she does not recall side effects.   2)She said she has 4 day supply of Gemtesa and does not want to continue taking it.  Please follow up with her request for new RX Vesacare.  Thanks, [employer]

## 2023-10-08 NOTE — Telephone Encounter (Signed)
 Vesicare prescription send to pharmacy.will need to discontinue if [name] is approved or whichever she prefers.

## 2023-10-08 NOTE — Telephone Encounter (Signed)
 Spoke with Medigene from [facility] stated that Dawn [name] has spoken with [name] about the documentation for [name] and they are correct and we give an update for the medication next week.  Message sent to [name], [name], NP

## 2023-10-08 NOTE — Telephone Encounter (Addendum)
 AttnMaurice Small, CMA    Jene with [facility] calling back Informed her that you received the documentation and that the sheet had No medication name on it and that you were waiting for a call back from the patient Virgilio Frees said they can not put the rx name on the document but she can say it verbally the rx is: Collier Flowers with [facility] correct call back number is [phone]

## 2023-10-09 ENCOUNTER — Ambulatory Visit (HOSPITAL_BASED_OUTPATIENT_CLINIC_OR_DEPARTMENT_OTHER): Payer: Medicare Other | Admitting: Physical Therapy

## 2023-10-09 NOTE — Telephone Encounter (Signed)
 Patient is aware that the medication has been sent to the pharmacy and wanted to thank the staff and [name], Dinah C, NP for begin so kind and all the care that we do in the office.  Message sent to [name], [name], NP

## 2023-10-09 NOTE — Telephone Encounter (Signed)
 Thank You.

## 2023-10-14 ENCOUNTER — Ambulatory Visit (HOSPITAL_BASED_OUTPATIENT_CLINIC_OR_DEPARTMENT_OTHER): Payer: Medicare Other | Admitting: Physical Therapy

## 2023-10-14 ENCOUNTER — Encounter (HOSPITAL_BASED_OUTPATIENT_CLINIC_OR_DEPARTMENT_OTHER): Payer: Self-pay | Admitting: Physical Therapy

## 2023-10-14 DIAGNOSIS — M6281 Muscle weakness (generalized): Secondary | ICD-10-CM

## 2023-10-14 DIAGNOSIS — G8929 Other chronic pain: Secondary | ICD-10-CM

## 2023-10-14 DIAGNOSIS — R252 Cramp and spasm: Secondary | ICD-10-CM

## 2023-10-14 DIAGNOSIS — R262 Difficulty in walking, not elsewhere classified: Secondary | ICD-10-CM

## 2023-10-14 DIAGNOSIS — M5459 Other low back pain: Secondary | ICD-10-CM | POA: Diagnosis not present

## 2023-10-14 DIAGNOSIS — M542 Cervicalgia: Secondary | ICD-10-CM

## 2023-10-14 NOTE — Therapy (Signed)
 OUTPATIENT PHYSICAL THERAPY THORACOLUMBAR Treatment/Progress note    Patient Name: Dawn [name] MRN: [id number] DOB:14-Feb-1946, 78 y.o., female Today's Date: 10/14/2023  END OF SESSION:  PT End of Session - 10/14/23 1150     Visit Number 50    Number of Visits 59    Date for PT Re-Evaluation 12/11/23    PT Start Time 1150    PT Stop Time 1223    PT Time Calculation (min) 33 min    Activity Tolerance No increased pain;Patient limited by fatigue;Patient tolerated treatment well    Behavior During Therapy [facility] for tasks assessed/performed                       Past Medical History:  Diagnosis Date   Allergy    CAD (coronary artery disease)    s/p DES to RCA in 06/2021   Dry eyes    Endometrial polyp    Essential hypertension 08/25/2018   GERD (gastroesophageal reflux disease)    History of hiatal hernia    History of kidney stones    Hot flashes, menopausal 10/13/2011   Estradiol started    Hyperlipidemia    Jaundice as teenager   no problems since   Memory loss 09/21/2019     1/2 of feet numb all the time   Multiple sclerosis (HCC) dx 2001   Neuropathy    bilateral feet   Osteoarthritis    Sjogren's syndrome (HCC) dx oct 2021   sore muscvles, dry mouth and eyes   Stroke (HCC)    Vertigo    Vision abnormalities    Past Surgical History:  Procedure Laterality Date   ABDOMINAL HYSTERECTOMY  2002   partial   COLONOSCOPY  01/27/2022   2 day prep   colonscopy  2011   CORONARY STENT INTERVENTION N/A 07/18/2021   Procedure: CORONARY STENT INTERVENTION;  Surgeon: Corky Crafts, MD;  Location: [facility];  Service: Cardiovascular;  Laterality: N/A;   DILATATION & CURETTAGE/HYSTEROSCOPY WITH MYOSURE N/A 06/08/2020   Procedure: DILATATION & CURETTAGE/HYSTEROSCOPY/Polypectomy WITH MYOSURE;  Surgeon: Toy Baker, DO;  Location: [facility];  Service: Gynecology;  Laterality: N/A;   LEFT HEART CATH AND CORONARY ANGIOGRAPHY  N/A 07/18/2021   Procedure: LEFT HEART CATH AND CORONARY ANGIOGRAPHY;  Surgeon: Corky Crafts, MD;  Location: [facility] CV [facility];  Service: Cardiovascular;  Laterality: N/A;   LUMBAR FUSION  2001   TOTAL HIP ARTHROPLASTY Right 01/17/2021   Procedure: TOTAL HIP ARTHROPLASTY ANTERIOR APPROACH;  Surgeon: Samson Frederic, MD;  Location: [facility];  Service: Orthopedics;  Laterality: Right;   UPPER GI ENDOSCOPY  yrs ago   Patient Active Problem List   Diagnosis Date Noted   UTI (urinary tract infection) 07/09/2023   OAB (overactive bladder) 07/03/2023   Statin myopathy 06/26/2023   History of CVA (cerebrovascular accident) 06/26/2023   Rheumatoid arthritis involving right hand, unspecified whether rheumatoid factor present (HCC) 05/22/2023   Atherosclerotic heart disease of native coronary artery with other forms of angina pectoris (HCC) 05/22/2023   Positive colorectal cancer screening using Cologuard test 11/14/2021   Coronary artery disease    Statin intolerance 04/19/2021   Osteoarthritis of right hip 01/17/2021   Status post THR (total hip replacement) 01/17/2021   Chronic bilateral low back pain without sciatica 10/02/2020   Chronic pain syndrome 06/20/2020   Post laminectomy syndrome 06/20/2020   Sjogren's disease (HCC) 05/23/2020   Primary osteoarthritis of both hands 05/23/2020  Primary osteoarthritis of both feet 05/23/2020   Sjogren's syndrome (HCC) 05/23/2020   Other secondary scoliosis, lumbar region 12/22/2019   Spondylosis without myelopathy or radiculopathy, lumbar region 12/22/2019   Cervical radiculopathy 10/18/2019   Numbness 09/21/2019   Bilateral carpal tunnel syndrome 09/21/2019   High risk medication use 09/21/2019   Memory loss 09/21/2019   Essential hypertension 08/25/2018   Abnormal SPEP 02/19/2018   Hand pain 02/20/2017   Multiple joint pain 02/18/2017   Disturbed cognition 06/25/2016   Impaired cognition 06/25/2016   Sciatica, right side 10/30/2015    Chronic fatigue 08/04/2014   Gait disturbance 08/04/2014   Unspecified visual disturbance 01/31/2013   Transient vision disturbance 01/31/2013   Colon polyps 11/03/2011   POSTHERPETIC NEURALGIA 10/03/2009   DISPLCMT LUMBAR INTERVERT DISC W/O MYELOPATHY 09/05/2009   RENAL CALCULUS, RECURRENT 09/04/2009   Hyperlipidemia 08/31/2009   Multiple sclerosis (HCC) 08/31/2009   ALLERGIC RHINITIS 08/31/2009  Progress Note Reporting Period 07/23/2023 to 10/02/2023  See note below for Objective Data and Assessment of Progress/Goals.     PCP: Iantha Fallen NP  REFERRING PROVIDER: Dr Romero Belling   REFERRING DIAG: Low Back Pain   Rationale for Evaluation and Treatment: Rehabilitation  THERAPY DIAG:  Difficulty in walking, not elsewhere classified  Other low back pain  Cramp and spasm  Muscle weakness (generalized)  Chronic bilateral low back pain without sciatica  Cervicalgia  ONSET DATE: About 11 days ago her nerve abiliation wore off.   SUBJECTIVE:                                                                                                                                                                                           SUBJECTIVE STATEMENT: 3/26 Pt reported falling and catching herself. Feeling pain on left side of leg and left arm. She feels like she may have a sinus infection   PERTINENT HISTORY:  Right Hip replacement 2022, Low back Pain, MS, TIA October 2023; MI, November 2022; CAD, Sjorgens syndrome  PAIN:  Are you having pain? Yes: NPRS scale: 6/10 Pain location left side lumbar spine Pain description: aching  Aggravating factors: standing and walking  Relieving factors: rest    PRECAUTIONS: None  WEIGHT BEARING RESTRICTIONS: No  FALLS:  Has patient fallen in last 6 months? 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   LUMBAR ROM:   AROM eval 5/15 10/3  Flexion Limited 50 % with pain  Mild improvement but still about 50%  Improved pain but still limited  Extension No limit     Right lateral flexion     Left lateral flexion     Right rotation Limited 50% with pain     Left rotation Limited 50% with pain      (Blank rows = not tested)  LOWER EXTREMITY ROM:     Passive  Right eval Left eval Right  Right  7/24 Right 1/2  Hip flexion 105 90 105 110 with pain at end range  100 with pain   Hip extension   10 degrees from neutral before manual today. Too neutral after stretching     Hip abduction       Hip adduction       Hip internal rotation painful Painful  Painful  Full with less pain  Painful   Hip external rotation   20 degrees with pain  45 with pain  48 with pain   Knee flexion       Knee extension       Ankle dorsiflexion       Ankle plantarflexion       Ankle inversion       Ankle eversion        (Blank rows = not tested)  LOWER EXTREMITY MMT:    MMT Right eval Left eval Right 7/24 Left 7/24 Right  10/2 Left 10/2 right 1/2 left 1/2 Right  Left   Hip flexion 13.1 12.3 11.3 19.1 15.3 23.4 16.7 23.4 17.5 20.4  Hip extension            Hip abduction 11.5 10.1 12.6 17.8 23.8 28.5 25.6 27.9 26.6 20.9  Hip adduction            Hip internal rotation            Hip external rotation            Knee flexion            Knee extension 14.0  13.3 19.4 18.3 23.8 23.6 22.6 24.8 28.8 20.7  Ankle dorsiflexion            Ankle plantarflexion            Ankle inversion            Ankle eversion             (Blank rows = not tested)  FUNCTIONAL TESTS:  5 times sit to stand: 28 sec  5/15 5 times sit to stand test 20 seconds 7/25 24 sec  10/16 17 sec 1/2 17 seconds 3/14 18 sec   3-minute walk test 150 feet without requirement of seated rest break 5/15  7/25 3 min walk test 200'   10/16 3 min walk test 379'   3/14   GAIT:  TODAY'S TREATMENT:  DATE:  3/26 Manual: STM to low back paraspinals, lateral hip paraspinals and into the anterior hip on the left  There-ex: Nu-step 5 min L5  Ball roll out fwd x10  Ball roll out lateral x10 each direction LTR 2x15    Nuro-Re-ed  Ball press x20   At this time the patient began to report syncope. Her B?P was measured. She reports baseline vertigo.      3/14 Nu-step 5 min L5  Ball roll out fwd x10  Ball roll out lateral x10 each direction LTR 2x15    Strength testing  Neuro-re-ed   Ball press x20  Hip adduction with a bal l   Gait: walk test      2/26  There-ex: Nu-step 5 min L5  Ball roll out fwd x10  Ball roll out lateral x10 each direction  [employer] press x20   Neuro-re-ed  Row 3x10 red  Shoulder extnesion 3x10 red   Reviewed set up and rest   Step onto and off air-ex x20  Narrow base 3x30 sec hold  Heel/toe x20         2/12  There-ex   Nu-step 5 min L5  Ball roll out fwd x10  Ball roll out lateral x10 each direction  [employer] press x20    Neruo re-ed :   cuing for posture with all  Bilateral er red 3x15  Horizontal abduction 3x15 red  Bicpes curl 3lbs 3x15 red   Supine march with TA 3x10  Supine ball squeeze with TA 3x10 Supine hip abduction with TA 3x10   2/6 Subsequent  Treatment: Instructions provided previously at initial dry needling treatment.  Instructions reviewed, if requested by the patient, prior to subsequent dry needling treatment.   Patient Verbal Consent Given: Yes Education Handout Provided: Previously Provided Muscles Treated: right gluteal x3 using a .30x650 needle. Right lumbar paraspinals at L1-L3 with care not to go into area of previous laminectomy. + Electrical Stimulation Performed: No Treatment Response/Outcome: great twitch   Manual: trigger point release to gluteal and lower lumbar spine with focus on the left side.. Trigger point release to hips and spine; Roller to anterior hip.   There-ex:  LTR x20  Hip abduction 2x15 red   Neuro-re-ed Row with abdominal breathing 3 x 10 red Extension 3 x 10 red with abdominal breathing     1/29 Trigger Point Dry Needling  Subsequent Treatment: Instructions provided previously at initial dry needling treatment.  Instructions reviewed, if requested by the patient, prior to subsequent dry needling treatment.   Patient Verbal Consent Given: Yes Education Handout Provided: Previously Provided Muscles Treated: right gluteal x3 using a .30x650 needle. Right lumbar paraspinals at L1-L3 with care not to go into area of previous laminectomy. + Electrical Stimulation Performed: No Treatment Response/Outcome: great twitch     Manual: trigger point release to gluteal and lower lumbar spine with focus on the left side.. Trigger point release to hips and spine; Roller to anterior hip.     LTR 2x20  SAQ 3 x 15 each leg Supine hip abduction 3 x 15 red band 1/15  Manual: trigger point release to gluteal and lower lumbar spine with focus on the left side.. Trigger point release to hips and spine; Roller to anterior hip.   LTR 2x20  SAQ 3 x 15 each leg Supine hip abduction 3 x 15 red band Trigger Point Dry Needling  Subsequent Treatment: Instructions provided previously at initial dry  needling treatment.  Instructions reviewed, if requested by the patient, prior to subsequent dry needling treatment.   Patient Verbal Consent Given: Yes Education Handout Provided: Previously Provided Muscles Treated: right gluteal x3 using a .30x650 needle.  Electrical Stimulation Performed: No Treatment Response/Outcome: great twitch     1/2 Manual: trigger point release to gluteal and lower lumbar spine with focus on the left side.. Trigger point release to hips and spine; Roller to anterior hip.   LTR 2x20  SAQ 3 x 15 each leg Supine hip abduction 3 x 15 red band  LAQ 2 x 15  Standing: Squat to heel raise 3 x 10  Reviewed strength and functional measurements        PATIENT EDUCATION:  Education details: reviewed HEP, symptom management  Person educated: Patient Education method: Explanation, Demonstration, Tactile cues, Verbal cues, and Handouts Education comprehension: verbalized understanding, returned demonstration, verbal cues required, tactile cues required, and needs further education  HOME EXERCISE PROGRAM: Has previous HEP. Will review next visit  ASSESSMENT:  CLINICAL IMPRESSION: 3/26 Pt came in and warmed up on the nu-step with no increase in symptoms. The patient performed  rollouts for the lower back which she said felt good. Had some pain in the lateral hip which got significantly better with STM. After performing rollouts the patient became syncopal. She reports baseline vertigo. She felt like she was aving a bout with vertigo. Her B/P was measured at 167/97. She was advised this is high. She reported her pain goes up with vertigo. After several minutes her syncope improved slightly. She was advised that she may benefit from visiting the emergency room. She declined reporting she has this from time to time. After 15-20 minutes resting she reported feeling better and left the building. Therapy will contact on 3/28 to speak with the patient about her  symptoms.     OBJECTIVE IMPAIRMENTS: Abnormal gait, decreased activity tolerance, decreased balance, decreased endurance, decreased knowledge of use of DME, decreased mobility, difficulty walking, decreased ROM, decreased strength, increased muscle spasms, and pain.   ACTIVITY LIMITATIONS: carrying, lifting, bending, standing, squatting, sleeping, stairs, transfers, and locomotion level  PARTICIPATION LIMITATIONS: meal prep, cleaning, laundry, driving, shopping, community activity, and yard work  PERSONAL FACTORS: Right Hip replacement 2022, Low back Pain, MS, TIA October 2023; MI, November 2022; CAD, Sjorgens syndrome are also affecting patient's functional outcome. MS  REHAB POTENTIAL: Good  CLINICAL DECISION MAKING: Evolving/moderate complexity declining mobility   EVALUATION COMPLEXITY: Moderate   GOALS: Goals reviewed with patient? No  SHORT TERM GOALS: Target date: 09/29/2022    Patient will increase gross bilateral lower extremity strength by 5 pounds Baseline: Goal status: has returned to baseline from a few progress notes ago 10/16  2.  Patient will increase lumbar flexion by 10 degrees Baseline:  Goal status:  mild improvement but still painful 10/16  3.  Patient will be independent with basic after exercise program and stretching program Baseline:  Goal status: has basic HEP achieved 10/16   LONG TERM GOALS: Target date: 09/29/2022     Patient will stand for greater than 30 minutes without increased pain Baseline:  Goal status: improving 5/15  continued improvement 10/16   2.  Patient will pick item up off the floor without increased low back pain Baseline:  Goal status:still having pain 5/15 improved but still painful 10/16   3.  Patient will ambulate community distances without report of increased low back pain Baseline:  Goal status:limited by pain but maintaining 7/25  4.  Patient will report improved ability to sleep through the night secondary to  back pain Baseline:  Goal status: continues to have pain at night 7/25    PLAN:  PT FREQUENCY: 1x/week  PT DURATION: 10 weeks   PLANNED INTERVENTIONS: Therapeutic exercises, Therapeutic activity, Neuromuscular re-education, Balance training, Gait training, Patient/Family education, Self Care, Joint mobilization, Stair training, DME instructions, Aquatic Therapy, Dry Needling, Spinal mobilization, Cryotherapy, Moist heat, Ionotophoresis 4mg /ml Dexamethasone, and Manual therapy.  PLAN FOR NEXT SESSION: Consider manual therapy to the back and hips, consider trigger point dry needling to back and hips.  Reviewed basic HEP.  Begin active strengthening of hips and core.  Begin gait training and functional mobility training.       [name] Corporal, Student-PT 10/28/2022, 12:26 PM   Lorayne Bender PT DPT   10/28/2023  I have reviewed and concur with this student's documentation.   Dessie Coma, PT 10/15/2023 8:35 AM   During this treatment session, the therapist was present, participating in and directing the treatment.

## 2023-10-15 ENCOUNTER — Encounter (HOSPITAL_BASED_OUTPATIENT_CLINIC_OR_DEPARTMENT_OTHER): Payer: Self-pay | Admitting: Physical Therapy

## 2023-10-20 ENCOUNTER — Ambulatory Visit (HOSPITAL_BASED_OUTPATIENT_CLINIC_OR_DEPARTMENT_OTHER): Payer: Medicare Other | Admitting: Physical Therapy

## 2023-10-28 ENCOUNTER — Ambulatory Visit (HOSPITAL_BASED_OUTPATIENT_CLINIC_OR_DEPARTMENT_OTHER): Payer: Medicare Other | Attending: Physical Medicine and Rehabilitation | Admitting: Physical Therapy

## 2023-10-28 ENCOUNTER — Encounter (HOSPITAL_BASED_OUTPATIENT_CLINIC_OR_DEPARTMENT_OTHER): Payer: Self-pay | Admitting: Physical Therapy

## 2023-10-28 DIAGNOSIS — R262 Difficulty in walking, not elsewhere classified: Secondary | ICD-10-CM | POA: Insufficient documentation

## 2023-10-28 DIAGNOSIS — M6281 Muscle weakness (generalized): Secondary | ICD-10-CM | POA: Insufficient documentation

## 2023-10-28 DIAGNOSIS — R252 Cramp and spasm: Secondary | ICD-10-CM | POA: Insufficient documentation

## 2023-10-28 DIAGNOSIS — M5459 Other low back pain: Secondary | ICD-10-CM | POA: Diagnosis not present

## 2023-10-28 NOTE — Therapy (Cosign Needed Addendum)
 OUTPATIENT PHYSICAL THERAPY THORACOLUMBAR Treatment/Progress note    Patient Name: Dawn [name] MRN: [id number] DOB:11/04/1945, 78 y.o., female Today's Date: 10/28/2023  END OF SESSION:  PT End of Session - 10/28/23 1125     Visit Number 51    Number of Visits 59    Date for PT Re-Evaluation 12/11/23    PT Start Time 1016    PT Stop Time 1059    PT Time Calculation (min) 43 min    Activity Tolerance Patient tolerated treatment well;No increased pain    Behavior During Therapy [facility] for tasks assessed/performed                        Past Medical History:  Diagnosis Date   Allergy    CAD (coronary artery disease)    s/p DES to RCA in 06/2021   Dry eyes    Endometrial polyp    Essential hypertension 08/25/2018   GERD (gastroesophageal reflux disease)    History of hiatal hernia    History of kidney stones    Hot flashes, menopausal 10/13/2011   Estradiol started    Hyperlipidemia    Jaundice as teenager   no problems since   Memory loss 09/21/2019     1/2 of feet numb all the time   Multiple sclerosis (HCC) dx 2001   Neuropathy    bilateral feet   Osteoarthritis    Sjogren's syndrome (HCC) dx oct 2021   sore muscvles, dry mouth and eyes   Stroke (HCC)    Vertigo    Vision abnormalities    Past Surgical History:  Procedure Laterality Date   ABDOMINAL HYSTERECTOMY  2002   partial   COLONOSCOPY  01/27/2022   2 day prep   colonscopy  2011   CORONARY STENT INTERVENTION N/A 07/18/2021   Procedure: CORONARY STENT INTERVENTION;  Surgeon: Corky Crafts, MD;  Location: [facility] INVASIVE CV [facility];  Service: Cardiovascular;  Laterality: N/A;   DILATATION & CURETTAGE/HYSTEROSCOPY WITH MYOSURE N/A 06/08/2020   Procedure: DILATATION & CURETTAGE/HYSTEROSCOPY/Polypectomy WITH MYOSURE;  Surgeon: Toy Baker, DO;  Location: [facility];  Service: Gynecology;  Laterality: N/A;   LEFT HEART CATH AND CORONARY ANGIOGRAPHY N/A 07/18/2021    Procedure: LEFT HEART CATH AND CORONARY ANGIOGRAPHY;  Surgeon: Corky Crafts, MD;  Location: [facility] INVASIVE CV LAB;  Service: Cardiovascular;  Laterality: N/A;   LUMBAR FUSION  2001   TOTAL HIP ARTHROPLASTY Right 01/17/2021   Procedure: TOTAL HIP ARTHROPLASTY ANTERIOR APPROACH;  Surgeon: Samson Frederic, MD;  Location: [facility];  Service: Orthopedics;  Laterality: Right;   UPPER GI ENDOSCOPY  yrs ago   Patient Active Problem List   Diagnosis Date Noted   UTI (urinary tract infection) 07/09/2023   OAB (overactive bladder) 07/03/2023   Statin myopathy 06/26/2023   History of CVA (cerebrovascular accident) 06/26/2023   Rheumatoid arthritis involving right hand, unspecified whether rheumatoid factor present (HCC) 05/22/2023   Atherosclerotic heart disease of native coronary artery with other forms of angina pectoris (HCC) 05/22/2023   Positive colorectal cancer screening using Cologuard test 11/14/2021   Coronary artery disease    Statin intolerance 04/19/2021   Osteoarthritis of right hip 01/17/2021   Status post THR (total hip replacement) 01/17/2021   Chronic bilateral low back pain without sciatica 10/02/2020   Chronic pain syndrome 06/20/2020   Post laminectomy syndrome 06/20/2020   Sjogren's disease (HCC) 05/23/2020   Primary osteoarthritis of both hands 05/23/2020   Primary  osteoarthritis of both feet 05/23/2020   Sjogren's syndrome (HCC) 05/23/2020   Other secondary scoliosis, lumbar region 12/22/2019   Spondylosis without myelopathy or radiculopathy, lumbar region 12/22/2019   Cervical radiculopathy 10/18/2019   Numbness 09/21/2019   Bilateral carpal tunnel syndrome 09/21/2019   High risk medication use 09/21/2019   Memory loss 09/21/2019   Essential hypertension 08/25/2018   Abnormal SPEP 02/19/2018   Hand pain 02/20/2017   Multiple joint pain 02/18/2017   Disturbed cognition 06/25/2016   Impaired cognition 06/25/2016   Sciatica, right side 10/30/2015   Chronic fatigue  08/04/2014   Gait disturbance 08/04/2014   Unspecified visual disturbance 01/31/2013   Transient vision disturbance 01/31/2013   Colon polyps 11/03/2011   POSTHERPETIC NEURALGIA 10/03/2009   DISPLCMT LUMBAR INTERVERT DISC W/O MYELOPATHY 09/05/2009   RENAL CALCULUS, RECURRENT 09/04/2009   Hyperlipidemia 08/31/2009   Multiple sclerosis (HCC) 08/31/2009   ALLERGIC RHINITIS 08/31/2009  Progress Note Reporting Period 07/23/2023 to 10/02/2023  See note below for Objective Data and Assessment of Progress/Goals.     PCP: Iantha Fallen NP  REFERRING PROVIDER: Dr Romero Belling   REFERRING DIAG: Low Back Pain   Rationale for Evaluation and Treatment: Rehabilitation  THERAPY DIAG:  Difficulty in walking, not elsewhere classified  Other low back pain  Cramp and spasm  Muscle weakness (generalized)  Chronic bilateral low back pain without sciatica  ONSET DATE: About 11 days ago her nerve abiliation wore off.   SUBJECTIVE:                                                                                                                                                                                           SUBJECTIVE STATEMENT: 4/9 Pt states she is feeling a good amount of pain on outsides of both hips but everything else is feeling good. Says she was feeling a little dizzy today.   PERTINENT HISTORY:  Right Hip replacement 2022, Low back Pain, MS, TIA October 2023; MI, November 2022; CAD, Sjorgens syndrome  PAIN:  Are you having pain? Yes: NPRS scale: 6/10 Pain location left side lumbar spine Pain description: aching  Aggravating factors: standing and walking  Relieving factors: rest    PRECAUTIONS: None  WEIGHT BEARING RESTRICTIONS: No  FALLS:  Has patient fallen in last 6 months? 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   LUMBAR ROM:   AROM eval 5/15 10/3  Flexion Limited 50 % with pain  Mild improvement but still about 50%  Improved pain but still limited  Extension No limit     Right lateral flexion     Left lateral flexion     Right rotation Limited 50% with pain     Left rotation Limited 50% with pain      (Blank rows = not tested)  LOWER EXTREMITY ROM:     Passive  Right eval Left eval Right  Right  7/24 Right 1/2  Hip flexion 105 90 105 110 with pain at end range  100 with pain   Hip extension   10 degrees from neutral before manual today. Too neutral after stretching     Hip abduction       Hip adduction       Hip internal rotation painful Painful  Painful  Full with less pain  Painful   Hip external rotation   20 degrees with pain  45 with pain  48 with pain   Knee flexion       Knee extension       Ankle dorsiflexion       Ankle plantarflexion       Ankle inversion       Ankle eversion        (Blank rows = not tested)  LOWER EXTREMITY MMT:    MMT Right eval Left eval Right 7/24 Left 7/24 Right  10/2 Left 10/2 right 1/2 left 1/2 Right  Left   Hip flexion 13.1 12.3 11.3 19.1 15.3 23.4 16.7 23.4 17.5 20.4  Hip extension            Hip abduction 11.5 10.1 12.6 17.8 23.8 28.5 25.6 27.9 26.6 20.9  Hip adduction            Hip internal rotation            Hip external rotation            Knee flexion            Knee extension 14.0 13.3 19.4 18.3 23.8  23.6 22.6 24.8 28.8 20.7  Ankle dorsiflexion            Ankle plantarflexion            Ankle inversion            Ankle eversion             (Blank rows = not tested)  FUNCTIONAL TESTS:  5 times sit to stand: 28 sec  5/15 5 times sit to stand test 20 seconds 7/25 24 sec  10/16 17 sec 1/2 17 seconds 3/14 18 sec   3-minute walk test 150 feet without requirement of seated rest break 5/15  7/25 3 min walk test 200'   10/16 3 min walk test 379'   3/14   GAIT:  TODAY'S TREATMENT:  DATE:  4/9 Manual: PROM performed with distraction to reduce pain and improve movement Seated glute stretch There-ex: Nu-step warm up lvl 3 Seated hip adductor ball squeezes 3x10 Seated LAQ with red RTB 3x10  Neuro-Re-ed  Step onto and off air-ex x20  Narrow base 3x30 sec hold  Standing marching 3x10   3/26 Manual: STM to low back paraspinals, lateral hip paraspinals and into the anterior hip on the left  There-ex: Nu-step 5 min L5  Ball roll out fwd x10  Ball roll out lateral x10 each direction LTR 2x15    Nuro-Re-ed  Ball press x20   At this time the patient began to report syncope. Her B?P was measured. She reports baseline vertigo.      3/14 Nu-step 5 min L5  Ball roll out fwd x10  Ball roll out lateral x10 each direction LTR 2x15    Strength testing  Neuro-re-ed   Ball press x20  Hip adduction with a bal l   Gait: walk test      2/26  There-ex: Nu-step 5 min L5  Ball roll out fwd x10  Ball roll out lateral x10 each direction  [employer] press x20   Neuro-re-ed  Row 3x10 red  Shoulder extnesion 3x10 red   Reviewed set up and rest   Step onto and off air-ex x20  Narrow base 3x30 sec hold  Heel/toe x20         2/12  There-ex   Nu-step 5 min L5  Ball roll out fwd x10   Ball roll out lateral x10 each direction  [employer] press x20    Neruo re-ed :   cuing for posture with all  Bilateral er red 3x15  Horizontal abduction 3x15 red  Bicpes curl 3lbs 3x15 red   Supine march with TA 3x10  Supine ball squeeze with TA 3x10 Supine hip abduction with TA 3x10   2/6 Subsequent Treatment: Instructions provided previously at initial dry needling treatment.  Instructions reviewed, if requested by the patient, prior to subsequent dry needling treatment.   Patient Verbal Consent Given: Yes Education Handout Provided: Previously Provided Muscles Treated: right gluteal x3 using a .30x650 needle. Right lumbar paraspinals at L1-L3 with care not to go into area of previous laminectomy. + Electrical Stimulation Performed: No Treatment Response/Outcome: great twitch   Manual: trigger point release to gluteal and lower lumbar spine with focus on the left side.. Trigger point release to hips and spine; Roller to anterior hip.   There-ex:  LTR x20  Hip abduction 2x15 red   Neuro-re-ed Row with abdominal breathing 3 x 10 red Extension 3 x 10 red with abdominal breathing     1/29 Trigger Point Dry Needling  Subsequent Treatment: Instructions provided previously at initial dry needling treatment.  Instructions reviewed, if requested by the patient, prior to subsequent dry needling treatment.   Patient Verbal Consent Given: Yes Education Handout Provided: Previously Provided Muscles Treated: right gluteal x3 using a .30x650 needle. Right lumbar paraspinals at L1-L3 with care not to go into area of previous laminectomy. + Electrical Stimulation Performed: No Treatment Response/Outcome: great twitch     Manual: trigger point release to gluteal and lower lumbar spine with focus on the left side.. Trigger point release to hips and spine; Roller to anterior hip.     LTR 2x20  SAQ 3 x 15 each leg Supine hip abduction 3 x 15 red band 1/15  Manual: trigger  point release to gluteal and lower lumbar spine with focus on the left side.. Trigger point release to hips and spine; Roller to anterior hip.   LTR 2x20  SAQ 3 x 15 each leg Supine hip abduction 3 x 15 red band Trigger Point Dry Needling  Subsequent Treatment: Instructions provided previously at initial dry needling treatment.  Instructions reviewed, if requested by the patient, prior to subsequent dry needling treatment.   Patient Verbal Consent Given: Yes Education Handout Provided: Previously Provided Muscles Treated: right gluteal x3 using a .30x650 needle.  Electrical Stimulation Performed: No Treatment Response/Outcome: great twitch     1/2 Manual: trigger point release to gluteal and lower lumbar spine with focus on the left side.. Trigger point release to hips and spine; Roller to anterior hip.   LTR 2x20  SAQ 3 x 15 each leg Supine hip abduction 3 x 15 red band  LAQ 2 x 15  Standing: Squat to heel raise 3 x 10  Reviewed strength and functional measurements        PATIENT EDUCATION:  Education details: reviewed HEP, symptom management  Person educated: Patient Education method: Explanation, Demonstration, Tactile cues, Verbal cues, and Handouts Education comprehension: verbalized understanding, returned demonstration, verbal cues required, tactile cues required, and needs further education  HOME EXERCISE PROGRAM: Has previous HEP. Will review next visit  ASSESSMENT:  CLINICAL IMPRESSION: 4/9 Pt warmed up on the nu-step for . Pt reported feeling a little dizzy from vertigo so adjustments to care were made focusing on limiting changing positions. A glute stretch was performed in sitting which pt said reduced her lateral hip symptoms. Therapeutic exercises were performed with postural and muscle activation cueing provided. Pt tolerated these well. Balance activities were done for the remainder of the visit. We focused on maintaining upright position and  education was provided on at home techniques. We emphasized safety doing all of these interventions but especially balance ones to reduce fall risk. Pt will continue to benefit from skilled physical therapy to increase functional endurance and balance for ADLs and ambulation.     OBJECTIVE IMPAIRMENTS: Abnormal gait, decreased activity tolerance, decreased balance, decreased endurance, decreased knowledge of use of DME, decreased mobility, difficulty walking, decreased ROM, decreased strength, increased muscle spasms, and pain.   ACTIVITY LIMITATIONS: carrying, lifting, bending, standing, squatting, sleeping, stairs, transfers, and locomotion level  PARTICIPATION LIMITATIONS: meal prep, cleaning, laundry, driving, shopping, community activity, and yard work  PERSONAL FACTORS: Right Hip replacement 2022, Low back Pain, MS, TIA October 2023; MI, November 2022; CAD, Sjorgens syndrome are also affecting patient's functional outcome. MS  REHAB POTENTIAL: Good  CLINICAL DECISION MAKING: Evolving/moderate complexity declining mobility   EVALUATION COMPLEXITY: Moderate   GOALS: Goals reviewed with patient? No  SHORT TERM GOALS: Target date: 09/29/2022    Patient will increase gross bilateral lower extremity strength by 5 pounds Baseline: Goal status: has returned to baseline from a few progress notes ago 10/16  2.  Patient will increase lumbar flexion by 10 degrees Baseline:  Goal status:  mild improvement but still painful 10/16  3.  Patient will be independent with basic after exercise program and stretching program Baseline:  Goal status: has basic HEP achieved 10/16   LONG TERM GOALS: Target date: 09/29/2022     Patient will stand for greater than 30 minutes without increased pain Baseline:  Goal status: improving 5/15  continued improvement 10/16   2.  Patient will pick item up off the floor without increased low back pain  Baseline:  Goal status:still having pain 5/15  improved but still painful 10/16   3.  Patient will ambulate community distances without report of increased low back pain Baseline:  Goal status:limited by pain but maintaining 7/25  4.  Patient will report improved ability to sleep through the night secondary to back pain Baseline:  Goal status: continues to have pain at night 7/25    PLAN:  PT FREQUENCY: 1x/week  PT DURATION: 10 weeks   PLANNED INTERVENTIONS: Therapeutic exercises, Therapeutic activity, Neuromuscular re-education, Balance training, Gait training, Patient/Family education, Self Care, Joint mobilization, Stair training, DME instructions, Aquatic Therapy, Dry Needling, Spinal mobilization, Cryotherapy, Moist heat, Ionotophoresis 4mg /ml Dexamethasone, and Manual therapy.  PLAN FOR NEXT SESSION: Consider manual therapy to the back and hips, consider trigger point dry needling to back and hips.  Reviewed basic HEP.  Begin active strengthening of hips and core.  Begin gait training and functional mobility training.       [name] Corporal, Student-PT 10/28/2022, 12:12 PM   Lorayne Bender PT DPT   10/28/2023  I have reviewed and concur with this student's documentation.   [name] Corporal, Student-PT 10/28/2023 12:12 PM   During this treatment session, the therapist was present, participating in and directing the treatment.

## 2023-11-02 ENCOUNTER — Encounter: Payer: Self-pay | Admitting: Obstetrics

## 2023-11-02 ENCOUNTER — Encounter: Payer: Self-pay | Admitting: Family

## 2023-11-02 ENCOUNTER — Ambulatory Visit (INDEPENDENT_AMBULATORY_CARE_PROVIDER_SITE_OTHER): Payer: Medicare Other | Admitting: Family

## 2023-11-02 ENCOUNTER — Ambulatory Visit (INDEPENDENT_AMBULATORY_CARE_PROVIDER_SITE_OTHER): Admitting: Obstetrics

## 2023-11-02 VITALS — BP 138/80 | HR 66 | Temp 97.8°F | Resp 18 | Ht 62.0 in | Wt 189.2 lb

## 2023-11-02 VITALS — BP 166/89 | HR 73 | Ht 62.6 in | Wt 188.0 lb

## 2023-11-02 DIAGNOSIS — R319 Hematuria, unspecified: Secondary | ICD-10-CM | POA: Diagnosis not present

## 2023-11-02 DIAGNOSIS — G35 Multiple sclerosis: Secondary | ICD-10-CM | POA: Diagnosis not present

## 2023-11-02 DIAGNOSIS — E782 Mixed hyperlipidemia: Secondary | ICD-10-CM

## 2023-11-02 DIAGNOSIS — I5032 Chronic diastolic (congestive) heart failure: Secondary | ICD-10-CM | POA: Diagnosis not present

## 2023-11-02 DIAGNOSIS — K5900 Constipation, unspecified: Secondary | ICD-10-CM | POA: Insufficient documentation

## 2023-11-02 DIAGNOSIS — I10 Essential (primary) hypertension: Secondary | ICD-10-CM

## 2023-11-02 DIAGNOSIS — N3281 Overactive bladder: Secondary | ICD-10-CM

## 2023-11-02 DIAGNOSIS — Z87898 Personal history of other specified conditions: Secondary | ICD-10-CM | POA: Diagnosis not present

## 2023-11-02 DIAGNOSIS — M069 Rheumatoid arthritis, unspecified: Secondary | ICD-10-CM

## 2023-11-02 DIAGNOSIS — M47816 Spondylosis without myelopathy or radiculopathy, lumbar region: Secondary | ICD-10-CM | POA: Diagnosis not present

## 2023-11-02 DIAGNOSIS — N390 Urinary tract infection, site not specified: Secondary | ICD-10-CM | POA: Diagnosis not present

## 2023-11-02 DIAGNOSIS — Z23 Encounter for immunization: Secondary | ICD-10-CM | POA: Diagnosis not present

## 2023-11-02 LAB — POCT URINALYSIS DIPSTICK
Bilirubin, UA: NEGATIVE
Bilirubin, UA: NEGATIVE
Blood, UA: NEGATIVE
Blood, UA: NEGATIVE
Glucose, UA: NEGATIVE
Glucose, UA: NEGATIVE
Ketones, UA: NEGATIVE
Ketones, UA: NEGATIVE
Leukocytes, UA: NEGATIVE
Nitrite, UA: NEGATIVE
Nitrite, UA: NEGATIVE
Protein, UA: NEGATIVE
Protein, UA: NEGATIVE
Spec Grav, UA: 1.015 (ref 1.010–1.025)
Spec Grav, UA: 1.015 (ref 1.010–1.025)
Urobilinogen, UA: 0.2 U/dL
Urobilinogen, UA: 0.2 U/dL
pH, UA: 5.5 (ref 5.0–8.0)
pH, UA: 5.5 (ref 5.0–8.0)

## 2023-11-02 MED ORDER — GEMTESA 75 MG PO TABS: 75.0000 mg | ORAL_TABLET | Freq: Every day | ORAL | 2 refills | Status: DC

## 2023-11-02 MED ORDER — LOSARTAN POTASSIUM 100 MG PO TABS: 100.0000 mg | ORAL_TABLET | Freq: Every day | ORAL | 1 refills | Status: DC

## 2023-11-02 MED ORDER — ESTRADIOL 0.1 MG/GM VA CREA: TOPICAL_CREAM | VAGINAL | 3 refills | Status: DC

## 2023-11-02 MED ORDER — GEMTESA 75 MG PO TABS: 75.0000 mg | ORAL_TABLET | Freq: Every day | ORAL | Status: DC

## 2023-11-02 NOTE — Assessment & Plan Note (Addendum)
-   catheterized for 60mL with negative UA - We discussed the symptoms of overactive bladder (OAB), which include urinary urgency, urinary frequency, nocturia, with or without urge incontinence.  While we do not know the exact etiology of OAB, several treatment options exist. We discussed management including behavioral therapy (decreasing bladder irritants, urge suppression strategies, timed voids, bladder retraining), physical therapy, medication; for refractory cases posterior tibial nerve stimulation, sacral neuromodulation, and intravesical botulinum toxin injection.  For anticholinergic medications, we discussed the potential side effects of anticholinergics including dry eyes, dry mouth, constipation, cognitive impairment and urinary retention. For Beta-3 agonist medication, we discussed the potential side effect of elevated blood pressure which is more likely to occur in individuals with uncontrolled hypertension. - OAB medications tried: oxybutynin 5 mg twice daily, VESIcare, Tamsulosin,Tolterodine, Myrbetriq, and Gemtesa  - Rx Gemtesa sent to [facility], discussed possible [place] pharmacy if cost prohibitive  - referral to pelvic floor PT

## 2023-11-02 NOTE — Assessment & Plan Note (Addendum)
-   For constipation, we reviewed the importance of a better bowel regimen.  We also discussed the importance of avoiding chronic straining, as it can exacerbate her pelvic floor symptoms; we discussed treating constipation and straining prior to surgery, as postoperative straining can lead to damage to the repair and recurrence of symptoms. We discussed initiating therapy with increasing fluid intake, fiber supplementation, stool softeners, and laxatives such as miralax.   -encouraged titration of miralax and continue dietary fiber supplementation with squatting position for defecation - continue pelvic floor PT to improve pelvic floor relaxation due to high tone pelvic floor

## 2023-11-02 NOTE — Progress Notes (Signed)
 New Patient Evaluation and Consultation  Referring Provider: [name], Elijio [place], NP PCP: Estil Heman, NP Date of Service: 11/02/2023  SUBJECTIVE Chief Complaint: New Patient (Initial Visit) (Dawn [name] is a 78 y.o. female here today for over active bladder/)  History of Present Illness: Dawn [name] is a 78 y.o. White or Caucasian female seen in consultation at the request of NP [name] for evaluation of overactive bladder and UTI.    Diagnosed with multiple sclerosus in 2001 with LP after left sided foot drop, blindness, nystagmus, double vision, and frequent falling shortly after hysterectomy. MS managed by Dr. Godwin Lat. Ambulation improved with residual weakness on the left side. Stopped treatments around 3 years ago with Leflunomide, now without medication due to progression that started 6-7 years ago.  reports bowel symptoms started around 2010-2011 with constipation and started miralax. Now managed by ground flax seed and diet fiber. Urinary symptoms since 2011-2012 with frequency and worsened over the years. Started Vesicare, oxybutynin in the past from neurology, tried Gemtesa samples in the past with relief of urinary frequency and decreased bladder discomfort.  OAB medications tried: oxybutynin 5 mg twice daily, VESIcare, Tamsulosin,Tolterodine, Myrbetriq, and Gemtesa  Reports back pain from herniated disc since 2001, denies relief of symptoms with back surgery. Managed by ablation from Dr. Ibazebo (orthopedic surgery) around 3 months ago with improvement of back pain. Undergoing [facility] for back pain. Tried piriformis muscle injections in the past. Started tizanidine for back pain around 06/2023. Left handed, denies CIC or need for catheterization.  Reports colonoscopy in 2023 due to blood in urine testing and positive cologuard.  Currently in a support group and became interested in [place]. Unable to obtain gemtesa prescription.  Returned to [employer] and stopped  medications in the past 2 weeks with improvement of urinary symptoms. Reports seeing urology 15 years ago due to kidney stones, resolved after lemon water at 3 month follow-up.  Previously on oral HRT until 4 years ago, reports UTI symptoms since 4 years ago H/o hysteroscopy, D&C with endometrial polyp in 06/08/2020 by Dr. Arnett Lanius for postmenopausal bleeding ASA for left medial parietal CVA in 03/2022 and history of left PICA cerebellar stroke  History of Plavix use for CHF and h/o cardiac stent placement  Review of records significant for:  MR lumbar spine w/o contrast 09/01/22 "CLINICAL DATA:  Chronic low back pain with bilateral hip pain for 3 years. Bilateral foot numbness and leg weakness. No known injury. Remote back surgery in 2001.   EXAM: MRI LUMBAR SPINE WITHOUT CONTRAST   TECHNIQUE: Multiplanar, multisequence MR imaging of the lumbar spine was performed. No intravenous contrast was administered.   COMPARISON:  Lumbar spine radiographs 12/20/2019. Lumbar MRI 05/24/2021.   FINDINGS: Segmentation: Conventional anatomy assumed, with the last open disc space designated L5-S1.Concordant with prior imaging.   Alignment:  Stable scoliosis and minimal retrolisthesis at L2-3.   Vertebrae: No worrisome osseous lesion, acute fracture or pars defect. Chronic endplate degenerative changes asymmetric to the left at L2-3. The visualized sacroiliac joints appear unremarkable.   Conus medullaris: Extends to the L1 level and appears normal.   Paraspinal and other soft tissues: No significant paraspinal findings. Small renal cysts bilaterally without suspicious features; no follow-up imaging recommended.   Disc levels:   Sagittal images demonstrate stable disc bulging eccentric to the left at T11-12. No resulting significant spinal stenosis or nerve root encroachment.   No significant disc space findings at T12-L1 or L1-2.   L2-3: Chronic degenerative disc  disease with loss of disc height,  annular disc bulging and endplate osteophytes asymmetric to the left. There is chronic moderate narrowing of the left lateral recess and left foramen which appears unchanged. The right foramen is mildly narrowed.   L3-4: Mild loss of disc height with annular disc bulging, facet and ligamentous hypertrophy. Unchanged mild spinal stenosis and mild lateral recess and foraminal narrowing bilaterally.   L4-5: Mild loss of disc height with annular disc bulging and endplate osteophytes asymmetric to the right. Mild facet and ligamentous hypertrophy. Unchanged mild spinal stenosis with right greater than left lateral recess and foraminal narrowing.   L5-S1: Remote posterior decompression. The spinal canal is widely patent. Mild loss of disc height with annular disc bulging and endplate osteophytes asymmetric to the right. Asymmetric right-sided facet hypertrophy. Unchanged moderate to severe right foraminal narrowing.   IMPRESSION: 1. No acute findings or clear explanation for the patient's symptoms. 2. Stable multilevel spondylosis compared with previous MRI from 05/24/2021. 3. Chronic narrowing of the left lateral recess and left foramen at L2-3. 4. Mild multifactorial spinal stenosis at L3-4 and L4-5. 5. Remote posterior decompression at L5-S1 with unchanged moderate to severe right foraminal narrowing.     Electronically Signed   By: Elmon Hagedorn M.D.   On: 09/01/2022 10:47"    Urinary Symptoms: Does not leak urine.   Day time voids 10.  Nocturia: 2-3 times per night to void. Denies snoring or diagnosis of OSA Stops drinking water around 8pm and sleeps around 11pm  Denies leg swelling Voiding dysfunction:  empties bladder well.  Patient does not use a catheter to empty bladder.  When urinating, patient feels a weak stream, difficulty starting urine stream, dribbling after finishing, and the need to urinate multiple times in a row Drinks: 48oz water per day, 8oz coffee, 8oz  decaffeinated tea   UTIs: 3 UTI's in the last year.  Previously managed by [facility] UTI symptoms: generalized itching, burning with urination, hematuria, and pain Denies history of blood in urine, pyelonephritis, bladder cancer, and kidney cancer Reports kidney or bladder stones with urology evaluation around 15 years ago Susceptibility data from last 90 days. Collected Specimen Info Organism AMOXICILLIN/CLAVULANATE AMPICILLIN AMPICILLIN/SULBACTAM CEFAZOLIN CEFEPIME Ceftazidime CEFTRIAXONE Ciprofloxacin Gentamicin Susc lslt Imipenem LEVOFLOXACIN Nitrofurantoin Susc lslt  09/28/23 Urine Escherichia coli  R  R  R  R  S  S  S  S  S  S  S  S   Collected Specimen Info Organism Piperacillin + Tazobactam Tobramycin Susc lslt Trimethoprim/Sulfa  09/28/23 Urine Escherichia coli  S  S  S    Pelvic Organ Prolapse Symptoms:                  Patient Denies a feeling of a bulge the vaginal area.   Bowel Symptom: Bowel movements: 0-2 time(s) per day Stool consistency: hard Straining: no.  Splinting: no.  Incomplete evacuation: yes.  Patient Denies accidental bowel leakage / fecal incontinence Bowel regimen: diet, fiber, and ground flax seed Last colonoscopy: Date 01/27/2022, Results hemorrhoids, polypectomy HM Colonoscopy   This patient has no relevant Health Maintenance data.     Sexual Function Sexually active: no.  Sexual orientation: Straight Pain with sex: No  Pelvic Pain Admits to pelvic pain Location: lower stomach Pain occurs: varies Prior pain treatment: medications for bladder frequency Improved by: no UTIs, bowel movements, calm bller Worsened by: UTIs, no bowel movements, increased urinary frequency and urge to void   Past Medical History:  Past  Medical History:  Diagnosis Date   Allergy    CAD (coronary artery disease)    s/p DES to RCA in 06/2021   Dry eyes    Endometrial polyp    Essential hypertension 08/25/2018   GERD (gastroesophageal reflux disease)    History of  hiatal hernia    History of kidney stones    Hot flashes, menopausal 10/13/2011   Estradiol started    Hyperlipidemia    Jaundice as teenager   no problems since   Memory loss 09/21/2019     1/2 of feet numb all the time   Multiple sclerosis (HCC) dx 2001   Neuropathy    bilateral feet   Osteoarthritis    Sjogren's syndrome (HCC) dx oct 2021   sore muscvles, dry mouth and eyes   Stroke [facility])    Vertigo    Vision abnormalities      Past Surgical History:   Past Surgical History:  Procedure Laterality Date   ABDOMINAL HYSTERECTOMY  2002   partial   COLONOSCOPY  01/27/2022   2 day prep   colonscopy  2011   CORONARY STENT INTERVENTION N/A 07/18/2021   Procedure: CORONARY STENT INTERVENTION;  Surgeon: Corky Crafts, MD;  Location: [facility] INVASIVE CV LAB;  Service: Cardiovascular;  Laterality: N/A;   DILATATION & CURETTAGE/HYSTEROSCOPY WITH MYOSURE N/A 06/08/2020   Procedure: DILATATION & CURETTAGE/HYSTEROSCOPY/Polypectomy WITH MYOSURE;  Surgeon: Toy Baker, DO;  Location: [facility];  Service: Gynecology;  Laterality: N/A;   LEFT HEART CATH AND CORONARY ANGIOGRAPHY N/A 07/18/2021   Procedure: LEFT HEART CATH AND CORONARY ANGIOGRAPHY;  Surgeon: Corky Crafts, MD;  Location: [facility] INVASIVE CV LAB;  Service: Cardiovascular;  Laterality: N/A;   LUMBAR FUSION  2001   TOTAL HIP ARTHROPLASTY Right 01/17/2021   Procedure: TOTAL HIP ARTHROPLASTY ANTERIOR APPROACH;  Surgeon: Samson Frederic, MD;  Location: [facility] ORS;  Service: Orthopedics;  Laterality: Right;   UPPER GI ENDOSCOPY  yrs ago     Past OB/GYN History: OB History  Gravida Para Term Preterm AB Living  1    1 0  SAB IAB Ectopic Multiple Live Births  1        # Outcome Date GA Lbr Len/2nd Weight Sex Type Anes PTL Lv  1 SAB             Vaginal deliveries: 0,  Forceps/ Vacuum deliveries: 0, Cesarean section: 0 Menopausal: Yes, at age 78, Denies vaginal bleeding since menopause Contraception: s/p  menopause. Last pap smear was 12/2010 normal per chart review.  Any history of abnormal pap smears: no. No results found for: "DIAGPAP", "HPVHIGH", "ADEQPAP"  Medications: Patient has a current medication list which includes the following prescription(s): acetaminophen, aspirin ec, carvedilol, cholecalciferol, clonazepam, [START ON 11/05/2023] estradiol, ezetimibe, fexofenadine, flaxseed (linseed), fluticasone, hydralazine, lidocaine, melatonin, modafinil, nitroglycerin, omega-3 fatty acids, pantoprazole, tizanidine, gemtesa, gemtesa, and losartan.   Allergies: Patient is allergic to repatha [evolocumab], crestor [rosuvastatin], pravastatin, and statins.   Social History:  Social History   Tobacco Use   Smoking status: Former    Current packs/day: 0.00    Average packs/day: 0.5 packs/day for 15.0 years (7.5 ttl pk-yrs)    Types: Cigarettes    Start date: 08/04/1968    Quit date: 08/05/1983    Years since quitting: 40.2   Smokeless tobacco: Never  Vaping Use   Vaping status: Never Used  Substance Use Topics   Alcohol use: Yes    Comment: rarely, maybe yearly drink  Drug use: Never    Relationship status: widowed Patient lives by herself.   Patient is not employed. Regular exercise: Yes: stretching, movement, swimming History of abuse: No  Family History:   Family History  Problem Relation Age of Onset   Heart failure Mother    Heart failure Father    Arthritis Other    Hypertension Other    Colon cancer Neg Hx    Colon polyps Neg Hx    Esophageal cancer Neg Hx    Stomach cancer Neg Hx    Rectal cancer Neg Hx    Bladder Cancer Neg Hx    Uterine cancer Neg Hx    Kidney cancer Neg Hx      Review of Systems: Review of Systems  Constitutional:  Positive for malaise/fatigue. Negative for fever and weight loss.  Respiratory:  Negative for cough, shortness of breath and wheezing.   Cardiovascular:  Negative for chest pain, palpitations and leg swelling.  Gastrointestinal:   Negative for abdominal pain, blood in stool and constipation.  Genitourinary:  Negative for dysuria, frequency, hematuria and urgency.       Vaginal discharge  Skin:  Negative for rash.  Neurological:  Positive for dizziness and weakness. Negative for headaches.  Endo/Heme/Allergies:  Does not bruise/bleed easily.       Hot flashes  Psychiatric/Behavioral:  Negative for depression. The patient is not nervous/anxious.      OBJECTIVE Physical Exam: Vitals:   11/02/23 0807  BP: (!) 166/89  Pulse: 73  Weight: 188 lb (85.3 kg)  Height: 5' 2.6" (1.59 m)    Physical Exam Constitutional:      General: She is not in acute distress.    Appearance: Normal appearance.  Genitourinary:     Bladder and urethral meatus normal.     No lesions in the vagina.     Right Labia: No rash, tenderness, lesions, skin changes or Bartholin's cyst.    Left Labia: No tenderness, lesions, skin changes, Bartholin's cyst or rash.    No vaginal discharge, erythema, tenderness, bleeding, ulceration or granulation tissue.     Posterior vaginal prolapse present.    Severe vaginal atrophy present.     Right Adnexa: not tender, not full and no mass present.    Left Adnexa: not tender, not full and no mass present.    Cervix is absent.     Uterus is absent.     Urethral meatus caruncle not present.    No urethral prolapse, tenderness, mass, hypermobility, discharge or stress urinary incontinence with cough stress test present.     Bladder is not tender, urgency on palpation not present and masses not present.      Pelvic Floor: Levator muscle strength is 3/5.    Levator ani not tender, obturator internus not tender, no asymmetrical contractions present and no pelvic spasms present.    Pelvic Floor comments: High tone pelvic floor.    Symmetrical pelvic sensation, anal wink present and BC reflex present. Cardiovascular:     Rate and Rhythm: Normal rate.  Pulmonary:     Effort: Pulmonary effort is normal. No  respiratory distress.  Abdominal:     General: There is no distension.     Palpations: Abdomen is soft. There is no mass.     Tenderness: There is no abdominal tenderness.     Hernia: No hernia is present.    Neurological:     Mental Status: She is alert.  Vitals reviewed. Exam conducted with a chaperone  present.      POP-Q:   POP-Q  -3                                            Aa   -3                                           Ba  -8                                              C   1                                            Gh  4                                            Pb  8                                            tvl   -2                                            Ap  -2                                            Bp                                                 D     Post-Void Residual (PVR) by Bladder Scan: In order to evaluate bladder emptying, we discussed obtaining a postvoid residual and patient agreed to this procedure.  Procedure: The ultrasound unit was placed on the patient's abdomen in the suprapubic region after the patient had voided.    Post Void Residual - 11/02/23 0920       Post Void Residual   Post Void Residual 60 mL   cath sample           Straight Catheterization Procedure for PVR: After verbal consent was obtained from the patient for catheterization to assess bladder emptying and residual volume the urethra and surrounding tissues were prepped with betadine and an in and out catheterization was performed.  PVR was 60mL.  Urine appeared clear yellow. The patient tolerated the procedure well.   Laboratory Results: Lab Results  Component Value Date   COLORU Yellow 11/02/2023   CLARITYU Clear 11/02/2023   GLUCOSEUR Negative 11/02/2023   BILIRUBINUR  Negative 11/02/2023   KETONESU Negative 11/02/2023   SPECGRAV 1.015 11/02/2023   RBCUR Negative 11/02/2023   PHUR 5.5 11/02/2023   PROTEINUR Negative 11/02/2023    UROBILINOGEN 0.2 11/02/2023   LEUKOCYTESUR Negative 11/02/2023    Lab Results  Component Value Date   CREATININE 0.69 10/22/2022   CREATININE 0.68 07/23/2022   CREATININE 0.70 05/14/2022    Lab Results  Component Value Date   HGBA1C 5.4 06/15/2021    Lab Results  Component Value Date   HGB 13.9 05/22/2023     ASSESSMENT AND PLAN Ms. Tagliaferro is a 78 y.o. with:  1. OAB (overactive bladder)   2. Multiple sclerosis (HCC)   3. History of gross hematuria   4. Constipation, unspecified constipation type   5. Urinary tract infection with hematuria, site unspecified   6. Spondylosis without myelopathy or radiculopathy, lumbar region     OAB (overactive bladder) Assessment & Plan: - catheterized for 60mL with negative UA - We discussed the symptoms of overactive bladder (OAB), which include urinary urgency, urinary frequency, nocturia, with or without urge incontinence.  While we do not know the exact etiology of OAB, several treatment options exist. We discussed management including behavioral therapy (decreasing bladder irritants, urge suppression strategies, timed voids, bladder retraining), physical therapy, medication; for refractory cases posterior tibial nerve stimulation, sacral neuromodulation, and intravesical botulinum toxin injection.  For anticholinergic medications, we discussed the potential side effects of anticholinergics including dry eyes, dry mouth, constipation, cognitive impairment and urinary retention. For Beta-3 agonist medication, we discussed the potential side effect of elevated blood pressure which is more likely to occur in individuals with uncontrolled hypertension. - OAB medications tried: oxybutynin 5 mg twice daily, VESIcare, Tamsulosin,Tolterodine, Myrbetriq, and Gemtesa  - Rx Gemtesa sent to [facility], discussed possible [place] pharmacy if cost prohibitive  - referral to pelvic floor PT  Orders: -     POCT urinalysis dipstick -     Gemtesa; Take 1  tablet (75 mg total) by mouth daily. (Patient not taking: Reported on 11/02/2023)  Dispense: 30 tablet; Refill: 2 -     Gemtesa; Take 1 tablet (75 mg total) by mouth daily. -     CT ABDOMEN PELVIS W WO CONTRAST; Future -     AMB referral to rehabilitation -     POCT urinalysis dipstick  Multiple sclerosis (HCC) Assessment & Plan: - discussed increase risk of neurogenic bladder, abnormal compliance and DSD due to history of multiple sclerosis, CVA, spinal stenosis and h/o back surgery. Discussed increased risk of refractory OAB.  - reviewed urodynamics + VCUG vs. Video urodynamics with urology, patient desires to proceed with UDS in this office. - left handed, denies need for CIC in the past.  - Cr 0.69 in 10/22/22  Orders: -     CT ABDOMEN PELVIS W WO CONTRAST; Future -     AMB referral to rehabilitation  History of gross hematuria Assessment & Plan: - history of gross hematuria, denies urology workup - prior hysteroscopy, D&C with polyp for PMB and colonoscopy with polyps and hemorrhoids - For management of gross hematuria, we discussed the importance of work-up including assessing the upper and lower GU tract with CT urogram and cystoscopy. We will also check her creatinine today if there is not a recent value in the system. She will pursue this work-up and follow-up afterward to discuss the results and decide on a treatment plan based on the findings.  - pt to schedule CT urogram and cystoscopy  Orders: -     Estradiol; Place 0.5-1g nightly for two weeks then twice a week after (Patient not taking: Reported on 11/02/2023)  Dispense: 30 g; Refill: 3 -     CT ABDOMEN PELVIS W WO CONTRAST; Future  Constipation, unspecified constipation type Assessment & Plan: - For constipation, we reviewed the importance of a better bowel regimen.  We also discussed the importance of avoiding chronic straining, as it can exacerbate her pelvic floor symptoms; we discussed treating constipation and straining  prior to surgery, as postoperative straining can lead to damage to the repair and recurrence of symptoms. We discussed initiating therapy with increasing fluid intake, fiber supplementation, stool softeners, and laxatives such as miralax.   -encouraged titration of miralax and continue dietary fiber supplementation with squatting position for defecation - continue pelvic floor PT to improve pelvic floor relaxation due to high tone pelvic floor  Orders: -     AMB referral to rehabilitation  Urinary tract infection with hematuria, site unspecified Assessment & Plan: - history of kidney stones - denies history of pyelonephritis - For treatment of recurrent urinary tract infections, we discussed management of recurrent UTIs including prophylaxis with a daily low dose antibiotic, transvaginal estrogen therapy, D-mannose, and cranberry supplements.  We discussed the role of diagnostic testing such as cystoscopy and upper tract imaging. - start with low dose vaginal estrogen with Rx provided - pending CT urogram and cystoscopy due to h/o gross hematuria     Spondylosis without myelopathy or radiculopathy, lumbar region Assessment & Plan: - ambulates with walker - lumbar MRI 2024 with spinal stenosis L3-5, chronic posterior decompression at L5-S1 with unchanged moderate to severe right foraminal narrowing. - back pain from herniated disc since 2001, denies relief of symptoms with back surgery. Managed by ablation from Dr. Ibazebo (orthopedic surgery) with relief, undergoing [facility], and tried piriformis muscle injections in the past. Started tizanidine around 06/2023 with improvement of urinary symptoms around 2 months ago.  - encouraged to consider pelvic floor PT and continue treatment and PT for back pain - discussed possible association with urinary symptoms   Time spent: I spent 69 minutes dedicated to the care of this patient on the date of this encounter to include pre-visit review of  records, face-to-face time with the patient discussing neurogenic bladder, multiple sclerosis, spinal stenosis, UTI, history of gross hematuria, constipation, and post visit documentation and ordering medication/ testing.   Darlene Ehlers, MD

## 2023-11-02 NOTE — Assessment & Plan Note (Signed)
-   discussed increase risk of neurogenic bladder, abnormal compliance and DSD due to history of multiple sclerosis, CVA, spinal stenosis and h/o back surgery. Discussed increased risk of refractory OAB.  - reviewed urodynamics + VCUG vs. Video urodynamics with urology, patient desires to proceed with UDS in this office. - left handed, denies need for CIC in the past.  - Cr 0.69 in 10/22/22

## 2023-11-02 NOTE — Assessment & Plan Note (Addendum)
-   ambulates with walker - lumbar MRI 2024 with spinal stenosis L3-5, chronic posterior decompression at L5-S1 with unchanged moderate to severe right foraminal narrowing. - back pain from herniated disc since 2001, denies relief of symptoms with back surgery. Managed by ablation from Dr. Ibazebo (orthopedic surgery) with relief, undergoing [facility], and tried piriformis muscle injections in the past. Started tizanidine around 06/2023 with improvement of urinary symptoms around 2 months ago.  - encouraged to consider pelvic floor PT and continue treatment and PT for back pain - discussed possible association with urinary symptoms

## 2023-11-02 NOTE — Assessment & Plan Note (Signed)
-   history of gross hematuria, denies urology workup - prior hysteroscopy, D&C with polyp for PMB and colonoscopy with polyps and hemorrhoids - For management of gross hematuria, we discussed the importance of work-up including assessing the upper and lower GU tract with CT urogram and cystoscopy. We will also check her creatinine today if there is not a recent value in the system. She will pursue this work-up and follow-up afterward to discuss the results and decide on a treatment plan based on the findings.  - pt to schedule CT urogram and cystoscopy

## 2023-11-02 NOTE — Assessment & Plan Note (Signed)
-   history of kidney stones - denies history of pyelonephritis - For treatment of recurrent urinary tract infections, we discussed management of recurrent UTIs including prophylaxis with a daily low dose antibiotic, transvaginal estrogen therapy, D-mannose, and cranberry supplements.  We discussed the role of diagnostic testing such as cystoscopy and upper tract imaging. - start with low dose vaginal estrogen with Rx provided - pending CT urogram and cystoscopy due to h/o gross hematuria

## 2023-11-02 NOTE — Patient Instructions (Addendum)
 We discussed the symptoms of overactive bladder (OAB), which include urinary urgency, urinary frequency, night-time urination, with or without urge incontinence.  We discussed management including behavioral therapy (decreasing bladder irritants by following a bladder diet, urge suppression strategies, timed voids, bladder retraining), physical therapy, medication; and for refractory cases posterior tibial nerve stimulation, sacral neuromodulation, and intravesical botulinum toxin injection.   For Beta-3 agonist medication, we discussed the potential side effect of elevated blood pressure which is more likely to occur in individuals with uncontrolled hypertension. You were given samples for Gemtesa 75 mg.  It can take a month to start working so give it time, but if you have bothersome side effects call sooner and we can try a different medication.  Call us if you have trouble filling the prescription or if it's not covered by your insurance.  For treatment of recurrent urinary tract infections, we discussed management of recurrent UTIs including prophylaxis with a daily low dose antibiotic, transvaginal estrogen therapy, D-mannose, and cranberry supplements.  We discussed the role of diagnostic testing such as cystoscopy and upper tract imaging.      For vaginal atrophy (thinning of the vaginal tissue that can cause dryness and burning) and UTI prevention we discussed estrogen replacement in the form of vaginal cream.   Start vaginal estrogen therapy nightly for two weeks then 2 times weekly at night. This can be placed with your finger or an applicator inside the vagina and around the urethra.  Please let us know if the prescription is too expensive and we can look for alternative options.   Is vaginal estrogen therapy safe for me? Vaginal estrogen preparations act on the vaginal skin, and only a very tiny amount is absorbed into the bloodstream (0.01%).  They work in a similar way to hand or face  cream.  There is minimal absorption and they are therefore perfectly safe. If you have had breast cancer and have persistent troublesome symptoms which aren't settling with vaginal moisturisers and lubricants, local estrogen treatment may be a possibility, but consultation with your oncologist should take place first.   Please call radiology at [phone] to schedule your imaging study today  Constipation: Our goal is to achieve formed bowel movements daily or every-other-day.  You may need to try different combinations of the following options to find what works best for you - everybody's body works differently so feel free to adjust the dosages as needed.  Some options to help maintain bowel health include:  Dietary changes (more leafy greens, vegetables and fruits; less processed foods) Fiber supplementation (Benefiber, FiberCon, Metamucil or Psyllium). Start slow and increase gradually to full dose. Over-the-counter agents such as: stool softeners (Docusate or Colace) and/or laxatives (Miralax, milk of magnesia)  "Power Pudding" is a natural mixture that may help your constipation.  To make blend 1 cup applesauce, 1 cup wheat bran, and 3/4 cup prune juice, refrigerate and then take 1 tablespoon daily with a large glass of water as needed.   Women should try to eat at least 21 to 25 grams of fiber a day, while men should aim for 30 to 38 grams a day. You can add fiber to your diet with food or a fiber supplement such as psyllium (metamucil), benefiber, or fibercon.   Here's a look at how much dietary fiber is found in some common foods. When buying packaged foods, check the Nutrition Facts label for fiber content. It can vary among brands.  Fruits Serving size Total fiber (  grams)*  Raspberries 1 cup 8.0  Pear 1 medium 5.5  Apple, with skin 1 medium 4.5  Banana 1 medium 3.0  Orange 1 medium 3.0  Strawberries 1 cup 3.0   Vegetables Serving size Total fiber (grams)*  Green peas, boiled  1 cup 9.0  Broccoli, boiled 1 cup chopped 5.0  Turnip greens, boiled 1 cup 5.0  Brussels sprouts, boiled 1 cup 4.0  Potato, with skin, baked 1 medium 4.0  Sweet corn, boiled 1 cup 3.5  Cauliflower, raw 1 cup chopped 2.0  Carrot, raw 1 medium 1.5   Grains Serving size Total fiber (grams)*  Spaghetti, whole-wheat, cooked 1 cup 6.0  Barley, pearled, cooked 1 cup 6.0  Bran flakes 3/4 cup 5.5  Quinoa, cooked 1 cup 5.0  Oat bran muffin 1 medium 5.0  Oatmeal, instant, cooked 1 cup 5.0  Popcorn, air-popped 3 cups 3.5  Brown rice, cooked 1 cup 3.5  Bread, whole-wheat 1 slice 2.0  Bread, rye 1 slice 2.0   Legumes, nuts and seeds Serving size Total fiber (grams)*  Split peas, boiled 1 cup 16.0  Lentils, boiled 1 cup 15.5  Black beans, boiled 1 cup 15.0  Baked beans, canned 1 cup 10.0  Chia seeds 1 ounce 10.0  Almonds 1 ounce (23 nuts) 3.5  Pistachios 1 ounce (49 nuts) 3.0  Sunflower kernels 1 ounce 3.0  *Rounded to nearest 0.5 gram. Source: [employer] for Standard Reference, Legacy Release    Please call [phone] to schedule the earliest appointment for pelvic floor PT.

## 2023-11-03 ENCOUNTER — Ambulatory Visit (HOSPITAL_BASED_OUTPATIENT_CLINIC_OR_DEPARTMENT_OTHER): Payer: Medicare Other | Admitting: Physical Therapy

## 2023-11-03 ENCOUNTER — Other Ambulatory Visit

## 2023-11-03 DIAGNOSIS — M6281 Muscle weakness (generalized): Secondary | ICD-10-CM | POA: Diagnosis not present

## 2023-11-03 DIAGNOSIS — R252 Cramp and spasm: Secondary | ICD-10-CM | POA: Diagnosis not present

## 2023-11-03 DIAGNOSIS — R262 Difficulty in walking, not elsewhere classified: Secondary | ICD-10-CM

## 2023-11-03 DIAGNOSIS — M5459 Other low back pain: Secondary | ICD-10-CM | POA: Diagnosis not present

## 2023-11-03 NOTE — Therapy (Signed)
 OUTPATIENT PHYSICAL THERAPY THORACOLUMBAR Treatment/Progress note    Patient Name: Dawn [name] MRN: [id number] DOB:1945/07/28, 78 y.o., female Today's Date: 11/03/2023  END OF SESSION:               Past Medical History:  Diagnosis Date   Allergy    CAD (coronary artery disease)    s/p DES to RCA in 06/2021   Dry eyes    Endometrial polyp    Essential hypertension 08/25/2018   GERD (gastroesophageal reflux disease)    History of hiatal hernia    History of kidney stones    Hot flashes, menopausal 10/13/2011   Estradiol started    Hyperlipidemia    Jaundice as teenager   no problems since   Memory loss 09/21/2019     1/2 of feet numb all the time   Multiple sclerosis (HCC) dx 2001   Neuropathy    bilateral feet   Osteoarthritis    Sjogren's syndrome (HCC) dx oct 2021   sore muscvles, dry mouth and eyes   Stroke (HCC)    Vertigo    Vision abnormalities    Past Surgical History:  Procedure Laterality Date   ABDOMINAL HYSTERECTOMY  2002   partial   COLONOSCOPY  01/27/2022   2 day prep   colonscopy  2011   CORONARY STENT INTERVENTION N/A 07/18/2021   Procedure: CORONARY STENT INTERVENTION;  Surgeon: Corky Crafts, MD;  Location: [facility];  Service: Cardiovascular;  Laterality: N/A;   DILATATION & CURETTAGE/HYSTEROSCOPY WITH MYOSURE N/A 06/08/2020   Procedure: DILATATION & CURETTAGE/HYSTEROSCOPY/Polypectomy WITH MYOSURE;  Surgeon: Toy Baker, DO;  Location: [facility];  Service: Gynecology;  Laterality: N/A;   LEFT HEART CATH AND CORONARY ANGIOGRAPHY N/A 07/18/2021   Procedure: LEFT HEART CATH AND CORONARY ANGIOGRAPHY;  Surgeon: Corky Crafts, MD;  Location: [facility];  Service: Cardiovascular;  Laterality: N/A;   LUMBAR FUSION  2001   TOTAL HIP ARTHROPLASTY Right 01/17/2021   Procedure: TOTAL HIP ARTHROPLASTY ANTERIOR APPROACH;  Surgeon: Samson Frederic, MD;  Location: [facility];  Service:  Orthopedics;  Laterality: Right;   UPPER GI ENDOSCOPY  yrs ago   Patient Active Problem List   Diagnosis Date Noted   History of gross hematuria 11/02/2023   Constipation 11/02/2023   Chronic diastolic congestive heart failure (HCC) 11/02/2023   UTI (urinary tract infection) 07/09/2023   OAB (overactive bladder) 07/03/2023   Statin myopathy 06/26/2023   History of CVA (cerebrovascular accident) 06/26/2023   Rheumatoid arthritis involving right hand, unspecified whether rheumatoid factor present (HCC) 05/22/2023   Atherosclerotic heart disease of native coronary artery with other forms of angina pectoris (HCC) 05/22/2023   Positive colorectal cancer screening using Cologuard test 11/14/2021   Coronary artery disease    Statin intolerance 04/19/2021   Osteoarthritis of right hip 01/17/2021   Status post THR (total hip replacement) 01/17/2021   Chronic bilateral low back pain without sciatica 10/02/2020   Chronic pain syndrome 06/20/2020   Post laminectomy syndrome 06/20/2020   Sjogren's disease (HCC) 05/23/2020   Primary osteoarthritis of both hands 05/23/2020   Primary osteoarthritis of both feet 05/23/2020   Sjogren's syndrome (HCC) 05/23/2020   Other secondary scoliosis, lumbar region 12/22/2019   Spondylosis without myelopathy or radiculopathy, lumbar region 12/22/2019   Cervical radiculopathy 10/18/2019   Numbness 09/21/2019   Bilateral carpal tunnel syndrome 09/21/2019   High risk medication use 09/21/2019   Memory loss 09/21/2019   Essential hypertension 08/25/2018  Abnormal SPEP 02/19/2018   Hand pain 02/20/2017   Multiple joint pain 02/18/2017   Disturbed cognition 06/25/2016   Impaired cognition 06/25/2016   Sciatica, right side 10/30/2015   Chronic fatigue 08/04/2014   Gait disturbance 08/04/2014   Unspecified visual disturbance 01/31/2013   Transient vision disturbance 01/31/2013   Colon polyps 11/03/2011   POSTHERPETIC NEURALGIA 10/03/2009   DISPLCMT LUMBAR  INTERVERT DISC W/O MYELOPATHY 09/05/2009   RENAL CALCULUS, RECURRENT 09/04/2009   Hyperlipidemia 08/31/2009   Multiple sclerosis (HCC) 08/31/2009   ALLERGIC RHINITIS 08/31/2009  Progress Note Reporting Period 07/23/2023 to 10/02/2023  See note below for Objective Data and Assessment of Progress/Goals.     PCP: Jenetta Misty NP  REFERRING PROVIDER: Dr Christophe Cram   REFERRING DIAG: Low Back Pain   Rationale for Evaluation and Treatment: Rehabilitation  THERAPY DIAG:  No diagnosis found.  ONSET DATE: About 11 days ago her nerve abiliation wore off.   SUBJECTIVE:                                                                                                                                                                                           SUBJECTIVE STATEMENT: The patient reports her hips are sore today. She continues to have syncopal issues but feels  they are improving.  PERTINENT HISTORY:  Right Hip replacement 2022, Low back Pain, MS, TIA October 2023; MI, November 2022; CAD, Sjorgens syndrome  PAIN:  Are you having pain? Yes: NPRS scale: 6/10 Pain location left side lumbar spine Pain description: aching  Aggravating factors: standing and walking  Relieving factors: rest    PRECAUTIONS: None  WEIGHT BEARING RESTRICTIONS: No  FALLS:  Has patient fallen in last 6 months? 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   LUMBAR ROM:   AROM eval 5/15 10/3  Flexion Limited 50 % with pain  Mild improvement but still about 50%  Improved pain but still limited  Extension No limit  Right lateral flexion     Left lateral flexion     Right rotation Limited 50% with pain     Left rotation Limited 50% with pain      (Blank rows = not tested)  LOWER EXTREMITY ROM:     Passive  Right eval Left eval Right  Right  7/24 Right 1/2  Hip flexion 105 90 105 110 with pain at end range  100 with pain   Hip extension   10 degrees from neutral before manual today. Too neutral after stretching     Hip abduction       Hip adduction       Hip internal rotation painful Painful  Painful  Full with less pain  Painful   Hip external rotation   20 degrees with pain  45 with pain  48 with pain   Knee flexion       Knee extension       Ankle dorsiflexion       Ankle plantarflexion       Ankle inversion       Ankle eversion        (Blank rows = not tested)  LOWER EXTREMITY MMT:    MMT Right eval Left eval Right 7/24 Left 7/24 Right  10/2 Left 10/2 right 1/2 left 1/2 Right  Left   Hip flexion 13.1 12.3 11.3 19.1 15.3 23.4 16.7 23.4 17.5 20.4  Hip extension            Hip abduction 11.5 10.1 12.6 17.8 23.8 28.5 25.6 27.9 26.6 20.9  Hip adduction            Hip internal rotation            Hip external rotation            Knee flexion            Knee extension 14.0 13.3 19.4 18.3 23.8 23.6 22.6 24.8 28.8 20.7  Ankle dorsiflexion            Ankle plantarflexion            Ankle inversion            Ankle eversion             (Blank rows = not tested)  FUNCTIONAL TESTS:  5 times sit to stand: 28 sec  5/15 5 times sit to stand test 20 seconds 7/25 24 sec  10/16 17 sec 1/2 17 seconds 3/14 18 sec   3-minute walk test 150 feet without requirement of seated rest break  5/15  7/25 3 min walk test 200'   10/16 3 min walk test 379'   3/14   GAIT:  TODAY'S TREATMENT:                                                                                                                              DATE:  4/15 Manual:  PROM p Roller to anterior hip and lateral hip  There-ex:  Nu-step warm up lvl 3 Seated hip adductor ball squeezes 3x10 Seated LAQ with red RTB 3x10  Neuro-Re-ed   90/90 ball press 3x15  90/90 double knee to chest 3x15  Supine march 3x10  Hip abduction 3x12 red     4/9 Manual: PROM performed with distraction to reduce pain and improve movement Seated glute stretch There-ex: Nu-step warm up lvl 3 Seated hip adductor ball squeezes 3x10 Seated LAQ with red RTB 3x10  Neuro-Re-ed  Step onto and off air-ex x20  Narrow base 3x30 sec hold  Standing marching 3x10   3/26 Manual: STM to low back paraspinals, lateral hip paraspinals and into the anterior hip on the left  There-ex: Nu-step 5 min L5  Ball roll out fwd x10  Ball roll out lateral x10 each direction LTR 2x15    Nuro-Re-ed  Ball press x20   At this time the patient began to report syncope. Her B?P was measured. She reports baseline vertigo.      3/14 Nu-step 5 min L5  Ball roll out fwd x10  Ball roll out lateral x10 each direction LTR 2x15    Strength testing  Neuro-re-ed   Ball press x20  Hip adduction with a bal l   Gait: walk test      2/26  There-ex: Nu-step 5 min L5  Ball roll out fwd x10  Ball roll out lateral x10 each direction  [employer] press x20   Neuro-re-ed  Row 3x10 red  Shoulder extnesion 3x10 red   Reviewed set up and rest   Step onto and off air-ex x20  Narrow base 3x30 sec hold  Heel/toe x20    PATIENT EDUCATION:  Education details: reviewed HEP, symptom management  Person educated: Patient Education method: Explanation, Demonstration, Tactile cues,  Verbal cues, and Handouts Education comprehension: verbalized understanding, returned demonstration, verbal cues required, tactile cues required, and needs further education  HOME EXERCISE PROGRAM: Has previous HEP. Will review next visit  ASSESSMENT:  CLINICAL IMPRESSION: The patient had pain in her bilateral hips today. Therapy performed manual therapy to both hips using a roller. She had tenderness to palpation in her anterior and lateral hips. We worked on core and hip exercises from a decompressed position. We also worked on gross hip strengthening. We will continue to progress as tolerated.    OBJECTIVE IMPAIRMENTS: Abnormal gait, decreased activity tolerance, decreased balance, decreased endurance, decreased knowledge of use of DME, decreased mobility, difficulty walking, decreased ROM, decreased strength, increased muscle spasms, and pain.   ACTIVITY LIMITATIONS: carrying, lifting, bending, standing, squatting, sleeping, stairs, transfers, and locomotion level  PARTICIPATION LIMITATIONS: meal prep, cleaning, laundry, driving, shopping, community activity, and yard work  PERSONAL FACTORS: Right Hip replacement 2022, Low back Pain, MS, TIA October 2023; MI, November 2022; CAD, Sjorgens syndrome are also affecting patient's functional outcome. MS  REHAB POTENTIAL: Good  CLINICAL DECISION MAKING: Evolving/moderate complexity declining mobility   EVALUATION COMPLEXITY: Moderate   GOALS: Goals reviewed with patient? No  SHORT TERM GOALS: Target date: 09/29/2022    Patient will increase gross bilateral lower extremity strength by 5 pounds Baseline: Goal status: has returned to baseline from a few progress notes ago 10/16  2.  Patient will increase lumbar flexion by 10 degrees Baseline:  Goal status:  mild improvement but still painful 10/16  3.  Patient will be independent with basic after exercise  program and stretching program Baseline:  Goal status: has basic HEP  achieved 10/16   LONG TERM GOALS: Target date: 09/29/2022     Patient will stand for greater than 30 minutes without increased pain Baseline:  Goal status: improving 5/15  continued improvement 10/16   2.  Patient will pick item up off the floor without increased low back pain Baseline:  Goal status:still having pain 5/15 improved but still painful 10/16   3.  Patient will ambulate community distances without report of increased low back pain Baseline:  Goal status:limited by pain but maintaining 7/25  4.  Patient will report improved ability to sleep through the night secondary to back pain Baseline:  Goal status: continues to have pain at night 7/25    PLAN:  PT FREQUENCY: 1x/week  PT DURATION: 10 weeks   PLANNED INTERVENTIONS: Therapeutic exercises, Therapeutic activity, Neuromuscular re-education, Balance training, Gait training, Patient/Family education, Self Care, Joint mobilization, Stair training, DME instructions, Aquatic Therapy, Dry Needling, Spinal mobilization, Cryotherapy, Moist heat, Ionotophoresis 4mg /ml Dexamethasone, and Manual therapy.  PLAN FOR NEXT SESSION: Consider manual therapy to the back and hips, consider trigger point dry needling to back and hips.  Reviewed basic HEP.  Begin active strengthening of hips and core.  Begin gait training and functional mobility training.       Kitty Perkins, PT 10/28/2022, 10:42 AM   I have reviewed and concur with this student's documentation.   Kitty Perkins, PT 11/03/2023 10:42 AM   During this treatment session, the therapist was present, participating in and directing the treatment.

## 2023-11-04 ENCOUNTER — Other Ambulatory Visit

## 2023-11-08 NOTE — Progress Notes (Unsigned)
 Provider: Christean Courts FNP-C   Deshane Cotroneo, Elijio Guadeloupe, NP  Patient Care Team: Darika Ildefonso, Elijio Guadeloupe, NP as PCP - General (Family Medicine) Bridgette Campus, MD as PCP - Cardiology (Cardiology) Shirlee Dotter, MD (Inactive) as Consulting Physician (Orthopedic Surgery) [name], Sherida Dimmer, MD (Neurology) Simuel Ducking, [place], OD (Optometry) Morna Arabian, NP as Nurse Practitioner (Obstetrics and Gynecology) Devon Fogo, MD (Inactive) as Consulting Physician (Dermatology) Christophe Cram, MD as Referring Physician (Physical Medicine and Rehabilitation)  Extended Emergency Contact Information Primary Emergency Contact: [facility] Phone: [phone] Mobile Phone: [phone] Relation: Friend Secondary Emergency Contact: Cranford,Jane Mobile Phone: [phone] Relation: Friend  Code Status:  Full Code  Goals of care: Advanced Directive information    11/02/2023   10:32 AM  Advanced Directives  Does Patient Have a Medical Advance Directive? Yes  Type of Estate agent of [place];Living will;Out of facility DNR (pink MOST or yellow form)  Does patient want to make changes to medical advance directive? No - Patient declined  Copy of Healthcare Power of Attorney in Chart? Yes - validated most recent copy scanned in chart (See row information)     Chief Complaint  Patient presents with   Medical Management of Chronic Issues    6 month follow up.    Discussed the use of AI scribe software for clinical note transcription with the patient, who gave verbal consent to proceed.  History of Present Illness   Dawn [name] is a 78 year old female with multiple sclerosis and hypertension who presents for a six-month follow-up visit.  Her blood pressure has been stable, with a recent reading of 138/80 mmHg. Home readings and those at the urologist's office have been lower. She experiences vertigo, which she attributes to sinus issues, but denies recent dizziness  or shortness of breath.  She recently visited a urologist for bladder issues and is awaiting upcoming tests. She has been prescribed Gemtesa  and a vaginal cream, though she is uncertain about insurance coverage for the cream and has not picked it up due to cost concerns.  Her current medications include Allegra, aspirin , lidocaine  patches, vitamin D , Tylenol  as needed, Flonase, omega-3 fatty acids, flaxseed, clonazepam  for leg spasms, carvedilol , tizanidine as needed, hydralazine , losartan , Zetia , and Protonix . She has not used nitroglycerin  or Voltaren  gel recently. She reports an allergy to statins, which caused muscle cramps.  She has a history of high LDL cholesterol, which has improved from 133 mg/dL to 413 mg/dL since January, though it was previously as low as 70 mg/dL. She follows a careful diet, avoiding salt, cheese, and butter, and primarily consumes fish and vegetables. She uses a margarine called Stenal and drinks soy milk due to lactose intolerance.  She has a history of rheumatoid arthritis, which has progressed from affecting her right hand to being more widespread. She experiences significant pain and receives injections from an orthopedic specialist, though they do not last long. She has not seen a rheumatologist recently but recalls a past visit where B vitamins were suggested.  She experiences vertigo, which she attributes to her multiple sclerosis and notes it flares up during allergy season. She manages her allergies with Flonase and a neti pot, which she uses twice daily. She also reports improved digestion with the use of probiotics.  She has a history of congestive heart failure but has not taken fluid pills since a hospital stay. No recent leg swelling and she maintains a low-salt diet. She is currently undergoing physical therapy once a week and hopes  to resume swimming once her urinary tract infections are resolved.   Past Medical History:  Diagnosis Date   Allergy     CAD (coronary artery disease)    s/p DES to RCA in 06/2021   Dry eyes    Endometrial polyp    Essential hypertension 08/25/2018   GERD (gastroesophageal reflux disease)    History of hiatal hernia    History of kidney stones    Hot flashes, menopausal 10/13/2011   Estradiol  started    Hyperlipidemia    Jaundice as teenager   no problems since   Memory loss 09/21/2019     1/2 of feet numb all the time   Multiple sclerosis (HCC) dx 2001   Neuropathy    bilateral feet   Osteoarthritis    Sjogren's syndrome (HCC) dx oct 2021   sore muscvles, dry mouth and eyes   Stroke (HCC)    Vertigo    Vision abnormalities    Past Surgical History:  Procedure Laterality Date   ABDOMINAL HYSTERECTOMY  2002   partial   COLONOSCOPY  01/27/2022   2 day prep   colonscopy  2011   CORONARY STENT INTERVENTION N/A 07/18/2021   Procedure: CORONARY STENT INTERVENTION;  Surgeon: Lucendia Rusk, MD;  Location: [facility];  Service: Cardiovascular;  Laterality: N/A;   DILATATION & CURETTAGE/HYSTEROSCOPY WITH MYOSURE N/A 06/08/2020   Procedure: DILATATION & CURETTAGE/HYSTEROSCOPY/Polypectomy WITH MYOSURE;  Surgeon: Olin Bertin, DO;  Location: [facility];  Service: Gynecology;  Laterality: N/A;   LEFT HEART CATH AND CORONARY ANGIOGRAPHY N/A 07/18/2021   Procedure: LEFT HEART CATH AND CORONARY ANGIOGRAPHY;  Surgeon: Lucendia Rusk, MD;  Location: [facility];  Service: Cardiovascular;  Laterality: N/A;   LUMBAR FUSION  2001   TOTAL HIP ARTHROPLASTY Right 01/17/2021   Procedure: TOTAL HIP ARTHROPLASTY ANTERIOR APPROACH;  Surgeon: Adonica Hoose, MD;  Location: [facility];  Service: Orthopedics;  Laterality: Right;   UPPER GI ENDOSCOPY  yrs ago    Allergies  Allergen Reactions   Repatha  [Evolocumab ] Itching   Crestor  [Rosuvastatin ]     MYALGIA    Pravastatin     Statins Other (See Comments)    Muscle pain; Tolerates Crestor      Allergies as of 11/02/2023        Reactions   Repatha  [evolocumab ] Itching   Crestor  [rosuvastatin ]    MYALGIA    Pravastatin     Statins Other (See Comments)   Muscle pain; Tolerates Crestor          Medication List        Accurate as of November 02, 2023 11:59 PM. If you have any questions, ask your nurse or doctor.          STOP taking these medications    amoxicillin  500 MG tablet Commonly known as: AMOXIL  Stopped by: Darlene Ehlers   ciprofloxacin  500 MG tablet Commonly known as: CIPRO  Stopped by: Darlene Ehlers   CoQ-10 100 MG Caps Stopped by: Darlene Ehlers   diclofenac  Sodium 1 % Gel Commonly known as: Voltaren  Stopped by: Daiwik Buffalo C Panfilo Ketchum   DULoxetine  30 MG capsule Commonly known as: CYMBALTA  Stopped by: Darlene Ehlers   famotidine  20 MG tablet Commonly known as: PEPCID  Stopped by: Darlene Ehlers   solifenacin  5 MG tablet Commonly known as: VESICARE  Stopped by: Darlene Ehlers       TAKE these medications    acetaminophen  500 MG tablet Commonly known as: TYLENOL   Take 1,000 mg by mouth every 6 (six) hours as needed for mild pain or moderate pain.   aspirin  EC 81 MG tablet Take 81 mg by mouth daily. Swallow whole.   carvedilol  6.25 MG tablet Commonly known as: COREG  Take 1 tablet (6.25 mg total) by mouth 2 (two) times daily with a meal.   cholecalciferol 25 MCG (1000 UNIT) tablet Commonly known as: VITAMIN D3 Take 2,000 Units by mouth daily.   clonazePAM  0.5 MG tablet Commonly known as: KLONOPIN  Take 1 tablet (0.5 mg total) by mouth at bedtime.   estradiol  0.1 MG/GM vaginal cream Commonly known as: ESTRACE  Place 0.5-1g nightly for two weeks then twice a week after Started by: Darlene Ehlers   ezetimibe  10 MG tablet Commonly known as: Zetia  Take 1 tablet (10 mg total) by mouth daily.   fexofenadine 180 MG tablet Commonly known as: ALLEGRA Take 180 mg by mouth at bedtime.   FLAXSEED OIL PO Take 5-10 mLs by mouth 3 (three) times a week. Power   fluticasone 50 MCG/ACT nasal  spray Commonly known as: FLONASE Place 1 spray into both nostrils daily as needed for allergies or rhinitis.   Gemtesa  75 MG Tabs Generic drug: Vibegron  Take 1 tablet (75 mg total) by mouth daily. What changed: Another medication with the same name was added. Make sure you understand how and when to take each. Changed by: Darlene Ehlers   Gemtesa  75 MG Tabs Generic drug: Vibegron  Take 1 tablet (75 mg total) by mouth daily. What changed: You were already taking a medication with the same name, and this prescription was added. Make sure you understand how and when to take each. Changed by: Darlene Ehlers   hydrALAZINE  25 MG tablet Commonly known as: APRESOLINE  Take 1 tablet (25 mg total) by mouth in the morning and at bedtime.   lidocaine  4 % Place 1 patch onto the skin daily as needed (mild pain). Remove & Discard patch within 12 hours or as directed by MD   losartan  100 MG tablet Commonly known as: COZAAR  Take 1 tablet (100 mg total) by mouth daily. What changed: See the new instructions. Changed by: Jourdin Gens C Kahmari Koller   melatonin 5 MG Tabs Take 5 mg by mouth at bedtime as needed (Sleep).   modafinil  200 MG tablet Commonly known as: PROVIGIL  One po qAM and one po qNoon   nitroGLYCERIN  0.4 MG SL tablet Commonly known as: NITROSTAT  Place 1 tablet (0.4 mg total) under the tongue every 5 (five) minutes as needed for chest pain.   OMEGA 3 500 PO Take 500 mg by mouth daily.   pantoprazole  40 MG tablet Commonly known as: PROTONIX  TAKE 1 TABLET(40 MG) BY MOUTH TWICE DAILY   tiZANidine 4 MG tablet Commonly known as: ZANAFLEX Take 2 mg by mouth 3 (three) times daily as needed.        Review of Systems  Constitutional:  Negative for appetite change, chills, fatigue, fever and unexpected weight change.  HENT:  Negative for congestion, ear discharge, ear pain, facial swelling, hearing loss, nosebleeds, postnasal drip, rhinorrhea, sinus pressure, sinus pain, sneezing, sore throat,  tinnitus and trouble swallowing.   Eyes:  Negative for pain, discharge, redness, itching and visual disturbance.  Respiratory:  Negative for cough, chest tightness, shortness of breath and wheezing.   Cardiovascular:  Negative for chest pain, palpitations and leg swelling.  Gastrointestinal:  Negative for abdominal distention, abdominal pain, blood in stool, constipation, diarrhea, nausea and vomiting.  Endocrine: Negative  for cold intolerance, heat intolerance, polydipsia, polyphagia and polyuria.  Genitourinary:  Negative for difficulty urinating, dysuria, flank pain, frequency and urgency.  Musculoskeletal:  Positive for arthralgias. Negative for back pain, gait problem, joint swelling, myalgias, neck pain and neck stiffness.  Skin:  Negative for color change, pallor, rash and wound.  Neurological:  Negative for dizziness, syncope, speech difficulty, weakness, light-headedness, numbness and headaches.       Vertigo   Hematological:  Does not bruise/bleed easily.  Psychiatric/Behavioral:  Negative for agitation, behavioral problems, confusion, hallucinations, self-injury, sleep disturbance and suicidal ideas. The patient is not nervous/anxious.     Immunization History  Administered Date(s) Administered   Fluad Quad(high Dose 65+) 04/01/2019, 04/19/2021   Influenza Whole 05/14/2011   Influenza, High Dose Seasonal PF 04/30/2017, 05/15/2018, 05/18/2020   Influenza-Unspecified 03/04/2022   PFIZER(Purple Top)SARS-COV-2 Vaccination 08/27/2019, 09/21/2019, 03/07/2020, 11/16/2020   Pfizer Covid-19 Vaccine Bivalent Booster 35yrs & up 03/04/2022   Pfizer(Comirnaty)Fall Seasonal Vaccine 12 years and older 04/04/2023   Pneumococcal Conjugate-13 08/30/2018   Pneumococcal Polysaccharide-23 03/21/2009, 04/20/2020   Td (Adult), 2 Lf Tetanus Toxid, Preservative Free 12/04/2009   Tdap 11/19/2012   Zoster Recombinant(Shingrix) 04/04/2023, 08/18/2023   Zoster, Live 11/29/2009   Pertinent  Health  Maintenance Due  Topic Date Due   DEXA SCAN  11/01/2024 (Originally 12/20/2012)   INFLUENZA VACCINE  02/19/2024      03/26/2023    1:53 PM 05/22/2023   10:04 AM 07/03/2023    1:17 PM 09/28/2023   10:25 AM 11/02/2023   10:31 AM  Fall Risk  Falls in the past year? 0 1 1 0 0  Was there an injury with Fall?  0 1 0 0  Fall Risk Category Calculator  2 2 0 0  Patient at Risk for Falls Due to  History of fall(s) No Fall Risks No Fall Risks No Fall Risks  Fall risk Follow up   Falls evaluation completed Falls evaluation completed;Education provided;Falls prevention discussed Falls evaluation completed   Functional Status Survey:    Vitals:   11/02/23 1036  BP: 138/80  Pulse: 66  Resp: 18  Temp: 97.8 F (36.6 C)  SpO2: 97%  Weight: 189 lb 3.2 oz (85.8 kg)  Height: 5\' 2"  (1.575 m)   Body mass index is 34.61 kg/m. Physical Exam VITALS: T- 97.8, P- 66, BP- 138/80, SaO2- 97% MEASUREMENTS: Weight- 189.2. GENERAL: Alert, cooperative, well developed, no acute distress. HEENT: Normocephalic, normal oropharynx, moist mucous membranes. Right ear normal, left ear with cerumen buildup. Nasal mucosa not swollen. Mild tenderness on left maxillary sinus. NECK: Neck supple, no pain on swallowing. CHEST: Clear to auscultation bilaterally. No wheezes, rhonchi, or crackles. CARDIOVASCULAR: Normal heart rate and rhythm, S1 and S2 normal without murmurs. ABDOMEN: Soft, non-tender, non-distended, without organomegaly. Normal bowel sounds. Tenderness on left side of abdomen. EXTREMITIES: No cyanosis or edema. No swelling in legs. NEUROLOGICAL: Cranial nerves grossly intact, moves all extremities without gross motor or sensory deficit.   Labs reviewed: No results for input(s): "NA", "K", "CL", "CO2", "GLUCOSE", "BUN", "CREATININE", "CALCIUM ", "MG", "PHOS" in the last 8760 hours. Recent Labs    04/15/23 1029 08/19/23 1002  AST 15 19  ALT 14 16  ALKPHOS 115 129*  BILITOT 0.3 0.4  PROT 6.1 6.0  ALBUMIN  4.3 3.9   Recent Labs    05/22/23 1059  WBC 5.5  NEUTROABS 3,982  HGB 13.9  HCT 42.5  MCV 93.0  PLT 248   Lab Results  Component Value Date  TSH 2.85 05/22/2023   Lab Results  Component Value Date   HGBA1C 5.4 06/15/2021   Lab Results  Component Value Date   CHOL 181 08/19/2023   HDL 49 08/19/2023   LDLCALC 110 (H) 08/19/2023   LDLDIRECT 130.2 11/03/2011   TRIG 123 08/19/2023   CHOLHDL 3.7 08/19/2023    Significant Diagnostic Results in last 30 days:  No results found.  Assessment/Plan    OAB She is under the care of a urologist for bladder dysfunction. The urologist plans to conduct a cystoscopy and MRI, and has prescribed Gemtesa  and a vaginal cream. She is uncertain about insurance coverage for the cream. The urologist's expertise in MS is reassuring for her.   - Continue Gemtesa  as prescribed by the urologist   - Await results of urologist's tests   - Monitor insurance coverage for vaginal cream    Multiple Sclerosis (MS)   She experiences vertigo related to MS, which exacerbates during allergy season. Allergy management with Flonase and neti pot alleviates symptoms.   - Continue Flonase and neti pot for allergy management    Rheumatoid Arthritis   She experiences widespread joint pain and receives orthopedic injections for pain management, though they are not long-lasting. She is considering revisiting a rheumatologist for further management.   - Consider referral to rheumatologist if symptoms worsen    Hypertension   Blood pressure is well-controlled at 138/80 mmHg with carvedilol , hydralazine , and losartan . She reports no dizziness or other symptoms related to blood pressure.   - Refill losartan  prescription and send to [facility]    Hyperlipidemia   LDL cholesterol improved from 133 mg/dL to 829 mg/dL but was previously as low as 70 mg/dL. She is on Zetia  and is allergic to statins, experiencing severe muscle cramps. Dietary habits focus on fish and  vegetables. Consider cardiology referral for alternative treatments if LDL levels increase.   - Continue Zetia    - Monitor LDL levels   - Consider referral to cardiologist if LDL levels increase    General Health Maintenance   She takes omega-3 fatty acids, flaxseed, and probiotics, which benefit overall health. Probiotics have improved digestion and bloating. She is considering a tetanus shot, pending insurance coverage.   - Continue omega-3 fatty acids, flaxseed, and probiotics   - Obtain tetanus shot at pharmacy if covered by insurance    Follow-up   She is scheduled for tests with the urologist and plans a six-month follow-up for a routine check-up. Lab work is needed for thyroid , chemistry, anemia, and infection.   - Schedule six-month follow-up appointment   - Complete lab work for thyroid , chemistry, anemia, and infection   Family/ staff Communication: Reviewed plan of care with patient verbalized understanding   Labs/tests ordered:  - CBC with Differential/Platelet - CMP with eGFR([facility]) - TSH  Next Appointment : Return in about 6 months (around 05/03/2024) for medical mangement of chronic issues.Aaron Aas   Spent 33 minutes of Face to face and non-face to face with patient  >50% time spent counseling; reviewing medical record; tests; labs; documentation and developing future plan of care.   Estil Heman, NP

## 2023-11-09 ENCOUNTER — Encounter: Payer: Self-pay | Admitting: Neurology

## 2023-11-09 ENCOUNTER — Ambulatory Visit (INDEPENDENT_AMBULATORY_CARE_PROVIDER_SITE_OTHER): Payer: Medicare Other | Admitting: Neurology

## 2023-11-09 VITALS — BP 160/83 | HR 78 | Ht 62.0 in | Wt 185.5 lb

## 2023-11-09 DIAGNOSIS — H539 Unspecified visual disturbance: Secondary | ICD-10-CM | POA: Diagnosis not present

## 2023-11-09 DIAGNOSIS — M2550 Pain in unspecified joint: Secondary | ICD-10-CM

## 2023-11-09 DIAGNOSIS — R269 Unspecified abnormalities of gait and mobility: Secondary | ICD-10-CM

## 2023-11-09 DIAGNOSIS — R42 Dizziness and giddiness: Secondary | ICD-10-CM | POA: Diagnosis not present

## 2023-11-09 DIAGNOSIS — M3509 Sicca syndrome with other organ involvement: Secondary | ICD-10-CM

## 2023-11-09 DIAGNOSIS — I639 Cerebral infarction, unspecified: Secondary | ICD-10-CM | POA: Diagnosis not present

## 2023-11-09 DIAGNOSIS — M4306 Spondylolysis, lumbar region: Secondary | ICD-10-CM

## 2023-11-09 DIAGNOSIS — Z79899 Other long term (current) drug therapy: Secondary | ICD-10-CM

## 2023-11-09 DIAGNOSIS — G35 Multiple sclerosis: Secondary | ICD-10-CM | POA: Diagnosis not present

## 2023-11-09 MED ORDER — MODAFINIL 200 MG PO TABS: ORAL_TABLET | ORAL | 1 refills | Status: DC

## 2023-11-09 MED ORDER — CLONAZEPAM 0.5 MG PO TABS: 0.5000 mg | ORAL_TABLET | Freq: Every day | ORAL | 1 refills | Status: DC

## 2023-11-09 MED ORDER — METHYLPREDNISOLONE 4 MG PO TABS: ORAL_TABLET | ORAL | 0 refills | Status: DC

## 2023-11-09 MED ORDER — BACLOFEN 10 MG PO TABS
ORAL_TABLET | ORAL | 3 refills | Status: DC
Start: 2023-11-09 — End: 2024-03-16

## 2023-11-09 NOTE — Progress Notes (Signed)
 [facility]  PATIENT: Dawn [name] DOB: August 21, 1945  REFERRING CLINICIAN: Ruven Coy HISTORY FROM: Patient  REASON FOR VISIT: MS   HISTORICAL  CHIEF COMPLAINT:  Chief Complaint  Patient presents with   Follow-up    Pt in room 11. Alone. Here for MS follow up. Pt last eye exam was last year, pt wants to discuss Ocrevus. Pt reports double vision at night, and having muscle pain.    HISTORY OF PRESENT ILLNESS:  Dawn [name] is a 78 y.o. woman who was diagnosed with multiple sclerosis in 2001.      Update 11/09/2023 She felt that her MS was stable until 2 weeks ago.  She is off Leflunomide  for Sjogren's and MS.   She still has dry eyes.    Remains on ASA for CVA.  No new CVA/TIA symptm  She reports new neurologic symptoms.  She has noted more vertigo and diplopia on left gaze over last 2 weeks. She has had in past but not in several years.   She feels more dizzy when she lays on her left side..  The left ear feels stuffed up.    She states she was checked for Meniere's years ago.   She had vertigo at the onset of her MS.      Gait and balance are poor.  She also has pain that affects her gait..  She has now been using a walker predominantly..   She is noting numbness but less pain in her hands.  At night, she gets painful tingling in her legs.   She has [place] urgency with some incontinence.    She has had a couple falls due to lightheadedness       She is having more lower back and hip pain.  Piriformis muscle injections and trochanteric bursa injections have helped her some in the past.     The right leg is more painful than left and sometimes feels it will give way.  She had a nerve block/RFA for her back in 2023    She has had several injections by Dr. Ibazebo.  Initially they helped but less so more recently.    She stopped taking a statin and will be switched to Repatha .  She feels better off the statin.     Because of her cardiac history, she also  saw cardiology.  She had a cardiac stent placed after presenting with chest pain and having a cardiac catheter one plus year ago.   She had been on ASA/Plavix   x 1 year but was on just ASA at the time of the stroke She was already on carvedilol  and dose was increased.  . She also has CHF.  They are changing her Crestor  to Repatha .    She is on several BP med's:  carvedilol , hydralazone, losartan .        She feels her cognition is about the same as last visit.      She notes some memory difficulties and wrd finding issues/.     She denies anxiety or depression at this time.   She has fatigue and takes 1 Provigil  every morning and sometimes another 1/2 pill in the afternoon.     Clonazepam  helps insomnia some and spasticity at night as much as it used to.   It helps her fall asleep but then she wakes up 3 hours later.     Stroke History: She had a left medial parietal lobe stroke in September 2023.  The week  before, she noted difficulty with reading and also had mild word finding difficulty.   Reading has returned to normal but she still notes mild word finding issues - though much better.  We checked a carotid ultrasoud last week 05/21/2022 and it was normal for age.   Many years ago, she had a left PICA cerebellar stroke and we had started ASA _______________________________________________ MS History:    In 1994, she had an episode of severe vertigo with gait ataxia. An MRI at that time was reportedly normal. The symptoms were felt to be due to allergies. About 7 years later she had vertigo, gait ataxia and diplopia. Also her left leg was giving out. Short after that she had a hysterectomy. Postoperatively, she was numb in both legs and she had a lumbar puncture which showed changes consistent with MS. Then, an MRI of the brain was performed showing changes consistent with MS. She was referred to Dr. Dania Dupre who her on Betaseron. Last year, she switched from Betaseron to Tecfidera, in the hope that she would be  able to avoid shots as she was having severe skin reactions with knots.    However, she had a lot of difficulty tolerating Tecfidera and swithced to Plegridy  in early 2016 and to Aubagio  late 2016.  Switched to Leflunomide  for insurance reasons in 2019.    She stopped leflunomide  in 2023.    Imaging: MRI of the lumbar spine 09/01/2022 showed multilevel spondylosis similar to the 05/24/2021 MRI.  There is left lateral recess stenosis and left foraminal narrowing at L2-L3.  There is mild spinal stenosis at L3-L4 and L4-L5.  She has had L5-S1 posterior decompression and has moderate to severe right foraminal narrowing.  MRI brain 05/17/2022 showed a subacute stroke in the deep white matter of the posterior frontal/parietal lobe.  Chronic infarction in the left cerebellum and scattered T2/FLAIR hyperintense foci likely representing a combination of chronic demyelinating plaque and chronic microvascular ischemic change.  These were stable.  MRI brain 11/17/2019 showed Remote left PICA distribution CVA in the left cerebellar hemisphere.  This appears unchanged compared to the 2020 MRI.      Scattered T2/FLAIR hyperintense foci in the hemispheres.  Some foci are juxtacortical and periventricular consistent with multiple sclerosis though other foci are more nonspecific and could also be seen with chronic microvascular ischemic change.  None of the foci appear to be acute.  There are no new lesions compared to the 09/13/2018 MRI.  MRI cervical spine in 2021 showed a normal spinal cord.     At C5-C6 there is a right paramedian disc protrusion causing mild right foraminal narrowing and mild spinal stenosis but no nerve root compression.   Minimal degenerative changes at C2-C3 and C3-C4 detailed above that do not lead to spinal stenosis or nerve root compression.   MRI brain 09/13/2018 shows multiple T2/flair hyperintense foci in the hemispheres.  The pattern is consistent with chronic demyelinating plaque associated  with multiple sclerosis though some foci likely also represent chronic microvascular ischemic change.  None of the foci enhance or appear to be acute and there is no change compared to the 2017 MRI.      Remote left cerebellar infarction.      There is a normal enhancement pattern and no acute findings.  REVIEW OF SYSTEMS:  Constitutional: No fevers, chills, sweats, or change in appetite.   Reports fatigue Eyes: No visual changes, double vision, eye pain Ear, nose and throat: No hearing loss, ear pain, nasal  congestion, sore throat Cardiovascular: No chest pain, palpitations Respiratory:  No shortness of breath at rest or with exertion.   No wheezes GastrointestinaI: No nausea, vomiting, diarrhea, abdominal pain, fecal incontinence Genitourinary:  see above. Musculoskeletal:  Pain in left greater than right hip Integumentary: No rash, pruritus.    Neurological: as above Psychiatric: No depression at this time.  No anxiety Endocrine: No palpitations, diaphoresis, change in appetite, change in weigh or increased thirst Hematologic/Lymphatic:  No anemia, purpura, petechiae. Allergic/Immunologic: No itchy/runny eyes, nasal congestion, recent allergic reactions, rashes  ALLERGIES: Allergies  Allergen Reactions   Repatha  [Evolocumab ] Itching   Crestor  [Rosuvastatin ]     MYALGIA    Pravastatin     Statins Other (See Comments)    Muscle pain; Tolerates Crestor      HOME MEDICATIONS: Outpatient Medications Prior to Visit  Medication Sig Dispense Refill   acetaminophen  (TYLENOL ) 500 MG tablet Take 1,000 mg by mouth every 6 (six) hours as needed for mild pain or moderate pain.     aspirin  EC 81 MG tablet Take 81 mg by mouth daily. Swallow whole.     carvedilol  (COREG ) 6.25 MG tablet Take 1 tablet (6.25 mg total) by mouth 2 (two) times daily with a meal. 180 tablet 3   cholecalciferol (VITAMIN D ) 25 MCG (1000 UNIT) tablet Take 2,000 Units by mouth daily.     estradiol  (ESTRACE ) 0.1 MG/GM  vaginal cream Place 0.5-1g nightly for two weeks then twice a week after 30 g 3   ezetimibe  (ZETIA ) 10 MG tablet Take 1 tablet (10 mg total) by mouth daily. 90 tablet 1   fexofenadine (ALLEGRA) 180 MG tablet Take 180 mg by mouth at bedtime.      Flaxseed, Linseed, (FLAXSEED OIL PO) Take 5-10 mLs by mouth 3 (three) times a week. Power     fluticasone (FLONASE) 50 MCG/ACT nasal spray Place 1 spray into both nostrils daily as needed for allergies or rhinitis.     hydrALAZINE  (APRESOLINE ) 25 MG tablet Take 1 tablet (25 mg total) by mouth in the morning and at bedtime. 180 tablet 3   Lidocaine  4 % PTCH Place 1 patch onto the skin daily as needed (mild pain). Remove & Discard patch within 12 hours or as directed by MD     losartan  (COZAAR ) 100 MG tablet Take 1 tablet (100 mg total) by mouth daily. 90 tablet 1   nitroGLYCERIN  (NITROSTAT ) 0.4 MG SL tablet Place 1 tablet (0.4 mg total) under the tongue every 5 (five) minutes as needed for chest pain. 30 tablet 0   Omega-3 Fatty Acids (OMEGA 3 500 PO) Take 500 mg by mouth daily.     pantoprazole  (PROTONIX ) 40 MG tablet TAKE 1 TABLET(40 MG) BY MOUTH TWICE DAILY 180 tablet 3   tiZANidine (ZANAFLEX) 4 MG tablet Take 2 mg by mouth 3 (three) times daily as needed.     clonazePAM  (KLONOPIN ) 0.5 MG tablet Take 1 tablet (0.5 mg total) by mouth at bedtime. 90 tablet 1   modafinil  (PROVIGIL ) 200 MG tablet One po qAM and one po qNoon 180 tablet 1   melatonin 5 MG TABS Take 5 mg by mouth at bedtime as needed (Sleep). (Patient not taking: Reported on 11/09/2023)     Vibegron  (GEMTESA ) 75 MG TABS Take 1 tablet (75 mg total) by mouth daily. (Patient not taking: Reported on 11/09/2023) 30 tablet 2   Vibegron  (GEMTESA ) 75 MG TABS Take 1 tablet (75 mg total) by mouth daily. (Patient not taking: Reported on  11/09/2023)     No facility-administered medications prior to visit.    PAST MEDICAL HISTORY: Past Medical History:  Diagnosis Date   Allergy    CAD (coronary artery  disease)    s/p DES to RCA in 06/2021   Dry eyes    Endometrial polyp    Essential hypertension 08/25/2018   GERD (gastroesophageal reflux disease)    History of hiatal hernia    History of kidney stones    Hot flashes, menopausal 10/13/2011   Estradiol  started    Hyperlipidemia    Jaundice as teenager   no problems since   Memory loss 09/21/2019     1/2 of feet numb all the time   Multiple sclerosis (HCC) dx 2001   Neuropathy    bilateral feet   Osteoarthritis    Sjogren's syndrome (HCC) dx oct 2021   sore muscvles, dry mouth and eyes   Stroke (HCC)    Vertigo    Vision abnormalities     PAST SURGICAL HISTORY: Past Surgical History:  Procedure Laterality Date   ABDOMINAL HYSTERECTOMY  2002   partial   COLONOSCOPY  01/27/2022   2 day prep   colonscopy  2011   CORONARY STENT INTERVENTION N/A 07/18/2021   Procedure: CORONARY STENT INTERVENTION;  Surgeon: Lucendia Rusk, MD;  Location: [facility];  Service: Cardiovascular;  Laterality: N/A;   DILATATION & CURETTAGE/HYSTEROSCOPY WITH MYOSURE N/A 06/08/2020   Procedure: DILATATION & CURETTAGE/HYSTEROSCOPY/Polypectomy WITH MYOSURE;  Surgeon: Olin Bertin, DO;  Location: [facility];  Service: Gynecology;  Laterality: N/A;   LEFT HEART CATH AND CORONARY ANGIOGRAPHY N/A 07/18/2021   Procedure: LEFT HEART CATH AND CORONARY ANGIOGRAPHY;  Surgeon: Lucendia Rusk, MD;  Location: [facility];  Service: Cardiovascular;  Laterality: N/A;   LUMBAR FUSION  2001   TOTAL HIP ARTHROPLASTY Right 01/17/2021   Procedure: TOTAL HIP ARTHROPLASTY ANTERIOR APPROACH;  Surgeon: Adonica Hoose, MD;  Location: [facility];  Service: Orthopedics;  Laterality: Right;   UPPER GI ENDOSCOPY  yrs ago    FAMILY HISTORY: Family History  Problem Relation Age of Onset   Heart failure Mother    Heart failure Father    Arthritis Other    Hypertension Other    Colon cancer Neg Hx    Colon polyps Neg Hx     Esophageal cancer Neg Hx    Stomach cancer Neg Hx    Rectal cancer Neg Hx    Bladder Cancer Neg Hx    Uterine cancer Neg Hx    Kidney cancer Neg Hx     SOCIAL HISTORY:  Social History   Socioeconomic History   Marital status: Widowed    Spouse name: Not on file   Number of children: Not on file   Years of education: Not on file   Highest education level: Not on file  Occupational History   Occupation: retired  Tobacco Use   Smoking status: Former    Current packs/day: 0.00    Average packs/day: 0.5 packs/day for 15.0 years (7.5 ttl pk-yrs)    Types: Cigarettes    Start date: 08/04/1968    Quit date: 08/05/1983    Years since quitting: 40.2   Smokeless tobacco: Never  Vaping Use   Vaping status: Never Used  Substance and Sexual Activity   Alcohol  use: Yes    Comment: rarely, maybe yearly drink   Drug use: Never   Sexual activity: Not Currently    Birth control/protection:  Post-menopausal  Other Topics Concern   Not on file  Social History Narrative   Regular Exercise-no   Widowed   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     Tobacco use, amount per day now: former   Past tobacco use, amount per day: 1/2-1 packet   How many years did you use tobacco: 12 years   Alcohol  use (drinks per week): 0   Diet: mediterranean based   Do you drink/eat things with caffeine: yes   Marital status:        widow                          What year were you married? 1987   Do you live in a house, apartment, assisted living, condo, trailer, etc.? house   Is it one or more stories? 1 story   How many persons live in your home? 1   Do you have pets in your home?( please list) no   Current or past profession: Therapist, music    Do you exercise?          yes                        Type and how often? Tai chi and pt  therapy, stretches etc....   Do you have a living will? yes   Do you have a DNR form?      no                             If not, do you want to discuss one? yes   Do you have signed POA/HPOA forms?      no                  If so, please bring to you appointment   Social Drivers of Health   Financial Resource Strain: Not on file  Food Insecurity: No Food Insecurity (01/07/2022)   Hunger Vital Sign    Worried About Running Out of Food in the Last Year: Never true    Ran Out of Food in the Last Year: Never true  Transportation Needs: No Transportation Needs (01/07/2022)   PRAPARE - Administrator, Civil Service (Medical): No    Lack of Transportation (Non-Medical): No  Physical Activity: Not on file  Stress: Not on file  Social Connections: Not on file  Intimate Partner Violence: Not At Risk (02/03/2022)   Humiliation, Afraid, Rape, and Kick questionnaire    Fear of Current or Ex-Partner: No    Emotionally Abused: No    Physically Abused: No    Sexually Abused: No     PHYSICAL EXAM  Vitals:   11/09/23 1115 11/09/23 1118  BP: (!) 168/90 (!) 160/83  Pulse: 78   Weight: 185 lb 8 oz (84.1 kg)   Height: 5\' 2"  (1.575 m)     Body mass index is 33.93 kg/m.   General: The patient is well-developed and well-nourished and in no acute distress.   Has cerumen in left ear canal partially blocking view of TM.   Musculoskeletal: She has lumbar tenderness and trochanteric bursa tenderness.  Neurologic Exam  Mental status: The patient is alert and oriented x 3 at the time of the examination.   apparently normal attention span, stm and concentration ability.   Speech is normal now.   .      Cranial nerves: Extraocular movements are full.  Visual fields are normal.  Facial strength and sensory are normal.  Hearing reduced on left .  Weber is midline.    Motor:  Muscle bulk is normal but tone is symmetric, more on the left.. Strength is 4/5 in median innervated and 4+/5 and ulnar  innervated intrinsic hand muscles.      Sensory:   Sensory testing is intact to touch and vibration in the arms.  She has reduced vibration sensation in the feet and ankles.  Coordination: Finger-nose-finger and heel-to-shin is performed better on the right than the left.  Gait and station: Station is normal.  The gait is wide.  She cannot do a tandem walk.   Romberg is borderline  Reflexes: Deep tendon reflexes are symmetric and normal bilaterally.      DIAGNOSTIC DATA (LABS, IMAGING, TESTING) - I reviewed patient records, labs, notes, testing and imaging myself where available.  Lab Results  Component Value Date   WBC 5.5 05/22/2023   HGB 13.9 05/22/2023   HCT 42.5 05/22/2023   MCV 93.0 05/22/2023   PLT 248 05/22/2023      Component Value Date/Time   NA 139 10/22/2022 1018   NA 138 07/12/2021 1031   K 4.6 10/22/2022 1018   CL 105 10/22/2022 1018   CO2 25 10/22/2022 1018   GLUCOSE 93 10/22/2022 1018   BUN 26 (H) 10/22/2022 1018   BUN 16 07/12/2021 1031   CREATININE 0.69 10/22/2022 1018   CALCIUM  9.2 10/22/2022 1018   PROT 6.0 08/19/2023 1002   ALBUMIN 3.9 08/19/2023 1002   AST 19 08/19/2023 1002   AST 20 03/09/2018 1342   ALT 16 08/19/2023 1002   ALT 22 03/09/2018 1342   ALKPHOS 129 (H) 08/19/2023 1002   BILITOT 0.4 08/19/2023 1002   BILITOT 0.4 03/09/2018 1342   GFRNONAA >60 05/14/2022 1720   GFRNONAA 91 01/25/2021 1527   GFRAA 105 01/25/2021 1527   Lab Results  Component Value Date   CHOL 181 08/19/2023   HDL 49 08/19/2023   LDLCALC 110 (H) 08/19/2023   LDLDIRECT 130.2 11/03/2011   TRIG 123 08/19/2023   CHOLHDL 3.7 08/19/2023   Lab Results  Component Value Date   HGBA1C 5.4 06/15/2021   No results found for: "VITAMINB12" Lab Results  Component Value Date   TSH 2.85 05/22/2023       ASSESSMENT AND PLAN  Multiple sclerosis (HCC)  Abnormality of gait  High risk medication use  Cerebrovascular accident (CVA), unspecified mechanism  (HCC)  Lumbar spondylolysis  Sjogren's syndrome with other organ involvement (HCC)  Multiple joint pain  Transient vision disturbance  Vertigo   1.    She will remain off leflunomide  .  New symptoms unclear if relapse vs ear related symptoms.    Advised to use ear wax drops and we will try a 6 day steroid pack   If not getting better, will recheck brain MRI and consider referral to ENT 2.    Detrol  and Gemtesa  or bladder.    She did better with combo and is trying to get authorized.    3.    Continue clonazepam  and modafinil .   Renew   4.    Try to stay active.  Continue PT 5.    Return to see me in about 6 months but is advised to call sooner if she has new or worsening neurologic symptoms.  This visit is part of a comprehensive longitudinal care medical relationship regarding the patients primary diagnosis of MS and related concerns.       Dawn Shane A. Godwin Lat, MD, PhD 11/09/2023, 11:59 AM Certified in Neurology, Clinical Neurophysiology, Sleep Medicine, Pain Medicine and Neuroimaging  [facility] [address] [place], [place] [address] [phone]-' \

## 2023-11-10 ENCOUNTER — Ambulatory Visit (HOSPITAL_BASED_OUTPATIENT_CLINIC_OR_DEPARTMENT_OTHER): Payer: Medicare Other | Admitting: Physical Therapy

## 2023-11-10 ENCOUNTER — Telehealth: Payer: Self-pay | Admitting: Pharmacist

## 2023-11-10 ENCOUNTER — Encounter (HOSPITAL_BASED_OUTPATIENT_CLINIC_OR_DEPARTMENT_OTHER): Payer: Self-pay | Admitting: Physical Therapy

## 2023-11-10 DIAGNOSIS — R262 Difficulty in walking, not elsewhere classified: Secondary | ICD-10-CM

## 2023-11-10 DIAGNOSIS — M6281 Muscle weakness (generalized): Secondary | ICD-10-CM | POA: Diagnosis not present

## 2023-11-10 DIAGNOSIS — R252 Cramp and spasm: Secondary | ICD-10-CM | POA: Diagnosis not present

## 2023-11-10 DIAGNOSIS — M5459 Other low back pain: Secondary | ICD-10-CM | POA: Diagnosis not present

## 2023-11-10 NOTE — Therapy (Signed)
 OUTPATIENT PHYSICAL THERAPY THORACOLUMBAR Treatment/Progress note    Patient Name: Dawn [name] MRN: [id number] DOB:06-Nov-1945, 78 y.o., female Today's Date: 11/10/2023  END OF SESSION:  PT End of Session - 11/10/23 1013     Visit Number 53    Number of Visits 59    Date for PT Re-Evaluation 12/11/23    Authorization Type Progress note done at 49    PT Start Time 1016    PT Stop Time 1057    PT Time Calculation (min) 41 min    Activity Tolerance Patient tolerated treatment well;No increased pain    Behavior During Therapy [facility] for tasks assessed/performed                         Past Medical History:  Diagnosis Date   Allergy    CAD (coronary artery disease)    s/p DES to RCA in 06/2021   Dry eyes    Endometrial polyp    Essential hypertension 08/25/2018   GERD (gastroesophageal reflux disease)    History of hiatal hernia    History of kidney stones    Hot flashes, menopausal 10/13/2011   Estradiol  started    Hyperlipidemia    Jaundice as teenager   no problems since   Memory loss 09/21/2019     1/2 of feet numb all the time   Multiple sclerosis (HCC) dx 2001   Neuropathy    bilateral feet   Osteoarthritis    Sjogren's syndrome (HCC) dx oct 2021   sore muscvles, dry mouth and eyes   Stroke (HCC)    Vertigo    Vision abnormalities    Past Surgical History:  Procedure Laterality Date   ABDOMINAL HYSTERECTOMY  2002   partial   COLONOSCOPY  01/27/2022   2 day prep   colonscopy  2011   CORONARY STENT INTERVENTION N/A 07/18/2021   Procedure: CORONARY STENT INTERVENTION;  Surgeon: Lucendia Rusk, MD;  Location: [facility];  Service: Cardiovascular;  Laterality: N/A;   DILATATION & CURETTAGE/HYSTEROSCOPY WITH MYOSURE N/A 06/08/2020   Procedure: DILATATION & CURETTAGE/HYSTEROSCOPY/Polypectomy WITH MYOSURE;  Surgeon: Olin Bertin, DO;  Location: [facility];  Service: Gynecology;  Laterality: N/A;   LEFT HEART CATH  AND CORONARY ANGIOGRAPHY N/A 07/18/2021   Procedure: LEFT HEART CATH AND CORONARY ANGIOGRAPHY;  Surgeon: Lucendia Rusk, MD;  Location: [facility];  Service: Cardiovascular;  Laterality: N/A;   LUMBAR FUSION  2001   TOTAL HIP ARTHROPLASTY Right 01/17/2021   Procedure: TOTAL HIP ARTHROPLASTY ANTERIOR APPROACH;  Surgeon: Adonica Hoose, MD;  Location: [facility];  Service: Orthopedics;  Laterality: Right;   UPPER GI ENDOSCOPY  yrs ago   Patient Active Problem List   Diagnosis Date Noted   History of gross hematuria 11/02/2023   Constipation 11/02/2023   Chronic diastolic congestive heart failure (HCC) 11/02/2023   UTI (urinary tract infection) 07/09/2023   OAB (overactive bladder) 07/03/2023   Statin myopathy 06/26/2023   History of CVA (cerebrovascular accident) 06/26/2023   Rheumatoid arthritis involving right hand, unspecified whether rheumatoid factor present (HCC) 05/22/2023   Atherosclerotic heart disease of native coronary artery with other forms of angina pectoris (HCC) 05/22/2023   Positive colorectal cancer screening using Cologuard test 11/14/2021   Coronary artery disease    Statin intolerance 04/19/2021   Osteoarthritis of right hip 01/17/2021   Status post THR (total hip replacement) 01/17/2021   Chronic bilateral low back pain without  sciatica 10/02/2020   Chronic pain syndrome 06/20/2020   Post laminectomy syndrome 06/20/2020   Sjogren's disease (HCC) 05/23/2020   Primary osteoarthritis of both hands 05/23/2020   Primary osteoarthritis of both feet 05/23/2020   Sjogren's syndrome (HCC) 05/23/2020   Other secondary scoliosis, lumbar region 12/22/2019   Spondylosis without myelopathy or radiculopathy, lumbar region 12/22/2019   Cervical radiculopathy 10/18/2019   Numbness 09/21/2019   Bilateral carpal tunnel syndrome 09/21/2019   High risk medication use 09/21/2019   Memory loss 09/21/2019   Essential hypertension 08/25/2018   Abnormal SPEP 02/19/2018    Hand pain 02/20/2017   Multiple joint pain 02/18/2017   Disturbed cognition 06/25/2016   Impaired cognition 06/25/2016   Sciatica, right side 10/30/2015   Chronic fatigue 08/04/2014   Gait disturbance 08/04/2014   Unspecified visual disturbance 01/31/2013   Transient vision disturbance 01/31/2013   Colon polyps 11/03/2011   POSTHERPETIC NEURALGIA 10/03/2009   DISPLCMT LUMBAR INTERVERT DISC W/O MYELOPATHY 09/05/2009   RENAL CALCULUS, RECURRENT 09/04/2009   Hyperlipidemia 08/31/2009   Multiple sclerosis (HCC) 08/31/2009   ALLERGIC RHINITIS 08/31/2009  Progress Note Reporting Period 07/23/2023 to 10/02/2023  See note below for Objective Data and Assessment of Progress/Goals.     PCP: Jenetta Misty NP  REFERRING PROVIDER: Dr Christophe Cram   REFERRING DIAG: Low Back Pain   Rationale for Evaluation and Treatment: Rehabilitation  THERAPY DIAG:  Difficulty in walking, not elsewhere classified  Other low back pain  Cervicalgia  Muscle weakness (generalized)  Cramp and spasm  Chronic bilateral low back pain without sciatica  ONSET DATE: About 11 days ago her nerve abiliation wore off.   SUBJECTIVE:                                                                                                                                                                                           SUBJECTIVE STATEMENT: 4/22 Pt says she is feeling better today. Balance is doing better and enjoyed the exercises we did last time. Is still weary of her balance.   PERTINENT HISTORY:  Right Hip replacement 2022, Low back Pain, MS, TIA October 2023; MI, November 2022; CAD, Sjorgens syndrome  PAIN:  Are you having pain? Yes: NPRS scale: 6/10 Pain location left side lumbar spine Pain description: aching  Aggravating factors: standing and walking  Relieving factors: rest    PRECAUTIONS: None  WEIGHT BEARING RESTRICTIONS: No  FALLS:  Has patient fallen in last 6 months? 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   LUMBAR ROM:   AROM eval 5/15 10/3  Flexion Limited 50 % with pain  Mild improvement but still about 50%  Improved pain but still limited  Extension No limit     Right lateral flexion     Left lateral flexion     Right rotation Limited 50% with pain     Left rotation Limited 50% with pain      (Blank rows = not tested)  LOWER EXTREMITY ROM:     Passive  Right eval Left eval Right  Right  7/24 Right 1/2  Hip flexion 105 90 105 110 with pain at end range  100 with pain   Hip extension   10 degrees from neutral before manual today. Too neutral after stretching     Hip abduction       Hip adduction       Hip internal rotation painful Painful  Painful  Full with less pain  Painful   Hip external rotation   20 degrees with pain  45 with pain  48 with pain   Knee flexion       Knee extension       Ankle dorsiflexion       Ankle plantarflexion       Ankle inversion       Ankle eversion        (Blank rows = not tested)  LOWER EXTREMITY MMT:    MMT Right eval Left eval Right 7/24 Left 7/24 Right  10/2 Left 10/2 right 1/2 left 1/2 Right  Left   Hip flexion 13.1 12.3 11.3 19.1 15.3 23.4 16.7 23.4 17.5 20.4  Hip extension            Hip abduction 11.5 10.1 12.6 17.8 23.8 28.5  25.6 27.9 26.6 20.9  Hip adduction            Hip internal rotation            Hip external rotation            Knee flexion            Knee extension 14.0 13.3 19.4 18.3 23.8 23.6 22.6 24.8 28.8 20.7  Ankle dorsiflexion            Ankle plantarflexion            Ankle inversion            Ankle eversion             (Blank rows = not tested)  FUNCTIONAL TESTS:  5 times sit to stand: 28 sec  5/15 5 times sit to stand test 20 seconds 7/25 24 sec  10/16 17 sec 1/2 17 seconds 3/14 18 sec   3-minute walk test 150 feet without requirement of seated rest break 5/15  7/25 3 min walk test 200'   10/16 3 min walk test 379'   3/14   GAIT:  TODAY'S TREATMENT:                                                                                                                              DATE:  4/22 Manual:  All PROM performed with distraction to reduce pain and improve movement Trigger point therapy to UT and cervical paraspinals Theracane teaching for cervical and lumbar regions  There-ex: Nustep warm up lvl 3  Neuro-Re-ed  Seated rows yellow RTB 3x10  Seated pullovers yellow RTB 3x10  Pt education on posture and muscle activation while using walker   4/15 Manual: PROM p Roller to anterior hip and lateral hip  There-ex:  Nu-step warm up lvl 3 Seated hip adductor ball squeezes 3x10 Seated LAQ with red RTB 3x10  Neuro-Re-ed   90/90 ball press 3x15  90/90 double knee to chest 3x15  Supine march 3x10  Hip abduction 3x12 red     4/9 Manual: PROM performed with distraction to reduce pain and improve movement Seated glute stretch There-ex: Nu-step warm up lvl 3 Seated hip adductor ball squeezes 3x10 Seated LAQ with red RTB 3x10  Neuro-Re-ed  Step onto and off air-ex x20  Narrow base 3x30 sec hold  Standing marching 3x10   3/26 Manual: STM to low back paraspinals, lateral hip  paraspinals and into the anterior hip on the left  There-ex: Nu-step 5 min L5  Ball roll out fwd x10  Ball roll out lateral x10 each direction LTR 2x15    Nuro-Re-ed  Ball press x20   At this time the patient began to report syncope. Her B?P was measured. She reports baseline vertigo.      3/14 Nu-step 5 min L5  Ball roll out fwd x10  Ball roll out lateral x10 each direction LTR 2x15    Strength testing  Neuro-re-ed   Ball press x20  Hip adduction with a bal l   Gait: walk test      2/26  There-ex: Nu-step 5 min L5  Ball roll out fwd x10  Ball roll out lateral x10 each direction  [employer] press x20   Neuro-re-ed  Row 3x10 red  Shoulder extnesion 3x10 red   Reviewed set up and rest   Step onto and off air-ex x20  Narrow base 3x30 sec hold  Heel/toe x20    PATIENT EDUCATION:  Education details: reviewed HEP, symptom management  Person educated: Patient Education method: Explanation, Demonstration, Tactile cues, Verbal cues, and Handouts Education comprehension: verbalized understanding, returned demonstration, verbal cues required, tactile cues required, and needs further education  HOME EXERCISE PROGRAM: Has previous HEP. Will review next visit  ASSESSMENT:  CLINICAL IMPRESSION: 4/22 Pt warmed up on the nustep for with no increase in symptoms. Subjective was taken during warm up. STM and trigger point therapy was done on cervical paraspinals and UT. Pt was educated on theracane  teaching and proper positioning for both cervical and lumbar region. Neuro re-ed exercises were done with cueing on body position, alignment, and muscle activation. We also reviewed mechanics for the walker focusing on upright posture and shoulder alignment. Pt will continue to benefit from skilled physical therapy to increase functional movement for ADL's.    OBJECTIVE IMPAIRMENTS: Abnormal gait, decreased activity tolerance, decreased balance, decreased  endurance, decreased knowledge of use of DME, decreased mobility, difficulty walking, decreased ROM, decreased strength, increased muscle spasms, and pain.   ACTIVITY LIMITATIONS: carrying, lifting, bending, standing, squatting, sleeping, stairs, transfers, and locomotion level  PARTICIPATION LIMITATIONS: meal prep, cleaning, laundry, driving, shopping, community activity, and yard work  PERSONAL FACTORS: Right Hip replacement 2022, Low back Pain, MS, TIA October 2023; MI, November 2022; CAD, Sjorgens syndrome are also affecting patient's functional outcome. MS  REHAB POTENTIAL: Good  CLINICAL DECISION MAKING: Evolving/moderate complexity declining mobility   EVALUATION COMPLEXITY: Moderate   GOALS: Goals reviewed with patient? No  SHORT TERM GOALS: Target date: 09/29/2022    Patient will increase gross bilateral lower extremity strength by 5 pounds Baseline: Goal status: has returned to baseline from a few progress notes ago 10/16  2.  Patient will increase lumbar flexion by 10 degrees Baseline:  Goal status:  mild improvement but still painful 10/16  3.  Patient will be independent with basic after exercise program and stretching program Baseline:  Goal status: has basic HEP achieved 10/16   LONG TERM GOALS: Target date: 09/29/2022     Patient will stand for greater than 30 minutes without increased pain Baseline:  Goal status: improving 5/15  continued improvement 10/16   2.  Patient will pick item up off the floor without increased low back pain Baseline:  Goal status:still having pain 5/15 improved but still painful 10/16   3.  Patient will ambulate community distances without report of increased low back pain Baseline:  Goal status:limited by pain but maintaining 7/25  4.  Patient will report improved ability to sleep through the night secondary to back pain Baseline:  Goal status: continues to have pain at night 7/25    PLAN:  PT FREQUENCY: 1x/week  PT  DURATION: 10 weeks   PLANNED INTERVENTIONS: Therapeutic exercises, Therapeutic activity, Neuromuscular re-education, Balance training, Gait training, Patient/Family education, Self Care, Joint mobilization, Stair training, DME instructions, Aquatic Therapy, Dry Needling, Spinal mobilization, Cryotherapy, Moist heat, Ionotophoresis 4mg /ml Dexamethasone , and Manual therapy.  PLAN FOR NEXT SESSION: Consider manual therapy to the back and hips, consider trigger point dry needling to back and hips.  Reviewed basic HEP.  Begin active strengthening of hips and core.  Begin gait training and functional mobility training.       Katheran Palms, Student-PT 10/28/2022, 11:46 AM   I have reviewed and concur with this student's documentation.   During this treatment session, the therapist was present, participating in and directing the treatment.

## 2023-11-10 NOTE — Telephone Encounter (Signed)
 Pharmacy Patient Advocate Encounter  Received notification from [employer] that Prior Authorization for Modafinil  200MG  tablets has been APPROVED from 07/22/2023 to 07/20/2024   PA #/Case ID/Reference #:  [id number]

## 2023-11-10 NOTE — Telephone Encounter (Signed)
 Pharmacy Patient Advocate Encounter   Received notification from Patient Pharmacy that prior authorization for Modafinil  200MG  tablets is required/requested.   Insurance verification completed.   The patient is insured through [employer] .   Per test claim: PA required; PA submitted to above mentioned insurance via CoverMyMeds Key/confirmation #/EOC [id number] Status is pending

## 2023-11-11 ENCOUNTER — Other Ambulatory Visit

## 2023-11-11 DIAGNOSIS — I5032 Chronic diastolic (congestive) heart failure: Secondary | ICD-10-CM | POA: Diagnosis not present

## 2023-11-11 DIAGNOSIS — I10 Essential (primary) hypertension: Secondary | ICD-10-CM | POA: Diagnosis not present

## 2023-11-12 LAB — COMPLETE METABOLIC PANEL WITHOUT GFR
AG Ratio: 2.4 (calc) (ref 1.0–2.5)
ALT: 14 U/L (ref 6–29)
AST: 17 U/L (ref 10–35)
Albumin: 4.1 g/dL (ref 3.6–5.1)
Alkaline phosphatase (APISO): 109 U/L (ref 37–153)
BUN: 13 mg/dL (ref 7–25)
CO2: 28 mmol/L (ref 20–32)
Calcium: 9.4 mg/dL (ref 8.6–10.4)
Chloride: 105 mmol/L (ref 98–110)
Creat: 0.65 mg/dL (ref 0.60–1.00)
Globulin: 1.7 g/dL — ABNORMAL LOW (ref 1.9–3.7)
Glucose, Bld: 91 mg/dL (ref 65–99)
Potassium: 4.2 mmol/L (ref 3.5–5.3)
Sodium: 141 mmol/L (ref 135–146)
Total Bilirubin: 0.3 mg/dL (ref 0.2–1.2)
Total Protein: 5.8 g/dL — ABNORMAL LOW (ref 6.1–8.1)

## 2023-11-12 LAB — CBC WITH DIFFERENTIAL/PLATELET
Absolute Lymphocytes: 663 {cells}/uL — ABNORMAL LOW (ref 850–3900)
Absolute Monocytes: 327 {cells}/uL (ref 200–950)
Basophils Absolute: 40 {cells}/uL (ref 0–200)
Basophils Relative: 1.2 %
Eosinophils Absolute: 129 {cells}/uL (ref 15–500)
Eosinophils Relative: 3.9 %
HCT: 41.3 % (ref 35.0–45.0)
Hemoglobin: 13.6 g/dL (ref 11.7–15.5)
MCH: 29.6 pg (ref 27.0–33.0)
MCHC: 32.9 g/dL (ref 32.0–36.0)
MCV: 89.8 fL (ref 80.0–100.0)
MPV: 10.9 fL (ref 7.5–12.5)
Monocytes Relative: 9.9 %
Neutro Abs: 2142 {cells}/uL (ref 1500–7800)
Neutrophils Relative %: 64.9 %
Platelets: 224 10*3/uL (ref 140–400)
RBC: 4.6 10*6/uL (ref 3.80–5.10)
RDW: 13.6 % (ref 11.0–15.0)
Total Lymphocyte: 20.1 %
WBC: 3.3 10*3/uL — ABNORMAL LOW (ref 3.8–10.8)

## 2023-11-12 LAB — TSH: TSH: 3.6 m[IU]/L (ref 0.40–4.50)

## 2023-11-16 DIAGNOSIS — M47816 Spondylosis without myelopathy or radiculopathy, lumbar region: Secondary | ICD-10-CM | POA: Diagnosis not present

## 2023-11-17 ENCOUNTER — Ambulatory Visit (HOSPITAL_BASED_OUTPATIENT_CLINIC_OR_DEPARTMENT_OTHER): Payer: Medicare Other | Admitting: Physical Therapy

## 2023-11-17 DIAGNOSIS — R262 Difficulty in walking, not elsewhere classified: Secondary | ICD-10-CM

## 2023-11-17 DIAGNOSIS — M5459 Other low back pain: Secondary | ICD-10-CM | POA: Diagnosis not present

## 2023-11-17 DIAGNOSIS — R252 Cramp and spasm: Secondary | ICD-10-CM | POA: Diagnosis not present

## 2023-11-17 DIAGNOSIS — M6281 Muscle weakness (generalized): Secondary | ICD-10-CM

## 2023-11-17 NOTE — Therapy (Signed)
 OUTPATIENT PHYSICAL THERAPY THORACOLUMBAR Treatment/Progress note    Patient Name: Dawn [name] MRN: [id number] DOB:[birth year], 78 y.o., female Today's Date: 11/17/2023  END OF SESSION:  PT End of Session - 11/17/23 1018     Visit Number 54    Number of Visits 59    Date for PT Re-Evaluation 12/11/23    Authorization Type Progress note done at 49    PT Start Time 1016    PT Stop Time 1058    PT Time Calculation (min) 42 min    Activity Tolerance Patient tolerated treatment well;No increased pain    Behavior During Therapy [facility] for tasks assessed/performed                         Past Medical History:  Diagnosis Date   Allergy    CAD (coronary artery disease)    s/p DES to RCA in 06/2021   Dry eyes    Endometrial polyp    Essential hypertension 08/25/2018   GERD (gastroesophageal reflux disease)    History of hiatal hernia    History of kidney stones    Hot flashes, menopausal 10/13/2011   Estradiol  started    Hyperlipidemia    Jaundice as teenager   no problems since   Memory loss 09/21/2019     1/2 of feet numb all the time   Multiple sclerosis (HCC) dx 2001   Neuropathy    bilateral feet   Osteoarthritis    Sjogren's syndrome (HCC) dx oct 2021   sore muscvles, dry mouth and eyes   Stroke (HCC)    Vertigo    Vision abnormalities    Past Surgical History:  Procedure Laterality Date   ABDOMINAL HYSTERECTOMY  2002   partial   COLONOSCOPY  01/27/2022   2 day prep   colonscopy  2011   CORONARY STENT INTERVENTION N/A 07/18/2021   Procedure: CORONARY STENT INTERVENTION;  Surgeon: Lucendia Rusk, MD;  Location: [facility] IN[facility]IVE [facility];  Service: Cardiovascular;  Laterality: N/A;   DILATATION & CURETTAGE/HYSTEROSCOPY WITH MYOSURE N/A 06/08/2020   Procedure: DILATATION & CURETTAGE/HYSTEROSCOPY/Polypectomy WITH MYOSURE;  Surgeon: Olin Bertin, DO;  Location: [facility];  Service: Gynecology;  Laterality: N/A;   LEFT HEART CATH  AND CORONARY ANGIOGRAPHY N/A 07/18/2021   Procedure: LEFT HEART CATH AND CORONARY ANGIOGRAPHY;  Surgeon: Lucendia Rusk, MD;  Location: [facility] INV[facility] CV [facility];  Service: Cardiovascular;  Laterality: N/A;   LUMBAR FUSION  2001   TOTAL HIP ARTHROPLASTY Right 01/17/2021   Procedure: TOTAL HIP ARTHROPLASTY ANTERIOR APPROACH;  Surgeon: Adonica Hoose, MD;  Location: [facility];  Service: Orthopedics;  Laterality: Right;   UPPER GI ENDOSCOPY  yrs ago   Patient Active Problem List   Diagnosis Date Noted   History of gross hematuria 11/02/2023   Constipation 11/02/2023   Chronic diastolic congestive heart failure (HCC) 11/02/2023   UTI (urinary tract infection) 07/09/2023   OAB (overactive bladder) 07/03/2023   Statin myopathy 06/26/2023   History of CVA (cerebrovascular accident) 06/26/2023   Rheumatoid arthritis involving right hand, unspecified whether rheumatoid factor present (HCC) 05/22/2023   Atherosclerotic heart disease of native coronary artery with other forms of angina pectoris (HCC) 05/22/2023   Positive colorectal cancer screening using Cologuard test 11/14/2021   Coronary artery disease    Statin intolerance 04/19/2021   Osteoarthritis of right hip 01/17/2021   Status post THR (total hip replacement) 01/17/2021   Chronic bilateral low back pain without  sciatica 10/02/2020   Chronic pain syndrome 06/20/2020   Post laminectomy syndrome 06/20/2020   Sjogren's disease (HCC) 05/23/2020   Primary osteoarthritis of both hands 05/23/2020   Primary osteoarthritis of both feet 05/23/2020   Sjogren's syndrome (HCC) 05/23/2020   Other secondary scoliosis, lumbar region 12/22/2019   Spondylosis without myelopathy or radiculopathy, lumbar region 12/22/2019   Cervical radiculopathy 10/18/2019   Numbness 09/21/2019   Bilateral carpal tunnel syndrome 09/21/2019   High risk medication use 09/21/2019   Memory loss 09/21/2019   Essential hypertension 08/25/2018   Abnormal SPEP 02/19/2018    Hand pain 02/20/2017   Multiple joint pain 02/18/2017   Disturbed cognition 06/25/2016   Impaired cognition 06/25/2016   Sciatica, right side 10/30/2015   Chronic fatigue 08/04/2014   Gait disturbance 08/04/2014   Unspecified visual disturbance 01/31/2013   Transient vision disturbance 01/31/2013   Colon polyps 11/03/2011   POSTHERPETIC NEURALGIA 10/03/2009   DISPLCMT LUMBAR INTERVERT DISC W/O MYELOPATHY 09/05/2009   RENAL CALCULUS, RECURRENT 09/04/2009   Hyperlipidemia 08/31/2009   Multiple sclerosis (HCC) 08/31/2009   ALLERGIC RHINITIS 08/31/2009  Progress Note Reporting Period 07/23/2023 to 10/02/2023  See note below for Objective Data and Assessment of Progress/Goals.     PCP: Jenetta Misty NP  REFERRING PROVIDER: Dr Christophe Cram   REFERRING DIAG: Low Back Pain   Rationale for Evaluation and Treatment: Rehabilitation  THERAPY DIAG:  No diagnosis found.  ONSET DATE: About 11 days ago her nerve abiliation wore off.   SUBJECTIVE:                                                                                                                                                                                           SUBJECTIVE STATEMENT: 4/22 Pt says she is feeling better today. Balance is doing better and enjoyed the exercises we did last time. Is still weary of her balance.   PERTINENT HISTORY:  Right Hip replacement 2022, Low back Pain, MS, TIA October 2023; MI, November 2022; CAD, Sjorgens syndrome  PAIN:  Are you having pain? Yes: NPRS scale: 6/10 Pain location left side lumbar spine Pain description: aching  Aggravating factors: standing and walking  Relieving factors: rest    PRECAUTIONS: None  WEIGHT BEARING RESTRICTIONS: No  FALLS:  Has patient fallen in last 6 months? 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   LUMBAR ROM:   AROM eval 5/15 10/3  Flexion Limited 50 % with pain  Mild improvement but still about 50%  Improved pain but still limited  Extension No limit     Right lateral flexion     Left lateral flexion     Right rotation Limited 50% with pain     Left rotation Limited 50% with pain      (Blank rows = not tested)  LOWER EXTREMITY ROM:     Passive  Right eval Left eval Right  Right  7/24 Right 1/2  Hip flexion 105 90 105 110 with pain at end range  100 with pain   Hip extension   10 degrees from neutral before manual today. Too neutral after stretching     Hip abduction       Hip adduction       Hip internal rotation painful Painful  Painful  Full with less pain  Painful   Hip external rotation   20 degrees with pain  45 with pain  48 with pain   Knee flexion       Knee extension       Ankle dorsiflexion       Ankle plantarflexion       Ankle inversion       Ankle eversion        (Blank rows = not tested)  LOWER EXTREMITY MMT:    MMT Right eval Left eval Right 7/24 Left 7/24 Right  10/2 Left 10/2 right 1/2 left 1/2 Right  Left   Hip flexion 13.1 12.3 11.3 19.1 15.3 23.4 16.7 23.4 17.5 20.4  Hip extension            Hip abduction 11.5 10.1 12.6 17.8 23.8 28.5 25.6 27.9 26.6 20.9  Hip adduction            Hip internal rotation            Hip external rotation            Knee flexion            Knee extension 14.0 13.3 19.4  18.3 23.8 23.6 22.6 24.8 28.8 20.7  Ankle dorsiflexion            Ankle plantarflexion            Ankle inversion            Ankle eversion             (Blank rows = not tested)  FUNCTIONAL TESTS:  5 times sit to stand: 28 sec  5/15 5 times sit to stand test 20 seconds 7/25 24 sec  10/16 17 sec 1/2 17 seconds 3/14 18 sec   3-minute walk test 150 feet without requirement of seated rest break 5/15  7/25 3 min walk test 200'   10/16 3 min walk test 379'   3/14   GAIT:  TODAY'S TREATMENT:  DATE:  4/29 Manual: Therapy performed manual therapy to lumbar spine and lateral hip using a roller and trigger point release  There-ex: Nustep warm up lvl 3 LAQ 2x10 red  Hip abduction 3x15 red   Neuro-re-ed Bilateral ER yellow 3x10  Flexion with wand 3x10 with posture    4/22 Manual:  All PROM performed with distraction to reduce pain and improve movement Trigger point therapy to UT and cervical paraspinals Theracane teaching for cervical and lumbar regions  There-ex: Nustep warm up lvl 3  Neuro-Re-ed  Seated rows yellow RTB 3x10  Seated pullovers yellow RTB 3x10  Pt education on posture and muscle activation while using walker   4/15 Manual: PROM p Roller to anterior hip and lateral hip  There-ex:  Nu-step warm up lvl 3 Seated hip adductor ball squeezes 3x10 Seated LAQ with red RTB 3x10  Neuro-Re-ed   90/90 ball press 3x15  90/90 double knee to chest 3x15  Supine march 3x10  Hip abduction 3x12 red        PATIENT EDUCATION:  Education details: reviewed HEP, symptom management  Person educated: Patient Education method: Explanation, Demonstration, Tactile cues, Verbal cues, and Handouts Education comprehension: verbalized understanding, returned demonstration, verbal cues required,  tactile cues required, and needs further education  HOME EXERCISE PROGRAM: Has previous HEP. Will review next visit  ASSESSMENT:  CLINICAL IMPRESSION: 4/22 The patient tolerated treatment well. She had an improvement in pain following manual therapy. Her left hip has ben limiting. We reviewed exercises for the hip. Therapy will continue to progress as tolerated. We were able to use with a red band for her leg exercises. We also focused on upper body postural exercises. She tolerated well. She would benefit from further skilled therapy to continue to work on pain reduction in the left hip and postural/ core exercises.     OBJECTIVE IMPAIRMENTS: Abnormal gait, decreased activity tolerance, decreased balance, decreased endurance, decreased knowledge of use of DME, decreased mobility, difficulty walking, decreased ROM, decreased strength, increased muscle spasms, and pain.   ACTIVITY LIMITATIONS: carrying, lifting, bending, standing, squatting, sleeping, stairs, transfers, and locomotion level  PARTICIPATION LIMITATIONS: meal prep, cleaning, laundry, driving, shopping, community activity, and yard work  PERSONAL FACTORS: Right Hip replacement 2022, Low back Pain, MS, TIA October 2023; MI, November 2022; CAD, Sjorgens syndrome are also affecting patient's functional outcome. MS  REHAB POTENTIAL: Good  CLINICAL DECISION MAKING: Evolving/moderate complexity declining mobility   EVALUATION COMPLEXITY: Moderate   GOALS: Goals reviewed with patient? No  SHORT TERM GOALS: Target date: 09/29/2022    Patient will increase gross bilateral lower extremity strength by 5 pounds Baseline: Goal status: has returned to baseline from a few progress notes ago 10/16  2.  Patient will increase lumbar flexion by 10 degrees Baseline:  Goal status:  mild improvement but still painful 10/16  3.  Patient will be independent with basic after exercise program and stretching program Baseline:  Goal  status: has basic HEP achieved 10/16   LONG TERM GOALS: Target date: 09/29/2022     Patient will stand for greater than 30 minutes without increased pain Baseline:  Goal status: improving 5/15  continued improvement 4/29  2.  Patient will pick item up off the floor without increased low back pain Baseline:  Goal status:met back improved 4/29    3.  Patient will ambulate community distances without report of increased low back pain Baseline:  Goal status:limited by pain but maintaining  7/25  4.  Patient will report improved ability to sleep through the night secondary to back pain Baseline:  Goal status: continues to have pain at night 7/25    PLAN:  PT FREQUENCY: 1x/week  PT DURATION: 10 weeks   PLANNED INTERVENTIONS: Therapeutic exercises, Therapeutic activity, Neuromuscular re-education, Balance training, Gait training, Patient/Family education, Self Care, Joint mobilization, Stair training, DME instructions, Aquatic Therapy, Dry Needling, Spinal mobilization, Cryotherapy, Moist heat, Ionotophoresis 4mg /ml Dexamethasone , and Manual therapy.  PLAN FOR NEXT SESSION: Consider manual therapy to the back and hips, consider trigger point dry needling to back and hips.  Reviewed basic HEP.  Begin active strengthening of hips and core.  Begin gait training and functional mobility training.       Kitty Perkins, PT 10/28/2022, 10:22 AM   I have reviewed and concur with this student's documentation.   During this treatment session, the therapist was present, participating in and directing the treatment.

## 2023-11-18 ENCOUNTER — Ambulatory Visit (INDEPENDENT_AMBULATORY_CARE_PROVIDER_SITE_OTHER): Admitting: Obstetrics and Gynecology

## 2023-11-18 ENCOUNTER — Other Ambulatory Visit (HOSPITAL_COMMUNITY)
Admission: RE | Admit: 2023-11-18 | Discharge: 2023-11-18 | Disposition: A | Discharge status: Home / Self Care | Source: Other Acute Inpatient Hospital | Attending: Obstetrics and Gynecology | Admitting: Obstetrics and Gynecology

## 2023-11-18 VITALS — BP 173/94 | HR 62

## 2023-11-18 DIAGNOSIS — R82998 Other abnormal findings in urine: Secondary | ICD-10-CM

## 2023-11-18 DIAGNOSIS — N3946 Mixed incontinence: Secondary | ICD-10-CM

## 2023-11-18 DIAGNOSIS — R350 Frequency of micturition: Secondary | ICD-10-CM

## 2023-11-18 DIAGNOSIS — N3281 Overactive bladder: Secondary | ICD-10-CM

## 2023-11-18 DIAGNOSIS — R319 Hematuria, unspecified: Secondary | ICD-10-CM

## 2023-11-18 DIAGNOSIS — M47816 Spondylosis without myelopathy or radiculopathy, lumbar region: Secondary | ICD-10-CM

## 2023-11-18 DIAGNOSIS — N319 Neuromuscular dysfunction of bladder, unspecified: Secondary | ICD-10-CM

## 2023-11-18 LAB — URINALYSIS, ROUTINE W REFLEX MICROSCOPIC
Bilirubin Urine: NEGATIVE
Glucose, UA: NEGATIVE mg/dL
Hgb urine dipstick: NEGATIVE
Ketones, ur: NEGATIVE mg/dL
Leukocytes,Ua: NEGATIVE
Nitrite: NEGATIVE
Protein, ur: NEGATIVE mg/dL
Specific Gravity, Urine: 1.014 (ref 1.005–1.030)
pH: 7 (ref 5.0–8.0)

## 2023-11-18 LAB — POCT URINALYSIS DIPSTICK
Bilirubin, UA: NEGATIVE
Glucose, UA: NEGATIVE
Ketones, UA: NEGATIVE
Nitrite, UA: NEGATIVE
Protein, UA: NEGATIVE
Spec Grav, UA: 1.015 (ref 1.010–1.025)
Urobilinogen, UA: 0.2 U/dL
pH, UA: 7 (ref 5.0–8.0)

## 2023-11-18 NOTE — Progress Notes (Signed)
 [facility] Urodynamics Procedure  Referring Physician: Estil Heman, NP Date of Procedure: 11/18/2023  Dawn [name] is a 78 y.o. female who presents for urodynamic evaluation. Indication(s) for study: mixed incontinence and neurogenic bladder  Vital Signs: BP (!) 166/80   Pulse 73   Laboratory Results: A catheterized urine specimen revealed:  POC urine:  Lab Results  Component Value Date   COLORU yellow 11/18/2023   CLARITYU clear 11/18/2023   GLUCOSEUR Negative 11/18/2023   BILIRUBINUR negative 11/18/2023   KETONESU negative 11/18/2023   SPECGRAV 1.015 11/18/2023   RBCUR Trace intact 11/18/2023   PHUR 7.0 11/18/2023   PROTEINUR Negative 11/18/2023   UROBILINOGEN 0.2 11/18/2023   LEUKOCYTESUR Trace (A) 11/18/2023     Voiding Diary: Deferred  Procedure Timeout:  The correct patient was verified and the correct procedure was verified. The patient was in the correct position and safety precautions were reviewed based on at the patient's history.  Urodynamic Procedure A 103F dual lumen urodynamics catheter was placed under sterile conditions into the patient's bladder. A 103F catheter was placed into the rectum in order to measure abdominal pressure. EMG patches were placed in the appropriate position.  All connections were confirmed and calibrations/adjusted made. Saline was instilled into the bladder through the dual lumen catheters.  Cough/valsalva pressures were measured periodically during filling.  Patient was allowed to void.  The bladder was then emptied of its residual.  UROFLOW: Revealed a Qmax of 11 mL/sec.  She voided 68 mL and had a residual of 5 mL.  It was a intermittent pattern and represented normal habits though interpretation limited due to low voided volume.  CMG: This was performed with sterile water  in the sitting position at a fill rate of 30 mL/min.    First sensation of fullness was 34 mLs,  First urge was 200 mLs,  Strong urge  was 258 mLs and  Capacity was 338 mLs  Stress incontinence was not demonstrated Highest negative CLPP was 90 cmH20 at 270 ml. Highest negative VLPP was 58 cmH20at 270 ml.   Detrusor function was normal, with no phasic contractions seen.   Compliance:  Borderline low. End fill detrusor pressure was 11cmH20.  Calculated compliance was 24mL/cmH20  UPP: MUCP without barrier reduction was 54 cm of water .    MICTURITION STUDY: Voiding was performed without reduction in the sitting position.  Pdet at Qmax was 10 cm of water .  Qmax was 16 mL/sec.  It was a prolonged intermittent pattern.  She voided 328 mL and had a residual of 10 mL.  It was a volitional void, sustained detrusor contraction was present and abdominal straining was not present  EMG: This was performed with patches.  She had voluntary contractions, recruitment with fill was present and urethral sphincter was not relaxed with void.  The details of the procedure with the study tracings have been scanned into EPIC.   Urodynamic Impression:  1. Sensation was normal; capacity was normal 2. Stress Incontinence was not demonstrated at normal pressures; 3. Detrusor Overactivity was not demonstrated leakage. 4. Emptying was dysfunctional with a normal PVR, a sustained detrusor contraction present,  abdominal straining not present, dyssynergic urethral sphincter activity on EMG.  Plan: - The patient will follow up  to discuss the findings and treatment options.

## 2023-11-18 NOTE — Patient Instructions (Signed)
 Taking Care of Yourself after Urodynamics   Drink plenty of water for a day or two following your procedure. Try to have about 8 ounces (one cup) at a time, and do this 6 times or more per day unless you have fluid restrictitons AVOID irritative beverages such as coffee, tea, soda, alcoholic or citrus drinks for a day or two, as this may cause burning with urination. You may experience some discomfort or a burning sensation with urination after having this procedure. You can use over the counter Azo or pyridium to help with burning and follow the instructions on the packaging. If it does not improve within 1-2 days, or other symptoms appear (fever, chills, or difficulty urinating) call the office to speak to a nurse.  You may return to normal daily activities such as work, school, driving, exercising and housework on the day of the procedure.

## 2023-11-20 LAB — URINE CULTURE: Culture: 80000 — AB

## 2023-11-20 NOTE — Progress Notes (Signed)
 Gemtesa  75 mg appeal has been approved. Approval: 07/22/2023 - 07/20/2024 Reference number: [id number]

## 2023-11-25 ENCOUNTER — Ambulatory Visit (HOSPITAL_COMMUNITY)
Admission: RE | Admit: 2023-11-25 | Discharge: 2023-11-25 | Disposition: A | Discharge status: Home / Self Care | Source: Ambulatory Visit | Attending: Obstetrics | Admitting: Obstetrics

## 2023-11-25 DIAGNOSIS — N3281 Overactive bladder: Secondary | ICD-10-CM | POA: Insufficient documentation

## 2023-11-25 DIAGNOSIS — J449 Chronic obstructive pulmonary disease, unspecified: Secondary | ICD-10-CM | POA: Diagnosis not present

## 2023-11-25 DIAGNOSIS — I700 Atherosclerosis of aorta: Secondary | ICD-10-CM | POA: Insufficient documentation

## 2023-11-25 DIAGNOSIS — G35 Multiple sclerosis: Secondary | ICD-10-CM | POA: Insufficient documentation

## 2023-11-25 DIAGNOSIS — K449 Diaphragmatic hernia without obstruction or gangrene: Secondary | ICD-10-CM | POA: Diagnosis not present

## 2023-11-25 DIAGNOSIS — R310 Gross hematuria: Secondary | ICD-10-CM | POA: Diagnosis not present

## 2023-11-25 DIAGNOSIS — N200 Calculus of kidney: Secondary | ICD-10-CM | POA: Diagnosis not present

## 2023-11-25 DIAGNOSIS — Z87898 Personal history of other specified conditions: Secondary | ICD-10-CM | POA: Diagnosis not present

## 2023-11-25 MED ORDER — IOHEXOL 300 MG/ML  SOLN
100.0000 mL | Freq: Once | INTRAMUSCULAR | Status: AC | PRN
  Administered 2023-11-25: 100 mL via INTRAVENOUS

## 2023-11-26 ENCOUNTER — Encounter: Payer: Self-pay | Admitting: Obstetrics and Gynecology

## 2023-11-26 ENCOUNTER — Other Ambulatory Visit: Admitting: Obstetrics

## 2023-11-27 DIAGNOSIS — M7062 Trochanteric bursitis, left hip: Secondary | ICD-10-CM | POA: Diagnosis not present

## 2023-11-27 DIAGNOSIS — M7061 Trochanteric bursitis, right hip: Secondary | ICD-10-CM | POA: Diagnosis not present

## 2023-12-01 ENCOUNTER — Ambulatory Visit (HOSPITAL_BASED_OUTPATIENT_CLINIC_OR_DEPARTMENT_OTHER): Attending: Physical Medicine and Rehabilitation | Admitting: Physical Therapy

## 2023-12-01 ENCOUNTER — Encounter (HOSPITAL_BASED_OUTPATIENT_CLINIC_OR_DEPARTMENT_OTHER): Payer: Self-pay | Admitting: Physical Therapy

## 2023-12-01 DIAGNOSIS — R262 Difficulty in walking, not elsewhere classified: Secondary | ICD-10-CM | POA: Insufficient documentation

## 2023-12-01 DIAGNOSIS — R252 Cramp and spasm: Secondary | ICD-10-CM | POA: Insufficient documentation

## 2023-12-01 DIAGNOSIS — M6281 Muscle weakness (generalized): Secondary | ICD-10-CM | POA: Insufficient documentation

## 2023-12-01 DIAGNOSIS — M5459 Other low back pain: Secondary | ICD-10-CM | POA: Insufficient documentation

## 2023-12-02 NOTE — Therapy (Signed)
 OUTPATIENT PHYSICAL THERAPY THORACOLUMBAR Treatment/Progress note    Patient Name: Dawn [name] MRN: [id number] DOB:01/03/1946, 78 y.o., female Today's Date: 12/02/2023  END OF SESSION:  PT End of Session - 12/01/23 1310     Visit Number 56    Number of Visits 59    Date for PT Re-Evaluation 12/11/23    Authorization Type Progress note done at 49    PT Start Time 1300    PT Stop Time 1343    PT Time Calculation (min) 43 min    Activity Tolerance Patient tolerated treatment well;No increased pain    Behavior During Therapy [facility] for tasks assessed/performed                         Past Medical History:  Diagnosis Date   Allergy    CAD (coronary artery disease)    s/p DES to RCA in 06/2021   Dry eyes    Endometrial polyp    Essential hypertension 08/25/2018   GERD (gastroesophageal reflux disease)    History of hiatal hernia    History of kidney stones    Hot flashes, menopausal 10/13/2011   Estradiol  started    Hyperlipidemia    Jaundice as teenager   no problems since   Memory loss 09/21/2019     1/2 of feet numb all the time   Multiple sclerosis (HCC) dx 2001   Neuropathy    bilateral feet   Osteoarthritis    Sjogren's syndrome (HCC) dx oct 2021   sore muscvles, dry mouth and eyes   Stroke (HCC)    Vertigo    Vision abnormalities    Past Surgical History:  Procedure Laterality Date   ABDOMINAL HYSTERECTOMY  2002   partial   COLONOSCOPY  01/27/2022   2 day prep   colonscopy  2011   CORONARY STENT INTERVENTION N/A 07/18/2021   Procedure: CORONARY STENT INTERVENTION;  Surgeon: Lucendia Rusk, MD;  Location: [facility] INVASIVE CV LAB;  Service: Cardiovascular;  Laterality: N/A;   DILATATION & CURETTAGE/HYSTEROSCOPY WITH MYOSURE N/A 06/08/2020   Procedure: DILATATION & CURETTAGE/HYSTEROSCOPY/Polypectomy WITH MYOSURE;  Surgeon: Olin Bertin, DO;  Location: [facility];  Service: Gynecology;  Laterality: N/A;   LEFT HEART CATH  AND CORONARY ANGIOGRAPHY N/A 07/18/2021   Procedure: LEFT HEART CATH AND CORONARY ANGIOGRAPHY;  Surgeon: Lucendia Rusk, MD;  Location: [facility] INVASIVE CV LAB;  Service: Cardiovascular;  Laterality: N/A;   LUMBAR FUSION  2001   TOTAL HIP ARTHROPLASTY Right 01/17/2021   Procedure: TOTAL HIP ARTHROPLASTY ANTERIOR APPROACH;  Surgeon: Adonica Hoose, MD;  Location: [facility];  Service: Orthopedics;  Laterality: Right;   UPPER GI ENDOSCOPY  yrs ago   Patient Active Problem List   Diagnosis Date Noted   History of gross hematuria 11/02/2023   Constipation 11/02/2023   Chronic diastolic congestive heart failure (HCC) 11/02/2023   UTI (urinary tract infection) 07/09/2023   OAB (overactive bladder) 07/03/2023   Statin myopathy 06/26/2023   History of CVA (cerebrovascular accident) 06/26/2023   Rheumatoid arthritis involving right hand, unspecified whether rheumatoid factor present (HCC) 05/22/2023   Atherosclerotic heart disease of native coronary artery with other forms of angina pectoris (HCC) 05/22/2023   Positive colorectal cancer screening using Cologuard test 11/14/2021   Coronary artery disease    Statin intolerance 04/19/2021   Osteoarthritis of right hip 01/17/2021   Status post THR (total hip replacement) 01/17/2021   Chronic bilateral low back pain without  sciatica 10/02/2020   Chronic pain syndrome 06/20/2020   Post laminectomy syndrome 06/20/2020   Sjogren's disease (HCC) 05/23/2020   Primary osteoarthritis of both hands 05/23/2020   Primary osteoarthritis of both feet 05/23/2020   Sjogren's syndrome (HCC) 05/23/2020   Other secondary scoliosis, lumbar region 12/22/2019   Spondylosis without myelopathy or radiculopathy, lumbar region 12/22/2019   Cervical radiculopathy 10/18/2019   Numbness 09/21/2019   Bilateral carpal tunnel syndrome 09/21/2019   High risk medication use 09/21/2019   Memory loss 09/21/2019   Essential hypertension 08/25/2018   Abnormal SPEP 02/19/2018    Hand pain 02/20/2017   Multiple joint pain 02/18/2017   Disturbed cognition 06/25/2016   Impaired cognition 06/25/2016   Sciatica, right side 10/30/2015   Chronic fatigue 08/04/2014   Gait disturbance 08/04/2014   Unspecified visual disturbance 01/31/2013   Transient vision disturbance 01/31/2013   Colon polyps 11/03/2011   POSTHERPETIC NEURALGIA 10/03/2009   DISPLCMT LUMBAR INTERVERT DISC W/O MYELOPATHY 09/05/2009   RENAL CALCULUS, RECURRENT 09/04/2009   Hyperlipidemia 08/31/2009   Multiple sclerosis (HCC) 08/31/2009   ALLERGIC RHINITIS 08/31/2009  Progress Note Reporting Period 07/23/2023 to 10/02/2023  See note below for Objective Data and Assessment of Progress/Goals.     PCP: Jenetta Misty NP  REFERRING PROVIDER: Dr Christophe Cram   REFERRING DIAG: Low Back Pain   Rationale for Evaluation and Treatment: Rehabilitation  THERAPY DIAG:  Difficulty in walking, not elsewhere classified  Other low back pain  Muscle weakness (generalized)  Cramp and spasm  ONSET DATE: About 11 days ago her nerve abiliation wore off.   SUBJECTIVE:                                                                                                                                                                                           SUBJECTIVE STATEMENT: Patient reports she had injections last week in both hips.  She reports this helped her right hip significantly.  She sends pain in her left hip today.  She is not sure why. PERTINENT HISTORY:  Right Hip replacement 2022, Low back Pain, MS, TIA October 2023; MI, November 2022; CAD, Sjorgens syndrome  PAIN:  Are you having pain? Yes: NPRS scale: 6/10 Pain location left side lumbar spine Pain description: aching  Aggravating factors: standing and walking  Relieving factors: rest    PRECAUTIONS: None  WEIGHT BEARING RESTRICTIONS: No  FALLS:  Has patient fallen in last 6 months? 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   LUMBAR ROM:   AROM eval 5/15 10/3  Flexion Limited 50 % with pain  Mild improvement but still about 50%  Improved pain but still limited  Extension No limit     Right lateral flexion     Left lateral flexion     Right rotation Limited 50% with pain     Left rotation Limited 50% with pain      (Blank rows = not tested)  LOWER EXTREMITY ROM:     Passive  Right eval Left eval Right  Right  7/24 Right 1/2  Hip flexion 105 90 105 110 with pain at end range  100 with pain   Hip extension   10 degrees from neutral before manual today. Too neutral after stretching     Hip abduction       Hip adduction       Hip internal rotation painful Painful  Painful  Full with less pain  Painful   Hip external rotation   20 degrees with pain  45 with pain  48 with pain   Knee flexion       Knee extension       Ankle dorsiflexion       Ankle plantarflexion       Ankle inversion       Ankle eversion        (Blank rows = not tested)  LOWER EXTREMITY MMT:    MMT Right eval Left eval Right 7/24 Left 7/24 Right  10/2 Left 10/2 right 1/2 left 1/2 Right  Left   Hip flexion 13.1 12.3 11.3 19.1 15.3 23.4 16.7 23.4 17.5 20.4  Hip extension            Hip abduction 11.5 10.1 12.6 17.8 23.8 28.5 25.6 27.9 26.6 20.9  Hip adduction             Hip internal rotation            Hip external rotation            Knee flexion            Knee extension 14.0 13.3 19.4 18.3 23.8 23.6 22.6 24.8 28.8 20.7  Ankle dorsiflexion            Ankle plantarflexion            Ankle inversion            Ankle eversion             (Blank rows = not tested)  FUNCTIONAL TESTS:  5 times sit to stand: 28 sec  5/15 5 times sit to stand test 20 seconds 7/25 24 sec  10/16 17 sec 1/2 17 seconds 3/14 18 sec   3-minute walk test 150 feet without requirement of seated rest break 5/15  7/25 3 min walk test 200'   10/16 3 min walk test 379'   3/14   GAIT:  TODAY'S TREATMENT:  DATE:  5/14 Manual: Roller to left hip.  Trigger point release to the anterior hip.  Trigger point release to left lateral hip.  There-ex: Nustep warm up lvl 3 LAQ 3x10 red  Hip abduction 3x15 red  Seated march 3 x 10 Bilateral ER red band 3 x 10 Horizontal abduction red 3 x 10   4/29 Manual: Therapy performed manual therapy to lumbar spine and lateral hip using a roller and trigger point release  There-ex: Nustep warm up lvl 3 LAQ 2x10 red  Hip abduction 3x15 red   Neuro-re-ed Bilateral ER yellow 3x10  Flexion with wand 3x10 with posture    4/22 Manual:  All PROM performed with distraction to reduce pain and improve movement Trigger point therapy to UT and cervical paraspinals Theracane teaching for cervical and lumbar regions  There-ex: Nustep warm up lvl 3  Neuro-Re-ed  Seated rows yellow RTB 3x10  Seated pullovers yellow RTB 3x10  Pt education on posture and muscle activation while using walker   4/15 Manual: PROM p Roller to anterior hip and lateral hip  There-ex:  Nu-step warm up lvl 3 Seated hip adductor ball squeezes 3x10 Seated LAQ with red  RTB 3x10  Neuro-Re-ed   90/90 ball press 3x15  90/90 double knee to chest 3x15  Supine march 3x10  Hip abduction 3x12 red        PATIENT EDUCATION:  Education details: reviewed HEP, symptom management  Person educated: Patient Education method: Explanation, Demonstration, Tactile cues, Verbal cues, and Handouts Education comprehension: verbalized understanding, returned demonstration, verbal cues required, tactile cues required, and needs further education  HOME EXERCISE PROGRAM: Has previous HEP. Will review next visit  ASSESSMENT:  CLINICAL IMPRESSION:  Therapy focused on manual therapy to left hip.  She reported improved pain and improved ability to stand following trigger point release and roller to the hip.  She is able to complete her exercises.  She was encouraged to continue her exercises at home.  She will continue to benefit from skilled therapy to advance exercise program and continue to work on therapy to reduce pain and maintain mobility despite progressive neurological disorder.  OBJECTIVE IMPAIRMENTS: Abnormal gait, decreased activity tolerance, decreased balance, decreased endurance, decreased knowledge of use of DME, decreased mobility, difficulty walking, decreased ROM, decreased strength, increased muscle spasms, and pain.   ACTIVITY LIMITATIONS: carrying, lifting, bending, standing, squatting, sleeping, stairs, transfers, and locomotion level  PARTICIPATION LIMITATIONS: meal prep, cleaning, laundry, driving, shopping, community activity, and yard work  PERSONAL FACTORS: Right Hip replacement 2022, Low back Pain, MS, TIA October 2023; MI, November 2022; CAD, Sjorgens syndrome are also affecting patient's functional outcome. MS  REHAB POTENTIAL: Good  CLINICAL DECISION MAKING: Evolving/moderate complexity declining mobility   EVALUATION COMPLEXITY: Moderate   GOALS: Goals reviewed with patient? No  SHORT TERM GOALS: Target date: 09/29/2022    Patient  will increase gross bilateral lower extremity strength by 5 pounds Baseline: Goal status: has returned to baseline from a few progress notes ago 10/16  2.  Patient will increase lumbar flexion by 10 degrees Baseline:  Goal status:  mild improvement but still painful 10/16  3.  Patient will be independent with basic after exercise program and stretching program Baseline:  Goal status: has basic HEP achieved 10/16   LONG TERM GOALS: Target date: 09/29/2022     Patient will stand for greater than 30 minutes without increased pain Baseline:  Goal status: improving 5/15  continued improvement 4/29  2.  Patient will pick item up off the floor without increased low back pain Baseline:  Goal status:met back improved 4/29    3.  Patient will ambulate community distances without report of increased low back pain Baseline:  Goal status:limited by pain but maintaining 7/25  4.  Patient will report improved ability to sleep through the night secondary to back pain Baseline:  Goal status: continues to have pain at night 7/25    PLAN:  PT FREQUENCY: 1x/week  PT DURATION: 10 weeks   PLANNED INTERVENTIONS: Therapeutic exercises, Therapeutic activity, Neuromuscular re-education, Balance training, Gait training, Patient/Family education, Self Care, Joint mobilization, Stair training, DME instructions, Aquatic Therapy, Dry Needling, Spinal mobilization, Cryotherapy, Moist heat, Ionotophoresis 4mg /ml Dexamethasone , and Manual therapy.  PLAN FOR NEXT SESSION: Consider manual therapy to the back and hips, consider trigger point dry needling to back and hips.  Reviewed basic HEP.  Begin active strengthening of hips and core.  Begin gait training and functional mobility training.       Kitty Perkins, PT 10/28/2022, 9:33 AM   I have reviewed and concur with this student's documentation.   During this treatment session, the therapist was present, participating in and directing the  treatment.

## 2023-12-04 ENCOUNTER — Encounter: Payer: Self-pay | Admitting: Adult Health

## 2023-12-04 ENCOUNTER — Ambulatory Visit: Payer: Self-pay

## 2023-12-04 ENCOUNTER — Ambulatory Visit: Admitting: Adult Health

## 2023-12-04 VITALS — BP 144/80 | HR 75 | Temp 97.2°F | Resp 21 | Ht 62.0 in | Wt 185.0 lb

## 2023-12-04 DIAGNOSIS — J029 Acute pharyngitis, unspecified: Secondary | ICD-10-CM

## 2023-12-04 DIAGNOSIS — E782 Mixed hyperlipidemia: Secondary | ICD-10-CM

## 2023-12-04 DIAGNOSIS — J302 Other seasonal allergic rhinitis: Secondary | ICD-10-CM

## 2023-12-04 DIAGNOSIS — J209 Acute bronchitis, unspecified: Secondary | ICD-10-CM

## 2023-12-04 DIAGNOSIS — I10 Essential (primary) hypertension: Secondary | ICD-10-CM

## 2023-12-04 LAB — POCT RAPID STREP A (OFFICE): Rapid Strep A Screen: NEGATIVE

## 2023-12-04 MED ORDER — ALBUTEROL SULFATE HFA 108 (90 BASE) MCG/ACT IN AERS: 2.0000 | INHALATION_SPRAY | Freq: Four times a day (QID) | RESPIRATORY_TRACT | 0 refills | Status: DC | PRN

## 2023-12-04 MED ORDER — BENZONATATE 100 MG PO CAPS: 100.0000 mg | ORAL_CAPSULE | Freq: Three times a day (TID) | ORAL | 0 refills | Status: DC | PRN

## 2023-12-04 MED ORDER — DOXYCYCLINE HYCLATE 100 MG PO TABS: 100.0000 mg | ORAL_TABLET | Freq: Two times a day (BID) | ORAL | 0 refills | Status: AC

## 2023-12-04 MED ORDER — SACCHAROMYCES BOULARDII 250 MG PO CAPS: 250.0000 mg | ORAL_CAPSULE | Freq: Two times a day (BID) | ORAL | 0 refills | Status: AC

## 2023-12-04 NOTE — Telephone Encounter (Signed)
 Copied from CRM [id number]. Topic: Clinical - Red Word Triage >> Dec 04, 2023  7:42 AM Arlie Benedict B wrote: [facility] that prompted transfer to Nurse Triage: throat is very swollen, chest congestion and tightness, wheezing.   Chief Complaint: Shortness of Breath Symptoms: shortness of breath, dry cough, neck tightness per patient Frequency: 2 nights ago Pertinent Negatives: Patient denies known fever, nausea, vomiting, diarrhea, rashes,  Disposition: [] ED /[] Urgent Care (no appt availability in office) / [x] Appointment(In office/virtual)/ []  [place] [facility]/ [] Home Care/ [] Refused Recommended Disposition /[] [place] [facility]/ []  Follow-up with PCP Additional Notes: Patient called and advised that 2 nights ago she started feeling bad. Patient states that she around the tops of her neck/throat where her ears are feels swollen--She denies any difficulty getting air in and denies and rashes.  She states her ears were "popping" yesterday. She is able to take a deep breath without any chest pain.  Patient denies fever, nausea, vomiting, diarrhea, rashes. Patient states that she has no issues getting air in, she just feels very congested and dry. She states that she has Sjogren's disease so she stays "dry". Her throat is dry and irritated and she states the dry deep cough is bothering her. She has been taking some over the counter medications but not getting much better.  Appointment is made for today 12/04/2023 at 9am with Inge Mangle.  Care Advice is given as per protocol. Caller is advised that if anything worsens to go to the Emergency Room. Caller verbalized understanding and agreed with plan.   Reason for Disposition  [1] MILD difficulty breathing (e.g., minimal/no SOB at rest, SOB with walking, pulse <100) AND [2] NEW-onset or WORSE than normal  Answer Assessment - Initial Assessment Questions 1. RESPIRATORY STATUS: "Describe your breathing?" (e.g., wheezing, shortness of  breath, unable to speak, severe coughing)      Shortness of breath, chest tightness 2. ONSET: "When did this breathing problem begin?"      2 nights ago 3. PATTERN "Does the difficult breathing come and go, or has it been constant since it started?"      Constant---worse at night 4. SEVERITY: "How bad is your breathing?" (e.g., mild, moderate, severe)    - MILD: No SOB at rest, mild SOB with walking, speaks normally in sentences, can lie down, no retractions, pulse < 100.    - MODERATE: SOB at rest, SOB with minimal exertion and prefers to sit, cannot lie down flat, speaks in phrases, mild retractions, audible wheezing, pulse 100-120.    - SEVERE: Very SOB at rest, speaks in single words, struggling to breathe, sitting hunched forward, retractions, pulse > 120      Moderate-severe 5. RECURRENT SYMPTOM: "Have you had difficulty breathing before?" If Yes, ask: "When was the last time?" and "What happened that time?"      No 6. CARDIAC HISTORY: "Do you have any history of heart disease?" (e.g., heart attack, angina, bypass surgery, angioplasty)      Congestive Heart Failure 7. LUNG HISTORY: "Do you have any history of lung disease?"  (e.g., pulmonary embolus, asthma, emphysema)     No 8. CAUSE: "What do you think is causing the breathing problem?"      Unknown 9. OTHER SYMPTOMS: "Do you have any other symptoms? (e.g., dizziness, runny nose, cough, chest pain, fever)     Dry cough,  10. O2 SATURATION MONITOR:  "Do you use an oxygen saturation monitor (pulse oximeter) at home?" If Yes, ask: "What is  your reading (oxygen level) today?" "What is your usual oxygen saturation reading?" (e.g., 95%)         Dry cough,  12. TRAVEL: "Have you traveled out of the country in the last month?" (e.g., travel history, exposures)       No  Protocols used: Breathing Difficulty-A-AH

## 2023-12-04 NOTE — Progress Notes (Signed)
 [facility] clinic  Provider:  Inge Mangle DNP  Code Status:  Full Co  Goals of Care:     11/02/2023   10:32 AM  Advanced Directives  Does Patient Have a Medical Advance Directive? Yes  Type of Estate agent of [place];Living will;Out of facility DNR (pink MOST or yellow form)  Does patient want to make changes to medical advance directive? No - Patient declined  Copy of Healthcare Power of Attorney in Chart? Yes - validated most recent copy scanned in chart (See row information)     Chief Complaint  Patient presents with   Shortness of Breath    Wheezing and coughing and sore throat about 3 days.   Discussed the use of AI scribe software for clinical note transcription with the patient, who gave verbal consent to proceed.  HPI: Patient is a 78 y.o. female seen today for an acute visit for shortness of breath.  Two nights ago, she experienced wheezing while lying down, which woke her up. The following afternoon, she developed chest tightness, difficulty breathing, and wheezing. She took Mucinex last night, which provided some relief.  She has been coughing for the past two days, producing a small amount of yellow-white phlegm noted this morning. No fever, chills, or body aches. She has a sore throat, described as mildly swollen, but denies chest pain.  Her past medical history includes hypertension, managed with hydralazine  25 mg twice a day, carvedilol  6.25 mg twice a day, and losartan  100 mg daily. She also takes a baby aspirin  daily. Her systolic blood pressure is usually in the 130s, but can be higher in the morning after taking her medication, it goes down.  She has a history of seasonal allergies, managed with Allegra 180 mg daily, Flonase as needed, and a neti pot as needed. She has experienced vertigo and sinus issues in the past, particularly at the beginning of spring.  She is currently on Zetia  10 mg daily for hyperlipidemia.  She denies  smoking and has never used albuterol before, as she has not experienced wheezing in the past.     Past Medical History:  Diagnosis Date   Allergy    CAD (coronary artery disease)    s/p DES to RCA in 06/2021   Dry eyes    Endometrial polyp    Essential hypertension 08/25/2018   GERD (gastroesophageal reflux disease)    History of hiatal hernia    History of kidney stones    Hot flashes, menopausal 10/13/2011   Estradiol  started    Hyperlipidemia    Jaundice as teenager   no problems since   Memory loss 09/21/2019     1/2 of feet numb all the time   Multiple sclerosis (HCC) dx 2001   Neuropathy    bilateral feet   Osteoarthritis    Sjogren's syndrome (HCC) dx oct 2021   sore muscvles, dry mouth and eyes   Stroke (HCC)    Vertigo    Vision abnormalities     Past Surgical History:  Procedure Laterality Date   ABDOMINAL HYSTERECTOMY  2002   partial   COLONOSCOPY  01/27/2022   2 day prep   colonscopy  2011   CORONARY STENT INTERVENTION N/A 07/18/2021   Procedure: CORONARY STENT INTERVENTION;  Surgeon: Lucendia Rusk, MD;  Location: [facility];  Service: Cardiovascular;  Laterality: N/A;   DILATATION & CURETTAGE/HYSTEROSCOPY WITH MYOSURE N/A 06/08/2020   Procedure: DILATATION & CURETTAGE/HYSTEROSCOPY/Polypectomy WITH MYOSURE;  Surgeon: Arnett Lanius,  Cassandra A, DO;  Location: [facility];  Service: Gynecology;  Laterality: N/A;   LEFT HEART CATH AND CORONARY ANGIOGRAPHY N/A 07/18/2021   Procedure: LEFT HEART CATH AND CORONARY ANGIOGRAPHY;  Surgeon: Lucendia Rusk, MD;  Location: [facility] CV [facility];  Service: Cardiovascular;  Laterality: N/A;   LUMBAR FUSION  2001   TOTAL HIP ARTHROPLASTY Right 01/17/2021   Procedure: TOTAL HIP ARTHROPLASTY ANTERIOR APPROACH;  Surgeon: Adonica Hoose, MD;  Location: [facility];  Service: Orthopedics;  Laterality: Right;   UPPER GI ENDOSCOPY  yrs ago    Allergies  Allergen Reactions   Repatha  [Evolocumab ] Itching    Crestor  [Rosuvastatin ]     MYALGIA    Pravastatin     Statins Other (See Comments)    Muscle pain; Tolerates Crestor      Outpatient Encounter Medications as of 12/04/2023  Medication Sig   acetaminophen  (TYLENOL ) 500 MG tablet Take 1,000 mg by mouth every 6 (six) hours as needed for mild pain or moderate pain.   albuterol (VENTOLIN HFA) 108 (90 Base) MCG/ACT inhaler Inhale 2 puffs into the lungs every 6 (six) hours as needed for wheezing or shortness of breath.   aspirin  EC 81 MG tablet Take 81 mg by mouth daily. Swallow whole.   baclofen  (LIORESAL ) 10 MG tablet 1/2 to 1 po tid   benzonatate (TESSALON PERLES) 100 MG capsule Take 1 capsule (100 mg total) by mouth 3 (three) times daily as needed.   carvedilol  (COREG ) 6.25 MG tablet Take 1 tablet (6.25 mg total) by mouth 2 (two) times daily with a meal.   cholecalciferol (VITAMIN D ) 25 MCG (1000 UNIT) tablet Take 2,000 Units by mouth daily.   clonazePAM  (KLONOPIN ) 0.5 MG tablet Take 1 tablet (0.5 mg total) by mouth at bedtime.   doxycycline (VIBRA-TABS) 100 MG tablet Take 1 tablet (100 mg total) by mouth 2 (two) times daily for 10 days.   estradiol  (ESTRACE ) 0.1 MG/GM vaginal cream Place 0.5-1g nightly for two weeks then twice a week after   ezetimibe  (ZETIA ) 10 MG tablet Take 1 tablet (10 mg total) by mouth daily.   fexofenadine (ALLEGRA) 180 MG tablet Take 180 mg by mouth at bedtime.    Flaxseed, Linseed, (FLAXSEED OIL PO) Take 5-10 mLs by mouth 3 (three) times a week. Power   fluticasone (FLONASE) 50 MCG/ACT nasal spray Place 1 spray into both nostrils daily as needed for allergies or rhinitis.   hydrALAZINE  (APRESOLINE ) 25 MG tablet Take 1 tablet (25 mg total) by mouth in the morning and at bedtime.   Lidocaine  4 % PTCH Place 1 patch onto the skin daily as needed (mild pain). Remove & Discard patch within 12 hours or as directed by MD   losartan  (COZAAR ) 100 MG tablet Take 1 tablet (100 mg total) by mouth daily.   melatonin 5 MG TABS Take  5 mg by mouth at bedtime as needed (Sleep).   methylPREDNISolone  (MEDROL ) 4 MG tablet Taper from 6 pills po for one day to 1 pill po the last day over 6 days   modafinil  (PROVIGIL ) 200 MG tablet One po qAM and one po qNoon   nitroGLYCERIN  (NITROSTAT ) 0.4 MG SL tablet Place 1 tablet (0.4 mg total) under the tongue every 5 (five) minutes as needed for chest pain.   Omega-3 Fatty Acids (OMEGA 3 500 PO) Take 500 mg by mouth daily.   pantoprazole  (PROTONIX ) 40 MG tablet TAKE 1 TABLET(40 MG) BY MOUTH TWICE DAILY   saccharomyces boulardii (FLORASTOR) 250  MG capsule Take 1 capsule (250 mg total) by mouth 2 (two) times daily for 13 days.   tiZANidine (ZANAFLEX) 4 MG tablet Take 2 mg by mouth 3 (three) times daily as needed.   Vibegron  (GEMTESA ) 75 MG TABS Take 1 tablet (75 mg total) by mouth daily.   Vibegron  (GEMTESA ) 75 MG TABS Take 1 tablet (75 mg total) by mouth daily. (Patient not taking: Reported on 12/04/2023)   No facility-administered encounter medications on file as of 12/04/2023.    Review of Systems:  Review of Systems  Constitutional:  Negative for appetite change, chills, fatigue and fever.  HENT:  Positive for sore throat. Negative for congestion, hearing loss and rhinorrhea.   Eyes: Negative.   Respiratory:  Positive for cough, shortness of breath and wheezing.   Cardiovascular:  Negative for chest pain, palpitations and leg swelling.  Gastrointestinal:  Negative for abdominal pain, constipation, diarrhea, nausea and vomiting.  Genitourinary:  Negative for dysuria.  Musculoskeletal:  Negative for arthralgias, back pain and myalgias.  Skin:  Negative for color change, rash and wound.  Neurological:  Negative for dizziness, weakness and headaches.  Psychiatric/Behavioral:  Negative for behavioral problems. The patient is not nervous/anxious.     Health Maintenance  Topic Date Due   Medicare Annual Wellness (AWV)  09/06/2021   DTaP/Tdap/Td (2 - Td or Tdap) 11/20/2022   COVID-19  Vaccine (7 - Pfizer risk 2024-25 season) 10/02/2023   DEXA SCAN  11/01/2024 (Originally 12/20/2012)   INFLUENZA VACCINE  02/19/2024   Fecal DNA (Cologuard)  11/01/2024   Pneumonia Vaccine 34+ Years old  Completed   Hepatitis C Screening  Completed   Zoster Vaccines- Shingrix  Completed   HPV VACCINES  Aged Out   Meningococcal B Vaccine  Aged Out    Physical Exam: Vitals:   12/04/23 0826  BP: (!) 144/80  Pulse: 75  Resp: (!) 21  Temp: (!) 97.2 F (36.2 C)  SpO2: 98%  Weight: 185 lb (83.9 kg)  Height: 5\' 2"  (1.575 m)   Body mass index is 33.84 kg/m. Physical Exam Constitutional:      Appearance: Normal appearance.  HENT:     Head: Normocephalic and atraumatic.     Nose: Nose normal.     Mouth/Throat:     Mouth: Mucous membranes are moist.  Eyes:     Conjunctiva/sclera: Conjunctivae normal.  Cardiovascular:     Rate and Rhythm: Normal rate and regular rhythm.  Pulmonary:     Effort: Pulmonary effort is normal.     Breath sounds: Normal breath sounds.  Abdominal:     General: Bowel sounds are normal.     Palpations: Abdomen is soft.  Musculoskeletal:        General: Normal range of motion.     Cervical back: Normal range of motion.  Skin:    General: Skin is warm and dry.  Neurological:     General: No focal deficit present.     Mental Status: She is alert and oriented to person, place, and time.  Psychiatric:        Mood and Affect: Mood normal.        Behavior: Behavior normal.        Thought Content: Thought content normal.        Judgment: Judgment normal.     Labs reviewed: Basic Metabolic Panel: Recent Labs    05/22/23 1059 11/11/23 0953  NA  --  141  K  --  4.2  CL  --  105  CO2  --  28  GLUCOSE  --  91  BUN  --  13  CREATININE  --  0.65  CALCIUM   --  9.4  TSH 2.85 3.60   Liver Function Tests: Recent Labs    04/15/23 1029 08/19/23 1002 11/11/23 0953  AST 15 19 17   ALT 14 16 14   ALKPHOS 115 129*  --   BILITOT 0.3 0.4 0.3  PROT 6.1  6.0 5.8*  ALBUMIN 4.3 3.9  --    No results for input(s): "LIPASE", "AMYLASE" in the last 8760 hours. No results for input(s): "AMMONIA" in the last 8760 hours. CBC: Recent Labs    05/22/23 1059 11/11/23 0953  WBC 5.5 3.3*  NEUTROABS 3,982 2,142  HGB 13.9 13.6  HCT 42.5 41.3  MCV 93.0 89.8  PLT 248 224   Lipid Panel: Recent Labs    04/15/23 1029 08/19/23 1002  CHOL 211* 181  HDL 56 49  LDLCALC 133* 110*  TRIG 124 123  CHOLHDL 3.8 3.7   Lab Results  Component Value Date   HGBA1C 5.4 06/15/2021    Procedures since last visit: No results found.  Assessment/Plan  1. Acute bronchitis, unspecified organism (Primary) -  Acute bronchitis with cough, wheezing, and chest tightness. Negative for strep throat. Pneumonia considered but chest x-ray deemed unnecessary. - Prescribed doxycycline for 10 days with food and a probiotic. - Prescribed Tessalon Perles for cough. - Prescribed albuterol inhaler, 2 puffs every 6 hours as needed. - Advised on potential side effects of albuterol, including increased heart rate. - Advised on avoiding sun exposure while on doxycycline. - Advised to return if symptoms persist post-antibiotics. - benzonatate (TESSALON PERLES) 100 MG capsule; Take 1 capsule (100 mg total) by mouth 3 (three) times daily as needed.  Dispense: 20 capsule; Refill: 0 - doxycycline (VIBRA-TABS) 100 MG tablet; Take 1 tablet (100 mg total) by mouth 2 (two) times daily for 10 days.  Dispense: 20 tablet; Refill: 0 - albuterol (VENTOLIN HFA) 108 (90 Base) MCG/ACT inhaler; Inhale 2 puffs into the lungs every 6 (six) hours as needed for wheezing or shortness of breath.  Dispense: 8 g; Refill: 0 Result was negative  2. Essential hypertension -  Hypertension managed with hydralazine , carvedilol , and losartan . Current blood pressure 144/80 mmHg, slightly elevated.  3. Mixed hyperlipidemia Lab Results  Component Value Date   CHOL 181 08/19/2023   HDL 49 08/19/2023   LDLCALC  110 (H) 08/19/2023   LDLDIRECT 130.2 11/03/2011   TRIG 123 08/19/2023   CHOLHDL 3.7 08/19/2023    - Hyperlipidemia managed with Zetia .  - Continue Zetia  10 mg daily.  4. Seasonal allergic rhinitis -  Seasonal allergic rhinitis with vertigo. No nasal congestion. - Continue fexofenadine 180 mg daily. - Continue Flonase as needed.        Labs/tests ordered:   POCT rapid strep A  Return if symptoms worsen or fail to improve.  Corneisha Alvi Medina-Vargas, NP

## 2023-12-04 NOTE — Telephone Encounter (Signed)
 Message routed to [place], NP. Patient is on your schedule for today.

## 2023-12-04 NOTE — Telephone Encounter (Signed)
 Noted.

## 2023-12-04 NOTE — Telephone Encounter (Signed)
Patient was seen today. FYI.

## 2023-12-07 ENCOUNTER — Ambulatory Visit: Payer: Self-pay | Admitting: Obstetrics

## 2023-12-17 ENCOUNTER — Ambulatory Visit: Admitting: Obstetrics

## 2023-12-18 ENCOUNTER — Ambulatory Visit (HOSPITAL_BASED_OUTPATIENT_CLINIC_OR_DEPARTMENT_OTHER): Admitting: Physical Therapy

## 2023-12-18 ENCOUNTER — Encounter (HOSPITAL_BASED_OUTPATIENT_CLINIC_OR_DEPARTMENT_OTHER): Payer: Self-pay | Admitting: Physical Therapy

## 2023-12-18 DIAGNOSIS — R262 Difficulty in walking, not elsewhere classified: Secondary | ICD-10-CM

## 2023-12-18 DIAGNOSIS — M6281 Muscle weakness (generalized): Secondary | ICD-10-CM

## 2023-12-18 DIAGNOSIS — M5459 Other low back pain: Secondary | ICD-10-CM

## 2023-12-18 DIAGNOSIS — R252 Cramp and spasm: Secondary | ICD-10-CM

## 2023-12-18 NOTE — Therapy (Signed)
 OUTPATIENT PHYSICAL THERAPY THORACOLUMBAR Treatment/Progress note    Patient Name: Dawn [name] MRN: [id number] DOB:1946-06-08, 78 y.o., female Today's Date: 12/18/2023  END OF SESSION:  PT End of Session - 12/18/23 1332     Visit Number 57    Number of Visits 59    Date for PT Re-Evaluation 02/26/24    Authorization Type Progress note done at 57    PT Start Time 1017    PT Stop Time 1058    PT Time Calculation (min) 41 min    Activity Tolerance Patient tolerated treatment well;No increased pain    Behavior During Therapy [facility] for tasks assessed/performed                          Past Medical History:  Diagnosis Date   Allergy    CAD (coronary artery disease)    s/p DES to RCA in 06/2021   Dry eyes    Endometrial polyp    Essential hypertension 08/25/2018   GERD (gastroesophageal reflux disease)    History of hiatal hernia    History of kidney stones    Hot flashes, menopausal 10/13/2011   Estradiol  started    Hyperlipidemia    Jaundice as teenager   no problems since   Memory loss 09/21/2019     1/2 of feet numb all the time   Multiple sclerosis (HCC) dx 2001   Neuropathy    bilateral feet   Osteoarthritis    Sjogren's syndrome (HCC) dx oct 2021   sore muscvles, dry mouth and eyes   Stroke (HCC)    Vertigo    Vision abnormalities    Past Surgical History:  Procedure Laterality Date   ABDOMINAL HYSTERECTOMY  2002   partial   COLONOSCOPY  01/27/2022   2 day prep   colonscopy  2011   CORONARY STENT INTERVENTION N/A 07/18/2021   Procedure: CORONARY STENT INTERVENTION;  Surgeon: Lucendia Rusk, MD;  Location: [facility];  Service: Cardiovascular;  Laterality: N/A;   DILATATION & CURETTAGE/HYSTEROSCOPY WITH MYOSURE N/A 06/08/2020   Procedure: DILATATION & CURETTAGE/HYSTEROSCOPY/Polypectomy WITH MYOSURE;  Surgeon: Olin Bertin, DO;  Location: [facility];  Service: Gynecology;  Laterality: N/A;   LEFT HEART  CATH AND CORONARY ANGIOGRAPHY N/A 07/18/2021   Procedure: LEFT HEART CATH AND CORONARY ANGIOGRAPHY;  Surgeon: Lucendia Rusk, MD;  Location: [facility] CV [facility];  Service: Cardiovascular;  Laterality: N/A;   LUMBAR FUSION  2001   TOTAL HIP ARTHROPLASTY Right 01/17/2021   Procedure: TOTAL HIP ARTHROPLASTY ANTERIOR APPROACH;  Surgeon: Adonica Hoose, MD;  Location: [facility];  Service: Orthopedics;  Laterality: Right;   UPPER GI ENDOSCOPY  yrs ago   Patient Active Problem List   Diagnosis Date Noted   History of gross hematuria 11/02/2023   Constipation 11/02/2023   Chronic diastolic congestive heart failure (HCC) 11/02/2023   UTI (urinary tract infection) 07/09/2023   OAB (overactive bladder) 07/03/2023   Statin myopathy 06/26/2023   History of CVA (cerebrovascular accident) 06/26/2023   Rheumatoid arthritis involving right hand, unspecified whether rheumatoid factor present (HCC) 05/22/2023   Atherosclerotic heart disease of native coronary artery with other forms of angina pectoris (HCC) 05/22/2023   Positive colorectal cancer screening using Cologuard test 11/14/2021   Coronary artery disease    Statin intolerance 04/19/2021   Osteoarthritis of right hip 01/17/2021   Status post THR (total hip replacement) 01/17/2021   Chronic bilateral low back pain  without sciatica 10/02/2020   Chronic pain syndrome 06/20/2020   Post laminectomy syndrome 06/20/2020   Sjogren's disease (HCC) 05/23/2020   Primary osteoarthritis of both hands 05/23/2020   Primary osteoarthritis of both feet 05/23/2020   Sjogren's syndrome (HCC) 05/23/2020   Other secondary scoliosis, lumbar region 12/22/2019   Spondylosis without myelopathy or radiculopathy, lumbar region 12/22/2019   Cervical radiculopathy 10/18/2019   Numbness 09/21/2019   Bilateral carpal tunnel syndrome 09/21/2019   High risk medication use 09/21/2019   Memory loss 09/21/2019   Essential hypertension 08/25/2018   Abnormal SPEP 02/19/2018    Hand pain 02/20/2017   Multiple joint pain 02/18/2017   Disturbed cognition 06/25/2016   Impaired cognition 06/25/2016   Sciatica, right side 10/30/2015   Chronic fatigue 08/04/2014   Gait disturbance 08/04/2014   Unspecified visual disturbance 01/31/2013   Transient vision disturbance 01/31/2013   Colon polyps 11/03/2011   POSTHERPETIC NEURALGIA 10/03/2009   DISPLCMT LUMBAR INTERVERT DISC W/O MYELOPATHY 09/05/2009   RENAL CALCULUS, RECURRENT 09/04/2009   Hyperlipidemia 08/31/2009   Multiple sclerosis (HCC) 08/31/2009   ALLERGIC RHINITIS 08/31/2009  Progress Note Reporting Period 10/02/2023 to 12/18/2023  See note below for Objective Data and Assessment of Progress/Goals.     PCP: Jenetta Misty NP  REFERRING PROVIDER: Dr Christophe Cram   REFERRING DIAG: Low Back Pain   Rationale for Evaluation and Treatment: Rehabilitation  THERAPY DIAG:  Difficulty in walking, not elsewhere classified  Other low back pain  Muscle weakness (generalized)  Cramp and spasm  ONSET DATE: About 11 days ago her nerve abiliation wore off.   SUBJECTIVE:                                                                                                                                                                                           SUBJECTIVE STATEMENT: Patient reports she had injections last week in both hips.  She reports this helped her right hip significantly.  She sends pain in her left hip today.  She is not sure why. PERTINENT HISTORY:  Right Hip replacement 2022, Low back Pain, MS, TIA October 2023; MI, November 2022; CAD, Sjorgens syndrome  PAIN:  Are you having pain? Yes: NPRS scale: 6/10 Pain location left side lumbar spine Pain description: aching  Aggravating factors: standing and walking  Relieving factors: rest    PRECAUTIONS: None  WEIGHT BEARING RESTRICTIONS: No  FALLS:  Has patient fallen in last 6 months? 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   LUMBAR ROM:   AROM eval 5/15 10/3  Flexion Limited 50 % with pain  Mild improvement but still about 50%  Improved pain but still limited  Extension No limit     Right lateral flexion     Left lateral flexion     Right rotation Limited 50% with pain     Left rotation Limited 50% with pain      (Blank rows = not tested)  LOWER EXTREMITY ROM:     Passive  Right eval Left eval Right  Right  7/24 Right 1/2  Hip flexion 105 90 105 110 with pain at end range  100 with pain   Hip extension   10 degrees from neutral before manual today. Too neutral after stretching     Hip abduction       Hip adduction       Hip internal rotation painful Painful  Painful  Full with less pain  Painful   Hip external rotation   20 degrees with pain  45 with pain  48 with pain   Knee flexion       Knee extension       Ankle dorsiflexion       Ankle plantarflexion       Ankle inversion       Ankle eversion        (Blank rows = not tested)  LOWER EXTREMITY MMT:    MMT Right  10/2 Left 10/2 right 1/2 left 1/2 Right  3/7 Left 3/7  Right  5/30 Left  5/30  Hip flexion 15.3 23.4 16.7 23.4 17.5 20.4 14.8 17.4  Hip extension          Hip abduction 23.8 28.5 25.6 27.9 26.6 20.9 22.6 16.9  Hip adduction          Hip internal rotation           Hip external rotation          Knee flexion          Knee extension 23.8 23.6 22.6 24.8 28.8 20.7 23.8 17.3  Ankle dorsiflexion          Ankle plantarflexion          Ankle inversion          Ankle eversion           (Blank rows = not tested)  FUNCTIONAL TESTS:  5 times sit to stand: 28 sec  5/15 5 times sit to stand test 20 seconds 7/25 24 sec  10/16 17 sec 1/2 17 seconds 3/14 18 sec   3-minute walk test 150 feet without requirement of seated rest break 5/15  7/25 3 min walk test 200'   10/16 3 min walk test 379'   3/14  5/30 5x sit to stand 23 sec  3 min walk test 268 with fatigue and left hip pain   GAIT:  TODAY'S TREATMENT:  DATE:  5/30 Manual:  Roller to left hip.   Trigger point release to the anterior hip.  Trigger point release to left lateral hip. LAD to left leg grade II and III   There-ex: ball roll out 10x 5 sec hold   Strength testing     Gait:  3 min walk test   There-act:   5x sit to stand     5/14 Manual: Roller to left hip.  Trigger point release to the anterior hip.  Trigger point release to left lateral hip.  There-ex: Nustep warm up lvl 3 LAQ 3x10 red  Hip abduction 3x15 red  Seated march 3 x 10 Bilateral ER red band 3 x 10 Horizontal abduction red 3 x 10   4/29 Manual: Therapy performed manual therapy to lumbar spine and lateral hip using a roller and trigger point release  There-ex: Nustep warm up lvl 3 LAQ 2x10 red  Hip abduction 3x15 red   Neuro-re-ed Bilateral ER yellow 3x10  Flexion with wand 3x10 with posture    PATIENT EDUCATION:  Education details: reviewed HEP, symptom management  Person educated: Patient Education method: Explanation, Demonstration, Tactile cues, Verbal cues, and Handouts Education comprehension: verbalized understanding, returned  demonstration, verbal cues required, tactile cues required, and needs further education  HOME EXERCISE PROGRAM: Has previous HEP. Will review next visit  ASSESSMENT:  CLINICAL IMPRESSION:  Therapy performed progress note on patient today. She has had a recent respiratory infection and has a significant onset of left hip pain. These have effected her progression this progress note. She had a significant improvement in ability to ambulate following therapy today. We hope over the next few weeks to get her back into an exercises program. She would benefit from skilled therapy 1W10. See below for goal specific progress    OBJECTIVE IMPAIRMENTS: Abnormal gait, decreased activity tolerance, decreased balance, decreased endurance, decreased knowledge of use of DME, decreased mobility, difficulty walking, decreased ROM, decreased strength, increased muscle spasms, and pain.   ACTIVITY LIMITATIONS: carrying, lifting, bending, standing, squatting, sleeping, stairs, transfers, and locomotion level  PARTICIPATION LIMITATIONS: meal prep, cleaning, laundry, driving, shopping, community activity, and yard work  PERSONAL FACTORS: Right Hip replacement 2022, Low back Pain, MS, TIA October 2023; MI, November 2022; CAD, Sjorgens syndrome are also affecting patient's functional outcome. MS  REHAB POTENTIAL: Good  CLINICAL DECISION MAKING: Evolving/moderate complexity declining mobility   EVALUATION COMPLEXITY: Moderate   GOALS: Goals reviewed with patient? No  SHORT TERM GOALS: Target date: 09/29/2022    Patient will increase gross bilateral lower extremity strength by 5 pounds Baseline: Goal status: has lost strength 2nd to medical difficulties 5/30  2.  Patient will increase lumbar flexion by 10 degrees Baseline:  Goal status:  not tested today 2nd to pain 5/30   3.  Patient will be independent with basic after exercise program and stretching program Baseline:  Goal status: working her back  towards exercises    LONG TERM GOALS: Target date: 09/29/2022     Patient will stand for greater than 30 minutes without increased pain Baseline:  Goal status: limited by left hip pain today 5/30  2.  Patient will pick item up off the floor without increased low back pain Baseline:  Goal status:met back improved 4/29    3.  Patient will ambulate community distances without report of increased low back pain Baseline:  Goal status:limited by pain but maintaining 7/25  4.  Patient will report improved ability to  sleep through the night secondary to back pain Baseline:  Goal status: no pain 5/30    PLAN:  PT FREQUENCY: 1x/week  PT DURATION: 10 weeks   PLANNED INTERVENTIONS: Therapeutic exercises, Therapeutic activity, Neuromuscular re-education, Balance training, Gait training, Patient/Family education, Self Care, Joint mobilization, Stair training, DME instructions, Aquatic Therapy, Dry Needling, Spinal mobilization, Cryotherapy, Moist heat, Ionotophoresis 4mg /ml Dexamethasone , and Manual therapy.  PLAN FOR NEXT SESSION: Consider manual therapy to the back and hips, consider trigger point dry needling to back and hips.  Reviewed basic HEP.  Begin active strengthening of hips and core.  Begin gait training and functional mobility training. Progress back into exercises.        Kitty Perkins, PT 10/28/2022, 1:33 PM   I

## 2023-12-23 ENCOUNTER — Ambulatory Visit (HOSPITAL_BASED_OUTPATIENT_CLINIC_OR_DEPARTMENT_OTHER): Attending: Physical Medicine and Rehabilitation | Admitting: Physical Therapy

## 2023-12-23 DIAGNOSIS — M6281 Muscle weakness (generalized): Secondary | ICD-10-CM | POA: Insufficient documentation

## 2023-12-23 DIAGNOSIS — R262 Difficulty in walking, not elsewhere classified: Secondary | ICD-10-CM | POA: Diagnosis not present

## 2023-12-23 DIAGNOSIS — M5459 Other low back pain: Secondary | ICD-10-CM | POA: Diagnosis not present

## 2023-12-23 NOTE — Therapy (Unsigned)
 OUTPATIENT PHYSICAL THERAPY THORACOLUMBAR Treatment/Progress note    Patient Name: RUBYLEE ZAMARRIPA MRN: [id number] DOB:Oct 24, 1945, 78 y.o., female Today's Date: 12/24/2023  END OF SESSION:                 Past Medical History:  Diagnosis Date   Allergy    CAD (coronary artery disease)    s/p DES to RCA in 06/2021   Dry eyes    Endometrial polyp    Essential hypertension 08/25/2018   GERD (gastroesophageal reflux disease)    History of hiatal hernia    History of kidney stones    Hot flashes, menopausal 10/13/2011   Estradiol  started    Hyperlipidemia    Jaundice as teenager   no problems since   Memory loss 09/21/2019     1/2 of feet numb all the time   Multiple sclerosis (HCC) dx 2001   Neuropathy    bilateral feet   Osteoarthritis    Sjogren's syndrome (HCC) dx oct 2021   sore muscvles, dry mouth and eyes   Stroke (HCC)    Vertigo    Vision abnormalities    Past Surgical History:  Procedure Laterality Date   ABDOMINAL HYSTERECTOMY  2002   partial   COLONOSCOPY  01/27/2022   2 day prep   colonscopy  2011   CORONARY STENT INTERVENTION N/A 07/18/2021   Procedure: CORONARY STENT INTERVENTION;  Surgeon: Lucendia Rusk, MD;  Location: [facility];  Service: Cardiovascular;  Laterality: N/A;   DILATATION & CURETTAGE/HYSTEROSCOPY WITH MYOSURE N/A 06/08/2020   Procedure: DILATATION & CURETTAGE/HYSTEROSCOPY/Polypectomy WITH MYOSURE;  Surgeon: Olin Bertin, DO;  Location: [facility];  Service: Gynecology;  Laterality: N/A;   LEFT HEART CATH AND CORONARY ANGIOGRAPHY N/A 07/18/2021   Procedure: LEFT HEART CATH AND CORONARY ANGIOGRAPHY;  Surgeon: Lucendia Rusk, MD;  Location: [facility];  Service: Cardiovascular;  Laterality: N/A;   LUMBAR FUSION  2001   TOTAL HIP ARTHROPLASTY Right 01/17/2021   Procedure: TOTAL HIP ARTHROPLASTY ANTERIOR APPROACH;  Surgeon: Adonica Hoose, MD;  Location: [facility];  Service:  Orthopedics;  Laterality: Right;   UPPER GI ENDOSCOPY  yrs ago   Patient Active Problem List   Diagnosis Date Noted   History of gross hematuria 11/02/2023   Constipation 11/02/2023   Chronic diastolic congestive heart failure (HCC) 11/02/2023   UTI (urinary tract infection) 07/09/2023   OAB (overactive bladder) 07/03/2023   Statin myopathy 06/26/2023   History of CVA (cerebrovascular accident) 06/26/2023   Rheumatoid arthritis involving right hand, unspecified whether rheumatoid factor present (HCC) 05/22/2023   Atherosclerotic heart disease of native coronary artery with other forms of angina pectoris (HCC) 05/22/2023   Positive colorectal cancer screening using Cologuard test 11/14/2021   Coronary artery disease    Statin intolerance 04/19/2021   Osteoarthritis of right hip 01/17/2021   Status post THR (total hip replacement) 01/17/2021   Chronic bilateral low back pain without sciatica 10/02/2020   Chronic pain syndrome 06/20/2020   Post laminectomy syndrome 06/20/2020   Sjogren's disease (HCC) 05/23/2020   Primary osteoarthritis of both hands 05/23/2020   Primary osteoarthritis of both feet 05/23/2020   Sjogren's syndrome (HCC) 05/23/2020   Other secondary scoliosis, lumbar region 12/22/2019   Spondylosis without myelopathy or radiculopathy, lumbar region 12/22/2019   Cervical radiculopathy 10/18/2019   Numbness 09/21/2019   Bilateral carpal tunnel syndrome 09/21/2019   High risk medication use 09/21/2019   Memory loss 09/21/2019   Essential hypertension  08/25/2018   Abnormal SPEP 02/19/2018   Hand pain 02/20/2017   Multiple joint pain 02/18/2017   Disturbed cognition 06/25/2016   Impaired cognition 06/25/2016   Sciatica, right side 10/30/2015   Chronic fatigue 08/04/2014   Gait disturbance 08/04/2014   Unspecified visual disturbance 01/31/2013   Transient vision disturbance 01/31/2013   Colon polyps 11/03/2011   POSTHERPETIC NEURALGIA 10/03/2009   DISPLCMT LUMBAR  INTERVERT DISC W/O MYELOPATHY 09/05/2009   RENAL CALCULUS, RECURRENT 09/04/2009   Hyperlipidemia 08/31/2009   Multiple sclerosis (HCC) 08/31/2009   ALLERGIC RHINITIS 08/31/2009  Progress Note Reporting Period 10/02/2023 to 12/18/2023  See note below for Objective Data and Assessment of Progress/Goals.     PCP: Jenetta Misty NP  REFERRING PROVIDER: Dr Christophe Cram   REFERRING DIAG: Low Back Pain   Rationale for Evaluation and Treatment: Rehabilitation  THERAPY DIAG:  No diagnosis found.  ONSET DATE: About 11 days ago her nerve abiliation wore off.   SUBJECTIVE:                                                                                                                                                                                           SUBJECTIVE STATEMENT: The left hip is better today.   PERTINENT HISTORY:  Right Hip replacement 2022, Low back Pain, MS, TIA October 2023; MI, November 2022; CAD, Sjorgens syndrome  PAIN:  Are you having pain? Yes: NPRS scale: 6/10 Pain location left side lumbar spine Pain description: aching  Aggravating factors: standing and walking  Relieving factors: rest    PRECAUTIONS: None  WEIGHT BEARING RESTRICTIONS: No  FALLS:  Has patient fallen in last 6 months? 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   LUMBAR ROM:   AROM eval 5/15 10/3  Flexion Limited 50 % with pain  Mild improvement but still about 50%  Improved pain but still limited  Extension No limit     Right lateral flexion  Left lateral flexion     Right rotation Limited 50% with pain     Left rotation Limited 50% with pain      (Blank rows = not tested)  LOWER EXTREMITY ROM:     Passive  Right eval Left eval Right  Right  7/24 Right 1/2  Hip flexion 105 90 105 110 with pain at end range  100 with pain   Hip extension   10 degrees from neutral before manual today. Too neutral after stretching     Hip abduction       Hip adduction       Hip internal rotation painful Painful  Painful  Full with less pain  Painful   Hip external rotation   20 degrees with pain  45 with pain  48 with pain   Knee flexion       Knee extension       Ankle dorsiflexion       Ankle plantarflexion       Ankle inversion       Ankle eversion        (Blank rows = not tested)  LOWER EXTREMITY MMT:    MMT Right  10/2 Left 10/2 right 1/2 left 1/2 Right  3/7 Left 3/7  Right  5/30 Left  5/30  Hip flexion 15.3 23.4 16.7 23.4 17.5 20.4 14.8 17.4  Hip extension          Hip abduction 23.8 28.5 25.6 27.9 26.6 20.9 22.6 16.9  Hip adduction          Hip internal rotation          Hip external rotation          Knee flexion          Knee extension 23.8 23.6 22.6 24.8 28.8 20.7 23.8 17.3  Ankle dorsiflexion          Ankle plantarflexion          Ankle inversion          Ankle eversion           (Blank rows = not tested)  FUNCTIONAL TESTS:  5 times sit to stand: 28 sec  5/15 5 times sit to stand test 20 seconds 7/25 24 sec  10/16 17 sec 1/2 17 seconds 3/14 18 sec   3-minute walk test 150 feet without requirement of seated rest break 5/15  7/25 3 min walk test 200'   10/16 3 min walk test 379'   3/14  5/30 5x sit to stand 23 sec  3 min walk test 268 with fatigue  and left hip pain   GAIT:  TODAY'S TREATMENT:                                                                                                                              DATE:  6/5 Manual:  Roller to left hip.   Trigger point release to the  anterior hip.  Trigger point release to left lateral hip. LAD to left leg grade II and III   There-ex: LTR 2x15  Supine bridge 3x10   Neuro-re-ed:  Cueing for breathing and to ist stright   Hip abduction 3x10 red Laq 3x10 red     5/30 Manual:  Roller to left hip.   Trigger point release to the anterior hip.  Trigger point release to left lateral hip. LAD to left leg grade II and III   There-ex: ball roll out 10x 5 sec hold   Strength testing     Gait:  3 min walk test   There-act:   5x sit to stand     5/14 Manual: Roller to left hip.  Trigger point release to the anterior hip.  Trigger point release to left lateral hip.  There-ex: Nustep warm up lvl 3 LAQ 3x10 red  Hip abduction 3x15 red  Seated march 3 x 10 Bilateral ER red band 3 x 10 Horizontal abduction red 3 x 10   4/29 Manual: Therapy performed manual therapy to lumbar spine and lateral hip using a roller and trigger point release  There-ex: Nustep warm up lvl 3 LAQ 2x10 red  Hip abduction 3x15 red   Neuro-re-ed Bilateral ER yellow 3x10  Flexion with wand 3x10 with posture    PATIENT EDUCATION:  Education details: reviewed HEP, symptom management  Person educated: Patient Education method: Explanation, Demonstration, Tactile cues, Verbal cues, and Handouts Education comprehension: verbalized understanding, returned demonstration, verbal cues required, tactile cues required, and needs further education  HOME EXERCISE PROGRAM: Has previous HEP. Will review next visit  ASSESSMENT:  CLINICAL IMPRESSION:  The patient did better today. Her trigger points were less tender. We were able to work her back into a base exercise  program. Following therapy she was able to stand straighter with less pain in weight bearing. She has been walking more at home.  OBJECTIVE IMPAIRMENTS: Abnormal gait, decreased activity tolerance, decreased balance, decreased endurance, decreased knowledge of use of DME, decreased mobility, difficulty walking, decreased ROM, decreased strength, increased muscle spasms, and pain.   ACTIVITY LIMITATIONS: carrying, lifting, bending, standing, squatting, sleeping, stairs, transfers, and locomotion level  PARTICIPATION LIMITATIONS: meal prep, cleaning, laundry, driving, shopping, community activity, and yard work  PERSONAL FACTORS: Right Hip replacement 2022, Low back Pain, MS, TIA October 2023; MI, November 2022; CAD, Sjorgens syndrome are also affecting patient's functional outcome. MS  REHAB POTENTIAL: Good  CLINICAL DECISION MAKING: Evolving/moderate complexity declining mobility   EVALUATION COMPLEXITY: Moderate   GOALS: Goals reviewed with patient? No  SHORT TERM GOALS: Target date: 09/29/2022    Patient will increase gross bilateral lower extremity strength by 5 pounds Baseline: Goal status: has lost strength 2nd to medical difficulties 5/30  2.  Patient will increase lumbar flexion by 10 degrees Baseline:  Goal status:  not tested today 2nd to pain 5/30   3.  Patient will be independent with basic after exercise program and stretching program Baseline:  Goal status: working her back towards exercises    LONG TERM GOALS: Target date: 09/29/2022     Patient will stand for greater than 30 minutes without increased pain Baseline:  Goal status: limited by left hip pain today 5/30  2.  Patient will pick item up off the floor without increased low back pain Baseline:  Goal status:met back improved 4/29    3.  Patient will ambulate community distances without report of increased low back  pain Baseline:  Goal status:limited by pain but maintaining 7/25  4.  Patient will  report improved ability to sleep through the night secondary to back pain Baseline:  Goal status: no pain 5/30    PLAN:  PT FREQUENCY: 1x/week  PT DURATION: 10 weeks   PLANNED INTERVENTIONS: Therapeutic exercises, Therapeutic activity, Neuromuscular re-education, Balance training, Gait training, Patient/Family education, Self Care, Joint mobilization, Stair training, DME instructions, Aquatic Therapy, Dry Needling, Spinal mobilization, Cryotherapy, Moist heat, Ionotophoresis 4mg /ml Dexamethasone , and Manual therapy.  PLAN FOR NEXT SESSION: Consider manual therapy to the back and hips, consider trigger point dry needling to back and hips.  Reviewed basic HEP.  Begin active strengthening of hips and core.  Begin gait training and functional mobility training. Progress back into exercises.        Kitty Perkins, PT 10/28/2022, 8:22 AM   I

## 2023-12-24 ENCOUNTER — Encounter (HOSPITAL_BASED_OUTPATIENT_CLINIC_OR_DEPARTMENT_OTHER): Payer: Self-pay | Admitting: Physical Therapy

## 2023-12-24 DIAGNOSIS — M25552 Pain in left hip: Secondary | ICD-10-CM | POA: Diagnosis not present

## 2023-12-24 DIAGNOSIS — M25551 Pain in right hip: Secondary | ICD-10-CM | POA: Diagnosis not present

## 2023-12-24 DIAGNOSIS — M545 Low back pain, unspecified: Secondary | ICD-10-CM | POA: Diagnosis not present

## 2023-12-30 ENCOUNTER — Ambulatory Visit (HOSPITAL_BASED_OUTPATIENT_CLINIC_OR_DEPARTMENT_OTHER): Admitting: Physical Therapy

## 2023-12-30 ENCOUNTER — Encounter (HOSPITAL_BASED_OUTPATIENT_CLINIC_OR_DEPARTMENT_OTHER): Payer: Self-pay | Admitting: Physical Therapy

## 2023-12-30 DIAGNOSIS — R262 Difficulty in walking, not elsewhere classified: Secondary | ICD-10-CM

## 2023-12-30 DIAGNOSIS — M6281 Muscle weakness (generalized): Secondary | ICD-10-CM | POA: Diagnosis not present

## 2023-12-30 DIAGNOSIS — M5459 Other low back pain: Secondary | ICD-10-CM | POA: Diagnosis not present

## 2023-12-30 NOTE — Therapy (Signed)
 OUTPATIENT PHYSICAL THERAPY THORACOLUMBAR Treatment/Progress note    Patient Name: Dawn [name] MRN: [id number] DOB:14-Sep-1945, 78 y.o., female Today's Date: 12/30/2023  END OF SESSION:  PT End of Session - 12/30/23 1113     Visit Number 58    Number of Visits 66    Date for PT Re-Evaluation 02/26/24    Authorization Type Progress note done at 57    PT Start Time 1100    PT Stop Time 1143    PT Time Calculation (min) 43 min    Activity Tolerance Patient tolerated treatment well;No increased pain    Behavior During Therapy [facility] for tasks assessed/performed                           Past Medical History:  Diagnosis Date   Allergy    CAD (coronary artery disease)    s/p DES to RCA in 06/2021   Dry eyes    Endometrial polyp    Essential hypertension 08/25/2018   GERD (gastroesophageal reflux disease)    History of hiatal hernia    History of kidney stones    Hot flashes, menopausal 10/13/2011   Estradiol  started    Hyperlipidemia    Jaundice as teenager   no problems since   Memory loss 09/21/2019     1/2 of feet numb all the time   Multiple sclerosis (HCC) dx 2001   Neuropathy    bilateral feet   Osteoarthritis    Sjogren's syndrome (HCC) dx oct 2021   sore muscvles, dry mouth and eyes   Stroke (HCC)    Vertigo    Vision abnormalities    Past Surgical History:  Procedure Laterality Date   ABDOMINAL HYSTERECTOMY  2002   partial   COLONOSCOPY  01/27/2022   2 day prep   colonscopy  2011   CORONARY STENT INTERVENTION N/A 07/18/2021   Procedure: CORONARY STENT INTERVENTION;  Surgeon: Lucendia Rusk, MD;  Location: [facility] INVASIVE [facility];  Service: Cardiovascular;  Laterality: N/A;   DILATATION & CURETTAGE/HYSTEROSCOPY WITH MYOSURE N/A 06/08/2020   Procedure: DILATATION & CURETTAGE/HYSTEROSCOPY/Polypectomy WITH MYOSURE;  Surgeon: Olin Bertin, DO;  Location: [facility];  Service: Gynecology;  Laterality: N/A;   LEFT HEART  CATH AND CORONARY ANGIOGRAPHY N/A 07/18/2021   Procedure: LEFT HEART CATH AND CORONARY ANGIOGRAPHY;  Surgeon: Lucendia Rusk, MD;  Location: [facility] CV LAB;  Service: Cardiovascular;  Laterality: N/A;   LUMBAR FUSION  2001   TOTAL HIP ARTHROPLASTY Right 01/17/2021   Procedure: TOTAL HIP ARTHROPLASTY ANTERIOR APPROACH;  Surgeon: Adonica Hoose, MD;  Location: [facility];  Service: Orthopedics;  Laterality: Right;   UPPER GI ENDOSCOPY  yrs ago   Patient Active Problem List   Diagnosis Date Noted   History of gross hematuria 11/02/2023   Constipation 11/02/2023   Chronic diastolic congestive heart failure (HCC) 11/02/2023   UTI (urinary tract infection) 07/09/2023   OAB (overactive bladder) 07/03/2023   Statin myopathy 06/26/2023   History of CVA (cerebrovascular accident) 06/26/2023   Rheumatoid arthritis involving right hand, unspecified whether rheumatoid factor present (HCC) 05/22/2023   Atherosclerotic heart disease of native coronary artery with other forms of angina pectoris (HCC) 05/22/2023   Positive colorectal cancer screening using Cologuard test 11/14/2021   Coronary artery disease    Statin intolerance 04/19/2021   Osteoarthritis of right hip 01/17/2021   Status post THR (total hip replacement) 01/17/2021   Chronic bilateral low back  pain without sciatica 10/02/2020   Chronic pain syndrome 06/20/2020   Post laminectomy syndrome 06/20/2020   Sjogren's disease (HCC) 05/23/2020   Primary osteoarthritis of both hands 05/23/2020   Primary osteoarthritis of both feet 05/23/2020   Sjogren's syndrome (HCC) 05/23/2020   Other secondary scoliosis, lumbar region 12/22/2019   Spondylosis without myelopathy or radiculopathy, lumbar region 12/22/2019   Cervical radiculopathy 10/18/2019   Numbness 09/21/2019   Bilateral carpal tunnel syndrome 09/21/2019   High risk medication use 09/21/2019   Memory loss 09/21/2019   Essential hypertension 08/25/2018   Abnormal SPEP 02/19/2018    Hand pain 02/20/2017   Multiple joint pain 02/18/2017   Disturbed cognition 06/25/2016   Impaired cognition 06/25/2016   Sciatica, right side 10/30/2015   Chronic fatigue 08/04/2014   Gait disturbance 08/04/2014   Unspecified visual disturbance 01/31/2013   Transient vision disturbance 01/31/2013   Colon polyps 11/03/2011   POSTHERPETIC NEURALGIA 10/03/2009   DISPLCMT LUMBAR INTERVERT DISC W/O MYELOPATHY 09/05/2009   RENAL CALCULUS, RECURRENT 09/04/2009   Hyperlipidemia 08/31/2009   Multiple sclerosis (HCC) 08/31/2009   ALLERGIC RHINITIS 08/31/2009  Progress Note Reporting Period 10/02/2023 to 12/18/2023  See note below for Objective Data and Assessment of Progress/Goals.     PCP: Jenetta Misty NP  REFERRING PROVIDER: Dr Christophe Cram   REFERRING DIAG: Low Back Pain   Rationale for Evaluation and Treatment: Rehabilitation  THERAPY DIAG:  Difficulty in walking, not elsewhere classified  Other low back pain  Muscle weakness (generalized)  ONSET DATE: About 11 days ago her nerve abiliation wore off.   SUBJECTIVE:                                                                                                                                                                                           SUBJECTIVE STATEMENT: The patient reports over the past week the pain in her left hip has been much better. She has been working on her exercises more at home. Seh is still having pain in the front of her hip but it is more tolerable   PERTINENT HISTORY:  Right Hip replacement 2022, Low back Pain, MS, TIA October 2023; MI, November 2022; CAD, Sjorgens syndrome  PAIN:  Are you having pain? Yes: NPRS scale: 3/10 Pain location left hip  Pain description: aching  Aggravating factors: standing and walking  Relieving factors: rest    PRECAUTIONS: None  WEIGHT BEARING RESTRICTIONS: No  FALLS:  Has patient fallen in last 6 months? 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   LUMBAR ROM:   AROM eval 5/15 10/3  Flexion Limited 50 % with pain  Mild improvement but still about 50%  Improved pain but still limited  Extension No limit     Right lateral flexion     Left lateral flexion     Right rotation Limited 50% with pain     Left rotation Limited 50% with pain      (Blank rows = not tested)  LOWER EXTREMITY ROM:     Passive  Right eval Left eval Right  Right  7/24 Right 1/2  Hip flexion 105 90 105 110 with pain at end range  100 with pain   Hip extension   10 degrees from neutral before manual today. Too neutral after stretching     Hip abduction       Hip adduction       Hip internal rotation painful Painful  Painful  Full with less pain  Painful   Hip external rotation   20 degrees with pain  45 with pain  48 with pain   Knee flexion       Knee extension       Ankle dorsiflexion       Ankle plantarflexion       Ankle inversion       Ankle eversion        (Blank rows = not tested)  LOWER EXTREMITY MMT:    MMT Right  10/2 Left 10/2 right 1/2 left 1/2 Right  3/7 Left 3/7  Right  5/30 Left  5/30  Hip flexion 15.3 23.4 16.7 23.4 17.5 20.4 14.8 17.4  Hip extension          Hip abduction 23.8 28.5 25.6 27.9 26.6 20.9 22.6 16.9  Hip adduction          Hip internal  rotation          Hip external rotation          Knee flexion          Knee extension 23.8 23.6 22.6 24.8 28.8 20.7 23.8 17.3  Ankle dorsiflexion          Ankle plantarflexion          Ankle inversion          Ankle eversion           (Blank rows = not tested)  FUNCTIONAL TESTS:  5 times sit to stand: 28 sec  5/15 5 times sit to stand test 20 seconds 7/25 24 sec  10/16 17 sec 1/2 17 seconds 3/14 18 sec   3-minute walk test 150 feet without requirement of seated rest break 5/15  7/25 3 min walk test 200'   10/16 3 min walk test 379'   3/14  5/30 5x sit to stand 23 sec  3 min walk test 268 with fatigue and left hip pain   GAIT:  TODAY'S TREATMENT:                                                                                                                              DATE:  6/11 Manual:  Roller to left hip.   Trigger point release to the anterior hip.  Trigger point release to left lateral hip. LAD to left leg grade II and III   There-ex: LTR 2x15  SAQ 3x12 each leg 2lbs   Neuro-re-ed:   Hip abduction 3x12 red Supine march 3x12 with abdominal bracing  Weight shifting 3x10   There-act:  Step up 2 inch 3x10     5/30 Manual:  Roller to left hip.   Trigger point release to the anterior hip.  Trigger point release to left lateral hip. LAD to left leg grade II and III   There-ex: ball roll out 10x 5 sec hold   Strength testing     Gait:  3 min walk test   There-act:   5x sit to stand     5/14 Manual: Roller to left hip.  Trigger point release to the anterior hip.  Trigger point release to left lateral hip.  There-ex: Nustep warm up lvl 3 LAQ 3x10 red  Hip abduction 3x15 red  Seated march 3 x 10 Bilateral ER red band 3 x 10 Horizontal abduction red 3 x 10   4/29 Manual: Therapy performed manual therapy to lumbar spine and lateral hip using a roller and trigger point release  There-ex: Nustep warm up lvl 3  LAQ 2x10 red  Hip abduction 3x15 red   Neuro-re-ed Bilateral ER yellow 3x10  Flexion with wand 3x10 with posture    PATIENT EDUCATION:  Education details: reviewed HEP, symptom management  Person educated: Patient Education method: Explanation, Demonstration, Tactile cues, Verbal cues, and Handouts Education comprehension: verbalized understanding, returned demonstration, verbal cues required, tactile cues required, and needs further education  HOME EXERCISE PROGRAM: Has previous HEP. Will review next visit  ASSESSMENT:  CLINICAL IMPRESSION: Therapy was able to progress the patient back to exercises and functional strengthening activity. She continues to have tightness at in her anterior hip but it improved significantly with manual therapy.   OBJECTIVE IMPAIRMENTS: Abnormal gait, decreased activity tolerance, decreased balance, decreased endurance, decreased knowledge of use of DME, decreased mobility, difficulty walking, decreased ROM, decreased strength, increased muscle spasms, and pain.   ACTIVITY LIMITATIONS: carrying, lifting, bending, standing, squatting, sleeping, stairs, transfers, and locomotion level  PARTICIPATION LIMITATIONS: meal prep, cleaning, laundry, driving, shopping, community activity, and yard work  PERSONAL FACTORS: Right Hip replacement 2022, Low back Pain, MS, TIA October 2023; MI, November 2022; CAD, Sjorgens syndrome are also affecting patient's functional outcome. MS  REHAB POTENTIAL: Good  CLINICAL DECISION MAKING: Evolving/moderate complexity declining mobility   EVALUATION COMPLEXITY: Moderate   GOALS: Goals reviewed with patient? No  SHORT TERM GOALS: Target date: 09/29/2022    Patient will increase gross  bilateral lower extremity strength by 5 pounds Baseline: Goal status: has lost strength 2nd to medical difficulties 5/30  2.  Patient will increase lumbar flexion by 10 degrees Baseline:  Goal status:  not tested today 2nd to  pain 5/30   3.  Patient will be independent with basic after exercise program and stretching program Baseline:  Goal status: working her back towards exercises    LONG TERM GOALS: Target date: 09/29/2022     Patient will stand for greater than 30 minutes without increased pain Baseline:  Goal status: limited by left hip pain today 5/30  2.  Patient will pick item up off the floor without increased low back pain Baseline:  Goal status:met back improved 4/29    3.  Patient will ambulate community distances without report of increased low back pain Baseline:  Goal status:limited by pain but maintaining 7/25  4.  Patient will report improved ability to sleep through the night secondary to back pain Baseline:  Goal status: no pain 5/30    PLAN:  PT FREQUENCY: 1x/week  PT DURATION: 10 weeks   PLANNED INTERVENTIONS: Therapeutic exercises, Therapeutic activity, Neuromuscular re-education, Balance training, Gait training, Patient/Family education, Self Care, Joint mobilization, Stair training, DME instructions, Aquatic Therapy, Dry Needling, Spinal mobilization, Cryotherapy, Moist heat, Ionotophoresis 4mg /ml Dexamethasone , and Manual therapy.  PLAN FOR NEXT SESSION: Consider manual therapy to the back and hips, consider trigger point dry needling to back and hips.  Reviewed basic HEP.  Begin active strengthening of hips and core.  Begin gait training and functional mobility training. Progress back into exercises.        Kitty Perkins, PT 10/28/2022, 11:19 AM   I

## 2024-01-05 ENCOUNTER — Ambulatory Visit (HOSPITAL_BASED_OUTPATIENT_CLINIC_OR_DEPARTMENT_OTHER): Admitting: Physical Therapy

## 2024-01-05 DIAGNOSIS — M5459 Other low back pain: Secondary | ICD-10-CM | POA: Diagnosis not present

## 2024-01-05 DIAGNOSIS — M6281 Muscle weakness (generalized): Secondary | ICD-10-CM | POA: Diagnosis not present

## 2024-01-05 DIAGNOSIS — R262 Difficulty in walking, not elsewhere classified: Secondary | ICD-10-CM

## 2024-01-05 NOTE — Therapy (Signed)
 OUTPATIENT PHYSICAL THERAPY THORACOLUMBAR Treatment/Progress note    Patient Name: Dawn [name] MRN: [id number] DOB:19-Mar-1946, 78 y.o., female Today's Date: 01/05/2024  END OF SESSION:  PT End of Session - 01/05/24 1459     Visit Number 59    Number of Visits 66    Date for PT Re-Evaluation 02/26/24    Authorization Type Progress note done at 57    PT Start Time 1102    PT Stop Time 1145    PT Time Calculation (min) 43 min    Activity Tolerance Patient tolerated treatment well;No increased pain    Behavior During Therapy [facility] for tasks assessed/performed                         Past Medical History:  Diagnosis Date   Allergy    CAD (coronary artery disease)    s/p DES to RCA in 06/2021   Dry eyes    Endometrial polyp    Essential hypertension 08/25/2018   GERD (gastroesophageal reflux disease)    History of hiatal hernia    History of kidney stones    Hot flashes, menopausal 10/13/2011   Estradiol  started    Hyperlipidemia    Jaundice as teenager   no problems since   Memory loss 09/21/2019     1/2 of feet numb all the time   Multiple sclerosis (HCC) dx 2001   Neuropathy    bilateral feet   Osteoarthritis    Sjogren's syndrome (HCC) dx oct 2021   sore muscvles, dry mouth and eyes   Stroke (HCC)    Vertigo    Vision abnormalities    Past Surgical History:  Procedure Laterality Date   ABDOMINAL HYSTERECTOMY  2002   partial   COLONOSCOPY  01/27/2022   2 day prep   colonscopy  2011   CORONARY STENT INTERVENTION N/A 07/18/2021   Procedure: CORONARY STENT INTERVENTION;  Surgeon: Lucendia Rusk, MD;  Location: [facility];  Service: Cardiovascular;  Laterality: N/A;   DILATATION & CURETTAGE/HYSTEROSCOPY WITH MYOSURE N/A 06/08/2020   Procedure: DILATATION & CURETTAGE/HYSTEROSCOPY/Polypectomy WITH MYOSURE;  Surgeon: Olin Bertin, DO;  Location: [facility];  Service: Gynecology;  Laterality: N/A;   LEFT HEART  CATH AND CORONARY ANGIOGRAPHY N/A 07/18/2021   Procedure: LEFT HEART CATH AND CORONARY ANGIOGRAPHY;  Surgeon: Lucendia Rusk, MD;  Location: [facility];  Service: Cardiovascular;  Laterality: N/A;   LUMBAR FUSION  2001   TOTAL HIP ARTHROPLASTY Right 01/17/2021   Procedure: TOTAL HIP ARTHROPLASTY ANTERIOR APPROACH;  Surgeon: Adonica Hoose, MD;  Location: [facility];  Service: Orthopedics;  Laterality: Right;   UPPER GI ENDOSCOPY  yrs ago   Patient Active Problem List   Diagnosis Date Noted   History of gross hematuria 11/02/2023   Constipation 11/02/2023   Chronic diastolic congestive heart failure (HCC) 11/02/2023   UTI (urinary tract infection) 07/09/2023   OAB (overactive bladder) 07/03/2023   Statin myopathy 06/26/2023   History of CVA (cerebrovascular accident) 06/26/2023   Rheumatoid arthritis involving right hand, unspecified whether rheumatoid factor present (HCC) 05/22/2023   Atherosclerotic heart disease of native coronary artery with other forms of angina pectoris (HCC) 05/22/2023   Positive colorectal cancer screening using Cologuard test 11/14/2021   Coronary artery disease    Statin intolerance 04/19/2021   Osteoarthritis of right hip 01/17/2021   Status post THR (total hip replacement) 01/17/2021   Chronic bilateral low back pain without  sciatica 10/02/2020   Chronic pain syndrome 06/20/2020   Post laminectomy syndrome 06/20/2020   Sjogren's disease (HCC) 05/23/2020   Primary osteoarthritis of both hands 05/23/2020   Primary osteoarthritis of both feet 05/23/2020   Sjogren's syndrome (HCC) 05/23/2020   Other secondary scoliosis, lumbar region 12/22/2019   Spondylosis without myelopathy or radiculopathy, lumbar region 12/22/2019   Cervical radiculopathy 10/18/2019   Numbness 09/21/2019   Bilateral carpal tunnel syndrome 09/21/2019   High risk medication use 09/21/2019   Memory loss 09/21/2019   Essential hypertension 08/25/2018   Abnormal SPEP 02/19/2018    Hand pain 02/20/2017   Multiple joint pain 02/18/2017   Disturbed cognition 06/25/2016   Impaired cognition 06/25/2016   Sciatica, right side 10/30/2015   Chronic fatigue 08/04/2014   Gait disturbance 08/04/2014   Unspecified visual disturbance 01/31/2013   Transient vision disturbance 01/31/2013   Colon polyps 11/03/2011   POSTHERPETIC NEURALGIA 10/03/2009   DISPLCMT LUMBAR INTERVERT DISC W/O MYELOPATHY 09/05/2009   RENAL CALCULUS, RECURRENT 09/04/2009   Hyperlipidemia 08/31/2009   Multiple sclerosis (HCC) 08/31/2009   ALLERGIC RHINITIS 08/31/2009  Progress Note Reporting Period 10/02/2023 to 12/18/2023  See note below for Objective Data and Assessment of Progress/Goals.     PCP: Jenetta Misty NP  REFERRING PROVIDER: Dr Christophe Cram   REFERRING DIAG: Low Back Pain   Rationale for Evaluation and Treatment: Rehabilitation  THERAPY DIAG:  Difficulty in walking, not elsewhere classified  Other low back pain  Muscle weakness (generalized)  ONSET DATE: About 11 days ago her nerve abiliation wore off.   SUBJECTIVE:                                                                                                                                                                                           SUBJECTIVE STATEMENT: The patient reports over the past week the pain in her left hip has been much better. She has been working on her exercises more at home. Seh is still having pain in the front of her hip but it is more tolerable   PERTINENT HISTORY:  Right Hip replacement 2022, Low back Pain, MS, TIA October 2023; MI, November 2022; CAD, Sjorgens syndrome  PAIN:  Are you having pain? Yes: NPRS scale: 3/10 Pain location left hip  Pain description: aching  Aggravating factors: standing and walking  Relieving factors: rest    PRECAUTIONS: None  WEIGHT BEARING RESTRICTIONS: No  FALLS:  Has patient fallen in last 6 months? 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   LUMBAR ROM:   AROM eval 5/15 10/3  Flexion Limited 50 % with pain  Mild improvement but still about 50%  Improved pain but still limited  Extension No limit     Right lateral flexion     Left lateral flexion     Right rotation Limited 50% with pain     Left rotation Limited 50% with pain      (Blank rows = not tested)  LOWER EXTREMITY ROM:     Passive  Right eval Left eval Right  Right  7/24 Right 1/2  Hip flexion 105 90 105 110 with pain at end range  100 with pain   Hip extension   10 degrees from neutral before manual today. Too neutral after stretching     Hip abduction       Hip adduction       Hip internal rotation painful Painful  Painful  Full with less pain  Painful   Hip external rotation   20 degrees with pain  45 with pain  48 with pain   Knee flexion       Knee extension       Ankle dorsiflexion       Ankle plantarflexion       Ankle inversion       Ankle eversion        (Blank rows = not tested)  LOWER EXTREMITY MMT:    MMT Right  10/2 Left 10/2 right 1/2 left 1/2 Right  3/7 Left 3/7  Right  5/30 Left  5/30  Hip flexion 15.3 23.4 16.7 23.4 17.5 20.4 14.8 17.4  Hip extension          Hip abduction 23.8 28.5 25.6 27.9 26.6 20.9 22.6 16.9  Hip adduction          Hip internal  rotation          Hip external rotation          Knee flexion          Knee extension 23.8 23.6 22.6 24.8 28.8 20.7 23.8 17.3  Ankle dorsiflexion          Ankle plantarflexion          Ankle inversion          Ankle eversion           (Blank rows = not tested)  FUNCTIONAL TESTS:  5 times sit to stand: 28 sec  5/15 5 times sit to stand test 20 seconds 7/25 24 sec  10/16 17 sec 1/2 17 seconds 3/14 18 sec   3-minute walk test 150 feet without requirement of seated rest break 5/15  7/25 3 min walk test 200'   10/16 3 min walk test 379'   3/14  5/30 5x sit to stand 23 sec  3 min walk test 268 with fatigue and left hip pain   GAIT:  TODAY'S TREATMENT:  DATE:  6/17 Manual:  Roller to left hip.   Trigger point release to the anterior hip.  Trigger point release to left lateral hip.   Nuero-re-ed: Air-ex: Narrow base 3x30 Heel/toe rock 3x10  both with min a for balance   Hip abduction 3x12 red Supine march 3x12 with abdominal bracing  Bridge 3x10   6/11 Manual:  Roller to left hip.   Trigger point release to the anterior hip.  Trigger point release to left lateral hip. LAD to left leg grade II and III   There-ex: LTR 2x15  SAQ 3x12 each leg 2lbs   Neuro-re-ed:   Hip abduction 3x12 red Supine march 3x12 with abdominal bracing  Weight shifting 3x10   There-act:  Step up 2 inch 3x10       PATIENT EDUCATION:  Education details: reviewed HEP, symptom management  Person educated: Patient Education method: Explanation, Demonstration, Tactile cues, Verbal cues, and Handouts Education comprehension: verbalized understanding, returned demonstration, verbal cues required, tactile cues required, and needs further education  HOME EXERCISE PROGRAM: Has previous HEP. Will review next visit  ASSESSMENT:  CLINICAL IMPRESSION: The  patient worked on balance activity today. She required min a to maintain balance on the air-ex. We continue to work on progressing her leg exercise. Therapy will continue to progress as tolerated.  OBJECTIVE IMPAIRMENTS: Abnormal gait, decreased activity tolerance, decreased balance, decreased endurance, decreased knowledge of use of DME, decreased mobility, difficulty walking, decreased ROM, decreased strength, increased muscle spasms, and pain.   ACTIVITY LIMITATIONS: carrying, lifting, bending, standing, squatting, sleeping, stairs, transfers, and locomotion level  PARTICIPATION LIMITATIONS: meal prep, cleaning, laundry, driving, shopping, community activity, and yard work  PERSONAL FACTORS: Right Hip replacement 2022, Low back Pain, MS, TIA October 2023; MI, November 2022; CAD, Sjorgens syndrome are also affecting patient's functional outcome. MS  REHAB POTENTIAL: Good  CLINICAL DECISION MAKING: Evolving/moderate complexity declining mobility   EVALUATION COMPLEXITY: Moderate   GOALS: Goals reviewed with patient? No  SHORT TERM GOALS: Target date: 09/29/2022    Patient will increase gross bilateral lower extremity strength by 5 pounds Baseline: Goal status: has lost strength 2nd to medical difficulties 5/30  2.  Patient will increase lumbar flexion by 10 degrees Baseline:  Goal status:  not tested today 2nd to pain 5/30   3.  Patient will be independent with basic after exercise program and stretching program Baseline:  Goal status: working her back towards exercises    LONG TERM GOALS: Target date: 09/29/2022     Patient will stand for greater than 30 minutes without increased pain Baseline:  Goal status: limited by left hip pain today 5/30  2.  Patient will pick item up off the floor without increased low back pain Baseline:  Goal status:met back improved 4/29    3.  Patient will ambulate community distances without report of increased low back pain Baseline:   Goal status:limited by pain but maintaining 7/25  4.  Patient will report improved ability to sleep through the night secondary to back pain Baseline:  Goal status: no pain 5/30    PLAN:  PT FREQUENCY: 1x/week  PT DURATION: 10 weeks   PLANNED INTERVENTIONS: Therapeutic exercises, Therapeutic activity, Neuromuscular re-education, Balance training, Gait training, Patient/Family education, Self Care, Joint mobilization, Stair training, DME instructions, Aquatic Therapy, Dry Needling, Spinal mobilization, Cryotherapy, Moist heat, Ionotophoresis 4mg /ml Dexamethasone , and Manual therapy.  PLAN FOR NEXT SESSION: Consider manual therapy to the back and hips, consider trigger point dry needling to back and  hips.  Reviewed basic HEP.  Begin active strengthening of hips and core.  Begin gait training and functional mobility training. Progress back into exercises.        Kitty Perkins, PT 10/28/2022, 3:00 PM   I

## 2024-01-13 ENCOUNTER — Ambulatory Visit (HOSPITAL_BASED_OUTPATIENT_CLINIC_OR_DEPARTMENT_OTHER): Admitting: Physical Therapy

## 2024-01-13 ENCOUNTER — Telehealth: Payer: Self-pay | Admitting: *Deleted

## 2024-01-13 DIAGNOSIS — M5459 Other low back pain: Secondary | ICD-10-CM | POA: Diagnosis not present

## 2024-01-13 DIAGNOSIS — R262 Difficulty in walking, not elsewhere classified: Secondary | ICD-10-CM

## 2024-01-13 DIAGNOSIS — M6281 Muscle weakness (generalized): Secondary | ICD-10-CM | POA: Diagnosis not present

## 2024-01-13 NOTE — Therapy (Signed)
 OUTPATIENT PHYSICAL THERAPY THORACOLUMBAR Treatment/Progress note    Patient Name: Dawn [name] MRN: [id number] DOB:1946/01/14, 78 y.o., female Today's Date: 01/14/2024  END OF SESSION:  PT End of Session - 01/14/24 1423     Visit Number 60    Number of Visits 66    Date for PT Re-Evaluation 02/26/24    PT Start Time 1015    PT Stop Time 1057    PT Time Calculation (min) 42 min    Activity Tolerance Patient tolerated treatment well;No increased pain    Behavior During Therapy [facility] for tasks assessed/performed                          Past Medical History:  Diagnosis Date   Allergy    CAD (coronary artery disease)    s/p DES to RCA in 06/2021   Dry eyes    Endometrial polyp    Essential hypertension 08/25/2018   GERD (gastroesophageal reflux disease)    History of hiatal hernia    History of kidney stones    Hot flashes, menopausal 10/13/2011   Estradiol  started    Hyperlipidemia    Jaundice as teenager   no problems since   Memory loss 09/21/2019     1/2 of feet numb all the time   Multiple sclerosis (HCC) dx 2001   Neuropathy    bilateral feet   Osteoarthritis    Sjogren's syndrome (HCC) dx oct 2021   sore muscvles, dry mouth and eyes   Stroke (HCC)    Vertigo    Vision abnormalities    Past Surgical History:  Procedure Laterality Date   ABDOMINAL HYSTERECTOMY  2002   partial   COLONOSCOPY  01/27/2022   2 day prep   colonscopy  2011   CORONARY STENT INTERVENTION N/A 07/18/2021   Procedure: CORONARY STENT INTERVENTION;  Surgeon: Dann Candyce RAMAN, MD;  Location: [facility];  Service: Cardiovascular;  Laterality: N/A;   DILATATION & CURETTAGE/HYSTEROSCOPY WITH MYOSURE N/A 06/08/2020   Procedure: DILATATION & CURETTAGE/HYSTEROSCOPY/Polypectomy WITH MYOSURE;  Surgeon: Rendell Calton DELENA, DO;  Location: [facility];  Service: Gynecology;  Laterality: N/A;   LEFT HEART CATH AND CORONARY ANGIOGRAPHY N/A 07/18/2021    Procedure: LEFT HEART CATH AND CORONARY ANGIOGRAPHY;  Surgeon: Dann Candyce RAMAN, MD;  Location: [facility] CV [facility];  Service: Cardiovascular;  Laterality: N/A;   LUMBAR FUSION  2001   TOTAL HIP ARTHROPLASTY Right 01/17/2021   Procedure: TOTAL HIP ARTHROPLASTY ANTERIOR APPROACH;  Surgeon: Fidel Rogue, MD;  Location: [facility];  Service: Orthopedics;  Laterality: Right;   UPPER GI ENDOSCOPY  yrs ago   Patient Active Problem List   Diagnosis Date Noted   History of gross hematuria 11/02/2023   Constipation 11/02/2023   Chronic diastolic congestive heart failure (HCC) 11/02/2023   UTI (urinary tract infection) 07/09/2023   OAB (overactive bladder) 07/03/2023   Statin myopathy 06/26/2023   History of CVA (cerebrovascular accident) 06/26/2023   Rheumatoid arthritis involving right hand, unspecified whether rheumatoid factor present (HCC) 05/22/2023   Atherosclerotic heart disease of native coronary artery with other forms of angina pectoris (HCC) 05/22/2023   Positive colorectal cancer screening using Cologuard test 11/14/2021   Coronary artery disease    Statin intolerance 04/19/2021   Osteoarthritis of right hip 01/17/2021   Status post THR (total hip replacement) 01/17/2021   Chronic bilateral low back pain without sciatica 10/02/2020   Chronic pain syndrome 06/20/2020  Post laminectomy syndrome 06/20/2020   Sjogren's disease (HCC) 05/23/2020   Primary osteoarthritis of both hands 05/23/2020   Primary osteoarthritis of both feet 05/23/2020   Sjogren's syndrome (HCC) 05/23/2020   Other secondary scoliosis, lumbar region 12/22/2019   Spondylosis without myelopathy or radiculopathy, lumbar region 12/22/2019   Cervical radiculopathy 10/18/2019   Numbness 09/21/2019   Bilateral carpal tunnel syndrome 09/21/2019   High risk medication use 09/21/2019   Memory loss 09/21/2019   Essential hypertension 08/25/2018   Abnormal SPEP 02/19/2018   Hand pain 02/20/2017   Multiple joint pain  02/18/2017   Disturbed cognition 06/25/2016   Impaired cognition 06/25/2016   Sciatica, right side 10/30/2015   Chronic fatigue 08/04/2014   Gait disturbance 08/04/2014   Unspecified visual disturbance 01/31/2013   Transient vision disturbance 01/31/2013   Colon polyps 11/03/2011   POSTHERPETIC NEURALGIA 10/03/2009   DISPLCMT LUMBAR INTERVERT DISC W/O MYELOPATHY 09/05/2009   RENAL CALCULUS, RECURRENT 09/04/2009   Hyperlipidemia 08/31/2009   Multiple sclerosis (HCC) 08/31/2009   ALLERGIC RHINITIS 08/31/2009  Progress Note Reporting Period 10/02/2023 to 12/18/2023  See note below for Objective Data and Assessment of Progress/Goals.     PCP: Roxan Feast NP  REFERRING PROVIDER: Dr Darryle Running   REFERRING DIAG: Low Back Pain   Rationale for Evaluation and Treatment: Rehabilitation  THERAPY DIAG:  Difficulty in walking, not elsewhere classified  Other low back pain  Muscle weakness (generalized)  ONSET DATE: About 11 days ago her nerve abiliation wore off.   SUBJECTIVE:                                                                                                                                                                                           SUBJECTIVE STATEMENT: The patient was reaching today and felt a pull in her neck. She is having shoulder soreness. Her hip has been feeling pretty good.  PERTINENT HISTORY:  Right Hip replacement 2022, Low back Pain, MS, TIA October 2023; MI, November 2022; CAD, Sjorgens syndrome  PAIN:  Are you having pain? Yes: NPRS scale: 3/10 Pain location left hip  Pain description: aching  Aggravating factors: standing and walking  Relieving factors: rest    PRECAUTIONS: None  WEIGHT BEARING RESTRICTIONS: No  FALLS:  Has patient fallen in last 6 months? 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   LUMBAR ROM:   AROM eval 5/15 10/3  Flexion Limited 50 % with pain  Mild improvement but still about 50%  Improved pain but still limited  Extension No limit     Right lateral flexion     Left lateral flexion     Right rotation Limited 50% with pain     Left rotation Limited 50% with pain      (Blank rows = not tested)  LOWER EXTREMITY ROM:     Passive  Right eval Left eval Right  Right  7/24 Right 1/2  Hip flexion 105 90 105 110 with pain at end range  100 with pain   Hip extension   10 degrees from neutral before manual today. Too neutral after stretching     Hip abduction       Hip adduction       Hip internal rotation painful Painful  Painful  Full with less pain  Painful   Hip external rotation   20 degrees with pain  45 with pain  48 with pain   Knee flexion       Knee extension       Ankle dorsiflexion       Ankle plantarflexion       Ankle inversion       Ankle eversion        (Blank rows = not tested)  LOWER EXTREMITY MMT:    MMT Right  10/2 Left 10/2 right 1/2 left 1/2 Right  3/7 Left 3/7  Right  5/30 Left  5/30  Hip flexion 15.3 23.4 16.7 23.4 17.5 20.4 14.8 17.4  Hip extension          Hip abduction 23.8 28.5 25.6 27.9 26.6 20.9 22.6 16.9  Hip adduction          Hip internal rotation          Hip external rotation          Knee flexion          Knee extension 23.8 23.6 22.6 24.8 28.8 20.7 23.8 17.3   Ankle dorsiflexion          Ankle plantarflexion          Ankle inversion          Ankle eversion           (Blank rows = not tested)  FUNCTIONAL TESTS:  5 times sit to stand: 28 sec  5/15 5 times sit to stand test 20 seconds 7/25 24 sec  10/16 17 sec 1/2 17 seconds 3/14 18 sec   3-minute walk test 150 feet without requirement of seated rest break 5/15  7/25 3 min walk test 200'   10/16 3 min walk test 379'   3/14  5/30 5x sit to stand 23 sec  3 min walk test 268 with fatigue and left hip pain   GAIT:  TODAY'S TREATMENT:  DATE:  6/25 Manual:  Roller to left hip.   Trigger point release to the anterior hip.  Trigger point release to left lateral hip.  Trigger point release to upper trap   Neuro-re-ed:  Supine march with TA 3x10   There-ex:  SAQ 2# 3x10  LAQ 2# 3x10   6/17 Manual:  Roller to left hip.   Trigger point release to the anterior hip.  Trigger point release to left lateral hip.   Nuero-re-ed: Air-ex: Narrow base 3x30 Heel/toe rock 3x10  both with min a for balance   Hip abduction 3x12 red Supine march 3x12 with abdominal bracing  Bridge 3x10   6/11 Manual:  Roller to left hip.   Trigger point release to the anterior hip.  Trigger point release to left lateral hip. LAD to left leg grade II and III   There-ex: LTR 2x15  SAQ 3x12 each leg 2lbs   Neuro-re-ed:   Hip abduction 3x12 red Supine march 3x12 with abdominal bracing  Weight shifting 3x10   There-act:  Step up 2 inch 3x10       PATIENT EDUCATION:  Education details: reviewed HEP, symptom management  Person educated: Patient Education method: Explanation, Demonstration, Tactile cues, Verbal cues, and Handouts Education comprehension: verbalized understanding, returned demonstration, verbal cues required, tactile cues required, and needs  further education  HOME EXERCISE PROGRAM: Has previous HEP. Will review next visit  ASSESSMENT:  CLINICAL IMPRESSION: The patient reported a significant improvement in tightness in her upper trap following soft tissue mobilization. She was shown how to do it at home. We reviewed lower extremity exercises. She tolerated well. Therapy will continue to progress as tolerated. We will progress back into standing exercises and balance exercises next visit.    OBJECTIVE IMPAIRMENTS: Abnormal gait, decreased activity tolerance, decreased balance, decreased endurance, decreased knowledge of use of DME, decreased mobility, difficulty walking, decreased ROM, decreased strength, increased muscle spasms, and pain.   ACTIVITY LIMITATIONS: carrying, lifting, bending, standing, squatting, sleeping, stairs, transfers, and locomotion level  PARTICIPATION LIMITATIONS: meal prep, cleaning, laundry, driving, shopping, community activity, and yard work  PERSONAL FACTORS: Right Hip replacement 2022, Low back Pain, MS, TIA October 2023; MI, November 2022; CAD, Sjorgens syndrome are also affecting patient's functional outcome. MS  REHAB POTENTIAL: Good  CLINICAL DECISION MAKING: Evolving/moderate complexity declining mobility   EVALUATION COMPLEXITY: Moderate   GOALS: Goals reviewed with patient? No  SHORT TERM GOALS: Target date: 09/29/2022    Patient will increase gross bilateral lower extremity strength by 5 pounds Baseline: Goal status: has lost strength 2nd to medical difficulties 5/30  2.  Patient will increase lumbar flexion by 10 degrees Baseline:  Goal status:  not tested today 2nd to pain 5/30   3.  Patient will be independent with basic after exercise program and stretching program Baseline:  Goal status: working her back towards exercises    LONG TERM GOALS: Target date: 09/29/2022     Patient will stand for greater than 30 minutes without increased pain Baseline:  Goal status:  limited by left hip pain today 5/30  2.  Patient will pick item up off the floor without increased low back pain Baseline:  Goal status:met back improved 4/29    3.  Patient will ambulate community distances without report of increased low back pain Baseline:  Goal status:limited by pain but maintaining 7/25  4.  Patient will report improved ability to sleep through the night secondary to back pain Baseline:  Goal status: no pain  5/30    PLAN:  PT FREQUENCY: 1x/week  PT DURATION: 10 weeks   PLANNED INTERVENTIONS: Therapeutic exercises, Therapeutic activity, Neuromuscular re-education, Balance training, Gait training, Patient/Family education, Self Care, Joint mobilization, Stair training, DME instructions, Aquatic Therapy, Dry Needling, Spinal mobilization, Cryotherapy, Moist heat, Ionotophoresis 4mg /ml Dexamethasone , and Manual therapy.  PLAN FOR NEXT SESSION: Consider manual therapy to the back and hips, consider trigger point dry needling to back and hips.  Reviewed basic HEP.  Begin active strengthening of hips and core.  Begin gait training and functional mobility training. Progress back into exercises.        Alm JINNY Don, PT 10/28/2022, 2:25 PM   I

## 2024-01-13 NOTE — Telephone Encounter (Signed)
 Tc from pt.  DOB verified.  Pt had questions about her cysto for tomorrow.  She thinks she has had this procedure before and feels as if her MS is the main contributor to her symptoms.  She is currently taking Gemtesa  and is feeling better and is sleeping through the night.  I explained to her that the cysto is looking at the inside of the bladder which is different than the UDS that she had with Kaitlin.  Pt understands the procedure and will be here about 10-15 minutes before her appt time at 10am.  Sotero CMA

## 2024-01-14 ENCOUNTER — Encounter (HOSPITAL_BASED_OUTPATIENT_CLINIC_OR_DEPARTMENT_OTHER): Payer: Self-pay | Admitting: Physical Therapy

## 2024-01-14 ENCOUNTER — Other Ambulatory Visit: Admitting: Obstetrics

## 2024-01-14 ENCOUNTER — Ambulatory Visit: Payer: Medicare Other | Admitting: Family

## 2024-01-14 NOTE — Progress Notes (Deleted)
 CYSTOSCOPY  CC:  This is a 78 y.o. with history of gross hematuria, neurogenic bladder with MS who presents today for cystoscopy.  CT urogram 12/07/23: CLINICAL DATA:  Gross hematuria.   EXAM: CT ABDOMEN AND PELVIS WITHOUT AND WITH CONTRAST   TECHNIQUE: Multidetector CT imaging of the abdomen and pelvis was performed following the standard protocol before and following the bolus administration of intravenous contrast.   RADIATION DOSE REDUCTION: This exam was performed according to the departmental dose-optimization program which includes automated exposure control, adjustment of the mA and/or kV according to patient size and/or use of iterative reconstruction technique.   CONTRAST:  OMNIPAQUE  IOHEXOL  300 MG/ML  SOLN   COMPARISON:  04/19/2014   FINDINGS: Lower chest:  No acute findings.   Hepatobiliary: No suspicious focal abnormality within the liver parenchyma. There is no evidence for gallstones, gallbladder wall thickening, or pericholecystic fluid. No intrahepatic or extrahepatic biliary dilation.   Pancreas: No focal mass lesion. No dilatation of the main duct. No intraparenchymal cyst. No peripancreatic edema.   Spleen: No splenomegaly. No suspicious focal mass lesion.   Adrenals/Urinary Tract: No adrenal nodule or mass.   Precontrast imaging shows no stones in the right kidney or ureter. 10 x 8 x 11 mm nonobstructing stone identified lower pole left kidney with an adjacent 7 x 6 mm nonobstructing left lower pole renal stone. No left ureteral stones. Bladder partially obscured by beam hardening artifact from right hip replacement but no bladder stone evident.   Imaging after IV contrast administration reveals 2 simple cysts in the right kidney measuring up to 3.9 cm diameter. No followup imaging is recommended. Tiny well-defined homogeneous low-density lesions in both kidneys are too small to characterize but are statistically most likely benign and probably cysts. No  followup imaging is recommended.   Delayed post-contrast imaging shows no wall thickening or soft tissue filling defect in either intrarenal collecting system or renal pelvis. Right ureter is well opacified and unremarkable. Left ureter is incompletely opacified but shows no focal dilatation, wall thickening or evidence for soft tissue mass lesion. Delayed imaging of the bladder shows no focal wall thickening or mass lesion.   Stomach/Bowel: Small hiatal hernia. Stomach otherwise unremarkable. Duodenum is normally positioned as is the ligament of Treitz. Duodenal diverticulum noted. No small bowel wall thickening. No small bowel dilatation. The terminal ileum is normal. The appendix is normal. No gross colonic mass. No colonic wall thickening.   Vascular/Lymphatic: There is advanced atherosclerotic calcification of the abdominal aorta without aneurysm. There is no gastrohepatic or hepatoduodenal ligament lymphadenopathy. No retroperitoneal or mesenteric lymphadenopathy. No pelvic sidewall lymphadenopathy.   Reproductive: The uterus is unremarkable.  There is no adnexal mass.   Other: No intraperitoneal free fluid.   Musculoskeletal: No worrisome lytic or sclerotic osseous abnormality. Status post right hip replacement advanced degenerative disc disease noted L2-3.   IMPRESSION: 1. Nonobstructing left lower pole renal stones measuring up to 10 x 8 x 11 mm. No right renal, ureteral, or bladder stones. No hydroureteronephrosis. 2. No other findings to account for the patient's history of hematuria. 3. Small hiatal hernia. 4.  Aortic Atherosclerosis (ICD10-I70.0).     Electronically Signed   By: Camellia Candle M.D.   On: 12/06/2023 08:02   Lab Results  Component Value Date   COLORU yellow 11/18/2023   CLARITYU clear 11/18/2023   GLUCOSEUR Negative 11/18/2023   BILIRUBINUR negative 11/18/2023   KETONESU negative 11/18/2023   SPECGRAV 1.015 11/18/2023   RBCUR Trace  intact 11/18/2023    PHUR 7.0 11/18/2023   PROTEINUR Negative 11/18/2023   UROBILINOGEN 0.2 11/18/2023   LEUKOCYTESUR Trace (A) 11/18/2023     There were no vitals taken for this visit.  CYSTOSCOPY: A time out was performed.  The periurethral area was prepped and draped in a sterile manner.  2% lidocaine  jetpack was inserted at the urethral meatus and the urethra and bladder visualized with 12- and 70-degree scopes.  She had {Desc; normal/diminished:12763} urethral coaptation and {Desc; normal/abnormal w/wildcard:19060} urethral mucosa.  She had {Desc; normal/abnormal w/wildcard:19060} bladder mucosa. She had bilateral clear efflux from both ureteral orifices.  She had no squamous metaplasia at the trigone, no trabeculations, cellules or diverticuli.     ASSESSMENT:  78 y.o. with {cysto problems:24783}. Cystoscopy today is {Desc; normal/abnormal w/wildcard:19060}.  PLAN:  Follow-up to discuss findings and treatment options.  All questions answered and post-procedures instructions were given  Dawn ONEIDA Gillis, MD

## 2024-01-19 ENCOUNTER — Ambulatory Visit: Admitting: Obstetrics

## 2024-01-21 ENCOUNTER — Ambulatory Visit: Admitting: Obstetrics

## 2024-01-26 ENCOUNTER — Encounter: Payer: Self-pay | Admitting: Obstetrics

## 2024-01-26 ENCOUNTER — Ambulatory Visit (INDEPENDENT_AMBULATORY_CARE_PROVIDER_SITE_OTHER): Admitting: Obstetrics

## 2024-01-26 VITALS — BP 148/78 | HR 66

## 2024-01-26 DIAGNOSIS — R319 Hematuria, unspecified: Secondary | ICD-10-CM | POA: Diagnosis not present

## 2024-01-26 DIAGNOSIS — N3281 Overactive bladder: Secondary | ICD-10-CM | POA: Diagnosis not present

## 2024-01-26 DIAGNOSIS — Z87898 Personal history of other specified conditions: Secondary | ICD-10-CM

## 2024-01-26 DIAGNOSIS — N329 Bladder disorder, unspecified: Secondary | ICD-10-CM | POA: Diagnosis not present

## 2024-01-26 DIAGNOSIS — Z87440 Personal history of urinary (tract) infections: Secondary | ICD-10-CM

## 2024-01-26 DIAGNOSIS — N390 Urinary tract infection, site not specified: Secondary | ICD-10-CM

## 2024-01-26 LAB — POCT URINALYSIS DIP (CLINITEK)
Bilirubin, UA: NEGATIVE
Blood, UA: NEGATIVE
Glucose, UA: NEGATIVE mg/dL
Ketones, POC UA: NEGATIVE mg/dL
Nitrite, UA: NEGATIVE
POC PROTEIN,UA: NEGATIVE
Spec Grav, UA: 1.015 (ref 1.010–1.025)
Urobilinogen, UA: 0.2 U/dL
pH, UA: 7 (ref 5.0–8.0)

## 2024-01-26 NOTE — Progress Notes (Signed)
 CYSTOSCOPY  CC:  This is a 78 y.o. with gross hematuria and neurogenic bladder who presents today for cystoscopy.  Resolution of nocturia since starting Gemtesa  Undergoing PT for gait  Denies gross hematuria or UTI since last visit in 11/02/23   CT urogram 12/06/23 CLINICAL DATA:  Gross hematuria.   EXAM: CT ABDOMEN AND PELVIS WITHOUT AND WITH CONTRAST   TECHNIQUE: Multidetector CT imaging of the abdomen and pelvis was performed following the standard protocol before and following the bolus administration of intravenous contrast.   RADIATION DOSE REDUCTION: This exam was performed according to the departmental dose-optimization program which includes automated exposure control, adjustment of the mA and/or kV according to patient size and/or use of iterative reconstruction technique.   CONTRAST:  OMNIPAQUE  IOHEXOL  300 MG/ML  SOLN   COMPARISON:  04/19/2014   FINDINGS: Lower chest:  No acute findings.   Hepatobiliary: No suspicious focal abnormality within the liver parenchyma. There is no evidence for gallstones, gallbladder wall thickening, or pericholecystic fluid. No intrahepatic or extrahepatic biliary dilation.   Pancreas: No focal mass lesion. No dilatation of the main duct. No intraparenchymal cyst. No peripancreatic edema.   Spleen: No splenomegaly. No suspicious focal mass lesion.   Adrenals/Urinary Tract: No adrenal nodule or mass.   Precontrast imaging shows no stones in the right kidney or ureter. 10 x 8 x 11 mm nonobstructing stone identified lower pole left kidney with an adjacent 7 x 6 mm nonobstructing left lower pole renal stone. No left ureteral stones. Bladder partially obscured by beam hardening artifact from right hip replacement but no bladder stone evident.   Imaging after IV contrast administration reveals 2 simple cysts in the right kidney measuring up to 3.9 cm diameter. No followup imaging is recommended. Tiny well-defined homogeneous low-density  lesions in both kidneys are too small to characterize but are statistically most likely benign and probably cysts. No followup imaging is recommended.   Delayed post-contrast imaging shows no wall thickening or soft tissue filling defect in either intrarenal collecting system or renal pelvis. Right ureter is well opacified and unremarkable. Left ureter is incompletely opacified but shows no focal dilatation, wall thickening or evidence for soft tissue mass lesion. Delayed imaging of the bladder shows no focal wall thickening or mass lesion.   Stomach/Bowel: Small hiatal hernia. Stomach otherwise unremarkable. Duodenum is normally positioned as is the ligament of Treitz. Duodenal diverticulum noted. No small bowel wall thickening. No small bowel dilatation. The terminal ileum is normal. The appendix is normal. No gross colonic mass. No colonic wall thickening.   Vascular/Lymphatic: There is advanced atherosclerotic calcification of the abdominal aorta without aneurysm. There is no gastrohepatic or hepatoduodenal ligament lymphadenopathy. No retroperitoneal or mesenteric lymphadenopathy. No pelvic sidewall lymphadenopathy.   Reproductive: The uterus is unremarkable.  There is no adnexal mass.   Other: No intraperitoneal free fluid.   Musculoskeletal: No worrisome lytic or sclerotic osseous abnormality. Status post right hip replacement advanced degenerative disc disease noted L2-3.   IMPRESSION: 1. Nonobstructing left lower pole renal stones measuring up to 10 x 8 x 11 mm. No right renal, ureteral, or bladder stones. No hydroureteronephrosis. 2. No other findings to account for the patient's history of hematuria. 3. Small hiatal hernia. 4.  Aortic Atherosclerosis (ICD10-I70.0).     Electronically Signed   By: Camellia Candle M.D.   On: 12/06/2023 08:02  Results for orders placed or performed in visit on 01/26/24  POCT URINALYSIS DIP (CLINITEK)   Collection Time: 01/26/24 10:18  AM  Result  Value Ref Range   Color, UA yellow yellow   Clarity, UA clear clear   Glucose, UA negative negative mg/dL   Bilirubin, UA negative negative   Ketones, POC UA negative negative mg/dL   Spec Grav, UA 8.984 8.989 - 1.025   Blood, UA negative negative   pH, UA 7.0 5.0 - 8.0   POC PROTEIN,UA negative negative, trace   Urobilinogen, UA 0.2 0.2 or 1.0 E.U./dL   Nitrite, UA Negative Negative   Leukocytes, UA Trace (A) Negative   *Note: Due to a large number of results and/or encounters for the requested time period, some results have not been displayed. A complete set of results can be found in Results Review.    BP (!) 148/78   Pulse 66   CYSTOSCOPY: A time out was performed.  The periurethral area was prepped and draped in a sterile manner.  2% lidocaine  jetpack was inserted at the urethral meatus. The urethra and bladder were visualized with flexible cystoscope.  She had normal urethral coaptation and normal urethral mucosa.  She had abnormal  villi-like projection in the trigone of bladder mucosa near the urethral opening and a single diverticulum at the right bladder dome. She had bilateral clear efflux from both ureteral orifices.  She had no squamous metaplasia at the trigone, no trabeculations, or cellules.     Media Information   Document Information  Photos  Bladder trigone near urethral opening  01/26/2024 11:18  Attached To:  Procedure visit on 01/26/24 with Guadlupe Lianne DASEN, MD  Source Information  Guadlupe Lianne DASEN, MD  [facility]    Post Void Residual - 01/26/24 1124       Post Void Residual   Post Void Residual 28 mL         ASSESSMENT:  78 y.o. with history of gross hematuria, neurogenic bladder, and frequent UTIs. Cystoscopy today is abnormal  with bladder lesion with known right renal stone on imaging.  PLAN:  - continue gemtesa  due to resolution of nocturia, PVR WNL at 28mL post-procedure - continue vaginal estrogen 1g 2x/week, return if change in urinary  symptoms - referral to urology for bladder lesions around urethral orifice and history of kidney stones -  All questions answered and post-procedures instructions were given - follow-up scheduled 05/13/24  Lianne DASEN Guadlupe, MD

## 2024-01-26 NOTE — Patient Instructions (Addendum)
 Taking Care of Yourself after Urodynamics, Cystoscopy, Bulkamid Injection, or Botox Injection   Drink plenty of water  for a day or two following your procedure. Try to have about 8 ounces (one cup) at a time, and do this 6 times or more per day unless you have fluid restrictitons AVOID irritative beverages such as coffee, tea, soda, alcoholic or citrus drinks for a day or two, as this may cause burning with urination.  For the first 1-2 days after the procedure, your urine may be pink or red in color. You may have some blood in your urine as a normal side effect of the procedure. Large amounts of bleeding or difficulty urinating are NOT normal. Call the nurse line if this happens or go to the nearest Emergency Room if the bleeding is heavy or you cannot urinate at all and it is after hours. If you had a Bulkamid injection in the urethra and need to be catheterized, ask for a pediatric catheter to be used (size 10 or 12-French) so the material is not pushed out of place.   You may experience some discomfort or a burning sensation with urination after having this procedure. You can use over the counter Azo or pyridium to help with burning and follow the instructions on the packaging. If it does not improve within 1-2 days, or other symptoms appear (fever, chills, or difficulty urinating) call the office to speak to a nurse.  You may return to normal daily activities such as work, school, driving, exercising and housework on the day of the procedure. If your doctor gave you a prescription, take it as ordered.    Continue Gemtesa  1 tablet daily.  Continue vaginal estrogen 1g twice a week.

## 2024-01-28 ENCOUNTER — Encounter (HOSPITAL_BASED_OUTPATIENT_CLINIC_OR_DEPARTMENT_OTHER): Payer: Self-pay | Admitting: Physical Therapy

## 2024-01-28 ENCOUNTER — Ambulatory Visit (HOSPITAL_BASED_OUTPATIENT_CLINIC_OR_DEPARTMENT_OTHER): Attending: Physical Medicine and Rehabilitation | Admitting: Physical Therapy

## 2024-01-28 DIAGNOSIS — R262 Difficulty in walking, not elsewhere classified: Secondary | ICD-10-CM | POA: Insufficient documentation

## 2024-01-28 DIAGNOSIS — M6281 Muscle weakness (generalized): Secondary | ICD-10-CM | POA: Diagnosis not present

## 2024-01-28 DIAGNOSIS — M5459 Other low back pain: Secondary | ICD-10-CM | POA: Diagnosis not present

## 2024-01-29 ENCOUNTER — Encounter (HOSPITAL_BASED_OUTPATIENT_CLINIC_OR_DEPARTMENT_OTHER): Payer: Self-pay | Admitting: Physical Therapy

## 2024-01-29 NOTE — Therapy (Signed)
 OUTPATIENT PHYSICAL THERAPY THORACOLUMBAR Treatment/Progress note    Patient Name: Dawn [name] MRN: [id number] DOB:[birth year], 78 y.o., female Today's Date: 01/29/2024  END OF SESSION:  PT End of Session - 01/28/24 1238     Visit Number 61    Number of Visits 66    Date for PT Re-Evaluation 02/26/24    Authorization Type Progress note done at 57    PT Start Time 1230    PT Stop Time 1311    PT Time Calculation (min) 41 min    Activity Tolerance Patient tolerated treatment well;No increased pain    Behavior During Therapy [facility] for tasks assessed/performed                          Past Medical History:  Diagnosis Date   Allergy    CAD (coronary artery disease)    s/p DES to RCA in 06/2021   Dry eyes    Endometrial polyp    Essential hypertension 08/25/2018   GERD (gastroesophageal reflux disease)    History of hiatal hernia    History of kidney stones    Hot flashes, menopausal 10/13/2011   Estradiol  started    Hyperlipidemia    Jaundice as teenager   no problems since   Memory loss 09/21/2019     1/2 of feet numb all the time   Multiple sclerosis (HCC) dx 2001   Neuropathy    bilateral feet   Osteoarthritis    Sjogren's syndrome (HCC) dx oct 2021   sore muscvles, dry mouth and eyes   Stroke (HCC)    Vertigo    Vision abnormalities    Past Surgical History:  Procedure Laterality Date   ABDOMINAL HYSTERECTOMY  2002   partial   COLONOSCOPY  01/27/2022   2 day prep   colonscopy  2011   CORONARY STENT INTERVENTION N/A 07/18/2021   Procedure: CORONARY STENT INTERVENTION;  Surgeon: Dann Candyce RAMAN, MD;  Location: [facility];  Service: Cardiovascular;  Laterality: N/A;   DILATATION & CURETTAGE/HYSTEROSCOPY WITH MYOSURE N/A 06/08/2020   Procedure: DILATATION & CURETTAGE/HYSTEROSCOPY/Polypectomy WITH MYOSURE;  Surgeon: Rendell Calton DELENA, DO;  Location: [facility];  Service: Gynecology;  Laterality: N/A;   LEFT HEART  CATH AND CORONARY ANGIOGRAPHY N/A 07/18/2021   Procedure: LEFT HEART CATH AND CORONARY ANGIOGRAPHY;  Surgeon: Dann Candyce RAMAN, MD;  Location: [facility];  Service: Cardiovascular;  Laterality: N/A;   LUMBAR FUSION  2001   TOTAL HIP ARTHROPLASTY Right 01/17/2021   Procedure: TOTAL HIP ARTHROPLASTY ANTERIOR APPROACH;  Surgeon: Fidel Rogue, MD;  Location: [facility];  Service: Orthopedics;  Laterality: Right;   UPPER GI ENDOSCOPY  yrs ago   Patient Active Problem List   Diagnosis Date Noted   History of gross hematuria 11/02/2023   Constipation 11/02/2023   Chronic diastolic congestive heart failure (HCC) 11/02/2023   UTI (urinary tract infection) 07/09/2023   OAB (overactive bladder) 07/03/2023   Statin myopathy 06/26/2023   History of CVA (cerebrovascular accident) 06/26/2023   Rheumatoid arthritis involving right hand, unspecified whether rheumatoid factor present (HCC) 05/22/2023   Atherosclerotic heart disease of native coronary artery with other forms of angina pectoris (HCC) 05/22/2023   Positive colorectal cancer screening using Cologuard test 11/14/2021   Coronary artery disease    Statin intolerance 04/19/2021   Osteoarthritis of right hip 01/17/2021   Status post THR (total hip replacement) 01/17/2021   Chronic bilateral low back pain  without sciatica 10/02/2020   Chronic pain syndrome 06/20/2020   Post laminectomy syndrome 06/20/2020   Sjogren's disease (HCC) 05/23/2020   Primary osteoarthritis of both hands 05/23/2020   Primary osteoarthritis of both feet 05/23/2020   Sjogren's syndrome (HCC) 05/23/2020   Other secondary scoliosis, lumbar region 12/22/2019   Spondylosis without myelopathy or radiculopathy, lumbar region 12/22/2019   Cervical radiculopathy 10/18/2019   Numbness 09/21/2019   Bilateral carpal tunnel syndrome 09/21/2019   High risk medication use 09/21/2019   Memory loss 09/21/2019   Essential hypertension 08/25/2018   Abnormal SPEP 02/19/2018    Hand pain 02/20/2017   Multiple joint pain 02/18/2017   Disturbed cognition 06/25/2016   Impaired cognition 06/25/2016   Sciatica, right side 10/30/2015   Chronic fatigue 08/04/2014   Gait disturbance 08/04/2014   Unspecified visual disturbance 01/31/2013   Transient vision disturbance 01/31/2013   Colon polyps 11/03/2011   POSTHERPETIC NEURALGIA 10/03/2009   DISPLCMT LUMBAR INTERVERT DISC W/O MYELOPATHY 09/05/2009   RENAL CALCULUS, RECURRENT 09/04/2009   Hyperlipidemia 08/31/2009   Multiple sclerosis (HCC) 08/31/2009   ALLERGIC RHINITIS 08/31/2009  Progress Note Reporting Period 10/02/2023 to 12/18/2023  See note below for Objective Data and Assessment of Progress/Goals.     PCP: Roxan Feast NP  REFERRING PROVIDER: Dr Darryle Running   REFERRING DIAG: Low Back Pain   Rationale for Evaluation and Treatment: Rehabilitation  THERAPY DIAG:  Difficulty in walking, not elsewhere classified  Other low back pain  Muscle weakness (generalized)  ONSET DATE: About 11 days ago her nerve abiliation wore off.   SUBJECTIVE:                                                                                                                                                                                           SUBJECTIVE STATEMENT: The patient was reaching today and felt a pull in her neck. She is having shoulder soreness. Her hip has been feeling pretty good.    PERTINENT HISTORY:  Right Hip replacement 2022, Low back Pain, MS, TIA October 2023; MI, November 2022; CAD, Sjorgens syndrome  PAIN:  Are you having pain? Yes: NPRS scale: 3/10 Pain location left hip  Pain description: aching  Aggravating factors: standing and walking  Relieving factors: rest    PRECAUTIONS: None  WEIGHT BEARING RESTRICTIONS: No  FALLS:  Has patient fallen in last 6 months? 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   LUMBAR ROM:   AROM eval 5/15 10/3  Flexion Limited 50 % with pain  Mild improvement but still about 50%  Improved pain but still limited  Extension No limit     Right lateral flexion     Left lateral flexion     Right rotation Limited 50% with pain     Left rotation Limited 50% with pain      (Blank rows = not tested)  LOWER EXTREMITY ROM:     Passive  Right eval Left eval Right  Right  7/24 Right 1/2  Hip flexion 105 90 105 110 with pain at end range  100 with pain   Hip extension   10 degrees from neutral before manual today. Too neutral after stretching     Hip abduction       Hip adduction       Hip internal rotation painful Painful  Painful  Full with less pain  Painful   Hip external rotation   20 degrees with pain  45 with pain  48 with pain   Knee flexion       Knee extension       Ankle dorsiflexion       Ankle plantarflexion       Ankle inversion       Ankle eversion        (Blank rows = not tested)  LOWER EXTREMITY MMT:    MMT Right  10/2 Left 10/2 right 1/2 left 1/2 Right  3/7 Left 3/7  Right  5/30 Left  5/30  Hip flexion 15.3 23.4 16.7 23.4 17.5 20.4 14.8 17.4  Hip extension          Hip abduction 23.8 28.5 25.6 27.9 26.6 20.9 22.6 16.9  Hip adduction          Hip internal rotation          Hip external rotation          Knee flexion          Knee  extension 23.8 23.6 22.6 24.8 28.8 20.7 23.8 17.3  Ankle dorsiflexion          Ankle plantarflexion          Ankle inversion          Ankle eversion           (Blank rows = not tested)  FUNCTIONAL TESTS:  5 times sit to stand: 28 sec  5/15 5 times sit to stand test 20 seconds 7/25 24 sec  10/16 17 sec 1/2 17 seconds 3/14 18 sec   3-minute walk test 150 feet without requirement of seated rest break 5/15  7/25 3 min walk test 200'   10/16 3 min walk test 379'   3/14  5/30 5x sit to stand 23 sec  3 min walk test 268 with fatigue and left hip pain   GAIT:  TODAY'S TREATMENT:  DATE:  7/11  Neuro-re-ed:  Supine march with TA 3x10  Hip abduction with band green 3 x 10 with abdominal breathing Bridge 3 x 10 cueing for glutes squeeze and abdominal tightening  Seated row with cueing for posture 3 x 10 red Shoulder extension red band 3 x 10   TherEX LAQ 3 x 10 red band each leg Supine wand flexion 1 pound 3 x 10 Biceps curls 3x10 1 lb    6/25 Manual:  Roller to left hip.   Trigger point release to the anterior hip.  Trigger point release to left lateral hip.  Trigger point release to upper trap   Neuro-re-ed:  Supine march with TA 3x10   There-ex:  SAQ 2# 3x10  LAQ 2# 3x10   6/17 Manual:  Roller to left hip.   Trigger point release to the anterior hip.  Trigger point release to left lateral hip.   Nuero-re-ed: Air-ex: Narrow base 3x30 Heel/toe rock 3x10  both with min a for balance   Hip abduction 3x12 red Supine march 3x12 with abdominal bracing  Bridge 3x10   6/11 Manual:  Roller to left hip.   Trigger point release to the anterior hip.  Trigger point release to left lateral hip. LAD to left leg grade II and III   There-ex: LTR 2x15  SAQ 3x12 each leg 2lbs   Neuro-re-ed:   Hip abduction 3x12 red Supine march  3x12 with abdominal bracing  Weight shifting 3x10   There-act:  Step up 2 inch 3x10       PATIENT EDUCATION:  Education details: reviewed HEP, symptom management  Person educated: Patient Education method: Explanation, Demonstration, Tactile cues, Verbal cues, and Handouts Education comprehension: verbalized understanding, returned demonstration, verbal cues required, tactile cues required, and needs further education  HOME EXERCISE PROGRAM: Has previous HEP. Will review next visit  ASSESSMENT:  CLINICAL IMPRESSION: Therapy tolerated treatment well.  She had improved movement of her shoulders this visit.  We focus on exercises to improve shoulder motion and strength.  We also worked on exercise to focus on core stability and strength.  She continues to require skilled therapy for exercise progression.  Therapy will continue to progress as tolerated.  OBJECTIVE IMPAIRMENTS: Abnormal gait, decreased activity tolerance, decreased balance, decreased endurance, decreased knowledge of use of DME, decreased mobility, difficulty walking, decreased ROM, decreased strength, increased muscle spasms, and pain.   ACTIVITY LIMITATIONS: carrying, lifting, bending, standing, squatting, sleeping, stairs, transfers, and locomotion level  PARTICIPATION LIMITATIONS: meal prep, cleaning, laundry, driving, shopping, community activity, and yard work  PERSONAL FACTORS: Right Hip replacement 2022, Low back Pain, MS, TIA October 2023; MI, November 2022; CAD, Sjorgens syndrome are also affecting patient's functional outcome. MS  REHAB POTENTIAL: Good  CLINICAL DECISION MAKING: Evolving/moderate complexity declining mobility   EVALUATION COMPLEXITY: Moderate   GOALS: Goals reviewed with patient? No  SHORT TERM GOALS: Target date: 09/29/2022    Patient will increase gross bilateral lower extremity strength by 5 pounds Baseline: Goal status: has lost strength 2nd to medical difficulties 5/30  2.   Patient will increase lumbar flexion by 10 degrees Baseline:  Goal status:  not tested today 2nd to pain 5/30   3.  Patient will be independent with basic after exercise program and stretching program Baseline:  Goal status: working her back towards exercises    LONG TERM GOALS: Target date: 09/29/2022     Patient will stand for greater than 30 minutes without increased pain Baseline:  Goal status:  limited by left hip pain today 5/30  2.  Patient will pick item up off the floor without increased low back pain Baseline:  Goal status:met back improved 4/29    3.  Patient will ambulate community distances without report of increased low back pain Baseline:  Goal status:limited by pain but maintaining 7/25  4.  Patient will report improved ability to sleep through the night secondary to back pain Baseline:  Goal status: no pain 5/30    PLAN:  PT FREQUENCY: 1x/week  PT DURATION: 10 weeks   PLANNED INTERVENTIONS: Therapeutic exercises, Therapeutic activity, Neuromuscular re-education, Balance training, Gait training, Patient/Family education, Self Care, Joint mobilization, Stair training, DME instructions, Aquatic Therapy, Dry Needling, Spinal mobilization, Cryotherapy, Moist heat, Ionotophoresis 4mg /ml Dexamethasone , and Manual therapy.  PLAN FOR NEXT SESSION: Consider manual therapy to the back and hips, consider trigger point dry needling to back and hips.  Reviewed basic HEP.  Begin active strengthening of hips and core.  Begin gait training and functional mobility training. Progress back into exercises.        Alm JINNY Don, PT 10/28/2022, 10:40 AM   I

## 2024-02-04 ENCOUNTER — Encounter (HOSPITAL_BASED_OUTPATIENT_CLINIC_OR_DEPARTMENT_OTHER): Payer: Self-pay | Admitting: Physical Therapy

## 2024-02-04 ENCOUNTER — Ambulatory Visit (HOSPITAL_BASED_OUTPATIENT_CLINIC_OR_DEPARTMENT_OTHER): Admitting: Physical Therapy

## 2024-02-04 DIAGNOSIS — M5459 Other low back pain: Secondary | ICD-10-CM

## 2024-02-04 DIAGNOSIS — M6281 Muscle weakness (generalized): Secondary | ICD-10-CM

## 2024-02-04 DIAGNOSIS — R262 Difficulty in walking, not elsewhere classified: Secondary | ICD-10-CM

## 2024-02-04 NOTE — Therapy (Signed)
 OUTPATIENT PHYSICAL THERAPY THORACOLUMBAR Treatment/Progress note    Patient Name: Dawn [name] MRN: [id number] DOB:Sep 03, 1945, 78 y.o., female Today's Date: 02/05/2024  END OF SESSION:  PT End of Session - 02/04/24 1239     Visit Number 62    Number of Visits 66    Date for PT Re-Evaluation 02/26/24    Authorization Type Progress note done at 57    PT Start Time 1230    PT Stop Time 1314    PT Time Calculation (min) 44 min    Activity Tolerance Patient tolerated treatment well;No increased pain    Behavior During Therapy [facility] for tasks assessed/performed                          Past Medical History:  Diagnosis Date   Allergy    CAD (coronary artery disease)    s/p DES to RCA in 06/2021   Dry eyes    Endometrial polyp    Essential hypertension 08/25/2018   GERD (gastroesophageal reflux disease)    History of hiatal hernia    History of kidney stones    Hot flashes, menopausal 10/13/2011   Estradiol  started    Hyperlipidemia    Jaundice as teenager   no problems since   Memory loss 09/21/2019     1/2 of feet numb all the time   Multiple sclerosis (HCC) dx 2001   Neuropathy    bilateral feet   Osteoarthritis    Sjogren's syndrome (HCC) dx oct 2021   sore muscvles, dry mouth and eyes   Stroke (HCC)    Vertigo    Vision abnormalities    Past Surgical History:  Procedure Laterality Date   ABDOMINAL HYSTERECTOMY  2002   partial   COLONOSCOPY  01/27/2022   2 day prep   colonscopy  2011   CORONARY STENT INTERVENTION N/A 07/18/2021   Procedure: CORONARY STENT INTERVENTION;  Surgeon: Dann Candyce RAMAN, MD;  Location: [facility];  Service: Cardiovascular;  Laterality: N/A;   DILATATION & CURETTAGE/HYSTEROSCOPY WITH MYOSURE N/A 06/08/2020   Procedure: DILATATION & CURETTAGE/HYSTEROSCOPY/Polypectomy WITH MYOSURE;  Surgeon: Rendell Calton DELENA, DO;  Location: [facility];  Service: Gynecology;  Laterality: N/A;   LEFT HEART  CATH AND CORONARY ANGIOGRAPHY N/A 07/18/2021   Procedure: LEFT HEART CATH AND CORONARY ANGIOGRAPHY;  Surgeon: Dann Candyce RAMAN, MD;  Location: [facility];  Service: Cardiovascular;  Laterality: N/A;   LUMBAR FUSION  2001   TOTAL HIP ARTHROPLASTY Right 01/17/2021   Procedure: TOTAL HIP ARTHROPLASTY ANTERIOR APPROACH;  Surgeon: Fidel Rogue, MD;  Location: [facility];  Service: Orthopedics;  Laterality: Right;   UPPER GI ENDOSCOPY  yrs ago   Patient Active Problem List   Diagnosis Date Noted   History of gross hematuria 11/02/2023   Constipation 11/02/2023   Chronic diastolic congestive heart failure (HCC) 11/02/2023   UTI (urinary tract infection) 07/09/2023   OAB (overactive bladder) 07/03/2023   Statin myopathy 06/26/2023   History of CVA (cerebrovascular accident) 06/26/2023   Rheumatoid arthritis involving right hand, unspecified whether rheumatoid factor present (HCC) 05/22/2023   Atherosclerotic heart disease of native coronary artery with other forms of angina pectoris (HCC) 05/22/2023   Positive colorectal cancer screening using Cologuard test 11/14/2021   Coronary artery disease    Statin intolerance 04/19/2021   Osteoarthritis of right hip 01/17/2021   Status post THR (total hip replacement) 01/17/2021   Chronic bilateral low back pain  without sciatica 10/02/2020   Chronic pain syndrome 06/20/2020   Post laminectomy syndrome 06/20/2020   Sjogren's disease (HCC) 05/23/2020   Primary osteoarthritis of both hands 05/23/2020   Primary osteoarthritis of both feet 05/23/2020   Sjogren's syndrome (HCC) 05/23/2020   Other secondary scoliosis, lumbar region 12/22/2019   Spondylosis without myelopathy or radiculopathy, lumbar region 12/22/2019   Cervical radiculopathy 10/18/2019   Numbness 09/21/2019   Bilateral carpal tunnel syndrome 09/21/2019   High risk medication use 09/21/2019   Memory loss 09/21/2019   Essential hypertension 08/25/2018   Abnormal SPEP 02/19/2018    Hand pain 02/20/2017   Multiple joint pain 02/18/2017   Disturbed cognition 06/25/2016   Impaired cognition 06/25/2016   Sciatica, right side 10/30/2015   Chronic fatigue 08/04/2014   Gait disturbance 08/04/2014   Unspecified visual disturbance 01/31/2013   Transient vision disturbance 01/31/2013   Colon polyps 11/03/2011   POSTHERPETIC NEURALGIA 10/03/2009   DISPLCMT LUMBAR INTERVERT DISC W/O MYELOPATHY 09/05/2009   RENAL CALCULUS, RECURRENT 09/04/2009   Hyperlipidemia 08/31/2009   Multiple sclerosis (HCC) 08/31/2009   ALLERGIC RHINITIS 08/31/2009  Progress Note Reporting Period 10/02/2023 to 12/18/2023  See note below for Objective Data and Assessment of Progress/Goals.     PCP: Roxan Feast NP  REFERRING PROVIDER: Dr Darryle Running   REFERRING DIAG: Low Back Pain   Rationale for Evaluation and Treatment: Rehabilitation  THERAPY DIAG:  Difficulty in walking, not elsewhere classified  Other low back pain  Muscle weakness (generalized)  ONSET DATE: About 11 days ago her nerve abiliation wore off.   SUBJECTIVE:                                                                                                                                                                                           SUBJECTIVE STATEMENT: The patients shoulder and hip are progressing well. She is having minor pain in her left hip but it is improving.    PERTINENT HISTORY:  Right Hip replacement 2022, Low back Pain, MS, TIA October 2023; MI, November 2022; CAD, Sjorgens syndrome  PAIN:  Are you having pain? Yes: NPRS scale: 3/10 Pain location left hip  Pain description: aching  Aggravating factors: standing and walking  Relieving factors: rest    PRECAUTIONS: None  WEIGHT BEARING RESTRICTIONS: No  FALLS:  Has patient fallen in last 6 months? 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   LUMBAR ROM:   AROM eval 5/15 10/3  Flexion Limited 50 % with pain  Mild improvement but still about 50%  Improved pain but still limited  Extension No limit     Right lateral flexion     Left lateral flexion     Right rotation Limited 50% with pain     Left rotation Limited 50% with pain      (Blank rows = not tested)  LOWER EXTREMITY ROM:     Passive  Right eval Left eval Right  Right  7/24 Right 1/2  Hip flexion 105 90 105 110 with pain at end range  100 with pain   Hip extension   10 degrees from neutral before manual today. Too neutral after stretching     Hip abduction       Hip adduction       Hip internal rotation painful Painful  Painful  Full with less pain  Painful   Hip external rotation   20 degrees with pain  45 with pain  48 with pain   Knee flexion       Knee extension       Ankle dorsiflexion       Ankle plantarflexion       Ankle inversion       Ankle eversion        (Blank rows = not tested)  LOWER EXTREMITY MMT:    MMT Right  10/2 Left 10/2 right 1/2 left 1/2 Right  3/7 Left 3/7  Right  5/30 Left  5/30  Hip flexion 15.3 23.4 16.7 23.4 17.5 20.4 14.8 17.4  Hip extension          Hip abduction 23.8 28.5 25.6 27.9 26.6 20.9 22.6 16.9  Hip adduction          Hip internal rotation          Hip external rotation          Knee flexion          Knee extension 23.8  23.6 22.6 24.8 28.8 20.7 23.8 17.3  Ankle dorsiflexion          Ankle plantarflexion          Ankle inversion          Ankle eversion           (Blank rows = not tested)  FUNCTIONAL TESTS:  5 times sit to stand: 28 sec  5/15 5 times sit to stand test 20 seconds 7/25 24 sec  10/16 17 sec 1/2 17 seconds 3/14 18 sec   3-minute walk test 150 feet without requirement of seated rest break 5/15  7/25 3 min walk test 200'   10/16 3 min walk test 379'   3/14  5/30 5x sit to stand 23 sec  3 min walk test 268 with fatigue and left hip pain   GAIT:  TODAY'S TREATMENT:  DATE:  7/18 Manual:  LAD left grade II and III  Roller to anterior hip and lateral gluteal   Neuro-re-ed:  Supine march with TA 3x10  Hip abduction with band green 3 x 10 with abdominal breathing Bridge 3 x 10 cueing for glutes squeeze and abdominal tightening  There-ex:  wand flexion 3x10  Nu-step 5 min 4 min      7/11  Neuro-re-ed:  Supine march with TA 3x10  Hip abduction with band green 3 x 10 with abdominal breathing Bridge 3 x 10 cueing for glutes squeeze and abdominal tightening  Seated row with cueing for posture 3 x 10 red Shoulder extension red band 3 x 10   TherEX LAQ 3 x 10 red band each leg Supine wand flexion 1 pound 3 x 10 Biceps curls 3x10 1 lb    6/25 Manual:  Roller to left hip.   Trigger point release to the anterior hip.  Trigger point release to left lateral hip.  Trigger point release to upper trap   Neuro-re-ed:  Supine march with TA 3x10   There-ex:  SAQ 2# 3x10  LAQ 2# 3x10    PATIENT EDUCATION:  Education details: reviewed HEP, symptom management  Person educated: Patient Education method: Explanation, Demonstration, Tactile cues, Verbal cues, and Handouts Education comprehension: verbalized understanding, returned demonstration,  verbal cues required, tactile cues required, and needs further education  HOME EXERCISE PROGRAM: Has previous HEP. Will review next visit  ASSESSMENT:  CLINICAL IMPRESSION: The patient required cuing to get a stretch with wand for her shoulder. When she did her motion improved. She continues to have pain at end range but shoulder flexion but it is improving. She had spasming in the front of her left hip. We continue to advance her exercises as tolerated. She was encouraged to continue her exercises at home.  OBJECTIVE IMPAIRMENTS: Abnormal gait, decreased activity tolerance, decreased balance, decreased endurance, decreased knowledge of use of DME, decreased mobility, difficulty walking, decreased ROM, decreased strength, increased muscle spasms, and pain.   ACTIVITY LIMITATIONS: carrying, lifting, bending, standing, squatting, sleeping, stairs, transfers, and locomotion level  PARTICIPATION LIMITATIONS: meal prep, cleaning, laundry, driving, shopping, community activity, and yard work  PERSONAL FACTORS: Right Hip replacement 2022, Low back Pain, MS, TIA October 2023; MI, November 2022; CAD, Sjorgens syndrome are also affecting patient's functional outcome. MS  REHAB POTENTIAL: Good  CLINICAL DECISION MAKING: Evolving/moderate complexity declining mobility   EVALUATION COMPLEXITY: Moderate   GOALS: Goals reviewed with patient? No  SHORT TERM GOALS: Target date: 09/29/2022    Patient will increase gross bilateral lower extremity strength by 5 pounds Baseline: Goal status: has lost strength 2nd to medical difficulties 5/30  2.  Patient will increase lumbar flexion by 10 degrees Baseline:  Goal status:  not tested today 2nd to pain 5/30   3.  Patient will be independent with basic after exercise program and stretching program Baseline:  Goal status: working her back towards exercises    LONG TERM GOALS: Target date: 09/29/2022     Patient will stand for greater than 30  minutes without increased pain Baseline:  Goal status: limited by left hip pain today 5/30  2.  Patient will pick item up off the floor without increased low back pain Baseline:  Goal status:met back improved 4/29    3.  Patient will ambulate community distances without report of increased low back pain Baseline:  Goal status:limited by pain but maintaining 7/25  4.  Patient will report improved  ability to sleep through the night secondary to back pain Baseline:  Goal status: no pain 5/30    PLAN:  PT FREQUENCY: 1x/week  PT DURATION: 10 weeks   PLANNED INTERVENTIONS: Therapeutic exercises, Therapeutic activity, Neuromuscular re-education, Balance training, Gait training, Patient/Family education, Self Care, Joint mobilization, Stair training, DME instructions, Aquatic Therapy, Dry Needling, Spinal mobilization, Cryotherapy, Moist heat, Ionotophoresis 4mg /ml Dexamethasone , and Manual therapy.  PLAN FOR NEXT SESSION: Consider manual therapy to the back and hips, consider trigger point dry needling to back and hips.  Reviewed basic HEP.  Begin active strengthening of hips and core.  Begin gait training and functional mobility training. Progress back into exercises.        Alm JINNY Don, PT 10/28/2022, 8:25 AM   I

## 2024-02-05 ENCOUNTER — Encounter (HOSPITAL_BASED_OUTPATIENT_CLINIC_OR_DEPARTMENT_OTHER): Payer: Self-pay | Admitting: Physical Therapy

## 2024-02-11 ENCOUNTER — Encounter (HOSPITAL_BASED_OUTPATIENT_CLINIC_OR_DEPARTMENT_OTHER): Payer: Self-pay | Admitting: Physical Therapy

## 2024-02-11 ENCOUNTER — Ambulatory Visit (HOSPITAL_BASED_OUTPATIENT_CLINIC_OR_DEPARTMENT_OTHER): Admitting: Physical Therapy

## 2024-02-11 DIAGNOSIS — M6281 Muscle weakness (generalized): Secondary | ICD-10-CM

## 2024-02-11 DIAGNOSIS — M5459 Other low back pain: Secondary | ICD-10-CM

## 2024-02-11 DIAGNOSIS — E785 Hyperlipidemia, unspecified: Secondary | ICD-10-CM | POA: Diagnosis not present

## 2024-02-11 DIAGNOSIS — R262 Difficulty in walking, not elsewhere classified: Secondary | ICD-10-CM

## 2024-02-12 ENCOUNTER — Ambulatory Visit (HOSPITAL_BASED_OUTPATIENT_CLINIC_OR_DEPARTMENT_OTHER): Payer: Self-pay | Admitting: Family

## 2024-02-12 LAB — LIPID PANEL
Chol/HDL Ratio: 4.2 ratio (ref 0.0–4.4)
Cholesterol, Total: 199 mg/dL (ref 100–199)
HDL: 47 mg/dL (ref >39)
LDL Chol Calc (NIH): 124 mg/dL — ABNORMAL HIGH (ref 0–99)
Triglycerides: 159 mg/dL — ABNORMAL HIGH (ref 0–149)
VLDL Cholesterol Cal: 28 mg/dL (ref 5–40)

## 2024-02-12 NOTE — Therapy (Signed)
 OUTPATIENT PHYSICAL THERAPY THORACOLUMBAR Treatment/Progress note    Patient Name: Dawn [name] MRN: [id number] DOB:18-Nov-1945, 78 y.o., female Today's Date: 02/12/2024  END OF SESSION:  PT End of Session - 02/11/24 1244     Visit Number 63    Number of Visits 66    Date for PT Re-Evaluation 02/26/24    Authorization Type Progress note done at 57    PT Start Time 1230    PT Stop Time 1312    PT Time Calculation (min) 42 min    Activity Tolerance Patient tolerated treatment well;No increased pain    Behavior During Therapy [facility] for tasks assessed/performed                          Past Medical History:  Diagnosis Date   Allergy    CAD (coronary artery disease)    s/p DES to RCA in 06/2021   Dry eyes    Endometrial polyp    Essential hypertension 08/25/2018   GERD (gastroesophageal reflux disease)    History of hiatal hernia    History of kidney stones    Hot flashes, menopausal 10/13/2011   Estradiol  started    Hyperlipidemia    Jaundice as teenager   no problems since   Memory loss 09/21/2019     1/2 of feet numb all the time   Multiple sclerosis (HCC) dx 2001   Neuropathy    bilateral feet   Osteoarthritis    Sjogren's syndrome (HCC) dx oct 2021   sore muscvles, dry mouth and eyes   Stroke (HCC)    Vertigo    Vision abnormalities    Past Surgical History:  Procedure Laterality Date   ABDOMINAL HYSTERECTOMY  2002   partial   COLONOSCOPY  01/27/2022   2 day prep   colonscopy  2011   CORONARY STENT INTERVENTION N/A 07/18/2021   Procedure: CORONARY STENT INTERVENTION;  Surgeon: Dann Candyce RAMAN, MD;  Location: [facility];  Service: Cardiovascular;  Laterality: N/A;   DILATATION & CURETTAGE/HYSTEROSCOPY WITH MYOSURE N/A 06/08/2020   Procedure: DILATATION & CURETTAGE/HYSTEROSCOPY/Polypectomy WITH MYOSURE;  Surgeon: Rendell Calton DELENA, DO;  Location: [facility];  Service: Gynecology;  Laterality: N/A;   LEFT HEART  CATH AND CORONARY ANGIOGRAPHY N/A 07/18/2021   Procedure: LEFT HEART CATH AND CORONARY ANGIOGRAPHY;  Surgeon: Dann Candyce RAMAN, MD;  Location: [facility];  Service: Cardiovascular;  Laterality: N/A;   LUMBAR FUSION  2001   TOTAL HIP ARTHROPLASTY Right 01/17/2021   Procedure: TOTAL HIP ARTHROPLASTY ANTERIOR APPROACH;  Surgeon: Fidel Rogue, MD;  Location: [facility];  Service: Orthopedics;  Laterality: Right;   UPPER GI ENDOSCOPY  yrs ago   Patient Active Problem List   Diagnosis Date Noted   History of gross hematuria 11/02/2023   Constipation 11/02/2023   Chronic diastolic congestive heart failure (HCC) 11/02/2023   UTI (urinary tract infection) 07/09/2023   OAB (overactive bladder) 07/03/2023   Statin myopathy 06/26/2023   History of CVA (cerebrovascular accident) 06/26/2023   Rheumatoid arthritis involving right hand, unspecified whether rheumatoid factor present (HCC) 05/22/2023   Atherosclerotic heart disease of native coronary artery with other forms of angina pectoris (HCC) 05/22/2023   Positive colorectal cancer screening using Cologuard test 11/14/2021   Coronary artery disease    Statin intolerance 04/19/2021   Osteoarthritis of right hip 01/17/2021   Status post THR (total hip replacement) 01/17/2021   Chronic bilateral low back pain  without sciatica 10/02/2020   Chronic pain syndrome 06/20/2020   Post laminectomy syndrome 06/20/2020   Sjogren's disease (HCC) 05/23/2020   Primary osteoarthritis of both hands 05/23/2020   Primary osteoarthritis of both feet 05/23/2020   Sjogren's syndrome (HCC) 05/23/2020   Other secondary scoliosis, lumbar region 12/22/2019   Spondylosis without myelopathy or radiculopathy, lumbar region 12/22/2019   Cervical radiculopathy 10/18/2019   Numbness 09/21/2019   Bilateral carpal tunnel syndrome 09/21/2019   High risk medication use 09/21/2019   Memory loss 09/21/2019   Essential hypertension 08/25/2018   Abnormal SPEP 02/19/2018    Hand pain 02/20/2017   Multiple joint pain 02/18/2017   Disturbed cognition 06/25/2016   Impaired cognition 06/25/2016   Sciatica, right side 10/30/2015   Chronic fatigue 08/04/2014   Gait disturbance 08/04/2014   Unspecified visual disturbance 01/31/2013   Transient vision disturbance 01/31/2013   Colon polyps 11/03/2011   POSTHERPETIC NEURALGIA 10/03/2009   DISPLCMT LUMBAR INTERVERT DISC W/O MYELOPATHY 09/05/2009   RENAL CALCULUS, RECURRENT 09/04/2009   Hyperlipidemia 08/31/2009   Multiple sclerosis (HCC) 08/31/2009   ALLERGIC RHINITIS 08/31/2009  Progress Note Reporting Period 10/02/2023 to 12/18/2023  See note below for Objective Data and Assessment of Progress/Goals.     PCP: Roxan Feast NP  REFERRING PROVIDER: Dr Darryle Running   REFERRING DIAG: Low Back Pain   Rationale for Evaluation and Treatment: Rehabilitation  THERAPY DIAG:  Difficulty in walking, not elsewhere classified  Other low back pain  Muscle weakness (generalized)  ONSET DATE: About 11 days ago her nerve abiliation wore off.   SUBJECTIVE:                                                                                                                                                                                           SUBJECTIVE STATEMENT: She still has minor pain in her shoulder and hip but both are progressing well.    PERTINENT HISTORY:  Right Hip replacement 2022, Low back Pain, MS, TIA October 2023; MI, November 2022; CAD, Sjorgens syndrome  PAIN:  Are you having pain? Yes: NPRS scale: 3/10 Pain location left hip  Pain description: aching  Aggravating factors: standing and walking  Relieving factors: rest    PRECAUTIONS: None  WEIGHT BEARING RESTRICTIONS: No  FALLS:  Has patient fallen in last 6 months? 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   LUMBAR ROM:   AROM eval 5/15 10/3  Flexion Limited 50 % with pain  Mild improvement but still about 50%  Improved pain but still limited  Extension No limit     Right lateral flexion     Left lateral flexion     Right rotation Limited 50% with pain     Left rotation Limited 50% with pain      (Blank rows = not tested)  LOWER EXTREMITY ROM:     Passive  Right eval Left eval Right  Right  7/24 Right 1/2  Hip flexion 105 90 105 110 with pain at end range  100 with pain   Hip extension   10 degrees from neutral before manual today. Too neutral after stretching     Hip abduction       Hip adduction       Hip internal rotation painful Painful  Painful  Full with less pain  Painful   Hip external rotation   20 degrees with pain  45 with pain  48 with pain   Knee flexion       Knee extension       Ankle dorsiflexion       Ankle plantarflexion       Ankle inversion       Ankle eversion        (Blank rows = not tested)  LOWER EXTREMITY MMT:    MMT Right  10/2 Left 10/2 right 1/2 left 1/2 Right  3/7 Left 3/7  Right  5/30 Left  5/30  Hip flexion 15.3 23.4 16.7 23.4 17.5 20.4 14.8 17.4  Hip extension          Hip abduction 23.8 28.5 25.6 27.9 26.6 20.9 22.6 16.9  Hip adduction          Hip internal rotation          Hip external rotation          Knee flexion          Knee extension 23.8 23.6 22.6 24.8 28.8 20.7 23.8 17.3   Ankle dorsiflexion          Ankle plantarflexion          Ankle inversion          Ankle eversion           (Blank rows = not tested)  FUNCTIONAL TESTS:  5 times sit to stand: 28 sec  5/15 5 times sit to stand test 20 seconds 7/25 24 sec  10/16 17 sec 1/2 17 seconds 3/14 18 sec   3-minute walk test 150 feet without requirement of seated rest break 5/15  7/25 3 min walk test 200'   10/16 3 min walk test 379'   3/14  5/30 5x sit to stand 23 sec  3 min walk test 268 with fatigue and left hip pain   GAIT:  TODAY'S TREATMENT:  DATE:   7/25  There-ex: want flexion 3x10  LTR 3x15  SAQ 3x15 each leg 2 lbs  LAQ 3x15 2lbs   Neuro--re-ed Supine ABC 3x with right  Supine march with TA breathing 3x10    7/18 Manual:  LAD left grade II and III  Roller to anterior hip and lateral gluteal   Neuro-re-ed:  Supine march with TA 3x10  Hip abduction with band green 3 x 10 with abdominal breathing Bridge 3 x 10 cueing for glutes squeeze and abdominal tightening  There-ex:  wand flexion 3x10  Nu-step 5 min 4 min      7/11  Neuro-re-ed:  Supine march with TA 3x10  Hip abduction with band green 3 x 10 with abdominal breathing Bridge 3 x 10 cueing for glutes squeeze and abdominal tightening  Seated row with cueing for posture 3 x 10 red Shoulder extension red band 3 x 10   TherEX LAQ 3 x 10 red band each leg Supine wand flexion 1 pound 3 x 10 Biceps curls 3x10 1 lb    6/25 Manual:  Roller to left hip.   Trigger point release to the anterior hip.  Trigger point release to left lateral hip.  Trigger point release to upper trap   Neuro-re-ed:  Supine march with TA 3x10   There-ex:  SAQ 2# 3x10  LAQ 2# 3x10    PATIENT EDUCATION:  Education details: reviewed HEP, symptom management  Person educated: Patient Education method:  Explanation, Demonstration, Tactile cues, Verbal cues, and Handouts Education comprehension: verbalized understanding, returned demonstration, verbal cues required, tactile cues required, and needs further education  HOME EXERCISE PROGRAM: Has previous HEP. Will review next visit  ASSESSMENT:  CLINICAL IMPRESSION: Therapy advabced the patieints shoulder exercises today. Overall she is rpogressing. She continue to require skilled therapy to advance her weights and intensity of her exercises. She is walking better at this time .   OBJECTIVE IMPAIRMENTS: Abnormal gait, decreased activity tolerance, decreased balance, decreased endurance, decreased knowledge of use of DME, decreased mobility, difficulty walking, decreased ROM, decreased strength, increased muscle spasms, and pain.   ACTIVITY LIMITATIONS: carrying, lifting, bending, standing, squatting, sleeping, stairs, transfers, and locomotion level  PARTICIPATION LIMITATIONS: meal prep, cleaning, laundry, driving, shopping, community activity, and yard work  PERSONAL FACTORS: Right Hip replacement 2022, Low back Pain, MS, TIA October 2023; MI, November 2022; CAD, Sjorgens syndrome are also affecting patient's functional outcome. MS  REHAB POTENTIAL: Good  CLINICAL DECISION MAKING: Evolving/moderate complexity declining mobility   EVALUATION COMPLEXITY: Moderate   GOALS: Goals reviewed with patient? No  SHORT TERM GOALS: Target date: 09/29/2022    Patient will increase gross bilateral lower extremity strength by 5 pounds Baseline: Goal status: has lost strength 2nd to medical difficulties 5/30  2.  Patient will increase lumbar flexion by 10 degrees Baseline:  Goal status:  not tested today 2nd to pain 5/30   3.  Patient will be independent with basic after exercise program and stretching program Baseline:  Goal status: working her back towards exercises    LONG TERM GOALS: Target date: 09/29/2022     Patient will stand  for greater than 30 minutes without increased pain Baseline:  Goal status: limited by left hip pain today 5/30  2.  Patient will pick item up off the floor without increased low back pain Baseline:  Goal status:met back improved 4/29    3.  Patient will ambulate community distances without report of increased low back pain Baseline:  Goal  status:limited by pain but maintaining 7/25  4.  Patient will report improved ability to sleep through the night secondary to back pain Baseline:  Goal status: no pain 5/30    PLAN:  PT FREQUENCY: 1x/week  PT DURATION: 10 weeks   PLANNED INTERVENTIONS: Therapeutic exercises, Therapeutic activity, Neuromuscular re-education, Balance training, Gait training, Patient/Family education, Self Care, Joint mobilization, Stair training, DME instructions, Aquatic Therapy, Dry Needling, Spinal mobilization, Cryotherapy, Moist heat, Ionotophoresis 4mg /ml Dexamethasone , and Manual therapy.  PLAN FOR NEXT SESSION: Consider manual therapy to the back and hips, consider trigger point dry needling to back and hips.  Reviewed basic HEP.  Begin active strengthening of hips and core.  Begin gait training and functional mobility training. Progress back into exercises.        Alm JINNY Don, PT 10/28/2022, 12:44 PM   I

## 2024-02-19 ENCOUNTER — Encounter (HOSPITAL_BASED_OUTPATIENT_CLINIC_OR_DEPARTMENT_OTHER): Admitting: Physical Therapy

## 2024-03-02 ENCOUNTER — Ambulatory Visit (HOSPITAL_BASED_OUTPATIENT_CLINIC_OR_DEPARTMENT_OTHER): Attending: Physical Medicine and Rehabilitation | Admitting: Physical Therapy

## 2024-03-02 DIAGNOSIS — R262 Difficulty in walking, not elsewhere classified: Secondary | ICD-10-CM | POA: Insufficient documentation

## 2024-03-02 DIAGNOSIS — R252 Cramp and spasm: Secondary | ICD-10-CM | POA: Insufficient documentation

## 2024-03-02 DIAGNOSIS — M5459 Other low back pain: Secondary | ICD-10-CM | POA: Diagnosis not present

## 2024-03-02 DIAGNOSIS — M6281 Muscle weakness (generalized): Secondary | ICD-10-CM | POA: Diagnosis not present

## 2024-03-02 NOTE — Therapy (Signed)
 OUTPATIENT PHYSICAL THERAPY THORACOLUMBAR Treatment/Progress note    Patient Name: Dawn [name] MRN: [id number] DOB:[birth year], 78 y.o., female Today's Date: 03/02/2024  END OF SESSION:  PT End of Session - 03/02/24 1022     Visit Number 64    Number of Visits 74    Date for PT Re-Evaluation 04/27/24    Authorization Type Progress note done at 57    PT Start Time 1019    PT Stop Time 1100    PT Time Calculation (min) 41 min    Activity Tolerance Patient tolerated treatment well;No increased pain    Behavior During Therapy [facility] for tasks assessed/performed                          Past Medical History:  Diagnosis Date   Allergy    CAD (coronary artery disease)    s/p DES to RCA in 06/2021   Dry eyes    Endometrial polyp    Essential hypertension 08/25/2018   GERD (gastroesophageal reflux disease)    History of hiatal hernia    History of kidney stones    Hot flashes, menopausal 10/13/2011   Estradiol  started    Hyperlipidemia    Jaundice as teenager   no problems since   Memory loss 09/21/2019     1/2 of feet numb all the time   Multiple sclerosis (HCC) dx 2001   Neuropathy    bilateral feet   Osteoarthritis    Sjogren's syndrome (HCC) dx oct 2021   sore muscvles, dry mouth and eyes   Stroke (HCC)    Vertigo    Vision abnormalities    Past Surgical History:  Procedure Laterality Date   ABDOMINAL HYSTERECTOMY  2002   partial   COLONOSCOPY  01/27/2022   2 day prep   colonscopy  2011   CORONARY STENT INTERVENTION N/A 07/18/2021   Procedure: CORONARY STENT INTERVENTION;  Surgeon: Dann Candyce RAMAN, MD;  Location: [facility];  Service: Cardiovascular;  Laterality: N/A;   DILATATION & CURETTAGE/HYSTEROSCOPY WITH MYOSURE N/A 06/08/2020   Procedure: DILATATION & CURETTAGE/HYSTEROSCOPY/Polypectomy WITH MYOSURE;  Surgeon: Rendell Calton DELENA, DO;  Location: [facility];  Service: Gynecology;  Laterality: N/A;   LEFT HEART  CATH AND CORONARY ANGIOGRAPHY N/A 07/18/2021   Procedure: LEFT HEART CATH AND CORONARY ANGIOGRAPHY;  Surgeon: Dann Candyce RAMAN, MD;  Location: [facility]IVE CV LAB;  Service: Cardiovascular;  Laterality: N/A;   LUMBAR FUSION  2001   TOTAL HIP ARTHROPLASTY Right 01/17/2021   Procedure: TOTAL HIP ARTHROPLASTY ANTERIOR APPROACH;  Surgeon: Fidel Rogue, MD;  Location: [facility];  Service: Orthopedics;  Laterality: Right;   UPPER GI ENDOSCOPY  yrs ago   Patient Active Problem List   Diagnosis Date Noted   History of gross hematuria 11/02/2023   Constipation 11/02/2023   Chronic diastolic congestive heart failure (HCC) 11/02/2023   UTI (urinary tract infection) 07/09/2023   OAB (overactive bladder) 07/03/2023   Statin myopathy 06/26/2023   History of CVA (cerebrovascular accident) 06/26/2023   Rheumatoid arthritis involving right hand, unspecified whether rheumatoid factor present (HCC) 05/22/2023   Atherosclerotic heart disease of native coronary artery with other forms of angina pectoris (HCC) 05/22/2023   Positive colorectal cancer screening using Cologuard test 11/14/2021   Coronary artery disease    Statin intolerance 04/19/2021   Osteoarthritis of right hip 01/17/2021   Status post THR (total hip replacement) 01/17/2021   Chronic bilateral low back pain  without sciatica 10/02/2020   Chronic pain syndrome 06/20/2020   Post laminectomy syndrome 06/20/2020   Sjogren's disease (HCC) 05/23/2020   Primary osteoarthritis of both hands 05/23/2020   Primary osteoarthritis of both feet 05/23/2020   Sjogren's syndrome (HCC) 05/23/2020   Other secondary scoliosis, lumbar region 12/22/2019   Spondylosis without myelopathy or radiculopathy, lumbar region 12/22/2019   Cervical radiculopathy 10/18/2019   Numbness 09/21/2019   Bilateral carpal tunnel syndrome 09/21/2019   High risk medication use 09/21/2019   Memory loss 09/21/2019   Essential hypertension 08/25/2018   Abnormal SPEP 02/19/2018    Hand pain 02/20/2017   Multiple joint pain 02/18/2017   Disturbed cognition 06/25/2016   Impaired cognition 06/25/2016   Sciatica, right side 10/30/2015   Chronic fatigue 08/04/2014   Gait disturbance 08/04/2014   Unspecified visual disturbance 01/31/2013   Transient vision disturbance 01/31/2013   Colon polyps 11/03/2011   POSTHERPETIC NEURALGIA 10/03/2009   DISPLCMT LUMBAR INTERVERT DISC W/O MYELOPATHY 09/05/2009   RENAL CALCULUS, RECURRENT 09/04/2009   Hyperlipidemia 08/31/2009   Multiple sclerosis (HCC) 08/31/2009   ALLERGIC RHINITIS 08/31/2009  Progress Note Reporting Period 12/18/2023 to 03/02/2024 See note below for Objective Data and Assessment of Progress/Goals.     PCP: Roxan Feast NP  REFERRING PROVIDER: Dr Darryle Running   REFERRING DIAG: Low Back Pain   Rationale for Evaluation and Treatment: Rehabilitation  THERAPY DIAG:  No diagnosis found.  ONSET DATE: About 11 days ago her nerve abiliation wore off.   SUBJECTIVE:                                                                                                                                                                                           SUBJECTIVE STATEMENT: She still has minor pain in her shoulder and hip but both are progressing well. She feels like over the last 2 weeks she has been moving better in her house.  She has been exercising consistently.  Her right hip is hurting her today.  She has been using her right shoulder for functional activity.     PERTINENT HISTORY:  Right Hip replacement 2022, Low back Pain, MS, TIA October 2023; MI, November 2022; CAD, Sjorgens syndrome  PAIN:  Are you having pain? Yes: NPRS scale: 3/10 Pain location left hip  Pain description: aching  Aggravating factors: standing and walking  Relieving factors: rest    PRECAUTIONS: None  WEIGHT BEARING RESTRICTIONS: No  FALLS:  Has patient fallen in last 6 months? 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   LUMBAR ROM:   AROM eval 5/15 10/3  Flexion Limited 50 % with pain  Mild improvement but still about 50%  Improved pain but still limited  Extension No limit     Right lateral flexion     Left lateral flexion     Right rotation Limited 50% with pain     Left rotation Limited 50% with pain      (Blank rows = not tested)  LOWER EXTREMITY ROM:     Passive  Right eval Left eval Right  Right  7/24 Right 1/2  Hip flexion 105 90 105 110 with pain at end range  100 with pain   Hip extension   10 degrees from neutral before manual today. Too neutral after stretching     Hip abduction       Hip adduction       Hip internal rotation painful Painful  Painful  Full with less pain  Painful   Hip external rotation   20 degrees with pain  45 with pain  48 with pain   Knee flexion       Knee extension       Ankle dorsiflexion       Ankle plantarflexion       Ankle inversion       Ankle eversion        (Blank rows = not tested)  LOWER EXTREMITY MMT:    MMT Right  10/2 Left 10/2 Right  3/7 Left 3/7  Right  5/30 Left  5/30 Right  8/13 Left  8/13   Hip flexion 15.3 23.4 17.5 20.4 14.8 17.4 18.8 15.5  Hip extension          Hip abduction 23.8 28.5 26.6 20.9 22.6 16.9 24.5 23.5  Hip adduction          Hip  internal rotation          Hip external rotation          Knee flexion          Knee extension 23.8 23.6 28.8 20.7 23.8 17.3 28.2 30.8  Ankle dorsiflexion          Ankle plantarflexion          Ankle inversion          Ankle eversion           (Blank rows = not tested)   LEFS 19/80  FUNCTIONAL TESTS:  5 times sit to stand: 28 sec  5/15 5 times sit to stand test 20 seconds 7/25 24 sec  10/16 17 sec 1/2 17 seconds 3/14 18 sec   3-minute walk test 150 feet without requirement of seated rest break 5/15  7/25 3 min walk test 200'   10/16 3 min walk test 379'   3/14  5/30 5x sit to stand 23 sec  3 min walk test 268 with fatigue and  left hip pain  8/13 5x sit to stand 15 sec  3 min walk test 415 ft       GAIT:  TODAY'S TREATMENT:                                                                                                                              DATE:  8/13 Manual:  LAD left grade II and III  Roller to anterior hip and lateral gluteal   There ex: Hip abduction 3 x 10 Green Long arc quad 3 x 10 red Seated march 3 x 10  7/25  There-ex: want flexion 3x10  LTR 3x15  SAQ 3x15 each leg 2 lbs  LAQ 3x15 2lbs   Neuro--re-ed Supine ABC 3x with right  Supine march with TA breathing 3x10    7/18 Manual:  LAD left grade II and III  Roller to anterior hip and lateral gluteal   Neuro-re-ed:  Supine march with TA 3x10  Hip abduction with band green 3 x 10 with abdominal breathing Bridge 3 x 10 cueing for glutes squeeze and abdominal tightening  There-ex:  wand flexion 3x10  Nu-step 5 min 4 min      7/11  Neuro-re-ed:  Supine march with TA 3x10  Hip abduction with band green 3 x 10 with abdominal breathing Bridge 3 x 10 cueing for glutes squeeze and abdominal tightening  Seated row with cueing for posture 3 x 10 red Shoulder extension red band 3 x 10   TherEX LAQ 3 x 10 red band each leg Supine wand flexion 1 pound 3 x 10 Biceps curls  3x10 1 lb    6/25 Manual:  Roller to left hip.   Trigger point release to the anterior hip.  Trigger point release to left lateral hip.  Trigger point release to upper trap   Neuro-re-ed:  Supine march with TA 3x10   There-ex:  SAQ 2# 3x10  LAQ 2# 3x10    PATIENT EDUCATION:  Education details: reviewed HEP, symptom management  Person educated: Patient Education method: Explanation, Demonstration, Tactile cues, Verbal cues, and Handouts Education comprehension: verbalized understanding, returned demonstration, verbal cues required, tactile cues required, and needs further education  HOME EXERCISE PROGRAM: Has previous HEP. Will review next visit  ASSESSMENT:  CLINICAL IMPRESSION: The patient continues to make progress.  Her 6-minute walk test improved.  Her strength is improved in most planes.  Her pain in her left hip has resolved.  She is having pain in her right hip that is improved as well.  She continues to have minor pain in her shoulder but that is improved as well.  Overall she would benefit from further skilled therapy 1 W10.  Therapy will continue to advance as tolerated.  See below for goal specific progress. OBJECTIVE IMPAIRMENTS: Abnormal gait, decreased activity tolerance, decreased balance, decreased endurance, decreased knowledge of use of DME, decreased mobility, difficulty walking, decreased ROM, decreased strength, increased muscle spasms, and  pain.   ACTIVITY LIMITATIONS: carrying, lifting, bending, standing, squatting, sleeping, stairs, transfers, and locomotion level  PARTICIPATION LIMITATIONS: meal prep, cleaning, laundry, driving, shopping, community activity, and yard work  PERSONAL FACTORS: Right Hip replacement 2022, Low back Pain, MS, TIA October 2023; MI, November 2022; CAD, Sjorgens syndrome are also affecting patient's functional outcome. MS  REHAB POTENTIAL: Good  CLINICAL DECISION MAKING: Evolving/moderate complexity declining mobility    EVALUATION COMPLEXITY: Moderate   GOALS: Goals reviewed with patient? No  SHORT TERM GOALS: Target date: 09/29/2022    Patient will increase gross bilateral lower extremity strength by 5 pounds Baseline: Goal status: has lost strength 2nd to medical difficulties 5/30  2.  Patient will increase lumbar flexion by 10 degrees Baseline:  Goal status:  not tested today 2nd to pain 5/30   3.  Patient will be independent with basic after exercise program and stretching program Baseline:  Goal status: working her back towards exercises   4.  Patient will increase LEFS score by 10 points Goal status: New 8/13 5.  Patient will reduce 5 times sit to stand test time by 5 seconds Goal status new 813   LONG TERM GOALS: Target date: 09/29/2022     Patient will stand for greater than 30 minutes without increased pain Baseline:  Goal status: limited by left hip pain today 5/30  2.  Patient will pick item up off the floor without increased low back pain Baseline:  Goal status:met back improved 4/29    3.  Patient will ambulate community distances without report of increased low back pain Baseline:  Goal status:l Community distances 8/13  4.  Patient will report improved ability to sleep through the night secondary to back pain Baseline:  Goal status: Achieved 8/13  5.  Patient will return [facility] program [facility] without increased right hip pain Goal status: New 8/13    PLAN:  PT FREQUENCY: 1x/week  PT DURATION: 10 weeks   PLANNED INTERVENTIONS: Therapeutic exercises, Therapeutic activity, Neuromuscular re-education, Balance training, Gait training, Patient/Family education, Self Care, Joint mobilization, Stair training, DME instructions, Aquatic Therapy, Dry Needling, Spinal mobilization, Cryotherapy, Moist heat, Ionotophoresis 4mg /ml Dexamethasone , and Manual therapy.  PLAN FOR NEXT SESSION: Consider manual therapy to the back and hips, consider trigger point dry  needling to back and hips.  Reviewed basic HEP.  Begin active strengthening of hips and core.  Begin gait training and functional mobility training. Progress back into exercises.  Continue with balance training       Alm JINNY Don, PT 10/28/2022, 10:23 AM   I

## 2024-03-03 ENCOUNTER — Encounter: Admitting: Physical Therapy

## 2024-03-03 ENCOUNTER — Encounter (HOSPITAL_BASED_OUTPATIENT_CLINIC_OR_DEPARTMENT_OTHER): Payer: Self-pay | Admitting: Physical Therapy

## 2024-03-08 ENCOUNTER — Ambulatory Visit (HOSPITAL_BASED_OUTPATIENT_CLINIC_OR_DEPARTMENT_OTHER): Admitting: Physical Therapy

## 2024-03-08 ENCOUNTER — Encounter (HOSPITAL_BASED_OUTPATIENT_CLINIC_OR_DEPARTMENT_OTHER): Payer: Self-pay | Admitting: Physical Therapy

## 2024-03-08 DIAGNOSIS — M5459 Other low back pain: Secondary | ICD-10-CM

## 2024-03-08 DIAGNOSIS — M6281 Muscle weakness (generalized): Secondary | ICD-10-CM

## 2024-03-08 DIAGNOSIS — R252 Cramp and spasm: Secondary | ICD-10-CM | POA: Diagnosis not present

## 2024-03-08 DIAGNOSIS — R262 Difficulty in walking, not elsewhere classified: Secondary | ICD-10-CM

## 2024-03-08 NOTE — Therapy (Signed)
 OUTPATIENT PHYSICAL THERAPY THORACOLUMBAR Treatment/Progress note    Patient Name: Dawn [name] MRN: [id number] DOB:[birth year], 78 y.o., female Today's Date: 03/09/2024  END OF SESSION:  PT End of Session - 03/08/24 1350     Visit Number 65    Number of Visits 74    Date for PT Re-Evaluation 05/12/24    Authorization Type Progress note done at 64    PT Start Time 1345    PT Stop Time 1428    PT Time Calculation (min) 43 min    Activity Tolerance Patient tolerated treatment well;No increased pain    Behavior During Therapy [facility] for tasks assessed/performed                           Past Medical History:  Diagnosis Date   Allergy    CAD (coronary artery disease)    s/p DES to RCA in 06/2021   Dry eyes    Endometrial polyp    Essential hypertension 08/25/2018   GERD (gastroesophageal reflux disease)    History of hiatal hernia    History of kidney stones    Hot flashes, menopausal 10/13/2011   Estradiol  started    Hyperlipidemia    Jaundice as teenager   no problems since   Memory loss 09/21/2019     1/2 of feet numb all the time   Multiple sclerosis (HCC) dx 2001   Neuropathy    bilateral feet   Osteoarthritis    Sjogren's syndrome (HCC) dx oct 2021   sore muscvles, dry mouth and eyes   Stroke (HCC)    Vertigo    Vision abnormalities    Past Surgical History:  Procedure Laterality Date   ABDOMINAL HYSTERECTOMY  2002   partial   COLONOSCOPY  01/27/2022   2 day prep   colonscopy  2011   CORONARY STENT INTERVENTION N/A 07/18/2021   Procedure: CORONARY STENT INTERVENTION;  Surgeon: Dann Candyce RAMAN, MD;  Location: [facility];  Service: Cardiovascular;  Laterality: N/A;   DILATATION & CURETTAGE/HYSTEROSCOPY WITH MYOSURE N/A 06/08/2020   Procedure: DILATATION & CURETTAGE/HYSTEROSCOPY/Polypectomy WITH MYOSURE;  Surgeon: Rendell Calton DELENA, DO;  Location: [facility];  Service: Gynecology;  Laterality: N/A;   LEFT HEART  CATH AND CORONARY ANGIOGRAPHY N/A 07/18/2021   Procedure: LEFT HEART CATH AND CORONARY ANGIOGRAPHY;  Surgeon: Dann Candyce RAMAN, MD;  Location: [facility] INVASIVE CV LAB;  Service: Cardiovascular;  Laterality: N/A;   LUMBAR FUSION  2001   TOTAL HIP ARTHROPLASTY Right 01/17/2021   Procedure: TOTAL HIP ARTHROPLASTY ANTERIOR APPROACH;  Surgeon: Fidel Rogue, MD;  Location: [facility];  Service: Orthopedics;  Laterality: Right;   UPPER GI ENDOSCOPY  yrs ago   Patient Active Problem List   Diagnosis Date Noted   History of gross hematuria 11/02/2023   Constipation 11/02/2023   Chronic diastolic congestive heart failure (HCC) 11/02/2023   UTI (urinary tract infection) 07/09/2023   OAB (overactive bladder) 07/03/2023   Statin myopathy 06/26/2023   History of CVA (cerebrovascular accident) 06/26/2023   Rheumatoid arthritis involving right hand, unspecified whether rheumatoid factor present (HCC) 05/22/2023   Atherosclerotic heart disease of native coronary artery with other forms of angina pectoris (HCC) 05/22/2023   Positive colorectal cancer screening using Cologuard test 11/14/2021   Coronary artery disease    Statin intolerance 04/19/2021   Osteoarthritis of right hip 01/17/2021   Status post THR (total hip replacement) 01/17/2021   Chronic bilateral low back  pain without sciatica 10/02/2020   Chronic pain syndrome 06/20/2020   Post laminectomy syndrome 06/20/2020   Sjogren's disease (HCC) 05/23/2020   Primary osteoarthritis of both hands 05/23/2020   Primary osteoarthritis of both feet 05/23/2020   Sjogren's syndrome (HCC) 05/23/2020   Other secondary scoliosis, lumbar region 12/22/2019   Spondylosis without myelopathy or radiculopathy, lumbar region 12/22/2019   Cervical radiculopathy 10/18/2019   Numbness 09/21/2019   Bilateral carpal tunnel syndrome 09/21/2019   High risk medication use 09/21/2019   Memory loss 09/21/2019   Essential hypertension 08/25/2018   Abnormal SPEP 02/19/2018    Hand pain 02/20/2017   Multiple joint pain 02/18/2017   Disturbed cognition 06/25/2016   Impaired cognition 06/25/2016   Sciatica, right side 10/30/2015   Chronic fatigue 08/04/2014   Gait disturbance 08/04/2014   Unspecified visual disturbance 01/31/2013   Transient vision disturbance 01/31/2013   Colon polyps 11/03/2011   POSTHERPETIC NEURALGIA 10/03/2009   DISPLCMT LUMBAR INTERVERT DISC W/O MYELOPATHY 09/05/2009   RENAL CALCULUS, RECURRENT 09/04/2009   Hyperlipidemia 08/31/2009   Multiple sclerosis (HCC) 08/31/2009   ALLERGIC RHINITIS 08/31/2009  Progress Note Reporting Period 12/18/2023 to 03/02/2024 See note below for Objective Data and Assessment of Progress/Goals.     PCP: Roxan Feast NP  REFERRING PROVIDER: Dr Darryle Running   REFERRING DIAG: Low Back Pain   Rationale for Evaluation and Treatment: Rehabilitation  THERAPY DIAG:  Difficulty in walking, not elsewhere classified  Other low back pain  Muscle weakness (generalized)  Cramp and spasm  ONSET DATE: About 11 days ago her nerve abiliation wore off.   SUBJECTIVE:                                                                                                                                                                                           SUBJECTIVE STATEMENT: The patient reports both hips have been a little sore.   PERTINENT HISTORY:  Right Hip replacement 2022, Low back Pain, MS, TIA October 2023; MI, November 2022; CAD, Sjorgens syndrome  PAIN:  Are you having pain? Yes: NPRS scale: 3/10 Pain location left hip  Pain description: aching  Aggravating factors: standing and walking  Relieving factors: rest    PRECAUTIONS: None  WEIGHT BEARING RESTRICTIONS: No  FALLS:  Has patient fallen in last 6 months? 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   LUMBAR ROM:   AROM eval 5/15 10/3  Flexion Limited 50 % with pain  Mild improvement but still about 50%  Improved pain but still limited  Extension No limit     Right lateral flexion     Left lateral flexion     Right rotation Limited 50% with pain     Left rotation Limited 50% with pain      (Blank rows = not tested)  LOWER EXTREMITY ROM:     Passive  Right eval Left eval Right  Right  7/24 Right 1/2  Hip flexion 105 90 105 110 with pain at end range  100 with pain   Hip extension   10 degrees from neutral before manual today. Too neutral after stretching     Hip abduction       Hip adduction       Hip internal rotation painful Painful  Painful  Full with less pain  Painful   Hip external rotation   20 degrees with pain  45 with pain  48 with pain   Knee flexion       Knee extension       Ankle dorsiflexion       Ankle plantarflexion       Ankle inversion       Ankle eversion        (Blank rows = not tested)  LOWER EXTREMITY MMT:    MMT Right  10/2 Left 10/2 Right  3/7 Left 3/7  Right  5/30 Left  5/30 Right  8/13 Left  8/13   Hip flexion 15.3 23.4 17.5 20.4 14.8 17.4 18.8 15.5  Hip extension          Hip abduction 23.8 28.5 26.6 20.9 22.6 16.9 24.5 23.5  Hip adduction          Hip internal rotation          Hip external rotation          Knee flexion          Knee extension 23.8 23.6 28.8 20.7 23.8 17.3 28.2 30.8  Ankle  dorsiflexion          Ankle plantarflexion          Ankle inversion          Ankle eversion           (Blank rows = not tested)   LEFS 19/80  FUNCTIONAL TESTS:  5 times sit to stand: 28 sec  5/15 5 times sit to stand test 20 seconds 7/25 24 sec  10/16 17 sec 1/2 17 seconds 3/14 18 sec   3-minute walk test 150 feet without requirement of seated rest break 5/15  7/25 3 min walk test 200'   10/16 3 min walk test 379'   3/14  5/30 5x sit to stand 23 sec  3 min walk test 268 with fatigue and left hip pain  8/13 5x sit to stand 15 sec  3 min walk test 415 ft       GAIT:  TODAY'S TREATMENT:  DATE:  8/19 Manual:  Prone: trigger point release to lumbar spine and hips.   Neuro re-ed:  Bilateral ER 3x10 yellow  Horizontal abduction 3x10 yellow  Wand flexion with posture 3x10   Standing weight shift on air-ex 3x10  Standing march on Airex 3 x 10 each leg  8/13 Manual:  LAD left grade II and III  Roller to anterior hip and lateral gluteal   There ex: Hip abduction 3 x 10 Green Long arc quad 3 x 10 red Seated march 3 x 10  7/25  There-ex: want flexion 3x10  LTR 3x15  SAQ 3x15 each leg 2 lbs  LAQ 3x15 2lbs   Neuro--re-ed Supine ABC 3x with right  Supine march with TA breathing 3x10    7/18 Manual:  LAD left grade II and III  Roller to anterior hip and lateral gluteal   Neuro-re-ed:  Supine march with TA 3x10  Hip abduction with band green 3 x 10 with abdominal breathing Bridge 3 x 10 cueing for glutes squeeze and abdominal tightening  There-ex:  wand flexion 3x10  Nu-step 5 min 4 min        PATIENT EDUCATION:  Education details: reviewed HEP, symptom management  Person educated: Patient Education method: Explanation, Demonstration, Tactile cues, Verbal cues, and Handouts Education comprehension: verbalized  understanding, returned demonstration, verbal cues required, tactile cues required, and needs further education  HOME EXERCISE PROGRAM: Has previous HEP. Will review next visit  ASSESSMENT:  CLINICAL IMPRESSION: The patient tolerated treatment well. We focused on sitting postural exercises. She had no increase in pain in her shoulders. We focused manual therapy more ont he lumbar spine. She had tightness in bilateral paraspinals and into bilateral gluteals. We kept her out of supine because it makes her syncopal.  Therapy also worked on instability exercises with the patient.  They are do exercise on Airex mat.  She tolerated well.  OBJECTIVE IMPAIRMENTS: Abnormal gait, decreased activity tolerance, decreased balance, decreased endurance, decreased knowledge of use of DME, decreased mobility, difficulty walking, decreased ROM, decreased strength, increased muscle spasms, and pain.   ACTIVITY LIMITATIONS: carrying, lifting, bending, standing, squatting, sleeping, stairs, transfers, and locomotion level  PARTICIPATION LIMITATIONS: meal prep, cleaning, laundry, driving, shopping, community activity, and yard work  PERSONAL FACTORS: Right Hip replacement 2022, Low back Pain, MS, TIA October 2023; MI, November 2022; CAD, Sjorgens syndrome are also affecting patient's functional outcome. MS  REHAB POTENTIAL: Good  CLINICAL DECISION MAKING: Evolving/moderate complexity declining mobility   EVALUATION COMPLEXITY: Moderate   GOALS: Goals reviewed with patient? No  SHORT TERM GOALS: Target date: 09/29/2022    Patient will increase gross bilateral lower extremity strength by 5 pounds Baseline: Goal status: has lost strength 2nd to medical difficulties 5/30  2.  Patient will increase lumbar flexion by 10 degrees Baseline:  Goal status:  not tested today 2nd to pain 5/30   3.  Patient will be independent with basic after exercise program and stretching program Baseline:  Goal status:  working her back towards exercises   4.  Patient will increase LEFS score by 10 points Goal status: New 8/13 5.  Patient will reduce 5 times sit to stand test time by 5 seconds Goal status new 813   LONG TERM GOALS: Target date: 09/29/2022     Patient will stand for greater than 30 minutes without increased pain Baseline:  Goal status: limited by left hip pain today 5/30  2.  Patient will pick item up off the floor  without increased low back pain Baseline:  Goal status:met back improved 4/29    3.  Patient will ambulate community distances without report of increased low back pain Baseline:  Goal status:l Community distances 8/13  4.  Patient will report improved ability to sleep through the night secondary to back pain Baseline:  Goal status: Achieved 8/13  5.  Patient will return to gym program at [facility] without increased right hip pain Goal status: New 8/13    PLAN:  PT FREQUENCY: 1x/week  PT DURATION: 10 weeks   PLANNED INTERVENTIONS: Therapeutic exercises, Therapeutic activity, Neuromuscular re-education, Balance training, Gait training, Patient/Family education, Self Care, Joint mobilization, Stair training, DME instructions, Aquatic Therapy, Dry Needling, Spinal mobilization, Cryotherapy, Moist heat, Ionotophoresis 4mg /ml Dexamethasone , and Manual therapy.  PLAN FOR NEXT SESSION: Consider manual therapy to the back and hips, consider trigger point dry needling to back and hips.  Reviewed basic HEP.  Begin active strengthening of hips and core.  Begin gait training and functional mobility training. Progress back into exercises.  Continue with balance training       Alm JINNY Don, PT 10/28/2022, 7:17 AM   I

## 2024-03-09 ENCOUNTER — Encounter (HOSPITAL_BASED_OUTPATIENT_CLINIC_OR_DEPARTMENT_OTHER): Payer: Self-pay | Admitting: Physical Therapy

## 2024-03-09 ENCOUNTER — Ambulatory Visit (INDEPENDENT_AMBULATORY_CARE_PROVIDER_SITE_OTHER): Admitting: Family

## 2024-03-09 ENCOUNTER — Encounter: Payer: Self-pay | Admitting: Family

## 2024-03-09 VITALS — BP 136/74 | HR 63 | Temp 97.6°F | Resp 18 | Ht 62.0 in | Wt 184.0 lb

## 2024-03-09 DIAGNOSIS — G35 Multiple sclerosis: Secondary | ICD-10-CM | POA: Diagnosis not present

## 2024-03-09 DIAGNOSIS — L603 Nail dystrophy: Secondary | ICD-10-CM | POA: Diagnosis not present

## 2024-03-09 DIAGNOSIS — S90212D Contusion of left great toe with damage to nail, subsequent encounter: Secondary | ICD-10-CM

## 2024-03-09 DIAGNOSIS — S99922A Unspecified injury of left foot, initial encounter: Secondary | ICD-10-CM

## 2024-03-09 DIAGNOSIS — N3281 Overactive bladder: Secondary | ICD-10-CM

## 2024-03-09 NOTE — Patient Instructions (Addendum)
 1.Patient will stop at check out and schedule Annual Wellness Visit. 2.Patient will go to pharmacy to receive Tdap and Covid vaccines.

## 2024-03-16 NOTE — Progress Notes (Signed)
 Provider: Roxan Plough FNP-C  Dawn [name], Roxan BROCKS, NP  Patient Care Team: Didier Brandenburg, Roxan BROCKS, NP as PCP - General (Family Medicine) Alvan Ronal BRAVO, MD (Inactive) as PCP - Cardiology (Cardiology) Anderson Maude ORN, MD (Inactive) as Consulting Physician (Orthopedic Surgery) [name], Charlie LABOR, MD (Neurology) Carlus, [place], OD (Optometry) Stuart Norris, NP as Nurse Practitioner (Obstetrics and Gynecology) Livingston Rigg, MD as Consulting Physician (Dermatology) Margeret Kotyk, MD as Referring Physician (Physical Medicine and Rehabilitation)  Extended Emergency Contact Information Primary Emergency Contact: [facility] Phone: [phone] Mobile Phone: [phone] Relation: Friend Secondary Emergency Contact: Cranford,Jane Mobile Phone: [phone] Relation: Friend  Code Status: Full Code  Goals of care: Advanced Directive information    03/09/2024    2:43 PM  Advanced Directives  Does Patient Have a Medical Advance Directive? Yes  Type of Advance Directive Living will  Does patient want to make changes to medical advance directive? No - Patient declined     Chief Complaint  Patient presents with   Referral    Referral for dermatologist for possible nail fungus.    Discussed the use of AI scribe software for clinical note transcription with the patient, who gave verbal consent to proceed.  History of Present Illness   Dawn [name] is a 78 year old female with multiple sclerosis who presents with a black discoloration of the left big toenail.  She noticed the black discoloration of her left big toenail after removing nail varnish on Sunday night. Her last pedicure was about a month ago. No recent trauma to the toe, but she has numbness in her feet due to multiple sclerosis. No pain in the toenail area, although it might have been sore one day.  She has issues with her fingernails, specifically ridges and thickening that began about two years ago,  particularly affecting both thumbs and the index finger. She attributes some of these changes to a past multiple sclerosis episode.  She experiences significant numbness in her feet, which she attributes to her multiple sclerosis. This numbness may contribute to her being unaware of injuries to her feet, such as the current discoloration of her toenail.  She is trying to lose weight due to high cholesterol levels. However, she finds it challenging to exercise due to pain after walking for three minutes, even with a walker. She engages in chair exercises and uses resistance bands as part of her physical therapy regimen.  She uses Epsom salts and tea tree oil to soak her feet weekly to alleviate foot pain. She also uses lidocaine  patches for pain management, although she finds it difficult to apply them due to limited visibility.   Past Medical History:  Diagnosis Date   Allergy    Anxiety    CAD (coronary artery disease)    s/p DES to RCA in 06/2021   Dry eyes    Endometrial polyp    Essential hypertension 08/25/2018   GERD (gastroesophageal reflux disease)    History of hiatal hernia    History of kidney stones    Hot flashes, menopausal 10/13/2011   Estradiol  started    Hyperlipidemia    Jaundice as teenager   no problems since   Memory loss 09/21/2019     1/2 of feet numb all the time   Multiple sclerosis (HCC) dx 2001   Neuropathy    bilateral feet   Osteoarthritis    Sjogren's syndrome (HCC) dx oct 2021   sore muscvles, dry mouth and eyes   Stroke (HCC)  Vertigo    Vision abnormalities    Past Surgical History:  Procedure Laterality Date   ABDOMINAL HYSTERECTOMY  2002   partial   COLONOSCOPY  01/27/2022   2 day prep   colonscopy  2011   CORONARY STENT INTERVENTION N/A 07/18/2021   Procedure: CORONARY STENT INTERVENTION;  Surgeon: Dann Candyce RAMAN, MD;  Location: [facility] INVASIVE CV LAB;  Service: Cardiovascular;  Laterality: N/A;   DILATATION &  CURETTAGE/HYSTEROSCOPY WITH MYOSURE N/A 06/08/2020   Procedure: DILATATION & CURETTAGE/HYSTEROSCOPY/Polypectomy WITH MYOSURE;  Surgeon: Rendell Calton LABOR, DO;  Location: [facility];  Service: Gynecology;  Laterality: N/A;   LEFT HEART CATH AND CORONARY ANGIOGRAPHY N/A 07/18/2021   Procedure: LEFT HEART CATH AND CORONARY ANGIOGRAPHY;  Surgeon: Dann Candyce RAMAN, MD;  Location: [facility] INVASIVE CV LAB;  Service: Cardiovascular;  Laterality: N/A;   LUMBAR FUSION  2001   TOTAL HIP ARTHROPLASTY Right 01/17/2021   Procedure: TOTAL HIP ARTHROPLASTY ANTERIOR APPROACH;  Surgeon: Fidel Rogue, MD;  Location: [facility] ORS;  Service: Orthopedics;  Laterality: Right;   UPPER GI ENDOSCOPY  yrs ago    Allergies  Allergen Reactions   Repatha  [Evolocumab ] Itching   Crestor  [Rosuvastatin ]     MYALGIA    Pravastatin     Statins Other (See Comments)    Muscle pain; Tolerates Crestor      Outpatient Encounter Medications as of 03/09/2024  Medication Sig   acetaminophen  (TYLENOL ) 500 MG tablet Take 1,000 mg by mouth every 6 (six) hours as needed for mild pain or moderate pain.   aspirin  EC 81 MG tablet Take 81 mg by mouth daily. Swallow whole.   carvedilol  (COREG ) 6.25 MG tablet Take 1 tablet (6.25 mg total) by mouth 2 (two) times daily with a meal.   cholecalciferol (VITAMIN D ) 25 MCG (1000 UNIT) tablet Take 2,000 Units by mouth daily.   clonazePAM  (KLONOPIN ) 0.5 MG tablet Take 1 tablet (0.5 mg total) by mouth at bedtime.   estradiol  (ESTRACE ) 0.1 MG/GM vaginal cream Place 0.5-1g nightly for two weeks then twice a week after   ezetimibe  (ZETIA ) 10 MG tablet Take 1 tablet (10 mg total) by mouth daily.   fexofenadine (ALLEGRA) 180 MG tablet Take 180 mg by mouth at bedtime.    Flaxseed, Linseed, (FLAXSEED OIL PO) Take 5-10 mLs by mouth 3 (three) times a week. Power   fluticasone (FLONASE) 50 MCG/ACT nasal spray Place 1 spray into both nostrils daily as needed for allergies or rhinitis.   hydrALAZINE   (APRESOLINE ) 25 MG tablet Take 1 tablet (25 mg total) by mouth in the morning and at bedtime.   Lidocaine  4 % PTCH Place 1 patch onto the skin daily as needed (mild pain). Remove & Discard patch within 12 hours or as directed by MD   losartan  (COZAAR ) 100 MG tablet Take 1 tablet (100 mg total) by mouth daily.   melatonin 5 MG TABS Take 5 mg by mouth at bedtime as needed (Sleep).   methylPREDNISolone  (MEDROL ) 4 MG tablet Taper from 6 pills po for one day to 1 pill po the last day over 6 days (Patient taking differently: as needed. Taper from 6 pills po for one day to 1 pill po the last day over 6 days)   modafinil  (PROVIGIL ) 200 MG tablet One po qAM and one po qNoon   nitroGLYCERIN  (NITROSTAT ) 0.4 MG SL tablet Place 1 tablet (0.4 mg total) under the tongue every 5 (five) minutes as needed for chest pain.   Omega-3 Fatty Acids (OMEGA  3 500 PO) Take 500 mg by mouth daily.   pantoprazole  (PROTONIX ) 40 MG tablet TAKE 1 TABLET(40 MG) BY MOUTH TWICE DAILY   tiZANidine (ZANAFLEX) 4 MG tablet Take 2 mg by mouth 3 (three) times daily as needed.   Vibegron  (GEMTESA ) 75 MG TABS Take 1 tablet (75 mg total) by mouth daily.   albuterol  (VENTOLIN  HFA) 108 (90 Base) MCG/ACT inhaler Inhale 2 puffs into the lungs every 6 (six) hours as needed for wheezing or shortness of breath. (Patient not taking: Reported on 03/09/2024)   amoxicillin -clavulanate (AUGMENTIN ) 500-125 MG tablet Take 1 tablet by mouth 2 (two) times daily. (Patient not taking: Reported on 03/09/2024)   baclofen  (LIORESAL ) 10 MG tablet 1/2 to 1 po tid (Patient not taking: Reported on 03/09/2024)   benzonatate  (TESSALON  PERLES) 100 MG capsule Take 1 capsule (100 mg total) by mouth 3 (three) times daily as needed. (Patient not taking: Reported on 03/09/2024)   Vibegron  (GEMTESA ) 75 MG TABS Take 1 tablet (75 mg total) by mouth daily. (Patient not taking: Reported on 03/09/2024)   No facility-administered encounter medications on file as of 03/09/2024.     Review of Systems  Constitutional:  Negative for appetite change, chills, fatigue, fever and unexpected weight change.  HENT:  Negative for congestion, dental problem, ear discharge, ear pain, facial swelling, hearing loss, nosebleeds, postnasal drip, rhinorrhea, sinus pressure, sinus pain, sneezing, sore throat, tinnitus and trouble swallowing.   Eyes:  Negative for pain, discharge, redness, itching and visual disturbance.  Respiratory:  Negative for cough, chest tightness, shortness of breath and wheezing.   Cardiovascular:  Negative for chest pain, palpitations and leg swelling.  Gastrointestinal:  Negative for abdominal distention, abdominal pain, blood in stool, constipation, diarrhea, nausea and vomiting.  Genitourinary:  Negative for difficulty urinating, dysuria, flank pain, frequency and urgency.       OAB  Musculoskeletal:  Positive for arthralgias and myalgias. Negative for back pain, gait problem, joint swelling, neck pain and neck stiffness.  Skin:  Negative for color change, pallor, rash and wound.  Neurological:  Negative for dizziness, syncope, speech difficulty, weakness, light-headedness, numbness and headaches.  Hematological:  Does not bruise/bleed easily.    Immunization History  Administered Date(s) Administered   Fluad Quad(high Dose 65+) 04/01/2019, 04/19/2021   INFLUENZA, HIGH DOSE SEASONAL PF 04/30/2017, 05/15/2018, 05/18/2020   Influenza Whole 05/14/2011   Influenza-Unspecified 03/04/2022   PFIZER(Purple Top)SARS-COV-2 Vaccination 08/27/2019, 09/21/2019, 03/07/2020, 11/16/2020   Pfizer Covid-19 Vaccine Bivalent Booster 41yrs & up 03/04/2022   Pfizer(Comirnaty)Fall Seasonal Vaccine 12 years and older 04/04/2023   Pneumococcal Conjugate-13 08/30/2018   Pneumococcal Polysaccharide-23 03/21/2009, 04/20/2020   Td (Adult), 2 Lf Tetanus Toxid, Preservative Free 12/04/2009   Tdap 11/19/2012   Zoster Recombinant(Shingrix) 04/04/2023, 08/18/2023   Zoster, Live  11/29/2009   Pertinent  Health Maintenance Due  Topic Date Due   INFLUENZA VACCINE  10/18/2024 (Originally 02/19/2024)   DEXA SCAN  11/01/2024 (Originally 12/20/2012)      05/22/2023   10:04 AM 07/03/2023    1:17 PM 09/28/2023   10:25 AM 11/02/2023   10:31 AM 03/09/2024    2:43 PM  Fall Risk  Falls in the past year? 1 1 0 0 0  Was there an injury with Fall? 0 1 0 0 0  Fall Risk Category Calculator 2 2 0 0 0  Patient at Risk for Falls Due to History of fall(s) No Fall Risks No Fall Risks No Fall Risks No Fall Risks  Fall risk Follow up  Falls evaluation completed Falls evaluation completed;Education provided;Falls prevention discussed Falls evaluation completed Falls evaluation completed   Functional Status Survey:    Vitals:   03/09/24 1448  BP: 136/74  Pulse: 63  Resp: 18  Temp: 97.6 F (36.4 C)  SpO2: 97%  Weight: 184 lb (83.5 kg)  Height: 5' 2 (1.575 m)   Body mass index is 33.65 kg/m. Physical Exam  MEASUREMENTS: Weight- 184. GENERAL: Alert, cooperative, well developed, no acute distress HEENT: Normocephalic, normal oropharynx, moist mucous membranes CHEST: Clear to auscultation bilaterally, no wheezes, rhonchi, or crackles CARDIOVASCULAR: Normal heart rate and rhythm, S1 and S2 normal without murmurs ABDOMEN: Soft, non-tender, non-distended, without organomegaly, normal bowel sounds EXTREMITIES: No cyanosis or edema, subungual hematoma left great toe, right foot normal, ridges and thickening on fingernails, thumbs and index finger NEUROLOGICAL: Cranial nerves grossly intact, moves all extremities without gross motor or sensory deficit   Labs reviewed: Recent Labs    11/11/23 0953  NA 141  K 4.2  CL 105  CO2 28  GLUCOSE 91  BUN 13  CREATININE 0.65  CALCIUM  9.4   Recent Labs    04/15/23 1029 08/19/23 1002 11/11/23 0953  AST 15 19 17   ALT 14 16 14   ALKPHOS 115 129*  --   BILITOT 0.3 0.4 0.3  PROT 6.1 6.0 5.8*  ALBUMIN 4.3 3.9  --    Recent Labs     05/22/23 1059 11/11/23 0953  WBC 5.5 3.3*  NEUTROABS 3,982 2,142  HGB 13.9 13.6  HCT 42.5 41.3  MCV 93.0 89.8  PLT 248 224   Lab Results  Component Value Date   TSH 3.60 11/11/2023   Lab Results  Component Value Date   HGBA1C 5.4 06/15/2021   Lab Results  Component Value Date   CHOL 199 02/11/2024   HDL 47 02/11/2024   LDLCALC 124 (H) 02/11/2024   LDLDIRECT 130.2 11/03/2011   TRIG 159 (H) 02/11/2024   CHOLHDL 4.2 02/11/2024    Significant Diagnostic Results in last 30 days:  No results found.  Assessment/Plan  Subungual hematoma, left great toe The left great toe has a subungual hematoma, likely due to trauma, with blood under the toenail. No fungal infection is indicated as discoloration is localized. - Monitor for changes in color, redness, or drainage. - Advise use of a mirror for regular inspection. - Refer to a podiatrist if condition worsens or nail turns yellow.  Multiple sclerosis with sensory neuropathy Multiple sclerosis with sensory neuropathy causes numbness in the feet, contributing to unintentional trauma such as the subungual hematoma. - Advise caution when walking to prevent injury due to numbness.  Chronic pain with limited mobility Chronic pain limits mobility and exercise capacity. Pain occurs after walking for three minutes, requiring a walker. Pain management is challenging due to medication side effects like drowsiness. - Encourage continuation of physical therapy exercises, including chair and bed exercises. - Advise on pacing activities and resting as needed to manage pain.  Obesity BMI 33.65 Obesity is present with a current weight of 184 pounds, down from 185 pounds. Weight loss is limited by mobility issues. She engages in chair exercises and resistance band activities. - Encourage continuation of chair exercises and resistance band activities. - Advise on maintaining a healthy diet to support weight loss.  Onychorrhexis and nail  thickening, bilateral thumbs and right index finger Onychorrhexis and nail thickening are present on both thumbs and the right index finger, possibly related to age or medication use. Nails are  ridged and thick without pain or significant functional impairment.  Overactive bladder, improved with Gemtesa  Overactive bladder symptoms have improved with Gemtesa , resulting in less frequent nighttime awakenings. - Continue Gemtesa  for overactive bladder management.   Family/ staff Communication: Reviewed plan of care with patient  Labs/tests ordered: None   Next Appointment: No follow-ups on file.   Total time: minutes. Greater than 50% of total time spent doing patient education regarding subungual Hematoma,Onychorrhexis ,OAB,obesity,MS,chronic pain,health maintenance including symptom/medication management.   Roxan JAYSON Plough, NP

## 2024-03-17 ENCOUNTER — Encounter (HOSPITAL_BASED_OUTPATIENT_CLINIC_OR_DEPARTMENT_OTHER): Payer: Self-pay | Admitting: Physical Therapy

## 2024-03-17 ENCOUNTER — Ambulatory Visit (HOSPITAL_BASED_OUTPATIENT_CLINIC_OR_DEPARTMENT_OTHER): Admitting: Physical Therapy

## 2024-03-17 DIAGNOSIS — R262 Difficulty in walking, not elsewhere classified: Secondary | ICD-10-CM

## 2024-03-17 DIAGNOSIS — M5459 Other low back pain: Secondary | ICD-10-CM

## 2024-03-17 DIAGNOSIS — M6281 Muscle weakness (generalized): Secondary | ICD-10-CM | POA: Diagnosis not present

## 2024-03-17 DIAGNOSIS — R252 Cramp and spasm: Secondary | ICD-10-CM | POA: Diagnosis not present

## 2024-03-17 NOTE — Therapy (Signed)
 OUTPATIENT PHYSICAL THERAPY THORACOLUMBAR Treatment/Progress note    Patient Name: Dawn [name] MRN: [id number] DOB:March 30, 1946, 78 y.o., female Today's Date: 03/17/2024  END OF SESSION:  PT End of Session - 03/17/24 1033     Visit Number 66    Number of Visits 74    Date for PT Re-Evaluation 05/12/24    Authorization Type Progress note done at 64    PT Start Time 1015    PT Stop Time 1056    PT Time Calculation (min) 41 min    Activity Tolerance Patient tolerated treatment well;No increased pain    Behavior During Therapy [facility] for tasks assessed/performed                           Past Medical History:  Diagnosis Date   Allergy    Anxiety    CAD (coronary artery disease)    s/p DES to RCA in 06/2021   Dry eyes    Endometrial polyp    Essential hypertension 08/25/2018   GERD (gastroesophageal reflux disease)    History of hiatal hernia    History of kidney stones    Hot flashes, menopausal 10/13/2011   Estradiol  started    Hyperlipidemia    Jaundice as teenager   no problems since   Memory loss 09/21/2019     1/2 of feet numb all the time   Multiple sclerosis (HCC) dx 2001   Neuropathy    bilateral feet   Osteoarthritis    Sjogren's syndrome (HCC) dx oct 2021   sore muscvles, dry mouth and eyes   Stroke (HCC)    Vertigo    Vision abnormalities    Past Surgical History:  Procedure Laterality Date   ABDOMINAL HYSTERECTOMY  2002   partial   COLONOSCOPY  01/27/2022   2 day prep   colonscopy  2011   CORONARY STENT INTERVENTION N/A 07/18/2021   Procedure: CORONARY STENT INTERVENTION;  Surgeon: Dann Candyce RAMAN, MD;  Location: [facility];  Service: Cardiovascular;  Laterality: N/A;   DILATATION & CURETTAGE/HYSTEROSCOPY WITH MYOSURE N/A 06/08/2020   Procedure: DILATATION & CURETTAGE/HYSTEROSCOPY/Polypectomy WITH MYOSURE;  Surgeon: Rendell Calton DELENA, DO;  Location: [facility];  Service: Gynecology;  Laterality: N/A;    LEFT HEART CATH AND CORONARY ANGIOGRAPHY N/A 07/18/2021   Procedure: LEFT HEART CATH AND CORONARY ANGIOGRAPHY;  Surgeon: Dann Candyce RAMAN, MD;  Location: [facility] INVASIVE CV LAB;  Service: Cardiovascular;  Laterality: N/A;   LUMBAR FUSION  2001   TOTAL HIP ARTHROPLASTY Right 01/17/2021   Procedure: TOTAL HIP ARTHROPLASTY ANTERIOR APPROACH;  Surgeon: Fidel Rogue, MD;  Location: [facility];  Service: Orthopedics;  Laterality: Right;   UPPER GI ENDOSCOPY  yrs ago   Patient Active Problem List   Diagnosis Date Noted   History of gross hematuria 11/02/2023   Constipation 11/02/2023   Chronic diastolic congestive heart failure (HCC) 11/02/2023   UTI (urinary tract infection) 07/09/2023   OAB (overactive bladder) 07/03/2023   Statin myopathy 06/26/2023   History of CVA (cerebrovascular accident) 06/26/2023   Rheumatoid arthritis involving right hand, unspecified whether rheumatoid factor present (HCC) 05/22/2023   Atherosclerotic heart disease of native coronary artery with other forms of angina pectoris (HCC) 05/22/2023   Positive colorectal cancer screening using Cologuard test 11/14/2021   Coronary artery disease    Statin intolerance 04/19/2021   Osteoarthritis of right hip 01/17/2021   Status post THR (total hip replacement) 01/17/2021  Chronic bilateral low back pain without sciatica 10/02/2020   Chronic pain syndrome 06/20/2020   Post laminectomy syndrome 06/20/2020   Sjogren's disease (HCC) 05/23/2020   Primary osteoarthritis of both hands 05/23/2020   Primary osteoarthritis of both feet 05/23/2020   Sjogren's syndrome (HCC) 05/23/2020   Other secondary scoliosis, lumbar region 12/22/2019   Spondylosis without myelopathy or radiculopathy, lumbar region 12/22/2019   Cervical radiculopathy 10/18/2019   Numbness 09/21/2019   Bilateral carpal tunnel syndrome 09/21/2019   High risk medication use 09/21/2019   Memory loss 09/21/2019   Essential hypertension 08/25/2018   Abnormal  SPEP 02/19/2018   Hand pain 02/20/2017   Multiple joint pain 02/18/2017   Disturbed cognition 06/25/2016   Impaired cognition 06/25/2016   Sciatica, right side 10/30/2015   Chronic fatigue 08/04/2014   Gait disturbance 08/04/2014   Unspecified visual disturbance 01/31/2013   Transient vision disturbance 01/31/2013   Colon polyps 11/03/2011   POSTHERPETIC NEURALGIA 10/03/2009   DISPLCMT LUMBAR INTERVERT DISC W/O MYELOPATHY 09/05/2009   RENAL CALCULUS, RECURRENT 09/04/2009   Hyperlipidemia 08/31/2009   Multiple sclerosis (HCC) 08/31/2009   ALLERGIC RHINITIS 08/31/2009  Progress Note Reporting Period 12/18/2023 to 03/02/2024 See note below for Objective Data and Assessment of Progress/Goals.     PCP: Roxan Feast NP  REFERRING PROVIDER: Dr Darryle Running   REFERRING DIAG: Low Back Pain   Rationale for Evaluation and Treatment: Rehabilitation  THERAPY DIAG:  Difficulty in walking, not elsewhere classified  Other low back pain  Muscle weakness (generalized)  ONSET DATE: About 11 days ago her nerve abiliation wore off.   SUBJECTIVE:                                                                                                                                                                                           SUBJECTIVE STATEMENT: The patient's left hip has been going her trouble. She has had ti PERTINENT HISTORY:  Right Hip replacement 2022, Low back Pain, MS, TIA October 2023; MI, November 2022; CAD, Sjorgens syndrome  PAIN:  Are you having pain? Yes: NPRS scale: 3/10 Pain location left hip  Pain description: aching  Aggravating factors: standing and walking  Relieving factors: rest    PRECAUTIONS: None  WEIGHT BEARING RESTRICTIONS: No  FALLS:  Has patient fallen in last 6 months? 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   LUMBAR ROM:   AROM eval 5/15 10/3  Flexion Limited 50 % with pain  Mild improvement but still about 50%  Improved pain but still limited  Extension No limit     Right lateral flexion     Left lateral flexion     Right rotation Limited 50% with pain     Left rotation Limited 50% with pain      (Blank rows = not tested)  LOWER EXTREMITY ROM:     Passive  Right eval Left eval Right  Right  7/24 Right 1/2  Hip flexion 105 90 105 110 with pain at end range  100 with pain   Hip extension   10 degrees from neutral before manual today. Too neutral after stretching     Hip abduction       Hip adduction       Hip internal rotation painful Painful  Painful  Full with less pain  Painful   Hip external rotation   20 degrees with pain  45 with pain  48 with pain   Knee flexion       Knee extension       Ankle dorsiflexion       Ankle plantarflexion       Ankle inversion       Ankle eversion        (Blank rows = not tested)  LOWER EXTREMITY MMT:    MMT Right  10/2 Left 10/2 Right  3/7 Left 3/7  Right  5/30 Left  5/30 Right  8/13 Left  8/13   Hip flexion 15.3 23.4 17.5 20.4 14.8 17.4 18.8 15.5  Hip extension          Hip abduction 23.8 28.5 26.6 20.9 22.6 16.9 24.5 23.5  Hip adduction          Hip internal rotation          Hip external rotation          Knee flexion          Knee extension 23.8 23.6 28.8 20.7 23.8 17.3 28.2 30.8   Ankle dorsiflexion          Ankle plantarflexion          Ankle inversion          Ankle eversion           (Blank rows = not tested)   LEFS 19/80  FUNCTIONAL TESTS:  5 times sit to stand: 28 sec  5/15 5 times sit to stand test 20 seconds 7/25 24 sec  10/16 17 sec 1/2 17 seconds 3/14 18 sec   3-minute walk test 150 feet without requirement of seated rest break 5/15  7/25 3 min walk test 200'   10/16 3 min walk test 379'   3/14  5/30 5x sit to stand 23 sec  3 min walk test 268 with fatigue and left hip pain  8/13 5x sit to stand 15 sec  3 min walk test 415 ft       GAIT:  TODAY'S TREATMENT:  DATE:  8/28 Manual:  Prone: trigger point release to lumbar spine and hips.  LAD to left LE grade II and III   Neuro-re-ed:  Biceps curls 3 lbs 3x10  Punch 3x10 2lbs  Shoulder flexion 3x10 2lbs  Hip abduction 3x10 red  Knee extension 3x10 red        8/19 Manual:  Prone: trigger point release to lumbar spine and hips.   Neuro re-ed:  Bilateral ER 3x10 yellow  Horizontal abduction 3x10 yellow  Wand flexion with posture 3x10   Standing weight shift on air-ex 3x10  Standing march on Airex 3 x 10 each leg  8/13 Manual:  LAD left grade II and III  Roller to anterior hip and lateral gluteal   There ex: Hip abduction 3 x 10 Green Long arc quad 3 x 10 red Seated march 3 x 10  7/25  There-ex: want flexion 3x10  LTR 3x15  SAQ 3x15 each leg 2 lbs  LAQ 3x15 2lbs   Neuro--re-ed Supine ABC 3x with right  Supine march with TA breathing 3x10    7/18 Manual:  LAD left grade II and III  Roller to anterior hip and lateral gluteal   Neuro-re-ed:  Supine march with TA 3x10  Hip abduction with band green 3 x 10 with abdominal breathing Bridge 3 x 10 cueing for glutes squeeze and abdominal tightening  There-ex:  wand flexion  3x10  Nu-step 5 min 4 min        PATIENT EDUCATION:  Education details: reviewed HEP, symptom management  Person educated: Patient Education method: Explanation, Demonstration, Tactile cues, Verbal cues, and Handouts Education comprehension: verbalized understanding, returned demonstration, verbal cues required, tactile cues required, and needs further education  HOME EXERCISE PROGRAM: Has previous HEP. Will review next visit  ASSESSMENT:  CLINICAL IMPRESSION: The patient tolerated treatment well. We focused on sitting postural exercises. She had no increase in pain in her shoulders. We focused manual therapy more ont he lumbar spine. She had tightness in bilateral paraspinals and into bilateral gluteals. We kept her out of supine because it makes her syncopal.  Therapy also worked on instability exercises with the patient.  They are do exercise on Airex mat.  She tolerated well.  OBJECTIVE IMPAIRMENTS: Abnormal gait, decreased activity tolerance, decreased balance, decreased endurance, decreased knowledge of use of DME, decreased mobility, difficulty walking, decreased ROM, decreased strength, increased muscle spasms, and pain.   ACTIVITY LIMITATIONS: carrying, lifting, bending, standing, squatting, sleeping, stairs, transfers, and locomotion level  PARTICIPATION LIMITATIONS: meal prep, cleaning, laundry, driving, shopping, community activity, and yard work  PERSONAL FACTORS: Right Hip replacement 2022, Low back Pain, MS, TIA October 2023; MI, November 2022; CAD, Sjorgens syndrome are also affecting patient's functional outcome. MS  REHAB POTENTIAL: Good  CLINICAL DECISION MAKING: Evolving/moderate complexity declining mobility   EVALUATION COMPLEXITY: Moderate   GOALS: Goals reviewed with patient? No  SHORT TERM GOALS: Target date: 09/29/2022    Patient will increase gross bilateral lower extremity strength by 5 pounds Baseline: Goal status: has lost strength 2nd to  medical difficulties 5/30  2.  Patient will increase lumbar flexion by 10 degrees Baseline:  Goal status:  not tested today 2nd to pain 5/30   3.  Patient will be independent with basic after exercise program and stretching program Baseline:  Goal status: working her back towards exercises   4.  Patient will increase LEFS score by 10 points Goal status: New 8/13 5.  Patient will reduce 5 times sit  to stand test time by 5 seconds Goal status new 813   LONG TERM GOALS: Target date: 09/29/2022     Patient will stand for greater than 30 minutes without increased pain Baseline:  Goal status: limited by left hip pain today 5/30  2.  Patient will pick item up off the floor without increased low back pain Baseline:  Goal status:met back improved 4/29    3.  Patient will ambulate community distances without report of increased low back pain Baseline:  Goal status:l Community distances 8/13  4.  Patient will report improved ability to sleep through the night secondary to back pain Baseline:  Goal status: Achieved 8/13  5.  Patient will return to gym program at [facility] without increased right hip pain Goal status: New 8/13    PLAN:  PT FREQUENCY: 1x/week  PT DURATION: 10 weeks   PLANNED INTERVENTIONS: Therapeutic exercises, Therapeutic activity, Neuromuscular re-education, Balance training, Gait training, Patient/Family education, Self Care, Joint mobilization, Stair training, DME instructions, Aquatic Therapy, Dry Needling, Spinal mobilization, Cryotherapy, Moist heat, Ionotophoresis 4mg /ml Dexamethasone , and Manual therapy.  PLAN FOR NEXT SESSION: Consider manual therapy to the back and hips, consider trigger point dry needling to back and hips.  Reviewed basic HEP.  Begin active strengthening of hips and core.  Begin gait training and functional mobility training. Progress back into exercises.  Continue with balance training       Alm JINNY Don, PT 10/28/2022,  12:54 PM   I

## 2024-03-18 ENCOUNTER — Encounter (HOSPITAL_BASED_OUTPATIENT_CLINIC_OR_DEPARTMENT_OTHER): Payer: Self-pay | Admitting: Physical Therapy

## 2024-03-23 ENCOUNTER — Ambulatory Visit (HOSPITAL_BASED_OUTPATIENT_CLINIC_OR_DEPARTMENT_OTHER): Attending: Physical Medicine and Rehabilitation | Admitting: Physical Therapy

## 2024-03-23 DIAGNOSIS — R252 Cramp and spasm: Secondary | ICD-10-CM | POA: Insufficient documentation

## 2024-03-23 DIAGNOSIS — M5459 Other low back pain: Secondary | ICD-10-CM | POA: Insufficient documentation

## 2024-03-23 DIAGNOSIS — M6281 Muscle weakness (generalized): Secondary | ICD-10-CM | POA: Insufficient documentation

## 2024-03-23 DIAGNOSIS — R262 Difficulty in walking, not elsewhere classified: Secondary | ICD-10-CM | POA: Diagnosis not present

## 2024-03-23 NOTE — Therapy (Unsigned)
 OUTPATIENT PHYSICAL THERAPY THORACOLUMBAR Treatment/Progress note    Patient Name: Dawn [name] MRN: [id number] DOB:January 13, 1946, 78 y.o., female Today's Date: 03/24/2024  END OF SESSION:  PT End of Session - 03/24/24 1744     Visit Number 67    Number of Visits 74    Date for PT Re-Evaluation 05/12/24    Authorization Type Progress note done at 64    PT Start Time 1300    PT Stop Time 1343    PT Time Calculation (min) 43 min    Activity Tolerance Patient tolerated treatment well;No increased pain    Behavior During Therapy [facility] for tasks assessed/performed                            Past Medical History:  Diagnosis Date   Allergy    Anxiety    CAD (coronary artery disease)    s/p DES to RCA in 06/2021   Dry eyes    Endometrial polyp    Essential hypertension 08/25/2018   GERD (gastroesophageal reflux disease)    History of hiatal hernia    History of kidney stones    Hot flashes, menopausal 10/13/2011   Estradiol  started    Hyperlipidemia    Jaundice as teenager   no problems since   Memory loss 09/21/2019     1/2 of feet numb all the time   Multiple sclerosis (HCC) dx 2001   Neuropathy    bilateral feet   Osteoarthritis    Sjogren's syndrome (HCC) dx oct 2021   sore muscvles, dry mouth and eyes   Stroke (HCC)    Vertigo    Vision abnormalities    Past Surgical History:  Procedure Laterality Date   ABDOMINAL HYSTERECTOMY  2002   partial   COLONOSCOPY  01/27/2022   2 day prep   colonscopy  2011   CORONARY STENT INTERVENTION N/A 07/18/2021   Procedure: CORONARY STENT INTERVENTION;  Surgeon: Dann Candyce RAMAN, MD;  Location: [facility] IN[facility]ASIVE [facility];  Service: Cardiovascular;  Laterality: N/A;   DILATATION & CURETTAGE/HYSTEROSCOPY WITH MYOSURE N/A 06/08/2020   Procedure: DILATATION & CURETTAGE/HYSTEROSCOPY/Polypectomy WITH MYOSURE;  Surgeon: Rendell Calton DELENA, DO;  Location:  SURGERY CENTER;  Service: Gynecology;  Laterality: N/A;    LEFT HEART CATH AND CORONARY ANGIOGRAPHY N/A 07/18/2021   Procedure: LEFT HEART CATH AND CORONARY ANGIOGRAPHY;  Surgeon: Dann Candyce RAMAN, MD;  Location: [facility] INVASIVE CV LAB;  Service: Cardiovascular;  Laterality: N/A;   LUMBAR FUSION  2001   TOTAL HIP ARTHROPLASTY Right 01/17/2021   Procedure: TOTAL HIP ARTHROPLASTY ANTERIOR APPROACH;  Surgeon: Fidel Rogue, MD;  Location: [facility];  Service: Orthopedics;  Laterality: Right;   UPPER GI ENDOSCOPY  yrs ago   Patient Active Problem List   Diagnosis Date Noted   History of gross hematuria 11/02/2023   Constipation 11/02/2023   Chronic diastolic congestive heart failure (HCC) 11/02/2023   UTI (urinary tract infection) 07/09/2023   OAB (overactive bladder) 07/03/2023   Statin myopathy 06/26/2023   History of CVA (cerebrovascular accident) 06/26/2023   Rheumatoid arthritis involving right hand, unspecified whether rheumatoid factor present (HCC) 05/22/2023   Atherosclerotic heart disease of native coronary artery with other forms of angina pectoris (HCC) 05/22/2023   Positive colorectal cancer screening using Cologuard test 11/14/2021   Coronary artery disease    Statin intolerance 04/19/2021   Osteoarthritis of right hip 01/17/2021   Status post THR (total hip replacement) 01/17/2021  Chronic bilateral low back pain without sciatica 10/02/2020   Chronic pain syndrome 06/20/2020   Post laminectomy syndrome 06/20/2020   Sjogren's disease (HCC) 05/23/2020   Primary osteoarthritis of both hands 05/23/2020   Primary osteoarthritis of both feet 05/23/2020   Sjogren's syndrome (HCC) 05/23/2020   Other secondary scoliosis, lumbar region 12/22/2019   Spondylosis without myelopathy or radiculopathy, lumbar region 12/22/2019   Cervical radiculopathy 10/18/2019   Numbness 09/21/2019   Bilateral carpal tunnel syndrome 09/21/2019   High risk medication use 09/21/2019   Memory loss 09/21/2019   Essential hypertension 08/25/2018   Abnormal  SPEP 02/19/2018   Hand pain 02/20/2017   Multiple joint pain 02/18/2017   Disturbed cognition 06/25/2016   Impaired cognition 06/25/2016   Sciatica, right side 10/30/2015   Chronic fatigue 08/04/2014   Gait disturbance 08/04/2014   Unspecified visual disturbance 01/31/2013   Transient vision disturbance 01/31/2013   Colon polyps 11/03/2011   POSTHERPETIC NEURALGIA 10/03/2009   DISPLCMT LUMBAR INTERVERT DISC W/O MYELOPATHY 09/05/2009   RENAL CALCULUS, RECURRENT 09/04/2009   Hyperlipidemia 08/31/2009   Multiple sclerosis (HCC) 08/31/2009   ALLERGIC RHINITIS 08/31/2009  Progress Note Reporting Period 12/18/2023 to 03/02/2024 See note below for Objective Data and Assessment of Progress/Goals.     PCP: Roxan Feast NP  REFERRING PROVIDER: Dr Darryle Running   REFERRING DIAG: Low Back Pain   Rationale for Evaluation and Treatment: Rehabilitation  THERAPY DIAG:  Difficulty in walking, not elsewhere classified  Other low back pain  Muscle weakness (generalized)  ONSET DATE: About 11 days ago her nerve abiliation wore off.   SUBJECTIVE:                                                                                                                                                                                           SUBJECTIVE STATEMENT: The patient reports her back and both hips are sore. She has been working on her exercises.  \ PERTINENT HISTORY:  Right Hip replacement 2022, Low back Pain, MS, TIA October 2023; MI, November 2022; CAD, Sjorgens syndrome  PAIN:  Are you having pain? Yes: NPRS scale: 3/10 Pain location left hip  Pain description: aching  Aggravating factors: standing and walking  Relieving factors: rest    PRECAUTIONS: None  WEIGHT BEARING RESTRICTIONS: No  FALLS:  Has patient fallen in last 6 months? 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   LUMBAR ROM:   AROM eval 5/15 10/3  Flexion Limited 50 % with pain  Mild improvement but still about 50%  Improved pain but still limited  Extension No limit     Right lateral flexion     Left lateral flexion     Right rotation Limited 50% with pain     Left rotation Limited 50% with pain      (Blank rows = not tested)  LOWER EXTREMITY ROM:     Passive  Right eval Left eval Right  Right  7/24 Right 1/2  Hip flexion 105 90 105 110 with pain at end range  100 with pain   Hip extension   10 degrees from neutral before manual today. Too neutral after stretching     Hip abduction       Hip adduction       Hip internal rotation painful Painful  Painful  Full with less pain  Painful   Hip external rotation   20 degrees with pain  45 with pain  48 with pain   Knee flexion       Knee extension       Ankle dorsiflexion       Ankle plantarflexion       Ankle inversion       Ankle eversion        (Blank rows = not tested)  LOWER EXTREMITY MMT:    MMT Right  10/2 Left 10/2 Right  3/7 Left 3/7  Right  5/30 Left  5/30 Right  8/13 Left  8/13   Hip flexion 15.3 23.4 17.5 20.4 14.8 17.4 18.8 15.5  Hip extension          Hip abduction 23.8 28.5 26.6 20.9 22.6 16.9 24.5 23.5  Hip adduction          Hip internal rotation          Hip external rotation          Knee flexion          Knee  extension 23.8 23.6 28.8 20.7 23.8 17.3 28.2 30.8  Ankle dorsiflexion          Ankle plantarflexion          Ankle inversion          Ankle eversion           (Blank rows = not tested)   LEFS 19/80  FUNCTIONAL TESTS:  5 times sit to stand: 28 sec  5/15 5 times sit to stand test 20 seconds 7/25 24 sec  10/16 17 sec 1/2 17 seconds 3/14 18 sec   3-minute walk test 150 feet without requirement of seated rest break 5/15  7/25 3 min walk test 200'   10/16 3 min walk test 379'   3/14  5/30 5x sit to stand 23 sec  3 min walk test 268 with fatigue and left hip pain  8/13 5x sit to stand 15 sec  3 min walk test 415 ft  GAIT:  TODAY'S TREATMENT:                                                                                                                              DATE:  9/4  Neuro re-ed:  Supine:  March with TA 3x10  Wand flexion while keeping back flat 3x10  Hip abdcution red 3x10  Decompression: 90/90 on ball  Heel press 3x10  DKTC 3x10   There-ex:  LTR 2x10  Ball roll out fwd and lateral 5x 5 sec    8/28 Manual:  Prone: trigger point release to lumbar spine and hips.  LAD to left LE grade II and III   Neuro-re-ed:  Biceps curls 3 lbs 3x10  Punch 3x10 2lbs  Shoulder flexion 3x10 2lbs  Hip abduction 3x10 red  Knee extension 3x10 red        8/19 Manual:  Prone: trigger point release to lumbar spine and hips.   Neuro re-ed:  Bilateral ER 3x10 yellow  Horizontal abduction 3x10 yellow  Wand flexion with posture 3x10   Standing weight shift on air-ex 3x10  Standing march on Airex 3 x 10 each leg  8/13 Manual:  LAD left grade II and III  Roller to anterior hip and lateral gluteal   There ex: Hip abduction 3 x 10 Green Long arc quad 3 x 10 red Seated march 3 x 10  7/25  There-ex: want flexion 3x10  LTR 3x15  SAQ 3x15 each leg 2 lbs  LAQ 3x15 2lbs   Neuro--re-ed Supine ABC 3x with right  Supine march with TA breathing  3x10    7/18 Manual:  LAD left grade II and III  Roller to anterior hip and lateral gluteal   Neuro-re-ed:  Supine march with TA 3x10  Hip abduction with band green 3 x 10 with abdominal breathing Bridge 3 x 10 cueing for glutes squeeze and abdominal tightening  There-ex:  wand flexion 3x10  Nu-step 5 min 4 min        PATIENT EDUCATION:  Education details: reviewed HEP, symptom management  Person educated: Patient Education method: Explanation, Demonstration, Tactile cues, Verbal cues, and Handouts Education comprehension: verbalized understanding, returned demonstration, verbal cues required, tactile cues required, and needs further education  HOME EXERCISE PROGRAM: Has previous HEP. Will review next visit  ASSESSMENT:  CLINICAL IMPRESSION: Therapy focused on supine core strengthening and decompression exercises today. She continues to require skilled therapy for progression of core exercises. She reports despite multi joint pain and involvement she has been able to maintain household and community ambulation.  OBJECTIVE IMPAIRMENTS: Abnormal gait, decreased activity tolerance, decreased balance, decreased endurance, decreased knowledge of use of DME, decreased mobility, difficulty walking, decreased ROM, decreased strength, increased muscle spasms, and pain.   ACTIVITY LIMITATIONS: carrying, lifting, bending, standing, squatting, sleeping, stairs, transfers, and locomotion level  PARTICIPATION LIMITATIONS: meal prep, cleaning, laundry, driving, shopping, community activity, and yard work  PERSONAL FACTORS: Right Hip replacement  2022, Low back Pain, MS, TIA October 2023; MI, November 2022; CAD, Sjorgens syndrome are also affecting patient's functional outcome. MS  REHAB POTENTIAL: Good  CLINICAL DECISION MAKING: Evolving/moderate complexity declining mobility   EVALUATION COMPLEXITY: Moderate   GOALS: Goals reviewed with patient? No  SHORT TERM GOALS: Target  date: 09/29/2022    Patient will increase gross bilateral lower extremity strength by 5 pounds Baseline: Goal status: has lost strength 2nd to medical difficulties 5/30  2.  Patient will increase lumbar flexion by 10 degrees Baseline:  Goal status:  not tested today 2nd to pain 5/30   3.  Patient will be independent with basic after exercise program and stretching program Baseline:  Goal status: working her back towards exercises   4.  Patient will increase LEFS score by 10 points Goal status: New 8/13 5.  Patient will reduce 5 times sit to stand test time by 5 seconds Goal status new 813   LONG TERM GOALS: Target date: 09/29/2022     Patient will stand for greater than 30 minutes without increased pain Baseline:  Goal status: limited by left hip pain today 5/30  2.  Patient will pick item up off the floor without increased low back pain Baseline:  Goal status:met back improved 4/29    3.  Patient will ambulate community distances without report of increased low back pain Baseline:  Goal status:l Community distances 8/13  4.  Patient will report improved ability to sleep through the night secondary to back pain Baseline:  Goal status: Achieved 8/13  5.  Patient will return [facility] program [facility] without increased right hip pain Goal status: New 8/13    PLAN:  PT FREQUENCY: 1x/week  PT DURATION: 10 weeks   PLANNED INTERVENTIONS: Therapeutic exercises, Therapeutic activity, Neuromuscular re-education, Balance training, Gait training, Patient/Family education, Self Care, Joint mobilization, Stair training, DME instructions, Aquatic Therapy, Dry Needling, Spinal mobilization, Cryotherapy, Moist heat, Ionotophoresis 4mg /ml Dexamethasone , and Manual therapy.  PLAN FOR NEXT SESSION: Consider manual therapy to the back and hips, consider trigger point dry needling to back and hips.  Reviewed basic HEP.  Begin active strengthening of hips and core.  Begin gait  training and functional mobility training. Progress back into exercises.  Continue with balance training       Alm JINNY Don, PT 10/28/2022, 5:47 PM   I

## 2024-03-24 ENCOUNTER — Encounter (HOSPITAL_BASED_OUTPATIENT_CLINIC_OR_DEPARTMENT_OTHER): Payer: Self-pay | Admitting: Physical Therapy

## 2024-03-31 ENCOUNTER — Ambulatory Visit (HOSPITAL_BASED_OUTPATIENT_CLINIC_OR_DEPARTMENT_OTHER): Admitting: Physical Therapy

## 2024-03-31 DIAGNOSIS — M5459 Other low back pain: Secondary | ICD-10-CM

## 2024-03-31 DIAGNOSIS — M6281 Muscle weakness (generalized): Secondary | ICD-10-CM

## 2024-03-31 DIAGNOSIS — R262 Difficulty in walking, not elsewhere classified: Secondary | ICD-10-CM

## 2024-03-31 DIAGNOSIS — R252 Cramp and spasm: Secondary | ICD-10-CM

## 2024-04-01 ENCOUNTER — Encounter (HOSPITAL_BASED_OUTPATIENT_CLINIC_OR_DEPARTMENT_OTHER): Payer: Self-pay | Admitting: Physical Therapy

## 2024-04-01 ENCOUNTER — Other Ambulatory Visit (HOSPITAL_BASED_OUTPATIENT_CLINIC_OR_DEPARTMENT_OTHER): Payer: Self-pay | Admitting: Family

## 2024-04-01 DIAGNOSIS — I2510 Atherosclerotic heart disease of native coronary artery without angina pectoris: Secondary | ICD-10-CM

## 2024-04-01 DIAGNOSIS — Z8673 Personal history of transient ischemic attack (TIA), and cerebral infarction without residual deficits: Secondary | ICD-10-CM

## 2024-04-01 DIAGNOSIS — E785 Hyperlipidemia, unspecified: Secondary | ICD-10-CM

## 2024-04-01 NOTE — Therapy (Signed)
 OUTPATIENT PHYSICAL THERAPY THORACOLUMBAR Treatment/Progress note    Patient Name: TASHAWN GREFF MRN: [id number] DOB:02-09-1946, 78 y.o., female Today's Date: 04/01/2024  END OF SESSION:  PT End of Session - 04/01/24 0838     Visit Number 68    Number of Visits 74    Date for PT Re-Evaluation 05/12/24    Authorization Type Progress note done at 64    PT Start Time 1100    PT Stop Time 1144    PT Time Calculation (min) 44 min    Activity Tolerance Patient tolerated treatment well;No increased pain    Behavior During Therapy [facility] for tasks assessed/performed                            Past Medical History:  Diagnosis Date   Allergy    Anxiety    CAD (coronary artery disease)    s/p DES to RCA in 06/2021   Dry eyes    Endometrial polyp    Essential hypertension 08/25/2018   GERD (gastroesophageal reflux disease)    History of hiatal hernia    History of kidney stones    Hot flashes, menopausal 10/13/2011   Estradiol  started    Hyperlipidemia    Jaundice as teenager   no problems since   Memory loss 09/21/2019     1/2 of feet numb all the time   Multiple sclerosis (HCC) dx 2001   Neuropathy    bilateral feet   Osteoarthritis    Sjogren's syndrome (HCC) dx oct 2021   sore muscvles, dry mouth and eyes   Stroke (HCC)    Vertigo    Vision abnormalities    Past Surgical History:  Procedure Laterality Date   ABDOMINAL HYSTERECTOMY  2002   partial   COLONOSCOPY  01/27/2022   2 day prep   colonscopy  2011   CORONARY STENT INTERVENTION N/A 07/18/2021   Procedure: CORONARY STENT INTERVENTION;  Surgeon: Dann Candyce RAMAN, MD;  Location: [facility];  Service: Cardiovascular;  Laterality: N/A;   DILATATION & CURETTAGE/HYSTEROSCOPY WITH MYOSURE N/A 06/08/2020   Procedure: DILATATION & CURETTAGE/HYSTEROSCOPY/Polypectomy WITH MYOSURE;  Surgeon: Rendell Calton DELENA, DO;  Location: [facility];  Service: Gynecology;  Laterality: N/A;    LEFT HEART CATH AND CORONARY ANGIOGRAPHY N/A 07/18/2021   Procedure: LEFT HEART CATH AND CORONARY ANGIOGRAPHY;  Surgeon: Dann Candyce RAMAN, MD;  Location: [facility];  Service: Cardiovascular;  Laterality: N/A;   LUMBAR FUSION  2001   TOTAL HIP ARTHROPLASTY Right 01/17/2021   Procedure: TOTAL HIP ARTHROPLASTY ANTERIOR APPROACH;  Surgeon: Fidel Rogue, MD;  Location: [facility];  Service: Orthopedics;  Laterality: Right;   UPPER GI ENDOSCOPY  yrs ago   Patient Active Problem List   Diagnosis Date Noted   History of gross hematuria 11/02/2023   Constipation 11/02/2023   Chronic diastolic congestive heart failure (HCC) 11/02/2023   UTI (urinary tract infection) 07/09/2023   OAB (overactive bladder) 07/03/2023   Statin myopathy 06/26/2023   History of CVA (cerebrovascular accident) 06/26/2023   Rheumatoid arthritis involving right hand, unspecified whether rheumatoid factor present (HCC) 05/22/2023   Atherosclerotic heart disease of native coronary artery with other forms of angina pectoris (HCC) 05/22/2023   Positive colorectal cancer screening using Cologuard test 11/14/2021   Coronary artery disease    Statin intolerance 04/19/2021   Osteoarthritis of right hip 01/17/2021   Status post THR (total hip replacement) 01/17/2021  Chronic bilateral low back pain without sciatica 10/02/2020   Chronic pain syndrome 06/20/2020   Post laminectomy syndrome 06/20/2020   Sjogren's disease (HCC) 05/23/2020   Primary osteoarthritis of both hands 05/23/2020   Primary osteoarthritis of both feet 05/23/2020   Sjogren's syndrome (HCC) 05/23/2020   Other secondary scoliosis, lumbar region 12/22/2019   Spondylosis without myelopathy or radiculopathy, lumbar region 12/22/2019   Cervical radiculopathy 10/18/2019   Numbness 09/21/2019   Bilateral carpal tunnel syndrome 09/21/2019   High risk medication use 09/21/2019   Memory loss 09/21/2019   Essential hypertension 08/25/2018   Abnormal  SPEP 02/19/2018   Hand pain 02/20/2017   Multiple joint pain 02/18/2017   Disturbed cognition 06/25/2016   Impaired cognition 06/25/2016   Sciatica, right side 10/30/2015   Chronic fatigue 08/04/2014   Gait disturbance 08/04/2014   Unspecified visual disturbance 01/31/2013   Transient vision disturbance 01/31/2013   Colon polyps 11/03/2011   POSTHERPETIC NEURALGIA 10/03/2009   DISPLCMT LUMBAR INTERVERT DISC W/O MYELOPATHY 09/05/2009   RENAL CALCULUS, RECURRENT 09/04/2009   Hyperlipidemia 08/31/2009   Multiple sclerosis (HCC) 08/31/2009   ALLERGIC RHINITIS 08/31/2009  Progress Note Reporting Period 12/18/2023 to 03/02/2024 See note below for Objective Data and Assessment of Progress/Goals.     PCP: Roxan Feast NP  REFERRING PROVIDER: Dr Darryle Running   REFERRING DIAG: Low Back Pain   Rationale for Evaluation and Treatment: Rehabilitation  THERAPY DIAG:  Difficulty in walking, not elsewhere classified  Other low back pain  Muscle weakness (generalized)  Cramp and spasm  ONSET DATE: About 11 days ago her nerve abiliation wore off.   SUBJECTIVE:                                                                                                                                                                                           SUBJECTIVE STATEMENT: The patient reports her back and both hips are sore. She has been working on her exercises.  \ PERTINENT HISTORY:  Right Hip replacement 2022, Low back Pain, MS, TIA October 2023; MI, November 2022; CAD, Sjorgens syndrome  PAIN:  Are you having pain? Yes: NPRS scale: 3/10 Pain location left hip  Pain description: aching  Aggravating factors: standing and walking  Relieving factors: rest    PRECAUTIONS: None  WEIGHT BEARING RESTRICTIONS: No  FALLS:  Has patient fallen in last 6 months? 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   LUMBAR ROM:   AROM eval 5/15 10/3  Flexion Limited 50 % with pain  Mild improvement but still about 50%  Improved pain but still limited  Extension No limit     Right lateral flexion     Left lateral flexion     Right rotation Limited 50% with pain     Left rotation Limited 50% with pain      (Blank rows = not tested)  LOWER EXTREMITY ROM:     Passive  Right eval Left eval Right  Right  7/24 Right 1/2  Hip flexion 105 90 105 110 with pain at end range  100 with pain   Hip extension   10 degrees from neutral before manual today. Too neutral after stretching     Hip abduction       Hip adduction       Hip internal rotation painful Painful  Painful  Full with less pain  Painful   Hip external rotation   20 degrees with pain  45 with pain  48 with pain   Knee flexion       Knee extension       Ankle dorsiflexion       Ankle plantarflexion       Ankle inversion       Ankle eversion        (Blank rows = not tested)  LOWER EXTREMITY MMT:    MMT Right  10/2 Left 10/2 Right  3/7 Left 3/7  Right  5/30 Left  5/30 Right  8/13 Left  8/13   Hip flexion 15.3 23.4 17.5 20.4 14.8 17.4 18.8 15.5  Hip extension          Hip abduction 23.8 28.5 26.6 20.9 22.6 16.9 24.5 23.5  Hip adduction          Hip internal rotation          Hip external rotation          Knee flexion           Knee extension 23.8 23.6 28.8 20.7 23.8 17.3 28.2 30.8  Ankle dorsiflexion          Ankle plantarflexion          Ankle inversion          Ankle eversion           (Blank rows = not tested)   LEFS 19/80  FUNCTIONAL TESTS:  5 times sit to stand: 28 sec  5/15 5 times sit to stand test 20 seconds 7/25 24 sec  10/16 17 sec 1/2 17 seconds 3/14 18 sec   3-minute walk test 150 feet without requirement of seated rest break 5/15  7/25 3 min walk test 200'   10/16 3 min walk test 379'   3/14  5/30 5x sit to stand 23 sec  3 min walk test 268 with fatigue and left hip pain  8/13 5x sit to stand 15 sec  3 min walk test 415 ft  GAIT:  TODAY'S TREATMENT:                                                                                                                              DATE:  9/4  Neuro re-ed:  Supine:  March with TA 3x10  Wand flexion while keeping back flat 3x10  Hip abdcution red 3x10  Decompression: 90/90 on ball  Heel press 3x10  DKTC 3x10   There-ex:  LTR 2x10  Ball roll out fwd and lateral 5x 5 sec    8/28 Manual:  Prone: trigger point release to lumbar spine and hips.  LAD to left LE grade II and III   Neuro-re-ed:  Biceps curls 3 lbs 3x10  Punch 3x10 2lbs  Shoulder flexion 3x10 2lbs  Hip abduction 3x10 red  Knee extension 3x10 red        8/19 Manual:  Prone: trigger point release to lumbar spine and hips.   Neuro re-ed:  Bilateral ER 3x10 yellow  Horizontal abduction 3x10 yellow  Wand flexion with posture 3x10   Standing weight shift on air-ex 3x10  Standing march on Airex 3 x 10 each leg  8/13 Manual:  LAD left grade II and III  Roller to anterior hip and lateral gluteal   There ex: Hip abduction 3 x 10 Green Long arc quad 3 x 10 red Seated march 3 x 10  7/25  There-ex: want flexion 3x10  LTR 3x15  SAQ 3x15 each leg 2 lbs  LAQ 3x15 2lbs   Neuro--re-ed Supine ABC 3x with right  Supine march with TA  breathing 3x10    7/18 Manual:  LAD left grade II and III  Roller to anterior hip and lateral gluteal   Neuro-re-ed:  Supine march with TA 3x10  Hip abduction with band green 3 x 10 with abdominal breathing Bridge 3 x 10 cueing for glutes squeeze and abdominal tightening  There-ex:  wand flexion 3x10  Nu-step 5 min 4 min        PATIENT EDUCATION:  Education details: reviewed HEP, symptom management  Person educated: Patient Education method: Explanation, Demonstration, Tactile cues, Verbal cues, and Handouts Education comprehension: verbalized understanding, returned demonstration, verbal cues required, tactile cues required, and needs further education  HOME EXERCISE PROGRAM: Has previous HEP. Will review next visit  ASSESSMENT:  CLINICAL IMPRESSION: The patient was having pain in both hips and her lumbar spine today. We performed manual therapy to all areas. She reported improved pain following. She also performed there-ex for her hips and spine. She is maintaining her mobility despite increasing pain in her hipas and back. We will continue to work on keeping her on a regular exercise program and manual therapy to help maintain her ability to ambulate..    OBJECTIVE IMPAIRMENTS: Abnormal gait, decreased activity tolerance, decreased balance, decreased endurance, decreased knowledge of use of DME, decreased mobility, difficulty walking, decreased ROM, decreased strength, increased muscle spasms, and pain.  ACTIVITY LIMITATIONS: carrying, lifting, bending, standing, squatting, sleeping, stairs, transfers, and locomotion level  PARTICIPATION LIMITATIONS: meal prep, cleaning, laundry, driving, shopping, community activity, and yard work  PERSONAL FACTORS: Right Hip replacement 2022, Low back Pain, MS, TIA October 2023; MI, November 2022; CAD, Sjorgens syndrome are also affecting patient's functional outcome. MS  REHAB POTENTIAL: Good  CLINICAL DECISION MAKING:  Evolving/moderate complexity declining mobility   EVALUATION COMPLEXITY: Moderate   GOALS: Goals reviewed with patient? No  SHORT TERM GOALS: Target date: 09/29/2022    Patient will increase gross bilateral lower extremity strength by 5 pounds Baseline: Goal status: has lost strength 2nd to medical difficulties 5/30  2.  Patient will increase lumbar flexion by 10 degrees Baseline:  Goal status:  not tested today 2nd to pain 5/30   3.  Patient will be independent with basic after exercise program and stretching program Baseline:  Goal status: working her back towards exercises   4.  Patient will increase LEFS score by 10 points Goal status: New 8/13 5.  Patient will reduce 5 times sit to stand test time by 5 seconds Goal status new 813   LONG TERM GOALS: Target date: 09/29/2022     Patient will stand for greater than 30 minutes without increased pain Baseline:  Goal status: limited by left hip pain today 5/30  2.  Patient will pick item up off the floor without increased low back pain Baseline:  Goal status:met back improved 4/29    3.  Patient will ambulate community distances without report of increased low back pain Baseline:  Goal status:l Community distances 8/13  4.  Patient will report improved ability to sleep through the night secondary to back pain Baseline:  Goal status: Achieved 8/13  5.  Patient will return to gym program at [facility] without increased right hip pain Goal status: New 8/13    PLAN:  PT FREQUENCY: 1x/week  PT DURATION: 10 weeks   PLANNED INTERVENTIONS: Therapeutic exercises, Therapeutic activity, Neuromuscular re-education, Balance training, Gait training, Patient/Family education, Self Care, Joint mobilization, Stair training, DME instructions, Aquatic Therapy, Dry Needling, Spinal mobilization, Cryotherapy, Moist heat, Ionotophoresis 4mg /ml Dexamethasone , and Manual therapy.  PLAN FOR NEXT SESSION: Consider manual therapy  to the back and hips, consider trigger point dry needling to back and hips.  Reviewed basic HEP.  Begin active strengthening of hips and core.  Begin gait training and functional mobility training. Progress back into exercises.  Continue with balance training       Alm JINNY Don, PT 10/28/2022, 9:01 AM   I

## 2024-04-03 ENCOUNTER — Other Ambulatory Visit: Payer: Self-pay | Admitting: Cardiovascular Disease

## 2024-04-07 ENCOUNTER — Ambulatory Visit (HOSPITAL_BASED_OUTPATIENT_CLINIC_OR_DEPARTMENT_OTHER): Admitting: Physical Therapy

## 2024-04-07 DIAGNOSIS — R262 Difficulty in walking, not elsewhere classified: Secondary | ICD-10-CM

## 2024-04-07 DIAGNOSIS — M6281 Muscle weakness (generalized): Secondary | ICD-10-CM

## 2024-04-07 DIAGNOSIS — M5459 Other low back pain: Secondary | ICD-10-CM

## 2024-04-07 DIAGNOSIS — R252 Cramp and spasm: Secondary | ICD-10-CM

## 2024-04-08 ENCOUNTER — Encounter (HOSPITAL_BASED_OUTPATIENT_CLINIC_OR_DEPARTMENT_OTHER): Payer: Self-pay | Admitting: Physical Therapy

## 2024-04-08 NOTE — Therapy (Signed)
 OUTPATIENT PHYSICAL THERAPY THORACOLUMBAR Treatment/Progress note    Patient Name: Dawn [name] MRN: [id number] DOB:1945-10-04, 78 y.o., female Today's Date: 04/08/2024  END OF SESSION:                      Past Medical History:  Diagnosis Date   Allergy    Anxiety    CAD (coronary artery disease)    s/p DES to RCA in 06/2021   Dry eyes    Endometrial polyp    Essential hypertension 08/25/2018   GERD (gastroesophageal reflux disease)    History of hiatal hernia    History of kidney stones    Hot flashes, menopausal 10/13/2011   Estradiol  started    Hyperlipidemia    Jaundice as teenager   no problems since   Memory loss 09/21/2019     1/2 of feet numb all the time   Multiple sclerosis (HCC) dx 2001   Neuropathy    bilateral feet   Osteoarthritis    Sjogren's syndrome (HCC) dx oct 2021   sore muscvles, dry mouth and eyes   Stroke (HCC)    Vertigo    Vision abnormalities    Past Surgical History:  Procedure Laterality Date   ABDOMINAL HYSTERECTOMY  2002   partial   COLONOSCOPY  01/27/2022   2 day prep   colonscopy  2011   CORONARY STENT INTERVENTION N/A 07/18/2021   Procedure: CORONARY STENT INTERVENTION;  Surgeon: Dann Candyce RAMAN, MD;  Location: [facility];  Service: Cardiovascular;  Laterality: N/A;   DILATATION & CURETTAGE/HYSTEROSCOPY WITH MYOSURE N/A 06/08/2020   Procedure: DILATATION & CURETTAGE/HYSTEROSCOPY/Polypectomy WITH MYOSURE;  Surgeon: Rendell Calton DELENA, DO;  Location:  [facility];  Service: Gynecology;  Laterality: N/A;   LEFT HEART CATH AND CORONARY ANGIOGRAPHY N/A 07/18/2021   Procedure: LEFT HEART CATH AND CORONARY ANGIOGRAPHY;  Surgeon: Dann Candyce RAMAN, MD;  Location: [facility] CV [facility];  Service: Cardiovascular;  Laterality: N/A;   LUMBAR FUSION  2001   TOTAL HIP ARTHROPLASTY Right 01/17/2021   Procedure: TOTAL HIP ARTHROPLASTY ANTERIOR APPROACH;  Surgeon: Fidel Rogue, MD;  Location: [facility]  [facility];  Service: Orthopedics;  Laterality: Right;   UPPER GI ENDOSCOPY  yrs ago   Patient Active Problem List   Diagnosis Date Noted   History of gross hematuria 11/02/2023   Constipation 11/02/2023   Chronic diastolic congestive heart failure (HCC) 11/02/2023   UTI (urinary tract infection) 07/09/2023   OAB (overactive bladder) 07/03/2023   Statin myopathy 06/26/2023   History of CVA (cerebrovascular accident) 06/26/2023   Rheumatoid arthritis involving right hand, unspecified whether rheumatoid factor present (HCC) 05/22/2023   Atherosclerotic heart disease of native coronary artery with other forms of angina pectoris (HCC) 05/22/2023   Positive colorectal cancer screening using Cologuard test 11/14/2021   Coronary artery disease    Statin intolerance 04/19/2021   Osteoarthritis of right hip 01/17/2021   Status post THR (total hip replacement) 01/17/2021   Chronic bilateral low back pain without sciatica 10/02/2020   Chronic pain syndrome 06/20/2020   Post laminectomy syndrome 06/20/2020   Sjogren's disease (HCC) 05/23/2020   Primary osteoarthritis of both hands 05/23/2020   Primary osteoarthritis of both feet 05/23/2020   Sjogren's syndrome (HCC) 05/23/2020   Other secondary scoliosis, lumbar region 12/22/2019   Spondylosis without myelopathy or radiculopathy, lumbar region 12/22/2019   Cervical radiculopathy 10/18/2019   Numbness 09/21/2019   Bilateral carpal tunnel syndrome 09/21/2019   High risk medication use 09/21/2019  Memory loss 09/21/2019   Essential hypertension 08/25/2018   Abnormal SPEP 02/19/2018   Hand pain 02/20/2017   Multiple joint pain 02/18/2017   Disturbed cognition 06/25/2016   Impaired cognition 06/25/2016   Sciatica, right side 10/30/2015   Chronic fatigue 08/04/2014   Gait disturbance 08/04/2014   Unspecified visual disturbance 01/31/2013   Transient vision disturbance 01/31/2013   Colon polyps 11/03/2011   POSTHERPETIC NEURALGIA 10/03/2009    DISPLCMT LUMBAR INTERVERT DISC W/O MYELOPATHY 09/05/2009   RENAL CALCULUS, RECURRENT 09/04/2009   Hyperlipidemia 08/31/2009   Multiple sclerosis (HCC) 08/31/2009   ALLERGIC RHINITIS 08/31/2009  Progress Note Reporting Period 12/18/2023 to 03/02/2024 See note below for Objective Data and Assessment of Progress/Goals.     PCP: Roxan Feast NP  REFERRING PROVIDER: Dr Darryle Running   REFERRING DIAG: Low Back Pain   Rationale for Evaluation and Treatment: Rehabilitation  THERAPY DIAG:  No diagnosis found.  ONSET DATE: About 11 days ago her nerve abiliation wore off.   SUBJECTIVE:                                                                                                                                                                                           SUBJECTIVE STATEMENT: The patient continues to report a consistent increase in pain in bilateral hips.  She reports her lateral hips have been hurting worse lately.  She is having difficulty lying on her side at night. \ PERTINENT HISTORY:  Right Hip replacement 2022, Low back Pain, MS, TIA October 2023; MI, November 2022; CAD, Sjorgens syndrome  PAIN:  Are you having pain? Yes: NPRS scale: 3/10 Pain location left hip  Pain description: aching  Aggravating factors: standing and walking  Relieving factors: rest    PRECAUTIONS: None  WEIGHT BEARING RESTRICTIONS: No  FALLS:  Has patient fallen in last 6 months? 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   LUMBAR ROM:   AROM eval 5/15 10/3  Flexion Limited 50 % with pain  Mild improvement but still about 50%  Improved pain but still limited  Extension No limit     Right lateral flexion     Left lateral flexion     Right rotation Limited 50% with pain     Left rotation Limited 50% with pain      (Blank rows = not tested)  LOWER EXTREMITY ROM:     Passive  Right eval Left eval Right  Right  7/24 Right 1/2  Hip flexion 105 90 105 110 with pain at end range  100 with pain   Hip extension   10 degrees from neutral before manual today. Too neutral after stretching     Hip abduction       Hip adduction       Hip internal rotation painful Painful  Painful  Full with less pain  Painful   Hip external rotation   20 degrees with pain  45 with pain  48 with pain   Knee flexion       Knee extension       Ankle dorsiflexion       Ankle plantarflexion       Ankle inversion       Ankle eversion        (Blank rows = not tested)  LOWER EXTREMITY MMT:    MMT Right  10/2 Left 10/2 Right  3/7 Left 3/7  Right  5/30 Left  5/30 Right  8/13 Left  8/13   Hip flexion 15.3 23.4 17.5 20.4 14.8 17.4 18.8 15.5  Hip extension          Hip abduction 23.8 28.5 26.6 20.9 22.6 16.9 24.5 23.5  Hip adduction          Hip internal rotation          Hip external rotation          Knee flexion          Knee extension 23.8 23.6 28.8 20.7 23.8 17.3 28.2 30.8  Ankle dorsiflexion          Ankle plantarflexion          Ankle inversion          Ankle eversion           (Blank rows = not tested)   LEFS 19/80  FUNCTIONAL TESTS:  5 times sit to stand: 28 sec  5/15 5 times sit to stand test 20 seconds 7/25 24 sec  10/16 17 sec 1/2 17 seconds 3/14 18 sec   3-minute walk test 150 feet  without requirement of seated rest break 5/15  7/25 3 min walk test 200'   10/16 3 min walk test 379'   3/14  5/30 5x sit to stand 23 sec  3 min walk test 268 with fatigue and left hip pain  8/13 5x sit to stand 15 sec  3 min walk test 415 ft       GAIT:  TODAY'S TREATMENT:  DATE:  9/19 There-ex:  LTR 2x10  Ball roll out fwd and lateral 5x 5 sec    Manual:  LAD left grade II and III  Roller to anterior hip and lateral gluteal bilateral   Neuro-re-ed:  Seated biceps curl 3x10 3lbs  Seated punches 3x10 2lbs  Seated shoulder flexion 3x10 1lb  9/4  Neuro re-ed:  Supine:  March with TA 3x10  Wand flexion while keeping back flat 3x10  Hip abdcution red 3x10  Decompression: 90/90 on ball  Heel press 3x10  DKTC 3x10   There-ex:  LTR 2x10  Ball roll out fwd and lateral 5x 5 sec    8/28 Manual:  Prone: trigger point release to lumbar spine and hips.  LAD to left LE grade II and III   Neuro-re-ed:  Biceps curls 3 lbs 3x10  Punch 3x10 2lbs  Shoulder flexion 3x10 2lbs  Hip abduction 3x10 red  Knee extension 3x10 red        8/19 Manual:  Prone: trigger point release to lumbar spine and hips.   Neuro re-ed:  Bilateral ER 3x10 yellow  Horizontal abduction 3x10 yellow  Wand flexion with posture 3x10   Standing weight shift on air-ex 3x10  Standing march on Airex 3 x 10 each leg  8/13 Manual:  LAD left grade II and III  Roller to anterior hip and lateral gluteal   There ex: Hip abduction 3 x 10 Green Long arc quad 3 x 10 red Seated march 3 x 10  7/25  There-ex: want flexion 3x10  LTR 3x15  SAQ 3x15 each leg 2 lbs  LAQ 3x15 2lbs   Neuro--re-ed Supine ABC 3x with right  Supine march with TA breathing 3x10    7/18 Manual:  LAD left grade II and III  Roller to anterior hip and lateral gluteal   Neuro-re-ed:   Supine march with TA 3x10  Hip abduction with band green 3 x 10 with abdominal breathing Bridge 3 x 10 cueing for glutes squeeze and abdominal tightening  There-ex:  wand flexion 3x10  Nu-step 5 min 4 min        PATIENT EDUCATION:  Education details: reviewed HEP, symptom management  Person educated: Patient Education method: Explanation, Demonstration, Tactile cues, Verbal cues, and Handouts Education comprehension: verbalized understanding, returned demonstration, verbal cues required, tactile cues required, and needs further education  HOME EXERCISE PROGRAM: Has previous HEP. Will review next visit  ASSESSMENT:  CLINICAL IMPRESSION: The patient reported improved pain following manual therapy. We focused more on UE core stability today. She tolerated well. We will continue to progress as tolerated.    OBJECTIVE IMPAIRMENTS: Abnormal gait, decreased activity tolerance, decreased balance, decreased endurance, decreased knowledge of use of DME, decreased mobility, difficulty walking, decreased ROM, decreased strength, increased muscle spasms, and pain.   ACTIVITY LIMITATIONS: carrying, lifting, bending, standing, squatting, sleeping, stairs, transfers, and locomotion level  PARTICIPATION LIMITATIONS: meal prep, cleaning, laundry, driving, shopping, community activity, and yard work  PERSONAL FACTORS: Right Hip replacement 2022, Low back Pain, MS, TIA October 2023; MI, November 2022; CAD, Sjorgens syndrome are also affecting patient's functional outcome. MS  REHAB POTENTIAL: Good  CLINICAL DECISION MAKING: Evolving/moderate complexity declining mobility   EVALUATION COMPLEXITY: Moderate   GOALS: Goals reviewed with patient? No  SHORT TERM GOALS: Target date: 09/29/2022    Patient will increase gross bilateral lower extremity strength by 5 pounds Baseline: Goal status: has lost strength 2nd to medical difficulties  Progressing 9/19   2.  Patient will increase  lumbar flexion by 10 degrees Baseline:  Goal status:  not tested today 2nd to pain 5/30   3.  Patient will be independent with basic after exercise program and stretching program Baseline:  Goal status: working her back towards exercises   4.  Patient will increase LEFS score by 10 points Goal status: New 8/13 5.  Patient will reduce 5 times sit to stand test time by 5 seconds Goal status new 813   LONG TERM GOALS: Target date: 09/29/2022     Patient will stand for greater than 30 minutes without increased pain Baseline:  Goal status: limited by left hip pain today 5/30  2.  Patient will pick item up off the floor without increased low back pain Baseline:  Goal status:met back improved 4/29    3.  Patient will ambulate community distances without report of increased low back pain Baseline:  Goal status:l Community distances 8/13  4.  Patient will report improved ability to sleep through the night secondary to back pain Baseline:  Goal status: Achieved 8/13  5.  Patient will return [facility] gym program at [facility] without increased right hip pain Goal status: New 8/13    PLAN:  PT FREQUENCY: 1x/week  PT DURATION: 10 weeks   PLANNED INTERVENTIONS: Therapeutic exercises, Therapeutic activity, Neuromuscular re-education, Balance training, Gait training, Patient/Family education, Self Care, Joint mobilization, Stair training, DME instructions, Aquatic Therapy, Dry Needling, Spinal mobilization, Cryotherapy, Moist heat, Ionotophoresis 4mg /ml Dexamethasone , and Manual therapy.  PLAN FOR NEXT SESSION: Consider manual therapy to the back and hips, consider trigger point dry needling to back and hips.  Reviewed basic HEP.  Begin active strengthening of hips and core.  Begin gait training and functional mobility training. Progress back into exercises.  Continue with balance training       Alm JINNY Don, PT 10/28/2022, 10:26 AM   I

## 2024-04-14 ENCOUNTER — Other Ambulatory Visit: Admitting: Obstetrics

## 2024-04-14 ENCOUNTER — Encounter (HOSPITAL_BASED_OUTPATIENT_CLINIC_OR_DEPARTMENT_OTHER): Payer: Self-pay | Admitting: Physical Therapy

## 2024-04-14 ENCOUNTER — Ambulatory Visit (HOSPITAL_BASED_OUTPATIENT_CLINIC_OR_DEPARTMENT_OTHER): Admitting: Physical Therapy

## 2024-04-14 DIAGNOSIS — R262 Difficulty in walking, not elsewhere classified: Secondary | ICD-10-CM | POA: Diagnosis not present

## 2024-04-14 DIAGNOSIS — R252 Cramp and spasm: Secondary | ICD-10-CM | POA: Diagnosis not present

## 2024-04-14 DIAGNOSIS — M6281 Muscle weakness (generalized): Secondary | ICD-10-CM

## 2024-04-14 DIAGNOSIS — M5459 Other low back pain: Secondary | ICD-10-CM

## 2024-04-14 NOTE — Therapy (Signed)
 OUTPATIENT PHYSICAL THERAPY THORACOLUMBAR Treatment/Progress note    Patient Name: Dawn [name] MRN: [id number] DOB:02/11/1946, 78 y.o., female Today's Date: 04/15/2024  END OF SESSION:  PT End of Session - 04/14/24 1346     Visit Number 70    Number of Visits 74    Date for Recertification  05/12/24    Authorization Type Progress note done at 64    PT Start Time 1100    PT Stop Time 1142    PT Time Calculation (min) 42 min    Activity Tolerance Patient tolerated treatment well;No increased pain    Behavior During Therapy [facility] for tasks assessed/performed                             Past Medical History:  Diagnosis Date   Allergy    Anxiety    CAD (coronary artery disease)    s/p DES to RCA in 06/2021   Dry eyes    Endometrial polyp    Essential hypertension 08/25/2018   GERD (gastroesophageal reflux disease)    History of hiatal hernia    History of kidney stones    Hot flashes, menopausal 10/13/2011   Estradiol  started    Hyperlipidemia    Jaundice as teenager   no problems since   Memory loss 09/21/2019     1/2 of feet numb all the time   Multiple sclerosis dx 2001   Neuropathy    bilateral feet   Osteoarthritis    Sjogren's syndrome dx oct 2021   sore muscvles, dry mouth and eyes   Stroke (HCC)    Vertigo    Vision abnormalities    Past Surgical History:  Procedure Laterality Date   ABDOMINAL HYSTERECTOMY  2002   partial   COLONOSCOPY  01/27/2022   2 day prep   colonscopy  2011   CORONARY STENT INTERVENTION N/A 07/18/2021   Procedure: CORONARY STENT INTERVENTION;  Surgeon: Dann Candyce RAMAN, MD;  Location: [facility];  Service: Cardiovascular;  Laterality: N/A;   DILATATION & CURETTAGE/HYSTEROSCOPY WITH MYOSURE N/A 06/08/2020   Procedure: DILATATION & CURETTAGE/HYSTEROSCOPY/Polypectomy WITH MYOSURE;  Surgeon: Rendell Calton DELENA, DO;  Location: [facility];  Service: Gynecology;  Laterality: N/A;   LEFT  HEART CATH AND CORONARY ANGIOGRAPHY N/A 07/18/2021   Procedure: LEFT HEART CATH AND CORONARY ANGIOGRAPHY;  Surgeon: Dann Candyce RAMAN, MD;  Location: [facility];  Service: Cardiovascular;  Laterality: N/A;   LUMBAR FUSION  2001   TOTAL HIP ARTHROPLASTY Right 01/17/2021   Procedure: TOTAL HIP ARTHROPLASTY ANTERIOR APPROACH;  Surgeon: Fidel Rogue, MD;  Location: [facility];  Service: Orthopedics;  Laterality: Right;   UPPER GI ENDOSCOPY  yrs ago   Patient Active Problem List   Diagnosis Date Noted   History of gross hematuria 11/02/2023   Constipation 11/02/2023   Chronic diastolic congestive heart failure (HCC) 11/02/2023   UTI (urinary tract infection) 07/09/2023   OAB (overactive bladder) 07/03/2023   Statin myopathy 06/26/2023   History of CVA (cerebrovascular accident) 06/26/2023   Rheumatoid arthritis involving right hand, unspecified whether rheumatoid factor present (HCC) 05/22/2023   Atherosclerotic heart disease of native coronary artery with other forms of angina pectoris 05/22/2023   Positive colorectal cancer screening using Cologuard test 11/14/2021   Coronary artery disease    Statin intolerance 04/19/2021   Osteoarthritis of right hip 01/17/2021   Status post THR (total hip replacement) 01/17/2021   Chronic  bilateral low back pain without sciatica 10/02/2020   Chronic pain syndrome 06/20/2020   Post laminectomy syndrome 06/20/2020   Sjogren's disease 05/23/2020   Primary osteoarthritis of both hands 05/23/2020   Primary osteoarthritis of both feet 05/23/2020   Sjogren's syndrome 05/23/2020   Other secondary scoliosis, lumbar region 12/22/2019   Spondylosis without myelopathy or radiculopathy, lumbar region 12/22/2019   Cervical radiculopathy 10/18/2019   Numbness 09/21/2019   Bilateral carpal tunnel syndrome 09/21/2019   High risk medication use 09/21/2019   Memory loss 09/21/2019   Essential hypertension 08/25/2018   Abnormal SPEP 02/19/2018   Hand  pain 02/20/2017   Multiple joint pain 02/18/2017   Disturbed cognition 06/25/2016   Impaired cognition 06/25/2016   Sciatica, right side 10/30/2015   Chronic fatigue 08/04/2014   Gait disturbance 08/04/2014   Unspecified visual disturbance 01/31/2013   Transient vision disturbance 01/31/2013   Colon polyps 11/03/2011   POSTHERPETIC NEURALGIA 10/03/2009   DISPLCMT LUMBAR INTERVERT DISC W/O MYELOPATHY 09/05/2009   RENAL CALCULUS, RECURRENT 09/04/2009   Hyperlipidemia 08/31/2009   Multiple sclerosis 08/31/2009   ALLERGIC RHINITIS 08/31/2009  Progress Note Reporting Period 12/18/2023 to 03/02/2024 See note below for Objective Data and Assessment of Progress/Goals.     PCP: Roxan Feast NP  REFERRING PROVIDER: Dr Darryle Running   REFERRING DIAG: Low Back Pain   Rationale for Evaluation and Treatment: Rehabilitation  THERAPY DIAG:  Difficulty in walking, not elsewhere classified  Other low back pain  Muscle weakness (generalized)  Cramp and spasm  ONSET DATE: About 11 days ago her nerve abiliation wore off.   SUBJECTIVE:                                                                                                                                                                                           SUBJECTIVE STATEMENT: The patient continues to have significant pain in both hips. Her left hip is worse today. She has been moving as much as able but she as been limited.  PERTINENT HISTORY:  Right Hip replacement 2022, Low back Pain, MS, TIA October 2023; MI, November 2022; CAD, Sjorgens syndrome  PAIN:  Are you having pain? Yes: NPRS scale: 6/10 Pain location left hip  Pain description: aching  Aggravating factors: standing and walking  Relieving factors: rest    PRECAUTIONS: None  WEIGHT BEARING RESTRICTIONS: No  FALLS:  Has patient fallen in last 6 months? 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   LUMBAR ROM:   AROM eval 5/15 10/3  Flexion Limited 50 % with pain  Mild improvement but still about 50%  Improved pain but still limited  Extension No limit     Right lateral flexion     Left lateral flexion     Right rotation Limited 50% with pain     Left rotation Limited 50% with pain      (Blank rows = not tested)  LOWER EXTREMITY ROM:     Passive  Right eval Left eval Right  Right  7/24 Right 9/2  Hip flexion 105 90 105 110 with pain at end range  100 with pain   Hip extension   10 degrees from neutral before manual today. Too neutral after stretching     Hip abduction       Hip adduction       Hip internal rotation painful Painful  Painful  Full with less pain  Painful   Hip external rotation   20 degrees with pain  45 with pain  48 with pain   Knee flexion       Knee extension       Ankle dorsiflexion       Ankle plantarflexion       Ankle inversion       Ankle eversion        (Blank rows = not tested)  LOWER EXTREMITY MMT:    MMT Right  10/2 Left 10/2 Right  3/7 Left 3/7  Right  5/30 Left  5/30 Right  8/13 Left  8/13   Hip flexion 15.3 23.4 17.5 20.4 14.8 17.4 18.8 15.5  Hip extension          Hip abduction 23.8 28.5 26.6 20.9 22.6 16.9 24.5 23.5  Hip adduction          Hip internal rotation          Hip external rotation          Knee  flexion          Knee extension 23.8 23.6 28.8 20.7 23.8 17.3 28.2 30.8  Ankle dorsiflexion          Ankle plantarflexion          Ankle inversion          Ankle eversion           (Blank rows = not tested)   LEFS 19/80  FUNCTIONAL TESTS:  5 times sit to stand: 28 sec  5/15 5 times sit to stand test 20 seconds 7/25 24 sec  10/16 17 sec 1/2 17 seconds 3/14 18 sec   3-minute walk test 150 feet without requirement of seated rest break 5/15  7/25 3 min walk test 200'   10/16 3 min walk test 379'   3/14  5/30 5x sit to stand 23 sec  3 min walk test 268 with fatigue and left hip pain  8/13 5x sit to stand 15 sec  3 min walk test 415 ft       GAIT:  TODAY'S TREATMENT:                                                                                                                              DATE:  09/26 There-ex:  LTR 2x10  Quad set 3x10 each leg  SAQ 3x10 each leg  LAQ 3x10 each leg    Neuro-re-ed:  March with TA 2x10 each leg  Bilateral hip abduction red with TA   Manual:  LAD left grade II and III  Roller to anterior hip and lateral gluteal bilateral   9/19 There-ex:  LTR 2x10  Ball roll out fwd and lateral 5x 5 sec    Manual:  LAD left grade II and III  Roller to anterior hip and lateral gluteal bilateral   Neuro-re-ed:  Seated biceps curl 3x10 3lbs  Seated punches 3x10 2lbs  Seated shoulder flexion 3x10 1lb  9/4  Neuro re-ed:  Supine:  March with TA 3x10  Wand flexion while keeping back flat 3x10  Hip abdcution red 3x10  Decompression: 90/90 on ball  Heel press 3x10  DKTC 3x10   There-ex:  LTR 2x10  Ball roll out fwd and lateral 5x 5 sec    8/28 Manual:  Prone: trigger point release to lumbar spine and hips.  LAD to left LE grade II and III   Neuro-re-ed:  Biceps curls 3 lbs 3x10  Punch 3x10 2lbs  Shoulder flexion 3x10 2lbs  Hip abduction 3x10 red  Knee extension 3x10 red        8/19 Manual:  Prone: trigger  point release to lumbar spine and hips.   Neuro re-ed:  Bilateral ER 3x10 yellow  Horizontal abduction 3x10 yellow  Wand flexion with posture 3x10   Standing weight shift on air-ex 3x10  Standing march on Airex 3 x 10 each leg  8/13 Manual:  LAD left grade II and III  Roller to anterior hip and lateral gluteal   There ex: Hip abduction 3 x 10 Green Long arc quad 3 x 10 red Seated march 3 x 10  7/25  There-ex: want flexion 3x10  LTR 3x15  SAQ 3x15 each leg 2 lbs  LAQ 3x15 2lbs   Neuro--re-ed Supine ABC 3x with right  Supine march with TA breathing 3x10    7/18 Manual:  LAD left grade II and III  Roller to anterior hip and lateral gluteal   Neuro-re-ed:  Supine march with TA 3x10  Hip abduction with band green 3 x 10 with abdominal breathing Bridge 3 x 10 cueing for glutes squeeze and abdominal tightening  There-ex:  wand flexion 3x10  Nu-step 5 min 4 min        PATIENT EDUCATION:  Education details: reviewed HEP, symptom management  Person educated: Patient Education method: Explanation, Demonstration, Tactile cues, Verbal cues, and Handouts Education comprehension: verbalized understanding, returned demonstration, verbal cues required,  tactile cues required, and needs further education  HOME EXERCISE PROGRAM: Has previous HEP. Will review next visit  ASSESSMENT:  CLINICAL IMPRESSION: The patient continues to report improvement following manual therapy but the benefits only last a day or two. She continues to work on her exercises as much as she can at home. Therapy will continue to progress functional activity as much as possible.  OBJECTIVE IMPAIRMENTS: Abnormal gait, decreased activity tolerance, decreased balance, decreased endurance, decreased knowledge of use of DME, decreased mobility, difficulty walking, decreased ROM, decreased strength, increased muscle spasms, and pain.   ACTIVITY LIMITATIONS: carrying, lifting, bending, standing,  squatting, sleeping, stairs, transfers, and locomotion level  PARTICIPATION LIMITATIONS: meal prep, cleaning, laundry, driving, shopping, community activity, and yard work  PERSONAL FACTORS: Right Hip replacement 2022, Low back Pain, MS, TIA October 2023; MI, November 2022; CAD, Sjorgens syndrome are also affecting patient's functional outcome. MS  REHAB POTENTIAL: Good  CLINICAL DECISION MAKING: Evolving/moderate complexity declining mobility   EVALUATION COMPLEXITY: Moderate   GOALS: Goals reviewed with patient? No  SHORT TERM GOALS: Target date: 09/29/2022    Patient will increase gross bilateral lower extremity strength by 5 pounds Baseline: Goal status: has lost strength 2nd to medical difficulties  Progressing 9/19   2.  Patient will increase lumbar flexion by 10 degrees Baseline:  Goal status:  not tested today 2nd to pain 5/30   3.  Patient will be independent with basic after exercise program and stretching program Baseline:  Goal status: working her back towards exercises   4.  Patient will increase LEFS score by 10 points Goal status: New 8/13 5.  Patient will reduce 5 times sit to stand test time by 5 seconds Goal status new 813   LONG TERM GOALS: Target date: 09/29/2022     Patient will stand for greater than 30 minutes without increased pain Baseline:  Goal status: limited by left hip pain today 5/30  2.  Patient will pick item up off the floor without increased low back pain Baseline:  Goal status:met back improved 4/29    3.  Patient will ambulate community distances without report of increased low back pain Baseline:  Goal status:l Community distances 8/13  4.  Patient will report improved ability to sleep through the night secondary to back pain Baseline:  Goal status: Achieved 8/13  5.  Patient will [facility] program [facility] without increased right hip pain Goal status: New 8/13    PLAN:  PT FREQUENCY: 1x/week  PT  DURATION: 10 weeks   PLANNED INTERVENTIONS: Therapeutic exercises, Therapeutic activity, Neuromuscular re-education, Balance training, Gait training, Patient/Family education, Self Care, Joint mobilization, Stair training, DME instructions, Aquatic Therapy, Dry Needling, Spinal mobilization, Cryotherapy, Moist heat, Ionotophoresis 4mg /ml Dexamethasone , and Manual therapy.  PLAN FOR NEXT SESSION: Consider manual therapy to the back and hips, consider trigger point dry needling to back and hips.  Reviewed basic HEP.  Begin active strengthening of hips and core.  Begin gait training and functional mobility training. Progress back into exercises.  Continue with balance training       Alm JINNY Don, PT 10/28/2022, 8:08 AM   I

## 2024-04-15 ENCOUNTER — Encounter (HOSPITAL_BASED_OUTPATIENT_CLINIC_OR_DEPARTMENT_OTHER): Payer: Self-pay | Admitting: Physical Therapy

## 2024-04-20 ENCOUNTER — Encounter (HOSPITAL_BASED_OUTPATIENT_CLINIC_OR_DEPARTMENT_OTHER): Payer: Self-pay | Admitting: Physical Therapy

## 2024-04-20 ENCOUNTER — Ambulatory Visit (HOSPITAL_BASED_OUTPATIENT_CLINIC_OR_DEPARTMENT_OTHER): Attending: Physical Medicine and Rehabilitation | Admitting: Physical Therapy

## 2024-04-20 DIAGNOSIS — R262 Difficulty in walking, not elsewhere classified: Secondary | ICD-10-CM | POA: Insufficient documentation

## 2024-04-20 DIAGNOSIS — M5459 Other low back pain: Secondary | ICD-10-CM | POA: Insufficient documentation

## 2024-04-20 DIAGNOSIS — R252 Cramp and spasm: Secondary | ICD-10-CM | POA: Diagnosis not present

## 2024-04-20 DIAGNOSIS — M7061 Trochanteric bursitis, right hip: Secondary | ICD-10-CM | POA: Diagnosis not present

## 2024-04-20 DIAGNOSIS — M7062 Trochanteric bursitis, left hip: Secondary | ICD-10-CM | POA: Diagnosis not present

## 2024-04-20 DIAGNOSIS — M6281 Muscle weakness (generalized): Secondary | ICD-10-CM | POA: Insufficient documentation

## 2024-04-20 NOTE — Therapy (Signed)
 OUTPATIENT PHYSICAL THERAPY THORACOLUMBAR Treatment/Progress note    Patient Name: Dawn [name] MRN: [id number] DOB:12-Oct-1945, 78 y.o., female Today's Date: 04/20/2024  END OF SESSION:  PT End of Session - 04/20/24 1553     Visit Number 71    Number of Visits 74    Date for Recertification  05/12/24    Authorization Type Progress note done at 64    PT Start Time 0845    PT Stop Time 0925    PT Time Calculation (min) 40 min    Activity Tolerance Patient tolerated treatment well;No increased pain    Behavior During Therapy [facility] for tasks assessed/performed                             Past Medical History:  Diagnosis Date   Allergy    Anxiety    CAD (coronary artery disease)    s/p DES to RCA in 06/2021   Dry eyes    Endometrial polyp    Essential hypertension 08/25/2018   GERD (gastroesophageal reflux disease)    History of hiatal hernia    History of kidney stones    Hot flashes, menopausal 10/13/2011   Estradiol  started    Hyperlipidemia    Jaundice as teenager   no problems since   Memory loss 09/21/2019     1/2 of feet numb all the time   Multiple sclerosis dx 2001   Neuropathy    bilateral feet   Osteoarthritis    Sjogren's syndrome dx oct 2021   sore muscvles, dry mouth and eyes   Stroke (HCC)    Vertigo    Vision abnormalities    Past Surgical History:  Procedure Laterality Date   ABDOMINAL HYSTERECTOMY  2002   partial   COLONOSCOPY  01/27/2022   2 day prep   colonscopy  2011   CORONARY STENT INTERVENTION N/A 07/18/2021   Procedure: CORONARY STENT INTERVENTION;  Surgeon: Dann Candyce RAMAN, MD;  Location: [facility];  Service: Cardiovascular;  Laterality: N/A;   DILATATION & CURETTAGE/HYSTEROSCOPY WITH MYOSURE N/A 06/08/2020   Procedure: DILATATION & CURETTAGE/HYSTEROSCOPY/Polypectomy WITH MYOSURE;  Surgeon: Rendell Calton DELENA, DO;  Location: [facility];  Service: Gynecology;  Laterality: N/A;   LEFT  HEART CATH AND CORONARY ANGIOGRAPHY N/A 07/18/2021   Procedure: LEFT HEART CATH AND CORONARY ANGIOGRAPHY;  Surgeon: Dann Candyce RAMAN, MD;  Location: [facility];  Service: Cardiovascular;  Laterality: N/A;   LUMBAR FUSION  2001   TOTAL HIP ARTHROPLASTY Right 01/17/2021   Procedure: TOTAL HIP ARTHROPLASTY ANTERIOR APPROACH;  Surgeon: Fidel Rogue, MD;  Location: [facility];  Service: Orthopedics;  Laterality: Right;   UPPER GI ENDOSCOPY  yrs ago   Patient Active Problem List   Diagnosis Date Noted   History of gross hematuria 11/02/2023   Constipation 11/02/2023   Chronic diastolic congestive heart failure (HCC) 11/02/2023   UTI (urinary tract infection) 07/09/2023   OAB (overactive bladder) 07/03/2023   Statin myopathy 06/26/2023   History of CVA (cerebrovascular accident) 06/26/2023   Rheumatoid arthritis involving right hand, unspecified whether rheumatoid factor present (HCC) 05/22/2023   Atherosclerotic heart disease of native coronary artery with other forms of angina pectoris 05/22/2023   Positive colorectal cancer screening using Cologuard test 11/14/2021   Coronary artery disease    Statin intolerance 04/19/2021   Osteoarthritis of right hip 01/17/2021   Status post THR (total hip replacement) 01/17/2021   Chronic  bilateral low back pain without sciatica 10/02/2020   Chronic pain syndrome 06/20/2020   Post laminectomy syndrome 06/20/2020   Sjogren's disease 05/23/2020   Primary osteoarthritis of both hands 05/23/2020   Primary osteoarthritis of both feet 05/23/2020   Sjogren's syndrome 05/23/2020   Other secondary scoliosis, lumbar region 12/22/2019   Spondylosis without myelopathy or radiculopathy, lumbar region 12/22/2019   Cervical radiculopathy 10/18/2019   Numbness 09/21/2019   Bilateral carpal tunnel syndrome 09/21/2019   High risk medication use 09/21/2019   Memory loss 09/21/2019   Essential hypertension 08/25/2018   Abnormal SPEP 02/19/2018   Hand  pain 02/20/2017   Multiple joint pain 02/18/2017   Disturbed cognition 06/25/2016   Impaired cognition 06/25/2016   Sciatica, right side 10/30/2015   Chronic fatigue 08/04/2014   Gait disturbance 08/04/2014   Unspecified visual disturbance 01/31/2013   Transient vision disturbance 01/31/2013   Colon polyps 11/03/2011   POSTHERPETIC NEURALGIA 10/03/2009   DISPLCMT LUMBAR INTERVERT DISC W/O MYELOPATHY 09/05/2009   RENAL CALCULUS, RECURRENT 09/04/2009   Hyperlipidemia 08/31/2009   Multiple sclerosis 08/31/2009   ALLERGIC RHINITIS 08/31/2009  Progress Note Reporting Period 12/18/2023 to 03/02/2024 See note below for Objective Data and Assessment of Progress/Goals.     PCP: Roxan Feast NP  REFERRING PROVIDER: Dr Darryle Running   REFERRING DIAG: Low Back Pain   Rationale for Evaluation and Treatment: Rehabilitation  THERAPY DIAG:  Difficulty in walking, not elsewhere classified  Other low back pain  Muscle weakness (generalized)  Cramp and spasm  ONSET DATE: About 11 days ago her nerve abiliation wore off.   SUBJECTIVE:                                                                                                                                                                                           SUBJECTIVE STATEMENT:  The patient continues to have significant pain in both hips. It is limiting her ability to perform daily activity.   PERTINENT HISTORY:  Right Hip replacement 2022, Low back Pain, MS, TIA October 2023; MI, November 2022; CAD, Sjorgens syndrome  PAIN:  Are you having pain? Yes: NPRS scale: 6/10 Pain location left hip  Pain description: aching  Aggravating factors: standing and walking  Relieving factors: rest    PRECAUTIONS: None  WEIGHT BEARING RESTRICTIONS: No  FALLS:  Has patient fallen in last 6 months? 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   LUMBAR ROM:   AROM eval 5/15 10/3  Flexion Limited 50 % with pain  Mild improvement but still about 50%  Improved pain but still limited  Extension No limit     Right lateral flexion     Left lateral flexion     Right rotation Limited 50% with pain     Left rotation Limited 50% with pain      (Blank rows = not tested)  LOWER EXTREMITY ROM:     Passive  Right eval Left eval Right  Right  7/24 Right 9/2  Hip flexion 105 90 105 110 with pain at end range  100 with pain   Hip extension   10 degrees from neutral before manual today. Too neutral after stretching     Hip abduction       Hip adduction       Hip internal rotation painful Painful  Painful  Full with less pain  Painful   Hip external rotation   20 degrees with pain  45 with pain  48 with pain   Knee flexion       Knee extension       Ankle dorsiflexion       Ankle plantarflexion       Ankle inversion       Ankle eversion        (Blank rows = not tested)  LOWER EXTREMITY MMT:    MMT Right  10/2 Left 10/2 Right  3/7 Left 3/7  Right  5/30 Left  5/30 Right  8/13 Left  8/13   Hip flexion 15.3 23.4 17.5 20.4 14.8 17.4 18.8 15.5  Hip extension          Hip abduction 23.8 28.5 26.6 20.9 22.6 16.9 24.5 23.5  Hip adduction          Hip internal rotation          Hip external rotation          Knee flexion          Knee  extension 23.8 23.6 28.8 20.7 23.8 17.3 28.2 30.8  Ankle dorsiflexion          Ankle plantarflexion          Ankle inversion          Ankle eversion           (Blank rows = not tested)   LEFS 19/80  FUNCTIONAL TESTS:  5 times sit to stand: 28 sec  5/15 5 times sit to stand test 20 seconds 7/25 24 sec  10/16 17 sec 1/2 17 seconds 3/14 18 sec   3-minute walk test 150 feet without requirement of seated rest break 5/15  7/25 3 min walk test 200'   10/16 3 min walk test 379'   3/14  5/30 5x sit to stand 23 sec  3 min walk test 268 with fatigue and left hip pain  8/13 5x sit to stand 15 sec  3 min walk test 415 ft  GAIT:  TODAY'S TREATMENT:                                                                                                                              DATE:  10/1 There-ex:  LTR 2x10  Quad set 3x10 each leg  SAQ 3x10 each leg  LAQ 3x10 each leg red  Hip abduction red 3x10 Nu-step 5 min L5   Manual:  Manual:  LAD left grade II and III  Roller to anterior hip and lateral gluteal bilateral   09/26 There-ex:  LTR 2x10  Quad set 3x10 each leg  SAQ 3x10 each leg  LAQ 3x10 each leg    Neuro-re-ed:  March with TA 2x10 each leg  Bilateral hip abduction red with TA   Manual:  LAD left grade II and III  Roller to anterior hip and lateral gluteal bilateral   9/19 There-ex:  LTR 2x10  Ball roll out fwd and lateral 5x 5 sec    Manual:  LAD left grade II and III  Roller to anterior hip and lateral gluteal bilateral   Neuro-re-ed:  Seated biceps curl 3x10 3lbs  Seated punches 3x10 2lbs  Seated shoulder flexion 3x10 1lb  9/4  Neuro re-ed:  Supine:  March with TA 3x10  Wand flexion while keeping back flat 3x10  Hip abdcution red 3x10  Decompression: 90/90 on ball  Heel press 3x10  DKTC 3x10   There-ex:  LTR 2x10  Ball roll out fwd and lateral 5x 5 sec    8/28 Manual:  Prone: trigger point release to lumbar spine and  hips.  LAD to left LE grade II and III   Neuro-re-ed:  Biceps curls 3 lbs 3x10  Punch 3x10 2lbs  Shoulder flexion 3x10 2lbs  Hip abduction 3x10 red  Knee extension 3x10 red        8/19 Manual:  Prone: trigger point release to lumbar spine and hips.   Neuro re-ed:  Bilateral ER 3x10 yellow  Horizontal abduction 3x10 yellow  Wand flexion with posture 3x10   Standing weight shift on air-ex 3x10  Standing march on Airex 3 x 10 each leg  8/13 Manual:  LAD left grade II and III  Roller to anterior hip and lateral gluteal   There ex: Hip abduction 3 x 10 Green Long arc quad 3 x 10 red Seated march 3 x 10  7/25  There-ex: want flexion 3x10  LTR 3x15  SAQ 3x15 each leg 2 lbs  LAQ 3x15 2lbs   Neuro--re-ed Supine ABC 3x with right  Supine march with TA breathing 3x10    7/18 Manual:  LAD left grade II and III  Roller to anterior hip and lateral gluteal   Neuro-re-ed:  Supine march with TA 3x10  Hip abduction with band green 3 x 10 with abdominal breathing Bridge 3 x 10 cueing for glutes squeeze and abdominal tightening  There-ex:  wand flexion 3x10  Nu-step 5  min 4 min        PATIENT EDUCATION:  Education details: reviewed HEP, symptom management  Person educated: Patient Education method: Explanation, Demonstration, Tactile cues, Verbal cues, and Handouts Education comprehension: verbalized understanding, returned demonstration, verbal cues required, tactile cues required, and needs further education  HOME EXERCISE PROGRAM: Has previous HEP. Will review next visit  ASSESSMENT:  CLINICAL IMPRESSION: Despite therapy the patient continues to have increasing pain in her hips. We will discharge from PT at this time. She will follow up with her MD to see if there are any more pain relief options she can do. She has a full HEP. We reviewed her HEP. She feels like she can perform her HEP on her own at this time. See below for goals specific progress.    OBJECTIVE IMPAIRMENTS: Abnormal gait, decreased activity tolerance, decreased balance, decreased endurance, decreased knowledge of use of DME, decreased mobility, difficulty walking, decreased ROM, decreased strength, increased muscle spasms, and pain.   ACTIVITY LIMITATIONS: carrying, lifting, bending, standing, squatting, sleeping, stairs, transfers, and locomotion level  PARTICIPATION LIMITATIONS: meal prep, cleaning, laundry, driving, shopping, community activity, and yard work  PERSONAL FACTORS: Right Hip replacement 2022, Low back Pain, MS, TIA October 2023; MI, November 2022; CAD, Sjorgens syndrome are also affecting patient's functional outcome. MS  REHAB POTENTIAL: Good  CLINICAL DECISION MAKING: Evolving/moderate complexity declining mobility   EVALUATION COMPLEXITY: Moderate   GOALS: Goals reviewed with patient? No  SHORT TERM GOALS: Target date: 09/29/2022    Patient will increase gross bilateral lower extremity strength by 5 pounds Baseline: Goal status: has lost strength 2nd to medical difficulties  Progressing  not achieved decreasing strength since onset of pain 10/1   2.  Patient will increase lumbar flexion by 10 degrees Baseline:  Goal status:  not tested today 2nd to pain 5/30   3.  Patient will be independent with basic after exercise program and stretching program Baseline:  Goal status: working her back towards exercises   4.  Patient will increase LEFS score by 10 points Goal status: New 8/13 5.  Patient will reduce 5 times sit to stand test time by 5 seconds Goal status new 813   LONG TERM GOALS: Target date: 09/29/2022     Patient will stand for greater than 30 minutes without increased pain Baseline:  Goal status: decreasing over the past few visits 10/2   2.  Patient will pick item up off the floor without increased low back pain Baseline:  Goal status:met back improved 4/29    3.  Patient will ambulate community distances without  report of increased low back pain Baseline:  Goal status:l Limited distances at this time 10/2   4.  Patient will report improved ability to sleep through the night secondary to back pain Baseline:  Goal status: Achieved 8/13  5.  Patient will return [facility] gym program at [facility] without increased right hip pain Goal status: New 8/13    PLAN:  PT FREQUENCY: 1x/week  PT DURATION: 10 weeks   PLANNED INTERVENTIONS: Therapeutic exercises, Therapeutic activity, Neuromuscular re-education, Balance training, Gait training, Patient/Family education, Self Care, Joint mobilization, Stair training, DME instructions, Aquatic Therapy, Dry Needling, Spinal mobilization, Cryotherapy, Moist heat, Ionotophoresis 4mg /ml Dexamethasone , and Manual therapy.  PLAN FOR NEXT SESSION: Consider manual therapy to the back and hips, consider trigger point dry needling to back and hips.  Reviewed basic HEP.  Begin active strengthening of hips and core.  Begin gait training and functional mobility training.  Progress back into exercises.  Continue with balance training       Alm JINNY Don, PT 10/28/2022, 4:28 PM   I

## 2024-04-21 ENCOUNTER — Encounter (HOSPITAL_BASED_OUTPATIENT_CLINIC_OR_DEPARTMENT_OTHER): Admitting: Physical Therapy

## 2024-04-25 ENCOUNTER — Other Ambulatory Visit: Payer: Self-pay | Admitting: Family

## 2024-04-25 DIAGNOSIS — I10 Essential (primary) hypertension: Secondary | ICD-10-CM

## 2024-05-02 ENCOUNTER — Encounter (HOSPITAL_BASED_OUTPATIENT_CLINIC_OR_DEPARTMENT_OTHER): Payer: Self-pay | Admitting: Family

## 2024-05-02 ENCOUNTER — Ambulatory Visit (INDEPENDENT_AMBULATORY_CARE_PROVIDER_SITE_OTHER): Admitting: Family

## 2024-05-02 ENCOUNTER — Telehealth (HOSPITAL_BASED_OUTPATIENT_CLINIC_OR_DEPARTMENT_OTHER): Payer: Self-pay | Admitting: *Deleted

## 2024-05-02 VITALS — BP 138/68 | HR 63 | Ht 62.0 in | Wt 178.5 lb

## 2024-05-02 DIAGNOSIS — E785 Hyperlipidemia, unspecified: Secondary | ICD-10-CM | POA: Diagnosis not present

## 2024-05-02 DIAGNOSIS — I10 Essential (primary) hypertension: Secondary | ICD-10-CM

## 2024-05-02 DIAGNOSIS — I2510 Atherosclerotic heart disease of native coronary artery without angina pectoris: Secondary | ICD-10-CM | POA: Diagnosis not present

## 2024-05-02 MED ORDER — VALSARTAN 160 MG PO TABS: 160.0000 mg | ORAL_TABLET | Freq: Every day | ORAL | 2 refills | Status: DC

## 2024-05-02 NOTE — Telephone Encounter (Signed)
 Pt saw Reche Finder, NP in the clinic today and suggested to start the pt on Leqvio.  Reche Finder, NP did complete Leqvio start form and had pt consent and sign the form as well.   Completed Leqvio form was scanned to Kristin Alvstad [facility] email for further management and follow-up with the pt, once approval complete.  Will cc this message to Allean Mink [facility] to make her aware of Leqvio form sent.

## 2024-05-02 NOTE — Progress Notes (Unsigned)
 Cardiology Office Note:  .   Date:  05/04/2024  ID:  Dawn [name], DOB [birth year], MRN [id number] PCP: Dawn Roxan BROCKS, NP  [facility] Providers Cardiologist:  Dawn Ronal BRAVO, MD (Inactive)    History of Present Illness: Dawn   Dawn [name] is a 78 y.o. female MS, Sjogren's, prior CVA, HLD, hypertension, coronary artery disease  MRI 04/2022 with subacute stroke in left posterior frontal lobe. Subsequent carotid duplex, monitor unremarkable. Admitted with hypertensive urgency 06/14/2021.  Echo 05/2021 LVEF 60 to 65%, no RWMA, mild LVH, grade 1 diastolic dysfunction, mild MR, mild to moderate mitral annular calcification, mild calcification of aortic valve.  Subsequent CTA 07/09/2021 with severe stenosis with subsequent LHC 07/18/2021 with PCI-mid RAD.  Otherwise nonobstructive disease (25% LAD, 25% LCx, L-R collaterals).  Last seen 06/2024 by PharmD, incliseron and zetia  discussed and zetia  initiated.   Presents today for follow up. Continues to walk with Dawn [name] and stay as active as she can despite her MS. Reports no shortness of breath nor dyspnea on exertion. Reports no chest pain, pressure, or tightness. No edema, orthopnea, PND. Reports no palpitations.  Notes BP often higher in the morning above goal <130/80 and at goal later in the day. Discussed lipids, interested in Incliseron.   ROS: Please see the history of present illness.    All other systems reviewed and are negative.   Studies Reviewed: Dawn   EKG Interpretation Date/Time:  Monday May 02 2024 14:16:00 EDT Ventricular Rate:  63 PR Interval:  144 QRS Duration:  72 QT Interval:  396 QTC Calculation: 405 R Axis:   34  Text Interpretation: Normal sinus rhythm Normal ECG Confirmed by Dawn [name] ([id number]) on 05/02/2024 2:31:58 PM    Cardiac Studies & Procedures   ______________________________________________________________________________________________ CARDIAC CATHETERIZATION  CARDIAC  CATHETERIZATION 07/18/2021  Conclusion   Mid Cx lesion is 25% stenosed.   Prox LAD to Mid LAD lesion is 25% stenosed.   Prox RCA to Mid RCA lesion is 99% stenosed.  Left-to-right collaterals.   A drug-eluting stent was successfully placed using a STENT ONYX FRONTIER 3.0X30, postdilated to 3.5 mm.   Post intervention, there is a 0% residual stenosis.   The left ventricular systolic function is normal.   LV end diastolic pressure is normal.   The left ventricular ejection fraction is 55-65% by visual estimate.   There is no aortic valve stenosis.  Successful PCI of the mid RCA.  Brisk left to right collaterals.  It is likely that she had a ruptured plaque in November and developed collaterals since that time.  Continue aggressive secondary prevention.  Plavix  was used for antiplatelet therapy in the Cath Lab.  We will need to see with her other multiple sclerosis medicines, if Brilinta  may be a better choice due to drug interactions.  We will also have to check on which statin would not cause drug interaction.  Of note, she had significant right radial vasospasm.  This required additional verapamil  and nitroglycerin  in the radial artery.  We also used a heating pad on her right forearm.  She required 4 mg of Versed  and 100 mg of fentanyl  for sedation and this also seemed to help with the vasospasm.  Addendum: After review with the Pharm.D., Brilinta  would be a better choice than clopidogrel .  We will send her home with a supply of Brilinta  after 180 mg loading dose.  Findings Coronary Findings Diagnostic  Dominance: Right  Left Anterior Descending Prox LAD to Mid LAD  lesion is 25% stenosed.  Left Circumflex Mid Cx lesion is 25% stenosed.  Right Coronary Artery Prox RCA to Mid RCA lesion is 99% stenosed. Brisk left to right collaterals.  Third Right Posterolateral Branch Collaterals 3rd RPL filled by collaterals from Dist Cx.  Intervention  Prox RCA to Mid RCA lesion Stent CATH  LAUNCHER 6FR JR4 guide catheter was inserted. Lesion crossed with guidewire using a WIRE ASAHI PROWATER 180CM. Pre-stent angioplasty was performed using a BALLN SAPPHIRE 2.0X15. A drug-eluting stent was successfully placed using a STENT ONYX FRONTIER 3.0X30. Stent strut is well apposed. Post-stent angioplasty was performed using a BALLN SAPPHIRE [place] 3.5X15. Post-Intervention Lesion Assessment The intervention was successful. Pre-interventional TIMI flow is 1. Post-intervention TIMI flow is 3. No complications occurred at this lesion. There is a 0% residual stenosis post intervention.     ECHOCARDIOGRAM  ECHOCARDIOGRAM COMPLETE 06/15/2021  Narrative ECHOCARDIOGRAM REPORT    Patient Name:   Dawn [name] Date of Exam: 06/15/2021 Medical Rec #:  [id number]         Height:       62.0 in Accession #:    [id number]        Weight:       182.0 lb Date of Birth:  Nov 22, 1945          BSA:          1.837 m Patient Age:    75 years          BP:           136/68 mmHg Patient Gender: F                 HR:           70 bpm. Exam Location:  Inpatient  Procedure: 2D Echo, Cardiac Doppler and Color Doppler  Indications:    Chest pain  History:        Patient has no prior history of Echocardiogram examinations. Signs/Symptoms:Chest Pain and Dizziness/Lightheadedness; Risk Factors:Hypertension. Vertigo. Arm numbness.  Sonographer:    Dawn [name] RDCS Referring Phys: [id number] Dawn [name]  IMPRESSIONS   1. Left ventricular ejection fraction, by estimation, is 60 to 65%. The left ventricle has normal function. The left ventricle has no regional wall motion abnormalities. There is mild left ventricular hypertrophy. Left ventricular diastolic parameters are consistent with Grade I diastolic dysfunction (impaired relaxation). 2. Right ventricular systolic function is normal. The right ventricular size is normal. Tricuspid regurgitation signal is inadequate for assessing PA pressure. 3. The mitral  valve is grossly normal. Mild mitral valve regurgitation. No evidence of mitral stenosis. 4. The aortic valve is grossly normal. There is mild calcification of the aortic valve. Aortic valve regurgitation is not visualized. No aortic stenosis is present. 5. The inferior vena cava is normal in size with greater than 50% respiratory variability, suggesting right atrial pressure of 3 mmHg.  FINDINGS Left Ventricle: Left ventricular ejection fraction, by estimation, is 60 to 65%. The left ventricle has normal function. The left ventricle has no regional wall motion abnormalities. The left ventricular internal cavity size was normal in size. There is mild left ventricular hypertrophy. Left ventricular diastolic parameters are consistent with Grade I diastolic dysfunction (impaired relaxation).  Right Ventricle: The right ventricular size is normal. No increase in right ventricular wall thickness. Right ventricular systolic function is normal. Tricuspid regurgitation signal is inadequate for assessing PA pressure.  Left Atrium: Left atrial size was normal in size.  Right Atrium: Right atrial size  was normal in size.  Pericardium: There is no evidence of pericardial effusion. Presence of epicardial fat layer.  Mitral Valve: The mitral valve is grossly normal. Mild to moderate mitral annular calcification. Mild mitral valve regurgitation. No evidence of mitral valve stenosis.  Tricuspid Valve: The tricuspid valve is normal in structure. Tricuspid valve regurgitation is trivial. No evidence of tricuspid stenosis.  Aortic Valve: The aortic valve is grossly normal. There is mild calcification of the aortic valve. Aortic valve regurgitation is not visualized. No aortic stenosis is present. Aortic valve mean gradient measures 4.0 mmHg. Aortic valve peak gradient measures 6.9 mmHg. Aortic valve area, by VTI measures 2.11 cm.  Pulmonic Valve: The pulmonic valve was normal in structure. Pulmonic valve  regurgitation is not visualized. No evidence of pulmonic stenosis.  Aorta: The aortic root is normal in size and structure. There is atheroma plaque involving the aortic arch and descending aorta.  Venous: The inferior vena cava is normal in size with greater than 50% respiratory variability, suggesting right atrial pressure of 3 mmHg.  IAS/Shunts: No atrial level shunt detected by color flow Doppler.   LEFT VENTRICLE PLAX 2D LVIDd:         4.60 cm     Diastology LVIDs:         3.00 cm     LV e' medial:    7.51 cm/s LV PW:         1.10 cm     LV E/e' medial:  10.5 LV IVS:        1.10 cm     LV e' lateral:   9.46 cm/s LVOT diam:     1.80 cm     LV E/e' lateral: 8.3 LV SV:         64 LV SV Index:   35 LVOT Area:     2.54 cm  LV Volumes (MOD) LV vol d, MOD A4C: 84.7 ml LV vol s, MOD A4C: 25.2 ml LV SV MOD A4C:     84.7 ml  RIGHT VENTRICLE          IVC RV Basal diam:  4.00 cm  IVC diam: 1.90 cm  LEFT ATRIUM             Index        RIGHT ATRIUM           Index LA diam:        3.70 cm 2.01 cm/m   RA Area:     11.70 cm LA Vol (A2C):   51.3 ml 27.93 ml/m  RA Volume:   22.40 ml  12.20 ml/m LA Vol (A4C):   39.5 ml 21.51 ml/m LA Biplane Vol: 48.7 ml 26.52 ml/m AORTIC VALVE AV Area (Vmax):    2.08 cm AV Area (Vmean):   2.11 cm AV Area (VTI):     2.11 cm AV Vmax:           131.00 cm/s AV Vmean:          89.800 cm/s AV VTI:            0.303 m AV Peak Grad:      6.9 mmHg AV Mean Grad:      4.0 mmHg LVOT Vmax:         107.00 cm/s LVOT Vmean:        74.400 cm/s LVOT VTI:          0.251 m LVOT/AV VTI ratio: 0.83  AORTA Ao Root diam: 3.10  cm  MITRAL VALVE MV Area (PHT): 2.76 cm     SHUNTS MV Decel Time: 275 msec     Systemic VTI:  0.25 m MV E velocity: 78.60 cm/s   Systemic Diam: 1.80 cm MV A velocity: 124.00 cm/s MV E/A ratio:  0.63  Soyla Merck MD Electronically signed by Soyla Merck MD Signature Date/Time: 06/15/2021/1:25:52 PM    Final     MONITORS  CARDIAC EVENT MONITOR 07/16/2022  Narrative No significant tachyarrhythmia or bradyarrhythmia. No atrial fibrillation or flutter.   CT SCANS  CT CORONARY FRACTIONAL FLOW RESERVE DATA PREP 07/09/2021  Narrative EXAM: CT-FFR ANALYSIS  CLINICAL DATA:  Possibly obstructive coronary lesion (LAD, RCA)  FINDINGS: CT-FFR analysis was performed on the original cardiac CT angiogram dataset. Diagrammatic representation of the CT-FFR analysis is provided in a separate PDF document in PACS. This dictation was created using the PDF document and an interactive 3D model of the results. 3D model is not available in the EMR/PACS. Normal FFR range is >0.80.  1. Left Main: No significant functional stenosis, CT-FFR 0.99.  2. LAD: No significant functional stenosis, CT-FFR 0.96 to 0.91 at LAD lesion. 3. LCX: No significant functional stenosis, CT-FFR 0.89 distal vessel. 4. RCA: significant functional stenosis, CT-FFR 0.54.  IMPRESSION: 1. CT FFR analysis shows evidence of significant functional stenosis- RCA.  Stanly Leavens MD   Electronically Signed By: Stanly Leavens M.D. On: 07/09/2021 15:02   CT CORONARY MORPH W/CTA COR W/SCORE 07/09/2021  Addendum 07/19/2021  9:30 AM ADDENDUM REPORT: 07/19/2021 09:28  EXAM: OVER-READ INTERPRETATION  CT CHEST  The following report is an over-read performed by radiologist Dr. Marcey Diones [facility] on 07/19/2021. This over-read does not include interpretation of cardiac or coronary anatomy or pathology. The coronary CTA interpretation by the cardiologist is attached.  COMPARISON:  None.  FINDINGS: Atherosclerosis of the thoracic aorta. Visualized mediastinum and hilar regions demonstrate no lymphadenopathy or masses. There is a small hiatal hernia. Visualized lungs show no evidence of pulmonary edema, consolidation, pneumothorax, nodule or pleural fluid. Visualized upper abdomen and bony  structures are unremarkable.  IMPRESSION: 1. Atherosclerosis of the thoracic aorta. 2. Small hiatal hernia.   Electronically Signed By: Marcey Moan M.D. On: 07/19/2021 09:28  Narrative CLINICAL DATA:  78 Year old White Female  EXAM: Cardiac/Coronary  CTA  TECHNIQUE: The patient was scanned on a [employer].  FINDINGS: Scan was triggered in the descending thoracic aorta. Axial non-contrast 3 mm slices were carried out through the heart. The data set was analyzed on a dedicated work station and scored using the Agatson method. Gantry rotation speed was 250 msecs and collimation was .6 mm. 0.8 mg of sl NTG was given. The 3D data set was reconstructed in 5% intervals of the 67-82 % of the R-R cycle. Diastolic phases were analyzed on a dedicated work station using MPR, MIP and VRT modes. The patient received 95 cc of contrast.  Aorta: Normal size. Scattered aortic calcifications. No dissection.  Main Pulmonary Artery: Normal size of the pulmonary artery.  Aortic Valve:  Tri-leaflet.  Aortic Valve calcium  score 664.  Coronary Arteries:  Normal coronary origin.  Right dominance.  Coronary Calcium  Score:  Left main: 12  Left anterior descending artery: 98  Left circumflex artery: 24  Right coronary artery: 7  Total: 141  Percentile: 71st for age, sex, and race matched control.  RCA is a large dominant artery that gives rise to PDA and PLA. There is a severe  soft plaque stenosis on the proximal vessel.  Left main is a large artery that gives rise to LAD and LCX arteries. Minimal non obstructive distal calcified plaque  LAD is a large vessel that gives rise to one large D1 Branch. There is a moderate stenosis mixed plaque in the proximal vessel.  LCX is a non-dominant artery. Mild non obstructive plaque in the proximal vessel.  Other findings:  Normal variant pulmonary vein drainage into the left atrium- presence of left middle pulmonary  vein.  Normal left atrial appendage without a thrombus.  Extra-cardiac findings: See attached radiology report for non-cardiac structures.  IMPRESSION: 1. Coronary calcium  score of 141. This was 71st percentile for age, sex, and race matched control.  2. Normal coronary origin with right dominance.  3. CAD-RADS 4 Severe stenosis. (70-99% or > 50% left main). Cardiac catheterization or CT FFR is recommended. Consider symptom-guided anti-ischemic pharmacotherapy as well as risk factor modification per guideline directed care.  RECOMMENDATIONS:  Coronary artery calcium  (CAC) score is a strong predictor of incident coronary heart disease (CHD) and provides predictive information beyond traditional risk factors. CAC scoring is reasonable to use in the decision to withhold, postpone, or initiate statin therapy in intermediate-risk or selected borderline-risk asymptomatic adults (age 67-75 years and LDL-C >=70 to <190 mg/dL) who do not have diabetes or established atherosclerotic cardiovascular disease (ASCVD).* In intermediate-risk (10-year ASCVD risk >=7.5% to <20%) adults or selected borderline-risk (10-year ASCVD risk >=5% to <7.5%) adults in whom a CAC score is measured for the purpose of making a treatment decision the following recommendations have been made:  If CAC = 0, it is reasonable to withhold statin therapy and reassess in 5 to 10 years, as long as higher risk conditions are absent (diabetes mellitus, family history of premature CHD in first degree relatives (males <55 years; females <65 years), cigarette smoking, LDL >=190 mg/dL or other independent risk factors).  If CAC is 1 to 99, it is reasonable to initiate statin therapy for patients >=6 years of age.  If CAC is >=100 or >=75th percentile, it is reasonable to initiate statin therapy at any age.  Cardiology referral should be considered for patients with CAC scores =400 or >=75th percentile.  *2018  AHA/ACC/AACVPR/AAPA/ABC/ACPM/ADA/AGS/APhA/ASPC/NLA/PCNA Guideline on the Management of Blood Cholesterol: A Report of the American College of Cardiology/American Heart Association Task Force on Clinical Practice Guidelines. J Am Coll Cardiol. 2019;73(24):3168-3209.  Stanly Leavens, MD  Electronically Signed: By: Stanly Leavens M.D. On: 07/09/2021 14:57     ______________________________________________________________________________________________      Risk Assessment/Calculations:             Physical Exam:   VS:  BP 138/68 (BP Location: Right Arm, Patient Position: Sitting, Cuff Size: Normal)   Pulse 63   Ht 5' 2 (1.575 m)   Wt 178 lb 8 oz (81 kg)   SpO2 96%   BMI 32.65 kg/m    Wt Readings from Last 3 Encounters:  05/03/24 182 lb 6.4 oz (82.7 kg)  05/02/24 178 lb 8 oz (81 kg)  03/09/24 184 lb (83.5 kg)    GEN: Well nourished, well developed in no acute distress NECK: No JVD; No carotid bruits CARDIAC: RRR, no murmurs, rubs, gallops RESPIRATORY:  Clear to auscultation without rales, wheezing or rhonchi  ABDOMEN: Soft, non-tender, non-distended EXTREMITIES:  No edema; No deformity   ASSESSMENT AND PLAN: .     CAD s/p PCI RCA / HLD, LDL goal <70- Stable with no anginal symptoms.  No indication for ischemic evaluation. Intolerant of statin with myalgias. 01/2024 LDL 124. Tolerating Zetia  10mg  daily. Will initiate prior authorization process for Incliseron.   History of CVA- Continue aspirin . Statin holiday, as above.   HTN- BP not consistently at goal <130/80. Suspect Losartan  not maintaining adequate control over 24h period. Stop Losartan . Start Valsartan 160mg  daily. Discussed to monitor BP at home at least 2 hours after medications and sitting for 5-10 minutes. MyChart message in 2 weeks to check in on BP.        Dispo: As scheduled with Dr. Raford  Signed, Reche GORMAN Finder, NP

## 2024-05-02 NOTE — Patient Instructions (Addendum)
 Medication Instructions:  STOP Losartan   START Valsartan 160mg  daily  We are submitting approval for Incliseron (Leqvio) for cholesterol - we will update you when approved  *If you need a refill on your cardiac medications before your next appointment, please call your pharmacy*  Testing/Procedures: Your EKG today looked normal!  Follow-Up: At [facility], you and your health needs are our priority.  As part of our continuing mission to provide you with exceptional heart care, our providers are all part of one team.  This team includes your primary Cardiologist (physician) and Advanced Practice Providers or APPs (Physician Assistants and Nurse Practitioners) who all work together to provide you with the care you need, when you need it.  Your next appointment:   1 year(s)  Provider:   Annabella Scarce, MD or Reche Finder, NP    We recommend signing up for the patient portal called MyChart.  Sign up information is provided on this After Visit Summary.  MyChart is used to connect with patients for Virtual Visits (Telemedicine).  Patients are able to view lab/test results, encounter notes, upcoming appointments, etc.  Non-urgent messages can be sent to your provider as well.   To learn more about what you can do with MyChart, go to [email or web address].   Other Instructions  Once you have your first two injections of Incliseron (Leqvio) we will contact you about scheduling follow up labs to reassess cholesterol levels after starting. We will continue Ezetimibe  (Zetia ) until those follow up labs.

## 2024-05-03 ENCOUNTER — Encounter: Payer: Self-pay | Admitting: Family

## 2024-05-03 ENCOUNTER — Ambulatory Visit: Admitting: Family

## 2024-05-03 VITALS — BP 136/80 | HR 63 | Temp 97.6°F | Resp 20 | Ht 62.0 in | Wt 182.4 lb

## 2024-05-03 DIAGNOSIS — N3281 Overactive bladder: Secondary | ICD-10-CM | POA: Diagnosis not present

## 2024-05-03 DIAGNOSIS — G35D Multiple sclerosis, unspecified: Secondary | ICD-10-CM

## 2024-05-03 DIAGNOSIS — I5032 Chronic diastolic (congestive) heart failure: Secondary | ICD-10-CM | POA: Diagnosis not present

## 2024-05-03 DIAGNOSIS — E782 Mixed hyperlipidemia: Secondary | ICD-10-CM

## 2024-05-03 DIAGNOSIS — I10 Essential (primary) hypertension: Secondary | ICD-10-CM | POA: Diagnosis not present

## 2024-05-03 DIAGNOSIS — Z23 Encounter for immunization: Secondary | ICD-10-CM

## 2024-05-03 DIAGNOSIS — M069 Rheumatoid arthritis, unspecified: Secondary | ICD-10-CM

## 2024-05-03 NOTE — Patient Instructions (Signed)
 1.Go to local pharmacy and receive Covid vaccine. 2.Stop at check out and schedule Annual Wellness Visit.

## 2024-05-03 NOTE — Progress Notes (Signed)
 Provider: Roxan Plough FNP-C   Lacreshia Bondarenko, Roxan BROCKS, NP  Patient Care Team: Rolanda Campa, Roxan BROCKS, NP as PCP - General (Family Medicine) Alvan Ronal BRAVO, MD (Inactive) as PCP - Cardiology (Cardiology) Anderson Maude ORN, MD (Inactive) as Consulting Physician (Orthopedic Surgery) [name], Charlie LABOR, MD (Neurology) Carlus, [place], OD (Optometry) Stuart Norris, NP as Nurse Practitioner (Obstetrics and Gynecology) Livingston Rigg, MD as Consulting Physician (Dermatology) Margeret Kotyk, MD as Referring Physician (Physical Medicine and Rehabilitation)  Extended Emergency Contact Information Primary Emergency Contact: Smitty Simas  [place] Mobile Phone: [phone] Relation: Friend Secondary Emergency Contact: Cranford,Jane Mobile Phone: [phone] Relation: Friend  Code Status:  Full Code  Goals of care: Advanced Directive information    03/09/2024    2:43 PM  Advanced Directives  Does Patient Have a Medical Advance Directive? Yes  Type of Advance Directive Living will  Does patient want to make changes to medical advance directive? No - Patient declined     Chief Complaint  Patient presents with   Medical Management of Chronic Issues    6 Month follow up.    Discussed the use of AI scribe software for clinical note transcription with the patient, who gave verbal consent to proceed.  History of Present Illness   Dawn [name] is a 78 year old female with multiple sclerosis, Chronic congestive heart failure, Rheumatoid Arthritis, and hyperlipidemia who presents for a six-month follow-up.  She is concerned about her cholesterol levels as her LDL has been high. Her last lipid panel in July showed a total cholesterol of 199 mg/dL, up from 818 mg/dL in January. Her triglycerides were 159 mg/dL, up from 876 mg/dL, and her HDL was 47 mg/dL, slightly down from 49 mg/dL. The LDL was 124 mg/dL, up from 889 mg/dL. She has not changed her diet and has been 'extra  careful' since the last test. she reports no leg edema, cough, wheezing or shortness of breath. She has multiple sclerosis, which is described as progressive. She experiences muscle pain and has received steroid injections in her hips and back, which provide temporary relief. She also undergoes physical therapy and has previously participated in water  exercises but has not been able to recently due to a toe injury. She misses the water  exercises and hopes to return soon.  She reports improved urinary symptoms with the use of Gemtesa , allowing her to sleep better and feel like she is emptying her bladder completely. She checks her bladder emptying as advised by her urologist.  She experiences sneezing and coughing sometimes when eating or sitting back, which she attributes to drainage.    Past Medical History:  Diagnosis Date   Allergy    Anxiety    CAD (coronary artery disease)    s/p DES to RCA in 06/2021   Dry eyes    Endometrial polyp    Essential hypertension 08/25/2018   GERD (gastroesophageal reflux disease)    History of hiatal hernia    History of kidney stones    Hot flashes, menopausal 10/13/2011   Estradiol  started    Hyperlipidemia    Jaundice as teenager   no problems since   Memory loss 09/21/2019     1/2 of feet numb all the time   Multiple sclerosis dx 2001   Neuropathy    bilateral feet   Osteoarthritis    Sjogren's syndrome dx oct 2021   sore muscvles, dry mouth and eyes   Stroke [facility])    Vertigo    Vision  abnormalities    Past Surgical History:  Procedure Laterality Date   ABDOMINAL HYSTERECTOMY  2002   partial   COLONOSCOPY  01/27/2022   2 day prep   colonscopy  2011   CORONARY STENT INTERVENTION N/A 07/18/2021   Procedure: CORONARY STENT INTERVENTION;  Surgeon: Dann Candyce RAMAN, MD;  Location: [facility] INVASIVE CV LAB;  Service: Cardiovascular;  Laterality: N/A;   DILATATION & CURETTAGE/HYSTEROSCOPY WITH MYOSURE N/A 06/08/2020   Procedure: DILATATION  & CURETTAGE/HYSTEROSCOPY/Polypectomy WITH MYOSURE;  Surgeon: Rendell Calton LABOR, DO;  Location: [facility];  Service: Gynecology;  Laterality: N/A;   LEFT HEART CATH AND CORONARY ANGIOGRAPHY N/A 07/18/2021   Procedure: LEFT HEART CATH AND CORONARY ANGIOGRAPHY;  Surgeon: Dann Candyce RAMAN, MD;  Location: [facility] INVASIVE CV LAB;  Service: Cardiovascular;  Laterality: N/A;   LUMBAR FUSION  2001   TOTAL HIP ARTHROPLASTY Right 01/17/2021   Procedure: TOTAL HIP ARTHROPLASTY ANTERIOR APPROACH;  Surgeon: Fidel Rogue, MD;  Location: [facility] ORS;  Service: Orthopedics;  Laterality: Right;   UPPER GI ENDOSCOPY  yrs ago    Allergies  Allergen Reactions   Repatha  [Evolocumab ] Itching   Crestor  [Rosuvastatin ]     MYALGIA    Pravastatin     Statins Other (See Comments)    Muscle pain; Tolerates Crestor      Allergies as of 05/03/2024       Reactions   Repatha  [evolocumab ] Itching   Crestor  [rosuvastatin ]    MYALGIA    Pravastatin     Statins Other (See Comments)   Muscle pain; Tolerates Crestor          Medication List        Accurate as of May 03, 2024 11:09 AM. If you have any questions, ask your nurse or doctor.          acetaminophen  500 MG tablet Commonly known as: TYLENOL  Take 1,000 mg by mouth every 6 (six) hours as needed for mild pain or moderate pain.   aspirin  EC 81 MG tablet Take 81 mg by mouth daily. Swallow whole.   carvedilol  6.25 MG tablet Commonly known as: COREG  Take 1 tablet (6.25 mg total) by mouth 2 (two) times daily with a meal.   cholecalciferol 25 MCG (1000 UNIT) tablet Commonly known as: VITAMIN D3 Take 2,000 Units by mouth daily.   clonazePAM  0.5 MG tablet Commonly known as: KLONOPIN  Take 1 tablet (0.5 mg total) by mouth at bedtime.   estradiol  0.1 MG/GM vaginal cream Commonly known as: ESTRACE  Place 0.5-1g nightly for two weeks then twice a week after   ezetimibe  10 MG tablet Commonly known as: ZETIA  TAKE 1 TABLET(10 MG) BY  MOUTH DAILY   fexofenadine 180 MG tablet Commonly known as: ALLEGRA Take 180 mg by mouth at bedtime.   FLAXSEED OIL PO Take 5-10 mLs by mouth 3 (three) times a week. Power   fluticasone 50 MCG/ACT nasal spray Commonly known as: FLONASE Place 1 spray into both nostrils daily as needed for allergies or rhinitis.   Gemtesa  75 MG Tabs Generic drug: Vibegron  Take 1 tablet (75 mg total) by mouth daily.   hydrALAZINE  25 MG tablet Commonly known as: APRESOLINE  Take 1 tablet (25 mg total) by mouth in the morning and at bedtime.   lidocaine  4 % Place 1 patch onto the skin daily as needed (mild pain). Remove & Discard patch within 12 hours or as directed by MD   melatonin 5 MG Tabs Take 5 mg by mouth at bedtime as needed (Sleep).  methylPREDNISolone  4 MG tablet Commonly known as: MEDROL  Taper from 6 pills po for one day to 1 pill po the last day over 6 days What changed:  when to take this reasons to take this   modafinil  200 MG tablet Commonly known as: PROVIGIL  One po qAM and one po qNoon   nitroGLYCERIN  0.4 MG SL tablet Commonly known as: NITROSTAT  Place 1 tablet (0.4 mg total) under the tongue every 5 (five) minutes as needed for chest pain.   OMEGA 3 500 PO Take 500 mg by mouth daily.   pantoprazole  40 MG tablet Commonly known as: PROTONIX  TAKE 1 TABLET(40 MG) BY MOUTH TWICE DAILY   tiZANidine 4 MG tablet Commonly known as: ZANAFLEX Take 2 mg by mouth 3 (three) times daily as needed.   valsartan 160 MG tablet Commonly known as: DIOVAN Take 1 tablet (160 mg total) by mouth daily.        Review of Systems  Constitutional:  Negative for appetite change, chills, fatigue, fever and unexpected weight change.  HENT:  Negative for congestion, dental problem, ear discharge, ear pain, hearing loss, nosebleeds, postnasal drip, rhinorrhea, sinus pressure, sinus pain, sneezing, sore throat, tinnitus and trouble swallowing.   Eyes:  Negative for pain, discharge, redness,  itching and visual disturbance.  Respiratory:  Negative for cough, chest tightness, shortness of breath and wheezing.   Cardiovascular:  Negative for chest pain, palpitations and leg swelling.  Gastrointestinal:  Negative for abdominal distention, abdominal pain, blood in stool, constipation, diarrhea, nausea and vomiting.  Endocrine: Negative for cold intolerance, heat intolerance, polydipsia, polyphagia and polyuria.  Genitourinary:  Negative for difficulty urinating, dysuria, flank pain, frequency and urgency.  Musculoskeletal:  Positive for arthralgias and gait problem. Negative for back pain, joint swelling, myalgias, neck pain and neck stiffness.  Skin:  Negative for color change, pallor, rash and wound.  Neurological:  Negative for dizziness, syncope, speech difficulty, weakness, light-headedness, numbness and headaches.  Hematological:  Does not bruise/bleed easily.  Psychiatric/Behavioral:  Negative for agitation, behavioral problems, confusion, hallucinations and sleep disturbance. The patient is not nervous/anxious.     Immunization History  Administered Date(s) Administered   Fluad Quad(high Dose 65+) 04/01/2019, 04/19/2021   INFLUENZA, HIGH DOSE SEASONAL PF 04/30/2017, 05/15/2018, 05/18/2020, 05/03/2024   Influenza Whole 05/14/2011   Influenza-Unspecified 03/04/2022   PFIZER(Purple Top)SARS-COV-2 Vaccination 08/27/2019, 09/21/2019, 03/07/2020, 11/16/2020   Pfizer Covid-19 Vaccine Bivalent Booster 47yrs & up 03/04/2022   Pfizer(Comirnaty)Fall Seasonal Vaccine 12 years and older 04/04/2023   Pneumococcal Conjugate-13 08/30/2018   Pneumococcal Polysaccharide-23 03/21/2009, 04/20/2020   Td (Adult), 2 Lf Tetanus Toxid, Preservative Free 12/04/2009   Tdap 11/19/2012   Zoster Recombinant(Shingrix) 04/04/2023, 08/18/2023   Zoster, Live 11/29/2009   Pertinent  Health Maintenance Due  Topic Date Due   DEXA SCAN  11/01/2024 (Originally 12/20/2012)   Influenza Vaccine  Completed    Mammogram  Discontinued      05/22/2023   10:04 AM 07/03/2023    1:17 PM 09/28/2023   10:25 AM 11/02/2023   10:31 AM 03/09/2024    2:43 PM  Fall Risk  Falls in the past year? 1 1 0 0 0  Was there an injury with Fall? 0 1 0 0 0  Fall Risk Category Calculator 2 2 0 0 0  Patient at Risk for Falls Due to History of fall(s) No Fall Risks No Fall Risks No Fall Risks No Fall Risks  Fall risk Follow up  Falls evaluation completed Falls evaluation completed;Education provided;Falls prevention discussed  Falls evaluation completed Falls evaluation completed   Functional Status Survey:    Vitals:   05/03/24 1007  BP: 136/80  Pulse: 63  Resp: 20  Temp: 97.6 F (36.4 C)  SpO2: 98%  Weight: 182 lb 6.4 oz (82.7 kg)  Height: 5' 2 (1.575 m)   Body mass index is 33.36 kg/m. Physical Exam GENERAL: Alert, cooperative, well developed, no acute distress. HEENT: Normocephalic, normal oropharynx, moist mucous membranes, tympanic membranes normal bilaterally, nose normal. NECK: Neck normal. CHEST: Clear to auscultation bilaterally, no wheezes, rhonchi, or crackles. CARDIOVASCULAR: Normal heart rate and rhythm, S1 and S2 normal without murmurs. ABDOMEN: Soft, non-tender, non-distended, without organomegaly, normal bowel sounds, bladder non-tender. EXTREMITIES: No cyanosis or edema, calf muscles non-tender. NEUROLOGICAL: Cranial nerves grossly intact, moves all extremities without gross motor or sensory deficit.  SKIN: No rash,no lesion or erythema   PSYCHIATRY/BEHAVIORAL: Mood stable    Labs reviewed: Recent Labs    11/11/23 0953  NA 141  K 4.2  CL 105  CO2 28  GLUCOSE 91  BUN 13  CREATININE 0.65  CALCIUM  9.4   Recent Labs    08/19/23 1002 11/11/23 0953  AST 19 17  ALT 16 14  ALKPHOS 129*  --   BILITOT 0.4 0.3  PROT 6.0 5.8*  ALBUMIN 3.9  --    Recent Labs    05/22/23 1059 11/11/23 0953  WBC 5.5 3.3*  NEUTROABS 3,982 2,142  HGB 13.9 13.6  HCT 42.5 41.3  MCV 93.0 89.8   PLT 248 224   Lab Results  Component Value Date   TSH 3.60 11/11/2023   Lab Results  Component Value Date   HGBA1C 5.4 06/15/2021   Lab Results  Component Value Date   CHOL 199 02/11/2024   HDL 47 02/11/2024   LDLCALC 124 (H) 02/11/2024   LDLDIRECT 130.2 11/03/2011   TRIG 159 (H) 02/11/2024   CHOLHDL 4.2 02/11/2024    Significant Diagnostic Results in last 30 days:  No results found.  Assessment/Plan Assessment and Plan    Progressive multiple sclerosis Progressive multiple sclerosis with worsening symptoms. Recent steroid injections in hips and back provide temporary relief. Ablation in the back performed but with limited duration of effect. Concerns about exacerbation of MS symptoms with new cholesterol medication.  Mixed hyperlipidemia Mixed hyperlipidemia with fluctuating lipid levels. Total cholesterol increased from 181 in January to 199 in July. Triglycerides increased from 123 to 159. HDL decreased slightly from 49 to 47. LDL increased from 110 to 875. The cardiologist plans to switch losartan  to valsartan due to inadequate 24-hour release and initiate a once-monthly injection (LECVIO) for cholesterol management, pending insurance approval. Concerns about potential exacerbation of MS symptoms with LECVIO, particularly muscle pain. - Await insurance approval for LECVIO - Monitor lipid levels with cardiologist - Continue dietary management  Essential hypertension Essential hypertension with elevated morning blood pressure readings. Cardiologist plans to switch from losartan  to valsartan due to inadequate 24-hour release. - Switch losartan  to valsartan as per cardiologist's plan   Congestive Heart Failure  - No symptoms of fluid overload  - continue on Coreg  and Hydralazine  - continue to monitor  - Follow up with Cardiologist as scheduled   Overactive bladder Overactive bladder symptoms have improved significantly with Gentasva, allowing for better sleep and  complete bladder emptying. - Continue Gentasva for overactive bladder management  General Health Maintenance Pending tetanus and COVID vaccinations. Flu, pneumonia, and shingles vaccinations are up to date. Bone density scan not yet performed due  to equipment issues at the breast center. - Schedule tetanus and COVID vaccinations at pharmacy - Arrange bone density scan at an alternative location - Schedule Medicare visit   Family/ staff Communication: Reviewed plan of care with patient verbalized understanding   Labs/tests ordered: N - CBC with Differential/Platelet - CMP with eGFR([facility]) - TSH  Next Appointment : Return in about 6 months (around 11/01/2024) for medical mangement of chronic issues.SABRA   Spent 30 minutes of Face to face and non-face to face with patient  >50% time spent counseling; reviewing medical record; tests; labs; documentation and developing future plan of care.   Roxan JAYSON Plough, NP

## 2024-05-04 ENCOUNTER — Encounter (HOSPITAL_BASED_OUTPATIENT_CLINIC_OR_DEPARTMENT_OTHER): Payer: Self-pay

## 2024-05-04 ENCOUNTER — Other Ambulatory Visit: Payer: Self-pay | Admitting: Pharmacist Clinician (PhC)/ Clinical Pharmacy Specialist

## 2024-05-04 LAB — CBC WITH DIFFERENTIAL/PLATELET
Absolute Lymphocytes: 1158 {cells}/uL (ref 850–3900)
Absolute Monocytes: 567 {cells}/uL (ref 200–950)
Basophils Absolute: 57 {cells}/uL (ref 0–200)
Basophils Relative: 0.7 %
Eosinophils Absolute: 162 {cells}/uL (ref 15–500)
Eosinophils Relative: 2 %
HCT: 42.2 % (ref 35.0–45.0)
Hemoglobin: 14.1 g/dL (ref 11.7–15.5)
MCH: 30.5 pg (ref 27.0–33.0)
MCHC: 33.4 g/dL (ref 32.0–36.0)
MCV: 91.3 fL (ref 80.0–100.0)
MPV: 10.6 fL (ref 7.5–12.5)
Monocytes Relative: 7 %
Neutro Abs: 6156 {cells}/uL (ref 1500–7800)
Neutrophils Relative %: 76 %
Platelets: 231 Thousand/uL (ref 140–400)
RBC: 4.62 Million/uL (ref 3.80–5.10)
RDW: 13.2 % (ref 11.0–15.0)
Total Lymphocyte: 14.3 %
WBC: 8.1 Thousand/uL (ref 3.8–10.8)

## 2024-05-04 LAB — COMPLETE METABOLIC PANEL WITHOUT GFR
AG Ratio: 2.1 (calc) (ref 1.0–2.5)
ALT: 15 U/L (ref 6–29)
AST: 16 U/L (ref 10–35)
Albumin: 4.2 g/dL (ref 3.6–5.1)
Alkaline phosphatase (APISO): 106 U/L (ref 37–153)
BUN: 16 mg/dL (ref 7–25)
CO2: 27 mmol/L (ref 20–32)
Calcium: 9.7 mg/dL (ref 8.6–10.4)
Chloride: 99 mmol/L (ref 98–110)
Creat: 0.76 mg/dL (ref 0.60–1.00)
Globulin: 2 g/dL (ref 1.9–3.7)
Glucose, Bld: 84 mg/dL (ref 65–99)
Potassium: 4.3 mmol/L (ref 3.5–5.3)
Sodium: 134 mmol/L — ABNORMAL LOW (ref 135–146)
Total Bilirubin: 0.4 mg/dL (ref 0.2–1.2)
Total Protein: 6.2 g/dL (ref 6.1–8.1)

## 2024-05-04 LAB — TSH: TSH: 3.18 m[IU]/L (ref 0.40–4.50)

## 2024-05-05 ENCOUNTER — Telehealth: Payer: Self-pay

## 2024-05-05 NOTE — Telephone Encounter (Signed)
 Dawn [name], patient will be scheduled as soon as possible.  Auth Submission: NO AUTH NEEDED Site of care: Site of care: [facility] Payer: Medicare A/B with [employer] supplement Medication & CPT/J Code(s) submitted: Leqvio (Inclisiran) [id number] Diagnosis Code:  Route of submission (phone, fax, portal):  Phone # Fax # Auth type: Buy/Bill PB Units/visits requested: 284mg  x 2 doses Reference number:  Approval from: 05/05/24 to 08/20/24

## 2024-05-06 ENCOUNTER — Ambulatory Visit: Payer: Self-pay | Admitting: Family

## 2024-05-13 ENCOUNTER — Encounter: Payer: Self-pay | Admitting: Obstetrics

## 2024-05-13 ENCOUNTER — Other Ambulatory Visit: Payer: Self-pay | Admitting: Neurology

## 2024-05-13 ENCOUNTER — Ambulatory Visit (INDEPENDENT_AMBULATORY_CARE_PROVIDER_SITE_OTHER): Admitting: Obstetrics

## 2024-05-13 VITALS — BP 147/78 | HR 68

## 2024-05-13 DIAGNOSIS — N3281 Overactive bladder: Secondary | ICD-10-CM | POA: Diagnosis not present

## 2024-05-13 DIAGNOSIS — N329 Bladder disorder, unspecified: Secondary | ICD-10-CM

## 2024-05-13 DIAGNOSIS — K5900 Constipation, unspecified: Secondary | ICD-10-CM | POA: Diagnosis not present

## 2024-05-13 NOTE — Patient Instructions (Addendum)
 Women should try to eat at least 21 to 25 grams of fiber a day, while men should aim for 30 to 38 grams a day. You can add fiber to your diet with food or a fiber supplement such as psyllium (metamucil), benefiber, or fibercon.   Here's a look at how much dietary fiber is found in some common foods. When buying packaged foods, check the Nutrition Facts label for fiber content. It can vary among brands.  Fruits Serving size Total fiber (grams)*  Raspberries 1 cup 8.0  Pear 1 medium 5.5  Apple, with skin 1 medium 4.5  Banana 1 medium 3.0  Orange 1 medium 3.0  Strawberries 1 cup 3.0   Vegetables Serving size Total fiber (grams)*  Green peas, boiled 1 cup 9.0  Broccoli, boiled 1 cup chopped 5.0  Turnip greens, boiled 1 cup 5.0  Brussels sprouts, boiled 1 cup 4.0  Potato, with skin, baked 1 medium 4.0  Sweet corn, boiled 1 cup 3.5  Cauliflower, raw 1 cup chopped 2.0  Carrot, raw 1 medium 1.5   Grains Serving size Total fiber (grams)*  Spaghetti, whole-wheat, cooked 1 cup 6.0  Barley, pearled, cooked 1 cup 6.0  Bran flakes 3/4 cup 5.5  Quinoa, cooked 1 cup 5.0  Oat bran muffin 1 medium 5.0  Oatmeal, instant, cooked 1 cup 5.0  Popcorn, air-popped 3 cups 3.5  Brown rice, cooked 1 cup 3.5  Bread, whole-wheat 1 slice 2.0  Bread, rye 1 slice 2.0   Legumes, nuts and seeds Serving size Total fiber (grams)*  Split peas, boiled 1 cup 16.0  Lentils, boiled 1 cup 15.5  Black beans, boiled 1 cup 15.0  Baked beans, canned 1 cup 10.0  Chia seeds 1 ounce 10.0  Almonds 1 ounce (23 nuts) 3.5  Pistachios 1 ounce (49 nuts) 3.0  Sunflower kernels 1 ounce 3.0  *Rounded to nearest 0.5 gram. Source: [employer] for [employer], Legacy Release    Resume miralax  and titrate as needed.   Please call [phone] to schedule the earliest appointment for pelvic floor PT.  Please call urology to establish care.

## 2024-05-13 NOTE — Assessment & Plan Note (Signed)
-   trial of Miralax  1 capful without relief, resumed Flaxseed and intermittent senna use - For constipation, we reviewed the importance of a better bowel regimen.  We also discussed the importance of avoiding chronic straining, as it can exacerbate her pelvic floor symptoms; we discussed treating constipation and straining prior to surgery, as postoperative straining can lead to damage to the repair and recurrence of symptoms. We discussed initiating therapy with increasing fluid intake, fiber supplementation, stool softeners, and laxatives such as miralax .   -encouraged to resume titration of miralax  and continue dietary fiber supplementation with squatting position for defecation - referral for pelvic floor PT resent to improve pelvic floor relaxation due to high tone pelvic floor

## 2024-05-13 NOTE — Telephone Encounter (Signed)
 LOV 10/30/23  Next OV 06/14/24  Last refill 11/09/23, #90, 1 refills  Please review, thanks!

## 2024-05-13 NOTE — Progress Notes (Signed)
 Dawn [name]  SUBJECTIVE  History of Present Illness: Dawn [name] is a 78 y.o. female seen in follow-up for neurogenic bladder, multiple sclerosis, spinal stenosis, UTI, history of gross hematuria, and constipation. Plan at last [name] was urodynamics, referral to pelvic floor PT, continue Gemtesa , vaginal estrogen, titration of miralax , referral to urology, and continue fiber supplementation.   Patient reports being pleased with improvement of urinary symptoms Taking a 6 week break from PT for back pain and gait OAB medications tried: oxybutynin  5 mg twice daily, VESIcare , Tamsulosin ,Tolterodine , Myrbetriq , and Gemtesa   Gemtesa  with resolution for nocturia with improved sleep Referred to urology due to cystoscopy 01/26/24 noted villi-like projection in the trigone of bladder mucosa near the urethral opening and a single diverticulum at the right bladder dome.  CT urogram 12/06/23 with Nonobstructing left lower pole renal stones measuring up to 10 x 8 x 11 mm. No right renal, ureteral, or bladder stones. No hydroureteronephrosis., incidental small hiatal hernia.  Continues vaginal estrogen 1g 2x/week Resumed miralax  1 capful/day at night with increased difficulty passing stool, stopped after a few weeks. Resumed Fiber from diet and Flax seed Reports [place] I-V stool 3x/week, mostly type III stool with intermittent I-II.   Urodynamic Impression 11/18/23:  1. Sensation was normal; capacity was normal at 2. Stress Incontinence was not demonstrated at normal pressures; 3. Detrusor Overactivity was not demonstrated leakage. 4. Emptying was dysfunctional with a normal PVR 10mL, a sustained detrusor contraction present,  abdominal straining not present, dyssynergic urethral sphincter activity on EMG.  Pdet at Qmax was 10 cm of water .  Qmax was 16 mL/sec.  Calculated compliance was 37mL/cmH20   Past Medical History: Patient  has a past medical history of Allergy,  Anxiety, CAD (coronary artery disease), Dry eyes, Endometrial polyp, Essential hypertension (08/25/2018), GERD (gastroesophageal reflux disease), History of hiatal hernia, History of kidney stones, Hot flashes, menopausal (10/13/2011), Hyperlipidemia, Jaundice (as teenager), Memory loss (09/21/2019), Multiple sclerosis (dx 2001), Neuropathy, Osteoarthritis, Sjogren's syndrome (dx oct 2021), Stroke (HCC), Vertigo, and Vision abnormalities.   Past Surgical History: She  has a past surgical history that includes Lumbar fusion (2001); Abdominal hysterectomy (2002); Upper gi endoscopy (yrs ago); colonscopy (2011); Dilatation & curettage/hysteroscopy with myosure (N/A, 06/08/2020); Total hip arthroplasty (Right, 01/17/2021); LEFT HEART CATH AND CORONARY ANGIOGRAPHY (N/A, 07/18/2021); CORONARY STENT INTERVENTION (N/A, 07/18/2021); and Colonoscopy (01/27/2022).   Medications: She has a current medication list which includes the following prescription(s): acetaminophen , aspirin  ec, carvedilol , cholecalciferol, clonazepam , estradiol , ezetimibe , fexofenadine, flaxseed (linseed), fluticasone, hydralazine , lidocaine , melatonin, modafinil , nitroglycerin , omega-3 fatty acids, pantoprazole , tizanidine, valsartan, and gemtesa .   Allergies: Patient is allergic to repatha  [evolocumab ], crestor  Dacian.Cram ], pravastatin , and statins.   Social History: Patient  reports that she quit smoking about 40 years ago. Her smoking use included cigarettes. She started smoking about 55 years ago. She has a 7.5 pack-year smoking history. She has never used smokeless tobacco. She reports current alcohol  use. She reports that she does not use drugs.     OBJECTIVE     Physical Exam: Vitals:   05/13/24 0946  BP: (!) 147/78  Pulse: 68   Gen: No apparent distress, A&O x 3.  Detailed Urogynecologic Evaluation:  Deferred. Prior exam showed:    ASSESSMENT AND PLAN    Dawn [name] is a 78 y.o. with:  1. OAB (overactive  bladder)   2. Constipation, unspecified constipation type   3. Lesion of bladder     OAB (overactive bladder) Assessment & Plan: -  11/02/23 catheterized for 60mL with negative UA - We discussed the symptoms of overactive bladder (OAB), which include urinary urgency, urinary frequency, nocturia, with or without urge incontinence.  While we do not know the exact etiology of OAB, several treatment options exist. We discussed management including behavioral therapy (decreasing bladder irritants, urge suppression strategies, timed voids, bladder retraining), physical therapy, medication; for refractory cases posterior tibial nerve stimulation, sacral neuromodulation, and intravesical botulinum toxin injection.  For anticholinergic medications, we discussed the potential side effects of anticholinergics including dry eyes, dry mouth, constipation, cognitive impairment and urinary retention. For Beta-3 agonist medication, we discussed the potential side effect of elevated blood pressure which is more likely to occur in individuals with uncontrolled hypertension. - OAB medications tried: oxybutynin  5 mg twice daily, VESIcare , Tamsulosin ,Tolterodine , Myrbetriq  - Rx Gemtesa  received through assistance - referral to pelvic floor PT and number provided for patient to schedule since she is taking a break from PT  Orders: -     AMB referral to rehabilitation  Constipation, unspecified constipation type Assessment & Plan: - trial of Miralax  1 capful without relief, resumed Flaxseed and intermittent senna use - For constipation, we reviewed the importance of a better bowel regimen.  We also discussed the importance of avoiding chronic straining, as it can exacerbate her pelvic floor symptoms; we discussed treating constipation and straining prior to surgery, as postoperative straining can lead to damage to the repair and recurrence of symptoms. We discussed initiating therapy with increasing fluid intake, fiber  supplementation, stool softeners, and laxatives such as miralax .   -encouraged to resume titration of miralax  and continue dietary fiber supplementation with squatting position for defecation - referral for pelvic floor PT resent to improve pelvic floor relaxation due to high tone pelvic floor  Orders: -     AMB referral to rehabilitation  Lesion of bladder Assessment & Plan: - villi-like projection in the trigone of bladder mucosa near the urethral opening and a single diverticulum at the right bladder dome during cystoscopy 01/26/24 - referral sent to urology and provided number for patient to establish care  Orders: -     Ambulatory referral to Urology  Time spent: I spent 16 minutes dedicated to the care of this patient on the date of this encounter to include pre-[name] review of records, face-to-face time with the patient discussing neurogenic bladder, constipation, bladder lesion, and post [name] documentation and ordering medication/ testing.    Lianne ONEIDA Gillis, MD

## 2024-05-13 NOTE — Assessment & Plan Note (Signed)
-   villi-like projection in the trigone of bladder mucosa near the urethral opening and a single diverticulum at the right bladder dome during cystoscopy 01/26/24 - referral sent to urology and provided number for patient to establish care

## 2024-05-13 NOTE — Assessment & Plan Note (Signed)
-   11/02/23 catheterized for 60mL with negative UA - We discussed the symptoms of overactive bladder (OAB), which include urinary urgency, urinary frequency, nocturia, with or without urge incontinence.  While we do not know the exact etiology of OAB, several treatment options exist. We discussed management including behavioral therapy (decreasing bladder irritants, urge suppression strategies, timed voids, bladder retraining), physical therapy, medication; for refractory cases posterior tibial nerve stimulation, sacral neuromodulation, and intravesical botulinum toxin injection.  For anticholinergic medications, we discussed the potential side effects of anticholinergics including dry eyes, dry mouth, constipation, cognitive impairment and urinary retention. For Beta-3 agonist medication, we discussed the potential side effect of elevated blood pressure which is more likely to occur in individuals with uncontrolled hypertension. - OAB medications tried: oxybutynin  5 mg twice daily, VESIcare , Tamsulosin ,Tolterodine , Myrbetriq  - Rx Gemtesa  received through assistance - referral to pelvic floor PT and number provided for patient to schedule since she is taking a break from PT

## 2024-05-16 ENCOUNTER — Other Ambulatory Visit: Payer: Self-pay

## 2024-05-16 DIAGNOSIS — N3281 Overactive bladder: Secondary | ICD-10-CM

## 2024-05-24 ENCOUNTER — Ambulatory Visit (INDEPENDENT_AMBULATORY_CARE_PROVIDER_SITE_OTHER)

## 2024-05-24 VITALS — BP 171/81 | HR 65 | Temp 99.1°F | Resp 16 | Ht 62.0 in | Wt 182.4 lb

## 2024-05-24 DIAGNOSIS — E7800 Pure hypercholesterolemia, unspecified: Secondary | ICD-10-CM | POA: Diagnosis not present

## 2024-05-24 MED ORDER — INCLISIRAN SODIUM 284 MG/1.5ML ~~LOC~~ SOSY
284.0000 mg | PREFILLED_SYRINGE | Freq: Once | SUBCUTANEOUS | Status: AC
  Administered 2024-05-24: 284 mg via SUBCUTANEOUS
  Filled 2024-05-24: qty 1.5

## 2024-05-24 NOTE — Progress Notes (Signed)
 Diagnosis: Hyperlipidemia  Provider:  Chilton Greathouse MD  Procedure: Injection  Leqvio (inclisiran), Dose: 284 mg, Site: subcutaneous, Number of injections: 1  Injection Site(s): Right arm  Post Care: Observation period completed  Discharge: Condition: Good, Destination: Home . AVS Provided  Performed by:  Wyvonne Lenz, RN

## 2024-05-25 ENCOUNTER — Telehealth (HOSPITAL_BASED_OUTPATIENT_CLINIC_OR_DEPARTMENT_OTHER): Payer: Self-pay | Admitting: Family

## 2024-05-25 DIAGNOSIS — I10 Essential (primary) hypertension: Secondary | ICD-10-CM

## 2024-05-25 DIAGNOSIS — Z5181 Encounter for therapeutic drug level monitoring: Secondary | ICD-10-CM

## 2024-05-25 NOTE — Telephone Encounter (Signed)
  Pt c/o BP issue: STAT if pt c/o blurred vision, one-sided weakness or slurred speech.  STAT if BP is GREATER than 180/120 TODAY.  STAT if BP is LESS than 90/60 and SYMPTOMATIC TODAY  1. What is your BP concern? High BP   2. Have you taken any BP medication today? Yes    3. What are your last 5 BP readings?170s systolic   4. Are you having any other symptoms (ex. Dizziness, headache, blurred vision, passed out)? Feeling like her heart is beating fast, sometime feels SOB when she is up doing something since she started Valsartan    Patient said her BP is still elevated specially in the morning, even after switching from losartan  to valsartan. She said, she even tried switching to take valsartan the evening if that will help her BP still the same. Systolic is at 170s.

## 2024-05-25 NOTE — Telephone Encounter (Signed)
 Increase Valsartan to 320 mg daily. BMET in 7-10 days.   Reche Finder, NP

## 2024-05-25 NOTE — Telephone Encounter (Signed)
 Spoke with patient regarding her blood pressure  Her Valsartan was increased to 160 mg daily, has been taking in the evening.  Her SBP has been running 170's  Does go down in the evening to 120's  Yesterday when she went to get infusion SBP 176 and down to 171 after a while  Today at 9:30 am 160, 12 noon 150  Feels her heart beating in chest and sometime in ears when her blood pressure is elevated  Will forward to Reche ORN NP for review

## 2024-05-26 MED ORDER — VALSARTAN 160 MG PO TABS: 320.0000 mg | ORAL_TABLET | Freq: Every day | ORAL | Status: DC

## 2024-05-26 NOTE — Telephone Encounter (Signed)
 Spoke with patient and reviewed recommendations, verbalized understanding  She will call back in next 1 to 2 weeks with updated blood pressure readings, will send in updated Rx at that time

## 2024-05-27 DIAGNOSIS — D414 Neoplasm of uncertain behavior of bladder: Secondary | ICD-10-CM | POA: Diagnosis not present

## 2024-05-27 DIAGNOSIS — N200 Calculus of kidney: Secondary | ICD-10-CM | POA: Diagnosis not present

## 2024-06-02 DIAGNOSIS — Z5181 Encounter for therapeutic drug level monitoring: Secondary | ICD-10-CM | POA: Diagnosis not present

## 2024-06-02 DIAGNOSIS — I10 Essential (primary) hypertension: Secondary | ICD-10-CM | POA: Diagnosis not present

## 2024-06-02 LAB — BASIC METABOLIC PANEL WITH GFR
BUN/Creatinine Ratio: 23 (ref 12–28)
BUN: 17 mg/dL (ref 8–27)
CO2: 24 mmol/L (ref 20–29)
Calcium: 9.5 mg/dL (ref 8.7–10.3)
Chloride: 101 mmol/L (ref 96–106)
Creatinine, Ser: 0.74 mg/dL (ref 0.57–1.00)
Glucose: 82 mg/dL (ref 70–99)
Potassium: 4.8 mmol/L (ref 3.5–5.2)
Sodium: 139 mmol/L (ref 134–144)
eGFR: 84 mL/min/1.73 (ref >59)

## 2024-06-03 ENCOUNTER — Ambulatory Visit (HOSPITAL_BASED_OUTPATIENT_CLINIC_OR_DEPARTMENT_OTHER): Payer: Self-pay | Admitting: Family

## 2024-06-06 ENCOUNTER — Other Ambulatory Visit

## 2024-06-06 ENCOUNTER — Other Ambulatory Visit: Payer: Self-pay

## 2024-06-06 DIAGNOSIS — E871 Hypo-osmolality and hyponatremia: Secondary | ICD-10-CM

## 2024-06-06 LAB — BASIC METABOLIC PANEL WITH GFR
BUN: 16 mg/dL (ref 7–25)
CO2: 27 mmol/L (ref 20–32)
Calcium: 9 mg/dL (ref 8.6–10.4)
Chloride: 103 mmol/L (ref 98–110)
Creat: 0.73 mg/dL (ref 0.60–1.00)
Glucose, Bld: 81 mg/dL (ref 65–139)
Potassium: 4.5 mmol/L (ref 3.5–5.3)
Sodium: 137 mmol/L (ref 135–146)
eGFR: 85 mL/min/1.73m2 (ref >=60)

## 2024-06-07 ENCOUNTER — Ambulatory Visit: Payer: Self-pay | Admitting: Family

## 2024-06-14 ENCOUNTER — Ambulatory Visit: Admitting: Neurology

## 2024-06-14 ENCOUNTER — Encounter: Payer: Self-pay | Admitting: Neurology

## 2024-06-14 VITALS — BP 138/82 | HR 64 | Resp 15 | Ht 62.0 in | Wt 183.0 lb

## 2024-06-14 DIAGNOSIS — I639 Cerebral infarction, unspecified: Secondary | ICD-10-CM

## 2024-06-14 DIAGNOSIS — R269 Unspecified abnormalities of gait and mobility: Secondary | ICD-10-CM | POA: Diagnosis not present

## 2024-06-14 DIAGNOSIS — M3509 Sicca syndrome with other organ involvement: Secondary | ICD-10-CM

## 2024-06-14 DIAGNOSIS — Z79899 Other long term (current) drug therapy: Secondary | ICD-10-CM

## 2024-06-14 DIAGNOSIS — M2550 Pain in unspecified joint: Secondary | ICD-10-CM | POA: Diagnosis not present

## 2024-06-14 DIAGNOSIS — G35C1 Active secondary progressive multiple sclerosis: Secondary | ICD-10-CM | POA: Diagnosis not present

## 2024-06-14 DIAGNOSIS — M4306 Spondylolysis, lumbar region: Secondary | ICD-10-CM

## 2024-06-14 MED ORDER — LEFLUNOMIDE 20 MG PO TABS: 20.0000 mg | ORAL_TABLET | Freq: Every day | ORAL | 3 refills | Status: AC

## 2024-06-14 NOTE — Progress Notes (Signed)
 [facility]  PATIENT: Dawn [name] DOB: 03-15-1946  REFERRING CLINICIAN: Lamar Ng HISTORY FROM: Patient  REASON FOR VISIT: MS   HISTORICAL  CHIEF COMPLAINT:  Chief Complaint  Patient presents with   Multiple Sclerosis    Rm11 Pt presents fr follow up on Multiple sclerosis, gait , Lumbar spondylolysis, Sjogren's syndrome with other organ involvement, Multiple joint pain, Transient vision disturbance , & Vertigo      HISTORY OF PRESENT ILLNESS:  Dawn [name] is a 78 y.o. woman who was diagnosed with multiple sclerosis in 2001.      Update 06/14/2024  She stopped Leflunomide  for Sjogren's and MS in 2024.   She still has dry eyes.   Although she has not had any exacerbations, she felt her MS may have progressed more slowly while she was on the leflunomide  and is asking about getting back on.  Remains on ASA for CVA.  No new CVA/TIA symptm  She notes gait, memory and fatigue are worse now tha in the past    Gait and balance are poor and she is using the walker constantly now.  She also has pain that affects her gait..   She is noting numbness but less pain in her hands.  At night, she gets painful tingling in her legs.   She has urianry urgency with some incontinence.    She has had a couple falls due to lightheadedness       She she continues to experience some back pain and right greater than left leg pain.  She had a nerve block/RFA for her back in 2023    She has had several injections by Dr. Ibazebo.  Initially they helped but less so more recently.    She stopped taking a statin and will be switched to Repatha .  She feels better off the statin.     She has coronary artery disease.  She had a cardiac stent placed after presenting with chest pain and having a cardiac catheter one plus year ago.   She had been on ASA/Plavix   x 1 year but was on just ASA at the time of the stroke She was already on carvedilol  and dose was increased.  . She also has  CHF.  They are changing her Crestor  to Repatha .    She is on several BP med's:  carvedilol , hydralazone, losartan .        She feels her cognition is a little worse than earlier this year.      She notes some memory difficulties and wrd finding issues/.     She denies anxiety or depression at this time.   She has fatigue and takes 1 Provigil  every morning and sometimes another 1/2 pill in the afternoon.     Clonazepam  helps insomnia some and spasticity at night as much as it used to.   It helps her fall asleep but then she wakes up 3 hours later.     Stroke History: She had a left medial parietal lobe stroke in September 2023.  The week before, she noted difficulty with reading and also had mild word finding difficulty.   Reading has returned to normal but she still notes mild word finding issues - though much better.  We checked a carotid ultrasoud last week 05/21/2022 and it was normal for age.   Many years ago, she had a left PICA cerebellar stroke and we had started ASA _______________________________________________ MS History:    In 1994, she had an episode  of severe vertigo with gait ataxia. An MRI at that time was reportedly normal. The symptoms were felt to be due to allergies. About 7 years later she had vertigo, gait ataxia and diplopia. Also her left leg was giving out. Short after that she had a hysterectomy. Postoperatively, she was numb in both legs and she had a lumbar puncture which showed changes consistent with MS. Then, an MRI of the brain was performed showing changes consistent with MS. She was referred to Dr. Maurice who her on Betaseron. Last year, she switched from Betaseron to Tecfidera, in the hope that she would be able to avoid shots as she was having severe skin reactions with knots.    However, she had a lot of difficulty tolerating Tecfidera and swithced to Plegridy  in early 2016 and to Aubagio  late 2016.  Switched to Leflunomide  for insurance reasons in 2019.    She stopped  leflunomide  in 2023.    Imaging: MRI of the lumbar spine 09/01/2022 showed multilevel spondylosis similar to the 05/24/2021 MRI.  There is left lateral recess stenosis and left foraminal narrowing at L2-L3.  There is mild spinal stenosis at L3-L4 and L4-L5.  She has had L5-S1 posterior decompression and has moderate to severe right foraminal narrowing.  MRI brain 05/17/2022 showed a subacute stroke in the deep white matter of the posterior frontal/parietal lobe.  Chronic infarction in the left cerebellum and scattered T2/FLAIR hyperintense foci likely representing a combination of chronic demyelinating plaque and chronic microvascular ischemic change.  These were stable.  MRI brain 11/17/2019 showed Remote left PICA distribution CVA in the left cerebellar hemisphere.  This appears unchanged compared to the 2020 MRI.      Scattered T2/FLAIR hyperintense foci in the hemispheres.  Some foci are juxtacortical and periventricular consistent with multiple sclerosis though other foci are more nonspecific and could also be seen with chronic microvascular ischemic change.  None of the foci appear to be acute.  There are no new lesions compared to the 09/13/2018 MRI.  MRI cervical spine in 2021 showed a normal spinal cord.     At C5-C6 there is a right paramedian disc protrusion causing mild right foraminal narrowing and mild spinal stenosis but no nerve root compression.   Minimal degenerative changes at C2-C3 and C3-C4 detailed above that do not lead to spinal stenosis or nerve root compression.   MRI brain 09/13/2018 shows multiple T2/flair hyperintense foci in the hemispheres.  The pattern is consistent with chronic demyelinating plaque associated with multiple sclerosis though some foci likely also represent chronic microvascular ischemic change.  None of the foci enhance or appear to be acute and there is no change compared to the 2017 MRI.      Remote left cerebellar infarction.      There is a normal  enhancement pattern and no acute findings.  REVIEW OF SYSTEMS:  Constitutional: No fevers, chills, sweats, or change in appetite.   Reports fatigue Eyes: No visual changes, double vision, eye pain Ear, nose and throat: No hearing loss, ear pain, nasal congestion, sore throat Cardiovascular: No chest pain, palpitations Respiratory:  No shortness of breath at rest or with exertion.   No wheezes GastrointestinaI: No nausea, vomiting, diarrhea, abdominal pain, fecal incontinence Genitourinary:  see above. Musculoskeletal:  Pain in left greater than right hip Integumentary: No rash, pruritus.    Neurological: as above Psychiatric: No depression at this time.  No anxiety Endocrine: No palpitations, diaphoresis, change in appetite, change in weigh or increased  thirst Hematologic/Lymphatic:  No anemia, purpura, petechiae. Allergic/Immunologic: No itchy/runny eyes, nasal congestion, recent allergic reactions, rashes  ALLERGIES: Allergies  Allergen Reactions   Repatha  [Evolocumab ] Itching   Crestor  [Rosuvastatin ]     MYALGIA    Pravastatin     Statins Other (See Comments)    Muscle pain; Tolerates Crestor      HOME MEDICATIONS: Outpatient Medications Prior to Visit  Medication Sig Dispense Refill   acetaminophen  (TYLENOL ) 500 MG tablet Take 1,000 mg by mouth every 6 (six) hours as needed for mild pain or moderate pain.     aspirin  EC 81 MG tablet Take 81 mg by mouth daily. Swallow whole.     carvedilol  (COREG ) 6.25 MG tablet Take 1 tablet (6.25 mg total) by mouth 2 (two) times daily with a meal. 180 tablet 3   cholecalciferol (VITAMIN D ) 25 MCG (1000 UNIT) tablet Take 2,000 Units by mouth daily.     clonazePAM  (KLONOPIN ) 0.5 MG tablet TAKE 1 TABLET(0.5 MG) BY MOUTH AT BEDTIME 90 tablet 1   estradiol  (ESTRACE ) 0.1 MG/GM vaginal cream Place 0.5-1g nightly for two weeks then twice a week after 30 g 3   ezetimibe  (ZETIA ) 10 MG tablet TAKE 1 TABLET(10 MG) BY MOUTH DAILY 90 tablet 1    fexofenadine (ALLEGRA) 180 MG tablet Take 180 mg by mouth at bedtime.      Flaxseed, Linseed, (FLAXSEED OIL PO) Take 5-10 mLs by mouth 3 (three) times a week. Power     fluticasone (FLONASE) 50 MCG/ACT nasal spray Place 1 spray into both nostrils daily as needed for allergies or rhinitis.     hydrALAZINE  (APRESOLINE ) 25 MG tablet Take 1 tablet (25 mg total) by mouth in the morning and at bedtime. 180 tablet 3   inclisiran (LEQVIO ) 284 MG/1.5ML SOSY injection Inject 284 mg into the skin every 6 (six) months.     Lidocaine  4 % PTCH Place 1 patch onto the skin daily as needed (mild pain). Remove & Discard patch within 12 hours or as directed by MD     melatonin 5 MG TABS Take 5 mg by mouth at bedtime as needed (Sleep).     modafinil  (PROVIGIL ) 200 MG tablet One po qAM and one po qNoon 180 tablet 1   nitroGLYCERIN  (NITROSTAT ) 0.4 MG SL tablet Place 1 tablet (0.4 mg total) under the tongue every 5 (five) minutes as needed for chest pain. 30 tablet 0   Omega-3 Fatty Acids (OMEGA 3 500 PO) Take 500 mg by mouth daily.     pantoprazole  (PROTONIX ) 40 MG tablet TAKE 1 TABLET(40 MG) BY MOUTH TWICE DAILY 180 tablet 3   tiZANidine (ZANAFLEX) 4 MG tablet Take 2 mg by mouth 3 (three) times daily as needed.     valsartan  (DIOVAN ) 320 MG tablet Take 320 mg by mouth daily.     Vibegron  (GEMTESA ) 75 MG TABS Take 1 tablet (75 mg total) by mouth daily.     valsartan  (DIOVAN ) 160 MG tablet Take 2 tablets (320 mg total) by mouth daily.     No facility-administered medications prior to visit.    PAST MEDICAL HISTORY: Past Medical History:  Diagnosis Date   Allergy    Anxiety    CAD (coronary artery disease)    s/p DES to RCA in 06/2021   Dry eyes    Endometrial polyp    Essential hypertension 08/25/2018   GERD (gastroesophageal reflux disease)    History of hiatal hernia    History of kidney stones  Hot flashes, menopausal 10/13/2011   Estradiol  started    Hyperlipidemia    Jaundice as teenager   no  problems since   Memory loss 09/21/2019     1/2 of feet numb all the time   Multiple sclerosis dx 2001   Neuropathy    bilateral feet   Osteoarthritis    Sjogren's syndrome dx oct 2021   sore muscvles, dry mouth and eyes   Stroke (HCC)    Vertigo    Vision abnormalities     PAST SURGICAL HISTORY: Past Surgical History:  Procedure Laterality Date   ABDOMINAL HYSTERECTOMY  2002   partial   COLONOSCOPY  01/27/2022   2 day prep   colonscopy  2011   CORONARY STENT INTERVENTION N/A 07/18/2021   Procedure: CORONARY STENT INTERVENTION;  Surgeon: Dann Candyce RAMAN, MD;  Location: [facility];  Service: Cardiovascular;  Laterality: N/A;   DILATATION & CURETTAGE/HYSTEROSCOPY WITH MYOSURE N/A 06/08/2020   Procedure: DILATATION & CURETTAGE/HYSTEROSCOPY/Polypectomy WITH MYOSURE;  Surgeon: Rendell Calton LABOR, DO;  Location: [facility];  Service: Gynecology;  Laterality: N/A;   LEFT HEART CATH AND CORONARY ANGIOGRAPHY N/A 07/18/2021   Procedure: LEFT HEART CATH AND CORONARY ANGIOGRAPHY;  Surgeon: Dann Candyce RAMAN, MD;  Location: [facility] INVASIVE CV LAB;  Service: Cardiovascular;  Laterality: N/A;   LUMBAR FUSION  2001   TOTAL HIP ARTHROPLASTY Right 01/17/2021   Procedure: TOTAL HIP ARTHROPLASTY ANTERIOR APPROACH;  Surgeon: Fidel Rogue, MD;  Location: [facility];  Service: Orthopedics;  Laterality: Right;   UPPER GI ENDOSCOPY  yrs ago    FAMILY HISTORY: Family History  Problem Relation Age of Onset   Heart failure Mother    Heart failure Father    Arthritis Other    Hypertension Other    Colon cancer Neg Hx    Colon polyps Neg Hx    Esophageal cancer Neg Hx    Stomach cancer Neg Hx    Rectal cancer Neg Hx    Bladder Cancer Neg Hx    Uterine cancer Neg Hx    Kidney cancer Neg Hx     SOCIAL HISTORY:  Social History   Socioeconomic History   Marital status: Widowed    Spouse name: Not on file   Number of children: Not on file   Years of education: Not on  file   Highest education level: Not on file  Occupational History   Occupation: retired  Tobacco Use   Smoking status: Former    Current packs/day: 0.00    Average packs/day: 0.5 packs/day for 15.0 years (7.5 ttl pk-yrs)    Types: Cigarettes    Start date: 08/04/1968    Quit date: 08/05/1983    Years since quitting: 40.8   Smokeless tobacco: Never  Vaping Use   Vaping status: Never Used  Substance and Sexual Activity   Alcohol  use: Yes    Comment: rarely, maybe yearly drink   Drug use: Never   Sexual activity: Not Currently    Birth control/protection: Post-menopausal  Other Topics Concern   Not on file  Social History Narrative   Regular Exercise-no   Widowed   Retired   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     Tobacco use, amount per day now: former   Past tobacco use, amount per  day: 1/2-1 packet   How many years did you use tobacco: 12 years   Alcohol  use (drinks per week): 0   Diet: mediterranean based   Do you drink/eat things with caffeine: yes   Marital status:        widow                          What year were you married? 1987   Do you live in a house, apartment, assisted living, condo, trailer, etc.? house   Is it one or more stories? 1 story   How many persons live in your home? 1   Do you have pets in your home?( please list) no   Current or past profession: therapist, music    Do you exercise?          yes                        Type and how often? Tai chi and pt therapy, stretches etc....   Do you have a living will? yes   Do you have a DNR form?      no                             If not, do you want to discuss one? yes   Do you have signed POA/HPOA forms?      no                  If so, please bring to you appointment   Social Drivers of Health   Financial Resource Strain: Not on file   Food Insecurity: No Food Insecurity (01/07/2022)   Hunger Vital Sign    Worried About Running Out of Food in the Last Year: Never true    Ran Out of Food in the Last Year: Never true  Transportation Needs: No Transportation Needs (01/07/2022)   PRAPARE - Administrator, Civil Service (Medical): No    Lack of Transportation (Non-Medical): No  Physical Activity: Not on file  Stress: Not on file  Social Connections: Not on file  Intimate Partner Violence: Not At Risk (02/03/2022)   Humiliation, Afraid, Rape, and Kick questionnaire    Fear of Current or Ex-Partner: No    Emotionally Abused: No    Physically Abused: No    Sexually Abused: No     PHYSICAL EXAM  Vitals:   06/14/24 1120  BP: 138/82  Pulse: 64  Resp: 15  SpO2: 98%  Weight: 183 lb (83 kg)  Height: 5' 2 (1.575 m)    Body mass index is 33.47 kg/m.   General: The patient is well-developed and well-nourished and in no acute distress.   Has cerumen in left ear canal partially blocking view of TM.   Musculoskeletal: She has lumbar tenderness and trochanteric bursa tenderness.  Neurologic Exam  Mental status: The patient is alert and oriented x 3 at the time of the examination.   apparently normal attention span, stm and concentration ability.   Speech is normal now.   .      Cranial nerves: Extraocular movements are full.  Visual fields are normal.  Facial strength and sensory are normal.  Hearing reduced on left .  Weber is midline.    Motor:  Muscle bulk is normal but tone is symmetric, more on the left.. Strength  is 4/5 in median innervated and 4+/5 and ulnar innervated intrinsic hand muscles.      Sensory:   Sensory testing is intact to touch and vibration in the arms.  She has reduced vibration sensation in the feet and ankles.  Coordination: Finger-nose-finger and heel-to-shin is performed better on the right than the left.  Gait and station: Station is normal.  She has a wide gait.  She is unable  to do tandem walk.  The Romberg sign is borderline.  Reflexes: Deep tendon reflexes are symmetric and normal bilaterally.      DIAGNOSTIC DATA (LABS, IMAGING, TESTING) - I reviewed patient records, labs, notes, testing and imaging myself where available.  Lab Results  Component Value Date   WBC 8.1 05/03/2024   HGB 14.1 05/03/2024   HCT 42.2 05/03/2024   MCV 91.3 05/03/2024   PLT 231 05/03/2024      Component Value Date/Time   NA 137 06/06/2024 0939   NA 139 06/02/2024 1313   K 4.5 06/06/2024 0939   CL 103 06/06/2024 0939   CO2 27 06/06/2024 0939   GLUCOSE 81 06/06/2024 0939   BUN 16 06/06/2024 0939   BUN 17 06/02/2024 1313   CREATININE 0.73 06/06/2024 0939   CALCIUM  9.0 06/06/2024 0939   PROT 6.2 05/03/2024 1059   PROT 6.0 08/19/2023 1002   ALBUMIN 3.9 08/19/2023 1002   AST 16 05/03/2024 1059   AST 20 03/09/2018 1342   ALT 15 05/03/2024 1059   ALT 22 03/09/2018 1342   ALKPHOS 129 (H) 08/19/2023 1002   BILITOT 0.4 05/03/2024 1059   BILITOT 0.4 08/19/2023 1002   BILITOT 0.4 03/09/2018 1342   GFRNONAA >60 05/14/2022 1720   GFRNONAA 91 01/25/2021 1527   GFRAA 105 01/25/2021 1527   Lab Results  Component Value Date   CHOL 199 02/11/2024   HDL 47 02/11/2024   LDLCALC 124 (H) 02/11/2024   LDLDIRECT 130.2 11/03/2011   TRIG 159 (H) 02/11/2024   CHOLHDL 4.2 02/11/2024   Lab Results  Component Value Date   HGBA1C 5.4 06/15/2021   No results found for: VITAMINB12 Lab Results  Component Value Date   TSH 3.18 05/03/2024       ASSESSMENT AND PLAN  Active secondary progressive multiple sclerosis - Plan: Ambulatory referral to Physical Therapy  Abnormality of gait  High risk medication use  Sjogren's syndrome with other organ involvement  Multiple joint pain  Lumbar spondylolysis  Cerebrovascular accident (CVA), unspecified mechanism (HCC)   1.    She would like to restart leflunomide  .  We discussed that we could do this.  She would need to redo  the monthly liver function test for 6 months.  I sent the prescription in and placed the order for the test.   2.    Detrol  and Gemtesa  or bladder.     3.    Continue clonazepam  and modafinil .    4.    Try to stay active.   Continue PT 5.    Return to see me in about 6 months but is advised to call sooner if she has new or worsening neurologic symptoms.  This visit is part of a comprehensive longitudinal care medical relationship regarding the patients primary diagnosis of MS and related concerns.       Dawn Janice A. Vear, MD, PhD 06/14/2024, 12:07 PM Certified in Neurology, Clinical Neurophysiology, Sleep Medicine, Pain Medicine and Neuroimaging  [facility] [address], [address] [place], [place] [address] [phone]-' \

## 2024-06-19 ENCOUNTER — Telehealth: Payer: Self-pay | Admitting: Student

## 2024-06-19 NOTE — Telephone Encounter (Addendum)
   Patient called after-hours Answering Service with concerns about a medication. Tried to call patient back twice but went straight to voicemail. Left message asking her to call back.  Alfreda Hammad E Dynasia Kercheval, PA-C 06/19/2024 7:40 AM  ADDENDUM:   Patient called back.  She reported swelling in her left hand and primarily of her left thumb that started yesterday.  She states it is very painful but she denies any erythema or warmth to the touch.  She is concerned that this is due to her Valsartan .  She was switched from Losartan  to Valsartan  back in October and then Valsartan  was increased to 320 mg daily on 05/25/2024.  I tried to reassure patient that I do not think that isolated hand swelling is due to the Valsartan . It sounds more inflammatory/ infectious. She denies any history of gout and denies any recent injury to that hand.  I recommended she go and have this looked at today at an Urgent Care or ED if she is having significant pain/ swelling or following up with her PCP tomorrow if she does not feel like she needs to be seen urgently today.   She is still concerned about the Valsartan .  She reports increased shortness of breath with activity and palpitations since switching to Valsartan .  She states her heart rates have been all over the place and have been >100 recently. Most recently, heart rates in the low 100s.  Of note, heart rate was 63 at last office visit on 05/02/2024.  Wonder if she is in an arrhythmia such as atrial fibrillation.  She is on Coreg  6.25mg  twice daily as well. Hesitant to increase this without updated EKG given she reports a variable heart rate and the fact that heart rate was in the low 60s at last visit. She denies any shortness of breath at rest, chest pain, lightheadedness/dizziness, or syncope.   I think she needs an office visit as soon as possible and an EKG.  I will send this message to our scheduling team to see if they can help arrange this. Reviewed ED precautions.    She still has the Losartan  that she was taking at home.  Therefore, I advised her that she can stop her Valsartan  and go back to taking her Losartan  100mg  daily.  I reiterated that I do not think her Valsartan  is causing her isolated left hand swelling but she reports she has had a lot of other issues since starting it.  I encouraged her to continue to keep a log of her BP and bring this to her visit.  Will route this to Reche Finder, NP, who has been managing her BP recently in case she has any other recommendations.  Sammye Staff E Lonzell Dorris, PA-C 06/19/2024 9:26 AM

## 2024-06-20 NOTE — Telephone Encounter (Signed)
 Returned a call back to the pt.   Pt aware that per Reche Finder, NP, low suspicion that valsartan  or losartan  is related to her hand swelling.   Advised the pt she should be taking recommended losartan  100 mg po daily, as discussed with the on-call over the weekend.   Went ahead and moved the pts appt up with our office to tomorrow 12/2 at 3:10 pm with Reche Finder, NP.  Cancelled her 12/18 appt with Dr. Raford.  Advised her to arrive 10-15 mins prior to this appt.   Pt aware we will do an EKG and reassess her BP/symptoms at tomorrows office visit appt with Reche Finder, NP.   Pt verbalized understanding and agrees with this plan.

## 2024-06-20 NOTE — Telephone Encounter (Signed)
  Patient returned call to answering service. I did call patient to make a follow up appt as recommended by Caitlin. Patient would like to discuss meds before appt.

## 2024-06-20 NOTE — Telephone Encounter (Signed)
 Agree with Callie, low suspicion her isolated hand swelling was related to Losartan . Please assist to schedule OV for EKG and reassessment of BP with Dr. Raford or APP.   Evian Derringer S Angi Goodell, NP

## 2024-06-21 ENCOUNTER — Encounter (HOSPITAL_BASED_OUTPATIENT_CLINIC_OR_DEPARTMENT_OTHER): Payer: Self-pay | Admitting: Family

## 2024-06-21 ENCOUNTER — Ambulatory Visit

## 2024-06-21 ENCOUNTER — Ambulatory Visit (INDEPENDENT_AMBULATORY_CARE_PROVIDER_SITE_OTHER): Admitting: Family

## 2024-06-21 ENCOUNTER — Other Ambulatory Visit (INDEPENDENT_AMBULATORY_CARE_PROVIDER_SITE_OTHER)

## 2024-06-21 VITALS — BP 142/80 | HR 69 | Ht 62.0 in | Wt 181.0 lb

## 2024-06-21 DIAGNOSIS — E785 Hyperlipidemia, unspecified: Secondary | ICD-10-CM

## 2024-06-21 DIAGNOSIS — I2510 Atherosclerotic heart disease of native coronary artery without angina pectoris: Secondary | ICD-10-CM

## 2024-06-21 DIAGNOSIS — I10 Essential (primary) hypertension: Secondary | ICD-10-CM

## 2024-06-21 DIAGNOSIS — R002 Palpitations: Secondary | ICD-10-CM

## 2024-06-21 DIAGNOSIS — R42 Dizziness and giddiness: Secondary | ICD-10-CM | POA: Diagnosis not present

## 2024-06-21 DIAGNOSIS — R0602 Shortness of breath: Secondary | ICD-10-CM | POA: Diagnosis not present

## 2024-06-21 MED ORDER — LOSARTAN POTASSIUM 100 MG PO TABS: 100.0000 mg | ORAL_TABLET | Freq: Every day | ORAL | Status: AC

## 2024-06-21 NOTE — Progress Notes (Signed)
 Cardiology Office Note:  .   Date:  06/21/2024  ID:  Dawn [name], DOB 10-11-1945, MRN [id number] PCP: Dawn Roxan BROCKS, NP  [facility] Providers Cardiologist:  Dawn Ronal BRAVO, MD (Inactive)    History of Present Illness: Dawn   ANGELIK [name] is a 78 y.o. female MS, Sjogren's, prior CVA, HLD, hypertension, coronary artery disease  MRI 04/2022 with subacute stroke in left posterior frontal lobe. Subsequent carotid duplex, monitor unremarkable. Admitted with hypertensive urgency 06/14/2021.  Echo 05/2021 LVEF 60 to 65%, no RWMA, mild LVH, grade 1 diastolic dysfunction, mild MR, mild to moderate mitral annular calcification, mild calcification of aortic valve.  Subsequent CTA 07/09/2021 with severe stenosis with subsequent LHC 07/18/2021 with PCI-mid RAD.  Otherwise nonobstructive disease (25% LAD, 25% LCx, L-R collaterals).  Seen 06/2024 by PharmD, incliseron and zetia  discussed and zetia  initiated.   At visit 05/02/24 she was staying as active as possible despite MS and feeling well. BP elevated, Losartan  changed to Valsartan  160mg  daily. As LDL not at goal, Leqvio  prior auth initiated. Initial Leqvio  injection on 05/24/24. On 05/25/24 Valsartan  increased to 320mg  daily for BP control.   She contacted the office 06/19/24 noting left hand swelling and dyspnea which she attributed to Valsartan  - on call provider reviewed this would be atypical side effect but permissible to reutrn to Losartan  100mg  daily. She also noted palpitations and HR >100 bpm, she was advised OV for EKG.  Presents today for follow up. Reports some shortness of breath even with talking or with ambulation. She did have some orthopnea with getting into bed the past few days and has had some PND. She also reports Thumping in her right temporal region. Saturday had sudden swelling in her left hand limiting her ability to make a fist. No known injury. Her blood pressure has been fluctuating. She is checking with wrist  cuff at home which was not previously checked for accuracy. Her home cuff was found to be very off the first time and minorly off the second time per her report. She notes palpitations described as thumping in her chest, right ear, and in her temple. Occurs at rest or with activity. Highest heart rate on her wrist monitor of 122 bpm. BP by her wrist cuff 106-140s. She reports feeling lightheaded but no spinning/dizziness. Her MS symptoms recently have been progressive. Her neurologist recently resumed Leflunomide .  Home cuff found to read 10 points high systolic  Previous antihypertensives Valsartan  - L hand swelling 2 months after starting medication  ROS: Please see the history of present illness.    All other systems reviewed and are negative.   Studies Reviewed: Dawn   EKG Interpretation Date/Time:  Tuesday June 21 2024 15:18:19 EST Ventricular Rate:  69 PR Interval:  136 QRS Duration:  74 QT Interval:  386 QTC Calculation: 413 R Axis:   25  Text Interpretation: Normal sinus rhythm Normal ECG Confirmed by Dawn [name] ([id number]) on 06/21/2024 3:23:18 PM    Cardiac Studies & Procedures   ______________________________________________________________________________________________ CARDIAC CATHETERIZATION  CARDIAC CATHETERIZATION 07/18/2021  Conclusion   Mid Cx lesion is 25% stenosed.   Prox LAD to Mid LAD lesion is 25% stenosed.   Prox RCA to Mid RCA lesion is 99% stenosed.  Left-to-right collaterals.   A drug-eluting stent was successfully placed using a STENT ONYX FRONTIER 3.0X30, postdilated to 3.5 mm.   Post intervention, there is a 0% residual stenosis.   The left ventricular systolic function is normal.  LV end diastolic pressure is normal.   The left ventricular ejection fraction is 55-65% by visual estimate.   There is no aortic valve stenosis.  Successful PCI of the mid RCA.  Brisk left to right collaterals.  It is likely that she had a ruptured plaque in  November and developed collaterals since that time.  Continue aggressive secondary prevention.  Plavix  was used for antiplatelet therapy in the Cath Lab.  We will need to see with her other multiple sclerosis medicines, if Brilinta  may be a better choice due to drug interactions.  We will also have to check on which statin would not cause drug interaction.  Of note, she had significant right radial vasospasm.  This required additional verapamil  and nitroglycerin  in the radial artery.  We also used a heating pad on her right forearm.  She required 4 mg of Versed  and 100 mg of fentanyl  for sedation and this also seemed to help with the vasospasm.  Addendum: After review with the Pharm.D., Brilinta  would be a better choice than clopidogrel .  We will send her home with a supply of Brilinta  after 180 mg loading dose.  Findings Coronary Findings Diagnostic  Dominance: Right  Left Anterior Descending Prox LAD to Mid LAD lesion is 25% stenosed.  Left Circumflex Mid Cx lesion is 25% stenosed.  Right Coronary Artery Prox RCA to Mid RCA lesion is 99% stenosed. Brisk left to right collaterals.  Third Right Posterolateral Branch Collaterals 3rd RPL filled by collaterals from Dist Cx.  Intervention  Prox RCA to Mid RCA lesion Stent CATH LAUNCHER 6FR JR4 guide catheter was inserted. Lesion crossed with guidewire using a WIRE ASAHI PROWATER 180CM. Pre-stent angioplasty was performed using a BALLN SAPPHIRE 2.0X15. A drug-eluting stent was successfully placed using a STENT ONYX FRONTIER 3.0X30. Stent strut is well apposed. Post-stent angioplasty was performed using a BALLN SAPPHIRE [place] 3.5X15. Post-Intervention Lesion Assessment The intervention was successful. Pre-interventional TIMI flow is 1. Post-intervention TIMI flow is 3. No complications occurred at this lesion. There is a 0% residual stenosis post intervention.     ECHOCARDIOGRAM  ECHOCARDIOGRAM COMPLETE  06/15/2021  Narrative ECHOCARDIOGRAM REPORT    Patient Name:   Dawn [name] Date of Exam: 06/15/2021 Medical Rec #:  [id number]         Height:       62.0 in Accession #:    [id number]        Weight:       182.0 lb Date of Birth:  [birth year]          BSA:          1.837 m Patient Age:    75 years          BP:           136/68 mmHg Patient Gender: F                 HR:           70 bpm. Exam Location:  Inpatient  Procedure: 2D Echo, Cardiac Doppler and Color Doppler  Indications:    Chest pain  History:        Patient has no prior history of Echocardiogram examinations. Signs/Symptoms:Chest Pain and Dizziness/Lightheadedness; Risk Factors:Hypertension. Vertigo. Arm numbness.  Sonographer:    Vernell Hammersmith RDCS Referring Phys: [id number] CURTIS J WOODS  IMPRESSIONS   1. Left ventricular ejection fraction, by estimation, is 60 to 65%. The left ventricle has normal function. The left ventricle has no regional  wall motion abnormalities. There is mild left ventricular hypertrophy. Left ventricular diastolic parameters are consistent with Grade I diastolic dysfunction (impaired relaxation). 2. Right ventricular systolic function is normal. The right ventricular size is normal. Tricuspid regurgitation signal is inadequate for assessing PA pressure. 3. The mitral valve is grossly normal. Mild mitral valve regurgitation. No evidence of mitral stenosis. 4. The aortic valve is grossly normal. There is mild calcification of the aortic valve. Aortic valve regurgitation is not visualized. No aortic stenosis is present. 5. The inferior vena cava is normal in size with greater than 50% respiratory variability, suggesting right atrial pressure of 3 mmHg.  FINDINGS Left Ventricle: Left ventricular ejection fraction, by estimation, is 60 to 65%. The left ventricle has normal function. The left ventricle has no regional wall motion abnormalities. The left ventricular internal cavity size was normal in  size. There is mild left ventricular hypertrophy. Left ventricular diastolic parameters are consistent with Grade I diastolic dysfunction (impaired relaxation).  Right Ventricle: The right ventricular size is normal. No increase in right ventricular wall thickness. Right ventricular systolic function is normal. Tricuspid regurgitation signal is inadequate for assessing PA pressure.  Left Atrium: Left atrial size was normal in size.  Right Atrium: Right atrial size was normal in size.  Pericardium: There is no evidence of pericardial effusion. Presence of epicardial fat layer.  Mitral Valve: The mitral valve is grossly normal. Mild to moderate mitral annular calcification. Mild mitral valve regurgitation. No evidence of mitral valve stenosis.  Tricuspid Valve: The tricuspid valve is normal in structure. Tricuspid valve regurgitation is trivial. No evidence of tricuspid stenosis.  Aortic Valve: The aortic valve is grossly normal. There is mild calcification of the aortic valve. Aortic valve regurgitation is not visualized. No aortic stenosis is present. Aortic valve mean gradient measures 4.0 mmHg. Aortic valve peak gradient measures 6.9 mmHg. Aortic valve area, by VTI measures 2.11 cm.  Pulmonic Valve: The pulmonic valve was normal in structure. Pulmonic valve regurgitation is not visualized. No evidence of pulmonic stenosis.  Aorta: The aortic root is normal in size and structure. There is atheroma plaque involving the aortic arch and descending aorta.  Venous: The inferior vena cava is normal in size with greater than 50% respiratory variability, suggesting right atrial pressure of 3 mmHg.  IAS/Shunts: No atrial level shunt detected by color flow Doppler.   LEFT VENTRICLE PLAX 2D LVIDd:         4.60 cm     Diastology LVIDs:         3.00 cm     LV e' medial:    7.51 cm/s LV PW:         1.10 cm     LV E/e' medial:  10.5 LV IVS:        1.10 cm     LV e' lateral:   9.46 cm/s LVOT diam:      1.80 cm     LV E/e' lateral: 8.3 LV SV:         64 LV SV Index:   35 LVOT Area:     2.54 cm  LV Volumes (MOD) LV vol d, MOD A4C: 84.7 ml LV vol s, MOD A4C: 25.2 ml LV SV MOD A4C:     84.7 ml  RIGHT VENTRICLE          IVC RV Basal diam:  4.00 cm  IVC diam: 1.90 cm  LEFT ATRIUM  Index        RIGHT ATRIUM           Index LA diam:        3.70 cm 2.01 cm/m   RA Area:     11.70 cm LA Vol (A2C):   51.3 ml 27.93 ml/m  RA Volume:   22.40 ml  12.20 ml/m LA Vol (A4C):   39.5 ml 21.51 ml/m LA Biplane Vol: 48.7 ml 26.52 ml/m AORTIC VALVE AV Area (Vmax):    2.08 cm AV Area (Vmean):   2.11 cm AV Area (VTI):     2.11 cm AV Vmax:           131.00 cm/s AV Vmean:          89.800 cm/s AV VTI:            0.303 m AV Peak Grad:      6.9 mmHg AV Mean Grad:      4.0 mmHg LVOT Vmax:         107.00 cm/s LVOT Vmean:        74.400 cm/s LVOT VTI:          0.251 m LVOT/AV VTI ratio: 0.83  AORTA Ao Root diam: 3.10 cm  MITRAL VALVE MV Area (PHT): 2.76 cm     SHUNTS MV Decel Time: 275 msec     Systemic VTI:  0.25 m MV E velocity: 78.60 cm/s   Systemic Diam: 1.80 cm MV A velocity: 124.00 cm/s MV E/A ratio:  0.63  Soyla Merck MD Electronically signed by Soyla Merck MD Signature Date/Time: 06/15/2021/1:25:52 PM    Final    MONITORS  CARDIAC EVENT MONITOR 07/16/2022  Narrative No significant tachyarrhythmia or bradyarrhythmia. No atrial fibrillation or flutter.   CT SCANS  CT CORONARY FRACTIONAL FLOW RESERVE DATA PREP 07/09/2021  Narrative EXAM: CT-FFR ANALYSIS  CLINICAL DATA:  Possibly obstructive coronary lesion (LAD, RCA)  FINDINGS: CT-FFR analysis was performed on the original cardiac CT angiogram dataset. Diagrammatic representation of the CT-FFR analysis is provided in a separate PDF document in PACS. This dictation was created using the PDF document and an interactive 3D model of the results. 3D model is not available in the EMR/PACS. Normal  FFR range is >0.80.  1. Left Main: No significant functional stenosis, CT-FFR 0.99.  2. LAD: No significant functional stenosis, CT-FFR 0.96 to 0.91 at LAD lesion. 3. LCX: No significant functional stenosis, CT-FFR 0.89 distal vessel. 4. RCA: significant functional stenosis, CT-FFR 0.54.  IMPRESSION: 1. CT FFR analysis shows evidence of significant functional stenosis- RCA.  Stanly Leavens MD   Electronically Signed By: Stanly Leavens M.D. On: 07/09/2021 15:02   CT CORONARY MORPH W/CTA COR W/SCORE 07/09/2021  Addendum 07/19/2021  9:30 AM ADDENDUM REPORT: 07/19/2021 09:28  EXAM: OVER-READ INTERPRETATION  CT CHEST  The following report is an over-read performed by radiologist Dr. Marcey Diones [facility] on 07/19/2021. This over-read does not include interpretation of cardiac or coronary anatomy or pathology. The coronary CTA interpretation by the cardiologist is attached.  COMPARISON:  None.  FINDINGS: Atherosclerosis of the thoracic aorta. Visualized mediastinum and hilar regions demonstrate no lymphadenopathy or masses. There is a small hiatal hernia. Visualized lungs show no evidence of pulmonary edema, consolidation, pneumothorax, nodule or pleural fluid. Visualized upper abdomen and bony structures are unremarkable.  IMPRESSION: 1. Atherosclerosis of the thoracic aorta. 2. Small hiatal hernia.   Electronically Signed By: Marcey Moan M.D. On: 07/19/2021 09:28  Narrative CLINICAL  DATA:  78 Year old White Female  EXAM: Cardiac/Coronary  CTA  TECHNIQUE: The patient was scanned on a [employer].  FINDINGS: Scan was triggered in the descending thoracic aorta. Axial non-contrast 3 mm slices were carried out through the heart. The data set was analyzed on a dedicated work station and scored using the Agatson method. Gantry rotation speed was 250 msecs and collimation was .6 mm. 0.8 mg of sl NTG was given.  The 3D data set was reconstructed in 5% intervals of the 67-82 % of the R-R cycle. Diastolic phases were analyzed on a dedicated work station using MPR, MIP and VRT modes. The patient received 95 cc of contrast.  Aorta: Normal size. Scattered aortic calcifications. No dissection.  Main Pulmonary Artery: Normal size of the pulmonary artery.  Aortic Valve:  Tri-leaflet.  Aortic Valve calcium  score 664.  Coronary Arteries:  Normal coronary origin.  Right dominance.  Coronary Calcium  Score:  Left main: 12  Left anterior descending artery: 98  Left circumflex artery: 24  Right coronary artery: 7  Total: 141  Percentile: 71st for age, sex, and race matched control.  RCA is a large dominant artery that gives rise to PDA and PLA. There is a severe soft plaque stenosis on the proximal vessel.  Left main is a large artery that gives rise to LAD and LCX arteries. Minimal non obstructive distal calcified plaque  LAD is a large vessel that gives rise to one large D1 Branch. There is a moderate stenosis mixed plaque in the proximal vessel.  LCX is a non-dominant artery. Mild non obstructive plaque in the proximal vessel.  Other findings:  Normal variant pulmonary vein drainage into the left atrium- presence of left middle pulmonary vein.  Normal left atrial appendage without a thrombus.  Extra-cardiac findings: See attached radiology report for non-cardiac structures.  IMPRESSION: 1. Coronary calcium  score of 141. This was 71st percentile for age, sex, and race matched control.  2. Normal coronary origin with right dominance.  3. CAD-RADS 4 Severe stenosis. (70-99% or > 50% left main). Cardiac catheterization or CT FFR is recommended. Consider symptom-guided anti-ischemic pharmacotherapy as well as risk factor modification per guideline directed care.  RECOMMENDATIONS:  Coronary artery calcium  (CAC) score is a strong predictor of incident coronary heart disease (CHD)  and provides predictive information beyond traditional risk factors. CAC scoring is reasonable to use in the decision to withhold, postpone, or initiate statin therapy in intermediate-risk or selected borderline-risk asymptomatic adults (age 58-75 years and LDL-C >=70 to <190 mg/dL) who do not have diabetes or established atherosclerotic cardiovascular disease (ASCVD).* In intermediate-risk (10-year ASCVD risk >=7.5% to <20%) adults or selected borderline-risk (10-year ASCVD risk >=5% to <7.5%) adults in whom a CAC score is measured for the purpose of making a treatment decision the following recommendations have been made:  If CAC = 0, it is reasonable to withhold statin therapy and reassess in 5 to 10 years, as long as higher risk conditions are absent (diabetes mellitus, family history of premature CHD in first degree relatives (males <55 years; females <65 years), cigarette smoking, LDL >=190 mg/dL or other independent risk factors).  If CAC is 1 to 99, it is reasonable to initiate statin therapy for patients >=21 years of age.  If CAC is >=100 or >=75th percentile, it is reasonable to initiate statin therapy at any age.  Cardiology referral should be considered for patients with CAC scores =400 or >=75th percentile.  *2018 AHA/ACC/AACVPR/AAPA/ABC/ACPM/ADA/AGS/APhA/ASPC/NLA/PCNA Guideline on the  Management of Blood Cholesterol: A Report of the [employer] of Cardiology/American Heart Association Task Force on Clinical Practice Guidelines. J Am Coll Cardiol. 2019;73(24):3168-3209.  Stanly Leavens, MD  Electronically Signed: By: Stanly Leavens M.D. On: 07/09/2021 14:57     ______________________________________________________________________________________________        Risk Assessment/Calculations:     HYPERTENSION CONTROL Vitals:   06/21/24 1515 06/21/24 1547  BP: (!) 140/78 (!) 142/80    The patient's blood pressure is elevated above target  today.  In order to address the patient's elevated BP: Blood pressure will be monitored at home to determine if medication changes need to be made.          Physical Exam:   VS:  BP (!) 142/80   Pulse 69   Ht 5' 2 (1.575 m)   Wt 181 lb (82.1 kg)   SpO2 97%   BMI 33.11 kg/m    Wt Readings from Last 3 Encounters:  06/21/24 181 lb (82.1 kg)  06/14/24 183 lb (83 kg)  05/24/24 182 lb 6.4 oz (82.7 kg)    GEN: Well nourished, well developed in no acute distress NECK: No JVD; No carotid bruits CARDIAC: RRR, no murmurs, rubs, gallops RESPIRATORY:  Clear to auscultation without rales, wheezing or rhonchi  ABDOMEN: Soft, non-tender, non-distended EXTREMITIES:  No edema; No deformity   ASSESSMENT AND PLAN: .     Dyspnea / palpitations -shortness of breath with talking or ambulation ongoing as well as palpitations with sensation of thumping in her chest, right ear, right temple.  Previous propensity for bradycardia, continue carvedilol  6.25 mg twice daily. 14-day ZIO placed in clinic Plan for echo to rule out heart failure or valvular abnormality 05/27/2024 normal renal function, 05/03/2024 normal hemoglobin, 05/03/2024 normal TSH.  Will defer repeat labs at this time.  CAD s/p PCI RCA / HLD, LDL goal <70 / Statin intolerance- No chest pain. Intolerant of statin with myalgias. 01/2024 LDL 124. Tolerating Zetia  10mg  daily. Continue inclisiran.  Has completed first injection. Plan for echo, as above.  If wall motion abnormalities noted consider ischemic evaluation with Myoview.  History of CVA- Continue aspirin , Zetia  10mg  daily, Leqvio .   HTN-BP not at goal less than 130/80 in clinic however reports hypotensive readings at home and her wrist BP cuff from home was found to read high today.  Continue carvedilol  6.25 mg twice daily, hydralazine  25 mg twice daily, losartan  100 mg daily.  Did not tolerate valsartan  well.  Will call in 1 week to check in on blood pressure at home.  If persistently  greater than 130/80 plan to increase dose hydralazine  to 50 mg twice daily.    Dispo: follow up in 6-8 weeks  Signed, Reche GORMAN Finder, NP

## 2024-06-21 NOTE — Patient Instructions (Addendum)
 Medication Instructions:  Continue Losartan  100mg  daily  *If you need a refill on your cardiac medications before your next appointment, please call your pharmacy*  Testing/Procedures: Your physician has requested that you have an echocardiogram. Echocardiography is a painless test that uses sound waves to create images of your heart. It provides your doctor with information about the size and shape of your heart and how well your heart's chambers and valves are working. This procedure takes approximately one hour. There are no restrictions for this procedure. Please do NOT wear cologne, perfume, aftershave, or lotions (deodorant is allowed). Please arrive 15 minutes prior to your appointment time.  Please note: We ask at that you not bring children with you during ultrasound (echo/ vascular) testing. Due to room size and safety concerns, children are not allowed in the ultrasound rooms during exams. Our front office staff cannot provide observation of children in our lobby area while testing is being conducted. An adult accompanying a patient to their appointment will only be allowed in the ultrasound room at the discretion of the ultrasound technician under special circumstances. We apologize for any inconvenience.   Your physician has recommended that you wear a Zio monitor.   This monitor is a medical device that records the heart's electrical activity. Doctors most often use these monitors to diagnose arrhythmias. Arrhythmias are problems with the speed or rhythm of the heartbeat. The monitor is a small device applied to your chest. You can wear one while you do your normal daily activities. While wearing this monitor if you have any symptoms to push the button and record what you felt. Once you have worn this monitor for the period of time provider prescribed (Usually 14 days), you will return the monitor device in the postage paid box. Once it is returned they will download the data collected  and provide us  with a report which the provider will then review and we will call you with those results. Important tips:  Avoid showering during the first 24 hours of wearing the monitor. Avoid excessive sweating to help maximize wear time. Do not submerge the device, no hot tubs, and no swimming pools. Keep any lotions or oils away from the patch. After 24 hours you may shower with the patch on. Take brief showers with your back facing the shower head.  Do not remove patch once it has been placed because that will interrupt data and decrease adhesive wear time. Push the button when you have any symptoms and write down what you were feeling. Once you have completed wearing your monitor, remove and place into box which has postage paid and place in your outgoing mailbox.  If for some reason you have misplaced your box then call our office and we can provide another box and/or mail it off for you.   Follow-Up:   Your next appointment:   6-8 week(s)  Provider:   Rosaline Bane, NP or Reche Finder, NP      Other Instructions  WE WILL CALL YOU IN ONE WEEK TO CHECK TO OBTAIN YOUR BLOOD PRESSURE READINGS   To prevent palpitations: Make sure you are adequately hydrated.  Avoid and/or limit caffeine containing beverages like soda or tea. Exercise regularly.  Manage stress well. Some over the counter medications can cause palpitations such as Benadryl , AdvilPM, TylenolPM. Regular Advil or Tylenol  do not cause palpitations.

## 2024-06-22 ENCOUNTER — Other Ambulatory Visit: Payer: Self-pay | Admitting: Neurology

## 2024-06-22 ENCOUNTER — Telehealth (HOSPITAL_BASED_OUTPATIENT_CLINIC_OR_DEPARTMENT_OTHER): Payer: Self-pay | Admitting: Family

## 2024-06-22 NOTE — Telephone Encounter (Signed)
 Requested Prescriptions   Pending Prescriptions Disp Refills   modafinil  (PROVIGIL ) 200 MG tablet [Pharmacy Med Name: MODAFINIL  200MG  TABLETS] 180 tablet     Sig: TAKE 1 TABLET BY MOUTH EVERY AM AND 1 BY MOUTH EVERY NOON   Last seen 06/14/24 next appt 01/12/25  Dispenses   Dispensed Days Supply Quantity Provider Pharmacy  MODAFINIL  200 MG TABLET 12/10/2023 90  [name], Charlie LABOR, MD [id number]

## 2024-06-22 NOTE — Telephone Encounter (Signed)
 Left message to call back.

## 2024-06-22 NOTE — Telephone Encounter (Signed)
 Patient wants to confirm if she should still schedule with the Pharmacist as she is already getting Leqvio  shots.

## 2024-06-22 NOTE — Telephone Encounter (Signed)
 Spoke with patient who stated she was returning call but could not tell what her message said Explained to patient unable to see where anyone tried to call her  Apologized for any confusion

## 2024-06-22 NOTE — Telephone Encounter (Signed)
 Pt returning call. Please advise.

## 2024-06-24 MED ORDER — GEMTESA 75 MG PO TABS: 75.0000 mg | ORAL_TABLET | Freq: Every day | ORAL | 11 refills | Status: DC

## 2024-06-24 NOTE — Addendum Note (Signed)
 Addended by: Sukhdeep Wieting N on: 06/24/2024 01:46 PM   Modules accepted: Orders

## 2024-06-27 ENCOUNTER — Telehealth: Payer: Self-pay

## 2024-06-27 ENCOUNTER — Other Ambulatory Visit: Payer: Self-pay | Admitting: Family

## 2024-06-27 DIAGNOSIS — N3281 Overactive bladder: Secondary | ICD-10-CM

## 2024-06-27 MED ORDER — GEMTESA 75 MG PO TABS: 75.0000 mg | ORAL_TABLET | Freq: Every day | ORAL | 11 refills | Status: DC

## 2024-06-27 NOTE — Telephone Encounter (Addendum)
 Received documentation from [facility] on 06/24/24, [name], Dinah C, NP has signed and completed which has been fax and scanned in the patient chart.

## 2024-06-27 NOTE — Progress Notes (Signed)
 Paper work received for Gemtesa  to be refilled at [facility].Unable to e-scribe prescription.Prescription printed and given to B.Boomer,CMA to fax to [facility] as requested.

## 2024-06-28 ENCOUNTER — Telehealth (HOSPITAL_BASED_OUTPATIENT_CLINIC_OR_DEPARTMENT_OTHER): Payer: Self-pay | Admitting: *Deleted

## 2024-06-28 NOTE — Telephone Encounter (Signed)
 Call placed to the pt for a BP update, as advised at last OV with Reche Finder, NP.   Pt reported the following readings with dates (some before and after meds):  12/5--in AM before meds--142/75 HR-71 AND in PM was 144/70 HR-66  12/6--no readings to report  12/7--in AM after meds--107/69 HR-83 AND in PM was 140/70  12/8--in AM after meds--132/62 HR-63  Those were all the recordings the pt had to provide at this time.  She states she is still getting mildly winded with activity.  Pt is aware I will route this encounter to Reche Finder, NP for further review and advisement.  Pt is aware we will follow-up with her accordingly thereafter, with any new recommendations.   Pt verbalized understanding and agrees with this plan.

## 2024-06-28 NOTE — Telephone Encounter (Signed)
 Average SBP 131. Reasonable to continue present antihypertensive regimen. Would ask that she call us  if BP at home consistently >130/80. Her dyspnea was noted at clinic visit, await ZIO monitor and echo results for next steps.   Kalika Smay S Juaquina Machnik, NP

## 2024-06-28 NOTE — Telephone Encounter (Signed)
 Returned a call back to the pt and endorsed recommendations per Reche Finder, NP, as indicated in this message.   Pt verbalized understanding and agrees with this plan.

## 2024-07-04 ENCOUNTER — Other Ambulatory Visit: Payer: Self-pay | Admitting: Cardiovascular Disease

## 2024-07-07 ENCOUNTER — Ambulatory Visit (HOSPITAL_BASED_OUTPATIENT_CLINIC_OR_DEPARTMENT_OTHER): Admitting: Cardiovascular Disease

## 2024-07-09 ENCOUNTER — Other Ambulatory Visit: Payer: Self-pay | Admitting: Cardiovascular Disease

## 2024-07-10 ENCOUNTER — Other Ambulatory Visit: Payer: Self-pay | Admitting: Obstetrics

## 2024-07-10 DIAGNOSIS — Z87898 Personal history of other specified conditions: Secondary | ICD-10-CM

## 2024-07-11 ENCOUNTER — Ambulatory Visit: Admitting: Physical Therapy

## 2024-07-11 ENCOUNTER — Other Ambulatory Visit: Payer: Self-pay

## 2024-07-11 DIAGNOSIS — N3281 Overactive bladder: Secondary | ICD-10-CM

## 2024-07-11 MED ORDER — GEMTESA 75 MG PO TABS: 75.0000 mg | ORAL_TABLET | Freq: Every day | ORAL | 6 refills | Status: AC

## 2024-07-14 NOTE — Progress Notes (Signed)
 "  Office Visit Note  Patient: Dawn [name]             Date of Birth: Oct 03, 1945           MRN: [id number]             PCP: Leonarda Roxan BROCKS, NP Referring: Leonarda Roxan BROCKS, NP Visit Date: 07/28/2024 Occupation: Data Unavailable  Subjective:  Pain in joints  History of Present Illness: Dawn [name] is a 78 y.o. female seen for the evaluation of polyarthralgia.  Patient was initially seen in October 2021 by me.  She developed a ruptured disc in 2001 and underwent discectomy.  She started experiencing lower extremity numbness.  At the time she was referred to her neurologist and was diagnosed with MS after spinal tap.  She also was diagnosed with disc disease of lumbar spine and a spinal stenosis.  She had few epidural injections by Dr. Eldonna.  She was also followed by Dr. Anderson and had trochanteric bursa injections.  In 2021 she came with the symptoms of bilateral hand discomfort.  She also had MRI of the cervical spine at the time which showed degenerative changes.  She had labs in 2018 which were positive for ANA and SSA antibody.  X-rays obtained in 2021 were suggestive of osteoarthritis of her hands, and feet. Patient was last seen in the office on May 23, 2020.  At the time she was diagnosed with Sjogren's based on sicca symptoms, positive ANA and positive SSA antibody.  She was advised to start on pilocarpine but she declined.  She was on leflunomide  for multiple sclerosis. Right total hip replacement in June 2022 by Dr. Fidel.  She had good results from that.  She continues to have discomfort in her left hip.  She has been going to physical therapy for MS which helps her hips to some extent.  She also sees Dr. Ibezebo and had ablation on her lumbar spine which helped only to some extent.  She has had injections which are not helping so much for her back symptoms.  She has been having increased discomfort in her wrist.  She points to the [facility] region in both hands.  She also  continues to have discomfort in her hips.  None of the other joints are painful.  She denies any history of joint swelling.  She states she has numbness in her feet which causes discomfort.  She continues to have dry mouth and dry eyes symptoms.  She also has increased shortness of breath.  She believes it is related to her congestive heart failure.  She has been followed closely by cardiologist.  She reports some nocturnal cough. She is left-handed, widow, gravida 1, para 1, no history of preeclampsia or DVTs.  She does not drink any alcohol .  She quit smoking in 1985.  She smoked half to 1 pack/day for 12 years.  There is family history of rheumatoid arthritis in her sister and maternal grandmother and great maternal grandmother.   Activities of Daily Living:  Patient reports morning stiffness for all day. Patient Reports nocturnal pain.  Difficulty dressing/grooming: Denies Difficulty climbing stairs: Reports Difficulty getting out of chair: Denies Difficulty using hands for taps, buttons, cutlery, and/or writing: Reports  Review of Systems  Constitutional:  Positive for fatigue.  HENT:  Positive for mouth dryness and nose dryness. Negative for mouth sores.   Eyes:  Positive for dryness.  Respiratory:  Positive for shortness of breath.   Cardiovascular:  Positive for palpitations. Negative for chest pain.  Gastrointestinal:  Positive for constipation. Negative for blood in stool and diarrhea.  Endocrine: Negative for increased urination.  Genitourinary:  Negative for involuntary urination.  Musculoskeletal:  Positive for joint pain, gait problem, joint pain, joint swelling, myalgias, muscle weakness, morning stiffness, muscle tenderness and myalgias.  Skin:  Positive for hair loss. Negative for color change, rash and sensitivity to sunlight.  Allergic/Immunologic: Positive for susceptible to infections.  Neurological:  Positive for dizziness. Negative for headaches.  Hematological:   Negative for swollen glands.  Psychiatric/Behavioral:  Negative for depressed mood and sleep disturbance. The patient is not nervous/anxious.     PMFS History:  Patient Active Problem List   Diagnosis Date Noted   Lesion of bladder 05/13/2024   History of gross hematuria 11/02/2023   Constipation 11/02/2023   Chronic diastolic congestive heart failure (HCC) 11/02/2023   UTI (urinary tract infection) 07/09/2023   OAB (overactive bladder) 07/03/2023   Statin myopathy 06/26/2023   History of CVA (cerebrovascular accident) 06/26/2023   Rheumatoid arthritis involving right hand, unspecified whether rheumatoid factor present (HCC) 05/22/2023   Atherosclerotic heart disease of native coronary artery with other forms of angina pectoris 05/22/2023   Positive colorectal cancer screening using Cologuard test 11/14/2021   Coronary artery disease    Statin intolerance 04/19/2021   Osteoarthritis of right hip 01/17/2021   Status post THR (total hip replacement) 01/17/2021   Chronic bilateral low back pain without sciatica 10/02/2020   Chronic pain syndrome 06/20/2020   Post laminectomy syndrome 06/20/2020   Sjogren's disease 05/23/2020   Primary osteoarthritis of both hands 05/23/2020   Primary osteoarthritis of both feet 05/23/2020   Sjogren's syndrome 05/23/2020   Other secondary scoliosis, lumbar region 12/22/2019   Spondylosis without myelopathy or radiculopathy, lumbar region 12/22/2019   Cervical radiculopathy 10/18/2019   Numbness 09/21/2019   Bilateral carpal tunnel syndrome 09/21/2019   High risk medication use 09/21/2019   Memory loss 09/21/2019   Essential hypertension 08/25/2018   Abnormal SPEP 02/19/2018   Hand pain 02/20/2017   Multiple joint pain 02/18/2017   Disturbed cognition 06/25/2016   Impaired cognition 06/25/2016   Sciatica, right side 10/30/2015   Chronic fatigue 08/04/2014   Gait disturbance 08/04/2014   Unspecified visual disturbance 01/31/2013   Transient  vision disturbance 01/31/2013   Colon polyps 11/03/2011   POSTHERPETIC NEURALGIA 10/03/2009   DISPLCMT LUMBAR INTERVERT DISC W/O MYELOPATHY 09/05/2009   RENAL CALCULUS, RECURRENT 09/04/2009   Hyperlipidemia 08/31/2009   Multiple sclerosis 08/31/2009   ALLERGIC RHINITIS 08/31/2009    Past Medical History:  Diagnosis Date   Allergy    Anxiety    CAD (coronary artery disease)    s/p DES to RCA in 06/2021   Dry eyes    Endometrial polyp    Essential hypertension 08/25/2018   GERD (gastroesophageal reflux disease)    History of hiatal hernia    History of kidney stones    Hot flashes, menopausal 10/13/2011   Estradiol  started    Hyperlipidemia    Jaundice as teenager   no problems since   Memory loss 09/21/2019     1/2 of feet numb all the time   Multiple sclerosis dx 2001   Neuropathy    bilateral feet   Osteoarthritis    Sjogren's syndrome dx oct 2021   sore muscvles, dry mouth and eyes   Stroke (HCC)    Vertigo    Vision abnormalities     Family History  Problem Relation Age of Onset   Heart failure Mother    Heart failure Father    Rheum arthritis Maternal Grandmother    Arthritis Other    Hypertension Other    Heart disease Half-Brother    Rheum arthritis Sister    Rheum arthritis Maternal Great-grandmother    Colon cancer Neg Hx    Colon polyps Neg Hx    Esophageal cancer Neg Hx    Stomach cancer Neg Hx    Rectal cancer Neg Hx    Bladder Cancer Neg Hx    Uterine cancer Neg Hx    Kidney cancer Neg Hx    Past Surgical History:  Procedure Laterality Date   ABDOMINAL HYSTERECTOMY  2002   partial   COLONOSCOPY  01/27/2022   2 day prep   colonscopy  2011   CORONARY STENT INTERVENTION N/A 07/18/2021   Procedure: CORONARY STENT INTERVENTION;  Surgeon: Dann Candyce RAMAN, MD;  Location: [facility];  Service: Cardiovascular;  Laterality: N/A;   DILATATION & CURETTAGE/HYSTEROSCOPY WITH MYOSURE N/A 06/08/2020   Procedure: DILATATION &  CURETTAGE/HYSTEROSCOPY/Polypectomy WITH MYOSURE;  Surgeon: Rendell Calton LABOR, DO;  Location: [facility];  Service: Gynecology;  Laterality: N/A;   LEFT HEART CATH AND CORONARY ANGIOGRAPHY N/A 07/18/2021   Procedure: LEFT HEART CATH AND CORONARY ANGIOGRAPHY;  Surgeon: Dann Candyce RAMAN, MD;  Location: [facility];  Service: Cardiovascular;  Laterality: N/A;   LUMBAR FUSION  2001   TOTAL HIP ARTHROPLASTY Right 01/17/2021   Procedure: TOTAL HIP ARTHROPLASTY ANTERIOR APPROACH;  Surgeon: Fidel Rogue, MD;  Location: [facility];  Service: Orthopedics;  Laterality: Right;   UPPER GI ENDOSCOPY  yrs ago   Social History[1] Social History   Social History Narrative   Regular Exercise-no   Widowed   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     Tobacco use, amount per day now: former   Past tobacco use, amount per day: 1/2-1 packet   How many years did you use tobacco: 12 years   Alcohol  use (drinks per week): 0   Diet: mediterranean based   Do you drink/eat things with caffeine: yes   Marital status:        widow                          What year were you married? 1987   Do you live in a house, apartment, assisted living, condo, trailer, etc.? house   Is it one or more stories? 1 story   How many persons live in your home? 1   Do you have pets in your home?( please list) no   Current or past profession: therapist, music    Do you exercise?          yes                        Type and how often? Tai chi and pt therapy, stretches etc....   Do you have a living will? yes   Do you have a DNR form?      no  If not, do you want to discuss one? yes   Do you have signed POA/HPOA forms?      no                  If so, please bring to you appointment     Immunization History   Administered Date(s) Administered   Fluad Quad(high Dose 65+) 04/01/2019, 04/19/2021   INFLUENZA, HIGH DOSE SEASONAL PF 04/30/2017, 05/15/2018, 05/18/2020, 05/03/2024   Influenza Whole 05/14/2011   Influenza-Unspecified 03/04/2022   PFIZER(Purple Top)SARS-COV-2 Vaccination 08/27/2019, 09/21/2019, 03/07/2020, 11/16/2020   Pfizer Covid-19 Vaccine Bivalent Booster 67yrs & up 03/04/2022   Pfizer(Comirnaty)Fall Seasonal Vaccine 12 years and older 04/04/2023   Pneumococcal Conjugate-13 08/30/2018   Pneumococcal Polysaccharide-23 03/21/2009, 04/20/2020   Td (Adult), 2 Lf Tetanus Toxid, Preservative Free 12/04/2009   Tdap 11/19/2012   Zoster Recombinant(Shingrix) 04/04/2023, 08/18/2023   Zoster, Live 11/29/2009     Objective: Vital Signs: BP (!) 175/97 (BP Location: Right Arm, Patient Position: Sitting, Cuff Size: Normal)   Pulse 67   Temp (!) 97.3 F (36.3 C)   Resp 15   Ht 5' 2 (1.575 m)   Wt 183 lb 6.4 oz (83.2 kg)   BMI 33.54 kg/m    Physical Exam Vitals and nursing note reviewed.  Constitutional:      Appearance: She is well-developed.  HENT:     Head: Normocephalic and atraumatic.  Eyes:     Conjunctiva/sclera: Conjunctivae normal.  Cardiovascular:     Rate and Rhythm: Normal rate and regular rhythm.     Heart sounds: Normal heart sounds.  Pulmonary:     Effort: Pulmonary effort is normal.     Breath sounds: Normal breath sounds.  Abdominal:     General: Bowel sounds are normal.     Palpations: Abdomen is soft.  Musculoskeletal:     Cervical back: Normal range of motion.  Lymphadenopathy:     Cervical: No cervical adenopathy.  Skin:    General: Skin is warm and dry.     Capillary Refill: Capillary refill takes less than 2 seconds.  Neurological:     Mental Status: She is alert and oriented to person, place, and time.  Psychiatric:        Behavior: Behavior normal.      Musculoskeletal Exam: Patient was examined in the seated position.  Cervical spine was in  good range of motion.  She had tenderness over the lower lumbar region.  Shoulders, elbows, wrist joints with good range of motion.  She has tenderness over bilateral CMC joints, PIP and DIP joints.  No synovitis was noted.  Hip joints could not be assessed in the seated position.  However she had some limitation with abduction of her hip joints.  Knee joints in good range of motion without any warmth swelling or effusion.  There was no tenderness over ankles or MTPs.  No synovitis was noted.  CDAI Exam: CDAI Score: -- Patient Global: --; Provider Global: -- Swollen: --; Tender: -- Joint Exam 07/28/2024   No joint exam has been documented for this visit   There is currently no information documented on the homunculus. Go to the Rheumatology activity and complete the homunculus joint exam.  Investigation: No additional findings.  Imaging: LONG TERM MONITOR (3-14 DAYS) Result Date: 07/25/2024 13 Day Zio Monitor Quality: Fair.  Baseline artifact. Predominant rhythm: sinus rhythm Average heart rate: 69 bpm Max heart rate: 133 bpm in sinus rhythm Min heart rate: 51 bpm Pauses >2.5 seconds:  none Occasional (2.9%) PACs Rare (<1%) PVCs 85 episodes of SV vs atrial tachycardia.  Fastest rate 160 bpm.  Longest episode 12.6 seconds. Tiffany C. Raford, MD, [facility] 07/25/2024 7:49 AM   Recent Labs: Lab Results  Component Value Date   WBC 8.1 05/03/2024   HGB 14.1 05/03/2024   PLT 231 05/03/2024   NA 137 06/06/2024   K 4.5 06/06/2024   CL 103 06/06/2024   CO2 27 06/06/2024   GLUCOSE 81 06/06/2024   BUN 16 06/06/2024   CREATININE 0.73 06/06/2024   BILITOT 0.3 07/19/2024   ALKPHOS 132 07/19/2024   AST 18 07/19/2024   ALT 17 07/19/2024   PROT 6.0 07/19/2024   ALBUMIN 4.3 07/19/2024   CALCIUM  9.0 06/06/2024   GFRAA 105 01/25/2021   QFTBGOLD Negative 08/20/2015   In February 11, 2024 lipid panel LDL 124, triglycerides 159 May 22, 2023 vitamin D  42, TSH normal April 27, 2020 ANA 1: 80 NH, 1: 40  NS, SSA 5.3, SSB negative, dsDNA negative, Smith negative, C3-C4 normal, RF negative, anti-CCP negative  Speciality Comments: No specialty comments available.  Procedures:  No procedures performed Allergies: Repatha  Can.canada ], Crestor  [rosuvastatin ], Pravastatin , and Statins   Assessment / Plan:     Visit Diagnoses: Sjogren's syndrome with other organ involvement - Positive ANA, positive SSA, RF negative, sicca symptoms: -Patient continues to have dry mouth and dry eye symptoms.  Over-the-counter products were discussed.  Salivary gland massage was discussed.  Association of Sjogren's with ILD, lymphoma, arrhythmias and inflammatory arthritis were discussed.  She had no lymphadenopathy or parotid swelling on the examination.  She has been experiencing some shortness of breath but her lungs were clear to auscultation.  She states that shortness of breath is coming from underlying heart disease.  I will obtain labs today.  Plan: ANA, Anti-DNA antibody, double-stranded, C3 and C4, Sjogrens syndrome-A extractable nuclear antibody, Sjogrens syndrome-B extractable nuclear antibody, Rheumatoid factor, Cyclic citrul peptide antibody, IgG, Serum protein electrophoresis with reflex, IgG, IgA, IgM, Sedimentation rate, Urinalysis, Routine w reflex microscopic  Primary osteoarthritis of both hands -she continues to have pain and discomfort in her hands.  Bilateral CMC, PIP and DIP thickening was noted.  No synovitis was noted.  Joint protection muscle strengthening was discussed.  I will also give her prescription for CMC braces.  A handout on exercises was placed in the AVS.  Plan: XR Hand 2 View Right, XR Hand 2 View Left.  X-rays of bilateral hands with history of osteoarthritis.  Status post right total hip replacement-June 2022 by Dr. Fidel.  She continues to have some discomfort.  She has limited range of motion.  Primary osteoarthritis of left hip-she continues to have pain and discomfort in her left  hip which makes it difficult for her to walk.  She had limited range of motion.  Primary osteoarthritis of both feet-she had no tenderness on the examination today.  Patient states that she has paresthesias in her feet for the last few years which causes discomfort.  Spondylosis without myelopathy or radiculopathy, lumbar region - Multilevel spondylosis and spinal stenosis noted on the MRI from August 30, 2022.  She is followed by Dr. Ibazebo.  Patient states she has had ablation and also injections in her lumbar spine but has ongoing discomfort.  Other secondary scoliosis, lumbar region  Chronic pain syndrome-she continues to have pain and discomfort in multiple joints over the years.  Multiple sclerosis - dxd 2001.  She has been under care of Dr. Vear.  She was treated with leflunomide .  High risk medication use - On leflunomide  20 mg p.o. daily by Dr. Vear  Essential hypertension-blood pressure was elevated at 175/97.  Chronic diastolic congestive heart failure (HCC)-followed by cardiology.  Other medical problems are listed as follows:  History of TIA (transient ischemic attack) - 04/2022  Mixed hyperlipidemia  Polyp of colon, unspecified part of colon, unspecified type  Abnormal SPEP  RENAL CALCULUS, RECURRENT  Seasonal allergic rhinitis due to other allergic trigger  Memory loss  Family history of rheumatoid arthritis-Sister, maternal grandmother, great maternal grandmother  Orders: Orders Placed This Encounter  Procedures   XR Hand 2 View Right   XR Hand 2 View Left   ANA   Anti-DNA antibody, double-stranded   C3 and C4   Sjogrens syndrome-A extractable nuclear antibody   Sjogrens syndrome-B extractable nuclear antibody   Rheumatoid factor   Cyclic citrul peptide antibody, IgG   Serum protein electrophoresis with reflex   IgG, IgA, IgM   Sedimentation rate   Urinalysis, Routine w reflex microscopic   No orders of the defined types were placed in this  encounter.    Follow-Up Instructions: Return for Sjogren's, Osteoarthritis.   Maya Nash, MD  Note - This record has been created using Animal nutritionist.  Chart creation errors have been sought, but may not always  have been located. Such creation errors do not reflect on  the standard of medical care.     [1]  Social History Tobacco Use   Smoking status: Former    Current packs/day: 0.00    Average packs/day: 0.5 packs/day for 15.0 years (7.5 ttl pk-yrs)    Types: Cigarettes    Start date: 08/04/1968    Quit date: 08/05/1983    Years since quitting: 41.0    Passive exposure: Past   Smokeless tobacco: Never  Vaping Use   Vaping status: Never Used  Substance Use Topics   Alcohol  use: Yes    Comment: rarely, maybe yearly drink   Drug use: Never   "

## 2024-07-19 ENCOUNTER — Other Ambulatory Visit (INDEPENDENT_AMBULATORY_CARE_PROVIDER_SITE_OTHER): Payer: Self-pay

## 2024-07-19 ENCOUNTER — Other Ambulatory Visit: Payer: Self-pay

## 2024-07-19 DIAGNOSIS — Z0289 Encounter for other administrative examinations: Secondary | ICD-10-CM

## 2024-07-19 DIAGNOSIS — Z79899 Other long term (current) drug therapy: Secondary | ICD-10-CM

## 2024-07-19 DIAGNOSIS — G35C1 Active secondary progressive multiple sclerosis: Secondary | ICD-10-CM

## 2024-07-20 ENCOUNTER — Ambulatory Visit: Payer: Self-pay | Admitting: Neurology

## 2024-07-20 LAB — HEPATIC FUNCTION PANEL
ALT: 17 IU/L (ref 0–32)
AST: 18 IU/L (ref 0–40)
Albumin: 4.3 g/dL (ref 3.8–4.8)
Alkaline Phosphatase: 132 IU/L (ref 49–135)
Bilirubin Total: 0.3 mg/dL (ref 0.0–1.2)
Bilirubin, Direct: 0.12 mg/dL (ref 0.00–0.40)
Total Protein: 6 g/dL (ref 6.0–8.5)

## 2024-07-25 ENCOUNTER — Ambulatory Visit (HOSPITAL_BASED_OUTPATIENT_CLINIC_OR_DEPARTMENT_OTHER): Payer: Self-pay | Admitting: Family

## 2024-07-25 DIAGNOSIS — R002 Palpitations: Secondary | ICD-10-CM

## 2024-07-25 MED ORDER — CARVEDILOL 6.25 MG PO TABS: 9.3750 mg | ORAL_TABLET | Freq: Two times a day (BID) | ORAL | 1 refills | Status: AC

## 2024-07-25 MED ORDER — CARVEDILOL 6.25 MG PO TABS: 9.3750 mg | ORAL_TABLET | Freq: Two times a day (BID) | ORAL | 1 refills | Status: DC

## 2024-07-25 NOTE — Telephone Encounter (Signed)
-----   Message from Reche Finder, NP sent at 07/25/2024  8:46 AM EST ----- Monitor with predominantly normal sinus rhythm. Episodes of fast heart beat in the top chamber of the heart lasting at most 12.6 seconds called SVT (supraventricular tachycardia) or atrial  tachycardia. Triggered episode (when she pressed the button) was associated with normal sinus rhythm. Overall a reassuring result. Proceed with echocardiogram as scheduled.   If still experiencing bothersome palpitations, increase Carvedilol  to 1.5 tablets (9.375mg ) twice daily.  If no bothersome palpitations, follow up as scheduled. May take additional half tablet Carvedilol  (3.125mg ) as needed for palpitations.

## 2024-07-25 NOTE — Telephone Encounter (Signed)
 The patient has been notified of the result and verbalized understanding.  All questions (if any) were answered.  Pt states she would like to proceed with the increased dose of coreg  9.375 mg bid, for her palpitations can still be bothersome at times.   She is aware to keep her echo appt and follow-up appt as scheduled.   Confirmed the pharmacy of choice with the pt.   Pt verbalized understanding and agrees with this plan.

## 2024-07-25 NOTE — Addendum Note (Signed)
 Addended by: GLADIS PORTER HERO on: 07/25/2024 11:35 AM   Modules accepted: Orders

## 2024-07-28 ENCOUNTER — Ambulatory Visit

## 2024-07-28 ENCOUNTER — Ambulatory Visit: Attending: Rheumatology | Admitting: Rheumatology

## 2024-07-28 ENCOUNTER — Encounter: Payer: Self-pay | Admitting: Rheumatology

## 2024-07-28 VITALS — BP 171/82 | HR 62 | Temp 97.3°F | Resp 15 | Ht 62.0 in | Wt 183.4 lb

## 2024-07-28 DIAGNOSIS — M3509 Sicca syndrome with other organ involvement: Secondary | ICD-10-CM | POA: Diagnosis not present

## 2024-07-28 DIAGNOSIS — Z96641 Presence of right artificial hip joint: Secondary | ICD-10-CM | POA: Insufficient documentation

## 2024-07-28 DIAGNOSIS — I5032 Chronic diastolic (congestive) heart failure: Secondary | ICD-10-CM | POA: Diagnosis present

## 2024-07-28 DIAGNOSIS — Z8261 Family history of arthritis: Secondary | ICD-10-CM | POA: Diagnosis present

## 2024-07-28 DIAGNOSIS — Z79899 Other long term (current) drug therapy: Secondary | ICD-10-CM | POA: Insufficient documentation

## 2024-07-28 DIAGNOSIS — M1612 Unilateral primary osteoarthritis, left hip: Secondary | ICD-10-CM | POA: Diagnosis present

## 2024-07-28 DIAGNOSIS — M4156 Other secondary scoliosis, lumbar region: Secondary | ICD-10-CM | POA: Insufficient documentation

## 2024-07-28 DIAGNOSIS — R778 Other specified abnormalities of plasma proteins: Secondary | ICD-10-CM | POA: Insufficient documentation

## 2024-07-28 DIAGNOSIS — N200 Calculus of kidney: Secondary | ICD-10-CM | POA: Diagnosis present

## 2024-07-28 DIAGNOSIS — J3089 Other allergic rhinitis: Secondary | ICD-10-CM | POA: Diagnosis present

## 2024-07-28 DIAGNOSIS — M19042 Primary osteoarthritis, left hand: Secondary | ICD-10-CM | POA: Diagnosis not present

## 2024-07-28 DIAGNOSIS — G894 Chronic pain syndrome: Secondary | ICD-10-CM | POA: Insufficient documentation

## 2024-07-28 DIAGNOSIS — K635 Polyp of colon: Secondary | ICD-10-CM | POA: Diagnosis present

## 2024-07-28 DIAGNOSIS — M47816 Spondylosis without myelopathy or radiculopathy, lumbar region: Secondary | ICD-10-CM | POA: Insufficient documentation

## 2024-07-28 DIAGNOSIS — G35D Multiple sclerosis, unspecified: Secondary | ICD-10-CM | POA: Insufficient documentation

## 2024-07-28 DIAGNOSIS — Z8673 Personal history of transient ischemic attack (TIA), and cerebral infarction without residual deficits: Secondary | ICD-10-CM | POA: Insufficient documentation

## 2024-07-28 DIAGNOSIS — G5603 Carpal tunnel syndrome, bilateral upper limbs: Secondary | ICD-10-CM

## 2024-07-28 DIAGNOSIS — M19041 Primary osteoarthritis, right hand: Secondary | ICD-10-CM

## 2024-07-28 DIAGNOSIS — R413 Other amnesia: Secondary | ICD-10-CM | POA: Insufficient documentation

## 2024-07-28 DIAGNOSIS — M19072 Primary osteoarthritis, left ankle and foot: Secondary | ICD-10-CM | POA: Insufficient documentation

## 2024-07-28 DIAGNOSIS — I10 Essential (primary) hypertension: Secondary | ICD-10-CM | POA: Diagnosis present

## 2024-07-28 DIAGNOSIS — E782 Mixed hyperlipidemia: Secondary | ICD-10-CM | POA: Diagnosis present

## 2024-07-28 DIAGNOSIS — M19071 Primary osteoarthritis, right ankle and foot: Secondary | ICD-10-CM | POA: Insufficient documentation

## 2024-07-28 DIAGNOSIS — M5412 Radiculopathy, cervical region: Secondary | ICD-10-CM

## 2024-07-28 DIAGNOSIS — M1611 Unilateral primary osteoarthritis, right hip: Secondary | ICD-10-CM

## 2024-07-28 NOTE — Patient Instructions (Signed)
 Hand Exercises Hand exercises can be helpful for almost anyone. They can strengthen your hands and improve flexibility and movement. The exercises can also increase blood flow to the hands. These results can make your work and daily tasks easier for you. Hand exercises can be especially helpful for people who have joint pain from arthritis or nerve damage from using their hands over and over. These exercises can also help people who injure a hand. Exercises Most of these hand exercises are gentle stretching and motion exercises. It is usually safe to do them often throughout the day. Warming up your hands before exercise may help reduce stiffness. You can do this with gentle massage or by placing your hands in warm water for 10-15 minutes. It is normal to feel some stretching, pulling, tightness, or mild discomfort when you begin new exercises. In time, this will improve. Remember to always be careful and stop right away if you feel sudden, very bad pain or your pain gets worse. You want to get better and be safe. Ask your health care provider which exercises are safe for you. Do exercises exactly as told by your provider and adjust them as told. Do not begin these exercises until told by your provider. Knuckle bend or "claw" fist  Stand or sit with your arm, hand, and all five fingers pointed straight up. Make sure to keep your wrist straight. Gently bend your fingers down toward your palm until the tips of your fingers are touching your palm. Keep your big knuckle straight and only bend the small knuckles in your fingers. Hold this position for 10 seconds. Straighten your fingers back to your starting position. Repeat this exercise 5-10 times with each hand. Full finger fist  Stand or sit with your arm, hand, and all five fingers pointed straight up. Make sure to keep your wrist straight. Gently bend your fingers into your palm until the tips of your fingers are touching the middle of your  palm. Hold this position for 10 seconds. Extend your fingers back to your starting position, stretching every joint fully. Repeat this exercise 5-10 times with each hand. Straight fist  Stand or sit with your arm, hand, and all five fingers pointed straight up. Make sure to keep your wrist straight. Gently bend your fingers at the big knuckle, where your fingers meet your hand, and at the middle knuckle. Keep the knuckle at the tips of your fingers straight and try to touch the bottom of your palm. Hold this position for 10 seconds. Extend your fingers back to your starting position, stretching every joint fully. Repeat this exercise 5-10 times with each hand. Tabletop  Stand or sit with your arm, hand, and all five fingers pointed straight up. Make sure to keep your wrist straight. Gently bend your fingers at the big knuckle, where your fingers meet your hand, as far down as you can. Keep the small knuckles in your fingers straight. Think of forming a tabletop with your fingers. Hold this position for 10 seconds. Extend your fingers back to your starting position, stretching every joint fully. Repeat this exercise 5-10 times with each hand. Finger spread  Place your hand flat on a table with your palm facing down. Make sure your wrist stays straight. Spread your fingers and thumb apart from each other as far as you can until you feel a gentle stretch. Hold this position for 10 seconds. Bring your fingers and thumb tight together again. Hold this position for 10 seconds. Repeat  this exercise 5-10 times with each hand. Making circles  Stand or sit with your arm, hand, and all five fingers pointed straight up. Make sure to keep your wrist straight. Make a circle by touching the tip of your thumb to the tip of your index finger. Hold for 10 seconds. Then open your hand wide. Repeat this motion with your thumb and each of your fingers. Repeat this exercise 5-10 times with each hand. Thumb  motion  Sit with your forearm resting on a table and your wrist straight. Your thumb should be facing up toward the ceiling. Keep your fingers relaxed as you move your thumb. Lift your thumb up as high as you can toward the ceiling. Hold for 10 seconds. Bend your thumb across your palm as far as you can, reaching the tip of your thumb for the small finger (pinkie) side of your palm. Hold for 10 seconds. Repeat this exercise 5-10 times with each hand. Grip strengthening  Hold a stress ball or other soft ball in the middle of your hand. Slowly increase the pressure, squeezing the ball as much as you can without causing pain. Think of bringing the tips of your fingers into the middle of your palm. All of your finger joints should bend when doing this exercise. Hold your squeeze for 10 seconds, then relax. Repeat this exercise 5-10 times with each hand. Contact a health care provider if: Your hand pain or discomfort gets much worse when you do an exercise. Your hand pain or discomfort does not improve within 2 hours after you exercise. If you have either of these problems, stop doing these exercises right away. Do not do them again unless your provider says that you can. Get help right away if: You develop sudden, severe hand pain or swelling. If this happens, stop doing these exercises right away. Do not do them again unless your provider says that you can. This information is not intended to replace advice given to you by your health care provider. Make sure you discuss any questions you have with your health care provider. Document Revised: 07/22/2022 Document Reviewed: 07/22/2022 Elsevier Patient Education  2024 [employer].

## 2024-07-29 ENCOUNTER — Telehealth: Payer: Self-pay

## 2024-07-29 NOTE — Telephone Encounter (Signed)
 Auth Submission: NO AUTH NEEDED Site of care: Site of care: [facility] Payer: Medicare A/B with [employer] supplement Medication & CPT/J Code(s) submitted: Leqvio  (Inclisiran) J1306 Diagnosis Code:  Route of submission (phone, fax, portal):  Phone # Fax # Auth type: Buy/Bill PB Units/visits requested: 284mg  x 2 doses Reference number:  Approval from: 07/29/24 to 08/20/25

## 2024-07-31 ENCOUNTER — Ambulatory Visit: Payer: Self-pay | Admitting: Rheumatology

## 2024-07-31 NOTE — Progress Notes (Signed)
 ANA positive, double-stranded DNA negative, Sjogren's antibody SSA positive, SSB negative, complements normal, RF negative, anti-CCP negative, UA negative, sed rate normal, IgG and IgM are low, IFE pending.  I will discuss results at the follow-up visit.

## 2024-08-03 ENCOUNTER — Telehealth: Payer: Self-pay

## 2024-08-03 ENCOUNTER — Other Ambulatory Visit (INDEPENDENT_AMBULATORY_CARE_PROVIDER_SITE_OTHER)

## 2024-08-03 DIAGNOSIS — R0602 Shortness of breath: Secondary | ICD-10-CM | POA: Diagnosis not present

## 2024-08-03 DIAGNOSIS — R42 Dizziness and giddiness: Secondary | ICD-10-CM

## 2024-08-03 LAB — ANA: Anti Nuclear Antibody (ANA): POSITIVE — AB

## 2024-08-03 LAB — CYCLIC CITRUL PEPTIDE ANTIBODY, IGG: Cyclic Citrullin Peptide Ab: <16 U

## 2024-08-03 LAB — URINALYSIS, ROUTINE W REFLEX MICROSCOPIC
Bilirubin Urine: NEGATIVE
Glucose, UA: NEGATIVE
Hgb urine dipstick: NEGATIVE
Ketones, ur: NEGATIVE
Leukocytes,Ua: NEGATIVE
Nitrite: NEGATIVE
Protein, ur: NEGATIVE
Specific Gravity, Urine: 1.019 (ref 1.001–1.035)
pH: 6 (ref 5.0–8.0)

## 2024-08-03 LAB — ECHOCARDIOGRAM COMPLETE
Area-P 1/2: 4.06 cm2
MV M vel: 4.17 m/s
MV Peak grad: 69.6 mmHg
S' Lateral: 2.8 cm

## 2024-08-03 LAB — IGG, IGA, IGM
IgG (Immunoglobin G), Serum: 560 mg/dL — ABNORMAL LOW (ref 600–1540)
IgM, Serum: 34 mg/dL — ABNORMAL LOW (ref 50–300)
Immunoglobulin A: 96 mg/dL (ref 70–320)

## 2024-08-03 LAB — ANTI-NUCLEAR AB-TITER (ANA TITER)
ANA TITER: 1:40 {titer} — ABNORMAL HIGH
ANA Titer 1: 1:80 {titer} — ABNORMAL HIGH

## 2024-08-03 LAB — PROTEIN ELECTROPHORESIS, SERUM, WITH REFLEX
Albumin ELP: 4.1 g/dL (ref 3.8–4.8)
Alpha 1: 0.3 g/dL (ref 0.2–0.3)
Alpha 2: 0.7 g/dL (ref 0.5–0.9)
Beta 2: 0.3 g/dL (ref 0.2–0.5)
Beta Globulin: 0.5 g/dL (ref 0.4–0.6)
Gamma Globulin: 0.5 g/dL — ABNORMAL LOW (ref 0.8–1.7)
Total Protein: 6.5 g/dL (ref 6.1–8.1)

## 2024-08-03 LAB — RHEUMATOID FACTOR: Rheumatoid fact SerPl-aCnc: <10 [IU]/mL

## 2024-08-03 LAB — IFE INTERPRETATION

## 2024-08-03 LAB — ANTI-DNA ANTIBODY, DOUBLE-STRANDED: ds DNA Ab: <1 [IU]/mL

## 2024-08-03 LAB — SEDIMENTATION RATE: Sed Rate: 9 mm/h (ref 0–30)

## 2024-08-03 LAB — C3 AND C4
C3 Complement: 166 mg/dL (ref 83–193)
C4 Complement: 24 mg/dL (ref 15–57)

## 2024-08-03 LAB — SJOGRENS SYNDROME-A EXTRACTABLE NUCLEAR ANTIBODY: SSA (Ro) (ENA) Antibody, IgG: 1.7 AI — AB

## 2024-08-03 LAB — SJOGRENS SYNDROME-B EXTRACTABLE NUCLEAR ANTIBODY: SSB (La) (ENA) Antibody, IgG: <1 AI

## 2024-08-03 NOTE — Telephone Encounter (Signed)
 We will have detailed discussion at the follow-up visit.

## 2024-08-03 NOTE — Progress Notes (Signed)
 IFE normal.

## 2024-08-03 NOTE — Telephone Encounter (Signed)
 Contacted patient to advise about labs. Patient verbalized understanding. Patient stated she would like to know if there is any recommendations to manage her OA and Sjogren's.  Please review and advise

## 2024-08-11 ENCOUNTER — Encounter: Payer: Self-pay | Admitting: Family

## 2024-08-11 ENCOUNTER — Telehealth: Payer: Self-pay

## 2024-08-11 ENCOUNTER — Ambulatory Visit: Admitting: Family

## 2024-08-11 VITALS — BP 150/80 | HR 95 | Temp 97.6°F | Resp 19 | Ht 62.0 in | Wt 188.4 lb

## 2024-08-11 DIAGNOSIS — R300 Dysuria: Secondary | ICD-10-CM

## 2024-08-11 LAB — POCT URINALYSIS DIPSTICK

## 2024-08-11 MED ORDER — NITROFURANTOIN MACROCRYSTAL 100 MG PO CAPS: 100.0000 mg | ORAL_CAPSULE | Freq: Two times a day (BID) | ORAL | 0 refills | Status: AC

## 2024-08-11 NOTE — Patient Instructions (Signed)
 1.Report to local pharmacy to receive Covid & Tdap Vaccines. 2.Stop at check out & schedule Annual Wellness Visit.

## 2024-08-11 NOTE — Telephone Encounter (Signed)
 Copied from CRM #[id number]. Topic: Clinical - Medication Question >> Aug 11, 2024  8:49 AM Dawn [name] wrote: Reason for CRM: Patient is calling because she thinks she has a UTI and wants a rx sent into the pharmacy. Could you assist? Could you contact the patient back at [phone].   [id number] [address], [place] - [address] AT [facility] [address] [place] [address] Phone: [phone] Fax: [phone] Hours: Not open 24 hours

## 2024-08-11 NOTE — Telephone Encounter (Signed)
 Spoke with patient stated home test for UTI and the test came back purple and want to asked to [name], Dawn C, NP to send something for UTI. Patient also stated that she is having burning when she uses the bathroom. I did advise the patient in order to send a prescription you will need to seen in the office. Patient agreed and has appointment this morning in the office at 10 am.  Message sent to [name], Dawn BROCKS, NP and Lelon [name], CMA

## 2024-08-13 LAB — URINE CULTURE
MICRO NUMBER:: 17504126
SPECIMEN QUALITY:: ADEQUATE

## 2024-08-15 NOTE — Progress Notes (Signed)
 "  Provider: Javarus Dorner FNP-C  Shatori Bertucci, Roxan BROCKS, NP  Patient Care Team: Cree Kunert, Roxan BROCKS, NP as PCP - General (Family Medicine) Alvan Ronal BRAVO, MD (Inactive) as PCP - Cardiology (Cardiology) Anderson Maude ORN, MD (Inactive) as Consulting Physician (Orthopedic Surgery) [name], Charlie LABOR, MD (Neurology) Carlus, Jordan, OD (Optometry) Stuart Norris, NP as Nurse Practitioner (Obstetrics and Gynecology) Livingston Rigg, MD as Consulting Physician (Dermatology) Margeret Kotyk, MD as Referring Physician (Physical Medicine and Rehabilitation)  Extended Emergency Contact Information Primary Emergency Contact: Walters,Ron  [place] Mobile Phone: [phone] Relation: Friend Secondary Emergency Contact: Cranford,Jane Mobile Phone: [phone] Relation: Friend  Code Status: Full Code  Goals of care: Advanced Directive information    08/11/2024   10:03 AM  Advanced Directives  Does Patient Have a Medical Advance Directive? Yes  Type of Advance Directive Living will  Does patient want to make changes to medical advance directive? No - Patient declined     Chief Complaint  Patient presents with   UTI Symptoms    History of Present Illness   Dawn [name] is a 79 year old female who presents with symptoms of a urinary tract infection.  Symptoms began suddenly on Sunday, characterized by dysuria, urinary frequency with variable volume, and achy abdominal pain. No associated nausea, vomiting, fever, chills, or constipation. She has been taking pyridium three times a day since Sunday night, resulting in orange urine.  She has a history of urinary tract infections, which have been less frequent since starting Gemtesa  and an estrogen supplement. Despite this, she was surprised by the onset of her current symptoms. She has been increasing her water  intake to manage symptoms.  She reports that her blood pressure tends to be higher in the mornings when she takes all  her medications at once before going out, but is usually lower at home when she spreads out her medication intake, particularly modafinil  for fatigue. At home, she usually spreads out her medication intake, particularly modafinil  for fatigue, resulting in lower blood pressure readings.  She has a history of shortness of breath, for which she underwent an echocardiogram and wore a monitor over Christmas. Her carvedilol  dosage was increased, and she believes it is helping. No headaches or dizziness.  She has allergies to Repatha , Crestor , pravastatin , all statins, and amoxicillin . She reports a past adverse reaction to Cipro , which caused gastrointestinal upset, including diarrhea.  [place] and is concerned about snow affecting her ability to access medication. She uses soy milk for protein and enjoys Greek yogurt, which she sometimes makes into cheese.   Past Medical History:  Diagnosis Date   Allergy    Anxiety    CAD (coronary artery disease)    s/p DES to RCA in 06/2021   Dry eyes    Endometrial polyp    Essential hypertension 08/25/2018   GERD (gastroesophageal reflux disease)    History of hiatal hernia    History of kidney stones    Hot flashes, menopausal 10/13/2011   Estradiol  started    Hyperlipidemia    Jaundice as teenager   no problems since   Memory loss 09/21/2019     1/2 of feet numb all the time   Multiple sclerosis dx 2001   Neuropathy    bilateral feet   Osteoarthritis    Sjogren's syndrome dx oct 2021   sore muscvles, dry mouth and eyes   Stroke [facility])    Vertigo    Vision abnormalities  Past Surgical History:  Procedure Laterality Date   ABDOMINAL HYSTERECTOMY  2002   partial   COLONOSCOPY  01/27/2022   2 day prep   colonscopy  2011   CORONARY STENT INTERVENTION N/A 07/18/2021   Procedure: CORONARY STENT INTERVENTION;  Surgeon: Dann Candyce RAMAN, MD;  Location: [facility] INVASIVE CV LAB;  Service: Cardiovascular;  Laterality:  N/A;   DILATATION & CURETTAGE/HYSTEROSCOPY WITH MYOSURE N/A 06/08/2020   Procedure: DILATATION & CURETTAGE/HYSTEROSCOPY/Polypectomy WITH MYOSURE;  Surgeon: Rendell Calton LABOR, DO;  Location: [facility];  Service: Gynecology;  Laterality: N/A;   LEFT HEART CATH AND CORONARY ANGIOGRAPHY N/A 07/18/2021   Procedure: LEFT HEART CATH AND CORONARY ANGIOGRAPHY;  Surgeon: Dann Candyce RAMAN, MD;  Location: [facility] INVASIVE CV LAB;  Service: Cardiovascular;  Laterality: N/A;   LUMBAR FUSION  2001   TOTAL HIP ARTHROPLASTY Right 01/17/2021   Procedure: TOTAL HIP ARTHROPLASTY ANTERIOR APPROACH;  Surgeon: Fidel Rogue, MD;  Location: [facility] ORS;  Service: Orthopedics;  Laterality: Right;   UPPER GI ENDOSCOPY  yrs ago    Allergies[1]  Outpatient Encounter Medications as of 08/11/2024  Medication Sig   acetaminophen  (TYLENOL ) 500 MG tablet Take 1,000 mg by mouth every 6 (six) hours as needed for mild pain or moderate pain.   aspirin  EC 81 MG tablet Take 81 mg by mouth daily. Swallow whole.   carvedilol  (COREG ) 6.25 MG tablet Take 1.5 tablets (9.375 mg total) by mouth 2 (two) times daily.   cholecalciferol (VITAMIN D ) 25 MCG (1000 UNIT) tablet Take 2,000 Units by mouth daily.   clonazePAM  (KLONOPIN ) 0.5 MG tablet TAKE 1 TABLET(0.5 MG) BY MOUTH AT BEDTIME   estradiol  (ESTRACE ) 0.01 % CREA vaginal cream PLACE 0.5-1 GIVE NIGHTLY FOR 2 WEEKS THEN TWICE A WEEK AFTER   ezetimibe  (ZETIA ) 10 MG tablet TAKE 1 TABLET(10 MG) BY MOUTH DAILY   fexofenadine (ALLEGRA) 180 MG tablet Take 180 mg by mouth at bedtime.    Flaxseed, Linseed, (FLAXSEED OIL PO) Take 5-10 mLs by mouth 3 (three) times a week. Power   fluticasone (FLONASE) 50 MCG/ACT nasal spray Place 1 spray into both nostrils daily as needed for allergies or rhinitis.   hydrALAZINE  (APRESOLINE ) 25 MG tablet TAKE 1 TABLET(25 MG) BY MOUTH IN THE MORNING AND AT BEDTIME   inclisiran (LEQVIO ) 284 MG/1.5ML SOSY injection Inject 284 mg into the skin every 6  (six) months.   leflunomide  (ARAVA ) 20 MG tablet Take 1 tablet (20 mg total) by mouth daily.   Lidocaine  4 % PTCH Place 1 patch onto the skin daily as needed (mild pain). Remove & Discard patch within 12 hours or as directed by MD   losartan  (COZAAR ) 100 MG tablet Take 1 tablet (100 mg total) by mouth daily.   melatonin 5 MG TABS Take 5 mg by mouth at bedtime as needed (Sleep).   modafinil  (PROVIGIL ) 200 MG tablet TAKE 1 TABLET BY MOUTH EVERY AM AND 1 BY MOUTH EVERY NOON   nitrofurantoin  (MACRODANTIN ) 100 MG capsule Take 1 capsule (100 mg total) by mouth 2 (two) times daily for 7 days.   nitroGLYCERIN  (NITROSTAT ) 0.4 MG SL tablet Place 1 tablet (0.4 mg total) under the tongue every 5 (five) minutes as needed for chest pain.   Omega-3 Fatty Acids (OMEGA 3 500 PO) Take 500 mg by mouth daily.   pantoprazole  (PROTONIX ) 40 MG tablet TAKE 1 TABLET(40 MG) BY MOUTH TWICE DAILY   tiZANidine (ZANAFLEX) 4 MG tablet Take 2 mg by mouth 3 (three) times daily  as needed.   Vibegron  (GEMTESA ) 75 MG TABS Take 1 tablet (75 mg total) by mouth daily.   No facility-administered encounter medications on file as of 08/11/2024.    Review of Systems  Constitutional:  Negative for appetite change, chills, fatigue, fever and unexpected weight change.  HENT:  Negative for congestion, dental problem, ear discharge, ear pain, hearing loss, nosebleeds, postnasal drip, rhinorrhea, sinus pressure, sinus pain, sneezing, sore throat and tinnitus.   Eyes:  Negative for pain, discharge, redness, itching and visual disturbance.  Respiratory:  Negative for cough, chest tightness, shortness of breath and wheezing.   Cardiovascular:  Negative for chest pain, palpitations and leg swelling.  Gastrointestinal:  Negative for abdominal distention, abdominal pain, constipation, diarrhea, nausea and vomiting.  Endocrine: Negative for polydipsia, polyphagia and polyuria.  Genitourinary:  Positive for dysuria and frequency. Negative for  difficulty urinating, flank pain and urgency.  Musculoskeletal:  Negative for arthralgias, back pain, gait problem and joint swelling.  Skin:  Negative for color change, pallor and rash.  Neurological:  Negative for dizziness, weakness, light-headedness, numbness and headaches.  Hematological:  Does not bruise/bleed easily.  Psychiatric/Behavioral:  Negative for agitation, behavioral problems, confusion, hallucinations and sleep disturbance. The patient is not nervous/anxious.     Immunization History  Administered Date(s) Administered   Fluad Quad(high Dose 65+) 04/01/2019, 04/19/2021   INFLUENZA, HIGH DOSE SEASONAL PF 04/30/2017, 05/15/2018, 05/18/2020, 05/03/2024   Influenza Whole 05/14/2011   Influenza-Unspecified 03/04/2022   PFIZER(Purple Top)SARS-COV-2 Vaccination 08/27/2019, 09/21/2019, 03/07/2020, 11/16/2020   Pfizer Covid-19 Vaccine Bivalent Booster 24yrs & up 03/04/2022   Pfizer(Comirnaty)Fall Seasonal Vaccine 12 years and older 04/04/2023   Pneumococcal Conjugate-13 08/30/2018   Pneumococcal Polysaccharide-23 03/21/2009, 04/20/2020   Td (Adult), 2 Lf Tetanus Toxid, Preservative Free 12/04/2009   Tdap 11/19/2012   Zoster Recombinant(Shingrix) 04/04/2023, 08/18/2023   Zoster, Live 11/29/2009   Pertinent  Health Maintenance Due  Topic Date Due   Bone Density Scan  11/01/2024 (Originally 12/20/2012)   Influenza Vaccine  Completed   Mammogram  Discontinued      07/03/2023    1:17 PM 09/28/2023   10:25 AM 11/02/2023   10:31 AM 03/09/2024    2:43 PM 08/11/2024   10:02 AM  Fall Risk  Falls in the past year? 1 0 0 0 0  Was there an injury with Fall? 1  0  0  0  0  Fall Risk Category Calculator 2 0 0 0 0  Patient at Risk for Falls Due to No Fall Risks No Fall Risks No Fall Risks No Fall Risks No Fall Risks  Fall risk Follow up Falls evaluation completed Falls evaluation completed;Education provided;Falls prevention discussed Falls evaluation completed Falls evaluation completed  Falls evaluation completed     Data saved with a previous flowsheet row definition   Functional Status Survey:    Vitals:   08/11/24 1005 08/11/24 1029  BP: (!) 142/98 (!) 150/80  Pulse: 95   Resp: 19   Temp: 97.6 F (36.4 C)   SpO2: 99%   Weight: 188 lb 6.4 oz (85.5 kg)   Height: 5' 2 (1.575 m)    Body mass index is 34.46 kg/m. Physical Exam  VITALS: T- 97.6, P- 80, BP- 150/80, SaO2- 99% MEASUREMENTS: Weight- 188. GENERAL: Alert, cooperative, well developed, no acute distress. HEENT: Normocephalic, normal oropharynx, moist mucous membranes. CHEST: Clear to auscultation bilaterally, no wheezes, rhonchi, or crackles. CARDIOVASCULAR: Normal heart rate and rhythm, S1 and S2 normal without murmurs. ABDOMEN: Soft, tender, non-distended, without  organomegaly, normal bowel sounds. EXTREMITIES: No cyanosis, edema, or leg swelling. NEUROLOGICAL: Cranial nerves grossly intact, moves all extremities without gross motor or sensory deficit.   Labs reviewed: Recent Labs    05/03/24 1059 06/02/24 1313 06/06/24 0939  NA 134* 139 137  K 4.3 4.8 4.5  CL 99 101 103  CO2 27 24 27   GLUCOSE 84 82 81  BUN 16 17 16   CREATININE 0.76 0.74 0.73  CALCIUM  9.7 9.5 9.0   Recent Labs    08/19/23 1002 08/19/23 1002 11/11/23 0953 05/03/24 1059 07/19/24 1134 07/28/24 0934  AST 19  --  17 16 18   --   ALT 16  --  14 15 17   --   ALKPHOS 129*  --   --   --  132  --   BILITOT 0.4  --  0.3 0.4 0.3  --   PROT 6.0   < > 5.8* 6.2 6.0 6.5  ALBUMIN 3.9  --   --   --  4.3  --    < > = values in this interval not displayed.   Recent Labs    11/11/23 0953 05/03/24 1059  WBC 3.3* 8.1  NEUTROABS 2,142 6,156  HGB 13.6 14.1  HCT 41.3 42.2  MCV 89.8 91.3  PLT 224 231   Lab Results  Component Value Date   TSH 3.18 05/03/2024   Lab Results  Component Value Date   HGBA1C 5.4 06/15/2021   Lab Results  Component Value Date   CHOL 199 02/11/2024   HDL 47 02/11/2024   LDLCALC 124 (H)  02/11/2024   LDLDIRECT 130.2 11/03/2011   TRIG 159 (H) 02/11/2024   CHOLHDL 4.2 02/11/2024    Significant Diagnostic Results in last 30 days:  ECHOCARDIOGRAM COMPLETE Result Date: 08/03/2024    ECHOCARDIOGRAM REPORT   Patient Name:   MAHASIN RIVIERE Rueda Date of Exam: 08/03/2024 Medical Rec #:  [id number]         Height:       62.2 in Accession #:    [id number]        Weight:       183.4 lb Date of Birth:  Jul 30, 1945          BSA:          1.847 m Patient Age:    78 years          BP:           128/70 mmHg Patient Gender: F                 HR:           60 bpm. Exam Location:  Outpatient Procedure: 2D Echo, 3D Echo, Strain Analysis, Color Doppler and Cardiac Doppler            (Both Spectral and Color Flow Doppler were utilized during            procedure). Indications:    Dyspnea  History:        Patient has prior history of Echocardiogram examinations, most                 recent 06/15/2021. CAD, Stroke; Risk Factors:Hypertension,                 Former Smoker and Dyslipidemia.  Sonographer:    Orvil Holmes RDCS Referring Phys: [id number] CAITLIN S WALKER IMPRESSIONS  1. Left ventricular ejection fraction, by estimation, is 55 to 60%. Left ventricular ejection fraction by  3D volume is 55 %. The left ventricle has normal function. The left ventricle has no regional wall motion abnormalities. Left ventricular diastolic  function could not be evaluated. The average left ventricular global longitudinal strain is -17.8 %. The global longitudinal strain is normal.  2. Right ventricular systolic function is normal. The right ventricular size is normal.  3. The mitral valve is normal in structure. No evidence of mitral valve regurgitation. No evidence of mitral stenosis. Moderate mitral annular calcification.  4. The aortic valve is normal in structure. Aortic valve regurgitation is trivial. Aortic valve sclerosis/calcification is present, without any evidence of aortic stenosis.  5. The inferior vena cava is normal in  size with greater than 50% respiratory variability, suggesting right atrial pressure of 3 mmHg. Comparison(s): No significant change from prior study. Prior images reviewed side by side. FINDINGS  Left Ventricle: Left ventricular ejection fraction, by estimation, is 55 to 60%. Left ventricular ejection fraction by 3D volume is 55 %. The left ventricle has normal function. The left ventricle has no regional wall motion abnormalities. The average left ventricular global longitudinal strain is -17.8 %. Strain was performed and the global longitudinal strain is normal. The left ventricular internal cavity size was normal in size. There is no left ventricular hypertrophy. Left ventricular diastolic function could not be evaluated due to mitral annular calcification (moderate or greater). Left ventricular diastolic function could not be evaluated. Right Ventricle: The right ventricular size is normal. No increase in right ventricular wall thickness. Right ventricular systolic function is normal. Left Atrium: Left atrial size was normal in size. Right Atrium: Right atrial size was normal in size. Pericardium: There is no evidence of pericardial effusion. Mitral Valve: The mitral valve is normal in structure. Moderate mitral annular calcification. No evidence of mitral valve regurgitation. No evidence of mitral valve stenosis. Tricuspid Valve: The tricuspid valve is normal in structure. Tricuspid valve regurgitation is not demonstrated. No evidence of tricuspid stenosis. Aortic Valve: The aortic valve is normal in structure. Aortic valve regurgitation is trivial. Aortic valve sclerosis/calcification is present, without any evidence of aortic stenosis. Pulmonic Valve: The pulmonic valve was not well visualized. Pulmonic valve regurgitation is not visualized. No evidence of pulmonic stenosis. Aorta: The aortic root is normal in size and structure. Venous: The inferior vena cava is normal in size with greater than 50%  respiratory variability, suggesting right atrial pressure of 3 mmHg. IAS/Shunts: No atrial level shunt detected by color flow Doppler. Additional Comments: 3D was performed not requiring image post processing on an independent workstation and was normal.  LEFT VENTRICLE PLAX 2D LVIDd:         4.63 cm         Diastology LVIDs:         2.80 cm         LV e' medial:    6.96 cm/s LV PW:         1.21 cm         LV E/e' medial:  13.8 LV IVS:        0.98 cm         LV e' lateral:   9.25 cm/s LVOT diam:     1.90 cm         LV E/e' lateral: 10.4 LV SV:         56 LV SV Index:   30              2D Longitudinal LVOT Area:  2.84 cm        Strain                                2D Strain GLS   -18.2 %                                (A4C):                                2D Strain GLS   -16.6 %                                (A3C):                                2D Strain GLS   -18.6 %                                (A2C):                                2D Strain GLS   -17.8 %                                Avg:                                 3D Volume EF                                LV 3D EF:    Left                                             ventricul                                             ar                                             ejection                                             fraction                                             by 3D  volume is                                             55 %.                                 3D Volume EF:                                3D EF:        55 %                                LV EDV:       78 ml                                LV ESV:       35 ml                                LV SV:        43 ml RIGHT VENTRICLE RV Basal diam:  2.88 cm RV Mid diam:    2.16 cm RV S prime:     13.10 cm/s TAPSE (M-mode): 2.4 cm LEFT ATRIUM             Index        RIGHT ATRIUM           Index LA diam:        4.30 cm 2.33 cm/m   RA Area:      11.10 cm LA Vol (A2C):   35.9 ml 19.44 ml/m  RA Volume:   23.80 ml  12.89 ml/m LA Vol (A4C):   61.6 ml 33.35 ml/m LA Biplane Vol: 50.6 ml 27.40 ml/m  AORTIC VALVE LVOT Vmax:   84.90 cm/s LVOT Vmean:  56.600 cm/s LVOT VTI:    0.198 m  AORTA Ao Root diam: 3.10 cm Ao Asc diam:  3.50 cm MITRAL VALVE MV Area (PHT): 4.06 cm    SHUNTS MV Decel Time: 187 msec    Systemic VTI:  0.20 m MR Peak grad: 69.6 mmHg    Systemic Diam: 1.90 cm MR Vmax:      417.00 cm/s MV E velocity: 95.90 cm/s MV A velocity: 69.70 cm/s MV E/A ratio:  1.38 Franck Azobou Tonleu Electronically signed by Joelle Cedars Tonleu Signature Date/Time: 08/03/2024/2:27:53 PM    Final    XR Hand 2 View Left Result Date: 07/28/2024 Severe CMC narrowing and subluxation was noted.  PIP and DIP narrowing was noted.  No MCP, intercarpal or or radiocarpal joint space narrowing was noted.  Generalized osteopenia was noted. Impression: These findings are suggestive of osteoarthritis of the hand.  XR Hand 2 View Right Result Date: 07/28/2024 Severe CMC narrowing and subluxation was noted.  PIP and DIP narrowing was noted.  No MCP, intercarpal or or radiocarpal joint space narrowing was noted.  Generalized osteopenia was noted. Impression: These findings are suggestive of osteoarthritis of the hand.  LONG TERM MONITOR (3-14 DAYS) Result Date: 07/25/2024 13 Day Zio Monitor Quality: Fair.  Baseline artifact. Predominant rhythm: sinus rhythm Average heart rate: 69 bpm Max heart rate: 133 bpm in sinus rhythm Min heart rate: 51 bpm Pauses >2.5 seconds: none Occasional (2.9%) PACs Rare (<1%) PVCs 85 episodes of SV vs atrial tachycardia.  Fastest rate 160 bpm.  Longest episode 12.6 seconds. Tiffany C. Raford, MD, [facility] 07/25/2024 7:49 AM   Assessment/Plan  Acute urinary tract infection Symptoms of dysuria, frequency, and suprapubic tenderness. No fever, chills, or nausea. Urine is orange due to pyridium. Previous UTIs managed with Gemtesa  and estrogen. Concerns  about accessing medication due to potential snowstorm. - Sent urine for culture - Prescribed nitrofurantoin  100 mg, one tablet in the morning and one in the evening for 7 days - Advised to increase fluid intake - Discussed potential side effects of antibiotics and recommended yogurt or probiotics to prevent diarrhea - Instructed to contact if unable to obtain medication from preferred pharmacy  Hypertension Blood pressure elevated at 150/80 mmHg in office, typically higher in the morning. No symptoms of headache or dizziness. Blood pressure management includes medication timing adjustments. - Advised to monitor blood pressure at home and report if consistently high - Encouraged spreading out medication intake to manage morning spikes   Family/ staff Communication: Reviewed plan of care with patient verbalized understanding   Labs/tests ordered: None   Next Appointment: Return if symptoms worsen or fail to improve.   Total time: 20 minutes.Greater than 50% of total time spent doing patient education regarding Acute urinary tract infection,HTN,health maintenance including symptom/medication management.   Roxan JAYSON Plough, NP    [1]  Allergies Allergen Reactions   Repatha  [Evolocumab ] Itching   Crestor  [Rosuvastatin ]     MYALGIA    Pravastatin     Statins Other (See Comments)    Muscle pain; Tolerates Crestor     "

## 2024-08-18 ENCOUNTER — Ambulatory Visit: Payer: Self-pay | Admitting: Family

## 2024-08-26 ENCOUNTER — Ambulatory Visit

## 2024-08-29 ENCOUNTER — Ambulatory Visit (HOSPITAL_BASED_OUTPATIENT_CLINIC_OR_DEPARTMENT_OTHER): Admitting: Cardiology

## 2024-09-02 ENCOUNTER — Ambulatory Visit

## 2024-09-16 ENCOUNTER — Ambulatory Visit (HOSPITAL_BASED_OUTPATIENT_CLINIC_OR_DEPARTMENT_OTHER): Admitting: Family

## 2024-09-28 ENCOUNTER — Ambulatory Visit: Admitting: Rheumatology

## 2024-11-01 ENCOUNTER — Ambulatory Visit: Payer: Self-pay | Admitting: Family

## 2025-01-12 ENCOUNTER — Ambulatory Visit: Admitting: Neurology
